# Patient Record
Sex: Female | Born: 1970 | Race: White | Hispanic: No | Marital: Single | Smoking: Never smoker
Health system: Southern US, Community
[De-identification: ages and names within clinical notes are randomized; demographics above are authoritative.]

## PROBLEM LIST (undated history)

## (undated) ENCOUNTER — Ambulatory Visit (HOSPITAL_COMMUNITY): Admission: EM | Source: Home / Self Care

## (undated) VITALS — BP 117/81 | HR 92 | Temp 98.1°F | Resp 19 | Ht 70.0 in | Wt 213.0 lb

## (undated) DIAGNOSIS — H269 Unspecified cataract: Secondary | ICD-10-CM

## (undated) DIAGNOSIS — F609 Personality disorder, unspecified: Secondary | ICD-10-CM

## (undated) DIAGNOSIS — R079 Chest pain, unspecified: Secondary | ICD-10-CM

## (undated) DIAGNOSIS — G894 Chronic pain syndrome: Secondary | ICD-10-CM

## (undated) DIAGNOSIS — I10 Essential (primary) hypertension: Secondary | ICD-10-CM

## (undated) DIAGNOSIS — L309 Dermatitis, unspecified: Secondary | ICD-10-CM

## (undated) DIAGNOSIS — M25519 Pain in unspecified shoulder: Secondary | ICD-10-CM

## (undated) DIAGNOSIS — Z87820 Personal history of traumatic brain injury: Secondary | ICD-10-CM

## (undated) DIAGNOSIS — F319 Bipolar disorder, unspecified: Secondary | ICD-10-CM

## (undated) DIAGNOSIS — R270 Ataxia, unspecified: Secondary | ICD-10-CM

## (undated) DIAGNOSIS — M1990 Unspecified osteoarthritis, unspecified site: Secondary | ICD-10-CM

## (undated) DIAGNOSIS — M2140 Flat foot [pes planus] (acquired), unspecified foot: Secondary | ICD-10-CM

## (undated) DIAGNOSIS — R5383 Other fatigue: Secondary | ICD-10-CM

## (undated) DIAGNOSIS — Z800 Family history of malignant neoplasm of digestive organs: Secondary | ICD-10-CM

## (undated) DIAGNOSIS — R0602 Shortness of breath: Secondary | ICD-10-CM

## (undated) DIAGNOSIS — M549 Dorsalgia, unspecified: Secondary | ICD-10-CM

## (undated) DIAGNOSIS — M6740 Ganglion, unspecified site: Secondary | ICD-10-CM

## (undated) DIAGNOSIS — M7660 Achilles tendinitis, unspecified leg: Secondary | ICD-10-CM

## (undated) DIAGNOSIS — Z803 Family history of malignant neoplasm of breast: Secondary | ICD-10-CM

## (undated) DIAGNOSIS — E221 Hyperprolactinemia: Secondary | ICD-10-CM

## (undated) DIAGNOSIS — T7840XA Allergy, unspecified, initial encounter: Secondary | ICD-10-CM

## (undated) DIAGNOSIS — R42 Dizziness and giddiness: Secondary | ICD-10-CM

## (undated) DIAGNOSIS — F900 Attention-deficit hyperactivity disorder, predominantly inattentive type: Secondary | ICD-10-CM

## (undated) DIAGNOSIS — F909 Attention-deficit hyperactivity disorder, unspecified type: Secondary | ICD-10-CM

## (undated) DIAGNOSIS — M5430 Sciatica, unspecified side: Secondary | ICD-10-CM

## (undated) DIAGNOSIS — J302 Other seasonal allergic rhinitis: Secondary | ICD-10-CM

## (undated) DIAGNOSIS — M2550 Pain in unspecified joint: Secondary | ICD-10-CM

## (undated) DIAGNOSIS — Z8481 Family history of carrier of genetic disease: Secondary | ICD-10-CM

## (undated) HISTORY — DX: Family history of malignant neoplasm of breast: Z80.3

## (undated) HISTORY — DX: Personality disorder, unspecified: F60.9

## (undated) HISTORY — DX: Pain in unspecified joint: M25.50

## (undated) HISTORY — DX: Bipolar disorder, unspecified: F31.9

## (undated) HISTORY — DX: Achilles tendinitis, unspecified leg: M76.60

## (undated) HISTORY — DX: Dorsalgia, unspecified: M54.9

## (undated) HISTORY — DX: Other fatigue: R53.83

## (undated) HISTORY — DX: Attention-deficit hyperactivity disorder, unspecified type: F90.9

## (undated) HISTORY — DX: Family history of malignant neoplasm of digestive organs: Z80.0

## (undated) HISTORY — DX: Family history of carrier of genetic disease: Z84.81

## (undated) HISTORY — DX: Unspecified osteoarthritis, unspecified site: M19.90

## (undated) HISTORY — DX: Chest pain, unspecified: R07.9

## (undated) HISTORY — DX: Flat foot (pes planus) (acquired), unspecified foot: M21.40

## (undated) HISTORY — DX: Dizziness and giddiness: R42

## (undated) HISTORY — DX: Dermatitis, unspecified: L30.9

## (undated) HISTORY — DX: Essential (primary) hypertension: I10

## (undated) HISTORY — DX: Personal history of traumatic brain injury: Z87.820

## (undated) HISTORY — DX: Hyperprolactinemia: E22.1

## (undated) HISTORY — DX: Ataxia, unspecified: R27.0

## (undated) HISTORY — DX: Shortness of breath: R06.02

## (undated) HISTORY — DX: Allergy, unspecified, initial encounter: T78.40XA

## (undated) HISTORY — DX: Ganglion, unspecified site: M67.40

## (undated) HISTORY — DX: Attention-deficit hyperactivity disorder, predominantly inattentive type: F90.0

## (undated) HISTORY — DX: Sciatica, unspecified side: M54.30

## (undated) HISTORY — PX: OTHER SURGICAL HISTORY: SHX169

## (undated) HISTORY — DX: Chronic pain syndrome: G89.4

## (undated) HISTORY — DX: Pain in unspecified shoulder: M25.519

## (undated) HISTORY — DX: Other seasonal allergic rhinitis: J30.2

---
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
## 1998-06-01 ENCOUNTER — Inpatient Hospital Stay (HOSPITAL_COMMUNITY): Admission: EM | Admit: 1998-06-01 | Discharge: 1998-06-11 | Payer: Self-pay | Admitting: Emergency Medicine

## 1998-06-04 ENCOUNTER — Encounter: Payer: Self-pay | Admitting: Internal Medicine

## 1998-06-14 ENCOUNTER — Encounter (HOSPITAL_COMMUNITY): Admission: RE | Admit: 1998-06-14 | Discharge: 1998-06-23 | Payer: Self-pay

## 1998-07-30 ENCOUNTER — Inpatient Hospital Stay (HOSPITAL_COMMUNITY): Admission: EM | Admit: 1998-07-30 | Discharge: 1998-08-10 | Payer: Self-pay | Admitting: *Deleted

## 1998-09-13 ENCOUNTER — Ambulatory Visit (HOSPITAL_COMMUNITY): Admission: RE | Admit: 1998-09-13 | Discharge: 1998-09-13 | Payer: Self-pay | Admitting: Family Medicine

## 1999-02-14 ENCOUNTER — Encounter (HOSPITAL_COMMUNITY): Admission: RE | Admit: 1999-02-14 | Discharge: 1999-05-15 | Payer: Self-pay | Admitting: Neurology

## 1999-04-07 ENCOUNTER — Emergency Department (HOSPITAL_COMMUNITY): Admission: EM | Admit: 1999-04-07 | Discharge: 1999-04-07 | Payer: Self-pay | Admitting: Emergency Medicine

## 1999-04-15 ENCOUNTER — Encounter: Admission: RE | Admit: 1999-04-15 | Discharge: 1999-04-15 | Payer: Self-pay | Admitting: Family Medicine

## 1999-04-15 ENCOUNTER — Encounter: Payer: Self-pay | Admitting: Family Medicine

## 1999-07-04 ENCOUNTER — Emergency Department (HOSPITAL_COMMUNITY): Admission: EM | Admit: 1999-07-04 | Discharge: 1999-07-04 | Payer: Self-pay | Admitting: Emergency Medicine

## 2000-09-17 ENCOUNTER — Encounter: Payer: Self-pay | Admitting: Family Medicine

## 2000-09-17 ENCOUNTER — Encounter: Admission: RE | Admit: 2000-09-17 | Discharge: 2000-09-17 | Payer: Self-pay | Admitting: Family Medicine

## 2001-03-15 ENCOUNTER — Encounter: Payer: Self-pay | Admitting: Family Medicine

## 2001-03-15 ENCOUNTER — Encounter: Admission: RE | Admit: 2001-03-15 | Discharge: 2001-03-15 | Payer: Self-pay | Admitting: Family Medicine

## 2003-05-05 ENCOUNTER — Other Ambulatory Visit: Admission: RE | Admit: 2003-05-05 | Discharge: 2003-05-05 | Payer: Self-pay | Admitting: Family Medicine

## 2003-11-25 ENCOUNTER — Emergency Department (HOSPITAL_COMMUNITY): Admission: EM | Admit: 2003-11-25 | Discharge: 2003-11-25 | Payer: Self-pay | Admitting: Emergency Medicine

## 2004-08-27 ENCOUNTER — Ambulatory Visit: Payer: Self-pay | Admitting: Psychiatry

## 2004-08-27 ENCOUNTER — Inpatient Hospital Stay (HOSPITAL_COMMUNITY): Admission: RE | Admit: 2004-08-27 | Discharge: 2004-08-31 | Payer: Self-pay | Admitting: Psychiatry

## 2004-11-24 ENCOUNTER — Inpatient Hospital Stay (HOSPITAL_COMMUNITY): Admission: RE | Admit: 2004-11-24 | Discharge: 2004-11-30 | Payer: Self-pay | Admitting: Psychiatry

## 2004-11-25 ENCOUNTER — Ambulatory Visit: Payer: Self-pay | Admitting: Psychiatry

## 2004-12-06 ENCOUNTER — Inpatient Hospital Stay (HOSPITAL_COMMUNITY): Admission: RE | Admit: 2004-12-06 | Discharge: 2004-12-12 | Payer: Self-pay | Admitting: Psychiatry

## 2004-12-07 ENCOUNTER — Ambulatory Visit: Payer: Self-pay | Admitting: Psychiatry

## 2004-12-16 ENCOUNTER — Emergency Department (HOSPITAL_COMMUNITY): Admission: EM | Admit: 2004-12-16 | Discharge: 2004-12-17 | Payer: Self-pay | Admitting: Emergency Medicine

## 2004-12-28 ENCOUNTER — Inpatient Hospital Stay (HOSPITAL_COMMUNITY): Admission: RE | Admit: 2004-12-28 | Discharge: 2005-01-02 | Payer: Self-pay | Admitting: Psychiatry

## 2004-12-30 ENCOUNTER — Ambulatory Visit: Payer: Self-pay | Admitting: Psychiatry

## 2005-11-27 ENCOUNTER — Inpatient Hospital Stay (HOSPITAL_COMMUNITY): Admission: RE | Admit: 2005-11-27 | Discharge: 2005-12-01 | Payer: Self-pay | Admitting: Psychiatry

## 2005-11-27 ENCOUNTER — Ambulatory Visit: Payer: Self-pay | Admitting: Psychiatry

## 2005-11-27 ENCOUNTER — Emergency Department (HOSPITAL_COMMUNITY): Admission: EM | Admit: 2005-11-27 | Discharge: 2005-11-27 | Payer: Self-pay | Admitting: Emergency Medicine

## 2005-12-27 ENCOUNTER — Ambulatory Visit (HOSPITAL_COMMUNITY): Payer: Self-pay | Admitting: Psychiatry

## 2006-02-06 ENCOUNTER — Ambulatory Visit (HOSPITAL_BASED_OUTPATIENT_CLINIC_OR_DEPARTMENT_OTHER): Admission: RE | Admit: 2006-02-06 | Discharge: 2006-02-06 | Payer: Self-pay | Admitting: Neurology

## 2006-03-07 ENCOUNTER — Ambulatory Visit (HOSPITAL_COMMUNITY): Payer: Self-pay | Admitting: Psychiatry

## 2006-04-11 ENCOUNTER — Ambulatory Visit (HOSPITAL_COMMUNITY): Payer: Self-pay | Admitting: Psychiatry

## 2006-05-09 ENCOUNTER — Ambulatory Visit (HOSPITAL_COMMUNITY): Payer: Self-pay | Admitting: Psychiatry

## 2006-06-13 ENCOUNTER — Ambulatory Visit (HOSPITAL_COMMUNITY): Payer: Self-pay | Admitting: Psychiatry

## 2006-06-29 ENCOUNTER — Ambulatory Visit: Payer: Self-pay | Admitting: Psychiatry

## 2006-06-29 ENCOUNTER — Inpatient Hospital Stay (HOSPITAL_COMMUNITY): Admission: RE | Admit: 2006-06-29 | Discharge: 2006-07-03 | Payer: Self-pay | Admitting: Psychiatry

## 2006-07-18 ENCOUNTER — Ambulatory Visit (HOSPITAL_COMMUNITY): Payer: Self-pay | Admitting: Psychiatry

## 2006-07-20 ENCOUNTER — Inpatient Hospital Stay (HOSPITAL_COMMUNITY): Admission: AD | Admit: 2006-07-20 | Discharge: 2006-07-25 | Payer: Self-pay | Admitting: Psychiatry

## 2006-07-28 ENCOUNTER — Inpatient Hospital Stay (HOSPITAL_COMMUNITY): Admission: AD | Admit: 2006-07-28 | Discharge: 2006-08-03 | Payer: Self-pay | Admitting: Psychiatry

## 2006-08-23 ENCOUNTER — Ambulatory Visit: Payer: Self-pay | Admitting: Psychiatry

## 2006-08-23 ENCOUNTER — Inpatient Hospital Stay (HOSPITAL_COMMUNITY): Admission: AD | Admit: 2006-08-23 | Discharge: 2006-08-29 | Payer: Self-pay | Admitting: Psychiatry

## 2006-08-29 ENCOUNTER — Inpatient Hospital Stay: Payer: Self-pay | Admitting: Unknown Physician Specialty

## 2006-09-12 ENCOUNTER — Ambulatory Visit: Payer: Self-pay | Admitting: Unknown Physician Specialty

## 2006-09-26 ENCOUNTER — Inpatient Hospital Stay (HOSPITAL_COMMUNITY): Admission: EM | Admit: 2006-09-26 | Discharge: 2006-10-09 | Payer: Self-pay | Admitting: *Deleted

## 2006-09-26 ENCOUNTER — Emergency Department (HOSPITAL_COMMUNITY): Admission: EM | Admit: 2006-09-26 | Discharge: 2006-09-26 | Payer: Self-pay | Admitting: Emergency Medicine

## 2006-09-28 ENCOUNTER — Ambulatory Visit (HOSPITAL_COMMUNITY): Admission: RE | Admit: 2006-09-28 | Discharge: 2006-09-28 | Payer: Self-pay | Admitting: *Deleted

## 2006-10-01 ENCOUNTER — Ambulatory Visit: Payer: Self-pay | Admitting: Unknown Physician Specialty

## 2006-11-16 ENCOUNTER — Other Ambulatory Visit (HOSPITAL_COMMUNITY): Admission: RE | Admit: 2006-11-16 | Discharge: 2007-02-14 | Payer: Self-pay | Admitting: Psychiatry

## 2006-12-12 ENCOUNTER — Ambulatory Visit (HOSPITAL_COMMUNITY): Payer: Self-pay | Admitting: Psychiatry

## 2007-01-10 ENCOUNTER — Encounter: Admission: RE | Admit: 2007-01-10 | Discharge: 2007-01-10 | Payer: Self-pay | Admitting: *Deleted

## 2007-01-11 ENCOUNTER — Emergency Department (HOSPITAL_COMMUNITY): Admission: EM | Admit: 2007-01-11 | Discharge: 2007-01-11 | Payer: Self-pay | Admitting: Emergency Medicine

## 2007-01-16 ENCOUNTER — Encounter: Admission: RE | Admit: 2007-01-16 | Discharge: 2007-01-16 | Payer: Self-pay | Admitting: *Deleted

## 2007-01-28 ENCOUNTER — Encounter: Admission: RE | Admit: 2007-01-28 | Discharge: 2007-01-28 | Payer: Self-pay | Admitting: *Deleted

## 2007-01-28 ENCOUNTER — Encounter (INDEPENDENT_AMBULATORY_CARE_PROVIDER_SITE_OTHER): Payer: Self-pay | Admitting: Diagnostic Radiology

## 2007-01-30 ENCOUNTER — Encounter (INDEPENDENT_AMBULATORY_CARE_PROVIDER_SITE_OTHER): Payer: Self-pay | Admitting: Interventional Cardiology

## 2007-01-30 ENCOUNTER — Ambulatory Visit (HOSPITAL_COMMUNITY): Admission: RE | Admit: 2007-01-30 | Discharge: 2007-01-30 | Payer: Self-pay | Admitting: Interventional Cardiology

## 2007-01-30 ENCOUNTER — Emergency Department (HOSPITAL_COMMUNITY): Admission: EM | Admit: 2007-01-30 | Discharge: 2007-01-30 | Payer: Self-pay | Admitting: Emergency Medicine

## 2007-02-14 ENCOUNTER — Emergency Department (HOSPITAL_COMMUNITY): Admission: EM | Admit: 2007-02-14 | Discharge: 2007-02-14 | Payer: Self-pay | Admitting: Emergency Medicine

## 2007-02-20 ENCOUNTER — Ambulatory Visit (HOSPITAL_COMMUNITY): Payer: Self-pay | Admitting: Psychiatry

## 2007-02-25 ENCOUNTER — Ambulatory Visit: Payer: Self-pay | Admitting: Psychiatry

## 2007-02-25 ENCOUNTER — Inpatient Hospital Stay (HOSPITAL_COMMUNITY): Admission: AD | Admit: 2007-02-25 | Discharge: 2007-03-07 | Payer: Self-pay | Admitting: Psychiatry

## 2007-03-20 ENCOUNTER — Inpatient Hospital Stay (HOSPITAL_COMMUNITY): Admission: AD | Admit: 2007-03-20 | Discharge: 2007-04-01 | Payer: Self-pay | Admitting: Psychiatry

## 2007-04-04 ENCOUNTER — Emergency Department (HOSPITAL_COMMUNITY): Admission: EM | Admit: 2007-04-04 | Discharge: 2007-04-04 | Payer: Self-pay | Admitting: Family Medicine

## 2007-04-24 ENCOUNTER — Ambulatory Visit: Payer: Self-pay | Admitting: Internal Medicine

## 2007-04-25 ENCOUNTER — Ambulatory Visit: Payer: Self-pay | Admitting: *Deleted

## 2007-04-26 ENCOUNTER — Emergency Department (HOSPITAL_COMMUNITY): Admission: EM | Admit: 2007-04-26 | Discharge: 2007-04-26 | Payer: Self-pay | Admitting: Family Medicine

## 2007-05-01 ENCOUNTER — Ambulatory Visit (HOSPITAL_COMMUNITY): Payer: Self-pay | Admitting: Psychiatry

## 2007-05-13 ENCOUNTER — Encounter: Admission: RE | Admit: 2007-05-13 | Discharge: 2007-06-14 | Payer: Self-pay | Admitting: Family Medicine

## 2007-06-04 ENCOUNTER — Emergency Department (HOSPITAL_COMMUNITY): Admission: EM | Admit: 2007-06-04 | Discharge: 2007-06-04 | Payer: Self-pay | Admitting: Emergency Medicine

## 2007-06-05 ENCOUNTER — Ambulatory Visit (HOSPITAL_COMMUNITY): Payer: Self-pay | Admitting: Psychiatry

## 2007-07-09 ENCOUNTER — Encounter (INDEPENDENT_AMBULATORY_CARE_PROVIDER_SITE_OTHER): Payer: Self-pay | Admitting: Nurse Practitioner

## 2007-07-09 ENCOUNTER — Ambulatory Visit: Payer: Self-pay | Admitting: Internal Medicine

## 2007-07-09 LAB — CONVERTED CEMR LAB
ALT: 18 units/L (ref 0–35)
AST: 18 units/L (ref 0–37)
Albumin: 4.4 g/dL (ref 3.5–5.2)
Alkaline Phosphatase: 80 units/L (ref 39–117)
BUN: 8 mg/dL (ref 6–23)
Basophils Absolute: 0 10*3/uL (ref 0.0–0.1)
Basophils Relative: 1 % (ref 0–1)
CO2: 26 meq/L (ref 19–32)
Calcium: 9.2 mg/dL (ref 8.4–10.5)
Chloride: 102 meq/L (ref 96–112)
Creatinine, Ser: 0.8 mg/dL (ref 0.40–1.20)
Eosinophils Absolute: 0.2 10*3/uL (ref 0.0–0.7)
Eosinophils Relative: 4 % (ref 0–5)
Glucose, Bld: 133 mg/dL — ABNORMAL HIGH (ref 70–99)
HCT: 41.2 % (ref 36.0–46.0)
Hemoglobin: 13.3 g/dL (ref 12.0–15.0)
Lymphocytes Relative: 33 % (ref 12–46)
Lymphs Abs: 1.7 10*3/uL (ref 0.7–4.0)
MCHC: 32.3 g/dL (ref 30.0–36.0)
MCV: 93.2 fL (ref 78.0–100.0)
Monocytes Absolute: 0.5 10*3/uL (ref 0.1–1.0)
Monocytes Relative: 10 % (ref 3–12)
Neutro Abs: 2.7 10*3/uL (ref 1.7–7.7)
Neutrophils Relative %: 52 % (ref 43–77)
Platelets: 237 10*3/uL (ref 150–400)
Potassium: 4.5 meq/L (ref 3.5–5.3)
RBC: 4.42 M/uL (ref 3.87–5.11)
RDW: 13 % (ref 11.5–15.5)
Sodium: 139 meq/L (ref 135–145)
Total Bilirubin: 0.3 mg/dL (ref 0.3–1.2)
Total Protein: 6.5 g/dL (ref 6.0–8.3)
WBC: 5.2 10*3/uL (ref 4.0–10.5)

## 2007-07-10 ENCOUNTER — Ambulatory Visit (HOSPITAL_COMMUNITY): Payer: Self-pay | Admitting: Psychiatry

## 2007-07-11 ENCOUNTER — Encounter (INDEPENDENT_AMBULATORY_CARE_PROVIDER_SITE_OTHER): Payer: Self-pay | Admitting: Nurse Practitioner

## 2007-07-11 LAB — CONVERTED CEMR LAB: Hgb A1c MFr Bld: 5.3 % (ref 4.6–6.1)

## 2007-07-18 ENCOUNTER — Encounter: Admission: RE | Admit: 2007-07-18 | Discharge: 2007-07-18 | Payer: Self-pay | Admitting: Family Medicine

## 2007-08-07 ENCOUNTER — Ambulatory Visit (HOSPITAL_COMMUNITY): Payer: Self-pay | Admitting: Psychiatry

## 2007-09-04 ENCOUNTER — Ambulatory Visit (HOSPITAL_COMMUNITY): Payer: Self-pay | Admitting: Psychiatry

## 2008-04-29 ENCOUNTER — Ambulatory Visit: Payer: Self-pay | Admitting: *Deleted

## 2008-04-29 ENCOUNTER — Inpatient Hospital Stay (HOSPITAL_COMMUNITY): Admission: RE | Admit: 2008-04-29 | Discharge: 2008-05-06 | Payer: Self-pay | Admitting: *Deleted

## 2008-05-15 ENCOUNTER — Encounter: Admission: RE | Admit: 2008-05-15 | Discharge: 2008-05-15 | Payer: Self-pay | Admitting: Family Medicine

## 2008-05-22 ENCOUNTER — Encounter: Admission: RE | Admit: 2008-05-22 | Discharge: 2008-05-22 | Payer: Self-pay | Admitting: Family Medicine

## 2008-06-03 ENCOUNTER — Ambulatory Visit: Payer: Self-pay | Admitting: Sports Medicine

## 2008-06-03 DIAGNOSIS — M7660 Achilles tendinitis, unspecified leg: Secondary | ICD-10-CM | POA: Insufficient documentation

## 2008-06-05 ENCOUNTER — Telehealth (INDEPENDENT_AMBULATORY_CARE_PROVIDER_SITE_OTHER): Payer: Self-pay | Admitting: *Deleted

## 2008-06-06 ENCOUNTER — Emergency Department (HOSPITAL_COMMUNITY): Admission: EM | Admit: 2008-06-06 | Discharge: 2008-06-06 | Payer: Self-pay | Admitting: Emergency Medicine

## 2008-06-09 ENCOUNTER — Ambulatory Visit: Payer: Self-pay | Admitting: Sports Medicine

## 2008-06-09 DIAGNOSIS — F319 Bipolar disorder, unspecified: Secondary | ICD-10-CM | POA: Insufficient documentation

## 2008-07-01 ENCOUNTER — Ambulatory Visit: Payer: Self-pay | Admitting: Sports Medicine

## 2008-07-01 DIAGNOSIS — M216X9 Other acquired deformities of unspecified foot: Secondary | ICD-10-CM | POA: Insufficient documentation

## 2008-07-01 DIAGNOSIS — M2140 Flat foot [pes planus] (acquired), unspecified foot: Secondary | ICD-10-CM | POA: Insufficient documentation

## 2008-08-26 ENCOUNTER — Ambulatory Visit: Payer: Self-pay | Admitting: Sports Medicine

## 2008-10-09 ENCOUNTER — Inpatient Hospital Stay (HOSPITAL_COMMUNITY): Admission: AD | Admit: 2008-10-09 | Discharge: 2008-10-15 | Payer: Self-pay | Admitting: *Deleted

## 2008-10-09 ENCOUNTER — Ambulatory Visit: Payer: Self-pay | Admitting: *Deleted

## 2008-10-27 ENCOUNTER — Ambulatory Visit: Payer: Self-pay | Admitting: Sports Medicine

## 2008-12-10 ENCOUNTER — Ambulatory Visit: Payer: Self-pay | Admitting: Sports Medicine

## 2008-12-23 ENCOUNTER — Ambulatory Visit: Payer: Self-pay | Admitting: Gynecology

## 2008-12-28 ENCOUNTER — Emergency Department (HOSPITAL_COMMUNITY): Admission: EM | Admit: 2008-12-28 | Discharge: 2008-12-28 | Payer: Self-pay | Admitting: Emergency Medicine

## 2008-12-30 ENCOUNTER — Ambulatory Visit (HOSPITAL_COMMUNITY): Payer: Self-pay | Admitting: Psychiatry

## 2009-01-07 ENCOUNTER — Ambulatory Visit: Payer: Self-pay | Admitting: Gynecology

## 2009-01-11 ENCOUNTER — Ambulatory Visit: Payer: Self-pay | Admitting: Gynecology

## 2009-01-11 ENCOUNTER — Ambulatory Visit (HOSPITAL_BASED_OUTPATIENT_CLINIC_OR_DEPARTMENT_OTHER): Admission: RE | Admit: 2009-01-11 | Discharge: 2009-01-11 | Payer: Self-pay | Admitting: Gynecology

## 2009-01-18 ENCOUNTER — Ambulatory Visit: Payer: Self-pay | Admitting: Gynecology

## 2009-01-19 ENCOUNTER — Ambulatory Visit: Payer: Self-pay | Admitting: Gynecology

## 2009-01-30 HISTORY — PX: GANGLION CYST EXCISION: SHX1691

## 2009-02-02 ENCOUNTER — Encounter: Admission: RE | Admit: 2009-02-02 | Discharge: 2009-02-02 | Payer: Self-pay | Admitting: Gynecology

## 2009-02-03 ENCOUNTER — Ambulatory Visit: Payer: Self-pay | Admitting: Gynecology

## 2009-04-01 ENCOUNTER — Emergency Department (HOSPITAL_COMMUNITY): Admission: EM | Admit: 2009-04-01 | Discharge: 2009-04-01 | Payer: Self-pay | Admitting: Emergency Medicine

## 2009-04-01 ENCOUNTER — Ambulatory Visit: Payer: Self-pay | Admitting: Gynecology

## 2009-04-06 ENCOUNTER — Ambulatory Visit: Payer: Self-pay | Admitting: Sports Medicine

## 2009-04-08 ENCOUNTER — Inpatient Hospital Stay (HOSPITAL_COMMUNITY): Admission: RE | Admit: 2009-04-08 | Discharge: 2009-04-16 | Payer: Self-pay | Admitting: Psychiatry

## 2009-04-08 ENCOUNTER — Ambulatory Visit: Payer: Self-pay | Admitting: Psychiatry

## 2009-04-27 ENCOUNTER — Ambulatory Visit: Payer: Self-pay | Admitting: Sports Medicine

## 2009-05-26 ENCOUNTER — Ambulatory Visit: Payer: Self-pay | Admitting: Sports Medicine

## 2009-06-01 ENCOUNTER — Ambulatory Visit: Payer: Self-pay | Admitting: Gynecology

## 2009-06-03 ENCOUNTER — Ambulatory Visit: Payer: Self-pay | Admitting: Psychiatry

## 2009-06-03 ENCOUNTER — Inpatient Hospital Stay (HOSPITAL_COMMUNITY): Admission: RE | Admit: 2009-06-03 | Discharge: 2009-06-14 | Payer: Self-pay | Admitting: Psychiatry

## 2009-06-10 ENCOUNTER — Emergency Department (HOSPITAL_COMMUNITY): Admission: EM | Admit: 2009-06-10 | Discharge: 2009-06-10 | Payer: Self-pay | Admitting: Emergency Medicine

## 2009-06-16 ENCOUNTER — Other Ambulatory Visit (HOSPITAL_COMMUNITY): Admission: RE | Admit: 2009-06-16 | Discharge: 2009-06-24 | Payer: Self-pay | Admitting: Psychiatry

## 2009-06-18 ENCOUNTER — Emergency Department (HOSPITAL_COMMUNITY): Admission: EM | Admit: 2009-06-18 | Discharge: 2009-06-19 | Payer: Self-pay | Admitting: Emergency Medicine

## 2009-06-19 ENCOUNTER — Inpatient Hospital Stay (HOSPITAL_COMMUNITY): Admission: RE | Admit: 2009-06-19 | Discharge: 2009-06-22 | Payer: Self-pay | Admitting: Psychiatry

## 2009-06-24 ENCOUNTER — Ambulatory Visit: Payer: Self-pay | Admitting: Gynecology

## 2009-06-25 ENCOUNTER — Encounter: Admission: RE | Admit: 2009-06-25 | Discharge: 2009-06-25 | Payer: Self-pay | Admitting: Gynecology

## 2009-07-09 ENCOUNTER — Ambulatory Visit: Payer: Self-pay | Admitting: Family Medicine

## 2009-07-21 ENCOUNTER — Ambulatory Visit (HOSPITAL_COMMUNITY): Payer: Self-pay | Admitting: Psychiatry

## 2009-07-28 ENCOUNTER — Ambulatory Visit (HOSPITAL_COMMUNITY): Payer: Self-pay | Admitting: Psychiatry

## 2009-07-30 ENCOUNTER — Ambulatory Visit: Payer: Self-pay | Admitting: Family Medicine

## 2009-07-30 DIAGNOSIS — S93409A Sprain of unspecified ligament of unspecified ankle, initial encounter: Secondary | ICD-10-CM | POA: Insufficient documentation

## 2009-08-04 ENCOUNTER — Ambulatory Visit (HOSPITAL_COMMUNITY): Payer: Self-pay | Admitting: Psychiatry

## 2009-08-05 ENCOUNTER — Emergency Department (HOSPITAL_COMMUNITY): Admission: EM | Admit: 2009-08-05 | Discharge: 2009-08-06 | Payer: Self-pay | Admitting: Emergency Medicine

## 2009-08-05 ENCOUNTER — Ambulatory Visit (HOSPITAL_COMMUNITY): Admission: RE | Admit: 2009-08-05 | Discharge: 2009-08-05 | Payer: Self-pay | Admitting: Psychiatry

## 2009-08-06 ENCOUNTER — Ambulatory Visit: Payer: Self-pay | Admitting: Psychiatry

## 2009-08-06 ENCOUNTER — Inpatient Hospital Stay (HOSPITAL_COMMUNITY): Admission: AD | Admit: 2009-08-06 | Discharge: 2009-08-23 | Payer: Self-pay | Admitting: Psychiatry

## 2009-08-26 ENCOUNTER — Encounter: Admission: RE | Admit: 2009-08-26 | Discharge: 2009-08-26 | Payer: Self-pay | Admitting: Family Medicine

## 2009-08-27 ENCOUNTER — Ambulatory Visit: Payer: Self-pay | Admitting: Family Medicine

## 2009-08-27 DIAGNOSIS — M25539 Pain in unspecified wrist: Secondary | ICD-10-CM | POA: Insufficient documentation

## 2009-09-07 ENCOUNTER — Ambulatory Visit: Payer: Self-pay | Admitting: Family Medicine

## 2009-09-08 ENCOUNTER — Ambulatory Visit: Payer: Self-pay | Admitting: Gynecology

## 2009-09-09 ENCOUNTER — Encounter: Admission: RE | Admit: 2009-09-09 | Discharge: 2009-09-09 | Payer: Self-pay | Admitting: Family Medicine

## 2009-09-24 ENCOUNTER — Ambulatory Visit: Payer: Self-pay | Admitting: Gynecology

## 2009-09-27 ENCOUNTER — Ambulatory Visit: Payer: Self-pay | Admitting: Family Medicine

## 2009-09-27 DIAGNOSIS — L2089 Other atopic dermatitis: Secondary | ICD-10-CM | POA: Insufficient documentation

## 2009-09-28 ENCOUNTER — Encounter: Payer: Self-pay | Admitting: Family Medicine

## 2009-09-29 DIAGNOSIS — M6740 Ganglion, unspecified site: Secondary | ICD-10-CM | POA: Insufficient documentation

## 2009-09-29 HISTORY — DX: Ganglion, unspecified site: M67.40

## 2009-10-11 ENCOUNTER — Encounter: Payer: Self-pay | Admitting: Family Medicine

## 2009-10-11 ENCOUNTER — Emergency Department (HOSPITAL_COMMUNITY): Admission: EM | Admit: 2009-10-11 | Discharge: 2009-10-11 | Payer: Self-pay | Admitting: Emergency Medicine

## 2009-10-15 ENCOUNTER — Ambulatory Visit: Payer: Self-pay | Admitting: Family Medicine

## 2009-11-17 ENCOUNTER — Encounter
Admission: RE | Admit: 2009-11-17 | Discharge: 2010-01-19 | Payer: Self-pay | Source: Home / Self Care | Attending: Neurology | Admitting: Neurology

## 2009-11-29 ENCOUNTER — Ambulatory Visit: Payer: Self-pay | Admitting: Family Medicine

## 2009-12-06 ENCOUNTER — Ambulatory Visit: Payer: Self-pay | Admitting: Women's Health

## 2009-12-22 ENCOUNTER — Ambulatory Visit (HOSPITAL_COMMUNITY)
Admission: RE | Admit: 2009-12-22 | Discharge: 2009-12-22 | Payer: Self-pay | Source: Home / Self Care | Admitting: Surgery

## 2010-01-28 ENCOUNTER — Ambulatory Visit
Admission: RE | Admit: 2010-01-28 | Discharge: 2010-01-28 | Payer: Self-pay | Source: Home / Self Care | Attending: Women's Health | Admitting: Women's Health

## 2010-01-31 ENCOUNTER — Ambulatory Visit
Admission: RE | Admit: 2010-01-31 | Discharge: 2010-01-31 | Payer: Self-pay | Source: Home / Self Care | Attending: Gynecology | Admitting: Gynecology

## 2010-01-31 ENCOUNTER — Other Ambulatory Visit
Admission: RE | Admit: 2010-01-31 | Discharge: 2010-01-31 | Payer: Self-pay | Source: Home / Self Care | Admitting: Gynecology

## 2010-02-04 ENCOUNTER — Ambulatory Visit
Admission: RE | Admit: 2010-02-04 | Discharge: 2010-02-04 | Payer: Self-pay | Source: Home / Self Care | Attending: Family Medicine | Admitting: Family Medicine

## 2010-02-04 DIAGNOSIS — M25569 Pain in unspecified knee: Secondary | ICD-10-CM | POA: Insufficient documentation

## 2010-02-07 ENCOUNTER — Emergency Department (HOSPITAL_COMMUNITY)
Admission: EM | Admit: 2010-02-07 | Discharge: 2010-02-08 | Payer: Self-pay | Source: Home / Self Care | Admitting: Emergency Medicine

## 2010-02-09 ENCOUNTER — Ambulatory Visit
Admission: RE | Admit: 2010-02-09 | Discharge: 2010-02-09 | Payer: Self-pay | Source: Home / Self Care | Attending: Gynecology | Admitting: Gynecology

## 2010-02-14 LAB — BASIC METABOLIC PANEL
BUN: 12 mg/dL (ref 6–23)
CO2: 26 mEq/L (ref 19–32)
Calcium: 8.8 mg/dL (ref 8.4–10.5)
Chloride: 105 mEq/L (ref 96–112)
Creatinine, Ser: 1.03 mg/dL (ref 0.4–1.2)
GFR calc Af Amer: >60 mL/min (ref >60)
GFR calc non Af Amer: 59 mL/min — ABNORMAL LOW (ref >60)
Glucose, Bld: 106 mg/dL — ABNORMAL HIGH (ref 70–99)
Potassium: 3.8 mEq/L (ref 3.5–5.1)
Sodium: 141 mEq/L (ref 135–145)

## 2010-02-14 LAB — CBC
HCT: 38.7 % (ref 36.0–46.0)
Hemoglobin: 13.1 g/dL (ref 12.0–15.0)
MCH: 31.6 pg (ref 26.0–34.0)
MCHC: 33.9 g/dL (ref 30.0–36.0)
MCV: 93.3 fL (ref 78.0–100.0)
Platelets: 171 10*3/uL (ref 150–400)
RBC: 4.15 MIL/uL (ref 3.87–5.11)
RDW: 12.4 % (ref 11.5–15.5)
WBC: 7.6 10*3/uL (ref 4.0–10.5)

## 2010-02-14 LAB — WET PREP, GENITAL
Clue Cells Wet Prep HPF POC: NONE SEEN
Trich, Wet Prep: NONE SEEN
WBC, Wet Prep HPF POC: NONE SEEN
Yeast Wet Prep HPF POC: NONE SEEN

## 2010-02-14 LAB — DIFFERENTIAL
Basophils Absolute: 0 10*3/uL (ref 0.0–0.1)
Basophils Relative: 0 % (ref 0–1)
Eosinophils Absolute: 0.1 10*3/uL (ref 0.0–0.7)
Eosinophils Relative: 1 % (ref 0–5)
Lymphocytes Relative: 41 % (ref 12–46)
Lymphs Abs: 3.1 10*3/uL (ref 0.7–4.0)
Monocytes Absolute: 0.8 10*3/uL (ref 0.1–1.0)
Monocytes Relative: 10 % (ref 3–12)
Neutro Abs: 3.7 10*3/uL (ref 1.7–7.7)
Neutrophils Relative %: 48 % (ref 43–77)

## 2010-02-14 LAB — URINALYSIS, ROUTINE W REFLEX MICROSCOPIC
Bilirubin Urine: NEGATIVE
Hgb urine dipstick: NEGATIVE
Nitrite: NEGATIVE
Protein, ur: NEGATIVE mg/dL
Specific Gravity, Urine: 1.029 (ref 1.005–1.030)
Urine Glucose, Fasting: NEGATIVE mg/dL
Urobilinogen, UA: 0.2 mg/dL (ref 0.0–1.0)
pH: 7 (ref 5.0–8.0)

## 2010-02-14 LAB — GC/CHLAMYDIA PROBE AMP, GENITAL
Chlamydia, DNA Probe: NEGATIVE
GC Probe Amp, Genital: NEGATIVE

## 2010-02-14 LAB — URINE MICROSCOPIC-ADD ON

## 2010-02-14 LAB — POCT PREGNANCY, URINE: Preg Test, Ur: NEGATIVE

## 2010-02-15 ENCOUNTER — Emergency Department (HOSPITAL_COMMUNITY)
Admission: EM | Admit: 2010-02-15 | Discharge: 2010-02-15 | Payer: Self-pay | Source: Home / Self Care | Admitting: Emergency Medicine

## 2010-02-16 ENCOUNTER — Ambulatory Visit
Admission: RE | Admit: 2010-02-16 | Discharge: 2010-02-16 | Payer: Self-pay | Source: Home / Self Care | Attending: Gynecology | Admitting: Gynecology

## 2010-02-16 LAB — CBC
HCT: 38.9 % (ref 36.0–46.0)
Hemoglobin: 13.2 g/dL (ref 12.0–15.0)
MCH: 30.8 pg (ref 26.0–34.0)
MCHC: 33.9 g/dL (ref 30.0–36.0)
MCV: 90.9 fL (ref 78.0–100.0)
Platelets: 174 10*3/uL (ref 150–400)
RBC: 4.28 MIL/uL (ref 3.87–5.11)
RDW: 12.2 % (ref 11.5–15.5)
WBC: 7.7 10*3/uL (ref 4.0–10.5)

## 2010-02-16 LAB — URINALYSIS, ROUTINE W REFLEX MICROSCOPIC
Bilirubin Urine: NEGATIVE
Hgb urine dipstick: NEGATIVE
Ketones, ur: NEGATIVE mg/dL
Nitrite: NEGATIVE
Protein, ur: NEGATIVE mg/dL
Specific Gravity, Urine: 1.01 (ref 1.005–1.030)
Urine Glucose, Fasting: NEGATIVE mg/dL
Urobilinogen, UA: 0.2 mg/dL (ref 0.0–1.0)
pH: 7 (ref 5.0–8.0)

## 2010-02-16 LAB — DIFFERENTIAL
Basophils Absolute: 0 10*3/uL (ref 0.0–0.1)
Basophils Relative: 0 % (ref 0–1)
Eosinophils Absolute: 0.2 10*3/uL (ref 0.0–0.7)
Eosinophils Relative: 2 % (ref 0–5)
Lymphocytes Relative: 34 % (ref 12–46)
Lymphs Abs: 2.6 10*3/uL (ref 0.7–4.0)
Monocytes Absolute: 0.8 10*3/uL (ref 0.1–1.0)
Monocytes Relative: 10 % (ref 3–12)
Neutro Abs: 4.1 10*3/uL (ref 1.7–7.7)
Neutrophils Relative %: 53 % (ref 43–77)

## 2010-02-16 LAB — BASIC METABOLIC PANEL
BUN: 5 mg/dL — ABNORMAL LOW (ref 6–23)
CO2: 21 mEq/L (ref 19–32)
Calcium: 8.8 mg/dL (ref 8.4–10.5)
Chloride: 109 mEq/L (ref 96–112)
Creatinine, Ser: 1.02 mg/dL (ref 0.4–1.2)
GFR calc Af Amer: >60 mL/min (ref >60)
GFR calc non Af Amer: >60 mL/min (ref >60)
Glucose, Bld: 89 mg/dL (ref 70–99)
Potassium: 3.8 mEq/L (ref 3.5–5.1)
Sodium: 141 mEq/L (ref 135–145)

## 2010-02-16 LAB — POCT CARDIAC MARKERS
CKMB, poc: <1 ng/mL — ABNORMAL LOW (ref 1.0–8.0)
CKMB, poc: <1 ng/mL — ABNORMAL LOW (ref 1.0–8.0)
Myoglobin, poc: 41.5 ng/mL (ref 12–200)
Myoglobin, poc: 37.5 ng/mL (ref 12–200)
Troponin i, poc: <0.05 ng/mL (ref 0.00–0.09)
Troponin i, poc: <0.05 ng/mL (ref 0.00–0.09)

## 2010-02-16 LAB — HEPATIC FUNCTION PANEL
ALT: 21 U/L (ref 0–35)
AST: 23 U/L (ref 0–37)
Albumin: 3.2 g/dL — ABNORMAL LOW (ref 3.5–5.2)
Alkaline Phosphatase: 37 U/L — ABNORMAL LOW (ref 39–117)
Bilirubin, Direct: 0.2 mg/dL (ref 0.0–0.3)
Indirect Bilirubin: 0.4 mg/dL (ref 0.3–0.9)
Total Bilirubin: 0.6 mg/dL (ref 0.3–1.2)
Total Protein: 5.3 g/dL — ABNORMAL LOW (ref 6.0–8.3)

## 2010-02-16 LAB — URINE MICROSCOPIC-ADD ON

## 2010-02-16 LAB — VALPROIC ACID LEVEL: Valproic Acid Lvl: <10 ug/mL — ABNORMAL LOW (ref 50.0–100.0)

## 2010-02-16 LAB — LIPASE, BLOOD: Lipase: 18 U/L (ref 11–59)

## 2010-02-16 LAB — POCT PREGNANCY, URINE: Preg Test, Ur: NEGATIVE

## 2010-02-16 LAB — D-DIMER, QUANTITATIVE: D-Dimer, Quant: <0.22 ug/mL-FEU (ref 0.00–0.48)

## 2010-02-20 ENCOUNTER — Encounter: Payer: Self-pay | Admitting: Gynecology

## 2010-03-03 NOTE — Assessment & Plan Note (Signed)
Summary: fu achilles/jw   Vital Signs:  Patient profile:   40 year old female Pulse rate:   89 / minute BP sitting:   114 / 82  (right arm)  Vitals Entered By: Terese Door (August 26, 2008 10:51 AM) CC: f/u achilles   CC:  f/u achilles.  History of Present Illness: [name] has mostly good days now she can walk to her normal activities and classes without any left AT pain she does the exercises but not daily rare sharp pains in left AT no swelling feels that strength is good orhtotics take pressure off when she wears them as do some of her shoes  worried about involuntary twitching of third toe which sounds like some mm spasm or early cramping after hot weather exposure  Physical Exam  General:  Obese ,in no acute distress; alert,appropriate and cooperative throughout examination Msk:  no palpable tenderness over AT on left she seems to have normal diameter compared with RT able to walk and do heel raise with no limp or pain  repeat US Scan now the tendon has decreased to 0.46 thickness on LT this same area was 0.66 thickness in May no abnormal doppler activity transverse scan shows normal width and tendon structure no tears visualized   Impression & Recommendations:  Problem # 1:  ACHILLES TENDINITIS (ICD-726.71) This has resolved on clinical sxs and also structurally appears to have healed on MSUS  I recommended keeping up some maintanance AT exercises  wear orthotics and good shoes  reck if needed  Problem # 2:  PES PLANUS (ICD-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 Apply 3-4 times a day as needed disp qs 1 month

## 2010-03-03 NOTE — Assessment & Plan Note (Signed)
Summary: FU L ACHILLES PAIN   Vital Signs:  Patient profile:   40 year old female BP sitting:   120 / 84  Vitals Entered By: Lillia Pauls CMA (May 26, 2009 10:06 AM)  History of Present Illness: Reports to f/u left AT. No change since LOV. No routine performance of exercises. Using gel heel cups. Frequently walks [place] campus. Pain worst at the end of the day and on prolonged ambulation. Pain relieved by rest. No recent tearing sensations. Skin rx to voltaren gel and NTG patches; thus dc'ed. No past problems with oral NSAID.  Allergies: 1)  ! Pcn 2)  ! Prednisone 3)  ! Morphine  Physical Exam  General:  Well-developed,well-nourished,in no acute distress; alert,appropriate and cooperative throughout examination Msk:  ANKLES/FEET: Pes planus. Excessive Pronation. Full ROM/strength.  AT/CALVES: Normal Thompson's bilaterally. ttp along mid to distal AT on left with most ttp at retrocalcaneal bursa. No swelling, discoloration, or increased warmth. Normal nv exam.    Impression & Recommendations:  Problem # 1:  ACHILLES TENDINITIS (ICD-726.71) Extensively discussed importance of at least home rehabiliation. Declined formal PT as insurance will not cover PT sessions.  - D/c gel heel cups. - Felt heel wedges in sneakers for ambulatory activities. - Remaining plan per patient instrcutions. - RTC in 4 wks for repeat scan.  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  Complete Medication List: 1)  Risperdal M-tab 1 Mg Tbdp (Risperidone) .... 3mg  by mouth at bedtime and 1mg  bid 2)  Tegretol Xr 200 Mg Xr12h-tab (Carbamazepine) .... 2 tabs by mouth bid 3)  Trazodone Hcl 100 Mg Tabs (Trazodone hcl) .... 2 tabs po qhs 4)  Ambien 10 Mg Tabs (Zolpidem tartrate) .Marland Kitchen.. 1 po qhs 5)  Xanax 0.25 Mg Tabs (Alprazolam) .Marland Kitchen.. 1 tab by mouth bid 6)  Adderall 20 Mg Tabs (Amphetamine-dextroamphetamine) .Marland Kitchen.. 1 tab  po bid 7)  Loestrin 1.5/30 (21) 1.5-30 Mg-mcg Tabs (Norethindrone acet-ethinyl est) .Marland Kitchen.. 1 tab po daily (not sure of dosage) 8)  Cabergoline 0.5 Mg Tabs (Cabergoline) .... 1/2 tab by mouth twice a week 9)  Hydrochlorothiazide 25 Mg Tabs (Hydrochlorothiazide) .Marland Kitchen.. 1 tab po daily 10)  Toprol Xl 100 Mg Xr24h-tab (Metoprolol succinate) .Marland Kitchen.. 1 tab po daily 11)  Depakote Er 500 Mg Xr24h-tab (Divalproex sodium) .... 4 tabs po qhs 12)  Mobic 15 Mg Tabs (Meloxicam) .Marland Kitchen.. 1 tab by mouth daily  Patient Instructions: 1)  Heel Raises - 3 sets of 15 daily. 2)  Pidgeon-toe Heel Raises - 3 sets of 15 daily. 3)  Pidgeon-toe Walking - 10 times daily. 4)  Reverses Walking - 10 times daily. Prescriptions: MOBIC 15 MG TABS (MELOXICAM) 1 tab by mouth daily  #30 x 0   Entered and Authorized by:   Valarie Merino MD   Signed by:   Valarie Merino MD on 05/26/2009   Method used:   Print then Give to Patient   RxID:   [id number]

## 2010-03-03 NOTE — Assessment & Plan Note (Signed)
Summary: FU WRIST/MJD   Vital Signs:  Patient profile:   40 year old female Height:      69 inches BP sitting:   108 / 68  (left arm) Cuff size:   regular  Vitals Entered By: Tessie Fass CMA (August 27, 2009 9:20 AM) CC: F/U left wrist pain   CC:  F/U left wrist pain.  History of Present Illness: 40 yo female here with worsening left wrist pain.  Pt has had pain in her left wrist for about 6-8 weeks.  Tried a wrist splint before with good relief.  Has not been using the brace for the last 2 weeks due to recent hospitalization. pain is tingling and starts in her wrist and goes to all of her fingers.  Occasional sharp pains with certain movement.  Good stregnth  was recently in [facility] inpatient [facility]--her psych meds were changed  Habits & Providers  Alcohol-Tobacco-Diet     Tobacco Status: never  Current Medications (verified): 1)  Trazodone Hcl 100 Mg Tabs (Trazodone Hcl) .... 2 Tabs Po Qhs 2)  Junel 1.5/30 1.5-30 Mg-Mcg Tabs (Norethindrone Acet-Ethinyl Est) 3)  Hydrochlorothiazide 25 Mg Tabs (Hydrochlorothiazide) .Marland Kitchen.. 1 Tab Po Daily 4)  Toprol Xl 100 Mg Xr24h-Tab (Metoprolol Succinate) .Marland Kitchen.. 1 Tab Po Daily 5)  Psych Meds .... Per Behavioral Health  Allergies: 1)  ! Pcn 2)  ! Morphine  Social History: Smoking Status:  never  Review of Systems  The patient denies anorexia and fever.    Physical Exam  General:  alert, well-developed, and well-nourished.   Msk:  Left wrist: Pain with resisted extesnion--pain at radiocarpal joint line. Full flexionand extension stregnth 5/5 + Tinels, + finklestein's pain with resisted flexion no swelling, no redness strength hand and fingers normal. Additional Exam:  Patient given informed consent for injection. Discussed possible complications of infection, bleeding or skin atrophy at site of injection. Possible side effect of avascular necrosis (focal area of bone death) due to steroid use.Appropriate verbal time out taken  Are cleaned and prepped in usual sterile fashion. A ---1/2- cc kennalog plus -1/2---cc 1% lidocaine without epinephrine was injected into the-left wrist using dooral approach and Lister's tubercle as a landmark--. Patient tolerated procedure well with no complications.     Impression & Recommendations:  Problem # 1:  WRIST PAIN, LEFT (ICD-719.43) Assessment Deteriorated  Some pain in actual radiocarpal joint--not really a de quervains. -I recommended exercises and f/u in a month-but patient really wanted injection as she said it was too painful to do any exercises.-we went ahead and gave her  wrist injection, she will restart her brace and do HEP. Marland Kitchen wrist flexion and extension exercises demonstarted, pt given handout  Orders: Joint Aspirate / Injection, Intermediate (16109) Kenalog 10 mg inj (J3301)  Complete Medication List: 1)  Trazodone Hcl 100 Mg Tabs (Trazodone hcl) .... 2 tabs po qhs 2)  Junel 1.5/30 1.5-30 Mg-mcg Tabs (Norethindrone acet-ethinyl est) 3)  Hydrochlorothiazide 25 Mg Tabs (Hydrochlorothiazide) .Marland Kitchen.. 1 tab po daily 4)  Toprol Xl 100 Mg Xr24h-tab (Metoprolol succinate) .Marland Kitchen.. 1 tab po daily 5)  Psych Meds  .... Per behavioral health  Patient Instructions: 1)  complete exercises as I showed you.  Do they 15 times at least twice a day 2)  continue to wear your wrist brace 3)  ice your wrist 5-10 up to 5 times daily 4)  f/u in 3 weeks

## 2010-03-03 NOTE — Assessment & Plan Note (Signed)
Summary: achilles/kh   Vital Signs:  Patient profile:   40 year old female BP sitting:   122 / 88  Vitals Entered By: Lillia Pauls CMA (October 27, 2008 2:16 PM)  CC:  L achilles pain.  History of Present Illness: achilles: was doing better until about 1 week ago when started new exercise class at [facility].  doing circuit training with some step work and noticed significant pain in L achilles returning.  wearing her sneakers most of the time with the orthotics that were made for her.  denies swelling in the area but is having pain even with normal walking.  has tried heat and elevation without relief.  in past has done voltaren gel but caused her to break out in rash.  admits she hasn't been great about keeping up with heel exercises.   Physical Exam  General:  Obese ,in no acute distress; alert,appropriate and cooperative throughout examination Msk:  mild to moderate tenderness over L achilles all the way to insertion.  normal squeeze test. diameter again increased with ultrasound today to moderate degree with noted bursal swelling behind achilles.  no abnormal doppler activity.  thickness up to between 0.5-0.6 no tears visualized   Impression & Recommendations:  Problem # 1:  ACHILLES TENDINITIS (ICD-726.71) Assessment Deteriorated recurrent.  see pt instructions for plan of action.   this is recurrent needs to be careful about things that will flare  30% increase in swelling again  Complete Medication List: 1)  Voltaren 1 % Gel (Diclofenac sodium) .... Apply 3-4 times a day as needed disp qs 1 month  Patient Instructions: 1)  For your heel pain and inflammation - take regular ibuprofen from over the counter.  take 3 of them every 6 hours for inflammation. 2)  Do the book exercises. (about 6 at a time a few times a day and graduallly build up) 3)  Wear the lifting in the heel of the shoe 4)  Ice regularly. 5)  Do this religiously for about 6 weeks then follow up with Dr  Darrick Penna.

## 2010-03-03 NOTE — Assessment & Plan Note (Signed)
Summary: NP W/PERSISTENT ACHILLES/JW   Vital Signs:  Patient profile:   40 year old female Height:      69 inches Weight:      295 pounds BMI:     43.72 BP sitting:   108 / 70  Vitals Entered By: Lillia Pauls CMA (Jun 03, 2008 2:39 PM)  History of Present Illness: S: 40 y/o female with persistant left achilles pain since January. Back in january she slipped on ice and began having pain in her posterior heel. She was treated in a cam boot and eventually weaned out and given a heel lift and started on eccentric exercise. She did well for a while.  Over the last month the pain has returned. She thinks she aggravated it walking across campus. She has gone back to wearing the cam boot if it hurts a lot.   Physical Exam  General:  alert, well-developed, well-nourished, well-hydrated, and overweight-appearing.   Msk:  left achilles with no significant palpable swelling or defect mild tender to palp mid-achillles negative Thompson test 5/5 strength with plantarflexion and dorsiflexion  Mortons foot  ultrasound:  left achilles measures 0.64 cm at max thickness .  area of partial tearing in mid achilles but overall not much edema and normal doppler activity.axial view without significant edema but small area of hypoechogenicity seen.  right achilles measures 0.51 cm thick.images saved and stored for documenation  Impression & Recommendations:  Problem # 1:  ACHILLES TENDINITIS (ICD-726.71) Assessment New  Temporary insoles with heel lifts. Restart eccentric exercises (will likely need to do these long term) and carpet toes walking. given script for voltaren gel for pain. f/u in 4 weeks. if not better consider nitroglycerin patches  Orders: US EXTREMITY NON-VASC REAL-TIME IMG (16109) Sports Insoles ([phone])  Complete Medication List: 1)  Voltaren 1 % Gel (Diclofenac sodium) .... Apply 3-4 times a day as needed disp qs 1 month  Patient Instructions: 1)  wear the green insoles with the  lifts in your shoes 2)  do the eccentric exercises and carpet walking 3)  apply voltaren gel to the area up to 4 times a day 4)  f/u at this clinic in 4 weeks Prescriptions: VOLTAREN 1 % GEL (DICLOFENAC SODIUM) apply 3-4 times a day as needed disp qs 1 month  #1 x 1   Entered and Authorized by:   Gabrielle Dare MD   Signed by:   Gabrielle Dare MD on 06/03/2008   Method used:   Print then Give to Patient   RxID:   [id number]

## 2010-03-03 NOTE — Assessment & Plan Note (Signed)
Summary: FU FOOT/JW   Vital Signs:  Patient profile:   40 year old female BP sitting:   124 / 80  Vitals Entered By: Lillia Pauls CMA (July 01, 2008 2:49 PM)  History of Present Illness: 40 y/o female here to f/u left achilles tendonitis. She is wearing supportive tennis shoe.  Hell lifts help a lot . She has been able to do a lot of walking with minimal soreness. denies swelling. She is doing the eccentric exercises on a book. takes occasional ibuprofen (voltaren gel irritated her skin)  since she has had 6 months of issues with foot pain and AT problems I think we need to keep her in good foot support.  she feels the temp insoles help but are breaking down pretty fast with her weight.  Physical Exam  General:  Well-developed,obese,in no acute distress; alert,appropriate and cooperative throughout examination Msk:  Foot/ankle: L achilles nontender to palpation  No significant swelling, warmth, or redness Neg Thompson Test  Strength normal,  minimal  pain with dorsiflexion, plantarflexion  bilateral broad feet with resting pronation  gait much more controlled with custom orthotics in place .   Impression & Recommendations:  Problem # 1:  ACHILLES TENDINITIS (ICD-726.71) Assessment Improved  improving. temporary orthotics very helpful for her. custom orthotics fabricated today. she was instructed to continue eccentric exercises. f/u in 6-8 weeks Patient was fitted for a : standard, cushioned, semi-rigid orthotic. The orthotic was heated and afterward the patient stood on the orthotic blank positioned on the orthotic stand. The patient was positioned in subtalar neutral position and 10 degrees of ankle dorsiflexion in a weight bearing stance. After completion of molding, a stable base was applied to the orthotic blank. The blank was ground to a stable position for weight bearing. Size:11 Base: black  Posting: blue EVA Additional orthotic padding:none prep time 30  minutes  Orders: Orthotic Materials, each unit (L3002)   will reck in 6 ot 8 weeks and rescan her AT to see if less thickened.  Complete Medication List: 1)  Voltaren 1 % Gel (Diclofenac sodium) .... Apply 3-4 times a day as needed disp qs 1 month

## 2010-03-03 NOTE — Assessment & Plan Note (Signed)
Summary: F/U RASH/ L WRIST   Vital Signs:  Patient profile:   40 year old female Height:      69.5 inches (176.53 cm) Weight:      241.8 pounds (109.91 kg) BMI:     35.32 BP sitting:   125 / 84  Vitals Entered By: Kathi Simpers [facility]) (October 15, 2009 8:38 AM)  History of Present Illness: f/u left wrist pain had her sugery--feels some better. Has questions about any exerises she should be doing  still some ankle pain--no new sz  rash ion hands is worse  Current Medications (verified): 1)  Trazodone Hcl 100 Mg Tabs (Trazodone Hcl) .... 2 Tabs Po At Bedtime Per Psych 2)  Junel 1.5/30 1.5-30 Mg-Mcg Tabs (Norethindrone Acet-Ethinyl Est) 3)  Hydrochlorothiazide 25 Mg Tabs (Hydrochlorothiazide) .Marland Kitchen.. 1 Tab Po Daily 4)  Toprol Xl 100 Mg Xr24h-Tab (Metoprolol Succinate) .Marland Kitchen.. 1 Tab Po Daily 5)  Psych Meds .... Per Behavioral Health 6)  Mobic 15 Mg Tabs (Meloxicam) .... Take One Tab Qd 7)  Triamcinolone in Absorbase 0.05 % Oint (Triamcinolone Acetonide) .... Apply Two Times A Day As Needed Hands For Rash 8)  Klonopin 0.5 Mg Tabs (Clonazepam) .... Two Times A Day Per Psych 9)  Adderall 20 Mg Tabs (Amphetamine-Dextroamphetamine) .... Two Times A Day Per Psych 10)  Ambien 10 Mg Tabs (Zolpidem Tartrate) .... At Bedtime Per Psych 11)  Fanapt 4 Mg .... Two Times A Day Per Psych  Allergies: 1)  ! Pcn 2)  ! Morphine 3)  ! Prednisone  Review of Systems  The patient denies fever.    Physical Exam  General:  alert, well-developed, well-nourished, and well-hydrated.   Msk:  left wrist has healing volar incision. distally neurovascularly intact Skin:  small amount of dushydrotic changes left > right hand. no warmth or erythenmma   Impression & Recommendations:  Problem # 1:  ECZEMA, ATOPIC (ICD-691.8)  Her updated medication list for this problem includes:    Triamcinolone in Absorbase 0.05 % Oint (Triamcinolone acetonide) .Marland Kitchen... Apply two times a day as needed hands for rash would  continue triamcinolone and add vaseline moisturizer  Problem # 2:  WRIST PAIN, LEFT (ICD-719.43) advised strict following of hand surgeons post op instructions  Complete Medication List: 1)  Trazodone Hcl 100 Mg Tabs (Trazodone hcl) .... 2 tabs po at bedtime per psych 2)  Junel 1.5/30 1.5-30 Mg-mcg Tabs (Norethindrone acet-ethinyl est) 3)  Hydrochlorothiazide 25 Mg Tabs (Hydrochlorothiazide) .Marland Kitchen.. 1 tab po daily 4)  Toprol Xl 100 Mg Xr24h-tab (Metoprolol succinate) .Marland Kitchen.. 1 tab po daily 5)  Psych Meds  .... Per behavioral health 6)  Mobic 15 Mg Tabs (Meloxicam) .... Take one tab qd 7)  Triamcinolone in Absorbase 0.05 % Oint (Triamcinolone acetonide) .... Apply two times a day as needed hands for rash 8)  Klonopin 0.5 Mg Tabs (Clonazepam) .... Two times a day per psych 9)  Adderall 20 Mg Tabs (Amphetamine-dextroamphetamine) .... Two times a day per psych 10)  Ambien 10 Mg Tabs (Zolpidem tartrate) .... At bedtime per psych 11)  Fanapt 4 Mg  .... Two times a day per psych

## 2010-03-03 NOTE — Assessment & Plan Note (Signed)
Summary: U/S L ACHILLIES TENDON,MC   Vital Signs:  Patient profile:   40 year old female Height:      69 inches Weight:      274 pounds BMI:     40.61 BP sitting:   126 / 82  Vitals Entered By: Lillia Pauls CMA (April 06, 2009 9:24 AM)  History of Present Illness: Pt presents for follow-up of left achilles tendinitis. She has been doing a little bit of her exercises but not on a regular basis. The pain is located at the base of her left achilles tendon. She did not tolerate voltaren gel previously because of redness and skin irritation. She does have her custom orthotics and uses them about 95 % of the time and believes that they are helpful. She has also been working on her nutrition and has lost 30 pounds. She is not taking regular pain medications.   Allergies (verified): 1)  ! Pcn 2)  ! Prednisone 3)  ! Morphine  Physical Exam  General:  alert and well-developed.   Head:  normocephalic and atraumatic.   Neck:  supple.   Lungs:  normal respiratory effort.   Msk:  Left Foot and Ankle: Normal inspection. Pes planus with midfoot breakdown. Full ROM of ankle in all directions + TTP over base of the achilles tendon with slight palpable nodule Neg tap test No TTP along medial and lateral maleoli, base of the 5th MT, or navicular Able to bear weight well 5/5 strength with resisted ankle ROM Neurovascularly intact  Right foot and ankle: Normal inspection. Pes planus with midfoot breakdown. Normal ankle ROM No TTP throughout including achilles, navicular and medial and lateral maleoli Neg tap test 5/5 strength with resisted ankle ROM Neurovascularly intact Additional Exam:  U/S of the left achilles tendon shows a microtear at the base of the achilles tendon attachment. Thickness at this site is 0.87cm. No calcifications noted. U/S of the right achilles tendon shows no edema or microtears. Thickness is 0.52cm. Images saved for documenation.    Impression &  Recommendations:  Problem # 1:  ACHILLES TENDINITIS (ICD-726.71) Assessment Unchanged  1. Will start nitroglycerin patch, 0.2mg /hr, 1/4 to left achilles tendon Q24 hours. She has been warned about the possibility of headaches. She is on multiple medications so she has been informed to look for side effects and to call us with any concerns. She can take Tyelnol or ibuprofen for headaches. 2. Continue exercises and try to incorporate them into her daily activities 3. Ice for 20 minutes daily 4. 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   Complete Medication List: 1)  Voltaren 1 % Gel (Diclofenac sodium) .... Apply 3-4 times a day as needed disp qs 1 month 2)  Nitroglycerin 0.2 Mg/hr Pt24 (Nitroglycerin) .... 1/4 patch to left achilles tendon every 24 hours 3)  Risperdal M-tab 1 Mg Tbdp (Risperidone) .... 3mg  by mouth at bedtime and 1mg  bid 4)  Tegretol Xr 200 Mg Xr12h-tab (Carbamazepine) .... 2 tabs by mouth bid 5)  Trazodone Hcl 100 Mg Tabs (Trazodone hcl) .... 2 tabs po qhs 6)  Ambien 10 Mg Tabs (Zolpidem tartrate) .Marland Kitchen.. 1 po qhs 7)  Xanax 0.25 Mg Tabs (Alprazolam) .Marland Kitchen.. 1 tab by mouth bid 8)  Adderall 20 Mg Tabs (Amphetamine-dextroamphetamine) .Marland Kitchen.. 1 tab po bid 9)  Loestrin 1.5/30 (21) 1.5-30 Mg-mcg Tabs (Norethindrone acet-ethinyl est) .Marland KitchenMarland KitchenMarland Kitchen  1 tab po daily (not sure of dosage) 10)  Cabergoline 0.5 Mg Tabs (Cabergoline) .... 1/2 tab by mouth twice a week 11)  Hydrochlorothiazide 25 Mg Tabs (Hydrochlorothiazide) .Marland Kitchen.. 1 tab po daily 12)  Toprol Xl 100 Mg Xr24h-tab (Metoprolol succinate) .Marland Kitchen.. 1 tab po daily 13)  Depakote Er 500 Mg Xr24h-tab (Divalproex sodium) .... 4 tabs po qhs Prescriptions: NITROGLYCERIN 0.2 MG/HR PT24 (NITROGLYCERIN) 1/4 patch to left achilles tendon every 24 hours  #10 x 1   Entered and Authorized by:   Jannifer Rodney MD   Signed by:   Jannifer Rodney MD on 04/06/2009   Method used:   Electronically to        [facility]* (retail)       [address] [place], [place]  [address]       Ph: [phone]       Fax: [phone]   RxID:   [id number]

## 2010-03-03 NOTE — Assessment & Plan Note (Signed)
Summary: FU WRIST PAIN   Vital Signs:  Patient profile:   40 year old female BP sitting:   120 / 86  Vitals Entered By: Lillia Pauls CMA (September 07, 2009 2:48 PM)  History of Present Illness: a 40 year old female patient here with left-sided worsening wrist pain, history of trauma, fell on outstretched hand in June, 2011. At that point, initial x-rays were negative, including scaphoid view, complete view of the wrist. These were reviewed today in the office by me. No evidence of occult fracture.  That point, she has intermittently been in a cockup wrist splint, comment continues to have significant amounts of pain. She had an intra-articular wrist injection on last office visit. At this point, she actually fixed she is worse compared to prior examination.  Follow wrist x-rays, September 03, 2009: Three-view wrist series including scaphoid. No evidence of occult fracture is seen.  Allergies: 1)  ! Pcn 2)  ! Morphine  Past History:  Past medical, surgical, family and social histories (including risk factors) reviewed, and no changes noted (except as noted below).  Past Medical History: Reviewed history from 04/27/2009 and no changes required. Bipolar disorder, hosp, 03/2009 HTN  Family History: Reviewed history and no changes required.  Social History: Reviewed history and no changes required. [employer] student  Review of Systems       REVIEW OF SYSTEMS  GEN: No systemic complaints, no fevers, chills, sweats, or other acute illnesses MSK: Detailed in the HPI GI: tolerating PO intake without difficulty Neuro: No numbness, parasthesias, or tingling associated. Otherwise the pertinent positives of the ROS are noted above.    Physical Exam  General:  GEN: Well-developed,well-nourished,in no acute distress; alert,appropriate and cooperative throughout examination HEENT: Normocephalic and atraumatic without obvious abnormalities. No apparent alopecia or balding. Ears, externally no  deformities PULM: Breathing comfortably in no respiratory distress EXT: No clubbing, cyanosis, or edema PSYCH: Normally interactive. Cooperative during the interview. Pleasant. Friendly and conversant. Not anxious or depressed appearing. Normal, full affect.  Msk:  s: Full range of motion of the digits. Nontender throughout all digits.  Metacarpals are nontender throughout. There is swelling in the dorsum of the wrist and the true wrist, as well as in the 1st dorsal compartment.  patient is notably tender in the anatomical snuffbox and in the volar aspect of the scaphoid as well. Tender with manipulation of the scaphoid in its entirety.  Additionally, tender palpation at the hook of the hamate.  Axial loading is tender. No tenderness with ulnar deviation, and there is some marking tenderness with radial deviation. Nontender with compression and shock at the radial ulnar joint.  No triggering of any digits.  Finkelstein's is positive   Impression & Recommendations:  Problem # 1:  WRIST PAIN, LEFT (ICD-719.43) Clinical concern for worsening wrist pain ever 2 months time. Despite immobilization. tenderness to palpation at the scaphoid with normal plain x-rays, tenderness to palpation primarily in the carpal region, including of the hamate. Concern for potential occult injury not picked up on plain film, cannot rule out avascular necrosis. We'll obtain MRI to further delineate.  Change to thumb spica splint for greater mobilization in the first digit.  Orders: Splint Wrist ([id number])  Complete Medication List: 1)  Trazodone Hcl 100 Mg Tabs (Trazodone hcl) .... 2 tabs po qhs 2)  Junel 1.5/30 1.5-30 Mg-mcg Tabs (Norethindrone acet-ethinyl est) 3)  Hydrochlorothiazide 25 Mg Tabs (Hydrochlorothiazide) .Marland Kitchen.. 1 tab po daily 4)  Toprol Xl 100 Mg Xr24h-tab (Metoprolol succinate) .Marland KitchenMarland KitchenMarland Kitchen  1 tab po daily 5)  Psych Meds  .... Per behavioral health  Appended Document: FU WRIST PAIN

## 2010-03-03 NOTE — Assessment & Plan Note (Signed)
Summary: F/U,MC   Vital Signs:  Patient profile:   40 year old female BP sitting:   123 / 89  Vitals Entered By: Lillia Pauls CMA (April 27, 2009 3:54 PM)  History of Present Illness: Pt presents for follow-up of left achilles tendinitis - actually feels a little worse.  She has been doing her eccentric program and stretching a great deal. The pain is located at the base of her left achilles tendon. She did not tolerate voltaren gel previously because of redness and skin irritation. She does have her custom orthotics and uses them about 95 % of the time and believes that they are helpful.  Currently using NTG patches without significant SE  Recent psych hospitalization "to fix her meds"  Allergies: 1)  ! Pcn 2)  ! Prednisone 3)  ! Morphine  Past History:  Past Medical History: Bipolar disorder, hosp, 03/2009 HTN  Review of Systems       REVIEW OF SYSTEMS  GEN: No systemic complaints, no fevers, chills, sweats, or other acute illnesses MSK: Detailed in the HPI GI: tolerating PO intake without difficulty Neuro: No numbness, parasthesias, or tingling associated. PSYCH as above. Otherwise the pertinent positives of the ROS are noted above.    Physical Exam  General:  GEN: Well-developed,well-nourished,in no acute distress; alert,appropriate and cooperative throughout examination HEENT: Normocephalic and atraumatic without obvious abnormalities. No apparent alopecia or balding. Ears, externally no deformities PULM: Breathing comfortably in no respiratory distress EXT: No clubbing, cyanosis, or edema PSYCH: Normally interactive. Cooperative during the interview. Pleasant. Friendly and conversant. Not anxious or depressed appearing. Normal, full affect.  Msk:  Left Foot and Ankle: Normal inspection. Pes planus with midfoot breakdown. Full ROM of ankle in all directions + TTP over base of the achilles tendon with slight palpable nodule Neg tap test No TTP along medial and  lateral maleoli, base of the 5th MT, or navicular Able to bear weight well 5/5 strength with resisted ankle ROM Neurovascularly intact  Right foot and ankle: Normal inspection. Pes planus with midfoot breakdown. Normal ankle ROM No TTP throughout including achilles, navicular and medial and lateral maleoli Neg tap test 5/5 strength with resisted ankle ROM Neurovascularly intact   Impression & Recommendations:  Problem # 1:  ACHILLES TENDINITIS (ICD-726.71) Assessment Deteriorated Known microtear seen on u/s recently. Has been stretching a lot.  Stop all stretching, OK to resume eccentric program from [place], but otherwise, no stretching.  she will f/u with Dr. Darrick Penna who has seen her for a long time.  Problem # 2:  BIPOLAR DISORDER UNSPECIFIED (ICD-296.80) Assessment: Deteriorated given Elavil by her neurologist - taken 1 day, felt "weird" today. For migraines.  I asked her to speak to her psychiatrist before starting TCA. Has f/u next week.  Complete Medication List: 1)  Voltaren 1 % Gel (Diclofenac sodium) .... Apply 3-4 times a day as needed disp qs 1 month 2)  Nitroglycerin 0.2 Mg/hr Pt24 (Nitroglycerin) .... 1/4 patch to left achilles tendon every 24 hours 3)  Risperdal M-tab 1 Mg Tbdp (Risperidone) .... 3mg  by mouth at bedtime and 1mg  bid 4)  Tegretol Xr 200 Mg Xr12h-tab (Carbamazepine) .... 2 tabs by mouth bid 5)  Trazodone Hcl 100 Mg Tabs (Trazodone hcl) .... 2 tabs po qhs 6)  Ambien 10 Mg Tabs (Zolpidem tartrate) .Marland Kitchen.. 1 po qhs 7)  Xanax 0.25 Mg Tabs (Alprazolam) .Marland Kitchen.. 1 tab by mouth bid 8)  Adderall 20 Mg Tabs (Amphetamine-dextroamphetamine) .Marland Kitchen.. 1 tab po bid 9)  Loestrin 1.5/30 (21) 1.5-30 Mg-mcg Tabs (Norethindrone acet-ethinyl est) .Marland Kitchen.. 1 tab po daily (not sure of dosage) 10)  Cabergoline 0.5 Mg Tabs (Cabergoline) .... 1/2 tab by mouth twice a week 11)  Hydrochlorothiazide 25 Mg Tabs (Hydrochlorothiazide) .Marland Kitchen.. 1 tab po daily 12)  Toprol Xl 100 Mg Xr24h-tab  (Metoprolol succinate) .Marland Kitchen.. 1 tab po daily 13)  Depakote Er 500 Mg Xr24h-tab (Divalproex sodium) .... 4 tabs po qhs

## 2010-03-03 NOTE — Assessment & Plan Note (Signed)
Summary: F/U,MC   Vital Signs:  Patient profile:   40 year old female BP sitting:   126 / 89  Vitals Entered By: Lillia Pauls CMA (July 30, 2009 9:44 AM)  History of Present Illness: 40 yo F here for f/u L AT  Patient doing well with minimal pain Walks a lot around campus at [employer]. Using orthotics and heel cups Doing standing balance, toe raises/lowering exercises Could not tolerate topical voltaren and nitroglycerin Also was at [employer] this week when inverted R ankle off curb causing lateral pain + swelling.  Has been able to bear weight.  Went to [facility] yesterday and had x-rays that were negative per the physician there.  No prior sprains.  Now with ASO on.  Allergies (verified): 1)  ! Pcn 2)  ! Prednisone 3)  ! Morphine  Physical Exam  General:  alert.  nad. Msk:  L ankle: No gross deformity, swelling, bruising. TTP 3 cm prox to achilles insertion on calcaneus No insertional TTP. Mild tenderness within plantar fascia. FROM ankle neg ant drawer and talar tilt. NVI distally.  R ankle: mod swelling distal to lateral malleolus. No fibular head TTP Neg syndesmotic compression. TTP over ATFL and peroneal tendons post to lateral malleolus No base 5th, navicular, posterior med/lat malleolar TTP. Mild limitation ROM all planes 1+ talar tilt - pain with this.  Neg ant drawer. Additional Exam:  MSK u/s: L AT 0.60 cm at greatest thickness.  No tears, calcifications, or increased neovascularity.   Impression & Recommendations:  Problem # 1:  ACHILLES TENDINITIS (ICD-726.71) Assessment Improved Continue with exercises, orthotics with heel cups.  Icing.  F/u in 1 month for reevaluation.  Doing much better and tendon much improved by ultrasound.  Problem # 2:  ANKLE SPRAIN, RIGHT (ICD-845.00) Assessment: New Continue with ASO - wear at least 6 weeks on uneven ground.  Simple ROM exercises out of brace.  Complete Medication List: 1)  Trazodone Hcl 100 Mg Tabs  (Trazodone hcl) .... 2 tabs po qhs 2)  Ambien 10 Mg Tabs (Zolpidem tartrate) .Marland Kitchen.. 1 po qhs 3)  Xanax 0.25 Mg Tabs (Alprazolam) .Marland Kitchen.. 1 tab by mouth bid 4)  Adderall 20 Mg Tabs (Amphetamine-dextroamphetamine) .Marland Kitchen.. 1 tab po bid 5)  Junel 1.5/30 1.5-30 Mg-mcg Tabs (Norethindrone acet-ethinyl est) 6)  Cabergoline 0.5 Mg Tabs (Cabergoline) .... On hold 7)  Hydrochlorothiazide 25 Mg Tabs (Hydrochlorothiazide) .Marland Kitchen.. 1 tab po daily 8)  Toprol Xl 100 Mg Xr24h-tab (Metoprolol succinate) .Marland Kitchen.. 1 tab po daily 9)  Depakote Er 500 Mg Xr24h-tab (Divalproex sodium) .... 4 tabs po qhs  Patient Instructions: 1)  You sprained your right ankle - continue using the ankle brace for support for the next 6 weeks on uneven surfaces. 2)  Call [employer] to confirm you do not have a fracture. 3)  When able, continue with the following exercises: 4)  1)  Toe raises--using both feet go up on your toes and then back down. Start with five at a time and gradually add until you can do 20 three times a day. 5)  2) Second exercise is to stand on one foot---working up to 45 seconds at a time. Try to do this at leadt 5 times a day. I would do both feet. 6)  Ice both feet/ankles for up to 15 minutes at a time 3-4 times a day. 7)  This looks better on ultrasound. 8)  Use your custom orthotics with heel cups. 9)  Follow up with  [place] in 6 weeks or sooner if needed.

## 2010-03-03 NOTE — Progress Notes (Signed)
Summary: still in pain  Phone Note Call from Patient Call back at [phone]   Caller: Patient Reason for Call: Talk to Nurse Summary of Call: Pt says she is still having pain and wants to know if she should increase med.  Requests return call. Initial call taken by: Levada Schilling,  Jun 05, 2008 10:03 AM  Follow-up for Phone Call        instructed pt to double dose of voltaren gel to 4 grm qid, ice 3 x day and see if any changes over the wknd. call us mon if no sxs have changed Follow-up by: Lillia Pauls CMA,  Jun 05, 2008 10:57 AM

## 2010-03-03 NOTE — Assessment & Plan Note (Signed)
Summary: F/U WRIST,MC   History of Present Illness: continued pain with wrist  2) Had some medicationinteractions and was taken off her tramadol. She was so sleepy ;ast Thursday she missed some classes. Needs a not--has a call in to her psychiatrist but has not heard back yet.  3) rash --itchy--hands. has it recurring every few months or so.  Current Medications (verified): 1)  Trazodone Hcl 100 Mg Tabs (Trazodone Hcl) .... 2 Tabs Po Qhs 2)  Junel 1.5/30 1.5-30 Mg-Mcg Tabs (Norethindrone Acet-Ethinyl Est) 3)  Hydrochlorothiazide 25 Mg Tabs (Hydrochlorothiazide) .Marland Kitchen.. 1 Tab Po Daily 4)  Toprol Xl 100 Mg Xr24h-Tab (Metoprolol Succinate) .Marland Kitchen.. 1 Tab Po Daily 5)  Psych Meds .... Per Behavioral Health 6)  Mobic 15 Mg Tabs (Meloxicam) .... Take One Tab Qd 7)  Triamcinolone in Absorbase 0.05 % Oint (Triamcinolone Acetonide) .... Apply Two Times A Day As Needed Hands For Rash  Allergies: 1)  ! Pcn 2)  ! Morphine  Physical Exam  General:  alert, well-developed, well-nourished, and well-hydrated.   Msk:  Left wrist pain with extension. FROM. Allfingers with intact strength. Neurovascualrly intact Additional Exam:  MRI QWRIST:  Bilobed ganglion arising from the radial and volar aspect of the wrist.  Part of the ganglion is insinuated in the extrinsic ligaments.  No mass effect in the carpal tunnel.   Impression & Recommendations:  Problem # 1:  WRIST PAIN, LEFT (JXB-147.82)  Orders: Orthopedic Surgeon Referral (Ortho Surgeon) hand surgeon referrral  Problem # 2:  ECZEMA, ATOPIC (ICD-691.8)  Her updated medication list for this problem includes:    Triamcinolone in Absorbase 0.05 % Oint (Triamcinolone acetonide) .Marland Kitchen... Apply two times a day as needed hands for rash  Complete Medication List: 1)  Trazodone Hcl 100 Mg Tabs (Trazodone hcl) .... 2 tabs po qhs 2)  Junel 1.5/30 1.5-30 Mg-mcg Tabs (Norethindrone acet-ethinyl est) 3)  Hydrochlorothiazide 25 Mg Tabs (Hydrochlorothiazide) .Marland Kitchen..  1 tab po daily 4)  Toprol Xl 100 Mg Xr24h-tab (Metoprolol succinate) .Marland Kitchen.. 1 tab po daily 5)  Psych Meds  .... Per behavioral health 6)  Mobic 15 Mg Tabs (Meloxicam) .... Take one tab qd 7)  Triamcinolone in Absorbase 0.05 % Oint (Triamcinolone acetonide) .... Apply two times a day as needed hands for rash  Patient Instructions: 1)  DR Mina Marble 9.1.11 AT 2:30. [address] [phone] Prescriptions: TRIAMCINOLONE IN ABSORBASE 0.05 % OINT (TRIAMCINOLONE ACETONIDE) apply two times a day as needed hands for rash  #601 x 0   Entered and Authorized by:   Denny Levy MD   Signed by:   Denny Levy MD on 09/27/2009   Method used:   Handwritten   RxID:   [id number]

## 2010-03-03 NOTE — Progress Notes (Signed)
Summary: [facility]  [facility]   Imported ByLevada Schilling 06/03/2008 14:42:13  _____________________________________________________________________  External Attachment:    Type:   Image     Comment:   External Document

## 2010-03-03 NOTE — Letter (Signed)
Summary: [facility]  [facility]   Imported By: Marily Memos 10/13/2009 14:04:45  _____________________________________________________________________  External Attachment:    Type:   Image     Comment:   External Document

## 2010-03-03 NOTE — Assessment & Plan Note (Signed)
Summary: recheck tendonitis/check knee pain   Vital Signs:  Patient profile:   40 year old female BP sitting:   107 / 77  Vitals Entered By: [name] RN (February 04, 2010 10:04 AM)  History of Present Illness: 39 yo F here for f/u on Lt Achille's, wrist, and new problem with Rt knee  1. Lt Achille's - still gets some random pain.  Also with some ankle instability issues.  Pain more proximal toward calf now.  2.  Wrist pain - has MR arthrogram set up by [facility] to eval for TFCC tear on Rt side.  Lt side still with some shooting pains following surgery to remove cyst.  3.  Rt knee - fell 2 weeks ago.  Now with some medial ttp whenever she walks.  wants to make sure ok to exercise on it.  No swelling or mech symptoms.  Allergies: 1)  ! Pcn 2)  ! Morphine 3)  ! Prednisone  Physical Exam  General:  overweight-appearing.   Msk:  Lt wrist: well healed scar  Rt knee: mild ttp over medial joint line and pes anserine region.  No effusion.  Lig intact, neg McMurray.  Lt Ankle: mild fat pad protrusion about lateral ankle, similar to right side.  neg ant drawer and talar tilt.  + mild ttp at proximal AT at myotend junction.  MSK [place] Lt AT: width of 0.59 cm.  No increased doppler flow.  No e/o tear.  Transv showed no gap.   Impression & Recommendations:  Problem # 1:  WRIST PAIN, LEFT (ICD-719.43) reassured likely from scar tissue s/p surgery - f/u with hand surgeon  Problem # 2:  ACHILLES TENDINITIS (ICD-726.71)  reassured that appears well healed on [place].  Activity as tolerated - f/u prn  Orders: [place] LIMITED ([id number])  Problem # 3:  KNEE PAIN, RIGHT, ACUTE (ICD-719.46) Likely from contusion during fall, no e/o meniscal tear or orther ligamentous injury - reassured, gradually increase exercise as tolerated. - f/u prn  Complete Medication List: 1)  Trazodone Hcl 100 Mg Tabs (Trazodone hcl) .... 2 tabs po at bedtime per psych 2)  Junel 1.5/30 1.5-30 Mg-mcg Tabs (Norethindrone  acet-ethinyl est) 3)  Hydrochlorothiazide 25 Mg Tabs (Hydrochlorothiazide) .Marland Kitchen.. 1 tab po daily 4)  Toprol Xl 100 Mg Xr24h-tab (Metoprolol succinate) .Marland Kitchen.. 1 tab po daily 5)  Psych Meds  .... Per behavioral health 6)  Mobic 15 Mg Tabs (Meloxicam) .... Take one tab qd 7)  Triamcinolone in Absorbase 0.05 % Oint (Triamcinolone acetonide) .... Apply two times a day as needed hands for rash 8)  Klonopin 0.5 Mg Tabs (Clonazepam) .... Two times a day per psych 9)  Adderall 20 Mg Tabs (Amphetamine-dextroamphetamine) .... Two times a day per psych 10)  Ambien 10 Mg Tabs (Zolpidem tartrate) .... At bedtime per psych 11)  Fanapt 4 Mg  .... Two times a day per psych   Orders Added: 1)  Est. Patient Level III [62130] 2)  [place] LIMITED [[id number]]

## 2010-03-03 NOTE — Letter (Signed)
Summary: Out of School  [facility]  [address]   [place], [place] [address]   Phone: [phone]  Fax: [phone]    September 27, 2009   Student:  [name]    To Whom It May Concern:   For Medical reasons, please excuse the above named student from school for the following dates:  Thursday  Augist 25 and Thursday September 1.     If you need additional information, please feel free to contact our office.   Sincerely,    Denny Levy MD    ****This is a legal document and cannot be tampered with.  Schools are authorized to verify all information and to do so accordingly.

## 2010-03-03 NOTE — Miscellaneous (Signed)
Summary: rash  Patient called because cream that was prescribed has caused a bad rash on her hand.   I scheduled her an appt in the first avail which is this Friday.  Her phone number is [phone]. Bradly Bienenstock  October 11, 2009 3:27 PM

## 2010-03-03 NOTE — Assessment & Plan Note (Signed)
Summary: OV FALL LEFT ACHILLES TENDON/MJD   Vital Signs:  Patient profile:   40 year old female BP sitting:   120 / 80  Vitals Entered By: Lillia Pauls CMA (Jun 09, 2008 11:32 AM)  History of Present Illness: 40yo female with hx achilles tendinitis to office s/p fall.  Fell 4 days ago at home while walking to phone.  Felt like ankle gave way & fell, did not trip.  Denies feeling pop or having tearing sensation.  Not sure if wearing shoes or barefoot at the time.  Pt was in ankle boot until last week & had progressed to shoes with orthotics.  After fall had some swelling & applied ice to area, then went to ER.  Pt was able to walk after fall with pain. ER instructed to wear boot & use crutches.  Still with some pain relieved with Motrin 600mg , tried Percocet, but had nausea/vomiting. Using Volatren gel, but stopped due to redness & skin irritation after increasing the dose.  Physical Exam  General:  Well-developed,obese,in no acute distress; alert,appropriate and cooperative throughout examination Msk:  Foot/ankle: L achilles tender to palpation at junction of gastroc to just above calcaneous. No significant swelling, warmth, or redness Neg Thompson Test Pt able to do toe raise - although does have pain on left Strength normal, but does have pain with dorsiflexion, plantarflexion  Ankle: No visible erythema or swelling. Range of motion is full in all directions. Strength is 5/5 in all directions with some pain as stated above Stable lateral and medial ligaments; squeeze test and kleiger test unremarkable; Talar dome nontender; No pain at base of 5th MT; No tenderness over cuboid; No tenderness over N spot or navicular prominence No tenderness on posterior aspects of lateral and medial malleolus No sign of peroneal tendon subluxations; Able to walk 4 steps. Additional Exam:  ultrasound:  left achilles measures 0.66 cm at max thickness.  Do not visualize any new tearing or achilles  disruption on current scan.  Mild amount of edema around the tendon.  Doppler did not show significant perforating vessels.  Images were saved & stored.   Impression & Recommendations:  Problem # 1:  ACHILLES TENDINITIS (ICD-726.71) No new tears noted on u/s today, suspect aggrevated previous injury.  Pt should cont to use crutches for next 1-2 days and slowly d/c them.  Should cont to wear boot for 3-4 days.  If pain improving should slowly wean boot by alternating 1hr in boot & 1hr in shoe.  Should cont. temporary insoles in shoes as directed last visit.  Cont. current medications as prescribed.  May consider NTG patches if pain not improving after several months. f/u in 3-4 weeks as previously recommended.  Problem # 2:  BIPOLAR DISORDER UNSPECIFIED (ICD-296.80) I suspect this hx of bipolar sxs contributes to her anxiety and perhaps some overrxn with fear about injury at times we discussed this and I reassured her that her AT is thick and really not prone to rupture without significant trauma  Complete Medication List: 1)  Voltaren 1 % Gel (Diclofenac sodium) .... Apply 3-4 times a day as needed disp qs 1 month  Patient Instructions: 1)  Wean crutches over next 1-2 days 2)  Wean boot over next 3-4 days as tolerated 3)  Wear temporary insoles once in shoes 4)  Cont medications as prescribed 5)  Cont exercises once pain improved 6)  f/u 3-4 weeks, may return sooner if needed.

## 2010-03-03 NOTE — Assessment & Plan Note (Signed)
Summary: 9:00-F/U,MC   Vital Signs:  Patient profile:   40 year old female BP sitting:   120 / 84  Vitals Entered By: Lillia Pauls CMA (July 09, 2009 9:03 AM)  History of Present Illness: some better only intermittent use of her exercises left achilles stillaches 4/10 with a lot of standing / walking.  Using her orthotics without problem  Current Medications (verified): 1)  Trazodone Hcl 100 Mg Tabs (Trazodone Hcl) .... 2 Tabs Po Qhs 2)  Ambien 10 Mg Tabs (Zolpidem Tartrate) .Marland Kitchen.. 1 Po Qhs 3)  Xanax 0.25 Mg Tabs (Alprazolam) .Marland Kitchen.. 1 Tab By Mouth Bid 4)  Adderall 20 Mg Tabs (Amphetamine-Dextroamphetamine) .Marland Kitchen.. 1 Tab Po Bid 5)  Junel 1.5/30 1.5-30 Mg-Mcg Tabs (Norethindrone Acet-Ethinyl Est) 6)  Cabergoline 0.5 Mg Tabs (Cabergoline) .... On Hold 7)  Hydrochlorothiazide 25 Mg Tabs (Hydrochlorothiazide) .Marland Kitchen.. 1 Tab Po Daily 8)  Toprol Xl 100 Mg Xr24h-Tab (Metoprolol Succinate) .Marland Kitchen.. 1 Tab Po Daily 9)  Depakote Er 500 Mg Xr24h-Tab (Divalproex Sodium) .... 4 Tabs Po Qhs  Allergies: 1)  ! Pcn 2)  ! Prednisone 3)  ! Morphine  Physical Exam  General:  alert.   Msk:  Left achilles mildy TTP at base and proximally about 4 cm. No defect. Plantar and dorsiflexion normal strength.   Poor blanace on single leg stance (cannot make 5 seconds) and can only do 3-4 heel raises on B stance at a time Additional Exam:  [place] limited L achilles--thickest portion measures at 0.88 L achilles I see no tear at the base. No fluid or defect, normal insertion on calcaneus. Compared with previous US   Impression & Recommendations:  Problem # 1:  ACHILLES TENDINITIS (ICD-726.71)  improved continue orthotics and heel lift general ankle strengthening exercises given inaddition to eccentric exercises which I do not think she is doing very regularly--the new ones she can do in any setting, no need for  a step rtc 4 w  Orders: [place] LIMITED ([id number])  Complete Medication List: 1)  Trazodone Hcl 100 Mg Tabs  (Trazodone hcl) .... 2 tabs po qhs 2)  Ambien 10 Mg Tabs (Zolpidem tartrate) .Marland Kitchen.. 1 po qhs 3)  Xanax 0.25 Mg Tabs (Alprazolam) .Marland Kitchen.. 1 tab by mouth bid 4)  Adderall 20 Mg Tabs (Amphetamine-dextroamphetamine) .Marland Kitchen.. 1 tab po bid 5)  Junel 1.5/30 1.5-30 Mg-mcg Tabs (Norethindrone acet-ethinyl est) 6)  Cabergoline 0.5 Mg Tabs (Cabergoline) .... On hold 7)  Hydrochlorothiazide 25 Mg Tabs (Hydrochlorothiazide) .Marland Kitchen.. 1 tab po daily 8)  Toprol Xl 100 Mg Xr24h-tab (Metoprolol succinate) .Marland Kitchen.. 1 tab po daily 9)  Depakote Er 500 Mg Xr24h-tab (Divalproex sodium) .... 4 tabs po qhs  Patient Instructions: 1)  Add the new exercises. Do them EACH day. 2)  1)  Toe raises--using both feet go up on your toes and then back down. Start with five at a time and gradually add until you can do 20 three times a day. 3)  2) Second exercise is to stand on one foot---working up to 45 seconds at a time. Try to do this at leadt 5 times a day. I would do both feet. 4)   See me back in about a month.

## 2010-03-03 NOTE — Miscellaneous (Signed)
  Clinical Lists Changes [name] dropped off her ypdated med list Medications: Changed medication from TRAZODONE HCL 100 MG TABS (TRAZODONE HCL) 2 tabs PO QHS to TRAZODONE HCL 100 MG TABS (TRAZODONE HCL) 2 tabs PO at bedtime per psych Added new medication of KLONOPIN 0.5 MG TABS (CLONAZEPAM) two times a day per psych Added new medication of ADDERALL 20 MG TABS (AMPHETAMINE-DEXTROAMPHETAMINE) two times a day per psych Added new medication of AMBIEN 10 MG TABS (ZOLPIDEM TARTRATE) at bedtime per psych Added new medication of * FANAPT 4 MG two times a day per psych
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
## 2010-03-03 NOTE — Assessment & Plan Note (Signed)
Summary: F/U ACHILLIES,MC   Vital Signs:  Patient profile:   40 year old female Pulse rate:   75 / minute BP sitting:   108 / 76  (right arm)  Vitals Entered By: [name] RN (November 29, 2009 2:32 PM) CC: f/u L achilles- 80% improved, right wrist pain lateral side   CC:  f/u L achilles- 80% improved and right wrist pain lateral side.  History of Present Illness: f/u  achilles pain--much better. Wants to start riding stationary ike and has questions  right wrist pain--unlike the left wrist pain she had. Wonders if using right wrist more since left wrist surgery.   Questions about pain meds.  Habits & Providers  Alcohol-Tobacco-Diet     Tobacco Status: never  Current Medications (verified): 1)  Trazodone Hcl 100 Mg Tabs (Trazodone Hcl) .... 2 Tabs Po At Bedtime Per Psych 2)  Junel 1.5/30 1.5-30 Mg-Mcg Tabs (Norethindrone Acet-Ethinyl Est) 3)  Hydrochlorothiazide 25 Mg Tabs (Hydrochlorothiazide) .Marland Kitchen.. 1 Tab Po Daily 4)  Toprol Xl 100 Mg Xr24h-Tab (Metoprolol Succinate) .Marland Kitchen.. 1 Tab Po Daily 5)  Psych Meds .... Per Behavioral Health 6)  Mobic 15 Mg Tabs (Meloxicam) .... Take One Tab Qd 7)  Triamcinolone in Absorbase 0.05 % Oint (Triamcinolone Acetonide) .... Apply Two Times A Day As Needed Hands For Rash 8)  Klonopin 0.5 Mg Tabs (Clonazepam) .... Two Times A Day Per Psych 9)  Adderall 20 Mg Tabs (Amphetamine-Dextroamphetamine) .... Two Times A Day Per Psych 10)  Ambien 10 Mg Tabs (Zolpidem Tartrate) .... At Bedtime Per Psych 11)  Fanapt 4 Mg .... Two Times A Day Per Psych  Allergies: 1)  ! Pcn 2)  ! Morphine 3)  ! Prednisone  Review of Systems  The patient denies anorexia, fever, and weight loss.    Physical Exam  General:  alert, well-developed, well-nourished, and well-hydrated.   Msk:  Left wrist scar ventral wrist is healing well  Right wrist mildly TTP  ulnar volar portion. No deformity, no bruising or erythema or warmth . Grip strength intact.Mild pain with  resisted supination.   Impression & Recommendations:  Problem # 1:  WRIST PAIN, RIGHT (ICD-719.43) mild overuse sprain--rx for wrist splint as we did nott have one tio fit her. use during day for 7-10 days. Expect complete resolution in taht  time  Problem # 2:  ACHILLES TENDINITIS (ICD-726.71) much better. Agree to start stationary cycling, rtc 4-6 weeks--she wants to do f/u US at that time  Complete Medication List: 1)  Trazodone Hcl 100 Mg Tabs (Trazodone hcl) .... 2 tabs po at bedtime per psych 2)  Junel 1.5/30 1.5-30 Mg-mcg Tabs (Norethindrone acet-ethinyl est) 3)  Hydrochlorothiazide 25 Mg Tabs (Hydrochlorothiazide) .Marland Kitchen.. 1 tab po daily 4)  Toprol Xl 100 Mg Xr24h-tab (Metoprolol succinate) .Marland Kitchen.. 1 tab po daily 5)  Psych Meds  .... Per behavioral health 6)  Mobic 15 Mg Tabs (Meloxicam) .... Take one tab qd 7)  Triamcinolone in Absorbase 0.05 % Oint (Triamcinolone acetonide) .... Apply two times a day as needed hands for rash 8)  Klonopin 0.5 Mg Tabs (Clonazepam) .... Two times a day per psych 9)  Adderall 20 Mg Tabs (Amphetamine-dextroamphetamine) .... Two times a day per psych 10)  Ambien 10 Mg Tabs (Zolpidem tartrate) .... At bedtime per psych 11)  Fanapt 4 Mg  .... Two times a day per psych   Orders Added: 1)  Est. Patient Level IV [78295]

## 2010-03-04 ENCOUNTER — Other Ambulatory Visit: Payer: Self-pay | Admitting: Family Medicine

## 2010-03-04 ENCOUNTER — Ambulatory Visit (INDEPENDENT_AMBULATORY_CARE_PROVIDER_SITE_OTHER): Payer: PRIVATE HEALTH INSURANCE | Admitting: Family Medicine

## 2010-03-04 ENCOUNTER — Other Ambulatory Visit (INDEPENDENT_AMBULATORY_CARE_PROVIDER_SITE_OTHER): Payer: Self-pay

## 2010-03-04 ENCOUNTER — Encounter: Payer: Self-pay | Admitting: Family Medicine

## 2010-03-04 DIAGNOSIS — M79652 Pain in left thigh: Secondary | ICD-10-CM

## 2010-03-04 DIAGNOSIS — M79609 Pain in unspecified limb: Secondary | ICD-10-CM | POA: Insufficient documentation

## 2010-03-04 DIAGNOSIS — D481 Neoplasm of uncertain behavior of connective and other soft tissue: Secondary | ICD-10-CM

## 2010-03-04 LAB — CONVERTED CEMR LAB
BUN: 9 mg/dL (ref 6–23)
CO2: 25 meq/L (ref 19–32)
Calcium: 9.4 mg/dL (ref 8.4–10.5)
Chloride: 105 meq/L (ref 96–112)
Creatinine, Ser: 0.92 mg/dL (ref 0.40–1.20)
Glucose, Bld: 83 mg/dL (ref 70–99)
Potassium: 3.8 meq/L (ref 3.5–5.3)
Sodium: 143 meq/L (ref 135–145)

## 2010-03-09 ENCOUNTER — Telehealth (INDEPENDENT_AMBULATORY_CARE_PROVIDER_SITE_OTHER): Payer: Self-pay | Admitting: *Deleted

## 2010-03-09 ENCOUNTER — Ambulatory Visit
Admission: RE | Admit: 2010-03-09 | Discharge: 2010-03-09 | Disposition: A | Discharge status: Home / Self Care | Payer: PRIVATE HEALTH INSURANCE | Source: Ambulatory Visit | Attending: Family Medicine | Admitting: Family Medicine

## 2010-03-09 DIAGNOSIS — M79652 Pain in left thigh: Secondary | ICD-10-CM

## 2010-03-09 MED ORDER — GADOBENATE DIMEGLUMINE 529 MG/ML IV SOLN: 20.0000 mL | Freq: Once | INTRAVENOUS | Status: AC | PRN

## 2010-03-17 NOTE — Progress Notes (Signed)
  Phone Note Outgoing Call   Summary of Call: [name] or [name] plz call her and tell her MRI wwas totally normal--NO return of her tumor. Pain must be from a strain and should resolve in time Thanks!  Denny Levy MD  March 09, 2010 4:29 PM   Follow-up for Phone Call        left message informing pt of normal MRI on voice mail Follow-up by: Lillia Pauls CMA,  March 10, 2010 2:48 PM

## 2010-03-17 NOTE — Assessment & Plan Note (Signed)
Summary: L HAMSTRING PAIN X 3 DAYS   Vital Signs:  Patient profile:   40 year old female BP sitting:   108 / 72  Vitals Entered By: Judge Stall (March 04, 2010 8:45 AM)  History of Present Illness: 3-5 days worsening thigh pain--hx of tumor removal there several years ago at [facility], (Dr Larey Brick). This pain feels exactly like that kind of tightness. She is very worried as she fears the tumor has recurred. no numbness or tingling  Allergies: 1)  ! Pcn 2)  ! Morphine 3)  ! Prednisone  Physical Exam  General:  alert, well-developed, well-nourished, and well-hydrated.   Msk:  posterior thigh reveals well healed scar. <Mildly TTP. Normal hamstring bulk and strength. No defect. Additional Exam:  [place] thigh--question of small arrea inhomogeneous muscle tissue atthe periphery of the body of biceps femoris.    Impression & Recommendations:  Problem # 1:  THIGH PAIN (ICD-729.5) hx of plexifor fibrocystic hystiocytoma. will get mri  Problem # 2:  NEOPLASM UNCERTAIN BHV CNCTV&OTH SOFT TISSUE (ICD-238.1)  Orders: Miscellaneous Lab Charge-[facility] (16109) MRI with Contrast (MRI w/Contrast)  Complete Medication List: 1)  Trazodone Hcl 100 Mg Tabs (Trazodone hcl) .... 2 tabs po at bedtime per psych 2)  Junel 1.5/30 1.5-30 Mg-mcg Tabs (Norethindrone acet-ethinyl est) 3)  Hydrochlorothiazide 25 Mg Tabs (Hydrochlorothiazide) .Marland Kitchen.. 1 tab po daily 4)  Toprol Xl 100 Mg Xr24h-tab (Metoprolol succinate) .Marland Kitchen.. 1 tab po daily 5)  Psych Meds  .... Per behavioral health 6)  Mobic 15 Mg Tabs (Meloxicam) .... Take one tab qd 7)  Triamcinolone in Absorbase 0.05 % Oint (Triamcinolone acetonide) .... Apply two times a day as needed hands for rash 8)  Klonopin 0.5 Mg Tabs (Clonazepam) .... Two times a day per psych 9)  Adderall 20 Mg Tabs (Amphetamine-dextroamphetamine) .... Two times a day per psych 10)  Ambien 10 Mg Tabs (Zolpidem tartrate) .... At bedtime per psych 11)  Fanapt 4 Mg  .... Two times a day  per psych   Patient Instructions: 1)  [address]; [facility]; [phone]. WED FEB 8TH AT 1:15PM; ARRIVE AT 12:45 FOR REGISTRATION   Orders Added: 1)  Miscellaneous Lab Charge-[facility] [99999] 2)  MRI with Contrast [MRI w/Contrast] 3)  Est. Patient Level IV [60454]

## 2010-03-22 ENCOUNTER — Emergency Department (HOSPITAL_COMMUNITY): Payer: PRIVATE HEALTH INSURANCE

## 2010-03-22 ENCOUNTER — Emergency Department (HOSPITAL_COMMUNITY)
Admission: EM | Admit: 2010-03-22 | Discharge: 2010-03-22 | Disposition: A | Discharge status: Home / Self Care | Payer: PRIVATE HEALTH INSURANCE | Attending: Emergency Medicine | Admitting: Emergency Medicine

## 2010-03-22 DIAGNOSIS — G43109 Migraine with aura, not intractable, without status migrainosus: Secondary | ICD-10-CM | POA: Insufficient documentation

## 2010-03-22 DIAGNOSIS — Z79899 Other long term (current) drug therapy: Secondary | ICD-10-CM | POA: Insufficient documentation

## 2010-03-22 DIAGNOSIS — R209 Unspecified disturbances of skin sensation: Secondary | ICD-10-CM | POA: Insufficient documentation

## 2010-03-22 DIAGNOSIS — J329 Chronic sinusitis, unspecified: Secondary | ICD-10-CM | POA: Insufficient documentation

## 2010-03-22 DIAGNOSIS — I10 Essential (primary) hypertension: Secondary | ICD-10-CM | POA: Insufficient documentation

## 2010-03-22 DIAGNOSIS — Z9889 Other specified postprocedural states: Secondary | ICD-10-CM | POA: Insufficient documentation

## 2010-03-22 DIAGNOSIS — F988 Other specified behavioral and emotional disorders with onset usually occurring in childhood and adolescence: Secondary | ICD-10-CM | POA: Insufficient documentation

## 2010-03-22 DIAGNOSIS — R29898 Other symptoms and signs involving the musculoskeletal system: Secondary | ICD-10-CM | POA: Insufficient documentation

## 2010-03-22 DIAGNOSIS — R5381 Other malaise: Secondary | ICD-10-CM | POA: Insufficient documentation

## 2010-03-22 DIAGNOSIS — F319 Bipolar disorder, unspecified: Secondary | ICD-10-CM | POA: Insufficient documentation

## 2010-03-22 LAB — DIFFERENTIAL
Basophils Absolute: 0 10*3/uL (ref 0.0–0.1)
Basophils Relative: 0 % (ref 0–1)
Eosinophils Absolute: 0.1 10*3/uL (ref 0.0–0.7)
Eosinophils Relative: 2 % (ref 0–5)
Lymphocytes Relative: 40 % (ref 12–46)
Lymphs Abs: 3.1 10*3/uL (ref 0.7–4.0)
Monocytes Absolute: 0.8 10*3/uL (ref 0.1–1.0)
Monocytes Relative: 10 % (ref 3–12)
Neutro Abs: 3.8 10*3/uL (ref 1.7–7.7)
Neutrophils Relative %: 48 % (ref 43–77)

## 2010-03-22 LAB — COMPREHENSIVE METABOLIC PANEL
ALT: 25 U/L (ref 0–35)
AST: 27 U/L (ref 0–37)
Albumin: 3.3 g/dL — ABNORMAL LOW (ref 3.5–5.2)
Alkaline Phosphatase: 43 U/L (ref 39–117)
BUN: 12 mg/dL (ref 6–23)
CO2: 28 mEq/L (ref 19–32)
Calcium: 9.1 mg/dL (ref 8.4–10.5)
Chloride: 101 mEq/L (ref 96–112)
Creatinine, Ser: 0.98 mg/dL (ref 0.4–1.2)
GFR calc Af Amer: >60 mL/min (ref >60)
GFR calc non Af Amer: >60 mL/min (ref >60)
Glucose, Bld: 95 mg/dL (ref 70–99)
Potassium: 3.9 mEq/L (ref 3.5–5.1)
Sodium: 135 mEq/L (ref 135–145)
Total Bilirubin: 0.2 mg/dL — ABNORMAL LOW (ref 0.3–1.2)
Total Protein: 6.1 g/dL (ref 6.0–8.3)

## 2010-03-22 LAB — CBC
HCT: 39.5 % (ref 36.0–46.0)
Hemoglobin: 13.5 g/dL (ref 12.0–15.0)
MCH: 31.3 pg (ref 26.0–34.0)
MCHC: 34.2 g/dL (ref 30.0–36.0)
MCV: 91.6 fL (ref 78.0–100.0)
Platelets: 201 10*3/uL (ref 150–400)
RBC: 4.31 MIL/uL (ref 3.87–5.11)
RDW: 11.9 % (ref 11.5–15.5)
WBC: 7.8 10*3/uL (ref 4.0–10.5)

## 2010-03-22 LAB — CK TOTAL AND CKMB (NOT AT ARMC)
CK, MB: 0.4 ng/mL (ref 0.3–4.0)
Relative Index: INVALID (ref 0.0–2.5)
Total CK: 47 U/L (ref 7–177)

## 2010-03-22 LAB — TROPONIN I: Troponin I: <0.01 ng/mL (ref 0.00–0.06)

## 2010-03-22 LAB — PROTIME-INR
INR: 0.96 (ref 0.00–1.49)
Prothrombin Time: 13 seconds (ref 11.6–15.2)

## 2010-03-22 LAB — APTT: aPTT: 20 seconds — ABNORMAL LOW (ref 24–37)

## 2010-03-23 ENCOUNTER — Inpatient Hospital Stay (HOSPITAL_COMMUNITY)
Admission: RE | Admit: 2010-03-23 | Discharge: 2010-03-30 | DRG: 885 | Disposition: A | Discharge status: Home / Self Care | Payer: PRIVATE HEALTH INSURANCE | Attending: Psychiatry | Admitting: Psychiatry

## 2010-03-23 DIAGNOSIS — K5909 Other constipation: Secondary | ICD-10-CM

## 2010-03-23 DIAGNOSIS — R45851 Suicidal ideations: Secondary | ICD-10-CM

## 2010-03-23 DIAGNOSIS — G43909 Migraine, unspecified, not intractable, without status migrainosus: Secondary | ICD-10-CM

## 2010-03-23 DIAGNOSIS — F603 Borderline personality disorder: Secondary | ICD-10-CM

## 2010-03-23 DIAGNOSIS — F909 Attention-deficit hyperactivity disorder, unspecified type: Secondary | ICD-10-CM

## 2010-03-23 DIAGNOSIS — Z880 Allergy status to penicillin: Secondary | ICD-10-CM

## 2010-03-23 DIAGNOSIS — E669 Obesity, unspecified: Secondary | ICD-10-CM

## 2010-03-23 DIAGNOSIS — IMO0002 Reserved for concepts with insufficient information to code with codable children: Secondary | ICD-10-CM

## 2010-03-23 DIAGNOSIS — F319 Bipolar disorder, unspecified: Principal | ICD-10-CM

## 2010-03-23 DIAGNOSIS — Z818 Family history of other mental and behavioral disorders: Secondary | ICD-10-CM

## 2010-03-23 DIAGNOSIS — I10 Essential (primary) hypertension: Secondary | ICD-10-CM

## 2010-03-24 DIAGNOSIS — F319 Bipolar disorder, unspecified: Secondary | ICD-10-CM

## 2010-03-24 DIAGNOSIS — F909 Attention-deficit hyperactivity disorder, unspecified type: Secondary | ICD-10-CM

## 2010-03-24 DIAGNOSIS — F603 Borderline personality disorder: Secondary | ICD-10-CM

## 2010-03-24 LAB — COMPREHENSIVE METABOLIC PANEL
ALT: 20 U/L (ref 0–35)
AST: 24 U/L (ref 0–37)
Albumin: 3.1 g/dL — ABNORMAL LOW (ref 3.5–5.2)
Alkaline Phosphatase: 41 U/L (ref 39–117)
BUN: 9 mg/dL (ref 6–23)
CO2: 28 mEq/L (ref 19–32)
Calcium: 9 mg/dL (ref 8.4–10.5)
Chloride: 104 mEq/L (ref 96–112)
Creatinine, Ser: 1.15 mg/dL (ref 0.4–1.2)
GFR calc Af Amer: >60 mL/min (ref >60)
GFR calc non Af Amer: 52 mL/min — ABNORMAL LOW (ref >60)
Glucose, Bld: 92 mg/dL (ref 70–99)
Potassium: 3.7 mEq/L (ref 3.5–5.1)
Sodium: 138 mEq/L (ref 135–145)
Total Bilirubin: 0.7 mg/dL (ref 0.3–1.2)
Total Protein: 5.8 g/dL — ABNORMAL LOW (ref 6.0–8.3)

## 2010-03-24 LAB — CBC
HCT: 42.7 % (ref 36.0–46.0)
Hemoglobin: 13.9 g/dL (ref 12.0–15.0)
MCH: 29.8 pg (ref 26.0–34.0)
MCHC: 32.6 g/dL (ref 30.0–36.0)
MCV: 91.4 fL (ref 78.0–100.0)
Platelets: 221 10*3/uL (ref 150–400)
RBC: 4.67 MIL/uL (ref 3.87–5.11)
RDW: 12 % (ref 11.5–15.5)
WBC: 6.9 10*3/uL (ref 4.0–10.5)

## 2010-03-24 LAB — VALPROIC ACID LEVEL: Valproic Acid Lvl: 107.7 ug/mL — ABNORMAL HIGH (ref 50.0–100.0)

## 2010-03-24 LAB — TSH: TSH: 2.811 u[IU]/mL (ref 0.350–4.500)
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
## 2010-03-27 LAB — VALPROIC ACID LEVEL: Valproic Acid Lvl: 82.3 ug/mL (ref 50.0–100.0)

## 2010-03-31 NOTE — Discharge Summary (Signed)
NAMEANJULI, [name]               ACCOUNT NO.:  [id number]  MEDICAL RECORD NO.:  [id number]           PATIENT TYPE:  I  LOCATION:  0500                          FACILITY:  [facility]  PHYSICIAN:  Marlis Edelson, DO        DATE OF BIRTH:  02/02/1970  DATE OF ADMISSION:  03/23/2010 DATE OF DISCHARGE:  03/30/2010                              DISCHARGE SUMMARY   REASON FOR ADMISSION:  This is a 40 year old female admitted with complaints of suicidal thoughts, attempted suicide approximately 3 weeks ago by overdosing, reporting persistent suicidal thoughts or chronic suicidal ideation for years which wax and wane.  FINAL DIAGNOSES:  Axis I:  Bipolar disorder type 1 by history, attention deficit hyperactivity disorder. Axis II:  Borderline personality disorder. Axis III:  History of hypertension, obesity, migraine, cephalgia. Axis IV:  Current psychosocial stressors relating to school, family, primary support group, and burden of illness. Axis V:  Current is 55 to 60.  PERTINENT LABORATORIES:  TSH of 2.811.  CMP was normal with a total protein of 5.8.  CBC was normal.  Valproic acid level was elevated at 107.7.  SIGNIFICANT FINDINGS:  This is a well-developed, well-nourished female in no distress, casually dressed, somewhat disheveled, pleasant, cooperative, engaging.  Eye contact was fair.  Her motor behavior was normal.  Her speech was clear and coherent with a regular rate, rhythm, volume, and tone.  Her level of consciousness was alert.  She describes her mood as scattered.  Her affect was appropriate, although somewhat constricted.  No significant anxiety was noted.  Thought processes were linear, logical, and goal directed.  No evidence of any hypomania or mania.  Thought content was unremarkable for paranoid thinking, delusions, or perceptual symptoms.  Her insight was present and she was fully alert and concentration was fair.  We admitted the patient to the adult unit, where she  will be observed and integrated into the adult milieu in the mood disorder groups.  We checked appropriate labs and adjusted her Depakote.  The patient was beginning to improve, able to laugh a little, having some waxing and waning of her suicidal thinking.  She was feeling more hopeful.  The patient had a migraine headache, from which she got relief from her Imitrex.  Endorsed one night where she slept only about 3 hours and had some suicidal thinking where she would to bang her head against the bathroom door, although she did make appropriate decisions and talk with the staff and was able to contract for safety.  The patient reported some agitation and anxiety and we initiated some Vistaril as needed and had Zofran and her Imitrex available for her migraine symptoms.  We discontinued her Fanapt, as she was getting no results from the medication and introduced Topamax, which we hope to target her migraines, her mood, and possible weight loss.  CONDITION ON DISCHARGE:  The patient was fully alert and cooperative. She was casually dressed.  Good eye contact.  Did feel somewhat frustrated that she needed to get on the computer and get some financial aid for school, but adamantly denied any suicidal or  homicidal thoughts, wanting to go home and get back to school.  She had an appointment with the nurse practitioner and was agreeable to continue with her Topamax with the possibility of having her dosage increased.  She denied any psychotic symptoms.  Appeared fully coherent and relevant her.  She showed no signs of hypomania or mania and denied any overt anxiety.  DISCHARGE MEDICATIONS: 1. Depakote 500 mg taking 3 at bedtime. 2. Topamax 25 mg one b.i.d. 3. Ambien 10 mg for sleep. 4. Amphetamine 10 mg one b.i.d. 5. Flonase 1 spray twice daily. 6. Hydrochlorothiazide 25 mg one daily. 7. Imitrex 6 mg daily as needed for migraines. 8. Toprol XL 100 mg one tablet daily. 9. The patient was  to stop her Fanapt, Klonopin 1 mg twice daily, and     trazodone. 10.Her Bactrim and her bacitracin ophthalmic ointment to her eyes.  FOLLOWUP APPOINTMENT:  With Lauris Poag at [phone] on Thursday, April 05, 2010, and to the [facility] on Monday, April 04, 2010, at 1 p.m. at phone number [phone].  The patient was to go to the emergency department if she had any problems with suicidal or homicidal thoughts or psychotic symptoms.     Landry Corporal, N.P.   ______________________________ Marlis Edelson, DO    JO/MEDQ  D:  03/30/2010  T:  03/30/2010  Job:  [id number]  Electronically Signed by Limmie PatriciaP. on 03/31/2010 10:22:08 AM Electronically Signed by Marlis Edelson MD on 03/31/2010 09:50:24 PM

## 2010-04-13 LAB — BASIC METABOLIC PANEL
BUN: 7 mg/dL (ref 6–23)
CO2: 26 mEq/L (ref 19–32)
Calcium: 8.8 mg/dL (ref 8.4–10.5)
Chloride: 107 mEq/L (ref 96–112)
Creatinine, Ser: 0.97 mg/dL (ref 0.4–1.2)
GFR calc Af Amer: >60 mL/min (ref >60)
GFR calc non Af Amer: >60 mL/min (ref >60)
Glucose, Bld: 102 mg/dL — ABNORMAL HIGH (ref 70–99)
Potassium: 4 mEq/L (ref 3.5–5.1)
Sodium: 140 mEq/L (ref 135–145)

## 2010-04-13 LAB — CBC
HCT: 36.7 % (ref 36.0–46.0)
Hemoglobin: 12.8 g/dL (ref 12.0–15.0)
MCH: 31.3 pg (ref 26.0–34.0)
MCHC: 34.8 g/dL (ref 30.0–36.0)
MCV: 89.7 fL (ref 78.0–100.0)
Platelets: 171 10*3/uL (ref 150–400)
RBC: 4.09 MIL/uL (ref 3.87–5.11)
RDW: 14.3 % (ref 11.5–15.5)
WBC: 5 10*3/uL (ref 4.0–10.5)

## 2010-04-13 LAB — DIFFERENTIAL
Basophils Absolute: 0 10*3/uL (ref 0.0–0.1)
Basophils Relative: 0 % (ref 0–1)
Eosinophils Absolute: 0.1 10*3/uL (ref 0.0–0.7)
Eosinophils Relative: 1 % (ref 0–5)
Lymphocytes Relative: 33 % (ref 12–46)
Lymphs Abs: 1.7 10*3/uL (ref 0.7–4.0)
Monocytes Absolute: 0.4 10*3/uL (ref 0.1–1.0)
Monocytes Relative: 8 % (ref 3–12)
Neutro Abs: 2.9 10*3/uL (ref 1.7–7.7)
Neutrophils Relative %: 57 % (ref 43–77)

## 2010-04-13 LAB — SURGICAL PCR SCREEN
MRSA, PCR: NEGATIVE
Staphylococcus aureus: NEGATIVE

## 2010-04-13 LAB — PREGNANCY, URINE: Preg Test, Ur: NEGATIVE

## 2010-04-16 LAB — PROLACTIN: Prolactin: 14.3 ng/mL

## 2010-04-17 LAB — COMPREHENSIVE METABOLIC PANEL
ALT: 26 U/L (ref 0–35)
AST: 33 U/L (ref 0–37)
Albumin: 4 g/dL (ref 3.5–5.2)
Alkaline Phosphatase: 55 U/L (ref 39–117)
BUN: 5 mg/dL — ABNORMAL LOW (ref 6–23)
CO2: 30 mEq/L (ref 19–32)
Calcium: 9.4 mg/dL (ref 8.4–10.5)
Chloride: 100 mEq/L (ref 96–112)
Creatinine, Ser: 0.81 mg/dL (ref 0.4–1.2)
GFR calc Af Amer: >60 mL/min (ref >60)
GFR calc non Af Amer: >60 mL/min (ref >60)
Glucose, Bld: 111 mg/dL — ABNORMAL HIGH (ref 70–99)
Potassium: 3.6 mEq/L (ref 3.5–5.1)
Sodium: 137 mEq/L (ref 135–145)
Total Bilirubin: 0.7 mg/dL (ref 0.3–1.2)
Total Protein: 6.5 g/dL (ref 6.0–8.3)

## 2010-04-17 LAB — RAPID URINE DRUG SCREEN, HOSP PERFORMED
Amphetamines: POSITIVE — AB
Barbiturates: NOT DETECTED
Benzodiazepines: POSITIVE — AB
Cocaine: NOT DETECTED
Opiates: NOT DETECTED
Tetrahydrocannabinol: NOT DETECTED

## 2010-04-17 LAB — CBC
HCT: 41.8 % (ref 36.0–46.0)
Hemoglobin: 14.4 g/dL (ref 12.0–15.0)
MCH: 31.9 pg (ref 26.0–34.0)
MCHC: 34.5 g/dL (ref 30.0–36.0)
MCV: 92.4 fL (ref 78.0–100.0)
Platelets: 192 10*3/uL (ref 150–400)
RBC: 4.52 MIL/uL (ref 3.87–5.11)
RDW: 12.2 % (ref 11.5–15.5)
WBC: 5.8 10*3/uL (ref 4.0–10.5)

## 2010-04-17 LAB — URINALYSIS, MICROSCOPIC ONLY
Bilirubin Urine: NEGATIVE
Glucose, UA: NEGATIVE mg/dL
Hgb urine dipstick: NEGATIVE
Ketones, ur: NEGATIVE mg/dL
Leukocytes, UA: NEGATIVE
Nitrite: NEGATIVE
Protein, ur: NEGATIVE mg/dL
Specific Gravity, Urine: 1.013 (ref 1.005–1.030)
Urobilinogen, UA: 0.2 mg/dL (ref 0.0–1.0)
pH: 7 (ref 5.0–8.0)

## 2010-04-17 LAB — ETHANOL: Alcohol, Ethyl (B): <5 mg/dL (ref 0–10)

## 2010-04-17 LAB — DIFFERENTIAL
Basophils Absolute: 0 10*3/uL (ref 0.0–0.1)
Basophils Relative: 0 % (ref 0–1)
Eosinophils Absolute: 0 10*3/uL (ref 0.0–0.7)
Eosinophils Relative: 0 % (ref 0–5)
Lymphocytes Relative: 34 % (ref 12–46)
Lymphs Abs: 2 10*3/uL (ref 0.7–4.0)
Monocytes Absolute: 0.5 10*3/uL (ref 0.1–1.0)
Monocytes Relative: 9 % (ref 3–12)
Neutro Abs: 3.3 10*3/uL (ref 1.7–7.7)
Neutrophils Relative %: 56 % (ref 43–77)

## 2010-04-17 LAB — POCT PREGNANCY, URINE: Preg Test, Ur: NEGATIVE

## 2010-04-17 LAB — VALPROIC ACID LEVEL
Valproic Acid Lvl: 79.7 ug/mL (ref 50.0–100.0)
Valproic Acid Lvl: 89.2 ug/mL (ref 50.0–100.0)

## 2010-04-18 LAB — ETHANOL: Alcohol, Ethyl (B): <5 mg/dL (ref 0–10)

## 2010-04-18 LAB — DIFFERENTIAL
Basophils Absolute: 0 10*3/uL (ref 0.0–0.1)
Basophils Relative: 0 % (ref 0–1)
Eosinophils Absolute: 0 10*3/uL (ref 0.0–0.7)
Eosinophils Relative: 1 % (ref 0–5)
Lymphocytes Relative: 34 % (ref 12–46)
Lymphs Abs: 1.7 10*3/uL (ref 0.7–4.0)
Monocytes Absolute: 0.6 10*3/uL (ref 0.1–1.0)
Monocytes Relative: 12 % (ref 3–12)
Neutro Abs: 2.6 10*3/uL (ref 1.7–7.7)
Neutrophils Relative %: 54 % (ref 43–77)

## 2010-04-18 LAB — RAPID URINE DRUG SCREEN, HOSP PERFORMED
Amphetamines: POSITIVE — AB
Barbiturates: NOT DETECTED
Benzodiazepines: POSITIVE — AB
Cocaine: NOT DETECTED
Opiates: NOT DETECTED
Tetrahydrocannabinol: NOT DETECTED

## 2010-04-18 LAB — CBC
HCT: 42.7 % (ref 36.0–46.0)
Hemoglobin: 14.4 g/dL (ref 12.0–15.0)
MCHC: 33.8 g/dL (ref 30.0–36.0)
MCV: 92.6 fL (ref 78.0–100.0)
Platelets: 208 10*3/uL (ref 150–400)
RBC: 4.61 MIL/uL (ref 3.87–5.11)
RDW: 12.8 % (ref 11.5–15.5)
WBC: 4.9 10*3/uL (ref 4.0–10.5)

## 2010-04-18 LAB — BASIC METABOLIC PANEL
BUN: 6 mg/dL (ref 6–23)
CO2: 27 mEq/L (ref 19–32)
Calcium: 9.5 mg/dL (ref 8.4–10.5)
Chloride: 104 mEq/L (ref 96–112)
Creatinine, Ser: 0.89 mg/dL (ref 0.4–1.2)
GFR calc Af Amer: >60 mL/min (ref >60)
GFR calc non Af Amer: >60 mL/min (ref >60)
Glucose, Bld: 123 mg/dL — ABNORMAL HIGH (ref 70–99)
Potassium: 3.7 mEq/L (ref 3.5–5.1)
Sodium: 139 mEq/L (ref 135–145)

## 2010-04-18 LAB — POCT PREGNANCY, URINE: Preg Test, Ur: NEGATIVE

## 2010-04-18 LAB — TRICYCLICS SCREEN, URINE: TCA Scrn: NOT DETECTED

## 2010-04-18 LAB — VALPROIC ACID LEVEL: Valproic Acid Lvl: 96.5 ug/mL (ref 50.0–100.0)

## 2010-04-19 LAB — DIFFERENTIAL
Basophils Absolute: 0 10*3/uL (ref 0.0–0.1)
Basophils Relative: 1 % (ref 0–1)
Eosinophils Absolute: 0.1 10*3/uL (ref 0.0–0.7)
Eosinophils Relative: 1 % (ref 0–5)
Lymphocytes Relative: 39 % (ref 12–46)
Lymphs Abs: 2.9 10*3/uL (ref 0.7–4.0)
Monocytes Absolute: 0.8 10*3/uL (ref 0.1–1.0)
Monocytes Relative: 11 % (ref 3–12)
Neutro Abs: 3.6 10*3/uL (ref 1.7–7.7)
Neutrophils Relative %: 48 % (ref 43–77)

## 2010-04-19 LAB — COMPREHENSIVE METABOLIC PANEL
ALT: 31 U/L (ref 0–35)
AST: 25 U/L (ref 0–37)
Albumin: 3.8 g/dL (ref 3.5–5.2)
Alkaline Phosphatase: 54 U/L (ref 39–117)
BUN: 8 mg/dL (ref 6–23)
CO2: 32 mEq/L (ref 19–32)
Calcium: 9.4 mg/dL (ref 8.4–10.5)
Chloride: 103 mEq/L (ref 96–112)
Creatinine, Ser: 0.81 mg/dL (ref 0.4–1.2)
GFR calc Af Amer: >60 mL/min (ref >60)
GFR calc non Af Amer: >60 mL/min (ref >60)
Glucose, Bld: 101 mg/dL — ABNORMAL HIGH (ref 70–99)
Potassium: 4.3 mEq/L (ref 3.5–5.1)
Sodium: 140 mEq/L (ref 135–145)
Total Bilirubin: 0.7 mg/dL (ref 0.3–1.2)
Total Protein: 6.1 g/dL (ref 6.0–8.3)

## 2010-04-19 LAB — CBC
HCT: 42.3 % (ref 36.0–46.0)
HCT: 38.8 % (ref 36.0–46.0)
Hemoglobin: 14.1 g/dL (ref 12.0–15.0)
Hemoglobin: 13.4 g/dL (ref 12.0–15.0)
MCHC: 33.3 g/dL (ref 30.0–36.0)
MCHC: 34.6 g/dL (ref 30.0–36.0)
MCV: 93.2 fL (ref 78.0–100.0)
MCV: 93.2 fL (ref 78.0–100.0)
Platelets: 211 10*3/uL (ref 150–400)
Platelets: 186 10*3/uL (ref 150–400)
RBC: 4.55 MIL/uL (ref 3.87–5.11)
RBC: 4.16 MIL/uL (ref 3.87–5.11)
RDW: 12.6 % (ref 11.5–15.5)
RDW: 12.3 % (ref 11.5–15.5)
WBC: 7.2 10*3/uL (ref 4.0–10.5)
WBC: 7.4 10*3/uL (ref 4.0–10.5)

## 2010-04-19 LAB — BASIC METABOLIC PANEL
BUN: 6 mg/dL (ref 6–23)
CO2: 29 mEq/L (ref 19–32)
Calcium: 9.3 mg/dL (ref 8.4–10.5)
Chloride: 106 mEq/L (ref 96–112)
Creatinine, Ser: 0.75 mg/dL (ref 0.4–1.2)
GFR calc Af Amer: >60 mL/min (ref >60)
GFR calc non Af Amer: >60 mL/min (ref >60)
Glucose, Bld: 114 mg/dL — ABNORMAL HIGH (ref 70–99)
Potassium: 3.7 mEq/L (ref 3.5–5.1)
Sodium: 139 mEq/L (ref 135–145)

## 2010-04-19 LAB — POCT CARDIAC MARKERS
CKMB, poc: <1 ng/mL — ABNORMAL LOW (ref 1.0–8.0)
CKMB, poc: <1 ng/mL — ABNORMAL LOW (ref 1.0–8.0)
Myoglobin, poc: 38.1 ng/mL (ref 12–200)
Myoglobin, poc: 45.7 ng/mL (ref 12–200)
Troponin i, poc: <0.05 ng/mL (ref 0.00–0.09)
Troponin i, poc: <0.05 ng/mL (ref 0.00–0.09)

## 2010-04-19 LAB — PROLACTIN: Prolactin: 19.1 ng/mL

## 2010-04-19 LAB — VALPROIC ACID LEVEL
Valproic Acid Lvl: 82.4 ug/mL (ref 50.0–100.0)
Valproic Acid Lvl: 86.4 ug/mL (ref 50.0–100.0)

## 2010-04-19 LAB — URINALYSIS, ROUTINE W REFLEX MICROSCOPIC
Bilirubin Urine: NEGATIVE
Glucose, UA: NEGATIVE mg/dL
Hgb urine dipstick: NEGATIVE
Ketones, ur: NEGATIVE mg/dL
Nitrite: NEGATIVE
Protein, ur: NEGATIVE mg/dL
Specific Gravity, Urine: 1.02 (ref 1.005–1.030)
Urobilinogen, UA: 0.2 mg/dL (ref 0.0–1.0)
pH: 7.5 (ref 5.0–8.0)

## 2010-04-19 LAB — RPR: RPR Ser Ql: NONREACTIVE

## 2010-04-19 LAB — CARBAMAZEPINE LEVEL, TOTAL: Carbamazepine Lvl: 5.7 ug/mL (ref 4.0–12.0)

## 2010-04-19 LAB — TSH: TSH: 1.205 u[IU]/mL (ref 0.350–4.500)

## 2010-04-24 LAB — PROLACTIN: Prolactin: 13.1 ng/mL

## 2010-04-24 LAB — CARBAMAZEPINE LEVEL, TOTAL: Carbamazepine Lvl: 5.9 ug/mL (ref 4.0–12.0)

## 2010-04-24 LAB — VALPROIC ACID LEVEL: Valproic Acid Lvl: 46.1 ug/mL — ABNORMAL LOW (ref 50.0–100.0)

## 2010-04-25 LAB — URINALYSIS, ROUTINE W REFLEX MICROSCOPIC
Bilirubin Urine: NEGATIVE
Glucose, UA: NEGATIVE mg/dL
Hgb urine dipstick: NEGATIVE
Ketones, ur: NEGATIVE mg/dL
Nitrite: NEGATIVE
Protein, ur: NEGATIVE mg/dL
Specific Gravity, Urine: 1.018 (ref 1.005–1.030)
Urobilinogen, UA: 0.2 mg/dL (ref 0.0–1.0)
pH: 7 (ref 5.0–8.0)

## 2010-04-25 LAB — POCT I-STAT, CHEM 8
BUN: 9 mg/dL (ref 6–23)
Calcium, Ion: 1.22 mmol/L (ref 1.12–1.32)
Chloride: 103 mEq/L (ref 96–112)
Creatinine, Ser: 0.6 mg/dL (ref 0.4–1.2)
Glucose, Bld: 101 mg/dL — ABNORMAL HIGH (ref 70–99)
HCT: 38 % (ref 36.0–46.0)
Hemoglobin: 12.9 g/dL (ref 12.0–15.0)
Potassium: 4 mEq/L (ref 3.5–5.1)
Sodium: 138 mEq/L (ref 135–145)
TCO2: 28 mmol/L (ref 0–100)

## 2010-04-25 LAB — DIFFERENTIAL
Basophils Absolute: 0 10*3/uL (ref 0.0–0.1)
Basophils Relative: 1 % (ref 0–1)
Eosinophils Absolute: 0.1 10*3/uL (ref 0.0–0.7)
Eosinophils Relative: 1 % (ref 0–5)
Lymphocytes Relative: 39 % (ref 12–46)
Lymphs Abs: 1.9 10*3/uL (ref 0.7–4.0)
Monocytes Absolute: 0.5 10*3/uL (ref 0.1–1.0)
Monocytes Relative: 9 % (ref 3–12)
Neutro Abs: 2.5 10*3/uL (ref 1.7–7.7)
Neutrophils Relative %: 50 % (ref 43–77)

## 2010-04-25 LAB — CBC
HCT: 39.4 % (ref 36.0–46.0)
Hemoglobin: 13.4 g/dL (ref 12.0–15.0)
MCHC: 34.1 g/dL (ref 30.0–36.0)
MCV: 91.4 fL (ref 78.0–100.0)
Platelets: 204 10*3/uL (ref 150–400)
RBC: 4.31 MIL/uL (ref 3.87–5.11)
RDW: 12.3 % (ref 11.5–15.5)
WBC: 5 10*3/uL (ref 4.0–10.5)

## 2010-04-25 LAB — POCT PREGNANCY, URINE: Preg Test, Ur: NEGATIVE

## 2010-04-25 LAB — POCT CARDIAC MARKERS
CKMB, poc: <1 ng/mL — ABNORMAL LOW (ref 1.0–8.0)
Myoglobin, poc: 30.6 ng/mL (ref 12–200)
Troponin i, poc: <0.05 ng/mL (ref 0.00–0.09)

## 2010-04-25 LAB — D-DIMER, QUANTITATIVE: D-Dimer, Quant: 0.23 ug/mL-FEU (ref 0.00–0.48)

## 2010-05-06 LAB — COMPREHENSIVE METABOLIC PANEL
ALT: 30 U/L (ref 0–35)
AST: 26 U/L (ref 0–37)
Albumin: 4.2 g/dL (ref 3.5–5.2)
Alkaline Phosphatase: 94 U/L (ref 39–117)
BUN: 9 mg/dL (ref 6–23)
CO2: 29 mEq/L (ref 19–32)
Calcium: 9.3 mg/dL (ref 8.4–10.5)
Chloride: 100 mEq/L (ref 96–112)
Creatinine, Ser: 0.84 mg/dL (ref 0.4–1.2)
GFR calc Af Amer: >60 mL/min (ref >60)
GFR calc non Af Amer: >60 mL/min (ref >60)
Glucose, Bld: 107 mg/dL — ABNORMAL HIGH (ref 70–99)
Potassium: 3.6 mEq/L (ref 3.5–5.1)
Sodium: 138 mEq/L (ref 135–145)
Total Bilirubin: 0.6 mg/dL (ref 0.3–1.2)
Total Protein: 6.7 g/dL (ref 6.0–8.3)

## 2010-05-06 LAB — T4, FREE: Free T4: 1.06 ng/dL (ref 0.80–1.80)

## 2010-05-06 LAB — CBC
HCT: 41.1 % (ref 36.0–46.0)
Hemoglobin: 14.2 g/dL (ref 12.0–15.0)
MCHC: 34.5 g/dL (ref 30.0–36.0)
MCV: 92.1 fL (ref 78.0–100.0)
Platelets: 229 10*3/uL (ref 150–400)
RBC: 4.46 MIL/uL (ref 3.87–5.11)
RDW: 12.3 % (ref 11.5–15.5)
WBC: 7.3 10*3/uL (ref 4.0–10.5)

## 2010-05-06 LAB — VALPROIC ACID LEVEL
Valproic Acid Lvl: 83.9 ug/mL (ref 50.0–100.0)
Valproic Acid Lvl: 33.6 ug/mL — ABNORMAL LOW (ref 50.0–100.0)

## 2010-05-06 LAB — T3, FREE: T3, Free: 3.9 pg/mL (ref 2.3–4.2)

## 2010-05-06 LAB — GLUCOSE, CAPILLARY: Glucose-Capillary: 133 mg/dL — ABNORMAL HIGH (ref 70–99)

## 2010-05-06 LAB — CARBAMAZEPINE LEVEL, TOTAL
Carbamazepine Lvl: 7.3 ug/mL (ref 4.0–12.0)
Carbamazepine Lvl: 2.4 ug/mL — ABNORMAL LOW (ref 4.0–12.0)

## 2010-05-06 LAB — TSH: TSH: 1.812 u[IU]/mL (ref 0.350–4.500)

## 2010-05-11 LAB — COMPREHENSIVE METABOLIC PANEL
ALT: 19 U/L (ref 0–35)
AST: 20 U/L (ref 0–37)
Albumin: 3.5 g/dL (ref 3.5–5.2)
Alkaline Phosphatase: 72 U/L (ref 39–117)
BUN: 7 mg/dL (ref 6–23)
CO2: 28 mEq/L (ref 19–32)
Calcium: 9.1 mg/dL (ref 8.4–10.5)
Chloride: 106 mEq/L (ref 96–112)
Creatinine, Ser: 0.86 mg/dL (ref 0.4–1.2)
GFR calc Af Amer: >60 mL/min (ref >60)
GFR calc non Af Amer: >60 mL/min (ref >60)
Glucose, Bld: 103 mg/dL — ABNORMAL HIGH (ref 70–99)
Potassium: 3.6 mEq/L (ref 3.5–5.1)
Sodium: 140 mEq/L (ref 135–145)
Total Bilirubin: 0.5 mg/dL (ref 0.3–1.2)
Total Protein: 5.7 g/dL — ABNORMAL LOW (ref 6.0–8.3)

## 2010-05-11 LAB — TSH: TSH: 2.062 u[IU]/mL (ref 0.350–4.500)

## 2010-05-11 LAB — URINALYSIS, ROUTINE W REFLEX MICROSCOPIC
Bilirubin Urine: NEGATIVE
Glucose, UA: NEGATIVE mg/dL
Hgb urine dipstick: NEGATIVE
Ketones, ur: NEGATIVE mg/dL
Nitrite: NEGATIVE
Protein, ur: NEGATIVE mg/dL
Specific Gravity, Urine: 1.015 (ref 1.005–1.030)
Urobilinogen, UA: 0.2 mg/dL (ref 0.0–1.0)
pH: 7 (ref 5.0–8.0)

## 2010-05-11 LAB — CBC
HCT: 39.5 % (ref 36.0–46.0)
Hemoglobin: 13.4 g/dL (ref 12.0–15.0)
MCHC: 33.8 g/dL (ref 30.0–36.0)
MCV: 92.3 fL (ref 78.0–100.0)
Platelets: 211 10*3/uL (ref 150–400)
RBC: 4.29 MIL/uL (ref 3.87–5.11)
RDW: 12.4 % (ref 11.5–15.5)
WBC: 5.6 10*3/uL (ref 4.0–10.5)

## 2010-05-11 LAB — CARBAMAZEPINE LEVEL, TOTAL: Carbamazepine Lvl: 6.4 ug/mL (ref 4.0–12.0)

## 2010-05-11 LAB — VALPROIC ACID LEVEL: Valproic Acid Lvl: 82.3 ug/mL (ref 50.0–100.0)

## 2010-05-12 LAB — DRUGS OF ABUSE SCREEN W/O ALC, ROUTINE URINE
Amphetamine Screen, Ur: NEGATIVE
Barbiturate Quant, Ur: NEGATIVE
Benzodiazepines.: NEGATIVE
Cocaine Metabolites: NEGATIVE
Creatinine,U: 85.7 mg/dL
Marijuana Metabolite: NEGATIVE
Methadone: NEGATIVE
Opiate Screen, Urine: NEGATIVE
Phencyclidine (PCP): NEGATIVE
Propoxyphene: NEGATIVE

## 2010-05-12 LAB — URINALYSIS, ROUTINE W REFLEX MICROSCOPIC
Bilirubin Urine: NEGATIVE
Glucose, UA: NEGATIVE mg/dL
Hgb urine dipstick: NEGATIVE
Ketones, ur: NEGATIVE mg/dL
Nitrite: NEGATIVE
Protein, ur: NEGATIVE mg/dL
Specific Gravity, Urine: 1.011 (ref 1.005–1.030)
Urobilinogen, UA: 0.2 mg/dL (ref 0.0–1.0)
pH: 7 (ref 5.0–8.0)

## 2010-05-12 LAB — PREGNANCY, URINE: Preg Test, Ur: NEGATIVE

## 2010-05-30 ENCOUNTER — Encounter: Payer: Self-pay | Admitting: Family Medicine

## 2010-05-30 ENCOUNTER — Ambulatory Visit (INDEPENDENT_AMBULATORY_CARE_PROVIDER_SITE_OTHER): Payer: PRIVATE HEALTH INSURANCE | Admitting: Family Medicine

## 2010-05-30 VITALS — BP 125/86 | HR 79 | Temp 97.9°F | Ht 69.0 in | Wt 230.4 lb

## 2010-05-30 DIAGNOSIS — M25519 Pain in unspecified shoulder: Secondary | ICD-10-CM

## 2010-05-30 DIAGNOSIS — M25511 Pain in right shoulder: Secondary | ICD-10-CM | POA: Insufficient documentation

## 2010-05-30 MED ORDER — OXYCODONE-ACETAMINOPHEN 5-325 MG PO TABS: 1.0000 | ORAL_TABLET | Freq: Four times a day (QID) | ORAL | Status: AC | PRN

## 2010-05-30 NOTE — Assessment & Plan Note (Signed)
concern for at least partial thickness rotator cuff (supraspinatus tear) with known injury, decreased range of motion (unable to go overhead actively) and u/s with findings of hypoechogenicity within tendon on bursal surface side.  Proceed with MRI to fully characterize extent of injury and will call patient with results.  If only strained, will proceed with nsaids and PT.

## 2010-05-30 NOTE — Patient Instructions (Signed)
I'm concerned you have at least a partial supraspinatus tear given the injury, markedly decreased range of motion. We will arrange for an MRI of your shoulder and call you with the date and time of this. Ice the area 15 minutes at a time 3-4 times a day. Take aleve 1-2 tabs twice a day with food for pain and inflammation in the meantime. Percocet as needed for severe pain. We will discuss results on the phone and how to proceed.

## 2010-05-30 NOTE — Progress Notes (Signed)
  Subjective:    Patient ID: [name], female    DOB: Apr 07, 1970, 40 y.o.   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 While doing so when pushing shovel down, felt a pop with immediate pain within right shoulder. No swelling or bruising. Tried to continue but could only do so if sitting and shovel was held with right arm at her side. + night pain Unable to get arm above head actively now No prior shoulder injuries Is right handed Has also developed pain into right side of neck No bowel/bladder dysfunction No numbness or tingling radiating into arm since injury.  Past Medical History  Diagnosis Date  . Bipolar disorder   . Eczema   . Pes planus   . Achilles tendinitis   . Hypertension     No current outpatient prescriptions on file prior to visit.    Past Surgical History  Procedure Date  . Tumor resection left thigh     Allergies  Allergen Reactions  . Morphine   . Penicillins   . Prednisone Hives    History   Social History  . Marital Status: Single    Spouse Name: N/A    Number of Children: N/A  . Years of Education: N/A   Occupational History  . Not on file.   Social History Main Topics  . Smoking status: Never Smoker   . Smokeless tobacco: Not on file  . Alcohol Use: Not on file  . Drug Use: Not on file  . Sexually Active: Not on file   Other Topics Concern  . Not on file   Social History Narrative  . No narrative on file    No family history on file.  BP 125/86  Pulse 79  Temp(Src) 97.9 F (36.6 C) (Oral)  Ht 5\' 9"  (1.753 m)  Wt 230 lb 6.4 oz (104.509 kg)  BMI 34.02 kg/m2  Review of Systems See HPI above.    Objective:   Physical Exam Gen: NAD R shoulder: No gross deformity, swelling or bruising. TTP lateral right shoulder but no focal TTP AC joint, other bony TTP. AROM 45 degrees abduction, 60 degrees flexion.  PROM  full Strength 3/5 with empty can - unable to hold up.  4+/5 with resisted IR/ER + hawkins Negative apprehension. NVI distally  L shoulder: FROM without pain, swelling, weakness.    MSK u/s: Brief u/s right shoulder shows supraspinatus muscle inserting into footplate without retraction.  However, noninsertional bursal surface tear appears probable with hypoechogenicity - does not appear to be full thickness if present.    Assessment & Plan:  1. R shoulder pain - concern for at least partial thickness rotator cuff (supraspinatus tear) with known injury, decreased range of motion (unable to go overhead actively) and u/s with findings of hypoechogenicity within tendon on bursal surface side.  Proceed with MRI to fully characterize extent of injury and will call patient with results.  If only strained, will proceed with nsaids and PT.

## 2010-06-01 ENCOUNTER — Ambulatory Visit (HOSPITAL_BASED_OUTPATIENT_CLINIC_OR_DEPARTMENT_OTHER)
Admission: RE | Admit: 2010-06-01 | Discharge: 2010-06-01 | Disposition: A | Discharge status: Home / Self Care | Payer: PRIVATE HEALTH INSURANCE | Source: Ambulatory Visit | Attending: Family Medicine | Admitting: Family Medicine

## 2010-06-01 DIAGNOSIS — M25419 Effusion, unspecified shoulder: Secondary | ICD-10-CM | POA: Insufficient documentation

## 2010-06-01 DIAGNOSIS — M25511 Pain in right shoulder: Secondary | ICD-10-CM

## 2010-06-01 DIAGNOSIS — M25519 Pain in unspecified shoulder: Secondary | ICD-10-CM | POA: Insufficient documentation

## 2010-06-06 ENCOUNTER — Ambulatory Visit: Payer: PRIVATE HEALTH INSURANCE | Admitting: Family Medicine

## 2010-06-08 ENCOUNTER — Ambulatory Visit: Payer: PRIVATE HEALTH INSURANCE | Attending: Family Medicine | Admitting: Rehabilitation

## 2010-06-08 DIAGNOSIS — M25519 Pain in unspecified shoulder: Secondary | ICD-10-CM | POA: Insufficient documentation

## 2010-06-08 DIAGNOSIS — IMO0001 Reserved for inherently not codable concepts without codable children: Secondary | ICD-10-CM | POA: Insufficient documentation

## 2010-06-08 DIAGNOSIS — M25619 Stiffness of unspecified shoulder, not elsewhere classified: Secondary | ICD-10-CM | POA: Insufficient documentation

## 2010-06-14 ENCOUNTER — Encounter: Payer: PRIVATE HEALTH INSURANCE | Admitting: Physical Therapy

## 2010-06-14 NOTE — H&P (Signed)
[name], [name]               ACCOUNT NO.:  [id number]   MEDICAL RECORD NO.:  [id number]          PATIENT TYPE:  IPS   LOCATION:  0506                          FACILITY:  [facility]   PHYSICIAN:  Geoffery Lyons, M.D.      DATE OF BIRTH:  Aug 09, 1970   DATE OF ADMISSION:  07/28/2006  DATE OF DISCHARGE:                       PSYCHIATRIC ADMISSION ASSESSMENT   IDENTIFYING INFORMATION:  This is a voluntary admission to the services  of Dr. Geoffery Lyons.  This is a 40 year old single white female.  She  presented as a walk-in yesterday afternoon about 4:00.  She could not  contract for safety.  She was having constant suicidal ideation again.  She felt that she left the hospital too soon.  Again, slept with her  mother last night to avoid hurting herself.  She states that her  employer does not want her to take disability and returned to work a  couple of days ago and was unable to function, constantly anxious and  preoccupied with suicidal ideation.  Indeed, she had a plan to cut her  wrist although she has not done so.  She states that she did this  earlier in May and she does have a history of cutting for stress relief.   When she last left on July 25, 2006, the idea was that she would go to a  lower level of responsibility at [facility].  She states that this has not  happened.  She was trying to work her sick time so that she would  qualify for disability through the company.  However, she states that  they have threatened her with termination.  She states that she and her  mom are due to see a lawyer regarding [name] having a healthcare power of  attorney, etc., and once again she was encouraged to just go ahead and  apply for social security disability as this level of stress induces  suicidal ideation.   PAST PSYCHIATRIC HISTORY:  She has had two prior admissions this month.  She came to [place] Jun 29, 2006 to July 03, 2006 and July 20, 2006 to July 25, 2006.   SOCIAL HISTORY:  There is no  change.   FAMILY HISTORY:  There is no change.   ALCOHOL/DRUG HISTORY:  There is no change.   PRIMARY CARE PHYSICIAN:  Her primary care Emanuelle Hammerstrom is Gearldine Bienenstock and  her current therapist is [employer].  She sees Dr. Geoffery Lyons on an  outpatient basis for psychiatry.   MEDICAL PROBLEMS:  Hypertension, obesity, elevated glucose.   ALLERGIES:  She has drug allergies to PENICILLIN, MORPHINE, ULTRAM and  PREDNISONE.   POSITIVE PHYSICAL FINDINGS:  She is an obese white female with no  significant physical findings.  Her vital signs on admission show she is  69 inches tall, weight is 246 pounds, temperature is 97.8, blood  pressure 114/75 to 113/78, pulse 73-70, respirations 16.   LABORATORY DATA:  Her potassium was slightly low at 3.4 and her glucose  was slightly elevated at 102.   CURRENT MEDICATIONS:  When discharged on July 25, 2006, she  was on  Zoloft 150 mg p.o. q.d., hydrochlorothiazide 25 mg, 1 p.o. q.d., Toprol  XL 50 mg p.o. q.d., lithium carbonate 600 mg in the morning, 300 mg in  the p.m., Depakote ER 500 mg, 2 at bedtime, Risperdal 1 mg, take 1/2  b.i.d. and a whole one at h.s., Xanax 0.5 mg b.i.d. and Ambien 10 mg at  h.s.   MENTAL STATUS EXAM:  Today, she is alert and oriented although she  appears distracted.  Her speech is not pressured.  Her mood is depressed  and anxious as is her affect.  Her thought processes are not completely  clear, rational or goal-oriented.  She really wants someone to make the  decision for her not to go back to work.  Judgment and insight are poor.  Concentration and memory are intact.  Intelligence is at least average.  She reports that she is still actively suicidal.  She is not homicidal  and she reports recently having seen peripheral shadows.   DIAGNOSES:  AXIS I:  Bipolar disorder not otherwise specified.  AXIS II:  Borderline personality disorder.  AXIS III:  Obesity, hypertension.  AXIS IV:  Occupational.  AXIS V:  35.    PLAN:  To admit for safety and stabilization.  To adjust her medications  if indicated and we will have the casemanager have a session with her  and her mom to help with contacting her employer and checking into  benefits, etc.      Vic Ripper, P.A.-C.      Geoffery Lyons, M.D.  Electronically Signed    MD/MEDQ  D:  07/29/2006  T:  07/29/2006  Job:  [id number]

## 2010-06-14 NOTE — Discharge Summary (Signed)
[name], [name]               ACCOUNT NO.:  [id number]   MEDICAL RECORD NO.:  [id number]          PATIENT TYPE:  IPS   LOCATION:  0507                          FACILITY:  [facility]   PHYSICIAN:  Geoffery Lyons, M.D.      DATE OF BIRTH:  12/08/1970   DATE OF ADMISSION:  09/26/2006  DATE OF DISCHARGE:  10/09/2006                               DISCHARGE SUMMARY   CHIEF COMPLAINT AND PRESENT ILLNESS:  This was one of multiple  admissions to [facility] for this 40 year old female  voluntarily admitted.  History of depression, suicidal thoughts,  thinking about multiple ways of killing herself.  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  Multiple stressors, lost her job, problem with  the mother, unable to drive, difficulty with memory, feeling a burden to  others, wanted to kill herself.   PAST PSYCHIATRIC HISTORY:  Multiple inpatient stays, multiple medication  trials.  After last hospitalization, referred for ECT at [facility].   ALCOHOL/DRUG HISTORY:  Denies and there is no evidence of any active  substance abuse.   MEDICAL HISTORY:  Arterial hypertension, migraines.   MEDICATIONS:  Ambien 10 mg at bedtime, Toprol XL 50 mg per day,  hydrochlorothiazide 25 mg per day, Ativan 1 mg as needed.   PHYSICAL EXAMINATION:  Performed and failed to show any acute findings.   LABORATORY DATA:  White blood cells 7.7, hemoglobin 13.1.  Sodium 143,  potassium 3.4, replaced to 3.6, glucose 97, BUN 9, creatinine 0.79.  SGOT 18, SGPT 17, TSH 1.123.   MENTAL STATUS EXAM:  Alert, cooperative female.  Fair eye contact.  Speech clear, normal rate, tempo and production.  Mood feeling hopeless,  helpless, depressed.  Affect depressed, anxious.  Thought processes  logical, coherent and relevant.  Talks about suicide, wanting to give  up, hopeless, helpless, feeling that everyone would be better off  without her, having a hard time dealing with the issues with memory as  well as the headache.  Suicidal thoughts.  No homicidal ideas.  No  delusions.  No hallucinations.  Cognition well-preserved.   ADMISSION DIAGNOSES:  AXIS I:  Bipolar disorder, depressed.  AXIS II:  No diagnosis.  AXIS III:  Migraines, arterial hypertension.  AXIS IV:  Moderate.  AXIS V:  GAF upon admission 25; highest GAF in the last year 70.   HOSPITAL COURSE:  She was admitted and started in individual and group  psychotherapy.  As already stated, a 35-year female with bipolar  disorder, depressed with psychotic features, being treated with ECT.  Endorsed persistent memory loss since she started getting that the  treatment.  The memory loss has been causing more irritability and  frustration, really upset.  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  Saw her primary care [name] Garbers.  She  admitted to suicidal thoughts.  Was referred for inpatient treatment.  On September 28, 2006, continued to have suicidal thoughts, unable to  contract for safety, was  placed on one-on-one, upset, frustrated,  headaches, not going away, irritable, agitated, very labile.  We went  ahead and resumed the lithium and the Risperdal as she is still unable  to contract for safety.  She was actively receiving ECT, endorsed the  memory loss, did not feel any benefit other than initially after the  first treatment.  Apparently, she was on maintenance and she was going  to be administered it unilateral to see if she would be able to recover  some of the memory back.  Continued to have a hard time.  Endorsed  feeling awful, wanting to die, none of the things that we did were  helping with the headaches.  We consulted neurology.  On October 02, 2006, seen endorsing persistent depression, irritable, agitated,  suicidal thoughts.  One-on-one.  We increased the lithium and the  Risperdal.  There was some notification  that, due to the fact that she  was placed on the lithium, that she was not going to able to have the  ECT treatment, that she was scheduled to have done.  On October 03, 2006, still pretty labile.  We discontinued the lithium, maintained the  Risperdal as recommendation from the staff at [facility].  On  October 04, 2006, she was focused on the headache.  Endorsed no other  option for herself but to kill herself.  Called her pastor to arrange  for the eulogy.  Also told mother to respect her wishes of being  cremated.  Wanted to be discharged so she can go on and take care of  business, that is kill herself.  Lithium as already stated was  discharged as per Dr. Alycia Rossetti as he was not going to be able to do ECT but  Risperdal was clear.  Continued to endorse feeling like wanting to hurt  herself.  Endorsed depression, labile, irritable.  We spoke with Dr.  Alycia Rossetti who upon reassessment of her case in the presence of subjective  memory loss and the headaches and little benefit from the ECT, that he  would rather not continue the ECT treatment.  She accepted his decision,  yet upset, teary-eyed but did say that she would rather not have any  more ECT, not sure of the benefit after all.  A sense of hopelessness  and helplessness.  Did say that she was afraid, overwhelmed.  We  continued to work on Pharmacologist.  We pursued medications further.  We  started decreasing the one-on-one observation to waking hours.  On  October 06, 2006, she was thinking that she wanted to be considered to  finish the course and was wanting to stay off medication that could  jeopardize her ECT.  The headache seemed to be a little better.  Since  the headache got better, there was less of an urge to hurt herself.  We  discharged the one-on-one observation.  We called Dr. Alycia Rossetti again and he  was pretty firm in that he did not want to pursue the ECT anymore.  There was a lot of family issues in terms of the  relationship with her  parents, who are divorced, among themselves and with her but she was  able to deal with all these stressors.  On October 08, 2006, we started  Tegretol 200 mg twice a day.  Family session with the mother.  They were  able to deal with some issues that were causing some conflict and there  was some improvement.  Once the decision was not to pursue ECT, then we  were able to pursue Tegretol.  She was also on the Risperdal.  She was  wanting to be given a trial to be discharged and see how she was going  to be able to handle it with close outpatient follow-up.  She was  endorsing no active suicidal ideations.  On October 08, 2006, we  discharged.  She did say that the family session went better than  expected, feeling stable enough, not suicidal, that she can safely be  handled on an outpatient basis.   DISCHARGE DIAGNOSES:  AXIS I:  Bipolar disorder, depressed.  AXIS II:  No diagnosis.  AXIS III:  Headaches, arterial hypertension, status post ECT memory  loss.  AXIS IV:  Moderate.  AXIS V:  GAF upon discharge 50.   DISCHARGE MEDICATIONS:  1. Risperdal 1 mg, 1/2 twice a day and 1 at bedtime.  2. Topamax 25 mg twice a day.  3. Toprol XL 50 mg per day.  4. Hydrochlorothiazide 25 mg per day.  5. Tegretol 200 mg twice a day.  6. Ultram 50 mg, 1-2 every 4-6 hours as needed for headache.  7. Xanax 0.5 mg twice a day as needed for anxiety.   FOLLOW UP:  Dr. Dub Mikes at [facility] and Cleda Clarks of  [facility] and Dr. Vickey Huger for her headaches.      Geoffery Lyons, M.D.  Electronically Signed     IL/MEDQ  D:  11/05/2006  T:  11/06/2006  Job:  [id number]

## 2010-06-14 NOTE — H&P (Signed)
[name], [name]               ACCOUNT NO.:  [id number]   MEDICAL RECORD NO.:  [id number]          PATIENT TYPE:  IPS   LOCATION:  0307                          FACILITY:  [facility]   PHYSICIAN:  Geoffery Lyons, M.D.      DATE OF BIRTH:  Jun 17, 1970   DATE OF ADMISSION:  03/20/2007  DATE OF DISCHARGE:                       PSYCHIATRIC ADMISSION ASSESSMENT   IDENTIFICATION:  [name], single.  This is a  voluntary admission.   CHIEF COMPLAINT:  I feel like I am losing my mind.   HISTORY OF PRESENT ILLNESS:  This 40 year old who is well-known to [name]  presents with 2 days of suicidal thoughts after having an intense  argument with her mother.  She returned home after discharge from [facility] on  February 5 and got through one big argument with her mother, then things  went a little bit smoother at home,  then had another argument with her  mother on Tuesday.  She feels that she is unable to do anything right  for her mother.  Her mother feels that she is lazy and not doing enough  around the house.  The patient says that she is cleaning and trying to  do her part and asked the mother to leave her a list which she wants  done, but the mother has not done that.  She reports being repeatedly  challenged by her mother on Tuesday to just go ahead and kill  yourself, and that set off a series of very intense suicidal thoughts  and vivid nightmares with visions of her own death.  No history of  substance abuse.  No homicidal thoughts.  She is calm and coherent and  appears depressed and feels very hopeless about getting along with her  mother.   PAST PSYCHIATRIC HISTORY:  [name] with  the most recent being January 26 to February 5 for which she was treated  for depression, bipolar disorder and depressed and August 27 to  September 9.  She has a history of ECT for depressed phase of her  bipolar disorder with some retrograde amnesia.  She has reported a  lot  of chronic conflict with her mother that is one of her aggravating  factors and chronic conflict with her brother who diminishes her illness  feeling that treatment is not needed.   SOCIAL HISTORY:  Single white female, previously worked in Engineering geologist, has  not worked in about a year.  Has filed for disability for chronic mental  illness, never married.  No children.  Currently living with her mother.  Her father also lives nearby and is supportive, and she has a brother  who also lives in town.  Says that living with the brother or the father  are not an option.  She is hoping to live on her home and get her own  place and was living with the mother in order to build up adequate  savings to move out on her own.   MEDICAL HISTORY:  The patient is followed at [facility].  By Dr. Brigid Re.   MEDICAL PROBLEMS:  1. History of migraine headaches.  2. Achilles tendinitis in her right foot, current.   PAST MEDICAL HISTORY:  Remarkable for a breast biopsy in December 2008  with no acute findings.   CURRENT MEDICATIONS:  The patient is currently wearing a Cam boot on her  right foot.  Current medications are:  1. Xanax 0.5 mg p.o. b.i.d..  2. Hydrochlorothiazide 25 mg p.o. daily.  3. Toprol XL 100 mg daily.  4. Tegretol 200 mg p.o. t.i.d.  5. Abilify 5 mg p.o. q.a.m. and 15 mg at bedtime.  6. Trazodone 300 mg q.h.s.  7. Depakote ER 1000 mg p.o. q.h.s.   DRUG ALLERGIES:  MORPHINE PREDNISONE AND PENICILLIN.   REVIEW OF SYSTEMS:  CONSTITUTIONAL:  Denies any fever or chills.  She  says that sleep has been poor for about 3 days with frequent awakenings  and vivid dreams.  CARDIAC:  No palpitations.  Denies chest pain.  RESPIRATORY:  No wheeze.  No cough.  Denies any shortness of breath with  exertion.  Weight is stable at 269 pounds.  MUSCULOSKELETAL:  She is  having some problems with some pain in her right heel and foot, was  diagnosed with  tendinitis, no other muscular stiffness.  Denies symptoms  of EPS.  UA:  No dysuria.  No polyuria.  GI:  Bowels are regular without  medication.   PHYSICAL EXAMINATION:  GENERAL:  Well-nourished, well-developed female,  healthy in appearance.  VITAL SIGNS:  Weight 269 pounds, temperature 97.3, pulse 76,  respirations 16, blood pressure 126/88.  She is about 5 feet 11 inches  tall.  HEENT:  Head:  Normocephalic and atraumatic.  EENT:  PERRL.  Sclera  nonicteric.  Oropharynx within normal limits.  AC and PC nodes are  negative.  Dental hygiene is good.  NECK:  Supple.  No thyromegaly.  CHEST:  Clear to auscultation.  CARDIOVASCULAR:  S1-S2 is heard.  No clicks, murmurs or gallops.  Apical  pulse now is 68.  ABDOMEN:  Soft, nontender, nondistended, rounded, normal bowel sounds.  GENITOURINARY:  Deferred.  EXTREMITIES:  Pink and warm, 2+ pulses.  No clubbing, no cyanosis.  SKIN:  Clear.  No rash.  No scars.  No signs of self-mutilation.  NEUROLOGICAL:  Cranial nerves II-XII are intact.  Extraocular movements  are normal.  No nystagmus.  Visual tracking is normal.  PERRL.  Motor is  intact and smooth.  Gait is normal with normal arm swing.  Cerebellar is  intact.  Neuro is nonfocal.  No cogwheeling, rigidity or stiffness.   DIAGNOSTIC STUDIES:  Chemistry:  Sodium 137, potassium 3.7, chloride  101, carbon dioxide 31, BUN 11, creatinine 0.88, random glucose 93.  Liver enzymes:  SGOT 18, SGPT 25, alkaline phosphatase 73 and total  bilirubin 0.7, albumin within normal limits at 3.7, normal calcium at  9.2.  Carbamazepine level at 4.9 and a valproate level 49.2.  Routine  urinalysis shows trace of ketones, but otherwise no acute findings.   MENTAL STATUS EXAM:  Fully alert female, pleasant cooperative.  Affect  is blunted.  She appears sad, appears depressed.  Expresses a lot of  frustration in dealing with her mother.  Had several good days visiting  with her.  They were doing activities  together, then when they get in an  argument she feels that she is unable to please her mother.  Mother  taunting her to go ahead  and commit  suicide.  Feels very worthless when  this happens, very hopeless.  No reason to go on living.  Speech is  normal in pace, tone and amount.  Mood is depressed.  Thought processes  logical and coherent, goal directed.  Positive for suicidal thoughts  without a particular plan at this point.  No homicidal thought.  Cognition is fully preserved.   DIAGNOSES:  AXIS I:  Bipolar disorder depressed.  AXIS II:  No diagnosis.  AXIS III:  Achilles Tendinitis by history, history of migraine headaches  and hypertension.  AXIS IV:  Severe issues with family conflict.  AXIS V:  Current 41 past year 67 low.   PLAN:  Voluntarily admit the patient with q. 15-minute checks in place.  We will hope to get her family involved in care here, maybe possibly  have a family session with her mother.  Going to continue her current  medications at this time, and she is enrolled in a depression group.  Estimated length of stay is 5 days.      Margaret A. Scott, N.P.      Geoffery Lyons, M.D.  Electronically Signed    MAS/MEDQ  D:  03/21/2007  T:  03/22/2007  Job:  [id number]

## 2010-06-14 NOTE — Discharge Summary (Signed)
NAMEMEEGAN, SHANAFELT               ACCOUNT NO.:  [id number]   MEDICAL RECORD NO.:  [id number]          PATIENT TYPE:  IPS   LOCATION:  0307                          FACILITY:  [facility]   PHYSICIAN:  Geoffery Lyons, M.D.      DATE OF BIRTH:  1970-11-01   DATE OF ADMISSION:  03/20/2007  DATE OF DISCHARGE:  04/01/2007                               DISCHARGE SUMMARY   CHIEF COMPLAINT AND HISTORY OF PRESENT ILLNESS:  This was one of  multiple admissions to [facility] for this 40 year old  white female, single voluntarily admitted. Endorsed 2 days of suicidal  thoughts after having an intense argument with her mother. Returned home  after discharge from [facility] February 5, got into one big  argument with her mother. Then things went a little bit smoother.  Then  had another argument with her mother on Tuesday, felt like she was  unable to do things right for her mother.  Mother felt that she was  lazy, not doing enough around the house. She endorsed having been  challenged by her mother on Tuesday to just go ahead and kill herself  and that set off a series of very intense suicidal thoughts and  nightmares, visions of her own death.   PAST PSYCHIATRIC HISTORY:  One of several admissions to [facility], most recently January 26 to February 5. Had been  treated for bipolar disorder.  Had a course of ECT with some retrograde  amnesia.   ALCOHOL AND DRUG HISTORY:  Denies active use of any substances.   MEDICAL HISTORY:  Migraine headaches.  Achilles, then denies this, right  foot. Noncontributory.   No medical history of migraine headaches,  Achilles tenonitis,  hypertension.   MEDICATIONS:  1. Xanax 0.5 twice a day.  2. Hydrochlorothiazide 35 mg per day.  3. Toprol XL 100 mg per day.  4. Tegretol 200 mg three times a day.  5. Abilify 5 mg in the morning, 15 at night.  6. Trazodone 200 mg at night.  7. Depakote ER 1000 at bedtime.   Physical  exam failed to show any acute findings.   LABORATORY WORK:  Sodium 137, potassium 3.7, BUN 11, creatinine 0.88,  glucose 93, SGOT 18, SGPT 25, bilirubin 0.7. Tegretol level 4.9.  Depakote level of 49.2.   MENTAL STATUS EXAM:  Reveals an alert, cooperative female. Mood  depressed.  Affect constricted.  Thought processes logical, coherent and  relevant.  Endorsed wanting to die, persistent suicidal thoughts,  frustration with her mother. Feeling worthless, hopeless, helpless, no  reason why to go on. No delusions.  No hallucinations.  Cognition well  preserved.   ADMISSION DIAGNOSES:  AXIS I:  Bipolar disorder, depressed.  AXIS II:  No diagnosis.  AXIS III:  1. Migraine headaches.  2. Hypertension.  3. Achilles tenonitis.  AXIS IV:  Moderate.  AXIS V:  GAF upon admission 35, highest in the last year 70.   COURSE IN THE HOSPITAL:  She was admitted.  She was started in  individual and group psychotherapy.  Initially was  maintain on Depakote,  Tegretol, hydrochlorothiazide, Toprol XL, Xanax, and Abilify. Given the  fact that the Abilify was not as effective at the Risperdal, we went  ahead and switched to Risperdal. As already stated, a 40 year old  female, single, in one of multiple admissions to [facility]. In the last 7-10 days has been increasingly more depressed with  thoughts of cutting herself, killing herself, cut deep and deeper.  The  worst thing is she had a fight with her mother, and the mother told her  to go ahead and kill herself. Cannot get her thoughts out of her mind,  persistent ruminations of going ahead and doing it.  Decreased sleep,  decreased appetite, unable to concentrate, cannot take the thoughts out  of her mind. Went to see the therapist twice, talked to her pastor,  tried some medication changes, but it did not work. To try to avoid  inpatient treatment, but, due to the acute suicidal thoughts with plan,  we went ahead and admitted for  safety reasons. Tegretol level was 4.9.  Depakote level was 49.2.  We went ahead and changed to Risperdal and  tried to optimize treatment with the Risperdal.  She continued to  endorse mood fluctuations, ruminations, mood swings, irritability,  wanting to go off with sad ruminations. Difficulty with negative  thoughts, wanting to cut. Did not trust herself.  We continued to  increase the Risperdal as tolerated.  By February 21, she was sleeping a  little bit better. She had been falling asleep with the first dose of  trazodone, having to wake up and take the second one. Continued to be  upset. There was some issue in the unit with some other patients that  got her even more upset. Still ruminating about hurting herself and  about cutting.  February 23, continued to endorse having a very hard  time. Woke up in the middle of the night with feeling of wanting to hurt  herself. She could endorsed that she was able to deal with the thoughts  when she was in the hospital, very scared what to do when she was out.  She was struggling with her identity, confused in terms of her sexual  orientation. This definitely increased her stress level, and she felt  that was going create even more conflict with her mother.  February 24,  dealing with mood fluctuations, wanting to cut. Sense of hopelessness,  helplessness, identity issues, lack of support from the mother.  We  increased the Depakote. Level was now 61.1 from 49.2.  We worked with  Special educational needs teacher. She continued to have a hard time with her mood swings,  having a hard time interacting with other people, dealing with thoughts  of wanting to cut or die. Endorsed that she was pretty much accepting  that the thoughts were going to be with her. She accepted the fact that  the suicidal thoughts might continue to come.  We started working on  strategies not to act on them. She continued to deal with identity  issues.  If she were to come out to her  family, endorsed that was going  to be the end of the relationship, and she did not feel that she was  strong enough not to have her family by her. She continued to deal with  the identity issues. We continued to work on coping skills, developed a  supportive approach, problem solving, CBT. She develop a cough, could  not sleep.  Discharge was planned for February 28. She did not feel safe  to go.  We cancelled the discharge. She continued to feel depressed but  better than the day before on March 1, so the psychiatrist on call that  weekend continued to defer the discharge until seen by this physician.  Did endorse that the weekend was very hard. March 2, Monday, she was  starting to feel a little better, and  March 2, it  was felt that she  had obtained full benefit from the hospitalization. She was willing to  pursue outpatient treatment. She was endorsing no active suicidal  ideations.  Overall felt better, was more comfortable with the  medication regimen that she was placed, so we went ahead and discharged  to outpatient followup.   DISCHARGE DIAGNOSES:  AXIS I:  1. Bipolar disorder, depressed.  2. 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  3. Anxiety disorder not otherwise specified.  AXIS II:  No diagnosis.  AXIS III:  1. Hypertension.  2. Achilles tenonitis.  3. Migraine headaches.  AXIS IV:  Moderate.  AXIS V:  Upon discharge 50-55.   DISCHARGE MEDICATIONS:  1. Tegretol 200 mg three times a day and at bedtime.  2. Hydrochlorothiazide 25 mg per day.  3. Toprol XL 100 mg per day.  4 . Xanax 0.5 one twice a day.  1. Risperdal M tablet 0.5 twice a day and 30 mg at night.  2. Depakote ER 500 mg three at night.  3. Trazodone 100 mg two to three at night.  4. Vicodin 5/325 one three times a day as needed.  5. Motrin 100 mg every 8 hours as needed for pain.  6. Advair  Diskus 50/100 one puff twice a day as needed.   [facility]  and [facility].      Geoffery Lyons, M.D.  Electronically Signed     IL/MEDQ  D:  04/30/2007  T:  05/01/2007  Job:  [id number]

## 2010-06-14 NOTE — Discharge Summary (Signed)
NAMESONITA, MICHIELS               ACCOUNT NO.:  [id number]   MEDICAL RECORD NO.:  [id number]          PATIENT TYPE:  IPS   LOCATION:  0305                          FACILITY:  [facility]   PHYSICIAN:  Geoffery Lyons, M.D.      DATE OF BIRTH:  28-Jan-1971   DATE OF ADMISSION:  04/29/2008  DATE OF DISCHARGE:  05/06/2008                               DISCHARGE SUMMARY   CHIEF COMPLAINT AND HISTORY OF PRESENT ILLNESS:  This was one of  multiple admissions to [name] for this 40 year old  female voluntarily admitted.  Endorsed mood fluctuations over the last 2  months.  Things got worse after she could not get the Risperdal as she  was not able to get the refill through the patient assistance program.  Became more ruminating, preoccupied, some thoughts of suicide in class.  Learned of her biblical interpretation that people commit suicide do not  go to hell so she felt that that was an option for her and started  ruminating again about suicide, thoughts of walking into traffic.   PAST PSYCHIATRIC HISTORY:  Bipolar disorder, multiple admissions but  really stable in the last several months, active [employer].   ALCOHOL AND DRUG HISTORY:  No active use of any substances.   MEDICAL HISTORY:  Migraine headaches.   MEDICATIONS:  1. Risperdal 3 mg at night and 0.5 twice a day.  2. Tegretol XR 400 mg twice a day.  3. Depakote ER 2000 at night.  4. Xanax 0.25 twice a day.  5. Trazodone 100 mg at night.  6. Toprol XL 100 mg per day.  7. Hydrochlorothiazide 25 mg per day.  8. Imitrex injection as needed.   PHYSICAL EXAMINATION:  Failed to show any acute findings.   LABORATORY WORK:  White blood cell 5.6, hemoglobin 13.4, sodium 140,  potassium 3.6, glucose 103, SGOT 20, SGPT 19, total bilirubin 0.5, TSH  2.062.  Depakote level 32.3, Tegretol level 6.4.  Drug screening  negative for substances of abuse.   MENTAL STATUS EXAM:  Reveals an alert and cooperative female who  endorsed has  been really overwhelmed, ruminating about suicide.  Concerned of this decompensation as she had been doing really well in  school, wanting to get herself back together and be out of the hospital  and back at school as soon as possible.  No homicidal ideas.  No  delusions.  No hallucinations.  Cognition well-preserved.   AXIS I:  Bipolar disorder.  AXIS II:  No diagnosis.  AXIS III:  Migraine headaches, hypertension.  AXIS IV:  Moderate.  AXIS V:  Upon admission 35-40, highest GAF in the last year 70-75.   COURSE IN THE HOSPITAL:  She was admitted, started individual and group  psychotherapy.  As already stated she has done really well at [employer].  Last semester got a 4.0.  This semester not as well due to a lot of  reading.  There was concern about her attention span and her ability to  maintain focus.  The amount of reading is increasing the stress.  In the  past  she had been assessed to have a rule out of ADHD.  Ran out of  Risperdal, new paperwork.  Has had racing thoughts and the class that  told her that people who commit suicide did not go to hell became  trigger for thoughts about suicide.  We again considered the possibility  of ADHD.  She has persistent symptoms suggestive of the same so we tried  Adderall that she tolerated quite well up to 15 twice a day.  She was  able to sleep and read while in the unit and comprehend as she has not  been able to before.  The Adderall did not affect her mood in the  negative way.  She was encouraged.  She was motivated.  She was back on  her medications and on April 7 she was in full contact with reality.  Had a successful trial with the Adderall without any mood changes.  If  anything reporting improvement.  Felt ready to be discharged and resume  her school work.   DISCHARGE DIAGNOSES:  AXIS I:  Bipolar disorder, attention deficit  hyperactivity disorder rule out.  AXIS II:  No diagnosis.  AXIS III:  Migraine headaches and  hypertension.  AXIS IV:  Moderate.  AXIS V:  GAF on discharge 55-60.   DISCHARGE MEDICATIONS:  1. Discharged on Risperdal 1 mg twice a day and 3 mg at bedtime.  2. Tegretol XR 200 mg two twice a day.  3. Depakote ER 500 four at night.  4. Xanax 0.25 one twice a day as needed.  5. Trazodone 100 mg two at night.  6. Toprol XL 100 mg per day.  7. Hydrochlorothiazide 25 mg per day.  8. Adderall 20 twice a day.   FOLLOWUP:  Follow up at [facility] counseling center.      Geoffery Lyons, M.D.  Electronically Signed     IL/MEDQ  D:  05/29/2008  T:  05/29/2008  Job:  [id number]

## 2010-06-14 NOTE — H&P (Signed)
[name], [name]               ACCOUNT NO.:  [id number]   MEDICAL RECORD NO.:  [id number]          PATIENT TYPE:  IPS   LOCATION:  0500                          FACILITY:  [facility]   PHYSICIAN:  Vic Ripper, P.A.-C.DATE OF BIRTH:  November 17, 1970   DATE OF ADMISSION:  06/29/2006  DATE OF DISCHARGE:                       PSYCHIATRIC ADMISSION ASSESSMENT   IDENTIFYING INFORMATION:  This is a 40 year old single white female.  The patient presented last night after calling Assessment.  She reported  that she had been having increasing suicidal ideation over the past 3  weeks with rapid mood cycling, thoughts of stabbing her mother.  These  are intrusive thoughts.  She reports having cut her wrists 3 weeks ago,  the day after seeing Dr. Dub Mikes.  She also went in vacation shortly after  doing that.  She thought that her mood and thoughts would improve;  however, she was in [place] and [place] for a week, she has  returned and just has not gotten better.  She thought she would be  happy, but she is not.  She is currently working at [employer]. 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  She is having constant  suicidal/homicidal ideations.  She just feels like what's the point of  getting up, I'm bipolar, and she also reports issues initiating sleep  and denies issues with her appetite at this time.  She did have a sleep  study done back in January, and it was felt that she would not benefit  from CPAP at this point in time.  She was encouraged to lose weight,  have moderate exercise, and not to sleep in the supine position.   PAST PSYCHIATRIC HISTORY:  She has had numerous prior inpatient and  outpatient care.  Her last inpatient visit with [place] was October 29 to  December 01, 2005, and she is currently in outpatient care with Dr. Dub Mikes.   SOCIAL HISTORY:  She had one year of college.  She states she was  diagnosed as bipolar Jun 01, 1998.  She has never married.  She has no  children.  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   FAMILY HISTORY:  Her father was bipolar, he will not take his  medications.  She does not seem him as the parents are divorced.   ALCOHOL AND DRUG HISTORY:  She denies.   Primary care physician is Nilda Simmer, M.D., and she sees Dr. Dub Mikes on  an outpatient basis.   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  She also had a mass off her chest wall removed  that was also benign in 1999.  She has no other medical problems at this  point in time.   REVIEW OF SYSTEMS:  Negative.   PHYSICAL EXAMINATION:  Vital signs on admission:  She is 69 inches tall,  she weighs 245 pounds, temperature is 98.4, blood pressure is 153/100 to  146/111, and respirations are 22.  The remainder of her physical  examination was unremarkable.   MENTAL STATUS EXAMINATION:  Today she is alert and oriented x4.  She is  appropriately groomed, dressed and nourished.  Her speech is of normal  rate, rhythm and tone.  Her mood is depressed.  Her affect is congruent.  Her thought processes are clear, rational and goal-oriented.  She wants  to get her medications right.  Judgment and insight are intact.  Concentration and memory are good.  Her intelligence is at least  average.  She does not feel that she would be safe if released at this  time, and she feels that she is quite concerned about potential  homicidal ideations.  She states that her mother had asked her for a  knife to cut a watermelon and she had thoughts of hurting her mother.  She denies auditory or visual hallucinations.   DIAGNOSIS:  Axis I:  Bipolar, rapid cycling.  Axis II:  Borderline personality disorder.  Axis III:  Obesity and now hypertension.  Axis IV:  Severe occupational issues.  Axis V:  35.   PLAN:  The plan is to admit for safety and stabilization.  We will   adjust her medications.  Toward that end, we will increase her lithium.  We are waiting on a lithium level, and will increase her Loxitane.  She  is hoping to be able to go back to work by Wednesday if possible.      Vic Ripper, P.A.-C.     MD/MEDQ  D:  06/30/2006  T:  06/30/2006  Job:  [id number]

## 2010-06-14 NOTE — H&P (Signed)
[name], [name]               ACCOUNT NO.:  [id number]   MEDICAL RECORD NO.:  [id number]          PATIENT TYPE:  IPS   LOCATION:  0304                          FACILITY:  [facility]   PHYSICIAN:  Geoffery Lyons, M.D.      DATE OF BIRTH:  [birth year]   DATE OF ADMISSION:  08/23/2006  DATE OF DISCHARGE:                       PSYCHIATRIC ADMISSION ASSESSMENT   TIME OF ASSESSMENT:  1550 hours.   IDENTIFYING INFORMATION:  This is a 40 year old, single, white female.  This is a voluntary admission.   HISTORY OF PRESENT ILLNESS:  This pleasant, 40 year old, single female  is well known to [place].  She has presented with recurrence of significant  suicidal thoughts.  She was previously discharged about 3 weeks ago from  [facility] and was stable for a while but now  reports daily, fairly constant suicidal thoughts for the past week.  She  has been making various plans, the most persistent of which is to wait  until her father is gone from home to pay his insurance bill and during  the 2-hour window he is gone she plans to cut her wrists.  Her plan is  specific.  Her intent is clear.  She does have access to means.  She  also reports auditory hallucinations daily with voices calling her name.  Other aggravating factors for her depression include the fact that she  is unable to work due to her depression.  She has recently lost a job.  Because of lack of funds, she will lose access to her car and  transportation.  No issues with substance abuse.   PAST PSYCHIATRIC HISTORY:  The patient is followed as an outpatient by  Dr. Geoffery Lyons at [facility].  She has history  of bipolar disorder diagnosed in May 2002.  This is her 4th [facility]  admission in the last 12 months with her last admission being June 28 to  August 03, 2006.  She has history of first known admission for mental  illness at [facility] in 2000.  She also has history of prior  admissions  to [facility] and [facility].  She has been diagnosed with comorbid personality  disorder NOS with borderline traits and has had DBT therapy in the past.   SOCIAL HISTORY:  Single white female.  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  No history of  substance abuse.  She is currently living with her father and endorsing  significant financial stressors.  Never married.  No children.   MEDICAL HISTORY:  Remarkable for history of hypertension.  The patient  is followed by Dr. Nilda Simmer at [facility] in [place] on  [address]  Dental pain post extraction.   PAST MEDICAL HISTORY:  She denies any history of seizures, blackouts,  memory loss or traumatic brain injury.  No history of myocardial  infarction, kidney disease, or liver disease.  Positive for  history of  hypertension.  History of benign chest tumor in 1999.  History of benign  tumor in her left leg in January 2006.   ALCOHOL AND DRUG HISTORY:  No history of substance use.  She is a  nonsmoker.   FAMILY HISTORY:  Remarkable for mother with history of hypertension and  hypercholesterolemia.  Father with history of hypertension and bipolar  disorder.   CURRENT MEDICATIONS:  1. Toprol XL 75 mg daily.  2. HCTZ 25 mg daily.  3. Zoloft 200 mg daily.  4. Risperdal 0.5 mg b.i.d. and nightly.  5. Lithium 600 mg orally b.i.d.  6. Depakote ER 1000 mg orally nightly.  7. Ambien 10 mg orally nightly.  8. She is also currently on Vicodin 5/500 mg 1 tab every 6 hours      p.r.n. for pain following a tooth extraction 3 days prior to this      admission.  No signs of infection.   DRUG ALLERGIES:  Penicillin, morphine, prednisone and Ultram.   POSITIVE PHYSICAL FINDINGS:  GENERAL:  This is a well-developed, well-  nourished female who is in no acute distress.  She  is pleasant and  cooperative.  VITAL SIGNS:  She is 5' 9 tall and weighs 250 pounds.  Temperature  97.4, pulse 73, respirations 18, and blood pressure 118/70.  She is in  no distress.  HEENT:  Head is normocephalic and atraumatic.  EENT:  PERRL.  Sclerae  nonicteric.  Extraocular movements are normal.  Ocular tracking is  within normal limits.  NECK:  Neck is supple.  No thyromegaly.  No lymphadenopathy.  CHEST:  Clear to auscultation.  BREASTS:  Breast examination deferred.  CARDIOVASCULAR:  S1 and S2 are heard.  No clicks, murmurs, or gallops.  Regular rate.  No skipped beats.  Synchronous with radial pulse.  Peripheral pulses are 2+ throughout.  No irregular heart sounds.  ABDOMEN:  Flat, soft, nontender, nondistended.  Bowel sounds are within  normal limits.  PELVIC:  Deferred.  GENITOURINARY:  Deferred.  EXTREMITIES:  No signs of edema.  No pitting.  No swelling.  No  cyanosis.  SKIN:  Intact.  No rashes.  Pale skin with hair distribution consistent  with age and sex.  NEUROLOGIC:  Cranial nerves II-XII are intact.  Romberg without  findings.  Examination is nonfocal.  Gait is normal.  No ataxia.  No  signs of EPS.   DIAGNOSTIC STUDIES:  CBC revealed WBC 8.2, hemoglobin 13.9, hematocrit  40.6, and platelets 303,000.  Chemistries revealed sodium 141, potassium  3.3, chloride 109, carbon dioxide 27, BUN 7, creatinine 0.67, and random  glucose 99.  Calcium within normal limits at 9.5.  Her initial lithium  level here at the time of admission is 0.96 and valproate level 70.3.   MENTAL STATUS EXAMINATION:  She is a fully alert female, pleasant,  cooperative, with affect flattening and some mild motor slowing.  She  does appear detached although she is polite, cooperative, and very  directable.  Speech is soft in tone but otherwise normal in production  and fluency.  Mood is very depressed.  She does talk about the constant  thoughts of suicide and cannot get them out of her head.   She feels that  she really has no reason to live and is having difficulty  being safe  her on the unit.  She is able to contract for safety at this time, but  has been reclusive to the room.  She fears that if she gets up and gets  too much stimulation that she is not going to be able to control her  suicidal thoughts.  She does have urges to cut and harm herself.  Her  concentration and memory are intact.  Judgment is intact.  Calculation  and concentration are intact.  Cognition is preserved.   IMPRESSION:  AXIS I:  Bipolar disorder, depressed.  AXIS II:  Personality disorder, NOS, with borderline features.  AXIS III:  Hypertension, mild dental pain following extraction, and mild  hypokalemia.  AXIS IV:  Moderate issues with occupational and financial issues.  Having a stable home to return to and a father who is supportive of her  is an asset.  AXIS V:  Current 38.  Past year 33.   PLAN:  The plan is to voluntarily admit the patient with every 15 minute  checks in place to alleviate her suicidal thoughts.  We are going to  give her potassium today, 40 mEq, for her mild hypokalemia.  We are  going to increase her Risperdal to 0.5 mg orally b.i.d. and a 1 mg M-tab  nightly.  We will continue her other routine medications including her  Ambien at night.  Because of some mild pain that she is having from the  tooth extraction, we are going to give her ibuprofen 400 mg orally  t.i.d. for 3 days.  We discussed this with her.  She is in agreement  with the plan.  Estimated length of stay is 5 days.  We will consider  ECT and have discussed this with her.  We are giving her some additional  about this to consider.      Margaret A. Scott, N.P.      Geoffery Lyons, M.D.  Electronically Signed    MAS/MEDQ  D:  08/27/2006  T:  08/27/2006  Job:  [id number]

## 2010-06-14 NOTE — Consult Note (Signed)
[name], [name]               ACCOUNT NO.:  [id number]   MEDICAL RECORD NO.:  [id number]          PATIENT TYPE:  IPS   LOCATION:  0507                          FACILITY:  [facility]   PHYSICIAN:  Casimiro Needle L. Reynolds, M.D.DATE OF BIRTH:  February 24, 1970   DATE OF CONSULTATION:  10/04/2006  DATE OF DISCHARGE:                                 CONSULTATION   REFERRING PHYSICIAN:  Geoffery Lyons, MD   REASON FOR EVALUATION:  Headache.   HISTORY OF PRESENT ILLNESS:  This is an inpatient consultation  evaluation of this existing [facility] patient, a 40-  year-old woman, seen a few times in the office by Dr. Vickey Huger.  She has  a past medical history remarkable for chronic migraine, as well as a  history of bipolar disorder for which she has been on medication.  Over  the past couple of months, her bipolar disorder has been poorly  controlled; and she has actually been admitted to [facility] on 4 different occasions since the end of May.  She was most  recently admitted on August 27 with active suicidal ideation.  She has  been receiving electroconvulsive therapy at [facility] for the  past couple of months.  She states that since she has been on the ECT,  she has had a persistent headache which has been occurring pretty much  daily.  The headache is described as holocephalic, somewhat throbbing in  character and fairly severe.  It is associated with nausea but not  vomiting, photophobia and phonophobia.  She does not recall if she was  taking PRN medications prior to coming to the hospital.  Since she has  been in the hospital, she has had PRNs including Tylenol, Fioricet,  Midrin, and most recently Maxalt.  She says that medications will make  the headache a little bit better for a few hours but then it comes right  back just as severe.  She states that at the time she started her ECT  her Depakote was discontinued.  She has had an MRI of the brain which  is  unremarkable.  Neurologic consultation is requested.   PAST MEDICAL HISTORY:  As above.  She also has a known history of  hypertension.   FAMILY HISTORY/SOCIAL//REVIEW OF SYSTEM:  As outlined on the psychiatric  admission note from September 27, 2006 which is reviewed.   MEDICATIONS:  Prior to admission she was taking Ambien, Toprol, HCTZ,  potassium and Ativan.  Here in [facility], she continues to  receive HCTZ and Toprol.  She is also on Risperdal.  She was on lithium  until recently but this was discontinued.   PHYSICAL EXAMINATION:  VITAL SIGNS:  Temperature 97.9, respirations 18.  She does not have a documented blood pressure since admission, at which  time it was 143/99.  GENERAL:  This is a healthy-appearing woman, in no evident distress.  HEAD:  Cranium is normocephalic/atraumatic.  Oropharynx benign.  NECK:  Supple, without carotid or supraclavicular bruits.  HEART:  Regular rate and rhythm without murmurs.   NEUROLOGIC EXAMINATION:  MENTAL STATUS:  She is awake and alert.  Speech  is fluent and not dysarthric.  She does seem a little pressured and  animated, but not extremely so.  She has a little bit of trouble  performing short-term memory tasks but is oriented to time and place.  She is able to follow one- and two-step commands.  CRANIAL NERVES:  Pupils are equal and reactive.  Extraocular movements  full without nystagmus.  Visual fields are full to confrontation.  Hearing is intact to conversational speech.  Face, tongue and palate  move normally and symmetrically.  MOTOR:  Normal bulk and tone.  Normal strength in all tested extremity  muscles.  SENSATION:  Intact to light touch in all extremities.  COORDINATION:  Finger-to-nose and heel-to-shin are performed adequately.  GAIT:  She arises easily from the bed and her stance is normal.  She is  able to toe and tandem walk without difficulty.  REFLEXES:  2+ and symmetric.  Toes are downgoing  bilaterally.   LABORATORY REVIEW:  CBC, CMP, thyroid studies on admission are normal.  MRI of the brain performed September 28, 2006, is reviewed and I would  agree the study is normal.   IMPRESSION:  Chronic migraine, likely precipitated by electroconvulsive  therapy and probably also by the discontinuation of valproate.   RECOMMENDATIONS:  Will initiate around-the-clock Frova 2.5 mg b.i.d. for  5 days to try to break her chronic headache.  She would also benefit  from initiation of topiramate 25 mg b.i.d.  I warned her about side  effects, including paresthesias.  I discontinued all of her p.r.n.  medications except for Percocet, which should be used sparingly for  rescue.  Otherwise, she will be at risk for medication overuse headache.  I advised her to discontinue the use of oxygen at night, as this was not  helping.  She needs to follow up with Dr. Vickey Huger in the office for  followup and for consideration of a possible repeat sleep study, as she  has lost a lot of weight since her last sleep study which did  demonstrate mild sleep apnea.   Thank you for this consultation.      Michael L. Thad Ranger, M.D.  Electronically Signed     MLR/MEDQ  D:  10/04/2006  T:  10/05/2006  Job:  [id number]   cc:   Melvyn Novas, M.D.  Fax: [phone]

## 2010-06-14 NOTE — Discharge Summary (Signed)
[name], [name]               ACCOUNT NO.:  [id number]   MEDICAL RECORD NO.:  [id number]          PATIENT TYPE:  IPS   LOCATION:  0500                          FACILITY:  [facility]   PHYSICIAN:  Geoffery Lyons, M.D.      DATE OF BIRTH:  Nov 16, 1970   DATE OF ADMISSION:  06/29/2006  DATE OF DISCHARGE:  07/03/2006                               DISCHARGE SUMMARY   CHIEF COMPLAINT AND PRESENT ILLNESS:  This is one of multiple admissions  to [name] for this 40 year old single white female.  She reports she had been having increased suicidal thoughts over the  past 3 weeks prior to this admission with repeat mood cycling, thoughts  of stabbing her mother.  Endorsed intrusive thoughts.  She reports  having cut her wrist 3 weeks prior to this admission.  She reports she  went on vacation shortly after doing that.  She thought that her mood  and thoughts would improve, however, she was in [place] and [place] for a week, returned and just has not gotten any better.  Endorsed  having constant suicidal and  homicidal thoughts to a point that she  wanted to give up.   PAST MEDICAL HISTORY:  Numerous inpatient treatments.  Followed at [facility] outpatient department.  Last inpatient October 29  to December 01, 2005.   SUBSTANCE ABUSE HISTORY:  Denies active use of any substances.   MEDICAL HISTORY:  Hypertension.   MEDICATIONS:  Xanax 0.5, one to two daily as needed, Toprol XL 50 mg per  day, Zoloft 100 mg in the morning; Loxitane 25 two tablets at bedtime,  hydrochlorothiazide 25 mg per day, Lithium 600 in the morning and 300 in  the afternoon, Invega 3 mg per day.   PHYSICAL EXAMINATION:  Physical exam  performed failed to show any acute  findings.   LABORATORY WORK UP:  CBC:  White blood cells 10.1, hemoglobin 13.8.  Blood chemistry:  Sodium 138, potassium 3.3, glucose 98, SGOT 17, SGPT  16, total bilirubin 1.0, TSH 4.125.  Lithium level 0.60.   Drug screen  positive for benzodiazepines that has been prescribed.   MENTAL STATUS EXAM:  Reveals an alert cooperative female, appropriately  groomed and dressed.  Speech was in normal in rate, rhythm and tone.  Mood was depressed.  Affect depressed.  Thought process were clear,  rational and goal oriented.  Wanting to take her medications right.  Sense of hopelessness, helplessness, suicidal ruminations, no delusions.  No hallucinations.  Cognition well-preserved.   DIAGNOSES:  AXIS I: Bipolar disorder.  AXIS II: No diagnosis.  AXIS III:  Hypertension.  AXIS IV: Moderate.  AXIS V:  On admission 35, GAF in the last year 70.   COURSE IN THE HOSPITAL:  She was admitted, started on individual and  group psychotherapy.  She was placed on Loxitane 25 in the morning and  her lithium was increased as well as the Invega was increased to 6 mg  per day.  As already stated, she was admitted due to increase suicidal  thoughts for  the past 3 weeks. Rapid mood cycling, intrusive thoughts  about stabbing her mother, cutting her wrist 3 weeks ago.  Worse when  she came back from vacation and found out that things were not any  better.  Her mood was depressed.  Affect was depressed in an empty  agitated feeling that it was all pointless.  As we work with the  medications, seems to be starting to feel better.  June 3 she was  feeling better, did not have the urge to hurt herself or hurt anyone  else that she had before, said that she needed to get out of the  hospital because she really needed to work, was concerned that she would  lose her job if she was not out of the hospital.  She felt that the way  she was feeling at the time of this evaluation she could be safely at  home.  Endorsed no suicidal or homicidal ideas, no thoughts of hurting  herself.   DISCHARGE DIAGNOSES:  AXIS I: Bipolar disorder depressed.  AXIS II: No diagnosis.  AXIS III:  Hypertension.  AXIS IV: Moderate.  AXIS V:  On  discharge 55.   MEDICATIONS:  She will be discharged on Toprol XL 50 mg per day, Zoloft  100 mg per day, Loxitane 25 in the morning 50 at night,  hydrochlorothiazide 25 mg per day.  Lithium 300, two in the morning ,  one in the afternoon and two at night,  Xanax 0.5 one twice a day as  needed for anxiety.   FOLLOW UP:  Follow up on [facility] outpatient clinic.      Geoffery Lyons, M.D.  Electronically Signed     IL/MEDQ  D:  07/24/2006  T:  07/25/2006  Job:  [id number]

## 2010-06-14 NOTE — Discharge Summary (Signed)
[name], [name]               ACCOUNT NO.:  [id number]   MEDICAL RECORD NO.:  [id number]          PATIENT TYPE:  IPS   LOCATION:  0506                          FACILITY:  [facility]   PHYSICIAN:  Geoffery Lyons, M.D.      DATE OF BIRTH:  July 01, 1970   DATE OF ADMISSION:  07/20/2006  DATE OF DISCHARGE:  07/25/2006                               DISCHARGE SUMMARY   CHIEF COMPLAINT AND PRESENT ILLNESS:  This was one of multiple  admissions to [facility] for this 40 year old white  female who presented as a walk-in, reporting she was having suicidal  thoughts.  She could not contract for safety.  Was living with her  mother as a way to control her thoughts of wanting to hurt herself.  Had  ideas to crash her car, cut herself.  Endorsed stress from work.  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  Endorsed constant suicidal thoughts.  Endorsed  decreased sleep, decreased appetite.   PAST PSYCHIATRIC HISTORY:  Multiple admissions, being followed up  through the [facility].   ALCOHOL/DRUG HISTORY:  Denies active use of any substances.   MEDICAL HISTORY:  Hypertension.   MEDICATIONS:  Zoloft 100 mg per day, hydrochlorothiazide 25 mg per day,  Toprol XL 50 mg per day, Loxitane 50 mg at bedtime, Invega 6 mg per day,  lithium 300 mg, 2 in the morning, 1 at night, Xanax 0.5 mg twice a day  as needed, Ambien 10 at bedtime for sleep.   PHYSICAL EXAMINATION:  Performed and failed to show any acute findings.   LABORATORY DATA:  Not available in the chart.   MENTAL STATUS EXAM:  Alert, cooperative female.  Casually groomed,  dressed and nourished.  Anxious.  Speech is normal rate, tempo and  production.  Mood depressed.  Affect was constricted.  Thought processes  are logical, coherent and relevant.  Endorsed persistent depression,  sense of hopelessness and helplessness, suicidal thoughts,  although she  could contract for safety.  No hallucinations.  Cognition well-  preserved.   ADMISSION DIAGNOSES:  AXIS I:  Bipolar disorder.  AXIS II:  No diagnosis.  AXIS III:  Hypertension.  AXIS IV:  Moderate.  AXIS V:  GAF upon admission 35; highest GAF in the last year 65.   HOSPITAL COURSE:  She was admitted.  She was started in individual and  group psychotherapy.  She was maintained on her medication but she was  started on Depakote ER 500 mg per day.  Hinda Glatter was discontinued and she  was placed back on Risperdal which seemed to have worked for her better.  As already stated, she was discharged recently from [facility].  She was readmitted due to suicidal ideation, could  not contract for safety.  There was some psychomotor retardation.  Continued to worry about her suicidal thoughts.  She was back on the  Risperdal.  She continued to have a hard time, anticipating,  catastrophizing, what was going to happen  at work.  She apparently was  not going to be allowed to step down until she got a replacement.  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  Endorsed  depressed mood.  Evidence of depressed affect.  Sleep still was an  issue.  On July 25, 2006, things were getting better.  She endorsed that  she was feeling that the Depakote was helping.  Endorsed that the  suicidal thoughts had markedly decreased in intensity.  She was wanting  to leave the hospital.  She was expected to go back to work.  She felt  that she could give it a try.  She denied any active suicidal ideas and  was wanting to give this medication a try.   DISCHARGE DIAGNOSES:  AXIS I:  Bipolar disorder, depressed with prior  history of psychotic features.  AXIS II:  No diagnosis.  AXIS III:  Arterial hypertension.  AXIS IV:  Moderate.  AXIS V:  GAF upon discharge 50.   DISCHARGE MEDICATIONS:  1. Zoloft 150 mg per day.  2. Hydrochlorothiazide 25 mg per day.  3.  Toprol XL 50 mg per day.  4. Lithium carbonate 300 mg, 2 in the morning and 1 in the afternoon.  5. Depakote ER 500 mg, 2 at night.  6. Risperdal 1 mg, 1/2 twice a day and 1 at night.  7. Xanax 0.5 mg twice a day.  8. Ambien 10 mg at bedtime for sleep.   FOLLOWUPEarnestine Leys and Romero Belling.      Geoffery Lyons, M.D.  Electronically Signed     IL/MEDQ  D:  08/24/2006  T:  08/25/2006  Job:  [id number]

## 2010-06-14 NOTE — Discharge Summary (Signed)
Joyce, [name]               ACCOUNT NO.:  [id number]   MEDICAL RECORD NO.:  [id number]          PATIENT TYPE:  IPS   LOCATION:  0502                          FACILITY:  [facility]   PHYSICIAN:  [name], M.D.      DATE OF BIRTH:  Apr 21, 1970   DATE OF ADMISSION:  02/25/2007  DATE OF DISCHARGE:  03/07/2007                               DISCHARGE SUMMARY   CHIEF COMPLAINT AND PRESENTING ILLNESS:  This was one of multiple  admissions to [name] for this 40 year old white  female, single, voluntarily admitted.  Has had recent increasing  irritability, anxiety, conflict with her mom, depression, worrying about  a recent breast biopsy, feeling with increasingly more suicidal thoughts  as well as plans to cut.  Was feeling that the medications were not  working.   PAST PSYCHIATRIC HISTORY:  Multiple admissions, last time September  2008.  History of ECT this fall with poor results:   ALCOHOL AND DRUG HISTORY:  Denies active use of any substances.   MEDICAL HISTORY:  Migraine headaches.   MEDICATIONS:  1. Depakote ER 1000 mg at night.  2. Risperdal 1 mg three times a day.  3. Trazodone 50-150 mg at night.  4. Tegretol 200 mg twice a day.  5. Xanax 0.5 mg twice a day.  6. Hydrochlorothiazide 25 mg per day.  7. Toprol XL 100 mg daily.  8. Imitrex 6 mg cutaneous for headache.   Physical exam failed to show any acute findings.   LABORATORY WORKUP:  Tegretol level was 3.7.  Depakote level was 69.6.  white blood cells 7.2, hemoglobin 13.3.  Glucose 108, sodium 141,  potassium 3.9, SGOT 18, SGPT 20, total bilirubin 0.7.  TSH 2.347.   MENTAL STATUS EXAM:  She was a fully alert, cooperative female.  Mood  depressed.  Affect depressed.  Speech normal rate, tempo and production.  A sense of hopelessness and helplessness, suicidal ruminations, thoughts  of cutting deep.  No homicidal ideas, no hallucinations.  Cognition well-  preserved.   ADMISSION DIAGNOSES:  AXIS I:   Bipolar disorder, depressed.  AXIS II:  No diagnosis.  AXIS III:  1.  Migraine headache.  2.  Hypertension.  AXIS IV:  Moderate.  AXIS V:  Upon admission, 35; highest GAF in the last year, 65.   COURSE IN THE HOSPITAL:  She was admitted, started in individual and  group psychotherapy.  As already stated, a 40 year old female, single,  with a diagnosis of bipolar disorder, recently this was seen to be more  of a rapid cycling presentation.  When cycling, she experiences  increased suicidal thoughts, very overwhelmed with the way she was  feeling.  Has seen the counselor every week to twice a week.  Very  upset.  Endorsed wanting to cut as a way of dealing with her dysphoria  and mixed episode.  She was seen Friday on an outpatient basis.  Had a  decreased Tegretol level the day before this admission, still feeling  out of control, impulse with urges to cut, so then inpatient care was  recommended.  She endorsed mood lability and impulsivity, feeling very  overwhelmed, fear of losing control. suicidal ruminations.  Initial  assessment of bipolar mixed with rapid cycling.  We increased the  Tegretol and we reassessed the Risperdal, considered Abilify.  She  continued to endorsed mood swings, ruminating about suicide.  Endorsed  that a minimal event, any discussion with mother makes her think about  dying.  Was working towards changing that.  Discussed options and we  were trying to switch her from Risperdal to Abilify.  This Risperdal was  discontinued.  She was placed on Abilify 5 mg twice a day.  She was  pretty sedated by the Abilify, still endorsing mood swings.  She was  visited by the mother and she endorsed that she worked very hard not to  be triggered by her.  We continued to work with the Tegretol as well as  the Abilify.  We worked on Pharmacologist.  By January 30 the thoughts of  suicide were happening more often.  Still wanted to pursue the changes  of Abilify.  Willing to  increase the dose as long as it can control the  thoughts.  She continued to evidence mood swings, still with thoughts of  cutting, but she could contract.  Still somewhat sedated from the  Abilify.  Considerable stress with mother at home.  Mother apparently  put a lot of pressure regarding finances and also wanting to fix her.  [name] said she could not move out, cannot afford to live on her own.  February 1, had had a meltdown with her mother, telling the patient  with her consistent questioning of her progress and she yelled at her.  Endorsed that she responds by feeling suicidal.  Tegretol level was 7.4.  She continued to endorse mood swings on February 2, suicidal ideations,  especially when she experiences a down spell, what she calls meltdown.  Very unsure of herself, trying to isolate as a way of dealing with the  way she was feeling but endorsed that she understood isolation was not  the way to go.  We increased the Abilify February 3.  She had seen a  decrease in the mood swings, still not sleeping too well.  Mother  refused to come for a family session.  Still dealing with the mother's  perceived negative messages, that she is not good, fat __________.  Endorsed that she felt really down, depressed.  We worked on Medical sales representative.  We increased the Abilify.  February 4 she endorsed that the  mood swings were better.  Sleep was still an issue.  Liked to continue  the Abilify but she continued also to deal with the issues with her  mother.  February 5, she was in full contact with reality.  There were  no active suicidal or homicidal ideas, no hallucinations or delusions.  Endorsed that she felt better.  She was committed to make changes in her  lifestyle.  Wanted to be more actively involved.  She got a lot of  positive feedback from the peers and staff.  It was very encouraging  that she felt __________ she got at home was negative.   DISCHARGE DIAGNOSES:  AXIS I:  Bipolar  disorder, depressed, with mixed  episodes and rapid cycling.  AXIS II:  No diagnosis.  AXIS III:  1.  Migraine headaches.  2.  Hypertension.  AXIS IV:  Moderate.  AXIS V:  Upon discharge 16109.   Discharged on:  1. Depakote ER 500 mg two at bedtime.  2. Xanax 0.5 mg twice a day.  3. Hydrochlorothiazide 25 mg per day.  4. Toprol 100 mg per day.  5. Tegretol 200 mg three times a day.  6. Abilify 5 mg in the morning and 15 mg at bedtime.  7. Imitrex as needed.  8. Trazodone 50 mg one to three at night for sleep.   Follow-up at [facility] and [facility].      [name], M.D.  Electronically Signed     IL/MEDQ  D:  04/01/2007  T:  04/02/2007  Job:  [phone]

## 2010-06-14 NOTE — H&P (Signed)
[name], [name]               ACCOUNT NO.:  [id number]   MEDICAL RECORD NO.:  [id number]          PATIENT TYPE:  IPS   LOCATION:  0506                          FACILITY:  [facility]   PHYSICIAN:  Geoffery Lyons, M.D.      DATE OF BIRTH:  06/02/1970   DATE OF ADMISSION:  07/20/2006  DATE OF DISCHARGE:                       PSYCHIATRIC ADMISSION ASSESSMENT   This is a voluntary admission to the services of Dr. Geoffery Lyons.   IDENTIFYING INFORMATION:  This is a 40 year old single white female.  [name] presented as a walk-in yesterday, reporting that she was having  suicidal thoughts.  She felt like she could not contract for safety.  She is presently sleeping with her mother so that she will not cause any  harm to herself.  She met with Dr. Dub Mikes earlier who recommended that she  be inpatient.  She has had ideas to crash her car, to cut herself, etc.  Since leaving here, she left June 2 or June 3, she has requested to have  a change in her job responsibilities.  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  She realizes that this will help  decrease the amount of stress at work, and she was pleasantly surprised  to find out that financially it was a minimal impact.  It will only be a  dollar an hour change in pay rate, and she will be able to keep her  benefits.  She is in here today to help get her meds adjusted, to get  rid of the constant suicidal ideation, so hopefully she can return to  her employment on Tuesday.  She currently does have to take something to  sleep, and she reports a decreased appetite.  Her weight on admission is  246, and when she was here the other day her weight was the same at 245.   PAST PSYCHIATRIC HISTORY:  [name] was last admitted on May 30th, and she  was discharged on June 2nd or 3rd.   SOCIAL HISTORY:  Unchanged.  She has had 1 year of college.  She states  she was diagnosed as bipolar in May 2000.  She has never married.  She  has no  children.  She is currently employed by [employer].  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   ALCOHOL AND DRUG HISTORY:  She denies.   MEDICAL PROBLEMS:  She is known to have hypertension and is treated.  She is obese.   MEDICATIONS:  She was discharged on:  1. Zoloft 150 mg p.o. q. day.  2. Hydrochlorothiazide 25 mg p.o. q. day.  3. Toprol XL 50 mg p.o. q. day.  4. Loxitane 50 mg at h.s.  5. Invega 6 mg p.o. q. day.  6. Lithium 300 mg 2 in the morning and 1 at h.s.  7. Xanax 0.5 mg p.o. b.i.d. p.r.n.  8. Ambien 10 mg at h.s.   SHE HAS DRUG ALLERGIES TO PENICILLIN, MORPHINE, ULTRAM AND PREDNISONE.   PHYSICAL FINDINGS:  She is a well-developed, well-nourished white female  who appears her stated age.  She is in no acute distress.  She had no  new positives to her review of systems.  Her vital signs on admission  show she is 68-3/4 inches tall, she weighs 246 pounds, temperature is  96.5, blood pressure was 146/86 to 130/91, pulse is 73 to 83, and  respirations are 18.  Her lithium level is pending.  Her labs that are  available show that she has an elevated glucose at 133.   MENTAL STATUS EXAM:  Today, she is alert and oriented x3.  She is  casually groomed, dressed and nourished.  She does appear to be anxious.  Her speech, however, is not pressured.  Her mood is depressed and  anxious.  Her affect is congruent.  Thought processes are clear,  rational, and goal-oriented.  She reported her conversation with Dr.  Dub Mikes yesterday to add Depakote to her regimen to help with her mood  fluctuations and suicidal ideation.  Judgment and insight are intact.  Concentration and memory are intact.  Intelligence is at least average.  She is still having suicidal ideation.  She is not homicidal, and is not  having auditory/visual hallucinations.   DIAGNOSES:  AXIS I:  Bipolar disorder not otherwise specified.  AXIS II:   Deferred.  AXIS III:  Hypertension, obesity, elevated glucose.  AXIS IV:  Occupational.  AXIS V:  35.   PLAN:  Admit for safety and stabilization.  Dr. Katrinka Blazing and I saw her  together, and we discussed adjusting her medications.  Toward that end  Depakote ER 500 mg p.o. now, and then we will give her a standing dose  at h.s.  Her lithium level is not available yet, and we will check her  Depakote level on Monday.  She is hoping to be discharged to return to  work on Tuesday.      Mickie Leonarda Salon, P.A.-C.      Geoffery Lyons, M.D.  Electronically Signed    MD/MEDQ  D:  07/21/2006  T:  07/21/2006  Job:  [id number]

## 2010-06-15 ENCOUNTER — Ambulatory Visit: Payer: PRIVATE HEALTH INSURANCE | Admitting: Rehabilitation

## 2010-06-16 ENCOUNTER — Ambulatory Visit: Payer: PRIVATE HEALTH INSURANCE | Admitting: Physical Therapy

## 2010-06-17 NOTE — Discharge Summary (Signed)
NAMECELESTINE, PRIM               ACCOUNT NO.:  [id number]   MEDICAL RECORD NO.:  [id number]          PATIENT TYPE:  IPS   LOCATION:  0501                          FACILITY:  [facility]   PHYSICIAN:  Geoffery Lyons, M.D.      DATE OF BIRTH:  [birth year]   DATE OF ADMISSION:  11/24/2004  DATE OF DISCHARGE:  11/30/2004                                 DISCHARGE SUMMARY   CHIEF COMPLAINT AND PRESENT ILLNESS:  This was one of several admissions to  [facility] for this 40 year old white female, single,  voluntarily admitted.  Having daily suicidal thoughts with mood labile,  exacerbated by stressors at work.  Had missed some doses of Risperdal.  Broke glass at work, carrying piece in pocket to kill self.  Talked out of  it by coworker.  Endorsed two or three weeks of labile mood, restless sleep,  suicidal ideation, decreased concentration, rapid thoughts.   PAST PSYCHIATRIC HISTORY:  Third admission to [facility].  On  regular outpatient treatment.  Diagnosed bipolar.   ALCOHOL/DRUG HISTORY:  Denies active use of alcohol or any drugs.   MEDICAL HISTORY:  Migraines.   MEDICATIONS:  Lexapro, Risperdal 1 mg twice a day and 2 at night, lithium  600 mg twice a day, Imitrex, Toprol XL for migraine prophylaxis.   PHYSICAL EXAMINATION:  Performed and failed to show any acute findings.   LABORATORY DATA:  CBC with white blood cells 10.3, hemoglobin 13.5.  Blood  chemistry with glucose 105.  Liver enzymes with SGOT 17, SGPT 17, total  bilirubin 0.6, TSH 2.795.  Lithium 1.20.   MENTAL STATUS EXAM:  Fully alert, pleasant, cooperative female.  Speech  normal tone, hyperverbal.  Mood labile.  A sense of hopelessness,  helplessness, suicidal ruminations.  Plan to cut.  Hopelessness, incongruent  affect.  Cognition was well-preserved.   ADMISSION DIAGNOSES:  AXIS I:  Bipolar disorder.  AXIS II:  No diagnosis.  AXIS III:  Migraine headaches.  AXIS IV:  Moderate.  AXIS V:   GAF upon admission 28; highest GAF in the last year 70-75.   HOSPITAL COURSE:  She was admitted.  She was started in individual and group  psychotherapy.  We maintained her medications of Lexapro 20 mg per day,  Risperdal 1 mg in the morning, 1 in the afternoon and 2 at night, lithium  600 mg twice a day, Toprol XL 50 mg per day, hydrochlorothiazide 25 mg per  day, Xanax 0.5 mg every 4-6 hours as needed for anxiety and Imitrex.  Lithium level was 1.20.  We changed the Risperdal to M-Tab 1 mg twice a day  and 2 at night and then it was increased to 1 mg three times a day and 2 mg  at night.  She was given Ambien for sleep.  She required some Zyprexa Zydis  for acute agitation.  She endorsed that she was having a very hard time,  positive for suicidal ideation, could contract for safety while in the unit.  She was doing reasonably well and she went through some changes at work.  She felt that mother was rejecting of her, taking the rock out of her feet,  very upset with the situation.  Worked hard to get to the place she was and  felt she was losing control.  She __________ with suicidal ideation.  She  was able to contract for safety, hourly contract.  She found herself looking  for ways to hurt herself in the hospital but she was able to endorse that  she will go to staff.  Conflictive interaction with the mother that she was  able to address.  She was feeling very numb.  Anticipated the stress she was  going to face when she was discharged.  Endorsed some episodes with voices.  Was given Zyprexa successfully.  On October 31st, she was endorsing some  lightheadedness in the morning.  Still some thoughts of suicide but able to  process them.  Overall, she was better.  Able to come up with ways of  dealing with the suicidal thoughts.  Indeed, she has had chronic suicidal  ideation.  On November 1st, she endorsed that she was going to the [employer] and she was going to be praying there.   This was a positive thing for  her.  She was looking forward to it.  She felt as long as she had something  to look forward like this, if she stayed busy, she was going to be able to  recover faster.  She had endorsed decrease in the suicidal ruminations, had  worked on Pharmacologist and appropriate ways of dealing with the suicidal  ruminations.  Overall, mood had improved.  She felt confident in being  discharged as she felt she was going to be safe.   DISCHARGE DIAGNOSES:  AXIS I:  Bipolar disorder with some psychotic  features.  AXIS II:  No diagnosis.  AXIS III:  Migraine headaches.  AXIS IV:  Moderate.  AXIS V:  GAF upon discharge 50.   DISCHARGE MEDICATIONS:  1.  Lexapro 20 mg per day.  2.  Hydrochlorothiazide 25 mg per day.  3.  Lithium 300 mg, 2 twice a day.  4.  Risperdal M-Tab 1 mg three times a day and 2 at night.  5.  Toprol XL 50 mg per day.  6.  Zyprexa Zydis 5 mg every six hours as needed for acute anxiety and      agitation.  7.  Xanax 0.5 mg every 4-6 hours as needed.   FOLLOW UP:  Doreene Burke for counseling and Dr. Dub Mikes for medication  management.      Geoffery Lyons, M.D.  Electronically Signed     IL/MEDQ  D:  12/13/2004  T:  12/14/2004  Job:  [id number]

## 2010-06-17 NOTE — Discharge Summary (Signed)
[name], [name]               ACCOUNT NO.:  [id number]   MEDICAL RECORD NO.:  [id number]          PATIENT TYPE:  IPS   LOCATION:  0300                          FACILITY:  [facility]   PHYSICIAN:  Geoffery Lyons, M.D.      DATE OF BIRTH:  [birth year]   DATE OF ADMISSION:  08/27/2004  DATE OF DISCHARGE:  08/31/2004                                 DISCHARGE SUMMARY   CHIEF COMPLAINT AND PRESENT ILLNESS:  This was one of several admissions to  [facility] for this 40 year old single white female who  presented to the emergency room stating that she was bipolar, no insurance,  cannot afford her medication, has been taking half a dose.  Having suicidal  thoughts.  She felt she was losing control.  Feared that she might act on  the impulses.  Thoughts were repetitive.  Seeing no point in living.  Sponsored by [facility] as she was actively suicidal with a plan to  either cut her wrist, wreck her car or jump from a high place.   PAST PSYCHIATRIC HISTORY:  [name] twice in 2000, was  admitted in [facility] once in 2000, [place] in 2001.  Being followed up at the  [facility].   ALCOHOL/DRUG HISTORY:  Denies the active use of any substances.   PAST MEDICAL HISTORY:  Migraines, arterial hypertension.   MEDICATIONS:  Lexapro 10 mg per day, lithium 600 mg twice a day, Risperdal  0.5 mg in the morning and 1.5 mg at night, hydrochlorothiazide 12.5 mg in  the morning, Phenergan as needed, Celebrex in the morning for foot pain.   PHYSICAL EXAMINATION:  Performed and failed to show any acute findings.   LABORATORY DATA:  CBC with white blood cells 12.0.  Blood chemistry with  glucose 105.  Liver enzymes with SGOT 21, SGPT 20.  TSH 4.330.  Lithium  1.00.   MENTAL STATUS EXAM:  Alert, cooperative female.  Casually dressed.  Speech  was normal in rate, tempo and production.  Mood depressed, anxious.  Affect  was congruent.  Thought processes were clear,  rational and goal-oriented.  Ruminating about her insurance situation.  No delusions.  No hallucinations.  Ruminating about suicide.  Cognition was well-preserved.   ADMISSION DIAGNOSES:  AXIS I:  Bipolar disorder.  AXIS II:  No diagnosis.  AXIS III:  Migraines.  AXIS IV:  Moderate.  AXIS V:  GAF upon admission 30; highest GAF in the last year 75.   HOSPITAL COURSE:  She was admitted.  She was started in individual and group  psychotherapy.  She was placed back on lithium 600 mg twice a day, Risperdal  0.5 mg in the morning, 0.25 mg at 2 p.m. and 2 mg at night,  hydrochlorothiazide 12.5 mg per day, Indomethacin 25 mg every six hours as  needed for pain, Midrin for headache, Phenergan 25 mg every four hours as  needed, Imitrex 6 mg injection for migraine.  Endorsed increased stress  after she got a part-time job that became full-time and she was made  Control and instrumentation engineer.  Increased responsibilities at the same time she had to  decrease her medications as she did not have insurance and she could not  afford them, so she started decreasing the dosage of medication on her own.  She started decreasing the dosage of the medication as she could not afford  to fill the whole prescription.  Became more agitated, racing thoughts,  willing to pursue the medication further as they were working.  While in the  unit, had episode where she felt that she could hurt someone, a lot of  agitation.  Continued to endorse the racing thoughts, increased ruminations  about suicide.  She was given extra Risperdal which was effective.  Risperdal was increased to 0.5 mg twice a day and 2 mg at night.  Lithium  level, as already stated, was 1.0.  Sleep was still an issue.  Required 2  Ambien and she was given trazodone for sleep as needed because she had a  hard time with sleep.  On August 2nd, she was much better.  Marked decrease  in the ruminations.  Endorsed that she could handle them the way she was  feeling.  She endorsed she was going to be better out of the hospital.  She  was going to go back to work.  Endorsed that she was going to be better if  she was able to resume her duties and be busy, so she could be distracted.  Upon discharge, in full contact with reality.  No suicidal or homicidal  ideation.  No hallucinations.  No delusions.   DISCHARGE DIAGNOSES:  AXIS I:  Bipolar disorder, depressed versus mixed  state.  AXIS II:  No diagnosis.  AXIS III:  Migraines, arterial hypertension.  AXIS IV:  Moderate.  AXIS V:  GAF upon discharge 50.   DISCHARGE MEDICATIONS:  1.  Lithium carbonate 300 mg, 2 twice a day.  2.  Hydrochlorothiazide 12.5 mg per day.  3.  Lamictal 25 mg per day.  4.  Risperdal 0.5 mg twice a day and 2 mg at night.  5.  Trazodone 100 mg, 1-1/2 at night as needed for sleep.   FOLLOW UP:  Dr. Dub Mikes at [facility].      Geoffery Lyons, M.D.  Electronically Signed     IL/MEDQ  D:  09/22/2004  T:  09/23/2004  Job:  [id number]

## 2010-06-17 NOTE — Procedures (Signed)
NAME:  [name], [name]               ACCOUNT NO.:  [id number]   MEDICAL RECORD NO.:  [id number]          PATIENT TYPE:  OUT   LOCATION:  SLEEP CENTER                 FACILITY:  [facility]   PHYSICIAN:  Melvyn Novas, M.D.  DATE OF BIRTH:  1970-04-10   DATE OF STUDY:  02/06/2006                            NOCTURNAL POLYSOMNOGRAM   HISTORY OF PRESENT ILLNESS:  This 40 year old overweight, Caucasian,  right handed female is a patient of [facility], who  presents with morning headaches, dry mouth, and disturbed sleep. The  patient is on the following medications:  Toprol XL 75 mg, Lithium,  hydrochlorothiazide, Risperdal, Loxitane, Xanax, Tylenol, Ambien p.r.n.,  Zoloft and Imitrex p.r.n. The patient endorsed the Epworth Sleepiness  Scale at 11 point, measured a neck circumference of 16 inches, and  endorsed the Beck Depression Inventory at 18 points.   The patient was able to initiate sleep after a prolonged latency of 70.5  minutes. The sleep efficiency for the recorded night was 85% and to  borderline. The patient had 2% of deep stage 1, 84% of sleep stage 2,  only 8% of the slow wave sleep stage 3 and 4, also known as delta sleep,  and only 6% REM sleep. Besides the prolonged sleep latency, there was  also a very extremely prolonged REM sleep latency of 303 minutes.   Respiratory review showed an apnea/hypopnea index of 7.5, associated  with only mild desaturations to a nadir of 89%. The absolute number of  hypopnea's and apnea's was as follows:  26 hypopnea's and 9 apnea's were  measured. The longest respiratory event lasted 48.9 seconds. The average  length was 23.8 seconds.   EMG recording shows infrequent periodic limb movements in sleep with an  arousal index of 0.6 per hour of sleep.   Snoring was recorded as very loud and audible and was heard in different  sleep positions.   EKG data showed no obvious arrhythmias. There were no frequent PVC's and  no  tachy or bradycardiac responses to prolonged hypopnea's or apnea's  were noted.   CONCLUSION:  This patient present with rather mild sleep apnea and no  significant clinical desaturations. However, the borderline sleep  efficiency and the prolonged latency are abnormal, as well as the  prolonged REM sleep latency. The latter can be explained by the  patient's medications, which will suppress REM sleep as well as prolong  it latency. The EEG showed no abnormalities and there as no evidence on  video recording that the patient suffered from nocturnal hallucinations  or vivid dreams in the form of parasomnia's.   RECOMMENDATIONS:  The respiratory disturbance index is rather low to use  CPAP as a prime treatment. It seems that it would be beneficial to firs  endorse weight loss, a moderate exercise program, and to encourage the  patient not to sleep in supine position. If these steps fail, I would  recommend a CPAP titration next.   I have called the patient at home and left the results of her sleep  study with her.      Melvyn Novas, M.D.  Diplomate, Biomedical engineer of Sleep  Medicine  Electronically Signed     CD/MEDQ  D:  02/26/2006 17:30:07  T:  02/26/2006 21:03:52  Job:  [id number]

## 2010-06-17 NOTE — H&P (Signed)
[name], [name]               ACCOUNT NO.:  [id number]   MEDICAL RECORD NO.:  [id number]          PATIENT TYPE:  IPS   LOCATION:  0300                          [facility]ILITY[facility]   PHYSICIAN:  Jeanice Lim, M.D. DATE OF BIRTH:  27-Nov-1970   DATE OF ADMISSION:  08/27/2004  DATE OF DISCHARGE:                         PSYCHIATRIC ADMISSION ASSESSMENT   This is an involuntary admission to the services of Dr. Aleatha Borer.   IDENTIFYING INFORMATION:  This is a 40 year old single white female  apparently she presented to the emergency room stating that she is bipolar,  has no insurance, cannot afford her medications and has been taking a half-  dose.  She was having suicidal ideations.  She felt that she was losing  control, fearful that she might act on her impulses.  Her thoughts were  repetitive, sees no point in living.  She was sponsored by [facility]  as she was actively suicidal with a plan to either cut her wrist, wreck her  car or jump from a high place.  This patient is well known to me from prior  employment at the [employer].  This is her typical behavior pattern.   PAST PSYCHIATRIC HISTORY:  [name] where she was  admitted twice in 2000.  [place] she was admitted once in 2000.  She had  one admission to [facility] in 2001.  She states that she has been seeing Dr. Dub Mikes  on an outpatient basis since that time and has been able to maintain her  mood and some degree of employment through Dr. Dub Mikes.  She also had been at  [facility] with a therapist doing DBT.  The therapist unfortunately graduated.  At  any rate the clinic told her that there was no further therapy to be offered  for her.  If she needed long-term therapy she would have to find a private  therapist.   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  She denies any knowledge of what  precipitated this other than the fact that  Dr. Andrey Campanile indicated he was going  to be downsizing.  She secured employment this past January.  She has been  working at an employment called [employer] and recently was promoted to  being International aid/development worker.  Apparently in the past couple of weeks with her  new job title she has taken on more than she needed to.  She started having  issues with sleep, nausea, diarrhea and this culminated in her feeling  suicidal again yesterday.   FAMILY HISTORY:  Her father was bipolar.   ALCOHOL AND DRUG HISTORY:  She denies.  Her urine drug screen was negative  as was her alcohol level.   PRIMARY CARE Khalila Buechner:  She just began care the day before admission with  Dr. Reuben Likes at [facility] [address].  She has a long history for  migraines, and she sought care for foot pain as well as nausea and diarrhea  with Dr. Reuben Likes.  MEDICATIONS:  She states that she has been prescribed  1.  Lexapro 10 mg q.a.m.  2.  Lithium 600 mg q.a.m. and h.s.  3.  Risperdal 0.5 mg in the a.m. and 1.5 mg at h.s.  4.  Hydrochlorothiazide 12.5 mg q.a.m. just started by Dr. Katrinka Blazing.  5.  Phenergan p.r.n. for nausea and vomiting.  6.  Celebrex q.a.m. for her foot pain.   DRUG ALLERGIES:  PENICILLIN from which she gets hives.  MORPHINE gives  nausea.   PHYSICAL EXAMINATION:  She has gained a lot of weight since the last time I  saw her, otherwise, it is as per her evaluation in the ER.   MENTAL STATUS EXAM:  Appearance and behavior:  She needs to wash her hair.  She is a little less groomed that her normal.  Her dress is casual, and she  appears to be adequately nourished.  Her speech is not pressured.  Her mood  is depressed and anxious with some degree of intensity.  Her affect is  congruent.  Her thought processes are clear, rational and goal-oriented.  She is very repetitive about her insurance situation.  Judgment and insight  are fair.  Concentration and memory are intact.  Intelligence is at least   average.  She is still at least somewhat suicidal.  She is not homicidal.  She denies auditory or visual hallucinations.  Basically she states that she  just does not want to live like this.   ADMISSION DIAGNOSES:  AXIS I:  Borderline personality disorder with bipolar  features.  AXIS III:  Migraines.  Recent nausea and vomiting, rule out lithium  toxicity.  Lithium level is pending.  AXIS IV:  Problems with primary support group and increased stress at work.  AXIS V:  30.   PLAN:  Adjust her medications as indicated to help ensure that she can  obtain her medications once discharged.  Counseled the patient that she  should have called Dr. Runell Gess office to indicate that her mood was changing  and she needed an increase in her medications either through prescription or  through proviso of samples.  Again, this is her normal course of behavior.  There is nothing new with this.       MD/MEDQ  D:  08/28/2004  T:  08/28/2004  Job:  [id number]

## 2010-06-17 NOTE — Discharge Summary (Signed)
NAMELANDYN, LORINCZ               ACCOUNT NO.:  [id number]   MEDICAL RECORD NO.:  [id number]          PATIENT TYPE:  IPS   LOCATION:  0506                          [facility]ILITY[facility]   PHYSICIAN:  Geoffery Lyons, M.D.      DATE OF BIRTH:  [birth year]   DATE OF ADMISSION:  11/27/2005  DATE OF DISCHARGE:  12/01/2005                               DISCHARGE SUMMARY   ADMISSION DIAGNOSES:   AXIS I:  Bipolar disorder, mixed state.   AXIS II:  No diagnosis.   AXIS III:  1. Headaches, migraine.  2. Diarrhea.   AXIS IV:  Moderate.   AXIS V:  Upon admission 35; her GAF in the last year is 70.   DISCHARGE DIAGNOSES:   AXIS I:  Bipolar disorder, mixed, with psychotic features.   AXIS II:  No diagnosis.   AXIS III:  1. Headaches, migraine type.  2. Diarrhea, chronic.  Rule out secondary to lithium.   AXIS IV:  Moderate.   AXIS V:  Upon discharge 55-60.   CHIEF COMPLAINT AND PRESENT ILLNESS:  This was one of several admissions  to [facility] for this 40 year old, white, single  female, voluntarily admitted requesting help for increased agitation  with some impulsive, intrusive thoughts of cutting herself, trouble  being safe using a box cutter at work, just wanted it to be over.  Strong suicidal thoughts, sleep decreasing to three to four hours at  night for the last three weeks, headache, diarrhea.   PAST PSYCHIATRIC HISTORY:  Multiple admissions to [facility], actually in counseling at [facility] [place].  Medication management at [facility].   ALCOHOL AND DRUG HISTORY:  No active use of any substances.   MEDICAL HISTORY:  Migraine headaches, growing diarrhea.   MEDICATIONS:  1. Imitrex.  2. Lithium 600 twice a day.  3. Risperdal M-tab twice a day.  4. Zoloft 50 mg in the morning.  5. Hydrochlorothiazide 25 mg in the morning.  6. Toprol-XL 50 mg in the morning.  7. Xanax 1 mg at night as  needed.   MENTAL STATUS EXAM:  An alert, cooperative female, somewhat __________  with some pressure of speech.  Mood anxious, irritable, really not with  agitation.  Affect somber lability.  Thought processes are coherent, and  relevant, positive for intrusive suicidal thoughts, fear of not being  able to control them and to end up hurting herself.  No active  hallucinations.  Cognition well preserved.   COURSE IN HOSPITAL:  She was admitted.  She was started in individual  and group psychotherapy.  As already stated, diagnosed bipolar with  history of psychotic features.  Endorsed that she has continued to  experience the suicidal ruminations, got to a point where she felt she  was not going to be able to be safe.  Endorsed mood fluctuation, is  working, has a much better job, but this is stressful.  Endorses some  stress coming from family dynamics.  Endorsed that the last time she  told the mother that she  was having suicidal thoughts, the mother told  her that she should go ahead and do it.  Mother endorsed a conflict with  an aunt and the estate of the grandmother.  By October 30th, endorsed  that she was hearing voices, but would not share what the voices were  telling her.  She endorsed that she was having a hard time dealing with  them.  The night before, endorsed that the voices were tormenting her.  She required Zyprexa and Ativan, __________ was telling her different  things, endorsed that she was struggling with the voices that wanted her  to cut.  Medications were changed to Vistaril, Zyprexa, Seroquel,  Abilify, and Geodon.  We went ahead and added the Loxitane to the  Risperdal, and we tried to switch the Zoloft to the Cymbalta and felt  that the medication antidepressant was not working.  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  We continued to work on the medications.   LABORATORY DATA:  Laboratory workup obtained when she was an inpatient:  CBC:  White blood cells 12.5, hemoglobin 13.4.  Liver enzymes:  SGOT 17,  SGPT 18, total bilirubin 0.6, TSH 2.580, lithium 1.45; October 29th,  adjusted by 300, it was 0.81.  The thought was that the diarrhea was  secondary to the lithium.  Drug screen was negative for any evidence of  substance abuse.   We worked on Pharmacologist, cognitive behavior, therapy principals, and  overall she got better.  She was able to tolerate her medications well,  and on November 2nd, she endorsed she was feeling better, there were no  suicidal or homicidal ideas, no hallucinations, no delusions.  Tolerated  the Risperdal and Loxitane well, no side effects.  __________ medication  and wanting to still be discharged to go to work and that she was going  to be busy and she would not have any time to think about hurting  herself.   DISCHARGE MEDICATIONS:  1. Toprol-XL 50 mg per day.  2. Risperdal 1 mg twice a day.  3. Lithium carbonate 300, two in the morning and one in the afternoon.  4. Cymbalta 30 mg twice a day.  5. Loxitane 25 mg two at bedtime.  6. Xanax 0.5 twice a day as needed.  7. Ambien 10 at bedtime.   Follow up at [facility].      Geoffery Lyons, M.D.  Electronically Signed     IL/MEDQ  D:  12/29/2005  T:  12/29/2005  Job:  [id number]

## 2010-06-17 NOTE — Discharge Summary (Signed)
[name], [name]               ACCOUNT NO.:  [id number]   MEDICAL RECORD NO.:  [id number]          PATIENT TYPE:  IPS   LOCATION:  0306                          FACILITY:  [facility]   PHYSICIAN:  Geoffery Lyons, M.D.      DATE OF BIRTH:  [birth year]   DATE OF ADMISSION:  12/28/2004  DATE OF DISCHARGE:  01/02/2005                                 DISCHARGE SUMMARY   CHIEF COMPLAINT AND PRESENT ILLNESS:  This was the first admission to [facility] for this 40 year old single female voluntarily  admitted.  Endorsed increased anxiety, worsening of the voices as well as  increased in the ruminations to kill herself.   PAST PSYCHIATRIC HISTORY:  This is the third admission since October.  Irregularly being followed up on an outpatient basis.  Initially through  [facility] and then through outpatient psychiatrist.   ALCOHOL/DRUG HISTORY:  Denies the active use of any substances.   MEDICAL HISTORY:  Arterial hypertension, migraine headaches.   MEDICATIONS:  Lexapro 30 mg per day, Toprol XL 50 mg per day,  hydrochlorothiazide 25 mg per day, Risperdal M-Tab 1 mg three times a day  and 2 mg at night, lithium 300 mg in the morning, 600 mg at night and Xanax  0.5 mg three times a day as needed.   PHYSICAL EXAMINATION:  Performed and failed to show any acute findings.   LABORATORY DATA:  Laboratories were not repeated as she was recently  inpatient.   MENTAL STATUS EXAM:  Alert, cooperative female.  Speech normal rate, tempo  and production.  Mood depressed, anxious.  Affect anxious.  Thought process  logical, coherent and relevant.  No delusions.  Unable to contract for  safety in the community.  No hallucinations.  Cognition was well-preserved.   ADMISSION DIAGNOSES:  AXIS I:  Bipolar disorder with psychotic features.  AXIS II:  No diagnosis.  AXIS III:  Arterial hypertension, migraine headaches.  AXIS IV:  Moderate.  AXIS V:  GAF upon admission 35; highest GAF  in the last year 75.   HOSPITAL COURSE:  She was admitted.  She was started in individual and group  psychotherapy.  She was started on Abilify and the Risperdal was decreased  as she tolerated the Abilify.  She was also switched to Zoloft, that was  increased to 50 mg per day.  Abilify was eventually increased to 15 mg.  Endorsed she was very afraid to act out the thoughts of cutting herself.  Before she came into the unit, felt the medications were not helping,  getting more depressed, ideas to hurt herself.  Some auditory  hallucinations.  As we switched from Risperdal to Abilify and Zoloft, it  seemed that she started tolerating the medication well.  Anxious about when  she left the hospital and went back to work.  Sleep was an issue that was  addressed.  Some anxiety after she started the Abilify but it seemed to  settle down.  For the next couple of days, she continued to stabilize.  On  December 4th, endorsed the ruminations  __________ were not present.  Her  mood was better.  Her affect was objectively better.  She felt she was  better.  Objectively, she was.  She was willing and motivated to pursue this  combination of medications.  Feeling that she could make it this time  around.  As she was stable enough, we went ahead and discharged to  outpatient follow-up.   DISCHARGE DIAGNOSES:  AXIS I:  Bipolar disorder with psychotic features.  AXIS II:  No diagnosis.  AXIS III:  Arterial hypertension, migraine headaches.  AXIS IV:  Moderate.  AXIS V:  GAF upon discharge 60.   DISCHARGE MEDICATIONS:  1.  Xanax 0.5 mg three times a day as needed for anxiety.  2.  Toprol XL 50 mg per day.  3.  Hydrochlorothiazide 25 mg per day.  4.  Lithium 300 mg, 1 in the morning and 2 at night.  5.  Abilify 50 mg at night.  6.  Zoloft 50 mg per day.  7.  Ambien 10 mg at night for sleep.   FOLLOW UP:  [name] with Dr. Dub Mikes.      Geoffery Lyons, M.D.  Electronically  Signed     IL/MEDQ  D:  01/09/2005  T:  01/10/2005  Job:  [id number]

## 2010-06-17 NOTE — Discharge Summary (Signed)
[name], [name]               ACCOUNT NO.:  [id number]   MEDICAL RECORD NO.:  [id number]          PATIENT TYPE:  IPS   LOCATION:  0506                          FACILITY:  [facility]   PHYSICIAN:  Geoffery Lyons, M.D.      DATE OF BIRTH:  1970-01-31   DATE OF ADMISSION:  12/06/2004  DATE OF DISCHARGE:  12/12/2004                                 DISCHARGE SUMMARY   CHIEF COMPLAINT/HISTORY OF PRESENT ILLNESS:  This was one of several  admission to [facility] for this 40 year old female  diagnosed bipolar with comorbid personality disorder, NOS, borderline traits  who went back to work.  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  She was afraid she could not control  herself, and she requested help.   PAST PSYCHIATRIC HISTORY:  Initially seen at [facility], then  outpatient therapies, several trials of medications.   ALCOHOL AND DRUG HISTORY:  No history of active use of any substances.   MEDICAL HISTORY:  Hypertension.   MEDICATIONS:  1.  Risperdal.  2.  Lithium.  3.  Lexapro.  4.  Zyprexa p.r.n.   PHYSICAL EXAMINATION:  Performed and failed to show any acute findings.   LABORATORY WORKUP:  None performed as she was recently discharged from this  unit.   MENTAL STATUS EXAM:  Reveals an alert, cooperative female.  Speech normal  rate, tempo and production.  Mood, anxious with underlying depression.  Affect, anxious.  Thought processes logical, coherent and relevant, suicidal  ruminations.  Obsessive in nature.  Some suicidal ideation, looking for ways  to hurt herself, no delusions, no hallucination.  Cognition well preserved.   ADMISSION DIAGNOSES:  AXIS I:  Bipolar disorder.  AXIS II:   Personality disorder, not otherwise specified.  AXIS III:  Hypertension.  AXIS IV:  Moderate.  AXIS V:  Upon admission 35, highest global assessment of function in the  last year 70.   COURSE IN HOSPITAL:  She was admitted.  She was started in individual and  group psychotherapy.  She was maintained on her lithium 600 mg twice a day,  Lexapro 20 mg per day, Xanax 0.5 every 4 hours as needed for anxiety,  hydrochlorothiazide 25 mg daily, Toprol XL 50 mg per day.  Initially on  Risperdal, she was switched to Zyprexa as the Risperdal was not holding her.  She was placed on Zyprexa 2.5 twice a day and 10 at night.  Eventually  Zyprexa was increased to 5 mg twice a day and 10 at night.  She required  some Imitrex for migraine headache.  Sleep was an issue.  She continued upon  initial admission to have negative thoughts, suicidal ruminations but able  to contract for safety.  Feeling tired, exhausted, would like to rest.  She  was willing to continue the Zyprexa  and allow some time for her to get used  the sedation.  She continued to endorse the ruminations.  She was able to go  to group and start talking about things.  She endorsed at one time she was  looking for ways to hurt herself.  Sleep was an issue.  She felt tired in  the morning.  As we continued to optimize treatment with the medications,  her thinking started to improve, made her worry about getting out of the  hospital and going back to face the outside world.  We worked on Materials engineer.  On December 12, 2004, she endorsed that she was much better.  Could  see decrease in suicidal ruminations, could see the Zyprexa working better,  less sedating.  Her affect was brighter, broader, did endorse decrease in  the suicidal ruminations.  She could definitely contract for safety.  She  was going to be followed very closely on an outpatient basis; therefore, we  went ahead and discharged the patient.   DISCHARGE DIAGNOSES:  AXIS I:   Bipolar disorder.  AXIS II:  Personality disorder, not otherwise specified.  AXIS III:  Hypertension.  AXIS IV:  Moderate.  AXIS V:  Upon discharge global assessment of function 55-60.   DISCHARGE MEDICATIONS:  1.  Lithium 300 mg 2 twice a day.  2.  Lexapro 20 mg per day.  3.  Toprol XL 50 mg per day.  4.  Hydrochlorothiazide 25 mg per day.  5.  Zyprexa Zydis 5 in the morning and 15 at night, then switch to 20 at      bedtime.   FOLLOW UP:  [facility] and Dr. Dub Mikes on an outpatient basis.      Geoffery Lyons, M.D.  Electronically Signed     IL/MEDQ  D:  12/20/2004  T:  12/21/2004  Job:  [id number]

## 2010-06-17 NOTE — Discharge Summary (Signed)
[name], [name]               ACCOUNT NO.:  [id number]   MEDICAL RECORD NO.:  [id number]          PATIENT TYPE:  IPS   LOCATION:  0506                          FACILITY:  [facility]   PHYSICIAN:  Geoffery Lyons, M.D.      DATE OF BIRTH:  08-05-1970   DATE OF ADMISSION:  07/28/2006  DATE OF DISCHARGE:  08/03/2006                               DISCHARGE SUMMARY   CHIEF COMPLAINT:  This was one of multiple admissions to [name] for this 40 year old single white female presented as a  walk-in, could not contract for safety, having constant suicidal  thoughts, felt that she had left the hospital too soon, slept with her  mother the night before to avoid hurting herself.  Stated that her  employer did not want to take disability and returned to work a couple  of days prior to this admission.  Was unable to function, constantly  anxious and preoccupied with suicidal ideas.  She had a plan to cut her  wrist.  Apparently when she left on June 25 the idea was that she would  go to a lower level of responsibility at [facility].  Said that did not  happen.  She apparently had been threatened with termination.   PAST PSYCHIATRIC HISTORY:  Two prior admissions on the same month, May  the 30th to June 3 and June 20 to June 25.   ALCOHOL AND DRUG HISTORY:  No active use of any substances.   MEDICAL HISTORY:  Hypertension.   PHYSICAL EXAMINATION:  Performed failed to show any acute findings.   LABORATORY WORKUP:  Potassium 3.4, glucose 102.   MEDICATIONS:  1. Zoloft 150 mg per day  2. Hydrochlorothiazide 25 mg per day  3. Toprol XL 50 mg per day  4. Lithium 600 mg in the morning 300 in the afternoon  5,  Depakote 500 mg two at night  1. Risperdal 1 mg one-half twice a day and one at night  2. Xanax 0.5 twice a day as needed  3. Ambien 10 at bedtime for sleep.   PHYSICAL EXAMINATION:  Performed failed to show any acute findings.   MENTAL STATUS EXAM:  Reveals alert  cooperative female. She is not as  spontaneous.  Mood is depressed and anxious.  Affect is constricted.  Thought processes are clear, rational and goal oriented.  Feeling very  overwhelmed.  Endorsed suicidal ideas with suicidal plans although she  could contract while in the hospital.  No active delusions.  No  hallucinations.  Cognition well-preserved.   ADMITTING DIAGNOSES:  AXIS I: Bipolar disorder depressed.  AXIS II: No diagnosis.  AXIS III:  Hypertension.  AXIS IV: Moderate.  AXIS V:  GAF  global assessment of functioning on admission 35, highest  GAF in the last year 70.   COURSE IN THE HOSPITAL:  She was admitted.  She was started in  individual and group psychotherapy.  She was initially maintained on her  medications. Zoloft was increased to 200 mg per day.  Lithium level was  0.59 so we increased the lithium to  600 mg in the morning, 600 mg at  night and Risperdal was increased to 0.5 twice a day and Risperdal 1.5  at bedtime.  Recognized that she was not ready to leave the hospital  when she did last time, felt pressure to go back to work, but she said  she was not ready.  Admitted to increased thoughts of hurting herself,  thoughts of cutting deeper.  Had been sleeping with her mother and  stated that she could not trust herself.  Endorsed mood fluctuations,  thoughts of hurting herself, feeling very overwhelmed.  We maintained  the changes already described.  Lithium was increased as well as the  Zoloft and the Risperdal.  July 1 was still having a very hard time,  still having suicidal thoughts but could contract for safety.  Having a  hard time, catastrophizing, wanting to feel better.  Somewhat sedated  after the Risperdal was increased, but willing to the medication a good  try.  Concerned about the situation at work, feeling that she could not  handle anything.  As we worked with adjusting the medications we also  worked on Pharmacologist.  Hard time even thinking  that she was going to  be able to communicate with her supervisor, catastrophizing, very upset,  tearful.  Sleeping more, did admit to avoid dealing with what is going  on.  After we increased the lithium in July 30, lithium  level was 1.12.  She also endorsed that she is starting to see a decrease in her suicidal  ruminations.  At that particular time she also seemed to be more at ease  and her affect was brighter.  Anxious about talking to her supervisors.  July 4 she was in full contact with reality.  Objectively better.  No  suicidal or homicidal ideas, no hallucinations or delusions was wanting  to pursue further outpatient treatment.  Endorsed no thoughts of hurting  herself, so we went ahead and discharged to outpatient follow-up.   DISCHARGE DIAGNOSES:  AXIS I: Bipolar disorder depressed.  AXIS II: No diagnosis.  AXIS III: Arterial hypertension.  AXIS IV: Moderate.  AXIS V:  GAF on discharge 55-60.   DISCHARGE MEDICATIONS:  1. Depakote ER 500 two at bedtime  2. Xanax 0.5 twice a day as needed  3. Ambien 10 at bedtime for sleep  4. Hydrochlorothiazide 25 mg per day  5. Toprol XL 50 mg per day  6. Lithium carbonate 300 two in the morning two at bedtime  7. Zoloft 200 mg per day  8. Risperdal M-tab 0.5 twice a day and M-tab 1 mg at bedtime.   FOLLOW UP:  [name] outpatient clinic and [facility] for counseling.      Geoffery Lyons, M.D.  Electronically Signed     IL/MEDQ  D:  09/03/2006  T:  09/03/2006  Job:  [id number]

## 2010-06-20 ENCOUNTER — Encounter: Payer: Self-pay | Admitting: Family Medicine

## 2010-06-20 ENCOUNTER — Inpatient Hospital Stay (INDEPENDENT_AMBULATORY_CARE_PROVIDER_SITE_OTHER)
Admission: RE | Admit: 2010-06-20 | Discharge: 2010-06-20 | Disposition: A | Discharge status: Home / Self Care | Payer: PRIVATE HEALTH INSURANCE | Source: Ambulatory Visit | Attending: Emergency Medicine | Admitting: Emergency Medicine

## 2010-06-20 ENCOUNTER — Ambulatory Visit (INDEPENDENT_AMBULATORY_CARE_PROVIDER_SITE_OTHER): Payer: PRIVATE HEALTH INSURANCE | Admitting: Family Medicine

## 2010-06-20 VITALS — BP 121/88 | HR 73

## 2010-06-20 DIAGNOSIS — M25519 Pain in unspecified shoulder: Secondary | ICD-10-CM

## 2010-06-20 DIAGNOSIS — M25511 Pain in right shoulder: Secondary | ICD-10-CM

## 2010-06-20 DIAGNOSIS — R51 Headache: Secondary | ICD-10-CM

## 2010-06-20 DIAGNOSIS — G250 Essential tremor: Secondary | ICD-10-CM

## 2010-06-20 DIAGNOSIS — M25539 Pain in unspecified wrist: Secondary | ICD-10-CM

## 2010-06-21 ENCOUNTER — Ambulatory Visit: Payer: PRIVATE HEALTH INSURANCE | Admitting: Physical Therapy

## 2010-06-21 DIAGNOSIS — M25531 Pain in right wrist: Secondary | ICD-10-CM | POA: Insufficient documentation

## 2010-06-21 DIAGNOSIS — G250 Essential tremor: Secondary | ICD-10-CM | POA: Insufficient documentation

## 2010-06-21 NOTE — Progress Notes (Signed)
  Subjective:    Patient ID: [name], female    DOB: 09-Apr-1970, 40 y.o.   MRN: [id number]  HPI  F/u shoulder--has started PT and is improved about 20%. Movement less painful.  No new shulder sx, sleeping better at night on it  2) wrists seemsomewhat stiff and she ahs some ahnd tremor taht seems to be getting worse and this worries her.  3) has had some  Significant med changes--new Dr is Dr Orlene Erm at [facility] as she is in school there. Sleeping ok, not as anxious.  4) noticed her  Hands tremble some--seems more than in  Past--esp when she is eating is when she notices it. No loss of strngth or sensation  Review of Systems No fever, no weight loss,  Denies significant mood issues or swings    Objective:   Physical Exam     GEN WD NAD  SHOULDER  FROM with some pain on impingement testing, rotator cuff strength intact allplanes WRISTS left well healed scar haht is mildly ttp but no sign  Of infection. Mild pain B with resisted flexion NEURO Fine tremor hands on extension. No other focal neuro deficitts  Assessment & Plan:  1) Shoulder pain right & 2) wrist stiffness / pain :  Continue and complete PT for shoulder. Will add wrist eval for HEP to her PT rtc 1 m 3) essential tremor most likely made somewhat worse by her adderall. She is on toprol so is likely getting some benefit from that. Discussed benign nature. Will follow and told her to mention to her PCP

## 2010-06-23 ENCOUNTER — Ambulatory Visit: Payer: PRIVATE HEALTH INSURANCE | Admitting: Physical Therapy

## 2010-06-27 ENCOUNTER — Inpatient Hospital Stay (INDEPENDENT_AMBULATORY_CARE_PROVIDER_SITE_OTHER)
Admission: RE | Admit: 2010-06-27 | Discharge: 2010-06-27 | Disposition: A | Discharge status: Home / Self Care | Payer: Self-pay | Source: Ambulatory Visit

## 2010-06-27 DIAGNOSIS — IMO0002 Reserved for concepts with insufficient information to code with codable children: Secondary | ICD-10-CM

## 2010-06-28 ENCOUNTER — Ambulatory Visit (INDEPENDENT_AMBULATORY_CARE_PROVIDER_SITE_OTHER): Payer: Self-pay | Admitting: Family Medicine

## 2010-06-28 ENCOUNTER — Encounter: Payer: PRIVATE HEALTH INSURANCE | Admitting: Physical Therapy

## 2010-06-28 VITALS — BP 122/88 | HR 66 | Temp 98.2°F | Ht 70.0 in | Wt 229.0 lb

## 2010-06-28 DIAGNOSIS — M25519 Pain in unspecified shoulder: Secondary | ICD-10-CM

## 2010-06-28 DIAGNOSIS — M25511 Pain in right shoulder: Secondary | ICD-10-CM

## 2010-06-28 MED ORDER — HYDROCODONE-ACETAMINOPHEN 5-500 MG PO TABS: 1.0000 | ORAL_TABLET | Freq: Four times a day (QID) | ORAL | Status: DC | PRN

## 2010-06-28 NOTE — Patient Instructions (Signed)
I do not think you tore your rotator cuff or your biceps tendon with the fall. You did strain your rotator cuff and biceps tendon again, however. Ice the area 15 minutes at a time 3-4 times a day. Take aleve 2 tabs twice a day regularly with food for pain and inflammation. Vicodin as needed for severe pain in addition to this (should always use the aleve first). Continue with physical therapy starting back next week. Sling as needed but don't rely on this - want you moving shoulder (pendulums, arm circles, table slides) to work on getting motion back. Follow up with me in 1 month for a recheck on your status.

## 2010-06-30 ENCOUNTER — Encounter: Payer: Self-pay | Admitting: Family Medicine

## 2010-06-30 ENCOUNTER — Ambulatory Visit: Payer: PRIVATE HEALTH INSURANCE | Admitting: Physical Therapy

## 2010-06-30 NOTE — Progress Notes (Signed)
Subjective:    Patient ID: [name], female    DOB: 25-Oct-1970, 40 y.o.   MRN: [id number]  Shoulder Injury    40 yo F here for right shoulder pain s/p new injury on 5/27  Patient was initially here on 4/30 after suffering injury to right shoulder on 4/28 when in backyard digging a garden. While doing so when pushing shovel down, felt a pop with immediate pain within right shoulder. No swelling or bruising. Tried to continue but could only do so if sitting and shovel was held with right arm at her side. + night pain Unable to get arm above head actively now No prior shoulder injuries Is right handed Has also developed pain into right side of neck No bowel/bladder dysfunction No numbness or tingling radiating into arm since injury. MRI showed no rotator cuff tears or biceps tendon tear. She was placed in PT and had been improving - reported at OV a week ago she was 20% improved though she states since then has had a lot more improvement.  Then on 5/27 while at [facility] she slipped on a wet floor and fell directly onto right knee and hand. Pain worsened in right shoulder and intensified during the day Unable to sleep at night due to pain Shoulder feels more stiff Knee has improved since then. Went to urgent care and advised to follow-up here. Using sling as needed, icing and aleve as needed.  Past Medical History  Diagnosis Date  . Bipolar disorder   . Eczema   . Pes planus   . Achilles tendinitis   . Hypertension     Current Outpatient Prescriptions on File Prior to Visit  Medication Sig Dispense Refill  . amphetamine-dextroamphetamine (ADDERALL) 20 MG tablet Take 20 mg by mouth 2 (two) times daily.        . divalproex (DEPAKOTE) 500 MG EC tablet Take 500 mg by mouth 3 (three) times daily.        . hydrochlorothiazide 25 MG tablet Take 25 mg by mouth daily.        . metoprolol (TOPROL-XL) 100 MG 24 hr tablet Take 100 mg by mouth daily.        Marland Kitchen zolpidem  (AMBIEN) 10 MG tablet Take 10 mg by mouth at bedtime as needed.          Past Surgical History  Procedure Date  . Tumor resection left thigh     Allergies  Allergen Reactions  . Morphine   . Penicillins   . Prednisone Hives    History   Social History  . Marital Status: Single    Spouse Name: N/A    Number of Children: N/A  . Years of Education: N/A   Occupational History  . Not on file.   Social History Main Topics  . Smoking status: Never Smoker   . Smokeless tobacco: Not on file  . Alcohol Use: Not on file  . Drug Use: Not on file  . Sexually Active: Not on file   Other Topics Concern  . Not on file   Social History Narrative  . No narrative on file    Family History  Problem Relation Age of Onset  . Hypertension Mother   . Heart attack Father   . Diabetes Paternal Grandfather     BP 122/88  Pulse 66  Temp(Src) 98.2 F (36.8 C) (Oral)  Ht 5\' 10"  (1.778 m)  Wt 229 lb (103.874 kg)  BMI 32.86 kg/m2  Review  of Systems  See HPI above.    Objective:   Physical Exam  Gen: NAD R shoulder: No gross deformity, swelling or bruising. Mod TTP bicipital groove.  No other focal TTP AC joint, other bony TTP. AROM 90 degrees abduction, 90 degrees flexion.  PROM full but with pain. Strength 4+/5 with empty can - improved from last OV though has pain with this.  5/5 with resisted IR/ER + hawkins and yergasons.   Negative apprehension. NVI distally  L shoulder: FROM without pain, swelling, weakness.    MSK u/s: Biceps tendon intact on transverse and longitudinal views - no overlying edema.  No subluxation of biceps tendon on ROM.  Supraspinatus visualized in long and transverse views and is intact without tears.   Assessment & Plan:  1. R shoulder pain - Patient reaggravated supraspinatus strain and also strained biceps tendon but neither have tears within them.  Icing, NSAIDs, rest for next week.  Start codman exercises and go back to PT in 1 week to  start this again.  F/u in 1 month to reassess her progress.

## 2010-07-05 ENCOUNTER — Ambulatory Visit: Payer: PRIVATE HEALTH INSURANCE | Attending: Family Medicine | Admitting: Physical Therapy

## 2010-07-05 DIAGNOSIS — M25519 Pain in unspecified shoulder: Secondary | ICD-10-CM | POA: Insufficient documentation

## 2010-07-05 DIAGNOSIS — M25619 Stiffness of unspecified shoulder, not elsewhere classified: Secondary | ICD-10-CM | POA: Insufficient documentation

## 2010-07-05 DIAGNOSIS — IMO0001 Reserved for inherently not codable concepts without codable children: Secondary | ICD-10-CM | POA: Insufficient documentation

## 2010-07-07 ENCOUNTER — Ambulatory Visit: Payer: PRIVATE HEALTH INSURANCE | Admitting: Physical Therapy

## 2010-07-12 ENCOUNTER — Ambulatory Visit: Payer: PRIVATE HEALTH INSURANCE | Admitting: Physical Therapy

## 2010-07-14 ENCOUNTER — Ambulatory Visit: Payer: PRIVATE HEALTH INSURANCE | Admitting: Physical Therapy

## 2010-07-18 ENCOUNTER — Encounter: Payer: Self-pay | Admitting: Family Medicine

## 2010-07-18 ENCOUNTER — Ambulatory Visit (INDEPENDENT_AMBULATORY_CARE_PROVIDER_SITE_OTHER): Payer: PRIVATE HEALTH INSURANCE | Admitting: Family Medicine

## 2010-07-18 DIAGNOSIS — M25511 Pain in right shoulder: Secondary | ICD-10-CM

## 2010-07-18 DIAGNOSIS — M25539 Pain in unspecified wrist: Secondary | ICD-10-CM

## 2010-07-18 DIAGNOSIS — M25519 Pain in unspecified shoulder: Secondary | ICD-10-CM

## 2010-07-18 NOTE — Progress Notes (Signed)
  Subjective:    Patient ID: [name], female    DOB: October 12, 1970, 40 y.o.   MRN: [id number]  HPI 1.  Right shoulder pain:  FU for shoulder pain.  Diagnosed with initial supraspinatus strain, re aggravated injury and also strained biceps tendon s/p fall 5/28.  Diagnosed by U/S via Dr. Pearletha Forge.  No tears.  Since then has been using ice and occasional NSAIDs as well as PT with some improvement.    2.  Right wrist pain:  Has had wrist stiffness in past, now complains of frank pain.  More on dorsal side of hand.  No numbness. She thinks this is worse and she had a fall. Pain is worse when she extends wrist.   Review of Systems Has had issues with some visual loss in her left eye and is seeing a retinal specialist.    Objective:   Physical Exam    GENERAL: Well-developed well-nourished no acute distress Right wrist: Some pain with extension. The pain is dorsally located over the wrist. She has full range of motion. No pain with supination or pronation. The snuffbox is nontender. Right shoulder some pain still with supraspinatus testing but her strength is intact.   Assessment & Plan:  #1. Right rotator cuff strain and bicep strain. Complete physical therapy she is improving #2. Right wrist pain. History of formation of a large ganglion in her left wrist after trauma. The center she is having now are somewhat similar to that. We will begin her on exercises using a 3 pound dumbbell with wrist flexion and extension. See her back in 3-4 weeks. If she's not improving I would consider MRI given her past history.

## 2010-07-19 ENCOUNTER — Ambulatory Visit: Payer: PRIVATE HEALTH INSURANCE | Admitting: Physical Therapy

## 2010-07-21 ENCOUNTER — Ambulatory Visit: Payer: PRIVATE HEALTH INSURANCE | Admitting: Physical Therapy

## 2010-07-26 ENCOUNTER — Ambulatory Visit: Payer: PRIVATE HEALTH INSURANCE | Admitting: Physical Therapy

## 2010-07-28 ENCOUNTER — Ambulatory Visit: Payer: PRIVATE HEALTH INSURANCE | Admitting: Physical Therapy

## 2010-07-29 ENCOUNTER — Ambulatory Visit: Payer: Self-pay | Admitting: Family Medicine

## 2010-08-02 ENCOUNTER — Ambulatory Visit: Payer: PRIVATE HEALTH INSURANCE | Attending: Family Medicine | Admitting: Physical Therapy

## 2010-08-02 DIAGNOSIS — M25519 Pain in unspecified shoulder: Secondary | ICD-10-CM | POA: Insufficient documentation

## 2010-08-02 DIAGNOSIS — IMO0001 Reserved for inherently not codable concepts without codable children: Secondary | ICD-10-CM | POA: Insufficient documentation

## 2010-08-02 DIAGNOSIS — M25619 Stiffness of unspecified shoulder, not elsewhere classified: Secondary | ICD-10-CM | POA: Insufficient documentation

## 2010-08-05 ENCOUNTER — Ambulatory Visit: Payer: PRIVATE HEALTH INSURANCE | Admitting: Physical Therapy

## 2010-08-08 ENCOUNTER — Other Ambulatory Visit: Payer: Self-pay | Admitting: Family Medicine

## 2010-08-08 ENCOUNTER — Ambulatory Visit (INDEPENDENT_AMBULATORY_CARE_PROVIDER_SITE_OTHER): Payer: PRIVATE HEALTH INSURANCE | Admitting: Family Medicine

## 2010-08-08 ENCOUNTER — Encounter: Payer: Self-pay | Admitting: Family Medicine

## 2010-08-08 VITALS — BP 125/90 | HR 93 | Ht 69.5 in | Wt 222.0 lb

## 2010-08-08 DIAGNOSIS — M25531 Pain in right wrist: Secondary | ICD-10-CM

## 2010-08-08 DIAGNOSIS — M25539 Pain in unspecified wrist: Secondary | ICD-10-CM

## 2010-08-08 DIAGNOSIS — M25519 Pain in unspecified shoulder: Secondary | ICD-10-CM

## 2010-08-08 DIAGNOSIS — M25511 Pain in right shoulder: Secondary | ICD-10-CM

## 2010-08-08 NOTE — Progress Notes (Signed)
  Subjective:    Patient ID: [name], female    DOB: 01/12/1971, 40 y.o.   MRN: [id number]  HPI  #1. Followup right wrist pain. She has been doing the home exercise program now for approximately 4 weeks. Is not having any improvement. Pain is dorsally on the wrist worse with grasping or extension. Denies numbness in the hand. Feels similar to the pain she had with her left ganglion in her wrist. #2. Followup right shoulder pain. She continues in physical therapy twice a week and is improving. She is about 40% better overall. Still pain with certain overhead motions and with placing her hand behind her back.  Review of Systems Denies fever or unusual weight change.    Objective:   Physical Exam     GENERAL: Well-developed female no acute distress SHOULDER: Full range of motion but pain with supraspinatus testing and with range of motion in the supraspinatus sprain above 140. She has intact internal rotation and lift off strength but pain and can only get her hand to L5 on the right where she can get T10 on the left there WRISTS : Dorsal pain with active wrist extension. Pain is in the area of the second through the fourth compartment. Distally she is  Neurovascularly intact. She has intact grip strength. The forearm is without defect and is symmetrical with the left. ULfinTRASOUND: Question of some fluid or ganglion cyst in the compartment 3 and 4. This may be a ganglion cyst or could be free flowing fluid.All of  tthe finger extensor tendons are intact without defect.   Assessment & Plan:  1. Right rotator cuff syndrome. She is improving. We'll continue physical therapy. #2. Right wrist pain after fall. She developed a very odd large ganglion in her left wrist after a similar fall that required surgical excision. Her symptoms are so similar and ultrasound is a little bit concerning for ganglion cyst so will do MRI. [phone] Mother: [phone] ok to leave mssg on eitehr

## 2010-08-08 NOTE — Patient Instructions (Signed)
You have been scheduled for a MRI at [facility], located at [address].  08/09/10 arrive at 10:15 am.   Phone number for [facility] is [phone].

## 2010-08-09 ENCOUNTER — Ambulatory Visit
Admission: RE | Admit: 2010-08-09 | Discharge: 2010-08-09 | Disposition: A | Discharge status: Home / Self Care | Payer: PRIVATE HEALTH INSURANCE | Source: Ambulatory Visit | Attending: Family Medicine | Admitting: Family Medicine

## 2010-08-09 ENCOUNTER — Ambulatory Visit: Payer: PRIVATE HEALTH INSURANCE | Admitting: Physical Therapy

## 2010-08-09 DIAGNOSIS — M25531 Pain in right wrist: Secondary | ICD-10-CM

## 2010-08-10 ENCOUNTER — Encounter: Payer: Self-pay | Admitting: Family Medicine

## 2010-08-10 ENCOUNTER — Ambulatory Visit (INDEPENDENT_AMBULATORY_CARE_PROVIDER_SITE_OTHER): Payer: PRIVATE HEALTH INSURANCE | Admitting: Family Medicine

## 2010-08-10 DIAGNOSIS — R52 Pain, unspecified: Secondary | ICD-10-CM

## 2010-08-10 DIAGNOSIS — M79609 Pain in unspecified limb: Secondary | ICD-10-CM

## 2010-08-10 NOTE — Patient Instructions (Addendum)
For your lipoma, it's a personal decision on whether or not to remove them - typically they are not symptomatic but can cause localized pain and nerve irritation.  You can request a different physician at [facility] or your primary physician can request a referral to a different clinic. I will call you with your bloodwork results - outside of systemic inflammatory diseases causing myalgias and arthralgias, I cannot think of a musculoskeletal condition that would account for your pain in several areas. Ok to use heat or ice as needed. Aleve 2 tabs twice a day with food for inflammation. If your bloodwork is consistent with an inflammatory condition, we will refer you to a rheumatologist for further workup and treatment.

## 2010-08-11 ENCOUNTER — Encounter: Payer: Self-pay | Admitting: Family Medicine

## 2010-08-11 DIAGNOSIS — R52 Pain, unspecified: Secondary | ICD-10-CM | POA: Insufficient documentation

## 2010-08-11 NOTE — Assessment & Plan Note (Signed)
Lipoma - we discussed these are rarely symptomatic and do not typically cause radiating pain though this is possible.  She has had two others right side of thorax removed but was not pleased with the size of scarring.  She will think about if she wants to have this removed.

## 2010-08-11 NOTE — Assessment & Plan Note (Signed)
Patient without objective signs of polymyositis, polyarthritis other than subjective pain in several different muscle groups/joints.  No injury.  No change in medications.  No h/o gout.  No prior h/o inflammatory arthropathy.  Will check CBC with diff, CMP, ANA, ESR, and CRP.  Reassured patient regarding normal exam.

## 2010-08-11 NOTE — Progress Notes (Signed)
Subjective:    Patient ID: [name], female    DOB: 11-29-1970, 40 y.o.   MRN: [id number]  HPI 40 yo F here for severe pain all over.  Patient was seen 2 days ago for follow-up right shoulder rotator cuff tendinopathy, also with wrist pain. Had wrist MRI that showed tendinopathy but no cyst or other abnormalities She reports the last 3 night pain has been increasing in several different areas of her body. Started right proximal thigh where she has a known lipoma - radiated from here down to above her knee. Pain peaked last night including both knees, right thigh, bilateral wrists, both sides of ribcage, shoulders. No swelling or redness of these joints. No known fever. No history of illness. Has not changed any of her usual medications since about February per her report. Had nausea last night also but no other symptoms. Had CMP earlier this year without any concerning abnormalities.  No known rheumatic bloodwork otherwise seen in the chart. No new injuries to any joints or muscles either.  Past Medical History  Diagnosis Date  . Bipolar disorder   . Eczema   . Pes planus   . Achilles tendinitis   . Hypertension     Current Outpatient Prescriptions on File Prior to Visit  Medication Sig Dispense Refill  . amphetamine-dextroamphetamine (ADDERALL) 20 MG tablet Take 10 mg by mouth 2 (two) times daily.       . divalproex (DEPAKOTE) 500 MG EC tablet Take 1,500 mg by mouth at bedtime.       . hydrochlorothiazide 25 MG tablet Take 25 mg by mouth daily.        Marland Kitchen HYDROcodone-acetaminophen (VICODIN) 5-500 MG per tablet Take 1 tablet by mouth every 6 (six) hours as needed for pain.  50 tablet  0  . metoprolol (TOPROL-XL) 100 MG 24 hr tablet Take 100 mg by mouth daily.        Marland Kitchen zolpidem (AMBIEN) 10 MG tablet Take 10 mg by mouth at bedtime as needed.          Past Surgical History  Procedure Date  . Tumor resection left thigh     Allergies  Allergen Reactions  . Morphine   .  Penicillins   . Prednisone Hives  . Provera (Medroxyprogesterone Acetate) Other (See Comments)    Causes manic episodes    History   Social History  . Marital Status: Single    Spouse Name: N/A    Number of Children: N/A  . Years of Education: N/A   Occupational History  . Not on file.   Social History Main Topics  . Smoking status: Never Smoker   . Smokeless tobacco: Never Used  . Alcohol Use: Not on file  . Drug Use: Not on file  . Sexually Active: Not on file   Other Topics Concern  . Not on file   Social History Narrative  . No narrative on file    Family History  Problem Relation Age of Onset  . Hypertension Mother   . Heart attack Father   . Diabetes Paternal Grandfather     BP 126/92  Pulse 86  Temp(Src) 97.9 F (36.6 C) (Oral)  Ht 5\' 10"  (1.778 m)  Wt 221 lb (100.245 kg)  BMI 31.71 kg/m2  Review of Systems See HPI above.    Objective:   Physical Exam Gen: NAD, pleasant.  Ribs: No visible deformity, swelling, or bruising.  R thigh: Small < 1 cm circumferential mobile mass  proximal right thigh c/w lipoma. Mild TTP this area distally to just above knee. No other abnormalities, swelling, bruising, erythema.  R knee: No gross deformities, swelling, bruising. Mild diffuse TTP anterior knee. FROM. Stable to valgus and varus stress.  Negative ant/post drawers.  Negative lachmanns Negative mcmurrays bilaterally Negative apprehension.  L knee: No gross deformities, swelling, bruising. Mild diffuse TTP anterior knee. FROM. Stable to valgus and varus stress.  Negative ant/post drawers.  Negative lachmanns Negative mcmurrays bilaterally Negative apprehension.    Wrists, elbows, shoulders: No evidence of swelling, erythema, warmth of these joints.   Assessment & Plan:  1. Generalized pain - Patient without objective signs of polymyositis, polyarthritis other than subjective pain in several different muscle groups/joints.  No injury.  No  change in medications.  No h/o gout.  No prior h/o inflammatory arthropathy.  Will check CBC with diff, CMP, ANA, ESR, and CRP.  Reassured patient regarding normal exam.  2. Lipoma - we discussed these are rarely symptomatic and do not typically cause radiating pain though this is possible.  She has had two others right side of thorax removed but was not pleased with the size of scarring.  She will think about if she wants to have this removed.

## 2010-08-12 ENCOUNTER — Ambulatory Visit: Payer: PRIVATE HEALTH INSURANCE | Admitting: Physical Therapy

## 2010-08-12 ENCOUNTER — Telehealth: Payer: Self-pay | Admitting: Family Medicine

## 2010-08-12 NOTE — Telephone Encounter (Signed)
Called patient and discussed her lab results - CBC with diff, ESR, CMP, CK, CRP, ANA all normal (CBC only showed slightly low PMNs and slightly high Lymphs but counts normal) and not showing cause for her generalized pain.  Reassured her.  She is going to see general surgeon near end of July to discuss possible right thigh lipoma removal.

## 2010-08-16 ENCOUNTER — Ambulatory Visit: Payer: PRIVATE HEALTH INSURANCE | Admitting: Physical Therapy

## 2010-08-17 ENCOUNTER — Telehealth: Payer: Self-pay | Admitting: Family Medicine

## 2010-08-17 NOTE — Telephone Encounter (Signed)
[name] dont forget to call pam re her MRI--NO mass--just some tendinopathy. No change in our treatment program THANKS! [name]

## 2010-08-17 NOTE — Telephone Encounter (Signed)
Called pt- advised her of the MRI results per Dr. Jennette Kettle.

## 2010-08-18 ENCOUNTER — Other Ambulatory Visit: Payer: Self-pay | Admitting: Family Medicine

## 2010-08-18 DIAGNOSIS — Z1231 Encounter for screening mammogram for malignant neoplasm of breast: Secondary | ICD-10-CM

## 2010-08-19 ENCOUNTER — Ambulatory Visit: Payer: PRIVATE HEALTH INSURANCE | Admitting: Physical Therapy

## 2010-08-22 ENCOUNTER — Encounter (INDEPENDENT_AMBULATORY_CARE_PROVIDER_SITE_OTHER): Payer: Self-pay | Admitting: Surgery

## 2010-08-23 ENCOUNTER — Ambulatory Visit: Payer: PRIVATE HEALTH INSURANCE | Admitting: Physical Therapy

## 2010-08-24 ENCOUNTER — Encounter (INDEPENDENT_AMBULATORY_CARE_PROVIDER_SITE_OTHER): Payer: Self-pay | Admitting: Surgery

## 2010-08-24 ENCOUNTER — Ambulatory Visit (INDEPENDENT_AMBULATORY_CARE_PROVIDER_SITE_OTHER): Payer: PRIVATE HEALTH INSURANCE | Admitting: Surgery

## 2010-08-24 VITALS — BP 120/82 | HR 66 | Temp 98.1°F | Ht 69.5 in | Wt 224.0 lb

## 2010-08-24 DIAGNOSIS — D1723 Benign lipomatous neoplasm of skin and subcutaneous tissue of right leg: Secondary | ICD-10-CM

## 2010-08-24 DIAGNOSIS — D1739 Benign lipomatous neoplasm of skin and subcutaneous tissue of other sites: Secondary | ICD-10-CM

## 2010-08-24 NOTE — Progress Notes (Signed)
[name] is a 40 y.o. female.    Chief Complaint  Patient presents with  . Lipoma    rt thigh    HPI HPI This is a patient who has been seen  in our office before for lipomas. She is now developed a tender mass on her right thigh anteriorly. It causes her pain down the thigh. She denies any trauma. Otherwise he is without complaints.  Past Medical History  Diagnosis Date  . Bipolar disorder   . Eczema   . Pes planus   . Achilles tendinitis   . Hypertension   . Migraine   . Achilles tendinitis   . Ganglion cyst 09/29/2009    left wrist (2 cyst)  . Arthritis   . Allergy   . Lipoma     Past Surgical History  Procedure Date  . Tumor resection left thigh   . Ankle surgery 12/88    left   . Lipoma removal   . Ganglion cyst excision 2011  . Chest nodule 1990?    rt chest wall nodule removal    Family History  Problem Relation Age of Onset  . Hypertension Mother   . Hyperlipidemia Mother   . Heart attack Father   . Heart disease Father   . Diabetes Paternal Grandfather     Social History History  Substance Use Topics  . Smoking status: Never Smoker   . Smokeless tobacco: Never Used  . Alcohol Use: No    Allergies  Allergen Reactions  . Morphine   . Penicillins   . Prednisone Hives  . Provera (Medroxyprogesterone Acetate) Other (See Comments)    Causes manic episodes    Current Outpatient Prescriptions  Medication Sig Dispense Refill  . amphetamine-dextroamphetamine (ADDERALL) 20 MG tablet Take 10 mg by mouth 2 (two) times daily.       . divalproex (DEPAKOTE) 500 MG EC tablet Take 1,500 mg by mouth at bedtime.       . hydrochlorothiazide 25 MG tablet Take 25 mg by mouth daily.        Marland Kitchen HYDROcodone-acetaminophen (VICODIN) 5-500 MG per tablet Take 1 tablet by mouth every 6 (six) hours as needed for pain.  50 tablet  0  . metoprolol (TOPROL-XL) 100 MG 24 hr tablet Take 100 mg by mouth daily.        . Norethin Ace-Eth Estrad-FE (LOESTRIN FE 1/20 PO)  Take 1 tablet by mouth daily.        . SUMAtriptan Succinate (IMITREX IJ) Inject 1 Syringe as directed as needed.        . zolpidem (AMBIEN) 10 MG tablet Take 10 mg by mouth at bedtime as needed.          Review of Systems ROS Negative for chest pain, shortness breath, fever Physical Exam Physical Exam  Lungs clear bilaterally. Cardiovascular is regular rate and rhythm. On examination of the thigh of the right leg she has a palpable 1 cm mass anteriorly which is deep. It is mobile. There are no skin changes. Blood pressure 120/82, pulse 66, temperature 98.1 F (36.7 C), temperature source Temporal, height 5' 9.5" (1.765 m), weight 224 lb (101.606 kg).  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 Removal of this is recommended to rule in malignancy as there is a significant family history of malignancies. I discussed the risks which include bleeding, infection, injury to other structures, the testis may not resolve her symptoms, and recurrence. She understands and wishes  to proceed.  [name] A 08/24/2010, 2:07 PM

## 2010-08-25 ENCOUNTER — Ambulatory Visit: Payer: PRIVATE HEALTH INSURANCE | Admitting: Physical Therapy

## 2010-08-29 ENCOUNTER — Ambulatory Visit
Admission: RE | Admit: 2010-08-29 | Discharge: 2010-08-29 | Disposition: A | Discharge status: Home / Self Care | Payer: PRIVATE HEALTH INSURANCE | Source: Ambulatory Visit | Attending: Family Medicine | Admitting: Family Medicine

## 2010-08-29 ENCOUNTER — Ambulatory Visit: Payer: PRIVATE HEALTH INSURANCE

## 2010-08-29 DIAGNOSIS — Z1231 Encounter for screening mammogram for malignant neoplasm of breast: Secondary | ICD-10-CM

## 2010-08-30 ENCOUNTER — Other Ambulatory Visit (INDEPENDENT_AMBULATORY_CARE_PROVIDER_SITE_OTHER): Payer: Self-pay | Admitting: Surgery

## 2010-08-30 ENCOUNTER — Encounter (HOSPITAL_COMMUNITY): Payer: PRIVATE HEALTH INSURANCE

## 2010-08-30 LAB — BASIC METABOLIC PANEL
BUN: 6 mg/dL (ref 6–23)
CO2: 27 mEq/L (ref 19–32)
Calcium: 9.9 mg/dL (ref 8.4–10.5)
Chloride: 103 mEq/L (ref 96–112)
Creatinine, Ser: 0.74 mg/dL (ref 0.50–1.10)
GFR calc Af Amer: >60 mL/min (ref >60)
GFR calc non Af Amer: >60 mL/min (ref >60)
Glucose, Bld: 108 mg/dL — ABNORMAL HIGH (ref 70–99)
Potassium: 3.6 mEq/L (ref 3.5–5.1)
Sodium: 139 mEq/L (ref 135–145)

## 2010-08-30 LAB — CBC
HCT: 41.6 % (ref 36.0–46.0)
Hemoglobin: 14.1 g/dL (ref 12.0–15.0)
MCH: 30.3 pg (ref 26.0–34.0)
MCHC: 33.9 g/dL (ref 30.0–36.0)
MCV: 89.3 fL (ref 78.0–100.0)
Platelets: 193 10*3/uL (ref 150–400)
RBC: 4.66 MIL/uL (ref 3.87–5.11)
RDW: 12 % (ref 11.5–15.5)
WBC: 5.1 10*3/uL (ref 4.0–10.5)

## 2010-08-30 LAB — SURGICAL PCR SCREEN
MRSA, PCR: NEGATIVE
Staphylococcus aureus: NEGATIVE

## 2010-08-30 LAB — HCG, SERUM, QUALITATIVE: Preg, Serum: NEGATIVE

## 2010-09-01 ENCOUNTER — Ambulatory Visit (HOSPITAL_COMMUNITY)
Admission: RE | Admit: 2010-09-01 | Discharge: 2010-09-01 | Disposition: A | Discharge status: Home / Self Care | Payer: PRIVATE HEALTH INSURANCE | Source: Ambulatory Visit | Attending: Surgery | Admitting: Surgery

## 2010-09-01 ENCOUNTER — Other Ambulatory Visit (INDEPENDENT_AMBULATORY_CARE_PROVIDER_SITE_OTHER): Payer: Self-pay | Admitting: Surgery

## 2010-09-01 DIAGNOSIS — R229 Localized swelling, mass and lump, unspecified: Secondary | ICD-10-CM | POA: Insufficient documentation

## 2010-09-01 DIAGNOSIS — I10 Essential (primary) hypertension: Secondary | ICD-10-CM | POA: Insufficient documentation

## 2010-09-01 DIAGNOSIS — Z01812 Encounter for preprocedural laboratory examination: Secondary | ICD-10-CM | POA: Insufficient documentation

## 2010-09-01 DIAGNOSIS — D1739 Benign lipomatous neoplasm of skin and subcutaneous tissue of other sites: Secondary | ICD-10-CM | POA: Insufficient documentation

## 2010-09-02 ENCOUNTER — Ambulatory Visit (INDEPENDENT_AMBULATORY_CARE_PROVIDER_SITE_OTHER): Payer: PRIVATE HEALTH INSURANCE | Admitting: Family Medicine

## 2010-09-02 ENCOUNTER — Encounter: Payer: Self-pay | Admitting: Family Medicine

## 2010-09-02 VITALS — BP 113/75 | HR 62

## 2010-09-02 DIAGNOSIS — C499 Malignant neoplasm of connective and soft tissue, unspecified: Secondary | ICD-10-CM

## 2010-09-02 DIAGNOSIS — M25511 Pain in right shoulder: Secondary | ICD-10-CM

## 2010-09-02 DIAGNOSIS — M25519 Pain in unspecified shoulder: Secondary | ICD-10-CM

## 2010-09-02 HISTORY — DX: Malignant neoplasm of connective and soft tissue, unspecified: C49.9

## 2010-09-02 NOTE — Progress Notes (Signed)
  Subjective:    Patient ID: [name], female    DOB: 1970-07-26, 40 y.o.   MRN: [id number]  HPI  F/u r shoulder pain Improved w PT--she has completed PT. Still pain in shoulder at night when she tries to lie on taht side wanst to try injection  PERTINENT  PMH / PSH:\ Has hx of hives wit oral prednisone but has had a CSI before without issue. Review of Systems    Pertinent review of systems: negative for fever or unusual weight change.  Objective:   Physical Exam Shoulder without obvious defect.  Negative sulcus sign. ROM: Forward flexion    180 External rotation:  45 Internal rotation:    35  STRENGTH: Forward flexion    full External rotation   full Internal rotation     full  Impingement signs:   Mildly positive Biceps tendon:          normal AC joint:                     normal O'Brien's test:            normal   PAIN :                       mild Distally neurovasculalry intact     INJECTION: Patient was given informed consent, signed copy in the chart. Appropriate time out was taken. Area prepped and draped in usual sterile fashion. 1 cc of kenalog plus  4 cc of lidocaine was injected into the right shoulder using a(n) posterior approach. The patient tolerated the procedure well. There were no complications. Post procedure instructions were given.     Assessment & Plan:  Continued shoulder pain.  Has competd PT. Will try single CSI as above

## 2010-09-06 ENCOUNTER — Telehealth (INDEPENDENT_AMBULATORY_CARE_PROVIDER_SITE_OTHER): Payer: Self-pay | Admitting: General Surgery

## 2010-09-06 NOTE — Telephone Encounter (Signed)
Patient called s/p lipoma excision on upper hip area, now open in a couple places about a quarter inch. I advised we could take a look at it today, but patient can not come in. Patient states no redness/ no drainage. I advised if couldn't be seen today to call back if gets worse and to keep area clean and dry.

## 2010-09-07 NOTE — Op Note (Signed)
  [name], [name]               ACCOUNT NO.:  [id number]  MEDICAL RECORD NO.:  [id number]  LOCATION:  DAYL                         FACILITY:  [facility]  PHYSICIAN:  Abigail Miyamoto, M.D. DATE OF BIRTH:  [birth year]  DATE OF PROCEDURE:  09/01/2010 DATE OF DISCHARGE:  09/01/2010                              OPERATIVE REPORT   PREOPERATIVE DIAGNOSIS:  Right thigh mass.  POSTOPERATIVE DIAGNOSIS:  Right thigh mass.  PROCEDURE:  Excision of subcutaneous 2 cm right thigh mass.  SURGEON:  Abigail Miyamoto, MD  ANESTHESIA:  General and 0.5% Marcaine.  ESTIMATED BLOOD LOSS:  Minimal.  FINDINGS:  The patient was found to have a 2 cm subcutaneous mass which appeared consistent with lipoma.  It was sent to Pathology for evaluation.  PROCEDURE IN DETAIL:  The patient brought to the operative room, identified as [name].  She was placed supine on the operating table and anesthesia was induced.  Her right thigh was then prepped and draped in usual sterile fashion.  The mass was along the anterior thigh right in the middle.  I anesthetized the skin with 0.5% Marcaine and then made a small incision with a #15 blade, I took this down to subcutaneous tissue with electrocautery.  The mass became easily apparent and appeared cystic lipoma and was easily excised with cautery. I then sent the mass to Pathology for evaluation.  Hemostasis was achieved with cautery.  I then closed subcutaneous tissue with interrupted 3-0 Vicryl sutures and closed the skin with running 4-0 Monocryl.  Dermabond was then applied.  The patient tolerated procedure well.  All counts were correct at the end of the procedure.  The patient was then extubated in the operating room and taken in stable condition to recovery room.     Abigail Miyamoto, M.D.     DB/MEDQ  D:  09/01/2010  T:  09/02/2010  Job:  [id number]  Electronically Signed by Abigail Miyamoto M.D. on 09/07/2010 10:08:47 AM

## 2010-09-12 ENCOUNTER — Ambulatory Visit (INDEPENDENT_AMBULATORY_CARE_PROVIDER_SITE_OTHER): Payer: PRIVATE HEALTH INSURANCE | Admitting: Surgery

## 2010-09-12 DIAGNOSIS — Z09 Encounter for follow-up examination after completed treatment for conditions other than malignant neoplasm: Secondary | ICD-10-CM

## 2010-09-12 NOTE — Progress Notes (Signed)
Subjective:     Patient ID: [name], female   DOB: [birth year], 40 y.o.   MRN: [id number]  HPI  She is here for her first postoperative visit status post excision of a lipoma from the right thigh. She is doing well and has no complaints. Review of Systems     Objective:   Physical Exam On exam, the incision is healing well. There is no evidence of infection.  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 She may return to normal activity.

## 2010-09-13 ENCOUNTER — Encounter: Payer: Self-pay | Admitting: Family Medicine

## 2010-09-16 ENCOUNTER — Emergency Department (HOSPITAL_COMMUNITY)
Admission: EM | Admit: 2010-09-16 | Discharge: 2010-09-16 | Disposition: A | Discharge status: Home / Self Care | Payer: PRIVATE HEALTH INSURANCE | Attending: Emergency Medicine | Admitting: Emergency Medicine

## 2010-09-16 ENCOUNTER — Emergency Department (HOSPITAL_COMMUNITY): Payer: PRIVATE HEALTH INSURANCE

## 2010-09-16 DIAGNOSIS — R079 Chest pain, unspecified: Secondary | ICD-10-CM | POA: Insufficient documentation

## 2010-09-16 DIAGNOSIS — F319 Bipolar disorder, unspecified: Secondary | ICD-10-CM | POA: Insufficient documentation

## 2010-09-16 DIAGNOSIS — R51 Headache: Secondary | ICD-10-CM | POA: Insufficient documentation

## 2010-09-16 DIAGNOSIS — F411 Generalized anxiety disorder: Secondary | ICD-10-CM | POA: Insufficient documentation

## 2010-09-16 DIAGNOSIS — I10 Essential (primary) hypertension: Secondary | ICD-10-CM | POA: Insufficient documentation

## 2010-09-16 DIAGNOSIS — Z9889 Other specified postprocedural states: Secondary | ICD-10-CM | POA: Insufficient documentation

## 2010-09-16 DIAGNOSIS — F988 Other specified behavioral and emotional disorders with onset usually occurring in childhood and adolescence: Secondary | ICD-10-CM | POA: Insufficient documentation

## 2010-09-16 LAB — POCT I-STAT, CHEM 8
BUN: 8 mg/dL (ref 6–23)
Calcium, Ion: 1.22 mmol/L (ref 1.12–1.32)
Chloride: 101 mEq/L (ref 96–112)
Creatinine, Ser: 0.9 mg/dL (ref 0.50–1.10)
Glucose, Bld: 97 mg/dL (ref 70–99)
HCT: 41 % (ref 36.0–46.0)
Hemoglobin: 13.9 g/dL (ref 12.0–15.0)
Potassium: 3.5 mEq/L (ref 3.5–5.1)
Sodium: 140 mEq/L (ref 135–145)
TCO2: 28 mmol/L (ref 0–100)

## 2010-09-16 LAB — POCT I-STAT TROPONIN I
Troponin i, poc: 0 ng/mL (ref 0.00–0.08)
Troponin i, poc: 0 ng/mL (ref 0.00–0.08)

## 2010-09-16 LAB — CBC
HCT: 39.8 % (ref 36.0–46.0)
Hemoglobin: 13.9 g/dL (ref 12.0–15.0)
MCH: 31.4 pg (ref 26.0–34.0)
MCHC: 34.9 g/dL (ref 30.0–36.0)
MCV: 90 fL (ref 78.0–100.0)
Platelets: 188 10*3/uL (ref 150–400)
RBC: 4.42 MIL/uL (ref 3.87–5.11)
RDW: 12.1 % (ref 11.5–15.5)
WBC: 7 10*3/uL (ref 4.0–10.5)

## 2010-09-16 LAB — DIFFERENTIAL
Basophils Absolute: 0 10*3/uL (ref 0.0–0.1)
Basophils Relative: 0 % (ref 0–1)
Eosinophils Absolute: 0.1 10*3/uL (ref 0.0–0.7)
Eosinophils Relative: 2 % (ref 0–5)
Lymphocytes Relative: 44 % (ref 12–46)
Lymphs Abs: 3 10*3/uL (ref 0.7–4.0)
Monocytes Absolute: 0.7 10*3/uL (ref 0.1–1.0)
Monocytes Relative: 9 % (ref 3–12)
Neutro Abs: 3.2 10*3/uL (ref 1.7–7.7)
Neutrophils Relative %: 45 % (ref 43–77)

## 2010-09-16 LAB — D-DIMER, QUANTITATIVE: D-Dimer, Quant: <0.22 ug/mL-FEU (ref 0.00–0.48)

## 2010-09-23 ENCOUNTER — Emergency Department (HOSPITAL_COMMUNITY)
Admission: EM | Admit: 2010-09-23 | Discharge: 2010-09-23 | Disposition: A | Discharge status: Home / Self Care | Payer: PRIVATE HEALTH INSURANCE | Attending: Emergency Medicine | Admitting: Emergency Medicine

## 2010-09-23 ENCOUNTER — Emergency Department (HOSPITAL_COMMUNITY): Admission: EM | Admit: 2010-09-23 | Payer: Self-pay | Source: Home / Self Care

## 2010-09-23 DIAGNOSIS — I10 Essential (primary) hypertension: Secondary | ICD-10-CM | POA: Insufficient documentation

## 2010-09-23 DIAGNOSIS — G43909 Migraine, unspecified, not intractable, without status migrainosus: Secondary | ICD-10-CM | POA: Insufficient documentation

## 2010-09-23 DIAGNOSIS — R079 Chest pain, unspecified: Secondary | ICD-10-CM | POA: Insufficient documentation

## 2010-09-27 ENCOUNTER — Emergency Department (HOSPITAL_COMMUNITY)
Admission: EM | Admit: 2010-09-27 | Discharge: 2010-09-28 | Disposition: A | Discharge status: Other Acute Inpatient Hospital | Payer: PRIVATE HEALTH INSURANCE | Attending: Emergency Medicine | Admitting: Emergency Medicine

## 2010-09-27 DIAGNOSIS — Z79899 Other long term (current) drug therapy: Secondary | ICD-10-CM | POA: Insufficient documentation

## 2010-09-27 DIAGNOSIS — F319 Bipolar disorder, unspecified: Secondary | ICD-10-CM | POA: Insufficient documentation

## 2010-09-27 DIAGNOSIS — I10 Essential (primary) hypertension: Secondary | ICD-10-CM | POA: Insufficient documentation

## 2010-09-27 DIAGNOSIS — R45851 Suicidal ideations: Secondary | ICD-10-CM | POA: Insufficient documentation

## 2010-09-27 LAB — RAPID URINE DRUG SCREEN, HOSP PERFORMED
Amphetamines: NOT DETECTED
Barbiturates: NOT DETECTED
Benzodiazepines: NOT DETECTED
Cocaine: NOT DETECTED
Opiates: NOT DETECTED
Tetrahydrocannabinol: NOT DETECTED

## 2010-09-27 LAB — CBC
HCT: 42.3 % (ref 36.0–46.0)
Hemoglobin: 14.5 g/dL (ref 12.0–15.0)
MCH: 31 pg (ref 26.0–34.0)
MCHC: 34.3 g/dL (ref 30.0–36.0)
MCV: 90.4 fL (ref 78.0–100.0)
Platelets: 171 10*3/uL (ref 150–400)
RBC: 4.68 MIL/uL (ref 3.87–5.11)
RDW: 12.1 % (ref 11.5–15.5)
WBC: 7.7 10*3/uL (ref 4.0–10.5)

## 2010-09-27 LAB — COMPREHENSIVE METABOLIC PANEL
ALT: 11 U/L (ref 0–35)
AST: 18 U/L (ref 0–37)
Albumin: 3.6 g/dL (ref 3.5–5.2)
Alkaline Phosphatase: 60 U/L (ref 39–117)
BUN: 10 mg/dL (ref 6–23)
CO2: 29 mEq/L (ref 19–32)
Calcium: 9.7 mg/dL (ref 8.4–10.5)
Chloride: 98 mEq/L (ref 96–112)
Creatinine, Ser: 0.68 mg/dL (ref 0.50–1.10)
GFR calc Af Amer: >60 mL/min (ref >60)
GFR calc non Af Amer: >60 mL/min (ref >60)
Glucose, Bld: 101 mg/dL — ABNORMAL HIGH (ref 70–99)
Potassium: 3.5 mEq/L (ref 3.5–5.1)
Sodium: 137 mEq/L (ref 135–145)
Total Bilirubin: 0.4 mg/dL (ref 0.3–1.2)
Total Protein: 6.5 g/dL (ref 6.0–8.3)

## 2010-09-27 LAB — DIFFERENTIAL
Basophils Absolute: 0 10*3/uL (ref 0.0–0.1)
Basophils Relative: 0 % (ref 0–1)
Eosinophils Absolute: 0 10*3/uL (ref 0.0–0.7)
Eosinophils Relative: 0 % (ref 0–5)
Lymphocytes Relative: 33 % (ref 12–46)
Lymphs Abs: 2.6 10*3/uL (ref 0.7–4.0)
Monocytes Absolute: 0.6 10*3/uL (ref 0.1–1.0)
Monocytes Relative: 8 % (ref 3–12)
Neutro Abs: 4.5 10*3/uL (ref 1.7–7.7)
Neutrophils Relative %: 59 % (ref 43–77)

## 2010-09-27 LAB — POCT PREGNANCY, URINE: Preg Test, Ur: NEGATIVE

## 2010-09-27 LAB — ETHANOL: Alcohol, Ethyl (B): <11 mg/dL (ref 0–11)

## 2010-09-28 ENCOUNTER — Inpatient Hospital Stay (HOSPITAL_COMMUNITY)
Admission: AD | Admit: 2010-09-28 | Discharge: 2010-10-05 | DRG: 885 | Disposition: A | Discharge status: Home / Self Care | Payer: Medicare Other | Source: Ambulatory Visit | Attending: Psychiatry | Admitting: Psychiatry

## 2010-09-28 DIAGNOSIS — F339 Major depressive disorder, recurrent, unspecified: Principal | ICD-10-CM

## 2010-09-28 DIAGNOSIS — F603 Borderline personality disorder: Secondary | ICD-10-CM

## 2010-09-28 DIAGNOSIS — I10 Essential (primary) hypertension: Secondary | ICD-10-CM

## 2010-09-28 DIAGNOSIS — Z79899 Other long term (current) drug therapy: Secondary | ICD-10-CM

## 2010-09-28 DIAGNOSIS — E229 Hyperfunction of pituitary gland, unspecified: Secondary | ICD-10-CM

## 2010-09-28 DIAGNOSIS — F411 Generalized anxiety disorder: Secondary | ICD-10-CM

## 2010-09-28 DIAGNOSIS — G43909 Migraine, unspecified, not intractable, without status migrainosus: Secondary | ICD-10-CM

## 2010-09-28 DIAGNOSIS — K5909 Other constipation: Secondary | ICD-10-CM

## 2010-09-28 DIAGNOSIS — R45851 Suicidal ideations: Secondary | ICD-10-CM

## 2010-09-29 DIAGNOSIS — F411 Generalized anxiety disorder: Secondary | ICD-10-CM

## 2010-09-29 DIAGNOSIS — F603 Borderline personality disorder: Secondary | ICD-10-CM

## 2010-09-29 DIAGNOSIS — F339 Major depressive disorder, recurrent, unspecified: Secondary | ICD-10-CM

## 2010-09-29 DIAGNOSIS — F909 Attention-deficit hyperactivity disorder, unspecified type: Secondary | ICD-10-CM

## 2010-09-29 LAB — T3, FREE: T3, Free: 3.3 pg/mL (ref 2.3–4.2)

## 2010-09-29 LAB — VALPROIC ACID LEVEL: Valproic Acid Lvl: 23.6 ug/mL — ABNORMAL LOW (ref 50.0–100.0)

## 2010-10-01 DIAGNOSIS — F411 Generalized anxiety disorder: Secondary | ICD-10-CM

## 2010-10-01 DIAGNOSIS — F909 Attention-deficit hyperactivity disorder, unspecified type: Secondary | ICD-10-CM

## 2010-10-01 DIAGNOSIS — F603 Borderline personality disorder: Secondary | ICD-10-CM

## 2010-10-01 DIAGNOSIS — F339 Major depressive disorder, recurrent, unspecified: Secondary | ICD-10-CM

## 2010-10-02 LAB — COMPREHENSIVE METABOLIC PANEL
ALT: 14 U/L (ref 0–35)
AST: 14 U/L (ref 0–37)
Albumin: 3.1 g/dL — ABNORMAL LOW (ref 3.5–5.2)
Alkaline Phosphatase: 52 U/L (ref 39–117)
BUN: 9 mg/dL (ref 6–23)
CO2: 32 mEq/L (ref 19–32)
Calcium: 9.4 mg/dL (ref 8.4–10.5)
Chloride: 104 mEq/L (ref 96–112)
Creatinine, Ser: 0.77 mg/dL (ref 0.50–1.10)
GFR calc Af Amer: >60 mL/min (ref >60)
GFR calc non Af Amer: >60 mL/min (ref >60)
Glucose, Bld: 95 mg/dL (ref 70–99)
Potassium: 4.1 mEq/L (ref 3.5–5.1)
Sodium: 141 mEq/L (ref 135–145)
Total Bilirubin: 0.3 mg/dL (ref 0.3–1.2)
Total Protein: 5.7 g/dL — ABNORMAL LOW (ref 6.0–8.3)

## 2010-10-02 LAB — LIPID PANEL
Cholesterol: 164 mg/dL (ref 0–200)
HDL: 54 mg/dL (ref >39)
LDL Cholesterol: 94 mg/dL (ref 0–99)
Total CHOL/HDL Ratio: 3 RATIO
Triglycerides: 81 mg/dL (ref <150)
VLDL: 16 mg/dL (ref 0–40)

## 2010-10-02 LAB — DIFFERENTIAL
Basophils Absolute: 0 10*3/uL (ref 0.0–0.1)
Basophils Relative: 1 % (ref 0–1)
Eosinophils Absolute: 0.2 10*3/uL (ref 0.0–0.7)
Eosinophils Relative: 3 % (ref 0–5)
Lymphocytes Relative: 58 % — ABNORMAL HIGH (ref 12–46)
Lymphs Abs: 3.5 10*3/uL (ref 0.7–4.0)
Monocytes Absolute: 0.5 10*3/uL (ref 0.1–1.0)
Monocytes Relative: 9 % (ref 3–12)
Neutro Abs: 1.8 10*3/uL (ref 1.7–7.7)
Neutrophils Relative %: 30 % — ABNORMAL LOW (ref 43–77)

## 2010-10-02 LAB — CBC
HCT: 39.4 % (ref 36.0–46.0)
Hemoglobin: 13.3 g/dL (ref 12.0–15.0)
MCH: 31 pg (ref 26.0–34.0)
MCHC: 33.8 g/dL (ref 30.0–36.0)
MCV: 91.8 fL (ref 78.0–100.0)
Platelets: 196 10*3/uL (ref 150–400)
RBC: 4.29 MIL/uL (ref 3.87–5.11)
RDW: 12.1 % (ref 11.5–15.5)
WBC: 5.9 10*3/uL (ref 4.0–10.5)

## 2010-10-02 LAB — T4, FREE: Free T4: 1.13 ng/dL (ref 0.80–1.80)

## 2010-10-02 LAB — TSH: TSH: 1.063 u[IU]/mL (ref 0.350–4.500)

## 2010-10-02 LAB — VALPROIC ACID LEVEL: Valproic Acid Lvl: 79.5 ug/mL (ref 50.0–100.0)

## 2010-10-03 NOTE — Assessment & Plan Note (Signed)
[name], VASCONEZ               ACCOUNT NO.:  [id number]  MEDICAL RECORD NO.:  [id number]  LOCATION:  4098                          FACILITY:  [facility]  PHYSICIAN:  Franchot Gallo, MD     DATE OF BIRTH:  08-Oct-1970  DATE OF ADMISSION:  09/28/2010 DATE OF DISCHARGE:                      PSYCHIATRIC ADMISSION ASSESSMENT   This is a voluntary admission to the services of Dr. Harvie Heck [place].  This is a 40 year old single white female.  She presented to the ED at [facility].  She stated that she was under a lot of stress, that she does not have any support, that she feels hopeless, that she had a plan to run through an intersection in hopes to get hit by a truck.  She was with 1 of her professors.  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  [name] is well known, I have known her for at least 15 years now.  She states that on April 28, she strained her right rotator cuff.  On May 27, she fell at [employer].  On June 14, she had a left vitreal detachment.  They wanted to watch it, as there was a large component of anticipation for retinal tear.  She did indeed have a retinal tear on the 28th, requiring laser surgery on June 29.  On August 2, she had a lipoma removed from her leg.  On August 3, she had another injection to her right shoulder.  On the last 2 Fridays, she has been to the ED for migraines.  She currently still has a migraine.  It is a 7/10.  Her right rotator cuff is still hurting her.  Her neck is hurting her.  She was told recently that she has 2 bulging disks, and her left eye still hurts.  [name] was last with [place] back in February.  She was here from August 22 to August 29.  She had come in due to suicidal ideation at that time as well.  PAST PSYCHIATRIC HISTORY:  She has had numerous inpatient stays here. She is currently under care with [facility], Madelaine Etienne.  She has had 5 sessions.  She is followed through Dr. Runell Gess office by a Lauris Poag, nurse practitioner.  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  She lives with her mother, and she does receive SSDI.  PAST MEDICAL HISTORY:  Chronic migraines, chronic constipation, hypertension.  She has had hyperprolactinemia.  All of the current issues with the right rotator cuff, vitreal detachment, and retinal tear.  MEDICATIONS:  She reports that her current medications are: 1. Lotemax, left eye, 1 drop b.i.d. 2. Vitamin D 3 2000 units p.o. daily. 3. Vitamin C 500 mg p.o. 1 daily. 4. Vitamin B12 1 tablet daily. 5. Vicodin 1 tablet every 6 hours as needed.  Will have to hold that     since I am going to put her on a fentanyl patch. 6. Toprol XL 100 mg 1 p.o. daily. 7. Promethazine 25 mg 1 q.6h. p.r.n. 8. Omega 3 acid 1 gm p.o. daily. 9. Loestrin 24 one tablet in the a.m. 10.Imitrex injection 6 mg  subcu daily as needed. 11.Hydrochlorothiazide 25 mg 1 p.o. daily. 12.Ginkgo biloba 60 mg 1 tablet p.o. daily. 13.Eye Factors with Lutein 2 mg p.o. daily. 14.Depakote ER 1500 mg at bedtime. 15.Calcium carbonate 1 tablet p.o. daily. 16.Adderall 10 mg p.o. b.i.d. 17.Ambien 10 mg 1 p.o. at bedtime. 18.Multivitamins 1 tablet p.o. daily.  FAMILY HISTORY:  Significant in that her father had bipolar.  DRUG ALLERGIES: 1. PENICILLIN.  She gets hives. 2. PREDNISONE and PROVERA.  She becomes manic. 3. MORPHINE gives her severe nausea and vomiting. 4. She developed an allergy to CLEAR TAPE this summer.  MENTAL STATUS EXAM:  She is alert and oriented.  She is appropriately groomed, dressed and nourished.  She has been losing weight.  She reports a 93-pound weight loss.  She has been working with a Data processing manager at [employer].  Her speech is not pressured.  Her mood is anxiously depressed.  She has poor eye contact.  She is frustrated to the point where she would hurt herself, so she reports that she is in fact still potentially  suicidal.  She is not homicidal.  She is not having auditory or visual hallucinations.  Judgment and insight are fair.  Concentration and memory are intact.  Intelligence is at least average.  DIAGNOSES:  AXIS I:  Bipolar disorder, attention deficit disorder by her report. AXIS II:  Borderline personality disorder. AXIS III:  Migraine.  Currently she has issues with her right rotator cuff, a recent left vitreal hemorrhage and retinal tear. AXIS IV: Stressors relating to school, primary support group, burden of illness. AXIS V:  Her GAF currently is 55.  PLAN:  Admit for safety and stabilization, as she has not slept well in the past few days, acknowledges racing thoughts.  Will go ahead and give her Zyprexa Zydis 10 mg tonight, a 1-time dose.  She has not had a recent Depakote level.  Will check that.  Will give her a fentanyl patch 25 mcg per hour x72 hours to hopefully stop her migraine, which is still currently at a level 7.  ESTIMATED LENGTH OF STAY:  3-5 days.     Mickie Leonarda Salon, P.A.-C.   ______________________________ Franchot Gallo, MD    MD/MEDQ  D:  09/28/2010  T:  09/29/2010  Job:  [id number]  Electronically Signed by Jaci Lazier ADAMS P.A.-C. on 10/01/2010 12:06:41 PM Electronically Signed by Franchot Gallo MD on 10/03/2010 05:30:25 PM

## 2010-10-05 ENCOUNTER — Encounter: Payer: Self-pay | Admitting: Family Medicine

## 2010-10-05 ENCOUNTER — Ambulatory Visit (INDEPENDENT_AMBULATORY_CARE_PROVIDER_SITE_OTHER): Payer: PRIVATE HEALTH INSURANCE | Admitting: Family Medicine

## 2010-10-05 ENCOUNTER — Ambulatory Visit (HOSPITAL_COMMUNITY)
Admission: RE | Admit: 2010-10-05 | Discharge: 2010-10-05 | Disposition: A | Discharge status: Home / Self Care | Payer: PRIVATE HEALTH INSURANCE | Source: Ambulatory Visit | Attending: Family Medicine | Admitting: Family Medicine

## 2010-10-05 VITALS — BP 132/89 | HR 68 | Ht 70.0 in | Wt 217.4 lb

## 2010-10-05 DIAGNOSIS — M79609 Pain in unspecified limb: Secondary | ICD-10-CM

## 2010-10-05 DIAGNOSIS — M25511 Pain in right shoulder: Secondary | ICD-10-CM

## 2010-10-05 DIAGNOSIS — M79673 Pain in unspecified foot: Secondary | ICD-10-CM

## 2010-10-05 DIAGNOSIS — M25519 Pain in unspecified shoulder: Secondary | ICD-10-CM

## 2010-10-05 MED ORDER — MELOXICAM 15 MG PO TABS: 15.0000 mg | ORAL_TABLET | Freq: Every day | ORAL | Status: DC

## 2010-10-05 NOTE — Patient Instructions (Signed)
1. Get x-rays of your foot at [facility].  2. Take your mobic daily.  3. Continue with your range of motion exercises from physical therapy.  4. Follow up with this clinic in about 3 weeks.

## 2010-10-06 DIAGNOSIS — M79673 Pain in unspecified foot: Secondary | ICD-10-CM | POA: Insufficient documentation

## 2010-10-06 NOTE — Assessment & Plan Note (Signed)
Normal x ray.  Pain is likely due to walking barefoot for so long.  She has a history of pes planus.  She will try OTC inserts and stay out of flip flops for now.  Expect this will improve with proper foot wear.

## 2010-10-06 NOTE — Assessment & Plan Note (Signed)
The patient has failed injection therapy and physical therapy.  Will do one more trial of NSAIDs and ROM exercises.  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   Saving this discussion for her next visit as she is somewhat emotional after being discharged from mental health this am.

## 2010-10-06 NOTE — Progress Notes (Signed)
  Subjective:    Patient ID: [name], female    DOB: 1970/07/08, 40 y.o.   MRN: [id number]  HPI 40 y/o female is here to follow up for her right shoulder pain.  The pain is mostly associated with overhead motions.  She had an injection at the last visit which did not improve her symptoms.  She tried physical therapy in the past from May 9th until July 26th twice weekly without improvement.  She has tried vicodin and percocet also without relief.    Now she is also complaining of right foot pain, worse with walking x 2 weeks.  The pain worsened over her 1 week inpatient stay on the mental health ward where she did plenty of walking wearing only socks. There is no trauma to the foot that she can remember.     Review of Systems     Objective:   Physical Exam Shoulder: Inspection reveals no abnormalities, atrophy or asymmetry. Tenderness over the [facility] joint. No tenderness over the biceps tendon Abducts to 100 degrees (active) Forward flexion to 150 degrees (active) Externally rotates to 75 degrees (active) Passive ROM is full Rotator cuff strength is somewhat decreased throughout. Equivocal Hawkin's tests, empty can. Speeds and Yergason's tests normal. No labral pathology noted with negative Obrien's, negative clunk and good stability No apprehension   MRI 05/2010: Mild AC arthropathy, no rotator cuff or biceps tendon tear, type 2 acromion.  Right foot: Tenderness to palpation over the 2-4 metatarsals.  Pes planus       Assessment & Plan:

## 2010-10-07 NOTE — Discharge Summary (Signed)
NAMEMERRANDA, [name]               ACCOUNT NO.:  [id number]  MEDICAL RECORD NO.:  [id number]  LOCATION:  0508                          FACILITY:  [facility]  PHYSICIAN:  Franchot Gallo, MD     DATE OF BIRTH:  1970/11/18  DATE OF ADMISSION:  09/28/2010 DATE OF DISCHARGE:  10/05/2010                              DISCHARGE SUMMARY   REASON FOR ADMISSION:  This is a 40 year old female that presented under a lot of stress, feeling she had no support, feeling hopeless with a plan to run through an intersection in hopes to get hit by a truck.  She also currently has some medical issues that she is dealing with.  FINAL IMPRESSION:  Axis I:  Major depressive disorder recurrent, generalized anxiety disorder, attention deficit hyperactivity disorder inattentive type. Axis II:  Borderline personality disorder. Axis III:  Hypertension and migraine headaches. Axis V:  Global Assessment of Functioning at 65.  SIGNIFICANT FINDINGS:  The patient was admitted to the adult milieu for safety and stabilization.  She reported not sleeping well for several days.  We ordered Zyprexa Zydis at bedtime, one-time dose, and obtained a Depakote level.  We also initially ordered a fentanyl patch to help her with her migraine, which she rated at a 7.  She was reporting good sleep, decreased appetite, having severe depressive symptoms rating it an 8 on a scale of 1-10, having episodic suicidal thoughts but no plan or intent, rating her hopelessness an 8 on a scale of 1-10.  She was reporting auditory hallucinations, hearing music or a lawnmower.  We started Zoloft for depression and continued with the Zyprexa for mood stabilization.  We also repeated some further labs.  She was participating in groups.  She continued to endorse similar symptoms with moderate depressive symptoms and episodic suicidal thoughts.  We increased her Zoloft and discontinued her Zyprexa.  We had Ambien available for sleep and ordered  Neurontin for her anxiety and tremor. She was participating in groups.  She was reporting multiple complaints. She was unhappy with some staff members, feeling dissatisfied with her treatment.  She refused her Neurontin due to her side effects and continued with visual hallucinations, seeing a television on the ceiling.  She was reporting extreme agitation and anxiety and inability to concentrate.  We stopped her Neurontin at that time and tried Geodon. She slept well with the Geodon and reported good appetite, having mild depressive symptoms and episodic suicidal thoughts.  She was upset about some of the groups that she attended the day prior.  We increased her Zoloft to 150 mg to lessen her depressive symptoms.  She was feeling better.  Her sleep was good and states that she usually does not have a problem with sleep.  Her appetite, she was feeling less nauseated.  She was hoping to go back to school but realized she may have to drop one of her classes.  She talked about her mother having surgery on Friday and needed to be there for her.  On day of discharge, the patient was seen in the interdisciplinary treatment team.  Her mother was contacted for safety issues and for [place] to provide information.  Her  sleep was good, appetite was good, mild depressive symptoms, rating it a 2 on a scale of 1-10.  She adamantly denied any suicidal or homicidal thoughts or auditory or visual hallucinations.  Her anxiety was mild, rating it a 3 on a scale of 1-10, and denied any medication side effects.  DISCHARGE MEDICATIONS: 1. Zoloft 100 mg taking one and a half daily. 2. Geodon 60 mg daily. 3. Ambien 10 mg for sleep. 4. _Adderal_10 mg one b.i.d. 5. Calcium carbonate one daily. 6. Depakote ER 500 mg three at bedtime. 7. Lutein 2 mg eye drops. 8. Hydrochlorothiazide 25 mg every morning. 9. Imitrex as needed for headache. 10.Beta carotene. 11.Ocuvite for her eyes. 12.Loteprednol ophthalmic  solution as directed left eye b.i.d. 13.Multivitamin daily. 14.Omega-3 daily. 15.Promethazine 25 mg q.6 p.r.n. for nausea. 16.Toprol XL one tablet every morning. 17.Vitamin B12. 18.Vitamin C. 19.Vitamin D3.  DISCHARGE FOLLOW-UP:  With [name] at [phone].  She is to call her therapist for an appointment. [name], nurse practitioner, [phone], appointment time Thursday, September 13th at 9:00 a.m.     Landry Corporal, N.P.   ______________________________ Franchot Gallo, MD    JO/MEDQ  D:  10/06/2010  T:  10/06/2010  Job:  [id number]  Electronically Signed by Limmie PatriciaP. on 10/07/2010 09:26:31 AM Electronically Signed by Franchot Gallo MD on 10/07/2010 04:48:04 PM

## 2010-10-20 LAB — COMPREHENSIVE METABOLIC PANEL
ALT: 20
AST: 18
Albumin: 3.7
Alkaline Phosphatase: 80
BUN: 10
CO2: 28
Calcium: 9.3
Chloride: 107
Creatinine, Ser: 0.87
GFR calc Af Amer: >60
GFR calc non Af Amer: >60
Glucose, Bld: 108 — ABNORMAL HIGH
Potassium: 3.9
Sodium: 141
Total Bilirubin: 0.7
Total Protein: 5.7 — ABNORMAL LOW

## 2010-10-20 LAB — CBC
HCT: 38.5
Hemoglobin: 13.3
MCHC: 34.6
MCV: 88.4
Platelets: 264
RBC: 4.35
RDW: 12.5
WBC: 7.2

## 2010-10-20 LAB — URINE MICROSCOPIC-ADD ON

## 2010-10-20 LAB — URINALYSIS, ROUTINE W REFLEX MICROSCOPIC
Bilirubin Urine: NEGATIVE
Glucose, UA: NEGATIVE
Hgb urine dipstick: NEGATIVE
Ketones, ur: NEGATIVE
Nitrite: NEGATIVE
Protein, ur: NEGATIVE
Specific Gravity, Urine: 1.022
Urobilinogen, UA: 0.2
pH: 6

## 2010-10-20 LAB — VALPROIC ACID LEVEL: Valproic Acid Lvl: 10 — ABNORMAL LOW

## 2010-10-20 LAB — CARBAMAZEPINE LEVEL, TOTAL
Carbamazepine Lvl: 5.3
Carbamazepine Lvl: 7.4

## 2010-10-20 LAB — TSH: TSH: 2.347

## 2010-10-20 LAB — MAGNESIUM: Magnesium: 2

## 2010-10-21 LAB — COMPREHENSIVE METABOLIC PANEL
ALT: 25
AST: 18
Albumin: 3.7
Alkaline Phosphatase: 73
BUN: 11
CO2: 31
Calcium: 9.2
Chloride: 101
Creatinine, Ser: 0.88
GFR calc Af Amer: >60
GFR calc non Af Amer: >60
Glucose, Bld: 93
Potassium: 3.7
Sodium: 137
Total Bilirubin: 0.6
Total Protein: 6.1

## 2010-10-21 LAB — DIFFERENTIAL
Basophils Absolute: 0
Basophils Relative: 1
Eosinophils Absolute: 0.2
Eosinophils Relative: 4
Lymphocytes Relative: 41
Lymphs Abs: 2.1
Monocytes Absolute: 0.4
Monocytes Relative: 8
Neutro Abs: 2.4
Neutrophils Relative %: 46

## 2010-10-21 LAB — BASIC METABOLIC PANEL
BUN: 7
CO2: 30
Calcium: 9.2
Chloride: 103
Creatinine, Ser: 0.86
GFR calc Af Amer: >60
GFR calc non Af Amer: >60
Glucose, Bld: 99
Potassium: 3.9
Sodium: 141

## 2010-10-21 LAB — URINALYSIS, ROUTINE W REFLEX MICROSCOPIC
Bilirubin Urine: NEGATIVE
Glucose, UA: NEGATIVE
Hgb urine dipstick: NEGATIVE
Nitrite: NEGATIVE
Protein, ur: NEGATIVE
Specific Gravity, Urine: 1.023
Urobilinogen, UA: 0.2
pH: 6.5

## 2010-10-21 LAB — CBC
HCT: 38.3
Hemoglobin: 13.3
MCHC: 34.9
MCV: 88.4
Platelets: 250
RBC: 4.33
RDW: 12.8
WBC: 5.2

## 2010-10-21 LAB — POCT CARDIAC MARKERS
CKMB, poc: <1 — ABNORMAL LOW
Myoglobin, poc: 30
Operator id: 4295
Troponin i, poc: <0.05

## 2010-10-21 LAB — CARBAMAZEPINE LEVEL, TOTAL
Carbamazepine Lvl: 4.9
Carbamazepine Lvl: 6.7

## 2010-10-21 LAB — PROLACTIN: Prolactin: 91.4

## 2010-10-21 LAB — VALPROIC ACID LEVEL
Valproic Acid Lvl: 49.2 — ABNORMAL LOW
Valproic Acid Lvl: 61.6

## 2010-10-21 LAB — D-DIMER, QUANTITATIVE: D-Dimer, Quant: <0.22

## 2010-10-26 ENCOUNTER — Ambulatory Visit (INDEPENDENT_AMBULATORY_CARE_PROVIDER_SITE_OTHER): Payer: PRIVATE HEALTH INSURANCE | Admitting: Family Medicine

## 2010-10-26 ENCOUNTER — Encounter: Payer: Self-pay | Admitting: Family Medicine

## 2010-10-26 VITALS — BP 135/92 | HR 84 | Ht 70.0 in | Wt 213.8 lb

## 2010-10-26 DIAGNOSIS — M79609 Pain in unspecified limb: Secondary | ICD-10-CM

## 2010-10-26 DIAGNOSIS — M79673 Pain in unspecified foot: Secondary | ICD-10-CM

## 2010-10-26 MED ORDER — KETOPROFEN POWD: Status: DC

## 2010-11-04 LAB — I-STAT 8, (EC8 V) (CONVERTED LAB)
Acid-Base Excess: 3 — ABNORMAL HIGH
BUN: 8
Bicarbonate: 26 — ABNORMAL HIGH
Chloride: 107
Glucose, Bld: 88
HCT: 40
Hemoglobin: 13.6
Operator id: 288831
Potassium: 4.4
Sodium: 136
TCO2: 27
pCO2, Ven: 34 — ABNORMAL LOW
pH, Ven: 7.492 — ABNORMAL HIGH

## 2010-11-04 LAB — POCT CARDIAC MARKERS
CKMB, poc: <1 — ABNORMAL LOW
Myoglobin, poc: 44.9
Operator id: 288831
Troponin i, poc: <0.05

## 2010-11-04 LAB — POCT I-STAT CREATININE
Creatinine, Ser: 0.9
Operator id: 288831

## 2010-11-04 LAB — D-DIMER, QUANTITATIVE (NOT AT ARMC): D-Dimer, Quant: <0.22

## 2010-11-07 LAB — POCT CARDIAC MARKERS
CKMB, poc: <1 — ABNORMAL LOW
CKMB, poc: <1 — ABNORMAL LOW
Myoglobin, poc: 36.2
Myoglobin, poc: 42
Operator id: 4001
Operator id: 4661
Troponin i, poc: <0.05
Troponin i, poc: <0.05

## 2010-11-07 LAB — D-DIMER, QUANTITATIVE: D-Dimer, Quant: <0.22

## 2010-11-07 LAB — PROTIME-INR
INR: 1
Prothrombin Time: 13.7

## 2010-11-11 LAB — COMPREHENSIVE METABOLIC PANEL
ALT: 17
AST: 18
Albumin: 3.5
Alkaline Phosphatase: 80
BUN: 9
CO2: 29
Calcium: 9.1
Chloride: 107
Creatinine, Ser: 0.92
GFR calc Af Amer: >60
GFR calc non Af Amer: >60
Glucose, Bld: 96
Potassium: 3.6
Sodium: 139
Total Bilirubin: 0.5
Total Protein: 5.5 — ABNORMAL LOW

## 2010-11-11 LAB — CBC
HCT: 38.6
HCT: 46.8 — ABNORMAL HIGH
Hemoglobin: 13.1
Hemoglobin: 15.9 — ABNORMAL HIGH
MCHC: 34
MCHC: 34
MCV: 87.7
MCV: 88
Platelets: 260
Platelets: 306
RBC: 4.4
RBC: 5.32 — ABNORMAL HIGH
RDW: 13
RDW: 12.7
WBC: 7.7
WBC: 8.4

## 2010-11-11 LAB — I-STAT 8, (EC8 V) (CONVERTED LAB)
Acid-Base Excess: 2
BUN: 9
Bicarbonate: 29.2 — ABNORMAL HIGH
Chloride: 104
Glucose, Bld: 107 — ABNORMAL HIGH
HCT: 52 — ABNORMAL HIGH
Hemoglobin: 17.7 — ABNORMAL HIGH
Operator id: 234501
Potassium: 3.8
Sodium: 140
TCO2: 31
pCO2, Ven: 51 — ABNORMAL HIGH
pH, Ven: 7.366 — ABNORMAL HIGH

## 2010-11-11 LAB — DIFFERENTIAL
Basophils Absolute: 0.1
Basophils Relative: 1
Eosinophils Absolute: 0.1
Eosinophils Relative: 2
Lymphocytes Relative: 23
Lymphs Abs: 1.9
Monocytes Absolute: 0.5
Monocytes Relative: 6
Neutro Abs: 5.8
Neutrophils Relative %: 69

## 2010-11-11 LAB — POCT I-STAT CREATININE
Creatinine, Ser: 0.8
Operator id: 234501

## 2010-11-11 LAB — BASIC METABOLIC PANEL
BUN: 9
CO2: 29
Calcium: 9.5
Chloride: 105
Creatinine, Ser: 0.79
GFR calc Af Amer: >60
GFR calc non Af Amer: >60
Glucose, Bld: 97
Potassium: 3.4 — ABNORMAL LOW
Sodium: 143

## 2010-11-11 LAB — TSH: TSH: 1.123

## 2010-11-11 LAB — ETHANOL: Alcohol, Ethyl (B): <5

## 2010-11-11 LAB — RAPID URINE DRUG SCREEN, HOSP PERFORMED
Amphetamines: NOT DETECTED
Barbiturates: NOT DETECTED
Benzodiazepines: POSITIVE — AB
Cocaine: NOT DETECTED
Opiates: NOT DETECTED
Tetrahydrocannabinol: NOT DETECTED

## 2010-11-11 LAB — T4, FREE: Free T4: 1.22

## 2010-11-14 LAB — URINALYSIS, ROUTINE W REFLEX MICROSCOPIC
Bilirubin Urine: NEGATIVE
Glucose, UA: NEGATIVE
Hgb urine dipstick: NEGATIVE
Ketones, ur: NEGATIVE
Nitrite: NEGATIVE
Protein, ur: NEGATIVE
Specific Gravity, Urine: 1.011
Urobilinogen, UA: 0.2
pH: 7

## 2010-11-14 LAB — COMPREHENSIVE METABOLIC PANEL
ALT: 15
AST: 18
Albumin: 3.8
Alkaline Phosphatase: 74
BUN: 3 — ABNORMAL LOW
CO2: 28
Calcium: 9.5
Chloride: 104
Creatinine, Ser: 0.86
GFR calc Af Amer: >60
GFR calc non Af Amer: >60
Glucose, Bld: 100 — ABNORMAL HIGH
Potassium: 3.3 — ABNORMAL LOW
Sodium: 138
Total Bilirubin: 1
Total Protein: 6.2

## 2010-11-14 LAB — BASIC METABOLIC PANEL
BUN: 7
CO2: 27
Calcium: 9.2
Chloride: 109
Creatinine, Ser: 0.67
GFR calc Af Amer: >60
GFR calc non Af Amer: >60
Glucose, Bld: 99
Potassium: 3.3 — ABNORMAL LOW
Sodium: 141

## 2010-11-14 LAB — CBC
HCT: 40.6
Hemoglobin: 13.9
MCHC: 34.1
MCV: 87.4
Platelets: 303
RBC: 4.64
RDW: 13
WBC: 8.2

## 2010-11-14 LAB — VALPROIC ACID LEVEL: Valproic Acid Lvl: 70.3

## 2010-11-14 LAB — LITHIUM LEVEL
Lithium Lvl: 0.96
Lithium Lvl: 1.15

## 2010-11-14 NOTE — Assessment & Plan Note (Signed)
She has chronic changes in the great toe.  We will try topical ketaprofen.  If this doesn't improve the pain she will return with her old orthotics so that we can add a 1st ray support.

## 2010-11-14 NOTE — Progress Notes (Signed)
  Subjective:    Patient ID: [name], female    DOB: 1971-01-19, 40 y.o.   MRN: [id number]  HPI 40 y/o female is here to follow up for foot pain.  Most of the pain has resolved.  She continues to have pain of the great toe.  No new complaints.   Review of Systems     Objective:   Physical Exam  Foot Great toe with tenderness to palpation over the MTP  No tenderness to palpation over the metatarsals. No swelling, erythema, or deformity Skin warm, dry, intact  Ultrasound: Edema and a small calcification noted at the MTP.        Assessment & Plan:

## 2010-11-15 LAB — LITHIUM LEVEL: Lithium Lvl: 1.12

## 2010-11-16 LAB — CBC
HCT: 39.5
Hemoglobin: 13.4
MCHC: 34
MCV: 87.2
Platelets: 332
RBC: 4.53
RDW: 13.1
WBC: 9.1

## 2010-11-16 LAB — URINALYSIS, ROUTINE W REFLEX MICROSCOPIC
Bilirubin Urine: NEGATIVE
Glucose, UA: NEGATIVE
Hgb urine dipstick: NEGATIVE
Ketones, ur: NEGATIVE
Nitrite: NEGATIVE
Protein, ur: NEGATIVE
Specific Gravity, Urine: 1.01
Urobilinogen, UA: 0.2
pH: 7.5

## 2010-11-16 LAB — COMPREHENSIVE METABOLIC PANEL
ALT: 13
ALT: 13
AST: 16
AST: 16
Albumin: 3.6
Albumin: 3.6
Alkaline Phosphatase: 101
Alkaline Phosphatase: 83
BUN: 5 — ABNORMAL LOW
BUN: 7
CO2: 27
CO2: 29
Calcium: 9.5
Calcium: 9.7
Chloride: 102
Chloride: 103
Creatinine, Ser: 0.65
Creatinine, Ser: 0.81
GFR calc Af Amer: >60
GFR calc Af Amer: >60
GFR calc non Af Amer: >60
GFR calc non Af Amer: >60
Glucose, Bld: 102 — ABNORMAL HIGH
Glucose, Bld: 133 — ABNORMAL HIGH
Potassium: 3.4 — ABNORMAL LOW
Potassium: 3.9
Sodium: 137
Sodium: 139
Total Bilirubin: 0.6
Total Bilirubin: 0.9
Total Protein: 6
Total Protein: 6.4

## 2010-11-16 LAB — DRUGS OF ABUSE SCREEN W/O ALC, ROUTINE URINE
Amphetamine Screen, Ur: NEGATIVE
Barbiturate Quant, Ur: NEGATIVE
Benzodiazepines.: POSITIVE — AB
Cocaine Metabolites: NEGATIVE
Creatinine,U: 48.7
Marijuana Metabolite: NEGATIVE
Methadone: NEGATIVE
Opiate Screen, Urine: NEGATIVE
Phencyclidine (PCP): NEGATIVE
Propoxyphene: NEGATIVE

## 2010-11-16 LAB — LITHIUM LEVEL
Lithium Lvl: 0.59 — ABNORMAL LOW
Lithium Lvl: 0.72 — ABNORMAL LOW

## 2010-11-16 LAB — PREGNANCY, URINE: Preg Test, Ur: NEGATIVE

## 2010-11-16 LAB — TSH: TSH: 1.239

## 2010-11-16 LAB — BENZODIAZEPINE, QUANTITATIVE, URINE
Alprazolam (GC/LC/MS), ur confirm: 54 ng/mL
Flurazepam GC/MS Conf: NEGATIVE
Nordiazepam GC/MS Conf: NEGATIVE
Oxazepam GC/MS Conf: NEGATIVE

## 2010-11-16 LAB — VALPROIC ACID LEVEL: Valproic Acid Lvl: 69.9

## 2010-11-17 LAB — LITHIUM LEVEL: Lithium Lvl: 0.93

## 2010-11-18 ENCOUNTER — Ambulatory Visit: Payer: PRIVATE HEALTH INSURANCE | Admitting: Family Medicine

## 2010-11-28 ENCOUNTER — Encounter: Payer: Self-pay | Admitting: Family Medicine

## 2010-11-28 ENCOUNTER — Ambulatory Visit (INDEPENDENT_AMBULATORY_CARE_PROVIDER_SITE_OTHER): Payer: PRIVATE HEALTH INSURANCE | Admitting: Family Medicine

## 2010-11-28 VITALS — BP 124/86 | HR 66 | Temp 97.8°F | Ht 70.0 in | Wt 219.0 lb

## 2010-11-28 DIAGNOSIS — M25511 Pain in right shoulder: Secondary | ICD-10-CM

## 2010-11-28 DIAGNOSIS — M25519 Pain in unspecified shoulder: Secondary | ICD-10-CM

## 2010-11-28 NOTE — Assessment & Plan Note (Signed)
2/2 rotator cuff impingement.  Compared to my exam of patient ~5 months ago she does have better motion and full strength now.  However, continues to have pain especially at night.  Will try repeat cortisone injection today, encouraged to continue home exercise program.  Icing, nsaids, relative rest from painful activities.  If not improving over next couple weeks as expected, will consider referral to ortho.  After informed written consent, patient was seated on exam table. Right shoulder was prepped with alcohol swab and utilizing posterior approach, patient's right shoulder was injected with 6:2 marcaine:depomedrol with half in the subacromial space and half in glenohumeral space.  Patient tolerated the procedure well without immediate complications.

## 2010-11-28 NOTE — Progress Notes (Signed)
Subjective:    Patient ID: [name], female    DOB: 1970/09/06, 40 y.o.   MRN: [id number]  Shoulder Pain   Shoulder Injury    40 yo F here for f/u right shoulder pain.   Patient was initially here on 4/30 after suffering injury to right shoulder on 4/28 when in backyard digging a garden. While doing so when pushing shovel down, felt a pop with immediate pain within right shoulder. No swelling or bruising. Tried to continue but could only do so if sitting and shovel was held with right arm at her side. + night pain Unable to get arm above head actively now No prior shoulder injuries Is right handed Has also developed pain into right side of neck No bowel/bladder dysfunction No numbness or tingling radiating into arm since injury. MRI showed no rotator cuff tears or biceps tendon tear. She was placed in PT and had been improving - reported at OV a week ago she was 20% improved though she states since then has had a lot more improvement.  Then on 5/27 while at [facility] she slipped on a wet floor and fell directly onto right knee and hand. Pain worsened in right shoulder and intensified during the day Unable to sleep at night due to pain Shoulder feels more stiff Knee has improved since then. Went to urgent care and advised to follow-up here. Using sling as needed, icing and aleve as needed.  Has continued to struggle with right shoulder pain since the initial injury. Completed > 2 months of physical therapy and regained most of motion but pain has persisted. Had subacromial injection on 8/3 that helped some but relief was not long lasting. + night pain, wakes up when rolls onto this shoulder. Has tried vicodin, percocet for pain. Discussion at last visit about possibly seeing surgeon to address T2 acromion in future.  Past Medical History  Diagnosis Date  . Bipolar disorder   . Eczema   . Pes planus   . Achilles tendinitis   . Hypertension   . Migraine   .  Achilles tendinitis   . Ganglion cyst 09/29/2009    left wrist (2 cyst)  . Arthritis   . Allergy   . Lipoma     Current Outpatient Prescriptions on File Prior to Visit  Medication Sig Dispense Refill  . amphetamine-dextroamphetamine (ADDERALL) 20 MG tablet Take 10 mg by mouth 2 (two) times daily.       . divalproex (DEPAKOTE) 500 MG EC tablet Take 1,500 mg by mouth at bedtime.       . hydrochlorothiazide 25 MG tablet Take 25 mg by mouth daily.        Marland Kitchen HYDROcodone-acetaminophen (VICODIN) 5-500 MG per tablet Take 1 tablet by mouth every 6 (six) hours as needed for pain.  50 tablet  0  . Ketoprofen POWD Ketoprofen gel 20% aaa 1-2 grms tid  60 g  1  . meloxicam (MOBIC) 15 MG tablet Take 1 tablet (15 mg total) by mouth daily.  30 tablet  2  . metoprolol (TOPROL-XL) 100 MG 24 hr tablet Take 100 mg by mouth daily.        . Norethin Ace-Eth Estrad-FE (LOESTRIN FE 1/20 PO) Take 1 tablet by mouth daily.        . SUMAtriptan Succinate (IMITREX IJ) Inject 1 Syringe as directed as needed.        . zolpidem (AMBIEN) 10 MG tablet Take 10 mg by mouth at bedtime as  needed.          Past Surgical History  Procedure Date  . Tumor resection left thigh   . Ankle surgery 12/88    left   . Lipoma removal   . Ganglion cyst excision 2011  . Chest nodule 1990?    rt chest wall nodule removal    Allergies  Allergen Reactions  . Adhesive (Tape) Itching and Rash    Also reacted to Steri Strips and Band-Aids.  . Morphine   . Penicillins   . Prednisone Hives  . Provera (Medroxyprogesterone Acetate) Other (See Comments)    Causes manic episodes    History   Social History  . Marital Status: Single    Spouse Name: N/A    Number of Children: N/A  . Years of Education: N/A   Occupational History  . Not on file.   Social History Main Topics  . Smoking status: Never Smoker   . Smokeless tobacco: Never Used  . Alcohol Use: No  . Drug Use: No  . Sexually Active: Not on file   Other Topics  Concern  . Not on file   Social History Narrative  . No narrative on file    Family History  Problem Relation Age of Onset  . Hypertension Mother   . Hyperlipidemia Mother   . Heart attack Father   . Heart disease Father   . Diabetes Paternal Grandfather     BP 124/86  Pulse 66  Temp(Src) 97.8 F (36.6 C) (Oral)  Ht 5\' 10"  (1.778 m)  Wt 219 lb (99.338 kg)  BMI 31.42 kg/m2  Review of Systems  See HPI above.    Objective:   Physical Exam  Gen: NAD R shoulder: No gross deformity, swelling or bruising. Mod TTP bicipital groove.  No other focal TTP AC joint, other bony TTP. AROM 150 degrees abduction, 170 degrees flexion.  Full ER. Strength 5/5 with empty can, resisted IR/ER. + empty can.  Equivocal hawkins.  Negative neers. Negative apprehension. NVI distally  L shoulder: FROM without pain, swelling, weakness.     Assessment & Plan:  1. R shoulder pain - 2/2 rotator cuff impingement.  Compared to my exam of patient ~5 months ago she does have better motion and full strength now.  However, continues to have pain especially at night.  Will try repeat cortisone injection today, encouraged to continue home exercise program.  Icing, nsaids, relative rest from painful activities.  If not improving over next couple weeks as expected, will consider referral to ortho.  After informed written consent, patient was seated on exam table. Right shoulder was prepped with alcohol swab and utilizing posterior approach, patient's right shoulder was injected with 6:2 marcaine:depomedrol with half in the subacromial space and half in glenohumeral space.  Patient tolerated the procedure well without immediate complications.

## 2010-12-12 ENCOUNTER — Ambulatory Visit (INDEPENDENT_AMBULATORY_CARE_PROVIDER_SITE_OTHER): Payer: PRIVATE HEALTH INSURANCE | Admitting: Family Medicine

## 2010-12-12 ENCOUNTER — Encounter: Payer: Self-pay | Admitting: Family Medicine

## 2010-12-12 DIAGNOSIS — M79673 Pain in unspecified foot: Secondary | ICD-10-CM

## 2010-12-12 DIAGNOSIS — M79609 Pain in unspecified limb: Secondary | ICD-10-CM

## 2010-12-13 NOTE — Progress Notes (Signed)
  Subjective:    Patient ID: [name], female    DOB: [birth year], 40 y.o.   MRN: [id number]  HPI Continued right great toe pain. Diagnosed with some arthritis at the joint at last office visit. Conservative treatment has not been helping much. 10 out of 10 pain at times when she's walking. 5 at 10 pain which he seated. Has not noted any redness or warmth of the joint. No specific injury. No prior history of toe problems and no history of surgery on toe.   Review of Systems    denies fever, weight loss it is unusual, chills. Objective:   Physical Exam  Vital signs reviewed. GENERAL: Well developed, well nourished, no acute distress TOE: Right great toe tender to palpation at the MTP joint. No redness, no warmth noted at the joint. Full range of motion. Pain increased with motion. Ultrasound images from last office visit reviewed which shows some arthritic change and small osteophyte at the MTP joint. Small effusion.      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 Offered options and she chose immobilization in a postop shoe for 2 weeks and then will return to clinic. Would consider joint injection if that is not resolving.

## 2011-01-02 ENCOUNTER — Encounter: Payer: Self-pay | Admitting: Family Medicine

## 2011-01-02 ENCOUNTER — Ambulatory Visit (INDEPENDENT_AMBULATORY_CARE_PROVIDER_SITE_OTHER): Payer: PRIVATE HEALTH INSURANCE | Admitting: Family Medicine

## 2011-01-02 VITALS — BP 106/73 | HR 69

## 2011-01-02 DIAGNOSIS — M19079 Primary osteoarthritis, unspecified ankle and foot: Secondary | ICD-10-CM

## 2011-01-04 NOTE — Progress Notes (Signed)
  Subjective:    Patient ID: [name], female    DOB: 07-Oct-1970, 40 y.o.   MRN: [id number]  HPI  Continued left great toe pain despite wearing the post op shoe as rx. Wants an injection into the joint---it is not improving and she is tired of "messing with it". She has some type of shoulder surgery coming up in a montthh and she wants this issue resolved. Pain in joint, worse with walking or movement. No rest pain.  Review of Systems no redneSs of toes, no fever, no sweats or chills    Objective:   Physical Exam  Vital signs reviewed. GENERAL: Well developed, well nourished, no acute distress LEFT great toe tto MTP joint. ROM is full. No hallux rigidus. No redness. No skin lesions Ultrasound images from previous ov reviewed---moderate arthritic change MTp joint.  INJECTION: Patient was given informed consent, signed copy in the chart. Appropriate time out was taken. Area prepped and draped in usual sterile fashion. 1/4 cc of methylprednisolone 40 mg/ml plus  1/2 cc of 1% lidocaine without epinephrine was injected into the left 1st mtp using a(n) perpendicular dorsal medial approach. The patient tolerated the procedure well. There were no complications. Post procedure instructions were given.       Assessment & Plan:  DJD MTP #1 left Injection with Corticosteroid. Recommend post op shoe for another 24 hours. I do not think this small amount of steroid would interfere in any way with any [place] surgey she may have in next 3-4 weeks.

## 2011-01-12 ENCOUNTER — Ambulatory Visit (HOSPITAL_BASED_OUTPATIENT_CLINIC_OR_DEPARTMENT_OTHER)
Admission: RE | Admit: 2011-01-12 | Discharge: 2011-01-12 | Disposition: A | Discharge status: Home / Self Care | Payer: PRIVATE HEALTH INSURANCE | Source: Ambulatory Visit | Attending: Family Medicine | Admitting: Family Medicine

## 2011-01-12 ENCOUNTER — Ambulatory Visit (INDEPENDENT_AMBULATORY_CARE_PROVIDER_SITE_OTHER): Payer: PRIVATE HEALTH INSURANCE | Admitting: Family Medicine

## 2011-01-12 ENCOUNTER — Encounter: Payer: Self-pay | Admitting: Family Medicine

## 2011-01-12 VITALS — BP 114/75 | HR 66 | Temp 97.7°F | Ht 70.0 in | Wt 230.0 lb

## 2011-01-12 DIAGNOSIS — S90129A Contusion of unspecified lesser toe(s) without damage to nail, initial encounter: Secondary | ICD-10-CM

## 2011-01-12 DIAGNOSIS — M79609 Pain in unspecified limb: Secondary | ICD-10-CM | POA: Insufficient documentation

## 2011-01-12 DIAGNOSIS — M7989 Other specified soft tissue disorders: Secondary | ICD-10-CM

## 2011-01-12 DIAGNOSIS — IMO0002 Reserved for concepts with insufficient information to code with codable children: Secondary | ICD-10-CM

## 2011-01-12 DIAGNOSIS — M79671 Pain in right foot: Secondary | ICD-10-CM

## 2011-01-12 DIAGNOSIS — M79673 Pain in unspecified foot: Secondary | ICD-10-CM

## 2011-01-12 DIAGNOSIS — S99919A Unspecified injury of unspecified ankle, initial encounter: Secondary | ICD-10-CM

## 2011-01-12 DIAGNOSIS — S99929A Unspecified injury of unspecified foot, initial encounter: Secondary | ICD-10-CM

## 2011-01-12 DIAGNOSIS — S8990XA Unspecified injury of unspecified lower leg, initial encounter: Secondary | ICD-10-CM

## 2011-01-12 NOTE — Assessment & Plan Note (Signed)
radiographs negative for a fracture.  Likely toe/foot sprain.  Discussed icing, tylenol (can restart NSAIDs after surgery), postop shoe, buddy taping, relative rest.  Would not repeat cortisone injection.  F/u prn.

## 2011-01-12 NOTE — Progress Notes (Signed)
Subjective:    Patient ID: [name], female    DOB: 1970/05/14, 40 y.o.   MRN: [id number]  HPI 40 yo F here for right foot injury.  Patient reports on 12/7 she went to kick what she thought was a stack of papers and kicked something hard underneath it. Had worsening pain right great toe (around same area had prior injection on 12/3) as well as bruising and swelling here up to medial dorsal foot. Has not taken any nsaids because has surgery tomorrow on her shoulder. Had been using postop shoe but not doing this any longer.  Past Medical History  Diagnosis Date  . Bipolar disorder   . Eczema   . Pes planus   . Achilles tendinitis   . Hypertension   . Migraine   . Achilles tendinitis   . Ganglion cyst 09/29/2009    left wrist (2 cyst)  . Arthritis   . Allergy   . Lipoma     Current Outpatient Prescriptions on File Prior to Visit  Medication Sig Dispense Refill  . divalproex (DEPAKOTE) 500 MG EC tablet Take 1,500 mg by mouth at bedtime.       . hydrochlorothiazide 25 MG tablet Take 25 mg by mouth daily.        Marland Kitchen HYDROcodone-acetaminophen (VICODIN) 5-500 MG per tablet Take 1 tablet by mouth every 6 (six) hours as needed for pain.  50 tablet  0  . Ketoprofen POWD Ketoprofen gel 20% aaa 1-2 grms tid  60 g  1  . metoprolol (TOPROL-XL) 100 MG 24 hr tablet Take 100 mg by mouth daily.        Marland Kitchen NAPROXEN PO Take 1 tablet by mouth 2 (two) times daily as needed.        . risperiDONE (RISPERDAL) 1 MG tablet Take 1.5 mg by mouth at bedtime.        . SUMAtriptan Succinate (IMITREX IJ) Inject 1 Syringe as directed as needed.        . zolpidem (AMBIEN) 10 MG tablet Take 10 mg by mouth at bedtime as needed.          Past Surgical History  Procedure Date  . Tumor resection left thigh   . Ankle surgery 12/88    left   . Lipoma removal   . Ganglion cyst excision 2011  . Chest nodule 1990?    rt chest wall nodule removal    Allergies  Allergen Reactions  . Adhesive (Tape)  Itching and Rash    Also reacted to Steri Strips and Band-Aids.  . Morphine   . Penicillins   . Prednisone Hives  . Provera (Medroxyprogesterone Acetate) Other (See Comments)    Causes manic episodes    History   Social History  . Marital Status: Single    Spouse Name: N/A    Number of Children: N/A  . Years of Education: N/A   Occupational History  . Not on file.   Social History Main Topics  . Smoking status: Never Smoker   . Smokeless tobacco: Never Used  . Alcohol Use: No  . Drug Use: No  . Sexually Active: Not on file   Other Topics Concern  . Not on file   Social History Narrative  . No narrative on file    Family History  Problem Relation Age of Onset  . Hypertension Mother   . Hyperlipidemia Mother   . Heart attack Father   . Heart disease Father   . Diabetes  Paternal Grandfather     BP 114/75  Pulse 66  Temp(Src) 97.7 F (36.5 C) (Oral)  Ht 5\' 10"  (1.778 m)  Wt 230 lb (104.327 kg)  BMI 33.00 kg/m2   Review of Systems See HPI.    Objective:   Physical Exam Gen: NAD  R foot: Mild swelling and bruising proximal phalanx, MTP 1st digit.  No other deformity, erythema. Mild TTP 1st MTP, proximal phalanx, distal 1st MT.  No other TTP about foot or ankle. Mildly limited dorsiflexion 1st MTP.  Otherwise FROM ankle, digits. Strength 5/5 all motions of ankle. 2+ dp pulse. NVI distally     Assessment & Plan:  1. Right foot injury - radiographs negative for a fracture.  Likely toe/foot sprain.  Discussed icing, tylenol (can restart NSAIDs after surgery), postop shoe, buddy taping, relative rest.  Would not repeat cortisone injection.  F/u prn.

## 2011-01-13 HISTORY — PX: SHOULDER SURGERY: SHX246

## 2011-03-21 ENCOUNTER — Ambulatory Visit
Admission: RE | Admit: 2011-03-21 | Discharge: 2011-03-21 | Disposition: A | Discharge status: Home / Self Care | Payer: PRIVATE HEALTH INSURANCE | Source: Ambulatory Visit | Attending: Rehabilitation | Admitting: Rehabilitation

## 2011-03-21 ENCOUNTER — Other Ambulatory Visit: Payer: Self-pay | Admitting: Rehabilitation

## 2011-03-21 DIAGNOSIS — M542 Cervicalgia: Secondary | ICD-10-CM

## 2011-03-21 MED ORDER — GADOBENATE DIMEGLUMINE 529 MG/ML IV SOLN
20.0000 mL | Freq: Once | INTRAVENOUS | Status: AC | PRN
  Administered 2011-03-21: 20 mL via INTRAVENOUS

## 2011-03-22 ENCOUNTER — Emergency Department (HOSPITAL_COMMUNITY)
Admission: EM | Admit: 2011-03-22 | Discharge: 2011-03-22 | Disposition: A | Discharge status: Home Health | Payer: PRIVATE HEALTH INSURANCE | Attending: Emergency Medicine | Admitting: Emergency Medicine

## 2011-03-22 ENCOUNTER — Encounter (HOSPITAL_COMMUNITY): Payer: Self-pay | Admitting: Emergency Medicine

## 2011-03-22 ENCOUNTER — Other Ambulatory Visit: Payer: Self-pay

## 2011-03-22 ENCOUNTER — Emergency Department (HOSPITAL_COMMUNITY): Payer: PRIVATE HEALTH INSURANCE

## 2011-03-22 DIAGNOSIS — I10 Essential (primary) hypertension: Secondary | ICD-10-CM | POA: Insufficient documentation

## 2011-03-22 DIAGNOSIS — M129 Arthropathy, unspecified: Secondary | ICD-10-CM | POA: Insufficient documentation

## 2011-03-22 DIAGNOSIS — R079 Chest pain, unspecified: Secondary | ICD-10-CM | POA: Insufficient documentation

## 2011-03-22 DIAGNOSIS — M542 Cervicalgia: Secondary | ICD-10-CM | POA: Insufficient documentation

## 2011-03-22 DIAGNOSIS — Z79899 Other long term (current) drug therapy: Secondary | ICD-10-CM | POA: Insufficient documentation

## 2011-03-22 DIAGNOSIS — F319 Bipolar disorder, unspecified: Secondary | ICD-10-CM | POA: Insufficient documentation

## 2011-03-22 LAB — DIFFERENTIAL
Basophils Absolute: 0 10*3/uL (ref 0.0–0.1)
Basophils Relative: 0 % (ref 0–1)
Eosinophils Absolute: 0 10*3/uL (ref 0.0–0.7)
Eosinophils Relative: 0 % (ref 0–5)
Lymphocytes Relative: 22 % (ref 12–46)
Lymphs Abs: 1.4 10*3/uL (ref 0.7–4.0)
Monocytes Absolute: 0.3 10*3/uL (ref 0.1–1.0)
Monocytes Relative: 5 % (ref 3–12)
Neutro Abs: 4.6 10*3/uL (ref 1.7–7.7)
Neutrophils Relative %: 72 % (ref 43–77)

## 2011-03-22 LAB — COMPREHENSIVE METABOLIC PANEL
ALT: 11 U/L (ref 0–35)
AST: 15 U/L (ref 0–37)
Albumin: 3.7 g/dL (ref 3.5–5.2)
Alkaline Phosphatase: 52 U/L (ref 39–117)
BUN: 10 mg/dL (ref 6–23)
CO2: 31 mEq/L (ref 19–32)
Calcium: 9.8 mg/dL (ref 8.4–10.5)
Chloride: 98 mEq/L (ref 96–112)
Creatinine, Ser: 0.89 mg/dL (ref 0.50–1.10)
GFR calc Af Amer: >90 mL/min (ref >90)
GFR calc non Af Amer: 79 mL/min — ABNORMAL LOW (ref >90)
Glucose, Bld: 109 mg/dL — ABNORMAL HIGH (ref 70–99)
Potassium: 4 mEq/L (ref 3.5–5.1)
Sodium: 137 mEq/L (ref 135–145)
Total Bilirubin: 0.6 mg/dL (ref 0.3–1.2)
Total Protein: 6.1 g/dL (ref 6.0–8.3)

## 2011-03-22 LAB — LIPASE, BLOOD: Lipase: 17 U/L (ref 11–59)

## 2011-03-22 LAB — CBC
HCT: 39.7 % (ref 36.0–46.0)
Hemoglobin: 13.7 g/dL (ref 12.0–15.0)
MCH: 31.3 pg (ref 26.0–34.0)
MCHC: 34.5 g/dL (ref 30.0–36.0)
MCV: 90.6 fL (ref 78.0–100.0)
Platelets: 227 10*3/uL (ref 150–400)
RBC: 4.38 MIL/uL (ref 3.87–5.11)
RDW: 12.1 % (ref 11.5–15.5)
WBC: 6.3 10*3/uL (ref 4.0–10.5)

## 2011-03-22 LAB — TROPONIN I: Troponin I: <0.3 ng/mL (ref <0.30)

## 2011-03-22 MED ORDER — DIAZEPAM 5 MG PO TABS
5.0000 mg | ORAL_TABLET | Freq: Once | ORAL | Status: AC
  Administered 2011-03-22: 5 mg via ORAL
  Filled 2011-03-22: qty 1

## 2011-03-22 MED ORDER — IBUPROFEN 800 MG PO TABS: 800.0000 mg | ORAL_TABLET | Freq: Three times a day (TID) | ORAL | Status: AC

## 2011-03-22 NOTE — ED Notes (Signed)
Pt states she has history of costocondritis, but this chest pain is different, pt describes cp as constant and intense beginning today. No nausea, vomiting, shortness of breath, or diaphoresis. Pt states she has a migraine.

## 2011-03-22 NOTE — Discharge Instructions (Signed)
Chest Pain (Nonspecific) It is often hard to give a specific diagnosis for the cause of chest pain. There is always a chance that your pain could be related to something serious, such as a heart attack or a blood clot in the lungs. You need to follow up with your caregiver for further evaluation. CAUSES   Heartburn.   Pneumonia or bronchitis.   Anxiety and stress.   Inflammation around your heart (pericarditis) or lung (pleuritis or pleurisy).   A blood clot in the lung.   A collapsed lung (pneumothorax). It can develop suddenly on its own (spontaneous pneumothorax) or from injury (trauma) to the chest.  The chest wall is composed of bones, muscles, and cartilage. Any of these can be the source of the pain.  The bones can be bruised by injury.   The muscles or cartilage can be strained by coughing or overwork.   The cartilage can be affected by inflammation and become sore (costochondritis).  DIAGNOSIS  Lab tests or other studies, such as X-rays, an EKG, stress testing, or cardiac imaging, may be needed to find the cause of your pain.  TREATMENT   Treatment depends on what may be causing your chest pain. Treatment may include:   Acid blockers for heartburn.   Anti-inflammatory medicine.   Pain medicine for inflammatory conditions.   Antibiotics if an infection is present.   You may be advised to change lifestyle habits. This includes stopping smoking and avoiding caffeine and chocolate.   You may be advised to keep your head raised (elevated) when sleeping. This reduces the chance of acid going backward from your stomach into your esophagus.   Most of the time, nonspecific chest pain will improve within 2 to 3 days with rest and mild pain medicine.  HOME CARE INSTRUCTIONS   If antibiotics were prescribed, take the full amount even if you start to feel better.   For the next few days, avoid physical activities that bring on chest pain. Continue physical activities as  directed.   Do not smoke cigarettes or drink alcohol until your symptoms are gone.   Only take over-the-counter or prescription medicine for pain, discomfort, or fever as directed by your caregiver.   Follow your caregiver's suggestions for further testing if your chest pain does not go away.   Keep any follow-up appointments you made. If you do not go to an appointment, you could develop lasting (chronic) problems with pain. If there is any problem keeping an appointment, you must call to reschedule.  SEEK MEDICAL CARE IF:   You think you are having problems from the medicine you are taking. Read your medicine instructions carefully.   Your chest pain does not go away, even after treatment.   You develop a rash with blisters on your chest.  SEEK IMMEDIATE MEDICAL CARE IF:   You have increased chest pain or pain that spreads to your arm, neck, jaw, back, or belly (abdomen).   You develop shortness of breath, an increasing cough, or you are coughing up blood.   You have severe back or abdominal pain, feel sick to your stomach (nauseous) or throw up (vomit).   You develop severe weakness, fainting, or chills.   You have an oral temperature above 102 F (38.9 C), not controlled by medicine.  THIS IS AN EMERGENCY. Do not wait to see if the pain will go away. Get medical help at once. Call your local emergency services (911 in [place] Do not drive yourself to   the hospital. MAKE SURE YOU:   Understand these instructions.   Will watch your condition.   Will get help right away if you are not doing well or get worse.  Document Released: 10/26/2004 Document Revised: 09/28/2010 Document Reviewed: 08/22/2007 [employer] Patient Information 2012 [employer]. 

## 2011-03-22 NOTE — ED Provider Notes (Signed)
History     CSN: [id number]  Arrival date & time 03/22/11  1612   First MD Initiated Contact with Patient 03/22/11 1656      Chief Complaint  Patient presents with  . Chest Pain    intermit lt side chest pain worse today non radating, migraine headache has hx of this but states it is worse than normal, no sob, states that her bp has been up and down, feels worse today than normal.     HPI This 41 year old female presents with chest pain.  She notes that she always has diffuse pain, and typically has been focally about the left side of her sternum today, approximately 12 hours ago, her pain began more prominent more laterally.  The pain is throbbing, nonradiating.  The patient has not attempted any relief with OTC medications.  She denies any dyspnea, fevers, chills, cough.  No clear alleviating or exacerbating factors. Past Medical History  Diagnosis Date  . Bipolar disorder   . Eczema   . Pes planus   . Achilles tendinitis   . Hypertension   . Migraine   . Achilles tendinitis   . Ganglion cyst 09/29/2009    left wrist (2 cyst)  . Arthritis   . Allergy   . Lipoma     Past Surgical History  Procedure Date  . Tumor resection left thigh   . Ankle surgery 12/88    left   . Lipoma removal   . Ganglion cyst excision 2011  . Chest nodule 1990?    rt chest wall nodule removal    Family History  Problem Relation Age of Onset  . Hypertension Mother   . Hyperlipidemia Mother   . Heart attack Father   . Heart disease Father   . Diabetes Paternal Grandfather     History  Substance Use Topics  . Smoking status: Never Smoker   . Smokeless tobacco: Never Used  . Alcohol Use: No    OB History    Grav Para Term Preterm Abortions TAB SAB Ect Mult Living                  Review of Systems  Constitutional:       HPI  HENT:       HPI otherwise negative  Eyes: Negative.   Respiratory:       HPI, otherwise negative  Cardiovascular:       HPI, otherwise nmegative    Gastrointestinal: Negative for vomiting.  Genitourinary:       HPI, otherwise negative  Musculoskeletal:       HPI, otherwise negative  Skin: Negative.   Neurological: Negative for syncope.    Allergies  Adhesive; Morphine; Penicillins; Prednisone; and Provera  Home Medications   Current Outpatient Rx  Name Route Sig Dispense Refill  . DIVALPROEX SODIUM 500 MG PO TBEC Oral Take 1,500 mg by mouth at bedtime.     Marland Kitchen HYDROCHLOROTHIAZIDE 25 MG PO TABS Oral Take 25 mg by mouth daily.      Marland Kitchen HYDROXYZINE PAMOATE 50 MG PO CAPS Oral Take 50 mg by mouth 3 (three) times daily as needed. For anxiety    . METOPROLOL SUCCINATE ER 100 MG PO TB24 Oral Take 100 mg by mouth daily.      . ADULT MULTIVITAMIN W/MINERALS CH Oral Take 1 tablet by mouth daily.    Willette Brace IJ Injection Inject 6 mg as directed as needed. For migraine pain    . ZOLPIDEM TARTRATE 10  MG PO TABS Oral Take 10 mg by mouth at bedtime as needed. For insomnia    . IBUPROFEN 800 MG PO TABS Oral Take 1 tablet (800 mg total) by mouth 3 (three) times daily. 12 tablet 0    BP 126/80  Pulse 53  Temp(Src) 98.9 F (37.2 C) (Oral)  Resp 14  SpO2 97%  LMP 03/15/2011  Physical Exam  Nursing note and vitals reviewed. Constitutional: She is oriented to person, place, and time. She appears well-developed and well-nourished. No distress.  HENT:  Head: Normocephalic and atraumatic.  Eyes: Conjunctivae and EOM are normal.  Cardiovascular: Normal rate and regular rhythm.   Pulmonary/Chest: Effort normal and breath sounds normal. No stridor. No respiratory distress.  Abdominal: She exhibits no distension.  Musculoskeletal: She exhibits no edema.  Neurological: She is alert and oriented to person, place, and time. No cranial nerve deficit.  Skin: Skin is warm and dry.  Psychiatric: Her speech is normal and behavior is normal. Thought content normal.       Patient notes prior suicidal ideation, though none currently, nor any plan.    ED  Course  Procedures (including critical care time)  Labs Reviewed  COMPREHENSIVE METABOLIC PANEL - Abnormal; Notable for the following:    Glucose, Bld 109 (*)    GFR calc non Af Amer 79 (*)    All other components within normal limits  CBC  DIFFERENTIAL  LIPASE, BLOOD  TROPONIN I   Dg Chest 2 View  03/22/2011  *RADIOLOGY REPORT*  Clinical Data: Chest pain.  CHEST - 2 VIEW  Comparison: 09/16/2010.  Findings: Normal sized heart.  Clear lungs with normal vascularity. Unremarkable bones.  IMPRESSION: Normal examination.  Original Report Authenticated By: Darrol Angel, M.D.   Mr Angiogram Neck W Wo Contrast  03/22/2011  *RADIOLOGY REPORT*  Clinical Data:  Neck pain  BUN and creatinine were obtained on site at [facility] at [address] Results:  BUN 7 mg/dL,  Creatinine 0.9 mg/dL.  MRA NECK WITHOUT AND WITH CONTRAST  Technique:  Angiographic images of the neck were obtained using MRA technique without and with intravenous contrast.  Carotid stenosis measurements (when applicable) are obtained utilizing NASCET criteria, using the distal internal carotid diameter as the denominator.  Contrast: 20mL MULTIHANCE GADOBENATE DIMEGLUMINE 529 MG/ML IV SOLN  Comparison:   None.  Findings:  Left carotid artery has a common origin with the innominate artery which is a normal variation.  Left vertebral artery has origin from the aortic arch which is a normal variation.  Carotid arteries  are widely patent bilaterally without evidence of atherosclerotic disease or stenosis.  Bifurcation is widely patent bilaterally.  No evidence of carotid dissection.  Both vertebral arteries are widely patent without stenosis.  Both vertebral arteries are patent to the basilar.  No evidence of stenosis or dissection.  IMPRESSION: Normal MRA neck.  No evidence of carotid or vertebral stenosis or dissection.  Original Report Authenticated By: Camelia Phenes, M.D.     1. Chest pain    CXR reviewed by me   Date:  03/22/2011  Rate: 61  Rhythm: normal sinus rhythm  QRS Axis: normal  Intervals: normal  ST/T Wave abnormalities: normal  Conduction Disutrbances:none  Narrative Interpretation:   Old EKG Reviewed: none available NORMAL ECG  Pulse ox 100% ra- normal  Cardiac 61-sr-normal    MDM  This well-appearing 41 year old female with chronic pain, including chronic chest pain now presents with pain is characteristically slightly  different.  On exam she is in no distress, with unremarkable vital signs.  The patient's labs are unremarkable as is her ECG and chest x-ray.  Given the patient's denial of attempts at pain relief, the absence of distress, she is appropriate for oral analgesics, primary care physician followup.  She was discharged in stable condition.        Gerhard Munch, MD 03/22/11 [phone]

## 2011-03-23 ENCOUNTER — Other Ambulatory Visit: Payer: PRIVATE HEALTH INSURANCE

## 2011-03-24 ENCOUNTER — Ambulatory Visit (INDEPENDENT_AMBULATORY_CARE_PROVIDER_SITE_OTHER): Payer: PRIVATE HEALTH INSURANCE | Admitting: Family Medicine

## 2011-03-24 VITALS — BP 128/84

## 2011-03-24 DIAGNOSIS — M7741 Metatarsalgia, right foot: Secondary | ICD-10-CM

## 2011-03-24 DIAGNOSIS — M7750 Other enthesopathy of unspecified foot: Secondary | ICD-10-CM

## 2011-03-24 NOTE — Progress Notes (Signed)
  Subjective:    Patient ID: [name], female    DOB: 07-03-1970, 41 y.o.   MRN: [id number]  HPI  Continued problems with right great toe joint pain and right forefoot pain. 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 Feels like the bones in her forefoot are impacting milligram more than he should and this is painful. We previously measure some orthotics but she has lost.  Review of Systems Denies any erythema or warmth of the right foot. Has noted no skin rash of the right foot.    Objective:   Physical Exam  Vital signs reviewed. GENERAL: Well developed, well nourished, no acute distress :FOOT Right: No erythema or warmth. Skin is without rash. Dorsalis pedis pulses 2+. Right MTP joint mildly tender to palpation and movement of the first phalanx is slightly painful. She has beginning of loss of the transverse arch. Mild tenderness to palpation over the second and third metatarsal head.      Assessment & Plan:  #1. 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

## 2011-04-06 ENCOUNTER — Telehealth: Payer: Self-pay | Admitting: *Deleted

## 2011-04-06 NOTE — Telephone Encounter (Signed)
Please tell patient to come in fasting the morning of her appointment and we'll draw the appropriate labs at that time.

## 2011-04-06 NOTE — Telephone Encounter (Signed)
Pt has annual scheduled on Monday 11 at 2:00pm,  Pt would like to come that morning to have labs drawn so she will not have to fast all day long. Okay to have labs done earlier? Please advise

## 2011-04-10 ENCOUNTER — Ambulatory Visit (INDEPENDENT_AMBULATORY_CARE_PROVIDER_SITE_OTHER): Payer: PRIVATE HEALTH INSURANCE | Admitting: Family Medicine

## 2011-04-10 ENCOUNTER — Encounter: Payer: PRIVATE HEALTH INSURANCE | Admitting: Gynecology

## 2011-04-10 VITALS — BP 120/80

## 2011-04-10 DIAGNOSIS — M79673 Pain in unspecified foot: Secondary | ICD-10-CM

## 2011-04-10 DIAGNOSIS — M79609 Pain in unspecified limb: Secondary | ICD-10-CM

## 2011-04-10 NOTE — Telephone Encounter (Signed)
Pt canceled appointment for annual on 3/11. Left message on pt vm, this was okay with JF.

## 2011-04-13 ENCOUNTER — Encounter: Payer: Self-pay | Admitting: Family Medicine

## 2011-04-13 NOTE — Progress Notes (Signed)
  Subjective:    Patient ID: [name], female    DOB: 02/25/1970, 41 y.o.   MRN: [id number]  HPI  #1. Followup mid foot pain on the right foot.. We had placed her in an arc strap. She is 80% better. #2. Continues to have great toe joint pain but it is no worse. Right foot.  Review of Systems Denies redness or warmth of the foot. No swelling.    Objective:   Physical Exam   Vital signs reviewed. GENERAL: Well developed, well nourished, no acute distress Right foot: No erythema or warmth. No swelling. Mild tenderness to palpation over the dorsum of the midfoot but no specific location. The right great toe has full range of flexion and extension. Mild tenderness to palpation.      Assessment & Plan:  #1. Right first MTP arthritis. Advised her to use good walking shoes. #2. Right mid foot pain seems improved with the arc strap. She will continue. Followup when necessary.

## 2011-04-18 ENCOUNTER — Ambulatory Visit (INDEPENDENT_AMBULATORY_CARE_PROVIDER_SITE_OTHER): Payer: PRIVATE HEALTH INSURANCE | Admitting: Sports Medicine

## 2011-04-18 ENCOUNTER — Encounter: Payer: Self-pay | Admitting: Sports Medicine

## 2011-04-18 VITALS — BP 127/92 | HR 67

## 2011-04-18 DIAGNOSIS — M25511 Pain in right shoulder: Secondary | ICD-10-CM

## 2011-04-18 DIAGNOSIS — M25519 Pain in unspecified shoulder: Secondary | ICD-10-CM

## 2011-04-18 NOTE — Progress Notes (Addendum)
  Subjective:    Patient ID: [name], female    DOB: 26-Nov-1970, 41 y.o.   MRN: [id number]  HPI [name] comes in with complaints of right shoulder pain on and off for approximately a year. She is actually been seeing [facility] for rotator cuff pathology, and has recently had a shoulder arthroscopy that was only positive for some partial tearing. No rotator cuff repair performed. She localizes the pain starting from her neck, and running down the lateral aspect of her right upper extremity, down to her right lateral forearm and into all of the fingers.  He notes that she's already had formal therapy for her neck, has also had a prednisone, and has in fact had an MRI several years ago, however her symptoms were not pronounced into her arm and hand as they are now.   Review of Systems    No fevers, chills, night sweats, weight loss, chest pain, or shortness of breath.  Social History: Non-smoker. Objective:   Physical Exam General:  Well developed, well nourished, and in no acute distress. Neuro:  Alert and oriented x3, extra-ocular muscles intact. Skin: Warm and dry, no rashes noted. Respiratory:  Not using accessory muscles, speaking in full sentences. Musculoskeletal: Neck: Inspection unremarkable. No palpable stepoffs. Negative Spurling's maneuver. Full neck range of motion Grip strength and sensation normal in bilateral hands Strength good C4 to T1 distribution No sensory change to C4 to T1 Reflexes show a suppressed right brachioradialis reflex.     Assessment & Plan:

## 2011-04-18 NOTE — Patient Instructions (Addendum)
You have been scheduled for an appointment for MRI of your neck at [address] [facility] on 04/20/11 at 8:15pm.   [phone]

## 2011-04-18 NOTE — Assessment & Plan Note (Signed)
With symptoms radiating down arm in a C5/6 distribution, suspect cervical radicular symptoms. Also with depressed BR reflex. Has failed medications and neck PT. MRI c-spine. RTC to review results together, I think we will end up with selective cervical nerve root blocks.

## 2011-04-19 ENCOUNTER — Encounter: Payer: Self-pay | Admitting: *Deleted

## 2011-04-19 NOTE — Progress Notes (Signed)
Patient ID: [name], female   DOB: Jul 28, 1970, 41 y.o.   MRN: [id number]   Per Ozella Almond at [facility]- no precert is needed for MRI.  Called for precert 04/19/11, Christy at [facility] notified that no precert was required.

## 2011-04-20 ENCOUNTER — Ambulatory Visit
Admission: RE | Admit: 2011-04-20 | Discharge: 2011-04-20 | Disposition: A | Discharge status: Home / Self Care | Payer: PRIVATE HEALTH INSURANCE | Source: Ambulatory Visit | Attending: Sports Medicine | Admitting: Sports Medicine

## 2011-04-20 DIAGNOSIS — M25511 Pain in right shoulder: Secondary | ICD-10-CM

## 2011-04-25 ENCOUNTER — Ambulatory Visit (INDEPENDENT_AMBULATORY_CARE_PROVIDER_SITE_OTHER): Payer: PRIVATE HEALTH INSURANCE | Admitting: Sports Medicine

## 2011-04-25 VITALS — BP 123/83

## 2011-04-25 DIAGNOSIS — M25519 Pain in unspecified shoulder: Secondary | ICD-10-CM

## 2011-04-25 DIAGNOSIS — M25511 Pain in right shoulder: Secondary | ICD-10-CM

## 2011-04-25 MED ORDER — GABAPENTIN 300 MG PO CAPS: ORAL_CAPSULE | ORAL | Status: DC

## 2011-04-25 NOTE — Assessment & Plan Note (Addendum)
Suspect right C5/6 radiculopathy. MRI with C5/6 and C6/7 disc protrusions. Will send to neurology to confirm/debunk with EMG/NCS. F/u with me after to discuss results. Will start neurontin up-taper.  We also did discuss the possibility of reinjecting her shoulder at her next visit if no better with Neurontin, and if nerve conduction studies were negative.

## 2011-04-25 NOTE — Patient Instructions (Signed)
F/u with me after your EMG/NCS.

## 2011-04-25 NOTE — Progress Notes (Signed)
  Subjective:    Patient ID: [name], female    DOB: Jul 09, 1970, 41 y.o.   MRN: [id number]  HPI [name] comes back to review MRI results. Just to recap she's had right arm numbness and tingling that radiates from her neck, and a C5 and C6 distribution. She has already failed oral steroids, as well as neck physical therapy. 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  Review of Systems    No fevers, chills, night sweats, weight loss, chest pain, or shortness of breath.  Social History: Non-smoker. Objective:   Physical Exam General:  Well developed, well nourished, and in no acute distress. Neuro:  Alert and oriented x3, extra-ocular muscles intact. Skin: Warm and dry, no rashes noted. Respiratory:  Not using accessory muscles, speaking in full sentences.  Her cervical spine MRI showed some disc protrusions that were mild at the C5-6 level as well as the C6-7 level. She also had some uncinate spurring, however none of these appear to cause any lateral recess, or neuroforaminal stenosis. On my personal review of the MRI it does appear as though the C5-6 disc protrusion does deform the thecal sac anteriorly slightly.    Assessment & Plan:

## 2011-05-02 ENCOUNTER — Ambulatory Visit (INDEPENDENT_AMBULATORY_CARE_PROVIDER_SITE_OTHER): Payer: PRIVATE HEALTH INSURANCE | Admitting: Sports Medicine

## 2011-05-02 ENCOUNTER — Encounter: Payer: Self-pay | Admitting: Sports Medicine

## 2011-05-02 VITALS — BP 121/84 | HR 66

## 2011-05-02 DIAGNOSIS — M25519 Pain in unspecified shoulder: Secondary | ICD-10-CM

## 2011-05-02 DIAGNOSIS — M25511 Pain in right shoulder: Secondary | ICD-10-CM

## 2011-05-02 NOTE — Progress Notes (Signed)
  Subjective:    Patient ID: [name], female    DOB: 1970-12-16, 41 y.o.   MRN: [id number]  HPI Delania comes back for followup of her right shoulder pain. To recap she's already had arthroscopic surgery which was minimally beneficial, she's had a cervical spine MRI which showed multilevel degenerative changes mostly at the C5-6 level which did appear to indenting the thecal sac, but did not cause any lateral recess or foraminal stenosis. She recently had nerve conduction studies that were negative.  Her pain is localized over the lateral shoulder and does go down her arm sometimes into her fingers.   Review of Systems    No fevers, chills, night sweats, weight loss, chest pain, or shortness of breath.  Social History: Non-smoker. Objective:   Physical Exam General:  Well developed, well nourished, and in no acute distress. Neuro:  Alert and oriented x3, extra-ocular muscles intact. Skin: Warm and dry, no rashes noted. Respiratory:  Not using accessory muscles, speaking in full sentences. Musculoskeletal: Cuff is weak, she does have some impingement signs.  Real-time Ultrasound Guided Injection of: right subacromial bursa Ultrasound guided injection is preferred based studies that show increased duration, increased effect, greater accuracy, decreased procedural pain, increased response rate, and decreased cost with ultrasound guided versus blind injection. Consent obtained. Time-out conducted. Noted no overlying erythema, induration, or other signs of local infection. Skin prepped in a sterile fashion. Local anesthesia: Topical Ethyl chloride. With sterile technique and under real time ultrasound guidance: needle inserted into the bursa, bursa seen distending after injection of 1 cc Depo-Medrol 40 and 4 cc lidocaine. Completed without difficulty Pain immediately resolved suggesting accurate placement of the medication. Advised to call if fevers/chills, erythema, induration,  drainage, or persistent bleeding. Images saved.     Assessment & Plan:

## 2011-05-02 NOTE — Progress Notes (Signed)
Addended by: Monica Becton on: 05/02/2011 04:29 PM   Modules accepted: Orders

## 2011-05-02 NOTE — Assessment & Plan Note (Addendum)
Although she does have an MRI degenerative disc disease, the lack of lateral recess or foraminal stenosis as well as a negative nerve conduction study make radiculopathy unlikely.  At this point we will pursue the rotator cuff as the pain generating structure. Injection as above. Continue cuff exercises. She will come back to see [place] in 3-4 weeks, and if no better I would refer her back for shoulder arthroscopy.  Of note, neurologist gave samples of Gabapentin ER.

## 2011-05-10 ENCOUNTER — Ambulatory Visit: Payer: PRIVATE HEALTH INSURANCE | Admitting: Sports Medicine

## 2011-05-11 ENCOUNTER — Ambulatory Visit (INDEPENDENT_AMBULATORY_CARE_PROVIDER_SITE_OTHER): Payer: PRIVATE HEALTH INSURANCE | Admitting: Family Medicine

## 2011-05-11 ENCOUNTER — Encounter: Payer: Self-pay | Admitting: Family Medicine

## 2011-05-11 VITALS — BP 108/75 | HR 69 | Temp 97.8°F | Ht 70.0 in | Wt 228.0 lb

## 2011-05-11 DIAGNOSIS — M25511 Pain in right shoulder: Secondary | ICD-10-CM

## 2011-05-11 DIAGNOSIS — M25519 Pain in unspecified shoulder: Secondary | ICD-10-CM

## 2011-05-11 MED ORDER — NITROGLYCERIN 0.2 MG/HR TD PT24: MEDICATED_PATCH | TRANSDERMAL | Status: DC

## 2011-05-12 ENCOUNTER — Encounter: Payer: Self-pay | Admitting: Family Medicine

## 2011-05-12 NOTE — Assessment & Plan Note (Signed)
her exam and history still suggests continued rotator cuff impingement despite arthroscopy, injections, prior PT.  We discussed there's still a chance this goes away with time, HEP, and PT.  Finances are an issue and she's worried about continuing to go to PT with this being the case.  She has not tried nitro patches for this and is willing to do so.  I share her frustrations and am less optimistic about her long term prospects for recovery than I was at prior visits given length of her symptoms and everything she's tried to no avail.  Advised to try nitro patches for 6 weeks, continue HEP, PT would be up to her, f/u in about 6 weeks here or in [place] office.

## 2011-05-12 NOTE — Progress Notes (Signed)
Subjective:    Patient ID: [name], female    DOB: 1970-10-21, 41 y.o.   MRN: [id number]  Shoulder Pain   41 yo F here for f/u right shoulder  4/2: [name] comes back for followup of her right shoulder pain. To recap she's already had arthroscopic surgery which was minimally beneficial, she's had a cervical spine MRI which showed multilevel degenerative changes mostly at the C5-6 level which did appear to indenting the thecal sac, but did not cause any lateral recess or foraminal stenosis. She recently had nerve conduction studies that were negative.  Her pain is localized over the lateral shoulder and does go down her arm sometimes into her fingers.  4/11: Patient reports her right shoulder pain continues. Only mild improvement from injection given 1 1/2 weeks ago. She went back to see Dr. Eulah Pont who did her arthroscopy, DCE, debridement, acromioplasty who recommended she do longer course of physical therapy and no surgery likely to be beneficial at this point. She has gone back to PT for one visit but is frustrated about her continued issues, uncertainty if shoulder will ever get better. She gets full motion of the shoulder but pain with overhead activities. Pain is anterior and in upper arm. + night pain. Some clicking at times.  Past Medical History  Diagnosis Date  . Bipolar disorder   . Eczema   . Pes planus   . Achilles tendinitis   . Hypertension   . Migraine   . Achilles tendinitis   . Ganglion cyst 09/29/2009    left wrist (2 cyst)  . Arthritis   . Allergy   . Lipoma   . ADHD (attention deficit hyperactivity disorder)   . Hyperprolactinemia     Current Outpatient Prescriptions on File Prior to Visit  Medication Sig Dispense Refill  . amphetamine-dextroamphetamine (ADDERALL) 5 MG tablet Take 5 mg by mouth 2 (two) times daily.      . divalproex (DEPAKOTE) 500 MG EC tablet Take 1,000 mg by mouth at bedtime.       . Gabapentin Enacarbil 600 MG TB24 Take 1  tablet by mouth Nightly.      . hydrochlorothiazide 25 MG tablet Take 25 mg by mouth daily.        . hydrOXYzine (VISTARIL) 50 MG capsule Take 50 mg by mouth 3 (three) times daily as needed. For anxiety      . metoprolol (TOPROL-XL) 100 MG 24 hr tablet Take 100 mg by mouth daily.        . Multiple Vitamin (MULITIVITAMIN WITH MINERALS) TABS Take 1 tablet by mouth daily.      . nitroGLYCERIN (NITRODUR - DOSED IN MG/24 HR) 0.2 mg/hr Apply 1/4 patch to affected area and change daily for rotator cuff  30 patch  1  . SUMAtriptan Succinate (IMITREX IJ) Inject 6 mg as directed as needed. For migraine pain      . zolpidem (AMBIEN) 10 MG tablet Take 10 mg by mouth at bedtime as needed. For insomnia        Past Surgical History  Procedure Date  . Tumor resection left thigh   . Ankle surgery 12/88    left   . Lipoma removal   . Ganglion cyst excision 2011  . Chest nodule 1990?    rt chest wall nodule removal    Allergies  Allergen Reactions  . Adhesive (Tape) Itching and Rash    Also reacted to Steri Strips and Band-Aids.  . Dilaudid (Hydromorphone Hcl)   .  Morphine   . Penicillins   . Percocet (Oxycodone-Acetaminophen)   . Prednisone Hives  . Provera (Medroxyprogesterone Acetate) Other (See Comments)    Causes manic episodes  . Ultram (Tramadol Hcl)     History   Social History  . Marital Status: Single    Spouse Name: N/A    Number of Children: N/A  . Years of Education: N/A   Occupational History  . Not on file.   Social History Main Topics  . Smoking status: Never Smoker   . Smokeless tobacco: Never Used  . Alcohol Use: No  . Drug Use: No  . Sexually Active: Yes    Birth Control/ Protection: Pill   Other Topics Concern  . Not on file   Social History Narrative  . No narrative on file    Family History  Problem Relation Age of Onset  . Hypertension Mother   . Hyperlipidemia Mother   . Heart attack Father   . Heart disease Father   . Hypertension Father   .  Diabetes Paternal Grandfather   . Heart disease Maternal Aunt   . Breast cancer Maternal Aunt   . Cancer Maternal Grandmother     colon  . Heart disease Maternal Grandmother     BP 108/75  Pulse 69  Temp(Src) 97.8 F (36.6 C) (Oral)  Ht 5\' 10"  (1.778 m)  Wt 228 lb (103.42 kg)  BMI 32.71 kg/m2  Review of Systems See HPI.     Objective:   Physical Exam  Gen: NAD  R shoulder: No swelling, ecchymoses.  No gross deformity. Mild TTP lateral pectoralis, anterior shoulder including biceps tendon. FROM with painful arc. Negative Hawkins, positive Neers. Negative Speeds, Yergasons. Strength 5/5 with empty can and resisted internal/external rotation but pain with int rotation and empty can. Negative apprehension. NV intact distally.     Assessment & Plan:  1. Right shoulder pain - her exam and history still suggests continued rotator cuff impingement despite arthroscopy, injections, prior PT.  We discussed there's still a chance this goes away with time, HEP, and PT.  Finances are an issue and she's worried about continuing to go to PT with this being the case.  She has not tried nitro patches for this and is willing to do so.  I share her frustrations and am less optimistic about her long term prospects for recovery than I was at prior visits given length of her symptoms and everything she's tried to no avail.  Advised to try nitro patches for 6 weeks, continue HEP, PT would be up to her, f/u in about 6 weeks here or in [place] office.

## 2011-05-16 ENCOUNTER — Ambulatory Visit (INDEPENDENT_AMBULATORY_CARE_PROVIDER_SITE_OTHER): Payer: PRIVATE HEALTH INSURANCE | Admitting: Gynecology

## 2011-05-16 ENCOUNTER — Encounter: Payer: Self-pay | Admitting: Gynecology

## 2011-05-16 ENCOUNTER — Encounter (HOSPITAL_COMMUNITY): Payer: Self-pay | Admitting: *Deleted

## 2011-05-16 ENCOUNTER — Emergency Department (HOSPITAL_COMMUNITY)
Admission: EM | Admit: 2011-05-16 | Discharge: 2011-05-16 | Disposition: A | Discharge status: Home / Self Care | Payer: PRIVATE HEALTH INSURANCE | Attending: Emergency Medicine | Admitting: Emergency Medicine

## 2011-05-16 ENCOUNTER — Emergency Department (HOSPITAL_COMMUNITY): Payer: PRIVATE HEALTH INSURANCE

## 2011-05-16 VITALS — BP 124/88 | Ht 69.0 in | Wt 228.0 lb

## 2011-05-16 DIAGNOSIS — N949 Unspecified condition associated with female genital organs and menstrual cycle: Secondary | ICD-10-CM | POA: Insufficient documentation

## 2011-05-16 DIAGNOSIS — R635 Abnormal weight gain: Secondary | ICD-10-CM

## 2011-05-16 DIAGNOSIS — R102 Pelvic and perineal pain unspecified side: Secondary | ICD-10-CM

## 2011-05-16 DIAGNOSIS — Z01419 Encounter for gynecological examination (general) (routine) without abnormal findings: Secondary | ICD-10-CM

## 2011-05-16 DIAGNOSIS — I10 Essential (primary) hypertension: Secondary | ICD-10-CM | POA: Insufficient documentation

## 2011-05-16 DIAGNOSIS — Z79899 Other long term (current) drug therapy: Secondary | ICD-10-CM | POA: Insufficient documentation

## 2011-05-16 LAB — DIFFERENTIAL
Basophils Absolute: 0 10*3/uL (ref 0.0–0.1)
Basophils Relative: 1 % (ref 0–1)
Eosinophils Absolute: 0.1 10*3/uL (ref 0.0–0.7)
Eosinophils Relative: 1 % (ref 0–5)
Lymphocytes Relative: 41 % (ref 12–46)
Lymphs Abs: 2.8 10*3/uL (ref 0.7–4.0)
Monocytes Absolute: 0.6 10*3/uL (ref 0.1–1.0)
Monocytes Relative: 8 % (ref 3–12)
Neutro Abs: 3.4 10*3/uL (ref 1.7–7.7)
Neutrophils Relative %: 49 % (ref 43–77)

## 2011-05-16 LAB — COMPREHENSIVE METABOLIC PANEL
ALT: 12 U/L (ref 0–35)
AST: 20 U/L (ref 0–37)
Albumin: 4.2 g/dL (ref 3.5–5.2)
Alkaline Phosphatase: 65 U/L (ref 39–117)
BUN: 6 mg/dL (ref 6–23)
CO2: 29 mEq/L (ref 19–32)
Calcium: 9.6 mg/dL (ref 8.4–10.5)
Chloride: 103 mEq/L (ref 96–112)
Creatinine, Ser: 0.81 mg/dL (ref 0.50–1.10)
GFR calc Af Amer: >90 mL/min (ref >90)
GFR calc non Af Amer: 89 mL/min — ABNORMAL LOW (ref >90)
Glucose, Bld: 104 mg/dL — ABNORMAL HIGH (ref 70–99)
Potassium: 3.7 mEq/L (ref 3.5–5.1)
Sodium: 141 mEq/L (ref 135–145)
Total Bilirubin: 0.7 mg/dL (ref 0.3–1.2)
Total Protein: 6.7 g/dL (ref 6.0–8.3)

## 2011-05-16 LAB — CBC
HCT: 40.9 % (ref 36.0–46.0)
Hemoglobin: 14.2 g/dL (ref 12.0–15.0)
MCH: 31.2 pg (ref 26.0–34.0)
MCHC: 34.7 g/dL (ref 30.0–36.0)
MCV: 89.9 fL (ref 78.0–100.0)
Platelets: 230 10*3/uL (ref 150–400)
RBC: 4.55 MIL/uL (ref 3.87–5.11)
RDW: 11.8 % (ref 11.5–15.5)
WBC: 7 10*3/uL (ref 4.0–10.5)

## 2011-05-16 LAB — URINALYSIS, ROUTINE W REFLEX MICROSCOPIC
Bilirubin Urine: NEGATIVE
Glucose, UA: NEGATIVE mg/dL
Hgb urine dipstick: NEGATIVE
Ketones, ur: NEGATIVE mg/dL
Leukocytes, UA: NEGATIVE
Nitrite: NEGATIVE
Protein, ur: NEGATIVE mg/dL
Specific Gravity, Urine: 1.009 (ref 1.005–1.030)
Urobilinogen, UA: 0.2 mg/dL (ref 0.0–1.0)
pH: 6 (ref 5.0–8.0)

## 2011-05-16 LAB — POCT PREGNANCY, URINE: Preg Test, Ur: NEGATIVE

## 2011-05-16 MED ORDER — ONDANSETRON HCL 4 MG/2ML IJ SOLN
4.0000 mg | Freq: Once | INTRAMUSCULAR | Status: AC
  Administered 2011-05-16: 4 mg via INTRAVENOUS
  Filled 2011-05-16: qty 2

## 2011-05-16 MED ORDER — HYDROCODONE-ACETAMINOPHEN 5-325 MG PO TABS
2.0000 | ORAL_TABLET | Freq: Once | ORAL | Status: AC
  Administered 2011-05-16: 2 via ORAL
  Filled 2011-05-16: qty 2

## 2011-05-16 MED ORDER — SODIUM CHLORIDE 0.9 % IV BOLUS (SEPSIS)
1000.0000 mL | Freq: Once | INTRAVENOUS | Status: AC
  Administered 2011-05-16: 1000 mL via INTRAVENOUS

## 2011-05-16 MED ORDER — NAPROXEN 500 MG PO TABS: 500.0000 mg | ORAL_TABLET | Freq: Two times a day (BID) | ORAL | Status: DC

## 2011-05-16 MED ORDER — KETOROLAC TROMETHAMINE 30 MG/ML IJ SOLN
30.0000 mg | Freq: Once | INTRAMUSCULAR | Status: AC
  Administered 2011-05-16: 30 mg via INTRAVENOUS
  Filled 2011-05-16: qty 1

## 2011-05-16 MED ORDER — HYDROCODONE-ACETAMINOPHEN 5-500 MG PO TABS: 1.0000 | ORAL_TABLET | Freq: Four times a day (QID) | ORAL | Status: AC | PRN

## 2011-05-16 NOTE — Discharge Instructions (Signed)
Abdominal Pain, Women       Abdominal (stomach, pelvic, or belly) pain can be caused by many things. It is important to tell your doctor:   The location of the pain.   Does it come and go or is it present all the time?   Are there things that start the pain (eating certain foods, exercise)?   Are there other symptoms associated with the pain (fever, nausea, vomiting, diarrhea)?  All of this is helpful to know when trying to find the cause of the pain.   CAUSES   Stomach: virus or bacteria infection, or ulcer.   Intestine: appendicitis (inflamed appendix), regional ileitis (Crohn's disease), ulcerative colitis (inflamed colon), irritable bowel syndrome, diverticulitis (inflamed diverticulum of the colon), or cancer of the stomach or intestine.   Gallbladder disease or stones in the gallbladder.   Kidney disease, kidney stones, or infection.   Pancreas infection or cancer.   Fibromyalgia (pain disorder).   Diseases of the female organs:   Uterus: fibroid (non-cancerous) tumors or infection.   Fallopian tubes: infection or tubal pregnancy.   Ovary: cysts or tumors.   Pelvic adhesions (scar tissue).   Endometriosis (uterus lining tissue growing in the pelvis and on the pelvic organs).   Pelvic congestion syndrome (female organs filling up with blood just before the menstrual period).   Pain with the menstrual period.   Pain with ovulation (producing an egg).   Pain with an IUD (intrauterine device, birth control) in the uterus.   Cancer of the female organs.   Functional pain (pain not caused by a disease, may improve without treatment).   Psychological pain.   Depression.  DIAGNOSIS   Your doctor will decide the seriousness of your pain by doing an examination.   Blood tests.   X-rays.   Ultrasound.   CT scan (computed tomography, special type of X-ray).   MRI (magnetic resonance imaging).   Cultures, for infection.   Barium enema (dye inserted in the large intestine, to better view it with X-rays).   Colonoscopy  (looking in intestine with a lighted tube).   Laparoscopy (minor surgery, looking in abdomen with a lighted tube).   Major abdominal exploratory surgery (looking in abdomen with a large incision).  TREATMENT   The treatment will depend on the cause of the pain.   Many cases can be observed and treated at home.   Over-the-counter medicines recommended by your caregiver.   Prescription medicine.   Antibiotics, for infection.   Birth control pills, for painful periods or for ovulation pain.   Hormone treatment, for endometriosis.   Nerve blocking injections.   Physical therapy.   Antidepressants.   Counseling with a psychologist or psychiatrist.   Minor or major surgery.  HOME CARE INSTRUCTIONS   Do not take laxatives, unless directed by your caregiver.   Take over-the-counter pain medicine only if ordered by your caregiver. Do not take aspirin because it can cause an upset stomach or bleeding.   Try a clear liquid diet (broth or water) as ordered by your caregiver. Slowly move to a bland diet, as tolerated, if the pain is related to the stomach or intestine.   Have a thermometer and take your temperature several times a day, and record it.   Bed rest and sleep, if it helps the pain.   Avoid sexual intercourse, if it causes pain.   Avoid stressful situations.   Keep your follow-up appointments and tests, as your caregiver orders.   If   try:   Acupuncture.   Relaxation exercises (yoga, meditation).   Group therapy.   Counseling.  SEEK MEDICAL CARE IF:   You notice certain foods cause stomach pain.   Your home care treatment is not helping your pain.   You need stronger pain medicine.   You want your IUD removed.   You feel faint or lightheaded.   You develop nausea and vomiting.   You develop a rash.   You are having side effects or  an allergy to your medicine.  SEEK IMMEDIATE MEDICAL CARE IF:   Your pain does not go away or gets worse.   You have a fever.   Your pain is felt only in portions of the abdomen. The right side could possibly be appendicitis. The left lower portion of the abdomen could be colitis or diverticulitis.   You are passing blood in your stools (bright red or black tarry stools, with or without vomiting).   You have blood in your urine.   You develop chills, with or without a fever.   You pass out.  MAKE SURE YOU:   Understand these instructions.   Will watch your condition.   Will get help right away if you are not doing well or get worse.  Document Released: 11/13/2006 Document Revised: 01/05/2011 Document Reviewed: 12/03/2008 [facility] Patient Information 2012 [place], [place].  We don't always identify the cause of pain. Follow up with your OB/GYN for further evaluation and treatment.

## 2011-05-16 NOTE — Progress Notes (Signed)
[name] 10-24-1970 [id number]   History:    41 y.o.  for annual exam with complaint of several weeks of suprapubic discomfort. She denied any dysuria or frequency or back pain. She had been on Junel 1/20 oral contraceptive pill and she can discontinue that several months ago for no reason and states her cycles are now regular. Patient's currently being evaluated by her psychiatrist please see problem list for detail diagnosis. Patient not sexually active. Her last mammogram was normal July 2012. Review of her record again she was weighing 244 is down to 228. She in frequently does her self breast examination.  Past medical history,surgical history, family history and social history were all reviewed and documented in the EPIC chart.  Gynecologic History Patient's last menstrual period was 05/08/2011. Contraception: none Last Pap: 2012. Results were: normal Last mammogram: 2012. Results were: normal  Obstetric History OB History    Grav Para Term Preterm Abortions TAB SAB Ect Mult Living                   ROS:  Was performed and pertinent positives and negatives are included in the history.  Exam: chaperone present  BP 124/88  Ht 5\' 9"  (1.753 m)  Wt 228 lb (103.42 kg)  BMI 33.67 kg/m2  LMP 05/08/2011  Body mass index is 33.67 kg/(m^2).  General appearance : Well developed well nourished female. No acute distress HEENT: Neck supple, trachea midline, no carotid bruits, no thyroidmegaly Lungs: Clear to auscultation, no rhonchi or wheezes, or rib retractions  Heart: Regular rate and rhythm, no murmurs or gallops Breast:Examined in sitting and supine position were symmetrical in appearance, no palpable masses or tenderness,  no skin retraction, no nipple inversion, no nipple discharge, no skin discoloration, no axillary or supraclavicular lymphadenopathy Abdomen: no palpable masses or tenderness, no rebound or guarding Extremities: no edema or skin discoloration or  tenderness  Pelvic:  Bartholin, Urethra, Skene Glands: Within normal limits             Vagina: No gross lesions or discharge  Cervix: No gross lesions or discharge  Uterus  difficult to examine due to patient's abdominal girth and vaginismus.  Adnexa  same as above  Anus and perineum  normal   Rectovaginal  normal sphincter tone without palpated masses or tenderness             Hemoccult not done     Assessment/Plan:  41 y.o. female for annual exam complaining several weeks of suprapubic discomfort. No dysuria or frequency fever chills nausea vomiting her back pain reported. Due to patient's size and limited pelvic examination she will be asked to return next week for a pelvic ultrasound and to come in a fasting state so we can obtain the following lab work: Fasting lipid profile, fasting blood sugar, TSH and CBC. We will check her urinalysis today. We discussed a new Pap smear screening guidelines. Since her last Pap smear was normal 2012 we'll not do another Pap smear for 3 years. She was also reminded to schedule her mammogram for later in the year.    Ok Edwards MD, 2:57 PM 05/16/2011

## 2011-05-16 NOTE — ED Notes (Signed)
Pt c/o vaginal pain-throbbing, pulsating x 2 weeks.  States she is still a virgin.  Denies any vag d/c, odor, or dysuria.  Her GYN told her she is running a low temp as she had a pelvic exam and pap smear there today (Dr. Lily Peer).  Pt states she hasn't been getting a lot of sleep lately b/c of the pain.  She says she needs at least 7 hrs of sleep/night b/c of her psych disorders or else her mind get running too fast.  She seems to be very anxious b/c of this stating she is bipolar, borderline personality and ADD.  Calming techniques provided to her.

## 2011-05-16 NOTE — ED Provider Notes (Signed)
History     CSN: [id number]  Arrival date & time 05/16/11  1638   First MD Initiated Contact with Patient 05/16/11 1905      Chief Complaint  Patient presents with  . Vaginal Pain    (Consider location/radiation/quality/duration/timing/severity/associated sxs/prior treatment) Patient is a 41 y.o. female presenting with vaginal pain. The history is provided by the patient. No language interpreter was used.  Vaginal Pain This is a new problem. The current episode started more than 1 week ago (2 weeks ago). The problem occurs constantly. The problem has been gradually worsening. Pertinent negatives include no chest pain, no abdominal pain, no headaches and no shortness of breath. The symptoms are aggravated by nothing. The symptoms are relieved by nothing. She has tried nothing for the symptoms.    Past Medical History  Diagnosis Date  . Bipolar disorder   . Eczema   . Pes planus   . Achilles tendinitis   . Hypertension   . Migraine   . Achilles tendinitis   . Ganglion cyst 09/29/2009    left wrist (2 cyst)  . Arthritis   . Allergy   . Lipoma   . ADHD (attention deficit hyperactivity disorder)   . Hyperprolactinemia     Past Surgical History  Procedure Date  . Tumor resection left thigh   . Ankle surgery 12/88    left   . Lipoma removal   . Ganglion cyst excision 2011  . Chest nodule 1990?    rt chest wall nodule removal  . Shoulder surgery 01/13/2011    right, partial tear    Family History  Problem Relation Age of Onset  . Hypertension Mother   . Hyperlipidemia Mother   . Heart attack Father   . Heart disease Father   . Hypertension Father   . Diabetes Paternal Grandfather   . Heart disease Maternal Aunt   . Breast cancer Maternal Aunt   . Heart disease Maternal Grandmother   . Cancer Maternal Grandmother     colon    History  Substance Use Topics  . Smoking status: Never Smoker   . Smokeless tobacco: Never Used  . Alcohol Use: No    OB History     Grav Para Term Preterm Abortions TAB SAB Ect Mult Living                  Review of Systems  Constitutional: Negative for fever, chills, activity change, appetite change and fatigue.  HENT: Negative for congestion, sore throat, rhinorrhea, neck pain and neck stiffness.   Respiratory: Negative for cough and shortness of breath.   Cardiovascular: Negative for chest pain and palpitations.  Gastrointestinal: Negative for nausea, vomiting and abdominal pain.  Genitourinary: Positive for vaginal pain and pelvic pain. Negative for dysuria, urgency, frequency, flank pain, vaginal bleeding and vaginal discharge.  Musculoskeletal: Negative for back pain and arthralgias.  Neurological: Negative for dizziness, weakness, light-headedness, numbness and headaches.  All other systems reviewed and are negative.    Allergies  Adhesive; Dilaudid; Morphine; Penicillins; Percocet; Prednisone; Provera; and Ultram  Home Medications   Current Outpatient Rx  Name Route Sig Dispense Refill  . AMPHETAMINE-DEXTROAMPHETAMINE 5 MG PO TABS Oral Take 5 mg by mouth 2 (two) times daily.    Marland Kitchen DIVALPROEX SODIUM 500 MG PO TBEC Oral Take 1,000 mg by mouth at bedtime.     Marland Kitchen HYDROCHLOROTHIAZIDE 25 MG PO TABS Oral Take 25 mg by mouth daily.      Marland Kitchen HYDROXYZINE PAMOATE  50 MG PO CAPS Oral Take 50 mg by mouth 3 (three) times daily as needed. For anxiety    . METOPROLOL SUCCINATE ER 100 MG PO TB24 Oral Take 100 mg by mouth daily.      . ADULT MULTIVITAMIN W/MINERALS CH Oral Take 1 tablet by mouth daily.    Marland Kitchen NITROGLYCERIN 0.2 MG/HR TD PT24  Apply 1/4 patch to affected area and change daily for rotator cuff 30 patch 1  . PROMETHAZINE HCL 25 MG PO TABS Oral Take 25 mg by mouth every 6 (six) hours as needed. For nausea    . IMITREX IJ Injection Inject 6 mg as directed as needed. For migraine pain    . ZOLPIDEM TARTRATE 10 MG PO TABS Oral Take 10 mg by mouth at bedtime as needed. For insomnia    . HYDROCODONE-ACETAMINOPHEN  5-500 MG PO TABS Oral Take 1-2 tablets by mouth every 6 (six) hours as needed for pain. 20 tablet 0  . NAPROXEN 500 MG PO TABS Oral Take 1 tablet (500 mg total) by mouth 2 (two) times daily. 30 tablet 0    BP 127/78  Pulse 66  Temp(Src) 98.4 F (36.9 C) (Oral)  Resp 16  SpO2 95%  LMP 05/08/2011  Physical Exam  Nursing note and vitals reviewed. Constitutional: She is oriented to person, place, and time. She appears well-developed and well-nourished.  HENT:  Head: Normocephalic and atraumatic.  Mouth/Throat: Oropharynx is clear and moist.  Eyes: Conjunctivae and EOM are normal. Pupils are equal, round, and reactive to light.  Neck: Normal range of motion. Neck supple.  Cardiovascular: Normal rate, regular rhythm, normal heart sounds and intact distal pulses.  Exam reveals no gallop and no friction rub.   No murmur heard. Pulmonary/Chest: Effort normal and breath sounds normal. No respiratory distress. She exhibits no tenderness.  Abdominal: Soft. Bowel sounds are normal. There is no tenderness. There is no rebound and no guarding.  Genitourinary: Vagina normal. No vaginal discharge found.  Musculoskeletal: Normal range of motion. She exhibits no edema and no tenderness.  Neurological: She is alert and oriented to person, place, and time. No cranial nerve deficit.  Skin: Skin is warm and dry. No rash noted.    ED Course  Procedures (including critical care time)  Labs Reviewed  COMPREHENSIVE METABOLIC PANEL - Abnormal; Notable for the following:    Glucose, Bld 104 (*)    GFR calc non Af Amer 89 (*)    All other components within normal limits  CBC  DIFFERENTIAL  URINALYSIS, ROUTINE W REFLEX MICROSCOPIC  POCT PREGNANCY, URINE   US Pelvis Complete  05/16/2011  *RADIOLOGY REPORT*  Clinical Data: Uterine and ovarian pain.  The patient reports to the sonographer that she would prefer not have transvaginal imaging performed.  TRANSABDOMINAL ULTRASOUND OF PELVIS  Technique:   Transabdominal ultrasound examination of the pelvis was performed including evaluation of the uterus, ovaries, adnexal regions, and pelvic cul-de-sac.  Comparison:  None.  Findings:  Uterus:  Measures 6.4 x 3.4 x 5.1 cm.  No focal uterine mass is identified.  Endometrium: Measures 6.4 mm.  Right ovary: Normal appearance/no adnexal mass.  Measures 3.3 x 2.5 x 2.6 cm.  Left ovary: Normal appearance/no adnexal mass. Measures 3.2 x 2.5 x 2.8 cm.  Other Findings:  No free fluid.  IMPRESSION: Normal study. No evidence of pelvic mass or other significant abnormality.  Original Report Authenticated By: Britta Mccreedy, M.D.     1. Pelvic pain  MDM  Pelvic pain of unknown etiology. Urinalysis and blood work is unremarkable. Ultrasound is negative. Vaginal exam unremarkable. She'll be discharged home with instructions to followup with her primary care physician and OB/GYN.  Provided rx for vicodin and naprosyn.  No concern about additional etiology        Dayton Bailiff, MD 05/16/11 2113

## 2011-05-16 NOTE — Patient Instructions (Signed)
Remember to return in a fasting state one morning for labs. Mammogram due in summer.

## 2011-05-16 NOTE — ED Notes (Signed)
Pt states "at 1st it was intermittent but now the vaginal pain is so bad I couldn't sleep last night, I went to the OB-GYN for a 1:40 appt, arrived @ 1:20 but then was told they don't do US's on Tuesday, I also had an emergency therapy appt @ 3:00, went there and she didn't know what to do other than to send me here"

## 2011-05-16 NOTE — ED Notes (Signed)
Patient transported to Ultrasound 

## 2011-05-17 LAB — URINALYSIS W MICROSCOPIC + REFLEX CULTURE
Bacteria, UA: NONE SEEN
Bilirubin Urine: NEGATIVE
Casts: NONE SEEN
Crystals: NONE SEEN
Glucose, UA: NEGATIVE mg/dL
Hgb urine dipstick: NEGATIVE
Ketones, ur: NEGATIVE mg/dL
Leukocytes, UA: NEGATIVE
Nitrite: NEGATIVE
Protein, ur: NEGATIVE mg/dL
Specific Gravity, Urine: 1.009 (ref 1.005–1.030)
Squamous Epithelial / LPF: NONE SEEN
Urobilinogen, UA: 0.2 mg/dL (ref 0.0–1.0)
pH: 7 (ref 5.0–8.0)

## 2011-05-18 ENCOUNTER — Ambulatory Visit (INDEPENDENT_AMBULATORY_CARE_PROVIDER_SITE_OTHER): Payer: PRIVATE HEALTH INSURANCE | Admitting: Gynecology

## 2011-05-18 ENCOUNTER — Other Ambulatory Visit: Payer: PRIVATE HEALTH INSURANCE

## 2011-05-18 ENCOUNTER — Ambulatory Visit (INDEPENDENT_AMBULATORY_CARE_PROVIDER_SITE_OTHER): Payer: PRIVATE HEALTH INSURANCE

## 2011-05-18 ENCOUNTER — Telehealth: Payer: Self-pay | Admitting: *Deleted

## 2011-05-18 ENCOUNTER — Encounter: Payer: Self-pay | Admitting: Gynecology

## 2011-05-18 ENCOUNTER — Ambulatory Visit: Payer: PRIVATE HEALTH INSURANCE | Admitting: Gynecology

## 2011-05-18 ENCOUNTER — Other Ambulatory Visit: Payer: Self-pay | Admitting: Gynecology

## 2011-05-18 DIAGNOSIS — R350 Frequency of micturition: Secondary | ICD-10-CM

## 2011-05-18 DIAGNOSIS — E65 Localized adiposity: Secondary | ICD-10-CM

## 2011-05-18 DIAGNOSIS — L919 Hypertrophic disorder of the skin, unspecified: Secondary | ICD-10-CM

## 2011-05-18 DIAGNOSIS — N8300 Follicular cyst of ovary, unspecified side: Secondary | ICD-10-CM

## 2011-05-18 DIAGNOSIS — R102 Pelvic and perineal pain: Secondary | ICD-10-CM

## 2011-05-18 DIAGNOSIS — IMO0001 Reserved for inherently not codable concepts without codable children: Secondary | ICD-10-CM

## 2011-05-18 DIAGNOSIS — L909 Atrophic disorder of skin, unspecified: Secondary | ICD-10-CM

## 2011-05-18 DIAGNOSIS — N942 Vaginismus: Secondary | ICD-10-CM

## 2011-05-18 DIAGNOSIS — N949 Unspecified condition associated with female genital organs and menstrual cycle: Secondary | ICD-10-CM

## 2011-05-18 MED ORDER — URIBEL 118 MG PO CAPS: ORAL_CAPSULE | ORAL | Status: DC

## 2011-05-18 NOTE — Patient Instructions (Signed)
Will refer you to urologist to rule out interstitial cystitis  Interstitial Cystitis Interstitial cystitis (IC) is a condition that results in discomfort or pain in the bladder and the surrounding pelvic region. The symptoms can be different from case to case and even in the same individual. People may experience:  Mild discomfort.   Pressure.   Tenderness.   Intense pain in the bladder and pelvic area.  CAUSES  Because IC varies so much in symptoms and severity, people studying this disease believe it is not one but several diseases. Some caregivers use the term painful bladder syndrome (PBS) to describe cases with painful urinary symptoms. This may not meet the strictest definition of IC. The term IC / PBS includes all cases of urinary pain that cannot be connected to other causes, such as infection or urinary stones.  SYMPTOMS  Symptoms may include:  An urgent need to urinate.   A frequent need to urinate.   A combination of these symptoms.  Pain may change in intensity as the bladder fills with urine or as it empties. Women's symptoms often get worse during menstruation. They may sometimes experience pain with vaginal intercourse. Some of the symptoms of IC / PBS seem like those of bacterial infection. Tests do not show infection. IC / PBS is far more common in women than in men.  DIAGNOSIS  The diagnosis of IC / PBS is based on:  Presence of pain related to the bladder, usually along with problems of frequency and urgency.   Not finding other diseases that could cause the symptoms.   Diagnostic tests that help rule out other diseases include:   Urinalysis.   Urine culture.   Cystoscopy.   Biopsy of the bladder wall.   Distension of the bladder under anesthesia.   Urine cytology.   Laboratory examination of prostate secretions.  A biopsy is a tissue sample that can be looked at under a microscope. Samples of the bladder and urethra may be removed during a cystoscopy.  A biopsy helps rule out bladder cancer. TREATMENT  Scientists have not yet found a cure for IC / PBS. Patients with IC / PBS do not get better with antibiotic therapy. Caregivers cannot predict who will respond best to which treatment. Symptoms may disappear without explanation. Disappearing symptoms may coincide with an event such as a change in diet or treatment. Even when symptoms disappear, they may return after days, weeks, months, or years.  Because the causes of IC / PBS are unknown, current treatments are aimed at relieving symptoms. Many people are helped by one or a combination of the treatments. As researchers learn more about IC / PBS, the list of potential treatments will change. Patients should discuss their options with a caregiver. SURGERY  Surgery should be considered only if all available treatments have failed and the pain is disabling. Many approaches and techniques are used. Each approach has its own advantages and complications. Advantages and complications should be discussed with a urologist. Your caregiver may recommend consulting another urologist for a second opinion. Most caregivers are reluctant to operate because the outcome is unpredictable. Some people still have symptoms after surgery.   People considering surgery should discuss the potential risks and benefits, side effects, and long- and short-term complications with their family, as well as with people who have already had the procedure. Surgery requires anesthesia, hospitalization, and in some cases weeks or months of recovery. As the complexity of the procedure increases, so do the chances for  complications and for failure.  HOME CARE INSTRUCTIONS   All drugs, even those sold over the counter, have side effects. Patients should always consult a caregiver before using any drug for an extended amount of time. Only take over-the-counter or prescription medicines for pain, discomfort, or fever as directed by your  caregiver.   Many patients feel that smoking makes their symptoms worse. How the by-products of tobacco that are excreted in the urine affect IC / PBS is unknown. Smoking is the major known cause of bladder cancer. One of the best things smokers can do for their bladder and their overall health is to quit.   Many patients feel that gentle stretching exercises help relieve IC / PBS symptoms.   Methods vary, but basically patients decide to empty their bladder at designated times and use relaxation techniques and distractions to keep to the schedule. Gradually, patients try to lengthen the time between scheduled voids. A diary in which to record voiding times is usually helpful in keeping track of progress.  MAKE SURE YOU:   Understand these instructions.   Will watch your condition.   Will get help right away if you are not doing well or get worse.  Document Released: 09/17/2003 Document Revised: 01/05/2011 Document Reviewed: 12/02/2007 [facility] Patient Information 2012 [place], [place]

## 2011-05-18 NOTE — Progress Notes (Signed)
41 y.o who was seen  for annual exam on April 16 with complaint of several weeks of suprapubic discomfort. She denied any dysuria or frequency or back pain. Her urinalysis in the office was negative on the day. She had been on Junel 1/20 oral contraceptive pill and she had discontinued it  several months ago for no reason and states her cycles are now regular. She denies any GI complaints only urinary frequency. She denies fever chills nausea vomiting. Patient's currently being evaluated by her psychiatrist please see problem list for detail diagnosis. Patient not sexually active. She returned today for an ultrasound to see we can pinpoint the etiology of her three-week history of suprapubic discomfort. Ultrasound:  Uterus measured 9.4 x 5.5 x 3.7 mm with an endometrial stripe is 7.5 mm right ovary was normal as was the left ovary with several follicles no apparent masses seen on the right or left adnexa. No fluid in the cul-de-sac.\  Patient and informed that 2 days ago she went to the emergency room with persistence of her pain and was given Percocet and instructed to follow up with her primary physician. Patient did not appear to be in any acute distress today symptoms more subjective. She had no evidence of any acute abdomen.  Patient's symptoms highly suspicious for interstitial cystitis we'll refer to my urology colleagues for further evaluation. I will prescribe Uribell as an anti-spasmodic agent to take 1 by mouth 4 times a day for the next 3-5 days. Literature information on interstitial cystitis was provided.

## 2011-05-18 NOTE — Telephone Encounter (Signed)
Pt called stating pharmacy doesn't have rx for uribell, rx sent to pharmacy

## 2011-05-23 ENCOUNTER — Ambulatory Visit: Payer: PRIVATE HEALTH INSURANCE | Admitting: Sports Medicine

## 2011-06-06 ENCOUNTER — Ambulatory Visit (INDEPENDENT_AMBULATORY_CARE_PROVIDER_SITE_OTHER): Payer: PRIVATE HEALTH INSURANCE | Admitting: Sports Medicine

## 2011-06-06 VITALS — BP 110/64

## 2011-06-06 DIAGNOSIS — M79673 Pain in unspecified foot: Secondary | ICD-10-CM

## 2011-06-06 DIAGNOSIS — M25519 Pain in unspecified shoulder: Secondary | ICD-10-CM

## 2011-06-06 DIAGNOSIS — M25511 Pain in right shoulder: Secondary | ICD-10-CM

## 2011-06-06 DIAGNOSIS — M79609 Pain in unspecified limb: Secondary | ICD-10-CM

## 2011-06-06 NOTE — Assessment & Plan Note (Signed)
50% better s/p injection. Used NTG for 1 week. Recommend to use for 8-12 weeks. RTC 6 weeks to reassess.

## 2011-06-06 NOTE — Progress Notes (Signed)
  Subjective:    Patient ID: [name], female    DOB: 02/17/1970, 41 y.o.   MRN: [id number]  HPI [name] returns for followup of her left shoulder pain. She notes that she is approximately 50% better after her ultrasound guided subacromial injection in April of this year. She has learned to avoid provocative motions, but is somewhat apprehensive about returning to Dr. Eulah Pont for consideration of repeat arthroscopy. She did wear the nitroglycerin patch for only one week.  She is also having some recurrence of her right great toe pain. 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 She had an injection in September of 2012, that worked fairly well. She is desiring a repeat injection.  Review of Systems    No fevers, chills, night sweats, weight loss, chest pain, or shortness of breath.  Social History: Non-smoker. Objective:   Physical Exam General:  Well developed, well nourished, and in no acute distress. Neuro:  Alert and oriented x3, extra-ocular muscles intact. Skin: Warm and dry, no rashes noted. Respiratory:  Not using accessory muscles, speaking in full sentences. Right great toe unremarkable to inspection, no hallux rigidus.  Real-time Ultrasound Guided Injection of: right first metatarsophalangeal joint. Ultrasound guided injection is preferred based studies that show increased duration, increased effect, greater accuracy, decreased procedural pain, increased response rate, and decreased cost with ultrasound guided versus blind injection. Verbal informed consent obtained. Time-out conducted. Noted no overlying erythema, induration, or other signs of local infection. Skin prepped in a sterile fashion. Local anesthesia: Topical Ethyl chloride. With sterile technique and under real time ultrasound guidance: needle inserted under short axis into the first metatarsophalangeal joint, 1 cc Depo-Medrol 40, 1 cc lidocaine injected easily. Completed  without difficulty Pain immediately resolved suggesting accurate placement of the medication. Advised to call if fevers/chills, erythema, induration, drainage, or persistent bleeding. Images saved.     Assessment & Plan:

## 2011-06-06 NOTE — Assessment & Plan Note (Signed)
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 Injected again today. RTC prn.

## 2011-07-10 ENCOUNTER — Other Ambulatory Visit: Payer: Self-pay | Admitting: *Deleted

## 2011-07-10 MED ORDER — GABAPENTIN 600 MG PO TABS: 600.0000 mg | ORAL_TABLET | Freq: Every day | ORAL | Status: DC

## 2011-07-12 ENCOUNTER — Ambulatory Visit (INDEPENDENT_AMBULATORY_CARE_PROVIDER_SITE_OTHER): Payer: PRIVATE HEALTH INSURANCE | Admitting: Family Medicine

## 2011-07-12 ENCOUNTER — Encounter: Payer: Self-pay | Admitting: Family Medicine

## 2011-07-12 VITALS — BP 130/84 | HR 79 | Temp 98.2°F | Ht 69.0 in | Wt 220.0 lb

## 2011-07-12 DIAGNOSIS — M25559 Pain in unspecified hip: Secondary | ICD-10-CM

## 2011-07-12 DIAGNOSIS — M25552 Pain in left hip: Secondary | ICD-10-CM

## 2011-07-13 ENCOUNTER — Encounter: Payer: Self-pay | Admitting: Family Medicine

## 2011-07-13 DIAGNOSIS — M25552 Pain in left hip: Secondary | ICD-10-CM | POA: Insufficient documentation

## 2011-07-13 NOTE — Progress Notes (Signed)
Subjective:    Patient ID: [name], female    DOB: 02/23/1970, 41 y.o.   MRN: [id number]  PCP: Dr. Orlene Erm  HPI 41 yo F here for left hip pain.  Patient denies known injury. States over past two weeks has been limping and developed lateral left hip pain. Worse with bearing weight for prolonged periods and lying on left side. No swelling or bruising. No groin pain. No radiation of pain. No numbness or tingling. No back pain.  Past Medical History  Diagnosis Date  . Bipolar disorder   . Eczema   . Pes planus   . Achilles tendinitis   . Hypertension   . Migraine   . Achilles tendinitis   . Ganglion cyst 09/29/2009    left wrist (2 cyst)  . Arthritis   . Allergy   . Lipoma   . ADHD (attention deficit hyperactivity disorder)   . Hyperprolactinemia     Current Outpatient Prescriptions on File Prior to Visit  Medication Sig Dispense Refill  . amphetamine-dextroamphetamine (ADDERALL) 5 MG tablet Take 5 mg by mouth 2 (two) times daily.      . divalproex (DEPAKOTE) 500 MG EC tablet Take 1,000 mg by mouth at bedtime.       . gabapentin (NEURONTIN) 600 MG tablet Take 1 tablet (600 mg total) by mouth daily.  30 tablet  2  . hydrochlorothiazide 25 MG tablet Take 25 mg by mouth daily.        . hydrOXYzine (VISTARIL) 50 MG capsule Take 50 mg by mouth 3 (three) times daily as needed. For anxiety      . Meth-Hyo-M Bl-Na Phos-Ph Sal (URIBEL) 118 MG CAPS Take 1 tablet by mouth 4 times a day for next 3-5 days.  20 capsule  0  . metoprolol (TOPROL-XL) 100 MG 24 hr tablet Take 100 mg by mouth daily.        . Multiple Vitamin (MULITIVITAMIN WITH MINERALS) TABS Take 1 tablet by mouth daily.      . naproxen (NAPROSYN) 500 MG tablet Take 1 tablet (500 mg total) by mouth 2 (two) times daily.  30 tablet  0  . promethazine (PHENERGAN) 25 MG tablet Take 25 mg by mouth every 6 (six) hours as needed. For nausea      . SUMAtriptan Succinate (IMITREX IJ) Inject 6 mg as directed as needed. For  migraine pain      . zolpidem (AMBIEN) 10 MG tablet Take 10 mg by mouth at bedtime as needed. For insomnia        Past Surgical History  Procedure Date  . Tumor resection left thigh   . Ankle surgery 12/88    left   . Lipoma removal   . Ganglion cyst excision 2011  . Chest nodule 1990?    rt chest wall nodule removal  . Shoulder surgery 01/13/2011    right, partial tear    Allergies  Allergen Reactions  . Adhesive (Tape) Itching and Rash    Also reacted to Steri Strips and Band-Aids.  . Dilaudid (Hydromorphone Hcl)   . Morphine   . Penicillins   . Percocet (Oxycodone-Acetaminophen)   . Prednisone Hives  . Provera (Medroxyprogesterone Acetate) Other (See Comments)    Causes manic episodes  . Ultram (Tramadol Hcl)     History   Social History  . Marital Status: Single    Spouse Name: N/A    Number of Children: N/A  . Years of Education: N/A   Occupational History  .  Not on file.   Social History Main Topics  . Smoking status: Never Smoker   . Smokeless tobacco: Never Used  . Alcohol Use: No  . Drug Use: No  . Sexually Active: Yes    Birth Control/ Protection: Pill   Other Topics Concern  . Not on file   Social History Narrative  . No narrative on file    Family History  Problem Relation Age of Onset  . Hypertension Mother   . Hyperlipidemia Mother   . Heart attack Father   . Heart disease Father   . Hypertension Father   . Diabetes Paternal Grandfather   . Heart disease Maternal Aunt   . Breast cancer Maternal Aunt   . Heart disease Maternal Grandmother   . Cancer Maternal Grandmother     colon    BP 130/84  Pulse 79  Temp 98.2 F (36.8 C) (Oral)  Ht 5\' 9"  (1.753 m)  Wt 220 lb (99.791 kg)  BMI 32.49 kg/m2  Review of Systems See HPI above.    Objective:   Physical Exam Gen: NAD  L hip: No gross deformity, swelling or bruising. TTP over greater trochanter.  No low back, groin, other TTP. FROM with negative log roll. Negative  fabers and piriformis stretches - mild pain over greater trochanter however. NVI distally.    Assessment & Plan:  1. Left hip trochanteric bursitis - Greater trochanteric bursa injection given today.  Reviewed home stretches and strengthening exercise (hip abduction).  Icing, tylenol/nsaids as needed.  Consider formal PT and/or repeat injection if not improving as expected.  After informed written consent patient was lying on right side.  Area overlying left greater trochanteric bursa prepped with alcohol swab then area of bursa injected with 6:2 marcaine: depomedrol.  Patient tolerated this well without any immediate complications.

## 2011-07-16 NOTE — Assessment & Plan Note (Signed)
Left hip trochanteric bursitis - Greater trochanteric bursa injection given today.  Reviewed home stretches and strengthening exercise (hip abduction).  Icing, tylenol/nsaids as needed.  Consider formal PT and/or repeat injection if not improving as expected.  After informed written consent patient was lying on right side.  Area overlying left greater trochanteric bursa prepped with alcohol swab then area of bursa injected with 6:2 marcaine: depomedrol.  Patient tolerated this well without any immediate complications.

## 2011-07-18 ENCOUNTER — Encounter: Payer: Self-pay | Admitting: Sports Medicine

## 2011-07-18 ENCOUNTER — Ambulatory Visit (INDEPENDENT_AMBULATORY_CARE_PROVIDER_SITE_OTHER): Payer: PRIVATE HEALTH INSURANCE | Admitting: Sports Medicine

## 2011-07-18 VITALS — BP 115/83 | HR 71

## 2011-07-18 DIAGNOSIS — M79673 Pain in unspecified foot: Secondary | ICD-10-CM

## 2011-07-18 DIAGNOSIS — M25511 Pain in right shoulder: Secondary | ICD-10-CM

## 2011-07-18 DIAGNOSIS — M25519 Pain in unspecified shoulder: Secondary | ICD-10-CM

## 2011-07-18 DIAGNOSIS — M79609 Pain in unspecified limb: Secondary | ICD-10-CM

## 2011-07-18 MED ORDER — GABAPENTIN (ONCE-DAILY) 600 MG PO TABS: 1.0000 | ORAL_TABLET | Freq: Every day | ORAL | Status: DC

## 2011-07-18 NOTE — Assessment & Plan Note (Addendum)
7 weeks total with NTG so far. Overall better approx Cont gabapentin, changing to cont release formulation per pt request. RTC 4-6 weeks for this.

## 2011-07-18 NOTE — Progress Notes (Signed)
Patient ID: [name], female   DOB: 1970/02/14, 41 y.o.   MRN: [id number]  Subjective:   [id number] right shoulder, as well as right great toe.   HPI: [name] is a very pleasant 41 year old female who returns for followup:  Right shoulder: Rotator cuff impingement syndrome. She got significant relief from a single subacromial ultrasound guided injection. Since that have had her on nitroglycerin patches for approximately 7 weeks. Overall the shoulder is almost completely better.  Regarding her right first metatarsophalangeal joint, her pain is completely resolved since the injection 6 weeks ago.  Past medical history, surgical history, family history, social history, allergies, and medications reviewed from the medical record and no changes needed.  Review of Systems: No fevers, chills, night sweats, weight loss, chest pain, or shortness of breath.    Objective:  General:  Well Developed, well nourished, and in no acute distress. Neuro:  Alert and oriented x3, extra-ocular muscles intact. Skin: Warm and dry, no rashes noted. Respiratory:  Not using accessory muscles, speaking in full sentences. Musculoskeletal: Shoulder: Inspection reveals no abnormalities, atrophy or asymmetry. Palpation is normal with no tenderness over AC joint or bicipital groove. ROM is full in all planes. Rotator cuff strength normal throughout. No signs of impingement with negative Neer and Hawkin's tests, empty can sign. Speeds and Yergason's tests normal. No labral pathology noted with negative Obrien's, negative clunk and good stability. Normal scapular function observed. No painful arc and no drop arm sign. No apprehension sign  No tenderness to palpation, and good motion around her first metatarsophalangeal joint on the right side.  Assessment & Plan:

## 2011-07-18 NOTE — Assessment & Plan Note (Signed)
Right great to better s/p injection 6 wks ago. RTC prn for this.

## 2011-08-01 ENCOUNTER — Ambulatory Visit (INDEPENDENT_AMBULATORY_CARE_PROVIDER_SITE_OTHER): Payer: PRIVATE HEALTH INSURANCE | Admitting: Sports Medicine

## 2011-08-01 VITALS — BP 110/80

## 2011-08-01 DIAGNOSIS — M79673 Pain in unspecified foot: Secondary | ICD-10-CM

## 2011-08-01 DIAGNOSIS — M25511 Pain in right shoulder: Secondary | ICD-10-CM

## 2011-08-01 DIAGNOSIS — M79609 Pain in unspecified limb: Secondary | ICD-10-CM

## 2011-08-01 DIAGNOSIS — M25519 Pain in unspecified shoulder: Secondary | ICD-10-CM

## 2011-08-01 NOTE — Assessment & Plan Note (Signed)
Ultrasound guided injection as above. Orthotics made, first metatarsal ray posting create. She will come back to see [place] as needed.

## 2011-08-01 NOTE — Progress Notes (Signed)
Patient ID: [name], female   DOB: 1970/05/18, 41 y.o.   MRN: [id number] Subjective:   [id number] right great toe pain, right shoulder pain.  [id number] notes her right shoulder pain has since resolved after the ultrasound guided subacromial injection. I did inject her right first metatarsophalangeal joint approximately 8 weeks ago. Some of the pain is back, and she is desiring one additional repeat injection today. She wears sports insoles, but does not have any first metatarsal ray posting.  Past medical history, surgical history, family history, social history, allergies, and medications reviewed from the medical record and no changes needed.  Review of Systems: No fevers, chills, night sweats, weight loss, chest pain, or shortness of breath.    Objective:   General: Well developed, well nourished, and in no acute distress.  Neuro: Alert and oriented x3, extra-ocular muscles intact.  Skin: Warm and dry, no rashes noted.  Respiratory: Not using accessory muscles, speaking in full sentences.  Right great toe unremarkable to inspection, no hallux rigidus.  Real-time Ultrasound Guided Injection of: right first metatarsophalangeal joint.  Ultrasound guided injection is preferred based studies that show increased duration, increased effect, greater accuracy, decreased procedural pain, increased response rate, and decreased cost with ultrasound guided versus blind injection.  Verbal informed consent obtained.  Time-out conducted.  Noted no overlying erythema, induration, or other signs of local infection.  Skin prepped in a sterile fashion.  Local anesthesia: Topical Ethyl chloride.  With sterile technique and under real time ultrasound guidance: needle inserted under short axis into the first metatarsophalangeal joint, 1 cc Depo-Medrol 40, 1 cc lidocaine injected easily.  Completed without difficulty  Pain immediately resolved suggesting accurate placement of the medication.  Advised  to call if fevers/chills, erythema, induration, drainage, or persistent bleeding.  Images saved.  Patient was fitted for a standard, cushioned, semi-rigid orthotic. The orthotic was heated and afterward the patient stood on the orthotic blank positioned on the orthotic stand. The patient was positioned in subtalar neutral position and 10 degrees of ankle dorsiflexion in a weight bearing stance. After completion of molding, a stable base was applied to the orthotic blank. The blank was ground to a stable position for weight bearing. Size: 10 Base: Foam Posting and Padding: first metatarsal ray posting on the right side. Assessment & Plan:

## 2011-08-01 NOTE — Assessment & Plan Note (Signed)
Resolved status post ultrasound-guided subacromial bursa injection over 8 weeks ago.

## 2011-08-08 ENCOUNTER — Encounter: Payer: Self-pay | Admitting: *Deleted

## 2011-08-08 NOTE — Progress Notes (Signed)
Patient ID: [name], female   DOB: [birth year], 41 y.o.   MRN: [id number]  Pt called stating she is having a tingling "pins and needles" sensation since yesterday in the end of the great toe she had injection 08/01/11,.  States her toe felt much better for the few days following the injection.  Per Dr. Darrick Penna this is probably the effects of numbing medication wearing off.   If it does not improve call back and we will schedule her to come in for appt.

## 2011-09-01 ENCOUNTER — Ambulatory Visit (INDEPENDENT_AMBULATORY_CARE_PROVIDER_SITE_OTHER): Payer: BC Managed Care – PPO | Admitting: Sports Medicine

## 2011-09-01 ENCOUNTER — Encounter: Payer: Self-pay | Admitting: Sports Medicine

## 2011-09-01 VITALS — BP 115/76 | HR 75 | Ht 70.0 in | Wt 218.0 lb

## 2011-09-01 DIAGNOSIS — M216X9 Other acquired deformities of unspecified foot: Secondary | ICD-10-CM

## 2011-09-01 NOTE — Progress Notes (Signed)
[name] is here with new onset of right toe numbness. This is in her first great toe. Patient has had this for a little over a week now. Patient has been seen previously for having pain in the first metatarsal joint and having to injections done by Dr. Karie Schwalbe which did help the pain. Patient has also been fitted with custom orthotics which she has enjoyed and helped her with a decent amount of her foot pain as well. Patient though states that whenever she stands on her foot or walks long distances she has significant numbness in that first right toe. Patient denies much pain denies swelling denies any injury to the area. Patient also denies any history of gout or any skin changes in the area.  Review of systems as stated above in history of present illness otherwise negative.  Physical exam: Gen. no apparent distress patient does have pressured speech and does have some tangential thought patterns. Alert and oriented x3 Foot exam: Patient does have some minor pes planus bilaterally as well as some overpronation with ambulation. Patient does have significant breakdown of the right transverse arch. Patient has good movement of the first metatarsal in both flexion and extension. When patient has pressure on the plantar aspect underneath the metatarsal heads patient states that the numbness in her right toe goes away. Good capillary refill of the toes and other extremities. No significant skin changes.

## 2011-09-01 NOTE — Patient Instructions (Addendum)
Very nice to meet you We changed your orthotics a little today.  We want you to come back in 3 weeks and make sure you are doing better.  Keep taking you neurontin at same dose.

## 2011-09-01 NOTE — Assessment & Plan Note (Signed)
Patient was fitted today with metatarsal pads to relieve the numbness in her right great toe. Do think this numbness was completely mechanical in nature. Patient did have breakdown of her transverse plantar arch which was contributing to loading the first toe and causing mechanical impingement. Patient will try these metatarsal pads on her custom orthotics for the next 3 weeks and followup at that time if she's not completely better. If patient is still having trouble at that time we will consider increasing her Neurontin up to 600 mg twice a day.

## 2011-09-11 ENCOUNTER — Ambulatory Visit (INDEPENDENT_AMBULATORY_CARE_PROVIDER_SITE_OTHER): Payer: BC Managed Care – PPO | Admitting: Sports Medicine

## 2011-09-11 ENCOUNTER — Other Ambulatory Visit: Payer: Self-pay | Admitting: *Deleted

## 2011-09-11 VITALS — BP 126/86 | Ht 70.0 in | Wt 218.0 lb

## 2011-09-11 DIAGNOSIS — M7060 Trochanteric bursitis, unspecified hip: Secondary | ICD-10-CM

## 2011-09-11 DIAGNOSIS — M791 Myalgia, unspecified site: Secondary | ICD-10-CM

## 2011-09-11 DIAGNOSIS — M25559 Pain in unspecified hip: Secondary | ICD-10-CM

## 2011-09-11 DIAGNOSIS — M76899 Other specified enthesopathies of unspecified lower limb, excluding foot: Secondary | ICD-10-CM

## 2011-09-11 DIAGNOSIS — IMO0001 Reserved for inherently not codable concepts without codable children: Secondary | ICD-10-CM

## 2011-09-11 DIAGNOSIS — M79609 Pain in unspecified limb: Secondary | ICD-10-CM

## 2011-09-11 LAB — CBC
HCT: 40.7 % (ref 36.0–46.0)
Hemoglobin: 13.4 g/dL (ref 12.0–15.0)
MCH: 29.6 pg (ref 26.0–34.0)
MCHC: 32.9 g/dL (ref 30.0–36.0)
MCV: 90 fL (ref 78.0–100.0)
Platelets: 263 10*3/uL (ref 150–400)
RBC: 4.52 MIL/uL (ref 3.87–5.11)
RDW: 13.4 % (ref 11.5–15.5)
WBC: 8.6 10*3/uL (ref 4.0–10.5)

## 2011-09-11 MED ORDER — KETOROLAC TROMETHAMINE 60 MG/2ML IM SOLN
60.0000 mg | Freq: Once | INTRAMUSCULAR | Status: AC
  Administered 2011-09-11: 60 mg via INTRAMUSCULAR

## 2011-09-11 NOTE — Progress Notes (Signed)
  Subjective:    Patient ID: [name], female    DOB: 22-Oct-1970, 41 y.o.   MRN: [id number]  HPI Patient comes in today complaining of left hip pain. Also complaining of pain across the dorsum of her foot. She was seen at an orthopedic urgent care over the weekend. She was told to take over-the-counter Advil. She had an identical problem with his left hip back in June. She saw Dr. Pearletha Forge who injected her greater trochanteric bursa. This was helpful but her pain has now returned. Pain is along the lateral aspect of her hip. Worse with activity. She denies and groin pain. No recent trauma.  She still getting some numbness of the right great toe. It is worse at rest and improves with walking. She denies numbness elsewhere in the foot and ankle but is getting pain across the dorsum of the foot.  Patient is also complaining of multiple aches and pains throughout her body. She states that they've been present now for quite some. She's never had a rheumatologic work-up.   Review of Systems     Objective:   Physical Exam Well-developed, well-nourished. No acute distress. Awake alert and oriented x3  Gen. inspection shows no obvious soft tissue swelling or joint effusions. She is tender to palpation over the left greater trochanteric bursa. Smooth painless hip range of motion. Right foot shows some slight ecchymosis across the dorsum of the foot. No soft tissue swelling. Neurovascular intact distally. Walking without significant limp Neurological exam shows no gross abnormalities of either lower extremity. No atrophy.      Assessment & Plan:  1. Left hip pain secondary greater trochanteric bursitis 2. Diffuse myalgia 3. Right great toe numbness, ? Atypical lumbar radiculopathy vs digital nerve compression  For diffuse pain I decided to inject her with 80 mg of Depo-Medrol IM and 60 mg of Toradol IM. She shown IT band stretches. She is already aware of hip abductor strengthening exercises.  If pain persists in the left hip, may consider a repeat cortisone injection. Her right great toe numbness is somewhat of a mystery. It does not appear to be too debilitating. I will keep her on 600 mg of Neurontin daily. I would like to check some basic blood work just to rule out inflammatory arthropathy. We will check a CBC, CMET, sedimentation rate, ANA, and rheumatoid factor. I'll call her with those results once available.

## 2011-09-12 LAB — COMPREHENSIVE METABOLIC PANEL
ALT: 10 U/L (ref 0–35)
AST: 18 U/L (ref 0–37)
Albumin: 4.3 g/dL (ref 3.5–5.2)
Alkaline Phosphatase: 58 U/L (ref 39–117)
BUN: 10 mg/dL (ref 6–23)
CO2: 27 mEq/L (ref 19–32)
Calcium: 9.6 mg/dL (ref 8.4–10.5)
Chloride: 104 mEq/L (ref 96–112)
Creat: 0.83 mg/dL (ref 0.50–1.10)
Glucose, Bld: 85 mg/dL (ref 70–99)
Potassium: 3.9 mEq/L (ref 3.5–5.3)
Sodium: 141 mEq/L (ref 135–145)
Total Bilirubin: 0.6 mg/dL (ref 0.3–1.2)
Total Protein: 6.2 g/dL (ref 6.0–8.3)

## 2011-09-12 LAB — RHEUMATOID FACTOR: Rhuematoid fact SerPl-aCnc: <10 IU/mL (ref <=14)

## 2011-09-12 LAB — SEDIMENTATION RATE: Sed Rate: 1 mm/hr (ref 0–22)

## 2011-09-13 ENCOUNTER — Telehealth: Payer: Self-pay | Admitting: *Deleted

## 2011-09-13 LAB — ANA: Anti Nuclear Antibody(ANA): NEGATIVE

## 2011-09-13 NOTE — Telephone Encounter (Signed)
Called pt left VM to return my call. 

## 2011-09-13 NOTE — Telephone Encounter (Signed)
Message copied by Mora Bellman on Wed Sep 13, 2011  4:09 PM ------      Message from: Reino Bellis R      Created: Wed Sep 13, 2011  3:03 PM      Regarding: labs       Please call and tell [name] her labs are normal..No evidence of Rheumatoid or Lupus.      ----- Message -----         From: [facility] Interface         Sent: 09/11/2011  11:00 PM           To: Ralene Cork, DO

## 2011-09-14 ENCOUNTER — Ambulatory Visit: Payer: BC Managed Care – PPO | Admitting: Sports Medicine

## 2011-09-14 NOTE — Telephone Encounter (Signed)
[name] returned call and I informed her that her labs were normal.  She states her hip feels much better.  Will follow up with Dr. Margaretha Sheffield if pain persist. Terese Door, CMA

## 2011-09-27 ENCOUNTER — Ambulatory Visit (INDEPENDENT_AMBULATORY_CARE_PROVIDER_SITE_OTHER): Payer: BC Managed Care – PPO | Admitting: Family Medicine

## 2011-09-27 ENCOUNTER — Encounter: Payer: Self-pay | Admitting: Family Medicine

## 2011-09-27 VITALS — BP 127/90 | HR 71 | Ht 70.0 in | Wt 218.0 lb

## 2011-09-27 DIAGNOSIS — M25511 Pain in right shoulder: Secondary | ICD-10-CM

## 2011-09-27 DIAGNOSIS — M25519 Pain in unspecified shoulder: Secondary | ICD-10-CM

## 2011-09-27 DIAGNOSIS — M79609 Pain in unspecified limb: Secondary | ICD-10-CM

## 2011-09-27 DIAGNOSIS — M79671 Pain in right foot: Secondary | ICD-10-CM

## 2011-09-27 MED ORDER — GABAPENTIN (ONCE-DAILY) 600 MG PO TABS: 1.0000 | ORAL_TABLET | Freq: Two times a day (BID) | ORAL | Status: DC

## 2011-09-27 NOTE — Patient Instructions (Addendum)
Thank you for coming in today. I think the reason you are walking on the outside of the foot is that the shoe is breaking down.  Try to get a new pair.  Please come back a few weeks after you break your shoes in and we will watch you walk.  Let me know if you get worse.

## 2011-09-28 DIAGNOSIS — M79671 Pain in right foot: Secondary | ICD-10-CM | POA: Insufficient documentation

## 2011-09-28 NOTE — Assessment & Plan Note (Signed)
I believe the cause of her new supination is more breakdown of the shoe than anything else.  Additionally she may require some correction to her custom orthotic.  Plan: Get new neutral walking/running shoes.  And wear them in with custom orthotics for around 3 weeks. Then come back for reevaluation. We may need to add a lateral post to the orthotics

## 2011-09-28 NOTE — Progress Notes (Signed)
[name] is a 41 y.o. female who presents to [facility] today for right foot pain.  Patient noted that over the last 2 weeks or so she is been walking more on the lateral aspect of her right foot. She is starting to experience mild pain on the lateral aspect of her mid foot.  She feels well otherwise.  She denies any injury. No weakness numbness or difficulty walking. Wearing the same shoes for the last 1 year.  She did have custom orthotics made approximately 2 months ago.  These are comfortable and not cause a problem until now.    PMH reviewed. Significant for bipolar disorder History  Substance Use Topics  . Smoking status: Never Smoker   . Smokeless tobacco: Never Used  . Alcohol Use: No   ROS as above otherwise neg   Exam:  BP 127/90  Pulse 71  Ht 5\' 10"  (1.778 m)  Wt 218 lb (98.884 kg)  BMI 31.28 kg/m2 Gen: Well NAD MSK: Right foot. Relatively normal appearing mild collapse of transverse arch.  Nontender.  Normal foot motion.  Strength is intact to  eversion, inversion, dorsiflexion, plantarflexion. 2+ pulses throughout.  Gait analysis:  Mild supination of the right foot otherwise relatively normal.  Inspection of the shoe:  Mild collapse of the foam rubber on the lateral aspect of the right shoe.

## 2011-10-03 ENCOUNTER — Ambulatory Visit: Payer: BC Managed Care – PPO | Admitting: Family Medicine

## 2011-10-05 ENCOUNTER — Encounter: Payer: Self-pay | Admitting: *Deleted

## 2011-10-06 ENCOUNTER — Ambulatory Visit (INDEPENDENT_AMBULATORY_CARE_PROVIDER_SITE_OTHER): Payer: BC Managed Care – PPO | Admitting: Family Medicine

## 2011-10-06 VITALS — BP 110/60 | Ht 70.0 in | Wt 218.0 lb

## 2011-10-06 DIAGNOSIS — IMO0001 Reserved for inherently not codable concepts without codable children: Secondary | ICD-10-CM

## 2011-10-06 DIAGNOSIS — M7918 Myalgia, other site: Secondary | ICD-10-CM

## 2011-10-09 NOTE — Progress Notes (Signed)
  Subjective:    Patient ID: [name], female    DOB: [birth year], 41 y.o.   MRN: [id number]  HPI  Right posterior shoulder pain. This is been bothered her for for 5 days. She was doing a lot of studying in noticed that the morning after that. Pain is sharp. Not worse with inspiration or expiration. No shortness of breath. Right-hand dominant.  Review of Systems See history of present illness.    Objective:   Physical Exam  Vital signs are reviewed. GENERAL: Well-developed female no acute distress SHOULDER: Right. Full range of motion in all planes the rotator cuff with intact strength. Tender to palpation over the right rhomboid muscle and this reproduces her pain.      Assessment & Plan:  Rhomboid strain. I showed her some strengthening exercises specifically pushups and overhead press. Reassured her this is nothing of concern. She'll followup when necessary

## 2011-10-18 ENCOUNTER — Ambulatory Visit: Payer: BC Managed Care – PPO | Admitting: Family Medicine

## 2011-10-24 ENCOUNTER — Ambulatory Visit: Payer: BC Managed Care – PPO | Admitting: Gynecology

## 2011-10-25 ENCOUNTER — Emergency Department (HOSPITAL_COMMUNITY)
Admission: EM | Admit: 2011-10-25 | Discharge: 2011-10-26 | Disposition: A | Discharge status: Other Acute Inpatient Hospital | Payer: BC Managed Care – PPO | Attending: Emergency Medicine | Admitting: Emergency Medicine

## 2011-10-25 ENCOUNTER — Ambulatory Visit: Payer: BC Managed Care – PPO | Admitting: Gynecology

## 2011-10-25 DIAGNOSIS — F3130 Bipolar disorder, current episode depressed, mild or moderate severity, unspecified: Secondary | ICD-10-CM

## 2011-10-25 DIAGNOSIS — F319 Bipolar disorder, unspecified: Secondary | ICD-10-CM | POA: Insufficient documentation

## 2011-10-25 DIAGNOSIS — F603 Borderline personality disorder: Secondary | ICD-10-CM

## 2011-10-25 DIAGNOSIS — Z79899 Other long term (current) drug therapy: Secondary | ICD-10-CM | POA: Insufficient documentation

## 2011-10-25 DIAGNOSIS — R45851 Suicidal ideations: Secondary | ICD-10-CM | POA: Insufficient documentation

## 2011-10-25 DIAGNOSIS — F909 Attention-deficit hyperactivity disorder, unspecified type: Secondary | ICD-10-CM

## 2011-10-25 DIAGNOSIS — I10 Essential (primary) hypertension: Secondary | ICD-10-CM | POA: Insufficient documentation

## 2011-10-25 LAB — CBC
HCT: 43.4 % (ref 36.0–46.0)
Hemoglobin: 14.6 g/dL (ref 12.0–15.0)
MCH: 30.4 pg (ref 26.0–34.0)
MCHC: 33.6 g/dL (ref 30.0–36.0)
MCV: 90.4 fL (ref 78.0–100.0)
Platelets: 280 10*3/uL (ref 150–400)
RBC: 4.8 MIL/uL (ref 3.87–5.11)
RDW: 11.9 % (ref 11.5–15.5)
WBC: 6.3 10*3/uL (ref 4.0–10.5)

## 2011-10-25 LAB — RAPID URINE DRUG SCREEN, HOSP PERFORMED
Amphetamines: NOT DETECTED
Barbiturates: NOT DETECTED
Benzodiazepines: NOT DETECTED
Cocaine: NOT DETECTED
Opiates: NOT DETECTED
Tetrahydrocannabinol: NOT DETECTED

## 2011-10-25 LAB — COMPREHENSIVE METABOLIC PANEL
ALT: 10 U/L (ref 0–35)
AST: 16 U/L (ref 0–37)
Albumin: 3.8 g/dL (ref 3.5–5.2)
Alkaline Phosphatase: 66 U/L (ref 39–117)
BUN: 9 mg/dL (ref 6–23)
CO2: 30 mEq/L (ref 19–32)
Calcium: 9.9 mg/dL (ref 8.4–10.5)
Chloride: 100 mEq/L (ref 96–112)
Creatinine, Ser: 0.89 mg/dL (ref 0.50–1.10)
GFR calc Af Amer: >90 mL/min (ref >90)
GFR calc non Af Amer: 79 mL/min — ABNORMAL LOW (ref >90)
Glucose, Bld: 104 mg/dL — ABNORMAL HIGH (ref 70–99)
Potassium: 4.3 mEq/L (ref 3.5–5.1)
Sodium: 139 mEq/L (ref 135–145)
Total Bilirubin: 0.3 mg/dL (ref 0.3–1.2)
Total Protein: 6.6 g/dL (ref 6.0–8.3)

## 2011-10-25 LAB — URINE MICROSCOPIC-ADD ON

## 2011-10-25 LAB — URINALYSIS, ROUTINE W REFLEX MICROSCOPIC
Bilirubin Urine: NEGATIVE
Glucose, UA: NEGATIVE mg/dL
Ketones, ur: NEGATIVE mg/dL
Leukocytes, UA: NEGATIVE
Nitrite: NEGATIVE
Protein, ur: NEGATIVE mg/dL
Specific Gravity, Urine: 1.021 (ref 1.005–1.030)
Urobilinogen, UA: 0.2 mg/dL (ref 0.0–1.0)
pH: 7.5 (ref 5.0–8.0)

## 2011-10-25 LAB — SALICYLATE LEVEL: Salicylate Lvl: <2 mg/dL — ABNORMAL LOW (ref 2.8–20.0)

## 2011-10-25 LAB — ETHANOL: Alcohol, Ethyl (B): <11 mg/dL (ref 0–11)

## 2011-10-25 LAB — ACETAMINOPHEN LEVEL: Acetaminophen (Tylenol), Serum: <15 ug/mL (ref 10–30)

## 2011-10-25 LAB — POCT PREGNANCY, URINE: Preg Test, Ur: NEGATIVE

## 2011-10-25 MED ORDER — LURASIDONE HCL 40 MG PO TABS
40.0000 mg | ORAL_TABLET | Freq: Every day | ORAL | Status: DC
  Filled 2011-10-25: qty 1

## 2011-10-25 MED ORDER — ZOLPIDEM TARTRATE 5 MG PO TABS
5.0000 mg | ORAL_TABLET | Freq: Every evening | ORAL | Status: DC | PRN
  Administered 2011-10-26: 5 mg via ORAL
  Filled 2011-10-25: qty 1

## 2011-10-25 MED ORDER — HYDROCHLOROTHIAZIDE 25 MG PO TABS
25.0000 mg | ORAL_TABLET | Freq: Every day | ORAL | Status: DC
  Administered 2011-10-26: 25 mg via ORAL
  Filled 2011-10-25: qty 1

## 2011-10-25 MED ORDER — GABAPENTIN (ONCE-DAILY) 600 MG PO TABS: 600.0000 mg | ORAL_TABLET | Freq: Every day | ORAL | Status: DC

## 2011-10-25 MED ORDER — LURASIDONE HCL 80 MG PO TABS
80.0000 mg | ORAL_TABLET | Freq: Once | ORAL | Status: AC
  Administered 2011-10-25: 80 mg via ORAL
  Filled 2011-10-25: qty 1

## 2011-10-25 MED ORDER — AMPHETAMINE-DEXTROAMPHETAMINE 10 MG PO TABS
10.0000 mg | ORAL_TABLET | Freq: Two times a day (BID) | ORAL | Status: DC
  Administered 2011-10-25 – 2011-10-26 (×3): 10 mg via ORAL
  Filled 2011-10-25 (×3): qty 1

## 2011-10-25 MED ORDER — LURASIDONE HCL 80 MG PO TABS
80.0000 mg | ORAL_TABLET | Freq: Every day | ORAL | Status: DC
  Filled 2011-10-25: qty 1

## 2011-10-25 MED ORDER — LAMOTRIGINE 150 MG PO TABS
150.0000 mg | ORAL_TABLET | Freq: Every day | ORAL | Status: DC
  Administered 2011-10-25: 150 mg via ORAL
  Filled 2011-10-25 (×2): qty 1

## 2011-10-25 MED ORDER — AMPHETAMINE-DEXTROAMPHETAMINE 5 MG PO TABS: 10.0000 mg | ORAL_TABLET | Freq: Two times a day (BID) | ORAL | Status: DC

## 2011-10-25 MED ORDER — GABAPENTIN 300 MG PO CAPS
600.0000 mg | ORAL_CAPSULE | Freq: Every day | ORAL | Status: DC
  Filled 2011-10-25: qty 2

## 2011-10-25 MED ORDER — ALUM & MAG HYDROXIDE-SIMETH 200-200-20 MG/5ML PO SUSP: 30.0000 mL | ORAL | Status: DC | PRN

## 2011-10-25 MED ORDER — DIVALPROEX SODIUM 500 MG PO DR TAB
1000.0000 mg | DELAYED_RELEASE_TABLET | Freq: Every day | ORAL | Status: DC
  Administered 2011-10-25: 1000 mg via ORAL
  Filled 2011-10-25: qty 2

## 2011-10-25 MED ORDER — IBUPROFEN 600 MG PO TABS
600.0000 mg | ORAL_TABLET | Freq: Three times a day (TID) | ORAL | Status: DC | PRN
  Administered 2011-10-26: 600 mg via ORAL
  Filled 2011-10-25: qty 1

## 2011-10-25 MED ORDER — ONDANSETRON HCL 4 MG PO TABS: 4.0000 mg | ORAL_TABLET | Freq: Three times a day (TID) | ORAL | Status: DC | PRN

## 2011-10-25 MED ORDER — METOPROLOL SUCCINATE ER 100 MG PO TB24
100.0000 mg | ORAL_TABLET | Freq: Every day | ORAL | Status: DC
  Administered 2011-10-26: 100 mg via ORAL
  Filled 2011-10-25: qty 1

## 2011-10-25 MED ORDER — LURASIDONE HCL 40 MG PO TABS
80.0000 mg | ORAL_TABLET | Freq: Every day | ORAL | Status: DC
  Administered 2011-10-26: 80 mg via ORAL
  Filled 2011-10-25 (×3): qty 2

## 2011-10-25 MED ORDER — LORAZEPAM 1 MG PO TABS
1.0000 mg | ORAL_TABLET | Freq: Three times a day (TID) | ORAL | Status: DC | PRN
  Administered 2011-10-26: 1 mg via ORAL
  Filled 2011-10-25 (×3): qty 1

## 2011-10-25 MED ORDER — GABAPENTIN (ONCE-DAILY) 600 MG PO TABS
600.0000 mg | ORAL_TABLET | Freq: Two times a day (BID) | ORAL | Status: DC
  Administered 2011-10-25: 600 mg via ORAL
  Filled 2011-10-25 (×3): qty 1

## 2011-10-25 NOTE — ED Notes (Signed)
Received report from Conrad, RN.  

## 2011-10-25 NOTE — ED Notes (Signed)
MD at bedside. Dr. Shela Commons, Psychiatrist at bedside.

## 2011-10-25 NOTE — ED Notes (Signed)
Pt states mental health hx.  Unemployed.  Not doing well in school.  Feels like an outsider.  Financial problems.  Over the last month, SI has been getting worse.  Pt masturbates to the point of it hurting to self harm. Pt states that she has never had sex and is afraid of it.

## 2011-10-25 NOTE — ED Provider Notes (Signed)
History     CSN: [id number]  Arrival date & time 10/25/11  1128   First MD Initiated Contact with Patient 10/25/11 1320      Chief Complaint  Patient presents with  . Suicidal    (Consider location/radiation/quality/duration/timing/severity/associated sxs/prior treatment) HPI  41 year old female with hx of bipolar, ADHD, and depression presents with SI.  Pt reports having to deal with a lot of stress including being unemployed, financial problems, not doing well in school and also fear of sexual encounter.  SHe has had SI for many years with several prior suicide attempt including walking in front of moving vehicle or driving wrecklessly.  She has not had suicide attempts in many years.  However, her SI is constant and on a daily basis, more prominent for the past month.  She also reports "discovering masturbation and has been masturbating excessively".  She notices her emotion has been swinging wildly.  Has rapid thoughts, unable to sleep.  Has been seen by her PCP and was started on a new medication Latuda which has helped somewhat.  She is here in ER at the urging of her psychiatrist and her best friend because of her persistent SI.  Denies self medicating with alcohol or rec drugs.  Denies any specific SI plan.  No HI or hallucination.    Past Medical History  Diagnosis Date  . Bipolar disorder   . Eczema   . Pes planus   . Achilles tendinitis   . Hypertension   . Migraine   . Achilles tendinitis   . Ganglion cyst 09/29/2009    left wrist (2 cyst)  . Arthritis   . Allergy   . Lipoma   . ADHD (attention deficit hyperactivity disorder)   . Hyperprolactinemia     Past Surgical History  Procedure Date  . Tumor resection left thigh   . Ankle surgery 12/88    left   . Lipoma removal   . Ganglion cyst excision 2011  . Chest nodule 1990?    rt chest wall nodule removal  . Shoulder surgery 01/13/2011    right, partial tear    Family History  Problem Relation Age of Onset   . Hypertension Mother   . Hyperlipidemia Mother   . Heart attack Father   . Heart disease Father   . Hypertension Father   . Diabetes Paternal Grandfather   . Heart disease Maternal Aunt   . Breast cancer Maternal Aunt   . Heart disease Maternal Grandmother   . Cancer Maternal Grandmother     colon    History  Substance Use Topics  . Smoking status: Never Smoker   . Smokeless tobacco: Never Used  . Alcohol Use: No    OB History    Grav Para Term Preterm Abortions TAB SAB Ect Mult Living                  Review of Systems  All other systems reviewed and are negative.    Allergies  Adhesive; Dilaudid; Morphine; Penicillins; Percocet; Prednisone; Provera; and Ultram  Home Medications   Current Outpatient Rx  Name Route Sig Dispense Refill  . AMPHETAMINE-DEXTROAMPHETAMINE 5 MG PO TABS Oral Take 10 mg by mouth 2 (two) times daily.     Marland Kitchen VITAMIN D-3 1000 UNITS PO CAPS Oral Take 1,000 Units by mouth daily.    Marland Kitchen VITAMIN B-12 PO Oral Take 1 tablet by mouth daily. Pt doesn't know the dosage    . DIVALPROEX SODIUM  500 MG PO TBEC Oral Take 1,000 mg by mouth at bedtime.     Marland Kitchen GABAPENTIN (PHN) 600 MG PO TABS Oral Take 600 mg by mouth daily.    Marland Kitchen GABAPENTIN (PHN) 600 MG PO TABS Oral Take 1 tablet by mouth 2 (two) times daily. 180 tablet 3  . HYDROCHLOROTHIAZIDE 25 MG PO TABS Oral Take 25 mg by mouth daily.      Marland Kitchen HYDROXYZINE PAMOATE 50 MG PO CAPS Oral Take 50 mg by mouth 3 (three) times daily as needed. For anxiety    . LAMOTRIGINE 100 MG PO TABS Oral Take 150 mg by mouth at bedtime.     Marland Kitchen LURASIDONE HCL 40 MG PO TABS Oral Take 40 mg by mouth daily with breakfast. Pt's therapy was supposed to change tonight to 80mg  dose. Per pts md.    . METOPROLOL SUCCINATE ER 100 MG PO TB24 Oral Take 100 mg by mouth daily.      . ADULT MULTIVITAMIN W/MINERALS CH Oral Take 1 tablet by mouth daily.    Marland Kitchen PROMETHAZINE HCL 25 MG PO TABS Oral Take 25 mg by mouth every 6 (six) hours as needed. For  nausea    . IMITREX IJ Injection Inject 6 mg as directed as needed. For migraine pain    . VITAMIN C 500 MG PO TABS Oral Take 500 mg by mouth daily.    Marland Kitchen ZOLPIDEM TARTRATE 10 MG PO TABS Oral Take 10 mg by mouth at bedtime as needed. For insomnia      BP 141/93  Pulse 83  Temp 98.1 F (36.7 C) (Oral)  Resp 18  SpO2 100%  LMP 10/16/2011  Physical Exam  Nursing note and vitals reviewed. Constitutional: She appears well-developed and well-nourished. No distress.       Awake, alert, nontoxic appearance  HENT:  Head: Atraumatic.  Mouth/Throat: Oropharynx is clear and moist.  Eyes: Conjunctivae normal are normal. Right eye exhibits no discharge. Left eye exhibits no discharge.  Neck: Normal range of motion. Neck supple.  Cardiovascular: Normal rate and regular rhythm.   Pulmonary/Chest: Effort normal. No respiratory distress. She exhibits no tenderness.  Abdominal: Soft. There is no tenderness. There is no rebound.  Musculoskeletal: She exhibits no tenderness.       ROM appears intact, no obvious focal weakness  Neurological: Coordination and gait normal. GCS eye subscore is 4. GCS verbal subscore is 5. GCS motor subscore is 6.       Mental status and motor strength appears intact  Skin: No rash noted.  Psychiatric: Her affect is labile. Her speech is rapid and/or pressured. She is is hyperactive. Cognition and memory are normal. She expresses inappropriate judgment. She expresses suicidal ideation. She expresses no homicidal ideation. She expresses no suicidal plans and no homicidal plans.    ED Course  Procedures (including critical care time)   Labs Reviewed  POCT PREGNANCY, URINE  ACETAMINOPHEN LEVEL  CBC  COMPREHENSIVE METABOLIC PANEL  ETHANOL  SALICYLATE LEVEL  URINE RAPID DRUG SCREEN (HOSP PERFORMED)  URINALYSIS, ROUTINE W REFLEX MICROSCOPIC   Results for orders placed during the hospital encounter of 10/25/11  ACETAMINOPHEN LEVEL      Component Value Range    Acetaminophen (Tylenol), Serum <15.0  10 - 30 ug/mL  CBC      Component Value Range   WBC 6.3  4.0 - 10.5 K/uL   RBC 4.80  3.87 - 5.11 MIL/uL   Hemoglobin 14.6  12.0 - 15.0 g/dL   HCT  43.4  36.0 - 46.0 %   MCV 90.4  78.0 - 100.0 fL   MCH 30.4  26.0 - 34.0 pg   MCHC 33.6  30.0 - 36.0 g/dL   RDW 29.5  62.1 - 30.8 %   Platelets 280  150 - 400 K/uL  COMPREHENSIVE METABOLIC PANEL      Component Value Range   Sodium 139  135 - 145 mEq/L   Potassium 4.3  3.5 - 5.1 mEq/L   Chloride 100  96 - 112 mEq/L   CO2 30  19 - 32 mEq/L   Glucose, Bld 104 (*) 70 - 99 mg/dL   BUN 9  6 - 23 mg/dL   Creatinine, Ser 6.57  0.50 - 1.10 mg/dL   Calcium 9.9  8.4 - 84.6 mg/dL   Total Protein 6.6  6.0 - 8.3 g/dL   Albumin 3.8  3.5 - 5.2 g/dL   AST 16  0 - 37 U/L   ALT 10  0 - 35 U/L   Alkaline Phosphatase 66  39 - 117 U/L   Total Bilirubin 0.3  0.3 - 1.2 mg/dL   GFR calc non Af Amer 79 (*) >90 mL/min   GFR calc Af Amer >90  >90 mL/min  ETHANOL      Component Value Range   Alcohol, Ethyl (B) <11  0 - 11 mg/dL  SALICYLATE LEVEL      Component Value Range   Salicylate Lvl <2.0 (*) 2.8 - 20.0 mg/dL  URINE RAPID DRUG SCREEN (HOSP PERFORMED)      Component Value Range   Opiates NONE DETECTED  NONE DETECTED   Cocaine NONE DETECTED  NONE DETECTED   Benzodiazepines NONE DETECTED  NONE DETECTED   Amphetamines NONE DETECTED  NONE DETECTED   Tetrahydrocannabinol NONE DETECTED  NONE DETECTED   Barbiturates NONE DETECTED  NONE DETECTED  URINALYSIS, ROUTINE W REFLEX MICROSCOPIC      Component Value Range   Color, Urine YELLOW  YELLOW   APPearance CLOUDY (*) CLEAR   Specific Gravity, Urine 1.021  1.005 - 1.030   pH 7.5  5.0 - 8.0   Glucose, UA NEGATIVE  NEGATIVE mg/dL   Hgb urine dipstick SMALL (*) NEGATIVE   Bilirubin Urine NEGATIVE  NEGATIVE   Ketones, ur NEGATIVE  NEGATIVE mg/dL   Protein, ur NEGATIVE  NEGATIVE mg/dL   Urobilinogen, UA 0.2  0.0 - 1.0 mg/dL   Nitrite NEGATIVE  NEGATIVE   Leukocytes, UA  NEGATIVE  NEGATIVE  POCT PREGNANCY, URINE      Component Value Range   Preg Test, Ur NEGATIVE  NEGATIVE  URINE MICROSCOPIC-ADD ON      Component Value Range   Squamous Epithelial / LPF MANY (*) RARE   WBC, UA 0-2  <3 WBC/hpf   RBC / HPF 0-2  <3 RBC/hpf   Bacteria, UA MANY (*) RARE   Urine-Other MUCOUS PRESENT     No results found.  1. Suicidal ideation   MDM  Persistent SI.  Is medically cleared.  My attending is aware of pt.    BP 141/93  Pulse 83  Temp 98.1 F (36.7 C) (Oral)  Resp 18  SpO2 100%  LMP 10/16/2011  Nursing notes reviewed and considered in documentation  Previous records reviewed and considered  All labs/vitals reviewed and considered   3:24 PM Consulted ACT who will continue further care.  Psych hold and Med Rec filled.          Fayrene Helper, PA-C  10/25/11 1524  Fayrene Helper, PA-C 10/25/11 1524

## 2011-10-25 NOTE — ED Notes (Signed)
Upon arrival to pts room pt states "Is there not another nurse that can do this?" pt refusing to talk to this RN.

## 2011-10-25 NOTE — ED Notes (Signed)
[id number]> Expected date:<BR> Expected time:<BR> Means of arrival:<BR> Comments:<BR> Triage 4

## 2011-10-25 NOTE — ED Notes (Addendum)
Pt placed in blue scrubs.  Wanded by security.

## 2011-10-25 NOTE — ED Notes (Signed)
Pt upset that Dr. Shela Commons told her she could contract for safety and leave. Pt states "i'm not getting the help I need. I'm so mad I could throw this chair!" Pt standing against opposite wall from door. Reported to pt that I would talk to Dr. Shela Commons and convey her concerns to him.

## 2011-10-25 NOTE — ED Notes (Signed)
Pt calm, cooperative. Pt resting quietly in bed. No needs at this time.

## 2011-10-25 NOTE — ED Notes (Signed)
Pt refused Neurontin. Pt states it makes her feel even more crazy. Pt concerned that "my meds are going to be messed up.". Pt remains calm, cooperative. No needs at this time.

## 2011-10-25 NOTE — Consult Note (Signed)
Reason for Consult: Bipolar disorder, most recent episode depression, and suicidal ideation, with the plan of driving off of the road Referring Physician: Dr. Merrilee Jansky [name] is an 41 y.o. female.  HPI: Patient was seen and chart reviewed. Patient was known to [facility] since she had multiple acute psychiatric hospitalizations. Her last psychiatric hospitalization was 09/28/2010 to 10/05/2010. Reportedly patient has increased the stresses mostly psychosocial, school, finances, socialization, becoming more and more suicidal. Patient was seeing Dr. Nolen Mu, who has started new antipsychotic medication Latuda on 10/11/2011, reportedly helping her with the decreased racing thoughts, mood swings, anger outbursts. Patient and her primary psychiatrist has a plan of increasing her medication Latuda to 80 mg daily at her office and at the same time referred to hospital for safety. Patient cannot contract for safety at this time. Patient stated she was previously hospitalized at the [facility], [facility] and [facility] and does not have a good experience. Patient has a DBT therapist Ancil Linsey, and the regular therapist at [facility], Ph.[facility]   Past Medical History  Diagnosis Date  . Bipolar disorder   . Eczema   . Pes planus   . Achilles tendinitis   . Hypertension   . Migraine   . Achilles tendinitis   . Ganglion cyst 09/29/2009    left wrist (2 cyst)  . Arthritis   . Allergy   . Lipoma   . ADHD (attention deficit hyperactivity disorder)   . Hyperprolactinemia     Past Surgical History  Procedure Date  . Tumor resection left thigh   . Ankle surgery 12/88    left   . Lipoma removal   . Ganglion cyst excision 2011  . Chest nodule 1990?    rt chest wall nodule removal  . Shoulder surgery 01/13/2011    right, partial tear    Family History  Problem Relation Age of Onset  . Hypertension Mother   .  Hyperlipidemia Mother   . Heart attack Father   . Heart disease Father   . Hypertension Father   . Diabetes Paternal Grandfather   . Heart disease Maternal Aunt   . Breast cancer Maternal Aunt   . Heart disease Maternal Grandmother   . Cancer Maternal Grandmother     colon    Social History:  reports that she has never smoked. She has never used smokeless tobacco. She reports that she does not drink alcohol or use illicit drugs.  Allergies:  Allergies  Allergen Reactions  . Adhesive (Tape) Itching and Rash    Also reacted to Steri Strips and Band-Aids.  . Dilaudid (Hydromorphone Hcl) Itching  . Morphine Nausea And Vomiting  . Penicillins Hives  . Percocet (Oxycodone-Acetaminophen) Itching  . Prednisone Hives  . Provera (Medroxyprogesterone Acetate) Other (See Comments)    Causes manic episodes  . Ultram (Tramadol Hcl) Itching    Medications: I have reviewed the patient's current medications.  Results for orders placed during the hospital encounter of 10/25/11 (from the past 48 hour(s))  URINE RAPID DRUG SCREEN (HOSP PERFORMED)     Status: Normal   Collection Time   10/25/11 12:37 PM      Component Value Range Comment   Opiates NONE DETECTED  NONE DETECTED    Cocaine NONE DETECTED  NONE DETECTED    Benzodiazepines NONE DETECTED  NONE DETECTED    Amphetamines NONE DETECTED  NONE DETECTED    Tetrahydrocannabinol NONE DETECTED  NONE DETECTED    Barbiturates NONE DETECTED  NONE DETECTED   URINALYSIS, ROUTINE W REFLEX MICROSCOPIC     Status: Abnormal   Collection Time   10/25/11 12:37 PM      Component Value Range Comment   Color, Urine YELLOW  YELLOW    APPearance CLOUDY (*) CLEAR    Specific Gravity, Urine 1.021  1.005 - 1.030    pH 7.5  5.0 - 8.0    Glucose, UA NEGATIVE  NEGATIVE mg/dL    Hgb urine dipstick SMALL (*) NEGATIVE    Bilirubin Urine NEGATIVE  NEGATIVE    Ketones, ur NEGATIVE  NEGATIVE mg/dL    Protein, ur NEGATIVE  NEGATIVE mg/dL    Urobilinogen, UA 0.2   0.0 - 1.0 mg/dL    Nitrite NEGATIVE  NEGATIVE    Leukocytes, UA NEGATIVE  NEGATIVE   URINE MICROSCOPIC-ADD ON     Status: Abnormal   Collection Time   10/25/11 12:37 PM      Component Value Range Comment   Squamous Epithelial / LPF MANY (*) RARE    WBC, UA 0-2  <3 WBC/hpf    RBC / HPF 0-2  <3 RBC/hpf    Bacteria, UA MANY (*) RARE    Urine-Other MUCOUS PRESENT     POCT PREGNANCY, URINE     Status: Normal   Collection Time   10/25/11  1:12 PM      Component Value Range Comment   Preg Test, Ur NEGATIVE  NEGATIVE   ACETAMINOPHEN LEVEL     Status: Normal   Collection Time   10/25/11  1:25 PM      Component Value Range Comment   Acetaminophen (Tylenol), Serum <15.0  10 - 30 ug/mL   CBC     Status: Normal   Collection Time   10/25/11  1:25 PM      Component Value Range Comment   WBC 6.3  4.0 - 10.5 K/uL    RBC 4.80  3.87 - 5.11 MIL/uL    Hemoglobin 14.6  12.0 - 15.0 g/dL    HCT 16.1  09.6 - 04.5 %    MCV 90.4  78.0 - 100.0 fL    MCH 30.4  26.0 - 34.0 pg    MCHC 33.6  30.0 - 36.0 g/dL    RDW 40.9  81.1 - 91.4 %    Platelets 280  150 - 400 K/uL   COMPREHENSIVE METABOLIC PANEL     Status: Abnormal   Collection Time   10/25/11  1:25 PM      Component Value Range Comment   Sodium 139  135 - 145 mEq/L    Potassium 4.3  3.5 - 5.1 mEq/L    Chloride 100  96 - 112 mEq/L    CO2 30  19 - 32 mEq/L    Glucose, Bld 104 (*) 70 - 99 mg/dL    BUN 9  6 - 23 mg/dL    Creatinine, Ser 7.82  0.50 - 1.10 mg/dL    Calcium 9.9  8.4 - 95.6 mg/dL    Total Protein 6.6  6.0 - 8.3 g/dL    Albumin 3.8  3.5 - 5.2 g/dL    AST 16  0 - 37 U/L    ALT 10  0 - 35 U/L    Alkaline Phosphatase 66  39 - 117 U/L    Total Bilirubin 0.3  0.3 - 1.2 mg/dL    GFR calc non Af Amer 79 (*) >90  mL/min    GFR calc Af Amer >90  >90 mL/min   ETHANOL     Status: Normal   Collection Time   10/25/11  1:25 PM      Component Value Range Comment   Alcohol, Ethyl (B) <11  0 - 11 mg/dL   SALICYLATE LEVEL     Status: Abnormal    Collection Time   10/25/11  1:25 PM      Component Value Range Comment   Salicylate Lvl <2.0 (*) 2.8 - 20.0 mg/dL     No results found.  No depression, No anxiety and Positive for ADHD, anxiety, bad mood, bipolar, borderline personality disorder and depression Blood pressure 141/93, pulse 83, temperature 98.1 F (36.7 C), temperature source Oral, resp. rate 18, last menstrual period 10/16/2011, SpO2 100.00%.   Assessment/Plan: Bipolar disorder, most recent episode depression. Attention deficit hyperactivity disorder. Borderline personality disorder  Recommended acute psychiatric hospitalization for crisis stabilization, safety and therapeutic milieu. Patient medication to the was increased to 80 mg once at bedtime with meals. She will continue rest of her medication as it is for now.  Kasen Adduci,JANARDHAHA R. 10/25/2011, 5:40 PM

## 2011-10-25 NOTE — ED Provider Notes (Signed)
Medical screening examination/treatment/procedure(s) were performed by non-physician practitioner and as supervising physician I was immediately available for consultation/collaboration.   Celene Kras, MD 10/25/11 [phone]
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
## 2011-10-26 ENCOUNTER — Inpatient Hospital Stay (HOSPITAL_COMMUNITY)
Admission: RE | Admit: 2011-10-26 | Discharge: 2011-10-30 | DRG: 430 | Disposition: A | Discharge status: Home / Self Care | Payer: BC Managed Care – PPO | Attending: Psychiatry | Admitting: Psychiatry

## 2011-10-26 ENCOUNTER — Emergency Department (HOSPITAL_COMMUNITY): Payer: BC Managed Care – PPO

## 2011-10-26 ENCOUNTER — Encounter (HOSPITAL_COMMUNITY): Payer: Self-pay | Admitting: Emergency Medicine

## 2011-10-26 ENCOUNTER — Encounter (HOSPITAL_COMMUNITY): Payer: Self-pay | Admitting: *Deleted

## 2011-10-26 DIAGNOSIS — Z885 Allergy status to narcotic agent status: Secondary | ICD-10-CM

## 2011-10-26 DIAGNOSIS — M2140 Flat foot [pes planus] (acquired), unspecified foot: Secondary | ICD-10-CM | POA: Diagnosis present

## 2011-10-26 DIAGNOSIS — M65979 Unspecified synovitis and tenosynovitis, unspecified ankle and foot: Secondary | ICD-10-CM | POA: Diagnosis present

## 2011-10-26 DIAGNOSIS — F900 Attention-deficit hyperactivity disorder, predominantly inattentive type: Secondary | ICD-10-CM

## 2011-10-26 DIAGNOSIS — F411 Generalized anxiety disorder: Secondary | ICD-10-CM | POA: Diagnosis present

## 2011-10-26 DIAGNOSIS — M129 Arthropathy, unspecified: Secondary | ICD-10-CM | POA: Diagnosis present

## 2011-10-26 DIAGNOSIS — M659 Synovitis and tenosynovitis, unspecified: Secondary | ICD-10-CM | POA: Diagnosis present

## 2011-10-26 DIAGNOSIS — F603 Borderline personality disorder: Secondary | ICD-10-CM | POA: Diagnosis present

## 2011-10-26 DIAGNOSIS — E229 Hyperfunction of pituitary gland, unspecified: Secondary | ICD-10-CM | POA: Diagnosis present

## 2011-10-26 DIAGNOSIS — I10 Essential (primary) hypertension: Secondary | ICD-10-CM | POA: Diagnosis present

## 2011-10-26 DIAGNOSIS — Z79899 Other long term (current) drug therapy: Secondary | ICD-10-CM

## 2011-10-26 DIAGNOSIS — Z880 Allergy status to penicillin: Secondary | ICD-10-CM

## 2011-10-26 DIAGNOSIS — G43909 Migraine, unspecified, not intractable, without status migrainosus: Secondary | ICD-10-CM | POA: Diagnosis present

## 2011-10-26 DIAGNOSIS — F332 Major depressive disorder, recurrent severe without psychotic features: Principal | ICD-10-CM | POA: Diagnosis present

## 2011-10-26 DIAGNOSIS — F339 Major depressive disorder, recurrent, unspecified: Secondary | ICD-10-CM

## 2011-10-26 DIAGNOSIS — Z888 Allergy status to other drugs, medicaments and biological substances status: Secondary | ICD-10-CM

## 2011-10-26 DIAGNOSIS — Z23 Encounter for immunization: Secondary | ICD-10-CM

## 2011-10-26 DIAGNOSIS — F988 Other specified behavioral and emotional disorders with onset usually occurring in childhood and adolescence: Secondary | ICD-10-CM | POA: Diagnosis present

## 2011-10-26 DIAGNOSIS — L259 Unspecified contact dermatitis, unspecified cause: Secondary | ICD-10-CM | POA: Diagnosis present

## 2011-10-26 DIAGNOSIS — D179 Benign lipomatous neoplasm, unspecified: Secondary | ICD-10-CM | POA: Diagnosis present

## 2011-10-26 MED ORDER — HYDROCHLOROTHIAZIDE 25 MG PO TABS
25.0000 mg | ORAL_TABLET | Freq: Every day | ORAL | Status: DC
  Administered 2011-10-27 – 2011-10-30 (×4): 25 mg via ORAL
  Filled 2011-10-26 (×5): qty 1

## 2011-10-26 MED ORDER — GABAPENTIN 600 MG PO TABS: 600.0000 mg | ORAL_TABLET | Freq: Two times a day (BID) | ORAL | Status: DC

## 2011-10-26 MED ORDER — ALUM & MAG HYDROXIDE-SIMETH 200-200-20 MG/5ML PO SUSP: 30.0000 mL | ORAL | Status: DC | PRN

## 2011-10-26 MED ORDER — ZOLPIDEM TARTRATE 10 MG PO TABS
10.0000 mg | ORAL_TABLET | Freq: Every evening | ORAL | Status: DC | PRN
  Administered 2011-10-26: 10 mg via ORAL
  Filled 2011-10-26: qty 1

## 2011-10-26 MED ORDER — LORAZEPAM 1 MG PO TABS
1.0000 mg | ORAL_TABLET | Freq: Once | ORAL | Status: AC
  Administered 2011-10-26: 1 mg via ORAL

## 2011-10-26 MED ORDER — INFLUENZA VIRUS VACC SPLIT PF IM SUSP
0.5000 mL | INTRAMUSCULAR | Status: AC
  Administered 2011-10-27: 0.5 mL via INTRAMUSCULAR

## 2011-10-26 MED ORDER — ACETAMINOPHEN 325 MG PO TABS
650.0000 mg | ORAL_TABLET | Freq: Four times a day (QID) | ORAL | Status: DC | PRN
  Administered 2011-10-28: 650 mg via ORAL

## 2011-10-26 MED ORDER — METOPROLOL SUCCINATE ER 100 MG PO TB24
100.0000 mg | ORAL_TABLET | Freq: Every day | ORAL | Status: DC
  Administered 2011-10-27 – 2011-10-30 (×4): 100 mg via ORAL
  Filled 2011-10-26 (×5): qty 1

## 2011-10-26 MED ORDER — LURASIDONE HCL 80 MG PO TABS
80.0000 mg | ORAL_TABLET | Freq: Every day | ORAL | Status: DC
  Administered 2011-10-27 – 2011-10-29 (×3): 80 mg via ORAL
  Filled 2011-10-26 (×4): qty 1

## 2011-10-26 MED ORDER — DIVALPROEX SODIUM 500 MG PO DR TAB
1000.0000 mg | DELAYED_RELEASE_TABLET | Freq: Every day | ORAL | Status: DC
  Administered 2011-10-26: 1000 mg via ORAL
  Filled 2011-10-26 (×3): qty 2

## 2011-10-26 MED ORDER — LAMOTRIGINE 100 MG PO TABS
150.0000 mg | ORAL_TABLET | Freq: Every day | ORAL | Status: DC
  Administered 2011-10-26: 200 mg via ORAL
  Administered 2011-10-27 – 2011-10-29 (×3): 150 mg via ORAL
  Filled 2011-10-26 (×3): qty 1.5
  Filled 2011-10-26: qty 2
  Filled 2011-10-26 (×2): qty 1.5

## 2011-10-26 MED ORDER — SUMATRIPTAN SUCCINATE 6 MG/0.5ML ~~LOC~~ SOLN
6.0000 mg | Freq: Once | SUBCUTANEOUS | Status: DC
  Filled 2011-10-26: qty 0.5

## 2011-10-26 MED ORDER — SUMATRIPTAN SUCCINATE 6 MG/0.5ML ~~LOC~~ SOLN
6.0000 mg | SUBCUTANEOUS | Status: DC | PRN
  Administered 2011-10-26 – 2011-10-28 (×2): 6 mg via SUBCUTANEOUS
  Filled 2011-10-26 (×2): qty 0.5

## 2011-10-26 MED ORDER — METOPROLOL TARTRATE 100 MG PO TABS
100.0000 mg | ORAL_TABLET | Freq: Every morning | ORAL | Status: DC
  Filled 2011-10-26: qty 1

## 2011-10-26 MED ORDER — MAGNESIUM HYDROXIDE 400 MG/5ML PO SUSP: 30.0000 mL | Freq: Every day | ORAL | Status: DC | PRN

## 2011-10-26 MED ORDER — AMPHETAMINE-DEXTROAMPHETAMINE 10 MG PO TABS
10.0000 mg | ORAL_TABLET | Freq: Two times a day (BID) | ORAL | Status: DC
  Administered 2011-10-27: 10 mg via ORAL
  Filled 2011-10-26 (×2): qty 1

## 2011-10-26 MED ORDER — HYDROXYZINE HCL 50 MG PO TABS: 50.0000 mg | ORAL_TABLET | Freq: Three times a day (TID) | ORAL | Status: DC | PRN

## 2011-10-26 MED ORDER — GABAPENTIN 300 MG PO CAPS
600.0000 mg | ORAL_CAPSULE | Freq: Two times a day (BID) | ORAL | Status: DC
  Administered 2011-10-27: 600 mg via ORAL
  Filled 2011-10-26 (×5): qty 2

## 2011-10-26 MED FILL — Gabapentin Tab 600 MG: ORAL | Qty: 1 | Status: AC
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
## 2011-10-26 NOTE — Progress Notes (Signed)
D: Patient in bed crying at the beginning of the shift. She appeared very angry, irritable and demanding. Patient complaint of migraine head ache and said he has been asking for medication from ED and they didn't give her anything. She appeared very upset about this. Charge nurse told this Clinical research associate that they decided to move patient to 400 hall because she wants to be under the care of Dr Allena Katz. She also said  and that she has already mentioned told the PA that patient needed medication for migraine.  A: Writer checked patient medication order to see if anything was ordered. Imitrex injection was ordered for migraine. Writer administered the medication and also offered patient her 2000 medications which she refused. She said she wanted brand name Gabapentin extended release and also refused Adderall stating that she had already taken the dose for the day. R: Pt received HS medications without difficulty. Tolerated the medications. Q 15 minute check continues to maintain safety.

## 2011-10-26 NOTE — ED Provider Notes (Addendum)
Filed Vitals:   10/25/11 2246  BP: 113/78  Pulse: 74  Temp: 97.9 F (36.6 C)  Resp: 18   No issues overnight.  Seen by Dr. Shela Commons, recommends inpatient evaluation and therapy.  Depression and SI.  [name]. Achol Azpeitia, MD 10/26/11 1610   3:32 PM Accepted to [facility] by Dr. Lolly Mustache.  [name]. Laurana Magistro, MD 10/26/11 [phone]

## 2011-10-26 NOTE — BHH Counselor (Signed)
[facility] said Dr. Lolly Mustache had accepted the pt pending bed availability per Fannie Knee at [facility].   [employer], LPC

## 2011-10-26 NOTE — ED Notes (Signed)
Patient was sleep and now she wakes and states that she wants ambiem . Told patient we don't wake patient up to give medication for sleep.
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
## 2011-10-26 NOTE — Progress Notes (Signed)
Admission note:  Patient admitted to [facility] from [facility] due to increased depression and SI.  Patient has had multiple admissions here at [facility].  She admits to financial, educational and emotional difficulties.  She is on her last semester at [employer] and feels that her peers are making it difficult for her to pass.  She is not happy with the care she has received at [facility] and banged her arm on the siderail of her bed because she did not received ambien last night.  Her right arm is causing her a lot of pain.  It was xrayed and no fractures were found.  She contracts for safety on the unit.  She denies any HI/AVH.  She does endorse SI.

## 2011-10-26 NOTE — ED Notes (Signed)
Patient wringing hands, pacing, stating she wanted to slam her head against the wall to get her to think straight. Pt states she wants to end her life because she is worthless and cant get herself straight. This Clinical research associate sat with patient and had a therapeutic conversation. Pt agreed not to hurt herself. This Clinical research associate spoke with the MD and gave an extra PRN medication. Pt now resting in bed.

## 2011-10-26 NOTE — Tx Team (Signed)
Initial Interdisciplinary Treatment Plan  PATIENT STRENGTHS: (choose at least two) Ability for insight Average or above average intelligence Capable of independent living Communication skills General fund of knowledge Physical Health Supportive family/friends Work skills  PATIENT STRESSORS: Educational concerns Financial difficulties Marital or family conflict Occupational concerns   PROBLEM LIST: Problem List/Patient Goals Date to be addressed Date deferred Reason deferred Estimated date of resolution  Depression 10-26-2011   Discharge  Suicidal Ideation 10-26-2011   Discharge  Educational Difficulty 10-26-2011   Discharge                                       DISCHARGE CRITERIA:  Ability to meet basic life and health needs Improved stabilization in mood, thinking, and/or behavior Medical problems require only outpatient monitoring Motivation to continue treatment in a less acute level of care Need for constant or close observation no longer present Reduction of life-threatening or endangering symptoms to within safe limits  PRELIMINARY DISCHARGE PLAN: Attend aftercare/continuing care group Outpatient therapy Return to previous living arrangement Return to previous work or school arrangements  PATIENT/FAMIILY INVOLVEMENT: This treatment plan has been presented to and reviewed with the patient, [name].  The patient and family have been given the opportunity to ask questions and make suggestions.  [name] 10/26/2011, 7:13 PM

## 2011-10-26 NOTE — ED Notes (Signed)
Attempted to call report to Adult Unit at [facility], placed on hold for a long period and no answer when called back, will attempt to call report again in a few minutes.

## 2011-10-27 DIAGNOSIS — F988 Other specified behavioral and emotional disorders with onset usually occurring in childhood and adolescence: Secondary | ICD-10-CM

## 2011-10-27 DIAGNOSIS — F603 Borderline personality disorder: Secondary | ICD-10-CM | POA: Diagnosis present

## 2011-10-27 DIAGNOSIS — F339 Major depressive disorder, recurrent, unspecified: Secondary | ICD-10-CM

## 2011-10-27 DIAGNOSIS — F411 Generalized anxiety disorder: Secondary | ICD-10-CM

## 2011-10-27 DIAGNOSIS — F900 Attention-deficit hyperactivity disorder, predominantly inattentive type: Secondary | ICD-10-CM | POA: Diagnosis present

## 2011-10-27 HISTORY — DX: Generalized anxiety disorder: F41.1

## 2011-10-27 HISTORY — DX: Borderline personality disorder: F60.3

## 2011-10-27 LAB — VALPROIC ACID LEVEL
Valproic Acid Lvl: 111.2 ug/mL — ABNORMAL HIGH (ref 50.0–100.0)
Valproic Acid Lvl: 60 ug/mL (ref 50.0–100.0)

## 2011-10-27 LAB — TSH: TSH: 1.725 u[IU]/mL (ref 0.350–4.500)

## 2011-10-27 MED ORDER — DIVALPROEX SODIUM ER 500 MG PO TB24
1000.0000 mg | ORAL_TABLET | Freq: Every day | ORAL | Status: DC
  Administered 2011-10-27 – 2011-10-29 (×3): 1000 mg via ORAL
  Filled 2011-10-27 (×5): qty 2

## 2011-10-27 MED ORDER — AMPHETAMINE-DEXTROAMPHETAMINE 10 MG PO TABS
10.0000 mg | ORAL_TABLET | ORAL | Status: DC
  Administered 2011-10-27 – 2011-10-30 (×6): 10 mg via ORAL
  Filled 2011-10-27 (×6): qty 1

## 2011-10-27 MED ORDER — GABAPENTIN (ONCE-DAILY) 600 MG PO TABS
600.0000 mg | ORAL_TABLET | ORAL | Status: DC
  Administered 2011-10-27 – 2011-10-30 (×6): 600 mg via ORAL

## 2011-10-27 MED ORDER — DOCUSATE SODIUM 100 MG PO CAPS
100.0000 mg | ORAL_CAPSULE | Freq: Every day | ORAL | Status: DC
  Administered 2011-10-29: 100 mg via ORAL
  Filled 2011-10-27 (×5): qty 1

## 2011-10-27 MED ORDER — GABAPENTIN 300 MG PO CAPS
600.0000 mg | ORAL_CAPSULE | ORAL | Status: DC
  Filled 2011-10-27 (×4): qty 2

## 2011-10-27 MED ORDER — BISACODYL 10 MG RE SUPP
10.0000 mg | Freq: Once | RECTAL | Status: AC
  Administered 2011-10-27: 10 mg via RECTAL
  Filled 2011-10-27: qty 1

## 2011-10-27 MED ORDER — HYDROXYZINE HCL 50 MG PO TABS
50.0000 mg | ORAL_TABLET | Freq: Two times a day (BID) | ORAL | Status: DC | PRN
  Administered 2011-10-28 – 2011-10-29 (×2): 50 mg via ORAL
  Filled 2011-10-27: qty 1

## 2011-10-27 MED ORDER — ZOLPIDEM TARTRATE 10 MG PO TABS
10.0000 mg | ORAL_TABLET | Freq: Every day | ORAL | Status: DC
  Administered 2011-10-27 – 2011-10-29 (×3): 10 mg via ORAL
  Filled 2011-10-27 (×3): qty 1

## 2011-10-27 NOTE — BHH Suicide Risk Assessment (Signed)
Suicide Risk Assessment  Admission Assessment     Nursing information obtained from:    Demographic factors:    Current Mental Status:    Loss Factors:    Historical Factors:    Risk Reduction Factors:     CLINICAL FACTORS:   Severe Anxiety and/or Agitation Depression:   Anhedonia Hopelessness Severe Personality Disorders:   Cluster B More than one psychiatric diagnosis Previous Psychiatric Diagnoses and Treatments Medical Diagnoses and Treatments/Surgeries  COGNITIVE FEATURES THAT CONTRIBUTE TO RISK:  Closed-mindedness    Current Mental Status Per Physician:  Diagnosis:  Axis I: Major depressive disorder recurrent,  generalized anxiety disorder, attention deficit hyperactivity disorder  inattentive type.  Axis II: Borderline personality disorder.   The patient was seen today and reports the following:   ADL's: Intact.  Sleep: The patient reports to sleeping well when she is given the medication Ambien at night. Appetite: The patient reports that her appetite is decreased today.   Mild>(1-10) >Severe  Hopelessness (1-10): 10  Depression (1-10): 10  Anxiety (1-10): 10   Suicidal Ideation: The patient reports constant suicidal ideations but with no plan or intent. Plan: No  Intent: No  Means: No   Homicidal Ideation: The patient denies any homicidal ideations today.  Plan: No  Intent: No.  Means: No   General Appearance/Behavior: The patient was cooperative today with this provider and was mildly angry and significantly depressed.  Eye Contact: Good.  Speech: Appropriate in rate and volume with no pressuring of speech noted today.  Motor Behavior: wnl.  Level of Consciousness: Alert and Oriented x 3.  Mental Status: Alert and Oriented x 3.  Mood: Severe Depression today.  Affect: Essentially Flat.  Anxiety Level: Severe anxiety reported today.  Thought Process: The patient denies any auditory or visual hallucinations today or delusional thinking.  Thought  Content: The patient denies any auditory or visual hallucinations today or delusional thinking.  Perception: The patient denies any auditory or visual hallucinations today or delusional thinking.  Judgment: Fair.  Insight: Fair  Cognition: Oriented to person, place and time.   Current Medications: amphetamine-dextroamphetamine  10 mg Oral [id number]  bisacodyl  10 mg Rectal Once  divalproex  1,000 mg Oral QHS  docusate sodium  100 mg Oral QHS  gabapentin  600 mg Oral BH-qamhs  hydrochlorothiazide  25 mg Oral Daily  influenza  inactive virus vaccine  0.5 mL Intramuscular Tomorrow-1000  lamoTRIgine  150 mg Oral QHS  lurasidone  80 mg Oral q1800  metoprolol succinate  100 mg Oral Daily  zolpidem  10 mg Oral QHS   Review of Systems:  Neurological: No headaches, seizures or dizziness reported.  G.I.: The patient denies any stomach upset today but reports constipation. Musculoskeletal: The patient denies any musculoskeletal related issues.   Time was spent today discussing with the patient her current symptoms. The patient reports that she is able to sleep reasonably well when she is allowed to take the medication Ambien.  The patient reports a decreased appetite and reports severe feelings of sadness, anhedonia and depressed mood.  The patient reports "constant" suicidal ideations but denies any plan or intent.  She also denies any auditory or visual hallucinations or delusional thinking.  The patient states that her anxiety symptoms are also severe today.  Ms. [name] states that she was admitted for evaluation and treatment of long standing depressive symptoms with suicidal ideations which have worsened over the last month.  Treatment Plan Summary:  1. Daily contact with patient  to assess and evaluate symptoms and progress in treatment.  2. Medication management  3. The patient will deny suicidal ideations or homicidal ideations for 48 hours prior to discharge and have a depression and  anxiety rating of 3 or less. The patient will also deny any auditory or visual hallucinations or delusional thinking.  4. The patient will deny any symptoms of substance withdrawal at time of discharge.   Plan:  1. Will continue the patient on the medication Adderall at 10 mgs po q 8 am and 2 pm for ADHD. 2. Will continue the patient on the medication Depakote ER at 1000 mgs po qhs for mood stabilization. 3. Will continue the patient on the medication Neurontin at 600 mgs po q am and hs for anxiety and to provide further mood stabilization.  4. Will continue the patient on the medication Lamictal at 150 mgs po qhs for mood stabilization. 5. Will continue the patient on the medication Ambien at 10 mgs po qhs for sleep. 6. Will continue the patient on the medication Latuda at 80 mgs po q 5 pm with food for mood stabilization and clarity of thought. 7. Will continue the patient on her non-psychiatric medications as listed above. 8. Will start the patient on Colace 100 mgs po qhs and will order a Ducolac Suppository now for constipation. 9. Laboratory Studies reviewed.  10. Will order a Free T3 and Free T4 as well as a repeat  11. Will continue to monitor.  SUICIDE RISK:  Moderate:  Frequent suicidal ideation with limited intensity, and duration, some specificity in terms of plans, no associated intent, good self-control, limited dysphoria/symptomatology, some risk factors present, and identifiable protective factors, including available and accessible social support.   [name] 10/27/2011, 3:44 PM

## 2011-10-27 NOTE — Progress Notes (Signed)
BHH Group Notes:  (Counselor/Nursing/MHT/Case Management/Adjunct)  10/27/2011 2:02 PM  Type of Therapy:  Group Therapy  Participation Level:  Did Not Attend     Tristan Proto 10/27/2011, 2:02 PM 

## 2011-10-27 NOTE — Progress Notes (Signed)
Psychoeducational Group Note  Date:  10/27/2011 Time:  2000  Group Topic/Focus:  Karaoke  Participation Level:  Did Not Attend  Participation Quality:  Did not attend  Affect:  Did not attend   Cognitive:  Did not attend  Insight:  Did not attend   Engagement in Group:  Did not attend   Additional Comments:    [name] A 10/27/2011, 5:42 AM

## 2011-10-27 NOTE — Progress Notes (Addendum)
10/27/2011         Time: 0930      Group Topic/Focus: The focus of this group is on promoting emotional and psychological well-being through the process of creative expression, relaxation, socialization, fun and enjoyment.  Participation Level: Active  Participation Quality: Appropriate and Attentive  Affect: Depressed  Cognitive: Alert  Additional Comments: Patient depressed, but appropriate throughout group. After group patient approached RT and became very tearful, patient reports she is stressed about school and has fallen behind in many of her classes. 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 Patient given daily workbook.    [name] 10/27/2011 12:05 PM

## 2011-10-27 NOTE — Discharge Planning (Addendum)
[name] did not attend AM group.  She sees Dr Nolen Mu, Madelaine Etienne at [facility] on [address] [facility] [place] [facility].

## 2011-10-27 NOTE — Treatment Plan (Signed)
Interdisciplinary Treatment Plan Update (Adult)  Date: 10/27/2011  Time Reviewed: 10:09 AM   Progress in Treatment: Attending groups: Yes Participating in groups: Yes Taking medication as prescribed: Yes Tolerating medication: Yes   Family/Significant other contact made: Not yet  Patient understands diagnosis:  Yes As evidenced by asking for help with SI, mood stabilization Discussing patient identified problems/goals with staff:  Yes  See below Medical problems stabilized or resolved:  Yes Denies suicidal/homicidal ideation: No  Contracts for safety Issues/concerns per patient self-inventory:  Yes  Poor appetite  Depression and hopelessness are 10's  Constant thoughts of suicide  C/O pain from injured list Other:  New problem(s) identified: N/A  Reason for Continuation of Hospitalization: Depression Medication stabilization SI  Interventions implemented related to continuation of hospitalization:  Recently started Jordan on outpt basis  Will continue  As well as adding anti depressant  Additional comments:  Estimated length of stay:3-4 days  Discharge Plan: return home, follow up outpt  New goal(s): N/A  Review of initial/current patient goals per problem list:   1.  Goal(s): Eliminate SI  Met:  No  Target date:9/30  As evidenced YN:[facility] report  2.  Goal (s): Stabilize mood  Met:  No  Target date:9/30  As evidenced by:[name] will report her hopelessness and depression at 4 or less  3.  Goal(s): Identify comprehensive mental wellness plan  Met:  No  Target date:9/30  As evidenced by: self report  4.  Goal(s):  Met:  No  Target date:  As evidenced by:  Attendees: Patient:  [name] 10/27/2011 10:09 AM  Family:     Physician:  Harvie Heck Readling 10/27/2011 10:09 AM   Nursing: Thayer Ohm Just   10/27/2011 10:09 AM   Case Manager:  Richelle Ito,  10/27/2011 10:09 AM   Counselor:  Veto Kemps 10/27/2011 10:09 AM   Other:     Other:     Other:     Other:       Scribe for Treatment Team:   Daryel Gerald B, 10/27/2011 10:09 AM

## 2011-10-27 NOTE — Progress Notes (Signed)
D) Pt verbalizes feelings of sadness, hopelessness and fear. States that she is in a horrible place and was fearful she would act on her feelings and attempt suicide. Admits to thoughts of SI presently and states that she is having constant thoughts of suicide. Contracts for safety in the hospital. Feels all her support systems are limited and she is not getting positive feedback from anywhere. Feels stressed in areas of school work,people in her classes, money and her mother. A) Given support, reassurance and praise. Encouraged to attend the groups on [address] hall and to get as much out of the program as she could. Given praise for coming to the hospital and not acting on her feelings.  R) Pt states that she will maintain her safety while in the hospital. States that she will go to the groups.

## 2011-10-27 NOTE — Progress Notes (Signed)
D: Pt is interactive on milieu; denies HI and A/V hallucinations; patient admits to SI and states that she does she had thoughts about getting into a car and running it into something that does not move; patient says that she wants to get her medications fixed so she will not have a "nervous breakdown";   A: Monitored q 15 minutes; encouraged to express any feelings and/or concerns; medication administration per physician orders . R: Pt interacts with peers and staff appropriately; patient is taking medications as prescribed and tolerating medications; patient is in the hallway interacting with staff and is very talkative;will continue to monitor

## 2011-10-28 LAB — T4, FREE: Free T4: 1.37 ng/dL (ref 0.80–1.80)

## 2011-10-28 LAB — T3, FREE: T3, Free: 3.1 pg/mL (ref 2.3–4.2)

## 2011-10-28 MED ORDER — DESVENLAFAXINE SUCCINATE ER 50 MG PO TB24
50.0000 mg | ORAL_TABLET | Freq: Every day | ORAL | Status: DC
  Administered 2011-10-28 – 2011-10-30 (×3): 50 mg via ORAL
  Filled 2011-10-28 (×4): qty 1

## 2011-10-28 NOTE — Progress Notes (Signed)
Pt reports struggling with somnolence from what she assumes is a side effect of Latuda. Says she napped a good bit today. Affect is flat with congruent mood. Denies any SI/HI or AVH. No pain or problems. Encouraged pt to speak with provider regarding latuda and its effects and also to give her body some time to adjust to med. Pt receptive. She remains safe on the unit. Lawrence Marseilles

## 2011-10-28 NOTE — Progress Notes (Signed)
Patient ID: [name], female   DOB: 03/19/1970, 41 y.o.   MRN: [id number]   [facility] Group Notes:  (Counselor/Nursing/MHT/Case Management/Adjunct)  10/28/2011 11 AM  Type of Therapy:  Aftercare Planning, Group Therapy, Dance/Movement Therapy   Participation Level:  Did Not Attend  Therapist checked in with pt individually. Pt recieved daily workbook and suicide prevention information.   Cassidi Long 10/28/2011. 11:18 AM
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
## 2011-10-28 NOTE — Progress Notes (Signed)
Patient ID: [name], female   DOB: 20-Jan-1971, 41 y.o.   MRN: [id number]  During pt's PSA the pt expressed that was experiencing SI and could not contract for safety.  Pt stated that if the counselor left the room, the pt would hit her head against the wall until she was no longer conscious.  Counselor then utilized call button to request assistance and consult with her supervisor.  Pt will meet with doctor in order to determine if a 1 to 1 is appropriate.  Pt was asked and escorted to the day room where she can be monitored for safety until she meets with the MD.

## 2011-10-28 NOTE — Progress Notes (Signed)
D: Pt in bed resting with eyes closed. Respirations even and unlabored. Pt appears to be in no signs of distress at this time. A: Q15min checks remains for this pt. R: Pt remains safe at this time.   

## 2011-10-28 NOTE — Progress Notes (Signed)
  [name] is a 41 y.o. female [id number] July 24, 1970  10/26/2011 Principal Problem:  *Major depressive disorder, recurrent episode Active Problems:  Generalized anxiety disorder  ADHD (attention deficit hyperactivity disorder), inattentive type  Borderline personality disorder   Mental Status: Mood depressed still reports SI no HI or AVH.    Subjective/Objective: Has always had SI and feels overwhelmed by school . Showed her [employer] on [employer] and reinforced all she has to do is pass. Now is conflicted about her degree. Reminded her that she is very close to graduating and being credentialed. Her mentor retires in May and whoever gets the position next may not be as understanding with [name]. Says today that if she could just get the depression controlled- said we could start Pristiq. Is considering a medical time out- but the financial ramifications are daunting.She can call financial aid office Monday to get accurate information.     Filed Vitals:   10/28/11 0701  BP: 103/73  Pulse: 101  Temp:   Resp:     Lab Results:   BMET    Component Value Date/Time   NA 139 10/25/2011 1325   K 4.3 10/25/2011 1325   CL 100 10/25/2011 1325   CO2 30 10/25/2011 1325   GLUCOSE 104* 10/25/2011 1325   BUN 9 10/25/2011 1325   CREATININE 0.89 10/25/2011 1325   CREATININE 0.83 09/11/2011 1524   CALCIUM 9.9 10/25/2011 1325   GFRNONAA 79* 10/25/2011 1325   GFRAA >90 10/25/2011 1325    Medications:  Scheduled:     . amphetamine-dextroamphetamine  10 mg Oral BH-q8a2p  . bisacodyl  10 mg Rectal Once  . desvenlafaxine  50 mg Oral Daily  . divalproex  1,000 mg Oral QHS  . docusate sodium  100 mg Oral QHS  . Gabapentin (PHN)  600 mg Oral BH-qamhs  . hydrochlorothiazide  25 mg Oral Daily  . lamoTRIgine  150 mg Oral QHS  . lurasidone  80 mg Oral q1800  . metoprolol succinate  100 mg Oral Daily  . zolpidem  10 mg Oral QHS  . DISCONTD: gabapentin  600 mg Oral BH-qamhs     PRN  Meds acetaminophen, alum & mag hydroxide-simeth, hydrOXYzine, magnesium hydroxide, SUMAtriptan  Start Pristiq 50 mg daily. Continue rest of plan.  [name],[name] 10/28/2011

## 2011-10-28 NOTE — Progress Notes (Signed)
D   Pt is sad and depressed today   She said right before lunch that she did not feel safe and if left alone she would hurt herself by hitting her head on something hard   Pt was instructed to stay with staff in the hall and dayroom until she could talk with the doctor for safety purposes and later aftwer talking to staff and doctor she was able to contract for safety  Her mood is labile  A   Verbal support given  Medications administered and effectiveness monitored  Q 15 min checks   Encourage coping skills and diversional activities R   Pt safe at present

## 2011-10-28 NOTE — Progress Notes (Signed)
Psychoeducational Group Note  Date:  10/28/2011 Time:  0945 am  Group Topic/Focus:  Identifying Needs:   The focus of this group is to help patients identify their personal needs that have been historically problematic and identify healthy behaviors to address their needs.  Participation Level:  Did Not Attend    [name] 10/28/2011,10:35 AM

## 2011-10-29 MED ORDER — CALCIUM POLYCARBOPHIL 625 MG PO TABS
625.0000 mg | ORAL_TABLET | Freq: Every day | ORAL | Status: DC
  Administered 2011-10-30: 625 mg via ORAL
  Filled 2011-10-29 (×2): qty 1

## 2011-10-29 MED ORDER — BISACODYL 5 MG PO TBEC
10.0000 mg | DELAYED_RELEASE_TABLET | Freq: Every evening | ORAL | Status: DC | PRN
  Administered 2011-10-29: 10 mg via ORAL
  Filled 2011-10-29 (×2): qty 1

## 2011-10-29 NOTE — Progress Notes (Signed)
Patient ID: [name], female   DOB: Jan 29, 1971, 41 y.o.   MRN: [id number]  [facility] Group Notes:  (Counselor/Nursing/MHT/Case Management/Adjunct)  10/29/2011 11 AM  Type of Therapy:  Aftercare Planning, Group Therapy, Dance/Movement Therapy   Participation Level:  Did Not Attend  Modes of Intervention:  Clarification, Problem-solving, Role-play, Socialization and Support  Summary of Progress/Problems: Pt. Did not attend aftercare planning group and counseling group on healthy support systems and how to support ourselves. Pt. accepted information on suicide prevention, warning signs to look for with suicide and crisis line numbers to use. The pt. agreed to call crisis line numbers if having warning signs or having thoughts of suicide. Pt received daily workbook.     Debarah Crape 10/29/2011. 11:33 AM

## 2011-10-29 NOTE — Progress Notes (Signed)
  [name] is a 41 y.o. female [id number] Jul 25, 1970  10/26/2011 Principal Problem:  *Major depressive disorder, recurrent episode Active Problems:  Generalized anxiety disorder  ADHD (attention deficit hyperactivity disorder), inattentive type  Borderline personality disorder   Mental Status: Mood and affect brighter more engaged in [place] has stopped thinking she needs a 1:1. Same SI that she frequently hads no HI or AVH.  Subjective/Objective: Started Pristiq for her depression. Has started problem solving- can get tutoring in [employer] and also use [employer]. Doesn't think taking a medical leave right now will help her as they only offer this course once  a year and that would mean another year before she finishes.Has much therapeutic support and feels she can pull herself together and finish the semester.     Filed Vitals:   10/29/11 0612  BP: 109/78  Pulse: 81  Temp:   Resp:     Lab Results:   BMET    Component Value Date/Time   NA 139 10/25/2011 1325   K 4.3 10/25/2011 1325   CL 100 10/25/2011 1325   CO2 30 10/25/2011 1325   GLUCOSE 104* 10/25/2011 1325   BUN 9 10/25/2011 1325   CREATININE 0.89 10/25/2011 1325   CREATININE 0.83 09/11/2011 1524   CALCIUM 9.9 10/25/2011 1325   GFRNONAA 79* 10/25/2011 1325   GFRAA >90 10/25/2011 1325    Medications:  Scheduled:     . amphetamine-dextroamphetamine  10 mg Oral BH-q8a2p  . desvenlafaxine  50 mg Oral Daily  . divalproex  1,000 mg Oral QHS  . docusate sodium  100 mg Oral QHS  . Gabapentin (PHN)  600 mg Oral BH-qamhs  . hydrochlorothiazide  25 mg Oral Daily  . lamoTRIgine  150 mg Oral QHS  . lurasidone  80 mg Oral q1800  . metoprolol succinate  100 mg Oral Daily  . zolpidem  10 mg Oral QHS     PRN Meds acetaminophen, alum & mag hydroxide-simeth, hydrOXYzine, magnesium hydroxide, SUMAtriptan Plan : continue current plan of care.             Noticed some dry skin on both wrists this am. ?contact  dermatitis? Don't think it is a reaction to Pristiq.   [name],[name] 10/29/2011

## 2011-10-29 NOTE — Progress Notes (Signed)
D   Pt has been making more hopeful statements  She can be needy at times and somewhat somatic  But is easily redirected   She has had some periods of anxiety during the day and has received medications for same   She contracts for safety and has been verbalizing greater insight A   Verbal support given   Encouraged pt to approach her issues one at a time   Medications administered and effectiveness monitored   Q 15 min checks R   Pt safe at present

## 2011-10-29 NOTE — Progress Notes (Signed)
[facility] Family/Significant Other Suicide Prevention Education  Suicide Prevention Education:  Patient Refusal for Family/Significant Other Suicide Prevention Education: The patient [name] has refused to provide written consent for family/significant other to be provided Family/Significant Other Suicide Prevention Education during admission and/or prior to discharge.  Physician notified.  Pt. accepted information on suicide prevention, warning signs to look for with suicide and crisis line numbers to use. The pt. agreed to call crisis line numbers if having warning signs or having thoughts of suicide.    [facility] 10/29/2011, 8:08 AM

## 2011-10-29 NOTE — Progress Notes (Signed)
Currently resting quietly in bed with eyes closed. Respirations are even and unlabored. No acute distress noted. Safety has been maintained with Q15 minute observation. Will continue Q15 minute observation and continue current POC. 

## 2011-10-29 NOTE — Progress Notes (Signed)
Pt found lying in bed with lights out during group time. Pt reports she is angry and is frustrated at herself for having these periods of unreasonable anger. Reports wanting to punch a wall to the point of breaking her hand. Processed at length. Pt calmer after 1:1 time. She agrees to contract for safety should she become angry again and will seek out staff. Pt med compliant, denies HI/AVH. Lawrence Marse[place]

## 2011-10-29 NOTE — Progress Notes (Signed)
Psychoeducational Group Note  Date:  10/29/2011 Time:  0945 am  Group Topic/Focus:  Making Healthy Choices:   The focus of this group is to help patients identify negative/unhealthy choices they were using prior to admission and identify positive/healthier coping strategies to replace them upon discharge.  Participation Level:  Active  Participation Quality:  Appropriate  Affect:  Anxious  Cognitive:  Alert  Insight:  Limited  Engagement in Group:  Limited  Additional Comments:  Pt attended group on the 500 hall  Andrena Mews 10/29/2011, 10:29 AM

## 2011-10-30 MED ORDER — CALCIUM POLYCARBOPHIL 625 MG PO TABS: 625.0000 mg | ORAL_TABLET | Freq: Every day | ORAL | Status: DC

## 2011-10-30 MED ORDER — ZOLPIDEM TARTRATE 10 MG PO TABS: 10.0000 mg | ORAL_TABLET | Freq: Every day | ORAL | Status: DC

## 2011-10-30 MED ORDER — LURASIDONE HCL 80 MG PO TABS: 80.0000 mg | ORAL_TABLET | Freq: Every day | ORAL | Status: DC

## 2011-10-30 MED ORDER — METOPROLOL SUCCINATE ER 100 MG PO TB24: 100.0000 mg | ORAL_TABLET | Freq: Every day | ORAL | Status: DC

## 2011-10-30 MED ORDER — DESVENLAFAXINE SUCCINATE ER 50 MG PO TB24: 50.0000 mg | ORAL_TABLET | Freq: Every day | ORAL | Status: DC

## 2011-10-30 MED ORDER — GABAPENTIN (ONCE-DAILY) 600 MG PO TABS: 600.0000 mg | ORAL_TABLET | ORAL | Status: DC

## 2011-10-30 MED ORDER — HYDROXYZINE HCL 50 MG PO TABS: 50.0000 mg | ORAL_TABLET | Freq: Two times a day (BID) | ORAL | Status: DC | PRN

## 2011-10-30 MED ORDER — LAMOTRIGINE 150 MG PO TABS: 150.0000 mg | ORAL_TABLET | Freq: Every day | ORAL | Status: DC

## 2011-10-30 MED ORDER — DIVALPROEX SODIUM ER 500 MG PO TB24: 1000.0000 mg | ORAL_TABLET | Freq: Every day | ORAL | Status: DC

## 2011-10-30 MED ORDER — ADULT MULTIVITAMIN W/MINERALS CH: 1.0000 | ORAL_TABLET | Freq: Every day | ORAL | Status: DC

## 2011-10-30 MED ORDER — AMPHETAMINE-DEXTROAMPHETAMINE 10 MG PO TABS: 10.0000 mg | ORAL_TABLET | ORAL | Status: DC

## 2011-10-30 MED ORDER — HYDROCHLOROTHIAZIDE 25 MG PO TABS: 25.0000 mg | ORAL_TABLET | Freq: Every day | ORAL | Status: DC

## 2011-10-30 NOTE — Discharge Summary (Signed)
Physician Discharge Summary Note  Patient:  [name] is an 41 y.o., female MRN:  [id number] DOB:  11-19-1970 Patient phone:  [phone] (home)  Patient address:   [address] [place] [address]   Date of Admission:  10/26/2011 Date of Discharge: 10/30/2011  Discharge Diagnoses: Principal Problem:  *Major depressive disorder, recurrent episode Active Problems:  Generalized anxiety disorder  ADHD (attention deficit hyperactivity disorder), inattentive type  Borderline personality disorder  Axis Diagnosis:  AXIS I: Major depressive disorder recurrent,  Generalized anxiety disorder  Attention deficit hyperactivity disorder - Inattentive type.  AXIS II: Borderline Personality Disorder.  AXIS III: 1. Eczema.  2. Pes Planus.  3. Achilles Tendinitis.  4. Hypertension.  5. Migraine Headaches.  6. Ganglion Cyst - Left Wrist.  7. Seasonal Allergies.  8. Lipoma.  9. Hyperprolactinemia.  AXIS IV: Chronic Mental Illness. Multiple Non-psychiatric Medical Issues. School Related Stresses. Family Discord.  AXIS V: GAF at time of admission approximately 35. GAF at time of discharge approximately 55.   Level of Care:  Inpatient Hospitalization.  Reason for Admission: Has started seeing Dr.Parrish Nolen Mu who is stabilizing her on Jordan. Dose just increased to 80 mg at supper-needs to take with at least 300 calories. Has been having increased racing thoughts Mood swings anger outburts poor sleep is also failing her physiology course.  Ms. Dillahunt is well known to our service and has managed to stay out of the hospital for 11 months. She is well know to this Clinical research associate and wants to know how long she will need to be inpatient.  Advised that the easiest med adjustment is her Depakote and we will check her level in the am. She has just had one dose of the 80 mg Latuda. Got frustrated in the ED last light and hit the walls injuring her R arm.Is agreeable to being moved to Dr.Shandria Clinch 's  care as Dr. Nolen Mu wants to collaborate on Ms. Lybarger' meds. She continues to benefit from DBT with Dr.Katherine Sherrine Maples and a therapist at [facility].  Hospital Course:   The patient attended treatment team meeting this am and met with treatment team members. The patient's symptoms, treatment plan and response to treatment was discussed. The patient endorsed that their symptoms have improved. The patient also stated that they felt stable for discharge.  They reported that from this hospital stay they had learned many coping skills.  In other to maintain their psychiatric stability, they will continue psychiatric care on an outpatient basis. They will follow-up as outlined below.  In addition they were instructed  to take all your medications as prescribed by their mental healthcare provider and to report any adverse effects and or reactions from your medicines to their outpatient provider promptly.  The patient is also instructed and cautioned to not engage in alcohol and or illegal drug use while on prescription medicines.  In the event of worsening symptoms the patient is instructed to call the crisis hotline, 911 and or go to the nearest ED for appropriate evaluation and treatment of symptoms.   Also while a patient in this hospital, the patient received medication management for his psychiatric symptoms. They were ordered and received as outlined below:    Medication List     As of 10/30/2011  3:38 PM    STOP taking these medications         amphetamine-dextroamphetamine 5 MG tablet   Commonly known as: ADDERALL      divalproex 500 MG DR  tablet   Commonly known as: DEPAKOTE      hydrOXYzine 50 MG capsule   Commonly known as: VISTARIL      IMITREX IJ      promethazine 25 MG tablet   Commonly known as: PHENERGAN      VITAMIN B-12 PO      vitamin C 500 MG tablet   Commonly known as: ASCORBIC ACID      Vitamin D-3 1000 UNITS Caps      TAKE these medications       Indication    amphetamine-dextroamphetamine 10 MG tablet   Commonly known as: ADDERALL   Take 1 tablet (10 mg total) by mouth 2 (two) times daily at 8am and 2pm. For attention issues.       desvenlafaxine 50 MG 24 hr tablet   Commonly known as: PRISTIQ   Take 1 tablet (50 mg total) by mouth daily. For depression.       divalproex 500 MG 24 hr tablet   Commonly known as: DEPAKOTE ER   Take 2 tablets (1,000 mg total) by mouth at bedtime. For mood stabilization.       Gabapentin (PHN) 600 MG Tabs   Take 600 mg by mouth 2 (two) times daily in the am and at bedtime.. For neuropathic pain.       hydrochlorothiazide 25 MG tablet   Commonly known as: HYDRODIURIL   Take 1 tablet (25 mg total) by mouth daily. For blood pressure control.       hydrOXYzine 50 MG tablet   Commonly known as: ATARAX/VISTARIL   Take 1 tablet (50 mg total) by mouth 2 (two) times daily as needed for anxiety.       lamoTRIgine 150 MG tablet   Commonly known as: LAMICTAL   Take 1 tablet (150 mg total) by mouth at bedtime. For mood stabilization.       lurasidone 80 MG Tabs   Commonly known as: LATUDA   Take 1 tablet (80 mg total) by mouth daily at 6 PM. For psychosis and mood stabilization.       metoprolol succinate 100 MG 24 hr tablet   Commonly known as: TOPROL-XL   Take 1 tablet (100 mg total) by mouth daily. Take with or immediately following a meal for blood pressure control.       multivitamin with minerals Tabs   Take 1 tablet by mouth daily. For nutritional supplementation.       polycarbophil 625 MG tablet   Commonly known as: FIBERCON   Take 1 tablet (625 mg total) by mouth daily. As a stool softener.       zolpidem 10 MG tablet   Commonly known as: AMBIEN   Take 1 tablet (10 mg total) by mouth at bedtime. For sleep.        They were also enrolled in group counseling sessions and activities in which they participated actively.       Follow-up Information    Follow up with Cherylann Banas.  On 11/01/2011. (4:15 on Wed   I called several times to try to reschedule this.  They were not answering the phone, so I left a message indicating you wanted to see Dr Nolen Mu.  You will need to follow up with a call to see if you can change it.)    Contact information:   [address]  [place]  [phone]      Follow up with [facility]. On 11/02/2011.  Contact information:   [address]  [place]  [phone]      Follow up with [facility]. On 11/03/2011.   Contact information:   [address]  [place]  [phone]        Upon discharge, patient adamantly denies suicidal, homicidal ideations, auditory, visual hallucinations and or delusional thinking. They left [facility] with all personal belongings via personal transportation in no apparent distress.  Consults:  Please see electronic medical record for details.  Significant Diagnostic Studies:  Please see electronic medical record for details.  Discharge Vitals:   Blood pressure 117/81, pulse 92, temperature 98.1 F (36.7 C), temperature source Oral, resp. rate 19, height 5\' 10"  (1.778 m), weight 96.616 kg (213 lb), last menstrual period 10/16/2011..  Mental Status Exam: Demographic Factors:  Adolescent or young adult, Caucasian and Unemployed  Mental Status Per Nursing Assessment::  On Admission:  At Time of Discharge: Time was spent today discussing with the patient her current symptoms. The patient reports that she is able to sleep well and reports a much improved appetite. The patient reports mild feelings of sadness, anhedonia and depressed mood. She adamantly denies any suicidal or homicidal ideations today. She also denies any auditory or visual hallucinations or delusional thinking. The patient states that her anxiety symptoms are also mild today. The patient denies any medication related side effects or other concerns.  The patient states that she feels ready for  discharge today where she can return to school tomorrow and this will be arranged.  Current Mental Status Per Physician:  Diagnosis:  Axis I: Major depressive disorder recurrent,  generalized anxiety disorder, attention deficit hyperactivity disorder  inattentive type.  Axis II: Borderline personality disorder.  The patient was seen today and reports the following:  ADL's: Intact.  Sleep: The patient reports to sleeping well when she is given the medication Ambien at night.  Appetite: The patient reports that her appetite is decreased today.  Mild>(1-10) >Severe  Hopelessness (1-10): 0  Depression (1-10): 3  Anxiety (1-10): 3  Suicidal Ideation: The patient adamantly denies any suicidal or homicidal ideations today.  Plan: No  Intent: No  Means: No  Homicidal Ideation: The patient adamantly denies any homicidal ideations today.  Plan: No  Intent: No.  Means: No  General Appearance/Behavior: The patient was friendly and cooperative today with this provider with minimal depression.  Eye Contact: Good.  Speech: Appropriate in rate and volume with no pressuring of speech noted today.  Motor Behavior: wnl.  Level of Consciousness: Alert and Oriented x 3.  Mental Status: Alert and Oriented x 3.  Mood: Mild depression reported today.  Affect: Essentially bright and full.  Anxiety Level: Mild anxiety reported today.  Thought Process: The patient denies any auditory or visual hallucinations today or delusional thinking.  Thought Content: The patient denies any auditory or visual hallucinations today or delusional thinking.  Perception: The patient denies any auditory or visual hallucinations today or delusional thinking.  Judgment: Fair to Good.  Insight: Fair to Good.  Cognition: Oriented to person, place and time.  Loss Factors:  School Related Stresses.  Historical Factors:  Chronic Mental Illness. Multiple Non-psychiatric Medical Issues. School Related Stresses. Family Discord.    Risk Reduction Factors:  The patient is educated about her condition. Good access to community services. Established with Psychiatrist and Therapists.  Continued Clinical Symptoms:  Depression: Anhedonia  Personality Disorders: Cluster B  More than one psychiatric diagnosis  Previous Psychiatric Diagnoses and Treatments  Medical Diagnoses and Treatments/Surgeries   Discharge Diagnoses:  AXIS I: Major depressive disorder recurrent,  Generalized anxiety disorder  Attention deficit hyperactivity disorder - Inattentive type.  AXIS II: Borderline Personality Disorder.  AXIS III: 1. Eczema.  2. Pes Planus.  3. Achilles Tendinitis.  4. Hypertension.  5. Migraine Headaches.  6. Ganglion Cyst - Left Wrist.  7. Seasonal Allergies.  8. Lipoma.  9. Hyperprolactinemia.  AXIS IV: Chronic Mental Illness. Multiple Non-psychiatric Medical Issues. School Related Stresses. Family Discord.  AXIS V: GAF at time of admission approximately 35. GAF at time of discharge approximately 55.   Cognitive Features That Contribute To Risk:  Thought constriction (tunnel vision)   Current Medications:  amphetamine-dextroamphetamine  10 mg  Oral  [id number]   bisacodyl  10 mg  Rectal  Once   divalproex  1,000 mg  Oral  QHS   docusate sodium  100 mg  Oral  QHS   gabapentin  600 mg  Oral  BH-qamhs   hydrochlorothiazide  25 mg  Oral  Daily   influenza inactive virus vaccine  0.5 mL  Intramuscular  Tomorrow-1000   lamoTRIgine  150 mg  Oral  QHS   lurasidone  80 mg  Oral  q1800   metoprolol succinate  100 mg  Oral  Daily   zolpidem  10 mg  Oral  QHS    Review of Systems:  Neurological: No headaches, seizures or dizziness reported.  G.I.: The patient denies any stomach upset today or constipation.  Musculoskeletal: The patient denies any musculoskeletal related issues.   Time was spent today discussing with the patient her current symptoms. The patient reports that she is able to sleep well and reports a much  improved appetite. The patient reports mild feelings of sadness, anhedonia and depressed mood. She adamantly denies any suicidal or homicidal ideations today. She also denies any auditory or visual hallucinations or delusional thinking. The patient states that her anxiety symptoms are also mild today. The patient denies any medication related side effects or other concerns.  The patient states that she feels ready for discharge today where she can return to school tomorrow and this will be arranged.   Treatment Plan Summary:  1. Daily contact with patient to assess and evaluate symptoms and progress in treatment.  2. Medication management  3. The patient will deny suicidal ideations or homicidal ideations for 48 hours prior to discharge and have a depression and anxiety rating of 3 or less. The patient will also deny any auditory or visual hallucinations or delusional thinking.  4. The patient will deny any symptoms of substance withdrawal at time of discharge.   Plan:  1. Will continue the patient on the medication Adderall at 10 mgs po q 8 am and 2 pm for ADHD.  2. Will continue the patient on the medication Depakote ER at 1000 mgs po qhs for mood stabilization.  3. Will continue the patient on the medication Neurontin at 600 mgs po q am and hs for anxiety and to provide further mood stabilization.  4. Will continue the patient on the medication Lamictal at 150 mgs po qhs for mood stabilization.  5. Will continue the patient on the medication Ambien at 10 mgs po qhs for sleep.  6. Will continue the patient on the medication Latuda at 80 mgs po q 5 pm with food for mood stabilization and clarity of thought.  7. Will continue the patient on the medication Pristiq  at 50 mgs po q am for depression.  8. Will continue the patient on her non-psychiatric medications as listed above.  9. Laboratory Studies reviewed.  10. Will continue to monitor.  11. The patient will be discharged today as requested to  outpatient follow up.   Suicide Risk:  Minimal: No identifiable suicidal ideation. Patients presenting with no risk factors but with morbid ruminations; may be classified as minimal risk based on the severity of the depressive symptoms   Plan Of Care/Follow-up recommendations:  Activity: As tolerated.  Diet: Heart Healthy Diet.  Other: Please take all medications only as directed and keep all scheduled follow up appointments. Please return to your local Emergency Room should you have any thoughts of harming yourself or others.  Discharge destination:  Home  Is patient on multiple antipsychotic therapies at discharge:  No  Has Patient had three or more failed trials of antipsychotic monotherapy by history: N/A Recommended Plan for Multiple Antipsychotic Therapies: N/A Discharge Orders    Future Appointments: Provider: Department: Dept Phone: Center:   11/02/2011 11:00 AM Ralene Cork, DO [facility] [phone] [facility]     Future Orders Please Complete By Expires   Diet - low sodium heart healthy      Increase activity slowly      Discharge instructions      Comments:   Please take all medications only as directed and keep all scheduled follow up appointments.  Please return to your local Emergency Room should you have any thoughts of harming yourself or others.       Medication List     As of 10/30/2011  3:38 PM    STOP taking these medications         amphetamine-dextroamphetamine 5 MG tablet   Commonly known as: ADDERALL      divalproex 500 MG DR tablet   Commonly known as: DEPAKOTE      hydrOXYzine 50 MG capsule   Commonly known as: VISTARIL      IMITREX IJ      promethazine 25 MG tablet   Commonly known as: PHENERGAN      VITAMIN B-12 PO      vitamin C 500 MG tablet   Commonly known as: ASCORBIC ACID      Vitamin D-3 1000 UNITS Caps      TAKE these medications      Indication    amphetamine-dextroamphetamine 10 MG tablet   Commonly known as:  ADDERALL   Take 1 tablet (10 mg total) by mouth 2 (two) times daily at 8am and 2pm. For attention issues.       desvenlafaxine 50 MG 24 hr tablet   Commonly known as: PRISTIQ   Take 1 tablet (50 mg total) by mouth daily. For depression.       divalproex 500 MG 24 hr tablet   Commonly known as: DEPAKOTE ER   Take 2 tablets (1,000 mg total) by mouth at bedtime. For mood stabilization.       Gabapentin (PHN) 600 MG Tabs   Take 600 mg by mouth 2 (two) times daily in the am and at bedtime.. For neuropathic pain.       hydrochlorothiazide 25 MG tablet   Commonly known as: HYDRODIURIL   Take 1 tablet (25 mg total) by mouth daily. For blood pressure control.       hydrOXYzine 50 MG tablet   Commonly known as: ATARAX/VISTARIL   Take 1 tablet (50 mg total) by mouth 2 (  two) times daily as needed for anxiety.       lamoTRIgine 150 MG tablet   Commonly known as: LAMICTAL   Take 1 tablet (150 mg total) by mouth at bedtime. For mood stabilization.       lurasidone 80 MG Tabs   Commonly known as: LATUDA   Take 1 tablet (80 mg total) by mouth daily at 6 PM. For psychosis and mood stabilization.       metoprolol succinate 100 MG 24 hr tablet   Commonly known as: TOPROL-XL   Take 1 tablet (100 mg total) by mouth daily. Take with or immediately following a meal for blood pressure control.       multivitamin with minerals Tabs   Take 1 tablet by mouth daily. For nutritional supplementation.       polycarbophil 625 MG tablet   Commonly known as: FIBERCON   Take 1 tablet (625 mg total) by mouth daily. As a stool softener.       zolpidem 10 MG tablet   Commonly known as: AMBIEN   Take 1 tablet (10 mg total) by mouth at bedtime. For sleep.            Follow-up Information    Follow up with Cherylann Banas. On 11/01/2011. (4:15 on Wed   I called several times to try to reschedule this.  They were not answering the phone, so I left a message indicating you wanted to see Dr Nolen Mu.  You will  need to follow up with a call to see if you can change it.)    Contact information:   [address]  [place]  [phone]      Follow up with [facility]. On 11/02/2011.   Contact information:   [address]  [place]  [phone]      Follow up with [facility]. On 11/03/2011.   Contact information:   [address]  [place]  [phone]        Follow-up recommendations:   Activities: Resume typical activities Diet: Resume typical diet Other: Follow up with outpatient provider and report any side effects to out patient prescriber.  Comments:  Take all your medications as prescribed by your mental healthcare provider. Report any adverse effects and or reactions from your medicines to your outpatient provider promptly. Patient is instructed and cautioned to not engage in alcohol and or illegal drug use while on prescription medicines. In the event of worsening symptoms, patient is instructed to call the crisis hotline, 911 and or go to the nearest ED for appropriate evaluation and treatment of symptoms. Follow-up with your primary care provider for your other medical issues, concerns and or health care needs.  Signed: Franchot Gallo 10/30/2011 3:38 PM

## 2011-10-30 NOTE — Progress Notes (Signed)
D: Patient self inventory sheet states: sleep is fair, appetite is good, depression 3/10, hopeless 2/10, off and on SI, (patient is SI contracted for safety), no HI or A/V hallucinations, pain 5-6 out of 10 and anxiety level 4/10. Patient mood appears bright and pleasant. No signs of distress.   A: Continue to encourage patient to attend groups, verbal support given, 15 minute checks performed routinely and  medications administered as ordered per MD.  R: Patient is safe and states she is ready to be discharged home. Patient states on self inventory sheet what changes she can make when she goes home. Patient states " prioritize tasks, take time for me, do not take on too much at once". Patient feels that overall she is doing much better. Patient denied SI and HI during treatment team today but stated anxiety level is 3/10.

## 2011-10-30 NOTE — [facility] Suicide Risk Assessment (Signed)
Suicide Risk Assessment  Discharge Assessment     Demographic Factors:  Adolescent or young adult, Caucasian and Unemployed  Mental Status Per Nursing Assessment::   On Admission:    At Time of Discharge:  Time was spent today discussing with the patient her current symptoms. The patient reports that she is able to sleep well and reports a much improved appetite.  The patient reports mild feelings of sadness, anhedonia and depressed mood.  She adamantly denies any suicidal or homicidal ideations today.  She also denies any auditory or visual hallucinations or delusional thinking. The patient states that her anxiety symptoms are also mild today.  The patient denies any medication related side effects or other concerns.  The patient states that she feels ready for discharge today where she can return to school tomorrow and this will be arranged.  Current Mental Status Per Physician:  Diagnosis:  Axis I: Major depressive disorder recurrent,  generalized anxiety disorder, attention deficit hyperactivity disorder  inattentive type.  Axis II: Borderline personality disorder.   The patient was seen today and reports the following:   ADL's: Intact.  Sleep: The patient reports to sleeping well when she is given the medication Ambien at night.  Appetite: The patient reports that her appetite is decreased today.   Mild>(1-10) >Severe  Hopelessness (1-10): 0 Depression (1-10): 3 Anxiety (1-10): 3  Suicidal Ideation: The patient adamantly denies any suicidal or homicidal ideations today. Plan: No  Intent: No  Means: No   Homicidal Ideation: The patient adamantly denies any homicidal ideations today.  Plan: No  Intent: No.  Means: No   General Appearance/Behavior: The patient was friendly and cooperative today with this provider with minimal depression.  Eye Contact: Good.  Speech: Appropriate in rate and volume with no pressuring of speech noted today.  Motor Behavior: wnl.  Level of  Consciousness: Alert and Oriented x 3.  Mental Status: Alert and Oriented x 3.  Mood: Mild depression reported today.  Affect: Essentially bright and full.  Anxiety Level: Mild anxiety reported today.  Thought Process: The patient denies any auditory or visual hallucinations today or delusional thinking.  Thought Content: The patient denies any auditory or visual hallucinations today or delusional thinking.  Perception: The patient denies any auditory or visual hallucinations today or delusional thinking.  Judgment: Fair to Good.  Insight: Fair to Good.  Cognition: Oriented to person, place and time.   Loss Factors: School Related Stresses.  Historical Factors: Chronic Mental Illness.  Multiple Non-psychiatric Medical Issues.  School Related Stresses.  Family Discord.  Risk Reduction Factors:   The patient is educated about her condition.  Good access to community services.  Established with Psychiatrist and Therapists.  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  AXIS II:   Borderline Personality Disorder. AXIS III:   1.  Eczema.   2.  Pes Planus.   3.  Achilles Tendinitis.   4.  Hypertension.   5.  Migraine Headaches.   6.  Ganglion Cyst - Left Wrist.   7.  Seasonal Allergies.   8.  Lipoma.   9.  Hyperprolactinemia. AXIS IV:   Chronic Mental Illness.  Multiple Non-psychiatric Medical Issues.  School Related Stresses.  Family Discord. AXIS V:   GAF at time of admission approximately 35.  GAF at time of discharge approximately 55.  Cognitive Features That Contribute To Risk:  Thought constriction (tunnel vision)    Current Medications:    amphetamine-dextroamphetamine  10 mg  Oral  [id number]   bisacodyl  10 mg  Rectal  Once   divalproex  1,000 mg  Oral  QHS   docusate sodium  100 mg  Oral  QHS   gabapentin  600 mg  Oral  BH-qamhs   hydrochlorothiazide  25 mg  Oral  Daily   influenza inactive virus vaccine  0.5 mL  Intramuscular  Tomorrow-1000   lamoTRIgine  150 mg  Oral  QHS   lurasidone  80 mg  Oral  q1800   metoprolol succinate  100 mg  Oral  Daily   zolpidem  10 mg  Oral  QHS    Review of Systems:  Neurological: No headaches, seizures or dizziness reported.  G.I.: The patient denies any stomach upset today or constipation. Musculoskeletal: The patient denies any musculoskeletal related issues.   Time was spent today discussing with the patient her current symptoms. The patient reports that she is able to sleep well and reports a much improved appetite.  The patient reports mild feelings of sadness, anhedonia and depressed mood. She adamantly denies any suicidal or homicidal ideations today.  She also denies any auditory or visual hallucinations or delusional thinking. The patient states that her anxiety symptoms are also mild today.  The patient denies any medication related side effects or other concerns.  The patient states that she feels ready for discharge today where she can return to school tomorrow and this will be arranged.  Treatment Plan Summary:  1. Daily contact with patient to assess and evaluate symptoms and progress in treatment.  2. Medication management  3. The patient will deny suicidal ideations or homicidal ideations for 48 hours prior to discharge and have a depression and anxiety rating of 3 or less. The patient will also deny any auditory or visual hallucinations or delusional thinking.  4. The patient will deny any symptoms of substance withdrawal at time of discharge.   Plan:  1. Will continue the patient on the medication Adderall at 10 mgs po q 8 am and 2 pm for ADHD.  2. Will continue the  patient on the medication Depakote ER at 1000 mgs po qhs for mood stabilization.  3. Will continue the patient on the medication Neurontin at 600 mgs po q am and hs for anxiety and to provide further mood stabilization.  4. Will continue the patient on the medication Lamictal at 150 mgs po qhs for mood stabilization.  5. Will continue the patient on the medication Ambien at 10 mgs po qhs for sleep.  6. Will continue the patient on the medication Latuda at 80 mgs po q 5 pm with food for mood stabilization and clarity of thought.  7. Will continue the patient on the medication Pristiq at 50 mgs po q am for depression. 8. Will continue the patient on her non-psychiatric medications as listed above.  9. Laboratory Studies reviewed.  10. Will continue to monitor. 11. The patient will be discharged today as requested to outpatient follow up.  Suicide Risk:  Minimal: No identifiable suicidal ideation.  Patients presenting with no risk factors but with morbid ruminations; may be classified as minimal risk based on the severity of the depressive symptoms  Plan Of Care/Follow-up recommendations:  Activity:  As tolerated. Diet:  Heart Healthy Diet. Other:  Please take all medications only as directed and keep all scheduled follow up appointments.  Please return to your local Emergency Room should you have any thoughts of harming yourself or others.  [name] 10/30/2011, 11:38 AM

## 2011-10-30 NOTE — Progress Notes (Signed)
Discharge note: Patient discharged to home, transported by the patient's mother. Patient denied SI and HI and no A/V hallucinations. Patient received all belongings from locker along with sample medications. Patient reviewed and signed AVS. Patient has F/U appointment with Dr. Emerson Monte on 11/07/11 at 0845 for treatment.

## 2011-10-30 NOTE — Progress Notes (Signed)
[facility] Case Management Discharge Plan:  Will you be returning to the same living situation after discharge: Yes,  home At discharge, do you have transportation home?:Yes,  mother Do you have the ability to pay for your medications:Yes,  insurance  Interagency Information:     Release of information consent forms completed and in the chart;  Patient's signature needed at discharge.  Patient to Follow up at:  Follow-up Information    Follow up with Cherylann Banas. On 11/01/2011. (4:15 on Wed)    Contact information:   [address]  [place]  [phone]      Follow up with [facility]. On 11/02/2011.   Contact information:   [address]  [place]  [phone]      Follow up with [facility]. On 11/03/2011.   Contact information:   [address]  [place]  [[phone]         Patient denies SI/HI:   Yes,  yes    Safety Planning and Suicide Prevention discussed:  Yes,  yes  Barrier to discharge identified:No.  Summary and Recommendations:   [name] 10/30/2011, 12:02 PM

## 2011-10-30 NOTE — Progress Notes (Signed)
Psychoeducational Group Note  Date:  10/30/2011 Time:  1100  Group Topic/Focus:  Self Care:   The focus of this group is to help patients understand the importance of self-care in order to improve or restore emotional, physical, spiritual, interpersonal, and financial health.  Participation Level: Did Not Attend  Participation Quality:  Not Applicable  Affect:  Not Applicable  Cognitive:  Not Applicable  Insight:  Not Applicable  Engagement in Group: Not Applicable  Additional Comments:  Pt was asleep and unable to attend group this morning.  Gurjit Loconte E 10/30/2011, 1:40 PM

## 2011-10-31 NOTE — Progress Notes (Signed)
Patient Discharge Instructions:  After Visit Summary (AVS):   Faxed to:  10/31/2011 Psychiatric Admission Assessment Note:   Faxed to:  10/31/2011 Suicide Risk Assessment - Discharge Assessment:   Faxed to:  10/31/2011 Faxed/Sent to the Next Level Care provider:  10/31/2011  Faxed to [facility] - [facility] @ [phone], Eduard Clos, 10/31/2011, 1:37 PM

## 2011-11-01 ENCOUNTER — Encounter: Payer: Self-pay | Admitting: Gynecology

## 2011-11-01 ENCOUNTER — Ambulatory Visit (INDEPENDENT_AMBULATORY_CARE_PROVIDER_SITE_OTHER): Payer: BC Managed Care – PPO | Admitting: Gynecology

## 2011-11-01 VITALS — BP 128/86

## 2011-11-01 DIAGNOSIS — N9089 Other specified noninflammatory disorders of vulva and perineum: Secondary | ICD-10-CM

## 2011-11-01 DIAGNOSIS — N898 Other specified noninflammatory disorders of vagina: Secondary | ICD-10-CM

## 2011-11-01 LAB — WET PREP FOR TRICH, YEAST, CLUE
Clue Cells Wet Prep HPF POC: NONE SEEN
Trich, Wet Prep: NONE SEEN
WBC, Wet Prep HPF POC: NONE SEEN
Yeast Wet Prep HPF POC: NONE SEEN

## 2011-11-01 MED ORDER — FLUCONAZOLE 100 MG PO TABS: 100.0000 mg | ORAL_TABLET | Freq: Every day | ORAL | Status: DC

## 2011-11-01 MED ORDER — LIDOCAINE HCL 2 % EX GEL
CUTANEOUS | Status: DC | PRN
Start: 2011-11-01 — End: 2011-12-18

## 2011-11-01 NOTE — Progress Notes (Signed)
Patient is a 41 year old who presented to the office today complaining of some vulvar irritation. She was concerned because she had been self stimulating herself vigorously in the clitoral region. She also thought that she might have slight discharge and a questionable gallbladder labia majora. Patient is not sexually active. Pelvic exam: Bartholin urethra Skene was within normal limits Left labia majora inferior region a slightly elevated nodule that when pressure was applied clear fluid came out questionable HSV versus small labial cyst. The rest of the airways non-erythematous it was minimally tender. No other lesions were noted. The clitoris was intact with no erosion. Her vagina was otherwise unremarkable wet prep was done the was no lesions on the cervix seen.  Wet prep essentially unremarkable few bacteria were noted.  Assessment/plan: HSV culture of the inferior portion of the left labia majora was obtained. Prescription for 2% lidocaine to apply when necessary. Patient will do sitz baths every night. We'll wait for the results of the HSV culture. I've given her a prescription of Diflucan 100 mg to take 1 by mouth today in the event that the yield from the wet prep was low. If the culture come back negative in the next couple weeks this area does not resolve she will return back to the office for possible biopsy.

## 2011-11-01 NOTE — Patient Instructions (Addendum)
Prescription ready to be picked up 

## 2011-11-02 ENCOUNTER — Ambulatory Visit (INDEPENDENT_AMBULATORY_CARE_PROVIDER_SITE_OTHER): Payer: BC Managed Care – PPO | Admitting: Sports Medicine

## 2011-11-02 VITALS — BP 118/74 | Ht 70.0 in | Wt 213.0 lb

## 2011-11-02 DIAGNOSIS — S5010XA Contusion of unspecified forearm, initial encounter: Secondary | ICD-10-CM

## 2011-11-02 DIAGNOSIS — M79639 Pain in unspecified forearm: Secondary | ICD-10-CM

## 2011-11-02 DIAGNOSIS — M79609 Pain in unspecified limb: Secondary | ICD-10-CM

## 2011-11-02 MED ORDER — METHYLPREDNISOLONE ACETATE 80 MG/ML IJ SUSP
80.0000 mg | Freq: Once | INTRAMUSCULAR | Status: AC
  Administered 2011-11-02: 80 mg via INTRAMUSCULAR

## 2011-11-03 NOTE — Progress Notes (Signed)
  Subjective:    Patient ID: [name], female    DOB: [birth year], 41 y.o.   MRN: [id number]  HPI Patient comes in today for followup on right toe numbness. Numbness has improved dramatically with orthotics. She would like a new metatarsal pad as the ones that she has are coming off. She was recently admitted for psychiatric care. Please see note dated 10/26/2011 for details. Patient states that during that admission she struck her right forearm against the wall. Immediate pain. Arm was x-rayed and no fracture was seen. She's complaining of pain along the ulnar aspect of her forearm were she struck that wall. She also complaining of diffuse right shoulder pain. Status post right shoulder arthroscopy with Dr. Eulah Pont several months ago. She denies any new trauma here.    Review of Systems     Objective:   Physical Exam No acute distress Right shoulder shows full range of motion with a positive painful ARC. Good rotator cuff strength. Right forearm shows tenderness and mild swelling along the ulnar aspect of the distal forearm. Full wrist range of motion but this does reproduce some pain. She is neurovascular intact distally.       Assessment & Plan:  1. Right shoulder pain likely secondary to mild rotator cuff strain, status post right shoulder arthroscopy done by Dr. Eulah Pont earlier this year 2. Right forearm contusion 3. History of depression  We will give the patient new metatarsal pads for her shoes. Depo-Medrol IM inject for both her forearm and her right shoulder. 80 mg IM was injected. Cockup wrist brace for her forearm contusion. She can wear this for comfort only and wean from it as pain improves. She will followup with me in one month.

## 2011-11-15 ENCOUNTER — Ambulatory Visit (INDEPENDENT_AMBULATORY_CARE_PROVIDER_SITE_OTHER): Payer: BC Managed Care – PPO | Admitting: Sports Medicine

## 2011-11-15 VITALS — BP 110/78 | Ht 70.0 in | Wt 213.0 lb

## 2011-11-15 DIAGNOSIS — M79609 Pain in unspecified limb: Secondary | ICD-10-CM

## 2011-11-15 DIAGNOSIS — M79676 Pain in unspecified toe(s): Secondary | ICD-10-CM

## 2011-11-15 NOTE — Progress Notes (Signed)
  Subjective:    Patient ID: [name], female    DOB: 03/29/1970, 41 y.o.   MRN: [id number]  HPI chief complaint: Right great toe pain  [name] comes in today having injured her right great toe a few days ago. She suffered an axial loading injury to the toe when she kicked a stump. She has pain at the first MTP. She has had pain here before. She has a history of osteoarthritis and bone spur and has had cortisone injections in the past. She has found it uncomfortable to wear her metatarsal pads since her injury so she has removed them from her shoes.    Review of Systems     Objective:   Physical Exam Well-developed and well-nourished. No acute distress. Awake alert and oriented x3  Right foot with attention to the right great toe: Mild soft tissue swelling and joint effusion at the first MTP joint. Limited active and passive range of motion suggesting hallux rigidus. There is no ecchymosis. No clinical angulation or malrotation. No tenderness along the first metatarsal more proximally nor along the great toe. No subungual hematoma. Flexor and extensor tendons are intact. Brisk capillary refill. She walks with a slight limp.  MSK ultrasound of the right foot with attention to the right toe shows what may be a small chip fracture at the MTP joint. Alternatively, this could be a dorsal bone spur. Mild joint effusion. Flexor and extensor tendons are intact.       Assessment & Plan:  1. Right great toe pain secondary to MTP sprain with underlying osteoarthritis  Ultrasound suggest a possible small avulsion fracture at the first MTP joint. I discussed the possibility of a postop shoe but [name] does not believe her pain is significant enough to warrant this. Instead we will have her buddy tape her first and second toes and wear a good supportive shoe. She will followup with me as previously scheduled.

## 2011-11-27 ENCOUNTER — Ambulatory Visit (INDEPENDENT_AMBULATORY_CARE_PROVIDER_SITE_OTHER): Payer: BC Managed Care – PPO | Admitting: Gynecology

## 2011-11-27 ENCOUNTER — Encounter: Payer: Self-pay | Admitting: Gynecology

## 2011-11-27 VITALS — Wt 212.0 lb

## 2011-11-27 DIAGNOSIS — L259 Unspecified contact dermatitis, unspecified cause: Secondary | ICD-10-CM

## 2011-11-27 DIAGNOSIS — N898 Other specified noninflammatory disorders of vagina: Secondary | ICD-10-CM

## 2011-11-27 LAB — WET PREP FOR TRICH, YEAST, CLUE
Clue Cells Wet Prep HPF POC: NONE SEEN
Trich, Wet Prep: NONE SEEN
WBC, Wet Prep HPF POC: NONE SEEN
Yeast Wet Prep HPF POC: NONE SEEN

## 2011-11-27 MED ORDER — NYSTATIN-TRIAMCINOLONE 100000-0.1 UNIT/GM-% EX CREA: TOPICAL_CREAM | Freq: Three times a day (TID) | CUTANEOUS | Status: DC

## 2011-11-27 NOTE — Progress Notes (Signed)
Patient presented to the office today with 2 issues. The first tissue being that she thought that her vaginal secretion was different than before. She described it is kind of like mucoid but no odor she denies any pruritus. Patient denies any sexual activity. She states her menstrual cycles are regular.  Also she has had issues with a contact dermatitis on her right wrist which is erythematous and at times it causes her described because of the itching.  Exam: Pelvic: Bartholin urethra Skene was within normal limits Vagina: No lesions or discharge Cervix: Normal clear mucoid to be expected with her time of cycle Uterus: Not examined Adnexa: Not examined Rectal: Not examined  Right wrist evidence of contact dermatitis slightly erythematous and slightly raised with some small scaly areas.  Assessment/plan: Patient reassured that her secretions were normal the wet prep was essentially negative. She will be given a prescription mytrex cream to apply 2-3 times a day to her right wrist for contact dermatitis.

## 2011-11-28 LAB — URINALYSIS W MICROSCOPIC + REFLEX CULTURE
Bacteria, UA: NONE SEEN
Bilirubin Urine: NEGATIVE
Casts: NONE SEEN
Crystals: NONE SEEN
Glucose, UA: NEGATIVE mg/dL
Hgb urine dipstick: NEGATIVE
Leukocytes, UA: NEGATIVE
Nitrite: NEGATIVE
Protein, ur: NEGATIVE mg/dL
Specific Gravity, Urine: 1.023 (ref 1.005–1.030)
Urobilinogen, UA: 0.2 mg/dL (ref 0.0–1.0)
pH: 6 (ref 5.0–8.0)

## 2011-11-30 ENCOUNTER — Ambulatory Visit (INDEPENDENT_AMBULATORY_CARE_PROVIDER_SITE_OTHER): Payer: BC Managed Care – PPO | Admitting: Sports Medicine

## 2011-11-30 ENCOUNTER — Encounter: Payer: Self-pay | Admitting: Sports Medicine

## 2011-11-30 VITALS — BP 127/89 | HR 62 | Ht 70.0 in | Wt 212.0 lb

## 2011-11-30 DIAGNOSIS — M25559 Pain in unspecified hip: Secondary | ICD-10-CM

## 2011-11-30 DIAGNOSIS — M76899 Other specified enthesopathies of unspecified lower limb, excluding foot: Secondary | ICD-10-CM

## 2011-11-30 DIAGNOSIS — M7060 Trochanteric bursitis, unspecified hip: Secondary | ICD-10-CM

## 2011-11-30 MED ORDER — GABAPENTIN 300 MG PO CAPS: ORAL_CAPSULE | ORAL | Status: DC

## 2011-11-30 NOTE — Progress Notes (Signed)
  Subjective:    Patient ID: [name], female    DOB: 1970-02-04, 41 y.o.   MRN: [id number]  HPI chief complaint: Right hip pain  [name] comes in today complaining of 2 weeks of right hip pain. She has a history of greater trochanteric bursitis in the left hip which responded well to cortisone injection back in June. Right hip pain is similar in nature to what she experienced at that time. Pain is all along the lateral aspect of her hip with some radiating pain down the lateral leg to the knee. No posterior hip pain. No groin pain. No associated numbness or tingling. Her pain is most noticeable at night with sleeping on this hip. She is currently taking 600 mg of a long acting gabapentin twice daily and is asking about increasing this dose. She also tells me that she has recently withdrawn from [facility] but is planning on returning in the spring. She tells me that her right toe is feeling much better  Interim medical history is unchanged from previous exam    Review of Systems     Objective:   Physical Exam Well-developed, well-nourished. No acute distress  Right hip: Smooth painless hip range of motion with a negative log roll. She has tenderness to palpation over the greater trochanteric bursa. Negative straight leg raise. Neurovascular intact distally. Walking without significant limp.       Assessment & Plan:  1. Right hip pain secondary to greater trochanteric bursitis 2. Improved right toe pain secondary to MTP synovitis with underlying osteoarthritis  Patient's right greater trochanteric bursa is injected today. Consent was obtained. Area was draped and prepped in a sterile fashion. 2 cc of Depo-Medrol and 6 cc of 0.5% Marcaine were injected into the greater trochanteric bursa. Patient tolerated the procedure without difficulty. Injection was performed with a spinal needle. She is shown a series of IT band stretches. I will change her Neurontin from the long acting form back to the  basic type and I'll have her take 600 mg q. A.m., 300 mg midday, and 600 mg each bedtime. She will let me know she cannot tolerate this and I will reevaluate her in 4 weeks.

## 2011-12-04 ENCOUNTER — Telehealth: Payer: Self-pay | Admitting: *Deleted

## 2011-12-04 ENCOUNTER — Encounter (INDEPENDENT_AMBULATORY_CARE_PROVIDER_SITE_OTHER): Payer: Self-pay | Admitting: Surgery

## 2011-12-04 NOTE — Telephone Encounter (Signed)
Left pt a VM to return my call  

## 2011-12-04 NOTE — Telephone Encounter (Signed)
Spoke with pt- she states she was much too drowsy on gabapentin 600 mg qam, 300 mg midday, and 600 mg qpm.  States Saturday she fell 2/2 dizziness from taking the gabapentin.  She has not taken gabapentin since. Would like to switch back to gralise (extended release gabapentin) Per Dr. Margaretha Sheffield this is fine.  Pt states she still has a refill at her pharmacy.

## 2011-12-04 NOTE — Telephone Encounter (Signed)
Message copied by Mora Bellman on Mon Dec 04, 2011  4:42 PM ------      Message from: CERESI, Shawna Orleans L      Created: Mon Dec 04, 2011  3:00 PM      Regarding: phone message       [name] called stating that she took neurontin last Thursday,friday and it made her so dizzy that she fell.  Wanted you to know She is discontinuing it.

## 2011-12-18 ENCOUNTER — Encounter (INDEPENDENT_AMBULATORY_CARE_PROVIDER_SITE_OTHER): Payer: Self-pay | Admitting: Surgery

## 2011-12-18 ENCOUNTER — Ambulatory Visit (INDEPENDENT_AMBULATORY_CARE_PROVIDER_SITE_OTHER): Payer: BC Managed Care – PPO | Admitting: Surgery

## 2011-12-18 VITALS — BP 110/70 | HR 72 | Temp 97.2°F | Resp 16 | Ht 70.0 in | Wt 215.8 lb

## 2011-12-18 DIAGNOSIS — Z711 Person with feared health complaint in whom no diagnosis is made: Secondary | ICD-10-CM

## 2011-12-18 NOTE — Progress Notes (Signed)
Subjective:     Patient ID: [name], female   DOB: [birth year], 41 y.o.   MRN: [id number]  HPI She was concerned about some tenderness where previous small lipomas removed from her anterior thigh over one year ago. She has a lot of pain in both her hips and has other orthopedic issues.  Review of Systems     Objective:   Physical Exam On exam, his incision is well-healed. There are no palpable masses and no erythema    Assessment:     No evidence of recurrent lipoma or problems    Plan:     Recommend continued care by orthopedist and other physicians given her other multiple medical problems. I reassured her that there was no current recurrent lipoma. I will see her back as needed

## 2011-12-21 ENCOUNTER — Encounter: Payer: Self-pay | Admitting: Sports Medicine

## 2011-12-21 ENCOUNTER — Ambulatory Visit (INDEPENDENT_AMBULATORY_CARE_PROVIDER_SITE_OTHER): Payer: BC Managed Care – PPO | Admitting: Sports Medicine

## 2011-12-21 VITALS — BP 117/80 | HR 67 | Ht 70.0 in | Wt 215.0 lb

## 2011-12-21 DIAGNOSIS — M76899 Other specified enthesopathies of unspecified lower limb, excluding foot: Secondary | ICD-10-CM

## 2011-12-21 DIAGNOSIS — M7060 Trochanteric bursitis, unspecified hip: Secondary | ICD-10-CM

## 2011-12-22 NOTE — Progress Notes (Signed)
  Subjective:    Patient ID: [name], female    DOB: [birth year], 41 y.o.   MRN: [id number]  HPI Patient comes in today for followup on left hip pain. Recent cortisone injection into the greater trochanteric bursa did provide her with some symptom relief but she still has intermittent pain. She's experiencing some discomfort on the right as well. She's doing her home exercises. Pain is tolerable at this time. She's doing well with her orthotics but she states that they tend to push her heel and of her shoe. Did not bring her orthotics with her today. She did not tolerate the change from Gralise to a shorter acting Neurontin so she has resumed using her Gralise. She is in a good mood today because she recently found out that her academic a peel at [employer] was approved for this past semester. She is planning on returning to classes in January.    Review of Systems     Objective:   Physical Exam Well-developed, well-nourished. No acute distress.  Each of her hips shows mild tenderness to palpation over the greater trochanteric bursa bilaterally. Negative log roll. Negative straight leg raise. Walking without a limp.       Assessment & Plan:  1. Improved left hip pain secondary greater trochanteric bursitis  Patient's pain has improved but not resolved. We discussed the possibility of physical therapy but we will hold on that for now. I will see her back in the office in 4 weeks however I have encouraged her to bring in her orthotics so that we may adjust them to make him fit better in her shoes.

## 2012-01-18 ENCOUNTER — Ambulatory Visit: Payer: Self-pay | Admitting: Sports Medicine

## 2012-02-19 ENCOUNTER — Ambulatory Visit (INDEPENDENT_AMBULATORY_CARE_PROVIDER_SITE_OTHER): Payer: BC Managed Care – PPO | Admitting: Sports Medicine

## 2012-02-19 ENCOUNTER — Encounter: Payer: Self-pay | Admitting: Sports Medicine

## 2012-02-19 VITALS — BP 120/83 | HR 80 | Ht 70.0 in | Wt 208.0 lb

## 2012-02-19 DIAGNOSIS — M76899 Other specified enthesopathies of unspecified lower limb, excluding foot: Secondary | ICD-10-CM

## 2012-02-19 DIAGNOSIS — M7060 Trochanteric bursitis, unspecified hip: Secondary | ICD-10-CM

## 2012-02-19 DIAGNOSIS — M25559 Pain in unspecified hip: Secondary | ICD-10-CM

## 2012-02-20 NOTE — Progress Notes (Signed)
  Subjective:    Patient ID: [name], female    DOB: Aug 17, 1970, 42 y.o.   MRN: [id number]  HPI chief complaint: Left hip pain  Patient comes in today complaining of returning left hip pain. She has a history of greater trochanteric bursitis in both hips. The left hip was injected back in June with good results. Her pain has begun to return. Mainly along the lateral aspect of the hip with some radiating pain down the lateral thigh to the knee. No groin pain. She has been doing her home exercises including stretching and hip abductor strengthening. She denies any groin pain.    Review of Systems     Objective:   Physical Exam Well-developed, well-nourished. No acute distress. Awake alert and oriented x3  Left hip: Smooth painless hip range of motion. Negative log roll. There is discrete tenderness to palpation over the greater trochanteric bursa. Negative straight leg raise. Neurovascularly intact distally. Walking without a significant limp.       Assessment & Plan:  1. Returning left hip pain secondary to greater trochanteric bursitis  Patient's left greater trochanteric bursa was injected today with cortisone. Patient is instructed to continue with her stretches and hip abductor strengthening. Followup for ongoing or recalcitrant issues.  Consent obtained and verified. Time-out conducted. Noted no overlying erythema, induration, or other signs of local infection. Skin prepped in a sterile fashion. Topical analgesic spray: Ethyl chloride. Joint:  Left greater troch bursa Needle: spinal needle Completed without difficulty. Meds: 6cc 0.5% marcaine, 2cc depomedrol (40mg /cc)  Advised to call if fevers/chills, erythema, induration, drainage, or persistent bleeding.

## 2012-03-20 ENCOUNTER — Ambulatory Visit (HOSPITAL_COMMUNITY)
Admission: RE | Admit: 2012-03-20 | Discharge: 2012-03-20 | Disposition: A | Discharge status: Home / Self Care | Payer: BC Managed Care – PPO | Source: Ambulatory Visit | Attending: Sports Medicine | Admitting: Sports Medicine

## 2012-03-20 ENCOUNTER — Ambulatory Visit (INDEPENDENT_AMBULATORY_CARE_PROVIDER_SITE_OTHER): Payer: BC Managed Care – PPO | Admitting: Sports Medicine

## 2012-03-20 VITALS — BP 126/86 | Ht 70.0 in | Wt 210.0 lb

## 2012-03-20 DIAGNOSIS — M79609 Pain in unspecified limb: Secondary | ICD-10-CM | POA: Insufficient documentation

## 2012-03-20 DIAGNOSIS — M79674 Pain in right toe(s): Secondary | ICD-10-CM

## 2012-03-20 NOTE — Progress Notes (Signed)
  Subjective:    Patient ID: [name], female    DOB: 1970-05-10, 42 y.o.   MRN: [id number]  HPI Patient comes in today complaining of right great toe pain. She initially injured the toe back in October when she suffered an axial loading injury to the toe after accidentally kicking a stump. She's had persistent pain at the MTP joint particularly with hyperextension. Intermittent swelling as well. She has a history of first MTP osteoarthritis and has had cortisone injections in the past. She denies any recent injury.    Review of Systems     Objective:   Physical Exam Well-developed, well-nourished. No acute distress  Right foot with attention to the right great toe: Limited passive flexion and extension. Mild joint effusion. No erythema. There is tenderness to palpation across the dorsum of the joint. No clinical angulation or malrotation. Brisk capillary refill. She's walking with a slight limp.       Assessment & Plan:  1. Right first MTP pain-rule out fracture versus osteoarthritis  Patient has a known history of osteoarthritis in this joint. However, given her history of trauma in October I would like to order a plain x-ray to rule out fracture. She will followup with me tomorrow to go over this x-ray. I discussed the possibility of a repeat cortisone if the x-ray shows no fracture but the patient may be more interested in seeking the opinion of a foot and ankle orthopedic specialist. We will discuss this further tomorrow after I reviewed her x-rays.

## 2012-03-21 ENCOUNTER — Telehealth: Payer: Self-pay | Admitting: Sports Medicine

## 2012-03-21 ENCOUNTER — Ambulatory Visit: Payer: BC Managed Care – PPO | Admitting: Sports Medicine

## 2012-03-21 NOTE — Telephone Encounter (Signed)
I spoke with [name] today on the phone regarding x-rays of her toe. There does not appear to be a significant amount of degenerative change at the first MTP joint. She has had 2 previous cortisone injections into the joint without much symptom relief. At this point I recommended consultation with a podiatrist to see if they have anything further to offer her. I've asked her to followup with me after that consultation.

## 2012-03-22 ENCOUNTER — Telehealth: Payer: Self-pay | Admitting: *Deleted

## 2012-03-22 NOTE — Telephone Encounter (Signed)
Message copied by Jacki Cones C on Fri Mar 22, 2012 10:03 AM ------      Message from: Reino Bellis R      Created: Thu Mar 21, 2012 12:12 PM      Regarding: referal       Please refer to Dr Charlsie Merles or one of his partners for 1st MTP joint pain (podiatry)                  ----- Message -----         From: Rad Results In Interface         Sent: 03/20/2012   1:28 PM           To: Ralene Cork, DO                   ------

## 2012-03-22 NOTE — Telephone Encounter (Signed)
Referred pt to Dr. Celene Skeen as she has seen him before. 03/29/12 @ 11 am.  Pt notified of appt info.

## 2012-03-29 ENCOUNTER — Telehealth: Payer: Self-pay | Admitting: *Deleted

## 2012-03-29 NOTE — Telephone Encounter (Signed)
Pt called to let Dr. Margaretha Sheffield know that she saw Dr. Celene Skeen this morning and he put her in a fiber carbon insert to keep her toe from flexing for 60-90 days.  If this does not work she may need surgery to shorten her 1st MT.

## 2012-05-08 ENCOUNTER — Ambulatory Visit (INDEPENDENT_AMBULATORY_CARE_PROVIDER_SITE_OTHER): Payer: BC Managed Care – PPO | Admitting: Neurology

## 2012-05-08 ENCOUNTER — Encounter: Payer: Self-pay | Admitting: Neurology

## 2012-05-08 DIAGNOSIS — M25511 Pain in right shoulder: Secondary | ICD-10-CM

## 2012-05-08 DIAGNOSIS — M6740 Ganglion, unspecified site: Secondary | ICD-10-CM

## 2012-05-08 DIAGNOSIS — E229 Hyperfunction of pituitary gland, unspecified: Secondary | ICD-10-CM

## 2012-05-08 DIAGNOSIS — M7660 Achilles tendinitis, unspecified leg: Secondary | ICD-10-CM | POA: Insufficient documentation

## 2012-05-08 DIAGNOSIS — F909 Attention-deficit hyperactivity disorder, unspecified type: Secondary | ICD-10-CM

## 2012-05-08 DIAGNOSIS — T7840XA Allergy, unspecified, initial encounter: Secondary | ICD-10-CM | POA: Insufficient documentation

## 2012-05-08 DIAGNOSIS — M129 Arthropathy, unspecified: Secondary | ICD-10-CM

## 2012-05-08 DIAGNOSIS — IMO0002 Reserved for concepts with insufficient information to code with codable children: Secondary | ICD-10-CM

## 2012-05-08 DIAGNOSIS — I10 Essential (primary) hypertension: Secondary | ICD-10-CM | POA: Insufficient documentation

## 2012-05-08 DIAGNOSIS — M25519 Pain in unspecified shoulder: Secondary | ICD-10-CM

## 2012-05-08 DIAGNOSIS — E221 Hyperprolactinemia: Secondary | ICD-10-CM | POA: Insufficient documentation

## 2012-05-08 DIAGNOSIS — M1990 Unspecified osteoarthritis, unspecified site: Secondary | ICD-10-CM

## 2012-05-08 DIAGNOSIS — G43709 Chronic migraine without aura, not intractable, without status migrainosus: Secondary | ICD-10-CM

## 2012-05-08 MED ORDER — ONABOTULINUMTOXINA 100 UNITS IJ SOLR
150.0000 [IU] | Freq: Once | INTRAMUSCULAR | Status: AC
  Administered 2012-05-08: 150 [IU] via INTRAMUSCULAR
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
## 2012-05-15 ENCOUNTER — Ambulatory Visit (INDEPENDENT_AMBULATORY_CARE_PROVIDER_SITE_OTHER): Payer: BC Managed Care – PPO | Admitting: Family Medicine

## 2012-05-15 ENCOUNTER — Encounter: Payer: Self-pay | Admitting: Family Medicine

## 2012-05-15 VITALS — BP 128/86 | HR 71 | Ht 70.0 in | Wt 217.0 lb

## 2012-05-15 DIAGNOSIS — M62830 Muscle spasm of back: Secondary | ICD-10-CM

## 2012-05-15 DIAGNOSIS — M5380 Other specified dorsopathies, site unspecified: Secondary | ICD-10-CM

## 2012-05-15 DIAGNOSIS — M25519 Pain in unspecified shoulder: Secondary | ICD-10-CM

## 2012-05-15 DIAGNOSIS — M25511 Pain in right shoulder: Secondary | ICD-10-CM

## 2012-05-15 MED ORDER — CYCLOBENZAPRINE HCL 5 MG PO TABS: 5.0000 mg | ORAL_TABLET | Freq: Three times a day (TID) | ORAL | Status: DC | PRN

## 2012-05-15 NOTE — Patient Instructions (Signed)
You have a severe muscle spasm in your rhomboids, trapezius muscle on the right side. Start the home stretches and exercises I showed you - hold stretches 5 seconds each direction, repeat 10 times (lawnmower, cat/camel, robbery). Flexeril as needed for spasms - no driving if this makes you sleepy. Advil 600mg  (3 tablets) three times a day with food for pain and inflammation. Expect this to improve over next 1-2 weeks. Massage may be helpful as well. Follow up as needed.

## 2012-05-16 ENCOUNTER — Encounter: Payer: Self-pay | Admitting: Family Medicine

## 2012-05-16 NOTE — Progress Notes (Signed)
Subjective:    Patient ID: [name], female    DOB: Sep 03, 1970, 42 y.o.   MRN: [id number]  PCP: Dr Orlene Erm  HPI 42 yo F here for right shoulder pain.  Patient denies known injury. She states she had no problems until this morning when she woke up with right posterior shoulder pain. Radiates into neck. Worse with all shoulder motions. No numbness/tingling. Pain worse with a deep breath but no cough, other URI symptoms. Taking advil as needed.  Past Medical History  Diagnosis Date  . Bipolar disorder   . Eczema   . Pes planus   . Achilles tendinitis   . Hypertension   . Migraine   . Achilles tendinitis   . Ganglion cyst 09/29/2009    left wrist (2 cyst)  . Arthritis   . Allergy   . Lipoma   . ADHD (attention deficit hyperactivity disorder)   . Hyperprolactinemia   . Bipolar affective     Current Outpatient Prescriptions on File Prior to Visit  Medication Sig Dispense Refill  . amphetamine-dextroamphetamine (ADDERALL) 10 MG tablet Take 1 tablet (10 mg total) by mouth 2 (two) times daily at 8am and 2pm. For attention issues.  60 tablet  0  . divalproex (DEPAKOTE ER) 500 MG 24 hr tablet Take 2 tablets (1,000 mg total) by mouth at bedtime. For mood stabilization.  60 tablet  0  . Gabapentin, PHN, (GRALISE) 600 MG TABS Take 600 mg by mouth at bedtime.      . hydrochlorothiazide (HYDRODIURIL) 25 MG tablet Take 1 tablet (25 mg total) by mouth daily. For blood pressure control.  30 tablet  0  . hydrOXYzine (ATARAX/VISTARIL) 50 MG tablet Take 1 tablet (50 mg total) by mouth 2 (two) times daily as needed for anxiety.  60 tablet  0  . lamoTRIgine (LAMICTAL) 150 MG tablet Take 200 mg by mouth at bedtime. For mood stabilization.      Marland Kitchen lurasidone (LATUDA) 80 MG TABS Take 1 tablet (80 mg total) by mouth daily at 6 PM. For psychosis and mood stabilization.  30 tablet  0  . metoprolol succinate (TOPROL-XL) 100 MG 24 hr tablet Take 1 tablet (100 mg total) by mouth daily. Take with or  immediately following a meal for blood pressure control.  30 tablet  0  . Multiple Vitamin (MULTIVITAMIN WITH MINERALS) TABS Take 1 tablet by mouth daily. For nutritional supplementation.  30 tablet  0  . mupirocin ointment (BACTROBAN) 2 %       . nystatin-triamcinolone (MYCOLOG II) cream as needed.      . SUMAtriptan (IMITREX) 6 MG/0.5ML SOLN injection as needed.      . zolpidem (AMBIEN) 10 MG tablet Take 1 tablet (10 mg total) by mouth at bedtime. For sleep.  30 tablet  0   No current facility-administered medications on file prior to visit.    Past Surgical History  Procedure Laterality Date  . Tumor resection left thigh    . Ankle surgery  12/88    left   . Lipoma removal    . Ganglion cyst excision  2011  . Chest nodule  1990?    rt chest wall nodule removal  . Shoulder surgery  01/13/2011    right, partial tear  . Right bunioectomy      Allergies  Allergen Reactions  . Adhesive (Tape) Itching and Rash    Also reacted to Steri Strips and Band-Aids.  . Dilaudid (Hydromorphone Hcl) Itching  . Morphine Nausea  And Vomiting  . Penicillins Hives  . Percocet (Oxycodone-Acetaminophen) Itching  . Prednisone Hives  . Provera (Medroxyprogesterone Acetate) Other (See Comments)    Causes manic episodes  . Ultram (Tramadol Hcl) Itching    History   Social History  . Marital Status: Single    Spouse Name: N/A    Number of Children: N/A  . Years of Education: N/A   Occupational History  . Not on file.   Social History Main Topics  . Smoking status: Never Smoker   . Smokeless tobacco: Never Used  . Alcohol Use: No  . Drug Use: No  . Sexually Active: Yes    Birth Control/ Protection: Pill   Other Topics Concern  . Not on file   Social History Narrative  . No narrative on file    Family History  Problem Relation Age of Onset  . Hypertension Mother   . Hyperlipidemia Mother   . Heart attack Father   . Heart disease Father   . Hypertension Father   . Diabetes  Paternal Grandfather   . Heart disease Maternal Aunt   . Breast cancer Maternal Aunt   . Heart disease Maternal Grandmother   . Cancer Maternal Grandmother     colon    BP 128/86  Pulse 71  Ht 5\' 10"  (1.778 m)  Wt 217 lb (98.431 kg)  BMI 31.14 kg/m2  Review of Systems See HPI above.    Objective:   Physical Exam Gen: NAD  R shoulder: No swelling, ecchymoses.  No gross deformity.  Spasm medial to right scapula within rhomboids, trapezius TTP in spasm noted above.  No other shoulder tenderness. FROM. Negative Hawkins, Neers. Negative Speeds, Yergasons. Strength 5/5 with empty can and resisted internal/external rotation. Negative apprehension. NV intact distally.     Assessment & Plan:  1. Right shoulder pain - 2/2 trapezius and rhomboid spasms.  Reassured patient.  Start with advil, flexeril.  Shown home exercise program and stretches.  Consider massage.  Should resolve over next 1-2 weeks.  Consider PT if still struggling.

## 2012-05-16 NOTE — Assessment & Plan Note (Signed)
2/2 trapezius and rhomboid spasms.  Reassured patient.  Start with advil, flexeril.  Shown home exercise program and stretches.  Consider massage.  Should resolve over next 1-2 weeks.  Consider PT if still struggling.

## 2012-07-09 ENCOUNTER — Encounter: Payer: Self-pay | Admitting: Neurology

## 2012-07-09 ENCOUNTER — Ambulatory Visit (INDEPENDENT_AMBULATORY_CARE_PROVIDER_SITE_OTHER): Payer: BC Managed Care – PPO | Admitting: Neurology

## 2012-07-09 VITALS — BP 111/75 | HR 65 | Wt 222.0 lb

## 2012-07-09 DIAGNOSIS — G43709 Chronic migraine without aura, not intractable, without status migrainosus: Secondary | ICD-10-CM

## 2012-07-09 DIAGNOSIS — IMO0002 Reserved for concepts with insufficient information to code with codable children: Secondary | ICD-10-CM
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
## 2012-07-09 NOTE — Patient Instructions (Signed)
Insomnia Insomnia means you have trouble falling or staying asleep. It affects about one person in three at different times and is usually related to stress from work, school, or personal relations. Insomnia is also a sign of depression or anxiety. Other medical problems that cause insomnia include conditions that cause pain, night leg cramps, coughing, shortness of breath, urinary problems, and fevers. Sleep apnea is an abnormal breathing pattern at night that can cause insomnia and loud snoring. Certain medications and excess intake of caffeine drinks (coffee, tea, colas) can also interfere with normal sleep. Treatment for insomnia depends on the cause. Besides specific medical treatment, the following measures can help you relax and get better sleep. Get regular exercise every day, at least several hours before bed time. Try to get to bed at the same time every night. Take a hot bath before retiring to help you relax. Do not stay in bed if you are unable to sleep. During the daytime avoid staying in bed to watch television, eat, or read. Reduce unwanted noise and light in your room. Keep your room at a comfortable temperature. Avoid alcohol as it causes one to sleep less soundly, may cause you to awaken during the night, and can leave you feeling groggy the next day. Using a mild sedative prescribed or suggested by your caregiver may be needed, but the daily use of sleeping pills is not recommended. Anti-depressant medicines can improve sleep in people with depression. Please call your doctor for follow up care to better understand the cause and proper treatment of your insomnia. Document Released: 02/24/2004 Document Revised: 04/10/2011 Document Reviewed: 01/16/2005 [employer] Patient Information 2014 [employer].  

## 2012-07-10 ENCOUNTER — Encounter: Payer: Self-pay | Admitting: Sports Medicine

## 2012-07-10 ENCOUNTER — Ambulatory Visit (INDEPENDENT_AMBULATORY_CARE_PROVIDER_SITE_OTHER): Payer: BC Managed Care – PPO | Admitting: Sports Medicine

## 2012-07-10 VITALS — BP 125/84 | HR 75 | Ht 70.0 in | Wt 222.0 lb

## 2012-07-10 DIAGNOSIS — M7062 Trochanteric bursitis, left hip: Secondary | ICD-10-CM

## 2012-07-10 DIAGNOSIS — M25559 Pain in unspecified hip: Secondary | ICD-10-CM

## 2012-07-10 DIAGNOSIS — M76899 Other specified enthesopathies of unspecified lower limb, excluding foot: Secondary | ICD-10-CM

## 2012-07-10 DIAGNOSIS — M25552 Pain in left hip: Secondary | ICD-10-CM

## 2012-07-10 MED ORDER — METHYLPREDNISOLONE ACETATE 40 MG/ML IJ SUSP
40.0000 mg | Freq: Once | INTRAMUSCULAR | Status: AC
  Administered 2012-07-10: 40 mg via INTRA_ARTICULAR

## 2012-07-10 NOTE — Progress Notes (Signed)
  Subjective:    Patient ID: [name], female    DOB: 24-Oct-1970, 42 y.o.   MRN: [id number]  HPI chief complaint: Left hip pain  Patient comes in today complaining of returning left hip pain. She has a history of greater trochanteric bursitis in the same hip which was treated with a cortisone injection back in January. She was doing well until she fell directly on this left hip a week and a half ago. She now has returning lateral hip pain. Denies pain in the groin. No low back pain. Pain is identical in nature to what she's experienced previously with her bursitis. She has been able to bear weight. No associated numbness or tingling.    Review of Systems     Objective:   Physical Exam  Well-developed, well-nourished. No acute distress  Hip: Smooth painless hip range of motion with a negative log roll. No soft tissue swelling. No ecchymosis. There is tenderness to palpation over the left greater trochanteric bursa. Negative straight leg raise. Good strength. Neurovascularly intact distally. Walking without significant limp.      Assessment & Plan:  1. Returning left hip pain secondary to posttraumatic greater trochanteric bursitis  I've agreed to reinject the left greater trochanteric bursa today. This was done after risks and benefits were explained. If symptoms persist despite today's injection then I would start with getting a plain x-ray of her left hip. She will followup when necessary.  Consent obtained and verified. Time-out conducted. Noted no overlying erythema, induration, or other signs of local infection. Skin prepped in a sterile fashion. Topical analgesic spray: Ethyl chloride. Joint: Left hip greater troch bursa Needle: 18g spinal needle Completed without difficulty. Meds: 6cc 1% xylocaine, 2cc (80mg ) depomedrol  Advised to call if fevers/chills, erythema, induration, drainage, or persistent bleeding.

## 2012-07-15 ENCOUNTER — Ambulatory Visit: Payer: Self-pay | Admitting: Sports Medicine

## 2012-08-07 ENCOUNTER — Ambulatory Visit (INDEPENDENT_AMBULATORY_CARE_PROVIDER_SITE_OTHER): Payer: BC Managed Care – PPO | Admitting: Neurology

## 2012-08-07 ENCOUNTER — Encounter: Payer: Self-pay | Admitting: Neurology

## 2012-08-07 VITALS — BP 112/68 | HR 65 | Wt 217.0 lb

## 2012-08-07 DIAGNOSIS — G43909 Migraine, unspecified, not intractable, without status migrainosus: Secondary | ICD-10-CM

## 2012-08-07 DIAGNOSIS — G43719 Chronic migraine without aura, intractable, without status migrainosus: Secondary | ICD-10-CM

## 2012-08-07 MED ORDER — ONABOTULINUMTOXINA 100 UNITS IJ SOLR
150.0000 [IU] | Freq: Once | INTRAMUSCULAR | Status: AC
  Administered 2012-08-07: 150 [IU] via INTRAMUSCULAR
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
## 2012-08-08 ENCOUNTER — Encounter: Payer: Self-pay | Admitting: Sports Medicine

## 2012-08-08 ENCOUNTER — Ambulatory Visit
Admission: RE | Admit: 2012-08-08 | Discharge: 2012-08-08 | Disposition: A | Discharge status: Home / Self Care | Payer: BC Managed Care – PPO | Source: Ambulatory Visit | Attending: Sports Medicine | Admitting: Sports Medicine

## 2012-08-08 ENCOUNTER — Telehealth: Payer: Self-pay | Admitting: *Deleted

## 2012-08-08 ENCOUNTER — Ambulatory Visit (INDEPENDENT_AMBULATORY_CARE_PROVIDER_SITE_OTHER): Payer: BC Managed Care – PPO | Admitting: Sports Medicine

## 2012-08-08 VITALS — BP 122/89 | HR 71 | Ht 70.0 in | Wt 217.0 lb

## 2012-08-08 DIAGNOSIS — M25552 Pain in left hip: Secondary | ICD-10-CM

## 2012-08-08 DIAGNOSIS — M25559 Pain in unspecified hip: Secondary | ICD-10-CM

## 2012-08-08 DIAGNOSIS — S76012A Strain of muscle, fascia and tendon of left hip, initial encounter: Secondary | ICD-10-CM

## 2012-08-08 DIAGNOSIS — IMO0002 Reserved for concepts with insufficient information to code with codable children: Secondary | ICD-10-CM

## 2012-08-08 NOTE — Telephone Encounter (Signed)
Left pt a VM that x-rays were normal

## 2012-08-08 NOTE — Progress Notes (Addendum)
  Subjective:    Patient ID: [name], female    DOB: 10-15-1970, 42 y.o.   MRN: [id number]  HPI chief complaint: Left hip pain  [name] comes in today complaining of anterior left hip pain. She was last seen in the office one month ago when she was complaining of lateral hip pain after her fall. A greater trochanteric bursa injection was administered and her lateral hip pain resolved. She can't quite recall whether or not her anterior hip pain started at that time. It is most bothersome when going from a seated to a standing position but it is also uncomfortable with prolonged standing or activity. No low back pain. No associated numbness or tingling. She is getting an occasional catch in the anterior hip as well.    Review of Systems     Objective:   Physical Exam Well-developed, well-nourished. No acute distress. Awake alert and oriented x3  Left hip: Smooth painless hip range of motion with a negative logroll. She is tender to palpation along the rectus femoris tendon. Mild pain with resisted hip flexion. No palpable defect. No tenderness over the greater trochanteric bursa. Negative straight raise. Neurovascularly intact distally. Walking without a limp.       Assessment & Plan:  1. Anterior hip pain secondary to hip flexor strain 2. Resolved greater trochanteric bursitis  Given her recent fall and going to check an AP pelvis and lateral left hip plain film. If unremarkable I will have [name] start some physical therapy with Ellamae Sia. 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 where Jonny Ruiz also has an office. I think she would benefit from some iontophoresis she can wean to a home exercise program per the therapist's discretion. Followup with me if symptoms persist or worsen.  Addendum: X-rays are negative for significant degenerative changes or acute abnormality

## 2012-08-08 NOTE — Telephone Encounter (Signed)
Message copied by Jacki Cones C on Thu Aug 08, 2012  3:29 PM ------      Message from: Reino Bellis R      Created: Thu Aug 08, 2012  1:56 PM      Regarding: xrays       Please call [name] and tell her that her xrays look fine            ----- Message -----         From: Rad Results In Interface         Sent: 08/08/2012   1:07 PM           To: Ralene Cork, DO                   ------

## 2012-08-08 NOTE — Telephone Encounter (Signed)
Message copied by Jacki Cones C on Thu Aug 08, 2012  3:28 PM ------      Message from: Reino Bellis R      Created: Thu Aug 08, 2012  1:56 PM      Regarding: xrays       Please call [name] and tell her that her xrays look fine            ----- Message -----         From: Rad Results In Interface         Sent: 08/08/2012   1:07 PM           To: Ralene Cork, DO                   ------

## 2012-09-24 ENCOUNTER — Telehealth: Payer: Self-pay | Admitting: Neurology

## 2012-09-27 ENCOUNTER — Ambulatory Visit (INDEPENDENT_AMBULATORY_CARE_PROVIDER_SITE_OTHER): Payer: BC Managed Care – PPO | Admitting: Neurology

## 2012-09-27 ENCOUNTER — Other Ambulatory Visit: Payer: Self-pay | Admitting: Neurology

## 2012-09-27 DIAGNOSIS — G43709 Chronic migraine without aura, not intractable, without status migrainosus: Secondary | ICD-10-CM

## 2012-09-27 MED ORDER — PROMETHAZINE HCL 25 MG PO TABS: 25.0000 mg | ORAL_TABLET | Freq: Four times a day (QID) | ORAL | Status: DC | PRN

## 2012-09-27 MED ORDER — VALPROATE SODIUM 500 MG/5ML IV SOLN
1000.0000 mg | INTRAVENOUS | Status: DC
  Administered 2012-09-27: 1000 mg via INTRAVENOUS

## 2012-09-27 NOTE — Telephone Encounter (Signed)
Per Dohmeier the patient needs to come in for 500 MG Depacon infusion. Patient will be out of school at 61 ad will come then. Sandy notified called patient she agreed.

## 2012-09-27 NOTE — Patient Instructions (Signed)
Patient to go home and rest. 

## 2012-09-27 NOTE — Telephone Encounter (Signed)
WID:  Patient here for Depacon infusion.  She asked for a prescription for Phenergan tabs since she was all out.  I spoke to Dr. Vickey Huger on her way out, she said it was fine but asked that work in doctor order med.

## 2012-09-27 NOTE — Progress Notes (Signed)
Patient here for Depacon infusion.  Patient to treatment room.  IV attempted in right hand unsuccessful, 2nd attempt in left inner AC successful, 22g angiocath.  Depacon 500mg /152ml NS started at 1220.  Patient very talkative during visit.  States her headache is about level 7.5.  No change after first 500mg  of Depacon, verbal order for another 500mg .  Another Depacon 500mg /18ml of NS started at 1235.  Upon completion headache level down to 3.  Patient requested that doctor call in some Phenergan for nausea.  Patient to checkout in NAD.

## 2012-09-27 NOTE — Telephone Encounter (Signed)
I have called in phenergan prn for her, please let her know

## 2012-09-27 NOTE — Telephone Encounter (Signed)
Patient has been having headaches everyday this week, mostly in the afternoon. Patient is currently taking Botox and OTC tylenol, she says she has the Imitrex injection but didn't have it with her at that time. Patient does not want botox anymore, patient has already tried Excedrin  migraine but the pharmacist told her it was not good for her. Please advise best contact is [phone]

## 2012-09-28 ENCOUNTER — Telehealth: Payer: Self-pay | Admitting: Neurology

## 2012-09-28 MED ORDER — PROMETHAZINE HCL 25 MG PO TABS: 25.0000 mg | ORAL_TABLET | Freq: Four times a day (QID) | ORAL | Status: DC | PRN

## 2012-09-28 NOTE — Telephone Encounter (Signed)
The patient called. She needed a RX for nausea. I called in a Rx for phenergan. 25 mg tab, #30 with no refills.

## 2012-10-01 NOTE — Telephone Encounter (Signed)
I called patient and let her know that Dr. Terrace Arabia has called in a prescription for phenergan for her. I reviewed the order with patient. She thanked me for the information.

## 2012-11-13 ENCOUNTER — Ambulatory Visit (INDEPENDENT_AMBULATORY_CARE_PROVIDER_SITE_OTHER): Payer: BC Managed Care – PPO | Admitting: Sports Medicine

## 2012-11-13 ENCOUNTER — Encounter: Payer: Self-pay | Admitting: Sports Medicine

## 2012-11-13 ENCOUNTER — Telehealth: Payer: Self-pay | Admitting: *Deleted

## 2012-11-13 VITALS — BP 126/94 | HR 66 | Ht 70.0 in | Wt 217.0 lb

## 2012-11-13 DIAGNOSIS — Z5189 Encounter for other specified aftercare: Secondary | ICD-10-CM

## 2012-11-13 DIAGNOSIS — S76012D Strain of muscle, fascia and tendon of left hip, subsequent encounter: Secondary | ICD-10-CM

## 2012-11-13 MED ORDER — DICLOFENAC SODIUM 1 % TD GEL: 4.0000 g | Freq: Four times a day (QID) | TRANSDERMAL | Status: DC | PRN

## 2012-11-13 NOTE — Telephone Encounter (Signed)
Pt requested extension of handicap placard, foot is better but on occasion becomes uncomfortable after walking long distance.  I ordered 60 days extension of handicap placard, [name] wrote.  Pt contacted to pick-up.

## 2012-11-13 NOTE — Progress Notes (Signed)
Pt is going to try aspercream instead of voltaren gel, as it is not covered by her insurance.

## 2012-11-13 NOTE — Progress Notes (Signed)
  Subjective:    Patient ID: [name], female    DOB: 16-Mar-1970, 42 y.o.   MRN: [id number]  HPI [name] comes in today with persistent anterior left hip pain. I last saw her back in July with a similar complaint. She was sent to physical therapy but she was only able to go to one visit. She was educated in a home exercise program but she admits that she has not been compliant with her exercises. She describes a pulling sensation along the anterior hip which is present with prolonged sitting and standing. Occasional catching in the hip as well. No pain along the lateral hip. No associated numbness or tingling. No recent trauma. X-rays of her left hip done in July were unremarkable in regards to significant degenerative changes or anything acute.    Review of Systems     Objective:   Physical Exam Sitting comfortably in the exam room. No acute distress. Awake alert and oriented x3  Left hip: Smooth painless hip range of motion with a negative log roll. There is tenderness to palpation along the rectus femoris tendon just inferior to the AIIS. No palpable defect. No tenderness over the greater trochanteric bursa. Reproducible pain with resisted hip flexion. Good strength distally. Neurovascularly intact distally. Walking without a significant limp.       Assessment & Plan:  Persistant left hip pain secondary to hip flexor strain  I considered topical nitroglycerin but the patient has a history of migraines. She would like to try a little more physical therapy at a [facility] facility which is more convenient for her. I've prescribed topical Voltaren to be applied 3 times daily and she will followup with me in 6 weeks.

## 2012-11-25 ENCOUNTER — Ambulatory Visit: Payer: Self-pay | Admitting: Physical Therapy

## 2012-11-25 ENCOUNTER — Ambulatory Visit: Payer: BC Managed Care – PPO | Attending: Sports Medicine | Admitting: Physical Therapy

## 2012-11-25 DIAGNOSIS — IMO0001 Reserved for inherently not codable concepts without codable children: Secondary | ICD-10-CM | POA: Insufficient documentation

## 2012-11-25 DIAGNOSIS — R269 Unspecified abnormalities of gait and mobility: Secondary | ICD-10-CM | POA: Insufficient documentation

## 2012-11-25 DIAGNOSIS — R42 Dizziness and giddiness: Secondary | ICD-10-CM | POA: Insufficient documentation

## 2012-11-28 ENCOUNTER — Ambulatory Visit: Payer: BC Managed Care – PPO | Admitting: Physical Therapy

## 2012-12-04 ENCOUNTER — Ambulatory Visit: Payer: BC Managed Care – PPO | Attending: Sports Medicine | Admitting: Physical Therapy

## 2012-12-04 DIAGNOSIS — IMO0001 Reserved for inherently not codable concepts without codable children: Secondary | ICD-10-CM | POA: Insufficient documentation

## 2012-12-04 DIAGNOSIS — R269 Unspecified abnormalities of gait and mobility: Secondary | ICD-10-CM | POA: Insufficient documentation

## 2012-12-04 DIAGNOSIS — R42 Dizziness and giddiness: Secondary | ICD-10-CM | POA: Insufficient documentation

## 2012-12-05 ENCOUNTER — Other Ambulatory Visit: Payer: Self-pay

## 2012-12-11 ENCOUNTER — Ambulatory Visit: Payer: BC Managed Care – PPO | Admitting: Physical Therapy

## 2012-12-23 ENCOUNTER — Ambulatory Visit: Payer: Self-pay | Admitting: Sports Medicine

## 2012-12-25 ENCOUNTER — Ambulatory Visit (INDEPENDENT_AMBULATORY_CARE_PROVIDER_SITE_OTHER): Payer: BC Managed Care – PPO | Admitting: Family Medicine

## 2012-12-25 ENCOUNTER — Encounter: Payer: Self-pay | Admitting: Family Medicine

## 2012-12-25 VITALS — BP 131/93 | HR 76 | Ht 70.0 in | Wt 222.0 lb

## 2012-12-25 DIAGNOSIS — S76111A Strain of right quadriceps muscle, fascia and tendon, initial encounter: Secondary | ICD-10-CM

## 2012-12-25 DIAGNOSIS — IMO0002 Reserved for concepts with insufficient information to code with codable children: Secondary | ICD-10-CM

## 2012-12-25 NOTE — Patient Instructions (Signed)
You have a quad strain. Compression sleeve when up and walking around for next 6 weeks. Heat 15 minutes at a time 3-4 times a day. Straight leg raises, standing hip rotations, knee extensions, and hip side raises 3 sets of 10 once a day for next 6 weeks. Add ankle weight if these become too easy. This should resolve over the next 4-6 weeks.

## 2012-12-30 ENCOUNTER — Encounter: Payer: Self-pay | Admitting: Podiatry

## 2012-12-30 ENCOUNTER — Encounter: Payer: Self-pay | Admitting: Family Medicine

## 2012-12-30 ENCOUNTER — Ambulatory Visit: Payer: Self-pay | Admitting: Podiatry

## 2012-12-30 ENCOUNTER — Ambulatory Visit (INDEPENDENT_AMBULATORY_CARE_PROVIDER_SITE_OTHER): Payer: BC Managed Care – PPO | Admitting: Podiatry

## 2012-12-30 ENCOUNTER — Ambulatory Visit (INDEPENDENT_AMBULATORY_CARE_PROVIDER_SITE_OTHER): Payer: BC Managed Care – PPO

## 2012-12-30 VITALS — BP 132/74 | HR 69 | Resp 18

## 2012-12-30 DIAGNOSIS — Z9889 Other specified postprocedural states: Secondary | ICD-10-CM

## 2012-12-30 DIAGNOSIS — M2021 Hallux rigidus, right foot: Secondary | ICD-10-CM

## 2012-12-30 DIAGNOSIS — M2020 Hallux rigidus, unspecified foot: Secondary | ICD-10-CM

## 2012-12-30 DIAGNOSIS — S76111A Strain of right quadriceps muscle, fascia and tendon, initial encounter: Secondary | ICD-10-CM | POA: Insufficient documentation

## 2012-12-30 DIAGNOSIS — M779 Enthesopathy, unspecified: Secondary | ICD-10-CM

## 2012-12-30 NOTE — Progress Notes (Signed)
Patient ID: [name], female   DOB: Jun 05, 1970, 42 y.o.   MRN: [id number]  PCP: Katherine Basset, MD  Subjective:   HPI: Patient is a 42 y.o. female here for right thigh injury.  Patient reports on 11/22 she was stepping backwards and tripped over a brick in her driveway. Felt a pull in right thigh just above the knee. Felt like knee was going to buckle when walking on it due to thigh pain. Has been doing home exercises, PT for left leg. Not taking any medicines, using a sleeve. Hurt the left side back in June. No bruising, swelling.  Past Medical History  Diagnosis Date  . Bipolar disorder   . Eczema   . Pes planus   . Achilles tendinitis   . Hypertension   . Migraine   . Achilles tendinitis   . Ganglion cyst 09/29/2009    left wrist (2 cyst)  . Arthritis   . Allergy   . Lipoma   . ADHD (attention deficit hyperactivity disorder)   . Hyperprolactinemia   . Bipolar affective     Current Outpatient Prescriptions on File Prior to Visit  Medication Sig Dispense Refill  . amphetamine-dextroamphetamine (ADDERALL) 10 MG tablet Take 1 tablet (10 mg total) by mouth 2 (two) times daily at 8am and 2pm. For attention issues.  60 tablet  0  . diclofenac sodium (VOLTAREN) 1 % GEL Apply 4 g topically 4 (four) times daily as needed.  1 Tube  2  . divalproex (DEPAKOTE ER) 500 MG 24 hr tablet Take 2 tablets (1,000 mg total) by mouth at bedtime. For mood stabilization.  60 tablet  0  . Gabapentin, PHN, (GRALISE) 600 MG TABS Take 600 mg by mouth at bedtime.      . hydrochlorothiazide (HYDRODIURIL) 25 MG tablet Take 1 tablet (25 mg total) by mouth daily. For blood pressure control.  30 tablet  0  . hydrOXYzine (ATARAX/VISTARIL) 50 MG tablet Take 1 tablet (50 mg total) by mouth 2 (two) times daily as needed for anxiety.  60 tablet  0  . hydrOXYzine (VISTARIL) 25 MG capsule Take 25 mg by mouth daily.      Marland Kitchen lamoTRIgine (LAMICTAL) 150 MG tablet Take 200 mg by mouth at bedtime. For mood  stabilization.      Marland Kitchen lurasidone (LATUDA) 80 MG TABS Take 1 tablet (80 mg total) by mouth daily at 6 PM. For psychosis and mood stabilization.  30 tablet  0  . metoprolol succinate (TOPROL-XL) 100 MG 24 hr tablet Take 1 tablet (100 mg total) by mouth daily. Take with or immediately following a meal for blood pressure control.  30 tablet  0  . Multiple Vitamin (MULTIVITAMIN WITH MINERALS) TABS Take 1 tablet by mouth daily. For nutritional supplementation.  30 tablet  0  . mupirocin ointment (BACTROBAN) 2 % as needed.       . nystatin-triamcinolone (MYCOLOG II) cream as needed.      . promethazine (PHENERGAN) 25 MG tablet Take 1 tablet (25 mg total) by mouth every 6 (six) hours as needed for nausea.  30 tablet  6  . SUMAtriptan (IMITREX) 6 MG/0.5ML SOLN injection as needed.      . zolpidem (AMBIEN) 10 MG tablet Take 1 tablet (10 mg total) by mouth at bedtime. For sleep.  30 tablet  0   Current Facility-Administered Medications on File Prior to Visit  Medication Dose Route Frequency Provider Last Rate Last Dose  . valproate (DEPACON) 1,000 mg in sodium chloride  0.9 % 100 mL IVPB  1,000 mg Intravenous Continuous Melvyn Novas, MD   1,000 mg at 09/27/12 1225    Past Surgical History  Procedure Laterality Date  . Tumor resection left thigh    . Ankle surgery  12/88    left   . Lipoma removal    . Ganglion cyst excision  2011  . Chest nodule  1990?    rt chest wall nodule removal  . Shoulder surgery  01/13/2011    right, partial tear  . Right bunioectomy      Allergies  Allergen Reactions  . Adhesive [Tape] Itching and Rash    Also reacted to Steri Strips and Band-Aids.  . Dilaudid [Hydromorphone Hcl] Itching  . Morphine Nausea And Vomiting  . Penicillins Hives  . Percocet [Oxycodone-Acetaminophen] Itching  . Prednisone Hives  . Provera [Medroxyprogesterone Acetate] Other (See Comments)    Causes manic episodes  . Ultram [Tramadol Hcl] Itching    History   Social History  .  Marital Status: Single    Spouse Name: N/A    Number of Children: 0  . 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 Smoking status: Never Smoker   . Smokeless tobacco: Never Used  . Alcohol Use: No  . Drug Use: No  . Sexual Activity: Yes    Birth Control/ Protection: Pill   Other Topics Concern  . Not on file   Social History Narrative  . No narrative on file    Family History  Problem Relation Age of Onset  . Hypertension Mother   . Hyperlipidemia Mother   . Heart attack Father   . Heart disease Father   . Hypertension Father   . Diabetes Paternal Grandfather   . Heart disease Maternal Aunt   . Breast cancer Maternal Aunt   . Heart disease Maternal Grandmother   . Cancer Maternal Grandmother     colon    BP 131/93  Pulse 76  Ht 5\' 10"  (1.778 m)  Wt 222 lb (100.699 kg)  BMI 31.85 kg/m2  Review of Systems: See HPI above.    Objective:  Physical Exam:  Gen: NAD  Right leg: No gross deformity, swelling, bruising. TTP anterior distal quad above tendon though.  No other TTP knee, hip, leg. FROM knee and hip. Mild pain with knee extension and hip flexion.  No other pain with motions. NVI distally.    Assessment & Plan:  1. Right quad strain - compression sleeve, heat, home exercises reviewed.  Discussed physical therapy (declined for now).  Should resolve over next 4-6 weeks.  F/u prn.

## 2012-12-30 NOTE — Progress Notes (Signed)
Subjective:     Patient ID: [name], female   DOB: May 11, 1970, 42 y.o.   MRN: [id number]  HPI patient states I'm feeling much better. States that the incision is no longer aspect as it was and she is walking without pain   Review of Systems     Objective:   Physical Exam Neurovascular status intact. Range of motion first MPJ excellent with 35 of dorsiflexion 25 of plantarflexion and incision site which is thinning with no ropey appearance    Assessment:     Improving first MPJ osteotomy with good range of motion and incision site which is healing well    Plan:     Patient allowed to return to normal activities and patient x-rays reviewed. Reappoint as needed

## 2012-12-30 NOTE — Assessment & Plan Note (Signed)
Right quad strain - compression sleeve, heat, home exercises reviewed.  Discussed physical therapy (declined for now).  Should resolve over next 4-6 weeks.  F/u prn.

## 2012-12-30 NOTE — Progress Notes (Signed)
° °  Subjective:    Patient ID: [name], female    DOB: 24-Aug-1970, 42 y.o.   MRN: [id number]  HPI My right foot is pretty good, and it hurt last night for the first time in a while and i put ice on it and i have fallen 3 times since end of June     Review of Systems     Objective:   Physical Exam        Assessment & Plan:

## 2013-01-03 ENCOUNTER — Encounter: Payer: Self-pay | Admitting: *Deleted

## 2013-01-08 ENCOUNTER — Ambulatory Visit (INDEPENDENT_AMBULATORY_CARE_PROVIDER_SITE_OTHER): Payer: BC Managed Care – PPO | Admitting: Neurology

## 2013-01-08 ENCOUNTER — Encounter: Payer: Self-pay | Admitting: Neurology

## 2013-01-08 VITALS — BP 123/82 | HR 79 | Resp 16 | Ht 69.0 in | Wt 226.0 lb

## 2013-01-08 DIAGNOSIS — M792 Neuralgia and neuritis, unspecified: Secondary | ICD-10-CM

## 2013-01-08 DIAGNOSIS — G44009 Cluster headache syndrome, unspecified, not intractable: Secondary | ICD-10-CM

## 2013-01-08 DIAGNOSIS — G43901 Migraine, unspecified, not intractable, with status migrainosus: Secondary | ICD-10-CM

## 2013-01-08 DIAGNOSIS — F609 Personality disorder, unspecified: Secondary | ICD-10-CM | POA: Insufficient documentation

## 2013-01-08 DIAGNOSIS — IMO0002 Reserved for concepts with insufficient information to code with codable children: Secondary | ICD-10-CM

## 2013-01-08 HISTORY — DX: Migraine, unspecified, not intractable, with status migrainosus: G43.901

## 2013-01-08 MED ORDER — SUMATRIPTAN SUCCINATE 6 MG/0.5ML ~~LOC~~ SOLN: 6.0000 mg | SUBCUTANEOUS | Status: DC | PRN

## 2013-01-08 MED ORDER — ELETRIPTAN HYDROBROMIDE 40 MG PO TABS: 40.0000 mg | ORAL_TABLET | ORAL | Status: DC | PRN

## 2013-01-08 NOTE — Progress Notes (Signed)
[facility]  Provider:  Dr Inza Mikrut Referring Provider: Katherine Basset, MD Primary Care Physician:  Katherine Basset, MD  Chief Complaint  Patient presents with  . Follow-up    insomnia and migraines     [name] is a 42 y.o. female here as a referral from [facility] , Dr. Lily Peer [facility].  She just graduated with honors from [employer] , her graduation party  Dec 11th.  She continues to see Dr. Terrace Arabia for Botox,  and has had success with the migraine control .  Today, she reported  to have woken up with a migraine. Some Imitrex this morning and she will need a refill today she also still takes [place] and nauseated, and Ambien as necessary for sleep. She is on Lamictal 200 milligrams daily po  for mood stabilization, Latuda daily,  Depakote for migraine prophylaxis, Vistaril, gabapentin which also helps her sleep. She is also on Adderall as needed for attention.  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 For this type of migraines she will use Phenergan first.   Last visit with Dr Terrace Arabia  :   Ordered Facility-Administered Medications     Family History  Problem Relation Age of Onset  . Hypertension Mother   . Hyperlipidemia Mother   . Heart attack Father   . Heart disease Father   . Hypertension Father   . Diabetes Paternal Grandfather   . Heart disease Maternal Aunt   . Breast cancer Maternal Aunt   . Heart disease Maternal Grandmother   . Cancer Maternal Grandmother     colon    Past Medical History  Diagnosis Date  . Bipolar disorder   . Eczema   . Pes planus   . Achilles tendinitis   . Hypertension   . Migraine   . Achilles tendinitis   . Ganglion cyst 09/29/2009    left wrist (2 cyst)  . Arthritis   . Allergy   . Lipoma   . ADHD (attention deficit hyperactivity disorder)   .  Hyperprolactinemia   . Bipolar affective   . Personality disorder     Past Surgical History  Procedure Laterality Date  . Tumor resection left thigh    . Ankle surgery  12/88    left   . Lipoma removal    . Ganglion cyst excision  2011  . Chest nodule  1990?    rt chest wall nodule removal  . Shoulder surgery  01/13/2011    right, partial tear  . Right bunioectomy      Current Outpatient Prescriptions  Medication Sig Dispense Refill  . amphetamine-dextroamphetamine (ADDERALL) 10 MG tablet Take 1 tablet (10 mg total) by mouth 2 (two) times daily at 8am and 2pm. For attention issues.  60 tablet  0  . diclofenac sodium (VOLTAREN) 1 % GEL Apply 4 g topically 4 (four) times daily as needed.  1 Tube  2  . divalproex (DEPAKOTE ER) 500 MG 24 hr tablet Take 2 tablets (1,000 mg total) by mouth at bedtime. For mood stabilization.  60 tablet  0  . Gabapentin, PHN, (GRALISE) 600 MG TABS Take 600 mg by mouth at bedtime.      . hydrochlorothiazide (HYDRODIURIL) 25 MG tablet Take 1 tablet (25 mg total) by mouth daily. For blood pressure control.  30 tablet  0  .  hydrOXYzine (ATARAX/VISTARIL) 50 MG tablet Take 1 tablet (50 mg total) by mouth 2 (two) times daily as needed for anxiety.  60 tablet  0  . lamoTRIgine (LAMICTAL) 150 MG tablet Take 200 mg by mouth at bedtime. For mood stabilization.      Marland Kitchen lurasidone (LATUDA) 80 MG TABS Take 1 tablet (80 mg total) by mouth daily at 6 PM. For psychosis and mood stabilization.  30 tablet  0  . metoprolol succinate (TOPROL-XL) 100 MG 24 hr tablet Take 1 tablet (100 mg total) by mouth daily. Take with or immediately following a meal for blood pressure control.  30 tablet  0  . Multiple Vitamin (MULTIVITAMIN WITH MINERALS) TABS Take 1 tablet by mouth daily. For nutritional supplementation.  30 tablet  0  . mupirocin ointment (BACTROBAN) 2 % as needed.       . nystatin-triamcinolone (MYCOLOG II) cream as needed.      . promethazine (PHENERGAN) 25 MG tablet Take 1  tablet (25 mg total) by mouth every 6 (six) hours as needed for nausea.  30 tablet  6  . SUMAtriptan (IMITREX) 6 MG/0.5ML SOLN injection as needed.      . zolpidem (AMBIEN) 10 MG tablet Take 1 tablet (10 mg total) by mouth at bedtime. For sleep.  30 tablet  0   Current Facility-Administered Medications  Medication Dose Route Frequency Provider Last Rate Last Dose  . valproate (DEPACON) 1,000 mg in sodium chloride 0.9 % 100 mL IVPB  1,000 mg Intravenous Continuous Melvyn Novas, MD   1,000 mg at 09/27/12 1225    Allergies as of 01/08/2013 - Review Complete 01/08/2013  Allergen Reaction Noted  . Adhesive [tape] Itching and Rash 09/12/2010  . Dilaudid [hydromorphone hcl] Itching 04/18/2011  . Morphine Nausea And Vomiting 04/06/2009  . Penicillins Hives 04/06/2009  . Percocet [oxycodone-acetaminophen] Itching 04/18/2011  . Prednisone Hives 10/15/2009  . Provera [medroxyprogesterone acetate] Other (See Comments) 08/08/2010  . Ultram [tramadol hcl] Itching 04/18/2011    Vitals: BP 123/82  Pulse 79  Resp 16  Ht 5\' 9"  (1.753 m)  Wt 226 lb (102.513 kg)  BMI 33.36 kg/m2 Last Weight:  Wt Readings from Last 1 Encounters:  01/08/13 226 lb (102.513 kg)   Last Height:   Ht Readings from Last 1 Encounters:  01/08/13 5\' 9"  (1.753 m)    Mental Status: Alert, oriented, thought content appropriate.  Speech fluent without evidence of aphasia. Able to follow 3 step commands without difficulty. Cranial Nerves: II-Discs flat bilaterally. Visual fields grossly intact. III/IV/VI-Extraocular movements intact.  Pupils reactive bilaterally. V/VII-Smile symmetric VIII-grossly intact IX/X-normal gag XI-bilateral shoulder shrug XII-midline tongue extension Motor: 5/5 bilaterally with normal tone and bulk Sensory: Pinprick and light touch intact throughout, bilaterally Deep Tendon Reflexes: 2+ and symmetric throughout Plantars: Downgoing bilaterally Cerebellar: Normal finger-to-nose, normal rapid  alternating movements and normal heel-to-shin test.  Normal gait and station.   Assessment / Plan :  1)migraine and neuralgic pain, occipital. Associated with spasms and visual deficits, aura. Nausea. Photophobia.   Botox worked the first time , but not since. / She will need  a refill on Imitrex.   2) her stress level is significantly reduced. ?She graduated and has been offered a job, feels relief.   3) No amnestic events.  /Family history of dementia in maternal aunt and grandmother.

## 2013-01-08 NOTE — Patient Instructions (Signed)
Bickerstaff's Syndrome (Basilar Migraine) CAUSES  When migraine affects the circulation in back of the brain or neck, it can cause basilar migraine or Bickerstaff's syndrome.  These migraines are not treated with triptans.  You should use Phenergan for this type of migraine.   SYMPTOMS  It occurs most frequently in young women. Symptoms include:  Dizziness.  Double vision.  Loss of balance.  Confusion.  Slurred speech.  Fainting.  Disorientation.  During the severe (acute) headache, some people lose consciousness. Often these patients are mistakenly thought to be intoxicated, under the influence of drugs, or suffering from other conditions. A previous history of migraine is helpful in making the diagnosis.   TREATMENT  Basilar migraines are treated medically the same as all other migraines. HOME CARE INSTRUCTIONS   If this is your first diagnosed migraine headache, you may simply choose to wait and watch. You can wait to see if you have another headache before deciding on a further treatment.  You may consult your caregiver or do as suggested by the current treating caregiver.  Numerous medications can prevent these headaches if they are recurrent or should they become recurrent. Your caregiver can help you with a medication or treatment program that will be helpful to you.  If this has been a chronic (long-standing) condition, using continuous narcotics is not recommended. Using long-term narcotics can cause recurrent migraines. Narcotics are a temporary measure only. They are used for the infrequent migraine that fails to respond to all other measures. SEEK IMMEDIATE MEDICAL CARE IF:   You do not get relief from the medications given to you or you have a recurrence of pain.  You have an unexplained oral temperature above 102 F (38.9 C), or as your caregiver suggests.  You have a stiff neck.  You have loss of vision.  You have muscular weakness.  You have loss of  muscular control.  You develop severe symptoms different from your first symptoms.  You start losing your balance or have trouble walking.  You feel faint or pass out. MAKE SURE YOU:   Understand these instructions.  Will watch your condition.  Will get help right away if you are not doing well or get worse. Document Released: 01/16/2005 Document Revised: 04/10/2011 Document Reviewed: 09/04/2007 [facility] Patient Information 2014 [place], [place]. Migraine Headache A migraine headache is an intense, throbbing pain on one or both sides of your head. A migraine can last for 30 minutes to several hours. CAUSES  The exact cause of a migraine headache is not always known. However, a migraine may be caused when nerves in the brain become irritated and release chemicals that cause inflammation. This causes pain. SYMPTOMS  Pain on one or both sides of your head.  Pulsating or throbbing pain.  Severe pain that prevents daily activities.  Pain that is aggravated by any physical activity.  Nausea, vomiting, or both.  Dizziness.  Pain with exposure to bright lights, loud noises, or activity.  General sensitivity to bright lights, loud noises, or smells. Before you get a migraine, you may get warning signs that a migraine is coming (aura). An aura may include:  Seeing flashing lights.  Seeing bright spots, halos, or zig-zag lines.  Having tunnel vision or blurred vision.  Having feelings of numbness or tingling.  Having trouble talking.  Having muscle weakness. MIGRAINE TRIGGERS  Alcohol.  Smoking.  Stress.  Menstruation.  Aged cheeses.  Foods or drinks that contain nitrates, glutamate, aspartame, or tyramine.  Lack of sleep.  Chocolate.  Caffeine.  Hunger.  Physical exertion.  Fatigue.  Medicines used to treat chest pain (nitroglycerine), birth control pills, estrogen, and some blood pressure medicines. DIAGNOSIS  A migraine headache is often diagnosed  based on:  Symptoms.  Physical examination.  A CT scan or MRI of your head. TREATMENT Medicines may be given for pain and nausea. Medicines can also be given to help prevent recurrent migraines.  HOME CARE INSTRUCTIONS  Only take over-the-counter or prescription medicines for pain or discomfort as directed by your caregiver. The use of long-term narcotics is not recommended.  Lie down in a dark, quiet room when you have a migraine.  Keep a journal to find out what may trigger your migraine headaches. For example, write down:  What you eat and drink.  How much sleep you get.  Any change to your diet or medicines.  Limit alcohol consumption.  Quit smoking if you smoke.  Get 7 to 9 hours of sleep, or as recommended by your caregiver.  Limit stress.  Keep lights dim if bright lights bother you and make your migraines worse. SEEK IMMEDIATE MEDICAL CARE IF:   Your migraine becomes severe.  You have a fever.  You have a stiff neck.  You have vision loss.  You have muscular weakness or loss of muscle control.  You start losing your balance or have trouble walking.  You feel faint or pass out.  You have severe symptoms that are different from your first symptoms. MAKE SURE YOU:   Understand these instructions.  Will watch your condition.  Will get help right away if you are not doing well or get worse. Document Released: 01/16/2005 Document Revised: 04/10/2011 Document Reviewed: 01/06/2011 [facility] Patient Information 2014 [place], [place].

## 2013-01-27 ENCOUNTER — Ambulatory Visit (INDEPENDENT_AMBULATORY_CARE_PROVIDER_SITE_OTHER): Payer: BC Managed Care – PPO | Admitting: Sports Medicine

## 2013-01-27 ENCOUNTER — Encounter: Payer: Self-pay | Admitting: Sports Medicine

## 2013-01-27 VITALS — BP 118/80 | HR 82 | Ht 69.0 in | Wt 226.0 lb

## 2013-01-27 DIAGNOSIS — M7062 Trochanteric bursitis, left hip: Secondary | ICD-10-CM

## 2013-01-27 DIAGNOSIS — M76899 Other specified enthesopathies of unspecified lower limb, excluding foot: Secondary | ICD-10-CM

## 2013-01-27 NOTE — Progress Notes (Signed)
   Subjective:    Patient ID: [name], female    DOB: Feb 21, 1970, 42 y.o.   MRN: [id number]  HPI Patient comes in today for followup on left hip pain. Anterior hip pain has resolved. She still getting intermittent lateral hip pain particularly with standing or walking for long periods of time. It is intermittent. No groin pain. She has a history of greater trochanteric bursitis in this hip. Symptoms are identical to what she's experienced previously. She has recently graduated from [employer]. Currently unemployed but has applied for 3 different jobs at [employer].    Review of Systems     Objective:   Physical Exam Well-developed, no acute distress  Left hip: Smooth painless hip range of motion with a negative log roll. No bony or soft tissue tenderness to direct palpation in the anterior hip. There is tenderness to palpation directly over the greater trochanteric bursa. Neurovascularly intact distally.       Assessment & Plan:  1. Lateral left hip pain secondary to greater trochanteric bursitis  Patient admits that she has not been very compliant with her home exercises. I have reiterated the importance of those and re- educated her in IT band stretching and hip abductor strengthening. Her pain is not severe enough today to do a cortisone injection but she understands that this is an option down the road if symptoms warrant. I will plan on seeing her again in 6 weeks or sooner if needed.

## 2013-03-10 ENCOUNTER — Encounter: Payer: Self-pay | Admitting: Sports Medicine

## 2013-03-10 ENCOUNTER — Ambulatory Visit (INDEPENDENT_AMBULATORY_CARE_PROVIDER_SITE_OTHER): Payer: BC Managed Care – PPO | Admitting: Sports Medicine

## 2013-03-10 VITALS — Ht 69.0 in | Wt 226.0 lb

## 2013-03-10 DIAGNOSIS — M76899 Other specified enthesopathies of unspecified lower limb, excluding foot: Secondary | ICD-10-CM

## 2013-03-10 DIAGNOSIS — M7060 Trochanteric bursitis, unspecified hip: Secondary | ICD-10-CM

## 2013-03-10 NOTE — Progress Notes (Signed)
   Subjective:    Patient ID: Kadija Cruzen Musial, female    DOB: 1970/05/10, 43 y.o.   MRN: [id number]  HPI Patient comes in today for followup on left hip pain. Anterior hip pain has resolved. She still has intermittent lateral hip pain which is worse with cold weather. She again admits that she has not really been compliant with her home exercises. She takes intermittent Advil as needed for her hip pain.    Review of Systems     Objective:   Physical Exam Overweight, no acute distress  Left hip: Smooth painless hip range of motion with a negative log roll. Tender to palpation over the greater trochanteric bursa. She has hip weakness with resisted abduction. Neurovascularly intact distally. Walking without a limp.       Assessment & Plan:  Left hip pain secondary to greater trochanteric bursitis  Symptoms are currently tolerable. I have once again reiterated the importance of hip abductor strengthening. She also understands that we could consider repeat cortisone injections or physical therapy down the road if symptoms worsen. Followup with me in 6 weeks.

## 2013-04-21 ENCOUNTER — Ambulatory Visit: Payer: BC Managed Care – PPO | Admitting: Sports Medicine

## 2013-05-30 ENCOUNTER — Encounter: Payer: Self-pay | Admitting: Family Medicine

## 2013-05-30 ENCOUNTER — Ambulatory Visit (INDEPENDENT_AMBULATORY_CARE_PROVIDER_SITE_OTHER): Payer: BC Managed Care – PPO | Admitting: Family Medicine

## 2013-05-30 ENCOUNTER — Ambulatory Visit (HOSPITAL_BASED_OUTPATIENT_CLINIC_OR_DEPARTMENT_OTHER)
Admission: RE | Admit: 2013-05-30 | Discharge: 2013-05-30 | Disposition: A | Discharge status: Home / Self Care | Payer: BC Managed Care – PPO | Source: Ambulatory Visit | Attending: Family Medicine | Admitting: Family Medicine

## 2013-05-30 VITALS — BP 119/84 | HR 76 | Ht 70.0 in | Wt 232.0 lb

## 2013-05-30 DIAGNOSIS — M79605 Pain in left leg: Secondary | ICD-10-CM

## 2013-05-30 DIAGNOSIS — M25551 Pain in right hip: Secondary | ICD-10-CM

## 2013-05-30 DIAGNOSIS — M25559 Pain in unspecified hip: Secondary | ICD-10-CM

## 2013-05-30 DIAGNOSIS — W19XXXA Unspecified fall, initial encounter: Secondary | ICD-10-CM | POA: Insufficient documentation

## 2013-05-30 DIAGNOSIS — M79609 Pain in unspecified limb: Secondary | ICD-10-CM

## 2013-05-30 DIAGNOSIS — Y929 Unspecified place or not applicable: Secondary | ICD-10-CM | POA: Insufficient documentation

## 2013-05-30 NOTE — Patient Instructions (Signed)
The x-rays of your hip were negative for a fracture. You have a hip pointer on the right, hip flexor strain on the left. Should take 3-4 weeks for these to resolve. Can start standing hip rotations and side raises on the left when tolerated - 3 sets of 10 once a day for next 6 weeks. Icing both 15 minutes at a time 3-4 times a day. Activities as tolerated. Ibuprofen 600mg  three times a day with food OR aleve 2 tabs twice a day with food for pain and inflammation. Follow up with me as needed.

## 2013-06-03 ENCOUNTER — Encounter: Payer: Self-pay | Admitting: Family Medicine

## 2013-06-03 DIAGNOSIS — M79605 Pain in left leg: Secondary | ICD-10-CM | POA: Insufficient documentation

## 2013-06-03 DIAGNOSIS — M25551 Pain in right hip: Secondary | ICD-10-CM | POA: Insufficient documentation

## 2013-06-03 NOTE — Assessment & Plan Note (Signed)
2/2 hip pointer.  Icing, nsaids, home exercise program reviewed.

## 2013-06-03 NOTE — Progress Notes (Signed)
Patient ID: [name], female   DOB: 07-02-1970, 43 y.o.   MRN: [id number]  PCP: Vesta Mixer, MD  Subjective:   HPI: Patient is a 43 y.o. female here for left leg, right hip injuries.  Patient reports on Friday 5/1 she was in a raised garden (6 inches elevated). Her mom started weed eater which scared her and caused her to jump up with left leg, go off garden and land onto right hip. No swelling or bruising. Left leg pain is anterior thigh, groin and right hip pain is lateral. Able to walk better currently. No numbness/tingling. No radiation of pain.  Past Medical History  Diagnosis Date  . Bipolar disorder   . Eczema   . Pes planus   . Achilles tendinitis   . Hypertension   . Migraine   . Achilles tendinitis   . Ganglion cyst 09/29/2009    left wrist (2 cyst)  . Arthritis   . Allergy   . Lipoma   . ADHD (attention deficit hyperactivity disorder)   . Hyperprolactinemia   . Bipolar affective   . Personality disorder     Current Outpatient Prescriptions on File Prior to Visit  Medication Sig Dispense Refill  . amphetamine-dextroamphetamine (ADDERALL) 10 MG tablet Take 1 tablet (10 mg total) by mouth 2 (two) times daily at 8am and 2pm. For attention issues.  60 tablet  0  . divalproex (DEPAKOTE ER) 500 MG 24 hr tablet Take 2 tablets (1,000 mg total) by mouth at bedtime. For mood stabilization.  60 tablet  0  . Gabapentin, PHN, (GRALISE) 600 MG TABS Take 600 mg by mouth at bedtime.      . hydrochlorothiazide (HYDRODIURIL) 25 MG tablet Take 1 tablet (25 mg total) by mouth daily. For blood pressure control.  30 tablet  0  . hydrOXYzine (VISTARIL) 25 MG capsule Take 25 mg by mouth as needed. 1-2 tablets      . lamoTRIgine (LAMICTAL) 150 MG tablet Take 200 mg by mouth at bedtime. For mood stabilization.      Marland Kitchen lurasidone (LATUDA) 80 MG TABS Take 1 tablet (80 mg total) by mouth daily at 6 PM. For psychosis and mood stabilization.  30 tablet  0  . metoprolol succinate  (TOPROL-XL) 100 MG 24 hr tablet Take 1 tablet (100 mg total) by mouth daily. Take with or immediately following a meal for blood pressure control.  30 tablet  0  . Multiple Vitamin (MULTIVITAMIN WITH MINERALS) TABS Take 1 tablet by mouth daily. For nutritional supplementation.  30 tablet  0  . promethazine (PHENERGAN) 25 MG tablet Take 1 tablet (25 mg total) by mouth every 6 (six) hours as needed for nausea.  30 tablet  6  . SUMAtriptan (IMITREX) 6 MG/0.5ML SOLN injection Inject 0.5 mLs (6 mg total) into the skin as needed.  6 vial  2  . zolpidem (AMBIEN) 10 MG tablet Take 1 tablet (10 mg total) by mouth at bedtime. For sleep.  30 tablet  0   Current Facility-Administered Medications on File Prior to Visit  Medication Dose Route Frequency Provider Last Rate Last Dose  . valproate (DEPACON) 1,000 mg in sodium chloride 0.9 % 100 mL IVPB  1,000 mg Intravenous Continuous Larey Seat, MD   1,000 mg at 09/27/12 1225    Past Surgical History  Procedure Laterality Date  . Tumor resection left thigh    . Ankle surgery  12/88    left   . Lipoma removal    .  Ganglion cyst excision  2011  . Chest nodule  1990?    rt chest wall nodule removal  . Shoulder surgery  01/13/2011    right, partial tear  . Right bunioectomy      Allergies  Allergen Reactions  . Adhesive [Tape] Itching and Rash    Also reacted to Steri Strips and Band-Aids.  . Dilaudid [Hydromorphone Hcl] Itching  . Morphine Nausea And Vomiting  . Penicillins Hives  . Percocet [Oxycodone-Acetaminophen] Itching  . Prednisone Hives  . Provera [Medroxyprogesterone Acetate] Other (See Comments)    Causes manic episodes  . Ultram [Tramadol Hcl] Itching    History   Social History  . Marital Status: Single    Spouse Name: N/A    Number of Children: 0  . 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 Smoking status: Never Smoker   . Smokeless tobacco: Never Used  . Alcohol Use: No   . Drug Use: No  . Sexual Activity: Yes    Birth Control/ Protection: Pill   Other Topics Concern  . Not on file   Social History Narrative  . No narrative on file    Family History  Problem Relation Age of Onset  . Hypertension Mother   . Hyperlipidemia Mother   . Heart attack Father   . Heart disease Father   . Hypertension Father   . Diabetes Paternal Grandfather   . Heart disease Maternal Aunt   . Breast cancer Maternal Aunt   . Heart disease Maternal Grandmother   . Cancer Maternal Grandmother     colon    BP 119/84  Pulse 76  Ht 5\' 10"  (1.778 m)  Wt 232 lb (105.235 kg)  BMI 33.29 kg/m2  Review of Systems: See HPI above.    Objective:  Physical Exam:  Gen: NAD  Back/hips: No gross deformity, scoliosis. TTP right greater trochanter, left ant groin over hip flexor, proximal quad.  No midline or bony TTP.  No back tenderness. FROM back without pain.  Negative logrolls of hips. Strength LEs 5/5 all muscle groups except 4/5 right hip abduction with some pain.  Pain with left hip flexion, minimal with knee extension. Negative SLRs. Sensation intact to light touch bilaterally. Negative fabers and piriformis stretches.    Assessment & Plan:  1. Left leg pain - 2/2 hip flexor strain.  Radiographs negative.  Icing, nsaids, home exercise program.  Consider PT if not improving.  F/u prn.  2. Right hip pain - 2/2 hip pointer.  Icing, nsaids, home exercise program reviewed.

## 2013-06-03 NOTE — Assessment & Plan Note (Signed)
2/2 hip flexor strain.  Radiographs negative.  Icing, nsaids, home exercise program.  Consider PT if not improving.  F/u prn.

## 2013-06-16 ENCOUNTER — Encounter: Payer: Self-pay | Admitting: *Deleted

## 2013-06-19 ENCOUNTER — Ambulatory Visit (INDEPENDENT_AMBULATORY_CARE_PROVIDER_SITE_OTHER): Payer: BC Managed Care – PPO | Admitting: Sports Medicine

## 2013-06-19 ENCOUNTER — Ambulatory Visit
Admission: RE | Admit: 2013-06-19 | Discharge: 2013-06-19 | Disposition: A | Discharge status: Home / Self Care | Payer: BC Managed Care – PPO | Source: Ambulatory Visit | Attending: Sports Medicine | Admitting: Sports Medicine

## 2013-06-19 ENCOUNTER — Ambulatory Visit: Payer: Self-pay | Admitting: Sports Medicine

## 2013-06-19 ENCOUNTER — Encounter: Payer: Self-pay | Admitting: Sports Medicine

## 2013-06-19 VITALS — BP 118/83 | Ht 70.0 in | Wt 235.0 lb

## 2013-06-19 DIAGNOSIS — M79605 Pain in left leg: Secondary | ICD-10-CM

## 2013-06-19 DIAGNOSIS — M79609 Pain in unspecified limb: Secondary | ICD-10-CM

## 2013-06-20 ENCOUNTER — Telehealth: Payer: Self-pay | Admitting: *Deleted

## 2013-06-20 NOTE — Telephone Encounter (Signed)
Called patient to change appt time on 07-09-13 to see MM/Doh instead of CM since cm has never seen the patient. Left message for her to call the office when she gets a min.

## 2013-06-20 NOTE — Progress Notes (Signed)
   Subjective:    Patient ID: [name], female    DOB: 1970-05-08, 43 y.o.   MRN: [id number]  HPI chief complaint: Posterior left leg pain.  Patient comes in today complaining of one week of posterior left leg pain. She had an acute onset of pain in the left hamstring while mowing her yard. She describes it as a "grabbing feeling". Since then she hasn't had intermittent pain and spasm. She localizes her discomfort to the midportion of the hamstring. No other pain throughout the leg. No low back pain. No associated numbness or tingling. 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 Leonides Schanz at [facility]. In fact, she had undergo a second surgery after a postoperative infection which left her with some deformity of the hamstring area. She had serial MRIs for a period of 5 years to ensure that the tumor had not returned. She was eventually given a clean bill of health and has not had any issues up until last week. She is concerned that the tumor may have returned. No fevers or chills. No recent weight loss.    Review of Systems     Objective:   Physical Exam Overweight. No acute distress.  Left hamstring: There is a definite palpable defect in the midportion of the hamstring tendon. There is a well-healed surgical incision from her prior surgery. There is also a questionable palpable mass in the same area. She has good hamstring strength but it does reproduce pain. No ecchymosis. No soft tissue swelling. Neurovascularly intact distally. Walking without a limp.  X-rays of the left femur including AP and lateral views are obtained today. They are unremarkable.       Assessment & Plan:   left hamstring strain-rule out returning plexiform histiocytic tumor  Given the history above I think it would be wise to pursue an MRI of the left femur/hamstring to rule out a returning soft tissue mass. I will call her with  those results once available. If no mass is seen, we will proceed with treatment for simple hamstring strain.

## 2013-06-24 ENCOUNTER — Other Ambulatory Visit (HOSPITAL_COMMUNITY)
Admission: RE | Admit: 2013-06-24 | Discharge: 2013-06-24 | Disposition: A | Discharge status: Home / Self Care | Payer: BC Managed Care – PPO | Source: Ambulatory Visit | Attending: Family Medicine | Admitting: Family Medicine

## 2013-06-24 ENCOUNTER — Other Ambulatory Visit: Payer: Self-pay | Admitting: Family Medicine

## 2013-06-24 DIAGNOSIS — Z124 Encounter for screening for malignant neoplasm of cervix: Secondary | ICD-10-CM | POA: Insufficient documentation

## 2013-06-24 DIAGNOSIS — Z1151 Encounter for screening for human papillomavirus (HPV): Secondary | ICD-10-CM | POA: Insufficient documentation

## 2013-06-27 ENCOUNTER — Ambulatory Visit
Admission: RE | Admit: 2013-06-27 | Discharge: 2013-06-27 | Disposition: A | Discharge status: Home / Self Care | Payer: BC Managed Care – PPO | Source: Ambulatory Visit | Attending: Sports Medicine | Admitting: Sports Medicine

## 2013-06-27 DIAGNOSIS — M79605 Pain in left leg: Secondary | ICD-10-CM

## 2013-06-27 MED ORDER — GADOBENATE DIMEGLUMINE 529 MG/ML IV SOLN
20.0000 mL | Freq: Once | INTRAVENOUS | Status: AC | PRN
  Administered 2013-06-27: 20 mL via INTRAVENOUS

## 2013-06-30 NOTE — Telephone Encounter (Signed)
I spoke with the patient on the phone today after reviewing the MRI of her left femur. No evidence of tumor. She does have some scar tissue from her previous surgery. She is reassured by this news. She will get a thigh sleeve for compression and give this another week or 2. If still symptomatic, she can return to the office and we will go over a home exercise program for hamstring strain.

## 2013-07-01 ENCOUNTER — Encounter: Payer: Self-pay | Admitting: Adult Health

## 2013-07-01 ENCOUNTER — Ambulatory Visit (INDEPENDENT_AMBULATORY_CARE_PROVIDER_SITE_OTHER): Payer: BC Managed Care – PPO | Admitting: Adult Health

## 2013-07-01 VITALS — BP 123/86 | HR 75 | Ht 69.0 in | Wt 231.0 lb

## 2013-07-01 DIAGNOSIS — G43909 Migraine, unspecified, not intractable, without status migrainosus: Secondary | ICD-10-CM

## 2013-07-01 DIAGNOSIS — Z9181 History of falling: Secondary | ICD-10-CM

## 2013-07-01 DIAGNOSIS — R42 Dizziness and giddiness: Secondary | ICD-10-CM

## 2013-07-01 DIAGNOSIS — R2681 Unsteadiness on feet: Secondary | ICD-10-CM

## 2013-07-01 DIAGNOSIS — R269 Unspecified abnormalities of gait and mobility: Secondary | ICD-10-CM

## 2013-07-01 DIAGNOSIS — R296 Repeated falls: Secondary | ICD-10-CM

## 2013-07-01 MED ORDER — SUMATRIPTAN SUCCINATE 6 MG/0.5ML ~~LOC~~ SOLN: 6.0000 mg | SUBCUTANEOUS | Status: DC | PRN

## 2013-07-01 NOTE — Progress Notes (Signed)
PATIENT: [name] DOB: 1970-06-12  REASON FOR VISIT: follow up HISTORY FROM: patient  HISTORY OF PRESENT ILLNESS: [name] is a 43 year old female with a history of migraines. She was initially referred here by [facility] student health- Dr. Toney Rakes. She is currently on Depakote for migraine prophylaxis and Imitrex when needed. She also uses Phenergan for nausea. She was getting Botox injections but reported that after the first round it stopped working. She is on Lamictal and latuda for mood stabilization. She takes Adderall for attention deficit. She reports that she has 12-15 headaches per month. Has photophobia and phonophobia with migraines. Does have nausea but no vomiting with migraines. The weather, chocolate and bright lights can bring on a migraine. Patient has a history of gait imbalance and dizziness. However she feels that her gait has gotten worse. She has had multiple falls and tends to run into things. She has not sustained any injuries from these falls. States that her mother has noticed that she runs into things frequently. No new medical issues since last seen.   REVIEW OF SYSTEMS: Full 14 system review of systems performed and notable only for:  Constitutional: excessive sweating Eyes: light sensitivity, blurred vision d/t floaters Ear/Nose/Throat: N/A  Skin: wounds Cardiovascular: N/A  Respiratory: N/A  Gastrointestinal: N/A  Genitourinary: N/A Hematology/Lymphatic: bruise and bleed easily Endocrine: N/A Musculoskeletal:joint pain, muscle cramps, walking difficulty and neck pain.  Allergy/Immunology: N/A  Neurological: dizziness, headache, tremors Psychiatric: agitation, decreased concentration, depression, nervous/anxious, suicidal thoughts Sleep: insomnia    ALLERGIES: Allergies  Allergen Reactions  . Adhesive [Tape] Itching and Rash    Also reacted to Steri Strips and Band-Aids.  . Dilaudid [Hydromorphone Hcl] Itching  . Morphine Nausea And Vomiting   . Penicillins Hives  . Percocet [Oxycodone-Acetaminophen] Itching  . Prednisone Hives  . Provera [Medroxyprogesterone Acetate] Other (See Comments)    Causes manic episodes  . Ultram [Tramadol Hcl] Itching    HOME MEDICATIONS: Outpatient Prescriptions Prior to Visit  Medication Sig Dispense Refill  . amphetamine-dextroamphetamine (ADDERALL) 10 MG tablet Take 1 tablet (10 mg total) by mouth 2 (two) times daily at 8am and 2pm. For attention issues.  60 tablet  0  . divalproex (DEPAKOTE ER) 500 MG 24 hr tablet Take 2 tablets (1,000 mg total) by mouth at bedtime. For mood stabilization.  60 tablet  0  . Gabapentin, PHN, (GRALISE) 600 MG TABS Take 600 mg by mouth at bedtime.      . hydrochlorothiazide (HYDRODIURIL) 25 MG tablet Take 1 tablet (25 mg total) by mouth daily. For blood pressure control.  30 tablet  0  . hydrOXYzine (VISTARIL) 25 MG capsule Take 25 mg by mouth as needed. 1-2 tablets      . lurasidone (LATUDA) 80 MG TABS Take 1 tablet (80 mg total) by mouth daily at 6 PM. For psychosis and mood stabilization.  30 tablet  0  . metoprolol succinate (TOPROL-XL) 100 MG 24 hr tablet Take 1 tablet (100 mg total) by mouth daily. Take with or immediately following a meal for blood pressure control.  30 tablet  0  . Multiple Vitamin (MULTIVITAMIN WITH MINERALS) TABS Take 1 tablet by mouth daily. For nutritional supplementation.  30 tablet  0  . promethazine (PHENERGAN) 25 MG tablet Take 1 tablet (25 mg total) by mouth every 6 (six) hours as needed for nausea.  30 tablet  6  . SUMAtriptan (IMITREX) 6 MG/0.5ML SOLN injection Inject 0.5 mLs (6 mg total) into  the skin as needed.  6 vial  2  . zolpidem (AMBIEN) 10 MG tablet Take 1 tablet (10 mg total) by mouth at bedtime. For sleep.  30 tablet  0  . lamoTRIgine (LAMICTAL) 150 MG tablet Take 200 mg by mouth at bedtime. For mood stabilization.       Facility-Administered Medications Prior to Visit  Medication Dose Route Frequency Provider Last Rate  Last Dose  . valproate (DEPACON) 1,000 mg in sodium chloride 0.9 % 100 mL IVPB  1,000 mg Intravenous Continuous Larey Seat, MD   1,000 mg at 09/27/12 1225    PAST MEDICAL HISTORY: Past Medical History  Diagnosis Date  . Bipolar disorder   . Eczema   . Pes planus   . Achilles tendinitis   . Hypertension   . Migraine   . Achilles tendinitis   . Ganglion cyst 09/29/2009    left wrist (2 cyst)  . Arthritis   . Allergy   . Lipoma   . ADHD (attention deficit hyperactivity disorder)   . Hyperprolactinemia   . Bipolar affective   . Personality disorder     PAST SURGICAL HISTORY: Past Surgical History  Procedure Laterality Date  . Tumor resection left thigh    . Ankle surgery  12/88    left   . Lipoma removal    . Ganglion cyst excision  2011  . Chest nodule  1990?    rt chest wall nodule removal  . Shoulder surgery  01/13/2011    right, partial tear  . Right bunioectomy      FAMILY HISTORY: Family History  Problem Relation Age of Onset  . Hypertension Mother   . Hyperlipidemia Mother   . Heart attack Father   . Heart disease Father   . Hypertension Father   . Diabetes Paternal Grandfather   . Heart disease Maternal Aunt   . Breast cancer Maternal Aunt   . Heart disease Maternal Grandmother   . Cancer Maternal Grandmother     colon    SOCIAL HISTORY: History   Social History  . Marital Status: Single    Spouse Name: N/A    Number of Children: 0  . 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 Smoking status: Never Smoker   . Smokeless tobacco: Never Used  . Alcohol Use: No  . Drug Use: No  . Sexual Activity: Yes    Birth Control/ Protection: Pill   Other Topics Concern  . Not on file   Social History Narrative  . No narrative on file      PHYSICAL EXAM  Filed Vitals:   07/01/13 1327 07/01/13 1335 07/01/13 1336  BP:  124/87 123/86  Pulse:  69 75  Height: 5\' 9"  (1.753 m)    Weight: 231 lb  (104.781 kg)     Body mass index is 34.1 kg/(m^2).  Generalized: Well developed, in no acute distress   Neurological examination  Mentation: Alert oriented to time, place, history taking. Follows all commands speech and language fluent Cranial nerve II-XII: Pupils were equal round reactive to light. Extraocular movements were full, visual field were full on confrontational test. Facial sensation and strength were normal.  Motor: The motor testing reveals 5 over 5 strength of all 4 extremities. Good symmetric motor tone is noted throughout.  Sensory: Sensory testing is intact to soft touch on all 4 extremities. No evidence of extinction is noted.  Coordination: Cerebellar  testing reveals good finger-nose-finger and heel-to-shin bilaterally.  Gait and station: Gait is normal. Tandem gait is slightly unsteady. Romberg is negative. No drift is seen.  Reflexes: Deep tendon reflexes are symmetric and normal bilaterally.    DIAGNOSTIC DATA (LABS, IMAGING, TESTING) - I reviewed patient records, labs, notes, testing and imaging myself where available.  Lab Results  Component Value Date   WBC 6.3 10/25/2011   HGB 14.6 10/25/2011   HCT 43.4 10/25/2011   MCV 90.4 10/25/2011   PLT 280 10/25/2011      Component Value Date/Time   NA 139 10/25/2011 1325   K 4.3 10/25/2011 1325   CL 100 10/25/2011 1325   CO2 30 10/25/2011 1325   GLUCOSE 104* 10/25/2011 1325   BUN 9 10/25/2011 1325   CREATININE 0.89 10/25/2011 1325   CREATININE 0.83 09/11/2011 1524   CALCIUM 9.9 10/25/2011 1325   PROT 6.6 10/25/2011 1325   ALBUMIN 3.8 10/25/2011 1325   AST 16 10/25/2011 1325   ALT 10 10/25/2011 1325   ALKPHOS 66 10/25/2011 1325   BILITOT 0.3 10/25/2011 1325   GFRNONAA 79* 10/25/2011 1325   GFRAA >90 10/25/2011 1325   Lab Results  Component Value Date   CHOL 164 10/02/2010   HDL 54 10/02/2010   LDLCALC 94 10/02/2010   TRIG 81 10/02/2010   CHOLHDL 3.0 10/02/2010   Lab Results  Component Value Date   HGBA1C 5.3 07/11/2007   No  results found for this basename: [id number]   Lab Results  Component Value Date   TSH 1.725 10/27/2011      ASSESSMENT AND PLAN 43 y.o. year old female  has a past medical history of Bipolar disorder; Eczema; Pes planus; Achilles tendinitis; Hypertension; Migraine; Achilles tendinitis; Ganglion cyst (09/29/2009); Arthritis; Allergy; Lipoma; ADHD (attention deficit hyperactivity disorder); Hyperprolactinemia; Bipolar affective; and Personality disorder. here with  1. Migraines 2. Frequent falls 3. Gait instability 4. Dizziness and giddiness  Patient continues to have 12-15 headaches per month. States that the Imitrex and Depakote have helped some. Will refill Imitrex today. Patient has a history of dizziness and gait imbalance but reports that lately she has noticed an increase in imbalance. States she has had multiple falls and is concerned that something else is going on. Patient had a MRI of the brain in 2011 that was unremarkable. Physical Exam today was unremarkable. We will check blood work today. Patient had CMP recently drawn at her PCP office and states that it was all normal. We will also recheck an MRI of the brain to look for acute changes that could be the cause of gait instability and frequent falls. Patient should follow up in 6 months or sooner if needed.    Ward Givens, MSN, NP-C 07/01/2013, 1:39 PM [facility] [address] [place], [place] [address] [phone]  Note: This document was prepared with digital dictation and possible smart phrase technology. Any transcriptional errors that result from this process are unintentional.

## 2013-07-01 NOTE — Patient Instructions (Addendum)
Migraine Headache A migraine headache is very bad, throbbing pain on one or both sides of your head. Talk to your doctor about what things may bring on (trigger) your migraine headaches. HOME CARE  Only take medicines as told by your doctor.  Lie down in a dark, quiet room when you have a migraine.  Keep a journal to find out if certain things bring on migraine headaches. For example, write down:  What you eat and drink.  How much sleep you get.  Any change to your diet or medicines.  Lessen how much alcohol you drink.  Quit smoking if you smoke.  Get enough sleep.  Lessen any stress in your life.  Keep lights dim if bright lights bother you or make your migraines worse. GET HELP RIGHT AWAY IF:   Your migraine becomes really bad.  You have a fever.  You have a stiff neck.  You have trouble seeing.  Your muscles are weak, or you lose muscle control.  You lose your balance or have trouble walking.  You feel like you will pass out (faint), or you pass out.  You have really bad symptoms that are different than your first symptoms. MAKE SURE YOU:   Understand these instructions.  Will watch your condition.  Will get help right away if you are not doing well or get worse. Document Released: 10/26/2007 Document Revised: 04/10/2011 Document Reviewed: 09/23/2012 [facility] Patient Information 2014 [place]. Fall Prevention and Home Safety Falls cause injuries and can affect all age groups. It is possible to prevent falls.  HOW TO PREVENT FALLS  Wear shoes with rubber soles that do not have an opening for your toes.  Keep the inside and outside of your house well lit.  Use night lights throughout your home.  Remove clutter from floors.  Clean up floor spills.  Remove throw rugs or fasten them to the floor with carpet tape.  Do not place electrical cords across pathways.  Put grab bars by your tub, shower, and toilet. Do not use towel bars as grab  bars.  Put handrails on both sides of the stairway. Fix loose handrails.  Do not climb on stools or stepladders, if possible.  Do not wax your floors.  Repair uneven or unsafe sidewalks, walkways, or stairs.  Keep items you use a lot within reach.  Be aware of pets.  Keep emergency numbers next to the telephone.  Put smoke detectors in your home and near bedrooms. Ask your doctor what other things you can do to prevent falls. Document Released: 11/12/2008 Document Revised: 07/18/2011 Document Reviewed: 04/18/2011 [facility] Patient Information 2014 [place], [place].

## 2013-07-02 LAB — LAMOTRIGINE LEVEL: Lamotrigine Lvl: 8.5 ug/mL (ref 2.0–20.0)

## 2013-07-02 LAB — VALPROIC ACID LEVEL: Valproic Acid Lvl: 76 ug/mL (ref 50–100)

## 2013-07-02 LAB — CBC WITH DIFFERENTIAL
Basophils Absolute: 0.1 10*3/uL (ref 0.0–0.2)
Basos: 1 %
Eos: 1 %
Eosinophils Absolute: 0.1 10*3/uL (ref 0.0–0.4)
HCT: 42.6 % (ref 34.0–46.6)
Hemoglobin: 14.5 g/dL (ref 11.1–15.9)
Immature Grans (Abs): 0 10*3/uL (ref 0.0–0.1)
Immature Granulocytes: 0 %
Lymphocytes Absolute: 2.3 10*3/uL (ref 0.7–3.1)
Lymphs: 34 %
MCH: 30.2 pg (ref 26.6–33.0)
MCHC: 34 g/dL (ref 31.5–35.7)
MCV: 89 fL (ref 79–97)
Monocytes Absolute: 0.6 10*3/uL (ref 0.1–0.9)
Monocytes: 8 %
Neutrophils Absolute: 3.8 10*3/uL (ref 1.4–7.0)
Neutrophils Relative %: 56 %
Platelets: 282 10*3/uL (ref 150–379)
RBC: 4.8 x10E6/uL (ref 3.77–5.28)
RDW: 13.1 % (ref 12.3–15.4)
WBC: 6.8 10*3/uL (ref 3.4–10.8)

## 2013-07-02 LAB — AMMONIA: Ammonia: 69 ug/dL (ref 19–87)

## 2013-07-02 NOTE — Progress Notes (Signed)
I agree with the assessment and plan as directed by NP .The patient is known to me .   Adisson Deak, MD  

## 2013-07-02 NOTE — Progress Notes (Signed)
Quick Note:  Shared normal labs with patient per Ms Millikan's findings, she verbalized understanding ______

## 2013-07-07 ENCOUNTER — Telehealth: Payer: Self-pay | Admitting: Adult Health

## 2013-07-07 NOTE — Telephone Encounter (Signed)
Patient questioning if MRI is needed.  Other test have come back negative.  Please call and advise.

## 2013-07-08 NOTE — Telephone Encounter (Signed)
Spoke with patient and she is asking  should she keep scheduled MRI appt.for tomorrow since all other tests came back negative?

## 2013-07-08 NOTE — Telephone Encounter (Signed)
Patient was advised that if she is continuing to fall and run into objects then we should get an MRI to rule out any acute finding that could cause these symptoms. However, it is up to the patient if she wants to get the MRI or not. Patient verbalized understanding. She has an appointment tomorrow at 11:00 am to get MRI.

## 2013-07-09 ENCOUNTER — Ambulatory Visit: Payer: BC Managed Care – PPO | Admitting: Nurse Practitioner

## 2013-07-09 ENCOUNTER — Ambulatory Visit (INDEPENDENT_AMBULATORY_CARE_PROVIDER_SITE_OTHER): Payer: BC Managed Care – PPO

## 2013-07-09 DIAGNOSIS — Z9181 History of falling: Secondary | ICD-10-CM

## 2013-07-09 DIAGNOSIS — R296 Repeated falls: Secondary | ICD-10-CM

## 2013-07-09 DIAGNOSIS — R2681 Unsteadiness on feet: Secondary | ICD-10-CM

## 2013-07-09 DIAGNOSIS — R269 Unspecified abnormalities of gait and mobility: Secondary | ICD-10-CM

## 2013-07-09 MED ORDER — GADOPENTETATE DIMEGLUMINE 469.01 MG/ML IV SOLN: 20.0000 mL | Freq: Once | INTRAVENOUS | Status: AC | PRN

## 2013-07-10 ENCOUNTER — Telehealth: Payer: Self-pay | Admitting: *Deleted

## 2013-07-10 DIAGNOSIS — R42 Dizziness and giddiness: Secondary | ICD-10-CM

## 2013-07-10 NOTE — Telephone Encounter (Signed)
Relayed MRI normal.  What next for dizziness.  She wants you to call her.

## 2013-07-10 NOTE — Progress Notes (Signed)
Quick Note:  I called and gave results to her that normal MRI. What next for dizziness? ______

## 2013-07-11 NOTE — Addendum Note (Signed)
Addended by: Larey Seat on: 07/11/2013 11:59 AM   Modules accepted: Orders

## 2013-07-11 NOTE — Telephone Encounter (Signed)
I called the patient. She is concerned that she is still having dizziness. This is a chronic issue that has been going on for some time. The patient feels like it has gotten worse. MRI of the brain was normal. Patient has the sensation of the room spinning. Patient states that it is not constant and can happen when she changes positions quickly. Benefited from vestibular rehabilitation in the past. I will make a new referral. Patient is to let us know if this improves her dizziness. She verbalized understanding.

## 2013-07-17 ENCOUNTER — Other Ambulatory Visit: Payer: Self-pay

## 2013-07-17 DIAGNOSIS — Z1231 Encounter for screening mammogram for malignant neoplasm of breast: Secondary | ICD-10-CM

## 2013-07-22 ENCOUNTER — Ambulatory Visit
Admission: RE | Admit: 2013-07-22 | Discharge: 2013-07-22 | Disposition: A | Discharge status: Home / Self Care | Payer: BC Managed Care – PPO | Source: Ambulatory Visit

## 2013-07-22 DIAGNOSIS — Z1231 Encounter for screening mammogram for malignant neoplasm of breast: Secondary | ICD-10-CM

## 2013-08-13 ENCOUNTER — Ambulatory Visit: Payer: Medicare HMO | Attending: Neurology | Admitting: Rehabilitative and Restorative Service Providers"

## 2013-08-13 DIAGNOSIS — IMO0001 Reserved for inherently not codable concepts without codable children: Secondary | ICD-10-CM | POA: Insufficient documentation

## 2013-08-13 DIAGNOSIS — R269 Unspecified abnormalities of gait and mobility: Secondary | ICD-10-CM | POA: Insufficient documentation

## 2013-08-13 DIAGNOSIS — R42 Dizziness and giddiness: Secondary | ICD-10-CM | POA: Insufficient documentation

## 2013-08-22 ENCOUNTER — Ambulatory Visit: Payer: Medicare HMO | Admitting: Rehabilitative and Restorative Service Providers"

## 2013-08-22 DIAGNOSIS — IMO0001 Reserved for inherently not codable concepts without codable children: Secondary | ICD-10-CM | POA: Diagnosis not present

## 2013-08-29 ENCOUNTER — Ambulatory Visit: Payer: Medicare HMO | Admitting: Rehabilitative and Restorative Service Providers"

## 2013-08-29 ENCOUNTER — Emergency Department (HOSPITAL_COMMUNITY): Payer: Medicare HMO

## 2013-08-29 ENCOUNTER — Encounter (HOSPITAL_COMMUNITY): Payer: Self-pay | Admitting: Emergency Medicine

## 2013-08-29 ENCOUNTER — Emergency Department (HOSPITAL_COMMUNITY)
Admission: EM | Admit: 2013-08-29 | Discharge: 2013-08-30 | Disposition: A | Discharge status: Home / Self Care | Payer: Medicare HMO | Attending: Emergency Medicine | Admitting: Emergency Medicine

## 2013-08-29 DIAGNOSIS — R079 Chest pain, unspecified: Secondary | ICD-10-CM | POA: Diagnosis not present

## 2013-08-29 DIAGNOSIS — Z8739 Personal history of other diseases of the musculoskeletal system and connective tissue: Secondary | ICD-10-CM | POA: Insufficient documentation

## 2013-08-29 DIAGNOSIS — F909 Attention-deficit hyperactivity disorder, unspecified type: Secondary | ICD-10-CM | POA: Diagnosis not present

## 2013-08-29 DIAGNOSIS — I10 Essential (primary) hypertension: Secondary | ICD-10-CM | POA: Insufficient documentation

## 2013-08-29 DIAGNOSIS — R319 Hematuria, unspecified: Secondary | ICD-10-CM | POA: Diagnosis not present

## 2013-08-29 DIAGNOSIS — IMO0001 Reserved for inherently not codable concepts without codable children: Secondary | ICD-10-CM | POA: Diagnosis not present

## 2013-08-29 DIAGNOSIS — Z862 Personal history of diseases of the blood and blood-forming organs and certain disorders involving the immune mechanism: Secondary | ICD-10-CM | POA: Diagnosis not present

## 2013-08-29 DIAGNOSIS — Z872 Personal history of diseases of the skin and subcutaneous tissue: Secondary | ICD-10-CM | POA: Insufficient documentation

## 2013-08-29 DIAGNOSIS — Z880 Allergy status to penicillin: Secondary | ICD-10-CM | POA: Insufficient documentation

## 2013-08-29 DIAGNOSIS — Z9109 Other allergy status, other than to drugs and biological substances: Secondary | ICD-10-CM | POA: Insufficient documentation

## 2013-08-29 DIAGNOSIS — Z79899 Other long term (current) drug therapy: Secondary | ICD-10-CM | POA: Diagnosis not present

## 2013-08-29 LAB — I-STAT TROPONIN, ED
Troponin i, poc: 0 ng/mL (ref 0.00–0.08)
Troponin i, poc: 0 ng/mL (ref 0.00–0.08)

## 2013-08-29 LAB — CBC
HCT: 42.1 % (ref 36.0–46.0)
Hemoglobin: 14.1 g/dL (ref 12.0–15.0)
MCH: 30.3 pg (ref 26.0–34.0)
MCHC: 33.5 g/dL (ref 30.0–36.0)
MCV: 90.5 fL (ref 78.0–100.0)
Platelets: 261 10*3/uL (ref 150–400)
RBC: 4.65 MIL/uL (ref 3.87–5.11)
RDW: 12.7 % (ref 11.5–15.5)
WBC: 7.7 10*3/uL (ref 4.0–10.5)

## 2013-08-29 LAB — BASIC METABOLIC PANEL
Anion gap: 11 (ref 5–15)
BUN: 12 mg/dL (ref 6–23)
CO2: 30 mEq/L (ref 19–32)
Calcium: 9.3 mg/dL (ref 8.4–10.5)
Chloride: 99 mEq/L (ref 96–112)
Creatinine, Ser: 0.95 mg/dL (ref 0.50–1.10)
GFR calc Af Amer: 84 mL/min — ABNORMAL LOW (ref >90)
GFR calc non Af Amer: 72 mL/min — ABNORMAL LOW (ref >90)
Glucose, Bld: 95 mg/dL (ref 70–99)
Potassium: 3.7 mEq/L (ref 3.7–5.3)
Sodium: 140 mEq/L (ref 137–147)

## 2013-08-29 LAB — PROTIME-INR
INR: 0.93 (ref 0.00–1.49)
Prothrombin Time: 12.5 seconds (ref 11.6–15.2)

## 2013-08-29 LAB — APTT: aPTT: 29 seconds (ref 24–37)

## 2013-08-29 MED ORDER — SODIUM CHLORIDE 0.9 % IV SOLN
1000.0000 mL | INTRAVENOUS | Status: DC
  Administered 2013-08-29: 1000 mL via INTRAVENOUS

## 2013-08-29 MED ORDER — ASPIRIN 81 MG PO CHEW: 324.0000 mg | CHEWABLE_TABLET | Freq: Once | ORAL | Status: DC

## 2013-08-29 MED ORDER — ONDANSETRON HCL 4 MG/2ML IJ SOLN
4.0000 mg | Freq: Once | INTRAMUSCULAR | Status: AC
  Administered 2013-08-29: 4 mg via INTRAVENOUS
  Filled 2013-08-29: qty 2

## 2013-08-29 MED ORDER — MORPHINE SULFATE 4 MG/ML IJ SOLN
4.0000 mg | Freq: Once | INTRAMUSCULAR | Status: AC
  Administered 2013-08-29: 4 mg via INTRAVENOUS
  Filled 2013-08-29: qty 1

## 2013-08-29 NOTE — ED Provider Notes (Signed)
[name] 8:30 PM patient discussed and signout. Patient with chest pains low suspicion. Cardiac rule out in progress. Plan to repeat troponin if 2 troponins negative and patient appears well she may be discharged home to followup with PCP.  Laboratory testing including troponin x2, x-ray and EKG do not show any signs of concerning or emergent cause of pain. At this time patient may be discharged home to followup with PCP for continued evaluation.  Martie Lee, PA-C 08/29/13 2337

## 2013-08-29 NOTE — ED Notes (Signed)
Pt presents with Left chest pain radiating up into her Left neck with an episode last Friday that returned again today associated with nausea and has been constant with no relief. Pt denies another associated symptoms. Pt describes her pain as "an elephant on her chest."

## 2013-08-29 NOTE — ED Provider Notes (Signed)
CSN: [id number]     Arrival date & time 08/29/13  1832 History   First MD Initiated Contact with Patient 08/29/13 1928     Chief Complaint  Patient presents with  . Chest Pain    Patient is a 43 y.o. female presenting with chest pain.  Chest Pain  Pt had an episode of chest heaviness suddenly last week on Friday.  It lasted for a few hours.  She saw her doctor and had an EKG that was reassuring.  She was going to have further outpatient workup.   Today she started having left chest pain moving up to the neck.  The pain has been ongoing all day but worse this evening.  No shortness of breath.  Some nausea.  NO abdominal pain or vomiting.  No history of heart disease.  No smoking.  Father has history of CAD with stent at 62s. Past Medical History  Diagnosis Date  . Bipolar disorder   . Eczema   . Pes planus   . Achilles tendinitis   . Hypertension   . Migraine   . Achilles tendinitis   . Ganglion cyst 09/29/2009    left wrist (2 cyst)  . Arthritis   . Allergy   . Lipoma   . ADHD (attention deficit hyperactivity disorder)   . Hyperprolactinemia   . Bipolar affective   . Personality disorder    Past Surgical History  Procedure Laterality Date  . Tumor resection left thigh    . Ankle surgery  12/88    left   . Lipoma removal    . Ganglion cyst excision  2011  . Chest nodule  1990?    rt chest wall nodule removal  . Shoulder surgery  01/13/2011    right, partial tear  . Right bunioectomy     Family History  Problem Relation Age of Onset  . Hypertension Mother   . Hyperlipidemia Mother   . Heart attack Father   . Heart disease Father   . Hypertension Father   . Diabetes Paternal Grandfather   . Heart disease Maternal Aunt   . Breast cancer Maternal Aunt   . Heart disease Maternal Grandmother   . Cancer Maternal Grandmother     colon   History  Substance Use Topics  . Smoking status: Never Smoker   . Smokeless tobacco: Never Used  . Alcohol Use: No   OB  History   Grav Para Term Preterm Abortions TAB SAB Ect Mult Living                 Review of Systems  Cardiovascular: Positive for chest pain.  Genitourinary: Positive for hematuria.  All other systems reviewed and are negative.     Allergies  Adhesive; Dilaudid; Morphine; Penicillins; Percocet; Prednisone; Provera; and Ultram  Home Medications   Prior to Admission medications   Medication Sig Start Date End Date Taking? Authorizing Provider  amphetamine-dextroamphetamine (ADDERALL) 10 MG tablet Take 1 tablet (10 mg total) by mouth 2 (two) times daily at 8am and 2pm. For attention issues. 10/30/11   Mellissa Kohut, MD  divalproex (DEPAKOTE ER) 500 MG 24 hr tablet Take 2 tablets (1,000 mg total) by mouth at bedtime. For mood stabilization. 10/30/11   Milana Huntsman Readling, MD  Gabapentin, PHN, (GRALISE) 600 MG TABS Take 600 mg by mouth at bedtime.    Historical Provider, MD  hydrochlorothiazide (HYDRODIURIL) 25 MG tablet Take 1 tablet (25 mg total) by mouth daily. For blood pressure control.  10/30/11   Milana Huntsman Readling, MD  hydrOXYzine (VISTARIL) 25 MG capsule Take 25 mg by mouth as needed. 1-2 tablets 03/07/13   Historical Provider, MD  lamoTRIgine (LAMICTAL) 200 MG tablet Take 200 mg by mouth daily.    Historical Provider, MD  lurasidone (LATUDA) 80 MG TABS Take 1 tablet (80 mg total) by mouth daily at 6 PM. For psychosis and mood stabilization. 10/30/11   Milana Huntsman Readling, MD  metoprolol succinate (TOPROL-XL) 100 MG 24 hr tablet Take 1 tablet (100 mg total) by mouth daily. Take with or immediately following a meal for blood pressure control. 10/30/11   Mellissa Kohut, MD  Multiple Vitamin (MULTIVITAMIN WITH MINERALS) TABS Take 1 tablet by mouth daily. For nutritional supplementation. 10/30/11   Mellissa Kohut, MD  promethazine (PHENERGAN) 25 MG tablet Take 1 tablet (25 mg total) by mouth every 6 (six) hours as needed for nausea. 09/27/12   Marcial Pacas, MD  SUMAtriptan (IMITREX) 6 MG/0.5ML SOLN  injection Inject 0.5 mLs (6 mg total) into the skin as needed. 07/01/13   Ward Givens, NP  zolpidem (AMBIEN) 10 MG tablet Take 1 tablet (10 mg total) by mouth at bedtime. For sleep. 10/30/11 02/14/12  Milana Huntsman Readling, MD   BP 139/87  Pulse 67  Temp(Src) 98.3 F (36.8 C) (Oral)  Resp 18  SpO2 100%  LMP 08/29/2013 Physical Exam  Nursing note and vitals reviewed. Constitutional: She appears well-developed and well-nourished. No distress.  HENT:  Head: Normocephalic and atraumatic.  Right Ear: External ear normal.  Left Ear: External ear normal.  Eyes: Conjunctivae are normal. Right eye exhibits no discharge. Left eye exhibits no discharge. No scleral icterus.  Neck: Neck supple. No tracheal deviation present.  Cardiovascular: Normal rate, regular rhythm and intact distal pulses.   Pulmonary/Chest: Effort normal and breath sounds normal. No stridor. No respiratory distress. She has no wheezes. She has no rales.  Abdominal: Soft. Bowel sounds are normal. She exhibits no distension. There is no tenderness. There is no rebound and no guarding.  Musculoskeletal: She exhibits no edema and no tenderness.  Neurological: She is alert. She has normal strength. No cranial nerve deficit (no facial droop, extraocular movements intact, no slurred speech) or sensory deficit. She exhibits normal muscle tone. She displays no seizure activity. Coordination normal.  Skin: Skin is warm and dry. No rash noted.  Psychiatric: She has a normal mood and affect.    ED Course  Procedures (including critical care time) Labs Review Labs Reviewed  CBC  BASIC METABOLIC PANEL  APTT  [place], ED    Imaging Review No results found.   EKG Interpretation   Date/Time:  Friday August 29 2013 18:35:55 EDT Ventricular Rate:  71 PR Interval:  150 QRS Duration: 82 QT Interval:  396 QTC Calculation: 430 R Axis:   21 Text Interpretation:  Normal sinus rhythm with sinus arrhythmia Normal ECG   No significant change since last tracing Confirmed by Rocio Roam  MD-J, Jerzi Tigert  ([id number]) on 08/29/2013 7:11:39 PM      MDM   Pt is low risk for ACS.  TIMI zero.  EKG is reassuring.  Plan on labs, CXR, cardiac enzymes.  If 2 sets of enzymes are negative may be a good candidate for outpatient follow up.   Care will be turned over  To oncoming providers.  Dorie Rank, MD 08/29/13 2020

## 2013-08-29 NOTE — ED Notes (Signed)
Lab at bedside

## 2013-08-29 NOTE — Discharge Instructions (Signed)
Your testing today did not show any signs for a concerning for emergent cause of your chest pain. Please call your doctor on Monday for continued evaluation of your symptoms and close followup. Return any time for changing or worsening symptoms.    Chest Pain (Nonspecific) It is often hard to give a diagnosis for the cause of chest pain. There is always a chance that your pain could be related to something serious, such as a heart attack or a blood clot in the lungs. You need to follow up with your doctor. HOME CARE  If antibiotic medicine was given, take it as directed by your doctor. Finish the medicine even if you start to feel better.  For the next few days, avoid activities that bring on chest pain. Continue physical activities as told by your doctor.  Do not use any tobacco products. This includes cigarettes, chewing tobacco, and e-cigarettes.  Avoid drinking alcohol.  Only take medicine as told by your doctor.  Follow your doctor's suggestions for more testing if your chest pain does not go away.  Keep all doctor visits you made. GET HELP IF:  Your chest pain does not go away, even after treatment.  You have a rash with blisters on your chest.  You have a fever. GET HELP RIGHT AWAY IF:   You have more pain or pain that spreads to your arm, neck, jaw, back, or belly (abdomen).  You have shortness of breath.  You cough more than usual or cough up blood.  You have very bad back or belly pain.  You feel sick to your stomach (nauseous) or throw up (vomit).  You have very bad weakness.  You pass out (faint).  You have chills. This is an emergency. Do not wait to see if the problems will go away. Call your local emergency services (911 in [place] Do not drive yourself to the hospital. MAKE SURE YOU:   Understand these instructions.  Will watch your condition.  Will get help right away if you are not doing well or get worse. Document Released: 07/05/2007 Document  Revised: 01/21/2013 Document Reviewed: 07/05/2007 [facility] Patient Information 2015 [place], [place]. This information is not intended to replace advice given to you by your health care provider. Make sure you discuss any questions you have with your health care provider.

## 2013-08-30 NOTE — ED Provider Notes (Signed)
History/physical exam/procedure(s) were performed by non-physician practitioner and as supervising physician I was immediately available for consultation/collaboration. I have reviewed all notes and am in agreement with care and plan.   Shaune Pollack, MD 08/30/13 0040

## 2013-09-05 ENCOUNTER — Ambulatory Visit: Payer: Medicare HMO | Attending: Neurology | Admitting: Rehabilitative and Restorative Service Providers"

## 2013-09-05 DIAGNOSIS — R269 Unspecified abnormalities of gait and mobility: Secondary | ICD-10-CM | POA: Insufficient documentation

## 2013-09-05 DIAGNOSIS — R42 Dizziness and giddiness: Secondary | ICD-10-CM | POA: Insufficient documentation

## 2013-09-05 DIAGNOSIS — IMO0001 Reserved for inherently not codable concepts without codable children: Secondary | ICD-10-CM | POA: Insufficient documentation

## 2013-09-10 ENCOUNTER — Encounter: Payer: BC Managed Care – PPO | Admitting: Rehabilitative and Restorative Service Providers"

## 2013-09-11 ENCOUNTER — Emergency Department (HOSPITAL_COMMUNITY)
Admission: EM | Admit: 2013-09-11 | Discharge: 2013-09-11 | Disposition: A | Discharge status: Home / Self Care | Payer: Medicare HMO | Attending: Emergency Medicine | Admitting: Emergency Medicine

## 2013-09-11 ENCOUNTER — Encounter (HOSPITAL_COMMUNITY): Payer: Self-pay | Admitting: Emergency Medicine

## 2013-09-11 ENCOUNTER — Emergency Department (HOSPITAL_COMMUNITY): Payer: Medicare HMO

## 2013-09-11 DIAGNOSIS — R079 Chest pain, unspecified: Secondary | ICD-10-CM | POA: Insufficient documentation

## 2013-09-11 DIAGNOSIS — I10 Essential (primary) hypertension: Secondary | ICD-10-CM | POA: Insufficient documentation

## 2013-09-11 DIAGNOSIS — R0602 Shortness of breath: Secondary | ICD-10-CM | POA: Insufficient documentation

## 2013-09-11 DIAGNOSIS — M542 Cervicalgia: Secondary | ICD-10-CM | POA: Diagnosis not present

## 2013-09-11 DIAGNOSIS — F319 Bipolar disorder, unspecified: Secondary | ICD-10-CM | POA: Insufficient documentation

## 2013-09-11 DIAGNOSIS — G43909 Migraine, unspecified, not intractable, without status migrainosus: Secondary | ICD-10-CM | POA: Insufficient documentation

## 2013-09-11 DIAGNOSIS — Z85828 Personal history of other malignant neoplasm of skin: Secondary | ICD-10-CM | POA: Diagnosis not present

## 2013-09-11 DIAGNOSIS — Z9889 Other specified postprocedural states: Secondary | ICD-10-CM | POA: Insufficient documentation

## 2013-09-11 DIAGNOSIS — M129 Arthropathy, unspecified: Secondary | ICD-10-CM | POA: Diagnosis not present

## 2013-09-11 DIAGNOSIS — R110 Nausea: Secondary | ICD-10-CM | POA: Diagnosis not present

## 2013-09-11 DIAGNOSIS — M940 Chondrocostal junction syndrome [Tietze]: Secondary | ICD-10-CM | POA: Insufficient documentation

## 2013-09-11 DIAGNOSIS — Z3202 Encounter for pregnancy test, result negative: Secondary | ICD-10-CM | POA: Insufficient documentation

## 2013-09-11 DIAGNOSIS — Z880 Allergy status to penicillin: Secondary | ICD-10-CM | POA: Insufficient documentation

## 2013-09-11 DIAGNOSIS — R109 Unspecified abdominal pain: Secondary | ICD-10-CM | POA: Insufficient documentation

## 2013-09-11 DIAGNOSIS — Z79899 Other long term (current) drug therapy: Secondary | ICD-10-CM | POA: Diagnosis not present

## 2013-09-11 DIAGNOSIS — Z8739 Personal history of other diseases of the musculoskeletal system and connective tissue: Secondary | ICD-10-CM | POA: Diagnosis not present

## 2013-09-11 DIAGNOSIS — Z872 Personal history of diseases of the skin and subcutaneous tissue: Secondary | ICD-10-CM | POA: Diagnosis not present

## 2013-09-11 LAB — VALPROIC ACID LEVEL: Valproic Acid Lvl: 83.6 ug/mL (ref 50.0–100.0)

## 2013-09-11 LAB — BASIC METABOLIC PANEL
Anion gap: 14 (ref 5–15)
BUN: 8 mg/dL (ref 6–23)
CO2: 26 mEq/L (ref 19–32)
Calcium: 9.6 mg/dL (ref 8.4–10.5)
Chloride: 100 mEq/L (ref 96–112)
Creatinine, Ser: 0.93 mg/dL (ref 0.50–1.10)
GFR calc Af Amer: 86 mL/min — ABNORMAL LOW (ref >90)
GFR calc non Af Amer: 74 mL/min — ABNORMAL LOW (ref >90)
Glucose, Bld: 103 mg/dL — ABNORMAL HIGH (ref 70–99)
Potassium: 3.7 mEq/L (ref 3.7–5.3)
Sodium: 140 mEq/L (ref 137–147)

## 2013-09-11 LAB — URINALYSIS, ROUTINE W REFLEX MICROSCOPIC
Bilirubin Urine: NEGATIVE
Glucose, UA: NEGATIVE mg/dL
Ketones, ur: 15 mg/dL — AB
Nitrite: NEGATIVE
Protein, ur: 100 mg/dL — AB
Specific Gravity, Urine: 1.021 (ref 1.005–1.030)
Urobilinogen, UA: 1 mg/dL (ref 0.0–1.0)
pH: 8 (ref 5.0–8.0)

## 2013-09-11 LAB — CBC
HCT: 43.5 % (ref 36.0–46.0)
Hemoglobin: 14.5 g/dL (ref 12.0–15.0)
MCH: 30.5 pg (ref 26.0–34.0)
MCHC: 33.3 g/dL (ref 30.0–36.0)
MCV: 91.4 fL (ref 78.0–100.0)
Platelets: 221 10*3/uL (ref 150–400)
RBC: 4.76 MIL/uL (ref 3.87–5.11)
RDW: 12.5 % (ref 11.5–15.5)
WBC: 5.9 10*3/uL (ref 4.0–10.5)

## 2013-09-11 LAB — I-STAT TROPONIN, ED
Troponin i, poc: 0 ng/mL (ref 0.00–0.08)
Troponin i, poc: 0 ng/mL (ref 0.00–0.08)

## 2013-09-11 LAB — PREGNANCY, URINE: Preg Test, Ur: NEGATIVE

## 2013-09-11 LAB — URINE MICROSCOPIC-ADD ON

## 2013-09-11 LAB — D-DIMER, QUANTITATIVE: D-Dimer, Quant: <0.27 ug/mL-FEU (ref 0.00–0.48)

## 2013-09-11 MED ORDER — LORAZEPAM 2 MG/ML IJ SOLN
1.0000 mg | Freq: Once | INTRAMUSCULAR | Status: AC
  Administered 2013-09-11: 1 mg via INTRAVENOUS
  Filled 2013-09-11: qty 1

## 2013-09-11 MED ORDER — ONDANSETRON HCL 4 MG/2ML IJ SOLN
4.0000 mg | Freq: Once | INTRAMUSCULAR | Status: AC
  Administered 2013-09-11: 4 mg via INTRAVENOUS
  Filled 2013-09-11: qty 2

## 2013-09-11 MED ORDER — MORPHINE SULFATE 4 MG/ML IJ SOLN
8.0000 mg | Freq: Once | INTRAMUSCULAR | Status: AC
  Administered 2013-09-11: 8 mg via INTRAVENOUS
  Filled 2013-09-11: qty 2

## 2013-09-11 MED ORDER — MORPHINE SULFATE 4 MG/ML IJ SOLN
4.0000 mg | Freq: Once | INTRAMUSCULAR | Status: AC
  Administered 2013-09-11: 4 mg via INTRAVENOUS
  Filled 2013-09-11: qty 1

## 2013-09-11 MED ORDER — IBUPROFEN 800 MG PO TABS: 800.0000 mg | ORAL_TABLET | Freq: Three times a day (TID) | ORAL | Status: DC

## 2013-09-11 MED ORDER — CIPROFLOXACIN HCL 500 MG PO TABS: 500.0000 mg | ORAL_TABLET | Freq: Two times a day (BID) | ORAL | Status: AC

## 2013-09-11 MED ORDER — CIPROFLOXACIN HCL 500 MG PO TABS
500.0000 mg | ORAL_TABLET | Freq: Once | ORAL | Status: AC
  Administered 2013-09-11: 500 mg via ORAL
  Filled 2013-09-11: qty 1

## 2013-09-11 NOTE — ED Notes (Signed)
Pt reports pain medicine didn't help at all, it has made her pain worse and now the pain is moving all over.

## 2013-09-11 NOTE — ED Notes (Signed)
Sent urine culture down to lab as an add on

## 2013-09-11 NOTE — ED Notes (Signed)
Pt remains monitored by 5 lead, blood pressure, and pulse ox. Pts family remains at bedside.

## 2013-09-11 NOTE — ED Provider Notes (Signed)
CSN: [id number]     Arrival date & time 09/11/13  9242 History   First MD Initiated Contact with Patient 09/11/13 [phone]     Chief Complaint  Patient presents with  . Chest Pain     (Consider location/radiation/quality/duration/timing/severity/associated sxs/prior Treatment) Patient is a 43 y.o. female presenting with chest pain.  Chest Pain Associated symptoms: nausea and shortness of breath   Associated symptoms: no abdominal pain, no dizziness, no fever, no numbness, no palpitations, not vomiting and no weakness    [name] is a 43 year old female with a past medical history of hypertension who was brought in to the ER by EMS after experiencing a sudden onset of chest pain this morning which woke her from sleep around 0745. Patient describes the pain as a sharp pain 10 out of 10 which is intermittent as well as a dull, ache which is approximately 8/10. Patient states initially her pain radiated to her neck and left jaw and she experience some dyspnea with it. Patient describes her dyspnea as being "hard to take a deep breath". EMS transported patient and treated her for her chest pain with nitroglycerin and aspirin. Patient states the nitroglycerin did not affect her pain. She also complains of mild nausea. She denies dizziness, syncope, weakness, fever, chills, palpitations.   Past Medical History  Diagnosis Date  . Bipolar disorder   . Eczema   . Pes planus   . Achilles tendinitis   . Hypertension   . Migraine   . Achilles tendinitis   . Ganglion cyst 09/29/2009    left wrist (2 cyst)  . Arthritis   . Allergy   . Lipoma   . ADHD (attention deficit hyperactivity disorder)   . Hyperprolactinemia   . Bipolar affective   . Personality disorder    Past Surgical History  Procedure Laterality Date  . Tumor resection left thigh    . Ankle surgery  12/88    left   . Lipoma removal    . Ganglion cyst excision  2011  . Chest nodule  1990?    rt chest wall nodule removal  .  Shoulder surgery  01/13/2011    right, partial tear  . Right bunioectomy     Family History  Problem Relation Age of Onset  . Hypertension Mother   . Hyperlipidemia Mother   . Heart attack Father   . Heart disease Father   . Hypertension Father   . Diabetes Paternal Grandfather   . Heart disease Maternal Aunt   . Breast cancer Maternal Aunt   . Heart disease Maternal Grandmother   . Cancer Maternal Grandmother     colon   History  Substance Use Topics  . Smoking status: Never Smoker   . Smokeless tobacco: Never Used  . Alcohol Use: No   OB History   Grav Para Term Preterm Abortions TAB SAB Ect Mult Living                 Review of Systems  Constitutional: Negative for fever and chills.  Eyes: Negative for visual disturbance.  Respiratory: Positive for shortness of breath. Negative for chest tightness.   Cardiovascular: Positive for chest pain. Negative for palpitations and leg swelling.  Gastrointestinal: Positive for nausea. Negative for vomiting and abdominal pain.  Endocrine: Negative for polyuria.  Genitourinary: Negative for dysuria.  Musculoskeletal: Positive for neck pain. Negative for neck stiffness.  Skin: Negative for rash.  Allergic/Immunologic: Negative for immunocompromised state.  Neurological: Negative for dizziness, syncope,  weakness and numbness.  Psychiatric/Behavioral: Negative.       Allergies  Adhesive; Dilaudid; Morphine; Penicillins; Percocet; Prednisone; Provera; and Ultram  Home Medications   Prior to Admission medications   Medication Sig Start Date End Date Taking? Authorizing Provider  acetaminophen (TYLENOL) 500 MG tablet Take 1,000 mg by mouth every 8 (eight) hours as needed for headache.    Yes Historical Provider, MD  amphetamine-dextroamphetamine (ADDERALL) 10 MG tablet Take 1 tablet (10 mg total) by mouth 2 (two) times daily at 8am and 2pm. For attention issues. 10/30/11  Yes Milana Huntsman Readling, MD  Ascorbic Acid (VITAMIN C PO)  Take 1 tablet by mouth daily.   Yes Historical Provider, MD  aspirin 325 MG tablet Take 325 mg by mouth daily as needed for mild pain.   Yes Historical Provider, MD  clobetasol cream (TEMOVATE) 9.48 % Apply 1 application topically daily.   Yes Historical Provider, MD  Cyanocobalamin (VITAMIN B-12 PO) Take 1 tablet by mouth daily.   Yes Historical Provider, MD  divalproex (DEPAKOTE ER) 500 MG 24 hr tablet Take 2 tablets (1,000 mg total) by mouth at bedtime. For mood stabilization. 10/30/11  Yes Milana Huntsman Readling, MD  Gabapentin, PHN, (GRALISE) 600 MG TABS Take 600 mg by mouth at bedtime.   Yes Historical Provider, MD  hydrochlorothiazide (HYDRODIURIL) 25 MG tablet Take 1 tablet (25 mg total) by mouth daily. For blood pressure control. 10/30/11  Yes Milana Huntsman Readling, MD  hydrOXYzine (VISTARIL) 25 MG capsule Take 50 mg by mouth 2 (two) times daily as needed for anxiety (sleep).  03/07/13  Yes Historical Provider, MD  ibuprofen (ADVIL,MOTRIN) 200 MG tablet Take 400 mg by mouth every 6 (six) hours as needed for fever or moderate pain.   Yes Historical Provider, MD  lamoTRIgine (LAMICTAL) 200 MG tablet Take 200 mg by mouth at bedtime.    Yes Historical Provider, MD  lurasidone (LATUDA) 80 MG TABS Take 1 tablet (80 mg total) by mouth daily at 6 PM. For psychosis and mood stabilization. 10/30/11  Yes Milana Huntsman Readling, MD  metoprolol succinate (TOPROL-XL) 100 MG 24 hr tablet Take 1 tablet (100 mg total) by mouth daily. Take with or immediately following a meal for blood pressure control. 10/30/11  Yes Mellissa Kohut, MD  Multiple Vitamin (MULTIVITAMIN WITH MINERALS) TABS Take 1 tablet by mouth daily. For nutritional supplementation. 10/30/11  Yes Mellissa Kohut, MD  Multiple Vitamins-Minerals (ZINC PO) Take 1 tablet by mouth daily.   Yes Historical Provider, MD  naftifine (NAFTIN) 1 % cream Apply 1 application topically daily.   Yes Historical Provider, MD  OVER THE COUNTER MEDICATION Place 1 drop into both eyes  daily as needed. Optix eye drops   Yes Historical Provider, MD  promethazine (PHENERGAN) 25 MG tablet Take 1 tablet (25 mg total) by mouth every 6 (six) hours as needed for nausea. 09/27/12  Yes Marcial Pacas, MD  SUMAtriptan (IMITREX) 6 MG/0.5ML SOLN injection Inject 6 mg into the skin daily as needed for migraine. 07/01/13  Yes Ward Givens, NP  zolpidem (AMBIEN) 10 MG tablet Take 10 mg by mouth at bedtime. Patient states she takes it every night per patient   Yes Historical Provider, MD  bacitracin-polymyxin b (POLYSPORIN) ointment Apply 1 application topically daily as needed. For spider bite    Historical Provider, MD  ciprofloxacin (CIPRO) 500 MG tablet Take 1 tablet (500 mg total) by mouth 2 (two) times daily. One po bid x 7 days 09/11/13 09/15/13  Carrie Mew, PA-C  ibuprofen (ADVIL,MOTRIN) 800 MG tablet Take 1 tablet (800 mg total) by mouth 3 (three) times daily. 09/11/13   Carrie Mew, PA-C   BP 130/79  Pulse 68  Temp(Src) 98.5 F (36.9 C) (Oral)  Resp 21  Ht 5\' 9"  (1.753 m)  Wt 231 lb (104.781 kg)  BMI 34.10 kg/m2  SpO2 98%  LMP 09/07/2013 Physical Exam  Constitutional: She appears well-developed and well-nourished. No distress.  HENT:  Head: Normocephalic and atraumatic.  Eyes: Pupils are equal, round, and reactive to light. No scleral icterus.  Neck: Normal range of motion.  Cardiovascular: Normal rate, regular rhythm and normal heart sounds.   No murmur heard. Pulmonary/Chest: Effort normal and breath sounds normal. No respiratory distress.  Abdominal: Soft. Bowel sounds are normal. There is tenderness in the suprapubic area.  Mild tenderness noted suprapubic region.  Skin: She is not diaphoretic.    ED Course  Procedures (including critical care time) Labs Review Labs Reviewed  BASIC METABOLIC PANEL - Abnormal; Notable for the following:    Glucose, Bld 103 (*)    GFR calc non Af Amer 74 (*)    GFR calc Af Amer 86 (*)    All other components within normal limits   URINALYSIS, ROUTINE W REFLEX MICROSCOPIC - Abnormal; Notable for the following:    Color, Urine RED (*)    APPearance TURBID (*)    Hgb urine dipstick LARGE (*)    Ketones, ur 15 (*)    Protein, ur 100 (*)    Leukocytes, UA SMALL (*)    All other components within normal limits  URINE MICROSCOPIC-ADD ON - Abnormal; Notable for the following:    Squamous Epithelial / LPF FEW (*)    Bacteria, UA FEW (*)    All other components within normal limits  URINE CULTURE  CBC  VALPROIC ACID LEVEL  D-DIMER, QUANTITATIVE  PREGNANCY, URINE  I-STAT TROPOININ, ED  I-STAT TROPOININ, ED    Imaging Review Ct Abdomen Pelvis Wo Contrast  09/11/2013   CLINICAL DATA:  Renal stone.  Hematuria.  EXAM: CT ABDOMEN AND PELVIS WITHOUT CONTRAST  TECHNIQUE: Multidetector CT imaging of the abdomen and pelvis was performed following the standard protocol without IV contrast.  COMPARISON:  CT abdomen and pelvis 02/08/2010.  FINDINGS: There is dependent atelectasis in the lung bases. No pleural or pericardial effusion.  There are no renal or ureteral stones. No hydronephrosis on the right or left. The kidneys, ureters in urinary bladder all appear normal.  The gallbladder, liver, spleen, adrenal glands and pancreas are unremarkable. Uterus and adnexa appear normal. The stomach and small and large bowel appear normal. There is no lymphadenopathy or fluid. The appendix is not visualized and may have been removed. No evidence of inflammatory process is seen. No bony abnormality is seen.  IMPRESSION: Negative for urinary tract stone.  No acute finding.   Electronically Signed   By: Inge Rise M.D.   On: 09/11/2013 15:09   Dg Chest 2 View  09/11/2013   CLINICAL DATA:  Chest pain and hypertension  EXAM: CHEST  2 VIEW  COMPARISON:  August 29, 2013  FINDINGS: There is no edema or consolidation. Heart size and pulmonary vascularity are normal. No adenopathy. No pneumothorax. No bone lesions.  IMPRESSION: No edema or  consolidation.   Electronically Signed   By: Lowella Grip M.D.   On: 09/11/2013 10:24     EKG Interpretation   Date/Time:  Thursday September 11 2013 09:42:52 EDT Ventricular Rate:  77 PR Interval:  150 QRS Duration: 96 QT Interval:  387 QTC Calculation: 438 R Axis:   17 Text Interpretation:  Sinus rhythm Consider left atrial enlargement  Abnormal R-wave progression, early transition Borderline T abnormalities,  anterior leads No significant change since last tracing Confirmed by YAO   MD, DAVID ([id number]) on 09/11/2013 10:02:33 AM      MDM   Final diagnoses:  Costochondritis    43 year old female with past medical history of hypertension presenting with sudden onset of chest pain which woke her up from sleep. EKG sinus rhythm with no acute injury or ectopy, workup to include cardiac rule out, PE rule out with CBC, BMP, troponin, d-dimer, chest x-ray. On physical exam mild suprapubic tenderness is noted although patient did not complain of abdominal pain. She reported she experienced some vaginal bleeding this week even though her menses was approximately 2 weeks ago. She states she was seen by her OB yesterday and was given a pelvic exam yesterday  Upon interviewing patient again, she states her pain is better after morphine, however she noticed her pain has radiated into her left upper quadrant of her abdomen. She also states that she has experienced some mild dysuria over the past 2 days.  CT abdomen pelvis performed to rule out nephrolithiasis.   3:45 PM: After several doses of morphine patient states her pain is still at a 6/10. Due to the fact the patient has had normal labs, negative troponin, negative d-dimer, negative CT abdomen pelvis, negative chest x-ray we do not suspect patient's pain is of cardiac or pulmonary nature this time. UA returned with results as noted above. Since patient had some complaints of some mild dysuria for the past few days we'll treat her with by mouth  Cipro. Due to the fact that work up in the ER was negative for any clear cardiac or pulmonary etiology of patient's chest pain, and we advised her that her symptoms may be caused by a costochondritis. She states that she has had costochondritis in the past and she feels this also may be the same thing. Prescribed patient with Motrin to help with costochondritis. We encourage patient to follow up with her PCP regarding her pain. We also encourage patient to call or return to the ER should her symptoms persist, worsen or should she have any questions or concerns.    Filed Vitals:   09/11/13 1616  BP: 130/79  Pulse: 68  Temp:   Resp: 21     Signed,  Dahlia Bailiff, PA-C 4:48 PM  Patient seen and discussed with Dr. Shirlyn Goltz, M.D.   Carrie Mew, PA-C 09/11/13 [phone]

## 2013-09-11 NOTE — ED Notes (Signed)
Pt was asleep with she started having CP that woke her up, radiates to jaw. Pain never went away, 10/10 pain so she called ems. Pt c/o nausea and difficulty taking deep breath. BP 140/70 HR 70s RR 16. Now pain is 8/10. 99% on room air. 324 mg of ASA and1 Nitro, no change in pain. Pt had similar symptoms a few weeks ago and never dx with anything.

## 2013-09-11 NOTE — ED Notes (Signed)
Pt placed on monitor upon return to room from Ct. Pt continues to be monitored by 5 lead, blood  pressure, and pulse ox. Pts family remains at bedside.

## 2013-09-11 NOTE — ED Notes (Signed)
Pts IV removed pt getting dressed and awaiting discharge paperwork at bedside.

## 2013-09-11 NOTE — ED Notes (Signed)
Sent pregnancy urine down to lab as add on.

## 2013-09-11 NOTE — ED Notes (Signed)
Pt's mother at bedside.

## 2013-09-11 NOTE — ED Notes (Signed)
Pt continues to be monitored by 5 lead, blood pressure, and pulse ox.  

## 2013-09-11 NOTE — Discharge Instructions (Signed)
Urinary Tract Infection Urinary tract infections (UTIs) can develop anywhere along your urinary tract. Your urinary tract is your body's drainage system for removing wastes and extra water. Your urinary tract includes two kidneys, two ureters, a bladder, and a urethra. Your kidneys are a pair of bean-shaped organs. Each kidney is about the size of your fist. They are located below your ribs, one on each side of your spine. CAUSES Infections are caused by microbes, which are microscopic organisms, including fungi, viruses, and bacteria. These organisms are so small that they can only be seen through a microscope. Bacteria are the microbes that most commonly cause UTIs. SYMPTOMS  Symptoms of UTIs may vary by age and gender of the patient and by the location of the infection. Symptoms in young women typically include a frequent and intense urge to urinate and a painful, burning feeling in the bladder or urethra during urination. Older women and men are more likely to be tired, shaky, and weak and have muscle aches and abdominal pain. A fever may mean the infection is in your kidneys. Other symptoms of a kidney infection include pain in your back or sides below the ribs, nausea, and vomiting. DIAGNOSIS To diagnose a UTI, your caregiver will ask you about your symptoms. Your caregiver also will ask to provide a urine sample. The urine sample will be tested for bacteria and white blood cells. White blood cells are made by your body to help fight infection. TREATMENT  Typically, UTIs can be treated with medication. Because most UTIs are caused by a bacterial infection, they usually can be treated with the use of antibiotics. The choice of antibiotic and length of treatment depend on your symptoms and the type of bacteria causing your infection. HOME CARE INSTRUCTIONS  If you were prescribed antibiotics, take them exactly as your caregiver instructs you. Finish the medication even if you feel better after you  have only taken some of the medication.  Drink enough water and fluids to keep your urine clear or pale yellow.  Avoid caffeine, tea, and carbonated beverages. They tend to irritate your bladder.  Empty your bladder often. Avoid holding urine for long periods of time.  Empty your bladder before and after sexual intercourse.  After a bowel movement, women should cleanse from front to back. Use each tissue only once. SEEK MEDICAL CARE IF:   You have back pain.  You develop a fever.  Your symptoms do not begin to resolve within 3 days. SEEK IMMEDIATE MEDICAL CARE IF:   You have severe back pain or lower abdominal pain.  You develop chills.  You have nausea or vomiting.  You have continued burning or discomfort with urination. MAKE SURE YOU:   Understand these instructions.  Will watch your condition.  Will get help right away if you are not doing well or get worse. Document Released: 10/26/2004 Document Revised: 07/18/2011 Document Reviewed: 02/24/2011 [facility] Patient Information 2015 [place], [place]. This information is not intended to replace advice given to you by your health care provider. Make sure you discuss any questions you have with your health care provider.   Costochondritis Costochondritis, sometimes called Tietze syndrome, is a swelling and irritation (inflammation) of the tissue (cartilage) that connects your ribs with your breastbone (sternum). It causes pain in the chest and rib area. Costochondritis usually goes away on its own over time. It can take up to 6 weeks or longer to get better, especially if you are unable to limit your activities. CAUSES  Some cases of costochondritis have no known cause. Possible causes include:  Injury (trauma).  Exercise or activity such as lifting.  Severe coughing. SIGNS AND SYMPTOMS  Pain and tenderness in the chest and rib area.  Pain that gets worse when coughing or taking deep breaths.  Pain that gets worse  with specific movements. DIAGNOSIS  Your health care provider will do a physical exam and ask about your symptoms. Chest X-rays or other tests may be done to rule out other problems. TREATMENT  Costochondritis usually goes away on its own over time. Your health care provider may prescribe medicine to help relieve pain. HOME CARE INSTRUCTIONS   Avoid exhausting physical activity. Try not to strain your ribs during normal activity. This would include any activities using chest, abdominal, and side muscles, especially if heavy weights are used.  Apply ice to the affected area for the first 2 days after the pain begins.  Put ice in a plastic bag.  Place a towel between your skin and the bag.  Leave the ice on for 20 minutes, 2-3 times a day.  Only take over-the-counter or prescription medicines as directed by your health care provider. SEEK MEDICAL CARE IF:  You have redness or swelling at the rib joints. These are signs of infection.  Your pain does not go away despite rest or medicine. SEEK IMMEDIATE MEDICAL CARE IF:   Your pain increases or you are very uncomfortable.  You have shortness of breath or difficulty breathing.  You cough up blood.  You have worse chest pains, sweating, or vomiting.  You have a fever or persistent symptoms for more than 2-3 days.  You have a fever and your symptoms suddenly get worse. MAKE SURE YOU:   Understand these instructions.  Will watch your condition.  Will get help right away if you are not doing well or get worse. Document Released: 10/26/2004 Document Revised: 11/06/2012 Document Reviewed: 08/20/2012 [facility] Patient Information 2015 [place], [place]. This information is not intended to replace advice given to you by your health care provider. Make sure you discuss any questions you have with your health care provider.

## 2013-09-11 NOTE — ED Notes (Signed)
Pt continues to be monitored by 5 lead, blood pressure, and pulse ox. Pt was able to complete orthostatic vital signs with no assistance.

## 2013-09-12 LAB — URINE CULTURE: Colony Count: >=100000

## 2013-09-13 NOTE — ED Provider Notes (Signed)
Medical screening examination/treatment/procedure(s) were performed by non-physician practitioner and as supervising physician I was immediately available for consultation/collaboration.   EKG Interpretation   Date/Time:  Thursday September 11 2013 09:42:52 EDT Ventricular Rate:  77 PR Interval:  150 QRS Duration: 96 QT Interval:  387 QTC Calculation: 438 R Axis:   17 Text Interpretation:  Sinus rhythm Consider left atrial enlargement  Abnormal R-wave progression, early transition Borderline T abnormalities,  anterior leads No significant change since last tracing Confirmed by Harleen Fineberg   MD, Williams Dietrick ([id number]) on 09/11/2013 10:02:33 AM      [name] is a 43 y.o. female hx of HTN, anxiety here with chest pain, epigastric pain. Chest pain this morning, associated with some shortness of breath. Given nitro ans ASA. She told me that since she has been in the ED, she developed some left flank pain and abdominal pain as well. No vomiting. She had some vaginal bleeding yesterday and saw OB and had an unremarkable pelvic exam. On exam, patient appears anxious. ? Reproducible tenderness on sternal area. Minimal L CVAT and epigastric tenderness. I consider likely MSK exacerbated by anxiety. Also consider ACS (had neg delta troponin 2 weeks ago in the ED) vs PE. Abdominal pain consider stone vs gastritis. Trop neg x 2. D-dimer neg. I think chest pain likely from MSK and anxiety. UA + blood and UTI, CT showed no stone. Will d/c home with NSAIDS, abx for UTI and costochondritis.    Wandra Arthurs, MD 09/13/13 1059

## 2013-09-16 ENCOUNTER — Telehealth: Payer: Self-pay | Admitting: Adult Health

## 2013-09-16 NOTE — Telephone Encounter (Signed)
Patient stated she received a check from [facility] for DOS 01/08/13.  Spoke with Janett Billow and was informed that she didn't owe anything to provider.  Has concerns about what to do with Check.  Please call anytime and advise, can leave message on voice mail if not available.  Thanks

## 2013-09-16 NOTE — Telephone Encounter (Signed)
I have not spoken to this patient, as I do not have anything to do with billing.  Will forward message to billing dept to see if they can offer any guidance.  Please advise.  Thank you.

## 2013-09-24 ENCOUNTER — Encounter: Payer: BC Managed Care – PPO | Admitting: Rehabilitative and Restorative Service Providers"

## 2013-09-29 ENCOUNTER — Ambulatory Visit: Payer: Medicare HMO | Admitting: Rehabilitative and Restorative Service Providers"

## 2013-09-29 DIAGNOSIS — IMO0001 Reserved for inherently not codable concepts without codable children: Secondary | ICD-10-CM | POA: Diagnosis present

## 2013-09-29 DIAGNOSIS — R42 Dizziness and giddiness: Secondary | ICD-10-CM | POA: Diagnosis not present

## 2013-09-29 DIAGNOSIS — R269 Unspecified abnormalities of gait and mobility: Secondary | ICD-10-CM | POA: Diagnosis not present

## 2013-10-21 ENCOUNTER — Institutional Professional Consult (permissible substitution): Payer: BC Managed Care – PPO | Admitting: Interventional Cardiology

## 2013-12-31 ENCOUNTER — Ambulatory Visit (INDEPENDENT_AMBULATORY_CARE_PROVIDER_SITE_OTHER): Payer: Medicare HMO | Admitting: Adult Health

## 2013-12-31 ENCOUNTER — Encounter: Payer: Self-pay | Admitting: Adult Health

## 2013-12-31 VITALS — BP 125/82 | HR 79 | Temp 97.4°F | Ht 69.0 in | Wt 238.0 lb

## 2013-12-31 DIAGNOSIS — G43009 Migraine without aura, not intractable, without status migrainosus: Secondary | ICD-10-CM

## 2013-12-31 DIAGNOSIS — R42 Dizziness and giddiness: Secondary | ICD-10-CM

## 2013-12-31 MED ORDER — SUMATRIPTAN SUCCINATE 6 MG/0.5ML ~~LOC~~ SOLN: 6.0000 mg | Freq: Every day | SUBCUTANEOUS | Status: DC | PRN

## 2013-12-31 NOTE — Patient Instructions (Addendum)

## 2013-12-31 NOTE — Progress Notes (Signed)
PATIENT: Christeen Lai Bua DOB: January 21, 1971  REASON FOR VISIT: follow up HISTORY FROM: patient  HISTORY OF PRESENT ILLNESS:  Ms. Cerny is a 43 year old female with a history of migraines, gait instability and frequent falls. She returns today for follow-up. She is currently taking Depakote and Imitrex for migraines. She is also on Lamictal and latuda for mood stabilization. She has tried Botox injections in the past but they were unsuccessful. At the last visit she was sent for an MRI of the brain that was unremarkable. She also had blood work that was unremarkable. Patient states that she may have 1-2 migraines a week but lately they have not been so severe that she has had to go lay in a dark room. She uses the Imitrex as soon as the headache comes on and that works well for her.The patient went to neuro rehab for vestibular rehab and she reports that it worked well at first. She states that the dizziness has occurred since rehab not so much with positional changes. She states that in the last 2 weeks she has had about 4 episodes of dizziness. They normally last only a minute or two and sometimes as short as 30 seconds. She describes the dizziness as " dropping on an elevator." She feels slightly disoriented during the episodes. The dizziness does not always occur with her migraines. She went to the ED twice for chest pain- all test were negative. She has not seen a cardiologist. She states that she has been on [place] for years but has lately she has had two instances of sleep walking. She states that there was several things that was broken in her room but she does not recall this. She saw Dr. Caprice Beaver and she has cut the dose in half.   HISTORY 07/01/13: Ms. Handley is a 43 year old female with a history of migraines. She was initially referred here by [facility] student [facility]- Dr. Toney Rakes. She is currently on Depakote for migraine prophylaxis and Imitrex when needed. She also uses Phenergan for nausea.  She was getting Botox injections but reported that after the first round it stopped working. She is on Lamictal and latuda for mood stabilization. She takes Adderall for attention deficit. She reports that she has 12-15 headaches per month. Has photophobia and phonophobia with migraines. Does have nausea but no vomiting with migraines. The weather, chocolate and bright lights can bring on a migraine. Patient has a history of gait imbalance and dizziness. However she feels that her gait has gotten worse. She has had multiple falls and tends to run into things. She has not sustained any injuries from these falls. States that her mother has noticed that she runs into things frequently. No new medical issues since last seen  REVIEW OF SYSTEMS: Out of a complete 14 system review of symptoms, the patient complains only of the following symptoms, and all other reviewed systems are negative.  Activity change Light sensitivity Insomnia Sleep walking Aching muscles Neck pain Dizziness Headache  ALLERGIES: Allergies  Allergen Reactions  . Adhesive [Tape] Itching and Rash    Also reacted to Steri Strips and Band-Aids.  . Dilaudid [Hydromorphone Hcl] Itching  . Morphine Nausea And Vomiting  . Penicillins Hives  . Percocet [Oxycodone-Acetaminophen] Itching  . Prednisone Hives  . Provera [Medroxyprogesterone Acetate] Other (See Comments)    Causes manic episodes  . Ultram [Tramadol Hcl] Itching    HOME MEDICATIONS: Outpatient Prescriptions Prior to Visit  Medication Sig Dispense Refill  .  acetaminophen (TYLENOL) 500 MG tablet Take 1,000 mg by mouth every 8 (eight) hours as needed for headache.     . amphetamine-dextroamphetamine (ADDERALL) 10 MG tablet Take 1 tablet (10 mg total) by mouth 2 (two) times daily at 8am and 2pm. For attention issues. 60 tablet 0  . Ascorbic Acid (VITAMIN C PO) Take 1 tablet by mouth daily.    . bacitracin-polymyxin b (POLYSPORIN) ointment Apply 1 application topically  daily as needed. For spider bite    . Cyanocobalamin (VITAMIN B-12 PO) Take 1 tablet by mouth daily.    . divalproex (DEPAKOTE ER) 500 MG 24 hr tablet Take 2 tablets (1,000 mg total) by mouth at bedtime. For mood stabilization. 60 tablet 0  . Gabapentin, PHN, (GRALISE) 600 MG TABS Take 600 mg by mouth at bedtime.    . hydrochlorothiazide (HYDRODIURIL) 25 MG tablet Take 1 tablet (25 mg total) by mouth daily. For blood pressure control. 30 tablet 0  . hydrOXYzine (VISTARIL) 25 MG capsule Take 50 mg by mouth 2 (two) times daily as needed for anxiety (sleep).     Marland Kitchen ibuprofen (ADVIL,MOTRIN) 200 MG tablet Take 400 mg by mouth every 6 (six) hours as needed for fever or moderate pain.    Marland Kitchen lamoTRIgine (LAMICTAL) 200 MG tablet Take 200 mg by mouth at bedtime.     Marland Kitchen lurasidone (LATUDA) 80 MG TABS Take 1 tablet (80 mg total) by mouth daily at 6 PM. For psychosis and mood stabilization. 30 tablet 0  . metoprolol succinate (TOPROL-XL) 100 MG 24 hr tablet Take 1 tablet (100 mg total) by mouth daily. Take with or immediately following a meal for blood pressure control. 30 tablet 0  . Multiple Vitamin (MULTIVITAMIN WITH MINERALS) TABS Take 1 tablet by mouth daily. For nutritional supplementation. 30 tablet 0  . promethazine (PHENERGAN) 25 MG tablet Take 1 tablet (25 mg total) by mouth every 6 (six) hours as needed for nausea. 30 tablet 6  . SUMAtriptan (IMITREX) 6 MG/0.5ML SOLN injection Inject 6 mg into the skin daily as needed for migraine.    Marland Kitchen zolpidem (AMBIEN) 10 MG tablet Take 5 mg by mouth at bedtime. Patient states she takes it every night per patient    . aspirin 325 MG tablet Take 325 mg by mouth daily as needed for mild pain.    . clobetasol cream (TEMOVATE) 4.09 % Apply 1 application topically daily.    Marland Kitchen ibuprofen (ADVIL,MOTRIN) 800 MG tablet Take 1 tablet (800 mg total) by mouth 3 (three) times daily. (Patient not taking: Reported on 12/31/2013) 21 tablet 0  . Multiple Vitamins-Minerals (ZINC PO) Take  1 tablet by mouth daily.    . naftifine (NAFTIN) 1 % cream Apply 1 application topically daily.    Marland Kitchen OVER THE COUNTER MEDICATION Place 1 drop into both eyes daily as needed. Optix eye drops     Facility-Administered Medications Prior to Visit  Medication Dose Route Frequency Provider Last Rate Last Dose  . valproate (DEPACON) 1,000 mg in sodium chloride 0.9 % 100 mL IVPB  1,000 mg Intravenous Continuous Larey Seat, MD   Stopped at 09/27/12 1255    PAST MEDICAL HISTORY: Past Medical History  Diagnosis Date  . Bipolar disorder   . Eczema   . Pes planus   . Achilles tendinitis   . Hypertension   . Migraine   . Achilles tendinitis   . Ganglion cyst 09/29/2009    left wrist (2 cyst)  . Arthritis   . Allergy   .  Lipoma   . ADHD (attention deficit hyperactivity disorder)   . Hyperprolactinemia   . Bipolar affective   . Personality disorder     PAST SURGICAL HISTORY: Past Surgical History  Procedure Laterality Date  . Tumor resection left thigh    . Ankle surgery  12/88    left   . Lipoma removal    . Ganglion cyst excision  2011  . Chest nodule  1990?    rt chest wall nodule removal  . Shoulder surgery  01/13/2011    right, partial tear  . Right bunioectomy      FAMILY HISTORY: Family History  Problem Relation Age of Onset  . Hypertension Mother   . Hyperlipidemia Mother   . Heart attack Father   . Heart disease Father   . Hypertension Father   . Diabetes Paternal Grandfather   . Heart disease Maternal Aunt   . Breast cancer Maternal Aunt   . Heart disease Maternal Grandmother   . Cancer Maternal Grandmother     colon    SOCIAL HISTORY: History   Social History  . Marital Status: Single    Spouse Name: N/A    Number of Children: 0  . 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 Smoking status: Never Smoker   . Smokeless tobacco: Never Used  . Alcohol Use: No  . Drug Use: No  . Sexual Activity: Yes      Birth Control/ Protection: Pill   Other Topics Concern  . Not on file   Social History Narrative      PHYSICAL EXAM  Filed Vitals:   12/31/13 1016  BP: 125/82  Pulse: 79  Temp: 97.4 F (36.3 C)  TempSrc: Oral  Height: 5\' 9"  (1.753 m)  Weight: 238 lb (107.956 kg)   Body mass index is 35.13 kg/(m^2).  Generalized: Well developed, in no acute distress   Neurological examination  Mentation: Alert oriented to time, place, history taking. Follows all commands speech and language fluent Cranial nerve II-XII: Pupils were equal round reactive to light. Extraocular movements were full, visual field were full on confrontational test. Facial sensation and strength were normal. Uvula tongue midline. Head turning and shoulder shrug  were normal and symmetric. Motor: The motor testing reveals 5 over 5 strength of all 4 extremities. Good symmetric motor tone is noted throughout.  Sensory: Sensory testing is intact to soft touch on all 4 extremities. No evidence of extinction is noted.  Coordination: Cerebellar testing reveals good finger-nose-finger and heel-to-shin bilaterally.  Gait and station: Gait is normal. Tandem gait is normal. Romberg is negative. No drift is seen.  Reflexes: Deep tendon reflexes are symmetric and normal bilaterally.    DIAGNOSTIC DATA (LABS, IMAGING, TESTING) - I reviewed patient records, labs, notes, testing and imaging myself where available.  Lab Results  Component Value Date   WBC 5.9 09/11/2013   HGB 14.5 09/11/2013   HCT 43.5 09/11/2013   MCV 91.4 09/11/2013   PLT 221 09/11/2013      Component Value Date/Time   NA 140 09/11/2013 0949   K 3.7 09/11/2013 0949   CL 100 09/11/2013 0949   CO2 26 09/11/2013 0949   GLUCOSE 103* 09/11/2013 0949   BUN 8 09/11/2013 0949   CREATININE 0.93 09/11/2013 0949   CREATININE 0.83 09/11/2011 1524   CALCIUM 9.6 09/11/2013 0949   PROT 6.6 10/25/2011 1325   ALBUMIN 3.8 10/25/2011 1325  AST 16 10/25/2011 1325    ALT 10 10/25/2011 1325   ALKPHOS 66 10/25/2011 1325   BILITOT 0.3 10/25/2011 1325   GFRNONAA 74* 09/11/2013 0949   GFRAA 86* 09/11/2013 0949   Lab Results  Component Value Date   CHOL 164 10/02/2010   HDL 54 10/02/2010   LDLCALC 94 10/02/2010   TRIG 81 10/02/2010   CHOLHDL 3.0 10/02/2010   Lab Results  Component Value Date   HGBA1C 5.3 07/11/2007   No results found for: [id number] Lab Results  Component Value Date   TSH 1.725 10/27/2011      ASSESSMENT AND PLAN 44 y.o. year old female  has a past medical history of Bipolar disorder; Eczema; Pes planus; Achilles tendinitis; Hypertension; Migraine; Achilles tendinitis; Ganglion cyst (09/29/2009); Arthritis; Allergy; Lipoma; ADHD (attention deficit hyperactivity disorder); Hyperprolactinemia; Bipolar affective; and Personality disorder. here with:  1. Migraine 2. Dizziness  Patient's migraines have improved with Depakote and Imitrex. She has approximately 1-2 migraines a week but are relieved with Imitrex. I will refill the Imitrex today. She had blood work at the last visit that was unremarkable. The patient is complaining of dizziness. She describes it as feeling like she is "dropping on elevator." She had an MRI of the brain in the past that was unremarkable. I will order carotid Dopplers today. I have advised the patient to let her primary care episodes of dizziness as well considering her recent trips to the ED for chest pain. Patient verbalized understanding. She will follow up in 4 months with Dr. Brett Fairy.  Ward Givens, MSN, NP-C 12/31/2013, 10:38 AM [facility] [address] [place], [place] [address] [phone]  Note: This document was prepared with digital dictation and possible smart phrase technology. Any transcriptional errors that result from this process are unintentional.

## 2013-12-31 NOTE — Progress Notes (Signed)
I agree with the assessment and plan as directed by NP .The patient is known to me .   Hiren Peplinski, MD  

## 2014-02-04 ENCOUNTER — Ambulatory Visit (INDEPENDENT_AMBULATORY_CARE_PROVIDER_SITE_OTHER): Payer: PPO

## 2014-02-04 DIAGNOSIS — R42 Dizziness and giddiness: Secondary | ICD-10-CM

## 2014-02-09 ENCOUNTER — Telehealth: Payer: Self-pay | Admitting: Adult Health

## 2014-02-09 NOTE — Telephone Encounter (Signed)
I called the patient. Her carotid doppler study was normal. Patient verbalized understanding.

## 2014-02-12 ENCOUNTER — Encounter: Payer: Self-pay | Admitting: Family Medicine

## 2014-02-12 ENCOUNTER — Ambulatory Visit (INDEPENDENT_AMBULATORY_CARE_PROVIDER_SITE_OTHER): Payer: PPO | Admitting: Family Medicine

## 2014-02-12 VITALS — BP 128/84 | HR 71 | Ht 70.0 in | Wt 240.0 lb

## 2014-02-12 DIAGNOSIS — S4991XA Unspecified injury of right shoulder and upper arm, initial encounter: Secondary | ICD-10-CM

## 2014-02-12 DIAGNOSIS — S46811A Strain of other muscles, fascia and tendons at shoulder and upper arm level, right arm, initial encounter: Secondary | ICD-10-CM

## 2014-02-12 DIAGNOSIS — S56911A Strain of unspecified muscles, fascia and tendons at forearm level, right arm, initial encounter: Secondary | ICD-10-CM

## 2014-02-12 NOTE — Patient Instructions (Signed)
You have forearm and trapezius strains as well as a mild AC sprain of your right shoulder. Icing of the shoulder but heat for the neck/trapezius and forearm 15 minutes at a time 3-4 times a day. Ibuprofen 600 mg three times a day OR aleve 2 tabs twice a day with food for pain and inflammation. Start physical therapy. Do home exercises on days you don't go to therapy. Call me regarding the work note and where to fax this. Follow up with me in 4 weeks.

## 2014-02-13 ENCOUNTER — Encounter: Payer: Self-pay | Admitting: Family Medicine

## 2014-02-17 DIAGNOSIS — S4991XA Unspecified injury of right shoulder and upper arm, initial encounter: Secondary | ICD-10-CM | POA: Insufficient documentation

## 2014-02-17 NOTE — Assessment & Plan Note (Signed)
consistent with combination of trapezius and forearm strains and mild low grade AC sprain.  Reassured patient.  Icing of AC joint with heat for forearm and neck/trapezius.  NSAIDs regularly.  Start physical therapy for strains.  Follow-up in 4 weeks. Work note provided.

## 2014-02-17 NOTE — Progress Notes (Signed)
PCP: [name], MD  Subjective:   HPI: Patient is a 44 y.o. female here for fall.  Patient reports on 1/8 she had a nightmare and accidentally fell out of bed to the right. She landed onto right elbow and shoulder. Pain primarily in posterior shoulder/neck area, right forearm, and some right superior shoulder. Has been icing. Works at [employer] and has recently had hours cut back. Went to [employer] [facility] on Sunday and had negative x-rays of her shoulder. Is right handed. No numbness/tingling.  Past Medical History  Diagnosis Date  . Bipolar disorder   . Eczema   . Pes planus   . Achilles tendinitis   . Hypertension   . Migraine   . Achilles tendinitis   . Ganglion cyst 09/29/2009    left wrist (2 cyst)  . Arthritis   . Allergy   . Lipoma   . ADHD (attention deficit hyperactivity disorder)   . Hyperprolactinemia   . Bipolar affective   . Personality disorder     Current Outpatient Prescriptions on File Prior to Visit  Medication Sig Dispense Refill  . acetaminophen (TYLENOL) 500 MG tablet Take 1,000 mg by mouth every 8 (eight) hours as needed for headache.     . amphetamine-dextroamphetamine (ADDERALL) 10 MG tablet Take 1 tablet (10 mg total) by mouth 2 (two) times daily at 8am and 2pm. For attention issues. 60 tablet 0  . Ascorbic Acid (VITAMIN C PO) Take 1 tablet by mouth daily.    Marland Kitchen aspirin 325 MG tablet Take 325 mg by mouth daily as needed for mild pain.    . bacitracin-polymyxin b (POLYSPORIN) ointment Apply 1 application topically daily as needed. For spider bite    . clobetasol cream (TEMOVATE) 4.09 % Apply 1 application topically daily.    . Cyanocobalamin (VITAMIN B-12 PO) Take 1 tablet by mouth daily.    . divalproex (DEPAKOTE ER) 500 MG 24 hr tablet Take 2 tablets (1,000 mg total) by mouth at bedtime. For mood stabilization. 60 tablet 0  . Gabapentin, PHN, (GRALISE) 600 MG TABS Take 600 mg by mouth at bedtime.    . hydrochlorothiazide (HYDRODIURIL) 25 MG  tablet Take 1 tablet (25 mg total) by mouth daily. For blood pressure control. 30 tablet 0  . hydrOXYzine (VISTARIL) 25 MG capsule Take 50 mg by mouth 2 (two) times daily as needed for anxiety (sleep).     Marland Kitchen ibuprofen (ADVIL,MOTRIN) 200 MG tablet Take 400 mg by mouth every 6 (six) hours as needed for fever or moderate pain.    Marland Kitchen lamoTRIgine (LAMICTAL) 200 MG tablet Take 200 mg by mouth at bedtime.     Marland Kitchen lurasidone (LATUDA) 80 MG TABS Take 1 tablet (80 mg total) by mouth daily at 6 PM. For psychosis and mood stabilization. 30 tablet 0  . metoprolol succinate (TOPROL-XL) 100 MG 24 hr tablet Take 1 tablet (100 mg total) by mouth daily. Take with or immediately following a meal for blood pressure control. 30 tablet 0  . Multiple Vitamin (MULTIVITAMIN WITH MINERALS) TABS Take 1 tablet by mouth daily. For nutritional supplementation. 30 tablet 0  . Multiple Vitamins-Minerals (ZINC PO) Take 1 tablet by mouth daily.    . naftifine (NAFTIN) 1 % cream Apply 1 application topically daily.    Marland Kitchen OVER THE COUNTER MEDICATION Place 1 drop into both eyes daily as needed. Optix eye drops    . promethazine (PHENERGAN) 25 MG tablet Take 1 tablet (25 mg total) by mouth every 6 (six) hours  as needed for nausea. 30 tablet 6  . promethazine (PHENERGAN) 25 MG tablet Take 1 tablet (25 mg total) by mouth every 6 (six) hours as needed for nausea. 30 tablet 0  . SUMAtriptan (IMITREX) 6 MG/0.5ML SOLN injection Inject 0.5 mLs (6 mg total) into the skin daily as needed for migraine. 6 vial 3  . zolpidem (AMBIEN) 10 MG tablet Take 5 mg by mouth at bedtime. Patient states she takes it every night per patient     Current Facility-Administered Medications on File Prior to Visit  Medication Dose Route Frequency Provider Last Rate Last Dose  . valproate (DEPACON) 1,000 mg in sodium chloride 0.9 % 100 mL IVPB  1,000 mg Intravenous Continuous Larey Seat, MD   Stopped at 09/27/12 1255    Past Surgical History  Procedure Laterality  Date  . Tumor resection left thigh    . Ankle surgery  12/88    left   . Lipoma removal    . Ganglion cyst excision  2011  . Chest nodule  1990?    rt chest wall nodule removal  . Shoulder surgery  01/13/2011    right, partial tear  . Right bunioectomy      Allergies  Allergen Reactions  . Adhesive [Tape] Itching and Rash    Also reacted to Steri Strips and Band-Aids.  . Dilaudid [Hydromorphone Hcl] Itching  . Morphine Nausea And Vomiting  . Penicillins Hives  . Percocet [Oxycodone-Acetaminophen] Itching  . Prednisone Hives  . Provera [Medroxyprogesterone Acetate] Other (See Comments)    Causes manic episodes  . Ultram [Tramadol Hcl] Itching    History   Social History  . Marital Status: Single    Spouse Name: N/A    Number of Children: 0  . 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 Smoking status: Never Smoker   . Smokeless tobacco: Never Used  . Alcohol Use: No  . Drug Use: No  . Sexual Activity: Yes    Birth Control/ Protection: Pill   Other Topics Concern  . Not on file   Social History Narrative    Family History  Problem Relation Age of Onset  . Hypertension Mother   . Hyperlipidemia Mother   . Heart attack Father   . Heart disease Father   . Hypertension Father   . Diabetes Paternal Grandfather   . Heart disease Maternal Aunt   . Breast cancer Maternal Aunt   . Heart disease Maternal Grandmother   . Cancer Maternal Grandmother     colon    BP 128/84 mmHg  Pulse 71  Ht 5\' 10"  (1.778 m)  Wt 240 lb (108.863 kg)  BMI 34.44 kg/m2  Review of Systems: See HPI above.    Objective:  Physical Exam:  Gen: NAD  Neck: No gross deformity, swelling, bruising. TTP right trapezius, cervical paraspinal region.  No midline/bony TTP. FROM neck - pain with flexion and bilateral lateral rotations mildly. BUE strength 5/5.   Sensation intact to light touch.   NV intact distal BUEs.  R shoulder: No  swelling, ecchymoses.  No gross deformity. Mild TTP of AC joint. FROM with pain on abduction. Mild pain with Hawkins, Negative Neers. Strength 5/5 with empty can and resisted internal/external rotation. Negative apprehension. NV intact distally.  Right forearm: Tenderness mildly throughout forearm but no bony tenderness. No swelling, bruising, other deformity. Sensation intact to light touch.    Assessment & Plan:  1. Right arm/neck injury - consistent with combination of trapezius and forearm strains and mild low grade AC sprain.  Reassured patient.  Icing of AC joint with heat for forearm and neck/trapezius.  NSAIDs regularly.  Start physical therapy for strains.  Follow-up in 4 weeks. Work note provided.

## 2014-02-23 ENCOUNTER — Ambulatory Visit: Payer: PPO | Attending: Family Medicine | Admitting: Physical Therapy

## 2014-02-23 DIAGNOSIS — M25511 Pain in right shoulder: Secondary | ICD-10-CM | POA: Insufficient documentation

## 2014-02-23 DIAGNOSIS — M542 Cervicalgia: Secondary | ICD-10-CM | POA: Diagnosis not present

## 2014-02-23 DIAGNOSIS — M25521 Pain in right elbow: Secondary | ICD-10-CM | POA: Diagnosis not present

## 2014-02-23 DIAGNOSIS — S29012D Strain of muscle and tendon of back wall of thorax, subsequent encounter: Secondary | ICD-10-CM | POA: Diagnosis not present

## 2014-02-23 DIAGNOSIS — W06XXXD Fall from bed, subsequent encounter: Secondary | ICD-10-CM | POA: Diagnosis not present

## 2014-02-26 ENCOUNTER — Ambulatory Visit: Payer: PPO | Admitting: Physical Therapy

## 2014-02-26 DIAGNOSIS — S29012D Strain of muscle and tendon of back wall of thorax, subsequent encounter: Secondary | ICD-10-CM | POA: Diagnosis not present

## 2014-03-02 ENCOUNTER — Ambulatory Visit: Payer: PPO | Attending: Family Medicine | Admitting: Physical Therapy

## 2014-03-02 DIAGNOSIS — M25521 Pain in right elbow: Secondary | ICD-10-CM | POA: Insufficient documentation

## 2014-03-02 DIAGNOSIS — M542 Cervicalgia: Secondary | ICD-10-CM | POA: Diagnosis not present

## 2014-03-02 DIAGNOSIS — M25511 Pain in right shoulder: Secondary | ICD-10-CM | POA: Insufficient documentation

## 2014-03-05 ENCOUNTER — Ambulatory Visit: Payer: PPO | Admitting: Physical Therapy

## 2014-03-05 DIAGNOSIS — M25511 Pain in right shoulder: Secondary | ICD-10-CM | POA: Diagnosis not present

## 2014-03-09 ENCOUNTER — Ambulatory Visit: Payer: PPO | Admitting: Physical Therapy

## 2014-03-09 DIAGNOSIS — M25511 Pain in right shoulder: Secondary | ICD-10-CM | POA: Diagnosis not present

## 2014-03-12 ENCOUNTER — Ambulatory Visit: Payer: PPO | Admitting: Family Medicine

## 2014-03-13 ENCOUNTER — Ambulatory Visit: Payer: PPO | Admitting: Physical Therapy

## 2014-03-13 DIAGNOSIS — M25511 Pain in right shoulder: Secondary | ICD-10-CM | POA: Diagnosis not present

## 2014-03-16 ENCOUNTER — Ambulatory Visit: Payer: PPO | Admitting: Physical Therapy

## 2014-03-18 ENCOUNTER — Ambulatory Visit (INDEPENDENT_AMBULATORY_CARE_PROVIDER_SITE_OTHER): Payer: PPO | Admitting: Family Medicine

## 2014-03-18 ENCOUNTER — Encounter: Payer: Self-pay | Admitting: Family Medicine

## 2014-03-18 VITALS — BP 135/85 | HR 187 | Ht 70.0 in | Wt 237.0 lb

## 2014-03-18 DIAGNOSIS — S4991XD Unspecified injury of right shoulder and upper arm, subsequent encounter: Secondary | ICD-10-CM

## 2014-03-18 NOTE — Patient Instructions (Signed)
Continue with the physical therapy and home exercises for your shoulder. Active release is about the only other option if you're still not improving. For your hips it's important you do the strengthening exercises (side hip raises and standing hip rotations) every day for the next 6 weeks, 3 sets of 10 once a day. Stretches - pick 2-3, hold for 20-30 seconds, repeat 3 times each one once or twice a day. Follow up with me in 5-6 weeks.

## 2014-03-19 NOTE — Progress Notes (Signed)
PCP: Joyce Evener, MD  Subjective:   HPI: Patient is a 44 y.o. female here for fall.  1/14: Patient reports on 1/8 she had a nightmare and accidentally fell out of bed to the right. She landed onto right elbow and shoulder. Pain primarily in posterior shoulder/neck area, right forearm, and some right superior shoulder. Has been icing. Works at [employer] and has recently had hours cut back. Went to [facility] on Sunday and had negative x-rays of her shoulder. Is right handed. No numbness/tingling.  2/17: Patient reports her elbow feels much better with therapy. Right shoulder posteriorly still a problem - only slightly improved. Worse if she uses this too much. No new injuries. No radiation into arm. No numbness/tingling. Pain at top of shoulder improved. Also reports as an aside because of changes in her insurance, her insurance no longer covers her psychologist - we talked about this and will try to get her in with [facility] here at the [facility].  Past Medical History  Diagnosis Date  . Bipolar disorder   . Eczema   . Pes planus   . Achilles tendinitis   . Hypertension   . Migraine   . Achilles tendinitis   . Ganglion cyst 09/29/2009    left wrist (2 cyst)  . Arthritis   . Allergy   . Lipoma   . ADHD (attention deficit hyperactivity disorder)   . Hyperprolactinemia   . Bipolar affective   . Personality disorder     Current Outpatient Prescriptions on File Prior to Visit  Medication Sig Dispense Refill  . acetaminophen (TYLENOL) 500 MG tablet Take 1,000 mg by mouth every 8 (eight) hours as needed for headache.     . amphetamine-dextroamphetamine (ADDERALL) 10 MG tablet Take 1 tablet (10 mg total) by mouth 2 (two) times daily at 8am and 2pm. For attention issues. 60 tablet 0  . Ascorbic Acid (VITAMIN C PO) Take 1 tablet by mouth daily.    Marland Kitchen aspirin 325 MG tablet Take 325 mg by mouth daily as needed for mild pain.    . bacitracin-polymyxin b  (POLYSPORIN) ointment Apply 1 application topically daily as needed. For spider bite    . clobetasol cream (TEMOVATE) 3.01 % Apply 1 application topically daily.    . Cyanocobalamin (VITAMIN B-12 PO) Take 1 tablet by mouth daily.    . divalproex (DEPAKOTE ER) 500 MG 24 hr tablet Take 2 tablets (1,000 mg total) by mouth at bedtime. For mood stabilization. 60 tablet 0  . Gabapentin, PHN, (GRALISE) 600 MG TABS Take 600 mg by mouth at bedtime.    . hydrochlorothiazide (HYDRODIURIL) 25 MG tablet Take 1 tablet (25 mg total) by mouth daily. For blood pressure control. 30 tablet 0  . hydrOXYzine (VISTARIL) 25 MG capsule Take 50 mg by mouth 2 (two) times daily as needed for anxiety (sleep).     Marland Kitchen ibuprofen (ADVIL,MOTRIN) 200 MG tablet Take 400 mg by mouth every 6 (six) hours as needed for fever or moderate pain.    Marland Kitchen lamoTRIgine (LAMICTAL) 200 MG tablet Take 200 mg by mouth at bedtime.     Marland Kitchen lurasidone (LATUDA) 80 MG TABS Take 1 tablet (80 mg total) by mouth daily at 6 PM. For psychosis and mood stabilization. 30 tablet 0  . metoprolol succinate (TOPROL-XL) 100 MG 24 hr tablet Take 1 tablet (100 mg total) by mouth daily. Take with or immediately following a meal for blood pressure control. 30 tablet 0  . Multiple Vitamin (MULTIVITAMIN  WITH MINERALS) TABS Take 1 tablet by mouth daily. For nutritional supplementation. 30 tablet 0  . Multiple Vitamins-Minerals (ZINC PO) Take 1 tablet by mouth daily.    . naftifine (NAFTIN) 1 % cream Apply 1 application topically daily.    Marland Kitchen OVER THE COUNTER MEDICATION Place 1 drop into both eyes daily as needed. Optix eye drops    . promethazine (PHENERGAN) 25 MG tablet Take 1 tablet (25 mg total) by mouth every 6 (six) hours as needed for nausea. 30 tablet 6  . promethazine (PHENERGAN) 25 MG tablet Take 1 tablet (25 mg total) by mouth every 6 (six) hours as needed for nausea. 30 tablet 0  . SUMAtriptan (IMITREX) 6 MG/0.5ML SOLN injection Inject 0.5 mLs (6 mg total) into the  skin daily as needed for migraine. 6 vial 3  . zolpidem (AMBIEN) 10 MG tablet Take 5 mg by mouth at bedtime. Patient states she takes it every night per patient     Current Facility-Administered Medications on File Prior to Visit  Medication Dose Route Frequency Provider Last Rate Last Dose  . valproate (DEPACON) 1,000 mg in sodium chloride 0.9 % 100 mL IVPB  1,000 mg Intravenous Continuous Larey Seat, MD   Stopped at 09/27/12 1255    Past Surgical History  Procedure Laterality Date  . Tumor resection left thigh    . Ankle surgery  12/88    left   . Lipoma removal    . Ganglion cyst excision  2011  . Chest nodule  1990?    rt chest wall nodule removal  . Shoulder surgery  01/13/2011    right, partial tear  . Right bunioectomy      Allergies  Allergen Reactions  . Adhesive [Tape] Itching and Rash    Also reacted to Steri Strips and Band-Aids.  . Dilaudid [Hydromorphone Hcl] Itching  . Morphine Nausea And Vomiting  . Penicillins Hives  . Percocet [Oxycodone-Acetaminophen] Itching  . Prednisone Hives  . Provera [Medroxyprogesterone Acetate] Other (See Comments)    Causes manic episodes  . Ultram [Tramadol Hcl] Itching    History   Social History  . Marital Status: Single    Spouse Name: N/A  . Number of Children: 0  . 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 Smoking status: Never Smoker   . Smokeless tobacco: Never Used  . Alcohol Use: No  . Drug Use: No  . Sexual Activity: Yes    Birth Control/ Protection: Pill   Other Topics Concern  . Not on file   Social History Narrative    Family History  Problem Relation Age of Onset  . Hypertension Mother   . Hyperlipidemia Mother   . Heart attack Father   . Heart disease Father   . Hypertension Father   . Diabetes Paternal Grandfather   . Heart disease Maternal Aunt   . Breast cancer Maternal Aunt   . Heart disease Maternal Grandmother   . Cancer Maternal  Grandmother     colon    BP 135/85 mmHg  Pulse 187  Ht 5\' 10"  (1.778 m)  Wt 237 lb (107.502 kg)  BMI 34.01 kg/m2  Review of Systems: See HPI above.    Objective:  Physical Exam:  Gen: NAD  Neck: No gross deformity, swelling, bruising. TTP right trapezius, cervical paraspinal region.  No midline/bony TTP. FROM neck - pain with flexion and bilateral lateral rotations mildly but mainly  medial to right scapula. BUE strength 5/5.   Sensation intact to light touch.   NV intact distal BUEs.  R shoulder: No swelling, ecchymoses.  No gross deformity. No longer with TTP of AC joint. FROM with pain on abduction. Minimal pain with Hawkins, Negative Neers. Strength 5/5 with empty can and resisted internal/external rotation. Negative apprehension. NV intact distally.  Right forearm: No longer with tenderness of elbow, forearm. No swelling, bruising, other deformity. Sensation intact to light touch.    Assessment & Plan:  1. Right arm/neck injury - Primary issue now is trapezius spasm/strain.  AC and forearm issues improving.  Encouraged to continue with PT, home exercises.  Consider active release.  F/u in 5-6 weeks.  At end of visit she inquired about her hips and what the diagnosis was before - reviewed trochanteric bursitis, shown home exercises and stretches to do.  Consider repeating injections.

## 2014-03-19 NOTE — Assessment & Plan Note (Signed)
Primary issue now is trapezius spasm/strain.  AC and forearm issues improving.  Encouraged to continue with PT, home exercises.  Consider active release.  F/u in 5-6 weeks.

## 2014-03-20 ENCOUNTER — Ambulatory Visit: Payer: PPO | Admitting: Physical Therapy

## 2014-03-23 ENCOUNTER — Ambulatory Visit: Payer: PPO | Admitting: Physical Therapy

## 2014-03-25 ENCOUNTER — Encounter: Payer: Self-pay | Admitting: *Deleted

## 2014-03-25 ENCOUNTER — Ambulatory Visit (INDEPENDENT_AMBULATORY_CARE_PROVIDER_SITE_OTHER): Payer: PPO | Admitting: Psychology

## 2014-03-25 DIAGNOSIS — F3341 Major depressive disorder, recurrent, in partial remission: Secondary | ICD-10-CM

## 2014-03-26 ENCOUNTER — Encounter: Payer: Self-pay | Admitting: Physical Therapy

## 2014-03-26 ENCOUNTER — Ambulatory Visit: Payer: PPO | Admitting: Physical Therapy

## 2014-03-26 DIAGNOSIS — M25511 Pain in right shoulder: Secondary | ICD-10-CM | POA: Diagnosis not present

## 2014-03-26 NOTE — Therapy (Signed)
[address] [place], [place], [address] Phone: [phone]   Fax:  [phone]  Physical Therapy Treatment  Patient Details  Name: [name] MRN: [id number] Date of Birth: 31-Oct-1970 Referring Provider:  Dene Gentry, MD  Encounter Date: 03/26/2014      PT End of Session - 03/26/14 0836    Visit Number 7   Date for PT Re-Evaluation 04/01/14   PT Start Time 2119   PT Stop Time 0900   PT Time Calculation (min) 63 min   Activity Tolerance Patient tolerated treatment well      Past Medical History  Diagnosis Date  . Bipolar disorder   . Eczema   . Pes planus   . Achilles tendinitis   . Hypertension   . Migraine   . Achilles tendinitis   . Ganglion cyst 09/29/2009    left wrist (2 cyst)  . Arthritis   . Allergy   . Lipoma   . ADHD (attention deficit hyperactivity disorder)   . Hyperprolactinemia   . Bipolar affective   . Personality disorder     Past Surgical History  Procedure Laterality Date  . Tumor resection left thigh    . Ankle surgery  12/88    left   . Lipoma removal    . Ganglion cyst excision  2011  . Chest nodule  1990?    rt chest wall nodule removal  . Shoulder surgery  01/13/2011    right, partial tear  . Right bunioectomy      There were no vitals taken for this visit.  Visit Diagnosis:  Right shoulder pain      Subjective Assessment - 03/26/14 0802    Symptoms Right upper trap pain, reports that she is doing better and was able to reach up to the top shelf at work yesterday.   Limitations Lifting;House hold activities   Patient Stated Goals no pain with work activity   Currently in Pain? Yes   Pain Score 3    Pain Location Shoulder   Pain Orientation Right   Pain Descriptors / Indicators Sore   Pain Type Acute pain   Pain Onset 1 to 4 weeks ago   Pain Frequency Constant   Aggravating Factors  lifting and reaching   Pain Relieving Factors this  treatment   Effect of Pain on Daily Activities limits my work and reaching                    [facility] Adult PT Treatment/Exercise - 03/26/14 0001    Neck Exercises: Machines for Strengthening   UBE (Upper Arm Bike) level 5 x 4 minutes   Cybex Row 20# 2x15   Shoulder Exercises: ROM/Strengthening   "W" Arms 2# 2x10   X to V Arms 2# 2x10   Other ROM/Strengthening Exercises Lat pulls 20#   Other ROM/Strengthening Exercises 5# shrugs with upper trap and levator stretches   Shoulder Exercises: Isometric Strengthening   External Rotation Theraband   Theraband Level (External Rotation) Level 2 (Red)   Moist Heat Therapy   Number Minutes Moist Heat 15 Minutes   Moist Heat Location Shoulder   Electrical Stimulation   Electrical Stimulation Location right upper trap   Electrical Stimulation Parameters IFC   Electrical Stimulation Goals Pain   Manual Therapy   Manual Therapy Myofascial release   Myofascial Release to right upper trap, rhomboid and into the cervical area  PT Long Term Goals - 03/26/14 0840    PT LONG TERM GOAL #1   Title decrease pain 50%   Time 4   Period Weeks   Status New   PT LONG TERM GOAL #2   Title report 50% less difficulty at work   Time 4   Period Weeks   Status New               Plan - 03/26/14 1308    Clinical Impression Statement Patient has right upper trap pain.  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  Reports that overall her pain is better since starting PT   Pt will benefit from skilled therapeutic intervention in order to improve on the following deficits Pain;Increased muscle spasms;Decreased strength;Decreased range of motion   Rehab Potential Good   PT Frequency 2x / week   PT Duration 4 weeks   PT Treatment/Interventions Electrical Stimulation;Moist Heat;Therapeutic exercise;Patient/family education;Manual techniques   PT Next Visit Plan add exercises   Consulted and Agree with  Plan of Care Patient        Problem List Patient Active Problem List   Diagnosis Date Noted  . Injury of right shoulder and upper arm 02/17/2014  . Left leg pain 06/03/2013  . Right hip pain 06/03/2013  . Migraine with status migrainosus 01/08/2013  . Personality disorder   . Chronic migraine 05/08/2012  . Contact dermatitis 11/27/2011  . Major depressive disorder, recurrent episode 10/27/2011  . Generalized anxiety disorder 10/27/2011  . 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 Borderline personality disorder 10/27/2011  . Right foot pain 09/28/2011  . 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 Ganglion cyst 09/29/2009  . PES PLANUS 07/01/2008  . BIPOLAR DISORDER UNSPECIFIED 06/09/2008    Sumner Boast, PT 03/26/2014, 8:41 AM  [address] [place], [place], [address] Phone: [phone]   Fax:  [phone]

## 2014-04-01 ENCOUNTER — Ambulatory Visit (INDEPENDENT_AMBULATORY_CARE_PROVIDER_SITE_OTHER): Payer: PPO | Admitting: Psychology

## 2014-04-01 DIAGNOSIS — F3341 Major depressive disorder, recurrent, in partial remission: Secondary | ICD-10-CM

## 2014-04-02 ENCOUNTER — Ambulatory Visit: Payer: PPO | Admitting: Physical Therapy

## 2014-04-03 ENCOUNTER — Ambulatory Visit: Payer: PPO | Attending: Family Medicine | Admitting: Physical Therapy

## 2014-04-03 ENCOUNTER — Encounter: Payer: Self-pay | Admitting: Physical Therapy

## 2014-04-03 DIAGNOSIS — M25521 Pain in right elbow: Secondary | ICD-10-CM | POA: Diagnosis not present

## 2014-04-03 DIAGNOSIS — M542 Cervicalgia: Secondary | ICD-10-CM | POA: Insufficient documentation

## 2014-04-03 DIAGNOSIS — M25511 Pain in right shoulder: Secondary | ICD-10-CM | POA: Insufficient documentation

## 2014-04-03 NOTE — Therapy (Signed)
[place] [address] [place], [place], [address] Phone: [phone]   Fax:  [phone]  Physical Therapy Treatment  Patient Details  Name: [name] MRN: [id number] Date of Birth: May 24, 1970 Referring Provider:  Dene Gentry, MD  Encounter Date: 04/03/2014      PT End of Session - 04/03/14 0835    Visit Number 8   Date for PT Re-Evaluation 05/02/14   PT Start Time 5462   PT Stop Time 0900   PT Time Calculation (min) 63 min      Past Medical History  Diagnosis Date  . Bipolar disorder   . Eczema   . Pes planus   . Achilles tendinitis   . Hypertension   . Migraine   . Achilles tendinitis   . Ganglion cyst 09/29/2009    left wrist (2 cyst)  . Arthritis   . Allergy   . Lipoma   . ADHD (attention deficit hyperactivity disorder)   . Hyperprolactinemia   . Bipolar affective   . Personality disorder     Past Surgical History  Procedure Laterality Date  . Tumor resection left thigh    . Ankle surgery  12/88    left   . Lipoma removal    . Ganglion cyst excision  2011  . Chest nodule  1990?    rt chest wall nodule removal  . Shoulder surgery  01/13/2011    right, partial tear  . Right bunioectomy      There were no vitals taken for this visit.  Visit Diagnosis:  Right shoulder pain - Plan: PT plan of care cert/re-cert      Subjective Assessment - 04/03/14 0758    Symptoms REports  that she worked yesterday and had to do a lot of reaching, has some increased soreness and pain in the right shoulder area   Limitations Lifting;House hold activities   Patient Stated Goals no pain with work activity   Currently in Pain? Yes   Pain Score 4    Pain Location Shoulder   Pain Orientation Right   Pain Descriptors / Indicators Aching;Sore   Pain Onset 1 to 4 weeks ago   Pain Frequency Constant   Aggravating Factors  reports after work yesterday the pain was up to 8/10   Pain Relieving  Factors treatment helps                     [facility] Adult PT Treatment/Exercise - 04/03/14 0001    Neck Exercises: Machines for Strengthening   UBE (Upper Arm Bike) Level 5 x 5 minutes   Cybex Row 25# 2x15   Cybex Chest Press 5# 2x15   Power Tower lat pulls 20# 2x15   Shoulder Exercises: ROM/Strengthening   Wall Pushups 20 reps   "W" Arms 2# 2x10   X to V Arms 2# 2x10   Other ROM/Strengthening Exercises 5# shrugs with upper trap and levator stretches   Shoulder Exercises: Isometric Strengthening   External Rotation Theraband   Theraband Level (External Rotation) Level 2 (Red)   Moist Heat Therapy   Number Minutes Moist Heat 15 Minutes   Moist Heat Location Shoulder   Electrical Stimulation   Electrical Stimulation Location right upper trap   Electrical Stimulation Parameters IFC   Electrical Stimulation Goals Pain   Manual Therapy   Manual Therapy Myofascial release   Myofascial Release to right upper trap, rhomboid and into the cervical area  PT Long Term Goals - 04/03/14 0837    PT LONG TERM GOAL #1   Status Partially Met   PT LONG TERM GOAL #2   Status Partially Met               Plan - 04/03/14 0835    Clinical Impression Statement Doing much better overall with activity, but some work activity does increase pain and tension in the upper trap   Pt will benefit from skilled therapeutic intervention in order to improve on the following deficits Pain;Increased muscle spasms;Decreased strength;Decreased range of motion   Rehab Potential Good   PT Frequency 2x / week   PT Duration 4 weeks   PT Treatment/Interventions Electrical Stimulation;Moist Heat;Therapeutic exercise;Patient/family education;Manual techniques   PT Next Visit Plan add exercises   Consulted and Agree with Plan of Care Patient        Problem List Patient Active Problem List   Diagnosis Date Noted  . Injury of right shoulder and upper arm  02/17/2014  . Left leg pain 06/03/2013  . Right hip pain 06/03/2013  . Migraine with status migrainosus 01/08/2013  . Personality disorder   . Chronic migraine 05/08/2012  . Contact dermatitis 11/27/2011  . Major depressive disorder, recurrent episode 10/27/2011  . Generalized anxiety disorder 10/27/2011  . 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 Borderline personality disorder 10/27/2011  . Right foot pain 09/28/2011  . 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 Ganglion cyst 09/29/2009  . PES PLANUS 07/01/2008  . BIPOLAR DISORDER UNSPECIFIED 06/09/2008    Sumner Boast, PT 04/03/2014, 8:39 AM  [address] [place], [place], [address] Phone: [phone]   Fax:  [phone]

## 2014-04-07 ENCOUNTER — Encounter: Payer: Self-pay | Admitting: Physical Therapy

## 2014-04-07 ENCOUNTER — Ambulatory Visit: Payer: PPO | Admitting: Physical Therapy

## 2014-04-07 DIAGNOSIS — M25511 Pain in right shoulder: Secondary | ICD-10-CM

## 2014-04-07 NOTE — Therapy (Signed)
[place] [address] [place], [place], [address] Phone: [phone]   Fax:  [phone]  Physical Therapy Treatment  Patient Details  Name: [name] MRN: [id number] Date of Birth: [birth year] Referring Provider:  Dene Gentry, MD  Encounter Date: 04/07/2014      PT End of Session - 04/07/14 0840    Visit Number 9   Date for PT Re-Evaluation 05/02/14   PT Start Time 9735   PT Stop Time 3299   PT Time Calculation (min) 62 min   Activity Tolerance Patient tolerated treatment well      Past Medical History  Diagnosis Date  . Bipolar disorder   . Eczema   . Pes planus   . Achilles tendinitis   . Hypertension   . Migraine   . Achilles tendinitis   . Ganglion cyst 09/29/2009    left wrist (2 cyst)  . Arthritis   . Allergy   . Lipoma   . ADHD (attention deficit hyperactivity disorder)   . Hyperprolactinemia   . Bipolar affective   . Personality disorder     Past Surgical History  Procedure Laterality Date  . Tumor resection left thigh    . Ankle surgery  12/88    left   . Lipoma removal    . Ganglion cyst excision  2011  . Chest nodule  1990?    rt chest wall nodule removal  . Shoulder surgery  01/13/2011    right, partial tear  . Right bunioectomy      There were no vitals taken for this visit.  Visit Diagnosis:  Right shoulder pain      Subjective Assessment - 04/07/14 0755    Symptoms Still doing pretty good.  Less pain and better motions but works will still cause pain and mm tightness   Limitations Lifting;House hold activities   Patient Stated Goals no pain with work activity   Currently in Pain? Yes   Pain Score 3    Pain Location Shoulder   Pain Orientation Right   Pain Descriptors / Indicators Aching;Cramping;Tightness   Pain Type Acute pain   Pain Onset 1 to 4 weeks ago   Pain Frequency Intermittent   Aggravating Factors  worse with work or holding arms up above  shoulder level   Pain Relieving Factors better with treatment                    [facility] Adult PT Treatment/Exercise - 04/07/14 0001    Neck Exercises: Machines for Strengthening   UBE (Upper Arm Bike) Level 5 x 5 minutes   Cybex Row 25# 2x15   Cybex Chest Press 5# 2x15   Power Tower lat pulls 20# 2x15   Shoulder Exercises: ROM/Strengthening   Wall Pushups 20 reps   Pushups 15 reps  seratus push   "W" Arms 2# 2x10   X to V Arms 2# 2x10   Rhythmic Stabilization, Seated with weighted ball   Other ROM/Strengthening Exercises 5# shrugs with upper trap and levator stretches   Shoulder Exercises: Isometric Strengthening   External Rotation Theraband   Theraband Level (External Rotation) Level 2 (Red)   Shoulder Exercises: Power Futures trader scap stabilization with 5#   Acupuncturist Location right upper trap   Electrical Stimulation Parameters ifc   Electrical Stimulation Goals Pain   Manual Therapy   Manual Therapy Myofascial release  Myofascial Release to right upper trap, rhomboid and into the cervical area                     PT Long Term Goals - 04/07/14 0842    PT LONG TERM GOAL #1   Title decrease pain 50%   Status Partially Met   PT LONG TERM GOAL #2   Title report 50% less difficulty at work   Status Partially Met               Plan - 04/07/14 0841    Clinical Impression Statement I am feeling better, still tight and cramping in the right shoulder blade area, doing better at work but still some things really get me   Pt will benefit from skilled therapeutic intervention in order to improve on the following deficits Pain;Increased muscle spasms;Decreased strength;Decreased range of motion   Rehab Potential Good   PT Frequency 2x / week   PT Duration 4 weeks   PT Treatment/Interventions Electrical Stimulation;Moist Heat;Therapeutic exercise;Patient/family education;Manual  techniques   PT Next Visit Plan continue to work on scapular stabilization   Consulted and Agree with Plan of Care Patient        Problem List Patient Active Problem List   Diagnosis Date Noted  . Injury of right shoulder and upper arm 02/17/2014  . Left leg pain 06/03/2013  . Right hip pain 06/03/2013  . Migraine with status migrainosus 01/08/2013  . Personality disorder   . Chronic migraine 05/08/2012  . Contact dermatitis 11/27/2011  . Major depressive disorder, recurrent episode 10/27/2011  . Generalized anxiety disorder 10/27/2011  . 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 Borderline personality disorder 10/27/2011  . Right foot pain 09/28/2011  . 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 Ganglion cyst 09/29/2009  . PES PLANUS 07/01/2008  . BIPOLAR DISORDER UNSPECIFIED 06/09/2008    Sumner Boast, PT 04/07/2014, 8:43 AM  [address] [place], [place], [address] Phone: [phone]   Fax:  [phone]

## 2014-04-08 ENCOUNTER — Ambulatory Visit (INDEPENDENT_AMBULATORY_CARE_PROVIDER_SITE_OTHER): Payer: PPO | Admitting: Psychology

## 2014-04-08 DIAGNOSIS — F3341 Major depressive disorder, recurrent, in partial remission: Secondary | ICD-10-CM | POA: Diagnosis not present

## 2014-04-10 ENCOUNTER — Encounter: Payer: Self-pay | Admitting: Physical Therapy

## 2014-04-10 ENCOUNTER — Ambulatory Visit: Payer: PPO | Admitting: Physical Therapy

## 2014-04-10 DIAGNOSIS — M25511 Pain in right shoulder: Secondary | ICD-10-CM

## 2014-04-10 NOTE — Therapy (Signed)
[place] [address] [place], [place], [address] Phone: [phone]   Fax:  [phone]  Physical Therapy Treatment  Patient Details  Name: [name] MRN: [id number] Date of Birth: 1970-06-08 Referring Provider:  Dene Gentry, MD  Encounter Date: 04/10/2014      PT End of Session - 04/10/14 0842    Visit Number 10   Date for PT Re-Evaluation 05/02/14   PT Start Time 0750   PT Stop Time 0900   PT Time Calculation (min) 70 min      Past Medical History  Diagnosis Date  . Bipolar disorder   . Eczema   . Pes planus   . Achilles tendinitis   . Hypertension   . Migraine   . Achilles tendinitis   . Ganglion cyst 09/29/2009    left wrist (2 cyst)  . Arthritis   . Allergy   . Lipoma   . ADHD (attention deficit hyperactivity disorder)   . Hyperprolactinemia   . Bipolar affective   . Personality disorder     Past Surgical History  Procedure Laterality Date  . Tumor resection left thigh    . Ankle surgery  12/88    left   . Lipoma removal    . Ganglion cyst excision  2011  . Chest nodule  1990?    rt chest wall nodule removal  . Shoulder surgery  01/13/2011    right, partial tear  . Right bunioectomy      There were no vitals filed for this visit.  Visit Diagnosis:  Right shoulder pain      Subjective Assessment - 04/10/14 0838    Symptoms Overall the shoulder really is getting better.  Still some pain with reaching high shelf   Limitations Lifting;House hold activities   Patient Stated Goals no pain with work activity   Currently in Pain? Yes   Pain Score 2    Pain Location Shoulder   Pain Orientation Right   Pain Descriptors / Indicators Aching   Pain Onset 1 to 4 weeks ago   Pain Frequency Intermittent   Aggravating Factors  reaching a high shelf   Pain Relieving Factors treatment and exercises seem to help   Effect of Pain on Daily Activities limits work                        [facility] Adult PT Treatment/Exercise - 04/10/14 0001    Neck Exercises: Machines for Strengthening   UBE (Upper Arm Bike) Level 5 x 5 minutes   Cybex Row 25# 2x15   Cybex Chest Press 5# 2x15   Power Tower lat pulls 20# 2x15   Other Machines for Strengthening Body blade all motions 20 seconds 2 sets each   Shoulder Exercises: ROM/Strengthening   Wall Pushups 20 reps   "W" Arms 3#   X to V Arms 3#   Rhythmic Stabilization, Seated with weighted ball   Other ROM/Strengthening Exercises 5# shrugs with upper trap and levator stretches   Shoulder Exercises: Isometric Strengthening   External Rotation Theraband   Theraband Level (External Rotation) Level 2 (Red)   Shoulder Exercises: Power Futures trader scap stabilization with 5#   Moist Heat Therapy   Number Minutes Moist Heat 15 Minutes   Moist Heat Location Shoulder   Electrical Stimulation   Electrical Stimulation Location right upper trap   Electrical Stimulation Parameters IFC  Electrical Stimulation Goals Pain   Manual Therapy   Manual Therapy Myofascial release   Myofascial Release to right upper trap, rhomboid and into the cervical area                     PT Long Term Goals - 2014/04/25 0845    PT LONG TERM GOAL #1   Title decrease pain 50%   Status Partially Met   PT LONG TERM GOAL #2   Title report 50% less difficulty at work   Status Partially Met               Plan - 2014/04/25 0845    Clinical Impression Statement Doing better.  Less problems at work and better housecleaning at home, still hurts iwth activity   Pt will benefit from skilled therapeutic intervention in order to improve on the following deficits Pain;Increased muscle spasms;Decreased strength;Decreased range of motion   Rehab Potential Good   PT Frequency 2x / week   PT Duration 3 weeks   PT Treatment/Interventions Electrical Stimulation;Moist Heat;Therapeutic  exercise;Patient/family education;Manual techniques   PT Next Visit Plan continue to work on scapular stabilization   Consulted and Agree with Plan of Care Patient          G-Codes - April 25, 2014 0846    Functional Assessment Tool Used FOTO   Functional Limitation Other PT primary   Other PT Primary Current Status (Q7619) At least 40 percent but less than 60 percent impaired, limited or restricted   Other PT Primary Goal Status (J0932) At least 20 percent but less than 40 percent impaired, limited or restricted      Problem List Patient Active Problem List   Diagnosis Date Noted  . Injury of right shoulder and upper arm 02/17/2014  . Left leg pain 06/03/2013  . Right hip pain 06/03/2013  . Migraine with status migrainosus 01/08/2013  . Personality disorder   . Chronic migraine 05/08/2012  . Contact dermatitis 11/27/2011  . Major depressive disorder, recurrent episode 10/27/2011  . Generalized anxiety disorder 10/27/2011  . 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 Borderline personality disorder 10/27/2011  . Right foot pain 09/28/2011  . 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 Ganglion cyst 09/29/2009  . PES PLANUS 07/01/2008  . BIPOLAR DISORDER UNSPECIFIED 06/09/2008    Sumner Boast, PT 04/25/14, 8:52 AM  [address] [place], [place], [address] Phone: [phone]   Fax:  [phone]

## 2014-04-12 ENCOUNTER — Encounter (HOSPITAL_COMMUNITY): Payer: Self-pay

## 2014-04-12 ENCOUNTER — Emergency Department (HOSPITAL_COMMUNITY)
Admission: EM | Admit: 2014-04-12 | Discharge: 2014-04-12 | Disposition: A | Discharge status: Other Acute Inpatient Hospital | Payer: PPO | Source: Home / Self Care | Attending: Emergency Medicine | Admitting: Emergency Medicine

## 2014-04-12 ENCOUNTER — Ambulatory Visit (HOSPITAL_COMMUNITY)
Admission: RE | Admit: 2014-04-12 | Discharge: 2014-04-12 | Disposition: A | Discharge status: Home / Self Care | Payer: PPO | Source: Home / Self Care | Attending: Psychiatry | Admitting: Psychiatry

## 2014-04-12 ENCOUNTER — Encounter (HOSPITAL_COMMUNITY): Payer: Self-pay | Admitting: *Deleted

## 2014-04-12 ENCOUNTER — Inpatient Hospital Stay (HOSPITAL_COMMUNITY)
Admission: AD | Admit: 2014-04-12 | Discharge: 2014-04-24 | DRG: 885 | Disposition: A | Discharge status: Home / Self Care | Payer: PPO | Source: Intra-hospital | Attending: Psychiatry | Admitting: Psychiatry

## 2014-04-12 DIAGNOSIS — F314 Bipolar disorder, current episode depressed, severe, without psychotic features: Secondary | ICD-10-CM

## 2014-04-12 DIAGNOSIS — Z880 Allergy status to penicillin: Secondary | ICD-10-CM

## 2014-04-12 DIAGNOSIS — Z872 Personal history of diseases of the skin and subcutaneous tissue: Secondary | ICD-10-CM

## 2014-04-12 DIAGNOSIS — F3163 Bipolar disorder, current episode mixed, severe, without psychotic features: Principal | ICD-10-CM | POA: Diagnosis present

## 2014-04-12 DIAGNOSIS — Z8639 Personal history of other endocrine, nutritional and metabolic disease: Secondary | ICD-10-CM

## 2014-04-12 DIAGNOSIS — R45851 Suicidal ideations: Secondary | ICD-10-CM

## 2014-04-12 DIAGNOSIS — F3131 Bipolar disorder, current episode depressed, mild: Secondary | ICD-10-CM | POA: Diagnosis present

## 2014-04-12 DIAGNOSIS — F909 Attention-deficit hyperactivity disorder, unspecified type: Secondary | ICD-10-CM | POA: Insufficient documentation

## 2014-04-12 DIAGNOSIS — Z79899 Other long term (current) drug therapy: Secondary | ICD-10-CM | POA: Insufficient documentation

## 2014-04-12 DIAGNOSIS — Z8572 Personal history of non-Hodgkin lymphomas: Secondary | ICD-10-CM | POA: Insufficient documentation

## 2014-04-12 DIAGNOSIS — F329 Major depressive disorder, single episode, unspecified: Secondary | ICD-10-CM | POA: Diagnosis not present

## 2014-04-12 DIAGNOSIS — G43909 Migraine, unspecified, not intractable, without status migrainosus: Secondary | ICD-10-CM

## 2014-04-12 DIAGNOSIS — F1510 Other stimulant abuse, uncomplicated: Secondary | ICD-10-CM

## 2014-04-12 DIAGNOSIS — M1990 Unspecified osteoarthritis, unspecified site: Secondary | ICD-10-CM

## 2014-04-12 DIAGNOSIS — I10 Essential (primary) hypertension: Secondary | ICD-10-CM | POA: Diagnosis present

## 2014-04-12 DIAGNOSIS — F3160 Bipolar disorder, current episode mixed, unspecified: Secondary | ICD-10-CM | POA: Diagnosis present

## 2014-04-12 LAB — COMPREHENSIVE METABOLIC PANEL
ALT: 13 U/L (ref 0–35)
AST: 21 U/L (ref 0–37)
Albumin: 4.1 g/dL (ref 3.5–5.2)
Alkaline Phosphatase: 63 U/L (ref 39–117)
Anion gap: 8 (ref 5–15)
BUN: 10 mg/dL (ref 6–23)
CO2: 27 mmol/L (ref 19–32)
Calcium: 9.5 mg/dL (ref 8.4–10.5)
Chloride: 104 mmol/L (ref 96–112)
Creatinine, Ser: 0.89 mg/dL (ref 0.50–1.10)
GFR calc Af Amer: >90 mL/min (ref >90)
GFR calc non Af Amer: 78 mL/min — ABNORMAL LOW (ref >90)
Glucose, Bld: 83 mg/dL (ref 70–99)
Potassium: 3.7 mmol/L (ref 3.5–5.1)
Sodium: 139 mmol/L (ref 135–145)
Total Bilirubin: 0.6 mg/dL (ref 0.3–1.2)
Total Protein: 6.6 g/dL (ref 6.0–8.3)

## 2014-04-12 LAB — RAPID URINE DRUG SCREEN, HOSP PERFORMED
Amphetamines: POSITIVE — AB
Barbiturates: NOT DETECTED
Benzodiazepines: NOT DETECTED
Cocaine: NOT DETECTED
Opiates: NOT DETECTED
Tetrahydrocannabinol: NOT DETECTED

## 2014-04-12 LAB — CBC WITH DIFFERENTIAL/PLATELET
Basophils Absolute: 0 10*3/uL (ref 0.0–0.1)
Basophils Relative: 1 % (ref 0–1)
Eosinophils Absolute: 0.1 10*3/uL (ref 0.0–0.7)
Eosinophils Relative: 1 % (ref 0–5)
HCT: 43.1 % (ref 36.0–46.0)
Hemoglobin: 14.2 g/dL (ref 12.0–15.0)
Lymphocytes Relative: 34 % (ref 12–46)
Lymphs Abs: 2.8 10*3/uL (ref 0.7–4.0)
MCH: 30 pg (ref 26.0–34.0)
MCHC: 32.9 g/dL (ref 30.0–36.0)
MCV: 91.1 fL (ref 78.0–100.0)
Monocytes Absolute: 0.7 10*3/uL (ref 0.1–1.0)
Monocytes Relative: 9 % (ref 3–12)
Neutro Abs: 4.5 10*3/uL (ref 1.7–7.7)
Neutrophils Relative %: 55 % (ref 43–77)
Platelets: 263 10*3/uL (ref 150–400)
RBC: 4.73 MIL/uL (ref 3.87–5.11)
RDW: 12.5 % (ref 11.5–15.5)
WBC: 8.1 10*3/uL (ref 4.0–10.5)

## 2014-04-12 LAB — ETHANOL: Alcohol, Ethyl (B): <5 mg/dL (ref 0–9)

## 2014-04-12 MED ORDER — HYDROXYZINE HCL 50 MG PO TABS
50.0000 mg | ORAL_TABLET | Freq: Every day | ORAL | Status: DC
  Administered 2014-04-12 – 2014-04-13 (×2): 50 mg via ORAL
  Filled 2014-04-12 (×4): qty 1

## 2014-04-12 MED ORDER — METOPROLOL SUCCINATE ER 100 MG PO TB24
100.0000 mg | ORAL_TABLET | Freq: Every day | ORAL | Status: DC
  Administered 2014-04-13 – 2014-04-24 (×12): 100 mg via ORAL
  Filled 2014-04-12 (×15): qty 1

## 2014-04-12 MED ORDER — METOPROLOL SUCCINATE ER 100 MG PO TB24
100.0000 mg | ORAL_TABLET | Freq: Every day | ORAL | Status: DC
  Filled 2014-04-12: qty 1

## 2014-04-12 MED ORDER — ADULT MULTIVITAMIN W/MINERALS CH
1.0000 | ORAL_TABLET | Freq: Every day | ORAL | Status: DC
  Administered 2014-04-13 – 2014-04-24 (×12): 1 via ORAL
  Filled 2014-04-12 (×14): qty 1

## 2014-04-12 MED ORDER — ZOLPIDEM TARTRATE 5 MG PO TABS
5.0000 mg | ORAL_TABLET | Freq: Every evening | ORAL | Status: DC | PRN
  Administered 2014-04-12 – 2014-04-16 (×5): 5 mg via ORAL
  Filled 2014-04-12 (×5): qty 1

## 2014-04-12 MED ORDER — LAMOTRIGINE 200 MG PO TABS
200.0000 mg | ORAL_TABLET | Freq: Every day | ORAL | Status: DC
  Filled 2014-04-12: qty 1

## 2014-04-12 MED ORDER — HYDROXYZINE PAMOATE 50 MG PO CAPS
50.0000 mg | ORAL_CAPSULE | Freq: Two times a day (BID) | ORAL | Status: DC | PRN
  Filled 2014-04-12: qty 1

## 2014-04-12 MED ORDER — DIVALPROEX SODIUM ER 500 MG PO TB24
1000.0000 mg | ORAL_TABLET | Freq: Every day | ORAL | Status: DC
  Filled 2014-04-12: qty 2

## 2014-04-12 MED ORDER — HYDROCHLOROTHIAZIDE 25 MG PO TABS
25.0000 mg | ORAL_TABLET | Freq: Every day | ORAL | Status: DC
  Administered 2014-04-13 – 2014-04-24 (×12): 25 mg via ORAL
  Filled 2014-04-12 (×14): qty 1

## 2014-04-12 MED ORDER — LORAZEPAM 1 MG PO TABS
2.0000 mg | ORAL_TABLET | Freq: Once | ORAL | Status: AC
  Administered 2014-04-12: 2 mg via ORAL
  Filled 2014-04-12: qty 2

## 2014-04-12 MED ORDER — METOPROLOL SUCCINATE ER 100 MG PO TB24: 100.0000 mg | ORAL_TABLET | Freq: Every day | ORAL | Status: DC

## 2014-04-12 MED ORDER — DIVALPROEX SODIUM ER 500 MG PO TB24
1000.0000 mg | ORAL_TABLET | Freq: Every day | ORAL | Status: DC
  Administered 2014-04-12 – 2014-04-23 (×12): 1000 mg via ORAL
  Filled 2014-04-12 (×15): qty 2

## 2014-04-12 MED ORDER — LAMOTRIGINE 100 MG PO TABS
200.0000 mg | ORAL_TABLET | Freq: Every day | ORAL | Status: DC
  Administered 2014-04-12 – 2014-04-23 (×12): 200 mg via ORAL
  Filled 2014-04-12 (×4): qty 1
  Filled 2014-04-12: qty 6
  Filled 2014-04-12 (×7): qty 1
  Filled 2014-04-12 (×2): qty 3
  Filled 2014-04-12: qty 2
  Filled 2014-04-12 (×2): qty 1

## 2014-04-12 MED ORDER — ALUM & MAG HYDROXIDE-SIMETH 200-200-20 MG/5ML PO SUSP: 30.0000 mL | ORAL | Status: DC | PRN

## 2014-04-12 MED ORDER — ADULT MULTIVITAMIN W/MINERALS CH: 1.0000 | ORAL_TABLET | Freq: Every day | ORAL | Status: DC

## 2014-04-12 MED ORDER — HYDROCHLOROTHIAZIDE 25 MG PO TABS: 25.0000 mg | ORAL_TABLET | Freq: Every day | ORAL | Status: DC

## 2014-04-12 MED ORDER — LURASIDONE HCL 80 MG PO TABS
80.0000 mg | ORAL_TABLET | Freq: Every day | ORAL | Status: DC
  Filled 2014-04-12: qty 1

## 2014-04-12 MED ORDER — ZOLPIDEM TARTRATE 5 MG PO TABS: 5.0000 mg | ORAL_TABLET | Freq: Every evening | ORAL | Status: DC | PRN

## 2014-04-12 MED ORDER — LURASIDONE HCL 80 MG PO TABS
80.0000 mg | ORAL_TABLET | Freq: Every day | ORAL | Status: DC
  Administered 2014-04-12 – 2014-04-16 (×5): 80 mg via ORAL
  Filled 2014-04-12 (×6): qty 1

## 2014-04-12 MED ORDER — ACETAMINOPHEN 325 MG PO TABS
650.0000 mg | ORAL_TABLET | Freq: Four times a day (QID) | ORAL | Status: DC | PRN
Start: 2014-04-12 — End: 2014-04-24
  Administered 2014-04-13 – 2014-04-23 (×10): 650 mg via ORAL
  Filled 2014-04-12 (×10): qty 2

## 2014-04-12 MED ORDER — MAGNESIUM HYDROXIDE 400 MG/5ML PO SUSP: 30.0000 mL | Freq: Every day | ORAL | Status: DC | PRN

## 2014-04-12 NOTE — BH Assessment (Signed)
Assessment Note  [name] is an 44 y.o. female who presents as walk-in to [facility] voluntarily accompanied by mother. Patient reported "suicidal thoughts daily for last 1 1/2 weeks, mind racing and feeling on the edge of doing something."  Patient unable to contract for safety. Patient long hx of psychiatric inpatient and outpatient. Patient stated that she is dx with Bipolar and BPD as well as ADHD. Patient reported last inpatient admission was in 2013. Patient stated she recently started outpatient with Margaretmary Bayley for 1:1 therapy and DBT group therapy with Christianne Dolin.  Patient stated she receives medication management with Letta Moynahan.   Patient denies HI, AVH and substance abuse. Patient Ox4.  Patient rationalizing her thoughts of SI AEB stating "I'm accident prone. I will easy for me to make something look like an accident. Patient reported being prescribed Toprol XL, HCTZ, Adderall Ambien, Vistaril, Lamictal, Latuda, Depakote, Imitrex injection and Phenergan.   Axis I: ADHD, combined type, Bipolar, Depressed and Borderline Personality Disorder (by hx)  Past Medical History:  Past Medical History  Diagnosis Date  . Bipolar disorder   . Eczema   . Pes planus   . Achilles tendinitis   . Hypertension   . Migraine   . Achilles tendinitis   . Ganglion cyst 09/29/2009    left wrist (2 cyst)  . Arthritis   . Allergy   . Lipoma   . ADHD (attention deficit hyperactivity disorder)   . Hyperprolactinemia   . Bipolar affective   . Personality disorder     Past Surgical History  Procedure Laterality Date  . Tumor resection left thigh    . Ankle surgery  12/88    left   . Lipoma removal    . Ganglion cyst excision  2011  . Chest nodule  1990?    rt chest wall nodule removal  . Shoulder surgery  01/13/2011    right, partial tear  . Right bunioectomy      Family History:  Family History  Problem Relation Age of Onset  . Hypertension Mother   . Hyperlipidemia Mother   .  Heart attack Father   . Heart disease Father   . Hypertension Father   . Diabetes Paternal Grandfather   . Heart disease Maternal Aunt   . Breast cancer Maternal Aunt   . Heart disease Maternal Grandmother   . Cancer Maternal Grandmother     colon    Social History:  reports that she has never smoked. She has never used smokeless tobacco. She reports that she does not drink alcohol or use illicit drugs.  Additional Social History:  Alcohol / Drug Use Prescriptions: Toprol XL, HCTZ, Adderall, Ambien, Vistaril, Lamictal, Latuda, Depakote, Imitrex injection, Phenegan History of alcohol / drug use?: No history of alcohol / drug abuse  CIWA:   COWS:    Allergies:  Allergies  Allergen Reactions  . Adhesive [Tape] Itching and Rash    Also reacted to Steri Strips and Band-Aids.  . Dilaudid [Hydromorphone Hcl] Itching  . Morphine Nausea And Vomiting  . Penicillins Hives  . Percocet [Oxycodone-Acetaminophen] Itching  . Prednisone Hives  . Provera [Medroxyprogesterone Acetate] Other (See Comments)    Causes manic episodes  . Ultram [Tramadol Hcl] Itching    Home Medications:  (Not in a hospital admission)  OB/GYN Status:  No LMP recorded.  General Assessment Data Location of Assessment: [facility] Assessment Services Is this a Tele or Face-to-Face Assessment?: Face-to-Face Is this an Initial Assessment or a  Re-assessment for this encounter?: Initial Assessment Living Arrangements: Parent Can pt return to current living arrangement?: Yes Admission Status: Voluntary Is patient capable of signing voluntary admission?: Yes Transfer from: [facility] Referral Source: Self/Family/Friend  Medical Screening Exam ([place]) Medical Exam completed: No Reason for MSE not completed: Other: (Patient being transferred to [facility] for medical clearance)  [place] Living Arrangements: Parent Name of Psychiatrist: Letta Moynahan Name of Therapist: Margaretmary Bayley and [place] to self with the past 6 months Suicidal Ideation: Yes-Currently Present Suicidal Intent: Yes-Currently Present Is patient at risk for suicide?: Yes Suicidal Plan?: Yes-Currently Present Specify Current Suicidal Plan: overdose, walk in traffic, jump off bridge Access to Means: Yes Specify Access to Suicidal Means: access to pills, etc. What has been your use of drugs/alcohol within the last 12 months?: none reported Previous Attempts/Gestures: Yes How many times?:  (multiple; patient reported last attempt 2006) Other Self Harm Risks: none reported Triggers for Past Attempts: Unknown Intentional Self Injurious Behavior: None Family Suicide History: Unknown Recent stressful life event(s): Other (Comment) (none reported) Persecutory voices/beliefs?: No Depression: Yes Depression Symptoms: Despondent, Insomnia, Isolating, Loss of interest in usual pleasures, Feeling worthless/self pity Substance abuse history and/or treatment for substance abuse?: No Suicide prevention information given to non-admitted patients: Not applicable  Risk to Others within the past 6 months Homicidal Ideation: No Thoughts of Harm to Others: No Current Homicidal Intent: No Current Homicidal Plan: No Access to Homicidal Means: No Identified Victim: NA History of harm to others?: No Assessment of Violence: None Noted Violent Behavior Description: Patient cooperative at assessment. Does patient have access to weapons?: No Criminal Charges Pending?: No Does patient have a court date: No  Psychosis Hallucinations: None noted Delusions: None noted  Mental Status Report Appear/Hygiene: Unremarkable Eye Contact: Good Motor Activity: Unremarkable Speech: Logical/coherent Level of Consciousness: Alert Mood: Angry, Sad Affect: Anxious, Depressed Anxiety Level: Minimal Thought Processes: Coherent, Relevant Judgement: Unimpaired Orientation: Person, Place, Time, Situation Obsessive  Compulsive Thoughts/Behaviors: Minimal  Cognitive Functioning Concentration: Normal Memory: Recent Intact, Remote Intact IQ: Average Insight: Fair Impulse Control: Fair Appetite: Fair Weight Loss:  (unk) Weight Gain:  (unk) Sleep: Decreased Total Hours of Sleep:  (unk) Vegetative Symptoms: None  ADLScreening [facility] Assessment Services) Patient's cognitive ability adequate to safely complete daily activities?: Yes Patient able to express need for assistance with ADLs?: Yes Independently performs ADLs?: Yes (appropriate for developmental age)  Prior Inpatient Therapy Prior Inpatient Therapy: Yes Prior Therapy Dates: most recent 2013 Prior Therapy Facilty/Provider(s): [facility] Reason for Treatment: sucidal ideations  Prior Outpatient Therapy Prior Outpatient Therapy: Yes Prior Therapy Dates: current Prior Therapy Facilty/Provider(s): [facility] Christianne Dolin) Reason for Treatment: Bipolar, BPD, ADHD  ADL Screening (condition at time of admission) Patient's cognitive ability adequate to safely complete daily activities?: Yes Is the patient deaf or have difficulty hearing?: No Does the patient have difficulty seeing, even when wearing glasses/contacts?: No Does the patient have difficulty concentrating, remembering, or making decisions?: No Patient able to express need for assistance with ADLs?: Yes Does the patient have difficulty dressing or bathing?: No Independently performs ADLs?: Yes (appropriate for developmental age)       Abuse/Neglect Assessment (Assessment to be complete while patient is alone) Physical Abuse: Denies Verbal Abuse: Denies Sexual Abuse: Denies Exploitation of patient/patient's resources: Denies Self-Neglect: Denies     Regulatory affairs officer (For Healthcare) Does patient have an advance directive?: No    Additional Information 1:1 In Past  12 Months?: No CIRT Risk: No Elopement Risk: No Does patient have medical clearance?: No    Disposition:  Clinician reviewed patient with Heloise Purpura who reported patient meets inpatient criteria. Per [facility] no available beds at [facility]. TTS will seek placement.   Disposition Initial Assessment Completed for this Encounter: Yes Disposition of Patient: Inpatient treatment program Type of inpatient treatment program: Adult  On Site Evaluation by:   Reviewed with Physician:    Essie Christine 04/12/2014 3:56 PM

## 2014-04-12 NOTE — ED Notes (Signed)
Clothes given to Driver Betsy Pries)

## 2014-04-12 NOTE — Tx Team (Signed)
Initial Interdisciplinary Treatment Plan   PATIENT STRESSORS: Financial difficulties Disability review in process   PATIENT STRENGTHS: Ability for insight Average or above average intelligence Communication skills Supportive family/friends   PROBLEM LIST: Problem List/Patient Goals Date to be addressed Date deferred Reason deferred Estimated date of resolution  Depression "I need med adjustment" 04/12/14   At d/c  Suicide Ideation "I need to figure basic things to do to get better" 04/12/14   At d/c  Anxiety 04/12/14   At d/c                                       DISCHARGE CRITERIA:  Ability to meet basic life and health needs Improved stabilization in mood, thinking, and/or behavior Motivation to continue treatment in a less acute level of care Need for constant or close observation no longer present Reduction of life-threatening or endangering symptoms to within safe limits Verbal commitment to aftercare and medication compliance  PRELIMINARY DISCHARGE PLAN: Outpatient therapy Return to previous living arrangement  PATIENT/FAMIILY INVOLVEMENT: This treatment plan has been presented to and reviewed with the patient, [name]. The patient and family have been given the opportunity to ask questions and make suggestions.  Apolinar Junes 04/12/2014, 10:34 PM

## 2014-04-12 NOTE — Progress Notes (Signed)
Psychoeducational Group Note  Date:  04/12/2014 Time:  2213  Group Topic/Focus:  Wrap-Up Group:   The focus of this group is to help patients review their daily goal of treatment and discuss progress on daily workbooks.  Participation Level: Did Not Attend  Participation Quality:  Not Applicable  Affect:  Not Applicable  Cognitive:  Not Applicable  Insight:  Not Applicable  Engagement in Group: Not Applicable  Additional Comments:  The patient did not attend group this evening since she was admitted after the group.  Archie Balboa S 04/12/2014, 10:13 PM

## 2014-04-12 NOTE — ED Notes (Signed)
Patient was sent from [facility] to be medically cleared. Patient states she had SI and has several plans. Patient stated she was not repeating this over and over again to different people. Patient stated it takes klonger to answer questions and get help than it does to go ahead and kill yourself.

## 2014-04-12 NOTE — BH Assessment (Signed)
Patient presented as a walk-in to [facility]. Patient presented voluntarily with her mom reporting suicidal thoughts daily for the last 1 1/2 weeks, racing thoughts, and feels "on the edge of doing something." Patient unable to contract for safety.    TTS assessment completed.   Per Heloise Purpura, NP patient meets inpatient criteria. Patient to be transferred to [facility] for medical clearance. No available beds at [facility] at this time.   Rigoberto Noel, MSW, LCSW Triage Specialist [phone],

## 2014-04-12 NOTE — ED Provider Notes (Signed)
CSN: [id number]     Arrival date & time 04/12/14  1617 History   First MD Initiated Contact with Patient 04/12/14 1737     Chief Complaint  Patient presents with  . Medical Clearance      HPI Patient was sent from [facility] to be medically cleared. Patient states she had SI and has several plans. Patient stated she was not repeating this over and over again to different people. Patient stated it takes longer to answer questions and get help than it does to go ahead and kill yourself. Past Medical History  Diagnosis Date  . Bipolar disorder   . Eczema   . Pes planus   . Achilles tendinitis   . Hypertension   . Migraine   . Achilles tendinitis   . Ganglion cyst 09/29/2009    left wrist (2 cyst)  . Arthritis   . Allergy   . Lipoma   . ADHD (attention deficit hyperactivity disorder)   . Hyperprolactinemia   . Bipolar affective   . Personality disorder    Past Surgical History  Procedure Laterality Date  . Tumor resection left thigh    . Ankle surgery  12/88    left   . Lipoma removal    . Ganglion cyst excision  2011  . Chest nodule  1990?    rt chest wall nodule removal  . Shoulder surgery  01/13/2011    right, partial tear  . Right bunioectomy     Family History  Problem Relation Age of Onset  . Hypertension Mother   . Hyperlipidemia Mother   . Heart attack Father   . Heart disease Father   . Hypertension Father   . Diabetes Paternal Grandfather   . Heart disease Maternal Aunt   . Breast cancer Maternal Aunt   . Heart disease Maternal Grandmother   . Cancer Maternal Grandmother     colon   History  Substance Use Topics  . Smoking status: Never Smoker   . Smokeless tobacco: Never Used  . Alcohol Use: No   OB History    No data available     Review of Systems  All other systems reviewed and are negative  Allergies  Adhesive; Dilaudid; Morphine; Penicillins; Percocet; Prednisone; Provera; and Ultram  Home Medications   Prior to Admission medications    Medication Sig Start Date End Date Taking? Authorizing Provider  Ascorbic Acid (VITAMIN C PO) Take 1 tablet by mouth daily.   Yes Historical Provider, MD  Cyanocobalamin (VITAMIN B-12 PO) Take 1 tablet by mouth daily.   Yes Historical Provider, MD  divalproex (DEPAKOTE ER) 500 MG 24 hr tablet Take 2 tablets (1,000 mg total) by mouth at bedtime. For mood stabilization. 10/30/11  Yes Milana Huntsman Readling, MD  hydrochlorothiazide (HYDRODIURIL) 25 MG tablet Take 1 tablet (25 mg total) by mouth daily. For blood pressure control. 10/30/11  Yes Milana Huntsman Readling, MD  hydrOXYzine (VISTARIL) 25 MG capsule Take 50 mg by mouth 2 (two) times daily as needed for anxiety (sleep).  03/07/13  Yes Historical Provider, MD  ibuprofen (ADVIL,MOTRIN) 200 MG tablet Take 600 mg by mouth every 6 (six) hours as needed for fever or moderate pain (pain).    Yes Historical Provider, MD  lamoTRIgine (LAMICTAL) 200 MG tablet Take 200 mg by mouth at bedtime.    Yes Historical Provider, MD  lurasidone (LATUDA) 80 MG TABS Take 1 tablet (80 mg total) by mouth daily at 6 PM. For psychosis and mood stabilization.  10/30/11  Yes Milana Huntsman Readling, MD  metoprolol succinate (TOPROL-XL) 100 MG 24 hr tablet Take 1 tablet (100 mg total) by mouth daily. Take with or immediately following a meal for blood pressure control. 10/30/11  Yes Mellissa Kohut, MD  Multiple Vitamin (MULTIVITAMIN WITH MINERALS) TABS Take 1 tablet by mouth daily. For nutritional supplementation. 10/30/11  Yes Mellissa Kohut, MD  Multiple Vitamins-Minerals (ZINC PO) Take 1 tablet by mouth daily.   Yes Historical Provider, MD  OVER THE COUNTER MEDICATION Place 1 drop into both eyes daily as needed (dry eyes). Optix eye drops   Yes Historical Provider, MD  promethazine (PHENERGAN) 25 MG tablet Take 1 tablet (25 mg total) by mouth every 6 (six) hours as needed for nausea. 09/27/12  Yes Marcial Pacas, MD  SUMAtriptan (IMITREX) 6 MG/0.5ML SOLN injection Inject 0.5 mLs (6 mg total) into the  skin daily as needed for migraine. 12/31/13  Yes [name] P Millikan, NP  zolpidem (AMBIEN) 10 MG tablet Take 5 mg by mouth at bedtime.    Yes Historical Provider, MD  clobetasol cream (TEMOVATE) 8.46 % Apply 1 application topically 2 (two) times daily as needed (skin redness).     Historical Provider, MD  Gabapentin, PHN, (GRALISE) 600 MG TABS Take 600 mg by mouth at bedtime.    Historical Provider, MD  naftifine (NAFTIN) 1 % cream Apply 1 application topically daily.    Historical Provider, MD   BP 116/83 mmHg  Pulse 64  Temp(Src) 97.8 F (36.6 C) (Oral)  Resp 18  SpO2 100%  LMP 03/29/2014 Physical Exam Physical Exam  Nursing note and vitals reviewed. Constitutional: She is oriented to person, place, and time. She appears well-developed and well-nourished. No distress.  HENT:  Head: Normocephalic and atraumatic.  Eyes: Pupils are equal, round, and reactive to light.  Neck: Normal range of motion.  Cardiovascular: Normal rate and intact distal pulses.   Pulmonary/Chest: No respiratory distress.  Abdominal: Normal appearance. She exhibits no distension.  Musculoskeletal: Normal range of motion.  Neurological: She is alert and oriented to person, place, and time. No cranial nerve deficit.  Skin: Skin is warm and dry. No rash noted.  Psychiatric: Some anxiety.  Patient has some suicidal ideations but no definite plan.  ED Course  Procedures (including critical care time) Labs Review Labs Reviewed  COMPREHENSIVE METABOLIC PANEL - Abnormal; Notable for the following:    GFR calc non Af Amer 78 (*)    All other components within normal limits  URINE RAPID DRUG SCREEN (HOSP PERFORMED) - Abnormal; Notable for the following:    Amphetamines POSITIVE (*)    All other components within normal limits  CBC WITH DIFFERENTIAL/PLATELET  ETHANOL    Imaging Review No results found.    MDM   Final diagnoses:  Bipolar affective disorder, depressed, severe  Suicidal ideation         Leonard Schwartz, MD 04/16/14 [phone]

## 2014-04-12 NOTE — ED Notes (Signed)
I went to collect labs and patient was in the restroom.  I will be back to collect when patient return.

## 2014-04-12 NOTE — BH Assessment (Signed)
Once medically cleared patient accepted to bed 402-2 Dr. Parke Poisson. Patient to be transported after [place], MSW, LCSW Triage Specialist [phone],

## 2014-04-13 DIAGNOSIS — R45851 Suicidal ideations: Secondary | ICD-10-CM

## 2014-04-13 DIAGNOSIS — F3160 Bipolar disorder, current episode mixed, unspecified: Secondary | ICD-10-CM

## 2014-04-13 MED ORDER — HYDROXYZINE HCL 50 MG PO TABS
50.0000 mg | ORAL_TABLET | Freq: Two times a day (BID) | ORAL | Status: DC | PRN
  Administered 2014-04-13: 50 mg via ORAL

## 2014-04-13 MED ORDER — HYDROXYZINE HCL 50 MG PO TABS
ORAL_TABLET | ORAL | Status: AC
  Administered 2014-04-13: 50 mg via ORAL
  Filled 2014-04-13: qty 1

## 2014-04-13 MED ORDER — OLANZAPINE 5 MG PO TBDP
5.0000 mg | ORAL_TABLET | Freq: Three times a day (TID) | ORAL | Status: DC | PRN
  Administered 2014-04-13: 5 mg via ORAL
  Filled 2014-04-13: qty 1

## 2014-04-13 NOTE — Clinical Social Work Note (Signed)
Per patient's request, message left for supervisor to contact CSW regarding an employee admitting to the hospital.

## 2014-04-13 NOTE — Progress Notes (Signed)
Pt presents to [facility] alert, irritable and depressed.+SI w/ multiple plans to include overdose and jump off bridge, -HI  "they won't go away", -A/[place]. Pt verbally contracts for safety inside hospital only.  Pt has hx of multiple previous attempts. Pt lives with her mother and is employed.She denies drugs or alcohol use. Reports stressors of long work hours, financial issues, disability being under review and left eye cataract "Everything bleeding into each other".  C/o "mind racing,  insomnia and loss of interest in things. "I need med adjustment, I need to figure basic things to do to get better". Long hx of treatment and previous ECT in 2008.  Emotional support and encouragement given. Pt admitted for evaluation, stabilization and reduction of baseline. Will monitor closely.
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
## 2014-04-13 NOTE — H&P (Signed)
Psychiatric Admission Assessment Adult  Patient Identification: [name] MRN:  [id number] Date of Evaluation:  04/13/2014 Chief Complaint:   " Depression" Principal Diagnosis: Bipolar Disorder, Mixed  Diagnosis:   Patient Active Problem List   Diagnosis Date Noted  . Bipolar affective disorder, depressed, severe [F31.4] 04/12/2014  . Suicidal ideation [R45.851] 04/12/2014  . Injury of right shoulder and upper arm [S49.91XA] 02/17/2014  . Left leg pain [M79.605] 06/03/2013  . Right hip pain [M25.551] 06/03/2013  . Migraine with status migrainosus [G43.901] 01/08/2013  . Personality disorder [F60.9]   . Chronic migraine [G43.709] 05/08/2012  . Contact dermatitis [L25.9] 11/27/2011  . Major depressive disorder, recurrent episode [F33.9] 10/27/2011  . Generalized anxiety disorder [F41.1] 10/27/2011  . 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 Borderline personality disorder [F60.3] 10/27/2011  . Right foot pain [M79.671] 09/28/2011  . 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 Ganglion cyst [M67.40] 09/29/2009  . 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 BIPOLAR DISORDER UNSPECIFIED [F31.9] 06/09/2008   History of Present Illness::  Patient is a 44 year old female. She reports a long psychiatric history and states she has been diagnosed with Bipolar Disorder and Borderline Personality Disorder in the past. She states " I have been admitted to psychiatric hospitals many times , but had not needed to come in since 2013.". She presented to ED accompanied by mother, reporting depression, anxiety, and suicidal ideations, with different thoughts including jumping from a height. She also reports a subjective sense  Of racing thoughts, increased energy level ,  and irritability. She states job and disability issues have been one contributing stressor. At  This time she presents with some  irritability, depression, although affect does tend to improve as session progresses .    Elements:  Worsening Depression, with some symptoms suggesting Mixed episode, in the context of chronic mental illness ( Has been diagnosed with Bipolar Disorder) , and stressors  Associated Signs/Symptoms: Depression Symptoms:  depressed mood, anhedonia, recurrent thoughts of death, suicidal thoughts without plan, anxiety, decreased appetite, (Hypo) Manic Symptoms:  Increased energy, irritability, and racing thoughts  Anxiety Symptoms:  Some free floating anxiety Psychotic Symptoms:  no psychotic symptoms PTSD Symptoms: Currently not endorsing  Total Time spent with patient: 45 minutes  Past Psychiatric History- reports a long history of mental illness, states she has been diagnosed with Bipolar Disorder, ADHD, and with Borderline Personality Disorder. Reports multiple psychiatric admissions since around 2001, but last one was in 2013.  States she had been in DBT therapy in the past,  Had stopped, but recently reconnected with her therapist due to her psychiatric worsening. Reports history of suicidal attempts /gestures in the past.  Past Medical History:  Past Medical History  Diagnosis Date  . Bipolar disorder   . Eczema   . Pes planus   . Achilles tendinitis   . Hypertension   . Migraine   . Achilles tendinitis   . Ganglion cyst 09/29/2009    left wrist (2 cyst)  . Arthritis   . Allergy   . Lipoma   . ADHD (attention deficit hyperactivity disorder)   . Hyperprolactinemia   . Bipolar affective   . Personality disorder     Past Surgical History  Procedure Laterality Date  . Tumor resection left thigh    . Ankle surgery  12/88    left   . Lipoma removal    . Ganglion cyst  excision  2011  . Chest nodule  1990?    rt chest wall nodule removal  . Shoulder surgery  01/13/2011    right, partial tear  . Right bunioectomy     Family History: father left family when she was young.  Has good relationship with mother. States father had Bipolar Disorder. No suicide attempts in family. Has one brother. Family History  Problem Relation Age of Onset  . Hypertension Mother   . Hyperlipidemia Mother   . Heart attack Father   . Heart disease Father   . Hypertension Father   . Diabetes Paternal Grandfather   . Heart disease Maternal Aunt   . Breast cancer Maternal Aunt   . Heart disease Maternal Grandmother   . Cancer Maternal Grandmother     colon   Social History:  She is single, no children, college degree, works at a [employer], lives with mother, denies legal issues . History  Alcohol Use No     History  Drug Use No    History   Social History  . Marital Status: Single    Spouse Name: N/A  . Number of Children: 0  . 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 Smoking status: Never Smoker   . Smokeless tobacco: Never Used  . Alcohol Use: No  . Drug Use: No  . Sexual Activity: Yes    Birth Control/ Protection: Pill   Other Topics Concern  . None   Social History Narrative   Additional Social History:    Pain Medications: denies Prescriptions: denies abuse Over the Counter: denies History of alcohol / drug use?: No history of alcohol / drug abuse   Musculoskeletal: Strength & Muscle Tone: within normal limits and flaccid Gait & Station: normal Patient leans: N/A  Psychiatric Specialty Exam: Physical Exam  Review of Systems  Constitutional: Negative for fever and chills.  Respiratory: Negative for cough and shortness of breath.   Cardiovascular: Negative for chest pain.  Gastrointestinal: Negative for nausea, vomiting and diarrhea.  Genitourinary: Negative for dysuria, urgency and frequency.  Skin: Negative for rash.  Neurological: Negative for seizures and headaches.  Psychiatric/Behavioral: Positive for depression and suicidal ideas.    Blood pressure 111/77, pulse 83,  temperature 97.5 F (36.4 C), temperature source Oral, resp. rate 18, height 5' 9"  (1.753 m), weight 233 lb (105.688 kg), last menstrual period 03/29/2014.Body mass index is 34.39 kg/(m^2).  General Appearance: Fairly Groomed  Engineer, water::  Good  Speech:  Normal Rate  Volume:  Normal  Mood:  Depressed and Irritable  Affect:  Congruent and Labile  Thought Process:  Goal Directed and Linear  Orientation:  Full (Time, Place, and Person)  Thought Content:  denies hallucinations, no delusions, no grandiose ideations  Suicidal Thoughts:  Yes.  without intent/plan- at this time denies plan or intention of hurting self   Homicidal Thoughts:  No  Memory:  recent and remote grossly intact   Judgement:  Fair  Insight:  Fair  Psychomotor Activity:  somewhat increased, pacing hallway  Concentration:  Good  Recall:  Good  Fund of Knowledge:Good  Language: Good  Akathisia:  Negative  Handed:  Right  AIMS (if indicated):     Assets:  Desire for Improvement Housing Social Support Talents/Skills  ADL's: fair  Cognition: WNL  Sleep:  Number of Hours: 6.25   Risk to Self: Is patient at risk for suicide?: Yes What  has been your use of drugs/alcohol within the last 12 months?: None Risk to Others:   Prior Inpatient Therapy:   Prior Outpatient Therapy:    Alcohol Screening: 1. How often do you have a drink containing alcohol?: Never 2. How many drinks containing alcohol do you have on a typical day when you are drinking?: 1 or 2 3. How often do you have six or more drinks on one occasion?: Never Preliminary Score: 0 4. How often during the last year have you found that you were not able to stop drinking once you had started?: Never 5. How often during the last year have you failed to do what was normally expected from you becasue of drinking?: Never 6. How often during the last year have you needed a first drink in the morning to get yourself going after a heavy drinking session?: Never 7. How  often during the last year have you had a feeling of guilt of remorse after drinking?: Never 8. How often during the last year have you been unable to remember what happened the night before because you had been drinking?: Never 9. Have you or someone else been injured as a result of your drinking?: No 10. Has a relative or friend or a doctor or another health worker been concerned about your drinking or suggested you cut down?: No Alcohol Use Disorder Identification Test Final Score (AUDIT): 0 Brief Intervention: AUDIT score less than 7 or less-screening does not suggest unhealthy drinking-brief intervention not indicated  Allergies:   Allergies  Allergen Reactions  . Adhesive [Tape] Itching and Rash    Also reacted to Steri Strips and Band-Aids.  . Dilaudid [Hydromorphone Hcl] Itching  . Morphine Nausea And Vomiting  . Penicillins Hives  . Percocet [Oxycodone-Acetaminophen] Itching  . Prednisone Hives  . Provera [Medroxyprogesterone Acetate] Other (See Comments)    Causes manic episodes  . Ultram [Tramadol Hcl] Itching   Lab Results:  Results for orders placed or performed during the hospital encounter of 04/12/14 (from the past 48 hour(s))  Drug screen panel, emergency     Status: Abnormal   Collection Time: 04/12/14  4:47 PM  Result Value Ref Range   Opiates NONE DETECTED NONE DETECTED   Cocaine NONE DETECTED NONE DETECTED   Benzodiazepines NONE DETECTED NONE DETECTED   Amphetamines POSITIVE (A) NONE DETECTED   Tetrahydrocannabinol NONE DETECTED NONE DETECTED   Barbiturates NONE DETECTED NONE DETECTED    Comment:        DRUG SCREEN FOR MEDICAL PURPOSES ONLY.  IF CONFIRMATION IS NEEDED FOR ANY PURPOSE, NOTIFY LAB WITHIN 5 DAYS.        LOWEST DETECTABLE LIMITS FOR URINE DRUG SCREEN Drug Class       Cutoff (ng/mL) Amphetamine      1000 Barbiturate      200 Benzodiazepine   177 Tricyclics       116 Opiates          300 Cocaine          300 THC              50   CBC  WITH DIFFERENTIAL     Status: None   Collection Time: 04/12/14  5:14 PM  Result Value Ref Range   WBC 8.1 4.0 - 10.5 K/uL   RBC 4.73 3.87 - 5.11 MIL/uL   Hemoglobin 14.2 12.0 - 15.0 g/dL   HCT 43.1 36.0 - 46.0 %   MCV 91.1 78.0 - 100.0 fL  MCH 30.0 26.0 - 34.0 pg   MCHC 32.9 30.0 - 36.0 g/dL   RDW 12.5 11.5 - 15.5 %   Platelets 263 150 - 400 K/uL   Neutrophils Relative % 55 43 - 77 %   Neutro Abs 4.5 1.7 - 7.7 K/uL   Lymphocytes Relative 34 12 - 46 %   Lymphs Abs 2.8 0.7 - 4.0 K/uL   Monocytes Relative 9 3 - 12 %   Monocytes Absolute 0.7 0.1 - 1.0 K/uL   Eosinophils Relative 1 0 - 5 %   Eosinophils Absolute 0.1 0.0 - 0.7 K/uL   Basophils Relative 1 0 - 1 %   Basophils Absolute 0.0 0.0 - 0.1 K/uL  Comprehensive metabolic panel     Status: Abnormal   Collection Time: 04/12/14  5:14 PM  Result Value Ref Range   Sodium 139 135 - 145 mmol/L   Potassium 3.7 3.5 - 5.1 mmol/L   Chloride 104 96 - 112 mmol/L   CO2 27 19 - 32 mmol/L   Glucose, Bld 83 70 - 99 mg/dL   BUN 10 6 - 23 mg/dL   Creatinine, Ser 0.89 0.50 - 1.10 mg/dL   Calcium 9.5 8.4 - 10.5 mg/dL   Total Protein 6.6 6.0 - 8.3 g/dL   Albumin 4.1 3.5 - 5.2 g/dL   AST 21 0 - 37 U/L   ALT 13 0 - 35 U/L   Alkaline Phosphatase 63 39 - 117 U/L   Total Bilirubin 0.6 0.3 - 1.2 mg/dL   GFR calc non Af Amer 78 (L) >90 mL/min   GFR calc Af Amer >90 >90 mL/min    Comment: (NOTE) The eGFR has been calculated using the CKD EPI equation. This calculation has not been validated in all clinical situations. eGFR's persistently <90 mL/min signify possible Chronic Kidney Disease.    Anion gap 8 5 - 15  Ethanol     Status: None   Collection Time: 04/12/14  5:14 PM  Result Value Ref Range   Alcohol, Ethyl (B) <5 0 - 9 mg/dL    Comment:        LOWEST DETECTABLE LIMIT FOR SERUM ALCOHOL IS 11 mg/dL FOR MEDICAL PURPOSES ONLY    Current Medications: Current Facility-Administered Medications  Medication Dose Route Frequency Provider  Last Rate Last Dose  . acetaminophen (TYLENOL) tablet 650 mg  650 mg Oral Q6H PRN Patrecia Pour, NP   650 mg at 04/13/14 1248  . alum & mag hydroxide-simeth (MAALOX/MYLANTA) 200-200-20 MG/5ML suspension 30 mL  30 mL Oral Q4H PRN Patrecia Pour, NP      . divalproex (DEPAKOTE ER) 24 hr tablet 1,000 mg  1,000 mg Oral QHS Patrecia Pour, NP   1,000 mg at 04/12/14 2222  . hydrochlorothiazide (HYDRODIURIL) tablet 25 mg  25 mg Oral Daily Patrecia Pour, NP   25 mg at 04/13/14 0827  . hydrOXYzine (ATARAX/VISTARIL) 50 MG tablet           . hydrOXYzine (ATARAX/VISTARIL) tablet 50 mg  50 mg Oral QHS Harriet Butte, NP   50 mg at 04/12/14 2256  . hydrOXYzine (ATARAX/VISTARIL) tablet 50 mg  50 mg Oral BID PRN Nicholaus Bloom, MD   50 mg at 04/13/14 1010  . lamoTRIgine (LAMICTAL) tablet 200 mg  200 mg Oral QHS Patrecia Pour, NP   200 mg at 04/12/14 2223  . lurasidone (LATUDA) tablet 80 mg  80 mg Oral q1800 Patrecia Pour, NP  80 mg at 04/12/14 2224  . magnesium hydroxide (MILK OF MAGNESIA) suspension 30 mL  30 mL Oral Daily PRN Patrecia Pour, NP      . metoprolol succinate (TOPROL-XL) 24 hr tablet 100 mg  100 mg Oral Daily Patrecia Pour, NP   100 mg at 04/13/14 0827  . multivitamin with minerals tablet 1 tablet  1 tablet Oral Daily Patrecia Pour, NP   1 tablet at 04/13/14 0827  . OLANZapine zydis (ZYPREXA) disintegrating tablet 5 mg  5 mg Oral Q8H PRN Jenne Campus, MD   5 mg at 04/13/14 1605  . zolpidem (AMBIEN) tablet 5 mg  5 mg Oral QHS PRN Patrecia Pour, NP   5 mg at 04/12/14 2222   PTA Medications: Facility-administered medications prior to admission  Medication Dose Route Frequency Provider Last Rate Last Dose  . valproate (DEPACON) 1,000 mg in sodium chloride 0.9 % 100 mL IVPB  1,000 mg Intravenous Continuous Larey Seat, MD   Stopped at 09/27/12 1255   Prescriptions prior to admission  Medication Sig Dispense Refill Last Dose  . Ascorbic Acid (VITAMIN C PO) Take 1 tablet by mouth  daily.   04/12/2014 at Unknown time  . clobetasol cream (TEMOVATE) 0.86 % Apply 1 application topically 2 (two) times daily as needed (skin redness).    unknown at unknown time  . Cyanocobalamin (VITAMIN B-12 PO) Take 1 tablet by mouth daily.   04/12/2014 at Unknown time  . divalproex (DEPAKOTE ER) 500 MG 24 hr tablet Take 2 tablets (1,000 mg total) by mouth at bedtime. For mood stabilization. 60 tablet 0 04/11/2014 at 2400  . Gabapentin, PHN, (GRALISE) 600 MG TABS Take 600 mg by mouth at bedtime.   unknown at unknown time  . hydrochlorothiazide (HYDRODIURIL) 25 MG tablet Take 1 tablet (25 mg total) by mouth daily. For blood pressure control. 30 tablet 0 04/12/2014 at Unknown time  . hydrOXYzine (VISTARIL) 25 MG capsule Take 50 mg by mouth 2 (two) times daily as needed for anxiety (sleep).    04/11/2014 at Unknown time  . ibuprofen (ADVIL,MOTRIN) 200 MG tablet Take 600 mg by mouth every 6 (six) hours as needed for fever or moderate pain (pain).    04/11/2014 at Unknown time  . lamoTRIgine (LAMICTAL) 200 MG tablet Take 200 mg by mouth at bedtime.    04/11/2014 at 2400  . lurasidone (LATUDA) 80 MG TABS Take 1 tablet (80 mg total) by mouth daily at 6 PM. For psychosis and mood stabilization. 30 tablet 0 04/11/2014 at Unknown time  . metoprolol succinate (TOPROL-XL) 100 MG 24 hr tablet Take 1 tablet (100 mg total) by mouth daily. Take with or immediately following a meal for blood pressure control. 30 tablet 0 04/12/2014 at 0830  . Multiple Vitamin (MULTIVITAMIN WITH MINERALS) TABS Take 1 tablet by mouth daily. For nutritional supplementation. 30 tablet 0 04/12/2014 at Unknown time  . Multiple Vitamins-Minerals (ZINC PO) Take 1 tablet by mouth daily.   04/12/2014 at Unknown time  . naftifine (NAFTIN) 1 % cream Apply 1 application topically daily.   unknown at unknown time  . OVER THE COUNTER MEDICATION Place 1 drop into both eyes daily as needed (dry eyes). Optix eye drops   Past Month at Unknown time  .  promethazine (PHENERGAN) 25 MG tablet Take 1 tablet (25 mg total) by mouth every 6 (six) hours as needed for nausea. 30 tablet 6 Past Month at Unknown time  . SUMAtriptan (IMITREX) 6  MG/0.5ML SOLN injection Inject 0.5 mLs (6 mg total) into the skin daily as needed for migraine. 6 vial 3 Past Month at Unknown time  . zolpidem (AMBIEN) 10 MG tablet Take 5 mg by mouth at bedtime.    04/11/2014 at Unknown time    Previous Psychotropic Medications: Yes - see above. As noted, most recently was taking Lamictal, Latuda, Depakote ER, Ambien, Adderall.  Substance Abuse History in the last 12 months:  No.    Consequences of Substance Abuse: NA  Results for orders placed or performed during the hospital encounter of 04/12/14 (from the past 72 hour(s))  Drug screen panel, emergency     Status: Abnormal   Collection Time: 04/12/14  4:47 PM  Result Value Ref Range   Opiates NONE DETECTED NONE DETECTED   Cocaine NONE DETECTED NONE DETECTED   Benzodiazepines NONE DETECTED NONE DETECTED   Amphetamines POSITIVE (A) NONE DETECTED   Tetrahydrocannabinol NONE DETECTED NONE DETECTED   Barbiturates NONE DETECTED NONE DETECTED    Comment:        DRUG SCREEN FOR MEDICAL PURPOSES ONLY.  IF CONFIRMATION IS NEEDED FOR ANY PURPOSE, NOTIFY LAB WITHIN 5 DAYS.        LOWEST DETECTABLE LIMITS FOR URINE DRUG SCREEN Drug Class       Cutoff (ng/mL) Amphetamine      1000 Barbiturate      200 Benzodiazepine   161 Tricyclics       096 Opiates          300 Cocaine          300 THC              50   CBC WITH DIFFERENTIAL     Status: None   Collection Time: 04/12/14  5:14 PM  Result Value Ref Range   WBC 8.1 4.0 - 10.5 K/uL   RBC 4.73 3.87 - 5.11 MIL/uL   Hemoglobin 14.2 12.0 - 15.0 g/dL   HCT 43.1 36.0 - 46.0 %   MCV 91.1 78.0 - 100.0 fL   MCH 30.0 26.0 - 34.0 pg   MCHC 32.9 30.0 - 36.0 g/dL   RDW 12.5 11.5 - 15.5 %   Platelets 263 150 - 400 K/uL   Neutrophils Relative % 55 43 - 77 %   Neutro Abs 4.5 1.7  - 7.7 K/uL   Lymphocytes Relative 34 12 - 46 %   Lymphs Abs 2.8 0.7 - 4.0 K/uL   Monocytes Relative 9 3 - 12 %   Monocytes Absolute 0.7 0.1 - 1.0 K/uL   Eosinophils Relative 1 0 - 5 %   Eosinophils Absolute 0.1 0.0 - 0.7 K/uL   Basophils Relative 1 0 - 1 %   Basophils Absolute 0.0 0.0 - 0.1 K/uL  Comprehensive metabolic panel     Status: Abnormal   Collection Time: 04/12/14  5:14 PM  Result Value Ref Range   Sodium 139 135 - 145 mmol/L   Potassium 3.7 3.5 - 5.1 mmol/L   Chloride 104 96 - 112 mmol/L   CO2 27 19 - 32 mmol/L   Glucose, Bld 83 70 - 99 mg/dL   BUN 10 6 - 23 mg/dL   Creatinine, Ser 0.89 0.50 - 1.10 mg/dL   Calcium 9.5 8.4 - 10.5 mg/dL   Total Protein 6.6 6.0 - 8.3 g/dL   Albumin 4.1 3.5 - 5.2 g/dL   AST 21 0 - 37 U/L   ALT 13 0 - 35 U/L  Alkaline Phosphatase 63 39 - 117 U/L   Total Bilirubin 0.6 0.3 - 1.2 mg/dL   GFR calc non Af Amer 78 (L) >90 mL/min   GFR calc Af Amer >90 >90 mL/min    Comment: (NOTE) The eGFR has been calculated using the CKD EPI equation. This calculation has not been validated in all clinical situations. eGFR's persistently <90 mL/min signify possible Chronic Kidney Disease.    Anion gap 8 5 - 15  Ethanol     Status: None   Collection Time: 04/12/14  5:14 PM  Result Value Ref Range   Alcohol, Ethyl (B) <5 0 - 9 mg/dL    Comment:        LOWEST DETECTABLE LIMIT FOR SERUM ALCOHOL IS 11 mg/dL FOR MEDICAL PURPOSES ONLY     Observation Level/Precautions:  15 minute checks  Laboratory:  Will order TSH   Psychotherapy:  Milieu, group  Medications:  For now, continue current medication regimen with Latuda, Lamictal , Depakote ER, which she states has been effective and well tolerated   Consultations:  As needed   Discharge Concerns:  Chronic mental illness   Estimated LOS: 6 days   Other:     Psychological Evaluations: No   Treatment Plan Summary: Daily contact with patient to assess and evaluate symptoms and progress in treatment,  Medication management, Plan inpatient psychiatric admission, medications as above  and medications as above   Medical Decision Making:  Review of Psycho-Social Stressors (1), Review or order clinical lab tests (1), Established Problem, Worsening (2) and Review of Medication Regimen & Side Effects (2)  I certify that inpatient services furnished can reasonably be expected to improve the patient's condition.   Neita Garnet 3/14/20165:39 PM   Of note, at time of this writing,patient has been placed on 1:1 observation for safety due to inability to contract for safety later in day.

## 2014-04-13 NOTE — Progress Notes (Signed)
Patient ID: [name], female   DOB: [birth year], 44 y.o.   MRN: [id number] Received report from S. Quentin Cornwall, RN. Writer introduced self to client, reviewed medications, administered as ordered. Staff will monitor q67min for safety. Client is safe on the unit.

## 2014-04-13 NOTE — BHH Group Notes (Signed)
[facility] LCSW Group Therapy          Overcoming Obstacles       1:15 -2:30        04/13/2014       Type of Therapy:  Group Therapy  Participation Level:  Minimal  Participation Quality: Minimal  Affect:   Flat, Depressed  Cognitive:   Appropriate  Insight: Developing/Improving   Engagement in Therapy: Developing/Imprvoing  Modes of Intervention:  Discussion Exploration  Education Rapport BuildingProblem-Solving Support  Summary of Progress/Problems:  The main focus of today's group was overcoming obstacles.  Patient remained in group but did not engage in discussion.      [name] 04/13/2014

## 2014-04-13 NOTE — BHH Suicide Risk Assessment (Signed)
[facility] Admission Suicide Risk Assessment   Nursing information obtained from:  Patient Demographic factors:  Caucasian, Low socioeconomic status Current Mental Status:  Suicidal ideation indicated by patient, Suicide plan, Self-harm thoughts, Belief that plan would result in death Loss Factors:  Financial problems / change in socioeconomic status Historical Factors:  Prior suicide attempts, Family history of mental illness or substance abuse Risk Reduction Factors:  Employed, Living with another person, especially a relative Total Time spent with patient: 45 minutes Principal Problem: Bipolar Disorder Mixed  Diagnosis:   Patient Active Problem List   Diagnosis Date Noted  . Bipolar affective disorder, depressed, severe [F31.4] 04/12/2014  . Suicidal ideation [R45.851] 04/12/2014  . Injury of right shoulder and upper arm [S49.91XA] 02/17/2014  . Left leg pain [M79.605] 06/03/2013  . Right hip pain [M25.551] 06/03/2013  . Migraine with status migrainosus [G43.901] 01/08/2013  . Personality disorder [F60.9]   . Chronic migraine [G43.709] 05/08/2012  . Contact dermatitis [L25.9] 11/27/2011  . Major depressive disorder, recurrent episode [F33.9] 10/27/2011  . Generalized anxiety disorder [F41.1] 10/27/2011  . 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 Borderline personality disorder [F60.3] 10/27/2011  . Right foot pain [M79.671] 09/28/2011  . 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 Ganglion cyst [M67.40] 09/29/2009  . 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 BIPOLAR DISORDER UNSPECIFIED [F31.9] 06/09/2008     Continued Clinical Symptoms:  Alcohol Use Disorder Identification Test Final Score (AUDIT): 0 The "Alcohol Use Disorders Identification Test", Guidelines for Use in Primary Care, Second Edition.  World Pharmacologist [facility]). Score between 0-7:  no or low risk or alcohol  related problems. Score between 8-15:  moderate risk of alcohol related problems. Score between 16-19:  high risk of alcohol related problems. Score 20 or above:  warrants further diagnostic evaluation for alcohol dependence and treatment.   CLINICAL FACTORS:  44 year old female, long history of mental illness, has been diagnosed with Bipolar Disorder. Presents with depression, irritability, racing thoughts, suicidal ideations, suggestive of mixed episode .    Musculoskeletal: Strength & Muscle Tone: within normal limits Gait & Station: normal Patient leans: N/A  Psychiatric Specialty Exam: Physical Exam  ROS  Blood pressure 111/77, pulse 83, temperature 97.5 F (36.4 C), temperature source Oral, resp. rate 18, height 5\' 9"  (1.753 m), weight 233 lb (105.688 kg), last menstrual period 03/29/2014.Body mass index is 34.39 kg/(m^2).  SEE ADMIT NOTE MSE   COGNITIVE FEATURES THAT CONTRIBUTE TO RISK:  Closed-mindedness    SUICIDE RISK:   Moderate:  Frequent suicidal ideation with limited intensity, and duration, some specificity in terms of plans, no associated intent, good self-control, limited dysphoria/symptomatology, some risk factors present, and identifiable protective factors, including available and accessible social support.  PLAN OF CARE: Patient will be admitted to inpatient psychiatric unit for stabilization and safety. Will provide and encourage milieu participation. Provide medication management and maked adjustments as needed.  Will follow daily.    Medical Decision Making:  Review of Psycho-Social Stressors (1), Review or order clinical lab tests (1), Established Problem, Worsening (2) and Review of Medication Regimen & Side Effects (2)  I certify that inpatient services furnished can reasonably be expected to improve the patient's condition.   Lourdes Manning, [place] 04/13/2014, 6:11 PM

## 2014-04-13 NOTE — Progress Notes (Signed)
D: Patient severely anxious and reports racing thoughts.  She states, "I feel like throwing a chair at the wall.  I feel like screaming at you."  Patient was given 50 mg vistaril for anxiety.  Suggested patient go to her room and lie down and do some imagery with her favorite place.  Her goal for today is to "not to feel like I'm literally coming out of my skin with anger, anxiety, stress and fear.  Patient has been tearful and depressed.  She rates her depression as an 8; hopelessness as a 7; anxiety as a 9. A: Continue to monitor medication management and MD orders.  Safety checks completed every 15 minutes per protocol.  Meet 1:1 with patient to address concerns and offer encouragement. R: Patient is redirectable at this time.

## 2014-04-13 NOTE — BHH Group Notes (Signed)
[facility] LCSW Aftercare Discharge Planning Group Note   04/13/2014 9:34 AM    Participation Quality:  Appropraite  Mood/Affect:  Appropriate  Depression Rating:  9  Anxiety Rating:  10  Thoughts of Suicide:  No  Will you contract for safety?   NA  Current AVH:  No  Plan for Discharge/Comments:  Patient attended discharge planning group and actively participated in group. Patient reports admitting due to [address].  She reports having home and outpatient provides.  Suicide prevention education reviewed and SPE document provided.   Transportation Means: Patient has transportation.   Supports:  Patient has a support system.   Euel Castile, [place]

## 2014-04-13 NOTE — Progress Notes (Signed)
Recreation Therapy Notes  Date: 03.14.2016 Time: 9:30am Location: [address] Group Room   Group Topic: Stress Management  Goal Area(s) Addresses:  Patient will actively participate in stress management techniques presented during session.   Behavioral Response: Did not attend.   Laureen Ochs Blanche Gallien, LRT/CTRS  Lane Hacker 04/13/2014 2:33 PM

## 2014-04-13 NOTE — Progress Notes (Signed)
Patient ID: [name], female   DOB: 1970-09-19, 44 y.o.   MRN: [id number] Observation note: Pt anxious and pacing hallway. Pt stated that she is having racing thought and feels like banging her head on the walls or floors. Pt unable to contract for safety. Writer spoke with Dr. Parke Poisson and made him aware of pt complaint. Pt stated that the racing thoughts are getting worst and that she feels restless. Pt stated that there are several ways that she can hurt herself here by banging her head and that there's nothing staff can do to prevent this from happening. Pt placed on 1:1 observation for safety. Pt taken to the quiet room at pt's request to try and relax. Pt safely sitting in quiet room at this time with sitter. Pt safety maintained.

## 2014-04-13 NOTE — Progress Notes (Signed)
Psychoeducational Group Note  Date:  04/13/2014 Time:  2244  Group Topic/Focus:  Wrap-Up Group:   The focus of this group is to help patients review their daily goal of treatment and discuss progress on daily workbooks.  Participation Level: Did Not Attend  Participation Quality:  Not Applicable  Affect:  Not Applicable  Cognitive:  Not Applicable  Insight:  Not Applicable  Engagement in Group: Not Applicable  Additional Comments:  The patient did not attend group this evening since she was asleep in her room.   Archie Balboa S 04/13/2014, 10:44 PM

## 2014-04-14 LAB — VALPROIC ACID LEVEL: Valproic Acid Lvl: 63.8 ug/mL (ref 50.0–100.0)

## 2014-04-14 LAB — LIPID PANEL
Cholesterol: 165 mg/dL (ref 0–200)
HDL: 55 mg/dL (ref >39)
LDL Cholesterol: 90 mg/dL (ref 0–99)
Total CHOL/HDL Ratio: 3 RATIO
Triglycerides: 99 mg/dL (ref <150)
VLDL: 20 mg/dL (ref 0–40)

## 2014-04-14 LAB — TSH: TSH: 1.277 u[IU]/mL (ref 0.350–4.500)

## 2014-04-14 MED ORDER — DIVALPROEX SODIUM ER 250 MG PO TB24
250.0000 mg | ORAL_TABLET | Freq: Every day | ORAL | Status: DC
  Administered 2014-04-14 – 2014-04-16 (×3): 250 mg via ORAL
  Filled 2014-04-14 (×4): qty 1

## 2014-04-14 MED ORDER — LORAZEPAM 0.5 MG PO TABS
0.5000 mg | ORAL_TABLET | Freq: Four times a day (QID) | ORAL | Status: DC
  Administered 2014-04-14 – 2014-04-15 (×6): 0.5 mg via ORAL
  Filled 2014-04-14 (×6): qty 1

## 2014-04-14 MED ORDER — SUMATRIPTAN SUCCINATE 6 MG/0.5ML ~~LOC~~ SOLN
6.0000 mg | Freq: Once | SUBCUTANEOUS | Status: AC
  Administered 2014-04-14: 6 mg via SUBCUTANEOUS
  Filled 2014-04-14 (×2): qty 0.5

## 2014-04-14 NOTE — Tx Team (Signed)
Interdisciplinary Treatment Plan Update   Date Reviewed:  04/14/2014  Time Reviewed:  8:53 AM  Progress in Treatment:   Attending groups: Yes, patient is attending groups. Participating in groups: Yes, engages in group discussion. Taking medication as prescribed: Yes  Tolerating medication: Yes Family/Significant other contact made:  No, but will ask patient for consent for collateral contact Patient understands diagnosis: Yes, patient understands diagnosis and need for treatment. Discussing patient identified problems/goals with staff: Yes, patient is able to express goals for treatment and discharge. Medical problems stabilized or resolved: Yes Denies suicidal/homicidal ideation: Yes Patient has not harmed self or others: Yes  For review of initial/current patient goals, please see plan of care.  Estimated Length of Stay:  4-5 days  Reasons for Continued Hospitalization:  Anxiety Depression Medication stabilization   New Problems/Goals identified:    Discharge Plan or Barriers:   Home with outpatient follow up with Letta Moynahan and Clint Bolder at [place]  Additional Comments:   Patient is a 44 year old female. She reports a long psychiatric history and states she has been diagnosed with Bipolar Disorder and Borderline Personality Disorder in the past. She states " I have been admitted to psychiatric hospitals many times , but had not needed to come in since 2013.". She presented to ED accompanied by mother, reporting depression, anxiety, and suicidal ideations, with different thoughts including jumping from a height. She also reports a subjective sense Of racing thoughts, increased energy level , and irritability. She states job and disability issues have been one contributing stressor.  Patient and CSW reviewed patient's identified goals and treatment plan.  Patient verbalized understanding and agreed to treatment plan.   Attendees:  Patient:  04/14/2014 8:53 AM    Signature:  Gabriel Earing, MD 04/14/2014 8:53 AM  Signature: Carlton Adam, MD 04/14/2014 8:53 AM  Signature:  Eduard Roux, RN 04/14/2014 8:53 AM  Signature:  Darrol Angel, RN 04/14/2014 8:53 AM  Signature:  Mayra Neer, RN 04/14/2014 8:53 AM  Signature:  Joette Catching, LCSW 04/14/2014 8:53 AM  Signature:  Erasmo Downer Drinkard, LCSW-A 04/14/2014 8:53 AM  Signature:   04/14/2014 8:53 AM  Signature:   04/14/2014 8:53 AM  Signature:  04/14/2014  8:53 AM  Signature:   Lars Pinks, RN [facility] 04/14/2014  8:53 AM  Signature:   04/14/2014  8:53 AM    Scribe for Treatment Team:   Joette Catching,  04/14/2014 8:53 AM

## 2014-04-14 NOTE — Progress Notes (Signed)
Observation note: Pt resting in bed at this time. Sitter present at bedside. Pt c/o migraine and received Imitrex sub Q. Pt continues to endorse suicidal thoughts. Pt verbally contracts safety. Pt continues to have thoughts to bang her head on the walls and floors. Pt reports racing thoughts and feeling restless. Pt remains on 1:1 observation for safety. Pt safety maintained at this time.

## 2014-04-14 NOTE — BHH Group Notes (Signed)
[facility] Group Notes:  (Nursing/MHT/Case Management/Adjunct)  Date:  04/14/2014  Time:  0900   Type of Therapy:  Nurse Education  Participation Level:  Did Not Attend  Participation Quality:    Affect:    Cognitive:    Insight:    Engagement in Group:    Modes of Intervention:    Summary of Progress/Problems:  Marissa Calamity 04/14/2014, 1:42 PM

## 2014-04-14 NOTE — Progress Notes (Signed)
Patient ID: [name], female   DOB: [birth year], 44 y.o.   MRN: [id number] D: client in bed this evening, eyes closed respirations even. A: Writer observed for s/s of distress. Staff will maintain 1:1 for safety. R: client is safe on the unit, no distress noted. Client  did not attend group.

## 2014-04-14 NOTE — Progress Notes (Signed)
1:1 observation note: Patient has been calm and cooperative.  She remains passively suicidal; she contracts for safety.  Patient states, "I still feel like banging my head against the wall.  I don't know what is wrong with me."  She took a nap before dinner, got up and took her scheduled medications and ate dinner.  Continue 1:1 due to safety.

## 2014-04-14 NOTE — Progress Notes (Signed)
Observation note: Pt observed in bed resting with eyes closed. Pt do not appear to be in distress. Resp are even and unlabored. Pt sitter present at bedside. During assessment this morning patient stated that she endorses suicidal thoughts. Pt verbally contracts not to harm self. Pt stated that she continues to have thoughts of wanting to bang her head against the walls and floors. Pt verbalized that she would like to be started on her home meds and if not, she would like to be discharged home. Pt requesting  Imitrex IM, Adderall, vitamin A and B12. Pt anxious on approach and labile. Pt was speaking inappropriately about roommate and can be manipulative with staff. Pt remains on 1:1 observation for safety.

## 2014-04-14 NOTE — Progress Notes (Signed)
Patient ID: [name], female   DOB: Jul 15, 1970, 44 y.o.   MRN: [id number] D: Client in bed eyes closed, respirations even. A: Client will be monitored 1:1 for safety. R: Client is safe on the unit.

## 2014-04-14 NOTE — BHH Group Notes (Signed)
[facility] LCSW Group Therapy      Feelings About Diagnosis 1:15 - 2:30 PM         04/14/2014 2:56 PM   Type of Therapy:  Group Therapy  Participation Level:  Patient up and in hall.  She stated she would not be attending group.  [name] 04/14/2014  2:56 PM

## 2014-04-14 NOTE — Progress Notes (Signed)
Patient ID: [name], female   DOB: 11/09/1970, 44 y.o.   MRN: [id number] D: Client up for labs, reports slept "okay" A: Writer encouraged her to consider being up today to help sleep better tonight. Staff will maintain safety with 1:1. R: Client is safe on unit.

## 2014-04-14 NOTE — Progress Notes (Addendum)
[facility] MD Progress Note  04/14/2014 6:48 PM [name]  MRN:  [id number] Subjective:  Patient states she is still feeling subjectively agitated, irritable, and " like I want to bang my head against the walls ".  Objective : I have discussed case with treatment team and have met with patient. Patient remains vaguely irritable and dysphoric. As discussed with staff, although she has not made active suicidal statements, she has made statements such as " there are many ways a person could hurt themselves ", and " I want to bang my head against the wall over and over again". Based on symptoms and presentation, staff and writer have decided to continue one to one observation for safety at this time. She continues to report symptoms such as racing thoughts, irritability, but a sense of depression and SI as above as well. She did respond well to support, empathy, and explanation of her symptoms as an exacerbation of her underlying mood disorder. Patient also initially angry , requesting to be restarted on amphetamine medication for ADHD. Responsive to explanation regarding how stimulant could negatively affect mood, sleep, mixed/hypomanic symptoms at this time. No agitated or overtly disruptive behaviors on unit. Visible on unit, but milieu participation limited at this time. Currently not endorsing medication side effects. Labs reviewed as below . Valproic Acid level 63.8 , TSH WNL  Principal Problem:Bipolar Disorder Mixed  Diagnosis:   Patient Active Problem List   Diagnosis Date Noted  . Bipolar affective disorder, depressed, severe [F31.4] 04/12/2014  . Suicidal ideation [R45.851] 04/12/2014  . Injury of right shoulder and upper arm [S49.91XA] 02/17/2014  . Left leg pain [M79.605] 06/03/2013  . Right hip pain [M25.551] 06/03/2013  . Migraine with status migrainosus [G43.901] 01/08/2013  . Personality disorder [F60.9]   . Chronic migraine [G43.709] 05/08/2012  . Contact dermatitis [L25.9]  11/27/2011  . Major depressive disorder, recurrent episode [F33.9] 10/27/2011  . Generalized anxiety disorder [F41.1] 10/27/2011  . 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 Borderline personality disorder [F60.3] 10/27/2011  . Right foot pain [M79.671] 09/28/2011  . 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 Ganglion cyst [M67.40] 09/29/2009  . 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 BIPOLAR DISORDER UNSPECIFIED [F31.9] 06/09/2008   Total Time spent with patient: 25 minutes    Past Medical History:  Past Medical History  Diagnosis Date  . Bipolar disorder   . Eczema   . Pes planus   . Achilles tendinitis   . Hypertension   . Migraine   . Achilles tendinitis   . Ganglion cyst 09/29/2009    left wrist (2 cyst)  . Arthritis   . Allergy   . Lipoma   . ADHD (attention deficit hyperactivity disorder)   . Hyperprolactinemia   . Bipolar affective   . Personality disorder     Past Surgical History  Procedure Laterality Date  . Tumor resection left thigh    . Ankle surgery  12/88    left   . Lipoma removal    . Ganglion cyst excision  2011  . Chest nodule  1990?    rt chest wall nodule removal  . Shoulder surgery  01/13/2011    right, partial tear  . Right bunioectomy     Family History:  Family History  Problem Relation Age of Onset  . Hypertension Mother   . Hyperlipidemia Mother   . Heart attack Father   . Heart  disease Father   . Hypertension Father   . Diabetes Paternal Grandfather   . Heart disease Maternal Aunt   . Breast cancer Maternal Aunt   . Heart disease Maternal Grandmother   . Cancer Maternal Grandmother     colon   Social History:  History  Alcohol Use No     History  Drug Use No    History   Social History  . Marital Status: Single    Spouse Name: N/A  . Number of Children: 0  . Years of Education: N/A   Occupational History  .   [employer]   Social History Main Topics  . Smoking status: Never Smoker   . Smokeless tobacco: Never Used  . Alcohol Use: No  . Drug Use: No  . Sexual Activity: Yes    Birth Control/ Protection: Pill   Other Topics Concern  . None   Social History Narrative   Additional History:    Sleep: improved   Appetite:  Fair   Assessment:   Musculoskeletal: Strength & Muscle Tone: within normal limits Gait & Station: normal Patient leans: N/A   Psychiatric Specialty Exam: Physical Exam  Review of Systems  Constitutional: Negative for fever and chills.  Respiratory: Negative for cough and shortness of breath.   Cardiovascular: Negative for chest pain.  Gastrointestinal: Negative for vomiting.  Skin: Negative for rash.  Neurological: Positive for headaches. Negative for seizures and loss of consciousness.  Psychiatric/Behavioral: Positive for depression and suicidal ideas. The patient is nervous/anxious.     Blood pressure 114/82, pulse 82, temperature 97.6 F (36.4 C), temperature source Oral, resp. rate 20, height $RemoveBe'5\' 9"'MvMFtYknJ$  (1.753 m), weight 233 lb (105.688 kg), last menstrual period 03/29/2014.Body mass index is 34.39 kg/(m^2).  General Appearance: Fairly Groomed  Engineer, water::  Fair  Speech:  Normal Rate- not pressured at this time  Volume:  Normal  Mood:  Depressed and Dysphoric  Affect:  still constricted and irritable  Thought Process:  Goal Directed and Linear- no flight of ideations  Orientation:  Full (Time, Place, and Person)  Thought Content:  denies hallucinations, no delusions expressed, no grandiose ideations  Suicidal Thoughts:  Yes.  with intent/plan- as above, expressing thoughts of hitting head against the wall, suicidal ruminations  Homicidal Thoughts:  No  Memory:  Recent and remote grossly intact   Judgement:  Fair  Insight:  Fair  Psychomotor Activity:  Normal- not psychomotorically restless at this time   Concentration:  Fair  Recall:  [employer] of  Knowledge:Good  Language: Good  Akathisia:  Negative  Handed:  Right  AIMS (if indicated):     Assets:  Desire for Improvement Resilience Vocational/Educational  ADL's: currently impaired   Cognition: WNL  Sleep:  Number of Hours: 6     Current Medications: Current Facility-Administered Medications  Medication Dose Route Frequency Provider Last Rate Last Dose  . acetaminophen (TYLENOL) tablet 650 mg  650 mg Oral Q6H PRN Patrecia Pour, NP   650 mg at 04/14/14 1694  . alum & mag hydroxide-simeth (MAALOX/MYLANTA) 200-200-20 MG/5ML suspension 30 mL  30 mL Oral Q4H PRN Patrecia Pour, NP      . divalproex (DEPAKOTE ER) 24 hr tablet 1,000 mg  1,000 mg Oral QHS Patrecia Pour, NP   1,000 mg at 04/13/14 2129  . divalproex (DEPAKOTE ER) 24 hr tablet 250 mg  250 mg Oral QHS Myer Peer Cobos, MD      . hydrochlorothiazide (HYDRODIURIL) tablet 25  mg  25 mg Oral Daily Patrecia Pour, NP   25 mg at 04/14/14 3903  . lamoTRIgine (LAMICTAL) tablet 200 mg  200 mg Oral QHS Patrecia Pour, NP   200 mg at 04/13/14 2137  . LORazepam (ATIVAN) tablet 0.5 mg  0.5 mg Oral Q6H Myer Peer Cobos, MD   0.5 mg at 04/14/14 1812  . lurasidone (LATUDA) tablet 80 mg  80 mg Oral q1800 Patrecia Pour, NP   80 mg at 04/14/14 1812  . magnesium hydroxide (MILK OF MAGNESIA) suspension 30 mL  30 mL Oral Daily PRN Patrecia Pour, NP      . metoprolol succinate (TOPROL-XL) 24 hr tablet 100 mg  100 mg Oral Daily Patrecia Pour, NP   100 mg at 04/14/14 0092  . multivitamin with minerals tablet 1 tablet  1 tablet Oral Daily Patrecia Pour, NP   1 tablet at 04/14/14 3300  . zolpidem (AMBIEN) tablet 5 mg  5 mg Oral QHS PRN Patrecia Pour, NP   5 mg at 04/13/14 2135    Lab Results:  Results for orders placed or performed during the hospital encounter of 04/12/14 (from the past 48 hour(s))  Valproic acid level     Status: None   Collection Time: 04/14/14  6:30 AM  Result Value Ref Range   Valproic Acid Lvl 63.8 50.0 - 100.0 ug/mL     Comment: Performed at [facility]  Lipid panel     Status: None   Collection Time: 04/14/14  6:30 AM  Result Value Ref Range   Cholesterol 165 0 - 200 mg/dL   Triglycerides 99 <150 mg/dL   HDL 55 >39 mg/dL   Total CHOL/HDL Ratio 3.0 RATIO   VLDL 20 0 - 40 mg/dL   LDL Cholesterol 90 0 - 99 mg/dL    Comment:        Total Cholesterol/HDL:CHD Risk Coronary Heart Disease Risk Table                     Men   Women  1/2 Average Risk   3.4   3.3  Average Risk       5.0   4.4  2 X Average Risk   9.6   7.1  3 X Average Risk  23.4   11.0        Use the calculated Patient Ratio above and the CHD Risk Table to determine the patient's CHD Risk.        ATP III CLASSIFICATION (LDL):  <100     mg/dL   Optimal  100-129  mg/dL   Near or Above                    Optimal  130-159  mg/dL   Borderline  160-189  mg/dL   High  >190     mg/dL   Very High Performed at [facility]   TSH     Status: None   Collection Time: 04/14/14  6:30 AM  Result Value Ref Range   TSH 1.277 0.350 - 4.500 uIU/mL    Comment: Performed at [facility]    Physical Findings: AIMS: Facial and Oral Movements Muscles of Facial Expression: None, normal Lips and Perioral Area: None, normal Jaw: None, normal Tongue: None, normal,Extremity Movements Upper (arms, wrists, hands, fingers): None, normal Lower (legs, knees, ankles, toes): None, normal, Trunk Movements Neck, shoulders, hips: None, normal, Overall Severity Severity  of abnormal movements (highest score from questions above): None, normal Incapacitation due to abnormal movements: None, normal Patient's awareness of abnormal movements (rate only patient's report): No Awareness, Dental Status Current problems with teeth and/or dentures?: No Does patient usually wear dentures?: No  CIWA:    COWS:      Assessment- at this time patient still quite symptomatic, with depression, irritability, dysphoria, and suicidal  ruminations. Tolerating medications well. Valproic Acid serum level on lower spectrum of therapeutic.  Treatment Plan Summary: Daily contact with patient to assess and evaluate symptoms and progress in treatment, Medication management, Plan continue inpatient treatment and continue medications as below  Increase Depakote ER to 1250 mgrs QHS  Continue Lamictal 200 mgrs QDAY Continue Latuda 80 mgrs QDAY  Start Ativan 0.5 mgrs Q 6 hours to help address mood disorder, anxiety We have reviewed medication side effects, to include potential valproic acid side effects and potential for severe rashes on Lamictal. Continue 1:1 observation level for safety.  Medical Decision Making:  Established Problem, Stable/Improving (1), Review of Psycho-Social Stressors (1), Review or order clinical lab tests (1), Review of Last Therapy Session (1) and Review of New Medication or Change in Dosage (2)     COBOS, FERNANDO 04/14/2014, 6:48 PM

## 2014-04-14 NOTE — Progress Notes (Signed)
Recreation Therapy Notes  Animal-Assisted Activity/Therapy (AAA/T) Program Checklist/Progress Notes Patient Eligibility Criteria Checklist & Daily Group note for Rec Tx Intervention  Date: 03.16.2016 Time: 2:45pm Location: [address]   AAA/T Program Assumption of Risk Form signed by Patient/ or Parent Legal Guardian Patient refused animal assisted activities at admission.   Behavioral Response: Did not attend.   Laureen Ochs Kamry Faraci, LRT/CTRS  Mikahla Wisor L 04/14/2014 5:10 PM

## 2014-04-14 NOTE — Progress Notes (Signed)
Patient ID: [name], female   DOB: 1970/07/23, 44 y.o.   MRN: [id number] D: client in room this shift,  argumentative and irritable, reporting that physician said she could be a do not admit also that medications are not what they discussed. A: Writer reviewed medications, administered as ordered. Staff will monitor 1:1 for safety. R:Client is safe on the unit, did not attend group.

## 2014-04-15 LAB — HEMOGLOBIN A1C
Hgb A1c MFr Bld: 5.3 % (ref 4.8–5.6)
Mean Plasma Glucose: 105 mg/dL

## 2014-04-15 MED ORDER — ONDANSETRON 4 MG PO TBDP
4.0000 mg | ORAL_TABLET | Freq: Three times a day (TID) | ORAL | Status: DC | PRN
  Administered 2014-04-15 – 2014-04-22 (×2): 4 mg via ORAL
  Filled 2014-04-15 (×2): qty 1

## 2014-04-15 MED ORDER — LORAZEPAM 0.5 MG PO TABS
0.5000 mg | ORAL_TABLET | Freq: Three times a day (TID) | ORAL | Status: DC
  Administered 2014-04-16 – 2014-04-17 (×4): 0.5 mg via ORAL
  Filled 2014-04-15 (×4): qty 1

## 2014-04-15 NOTE — BHH Group Notes (Signed)
[facility] LCSW Aftercare Discharge Planning Group Note   04/15/2014 9:27 AM    Participation Quality:  Appropraite  Mood/Affect:  Appropriate  Depression Rating:  7  Anxiety Rating:  8  Thoughts of Suicide:  Yes  Will you contract for safety?  No  Current AVH:  No  Plan for Discharge/Comments:  Patient attended discharge planning group and actively participated in group. Patient continues to endorse SI and states she is unable to contract for safety.  She will follow up with Dr. Caprice Beaver and Floria Raveling for counselig. Suicide prevention education reviewed and SPE document provided.   Transportation Means: Patient has transportation.   Supports:  Patient has a support system.   Jalayia Bagheri, Eulas Post

## 2014-04-15 NOTE — BHH Group Notes (Signed)
[facility] LCSW Group Therapy  Emotional Regulation 1:15 - 2: 30 PM        04/15/2014     Type of Therapy:  Group Therapy  Participation Level:  Appropriate  Participation Quality:  Appropriate  Affect:  Depressed, Flat  Cognitive:   Appropriate  Insight:  Developing/Improving   Engagement in Therapy:  Developing/Improving   Modes of Intervention:  Discussion Exploration Problem-Solving Supportive  Summary of Progress/Problems:  Group topic was emotional regulations.  Patient participated in the discussion and was able to identify an emotion that needed to regulated.  She shared anger is a problem for her.  She talked about the many hospitalizations she has had and finding herself back in the hospital again.  Patient shared she feels like throwing a chair through the window.  Patient was able to process feelings and understand throwing a chair would not change her situation but make matters worse.    [name] 04/15/2014

## 2014-04-15 NOTE — Progress Notes (Signed)
Patient ID: [name], female   DOB: 04-Apr-1970, 44 y.o.   MRN: [id number] [facility] MD Progress Note  04/15/2014 5:21 PM [name]  MRN:  [id number] Subjective:  Patient continues to describe a sense of agitation, racing thoughts, anxiety, and depression. Objective : I have discussed case with treatment team and have met with patient. Although, as above, she continues to describe ongoing symptoms, she presents with some improvement today. She seems calmer overall, and less visibly distressed and upset. She has still been ruminating about " banging head on wall until I lose consciousness", and makes vague statements such as " There are ways I could hurt myself here if I want to". She states she continues to have suicidal ideations, although at this time denies any clear plan. Nursing staff reports that patient has not been able to contract for safety during interactions with Nurses.  As such, it is felt that she should continue on 1: 1 observation at this time.  At patient's request and with patient's express consent I spoke with Dr. Sheralyn Boatman, her outpatient psychiatrist, with whom patient has a good therapeutic alliance/raport. Dr. Caprice Beaver reports that patient has had periodic decompensations with exacerbation of borderline personality disorder symptoms, primarily during periods of increased stress. She states that current medication regimen has generally been well tolerated and effective, and thus we decided not to replace Latuda for another antipsychotic trial at this time, as we had been considering to do. Dr. Caprice Beaver agrees with Ativan for short period of time to decrease agitation and restlessness, and with Depakote titration as tolerated. She will follow patient after discharge. Patient does respond to support, empathy, and seems less distressed than yesterday, although as noted, still unable to reliably contract for safety.   Principal Problem:Bipolar Disorder Mixed  Diagnosis:    Patient Active Problem List   Diagnosis Date Noted  . Bipolar affective disorder, depressed, severe [F31.4] 04/12/2014  . Suicidal ideation [R45.851] 04/12/2014  . Injury of right shoulder and upper arm [S49.91XA] 02/17/2014  . Left leg pain [M79.605] 06/03/2013  . Right hip pain [M25.551] 06/03/2013  . Migraine with status migrainosus [G43.901] 01/08/2013  . Personality disorder [F60.9]   . Chronic migraine [G43.709] 05/08/2012  . Contact dermatitis [L25.9] 11/27/2011  . Major depressive disorder, recurrent episode [F33.9] 10/27/2011  . Generalized anxiety disorder [F41.1] 10/27/2011  . 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 Borderline personality disorder [F60.3] 10/27/2011  . Right foot pain [M79.671] 09/28/2011  . 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 Ganglion cyst [M67.40] 09/29/2009  . 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 BIPOLAR DISORDER UNSPECIFIED [F31.9] 06/09/2008   Total Time spent with patient: 25 minutes    Past Medical History:  Past Medical History  Diagnosis Date  . Bipolar disorder   . Eczema   . Pes planus   . Achilles tendinitis   . Hypertension   . Migraine   . Achilles tendinitis   . Ganglion cyst 09/29/2009    left wrist (2 cyst)  . Arthritis   . Allergy   . Lipoma   . ADHD (attention deficit hyperactivity disorder)   . Hyperprolactinemia   . Bipolar affective   . Personality disorder     Past Surgical History  Procedure Laterality Date  . Tumor resection left thigh    . Ankle surgery  12/88    left   . Lipoma removal    .  Ganglion cyst excision  2011  . Chest nodule  1990?    rt chest wall nodule removal  . Shoulder surgery  01/13/2011    right, partial tear  . Right bunioectomy     Family History:  Family History  Problem Relation Age of Onset  . Hypertension Mother   . Hyperlipidemia Mother   . Heart attack Father    . Heart disease Father   . Hypertension Father   . Diabetes Paternal Grandfather   . Heart disease Maternal Aunt   . Breast cancer Maternal Aunt   . Heart disease Maternal Grandmother   . Cancer Maternal Grandmother     colon   Social History:  History  Alcohol Use No     History  Drug Use No    History   Social History  . Marital Status: Single    Spouse Name: N/A  . Number of Children: 0  . 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 Smoking status: Never Smoker   . Smokeless tobacco: Never Used  . Alcohol Use: No  . Drug Use: No  . Sexual Activity: Yes    Birth Control/ Protection: Pill   Other Topics Concern  . None   Social History Narrative   Additional History:    Sleep: improved   Appetite:  Fair   Assessment:   Musculoskeletal: Strength & Muscle Tone: within normal limits Gait & Station: normal Patient leans: N/A   Psychiatric Specialty Exam: Physical Exam  Review of Systems  Constitutional: Negative for fever and chills.  Respiratory: Negative for cough and shortness of breath.   Cardiovascular: Negative for chest pain.  Psychiatric/Behavioral: Positive for depression and suicidal ideas. The patient is nervous/anxious.     Blood pressure 127/85, pulse 72, temperature 97.8 F (36.6 C), temperature source Oral, resp. rate 16, height _0  (1.753 m), weight 233 lb (105.688 kg), last menstrual period 03/29/2014.Body mass index is 34.39 kg/(m^2).  General Appearance: Fairly Groomed  Engineer, water::  Good  Speech:  Normal Rate- not pressured at this time  Volume:  Normal  Mood:  Depressed  Affect:  Constricted and less irritable, does smile briefly at times  Thought Process:  Goal Directed and Linear- no flight of ideations  Orientation:  Full (Time, Place, and Person)  Thought Content:  denies hallucinations, no delusions expressed, no grandiose ideations  Suicidal Thoughts:  Yes.  with  intent/plan- as above, expressing thoughts of hitting head against the wall, suicidal ruminations  Homicidal Thoughts:  No  Memory:  Recent and remote grossly intact   Judgement:  Fair  Insight:  Fair  Psychomotor Activity:  Normal- not psychomotorically restless at this time   Concentration:  Fair  Recall:  [employer] of Knowledge:Good  Language: Good  Akathisia:  Negative  Handed:  Right  AIMS (if indicated):     Assets:  Desire for Improvement Resilience Vocational/Educational  ADL's: currently impaired   Cognition: WNL  Sleep:  Number of Hours: 6     Current Medications: Current Facility-Administered Medications  Medication Dose Route Frequency Provider Last Rate Last Dose  . acetaminophen (TYLENOL) tablet 650 mg  650 mg Oral Q6H PRN Patrecia Pour, NP   650 mg at 04/15/14 0113  . alum & mag hydroxide-simeth (MAALOX/MYLANTA) 200-200-20 MG/5ML suspension 30 mL  30 mL Oral Q4H PRN Patrecia Pour, NP      . divalproex (DEPAKOTE ER)  24 hr tablet 1,000 mg  1,000 mg Oral QHS Patrecia Pour, NP   1,000 mg at 04/14/14 2159  . divalproex (DEPAKOTE ER) 24 hr tablet 250 mg  250 mg Oral QHS Myer Peer Cobos, MD   250 mg at 04/14/14 2200  . hydrochlorothiazide (HYDRODIURIL) tablet 25 mg  25 mg Oral Daily Patrecia Pour, NP   25 mg at 04/15/14 0813  . lamoTRIgine (LAMICTAL) tablet 200 mg  200 mg Oral QHS Patrecia Pour, NP   200 mg at 04/14/14 2200  . LORazepam (ATIVAN) tablet 0.5 mg  0.5 mg Oral Q6H Myer Peer Cobos, MD   0.5 mg at 04/15/14 1256  . lurasidone (LATUDA) tablet 80 mg  80 mg Oral q1800 Patrecia Pour, NP   80 mg at 04/14/14 1812  . magnesium hydroxide (MILK OF MAGNESIA) suspension 30 mL  30 mL Oral Daily PRN Patrecia Pour, NP      . metoprolol succinate (TOPROL-XL) 24 hr tablet 100 mg  100 mg Oral Daily Patrecia Pour, NP   100 mg at 04/15/14 5809  . multivitamin with minerals tablet 1 tablet  1 tablet Oral Daily Patrecia Pour, NP   1 tablet at 04/15/14 [phone]  . ondansetron  (ZOFRAN-ODT) disintegrating tablet 4 mg  4 mg Oral Q8H PRN Kerrie Buffalo, NP   4 mg at 04/15/14 1516  . zolpidem (AMBIEN) tablet 5 mg  5 mg Oral QHS PRN Patrecia Pour, NP   5 mg at 04/14/14 2200    Lab Results:  Results for orders placed or performed during the hospital encounter of 04/12/14 (from the past 48 hour(s))  Valproic acid level     Status: None   Collection Time: 04/14/14  6:30 AM  Result Value Ref Range   Valproic Acid Lvl 63.8 50.0 - 100.0 ug/mL    Comment: Performed at [facility]  Lipid panel     Status: None   Collection Time: 04/14/14  6:30 AM  Result Value Ref Range   Cholesterol 165 0 - 200 mg/dL   Triglycerides 99 <150 mg/dL   HDL 55 >39 mg/dL   Total CHOL/HDL Ratio 3.0 RATIO   VLDL 20 0 - 40 mg/dL   LDL Cholesterol 90 0 - 99 mg/dL    Comment:        Total Cholesterol/HDL:CHD Risk Coronary Heart Disease Risk Table                     Men   Women  1/2 Average Risk   3.4   3.3  Average Risk       5.0   4.4  2 X Average Risk   9.6   7.1  3 X Average Risk  23.4   11.0        Use the calculated Patient Ratio above and the CHD Risk Table to determine the patient's CHD Risk.        ATP III CLASSIFICATION (LDL):  <100     mg/dL   Optimal  100-129  mg/dL   Near or Above                    Optimal  130-159  mg/dL   Borderline  160-189  mg/dL   High  >190     mg/dL   Very High Performed at [facility]   Hemoglobin A1c     Status: None   Collection Time: 04/14/14  6:30 AM  Result Value Ref Range   Hgb A1c MFr Bld 5.3 4.8 - 5.6 %    Comment: (NOTE)         Pre-diabetes: 5.7 - 6.4         Diabetes: >6.4         Glycemic control for adults with diabetes: <7.0    Mean Plasma Glucose 105 mg/dL    Comment: (NOTE) Performed At: [facility] [place], [place] [id number] Lindon Romp MD UY:[id number] Performed at [facility]   TSH     Status: None   Collection Time: 04/14/14  6:30 AM  Result  Value Ref Range   TSH 1.277 0.350 - 4.500 uIU/mL    Comment: Performed at [facility]    Physical Findings: AIMS: Facial and Oral Movements Muscles of Facial Expression: None, normal Lips and Perioral Area: None, normal Jaw: None, normal Tongue: None, normal,Extremity Movements Upper (arms, wrists, hands, fingers): None, normal Lower (legs, knees, ankles, toes): None, normal, Trunk Movements Neck, shoulders, hips: None, normal, Overall Severity Severity of abnormal movements (highest score from questions above): None, normal Incapacitation due to abnormal movements: None, normal Patient's awareness of abnormal movements (rate only patient's report): No Awareness, Dental Status Current problems with teeth and/or dentures?: No Does patient usually wear dentures?: No  CIWA:    COWS:      Assessment-  Patient remains  Depressed, intermittently irritable, unable to contract for safety, stating desire to bang head on wall or other self injurious ideations. However, behavior is in good control, and not as agitated or restless as yesterday. Range of affect is also gradually improving. Dr. Caprice Beaver, outpatient psychiatrist, reports that current medication regimen has been  Most effective, and as such will not make major adjustments at this time.  Treatment Plan Summary: Daily contact with patient to assess and evaluate symptoms and progress in treatment, Medication management, Plan continue inpatient treatment and continue medications as below  Continue Depakote ER to 1250 mgrs QHS  Continue Lamictal 200 mgrs QDAY Continue Latuda 80 mgrs QDAY  Continue  Ativan 0.5 mgrs Q 8 hours for anxiety .  Continue 1:1 observation level for safety.  Medical Decision Making:  Established Problem, Stable/Improving (1), Review of Psycho-Social Stressors (1), Review or order clinical lab tests (1), Review of Last Therapy Session (1) and Review of New Medication or Change in Dosage  (2)     COBOS, FERNANDO 04/15/2014, 5:21 PM

## 2014-04-15 NOTE — Progress Notes (Signed)
Pt did not attend group this evening.  

## 2014-04-15 NOTE — [facility] Suicide Risk Assessment (Signed)
[place] INPATIENT:  Family/Significant Other Suicide Prevention Education  Suicide Prevention Education:  Education Completed; Tenessa Marsee, Mother, [phone] been identified by the patient as the family member/significant other with whom the patient will be residing, and identified as the person(s) who will aid the patient in the event of a mental health crisis (suicidal ideations/suicide attempt).  With written consent from the patient, the family member/significant other has been provided the following suicide prevention education, prior to the and/or following the discharge of the patient.  The suicide prevention education provided includes the following:  Suicide risk factors  Suicide prevention and interventions  National Suicide Hotline telephone number  [facility] assessment telephone number  [facility] Emergency Assistance [place] and/or Residential Mobile Crisis Unit telephone number  Request made of family/significant other to:  Remove weapons (e.g., guns, rifles, knives), all items previously/currently identified as safety concern.   Mother advised patient does not have access to weapons.     Remove drugs/medications (over-the-counter, prescriptions, illicit drugs), all items previously/currently identified as a safety concern.  The family member/significant other verbalizes understanding of the suicide prevention education information provided.  The family member/significant other agrees to remove the items of safety concern listed above.  [name] 04/15/2014, 8:28 AM

## 2014-04-15 NOTE — Progress Notes (Signed)
Observation note: Pt observed in her room resting with sitter present. Pt do not appear to be in distress. Pt continues to have racing thoughts. Pt endorses suicidal thoughts and unable to contract for safety. Pt continues to have thoughts of banging her head but have not acted out on those thoughts.

## 2014-04-15 NOTE — Progress Notes (Signed)
Observation note: Pt observed sitting in the group room, with sitter present. Pt actively listening. Pt cooperative and appropriate at this time. Pt remains on 1:1 observation for safety.   During shift assessment pt reported suicidal thoughts. Pt verbally contracted not to harm self and to notify RN if thoughts worsen. Pt reported racing thoughts and thoughts of wanting to bang her head on the walls and floors. Pt rates depression 7/10. Pt requested vitamin C and B12, her home meds. MD made aware of pt request. Pt mood is labile. Writer administered meds as ordered per MD. Verbal support given.

## 2014-04-15 NOTE — Progress Notes (Signed)
D: Patient has been pleasant with staff and smiling when talking in a general conversation. Still has some passive SI but not talking about it and doesn't seem irritated. Praised for coming into the hospital when she needs to . No attempts to harm self since shift has began. A: Staff will continue on 1:1 monitoring for safety for self harm thoughts. R: Cooperative with 1:1 monitoring and took all meds without difficulty

## 2014-04-15 NOTE — Progress Notes (Signed)
Recreation Therapy Notes  Date: 03.16.2016 Time: 9:30am  Location: [address] Group Room   Group Topic: Stress Management  Goal Area(s) Addresses:  Patient will actively participate in stress management techniques presented during session.   Behavioral Response: Engaged, Attentive  Intervention: Stress Management   Activity :  Patients were asked to color a mandala and listen to soothing music.   Education:  Stress Management, Discharge Planning.   Education Outcome: Acknowledges edcuation  Clinical Observations/Feedback: Patient actively engaged in stress management activity.    Laureen Ochs Alejandra Barna, LRT/CTRS  Lane Hacker 04/15/2014 2:55 PM

## 2014-04-15 NOTE — Progress Notes (Signed)
Observation note: Pt standing in the hallway with sitter present. Pt laughing and talking to other pts and sitter(RN). Pt do not appear to be in distress. Pt continues to endorse suicidal thoughts. Pt continues to have thoughts of banging her head. Pt has not been observed hurting herself.  Pt remains on 1:1 observation for safety.   At 1623 pt c/o chest pain, non radiating. Pt stated that it felt like a elephant sitting on chest. Pt calm and do not appear to be in distress. Pt v/s taken b/p127/85, p 72, 97.8, 100% r 16. EKG completed results NSR.  Shuvon, Rankin, NP made aware. Pt encouraged to drink maalox to rule out reflux. Pt refused Maalox.   AT [place]  Pt reported decreased chest pain and is observed laughing and talking to other pts,

## 2014-04-16 MED ORDER — HYDROXYZINE HCL 50 MG PO TABS
50.0000 mg | ORAL_TABLET | Freq: Once | ORAL | Status: AC
  Administered 2014-04-16: 50 mg via ORAL
  Filled 2014-04-16 (×2): qty 1

## 2014-04-16 MED ORDER — SUMATRIPTAN SUCCINATE 6 MG/0.5ML ~~LOC~~ SOLN
6.0000 mg | Freq: Once | SUBCUTANEOUS | Status: AC
  Administered 2014-04-16: 6 mg via SUBCUTANEOUS
  Filled 2014-04-16 (×2): qty 0.5

## 2014-04-16 NOTE — Progress Notes (Signed)
Observation note: Pt observed sitting in the room with sitter present at bedside. Pt requested med list to understand medication schedule. Writer provided pt with med list. Pt continues to endorse suicidal thoughts. Pt verbally contracts for safety. Pt is hopeful that she will be able to come off the one to one observation tomorrow as discussed with MD. Pt remains on 1:1  Observation for safety until d/c'd.

## 2014-04-16 NOTE — Progress Notes (Signed)
D: Patient sitting in dayroom since start of shift talking to another peer. Did have a visitor earlier also. She reports less racing thoughts and no talk about banging head thus far this shift. Patient focused on getting more sleep tonight than last night. Patient took an [place] after getting her regular medications.  A: Staff will monitor on 1:1 for safety due to self harm thoughts. R: Took meds as ordered. Cooperative with 1:1 monitoring.

## 2014-04-16 NOTE — Progress Notes (Signed)
D: Patient was not able to go back to sleep after taking tylenol. Began making threatening statements to sitter that she felt like banging her head and frustration over not being able to sleep. Practitioner notified and patient given Vistaril 50mg  at this time. Patient reports that racing thoughts. Denies that she is hearing voices. A: Staff will continue on 1:1 monitoring for safety due to self harm thoughts. R: Cooperative at present but attention seeking

## 2014-04-16 NOTE — Progress Notes (Signed)
Recreation Therapy Notes  Animal-Assisted Activity/Therapy (AAA/T) Program Checklist/Progress Notes Patient Eligibility Criteria Checklist & Daily Group note for Rec Tx Intervention  Date: 03.17.2016 Time: 2:45pm Location: [address]   AAA/T Program Assumption of Risk Form signed by Patient/ or Parent Legal Guardian yes  Patient is free of allergies or sever asthma yes  Patient reports no fear of animals yes  Patient reports no history of cruelty to animals yes  Patient understands his/her participation is voluntary yes  Patient washes hands before animal contact yes  Patient washes hands after animal contact yes  Behavioral Response: Attentive, Appropriate   Education: Hand Washing, Appropriate Animal Interaction   Education Outcome: Acknowledges education.   Clinical Observations/Feedback: Patient interacted appropriately with dog team and peers during session.   Laureen Ochs Tennelle Taflinger, LRT/CTRS  Lane Hacker 04/16/2014 5:21 PM

## 2014-04-16 NOTE — Progress Notes (Signed)
D: Patient went back to sleep a little while after Vistaril given. Sleeping at 6:00am. No attempts to harm self during the night A: Staff will monitor on 1:1 for safety due to self harm R: Cooperative with staff at present.

## 2014-04-16 NOTE — Progress Notes (Signed)
Observation note: Pt observed lying in bed, resting, talking to sitter. Sitter present at bedside. Pt continues to endorse suicidal thoughts. Pt continues to have thoughts of wanting to bang her head against the walls and floors. Pt continues to have racing thoughts. Pt verbalized that she felt like crying because she is overwhelmed by having these thoughts. Pt remains on 1:1 observation for safety. Pt safety maintained.

## 2014-04-16 NOTE — Progress Notes (Signed)
Observation note: Pt observed lying in bed resting with eyes closed. Pt do not appear to be in distress. Pt resp even and unlabored. Pt sitter present and at pt bedside.   During shift assessment pt mood has been inconsistent. Pt continues to have racing thoughts, suicidal thoughts and thoughts to throw a chair or bang head. Pt reported that she had poor sleep last night and had a boost of energy at 3 am and was up pacing. Pt reported that at that time she was having increased thoughts to bang her head. Pt continues to threatened to harm herself and verbalizes that there is nothing that staff can do to prevent this from happening.

## 2014-04-16 NOTE — Progress Notes (Signed)
Patient ID: [name], female   DOB: 01-Jun-1970, 44 y.o.   MRN: [id number] [facility] MD Progress Note  04/16/2014 6:42 PM [name]  MRN:  [id number] Subjective:   Patient reports an ongoing sense of depression. She states she does feel less irritable and less agitated. She states she is " having a bad migraine again today". She states she still has self injurious thoughts , particularly an idea of banging head against wall until unconscious, and continues to be unable to contract for safety.  She denies medication side effects.  Objective : I have discussed case with treatment team and have met with patient. Patient presents with some improvement compared to admission. In particular she is less irritable, less dysphoric. She does remain depressed, and became tearful during session, particularly when discussing work related issues, expressing a sense of sadness she cannot  Maintain  Or increase work schedule due to her mental illness and difficulty dealing with stressors. We reviewed Dr. Arvil Persons recommendation not to make major mediation changes, based on history of generally good response and tolerance to current medications- patient seemed satisified with this and states she thinks current medication changes ( Ativan and slight Depakote dose increase ) are starting to work. Denies any medication side effects at present. Has been going to some groups, states she particularly enjoyed pet therapy session.  Principal Problem:Bipolar Disorder Mixed  Diagnosis:   Patient Active Problem List   Diagnosis Date Noted  . Bipolar affective disorder, depressed, severe [F31.4] 04/12/2014  . Suicidal ideation [R45.851] 04/12/2014  . Injury of right shoulder and upper arm [S49.91XA] 02/17/2014  . Left leg pain [M79.605] 06/03/2013  . Right hip pain [M25.551] 06/03/2013  . Migraine with status migrainosus [G43.901] 01/08/2013  . Personality disorder [F60.9]   . Chronic migraine [G43.709] 05/08/2012   . Contact dermatitis [L25.9] 11/27/2011  . Major depressive disorder, recurrent episode [F33.9] 10/27/2011  . Generalized anxiety disorder [F41.1] 10/27/2011  . 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 Borderline personality disorder [F60.3] 10/27/2011  . Right foot pain [M79.671] 09/28/2011  . 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 Ganglion cyst [M67.40] 09/29/2009  . 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 BIPOLAR DISORDER UNSPECIFIED [F31.9] 06/09/2008   Total Time spent with patient: 25 minutes    Past Medical History:  Past Medical History  Diagnosis Date  . Bipolar disorder   . Eczema   . Pes planus   . Achilles tendinitis   . Hypertension   . Migraine   . Achilles tendinitis   . Ganglion cyst 09/29/2009    left wrist (2 cyst)  . Arthritis   . Allergy   . Lipoma   . ADHD (attention deficit hyperactivity disorder)   . Hyperprolactinemia   . Bipolar affective   . Personality disorder     Past Surgical History  Procedure Laterality Date  . Tumor resection left thigh    . Ankle surgery  12/88    left   . Lipoma removal    . Ganglion cyst excision  2011  . Chest nodule  1990?    rt chest wall nodule removal  . Shoulder surgery  01/13/2011    right, partial tear  . Right bunioectomy     Family History:  Family History  Problem Relation Age of Onset  . Hypertension Mother   . Hyperlipidemia Mother   . Heart attack Father   .  Heart disease Father   . Hypertension Father   . Diabetes Paternal Grandfather   . Heart disease Maternal Aunt   . Breast cancer Maternal Aunt   . Heart disease Maternal Grandmother   . Cancer Maternal Grandmother     colon   Social History:  History  Alcohol Use No     History  Drug Use No    History   Social History  . Marital Status: Single    Spouse Name: N/A  . Number of Children: 0  . Years of Education: N/A    Occupational History  .  Un[employer]   Social History Main Topics  . Smoking status: Never Smoker   . Smokeless tobacco: Never Used  . Alcohol Use: No  . Drug Use: No  . Sexual Activity: Yes    Birth Control/ Protection: Pill   Other Topics Concern  . None   Social History Narrative   Additional History:    Sleep: Fair  Appetite:  Fair   Assessment:   Musculoskeletal: Strength & Muscle Tone: within normal limits Gait & Station: normal Patient leans: N/A   Psychiatric Specialty Exam: Physical Exam  Review of Systems  Constitutional: Negative for fever and chills.  HENT:       Migraines   Eyes: Negative.        States she has been diagnosed with cataracts and recommended she will need elective surgery for this in the future .  Respiratory: Negative for cough and shortness of breath.   Cardiovascular: Negative for chest pain.  Gastrointestinal: Negative for vomiting.  Skin: Negative for rash.  Neurological: Positive for headaches.  Psychiatric/Behavioral: Positive for depression and suicidal ideas.    Blood pressure 112/74, pulse 85, temperature 97.8 F (36.6 C), temperature source Oral, resp. rate 16, height _0  (1.753 m), weight 233 lb (105.688 kg), last menstrual period 03/29/2014.Body mass index is 34.39 kg/(m^2).  General Appearance: Fairly Groomed  Engineer, water::  Good  Speech:  Normal Rate- not pressured at this time  Volume:  Normal  Mood:  Depressed  Affect:  Constricted, Tearful and but also more reactive, and did smile at times appropriately, no longer irritable  Thought Process:  Goal Directed and Linear- no flight of ideations  Orientation:  Full (Time, Place, and Person)  Thought Content:  denies hallucinations, no delusions expressed, no grandiose ideations  Suicidal Thoughts:  Yes.  with intent/plan- as above, expressing thoughts of hitting head against the wall, suicidal ruminations  Homicidal Thoughts:  No  Memory:  Recent and remote grossly  intact   Judgement:  Fair  Insight:  Fair  Psychomotor Activity:  Normal- not psychomotorically restless at this time   Concentration:  Fair  Recall:  [employer] of Knowledge:Good  Language: Good  Akathisia:  Negative  Handed:  Right  AIMS (if indicated):     Assets:  Desire for Improvement Resilience Vocational/Educational  ADL's: currently impaired   Cognition: WNL  Sleep:  Number of Hours: 5     Current Medications: Current Facility-Administered Medications  Medication Dose Route Frequency Provider Last Rate Last Dose  . acetaminophen (TYLENOL) tablet 650 mg  650 mg Oral Q6H PRN Patrecia Pour, NP   650 mg at 04/16/14 1300  . alum & mag hydroxide-simeth (MAALOX/MYLANTA) 200-200-20 MG/5ML suspension 30 mL  30 mL Oral Q4H PRN Patrecia Pour, NP      . divalproex (DEPAKOTE ER) 24 hr tablet 1,000 mg  1,000 mg Oral QHS Asa Saunas  Lord, NP   1,000 mg at 04/15/14 2120  . divalproex (DEPAKOTE ER) 24 hr tablet 250 mg  250 mg Oral QHS Myer Peer Meghna Hagmann, MD   250 mg at 04/15/14 2121  . hydrochlorothiazide (HYDRODIURIL) tablet 25 mg  25 mg Oral Daily Patrecia Pour, NP   25 mg at 04/16/14 9357  . lamoTRIgine (LAMICTAL) tablet 200 mg  200 mg Oral QHS Patrecia Pour, NP   200 mg at 04/15/14 2120  . LORazepam (ATIVAN) tablet 0.5 mg  0.5 mg Oral 3 times per day Jenne Campus, MD   0.5 mg at 04/16/14 1300  . lurasidone (LATUDA) tablet 80 mg  80 mg Oral q1800 Patrecia Pour, NP   80 mg at 04/16/14 1714  . magnesium hydroxide (MILK OF MAGNESIA) suspension 30 mL  30 mL Oral Daily PRN Patrecia Pour, NP      . metoprolol succinate (TOPROL-XL) 24 hr tablet 100 mg  100 mg Oral Daily Patrecia Pour, NP   100 mg at 04/16/14 0177  . multivitamin with minerals tablet 1 tablet  1 tablet Oral Daily Patrecia Pour, NP   1 tablet at 04/16/14 [phone]  . ondansetron (ZOFRAN-ODT) disintegrating tablet 4 mg  4 mg Oral Q8H PRN Kerrie Buffalo, NP   4 mg at 04/15/14 1516  . zolpidem (AMBIEN) tablet 5 mg  5 mg Oral QHS  PRN Patrecia Pour, NP   5 mg at 04/15/14 2258    Lab Results:  No results found for this or any previous visit (from the past 48 hour(s)).  Physical Findings: AIMS: Facial and Oral Movements Muscles of Facial Expression: None, normal Lips and Perioral Area: None, normal Jaw: None, normal Tongue: None, normal,Extremity Movements Upper (arms, wrists, hands, fingers): None, normal Lower (legs, knees, ankles, toes): None, normal, Trunk Movements Neck, shoulders, hips: None, normal, Overall Severity Severity of abnormal movements (highest score from questions above): None, normal Incapacitation due to abnormal movements: None, normal Patient's awareness of abnormal movements (rate only patient's report): No Awareness, Dental Status Current problems with teeth and/or dentures?: No Does patient usually wear dentures?: No  CIWA:    COWS:      Assessment-  Gradual improvement, in particular regarding less irritability and dysphoria, and being better related overall. Tolerating medications well at present . Denies side effects. Still on 1:1 observation due to inability to contract for safety.  Treatment Plan Summary: Daily contact with patient to assess and evaluate symptoms and progress in treatment, Medication management, Plan continue inpatient treatment and continue medications as below  Continue Depakote ER to 1250 mgrs QHS  Continue Lamictal 200 mgrs QDAY Continue Latuda 80 mgrs QDAY  Continue  Ativan 0.5 mgrs Q 8 hours for anxiety .  Continue 1:1 observation level for safety.  Medical Decision Making:  Established Problem, Stable/Improving (1), Review of Psycho-Social Stressors (1), Review or order clinical lab tests (1), Review of Last Therapy Session (1) and Review of New Medication or Change in Dosage (2)     Maurico Perrell 04/16/2014, 6:42 PM

## 2014-04-16 NOTE — Progress Notes (Signed)
D: Patient woke up from sleeping earlier reporting a headache requesting Tylenol. Given to patient and she was going to try to go back to sleep. No attempts to harm self. A: Staff will monitor on 1:1 for safety due to self harm thoughts. R: Cooperative with staff at present.

## 2014-04-16 NOTE — [facility] Group Notes (Signed)
[place] LCSW Group Therapy  04/16/2014 3:05 PM  Type of Therapy:  Group Therapy  Participation Level:  Did Not Attend - patient sleeping.  [name] 04/16/2014, 3:05 PM

## 2014-04-17 ENCOUNTER — Ambulatory Visit: Payer: PPO | Admitting: Psychology

## 2014-04-17 MED ORDER — LORAZEPAM 1 MG PO TABS
1.0000 mg | ORAL_TABLET | Freq: Three times a day (TID) | ORAL | Status: DC
  Administered 2014-04-17 – 2014-04-20 (×9): 1 mg via ORAL
  Filled 2014-04-17 (×9): qty 1

## 2014-04-17 MED ORDER — LORAZEPAM 1 MG PO TABS
1.0000 mg | ORAL_TABLET | Freq: Once | ORAL | Status: AC
  Administered 2014-04-17: 1 mg via ORAL
  Filled 2014-04-17: qty 1

## 2014-04-17 MED ORDER — LURASIDONE HCL 40 MG PO TABS
40.0000 mg | ORAL_TABLET | Freq: Every day | ORAL | Status: DC
  Administered 2014-04-17 – 2014-04-18 (×2): 40 mg via ORAL
  Filled 2014-04-17 (×4): qty 1

## 2014-04-17 MED ORDER — HYDROXYZINE HCL 50 MG PO TABS
50.0000 mg | ORAL_TABLET | Freq: Once | ORAL | Status: AC
  Administered 2014-04-17: 50 mg via ORAL
  Filled 2014-04-17 (×2): qty 1

## 2014-04-17 MED ORDER — DIVALPROEX SODIUM ER 500 MG PO TB24
500.0000 mg | ORAL_TABLET | Freq: Every day | ORAL | Status: DC
  Administered 2014-04-17 – 2014-04-23 (×7): 500 mg via ORAL
  Filled 2014-04-17 (×9): qty 1

## 2014-04-17 MED ORDER — LORAZEPAM 1 MG PO TABS
1.0000 mg | ORAL_TABLET | ORAL | Status: AC
  Administered 2014-04-17: 1 mg via ORAL
  Filled 2014-04-17: qty 1

## 2014-04-17 NOTE — Progress Notes (Signed)
Recreation Therapy Notes  Date: 03.18.2016 Time: 9:30am Location: [address] Group Room   Group Topic: Stress Management  Goal Area(s) Addresses:  Patient will actively participate in stress management techniques presented during session.   Behavioral Response: Did not attend. Patient initially arrived to group, as if she was going to attend, however upon being informed of techniques that would be used during session left without explanation. Patient did not return to group.   Laureen Ochs Carlyon Nolasco, LRT/CTRS  Lane Hacker 04/17/2014 4:35 PM

## 2014-04-17 NOTE — Progress Notes (Signed)
D: Patient slept on and off since 0330. No attempts to harm self. A: Staff will monitor on 1:1 for safety R: Cooperative with 1:1 and taking medications as ordered.

## 2014-04-17 NOTE — Progress Notes (Addendum)
Patient ID: [name], female   DOB: Aug 16, 1970, 44 y.o.   MRN: [id number] [facility] MD Progress Note  04/17/2014 1:12 PM [name]  MRN:  [id number] Subjective:   Patient states she is feeling worse and " terrible " at this time. She describes a significant subjective sense of agitation, irritability and restlessness. Of note, no physical restlessness is noted, and she does not appear particularly psychomotorically agitated, although she has been pacing hallway at times . She continues to make suicidal statements and states " if I was not on the one to one, I would end it all, I would bang my head against the wall, so that all you guys would have to do is to clean up the mess later". Although she is not describing medication side effects, she is having some symptoms suggestive of akathisia ( agitation, unpleasant sense of being hyperactive) which may be related to current bipolar decompensation but could also be related to latuda side effect.  She does feel Ativan has helped decrease severity of agitation. .  Objective : I have discussed case with treatment team and have met with patient. Patient's presentation has been variable. At times calmer, better related, and at others , such as today, clearly more agitated and irritable. At this time still unable to contract for safety, see above . She is making statements such as " feeling like I am going out of my skin", " like I am going to explode on someone ", but although irritable has not been threatening or violent. Denies psychotic symptoms, and does not appear internally preoccupied . Responds fairly well to support, encouragement and review of  Ego strengths and strategies to help, such as distracting herself  With group or individual activities /deep breathing   Principal Problem:Bipolar Disorder Mixed  Diagnosis:   Patient Active Problem List   Diagnosis Date Noted  . Bipolar affective disorder, depressed, severe [F31.4] 04/12/2014  .  Suicidal ideation [R45.851] 04/12/2014  . Injury of right shoulder and upper arm [S49.91XA] 02/17/2014  . Left leg pain [M79.605] 06/03/2013  . Right hip pain [M25.551] 06/03/2013  . Migraine with status migrainosus [G43.901] 01/08/2013  . Personality disorder [F60.9]   . Chronic migraine [G43.709] 05/08/2012  . Contact dermatitis [L25.9] 11/27/2011  . Major depressive disorder, recurrent episode [F33.9] 10/27/2011  . Generalized anxiety disorder [F41.1] 10/27/2011  . 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 Borderline personality disorder [F60.3] 10/27/2011  . Right foot pain [M79.671] 09/28/2011  . 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 Ganglion cyst [M67.40] 09/29/2009  . 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 BIPOLAR DISORDER UNSPECIFIED [F31.9] 06/09/2008   Total Time spent with patient: 30 minutes   Past Medical History:  Past Medical History  Diagnosis Date  . Bipolar disorder   . Eczema   . Pes planus   . Achilles tendinitis   . Hypertension   . Migraine   . Achilles tendinitis   . Ganglion cyst 09/29/2009    left wrist (2 cyst)  . Arthritis   . Allergy   . Lipoma   . ADHD (attention deficit hyperactivity disorder)   . Hyperprolactinemia   . Bipolar affective   . Personality disorder     Past Surgical History  Procedure Laterality Date  . Tumor resection left thigh    . Ankle surgery  12/88    left   . Lipoma removal    .  Ganglion cyst excision  2011  . Chest nodule  1990?    rt chest wall nodule removal  . Shoulder surgery  01/13/2011    right, partial tear  . Right bunioectomy     Family History:  Family History  Problem Relation Age of Onset  . Hypertension Mother   . Hyperlipidemia Mother   . Heart attack Father   . Heart disease Father   . Hypertension Father   . Diabetes Paternal Grandfather   . Heart disease Maternal Aunt   . Breast  cancer Maternal Aunt   . Heart disease Maternal Grandmother   . Cancer Maternal Grandmother     colon   Social History:  History  Alcohol Use No     History  Drug Use No    History   Social History  . Marital Status: Single    Spouse Name: N/A  . Number of Children: 0  . 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 Smoking status: Never Smoker   . Smokeless tobacco: Never Used  . Alcohol Use: No  . Drug Use: No  . Sexual Activity: Yes    Birth Control/ Protection: Pill   Other Topics Concern  . None   Social History Narrative   Additional History:    Sleep: Fair  Appetite:  Fair   Assessment:   Musculoskeletal: Strength & Muscle Tone: within normal limits Gait & Station: normal Patient leans: N/A   Psychiatric Specialty Exam: Physical Exam  Review of Systems  Constitutional: Negative for fever and chills.  HENT:       Although headache improved today, following Imitrex dose yesterday  Respiratory: Negative for cough and shortness of breath.   Cardiovascular: Negative for chest pain.  Gastrointestinal: Negative for vomiting.  Genitourinary: Negative for dysuria, urgency and frequency.  Skin: Negative for rash.  Neurological: Positive for headaches.  Psychiatric/Behavioral: Positive for depression and suicidal ideas. The patient is nervous/anxious.        Irritability, dysphoria     Blood pressure 116/89, pulse 81, temperature 98 F (36.7 C), temperature source Oral, resp. rate 16, height 5' 9"  (1.753 m), weight 233 lb (105.688 kg), last menstrual period 03/29/2014.Body mass index is 34.39 kg/(m^2).  General Appearance: Fairly Groomed  Engineer, water::  Good  Speech:  Normal Rate- not pressured at this time  Volume:  Normal  Mood:  Depressed, today also more irritable, dysphoric   Affect:  Labile and irritable  Thought Process:  Goal Directed and Linear- no flight of ideations  Orientation:  Full (Time,  Place, and Person)  Thought Content:  denies hallucinations, no delusions expressed, no grandiose ideations, focusing on unpleasant state of agitation , restlessness   Suicidal Thoughts:  Yes.  with intent/plan- as above, expressing thoughts of hitting head against the wall, suicidal ruminations  Homicidal Thoughts:  No- has denied any thoughts of hurting anyone else   Memory:  Recent and remote grossly intact   Judgement:  Fair  Insight:  Present  Psychomotor Activity: some restlessness, primarily subjective, but also some pacing noted   Concentration:  Fair  Recall:  [employer] of Knowledge:Good  Language: Good  Akathisia:  Negative  Handed:  Right  AIMS (if indicated):     Assets:  Desire for Improvement Resilience Vocational/Educational  ADL's: currently impaired   Cognition: WNL  Sleep:  Number of Hours: 5     Current Medications: Current Facility-Administered  Medications  Medication Dose Route Frequency Provider Last Rate Last Dose  . acetaminophen (TYLENOL) tablet 650 mg  650 mg Oral Q6H PRN Patrecia Pour, NP   650 mg at 04/16/14 1300  . alum & mag hydroxide-simeth (MAALOX/MYLANTA) 200-200-20 MG/5ML suspension 30 mL  30 mL Oral Q4H PRN Patrecia Pour, NP      . divalproex (DEPAKOTE ER) 24 hr tablet 1,000 mg  1,000 mg Oral QHS Patrecia Pour, NP   1,000 mg at 04/16/14 2134  . divalproex (DEPAKOTE ER) 24 hr tablet 500 mg  500 mg Oral QHS Fernando A Cobos, MD      . hydrochlorothiazide (HYDRODIURIL) tablet 25 mg  25 mg Oral Daily Patrecia Pour, NP   25 mg at 04/17/14 4431  . lamoTRIgine (LAMICTAL) tablet 200 mg  200 mg Oral QHS Patrecia Pour, NP   200 mg at 04/16/14 2135  . LORazepam (ATIVAN) tablet 1 mg  1 mg Oral 3 times per day Jenne Campus, MD   1 mg at 04/17/14 1400  . lurasidone (LATUDA) tablet 40 mg  40 mg Oral q1800 Myer Peer Cobos, MD      . magnesium hydroxide (MILK OF MAGNESIA) suspension 30 mL  30 mL Oral Daily PRN Patrecia Pour, NP      . metoprolol  succinate (TOPROL-XL) 24 hr tablet 100 mg  100 mg Oral Daily Patrecia Pour, NP   100 mg at 04/17/14 5400  . multivitamin with minerals tablet 1 tablet  1 tablet Oral Daily Patrecia Pour, NP   1 tablet at 04/17/14 [phone]  . ondansetron (ZOFRAN-ODT) disintegrating tablet 4 mg  4 mg Oral Q8H PRN Kerrie Buffalo, NP   4 mg at 04/15/14 1516    Lab Results:  No results found for this or any previous visit (from the past 48 hour(s)).  Physical Findings: AIMS: Facial and Oral Movements Muscles of Facial Expression: None, normal Lips and Perioral Area: None, normal Jaw: None, normal Tongue: None, normal,Extremity Movements Upper (arms, wrists, hands, fingers): None, normal Lower (legs, knees, ankles, toes): None, normal, Trunk Movements Neck, shoulders, hips: None, normal, Overall Severity Severity of abnormal movements (highest score from questions above): None, normal Incapacitation due to abnormal movements: None, normal Patient's awareness of abnormal movements (rate only patient's report): No Awareness, Dental Status Current problems with teeth and/or dentures?: No Does patient usually wear dentures?: No  CIWA:    COWS:      Assessment-   Today patient presents with increased agitation and irritability. Some of her symptoms may be related to mixed bipolar episode, personality disorder decompensation, but also may be experiencing some akathisia from latuda, as she has been pacing and makes statement such as that she feels as if " coming out of her own skin" .  Ativan has helped  Partially and she has not been sedated by it. Thus far has tolerated Depakote titration well. There has been no rash ( she is also on Lamictal)  Vitals are stable .  Treatment Plan Summary: Daily contact with patient to assess and evaluate symptoms and progress in treatment, Medication management, Plan continue inpatient treatment and continue medications as below  Increase Depakote ER to 1500 mgrs QHS  Continue  Lamictal 200 mgrs QDAY Taper  Latuda  Down to 40 mgrs QDAY  Increase  Ativan  To 1  mgrs Q 8 hours for anxiety .  Continue 1:1 observation level for safety.  Medical Decision Making:  Review  of Psycho-Social Stressors (1), Review or order clinical lab tests (1), Established Problem, Worsening (2), Review of Last Therapy Session (1) and Review of New Medication or Change in Dosage (2)     COBOS, FERNANDO 04/17/2014, 1:12 PM

## 2014-04-17 NOTE — Progress Notes (Signed)
Pt alert and oriented. Pt anxious but still cooperative. Pt grew more agitated while waiting on anxiety medication. Pt began to yell and get aggressive with RN. Pt deescalated. Pt remains on 1:1 observation for safety. Pt has passive thoughts of harming herself. She has made comments that refer to banging her head against the wall if left alone. Pt medicated and redirected. MD and Charge nurse aware. Pt remains on 1:1. No current concerns. RN will continue to monitor.

## 2014-04-17 NOTE — [facility] Group Notes (Signed)
[facility] LCSW Aftercare Discharge Planning Group Note   04/17/2014 9:45 AM    Participation Quality:  Appropraite  Mood/Affect:  Appropriate  Depression Rating:  7  Anxiety Rating:  9  Thoughts of Suicide:  Yes  Will you contract for safety?  No  Current AVH:  No  Plan for Discharge/Comments:  Patient attended discharge planning group and actively participated in group. Patient continues to endorse SI and unable to contract for safety.  She has outpatient providers as noted in previously.  Suicide prevention education reviewed and SPE document provided.   Transportation Means: Patient has transportation.   Supports:  Patient has a support system.   Kden Wagster, Eulas Post

## 2014-04-17 NOTE — Progress Notes (Signed)
Pt alert and oriented. Pt anxious but cooperative this morning. Pt was medicated and was compliant with meds. Pt on 1:1 observation for safety. Pt has passive thoughts of harming herself. She has made comments that refer to banging her head against the wall if left alone. MD and Charge nurse aware. Pt remains on 1:1. No current concerns. RN will continue to monitor.

## 2014-04-17 NOTE — Progress Notes (Signed)
D: Patient has been awake a lot tonight. Vistaril given earlier to help with anxiety and sleep. Patient frustrated about sleep but made no threats to bang head. A: Staff will monitor on 1:1 for safety R: Cooperative with 1:1

## 2014-04-17 NOTE — Progress Notes (Signed)
Psychoeducational Group Note  Date:  04/17/2014 Time:  2142  Group Topic/Focus:  Wrap-Up Group:   The focus of this group is to help patients review their daily goal of treatment and discuss progress on daily workbooks.  Participation Level: Did Not Attend  Participation Quality:  Not Applicable  Affect:  Not Applicable  Cognitive:  Not Applicable  Insight:  Not Applicable  Engagement in Group: Not Applicable  Additional Comments:  The patient did not attend group this evening since she was asleep in her bed.   Archie Balboa S 04/17/2014, 9:41 PM

## 2014-04-17 NOTE — Tx Team (Signed)
Interdisciplinary Treatment Plan Update   Date Reviewed:  04/17/2014  Time Reviewed:  8:24 AM  Progress in Treatment:   Attending groups: Yes, patient is attending groups. Participating in groups: Yes, engages in group discussion. Taking medication as prescribed: Yes  Tolerating medication: Yes Family/Significant other contact made: Yes, collateral contact with mother. Patient understands diagnosis: Yes, patient understands diagnosis and need for treatment. Discussing patient identified problems/goals with staff: Yes, patient is able to express goals for treatment and discharge. Medical problems stabilized or resolved: Yes Denies suicidal/homicidal ideation: Yes Patient has not harmed self or others: Yes  For review of initial/current patient goals, please see plan of care.  Estimated Length of Stay:  4-5 days  Reasons for Continued Hospitalization:  Anxiety Depression Medication stabilization   New Problems/Goals identified:    Discharge Plan or Barriers:   Home with outpatient follow up with Letta Moynahan and Clint Bolder at [facility]  Additional Comments:  Continue medication stabilizaton  Patient and CSW reviewed patient's identified goals and treatment plan.  Patient verbalized understanding and agreed to treatment plan.   Attendees:  Patient:  04/17/2014 8:24 AM   Signature:  Gabriel Earing, MD 04/17/2014 8:24 AM  Signature:  04/17/2014 8:24 AM  Signature:  Anson Crofts,  RN 04/17/2014 8:24 AM  Signature: Barbie Haggis, RN 04/17/2014 8:24 AM  Signature: 04/17/2014 8:24 AM  Signature:  Joette Catching, LCSW 04/17/2014 8:24 AM  Signature: Maxie Better, LCSW-A 04/17/2014 8:24 AM  Signature:   04/17/2014 8:24 AM  Signature:   04/17/2014 8:24 AM  Signature:  04/17/2014  8:24 AM  Signature:   Lars Pinks, RN [facility] 04/17/2014  8:24 AM  Signature:   04/17/2014  8:24 AM    Scribe for Treatment Team:   Joette Catching,  04/17/2014 8:24 AM

## 2014-04-17 NOTE — Progress Notes (Signed)
Patient ID: [name], female   DOB: [birth year], 44 y.o.   MRN: [id number]  1:1 note: Pt sitting in bed with legs crossed talking to her sitter. Pt states "I haven't been sleeping well. That brought me to a place like this once." Pt reports passive SI. Sitter in chair next to pt. Pt given 1800 medication. Pt will remain on a 1:1 for her safety due to SI. Will continue to monitor.

## 2014-04-17 NOTE — B[facility] Group Notes (Signed)
[facility] LCSW Group Therapy  04/17/2014 3:59 PM  Type of Therapy:  Group Therapy  Participation Level:  Did Not Attend   [name] 04/17/2014, 3:59 PM

## 2014-04-17 NOTE — Progress Notes (Signed)
D: Patient has been staying in room and trying to sleep after getting anxious and agitated around shift change. Given ativan earlier tonight. Took bedtime medications that were increased today due to sleep issues. Patient hoping that she is able to get some sleep. No attempts to harm self.  A: Staff will monitor on q 15 minute checks, follow treatment plan, and give meds as ordered. R: Cooperative at this time.

## 2014-04-18 ENCOUNTER — Encounter (HOSPITAL_COMMUNITY): Payer: Self-pay | Admitting: Registered Nurse

## 2014-04-18 DIAGNOSIS — F3160 Bipolar disorder, current episode mixed, unspecified: Secondary | ICD-10-CM | POA: Diagnosis present

## 2014-04-18 HISTORY — DX: Bipolar disorder, current episode mixed, unspecified: F31.60

## 2014-04-18 MED ORDER — TRAZODONE HCL 100 MG PO TABS
100.0000 mg | ORAL_TABLET | Freq: Every evening | ORAL | Status: DC | PRN
  Administered 2014-04-18 – 2014-04-23 (×5): 100 mg via ORAL
  Filled 2014-04-18 (×5): qty 1

## 2014-04-18 MED ORDER — TRAZODONE HCL 100 MG PO TABS
100.0000 mg | ORAL_TABLET | Freq: Once | ORAL | Status: AC
  Administered 2014-04-18: 100 mg via ORAL
  Filled 2014-04-18 (×2): qty 1

## 2014-04-18 NOTE — Progress Notes (Signed)
Patient ID: [name], female   DOB: 01/07/1971, 44 y.o.   MRN: [id number]  Pt is in bed playing card games with her sitter. Pt seems to be in no distress. Pt breathing is regular and unlabored. Sitter at bedside and interacting with pt. Pt given scheduled medication. Pt on a 1:1 due to suicidal ideation. Will continue to monitor.

## 2014-04-18 NOTE — Progress Notes (Signed)
Patient ID: [name], female   DOB: 04/12/1970, 44 y.o.   MRN: [id number]  Pt currently presents with a flat affect and anxious behavior. Per self inventory, pt rates depression at a 7, hopelessness 7 and anxiety 10+. Pt's daily goal is to "not hurt myself or to really hurt myself" and they intend to do so by "try and stay in control or to look for the right moment." Pt reports poor sleep for " a couple of days, I can tell it's making things worse."Pt also reports high energy, poor concentration and a fair appetite.   Pt provided with medications per providers orders. Pt's labs and vitals were monitored throughout the day. Pt supported emotionally and encouraged to express concerns and questions. Pt educated on medications, mindfulness and bipolar disorder.  Pt's safety ensured with 15 minute and environmental checks. Pt currently denies SI/HI and A/V hallucinations. Pt verbally agrees to seek staff if SI/HI or A/VH occurs and to consult with staff before acting on these thoughts. Pt states "please help me rest and be okay or just let me die."

## 2014-04-18 NOTE — Progress Notes (Signed)
[facility] Group Notes:  (Nursing/MHT/Case Management/Adjunct)  Date:  04/18/2014  Time:  11:53 PM  Type of Therapy:  Group Therapy  Participation Level:  Minimal  Participation Quality:  Appropriate  Affect:  Appropriate and Blunted  Cognitive:  Appropriate  Insight:  Appropriate  Engagement in Group:  Engaged  Modes of Intervention:  Socialization and Support  Summary of Progress/Problems: Pt. Was unsure of her energy level.  Pt. Stated her friends were her support group.  Pt. Stated she felt manic and was concerned about her change in medicine.  Lanell Persons 04/18/2014, 11:53 PM

## 2014-04-18 NOTE — Progress Notes (Addendum)
Patient ID: [name], female   DOB: [birth year], 44 y.o.   MRN: [id number]   1:1 Note: Pt seems to be asleep in bed. Pt's breathing is even and unlabored. Pt in no current distress. Sitter at bedside. Sitter given a break. Pt remains on a 1:1 for safety. Will continue to monitor.

## 2014-04-18 NOTE — Progress Notes (Signed)
D: Patient only slept about 1hr 45 minutes before waking up after taking trazodone dose. Patient came up to nurse's station verbalizing her frustration and was notably agitated. Patient did go back to room for a while then came back up and continued to verbalize her anger about not being able to sleep. She still continues to make threatening type remarks about banging head into wall and made a vague statement about waiting to do it after I go to report. Patient trying to get a reaction from staff. Given her schedules ativan this am. Patient states will try to get some sleep.  A: Staff will monitor on 1:1 for safety R: Maintaining control at this time but only after talking to undersigned for 40 minutes

## 2014-04-18 NOTE — Progress Notes (Signed)
D: Patient unable to sleep long tonight with the medication change that was prescribed. Very frustrated and feels that all is not being done to help her with her sleep. Reports more agitation today due to repeated nights of not being able to stay asleep during the night. Trazodone given at 0115. A: Will continue to monitor 1:1 for safety due to self harm R: Cooperative at this time but very frustrated.

## 2014-04-18 NOTE — Progress Notes (Signed)
1:1 Nursing note- Writer entered patients room and observed her sitting up in bed with a bag of pretzels in her hand and her book lying on her chest, she had fallen off to sleep. Writer called patients name and she was easily awakened, she reports that she had just stopped reading. Writer had spoken with patient earlier and she was upset because she had no order for trazadone and was told that she would received this along with her other 2200 meds to help her to sleep tonight. Patient c/o not sleeping for the last couple of nights. Writer spoke with NP Dell Ponto and a one time order received for trazadone. Patient was medicated, support and encouragement given, safety maintained on unit with 15 min checks. 1:1 continues and patient is safe.
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
## 2014-04-18 NOTE — Progress Notes (Signed)
Patient ID: [name], female   DOB: 11-Sep-1970, 44 y.o.   MRN: [id number]  1:1 Note: Pt sitting on bed playing cards with sitter. Pt smiling. Pt asks writer, "What's the name of that actress who plays in the hunger games, [name] something." Pt reports 'I just couldn't get it together and I couldn't work as much as before. Things were getting worse and I needed help" Pt not actively expressing SI. Sitter at bedside. Scheduled medications given. Pt on a 1:1 for suicidal ideation. Will continue to monitor.

## 2014-04-18 NOTE — Progress Notes (Signed)
Patient ID: [name], female   DOB: 05/17/1970, 44 y.o.   MRN: [id number] [facility] MD Progress Note  04/18/2014 12:04 PM [name]  MRN:  [id number] Subjective:   Patient states "I am aggravated and agitated.  I can't get any sleep and nobody won't fix my meds where I can get no sleep.  I am manic during the day and the Ativan helps to relax me but makes me tired during the day; but I needed it so I won't be so agitated toward the nurses.  Dr. Rodney Cruise talked to Dr. Caprice Beaver and he said no major med changes.  What is the reason that I am here if I am feeling worse, might as well let me go home."   Objective :  Patient continues to describe subjective senses of agitation, irritability and restlessness.  Attending some group sessions when not in her room sleep.  Not sleeping during the night.  Unable to rule out if it is related to sleeping during the day.  Discussed with nursing staff to try to keep patient out of bed during the day to see if it will help with sleep to night.      Principal Problem:Bipolar Disorder Mixed  Diagnosis:   Patient Active Problem List   Diagnosis Date Noted  . Bipolar I disorder, most recent episode mixed [F31.60] 04/18/2014  . Bipolar affective disorder, depressed, severe [F31.4] 04/12/2014  . Suicidal ideation [R45.851] 04/12/2014  . Injury of right shoulder and upper arm [S49.91XA] 02/17/2014  . Left leg pain [M79.605] 06/03/2013  . Right hip pain [M25.551] 06/03/2013  . Migraine with status migrainosus [G43.901] 01/08/2013  . Personality disorder [F60.9]   . Chronic migraine [G43.709] 05/08/2012  . Contact dermatitis [L25.9] 11/27/2011  . Major depressive disorder, recurrent episode [F33.9] 10/27/2011  . Generalized anxiety disorder [F41.1] 10/27/2011  . 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 Borderline personality disorder [F60.3] 10/27/2011  . Right foot pain [M79.671] 09/28/2011  . 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 Ganglion cyst [M67.40] 09/29/2009  . 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 BIPOLAR DISORDER UNSPECIFIED [F31.9] 06/09/2008   Total Time spent with patient: 30 minutes   Past Medical History:  Past Medical History  Diagnosis Date  . Bipolar disorder   . Eczema   . Pes planus   . Achilles tendinitis   . Hypertension   . Migraine   . Achilles tendinitis   . Ganglion cyst 09/29/2009    left wrist (2 cyst)  . Arthritis   . Allergy   . Lipoma   . ADHD (attention deficit hyperactivity disorder)   . Hyperprolactinemia   . Bipolar affective   . Personality disorder     Past Surgical History  Procedure Laterality Date  . Tumor resection left thigh    . Ankle surgery  12/88    left   . Lipoma removal    . Ganglion cyst excision  2011  . Chest nodule  1990?    rt chest wall nodule removal  . Shoulder surgery  01/13/2011    right, partial tear  . Right bunioectomy     Family History:  Family History  Problem Relation Age of Onset  . Hypertension Mother   . Hyperlipidemia Mother   . Heart attack Father   . Heart disease Father   . Hypertension Father   . Diabetes Paternal Grandfather   . Heart  disease Maternal Aunt   . Breast cancer Maternal Aunt   . Heart disease Maternal Grandmother   . Cancer Maternal Grandmother     colon   Social History:  History  Alcohol Use No     History  Drug Use No    History   Social History  . Marital Status: Single    Spouse Name: N/A  . Number of Children: 0  . 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 Smoking status: Never Smoker   . Smokeless tobacco: Never Used  . Alcohol Use: No  . Drug Use: No  . Sexual Activity: Yes    Birth Control/ Protection: Pill   Other Topics Concern  . None   Social History Narrative   Additional History:    Sleep: Fair  Appetite:  Fair   Assessment:    Musculoskeletal: Strength & Muscle Tone: within normal limits Gait & Station: normal Patient leans: N/A   Psychiatric Specialty Exam: Physical Exam  ROS  Blood pressure 115/75, pulse 77, temperature 98 F (36.7 C), temperature source Oral, resp. rate 16, height 5\' 9"  (1.753 m), weight 105.688 kg (233 lb), last menstrual period 03/29/2014.Body mass index is 34.39 kg/(m^2).  General Appearance: Fairly Groomed  Engineer, water::  Good  Speech:  Normal Rate- not pressured at this time  Volume:  Normal  Mood:  Angry, Depressed and Irritable, today also more irritable, dysphoric   Affect:  Blunt, Depressed, Labile and irritable  Thought Process:  Goal Directed and Linear- no flight of ideations  Orientation:  Full (Time, Place, and Person)  Thought Content:  denies hallucinations, no delusions expressed, no grandiose ideations, focusing on unpleasant state of agitation , restlessness   Suicidal Thoughts:  Yes.  with intent/plan- as above, expressing thoughts of hitting head against the wall, suicidal ruminations  Homicidal Thoughts:  No- has denied any thoughts of hurting anyone else   Memory:  Recent and remote grossly intact   Judgement:  Fair  Insight:  Present  Psychomotor Activity: some restlessness, primarily subjective, but also some pacing noted   Concentration:  Fair  Recall:  [employer] of Knowledge:Good  Language: Good  Akathisia:  Negative  Handed:  Right  AIMS (if indicated):     Assets:  Desire for Improvement Resilience Vocational/Educational  ADL's: currently impaired   Cognition: WNL  Sleep:  Number of Hours: 3.25     Current Medications: Current Facility-Administered Medications  Medication Dose Route Frequency Provider Last Rate Last Dose  . acetaminophen (TYLENOL) tablet 650 mg  650 mg Oral Q6H PRN Patrecia Pour, NP   650 mg at 04/16/14 1300  . alum & mag hydroxide-simeth (MAALOX/MYLANTA) 200-200-20 MG/5ML suspension 30 mL  30 mL Oral Q4H PRN Patrecia Pour, NP      . divalproex (DEPAKOTE ER) 24 hr tablet 1,000 mg  1,000 mg Oral QHS Patrecia Pour, NP   1,000 mg at 04/17/14 2216  . divalproex (DEPAKOTE ER) 24 hr tablet 500 mg  500 mg Oral QHS Myer Peer Cobos, MD   500 mg at 04/17/14 2216  . hydrochlorothiazide (HYDRODIURIL) tablet 25 mg  25 mg Oral Daily Patrecia Pour, NP   25 mg at 04/18/14 5462  . lamoTRIgine (LAMICTAL) tablet 200 mg  200 mg Oral QHS Patrecia Pour, NP   200 mg at 04/17/14 2216  . LORazepam (ATIVAN) tablet 1 mg  1 mg  Oral 3 times per day Jenne Campus, MD   1 mg at 04/18/14 0458  . lurasidone (LATUDA) tablet 40 mg  40 mg Oral q1800 Jenne Campus, MD   40 mg at 04/17/14 1807  . magnesium hydroxide (MILK OF MAGNESIA) suspension 30 mL  30 mL Oral Daily PRN Patrecia Pour, NP      . metoprolol succinate (TOPROL-XL) 24 hr tablet 100 mg  100 mg Oral Daily Patrecia Pour, NP   100 mg at 04/18/14 5883  . multivitamin with minerals tablet 1 tablet  1 tablet Oral Daily Patrecia Pour, NP   1 tablet at 04/18/14 [phone]  . ondansetron (ZOFRAN-ODT) disintegrating tablet 4 mg  4 mg Oral Q8H PRN Kerrie Buffalo, NP   4 mg at 04/15/14 1516    Lab Results:  No results found for this or any previous visit (from the past 48 hour(s)).  Physical Findings: AIMS: Facial and Oral Movements Muscles of Facial Expression: None, normal Lips and Perioral Area: None, normal Jaw: None, normal Tongue: None, normal,Extremity Movements Upper (arms, wrists, hands, fingers): None, normal Lower (legs, knees, ankles, toes): None, normal, Trunk Movements Neck, shoulders, hips: None, normal, Overall Severity Severity of abnormal movements (highest score from questions above): None, normal Incapacitation due to abnormal movements: None, normal Patient's awareness of abnormal movements (rate only patient's report): No Awareness, Dental Status Current problems with teeth and/or dentures?: No Does patient usually wear dentures?: No  CIWA:    COWS:       Assessment-   Today patient presents with increased agitation and irritability. Some of her symptoms may be related to mixed bipolar episode, personality disorder decompensation, but also may be experiencing some akathisia from latuda, as she has been pacing and makes statement such as that she feels as if " coming out of her own skin" .  Ativan has helped  Partially and she has not been sedated by it. Thus far has tolerated Depakote titration well. There has been no rash ( she is also on Lamictal)  Vitals are stable .  Treatment Plan Summary: Daily contact with patient to assess and evaluate symptoms and progress in treatment, Medication management, Plan continue inpatient treatment and continue medications as below  Increase Depakote ER to 1500 mgrs QHS  Continue Lamictal 200 mgrs QDAY Taper  Latuda  Down to 40 mgrs QDAY  Increase  Ativan  To 1  mgrs Q 8 hours for anxiety .  Continue 1:1 observation level for safety.  Will increase Trazodone to have dose repeated x 1 if the first dose does not help with sleep.  Also discussed with nursing and patient that one of the Ativan should be taken at bedtime also.  No other changes at this time.  Will continue with current treatment plan.    Medical Decision Making:  Review of Psycho-Social Stressors (1), Review or order clinical lab tests (1), Established Problem, Worsening (2), Review of Last Therapy Session (1) and Review of New Medication or Change in Dosage (2)  [place] Churchill, FNP-BC 04/18/2014, 12:04 PM

## 2014-04-18 NOTE — Progress Notes (Signed)
The patient has woken up on several occasions and is complaining of not being able to sleep. The patient has taken a number of dosages of medications without success. Patient is presently pacing back and forth in her room and appears to be anxious. The patient has stated that she feels as if she is going through a "manic stage" and would simply like to go to sleep.

## 2014-04-18 NOTE — Progress Notes (Signed)
Patient ID: [name], female   DOB: [birth year], 44 y.o.   MRN: [id number] Adult Psychoeducational Group Note  Date:  04/18/2014 Time:  09:30  Group Topic/Focus:  Orientation:   The focus of this group is to educate the patient on the purpose and policies of crisis stabilization and provide a format to answer questions about their admission.  The group details unit policies and expectations of patients while admitted. Wellness Toolbox:   The focus of this group is to discuss various aspects of wellness, balancing those aspects and exploring ways to increase the ability to experience wellness.  Patients will create a wellness toolbox for use upon discharge.  Participation Level:  Did Not Attend  Participation Quality: n/a   Affect: n/a  Cognitive: n/a  Insight: n/a  Engagement in Group: n/a  Modes of Intervention:  Discussion, Education, Orientation and Support   Additional Comments:  Pt did not attend group. Pt was in bed asleep.   Elenore Rota 04/18/2014, 10:03 AM

## 2014-04-19 MED ORDER — LORAZEPAM 0.5 MG PO TABS
ORAL_TABLET | ORAL | Status: AC
  Administered 2014-04-19: 0.5 mg via ORAL
  Filled 2014-04-19: qty 1

## 2014-04-19 MED ORDER — LORAZEPAM 0.5 MG PO TABS
0.5000 mg | ORAL_TABLET | Freq: Once | ORAL | Status: AC
  Administered 2014-04-19 (×2): 0.5 mg via ORAL

## 2014-04-19 MED ORDER — LURASIDONE HCL 80 MG PO TABS
80.0000 mg | ORAL_TABLET | Freq: Every day | ORAL | Status: DC
  Administered 2014-04-19 – 2014-04-23 (×5): 80 mg via ORAL
  Filled 2014-04-19 (×5): qty 1
  Filled 2014-04-19: qty 3
  Filled 2014-04-19 (×3): qty 1
  Filled 2014-04-19: qty 3

## 2014-04-19 NOTE — Progress Notes (Signed)
Patient lying in bed asleep with eyes closed and respirations even and unlabored, no distress noted. 1:1 continues and patient is safe.

## 2014-04-19 NOTE — Progress Notes (Signed)
Patient lying in bed asleep with eyes closed and respirations even and unlabored. 1:1 continues with MHT at bedside, patient is safe.

## 2014-04-19 NOTE — Progress Notes (Signed)
Pt stated,"I feel like I am coming apart at the seams." pt was given .5mg  of ativan for her nerves.Pt does contract for safety and denies SI and HI. Pt did tell the writer she is off her 1:1 now and thinks she should be okay.

## 2014-04-19 NOTE — Progress Notes (Signed)
Patient's 1:1 discontinue per provider's order.  Patient had requested to try and come off 1:1 this morning.  She remains passively suicidal.  She feels she is able to alert nursing staff if these feeling overcome her.  She has been observed in her room playing cards with the tech sitting with her.  She discussed her care with provider and voiced her opposition of having the provider she had yesterday.  It was reported from nursing staff on night shift that patient also requested a different sitter because current sitter would not play cards with her.  Patient is encouraged to come out of room, go to groups and not isolate to her room.  She is also encouraged to go to the cafeteria for meals.  She is also upset because her trazodone was discontinued.  Patient had a full night's sleep last night.  Continue post notes for 8 hours post 1:1.

## 2014-04-19 NOTE — Progress Notes (Signed)
Patient ID: [name], female   DOB: [birth year], 44 y.o.   MRN: [id number] Patient ID: [name], female   DOB: [birth year], 44 y.o.   MRN: [id number] [facility] MD Progress Note  04/19/2014 12:25 PM [name]  MRN:  [id number] Subjective:   Patient states "I am aggravated and agitated.  I can't get any sleep and meds are being adjusted and I just don't know anymore.  Dr. Parke Poisson talked to Dr. Caprice Beaver and he said no major med changes.  What is the reason that I am here if I am feeling worse, might as well let me go home.  I just don"k know what's next."  Objective :  Patient continues to describe subjective senses of agitation, irritability and restlessness.  Attending some group sessions when not in her room sleep.  Not sleeping during the night.  Unable to rule out if it is related to sleeping during the day.  Discussed with nursing staff to try to keep patient out of bed during the day to see if it will help with sleep to night.    Principal Problem:Bipolar Disorder Mixed  Diagnosis:   Patient Active Problem List   Diagnosis Date Noted  . Bipolar I disorder, most recent episode mixed [F31.60] 04/18/2014  . Bipolar affective disorder, depressed, severe [F31.4] 04/12/2014  . Suicidal ideation [R45.851] 04/12/2014  . Injury of right shoulder and upper arm [S49.91XA] 02/17/2014  . Left leg pain [M79.605] 06/03/2013  . Right hip pain [M25.551] 06/03/2013  . Migraine with status migrainosus [G43.901] 01/08/2013  . Personality disorder [F60.9]   . Chronic migraine [G43.709] 05/08/2012  . Contact dermatitis [L25.9] 11/27/2011  . Major depressive disorder, recurrent episode [F33.9] 10/27/2011  . Generalized anxiety disorder [F41.1] 10/27/2011  . 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 Borderline personality disorder [F60.3] 10/27/2011  . Right foot pain [M79.671] 09/28/2011  . 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 Ganglion cyst [M67.40] 09/29/2009  . 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 BIPOLAR DISORDER UNSPECIFIED [F31.9] 06/09/2008   Total Time spent with patient: 30 minutes   Past Medical History:  Past Medical History  Diagnosis Date  . Bipolar disorder   . Eczema   . Pes planus   . Achilles tendinitis   . Hypertension   . Migraine   . Achilles tendinitis   . Ganglion cyst 09/29/2009    left wrist (2 cyst)  . Arthritis   . Allergy   . Lipoma   . ADHD (attention deficit hyperactivity disorder)   . Hyperprolactinemia   . Bipolar affective   . Personality disorder     Past Surgical History  Procedure Laterality Date  . Tumor resection left thigh    . Ankle surgery  12/88    left   . Lipoma removal    . Ganglion cyst excision  2011  . Chest nodule  1990?    rt chest wall nodule removal  . Shoulder surgery  01/13/2011    right, partial tear  . Right bunioectomy     Family History:  Family History  Problem Relation Age of Onset  . Hypertension Mother   . Hyperlipidemia Mother   . Heart attack Father   . Heart disease Father   . Hypertension Father   . Diabetes Paternal Grandfather   . Heart disease Maternal Aunt   . Breast cancer Maternal Aunt   . Heart disease  Maternal Grandmother   . Cancer Maternal Grandmother     colon   Social History:  History  Alcohol Use No     History  Drug Use No    History   Social History  . Marital Status: Single    Spouse Name: N/A  . Number of Children: 0  . 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 Smoking status: Never Smoker   . Smokeless tobacco: Never Used  . Alcohol Use: No  . Drug Use: No  . Sexual Activity: Yes    Birth Control/ Protection: Pill   Other Topics Concern  . None   Social History Narrative   Additional History:    Sleep: Fair  Appetite:  Fair   Assessment:   Musculoskeletal: Strength &  Muscle Tone: within normal limits Gait & Station: normal Patient leans: N/A   Psychiatric Specialty Exam: Physical Exam  Vitals reviewed. Psychiatric: Her mood appears anxious.    Review of Systems  Psychiatric/Behavioral: The patient is nervous/anxious.   All other systems reviewed and are negative.   Blood pressure 115/75, pulse 77, temperature 98 F (36.7 C), temperature source Oral, resp. rate 16, height 5\' 9"  (1.753 m), weight 105.688 kg (233 lb), last menstrual period 03/29/2014.Body mass index is 34.39 kg/(m^2).  General Appearance: Fairly Groomed  Engineer, water::  Good  Speech:  Normal Rate- not pressured at this time  Volume:  Normal  Mood:  Angry, Depressed and Irritable, today also more irritable, dysphoric   Affect:  Blunt, Depressed, Labile and irritable  Thought Process:  Goal Directed and Linear- no flight of ideations  Orientation:  Full (Time, Place, and Person)  Thought Content:  denies hallucinations, no delusions expressed, no grandiose ideations, focusing on unpleasant state of agitation , restlessness   Suicidal Thoughts:  Yes.  with intent/plan- as above, expressing thoughts of hitting head against the wall, suicidal ruminations  Homicidal Thoughts:  No- has denied any thoughts of hurting anyone else   Memory:  Recent and remote grossly intact   Judgement:  Fair  Insight:  Present  Psychomotor Activity: some restlessness, primarily subjective, but also some pacing noted   Concentration:  Fair  Recall:  [employer] of Knowledge:Good  Language: Good  Akathisia:  Negative  Handed:  Right  AIMS (if indicated):     Assets:  Desire for Improvement Resilience Vocational/Educational  ADL's: currently impaired   Cognition: WNL  Sleep:  Number of Hours: 6.5     Current Medications: Current Facility-Administered Medications  Medication Dose Route Frequency Provider Last Rate Last Dose  . acetaminophen (TYLENOL) tablet 650 mg  650 mg Oral Q6H PRN Patrecia Pour, NP   650 mg at 04/18/14 1555  . alum & mag hydroxide-simeth (MAALOX/MYLANTA) 200-200-20 MG/5ML suspension 30 mL  30 mL Oral Q4H PRN Patrecia Pour, NP      . divalproex (DEPAKOTE ER) 24 hr tablet 1,000 mg  1,000 mg Oral QHS Patrecia Pour, NP   1,000 mg at 04/18/14 2222  . divalproex (DEPAKOTE ER) 24 hr tablet 500 mg  500 mg Oral QHS Myer Peer Cobos, MD   500 mg at 04/18/14 2221  . hydrochlorothiazide (HYDRODIURIL) tablet 25 mg  25 mg Oral Daily Patrecia Pour, NP   25 mg at 04/19/14 0755  . lamoTRIgine (LAMICTAL) tablet 200 mg  200 mg Oral QHS Patrecia Pour, NP   200 mg  at 04/18/14 2221  . LORazepam (ATIVAN) tablet 1 mg  1 mg Oral 3 times per day Jenne Campus, MD   1 mg at 04/19/14 1219  . lurasidone (LATUDA) tablet 40 mg  40 mg Oral q1800 Jenne Campus, MD   40 mg at 04/18/14 1814  . magnesium hydroxide (MILK OF MAGNESIA) suspension 30 mL  30 mL Oral Daily PRN Patrecia Pour, NP      . metoprolol succinate (TOPROL-XL) 24 hr tablet 100 mg  100 mg Oral Daily Patrecia Pour, NP   100 mg at 04/19/14 0755  . multivitamin with minerals tablet 1 tablet  1 tablet Oral Daily Patrecia Pour, NP   1 tablet at 04/19/14 0755  . ondansetron (ZOFRAN-ODT) disintegrating tablet 4 mg  4 mg Oral Q8H PRN Kerrie [place], NP   4 mg at 04/15/14 1516  . traZODone (DESYREL) tablet 100 mg  100 mg Oral QHS PRN Harriet [place], NP   100 mg at 04/18/14 2221    Lab Results:  No results found for this or any previous visit (from the past 48 hour(s)).  Physical Findings: AIMS: Facial and Oral Movements Muscles of Facial Expression: None, normal Lips and Perioral Area: None, normal Jaw: None, normal Tongue: None, normal,Extremity Movements Upper (arms, wrists, hands, fingers): None, normal Lower (legs, knees, ankles, toes): None, normal, Trunk Movements Neck, shoulders, hips: None, normal, Overall Severity Severity of abnormal movements (highest score from questions above): None, normal Incapacitation  due to abnormal movements: None, normal Patient's awareness of abnormal movements (rate only patient's report): No Awareness, Dental Status Current problems with teeth and/or dentures?: No Does patient usually wear dentures?: No  CIWA:    COWS:      Assessment-   Today patient presents with increased agitation and irritability. Some of her symptoms may be related to mixed bipolar episode, personality disorder decompensation, but also may be experiencing some akathisia from latuda, as she has been pacing and makes statement such as that she feels as if " coming out of her own skin" .  Ativan has helped  Partially and she has not been sedated by it. Thus far has tolerated Depakote titration well. There has been no rash ( she is also on Lamictal)  Vitals are stable .  Treatment Plan Summary: Daily contact with patient to assess and evaluate symptoms and progress in treatment, Medication management, Plan continue inpatient treatment and continue medications as below  Increase Depakote ER to 1500 mgrs QHS  Continue Lamictal 200 mgrs QDAY Increase Latuda to 80 mgrs QDAY.  She is extremely anxious, depressed that meds are being changed and rationale not being explained. Increase  Ativan  To 1  mgrs Q 8 hours for anxiety .  Continue 1:1 observation level for safety.  Will increase Trazodone to have dose repeated x 1 if the first dose does not help with sleep.  Also discussed with nursing and patient that one of the Ativan should be taken at bedtime also.  No other changes at this time.  Will continue with current treatment plan.    Medical Decision Making:  Review of Psycho-Social Stressors (1), Review or order clinical lab tests (1), Established Problem, Worsening (2), Review of Last Therapy Session (1) and Review of New Medication or Change in Dosage (2)  Dorthy Hustead, [place], MAY, AGNP-BC 04/19/2014, 12:25 PM

## 2014-04-19 NOTE — Progress Notes (Signed)
Patient ID: [name], female   DOB: 15-Jun-1970, 44 y.o.   MRN: [id number] D: Patient states she slept well even though she did not receive her trazodone last night.  She rates her depression as 7; hopelessness as a 6; anxiety as a 9.  She complains of physical pain, in her shoulder, neck, hips and head.  Patient states her goal is "to nail down the specifics of my current medications, doses, times given and when this regimen started."  Patient states that she would like another provider from the one she saw yesterday.  She also requested a different sitter last night because they would not play cards with her.  Patient did attend morning group.  Her main concern is her medications.  She remains passively suicidal and contracts for safety.  Her 1:1 was discontinued at 850.  She has been encouraged to attend group and go to the cafeteria for meals.   A: Continue to monitor medication management and MD orders.  Safety checks continued every 15 minutes per protocol.  Meet patient 1:1 to discuss concerns and offer encouragment. R: Patient is redirectable.

## 2014-04-19 NOTE — Progress Notes (Signed)
[facility] 1:1 Observation Documentation  For the first (8) hours following discontinuation of 1:1 precautions, a progress note entry by nursing staff should be documented at least every 2 hours, reflecting the patient's behavior, condition, mood, and conversation.  Use the progress notes for additional entries.  Time 1:1 discontinued:  0850   Patient's Behavior:  Patient in room playing cards with sitter.  Expressed her desire to come off 1:1 observation.   Patient's Condition:  Patient physical condition is stable.  Mood is anxious and depressed.   Patient's Conversation:  Patient reports passive SI.  She states that she will seek out staff if her thoughts increase to the point she may harm herself.    [name] 04/19/2014, 11:27 AM

## 2014-04-19 NOTE — [facility] Group Notes (Signed)
[facility] Group Notes:  (Nursing/MHT/Case Management/Adjunct)  Date:  04/19/2014  Time:  0900 am  Type of Therapy:  Psychoeducational Skills  Participation Level:  Minimal  Participation Quality:  Appropriate and Attentive  Affect:  Anxious  Cognitive:  Alert  Insight:  Lacking  Engagement in Group:  Lacking  Modes of Intervention:  Support  Summary of Progress/Problems:  [name] 04/19/2014, 12:35 PM

## 2014-04-19 NOTE — [facility] Group Notes (Signed)
[facility] Group Notes:  (Clinical Social Work)  04/19/2014  1:15-2:45pm  Summary of Progress/Problems:   The main focus of today's process group was to   1)  Discuss the importance of adding supports  2)  Experience various pieces of music and discuss feelings evoked  3)  Identify the patient's current healthy supports and plan what to add.  An emphasis was placed on using counselor, doctor, therapy groups, 12-step groups, and problem-specific support groups to expand supports.  Suggestions were generated by group about non-professional supports that could possibly be added, including animals, volunteer work, music, coloring, hobbies, church, etc.  The patient expressed full comprehension of the concepts presented, and agreed that there is a need to add more supports.  The patient stated repeatedly that she has no purpose in living, that there is nobody who would care if she is here or not.  She talked about her mind racing, and being so sleepy yet not being able to sleep.  She received much support from others.  When she did not like what someone was doing, she expressed it without being inappropriate, such as telling another patient to stop rocking in his chair and making thumping noises.  She requested one musical number and appreciated it being played.  Type of Therapy:  Processing Group  Participation Level:  Active  Participation Quality:  Attentive and Sharing  Affect:  Defensive, Flat, Irritable, Resistant and Tearful  Cognitive:  Disorganized  Insight:  Limited  Engagement in Therapy:  Engaged  Modes of Intervention:    Activity and Processing  Selmer Dominion, LCSW 04/19/2014, 3:40pm

## 2014-04-20 ENCOUNTER — Ambulatory Visit: Payer: PPO | Admitting: Physical Therapy

## 2014-04-20 MED ORDER — LORAZEPAM 0.5 MG PO TABS
0.5000 mg | ORAL_TABLET | Freq: Three times a day (TID) | ORAL | Status: DC
  Administered 2014-04-20 – 2014-04-22 (×5): 0.5 mg via ORAL
  Filled 2014-04-20 (×6): qty 1

## 2014-04-20 NOTE — Progress Notes (Signed)
Recreation Therapy Notes  Date: 03.21.2016 Time: 9:30am Location: [address] Group Room   Group Topic: Stress Management  Goal Area(s) Addresses:  Patient will actively participate in stress management techniques presented during session.   Behavioral Response: Did not attend.   Laureen Ochs Abdikadir Fohl, LRT/CTRS  Delfina Schreurs L 04/20/2014 10:14 AM

## 2014-04-20 NOTE — Progress Notes (Signed)
D: Patient complaining of "tiredness, dizziness and mild confusion.  She slept well and her appetite has been good.  She has been interacting more with her peers and attending some groups.  She remains passively suicidal, however, contracts for safety.  She rates her depression as a 7; her hopelessness a 5; anxiety as an 8.  Her goal today is "to work with Dr. Parke Poisson and figure out next steps in the process."  Patient is worried and anxious about her discharge.   A: Continue to monitor medication management and MD orders.  Safety checks completed every 15 minutes per protocol.  Meet 1:1 with patient to discuss concerns and offer encouragement. R: Patient is redirectable.

## 2014-04-20 NOTE — [facility] Group Notes (Signed)
[name] LCSW Aftercare Discharge Planning Group Note   04/20/2014 9:30 AM    Participation Quality:  Appropraite  Mood/Affect:  Appropriate  Depression Rating:  7  Anxiety Rating:  8  Thoughts of Suicide:  No  Will you contract for safety?   NA  Current AVH:  No  Plan for Discharge/Comments:  Patient attended discharge planning group and actively participated in group. She reports being groggy and a bit disoriented due to medication changes.  Patient will follow up with Dr. Caprice Beaver for outpatient services. Suicide prevention education reviewed and SPE document provided.   Transportation Means: Patient has transportation.   Supports:  Patient has a support system.   Lillyann Ahart, [place] [facility]

## 2014-04-20 NOTE — Plan of Care (Signed)
Problem: Diagnosis: Increased Risk For Suicide Attempt Goal: STG-Patient Will Attend All Groups On The Unit Outcome: Progressing Patient did not attend evening group on 04/19/2014.

## 2014-04-20 NOTE — Progress Notes (Signed)
Writer entered patients room earlier and observed her lying in bed resting, she was encouraged to attend group but patient declined. She reported that she was exhausted from the day and just needed to rest. Writer praised her for coming off her 1:1. She came to medication window and received her hs meds and got a snack before returning to her room to rest. Support and encouragement given, safety maintained on unit with 15 min checks.

## 2014-04-20 NOTE — Progress Notes (Signed)
D: Patient in the dayroom on approach.  Patient states she had a good day.  Patient states she has been drowsy today.  Patient states she has had some medication changes.  Patient states she is passive SI but verbally contracts for safety.  Patient denies HI/AVH. A: Staff to monitor Q 15 mins for safety.  Encouragement and support offered.  Scheduled medications administered per orders. R: Patient remains safe on the unit.  Patient attended group tonight.  Patient visible on the unit and interacting with peers.  Patient taking administered medications.

## 2014-04-20 NOTE — Progress Notes (Signed)
Patient ID: [name], female   DOB: 08-Sep-1970, 44 y.o.   MRN: [id number] [facility] MD Progress Note  04/20/2014 6:00 PM [name]  MRN:  [id number] Subjective:   Patient reports feeling vaguely sedated and overmedicated. She continues to feel subjectively agitated often, but at this time attributes mostly to unpleasant feeling of being sedated/slowed. She does acknowledge some improvement compared to her admission status.,  Objective :  I have discussed case with treatment team and have met with patient. Patient is somewhat improved, and is no longer having active suicidal or self injurious ideations, and states she no longer feels like hitting her head against a wall. She is now off one to one observation and has had  No self injurious or disruptive behaviors. She has been going to some groups. Today she focuses on feeling vaguely sedated, sleepy, and over medicated. This may be related to Ativan, which has been helpful thus far in decreasing her   Agitation .   She remains anxious, but is more future oriented, for example worrying about whether she needs to reschedule an appointment she has with her outpatient psychiatrist later this week or not.    Principal Problem:Bipolar Disorder Mixed  Diagnosis:   Patient Active Problem List   Diagnosis Date Noted  . Bipolar I disorder, most recent episode mixed [F31.60] 04/18/2014  . Bipolar affective disorder, depressed, severe [F31.4] 04/12/2014  . Suicidal ideation [R45.851] 04/12/2014  . Injury of right shoulder and upper arm [S49.91XA] 02/17/2014  . Left leg pain [M79.605] 06/03/2013  . Right hip pain [M25.551] 06/03/2013  . Migraine with status migrainosus [G43.901] 01/08/2013  . Personality disorder [F60.9]   . Chronic migraine [G43.709] 05/08/2012  . Contact dermatitis [L25.9] 11/27/2011  . Major depressive disorder, recurrent episode [F33.9] 10/27/2011  . Generalized anxiety disorder [F41.1] 10/27/2011  . 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 Borderline personality disorder [F60.3] 10/27/2011  . Right foot pain [M79.671] 09/28/2011  . 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 Ganglion cyst [M67.40] 09/29/2009  . 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 BIPOLAR DISORDER UNSPECIFIED [F31.9] 06/09/2008   Total Time spent with patient: 20 minutes   Past Medical History:  Past Medical History  Diagnosis Date  . Bipolar disorder   . Eczema   . Pes planus   . Achilles tendinitis   . Hypertension   . Migraine   . Achilles tendinitis   . Ganglion cyst 09/29/2009    left wrist (2 cyst)  . Arthritis   . Allergy   . Lipoma   . ADHD (attention deficit hyperactivity disorder)   . Hyperprolactinemia   . Bipolar affective   . Personality disorder     Past Surgical History  Procedure Laterality Date  . Tumor resection left thigh    . Ankle surgery  12/88    left   . Lipoma removal    . Ganglion cyst excision  2011  . Chest nodule  1990?    rt chest wall nodule removal  . Shoulder surgery  01/13/2011    right, partial tear  . Right bunioectomy     Family History:  Family History  Problem Relation Age of Onset  . Hypertension Mother   . Hyperlipidemia Mother   . Heart attack Father   . Heart disease Father   . Hypertension Father   . Diabetes Paternal Grandfather   . Heart disease Maternal  Aunt   . Breast cancer Maternal Aunt   . Heart disease Maternal Grandmother   . Cancer Maternal Grandmother     colon   Social History:  History  Alcohol Use No     History  Drug Use No    History   Social History  . Marital Status: Single    Spouse Name: N/A  . Number of Children: 0  . 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 Smoking status: Never Smoker   . Smokeless tobacco: Never Used  . Alcohol Use: No  . Drug Use: No  . Sexual Activity:  Yes    Birth Control/ Protection: Pill   Other Topics Concern  . None   Social History Narrative   Additional History:    Sleep:improved   Appetite:  Improved   Assessment:   Musculoskeletal: Strength & Muscle Tone: within normal limits Gait & Station: normal Patient leans: N/A   Psychiatric Specialty Exam: Physical Exam  Vitals reviewed. Psychiatric: Her mood appears anxious.    Review of Systems  Constitutional: Negative for fever and chills.  Respiratory: Negative for cough and shortness of breath.   Cardiovascular: Negative for chest pain.  Gastrointestinal: Negative for vomiting.  Skin: Negative for rash.  Neurological: Negative for headaches.  Psychiatric/Behavioral: Positive for depression. The patient is nervous/anxious.     Blood pressure 117/71, pulse 84, temperature 98.2 F (36.8 C), temperature source Oral, resp. rate 18, height 5' 9"  (1.753 m), weight 233 lb (105.688 kg), last menstrual period 03/29/2014.Body mass index is 34.39 kg/(m^2).  General Appearance: improved grooming  Eye Contact::  Good  Speech:  Normal Rate- not pressured at this time  Volume:  Normal  Mood:  remains vaguely depressed, dysphoric, but improving, today also more irritable, dysphoric   Affect:  less labile   Thought Process:  Goal Directed and Linear- no flight of ideations  Orientation:  Full (Time, Place, and Person)  Thought Content:  denies hallucinations, no delusions expressed, no grandiose ideations  Suicidal Thoughts:  No-  Today denying any self injurious ideations and able to contract for safety on unit   Homicidal Thoughts:  No- has denied any thoughts of hurting anyone else   Memory:  Recent and remote grossly intact   Judgement:  Fair  Insight:  Present  Psychomotor Activity: normal  Concentration:  Fair  Recall:  [employer] of Knowledge:Good  Language: Good  Akathisia:  Negative  Handed:  Right  AIMS (if indicated):     Assets:  Desire for  Improvement Resilience Vocational/Educational  ADL's: currently impaired   Cognition: WNL  Sleep:  Number of Hours: 6.5     Current Medications: Current Facility-Administered Medications  Medication Dose Route Frequency Provider Last Rate Last Dose  . acetaminophen (TYLENOL) tablet 650 mg  650 mg Oral Q6H PRN Patrecia Pour, NP   650 mg at 04/20/14 0526  . alum & mag hydroxide-simeth (MAALOX/MYLANTA) 200-200-20 MG/5ML suspension 30 mL  30 mL Oral Q4H PRN Patrecia Pour, NP      . divalproex (DEPAKOTE ER) 24 hr tablet 1,000 mg  1,000 mg Oral QHS Patrecia Pour, NP   1,000 mg at 04/19/14 2118  . divalproex (DEPAKOTE ER) 24 hr tablet 500 mg  500 mg Oral QHS Myer Peer Akbar Sacra, MD   500 mg at 04/19/14 2117  . hydrochlorothiazide (HYDRODIURIL) tablet 25 mg  25 mg Oral Daily Theodoro Clock  Leander Rams, NP   25 mg at 04/20/14 0843  . lamoTRIgine (LAMICTAL) tablet 200 mg  200 mg Oral QHS Patrecia Pour, NP   200 mg at 04/19/14 2117  . LORazepam (ATIVAN) tablet 1 mg  1 mg Oral 3 times per day Jenne Campus, MD   1 mg at 04/20/14 1502  . lurasidone (LATUDA) tablet 80 mg  80 mg Oral q1800 Kerrie Buffalo, NP   80 mg at 04/19/14 1724  . magnesium hydroxide (MILK OF MAGNESIA) suspension 30 mL  30 mL Oral Daily PRN Patrecia Pour, NP      . metoprolol succinate (TOPROL-XL) 24 hr tablet 100 mg  100 mg Oral Daily Patrecia Pour, NP   100 mg at 04/20/14 0843  . multivitamin with minerals tablet 1 tablet  1 tablet Oral Daily Patrecia Pour, NP   1 tablet at 04/20/14 [phone]  . ondansetron (ZOFRAN-ODT) disintegrating tablet 4 mg  4 mg Oral Q8H PRN Kerrie Buffalo, NP   4 mg at 04/15/14 1516  . traZODone (DESYREL) tablet 100 mg  100 mg Oral QHS PRN Harriet Butte, NP   100 mg at 04/19/14 2117    Lab Results:  No results found for this or any previous visit (from the past 48 hour(s)).  Physical Findings: AIMS: Facial and Oral Movements Muscles of Facial Expression: None, normal Lips and Perioral Area: None,  normal Jaw: None, normal Tongue: None, normal,Extremity Movements Upper (arms, wrists, hands, fingers): None, normal Lower (legs, knees, ankles, toes): None, normal, Trunk Movements Neck, shoulders, hips: None, normal, Overall Severity Severity of abnormal movements (highest score from questions above): None, normal Incapacitation due to abnormal movements: None, normal Patient's awareness of abnormal movements (rate only patient's report): No Awareness, Dental Status Current problems with teeth and/or dentures?: No Does patient usually wear dentures?: No  CIWA:    COWS:      Assessment-  Patient is gradually improving and is less irritable , less agitated. She remains anxious and ruminative, but is more future/ disposition plan oriented. She is expressing a sense of some sedation, drowsiness, although presents alert and attentive and oriented x 3. This may be related to Ativan , which was titrated to address significant agitation and irritability, currently improving.   Treatment Plan Summary: Daily contact with patient to assess and evaluate symptoms and progress in treatment, Medication management, Plan continue inpatient treatment and continue medications as below  Continue Depakote ER  1500 mgrs QHS  Continue Lamictal 200 mgrs QDAY Continue Latuda 80 mgrs QDAY.  She is extremely anxious, depressed that meds are being changed and rationale not being explained. As she is improving decrease  Ativan  To  0.5  mgrs Q 8 hours for anxiety .  Obtain routine LFTS, CBC , Valproic Acid Serum level     Medical Decision Making:  Review of Psycho-Social Stressors (1), Review or order clinical lab tests (1), Established Problem, Worsening (2), Review of Last Therapy Session (1) and Review of New Medication or Change in Dosage (2)  Rudolph Dobler, 04/20/2014, 6:00 PM

## 2014-04-20 NOTE — Plan of Care (Signed)
Problem: Diagnosis: Increased Risk For Suicide Attempt Goal: STG-Patient Will Comply With Medication Regime Outcome: Progressing Patient is compliant with scheduled medications.     

## 2014-04-20 NOTE — [facility] Group Notes (Signed)
[place] LCSW Group Therapy  Overcoming Obstacle 1:15 - 2:30 PM  04/20/2014 2:51 PM  Type of Therapy:  Group Therapy  Participation Level:  Did Not Attend - patient shared she attended group on topic last week.   [name] 04/20/2014, 2:51 PM

## 2014-04-21 MED ORDER — SERTRALINE HCL 25 MG PO TABS
25.0000 mg | ORAL_TABLET | Freq: Every day | ORAL | Status: DC
  Administered 2014-04-22: 25 mg via ORAL
  Filled 2014-04-21 (×3): qty 1

## 2014-04-21 MED ORDER — SERTRALINE HCL 50 MG PO TABS
50.0000 mg | ORAL_TABLET | Freq: Every day | ORAL | Status: DC
  Administered 2014-04-21: 50 mg via ORAL
  Filled 2014-04-21 (×4): qty 1

## 2014-04-21 NOTE — Progress Notes (Signed)
D: Patient is alert and oriented. Pt's mood and affect is depressed, irritable at times, and flat. Pt is tearful at times. Pt's speech is tangential at times. Pt denies HI and AVH. Pt reports passive suicidal thoughts today stating "I don't feel like I'm worth your time." Pt rates her depression 7/10, hopelessness 6/10, and anxiety 8/10. Pt is concerned that she did not get her blood drawn this morning. Pt is demanding at times. Pt is attending groups. Pt complains of headache this afternoon 7/10, which decreased with PRN medication.  A: Agricultural consultant, contacted lab, blood draw order needs to be re-done, will notify MD. Active listening by RN. Encouragement/Support provided to pt. PRN medication administered for pain per providers orders (See MAR). Scheduled medications administered per providers orders (See MAR). 15 minute checks continued per protocol for patient safety.  R: Pt verbally agrees not to harm self and to come to staff with increased intensity of suicidal thoughts. Patient cooperative and receptive to nursing interventions. Pt remains safe.

## 2014-04-21 NOTE — Tx Team (Signed)
Interdisciplinary Treatment Plan Update   Date Reviewed:  04/21/2014  Time Reviewed:  8:33 AM  Progress in Treatment:   Attending groups: Yes, patient is attending groups. Participating in groups: Yes, engages in group discussion. Taking medication as prescribed: Yes  Tolerating medication: Yes Family/Significant other contact made: Yes, collateral contact with mother. Patient understands diagnosis: Yes, patient understands diagnosis and need for treatment. Discussing patient identified problems/goals with staff: Yes, patient is able to express goals for treatment and discharge. Medical problems stabilized or resolved: Yes Denies suicidal/homicidal ideation: Yes Patient has not harmed self or others: Yes  For review of initial/current patient goals, please see plan of care.  Estimated Length of Stay:  2-3 days  Reasons for Continued Hospitalization:  Anxiety Depression Medication stabilization  New Problems/Goals identified:    Discharge Plan or Barriers:   Home with outpatient follow up with Letta Moynahan and Clint Bolder at [place]  Additional Comments:  Continue medication stabilizaton  Patient and CSW reviewed patient's identified goals and treatment plan.  Patient verbalized understanding and agreed to treatment plan.   Attendees:  Patient:  04/21/2014 8:33 AM   Signature:  Gabriel Earing, MD 04/21/2014 8:33 AM  Signature:  04/21/2014 8:33 AM  Signature:  Hester Mates,  RN 04/21/2014 8:33 AM  Signature: Barbie Haggis, RN 04/21/2014 8:33 AM  Signature:  Cyril Mourning Drinkard, RN 04/21/2014 8:33 AM  Signature:  Joette Catching, LCSW 04/21/2014 8:33 AM  Signature: Maxie Better, LCSW-A 04/21/2014 8:33 AM  Signature:   04/21/2014 8:33 AM  Signature:   04/21/2014 8:33 AM  Signature:  04/21/2014  8:33 AM  Signature:   Lars Pinks, RN [facility] 04/21/2014  8:33 AM  Signature:   04/21/2014  8:33 AM    Scribe for Treatment Team:   Joette Catching,  04/21/2014 8:33 AM

## 2014-04-21 NOTE — [facility] Group Notes (Signed)
[facility] LCSW Group Therapy  04/21/2014 3:13 PM  Type of Therapy:  Group Therapy  Participation Level:  Did Not Attend  [name] 04/21/2014, 3:13 PM

## 2014-04-21 NOTE — Progress Notes (Signed)
Patient ID: [name], female   DOB: 20-Aug-1970, 44 y.o.   MRN: [id number] [facility] MD Progress Note  04/21/2014 6:36 PM [place] Dalporto Cavanaugh  MRN:  [id number] Subjective:   She reports she feels less sedated today, but remains depressed. She ruminates about her depression and her functional limitations. She denies medication side effects, and does state medications may be helping albeit partially.  Objective :  I have discussed case with treatment team and have met with patient. Remains depressed and intermittently tearful, tends to ruminate about being chronically mentally ill, " not being normal", " not being able to work like everyone else". She is more responsive to support , empathy, and review of her ego strengths and coping skills than she had been . 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 Visible on unit, going to groups, at times vaguely irritable, but no overt disruptive  Behaviors. Denies medication side effects.    Principal Problem:Bipolar Disorder Mixed  Diagnosis:   Patient Active Problem List   Diagnosis Date Noted  . Bipolar I disorder, most recent episode mixed [F31.60] 04/18/2014  . Bipolar affective disorder, depressed, severe [F31.4] 04/12/2014  . Suicidal ideation [R45.851] 04/12/2014  . Injury of right shoulder and upper arm [S49.91XA] 02/17/2014  . Left leg pain [M79.605] 06/03/2013  . Right hip pain [M25.551] 06/03/2013  . Migraine with status migrainosus [G43.901] 01/08/2013  . Personality disorder [F60.9]   . Chronic migraine [G43.709] 05/08/2012  . Contact dermatitis [L25.9] 11/27/2011  . Major depressive disorder, recurrent episode [F33.9] 10/27/2011  . Generalized anxiety disorder [F41.1] 10/27/2011  . 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 Borderline  personality disorder [F60.3] 10/27/2011  . Right foot pain [M79.671] 09/28/2011  . 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 Ganglion cyst [M67.40] 09/29/2009  . 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 BIPOLAR DISORDER UNSPECIFIED [F31.9] 06/09/2008   Total Time spent with patient: 20 minutes   Past Medical History:  Past Medical History  Diagnosis Date  . Bipolar disorder   . Eczema   . Pes planus   . Achilles tendinitis   . Hypertension   . Migraine   . Achilles tendinitis   . Ganglion cyst 09/29/2009    left wrist (2 cyst)  . Arthritis   . Allergy   . Lipoma   . ADHD (attention deficit hyperactivity disorder)   . Hyperprolactinemia   . Bipolar affective   . Personality disorder     Past Surgical History  Procedure Laterality Date  . Tumor resection left thigh    . Ankle surgery  12/88    left   . Lipoma removal    . Ganglion cyst excision  2011  . Chest nodule  1990?    rt chest wall nodule removal  . Shoulder surgery  01/13/2011    right, partial tear  . Right bunioectomy     Family History:  Family History  Problem Relation Age of Onset  . Hypertension Mother   . Hyperlipidemia Mother   . Heart attack Father   . Heart disease Father   . Hypertension Father   . Diabetes Paternal Grandfather   . Heart disease Maternal Aunt   . Breast cancer Maternal Aunt   . Heart disease Maternal Grandmother   .  Cancer Maternal Grandmother     colon   Social History:  History  Alcohol Use No     History  Drug Use No    History   Social History  . Marital Status: Single    Spouse Name: N/A  . Number of Children: 0  . 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 Smoking status: Never Smoker   . Smokeless tobacco: Never Used  . Alcohol Use: No  . Drug Use: No  . Sexual Activity: Yes    Birth Control/ Protection: Pill   Other Topics Concern  . None    Social History Narrative   Additional History:    Sleep:improved   Appetite:  Improved   Assessment:   Musculoskeletal: Strength & Muscle Tone: within normal limits Gait & Station: normal Patient leans: N/A   Psychiatric Specialty Exam: Physical Exam  Vitals reviewed. Psychiatric: Her mood appears anxious.    Review of Systems  Constitutional: Negative for fever and chills.  Respiratory: Negative for cough and shortness of breath.   Cardiovascular: Negative for chest pain.  Gastrointestinal: Negative for vomiting.  Skin: Negative for rash.  Psychiatric/Behavioral: Positive for depression.    Blood pressure 111/89, pulse 82, temperature 98.1 F (36.7 C), temperature source Oral, resp. rate 20, height 5' 9"  (1.753 m), weight 233 lb (105.688 kg), last menstrual period 03/29/2014.Body mass index is 34.39 kg/(m^2).  General Appearance: improved grooming  Eye Contact::  Good  Speech:  Normal Rate-   Volume:  Normal  Mood:  Depressed and variable,  But improved compared to admission,    Affect:  constricted, but does smile at times appropriately  Thought Process:  Goal Directed and Linear- no flight of ideations  Orientation:  Full (Time, Place, and Person)  Thought Content:  denies hallucinations, no delusions expressed, no grandiose ideations  Suicidal Thoughts:  Yes.  without intent/plan-  , describes some passive SI, but denies any self injurious ideations and able to contract for safety on unit   Homicidal Thoughts:  No- has denied any thoughts of hurting anyone else   Memory:  Recent and remote grossly intact   Judgement:  Fair  Insight:  Present  Psychomotor Activity: normal  Concentration:  Fair  Recall:  [employer] of Knowledge:Good  Language: Good  Akathisia:  Negative  Handed:  Right  AIMS (if indicated):     Assets:  Desire for Improvement Resilience Vocational/Educational  ADL's: currently impaired   Cognition: WNL  Sleep:  Number of Hours: 6      Current Medications: Current Facility-Administered Medications  Medication Dose Route Frequency Provider Last Rate Last Dose  . acetaminophen (TYLENOL) tablet 650 mg  650 mg Oral Q6H PRN Patrecia Pour, NP   650 mg at 04/21/14 1408  . alum & mag hydroxide-simeth (MAALOX/MYLANTA) 200-200-20 MG/5ML suspension 30 mL  30 mL Oral Q4H PRN Patrecia Pour, NP      . divalproex (DEPAKOTE ER) 24 hr tablet 1,000 mg  1,000 mg Oral QHS Patrecia Pour, NP   1,000 mg at 04/20/14 2143  . divalproex (DEPAKOTE ER) 24 hr tablet 500 mg  500 mg Oral QHS Jenne Campus, MD   500 mg at 04/20/14 2143  . hydrochlorothiazide (HYDRODIURIL) tablet 25 mg  25 mg Oral Daily Patrecia Pour, NP   25 mg at 04/21/14 0732  . lamoTRIgine (LAMICTAL) tablet 200 mg  200 mg Oral  QHS Patrecia Pour, NP   200 mg at 04/20/14 2143  . LORazepam (ATIVAN) tablet 0.5 mg  0.5 mg Oral 3 times per day Jenne Campus, MD   0.5 mg at 04/21/14 1407  . lurasidone (LATUDA) tablet 80 mg  80 mg Oral q1800 Kerrie Buffalo, NP   80 mg at 04/21/14 1817  . magnesium hydroxide (MILK OF MAGNESIA) suspension 30 mL  30 mL Oral Daily PRN Patrecia Pour, NP      . metoprolol succinate (TOPROL-XL) 24 hr tablet 100 mg  100 mg Oral Daily Patrecia Pour, NP   100 mg at 04/21/14 0831  . multivitamin with minerals tablet 1 tablet  1 tablet Oral Daily Patrecia Pour, NP   1 tablet at 04/21/14 0732  . ondansetron (ZOFRAN-ODT) disintegrating tablet 4 mg  4 mg Oral Q8H PRN Kerrie Buffalo, NP   4 mg at 04/15/14 1516  . sertraline (ZOLOFT) tablet 50 mg  50 mg Oral Daily Jenne Campus, MD   50 mg at 04/21/14 1204  . traZODone (DESYREL) tablet 100 mg  100 mg Oral QHS PRN Harriet Butte, NP   100 mg at 04/20/14 2143    Lab Results:  No results found for this or any previous visit (from the past 48 hour(s)).  Physical Findings: AIMS: Facial and Oral Movements Muscles of Facial Expression: None, normal Lips and Perioral Area: None, normal Jaw: None,  normal Tongue: None, normal,Extremity Movements Upper (arms, wrists, hands, fingers): None, normal Lower (legs, knees, ankles, toes): None, normal, Trunk Movements Neck, shoulders, hips: None, normal, Overall Severity Severity of abnormal movements (highest score from questions above): None, normal Incapacitation due to abnormal movements: None, normal Patient's awareness of abnormal movements (rate only patient's report): No Awareness, Dental Status Current problems with teeth and/or dentures?: No Does patient usually wear dentures?: No  CIWA:    COWS:      Assessment-  Patient continues to report  Depression, and remains ruminative about losses, difficulties. Affect is more reactive, less irritable, and behavior on unit is calm, not agitated. She is tolerating medications well. We discussed options, such as adding antidepressant- patient aware that antidepressant management could increase risk of switch to a hypomanic state, or changing latuda ( most recent medication she has been on ) with another medication. She states did not tolerate Geodon well, and refuses to consider medications likely to cause weight gain, such as Zyprexa or Seroquel. She is less dizzy, drowsy on lower BZD dose .  Agrees to Zoloft trial.  Treatment Plan Summary: Daily contact with patient to assess and evaluate symptoms and progress in treatment, Medication management, Plan continue inpatient treatment and continue medications as below  Continue Depakote ER  1500 mgrs QHS  Continue Lamictal 200 mgrs QDAY Continue Latuda 80 mgrs QDAY.  She is extremely anxious, depressed that meds are being changed and rationale not being explained. Continue Ativan 0.5  mgrs Q 8 hours for anxiety .  Start Zoloft 25 mgrs QAM. Asked by Nursing staff to re-order routine LFTS, CBC , Valproic Acid Serum level     Medical Decision Making:  Review of Psycho-Social Stressors (1), Review or order clinical lab tests (1), Established  Problem, Worsening (2), Review of Last Therapy Session (1) and Review of New Medication or Change in Dosage (2)  Declynn Lopresti, 04/21/2014, 6:36 PM

## 2014-04-21 NOTE — Progress Notes (Signed)
D: Patient in the dayroom on approach.  Patient states she continues to be groggy.  Patient states she had a a visit from a friend today.  Patient states she had crying spells this morning but states it got better as the day went on.  Patient states she is passive SI but verbally contracts for safety.  Patient denies HI and denies AVH. A: Staff to monitor Q 15 mins for safety.  Encouragement and support offered.  Scheduled medications administered per orders.   R: Patient remains safe on the unit.  Patient attended group tonight.  Patient visible on the unit and interacting with peers.  Patient taking administered medications.

## 2014-04-21 NOTE — [facility] Group Notes (Signed)
[facility] Group Notes:  (Nursing/MHT/Case Management/Adjunct)  Date:  04/21/2014  Time:  0900am  Type of Therapy:  Nurse Education  Participation Level:  Active  Participation Quality:  Appropriate and Attentive  Affect:  Depressed  Cognitive:  Alert and Appropriate  Insight:  Appropriate and Good  Engagement in Group:  Engaged  Modes of Intervention:  Discussion, Education and Support  Summary of Progress/Problems: Patient attended group, remained engaged, and responded appropriately when prompted. Pt reports her goal for the day is "assess where I am, how close to discharge." Pt is requesting IOP.  Charlyne Quale A 04/21/2014, 10:34 AM

## 2014-04-22 ENCOUNTER — Ambulatory Visit: Payer: Self-pay | Admitting: Psychology

## 2014-04-22 ENCOUNTER — Ambulatory Visit: Payer: Self-pay | Admitting: Family Medicine

## 2014-04-22 LAB — CBC WITH DIFFERENTIAL/PLATELET
Basophils Absolute: 0.1 10*3/uL (ref 0.0–0.1)
Basophils Relative: 1 % (ref 0–1)
Eosinophils Absolute: 0.2 10*3/uL (ref 0.0–0.7)
Eosinophils Relative: 3 % (ref 0–5)
HCT: 40 % (ref 36.0–46.0)
Hemoglobin: 13.2 g/dL (ref 12.0–15.0)
Lymphocytes Relative: 42 % (ref 12–46)
Lymphs Abs: 2.8 10*3/uL (ref 0.7–4.0)
MCH: 29.9 pg (ref 26.0–34.0)
MCHC: 33 g/dL (ref 30.0–36.0)
MCV: 90.7 fL (ref 78.0–100.0)
Monocytes Absolute: 0.8 10*3/uL (ref 0.1–1.0)
Monocytes Relative: 12 % (ref 3–12)
Neutro Abs: 2.9 10*3/uL (ref 1.7–7.7)
Neutrophils Relative %: 42 % — ABNORMAL LOW (ref 43–77)
Platelets: 199 10*3/uL (ref 150–400)
RBC: 4.41 MIL/uL (ref 3.87–5.11)
RDW: 12.7 % (ref 11.5–15.5)
WBC: 6.7 10*3/uL (ref 4.0–10.5)

## 2014-04-22 LAB — HEPATIC FUNCTION PANEL
ALT: 13 U/L (ref 0–35)
AST: 15 U/L (ref 0–37)
Albumin: 3.5 g/dL (ref 3.5–5.2)
Alkaline Phosphatase: 48 U/L (ref 39–117)
Bilirubin, Direct: 0.1 mg/dL (ref 0.0–0.5)
Indirect Bilirubin: 0.3 mg/dL (ref 0.3–0.9)
Total Bilirubin: 0.4 mg/dL (ref 0.3–1.2)
Total Protein: 5.6 g/dL — ABNORMAL LOW (ref 6.0–8.3)

## 2014-04-22 LAB — VALPROIC ACID LEVEL: Valproic Acid Lvl: 85.3 ug/mL (ref 50.0–100.0)

## 2014-04-22 MED ORDER — LORAZEPAM 0.5 MG PO TABS
0.5000 mg | ORAL_TABLET | Freq: Two times a day (BID) | ORAL | Status: DC
  Administered 2014-04-23: 0.5 mg via ORAL
  Filled 2014-04-22: qty 1

## 2014-04-22 MED ORDER — SERTRALINE HCL 50 MG PO TABS
50.0000 mg | ORAL_TABLET | Freq: Every day | ORAL | Status: DC
  Administered 2014-04-23 – 2014-04-24 (×2): 50 mg via ORAL
  Filled 2014-04-22 (×2): qty 1
  Filled 2014-04-22 (×2): qty 3
  Filled 2014-04-22: qty 1

## 2014-04-22 NOTE — Progress Notes (Addendum)
Pt is in the dayroom with the other pts. She stated she has to get away when the noise level gets to high. Pt rates her depression a 6/10 and her anxiety a 7/10.She would like to confirm lab results and figure out a date for discharge. Pt would like to try to attend more groups and interact with the other pts more. Pt does have slight pain in her head and shoulder and will be given tylenol if needed. Pt does contract for safety and denies SI and HI,. Pt s BP running on the low side however consulted with pharmacy and pt and pt has been on these medications for a long time. She does not appear to have any side affects. Pt felt nauseated and did not want to go to dinner. At times she appears very needy.

## 2014-04-22 NOTE — Progress Notes (Signed)
Recreation Therapy Notes  Date: 03.23.2016 Time: 9:30am Location: [address] Dayroom   Group Topic: Stress Management  Goal Area(s) Addresses:  Patient will actively participate in stress management techniques presented during session.   Behavioral Response: Did not attend.   Laureen Ochs Labib Cwynar, LRT/CTRS        Raj Landress L 04/22/2014 4:08 PM

## 2014-04-22 NOTE — Progress Notes (Signed)
D: Patient in the dayroom on approach.  Patient states she had a better day.  Patient states when she was finding herself getting upset today she used coping skills and she felt better.  Patient states she is concerned that she will not have a job when she is discharged.  Patient states she is passive SI but states it is almost none.  Patient verbally contracts for safety.  Patient denies HI and denies AVH.   A: Staff to monitor Q 15 mins for safety.  Encouragement and support offered.  Scheduled medications administered per orders.  Trazodone administered prn for sleep. R: Patient remains safe on the unit.  Patient attended group tonight.  Patient visible on the unit and interacting with peers.  Patient taking administered medications.

## 2014-04-22 NOTE — [facility] Group Notes (Signed)
[facility] LCSW Group Therapy  Emotional Regulation 1:15 - 2: 30 PM        04/22/2014     Type of Therapy:  Group Therapy  Participation Level:  Appropriate  Participation Quality:  Appropriate  Affect:  Irritable, Agitated  Cognitive:  Appropriate  Insight:  Developing/Improving  Engagement in Therapy:  Developing/Improving   Modes of Intervention:  Discussion Exploration Problem-Solving Supportive  Summary of Progress/Problems:  Group topic was emotional regulations.  Patient left group after being redirected for calling another patient who was not in group a derogatory name.  She stated other patient was able to make negative statement without being corrected.  CSW explained there was a different group facilitator.   [name] 04/22/2014

## 2014-04-22 NOTE — [facility] Group Notes (Signed)
[facility] LCSW Aftercare Discharge Planning Group Note   04/22/2014 10:42 AM    Participation Quality:  Appropraite  Mood/Affect:  Appropriate  Depression Rating:  6  Anxiety Rating:  7  Thoughts of Suicide:  No  Will you contract for safety?   NA  Current AVH:  No  Plan for Discharge/Comments:  Patient attended discharge planning group and actively participated in group.  She reports feeling better today.  She will follow up with outpatient services as previously noted. Suicide prevention education reviewed and SPE document provided.   Transportation Means: Patient has transportation.   Supports:  Patient has a support system.   Daniela Siebers, Eulas Post

## 2014-04-22 NOTE — Progress Notes (Signed)
Patient ID: [name], female   DOB: [birth year], 44 y.o.   MRN: [id number] [facility] MD Progress Note  04/22/2014 4:27 PM [name]  MRN:  [id number]   Subjective:   Although remains depressed and labile, states that she is feeling better. She is stating she had to leave a group session prematurely due to increased "negativity" from other patients/peers. Denies medication side effects at this time.   Objective :  I have discussed case with treatment team and have met with patient. She is significantly improved today compared to initial and recent presentations. Most noticably she no longer appears as labile and irritable and is more focused on discharge/disposition planning such as whether she should return to work and if so if on a decreased schedule.  Her affect is clearly brighter and less irritable.  This being said, patient does report feeling far from baseline and still depressed, although acknowledging improvement.  She denies medication side effects and states they seem to be working. She is not presenting with any disruptive behaviors on the unit.  Valproic acid level 85.3, hepatic panel unremarkable, and CBC unremarkable.     Principal Problem:Bipolar Disorder Mixed  Diagnosis:   Patient Active Problem List   Diagnosis Date Noted  . Bipolar I disorder, most recent episode mixed [F31.60] 04/18/2014  . Bipolar affective disorder, depressed, severe [F31.4] 04/12/2014  . Suicidal ideation [R45.851] 04/12/2014  . Injury of right shoulder and upper arm [S49.91XA] 02/17/2014  . Left leg pain [M79.605] 06/03/2013  . Right hip pain [M25.551] 06/03/2013  . Migraine with status migrainosus [G43.901] 01/08/2013  . Personality disorder [F60.9]   . Chronic migraine [G43.709] 05/08/2012  . Contact dermatitis [L25.9] 11/27/2011  . Major depressive disorder, recurrent episode [F33.9] 10/27/2011  . Generalized anxiety disorder [F41.1] 10/27/2011  . 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 Borderline personality disorder [F60.3] 10/27/2011  . Right foot pain [M79.671] 09/28/2011  . 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 Ganglion cyst [M67.40] 09/29/2009  . 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 BIPOLAR DISORDER UNSPECIFIED [F31.9] 06/09/2008   Total Time spent with patient: 20 minutes   Past Medical History:  Past Medical History  Diagnosis Date  . Bipolar disorder   . Eczema   . Pes planus   . Achilles tendinitis   . Hypertension   . Migraine   . Achilles tendinitis   . Ganglion cyst 09/29/2009    left wrist (2 cyst)  . Arthritis   . Allergy   . Lipoma   . ADHD (attention deficit hyperactivity disorder)   . Hyperprolactinemia   . Bipolar affective   . Personality disorder     Past Surgical History  Procedure Laterality Date  . Tumor resection left thigh    . Ankle surgery  12/88    left   . Lipoma removal    . Ganglion cyst excision  2011  . Chest nodule  1990?    rt chest wall nodule removal  . Shoulder surgery  01/13/2011    right, partial tear  . Right bunioectomy     Family History:  Family History  Problem Relation Age of Onset  . Hypertension Mother   . Hyperlipidemia Mother   . Heart attack Father   . Heart disease Father   . Hypertension Father   . Diabetes Paternal Grandfather   . Heart disease Maternal Aunt   . Breast cancer  Maternal Aunt   . Heart disease Maternal Grandmother   . Cancer Maternal Grandmother     colon   Social History:  History  Alcohol Use No     History  Drug Use No    History   Social History  . Marital Status: Single    Spouse Name: N/A  . Number of Children: 0  . 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 Smoking status: Never Smoker   . Smokeless tobacco: Never Used  . Alcohol Use: No  . Drug Use: No  . Sexual Activity: Yes    Birth  Control/ Protection: Pill   Other Topics Concern  . None   Social History Narrative   Additional History:    Sleep: Good  Appetite:  Improved   Assessment:   Musculoskeletal: Strength & Muscle Tone: within normal limits Gait & Station: normal Patient leans: N/A   Psychiatric Specialty Exam: Physical Exam  Vitals reviewed. Psychiatric: Her mood appears anxious.    ROS  Blood pressure 114/82, pulse 83, temperature 98.2 F (36.8 C), temperature source Oral, resp. rate 16, height 5' 9"  (1.753 m), weight 233 lb (105.688 kg), last menstrual period 03/29/2014.Body mass index is 34.39 kg/(m^2).  General Appearance: improved grooming  Eye Contact::  Good  Speech:  Normal Rate-   Volume:  Normal  Mood:  Depressed  Affect:  fuller in range  Thought Process:  Goal Directed and Linear- no flight of ideations  Orientation:  Full (Time, Place, and Person)  Thought Content:  denies hallucinations, no delusions expressed, no grandiose ideations  Suicidal Thoughts:  No - denies any thoughts of hurting herself at this time   Homicidal Thoughts:  No- has denied any thoughts of hurting anyone else   Memory:  Recent and remote grossly intact   Judgement:  Fair  Insight:  Present  Psychomotor Activity: normal  Concentration:  Fair  Recall:  [employer] of Knowledge:Good  Language: Good  Akathisia:  Negative  Handed:  Right  AIMS (if indicated):     Assets:  Desire for Improvement Resilience Vocational/Educational  ADL's: currently impaired   Cognition: WNL  Sleep:  Number of Hours: 6     Current Medications: Current Facility-Administered Medications  Medication Dose Route Frequency Provider Last Rate Last Dose  . acetaminophen (TYLENOL) tablet 650 mg  650 mg Oral Q6H PRN Patrecia Pour, NP   650 mg at 04/22/14 1337  . alum & mag hydroxide-simeth (MAALOX/MYLANTA) 200-200-20 MG/5ML suspension 30 mL  30 mL Oral Q4H PRN Patrecia Pour, NP      . divalproex (DEPAKOTE ER) 24 hr  tablet 1,000 mg  1,000 mg Oral QHS Patrecia Pour, NP   1,000 mg at 04/21/14 2216  . divalproex (DEPAKOTE ER) 24 hr tablet 500 mg  500 mg Oral QHS Myer Peer Brookes Craine, MD   500 mg at 04/21/14 2217  . hydrochlorothiazide (HYDRODIURIL) tablet 25 mg  25 mg Oral Daily Patrecia Pour, NP   25 mg at 04/22/14 1224  . lamoTRIgine (LAMICTAL) tablet 200 mg  200 mg Oral QHS Patrecia Pour, NP   200 mg at 04/21/14 2217  . [START ON 04/23/2014] LORazepam (ATIVAN) tablet 0.5 mg  0.5 mg Oral BID Myer Peer Keirston Saephanh, MD      . lurasidone (LATUDA) tablet 80 mg  80 mg Oral q1800 Kerrie Buffalo, NP   80 mg at 04/21/14 1817  .  magnesium hydroxide (MILK OF MAGNESIA) suspension 30 mL  30 mL Oral Daily PRN Patrecia Pour, NP      . metoprolol succinate (TOPROL-XL) 24 hr tablet 100 mg  100 mg Oral Daily Patrecia Pour, NP   100 mg at 04/22/14 8413  . multivitamin with minerals tablet 1 tablet  1 tablet Oral Daily Patrecia Pour, NP   1 tablet at 04/22/14 [phone]  . ondansetron (ZOFRAN-ODT) disintegrating tablet 4 mg  4 mg Oral Q8H PRN Kerrie Buffalo, NP   4 mg at 04/15/14 1516  . [START ON 04/23/2014] sertraline (ZOLOFT) tablet 50 mg  50 mg Oral Daily Jenne Campus, MD      . traZODone (DESYREL) tablet 100 mg  100 mg Oral QHS PRN Harriet Butte, NP   100 mg at 04/20/14 2143    Lab Results:  Results for orders placed or performed during the hospital encounter of 04/12/14 (from the past 48 hour(s))  CBC with Differential/Platelet     Status: Abnormal   Collection Time: 04/22/14  6:30 AM  Result Value Ref Range   WBC 6.7 4.0 - 10.5 K/uL   RBC 4.41 3.87 - 5.11 MIL/uL   Hemoglobin 13.2 12.0 - 15.0 g/dL   HCT 40.0 36.0 - 46.0 %   MCV 90.7 78.0 - 100.0 fL   MCH 29.9 26.0 - 34.0 pg   MCHC 33.0 30.0 - 36.0 g/dL   RDW 12.7 11.5 - 15.5 %   Platelets 199 150 - 400 K/uL   Neutrophils Relative % 42 (L) 43 - 77 %   Neutro Abs 2.9 1.7 - 7.7 K/uL   Lymphocytes Relative 42 12 - 46 %   Lymphs Abs 2.8 0.7 - 4.0 K/uL   Monocytes  Relative 12 3 - 12 %   Monocytes Absolute 0.8 0.1 - 1.0 K/uL   Eosinophils Relative 3 0 - 5 %   Eosinophils Absolute 0.2 0.0 - 0.7 K/uL   Basophils Relative 1 0 - 1 %   Basophils Absolute 0.1 0.0 - 0.1 K/uL    Comment: Performed at [facility]  Hepatic function panel     Status: Abnormal   Collection Time: 04/22/14  6:30 AM  Result Value Ref Range   Total Protein 5.6 (L) 6.0 - 8.3 g/dL   Albumin 3.5 3.5 - 5.2 g/dL   AST 15 0 - 37 U/L   ALT 13 0 - 35 U/L   Alkaline Phosphatase 48 39 - 117 U/L   Total Bilirubin 0.4 0.3 - 1.2 mg/dL   Bilirubin, Direct 0.1 0.0 - 0.5 mg/dL   Indirect Bilirubin 0.3 0.3 - 0.9 mg/dL    Comment: Performed at [facility]  Valproic acid level     Status: None   Collection Time: 04/22/14  6:30 AM  Result Value Ref Range   Valproic Acid Lvl 85.3 50.0 - 100.0 ug/mL    Comment: Performed at [facility]    Physical Findings: AIMS: Facial and Oral Movements Muscles of Facial Expression: None, normal Lips and Perioral Area: None, normal Jaw: None, normal Tongue: None, normal,Extremity Movements Upper (arms, wrists, hands, fingers): None, normal Lower (legs, knees, ankles, toes): None, normal, Trunk Movements Neck, shoulders, hips: None, normal, Overall Severity Severity of abnormal movements (highest score from questions above): None, normal Incapacitation due to abnormal movements: None, normal Patient's awareness of abnormal movements (rate only patient's report): No Awareness, Dental Status Current problems with teeth and/or dentures?: No Does  patient usually wear dentures?: No  CIWA:    COWS:      Assessment-  Patient is improving. She has had a variable presentation from day to day during this admission but current trend does seem to be towards ongoing improvement and stabilization. She is starting to be able to discuss disposition options without severe anxiety or worsening depression. She is tolerating  medications well. Agrees to taper Ativan down as she improves further (does not want to be on Ativan longer term due to habituation potential).  Treatment Plan Summary: Daily contact with patient to assess and evaluate symptoms and progress in treatment, Medication management, Plan continue inpatient treatment and continue medications as below   Continue Depakote ER  1500 mgrs QHS  Continue Lamictal 200 mgrs QDAY Continue Latuda 80 mgrs QDAY.   Decrease Ativan to 0.5  mgrs Q 12 hours for anxiety .  Increase Zoloft to 50 mgrs QAM.    Medical Decision Making:  Review of Psycho-Social Stressors (1), Review or order clinical lab tests (1), Established Problem, Worsening (2), Review of Last Therapy Session (1) and Review of New Medication or Change in Dosage (2)  Katlin Bortner, 04/22/2014, 4:27 PM

## 2014-04-23 MED ORDER — LORAZEPAM 0.5 MG PO TABS
0.5000 mg | ORAL_TABLET | Freq: Once | ORAL | Status: AC
  Administered 2014-04-23: 0.5 mg via ORAL
  Filled 2014-04-23: qty 1

## 2014-04-23 MED ORDER — LORATADINE 10 MG PO TABS
10.0000 mg | ORAL_TABLET | Freq: Every day | ORAL | Status: DC
  Administered 2014-04-23 – 2014-04-24 (×2): 10 mg via ORAL
  Filled 2014-04-23 (×5): qty 1

## 2014-04-23 MED ORDER — LORAZEPAM 0.5 MG PO TABS
0.5000 mg | ORAL_TABLET | Freq: Three times a day (TID) | ORAL | Status: DC
  Administered 2014-04-24: 0.5 mg via ORAL
  Filled 2014-04-23: qty 1

## 2014-04-23 NOTE — Plan of Care (Signed)
Problem: Ineffective individual coping Goal: STG: Patient will remain free from self harm Outcome: Progressing Pt denied SI and verbally contracted for safety. No gestures or incident of self harm to note at present. Safety maintained on Q 15 minutes checks as per order.

## 2014-04-23 NOTE — [facility] Group Notes (Signed)
[place] LCSW Group Therapy  [facility] 1:15 - 2:30 PM  04/23/2014   Type of Therapy:  Group Therapy  Invited, chose not to attend.  Edwyna Shell, LCSW Clinical Social Worker   04/23/2014

## 2014-04-23 NOTE — [facility] Group Notes (Signed)
[place] Group Notes:  (Nursing--Leisure and Lifestyle changes)  Date:  04/23/2014  Time:  0900  Type of Therapy:  Nurse Education  Participation Level:  Active  Participation Quality:  Appropriate, Attentive, Sharing and Supportive  Affect:  Appropriate  Cognitive:  Appropriate  Insight:  Appropriate and Good  Engagement in Group:  Engaged and Supportive  Modes of Intervention:  Discussion, Education, Exploration and Support  Summary of Progress/Problems: Pt was engaged throughout group session.  Meryle Ready, Nicoletta Dress 04/23/2014, 0900

## 2014-04-23 NOTE — [facility] Group Notes (Addendum)
[place] Group Notes:  (Nursing--Leisure and Lifestyle changes)  Date:  04/23/2014  Time:  0900  Type of Therapy:  Nurse Education  Participation Level:  Active  Participation Quality:  Appropriate, Attentive, Sharing and Supportive  Affect:  Appropriate  Cognitive:  Alert and Appropriate  Insight:  Good  Engagement in Group:  Engaged and Supportive  Modes of Intervention:  Discussion  Summary of Progress/Problems: Pt was verbally active in group session. Adult Psychoeducational Group Note  Date:  04/23/2014 Time:  2:19 PM

## 2014-04-23 NOTE — Progress Notes (Signed)
D: Patient in bed on approach.  Patient states she ahd a good day but states she feels she was hyper today.  Patient states she was able to talk to the doctor about her medications and hopes to be discharged tomorrow.  Patient did not want to go to group and she stayed in her room to take a nap.  Patient then got up at 2208 and wanted Ativan.  Patient was told that it was not schedule patient voice got loud and became argumentative with Probation officer.  Writer called Provider on call and no new orders were received.  Patient did voice she was worried about not sleeping but refused her Trazodone.  Patient continued to be argumentative but she did take her Trazodone.  Patient states she has passive SI but states it has become less and less.  Patient denies HI/AVH. A: Staff to monitor Q 15 mins for safety.  Encouragement and support offered.  Scheduled medications administered per orders.  Trazodone administered prn for sleep.   R: Patient remains safe on the unit.  Patient did not attend group tonight.  Patient taking administered medications.  Patient visible on the unit.

## 2014-04-23 NOTE — Progress Notes (Signed)
Patient ID: [name], female   DOB: 08-19-1970, 44 y.o.   MRN: [id number] [facility] MD Progress Note  04/23/2014 4:09 PM Raney Antwine Reierson  MRN:  [id number]   Subjective:   Patient reports ongoing anxiety, and states today she is feeling a " little manic", describes feeling slightly restless, and anxious. Some insomnia. States she was feeling better with higher Ativan dose.   Objective :  I have discussed case with treatment team and have met with patient. Significant improvement compared to admission. She does present with anxiety, which I suspect is related to approaching discharge. She responds well to support, encouragement, and has no suicidal ideations. At this time she is future oriented, and plans to return home - lives with mother- and is very interested in enrolling in the IOP program . She states " I don't think I should return to work until I have completed the IOP, so I can focus on getting better, and not be all stressed about work". Wants a letter documenting her admission , disposition recommendations for work. As noted, she reports some ongoing anxiety, but mood is much improved. She describes feeling  " manic", but symptoms reported are more suggestive to vague feeling of apprehension than to any mania. She is not pressured in speech, not expansive or irritable in thought process, not grandiose, and there is not flight of ideations . Tolerating medications well, as noted, states she feels more anxious with Ativan taper.  No disruptive behaviors on unit. Going to groups.     Principal Problem:Bipolar Disorder Mixed  Diagnosis:   Patient Active Problem List   Diagnosis Date Noted  . Bipolar I disorder, most recent episode mixed [F31.60] 04/18/2014  . Bipolar affective disorder, depressed, severe [F31.4] 04/12/2014  . Suicidal ideation [R45.851] 04/12/2014  . Injury of right shoulder and upper arm [S49.91XA] 02/17/2014  . Left leg pain [M79.605] 06/03/2013  . Right hip pain  [M25.551] 06/03/2013  . Migraine with status migrainosus [G43.901] 01/08/2013  . Personality disorder [F60.9]   . Chronic migraine [G43.709] 05/08/2012  . Contact dermatitis [L25.9] 11/27/2011  . Major depressive disorder, recurrent episode [F33.9] 10/27/2011  . Generalized anxiety disorder [F41.1] 10/27/2011  . 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 Borderline personality disorder [F60.3] 10/27/2011  . Right foot pain [M79.671] 09/28/2011  . 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 Ganglion cyst [M67.40] 09/29/2009  . 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 BIPOLAR DISORDER UNSPECIFIED [F31.9] 06/09/2008   Total Time spent with patient: 20 minutes   Past Medical History:  Past Medical History  Diagnosis Date  . Bipolar disorder   . Eczema   . Pes planus   . Achilles tendinitis   . Hypertension   . Migraine   . Achilles tendinitis   . Ganglion cyst 09/29/2009    left wrist (2 cyst)  . Arthritis   . Allergy   . Lipoma   . ADHD (attention deficit hyperactivity disorder)   . Hyperprolactinemia   . Bipolar affective   . Personality disorder     Past Surgical History  Procedure Laterality Date  . Tumor resection left thigh    . Ankle surgery  12/88    left   . Lipoma removal    . Ganglion cyst excision  2011  . Chest nodule  1990?    rt chest wall nodule removal  . Shoulder surgery  01/13/2011  right, partial tear  . Right bunioectomy     Family History:  Family History  Problem Relation Age of Onset  . Hypertension Mother   . Hyperlipidemia Mother   . Heart attack Father   . Heart disease Father   . Hypertension Father   . Diabetes Paternal Grandfather   . Heart disease Maternal Aunt   . Breast cancer Maternal Aunt   . Heart disease Maternal Grandmother   . Cancer Maternal Grandmother     colon   Social History:  History  Alcohol Use No      History  Drug Use No    History   Social History  . Marital Status: Single    Spouse Name: N/A  . Number of Children: 0  . 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 Smoking status: Never Smoker   . Smokeless tobacco: Never Used  . Alcohol Use: No  . Drug Use: No  . Sexual Activity: Yes    Birth Control/ Protection: Pill   Other Topics Concern  . None   Social History Narrative   Additional History:    Sleep: fair   Appetite:  Improved   Assessment:   Musculoskeletal: Strength & Muscle Tone: within normal limits Gait & Station: normal Patient leans: N/A   Psychiatric Specialty Exam: Physical Exam  Vitals reviewed. Psychiatric: Her mood appears anxious.    ROS headaches ( migraines) , no chest apin, no shortness of breath, no abdominal pain, no rash, no fever, no chills   Blood pressure 120/78, pulse 76, temperature 97.9 F (36.6 C), temperature source Oral, resp. rate 18, height 5' 9" (1.753 m), weight 233 lb (105.688 kg), last menstrual period 03/29/2014.Body mass index is 34.39 kg/(m^2).  General Appearance: improved grooming  Eye Contact::  Good  Speech:  Normal Rate-   Volume:  Normal  Mood:  Improved , less depressed , more reactive affect   Affect:  fuller in range, anxious   Thought Process:  Goal Directed and Linear- no flight of ideations  Orientation:  Full (Time, Place, and Person)  Thought Content:  denies hallucinations, no delusions expressed, no grandiose ideations  Suicidal Thoughts:  No - denies any thoughts of hurting herself at this time   Homicidal Thoughts:  No- has denied any thoughts of hurting anyone else   Memory:  Recent and remote grossly intact   Judgement:  Fair  Insight:  Present  Psychomotor Activity: normal  Concentration:  Fair  Recall:  [employer] of Knowledge:Good  Language: Good  Akathisia:  Negative  Handed:  Right  AIMS (if indicated):     Assets:  Desire for  Improvement Resilience Vocational/Educational  ADL's: currently impaired   Cognition: WNL  Sleep:  Number of Hours: 4.75     Current Medications: Current Facility-Administered Medications  Medication Dose Route Frequency Provider Last Rate Last Dose  . acetaminophen (TYLENOL) tablet 650 mg  650 mg Oral Q6H PRN Patrecia Pour, NP   650 mg at 04/23/14 1424  . alum & mag hydroxide-simeth (MAALOX/MYLANTA) 200-200-20 MG/5ML suspension 30 mL  30 mL Oral Q4H PRN Patrecia Pour, NP      . divalproex (DEPAKOTE ER) 24 hr tablet 1,000 mg  1,000 mg Oral QHS Patrecia Pour, NP   1,000 mg at 04/22/14 2302  . divalproex (DEPAKOTE ER) 24 hr tablet 500 mg  500 mg Oral QHS Jenne Campus, MD  500 mg at 04/22/14 2303  . hydrochlorothiazide (HYDRODIURIL) tablet 25 mg  25 mg Oral Daily Patrecia Pour, NP   25 mg at 04/23/14 4401  . lamoTRIgine (LAMICTAL) tablet 200 mg  200 mg Oral QHS Patrecia Pour, NP   200 mg at 04/22/14 2302  . loratadine (CLARITIN) tablet 10 mg  10 mg Oral Daily Kerrie Buffalo, NP   10 mg at 04/23/14 1206  . LORazepam (ATIVAN) tablet 0.5 mg  0.5 mg Oral BID Jenne Campus, MD   0.5 mg at 04/23/14 0824  . lurasidone (LATUDA) tablet 80 mg  80 mg Oral q1800 Kerrie Buffalo, NP   80 mg at 04/22/14 1649  . magnesium hydroxide (MILK OF MAGNESIA) suspension 30 mL  30 mL Oral Daily PRN Patrecia Pour, NP      . metoprolol succinate (TOPROL-XL) 24 hr tablet 100 mg  100 mg Oral Daily Patrecia Pour, NP   100 mg at 04/23/14 0272  . multivitamin with minerals tablet 1 tablet  1 tablet Oral Daily Patrecia Pour, NP   1 tablet at 04/23/14 [phone]  . ondansetron (ZOFRAN-ODT) disintegrating tablet 4 mg  4 mg Oral Q8H PRN Kerrie Buffalo, NP   4 mg at 04/22/14 1740  . sertraline (ZOLOFT) tablet 50 mg  50 mg Oral Daily Jenne Campus, MD   50 mg at 04/23/14 4403  . traZODone (DESYREL) tablet 100 mg  100 mg Oral QHS PRN Harriet Butte, NP   100 mg at 04/22/14 2302    Lab Results:  Results for orders  placed or performed during the hospital encounter of 04/12/14 (from the past 48 hour(s))  CBC with Differential/Platelet     Status: Abnormal   Collection Time: 04/22/14  6:30 AM  Result Value Ref Range   WBC 6.7 4.0 - 10.5 K/uL   RBC 4.41 3.87 - 5.11 MIL/uL   Hemoglobin 13.2 12.0 - 15.0 g/dL   HCT 40.0 36.0 - 46.0 %   MCV 90.7 78.0 - 100.0 fL   MCH 29.9 26.0 - 34.0 pg   MCHC 33.0 30.0 - 36.0 g/dL   RDW 12.7 11.5 - 15.5 %   Platelets 199 150 - 400 K/uL   Neutrophils Relative % 42 (L) 43 - 77 %   Neutro Abs 2.9 1.7 - 7.7 K/uL   Lymphocytes Relative 42 12 - 46 %   Lymphs Abs 2.8 0.7 - 4.0 K/uL   Monocytes Relative 12 3 - 12 %   Monocytes Absolute 0.8 0.1 - 1.0 K/uL   Eosinophils Relative 3 0 - 5 %   Eosinophils Absolute 0.2 0.0 - 0.7 K/uL   Basophils Relative 1 0 - 1 %   Basophils Absolute 0.1 0.0 - 0.1 K/uL    Comment: Performed at [facility]  Hepatic function panel     Status: Abnormal   Collection Time: 04/22/14  6:30 AM  Result Value Ref Range   Total Protein 5.6 (L) 6.0 - 8.3 g/dL   Albumin 3.5 3.5 - 5.2 g/dL   AST 15 0 - 37 U/L   ALT 13 0 - 35 U/L   Alkaline Phosphatase 48 39 - 117 U/L   Total Bilirubin 0.4 0.3 - 1.2 mg/dL   Bilirubin, Direct 0.1 0.0 - 0.5 mg/dL   Indirect Bilirubin 0.3 0.3 - 0.9 mg/dL    Comment: Performed at [facility]  Valproic acid level     Status: None   Collection  Time: 04/22/14  6:30 AM  Result Value Ref Range   Valproic Acid Lvl 85.3 50.0 - 100.0 ug/mL    Comment: Performed at [facility]    Physical Findings: AIMS: Facial and Oral Movements Muscles of Facial Expression: None, normal Lips and Perioral Area: None, normal Jaw: None, normal Tongue: None, normal,Extremity Movements Upper (arms, wrists, hands, fingers): None, normal Lower (legs, knees, ankles, toes): None, normal, Trunk Movements Neck, shoulders, hips: None, normal, Overall Severity Severity of abnormal movements (highest  score from questions above): None, normal Incapacitation due to abnormal movements: None, normal Patient's awareness of abnormal movements (rate only patient's report): No Awareness, Dental Status Current problems with teeth and/or dentures?: No Does patient usually wear dentures?: No  CIWA:    COWS:      Assessment-  Patient  Reporting decreased sleep, increased anxiety . This may be partially related to vague apprehension as she approaches  Discharge. She is not presenting with any overt or noticeable manic symptom. She is not suicidal or psychotic. She is tolerating medications well, but does state felt better, less anxious, on higher Ativan dose .  Treatment Plan Summary: Daily contact with patient to assess and evaluate symptoms and progress in treatment, Medication management, Plan continue inpatient treatment and continue medications as below   Continue Depakote ER  1500 mgrs QHS  Continue Lamictal 200 mgrs QDAY Continue Latuda 80 mgrs QDAY.  Ativan 0.5 mgrs x 1 now to address anxiety   Increase Ativan to 0.5  mgrs Q 8 hours  for anxiety .  Continue Zoloft to 50 mgrs QAM.    Medical Decision Making:  Review of Psycho-Social Stressors (1), Review or order clinical lab tests (1), Established Problem, Worsening (2), Review of Last Therapy Session (1) and Review of New Medication or Change in Dosage (2)  COBOS, FERNANDO, 04/23/2014, 4:09 PM

## 2014-04-23 NOTE — Progress Notes (Signed)
D: Pt alert and visible in dayroom organizing puzzles with peers.   A: 1:1 contact made with pt to conduct shift assessment and assess current needs. All medications administered as per orders including PRN Tylenol and Claritin for c/o of headache and allergy symptoms (sneezing, itching and watery eyes). Emotional support, availability and encouragement provided to pt. Q 15 minutes checks maintained as per order for suicide without gestures or event of self injurious behavior to note at present.   R: Pt presents with irritable affect and mood on initial approach this AM. Pt stated " I'm just irritable because I didn't sleep last night, but don't worry about it I'll talk to Dr. Parke Poisson when he comes". Pt denied pain, HI and AVH when assessed. Pt verbally contracted for safety with SI while hospitalized. Remains compliant with current medication regimen. Attended and participated in groups as scheduled. Verbalized her concerns appropriately. Safety maintained on / off unit.

## 2014-04-24 DIAGNOSIS — F3160 Bipolar disorder, current episode mixed, unspecified: Secondary | ICD-10-CM | POA: Diagnosis present

## 2014-04-24 MED ORDER — DIVALPROEX SODIUM ER 500 MG PO TB24: 1500.0000 mg | ORAL_TABLET | Freq: Every day | ORAL | Status: DC

## 2014-04-24 MED ORDER — LURASIDONE HCL 80 MG PO TABS: 80.0000 mg | ORAL_TABLET | Freq: Every day | ORAL | Status: DC

## 2014-04-24 MED ORDER — LORAZEPAM 1 MG PO TABS: 1.0000 mg | ORAL_TABLET | Freq: Two times a day (BID) | ORAL | Status: DC

## 2014-04-24 MED ORDER — SERTRALINE HCL 50 MG PO TABS: 50.0000 mg | ORAL_TABLET | Freq: Every day | ORAL | Status: DC

## 2014-04-24 MED ORDER — DIVALPROEX SODIUM ER 500 MG PO TB24
1500.0000 mg | ORAL_TABLET | Freq: Every day | ORAL | Status: DC
  Filled 2014-04-24: qty 9

## 2014-04-24 MED ORDER — LAMOTRIGINE 200 MG PO TABS: 200.0000 mg | ORAL_TABLET | Freq: Every day | ORAL | Status: DC

## 2014-04-24 MED ORDER — ADULT MULTIVITAMIN W/MINERALS CH: 1.0000 | ORAL_TABLET | Freq: Every day | ORAL | Status: DC

## 2014-04-24 NOTE — Progress Notes (Addendum)
[name] has been calm and appropriate this morning. She asked questions about the Ativan schedule -- she wants it scheduled so she may take it at bedtime. She said she doesn't know if she will d/c today because she says she had trouble sleeping last night -- night staff report adequate, though interrupted sleep. She denies SI/HI/AVH and contracts for safety. No other concerns/issues verbalized.

## 2014-04-24 NOTE — Progress Notes (Signed)
Patient ID: [name], female   DOB: [birth year], 44 y.o.   MRN: [id number] Amirrah was discharged to lobby, where her mom was to pick her up. She was given AVS, script for Ativan, 14-day med supply, and MD note. Discharge information was reviewed and pt voiced understanding of follow-up appointments.

## 2014-04-24 NOTE — Discharge Summary (Signed)
Physician Discharge Summary Note  Patient:  [name] is an 44 y.o., female MRN:  [id number] DOB:  [birth year] Patient phone:  [phone] (home)  Patient address:   [place] [address],  Total Time spent with patient: 1 hour  Date of Admission:  04/12/2014 Date of Discharge: 04/24/2014   Reason for Admission:  Worsening depression  Principal Problem: MDD Discharge Diagnoses: Patient Active Problem List   Diagnosis Date Noted  . Bipolar I disorder, most recent episode mixed [F31.60] 04/18/2014  . Bipolar affective disorder, depressed, severe [F31.4] 04/12/2014  . Suicidal ideation [R45.851] 04/12/2014  . Injury of right shoulder and upper arm [S49.91XA] 02/17/2014  . Left leg pain [M79.605] 06/03/2013  . Right hip pain [M25.551] 06/03/2013  . Migraine with status migrainosus [G43.901] 01/08/2013  . Personality disorder [F60.9]   . Chronic migraine [G43.709] 05/08/2012  . Contact dermatitis [L25.9] 11/27/2011  . Major depressive disorder, recurrent episode [F33.9] 10/27/2011  . Generalized anxiety disorder [F41.1] 10/27/2011  . 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 Borderline personality disorder [F60.3] 10/27/2011  . Right foot pain [M79.671] 09/28/2011  . 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 Ganglion cyst [M67.40] 09/29/2009  . 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 BIPOLAR DISORDER UNSPECIFIED [F31.9] 06/09/2008    Psychiatric Specialty Exam:  To be completed by MD on SRA Physical Exam  ROS  Blood pressure 127/85, pulse 102, temperature 98.1 F (36.7 C), temperature source Oral, resp. rate 16, height 5\' 9"  (1.753 m), weight 233 lb (105.688 kg), last menstrual period 03/29/2014.Body mass index is 34.39 kg/(m^2).      Past Medical History:  Past Medical History  Diagnosis Date  . Bipolar disorder   . Eczema   . Pes planus   .  Achilles tendinitis   . Hypertension   . Migraine   . Achilles tendinitis   . Ganglion cyst 09/29/2009    left wrist (2 cyst)  . Arthritis   . Allergy   . Lipoma   . ADHD (attention deficit hyperactivity disorder)   . Hyperprolactinemia   . Bipolar affective   . Personality disorder     Past Surgical History  Procedure Laterality Date  . Tumor resection left thigh    . Ankle surgery  12/88    left   . Lipoma removal    . Ganglion cyst excision  2011  . Chest nodule  1990?    rt chest wall nodule removal  . Shoulder surgery  01/13/2011    right, partial tear  . Right bunioectomy     Family History:  Family History  Problem Relation Age of Onset  . Hypertension Mother   . Hyperlipidemia Mother   . Heart attack Father   . Heart disease Father   . Hypertension Father   . Diabetes Paternal Grandfather   . Heart disease Maternal Aunt   . Breast cancer Maternal Aunt   . Heart disease Maternal Grandmother   . Cancer Maternal Grandmother     colon   Social History:  History  Alcohol Use No     History  Drug Use No    History   Social History  . Marital Status: Single    Spouse Name: N/A  . Number of Children: 0  . 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 Smoking status: Never  Smoker   . Smokeless tobacco: Never Used  . Alcohol Use: No  . Drug Use: No  . Sexual Activity: Yes    Birth Control/ Protection: Pill   Other Topics Concern  . None   Social History Narrative    Past Psychiatric History: Hospitalizations:  Multiple  Outpatient Care:   Dr. Caprice Beaver  Substance Abuse Care:  none  Self-Mutilation:   none  Suicidal Attempts:  Multiple gestures and attempts in the past  Violent Behaviors:  none   Risk to Self: Is patient at risk for suicide?: Yes What has been your use of drugs/alcohol within the last 12 months?: None Risk to Others:  None Prior Inpatient Therapy:   Prior Outpatient Therapy:     Level of Care:  IOP  Hospital Course:  Patient was admitted for worsening symptoms of depression including irritability, depression, racing thoughts, poor energy and mood stabilization.      [name] was admitted to the unit and evaluated. The symptoms were identified as worsening depression, anhedonia, decreased sleep, poor appetite, feelings of guilt and worthlessness, helplessness and hopelessness, increased fatigue, crying, anhedonia, guilt, isolation, difficulty concentrating, irritability, mood swings, increased anxiety,       The patient was oriented to the unit and encouraged to participate in unit programming. Medical problems were identified and treated appropriately. Home medication was restarted as needed. Psychiatric medication management was initiated.        The patient was evaluated each day by a clinical provider to ascertain the patient's response to treatment.  Improvement was noted by the patient's report of decreasing symptoms, improved sleep and appetite, affect, medication tolerance, behavior, and participation in unit programming.  She was asked each day to complete a self inventory noting mood, mental status, pain, new symptoms, anxiety and concerns.         She responded well to medication and being in a therapeutic and supportive environment. Positive and appropriate behavior was noted and the patient was motivated for recovery. [name] worked closely with the treatment team and case manager to develop a discharge plan with appropriate goals. Coping skills, problem solving as well as relaxation therapies were also part of the unit programming.         By the day of discharge the patient was in much improved condition than upon admission.  Symptoms were reported as significantly decreased or resolved completely.         The patient denied SI/HI and voiced no AVH. He/she was motivated to continue taking medication with a goal of continued improvement in mental health.  She  was discharged home with a plan to follow up as noted below.  Consults:  None  Significant Diagnostic Studies:  labs: cbc, CMP, UDS,   Discharge Vitals:   Blood pressure 127/85, pulse 102, temperature 98.1 F (36.7 C), temperature source Oral, resp. rate 16, height 5\' 9"  (1.753 m), weight 233 lb (105.688 kg), last menstrual period 03/29/2014. Body mass index is 34.39 kg/(m^2). Lab Results:   Results for orders placed or performed during the hospital encounter of 04/12/14 (from the past 72 hour(s))  CBC with Differential/Platelet     Status: Abnormal   Collection Time: 04/22/14  6:30 AM  Result Value Ref Range   WBC 6.7 4.0 - 10.5 K/uL   RBC 4.41 3.87 - 5.11 MIL/uL   Hemoglobin 13.2 12.0 - 15.0 g/dL   HCT 40.0 36.0 - 46.0 %   MCV 90.7 78.0 - 100.0 fL   MCH  29.9 26.0 - 34.0 pg   MCHC 33.0 30.0 - 36.0 g/dL   RDW 12.7 11.5 - 15.5 %   Platelets 199 150 - 400 K/uL   Neutrophils Relative % 42 (L) 43 - 77 %   Neutro Abs 2.9 1.7 - 7.7 K/uL   Lymphocytes Relative 42 12 - 46 %   Lymphs Abs 2.8 0.7 - 4.0 K/uL   Monocytes Relative 12 3 - 12 %   Monocytes Absolute 0.8 0.1 - 1.0 K/uL   Eosinophils Relative 3 0 - 5 %   Eosinophils Absolute 0.2 0.0 - 0.7 K/uL   Basophils Relative 1 0 - 1 %   Basophils Absolute 0.1 0.0 - 0.1 K/uL    Comment: Performed at [facility]  Hepatic function panel     Status: Abnormal   Collection Time: 04/22/14  6:30 AM  Result Value Ref Range   Total Protein 5.6 (L) 6.0 - 8.3 g/dL   Albumin 3.5 3.5 - 5.2 g/dL   AST 15 0 - 37 U/L   ALT 13 0 - 35 U/L   Alkaline Phosphatase 48 39 - 117 U/L   Total Bilirubin 0.4 0.3 - 1.2 mg/dL   Bilirubin, Direct 0.1 0.0 - 0.5 mg/dL   Indirect Bilirubin 0.3 0.3 - 0.9 mg/dL    Comment: Performed at [facility]  Valproic acid level     Status: None   Collection Time: 04/22/14  6:30 AM  Result Value Ref Range   Valproic Acid Lvl 85.3 50.0 - 100.0 ug/mL    Comment: Performed at [facility]    Physical Findings: AIMS: Facial and Oral Movements Muscles of Facial Expression: None, normal Lips and Perioral Area: None, normal Jaw: None, normal Tongue: None, normal,Extremity Movements Upper (arms, wrists, hands, fingers): None, normal Lower (legs, knees, ankles, toes): None, normal, Trunk Movements Neck, shoulders, hips: None, normal, Overall Severity Severity of abnormal movements (highest score from questions above): None, normal Incapacitation due to abnormal movements: None, normal Patient's awareness of abnormal movements (rate only patient's report): No Awareness, Dental Status Current problems with teeth and/or dentures?: No Does patient usually wear dentures?: No  CIWA:    COWS:      See Psychiatric Specialty Exam and Suicide Risk Assessment completed by Attending Physician prior to discharge.  Discharge destination:  Home  Is patient on multiple antipsychotic therapies at discharge:  No   Has Patient had three or more failed trials of antipsychotic monotherapy by history:  No    Recommended Plan for Multiple Antipsychotic Therapies: NA  Discharge Instructions    Diet - low sodium heart healthy    Complete by:  As directed      Discharge instructions    Complete by:  As directed   Take all of your medications as directed. Be sure to keep all of your follow up appointments.  If you are unable to keep your follow up appointment, call your Doctor's office to let them know, and reschedule.  Make sure that you have enough medication to last until your appointment. Be sure to get plenty of rest. Going to bed at the same time each night will help. Try to avoid sleeping during the day.  Increase your activity as tolerated. Regular exercise will help you to sleep better and improve your mental health. Eating a heart healthy diet is recommended. Try to avoid salty or fried foods. Be sure to avoid all alcohol and illegal drugs.  Increase activity slowly     Complete by:  As directed             Medication List    STOP taking these medications        AMBIEN 10 MG tablet  Generic drug:  zolpidem     GRALISE 600 MG Tabs  Generic drug:  Gabapentin (Once-Daily)     hydrOXYzine 25 MG capsule  Commonly known as:  VISTARIL     ibuprofen 200 MG tablet  Commonly known as:  ADVIL,MOTRIN     promethazine 25 MG tablet  Commonly known as:  PHENERGAN      TAKE these medications      Indication   clobetasol cream 0.05 %  Commonly known as:  TEMOVATE  Apply 1 application topically 2 (two) times daily as needed (skin redness).      divalproex 500 MG 24 hr tablet  Commonly known as:  DEPAKOTE ER  Take 3 tablets (1,500 mg total) by mouth at bedtime. For mood stabilization.   Indication:  mood stabilization     hydrochlorothiazide 25 MG tablet  Commonly known as:  HYDRODIURIL  Take 1 tablet (25 mg total) by mouth daily. For blood pressure control.      lamoTRIgine 200 MG tablet  Commonly known as:  LAMICTAL  Take 200 mg by mouth at bedtime.    Mood stabilization   LORazepam 1 MG tablet  Commonly known as:  ATIVAN  Take 1 tablet (1 mg total) by mouth 2 (two) times daily.    for aniety       lurasidone 80 MG Tabs tablet  Commonly known as:  LATUDA  Take 1 tablet (80 mg total) by mouth daily at 6 PM.   Indication:  Depressive Phase of Manic-Depression, Mood Stabilization     metoprolol succinate 100 MG 24 hr tablet  Commonly known as:  TOPROL-XL  Take 1 tablet (100 mg total) by mouth daily. Take with or immediately following a meal for blood pressure control.      multivitamin with minerals Tabs tablet  Take 1 tablet by mouth daily. For nutritional supplementation.      naftifine 1 % cream  Commonly known as:  NAFTIN  Apply 1 application topically daily.      OVER THE COUNTER MEDICATION  Place 1 drop into both eyes daily as needed (dry eyes). Optix eye drops      sertraline 50 MG tablet  Commonly known as:  ZOLOFT  Take 1  tablet (50 mg total) by mouth daily.   Indication:  Anxiety Disorder, Major Depressive Disorder     SUMAtriptan 6 MG/0.5ML Soln injection  Commonly known as:  IMITREX  Inject 0.5 mLs (6 mg total) into the skin daily as needed for migraine.   Indication:  Cluster Headache, Migraine Headache     VITAMIN B-12 PO  Take 1 tablet by mouth daily.   Nutritional supplement   VITAMIN C PO  Take 1 tablet by mouth daily.   Nutritional supplement   ZINC PO  Take 1 tablet by mouth daily.   Nutritional supplement         Follow-up Information    Follow up with Dr. Caprice Beaver On 06/02/2014.   Why:  You are scheduled with Dr. Caprice Beaver on Tuesday, Jun 02, 2014 at [place].  Dr. Arvil Persons office asked that you to call to see if there has been a cancellation for you to come in for an earlier appointment   Contact information:  [place], [place]   [address]  [phone]      Follow up with [place] On 05/04/2014.   Why:  You are scheduled with Floria Raveling on Monday, May 04, 2014 at [place] information:   [address] [place], [place]   [address]  [phone]      Follow up with [facility]  On 04/30/2014.   Why:  Assessment with Dellia Nims for IOP program on Thursday March 31st at 8:45 am. Please bring completed new patient paperwork and insurance card. Please call office if you need to reschedule your appointment.   Contact information:   [address] [place] [address] [phone]      Follow-up recommendations:   Activities: Resume activity as tolerated. Diet: Heart healthy low sodium diet Tests: Follow up testing will be determined by your out patient provider.   Comments:   Patient will be attending IOP  Total Discharge Time:   Signed:   Marlane Hatcher. [place] RPAC 11:13 AM 04/24/2014  04/24/2014, 10:54 AM    Patient seen, Suicide Assessment Completed.  Disposition Plan Reviewed

## 2014-04-24 NOTE — BHH Group Notes (Signed)
   [facility] LCSW Aftercare Discharge Planning Group Note  04/24/2014  8:45 AM   Participation Quality: Alert, Appropriate and Oriented  Mood/Affect: Depressed and Flat; irritable, anxious  Depression Rating: 6  Anxiety Rating: 7  Thoughts of Suicide: Pt denies SI/HI  Will you contract for safety? Yes  Current AVH: Pt denies  Plan for Discharge/Comments: Pt attended discharge planning group and actively participated in group. CSW provided pt with today's workbook. Patient reports feeling agitated and did not sleep well last night. Patient plans to return home with her mother to follow up with outpatient services.  Transportation Means: Pt reports access to transportation  Supports: No supports mentioned at this time  Tilden Fossa, MSW, [place] Social Worker [employer] [phone]

## 2014-04-24 NOTE — Progress Notes (Signed)
  [facility] Adult Case Management Discharge Plan :  Will you be returning to the same living situation after discharge:  Yes,  patient plans to stay with her mother at discharge At discharge, do you have transportation home?: Yes,  patient reports access to transportation home Do you have the ability to pay for your medications: Yes,  patient will be provided with prescriptions at discharge  Release of information consent forms completed and in the chart;  Patient's signature needed at discharge.  Patient to Follow up at: Follow-up Information    Follow up with Dr. Caprice Beaver On 06/02/2014.   Why:  You are scheduled with Dr. Caprice Beaver on Tuesday, Jun 02, 2014 at [place].  Dr. Arvil Persons office asked that you to call to see if there has been a cancellation for you to come in for an earlier appointment   Contact information:   [address] [place], [place]   [address]  [phone]      Follow up with [facility] On 05/04/2014.   Why:  You are scheduled with Floria Raveling on Monday, May 04, 2014 at [place] information:   [address] [place], [place]   [address]  [phone]      Follow up with [facility]  On 04/30/2014.   Why:  Assessment with Dellia Nims for IOP program on Thursday March 31st at 8:45 am. Please bring completed new patient paperwork and insurance card. Please call office if you need to reschedule your appointment.   Contact information:   [address] [place] [address] [phone]      Patient denies SI/HI: Yes,  denies    Safety Planning and Suicide Prevention discussed: Yes,  with patient and mother  Have you used any form of tobacco in the last 30 days? (Cigarettes, Smokeless Tobacco, Cigars, and/or Pipes): No  Has patient been referred to the Quitline?: N/A patient is not a smoker  Ashanti Ratti, Erasmo Downer L 04/24/2014, 10:47 AM

## 2014-04-24 NOTE — BHH Suicide Risk Assessment (Signed)
[facility] Discharge Suicide Risk Assessment   Demographic Factors:  44 year old female on disability, lives with mother  Total Time spent with patient: 30 minutes  Musculoskeletal: Strength & Muscle Tone: within normal limits Gait & Station: normal Patient leans: N/A  Psychiatric Specialty Exam: Physical Exam  ROS  Blood pressure 127/85, pulse 102, temperature 98.1 F (36.7 C), temperature source Oral, resp. rate 16, height 5\' 9"  (1.753 m), weight 233 lb (105.688 kg), last menstrual period 03/29/2014.Body mass index is 34.39 kg/(m^2).  General Appearance: improved grooming  Eye Contact::  Good  Speech:  Normal Rate409  Volume:  Normal  Mood:  improved  Affect:  fuller in range, smiling often and appropriately  Thought Process:  Goal Directed and Linear  Orientation:  Full (Time, Place, and Person)  Thought Content:  no hallucinations , no delusions  Suicidal Thoughts:  No  Homicidal Thoughts:  No  Memory:  recent and remote grossly intact   Judgement:  Other:  improved  Insight:  improved   Psychomotor Activity:  Normal  Concentration:  Good  Recall:  Good  Fund of Knowledge:Good  Language: Good  Akathisia:  Negative  Handed:  Right  AIMS (if indicated):     Assets:  Communication Skills Desire for Improvement Housing Resilience  Sleep:  Number of Hours: 4.25  Cognition: WNL  ADL's:  Improved    Have you used any form of tobacco in the last 30 days? (Cigarettes, Smokeless Tobacco, Cigars, and/or Pipes): No  Has this patient used any form of tobacco in the last 30 days? (Cigarettes, Smokeless Tobacco, Cigars, and/or Pipes) No  Mental Status Per Nursing Assessment::   On Admission:  Suicidal ideation indicated by patient, Suicide plan, Self-harm thoughts, Belief that plan would result in death  Current Mental Status by Physician: At this time patient is improved compared to admission- mood improved, affect reactive, less anxious, no thought disorder, no SI or HI, no  psychotic symptoms, future oriented, 0x3.   Loss Factors: Disability, chronic mental illness  Historical Factors: History of mood disorder, (+) prior psychiatric admissions   Risk Reduction Factors:   Sense of responsibility to family, Living with another person, especially a relative and Positive coping skills or problem solving skills  Continued Clinical Symptoms:  As above, currently much improved compared to admission . Mood and affect improved, no SI , no HI. She is sleeping fairly, and we have adjusted Ativan from 0.5 mgrs TID to 1 mgr BID prior to discharge to address chronic anxiety and help with insomnia. We have reviewed her medications' side effect profiles , she is aware of potential for rash on lamictal and addictive/ sedating potential  Of BZD.   Cognitive Features That Contribute To Risk:  No gross cognitive deficits noted upon discharge. Is alert , attentive, and oriented x 3   Suicide Risk:  Mild:  Suicidal ideation of limited frequency, intensity, duration, and specificity.  There are no identifiable plans, no associated intent, mild dysphoria and related symptoms, good self-control (both objective and subjective assessment), few other risk factors, and identifiable protective factors, including available and accessible social support.  Principal Problem:  Bipolar Disorder Mixed.  Discharge Diagnoses:  Patient Active Problem List   Diagnosis Date Noted  . Bipolar I disorder, most recent episode mixed [F31.60] 04/18/2014  . Bipolar affective disorder, depressed, severe [F31.4] 04/12/2014  . Suicidal ideation [R45.851] 04/12/2014  . Injury of right shoulder and upper arm [S49.91XA] 02/17/2014  . Left leg pain [M79.605] 06/03/2013  .  Right hip pain [M25.551] 06/03/2013  . Migraine with status migrainosus [G43.901] 01/08/2013  . Personality disorder [F60.9]   . Chronic migraine [G43.709] 05/08/2012  . Contact dermatitis [L25.9] 11/27/2011  . Major depressive disorder,  recurrent episode [F33.9] 10/27/2011  . Generalized anxiety disorder [F41.1] 10/27/2011  . 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 Borderline personality disorder [F60.3] 10/27/2011  . Right foot pain [M79.671] 09/28/2011  . 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 Ganglion cyst [M67.40] 09/29/2009  . 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 BIPOLAR DISORDER UNSPECIFIED [F31.9] 06/09/2008    Follow-up Information    Follow up with Dr. Caprice Beaver On 06/02/2014.   Why:  You are scheduled with Dr. Caprice Beaver on Tuesday, Jun 02, 2014 at [place].  Dr. Arvil Persons office asked that you to call to see if there has been a cancellation for you to come in for an earlier appointment   Contact information:   [address] [place], [place]   [address]  [phone]      Follow up with [facility] On 05/04/2014.   Why:  You are scheduled with Floria Raveling on Monday, May 04, 2014 at [place] information:   [address] [place], [place]   [address]  [phone]      Follow up with [facility]  On 04/30/2014.   Why:  Assessment with Dellia Nims for IOP program on Thursday March 31st at 8:45 am. Please bring completed new patient paperwork and insurance card. Please call office if you need to reschedule your appointment.   Contact information:   [address] [place] [address] [phone]      Plan Of Care/Follow-up recommendations:  Activity:  As tolerated  Diet:  Regular Tests:  NA Other:  see below  Is patient on multiple antipsychotic therapies at discharge:  No   Has Patient had three or more failed trials of antipsychotic monotherapy by history:  No  Recommended Plan for Multiple Antipsychotic Therapies: NA   Patient is leaving unit in good spirits.  Going back to live with mother. Follow up as above.    COBOS, FERNANDO 04/24/2014,  10:52 AM

## 2014-04-28 NOTE — Progress Notes (Signed)
Patient Discharge Instructions:  After Visit Summary (AVS):   Faxed to:  04/28/14 Discharge Summary Note:   Faxed to:  04/28/14 Psychiatric Admission Assessment Note:   Faxed to:  04/28/14 Suicide Risk Assessment - Discharge Assessment:   Faxed to:  04/28/14 Faxed/Sent to the Next Level Care provider:  04/28/14 Next Level Care Provider Has Access to the EMR, 04/28/14  Faxed to [place] @ [phone] Faxed to [place] @ [phone] Records provided to [facility] via [facility]/Epic access.  Patsey Berthold, 04/28/2014, 3:13 PM

## 2014-04-29 ENCOUNTER — Ambulatory Visit: Payer: PPO | Admitting: Physical Therapy

## 2014-04-29 ENCOUNTER — Encounter: Payer: Self-pay | Admitting: Physical Therapy

## 2014-04-29 ENCOUNTER — Ambulatory Visit: Payer: PPO | Admitting: Psychology

## 2014-04-29 DIAGNOSIS — M542 Cervicalgia: Secondary | ICD-10-CM | POA: Diagnosis not present

## 2014-04-29 DIAGNOSIS — M25511 Pain in right shoulder: Secondary | ICD-10-CM

## 2014-04-29 DIAGNOSIS — M25521 Pain in right elbow: Secondary | ICD-10-CM | POA: Diagnosis not present

## 2014-04-29 NOTE — Therapy (Signed)
[facility] [place], [place], [address] Phone: [phone]   Fax:  [phone]  Physical Therapy Treatment  Patient Details  Name: [name] MRN: [id number] Date of Birth: [birth year] Referring Provider:  Dene Gentry, MD  Encounter Date: 04/29/2014      PT End of Session - 04/29/14 1525    Visit Number 11   Date for PT Re-Evaluation 05/02/14   PT Start Time 1761   PT Stop Time 1458   PT Time Calculation (min) 53 min   Activity Tolerance Patient tolerated treatment well      Past Medical History  Diagnosis Date  . Bipolar disorder   . Eczema   . Pes planus   . Achilles tendinitis   . Hypertension   . Migraine   . Achilles tendinitis   . Ganglion cyst 09/29/2009    left wrist (2 cyst)  . Arthritis   . Allergy   . Lipoma   . ADHD (attention deficit hyperactivity disorder)   . Hyperprolactinemia   . Bipolar affective   . Personality disorder     Past Surgical History  Procedure Laterality Date  . Tumor resection left thigh    . Ankle surgery  12/88    left   . Lipoma removal    . Ganglion cyst excision  2011  . Chest nodule  1990?    rt chest wall nodule removal  . Shoulder surgery  01/13/2011    right, partial tear  . Right bunioectomy      There were no vitals filed for this visit.  Visit Diagnosis:  Right shoulder pain      Subjective Assessment - 04/29/14 1405    Symptoms Not that bad.   Currently in Pain? Yes   Pain Score 2                        [facility] Adult PT Treatment/Exercise - 04/29/14 0001    Exercises   Exercises Shoulder   Shoulder Exercises: Standing   Other Standing Exercises 4# superman  2x10   Other Standing Exercises D2 flex PNF 4# 2x10   Shoulder Exercises: ROM/Strengthening   UBE (Upper Arm Bike) 5 minutes  2.35fd/2.5bk level 3   Modalities   Modalities Electrical Stimulation;Moist Heat   Moist Heat Therapy   Number  Minutes Moist Heat 15 Minutes   Moist Heat Location Shoulder   Electrical Stimulation   Electrical Stimulation Location right rhomboid   Electrical Stimulation Parameters IFC   Electrical Stimulation Goals Pain   Manual Therapy   Manual Therapy Myofascial release   Myofascial Release right rhomboid into scapula                     PT Long Term Goals - 04/10/14 0845    PT LONG TERM GOAL #1   Title decrease pain 50%   Status Partially Met   PT LONG TERM GOAL #2   Title report 50% less difficulty at work   Status Partially Met               Problem List Patient Active Problem List   Diagnosis Date Noted  . Mixed bipolar I disorder   . Bipolar I disorder, most recent episode mixed 04/18/2014  . Bipolar affective disorder, depressed, severe 04/12/2014  . Suicidal ideation 04/12/2014  . Injury of right shoulder and upper arm 02/17/2014  . Left leg  pain 06/03/2013  . Right hip pain 06/03/2013  . Migraine with status migrainosus 01/08/2013  . Personality disorder   . Chronic migraine 05/08/2012  . Contact dermatitis 11/27/2011  . Major depressive disorder, recurrent episode 10/27/2011  . Generalized anxiety disorder 10/27/2011  . 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 Borderline personality disorder 10/27/2011  . Right foot pain 09/28/2011  . 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 Ganglion cyst 09/29/2009  . PES PLANUS 07/01/2008  . BIPOLAR DISORDER UNSPECIFIED 06/09/2008    Nada Boozer PTA 04/29/2014, 3:30 PM  [address] [place], [place], [address] Phone: [phone]   Fax:  [phone]

## 2014-04-30 ENCOUNTER — Encounter (HOSPITAL_COMMUNITY): Payer: Self-pay

## 2014-04-30 ENCOUNTER — Other Ambulatory Visit (HOSPITAL_COMMUNITY): Payer: PPO | Attending: Psychiatry | Admitting: Psychiatry

## 2014-04-30 DIAGNOSIS — F909 Attention-deficit hyperactivity disorder, unspecified type: Secondary | ICD-10-CM | POA: Diagnosis not present

## 2014-04-30 DIAGNOSIS — I10 Essential (primary) hypertension: Secondary | ICD-10-CM | POA: Insufficient documentation

## 2014-04-30 DIAGNOSIS — F3162 Bipolar disorder, current episode mixed, moderate: Secondary | ICD-10-CM | POA: Insufficient documentation

## 2014-04-30 NOTE — Progress Notes (Signed)
Daily Group Progress Note  Program: IOP  Group Time: 9:00-10:30  Participation Level: Active  Behavioral Response: Appropriate  Type of Therapy:  Group Therapy  Summary of Progress: Pt. Met with psychiatrist and case manager.      Group Time: 10:30-12:00  Participation Level:  Active  Behavioral Response: Appropriate  Type of Therapy: Psycho-education Group  Summary of Progress: Pt. Participated in discussion about self-identity development, development of new interests, and defining new-healthier boundaries in relationships.  Nancie Neas, LPC
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
## 2014-05-01 ENCOUNTER — Encounter (HOSPITAL_COMMUNITY): Payer: Self-pay | Admitting: Psychiatry

## 2014-05-01 ENCOUNTER — Other Ambulatory Visit (HOSPITAL_COMMUNITY): Payer: PPO | Attending: Psychiatry | Admitting: Psychiatry

## 2014-05-01 DIAGNOSIS — F909 Attention-deficit hyperactivity disorder, unspecified type: Secondary | ICD-10-CM | POA: Diagnosis not present

## 2014-05-01 DIAGNOSIS — F419 Anxiety disorder, unspecified: Secondary | ICD-10-CM | POA: Diagnosis not present

## 2014-05-01 DIAGNOSIS — F3162 Bipolar disorder, current episode mixed, moderate: Secondary | ICD-10-CM | POA: Diagnosis present

## 2014-05-01 DIAGNOSIS — I10 Essential (primary) hypertension: Secondary | ICD-10-CM | POA: Insufficient documentation
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
## 2014-05-01 NOTE — Progress Notes (Signed)
    Daily Group Progress Note   Program: IOP  Group Time: 2297-9892  Participation Level: Active  Behavioral Response: Appropriate and Sharing  Type of Therapy:  Group Therapy  Summary of Progress:  Pt was quite manic.  Hypo-verbal at times but able to re-direct as needed.  Pt voiced how her Bipolar Disorder, Borderline Personality, and ADHD has affected her life throughout the years.  Voiced that she had hoped she would be able to work a full-time job after she completed college; but has realized that she can't work full-time at this time.  Pt mentioned how she use to be before she started to worsen.  "I miss that person."     Group Time: 1045-1200  Participation Level:  Active  Behavioral Response: Appropriate and Sharing  Type of Therapy: Psycho-education Group  Summary of Progress: Pt participated in grief/loss group, which was facilitated by Jeanella Craze.  Dellia Nims, M.Ed

## 2014-05-04 ENCOUNTER — Ambulatory Visit: Payer: Self-pay | Admitting: Psychology

## 2014-05-04 ENCOUNTER — Ambulatory Visit (INDEPENDENT_AMBULATORY_CARE_PROVIDER_SITE_OTHER): Payer: PPO | Admitting: Neurology

## 2014-05-04 ENCOUNTER — Encounter: Payer: Self-pay | Admitting: Neurology

## 2014-05-04 ENCOUNTER — Other Ambulatory Visit (HOSPITAL_COMMUNITY): Payer: PPO | Admitting: Psychiatry

## 2014-05-04 VITALS — BP 104/68 | HR 65 | Resp 20 | Ht 69.75 in | Wt 243.0 lb

## 2014-05-04 DIAGNOSIS — H53143 Visual discomfort, bilateral: Secondary | ICD-10-CM

## 2014-05-04 DIAGNOSIS — F3162 Bipolar disorder, current episode mixed, moderate: Secondary | ICD-10-CM

## 2014-05-04 DIAGNOSIS — R112 Nausea with vomiting, unspecified: Secondary | ICD-10-CM | POA: Diagnosis not present

## 2014-05-04 DIAGNOSIS — F411 Generalized anxiety disorder: Secondary | ICD-10-CM | POA: Insufficient documentation

## 2014-05-04 DIAGNOSIS — F40298 Other specified phobia: Secondary | ICD-10-CM | POA: Diagnosis not present

## 2014-05-04 DIAGNOSIS — F603 Borderline personality disorder: Secondary | ICD-10-CM

## 2014-05-04 DIAGNOSIS — G43511 Persistent migraine aura without cerebral infarction, intractable, with status migrainosus: Secondary | ICD-10-CM

## 2014-05-04 DIAGNOSIS — H5319 Other subjective visual disturbances: Secondary | ICD-10-CM

## 2014-05-04 HISTORY — DX: Visual discomfort, bilateral: H53.143

## 2014-05-04 MED ORDER — LORAZEPAM 1 MG PO TABS: 0.5000 mg | ORAL_TABLET | Freq: Two times a day (BID) | ORAL | Status: DC

## 2014-05-04 MED ORDER — VALPROATE SODIUM 500 MG/5ML IV SOLN: INTRAVENOUS | Status: DC

## 2014-05-04 MED ORDER — SUMATRIPTAN SUCCINATE 6 MG/0.5ML ~~LOC~~ SOLN: 6.0000 mg | SUBCUTANEOUS | Status: DC | PRN

## 2014-05-04 NOTE — Progress Notes (Signed)
[name]: [name] DOB: 01/22/1971  REASON FOR VISIT: follow up HISTORY FROM: [name]  HISTORY OF PRESENT ILLNESS:  [name] is a 44 year old female with a history of migraines. She was initially referred here by [facility] student health- Dr. Toney Rakes. She is currently on Depakote for migraine prophylaxis and Imitrex when needed. She also uses Phenergan for nausea. She was getting Botox injections but reported that after Joyce first round it stopped working. She is on Lamictal and latuda for mood stabilization. She takes Adderall for attention deficit. She reports that she has 12-15 headaches per month. Has photophobia and phonophobia with migraines. Does have nausea but no vomiting with migraines. Joyce weather, chocolate and bright lights can bring on a migraine. [name] has a history of gait imbalance and dizziness. However she feels that her gait has gotten worse. She has had multiple falls and tends to run into things. She has not sustained any injuries from these falls. She is currently taking Depakote and Imitrex for migraines. She is also on Lamictal and latuda for mood stabilization. She has tried Botox injections in Joyce past but they were unsuccessful. At Joyce last visit she was sent for an MRI of Joyce brain that was unremarkable. She also had blood work that was unremarkable. [name] states that she may have 1-2 migraines a week but lately they have not been so severe that she has had to go lay in a dark room.  She uses Joyce Imitrex as soon as Joyce headache comes on and that works well for her.[name] went to neuro rehab for vestibular rehab and she reports that it worked well at first. She states that Joyce dizziness has occurred since rehab not so much with positional changes. She states that in Joyce last 2 weeks she has had about 4 episodes of dizziness. They normally last only a minute or two and sometimes as short as 30 seconds. She describes Joyce dizziness as " dropping on an elevator."  She feels slightly disoriented during Joyce episodes. Joyce dizziness does not always occur with her migraines. She went to Joyce ED twice for chest pain- all test were negative. She has not seen a cardiologist.  She states that she has been on Ambien for years but has lately she has had two instances of sleep walking. She states that there was several things that was broken in her room but she does not recall this. She saw Dr. Caprice Beaver and she has cut Joyce dose in half.   Interval history 05-04-14. [name] is here today stating that she feels currently very overwhelmed I'm not sure if Joyce depression or her recent admission to behavior health precipitated her headaches but she presents with a 3 day duration of major migraine was photophobia, qualifying as status migrainosus. She did not discontinue any of her medications are used to prevent headaches and she continues to have access but  Imitrex but it has not worked during her hospitalization in March 13 th through 25 th. She forgot she had a refill on imitrex at all.  We'll be happy to refill her Imitrex she has significant amount of nausea but so far has not vomited. I would consider her a [facility] candidate -Joyce Imitrex patch.  However we would have to document that other triptan's have been not working for her.   Plan Imitrex ;She has subcutaneus injections, need those  refilled. Phenergan . New medications and overlap reaction checked.  REVIEW OF SYSTEMS: Out of a complete 14 system review of symptoms, [name] complains only of Joyce following symptoms, and all other reviewed systems are negative.  Activity change Light sensitivity Insomnia Sleep walking Aching muscles Neck pain Dizziness Headache  ALLERGIES: Allergies  Allergen Reactions  . Adhesive [Tape] Itching and Rash    Also reacted to Steri Strips and Band-Aids.  . Dilaudid [Hydromorphone Hcl] Itching  . Morphine Nausea And Vomiting  . Penicillins Hives  . Percocet  [Oxycodone-Acetaminophen] Itching  . Prednisone Hives  . Provera [Medroxyprogesterone Acetate] Other (See Comments)    Causes manic episodes  . Ultram [Tramadol Hcl] Itching    HOME MEDICATIONS: Outpatient Prescriptions Prior to Visit  Medication Sig Dispense Refill  . clobetasol cream (TEMOVATE) 4.74 % Apply 1 application topically 2 (two) times daily as needed (skin redness).     . Cyanocobalamin (VITAMIN B-12 PO) Take 1 tablet by mouth daily.    . divalproex (DEPAKOTE ER) 500 MG 24 hr tablet Take 3 tablets (1,500 mg total) by mouth at bedtime. For mood stabilization. 90 tablet 0  . hydrochlorothiazide (HYDRODIURIL) 25 MG tablet Take 1 tablet (25 mg total) by mouth daily. For blood pressure control. 30 tablet 0  . lamoTRIgine (LAMICTAL) 200 MG tablet Take 1 tablet (200 mg total) by mouth at bedtime. 30 tablet 0  . lurasidone (LATUDA) 80 MG TABS tablet Take 1 tablet (80 mg total) by mouth daily at 6 PM. 30 tablet 0  . metoprolol succinate (TOPROL-XL) 100 MG 24 hr tablet Take 1 tablet (100 mg total) by mouth daily. Take with or immediately following a meal for blood pressure control. 30 tablet 0  . Multiple Vitamin (MULTIVITAMIN WITH MINERALS) TABS tablet Take 1 tablet by mouth daily. For nutritional supplementation. 30 tablet 0  . sertraline (ZOLOFT) 50 MG tablet Take 1 tablet (50 mg total) by mouth daily. 30 tablet 0  . LORazepam (ATIVAN) 1 MG tablet Take 1 tablet (1 mg total) by mouth 2 (two) times daily. ([name] taking differently: Take 0.5 mg by mouth 2 (two) times daily. ) 30 tablet 0  . lurasidone (LATUDA) 80 MG TABS tablet Take 1 tablet (80 mg total) by mouth daily at 6 PM. For psychosis and mood stabilization. 30 tablet 0   No facility-administered medications prior to visit.    PAST MEDICAL HISTORY: Past Medical History  Diagnosis Date  . Bipolar disorder   . Eczema   . Pes planus   . Achilles tendinitis   . Hypertension   . Migraine   . Achilles tendinitis   . Ganglion  cyst 09/29/2009    left wrist (2 cyst)  . Arthritis   . Allergy   . Lipoma   . ADHD (attention deficit hyperactivity disorder)   . Hyperprolactinemia   . Bipolar affective   . Personality disorder     PAST SURGICAL HISTORY: Past Surgical History  Procedure Laterality Date  . Tumor resection left thigh    . Ankle surgery  12/88    left   . Lipoma removal    . Ganglion cyst excision  2011  . Chest nodule  1990?    rt chest wall nodule removal  . Shoulder surgery  01/13/2011    right, partial tear  . Right bunioectomy      FAMILY HISTORY: Family History  Problem Relation Age of Onset  . Hypertension Mother   . Hyperlipidemia Mother   . Heart attack Father   . Heart disease Father   .  Hypertension Father   . Bipolar disorder Father   . Diabetes Paternal Grandfather   . Heart disease Maternal Aunt   . Breast cancer Maternal Aunt   . Heart disease Maternal Grandmother   . Cancer Maternal Grandmother     colon    SOCIAL HISTORY: History   Social History  . Marital Status: Single    Spouse Name: N/A  . Number of Children: 0  . Years of Education: N/A   Occupational History  .  [employer]   Social History Main Topics  . Smoking status: Never Smoker   . Smokeless tobacco: Never Used  . Alcohol Use: No  . Drug Use: No  . Sexual Activity: No   Other Topics Concern  . Not on file   Social History Narrative   Caffeine  2 sodas daily, 1 cup coffee daily.      PHYSICAL EXAM  Filed Vitals:   05/04/14 1438  BP: 104/68  Pulse: 65  Resp: 20  Height: 5' 9.75" (1.772 m)  Weight: 243 lb (110.224 kg)   Body mass index is 35.1 kg/(m^2).  Generalized: Well developed, in no acute distress , overweight. Neck 15 inches,  mallompati 3 .  No goiter.   Neurological examination  Mentation: Alert oriented to time, place, history taking. Follows all commands speech and language fluent. Depressed, tear full, desperate . mood changes rapidly to pleading,  Cranial nerve  Pupils were equal round reactive to light. Extraocular movements were full, visual field were full on confrontational test. No nystagmus and no endpoint nystagmus is noted. Facial sensation and strength were normal. Uvula tongue midline. Head turning and shoulder shrug  were normal and symmetric. Motor: Joyce motor testing reveals 5 over 5 strength of all 4 extremities. Good symmetric motor tone is noted throughout. Hand grip is a little weaker than expected for age and muscle tone. Sensory: Sensory testing is intact to soft touch on all 4 extremities. No evidence of extinction is noted.  Coordination: Cerebellar testing reveals good finger-nose-finger and heel-to-shin bilaterally.  [name] has a mild action tremor to some degree noted at rest which I attributed to Joyce Depakote medication. This has been seen before. Gait and station: Gait is normal. Tandem gait is normal. Romberg is negative. No drift is seen.   Reflexes: Deep tendon reflexes are symmetric and normal bilaterally. Negative Babinski response.  No hyperreflexia or clonus is noted no rigor of Joyce muscles nor loss of muscle tone. Muscle bulk is symmetric.    DIAGNOSTIC DATA (LABS, IMAGING, TESTING) - I reviewed [name] records, labs, notes, testing and imaging myself where available.  Lab Results  Component Value Date   WBC 6.7 04/22/2014   HGB 13.2 04/22/2014   HCT 40.0 04/22/2014   MCV 90.7 04/22/2014   PLT 199 04/22/2014      Component Value Date/Time   NA 139 04/12/2014 1714   K 3.7 04/12/2014 1714   CL 104 04/12/2014 1714   CO2 27 04/12/2014 1714   GLUCOSE 83 04/12/2014 1714   BUN 10 04/12/2014 1714   CREATININE 0.89 04/12/2014 1714   CREATININE 0.83 09/11/2011 1524   CALCIUM 9.5 04/12/2014 1714   PROT 5.6* 04/22/2014 0630   ALBUMIN 3.5 04/22/2014 0630   AST 15 04/22/2014 0630   ALT 13 04/22/2014 0630   ALKPHOS 48 04/22/2014 0630   BILITOT 0.4 04/22/2014 0630   GFRNONAA 78* 04/12/2014 1714   GFRAA >90  04/12/2014 1714   Lab Results  Component Value Date  CHOL 165 04/14/2014   HDL 55 04/14/2014   LDLCALC 90 04/14/2014   TRIG 99 04/14/2014   CHOLHDL 3.0 04/14/2014   Lab Results  Component Value Date   HGBA1C 5.3 04/14/2014   No results found for: VITAMINB12 Lab Results  Component Value Date   TSH 1.277 04/14/2014      ASSESSMENT AND PLAN  44 y.o. year old female     1) recent manic depressive decompensation, rapid cycle ,  With suicidal ideation,  admitted to behavior health.      [name] carries Joyce diagnosis of borderline personality disorder.  2) Migraine, now in status migrainosus.  3 days of severe photophobia.  2. Dizziness, persistent.   [name]'s migraines have improved with Depakote and Imitrex. She had in Joyce past  approximately 1-2 migraines a week which were relieved with Imitrex. Now i status migrainosus.   I will not order blood work ,  As I reviewed Joyce hospital records and lab tests with her , discussion of how to treat status migrainosus nosus. IV  With depakene through infusion nurse.    [name] is less complaining of dizziness. She describes it as feeling like she is "dropping on elevator." She had an MRI of Joyce brain in Joyce past that was unremarkable. Her  carotid Dopplers were normal . I have advised [name] to let her primary care treat Joyce episodes of dizziness as well as  chest pain.  [name] verbalized understanding. She will follow up in 6 months with Ward Givens, NP   Larey Seat , MD   05/04/2014, 3:26 PM [facility] [address], [place], [place] [address] [phone]

## 2014-05-04 NOTE — Patient Instructions (Signed)
Abdominal Migraine Abdominal migraine is one of several types of migraine. It is one example of "periodic syndrome". The periodic type more commonly occurs in children. Such children usually have a family history of migraine. Children may go on to develop typical migraines later in their lives.  SYMPTOMS  The attacks usually include intermittent periods of abdominal pain. Along with the abdominal pain, other symptoms may occur such as:  Nausea.  Vomiting.  Intense blushing or reddening of the skin (flushing).  Pale appearance to the skin (pallor). DIAGNOSIS  Tests may be done to look for other possible conditions. TREATMENT  Medications that are useful in treating migraine may also work to control these attacks in most children. Document Released: 04/08/2003 Document Revised: 05/13/2012 Document Reviewed: 09/04/2007 [facility] Patient Information 2015 [employer], [place]. This information is not intended to replace advice given to you by your health care provider. Make sure you discuss any questions you have with your health care provider.

## 2014-05-04 NOTE — Progress Notes (Signed)
    Daily Group Progress Note  Program: IOP  Group Time: 9:00-10:30  Participation Level: Active  Behavioral Response: Appropriate  Type of Therapy:  Psycho-education Group  Summary of Progress: Pt. Participated in medication education group with [place].     Group Time: 10:30-12:00  Participation Level:  Minimal  Behavioral Response: Resistant, Agitated  Type of Therapy: Group Therapy  Summary of Progress: Pt. Avoided eye contact with group members, disconnected from group in passive aggressive manner. Pt. Was asked about experience with heartmath and responded "I'm not going to talk about that. Don't ask me about that". Pt. Openly discussed prior group experiences last week. Pt. Requested time with me alone after group and disclosed that she was angered by another group member's presence, angry with group member because of nature of the other member's problems. Pt. Was encouraged to focus on her personal issues and not engage in comparison to others. Pt. Appeared overwhelmed by stressors from home which includes her mother and stress at work.   Nancie Neas, LPC

## 2014-05-05 ENCOUNTER — Encounter: Payer: Self-pay | Admitting: Physical Therapy

## 2014-05-05 ENCOUNTER — Other Ambulatory Visit (HOSPITAL_COMMUNITY): Payer: PPO | Admitting: Psychiatry

## 2014-05-05 ENCOUNTER — Ambulatory Visit: Payer: PPO | Attending: Family Medicine | Admitting: Physical Therapy

## 2014-05-05 DIAGNOSIS — M25511 Pain in right shoulder: Secondary | ICD-10-CM | POA: Insufficient documentation

## 2014-05-05 DIAGNOSIS — F3162 Bipolar disorder, current episode mixed, moderate: Secondary | ICD-10-CM | POA: Diagnosis not present

## 2014-05-05 DIAGNOSIS — M25521 Pain in right elbow: Secondary | ICD-10-CM | POA: Diagnosis not present

## 2014-05-05 DIAGNOSIS — M542 Cervicalgia: Secondary | ICD-10-CM | POA: Insufficient documentation

## 2014-05-05 NOTE — Therapy (Signed)
[place], [place], [address] Phone: [phone]   Fax:  [phone]  Physical Therapy Treatment  Patient Details  Name: [name] MRN: [id number] Date of Birth: 1970/03/13 Referring Provider:  Dene Gentry, MD  Encounter Date: 05/05/2014    Past Medical History  Diagnosis Date  . Bipolar disorder   . Eczema   . Pes planus   . Achilles tendinitis   . Hypertension   . Migraine   . Achilles tendinitis   . Ganglion cyst 09/29/2009    left wrist (2 cyst)  . Arthritis   . Allergy   . Lipoma   . ADHD (attention deficit hyperactivity disorder)   . Hyperprolactinemia   . Bipolar affective   . Personality disorder     Past Surgical History  Procedure Laterality Date  . Tumor resection left thigh    . Ankle surgery  12/88    left   . Lipoma removal    . Ganglion cyst excision  2011  . Chest nodule  1990?    rt chest wall nodule removal  . Shoulder surgery  01/13/2011    right, partial tear  . Right bunioectomy      There were no vitals filed for this visit.  Visit Diagnosis:  Right shoulder pain      Subjective Assessment - 05/05/14 1411    Subjective Multi-day migraine. Shoulder is not that bad today.   Currently in Pain? Yes   Pain Score 4    Pain Location Shoulder            [facility] PT Assessment - 05/05/14 0001    ROM / Strength   AROM / PROM / Strength AROM   AROM   AROM Assessment Site Shoulder   Right/Left Shoulder Right   Right Shoulder Flexion 139 Degrees   Right Shoulder ABduction 143 Degrees                   [facility] Adult PT Treatment/Exercise - 05/05/14 0001    Exercises   Exercises Shoulder   Shoulder Exercises: Standing   Other Standing Exercises 4# superman   Other Standing Exercises D2 flex PNF 4# 2x10   Shoulder Exercises: ROM/Strengthening   UBE (Upper Arm Bike) 5 minutes   Modalities   Modalities Electrical  Stimulation;Moist Heat                     PT Long Term Goals - 04/10/14 0845    PT LONG TERM GOAL #1   Title decrease pain 50%   Status Partially Met   PT LONG TERM GOAL #2   Title report 50% less difficulty at work   Status Partially Met               Problem List Patient Active Problem List   Diagnosis Date Noted  . Phonophobia 05/04/2014  . Photophobia of both eyes 05/04/2014  . Emotionally unstable borderline personality disorder 05/04/2014  . Anxiety state 05/04/2014  . Nausea with vomiting 05/04/2014  . Mixed bipolar I disorder   . Bipolar I disorder, most recent episode mixed 04/18/2014  . Bipolar affective disorder, depressed, severe 04/12/2014  . Suicidal ideation 04/12/2014  . Injury of right shoulder and upper arm 02/17/2014  . Left leg pain 06/03/2013  . Right hip pain 06/03/2013  . Migraine with status migrainosus 01/08/2013  . Personality disorder   . Chronic migraine 05/08/2012  .  Contact dermatitis 11/27/2011  . Major depressive disorder, recurrent episode 10/27/2011  . Generalized anxiety disorder 10/27/2011  . 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 Borderline personality disorder 10/27/2011  . Right foot pain 09/28/2011  . 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 Ganglion cyst 09/29/2009  . PES PLANUS 07/01/2008  . BIPOLAR DISORDER UNSPECIFIED 06/09/2008    Nada Boozer PTA 05/05/2014, 3:30 PM  [address] [place], [place], [address] Phone: [phone]   Fax:  [phone]

## 2014-05-05 NOTE — Progress Notes (Signed)
    Daily Group Progress Note  Program: IOP  Group Time:   Participation Level:   Behavioral Response:   Type of Therapy:    Summary of Progress:      Group Time: 1100-1200  Participation Level:  Active  Behavioral Response: Appropriate, Sharing and Monopolizing  Type of Therapy: Psycho-education Group  Summary of Progress: Discussed +/- nightly rituals and ways to enhance sleep by way of deep breathing techniques, meditation, progressive muscle relaxation and guided imagery.

## 2014-05-06 ENCOUNTER — Ambulatory Visit: Payer: PPO | Admitting: Psychology

## 2014-05-06 ENCOUNTER — Other Ambulatory Visit (HOSPITAL_COMMUNITY): Payer: PPO | Admitting: Psychiatry

## 2014-05-06 DIAGNOSIS — F3162 Bipolar disorder, current episode mixed, moderate: Secondary | ICD-10-CM | POA: Diagnosis not present

## 2014-05-06 NOTE — Progress Notes (Signed)
    Daily Group Progress Note  Program: IOP  Group Time: 9:00-10:30  Participation Level: Active  Behavioral Response: Appropriate  Type of Therapy:  Group Therapy  Summary of Progress: Pt. Reported that she was recovering from a migraine. Pt. Reported that yesterday that she went to work for 3 hours and physical therapy. Pt. Discussed shame related to mental health diagnoses and the burden of projecting that she is happy to her co-workers and supervisors when she is not.        Nancie Neas, LPC

## 2014-05-07 ENCOUNTER — Other Ambulatory Visit (HOSPITAL_COMMUNITY): Payer: PPO | Admitting: Psychiatry

## 2014-05-07 DIAGNOSIS — F3162 Bipolar disorder, current episode mixed, moderate: Secondary | ICD-10-CM | POA: Diagnosis not present

## 2014-05-07 NOTE — Progress Notes (Signed)
    Daily Group Progress Note  Program: IOP  Group Time: 9:00-10:30  Participation Level: Active  Behavioral Response: Resistant and Blaming  Type of Therapy:  Group Therapy  Summary of Progress: Pt. Discussed current work schedule is a significant stressor, relationships with mother and brother are stressors, financial stress, and desire to live autonomously but feels dependent on mother due to disability and finances.      Group Time: 10:30-12:00  Participation Level:  Active  Behavioral Response: Resistant and Blaming  Type of Therapy: Psycho-education Group  Summary of Progress: Pt. Participated in instruction on grounding yoga exercises. Pt. Confronted group member aggressively with poor insight about how the interaction reflected pattern of poor communication with others. Pt. Requested private meetings with two other group members, forming alliances, engaging in group splitting.   Nancie Neas, LPC

## 2014-05-07 NOTE — Progress Notes (Signed)
    Daily Group Progress Note  Program: IOP  Group Time: 9:00-10:30  Participation Level: Active  Behavioral Response: Passive-Aggressive  Type of Therapy:  Group Therapy  Summary of Progress: Pt. Presented with elevated mood, talkative. Pt. Shared that work is a significant stressor and discussed plans to reduce work schedule especially during periods of mental health crises. Pt. Apologized for aggressive behavior, hurtful behavior during yesterday's group.     Group Time: 10:30-12:00  Participation Level:  Active  Behavioral Response: Appropriate  Type of Therapy: Psycho-education Group  Summary of Progress: Pt. Watched and discussed Ruby Wax video about the stigma of mental illness, participated in guided meditation and reflective reading.  Nancie Neas, LPC

## 2014-05-08 ENCOUNTER — Other Ambulatory Visit (HOSPITAL_COMMUNITY): Payer: PPO | Admitting: Psychiatry

## 2014-05-08 ENCOUNTER — Ambulatory Visit (INDEPENDENT_AMBULATORY_CARE_PROVIDER_SITE_OTHER): Payer: PPO | Admitting: Psychology

## 2014-05-08 DIAGNOSIS — F3341 Major depressive disorder, recurrent, in partial remission: Secondary | ICD-10-CM | POA: Diagnosis not present

## 2014-05-08 DIAGNOSIS — F3162 Bipolar disorder, current episode mixed, moderate: Secondary | ICD-10-CM | POA: Diagnosis not present

## 2014-05-08 NOTE — Progress Notes (Signed)
    Daily Group Progress Note  Program: IOP  Group Time: 9:00-10:30  Participation Level: Active  Behavioral Response: Appropriate  Type of Therapy:  Group Therapy  Summary of Progress: Pt. Presented with bright affect, talkative, slightly monopolizing. Pt. Reported that she has been socially active with friends and goes out the movies frequently. Pt. Reported that her mother is severely controlling which contributes to difficulties regulating her mood.      Group Time: 10:30-12:00  Participation Level:  Active  Behavioral Response: Appropriate  Type of Therapy: Psycho-education Group  Summary of Progress: Pt. Participated in grief and loss group. Pt. Shared multiple losses including deaths of family members and loss of connection after completing her undergraduate degree.  Nancie Neas, LPC

## 2014-05-11 ENCOUNTER — Other Ambulatory Visit (HOSPITAL_COMMUNITY): Payer: PPO | Admitting: Psychiatry

## 2014-05-11 DIAGNOSIS — F3162 Bipolar disorder, current episode mixed, moderate: Secondary | ICD-10-CM | POA: Diagnosis not present

## 2014-05-11 NOTE — Progress Notes (Signed)
    Daily Group Progress Note  Program: IOP  Group Time: 9:00-10:30  Participation Level: Active  Behavioral Response: Appropriate  Type of Therapy:  Psycho-education Group  Summary of Progress: Pt. Participated in medication management group with Jiles Garter.      Group Time: 10:30-12:00  Participation Level:  Active  Behavioral Response: Appropriate  Type of Therapy: Group Therapy  Summary of Progress: Pt. Presents as talkative and often monopolizing. Pt. Continues to be aggressive and at times argumentative with another group member, but responsive to redirection. Pt. Reported that she was able to exercise more age appropriate boundaries with her mother in regards to bedtime and wakeup time.   Nancie Neas, LPC

## 2014-05-12 ENCOUNTER — Encounter: Payer: Self-pay | Admitting: Physical Therapy

## 2014-05-12 ENCOUNTER — Ambulatory Visit: Payer: PPO | Admitting: Physical Therapy

## 2014-05-12 ENCOUNTER — Other Ambulatory Visit (HOSPITAL_COMMUNITY): Payer: PPO | Admitting: Psychiatry

## 2014-05-12 DIAGNOSIS — M25511 Pain in right shoulder: Secondary | ICD-10-CM

## 2014-05-12 DIAGNOSIS — F3162 Bipolar disorder, current episode mixed, moderate: Secondary | ICD-10-CM | POA: Diagnosis not present

## 2014-05-12 NOTE — Therapy (Signed)
[place] [place] [address] [place], [place], [address] Phone: [phone]   Fax:  [phone]  Physical Therapy Treatment  Patient Details  Name: [name] MRN: [id number] Date of Birth: 13-Feb-1970 Referring Provider:  Dene Gentry, MD  Encounter Date: 05/12/2014      PT End of Session - 05/12/14 1440    Visit Number 13   Date for PT Re-Evaluation 06/01/14   PT Start Time 1400   PT Stop Time 1500   PT Time Calculation (min) 60 min      Past Medical History  Diagnosis Date  . Bipolar disorder   . Eczema   . Pes planus   . Achilles tendinitis   . Hypertension   . Migraine   . Achilles tendinitis   . Ganglion cyst 09/29/2009    left wrist (2 cyst)  . Arthritis   . Allergy   . Lipoma   . ADHD (attention deficit hyperactivity disorder)   . Hyperprolactinemia   . Bipolar affective   . Personality disorder     Past Surgical History  Procedure Laterality Date  . Tumor resection left thigh    . Ankle surgery  12/88    left   . Lipoma removal    . Ganglion cyst excision  2011  . Chest nodule  1990?    rt chest wall nodule removal  . Shoulder surgery  01/13/2011    right, partial tear  . Right bunioectomy      There were no vitals filed for this visit.  Visit Diagnosis:  Right shoulder pain      Subjective Assessment - 05/12/14 1406    Subjective I am doing better mentally.  Shoulder has "good days and bad days".   Patient Stated Goals no pain with work activity   Currently in Pain? Yes   Pain Score 3    Pain Location Shoulder   Pain Orientation Right   Pain Descriptors / Indicators Aching   Pain Onset 1 to 4 weeks ago   Pain Frequency Intermittent   Aggravating Factors  lifting and reaching   Pain Relieving Factors the treatment seems to be helping                       [facility] Adult PT Treatment/Exercise - 05/12/14 0001    Neck Exercises: Machines for Strengthening    UBE (Upper Arm Bike) Level 5 x 5 minutes   Cybex Row 25# 2x15   Cybex Chest Press 5# 2x15   Power Tower lat pulls 20# 2x15   Shoulder Exercises: Sidelying   Theraband Level (Shoulder External Rotation) Level 2 (Red)   Shoulder Exercises: ROM/Strengthening   Wall Pushups 20 reps   "W" Arms 2#   X to V Arms 2#   Moist Heat Therapy   Number Minutes Moist Heat 15 Minutes   Moist Heat Location Shoulder   Electrical Stimulation   Electrical Stimulation Location right rhomboid   Electrical Stimulation Parameters IFC   Electrical Stimulation Goals Pain   Manual Therapy   Manual Therapy Myofascial release   Myofascial Release right rhomboid into scapula                     PT Long Term Goals - 04/10/14 0845    PT LONG TERM GOAL #1   Title decrease pain 50%   Status Partially Met   PT LONG TERM GOAL #2  Title report 50% less difficulty at work   Status Partially Met               Plan - 05/12/14 1440    Clinical Impression Statement Some increased popping in the shoulder.  I think this helps a lot.   Pt will benefit from skilled therapeutic intervention in order to improve on the following deficits Pain;Increased muscle spasms;Decreased strength;Decreased range of motion   PT Frequency 2x / week   PT Duration 2 weeks   PT Treatment/Interventions Electrical Stimulation;Moist Heat;Therapeutic exercise;Patient/family education;Manual techniques   PT Next Visit Plan continue to work on scapular stabilization   Consulted and Agree with Plan of Care Patient        Problem List Patient Active Problem List   Diagnosis Date Noted  . Phonophobia 05/04/2014  . Photophobia of both eyes 05/04/2014  . Emotionally unstable borderline personality disorder 05/04/2014  . Anxiety state 05/04/2014  . Nausea with vomiting 05/04/2014  . Mixed bipolar I disorder   . Bipolar I disorder, most recent episode mixed 04/18/2014  . Bipolar affective disorder, depressed, severe  04/12/2014  . Suicidal ideation 04/12/2014  . Injury of right shoulder and upper arm 02/17/2014  . Left leg pain 06/03/2013  . Right hip pain 06/03/2013  . Migraine with status migrainosus 01/08/2013  . Personality disorder   . Chronic migraine 05/08/2012  . Contact dermatitis 11/27/2011  . Major depressive disorder, recurrent episode 10/27/2011  . Generalized anxiety disorder 10/27/2011  . 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 Borderline personality disorder 10/27/2011  . Right foot pain 09/28/2011  . 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 Ganglion cyst 09/29/2009  . PES PLANUS 07/01/2008  . BIPOLAR DISORDER UNSPECIFIED 06/09/2008    Sumner Boast, PT 05/12/2014, 2:42 PM  [address] [place], [place], [address] Phone: [phone]   Fax:  [phone]

## 2014-05-13 ENCOUNTER — Other Ambulatory Visit (HOSPITAL_COMMUNITY): Payer: PPO

## 2014-05-13 ENCOUNTER — Ambulatory Visit (INDEPENDENT_AMBULATORY_CARE_PROVIDER_SITE_OTHER): Payer: PPO | Admitting: Psychology

## 2014-05-13 DIAGNOSIS — F3341 Major depressive disorder, recurrent, in partial remission: Secondary | ICD-10-CM | POA: Diagnosis not present

## 2014-05-13 NOTE — Progress Notes (Signed)
    Daily Group Progress Note  Program: IOP  Group Time: 9:00-10:30  Participation Level: Active  Behavioral Response: Appropriate  Type of Therapy:  Group Therapy  Summary of Progress: Pt. Presented as energetic and with bright affect. Pt. Shared quotes about borderline personality disorder and bipolar disorder with the group.     Group Time: 10:30-12:00  Participation Level:  Active  Behavioral Response: Appropriate  Type of Therapy: Psycho-education Group  Summary of Progress: Pt. Participated in session facilitated by Alyse fromt he mental [facility].   Nancie Neas, LPC

## 2014-05-14 ENCOUNTER — Encounter: Payer: Self-pay | Admitting: Physical Therapy

## 2014-05-14 ENCOUNTER — Ambulatory Visit: Payer: PPO | Admitting: Physical Therapy

## 2014-05-14 ENCOUNTER — Other Ambulatory Visit (HOSPITAL_COMMUNITY): Payer: PPO | Admitting: Psychiatry

## 2014-05-14 DIAGNOSIS — M25511 Pain in right shoulder: Secondary | ICD-10-CM | POA: Diagnosis not present

## 2014-05-14 DIAGNOSIS — F3162 Bipolar disorder, current episode mixed, moderate: Secondary | ICD-10-CM

## 2014-05-14 NOTE — Progress Notes (Signed)
    Daily Group Progress Note  Program: IOP  Group Time: 9:00-10:30  Participation Level: Active  Behavioral Response: Appropriate  Type of Therapy:  Group Therapy  Summary of Progress: Pt. Was active participant in group process. Pt. Presented as talkative, made good eye contact. Pt. 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 Time was spent normalizing Pt.'s feelings of sadness and testing the incident against Pt.'s history of feeling abandoned and rejected in relationships.     Group Time: 10:30-12:00  Participation Level:  Active  Behavioral Response: Appropriate  Type of Therapy: Psycho-education Group  Summary of Progress: Pt. Participated in discussion about anxiety and treating with a combination of medication, environmental changes and use of coping skills (i.e., breathing, progressive muscle relaxation, meditation).  Nancie Neas, LPC

## 2014-05-14 NOTE — Therapy (Signed)
[facility] [address] [place], [place], [address] Phone: [phone]   Fax:  [phone]  Physical Therapy Treatment  Patient Details  Name: [name] MRN: [id number] Date of Birth: 04/02/1970 Referring Provider:  Hudnall, Shane R, MD  Encounter Date: 05/14/2014      PT End of Session - 05/14/14 1346    Visit Number 14   Date for PT Re-Evaluation 06/01/14   PT Start Time 1259   PT Stop Time 1406   PT Time Calculation (min) 67 min      Past Medical History  Diagnosis Date  . Bipolar disorder   . Eczema   . Pes planus   . Achilles tendinitis   . Hypertension   . Migraine   . Achilles tendinitis   . Ganglion cyst 09/29/2009    left wrist (2 cyst)  . Arthritis   . Allergy   . Lipoma   . ADHD (attention deficit hyperactivity disorder)   . Hyperprolactinemia   . Bipolar affective   . Personality disorder     Past Surgical History  Procedure Laterality Date  . Tumor resection left thigh    . Ankle surgery  12/88    left   . Lipoma removal    . Ganglion cyst excision  2011  . Chest nodule  1990?    rt chest wall nodule removal  . Shoulder surgery  01/13/2011    right, partial tear  . Right bunioectomy      There were no vitals filed for this visit.  Visit Diagnosis:  Right shoulder pain      Subjective Assessment - 05/14/14 1303    Subjective I am sore today, still feeling better, tight feeling in right shoulder   Limitations Lifting;House hold activities   Patient Stated Goals no pain with work activity   Currently in Pain? Yes   Pain Score 5    Pain Location Shoulder   Pain Orientation Right                       [facility] Adult PT Treatment/Exercise - 05/14/14 0001    Neck Exercises: Machines for Strengthening   UBE (Upper Arm Bike) Level 5 x 5 minutes   Cybex Row 25# 2x15   Cybex Chest Press 5# 2x15   Power Tower lat pulls 20# 2x15   Other Machines for  Strengthening Body blade all motions 20 seconds 2 sets each   Shoulder Exercises: Seated   Other Seated Exercises 4# bent over row and extension   Shoulder Exercises: ROM/Strengthening   Wall Pushups 20 reps   "W" Arms 2#   X to V Arms 2#   Moist Heat Therapy   Number Minutes Moist Heat 15 Minutes   Moist Heat Location Shoulder   Electrical Stimulation   Electrical Stimulation Location right rhomboid   Electrical Stimulation Parameters IFC   Electrical Stimulation Goals Pain   Manual Therapy   Manual Therapy Myofascial release   Myofascial Release right rhomboid into scapula                     PT Long Term Goals - 04/10/14 0845    PT LONG TERM GOAL #1   Title decrease pain 50%   Status Partially Met   PT LONG TERM GOAL #2   Title report 50% less difficulty at work   Status Partially Met                 Plan - 05/14/14 1347    Clinical Impression Statement I am doing better overal but seems to be sore today.  "I have to work 3 days in a row this weekend"  We will have to see how I do.   PT Next Visit Plan continue to work on scapular stabilization and RC strength   Consulted and Agree with Plan of Care Patient        Problem List Patient Active Problem List   Diagnosis Date Noted  . Phonophobia 05/04/2014  . Photophobia of both eyes 05/04/2014  . Emotionally unstable borderline personality disorder 05/04/2014  . Anxiety state 05/04/2014  . Nausea with vomiting 05/04/2014  . Mixed bipolar I disorder   . Bipolar I disorder, most recent episode mixed 04/18/2014  . Bipolar affective disorder, depressed, severe 04/12/2014  . Suicidal ideation 04/12/2014  . Injury of right shoulder and upper arm 02/17/2014  . Left leg pain 06/03/2013  . Right hip pain 06/03/2013  . Migraine with status migrainosus 01/08/2013  . Personality disorder   . Chronic migraine 05/08/2012  . Contact dermatitis 11/27/2011  . Major depressive disorder, recurrent episode  10/27/2011  . Generalized anxiety disorder 10/27/2011  . 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 Borderline personality disorder 10/27/2011  . Right foot pain 09/28/2011  . 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 Ganglion cyst 09/29/2009  . PES PLANUS 07/01/2008  . BIPOLAR DISORDER UNSPECIFIED 06/09/2008    , W, PT 05/14/2014, 1:49 PM  [facility] [address] [place], [place], [address] Phone: [phone]   Fax:  [phone]      

## 2014-05-14 NOTE — Progress Notes (Signed)
Patient ID: [name], female   DOB: 1971/01/14, 44 y.o.   MRN: [id number] Discharge Note  Patient:  [name] is an 44 y.o., female DOB:  07-04-1970, 44 y.o. Date of Admission:  04/30/2014  Date of Discharge:  05/18/2014  Reason for Admission:post inpatient and experiencing anxiety and depression  IOP Course:  Ms Popoca was an active participant in group.  She maintained her gains from her inpatient stay and dealt with some interpersonal stress generated in the group in a positive way.  She continued to work her job during the time in the group but may decide to cut back her hours some.  Mental Status at Discharge:decreased anxiety and depression, no suicidal ideation  Lab Results: No results found for this or any previous visit (from the past 51 hour(s)).   Current outpatient prescriptions:  .  clobetasol cream (TEMOVATE) 4.09 %, Apply 1 application topically 2 (two) times daily as needed (skin redness). , Disp: , Rfl:  .  Cyanocobalamin (VITAMIN B-12 PO), Take 1 tablet by mouth daily., Disp: , Rfl:  .  divalproex (DEPAKOTE ER) 500 MG 24 hr tablet, Take 3 tablets (1,500 mg total) by mouth at bedtime. For mood stabilization., Disp: 90 tablet, Rfl: 0 .  hydrochlorothiazide (HYDRODIURIL) 25 MG tablet, Take 1 tablet (25 mg total) by mouth daily. For blood pressure control., Disp: 30 tablet, Rfl: 0 .  lamoTRIgine (LAMICTAL) 200 MG tablet, Take 1 tablet (200 mg total) by mouth at bedtime., Disp: 30 tablet, Rfl: 0 .  LORazepam (ATIVAN) 1 MG tablet, Take 0.5 tablets (0.5 mg total) by mouth 2 (two) times daily., Disp: 30 tablet, Rfl: 0 .  lurasidone (LATUDA) 80 MG TABS tablet, Take 1 tablet (80 mg total) by mouth daily at 6 PM., Disp: 30 tablet, Rfl: 0 .  metoprolol succinate (TOPROL-XL) 100 MG 24 hr tablet, Take 1 tablet (100 mg total) by mouth daily. Take with or immediately following a meal for blood pressure control., Disp: 30 tablet, Rfl: 0 .  Multiple Vitamin (MULTIVITAMIN WITH MINERALS)  TABS tablet, Take 1 tablet by mouth daily. For nutritional supplementation., Disp: 30 tablet, Rfl: 0 .  promethazine (PHENERGAN) 25 MG tablet, Take 25 mg by mouth every 6 (six) hours as needed for nausea or vomiting., Disp: , Rfl:  .  sertraline (ZOLOFT) 50 MG tablet, Take 1 tablet (50 mg total) by mouth daily., Disp: 30 tablet, Rfl: 0 .  sodium chloride 0.9 % SOLN 100 mL with valproate 500 MG/5ML SOLN, To be infused over  a 15 minutes period, repeat once if partial relief., Disp: 500 mg, Rfl: 1 .  SUMAtriptan (IMITREX) 6 MG/0.5ML SOLN injection, Inject 0.5 mLs (6 mg total) into the skin as needed for migraine or headache. May repeat in 2 hours if headache persists or recurs., Disp: 6 vial, Rfl: 5  Axis Diagnosis:  Bipolar I current episode mixed, mild   Level of Care:  IOP  Discharge destination:  Other:  return to outpatient therapy and med management.  She plans to do the social skills group offered by the [facility]  Is patient on multiple antipsychotic therapies at discharge:  No    Has Patient had three or more failed trials of antipsychotic monotherapy by history:  Negative  Patient phone:  [phone] (home)  Patient address:   [name] [address],   Follow-up recommendations:  Activity:  continue current activity Diet:  continue current diet  Comments:  Did well in group and  is holding her own.  Still anxious  The patient received suicide prevention pamphlet:  Yes Belongings returned:  na  Clarene Reamer 05/14/2014, 10:52 AM

## 2014-05-15 ENCOUNTER — Other Ambulatory Visit (HOSPITAL_COMMUNITY): Payer: PPO | Admitting: Psychiatry

## 2014-05-15 DIAGNOSIS — F3162 Bipolar disorder, current episode mixed, moderate: Secondary | ICD-10-CM | POA: Diagnosis not present

## 2014-05-15 NOTE — Progress Notes (Signed)
    Daily Group Progress Note  Program: IOP  Group Time: 9:00-10:30  Participation Level: Active  Behavioral Response: Blaming and Monopolizing  Type of Therapy:  Group Therapy  Summary of Progress: Pt. Presented as talkative, but agitated and argumentative. Pt. Continues with pattern of singling out a group member's behavior and attacks her. Pt. Was redirected by therapist to focus on herself and personal issues that are sources of frustration i.e., uncertainty of job schedule, physical pain, and poor boundaries with her mother.      Group Time:  10:30-12:00  Participation Level:  Active  Behavioral Response: Appropriate  Type of Therapy: Psycho-education Group  Summary of Progress: Pt. Participated in grief and loss group facilitated by Jeanella Craze.   [facility]-PIOP[facility] PSYCH

## 2014-05-18 ENCOUNTER — Other Ambulatory Visit (HOSPITAL_COMMUNITY): Payer: PPO | Admitting: Psychiatry

## 2014-05-18 DIAGNOSIS — F3162 Bipolar disorder, current episode mixed, moderate: Secondary | ICD-10-CM

## 2014-05-18 NOTE — Progress Notes (Signed)
[name] is a 44 y.o. , single, employed, Caucasian female; who was transitioned from the inpatient unit at [facility]. Pt was admitted from 04-12-14 thru 04-24-14 due to Bipolar Disorder; mixed episode. Pt is well known to this Probation officer; due to a long psychiatric history. Prior to going into the hospital, pt symptoms included: Sadness, anxiety, SI with a plan to jump from a height, racing thoughts, increased energy level, irritability, indecisiveness, decreased sleep, no motivation and low self-esteem. Pt continued to struggle with all the symptoms, but is able to contract for safety at this time. Discussed safety options with pt at length. Stressors included: 1) Job Research officer, trade union) of five years. According to pt, there has been an increase with stress due to having many responsibilities. "I have asked that they lower my hours. I just recently worked 21 hours within three days because we were preparing for a visit from the Health and safety inspector. 2) Limited support (Loneliness): Pt stated her best friend recently moved to [place], [place]. "I don't have any friends." 3) Disability Issues Pt has had twenty three psychiatric admissions ([facility], [facility] and [facility]). Three previous suicide attempts (2002, 2005, 2009). Stated each time she walked towards traffic. One previous admit in MH-IOP ~ three- five years ago. Pt was a patient of Dr. Iona Coach for years. Pt has been seeing Dr. Caprice Beaver for ~ one year. Also sees Terri Lovelady, LCSW; hx of seeing Christianne Dolin, LCSW. Family Hx: Father (Bipolar Disorder). Pt completed MH-IOP today.  Reports feeling much better.  States she wasn't a group person until she attend MH-IOP.  Reports improved sleep and appetite.  Decreased anxiety and depression.  Denies SI/HI or A/V hallucinations.  A:  D/C today.  Will follow up with Dr. Caprice Beaver on 06-09-14 and Floria Raveling, LCSW on 06-19-14.  Encouraged support groups.  R:  Pt receptive.

## 2014-05-18 NOTE — Progress Notes (Signed)
    Daily Group Progress Note  Program: IOP  Group Time: 9:00-10:30  Participation Level: Active  Behavioral Response: Appropriate  Type of Therapy:  Psycho-education Group  Summary of Progress: Pt. Participated in medication management group with Jiles Garter.     Group Time: 10:30-12:00  Participation Level:  Active  Behavioral Response: Appropriate  Type of Therapy: Group Therapy  Summary of Progress: Pt. Prepared for her discharge from group today. Pt. Requested not to have discharge ceremony. Pt. Discussing challenge of finding moderation in her everyday activities so that she can sustain her recovery.  Nancie Neas, LPC

## 2014-05-18 NOTE — Patient Instructions (Signed)
Patient completed MH-IOP today.  Will follow up with Dr. Caprice Beaver on 06-09-14 and Floria Raveling, LCSW on 06-19-14.  Encouraged support groups.

## 2014-05-19 ENCOUNTER — Ambulatory Visit: Payer: PPO | Admitting: Physical Therapy

## 2014-05-19 ENCOUNTER — Other Ambulatory Visit (HOSPITAL_COMMUNITY): Payer: PPO

## 2014-05-19 ENCOUNTER — Encounter: Payer: Self-pay | Admitting: Physical Therapy

## 2014-05-19 DIAGNOSIS — M25511 Pain in right shoulder: Secondary | ICD-10-CM

## 2014-05-19 NOTE — Therapy (Signed)
[place], [place], [address] Phone: [phone]   Fax:  [phone]  Physical Therapy Treatment  Patient Details  Name: [name] MRN: [id number] Date of Birth: [birth year] Referring Provider:  Dene Gentry, MD  Encounter Date: 05/19/2014      PT End of Session - 05/19/14 1354    Visit Number 15   Date for PT Re-Evaluation 06/01/14   PT Start Time 1320   PT Stop Time 1421   PT Time Calculation (min) 61 min      Past Medical History  Diagnosis Date  . Bipolar disorder   . Eczema   . Pes planus   . Achilles tendinitis   . Hypertension   . Migraine   . Achilles tendinitis   . Ganglion cyst 09/29/2009    left wrist (2 cyst)  . Arthritis   . Allergy   . Lipoma   . ADHD (attention deficit hyperactivity disorder)   . Hyperprolactinemia   . Bipolar affective   . Personality disorder     Past Surgical History  Procedure Laterality Date  . Tumor resection left thigh    . Ankle surgery  12/88    left   . Lipoma removal    . Ganglion cyst excision  2011  . Chest nodule  1990?    rt chest wall nodule removal  . Shoulder surgery  01/13/2011    right, partial tear  . Right bunioectomy      There were no vitals filed for this visit.  Visit Diagnosis:  Right shoulder pain      Subjective Assessment - 05/19/14 1326    Subjective Reports that she hit her shoulder on a rack at work over the weekend two times.     Currently in Pain? Yes   Pain Score 8    Pain Location Shoulder   Pain Orientation Right   Pain Descriptors / Indicators Aching;Dull   Pain Type Acute pain   Pain Onset 1 to 4 weeks ago   Pain Frequency Intermittent            [facility] PT Assessment - 05/19/14 0001    AROM   AROM Assessment Site Shoulder   Right/Left Shoulder Right   Right Shoulder Flexion 145 Degrees   Right Shoulder ABduction 145 Degrees                     [facility] Adult  PT Treatment/Exercise - 05/19/14 0001    Neck Exercises: Machines for Strengthening   UBE (Upper Arm Bike) Level 5 x 5 minutes   Cybex Row 25# 2x15   Cybex Chest Press 5# 2x15   Power Tower lat pulls 20# 2x15   Shoulder Exercises: Seated   Other Seated Exercises 4# bent over row and extension   Shoulder Exercises: Standing   Other Standing Exercises rhythmic stabilization   Shoulder Exercises: ROM/Strengthening   Wall Pushups 20 reps   "W" Arms 3#   X to V Arms 3#   Rhythmic Stabilization, Seated standing iwth 6.6 # ball   Other ROM/Strengthening Exercises 8# shrugs with upper trap and levator stretches   Shoulder Exercises: Body Blade   External Rotation 30 seconds;2 reps   Internal Rotation 30 seconds;2 reps   Electrical Stimulation   Electrical Stimulation Location right rhomboid   Electrical Stimulation Parameters IFC   Electrical Stimulation Goals Pain   Manual Therapy   Manual Therapy Myofascial  release   Myofascial Release right rhomboid into scapula                     PT Long Term Goals - 04/10/14 0845    PT LONG TERM GOAL #1   Title decrease pain 50%   Status Partially Met   PT LONG TERM GOAL #2   Title report 50% less difficulty at work   Status Partially Met               Plan - 05/19/14 1354    Clinical Impression Statement I was doing really good until I hit my shoulder on a rack at work.  Now pain in the top of the shoulder an 8/10   PT Next Visit Plan see if we can continue with the plan to work on strength, or if the hit to the sholder really set her back more than just pain   Consulted and Agree with Plan of Care Patient        Problem List Patient Active Problem List   Diagnosis Date Noted  . Phonophobia 05/04/2014  . Photophobia of both eyes 05/04/2014  . Emotionally unstable borderline personality disorder 05/04/2014  . Anxiety state 05/04/2014  . Nausea with vomiting 05/04/2014  . Mixed bipolar I disorder   . Bipolar I  disorder, most recent episode mixed 04/18/2014  . Bipolar affective disorder, depressed, severe 04/12/2014  . Suicidal ideation 04/12/2014  . Injury of right shoulder and upper arm 02/17/2014  . Left leg pain 06/03/2013  . Right hip pain 06/03/2013  . Migraine with status migrainosus 01/08/2013  . Personality disorder   . Chronic migraine 05/08/2012  . Contact dermatitis 11/27/2011  . Major depressive disorder, recurrent episode 10/27/2011  . Generalized anxiety disorder 10/27/2011  . 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 Borderline personality disorder 10/27/2011  . Right foot pain 09/28/2011  . 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 Ganglion cyst 09/29/2009  . PES PLANUS 07/01/2008  . BIPOLAR DISORDER UNSPECIFIED 06/09/2008    Sumner Boast, PT 05/19/2014, 1:58 PM  [address] [place], [place], [address] Phone: [phone]   Fax:  [phone]

## 2014-05-20 ENCOUNTER — Ambulatory Visit (INDEPENDENT_AMBULATORY_CARE_PROVIDER_SITE_OTHER): Payer: PPO | Admitting: Psychology

## 2014-05-20 ENCOUNTER — Other Ambulatory Visit (HOSPITAL_COMMUNITY): Payer: PPO

## 2014-05-20 ENCOUNTER — Telehealth: Payer: Self-pay | Admitting: Neurology

## 2014-05-20 DIAGNOSIS — F3341 Major depressive disorder, recurrent, in partial remission: Secondary | ICD-10-CM

## 2014-05-20 NOTE — Telephone Encounter (Signed)
I called back and spoke with the patient.  She was given a brochure about Zecuity from provider at last OV and was following up regarding this med.

## 2014-05-20 NOTE — Telephone Encounter (Signed)
Patient checking status of patch for migraines as discussed in last OV on 05/04/14.  Please call and advise.

## 2014-05-20 NOTE — Telephone Encounter (Signed)
Patient called back with name of patch, ZECUITY 6 mg last for 4 hours.  FYI

## 2014-05-21 ENCOUNTER — Encounter: Payer: Self-pay | Admitting: Physical Therapy

## 2014-05-21 ENCOUNTER — Other Ambulatory Visit (HOSPITAL_COMMUNITY): Payer: PPO

## 2014-05-21 ENCOUNTER — Ambulatory Visit: Payer: PPO | Admitting: Physical Therapy

## 2014-05-21 DIAGNOSIS — M25511 Pain in right shoulder: Secondary | ICD-10-CM | POA: Diagnosis not present

## 2014-05-21 NOTE — Telephone Encounter (Signed)
From the 05-04-14 note, in the history part (3rd paragraph) states that pt needs to have documentation that triptans have not helped, and would consider zecuity.  (was given information).  Dr. Brett Fairy then refilled the imitrex.

## 2014-05-21 NOTE — Therapy (Signed)
[facility] [place], [place], [address] Phone: [phone]   Fax:  [phone]  Physical Therapy Treatment  Patient Details  Name: [name] MRN: [id number] Date of Birth: 04/29/1970 Referring Provider:  Dene Gentry, MD  Encounter Date: 05/21/2014      PT End of Session - 05/21/14 1336    Visit Number 16   Date for PT Re-Evaluation 06/01/14   PT Start Time 1320   PT Stop Time 1415   PT Time Calculation (min) 55 min      Past Medical History  Diagnosis Date  . Bipolar disorder   . Eczema   . Pes planus   . Achilles tendinitis   . Hypertension   . Migraine   . Achilles tendinitis   . Ganglion cyst 09/29/2009    left wrist (2 cyst)  . Arthritis   . Allergy   . Lipoma   . ADHD (attention deficit hyperactivity disorder)   . Hyperprolactinemia   . Bipolar affective   . Personality disorder     Past Surgical History  Procedure Laterality Date  . Tumor resection left thigh    . Ankle surgery  12/88    left   . Lipoma removal    . Ganglion cyst excision  2011  . Chest nodule  1990?    rt chest wall nodule removal  . Shoulder surgery  01/13/2011    right, partial tear  . Right bunioectomy      There were no vitals filed for this visit.  Visit Diagnosis:  Right shoulder pain      Subjective Assessment - 05/21/14 1321    Subjective That last treatment really helped from where I hit it.   Currently in Pain? Yes   Pain Score 5    Pain Location Shoulder   Pain Orientation Right   Pain Descriptors / Indicators Aching;Dull   Pain Type Acute pain   Pain Onset 1 to 4 weeks ago   Aggravating Factors  I hit it on a rack and that has caused more pain   Pain Relieving Factors treatment                         [facility] Adult PT Treatment/Exercise - 05/21/14 0001    Neck Exercises: Machines for Strengthening   UBE (Upper Arm Bike) Level 5 x 5 minutes   Cybex Row  25# 2x15   Cybex Chest Press 5# 2x15   Power Tower lat pulls 20# 2x15   Other Machines for Strengthening Body blade all motions 20 seconds 2 sets each   Shoulder Exercises: Seated   Other Seated Exercises 4# bent over row and extension   Shoulder Exercises: Standing   Other Standing Exercises rhythmic stabilization   Shoulder Exercises: ROM/Strengthening   UBE (Upper Arm Bike) 5 minutes   "W" Arms 3#   X to V Arms 3#   Electrical Stimulation   Electrical Stimulation Location right rhomboid   Electrical Stimulation Parameters IFC   Electrical Stimulation Goals Pain   Manual Therapy   Manual Therapy Myofascial release   Myofascial Release right rhomboid into scapula                     PT Long Term Goals - 04/10/14 0845    PT LONG TERM GOAL #1   Title decrease pain 50%   Status Partially Met   PT  LONG TERM GOAL #2   Title report 50% less difficulty at work   Status Partially Met               Plan - 05/21/14 1337    Clinical Impression Statement I feel better after last treatment, patient with decreased ROM, spasms and knots in the upper trap and rhomboids, with biggest c/o is right shoulder pain   PT Next Visit Plan see if we can continue with the plan to work on strength, or if the hit to the sholder really set her back more than just pain   Consulted and Agree with Plan of Care Patient        Problem List Patient Active Problem List   Diagnosis Date Noted  . Phonophobia 05/04/2014  . Photophobia of both eyes 05/04/2014  . Emotionally unstable borderline personality disorder 05/04/2014  . Anxiety state 05/04/2014  . Nausea with vomiting 05/04/2014  . Mixed bipolar I disorder   . Bipolar I disorder, most recent episode mixed 04/18/2014  . Bipolar affective disorder, depressed, severe 04/12/2014  . Suicidal ideation 04/12/2014  . Injury of right shoulder and upper arm 02/17/2014  . Left leg pain 06/03/2013  . Right hip pain 06/03/2013  .  Migraine with status migrainosus 01/08/2013  . Personality disorder   . Chronic migraine 05/08/2012  . Contact dermatitis 11/27/2011  . Major depressive disorder, recurrent episode 10/27/2011  . Generalized anxiety disorder 10/27/2011  . 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 Borderline personality disorder 10/27/2011  . Right foot pain 09/28/2011  . 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 Ganglion cyst 09/29/2009  . PES PLANUS 07/01/2008  . BIPOLAR DISORDER UNSPECIFIED 06/09/2008    Sumner Boast, PT 05/21/2014, 1:38 PM  [address] [place], [place], [address] Phone: [phone]   Fax:  [phone]

## 2014-05-21 NOTE — Telephone Encounter (Signed)
I called the patient back.  Got no answer.  Left message. 

## 2014-05-22 ENCOUNTER — Other Ambulatory Visit (HOSPITAL_COMMUNITY): Payer: PPO

## 2014-05-25 ENCOUNTER — Other Ambulatory Visit (HOSPITAL_COMMUNITY): Payer: PPO

## 2014-05-26 ENCOUNTER — Other Ambulatory Visit (HOSPITAL_COMMUNITY): Payer: PPO

## 2014-05-26 ENCOUNTER — Ambulatory Visit: Payer: PPO | Admitting: Physical Therapy

## 2014-05-26 ENCOUNTER — Encounter: Payer: Self-pay | Admitting: Physical Therapy

## 2014-05-26 DIAGNOSIS — M25511 Pain in right shoulder: Secondary | ICD-10-CM

## 2014-05-26 NOTE — Therapy (Signed)
[facility] [place], [place], [address] Phone: [phone]   Fax:  [phone]  Physical Therapy Treatment  Patient Details  Name: [name] MRN: [id number] Date of Birth: 30-Aug-1970 Referring Provider:  Dene Gentry, MD  Encounter Date: 05/26/2014      PT End of Session - 05/26/14 1718    Visit Number 17   Date for PT Re-Evaluation 06/01/14   PT Start Time 1702   PT Stop Time 8185   PT Time Calculation (min) 53 min      Past Medical History  Diagnosis Date  . Bipolar disorder   . Eczema   . Pes planus   . Achilles tendinitis   . Hypertension   . Migraine   . Achilles tendinitis   . Ganglion cyst 09/29/2009    left wrist (2 cyst)  . Arthritis   . Allergy   . Lipoma   . ADHD (attention deficit hyperactivity disorder)   . Hyperprolactinemia   . Bipolar affective   . Personality disorder     Past Surgical History  Procedure Laterality Date  . Tumor resection left thigh    . Ankle surgery  12/88    left   . Lipoma removal    . Ganglion cyst excision  2011  . Chest nodule  1990?    rt chest wall nodule removal  . Shoulder surgery  01/13/2011    right, partial tear  . Right bunioectomy      There were no vitals filed for this visit.  Visit Diagnosis:  Right shoulder pain      Subjective Assessment - 05/26/14 1706    Subjective My shoulder is doing better.   Currently in Pain? Yes   Pain Score 4    Pain Location Shoulder   Pain Orientation Right   Pain Descriptors / Indicators Aching                         [facility] Adult PT Treatment/Exercise - 05/26/14 0001    Neck Exercises: Machines for Strengthening   UBE (Upper Arm Bike) Level 5 x 6 minutes   Cybex Row 25# 2x15   Cybex Chest Press 5# 2x15   Power Tower lat pulls 25# 2x15   Shoulder Exercises: Sidelying   Theraband Level (Shoulder External Rotation) --  on pulley   External Rotation Weight  (lbs) 5   Shoulder Exercises: Standing   Other Standing Exercises rhythmic stabilization   Shoulder Exercises: Body Blade   External Rotation 30 seconds;2 reps   Internal Rotation 30 seconds;2 reps   Electrical Stimulation   Electrical Stimulation Location right rhomboid   Electrical Stimulation Parameters IFC   Electrical Stimulation Goals Pain   Manual Therapy   Manual Therapy Myofascial release   Myofascial Release right rhomboid into scapula                     PT Long Term Goals - 04/10/14 0845    PT LONG TERM GOAL #1   Title decrease pain 50%   Status Partially Met   PT LONG TERM GOAL #2   Title report 50% less difficulty at work   Status Partially Met               Plan - 05/26/14 1718    Clinical Impression Statement Patient with pain in the right shoulder, she is demonstrating increased strength and function,  the pain limits some ROM and function, she has been able to work more hours recently   PT Next Visit Plan add exercises, funtional        Problem List Patient Active Problem List   Diagnosis Date Noted  . Phonophobia 05/04/2014  . Photophobia of both eyes 05/04/2014  . Emotionally unstable borderline personality disorder 05/04/2014  . Anxiety state 05/04/2014  . Nausea with vomiting 05/04/2014  . Mixed bipolar I disorder   . Bipolar I disorder, most recent episode mixed 04/18/2014  . Bipolar affective disorder, depressed, severe 04/12/2014  . Suicidal ideation 04/12/2014  . Injury of right shoulder and upper arm 02/17/2014  . Left leg pain 06/03/2013  . Right hip pain 06/03/2013  . Migraine with status migrainosus 01/08/2013  . Personality disorder   . Chronic migraine 05/08/2012  . Contact dermatitis 11/27/2011  . Major depressive disorder, recurrent episode 10/27/2011  . Generalized anxiety disorder 10/27/2011  . 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 Borderline personality disorder  10/27/2011  . Right foot pain 09/28/2011  . 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 Ganglion cyst 09/29/2009  . PES PLANUS 07/01/2008  . BIPOLAR DISORDER UNSPECIFIED 06/09/2008    Sumner Boast, PT 05/26/2014, 5:36 PM  [facility] [place], [place], [address] Phone: [phone]   Fax:  [phone]

## 2014-05-27 ENCOUNTER — Ambulatory Visit (INDEPENDENT_AMBULATORY_CARE_PROVIDER_SITE_OTHER): Payer: PPO | Admitting: Psychology

## 2014-05-27 ENCOUNTER — Telehealth: Payer: Self-pay

## 2014-05-27 ENCOUNTER — Other Ambulatory Visit (HOSPITAL_COMMUNITY): Payer: PPO

## 2014-05-27 DIAGNOSIS — G43501 Persistent migraine aura without cerebral infarction, not intractable, with status migrainosus: Secondary | ICD-10-CM

## 2014-05-27 DIAGNOSIS — F3341 Major depressive disorder, recurrent, in partial remission: Secondary | ICD-10-CM

## 2014-05-27 NOTE — Telephone Encounter (Signed)
Patient says Sumatriptan Injections are not beneficial.  She would like to know if provider would proceed with ordering Zecuity.  Please advise.  Thank you.

## 2014-05-28 ENCOUNTER — Other Ambulatory Visit (HOSPITAL_COMMUNITY): Payer: PPO

## 2014-05-28 ENCOUNTER — Ambulatory Visit (INDEPENDENT_AMBULATORY_CARE_PROVIDER_SITE_OTHER): Payer: PPO | Admitting: Family Medicine

## 2014-05-28 ENCOUNTER — Encounter: Payer: Self-pay | Admitting: Family Medicine

## 2014-05-28 VITALS — BP 126/87 | HR 81 | Ht 70.0 in | Wt 242.0 lb

## 2014-05-28 DIAGNOSIS — T148 Other injury of unspecified body region: Secondary | ICD-10-CM | POA: Diagnosis not present

## 2014-05-28 DIAGNOSIS — T07XXXA Unspecified multiple injuries, initial encounter: Secondary | ICD-10-CM

## 2014-05-28 DIAGNOSIS — S86811A Strain of other muscle(s) and tendon(s) at lower leg level, right leg, initial encounter: Secondary | ICD-10-CM | POA: Diagnosis not present

## 2014-05-28 DIAGNOSIS — S93401A Sprain of unspecified ligament of right ankle, initial encounter: Secondary | ICD-10-CM

## 2014-05-28 DIAGNOSIS — S86111A Strain of other muscle(s) and tendon(s) of posterior muscle group at lower leg level, right leg, initial encounter: Secondary | ICD-10-CM

## 2014-05-28 DIAGNOSIS — S4991XD Unspecified injury of right shoulder and upper arm, subsequent encounter: Secondary | ICD-10-CM

## 2014-05-28 MED ORDER — METHOCARBAMOL 500 MG PO TABS: 500.0000 mg | ORAL_TABLET | Freq: Four times a day (QID) | ORAL | Status: DC | PRN

## 2014-05-28 MED ORDER — DICLOFENAC SODIUM 75 MG PO TBEC: 75.0000 mg | DELAYED_RELEASE_TABLET | Freq: Two times a day (BID) | ORAL | Status: DC

## 2014-05-28 NOTE — Patient Instructions (Addendum)
You have contusions, right ankle sprain, right rotator cuff strain, and right calf strain. I would expect these issues to improve quite a bit over 2-3 weeks. Continue with your physical therapy for your shoulder. Ankle brace when up and walking around. Icing 15 minutes at a time 3-4 times a day. Diclofenac twice a day with food for pain and inflammation. Robaxin as needed for spasms. Follow up with me in 4 weeks.

## 2014-05-29 ENCOUNTER — Other Ambulatory Visit (HOSPITAL_COMMUNITY): Payer: PPO

## 2014-06-01 ENCOUNTER — Other Ambulatory Visit (HOSPITAL_COMMUNITY): Payer: PPO

## 2014-06-01 DIAGNOSIS — S93401A Sprain of unspecified ligament of right ankle, initial encounter: Secondary | ICD-10-CM | POA: Insufficient documentation

## 2014-06-01 DIAGNOSIS — S86111A Strain of other muscle(s) and tendon(s) of posterior muscle group at lower leg level, right leg, initial encounter: Secondary | ICD-10-CM | POA: Insufficient documentation

## 2014-06-01 DIAGNOSIS — T07XXXA Unspecified multiple injuries, initial encounter: Secondary | ICD-10-CM | POA: Insufficient documentation

## 2014-06-01 NOTE — Progress Notes (Signed)
PCP: [name], MD  Subjective:   HPI: Patient is a 44 y.o. female here for fall.  Patient reports a couple nights ago had a nightmare x 2 and fell out of bed onto her right side she believes. Landed on right shoulder, right hip, some pain in right medial calf and ankle. Difficulty sleeping due to pain. Is doing PT currently for her shoulder.  Past Medical History  Diagnosis Date  . Bipolar disorder   . Eczema   . Pes planus   . Achilles tendinitis   . Hypertension   . Migraine   . Achilles tendinitis   . Ganglion cyst 09/29/2009    left wrist (2 cyst)  . Arthritis   . Allergy   . Lipoma   . ADHD (attention deficit hyperactivity disorder)   . Hyperprolactinemia   . Bipolar affective   . Personality disorder     Current Outpatient Prescriptions on File Prior to Visit  Medication Sig Dispense Refill  . clobetasol cream (TEMOVATE) 3.71 % Apply 1 application topically 2 (two) times daily as needed (skin redness).     . Cyanocobalamin (VITAMIN B-12 PO) Take 1 tablet by mouth daily.    . divalproex (DEPAKOTE ER) 500 MG 24 hr tablet Take 3 tablets (1,500 mg total) by mouth at bedtime. For mood stabilization. 90 tablet 0  . hydrochlorothiazide (HYDRODIURIL) 25 MG tablet Take 1 tablet (25 mg total) by mouth daily. For blood pressure control. 30 tablet 0  . lamoTRIgine (LAMICTAL) 200 MG tablet Take 1 tablet (200 mg total) by mouth at bedtime. 30 tablet 0  . lurasidone (LATUDA) 80 MG TABS tablet Take 1 tablet (80 mg total) by mouth daily at 6 PM. 30 tablet 0  . metoprolol succinate (TOPROL-XL) 100 MG 24 hr tablet Take 1 tablet (100 mg total) by mouth daily. Take with or immediately following a meal for blood pressure control. 30 tablet 0  . Multiple Vitamin (MULTIVITAMIN WITH MINERALS) TABS tablet Take 1 tablet by mouth daily. For nutritional supplementation. 30 tablet 0  . promethazine (PHENERGAN) 25 MG tablet Take 25 mg by mouth every 6 (six) hours as needed for nausea or  vomiting.    . sertraline (ZOLOFT) 50 MG tablet Take 1 tablet (50 mg total) by mouth daily. 30 tablet 0  . sodium chloride 0.9 % SOLN 100 mL with valproate 500 MG/5ML SOLN To be infused over  a 15 minutes period, repeat once if partial relief. 500 mg 1   No current facility-administered medications on file prior to visit.    Past Surgical History  Procedure Laterality Date  . Tumor resection left thigh    . Ankle surgery  12/88    left   . Lipoma removal    . Ganglion cyst excision  2011  . Chest nodule  1990?    rt chest wall nodule removal  . Shoulder surgery  01/13/2011    right, partial tear  . Right bunioectomy      Allergies  Allergen Reactions  . Adhesive [Tape] Itching and Rash    Also reacted to Steri Strips and Band-Aids.  . Dilaudid [Hydromorphone Hcl] Itching  . Morphine Nausea And Vomiting  . Penicillins Hives  . Percocet [Oxycodone-Acetaminophen] Itching  . Prednisone Hives  . Provera [Medroxyprogesterone Acetate] Other (See Comments)    Causes manic episodes  . Ultram [Tramadol Hcl] Itching    History   Social History  . Marital Status: Single    Spouse Name: N/A  . Number  of Children: 0  . Years of Education: N/A   Occupational History  .  [employer]   Social History Main Topics  . Smoking status: Never Smoker   . Smokeless tobacco: Never Used  . Alcohol Use: No  . Drug Use: No  . Sexual Activity: No   Other Topics Concern  . Not on file   Social History Narrative   Caffeine  2 sodas daily, 1 cup coffee daily.    Family History  Problem Relation Age of Onset  . Hypertension Mother   . Hyperlipidemia Mother   . Heart attack Father   . Heart disease Father   . Hypertension Father   . Bipolar disorder Father   . Diabetes Paternal Grandfather   . Heart disease Maternal Aunt   . Breast cancer Maternal Aunt   . Heart disease Maternal Grandmother   . Cancer Maternal Grandmother     colon    BP 126/87 mmHg  Pulse 81  Ht 5\' 10"  (1.778 m)   Wt 242 lb (109.77 kg)  BMI 34.72 kg/m2  Review of Systems: See HPI above.    Objective:  Physical Exam:  Gen: NAD  Right shoulder: No swelling, ecchymoses.  No gross deformity. No TTP. FROM with painful arc. Mild positive Hawkins, negative Neers. Negative Yergasons. Strength 5/5 with empty can and resisted internal/external rotation.  Pain empty can, mild with ER. Negative apprehension. NV intact distally.  Right ankle/calf: No gross deformity, swelling, ecchymoses FROM TTP medial right gastroc, over ATFL.  No other tenderness. 1+ ant drawer and talar tilt.   Negative syndesmotic compression. Thompsons test negative. NV intact distally.    Right hip: No gross deformity, swelling TTP greater trochanter. FROM hip without pain.  Assessment & Plan:  1. Right rotator cuff strain - with underlying impingement.  Continue with PT.  Diclofenac with robaxin as needed for spasms.  Icing as needed.  2. Right ankle sprain - ASO provided.  Diclofenac with robaxin as needed.  Consider adding PT for this if not improving.  F/u in 4 weeks.  3. Contusions - of hip and shoulder.  Reassured.  4. Right calf strain - discussed sleeve, calf raises - mild.

## 2014-06-01 NOTE — Assessment & Plan Note (Signed)
ASO provided.  Diclofenac with robaxin as needed.  Consider adding PT for this if not improving.  F/u in 4 weeks.

## 2014-06-01 NOTE — Assessment & Plan Note (Signed)
discussed sleeve, calf raises - mild.

## 2014-06-01 NOTE — Assessment & Plan Note (Signed)
Right rotator cuff strain - with underlying impingement.  Continue with PT.  Diclofenac with robaxin as needed for spasms.  Icing as needed.

## 2014-06-01 NOTE — Assessment & Plan Note (Signed)
of hip and shoulder.  Reassured.

## 2014-06-02 ENCOUNTER — Encounter: Payer: Self-pay | Admitting: Physical Therapy

## 2014-06-02 ENCOUNTER — Ambulatory Visit: Payer: PPO | Attending: Family Medicine | Admitting: Physical Therapy

## 2014-06-02 ENCOUNTER — Other Ambulatory Visit (HOSPITAL_COMMUNITY): Payer: PPO

## 2014-06-02 DIAGNOSIS — M542 Cervicalgia: Secondary | ICD-10-CM | POA: Diagnosis not present

## 2014-06-02 DIAGNOSIS — M25521 Pain in right elbow: Secondary | ICD-10-CM | POA: Insufficient documentation

## 2014-06-02 DIAGNOSIS — M25511 Pain in right shoulder: Secondary | ICD-10-CM | POA: Insufficient documentation

## 2014-06-02 NOTE — Therapy (Signed)
[facility], [place], [address] Phone: [phone]   Fax:  [phone]  Physical Therapy Treatment  Patient Details  Name: [name] MRN: [id number] Date of Birth: 15-Aug-1970 Referring Provider:  Dene Gentry, MD  Encounter Date: 06/02/2014      PT End of Session - 06/02/14 1014    Visit Number 18   Date for PT Re-Evaluation 06/01/14   PT Start Time 0933   PT Stop Time 1031   PT Time Calculation (min) 58 min      Past Medical History  Diagnosis Date  . Bipolar disorder   . Eczema   . Pes planus   . Achilles tendinitis   . Hypertension   . Migraine   . Achilles tendinitis   . Ganglion cyst 09/29/2009    left wrist (2 cyst)  . Arthritis   . Allergy   . Lipoma   . ADHD (attention deficit hyperactivity disorder)   . Hyperprolactinemia   . Bipolar affective   . Personality disorder     Past Surgical History  Procedure Laterality Date  . Tumor resection left thigh    . Ankle surgery  12/88    left   . Lipoma removal    . Ganglion cyst excision  2011  . Chest nodule  1990?    rt chest wall nodule removal  . Shoulder surgery  01/13/2011    right, partial tear  . Right bunioectomy      There were no vitals filed for this visit.  Visit Diagnosis:  Right shoulder pain - Plan: PT plan of care cert/re-cert      Subjective Assessment - 06/02/14 0935    Subjective I worked a lot over the weekend, reports she is pretty sore.  She had a fall last week that twisted her ankle and she thinks she hit her shoulder as well   Currently in Pain? Yes   Pain Score 5    Pain Location Shoulder   Pain Orientation Right   Pain Descriptors / Indicators Aching;Tightness   Pain Type Acute pain   Aggravating Factors  Working, standing and folding clothers   Pain Relieving Factors this treatment                         [facility] Adult PT Treatment/Exercise - 06/02/14 0001     Neck Exercises: Machines for Strengthening   UBE (Upper Arm Bike) Level 5 x 6 minutes   Cybex Row 35# 2x15   Cybex Chest Press 10# 2x15   Power Tower lat pulls 35# 2x15   Other Machines for Strengthening 5# scap stabilization 3 ways   Other Machines for Strengthening 10# obliques on pulleys   Shoulder Exercises: Seated   Other Seated Exercises 5# bent over row and extension   Electrical Stimulation   Electrical Stimulation Location right rhomboid   Electrical Stimulation Parameters IFC   Electrical Stimulation Goals Pain   Manual Therapy   Manual Therapy Myofascial release   Myofascial Release right rhomboid into scapula                     PT Long Term Goals - 04/10/14 0845    PT LONG TERM GOAL #1   Title decrease pain 50%   Status Partially Met   PT LONG TERM GOAL #2   Title report 50% less difficulty at work   Status Partially  Met               Plan - 06/02/14 1014    Clinical Impression Statement Reports that she had a nightmare last week and fell out of bed, hurt her ankle and her shoulder, now with some incresaed spasms   Consulted and Agree with Plan of Care Patient        Problem List Patient Active Problem List   Diagnosis Date Noted  . Right ankle sprain 06/01/2014  . Contusion, multiple sites 06/01/2014  . Strain of right gastrocnemius muscle 06/01/2014  . Phonophobia 05/04/2014  . Photophobia of both eyes 05/04/2014  . Emotionally unstable borderline personality disorder 05/04/2014  . Anxiety state 05/04/2014  . Nausea with vomiting 05/04/2014  . Mixed bipolar I disorder   . Bipolar I disorder, most recent episode mixed 04/18/2014  . Bipolar affective disorder, depressed, severe 04/12/2014  . Suicidal ideation 04/12/2014  . Injury of right shoulder and upper arm 02/17/2014  . Left leg pain 06/03/2013  . Right hip pain 06/03/2013  . Migraine with status migrainosus 01/08/2013  . Personality disorder   . Chronic migraine  05/08/2012  . Contact dermatitis 11/27/2011  . Major depressive disorder, recurrent episode 10/27/2011  . Generalized anxiety disorder 10/27/2011  . 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 Borderline personality disorder 10/27/2011  . Right foot pain 09/28/2011  . 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 Ganglion cyst 09/29/2009  . PES PLANUS 07/01/2008  . BIPOLAR DISORDER UNSPECIFIED 06/09/2008    [name], PT 06/02/2014, 10:17 AM  [address] [place], [place], [address] Phone: [phone]   Fax:  [phone]

## 2014-06-03 ENCOUNTER — Other Ambulatory Visit (HOSPITAL_COMMUNITY): Payer: PPO

## 2014-06-03 ENCOUNTER — Ambulatory Visit: Payer: Self-pay | Admitting: Psychology

## 2014-06-04 ENCOUNTER — Other Ambulatory Visit (HOSPITAL_COMMUNITY): Payer: PPO

## 2014-06-05 ENCOUNTER — Other Ambulatory Visit (HOSPITAL_COMMUNITY): Payer: PPO

## 2014-06-05 ENCOUNTER — Ambulatory Visit: Payer: PPO | Admitting: Physical Therapy

## 2014-06-05 ENCOUNTER — Encounter: Payer: Self-pay | Admitting: Physical Therapy

## 2014-06-05 DIAGNOSIS — M25511 Pain in right shoulder: Secondary | ICD-10-CM

## 2014-06-05 NOTE — Therapy (Signed)
[place], [place], [address] Phone: [phone]   Fax:  [phone]  Physical Therapy Treatment  Patient Details  Name: [name] MRN: [id number] Date of Birth: [birth year] Referring Provider:  Dene Gentry, MD  Encounter Date: 06/05/2014      PT End of Session - 06/05/14 1013    Visit Number 19   Date for PT Re-Evaluation 06/01/14   PT Start Time 0930   PT Stop Time 1030   PT Time Calculation (min) 60 min      Past Medical History  Diagnosis Date  . Bipolar disorder   . Eczema   . Pes planus   . Achilles tendinitis   . Hypertension   . Migraine   . Achilles tendinitis   . Ganglion cyst 09/29/2009    left wrist (2 cyst)  . Arthritis   . Allergy   . Lipoma   . ADHD (attention deficit hyperactivity disorder)   . Hyperprolactinemia   . Bipolar affective   . Personality disorder     Past Surgical History  Procedure Laterality Date  . Tumor resection left thigh    . Ankle surgery  12/88    left   . Lipoma removal    . Ganglion cyst excision  2011  . Chest nodule  1990?    rt chest wall nodule removal  . Shoulder surgery  01/13/2011    right, partial tear  . Right bunioectomy      There were no vitals filed for this visit.  Visit Diagnosis:  Right shoulder pain      Subjective Assessment - 06/05/14 0932    Subjective Not bad, feeling stronger   Currently in Pain? Yes   Pain Score 4    Pain Location Shoulder   Pain Orientation Right   Pain Descriptors / Indicators Aching   Pain Frequency Constant                         [facility] Adult PT Treatment/Exercise - 06/05/14 0001    Neck Exercises: Machines for Strengthening   UBE (Upper Arm Bike) Level 5 x 6 minutes   Cybex Row 35# 2x15   Cybex Chest Press 10# 2x15   Power Tower lat pulls 35# 2x15   Other Machines for Strengthening 5# scap stabilization 3 ways   Other Machines for Strengthening  10# obliques on pulleys   Shoulder Exercises: Standing   Other Standing Exercises rhythmic stabilization   Other Standing Exercises overhead heavy ball carry   Shoulder Exercises: ROM/Strengthening   Wall Pushups 20 reps   Shoulder Exercises: Isometric Strengthening   External Rotation Theraband   Theraband Level (External Rotation) Level 2 (Red)   Shoulder Exercises: Stretch   Star Gazer Stretch 3 reps;20 seconds   Electrical Stimulation   Electrical Stimulation Location right rhomboid   Electrical Stimulation Parameters IFC   Electrical Stimulation Goals Pain   Manual Therapy   Manual Therapy Myofascial release   Myofascial Release right rhomboid into scapula                     PT Long Term Goals - 04/10/14 0845    PT LONG TERM GOAL #1   Title decrease pain 50%   Status Partially Met   PT LONG TERM GOAL #2   Title report 50% less difficulty at work   Status Partially Met  Plan - 06/05/14 1013    Clinical Impression Statement Working on knots and pain, as well as scapular and shoulder stabilization   PT Next Visit Plan add exercises, funtional   Consulted and Agree with Plan of Care Patient        Problem List Patient Active Problem List   Diagnosis Date Noted  . Right ankle sprain 06/01/2014  . Contusion, multiple sites 06/01/2014  . Strain of right gastrocnemius muscle 06/01/2014  . Phonophobia 05/04/2014  . Photophobia of both eyes 05/04/2014  . Emotionally unstable borderline personality disorder 05/04/2014  . Anxiety state 05/04/2014  . Nausea with vomiting 05/04/2014  . Mixed bipolar I disorder   . Bipolar I disorder, most recent episode mixed 04/18/2014  . Bipolar affective disorder, depressed, severe 04/12/2014  . Suicidal ideation 04/12/2014  . Injury of right shoulder and upper arm 02/17/2014  . Left leg pain 06/03/2013  . Right hip pain 06/03/2013  . Migraine with status migrainosus 01/08/2013  . Personality  disorder   . Chronic migraine 05/08/2012  . Contact dermatitis 11/27/2011  . Major depressive disorder, recurrent episode 10/27/2011  . Generalized anxiety disorder 10/27/2011  . 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 Borderline personality disorder 10/27/2011  . Right foot pain 09/28/2011  . 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 Ganglion cyst 09/29/2009  . PES PLANUS 07/01/2008  . BIPOLAR DISORDER UNSPECIFIED 06/09/2008    Sumner Boast, PT 06/05/2014, 10:14 AM  [place], [place], [address] Phone: [phone]   Fax:  [phone]

## 2014-06-08 ENCOUNTER — Other Ambulatory Visit (HOSPITAL_COMMUNITY): Payer: PPO

## 2014-06-09 ENCOUNTER — Ambulatory Visit: Payer: PPO | Admitting: Physical Therapy

## 2014-06-09 ENCOUNTER — Other Ambulatory Visit (HOSPITAL_COMMUNITY): Payer: PPO

## 2014-06-09 ENCOUNTER — Encounter: Payer: Self-pay | Admitting: Physical Therapy

## 2014-06-09 DIAGNOSIS — M25511 Pain in right shoulder: Secondary | ICD-10-CM | POA: Diagnosis not present

## 2014-06-09 NOTE — Therapy (Signed)
[address] [place], [place], [address] Phone: [phone]   Fax:  [phone]  Physical Therapy Treatment  Patient Details  Name: [name] MRN: [id number] Date of Birth: May 05, 1970 Referring Provider:  Dene Gentry, MD  Encounter Date: 06/09/2014      PT End of Session - 06/09/14 1056    Visit Number 20   Date for PT Re-Evaluation 07/10/14   PT Start Time 1024   PT Stop Time 1117   PT Time Calculation (min) 53 min      Past Medical History  Diagnosis Date  . Bipolar disorder   . Eczema   . Pes planus   . Achilles tendinitis   . Hypertension   . Migraine   . Achilles tendinitis   . Ganglion cyst 09/29/2009    left wrist (2 cyst)  . Arthritis   . Allergy   . Lipoma   . ADHD (attention deficit hyperactivity disorder)   . Hyperprolactinemia   . Bipolar affective   . Personality disorder     Past Surgical History  Procedure Laterality Date  . Tumor resection left thigh    . Ankle surgery  12/88    left   . Lipoma removal    . Ganglion cyst excision  2011  . Chest nodule  1990?    rt chest wall nodule removal  . Shoulder surgery  01/13/2011    right, partial tear  . Right bunioectomy      There were no vitals filed for this visit.  Visit Diagnosis:  Right shoulder pain      Subjective Assessment - 06/09/14 1030    Subjective This is my 5th day in a row working, I am pretty sore int he right shoulder   Currently in Pain? Yes   Pain Score 6    Pain Location Shoulder   Pain Orientation Right   Pain Type Acute pain   Pain Onset 1 to 4 weeks ago   Pain Frequency Constant   Aggravating Factors  work   Pain Relieving Factors rest                         [facility] Adult PT Treatment/Exercise - 06/09/14 0001    Neck Exercises: Machines for Strengthening   UBE (Upper Arm Bike) level 7 x 6 minutes   Shoulder Exercises: Standing   Horizontal ABduction 20  reps;Theraband   Theraband Level (Shoulder Horizontal ABduction) Level 4 (Blue)   External Rotation 20 reps;Theraband   Theraband Level (Shoulder External Rotation) Level 4 (Blue)   Row 20 reps   Row Weight (lbs) 8   Other Standing Exercises rhythmic stabilization   Other Standing Exercises overhead heavy ball carry   Shoulder Exercises: ROM/Strengthening   Wall Pushups 20 reps   Rhythmic Stabilization, Seated standing iwth 6.6 # ball   Electrical Stimulation   Electrical Stimulation Location right rhomboid   Electrical Stimulation Parameters IFC   Electrical Stimulation Goals Pain   Manual Therapy   Manual Therapy Myofascial release   Myofascial Release right rhomboid into scapula                     PT Long Term Goals - 04/10/14 0845    PT LONG TERM GOAL #1   Title decrease pain 50%   Status Partially Met   PT LONG TERM GOAL #2   Title report 50% less  difficulty at work   Status Partially [place] - 2014-06-20 1149    Clinical Impression Statement Still very sore and tender int he upper trap, rhomboid and cervical parapsinals   PT Next Visit Plan work on trigger points   Consulted and Agree with Plan of Care Patient          G-Codes - 06-20-2014 1150    Functional Assessment Tool Used FOTO   Functional Limitation Other PT primary   Other PT Primary Current Status (Z6010) At least 20 percent but less than 40 percent impaired, limited or restricted   Other PT Primary Goal Status (X3235) At least 20 percent but less than 40 percent impaired, limited or restricted      Problem List Patient Active Problem List   Diagnosis Date Noted  . Right ankle sprain 06/01/2014  . Contusion, multiple sites 06/01/2014  . Strain of right gastrocnemius muscle 06/01/2014  . Phonophobia 05/04/2014  . Photophobia of both eyes 05/04/2014  . Emotionally unstable borderline personality disorder 05/04/2014  . Anxiety state 05/04/2014  . Nausea with  vomiting 05/04/2014  . Mixed bipolar I disorder   . Bipolar I disorder, most recent episode mixed 04/18/2014  . Bipolar affective disorder, depressed, severe 04/12/2014  . Suicidal ideation 04/12/2014  . Injury of right shoulder and upper arm 02/17/2014  . Left leg pain 06/03/2013  . Right hip pain 06/03/2013  . Migraine with status migrainosus 01/08/2013  . Personality disorder   . Chronic migraine 05/08/2012  . Contact dermatitis 11/27/2011  . Major depressive disorder, recurrent episode 10/27/2011  . Generalized anxiety disorder 10/27/2011  . 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 Borderline personality disorder 10/27/2011  . Right foot pain 09/28/2011  . 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 Ganglion cyst 09/29/2009  . PES PLANUS 07/01/2008  . BIPOLAR DISORDER UNSPECIFIED 06/09/2008    Sumner Boast, PT Jun 20, 2014, 11:51 AM  [address] [place] [place], [place], [address] Phone: [phone]   Fax:  [phone]

## 2014-06-10 ENCOUNTER — Other Ambulatory Visit (HOSPITAL_COMMUNITY): Payer: PPO

## 2014-06-10 ENCOUNTER — Ambulatory Visit: Payer: PPO | Admitting: Psychology

## 2014-06-11 ENCOUNTER — Encounter: Payer: Self-pay | Admitting: Physical Therapy

## 2014-06-11 ENCOUNTER — Ambulatory Visit: Payer: Self-pay | Admitting: Family Medicine

## 2014-06-11 ENCOUNTER — Ambulatory Visit: Payer: PPO | Admitting: Physical Therapy

## 2014-06-11 DIAGNOSIS — M25511 Pain in right shoulder: Secondary | ICD-10-CM | POA: Diagnosis not present

## 2014-06-11 MED ORDER — SUMATRIPTAN SUCCINATE 6.5 MG/4HR TD PTCH: 6.5000 mg | MEDICATED_PATCH | TRANSDERMAL | Status: DC | PRN

## 2014-06-11 NOTE — Telephone Encounter (Addendum)
I called the patient to relay providers note.  Got no answer.  Left message.  Preformatted Rx has been completed, faxed for signature.

## 2014-06-11 NOTE — Telephone Encounter (Signed)
i will be happy to do so. She has severe nausea and long lasting migraines. [employer] order in Epic, need to use preformatted prescription , too. This helps with coverage.

## 2014-06-11 NOTE — Therapy (Signed)
[place], [place], [address] Phone: [phone]   Fax:  [phone]  Physical Therapy Treatment  Patient Details  Name: [name] MRN: [id number] Date of Birth: [birth year] Referring Provider:  Dene Gentry, MD  Encounter Date: 06/11/2014      PT End of Session - 06/11/14 1518    Visit Number 21   Date for PT Re-Evaluation 07/10/14   PT Start Time 1441   PT Stop Time 1533   PT Time Calculation (min) 52 min      Past Medical History  Diagnosis Date  . Bipolar disorder   . Eczema   . Pes planus   . Achilles tendinitis   . Hypertension   . Migraine   . Achilles tendinitis   . Ganglion cyst 09/29/2009    left wrist (2 cyst)  . Arthritis   . Allergy   . Lipoma   . ADHD (attention deficit hyperactivity disorder)   . Hyperprolactinemia   . Bipolar affective   . Personality disorder     Past Surgical History  Procedure Laterality Date  . Tumor resection left thigh    . Ankle surgery  12/88    left   . Lipoma removal    . Ganglion cyst excision  2011  . Chest nodule  1990?    rt chest wall nodule removal  . Shoulder surgery  01/13/2011    right, partial tear  . Right bunioectomy      There were no vitals filed for this visit.  Visit Diagnosis:  Right shoulder pain      Subjective Assessment - 06/11/14 1442    Subjective Tonight will be 7 days in a row at work, it is taking a toll, a little more sore.   Currently in Pain? Yes                         [facility] Adult PT Treatment/Exercise - 06/11/14 0001    Neck Exercises: Machines for Strengthening   UBE (Upper Arm Bike) level 7 x 6 minutes   Cybex Row 35# 2x15   Cybex Chest Press 10# 2x15   Power Tower lat pulls 35# 2x15   Other Machines for Strengthening 5# scap stabilization 3 ways   Shoulder Exercises: Seated   Other Seated Exercises 7# bent over row, 4# bent over extension   Shoulder  Exercises: Standing   Horizontal ABduction 20 reps;Theraband   Theraband Level (Shoulder Horizontal ABduction) Level 4 (Blue)   External Rotation 20 reps;Theraband   Theraband Level (Shoulder External Rotation) Level 4 (Blue)   Other Standing Exercises rhythmic stabilization   Other Standing Exercises overhead heavy ball carry   Electrical Stimulation   Electrical Stimulation Location right rhomboid   Electrical Stimulation Parameters IFC   Electrical Stimulation Goals Pain   Manual Therapy   Manual Therapy Myofascial release   Myofascial Release right rhomboid into scapula                     PT Long Term Goals - 04/10/14 0845    PT LONG TERM GOAL #1   Title decrease pain 50%   Status Partially Met   PT LONG TERM GOAL #2   Title report 50% less difficulty at work   Status Partially Met               Plan - 06/11/14 1519  Clinical Impression Statement large and tender knot in the right upper trap and in the right rhomboid   PT Next Visit Plan work on trigger points   Consulted and Agree with Plan of Care Patient        Problem List Patient Active Problem List   Diagnosis Date Noted  . Right ankle sprain 06/01/2014  . Contusion, multiple sites 06/01/2014  . Strain of right gastrocnemius muscle 06/01/2014  . Phonophobia 05/04/2014  . Photophobia of both eyes 05/04/2014  . Emotionally unstable borderline personality disorder 05/04/2014  . Anxiety state 05/04/2014  . Nausea with vomiting 05/04/2014  . Mixed bipolar I disorder   . Bipolar I disorder, most recent episode mixed 04/18/2014  . Bipolar affective disorder, depressed, severe 04/12/2014  . Suicidal ideation 04/12/2014  . Injury of right shoulder and upper arm 02/17/2014  . Left leg pain 06/03/2013  . Right hip pain 06/03/2013  . Migraine with status migrainosus 01/08/2013  . Personality disorder   . Chronic migraine 05/08/2012  . Contact dermatitis 11/27/2011  . Major depressive  disorder, recurrent episode 10/27/2011  . Generalized anxiety disorder 10/27/2011  . 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 Borderline personality disorder 10/27/2011  . Right foot pain 09/28/2011  . 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 Ganglion cyst 09/29/2009  . PES PLANUS 07/01/2008  . BIPOLAR DISORDER UNSPECIFIED 06/09/2008    Sumner Boast., PT 06/11/2014, 3:20 PM  [address] [place], [place], [address] Phone: [phone]   Fax:  [phone]

## 2014-06-12 NOTE — Telephone Encounter (Signed)
Signature obtained, all info forwarded to [employer] at [phone] to enroll patient.  Once enrolled, they will complete benefit investigation and contact patient regarding coverage, cost and shipping info.

## 2014-06-16 ENCOUNTER — Encounter: Payer: Self-pay | Admitting: Physical Therapy

## 2014-06-16 ENCOUNTER — Ambulatory Visit: Payer: PPO | Admitting: Physical Therapy

## 2014-06-16 DIAGNOSIS — M25511 Pain in right shoulder: Secondary | ICD-10-CM

## 2014-06-16 NOTE — Therapy (Signed)
[place] [address], [place], [address] Phone: [phone]   Fax:  [phone]  Physical Therapy Treatment  Patient Details  Name: [name] MRN: [id number] Date of Birth: [birth year] Referring Provider:  Dene Gentry, MD  Encounter Date: 06/16/2014      PT End of Session - 06/16/14 1009    Visit Number 22   Date for PT Re-Evaluation 07/10/14   PT Start Time 0933   PT Stop Time 1028   PT Time Calculation (min) 55 min      Past Medical History  Diagnosis Date  . Bipolar disorder   . Eczema   . Pes planus   . Achilles tendinitis   . Hypertension   . Migraine   . Achilles tendinitis   . Ganglion cyst 09/29/2009    left wrist (2 cyst)  . Arthritis   . Allergy   . Lipoma   . ADHD (attention deficit hyperactivity disorder)   . Hyperprolactinemia   . Bipolar affective   . Personality disorder     Past Surgical History  Procedure Laterality Date  . Tumor resection left thigh    . Ankle surgery  12/88    left   . Lipoma removal    . Ganglion cyst excision  2011  . Chest nodule  1990?    rt chest wall nodule removal  . Shoulder surgery  01/13/2011    right, partial tear  . Right bunioectomy      There were no vitals filed for this visit.  Visit Diagnosis:  Right shoulder pain      Subjective Assessment - 06/16/14 0934    Subjective Overall doing better.  Just sore and tight.  Work really fatigues me   Currently in Pain? Yes   Pain Score 7    Pain Location Shoulder   Pain Orientation Right   Pain Descriptors / Indicators Aching                         [facility] Adult PT Treatment/Exercise - 06/16/14 0001    Neck Exercises: Machines for Strengthening   UBE (Upper Arm Bike) level 7 x 6 minutes   Cybex Row 35# 2x15   Cybex Chest Press 10# 2x15   Power Tower lat pulls 35# 2x15   Other Machines for Strengthening 5# scap stabilization 3 ways   Other Machines  for Strengthening 10# obliques on pulleys   Shoulder Exercises: Standing   Other Standing Exercises rhythmic stabilization   Other Standing Exercises overhead heavy ball carry   Shoulder Exercises: ROM/Strengthening   "W" Arms 3#   X to V Arms 3#   Electrical Stimulation   Electrical Stimulation Location right rhomboid   Electrical Stimulation Parameters IFC   Electrical Stimulation Goals Pain   Manual Therapy   Manual Therapy Myofascial release   Myofascial Release right rhomboid into scapula                     PT Long Term Goals - 04/10/14 0845    PT LONG TERM GOAL #1   Title decrease pain 50%   Status Partially Met   PT LONG TERM GOAL #2   Title report 50% less difficulty at work   Status Partially Met               Plan - 06/16/14 1010    Clinical Impression Statement Knots continue  to be tender and tight, strength and ROM is good   PT Next Visit Plan work on trigger points   Consulted and Agree with Plan of Care Patient        Problem List Patient Active Problem List   Diagnosis Date Noted  . Right ankle sprain 06/01/2014  . Contusion, multiple sites 06/01/2014  . Strain of right gastrocnemius muscle 06/01/2014  . Phonophobia 05/04/2014  . Photophobia of both eyes 05/04/2014  . Emotionally unstable borderline personality disorder 05/04/2014  . Anxiety state 05/04/2014  . Nausea with vomiting 05/04/2014  . Mixed bipolar I disorder   . Bipolar I disorder, most recent episode mixed 04/18/2014  . Bipolar affective disorder, depressed, severe 04/12/2014  . Suicidal ideation 04/12/2014  . Injury of right shoulder and upper arm 02/17/2014  . Left leg pain 06/03/2013  . Right hip pain 06/03/2013  . Migraine with status migrainosus 01/08/2013  . Personality disorder   . Chronic migraine 05/08/2012  . Contact dermatitis 11/27/2011  . Major depressive disorder, recurrent episode 10/27/2011  . Generalized anxiety disorder 10/27/2011  . ADHD  (attention deficit hyperactivity disorder), 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  . Borderline personality disorder 10/27/2011  . Right foot pain 09/28/2011  . 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 Ganglion cyst 09/29/2009  . PES PLANUS 07/01/2008  . BIPOLAR DISORDER UNSPECIFIED 06/09/2008    Sumner Boast., PT 06/16/2014, 10:11 AM  [address] [place], [place], [address] Phone: [phone]   Fax:  [phone]

## 2014-06-17 ENCOUNTER — Ambulatory Visit: Payer: PPO | Admitting: Psychology

## 2014-06-18 ENCOUNTER — Telehealth: Payer: Self-pay

## 2014-06-18 NOTE — Telephone Encounter (Signed)
Called patient and spoke to her Dr. Brett Fairy will give her a booklet and $ 0  CO  - pay for a limited time only patient will pick up at the front desk.

## 2014-06-19 DIAGNOSIS — Z0289 Encounter for other administrative examinations: Secondary | ICD-10-CM

## 2014-06-24 ENCOUNTER — Ambulatory Visit: Payer: PPO | Admitting: Psychology

## 2014-07-01 ENCOUNTER — Ambulatory Visit: Payer: PPO | Admitting: Psychology

## 2014-07-27 ENCOUNTER — Other Ambulatory Visit: Payer: Self-pay | Admitting: Neurology

## 2014-08-18 ENCOUNTER — Ambulatory Visit (INDEPENDENT_AMBULATORY_CARE_PROVIDER_SITE_OTHER): Payer: PPO | Admitting: Family Medicine

## 2014-08-18 ENCOUNTER — Encounter: Payer: Self-pay | Admitting: Family Medicine

## 2014-08-18 ENCOUNTER — Ambulatory Visit (HOSPITAL_BASED_OUTPATIENT_CLINIC_OR_DEPARTMENT_OTHER)
Admission: RE | Admit: 2014-08-18 | Discharge: 2014-08-18 | Disposition: A | Discharge status: Home / Self Care | Payer: PPO | Source: Ambulatory Visit | Attending: Family Medicine | Admitting: Family Medicine

## 2014-08-18 VITALS — BP 115/84 | HR 60 | Ht 70.0 in | Wt 242.0 lb

## 2014-08-18 DIAGNOSIS — S6992XA Unspecified injury of left wrist, hand and finger(s), initial encounter: Secondary | ICD-10-CM

## 2014-08-18 DIAGNOSIS — R202 Paresthesia of skin: Secondary | ICD-10-CM | POA: Insufficient documentation

## 2014-08-20 DIAGNOSIS — S6992XA Unspecified injury of left wrist, hand and finger(s), initial encounter: Secondary | ICD-10-CM | POA: Insufficient documentation

## 2014-08-20 NOTE — Progress Notes (Signed)
PCP: [name], MD  Subjective:   HPI: Patient is a 44 y.o. female here for left finger injury.  Patient reports about a week ago she accidentally hit radial aspect of left index finger on a table corner. Some swelling, bruising. Pain up to 7/10. Right handed. Difficulty bending due to pain. No prior injuries.  Past Medical History  Diagnosis Date  . Bipolar disorder   . Eczema   . Pes planus   . Achilles tendinitis   . Hypertension   . Migraine   . Achilles tendinitis   . Ganglion cyst 09/29/2009    left wrist (2 cyst)  . Arthritis   . Allergy   . Lipoma   . ADHD (attention deficit hyperactivity disorder)   . Hyperprolactinemia   . Bipolar affective   . Personality disorder     Current Outpatient Prescriptions on File Prior to Visit  Medication Sig Dispense Refill  . clobetasol cream (TEMOVATE) 1.24 % Apply 1 application topically 2 (two) times daily as needed (skin redness).     . Cyanocobalamin (VITAMIN B-12 PO) Take 1 tablet by mouth daily.    . diclofenac (VOLTAREN) 75 MG EC tablet Take 1 tablet (75 mg total) by mouth 2 (two) times daily. 60 tablet 1  . divalproex (DEPAKOTE ER) 500 MG 24 hr tablet Take 3 tablets (1,500 mg total) by mouth at bedtime. For mood stabilization. 90 tablet 0  . hydrochlorothiazide (HYDRODIURIL) 25 MG tablet Take 1 tablet (25 mg total) by mouth daily. For blood pressure control. 30 tablet 0  . lamoTRIgine (LAMICTAL) 200 MG tablet Take 1 tablet (200 mg total) by mouth at bedtime. 30 tablet 0  . LORazepam (ATIVAN) 0.5 MG tablet   0  . lurasidone (LATUDA) 80 MG TABS tablet Take 1 tablet (80 mg total) by mouth daily at 6 PM. 30 tablet 0  . methocarbamol (ROBAXIN) 500 MG tablet Take 1 tablet (500 mg total) by mouth every 6 (six) hours as needed for muscle spasms. 60 tablet 1  . metoprolol succinate (TOPROL-XL) 100 MG 24 hr tablet Take 1 tablet (100 mg total) by mouth daily. Take with or immediately following a meal for blood pressure  control. 30 tablet 0  . Multiple Vitamin (MULTIVITAMIN WITH MINERALS) TABS tablet Take 1 tablet by mouth daily. For nutritional supplementation. 30 tablet 0  . promethazine (PHENERGAN) 25 MG tablet take 1 tablet by mouth every 6 hours if needed for nausea and vomiting 30 tablet 6  . sertraline (ZOLOFT) 50 MG tablet Take 1 tablet (50 mg total) by mouth daily. 30 tablet 0  . sodium chloride 0.9 % SOLN 100 mL with valproate 500 MG/5ML SOLN To be infused over  a 15 minutes period, repeat once if partial relief. 500 mg 1  . SUMAtriptan Succinate (ZECUITY) 6.5 MG/4HR PTCH Place 6.5 mg onto the skin as needed. 4 patch 3  . zolpidem (AMBIEN) 10 MG tablet Take 10 mg by mouth at bedtime.  0   No current facility-administered medications on file prior to visit.    Past Surgical History  Procedure Laterality Date  . Tumor resection left thigh    . Ankle surgery  12/88    left   . Lipoma removal    . Ganglion cyst excision  2011  . Chest nodule  1990?    rt chest wall nodule removal  . Shoulder surgery  01/13/2011    right, partial tear  . Right bunioectomy      Allergies  Allergen  Reactions  . Adhesive [Tape] Itching and Rash    Also reacted to Steri Strips and Band-Aids.  . Dilaudid [Hydromorphone Hcl] Itching  . Morphine Nausea And Vomiting  . Penicillins Hives  . Percocet [Oxycodone-Acetaminophen] Itching  . Prednisone Hives  . Provera [Medroxyprogesterone Acetate] Other (See Comments)    Causes manic episodes  . Ultram [Tramadol Hcl] Itching    History   Social History  . Marital Status: Single    Spouse Name: N/A  . Number of Children: 0  . Years of Education: N/A   Occupational History  .  [employer]   Social History Main Topics  . Smoking status: Never Smoker   . Smokeless tobacco: Never Used  . Alcohol Use: No  . Drug Use: No  . Sexual Activity: No   Other Topics Concern  . Not on file   Social History Narrative   Caffeine  2 sodas daily, 1 cup coffee daily.     Family History  Problem Relation Age of Onset  . Hypertension Mother   . Hyperlipidemia Mother   . Heart attack Father   . Heart disease Father   . Hypertension Father   . Bipolar disorder Father   . Diabetes Paternal Grandfather   . Heart disease Maternal Aunt   . Breast cancer Maternal Aunt   . Heart disease Maternal Grandmother   . Cancer Maternal Grandmother     colon    BP 115/84 mmHg  Pulse 60  Ht 5\' 10"  (1.778 m)  Wt 242 lb (109.77 kg)  BMI 34.72 kg/m2  LMP 08/17/2014  Review of Systems: See HPI above.    Objective:  Physical Exam:  Gen: NAD  Left hand: No gross deformity, malrotation, angulation.  Mild swelling of 2nd digit. TTP radial aspect of 2nd PIP joint and within middle, proximal phalanges. Collateral ligaments intact. FROM at MCP, PIP, DIP joints - able to resist flexion and extension at all joints.  NVI distally.    Assessment & Plan:  1. Left 2nd digit injury - radiographs negative, reassuring.  Collateral ligaments and tendons also intact on exam.  2/2 contusion, grade 1 sprain also possible.  Reassured patient.  Buddy taping, icing as needed.  F/u in 1 month if not improving.

## 2014-08-20 NOTE — Assessment & Plan Note (Signed)
radiographs negative, reassuring.  Collateral ligaments and tendons also intact on exam.  2/2 contusion, grade 1 sprain also possible.  Reassured patient.  Buddy taping, icing as needed.  F/u in 1 month if not improving.

## 2014-09-21 NOTE — Patient Outreach (Signed)
[facility]) Care Management  09/21/2014  Rozell Kettlewell Magoon 10-Feb-1970 [id number]   Referral from HTA tier 4 list, assigned to Mariann Laster, [facility] for patient outreach.  Stephano Arrants L. Domique Reardon, [facility] Care Management Assistant

## 2014-09-23 ENCOUNTER — Inpatient Hospital Stay (HOSPITAL_COMMUNITY)
Admission: AD | Admit: 2014-09-23 | Discharge: 2014-09-28 | DRG: 885 | Disposition: A | Discharge status: Home / Self Care | Payer: PPO | Attending: Psychiatry | Admitting: Psychiatry

## 2014-09-23 ENCOUNTER — Encounter (HOSPITAL_COMMUNITY): Payer: Self-pay | Admitting: *Deleted

## 2014-09-23 DIAGNOSIS — F333 Major depressive disorder, recurrent, severe with psychotic symptoms: Secondary | ICD-10-CM | POA: Diagnosis present

## 2014-09-23 DIAGNOSIS — F319 Bipolar disorder, unspecified: Secondary | ICD-10-CM | POA: Diagnosis present

## 2014-09-23 DIAGNOSIS — F3130 Bipolar disorder, current episode depressed, mild or moderate severity, unspecified: Secondary | ICD-10-CM | POA: Insufficient documentation

## 2014-09-23 DIAGNOSIS — R45851 Suicidal ideations: Secondary | ICD-10-CM | POA: Diagnosis present

## 2014-09-23 DIAGNOSIS — G4700 Insomnia, unspecified: Secondary | ICD-10-CM | POA: Diagnosis present

## 2014-09-23 MED ORDER — SERTRALINE HCL 50 MG PO TABS
50.0000 mg | ORAL_TABLET | Freq: Every day | ORAL | Status: DC
  Filled 2014-09-23 (×3): qty 1

## 2014-09-23 MED ORDER — ACETAMINOPHEN 325 MG PO TABS
650.0000 mg | ORAL_TABLET | Freq: Four times a day (QID) | ORAL | Status: DC | PRN
  Administered 2014-09-24 – 2014-09-26 (×3): 650 mg via ORAL
  Filled 2014-09-23 (×3): qty 2

## 2014-09-23 MED ORDER — MAGNESIUM HYDROXIDE 400 MG/5ML PO SUSP: 30.0000 mL | Freq: Every day | ORAL | Status: DC | PRN

## 2014-09-23 MED ORDER — METOPROLOL SUCCINATE ER 100 MG PO TB24
100.0000 mg | ORAL_TABLET | Freq: Every day | ORAL | Status: DC
  Administered 2014-09-24 – 2014-09-28 (×5): 100 mg via ORAL
  Filled 2014-09-23 (×8): qty 1

## 2014-09-23 MED ORDER — HYDROXYZINE HCL 25 MG PO TABS: 25.0000 mg | ORAL_TABLET | Freq: Every evening | ORAL | Status: DC | PRN

## 2014-09-23 MED ORDER — ZOLPIDEM TARTRATE 5 MG PO TABS
5.0000 mg | ORAL_TABLET | Freq: Every evening | ORAL | Status: DC | PRN
  Administered 2014-09-23 – 2014-09-25 (×2): 5 mg via ORAL
  Filled 2014-09-23 (×2): qty 1

## 2014-09-23 MED ORDER — LURASIDONE HCL 80 MG PO TABS
80.0000 mg | ORAL_TABLET | Freq: Every day | ORAL | Status: DC
  Administered 2014-09-23 – 2014-09-27 (×5): 80 mg via ORAL
  Filled 2014-09-23 (×8): qty 1

## 2014-09-23 MED ORDER — BACITRACIN-NEOMYCIN-POLYMYXIN 400-5-5000 EX OINT
TOPICAL_OINTMENT | CUTANEOUS | Status: AC
  Administered 2014-09-23: 1 via TOPICAL
  Filled 2014-09-23: qty 1

## 2014-09-23 MED ORDER — ALUM & MAG HYDROXIDE-SIMETH 200-200-20 MG/5ML PO SUSP: 30.0000 mL | ORAL | Status: DC | PRN

## 2014-09-23 MED ORDER — DIVALPROEX SODIUM ER 500 MG PO TB24
1500.0000 mg | ORAL_TABLET | Freq: Every day | ORAL | Status: DC
  Administered 2014-09-23 – 2014-09-27 (×5): 1500 mg via ORAL
  Filled 2014-09-23 (×8): qty 3

## 2014-09-23 MED ORDER — ADULT MULTIVITAMIN W/MINERALS CH
1.0000 | ORAL_TABLET | Freq: Every day | ORAL | Status: DC
  Administered 2014-09-24 – 2014-09-28 (×5): 1 via ORAL
  Filled 2014-09-23 (×10): qty 1

## 2014-09-23 MED ORDER — BACITRACIN-NEOMYCIN-POLYMYXIN OINTMENT TUBE
TOPICAL_OINTMENT | Freq: Three times a day (TID) | CUTANEOUS | Status: DC
  Administered 2014-09-23: 1 via TOPICAL
  Administered 2014-09-25 (×2): via TOPICAL
  Administered 2014-09-26 (×3): 1 via TOPICAL
  Administered 2014-09-27 – 2014-09-28 (×5): via TOPICAL
  Filled 2014-09-23 (×2): qty 1
  Filled 2014-09-23: qty 15

## 2014-09-23 MED ORDER — LAMOTRIGINE 200 MG PO TABS
200.0000 mg | ORAL_TABLET | Freq: Every day | ORAL | Status: DC
  Administered 2014-09-23 – 2014-09-27 (×5): 200 mg via ORAL
  Filled 2014-09-23 (×5): qty 1
  Filled 2014-09-23: qty 2
  Filled 2014-09-23 (×2): qty 1

## 2014-09-23 MED ORDER — HYDROCHLOROTHIAZIDE 25 MG PO TABS
25.0000 mg | ORAL_TABLET | Freq: Every day | ORAL | Status: DC
  Administered 2014-09-24 – 2014-09-28 (×5): 25 mg via ORAL
  Filled 2014-09-23 (×10): qty 1
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
## 2014-09-23 NOTE — Progress Notes (Signed)
Patient oriented to 400 hall. Completed admission assessment deferred until 1500.  Contracts for safety and 15' checks initiated.  Informed MHT staff of admission.

## 2014-09-23 NOTE — Tx Team (Signed)
Initial Interdisciplinary Treatment Plan   PATIENT STRESSORS: Financial difficulties Health problems Medication change or noncompliance   PATIENT STRENGTHS: Average or above average intelligence Communication skills Supportive family/friends Work skills   PROBLEM LIST: Problem List/Patient Goals Date to be addressed Date deferred Reason deferred Estimated date of resolution  Depression 09/23/2014     Self-harm behaviors (cutting) 09/23/2014     "I feel so stupid coming back; I know I need to be here." 09/23/2014     Anxiety 09/23/2014     Suicidal Ideation 09/23/2014                              DISCHARGE CRITERIA:  Improved stabilization in mood, thinking, and/or behavior Motivation to continue treatment in a less acute level of care Reduction of life-threatening or endangering symptoms to within safe limits Verbal commitment to aftercare and medication compliance  PRELIMINARY DISCHARGE PLAN: Outpatient therapy Return to previous living arrangement Return to previous work or school arrangements  PATIENT/FAMIILY INVOLVEMENT: This treatment plan has been presented to and reviewed with the patient, [name].  The patient and family have been given the opportunity to ask questions and make suggestions.  [name] 09/23/2014, 5:05 PM

## 2014-09-23 NOTE — Progress Notes (Signed)
[place] Group Notes:  (Nursing/MHT/Case Management/Adjunct)  Date:  09/23/2014  Time:  9:23 PM  Type of Therapy:  Psychoeducational Skills  Participation Level:  Active  Participation Quality:  Appropriate and Attentive  Affect:  Appropriate  Cognitive:  Appropriate  Insight:  Appropriate  Engagement in Group:  Engaged  Modes of Intervention:  Activity  Summary of Progress/Problems: Pts played a therapeutic activity of Mental Health Jeopardy. Pt was actively engaged and participated appropriately.  [name] 09/23/2014, 9:23 PM

## 2014-09-24 DIAGNOSIS — F3130 Bipolar disorder, current episode depressed, mild or moderate severity, unspecified: Secondary | ICD-10-CM

## 2014-09-24 DIAGNOSIS — R45851 Suicidal ideations: Secondary | ICD-10-CM

## 2014-09-24 LAB — CBC WITH DIFFERENTIAL/PLATELET
Basophils Absolute: 0 10*3/uL (ref 0.0–0.1)
Basophils Relative: 1 % (ref 0–1)
Eosinophils Absolute: 0.2 10*3/uL (ref 0.0–0.7)
Eosinophils Relative: 2 % (ref 0–5)
HCT: 42.1 % (ref 36.0–46.0)
Hemoglobin: 13.9 g/dL (ref 12.0–15.0)
Lymphocytes Relative: 38 % (ref 12–46)
Lymphs Abs: 3 10*3/uL (ref 0.7–4.0)
MCH: 30.7 pg (ref 26.0–34.0)
MCHC: 33 g/dL (ref 30.0–36.0)
MCV: 92.9 fL (ref 78.0–100.0)
Monocytes Absolute: 0.8 10*3/uL (ref 0.1–1.0)
Monocytes Relative: 11 % (ref 3–12)
Neutro Abs: 3.8 10*3/uL (ref 1.7–7.7)
Neutrophils Relative %: 48 % (ref 43–77)
Platelets: 235 10*3/uL (ref 150–400)
RBC: 4.53 MIL/uL (ref 3.87–5.11)
RDW: 12.7 % (ref 11.5–15.5)
WBC: 7.9 10*3/uL (ref 4.0–10.5)

## 2014-09-24 MED ORDER — ONDANSETRON 8 MG PO TBDP
8.0000 mg | ORAL_TABLET | Freq: Once | ORAL | Status: AC
  Administered 2014-09-24: 8 mg via ORAL

## 2014-09-24 MED ORDER — ONDANSETRON 4 MG PO TBDP
ORAL_TABLET | ORAL | Status: AC
  Filled 2014-09-24: qty 2

## 2014-09-24 MED ORDER — MIRTAZAPINE 15 MG PO TABS
15.0000 mg | ORAL_TABLET | Freq: Every day | ORAL | Status: DC
Start: 2014-09-24 — End: 2014-09-28
  Administered 2014-09-24 – 2014-09-27 (×4): 15 mg via ORAL
  Filled 2014-09-24 (×6): qty 1

## 2014-09-24 MED ORDER — SUMATRIPTAN SUCCINATE 6 MG/0.5ML ~~LOC~~ SOLN
6.0000 mg | SUBCUTANEOUS | Status: DC | PRN
  Administered 2014-09-24: 6 mg via SUBCUTANEOUS
  Filled 2014-09-24: qty 0.5

## 2014-09-24 MED ORDER — ONDANSETRON 4 MG PO TBDP: 4.0000 mg | ORAL_TABLET | Freq: Three times a day (TID) | ORAL | Status: DC | PRN

## 2014-09-24 MED ORDER — HYDROXYZINE HCL 50 MG PO TABS
50.0000 mg | ORAL_TABLET | Freq: Two times a day (BID) | ORAL | Status: DC | PRN
Start: 2014-09-24 — End: 2014-09-28
  Administered 2014-09-24 – 2014-09-27 (×6): 50 mg via ORAL
  Filled 2014-09-24 (×6): qty 1

## 2014-09-24 NOTE — BHH Group Notes (Signed)
[place] Group Notes:  (Nursing/MHT/Case Management/Adjunct)  Date:  09/24/2014  Time:  9:54 AM  Type of Therapy:  Nurse Education  Participation Level:  Minimal  Participation Quality:  Appropriate  Affect:  Flat and Irritable  Cognitive:  Alert  Insight:  Lacking  Engagement in Group:  Limited  Modes of Intervention:   .  Summary of Progress/Problems:  Pt. Attended group with minimal participation.  She was alert but irritable  Oretha Milch 09/24/2014, 9:54 AM

## 2014-09-24 NOTE — Progress Notes (Signed)
D: Patient in the hallway on approach.  Patient states her day got better throughout the day.  Patient states she is happy and thinks her medications are finally correct.  Patient states her mother visited today and she states they had a good visit.  Patient does appear to be attention seeking.  Patient denies SI/HI and denies AVH.   A: Staff to monitor Q 15 mins for safety.  Encouragement and support offered.  Scheduled medications administered per orders.  Vistaril administered prn for anxiety and sleep.   R: Patient remains safe on the unit.  Patient attended group tonight.  Patient visible on the unit and interacting with peers.  Patient taking administered medications.

## 2014-09-24 NOTE — BHH Suicide Risk Assessment (Signed)
[facility] Admission Suicide Risk Assessment   Nursing information obtained from:   patient Demographic factors:   44 year old singe female, lives  With mother, employed  Current Mental Status:   see below  Loss Factors:   stress at work Historical Factors:   history of bipolar disorder  Risk Reduction Factors:   Resilience  Total Time spent with patient: 45 minutes Principal Problem:  Bipolar Disorder , Depressed  Diagnosis:   Patient Active Problem List   Diagnosis Date Noted  . Bipolar I disorder, most recent episode depressed [F31.30]   . MDD (major depressive disorder), recurrent, severe, with psychosis [F33.3] 09/23/2014  . Injury of left index finger [S69.92XA] 08/20/2014  . Right ankle sprain [S93.401A] 06/01/2014  . Contusion, multiple sites [T14.8] 06/01/2014  . Strain of right gastrocnemius muscle [S86.811A] 06/01/2014  . Phonophobia [F40.298] 05/04/2014  . Photophobia of both eyes [H53.19] 05/04/2014  . 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 Anxiety state [F41.1] 05/04/2014  . Nausea with vomiting [R11.2] 05/04/2014  . Mixed bipolar I disorder [F31.60]   . Bipolar I disorder, most recent episode mixed [F31.60] 04/18/2014  . Bipolar affective disorder, depressed, severe [F31.4] 04/12/2014  . Suicidal ideation [R45.851] 04/12/2014  . Injury of right shoulder and upper arm [S49.91XA] 02/17/2014  . Left leg pain [M79.605] 06/03/2013  . Right hip pain [M25.551] 06/03/2013  . Migraine with status migrainosus [G43.901] 01/08/2013  . Personality disorder [F60.9]   . Chronic migraine [G43.709] 05/08/2012  . Contact dermatitis [L25.9] 11/27/2011  . Major depressive disorder, recurrent episode [F33.9] 10/27/2011  . Generalized anxiety disorder [F41.1] 10/27/2011  . 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 Borderline personality disorder [F60.3] 10/27/2011  . Right foot pain [M79.671] 09/28/2011  .  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 Ganglion cyst [M67.40] 09/29/2009  . 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 BIPOLAR DISORDER UNSPECIFIED [F31.9] 06/09/2008     Continued Clinical Symptoms:  Alcohol Use Disorder Identification Test Final Score (AUDIT): 0 The "Alcohol Use Disorders Identification Test", Guidelines for Use in Primary Care, Second Edition.  World Pharmacologist [facility]). Score between 0-7:  no or low risk or alcohol related problems. Score between 8-15:  moderate risk of alcohol related problems. Score between 16-19:  high risk of alcohol related problems. Score 20 or above:  warrants further diagnostic evaluation for alcohol dependence and treatment.   CLINICAL FACTORS:  44 year old female , known to our service from prior admissions, most recently in March 2016. History of Bipolar Disorder, Borderline Personality Disorder. Presents with worsening depression, feeling subjectively overwhelmed, and recent onset of suicidal ideations. Also, recently engaged in self cutting , after years of no self cutting behaviors . Stressors include being told she is doing a great job at her work and that she may be promoted, which has made her anxious and ruminative that she will not do well with a promotion and disappoint self and employers .     Musculoskeletal: Strength & Muscle Tone: within normal limits Gait & Station: normal Patient leans: N/A  Psychiatric Specialty Exam: Physical Exam  ROS  Blood pressure 133/83, pulse 79, temperature 98.1 F (36.7 C), temperature source Oral, resp. rate 18, height 5\' 9"  (1.753 m), weight 241 lb (109.317 kg), SpO2 98 %.Body mass index is 35.57 kg/(m^2).   See admit note MSE  COGNITIVE FEATURES THAT CONTRIBUTE TO RISK:  Closed-mindedness and Loss of executive function    SUICIDE RISK:    Moderate:  Frequent suicidal ideation with limited intensity, and duration, some specificity in terms of plans, no associated intent, good self-control, limited dysphoria/symptomatology, some risk factors present, and identifiable protective factors, including available and accessible social support.  PLAN OF CARE: Patient will be admitted to inpatient psychiatric unit for stabilization and safety. Will provide and encourage milieu participation. Provide medication management and maked adjustments as needed.  Will follow daily.    Medical Decision Making:  Review of Psycho-Social Stressors (1), Review or order clinical lab tests (1), Established Problem, Worsening (2) and Review of Medication Regimen & Side Effects (2)  I certify that inpatient services furnished can reasonably be expected to improve the patient's condition.   [name] 09/24/2014, 1:48 PM
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
## 2014-09-24 NOTE — Progress Notes (Signed)
Patient ID: [name], female   DOB: Mar 26, 1970, 44 y.o.   MRN: [id number]   D ---   Pr. Denies pain at this time.   She agrees to contract for safety .  Pt. Attended group this AM with minimal participation.  Pt. Complains that she is not being given the medications that she feels she needs.  Pt. agred to talk this over with her Dr.   She is irritable and labile, requesting to change nurse.  This is her on going , daily behavior.  Staff will work together to make arraignments to keep pt. As happy as possible.  She maintains an angry, irritable affect with avertive eye contact.  --- A ----  Support, safety cks and medications provided.  --- R --  Pt. Remains safe but labile on unit

## 2014-09-24 NOTE — Progress Notes (Signed)
[facility] Group Notes:  (Nursing/MHT/Case Management/Adjunct)  Date:  09/24/2014  Time:  9:24 PM  Type of Therapy:  Psychoeducational Skills  Participation Level:  Did not attend. Pt was invited to group, however stayed in her room instead.  Modes of Intervention:  Activity  Summary of Progress/Problems: Pts played a game of Wellness Jeopardy for Wrap Up group.   Clint Bolder 09/24/2014, 9:24 PM

## 2014-09-24 NOTE — Progress Notes (Signed)
Pt who could be needy is alert and oriented x4. Pt who is facing many stressors verbalizes severe depression with so mild anxiety; she states, "I don't make enough money, only work 2 days a week; I am worried about my Lawyer, I got a Recruitment consultant for this year but I have to renew it very soon; I can't pay my credit card depth and I need an apartment." Pt complain of mild L. thigh pain of 3 on a 0-10 pain scale from self-inflicted cuts. Pt also complains of helplessness and hopelessness stating she has no good support system.  Pt however denies SI/HI/AVH. Pt continues to be cooperative and appropriate.  A: Medications administered as prescribed. Support, encouragement, and safe environment provided.   R: Pt was med compliant.  15-minute safety check continues.

## 2014-09-24 NOTE — Tx Team (Signed)
Interdisciplinary Treatment Plan Update (Adult) Date: 09/24/2014    Time Reviewed: 9:30 AM  Progress in Treatment: Attending groups: Continuing to assess, patient new to milieu Participating in groups: Continuing to assess, patient new to milieu Taking medication as prescribed: Yes Tolerating medication: Yes Family/Significant other contact made: No, CSW assessing for appropriate contacts Patient understands diagnosis: Yes Discussing patient identified problems/goals with staff: Yes Medical problems stabilized or resolved: Yes Denies suicidal/homicidal ideation: Yes Issues/concerns per patient self-inventory: Yes Other:  New problem(s) identified: N/A  Discharge Plan or Barriers: 09/24/2014:  CSW continuing to assess, patient new to milieu.  Reason for Continuation of Hospitalization:  Depression Anxiety Medication Stabilization   Comments: N/A  Estimated length of stay: 3-5 days   [name] is a 44 years old Caucasian female admitted with Bipolar Disorder, depression, SI, and cutting. Patient last [facility] hospital admission in 05/2014 for similar complaints. She plans to return home with her mother to follow up with her current outpatient providers. She will benefit from crisis stabilization, evaluation for medication, psycho-education groups for coping skills development, group therapy and case management for discharge planning.    Review of initial/current patient goals per problem list:  1. Goal(s): Patient will participate in aftercare plan   Met: Yes   Target date: 3-5 days post admission date   As evidenced by: Patient will participate within aftercare plan AEB aftercare provider and housing plan at discharge being identified.  09/24/2014: Goal met: Patient plans to return home to follow up with outpatient services.   2. Goal (s): Patient will exhibit decreased depressive symptoms and suicidal ideations.   Met: No   Target date: 3-5 days post admission  date   As evidenced by: Patient will utilize self rating of depression at 3 or below and demonstrate decreased signs of depression or be deemed stable for discharge by MD.  09/24/2014: Goal not met: Pt presents with flat affect and depressed mood.  Pt admitted with depression rating of 10.  Pt to show decreased sign of depression and a rating of 3 or less before d/c.       3. Goal(s): Patient will demonstrate decreased signs and symptoms of anxiety.   Met: No   Target date: 3-5 days post admission date   As evidenced by: Patient will utilize self rating of anxiety at 3 or below and demonstrated decreased signs of anxiety, or be deemed stable for discharge by MD  09/24/2014: Goal not met: Pt presents with anxious mood and affect.  Pt admitted with anxiety rating of 10.  Pt to show decreased sign of anxiety and a rating of 3 or less before d/c.     Attendees: Patient:    Family:    Physician: Dr. Parke Poisson; Dr. [name] Heck 09/24/2014 9:30 AM  Nursing: Norma Fredrickson, RN 09/24/2014 9:30 AM  Clinical Social Worker: Tilden Fossa,  [place] 09/24/2014 9:30 AM  Other: Louie Bun Smart LCSWA 09/24/2014 9:30 AM  Other: Lucinda Dell, [place] Sessions Liaison 09/24/2014 9:30 AM  Other: Lars Pinks, Case Manager 09/24/2014 9:30 AM  Other: Jackquline Denmark, NP 09/24/2014 9:30 AM  Other:    Other:    Other:    Other:    Other:       Scribe for Treatment Team:  Tilden Fossa, MSW, [place] [phone]
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
## 2014-09-24 NOTE — BHH Group Notes (Signed)
[facility] LCSW Group Therapy 09/24/2014  1:15 PM   Type of Therapy: Group Therapy  Participation Level: Did Not Attend. Patient invited to participate but declined.   Tilden Fossa, MSW, [place] Worker [facility] [phone]

## 2014-09-25 LAB — LIPID PANEL
Cholesterol: 185 mg/dL (ref 0–200)
HDL: 55 mg/dL (ref >40)
LDL Cholesterol: 96 mg/dL (ref 0–99)
Total CHOL/HDL Ratio: 3.4 RATIO
Triglycerides: 171 mg/dL — ABNORMAL HIGH (ref <150)
VLDL: 34 mg/dL (ref 0–40)

## 2014-09-25 LAB — BASIC METABOLIC PANEL
Anion gap: 8 (ref 5–15)
BUN: 12 mg/dL (ref 6–20)
CO2: 29 mmol/L (ref 22–32)
Calcium: 9.3 mg/dL (ref 8.9–10.3)
Chloride: 103 mmol/L (ref 101–111)
Creatinine, Ser: 1.04 mg/dL — ABNORMAL HIGH (ref 0.44–1.00)
GFR calc Af Amer: >60 mL/min (ref >60)
GFR calc non Af Amer: >60 mL/min (ref >60)
Glucose, Bld: 91 mg/dL (ref 65–99)
Potassium: 4 mmol/L (ref 3.5–5.1)
Sodium: 140 mmol/L (ref 135–145)

## 2014-09-25 LAB — VALPROIC ACID LEVEL: Valproic Acid Lvl: 83 ug/mL (ref 50.0–100.0)

## 2014-09-25 LAB — HCG, SERUM, QUALITATIVE: Preg, Serum: NEGATIVE

## 2014-09-25 MED ORDER — ZOLPIDEM TARTRATE 10 MG PO TABS
10.0000 mg | ORAL_TABLET | Freq: Every evening | ORAL | Status: DC | PRN
  Administered 2014-09-25 – 2014-09-27 (×3): 10 mg via ORAL
  Filled 2014-09-25 (×3): qty 1

## 2014-09-25 NOTE — BHH Suicide Risk Assessment (Signed)
[place] INPATIENT:  Family/Significant Other Suicide Prevention Education  Suicide Prevention Education:  Education Completed; mother Tahari Clabaugh [phone],  (name of family member/significant other) has been identified by the patient as the family member/significant other with whom the patient will be residing, and identified as the person(s) who will aid the patient in the event of a mental health crisis (suicidal ideations/suicide attempt).  With written consent from the patient, the family member/significant other has been provided the following suicide prevention education, prior to the and/or following the discharge of the patient.  The suicide prevention education provided includes the following:  Suicide risk factors  Suicide prevention and interventions  National Suicide Hotline telephone number  [facility] assessment telephone number  [facility] Emergency Assistance [place] and/or Residential Mobile Crisis Unit telephone number  Request made of family/significant other to:  Remove weapons (e.g., guns, rifles, knives), all items previously/currently identified as safety concern.    Remove drugs/medications (over-the-counter, prescriptions, illicit drugs), all items previously/currently identified as a safety concern.  The family member/significant other verbalizes understanding of the suicide prevention education information provided.  The family member/significant other agrees to remove the items of safety concern listed above.  Kacey Vicuna, Casimiro Needle 09/25/2014, 1:14 PM

## 2014-09-25 NOTE — Progress Notes (Signed)
[facility] MD Progress Note  09/25/2014 7:48 PM [name]  MRN:  [id number] Subjective:  [name] still reports mood instability. States she does not really know why she got to feel this way. States she is doing well at work and they have talked about giving her more responsibility and maybe promote her down the road. She feels that this might have caused some increased anxiety and self doubts. She had cut superficially before she came here Principal Problem: <principal problem not specified> Diagnosis:   Patient Active Problem List   Diagnosis Date Noted  . Bipolar I disorder, most recent episode depressed [F31.30]   . MDD (major depressive disorder), recurrent, severe, with psychosis [F33.3] 09/23/2014  . Injury of left index finger [S69.92XA] 08/20/2014  . Right ankle sprain [S93.401A] 06/01/2014  . Contusion, multiple sites [T14.8] 06/01/2014  . Strain of right gastrocnemius muscle [S86.811A] 06/01/2014  . Phonophobia [F40.298] 05/04/2014  . Photophobia of both eyes [H53.19] 05/04/2014  . 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 Anxiety state [F41.1] 05/04/2014  . Nausea with vomiting [R11.2] 05/04/2014  . Mixed bipolar I disorder [F31.60]   . Bipolar I disorder, most recent episode mixed [F31.60] 04/18/2014  . Bipolar affective disorder, depressed, severe [F31.4] 04/12/2014  . Suicidal ideation [R45.851] 04/12/2014  . Injury of right shoulder and upper arm [S49.91XA] 02/17/2014  . Left leg pain [M79.605] 06/03/2013  . Right hip pain [M25.551] 06/03/2013  . Migraine with status migrainosus [G43.901] 01/08/2013  . Personality disorder [F60.9]   . Chronic migraine [G43.709] 05/08/2012  . Contact dermatitis [L25.9] 11/27/2011  . Major depressive disorder, recurrent episode [F33.9] 10/27/2011  . Generalized anxiety disorder [F41.1] 10/27/2011  . 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 Borderline personality  disorder [F60.3] 10/27/2011  . Right foot pain [M79.671] 09/28/2011  . 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 Ganglion cyst [M67.40] 09/29/2009  . 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 BIPOLAR DISORDER UNSPECIFIED [F31.9] 06/09/2008   Total Time spent with patient: 30 minutes   Past Medical History:  Past Medical History  Diagnosis Date  . Bipolar disorder   . Eczema   . Pes planus   . Achilles tendinitis   . Hypertension   . Migraine   . Achilles tendinitis   . Ganglion cyst 09/29/2009    left wrist (2 cyst)  . Arthritis   . Allergy   . Lipoma   . ADHD (attention deficit hyperactivity disorder)   . Hyperprolactinemia   . Bipolar affective   . Personality disorder     Past Surgical History  Procedure Laterality Date  . Tumor resection left thigh    . Ankle surgery  12/88    left   . Lipoma removal    . Ganglion cyst excision  2011  . Chest nodule  1990?    rt chest wall nodule removal  . Shoulder surgery  01/13/2011    right, partial tear  . Right bunioectomy     Family History:  Family History  Problem Relation Age of Onset  . Hypertension Mother   . Hyperlipidemia Mother   . Heart attack Father   . Heart disease Father   . Hypertension Father   . Bipolar disorder Father   . Diabetes Paternal Grandfather   . Heart disease Maternal Aunt   . Breast cancer Maternal Aunt   . Heart disease Maternal Grandmother   . Cancer Maternal  Grandmother     colon   Social History:  History  Alcohol Use No     History  Drug Use No    Social History   Social History  . Marital Status: Single    Spouse Name: N/A  . Number of Children: 0  . Years of Education: N/A   Occupational History  .  [employer]   Social History Main Topics  . Smoking status: Never Smoker   . Smokeless tobacco: Never Used  . Alcohol Use: No  . Drug Use: No  . Sexual Activity: No   Other Topics Concern  . None   Social  History Narrative   Caffeine  2 sodas daily, 1 cup coffee daily.   Additional History:    Sleep: Poor  Appetite:  Fair   Assessment:   Musculoskeletal: Strength & Muscle Tone: within normal limits Gait & Station: normal Patient leans: normal   Psychiatric Specialty Exam: Physical Exam  Review of Systems  Constitutional: Negative.   HENT: Negative.   Eyes: Negative.   Respiratory: Negative.   Cardiovascular: Negative.   Gastrointestinal: Negative.   Genitourinary: Negative.   Musculoskeletal: Negative.   Skin: Negative.   Neurological: Negative.   Endo/Heme/Allergies: Negative.   Psychiatric/Behavioral: Positive for depression. The patient is nervous/anxious and has insomnia.     Blood pressure 132/81, pulse 80, temperature 98 F (36.7 C), temperature source Oral, resp. rate 16, height 5' 9"  (1.753 m), weight 109.317 kg (241 lb), SpO2 98 %.Body mass index is 35.57 kg/(m^2).  General Appearance: Fairly Groomed  Engineer, water::  Fair  Speech:  Clear and Coherent  Volume:  Normal  Mood:  Anxious and Depressed  Affect:  anxious worried  Thought Process:  Coherent and Goal Directed  Orientation:  Full (Time, Place, and Person)  Thought Content:  symptoms events worries concerns  Suicidal Thoughts:  No  Homicidal Thoughts:  No  Memory:  Immediate;   Fair Recent;   Fair Remote;   Fair  Judgement:  Fair  Insight:  Present and Shallow  Psychomotor Activity:  Normal  Concentration:  Fair  Recall:  [employer] of Knowledge:Fair  Language: Fair  Akathisia:  No  Handed:  Right  AIMS (if indicated):     Assets:  Desire for Improvement  ADL's:  Intact  Cognition: WNL  Sleep:  Number of Hours: 3.75     Current Medications: Current Facility-Administered Medications  Medication Dose Route Frequency Provider Last Rate Last Dose  . acetaminophen (TYLENOL) tablet 650 mg  650 mg Oral Q6H PRN Kerrie Buffalo, NP   650 mg at 09/24/14 1604  . alum & mag hydroxide-simeth  (MAALOX/MYLANTA) 200-200-20 MG/5ML suspension 30 mL  30 mL Oral Q4H PRN Benjamine Mola, FNP      . divalproex (DEPAKOTE ER) 24 hr tablet 1,500 mg  1,500 mg Oral QHS Benjamine Mola, FNP   1,500 mg at 09/24/14 2300  . hydrochlorothiazide (HYDRODIURIL) tablet 25 mg  25 mg Oral Daily Benjamine Mola, FNP   25 mg at 09/25/14 1287  . hydrOXYzine (ATARAX/VISTARIL) tablet 50 mg  50 mg Oral BID PRN Jenne Campus, MD   50 mg at 09/25/14 1729  . lamoTRIgine (LAMICTAL) tablet 200 mg  200 mg Oral QHS Benjamine Mola, FNP   200 mg at 09/24/14 2300  . lurasidone (LATUDA) tablet 80 mg  80 mg Oral q1800 Benjamine Mola, FNP   80 mg at 09/25/14 1728  . magnesium hydroxide (MILK  OF MAGNESIA) suspension 30 mL  30 mL Oral Daily PRN Benjamine Mola, FNP      . metoprolol succinate (TOPROL-XL) 24 hr tablet 100 mg  100 mg Oral Daily Benjamine Mola, FNP   100 mg at 09/25/14 1308  . mirtazapine (REMERON) tablet 15 mg  15 mg Oral QHS Jenne Campus, MD   15 mg at 09/24/14 2300  . multivitamin with minerals tablet 1 tablet  1 tablet Oral Daily Benjamine Mola, FNP   1 tablet at 09/25/14 6578  . neomycin-bacitracin-polymyxin (NEOSPORIN) ointment   Topical TID Kerrie Buffalo, NP      . ondansetron (ZOFRAN-ODT) disintegrating tablet 4 mg  4 mg Oral Q8H PRN Kerrie Buffalo, NP      . SUMAtriptan (IMITREX) injection 6 mg  6 mg Subcutaneous Q2H PRN Shuvon B Rankin, NP   6 mg at 09/24/14 1740  . zolpidem (AMBIEN) tablet 10 mg  10 mg Oral QHS PRN Nicholaus Bloom, MD        Lab Results:  Results for orders placed or performed during the hospital encounter of 09/23/14 (from the past 48 hour(s))  CBC with Differential/Platelet     Status: None   Collection Time: 09/24/14  7:20 PM  Result Value Ref Range   WBC 7.9 4.0 - 10.5 K/uL   RBC 4.53 3.87 - 5.11 MIL/uL   Hemoglobin 13.9 12.0 - 15.0 g/dL   HCT 42.1 36.0 - 46.0 %   MCV 92.9 78.0 - 100.0 fL   MCH 30.7 26.0 - 34.0 pg   MCHC 33.0 30.0 - 36.0 g/dL   RDW 12.7 11.5 - 15.5 %    Platelets 235 150 - 400 K/uL   Neutrophils Relative % 48 43 - 77 %   Neutro Abs 3.8 1.7 - 7.7 K/uL   Lymphocytes Relative 38 12 - 46 %   Lymphs Abs 3.0 0.7 - 4.0 K/uL   Monocytes Relative 11 3 - 12 %   Monocytes Absolute 0.8 0.1 - 1.0 K/uL   Eosinophils Relative 2 0 - 5 %   Eosinophils Absolute 0.2 0.0 - 0.7 K/uL   Basophils Relative 1 0 - 1 %   Basophils Absolute 0.0 0.0 - 0.1 K/uL    Comment: Performed at [facility]  Valproic acid level     Status: None   Collection Time: 09/25/14  6:15 AM  Result Value Ref Range   Valproic Acid Lvl 83 50.0 - 100.0 ug/mL    Comment: Performed at [place] metabolic panel     Status: Abnormal   Collection Time: 09/25/14  6:15 AM  Result Value Ref Range   Sodium 140 135 - 145 mmol/L   Potassium 4.0 3.5 - 5.1 mmol/L   Chloride 103 101 - 111 mmol/L   CO2 29 22 - 32 mmol/L   Glucose, Bld 91 65 - 99 mg/dL   BUN 12 6 - 20 mg/dL   Creatinine, Ser 1.04 (H) 0.44 - 1.00 mg/dL   Calcium 9.3 8.9 - 10.3 mg/dL   GFR calc non Af Amer >60 >60 mL/min   GFR calc Af Amer >60 >60 mL/min    Comment: (NOTE) The eGFR has been calculated using the CKD EPI equation. This calculation has not been validated in all clinical situations. eGFR's persistently <60 mL/min signify possible Chronic Kidney Disease.    Anion gap 8 5 - 15    Comment: Performed at [facility]  hCG, serum, qualitative     Status: None   Collection Time: 09/25/14  6:15 AM  Result Value Ref Range   Preg, Serum NEGATIVE NEGATIVE    Comment:        THE SENSITIVITY OF THIS METHODOLOGY IS >10 mIU/mL. Performed at [facility]   Lipid panel     Status: Abnormal   Collection Time: 09/25/14  6:15 AM  Result Value Ref Range   Cholesterol 185 0 - 200 mg/dL   Triglycerides 171 (H) <150 mg/dL   HDL 55 >40 mg/dL   Total CHOL/HDL Ratio 3.4 RATIO   VLDL 34 0 - 40 mg/dL   LDL Cholesterol 96 0 - 99 mg/dL    Comment:         Total Cholesterol/HDL:CHD Risk Coronary Heart Disease Risk Table                     Men   Women  1/2 Average Risk   3.4   3.3  Average Risk       5.0   4.4  2 X Average Risk   9.6   7.1  3 X Average Risk  23.4   11.0        Use the calculated Patient Ratio above and the CHD Risk Table to determine the patient's CHD Risk.        ATP III CLASSIFICATION (LDL):  <100     mg/dL   Optimal  100-129  mg/dL   Near or Above                    Optimal  130-159  mg/dL   Borderline  160-189  mg/dL   High  >190     mg/dL   Very High Performed at [facility]     Physical Findings: AIMS: Facial and Oral Movements Muscles of Facial Expression: None, normal Lips and Perioral Area: None, normal Jaw: None, normal Tongue: None, normal,Extremity Movements Upper (arms, wrists, hands, fingers): None, normal Lower (legs, knees, ankles, toes): None, normal, Trunk Movements Neck, shoulders, hips: None, normal, Overall Severity Severity of abnormal movements (highest score from questions above): None, normal Incapacitation due to abnormal movements: None, normal Patient's awareness of abnormal movements (rate only patient's report): No Awareness, Dental Status Current problems with teeth and/or dentures?: No Does patient usually wear dentures?: No  CIWA:    COWS:     Treatment Plan Summary: Daily contact with patient to assess and evaluate symptoms and progress in treatment and Medication management Supportive approach/coping skills Mood instability; continue to optimize response to the psychotropics Anxiety; work with CBT/mindfulness Insomnia; will increase the Ambien to 10 mg HS Help understand the possible reasons behind her behavior ie; self sabotage/self doubts  Medical Decision Making:  Review of Psycho-Social Stressors (1), Review or order clinical lab tests (1), Review of Medication Regimen & Side Effects (2) and Review of New Medication or Change in Dosage  (2)     Littie Chiem A 09/25/2014, 7:48 PM

## 2014-09-25 NOTE — Progress Notes (Addendum)
Pt appears very needy this am. She showed the nurse her abrasion on her right leg and on her left inner thigh. Pt stated he gets nervous often and is concerned about advancement at work . She stated," my boss wants me in management at [employer] . Pt states she got nervous and cut herself with some sort of knife. Pt stated she lives with her mom and that she wishes she could have her own place. Pt does contract for safety and denies SI and HI. Pt rates her depression a 6-6.5 and her anxiety a 7.Pt was medicated with 50mg  of visteral at 8am for her anxiety. She wants to identify useful coping skills . 4p-Pt has been attending all groups today and has been in the dayroom with the other pts. She stated earlier she felt very tired and was reminded that was a side effect of visteral. Pt does appear calmer and more relaxed. 5p-Pt told nurse Gerald Stabs she felt anxious. Pt was given 50mg  of visteral by Gerald Stabs./

## 2014-09-25 NOTE — Progress Notes (Signed)
Pt did not attend wrap-up group.  Pt remained in her room during group time.   [name]

## 2014-09-25 NOTE — BHH Group Notes (Signed)
[facility] LCSW Aftercare Discharge Planning Group Note  09/25/2014  8:45 AM  Participation Quality: Did Not Attend. Patient invited to participate but declined.  Tilden Fossa, MSW, [place] Worker [facility] [phone]

## 2014-09-25 NOTE — [facility] Group Notes (Signed)
[facility] LCSW Group Therapy 09/25/2014 1:15 PM Type of Therapy: Group Therapy Participation Level: Active  Participation Quality: Attentive, Sharing and Supportive  Affect: Depressed and Flat  Cognitive: Alert and Oriented  Insight: Developing/Improving and Engaged  Engagement in Therapy: Developing/Improving and Engaged  Modes of Intervention: Clarification, Confrontation, Discussion, Education, Exploration, Limit-setting, Orientation, Problem-solving, Rapport Building, Art therapist, Socialization and Support  Summary of Progress/Problems: The topic for today was feelings about relapse. Pt discussed what relapse prevention is to them and identified triggers that they are on the path to relapse. Pt processed their feeling towards relapse and was able to relate to peers. Pt discussed coping skills that can be used for relapse prevention.    Tilden Fossa, MSW, Welcome Worker [facility] [phone]

## 2014-09-26 LAB — HEMOGLOBIN A1C
Hgb A1c MFr Bld: 5.4 % (ref 4.8–5.6)
Mean Plasma Glucose: 108 mg/dL

## 2014-09-26 MED ORDER — METHOCARBAMOL 500 MG PO TABS
500.0000 mg | ORAL_TABLET | Freq: Three times a day (TID) | ORAL | Status: DC | PRN
  Administered 2014-09-26 – 2014-09-28 (×5): 500 mg via ORAL
  Filled 2014-09-26 (×5): qty 1

## 2014-09-26 MED ORDER — IBUPROFEN 600 MG PO TABS
600.0000 mg | ORAL_TABLET | ORAL | Status: DC | PRN
  Administered 2014-09-26 – 2014-09-28 (×7): 600 mg via ORAL
  Filled 2014-09-26 (×7): qty 1

## 2014-09-26 NOTE — Progress Notes (Signed)
Nursing Progress Note: 7-7p  D- Mood is depressed and irritable. Affect is blunted and appropriate. Pt is able to contract for safety. Continues to have difficulty staying asleep, states it's related to her fall last night and her pain.  A - Observed pt interacting in group and in the milieu.Support and encouragement offered, safety maintained with q 15 minutes. Group discussion included identifying needs. Approx. 6 inch abrasion noted on back from last nights fall.  R-Contracts for safety and continues to follow treatment plan, working on learning new coping skills. Pt educated on her medication. Pain medication and cold compress applied to back.

## 2014-09-26 NOTE — Progress Notes (Signed)
   09/26/14 0022  What Happened  Was fall witnessed? No  Was patient injured? Yes  Patient found other (Comment) (Fall wasn't witnessed. Pt reported fall at the nurses' stati)  Found by Staff-comment  Stated prior activity bathroom-unassisted  Follow Up  MD notified Dell Ponto NP  Time MD notified 31  Family notified No- patient refusal  Additional tests No  Simple treatment Other (comment) (medicated)  Progress note created (see row info) Yes  Adult Fall Risk Assessment  Patient's Fall Risk High Fall Risk (>13 points)  Adult Fall Risk Interventions  Required Bundle Interventions *See Row Information* High fall risk - low, moderate, and high requirements implemented  Additional Interventions Assess orthostatic BP  Fall with Injury Screening  Intervention(s) for 2 or more risk criteria identified Low Bed  Vitals  Temp 97.7 F (36.5 C)  Temp Source Oral  BP 111/78 mmHg  BP Location Left Arm  BP Method Automatic  Patient Position (if appropriate) Sitting  Pulse Rate 69  Pulse Rate Source Dinamap  Pain Assessment  Pain Assessment 0-10  Pain Score 6  Pain Type Acute pain  Pain Location Back  Pain Orientation Mid;Lower  Pain Descriptors / Indicators Aching  Pain Onset Other (Comment) (From a fall)  Patients Stated Pain Goal 0  Pain Intervention(s) Medication (See eMAR)  Multiple Pain Sites No  Neurological  Neuro (WDL) WDL  Integumentary  Integumentary (WDL) X  Skin Color Red  Skin Integrity Abrasion (From superficial cuts)  Abrasion Location Thigh;Ankle  Abrasion Location Orientation Left;Right  Pt misjudged sitting on the toilet, hit her back at the edge of the toilet.

## 2014-09-26 NOTE — Progress Notes (Signed)
Psychoeducational Group Note  Date:  09/26/2014 Time: 1015  Group Topic/Focus:  Identifying Needs:   The focus of this group is to help patients identify their personal needs that have been historically problematic and identify healthy behaviors to address their needs.  Participation Level:  Active  Participation Quality: good  Affect: flat  Cognitive:  intact  Insight:  good  Engagement in Group: engaged  Additional Comments:

## 2014-09-26 NOTE — Progress Notes (Signed)
Adult Psychoeducational Group Note  Date:  09/26/2014 Time:  8:55 PM  Group Topic/Focus:  Wrap-Up Group:   The focus of this group is to help patients review their daily goal of treatment and discuss progress on daily workbooks.  Participation Level:  Active  Participation Quality:  Appropriate  Affect:  Appropriate  Cognitive:  Appropriate  Insight: Appropriate  Engagement in Group:  Engaged  Modes of Intervention:  Discussion  Additional Comments: The patient expressed that she attended group.The patient also said that she rates her day a 7 out of 10.  Nash Shearer 09/26/2014, 8:55 PM

## 2014-09-26 NOTE — Progress Notes (Signed)
D: Pt is alert and oriented x4. Pt has isolated herself most of the evening because the feels her peers have been making fun of her; she states, "I got up to ask that lady to lower her voice and I continue to her them saying 'lower your voice' while I was in the room; to avoid conflict I decided to stay in my room." Pt verbalizes increased depression of 8 on a 0-10 depression scale. Pt also endorses moderate anxiety of 4 on a 0-10 anxiety scale. Pt denies SI/HI/AVH.   A: Medications administered as prescribed. Support, encouragement, and safe environment provided.   R: Pt was med compliant.  15-minute safety check continues

## 2014-09-26 NOTE — Progress Notes (Signed)
Patient ID: [name], female   DOB: 18-Apr-1970, 44 y.o.   MRN: [id number] [facility] MD Progress Note  09/26/2014 2:26 PM Moncerrath Berhe Seher   MRN:  [id number]  Subjective:  [name] says she is still feeling a lot of anxiety, not sleeping well & still suicidal off & on. Denies any intent or plans to hurt herself. She reports that she was trying to use the bathroom last night & accidentally missed the toilet & fell. She says she scratched her back on the commode. [name] has an an abrasion to back area with some bruises. No drainage noted. She is complaining of a lot of muscle pain. Initiated Robaxin 500 mg together with Ibuprofen 600 mg prn for pain. She appears to be in no apparent distress. She is participating in group sessions.  Principal Problem: <principal problem not specified>  Diagnosis:   Patient Active Problem List   Diagnosis Date Noted  . Bipolar I disorder, most recent episode depressed [F31.30]   . MDD (major depressive disorder), recurrent, severe, with psychosis [F33.3] 09/23/2014  . Injury of left index finger [S69.92XA] 08/20/2014  . Right ankle sprain [S93.401A] 06/01/2014  . Contusion, multiple sites [T14.8] 06/01/2014  . Strain of right gastrocnemius muscle [S86.811A] 06/01/2014  . Phonophobia [F40.298] 05/04/2014  . Photophobia of both eyes [H53.19] 05/04/2014  . 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 Anxiety state [F41.1] 05/04/2014  . Nausea with vomiting [R11.2] 05/04/2014  . Mixed bipolar I disorder [F31.60]   . Bipolar I disorder, most recent episode mixed [F31.60] 04/18/2014  . Bipolar affective disorder, depressed, severe [F31.4] 04/12/2014  . Suicidal ideation [R45.851] 04/12/2014  . Injury of right shoulder and upper arm [S49.91XA] 02/17/2014  . Left leg pain [M79.605] 06/03/2013  . Right hip pain [M25.551] 06/03/2013  . Migraine with status migrainosus [G43.901] 01/08/2013  . Personality disorder [F60.9]   . Chronic migraine  [G43.709] 05/08/2012  . Contact dermatitis [L25.9] 11/27/2011  . Major depressive disorder, recurrent episode [F33.9] 10/27/2011  . Generalized anxiety disorder [F41.1] 10/27/2011  . 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 Borderline personality disorder [F60.3] 10/27/2011  . Right foot pain [M79.671] 09/28/2011  . 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 Ganglion cyst [M67.40] 09/29/2009  . 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 BIPOLAR DISORDER UNSPECIFIED [F31.9] 06/09/2008   Total Time spent with patient: 25 minutes  Past Medical History:  Past Medical History  Diagnosis Date  . Bipolar disorder   . Eczema   . Pes planus   . Achilles tendinitis   . Hypertension   . Migraine   . Achilles tendinitis   . Ganglion cyst 09/29/2009    left wrist (2 cyst)  . Arthritis   . Allergy   . Lipoma   . ADHD (attention deficit hyperactivity disorder)   . Hyperprolactinemia   . Bipolar affective   . Personality disorder     Past Surgical History  Procedure Laterality Date  . Tumor resection left thigh    . Ankle surgery  12/88    left   . Lipoma removal    . Ganglion cyst excision  2011  . Chest nodule  1990?    rt chest wall nodule removal  . Shoulder surgery  01/13/2011    right, partial tear  . Right bunioectomy     Family History:  Family History  Problem Relation Age of Onset  . Hypertension  Mother   . Hyperlipidemia Mother   . Heart attack Father   . Heart disease Father   . Hypertension Father   . Bipolar disorder Father   . Diabetes Paternal Grandfather   . Heart disease Maternal Aunt   . Breast cancer Maternal Aunt   . Heart disease Maternal Grandmother   . Cancer Maternal Grandmother     colon   Social History:  History  Alcohol Use No     History  Drug Use No    Social History   Social History  . Marital Status: Single    Spouse Name: N/A   . Number of Children: 0  . Years of Education: N/A   Occupational History  .  [employer]   Social History Main Topics  . Smoking status: Never Smoker   . Smokeless tobacco: Never Used  . Alcohol Use: No  . Drug Use: No  . Sexual Activity: No   Other Topics Concern  . None   Social History Narrative   Caffeine  2 sodas daily, 1 cup coffee daily.   Additional History:    Sleep: Poor  Appetite:  Fair  Assessment: Major depressive disorder, recurrent with psychosis.  Musculoskeletal: Strength & Muscle Tone: within normal limits Gait & Station: normal Patient leans: normal   Psychiatric Specialty Exam: Physical Exam  Review of Systems  Constitutional: Negative.   HENT: Negative.   Eyes: Negative.   Respiratory: Negative.   Cardiovascular: Negative.   Gastrointestinal: Negative.   Genitourinary: Negative.   Musculoskeletal: Negative.   Skin: Negative.   Neurological: Negative.   Endo/Heme/Allergies: Negative.   Psychiatric/Behavioral: Positive for depression. The patient is nervous/anxious and has insomnia.     Blood pressure 124/76, pulse 61, temperature 98 F (36.7 C), temperature source Oral, resp. rate 16, height _0  (1.753 m), weight 109.317 kg (241 lb), SpO2 98 %.Body mass index is 35.57 kg/(m^2).  General Appearance: Fairly Groomed  Engineer, water::  Fair  Speech:  Clear and Coherent  Volume:  Normal  Mood:  Anxious and Depressed  Affect:  anxious worried  Thought Process:  Coherent and Goal Directed  Orientation:  Full (Time, Place, and Person)  Thought Content:  symptoms events worries concerns  Suicidal Thoughts:  No  Homicidal Thoughts:  No  Memory:  Immediate;   Fair Recent;   Fair Remote;   Fair  Judgement:  Fair  Insight:  Present and Shallow  Psychomotor Activity:  Normal  Concentration:  Fair  Recall:  [employer] of Knowledge:Fair  Language: Fair  Akathisia:  No  Handed:  Right  AIMS (if indicated):     Assets:  Desire for Improvement   ADL's:  Intact  Cognition: WNL  Sleep:  Number of Hours: 3.75   Current Medications: Current Facility-Administered Medications  Medication Dose Route Frequency Provider Last Rate Last Dose  . acetaminophen (TYLENOL) tablet 650 mg  650 mg Oral Q6H PRN Kerrie Buffalo, NP   650 mg at 09/26/14 0504  . alum & mag hydroxide-simeth (MAALOX/MYLANTA) 200-200-20 MG/5ML suspension 30 mL  30 mL Oral Q4H PRN Benjamine Mola, FNP      . divalproex (DEPAKOTE ER) 24 hr tablet 1,500 mg  1,500 mg Oral QHS Benjamine Mola, FNP   1,500 mg at 09/25/14 2221  . hydrochlorothiazide (HYDRODIURIL) tablet 25 mg  25 mg Oral Daily Benjamine Mola, FNP   25 mg at 09/26/14 0834  . hydrOXYzine (ATARAX/VISTARIL) tablet 50 mg  50 mg Oral BID  PRN Jenne Campus, MD   50 mg at 09/26/14 1032  . ibuprofen (ADVIL,MOTRIN) tablet 600 mg  600 mg Oral Q4H PRN Niel Hummer, NP   600 mg at 09/26/14 1135  . lamoTRIgine (LAMICTAL) tablet 200 mg  200 mg Oral QHS Benjamine Mola, FNP   200 mg at 09/25/14 2221  . lurasidone (LATUDA) tablet 80 mg  80 mg Oral q1800 Benjamine Mola, FNP   80 mg at 09/25/14 1728  . magnesium hydroxide (MILK OF MAGNESIA) suspension 30 mL  30 mL Oral Daily PRN Benjamine Mola, FNP      . metoprolol succinate (TOPROL-XL) 24 hr tablet 100 mg  100 mg Oral Daily Benjamine Mola, FNP   100 mg at 09/26/14 0834  . mirtazapine (REMERON) tablet 15 mg  15 mg Oral QHS Jenne Campus, MD   15 mg at 09/25/14 2221  . multivitamin with minerals tablet 1 tablet  1 tablet Oral Daily Benjamine Mola, FNP   1 tablet at 09/26/14 [phone]  . neomycin-bacitracin-polymyxin (NEOSPORIN) ointment   Topical TID Kerrie Buffalo, NP   1 application at 72/62/03 1137  . ondansetron (ZOFRAN-ODT) disintegrating tablet 4 mg  4 mg Oral Q8H PRN Kerrie Buffalo, NP      . SUMAtriptan (IMITREX) injection 6 mg  6 mg Subcutaneous Q2H PRN Shuvon B Rankin, NP   6 mg at 09/24/14 1740  . zolpidem (AMBIEN) tablet 10 mg  10 mg Oral QHS PRN Nicholaus Bloom, MD   10 mg at  09/25/14 2222    Lab Results:  Results for orders placed or performed during the hospital encounter of 09/23/14 (from the past 48 hour(s))  CBC with Differential/Platelet     Status: None   Collection Time: 09/24/14  7:20 PM  Result Value Ref Range   WBC 7.9 4.0 - 10.5 K/uL   RBC 4.53 3.87 - 5.11 MIL/uL   Hemoglobin 13.9 12.0 - 15.0 g/dL   HCT 42.1 36.0 - 46.0 %   MCV 92.9 78.0 - 100.0 fL   MCH 30.7 26.0 - 34.0 pg   MCHC 33.0 30.0 - 36.0 g/dL   RDW 12.7 11.5 - 15.5 %   Platelets 235 150 - 400 K/uL   Neutrophils Relative % 48 43 - 77 %   Neutro Abs 3.8 1.7 - 7.7 K/uL   Lymphocytes Relative 38 12 - 46 %   Lymphs Abs 3.0 0.7 - 4.0 K/uL   Monocytes Relative 11 3 - 12 %   Monocytes Absolute 0.8 0.1 - 1.0 K/uL   Eosinophils Relative 2 0 - 5 %   Eosinophils Absolute 0.2 0.0 - 0.7 K/uL   Basophils Relative 1 0 - 1 %   Basophils Absolute 0.0 0.0 - 0.1 K/uL    Comment: Performed at [facility]  Valproic acid level     Status: None   Collection Time: 09/25/14  6:15 AM  Result Value Ref Range   Valproic Acid Lvl 83 50.0 - 100.0 ug/mL    Comment: Performed at [facility]  Hemoglobin A1c     Status: None   Collection Time: 09/25/14  6:15 AM  Result Value Ref Range   Hgb A1c MFr Bld 5.4 4.8 - 5.6 %    Comment: (NOTE)         Pre-diabetes: 5.7 - 6.4         Diabetes: >6.4         Glycemic control  for adults with diabetes: <7.0    Mean Plasma Glucose 108 mg/dL    Comment: (NOTE) Performed At: [facility] [place], [place] [id number] Lindon Romp MD EX:[id number] Performed at [facility] metabolic panel     Status: Abnormal   Collection Time: 09/25/14  6:15 AM  Result Value Ref Range   Sodium 140 135 - 145 mmol/L   Potassium 4.0 3.5 - 5.1 mmol/L   Chloride 103 101 - 111 mmol/L   CO2 29 22 - 32 mmol/L   Glucose, Bld 91 65 - 99 mg/dL   BUN 12 6 - 20 mg/dL   Creatinine, Ser 1.04 (H) 0.44 -  1.00 mg/dL   Calcium 9.3 8.9 - 10.3 mg/dL   GFR calc non Af Amer >60 >60 mL/min   GFR calc Af Amer >60 >60 mL/min    Comment: (NOTE) The eGFR has been calculated using the CKD EPI equation. This calculation has not been validated in all clinical situations. eGFR's persistently <60 mL/min signify possible Chronic Kidney Disease.    Anion gap 8 5 - 15    Comment: Performed at [facility]  hCG, serum, qualitative     Status: None   Collection Time: 09/25/14  6:15 AM  Result Value Ref Range   Preg, Serum NEGATIVE NEGATIVE    Comment:        THE SENSITIVITY OF THIS METHODOLOGY IS >10 mIU/mL. Performed at [facility]   Lipid panel     Status: Abnormal   Collection Time: 09/25/14  6:15 AM  Result Value Ref Range   Cholesterol 185 0 - 200 mg/dL   Triglycerides 171 (H) <150 mg/dL   HDL 55 >40 mg/dL   Total CHOL/HDL Ratio 3.4 RATIO   VLDL 34 0 - 40 mg/dL   LDL Cholesterol 96 0 - 99 mg/dL    Comment:        Total Cholesterol/HDL:CHD Risk Coronary Heart Disease Risk Table                     Men   Women  1/2 Average Risk   3.4   3.3  Average Risk       5.0   4.4  2 X Average Risk   9.6   7.1  3 X Average Risk  23.4   11.0        Use the calculated Patient Ratio above and the CHD Risk Table to determine the patient's CHD Risk.        ATP III CLASSIFICATION (LDL):  <100     mg/dL   Optimal  100-129  mg/dL   Near or Above                    Optimal  130-159  mg/dL   Borderline  160-189  mg/dL   High  >190     mg/dL   Very High Performed at [facility]     Physical Findings: AIMS: Facial and Oral Movements Muscles of Facial Expression: None, normal Lips and Perioral Area: None, normal Jaw: None, normal Tongue: None, normal,Extremity Movements Upper (arms, wrists, hands, fingers): None, normal Lower (legs, knees, ankles, toes): None, normal, Trunk Movements Neck, shoulders, hips: None, normal, Overall Severity Severity of  abnormal movements (highest score from questions above): None, normal Incapacitation due to abnormal movements: None, normal Patient's awareness of abnormal movements (rate only patient's report): No Awareness,  Dental Status Current problems with teeth and/or dentures?: No Does patient usually wear dentures?: No  CIWA:    COWS:     Treatment Plan Summary: Daily contact with patient to assess and evaluate symptoms and progress in treatment and Medication management Supportive approach/coping skills Mood instability; continue to optimize response to the psychotropics Anxiety; work with CBT/mindfulness Insomnia; will increase the Ambien to 10 mg HS. Muscle pain: start Robaxin 500 mg prn . Help understand the possible reasons behind her behavior ie; self sabotage/self doubts  Medical Decision Making:  Review of Psycho-Social Stressors (1), Review or order clinical lab tests (1), Review of Medication Regimen & Side Effects (2) and Review of New Medication or Change in Dosage (2)   Encarnacion Slates, PMHNp, FNP-BC 09/26/2014, 2:26 PM I agree with assessment and plan Geralyn Flash A. [name] Heck, M.D.

## 2014-09-26 NOTE — [facility] Group Notes (Signed)
CSW was unable to hold group today due to patients being involved in another activity at group time.  Selmer Dominion, LCSW 09/26/2014, 3:34 PM

## 2014-09-27 MED ORDER — METHOCARBAMOL 500 MG PO TABS
500.0000 mg | ORAL_TABLET | Freq: Once | ORAL | Status: AC
  Administered 2014-09-27: 500 mg via ORAL
  Filled 2014-09-27 (×2): qty 1

## 2014-09-27 NOTE — Progress Notes (Signed)
D: Patient pleasant and cooperative with RN on assessment. Pt brightens on approach and jokes appropriately with staff. Pt in dayroom interacting well with peers in milieu. Pt denies SI or plans to harm herself. Fall precautions/prevention reviewed with patient. A: Q 15 minute safety checks, encourage staff/peer interaction, group participation, and medication compliance.  R: Patient compliant with medications and group session. Pt denies SI and verbally contracts for safety.

## 2014-09-27 NOTE — Progress Notes (Signed)
Psychoeducational Group Note  Date: 09/27/2014 Time:  0930 Group Topic/Focus:  Gratefulness:  The focus of this group is to help patients identify what two things they are most grateful for in their lives. What helps ground them and to center them on their work to their recovery.  Participation Level:  Active  Participation Quality:  Appropriate  Affect:  Appropriate  Cognitive:  Oriented  Insight:  Improving  Engagement in Group:  Engaged  Additional Comments:    Paulino Rily

## 2014-09-27 NOTE — [facility] Group Notes (Signed)
[place] Group Notes:  (Clinical Social Work)  09/27/2014  1:15-2:15PM  Summary of Progress/Problems:   The main focus of today's process group was to   1)  discuss the importance of adding supports  2)  define health supports versus unhealthy supports  3)  identify the patient's current unhealthy supports and plan how to handle them  4)  Identify the patient's current healthy supports and plan what to add.  An emphasis was placed on using counselor, doctor, therapy groups, 12-step groups, and problem-specific support groups to expand supports.    The patient expressed full comprehension of the concepts presented, and agreed that there is a need to add more supports.  The patient stated her new therapist treats her as a whole person, sees the whole picture, and that is helpful.  She was annoyed during group at the monopolization by another patient, wanted to walk out but did not do so out of respect for CSW.  She said she was told she could discharge tomorrow if she feels good, and that has had her in an up and down mode all day long, because she thinks she is ready, but then will get upset and think she is not ready.  Type of Therapy:  Process Group with Motivational Interviewing  Participation Level:  Minimal  Participation Quality:  Resistant  Affect:  Irritable  Cognitive:  Oriented  Insight:  Limited  Engagement in Therapy:  Engaged  Modes of Intervention:   Education, Support and Processing, Activity  Selmer Dominion, LCSW 09/27/2014

## 2014-09-27 NOTE — Progress Notes (Signed)
D.  Pt pleasant on approach, stated she really enjoyed the student nurse she had this weekend, Lilia Pro, and that she really helped Pt to feel more positive.  States that student had a great deal of insight.  Pt continues with complaint of intense back pain, states at night she can barely lay on her back.  Denies SI/HI/hallucinatons at this time.  Positive for evening wrap up group and interacting appropriately with peers on the unit.  A.  Support and encouragement offered, praised Pt for her progress and interaction with student.  Medication given as ordered for back pain, NP notified of Pt request for Robaxin to be available every six rather than every eight hours as needed.  R.  Pt remains safe on the unit, will continue to monitor.

## 2014-09-27 NOTE — Progress Notes (Addendum)
Patient ID: [name], female   DOB: [birth year], 44 y.o.   MRN: [id number] Patient ID: [name], female   DOB: 11-11-1970, 44 y.o.   MRN: [id number] [facility] MD Progress Note  09/27/2014 2:33 PM [name]   MRN:  [id number]  Subjective:  [name] says she is feeling a lot better today. She says the group session this am was intense & informative. Says it made her to think in a positive way. Feels her mood is stabilizing. Feels ready to be discharged in am. Denies any adverse effects from her treatment regimen. Denies any SIHI, AVH. Tolerating her medications well. She is active in the group milieu.  Principal Problem: <principal problem not specified>  Diagnosis:   Patient Active Problem List   Diagnosis Date Noted  . Bipolar I disorder, most recent episode depressed [F31.30]   . MDD (major depressive disorder), recurrent, severe, with psychosis [F33.3] 09/23/2014  . Injury of left index finger [S69.92XA] 08/20/2014  . Right ankle sprain [S93.401A] 06/01/2014  . Contusion, multiple sites [T14.8] 06/01/2014  . Strain of right gastrocnemius muscle [S86.811A] 06/01/2014  . Phonophobia [F40.298] 05/04/2014  . Photophobia of both eyes [H53.19] 05/04/2014  . 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 Anxiety state [F41.1] 05/04/2014  . Nausea with vomiting [R11.2] 05/04/2014  . Mixed bipolar I disorder [F31.60]   . Bipolar I disorder, most recent episode mixed [F31.60] 04/18/2014  . Bipolar affective disorder, depressed, severe [F31.4] 04/12/2014  . Suicidal ideation [R45.851] 04/12/2014  . Injury of right shoulder and upper arm [S49.91XA] 02/17/2014  . Left leg pain [M79.605] 06/03/2013  . Right hip pain [M25.551] 06/03/2013  . Migraine with status migrainosus [G43.901] 01/08/2013  . Personality disorder [F60.9]   . Chronic migraine [G43.709] 05/08/2012  . Contact dermatitis [L25.9] 11/27/2011  . Major depressive disorder, recurrent episode  [F33.9] 10/27/2011  . Generalized anxiety disorder [F41.1] 10/27/2011  . 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 Borderline personality disorder [F60.3] 10/27/2011  . Right foot pain [M79.671] 09/28/2011  . 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 Ganglion cyst [M67.40] 09/29/2009  . 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 BIPOLAR DISORDER UNSPECIFIED [F31.9] 06/09/2008   Total Time spent with patient: 25 minutes  Past Medical History:  Past Medical History  Diagnosis Date  . Bipolar disorder   . Eczema   . Pes planus   . Achilles tendinitis   . Hypertension   . Migraine   . Achilles tendinitis   . Ganglion cyst 09/29/2009    left wrist (2 cyst)  . Arthritis   . Allergy   . Lipoma   . ADHD (attention deficit hyperactivity disorder)   . Hyperprolactinemia   . Bipolar affective   . Personality disorder     Past Surgical History  Procedure Laterality Date  . Tumor resection left thigh    . Ankle surgery  12/88    left   . Lipoma removal    . Ganglion cyst excision  2011  . Chest nodule  1990?    rt chest wall nodule removal  . Shoulder surgery  01/13/2011    right, partial tear  . Right bunioectomy     Family History:  Family History  Problem Relation Age of Onset  . Hypertension Mother   . Hyperlipidemia Mother   . Heart attack Father   . Heart disease Father   . Hypertension Father   .  Bipolar disorder Father   . Diabetes Paternal Grandfather   . Heart disease Maternal Aunt   . Breast cancer Maternal Aunt   . Heart disease Maternal Grandmother   . Cancer Maternal Grandmother     colon   Social History:  History  Alcohol Use No     History  Drug Use No    Social History   Social History  . Marital Status: Single    Spouse Name: N/A  . Number of Children: 0  . Years of Education: N/A   Occupational History  .  [employer]   Social History Main  Topics  . Smoking status: Never Smoker   . Smokeless tobacco: Never Used  . Alcohol Use: No  . Drug Use: No  . Sexual Activity: No   Other Topics Concern  . None   Social History Narrative   Caffeine  2 sodas daily, 1 cup coffee daily.   Additional History:    Sleep: Poor  Appetite:  Fair  Assessment: Major depressive disorder, recurrent with psychosis.  Musculoskeletal: Strength & Muscle Tone: within normal limits Gait & Station: normal Patient leans: normal  Psychiatric Specialty Exam: Physical Exam  Review of Systems  Constitutional: Negative.   HENT: Negative.   Eyes: Negative.   Respiratory: Negative.   Cardiovascular: Negative.   Gastrointestinal: Negative.   Genitourinary: Negative.   Musculoskeletal: Negative.   Skin: Negative.   Neurological: Negative.   Endo/Heme/Allergies: Negative.   Psychiatric/Behavioral: Positive for depression. The patient is nervous/anxious and has insomnia.     Blood pressure 110/84, pulse 76, temperature 98 F (36.7 C), temperature source Oral, resp. rate 18, height 5\' 9"  (1.753 m), weight 109.317 kg (241 lb), SpO2 98 %.Body mass index is 35.57 kg/(m^2).  General Appearance: Fairly Groomed  Engineer, water::  Fair  Speech:  Clear and Coherent  Volume:  Normal  Mood: "My mood is improving quite much"  Affect: "Congruent with mood"  Thought Process:  Coherent and Goal Directed  Orientation:  Full (Time, Place, and Person)  Thought Content:  Denies any hallucinations, delusions or paranoia  Suicidal Thoughts:  No  Homicidal Thoughts:  No  Memory:  Immediate;   Fair Recent;   Fair Remote;   Fair  Judgement:  Fair  Insight:  Present and Shallow  Psychomotor Activity:  Normal  Concentration:  Fair  Recall:  [employer] of Knowledge:Fair  Language: Fair  Akathisia:  No  Handed:  Right  AIMS (if indicated):     Assets:  Desire for Improvement  ADL's:  Intact  Cognition: WNL  Sleep:  Number of Hours: 6   Current  Medications: Current Facility-Administered Medications  Medication Dose Route Frequency Provider Last Rate Last Dose  . acetaminophen (TYLENOL) tablet 650 mg  650 mg Oral Q6H PRN Kerrie Buffalo, NP   650 mg at 09/26/14 0504  . alum & mag hydroxide-simeth (MAALOX/MYLANTA) 200-200-20 MG/5ML suspension 30 mL  30 mL Oral Q4H PRN Benjamine Mola, FNP      . divalproex (DEPAKOTE ER) 24 hr tablet 1,500 mg  1,500 mg Oral QHS Benjamine Mola, FNP   1,500 mg at 09/26/14 2202  . hydrochlorothiazide (HYDRODIURIL) tablet 25 mg  25 mg Oral Daily Benjamine Mola, FNP   25 mg at 09/27/14 0746  . hydrOXYzine (ATARAX/VISTARIL) tablet 50 mg  50 mg Oral BID PRN Jenne Campus, MD   50 mg at 09/27/14 1215  . ibuprofen (ADVIL,MOTRIN) tablet 600 mg  600 mg Oral  Q4H PRN Niel Hummer, NP   600 mg at 09/27/14 4536  . lamoTRIgine (LAMICTAL) tablet 200 mg  200 mg Oral QHS Benjamine Mola, FNP   200 mg at 09/26/14 2202  . lurasidone (LATUDA) tablet 80 mg  80 mg Oral q1800 Benjamine Mola, FNP   80 mg at 09/26/14 1717  . magnesium hydroxide (MILK OF MAGNESIA) suspension 30 mL  30 mL Oral Daily PRN Benjamine Mola, FNP      . methocarbamol (ROBAXIN) tablet 500 mg  500 mg Oral Q8H PRN Encarnacion Slates, NP   500 mg at 09/27/14 0752  . metoprolol succinate (TOPROL-XL) 24 hr tablet 100 mg  100 mg Oral Daily Benjamine Mola, FNP   100 mg at 09/27/14 0746  . mirtazapine (REMERON) tablet 15 mg  15 mg Oral QHS Jenne Campus, MD   15 mg at 09/26/14 2202  . multivitamin with minerals tablet 1 tablet  1 tablet Oral Daily Benjamine Mola, FNP   1 tablet at 09/27/14 0746  . neomycin-bacitracin-polymyxin (NEOSPORIN) ointment   Topical TID Kerrie Buffalo, NP      . ondansetron (ZOFRAN-ODT) disintegrating tablet 4 mg  4 mg Oral Q8H PRN Kerrie Buffalo, NP      . SUMAtriptan (IMITREX) injection 6 mg  6 mg Subcutaneous Q2H PRN Shuvon B Rankin, NP   6 mg at 09/24/14 1740  . zolpidem (AMBIEN) tablet 10 mg  10 mg Oral QHS PRN Nicholaus Bloom, MD   10 mg at  09/26/14 2202    Lab Results:  No results found for this or any previous visit (from the past 48 hour(s)).  Physical Findings: AIMS: Facial and Oral Movements Muscles of Facial Expression: None, normal Lips and Perioral Area: None, normal Jaw: None, normal Tongue: None, normal,Extremity Movements Upper (arms, wrists, hands, fingers): None, normal Lower (legs, knees, ankles, toes): None, normal, Trunk Movements Neck, shoulders, hips: None, normal, Overall Severity Severity of abnormal movements (highest score from questions above): None, normal Incapacitation due to abnormal movements: None, normal Patient's awareness of abnormal movements (rate only patient's report): No Awareness, Dental Status Current problems with teeth and/or dentures?: No Does patient usually wear dentures?: No  CIWA:    COWS:     Treatment Plan Summary: Daily contact with patient to assess and evaluate symptoms and progress in treatment and Medication management Supportive approach/coping skills Mood instability; continue to optimize response to the psychotropics Anxiety; work with CBT/mindfulness Insomnia; will increase the Ambien to 10 mg HS. Muscle pain: start Robaxin 500 mg prn . Help understand the possible reasons behind her behavior ie; self sabotage/self doubts. Patient stable for discharge in a.m.  Medical Decision Making:  Review of Psycho-Social Stressors (1), Review or order clinical lab tests (1), Review of Medication Regimen & Side Effects (2) and Review of New Medication or Change in Dosage (2)   Encarnacion Slates, PMHNp, FNP-BC 09/27/2014, 2:33 PM I agree with assessment and plan Geralyn Flash A. [name] Heck, M.D.

## 2014-09-27 NOTE — Progress Notes (Signed)
Psychoeducational Group Note  Date:  09/27/2014 Time:  1015  Group Topic/Focus:  Making Healthy Choices:   The focus of this group is to help patients identify negative/unhealthy choices they were using prior to admission and identify positive/healthier coping strategies to replace them upon discharge.  Participation Level:  Active  Participation Quality:  Appropriate  Affect:  Appropriate  Cognitive:  Appropriate  Insight:  Improving  Engagement in Group:  Engaged  Additional Comments:    Paulino Rily 09/27/2014

## 2014-09-28 MED ORDER — ZOLPIDEM TARTRATE 10 MG PO TABS: 10.0000 mg | ORAL_TABLET | Freq: Every evening | ORAL | Status: DC | PRN

## 2014-09-28 MED ORDER — SUMATRIPTAN SUCCINATE 6 MG/0.5ML ~~LOC~~ SOLN: 6.0000 mg | SUBCUTANEOUS | Status: DC | PRN

## 2014-09-28 MED ORDER — METOPROLOL SUCCINATE ER 100 MG PO TB24: 100.0000 mg | ORAL_TABLET | Freq: Every day | ORAL | Status: DC

## 2014-09-28 MED ORDER — METHOCARBAMOL 500 MG PO TABS: 500.0000 mg | ORAL_TABLET | Freq: Four times a day (QID) | ORAL | Status: DC | PRN

## 2014-09-28 MED ORDER — MIRTAZAPINE 15 MG PO TABS: 15.0000 mg | ORAL_TABLET | Freq: Every day | ORAL | Status: DC

## 2014-09-28 MED ORDER — ZOLPIDEM TARTRATE 10 MG PO TABS: 10.0000 mg | ORAL_TABLET | Freq: Every evening | ORAL | Status: AC | PRN

## 2014-09-28 MED ORDER — DIVALPROEX SODIUM ER 500 MG PO TB24: 1500.0000 mg | ORAL_TABLET | Freq: Every day | ORAL | Status: DC

## 2014-09-28 MED ORDER — ADULT MULTIVITAMIN W/MINERALS CH: 1.0000 | ORAL_TABLET | Freq: Every day | ORAL | Status: DC

## 2014-09-28 MED ORDER — LAMOTRIGINE 200 MG PO TABS: 200.0000 mg | ORAL_TABLET | Freq: Every day | ORAL | Status: DC

## 2014-09-28 MED ORDER — LURASIDONE HCL 80 MG PO TABS: 80.0000 mg | ORAL_TABLET | Freq: Every day | ORAL | Status: DC

## 2014-09-28 MED ORDER — HYDROCHLOROTHIAZIDE 25 MG PO TABS: 25.0000 mg | ORAL_TABLET | Freq: Every day | ORAL | Status: DC

## 2014-09-28 MED ORDER — HYDROXYZINE HCL 50 MG PO TABS: 50.0000 mg | ORAL_TABLET | Freq: Two times a day (BID) | ORAL | Status: DC | PRN

## 2014-09-28 NOTE — Progress Notes (Signed)
  [facility] Adult Case Management Discharge Plan :  Will you be returning to the same living situation after discharge:  Yes,  patient plans to return home with her mother At discharge, do you have transportation home?: Yes,  patient reports access to transportation Do you have the ability to pay for your medications: Yes,  patient will be provided with prescriptions at discharge  Release of information consent forms completed and in the chart;  Patient's signature needed at discharge.  Patient to Follow up at: Follow-up Information    Follow up with [facility].   Why:  Medication management appt on Wednesday Sept. 7th at 1pm with Pauline Good, NP & on Monday Sept. 19th at 10:15am with Dr. Caprice Beaver.    Contact information:   [address], [place], [place] [address] Phone: [phone]                 Follow up with [facility] On 09/30/2014.   Why:  Therapy appointment with Ruben Reason on Wednesday August 31st at 12pm. Please call office if you need to reschedule.   Contact information:   [address] [place], [place], [place] [address]  [phone]      Patient denies SI/HI: Yes,  denies    Safety Planning and Suicide Prevention discussed: Yes,  with patient and mother  Have you used any form of tobacco in the last 30 days? (Cigarettes, Smokeless Tobacco, Cigars, and/or Pipes): No  Has patient been referred to the Quitline?: N/A patient is not a smoker  Lynda Wanninger L 09/28/2014, 9:35 AM

## 2014-09-28 NOTE — Progress Notes (Signed)
Pt d/c from the hospital with her mother. All items returned. D/C instructions given and prescriptions given.

## 2014-09-28 NOTE — Progress Notes (Signed)
Recreation Therapy Notes  Date: 08.29.2016 Time: 9:30am Location: [address] Group Room   Group Topic: Stress Management  Goal Area(s) Addresses:  Patient will actively participate in stress management techniques presented during session.   Behavioral Response: Did not attend.   Laureen Ochs Mylie Mccurley, LRT/CTRS        Aniko Finnigan L 09/28/2014 1:52 PM

## 2014-09-28 NOTE — BHH Group Notes (Signed)
   [name] LCSW Aftercare Discharge Planning Group Note  09/28/2014  8:45 AM   Participation Quality: Alert, Appropriate and Oriented  Mood/Affect: Depressed and Flat  Depression Rating: 5  Anxiety Rating: 7  Thoughts of Suicide: Pt denies SI/HI  Will you contract for safety? Yes  Current AVH: Pt denies  Plan for Discharge/Comments: Pt attended discharge planning group and actively participated in group. CSW provided pt with today's workbook. Patient reports experiencing pain today. She plans to return home with her mother to follow up with outpatient services at discharge.  Transportation Means: Pt reports access to transportation  Supports: No supports mentioned at this time  Tilden Fossa, MSW, [place] Social Worker [employer] [phone]

## 2014-09-28 NOTE — BHH Suicide Risk Assessment (Signed)
[facility] Discharge Suicide Risk Assessment   Demographic Factors:  Caucasian  Total Time spent with patient: 30 minutes  Musculoskeletal: Strength & Muscle Tone: within normal limits Gait & Station: normal Patient leans: normal  Psychiatric Specialty Exam: Physical Exam  Review of Systems  Constitutional: Negative.   HENT: Negative.   Eyes: Negative.   Respiratory: Negative.   Cardiovascular: Negative.   Gastrointestinal: Negative.   Genitourinary: Negative.   Musculoskeletal: Positive for back pain.  Skin: Negative.   Neurological: Negative.   Endo/Heme/Allergies: Negative.   Psychiatric/Behavioral: Positive for depression.    Blood pressure 114/73, pulse 72, temperature 97.6 F (36.4 C), temperature source Oral, resp. rate 18, height 5\' 9"  (1.753 m), weight 109.317 kg (241 lb), SpO2 98 %.Body mass index is 35.57 kg/(m^2).  General Appearance: Fairly Groomed  Engineer, water::  Fair  Speech:  Clear and [id number]  Volume:  Normal  Mood:  in pain  Affect:  Appropriate  Thought Process:  Coherent and Goal Directed  Orientation:  Full (Time, Place, and Person)  Thought Content:  symptoms events worries concerns plans as she moves on  Suicidal Thoughts:  No  Homicidal Thoughts:  No  Memory:  Immediate;   Fair Recent;   Fair Remote;   Fair  Judgement:  Fair  Insight:  Present  Psychomotor Activity:  Normal  Concentration:  Fair  Recall:  [employer] of [place]  Language: Fair  Akathisia:  No  Handed:  Right  AIMS (if indicated):     Assets:  Desire for Improvement Housing Social Support Transportation Vocational/Educational  Sleep:  Number of Hours: 6  Cognition: WNL  ADL's:  Intact   Have you used any form of tobacco in the last 30 days? (Cigarettes, Smokeless Tobacco, Cigars, and/or Pipes): No  Has this patient used any form of tobacco in the last 30 days? (Cigarettes, Smokeless Tobacco, Cigars, and/or Pipes) No  Mental Status Per Nursing Assessment::    On Admission:     Current Mental Status by Physician: In full contact with reality. There are no active SI plans or intent. States she feels down because she is in pain after she fell. ( she has some scratches in her back) states she feels safe to be D/C home. She will go back to work on Wednesday. She plans to call her MD and make an appointment to be checked before she goes back to work.    Loss Factors: NA  Historical Factors: NA  Risk Reduction Factors:   Sense of responsibility to family, Employed, Living with another person, especially a relative and Positive social support  Continued Clinical Symptoms:  Bipolar Disorder:   Depressive phase  Cognitive Features That Contribute To Risk:  Closed-mindedness, Polarized thinking and Thought constriction (tunnel vision)    Suicide Risk:  Minimal: No identifiable suicidal ideation.  Patients presenting with no risk factors but with morbid ruminations; may be classified as minimal risk based on the severity of the depressive symptoms  Principal Problem: <principal problem not specified> Discharge Diagnoses:  Patient Active Problem List   Diagnosis Date Noted  . Bipolar I disorder, most recent episode depressed [F31.30]   . MDD (major depressive disorder), recurrent, severe, with psychosis [F33.3] 09/23/2014  . Injury of left index finger [S69.92XA] 08/20/2014  . Right ankle sprain [S93.401A] 06/01/2014  . Contusion, multiple sites [T14.8] 06/01/2014  . Strain of right gastrocnemius muscle [S86.811A] 06/01/2014  . Phonophobia [F40.298] 05/04/2014  . Photophobia of both eyes [H53.19] 05/04/2014  . Emotionally  unstable borderline personality disorder [F60.3] 05/04/2014  . Anxiety state [F41.1] 05/04/2014  . Nausea with vomiting [R11.2] 05/04/2014  . Mixed bipolar I disorder [F31.60]   . Bipolar I disorder, most recent episode mixed [F31.60] 04/18/2014  . Bipolar affective disorder, depressed, severe [F31.4] 04/12/2014  . Suicidal  ideation [R45.851] 04/12/2014  . Injury of right shoulder and upper arm [S49.91XA] 02/17/2014  . Left leg pain [M79.605] 06/03/2013  . Right hip pain [M25.551] 06/03/2013  . Migraine with status migrainosus [G43.901] 01/08/2013  . Personality disorder [F60.9]   . Chronic migraine [G43.709] 05/08/2012  . Contact dermatitis [L25.9] 11/27/2011  . Major depressive disorder, recurrent episode [F33.9] 10/27/2011  . Generalized anxiety disorder [F41.1] 10/27/2011  . 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 Borderline personality disorder [F60.3] 10/27/2011  . Right foot pain [M79.671] 09/28/2011  . 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 Ganglion cyst [M67.40] 09/29/2009  . 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 BIPOLAR DISORDER UNSPECIFIED [F31.9] 06/09/2008    Follow-up Information    Follow up with [facility].   Why:  Medication management appt on Wednesday Sept. 7th at 1pm with Pauline Good, NP & on Monday Sept. 19th at 10:15am with Dr. Caprice Beaver.    Contact information:   [address] [place], [place] [address] Phone: [phone]                 Follow up with [facility] On 09/30/2014.   Why:  Therapy appointment with Ruben Reason on Wednesday August 31st at 12pm. Please call office if you need to reschedule.   Contact information:   [address] [place], [place], [place] [address]  [phone]      Plan Of Care/Follow-up recommendations:  Activity:  as tolerated Diet:  regular Follow up as above with Dr. Lolita Patella Follow up with your MD concerning your back Is patient on multiple antipsychotic therapies at discharge:  No   Has Patient had three or more failed trials of antipsychotic monotherapy by history:  No  Recommended Plan for Multiple Antipsychotic Therapies: NA    Rocket Gunderson A 09/28/2014, 12:34 PM

## 2014-09-28 NOTE — Progress Notes (Signed)
D:Pt reports passive si thoughts with no specific plan. Pt is anxious about possible d/c today. She is attending groups and interacts with staff and peers on the unit. Pt c/o back pain and was given prn medication early this morning. A:Offered support and 15 minute checks. R:Safety maintained on the unit.

## 2014-09-28 NOTE — Discharge Summary (Signed)
Physician Discharge Summary Note  Patient:  [name] is an 44 y.o., female MRN:  [id number] DOB:  1970/09/30 Patient phone:  [phone] (home)  Patient address:   [place] [address],  Total Time spent with patient: Greater than 30 minutes  Date of Admission:  09/23/2014  Date of Discharge: 09-28-14  Reason for Admission:  Worsening symptoms of Bipolar disorder  Principal Problem: <principal problem not specified>  Discharge Diagnoses: Patient Active Problem List   Diagnosis Date Noted  . Bipolar I disorder, most recent episode depressed [F31.30]   . MDD (major depressive disorder), recurrent, severe, with psychosis [F33.3] 09/23/2014  . Injury of left index finger [S69.92XA] 08/20/2014  . Right ankle sprain [S93.401A] 06/01/2014  . Contusion, multiple sites [T14.8] 06/01/2014  . Strain of right gastrocnemius muscle [S86.811A] 06/01/2014  . Phonophobia [F40.298] 05/04/2014  . Photophobia of both eyes [H53.19] 05/04/2014  . 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 Anxiety state [F41.1] 05/04/2014  . Nausea with vomiting [R11.2] 05/04/2014  . Mixed bipolar I disorder [F31.60]   . Bipolar I disorder, most recent episode mixed [F31.60] 04/18/2014  . Bipolar affective disorder, depressed, severe [F31.4] 04/12/2014  . Suicidal ideation [R45.851] 04/12/2014  . Injury of right shoulder and upper arm [S49.91XA] 02/17/2014  . Left leg pain [M79.605] 06/03/2013  . Right hip pain [M25.551] 06/03/2013  . Migraine with status migrainosus [G43.901] 01/08/2013  . Personality disorder [F60.9]   . Chronic migraine [G43.709] 05/08/2012  . Contact dermatitis [L25.9] 11/27/2011  . Major depressive disorder, recurrent episode [F33.9] 10/27/2011  . Generalized anxiety disorder [F41.1] 10/27/2011  . 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 Borderline personality disorder [F60.3] 10/27/2011   . Right foot pain [M79.671] 09/28/2011  . 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 Ganglion cyst [M67.40] 09/29/2009  . 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 BIPOLAR DISORDER UNSPECIFIED [F31.9] 06/09/2008   Musculoskeletal: Strength & Muscle Tone: within normal limits Gait & Station: normal Patient leans: N/A  Psychiatric Specialty Exam: Physical Exam  Psychiatric: Her speech is normal and behavior is normal. Judgment and thought content normal. Her mood appears not anxious. Her affect is not angry, not blunt, not labile and not inappropriate. Cognition and memory are normal. She does not exhibit a depressed mood.    Review of Systems  Constitutional: Negative.   HENT: Negative.   Eyes: Negative.   Respiratory: Negative.   Cardiovascular: Negative.   Gastrointestinal: Negative.   Genitourinary: Negative.   Musculoskeletal: Negative.   Skin: Negative.   Neurological: Negative.   Endo/Heme/Allergies: Negative.   Psychiatric/Behavioral: Positive for depression (Stable). Negative for suicidal ideas, hallucinations, memory loss and substance abuse. The patient has insomnia (Stable). The patient is not nervous/anxious.     Blood pressure 114/73, pulse 72, temperature 97.6 F (36.4 C), temperature source Oral, resp. rate 18, height 5\' 9"  (1.753 m), weight 109.317 kg (241 lb), SpO2 98 %.Body mass index is 35.57 kg/(m^2).  See Md's SRA   Have you used any form of tobacco in the last 30 days? (Cigarettes, Smokeless Tobacco, Cigars, and/or Pipes): No  Has this patient used any form of tobacco in the last 30 days? (Cigarettes, Smokeless Tobacco, Cigars, and/or Pipes) No  Past Medical History:  Past Medical History  Diagnosis Date  . Bipolar disorder   . Eczema   . Pes planus   . Achilles tendinitis   . Hypertension   .  Migraine   . Achilles tendinitis   . Ganglion cyst 09/29/2009    left wrist (2 cyst)  . Arthritis    . Allergy   . Lipoma   . ADHD (attention deficit hyperactivity disorder)   . Hyperprolactinemia   . Bipolar affective   . Personality disorder     Past Surgical History  Procedure Laterality Date  . Tumor resection left thigh    . Ankle surgery  12/88    left   . Lipoma removal    . Ganglion cyst excision  2011  . Chest nodule  1990?    rt chest wall nodule removal  . Shoulder surgery  01/13/2011    right, partial tear  . Right bunioectomy     Family History:  Family History  Problem Relation Age of Onset  . Hypertension Mother   . Hyperlipidemia Mother   . Heart attack Father   . Heart disease Father   . Hypertension Father   . Bipolar disorder Father   . Diabetes Paternal Grandfather   . Heart disease Maternal Aunt   . Breast cancer Maternal Aunt   . Heart disease Maternal Grandmother   . Cancer Maternal Grandmother     colon   Social History:  History  Alcohol Use No     History  Drug Use No    Social History   Social History  . Marital Status: Single    Spouse Name: N/A  . Number of Children: 0  . Years of Education: N/A   Occupational History  .  [employer]   Social History Main Topics  . Smoking status: Never Smoker   . Smokeless tobacco: Never Used  . Alcohol Use: No  . Drug Use: No  . Sexual Activity: No   Other Topics Concern  . None   Social History Narrative   Caffeine  2 sodas daily, 1 cup coffee daily.   Risk to Self: Suicidal Ideation: Yes-Currently Present Suicidal Intent: Yes-Currently Present Is patient at risk for suicide?: Yes Suicidal Plan?: Yes-Currently Present Specify Current Suicidal Plan: Patient stated that she was going to run her car into a tree Access to Means: Yes Specify Access to Suicidal Means: Patient states she has access to car and intends on crashing it. What has been your use of drugs/alcohol within the last 12 months?: Denies How many times?: 3 Other Self Harm Risks: Patient states she has been  cutting Triggers for Past Attempts: Family contact Intentional Self Injurious Behavior: Cutting Comment - Self Injurious Behavior: Patient stated she cut her upper right thigh with a knife on 8/20  Risk to Others: Homicidal Ideation: No Thoughts of Harm to Others: No Current Homicidal Intent: No Current Homicidal Plan: No Access to Homicidal Means: No Identified Victim: NA History of harm to others?: No Assessment of Violence: None Noted Violent Behavior Description: NA Does patient have access to weapons?: Yes (Comment) (Patient states she access to various knives) Criminal Charges Pending?: No Does patient have a court date: No Prior Inpatient Therapy: Prior Inpatient Therapy: Yes (Patient was at [facility] in 2016 March) Prior Therapy Dates: May 2016 [facility] Prior Therapy Facilty/Provider(s): [facility] 2013 Reason for Treatment: SI with plan Prior Outpatient Therapy: Prior Outpatient Therapy: Yes Prior Therapy Dates: Current Prior Therapy Facilty/Provider(s): Ruben Reason LCSW Reason for Treatment: MH issues Does patient have an ACCT team?: No Does patient have Intensive In-House Services?  : No Does patient have [facility] services? : No Does patient have P4CC services?: No  Level of Care:  OP  Hospital Course:  44 Year old single female, known to our unit from prior admissions, has been diagnosed with Bipolar Disorder, Borderline Personality Disorder. States that over the last month and a half she has been feeling more easily overwhelmed, more irritable, and has developed thoughts of self cutting, with recent episode of cutting on her legs ( superficial, no sutures ), after a period of several years where she did not cut.Over recent weeks has been having increased suicidal ideations, but has not attempted suicide. States " I felt I could keep.The thoughtsat bay for a little bit, but I felt like I needed to come into the hospital for help". States she has been feeling more distractible, more " in  my own head", and has been feeling guilty, depressed, upset. Describes symptoms of depression such as anhedonia, sadness, low energy level.  Chauntelle was assessed & evaluated for her worsening depression & suicidal ideations upon admission. Her presenting symptoms were identified. Medication management targeting those symptoms were discussed & started. She was medicated & discharged on; Depakote ER 1500 mg for mood stabilization, Lamictal 200 mg for mood stabilization, Latuda 80 mg for mood control, Hydroxyzine 50 mg for anxiety, Mirtazapine 15 mg for depression/insomnia & Ambien 10 mg for insomnia. Vianny was also enrolled & participated in the group counseling sessions being offered & held on this unit. She learned coping skills that should help her cope better & manage her symptoms after discharge. She was resumed on all pertinent home medications for all her pre-existing medical issues presented. Aleynah tolerated her treatment regimen without any adverse effects or reactions. During her stay in this hospital, Cameo reported to staff that she fell in the bathroom while trying to use the commode. She stated that she missed the commode, fell, sustained an abrasion to her back areas. Denies any serious injuries. She was treated for muscle pain using Robaxin 500 mg.  Ayse's symptoms responded well to her treatment regimen. This is evidenced by her reports of improved mood & absence of suicidal ideations. She is currently being discharged to her home to follow-up care as noted below. Upon discharge, Tajanae is in full contact with reality. There are no active SI plans or intent. States she feels down because she is in pain after she fell. ( she has some scratches in her back) states she feels safe to be D/C home. She will go back to work on Wednesday. She plans to call her MD and make an appointment to be checked before she goes back to work. Abcde left [facility] in no apparent distress. She was provided with some  Robaxin 500 mg prescription for her muscle pain to her back. Transportation per her arrangement.  Consults:  psychiatry  Significant Diagnostic Studies:  labs: CBC with diff, CMP, UDS, toxicology tests, U/A, results reviewed, stable  Discharge Vitals:   Blood pressure 114/73, pulse 72, temperature 97.6 F (36.4 C), temperature source Oral, resp. rate 18, height 5\' 9"  (1.753 m), weight 109.317 kg (241 lb), SpO2 98 %. Body mass index is 35.57 kg/(m^2). Lab Results:   No results found for this or any previous visit (from the past 72 hour(s)).  Physical Findings: AIMS: Facial and Oral Movements Muscles of Facial Expression: None, normal Lips and Perioral Area: None, normal Jaw: None, normal Tongue: None, normal,Extremity Movements Upper (arms, wrists, hands, fingers): None, normal Lower (legs, knees, ankles, toes): None, normal, Trunk Movements Neck, shoulders, hips: None, normal, Overall Severity Severity of abnormal  movements (highest score from questions above): None, normal Incapacitation due to abnormal movements: None, normal Patient's awareness of abnormal movements (rate only patient's report): No Awareness, Dental Status Current problems with teeth and/or dentures?: No Does patient usually wear dentures?: No  CIWA:    COWS:      See Psychiatric Specialty Exam and Suicide Risk Assessment completed by Attending Physician prior to discharge.  Discharge destination:  Home  Is patient on multiple antipsychotic therapies at discharge:  No   Has Patient had three or more failed trials of antipsychotic monotherapy by history:  No  Recommended Plan for Multiple Antipsychotic Therapies: NA    Medication List    STOP taking these medications        CALCIUM 600+D3 600-400 MG-UNIT Tabs  Generic drug:  Calcium Carb-Cholecalciferol     cetirizine 10 MG tablet  Commonly known as:  ZYRTEC     cholecalciferol 1000 UNITS tablet  Commonly known as:  VITAMIN D     clobetasol  cream 0.05 %  Commonly known as:  TEMOVATE     diclofenac 75 MG EC tablet  Commonly known as:  VOLTAREN     hydrOXYzine 50 MG capsule  Commonly known as:  VISTARIL     naftifine 1 % cream  Commonly known as:  NAFTIN     promethazine 25 MG tablet  Commonly known as:  PHENERGAN     sertraline 50 MG tablet  Commonly known as:  ZOLOFT     sodium chloride 0.9 % SOLN 100 mL with valproate 500 MG/5ML SOLN     VITAMIN B-12 PO     vitamin C 500 MG tablet  Commonly known as:  ASCORBIC ACID     zinc gluconate 50 MG tablet      TAKE these medications      Indication   divalproex 500 MG 24 hr tablet  Commonly known as:  DEPAKOTE ER  Take 3 tablets (1,500 mg total) by mouth at bedtime. For mood stabilization   Indication:  Mood stabilization     hydrochlorothiazide 25 MG tablet  Commonly known as:  HYDRODIURIL  Take 1 tablet (25 mg total) by mouth daily. For blood pressure control.   Indication:  High Blood Pressure     hydrOXYzine 50 MG tablet  Commonly known as:  ATARAX/VISTARIL  Take 1 tablet (50 mg total) by mouth 2 (two) times daily as needed for anxiety (insomnia).   Indication:  Sedation, Anxiety     lamoTRIgine 200 MG tablet  Commonly known as:  LAMICTAL  Take 1 tablet (200 mg total) by mouth at bedtime. For mood stabilization   Indication:  Manic-Depression, Mood stabilization     lurasidone 80 MG Tabs tablet  Commonly known as:  LATUDA  Take 1 tablet (80 mg total) by mouth daily at 6 PM. For mood control   Indication:  Depressive Phase of Manic-Depression, Mood control     methocarbamol 500 MG tablet  Commonly known as:  ROBAXIN  Take 1 tablet (500 mg total) by mouth every 6 (six) hours as needed for muscle spasms.   Indication:  Musculoskeletal Pain     metoprolol succinate 100 MG 24 hr tablet  Commonly known as:  TOPROL-XL  Take 1 tablet (100 mg total) by mouth daily. Take with or immediately following a meal for blood pressure control.   Indication:  High  Blood Pressure     mirtazapine 15 MG tablet  Commonly known as:  REMERON  Take 1  tablet (15 mg total) by mouth at bedtime. For depression/insomnia   Indication:  Trouble Sleeping, Major Depressive Disorder     multivitamin with minerals Tabs tablet  Take 1 tablet by mouth daily. For nutritional supplementation.   Indication:  Nutritional Support     SUMAtriptan 6 MG/0.5ML Soln injection  Commonly known as:  IMITREX  Inject 0.5 mLs (6 mg total) into the skin every 2 (two) hours as needed for migraine or headache. May repeat in 2 hours if headache persists or recurs.   Indication:  Migraine Headache     zolpidem 10 MG tablet  Commonly known as:  AMBIEN  Take 1 tablet (10 mg total) by mouth at bedtime as needed for sleep.   Indication:  Trouble Sleeping       Follow-up Information    Follow up with [facility].   Why:  Medication management appt on Wednesday Sept. 7th at 1pm with Pauline Good, NP & on Monday Sept. 19th at 10:15am with Dr. Caprice Beaver.    Contact information:   [address] [place], [place] [address] Phone: [phone]                 Follow up with [facility] On 09/30/2014.   Why:  Therapy appointment with Ruben Reason on Wednesday August 31st at 12pm. Please call office if you need to reschedule.   Contact information:   [address] [place], [place], [place] [address]  [phone]     Follow-up recommendations: Activity:  As tolerated Diet: As recommended by your primary care doctor. Keep all scheduled follow-up appointments as recommended.    Comments:  Take all your medications as prescribed by your mental healthcare provider. Report any adverse effects and or reactions from your medicines to your outpatient provider promptly. Patient is instructed and cautioned to not engage in alcohol and or illegal drug use while on prescription medicines. In the event of worsening symptoms, patient is instructed to call the  crisis hotline, 911 and or go to the nearest ED for appropriate evaluation and treatment of symptoms. Follow-up with your primary care provider for your other medical issues, concerns and or health care needs.    Total Discharge Time: Greater than 30 minutes  Signed: Encarnacion Slates, PMHNP-BC 09/28/2014, 11:07 AM  I personally assessed the patient and formulated the plan Geralyn Flash A. [name] Heck, M.D.

## 2014-09-28 NOTE — Plan of Care (Signed)
Problem: Alteration in mood Goal: STG-Patient is able to discuss feelings and issues (Patient is able to discuss feelings and issues leading to depression)  Outcome: Progressing Pt is attending groups talking with staff and peers.

## 2014-09-29 ENCOUNTER — Ambulatory Visit (INDEPENDENT_AMBULATORY_CARE_PROVIDER_SITE_OTHER): Payer: PPO | Admitting: Family Medicine

## 2014-09-29 ENCOUNTER — Other Ambulatory Visit: Payer: Self-pay | Admitting: *Deleted

## 2014-09-29 ENCOUNTER — Ambulatory Visit (HOSPITAL_BASED_OUTPATIENT_CLINIC_OR_DEPARTMENT_OTHER)
Admission: RE | Admit: 2014-09-29 | Discharge: 2014-09-29 | Disposition: A | Discharge status: Home / Self Care | Payer: PPO | Source: Ambulatory Visit | Attending: Family Medicine | Admitting: Family Medicine

## 2014-09-29 ENCOUNTER — Encounter: Payer: Self-pay | Admitting: Family Medicine

## 2014-09-29 VITALS — BP 132/58 | HR 91 | Ht 70.0 in | Wt 241.0 lb

## 2014-09-29 DIAGNOSIS — M4186 Other forms of scoliosis, lumbar region: Secondary | ICD-10-CM | POA: Insufficient documentation

## 2014-09-29 DIAGNOSIS — S3992XA Unspecified injury of lower back, initial encounter: Secondary | ICD-10-CM | POA: Diagnosis not present

## 2014-09-29 DIAGNOSIS — M545 Low back pain: Secondary | ICD-10-CM | POA: Insufficient documentation

## 2014-09-29 MED ORDER — IBUPROFEN 800 MG PO TABS: 800.0000 mg | ORAL_TABLET | Freq: Three times a day (TID) | ORAL | Status: DC | PRN

## 2014-09-29 NOTE — Patient Instructions (Signed)
Your x-rays are normal. You have a lumbar strain and contusion. Take robaxin as needed for spasms. Ibuprofen 800mg  three times a day with food for pain and inflammation. If over next couple weeks you're not improving as expected call me and we can add physical therapy for this. Heat as needed for spasms 15 minutes at a time. Follow up with me in 1 month otherwise.

## 2014-09-29 NOTE — Patient Outreach (Signed)
[facility]) Care Management  09/29/2014  Quynn Vilchis [place] 08/27/1970 [id number]   Assessment: Patient Outreach Call- first attempt Call placed to patient using two telephone numbers provided but unable to reach her. One phone number has been disconnected. HIPAA compliant message left with care management coordinator's name and contact information on patient's working phone number.  Plan: Will await for return call. If unable to receive return call, will call again tomorrow 09/30/14.  Saleh Ulbrich A. Miski Feldpausch, BSN, RN-BC [place] Management Coordinator Cell: [phone]

## 2014-09-29 NOTE — Patient Outreach (Signed)
[facility]) Care Management  09/29/2014  [name] July 04, 1970 [id number]   Referral from HTA tier 4 list, reassigned to Diamantina Monks, [facility] for patient outreach.  [name] [name], [place] Care Management Assistant

## 2014-09-30 ENCOUNTER — Other Ambulatory Visit: Payer: Self-pay | Admitting: *Deleted

## 2014-09-30 DIAGNOSIS — S3992XA Unspecified injury of lower back, initial encounter: Secondary | ICD-10-CM | POA: Insufficient documentation

## 2014-09-30 NOTE — Patient Outreach (Signed)
[facility]) Care Management  09/30/2014  [name] 10-22-1970 [id number]   Assessment: Patient Outreach Call- second attempt Unable to receive call back from patient in response to message left. Call placed to continue to reach patient but no answer. HIPAA compliant voice message left with name and contact number.  Plan: Will await for reply. If unable to receive a call back, will call again tomorrow to try to reach patient.  [name], BSN, RN-BC [place] Management Coordinator Cell: [phone]

## 2014-09-30 NOTE — Assessment & Plan Note (Signed)
2/2 contusion and lumbar strain.  Radiographs negative for fracture.  Patient reassured.  Robaxin, ibuprofen as needed.  Consider physical therapy if not improving over the next couple weeks.  Heat for spasms.  F/u in 1 month.

## 2014-09-30 NOTE — Progress Notes (Signed)
PCP: [name], MD  Subjective:   HPI: Patient is a 44 y.o. female here for back pain.  Patient reports on 8/26 she fell in the bathroom and scraped posterior low back against front of the toilet. + bruising and localized swelling. Pain level 6/10. Pain worse with twisting, bending forward. No history of osteoporosis. Taking tylenol, using ice pack initially - now taking robaxin and ibuprofen. No radiation into extremities. No numbness/tingling. No bowel/bladder dysfunction.  Past Medical History  Diagnosis Date  . Bipolar disorder   . Eczema   . Pes planus   . Achilles tendinitis   . Hypertension   . Migraine   . Achilles tendinitis   . Ganglion cyst 09/29/2009    left wrist (2 cyst)  . Arthritis   . Allergy   . Lipoma   . ADHD (attention deficit hyperactivity disorder)   . Hyperprolactinemia   . Bipolar affective   . Personality disorder     Current Outpatient Prescriptions on File Prior to Visit  Medication Sig Dispense Refill  . divalproex (DEPAKOTE ER) 500 MG 24 hr tablet Take 3 tablets (1,500 mg total) by mouth at bedtime. For mood stabilization 90 tablet 0  . hydrochlorothiazide (HYDRODIURIL) 25 MG tablet Take 1 tablet (25 mg total) by mouth daily. For blood pressure control. 30 tablet 0  . hydrOXYzine (ATARAX/VISTARIL) 50 MG tablet Take 1 tablet (50 mg total) by mouth 2 (two) times daily as needed for anxiety (insomnia). 45 tablet 0  . lamoTRIgine (LAMICTAL) 200 MG tablet Take 1 tablet (200 mg total) by mouth at bedtime. For mood stabilization 30 tablet 0  . lurasidone (LATUDA) 80 MG TABS tablet Take 1 tablet (80 mg total) by mouth daily at 6 PM. For mood control 30 tablet 0  . methocarbamol (ROBAXIN) 500 MG tablet Take 1 tablet (500 mg total) by mouth every 6 (six) hours as needed for muscle spasms. 12 tablet 1  . metoprolol succinate (TOPROL-XL) 100 MG 24 hr tablet Take 1 tablet (100 mg total) by mouth daily. Take with or immediately following a meal for  blood pressure control. 30 tablet 0  . mirtazapine (REMERON) 15 MG tablet Take 1 tablet (15 mg total) by mouth at bedtime. For depression/insomnia 30 tablet 0  . Multiple Vitamin (MULTIVITAMIN WITH MINERALS) TABS tablet Take 1 tablet by mouth daily. For nutritional supplementation. 30 tablet 0  . SUMAtriptan (IMITREX) 6 MG/0.5ML SOLN injection Inject 0.5 mLs (6 mg total) into the skin every 2 (two) hours as needed for migraine or headache. May repeat in 2 hours if headache persists or recurs.  0  . zolpidem (AMBIEN) 10 MG tablet Take 1 tablet (10 mg total) by mouth at bedtime as needed for sleep. 7 tablet 0   No current facility-administered medications on file prior to visit.    Past Surgical History  Procedure Laterality Date  . Tumor resection left thigh    . Ankle surgery  12/88    left   . Lipoma removal    . Ganglion cyst excision  2011  . Chest nodule  1990?    rt chest wall nodule removal  . Shoulder surgery  01/13/2011    right, partial tear  . Right bunioectomy      Allergies  Allergen Reactions  . Adhesive [Tape] Itching and Rash    Also reacted to Steri Strips and Band-Aids.  . Dilaudid [Hydromorphone Hcl] Itching  . Morphine Nausea And Vomiting  . Penicillins Hives  . Percocet [Oxycodone-Acetaminophen] Itching  .  Prednisone Hives  . Provera [Medroxyprogesterone Acetate] Other (See Comments)    Causes manic episodes  . Ultram [Tramadol Hcl] Itching    Social History   Social History  . Marital Status: Single    Spouse Name: N/A  . Number of Children: 0  . Years of Education: N/A   Occupational History  .  [employer]   Social History Main Topics  . Smoking status: Never Smoker   . Smokeless tobacco: Never Used  . Alcohol Use: No  . Drug Use: No  . Sexual Activity: No   Other Topics Concern  . Not on file   Social History Narrative   Caffeine  2 sodas daily, 1 cup coffee daily.    Family History  Problem Relation Age of Onset  . Hypertension Mother    . Hyperlipidemia Mother   . Heart attack Father   . Heart disease Father   . Hypertension Father   . Bipolar disorder Father   . Diabetes Paternal Grandfather   . Heart disease Maternal Aunt   . Breast cancer Maternal Aunt   . Heart disease Maternal Grandmother   . Cancer Maternal Grandmother     colon    BP 132/58 mmHg  Pulse 91  Ht 5\' 10"  (1.778 m)  Wt 241 lb (109.317 kg)  BMI 34.58 kg/m2  Review of Systems: See HPI above.    Objective:  Physical Exam:  Gen: NAD  Back: Bruising and mild swelling mid-lumbar area just right of midline. TTP R > L paraspinal regions.  No focal bony tenderness or stepoffs. ROM 10 degrees extension, 70 degrees flexion.  Pain with flexion and bilateral rotations. Strength LEs 5/5 all muscle groups.   2+ MSRs in patellar and achilles tendons, equal bilaterally. Negative SLRs. Sensation intact to light touch bilaterally. Negative logroll bilateral hips    Assessment & Plan:  1. Low back injury - 2/2 contusion and lumbar strain.  Radiographs negative for fracture.  Patient reassured.  Robaxin, ibuprofen as needed.  Consider physical therapy if not improving over the next couple weeks.  Heat for spasms.  F/u in 1 month.

## 2014-10-01 ENCOUNTER — Other Ambulatory Visit: Payer: Self-pay | Admitting: *Deleted

## 2014-10-01 NOTE — Patient Outreach (Signed)
[facility]) Care Management  10/01/2014  [name] April 09, 1970 [id number]   Assessment: Patient outreach call- third attempt Call placed to patient but unable to reach her on both phone numbers provided. HIPAA compliant voice message left with name and contact information.  Plan: Will await for return call. If unable to receive a call back, will send a Patient Outreach Letter and wait for response. If no response to attempts to reach patient, will plan to close case and notify primary care provider.  Camia Dipinto A. Eberardo Demello, BSN, RN-BC [place] Management Coordinator Cell: [phone]

## 2014-10-20 ENCOUNTER — Other Ambulatory Visit: Payer: Self-pay | Admitting: *Deleted

## 2014-10-20 ENCOUNTER — Encounter: Payer: Self-pay | Admitting: *Deleted

## 2014-10-20 NOTE — Patient Outreach (Signed)
[facility]) Care Management  10/20/2014  [name] [birth year] [id number]   Assessment: Telephone screen Patient has not responded to the Patient Outreach Letter sent to her.   No return calls received from patient to any messages left as well.    Plan: Will close case. Will notify primary care provider of case closure.  [name] A. Ajel, BSN, RN-BC [place] Management Coordinator Cell: [phone]

## 2014-10-28 NOTE — Patient Outreach (Signed)
[facility] [facility]) Care Management  10/28/2014  [name] Nov 17, 1970 [id number]   Notification from Dannielle Huh, RN to close case due to unable to contact patient for [place] Management services.  Thanks, Ronnell Freshwater. [place], [place] Assistant Phone: [phone] Fax: [phone]

## 2014-10-30 ENCOUNTER — Encounter: Payer: Self-pay | Admitting: Family Medicine

## 2014-10-30 ENCOUNTER — Ambulatory Visit (INDEPENDENT_AMBULATORY_CARE_PROVIDER_SITE_OTHER): Payer: PPO | Admitting: Family Medicine

## 2014-10-30 VITALS — BP 116/82 | HR 174 | Ht 70.0 in | Wt 249.0 lb

## 2014-10-30 DIAGNOSIS — S39012A Strain of muscle, fascia and tendon of lower back, initial encounter: Secondary | ICD-10-CM | POA: Diagnosis not present

## 2014-10-30 DIAGNOSIS — S3992XD Unspecified injury of lower back, subsequent encounter: Secondary | ICD-10-CM | POA: Diagnosis not present

## 2014-10-30 NOTE — Patient Instructions (Signed)
You have a lumbar strain and contusion. Start physical therapy and do home exercises on days you don't go to therapy. Robaxin and ibuprofen only as needed. If over next couple weeks you're not improving as expected call me and we can add physical therapy for this. Heat as needed for spasms 15 minutes at a time. Follow up with me in 5-6 weeks.

## 2014-10-30 NOTE — Progress Notes (Signed)
PCP: [name], MD  Subjective:   HPI: Patient is a 44 y.o. female here for back pain.  8/30: Patient reports on 8/26 she fell in the bathroom and scraped posterior low back against front of the toilet. + bruising and localized swelling. Pain level 6/10. Pain worse with twisting, bending forward. No history of osteoporosis. Taking tylenol, using ice pack initially - now taking robaxin and ibuprofen. No radiation into extremities. No numbness/tingling. No bowel/bladder dysfunction.  9/30: Patient reports she is not improving. Has had good and bad days. Pain level 6/10. Felt more on the left side of lumbar spine. Described as a soreness worse with bending, twisting. No swelling.  Bruising has improved. No radiation. No numbness/tingling. No bowel/bladder dysfunction. Taking robaxin and ibuprofen only as needed. Has been using heat.  Past Medical History  Diagnosis Date  . Bipolar disorder   . Eczema   . Pes planus   . Achilles tendinitis   . Hypertension   . Migraine   . Achilles tendinitis   . Ganglion cyst 09/29/2009    left wrist (2 cyst)  . Arthritis   . Allergy   . Lipoma   . ADHD (attention deficit hyperactivity disorder)   . Hyperprolactinemia   . Bipolar affective   . Personality disorder     Current Outpatient Prescriptions on File Prior to Visit  Medication Sig Dispense Refill  . divalproex (DEPAKOTE ER) 500 MG 24 hr tablet Take 3 tablets (1,500 mg total) by mouth at bedtime. For mood stabilization 90 tablet 0  . hydrochlorothiazide (HYDRODIURIL) 25 MG tablet Take 1 tablet (25 mg total) by mouth daily. For blood pressure control. 30 tablet 0  . hydrOXYzine (ATARAX/VISTARIL) 50 MG tablet Take 1 tablet (50 mg total) by mouth 2 (two) times daily as needed for anxiety (insomnia). 45 tablet 0  . ibuprofen (ADVIL,MOTRIN) 800 MG tablet Take 1 tablet (800 mg total) by mouth every 8 (eight) hours as needed. 90 tablet 0  . lamoTRIgine (LAMICTAL) 200 MG  tablet Take 1 tablet (200 mg total) by mouth at bedtime. For mood stabilization 30 tablet 0  . lurasidone (LATUDA) 80 MG TABS tablet Take 1 tablet (80 mg total) by mouth daily at 6 PM. For mood control 30 tablet 0  . methocarbamol (ROBAXIN) 500 MG tablet Take 1 tablet (500 mg total) by mouth every 6 (six) hours as needed for muscle spasms. 12 tablet 1  . metoprolol succinate (TOPROL-XL) 100 MG 24 hr tablet Take 1 tablet (100 mg total) by mouth daily. Take with or immediately following a meal for blood pressure control. 30 tablet 0  . Multiple Vitamin (MULTIVITAMIN WITH MINERALS) TABS tablet Take 1 tablet by mouth daily. For nutritional supplementation. 30 tablet 0  . SUMAtriptan (IMITREX) 6 MG/0.5ML SOLN injection Inject 0.5 mLs (6 mg total) into the skin every 2 (two) hours as needed for migraine or headache. May repeat in 2 hours if headache persists or recurs.  0   No current facility-administered medications on file prior to visit.    Past Surgical History  Procedure Laterality Date  . Tumor resection left thigh    . Ankle surgery  12/88    left   . Lipoma removal    . Ganglion cyst excision  2011  . Chest nodule  1990?    rt chest wall nodule removal  . Shoulder surgery  01/13/2011    right, partial tear  . Right bunioectomy      Allergies  Allergen Reactions  .  Adhesive [Tape] Itching and Rash    Also reacted to Steri Strips and Band-Aids.  . Dilaudid [Hydromorphone Hcl] Itching  . Morphine Nausea And Vomiting  . Penicillins Hives  . Percocet [Oxycodone-Acetaminophen] Itching  . Prednisone Hives  . Provera [Medroxyprogesterone Acetate] Other (See Comments)    Causes manic episodes  . Ultram [Tramadol Hcl] Itching    Social History   Social History  . Marital Status: Single    Spouse Name: N/A  . Number of Children: 0  . Years of Education: N/A   Occupational History  .  [employer]   Social History Main Topics  . Smoking status: Never Smoker   . Smokeless tobacco:  Never Used  . Alcohol Use: No  . Drug Use: No  . Sexual Activity: No   Other Topics Concern  . Not on file   Social History Narrative   Caffeine  2 sodas daily, 1 cup coffee daily.    Family History  Problem Relation Age of Onset  . Hypertension Mother   . Hyperlipidemia Mother   . Heart attack Father   . Heart disease Father   . Hypertension Father   . Bipolar disorder Father   . Diabetes Paternal Grandfather   . Heart disease Maternal Aunt   . Breast cancer Maternal Aunt   . Heart disease Maternal Grandmother   . Cancer Maternal Grandmother     colon    BP 116/82 mmHg  Pulse 174  Ht 5\' 10"  (1.778 m)  Wt 249 lb (112.946 kg)  BMI 35.73 kg/m2  Review of Systems: See HPI above.    Objective:  Physical Exam:  Gen: NAD  Back: No bruising, swelling. TTP L > R paraspinal regions.  No focal bony tenderness or stepoffs. ROM 10 degrees extension, 70 degrees flexion.  Pain with flexion and bilateral rotations. Strength LEs 5/5 all muscle groups.   2+ MSRs in patellar and achilles tendons, equal bilaterally. Negative SLRs. Sensation intact to light touch bilaterally.  Bilateral hips: FROM without pain or weakness.    Assessment & Plan:  1. Low back injury - 2/2 contusion and lumbar strain.  Radiographs negative for fracture.  Unfortunately not improving.  Will start physical therapy.  Robaxin and ibuprofen as needed.  Heat for spasms.  F/u in 5-6 weeks.

## 2014-10-30 NOTE — Assessment & Plan Note (Signed)
2/2 contusion and lumbar strain.  Radiographs negative for fracture.  Unfortunately not improving.  Will start physical therapy.  Robaxin and ibuprofen as needed.  Heat for spasms.  F/u in 5-6 weeks.

## 2014-11-03 ENCOUNTER — Ambulatory Visit (INDEPENDENT_AMBULATORY_CARE_PROVIDER_SITE_OTHER): Payer: PPO | Admitting: Adult Health

## 2014-11-03 ENCOUNTER — Encounter: Payer: Self-pay | Admitting: Adult Health

## 2014-11-03 VITALS — BP 110/78 | HR 74 | Ht 70.0 in | Wt 251.8 lb

## 2014-11-03 DIAGNOSIS — G43019 Migraine without aura, intractable, without status migrainosus: Secondary | ICD-10-CM

## 2014-11-03 DIAGNOSIS — R258 Other abnormal involuntary movements: Secondary | ICD-10-CM

## 2014-11-03 NOTE — Progress Notes (Signed)
PATIENT: [name] DOB: [birth year]  REASON FOR VISIT: follow up- migraines HISTORY FROM: patient  HISTORY OF PRESENT ILLNESS: [name] is a 44 year old female with a history of intractable migraines. She returns today for an evaluation. The patient has tried multiple medications in the past without benefit. She is currently taking Depakote to help with her mood as well as the headaches. She is also on Imitrex. She reports that she continues to have one to 2 headaches a week. She reports that some headaches are more severe than others. When she has a severe migraine it causes her to feel very clammy and nauseous. She knows that weather is a trigger for her migraines. She states that she has learned how to work through them. She states that if she catches it in time she can take an Imitrex with Phenergan and it helps to resolve the headache. In the past she has tried Topamax as as well as gabapentin and is currently on metoprolol. She states that she was on gralise but this was discontinued after her hospitalization. She reports that in March and then again in August she was hospitalized due to suicidal ideation. She has been seeing Dr. Caprice Beaver her psychiatrist monthly. She was recently placed on Remeron as well as Ambien to help with her sleep. She states that more recently she has been having leg spasms at night. She states that these are not painful but rather involuntary. She states that normally these occur right before bed and then usually will resolve. She states that she sees Dr. Caprice Beaver later this month. At that time Dr. Caprice Beaver was planning on doing lab work to evaluate for the involuntary leg movements. She returns today for an evaluation.  HISTORY [facility]): Interval history 05-04-14. [name] is here today stating that she feels currently very overwhelmed I'm not sure if the depression or her recent admission to behavior health precipitated her headaches but she presents with a  3 day duration of major migraine was photophobia, qualifying as status migrainosus. She did not discontinue any of her medications are used to prevent headaches and she continues to have access but Imitrex but it has not worked during her hospitalization in March 13 th through 25 th. She forgot she had a refill on imitrex at all.  We'll be happy to refill her Imitrex she has significant amount of nausea but so far has not vomited. I would consider her a [employer] candidate -the Imitrex patch.  However we would have to document that other triptan's have been not working for her.   Plan Imitrex ;She has subcutaneus injections, need those refilled. Phenergan . New medications and overlap reaction checked.   REVIEW OF SYSTEMS: Out of a complete 14 system review of symptoms, the patient complains only of the following symptoms, and all other reviewed systems are negative.  Appetite change, fatigue, unexpected weight change, eye itching, light sensitivity, nausea, insomnia, daytime sleepiness, back pain, agitation, nervous/anxious, dizziness, headache  ALLERGIES: Allergies  Allergen Reactions  . Adhesive [Tape] Itching and Rash    Also reacted to Steri Strips and Band-Aids.  . Dilaudid [Hydromorphone Hcl] Itching  . Morphine Nausea And Vomiting  . Penicillins Hives  . Percocet [Oxycodone-Acetaminophen] Itching  . Prednisone Hives  . Provera [Medroxyprogesterone Acetate] Other (See Comments)    Causes manic episodes  . Ultram [Tramadol Hcl] Itching    HOME MEDICATIONS: Outpatient Prescriptions Prior to Visit  Medication Sig Dispense Refill  . azelastine (ASTELIN) 0.1 % nasal  spray instill 1 to 2 sprays into each nostril twice a day  0  . divalproex (DEPAKOTE ER) 500 MG 24 hr tablet Take 3 tablets (1,500 mg total) by mouth at bedtime. For mood stabilization 90 tablet 0  . hydrochlorothiazide (HYDRODIURIL) 25 MG tablet Take 1 tablet (25 mg total) by mouth daily. For blood pressure control. 30  tablet 0  . hydrOXYzine (ATARAX/VISTARIL) 50 MG tablet Take 1 tablet (50 mg total) by mouth 2 (two) times daily as needed for anxiety (insomnia). 45 tablet 0  . ibuprofen (ADVIL,MOTRIN) 800 MG tablet Take 1 tablet (800 mg total) by mouth every 8 (eight) hours as needed. 90 tablet 0  . lamoTRIgine (LAMICTAL) 200 MG tablet Take 1 tablet (200 mg total) by mouth at bedtime. For mood stabilization 30 tablet 0  . lurasidone (LATUDA) 80 MG TABS tablet Take 1 tablet (80 mg total) by mouth daily at 6 PM. For mood control 30 tablet 0  . methocarbamol (ROBAXIN) 500 MG tablet Take 1 tablet (500 mg total) by mouth every 6 (six) hours as needed for muscle spasms. 12 tablet 1  . metoprolol succinate (TOPROL-XL) 100 MG 24 hr tablet Take 1 tablet (100 mg total) by mouth daily. Take with or immediately following a meal for blood pressure control. 30 tablet 0  . mirtazapine (REMERON) 30 MG tablet   0  . Multiple Vitamin (MULTIVITAMIN WITH MINERALS) TABS tablet Take 1 tablet by mouth daily. For nutritional supplementation. 30 tablet 0  . SUMAtriptan (IMITREX) 6 MG/0.5ML SOLN injection Inject 0.5 mLs (6 mg total) into the skin every 2 (two) hours as needed for migraine or headache. May repeat in 2 hours if headache persists or recurs.  0   No facility-administered medications prior to visit.    PAST MEDICAL HISTORY: Past Medical History  Diagnosis Date  . Bipolar disorder ([place])   . Eczema   . Pes planus   . Achilles tendinitis   . Hypertension   . Migraine   . Achilles tendinitis   . Ganglion cyst 09/29/2009    left wrist (2 cyst)  . Arthritis   . Allergy   . Lipoma   . ADHD (attention deficit hyperactivity disorder)   . Hyperprolactinemia ([place])   . Bipolar affective ([place])   . Personality disorder     PAST SURGICAL HISTORY: Past Surgical History  Procedure Laterality Date  . Tumor resection left thigh    . Ankle surgery  12/88    left   . Lipoma removal    . Ganglion cyst excision  2011  .  Chest nodule  1990?    rt chest wall nodule removal  . Shoulder surgery  01/13/2011    right, partial tear  . Right bunioectomy      FAMILY HISTORY: Family History  Problem Relation Age of Onset  . Hypertension Mother   . Hyperlipidemia Mother   . Heart attack Father   . Heart disease Father   . Hypertension Father   . Bipolar disorder Father   . Diabetes Paternal Grandfather   . Heart disease Maternal Aunt   . Breast cancer Maternal Aunt   . Heart disease Maternal Grandmother   . Cancer Maternal Grandmother     colon    SOCIAL HISTORY: Social History   Social History  . Marital Status: Single    Spouse Name: N/A  . Number of Children: 0  . Years of Education: N/A   Occupational History  .  [employer]  Social History Main Topics  . Smoking status: Never Smoker   . Smokeless tobacco: Never Used  . Alcohol Use: No  . Drug Use: No  . Sexual Activity: No   Other Topics Concern  . Not on file   Social History Narrative   Caffeine  2 sodas daily, 1 cup coffee daily.      PHYSICAL EXAM  Filed Vitals:   11/03/14 1320  BP: 110/78  Pulse: 74  Height: 5\' 10"  (1.778 m)  Weight: 251 lb 12 oz (114.193 kg)   Body mass index is 36.12 kg/(m^2).  Generalized: Well developed, in no acute distress   Neurological examination  Mentation: Alert oriented to time, place, history taking. Follows all commands speech and language fluent Cranial nerve II-XII: Pupils were equal round reactive to light. Extraocular movements were full, visual field were full on confrontational test. Facial sensation and strength were normal. Uvula tongue midline. Head turning and shoulder shrug  were normal and symmetric. Motor: The motor testing reveals 5 over 5 strength of all 4 extremities. Good symmetric motor tone is noted throughout.  Sensory: Sensory testing is intact to soft touch on all 4 extremities. No evidence of extinction is noted.  Coordination: Cerebellar testing reveals good  finger-nose-finger and heel-to-shin bilaterally.  Gait and station: Gait is normal. Tandem gait is normal. Romberg is negative. No drift is seen.  Reflexes: Deep tendon reflexes are symmetric and normal bilaterally.   DIAGNOSTIC DATA (LABS, IMAGING, TESTING) - I reviewed patient records, labs, notes, testing and imaging myself where available.  Lab Results  Component Value Date   WBC 7.9 09/24/2014   HGB 13.9 09/24/2014   HCT 42.1 09/24/2014   MCV 92.9 09/24/2014   PLT 235 09/24/2014      Component Value Date/Time   NA 140 09/25/2014 0615   K 4.0 09/25/2014 0615   CL 103 09/25/2014 0615   CO2 29 09/25/2014 0615   GLUCOSE 91 09/25/2014 0615   BUN 12 09/25/2014 0615   CREATININE 1.04* 09/25/2014 0615   CREATININE 0.83 09/11/2011 1524   CALCIUM 9.3 09/25/2014 0615   PROT 5.6* 04/22/2014 0630   ALBUMIN 3.5 04/22/2014 0630   AST 15 04/22/2014 0630   ALT 13 04/22/2014 0630   ALKPHOS 48 04/22/2014 0630   BILITOT 0.4 04/22/2014 0630   GFRNONAA >60 09/25/2014 0615   GFRAA >60 09/25/2014 0615   Lab Results  Component Value Date   CHOL 185 09/25/2014   HDL 55 09/25/2014   LDLCALC 96 09/25/2014   TRIG 171* 09/25/2014   CHOLHDL 3.4 09/25/2014   Lab Results  Component Value Date   HGBA1C 5.4 09/25/2014    Lab Results  Component Value Date   TSH 1.277 04/14/2014      ASSESSMENT AND PLAN 44 y.o. year old female  has a past medical history of Bipolar disorder ([place]); Eczema; Pes planus; Achilles tendinitis; Hypertension; Migraine; Achilles tendinitis; Ganglion cyst (09/29/2009); Arthritis; Allergy; Lipoma; ADHD (attention deficit hyperactivity disorder); Hyperprolactinemia ([place]); Bipolar affective ([place]); and Personality disorder. here with:  1. Intractable migraines 2. Involuntary leg movements  The patient will continue on Depakote and Imitrex for her migraine therapy. She has tried multiple medications in the past including Botox without any benefit. With the patient's  history of depression and suicidal ideation I am very cautious about adding any additional medication. I have requested that the patient seek input from Dr. Caprice Beaver when she has her appointment later this month. The patient's involuntary leg movements could be  periodic leg movements during sleep? In the future she may require a sleep study to help diagnosis this.She has an appointment at the end of the month and was told that Dr. Caprice Beaver is planning on doing blood work to evaluate for this. Patient will call our office after her appointment with Dr. Caprice Beaver to discuss ongoing treatment for her migraines. She will follow-up in 3-4 months with Dr. Mechele Claude, MSN, NP-C 11/03/2014, 2:19 PM [facility] [address], [place], [place] [address] [phone]

## 2014-11-03 NOTE — Patient Instructions (Signed)
Continue Depakote and Imitrex Consider other medication to potentially help with migraines such as gabapentin/gralise/TCAs Leg spasms may be consider periodic leg movement disorder- can do a sleep study if needed Follow-up with Dr. Caprice Beaver- with her input we may add medication If your symptoms worsen or you develop new symptoms please let us know.

## 2014-11-04 NOTE — Progress Notes (Signed)
I agree with the assessment and plan as directed by NP .The patient is known to me .   Dorine Duffey, MD  

## 2014-11-18 ENCOUNTER — Telehealth: Payer: Self-pay | Admitting: Neurology

## 2014-11-18 DIAGNOSIS — G43111 Migraine with aura, intractable, with status migrainosus: Secondary | ICD-10-CM

## 2014-11-18 NOTE — Telephone Encounter (Signed)
Patient called after hour to report lightheadedness, nausea, no vomiting, tingling in both hands intermittently up to the shoulders. She was not sure what to do about these symptoms. She has had no recent medication changes. She has an appointment on 12/02/2014 with Dr. Brett Fairy. Upon further asking, the patient has not had anything to drink today. She has not skipped any meals. She was at work. I suggested that if she feels suddenly worse that she should call 911 or go to the ER. She is strongly advised to keep well hydrated. I promised her that I would notify her doctor to see if she needs to come in sooner than her scheduled appointment which is in a couple of weeks or if there is any other recommendations from Dr. Keturah Barre. she demonstrated understanding and agreement.

## 2014-11-19 NOTE — Telephone Encounter (Signed)
Called pt again, no answer, left a message asking her to call me back.

## 2014-11-19 NOTE — Telephone Encounter (Signed)
Called pt to discuss how she feels today and if she wants a depakote infusion.  No answer, left a message asking her to call me back.

## 2014-11-19 NOTE — Telephone Encounter (Signed)
[name] , Agree with encouraging hydration. This is not the first of these spells. Does she want a depakote infusion? CD

## 2014-11-20 MED ORDER — DIVALPROEX SODIUM ER 500 MG PO TB24: 1500.0000 mg | ORAL_TABLET | Freq: Every day | ORAL | Status: DC

## 2014-11-20 MED ORDER — PROMETHAZINE HCL 25 MG PO TABS: 25.0000 mg | ORAL_TABLET | Freq: Four times a day (QID) | ORAL | Status: DC | PRN

## 2014-11-20 NOTE — Addendum Note (Signed)
Addended by: Larey Seat on: 11/20/2014 11:48 AM   Modules accepted: Orders

## 2014-11-20 NOTE — Telephone Encounter (Signed)
Patient doesn't want to wait until Monday for return call from nurse. Patient doesn't want to "feel like crap all weekend". Please call [phone].

## 2014-11-20 NOTE — Telephone Encounter (Signed)
I have left a voicemail with the patient asking her to take Phenergan and 500 mg Depakote by mouth and try to sleep the headache off- it has sometimes worked-  I know that it is unlikely in a chronic migraine , but she otherwise she would have to come to the emergency room or go to an urgent care.

## 2014-11-20 NOTE — Telephone Encounter (Signed)
Returned call. No answer. Will route call to RN-Kristen Dinkins to follow up on Monday.

## 2014-11-20 NOTE — Telephone Encounter (Signed)
Early dose, with phenergan.

## 2014-11-20 NOTE — Telephone Encounter (Signed)
Patient returned call

## 2014-11-20 NOTE — Telephone Encounter (Addendum)
Why was this not send to a RN? She could have come in for IV depakote today. ? I see that there were several attempts made to reach the patient but she did not answer the phone. I also tried to reach her and could only leave a voicemail I have offered yesterday to put her on an IV Depakote infusion today and Cyril Mourning tried to reach her but could not get through.   If the patient requests a call back by a nurse please send this note to a nurse .

## 2014-11-20 NOTE — Telephone Encounter (Signed)
I have spoken with [name], and per Dr. Brett Fairy, advised that she would take her Depakote early, with phenergan.  She verbalized understanding of same, sts. she just got a new phone, not sure if vm is working right, she got messages late and wanted to apologize for not returning our office's calls sooner./fim

## 2014-12-02 ENCOUNTER — Ambulatory Visit (INDEPENDENT_AMBULATORY_CARE_PROVIDER_SITE_OTHER): Payer: PPO | Admitting: Neurology

## 2014-12-02 ENCOUNTER — Encounter: Payer: Self-pay | Admitting: Neurology

## 2014-12-02 VITALS — BP 122/62 | HR 68 | Resp 20 | Ht 70.0 in | Wt 248.0 lb

## 2014-12-02 DIAGNOSIS — G43511 Persistent migraine aura without cerebral infarction, intractable, with status migrainosus: Secondary | ICD-10-CM

## 2014-12-02 DIAGNOSIS — G4759 Other parasomnia: Secondary | ICD-10-CM

## 2014-12-02 DIAGNOSIS — G4752 REM sleep behavior disorder: Secondary | ICD-10-CM

## 2014-12-02 DIAGNOSIS — G44019 Episodic cluster headache, not intractable: Secondary | ICD-10-CM

## 2014-12-02 DIAGNOSIS — G44049 Chronic paroxysmal hemicrania, not intractable: Secondary | ICD-10-CM | POA: Insufficient documentation

## 2014-12-02 DIAGNOSIS — G4713 Recurrent hypersomnia: Secondary | ICD-10-CM | POA: Diagnosis not present

## 2014-12-02 HISTORY — DX: Episodic cluster headache, not intractable: G44.019

## 2014-12-02 HISTORY — DX: Chronic paroxysmal hemicrania, not intractable: G44.049

## 2014-12-02 MED ORDER — VALPROATE SODIUM 500 MG/5ML IV SOLN: 500.0000 mg | Freq: Once | INTRAVENOUS | Status: DC | PRN

## 2014-12-02 NOTE — Progress Notes (Signed)
SLEEP MEDICINE CLINIC   Provider:  Larey Seat, M D  Referring Provider: Aretta Nip, MD Primary Care Physician:  [name], MD   HPI:  [name] is a 44 y.o. female , seen here as a referral  from Dr. Caprice Beaver for a sleep consultation.  [name] has been followed in this office for migraine headaches but is today referred for a sleep consultation. She endorsed the Epworth sleepiness score between 13 and 15 points during her last visit with a psychiatrist. She also reports more myoclonic appearing jerks, both lower extremities with jump at the same time when she is just ready to fall asleep and prolonged her sleep latency thereby.  Chief complaint according to patient : "I fall asleep all the time. ";  " I have more migraines- they split right in the middle , affect my left face ".   Sleep habits are as follows: The patient reports that she does not have any screen light or lights penetrating her bedroom which is described as core, quiet and dark. She sleeps alone in the bedroom. She usually goes to bed around 11:30 and 12 PM, usually it will take her about 30 minutes to fall asleep. She has noticed that both legs seem to gels and get jumpy by the time she feels ready to enter sleep. She finds herself them again unable to fall asleep. These may not to be restless legs ;she does not describe creepy crawly sensations, rather myoclonic jerks. She has fallen out of bed several times sometimes in relation to visit nightmarish dreams being chased being under threat fearing for her life. These visit dreams are very detailed areas she has installed bed rails to prevent falling. She does usually not sleep walk. Mother has not witnessed any sleep talking either, but yelling . She lives in the same home as her mother, the bedrooms are across the hall from each other. Her mother has sometimes woken her when she seemed to have a nightmare and may have yelled or acted out.  Nightmares seem to occur within about 2 hours of sleep initiation. Usually only once per night. They don't occur every night either but perhaps 4 or 5 times a month. She states that she scrambles to switch the light on and that that gives her certainty that her dream is not reality. She has placed a flashlight on her nightstand for that reason. She has woken up finding that the nightstand had been moved or  she finds herself on the floor. If she has no appointments to keep in the morning she rises by about 9 AM, she relies on an alarm and she often hits the snooze button repeatedly. Most of the time she is neither refreshed nor restored in the morning. Some morning she will wake up with a dull headache throbbing, this may lift as the day goes on. Other nights she will wake up from a sharp stabbing headache that has a attack character. These cluster headaches usually occur more towards the morning hours but do not occur usually was in the first hour of sleep. She treats these with Imitrex. She will try to sleep these headaches off.  Social history: The patient will have a caffeine intake of about 2 sodas a day plus iced teas, rarely coffee. She will drink tea also for lunch or dinner. Non smoker, non alcohol drinker. Single , graduate of a local college.   Hypersomnia with extreme fatigue and depression. She is finally gainfully employed.  Mother has insomnia, has trouble falling asleep and staying asleep. Father sleep habits are not known. Brother seems not to snore or have apnea.    Review of Systems: Out of a complete 14 system review, the patient complains of only the following symptoms, and all other reviewed systems are negative.  How likely are you to doze in the following situations: 0 = not likely, 1 = slight chance, 2 = moderate chance, 3 = high chance  Sitting and Reading? 3 Watching Television? 1 Sitting inactive in a public place (theater or meeting)? 3 Lying down in the afternoon  when circumstances permit? 3 Sitting and talking to someone? 1 Sitting quietly after lunch without alcohol? 2 In a car, while stopped for a few minutes in traffic?2 As a passenger in a car for an hour without a break? 3  Total = 18    Epworth score  18 , Fatigue severity score 57  , depression score PHQ9    Social History   Social History  . Marital Status: Single    Spouse Name: N/A  . Number of Children: 0  . Years of Education: N/A   Occupational History  .  [employer]   Social History Main Topics  . Smoking status: Never Smoker   . Smokeless tobacco: Never Used  . Alcohol Use: No  . Drug Use: No  . Sexual Activity: No   Other Topics Concern  . Not on file   Social History Narrative   Caffeine  2 sodas daily, 1 cup coffee daily.    Family History  Problem Relation Age of Onset  . Hypertension Mother   . Hyperlipidemia Mother   . Heart attack Father   . Heart disease Father   . Hypertension Father   . Bipolar disorder Father   . Diabetes Paternal Grandfather   . Heart disease Maternal Aunt   . Breast cancer Maternal Aunt   . Heart disease Maternal Grandmother   . Cancer Maternal Grandmother     colon    Past Medical History  Diagnosis Date  . Bipolar disorder ([place])   . Eczema   . Pes planus   . Achilles tendinitis   . Hypertension   . Migraine   . Achilles tendinitis   . Ganglion cyst 09/29/2009    left wrist (2 cyst)  . Arthritis   . Allergy   . Lipoma   . ADHD (attention deficit hyperactivity disorder)   . Hyperprolactinemia ([place])   . Bipolar affective ([place])   . Personality disorder     Past Surgical History  Procedure Laterality Date  . Tumor resection left thigh    . Ankle surgery  12/88    left   . Lipoma removal    . Ganglion cyst excision  2011  . Chest nodule  1990?    rt chest wall nodule removal  . Shoulder surgery  01/13/2011    right, partial tear  . Right bunioectomy      Current Outpatient Prescriptions  Medication Sig  Dispense Refill  . azelastine (ASTELIN) 0.1 % nasal spray instill 1 to 2 sprays into each nostril twice a day  0  . divalproex (DEPAKOTE ER) 500 MG 24 hr tablet Take 3 tablets (1,500 mg total) by mouth at bedtime. For mood stabilization 90 tablet 1  . hydrochlorothiazide (HYDRODIURIL) 25 MG tablet Take 1 tablet (25 mg total) by mouth daily. For blood pressure control. 30 tablet 0  . hydrOXYzine (ATARAX/VISTARIL) 50 MG tablet Take  1 tablet (50 mg total) by mouth 2 (two) times daily as needed for anxiety (insomnia). 45 tablet 0  . ibuprofen (ADVIL,MOTRIN) 800 MG tablet Take 1 tablet (800 mg total) by mouth every 8 (eight) hours as needed. 90 tablet 0  . lamoTRIgine (LAMICTAL) 200 MG tablet Take 1 tablet (200 mg total) by mouth at bedtime. For mood stabilization 30 tablet 0  . lurasidone (LATUDA) 80 MG TABS tablet Take 1 tablet (80 mg total) by mouth daily at 6 PM. For mood control 30 tablet 0  . methocarbamol (ROBAXIN) 500 MG tablet Take 1 tablet (500 mg total) by mouth every 6 (six) hours as needed for muscle spasms. 12 tablet 1  . metoprolol succinate (TOPROL-XL) 100 MG 24 hr tablet Take 1 tablet (100 mg total) by mouth daily. Take with or immediately following a meal for blood pressure control. 30 tablet 0  . mirtazapine (REMERON) 30 MG tablet   0  . Multiple Vitamin (MULTIVITAMIN WITH MINERALS) TABS tablet Take 1 tablet by mouth daily. For nutritional supplementation. 30 tablet 0  . promethazine (PHENERGAN) 25 MG tablet Take 1 tablet (25 mg total) by mouth every 6 (six) hours as needed for nausea or vomiting. 30 tablet 1  . SUMAtriptan (IMITREX) 6 MG/0.5ML SOLN injection Inject 0.5 mLs (6 mg total) into the skin every 2 (two) hours as needed for migraine or headache. May repeat in 2 hours if headache persists or recurs.  0  . zolpidem (AMBIEN) 10 MG tablet Take 10 mg by mouth at bedtime.     No current facility-administered medications for this visit.    Allergies as of 12/02/2014 - Review  Complete 12/02/2014  Allergen Reaction Noted  . Adhesive [tape] Itching and Rash 09/12/2010  . Dilaudid [hydromorphone hcl] Itching 04/18/2011  . Morphine Nausea And Vomiting 04/06/2009  . Penicillins Hives 04/06/2009  . Percocet [oxycodone-acetaminophen] Itching 04/18/2011  . Prednisone Hives 10/15/2009  . Provera [medroxyprogesterone acetate] Other (See Comments) 08/08/2010  . Ultram [tramadol hcl] Itching 04/18/2011    Vitals: BP 122/62 mmHg  Pulse 68  Resp 20  Ht 5\' 10"  (1.778 m)  Wt 248 lb (112.492 kg)  BMI 35.58 kg/m2 Last Weight:  Wt Readings from Last 1 Encounters:  12/02/14 248 lb (112.492 kg)   WHQ:PRFF mass index is 35.58 kg/(m^2).     Last Height:   Ht Readings from Last 1 Encounters:  12/02/14 5\' 10"  (1.778 m)    Physical exam:  General: The patient is awake, alert and appears not in acute distress. The patient is well groomed. Head: Normocephalic, atraumatic. Neck is supple. Mallampati 2  neck circumference: 15. Nasal airflow unrestricted , TMJ is not  evident . Retrognathia is not seen, rather prognathia .  Cardiovascular:  Regular rate and rhythm , without  murmurs or carotid bruit, and without distended neck veins. Respiratory: Lungs are clear to auscultation. Skin:  Without evidence of edema, or rash Trunk: BMI is 36 Neurologic exam : The patient is awake and alert, oriented to place and time.   Attention span & concentration ability appears normal.  Speech is fluent,  without dysarthria, dysphonia or aphasia.  Mood and affect are appropriate. She is not depressed, rather aloof.   Cranial nerves: Pupils are equal and briskly reactive to light. Funduscopic exam without evidence of pallor or edema. Extraocular movements  in vertical and horizontal planes intact and without nystagmus. Visual fields by finger perimetry are intact. Hearing to finger rub intact.   Facial sensation intact  to fine touch.  Facial motor strength is symmetric and tongue and uvula  move midline. Shoulder shrug was symmetrical.   Motor exam:  Normal tone, muscle bulk and symmetric strength in all extremities. Sensory:  Fine touch, pinprick and vibration were tested in all extremities.  Proprioception tested in the upper extremities was normal. Coordination: Rapid alternating movements in the fingers/hands was normal.  Finger-to-nose maneuver normal without evidence of ataxia, dysmetria or tremor. Gait and station: Patient walks without assistive device and is able unassisted to climb up to the exam table. Strength within normal limits.  Stance is stable and normal based . Toe and heel stand were tested , and normal. Tandem gait is unfragmented. Turns with  3 Steps. Romberg testing is negative. Deep tendon reflexes: in the  upper and lower extremities are symmetric and intact. Babinski maneuver response is   downgoing. The patient was advised of the nature of the diagnosed sleep disorder , the treatment options and risks for general a health and wellness arising from not treating the condition.  I spent more than 40 minutes of face to face time with the patient. Greater than 50% of time was spent in counseling and coordination of care. We have discussed the diagnosis and differential and I answered the patient's questions.     Assessment:  After physical and neurologic examination, review of laboratory studies,  Personal review of imaging studies, reports of other /same imaging studies,  Results of EEG and  neurophysiology testing and pre-existing psychiatric records as far as provided in visit., my assessment is   1) Mixed episode bipolar disease. In August she had some medication adjustment but she has stayed on that to water which seems to stabilize her.  2) the patient describes Cluster headaches, headaches that wake her out of sleep with a stabbing sharp character as if penetrating the eye or temple. Cluster headaches occur mostly in slow-wave sleep. She's     not sure  about the time of the night when these occur but it may be in the later hours of the night.   3)I would like to obtain a sleep study to verify that she does not suffer from hypoxemia or hypercapnia. In addition I want to rule out obstructive sleep apnea because of her very high Epworth sleepiness score    and fatigue severity. The elevated body mass index is a risk factor, but she does not have a narrow upper airway.  4)  Fragmented sleep- nightmares. Possible parasomnia - will order extended EEG montage.   Patient sometimes wakes up with a headache of a different quality a more dull and throbbing headache. This could be hypoxemia related as well . There may be some impact from the caffeine intake especially of late in the afternoon. We will discussed sleep hygiene in detail including reducing fluid intake after 6 PM reducing caffeine intake afternoon, and seeking exposure to bright daylight in the morning for at least half an hour to stimulate wakefulness. A routine exercise program would be beneficial but it could just contain a walking schedule. She has joined the [employer].   Plan:  Split study with capnography and expanded EEG montage.            I will also order an IV treatment for the patient's migrainous headache that she presented with today.            Depakote 500 mg IV with 25 mg of Medrol. If partial relief from headaches, this IV can  be repeated once.     Asencion Partridge Demitra Danley MD  12/02/2014   CC: Aretta Nip, [address] [facility] [place], [place] [address]

## 2014-12-09 ENCOUNTER — Ambulatory Visit: Payer: Self-pay | Admitting: Family Medicine

## 2014-12-10 ENCOUNTER — Encounter: Payer: Self-pay | Admitting: Physical Therapy

## 2014-12-10 ENCOUNTER — Telehealth: Payer: Self-pay | Admitting: Family Medicine

## 2014-12-10 ENCOUNTER — Ambulatory Visit: Payer: PPO | Attending: Family Medicine | Admitting: Physical Therapy

## 2014-12-10 DIAGNOSIS — M25551 Pain in right hip: Secondary | ICD-10-CM | POA: Insufficient documentation

## 2014-12-10 DIAGNOSIS — M25511 Pain in right shoulder: Secondary | ICD-10-CM | POA: Insufficient documentation

## 2014-12-10 DIAGNOSIS — M545 Low back pain, unspecified: Secondary | ICD-10-CM

## 2014-12-10 MED ORDER — IBUPROFEN 800 MG PO TABS: 800.0000 mg | ORAL_TABLET | Freq: Three times a day (TID) | ORAL | Status: DC | PRN

## 2014-12-10 NOTE — Therapy (Signed)
[facility] [place] [place], [place], [address] Phone: [phone]   Fax:  [phone]  Physical Therapy Evaluation  Patient Details  Name: [name] MRN: [id number] Date of Birth: 1970/07/26 Referring Provider: Karlton Lemon  Encounter Date: 12/10/2014      PT End of Session - 12/10/14 0819    Visit Number 1   Date for PT Re-Evaluation 02/09/15   PT Start Time 0747   PT Stop Time 0840   PT Time Calculation (min) 53 min   Activity Tolerance Patient tolerated treatment well   Behavior During Therapy [facility] for tasks assessed/performed      Past Medical History  Diagnosis Date  . Bipolar disorder ([place])   . Eczema   . Pes planus   . Achilles tendinitis   . Hypertension   . Migraine   . Achilles tendinitis   . Ganglion cyst 09/29/2009    left wrist (2 cyst)  . Arthritis   . Allergy   . Lipoma   . ADHD (attention deficit hyperactivity disorder)   . Hyperprolactinemia ([place])   . Bipolar affective ([place])   . Personality disorder     Past Surgical History  Procedure Laterality Date  . Tumor resection left thigh    . Ankle surgery  12/88    left   . Lipoma removal    . Ganglion cyst excision  2011  . Chest nodule  1990?    rt chest wall nodule removal  . Shoulder surgery  01/13/2011    right, partial tear  . Right bunioectomy      There were no vitals filed for this visit.  Visit Diagnosis:  Midline low back pain without sciatica - Plan: PT plan of care cert/re-cert  Right hip pain - Plan: PT plan of care cert/re-cert      Subjective Assessment - 12/10/14 0754    Subjective Patient reports that on August 26th she fell onto her bottom.  She reports immediate pain, reports that she has had some decrease in pain, but pain persists and has pain in the right anterior hip.   Diagnostic tests x-rays negative   Patient Stated Goals no pain   Currently in Pain? Yes   Pain Score 5    Pain  Location Back  also pain in the right anterio hip   Pain Orientation Lower   Pain Descriptors / Indicators Aching;Burning;Spasm;Tightness   Pain Type Chronic pain   Pain Onset More than a month ago   Pain Frequency Constant   Aggravating Factors  worse with activity, standing, bending pain up to 9/10   Pain Relieving Factors heating pad and rest helps some pain at best a 4/10   Effect of Pain on Daily Activities just difficult with all activities            [facility] PT Assessment - 12/10/14 0001    Assessment   Medical Diagnosis low back pain   Referring Provider Shane Hudnall   Onset Date/Surgical Date 09/25/14   Prior Therapy earlier this year for shoulder and trap   Precautions   Precautions None   Balance Screen   Has the patient fallen in the past 6 months Yes   How many times? 1   Has the patient had a decrease in activity level because of a fear of falling?  No   Is the patient reluctant to leave their home because of a fear of falling?  No  Home Environment   Additional Comments does her own housework and yardwork   Prior Function   Level of Independence Independent   Vocation Part time employment   Vocation Requirements standing mostly, working in [employer], research (medical)   Leisure no exercise   AROM   Overall AROM Comments Lumbar ROM was decreased 75% with pain in the lumbar area, the right hip flexion was limited to 70 degrees flexion with pain in the right hip   Strength   Overall Strength Comments right hip 3+/5 with pain in the right hip, left LE was 4/5   Flexibility   Soft Tissue Assessment /Muscle Length --  very tight hip flexor, piriformis and HS   Palpation   Palpation comment she is very tight in the lumbar paraspinals, tender in the bilateral SI and the right anterior thigh   Ambulation/Gait   Gait Comments has antalgic gait on the right, slight toe out gait on the right                   [facility] PT Treatment/Exercise - Dec 13, 2014 0001    Moist Heat  Therapy   Number Minutes Moist Heat 15 Minutes   Moist Heat Location Lumbar Spine   Electrical Stimulation   Electrical Stimulation Location lumbar and right anterior hip   Electrical Stimulation Action premod   Electrical Stimulation Parameters tolerance   Electrical Stimulation Goals Pain                  PT Short Term Goals - Dec 13, 2014 [id number]    PT SHORT TERM GOAL #1   Title independent with initial HEP   Time 2   Period Weeks   Status New           PT Long Term Goals - 2014-12-13 0830    PT LONG TERM GOAL #1   Title decrease pain 50%   Time 8   Period Weeks   Status New   PT LONG TERM GOAL #2   Title report 50% less difficulty at work   Time 8   Period Weeks   Status New   PT LONG TERM GOAL #3   Title increase lumbar ROM 25%   Time 8   Period Weeks   Status New   PT LONG TERM GOAL #4   Title increase right hip flexion to 90 degrees in standing   Time 8   Period [place] - 12-13-14 0820    Clinical Impression Statement Patient had a fall onto her bottom in August, x-rays negative, she has continued to have lumbar pain and right anterio hip pain, she has spasms in the lumbar area, she is tight in the hip flexor, piriformis and in the HS.  Her lumbar ROM is limited 75%, has limited right hip ROM due to pain   Pt will benefit from skilled therapeutic intervention in order to improve on the following deficits Abnormal gait;Decreased mobility;Decreased range of motion;Difficulty walking;Decreased strength;Impaired flexibility;Increased muscle spasms;Pain   Rehab Potential Good   PT Frequency 2x / week   PT Duration 8 weeks   PT Treatment/Interventions Electrical Stimulation;Moist Heat;Therapeutic exercise;Patient/family education;Manual techniques   PT Next Visit Plan slowly start exercises          G-Codes - 12/13/14 0835    Functional Assessment Tool Used FOTO   Functional Limitation Other PT primary   Other PT  Primary  Current Status [phone]) At least 60 percent but less than 80 percent impaired, limited or restricted   Other PT Primary Goal Status [id number]) At least 40 percent but less than 60 percent impaired, limited or restricted       Problem List Patient Active Problem List   Diagnosis Date Noted  . Episodic cluster headache, 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  . 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 Hypersomnia, recurrent 12/02/2014  . Migraine aura, persistent, intractable, with status migrainosus 12/02/2014  . Lower back injury 09/30/2014  . Bipolar I disorder, most recent episode depressed ([place])   . MDD (major depressive disorder), recurrent, severe, with psychosis ([place]) 09/23/2014  . Injury of left index finger 08/20/2014  . Right ankle sprain 06/01/2014  . Contusion, multiple sites 06/01/2014  . Strain of right gastrocnemius muscle 06/01/2014  . Phonophobia 05/04/2014  . Photophobia of both eyes 05/04/2014  . Emotionally unstable borderline personality disorder 05/04/2014  . Nausea with vomiting 05/04/2014  . Mixed bipolar I disorder ([place])   . Bipolar I disorder, most recent episode mixed ([place]) 04/18/2014  . Bipolar affective disorder, depressed, severe ([place]) 04/12/2014  . Suicidal ideation 04/12/2014  . Injury of right shoulder and upper arm 02/17/2014  . Left leg pain 06/03/2013  . Right hip pain 06/03/2013  . Migraine with status migrainosus 01/08/2013  . Personality disorder   . Chronic migraine 05/08/2012  . Contact dermatitis 11/27/2011  . Major depressive disorder, recurrent episode ([place]) 10/27/2011  . Generalized anxiety disorder 10/27/2011  . 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 Borderline personality disorder 10/27/2011  . Right foot pain 09/28/2011  . 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 Ganglion cyst  09/29/2009  . PES PLANUS 07/01/2008  . BIPOLAR DISORDER UNSPECIFIED 06/09/2008    Sumner Boast., PT 12/10/2014, 8:42 AM  [facility] [address] [place], [place], [address] Phone: [phone]   Fax:  [phone]  Name: [name] MRN: [id number] Date of Birth: December 16, 1970

## 2014-12-10 NOTE — Telephone Encounter (Signed)
Spoke to patient and told her that her prescription has been sent to her pharmacy.

## 2014-12-10 NOTE — Telephone Encounter (Signed)
Sent in ibuprofen.  Thanks!

## 2014-12-14 ENCOUNTER — Ambulatory Visit: Payer: PPO | Admitting: Physical Therapy

## 2014-12-14 ENCOUNTER — Encounter: Payer: Self-pay | Admitting: Physical Therapy

## 2014-12-14 DIAGNOSIS — M545 Low back pain, unspecified: Secondary | ICD-10-CM

## 2014-12-14 DIAGNOSIS — M25511 Pain in right shoulder: Secondary | ICD-10-CM

## 2014-12-14 DIAGNOSIS — M25551 Pain in right hip: Secondary | ICD-10-CM

## 2014-12-14 NOTE — Therapy (Signed)
[facility] [address] [place], [place], [address] Phone: [phone]   Fax:  [phone]  Physical Therapy Treatment  Patient Details  Name: [name] MRN: [id number] Date of Birth: Jun 08, 1970 Referring Provider: Karlton Lemon  Encounter Date: 12/14/2014      PT End of Session - 12/14/14 0831    Visit Number 2   Date for PT Re-Evaluation 02/09/15   PT Start Time 0802   PT Stop Time 0846   PT Time Calculation (min) 44 min   Activity Tolerance Patient tolerated treatment well   Behavior During Therapy [facility] for tasks assessed/performed      Past Medical History  Diagnosis Date  . Bipolar disorder ([place])   . Eczema   . Pes planus   . Achilles tendinitis   . Hypertension   . Migraine   . Achilles tendinitis   . Ganglion cyst 09/29/2009    left wrist (2 cyst)  . Arthritis   . Allergy   . Lipoma   . ADHD (attention deficit hyperactivity disorder)   . Hyperprolactinemia ([place])   . Bipolar affective ([place])   . Personality disorder     Past Surgical History  Procedure Laterality Date  . Tumor resection left thigh    . Ankle surgery  12/88    left   . Lipoma removal    . Ganglion cyst excision  2011  . Chest nodule  1990?    rt chest wall nodule removal  . Shoulder surgery  01/13/2011    right, partial tear  . Right bunioectomy      There were no vitals filed for this visit.  Visit Diagnosis:  Midline low back pain without sciatica  Right hip pain  Right shoulder pain      Subjective Assessment - 12/14/14 0803    Subjective Patient reports that she is having pain in the right shoulder blade, hip alittle better, the back is still hurting.  Working 8 hours later today.   Currently in Pain? Yes   Pain Score 6    Pain Location Back   Pain Orientation Lower                         [facility] Adult PT Treatment/Exercise - 12/14/14 0001    Shoulder Exercises: ROM/Strengthening    UBE (Upper Arm Bike) Level 1 x 3 minutes   "W" Arms 15 reps   X to V Arms 15 reps   Moist Heat Therapy   Number Minutes Moist Heat 15 Minutes   Moist Heat Location Lumbar Spine   Electrical Stimulation   Electrical Stimulation Location lumbar   Electrical Stimulation Action IFC   Electrical Stimulation Parameters tolerance   Electrical Stimulation Goals Pain   Manual Therapy   Manual Therapy Soft tissue mobilization;Passive ROM   Soft tissue mobilization STM to the lumbar paraspinals                  PT Short Term Goals - 12/10/14 [id number]    PT SHORT TERM GOAL #1   Title independent with initial HEP   Time 2   Period Weeks   Status New           PT Long Term Goals - 12/10/14 0830    PT LONG TERM GOAL #1   Title decrease pain 50%   Time 8   Period Weeks   Status New   PT LONG  TERM GOAL #2   Title report 50% less difficulty at work   Time 8   Period Weeks   Status New   PT LONG TERM GOAL #3   Title increase lumbar ROM 25%   Time 8   Period Weeks   Status New   PT LONG TERM GOAL #4   Title increase right hip flexion to 90 degrees in standing   Time 8   Period Weeks   Status New               Plan - 12/14/14 [id number]    Clinical Impression Statement Patient reports that her hip is feeling better.  Reports that she will be on her feet for over 8 hours latera t work, and that usually will increase pain.   PT Next Visit Plan will continue to add exercises   Consulted and Agree with Plan of Care Patient        Problem List Patient Active Problem List   Diagnosis Date Noted  . Episodic cluster headache, 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  . 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 Hypersomnia, recurrent 12/02/2014  . Migraine aura, persistent, intractable, with status migrainosus 12/02/2014  . Lower back injury 09/30/2014  . Bipolar I disorder, most recent episode depressed ([name])   . MDD  (major depressive disorder), recurrent, severe, with psychosis ([place]) 09/23/2014  . Injury of left index finger 08/20/2014  . Right ankle sprain 06/01/2014  . Contusion, multiple sites 06/01/2014  . Strain of right gastrocnemius muscle 06/01/2014  . Phonophobia 05/04/2014  . Photophobia of both eyes 05/04/2014  . Emotionally unstable borderline personality disorder 05/04/2014  . Nausea with vomiting 05/04/2014  . Mixed bipolar I disorder ([place])   . Bipolar I disorder, most recent episode mixed ([place]) 04/18/2014  . Bipolar affective disorder, depressed, severe ([place]) 04/12/2014  . Suicidal ideation 04/12/2014  . Injury of right shoulder and upper arm 02/17/2014  . Left leg pain 06/03/2013  . Right hip pain 06/03/2013  . Migraine with status migrainosus 01/08/2013  . Personality disorder   . Chronic migraine 05/08/2012  . Contact dermatitis 11/27/2011  . Major depressive disorder, recurrent episode ([place]) 10/27/2011  . Generalized anxiety disorder 10/27/2011  . 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 Borderline personality disorder 10/27/2011  . Right foot pain 09/28/2011  . 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 Ganglion cyst 09/29/2009  . PES PLANUS 07/01/2008  . BIPOLAR DISORDER UNSPECIFIED 06/09/2008    Sumner Boast., PT 12/14/2014, 8:37 AM  [address] [place], [place], [address] Phone: [phone]   Fax:  [phone]  Name: KATERRA CIUFO MRN: [id number] Date of Birth: 09-Mar-1970

## 2014-12-16 ENCOUNTER — Ambulatory Visit: Payer: PPO | Admitting: Physical Therapy

## 2014-12-16 ENCOUNTER — Encounter: Payer: Self-pay | Admitting: Physical Therapy

## 2014-12-16 DIAGNOSIS — M25511 Pain in right shoulder: Secondary | ICD-10-CM

## 2014-12-16 DIAGNOSIS — M545 Low back pain, unspecified: Secondary | ICD-10-CM

## 2014-12-16 DIAGNOSIS — M25551 Pain in right hip: Secondary | ICD-10-CM

## 2014-12-16 NOTE — Therapy (Signed)
[facility] [place], [place], [address] Phone: [phone]   Fax:  [phone]  Physical Therapy Treatment  Patient Details  Name: [name] MRN: [id number] Date of Birth: May 23, 1970 Referring Provider: Karlton Lemon  Encounter Date: 12/16/2014      PT End of Session - 12/16/14 0947    Visit Number 3   Date for PT Re-Evaluation 02/09/15   PT Start Time 0759   PT Stop Time 0850   PT Time Calculation (min) 51 min   Activity Tolerance Patient tolerated treatment well   Behavior During Therapy [facility] for tasks assessed/performed      Past Medical History  Diagnosis Date  . Bipolar disorder ([place])   . Eczema   . Pes planus   . Achilles tendinitis   . Hypertension   . Migraine   . Achilles tendinitis   . Ganglion cyst 09/29/2009    left wrist (2 cyst)  . Arthritis   . Allergy   . Lipoma   . ADHD (attention deficit hyperactivity disorder)   . Hyperprolactinemia ([place])   . Bipolar affective ([place])   . Personality disorder     Past Surgical History  Procedure Laterality Date  . Tumor resection left thigh    . Ankle surgery  12/88    left   . Lipoma removal    . Ganglion cyst excision  2011  . Chest nodule  1990?    rt chest wall nodule removal  . Shoulder surgery  01/13/2011    right, partial tear  . Right bunioectomy      There were no vitals filed for this visit.  Visit Diagnosis:  Midline low back pain without sciatica  Right hip pain  Right shoulder pain      Subjective Assessment - 12/16/14 0943    Subjective Reports that on Monday she worked 8+ hours, mostly standing on the feet, reports that she has increased pain in the back, shoulder and hip area   Patient Stated Goals no pain   Currently in Pain? Yes   Pain Score 7    Pain Location Back   Pain Orientation Lower   Aggravating Factors  standing   Pain Relieving Factors rest                         [facility] Adult PT Treatment/Exercise - 12/16/14 0001    Neck Exercises: Machines for Strengthening   UBE (Upper Arm Bike) 4 mins level 1   Shoulder Exercises: Standing   Other Standing Exercises red tband scapular stabilization, 4# shrugs with levator and upper trap stretches.   Shoulder Exercises: ROM/Strengthening   "W" Arms 15 reps   X to V Arms 15 reps   Moist Heat Therapy   Number Minutes Moist Heat 15 Minutes   Moist Heat Location Lumbar Spine   Electrical Stimulation   Electrical Stimulation Location lumbar and right anterior hip    Electrical Stimulation Action Pre mod   Electrical Stimulation Parameters tolerance   Electrical Stimulation Goals Pain   Manual Therapy   Manual Therapy Soft tissue mobilization;Passive ROM   Soft tissue mobilization STM to the lumbar paraspinals                  PT Short Term Goals - 12/16/14 0949    PT SHORT TERM GOAL #1   Title independent with initial HEP   Status Achieved  PT Long Term Goals - 12/16/14 0949    PT LONG TERM GOAL #1   Title decrease pain 50%   Status On-going               Plan - 12/16/14 0948    Clinical Impression Statement Patient expressed some worries about depression and anxiety, wanting to get off of her meds, we talked about her needing to continue with the meds and talk to her counselor as needed.   PT Next Visit Plan will continue to add exercises   Consulted and Agree with Plan of Care Patient        Problem List Patient Active Problem List   Diagnosis Date Noted  . Episodic cluster headache, 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  . 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 Hypersomnia, recurrent 12/02/2014  . Migraine aura, persistent, intractable, with status migrainosus 12/02/2014  . Lower back injury 09/30/2014  . Bipolar I disorder, most recent episode depressed ([place])   . MDD (major depressive disorder), recurrent,  severe, with psychosis ([place]) 09/23/2014  . Injury of left index finger 08/20/2014  . Right ankle sprain 06/01/2014  . Contusion, multiple sites 06/01/2014  . Strain of right gastrocnemius muscle 06/01/2014  . Phonophobia 05/04/2014  . Photophobia of both eyes 05/04/2014  . Emotionally unstable borderline personality disorder 05/04/2014  . Nausea with vomiting 05/04/2014  . Mixed bipolar I disorder ([place])   . Bipolar I disorder, most recent episode mixed ([place]) 04/18/2014  . Bipolar affective disorder, depressed, severe ([place]) 04/12/2014  . Suicidal ideation 04/12/2014  . Injury of right shoulder and upper arm 02/17/2014  . Left leg pain 06/03/2013  . Right hip pain 06/03/2013  . Migraine with status migrainosus 01/08/2013  . Personality disorder   . Chronic migraine 05/08/2012  . Contact dermatitis 11/27/2011  . Major depressive disorder, recurrent episode ([place]) 10/27/2011  . Generalized anxiety disorder 10/27/2011  . 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 Borderline personality disorder 10/27/2011  . Right foot pain 09/28/2011  . 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 Ganglion cyst 09/29/2009  . PES PLANUS 07/01/2008  . BIPOLAR DISORDER UNSPECIFIED 06/09/2008    Sumner Boast., PT 12/16/2014, 9:50 AM  [address] [place], [place], [address] Phone: [phone]   Fax:  [phone]  Name: [name] MRN: [id number] Date of Birth: 1970-10-11

## 2014-12-22 ENCOUNTER — Ambulatory Visit: Payer: PPO | Admitting: Physical Therapy

## 2014-12-30 ENCOUNTER — Ambulatory Visit: Payer: Self-pay | Admitting: Family Medicine

## 2014-12-30 ENCOUNTER — Ambulatory Visit (INDEPENDENT_AMBULATORY_CARE_PROVIDER_SITE_OTHER): Payer: PPO | Admitting: Family Medicine

## 2014-12-30 ENCOUNTER — Encounter: Payer: Self-pay | Admitting: Family Medicine

## 2014-12-30 VITALS — BP 117/77 | HR 71 | Ht 70.0 in | Wt 247.0 lb

## 2014-12-30 DIAGNOSIS — M75101 Unspecified rotator cuff tear or rupture of right shoulder, not specified as traumatic: Secondary | ICD-10-CM | POA: Diagnosis not present

## 2014-12-30 DIAGNOSIS — M7541 Impingement syndrome of right shoulder: Secondary | ICD-10-CM

## 2014-12-30 DIAGNOSIS — S6991XA Unspecified injury of right wrist, hand and finger(s), initial encounter: Secondary | ICD-10-CM | POA: Diagnosis not present

## 2014-12-30 DIAGNOSIS — M25511 Pain in right shoulder: Secondary | ICD-10-CM | POA: Diagnosis not present

## 2014-12-30 NOTE — Patient Instructions (Signed)
You have rotator cuff impingement Try to avoid painful activities (overhead activities, lifting with extended arm) as much as possible. Ibuprofen 800mg  three times a day with food for pain and inflammation. Can take tylenol in addition to this. Subacromial injection may be beneficial to help with pain and to decrease inflammation. Add physical therapy for this with transition to home exercise program. Do home exercise program with theraband and scapular stabilization exercises daily - these are very important for long term relief even if an injection was given. If not improving at follow-up we will consider further imaging, injection, and/or nitro patches. Follow up with me in 6 weeks for reevaluation.

## 2014-12-31 DIAGNOSIS — S6991XA Unspecified injury of right wrist, hand and finger(s), initial encounter: Secondary | ICD-10-CM | POA: Insufficient documentation

## 2014-12-31 DIAGNOSIS — M25511 Pain in right shoulder: Secondary | ICD-10-CM | POA: Insufficient documentation

## 2014-12-31 NOTE — Progress Notes (Signed)
PCP: Aretta Nip, MD  Subjective:   HPI: Patient is a 44 y.o. female here for back pain.  8/30: Patient reports on 8/26 she fell in the bathroom and scraped posterior low back against front of the toilet. + bruising and localized swelling. Pain level 6/10. Pain worse with twisting, bending forward. No history of osteoporosis. Taking tylenol, using ice pack initially - now taking robaxin and ibuprofen. No radiation into extremities. No numbness/tingling. No bowel/bladder dysfunction.  9/30: Patient reports she is not improving. Has had good and bad days. Pain level 6/10. Felt more on the left side of lumbar spine. Described as a soreness worse with bending, twisting. No swelling.  Bruising has improved. No radiation. No numbness/tingling. No bowel/bladder dysfunction. Taking robaxin and ibuprofen only as needed. Has been using heat.  11/30: Patient reports her back is 85% improved. Main problem is right shoulder pain without a new injury. Worse with overhead motions, reaching. 2/10 level of pain at rest, sharp with overhead motions. She also reports having struck ulnar aspect of right hand on something about a week ago. + swelling and bruising Has improved since then but still feels stiff. No skin changes, fever, other complaints.  Past Medical History  Diagnosis Date  . Bipolar disorder ([place])   . Eczema   . Pes planus   . Achilles tendinitis   . Hypertension   . Migraine   . Achilles tendinitis   . Ganglion cyst 09/29/2009    left wrist (2 cyst)  . Arthritis   . Allergy   . Lipoma   . ADHD (attention deficit hyperactivity disorder)   . Hyperprolactinemia ([place])   . Bipolar affective ([place])   . Personality disorder     Current Outpatient Prescriptions on File Prior to Visit  Medication Sig Dispense Refill  . azelastine (ASTELIN) 0.1 % nasal spray instill 1 to 2 sprays into each nostril twice a day  0  . divalproex (DEPAKOTE ER) 500 MG 24 hr tablet  Take 3 tablets (1,500 mg total) by mouth at bedtime. For mood stabilization 90 tablet 1  . hydrochlorothiazide (HYDRODIURIL) 25 MG tablet Take 1 tablet (25 mg total) by mouth daily. For blood pressure control. 30 tablet 0  . hydrOXYzine (ATARAX/VISTARIL) 50 MG tablet Take 1 tablet (50 mg total) by mouth 2 (two) times daily as needed for anxiety (insomnia). 45 tablet 0  . ibuprofen (ADVIL,MOTRIN) 800 MG tablet Take 1 tablet (800 mg total) by mouth every 8 (eight) hours as needed. 90 tablet 0  . lamoTRIgine (LAMICTAL) 200 MG tablet Take 1 tablet (200 mg total) by mouth at bedtime. For mood stabilization 30 tablet 0  . lurasidone (LATUDA) 80 MG TABS tablet Take 1 tablet (80 mg total) by mouth daily at 6 PM. For mood control 30 tablet 0  . methocarbamol (ROBAXIN) 500 MG tablet Take 1 tablet (500 mg total) by mouth every 6 (six) hours as needed for muscle spasms. 12 tablet 1  . metoprolol succinate (TOPROL-XL) 100 MG 24 hr tablet Take 1 tablet (100 mg total) by mouth daily. Take with or immediately following a meal for blood pressure control. 30 tablet 0  . mirtazapine (REMERON) 30 MG tablet   0  . Multiple Vitamin (MULTIVITAMIN WITH MINERALS) TABS tablet Take 1 tablet by mouth daily. For nutritional supplementation. 30 tablet 0  . promethazine (PHENERGAN) 25 MG tablet Take 1 tablet (25 mg total) by mouth every 6 (six) hours as needed for nausea or vomiting. 30 tablet 1  .  sodium chloride 0.9 % SOLN 100 mL with valproate 500 MG/5ML SOLN 500 mg Inject 500 mg into the vein once as needed. To be infused over  a 15 minutes period, repeat once if partial relief.    . SUMAtriptan (IMITREX) 6 MG/0.5ML SOLN injection Inject 0.5 mLs (6 mg total) into the skin every 2 (two) hours as needed for migraine or headache. May repeat in 2 hours if headache persists or recurs.  0  . zolpidem (AMBIEN) 10 MG tablet Take 10 mg by mouth at bedtime.     No current facility-administered medications on file prior to visit.     Past Surgical History  Procedure Laterality Date  . Tumor resection left thigh    . Ankle surgery  12/88    left   . Lipoma removal    . Ganglion cyst excision  2011  . Chest nodule  1990?    rt chest wall nodule removal  . Shoulder surgery  01/13/2011    right, partial tear  . Right bunioectomy      Allergies  Allergen Reactions  . Adhesive [Tape] Itching and Rash    Also reacted to Steri Strips and Band-Aids.  . Dilaudid [Hydromorphone Hcl] Itching  . Morphine Nausea And Vomiting  . Penicillins Hives  . Percocet [Oxycodone-Acetaminophen] Itching  . Prednisone Hives  . Provera [Medroxyprogesterone Acetate] Other (See Comments)    Causes manic episodes  . Ultram [Tramadol Hcl] Itching    Social History   Social History  . Marital Status: Single    Spouse Name: N/A  . Number of Children: 0  . Years of Education: N/A   Occupational History  .  [employer]   Social History Main Topics  . Smoking status: Never Smoker   . Smokeless tobacco: Never Used  . Alcohol Use: No  . Drug Use: No  . Sexual Activity: No   Other Topics Concern  . Not on file   Social History Narrative   Caffeine  2 sodas daily, 1 cup coffee daily.    Family History  Problem Relation Age of Onset  . Hypertension Mother   . Hyperlipidemia Mother   . Heart attack Father   . Heart disease Father   . Hypertension Father   . Bipolar disorder Father   . Diabetes Paternal Grandfather   . Heart disease Maternal Aunt   . Breast cancer Maternal Aunt   . Heart disease Maternal Grandmother   . Cancer Maternal Grandmother     colon    BP 117/77 mmHg  Pulse 71  Ht 5\' 10"  (1.778 m)  Wt 247 lb (112.038 kg)  BMI 35.44 kg/m2  Review of Systems: See HPI above.    Objective:  Physical Exam:  Gen: NAD  Right shoulder: No swelling, ecchymoses.  No gross deformity. No TTP. FROM with painful arc. Positive Hawkins, negative Neers. Negative Speeds, Yergasons. Strength 5/5 with empty can and  resisted internal/external rotation.  Pain empty can. Negative apprehension. NV intact distally.  Left shoulder: FROM without pain.  Right 5th digit: No gross deformity, swelling, bruising. No malrotation or angulation. No focal tenderness. Collateral ligaments intact. FROM with 5/5 strength flexion and extension at MCP, PIP, DIP joints. NVI distally.  Assessment & Plan:  1. Right shoulder pain - 2/2 rotator cuff impingement.  Start physical therapy and home exercises for this.  Consider injection, nitro patches, imaging if not improving.  F/u in 6 weeks.  2. Right 5th digit injury - 2/2 sprain, contusion -  much improved.  Follow up as needed for this.

## 2014-12-31 NOTE — Assessment & Plan Note (Signed)
2/2 rotator cuff impingement.  Start physical therapy and home exercises for this.  Consider injection, nitro patches, imaging if not improving.  F/u in 6 weeks.

## 2014-12-31 NOTE — Assessment & Plan Note (Signed)
2/2 sprain, contusion - much improved.  Follow up as needed for this.

## 2015-01-13 ENCOUNTER — Ambulatory Visit (INDEPENDENT_AMBULATORY_CARE_PROVIDER_SITE_OTHER): Payer: PPO | Admitting: Neurology

## 2015-01-13 DIAGNOSIS — G4752 REM sleep behavior disorder: Secondary | ICD-10-CM

## 2015-01-13 DIAGNOSIS — G4713 Recurrent hypersomnia: Secondary | ICD-10-CM

## 2015-01-13 DIAGNOSIS — G4710 Hypersomnia, unspecified: Secondary | ICD-10-CM

## 2015-01-13 DIAGNOSIS — G43511 Persistent migraine aura without cerebral infarction, intractable, with status migrainosus: Secondary | ICD-10-CM

## 2015-01-13 DIAGNOSIS — G4759 Other parasomnia: Secondary | ICD-10-CM

## 2015-01-14 NOTE — Sleep Study (Signed)
Please see the scanned sleep study interpretation located in the procedure tab within the chart review section.   

## 2015-01-19 ENCOUNTER — Telehealth: Payer: Self-pay

## 2015-01-19 NOTE — Telephone Encounter (Signed)
Spoke to pt regarding her sleep study results. I advised pt that her PSG revealed only very mild osa. This study did not reveal significant sleep apnea or significant PLMS resulting in significant sleep disruption. I advised her to sleep on her side. I also advised her that a dental device can be considered for snoring. I also advised an ENT examination if primary snoring is a clinical concern. Pt declined both the dental device and ENT evaluation at this time. Pt wishes for an appt to discuss her sleep study results in more detail with Dr. Brett Fairy. Appt made for 1/18 at 9:30 Pt verbalized understanding.

## 2015-01-19 NOTE — Telephone Encounter (Signed)
Pt returned call. Was told to stay by her phone. Pt expressed understanding

## 2015-01-19 NOTE — Telephone Encounter (Signed)
Called pt to discuss sleep study results. No answer, left a message asking her to call me back. 

## 2015-02-03 ENCOUNTER — Ambulatory Visit: Payer: Self-pay | Admitting: Neurology

## 2015-02-10 ENCOUNTER — Ambulatory Visit (INDEPENDENT_AMBULATORY_CARE_PROVIDER_SITE_OTHER): Payer: PPO | Admitting: Family Medicine

## 2015-02-10 ENCOUNTER — Encounter: Payer: Self-pay | Admitting: Family Medicine

## 2015-02-10 VITALS — BP 124/79 | HR 74 | Ht 70.0 in | Wt 242.0 lb

## 2015-02-10 DIAGNOSIS — M25511 Pain in right shoulder: Secondary | ICD-10-CM

## 2015-02-10 DIAGNOSIS — IMO0001 Reserved for inherently not codable concepts without codable children: Secondary | ICD-10-CM

## 2015-02-10 DIAGNOSIS — S66912A Strain of unspecified muscle, fascia and tendon at wrist and hand level, left hand, initial encounter: Secondary | ICD-10-CM

## 2015-02-10 DIAGNOSIS — S56911A Strain of unspecified muscles, fascia and tendons at forearm level, right arm, initial encounter: Secondary | ICD-10-CM | POA: Diagnosis not present

## 2015-02-10 NOTE — Patient Instructions (Signed)
You have rotator cuff impingement Try to avoid painful activities (overhead activities, lifting with extended arm) as much as possible. Ibuprofen 800mg  three times a day with food for pain and inflammation. Can take tylenol in addition to this. Subacromial injection may be beneficial to help with pain and to decrease inflammation. Add physical therapy for this with transition to home exercise program. Do home exercise program with theraband and scapular stabilization exercises daily - these are very important for long term relief even if an injection was given. If not improving at follow-up we will consider further imaging, injection, and/or nitro patches. Follow up with me in 6 weeks for reevaluation. I'd expect your forearm and thumb to improve over 2-4 more weeks.

## 2015-02-11 DIAGNOSIS — S56911A Strain of unspecified muscles, fascia and tendons at forearm level, right arm, initial encounter: Secondary | ICD-10-CM | POA: Insufficient documentation

## 2015-02-11 DIAGNOSIS — IMO0002 Reserved for concepts with insufficient information to code with codable children: Secondary | ICD-10-CM | POA: Insufficient documentation

## 2015-02-11 NOTE — Assessment & Plan Note (Signed)
2/2 rotator cuff impingement.  Mild improvement.  She will start physical therapy and home exercises for this.  Consider injection, nitro patches, imaging if not improving.  F/u in 6 weeks.

## 2015-02-11 NOTE — Assessment & Plan Note (Signed)
Ice/heat, nsaids as needed.  Expect improvement over 2-4 weeks.

## 2015-02-11 NOTE — Progress Notes (Signed)
PCP: Aretta Nip, MD  Subjective:   HPI: Patient is a 45 y.o. female here for right shoulder pain.  8/30: Patient reports on 8/26 she fell in the bathroom and scraped posterior low back against front of the toilet. + bruising and localized swelling. Pain level 6/10. Pain worse with twisting, bending forward. No history of osteoporosis. Taking tylenol, using ice pack initially - now taking robaxin and ibuprofen. No radiation into extremities. No numbness/tingling. No bowel/bladder dysfunction.  9/30: Patient reports she is not improving. Has had good and bad days. Pain level 6/10. Felt more on the left side of lumbar spine. Described as a soreness worse with bending, twisting. No swelling.  Bruising has improved. No radiation. No numbness/tingling. No bowel/bladder dysfunction. Taking robaxin and ibuprofen only as needed. Has been using heat.  11/30: Patient reports her back is 85% improved. Main problem is right shoulder pain without a new injury. Worse with overhead motions, reaching. 2/10 level of pain at rest, sharp with overhead motions. She also reports having struck ulnar aspect of right hand on something about a week ago. + swelling and bruising Has improved since then but still feels stiff. No skin changes, fever, other complaints.  02/10/15: Patient reports she has improved - pain at most 3/10. Still can be sharp with overhead motions lateral shoulder. Doing home exercises. Has not done physical therapy because of the holidays and work schedule. No skin changes, fever other complaints. Soreness left thumb and right forearm as well after shoveling ice.  Past Medical History  Diagnosis Date  . Bipolar disorder ([place])   . Eczema   . Pes planus   . Achilles tendinitis   . Hypertension   . Migraine   . Achilles tendinitis   . Ganglion cyst 09/29/2009    left wrist (2 cyst)  . Arthritis   . Allergy   . Lipoma   . ADHD (attention deficit  hyperactivity disorder)   . Hyperprolactinemia ([place])   . Bipolar affective ([place])   . Personality disorder     Current Outpatient Prescriptions on File Prior to Visit  Medication Sig Dispense Refill  . azelastine (ASTELIN) 0.1 % nasal spray instill 1 to 2 sprays into each nostril twice a day  0  . divalproex (DEPAKOTE ER) 500 MG 24 hr tablet Take 3 tablets (1,500 mg total) by mouth at bedtime. For mood stabilization 90 tablet 1  . hydrochlorothiazide (HYDRODIURIL) 25 MG tablet Take 1 tablet (25 mg total) by mouth daily. For blood pressure control. 30 tablet 0  . hydrOXYzine (ATARAX/VISTARIL) 50 MG tablet Take 1 tablet (50 mg total) by mouth 2 (two) times daily as needed for anxiety (insomnia). 45 tablet 0  . ibuprofen (ADVIL,MOTRIN) 800 MG tablet Take 1 tablet (800 mg total) by mouth every 8 (eight) hours as needed. 90 tablet 0  . lamoTRIgine (LAMICTAL) 200 MG tablet Take 1 tablet (200 mg total) by mouth at bedtime. For mood stabilization 30 tablet 0  . lurasidone (LATUDA) 80 MG TABS tablet Take 1 tablet (80 mg total) by mouth daily at 6 PM. For mood control 30 tablet 0  . methocarbamol (ROBAXIN) 500 MG tablet Take 1 tablet (500 mg total) by mouth every 6 (six) hours as needed for muscle spasms. 12 tablet 1  . metoprolol succinate (TOPROL-XL) 100 MG 24 hr tablet Take 1 tablet (100 mg total) by mouth daily. Take with or immediately following a meal for blood pressure control. 30 tablet 0  . mirtazapine (REMERON) 30  MG tablet   0  . Multiple Vitamin (MULTIVITAMIN WITH MINERALS) TABS tablet Take 1 tablet by mouth daily. For nutritional supplementation. 30 tablet 0  . promethazine (PHENERGAN) 25 MG tablet Take 1 tablet (25 mg total) by mouth every 6 (six) hours as needed for nausea or vomiting. 30 tablet 1  . sodium chloride 0.9 % SOLN 100 mL with valproate 500 MG/5ML SOLN 500 mg Inject 500 mg into the vein once as needed. To be infused over  a 15 minutes period, repeat once if partial relief.    .  SUMAtriptan (IMITREX) 6 MG/0.5ML SOLN injection Inject 0.5 mLs (6 mg total) into the skin every 2 (two) hours as needed for migraine or headache. May repeat in 2 hours if headache persists or recurs.  0  . zolpidem (AMBIEN) 10 MG tablet Take 10 mg by mouth at bedtime.     No current facility-administered medications on file prior to visit.    Past Surgical History  Procedure Laterality Date  . Tumor resection left thigh    . Ankle surgery  12/88    left   . Lipoma removal    . Ganglion cyst excision  2011  . Chest nodule  1990?    rt chest wall nodule removal  . Shoulder surgery  01/13/2011    right, partial tear  . Right bunioectomy      Allergies  Allergen Reactions  . Adhesive [Tape] Itching and Rash    Also reacted to Steri Strips and Band-Aids.  . Dilaudid [Hydromorphone Hcl] Itching  . Morphine Nausea And Vomiting  . Penicillins Hives  . Percocet [Oxycodone-Acetaminophen] Itching  . Prednisone Hives  . Provera [Medroxyprogesterone Acetate] Other (See Comments)    Causes manic episodes  . Ultram [Tramadol Hcl] Itching    Social History   Social History  . Marital Status: Single    Spouse Name: N/A  . Number of Children: 0  . Years of Education: N/A   Occupational History  .  [employer]   Social History Main Topics  . Smoking status: Never Smoker   . Smokeless tobacco: Never Used  . Alcohol Use: No  . Drug Use: No  . Sexual Activity: No   Other Topics Concern  . Not on file   Social History Narrative   Caffeine  2 sodas daily, 1 cup coffee daily.    Family History  Problem Relation Age of Onset  . Hypertension Mother   . Hyperlipidemia Mother   . Heart attack Father   . Heart disease Father   . Hypertension Father   . Bipolar disorder Father   . Diabetes Paternal Grandfather   . Heart disease Maternal Aunt   . Breast cancer Maternal Aunt   . Heart disease Maternal Grandmother   . Cancer Maternal Grandmother     colon    BP 124/79 mmHg  Pulse  74  Ht 5\' 10"  (1.778 m)  Wt 242 lb (109.77 kg)  BMI 34.72 kg/m2  Review of Systems: See HPI above.    Objective:  Physical Exam:  Gen: NAD  Right shoulder: No swelling, ecchymoses.  No gross deformity. No TTP. FROM with painful arc. Positive Hawkins, negative Neers. Negative Speeds, Yergasons. Strength 5/5 with empty can and resisted internal/external rotation.  Pain empty can. Negative apprehension. NV intact distally.  Left shoulder: FROM without pain.  Left thumb: No gross deformity, swelling, bruising. No malrotation or angulation. Mild tenderness thenar eminence only.  No bony, other tenderness. Collateral ligaments  intact. FROM with 5/5 strength flexion and extension at MCP, IP joints. NVI distally.  Right forearm: No gross deformity, swelling, bruising. Mild TTP extensors of forearm.   Pain with resisted wrist and 3rd digit extension.  Assessment & Plan:  1. Right shoulder pain - 2/2 rotator cuff impingement.  Mild improvement.  She will start physical therapy and home exercises for this.  Consider injection, nitro patches, imaging if not improving.  F/u in 6 weeks.  2. Left thumb strain - Ice/heat, nsaids as needed.  Expect improvement over 2-4 weeks.  3. Right forearm strain - Ice/heat, nsaids as needed.  Expect improvement over 2-4 weeks.

## 2015-02-16 ENCOUNTER — Encounter: Payer: Self-pay | Admitting: Physical Therapy

## 2015-02-16 ENCOUNTER — Ambulatory Visit: Payer: PPO | Attending: Family Medicine | Admitting: Physical Therapy

## 2015-02-16 DIAGNOSIS — M25611 Stiffness of right shoulder, not elsewhere classified: Secondary | ICD-10-CM | POA: Diagnosis not present

## 2015-02-16 DIAGNOSIS — M25511 Pain in right shoulder: Secondary | ICD-10-CM | POA: Insufficient documentation

## 2015-02-16 DIAGNOSIS — M25531 Pain in right wrist: Secondary | ICD-10-CM | POA: Insufficient documentation

## 2015-02-16 NOTE — Therapy (Signed)
[facility] [address] [place], [place], [address] Phone: [phone]   Fax:  [phone]  Physical Therapy Evaluation  Patient Details  Name: [name] MRN: [id number] Date of Birth: 13-May-1970 Referring Provider: Barbaraann Barthel  Encounter Date: 02/16/2015      PT End of Session - 02/16/15 0822    Visit Number 1   Date for PT Re-Evaluation 04/16/15   PT Start Time 0806   PT Stop Time 0900   PT Time Calculation (min) 54 min   Activity Tolerance Patient tolerated treatment well   Behavior During Therapy [facility] for tasks assessed/performed      Past Medical History  Diagnosis Date  . Bipolar disorder ([place])   . Eczema   . Pes planus   . Achilles tendinitis   . Hypertension   . Migraine   . Achilles tendinitis   . Ganglion cyst 09/29/2009    left wrist (2 cyst)  . Arthritis   . Allergy   . Lipoma   . ADHD (attention deficit hyperactivity disorder)   . Hyperprolactinemia ([place])   . Bipolar affective ([place])   . Personality disorder     Past Surgical History  Procedure Laterality Date  . Tumor resection left thigh    . Ankle surgery  12/88    left   . Lipoma removal    . Ganglion cyst excision  2011  . Chest nodule  1990?    rt chest wall nodule removal  . Shoulder surgery  01/13/2011    right, partial tear  . Right bunioectomy      There were no vitals filed for this visit.  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  She reports that with the extra work and use of the right arm she had some increased pain and spasms.  She reports that she recently did  inventory at work where she had to do a lot of reaching that exacerbated her pain   Patient Stated Goals have no pain with work   Currently in Pain? Yes   Pain Score 6    Pain Location Shoulder   Pain Orientation Right   Pain Descriptors / Indicators Aching;Spasm;Tightness   Pain Type Chronic pain   Pain Onset More than a month ago   Pain Frequency Constant   Aggravating Factors  reaching, working   Pain Relieving Factors rest   Effect of Pain on Daily Activities difficulty with ADL's and work with pain            [facility] PT Assessment - 02/16/15 0001    Assessment   Medical Diagnosis right shoulder and right wrist pain   Referring Provider [place]   Onset Date/Surgical Date 02/10/15   Hand Dominance Right   Prior Therapy has been seen for LBP   Precautions   Precautions None   Balance Screen   Has the patient fallen in the past 6 months No   Has the patient had a decrease in activity level because of a fear of falling?  No   Is the patient reluctant to  leave their home because of a fear of falling?  No   Home Environment   Additional Comments does her own housework and yardwork   Prior Function   Level of Independence Independent   Vocation Part time employment   Vocation Requirements standing mostly, working in [employer] (medical)   Leisure no exercise   Posture/Postural Control   Posture Comments fwd head, rounded shoulders   AROM   Overall AROM Comments right wrist AROM was gaurded but WFL's   AROM Assessment Site Shoulder   Right Shoulder Flexion 110 Degrees   Right Shoulder ABduction 70 Degrees   Right Shoulder Internal Rotation 30 Degrees   Right Shoulder External Rotation 50 Degrees   Strength   Overall Strength Comments right shoulder 3+/5 with pain in the available ROM, wrist was 3+/5 with pain in the wrist extensors   Flexibility   Soft Tissue Assessment /Muscle Length --  tight pectorals and LE's    Palpation   Palpation comment she has tightness, spasms and  tenderness in the right upper trap, rhomboid and posterior shoulder, tender in the right wrist extensors   Special Tests    Special Tests --  + right shoulder impingement                   [facility] Adult PT Treatment/Exercise - 02/16/15 0001    Moist Heat Therapy   Number Minutes Moist Heat 15 Minutes   Moist Heat Location Shoulder   Electrical Stimulation   Electrical Stimulation Location right shoulder area   Electrical Stimulation Action IFC   Electrical Stimulation Parameters tolerance   Electrical Stimulation Goals Pain                  PT Short Term Goals - 02/16/15 0833    PT SHORT TERM GOAL #1   Title independent with initial HEP   Time 2   Period Weeks   Status New           PT Long Term Goals - 02/16/15 [id number]    PT LONG TERM GOAL #1   Title decrease pain 50%   Time 8   Period Weeks   Status New   PT LONG TERM GOAL #2   Title report 50% less difficulty at work   Time 8   Period Weeks   Status New   PT LONG TERM GOAL #3   Title increase right shoulder AROM to 140 degrees flexion   Time 8   Period Weeks   Status New   PT LONG TERM GOAL #4   Title increase right shoulder AROM to 70 degrees ER   Time 8   Period Weeks   Status New               Plan - 02/16/15 [id number]    Clinical Impression Statement Patient with new referral for the right shoulder pain and right wrist strain.  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  She has spasms in the right upper trap and rhomboids, has a + impingement sign.  Her AROM of the right shoulder was very limited.   Pt will benefit from skilled therapeutic intervention in order to improve on the following deficits Decreased mobility;Decreased range of motion;Difficulty walking;Decreased strength;Impaired flexibility;Increased muscle spasms;Pain;Improper body mechanics;Postural dysfunction   Rehab Potential Good    PT Frequency 2x / week   PT Duration 8  weeks   PT Treatment/Interventions Electrical Stimulation;Moist Heat;Therapeutic exercise;Patient/family education;Manual techniques;Ultrasound;Taping;Passive range of motion   PT Next Visit Plan Add exercises and STM to the right shoulder   Consulted and Agree with Plan of Care Patient          G-Codes - 03-01-15 0834    Functional Assessment Tool Used FOTO 63% limitation   Functional Limitation Other PT primary   Other PT Primary Current Status [id number]) At least 60 percent but less than 80 percent impaired, limited or restricted   Other PT Primary Goal Status [id number]) At least 20 percent but less than 40 percent impaired, limited or restricted       Problem List Patient Active Problem List   Diagnosis Date Noted  . Strain of left thumb 02/11/2015  . Strain of right forearm 02/11/2015  . Right shoulder pain 12/31/2014  . Injury of right little finger 12/31/2014  . Episodic cluster headache, 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  . 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 Hypersomnia, recurrent 12/02/2014  . Migraine aura, persistent, intractable, with status migrainosus 12/02/2014  . Lower back injury 09/30/2014  . Bipolar I disorder, most recent episode depressed ([place])   . MDD (major depressive disorder), recurrent, severe, with psychosis ([place]) 09/23/2014  . Injury of left index finger 08/20/2014  . Right ankle sprain 06/01/2014  . Contusion, multiple sites 06/01/2014  . Strain of right gastrocnemius muscle 06/01/2014  . Phonophobia 05/04/2014  . Photophobia of both eyes 05/04/2014  . Emotionally unstable borderline personality disorder 05/04/2014  . Nausea with vomiting 05/04/2014  . Mixed bipolar I disorder ([place])   . Bipolar I disorder, most recent episode mixed ([place]) 04/18/2014  . Bipolar affective disorder, depressed, severe ([place]) 04/12/2014  . Suicidal ideation 04/12/2014  .  Injury of right shoulder and upper arm 02/17/2014  . Left leg pain 06/03/2013  . Right hip pain 06/03/2013  . Migraine with status migrainosus 01/08/2013  . Personality disorder   . Chronic migraine 05/08/2012  . Contact dermatitis 11/27/2011  . Major depressive disorder, recurrent episode ([place]) 10/27/2011  . Generalized anxiety disorder 10/27/2011  . 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 Borderline personality disorder 10/27/2011  . Right foot pain 09/28/2011  . 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 Ganglion cyst 09/29/2009  . PES PLANUS 07/01/2008  . BIPOLAR DISORDER UNSPECIFIED 06/09/2008    Sumner Boast., PT 03/01/15, 8:40 AM  [address] [place], [place], [address] Phone: [phone]   Fax:  [phone]  Name: [name] MRN: [id number] Date of Birth: 1970/04/18

## 2015-02-17 ENCOUNTER — Encounter: Payer: Self-pay | Admitting: Neurology

## 2015-02-17 ENCOUNTER — Ambulatory Visit (INDEPENDENT_AMBULATORY_CARE_PROVIDER_SITE_OTHER): Payer: PPO | Admitting: Neurology

## 2015-02-17 VITALS — BP 112/84 | HR 78 | Resp 20 | Ht 70.0 in | Wt 241.0 lb

## 2015-02-17 DIAGNOSIS — G4700 Insomnia, unspecified: Secondary | ICD-10-CM | POA: Insufficient documentation

## 2015-02-17 DIAGNOSIS — F489 Nonpsychotic mental disorder, unspecified: Secondary | ICD-10-CM | POA: Diagnosis not present

## 2015-02-17 DIAGNOSIS — F5105 Insomnia due to other mental disorder: Secondary | ICD-10-CM | POA: Diagnosis not present

## 2015-02-17 HISTORY — DX: Insomnia, unspecified: G47.00

## 2015-02-17 NOTE — Progress Notes (Signed)
[facility]   Provider:  Larey Seat, M D  Referring Provider: Aretta Nip, MD Primary Care Physician:  Aretta Nip, MD   HPI:  [name] is a 45 y.o. female , seen here as a referral  from Dr. Caprice Beaver for a sleep consultation.  [name] has been followed in this office for migraine headaches but is today referred for a sleep consultation. She endorsed the Epworth sleepiness score between 13 and 15 points during her last visit with a psychiatrist. She also reports more myoclonic appearing jerks, both lower extremities with jump at the same time when she is just ready to fall asleep and prolonged her sleep latency thereby.  Chief complaint according to patient : "I fall asleep all the time. ";  " I have more migraines- they split right in the middle , affect my left face ".   Sleep habits are as follows: The patient reports that she does not have any screen light or lights penetrating her bedroom which is described as core, quiet and dark. She sleeps alone in the bedroom. She usually goes to bed around 11:30 and 12 PM, usually it will take her about 30 minutes to fall asleep. She has noticed that both legs seem to gels and get jumpy by the time she feels ready to enter sleep. She finds herself them again unable to fall asleep. These may not to be restless legs ;she does not describe creepy crawly sensations, rather myoclonic jerks. She has fallen out of bed several times sometimes in relation to visit nightmarish dreams being chased being under threat fearing for her life. These visit dreams are very detailed areas she has installed bed rails to prevent falling. She does usually not sleep walk. Mother has not witnessed any sleep talking either, but yelling . She lives in the same home as her mother, the bedrooms are across the hall from each other. Her mother has sometimes woken her when she seemed to have a nightmare and may have yelled or acted out.  Nightmares seem to occur within about 2 hours of sleep initiation. Usually only once per night. They don't occur every night either but perhaps 4 or 5 times a month. She states that she scrambles to switch the light on and that that gives her certainty that her dream is not reality. She has placed a flashlight on her nightstand for that reason. She has woken up finding that the nightstand had been moved or  she finds herself on the floor. If she has no appointments to keep in the morning she rises by about 9 AM, she relies on an alarm and she often hits the snooze button repeatedly. Most of the time she is neither refreshed nor restored in the morning. Some morning she will wake up with a dull headache throbbing, this may lift as the day goes on. Other nights she will wake up from a sharp stabbing headache that has a attack character. These cluster headaches usually occur more towards the morning hours but do not occur usually was in the first hour of sleep. She treats these with Imitrex. She will try to sleep these headaches off.  Social history: The patient will have a caffeine intake of about 2 sodas a day plus iced teas, rarely coffee. She will drink tea also for lunch or dinner. Non smoker, non alcohol drinker. Single , graduate of a local college.   Hypersomnia with extreme fatigue and depression. She is finally  gainfully employed.   Mother has insomnia, has trouble falling asleep and staying asleep. Father sleep habits are not known. Brother seems not to snore or have apnea.  Split study with capnography and expanded EEG montage.            I will also order an IV treatment for the patient's migrainous headache that she presented with today.            Depakote 500 mg IV with 25 mg of Medrol. If partial relief from headaches, this IV can be repeated once.     02-17-15,  [name] is here today for her revisit after sleep study. Her PSG revealed only mild obstructive sleep apnea the overall AHI  was 8.3 exacerbated during REM sleep during REM sleep her AHI was 21.8. She had about 23% of the night spent in rem sleep which is highly unusual for patient on multiple psychotropic medications and antidepressants. She has reported nightmares and vivid dreams and there was some sleep related talking and even at one time yelling. I did not find a sleep correlation to her morning headaches and episodic nocturnal cluster headaches. The high degree of sleepiness and fatigue remains unexplained. I would offer her to treat this mild degree of apnea given her comorbidities. The patient slept rather well after 1:30 AM but still had 3 brief periods of wakefulness. Again the use of CPAP would be optional in her case but my concern is that it would make her insomnia worse. She slept all night supine.     Review of Systems: Out of a complete 14 system review, the patient complains of only the following symptoms, and all other reviewed systems are negative.  How likely are you to doze in the following situations: 0 = not likely, 1 = slight chance, 2 = moderate chance, 3 = high chance  Sitting and Reading? 2 Watching Television? 1 Sitting inactive in a public place (theater or meeting)? 1 Lying down in the afternoon when circumstances permit? 2 Sitting and talking to someone? 1 Sitting quietly after lunch without alcohol? 2 In a car, while stopped for a few minutes in traffic?1 As a passenger in a car for an hour without a break? 2  Total = 12    Epworth score  12 , Fatigue severity score 53  , depression score PHQ9    Social History   Social History  . Marital Status: Single    Spouse Name: N/A  . Number of Children: 0  . Years of Education: N/A   Occupational History  .  [employer]   Social History Main Topics  . Smoking status: Never Smoker   . Smokeless tobacco: Never Used  . Alcohol Use: No  . Drug Use: No  . Sexual Activity: No   Other Topics Concern  . Not on file   Social History  Narrative   Caffeine  2 sodas daily, 1 cup coffee daily.    Family History  Problem Relation Age of Onset  . Hypertension Mother   . Hyperlipidemia Mother   . Heart attack Father   . Heart disease Father   . Hypertension Father   . Bipolar disorder Father   . Diabetes Paternal Grandfather   . Heart disease Maternal Aunt   . Breast cancer Maternal Aunt   . Heart disease Maternal Grandmother   . Cancer Maternal Grandmother     colon    Past Medical History  Diagnosis Date  . Bipolar disorder ([place])   .  Eczema   . Pes planus   . Achilles tendinitis   . Hypertension   . Migraine   . Achilles tendinitis   . Ganglion cyst 09/29/2009    left wrist (2 cyst)  . Arthritis   . Allergy   . Lipoma   . ADHD (attention deficit hyperactivity disorder)   . Hyperprolactinemia ([place])   . Bipolar affective ([place])   . Personality disorder     Past Surgical History  Procedure Laterality Date  . Tumor resection left thigh    . Ankle surgery  12/88    left   . Lipoma removal    . Ganglion cyst excision  2011  . Chest nodule  1990?    rt chest wall nodule removal  . Shoulder surgery  01/13/2011    right, partial tear  . Right bunioectomy      Current Outpatient Prescriptions  Medication Sig Dispense Refill  . azelastine (ASTELIN) 0.1 % nasal spray instill 1 to 2 sprays into each nostril twice a day  0  . divalproex (DEPAKOTE ER) 500 MG 24 hr tablet Take 3 tablets (1,500 mg total) by mouth at bedtime. For mood stabilization 90 tablet 1  . hydrochlorothiazide (HYDRODIURIL) 25 MG tablet Take 1 tablet (25 mg total) by mouth daily. For blood pressure control. 30 tablet 0  . hydrOXYzine (ATARAX/VISTARIL) 50 MG tablet Take 1 tablet (50 mg total) by mouth 2 (two) times daily as needed for anxiety (insomnia). 45 tablet 0  . ibuprofen (ADVIL,MOTRIN) 800 MG tablet Take 1 tablet (800 mg total) by mouth every 8 (eight) hours as needed. 90 tablet 0  . lamoTRIgine (LAMICTAL) 200 MG tablet Take 1  tablet (200 mg total) by mouth at bedtime. For mood stabilization 30 tablet 0  . lurasidone (LATUDA) 80 MG TABS tablet Take 1 tablet (80 mg total) by mouth daily at 6 PM. For mood control 30 tablet 0  . metoprolol succinate (TOPROL-XL) 100 MG 24 hr tablet Take 1 tablet (100 mg total) by mouth daily. Take with or immediately following a meal for blood pressure control. 30 tablet 0  . Multiple Vitamin (MULTIVITAMIN WITH MINERALS) TABS tablet Take 1 tablet by mouth daily. For nutritional supplementation. 30 tablet 0  . promethazine (PHENERGAN) 25 MG tablet Take 1 tablet (25 mg total) by mouth every 6 (six) hours as needed for nausea or vomiting. 30 tablet 1  . sodium chloride 0.9 % SOLN 100 mL with valproate 500 MG/5ML SOLN 500 mg Inject 500 mg into the vein once as needed. To be infused over  a 15 minutes period, repeat once if partial relief.    . SUMAtriptan (IMITREX) 6 MG/0.5ML SOLN injection Inject 0.5 mLs (6 mg total) into the skin every 2 (two) hours as needed for migraine or headache. May repeat in 2 hours if headache persists or recurs.  0  . zolpidem (AMBIEN) 10 MG tablet Take 10 mg by mouth at bedtime.    . methocarbamol (ROBAXIN) 500 MG tablet Take 1 tablet (500 mg total) by mouth every 6 (six) hours as needed for muscle spasms. (Patient not taking: Reported on 02/17/2015) 12 tablet 1   No current facility-administered medications for this visit.    Allergies as of 02/17/2015 - Review Complete 02/17/2015  Allergen Reaction Noted  . Adhesive [tape] Itching and Rash 09/12/2010  . Dilaudid [hydromorphone hcl] Itching 04/18/2011  . Morphine Nausea And Vomiting 04/06/2009  . Penicillins Hives 04/06/2009  . Percocet [oxycodone-acetaminophen] Itching 04/18/2011  . Prednisone  Hives 10/15/2009  . Provera [medroxyprogesterone acetate] Other (See Comments) 08/08/2010  . Ultram [tramadol hcl] Itching 04/18/2011    Vitals: BP 112/84 mmHg  Pulse 78  Resp 20  Ht 5\' 10"  (1.778 m)  Wt 241 lb  (109.317 kg)  BMI 34.58 kg/m2 Last Weight:  Wt Readings from Last 1 Encounters:  02/17/15 241 lb (109.317 kg)   [id number] mass index is 34.58 kg/(m^2).     Last Height:   Ht Readings from Last 1 Encounters:  02/17/15 5\' 10"  (1.778 m)    Physical exam:  General: The patient is awake, alert and appears not in acute distress. The patient is well groomed. Head: Normocephalic, atraumatic. Neck is supple. Mallampati 2  neck circumference: 15. Nasal airflow unrestricted , TMJ is not  evident . Retrognathia is not seen, rather prognathia .  Cardiovascular:  Regular rate and rhythm , without  murmurs or carotid bruit, and without distended neck veins. Respiratory: Lungs are clear to auscultation. Skin:  Without evidence of edema, or rash Trunk: BMI is 36 Neurologic exam : The patient is awake and alert, oriented to place and time.   Attention span & concentration ability appears normal.  Speech is fluent,  without dysarthria, dysphonia or aphasia.  Mood and affect are appropriate. She is not depressed, rather aloof.   Cranial nerves: Pupils are equal and briskly reactive to light. Funduscopic exam ; beginning Cataracts. Diplopia.  Extraocular movements  in vertical and horizontal planes intact and without nystagmus. Visual fields by finger perimetry are intact. Hearing to finger rub intact.   Facial sensation intact to fine touch.  Facial motor strength is symmetric and tongue and uvula move midline. Shoulder shrug was symmetrical.   Motor exam:  Normal tone, muscle bulk and symmetric strength in all extremities. Sensory:  Fine touch, pinprick and vibration were tested in all extremities.  Proprioception tested in the upper extremities was normal. Coordination: Rapid alternating movements in the fingers/hands was normal.  Finger-to-nose maneuver normal without evidence of ataxia, dysmetria or tremor. Gait and station: Patient walks without assistive device and is able unassisted to climb  up to the exam table. Strength within normal limits.  Stance is stable and normal based . Toe and heel stand were tested , and normal. Tandem gait is unfragmented. Turns with  3 Steps. Romberg testing is negative. Deep tendon reflexes: in the  upper and lower extremities are symmetric and intact. Babinski maneuver response is downgoing.   The patient was advised of the nature of the diagnosed sleep disorder , the treatment options and risks for general a health and wellness arising from not treating the condition.  I spent more than 25 minutes of face to face time with the patient.  Greater than 50% of time was spent in counseling and coordination of care. We have discussed the diagnosis and differential and I answered the patient's questions.     Assessment:  After physical and neurologic examination, review of laboratory studies,  Personal review of imaging studies, reports of other /same imaging studies,  Results of EEG and  neurophysiology testing and pre-existing psychiatric records as far as provided in visit., my assessment is - mild OSA, no hypoxemia and Insomnia.   1) Mixed episode bipolar disease type 2 . In August she had some medication adjustment but she has stayed on that to water which seems to stabilize her. The dreaming and insomnia can well be related to her bipolar disorder.   2) the patient describes Cluster headaches,  headaches that wake her out of sleep with a stabbing sharp character as if penetrating the eye or temple. Cluster headaches occur mostly in slow-wave sleep. She's     not sure about the time of the night when these occur but it may be in the later hours of the night. She did not have these during her PSG.   Patient sometimes wakes up with a headache of a different quality a more dull and throbbing headache.  There may be some impact from the caffeine intake especially of late in the afternoon. We will discussed sleep hygiene in detail including reducing fluid intake  after 6 PM reducing caffeine intake afternoon, and seeking exposure to bright daylight in the morning for at least half an hour to stimulate wakefulness.  A routine exercise program would be beneficial but it could just contain a walking schedule. She has joined the [employer].   Plan:  We agreed after 20 minute discussion not to treat this mild OSA with CPAP, has no hypoxemia of significance. Rv prn with Np or me.    Asencion Partridge Yehya Brendle MD  02/17/2015   CC: Aretta Nip, [place], [place] [address]

## 2015-02-19 ENCOUNTER — Other Ambulatory Visit: Payer: Self-pay | Admitting: Neurology

## 2015-02-22 ENCOUNTER — Ambulatory Visit: Payer: PPO | Admitting: Physical Therapy

## 2015-02-25 ENCOUNTER — Encounter: Payer: Self-pay | Admitting: Physical Therapy

## 2015-02-25 ENCOUNTER — Ambulatory Visit: Payer: PPO | Admitting: Physical Therapy

## 2015-02-25 DIAGNOSIS — M25511 Pain in right shoulder: Secondary | ICD-10-CM

## 2015-02-25 DIAGNOSIS — M25611 Stiffness of right shoulder, not elsewhere classified: Secondary | ICD-10-CM

## 2015-02-25 DIAGNOSIS — M25531 Pain in right wrist: Secondary | ICD-10-CM

## 2015-02-25 NOTE — Therapy (Signed)
[place], [place], [address] Phone: [phone]   Fax:  [phone]  Physical Therapy Treatment  Patient Details  Name: [name] MRN: [id number] Date of Birth: 1970-04-24 Referring Provider: Barbaraann Barthel  Encounter Date: 02/25/2015      PT End of Session - 02/25/15 0925    Visit Number 2   Date for PT Re-Evaluation 04/16/15   PT Start Time 0848   PT Stop Time 0946   PT Time Calculation (min) 58 min   Activity Tolerance Patient tolerated treatment well   Behavior During Therapy [facility] for tasks assessed/performed      Past Medical History  Diagnosis Date  . Bipolar disorder ([place])   . Eczema   . Pes planus   . Achilles tendinitis   . Hypertension   . Migraine   . Achilles tendinitis   . Ganglion cyst 09/29/2009    left wrist (2 cyst)  . Arthritis   . Allergy   . Lipoma   . ADHD (attention deficit hyperactivity disorder)   . Hyperprolactinemia ([place])   . Bipolar affective ([place])   . Personality disorder     Past Surgical History  Procedure Laterality Date  . Tumor resection left thigh    . Ankle surgery  12/88    left   . Lipoma removal    . Ganglion cyst excision  2011  . Chest nodule  1990?    rt chest wall nodule removal  . Shoulder surgery  01/13/2011    right, partial tear  . Right bunioectomy      There were no vitals filed for this visit.  Visit Diagnosis:  Right shoulder pain  Stiffness of right shoulder joint  Right wrist pain      Subjective Assessment - 02/25/15 0851    Subjective Sorry I missed my appointment Monday.  Sore and stiff in the right arm   Currently in Pain? Yes   Pain Score 5    Pain Location Shoulder   Pain Orientation Right   Pain Descriptors / Indicators Aching   Aggravating Factors  work   Pain Relieving Factors rest                         [facility] Adult PT Treatment/Exercise - 02/25/15 0001    Neck Exercises:  Machines for Strengthening   UBE (Upper Arm Bike) 4 mins level 1   Cybex Row 15# 2x10   Cybex Chest Press serratus push 5# 2x10   Power Tower lats 15# 2x10   Other Machines for Production assistant, radio for hand and arm strength   Shoulder Exercises: ROM/Strengthening   "W" Arms 15 reps   X to V Arms 15 reps   Moist Heat Therapy   Number Minutes Moist Heat 15 Minutes   Moist Heat Location Shoulder   Electrical Stimulation   Electrical Stimulation Location right shoulder area   Electrical Stimulation Action IFC   Electrical Stimulation Parameters tolerance   Electrical Stimulation Goals Pain   Manual Therapy   Manual therapy comments PROM of the right arm and wrist all motions to end range stretch                  PT Short Term Goals - 02/25/15 0927    PT SHORT TERM GOAL #1   Title independent with initial HEP   Status On-going  PT Long Term Goals - 02/16/15 [id number]    PT LONG TERM GOAL #1   Title decrease pain 50%   Time 8   Period Weeks   Status New   PT LONG TERM GOAL #2   Title report 50% less difficulty at work   Time 8   Period Weeks   Status New   PT LONG TERM GOAL #3   Title increase right shoulder AROM to 140 degrees flexion   Time 8   Period Weeks   Status New   PT LONG TERM GOAL #4   Title increase right shoulder AROM to 70 degrees ER   Time 8   Period Weeks   Status New               Plan - 02/25/15 0926    Clinical Impression Statement Had difficulty with right grip on velcor board.  Some tightness of the right wrist extensors.   PT Next Visit Plan Add exercises and STM to the right shoulder   Consulted and Agree with Plan of Care Patient        Problem List Patient Active Problem List   Diagnosis Date Noted  . 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 Strain of left thumb 02/11/2015  . Strain of right forearm 02/11/2015  . Right shoulder pain 12/31/2014  . Injury of right little finger 12/31/2014  .  Episodic cluster headache, 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  . 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 Hypersomnia, recurrent 12/02/2014  . Migraine aura, persistent, intractable, with status migrainosus 12/02/2014  . Lower back injury 09/30/2014  . Bipolar I disorder, most recent episode depressed ([place])   . MDD (major depressive disorder), recurrent, severe, with psychosis ([place]) 09/23/2014  . Injury of left index finger 08/20/2014  . Right ankle sprain 06/01/2014  . Contusion, multiple sites 06/01/2014  . Strain of right gastrocnemius muscle 06/01/2014  . Phonophobia 05/04/2014  . Photophobia of both eyes 05/04/2014  . Emotionally unstable borderline personality disorder 05/04/2014  . Nausea with vomiting 05/04/2014  . Mixed bipolar I disorder ([place])   . Bipolar I disorder, most recent episode mixed ([place]) 04/18/2014  . Bipolar affective disorder, depressed, severe ([place]) 04/12/2014  . Suicidal ideation 04/12/2014  . Injury of right shoulder and upper arm 02/17/2014  . Left leg pain 06/03/2013  . Right hip pain 06/03/2013  . Migraine with status migrainosus 01/08/2013  . Personality disorder   . Chronic migraine 05/08/2012  . Contact dermatitis 11/27/2011  . Major depressive disorder, recurrent episode ([place]) 10/27/2011  . Generalized anxiety disorder 10/27/2011  . 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 Borderline personality disorder 10/27/2011  . Right foot pain 09/28/2011  . 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 Ganglion cyst 09/29/2009  . PES PLANUS 07/01/2008  . BIPOLAR DISORDER UNSPECIFIED 06/09/2008    Sumner Boast., PT 02/25/2015, 9:29 AM  [facility] [id number] [facility] [place], [place], [address] Phone: [phone]   Fax:  [phone]  Name: [name] MRN: [id number] Date of Birth: [birth year]

## 2015-03-01 ENCOUNTER — Ambulatory Visit: Payer: PPO | Admitting: Physical Therapy

## 2015-03-01 ENCOUNTER — Encounter: Payer: Self-pay | Admitting: Physical Therapy

## 2015-03-01 DIAGNOSIS — M25611 Stiffness of right shoulder, not elsewhere classified: Secondary | ICD-10-CM

## 2015-03-01 DIAGNOSIS — M25511 Pain in right shoulder: Secondary | ICD-10-CM | POA: Diagnosis not present

## 2015-03-01 DIAGNOSIS — M25531 Pain in right wrist: Secondary | ICD-10-CM

## 2015-03-01 NOTE — Therapy (Signed)
[place] [address] [place] [place], [place], [address] Phone: [phone]   Fax:  [phone]  Physical Therapy Treatment  Patient Details  Name: [name] MRN: [id number] Date of Birth: [birth year] Referring Provider: Barbaraann Barthel  Encounter Date: 03/01/2015      PT End of Session - 03/01/15 1521    Visit Number 3   Date for PT Re-Evaluation 04/16/15   PT Start Time 1445   PT Stop Time 1541   PT Time Calculation (min) 56 min   Activity Tolerance Patient tolerated treatment well   Behavior During Therapy [facility] for tasks assessed/performed      Past Medical History  Diagnosis Date  . Bipolar disorder ([place])   . Eczema   . Pes planus   . Achilles tendinitis   . Hypertension   . Migraine   . Achilles tendinitis   . Ganglion cyst 09/29/2009    left wrist (2 cyst)  . Arthritis   . Allergy   . Lipoma   . ADHD (attention deficit hyperactivity disorder)   . Hyperprolactinemia ([place])   . Bipolar affective ([place])   . Personality disorder     Past Surgical History  Procedure Laterality Date  . Tumor resection left thigh    . Ankle surgery  12/88    left   . Lipoma removal    . Ganglion cyst excision  2011  . Chest nodule  1990?    rt chest wall nodule removal  . Shoulder surgery  01/13/2011    right, partial tear  . Right bunioectomy      There were no vitals filed for this visit.  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   Currently in Pain? Yes   Pain Score 4    Pain Location Shoulder  also c/o some pain in the right wrist and hand   Pain Orientation Right                         [facility] Adult PT Treatment/Exercise - 03/01/15 0001    Neck Exercises: Machines for Strengthening   UBE (Upper  Arm Bike) 4 mins level 1   Cybex Row 15# 2x10   Cybex Chest Press serratus push 5# 2x10   Power Tower lats 15# 2x10   Other Machines for Production assistant, radio for hand and arm strength   Moist Heat Therapy   Number Minutes Moist Heat 15 Minutes   Moist Heat Location Shoulder   Electrical Stimulation   Electrical Stimulation Location right shoulder/right elbow   Electrical Stimulation Action premod   Electrical Stimulation Parameters tolerance   Electrical Stimulation Goals Pain   Manual Therapy   Manual therapy comments PROM of the right arm and wrist all motions to end range stretch   Soft tissue mobilization STM to the right elbow, wrist extensor mm bellies, CFM                  PT Short Term Goals - 02/25/15 [id number]    PT SHORT TERM GOAL #1   Title independent with initial HEP   Status On-going           PT Long Term Goals - 02/16/15 [id number]  PT LONG TERM GOAL #1   Title decrease pain 50%   Time 8   Period Weeks   Status New   PT LONG TERM GOAL #2   Title report 50% less difficulty at work   Time 8   Period Weeks   Status New   PT LONG TERM GOAL #3   Title increase right shoulder AROM to 140 degrees flexion   Time 8   Period Weeks   Status New   PT LONG TERM GOAL #4   Title increase right shoulder AROM to 70 degrees ER   Time 8   Period Weeks   Status New               Plan - 03/01/15 1522    Clinical Impression Statement right wrist extensor mm bellies are sore and tight, has difficulty with velcro board   PT Next Visit Plan Add exercises and STM to the right shoulder   Consulted and Agree with Plan of Care Patient        Problem List Patient Active Problem List   Diagnosis Date Noted  . 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 Strain of left thumb 02/11/2015  . Strain of right forearm 02/11/2015  . Right shoulder pain 12/31/2014  . Injury of right little finger 12/31/2014  . Episodic cluster headache, not intractable  12/02/2014  . 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 Hypersomnia, recurrent 12/02/2014  . Migraine aura, persistent, intractable, with status migrainosus 12/02/2014  . Lower back injury 09/30/2014  . Bipolar I disorder, most recent episode depressed ([place])   . MDD (major depressive disorder), recurrent, severe, with psychosis ([place]) 09/23/2014  . Injury of left index finger 08/20/2014  . Right ankle sprain 06/01/2014  . Contusion, multiple sites 06/01/2014  . Strain of right gastrocnemius muscle 06/01/2014  . Phonophobia 05/04/2014  . Photophobia of both eyes 05/04/2014  . Emotionally unstable borderline personality disorder 05/04/2014  . Nausea with vomiting 05/04/2014  . Mixed bipolar I disorder ([place])   . Bipolar I disorder, most recent episode mixed ([place]) 04/18/2014  . Bipolar affective disorder, depressed, severe ([place]) 04/12/2014  . Suicidal ideation 04/12/2014  . Injury of right shoulder and upper arm 02/17/2014  . Left leg pain 06/03/2013  . Right hip pain 06/03/2013  . Migraine with status migrainosus 01/08/2013  . Personality disorder   . Chronic migraine 05/08/2012  . Contact dermatitis 11/27/2011  . Major depressive disorder, recurrent episode ([place]) 10/27/2011  . Generalized anxiety disorder 10/27/2011  . 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 Borderline personality disorder 10/27/2011  . Right foot pain 09/28/2011  . 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 Ganglion cyst 09/29/2009  . PES PLANUS 07/01/2008  . BIPOLAR DISORDER UNSPECIFIED 06/09/2008    Sumner Boast., PT 03/01/2015, 3:24 PM  [address] [place], [place], [address] Phone: [phone]   Fax:  [phone]  Name: [name] MRN: [id number] Date of Birth:  September 26, 1970

## 2015-03-05 ENCOUNTER — Ambulatory Visit: Payer: PPO | Attending: Family Medicine | Admitting: Physical Therapy

## 2015-03-05 ENCOUNTER — Encounter: Payer: Self-pay | Admitting: Physical Therapy

## 2015-03-05 DIAGNOSIS — M25611 Stiffness of right shoulder, not elsewhere classified: Secondary | ICD-10-CM

## 2015-03-05 DIAGNOSIS — M25531 Pain in right wrist: Secondary | ICD-10-CM | POA: Diagnosis not present

## 2015-03-05 DIAGNOSIS — M25511 Pain in right shoulder: Secondary | ICD-10-CM | POA: Diagnosis not present

## 2015-03-05 NOTE — Therapy (Signed)
[place], [place], [address] Phone: [phone]   Fax:  [phone]  Physical Therapy Treatment  Patient Details  Name: [name] MRN: [id number] Date of Birth: August 19, 1970 Referring Provider: Barbaraann Barthel  Encounter Date: 03/05/2015      PT End of Session - 03/05/15 0859    Visit Number 4   Date for PT Re-Evaluation 04/16/15   PT Start Time 0757   PT Stop Time 0900   PT Time Calculation (min) 63 min   Activity Tolerance Patient tolerated treatment well   Behavior During Therapy [facility] for tasks assessed/performed      Past Medical History  Diagnosis Date  . Bipolar disorder ([place])   . Eczema   . Pes planus   . Achilles tendinitis   . Hypertension   . Migraine   . Achilles tendinitis   . Ganglion cyst 09/29/2009    left wrist (2 cyst)  . Arthritis   . Allergy   . Lipoma   . ADHD (attention deficit hyperactivity disorder)   . Hyperprolactinemia ([place])   . Bipolar affective ([place])   . Personality disorder     Past Surgical History  Procedure Laterality Date  . Tumor resection left thigh    . Ankle surgery  12/88    left   . Lipoma removal    . Ganglion cyst excision  2011  . Chest nodule  1990?    rt chest wall nodule removal  . Shoulder surgery  01/13/2011    right, partial tear  . Right bunioectomy      There were no vitals filed for this visit.  Visit Diagnosis:  Right shoulder pain  Stiffness of right shoulder joint  Right wrist pain      Subjective Assessment - 03/05/15 0844    Subjective My wrist is sore.     Currently in Pain? Yes   Pain Score 4    Pain Location Wrist   Pain Orientation Right   Pain Descriptors / Indicators Aching   Aggravating Factors  work   Pain Relieving Factors treatment            [facility] PT Assessment - 03/05/15 0001    AROM   Right Shoulder Flexion 115 Degrees   Right Shoulder ABduction 90 Degrees                      [facility] Adult PT Treatment/Exercise - 03/05/15 0001    Neck Exercises: Machines for Strengthening   UBE (Upper Arm Bike) 4 mins level 1   Cybex Row 15# 2x10   Cybex Chest Press serratus push 5# 2x10   Power Tower lats 15# 2x10   Other Machines for Strengthening 5# pulley scapular stabilization, hammer pro/sup and RD/UD   Primary school teacher for Production assistant, radio for hand and arm strength   Shoulder Exercises: ROM/Strengthening   "W" Arms 15 reps   X to V Arms 15 reps   Moist Heat Therapy   Number Minutes Moist Heat 15 Minutes   Moist Heat Location Shoulder   Electrical Stimulation   Electrical Stimulation Location right shoulder/right elbow   Electrical Stimulation Action prmod   Electrical Stimulation Parameters tolerance   Electrical Stimulation Goals Pain   Manual Therapy   Manual therapy comments PROM of the right arm and wrist all motions to end range stretch   Soft tissue mobilization STM to the right elbow, wrist extensor mm bellies,  CFM                  PT Short Term Goals - 03/05/15 0901    PT SHORT TERM GOAL #1   Title independent with initial HEP   Status Achieved           PT Long Term Goals - 02/16/15 [id number]    PT LONG TERM GOAL #1   Title decrease pain 50%   Time 8   Period Weeks   Status New   PT LONG TERM GOAL #2   Title report 50% less difficulty at work   Time 8   Period Weeks   Status New   PT LONG TERM GOAL #3   Title increase right shoulder AROM to 140 degrees flexion   Time 8   Period Weeks   Status New   PT LONG TERM GOAL #4   Title increase right shoulder AROM to 70 degrees ER   Time 8   Period Weeks   Status New               Plan - 03/05/15 0900    Clinical Impression Statement Patient with right wrist extensor pain.  She is sore and tender around the lateral epicondyle.   PT Next Visit Plan Add exercises and STM to the right shoulder   Consulted and Agree with Plan of Care  Patient        Problem List Patient Active Problem List   Diagnosis Date Noted  . 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 Strain of left thumb 02/11/2015  . Strain of right forearm 02/11/2015  . Right shoulder pain 12/31/2014  . Injury of right little finger 12/31/2014  . Episodic cluster headache, 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  . 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 Hypersomnia, recurrent 12/02/2014  . Migraine aura, persistent, intractable, with status migrainosus 12/02/2014  . Lower back injury 09/30/2014  . Bipolar I disorder, most recent episode depressed ([place])   . MDD (major depressive disorder), recurrent, severe, with psychosis ([place]) 09/23/2014  . Injury of left index finger 08/20/2014  . Right ankle sprain 06/01/2014  . Contusion, multiple sites 06/01/2014  . Strain of right gastrocnemius muscle 06/01/2014  . Phonophobia 05/04/2014  . Photophobia of both eyes 05/04/2014  . Emotionally unstable borderline personality disorder 05/04/2014  . Nausea with vomiting 05/04/2014  . Mixed bipolar I disorder ([place])   . Bipolar I disorder, most recent episode mixed ([place]) 04/18/2014  . Bipolar affective disorder, depressed, severe ([place]) 04/12/2014  . Suicidal ideation 04/12/2014  . Injury of right shoulder and upper arm 02/17/2014  . Left leg pain 06/03/2013  . Right hip pain 06/03/2013  . Migraine with status migrainosus 01/08/2013  . Personality disorder   . Chronic migraine 05/08/2012  . Contact dermatitis 11/27/2011  . Major depressive disorder, recurrent episode ([place]) 10/27/2011  . Generalized anxiety disorder 10/27/2011  . 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 Borderline personality disorder 10/27/2011  . Right foot pain 09/28/2011  . 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 Ganglion cyst 09/29/2009  . PES  PLANUS 07/01/2008  . BIPOLAR DISORDER UNSPECIFIED 06/09/2008    Sumner Boast., PT 03/05/2015, 9:02 AM  [address] [place], [place], [address] Phone: [phone]   Fax:  [phone]  Name: FATIMAH KROLCZYK MRN:  [id number] Date of Birth: 08/23/1970

## 2015-03-08 ENCOUNTER — Ambulatory Visit: Payer: PPO | Admitting: Physical Therapy

## 2015-03-08 ENCOUNTER — Encounter: Payer: Self-pay | Admitting: Physical Therapy

## 2015-03-08 DIAGNOSIS — M25511 Pain in right shoulder: Secondary | ICD-10-CM

## 2015-03-08 DIAGNOSIS — M25531 Pain in right wrist: Secondary | ICD-10-CM

## 2015-03-08 DIAGNOSIS — M25611 Stiffness of right shoulder, not elsewhere classified: Secondary | ICD-10-CM

## 2015-03-08 NOTE — Therapy (Signed)
[place] [address] [place], [place], [address] Phone: [phone]   Fax:  [phone]  Physical Therapy Treatment  Patient Details  Name: [name] MRN: [id number] Date of Birth: July 24, 1970 Referring Provider: Barbaraann Barthel  Encounter Date: 03/08/2015      PT End of Session - 03/08/15 1439    Visit Number 5   Date for PT Re-Evaluation 04/16/15   PT Start Time [id number]   PT Stop Time 1453   PT Time Calculation (min) 58 min   Activity Tolerance Patient tolerated treatment well   Behavior During Therapy [facility] for tasks assessed/performed      Past Medical History  Diagnosis Date  . Bipolar disorder ([place])   . Eczema   . Pes planus   . Achilles tendinitis   . Hypertension   . Migraine   . Achilles tendinitis   . Ganglion cyst 09/29/2009    left wrist (2 cyst)  . Arthritis   . Allergy   . Lipoma   . ADHD (attention deficit hyperactivity disorder)   . Hyperprolactinemia ([place])   . Bipolar affective ([place])   . Personality disorder     Past Surgical History  Procedure Laterality Date  . Tumor resection left thigh    . Ankle surgery  12/88    left   . Lipoma removal    . Ganglion cyst excision  2011  . Chest nodule  1990?    rt chest wall nodule removal  . Shoulder surgery  01/13/2011    right, partial tear  . Right bunioectomy      There were no vitals filed for this visit.  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 Yes   Pain Score 5    Pain Location Elbow   Pain Orientation Right   Pain Descriptors / Indicators Aching;Sore                         [facility] Adult PT Treatment/Exercise - 03/08/15 0001    Neck Exercises: Machines for Strengthening   UBE (Upper Arm Bike) 4  mins level 4   Cybex Row 20# 2x15   Cybex Chest Press serratus push 5# 2x10   Power Tower lats 20# 2x15   Other Machines for Strengthening 5# pulley scapular stabilization, hammer pro/sup and RD/UD   Other Machines for Child psychotherapist board for hand and arm strength   Shoulder Exercises: ROM/Strengthening   "W" Arms 15 reps   X to V Arms 15 reps   Moist Heat Therapy   Number Minutes Moist Heat 15 Minutes   Moist Heat Location Shoulder   Electrical Stimulation   Electrical Stimulation Location right shoulder/right elbow   Electrical Stimulation Action premod   Electrical Stimulation Parameters tolerance   Electrical Stimulation Goals Pain   Manual Therapy   Manual therapy comments PROM of the right arm and wrist all motions to end range stretch   Soft tissue mobilization STM to the right elbow, wrist extensor mm bellies, CFM                  PT Short Term Goals - 03/05/15 0901    PT SHORT TERM GOAL #1   Title  independent with initial HEP   Status Achieved           PT Long Term Goals - 02/16/15 [id number]    PT LONG TERM GOAL #1   Title decrease pain 50%   Time 8   Period Weeks   Status New   PT LONG TERM GOAL #2   Title report 50% less difficulty at work   Time 8   Period Weeks   Status New   PT LONG TERM GOAL #3   Title increase right shoulder AROM to 140 degrees flexion   Time 8   Period Weeks   Status New   PT LONG TERM GOAL #4   Title increase right shoulder AROM to 70 degrees ER   Time 8   Period Weeks   Status New               Plan - 03/08/15 1440    Clinical Impression Statement Patient very tender in the right elbow, wrist extensors and in the right anterior shoulder.  PROM is WNL's with pain   PT Next Visit Plan progress exercise as tolerated, could try ionto   Consulted and Agree with Plan of Care Patient        Problem List Patient Active Problem List   Diagnosis Date Noted  . Insomnia due to mental disorder 02/17/2015   . Strain of left thumb 02/11/2015  . Strain of right forearm 02/11/2015  . Right shoulder pain 12/31/2014  . Injury of right little finger 12/31/2014  . Episodic cluster headache, 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  . 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 Hypersomnia, recurrent 12/02/2014  . Migraine aura, persistent, intractable, with status migrainosus 12/02/2014  . Lower back injury 09/30/2014  . Bipolar I disorder, most recent episode depressed ([place])   . MDD (major depressive disorder), recurrent, severe, with psychosis ([place]) 09/23/2014  . Injury of left index finger 08/20/2014  . Right ankle sprain 06/01/2014  . Contusion, multiple sites 06/01/2014  . Strain of right gastrocnemius muscle 06/01/2014  . Phonophobia 05/04/2014  . Photophobia of both eyes 05/04/2014  . Emotionally unstable borderline personality disorder 05/04/2014  . Nausea with vomiting 05/04/2014  . Mixed bipolar I disorder ([place])   . Bipolar I disorder, most recent episode mixed ([place]) 04/18/2014  . Bipolar affective disorder, depressed, severe ([place]) 04/12/2014  . Suicidal ideation 04/12/2014  . Injury of right shoulder and upper arm 02/17/2014  . Left leg pain 06/03/2013  . Right hip pain 06/03/2013  . Migraine with status migrainosus 01/08/2013  . Personality disorder   . Chronic migraine 05/08/2012  . Contact dermatitis 11/27/2011  . Major depressive disorder, recurrent episode ([place]) 10/27/2011  . Generalized anxiety disorder 10/27/2011  . 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 Borderline personality disorder 10/27/2011  . Right foot pain 09/28/2011  . 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 Ganglion cyst 09/29/2009  . PES PLANUS 07/01/2008  . BIPOLAR DISORDER UNSPECIFIED 06/09/2008    Sumner Boast., PT 03/08/2015, 2:42 PM  [address] [place], [place], [address] Phone: [phone]   Fax:  [phone]  Name: [name] MRN: [id number] Date of Birth: 04-25-1970

## 2015-03-11 ENCOUNTER — Encounter: Payer: Self-pay | Admitting: Physical Therapy

## 2015-03-11 ENCOUNTER — Ambulatory Visit: Payer: PPO | Admitting: Physical Therapy

## 2015-03-11 DIAGNOSIS — M25531 Pain in right wrist: Secondary | ICD-10-CM

## 2015-03-11 DIAGNOSIS — M25511 Pain in right shoulder: Secondary | ICD-10-CM | POA: Diagnosis not present

## 2015-03-11 DIAGNOSIS — M25611 Stiffness of right shoulder, not elsewhere classified: Secondary | ICD-10-CM

## 2015-03-11 NOTE — Therapy (Signed)
[place], [place], [address] Phone: [phone]   Fax:  [phone]  Physical Therapy Treatment  Patient Details  Name: [name] MRN: [id number] Date of Birth: 1970-06-13 Referring Provider: Barbaraann Barthel  Encounter Date: 03/11/2015      PT End of Session - 03/11/15 0842    Visit Number 6   Date for PT Re-Evaluation 04/16/15   PT Start Time 0800   PT Stop Time 0900   PT Time Calculation (min) 60 min   Activity Tolerance Patient tolerated treatment well   Behavior During Therapy [facility] for tasks assessed/performed      Past Medical History  Diagnosis Date  . Bipolar disorder ([place])   . Eczema   . Pes planus   . Achilles tendinitis   . Hypertension   . Migraine   . Achilles tendinitis   . Ganglion cyst 09/29/2009    left wrist (2 cyst)  . Arthritis   . Allergy   . Lipoma   . ADHD (attention deficit hyperactivity disorder)   . Hyperprolactinemia ([place])   . Bipolar affective ([place])   . Personality disorder     Past Surgical History  Procedure Laterality Date  . Tumor resection left thigh    . Ankle surgery  12/88    left   . Lipoma removal    . Ganglion cyst excision  2011  . Chest nodule  1990?    rt chest wall nodule removal  . Shoulder surgery  01/13/2011    right, partial tear  . Right bunioectomy      There were no vitals filed for this visit.  Visit Diagnosis:  Right shoulder pain  Stiffness of right shoulder joint  Right wrist pain      Subjective Assessment - 03/11/15 0806    Subjective I was doing pretty good until last night.  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   Currently in Pain? Yes   Pain Score 5    Pain Location Elbow            [facility] PT Assessment - 03/11/15 0001    AROM   Right Shoulder Flexion 120 Degrees   Right Shoulder ABduction 95 Degrees   Right Shoulder Internal Rotation  38 Degrees   Right Shoulder External Rotation 60 Degrees                     [facility] Adult PT Treatment/Exercise - 03/11/15 0001    Neck Exercises: Machines for Strengthening   UBE (Upper Arm Bike) 4 mins level 4   Cybex Chest Press wall push ups two ways   Other Machines for Strengthening 5# pulley scapular stabilization, hammer pro/sup and RD/UD   Other Machines for Strengthening velcro board for hand and arm strength   Modalities   Modalities Iontophoresis   Electrical Stimulation   Electrical Stimulation Location right elbow   Electrical Stimulation Action IFC   Electrical Stimulation Parameters tolerance   Electrical Stimulation Goals Pain   Iontophoresis   Type of Iontophoresis Dexamethasone   Location right elbow   Dose 25mA   Time 4 hour patch   Manual Therapy   Manual therapy comments PROM of the right arm and wrist all motions to end range stretch   Soft tissue mobilization STM to the right elbow, wrist extensor mm bellies, CFM  PT Short Term Goals - 03/05/15 0901    PT SHORT TERM GOAL #1   Title independent with initial HEP   Status Achieved           PT Long Term Goals - 03/11/15 0844    PT LONG TERM GOAL #1   Title decrease pain 50%   Status On-going   PT LONG TERM GOAL #2   Title report 50% less difficulty at work   Status On-going               Plan - 03/11/15 0843    Clinical Impression Statement Patient remains with biggest c/o right lateral elbow pain and tenderness.  She has improved ROM of the shoulder.   PT Next Visit Plan see if ionto helped   Consulted and Agree with Plan of Care Patient        Problem List Patient Active Problem List   Diagnosis Date Noted  . 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 Strain of left thumb 02/11/2015  . Strain of right forearm 02/11/2015  . Right shoulder pain 12/31/2014  . Injury of right little finger 12/31/2014  . Episodic cluster headache, not  intractable 12/02/2014  . 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 Hypersomnia, recurrent 12/02/2014  . Migraine aura, persistent, intractable, with status migrainosus 12/02/2014  . Lower back injury 09/30/2014  . Bipolar I disorder, most recent episode depressed ([place])   . MDD (major depressive disorder), recurrent, severe, with psychosis ([place]) 09/23/2014  . Injury of left index finger 08/20/2014  . Right ankle sprain 06/01/2014  . Contusion, multiple sites 06/01/2014  . Strain of right gastrocnemius muscle 06/01/2014  . Phonophobia 05/04/2014  . Photophobia of both eyes 05/04/2014  . Emotionally unstable borderline personality disorder 05/04/2014  . Nausea with vomiting 05/04/2014  . Mixed bipolar I disorder ([place])   . Bipolar I disorder, most recent episode mixed ([place]) 04/18/2014  . Bipolar affective disorder, depressed, severe ([place]) 04/12/2014  . Suicidal ideation 04/12/2014  . Injury of right shoulder and upper arm 02/17/2014  . Left leg pain 06/03/2013  . Right hip pain 06/03/2013  . Migraine with status migrainosus 01/08/2013  . Personality disorder   . Chronic migraine 05/08/2012  . Contact dermatitis 11/27/2011  . Major depressive disorder, recurrent episode ([place]) 10/27/2011  . Generalized anxiety disorder 10/27/2011  . 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 Borderline personality disorder 10/27/2011  . Right foot pain 09/28/2011  . 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 Ganglion cyst 09/29/2009  . PES PLANUS 07/01/2008  . BIPOLAR DISORDER UNSPECIFIED 06/09/2008    Sumner Boast., PT 03/11/2015, 8:46 AM  [address] [place], [place], [address] Phone: [phone]   Fax:  [phone]  Name: NITRA DSOUZA MRN: [id number] Date of  Birth: 02-18-1970

## 2015-03-15 ENCOUNTER — Encounter: Payer: Self-pay | Admitting: Physical Therapy

## 2015-03-15 ENCOUNTER — Ambulatory Visit: Payer: PPO | Admitting: Physical Therapy

## 2015-03-15 DIAGNOSIS — M25611 Stiffness of right shoulder, not elsewhere classified: Secondary | ICD-10-CM

## 2015-03-15 DIAGNOSIS — M25531 Pain in right wrist: Secondary | ICD-10-CM

## 2015-03-15 DIAGNOSIS — M25511 Pain in right shoulder: Secondary | ICD-10-CM

## 2015-03-15 NOTE — Therapy (Signed)
[facility] [place] [place], [place], [address] Phone: [phone]   Fax:  [phone]  Physical Therapy Treatment  Patient Details  Name: [name] MRN: [id number] Date of Birth: [birth year] Referring Provider: Barbaraann Barthel  Encounter Date: 03/15/2015      PT End of Session - 03/15/15 1426    Visit Number 7   Date for PT Re-Evaluation 04/16/15   PT Start Time [id number]   PT Stop Time 1448   PT Time Calculation (min) 50 min   Activity Tolerance Patient tolerated treatment well   Behavior During Therapy [facility] for tasks assessed/performed      Past Medical History  Diagnosis Date  . Bipolar disorder ([place])   . Eczema   . Pes planus   . Achilles tendinitis   . Hypertension   . Migraine   . Achilles tendinitis   . Ganglion cyst 09/29/2009    left wrist (2 cyst)  . Arthritis   . Allergy   . Lipoma   . ADHD (attention deficit hyperactivity disorder)   . Hyperprolactinemia ([place])   . Bipolar affective ([place])   . Personality disorder     Past Surgical History  Procedure Laterality Date  . Tumor resection left thigh    . Ankle surgery  12/88    left   . Lipoma removal    . Ganglion cyst excision  2011  . Chest nodule  1990?    rt chest wall nodule removal  . Shoulder surgery  01/13/2011    right, partial tear  . Right bunioectomy      There were no vitals filed for this visit.  Visit Diagnosis:  Right shoulder pain  Stiffness of right shoulder joint  Right wrist pain      Subjective Assessment - 03/15/15 1424    Subjective I had a little reaction to the patch, itchy and redness.  Worked over the weekend with some increased pain   Currently in Pain? Yes   Pain Score 4    Pain Location Elbow   Pain Orientation Right                         [facility] Adult PT Treatment/Exercise - 03/15/15 0001    Electrical Stimulation   Electrical Stimulation Location right elbow and right  shoulder   Electrical Stimulation Action Premod   Electrical Stimulation Parameters tolerance   Electrical Stimulation Goals Pain   Manual Therapy   Manual therapy comments PROM of the right arm and wrist all motions to end range stretch   Soft tissue mobilization STM to the right elbow, wrist extensor mm bellies, CFM, use of ice probe to work on the lateral epicondyle                  PT Short Term Goals - 03/05/15 0901    PT SHORT TERM GOAL #1   Title independent with initial HEP   Status Achieved           PT Long Term Goals - 03/11/15 0844    PT LONG TERM GOAL #1   Title decrease pain 50%   Status On-going   PT LONG TERM GOAL #2   Title report 50% less difficulty at work   Status On-going               Plan - 03/15/15 1427    Clinical Impression Statement She has a visible rash  in round patches where the ionto patch was.  She reports itching.  She is tender in the right wrist extensors and very tight today   PT Next Visit Plan will stop ionto due to rash, and continue to address the wrist and elbow   Consulted and Agree with Plan of Care Patient        Problem List Patient Active Problem List   Diagnosis Date Noted  . 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 Strain of left thumb 02/11/2015  . Strain of right forearm 02/11/2015  . Right shoulder pain 12/31/2014  . Injury of right little finger 12/31/2014  . Episodic cluster headache, 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  . 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 Hypersomnia, recurrent 12/02/2014  . Migraine aura, persistent, intractable, with status migrainosus 12/02/2014  . Lower back injury 09/30/2014  . Bipolar I disorder, most recent episode depressed ([place])   . MDD (major depressive disorder), recurrent, severe, with psychosis ([place]) 09/23/2014  . Injury of left index finger 08/20/2014  . Right ankle sprain 06/01/2014  .  Contusion, multiple sites 06/01/2014  . Strain of right gastrocnemius muscle 06/01/2014  . Phonophobia 05/04/2014  . Photophobia of both eyes 05/04/2014  . Emotionally unstable borderline personality disorder 05/04/2014  . Nausea with vomiting 05/04/2014  . Mixed bipolar I disorder ([place])   . Bipolar I disorder, most recent episode mixed ([place]) 04/18/2014  . Bipolar affective disorder, depressed, severe ([place]) 04/12/2014  . Suicidal ideation 04/12/2014  . Injury of right shoulder and upper arm 02/17/2014  . Left leg pain 06/03/2013  . Right hip pain 06/03/2013  . Migraine with status migrainosus 01/08/2013  . Personality disorder   . Chronic migraine 05/08/2012  . Contact dermatitis 11/27/2011  . Major depressive disorder, recurrent episode ([place]) 10/27/2011  . Generalized anxiety disorder 10/27/2011  . 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 Borderline personality disorder 10/27/2011  . Right foot pain 09/28/2011  . 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 Ganglion cyst 09/29/2009  . PES PLANUS 07/01/2008  . BIPOLAR DISORDER UNSPECIFIED 06/09/2008    Sumner Boast., PT 03/15/2015, 2:31 PM  [address] [place], [place], [address] Phone: [phone]   Fax:  [phone]  Name: [name] MRN: [id number] Date of Birth: 1970/09/27

## 2015-03-18 ENCOUNTER — Ambulatory Visit: Payer: PPO | Admitting: Physical Therapy

## 2015-03-18 ENCOUNTER — Encounter: Payer: Self-pay | Admitting: Physical Therapy

## 2015-03-18 DIAGNOSIS — H25813 Combined forms of age-related cataract, bilateral: Secondary | ICD-10-CM | POA: Diagnosis not present

## 2015-03-18 DIAGNOSIS — M25511 Pain in right shoulder: Secondary | ICD-10-CM

## 2015-03-18 DIAGNOSIS — M25531 Pain in right wrist: Secondary | ICD-10-CM

## 2015-03-18 DIAGNOSIS — H31002 Unspecified chorioretinal scars, left eye: Secondary | ICD-10-CM | POA: Diagnosis not present

## 2015-03-18 DIAGNOSIS — H524 Presbyopia: Secondary | ICD-10-CM | POA: Diagnosis not present

## 2015-03-18 DIAGNOSIS — H43813 Vitreous degeneration, bilateral: Secondary | ICD-10-CM | POA: Diagnosis not present

## 2015-03-18 DIAGNOSIS — M25611 Stiffness of right shoulder, not elsewhere classified: Secondary | ICD-10-CM

## 2015-03-18 NOTE — Therapy (Signed)
[place], [place], [address] Phone: [phone]   Fax:  [phone]  Physical Therapy Treatment  Patient Details  Name: [name] MRN: [id number] Date of Birth: April 07, 1970 Referring Provider: Barbaraann Barthel  Encounter Date: 03/18/2015      PT End of Session - 03/18/15 1340    Visit Number 8   Date for PT Re-Evaluation 04/16/15   PT Start Time 1259   PT Stop Time 9311   PT Time Calculation (min) 56 min   Activity Tolerance Patient tolerated treatment well   Behavior During Therapy [facility] for tasks assessed/performed      Past Medical History  Diagnosis Date  . Bipolar disorder ([place])   . Eczema   . Pes planus   . Achilles tendinitis   . Hypertension   . Migraine   . Achilles tendinitis   . Ganglion cyst 09/29/2009    left wrist (2 cyst)  . Arthritis   . Allergy   . Lipoma   . ADHD (attention deficit hyperactivity disorder)   . Hyperprolactinemia ([place])   . Bipolar affective ([place])   . Personality disorder     Past Surgical History  Procedure Laterality Date  . Tumor resection left thigh    . Ankle surgery  12/88    left   . Lipoma removal    . Ganglion cyst excision  2011  . Chest nodule  1990?    rt chest wall nodule removal  . Shoulder surgery  01/13/2011    right, partial tear  . Right bunioectomy      There were no vitals filed for this visit.  Visit Diagnosis:  Right shoulder pain  Stiffness of right shoulder joint  Right wrist pain      Subjective Assessment - 03/18/15 1306    Subjective I was carrying groceries and hit my elbow on the doorknob, really sore now.   Currently in Pain? Yes   Pain Score 6    Pain Location Elbow   Pain Orientation Right;Lateral   Pain Descriptors / Indicators Aching;Sore   Pain Relieving Factors ice seemed to help            [facility] PT Assessment - 03/18/15 0001    AROM   Right Shoulder Flexion 120 Degrees   Right  Shoulder ABduction 100 Degrees   Right Shoulder Internal Rotation 40 Degrees   Right Shoulder External Rotation 60 Degrees                     [facility] Adult PT Treatment/Exercise - 03/18/15 0001    Electrical Stimulation   Electrical Stimulation Location right elbow and right shoulder   Electrical Stimulation Action premod   Electrical Stimulation Parameters tolerance   Electrical Stimulation Goals Pain   Manual Therapy   Manual therapy comments PROM of the right arm and wrist all motions to end range stretch, PROM of the right shoulder   Soft tissue mobilization CFM to the right wrist extensors, use of ice probe, STM to the right rhomboids, upper trap and into the cervical area                  PT Short Term Goals - 03/05/15 0901    PT SHORT TERM GOAL #1   Title independent with initial HEP   Status Achieved           PT Long Term Goals - 03/18/15 1343  PT LONG TERM GOAL #1   Title decrease pain 50%   Status On-going   PT LONG TERM GOAL #2   Title report 50% less difficulty at work   Status On-going   PT LONG TERM GOAL #3   Title increase right shoulder AROM to 140 degrees flexion   Status Partially Met   PT LONG TERM GOAL #4   Title increase right shoulder AROM to 70 degrees ER   Status On-going               Plan - 03/18/15 1342    Clinical Impression Statement Rash has almost dissappeared.  She is very tender in the right upper trap and rhomboids with significant spasms today.  The shoulder ROM continues to improve.     PT Next Visit Plan See if the increased STM helped, she seems to have some pain due to work   [employer] and Agree with Plan of Care Patient        Problem List Patient Active Problem List   Diagnosis Date Noted  . 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 Strain of left thumb 02/11/2015  . Strain of right forearm 02/11/2015  . Right shoulder pain 12/31/2014  . Injury of right little finger 12/31/2014  .  Episodic cluster headache, 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  . 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 Hypersomnia, recurrent 12/02/2014  . Migraine aura, persistent, intractable, with status migrainosus 12/02/2014  . Lower back injury 09/30/2014  . Bipolar I disorder, most recent episode depressed ([place])   . MDD (major depressive disorder), recurrent, severe, with psychosis ([place]) 09/23/2014  . Injury of left index finger 08/20/2014  . Right ankle sprain 06/01/2014  . Contusion, multiple sites 06/01/2014  . Strain of right gastrocnemius muscle 06/01/2014  . Phonophobia 05/04/2014  . Photophobia of both eyes 05/04/2014  . Emotionally unstable borderline personality disorder 05/04/2014  . Nausea with vomiting 05/04/2014  . Mixed bipolar I disorder ([place])   . Bipolar I disorder, most recent episode mixed ([place]) 04/18/2014  . Bipolar affective disorder, depressed, severe ([place]) 04/12/2014  . Suicidal ideation 04/12/2014  . Injury of right shoulder and upper arm 02/17/2014  . Left leg pain 06/03/2013  . Right hip pain 06/03/2013  . Migraine with status migrainosus 01/08/2013  . Personality disorder   . Chronic migraine 05/08/2012  . Contact dermatitis 11/27/2011  . Major depressive disorder, recurrent episode ([place]) 10/27/2011  . Generalized anxiety disorder 10/27/2011  . 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 Borderline personality disorder 10/27/2011  . Right foot pain 09/28/2011  . 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 Ganglion cyst 09/29/2009  . PES PLANUS 07/01/2008  . BIPOLAR DISORDER UNSPECIFIED 06/09/2008    Sumner Boast,. PT 03/18/2015, 1:46 PM  [address] [place], [place], [address] Phone: [phone]   Fax:  [phone]  Name: [name] MRN: [id number] Date of Birth: 21-Aug-1970

## 2015-03-22 ENCOUNTER — Encounter: Payer: Self-pay | Admitting: Physical Therapy

## 2015-03-22 ENCOUNTER — Ambulatory Visit: Payer: PPO | Admitting: Physical Therapy

## 2015-03-22 ENCOUNTER — Telehealth: Payer: Self-pay | Admitting: Family Medicine

## 2015-03-22 ENCOUNTER — Encounter: Payer: Self-pay | Admitting: Family Medicine

## 2015-03-22 ENCOUNTER — Ambulatory Visit (INDEPENDENT_AMBULATORY_CARE_PROVIDER_SITE_OTHER): Payer: PPO | Admitting: Family Medicine

## 2015-03-22 VITALS — BP 123/85 | HR 64 | Ht 70.0 in | Wt 251.0 lb

## 2015-03-22 DIAGNOSIS — S56911D Strain of unspecified muscles, fascia and tendons at forearm level, right arm, subsequent encounter: Secondary | ICD-10-CM

## 2015-03-22 DIAGNOSIS — M25511 Pain in right shoulder: Secondary | ICD-10-CM | POA: Diagnosis not present

## 2015-03-22 DIAGNOSIS — M25531 Pain in right wrist: Secondary | ICD-10-CM

## 2015-03-22 DIAGNOSIS — M25611 Stiffness of right shoulder, not elsewhere classified: Secondary | ICD-10-CM

## 2015-03-22 NOTE — Telephone Encounter (Signed)
Patient came by and picked up brace

## 2015-03-22 NOTE — Patient Instructions (Signed)
You have a forearm strain with lateral epicondylitis. Ibuprofen 800mg  three times a day with food for pain and inflammation. Can take tylenol in addition to this. Wear compression sleeve as often as possible during the day. Wrist brace can help rest one of the motions that causes pain. Consider injection, nitro patches if not improving - call me if you want to do either one of these. Follow up with me in 6 weeks.

## 2015-03-22 NOTE — Telephone Encounter (Signed)
Ok that's fine

## 2015-03-22 NOTE — Therapy (Signed)
[facility] [address] [place], [place], [address] Phone: [phone]   Fax:  [phone]  Physical Therapy Treatment  Patient Details  Name: [name] MRN: [id number] Date of Birth: 07/05/1970 Referring Provider: [place]  Encounter Date: 03/22/2015      PT End of Session - 03/22/15 0827    Visit Number 8   Date for PT Re-Evaluation 04/16/15   PT Start Time 0800   PT Stop Time 0847   PT Time Calculation (min) 47 min   Activity Tolerance Patient tolerated treatment well   Behavior During Therapy [facility] for tasks assessed/performed      Past Medical History  Diagnosis Date  . Bipolar disorder ([facility])   . Eczema   . Pes planus   . Achilles tendinitis   . Hypertension   . Migraine   . Achilles tendinitis   . Ganglion cyst 09/29/2009    left wrist (2 cyst)  . Arthritis   . Allergy   . Lipoma   . ADHD (attention deficit hyperactivity disorder)   . Hyperprolactinemia ([facility])   . Bipolar affective ([facility])   . Personality disorder     Past Surgical History  Procedure Laterality Date  . Tumor resection left thigh    . Ankle surgery  12/88    left   . Lipoma removal    . Ganglion cyst excision  2011  . Chest nodule  1990?    rt chest wall nodule removal  . Shoulder surgery  01/13/2011    right, partial tear  . Right bunioectomy      There were no vitals filed for this visit.  Visit Diagnosis:  Right shoulder pain  Stiffness of right shoulder joint  Right wrist pain      Subjective Assessment - 03/22/15 0800    Subjective Just not feeling good.  I am run down.   Currently in Pain? Yes   Pain Score 5    Pain Location Elbow   Pain Orientation Right;Lateral   Pain Descriptors / Indicators Aching   Pain Onset More than a month ago   Aggravating Factors  work, usew of the right arm   Pain Relieving Factors ice maybe, rest does help                         [facility] Adult PT  Treatment/Exercise - 03/22/15 0001    Neck Exercises: Machines for Strengthening   Other Machines for Strengthening velcro board for hand and arm strength   Shoulder Exercises: ROM/Strengthening   "W" Arms 15 reps   X to V Arms 15 reps   Other ROM/Strengthening Exercises hammer RD/UD, hammer pro/supination   Electrical Stimulation   Electrical Stimulation Location right elbow and right shoulder   Electrical Stimulation Action premod   Electrical Stimulation Parameters tolerance   Electrical Stimulation Goals Pain   Manual Therapy   Manual therapy comments PROM of the right arm and wrist all motions to end range stretch, PROM of the right shoulder   Soft tissue mobilization CFM to the right wrist extensors, use of ice probe, STM to the right rhomboids, upper trap and into the cervical area                  PT Short Term Goals - 03/05/15 0901    PT SHORT TERM GOAL #1   Title independent with initial HEP   Status Achieved             PT Long Term Goals - 03/18/15 1343    PT LONG TERM GOAL #1   Title decrease pain 50%   Status On-going   PT LONG TERM GOAL #2   Title report 50% less difficulty at work   Status On-going   PT LONG TERM GOAL #3   Title increase right shoulder AROM to 140 degrees flexion   Status Partially Met   PT LONG TERM GOAL #4   Title increase right shoulder AROM to 70 degrees ER   Status On-going               Plan - 03/22/15 0828    Clinical Impression Statement Patient reports continued elbow pain, the wrist and forearm ROM are WNL's.  She has some limitation in ROM and strength of the shoulder, and some difficulty with strength of the forearm and wrist.   PT Next Visit Plan I have asked her to make an appointment with her MD.   Consulted and Agree with Plan of Care Patient        Problem List Patient Active Problem List   Diagnosis Date Noted  . 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 Strain of left thumb 02/11/2015  .  Strain of right forearm 02/11/2015  . Right shoulder pain 12/31/2014  . Injury of right little finger 12/31/2014  . Episodic cluster headache, 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  . 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 Hypersomnia, recurrent 12/02/2014  . Migraine aura, persistent, intractable, with status migrainosus 12/02/2014  . Lower back injury 09/30/2014  . Bipolar I disorder, most recent episode depressed ([facility])   . MDD (major depressive disorder), recurrent, severe, with psychosis ([facility]) 09/23/2014  . Injury of left index finger 08/20/2014  . Right ankle sprain 06/01/2014  . Contusion, multiple sites 06/01/2014  . Strain of right gastrocnemius muscle 06/01/2014  . Phonophobia 05/04/2014  . Photophobia of both eyes 05/04/2014  . Emotionally unstable borderline personality disorder 05/04/2014  . Nausea with vomiting 05/04/2014  . Mixed bipolar I disorder ([facility])   . Bipolar I disorder, most recent episode mixed ([facility]) 04/18/2014  . Bipolar affective disorder, depressed, severe ([facility]) 04/12/2014  . Suicidal ideation 04/12/2014  . Injury of right shoulder and upper arm 02/17/2014  . Left leg pain 06/03/2013  . Right hip pain 06/03/2013  . Migraine with status migrainosus 01/08/2013  . Personality disorder   . Chronic migraine 05/08/2012  . Contact dermatitis 11/27/2011  . Major depressive disorder, recurrent episode ([facility]) 10/27/2011  . Generalized anxiety disorder 10/27/2011  . 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 Borderline personality disorder 10/27/2011  . Right foot pain 09/28/2011  . 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  . Ganglion cyst 09/29/2009  . PES PLANUS 07/01/2008  . BIPOLAR DISORDER UNSPECIFIED 06/09/2008    ALBRIGHT,MICHAEL W., PT 03/22/2015, 8:37 AM  [facility] [address] [place], [place], [address] Phone: [phone]   Fax:  [phone]  Name: Gilberto M Quast MRN: [id number] Date of Birth: 03/26/1970     

## 2015-03-23 NOTE — Assessment & Plan Note (Signed)
Right forearm strain, lateral epicondylitis - Continue with physical therapy, home exercises.  Compression sleeve or counterforce brace.  We discussed positives and negatives of nitro patches and injection - she would like to wait on these for now.  F/u in 6 weeks.

## 2015-03-23 NOTE — Progress Notes (Signed)
PCP: Aretta Nip, MD  Subjective:   HPI: Patient is a 45 y.o. female here for right arm pain.  8/30: Patient reports on 8/26 she fell in the bathroom and scraped posterior low back against front of the toilet. + bruising and localized swelling. Pain level 6/10. Pain worse with twisting, bending forward. No history of osteoporosis. Taking tylenol, using ice pack initially - now taking robaxin and ibuprofen. No radiation into extremities. No numbness/tingling. No bowel/bladder dysfunction.  9/30: Patient reports she is not improving. Has had good and bad days. Pain level 6/10. Felt more on the left side of lumbar spine. Described as a soreness worse with bending, twisting. No swelling.  Bruising has improved. No radiation. No numbness/tingling. No bowel/bladder dysfunction. Taking robaxin and ibuprofen only as needed. Has been using heat.  11/30: Patient reports her back is 85% improved. Main problem is right shoulder pain without a new injury. Worse with overhead motions, reaching. 2/10 level of pain at rest, sharp with overhead motions. She also reports having struck ulnar aspect of right hand on something about a week ago. + swelling and bruising Has improved since then but still feels stiff. No skin changes, fever, other complaints.  02/10/15: Patient reports she has improved - pain at most 3/10. Still can be sharp with overhead motions lateral shoulder. Doing home exercises. Has not done physical therapy because of the holidays and work schedule. No skin changes, fever other complaints. Soreness left thumb and right forearm as well after shoveling ice.  2/20: Patient reports she's having 5/10 level pain lateral right forearm and elbow. Doing physical therapy, home exercises and still struggling. Pain can be up to 8/10 and sharp. Worse picking up items, rotating. Skin irritation with patches used in PT. No other skin changes, fever, other  complaints.  Past Medical History  Diagnosis Date  . Bipolar disorder ([place])   . Eczema   . Pes planus   . Achilles tendinitis   . Hypertension   . Migraine   . Achilles tendinitis   . Ganglion cyst 09/29/2009    left wrist (2 cyst)  . Arthritis   . Allergy   . Lipoma   . ADHD (attention deficit hyperactivity disorder)   . Hyperprolactinemia ([place])   . Bipolar affective ([place])   . Personality disorder     Current Outpatient Prescriptions on File Prior to Visit  Medication Sig Dispense Refill  . azelastine (ASTELIN) 0.1 % nasal spray instill 1 to 2 sprays into each nostril twice a day  0  . divalproex (DEPAKOTE ER) 500 MG 24 hr tablet take 3 tablets by mouth once daily at bedtime 90 tablet 1  . hydrochlorothiazide (HYDRODIURIL) 25 MG tablet Take 1 tablet (25 mg total) by mouth daily. For blood pressure control. 30 tablet 0  . ibuprofen (ADVIL,MOTRIN) 800 MG tablet Take 1 tablet (800 mg total) by mouth every 8 (eight) hours as needed. 90 tablet 0  . lamoTRIgine (LAMICTAL) 200 MG tablet Take 1 tablet (200 mg total) by mouth at bedtime. For mood stabilization 30 tablet 0  . lurasidone (LATUDA) 80 MG TABS tablet Take 1 tablet (80 mg total) by mouth daily at 6 PM. For mood control 30 tablet 0  . methocarbamol (ROBAXIN) 500 MG tablet Take 1 tablet (500 mg total) by mouth every 6 (six) hours as needed for muscle spasms. (Patient not taking: Reported on 02/17/2015) 12 tablet 1  . metoprolol succinate (TOPROL-XL) 100 MG 24 hr tablet Take 1 tablet (100  mg total) by mouth daily. Take with or immediately following a meal for blood pressure control. 30 tablet 0  . Multiple Vitamin (MULTIVITAMIN WITH MINERALS) TABS tablet Take 1 tablet by mouth daily. For nutritional supplementation. 30 tablet 0  . promethazine (PHENERGAN) 25 MG tablet Take 1 tablet (25 mg total) by mouth every 6 (six) hours as needed for nausea or vomiting. 30 tablet 1  . sodium chloride 0.9 % SOLN 100 mL with valproate 500 MG/5ML  SOLN 500 mg Inject 500 mg into the vein once as needed. To be infused over  a 15 minutes period, repeat once if partial relief.    . SUMAtriptan (IMITREX) 6 MG/0.5ML SOLN injection Inject 0.5 mLs (6 mg total) into the skin every 2 (two) hours as needed for migraine or headache. May repeat in 2 hours if headache persists or recurs.  0  . zolpidem (AMBIEN) 10 MG tablet Take 10 mg by mouth at bedtime.     No current facility-administered medications on file prior to visit.    Past Surgical History  Procedure Laterality Date  . Tumor resection left thigh    . Ankle surgery  12/88    left   . Lipoma removal    . Ganglion cyst excision  2011  . Chest nodule  1990?    rt chest wall nodule removal  . Shoulder surgery  01/13/2011    right, partial tear  . Right bunioectomy      Allergies  Allergen Reactions  . Adhesive [Tape] Itching and Rash    Also reacted to Steri Strips and Band-Aids.  . Dilaudid [Hydromorphone Hcl] Itching  . Morphine Nausea And Vomiting  . Penicillins Hives  . Percocet [Oxycodone-Acetaminophen] Itching  . Prednisone Hives  . Provera [Medroxyprogesterone Acetate] Other (See Comments)    Causes manic episodes  . Ultram [Tramadol Hcl] Itching    Social History   Social History  . Marital Status: Single    Spouse Name: N/A  . Number of Children: 0  . Years of Education: N/A   Occupational History  .  [employer]   Social History Main Topics  . Smoking status: Never Smoker   . Smokeless tobacco: Never Used  . Alcohol Use: No  . Drug Use: No  . Sexual Activity: No   Other Topics Concern  . Not on file   Social History Narrative   Caffeine  2 sodas daily, 1 cup coffee daily.    Family History  Problem Relation Age of Onset  . Hypertension Mother   . Hyperlipidemia Mother   . Heart attack Father   . Heart disease Father   . Hypertension Father   . Bipolar disorder Father   . Diabetes Paternal Grandfather   . Heart disease Maternal Aunt   . Breast  cancer Maternal Aunt   . Heart disease Maternal Grandmother   . Cancer Maternal Grandmother     colon    BP 123/85 mmHg  Pulse 64  Ht 5\' 10"  (1.778 m)  Wt 251 lb (113.853 kg)  BMI 36.01 kg/m2  Review of Systems: See HPI above.    Objective:  Physical Exam:  Gen: NAD, comfortable in exam room.  Right forearm/elbow: No gross deformity, swelling, bruising. TTP lateral epicondyle, extensors of forearm.   Collateral ligaments intact. FROM. Pain with resisted wrist and 3rd digit extension. NVI distally.  Left elbow: FROM without pain.  Assessment & Plan:  1. Right forearm strain, lateral epicondylitis - Continue with physical therapy, home  exercises.  Compression sleeve or counterforce brace.  We discussed positives and negatives of nitro patches and injection - she would like to wait on these for now.  F/u in 6 weeks.

## 2015-03-24 ENCOUNTER — Ambulatory Visit: Payer: Self-pay | Admitting: Family Medicine

## 2015-03-25 ENCOUNTER — Ambulatory Visit: Payer: PPO | Admitting: Physical Therapy

## 2015-03-25 ENCOUNTER — Encounter: Payer: Self-pay | Admitting: Physical Therapy

## 2015-03-25 DIAGNOSIS — H43813 Vitreous degeneration, bilateral: Secondary | ICD-10-CM | POA: Diagnosis not present

## 2015-03-25 DIAGNOSIS — M25531 Pain in right wrist: Secondary | ICD-10-CM

## 2015-03-25 DIAGNOSIS — M25611 Stiffness of right shoulder, not elsewhere classified: Secondary | ICD-10-CM

## 2015-03-25 DIAGNOSIS — M25511 Pain in right shoulder: Secondary | ICD-10-CM

## 2015-03-25 NOTE — Therapy (Signed)
[address] [place], [place], [address] Phone: [phone]   Fax:  [phone]  Physical Therapy Treatment  Patient Details  Name: [name] MRN: [id number] Date of Birth: 1970/03/15 Referring Provider: Barbaraann Barthel  Encounter Date: 03/25/2015      PT End of Session - 03/25/15 0919    Visit Number 9   Date for PT Re-Evaluation 04/16/15   PT Start Time 7703   PT Stop Time 0934   PT Time Calculation (min) 47 min   Activity Tolerance Patient tolerated treatment well   Behavior During Therapy [facility] for tasks assessed/performed      Past Medical History  Diagnosis Date  . Bipolar disorder ([place])   . Eczema   . Pes planus   . Achilles tendinitis   . Hypertension   . Migraine   . Achilles tendinitis   . Ganglion cyst 09/29/2009    left wrist (2 cyst)  . Arthritis   . Allergy   . Lipoma   . ADHD (attention deficit hyperactivity disorder)   . Hyperprolactinemia ([place])   . Bipolar affective ([place])   . Personality disorder     Past Surgical History  Procedure Laterality Date  . Tumor resection left thigh    . Ankle surgery  12/88    left   . Lipoma removal    . Ganglion cyst excision  2011  . Chest nodule  1990?    rt chest wall nodule removal  . Shoulder surgery  01/13/2011    right, partial tear  . Right bunioectomy      There were no vitals filed for this visit.  Visit Diagnosis:  Right shoulder pain  Stiffness of right shoulder joint  Right wrist pain      Subjective Assessment - 03/25/15 0849    Subjective (p) Patient reports that she saw MD on 03/22/15.  She was given two types of braces to wear on the right forearm.  One compression sleeve and one to limit the wrist motions.   Currently in Pain? 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   Acupuncturist Location right elbow and right shoulder   Electrical Stimulation Action premod   Electrical Stimulation Parameters tolerance   Electrical Stimulation Goals Pain                  PT Short Term Goals - 03/05/15 0901    PT SHORT TERM GOAL #1   Title independent with initial HEP   Status Achieved           PT Long Term Goals - 03/18/15 1343    PT LONG TERM GOAL #1   Title decrease pain 50%   Status On-going   PT LONG TERM GOAL #2   Title report 50% less difficulty at work   Status On-going   PT  LONG TERM GOAL #3   Title increase right shoulder AROM to 140 degrees flexion   Status Partially Met   PT LONG TERM GOAL #4   Title increase right shoulder AROM to 70 degrees ER   Status On-going               Plan - 03/25/15 0919    Clinical Impression Statement Patient with an episode today with another patient, he was joking around and she told him to stop and leave her alone, she percieved that he was making fun of her which was not the case at all.  I spoke with her about this being a safe place and that no one is making fun of her.  The treatment was cut short due to this incident   PT Next Visit Plan work simulation   Consulted and Agree with Plan of Care Patient        Problem List Patient Active Problem List   Diagnosis Date Noted  . 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 Strain of left thumb 02/11/2015  . Strain of right forearm 02/11/2015  . Right shoulder pain 12/31/2014  . Injury of right little finger 12/31/2014  . Episodic cluster headache, 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  . Chronic  paroxysmal hemicrania, 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  . 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 Hypersomnia, recurrent 12/02/2014  . Migraine aura, persistent, intractable, with status migrainosus 12/02/2014  . Lower back injury 09/30/2014  . Bipolar I disorder, most recent episode depressed ([place])   . MDD (major depressive disorder), recurrent, severe, with psychosis ([place]) 09/23/2014  . Injury of left index finger 08/20/2014  . Right ankle sprain 06/01/2014  . Contusion, multiple sites 06/01/2014  . Strain of right gastrocnemius muscle 06/01/2014  . Phonophobia 05/04/2014  . Photophobia of both eyes 05/04/2014  . Emotionally unstable borderline personality disorder 05/04/2014  . Nausea with vomiting 05/04/2014  . Mixed bipolar I disorder ([place])   . Bipolar I disorder, most recent episode mixed ([place]) 04/18/2014  . Bipolar affective disorder, depressed, severe ([place]) 04/12/2014  . Suicidal ideation 04/12/2014  . Injury of right shoulder and upper arm 02/17/2014  . Left leg pain 06/03/2013  . Right hip pain 06/03/2013  . Migraine with status migrainosus 01/08/2013  . Personality disorder   . Chronic migraine 05/08/2012  . Contact dermatitis 11/27/2011  . Major depressive disorder, recurrent episode ([place]) 10/27/2011  . Generalized anxiety disorder 10/27/2011  . 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 Borderline personality disorder 10/27/2011  . Right foot pain 09/28/2011  . 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 Ganglion cyst 09/29/2009  . PES PLANUS 07/01/2008  . BIPOLAR DISORDER UNSPECIFIED 06/09/2008    Sumner Boast., PT 03/25/2015, 9:22 AM  [address] [place], [place], [address] Phone: [phone]   Fax:  [phone]  Name: FREDERIKA HUKILL MRN: [id number] Date of Birth: 16-Aug-1970

## 2015-03-30 ENCOUNTER — Encounter: Payer: Self-pay | Admitting: Physical Therapy

## 2015-03-30 ENCOUNTER — Ambulatory Visit: Payer: PPO | Admitting: Physical Therapy

## 2015-03-30 DIAGNOSIS — M25511 Pain in right shoulder: Secondary | ICD-10-CM | POA: Diagnosis not present

## 2015-03-30 DIAGNOSIS — M25531 Pain in right wrist: Secondary | ICD-10-CM

## 2015-03-30 DIAGNOSIS — M25611 Stiffness of right shoulder, not elsewhere classified: Secondary | ICD-10-CM

## 2015-03-30 NOTE — Therapy (Signed)
[place] [address] [place], [place], [address] Phone: [phone]   Fax:  [phone]  Physical Therapy Treatment  Patient Details  Name: [name] MRN: [id number] Date of Birth: 09/23/1970 Referring Provider: Barbaraann Barthel  Encounter Date: 03/30/2015      PT End of Session - 03/30/15 1523    Visit Number 10   Date for PT Re-Evaluation 04/16/15   PT Start Time 1440   PT Stop Time 1535   PT Time Calculation (min) 55 min   Activity Tolerance Patient limited by pain   Behavior During Therapy [facility] for tasks assessed/performed      Past Medical History  Diagnosis Date  . Bipolar disorder ([place])   . Eczema   . Pes planus   . Achilles tendinitis   . Hypertension   . Migraine   . Achilles tendinitis   . Ganglion cyst 09/29/2009    left wrist (2 cyst)  . Arthritis   . Allergy   . Lipoma   . ADHD (attention deficit hyperactivity disorder)   . Hyperprolactinemia ([place])   . Bipolar affective ([place])   . Personality disorder     Past Surgical History  Procedure Laterality Date  . Tumor resection left thigh    . Ankle surgery  12/88    left   . Lipoma removal    . Ganglion cyst excision  2011  . Chest nodule  1990?    rt chest wall nodule removal  . Shoulder surgery  01/13/2011    right, partial tear  . Right bunioectomy      There were no vitals filed for this visit.  Visit Diagnosis:  Right shoulder pain  Stiffness of right shoulder joint  Right wrist pain      Subjective Assessment - 03/30/15 1520    Subjective Pateint comes in today with a rash around the left forearm.  She had brought a new sleeve in that she was wearing at the last treatment and reports she noticed the rash on Sunday   Currently in Pain? 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, how to decrease stress on the arm/elbow with the brace and with different ways to do it.how to take brace off every hour for an hour to allow skin to clear, she will be seeing her dermatologist tomorrow   Acupuncturist Location right elbow and right shoulder   Electrical Stimulation Action premod   Electrical Stimulation Parameters tolerance   Electrical Stimulation Goals Pain   Manual Therapy   Manual therapy comments PROM of the right arm and wrist all motions to end range stretch, PROM of the right shoulder   Soft tissue mobilization CFM to the right wrist extensors, use of ice probe, STM to the right rhomboids, upper trap and into the cervical area                  PT Short Term Goals - 03/05/15 0901    PT SHORT TERM  GOAL #1   Title independent with initial HEP   Status Achieved           PT Long Term Goals - 2015-04-15 1525    PT LONG TERM GOAL #1   Title decrease pain 50%   Status Partially Met   PT LONG TERM GOAL #2   Title report 50% less difficulty at work   Status Partially Met               Plan - 04-15-2015 1523    Clinical Impression Statement Patient with a large rash where her neew sleeve has been, she will see the dermatologist tomorrow.  She reports that she will not be working again until Friday, I encouraged rest.   PT Next Visit Plan work simulation   Consulted and Agree with Plan of Care Patient          G-Codes - 04-15-15 1525    Functional Assessment Tool Used FOTO 48% limitation   Functional Limitation Other PT primary   Other PT Primary Current Status (C5852) At least 40 percent but less than 60 percent impaired, limited or restricted   Other PT Primary Goal Status (D7824) At least 20 percent but less than 40 percent impaired,  limited or restricted      Problem List Patient Active Problem List   Diagnosis Date Noted  . 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 Strain of left thumb 02/11/2015  . Strain of right forearm 02/11/2015  . Right shoulder pain 12/31/2014  . Injury of right little finger 12/31/2014  . Episodic cluster headache, 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  . 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 Hypersomnia, recurrent 12/02/2014  . Migraine aura, persistent, intractable, with status migrainosus 12/02/2014  . Lower back injury 09/30/2014  . Bipolar I disorder, most recent episode depressed ([place])   . MDD (major depressive disorder), recurrent, severe, with psychosis ([place]) 09/23/2014  . Injury of left index finger 08/20/2014  . Right ankle sprain 06/01/2014  . Contusion, multiple sites 06/01/2014  . Strain of right gastrocnemius muscle 06/01/2014  . Phonophobia 05/04/2014  . Photophobia of both eyes 05/04/2014  . Emotionally unstable borderline personality disorder 05/04/2014  . Nausea with vomiting 05/04/2014  . Mixed bipolar I disorder ([place])   . Bipolar I disorder, most recent episode mixed ([place]) 04/18/2014  . Bipolar affective disorder, depressed, severe ([place]) 04/12/2014  . Suicidal ideation 04/12/2014  . Injury of right shoulder and upper arm 02/17/2014  . Left leg pain 06/03/2013  . Right hip pain 06/03/2013  . Migraine with status migrainosus 01/08/2013  . Personality disorder   . Chronic migraine 05/08/2012  . Contact dermatitis 11/27/2011  . Major depressive disorder, recurrent episode ([place]) 10/27/2011  . Generalized anxiety disorder 10/27/2011  . 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 Borderline personality disorder 10/27/2011  . Right foot pain 09/28/2011  . 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 Ganglion cyst 09/29/2009   . PES PLANUS 07/01/2008  . BIPOLAR DISORDER UNSPECIFIED 06/09/2008    Sumner Boast., PT 04/15/15, 3:26 PM  [address] [place], [place], [address] Phone: [phone]   Fax:  [phone]  Name: CORTLYN CANNELL MRN: [id number] Date of Birth: 07-06-1970

## 2015-03-31 ENCOUNTER — Telehealth: Payer: Self-pay | Admitting: Family Medicine

## 2015-03-31 DIAGNOSIS — L259 Unspecified contact dermatitis, unspecified cause: Secondary | ICD-10-CM | POA: Diagnosis not present

## 2015-03-31 DIAGNOSIS — L820 Inflamed seborrheic keratosis: Secondary | ICD-10-CM | POA: Diagnosis not present

## 2015-03-31 NOTE — Telephone Encounter (Signed)
Spoke to patient and she is going to come by and pick up ACE wrap.

## 2015-03-31 NOTE — Telephone Encounter (Signed)
I don't think we have a different material other than a simple ACE wrap, do we?

## 2015-04-02 ENCOUNTER — Encounter: Payer: Self-pay | Admitting: Physical Therapy

## 2015-04-02 ENCOUNTER — Ambulatory Visit: Payer: PPO | Attending: Family Medicine | Admitting: Physical Therapy

## 2015-04-02 DIAGNOSIS — M25511 Pain in right shoulder: Secondary | ICD-10-CM | POA: Insufficient documentation

## 2015-04-02 DIAGNOSIS — M25531 Pain in right wrist: Secondary | ICD-10-CM | POA: Diagnosis not present

## 2015-04-02 DIAGNOSIS — M25611 Stiffness of right shoulder, not elsewhere classified: Secondary | ICD-10-CM | POA: Diagnosis not present

## 2015-04-02 NOTE — Therapy (Signed)
[place] [address] [place], [place], [address] Phone: [phone]   Fax:  [phone]  Physical Therapy Treatment  Patient Details  Name: [name] MRN: [id number] Date of Birth: 1970/05/23 Referring Provider: Barbaraann Barthel  Encounter Date: 04/02/2015      PT End of Session - 04/02/15 1051    Visit Number 11   Date for PT Re-Evaluation 04/16/15   PT Start Time 0858   PT Stop Time 0947   PT Time Calculation (min) 49 min   Activity Tolerance Patient limited by pain   Behavior During Therapy [facility] for tasks assessed/performed      Past Medical History  Diagnosis Date  . Bipolar disorder ([place])   . Eczema   . Pes planus   . Achilles tendinitis   . Hypertension   . Migraine   . Achilles tendinitis   . Ganglion cyst 09/29/2009    left wrist (2 cyst)  . Arthritis   . Allergy   . Lipoma   . ADHD (attention deficit hyperactivity disorder)   . Hyperprolactinemia ([place])   . Bipolar affective ([place])   . Personality disorder     Past Surgical History  Procedure Laterality Date  . Tumor resection left thigh    . Ankle surgery  12/88    left   . Lipoma removal    . Ganglion cyst excision  2011  . Chest nodule  1990?    rt chest wall nodule removal  . Shoulder surgery  01/13/2011    right, partial tear  . Right bunioectomy      There were no vitals filed for this visit.  Visit Diagnosis:  Right shoulder pain  Stiffness of right shoulder joint  Right wrist pain      Subjective Assessment - 04/02/15 1050    Subjective Patient was 14 minutes late.  She reports that the dermatologist sais she was probably allergic to the neoprene in the sleeve.   Currently in Pain? Yes   Pain Score 2    Pain Location Elbow   Pain Orientation Right            [facility] PT Assessment - 04/02/15 0001    AROM   Right Shoulder Flexion 130 Degrees   Right Shoulder ABduction 105 Degrees   Right Shoulder Internal  Rotation 50 Degrees   Right Shoulder External Rotation 65 Degrees                     [facility] Adult PT Treatment/Exercise - 04/02/15 0001    Electrical Stimulation   Electrical Stimulation Location right elbow and right shoulder   Electrical Stimulation Action premod   Electrical Stimulation Parameters tolerance   Electrical Stimulation Goals Pain   Manual Therapy   Manual therapy comments PROM of the right arm and wrist all motions to end range stretch, PROM of the right shoulder, taped the right elbow with K-tape to releive stress 2 strips   Soft tissue mobilization CFM to the right wrist extensors, use of ice probe, STM to the right rhomboids, upper trap and into the cervical area                  PT Short Term Goals - 03/05/15 0901    PT SHORT TERM GOAL #1   Title independent with initial HEP   Status Achieved           PT Long Term Goals - 03/30/15 1525  PT LONG TERM GOAL #1   Title decrease pain 50%   Status Partially Met   PT LONG TERM GOAL #2   Title report 50% less difficulty at work   Status Partially Met               Plan - 04/02/15 1052    Clinical Impression Statement Rash is from allergy to neoprene, tried tape today.  She is to have eye surgery in the near future so she will miss a week of work an [place] see if tape helped   Consulted and Agree with Plan of Care Patient        Problem List Patient Active Problem List   Diagnosis Date Noted  . 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 Strain of left thumb 02/11/2015  . Strain of right forearm 02/11/2015  . Right shoulder pain 12/31/2014  . Injury of right little finger 12/31/2014  . Episodic cluster headache, 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  . 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 Hypersomnia, recurrent 12/02/2014  . Migraine aura, persistent, intractable, with status migrainosus  12/02/2014  . Lower back injury 09/30/2014  . Bipolar I disorder, most recent episode depressed ([place])   . MDD (major depressive disorder), recurrent, severe, with psychosis ([place]) 09/23/2014  . Injury of left index finger 08/20/2014  . Right ankle sprain 06/01/2014  . Contusion, multiple sites 06/01/2014  . Strain of right gastrocnemius muscle 06/01/2014  . Phonophobia 05/04/2014  . Photophobia of both eyes 05/04/2014  . Emotionally unstable borderline personality disorder 05/04/2014  . Nausea with vomiting 05/04/2014  . Mixed bipolar I disorder ([place])   . Bipolar I disorder, most recent episode mixed ([place]) 04/18/2014  . Bipolar affective disorder, depressed, severe ([place]) 04/12/2014  . Suicidal ideation 04/12/2014  . Injury of right shoulder and upper arm 02/17/2014  . Left leg pain 06/03/2013  . Right hip pain 06/03/2013  . Migraine with status migrainosus 01/08/2013  . Personality disorder   . Chronic migraine 05/08/2012  . Contact dermatitis 11/27/2011  . Major depressive disorder, recurrent episode ([place]) 10/27/2011  . Generalized anxiety disorder 10/27/2011  . 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 Borderline personality disorder 10/27/2011  . Right foot pain 09/28/2011  . 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 Ganglion cyst 09/29/2009  . PES PLANUS 07/01/2008  . BIPOLAR DISORDER UNSPECIFIED 06/09/2008    Sumner Boast., PT 04/02/2015, 10:58 AM  [address] [place], [place], [address] Phone: [phone]   Fax:  [phone]  Name: [name] MRN: [id number] Date of Birth: 1970/09/20

## 2015-04-05 ENCOUNTER — Encounter: Payer: Self-pay | Admitting: Physical Therapy

## 2015-04-05 ENCOUNTER — Ambulatory Visit: Payer: PPO | Admitting: Physical Therapy

## 2015-04-05 DIAGNOSIS — M25531 Pain in right wrist: Secondary | ICD-10-CM

## 2015-04-05 DIAGNOSIS — M25511 Pain in right shoulder: Secondary | ICD-10-CM | POA: Diagnosis not present

## 2015-04-05 DIAGNOSIS — M25611 Stiffness of right shoulder, not elsewhere classified: Secondary | ICD-10-CM

## 2015-04-05 NOTE — Therapy (Signed)
[place] [address] [place], [place], [address] Phone: [phone]   Fax:  [phone]  Physical Therapy Treatment  Patient Details  Name: [name] MRN: [id number] Date of Birth: Jun 25, 1970 Referring Provider: Barbaraann Barthel  Encounter Date: 04/05/2015      PT End of Session - 04/05/15 1439    Visit Number 12   Date for PT Re-Evaluation 04/16/15   PT Start Time 4917   PT Stop Time 1446   PT Time Calculation (min) 49 min   Activity Tolerance Patient tolerated treatment well   Behavior During Therapy [facility] for tasks assessed/performed      Past Medical History  Diagnosis Date  . Bipolar disorder ([place])   . Eczema   . Pes planus   . Achilles tendinitis   . Hypertension   . Migraine   . Achilles tendinitis   . Ganglion cyst 09/29/2009    left wrist (2 cyst)  . Arthritis   . Allergy   . Lipoma   . ADHD (attention deficit hyperactivity disorder)   . Hyperprolactinemia ([place])   . Bipolar affective ([place])   . Personality disorder     Past Surgical History  Procedure Laterality Date  . Tumor resection left thigh    . Ankle surgery  12/88    left   . Lipoma removal    . Ganglion cyst excision  2011  . Chest nodule  1990?    rt chest wall nodule removal  . Shoulder surgery  01/13/2011    right, partial tear  . Right bunioectomy      There were no vitals filed for this visit.  Visit Diagnosis:  Right shoulder pain  Stiffness of right shoulder joint  Right wrist pain      Subjective Assessment - 04/05/15 1429    Subjective Patient will be having eye surgery tomorrow.  She reports that the tape seemed to help the arm pain   Currently in Pain? Yes   Pain Score 1    Pain Location Elbow                         [facility] Adult PT Treatment/Exercise - 04/05/15 0001    Electrical Stimulation   Electrical Stimulation Location right elbow and right shoulder   Electrical Stimulation  Action premod   Electrical Stimulation Parameters tolerance   Electrical Stimulation Goals Pain   Manual Therapy   Manual therapy comments PROM of the right arm and wrist all motions to end range stretch, PROM of the right shoulder, taped the right elbow with K-tape to releive stress 2 strips   Soft tissue mobilization CFM to the right wrist extensors, use of ice probe, STM to the right rhomboids, upper trap and into the cervical area                  PT Short Term Goals - 03/05/15 0901    PT SHORT TERM GOAL #1   Title independent with initial HEP   Status Achieved           PT Long Term Goals - 03/30/15 1525    PT LONG TERM GOAL #1   Title decrease pain 50%   Status Partially Met   PT LONG TERM GOAL #2   Title report 50% less difficulty at work   Status Partially Met               Plan -  04/05/15 1440    Clinical Impression Statement Patient reports good benefit from tape.  She will have eye surgery tomorrow, has some tightness in the right pectoral area.  She is tender here as well.   PT Next Visit Plan try tape again, may try pectoral stretches and STM   Consulted and Agree with Plan of Care Patient        Problem List Patient Active Problem List   Diagnosis Date Noted  . 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 Strain of left thumb 02/11/2015  . Strain of right forearm 02/11/2015  . Right shoulder pain 12/31/2014  . Injury of right little finger 12/31/2014  . Episodic cluster headache, 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  . 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 Hypersomnia, recurrent 12/02/2014  . Migraine aura, persistent, intractable, with status migrainosus 12/02/2014  . Lower back injury 09/30/2014  . Bipolar I disorder, most recent episode depressed ([place])   . MDD (major depressive disorder), recurrent, severe, with psychosis ([place]) 09/23/2014  . Injury of left index finger  08/20/2014  . Right ankle sprain 06/01/2014  . Contusion, multiple sites 06/01/2014  . Strain of right gastrocnemius muscle 06/01/2014  . Phonophobia 05/04/2014  . Photophobia of both eyes 05/04/2014  . Emotionally unstable borderline personality disorder 05/04/2014  . Nausea with vomiting 05/04/2014  . Mixed bipolar I disorder ([place])   . Bipolar I disorder, most recent episode mixed ([place]) 04/18/2014  . Bipolar affective disorder, depressed, severe ([place]) 04/12/2014  . Suicidal ideation 04/12/2014  . Injury of right shoulder and upper arm 02/17/2014  . Left leg pain 06/03/2013  . Right hip pain 06/03/2013  . Migraine with status migrainosus 01/08/2013  . Personality disorder   . Chronic migraine 05/08/2012  . Contact dermatitis 11/27/2011  . Major depressive disorder, recurrent episode ([place]) 10/27/2011  . Generalized anxiety disorder 10/27/2011  . 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 Borderline personality disorder 10/27/2011  . Right foot pain 09/28/2011  . 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 Ganglion cyst 09/29/2009  . PES PLANUS 07/01/2008  . BIPOLAR DISORDER UNSPECIFIED 06/09/2008    Sumner Boast., PT 04/05/2015, 3:13 PM  [address] [place], [place], [address] Phone: [phone]   Fax:  [phone]  Name: JOLYNN BAJOREK MRN: [id number] Date of Birth: 20-Mar-1970

## 2015-04-06 DIAGNOSIS — H25011 Cortical age-related cataract, right eye: Secondary | ICD-10-CM | POA: Diagnosis not present

## 2015-04-06 DIAGNOSIS — H52201 Unspecified astigmatism, right eye: Secondary | ICD-10-CM | POA: Diagnosis not present

## 2015-04-06 DIAGNOSIS — H2511 Age-related nuclear cataract, right eye: Secondary | ICD-10-CM | POA: Diagnosis not present

## 2015-04-06 DIAGNOSIS — H25041 Posterior subcapsular polar age-related cataract, right eye: Secondary | ICD-10-CM | POA: Diagnosis not present

## 2015-04-06 DIAGNOSIS — H25811 Combined forms of age-related cataract, right eye: Secondary | ICD-10-CM | POA: Diagnosis not present

## 2015-04-06 DIAGNOSIS — H4421 Degenerative myopia, right eye: Secondary | ICD-10-CM | POA: Diagnosis not present

## 2015-04-12 ENCOUNTER — Ambulatory Visit: Payer: Self-pay | Admitting: Family Medicine

## 2015-04-17 ENCOUNTER — Other Ambulatory Visit: Payer: Self-pay | Admitting: Neurology

## 2015-04-29 ENCOUNTER — Telehealth: Payer: Self-pay | Admitting: Neurology

## 2015-04-29 NOTE — Telephone Encounter (Signed)
I spoke to Dr. Brett Fairy. We do not have an infusion nurse today or tomorrow. Dr. Brett Fairy advised me to tell the pt to go to the ED if it gets too bad or take an extra depakote.  I called pt, no answer, left a message asking her to call me back.

## 2015-04-29 NOTE — Telephone Encounter (Signed)
Patient is calling to see if she can get an infusion today. She has had a migraine for 3 days. Please call and advise.

## 2015-04-29 NOTE — Telephone Encounter (Signed)
I spoke to pt and advised her that our infusion suite will not be staffed today or tomorrow. I advised pt to go to the ED if her migraine gets too bad or take one extra tablet of depakote, but do not exceed 4 tablets in one day. Pt verbalized understanding.

## 2015-05-03 ENCOUNTER — Other Ambulatory Visit: Payer: Self-pay

## 2015-05-03 NOTE — Patient Outreach (Signed)
[facility]) Care Management  05/03/2015  [name] 01-24-1971 [id number]   Screening   Referral Date:04/30/2015 Source:  HTA Tier4 List  Issue:  0 Admissions, 3 ER visits per data; No diagnosis listed. Please engage patient for [facility] Care Management services.   Outreach call #1 to patient for screening.   Patient not reached.   RN CM left HIPAA compliant voice mail message with name and call back #.  H/o Migraine HA, BH conditions.  H/o patient receiving services with [name] and [facility].    Plan:   RN CM scheduled for next contact call within one week.   Mariann Laster, RN, BSN, [facility], CCM  [employer] Management Coordinator [phone] Direct [phone] Cell [phone] Office [phone] Fax

## 2015-05-04 ENCOUNTER — Encounter: Payer: Self-pay | Admitting: Physical Therapy

## 2015-05-04 ENCOUNTER — Other Ambulatory Visit: Payer: Self-pay

## 2015-05-04 ENCOUNTER — Ambulatory Visit: Payer: PPO | Attending: Family Medicine | Admitting: Physical Therapy

## 2015-05-04 DIAGNOSIS — M25611 Stiffness of right shoulder, not elsewhere classified: Secondary | ICD-10-CM | POA: Insufficient documentation

## 2015-05-04 DIAGNOSIS — M25511 Pain in right shoulder: Secondary | ICD-10-CM | POA: Diagnosis not present

## 2015-05-04 DIAGNOSIS — M25531 Pain in right wrist: Secondary | ICD-10-CM | POA: Insufficient documentation

## 2015-05-04 NOTE — Therapy (Signed)
[place] [address] [place], [place], [address] Phone: [phone]   Fax:  [phone]  Physical Therapy Treatment  Patient Details  Name: [name] MRN: [id number] Date of Birth: 10-11-1970 Referring Provider: Barbaraann Barthel  Encounter Date: 05/04/2015      PT End of Session - 05/04/15 1053    Visit Number 13   Date for PT Re-Evaluation 06/03/15   PT Start Time 0928   PT Stop Time 1042   PT Time Calculation (min) 74 min   Activity Tolerance Patient tolerated treatment well   Behavior During Therapy [facility] for tasks assessed/performed      Past Medical History  Diagnosis Date  . Bipolar disorder ([place])   . Eczema   . Pes planus   . Achilles tendinitis   . Hypertension   . Migraine   . Achilles tendinitis   . Ganglion cyst 09/29/2009    left wrist (2 cyst)  . Arthritis   . Allergy   . Lipoma   . ADHD (attention deficit hyperactivity disorder)   . Hyperprolactinemia ([place])   . Bipolar affective ([place])   . Personality disorder     Past Surgical History  Procedure Laterality Date  . Tumor resection left thigh    . Ankle surgery  12/88    left   . Lipoma removal    . Ganglion cyst excision  2011  . Chest nodule  1990?    rt chest wall nodule removal  . Shoulder surgery  01/13/2011    right, partial tear  . Right bunioectomy      There were no vitals filed for this visit.  Visit Diagnosis:  Right shoulder pain - Plan: PT plan of care cert/re-cert  Stiffness of right shoulder joint - Plan: PT plan of care cert/re-cert  Right wrist pain - Plan: PT plan of care cert/re-cert      Subjective Assessment - 05/04/15 1037    Subjective Patient has been absent over the past month due to having an eye surgery.  She reports that she was doing pretty good until Sunday when she came home and her mom had fallen, she had to break in the house and then lift the mom.  She reports that her right arm is hurting  more now.   Currently in Pain? Yes   Pain Score 5    Pain Location Elbow   Pain Orientation Right;Medial   Aggravating Factors  lifting mom from floor   Pain Relieving Factors rest was helping            [facility] PT Assessment - 05/04/15 0001    AROM   Right Shoulder Flexion 20 Degrees   Right Shoulder ABduction 100 Degrees   Right Shoulder Internal Rotation 50 Degrees   Right Shoulder External Rotation 60 Degrees                     [facility] Adult PT Treatment/Exercise - 05/04/15 0001    Neck Exercises: Machines for Strengthening   UBE (Upper Arm Bike) 4 mins level 4   Cybex Row 20# 2x15   Cybex Chest Press 5# chest press with serratus push   Other Machines for Strengthening lat pulls 20# 3x10, straight arm lats 20#   Other Machines for Strengthening velcro board for hand and arm strength   Shoulder Exercises: ROM/Strengthening   "W" Arms 15 reps   X to V Arms 15 reps   Other ROM/Strengthening  Exercises hammer RD/UD, hammer pro/supination   Electrical Stimulation   Electrical Stimulation Location right elbow and right shoulder   Electrical Stimulation Action premod   Electrical Stimulation Parameters tolerance   Electrical Stimulation Goals Pain   Manual Therapy   Manual therapy comments PROM of the right arm and wrist all motions to end range stretch, PROM of the right shoulder, taped the right elbow with K-tape to releive stress 2 strips   Soft tissue mobilization CFM to the right wrist extensors, use of ice probe, STM to the right rhomboids, upper trap and into the cervical area                  PT Short Term Goals - 03/05/15 0901    PT SHORT TERM GOAL #1   Title independent with initial HEP   Status Achieved           PT Long Term Goals - 05/04/15 1055    PT LONG TERM GOAL #1   Title decrease pain 50%   Status On-going   PT LONG TERM GOAL #2   Title report 50% less difficulty at work   Status On-going   PT LONG TERM GOAL #3   Title  increase right shoulder AROM to 140 degrees flexion   Status On-going   PT LONG TERM GOAL #4   Title increase right shoulder AROM to 70 degrees ER   Status On-going               Plan - 05/04/15 1053    Clinical Impression Statement Patient with increased pain after lifting mom from the floor.  She remains tender to the right lateral elbow.     PT Next Visit Plan Will go back into treatment to try to calm the pain and increase her function   Consulted and Agree with Plan of Care Patient        Problem List Patient Active Problem List   Diagnosis Date Noted  . 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 Strain of left thumb 02/11/2015  . Strain of right forearm 02/11/2015  . Right shoulder pain 12/31/2014  . Injury of right little finger 12/31/2014  . Episodic cluster headache, 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  . 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 Hypersomnia, recurrent 12/02/2014  . Migraine aura, persistent, intractable, with status migrainosus 12/02/2014  . Lower back injury 09/30/2014  . Bipolar I disorder, most recent episode depressed ([place])   . MDD (major depressive disorder), recurrent, severe, with psychosis ([place]) 09/23/2014  . Injury of left index finger 08/20/2014  . Right ankle sprain 06/01/2014  . Contusion, multiple sites 06/01/2014  . Strain of right gastrocnemius muscle 06/01/2014  . Phonophobia 05/04/2014  . Photophobia of both eyes 05/04/2014  . Emotionally unstable borderline personality disorder 05/04/2014  . Nausea with vomiting 05/04/2014  . Mixed bipolar I disorder ([place])   . Bipolar I disorder, most recent episode mixed ([place]) 04/18/2014  . Bipolar affective disorder, depressed, severe ([place]) 04/12/2014  . Suicidal ideation 04/12/2014  . Injury of right shoulder and upper arm 02/17/2014  . Left leg pain 06/03/2013  . Right hip pain 06/03/2013  . Migraine with status migrainosus  01/08/2013  . Personality disorder   . Chronic migraine 05/08/2012  . Contact dermatitis 11/27/2011  . Major depressive disorder, recurrent episode ([place]) 10/27/2011  . Generalized anxiety disorder 10/27/2011  . 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 Borderline  personality disorder 10/27/2011  . Right foot pain 09/28/2011  . 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 Ganglion cyst 09/29/2009  . PES PLANUS 07/01/2008  . BIPOLAR DISORDER UNSPECIFIED 06/09/2008    Sumner Boast., PT 05/04/2015, 11:00 AM  [address] [place], [place], [address] Phone: [phone]   Fax:  [phone]  Name: [name] MRN: [id number] Date of Birth: 1970-03-30

## 2015-05-04 NOTE — Patient Outreach (Signed)
[facility]) Care Management  05/04/2015  [name] 12-13-1970 [id number]  Screening   Referral Date: 04/30/2015 Source: HTA Tier4 List  Issue: 0 Admissions, 3 ER visits per data; No diagnosis listed. Please engage patient for [facility] Care Management services.   Outreach call #1 to patient for screening.  Patient not reached [phone] (H) and other # [phone] invalid/disconnected.   RN CM left HIPAA compliant voice mail message on working # with name and call back #.  H/o Migraine HA, BH conditions. H/o patient receiving services with [name] and [facility].   Plan:  RN CM scheduled for next contact call within one week.   Mariann Laster, RN, BSN, [facility], CCM  [employer] Management Coordinator [phone] Direct [phone] Cell [phone] Office [phone] Fax

## 2015-05-05 ENCOUNTER — Other Ambulatory Visit: Payer: Self-pay

## 2015-05-05 NOTE — Patient Outreach (Signed)
[facility]) Care Management  05/05/2015  Almedia Uplinger Auxier 10-24-1970 [id number]   Screening   Referral Date: 04/30/2015 Source: HTA Tier4 List  Issue: 0 Admissions, 3 ER visits per data; No diagnosis listed. Please engage patient for [facility] Care Management services.   Outreach call #3 to patient for screening.  Patient not reached [phone] (H) and other # [phone] invalid/disconnected.  RN CM left HIPAA compliant voice mail message on working # with name and call back #.  H/o Migraine HA, BH conditions. H/o patient receiving services with [name] and [facility].   Plan:  RN CM mailed unsuccessful outreach letter and will review for response over the next 2 weeks.   Mariann Laster, RN, BSN, [facility], CCM  [employer] Management Coordinator [phone] Direct [phone] Cell [phone] Office [phone] Fax

## 2015-05-05 NOTE — Patient Outreach (Signed)
[facility]) Care Management  05/05/2015  [name] Jun 16, 1970 [id number]   Correction:  Phone Attempt #2.    Mariann Laster, RN, BSN, [facility], CCM  [employer] Management Coordinator [phone] Direct [phone] Cell [phone] Office [phone] Fax

## 2015-05-12 NOTE — Addendum Note (Signed)
Addended by: Sumner Boast on: 05/12/2015 11:07 AM   Modules accepted: Orders

## 2015-05-18 DIAGNOSIS — H25042 Posterior subcapsular polar age-related cataract, left eye: Secondary | ICD-10-CM | POA: Diagnosis not present

## 2015-05-18 DIAGNOSIS — H4422 Degenerative myopia, left eye: Secondary | ICD-10-CM | POA: Diagnosis not present

## 2015-05-18 DIAGNOSIS — H2512 Age-related nuclear cataract, left eye: Secondary | ICD-10-CM | POA: Diagnosis not present

## 2015-05-18 DIAGNOSIS — H25812 Combined forms of age-related cataract, left eye: Secondary | ICD-10-CM | POA: Diagnosis not present

## 2015-05-18 DIAGNOSIS — H25012 Cortical age-related cataract, left eye: Secondary | ICD-10-CM | POA: Diagnosis not present

## 2015-05-18 DIAGNOSIS — H52202 Unspecified astigmatism, left eye: Secondary | ICD-10-CM | POA: Diagnosis not present

## 2015-05-19 ENCOUNTER — Other Ambulatory Visit: Payer: Self-pay

## 2015-05-19 NOTE — Patient Outreach (Addendum)
[facility]) Care Management  05/19/2015  [name] September 10, 1970 [id number]  Case Closure  Case closure due to unable to reach patient following several phone attempts and unsuccessful outreach letter.    RN CM sent MD case closure notification letter.  RN CM notified [place] Management Assistant of case closure.    Mariann Laster, RN, BSN, [facility], CCM  [employer] Management Coordinator [phone] Direct [phone] Cell [phone] Office [phone] Fax

## 2015-05-31 ENCOUNTER — Encounter: Payer: Self-pay | Admitting: Neurology

## 2015-05-31 ENCOUNTER — Ambulatory Visit (INDEPENDENT_AMBULATORY_CARE_PROVIDER_SITE_OTHER): Payer: PPO | Admitting: Neurology

## 2015-05-31 ENCOUNTER — Other Ambulatory Visit: Payer: Self-pay | Admitting: Neurology

## 2015-05-31 VITALS — BP 122/88 | HR 88 | Resp 20 | Ht 70.0 in | Wt 251.0 lb

## 2015-05-31 DIAGNOSIS — G4451 Hemicrania continua: Secondary | ICD-10-CM

## 2015-05-31 DIAGNOSIS — G43011 Migraine without aura, intractable, with status migrainosus: Secondary | ICD-10-CM | POA: Diagnosis not present

## 2015-05-31 MED ORDER — VALPROATE SODIUM 500 MG/5ML IV SOLN: 500.0000 mg | Freq: Once | INTRAVENOUS | Status: DC

## 2015-05-31 NOTE — Telephone Encounter (Signed)
In your note it says to start the pt on indomethacin, however, there is not an order placed. Can you place the order and I will make sure it goes to [facility].

## 2015-05-31 NOTE — Progress Notes (Signed)
[facility]   Provider:  Larey Seat, M D  Referring Provider: Aretta Nip, MD Primary Care Physician:  Aretta Nip, MD   HPI:  [name] is a 45 y.o. female , seen here as a referral  from Dr. Caprice Beaver for a sleep consultation.  [name] has been followed in this office for migraine headaches but is today referred for a sleep consultation. She endorsed the Epworth sleepiness score between 13 and 15 points during her last visit with a psychiatrist. She also reports more myoclonic appearing jerks, both lower extremities with jump at the same time when she is just ready to fall asleep and prolonged her sleep latency thereby.  Chief complaint according to patient : "I fall asleep all the time. ";  " I have more migraines- they split right in the middle , affect my left face ".   Sleep habits are as follows: The patient reports that she does not have any screen light or lights penetrating her bedroom which is described as core, quiet and dark. She sleeps alone in the bedroom. She usually goes to bed around 11:30 and 12 PM, usually it will take her about 30 minutes to fall asleep. She has noticed that both legs seem to gels and get jumpy by the time she feels ready to enter sleep. She finds herself them again unable to fall asleep. These may not to be restless legs ;she does not describe creepy crawly sensations, rather myoclonic jerks. She has fallen out of bed several times sometimes in relation to visit nightmarish dreams being chased being under threat fearing for her life. These visit dreams are very detailed areas she has installed bed rails to prevent falling. She does usually not sleep walk. Mother has not witnessed any sleep talking either, but yelling . She lives in the same home as her mother, the bedrooms are across the hall from each other. Her mother has sometimes woken her when she seemed to have a nightmare and may have yelled or acted out.  Nightmares seem to occur within about 2 hours of sleep initiation. Usually only once per night. They don't occur every night either but perhaps 4 or 5 times a month. She states that she scrambles to switch the light on and that that gives her certainty that her dream is not reality. She has placed a flashlight on her nightstand for that reason. She has woken up finding that the nightstand had been moved or  she finds herself on the floor. If she has no appointments to keep in the morning she rises by about 9 AM, she relies on an alarm and she often hits the snooze button repeatedly. Most of the time she is neither refreshed nor restored in the morning. Some morning she will wake up with a dull headache throbbing, this may lift as the day goes on. Other nights she will wake up from a sharp stabbing headache that has a attack character. These cluster headaches usually occur more towards the morning hours but do not occur usually was in the first hour of sleep. She treats these with Imitrex. She will try to sleep these headaches off.  Social history: The patient will have a caffeine intake of about 2 sodas a day plus iced teas, rarely coffee. She will drink tea also for lunch or dinner. Non smoker, non alcohol drinker. Single , graduate of a local college.   Hypersomnia with extreme fatigue and depression. She is finally  gainfully employed.   Mother has insomnia, has trouble falling asleep and staying asleep. Father sleep habits are not known. Brother seems not to snore or have apnea.  Split study with capnography and expanded EEG montage.            I will also order an IV treatment for the patient's migrainous headache that she presented with today.            Depakote 500 mg IV with 25 mg of Medrol. If partial relief from headaches, this IV can be repeated once.     02-17-15,  [name] is here today for her revisit after sleep study. Her PSG revealed only mild obstructive sleep apnea the overall AHI  was 8.3 exacerbated during REM sleep during REM sleep her AHI was 21.8. She had about 23% of the night spent in rem sleep which is highly unusual for patient on multiple psychotropic medications and antidepressants. She has reported nightmares and vivid dreams and there was some sleep related talking and even at one time yelling. I did not find a sleep correlation to her morning headaches and episodic nocturnal cluster headaches. The high degree of sleepiness and fatigue remains unexplained. I would offer her to treat this mild degree of apnea given her comorbidities. The patient slept rather well after 1:30 AM but still had 3 brief periods of wakefulness. Again the use of CPAP would be optional in her case but my concern is that it would make her insomnia worse. She slept all night supine.   Interval history from 05/31/2015. [name] underwent a second cataract surgery. Her eyes are doing well, there were no complications no infections etc. She has developed a left temporal headache and is here today to see if she can get some Depakote infusion. I will also write for Indocin given that she has hours of throbbing left-sided headaches associated with some nausea.    Review of Systems: Out of a complete 14 system review, the patient complains of only the following symptoms, and all other reviewed systems are negative.  How likely are you to doze in the following situations: 0 = not likely, 1 = slight chance, 2 = moderate chance, 3 = high chance  Sitting and Reading? 1 Watching Television? 1 Sitting inactive in a public place (theater or meeting)? 1 Lying down in the afternoon when circumstances permit? 2 Sitting and talking to someone? 1 Sitting quietly after lunch without alcohol? 1 In a car, while stopped for a few minutes in traffic?1 As a passenger in a car for an hour without a break? 2  Total = 10    Epworth score  10 ,  Fatigue severity score 53  , depression score PHQ9    Social  History   Social History  . Marital Status: Single    Spouse Name: N/A  . Number of Children: 0  . Years of Education: N/A   Occupational History  .  [employer]   Social History Main Topics  . Smoking status: Never Smoker   . Smokeless tobacco: Never Used  . Alcohol Use: No  . Drug Use: No  . Sexual Activity: No   Other Topics Concern  . Not on file   Social History Narrative   Caffeine  2 sodas daily, 1 cup coffee daily.    Family History  Problem Relation Age of Onset  . Hypertension Mother   . Hyperlipidemia Mother   . Heart attack Father   . Heart disease Father   .  Hypertension Father   . Bipolar disorder Father   . Diabetes Paternal Grandfather   . Heart disease Maternal Aunt   . Breast cancer Maternal Aunt   . Heart disease Maternal Grandmother   . Cancer Maternal Grandmother     colon    Past Medical History  Diagnosis Date  . Bipolar disorder ([place])   . Eczema   . Pes planus   . Achilles tendinitis   . Hypertension   . Migraine   . Achilles tendinitis   . Ganglion cyst 09/29/2009    left wrist (2 cyst)  . Arthritis   . Allergy   . Lipoma   . ADHD (attention deficit hyperactivity disorder)   . Hyperprolactinemia ([place])   . Bipolar affective ([place])   . Personality disorder     Past Surgical History  Procedure Laterality Date  . Tumor resection left thigh    . Ankle surgery  12/88    left   . Lipoma removal    . Ganglion cyst excision  2011  . Chest nodule  1990?    rt chest wall nodule removal  . Shoulder surgery  01/13/2011    right, partial tear  . Right bunioectomy      Current Outpatient Prescriptions  Medication Sig Dispense Refill  . azelastine (ASTELIN) 0.1 % nasal spray instill 1 to 2 sprays into each nostril twice a day  0  . divalproex (DEPAKOTE ER) 500 MG 24 hr tablet take 3 tablets by mouth at bedtime 90 tablet 1  . hydrochlorothiazide (HYDRODIURIL) 25 MG tablet Take 1 tablet (25 mg total) by mouth daily. For blood pressure  control. 30 tablet 0  . hydrOXYzine (VISTARIL) 50 MG capsule take 1 capsule by mouth twice a day and 1 capsule at bedtime  0  . ibuprofen (ADVIL,MOTRIN) 800 MG tablet Take 1 tablet (800 mg total) by mouth every 8 (eight) hours as needed. 90 tablet 0  . lamoTRIgine (LAMICTAL) 200 MG tablet Take 1 tablet (200 mg total) by mouth at bedtime. For mood stabilization 30 tablet 0  . lurasidone (LATUDA) 80 MG TABS tablet Take 1 tablet (80 mg total) by mouth daily at 6 PM. For mood control 30 tablet 0  . methocarbamol (ROBAXIN) 500 MG tablet Take 1 tablet (500 mg total) by mouth every 6 (six) hours as needed for muscle spasms. 12 tablet 1  . metoprolol succinate (TOPROL-XL) 100 MG 24 hr tablet Take 1 tablet (100 mg total) by mouth daily. Take with or immediately following a meal for blood pressure control. 30 tablet 0  . Multiple Vitamin (MULTIVITAMIN WITH MINERALS) TABS tablet Take 1 tablet by mouth daily. For nutritional supplementation. 30 tablet 0  . prednisoLONE acetate (PRED FORTE) 1 % ophthalmic suspension instill 1 drop into left eye (OPERATIVE EYE) 4 TIMES A DAY STARTING AFTER SURGERY  0  . PROLENSA 0.07 % SOLN instill 1 drop ONCE DAILY, BEGINNING 1 DAY BEFORE SURGERY  0  . promethazine (PHENERGAN) 25 MG tablet Take 1 tablet (25 mg total) by mouth every 6 (six) hours as needed for nausea or vomiting. 30 tablet 1  . sodium chloride 0.9 % SOLN 100 mL with valproate 500 MG/5ML SOLN 500 mg Inject 500 mg into the vein once as needed. To be infused over  a 15 minutes period, repeat once if partial relief.    . SUMAtriptan (IMITREX) 6 MG/0.5ML SOLN injection Inject 0.5 mLs (6 mg total) into the skin every 2 (two) hours as needed for  migraine or headache. May repeat in 2 hours if headache persists or recurs.  0  . VIGAMOX 0.5 % ophthalmic solution instill 1 drop into left eye four times a day STARTING 2 DAYS PRI...  (REFER TO PRESCRIPTION NOTES).  0  . zolpidem (AMBIEN) 10 MG tablet Take 10 mg by mouth at  bedtime.     No current facility-administered medications for this visit.    Allergies as of 05/31/2015 - Review Complete 05/31/2015  Allergen Reaction Noted  . Adhesive [tape] Itching and Rash 09/12/2010  . Dilaudid [hydromorphone hcl] Itching 04/18/2011  . Morphine Nausea And Vomiting 04/06/2009  . Penicillins Hives 04/06/2009  . Percocet [oxycodone-acetaminophen] Itching 04/18/2011  . Prednisone Hives 10/15/2009  . Provera [medroxyprogesterone acetate] Other (See Comments) 08/08/2010  . Ultram [tramadol hcl] Itching 04/18/2011    Vitals: BP 122/88 mmHg  Pulse 88  Resp 20  Ht 5\' 10"  (1.778 m)  Wt 251 lb (113.853 kg)  BMI 36.01 kg/m2 Last Weight:  Wt Readings from Last 1 Encounters:  05/31/15 251 lb (113.853 kg)   [id number] mass index is 36.01 kg/(m^2).     Last Height:   Ht Readings from Last 1 Encounters:  05/31/15 5\' 10"  (1.778 m)    Physical exam:  General: The patient is awake, alert and appears not in acute distress. The patient is well groomed. Head: Normocephalic, atraumatic. Neck is supple. Mallampati 2  neck circumference: 15. Nasal airflow unrestricted , TMJ is not  evident . Retrognathia is not seen, rather prognathia .  Cardiovascular:  Regular rate and rhythm , without  murmurs or carotid bruit, and without distended neck veins. Respiratory: Lungs are clear to auscultation. Skin:  Without evidence of edema, or rash Trunk: BMI is 36 Neurologic exam : The patient is awake and alert, oriented to place and time.   Attention span & concentration ability appears normal.  Speech is fluent,  without dysarthria, dysphonia or aphasia.  Mood and affect are appropriate. She is not depressed, rather aloof.   Cranial nerves: Pupils are equal and briskly reactive to light. Funduscopic exam ; beginning Cataracts. Diplopia.  Extraocular movements  in vertical and horizontal planes intact and without nystagmus. Visual fields by finger perimetry are intact. Hearing to  finger rub intact.   Facial sensation intact to fine touch.  Facial motor strength is symmetric and tongue and uvula move midline. Shoulder shrug was symmetrical.   Motor exam:  Normal tone, muscle bulk and symmetric strength in all extremities. Sensory:  Fine touch, pinprick and vibration were tested in all extremities.  Proprioception tested in the upper extremities was normal. Coordination: Rapid alternating movements in the fingers/hands was normal.  Finger-to-nose maneuver normal without evidence of ataxia, dysmetria or tremor. Gait and station: Patient walks without assistive device and is able unassisted to climb up to the exam table. Strength within normal limits.  Stance is stable and normal based . Toe and heel stand were tested , and normal. Tandem gait is unfragmented. Turns with  3 Steps. Romberg testing is negative. Deep tendon reflexes: in the  upper and lower extremities are symmetric and intact. Babinski maneuver response is downgoing.   The patient was advised of the nature of the diagnosed sleep disorder , the treatment options and risks for general a health and wellness arising from not treating the condition.  I spent more than 25 minutes of face to face time with the patient.  Greater than 50% of time was spent in counseling and coordination of  care. We have discussed the diagnosis and differential and I answered the patient's questions.     Assessment:  After physical and neurologic examination, review of laboratory studies,  Personal review of imaging studies, reports of other /same imaging studies,  Results of EEG and  neurophysiology testing and pre-existing psychiatric records as far as provided in visit., my assessment is - mild OSA, no hypoxemia and Insomnia.   1) Mixed episode bipolar disease type 2 . In August she had some medication adjustment but she has stayed on that to water which seems to stabilize her. The dreaming and insomnia can well be related to her  bipolar disorder.   2) the patient describes Cluster headaches, headaches that wake her out of sleep with a stabbing sharp character as if penetrating the eye or temple. Cluster headaches occur mostly in slow-wave sleep. She's     not sure about the time of the night when these occur but it may be in the later hours of the night. She did not have these during her PSG.   Patient sometimes wakes up with a headache of a different quality a more dull and throbbing headache.  There may be some impact from the caffeine intake especially of late in the afternoon. We will discussed sleep hygiene in detail including reducing fluid intake after 6 PM reducing caffeine intake afternoon, and seeking exposure to bright daylight in the morning for at least half an hour to stimulate wakefulness.  A routine exercise program would be beneficial but it could just contain a walking schedule. She has joined the [employer].  We agreed after 20 minute discussion not to treat this mild OSA with CPAP, as she has no hypoxemia of significance.   3) since having her second cataract removed the patient developed left temple headaches. These are not the head splitting cluster intense headaches, and she has  significant nausea with it. She describes a pressure quality, these headaches can last several hours. 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 She will get a depakote infusion.  Stay away from caffeine!!  Asencion Partridge Joyce Yiu MD  05/31/2015   CC: Aretta Nip, [address] [place], [place] [id number]

## 2015-05-31 NOTE — Telephone Encounter (Signed)
Pt called in to let Dr. Brett Fairy know the new medication that was prescribed could be sent to [facility] off of [address]. They told her it would be paid for. Pt did not remember the name of the medication though. Please call [phone]

## 2015-06-01 LAB — COMPREHENSIVE METABOLIC PANEL
ALT: 13 IU/L (ref 0–32)
AST: 19 IU/L (ref 0–40)
Albumin/Globulin Ratio: 2.2 (ref 1.2–2.2)
Albumin: 3.9 g/dL (ref 3.5–5.5)
Alkaline Phosphatase: 66 IU/L (ref 39–117)
BUN/Creatinine Ratio: 7 — ABNORMAL LOW (ref 9–23)
BUN: 7 mg/dL (ref 6–24)
Bilirubin Total: 0.4 mg/dL (ref 0.0–1.2)
CO2: 25 mmol/L (ref 18–29)
Calcium: 9.5 mg/dL (ref 8.7–10.2)
Chloride: 98 mmol/L (ref 96–106)
Creatinine, Ser: 0.96 mg/dL (ref 0.57–1.00)
GFR calc Af Amer: 83 mL/min/{1.73_m2} (ref >59)
GFR calc non Af Amer: 72 mL/min/{1.73_m2} (ref >59)
Globulin, Total: 1.8 g/dL (ref 1.5–4.5)
Glucose: 99 mg/dL (ref 65–99)
Potassium: 4.7 mmol/L (ref 3.5–5.2)
Sodium: 142 mmol/L (ref 134–144)
Total Protein: 5.7 g/dL — ABNORMAL LOW (ref 6.0–8.5)

## 2015-06-03 ENCOUNTER — Telehealth: Payer: Self-pay

## 2015-06-03 MED ORDER — INDOMETHACIN 25 MG PO CAPS
25.0000 mg | ORAL_CAPSULE | Freq: Two times a day (BID) | ORAL | Status: DC
Start: 2015-06-03 — End: 2015-06-03

## 2015-06-03 NOTE — Telephone Encounter (Signed)
Pt call in and wanted to know status about medication indomethacin requesting a call back [phone]

## 2015-06-03 NOTE — Telephone Encounter (Signed)
Received a verbal order from Dr. Brett Fairy to start pt on indocin 25 mg by mouth BID. Order printed and signed by Dr. Brett Fairy, faxed to [facility] on [address]. Received a receipt of confirmation.  I spoke to pt and advised her of this. Pt verbalized understanding and appreciation.  Dr. Brett Fairy, this is just an update from our conversation earlier. If there is anything else you need me to do, let me know.

## 2015-06-03 NOTE — Telephone Encounter (Signed)
-----   Message from Larey Seat, MD sent at 06/02/2015  5:20 PM EDT ----- Normal for clinical situation. CD

## 2015-06-03 NOTE — Telephone Encounter (Signed)
Left a message on pt's home phone asking her to call me back. Pt's labs were normal.

## 2015-06-03 NOTE — Telephone Encounter (Signed)
Noted, thank you

## 2015-06-03 NOTE — Telephone Encounter (Signed)
  In your note it says to start the pt on indomethacin, however, there is not an order placed. Can you place the order and I will make sure it goes to [facility].

## 2015-06-03 NOTE — Telephone Encounter (Signed)
Patient called regarding lab results. I advised patient her labs were normal per note below.

## 2015-06-04 MED ORDER — INDOMETHACIN 25 MG PO CAPS: 25.0000 mg | ORAL_CAPSULE | Freq: Two times a day (BID) | ORAL | Status: DC

## 2015-06-12 ENCOUNTER — Other Ambulatory Visit: Payer: Self-pay | Admitting: Neurology

## 2015-07-05 DIAGNOSIS — Z136 Encounter for screening for cardiovascular disorders: Secondary | ICD-10-CM | POA: Diagnosis not present

## 2015-07-05 DIAGNOSIS — F319 Bipolar disorder, unspecified: Secondary | ICD-10-CM | POA: Diagnosis not present

## 2015-07-05 DIAGNOSIS — Z0000 Encounter for general adult medical examination without abnormal findings: Secondary | ICD-10-CM | POA: Diagnosis not present

## 2015-07-05 DIAGNOSIS — I10 Essential (primary) hypertension: Secondary | ICD-10-CM | POA: Diagnosis not present

## 2015-07-13 ENCOUNTER — Other Ambulatory Visit: Payer: Self-pay | Admitting: Neurology

## 2015-08-10 ENCOUNTER — Other Ambulatory Visit: Payer: Self-pay | Admitting: Neurology

## 2015-08-18 ENCOUNTER — Ambulatory Visit (INDEPENDENT_AMBULATORY_CARE_PROVIDER_SITE_OTHER): Payer: PPO | Admitting: Neurology

## 2015-08-18 ENCOUNTER — Encounter: Payer: Self-pay | Admitting: Neurology

## 2015-08-18 ENCOUNTER — Other Ambulatory Visit: Payer: Self-pay | Admitting: Neurology

## 2015-08-18 VITALS — BP 112/82 | HR 86 | Resp 20 | Ht 70.0 in | Wt 251.0 lb

## 2015-08-18 DIAGNOSIS — G43011 Migraine without aura, intractable, with status migrainosus: Secondary | ICD-10-CM

## 2015-08-18 MED ORDER — ONDANSETRON HCL 4 MG PO TABS: 4.0000 mg | ORAL_TABLET | Freq: Three times a day (TID) | ORAL | Status: DC | PRN

## 2015-08-18 MED ORDER — VALPROATE SODIUM 500 MG/5ML IV SOLN: 500.0000 mg | Freq: Once | INTRAVENOUS | Status: DC | PRN

## 2015-08-18 MED ORDER — DIVALPROEX SODIUM ER 500 MG PO TB24: 1500.0000 mg | ORAL_TABLET | Freq: Every day | ORAL | Status: DC

## 2015-08-18 NOTE — Addendum Note (Signed)
Addended by: Larey Seat on: 08/18/2015 10:24 AM   Modules accepted: Orders

## 2015-08-18 NOTE — Patient Instructions (Signed)
   Depakone infusion , Zofran 4 mg iv.

## 2015-08-18 NOTE — Progress Notes (Signed)
[facility]   Provider:  Larey Seat, M D  Referring Provider: Aretta Nip, MD Primary Care Physician:  Aretta Nip, MD   HPI:  [name] is a 45 y.o. female , seen here as a referral  from Dr. Caprice Beaver for a sleep consultation.  [name] has been followed in this office for migraine headaches but is today referred for a sleep consultation. She endorsed the Epworth sleepiness score between 13 and 15 points during her last visit with a psychiatrist. She also reports more myoclonic appearing jerks, both lower extremities with jump at the same time when she is just ready to fall asleep and prolonged her sleep latency thereby.  Chief complaint according to patient : "I fall asleep all the time. ";  " I have more migraines- they split right in the middle , affect my left face ".   Sleep habits are as follows: The patient reports that she does not have any screen light or lights penetrating her bedroom which is described as core, quiet and dark. She sleeps alone in the bedroom. She usually goes to bed around 11:30 and 12 PM, usually it will take her about 30 minutes to fall asleep. She has noticed that both legs seem to gels and get jumpy by the time she feels ready to enter sleep. She finds herself them again unable to fall asleep. These may not to be restless legs ;she does not describe creepy crawly sensations, rather myoclonic jerks. She has fallen out of bed several times sometimes in relation to visit nightmarish dreams being chased being under threat fearing for her life. These visit dreams are very detailed areas she has installed bed rails to prevent falling. She does usually not sleep walk. Mother has not witnessed any sleep talking either, but yelling . She lives in the same home as her mother, the bedrooms are across the hall from each other. Her mother has sometimes woken her when she seemed to have a nightmare and may have yelled or acted out.  Nightmares seem to occur within about 2 hours of sleep initiation. Usually only once per night. They don't occur every night either but perhaps 4 or 5 times a month. She states that she scrambles to switch the light on and that that gives her certainty that her dream is not reality. She has placed a flashlight on her nightstand for that reason. She has woken up finding that the nightstand had been moved or  she finds herself on the floor. If she has no appointments to keep in the morning she rises by about 9 AM, she relies on an alarm and she often hits the snooze button repeatedly. Most of the time she is neither refreshed nor restored in the morning. Some morning she will wake up with a dull headache throbbing, this may lift as the day goes on. Other nights she will wake up from a sharp stabbing headache that has a attack character. These cluster headaches usually occur more towards the morning hours but do not occur usually was in the first hour of sleep. She treats these with Imitrex. She will try to sleep these headaches off.  Social history: The patient will have a caffeine intake of about 2 sodas a day plus iced teas, rarely coffee. She will drink tea also for lunch or dinner. Non smoker, non alcohol drinker. Single , graduate of a local college.   Hypersomnia with extreme fatigue and depression. She is finally  gainfully employed.   Mother has insomnia, has trouble falling asleep and staying asleep. Father sleep habits are not known. Brother seems not to snore or have apnea.  Split study with capnography and expanded EEG montage.            I will also order an IV treatment for the patient's migrainous headache that she presented with today.            Depakote 500 mg IV with 25 mg of Medrol. If partial relief from headaches, this IV can be repeated once.     02-17-15,  [name] is here today for her revisit after sleep study. Her PSG revealed only mild obstructive sleep apnea the overall AHI  was 8.3 exacerbated during REM sleep during REM sleep her AHI was 21.8. She had about 23% of the night spent in rem sleep which is highly unusual for patient on multiple psychotropic medications and antidepressants. She has reported nightmares and vivid dreams and there was some sleep related talking and even at one time yelling. I did not find a sleep correlation to her morning headaches and episodic nocturnal cluster headaches. The high degree of sleepiness and fatigue remains unexplained. I would offer her to treat this mild degree of apnea given her comorbidities. The patient slept rather well after 1:30 AM but still had 3 brief periods of wakefulness. Again the use of CPAP would be optional in her case but my concern is that it would make her insomnia worse. She slept all night supine.   Interval history from 05/31/2015. [name] underwent a second cataract surgery. Her eyes are doing well, there were no complications no infections etc. She has developed a left temporal headache and is here today to see if she can get some Depakote infusion. I will also write for Indocin given that she has hours of throbbing left-sided headaches associated with some nausea.  Interval history from 08/18/2015. Ms. [name] is here today with a headache of a duration of 6 days. Since she has had relief with Depakote infusions I will order this for her today. I may also use a low-dose Solu-Medrol in the mix. I will also order some Zofran to be given to help with the nausea.    Review of Systems: Out of a complete 14 system review, the patient complains of only the following symptoms, and all other reviewed systems are negative.  How likely are you to doze in the following situations: 0 = not likely, 1 = slight chance, 2 = moderate chance, 3 = high chance  Sitting and Reading? 1 Watching Television? 1 Sitting inactive in a public place (theater or meeting)? 1 Lying down in the afternoon when circumstances permit?  2 Sitting and talking to someone? 1 Sitting quietly after lunch without alcohol? 1 In a car, while stopped for a few minutes in traffic?1 As a passenger in a car for an hour without a break? 2  Total = 10    Epworth score  10 ,  Fatigue severity score 53  , depression score PHQ9    Social History   Social History  . Marital Status: Single    Spouse Name: N/A  . Number of Children: 0  . Years of Education: N/A   Occupational History  .  [employer]   Social History Main Topics  . Smoking status: Never Smoker   . Smokeless tobacco: Never Used  . Alcohol Use: No  . Drug Use: No  . Sexual Activity: No  Other Topics Concern  . Not on file   Social History Narrative   Caffeine  2 sodas daily, 1 cup coffee daily.    Family History  Problem Relation Age of Onset  . Hypertension Mother   . Hyperlipidemia Mother   . Heart attack Father   . Heart disease Father   . Hypertension Father   . Bipolar disorder Father   . Diabetes Paternal Grandfather   . Heart disease Maternal Aunt   . Breast cancer Maternal Aunt   . Heart disease Maternal Grandmother   . Cancer Maternal Grandmother     colon    Past Medical History  Diagnosis Date  . Bipolar disorder ([place])   . Eczema   . Pes planus   . Achilles tendinitis   . Hypertension   . Migraine   . Achilles tendinitis   . Ganglion cyst 09/29/2009    left wrist (2 cyst)  . Arthritis   . Allergy   . Lipoma   . ADHD (attention deficit hyperactivity disorder)   . Hyperprolactinemia ([place])   . Bipolar affective ([place])   . Personality disorder     Past Surgical History  Procedure Laterality Date  . Tumor resection left thigh    . Ankle surgery  12/88    left   . Lipoma removal    . Ganglion cyst excision  2011  . Chest nodule  1990?    rt chest wall nodule removal  . Shoulder surgery  01/13/2011    right, partial tear  . Right bunioectomy      Current Outpatient Prescriptions  Medication Sig Dispense Refill  .  azelastine (ASTELIN) 0.1 % nasal spray instill 1 to 2 sprays into each nostril twice a day  0  . divalproex (DEPAKOTE ER) 500 MG 24 hr tablet take 3 tablets by mouth at bedtime 90 tablet 1  . hydrochlorothiazide (HYDRODIURIL) 25 MG tablet Take 1 tablet (25 mg total) by mouth daily. For blood pressure control. 30 tablet 0  . hydrOXYzine (VISTARIL) 50 MG capsule take 1 capsule by mouth twice a day and 1 capsule at bedtime  0  . ibuprofen (ADVIL,MOTRIN) 800 MG tablet Take 1 tablet (800 mg total) by mouth every 8 (eight) hours as needed. 90 tablet 0  . indomethacin (INDOCIN) 25 MG capsule take 1 capsule by mouth twice a day with meals 30 capsule 0  . lamoTRIgine (LAMICTAL) 200 MG tablet Take 1 tablet (200 mg total) by mouth at bedtime. For mood stabilization 30 tablet 0  . lurasidone (LATUDA) 80 MG TABS tablet Take 1 tablet (80 mg total) by mouth daily at 6 PM. For mood control 30 tablet 0  . metoprolol succinate (TOPROL-XL) 100 MG 24 hr tablet Take 1 tablet (100 mg total) by mouth daily. Take with or immediately following a meal for blood pressure control. 30 tablet 0  . Multiple Vitamin (MULTIVITAMIN WITH MINERALS) TABS tablet Take 1 tablet by mouth daily. For nutritional supplementation. 30 tablet 0  . promethazine (PHENERGAN) 25 MG tablet Take 1 tablet (25 mg total) by mouth every 6 (six) hours as needed for nausea or vomiting. 30 tablet 1  . sodium chloride 0.9 % SOLN 100 mL with valproate 500 MG/5ML SOLN 500 mg Inject 500 mg into the vein once as needed. To be infused over  a 15 minutes period, repeat once if partial relief.    . sodium chloride 0.9 % SOLN 100 mL with valproate 500 MG/5ML SOLN 500 mg Inject  500 mg into the vein once. 500 mg 1  . SUMAtriptan (IMITREX) 6 MG/0.5ML SOLN injection Inject 0.5 mLs (6 mg total) into the skin every 2 (two) hours as needed for migraine or headache. May repeat in 2 hours if headache persists or recurs.  0  . zolpidem (AMBIEN) 10 MG tablet Take 10 mg by mouth at  bedtime.     No current facility-administered medications for this visit.    Allergies as of 08/18/2015 - Review Complete 08/18/2015  Allergen Reaction Noted  . Adhesive [tape] Itching and Rash 09/12/2010  . Dilaudid [hydromorphone hcl] Itching 04/18/2011  . Morphine Nausea And Vomiting 04/06/2009  . Penicillins Hives 04/06/2009  . Percocet [oxycodone-acetaminophen] Itching 04/18/2011  . Prednisone Hives 10/15/2009  . Provera [medroxyprogesterone acetate] Other (See Comments) 08/08/2010  . Ultram [tramadol hcl] Itching 04/18/2011    Vitals: BP 112/82 mmHg  Pulse 86  Resp 20  Ht 5\' 10"  (1.778 m)  Wt 251 lb (113.853 kg)  BMI 36.01 kg/m2 Last Weight:  Wt Readings from Last 1 Encounters:  08/18/15 251 lb (113.853 kg)   [id number] mass index is 36.01 kg/(m^2).     Last Height:   Ht Readings from Last 1 Encounters:  08/18/15 5\' 10"  (1.778 m)    Physical exam:  General: The patient is awake, alert and appears not in acute distress. The patient is well groomed. Head: Normocephalic, atraumatic. Neck is supple. Mallampati 2  neck circumference: 15. Nasal airflow unrestricted , TMJ is not  evident . Retrognathia is not seen, rather prognathia .  Cardiovascular:  Regular rate and rhythm , without  murmurs or carotid bruit, and without distended neck veins. Respiratory: Lungs are clear to auscultation. Skin:  Without evidence of edema, or rash Trunk: BMI is 36 Neurologic exam : The patient is awake and alert, oriented to place and time.   Attention span & concentration ability appears normal.  Speech is fluent,  without dysarthria, dysphonia or aphasia.  Mood and affect are appropriate. She is not depressed, rather aloof.   Cranial nerves: Pupils are equal and briskly reactive to light. Funduscopic exam ; beginning Cataracts. Diplopia.  Extraocular movements  in vertical and horizontal planes intact and without nystagmus. Visual fields by finger perimetry are intact. Hearing to  finger rub intact.  Facial sensation intact to fine touch. Motor exam:  Normal tone, muscle bulk and symmetric strength in all extremities. Finger-to-nose maneuver normal without evidence of ataxia, dysmetria or tremor. Gait and station: Patient walks without assistive device and is able unassisted to climb up to the exam table. Strength within normal limits.  Stance is stable and normal based . Toe and heel stand were tested , and normal. Tandem gait is unfragmented. Turns with  3 Steps. Romberg testing is negative. Deep tendon reflexes: in the  upper and lower extremities are symmetric and intact. Babinski maneuver response is downgoing.   The patient was advised of the nature of the diagnosed sleep disorder , the treatment options and risks for general a health and wellness arising from not treating the condition.  I spent more than 25 minutes of face to face time with the patient.  Greater than 50% of time was spent in counseling and coordination of care. We have discussed the diagnosis and differential and I answered the patient's questions.     Assessment:  After physical and neurologic examination, review of laboratory studies,  Personal review of imaging studies, reports of other /same imaging studies,  Results of EEG  and  neurophysiology testing and pre-existing psychiatric records as far as provided in visit., my assessment is - mild OSA, no hypoxemia and Insomnia.   1) Mixed episode bipolar disease type 2 . In August she had some medication adjustment but she has stayed on that to water which seems to stabilize her. The dreaming and insomnia can well be related to her bipolar disorder.   2) the patient describes Cluster headaches, headaches that wake her out of sleep with a stabbing sharp character as if penetrating the eye or temple. Cluster headaches occur mostly in slow-wave sleep. She's     not sure about the time of the night when these occur but it may be in the later hours of the  night. She did not have these during her PSG.   3) patient reports status migrainosus on day 6 today on 08/18/2015. She will receive a migraine cocktail here today and I will write for Zofran as tablets for home as well as for dose to be given here.  I encouraged the patient to speak to Dr. Zadie Rhine about a referral to a nutritionist to do some diet adjustment by elimination. If there are for triggers she needs to reintroduce certain foods at a weekly interval to see what may trigger the headaches. Caffeine is to be restricted to the morning hours only,  Patient sometimes wakes up with a headache of a different quality a more dull and throbbing headache.  There may be some impact from the caffeine intake especially of late in the afternoon. We will discussed sleep hygiene in detail including reducing fluid intake after 6 PM reducing caffeine intake afternoon, and seeking exposure to bright daylight in the morning for at least half an hour to stimulate wakefulness.  A routine exercise program would be beneficial but it could just contain a walking schedule. She has joined the [employer].  We agreed after 20 minute discussion not to treat this mild OSA with CPAP, as she has no hypoxemia of significance.   Marland Kitchen She will get a depakote infusion.  Stay away from caffeine!!  Asencion Partridge Demosthenes Virnig MD  08/18/2015   CC: Aretta Nip, [place], [place] [address]   RV with NP in 85 month

## 2015-08-19 NOTE — Telephone Encounter (Signed)
RX for imitrex faxed to [facility]. Received a receipt of confirmation.

## 2015-08-20 DIAGNOSIS — Z6837 Body mass index (BMI) 37.0-37.9, adult: Secondary | ICD-10-CM | POA: Diagnosis not present

## 2015-08-20 DIAGNOSIS — N898 Other specified noninflammatory disorders of vagina: Secondary | ICD-10-CM | POA: Diagnosis not present

## 2015-08-24 ENCOUNTER — Telehealth: Payer: Self-pay

## 2015-08-24 NOTE — Telephone Encounter (Signed)
I completed pa for ondansetron and faxed to [facility]. Should have a determination in 3-5 business days.

## 2015-08-26 DIAGNOSIS — G43011 Migraine without aura, intractable, with status migrainosus: Secondary | ICD-10-CM | POA: Diagnosis not present

## 2015-09-20 DIAGNOSIS — H01001 Unspecified blepharitis right upper eyelid: Secondary | ICD-10-CM | POA: Diagnosis not present

## 2015-09-20 DIAGNOSIS — H01004 Unspecified blepharitis left upper eyelid: Secondary | ICD-10-CM | POA: Diagnosis not present

## 2015-09-20 DIAGNOSIS — Z961 Presence of intraocular lens: Secondary | ICD-10-CM | POA: Diagnosis not present

## 2015-09-20 DIAGNOSIS — H04123 Dry eye syndrome of bilateral lacrimal glands: Secondary | ICD-10-CM | POA: Diagnosis not present

## 2015-10-06 ENCOUNTER — Ambulatory Visit (INDEPENDENT_AMBULATORY_CARE_PROVIDER_SITE_OTHER): Payer: PPO | Admitting: Family Medicine

## 2015-10-06 ENCOUNTER — Encounter: Payer: Self-pay | Admitting: Family Medicine

## 2015-10-06 DIAGNOSIS — M79645 Pain in left finger(s): Secondary | ICD-10-CM | POA: Diagnosis not present

## 2015-10-06 MED ORDER — MELOXICAM 15 MG PO TABS: 15.0000 mg | ORAL_TABLET | Freq: Every day | ORAL | 2 refills | Status: DC

## 2015-10-06 NOTE — Patient Instructions (Signed)
You sprained the UCL of your thumb. Wear the brace as often as possible to rest this. Icing 15 minutes at a time 3-4 times a day. Meloxicam 15mg  daily with food for pain and inflammation. Follow up with me in 2 weeks for reevaluation.

## 2015-10-07 DIAGNOSIS — M79645 Pain in left finger(s): Secondary | ICD-10-CM | POA: Insufficient documentation

## 2015-10-07 NOTE — Assessment & Plan Note (Signed)
unusual in that patient does not report an injury but exam fits with Grade 2 sprain of UCL at MCP.  Arthritis could cause similar pain but wouldn't expect laxity and no remote history of injury.  Will start with thumb spica brace or thumbkeeper.  Icing, meloxicam.  F/u in 2 weeks.

## 2015-10-07 NOTE — Progress Notes (Signed)
PCP: Aretta Nip, MD  Subjective:   HPI: Patient is a 45 y.o. female here for left thumb pain.  Patient denies known injury. Patient reports for 2 weeks she's had pain in left thumb, some swelling. Difficulty bending all the way. Pain level 7/10 and sharp pointing to MCP as area of pain. No catching or locking. Worse with any motions of the thumb. Is right handed.  Past Medical History:  Diagnosis Date  . Achilles tendinitis   . Achilles tendinitis   . ADHD (attention deficit hyperactivity disorder)   . Allergy   . Arthritis   . Bipolar affective ([place])   . Bipolar disorder ([place])   . Eczema   . Ganglion cyst 09/29/2009   left wrist (2 cyst)  . Hyperprolactinemia ([place])   . Hypertension   . Lipoma   . Migraine   . Personality disorder   . Pes planus     Current Outpatient Prescriptions on File Prior to Visit  Medication Sig Dispense Refill  . azelastine (ASTELIN) 0.1 % nasal spray instill 1 to 2 sprays into each nostril twice a day  0  . divalproex (DEPAKOTE ER) 500 MG 24 hr tablet Take 3 tablets (1,500 mg total) by mouth at bedtime. 90 tablet 1  . hydrochlorothiazide (HYDRODIURIL) 25 MG tablet Take 1 tablet (25 mg total) by mouth daily. For blood pressure control. 30 tablet 0  . hydrOXYzine (VISTARIL) 50 MG capsule take 1 capsule by mouth twice a day and 1 capsule at bedtime  0  . ibuprofen (ADVIL,MOTRIN) 800 MG tablet Take 1 tablet (800 mg total) by mouth every 8 (eight) hours as needed. 90 tablet 0  . indomethacin (INDOCIN) 25 MG capsule take 1 capsule by mouth twice a day with meals 30 capsule 0  . lamoTRIgine (LAMICTAL) 200 MG tablet Take 1 tablet (200 mg total) by mouth at bedtime. For mood stabilization 30 tablet 0  . lurasidone (LATUDA) 80 MG TABS tablet Take 1 tablet (80 mg total) by mouth daily at 6 PM. For mood control 30 tablet 0  . metoprolol succinate (TOPROL-XL) 100 MG 24 hr tablet Take 1 tablet (100 mg total) by mouth daily. Take with or immediately  following a meal for blood pressure control. 30 tablet 0  . Multiple Vitamin (MULTIVITAMIN WITH MINERALS) TABS tablet Take 1 tablet by mouth daily. For nutritional supplementation. 30 tablet 0  . ondansetron (ZOFRAN) 4 MG tablet Take 1 tablet (4 mg total) by mouth every 8 (eight) hours as needed for nausea or vomiting. 20 tablet 0  . promethazine (PHENERGAN) 25 MG tablet Take 1 tablet (25 mg total) by mouth every 6 (six) hours as needed for nausea or vomiting. 30 tablet 1  . sodium chloride 0.9 % SOLN 100 mL with valproate 500 MG/5ML SOLN 500 mg Inject 500 mg into the vein once as needed. To be infused over  a 15 minutes period, repeat once if partial relief.    . sodium chloride 0.9 % SOLN 100 mL with valproate 500 MG/5ML SOLN 500 mg Inject 500 mg into the vein once. 500 mg 1  . sodium chloride 0.9 % SOLN 100 mL with valproate 500 MG/5ML SOLN 500 mg Inject 500 mg into the vein once as needed. To be infused over  a 15 minutes period, repeat once if partial relief. 500 mg 1  . SUMAtriptan (IMITREX) 6 MG/0.5ML SOLN injection Inject 0.5 mLs (6 mg total) into the skin every 2 (two) hours as needed for migraine  or headache. May repeat in 2 hours if headache persists or recurs.  0  . SUMAtriptan Succinate Refill 6 MG/0.5ML SOCT inject INTO THE SKIN AS NEEDED FOR MIGRAINE OR HEADACHE;  MAY REPEAT IN 2 HOURS IF HEADACHE PERSISTS OR RECURS 3 cartridge 5  . zolpidem (AMBIEN) 10 MG tablet Take 10 mg by mouth at bedtime.     No current facility-administered medications on file prior to visit.     Past Surgical History:  Procedure Laterality Date  . ANKLE SURGERY  12/88   left   . chest nodule  1990?   rt chest wall nodule removal  . GANGLION CYST EXCISION  2011  . lipoma removal    . right bunioectomy    . SHOULDER SURGERY  01/13/2011   right, partial tear  . tumor resection left thigh      Allergies  Allergen Reactions  . Adhesive [Tape] Itching and Rash    Also reacted to Steri Strips and  Band-Aids.  . Dilaudid [Hydromorphone Hcl] Itching  . Morphine Nausea And Vomiting  . Penicillins Hives  . Percocet [Oxycodone-Acetaminophen] Itching  . Prednisone Hives  . Provera [Medroxyprogesterone Acetate] Other (See Comments)    Causes manic episodes  . Ultram [Tramadol Hcl] Itching    Social History   Social History  . Marital status: Single    Spouse name: N/A  . Number of children: 0  . Years of education: N/A   Occupational History  .  [employer]   Social History Main Topics  . Smoking status: Never Smoker  . Smokeless tobacco: Never Used  . Alcohol use No  . Drug use: No  . Sexual activity: No   Other Topics Concern  . Not on file   Social History Narrative   Caffeine  2 sodas daily, 1 cup coffee daily.    Family History  Problem Relation Age of Onset  . Hypertension Mother   . Hyperlipidemia Mother   . Heart attack Father   . Heart disease Father   . Hypertension Father   . Bipolar disorder Father   . Diabetes Paternal Grandfather   . Heart disease Maternal Aunt   . Breast cancer Maternal Aunt   . Heart disease Maternal Grandmother   . Cancer Maternal Grandmother     colon    BP 110/76   Pulse 64   Ht 5\' 10"  (1.778 m)   Wt 252 lb (114.3 kg)   BMI 36.16 kg/m   Review of Systems: See HPI above.    Objective:  Physical Exam:  Gen: NAD, comfortable in exam room  Left thumb: No gross deformity, swelling, bruising. TTP MCP greatest over UCL.  No tenderness 1st dorsal compartment, CMC, elsewhere about thumb or hand. FROM at IP, MCP, CMC joints. Negative finkelsteins Negative tinels 5/5 strength at IP and MCP joints. Laxity of UCL at MCP but with end point compared to no laxity right thumb. NVI distally.    Assessment & Plan:  1. Left thumb pain - unusual in that patient does not report an injury but exam fits with Grade 2 sprain of UCL at MCP.  Arthritis could cause similar pain but wouldn't expect laxity and no remote history of injury.   Will start with thumb spica brace or thumbkeeper.  Icing, meloxicam.  F/u in 2 weeks.

## 2015-10-13 DIAGNOSIS — R21 Rash and other nonspecific skin eruption: Secondary | ICD-10-CM | POA: Diagnosis not present

## 2015-10-13 DIAGNOSIS — J3089 Other allergic rhinitis: Secondary | ICD-10-CM | POA: Diagnosis not present

## 2015-10-13 DIAGNOSIS — J301 Allergic rhinitis due to pollen: Secondary | ICD-10-CM | POA: Diagnosis not present

## 2015-10-20 ENCOUNTER — Ambulatory Visit (INDEPENDENT_AMBULATORY_CARE_PROVIDER_SITE_OTHER): Payer: PPO | Admitting: Family Medicine

## 2015-10-20 ENCOUNTER — Encounter: Payer: Self-pay | Admitting: Family Medicine

## 2015-10-20 DIAGNOSIS — M79645 Pain in left finger(s): Secondary | ICD-10-CM | POA: Diagnosis not present

## 2015-10-20 NOTE — Patient Instructions (Signed)
Your exam is consistent with extensor tendinitis of your thumb. Wear the brace as often as possible to rest this. Start hand/occupational therapy - [facility] [place]. Icing 15 minutes at a time 3-4 times a day. Meloxicam 15mg  daily with food for pain and inflammation. Follow up with me about 4 weeks after starting therapy.

## 2015-10-21 NOTE — Progress Notes (Signed)
PCP: Aretta Nip, MD  Subjective:   HPI: Patient is a 45 y.o. female here for left thumb pain.  9/6: Patient denies known injury. Patient reports for 2 weeks she's had pain in left thumb, some swelling. Difficulty bending all the way. Pain level 7/10 and sharp pointing to MCP as area of pain. No catching or locking. Worse with any motions of the thumb. Is right handed.  9/20: Patient reports she's still having pain in left thumb. Feels more on dorsal aspect of this thumb now. Pain level 6/10, sharp. Better when in the thumbkeeper brace. No swelling. No skin changes. No new injuries. No catching or locking. No numbness or tingling.  Past Medical History:  Diagnosis Date  . Achilles tendinitis   . Achilles tendinitis   . ADHD (attention deficit hyperactivity disorder)   . Allergy   . Arthritis   . Bipolar affective ([place])   . Bipolar disorder ([place])   . Eczema   . Ganglion cyst 09/29/2009   left wrist (2 cyst)  . Hyperprolactinemia ([place])   . Hypertension   . Lipoma   . Migraine   . Personality disorder   . Pes planus     Current Outpatient Prescriptions on File Prior to Visit  Medication Sig Dispense Refill  . azelastine (ASTELIN) 0.1 % nasal spray instill 1 to 2 sprays into each nostril twice a day  0  . divalproex (DEPAKOTE ER) 500 MG 24 hr tablet Take 3 tablets (1,500 mg total) by mouth at bedtime. 90 tablet 1  . hydrochlorothiazide (HYDRODIURIL) 25 MG tablet Take 1 tablet (25 mg total) by mouth daily. For blood pressure control. 30 tablet 0  . hydrOXYzine (VISTARIL) 50 MG capsule take 1 capsule by mouth twice a day and 1 capsule at bedtime  0  . ibuprofen (ADVIL,MOTRIN) 800 MG tablet Take 1 tablet (800 mg total) by mouth every 8 (eight) hours as needed. 90 tablet 0  . indomethacin (INDOCIN) 25 MG capsule take 1 capsule by mouth twice a day with meals 30 capsule 0  . lamoTRIgine (LAMICTAL) 200 MG tablet Take 1 tablet (200 mg total) by mouth at bedtime. For  mood stabilization 30 tablet 0  . lurasidone (LATUDA) 80 MG TABS tablet Take 1 tablet (80 mg total) by mouth daily at 6 PM. For mood control 30 tablet 0  . meloxicam (MOBIC) 15 MG tablet Take 1 tablet (15 mg total) by mouth daily. 30 tablet 2  . metoprolol succinate (TOPROL-XL) 100 MG 24 hr tablet Take 1 tablet (100 mg total) by mouth daily. Take with or immediately following a meal for blood pressure control. 30 tablet 0  . Multiple Vitamin (MULTIVITAMIN WITH MINERALS) TABS tablet Take 1 tablet by mouth daily. For nutritional supplementation. 30 tablet 0  . ondansetron (ZOFRAN) 4 MG tablet Take 1 tablet (4 mg total) by mouth every 8 (eight) hours as needed for nausea or vomiting. 20 tablet 0  . promethazine (PHENERGAN) 25 MG tablet Take 1 tablet (25 mg total) by mouth every 6 (six) hours as needed for nausea or vomiting. 30 tablet 1  . sodium chloride 0.9 % SOLN 100 mL with valproate 500 MG/5ML SOLN 500 mg Inject 500 mg into the vein once as needed. To be infused over  a 15 minutes period, repeat once if partial relief.    . sodium chloride 0.9 % SOLN 100 mL with valproate 500 MG/5ML SOLN 500 mg Inject 500 mg into the vein once. 500 mg 1  .  sodium chloride 0.9 % SOLN 100 mL with valproate 500 MG/5ML SOLN 500 mg Inject 500 mg into the vein once as needed. To be infused over  a 15 minutes period, repeat once if partial relief. 500 mg 1  . SUMAtriptan (IMITREX) 6 MG/0.5ML SOLN injection Inject 0.5 mLs (6 mg total) into the skin every 2 (two) hours as needed for migraine or headache. May repeat in 2 hours if headache persists or recurs.  0  . SUMAtriptan Succinate Refill 6 MG/0.5ML SOCT inject INTO THE SKIN AS NEEDED FOR MIGRAINE OR HEADACHE;  MAY REPEAT IN 2 HOURS IF HEADACHE PERSISTS OR RECURS 3 cartridge 5  . zolpidem (AMBIEN) 10 MG tablet Take 10 mg by mouth at bedtime.     No current facility-administered medications on file prior to visit.     Past Surgical History:  Procedure Laterality Date   . ANKLE SURGERY  12/88   left   . chest nodule  1990?   rt chest wall nodule removal  . GANGLION CYST EXCISION  2011  . lipoma removal    . right bunioectomy    . SHOULDER SURGERY  01/13/2011   right, partial tear  . tumor resection left thigh      Allergies  Allergen Reactions  . Adhesive [Tape] Itching and Rash    Also reacted to Steri Strips and Band-Aids.  . Dilaudid [Hydromorphone Hcl] Itching  . Morphine Nausea And Vomiting  . Penicillins Hives  . Percocet [Oxycodone-Acetaminophen] Itching  . Prednisone Hives  . Provera [Medroxyprogesterone Acetate] Other (See Comments)    Causes manic episodes  . Ultram [Tramadol Hcl] Itching    Social History   Social History  . Marital status: Single    Spouse name: N/A  . Number of children: 0  . Years of education: N/A   Occupational History  .  [employer]   Social History Main Topics  . Smoking status: Never Smoker  . Smokeless tobacco: Never Used  . Alcohol use No  . Drug use: No  . Sexual activity: No   Other Topics Concern  . Not on file   Social History Narrative   Caffeine  2 sodas daily, 1 cup coffee daily.    Family History  Problem Relation Age of Onset  . Hypertension Mother   . Hyperlipidemia Mother   . Heart attack Father   . Heart disease Father   . Hypertension Father   . Bipolar disorder Father   . Diabetes Paternal Grandfather   . Heart disease Maternal Aunt   . Breast cancer Maternal Aunt   . Heart disease Maternal Grandmother   . Cancer Maternal Grandmother     colon    BP 112/79   Pulse 64   Ht 5\' 10"  (1.778 m)   Wt 252 lb (114.3 kg)   BMI 36.16 kg/m   Review of Systems: See HPI above.    Objective:  Physical Exam:  Gen: NAD, comfortable in exam room  Left thumb: No gross deformity, swelling, bruising. TTP dorsal thumb over extensor tendon.  Tenderness around MCP joint improved now.  No tenderness 1st dorsal compartment, CMC, elsewhere about thumb or hand. FROM at IP, MCP,  CMC joints.  Pain on resisted thumb extension. Negative finkelsteins Negative tinels 5/5 strength at IP and MCP joints. Mild laxity of UCL 1st MCP but no pain. NVI distally.    Assessment & Plan:  1. Left thumb pain - Pain now localized to extensors of her thumb, worse with  extension as well.  She will continue with the thumbkeeper brace.  Start hand therapy as well.  Icing, meloxicam.  Consider injection, MRI in future if not improving as expected.  F/u 4 weeks after starting therapy.

## 2015-10-21 NOTE — Assessment & Plan Note (Signed)
Pain now localized to extensors of her thumb, worse with extension as well.  She will continue with the thumbkeeper brace.  Start hand therapy as well.  Icing, meloxicam.  Consider injection, MRI in future if not improving as expected.  F/u 4 weeks after starting therapy.

## 2015-10-25 DIAGNOSIS — R32 Unspecified urinary incontinence: Secondary | ICD-10-CM | POA: Diagnosis not present

## 2015-10-25 DIAGNOSIS — B070 Plantar wart: Secondary | ICD-10-CM | POA: Diagnosis not present

## 2015-10-29 ENCOUNTER — Telehealth: Payer: Self-pay | Admitting: Family Medicine

## 2015-10-29 NOTE — Telephone Encounter (Signed)
I'm ok with her coming here - she would have to come in to look at the brace and see if we can get her a new one.

## 2015-10-29 NOTE — Telephone Encounter (Signed)
Spoke to patient and told her that I would contact PT here. Also told her to come by the office to let me look at the brace.

## 2015-11-01 NOTE — Addendum Note (Signed)
Addended by: Sherrie George F on: 11/01/2015 11:16 AM   Modules accepted: Orders

## 2015-11-12 DIAGNOSIS — F3181 Bipolar II disorder: Secondary | ICD-10-CM | POA: Diagnosis not present

## 2015-11-12 DIAGNOSIS — F900 Attention-deficit hyperactivity disorder, predominantly inattentive type: Secondary | ICD-10-CM | POA: Diagnosis not present

## 2015-11-15 ENCOUNTER — Ambulatory Visit: Payer: PPO | Attending: Family Medicine | Admitting: Occupational Therapy

## 2015-11-15 DIAGNOSIS — M25542 Pain in joints of left hand: Secondary | ICD-10-CM | POA: Insufficient documentation

## 2015-11-15 DIAGNOSIS — S0083XA Contusion of other part of head, initial encounter: Secondary | ICD-10-CM | POA: Diagnosis not present

## 2015-11-15 DIAGNOSIS — S060X0A Concussion without loss of consciousness, initial encounter: Secondary | ICD-10-CM | POA: Diagnosis not present

## 2015-11-15 DIAGNOSIS — M25642 Stiffness of left hand, not elsewhere classified: Secondary | ICD-10-CM

## 2015-11-15 DIAGNOSIS — S0990XA Unspecified injury of head, initial encounter: Secondary | ICD-10-CM | POA: Diagnosis not present

## 2015-11-15 DIAGNOSIS — R29898 Other symptoms and signs involving the musculoskeletal system: Secondary | ICD-10-CM | POA: Insufficient documentation

## 2015-11-15 DIAGNOSIS — M6281 Muscle weakness (generalized): Secondary | ICD-10-CM

## 2015-11-15 NOTE — Therapy (Signed)
[facility] [address]  [place] [place], [place], [address] Phone: [phone]   Fax:  [phone]  Occupational Therapy Evaluation  Patient Details  Name: [name] MRN: [id number] Date of Birth: 10/05/1970 Referring Provider: Dr. Karlton Lemon  Encounter Date: 11/15/2015      OT End of Session - 11/15/15 0955    Visit Number 1   Number of Visits 12   Date for OT Re-Evaluation 12/27/15   Authorization Type [employer] - G code needed   OT Start Time 0850   OT Stop Time 0940   OT Time Calculation (min) 50 min   Activity Tolerance Patient tolerated treatment well;Patient limited by pain      Past Medical History:  Diagnosis Date  . Achilles tendinitis   . Achilles tendinitis   . ADHD (attention deficit hyperactivity disorder)   . Allergy   . Arthritis   . Bipolar affective ([place])   . Bipolar disorder ([place])   . Eczema   . Ganglion cyst 09/29/2009   left wrist (2 cyst)  . Hyperprolactinemia ([place])   . Hypertension   . Lipoma   . Migraine   . Personality disorder   . Pes planus     Past Surgical History:  Procedure Laterality Date  . ANKLE SURGERY  12/88   left   . chest nodule  1990?   rt chest wall nodule removal  . GANGLION CYST EXCISION  2011  . lipoma removal    . right bunioectomy    . SHOULDER SURGERY  01/13/2011   right, partial tear  . tumor resection left thigh      There were no vitals filed for this visit.      Subjective Assessment - 11/15/15 0902    Subjective  This began when I woke up one morning   Pertinent History Rt shoulder surgery from [facility]   Patient Stated Goals to decr. pain and restore function   Currently in Pain? Yes   Pain Score 7    Pain Location --  Thumb   Pain Orientation Left   Pain Descriptors / Indicators Aching;Sharp   Pain Type Acute pain   Pain Onset More than a month ago   Pain Frequency Constant   Aggravating Factors  lifting, cutting  food, opening containers   Pain Relieving Factors ice, pain meds, brace           [facility] OT Assessment - 11/15/15 0001      Assessment   Diagnosis Pain of Lt thumb  extensor tendonitis   Referring Provider Dr. Karlton Lemon   Onset Date --  late Aug/early Sept 2017   Assessment Pt has more pain with thumb flexion than thumb radial abd. and extension. Pt positive Finklesteins today during evaluation   Prior Therapy none for this episode     Precautions   Precaution Comments no heavy lifting   Required Braces or Orthoses Other Brace/Splint   Other Brace/Splint thumb keeper brace     Balance Screen   Has the patient fallen in the past 6 months Yes   How many times? 2  Pt reports dizziness/light headedness   Has the patient had a decrease in activity level because of a fear of falling?  Yes   Is the patient reluctant to leave their home because of a fear of falling?  No     Home  Environment   Lives With --  mom  Prior Function   Level of Independence Independent   Vocation Part time employment   Engineer, agricultural associate      ADL   Eating/Feeding Needs assist with cutting food   Grooming Independent   Upper Body Bathing Independent   Lower Body Bathing Independent   Upper Body Dressing Increased time  difficulty with fastners, tying   Lower Body Dressing Increased time  difficulty with buttons, leaves shoes tied   Engineering geologist - Clothing Manipulation Modified independent   [place] Independent   ADL comments assist with grocery shopping, mod I for cleaning, cooking with min assist to lift pots/putting dish in/out of oven     Mobility   Mobility Status Independent     Written Expression   Dominant Hand Right   Handwriting --  no changes     Vision - History   Baseline Vision Wears glasses only for reading   Visual History Cataracts   Patient Visual Report --   surgery for both eyes     Cognition   Overall Cognitive Status --  ADHD,  bipolar/borderline personality disorder     Sensation   Light Touch Appears Intact   Additional Comments hypersensitivity over Lt thumb     Coordination   9 Hole Peg Test Right;Left   Right 9 Hole Peg Test 26.91 sec   Left 9 Hole Peg Test 41.25 sec     Edema   Edema mild Lt thumb     ROM / Strength   AROM / PROM / Strength AROM;Strength     AROM   Overall AROM Comments BUE AROM WNL's grossly     Left Hand AROM   L Thumb MCP 0-60 40 Degrees  (Rt = 55*)   L Thumb Radial ADduction/ABduction 0-55 70   L Thumb Palmar ADduction/ABduction 0-45 65   L Thumb Opposition to Index --  opposition to 5th digit     Hand Function   Right Hand Grip (lbs) 45 lbs   Right Hand Lateral Pinch 11.5 lbs   Right Hand 3 Point Pinch 11.5 lbs   Left Hand Grip (lbs) 20 lbs   Left Hand Lateral Pinch 3 lbs   Left 3 point pinch 3 lbs                         OT Education - 11/15/15 0957    Education provided Yes   Education Details POC and goals for O.T.    Person(s) Educated Patient   Methods Explanation   Comprehension Verbalized understanding          OT Short Term Goals - 11/15/15 1002      OT SHORT TERM GOAL #1   Title Independent with initial A/ROM HEP (All STG's due 12/06/15)   Time 3   Period Weeks   Status New     OT SHORT TERM GOAL #2   Title Pain to be 4/10 or under with light functional tasks and A/ROM ex's   Baseline b/t 7-9/10   Time 3   Period Weeks   Status New     OT SHORT TERM GOAL #3   Title Pt to verbalize understanding with task modifications, compensatory strategies and A/E needs to reduce pain   Time 3   Period Weeks   Status New           OT Long Term Goals -  11/15/15 1003      OT LONG TERM GOAL #1   Title Independent with updated strengthening HEP (All LTG's due 12/27/15)    Time 6   Period Weeks   Status New     OT LONG TERM GOAL #2   Title  Improve grip strength Lt hand by 10 lbs or greater to open jars/containers   Baseline eval = 20 lbs (Rt = 45 lbs)   Time 6   Period Weeks   Status New     OT LONG TERM GOAL #3   Title Lateral and tip pinch Lt thumb to improve by 3 lbs or greater for opening containers   Baseline eval = 3 lbs for both (Rt = 11.5 for both)    Time 6   Period Weeks   Status New     OT LONG TERM GOAL #4   Title Lt thumb MP flexion to increase to 50* or greater for functional tasks   Baseline eval = 40* (Rt = 55*)   Time 6   Period Weeks   Status New     OT LONG TERM GOAL #5   Title Improve coordination Lt hand as evidenced by decreasing speed on 9 hole peg test to 35 sec. or less   Baseline eval = 41.25 sec   Time 6   Period Weeks   Status New               Plan - 11/15/15 0958    Clinical Impression Statement Pt is a 45 y.o. female who presents to outpatient rehab for O.T. evaluation d/t pain of Lt thumb with extensor tendonitis. Pt reports pain when she woke up approx. a month and a half ago from unknown cause. Pt is now limited in ability to perform ADLS and work related tasks, and would benefit from O.T. to address incr. pain, decr. strength and ROM to maximize function during these tasks.    Rehab Potential Good   OT Frequency 2x / week   OT Duration 6 weeks   OT Treatment/Interventions Self-care/ADL training;Moist Heat;Fluidtherapy;DME and/or AE instruction;Splinting;Patient/family education;Contrast Bath;Compression bandaging;Therapeutic exercises;Ultrasound;Therapeutic activities;Cryotherapy;Iontophoresis;Passive range of motion;Electrical Stimulation;Parrafin;Manual Therapy   Plan iontophoresis if order in, ice, A/ROM ex's, task modifications/compensatory strategies   Recommended Other Services Iontophoresis   Consulted and Agree with Plan of Care Patient      Patient will benefit from skilled therapeutic intervention in order to improve the following deficits and impairments:   Decreased coordination, Decreased range of motion, Impaired flexibility, Increased edema, Impaired sensation, Decreased knowledge of use of DME, Impaired UE functional use, Pain, Decreased strength  Visit Diagnosis: Pain in joints of left hand - Plan: Ot plan of care cert/re-cert  Muscle weakness (generalized) - Plan: Ot plan of care cert/re-cert  Stiffness of left hand, not elsewhere classified - Plan: Ot plan of care cert/re-cert  Other symptoms and signs involving the musculoskeletal system - Plan: Ot plan of care cert/re-cert      G-Codes - [id number] 1007    Functional Assessment Tool Used Lt thumb pain 7/10, Lt grip = 20 lbs, Lt lateral pinch 3 lbs   Functional Limitation Carrying, moving and handling objects   Carrying, Moving and Handling Objects Current Status [id number]) At least 40 percent but less than 60 percent impaired, limited or restricted   Carrying, Moving and Handling Objects Goal Status [id number]) At least 1 percent but less than 20 percent impaired, limited or restricted      Problem List Patient Active  Problem List   Diagnosis Date Noted  . Pain of left thumb 10/07/2015  . 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 Strain of left thumb 02/11/2015  . Strain of right forearm 02/11/2015  . Right shoulder pain 12/31/2014  . Injury of right little finger 12/31/2014  . Episodic cluster headache, 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  . 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 Hypersomnia, recurrent 12/02/2014  . Migraine aura, persistent, intractable, with status migrainosus 12/02/2014  . Lower back injury 09/30/2014  . Bipolar I disorder, most recent episode depressed ([place])   . MDD (major depressive disorder), recurrent, severe, with psychosis ([place]) 09/23/2014  . Injury of left index finger 08/20/2014  . Right ankle sprain 06/01/2014  . Contusion, multiple sites 06/01/2014  . Strain of right gastrocnemius muscle  06/01/2014  . Phonophobia 05/04/2014  . Photophobia of both eyes 05/04/2014  . Emotionally unstable borderline personality disorder 05/04/2014  . Nausea with vomiting 05/04/2014  . Mixed bipolar I disorder ([place])   . Bipolar I disorder, most recent episode mixed ([place]) 04/18/2014  . Bipolar affective disorder, depressed, severe ([place]) 04/12/2014  . Suicidal ideation 04/12/2014  . Injury of right shoulder and upper arm 02/17/2014  . Left leg pain 06/03/2013  . Right hip pain 06/03/2013  . Migraine with status migrainosus 01/08/2013  . Personality disorder   . Chronic migraine 05/08/2012  . Contact dermatitis 11/27/2011  . Major depressive disorder, recurrent episode ([place]) 10/27/2011  . Generalized anxiety disorder 10/27/2011  . 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 Borderline personality disorder 10/27/2011  . Right foot pain 09/28/2011  . 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 Ganglion cyst 09/29/2009  . PES PLANUS 07/01/2008  . BIPOLAR DISORDER UNSPECIFIED 06/09/2008    Carey Bullocks, OTR/L 11/15/2015, 10:14 AM  [facility] [address]  [place], [place], [address] Phone: [phone]   Fax:  [phone]  Name: [name] MRN: [id number] Date of Birth: 01-Apr-1970

## 2015-11-16 ENCOUNTER — Encounter (HOSPITAL_COMMUNITY): Payer: Self-pay

## 2015-11-16 ENCOUNTER — Emergency Department (HOSPITAL_COMMUNITY)
Admission: EM | Admit: 2015-11-16 | Discharge: 2015-11-17 | Disposition: A | Discharge status: Home / Self Care | Payer: PPO | Attending: Emergency Medicine | Admitting: Emergency Medicine

## 2015-11-16 ENCOUNTER — Emergency Department (HOSPITAL_COMMUNITY): Payer: PPO

## 2015-11-16 DIAGNOSIS — R51 Headache: Secondary | ICD-10-CM | POA: Insufficient documentation

## 2015-11-16 DIAGNOSIS — I10 Essential (primary) hypertension: Secondary | ICD-10-CM | POA: Insufficient documentation

## 2015-11-16 DIAGNOSIS — F909 Attention-deficit hyperactivity disorder, unspecified type: Secondary | ICD-10-CM | POA: Insufficient documentation

## 2015-11-16 DIAGNOSIS — S0990XA Unspecified injury of head, initial encounter: Secondary | ICD-10-CM | POA: Diagnosis not present

## 2015-11-16 DIAGNOSIS — F0781 Postconcussional syndrome: Secondary | ICD-10-CM | POA: Diagnosis not present

## 2015-11-16 DIAGNOSIS — G44309 Post-traumatic headache, unspecified, not intractable: Secondary | ICD-10-CM

## 2015-11-16 NOTE — ED Notes (Signed)
Dr Isaacs at bedside 

## 2015-11-16 NOTE — ED Triage Notes (Signed)
Patient hit in head this past Sunday by glass cover from ceiling fan. No loc and evaluated at [facility] but requesting further evaluation for left eye orbit pain and headache. Alert and oriented, had negative xray for orbital fracture. No neuro deficits noted

## 2015-11-16 NOTE — ED Notes (Signed)
Patient stated on Sunday she was putting a globe cover on a ceiling fan and it fell and hit her on the left side of her face over her eye.  +nausea, feels drowsy.  Alert and oriented.  Pupils equal and reactive

## 2015-11-16 NOTE — ED Notes (Signed)
Patient transported to CT 

## 2015-11-16 NOTE — ED Notes (Signed)
Pt's name called to recheck vitals no answer 

## 2015-11-17 ENCOUNTER — Ambulatory Visit: Payer: PPO | Admitting: Occupational Therapy

## 2015-11-17 DIAGNOSIS — M25542 Pain in joints of left hand: Secondary | ICD-10-CM

## 2015-11-17 DIAGNOSIS — M25642 Stiffness of left hand, not elsewhere classified: Secondary | ICD-10-CM

## 2015-11-17 MED ORDER — PROMETHAZINE HCL 25 MG PO TABS: 25.0000 mg | ORAL_TABLET | Freq: Four times a day (QID) | ORAL | 0 refills | Status: DC | PRN

## 2015-11-17 MED ORDER — KETOROLAC TROMETHAMINE 60 MG/2ML IM SOLN
60.0000 mg | Freq: Once | INTRAMUSCULAR | Status: AC
  Administered 2015-11-17: 60 mg via INTRAMUSCULAR
  Filled 2015-11-17: qty 2

## 2015-11-17 MED ORDER — NAPROXEN 375 MG PO TABS: 375.0000 mg | ORAL_TABLET | Freq: Two times a day (BID) | ORAL | 0 refills | Status: DC | PRN

## 2015-11-17 MED ORDER — DIPHENHYDRAMINE HCL 50 MG/ML IJ SOLN
25.0000 mg | Freq: Once | INTRAMUSCULAR | Status: AC
  Administered 2015-11-17: 25 mg via INTRAMUSCULAR
  Filled 2015-11-17: qty 1

## 2015-11-17 MED ORDER — METOCLOPRAMIDE HCL 5 MG/ML IJ SOLN
10.0000 mg | Freq: Once | INTRAMUSCULAR | Status: AC
  Administered 2015-11-17: 10 mg via INTRAMUSCULAR
  Filled 2015-11-17: qty 2

## 2015-11-17 NOTE — ED Provider Notes (Signed)
[facility] DEPT Provider Note   CSN: [id number] Arrival date & time: 11/16/15  1709     History   Chief Complaint Chief Complaint  Patient presents with  . head injury/sunday    HPI [name] is a 45 y.o. female.  HPI 45 yo F with PMHx of bipolar disorder, chronic migraines who p/w headache. Pt states that that 2.5 days ago, she was fixing a glass cover from the ceiling fan when a large, heavy, glass light fixture that she thought was screwed in fell and hit her in her left face. She denies LOC but felt "dizzy" afterwards. Since then, she has had left periorbital pain and a constant headache that she describes as aching, throbbing, and similar to her migraines. SHe was see by her PCP and cleared after negative orbital XR but has persistent pain, so came for a second opinion. Denies any blood thinner use. No numbness or weakness. No fevers or chills. No vision changes or pain with EOM.  Past Medical History:  Diagnosis Date  . Achilles tendinitis   . Achilles tendinitis   . ADHD (attention deficit hyperactivity disorder)   . Allergy   . Arthritis   . Bipolar affective ([place])   . Bipolar disorder ([place])   . Eczema   . Ganglion cyst 09/29/2009   left wrist (2 cyst)  . Hyperprolactinemia ([place])   . Hypertension   . Lipoma   . Migraine   . Personality disorder   . Pes planus     Patient Active Problem List   Diagnosis Date Noted  . Pain of left thumb 10/07/2015  . 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 Strain of left thumb 02/11/2015  . Strain of right forearm 02/11/2015  . Right shoulder pain 12/31/2014  . Injury of right little finger 12/31/2014  . Episodic cluster headache, 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  . 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 Hypersomnia, recurrent 12/02/2014  . Migraine aura, persistent, intractable, with status migrainosus 12/02/2014  . Lower back injury 09/30/2014  .  Bipolar I disorder, most recent episode depressed ([place])   . MDD (major depressive disorder), recurrent, severe, with psychosis ([place]) 09/23/2014  . Injury of left index finger 08/20/2014  . Right ankle sprain 06/01/2014  . Contusion, multiple sites 06/01/2014  . Strain of right gastrocnemius muscle 06/01/2014  . Phonophobia 05/04/2014  . Photophobia of both eyes 05/04/2014  . Emotionally unstable borderline personality disorder 05/04/2014  . Nausea with vomiting 05/04/2014  . Mixed bipolar I disorder ([place])   . Bipolar I disorder, most recent episode mixed ([place]) 04/18/2014  . Bipolar affective disorder, depressed, severe ([place]) 04/12/2014  . Suicidal ideation 04/12/2014  . Injury of right shoulder and upper arm 02/17/2014  . Left leg pain 06/03/2013  . Right hip pain 06/03/2013  . Migraine with status migrainosus 01/08/2013  . Personality disorder   . Chronic migraine 05/08/2012  . Contact dermatitis 11/27/2011  . Major depressive disorder, recurrent episode ([place]) 10/27/2011  . Generalized anxiety disorder 10/27/2011  . 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 Borderline personality disorder 10/27/2011  . Right foot pain 09/28/2011  . 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 Ganglion cyst 09/29/2009  . PES PLANUS 07/01/2008  . BIPOLAR DISORDER UNSPECIFIED 06/09/2008    Past Surgical History:  Procedure Laterality Date  . ANKLE SURGERY  12/88   left   .  chest nodule  1990?   rt chest wall nodule removal  . GANGLION CYST EXCISION  2011  . lipoma removal    . right bunioectomy    . SHOULDER SURGERY  01/13/2011   right, partial tear  . tumor resection left thigh      OB History    No data available       Home Medications    Prior to Admission medications   Medication Sig Start Date End Date Taking? Authorizing Provider  azelastine (ASTELIN) 0.1 % nasal spray instill 1 to 2  sprays into each nostril twice a day 10/21/14  Yes Historical Provider, MD  divalproex (DEPAKOTE ER) 500 MG 24 hr tablet Take 3 tablets (1,500 mg total) by mouth at bedtime. 08/18/15  Yes Carmen Dohmeier, MD  hydrocortisone cream 0.5 % Apply 1 application topically daily as needed for itching.   Yes Historical Provider, MD  hydrOXYzine (VISTARIL) 50 MG capsule take 1 capsule by mouth twice a day and 1 capsule at bedtime 03/11/15  Yes Historical Provider, MD  ibuprofen (ADVIL,MOTRIN) 800 MG tablet Take 1 tablet (800 mg total) by mouth every 8 (eight) hours as needed. 12/10/14  Yes Dene Gentry, MD  lamoTRIgine (LAMICTAL) 200 MG tablet Take 1 tablet (200 mg total) by mouth at bedtime. For mood stabilization 09/28/14  Yes Encarnacion Slates, NP  Lurasidone HCl 120 MG TABS Take 1 tablet by mouth every evening.   Yes Historical Provider, MD  meloxicam (MOBIC) 15 MG tablet Take 1 tablet (15 mg total) by mouth daily. 10/06/15  Yes Dene Gentry, MD  metoprolol succinate (TOPROL-XL) 100 MG 24 hr tablet Take 1 tablet (100 mg total) by mouth daily. Take with or immediately following a meal for blood pressure control. 09/28/14  Yes Encarnacion Slates, NP  Multiple Vitamin (MULTIVITAMIN WITH MINERALS) TABS tablet Take 1 tablet by mouth daily. For nutritional supplementation. 09/28/14  Yes Encarnacion Slates, NP  ondansetron (ZOFRAN) 4 MG tablet Take 1 tablet (4 mg total) by mouth every 8 (eight) hours as needed for nausea or vomiting. 08/18/15  Yes Larey Seat, MD  Polyvinyl Alcohol-Povidone (REFRESH OP) Apply 1 tablet to eye daily as needed. For dry eyes   Yes Historical Provider, MD  sodium chloride 0.9 % SOLN 100 mL with valproate 500 MG/5ML SOLN 500 mg Inject 500 mg into the vein once as needed. To be infused over  a 15 minutes period, repeat once if partial relief. 12/02/14  Yes Carmen Dohmeier, MD  sodium chloride 0.9 % SOLN 100 mL with valproate 500 MG/5ML SOLN 500 mg Inject 500 mg into the vein once. 05/31/15  Yes Asencion Partridge  Dohmeier, MD  SUMAtriptan (IMITREX) 6 MG/0.5ML SOLN injection Inject 0.5 mLs (6 mg total) into the skin every 2 (two) hours as needed for migraine or headache. May repeat in 2 hours if headache persists or recurs. 09/28/14  Yes Encarnacion Slates, NP  zolpidem (AMBIEN) 10 MG tablet Take 10 mg by mouth at bedtime.   Yes Historical Provider, MD  hydrochlorothiazide (HYDRODIURIL) 25 MG tablet Take 1 tablet (25 mg total) by mouth daily. For blood pressure control. Patient not taking: Reported on 11/16/2015 09/28/14   Encarnacion Slates, NP  indomethacin (INDOCIN) 25 MG capsule take 1 capsule by mouth twice a day with meals Patient not taking: Reported on 11/16/2015 07/13/15   Larey Seat, MD  lurasidone (LATUDA) 80 MG TABS tablet Take 1 tablet (80 mg total) by mouth daily at 6  PM. For mood control Patient not taking: Reported on 11/16/2015 09/28/14   Encarnacion Slates, NP  naproxen (NAPROSYN) 375 MG tablet Take 1 tablet (375 mg total) by mouth 2 (two) times daily as needed for moderate pain. 11/17/15   Duffy Bruce, MD  promethazine (PHENERGAN) 25 MG tablet Take 1 tablet (25 mg total) by mouth every 6 (six) hours as needed for nausea or vomiting (or headache). 11/17/15   Duffy Bruce, MD  sodium chloride 0.9 % SOLN 100 mL with valproate 500 MG/5ML SOLN 500 mg Inject 500 mg into the vein once as needed. To be infused over  a 15 minutes period, repeat once if partial relief. Patient not taking: Reported on 11/16/2015 08/18/15   Larey Seat, MD  SUMAtriptan Succinate Refill 6 MG/0.5ML SOCT inject INTO THE SKIN AS NEEDED FOR MIGRAINE OR HEADACHE;  MAY REPEAT IN 2 HOURS IF HEADACHE PERSISTS OR RECURS Patient not taking: Reported on 11/16/2015 08/19/15   Larey Seat, MD    Family History Family History  Problem Relation Age of Onset  . Hypertension Mother   . Hyperlipidemia Mother   . Heart attack Father   . Heart disease Father   . Hypertension Father   . Bipolar disorder Father   . Diabetes Paternal  Grandfather   . Heart disease Maternal Aunt   . Breast cancer Maternal Aunt   . Heart disease Maternal Grandmother   . Cancer Maternal Grandmother     colon    Social History Social History  Substance Use Topics  . Smoking status: Never Smoker  . Smokeless tobacco: Never Used  . Alcohol use No     Allergies   Adhesive [tape]; Dilaudid [hydromorphone hcl]; Morphine; Penicillins; Percocet [oxycodone-acetaminophen]; Prednisone; Provera [medroxyprogesterone acetate]; and Ultram [tramadol hcl]   Review of Systems Review of Systems  Constitutional: Negative for chills and fever.  HENT: Negative for congestion, rhinorrhea and sore throat.   Eyes: Negative for visual disturbance.  Respiratory: Negative for cough, shortness of breath and wheezing.   Cardiovascular: Negative for chest pain and leg swelling.  Gastrointestinal: Negative for abdominal pain, diarrhea, nausea and vomiting.  Genitourinary: Negative for dysuria, flank pain, vaginal bleeding and vaginal discharge.  Musculoskeletal: Negative for neck pain.  Skin: Negative for rash.  Allergic/Immunologic: Negative for immunocompromised state.  Neurological: Positive for headaches. Negative for syncope.  Hematological: Does not bruise/bleed easily.  All other systems reviewed and are negative.    Physical Exam Updated Vital Signs BP 119/76   Pulse (!) 58   Temp 98.3 F (36.8 C) (Oral)   Resp 12   SpO2 100%   Physical Exam  Constitutional: She is oriented to person, place, and time. She appears well-developed and well-nourished. No distress.  HENT:  Head: Normocephalic and atraumatic.  No bruising or periorbital ecchymoses. No trauma.  Eyes: Conjunctivae and EOM are normal. Pupils are equal, round, and reactive to light. Right eye exhibits no discharge. Left eye exhibits no discharge.  Neck: Neck supple.  Cardiovascular: Normal rate, regular rhythm and normal heart sounds.  Exam reveals no friction rub.   No murmur  heard. Pulmonary/Chest: Effort normal and breath sounds normal. No respiratory distress. She has no wheezes. She has no rales.  Abdominal: She exhibits no distension.  Musculoskeletal: She exhibits no edema.  Neurological: She is alert and oriented to person, place, and time. She exhibits normal muscle tone.  Skin: Skin is warm. Capillary refill takes less than 2 seconds.  Psychiatric: She has a normal mood  and affect.  Nursing note and vitals reviewed.  Neurological Exam:  Mental Status: Alert and oriented to person, place, and time. Attention and concentration normal. Speech clear. Recent memory is intact. Cranial Nerves: Visual fields intact to confrontation in all quadrants bilaterally. EOMI and PERRLA. No nystagmus noted. Facial sensation intact at forehead, maxillary cheek, and chin/mandible bilaterally. No weakness of masticatory muscles. No facial asymmetry or weakness. Hearing grossly normal to finer rub. Uvula is midline, and palate elevates symmetrically. Normal SCM and trapezius strength. Tongue midline without fasciculations Motor: Muscle strength 5/5 in proximal and distal UE and LE bilaterally. No pronator drift. Muscle tone normal. Reflexes: 2+ and symmetrical in all four extremities.  Sensation: Intact to light touch in upper and lower extremities distally bilaterally.  Gait: Normal without ataxia. Coordination: Normal FTN bilaterally.     ED Treatments / Results  Labs (all labs ordered are listed, but only abnormal results are displayed) Labs Reviewed - No data to display  EKG  EKG Interpretation None       Radiology Ct Head Wo Contrast  Result Date: 11/17/2015 CLINICAL DATA:  45 year old female with head trauma. EXAM: CT HEAD WITHOUT CONTRAST TECHNIQUE: Contiguous axial images were obtained from the base of the skull through the vertex without intravenous contrast. COMPARISON:  Head CT dated 03/22/2010.  Brain MRI dated 10/11/2009 FINDINGS: Brain: No evidence  of acute infarction, hemorrhage, hydrocephalus, extra-axial collection or mass lesion/mass effect. Vascular: No hyperdense vessel or unexpected calcification. Skull: Normal. Negative for fracture or focal lesion. Sinuses/Orbits: No acute finding. Other: None IMPRESSION: No acute intracranial pathology. Electronically Signed   By: Anner Crete M.D.   On: 11/17/2015 00:39    Procedures Procedures (including critical care time)  Medications Ordered in ED Medications  metoCLOPramide (REGLAN) injection 10 mg (10 mg Intramuscular Given 11/17/15 0011)  diphenhydrAMINE (BENADRYL) injection 25 mg (25 mg Intramuscular Given 11/17/15 0014)  ketorolac (TORADOL) injection 60 mg (60 mg Intramuscular Given 11/17/15 0114)     Initial Impression / Assessment and Plan / ED Course  I have reviewed the triage vital signs and the nursing notes.  Pertinent labs & imaging results that were available during my care of the patient were reviewed by me and considered in my medical decision making (see chart for details).  Clinical Course    45 year old female with past medical history bipolar disorder presents with headache after traumatic injury several days ago. On arrival, vital signs are stable. Neurological exam is nonfocal. She has no periorbital ecchymoses, Battle sign, or evidence of basilar skull fracture. No hemotympanum. CT head shows no acute abnormality. Suspect possible mild postconcussive headache versus migraine as the patient does have history of the same. Headache is improved with migraine cocktail here. No red flags. No blood thinner use. Will discharge with concussion precautions and outpatient follow-up.  Final Clinical Impressions(s) / ED Diagnoses   Final diagnoses:  Post-concussion headache    New Prescriptions Discharge Medication List as of 11/17/2015 12:59 AM    START taking these medications   Details  naproxen (NAPROSYN) 375 MG tablet Take 1 tablet (375 mg total) by mouth 2  (two) times daily as needed for moderate pain., Starting Wed 11/17/2015, Print         Duffy Bruce, MD 11/17/15 [phone]

## 2015-11-17 NOTE — ED Notes (Signed)
Back from CT

## 2015-11-17 NOTE — Patient Instructions (Signed)
   AROM: Wrist Extension   .  With __LT__ palm down, bend wrist up and down KEEPING THUMB RELAXED. Repeat __15__ times per set.  Do __4-6__ sessions per day.     MP Flexion (Active)   Bend thumb to touch base of little finger, keeping tip joint straight. Repeat __15__ times. Do _4-6___ sessions per day. KEEP WRIST NEUTRAL        Composite Extension (Active)   Bring thumb up and out in hitchhiker position, fingers relaxed.  Do 15__ times. Do _4-6___ sessions per day. KEEP WRIST NEUTRAL

## 2015-11-17 NOTE — Therapy (Signed)
[facility] [address]  [place], [place], [address] Phone: [phone]   Fax:  [phone]  Occupational Therapy Treatment  Patient Details  Name: [name] MRN: [id number] Date of Birth: 1970-07-08 Referring Provider: Dr. Karlton Lemon  Encounter Date: 11/17/2015      OT End of Session - 11/17/15 0933    Visit Number 2   Number of Visits 12   Date for OT Re-Evaluation 12/27/15   Authorization Type [employer] - G code needed   OT Start Time 0850  8 min. ice massage - unbillable   OT Stop Time 0925   OT Time Calculation (min) 35 min   Activity Tolerance Patient tolerated treatment well      Past Medical History:  Diagnosis Date  . Achilles tendinitis   . Achilles tendinitis   . ADHD (attention deficit hyperactivity disorder)   . Allergy   . Arthritis   . Bipolar affective ([place])   . Bipolar disorder ([place])   . Eczema   . Ganglion cyst 09/29/2009   left wrist (2 cyst)  . Hyperprolactinemia ([place])   . Hypertension   . Lipoma   . Migraine   . Personality disorder   . Pes planus     Past Surgical History:  Procedure Laterality Date  . ANKLE SURGERY  12/88   left   . chest nodule  1990?   rt chest wall nodule removal  . GANGLION CYST EXCISION  2011  . lipoma removal    . right bunioectomy    . SHOULDER SURGERY  01/13/2011   right, partial tear  . tumor resection left thigh      There were no vitals filed for this visit.      Subjective Assessment - 11/17/15 0856    Subjective  I had a concussion and went to the ED yesterday, but they released me and cleared me for everything. Headache 6-7/10   Pertinent History Rt shoulder surgery from RTC   Patient Stated Goals to decr. pain and restore function   Currently in Pain? Yes   Pain Score 5    Pain Location --  thumb   Pain Orientation Left   Pain Type Acute pain   Pain Onset More than a month ago   Pain Frequency  Constant   Aggravating Factors  lifting, cutting food, opening containers   Pain Relieving Factors ice, pain meds, brace                      OT Treatments/Exercises (OP) - 11/17/15 0001      Exercises   Exercises Wrist;Hand     Wrist Exercises   Wrist Flexion AROM;15 reps  keeping thumb relaxed   Wrist Extension PROM;15 reps  keeping thumb relaxed     Hand Exercises   Other Hand Exercises Thumb flex/ext x 15 reps each with wrist neutral position     Modalities   Modalities Iontophoresis;Cryotherapy     Cryotherapy   Number Minutes Cryotherapy 8 Minutes   Cryotherapy Location --  dorsal thumb/wrist   Type of Cryotherapy Ice massage     Iontophoresis   Type of Iontophoresis Dexamethasone   Location base of thumb dorsally   Dose 1.2 mL, 80 mA, (dose #1)   Time 4 hrs with patch (10 min. of prep and instruction time)                 OT Education -  11/17/15 0914    Education provided Yes   Education Details iontophoresis precautions/contraindications, initial A/ROM HEP for wrist and thumb   Person(s) Educated Patient   Methods Explanation;Demonstration;Handout   Comprehension Verbalized understanding;Returned demonstration          OT Short Term Goals - 11/15/15 1002      OT SHORT TERM GOAL #1   Title Independent with initial A/ROM HEP (All STG's due 12/06/15)   Time 3   Period Weeks   Status New     OT SHORT TERM GOAL #2   Title Pain to be 4/10 or under with light functional tasks and A/ROM ex's   Baseline b/t 7-9/10   Time 3   Period Weeks   Status New     OT SHORT TERM GOAL #3   Title Pt to verbalize understanding with task modifications, compensatory strategies and A/E needs to reduce pain   Time 3   Period Weeks   Status New           OT Long Term Goals - 11/15/15 1003      OT LONG TERM GOAL #1   Title Independent with updated strengthening HEP (All LTG's due 12/27/15)    Time 6   Period Weeks   Status New     OT  LONG TERM GOAL #2   Title Improve grip strength Lt hand by 10 lbs or greater to open jars/containers   Baseline eval = 20 lbs (Rt = 45 lbs)   Time 6   Period Weeks   Status New     OT LONG TERM GOAL #3   Title Lateral and tip pinch Lt thumb to improve by 3 lbs or greater for opening containers   Baseline eval = 3 lbs for both (Rt = 11.5 for both)    Time 6   Period Weeks   Status New     OT LONG TERM GOAL #4   Title Lt thumb MP flexion to increase to 50* or greater for functional tasks   Baseline eval = 40* (Rt = 55*)   Time 6   Period Weeks   Status New     OT LONG TERM GOAL #5   Title Improve coordination Lt hand as evidenced by decreasing speed on 9 hole peg test to 35 sec. or less   Baseline eval = 41.25 sec   Time 6   Period Weeks   Status New               Plan - 11/17/15 0933    Clinical Impression Statement Pt with light concussion - went to ED yesterday but not admitted and cleared. Pt responding well to ice and initial HEP   Rehab Potential Good   OT Frequency 2x / week   OT Duration 6 weeks   OT Treatment/Interventions Self-care/ADL training;Moist Heat;Fluidtherapy;DME and/or AE instruction;Splinting;Patient/family education;Contrast Bath;Compression bandaging;Therapeutic exercises;Ultrasound;Therapeutic activities;Cryotherapy;Iontophoresis;Passive range of motion;Electrical Stimulation;Parrafin;Manual Therapy   Plan Assess response to iontophoresis and continue dose #2 if helping, review A/ROM ex's, ice, compensatory strategies/task modifications   Consulted and Agree with Plan of Care Patient      Patient will benefit from skilled therapeutic intervention in order to improve the following deficits and impairments:  Decreased coordination, Decreased range of motion, Impaired flexibility, Increased edema, Impaired sensation, Decreased knowledge of use of DME, Impaired UE functional use, Pain, Decreased strength  Visit Diagnosis: Pain in joints of left  hand  Stiffness of left hand, not elsewhere classified  Problem List Patient Active Problem List   Diagnosis Date Noted  . Pain of left thumb 10/07/2015  . 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 Strain of left thumb 02/11/2015  . Strain of right forearm 02/11/2015  . Right shoulder pain 12/31/2014  . Injury of right little finger 12/31/2014  . Episodic cluster headache, 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  . 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 Hypersomnia, recurrent 12/02/2014  . Migraine aura, persistent, intractable, with status migrainosus 12/02/2014  . Lower back injury 09/30/2014  . Bipolar I disorder, most recent episode depressed ([place])   . MDD (major depressive disorder), recurrent, severe, with psychosis ([place]) 09/23/2014  . Injury of left index finger 08/20/2014  . Right ankle sprain 06/01/2014  . Contusion, multiple sites 06/01/2014  . Strain of right gastrocnemius muscle 06/01/2014  . Phonophobia 05/04/2014  . Photophobia of both eyes 05/04/2014  . Emotionally unstable borderline personality disorder 05/04/2014  . Nausea with vomiting 05/04/2014  . Mixed bipolar I disorder ([place])   . Bipolar I disorder, most recent episode mixed ([place]) 04/18/2014  . Bipolar affective disorder, depressed, severe ([place]) 04/12/2014  . Suicidal ideation 04/12/2014  . Injury of right shoulder and upper arm 02/17/2014  . Left leg pain 06/03/2013  . Right hip pain 06/03/2013  . Migraine with status migrainosus 01/08/2013  . Personality disorder   . Chronic migraine 05/08/2012  . Contact dermatitis 11/27/2011  . Major depressive disorder, recurrent episode ([place]) 10/27/2011  . Generalized anxiety disorder 10/27/2011  . 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 Borderline personality disorder 10/27/2011  . Right foot pain 09/28/2011  . 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 Ganglion cyst 09/29/2009  . PES PLANUS 07/01/2008  . BIPOLAR DISORDER UNSPECIFIED 06/09/2008    Carey Bullocks, OTR/L 11/17/2015, 9:37 AM  [facility] [address]  [place], [place], [address] Phone: [phone]   Fax:  [phone]  Name: PENNIE SHAW MRN: [id number] Date of Birth: 1970/07/31

## 2015-11-17 NOTE — ED Notes (Signed)
Pt verbalized understanding of d/c instructions and has no further questions. Pt stable and NAD. VSS. Pt driving herself home and is ambulatory and cleared by the Dr to drive.

## 2015-11-22 ENCOUNTER — Ambulatory Visit: Payer: PPO | Admitting: Occupational Therapy

## 2015-11-22 DIAGNOSIS — M25542 Pain in joints of left hand: Secondary | ICD-10-CM

## 2015-11-22 DIAGNOSIS — R29898 Other symptoms and signs involving the musculoskeletal system: Secondary | ICD-10-CM

## 2015-11-22 NOTE — Patient Instructions (Signed)
  Recommendations:   Electric can opener  Jar opener or shelf liner to help open jars  Wring out washcloth over faucet, pulley style  Hold pots/pans more underneath placing more weight in forearm/elbow (vs. Wrist/thumb)   Look at and adapt the way you hold things to place more weight in bigger joints and take pressure off thumb and wrist. Remember, bigger joints can tolerate more!

## 2015-11-22 NOTE — Therapy (Signed)
[facility] [address]  [place], [place], [address] Phone: [phone]   Fax:  [phone]  Occupational Therapy Treatment  Patient Details  Name: [name] MRN: [id number] Date of Birth: 04-13-1970 Referring Provider: Dr. Karlton Lemon  Encounter Date: 11/22/2015      OT End of Session - 11/22/15 1326    Visit Number 3   Number of Visits 12   Date for OT Re-Evaluation 12/27/15   Authorization Type [employer] - G code needed   OT Start Time 1145  10 min. of ice - unbillable   OT Stop Time 1245   OT Time Calculation (min) 60 min   Equipment Utilized During Treatment iontophoresis   Activity Tolerance Patient tolerated treatment well      Past Medical History:  Diagnosis Date  . Achilles tendinitis   . Achilles tendinitis   . ADHD (attention deficit hyperactivity disorder)   . Allergy   . Arthritis   . Bipolar affective ([place])   . Bipolar disorder ([place])   . Eczema   . Ganglion cyst 09/29/2009   left wrist (2 cyst)  . Hyperprolactinemia ([place])   . Hypertension   . Lipoma   . Migraine   . Personality disorder   . Pes planus     Past Surgical History:  Procedure Laterality Date  . ANKLE SURGERY  12/88   left   . chest nodule  1990?   rt chest wall nodule removal  . GANGLION CYST EXCISION  2011  . lipoma removal    . right bunioectomy    . SHOULDER SURGERY  01/13/2011   right, partial tear  . tumor resection left thigh      There were no vitals filed for this visit.      Subjective Assessment - 11/22/15 1153    Subjective  The iontophoresis helped but it left a red spot that has not gone away   Pertinent History Rt shoulder surgery from [facility]   Patient Stated Goals to decr. pain and restore function   Currently in Pain? Yes   Pain Score 2    Pain Location --  thumb   Pain Orientation Left            [facility] OT Assessment - 11/22/15 0001      Observation/Other  Assessments   Observations Pt has 2 red spots (closed) and bumps around area of iontophoresis pad from last Wednesday. Pt denies any blistering but redness and bumps has continued since removing patch last Wednesday. Pt reports removing patch exactly at 4 hours. Pt wishes to try iontophoresis estim unit (vs. 4 hr patch) this time.                   OT Treatments/Exercises (OP) - 11/22/15 0001      ADLs   ADL Comments Discussion re: task modifications, compensatory strategies and A/E needs to prevent pain/reduce symptoms of tendonitis. Pt verbalized understanding. Recommended jar opener and electric can opener. Also discussed skin irritation and redness from last iontophoresis (possible allergic reaction): pt given the choice to wait a few days before doing again or trying ionto through estim device for only 20 min. in different location. Pt wished to try latter option     Cryotherapy   Number Minutes Cryotherapy 10 Minutes   Cryotherapy Location Hand;Wrist   Type of Cryotherapy Ice pack     Iontophoresis   Type of Iontophoresis Dexamethasone  Location radial wrist avoiding skin irriation from last dose   Dose 1.5 cc, Intensity 2.2 x 20 minutes for 40 mA (#2 dose)    Time 20 minutes  after 20 minutes, removed patch w/ NO skin blistering or adverse reactions                OT Education - 11/22/15 1230    Education provided Yes   Education Details task modifications/compensatory strategies, reaction to iontophoresis and options   Person(s) Educated Patient   Methods Explanation;Handout   Comprehension Verbalized understanding          OT Short Term Goals - 11/22/15 1330      OT SHORT TERM GOAL #1   Title Independent with initial A/ROM HEP (All STG's due 12/06/15)   Time 3   Period Weeks   Status On-going     OT SHORT TERM GOAL #2   Title Pain to be 4/10 or under with light functional tasks and A/ROM ex's   Baseline b/t 7-9/10   Time 3   Period Weeks    Status On-going     OT SHORT TERM GOAL #3   Title Pt to verbalize understanding with task modifications, compensatory strategies and A/E needs to reduce pain   Time 3   Period Weeks   Status On-going           OT Long Term Goals - 11/15/15 1003      OT LONG TERM GOAL #1   Title Independent with updated strengthening HEP (All LTG's due 12/27/15)    Time 6   Period Weeks   Status New     OT LONG TERM GOAL #2   Title Improve grip strength Lt hand by 10 lbs or greater to open jars/containers   Baseline eval = 20 lbs (Rt = 45 lbs)   Time 6   Period Weeks   Status New     OT LONG TERM GOAL #3   Title Lateral and tip pinch Lt thumb to improve by 3 lbs or greater for opening containers   Baseline eval = 3 lbs for both (Rt = 11.5 for both)    Time 6   Period Weeks   Status New     OT LONG TERM GOAL #4   Title Lt thumb MP flexion to increase to 50* or greater for functional tasks   Baseline eval = 40* (Rt = 55*)   Time 6   Period Weeks   Status New     OT LONG TERM GOAL #5   Title Improve coordination Lt hand as evidenced by decreasing speed on 9 hole peg test to 35 sec. or less   Baseline eval = 41.25 sec   Time 6   Period Weeks   Status New               Plan - 11/22/15 1327    Clinical Impression Statement Pt responding well to iontophoresis for pain relief but left pt with skin irritation including 2 red spots and bumps. Adjusted today with lower overall dosage and monitored in clinic. Pt approximating all STG's   Rehab Potential Good   OT Frequency 2x / week   OT Duration 6 weeks   OT Treatment/Interventions Self-care/ADL training;Moist Heat;Fluidtherapy;DME and/or AE instruction;Splinting;Patient/family education;Contrast Bath;Compression bandaging;Therapeutic exercises;Ultrasound;Therapeutic activities;Cryotherapy;Iontophoresis;Passive range of motion;Electrical Stimulation;Parrafin;Manual Therapy   Plan continue to monitor skin for irritation from  iontophoresis, if ok then continue dose #3.    Consulted and Agree with Plan  of Care Patient      Patient will benefit from skilled therapeutic intervention in order to improve the following deficits and impairments:  Decreased coordination, Decreased range of motion, Impaired flexibility, Increased edema, Impaired sensation, Decreased knowledge of use of DME, Impaired UE functional use, Pain, Decreased strength  Visit Diagnosis: Pain in joints of left hand  Other symptoms and signs involving the musculoskeletal system    Problem List Patient Active Problem List   Diagnosis Date Noted  . Pain of left thumb 10/07/2015  . 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 Strain of left thumb 02/11/2015  . Strain of right forearm 02/11/2015  . Right shoulder pain 12/31/2014  . Injury of right little finger 12/31/2014  . Episodic cluster headache, 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  . 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 Hypersomnia, recurrent 12/02/2014  . Migraine aura, persistent, intractable, with status migrainosus 12/02/2014  . Lower back injury 09/30/2014  . Bipolar I disorder, most recent episode depressed ([place])   . MDD (major depressive disorder), recurrent, severe, with psychosis ([place]) 09/23/2014  . Injury of left index finger 08/20/2014  . Right ankle sprain 06/01/2014  . Contusion, multiple sites 06/01/2014  . Strain of right gastrocnemius muscle 06/01/2014  . Phonophobia 05/04/2014  . Photophobia of both eyes 05/04/2014  . Emotionally unstable borderline personality disorder 05/04/2014  . Nausea with vomiting 05/04/2014  . Mixed bipolar I disorder ([place])   . Bipolar I disorder, most recent episode mixed ([place]) 04/18/2014  . Bipolar affective disorder, depressed, severe ([place]) 04/12/2014  . Suicidal ideation 04/12/2014  . Injury of right shoulder and upper arm 02/17/2014  . Left leg pain 06/03/2013  . Right hip  pain 06/03/2013  . Migraine with status migrainosus 01/08/2013  . Personality disorder   . Chronic migraine 05/08/2012  . Contact dermatitis 11/27/2011  . Major depressive disorder, recurrent episode ([place]) 10/27/2011  . Generalized anxiety disorder 10/27/2011  . 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 Borderline personality disorder 10/27/2011  . Right foot pain 09/28/2011  . 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 Ganglion cyst 09/29/2009  . PES PLANUS 07/01/2008  . BIPOLAR DISORDER UNSPECIFIED 06/09/2008    [name], [name] 11/22/2015, 1:31 PM  [facility] [address]  [place], [place], [address] Phone: [phone]   Fax:  [phone]  Name: [name] MRN: [id number] Date of Birth: Jan 13, 1971

## 2015-11-24 ENCOUNTER — Ambulatory Visit: Payer: PPO | Admitting: Occupational Therapy

## 2015-11-24 DIAGNOSIS — R29898 Other symptoms and signs involving the musculoskeletal system: Secondary | ICD-10-CM

## 2015-11-24 DIAGNOSIS — M25542 Pain in joints of left hand: Secondary | ICD-10-CM

## 2015-11-24 DIAGNOSIS — M25642 Stiffness of left hand, not elsewhere classified: Secondary | ICD-10-CM

## 2015-11-24 DIAGNOSIS — M6281 Muscle weakness (generalized): Secondary | ICD-10-CM

## 2015-11-24 NOTE — Therapy (Signed)
[facility] [address]  [place], [place], [address] Phone: [phone]   Fax:  [phone]  Occupational Therapy Treatment  Patient Details  Name: [name] MRN: [id number] Date of Birth: [birth year] Referring Provider: Dr. Karlton Lemon  Encounter Date: 11/24/2015      OT End of Session - 11/24/15 0900    Visit Number 4   Number of Visits 12   Date for OT Re-Evaluation 12/27/15   Authorization Type [employer] - G code needed   OT Start Time 0800  10 min. ice unbillable   OT Stop Time 0845   OT Time Calculation (min) 45 min   Activity Tolerance Patient tolerated treatment well      Past Medical History:  Diagnosis Date  . Achilles tendinitis   . Achilles tendinitis   . ADHD (attention deficit hyperactivity disorder)   . Allergy   . Arthritis   . Bipolar affective ([place])   . Bipolar disorder ([place])   . Eczema   . Ganglion cyst 09/29/2009   left wrist (2 cyst)  . Hyperprolactinemia ([place])   . Hypertension   . Lipoma   . Migraine   . Personality disorder   . Pes planus     Past Surgical History:  Procedure Laterality Date  . ANKLE SURGERY  12/88   left   . chest nodule  1990?   rt chest wall nodule removal  . GANGLION CYST EXCISION  2011  . lipoma removal    . right bunioectomy    . SHOULDER SURGERY  01/13/2011   right, partial tear  . tumor resection left thigh      There were no vitals filed for this visit.      Subjective Assessment - 11/24/15 0805    Pertinent History Rt shoulder surgery from [facility]   Patient Stated Goals to decr. pain and restore function   Currently in Pain? Yes  none during session today, only yesterday at work   Pain Score 5    Pain Location Wrist   Pain Orientation Left   Pain Descriptors / Indicators Aching   Pain Type Acute pain   Pain Onset More than a month ago   Pain Frequency Constant   Aggravating Factors  lifting, cutting food,  opening containers, work tasks   Pain Relieving Factors ice, pain meds, brace                      OT Treatments/Exercises (OP) - 11/24/15 0001      ADLs   ADL Comments Pt had skin reaction to iontophoresis dose #2 administered through estim device, but not as bad as 4 hr. patch. Will hold iontophoresis today and try ionto dose #3 next week if skin has cleared. Will d/c iontophoresis if pt continues to have reaction, however, it is helping with pain and this reaction was much milder than 4 hr. patch.      Fine Motor Coordination   Fine Motor Coordination Large Pegboard   Large Pegboard Pt placing large pegs in pegboard Lt hand then removing with soft CMC brace on d/t mild pain and resistance from pegboard.      Cryotherapy   Number Minutes Cryotherapy 10 Minutes   Cryotherapy Location Hand;Wrist   Type of Cryotherapy Ice pack     Splinting   Splinting Pt issued neoprene CMC splint to wear at home and eventually to wean from harder splint. Pt instructed to  still wear harder splint at work until pain is consistently under control, then gradually switch to softer splint. Pt also instructed in care of splint. Pt verbalized understanding                OT Education - 11/24/15 0848    Education provided Yes   Education Details Soft CMC splint wear and care   Person(s) Educated Patient   Methods Explanation;Demonstration   Comprehension Verbalized understanding          OT Short Term Goals - 11/22/15 1330      OT SHORT TERM GOAL #1   Title Independent with initial A/ROM HEP (All STG's due 12/06/15)   Time 3   Period Weeks   Status On-going     OT SHORT TERM GOAL #2   Title Pain to be 4/10 or under with light functional tasks and A/ROM ex's   Baseline b/t 7-9/10   Time 3   Period Weeks   Status On-going     OT SHORT TERM GOAL #3   Title Pt to verbalize understanding with task modifications, compensatory strategies and A/E needs to reduce pain   Time 3    Period Weeks   Status On-going           OT Long Term Goals - 11/15/15 1003      OT LONG TERM GOAL #1   Title Independent with updated strengthening HEP (All LTG's due 12/27/15)    Time 6   Period Weeks   Status New     OT LONG TERM GOAL #2   Title Improve grip strength Lt hand by 10 lbs or greater to open jars/containers   Baseline eval = 20 lbs (Rt = 45 lbs)   Time 6   Period Weeks   Status New     OT LONG TERM GOAL #3   Title Lateral and tip pinch Lt thumb to improve by 3 lbs or greater for opening containers   Baseline eval = 3 lbs for both (Rt = 11.5 for both)    Time 6   Period Weeks   Status New     OT LONG TERM GOAL #4   Title Lt thumb MP flexion to increase to 50* or greater for functional tasks   Baseline eval = 40* (Rt = 55*)   Time 6   Period Weeks   Status New     OT LONG TERM GOAL #5   Title Improve coordination Lt hand as evidenced by decreasing speed on 9 hole peg test to 35 sec. or less   Baseline eval = 41.25 sec   Time 6   Period Weeks   Status New               Plan - 11/24/15 0901    Clinical Impression Statement Pt with better response to iontophoresis #2 administerd through estim device but still had skin irritation/mild bumps over electrode site and therefore did not do 3rd dose today. Pt does report decr. pain overall   Rehab Potential Good   OT Frequency 2x / week   OT Duration 6 weeks   OT Treatment/Interventions Self-care/ADL training;Moist Heat;Fluidtherapy;DME and/or AE instruction;Splinting;Patient/family education;Contrast Bath;Compression bandaging;Therapeutic exercises;Ultrasound;Therapeutic activities;Cryotherapy;Iontophoresis;Passive range of motion;Electrical Stimulation;Parrafin;Manual Therapy   Plan If skin has cleared, try ionto dose #3. Progress towards goals   Consulted and Agree with Plan of Care Patient      Patient will benefit from skilled therapeutic intervention in order to improve the following  deficits and impairments:  Decreased coordination, Decreased range of motion, Impaired flexibility, Increased edema, Impaired sensation, Decreased knowledge of use of DME, Impaired UE functional use, Pain, Decreased strength  Visit Diagnosis: Pain in joints of left hand  Other symptoms and signs involving the musculoskeletal system  Stiffness of left hand, not elsewhere classified  Muscle weakness (generalized)    Problem List Patient Active Problem List   Diagnosis Date Noted  . Pain of left thumb 10/07/2015  . 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 Strain of left thumb 02/11/2015  . Strain of right forearm 02/11/2015  . Right shoulder pain 12/31/2014  . Injury of right little finger 12/31/2014  . Episodic cluster headache, 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  . 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 Hypersomnia, recurrent 12/02/2014  . Migraine aura, persistent, intractable, with status migrainosus 12/02/2014  . Lower back injury 09/30/2014  . Bipolar I disorder, most recent episode depressed ([place])   . MDD (major depressive disorder), recurrent, severe, with psychosis ([place]) 09/23/2014  . Injury of left index finger 08/20/2014  . Right ankle sprain 06/01/2014  . Contusion, multiple sites 06/01/2014  . Strain of right gastrocnemius muscle 06/01/2014  . Phonophobia 05/04/2014  . Photophobia of both eyes 05/04/2014  . Emotionally unstable borderline personality disorder 05/04/2014  . Nausea with vomiting 05/04/2014  . Mixed bipolar I disorder ([place])   . Bipolar I disorder, most recent episode mixed ([place]) 04/18/2014  . Bipolar affective disorder, depressed, severe ([place]) 04/12/2014  . Suicidal ideation 04/12/2014  . Injury of right shoulder and upper arm 02/17/2014  . Left leg pain 06/03/2013  . Right hip pain 06/03/2013  . Migraine with status migrainosus 01/08/2013  . Personality disorder   . Chronic migraine  05/08/2012  . Contact dermatitis 11/27/2011  . Major depressive disorder, recurrent episode ([place]) 10/27/2011  . Generalized anxiety disorder 10/27/2011  . 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 Borderline personality disorder 10/27/2011  . Right foot pain 09/28/2011  . 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 Ganglion cyst 09/29/2009  . PES PLANUS 07/01/2008  . BIPOLAR DISORDER UNSPECIFIED 06/09/2008    Carey Bullocks, OTR/L 11/24/2015, 9:24 AM  [facility] [address]  [place], [place], [address] Phone: [phone]   Fax:  [phone]  Name: [name] MRN: [id number] Date of Birth: [birth year]

## 2015-11-29 ENCOUNTER — Ambulatory Visit: Payer: PPO | Admitting: Occupational Therapy

## 2015-11-29 DIAGNOSIS — J029 Acute pharyngitis, unspecified: Secondary | ICD-10-CM | POA: Diagnosis not present

## 2015-11-29 DIAGNOSIS — J040 Acute laryngitis: Secondary | ICD-10-CM | POA: Diagnosis not present

## 2015-12-01 ENCOUNTER — Ambulatory Visit: Payer: PPO | Admitting: Occupational Therapy

## 2015-12-04 DIAGNOSIS — J069 Acute upper respiratory infection, unspecified: Secondary | ICD-10-CM | POA: Diagnosis not present

## 2015-12-06 ENCOUNTER — Ambulatory Visit: Payer: PPO | Admitting: Occupational Therapy

## 2015-12-08 ENCOUNTER — Ambulatory Visit: Payer: PPO | Attending: Family Medicine | Admitting: Occupational Therapy

## 2015-12-08 DIAGNOSIS — R29898 Other symptoms and signs involving the musculoskeletal system: Secondary | ICD-10-CM

## 2015-12-08 DIAGNOSIS — M25642 Stiffness of left hand, not elsewhere classified: Secondary | ICD-10-CM | POA: Insufficient documentation

## 2015-12-08 DIAGNOSIS — M6281 Muscle weakness (generalized): Secondary | ICD-10-CM | POA: Diagnosis not present

## 2015-12-08 DIAGNOSIS — M25542 Pain in joints of left hand: Secondary | ICD-10-CM | POA: Insufficient documentation

## 2015-12-08 NOTE — Therapy (Signed)
[facility] [address]  [place], [place], [address] Phone: [phone]   Fax:  [phone]  Occupational Therapy Treatment  Patient Details  Name: [name] MRN: [id number] Date of Birth: 02-13-1970 Referring Provider: Dr. Karlton Lemon  Encounter Date: 12/08/2015      OT End of Session - 12/08/15 0928    Visit Number 5   Number of Visits 12   Date for OT Re-Evaluation 12/27/15   Authorization Type [employer] - G code needed   OT Start Time 0855   OT Stop Time 0925   OT Time Calculation (min) 30 min   Equipment Utilized During Treatment iontophoresis   Activity Tolerance Patient tolerated treatment well      Past Medical History:  Diagnosis Date  . Achilles tendinitis   . Achilles tendinitis   . ADHD (attention deficit hyperactivity disorder)   . Allergy   . Arthritis   . Bipolar affective ([place])   . Bipolar disorder ([place])   . Eczema   . Ganglion cyst 09/29/2009   left wrist (2 cyst)  . Hyperprolactinemia ([place])   . Hypertension   . Lipoma   . Migraine   . Personality disorder   . Pes planus     Past Surgical History:  Procedure Laterality Date  . ANKLE SURGERY  12/88   left   . chest nodule  1990?   rt chest wall nodule removal  . GANGLION CYST EXCISION  2011  . lipoma removal    . right bunioectomy    . SHOULDER SURGERY  01/13/2011   right, partial tear  . tumor resection left thigh      There were no vitals filed for this visit.      Subjective Assessment - 12/08/15 0857    Subjective  I like the new brace you gave me   Pertinent History Rt shoulder surgery from [facility]   Patient Stated Goals to decr. pain and restore function   Currently in Pain? Yes   Pain Score 3    Pain Location --  THUMB   Pain Orientation Left   Pain Descriptors / Indicators Aching   Pain Type Acute pain   Pain Onset More than a month ago   Pain Frequency Constant   Aggravating Factors   lifting, cutting food, opening containers, work tasks   Pain Relieving Factors ice, pain meds, brace                      OT Treatments/Exercises (OP) - 12/08/15 0001      ADLs   ADL Comments Discussed/reviewed STG's. Reviewed task modifications and compensatory strategies and possible A/E needs. Pt issued red foam to go over eating utensils prn (especially for knife)      Iontophoresis   Type of Iontophoresis Dexamethasone   Location base of thumb radially   Dose 1.2 mL, 80 mA (dose #3)    Time 4 hr patch - 10 minutes to set up  estim device for iontophoresis unavailable today                  OT Short Term Goals - 12/08/15 0928      OT SHORT TERM GOAL #1   Title Independent with initial A/ROM HEP (All STG's due 12/06/15)   Time 3   Period Weeks   Status Achieved     OT SHORT TERM GOAL #2   Title Pain to be  4/10 or under with light functional tasks and A/ROM ex's   Baseline b/t 7-9/10   Time 3   Period Weeks   Status On-going     OT SHORT TERM GOAL #3   Title Pt to verbalize understanding with task modifications, compensatory strategies and A/E needs to reduce pain   Time 3   Period Weeks   Status Achieved           OT Long Term Goals - 11/15/15 1003      OT LONG TERM GOAL #1   Title Independent with updated strengthening HEP (All LTG's due 12/27/15)    Time 6   Period Weeks   Status New     OT LONG TERM GOAL #2   Title Improve grip strength Lt hand by 10 lbs or greater to open jars/containers   Baseline eval = 20 lbs (Rt = 45 lbs)   Time 6   Period Weeks   Status New     OT LONG TERM GOAL #3   Title Lateral and tip pinch Lt thumb to improve by 3 lbs or greater for opening containers   Baseline eval = 3 lbs for both (Rt = 11.5 for both)    Time 6   Period Weeks   Status New     OT LONG TERM GOAL #4   Title Lt thumb MP flexion to increase to 50* or greater for functional tasks   Baseline eval = 40* (Rt = 55*)   Time 6    Period Weeks   Status New     OT LONG TERM GOAL #5   Title Improve coordination Lt hand as evidenced by decreasing speed on 9 hole peg test to 35 sec. or less   Baseline eval = 41.25 sec   Time 6   Period Weeks   Status New               Plan - 12/08/15 0929    Clinical Impression Statement Pt met STG's #1 and #3. STG #2 not consistent yet. Completed iontophoresis dose #3 today (however, used 4 hr. patch vs. estim device delivery secondary to device unavailable today)   Rehab Potential Good   OT Frequency 2x / week   OT Duration 6 weeks   OT Treatment/Interventions Self-care/ADL training;Moist Heat;Fluidtherapy;DME and/or AE instruction;Splinting;Patient/family education;Contrast Bath;Compression bandaging;Therapeutic exercises;Ultrasound;Therapeutic activities;Cryotherapy;Iontophoresis;Passive range of motion;Electrical Stimulation;Parrafin;Manual Therapy   Plan continue ionto dose #4 if no significant skin reactions. Progress towards gentle strengthening   Recommended Other Services iontophoresis   Consulted and Agree with Plan of Care Patient      Patient will benefit from skilled therapeutic intervention in order to improve the following deficits and impairments:  Decreased coordination, Decreased range of motion, Impaired flexibility, Increased edema, Impaired sensation, Decreased knowledge of use of DME, Impaired UE functional use, Pain, Decreased strength  Visit Diagnosis: Pain in joints of left hand  Other symptoms and signs involving the musculoskeletal system    Problem List Patient Active Problem List   Diagnosis Date Noted  . Pain of left thumb 10/07/2015  . 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 Strain of left thumb 02/11/2015  . Strain of right forearm 02/11/2015  . Right shoulder pain 12/31/2014  . Injury of right little finger 12/31/2014  . Episodic cluster headache, 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  . Chronic paroxysmal hemicrania, not intractable  12/02/2014  . 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 Hypersomnia, recurrent 12/02/2014  . Migraine aura, persistent, intractable, with status  migrainosus 12/02/2014  . Lower back injury 09/30/2014  . Bipolar I disorder, most recent episode depressed ([place])   . MDD (major depressive disorder), recurrent, severe, with psychosis ([place]) 09/23/2014  . Injury of left index finger 08/20/2014  . Right ankle sprain 06/01/2014  . Contusion, multiple sites 06/01/2014  . Strain of right gastrocnemius muscle 06/01/2014  . Phonophobia 05/04/2014  . Photophobia of both eyes 05/04/2014  . Emotionally unstable borderline personality disorder 05/04/2014  . Nausea with vomiting 05/04/2014  . Mixed bipolar I disorder ([place])   . Bipolar I disorder, most recent episode mixed ([place]) 04/18/2014  . Bipolar affective disorder, depressed, severe ([place]) 04/12/2014  . Suicidal ideation 04/12/2014  . Injury of right shoulder and upper arm 02/17/2014  . Left leg pain 06/03/2013  . Right hip pain 06/03/2013  . Migraine with status migrainosus 01/08/2013  . Personality disorder   . Chronic migraine 05/08/2012  . Contact dermatitis 11/27/2011  . Major depressive disorder, recurrent episode ([place]) 10/27/2011  . Generalized anxiety disorder 10/27/2011  . 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 Borderline personality disorder 10/27/2011  . Right foot pain 09/28/2011  . 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 Ganglion cyst 09/29/2009  . PES PLANUS 07/01/2008  . BIPOLAR DISORDER UNSPECIFIED 06/09/2008    Carey Bullocks, OTR/L 12/08/2015, 9:32 AM  [facility] [address]  [place], [place], [address] Phone: [phone]   Fax:  [phone]  Name: [name] MRN: [id number] Date of Birth: 06/08/1970

## 2015-12-10 ENCOUNTER — Other Ambulatory Visit: Payer: Self-pay | Admitting: Neurology

## 2015-12-10 DIAGNOSIS — G43011 Migraine without aura, intractable, with status migrainosus: Secondary | ICD-10-CM

## 2015-12-13 ENCOUNTER — Ambulatory Visit: Payer: PPO | Admitting: Occupational Therapy

## 2015-12-13 DIAGNOSIS — M25542 Pain in joints of left hand: Secondary | ICD-10-CM | POA: Diagnosis not present

## 2015-12-13 DIAGNOSIS — M6281 Muscle weakness (generalized): Secondary | ICD-10-CM

## 2015-12-13 DIAGNOSIS — M25642 Stiffness of left hand, not elsewhere classified: Secondary | ICD-10-CM

## 2015-12-13 NOTE — Patient Instructions (Signed)
1. Grip Strengthening (Resistive Putty)   Squeeze putty using thumb and all fingers. Repeat _15___ times. Do __2__ sessions per day.   2. Lateral Pinch Strengthening (Resistive Putty)    Squeeze between thumb and side of each finger in turn. Repeat _10___ times. Do __2__ sessions per day.    3. Roll putty into tube on table and pinch between each finger and thumb x 10 reps each. (Do ring and small finger together)

## 2015-12-13 NOTE — Therapy (Signed)
[facility] [address]  [place], [place], [address] Phone: [phone]   Fax:  [phone]  Occupational Therapy Treatment  Patient Details  Name: [name] MRN: [id number] Date of Birth: Nov 27, 1970 Referring Provider: Dr. Karlton Lemon  Encounter Date: 12/13/2015      OT End of Session - 12/13/15 1039    Visit Number 6   Number of Visits 12   Date for OT Re-Evaluation 12/27/15   Authorization Type [employer] - G code needed   Authorization - Visit Number 6   Authorization - Number of Visits 10   OT Start Time 0845   OT Stop Time 0935   OT Time Calculation (min) 50 min   Equipment Utilized During Treatment iontophoresis   Activity Tolerance Patient tolerated treatment well      Past Medical History:  Diagnosis Date  . Achilles tendinitis   . Achilles tendinitis   . ADHD (attention deficit hyperactivity disorder)   . Allergy   . Arthritis   . Bipolar affective ([place])   . Bipolar disorder ([place])   . Eczema   . Ganglion cyst 09/29/2009   left wrist (2 cyst)  . Hyperprolactinemia ([place])   . Hypertension   . Lipoma   . Migraine   . Personality disorder   . Pes planus     Past Surgical History:  Procedure Laterality Date  . ANKLE SURGERY  12/88   left   . chest nodule  1990?   rt chest wall nodule removal  . GANGLION CYST EXCISION  2011  . lipoma removal    . right bunioectomy    . SHOULDER SURGERY  01/13/2011   right, partial tear  . tumor resection left thigh      There were no vitals filed for this visit.      Subjective Assessment - 12/13/15 0851    Subjective  I've been wearing the hard brace only at work. I wear the soft neoprene or nothing at home   Pertinent History Rt shoulder surgery from [facility]   Patient Stated Goals to decr. pain and restore function   Currently in Pain? Yes   Pain Score 2    Pain Location --  THUMB   Pain Orientation Left   Pain Descriptors  / Indicators Aching   Pain Type Acute pain   Pain Onset More than a month ago   Pain Frequency Constant   Aggravating Factors  lifting, cutting food, opening containers, work tasks   Pain Relieving Factors ice, pain meds, brace            [facility] OT Assessment - 12/13/15 0001      Hand Function   Right Hand Grip (lbs) 58 lbs   Right Hand Lateral Pinch 13.5 lbs   Left Hand Grip (lbs) 60 lbs   Left Hand Lateral Pinch 10 lbs                  OT Treatments/Exercises (OP) - 12/13/15 0001      Hand Exercises   Other Hand Exercises --   Other Hand Exercises Putty HEP issued (see pt instructions). Pt demo each x 10 reps and issued red putty      Iontophoresis   Type of Iontophoresis Dexamethasone   Location base of thumb radially   Dose 1.5 cc, 2.2 mA (dose #4) via estim device   Time x 23 minutes (18 min. run time, 5 min set up)  OT Education - 12/13/15 [id number]    Education provided Yes   Education Details Putty HEP    Person(s) Educated Patient   Methods Explanation;Demonstration;Handout   Comprehension Verbalized understanding;Returned demonstration          OT Short Term Goals - 12/08/15 0928      OT SHORT TERM GOAL #1   Title Independent with initial A/ROM HEP (All STG's due 12/06/15)   Time 3   Period Weeks   Status Achieved     OT SHORT TERM GOAL #2   Title Pain to be 4/10 or under with light functional tasks and A/ROM ex's   Baseline b/t 7-9/10   Time 3   Period Weeks   Status On-going     OT SHORT TERM GOAL #3   Title Pt to verbalize understanding with task modifications, compensatory strategies and A/E needs to reduce pain   Time 3   Period Weeks   Status Achieved           OT Long Term Goals - 12/13/15 1041      OT LONG TERM GOAL #1   Title Independent with updated strengthening HEP (All LTG's due 12/27/15)    Time 6   Period Weeks   Status Achieved     OT LONG TERM GOAL #2   Title Improve grip strength Lt  hand by 10 lbs or greater to open jars/containers   Baseline eval = 20 lbs (Rt = 45 lbs)   Time 6   Period Weeks   Status Achieved  Lt grip = 60 lbs     OT LONG TERM GOAL #3   Title Lateral and tip pinch Lt thumb to improve by 3 lbs or greater for opening containers   Baseline eval = 3 lbs for both (Rt = 11.5 for both)    Time 6   Period Weeks   Status On-going     OT LONG TERM GOAL #4   Title Lt thumb MP flexion to increase to 50* or greater for functional tasks   Baseline eval = 40* (Rt = 55*)   Time 6   Period Weeks   Status On-going     OT LONG TERM GOAL #5   Title Improve coordination Lt hand as evidenced by decreasing speed on 9 hole peg test to 35 sec. or less   Baseline eval = 41.25 sec   Time 6   Period Weeks   Status New               Plan - 12/13/15 1042    Clinical Impression Statement Pt met LTG's #1 and #2. Pt approximating other LTG's. Pt with overall less pain and increased grip strength.    OT Frequency 2x / week   OT Duration 6 weeks   OT Treatment/Interventions Self-care/ADL training;Moist Heat;Fluidtherapy;DME and/or AE instruction;Splinting;Patient/family education;Contrast Bath;Compression bandaging;Therapeutic exercises;Ultrasound;Therapeutic activities;Cryotherapy;Iontophoresis;Passive range of motion;Electrical Stimulation;Parrafin;Manual Therapy   Plan continue dose #5 if skin clear, begin assessing remaining LTG's, continue strengthening as tolerated   Consulted and Agree with Plan of Care Patient      Patient will benefit from skilled therapeutic intervention in order to improve the following deficits and impairments:  Decreased coordination, Decreased range of motion, Impaired flexibility, Increased edema, Impaired sensation, Decreased knowledge of use of DME, Impaired UE functional use, Pain, Decreased strength  Visit Diagnosis: Pain in joints of left hand  Stiffness of left hand, not elsewhere classified  Muscle weakness  (generalized)    Problem List  Patient Active Problem List   Diagnosis Date Noted  . Pain of left thumb 10/07/2015  . 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 Strain of left thumb 02/11/2015  . Strain of right forearm 02/11/2015  . Right shoulder pain 12/31/2014  . Injury of right little finger 12/31/2014  . Episodic cluster headache, 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  . 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 Hypersomnia, recurrent 12/02/2014  . Migraine aura, persistent, intractable, with status migrainosus 12/02/2014  . Lower back injury 09/30/2014  . Bipolar I disorder, most recent episode depressed ([place])   . MDD (major depressive disorder), recurrent, severe, with psychosis ([place]) 09/23/2014  . Injury of left index finger 08/20/2014  . Right ankle sprain 06/01/2014  . Contusion, multiple sites 06/01/2014  . Strain of right gastrocnemius muscle 06/01/2014  . Phonophobia 05/04/2014  . Photophobia of both eyes 05/04/2014  . Emotionally unstable borderline personality disorder 05/04/2014  . Nausea with vomiting 05/04/2014  . Mixed bipolar I disorder ([place])   . Bipolar I disorder, most recent episode mixed ([place]) 04/18/2014  . Bipolar affective disorder, depressed, severe ([place]) 04/12/2014  . Suicidal ideation 04/12/2014  . Injury of right shoulder and upper arm 02/17/2014  . Left leg pain 06/03/2013  . Right hip pain 06/03/2013  . Migraine with status migrainosus 01/08/2013  . Personality disorder   . Chronic migraine 05/08/2012  . Contact dermatitis 11/27/2011  . Major depressive disorder, recurrent episode ([place]) 10/27/2011  . Generalized anxiety disorder 10/27/2011  . 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 Borderline personality disorder 10/27/2011  . Right foot pain 09/28/2011  . 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  Ganglion cyst 09/29/2009  . PES PLANUS 07/01/2008  . BIPOLAR DISORDER UNSPECIFIED 06/09/2008    Carey Bullocks, OTR/L 12/13/2015, 10:46 AM  [facility] [address]  [place], [place], [address] Phone: [phone]   Fax:  [phone]  Name: [name] MRN: [id number] Date of Birth: 04-28-1970

## 2015-12-15 ENCOUNTER — Ambulatory Visit: Payer: PPO | Admitting: Occupational Therapy

## 2015-12-15 DIAGNOSIS — M25542 Pain in joints of left hand: Secondary | ICD-10-CM

## 2015-12-15 DIAGNOSIS — M25642 Stiffness of left hand, not elsewhere classified: Secondary | ICD-10-CM

## 2015-12-15 DIAGNOSIS — M6281 Muscle weakness (generalized): Secondary | ICD-10-CM

## 2015-12-15 NOTE — Therapy (Signed)
[facility] [address]  [place], [place], [address] Phone: [phone]   Fax:  [phone]  Occupational Therapy Treatment  Patient Details  Name: [name] MRN: [id number] Date of Birth: 07/08/1970 Referring Provider: Dr. Karlton Lemon  Encounter Date: 12/15/2015      OT End of Session - 12/15/15 0926    Visit Number 7   Number of Visits 12   Date for OT Re-Evaluation 12/27/15   Authorization Type [employer] - G code needed   Authorization - Visit Number 7   Authorization - Number of Visits 10   OT Start Time 0845   OT Stop Time [phone]   OT Time Calculation (min) 53 min   Equipment Utilized During Treatment iontophoresis   Activity Tolerance Patient tolerated treatment well      Past Medical History:  Diagnosis Date  . Achilles tendinitis   . Achilles tendinitis   . ADHD (attention deficit hyperactivity disorder)   . Allergy   . Arthritis   . Bipolar affective ([place])   . Bipolar disorder ([place])   . Eczema   . Ganglion cyst 09/29/2009   left wrist (2 cyst)  . Hyperprolactinemia ([place])   . Hypertension   . Lipoma   . Migraine   . Personality disorder   . Pes planus     Past Surgical History:  Procedure Laterality Date  . ANKLE SURGERY  12/88   left   . chest nodule  1990?   rt chest wall nodule removal  . GANGLION CYST EXCISION  2011  . lipoma removal    . right bunioectomy    . SHOULDER SURGERY  01/13/2011   right, partial tear  . tumor resection left thigh      There were no vitals filed for this visit.      Subjective Assessment - 12/15/15 0850    Subjective  I'm adapting the way I do things at work which is helping   Pertinent History Rt shoulder surgery from [facility]   Patient Stated Goals to decr. pain and restore function   Currently in Pain? Yes   Pain Score 2   up to 6/10 at work    Pain Location --  THUMB   Pain Orientation Left   Pain Descriptors /  Indicators Aching   Pain Type Acute pain   Pain Onset More than a month ago   Pain Frequency Constant   Aggravating Factors  work tasks   Pain Relieving Factors ice, pain meds, ionto, brace            [facility] OT Assessment - 12/15/15 0001      Coordination   Left 9 Hole Peg Test 29.66 sec     Left Hand AROM   L Thumb MCP 0-60 50 Degrees                  OT Treatments/Exercises (OP) - 12/15/15 0001      ADLs   ADL Comments Assessed remaining STG and began assessing remaining LTG's     Wrist Exercises   Other wrist exercises Wrist flex, ext, RD x 15 reps each with 2 lb weight     Hand Exercises   Other Hand Exercises Gripper set at 35 lbs resistance to pick up blocks Lt hand for sustained grip strength with mod difficulty/drops and 1 rest break    Other Hand Exercises Clothespin activity for lateral pinch strength: yellow to green resistance  Iontophoresis   Type of Iontophoresis Dexamethasone   Location base of thumb radially  moved slightly more radially today to avoid skin irritation - pt had mild bumps/redness at site from last session   Dose 1.5 cc, 2.2 mA (dose #5) via estim device   Time x 23 minutes (18 min. run time, 5 min set up)                   OT Short Term Goals - 12/15/15 0910      OT SHORT TERM GOAL #1   Title Independent with initial A/ROM HEP (All STG's due 12/06/15)   Time 3   Period Weeks   Status Achieved     OT SHORT TERM GOAL #2   Title Pain to be 4/10 or under with light functional tasks and A/ROM ex's   Baseline b/t 7-9/10   Time 3   Period Weeks   Status Achieved  met except work tasks (up to 6/10 at work)      OT [place] #3   Title Pt to verbalize understanding with task modifications, compensatory strategies and A/E needs to reduce pain   Time 3   Period Weeks   Status Achieved           OT Long Term Goals - 12/15/15 0911      OT LONG TERM GOAL #1   Title Independent with updated  strengthening HEP (All LTG's due 12/27/15)    Time 6   Period Weeks   Status Achieved     OT LONG TERM GOAL #2   Title Improve grip strength Lt hand by 10 lbs or greater to open jars/containers   Baseline eval = 20 lbs (Rt = 45 lbs)   Time 6   Period Weeks   Status Achieved  Lt grip = 60 lbs     OT LONG TERM GOAL #3   Title Lateral and tip pinch Lt thumb to improve by 3 lbs or greater for opening containers   Baseline eval = 3 lbs for both (Rt = 11.5 for both)    Time 6   Period Weeks   Status On-going     OT LONG TERM GOAL #4   Title Lt thumb MP flexion to increase to 50* or greater for functional tasks   Baseline eval = 40* (Rt = 55*)   Time 6   Period Weeks   Status Achieved  12/15/15: 50*     OT LONG TERM GOAL #5   Title Improve coordination Lt hand as evidenced by decreasing speed on 9 hole peg test to 35 sec. or less   Baseline eval = 41.25 sec   Time 6   Period Weeks   Status Achieved  12/15/15: 29.66 sec.                Plan - 12/15/15 0913    Clinical Impression Statement Pt met STG #2 and LTG's #4 and #5. Pt with improved overall pain, ROM, and coordination.    Rehab Potential Good   OT Frequency 2x / week   OT Duration 6 weeks   OT Treatment/Interventions Self-care/ADL training;Moist Heat;Fluidtherapy;DME and/or AE instruction;Splinting;Patient/family education;Contrast Bath;Compression bandaging;Therapeutic exercises;Ultrasound;Therapeutic activities;Cryotherapy;Iontophoresis;Passive range of motion;Electrical Stimulation;Parrafin;Manual Therapy   Plan continue dose #6 iontophoresis, continue strengthening and coordination as tolerated   Consulted and Agree with Plan of Care Patient      Patient will benefit from skilled therapeutic intervention in order to improve the following deficits and  impairments:  Decreased coordination, Decreased range of motion, Impaired flexibility, Increased edema, Impaired sensation, Decreased knowledge of use of DME,  Impaired UE functional use, Pain, Decreased strength  Visit Diagnosis: Muscle weakness (generalized)  Pain in joints of left hand  Stiffness of left hand, not elsewhere classified    Problem List Patient Active Problem List   Diagnosis Date Noted  . Pain of left thumb 10/07/2015  . 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 Strain of left thumb 02/11/2015  . Strain of right forearm 02/11/2015  . Right shoulder pain 12/31/2014  . Injury of right little finger 12/31/2014  . Episodic cluster headache, 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  . 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 Hypersomnia, recurrent 12/02/2014  . Migraine aura, persistent, intractable, with status migrainosus 12/02/2014  . Lower back injury 09/30/2014  . Bipolar I disorder, most recent episode depressed ([place])   . MDD (major depressive disorder), recurrent, severe, with psychosis ([place]) 09/23/2014  . Injury of left index finger 08/20/2014  . Right ankle sprain 06/01/2014  . Contusion, multiple sites 06/01/2014  . Strain of right gastrocnemius muscle 06/01/2014  . Phonophobia 05/04/2014  . Photophobia of both eyes 05/04/2014  . Emotionally unstable borderline personality disorder 05/04/2014  . Nausea with vomiting 05/04/2014  . Mixed bipolar I disorder ([place])   . Bipolar I disorder, most recent episode mixed ([place]) 04/18/2014  . Bipolar affective disorder, depressed, severe ([place]) 04/12/2014  . Suicidal ideation 04/12/2014  . Injury of right shoulder and upper arm 02/17/2014  . Left leg pain 06/03/2013  . Right hip pain 06/03/2013  . Migraine with status migrainosus 01/08/2013  . Personality disorder   . Chronic migraine 05/08/2012  . Contact dermatitis 11/27/2011  . Major depressive disorder, recurrent episode ([place]) 10/27/2011  . Generalized anxiety disorder 10/27/2011  . 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  Borderline personality disorder 10/27/2011  . Right foot pain 09/28/2011  . 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 Ganglion cyst 09/29/2009  . PES PLANUS 07/01/2008  . BIPOLAR DISORDER UNSPECIFIED 06/09/2008    Carey Bullocks, OTR/L 12/15/2015, 9:28 AM  [facility] [address]  [place], [place], [address] Phone: [phone]   Fax:  [phone]  Name: [name] MRN: [id number] Date of Birth: Oct 19, 1970

## 2015-12-20 ENCOUNTER — Ambulatory Visit: Payer: PPO | Admitting: Occupational Therapy

## 2015-12-20 DIAGNOSIS — M6281 Muscle weakness (generalized): Secondary | ICD-10-CM

## 2015-12-20 DIAGNOSIS — M25542 Pain in joints of left hand: Secondary | ICD-10-CM | POA: Diagnosis not present

## 2015-12-20 DIAGNOSIS — M25642 Stiffness of left hand, not elsewhere classified: Secondary | ICD-10-CM

## 2015-12-20 NOTE — Therapy (Signed)
[facility] [address]  [place], [place], [address] Phone: [phone]   Fax:  [phone]  Occupational Therapy Treatment  Patient Details  Name: [name] MRN: [id number] Date of Birth: Dec 19, 1970 Referring Provider: Dr. Karlton Lemon  Encounter Date: 12/20/2015      OT End of Session - 12/20/15 0924    Visit Number 8   Number of Visits 12   Date for OT Re-Evaluation 12/27/15   Authorization Type [employer] - G code needed   Authorization - Visit Number 8   Authorization - Number of Visits 10   OT Start Time 0850   OT Stop Time 0942   OT Time Calculation (min) 52 min   Equipment Utilized During Treatment iontophoresis   Activity Tolerance Patient tolerated treatment well      Past Medical History:  Diagnosis Date  . Achilles tendinitis   . Achilles tendinitis   . ADHD (attention deficit hyperactivity disorder)   . Allergy   . Arthritis   . Bipolar affective ([place])   . Bipolar disorder ([place])   . Eczema   . Ganglion cyst 09/29/2009   left wrist (2 cyst)  . Hyperprolactinemia ([place])   . Hypertension   . Lipoma   . Migraine   . Personality disorder   . Pes planus     Past Surgical History:  Procedure Laterality Date  . ANKLE SURGERY  12/88   left   . chest nodule  1990?   rt chest wall nodule removal  . GANGLION CYST EXCISION  2011  . lipoma removal    . right bunioectomy    . SHOULDER SURGERY  01/13/2011   right, partial tear  . tumor resection left thigh      There were no vitals filed for this visit.      Subjective Assessment - 12/20/15 0856    Pertinent History Rt shoulder surgery from RTC   Patient Stated Goals to decr. pain and restore function   Currently in Pain? Yes   Pain Score 1    Pain Location --  THUMB   Pain Orientation Left   Pain Type Acute pain   Pain Onset More than a month ago   Pain Frequency Intermittent   Aggravating Factors  work tasks    Pain Relieving Factors ice, pain meds, ionto, brace                      OT Treatments/Exercises (OP) - 12/20/15 0001      Wrist Exercises   Other wrist exercises Wrist flex, ext, RD x 15 reps each with 2 lb weight   Other wrist exercises Forearm gym x 2 full revolutions for wrist/forearm motion. Followed by resisted sup/pron. with hammer keeping wrist neutral for isometric wrist strengthening with motion at forearm     Hand Exercises   Other Hand Exercises Gripper set at 35 lbs resistance to pick up blocks Lt hand for sustained grip strength with mod difficulty/drops and 1 rest break      Iontophoresis   Type of Iontophoresis Dexamethasone   Location base of thumb radially   Dose 1.5 cc, 1.8 mA (dose #6) via estim device (Pt could not tolerate 2.2 mA today, had to decr. to 1.8 mA, therefore took longer to run)    Time x 29 minutes (24 min. run time, 5 min set up)  OT Short Term Goals - 12/15/15 0910      OT SHORT TERM GOAL #1   Title Independent with initial A/ROM HEP (All STG's due 12/06/15)   Time 3   Period Weeks   Status Achieved     OT SHORT TERM GOAL #2   Title Pain to be 4/10 or under with light functional tasks and A/ROM ex's   Baseline b/t 7-9/10   Time 3   Period Weeks   Status Achieved  met except work tasks (up to 6/10 at work)      OT [place] #3   Title Pt to verbalize understanding with task modifications, compensatory strategies and A/E needs to reduce pain   Time 3   Period Weeks   Status Achieved           OT Long Term Goals - 12/15/15 0911      OT LONG TERM GOAL #1   Title Independent with updated strengthening HEP (All LTG's due 12/27/15)    Time 6   Period Weeks   Status Achieved     OT LONG TERM GOAL #2   Title Improve grip strength Lt hand by 10 lbs or greater to open jars/containers   Baseline eval = 20 lbs (Rt = 45 lbs)   Time 6   Period Weeks   Status Achieved  Lt grip = 60 lbs      OT LONG TERM GOAL #3   Title Lateral and tip pinch Lt thumb to improve by 3 lbs or greater for opening containers   Baseline eval = 3 lbs for both (Rt = 11.5 for both)    Time 6   Period Weeks   Status On-going     OT LONG TERM GOAL #4   Title Lt thumb MP flexion to increase to 50* or greater for functional tasks   Baseline eval = 40* (Rt = 55*)   Time 6   Period Weeks   Status Achieved  12/15/15: 50*     OT LONG TERM GOAL #5   Title Improve coordination Lt hand as evidenced by decreasing speed on 9 hole peg test to 35 sec. or less   Baseline eval = 41.25 sec   Time 6   Period Weeks   Status Achieved  12/15/15: 29.66 sec.                Plan - 12/20/15 0925    Clinical Impression Statement Pt received last iontophoresis treatment today. Pt responding well with decr. overall pain   OT Frequency 2x / week   OT Duration 6 weeks   OT Treatment/Interventions Self-care/ADL training;Moist Heat;Fluidtherapy;DME and/or AE instruction;Splinting;Patient/family education;Contrast Bath;Compression bandaging;Therapeutic exercises;Ultrasound;Therapeutic activities;Cryotherapy;Iontophoresis;Passive range of motion;Electrical Stimulation;Parrafin;Manual Therapy   Plan continue strengthening, assess remaining LTG's and anticipate d/c next week   Consulted and Agree with Plan of Care Patient      Patient will benefit from skilled therapeutic intervention in order to improve the following deficits and impairments:  Decreased coordination, Decreased range of motion, Impaired flexibility, Increased edema, Impaired sensation, Decreased knowledge of use of DME, Impaired UE functional use, Pain, Decreased strength  Visit Diagnosis: Muscle weakness (generalized)  Pain in joints of left hand  Stiffness of left hand, not elsewhere classified    Problem List Patient Active Problem List   Diagnosis Date Noted  . Pain of left thumb 10/07/2015  . Insomnia due to mental disorder  02/17/2015  . Strain of left thumb 02/11/2015  . Strain  of right forearm 02/11/2015  . Right shoulder pain 12/31/2014  . Injury of right little finger 12/31/2014  . Episodic cluster headache, 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  . 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 Hypersomnia, recurrent 12/02/2014  . Migraine aura, persistent, intractable, with status migrainosus 12/02/2014  . Lower back injury 09/30/2014  . Bipolar I disorder, most recent episode depressed ([place])   . MDD (major depressive disorder), recurrent, severe, with psychosis ([place]) 09/23/2014  . Injury of left index finger 08/20/2014  . Right ankle sprain 06/01/2014  . Contusion, multiple sites 06/01/2014  . Strain of right gastrocnemius muscle 06/01/2014  . Phonophobia 05/04/2014  . Photophobia of both eyes 05/04/2014  . Emotionally unstable borderline personality disorder 05/04/2014  . Nausea with vomiting 05/04/2014  . Mixed bipolar I disorder ([place])   . Bipolar I disorder, most recent episode mixed ([place]) 04/18/2014  . Bipolar affective disorder, depressed, severe ([place]) 04/12/2014  . Suicidal ideation 04/12/2014  . Injury of right shoulder and upper arm 02/17/2014  . Left leg pain 06/03/2013  . Right hip pain 06/03/2013  . Migraine with status migrainosus 01/08/2013  . Personality disorder   . Chronic migraine 05/08/2012  . Contact dermatitis 11/27/2011  . Major depressive disorder, recurrent episode ([place]) 10/27/2011  . Generalized anxiety disorder 10/27/2011  . 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 Borderline personality disorder 10/27/2011  . Right foot pain 09/28/2011  . 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 Ganglion cyst 09/29/2009  . PES PLANUS 07/01/2008  . BIPOLAR DISORDER UNSPECIFIED 06/09/2008    Carey Bullocks, OTR/L 12/20/2015, 9:37  AM  [facility] [address]  [place], [place], [address] Phone: [phone]   Fax:  [phone]  Name: [name] MRN: [id number] Date of Birth: [birth year]

## 2015-12-21 DIAGNOSIS — J029 Acute pharyngitis, unspecified: Secondary | ICD-10-CM | POA: Diagnosis not present

## 2015-12-27 ENCOUNTER — Ambulatory Visit: Payer: PPO | Admitting: Occupational Therapy

## 2015-12-27 DIAGNOSIS — M25642 Stiffness of left hand, not elsewhere classified: Secondary | ICD-10-CM

## 2015-12-27 DIAGNOSIS — M6281 Muscle weakness (generalized): Secondary | ICD-10-CM

## 2015-12-27 DIAGNOSIS — M25542 Pain in joints of left hand: Secondary | ICD-10-CM | POA: Diagnosis not present

## 2015-12-27 DIAGNOSIS — R29898 Other symptoms and signs involving the musculoskeletal system: Secondary | ICD-10-CM

## 2015-12-27 NOTE — Therapy (Signed)
[facility] [address]  [place], [place], [address] Phone: [phone]   Fax:  [phone]  Occupational Therapy Treatment  Patient Details  Name: [name] MRN: [id number] Date of Birth: Feb 20, 1970 Referring Provider: Dr. Karlton Lemon  Encounter Date: 12/27/2015      OT End of Session - 12/27/15 1140    Visit Number 9   Number of Visits 12   Date for OT Re-Evaluation 12/27/15   Authorization Type [employer] - G code needed   Authorization - Visit Number 9   Authorization - Number of Visits 10   OT Start Time 0900  Pt arrived 15 min. late   OT Stop Time 0930   OT Time Calculation (min) 30 min   Activity Tolerance Patient tolerated treatment well      Past Medical History:  Diagnosis Date  . Achilles tendinitis   . Achilles tendinitis   . ADHD (attention deficit hyperactivity disorder)   . Allergy   . Arthritis   . Bipolar affective ([place])   . Bipolar disorder ([place])   . Eczema   . Ganglion cyst 09/29/2009   left wrist (2 cyst)  . Hyperprolactinemia ([place])   . Hypertension   . Lipoma   . Migraine   . Personality disorder   . Pes planus     Past Surgical History:  Procedure Laterality Date  . ANKLE SURGERY  12/88   left   . chest nodule  1990?   rt chest wall nodule removal  . GANGLION CYST EXCISION  2011  . lipoma removal    . right bunioectomy    . SHOULDER SURGERY  01/13/2011   right, partial tear  . tumor resection left thigh      There were no vitals filed for this visit.      Subjective Assessment - 12/27/15 0905    Subjective  It was sore after working 2 long days in a row   Pertinent History Rt shoulder surgery from RTC   Patient Stated Goals to decr. pain and restore function   Currently in Pain? No/denies                      OT Treatments/Exercises (OP) - 12/27/15 0001      Wrist Exercises   Other wrist exercises Forearm gym x 4 full  revolutions for wrist/forearm motion. Wrist winder x 4 full revolutions with 2 lb weight     Hand Exercises   Other Hand Exercises Gripper set at 35 lbs resistance to pick up blocks Lt hand for sustained grip strength with mod difficulty and drops and 2-3 small rest breaks   Other Hand Exercises Clothespin activity for lateral pinch strength: red to black resistance                  OT Short Term Goals - 12/15/15 0910      OT SHORT TERM GOAL #1   Title Independent with initial A/ROM HEP (All STG's due 12/06/15)   Time 3   Period Weeks   Status Achieved     OT SHORT TERM GOAL #2   Title Pain to be 4/10 or under with light functional tasks and A/ROM ex's   Baseline b/t 7-9/10   Time 3   Period Weeks   Status Achieved  met except work tasks (up to 6/10 at work)      [place] #3  Title Pt to verbalize understanding with task modifications, compensatory strategies and A/E needs to reduce pain   Time 3   Period Weeks   Status Achieved           OT Long Term Goals - 12/15/15 0911      OT LONG TERM GOAL #1   Title Independent with updated strengthening HEP (All LTG's due 12/27/15)    Time 6   Period Weeks   Status Achieved     OT LONG TERM GOAL #2   Title Improve grip strength Lt hand by 10 lbs or greater to open jars/containers   Baseline eval = 20 lbs (Rt = 45 lbs)   Time 6   Period Weeks   Status Achieved  Lt grip = 60 lbs     OT LONG TERM GOAL #3   Title Lateral and tip pinch Lt thumb to improve by 3 lbs or greater for opening containers   Baseline eval = 3 lbs for both (Rt = 11.5 for both)    Time 6   Period Weeks   Status On-going     OT LONG TERM GOAL #4   Title Lt thumb MP flexion to increase to 50* or greater for functional tasks   Baseline eval = 40* (Rt = 55*)   Time 6   Period Weeks   Status Achieved  12/15/15: 50*     OT LONG TERM GOAL #5   Title Improve coordination Lt hand as evidenced by decreasing speed on 9 hole peg test  to 35 sec. or less   Baseline eval = 41.25 sec   Time 6   Period Weeks   Status Achieved  12/15/15: 29.66 sec.                Plan - 12/27/15 1141    Clinical Impression Statement Pt progressing towards remaining goal. Pt reports no pain today but some soreness after working 2 consecutive long days   Rehab Potential Good   OT Frequency 2x / week   OT Duration 6 weeks   OT Treatment/Interventions Self-care/ADL training;Moist Heat;Fluidtherapy;DME and/or AE instruction;Splinting;Patient/family education;Contrast Bath;Compression bandaging;Therapeutic exercises;Ultrasound;Therapeutic activities;Cryotherapy;Iontophoresis;Passive range of motion;Electrical Stimulation;Parrafin;Manual Therapy   Plan assess remaining LTG and d/c next session      Patient will benefit from skilled therapeutic intervention in order to improve the following deficits and impairments:  Decreased coordination, Decreased range of motion, Impaired flexibility, Increased edema, Impaired sensation, Decreased knowledge of use of DME, Impaired UE functional use, Pain, Decreased strength  Visit Diagnosis: Muscle weakness (generalized)  Stiffness of left hand, not elsewhere classified  Other symptoms and signs involving the musculoskeletal system    Problem List Patient Active Problem List   Diagnosis Date Noted  . Pain of left thumb 10/07/2015  . 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 Strain of left thumb 02/11/2015  . Strain of right forearm 02/11/2015  . Right shoulder pain 12/31/2014  . Injury of right little finger 12/31/2014  . Episodic cluster headache, 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  . 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 Hypersomnia, recurrent 12/02/2014  . Migraine aura, persistent, intractable, with status migrainosus 12/02/2014  . Lower back injury 09/30/2014  . Bipolar I disorder, most recent episode depressed ([place])   .  MDD (major depressive disorder), recurrent, severe, with psychosis ([place]) 09/23/2014  . Injury of left index finger 08/20/2014  . Right ankle sprain 06/01/2014  . Contusion, multiple sites 06/01/2014  .  Strain of right gastrocnemius muscle 06/01/2014  . Phonophobia 05/04/2014  . Photophobia of both eyes 05/04/2014  . Emotionally unstable borderline personality disorder 05/04/2014  . Nausea with vomiting 05/04/2014  . Mixed bipolar I disorder ([place])   . Bipolar I disorder, most recent episode mixed ([place]) 04/18/2014  . Bipolar affective disorder, depressed, severe ([place]) 04/12/2014  . Suicidal ideation 04/12/2014  . Injury of right shoulder and upper arm 02/17/2014  . Left leg pain 06/03/2013  . Right hip pain 06/03/2013  . Migraine with status migrainosus 01/08/2013  . Personality disorder   . Chronic migraine 05/08/2012  . Contact dermatitis 11/27/2011  . Major depressive disorder, recurrent episode ([place]) 10/27/2011  . Generalized anxiety disorder 10/27/2011  . 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 Borderline personality disorder 10/27/2011  . Right foot pain 09/28/2011  . 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 Ganglion cyst 09/29/2009  . PES PLANUS 07/01/2008  . BIPOLAR DISORDER UNSPECIFIED 06/09/2008    Carey Bullocks, OTR/L 12/27/2015, 11:42 AM  [facility] [address]  [place], [place], [address] Phone: [phone]   Fax:  [phone]  Name: DANNIE WOOLEN MRN: [id number] Date of Birth: 03-03-1970

## 2015-12-29 ENCOUNTER — Ambulatory Visit: Payer: PPO | Admitting: Occupational Therapy

## 2015-12-29 DIAGNOSIS — M6281 Muscle weakness (generalized): Secondary | ICD-10-CM

## 2015-12-29 DIAGNOSIS — M25542 Pain in joints of left hand: Secondary | ICD-10-CM

## 2015-12-29 NOTE — Therapy (Signed)
[name] [address]  [place], [place], [address] Phone: [phone]   Fax:  [phone]  Occupational Therapy Treatment  Patient Details  Name: [name] MRN: [id number] Date of Birth: [birth year] Referring Provider: Dr. Karlton Lemon  Encounter Date: 12/29/2015      OT End of Session - 12/29/15 0918    Visit Number 10   Number of Visits 12   Date for OT Re-Evaluation 12/27/15   Authorization Type [employer] - G code needed   Authorization - Visit Number 10   Authorization - Number of Visits 10   OT Start Time 0849   OT Stop Time 0930   OT Time Calculation (min) 41 min      Past Medical History:  Diagnosis Date  . Achilles tendinitis   . Achilles tendinitis   . ADHD (attention deficit hyperactivity disorder)   . Allergy   . Arthritis   . Bipolar affective ([place])   . Bipolar disorder ([place])   . Eczema   . Ganglion cyst 09/29/2009   left wrist (2 cyst)  . Hyperprolactinemia ([place])   . Hypertension   . Lipoma   . Migraine   . Personality disorder   . Pes planus     Past Surgical History:  Procedure Laterality Date  . ANKLE SURGERY  12/88   left   . chest nodule  1990?   rt chest wall nodule removal  . GANGLION CYST EXCISION  2011  . lipoma removal    . right bunioectomy    . SHOULDER SURGERY  01/13/2011   right, partial tear  . tumor resection left thigh      There were no vitals filed for this visit.      Subjective Assessment - 12/29/15 0849    Subjective  It bothered me a lot last night and it's sore today   Pertinent History Rt shoulder surgery from RTC   Patient Stated Goals to decr. pain and restore function   Currently in Pain? Yes   Pain Score 3    Pain Location --  Thumb   Pain Orientation Left   Pain Descriptors / Indicators Sore   Pain Type Acute pain   Pain Onset More than a month ago   Pain Frequency Intermittent   Aggravating Factors  work tasks     Pain Relieving Factors ice, pain meds, brace                      OT Treatments/Exercises (OP) - 12/29/15 0001      ADLs   ADL Comments Assessed progress to date and remaining LTG - see goal section     Hand Exercises   Other Hand Exercises Gripper set at 35 lbs resistance to pick up blocks Lt hand for sustained grip strength with min difficulty/min drops. Upgraded to green putty resistance for grip strength only and issued putty. Pt instructed to continue with red putty for pinch strength   Other Hand Exercises Clothespin activity for lateral pinch strength: red to blue resistance     Cryotherapy   Number Minutes Cryotherapy 8 Minutes   Cryotherapy Location --  Lt thumb   Type of Cryotherapy Ice massage  due to soreness today                  OT Short Term Goals - 12/15/15 0910      OT SHORT TERM GOAL #1  Title Independent with initial A/ROM HEP (All STG's due 12/06/15)   Time 3   Period Weeks   Status Achieved     OT SHORT TERM GOAL #2   Title Pain to be 4/10 or under with light functional tasks and A/ROM ex's   Baseline b/t 7-9/10   Time 3   Period Weeks   Status Achieved  met except work tasks (up to 6/10 at work)      OT [place] #3   Title Pt to verbalize understanding with task modifications, compensatory strategies and A/E needs to reduce pain   Time 3   Period Weeks   Status Achieved           OT Long Term Goals - 2016/01/05 0920      OT LONG TERM GOAL #1   Title Independent with updated strengthening HEP (All LTG's due 12/27/15)    Time 6   Period Weeks   Status Achieved     OT LONG TERM GOAL #2   Title Improve grip strength Lt hand by 10 lbs or greater to open jars/containers   Baseline eval = 20 lbs (Rt = 45 lbs)   Time 6   Period Weeks   Status Achieved  Lt grip = 60 lbs     OT LONG TERM GOAL #3   Title Lateral and tip pinch Lt thumb to improve by 3 lbs or greater for opening containers   Baseline eval = 3  lbs for both (Rt = 11.5 for both)    Time 6   Period Weeks   Status Achieved  Lateral and tip pinch = 7 lbs     OT LONG TERM GOAL #4   Title Lt thumb MP flexion to increase to 50* or greater for functional tasks   Baseline eval = 40* (Rt = 55*)   Time 6   Period Weeks   Status Achieved  12/15/15: 50*     OT LONG TERM GOAL #5   Title Improve coordination Lt hand as evidenced by decreasing speed on 9 hole peg test to 35 sec. or less   Baseline eval = 41.25 sec   Time 6   Period Weeks   Status Achieved  12/15/15: 29.66 sec.                Plan - January 05, 2016 0937    Clinical Impression Statement Pt met all LTG's at this time. Pt has had some increased soreness this week but contributes to working more.    Plan D/C O.Darene Lamer    Consulted and Agree with Plan of Care Patient      Patient will benefit from skilled therapeutic intervention in order to improve the following deficits and impairments:     Visit Diagnosis: Muscle weakness (generalized)  Pain in joints of left hand      G-Codes - 05-Jan-2016 0920    Functional Assessment Tool Used Lt thumb pain 3/10, Lt grip = 60 lbs, Lt lateral pinch 7 lbs   Functional Limitation Carrying, moving and handling objects   Carrying, Moving and Handling Objects Goal Status ([id number]) At least 1 percent but less than 20 percent impaired, limited or restricted   Carrying, Moving and Handling Objects Discharge Status [phone]) At least 1 percent but less than 20 percent impaired, limited or restricted      Problem List Patient Active Problem List   Diagnosis Date Noted  . Pain of left thumb 10/07/2015  . Insomnia due to mental  disorder 02/17/2015  . Strain of left thumb 02/11/2015  . Strain of right forearm 02/11/2015  . Right shoulder pain 12/31/2014  . Injury of right little finger 12/31/2014  . Episodic cluster headache, 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  . 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 Parasomnia overlap  disorder 12/02/2014  . Hypersomnia, recurrent 12/02/2014  . Migraine aura, persistent, intractable, with status migrainosus 12/02/2014  . Lower back injury 09/30/2014  . Bipolar I disorder, most recent episode depressed ([place])   . MDD (major depressive disorder), recurrent, severe, with psychosis ([place]) 09/23/2014  . Injury of left index finger 08/20/2014  . Right ankle sprain 06/01/2014  . Contusion, multiple sites 06/01/2014  . Strain of right gastrocnemius muscle 06/01/2014  . Phonophobia 05/04/2014  . Photophobia of both eyes 05/04/2014  . Emotionally unstable borderline personality disorder 05/04/2014  . Nausea with vomiting 05/04/2014  . Mixed bipolar I disorder ([place])   . Bipolar I disorder, most recent episode mixed ([place]) 04/18/2014  . Bipolar affective disorder, depressed, severe ([place]) 04/12/2014  . Suicidal ideation 04/12/2014  . Injury of right shoulder and upper arm 02/17/2014  . Left leg pain 06/03/2013  . Right hip pain 06/03/2013  . Migraine with status migrainosus 01/08/2013  . Personality disorder   . Chronic migraine 05/08/2012  . Contact dermatitis 11/27/2011  . Major depressive disorder, recurrent episode ([place]) 10/27/2011  . Generalized anxiety disorder 10/27/2011  . 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 Borderline personality disorder 10/27/2011  . Right foot pain 09/28/2011  . 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 Ganglion cyst 09/29/2009  . PES PLANUS 07/01/2008  . BIPOLAR DISORDER UNSPECIFIED 06/09/2008     OCCUPATIONAL THERAPY DISCHARGE SUMMARY  Visits from Start of Care: 10  Current functional level related to goals / functional outcomes: SEE ABOVE   Remaining deficits: Mild pain occasionally Strength Lt thumb/hand   Education / Equipment: Pt has been issued HEP's, task modifications and joint protection techniques, pain management strategies, splint  wear and care  Plan: Patient agrees to discharge.  Patient goals were met. Patient is being discharged due to meeting the stated rehab goals.  ?????        Carey Bullocks, OTR/L 12/29/2015, 9:41 AM  [facility] [address]  [place], [place], [address] Phone: [phone]   Fax:  [phone]  Name: [name] MRN: [id number] Date of Birth: 1970/03/10

## 2016-01-06 DIAGNOSIS — H31002 Unspecified chorioretinal scars, left eye: Secondary | ICD-10-CM | POA: Diagnosis not present

## 2016-01-06 DIAGNOSIS — H524 Presbyopia: Secondary | ICD-10-CM | POA: Diagnosis not present

## 2016-01-06 DIAGNOSIS — H26493 Other secondary cataract, bilateral: Secondary | ICD-10-CM | POA: Diagnosis not present

## 2016-01-06 DIAGNOSIS — H43813 Vitreous degeneration, bilateral: Secondary | ICD-10-CM | POA: Diagnosis not present

## 2016-01-28 DIAGNOSIS — L258 Unspecified contact dermatitis due to other agents: Secondary | ICD-10-CM | POA: Diagnosis not present

## 2016-02-02 DIAGNOSIS — H0014 Chalazion left upper eyelid: Secondary | ICD-10-CM | POA: Diagnosis not present

## 2016-02-02 DIAGNOSIS — T1512XA Foreign body in conjunctival sac, left eye, initial encounter: Secondary | ICD-10-CM | POA: Diagnosis not present

## 2016-02-02 DIAGNOSIS — H5712 Ocular pain, left eye: Secondary | ICD-10-CM | POA: Diagnosis not present

## 2016-02-08 ENCOUNTER — Other Ambulatory Visit: Payer: Self-pay | Admitting: Neurology

## 2016-02-08 DIAGNOSIS — G43011 Migraine without aura, intractable, with status migrainosus: Secondary | ICD-10-CM

## 2016-02-10 DIAGNOSIS — B355 Tinea imbricata: Secondary | ICD-10-CM | POA: Diagnosis not present

## 2016-02-21 ENCOUNTER — Ambulatory Visit (INDEPENDENT_AMBULATORY_CARE_PROVIDER_SITE_OTHER): Payer: PPO | Admitting: Adult Health

## 2016-02-21 ENCOUNTER — Encounter: Payer: Self-pay | Admitting: Adult Health

## 2016-02-21 VITALS — BP 113/80 | HR 66 | Ht 70.0 in | Wt 253.4 lb

## 2016-02-21 DIAGNOSIS — Z5181 Encounter for therapeutic drug level monitoring: Secondary | ICD-10-CM

## 2016-02-21 DIAGNOSIS — G43009 Migraine without aura, not intractable, without status migrainosus: Secondary | ICD-10-CM | POA: Diagnosis not present

## 2016-02-21 DIAGNOSIS — F319 Bipolar disorder, unspecified: Secondary | ICD-10-CM | POA: Diagnosis not present

## 2016-02-21 NOTE — Progress Notes (Signed)
I agree with the assessment and plan as directed by NP .The patient is known to me .   Tai Syfert, MD  

## 2016-02-21 NOTE — Progress Notes (Signed)
PATIENT: [name] DOB: May 14, 1970  REASON FOR VISIT: follow up- migraine headaches HISTORY FROM: patient  HISTORY OF PRESENT ILLNESS: [name] is a 46 year old female with a history of migraine headaches. She returns today for follow-up. She remains on Depakote. She states that this continues to work well for her. She does report that she suffered a concussion in October. She states that a piece of glass fell and hit her in the head. She did go to the emergency room 2 days after the event and her CT of the head was normal. She states that her headaches have gotten slightly worse as well as the dizziness since her concussion She continues to follow up with a psychiatrist. She states that [name] is retiring so she will now be seeing the nurse practitioner at that office. She returns today for an evaluation.  HISTORY per Dr. Edwena [name] notes: [name] is a 46 y.o. female , seen here as a referral  from [name] for a sleep consultation.  [name] has been followed in this office for migraine headaches but is today referred for a sleep consultation. She endorsed the Epworth sleepiness score between 13 and 15 points during her last visit with a psychiatrist. She also reports more myoclonic appearing jerks, both lower extremities with jump at the same time when she is just ready to fall asleep and prolonged her sleep latency thereby.  Chief complaint according to patient : "I fall asleep all the time. ";  " I have more migraines- they split right in the middle , affect my left face ".   Sleep habits are as follows: The patient reports that she does not have any screen light or lights penetrating her bedroom which is described as core, quiet and dark. She sleeps alone in the bedroom. She usually goes to bed around 11:30 and 12 PM, usually it will take her about 30 minutes to fall asleep. She has noticed that both legs seem to gels and get jumpy by the time she feels  ready to enter sleep. She finds herself them again unable to fall asleep. These may not to be restless legs ;she does not describe creepy crawly sensations, rather myoclonic jerks. She has fallen out of bed several times sometimes in relation to visit nightmarish dreams being chased being under threat fearing for her life. These visit dreams are very detailed areas she has installed bed rails to prevent falling. She does usually not sleep walk. Mother has not witnessed any sleep talking either, but yelling . She lives in the same home as her mother, the bedrooms are across the hall from each other. Her mother has sometimes woken her when she seemed to have a nightmare and may have yelled or acted out. Nightmares seem to occur within about 2 hours of sleep initiation. Usually only once per night. They don't occur every night either but perhaps 4 or 5 times a month. She states that she scrambles to switch the light on and that that gives her certainty that her dream is not reality. She has placed a flashlight on her nightstand for that reason. She has woken up finding that the nightstand had been moved or  she finds herself on the floor. If she has no appointments to keep in the morning she rises by about 9 AM, she relies on an alarm and she often hits the snooze button repeatedly. Most of the time she is neither refreshed nor restored in the  morning. Some morning she will wake up with a dull headache throbbing, this may lift as the day goes on. Other nights she will wake up from a sharp stabbing headache that has a attack character. These cluster headaches usually occur more towards the morning hours but do not occur usually was in the first hour of sleep. She treats these with Imitrex. She will try to sleep these headaches off.  Social history: The patient will have a caffeine intake of about 2 sodas a day plus iced teas, rarely coffee. She will drink tea also for lunch or dinner. Non smoker, non alcohol  drinker. Single , graduate of a local college.   Hypersomnia with extreme fatigue and depression. She is finally gainfully employed.   Mother has insomnia, has trouble falling asleep and staying asleep. Father sleep habits are not known. Brother seems not to snore or have apnea.  Split study with capnography and expanded EEG montage.            I will also order an IV treatment for the patient's migrainous headache that she presented with today.            Depakote 500 mg IV with 25 mg of Medrol. If partial relief from headaches, this IV can be repeated once.     02-17-15,  [name] is here today for her revisit after sleep study. Her PSG revealed only mild obstructive sleep apnea the overall AHI was 8.3 exacerbated during REM sleep during REM sleep her AHI was 21.8. She had about 23% of the night spent in rem sleep which is highly unusual for patient on multiple psychotropic medications and antidepressants. She has reported nightmares and vivid dreams and there was some sleep related talking and even at one time yelling. I did not find a sleep correlation to her morning headaches and episodic nocturnal cluster headaches. The high degree of sleepiness and fatigue remains unexplained. I would offer her to treat this mild degree of apnea given her comorbidities. The patient slept rather well after 1:30 AM but still had 3 brief periods of wakefulness. Again the use of CPAP would be optional in her case but my concern is that it would make her insomnia worse. She slept all night supine.   Interval history from 05/31/2015. [name] underwent a second cataract surgery. Her eyes are doing well, there were no complications no infections etc. She has developed a left temporal headache and is here today to see if she can get some Depakote infusion. I will also write for Indocin given that she has hours of throbbing left-sided headaches associated with some nausea.  Interval history from 08/18/2015. Ms.  [name] is here today with a headache of a duration of 6 days. Since she has had relief with Depakote infusions I will order this for her today. I may also use a low-dose Solu-Medrol in the mix. I will also order some Zofran to be given to help with the nausea.  REVIEW OF SYSTEMS: Out of a complete 14 system review of symptoms, the patient complains only of the following symptoms, and all other reviewed systems are negative.  Memory loss, dizziness, headache, urgency, nausea, restless leg, insomnia, depression, eye pain, light sensitivity  ALLERGIES: Allergies  Allergen Reactions  . Adhesive [Tape] Itching and Rash    Also reacted to Steri Strips and Band-Aids.  . Dilaudid [Hydromorphone Hcl] Itching  . Morphine Nausea And Vomiting  . Penicillins Hives    Has patient had a PCN reaction causing  immediate rash, facial/tongue/throat swelling, SOB or lightheadedness with hypotension: YES Has patient had a PCN reaction causing severe rash involving mucus membranes or skin necrosis: NO Has patient had a PCN reaction that required hospitalization NO Has patient had a PCN reaction occurring within the last 10 years:NO If all of the above answers are "NO", then may proceed with Cephalosporin use.  Marland Kitchen Percocet [Oxycodone-Acetaminophen] Itching  . Prednisone Hives  . Provera [Medroxyprogesterone Acetate] Other (See Comments)    Causes manic episodes  . Ultram [Tramadol Hcl] Itching    HOME MEDICATIONS: Outpatient Medications Prior to Visit  Medication Sig Dispense Refill  . azelastine (ASTELIN) 0.1 % nasal spray instill 1 to 2 sprays into each nostril twice a day  0  . divalproex (DEPAKOTE ER) 500 MG 24 hr tablet take 3 tablets by mouth at bedtime 90 tablet 1  . hydrocortisone cream 0.5 % Apply 1 application topically daily as needed for itching.    . hydrOXYzine (VISTARIL) 50 MG capsule take 1 capsule by mouth twice a day and 1 capsule at bedtime  0  . ibuprofen (ADVIL,MOTRIN) 800 MG tablet  Take 1 tablet (800 mg total) by mouth every 8 (eight) hours as needed. 90 tablet 0  . lamoTRIgine (LAMICTAL) 200 MG tablet Take 1 tablet (200 mg total) by mouth at bedtime. For mood stabilization 30 tablet 0  . Lurasidone HCl 120 MG TABS Take 1 tablet by mouth every evening.    . metoprolol succinate (TOPROL-XL) 100 MG 24 hr tablet Take 1 tablet (100 mg total) by mouth daily. Take with or immediately following a meal for blood pressure control. 30 tablet 0  . Multiple Vitamin (MULTIVITAMIN WITH MINERALS) TABS tablet Take 1 tablet by mouth daily. For nutritional supplementation. 30 tablet 0  . ondansetron (ZOFRAN) 4 MG tablet Take 1 tablet (4 mg total) by mouth every 8 (eight) hours as needed for nausea or vomiting. 20 tablet 0  . Polyvinyl Alcohol-Povidone (REFRESH OP) Apply 1 tablet to eye daily as needed. For dry eyes    . promethazine (PHENERGAN) 25 MG tablet Take 1 tablet (25 mg total) by mouth every 6 (six) hours as needed for nausea or vomiting (or headache). 12 tablet 0  . sodium chloride 0.9 % SOLN 100 mL with valproate 500 MG/5ML SOLN 500 mg Inject 500 mg into the vein once as needed. To be infused over  a 15 minutes period, repeat once if partial relief.    . sodium chloride 0.9 % SOLN 100 mL with valproate 500 MG/5ML SOLN 500 mg Inject 500 mg into the vein once. 500 mg 1  . sodium chloride 0.9 % SOLN 100 mL with valproate 500 MG/5ML SOLN 500 mg Inject 500 mg into the vein once as needed. To be infused over  a 15 minutes period, repeat once if partial relief. 500 mg 1  . SUMAtriptan (IMITREX) 6 MG/0.5ML SOLN injection Inject 0.5 mLs (6 mg total) into the skin every 2 (two) hours as needed for migraine or headache. May repeat in 2 hours if headache persists or recurs.  0  . SUMAtriptan Succinate Refill 6 MG/0.5ML SOCT inject INTO THE SKIN AS NEEDED FOR MIGRAINE OR HEADACHE;  MAY REPEAT IN 2 HOURS IF HEADACHE PERSISTS OR RECURS 3 cartridge 5  . zolpidem (AMBIEN) 10 MG tablet Take 10 mg by mouth  at bedtime.    . hydrochlorothiazide (HYDRODIURIL) 25 MG tablet Take 1 tablet (25 mg total) by mouth daily. For blood pressure control. (Patient not  taking: Reported on 11/16/2015) 30 tablet 0  . indomethacin (INDOCIN) 25 MG capsule take 1 capsule by mouth twice a day with meals (Patient not taking: Reported on 11/16/2015) 30 capsule 0  . lurasidone (LATUDA) 80 MG TABS tablet Take 1 tablet (80 mg total) by mouth daily at 6 PM. For mood control (Patient not taking: Reported on 11/16/2015) 30 tablet 0  . meloxicam (MOBIC) 15 MG tablet Take 1 tablet (15 mg total) by mouth daily. 30 tablet 2  . naproxen (NAPROSYN) 375 MG tablet Take 1 tablet (375 mg total) by mouth 2 (two) times daily as needed for moderate pain. (Patient not taking: Reported on 02/21/2016) 20 tablet 0   No facility-administered medications prior to visit.     PAST MEDICAL HISTORY: Past Medical History:  Diagnosis Date  . Achilles tendinitis   . Achilles tendinitis   . ADHD (attention deficit hyperactivity disorder)   . Allergy   . Arthritis   . Bipolar affective ([place])   . Bipolar disorder ([place])   . Eczema   . Ganglion cyst 09/29/2009   left wrist (2 cyst)  . Hyperprolactinemia ([place])   . Hypertension   . Lipoma   . Migraine   . Personality disorder   . Pes planus     PAST SURGICAL HISTORY: Past Surgical History:  Procedure Laterality Date  . ANKLE SURGERY  12/88   left   . chest nodule  1990?   rt chest wall nodule removal  . GANGLION CYST EXCISION  2011  . lipoma removal    . right bunioectomy    . SHOULDER SURGERY  01/13/2011   right, partial tear  . tumor resection left thigh      FAMILY HISTORY: Family History  Problem Relation Age of Onset  . Hypertension Mother   . Hyperlipidemia Mother   . Heart attack Father   . Heart disease Father   . Hypertension Father   . Bipolar disorder Father   . Diabetes Paternal Grandfather   . Heart disease Maternal Aunt   . Breast cancer Maternal Aunt   . Heart  disease Maternal Grandmother   . Cancer Maternal Grandmother     colon    SOCIAL HISTORY: Social History   Social History  . Marital status: Single    Spouse name: N/A  . Number of children: 0  . Years of education: N/A   Occupational History  .  [employer]   Social History Main Topics  . Smoking status: Never Smoker  . Smokeless tobacco: Never Used  . Alcohol use No  . Drug use: No  . Sexual activity: No   Other Topics Concern  . Not on file   Social History Narrative   Caffeine  2 sodas daily, 1 cup coffee daily.      PHYSICAL EXAM  Vitals:   02/21/16 0922  BP: 113/80  Pulse: 66  Weight: 253 lb 6.4 oz (114.9 kg)  Height: 5\' 10"  (1.778 m)   Body mass index is 36.36 kg/m.  Generalized: Well developed, in no acute distress   Neurological examination  Mentation: Alert oriented to time, place, history taking. Follows all commands speech and language fluent Cranial nerve II-XII: Pupils were equal round reactive to light. Extraocular movements were full, visual field were full on confrontational test. Facial sensation and strength were normal. Uvula tongue midline. Head turning and shoulder shrug  were normal and symmetric. Motor: The motor testing reveals 5 over 5 strength of all 4 extremities. Good  symmetric motor tone is noted throughout.  Sensory: Sensory testing is intact to soft touch on all 4 extremities. No evidence of extinction is noted.  Coordination: Cerebellar testing reveals good finger-nose-finger and heel-to-shin bilaterally.  Gait and station: Gait is normal. Tandem gait is unsteady. Romberg is negative. No drift is seen.  Reflexes: Deep tendon reflexes are symmetric and normal bilaterally.   DIAGNOSTIC DATA (LABS, IMAGING, TESTING) - I reviewed patient records, labs, notes, testing and imaging myself where available.  Lab Results  Component Value Date   WBC 7.9 09/24/2014   HGB 13.9 09/24/2014   HCT 42.1 09/24/2014   MCV 92.9 09/24/2014   PLT  235 09/24/2014      Component Value Date/Time   NA 142 05/31/2015 0911   K 4.7 05/31/2015 0911   CL 98 05/31/2015 0911   CO2 25 05/31/2015 0911   GLUCOSE 99 05/31/2015 0911   GLUCOSE 91 09/25/2014 0615   BUN 7 05/31/2015 0911   CREATININE 0.96 05/31/2015 0911   CREATININE 0.83 09/11/2011 1524   CALCIUM 9.5 05/31/2015 0911   PROT 5.7 (L) 05/31/2015 0911   ALBUMIN 3.9 05/31/2015 0911   AST 19 05/31/2015 0911   ALT 13 05/31/2015 0911   ALKPHOS 66 05/31/2015 0911   BILITOT 0.4 05/31/2015 0911   GFRNONAA 72 05/31/2015 0911   GFRAA 83 05/31/2015 0911   Lab Results  Component Value Date   CHOL 185 09/25/2014   HDL 55 09/25/2014   LDLCALC 96 09/25/2014   TRIG 171 (H) 09/25/2014   CHOLHDL 3.4 09/25/2014   Lab Results  Component Value Date   HGBA1C 5.4 09/25/2014   No results found for: [id number] Lab Results  Component Value Date   TSH 1.277 04/14/2014      ASSESSMENT AND PLAN 46 y.o. year old female  has a past medical history of Achilles tendinitis; Achilles tendinitis; ADHD (attention deficit hyperactivity disorder); Allergy; Arthritis; Bipolar affective ([place]); Bipolar disorder ([place]); Eczema; Ganglion cyst (09/29/2009); Hyperprolactinemia ([place]); Hypertension; Lipoma; Migraine; Personality disorder; and Pes planus. here with:  1. Migraine headaches 2. Bipolar Disorder  The patient will continue on Depakote. I will check blood work today. Patient advised that if her headache frequency or severity worsens she should let us know. I will refill her Imitrex today. She is encouraged to continue to follow-up with her psychiatrist for treatment of bipolar disorder and depression.     Ward Givens, MSN, NP-C 02/21/2016, 9:50 AM [facility] [address], [place], [place] [address] [phone]

## 2016-02-21 NOTE — Patient Instructions (Signed)
Continue Depakote Blood work today If your symptoms worsen or you develop new symptoms please let us know.

## 2016-02-22 ENCOUNTER — Telehealth: Payer: Self-pay | Admitting: *Deleted

## 2016-02-22 LAB — CBC WITH DIFFERENTIAL/PLATELET
Basophils Absolute: 0 10*3/uL (ref 0.0–0.2)
Basos: 0 %
EOS (ABSOLUTE): 0.1 10*3/uL (ref 0.0–0.4)
Eos: 3 %
Hematocrit: 42.7 % (ref 34.0–46.6)
Hemoglobin: 13.5 g/dL (ref 11.1–15.9)
Immature Grans (Abs): 0 10*3/uL (ref 0.0–0.1)
Immature Granulocytes: 0 %
Lymphocytes Absolute: 2.5 10*3/uL (ref 0.7–3.1)
Lymphs: 46 %
MCH: 30.8 pg (ref 26.6–33.0)
MCHC: 31.6 g/dL (ref 31.5–35.7)
MCV: 97 fL (ref 79–97)
Monocytes Absolute: 0.6 10*3/uL (ref 0.1–0.9)
Monocytes: 11 %
Neutrophils Absolute: 2.2 10*3/uL (ref 1.4–7.0)
Neutrophils: 40 %
Platelets: 198 10*3/uL (ref 150–379)
RBC: 4.39 x10E6/uL (ref 3.77–5.28)
RDW: 13.4 % (ref 12.3–15.4)
WBC: 5.4 10*3/uL (ref 3.4–10.8)

## 2016-02-22 LAB — COMPREHENSIVE METABOLIC PANEL
ALT: 12 IU/L (ref 0–32)
AST: 16 IU/L (ref 0–40)
Albumin/Globulin Ratio: 2.3 — ABNORMAL HIGH (ref 1.2–2.2)
Albumin: 3.9 g/dL (ref 3.5–5.5)
Alkaline Phosphatase: 69 IU/L (ref 39–117)
BUN/Creatinine Ratio: 10 (ref 9–23)
BUN: 10 mg/dL (ref 6–24)
Bilirubin Total: 0.5 mg/dL (ref 0.0–1.2)
CO2: 27 mmol/L (ref 18–29)
Calcium: 9.5 mg/dL (ref 8.7–10.2)
Chloride: 102 mmol/L (ref 96–106)
Creatinine, Ser: 1.03 mg/dL — ABNORMAL HIGH (ref 0.57–1.00)
GFR calc Af Amer: 75 mL/min/{1.73_m2} (ref >59)
GFR calc non Af Amer: 65 mL/min/{1.73_m2} (ref >59)
Globulin, Total: 1.7 g/dL (ref 1.5–4.5)
Glucose: 85 mg/dL (ref 65–99)
Potassium: 4.5 mmol/L (ref 3.5–5.2)
Sodium: 143 mmol/L (ref 134–144)
Total Protein: 5.6 g/dL — ABNORMAL LOW (ref 6.0–8.5)

## 2016-02-22 LAB — VALPROIC ACID LEVEL: Valproic Acid Lvl: 97 ug/mL (ref 50–100)

## 2016-02-22 NOTE — Telephone Encounter (Signed)
LMVM for pt to return call for her lab results.  (if she calls you may tell that they are unremarkable).

## 2016-02-22 NOTE — Telephone Encounter (Signed)
-----   Message from Ward Givens, NP sent at 02/22/2016  9:46 AM EST ----- Lab work unremarkable. Please call patient with results.

## 2016-02-23 NOTE — Telephone Encounter (Signed)
Spoke to pt and relayed that the lab results were unremarkable.  She verbalized understanding.

## 2016-03-02 DIAGNOSIS — L308 Other specified dermatitis: Secondary | ICD-10-CM | POA: Diagnosis not present

## 2016-03-03 DIAGNOSIS — L2389 Allergic contact dermatitis due to other agents: Secondary | ICD-10-CM | POA: Diagnosis not present

## 2016-03-10 DIAGNOSIS — L258 Unspecified contact dermatitis due to other agents: Secondary | ICD-10-CM | POA: Diagnosis not present

## 2016-03-15 DIAGNOSIS — M542 Cervicalgia: Secondary | ICD-10-CM | POA: Diagnosis not present

## 2016-03-15 DIAGNOSIS — M5106 Intervertebral disc disorders with myelopathy, lumbar region: Secondary | ICD-10-CM | POA: Diagnosis not present

## 2016-03-15 DIAGNOSIS — M47892 Other spondylosis, cervical region: Secondary | ICD-10-CM | POA: Diagnosis not present

## 2016-03-22 ENCOUNTER — Ambulatory Visit (INDEPENDENT_AMBULATORY_CARE_PROVIDER_SITE_OTHER): Payer: PPO | Admitting: Family Medicine

## 2016-03-22 ENCOUNTER — Encounter: Payer: Self-pay | Admitting: Family Medicine

## 2016-03-22 DIAGNOSIS — M79645 Pain in left finger(s): Secondary | ICD-10-CM

## 2016-03-22 MED ORDER — MELOXICAM 15 MG PO TABS: 15.0000 mg | ORAL_TABLET | Freq: Every day | ORAL | 2 refills | Status: DC

## 2016-03-22 NOTE — Patient Instructions (Signed)
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 Wear the brace as often as possible to rest this. Continue the prednisone you were prescribed yesterday. Icing 15 minutes at a time 3-4 times a day. Meloxicam 15mg  daily with food for pain and inflammation. If you are not noticing a difference over next 1-2 weeks call me to do a tendon sheath injection.

## 2016-03-23 NOTE — Progress Notes (Signed)
PCP: Aretta Nip, MD  Subjective:   HPI: Patient is a 46 y.o. female here for left thumb pain.  9/6: Patient denies known injury. Patient reports for 2 weeks she's had pain in left thumb, some swelling. Difficulty bending all the way. Pain level 7/10 and sharp pointing to MCP as area of pain. No catching or locking. Worse with any motions of the thumb. Is right handed.  10/20/15: Patient reports she's still having pain in left thumb. Feels more on dorsal aspect of this thumb now. Pain level 6/10, sharp. Better when in the thumbkeeper brace. No swelling. No skin changes. No new injuries. No catching or locking. No numbness or tingling.  03/22/16: Patient reports she did occupational therapy, worse brace until the broke recently. Never completely improved unfortunately. Pain level 7/10 in base of thumb and up the forearm, sharp. Worse when at work (at [employer]), any use of thumb. Has been icing, taking mobic. No skin changes, numbness.  Past Medical History:  Diagnosis Date  . Achilles tendinitis   . Achilles tendinitis   . ADHD (attention deficit hyperactivity disorder)   . Allergy   . Arthritis   . Bipolar affective ([place])   . Bipolar disorder ([place])   . Eczema   . Ganglion cyst 09/29/2009   left wrist (2 cyst)  . Hyperprolactinemia ([place])   . Hypertension   . Lipoma   . Migraine   . Personality disorder   . Pes planus     Current Outpatient Prescriptions on File Prior to Visit  Medication Sig Dispense Refill  . azelastine (ASTELIN) 0.1 % nasal spray instill 1 to 2 sprays into each nostril twice a day  0  . divalproex (DEPAKOTE ER) 500 MG 24 hr tablet take 3 tablets by mouth at bedtime 90 tablet 1  . hydrochlorothiazide (HYDRODIURIL) 25 MG tablet Take 1 tablet (25 mg total) by mouth daily. For blood pressure control. (Patient not taking: Reported on 11/16/2015) 30 tablet 0  . hydrocortisone cream 0.5 % Apply 1 application topically daily as needed for  itching.    . hydrOXYzine (VISTARIL) 50 MG capsule take 1 capsule by mouth twice a day and 1 capsule at bedtime  0  . ibuprofen (ADVIL,MOTRIN) 800 MG tablet Take 1 tablet (800 mg total) by mouth every 8 (eight) hours as needed. 90 tablet 0  . lamoTRIgine (LAMICTAL) 200 MG tablet Take 1 tablet (200 mg total) by mouth at bedtime. For mood stabilization 30 tablet 0  . Lurasidone HCl 120 MG TABS Take 1 tablet by mouth every evening.    . metoprolol succinate (TOPROL-XL) 100 MG 24 hr tablet Take 1 tablet (100 mg total) by mouth daily. Take with or immediately following a meal for blood pressure control. 30 tablet 0  . Multiple Vitamin (MULTIVITAMIN WITH MINERALS) TABS tablet Take 1 tablet by mouth daily. For nutritional supplementation. 30 tablet 0  . ondansetron (ZOFRAN) 4 MG tablet Take 1 tablet (4 mg total) by mouth every 8 (eight) hours as needed for nausea or vomiting. 20 tablet 0  . Polyvinyl Alcohol-Povidone (REFRESH OP) Apply 1 tablet to eye daily as needed. For dry eyes    . promethazine (PHENERGAN) 25 MG tablet Take 1 tablet (25 mg total) by mouth every 6 (six) hours as needed for nausea or vomiting (or headache). 12 tablet 0  . sodium chloride 0.9 % SOLN 100 mL with valproate 500 MG/5ML SOLN 500 mg Inject 500 mg into the vein once as needed. To be  infused over  a 15 minutes period, repeat once if partial relief.    . SUMAtriptan Succinate Refill 6 MG/0.5ML SOCT inject INTO THE SKIN AS NEEDED FOR MIGRAINE OR HEADACHE;  MAY REPEAT IN 2 HOURS IF HEADACHE PERSISTS OR RECURS 3 cartridge 5  . terbinafine (LAMISIL) 250 MG tablet     . zolpidem (AMBIEN) 10 MG tablet Take 10 mg by mouth at bedtime.     No current facility-administered medications on file prior to visit.     Past Surgical History:  Procedure Laterality Date  . ANKLE SURGERY  12/88   left   . chest nodule  1990?   rt chest wall nodule removal  . GANGLION CYST EXCISION  2011  . lipoma removal    . right bunioectomy    . SHOULDER  SURGERY  01/13/2011   right, partial tear  . tumor resection left thigh      Allergies  Allergen Reactions  . Adhesive [Tape] Itching and Rash    Also reacted to Steri Strips and Band-Aids.  . Dilaudid [Hydromorphone Hcl] Itching  . Morphine Nausea And Vomiting  . Penicillins Hives    Has patient had a PCN reaction causing immediate rash, facial/tongue/throat swelling, SOB or lightheadedness with hypotension: YES Has patient had a PCN reaction causing severe rash involving mucus membranes or skin necrosis: NO Has patient had a PCN reaction that required hospitalization NO Has patient had a PCN reaction occurring within the last 10 years:NO If all of the above answers are "NO", then may proceed with Cephalosporin use.  Marland Kitchen Percocet [Oxycodone-Acetaminophen] Itching  . Prednisone Hives  . Provera [Medroxyprogesterone Acetate] Other (See Comments)    Causes manic episodes  . Ultram [Tramadol Hcl] Itching    Social History   Social History  . Marital status: Single    Spouse name: N/A  . Number of children: 0  . Years of education: N/A   Occupational History  .  [employer]   Social History Main Topics  . Smoking status: Never Smoker  . Smokeless tobacco: Never Used  . Alcohol use No  . Drug use: No  . Sexual activity: No   Other Topics Concern  . Not on file   Social History Narrative   Caffeine  2 sodas daily, 1 cup coffee daily.    Family History  Problem Relation Age of Onset  . Hypertension Mother   . Hyperlipidemia Mother   . Heart attack Father   . Heart disease Father   . Hypertension Father   . Bipolar disorder Father   . Diabetes Paternal Grandfather   . Heart disease Maternal Aunt   . Breast cancer Maternal Aunt   . Heart disease Maternal Grandmother   . Cancer Maternal Grandmother     colon    BP 133/83   Pulse (!) 59   Ht 5\' 10"  (1.778 m)   Wt 255 lb (115.7 kg)   BMI 36.59 kg/m   Review of Systems: See HPI above.    Objective:  Physical  Exam:  Gen: NAD, comfortable in exam room  Left thumb: No gross deformity, swelling, bruising. TTP 1st dorsal compartment now.  No MCP, CMC, other tenderness about hand or thumb. FROM at IP, MCP, CMC joints.  Pain on resisted thumb flexion and extension. Positive finkelsteins Negative tinels NVI distally.    Assessment & Plan:  1. Left thumb pain - consistent with dequervains tenosynovitis.  Was given prescription for prednisone that she started yesterday - encouraged  her to finish this out before we consider injection 1st dorsal compartment.  New thumb spica brace to rest this.  Icing, meloxicam after finishing prednisone as well.  If after 1-2 weeks not noticing difference with prednisone to return for injection.

## 2016-03-23 NOTE — Assessment & Plan Note (Signed)
consistent with dequervains tenosynovitis.  Was given prescription for prednisone that she started yesterday - encouraged her to finish this out before we consider injection 1st dorsal compartment.  New thumb spica brace to rest this.  Icing, meloxicam after finishing prednisone as well.  If after 1-2 weeks not noticing difference with prednisone to return for injection.

## 2016-03-27 ENCOUNTER — Ambulatory Visit (INDEPENDENT_AMBULATORY_CARE_PROVIDER_SITE_OTHER): Payer: PPO | Admitting: Family Medicine

## 2016-03-27 ENCOUNTER — Encounter: Payer: Self-pay | Admitting: Family Medicine

## 2016-03-27 VITALS — BP 121/78 | HR 57 | Ht 70.0 in | Wt 255.0 lb

## 2016-03-27 DIAGNOSIS — M79645 Pain in left finger(s): Secondary | ICD-10-CM | POA: Diagnosis not present

## 2016-03-27 DIAGNOSIS — M25542 Pain in joints of left hand: Secondary | ICD-10-CM

## 2016-03-27 DIAGNOSIS — L308 Other specified dermatitis: Secondary | ICD-10-CM | POA: Diagnosis not present

## 2016-03-27 MED ORDER — METHYLPREDNISOLONE ACETATE 40 MG/ML IJ SUSP
20.0000 mg | Freq: Once | INTRAMUSCULAR | Status: AC
  Administered 2016-03-27: 20 mg via INTRA_ARTICULAR

## 2016-03-28 DIAGNOSIS — H43813 Vitreous degeneration, bilateral: Secondary | ICD-10-CM | POA: Diagnosis not present

## 2016-03-28 NOTE — Progress Notes (Signed)
PCP: Aretta Nip, MD  Subjective:   HPI: Patient is a 46 y.o. female here for left thumb pain.  9/6: Patient denies known injury. Patient reports for 2 weeks she's had pain in left thumb, some swelling. Difficulty bending all the way. Pain level 7/10 and sharp pointing to MCP as area of pain. No catching or locking. Worse with any motions of the thumb. Is right handed.  10/20/15: Patient reports she's still having pain in left thumb. Feels more on dorsal aspect of this thumb now. Pain level 6/10, sharp. Better when in the thumbkeeper brace. No swelling. No skin changes. No new injuries. No catching or locking. No numbness or tingling.  03/22/16: Patient reports she did occupational therapy, worse brace until the broke recently. Never completely improved unfortunately. Pain level 7/10 in base of thumb and up the forearm, sharp. Worse when at work (at [employer]), any use of thumb. Has been icing, taking mobic. No skin changes, numbness.  2/26: Patient returns with 10/10 pain in thumb - would like injection. Using thumb spica brace. No skin changes, numbness.  Past Medical History:  Diagnosis Date  . Achilles tendinitis   . Achilles tendinitis   . ADHD (attention deficit hyperactivity disorder)   . Allergy   . Arthritis   . Bipolar affective ([place])   . Bipolar disorder ([place])   . Eczema   . Ganglion cyst 09/29/2009   left wrist (2 cyst)  . Hyperprolactinemia ([place])   . Hypertension   . Lipoma   . Migraine   . Personality disorder   . Pes planus     Current Outpatient Prescriptions on File Prior to Visit  Medication Sig Dispense Refill  . azelastine (ASTELIN) 0.1 % nasal spray instill 1 to 2 sprays into each nostril twice a day  0  . divalproex (DEPAKOTE ER) 500 MG 24 hr tablet take 3 tablets by mouth at bedtime 90 tablet 1  . hydrochlorothiazide (HYDRODIURIL) 25 MG tablet Take 1 tablet (25 mg total) by mouth daily. For blood pressure control. (Patient not  taking: Reported on 11/16/2015) 30 tablet 0  . hydrocortisone cream 0.5 % Apply 1 application topically daily as needed for itching.    . hydrOXYzine (VISTARIL) 50 MG capsule take 1 capsule by mouth twice a day and 1 capsule at bedtime  0  . ibuprofen (ADVIL,MOTRIN) 800 MG tablet Take 1 tablet (800 mg total) by mouth every 8 (eight) hours as needed. 90 tablet 0  . lamoTRIgine (LAMICTAL) 200 MG tablet Take 1 tablet (200 mg total) by mouth at bedtime. For mood stabilization 30 tablet 0  . Lurasidone HCl 120 MG TABS Take 1 tablet by mouth every evening.    . meloxicam (MOBIC) 15 MG tablet Take 1 tablet (15 mg total) by mouth daily. 30 tablet 2  . metoprolol succinate (TOPROL-XL) 100 MG 24 hr tablet Take 1 tablet (100 mg total) by mouth daily. Take with or immediately following a meal for blood pressure control. 30 tablet 0  . Multiple Vitamin (MULTIVITAMIN WITH MINERALS) TABS tablet Take 1 tablet by mouth daily. For nutritional supplementation. 30 tablet 0  . ondansetron (ZOFRAN) 4 MG tablet Take 1 tablet (4 mg total) by mouth every 8 (eight) hours as needed for nausea or vomiting. 20 tablet 0  . Polyvinyl Alcohol-Povidone (REFRESH OP) Apply 1 tablet to eye daily as needed. For dry eyes    . promethazine (PHENERGAN) 25 MG tablet Take 1 tablet (25 mg total) by mouth every 6 (  six) hours as needed for nausea or vomiting (or headache). 12 tablet 0  . rOPINIRole (REQUIP) 2 MG tablet     . sodium chloride 0.9 % SOLN 100 mL with valproate 500 MG/5ML SOLN 500 mg Inject 500 mg into the vein once as needed. To be infused over  a 15 minutes period, repeat once if partial relief.    . SUMAtriptan Succinate Refill 6 MG/0.5ML SOCT inject INTO THE SKIN AS NEEDED FOR MIGRAINE OR HEADACHE;  MAY REPEAT IN 2 HOURS IF HEADACHE PERSISTS OR RECURS 3 cartridge 5  . terbinafine (LAMISIL) 250 MG tablet     . zolpidem (AMBIEN) 10 MG tablet Take 10 mg by mouth at bedtime.     No current facility-administered medications on file  prior to visit.     Past Surgical History:  Procedure Laterality Date  . ANKLE SURGERY  12/88   left   . chest nodule  1990?   rt chest wall nodule removal  . GANGLION CYST EXCISION  2011  . lipoma removal    . right bunioectomy    . SHOULDER SURGERY  01/13/2011   right, partial tear  . tumor resection left thigh      Allergies  Allergen Reactions  . Adhesive [Tape] Itching and Rash    Also reacted to Steri Strips and Band-Aids.  . Dilaudid [Hydromorphone Hcl] Itching  . Morphine Nausea And Vomiting  . Penicillins Hives    Has patient had a PCN reaction causing immediate rash, facial/tongue/throat swelling, SOB or lightheadedness with hypotension: YES Has patient had a PCN reaction causing severe rash involving mucus membranes or skin necrosis: NO Has patient had a PCN reaction that required hospitalization NO Has patient had a PCN reaction occurring within the last 10 years:NO If all of the above answers are "NO", then may proceed with Cephalosporin use.  Marland Kitchen Percocet [Oxycodone-Acetaminophen] Itching  . Prednisone Hives  . Provera [Medroxyprogesterone Acetate] Other (See Comments)    Causes manic episodes  . Ultram [Tramadol Hcl] Itching    Social History   Social History  . Marital status: Single    Spouse name: N/A  . Number of children: 0  . Years of education: N/A   Occupational History  .  [employer]   Social History Main Topics  . Smoking status: Never Smoker  . Smokeless tobacco: Never Used  . Alcohol use No  . Drug use: No  . Sexual activity: No   Other Topics Concern  . Not on file   Social History Narrative   Caffeine  2 sodas daily, 1 cup coffee daily.    Family History  Problem Relation Age of Onset  . Hypertension Mother   . Hyperlipidemia Mother   . Heart attack Father   . Heart disease Father   . Hypertension Father   . Bipolar disorder Father   . Diabetes Paternal Grandfather   . Heart disease Maternal Aunt   . Breast cancer Maternal  Aunt   . Heart disease Maternal Grandmother   . Cancer Maternal Grandmother     colon    BP 121/78   Pulse (!) 57   Ht 5\' 10"  (1.778 m)   Wt 255 lb (115.7 kg)   BMI 36.59 kg/m   Review of Systems: See HPI above.    Objective:  Physical Exam:  Gen: NAD, comfortable in exam room  Exam not repeated today except to note tenderness 1st dorsal compartment.  Left thumb: No gross deformity, swelling, bruising. TTP  1st dorsal compartment now.  No MCP, CMC, other tenderness about hand or thumb. FROM at IP, MCP, CMC joints.  Pain on resisted thumb flexion and extension. Positive finkelsteins Negative tinels NVI distally.    Assessment & Plan:  1. Left thumb pain - consistent with dequervains tenosynovitis.  Injection given today - did not notice improvement with prednisone orally.  Continue thumb spica brace.  Icing, meloxicam.  After informed written consent patient was seated in exam room.  Area overlying left 1st dorsal compartment prepped with alcohol swab then injected with 0.5:0.54mL bupivicaine: depomedrol.  Patient tolerated procedure well without immediate complications.

## 2016-03-28 NOTE — Assessment & Plan Note (Signed)
consistent with dequervains tenosynovitis.  Injection given today - did not notice improvement with prednisone orally.  Continue thumb spica brace.  Icing, meloxicam.  After informed written consent patient was seated in exam room.  Area overlying left 1st dorsal compartment prepped with alcohol swab then injected with 0.5:0.51mL bupivicaine: depomedrol.  Patient tolerated procedure well without immediate complications.

## 2016-04-22 ENCOUNTER — Encounter (HOSPITAL_COMMUNITY): Payer: Self-pay | Admitting: Emergency Medicine

## 2016-04-22 ENCOUNTER — Ambulatory Visit (HOSPITAL_COMMUNITY)
Admission: EM | Admit: 2016-04-22 | Discharge: 2016-04-22 | Disposition: A | Discharge status: Home / Self Care | Payer: Worker's Compensation | Attending: Radiology | Admitting: Radiology

## 2016-04-22 DIAGNOSIS — W19XXXA Unspecified fall, initial encounter: Secondary | ICD-10-CM

## 2016-04-22 DIAGNOSIS — M25551 Pain in right hip: Secondary | ICD-10-CM

## 2016-04-22 DIAGNOSIS — W108XXA Fall (on) (from) other stairs and steps, initial encounter: Secondary | ICD-10-CM | POA: Diagnosis not present

## 2016-04-22 DIAGNOSIS — M25531 Pain in right wrist: Secondary | ICD-10-CM | POA: Diagnosis not present

## 2016-04-22 DIAGNOSIS — M25571 Pain in right ankle and joints of right foot: Secondary | ICD-10-CM | POA: Diagnosis not present

## 2016-04-22 MED ORDER — CYCLOBENZAPRINE HCL 10 MG PO TABS: 10.0000 mg | ORAL_TABLET | Freq: Two times a day (BID) | ORAL | 0 refills | Status: DC | PRN

## 2016-04-22 MED ORDER — DICLOFENAC SODIUM 75 MG PO TBEC: 75.0000 mg | DELAYED_RELEASE_TABLET | Freq: Two times a day (BID) | ORAL | 0 refills | Status: AC

## 2016-04-22 NOTE — ED Provider Notes (Signed)
CSN: [id number]     Arrival date & time 04/22/16  1859 History   None    Chief Complaint  Patient presents with  . Fall   (Consider location/radiation/quality/duration/timing/severity/associated sxs/prior Treatment) 46 y.o. female presents with injuries that occurred at work today at 18:00. Patient states that she was attempted keep a door open with her foot when she rolled her  Right ankle and fell onto cement stairs injuring her right hip. Patient states that she attempted to break her fall and as a result injured her right wrist. Patient describes that wrist pain as "aching" and the hip pain as "throbbing. Patient is able to put weight on her right leg and has full rom of all extremities. Condition is acute in nature. Condition is made better by nothingg. Condition is made worse when standing at work. Patient denies any treatment prior to there arrival at this facility.        Past Medical History:  Diagnosis Date  . Achilles tendinitis   . Achilles tendinitis   . ADHD (attention deficit hyperactivity disorder)   . Allergy   . Arthritis   . Bipolar affective ([place])   . Bipolar disorder ([place])   . Eczema   . Ganglion cyst 09/29/2009   left wrist (2 cyst)  . Hyperprolactinemia ([place])   . Hypertension   . Lipoma   . Migraine   . Personality disorder   . Pes planus    Past Surgical History:  Procedure Laterality Date  . ANKLE SURGERY  12/88   left   . chest nodule  1990?   rt chest wall nodule removal  . GANGLION CYST EXCISION  2011  . lipoma removal    . right bunioectomy    . SHOULDER SURGERY  01/13/2011   right, partial tear  . tumor resection left thigh     Family History  Problem Relation Age of Onset  . Hypertension Mother   . Hyperlipidemia Mother   . Heart attack Father   . Heart disease Father   . Hypertension Father   . Bipolar disorder Father   . Diabetes Paternal Grandfather   . Heart disease Maternal Aunt   . Breast cancer Maternal Aunt   . Heart  disease Maternal Grandmother   . Cancer Maternal Grandmother     colon   Social History  Substance Use Topics  . Smoking status: Never Smoker  . Smokeless tobacco: Never Used  . Alcohol use No   OB History    No data available     Review of Systems  Constitutional: Negative for chills and fever.  HENT: Negative for ear pain and sore throat.   Eyes: Negative for pain and visual disturbance.  Respiratory: Negative for cough and shortness of breath.   Cardiovascular: Negative for chest pain and palpitations.  Gastrointestinal: Negative for abdominal pain and vomiting.  Genitourinary: Negative for dysuria and hematuria.  Musculoskeletal: Negative for arthralgias and back pain.       Pain to right wrist, right hip and right ankle  Skin: Negative for color change and rash.  Neurological: Negative for seizures and syncope.  All other systems reviewed and are negative.   Allergies  Adhesive [tape]; Dilaudid [hydromorphone hcl]; Morphine; Penicillins; Percocet [oxycodone-acetaminophen]; Prednisone; Provera [medroxyprogesterone acetate]; and Ultram [tramadol hcl]  Home Medications   Prior to Admission medications   Medication Sig Start Date End Date Taking? Authorizing Provider  azelastine (ASTELIN) 0.1 % nasal spray instill 1 to 2 sprays into each nostril  twice a day 10/21/14   Historical Provider, MD  cyclobenzaprine (FLEXERIL) 10 MG tablet Take 1 tablet (10 mg total) by mouth 2 (two) times daily as needed for muscle spasms. 04/22/16   Jacqualine Mau, NP  diclofenac (VOLTAREN) 75 MG EC tablet Take 1 tablet (75 mg total) by mouth 2 (two) times daily. 04/22/16 04/27/16  Jacqualine Mau, NP  divalproex (DEPAKOTE ER) 500 MG 24 hr tablet take 3 tablets by mouth at bedtime 02/08/16   Larey Seat, MD  hydrochlorothiazide (HYDRODIURIL) 25 MG tablet Take 1 tablet (25 mg total) by mouth daily. For blood pressure control. Patient not taking: Reported on 11/16/2015 09/28/14   Encarnacion Slates, NP  hydrocortisone cream 0.5 % Apply 1 application topically daily as needed for itching.    Historical Provider, MD  hydrOXYzine (VISTARIL) 50 MG capsule take 1 capsule by mouth twice a day and 1 capsule at bedtime 03/11/15   Historical Provider, MD  ibuprofen (ADVIL,MOTRIN) 800 MG tablet Take 1 tablet (800 mg total) by mouth every 8 (eight) hours as needed. 12/10/14   Dene Gentry, MD  lamoTRIgine (LAMICTAL) 200 MG tablet Take 1 tablet (200 mg total) by mouth at bedtime. For mood stabilization 09/28/14   Encarnacion Slates, NP  Lurasidone HCl 120 MG TABS Take 1 tablet by mouth every evening.    Historical Provider, MD  meloxicam (MOBIC) 15 MG tablet Take 1 tablet (15 mg total) by mouth daily. 03/22/16   Dene Gentry, MD  metoprolol succinate (TOPROL-XL) 100 MG 24 hr tablet Take 1 tablet (100 mg total) by mouth daily. Take with or immediately following a meal for blood pressure control. 09/28/14   Encarnacion Slates, NP  Multiple Vitamin (MULTIVITAMIN WITH MINERALS) TABS tablet Take 1 tablet by mouth daily. For nutritional supplementation. 09/28/14   Encarnacion Slates, NP  ondansetron (ZOFRAN) 4 MG tablet Take 1 tablet (4 mg total) by mouth every 8 (eight) hours as needed for nausea or vomiting. 08/18/15   Larey Seat, MD  Polyvinyl Alcohol-Povidone (REFRESH OP) Apply 1 tablet to eye daily as needed. For dry eyes    Historical Provider, MD  promethazine (PHENERGAN) 25 MG tablet Take 1 tablet (25 mg total) by mouth every 6 (six) hours as needed for nausea or vomiting (or headache). 11/17/15   Duffy Bruce, MD  rOPINIRole (REQUIP) 2 MG tablet  02/24/16   Historical Provider, MD  sodium chloride 0.9 % SOLN 100 mL with valproate 500 MG/5ML SOLN 500 mg Inject 500 mg into the vein once as needed. To be infused over  a 15 minutes period, repeat once if partial relief. 12/02/14   Asencion Partridge Dohmeier, MD  SUMAtriptan Succinate Refill 6 MG/0.5ML SOCT inject INTO THE SKIN AS NEEDED FOR MIGRAINE OR HEADACHE;  MAY REPEAT  IN 2 HOURS IF HEADACHE PERSISTS OR RECURS 08/19/15   Larey Seat, MD  terbinafine (LAMISIL) 250 MG tablet  02/13/16   Historical Provider, MD  zolpidem (AMBIEN) 10 MG tablet Take 10 mg by mouth at bedtime.    Historical Provider, MD   Meds Ordered and Administered this Visit  Medications - No data to display  BP 116/74 (BP Location: Left Arm)   Pulse (!) 55   Temp 97.2 F (36.2 C) (Oral)   Resp 20   LMP 04/14/2016   SpO2 100%  No data found.   Physical Exam  Constitutional: She is oriented to person, place, and time. She appears well-developed and well-nourished.  HENT:  Head: Normocephalic and atraumatic.  Eyes: Conjunctivae are normal.  Neck: Normal range of motion.  Pulmonary/Chest: Effort normal.  Neurological: She is alert and oriented to person, place, and time.  Psychiatric: She has a normal mood and affect.  Nursing note and vitals reviewed.   Urgent Care Course     Procedures (including critical care time)  Labs Review Labs Reviewed - No data to display  Imaging Review No results found.   Visual Acuity Review  Right Eye Distance:   Left Eye Distance:   Bilateral Distance:    Right Eye Near:   Left Eye Near:    Bilateral Near:         MDM   1. Fall, initial encounter        Jacqualine Mau, NP 04/22/16 2104

## 2016-04-22 NOTE — ED Triage Notes (Signed)
Patient reports this is a workers comp injury.  Patient was going in a heavy door: rolled right ankle, fell on right hip and right wrist.

## 2016-06-06 DIAGNOSIS — M6780 Other specified disorders of synovium and tendon, unspecified site: Secondary | ICD-10-CM | POA: Diagnosis not present

## 2016-06-21 ENCOUNTER — Encounter: Payer: Self-pay | Admitting: Family Medicine

## 2016-06-21 ENCOUNTER — Ambulatory Visit (HOSPITAL_BASED_OUTPATIENT_CLINIC_OR_DEPARTMENT_OTHER)
Admission: RE | Admit: 2016-06-21 | Discharge: 2016-06-21 | Disposition: A | Discharge status: Home / Self Care | Payer: PPO | Source: Ambulatory Visit | Attending: Family Medicine | Admitting: Family Medicine

## 2016-06-21 ENCOUNTER — Ambulatory Visit (INDEPENDENT_AMBULATORY_CARE_PROVIDER_SITE_OTHER): Payer: PPO | Admitting: Family Medicine

## 2016-06-21 VITALS — BP 114/83 | HR 82 | Ht 70.0 in | Wt 262.0 lb

## 2016-06-21 DIAGNOSIS — X58XXXA Exposure to other specified factors, initial encounter: Secondary | ICD-10-CM | POA: Diagnosis not present

## 2016-06-21 DIAGNOSIS — M25532 Pain in left wrist: Secondary | ICD-10-CM | POA: Diagnosis not present

## 2016-06-21 DIAGNOSIS — S6992XA Unspecified injury of left wrist, hand and finger(s), initial encounter: Secondary | ICD-10-CM | POA: Diagnosis not present

## 2016-06-21 DIAGNOSIS — M5441 Lumbago with sciatica, right side: Secondary | ICD-10-CM | POA: Diagnosis not present

## 2016-06-21 MED ORDER — TRAMADOL HCL 50 MG PO TABS: 50.0000 mg | ORAL_TABLET | Freq: Four times a day (QID) | ORAL | 0 refills | Status: DC | PRN

## 2016-06-21 MED ORDER — MELOXICAM 15 MG PO TABS: 15.0000 mg | ORAL_TABLET | Freq: Every day | ORAL | 2 refills | Status: DC

## 2016-06-21 MED ORDER — METAXALONE 800 MG PO TABS: 800.0000 mg | ORAL_TABLET | Freq: Three times a day (TID) | ORAL | 1 refills | Status: DC | PRN

## 2016-06-21 NOTE — Patient Instructions (Signed)
Your x-rays were negative for a fracture. Wear the thumb spica brace on this wrist for 2 weeks then follow up with me at that time. Your back pain is due to muscle strains and contusion. Meloxicam 15mg  daily with food for pain and inflammation. Skelaxin as needed for muscle spasms. Tramadol as needed for severe pain - no driving on this medicine.  You have piriformis syndrome Try to avoid painful activities when possible. Tennis ball to massage area when sitting Pick 2-3 stretches where you feel the pull in the area of pain - do 3 of these and hold for 20-30 seconds at least once a day

## 2016-06-22 ENCOUNTER — Encounter: Payer: Self-pay | Admitting: Neurology

## 2016-06-22 ENCOUNTER — Ambulatory Visit (INDEPENDENT_AMBULATORY_CARE_PROVIDER_SITE_OTHER): Payer: PPO | Admitting: Neurology

## 2016-06-22 DIAGNOSIS — G43011 Migraine without aura, intractable, with status migrainosus: Secondary | ICD-10-CM

## 2016-06-22 MED ORDER — VALPROATE SODIUM 500 MG/5ML IV SOLN: 500.0000 mg | Freq: Once | INTRAVENOUS | 1 refills | Status: DC | PRN

## 2016-06-22 MED ORDER — SUMATRIPTAN SUCCINATE 6 MG/0.5ML ~~LOC~~ SOAJ: 6.0000 mg | SUBCUTANEOUS | 5 refills | Status: DC | PRN

## 2016-06-22 MED ORDER — ONDANSETRON HCL 4 MG PO TABS: 4.0000 mg | ORAL_TABLET | Freq: Three times a day (TID) | ORAL | 0 refills | Status: DC | PRN

## 2016-06-22 NOTE — Progress Notes (Signed)
PATIENT: [name] DOB: 11/09/1970  REASON FOR VISIT: follow up- migraine headaches HISTORY FROM: patient  HISTORY OF PRESENT ILLNESS: Interval history from 06/22/2016, I see Cornell Gaber today, she has developed tendinitis in her left wrist, and x-ray confirmed yesterday that there is no bony injury. The tendinitis is painful and keeps her from exercising. She continued slowly to gain weight since her exercise regimen has been limited. She continues to work gainfully employed, she still has some migraines- had another flurry with nausea and drowsiness, but her sleep has improved, she is here for routine revisit. Today  Level 7- out of 10 with nausea. Her mother brought her as she did not trust her driving. Will give depakene today and a small dose of Steroids.    HISTORY per Dr. Edwena Felty notes: Nicki Furlan Pinkham is  seen here as a referral  from Dr. Caprice Beaver for a sleep consultation. Jhana Adrian has been followed in this office for migraine headaches, originally Dr. Erling Cruz-  but is today referred for a sleep consultation. She endorsed the Epworth sleepiness score between 13 and 15 points during her last visit with a psychiatrist. She also reports more myoclonic appearing jerks, both lower extremities with jump at the same time when she is just ready to fall asleep and prolonged her sleep latency thereby.\ Chief complaint according to patient : "I fall asleep all the time. ";  " I have more migraines- they split right in the middle , affect my left face ".   Sleep habits are as follows:The patient reports that she does not have any screen light or lights penetrating her bedroom which is described as cool, quiet and dark. She sleeps alone in the bedroom. She usually goes to bed around 11:30 and 12 PM,  it will take her about 30 minutes to fall asleep. She has noticed that both legs seem to get jumpy by the time she feels ready to enter sleep. She finds herself them again unable to fall  asleep. These may not to be restless legs ;she does not describe creepy crawly sensations, rather myoclonic jerks. She has fallen out of bed several times !  sometimes in relation to visit nightmarish dreams being chased being under threat fearing for her life. These visit dreams are very detailed areas she has installed bed rails to prevent falling. She does usually not sleep walk. Mother has not witnessed any sleep talking either, but yelling. She lives in the same home as her mother, the bedrooms are across the hall from each other. Her mother has sometimes woken her when she seemed to have a nightmare and may have yelled or acted out. Nightmares seem to occur within about 2 hours of sleep initiation. Usually only once per night. They don't occur every night either but perhaps 4 or 5 times a month. She states that she scrambles to switch the light on and that that gives her certainty that her dream is not reality. She has placed a flashlight on her nightstand for that reason.She has woken up finding that the nightstand had been moved or  she finds herself on the floor. If she has no appointments to keep in the morning she rises by about 9 AM, she relies on an alarm and she often hits the snooze button repeatedly. Most of the time she is neither refreshed nor restored in the morning. Some morning she will wake up with a dull headache throbbing, this may lift as the day goes on.  Other nights she will wake up from a sharp stabbing headache that has a attack character. These cluster headaches usually occur more towards the morning hours but do not occur usually was in the first hour of sleep. She treats these with Imitrex. She will try to sleep these headaches off.  Social history: The patient will have a caffeine intake of about 2 sodas a day plus iced teas, rarely coffee. She will drink tea also for lunch or dinner. Non smoker, non alcohol drinker. Single , graduate of a local college. Hypersomnia with  extreme fatigue and depression. She is finally gainfully employed. Mother has insomnia, has trouble falling asleep and staying asleep. Father sleep habits are not known. Brother seems not to snore or have apnea.  02-17-15,Shatori is here today for her revisit after sleep study. Her PSG revealed only mild obstructive sleep apnea the overall AHI was 8.3 exacerbated during REM sleep during REM sleep her AHI was 21.8. She had about 23% of the night spent in rem sleep which is highly unusual for patient on multiple psychotropic medications and antidepressants. She has reported nightmares and vivid dreams and there was some sleep related talking and even at one time yelling. I did not find a sleep correlation to her morning headaches and episodic nocturnal cluster headaches. The high degree of sleepiness and fatigue remains unexplained. I would offer her to treat this mild degree of apnea given her comorbidities.The patient slept rather well after 1:30 AM but still had 3 brief periods of wakefulness. Again the use of CPAP would be optional in her case but my concern is that it would make her insomnia worse. She slept all night supine.  Interval history from 05/31/2015. Clydell underwent a second cataract surgery. Her eyes are doing well, there were no complications no infections etc. She has developed a left temporal headache and is here today to see if she can get some Depakote infusion. I will also write for Indocin given that she has hours of throbbing left-sided headaches associated with some nausea.  Interval history from 08/18/2015. Ms. [name] is here today with a headache of a duration of 6 days. Since she has had relief with Depakote infusions I will order this for her today. I may also use a low-dose Solu-Medrol in the mix. I will also order some Zofran to be given to help with the nausea.  REVIEW OF SYSTEMS: Out of a complete 14 system review of symptoms, the patient complains only of the following  symptoms, and all other reviewed systems are negative. Tendonitis in left wrist- neck stiffness. Reports dizziness with head movement - when trying to get merchandise form a higher shelf.  dizziness, headache,  No longer Urinary urgency,  Nausea with Headaches , restless leg, insomnia,  depression, eye pain, photophobia.   ALLERGIES: Allergies  Allergen Reactions  . Adhesive [Tape] Itching and Rash    Also reacted to Steri Strips and Band-Aids.  . Dilaudid [Hydromorphone Hcl] Itching  . Morphine Nausea And Vomiting  . Penicillins Hives    Has patient had a PCN reaction causing immediate rash, facial/tongue/throat swelling, SOB or lightheadedness with hypotension: YES Has patient had a PCN reaction causing severe rash involving mucus membranes or skin necrosis: NO Has patient had a PCN reaction that required hospitalization NO Has patient had a PCN reaction occurring within the last 10 years:NO If all of the above answers are "NO", then may proceed with Cephalosporin use.  Marland Kitchen Percocet [Oxycodone-Acetaminophen] Itching  . Prednisone Hives  .  Provera [Medroxyprogesterone Acetate] Other (See Comments)    Causes manic episodes  . Ultram [Tramadol Hcl] Itching    HOME MEDICATIONS: Outpatient Medications Prior to Visit  Medication Sig Dispense Refill  . azelastine (ASTELIN) 0.1 % nasal spray instill 1 to 2 sprays into each nostril twice a day  0  . divalproex (DEPAKOTE ER) 500 MG 24 hr tablet take 3 tablets by mouth at bedtime 90 tablet 1  . hydrocortisone cream 0.5 % Apply 1 application topically daily as needed for itching.    . hydrOXYzine (VISTARIL) 50 MG capsule take 1 capsule by mouth twice a day and 1 capsule at bedtime  0  . lamoTRIgine (LAMICTAL) 200 MG tablet Take 1 tablet (200 mg total) by mouth at bedtime. For mood stabilization (Patient taking differently: Take 300 mg by mouth at bedtime. For mood stabilization) 30 tablet 0  . Lurasidone HCl 120 MG TABS Take 1 tablet by mouth  every evening.    . meloxicam (MOBIC) 15 MG tablet Take 1 tablet (15 mg total) by mouth daily. 30 tablet 2  . metaxalone (SKELAXIN) 800 MG tablet Take 1 tablet (800 mg total) by mouth 3 (three) times daily as needed for muscle spasms. 60 tablet 1  . metoprolol succinate (TOPROL-XL) 100 MG 24 hr tablet Take 1 tablet (100 mg total) by mouth daily. Take with or immediately following a meal for blood pressure control. 30 tablet 0  . Multiple Vitamin (MULTIVITAMIN WITH MINERALS) TABS tablet Take 1 tablet by mouth daily. For nutritional supplementation. 30 tablet 0  . ondansetron (ZOFRAN) 4 MG tablet Take 1 tablet (4 mg total) by mouth every 8 (eight) hours as needed for nausea or vomiting. 20 tablet 0  . Polyvinyl Alcohol-Povidone (REFRESH OP) Apply 1 tablet to eye daily as needed. For dry eyes    . promethazine (PHENERGAN) 25 MG tablet Take 1 tablet (25 mg total) by mouth every 6 (six) hours as needed for nausea or vomiting (or headache). 12 tablet 0  . rOPINIRole (REQUIP) 2 MG tablet     . sodium chloride 0.9 % SOLN 100 mL with valproate 500 MG/5ML SOLN 500 mg Inject 500 mg into the vein once as needed. To be infused over  a 15 minutes period, repeat once if partial relief.    . SUMAtriptan Succinate Refill 6 MG/0.5ML SOCT inject INTO THE SKIN AS NEEDED FOR MIGRAINE OR HEADACHE;  MAY REPEAT IN 2 HOURS IF HEADACHE PERSISTS OR RECURS 3 cartridge 5  . terbinafine (LAMISIL) 250 MG tablet     . zolpidem (AMBIEN) 10 MG tablet Take 10 mg by mouth at bedtime.    . hydrochlorothiazide (HYDRODIURIL) 25 MG tablet Take 1 tablet (25 mg total) by mouth daily. For blood pressure control. (Patient not taking: Reported on 11/16/2015) 30 tablet 0  . ibuprofen (ADVIL,MOTRIN) 800 MG tablet Take 1 tablet (800 mg total) by mouth every 8 (eight) hours as needed. 90 tablet 0  . traMADol (ULTRAM) 50 MG tablet Take 1 tablet (50 mg total) by mouth every 6 (six) hours as needed. 20 tablet 0   No facility-administered medications  prior to visit.     PAST MEDICAL HISTORY: Past Medical History:  Diagnosis Date  . Achilles tendinitis   . Achilles tendinitis   . ADHD (attention deficit hyperactivity disorder)   . Allergy   . Arthritis   . Bipolar affective ([place])   . Bipolar disorder ([place])   . Eczema   . Ganglion cyst 09/29/2009  left wrist (2 cyst)  . Hyperprolactinemia ([place])   . Hypertension   . Lipoma   . Migraine   . Personality disorder   . Pes planus     PAST SURGICAL HISTORY: Past Surgical History:  Procedure Laterality Date  . ANKLE SURGERY  12/88   left   . chest nodule  1990?   rt chest wall nodule removal  . GANGLION CYST EXCISION  2011  . lipoma removal    . right bunioectomy    . SHOULDER SURGERY  01/13/2011   right, partial tear  . tumor resection left thigh      FAMILY HISTORY: Family History  Problem Relation Age of Onset  . Hypertension Mother   . Hyperlipidemia Mother   . Heart attack Father   . Heart disease Father   . Hypertension Father   . Bipolar disorder Father   . Diabetes Paternal Grandfather   . Heart disease Maternal Aunt   . Breast cancer Maternal Aunt   . Heart disease Maternal Grandmother   . Cancer Maternal Grandmother        colon    SOCIAL HISTORY: Social History   Social History  . Marital status: Single    Spouse name: N/A  . Number of children: 0  . Years of education: N/A   Occupational History  .  [employer]   Social History Main Topics  . Smoking status: Never Smoker  . Smokeless tobacco: Never Used  . Alcohol use No  . Drug use: No  . Sexual activity: No   Other Topics Concern  . Not on file   Social History Narrative   Caffeine  2 sodas daily, 1 cup coffee daily.      PHYSICAL EXAM  Vitals:   06/22/16 1042  BP: (!) 148/83  Pulse: 66  Weight: 262 lb (118.8 kg)  Height: 5\' 10"  (1.778 m)   Body mass index is 37.59 kg/m.  Generalized: Well developed, in no acute distress    Neurological examination  Mentation: Alert  oriented to time, place, history taking.  Follows all commands - With speech and language fluent Cranial nerve:  Hypersensitive to odors and smells- trigger of migraines.her  Taste sense is affected- food tastes bland.  Pupils were equal round reactive to light. Extraocular movements were full, visual field were full on confrontational test. Facial sensation and strength were normal. Uvula tongue midline. Head turning and shoulder shrug  were normal and symmetric. Motor: 5 over 5 strength of all 4 extremities. Good symmetric motor tone is noted throughout.  Left wrist in a brace.  Sensory: intact to soft touch on all 4 extremities. Coordination: Cerebellar testing reveals good finger-nose-finger and heel-to-shin bilaterally.  Gait and station: Gait is normal. Tandem gait is unsteady. Romberg is negative. No drift is seen. - same as always.  Reflexes: Deep tendon reflexes are symmetricbilaterally.   DIAGNOSTIC DATA (LABS, IMAGING, TESTING) - I reviewed patient records, labs, notes, testing and imaging myself where available.  Lab Results  Component Value Date   WBC 5.4 02/21/2016   HGB 13.9 09/24/2014   HCT 42.7 02/21/2016   MCV 97 02/21/2016   PLT 198 02/21/2016      Component Value Date/Time   NA 143 02/21/2016 1017   K 4.5 02/21/2016 1017   CL 102 02/21/2016 1017   CO2 27 02/21/2016 1017   GLUCOSE 85 02/21/2016 1017   GLUCOSE 91 09/25/2014 0615   BUN 10 02/21/2016 1017   CREATININE 1.03 (H) 02/21/2016  1017   CREATININE 0.83 09/11/2011 1524   CALCIUM 9.5 02/21/2016 1017   PROT 5.6 (L) 02/21/2016 1017   ALBUMIN 3.9 02/21/2016 1017   AST 16 02/21/2016 1017   ALT 12 02/21/2016 1017   ALKPHOS 69 02/21/2016 1017   BILITOT 0.5 02/21/2016 1017   GFRNONAA 65 02/21/2016 1017   GFRAA 75 02/21/2016 1017   Lab Results  Component Value Date   CHOL 185 09/25/2014   HDL 55 09/25/2014   LDLCALC 96 09/25/2014   TRIG 171 (H) 09/25/2014   CHOLHDL 3.4 09/25/2014   Lab Results    Component Value Date   HGBA1C 5.4 09/25/2014   No results found for: [id number] Lab Results  Component Value Date   TSH 1.277 04/14/2014      ASSESSMENT AND PLAN 46 y.o. year old female  has a past medical history of Achilles tendinitis; Achilles tendinitis; ADHD (attention deficit hyperactivity disorder); Allergy; Arthritis; Bipolar affective ([place]); Bipolar disorder ([place]); Eczema; Ganglion cyst (09/29/2009); Hyperprolactinemia ([place]); Hypertension; Lipoma; Migraine; Personality disorder; and Pes planus. here with:  1. Migraine headaches- with nausea and followed by profound lack of energy" wipes me out "  2. Bipolar Disorder - [facility] follows  Give Iv depakene today with 20 mg of steroids.  The patient will continue on Depakote. Refilled - Triptan, too. Patient is interested in the new migraine treatment options with  Preventive protein receptor binding  I will check CMET and CBC , sed rate  today.  Patient advised that if her headache frequency or severity worsens she should let us know. She is always encouraged to continue to follow-up with her psychiatrist for treatment of bipolar disorder and depression.    06/22/2016, 10:59 AM [facility] [address] [place], [place] [address] [phone]

## 2016-06-22 NOTE — Patient Instructions (Signed)

## 2016-06-27 DIAGNOSIS — M545 Low back pain, unspecified: Secondary | ICD-10-CM | POA: Insufficient documentation

## 2016-06-27 DIAGNOSIS — S6992XD Unspecified injury of left wrist, hand and finger(s), subsequent encounter: Secondary | ICD-10-CM | POA: Insufficient documentation

## 2016-06-27 NOTE — Progress Notes (Signed)
PCP: Aretta Nip, MD  Subjective:   HPI: Patient is a 46 y.o. female here for back, left wrist pain.  Patient reports on 5/22 she was getting into her car - had the door open and as she was getting in she fell backwards and twisted her back, reached out left hand to brace herself. Pain right now is 8/10 level, sharp and on right side mostly low back/buttocks but radiates up as well and down to knee. No numbness or tingling. Feels a catch sensation in right buttock at times. No skin changes. Wrist pain is dorsal mainly.  Past Medical History:  Diagnosis Date  . Achilles tendinitis   . Achilles tendinitis   . ADHD (attention deficit hyperactivity disorder)   . Allergy   . Arthritis   . Bipolar affective ([place])   . Bipolar disorder ([place])   . Eczema   . Ganglion cyst 09/29/2009   left wrist (2 cyst)  . Hyperprolactinemia ([place])   . Hypertension   . Lipoma   . Migraine   . Personality disorder   . Pes planus     Current Outpatient Prescriptions on File Prior to Visit  Medication Sig Dispense Refill  . azelastine (ASTELIN) 0.1 % nasal spray instill 1 to 2 sprays into each nostril twice a day  0  . divalproex (DEPAKOTE ER) 500 MG 24 hr tablet take 3 tablets by mouth at bedtime 90 tablet 1  . hydrocortisone cream 0.5 % Apply 1 application topically daily as needed for itching.    . hydrOXYzine (VISTARIL) 50 MG capsule take 1 capsule by mouth twice a day and 1 capsule at bedtime  0  . lamoTRIgine (LAMICTAL) 200 MG tablet Take 1 tablet (200 mg total) by mouth at bedtime. For mood stabilization (Patient taking differently: Take 300 mg by mouth at bedtime. For mood stabilization) 30 tablet 0  . Lurasidone HCl 120 MG TABS Take 1 tablet by mouth every evening.    . metoprolol succinate (TOPROL-XL) 100 MG 24 hr tablet Take 1 tablet (100 mg total) by mouth daily. Take with or immediately following a meal for blood pressure control. 30 tablet 0  . Multiple Vitamin (MULTIVITAMIN WITH  MINERALS) TABS tablet Take 1 tablet by mouth daily. For nutritional supplementation. 30 tablet 0  . Polyvinyl Alcohol-Povidone (REFRESH OP) Apply 1 tablet to eye daily as needed. For dry eyes    . promethazine (PHENERGAN) 25 MG tablet Take 1 tablet (25 mg total) by mouth every 6 (six) hours as needed for nausea or vomiting (or headache). 12 tablet 0  . rOPINIRole (REQUIP) 2 MG tablet     . sodium chloride 0.9 % SOLN 100 mL with valproate 500 MG/5ML SOLN 500 mg Inject 500 mg into the vein once as needed. To be infused over  a 15 minutes period, repeat once if partial relief.    . terbinafine (LAMISIL) 250 MG tablet     . zolpidem (AMBIEN) 10 MG tablet Take 10 mg by mouth at bedtime.     No current facility-administered medications on file prior to visit.     Past Surgical History:  Procedure Laterality Date  . ANKLE SURGERY  12/88   left   . chest nodule  1990?   rt chest wall nodule removal  . GANGLION CYST EXCISION  2011  . lipoma removal    . right bunioectomy    . SHOULDER SURGERY  01/13/2011   right, partial tear  . tumor resection left thigh  Allergies  Allergen Reactions  . Adhesive [Tape] Itching and Rash    Also reacted to Steri Strips and Band-Aids.  . Dilaudid [Hydromorphone Hcl] Itching  . Morphine Nausea And Vomiting  . Penicillins Hives    Has patient had a PCN reaction causing immediate rash, facial/tongue/throat swelling, SOB or lightheadedness with hypotension: YES Has patient had a PCN reaction causing severe rash involving mucus membranes or skin necrosis: NO Has patient had a PCN reaction that required hospitalization NO Has patient had a PCN reaction occurring within the last 10 years:NO If all of the above answers are "NO", then may proceed with Cephalosporin use.  Marland Kitchen Percocet [Oxycodone-Acetaminophen] Itching  . Prednisone Hives  . Provera [Medroxyprogesterone Acetate] Other (See Comments)    Causes manic episodes  . Ultram [Tramadol Hcl] Itching     Social History   Social History  . Marital status: Single    Spouse name: N/A  . Number of children: 0  . Years of education: N/A   Occupational History  .  [employer]   Social History Main Topics  . Smoking status: Never Smoker  . Smokeless tobacco: Never Used  . Alcohol use No  . Drug use: No  . Sexual activity: No   Other Topics Concern  . Not on file   Social History Narrative   Caffeine  2 sodas daily, 1 cup coffee daily.    Family History  Problem Relation Age of Onset  . Hypertension Mother   . Hyperlipidemia Mother   . Heart attack Father   . Heart disease Father   . Hypertension Father   . Bipolar disorder Father   . Diabetes Paternal Grandfather   . Heart disease Maternal Aunt   . Breast cancer Maternal Aunt   . Heart disease Maternal Grandmother   . Cancer Maternal Grandmother        colon    BP 114/83   Pulse 82   Ht 5\' 10"  (1.778 m)   Wt 262 lb (118.8 kg)   LMP 06/07/2016   BMI 37.59 kg/m   Review of Systems: See HPI above.     Objective:  Physical Exam:  Gen: NAD, comfortable in exam room  Back: No gross deformity, scoliosis. TTP right lumbar paraspinal region and over piriformis.  No midline or bony TTP. FROM with pain on flexion. Strength LEs 5/5 all muscle groups.   2+ MSRs in patellar and achilles tendons, equal bilaterally. Negative SLRs. Sensation intact to light touch bilaterally. Negative logroll bilateral hips Positive piriformis on right, negative left.  Left wrist/hand: No gross deformity, swelling, bruising. TTP distal radius, less over snuffbox.  No other tenderness. FROM digits without pain.  Pain on extension and flexion of wrist however. Strength 5/5 finger abduction, extension, thumb opposition. NVI distally.   Assessment & Plan:  1. Low back pain - 2/2 lumbar strain, contusion, and piriformis syndrome.  Meloxicam with skelaxin and tramadol as needed.  Shown home stretches to do daily.  Tennis ball massage for  piriformis.  F/u in 2 weeks.  2. Left wrist injury - independently reviewed radiographs and do not appreciate a fracture.  However given location of her tenderness will place in thumb spica brace, reevaluate in 2 weeks.  Meloxicam with skelaxin and tramadol as needed.

## 2016-06-27 NOTE — Assessment & Plan Note (Signed)
independently reviewed radiographs and do not appreciate a fracture.  However given location of her tenderness will place in thumb spica brace, reevaluate in 2 weeks.  Meloxicam with skelaxin and tramadol as needed.

## 2016-06-27 NOTE — Assessment & Plan Note (Signed)
2/2 lumbar strain, contusion, and piriformis syndrome.  Meloxicam with skelaxin and tramadol as needed.  Shown home stretches to do daily.  Tennis ball massage for piriformis.  F/u in 2 weeks.

## 2016-07-06 ENCOUNTER — Ambulatory Visit (INDEPENDENT_AMBULATORY_CARE_PROVIDER_SITE_OTHER): Payer: PPO | Admitting: Family Medicine

## 2016-07-06 ENCOUNTER — Ambulatory Visit (HOSPITAL_BASED_OUTPATIENT_CLINIC_OR_DEPARTMENT_OTHER)
Admission: RE | Admit: 2016-07-06 | Discharge: 2016-07-06 | Disposition: A | Discharge status: Home / Self Care | Payer: PPO | Source: Ambulatory Visit | Attending: Family Medicine | Admitting: Family Medicine

## 2016-07-06 ENCOUNTER — Encounter: Payer: Self-pay | Admitting: Family Medicine

## 2016-07-06 VITALS — BP 122/86 | HR 66 | Ht 70.0 in | Wt 267.0 lb

## 2016-07-06 DIAGNOSIS — M5441 Lumbago with sciatica, right side: Secondary | ICD-10-CM

## 2016-07-06 DIAGNOSIS — S6992XD Unspecified injury of left wrist, hand and finger(s), subsequent encounter: Secondary | ICD-10-CM

## 2016-07-06 DIAGNOSIS — M25532 Pain in left wrist: Secondary | ICD-10-CM

## 2016-07-06 DIAGNOSIS — S6992XA Unspecified injury of left wrist, hand and finger(s), initial encounter: Secondary | ICD-10-CM | POA: Diagnosis not present

## 2016-07-06 NOTE — Patient Instructions (Signed)
Your x-rays look great but can be normal with an occult scaphoid fracture which is where you are tender. We will go ahead with an MRI to further assess. Continue with the brace, advil in meantime. I will call you with results and next steps.

## 2016-07-06 NOTE — Progress Notes (Addendum)
PCP: Aretta Nip, MD  Subjective:   HPI: Patient is a 46 y.o. female here for back, left wrist pain.  5/23: Patient reports on 5/22 she was getting into her car - had the door open and as she was getting in she fell backwards and twisted her back, reached out left hand to brace herself. Pain right now is 8/10 level, sharp and on right side mostly low back/buttocks but radiates up as well and down to knee. No numbness or tingling. Feels a catch sensation in right buttock at times. No skin changes. Wrist pain is dorsal mainly.  6/7: Patient returns with persistent left wrist pain. She states pain is 5/10 but up to 8/10 and sharp. More on radial aspect of wrist. Is wearing thumb spica brace regularly. Taking advil, elevating. + swelling. Difficulty holding things because of twinges of pain here. No skin changes, numbness.  Past Medical History:  Diagnosis Date  . Achilles tendinitis   . Achilles tendinitis   . ADHD (attention deficit hyperactivity disorder)   . Allergy   . Arthritis   . Bipolar affective ([place])   . Bipolar disorder ([place])   . Eczema   . Ganglion cyst 09/29/2009   left wrist (2 cyst)  . Hyperprolactinemia ([place])   . Hypertension   . Lipoma   . Migraine   . Personality disorder   . Pes planus     Current Outpatient Prescriptions on File Prior to Visit  Medication Sig Dispense Refill  . azelastine (ASTELIN) 0.1 % nasal spray instill 1 to 2 sprays into each nostril twice a day  0  . divalproex (DEPAKOTE ER) 500 MG 24 hr tablet take 3 tablets by mouth at bedtime 90 tablet 1  . hydrocortisone cream 0.5 % Apply 1 application topically daily as needed for itching.    . hydrOXYzine (VISTARIL) 50 MG capsule take 1 capsule by mouth twice a day and 1 capsule at bedtime  0  . lamoTRIgine (LAMICTAL) 200 MG tablet Take 1 tablet (200 mg total) by mouth at bedtime. For mood stabilization (Patient taking differently: Take 300 mg by mouth at bedtime. For mood  stabilization) 30 tablet 0  . Lurasidone HCl 120 MG TABS Take 1 tablet by mouth every evening.    . meloxicam (MOBIC) 15 MG tablet Take 1 tablet (15 mg total) by mouth daily. 30 tablet 2  . metaxalone (SKELAXIN) 800 MG tablet Take 1 tablet (800 mg total) by mouth 3 (three) times daily as needed for muscle spasms. 60 tablet 1  . metoprolol succinate (TOPROL-XL) 100 MG 24 hr tablet Take 1 tablet (100 mg total) by mouth daily. Take with or immediately following a meal for blood pressure control. 30 tablet 0  . Multiple Vitamin (MULTIVITAMIN WITH MINERALS) TABS tablet Take 1 tablet by mouth daily. For nutritional supplementation. 30 tablet 0  . ondansetron (ZOFRAN) 4 MG tablet Take 1 tablet (4 mg total) by mouth every 8 (eight) hours as needed for nausea or vomiting. 20 tablet 0  . Polyvinyl Alcohol-Povidone (REFRESH OP) Apply 1 tablet to eye daily as needed. For dry eyes    . promethazine (PHENERGAN) 25 MG tablet Take 1 tablet (25 mg total) by mouth every 6 (six) hours as needed for nausea or vomiting (or headache). 12 tablet 0  . rOPINIRole (REQUIP) 2 MG tablet     . sodium chloride 0.9 % SOLN 100 mL with valproate 500 MG/5ML SOLN 500 mg Inject 500 mg into the vein once as  needed. To be infused over  a 15 minutes period, repeat once if partial relief.    . sodium chloride 0.9 % SOLN 100 mL with valproate 500 MG/5ML SOLN 500 mg Inject 500 mg into the vein once as needed. To be infused over  a 15 minutes period, repeat once if partial relief. 1000 mg 1  . SUMAtriptan 6 MG/0.5ML SOAJ Inject 6 mg into the skin as needed. 4 Cartridge 5  . terbinafine (LAMISIL) 250 MG tablet     . zolpidem (AMBIEN) 10 MG tablet Take 10 mg by mouth at bedtime.     No current facility-administered medications on file prior to visit.     Past Surgical History:  Procedure Laterality Date  . ANKLE SURGERY  12/88   left   . chest nodule  1990?   rt chest wall nodule removal  . GANGLION CYST EXCISION  2011  . lipoma  removal    . right bunioectomy    . SHOULDER SURGERY  01/13/2011   right, partial tear  . tumor resection left thigh      Allergies  Allergen Reactions  . Adhesive [Tape] Itching and Rash    Also reacted to Steri Strips and Band-Aids.  . Dilaudid [Hydromorphone Hcl] Itching  . Morphine Nausea And Vomiting  . Penicillins Hives    Has patient had a PCN reaction causing immediate rash, facial/tongue/throat swelling, SOB or lightheadedness with hypotension: YES Has patient had a PCN reaction causing severe rash involving mucus membranes or skin necrosis: NO Has patient had a PCN reaction that required hospitalization NO Has patient had a PCN reaction occurring within the last 10 years:NO If all of the above answers are "NO", then may proceed with Cephalosporin use.  Marland Kitchen Percocet [Oxycodone-Acetaminophen] Itching  . Prednisone Hives  . Provera [Medroxyprogesterone Acetate] Other (See Comments)    Causes manic episodes  . Ultram [Tramadol Hcl] Itching    Social History   Social History  . Marital status: Single    Spouse name: N/A  . Number of children: 0  . Years of education: N/A   Occupational History  .  [employer]   Social History Main Topics  . Smoking status: Never Smoker  . Smokeless tobacco: Never Used  . Alcohol use No  . Drug use: No  . Sexual activity: No   Other Topics Concern  . Not on file   Social History Narrative   Caffeine  2 sodas daily, 1 cup coffee daily.    Family History  Problem Relation Age of Onset  . Hypertension Mother   . Hyperlipidemia Mother   . Heart attack Father   . Heart disease Father   . Hypertension Father   . Bipolar disorder Father   . Diabetes Paternal Grandfather   . Heart disease Maternal Aunt   . Breast cancer Maternal Aunt   . Heart disease Maternal Grandmother   . Cancer Maternal Grandmother        colon    BP 122/86   Pulse 66   Ht 5\' 10"  (1.778 m)   Wt 267 lb (121.1 kg)   LMP 06/07/2016   BMI 38.31 kg/m    Review of Systems: See HPI above.     Objective:  Physical Exam:  Gen: NAD, comfortable in exam room  Left wrist/hand: No gross deformity, swelling, bruising. TTP snuffbox, 1st metacarpal to distal radius.  No other tenderness. FROM digits.  Pain on movements of thumb, less pain on extension and flexion of  wrist. Strength 5/5 finger abduction, extension, thumb opposition. NVI distally.  Right wrist/hand: FROM without pain.   Assessment & Plan:  1. Left wrist injury - independently reviewed repeat radiographs and no evidence fracture.  However, with pain and location of tenderness persisting will go ahead with MRI to assess for occult scaphoid fracture or scapholunate dissociation.  Can continue advil, continue thumb spica brace in meantime.  2. Low back pain - 2/2 lumbar strain, contusion, and piriformis syndrome.  She mentioned this is still bothering her also.  We can consider physical therapy - continue advil.    Addendum:  MRI reviewed and discussed with patient.  No concerning abnormalities, only tendinopathy.  She will start occupational therapy, use brace at work, plan to follow up in about 5-6 weeks.

## 2016-07-06 NOTE — Assessment & Plan Note (Signed)
independently reviewed repeat radiographs and no evidence fracture.  However, with pain and location of tenderness persisting will go ahead with MRI to assess for occult scaphoid fracture or scapholunate dissociation.  Can continue advil, continue thumb spica brace in meantime.

## 2016-07-06 NOTE — Assessment & Plan Note (Signed)
2/2 lumbar strain, contusion, and piriformis syndrome.  She mentioned this is still bothering her also.  We can consider physical therapy - continue advil.

## 2016-07-11 ENCOUNTER — Other Ambulatory Visit: Payer: Self-pay | Admitting: Family Medicine

## 2016-07-11 DIAGNOSIS — I10 Essential (primary) hypertension: Secondary | ICD-10-CM | POA: Diagnosis not present

## 2016-07-11 DIAGNOSIS — F319 Bipolar disorder, unspecified: Secondary | ICD-10-CM | POA: Diagnosis not present

## 2016-07-11 DIAGNOSIS — Z1231 Encounter for screening mammogram for malignant neoplasm of breast: Secondary | ICD-10-CM

## 2016-07-11 DIAGNOSIS — Z0000 Encounter for general adult medical examination without abnormal findings: Secondary | ICD-10-CM | POA: Diagnosis not present

## 2016-07-15 ENCOUNTER — Encounter (HOSPITAL_COMMUNITY): Payer: Self-pay | Admitting: Emergency Medicine

## 2016-07-15 ENCOUNTER — Emergency Department (HOSPITAL_COMMUNITY)
Admission: EM | Admit: 2016-07-15 | Discharge: 2016-07-16 | Disposition: A | Discharge status: Home / Self Care | Payer: PPO | Attending: Emergency Medicine | Admitting: Emergency Medicine

## 2016-07-15 ENCOUNTER — Emergency Department (HOSPITAL_COMMUNITY): Payer: PPO

## 2016-07-15 DIAGNOSIS — I10 Essential (primary) hypertension: Secondary | ICD-10-CM | POA: Diagnosis not present

## 2016-07-15 DIAGNOSIS — R45851 Suicidal ideations: Secondary | ICD-10-CM

## 2016-07-15 DIAGNOSIS — F319 Bipolar disorder, unspecified: Secondary | ICD-10-CM | POA: Insufficient documentation

## 2016-07-15 DIAGNOSIS — C499 Malignant neoplasm of connective and soft tissue, unspecified: Secondary | ICD-10-CM

## 2016-07-15 DIAGNOSIS — F314 Bipolar disorder, current episode depressed, severe, without psychotic features: Secondary | ICD-10-CM | POA: Diagnosis not present

## 2016-07-15 DIAGNOSIS — Z79899 Other long term (current) drug therapy: Secondary | ICD-10-CM | POA: Insufficient documentation

## 2016-07-15 DIAGNOSIS — Z886 Allergy status to analgesic agent status: Secondary | ICD-10-CM | POA: Diagnosis not present

## 2016-07-15 DIAGNOSIS — F3131 Bipolar disorder, current episode depressed, mild: Secondary | ICD-10-CM

## 2016-07-15 DIAGNOSIS — Z880 Allergy status to penicillin: Secondary | ICD-10-CM | POA: Diagnosis not present

## 2016-07-15 DIAGNOSIS — Z791 Long term (current) use of non-steroidal anti-inflammatories (NSAID): Secondary | ICD-10-CM | POA: Diagnosis not present

## 2016-07-15 DIAGNOSIS — R110 Nausea: Secondary | ICD-10-CM | POA: Diagnosis not present

## 2016-07-15 DIAGNOSIS — Z818 Family history of other mental and behavioral disorders: Secondary | ICD-10-CM | POA: Diagnosis not present

## 2016-07-15 DIAGNOSIS — Z888 Allergy status to other drugs, medicaments and biological substances status: Secondary | ICD-10-CM | POA: Diagnosis not present

## 2016-07-15 DIAGNOSIS — F909 Attention-deficit hyperactivity disorder, unspecified type: Secondary | ICD-10-CM | POA: Insufficient documentation

## 2016-07-15 LAB — CBC
HCT: 40.6 % (ref 36.0–46.0)
Hemoglobin: 13.4 g/dL (ref 12.0–15.0)
MCH: 30.8 pg (ref 26.0–34.0)
MCHC: 33 g/dL (ref 30.0–36.0)
MCV: 93.3 fL (ref 78.0–100.0)
Platelets: 203 10*3/uL (ref 150–400)
RBC: 4.35 MIL/uL (ref 3.87–5.11)
RDW: 12.5 % (ref 11.5–15.5)
WBC: 6.3 10*3/uL (ref 4.0–10.5)

## 2016-07-15 LAB — COMPREHENSIVE METABOLIC PANEL
ALT: 16 U/L (ref 14–54)
AST: 25 U/L (ref 15–41)
Albumin: 3.7 g/dL (ref 3.5–5.0)
Alkaline Phosphatase: 60 U/L (ref 38–126)
Anion gap: 10 (ref 5–15)
BUN: 13 mg/dL (ref 6–20)
CO2: 25 mmol/L (ref 22–32)
Calcium: 9.4 mg/dL (ref 8.9–10.3)
Chloride: 104 mmol/L (ref 101–111)
Creatinine, Ser: 1.08 mg/dL — ABNORMAL HIGH (ref 0.44–1.00)
GFR calc Af Amer: >60 mL/min (ref >60)
GFR calc non Af Amer: >60 mL/min (ref >60)
Glucose, Bld: 116 mg/dL — ABNORMAL HIGH (ref 65–99)
Potassium: 4.1 mmol/L (ref 3.5–5.1)
Sodium: 139 mmol/L (ref 135–145)
Total Bilirubin: 0.4 mg/dL (ref 0.3–1.2)
Total Protein: 6 g/dL — ABNORMAL LOW (ref 6.5–8.1)

## 2016-07-15 LAB — URINALYSIS, ROUTINE W REFLEX MICROSCOPIC
Bilirubin Urine: NEGATIVE
Glucose, UA: NEGATIVE mg/dL
Ketones, ur: NEGATIVE mg/dL
Nitrite: NEGATIVE
Protein, ur: NEGATIVE mg/dL
Specific Gravity, Urine: 1.025 (ref 1.005–1.030)
pH: 6 (ref 5.0–8.0)

## 2016-07-15 LAB — URINALYSIS, MICROSCOPIC (REFLEX)

## 2016-07-15 LAB — SALICYLATE LEVEL: Salicylate Lvl: <7 mg/dL (ref 2.8–30.0)

## 2016-07-15 LAB — RAPID URINE DRUG SCREEN, HOSP PERFORMED
Amphetamines: NOT DETECTED
Barbiturates: NOT DETECTED
Benzodiazepines: NOT DETECTED
Cocaine: NOT DETECTED
Opiates: NOT DETECTED
Tetrahydrocannabinol: NOT DETECTED

## 2016-07-15 LAB — ETHANOL: Alcohol, Ethyl (B): <5 mg/dL (ref <5)

## 2016-07-15 LAB — PREGNANCY, URINE: Preg Test, Ur: NEGATIVE

## 2016-07-15 LAB — ACETAMINOPHEN LEVEL: Acetaminophen (Tylenol), Serum: <10 ug/mL — ABNORMAL LOW (ref 10–30)

## 2016-07-15 MED ORDER — LAMOTRIGINE 100 MG PO TABS
300.0000 mg | ORAL_TABLET | Freq: Every day | ORAL | Status: DC
  Administered 2016-07-15: 300 mg via ORAL
  Filled 2016-07-15: qty 3

## 2016-07-15 MED ORDER — ONDANSETRON HCL 4 MG PO TABS: 4.0000 mg | ORAL_TABLET | Freq: Three times a day (TID) | ORAL | Status: DC | PRN

## 2016-07-15 MED ORDER — ROPINIROLE HCL 1 MG PO TABS
2.0000 mg | ORAL_TABLET | Freq: Three times a day (TID) | ORAL | Status: DC
  Administered 2016-07-15: 2 mg via ORAL
  Filled 2016-07-15 (×4): qty 2

## 2016-07-15 MED ORDER — PROMETHAZINE HCL 25 MG PO TABS: 25.0000 mg | ORAL_TABLET | Freq: Four times a day (QID) | ORAL | Status: DC | PRN

## 2016-07-15 MED ORDER — ADULT MULTIVITAMIN W/MINERALS CH
1.0000 | ORAL_TABLET | Freq: Every day | ORAL | Status: DC
  Administered 2016-07-16: 1 via ORAL
  Filled 2016-07-15: qty 1

## 2016-07-15 MED ORDER — METOPROLOL SUCCINATE ER 100 MG PO TB24
100.0000 mg | ORAL_TABLET | Freq: Every day | ORAL | Status: DC
  Administered 2016-07-16: 100 mg via ORAL
  Filled 2016-07-15 (×2): qty 1

## 2016-07-15 MED ORDER — LURASIDONE HCL 40 MG PO TABS
120.0000 mg | ORAL_TABLET | Freq: Every evening | ORAL | Status: DC
  Administered 2016-07-15: 120 mg via ORAL
  Filled 2016-07-15: qty 3

## 2016-07-15 MED ORDER — HYDROXYZINE HCL 25 MG PO TABS
50.0000 mg | ORAL_TABLET | Freq: Three times a day (TID) | ORAL | Status: DC | PRN
  Administered 2016-07-15: 50 mg via ORAL
  Filled 2016-07-15: qty 2

## 2016-07-15 MED ORDER — DIVALPROEX SODIUM ER 500 MG PO TB24
1500.0000 mg | ORAL_TABLET | Freq: Every day | ORAL | Status: DC
  Administered 2016-07-15: 1500 mg via ORAL
  Filled 2016-07-15: qty 3

## 2016-07-15 MED ORDER — IBUPROFEN 200 MG PO TABS: 600.0000 mg | ORAL_TABLET | Freq: Three times a day (TID) | ORAL | Status: DC | PRN

## 2016-07-15 MED ORDER — METAXALONE 800 MG PO TABS
800.0000 mg | ORAL_TABLET | Freq: Three times a day (TID) | ORAL | Status: DC | PRN
Start: 2016-07-15 — End: 2016-07-16
  Filled 2016-07-15: qty 1

## 2016-07-15 MED ORDER — ZOLPIDEM TARTRATE 10 MG PO TABS
10.0000 mg | ORAL_TABLET | Freq: Every day | ORAL | Status: DC
  Administered 2016-07-15: 10 mg via ORAL
  Filled 2016-07-15: qty 1

## 2016-07-15 NOTE — Progress Notes (Signed)
Per Lindon Romp, NP meets inpatient criteria [name], LPC-A, [facility]  Counselor 07/15/2016 10:00 PM

## 2016-07-15 NOTE — ED Triage Notes (Signed)
Patient with diagnosis of bipolar and personality disorder who has been compliant with her medications comes in today with recurrent thoughts of suicide.  She doesn't have a particular plan but says she knows how to do it if she wanted to.  Patient also has a low grade fever but denies any recent illness.  She does report mild nausea for several days with no vomiting and denies any urinary symptoms or sore throat.

## 2016-07-15 NOTE — ED Provider Notes (Signed)
[facility] DEPT Provider Note   CSN: [id number] Arrival date & time: 07/15/16  1814     History   Chief Complaint Chief Complaint  Patient presents with  . Suicidal    HPI [name] is a 46 y.o. female.  The history is provided by the patient.     "I cannot get my brain out of the suicide mode". It has been getting worse over several weeks. Her therapist has asked her multiple times whether she needs to go the hospital. Exposure to suicide of kate spade and anthony bourdain made things worse. Has wanted to sleep more. History of inpatient psychiatric treatment. When I see knives and trees, she thinks "that's effective". She knows there is a website that she can look up ways to hurt self that would be most effective.  Thinks driving into a tree would be most effective.   Felt a little nauseous and dizzy. Felt foggy. Decreased appetite. No urinary symptoms. No cough, congestion, sore throat or runny nose. No vomiting or diarrhea.  Past Medical History:  Diagnosis Date  . Achilles tendinitis   . Achilles tendinitis   . ADHD (attention deficit hyperactivity disorder)   . Allergy   . Arthritis   . Bipolar affective ([place])   . Bipolar disorder ([place])   . Eczema   . Ganglion cyst 09/29/2009   left wrist (2 cyst)  . Hyperprolactinemia ([place])   . Hypertension   . Lipoma   . Migraine   . Personality disorder   . Pes planus     Patient Active Problem List   Diagnosis Date Noted  . Low back pain 06/27/2016  . Left wrist injury, subsequent encounter 06/27/2016  . Pain of left thumb 10/07/2015  . 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 Strain of left thumb 02/11/2015  . Strain of right forearm 02/11/2015  . Right shoulder pain 12/31/2014  . Injury of right little finger 12/31/2014  . Episodic cluster headache, 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  . 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 Hypersomnia,  recurrent 12/02/2014  . Migraine aura, persistent, intractable, with status migrainosus 12/02/2014  . Lower back injury 09/30/2014  . Bipolar I disorder, most recent episode depressed ([place])   . 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 Injury of left index finger 08/20/2014  . Right ankle sprain 06/01/2014  . Contusion, multiple sites 06/01/2014  . Strain of right gastrocnemius muscle 06/01/2014  . Phonophobia 05/04/2014  . Photophobia of both eyes 05/04/2014  . Emotionally unstable borderline personality disorder 05/04/2014  . Nausea with vomiting 05/04/2014  . Mixed bipolar I disorder ([place])   . Bipolar I disorder, most recent episode mixed ([place]) 04/18/2014  . Bipolar affective disorder, depressed, severe ([place]) 04/12/2014  . Suicidal ideation 04/12/2014  . Injury of right shoulder and upper arm 02/17/2014  . Left leg pain 06/03/2013  . Right hip pain 06/03/2013  . Migraine with status migrainosus 01/08/2013  . Personality disorder   . Chronic migraine 05/08/2012  . Contact dermatitis 11/27/2011  . Major depressive disorder, recurrent episode ([place]) 10/27/2011  . Generalized anxiety disorder 10/27/2011  . 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 Borderline personality disorder 10/27/2011  . Right foot pain 09/28/2011  . 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 Ganglion cyst 09/29/2009  . PES PLANUS 07/01/2008  . BIPOLAR DISORDER UNSPECIFIED 06/09/2008  Past Surgical History:  Procedure Laterality Date  . ANKLE SURGERY  12/88   left   . chest nodule  1990?   rt chest wall nodule removal  . GANGLION CYST EXCISION  2011  . lipoma removal    . right bunioectomy    . SHOULDER SURGERY  01/13/2011   right, partial tear  . tumor resection left thigh      OB History    No data available       Home Medications    Prior to Admission  medications   Medication Sig Start Date End Date Taking? Authorizing Provider  azelastine (ASTELIN) 0.1 % nasal spray instill 1 to 2 sprays into each nostril twice a day 10/21/14  Yes [provider]  divalproex (DEPAKOTE ER) 500 MG 24 hr tablet take 3 tablets by mouth at bedtime 02/08/16  Yes Dohmeier, Asencion Partridge, MD  hydrocortisone cream 0.5 % Apply 1 application topically daily as needed for itching.   Yes [provider]  hydrOXYzine (VISTARIL) 50 MG capsule take 1 capsule by mouth twice a day and 1 capsule at bedtime 03/11/15  Yes [provider]  lamoTRIgine (LAMICTAL) 200 MG tablet Take 1 tablet (200 mg total) by mouth at bedtime. For mood stabilization Patient taking differently: Take 300 mg by mouth at bedtime. For mood stabilization 09/28/14  Yes Nwoko, [name] Pun I, NP  Lurasidone HCl 120 MG TABS Take 1 tablet by mouth every evening.   Yes [provider]  metaxalone (SKELAXIN) 800 MG tablet Take 1 tablet (800 mg total) by mouth 3 (three) times daily as needed for muscle spasms. 06/21/16  Yes Hudnall, Sharyn Lull, MD  metoprolol succinate (TOPROL-XL) 100 MG 24 hr tablet Take 1 tablet (100 mg total) by mouth daily. Take with or immediately following a meal for blood pressure control. 09/28/14  Yes Lindell Spar I, NP  Multiple Vitamin (MULTIVITAMIN WITH MINERALS) TABS tablet Take 1 tablet by mouth daily. For nutritional supplementation. 09/28/14  Yes Lindell Spar I, NP  ondansetron (ZOFRAN) 4 MG tablet Take 1 tablet (4 mg total) by mouth every 8 (eight) hours as needed for nausea or vomiting. 06/22/16  Yes Dohmeier, Asencion Partridge, MD  Polyvinyl Alcohol-Povidone (REFRESH OP) Apply 1 tablet to eye daily as needed. For dry eyes   Yes [provider]  promethazine (PHENERGAN) 25 MG tablet Take 1 tablet (25 mg total) by mouth every 6 (six) hours as needed for nausea or vomiting (or headache). 11/17/15  Yes Duffy Bruce, MD  rOPINIRole (REQUIP) 2 MG tablet  02/24/16  Yes [provider]  sodium chloride 0.9 % SOLN 100 mL with valproate 500 MG/5ML SOLN 500 mg Inject 500 mg into the vein once as needed. To be infused over  a 15 minutes period, repeat once if partial relief. 12/02/14  Yes Dohmeier, Asencion Partridge, MD  sodium chloride 0.9 % SOLN 100 mL with valproate 500 MG/5ML SOLN 500 mg Inject 500 mg into the vein once as needed. To be infused over  a 15 minutes period, repeat once if partial relief. 06/22/16  Yes Dohmeier, Asencion Partridge, MD  SUMAtriptan 6 MG/0.5ML SOAJ Inject 6 mg into the skin as needed. 06/22/16  Yes Dohmeier, Asencion Partridge, MD  zolpidem (AMBIEN) 10 MG tablet Take 10 mg by mouth at bedtime.   Yes [provider]  meloxicam (MOBIC) 15 MG tablet Take 1 tablet (15 mg total) by mouth daily. Patient not taking: Reported on 07/15/2016 06/21/16   Dene Gentry, MD  Family History Family History  Problem Relation Age of Onset  . Hypertension Mother   . Hyperlipidemia Mother   . Heart attack Father   . Heart disease Father   . Hypertension Father   . Bipolar disorder Father   . Diabetes Paternal Grandfather   . Heart disease Maternal Aunt   . Breast cancer Maternal Aunt   . Heart disease Maternal Grandmother   . Cancer Maternal Grandmother        colon    Social History Social History  Substance Use Topics  . Smoking status: Never Smoker  . Smokeless tobacco: Never Used  . Alcohol use No     Allergies   Adhesive [tape]; Dilaudid [hydromorphone hcl]; Morphine; Penicillins; Percocet [oxycodone-acetaminophen]; Prednisone; Provera [medroxyprogesterone acetate]; and Ultram [tramadol hcl]   Review of Systems Review of Systems  Constitutional: Negative for fatigue and fever.  HENT: Negative for congestion, rhinorrhea and sore throat.   Respiratory: Negative for cough.   Cardiovascular: Negative for chest pain.  Gastrointestinal: Positive for nausea. Negative for abdominal pain, diarrhea and vomiting.  Genitourinary: Negative for difficulty  urinating, dysuria and frequency.  Musculoskeletal: Negative for joint swelling.  Skin: Negative for rash.  Psychiatric/Behavioral: Negative for confusion.  All other systems reviewed and are negative.    Physical Exam Updated Vital Signs BP 120/63 (BP Location: Right Arm)   Pulse 68   Temp 98.7 F (37.1 C) (Oral)   Resp 18   Ht 5\' 10"  (1.778 m)   Wt 121.1 kg (267 lb)   LMP 07/08/2016 (Approximate)   SpO2 95%   BMI 38.31 kg/m   Physical Exam Physical Exam  Nursing note and vitals reviewed. Constitutional: Well developed, well nourished, non-toxic, and in no acute distress Head: Normocephalic and atraumatic.  Mouth/Throat: Oropharynx is clear and moist.  Neck: Normal range of motion. Neck supple.  Cardiovascular: Normal rate and regular rhythm.   Pulmonary/Chest: Effort normal and breath sounds normal.  Abdominal: Soft. There is no tenderness. There is no rebound and no guarding.  Musculoskeletal: Normal range of motion.  Neurological: Alert, no facial droop, fluent speech, moves all extremities symmetrically Skin: Skin is warm and dry.  Psychiatric: Cooperative   ED Treatments / Results  Labs (all labs ordered are listed, but only abnormal results are displayed) Labs Reviewed  COMPREHENSIVE METABOLIC PANEL - Abnormal; Notable for the following:       Result Value   Glucose, Bld 116 (*)    Creatinine, Ser 1.08 (*)    Total Protein 6.0 (*)    All other components within normal limits  ACETAMINOPHEN LEVEL - Abnormal; Notable for the following:    Acetaminophen (Tylenol), Serum <10 (*)    All other components within normal limits  URINALYSIS, ROUTINE W REFLEX MICROSCOPIC - Abnormal; Notable for the following:    APPearance CLOUDY (*)    Hgb urine dipstick SMALL (*)    Leukocytes, UA TRACE (*)    All other components within normal limits  URINALYSIS, MICROSCOPIC (REFLEX) - Abnormal; Notable for the following:    Bacteria, UA FEW (*)    Squamous Epithelial / LPF  6-30 (*)    All other components within normal limits  URINE CULTURE  ETHANOL  SALICYLATE LEVEL  CBC  RAPID URINE DRUG SCREEN, HOSP PERFORMED  PREGNANCY, URINE    EKG  EKG Interpretation None       Radiology Dg Chest 2 View  Result Date: 07/15/2016 CLINICAL DATA:  Nausea and dizziness. EXAM: CHEST  2  VIEW COMPARISON:  09/11/2013 FINDINGS: Cardiomediastinal silhouette is normal. Mediastinal contours appear intact. Tortuosity of the aorta. There is no evidence of focal airspace consolidation, pleural effusion or pneumothorax. Low lung volumes. Osseous structures are without acute abnormality. Soft tissues are grossly normal. IMPRESSION: Tortuosity of the aorta. Low lung volumes. Electronically Signed   By: Fidela Salisbury M.D.   On: 07/15/2016 20:41    Procedures Procedures (including critical care time)  Medications Ordered in ED Medications - No data to display   Initial Impression / Assessment and Plan / ED Course  I have reviewed the triage vital signs and the nursing notes.  Pertinent labs & imaging results that were available during my care of the patient were reviewed by me and considered in my medical decision making (see chart for details).     Presenting with increasing suicidal thoughts. She is well-appearing in no acute distress. Did initially have a fever of 100.4 Fahrenheit in triage. Repeat temperature of 98.3 without any antipyretics. She denies having any infectious symptoms. Exam is also nonfocal for infection. UA without signs of infection and chest x-ray without pneumonia. Blood work is reassuring. No skin lesions.  at this time I do not suspect sepsis or serious infection. Maybe possible viral infection. She is felt to be medically cleared for TTS consult.   TTS recommending inpatient admission. Placement pending  Final Clinical Impressions(s) / ED Diagnoses   Final diagnoses:  Suicidal ideation    New Prescriptions New Prescriptions   No  medications on file     Forde Dandy, MD 07/15/16 2235

## 2016-07-15 NOTE — ED Notes (Signed)
Bed: WA28 Expected date:  Expected time:  Means of arrival:  Comments: 

## 2016-07-15 NOTE — BH Assessment (Signed)
Tele Assessment Note   [name] is an 46 y.o. female, Caucasian, Single who  Presents to [facility] per ED report: "I cannot get my brain out of the suicide mode". It has been getting worse over several weeks. Her therapist has asked her multiple times whether she needs to go the hospital. Exposure to suicide of kate spade and anthony bourdain made things worse. Has wanted to sleep more. History of inpatient psychiatric treatment. When I see knives and trees, she thinks "that's effective". She knows there is a website that she can look up ways to hurt self that would be most effective.  Thinks driving into a tree would be most effective.   Patient states primary concern is worsening depression/SI and lately triggered any time sees knife or sharp and trees. Patient states that SI is ongoing, but plans are more apparent and possible recently as well as invading thoughts of plan. Patient states she does work, and has hx,. Of Bipolar and sis med compliant. Patient states she resides with mother and has had as little if any 4 hours or less per night sleep in past week.  Patient acknowledges current SI with potential plan involving knife or running into a tree. Patient denies current HI and AVH as well as hx. Of S.A. Patient acknowledges past inpatient psych multiple with [facility] last in 08/2014 for Bipolar and SI. Patient states is seen outpatient for psych via Pauline Good and ongoing consistent therapy with Ruben Reason for Bipolar.  Patient is dressed in scrubs and is alert and oriented x4. Patient speech was within normal limits and motor behavior appeared normal. Patient thought process is coherent. Patient  does not appear to be responding to internal stimuli. Patient was cooperative throughout the assessment and states that  she is agreeable to inpatient psychiatric treatment.   Diagnosis: Bipolar  Affective Disorder  Past Medical History:  Past Medical History:  Diagnosis Date  . Achilles  tendinitis   . Achilles tendinitis   . ADHD (attention deficit hyperactivity disorder)   . Allergy   . Arthritis   . Bipolar affective ([place])   . Bipolar disorder ([place])   . Eczema   . Ganglion cyst 09/29/2009   left wrist (2 cyst)  . Hyperprolactinemia ([place])   . Hypertension   . Lipoma   . Migraine   . Personality disorder   . Pes planus     Past Surgical History:  Procedure Laterality Date  . ANKLE SURGERY  12/88   left   . chest nodule  1990?   rt chest wall nodule removal  . GANGLION CYST EXCISION  2011  . lipoma removal    . right bunioectomy    . SHOULDER SURGERY  01/13/2011   right, partial tear  . tumor resection left thigh      Family History:  Family History  Problem Relation Age of Onset  . Hypertension Mother   . Hyperlipidemia Mother   . Heart attack Father   . Heart disease Father   . Hypertension Father   . Bipolar disorder Father   . Diabetes Paternal Grandfather   . Heart disease Maternal Aunt   . Breast cancer Maternal Aunt   . Heart disease Maternal Grandmother   . Cancer Maternal Grandmother        colon    Social History:  reports that she has never smoked. She has never used smokeless tobacco. She reports that she does not drink alcohol or use drugs.  Additional Social History:  Alcohol / Drug Use Pain Medications: SEE MAR Prescriptions: SEE MAR Over the Counter: SEE MAR History of alcohol / drug use?: No history of alcohol / drug abuse Longest period of sobriety (when/how long): pt. denies  CIWA: CIWA-Ar BP: (!) 141/84 Pulse Rate: 73 COWS:    PATIENT STRENGTHS: (choose at least two) Ability for insight Communication skills  Allergies:  Allergies  Allergen Reactions  . Adhesive [Tape] Itching and Rash    Also reacted to Steri Strips and Band-Aids.  . Dilaudid [Hydromorphone Hcl] Itching  . Morphine Nausea And Vomiting  . Penicillins Hives    Has patient had a PCN reaction causing immediate rash, facial/tongue/throat  swelling, SOB or lightheadedness with hypotension: YES Has patient had a PCN reaction causing severe rash involving mucus membranes or skin necrosis: NO Has patient had a PCN reaction that required hospitalization NO Has patient had a PCN reaction occurring within the last 10 years:NO If all of the above answers are "NO", then may proceed with Cephalosporin use.  Marland Kitchen Percocet [Oxycodone-Acetaminophen] Itching  . Prednisone Hives  . Provera [Medroxyprogesterone Acetate] Other (See Comments)    Causes manic episodes  . Ultram [Tramadol Hcl] Itching    Home Medications:  (Not in a hospital admission)  OB/GYN Status:  Patient's last menstrual period was 07/08/2016 (approximate).  General Assessment Data Location of Assessment: [facility] TTS Assessment: In system Is this a Tele or Face-to-Face Assessment?: Face-to-Face Is this an Initial Assessment or a Re-assessment for this encounter?: Initial Assessment Marital status: Single Maiden name: n/a Is patient pregnant?: No Pregnancy Status: No Living Arrangements: Parent Can pt return to current living arrangement?: Yes Admission Status: Voluntary Is patient capable of signing voluntary admission?: Yes Referral Source: Self/Family/Friend Insurance type: healthte[employer] Advantage     Crisis Care Plan Living Arrangements: Parent Name of Psychiatrist: Pauline Good Name of Therapist: Ruben Reason  Education Status Is patient currently in school?: No Current Grade: n/a 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 completed: B.S. degree college Name of school: n/a Contact person: mother  Risk to self with the past 6 months Suicidal Ideation: Yes-Currently Present Has patient been a risk to self within the past 6 months prior to admission? : Yes Suicidal Intent: Yes-Currently Present Has patient had any suicidal intent within the past 6 months prior to admission? : Yes Is patient at risk for suicide?: Yes Suicidal Plan?: Yes-Currently Present Has  patient had any suicidal plan within the past 6 months prior to admission? : Yes Specify Current Suicidal Plan: cut self/run into tree Access to Means: Yes Specify Access to Suicidal Means: access to vehicle, sharps What has been your use of drugs/alcohol within the last 12 months?: none Previous Attempts/Gestures: Yes How many times?: 3 Other Self Harm Risks: none Triggers for Past Attempts: None known Intentional Self Injurious Behavior: None Family Suicide History: No Recent stressful life event(s): Other (Comment) Persecutory voices/beliefs?: No Depression: Yes Depression Symptoms: Insomnia, Tearfulness, Isolating, Fatigue, Guilt, Loss of interest in usual pleasures, Feeling worthless/self pity Substance abuse history and/or treatment for substance abuse?: No Suicide prevention information given to non-admitted patients: Yes  Risk to Others within the past 6 months Homicidal Ideation: No Does patient have any lifetime risk of violence toward others beyond the six months prior to admission? : No Thoughts of Harm to Others: No Current Homicidal Intent: No Current Homicidal Plan: No Access to Homicidal Means: No Identified Victim: none History of harm to others?: No Assessment of Violence: None  Noted Violent Behavior Description: n/a Does patient have access to weapons?: No Criminal Charges Pending?: No Does patient have a court date: No Is patient on probation?: No  Psychosis Hallucinations: None noted Delusions: None noted  Mental Status Report Appearance/Hygiene: In scrubs Eye Contact: Good Motor Activity: Freedom of movement Speech: Logical/coherent Level of Consciousness: Alert Mood: Depressed Affect: Depressed Anxiety Level: Moderate Thought Processes: Relevant Judgement: Unimpaired Orientation: Person, Place, Time, Situation, Appropriate for developmental age Obsessive Compulsive Thoughts/Behaviors: Moderate  Cognitive Functioning Concentration:  Decreased Memory: Recent Intact, Remote Intact IQ: Average Insight: Good Impulse Control: Fair Appetite: Fair Weight Loss: 0 Weight Gain: 0 Sleep: Decreased Total Hours of Sleep: 4 Vegetative Symptoms: None  ADLScreening [facility] Assessment Services) Patient's cognitive ability adequate to safely complete daily activities?: Yes Patient able to express need for assistance with ADLs?: Yes Independently performs ADLs?: Yes (appropriate for developmental age)  Prior Inpatient Therapy Prior Inpatient Therapy: Yes Prior Therapy Dates: 2016 and other Prior Therapy Facilty/Provider(s): [facility] Reason for Treatment: Bipolar/SI  Prior Outpatient Therapy Prior Outpatient Therapy: Yes Prior Therapy Dates: current Prior Therapy Facilty/Provider(s): Ruben Reason and Dr. Pauline Good Reason for Treatment: bipolar/med management Does patient have an ACCT team?: No Does patient have Intensive In-House Services?  : No Does patient have [facility] services? : No Does patient have P4CC services?: No  ADL Screening (condition at time of admission) Patient's cognitive ability adequate to safely complete daily activities?: Yes Is the patient deaf or have difficulty hearing?: No Does the patient have difficulty seeing, even when wearing glasses/contacts?: No Does the patient have difficulty concentrating, remembering, or making decisions?: No Patient able to express need for assistance with ADLs?: Yes Does the patient have difficulty dressing or bathing?: No Independently performs ADLs?: Yes (appropriate for developmental age) Does the patient have difficulty walking or climbing stairs?: No Weakness of Legs: None Weakness of Arms/Hands: None       Abuse/Neglect Assessment (Assessment to be complete while patient is alone) Physical Abuse: Denies Verbal Abuse: Yes, past (Comment) Sexual Abuse: Denies Exploitation of patient/patient's resources: Denies Self-Neglect: Denies Values /  Beliefs Cultural Requests During Hospitalization: None Spiritual Requests During Hospitalization: None   Advance Directives (For Healthcare) Does Patient Have a Medical Advance Directive?: No    Additional Information 1:1 In Past 12 Months?: No CIRT Risk: No Elopement Risk: No Does patient have medical clearance?: Yes     Disposition: Per Corene Cornea berry, NP meets inpatient criteria Disposition Initial Assessment Completed for this Encounter: Yes Disposition of Patient: Other dispositions (TBD)  Kristeen Mans 07/15/2016 9:51 PM

## 2016-07-15 NOTE — ED Notes (Signed)
SBAR Report received from previous nurse. Pt received sad and irritable but compliant on unit. Pt endorses current SI, depression 8/10, anxiety 10/10, and pain 4/10 at this time, and appears otherwise stable and free of distress. Pt denies A/ VH, and HI at this time. Pt reminded of camera surveillance, q 15 min rounds, and rules of the milieu. When pt informed about the current plan for her she endorsed that she would not accept being sent to any other place other than the [facility]. TTS (sean) informed of this as placement explored. Will continue to assess.

## 2016-07-16 DIAGNOSIS — F314 Bipolar disorder, current episode depressed, severe, without psychotic features: Secondary | ICD-10-CM

## 2016-07-16 DIAGNOSIS — Z791 Long term (current) use of non-steroidal anti-inflammatories (NSAID): Secondary | ICD-10-CM

## 2016-07-16 DIAGNOSIS — Z818 Family history of other mental and behavioral disorders: Secondary | ICD-10-CM

## 2016-07-16 DIAGNOSIS — Z886 Allergy status to analgesic agent status: Secondary | ICD-10-CM

## 2016-07-16 DIAGNOSIS — Z880 Allergy status to penicillin: Secondary | ICD-10-CM

## 2016-07-16 DIAGNOSIS — Z79899 Other long term (current) drug therapy: Secondary | ICD-10-CM | POA: Diagnosis not present

## 2016-07-16 DIAGNOSIS — R45851 Suicidal ideations: Secondary | ICD-10-CM

## 2016-07-16 DIAGNOSIS — Z888 Allergy status to other drugs, medicaments and biological substances status: Secondary | ICD-10-CM | POA: Diagnosis not present

## 2016-07-16 NOTE — BHH Suicide Risk Assessment (Signed)
Suicide Risk Assessment  Discharge Assessment   [facility] Discharge Suicide Risk Assessment   Principal Problem: Bipolar affective disorder, depressed, mild ([place]) Discharge Diagnoses:  Patient Active Problem List   Diagnosis Date Noted  . Bipolar affective disorder, depressed, mild ([place]) [F31.31] 04/12/2014    Priority: High  . Suicidal ideation [R45.851] 04/12/2014    Priority: High  . Low back pain [M54.5] 06/27/2016  . Left wrist injury, subsequent encounter [S69.92XD] 06/27/2016  . Pain of left thumb [M79.645] 10/07/2015  . 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 Strain of left thumb [IMO0002] 02/11/2015  . Strain of right forearm [S56.911A] 02/11/2015  . Right shoulder pain [M25.511] 12/31/2014  . Injury of right little finger [S69.91XA] 12/31/2014  . Episodic cluster headache, not intractable [G44.019] 12/02/2014  . 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 Hypersomnia, recurrent [G47.13] 12/02/2014  . 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 Lower back injury [S39.92XA] 09/30/2014  . Bipolar I disorder, most recent episode depressed ([place]) [F31.30]   . MDD (major depressive disorder), recurrent, severe, with psychosis ([place]) [F33.3] 09/23/2014  . Injury of left index finger [S69.92XA] 08/20/2014  . Right ankle sprain [S93.401A] 06/01/2014  . Contusion, multiple sites [T07.XXXA] 06/01/2014  . Strain of right gastrocnemius muscle [S86.111A] 06/01/2014  . Phonophobia [F40.298] 05/04/2014  . Photophobia of both eyes [H53.143] 05/04/2014  . 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 Nausea with vomiting [R11.2] 05/04/2014  . Mixed bipolar I disorder ([place]) [F31.60]   . Bipolar I disorder, most recent episode mixed ([employer]) [F31.60] 04/18/2014  . Injury of right shoulder and upper arm [S49.91XA] 02/17/2014  . Left  leg pain [M79.605] 06/03/2013  . Right hip pain [M25.551] 06/03/2013  . Migraine with status migrainosus [G43.901] 01/08/2013  . Personality disorder [F60.9]   . Chronic migraine [G43.709] 05/08/2012  . Contact dermatitis [L25.9] 11/27/2011  . Major depressive disorder, recurrent episode ([place]) [F33.9] 10/27/2011  . Generalized anxiety disorder [F41.1] 10/27/2011  . 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 Borderline personality disorder [F60.3] 10/27/2011  . Right foot pain [M79.671] 09/28/2011  . 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 Ganglion cyst [M67.40] 09/29/2009  . 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 BIPOLAR DISORDER UNSPECIFIED [F31.9] 06/09/2008    Total Time spent with patient: 45 minutes  Musculoskeletal: Strength & Muscle Tone: within normal limits Gait & Station: normal Patient leans: N/A  Psychiatric Specialty Exam: Physical Exam  Constitutional: She is oriented to person, place, and time. She appears well-developed and well-nourished.  HENT:  Head: Normocephalic.  Respiratory: Effort normal.  Musculoskeletal: Normal range of motion.  Neurological: She is alert and oriented to person, place, and time.  Psychiatric: Her speech is normal and behavior is normal. Judgment and thought content normal. Cognition and memory are normal. She exhibits a depressed mood.    Review of Systems  Psychiatric/Behavioral: Positive for depression.  All other systems reviewed and are negative.   Blood pressure 100/60, pulse 79, temperature 99.6 F (37.6 C), temperature source Oral, resp. rate 16, height 5\' 10"  (1.778 m), weight 121.1 kg (267 lb), last menstrual period 07/08/2016, SpO2 98 %.Body mass index is 38.31 kg/m.  General Appearance: Casual  Eye Contact:  Good  Speech:  Normal Rate  Volume:  Normal  Mood:  Depressed, mild  Affect:  Congruent  Thought  Process:  Coherent and Descriptions of Associations: Intact  Orientation:  Full (Time, Place, and Person)  Thought Content:  WDL and Logical  Suicidal Thoughts:  No  Homicidal Thoughts:  No  Memory:  Immediate;   Good Recent;   Good Remote;   Good  Judgement:  Fair  Insight:  Fair  Psychomotor Activity:  Normal  Concentration:  Concentration: Good and Attention Span: Good  Recall:  Good  Fund of Knowledge:  Good  Language:  Good  Akathisia:  No  Handed:  Right  AIMS (if indicated):     Assets:  Housing Leisure Time Physical Health Resilience Social Support  ADL's:  Intact  Cognition:  WNL  Sleep:       Mental Status Per Nursing Assessment::   On Admission:   suicidal ideations  Demographic Factors:  Caucasian  Loss Factors: NA  Historical Factors: NA  Risk Reduction Factors:   Sense of responsibility to family, Living with another person, especially a relative, Positive social support and Positive therapeutic relationship  Continued Clinical Symptoms:  Depression, mild  Cognitive Features That Contribute To Risk:  None    Suicide Risk:  Minimal: No identifiable suicidal ideation.  Patients presenting with no risk factors but with morbid ruminations; may be classified as minimal risk based on the severity of the depressive symptoms    Plan Of Care/Follow-up recommendations:  Activity:  as tolerated Diet:  heart healhty diet  Ranard Harte, NP 07/16/2016, 10:13 AM

## 2016-07-16 NOTE — Consult Note (Signed)
[facility] Consult   Reason for Consult:  Suicidal ideations Referring Physician:  EDP Patient Identification: [name] MRN:  [id number] Principal Diagnosis: bipolar affective disorder, depressed, mild Diagnosis:   Patient Active Problem List   Diagnosis Date Noted  . Bipolar affective disorder, depressed, severe ([place]) [F31.4] 04/12/2014    Priority: High  . Suicidal ideation [R45.851] 04/12/2014    Priority: High  . Low back pain [M54.5] 06/27/2016  . Left wrist injury, subsequent encounter [S69.92XD] 06/27/2016  . Pain of left thumb [M79.645] 10/07/2015  . 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 Strain of left thumb [IMO0002] 02/11/2015  . Strain of right forearm [S56.911A] 02/11/2015  . Right shoulder pain [M25.511] 12/31/2014  . Injury of right little finger [S69.91XA] 12/31/2014  . Episodic cluster headache, not intractable [G44.019] 12/02/2014  . 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 Hypersomnia, recurrent [G47.13] 12/02/2014  . 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 Lower back injury [S39.92XA] 09/30/2014  . Bipolar I disorder, most recent episode depressed ([place]) [F31.30]   . MDD (major depressive disorder), recurrent, severe, with psychosis ([place]) [F33.3] 09/23/2014  . Injury of left index finger [S69.92XA] 08/20/2014  . Right ankle sprain [S93.401A] 06/01/2014  . Contusion, multiple sites [T07.XXXA] 06/01/2014  . Strain of right gastrocnemius muscle [S86.111A] 06/01/2014  . Phonophobia [F40.298] 05/04/2014  . Photophobia of both eyes [H53.143] 05/04/2014  . 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 Nausea with vomiting [R11.2] 05/04/2014  . Mixed bipolar I disorder ([place]) [F31.60]   . Bipolar I disorder, most recent episode mixed ([place]) [F31.60] 04/18/2014  . Injury of  right shoulder and upper arm [S49.91XA] 02/17/2014  . Left leg pain [M79.605] 06/03/2013  . Right hip pain [M25.551] 06/03/2013  . Migraine with status migrainosus [G43.901] 01/08/2013  . Personality disorder [F60.9]   . Chronic migraine [G43.709] 05/08/2012  . Contact dermatitis [L25.9] 11/27/2011  . Major depressive disorder, recurrent episode ([place]) [F33.9] 10/27/2011  . Generalized anxiety disorder [F41.1] 10/27/2011  . 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 Borderline personality disorder [F60.3] 10/27/2011  . Right foot pain [M79.671] 09/28/2011  . 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 Ganglion cyst [M67.40] 09/29/2009  . 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 BIPOLAR DISORDER UNSPECIFIED [F31.9] 06/09/2008    Total Time spent with patient: 45 minutes  Subjective:   [name] is a 46 y.o. female patient admitted with suicidal with a plan.  HPI:  46 yo female who presented to the ED after having suicidal ideations.  Today on assessment, one of the first things she said was she "only wanted to go to [facility], I came in voluntary and can leave when I want."  She did not like her last inpatient at [facility] and demands placement at [facility], which is currently full.  [name] wants to leave and follow up with her outpatient provider-[name].  No suicidal/homicidal ideations, hallucinations, or substance abuse.  Reports diagnoses of bipolar disorder and borderline personality.  Lives with mother, stable for discharge.  Past Psychiatric History: bipolar disorder, borderline personality  Risk to Self: None Risk to Others: Homicidal Ideation: No Thoughts of Harm to Others: No Current Homicidal Intent: No Current Homicidal Plan: No Access to Homicidal Means: No Identified Victim: none History of harm to others?: No Assessment of  Violence: None Noted Violent Behavior  Description: n/a Does patient have access to weapons?: No Criminal Charges Pending?: No Does patient have a court date: No Prior Inpatient Therapy: Prior Inpatient Therapy: Yes Prior Therapy Dates: 2016 and other Prior Therapy Facilty/Provider(s): [facility] Reason for Treatment: Bipolar/SI Prior Outpatient Therapy: Prior Outpatient Therapy: Yes Prior Therapy Dates: current Prior Therapy Facilty/Provider(s): Ruben Reason and Dr. Pauline [name] Reason for Treatment: bipolar/med management Does patient have an ACCT team?: No Does patient have Intensive In-House Services?  : No Does patient have [facility] services? : No Does patient have P4CC services?: No  Past Medical History:  Past Medical History:  Diagnosis Date  . Achilles tendinitis   . Achilles tendinitis   . ADHD (attention deficit hyperactivity disorder)   . Allergy   . Arthritis   . Bipolar affective ([place])   . Bipolar disorder ([place])   . Eczema   . Ganglion cyst 09/29/2009   left wrist (2 cyst)  . Hyperprolactinemia ([place])   . Hypertension   . Lipoma   . Migraine   . Personality disorder   . Pes planus     Past Surgical History:  Procedure Laterality Date  . ANKLE SURGERY  12/88   left   . chest nodule  1990?   rt chest wall nodule removal  . GANGLION CYST EXCISION  2011  . lipoma removal    . right bunioectomy    . SHOULDER SURGERY  01/13/2011   right, partial tear  . tumor resection left thigh     Family History:  Family History  Problem Relation Age of Onset  . Hypertension Mother   . Hyperlipidemia Mother   . Heart attack Father   . Heart disease Father   . Hypertension Father   . Bipolar disorder Father   . Diabetes Paternal Grandfather   . Heart disease Maternal Aunt   . Breast cancer Maternal Aunt   . Heart disease Maternal Grandmother   . Cancer Maternal Grandmother        colon   Family Psychiatric  History:  Social History:  History  Alcohol Use No     History  Drug Use No    Social  History   Social History  . Marital status: Single    Spouse name: N/A  . Number of children: 0  . Years of education: N/A   Occupational History  .  [employer]   Social History Main Topics  . Smoking status: Never Smoker  . Smokeless tobacco: Never Used  . Alcohol use No  . Drug use: No  . Sexual activity: No   Other Topics Concern  . None   Social History Narrative   Caffeine  2 sodas daily, 1 cup coffee daily.   Additional Social History:    Allergies:   Allergies  Allergen Reactions  . Adhesive [Tape] Itching and Rash    Also reacted to Steri Strips and Band-Aids.  . Dilaudid [Hydromorphone Hcl] Itching  . Morphine Nausea And Vomiting  . Penicillins Hives    Has patient had a PCN reaction causing immediate rash, facial/tongue/throat swelling, SOB or lightheadedness with hypotension: YES Has patient had a PCN reaction causing severe rash involving mucus membranes or skin necrosis: NO Has patient had a PCN reaction that required hospitalization NO Has patient had a PCN reaction occurring within the last 10 years:NO If all of the above answers are "NO", then may proceed with Cephalosporin use.  Marland Kitchen Percocet [Oxycodone-Acetaminophen] Itching  . Prednisone  Hives  . Provera [Medroxyprogesterone Acetate] Other (See Comments)    Causes manic episodes  . Ultram [Tramadol Hcl] Itching    Labs:  Results for orders placed or performed during the hospital encounter of 07/15/16 (from the past 48 hour(s))  Rapid urine drug screen (hospital performed)     Status: None   Collection Time: 07/15/16  7:18 PM  Result Value Ref Range   Opiates NONE DETECTED NONE DETECTED   Cocaine NONE DETECTED NONE DETECTED   Benzodiazepines NONE DETECTED NONE DETECTED   Amphetamines NONE DETECTED NONE DETECTED   Tetrahydrocannabinol NONE DETECTED NONE DETECTED   Barbiturates NONE DETECTED NONE DETECTED    Comment:        DRUG SCREEN FOR MEDICAL PURPOSES ONLY.  IF CONFIRMATION IS NEEDED FOR ANY  PURPOSE, NOTIFY LAB WITHIN 5 DAYS.        LOWEST DETECTABLE LIMITS FOR URINE DRUG SCREEN Drug Class       Cutoff (ng/mL) Amphetamine      1000 Barbiturate      200 Benzodiazepine   828 Tricyclics       003 Opiates          300 Cocaine          300 THC              50   Urinalysis, Routine w reflex microscopic     Status: Abnormal   Collection Time: 07/15/16  7:18 PM  Result Value Ref Range   Color, Urine YELLOW YELLOW   APPearance CLOUDY (A) CLEAR   Specific Gravity, Urine 1.025 1.005 - 1.030   pH 6.0 5.0 - 8.0   Glucose, UA NEGATIVE NEGATIVE mg/dL   Hgb urine dipstick SMALL (A) NEGATIVE   Bilirubin Urine NEGATIVE NEGATIVE   Ketones, ur NEGATIVE NEGATIVE mg/dL   Protein, ur NEGATIVE NEGATIVE mg/dL   Nitrite NEGATIVE NEGATIVE   Leukocytes, UA TRACE (A) NEGATIVE  Pregnancy, urine     Status: None   Collection Time: 07/15/16  7:18 PM  Result Value Ref Range   Preg Test, Ur NEGATIVE NEGATIVE    Comment:        THE SENSITIVITY OF THIS METHODOLOGY IS >20 mIU/mL.   Urinalysis, Microscopic (reflex)     Status: Abnormal   Collection Time: 07/15/16  7:18 PM  Result Value Ref Range   RBC / HPF 6-30 0 - 5 RBC/hpf   WBC, UA 6-30 0 - 5 WBC/hpf   Bacteria, UA FEW (A) NONE SEEN   Squamous Epithelial / LPF 6-30 (A) NONE SEEN  Comprehensive metabolic panel     Status: Abnormal   Collection Time: 07/15/16  9:17 PM  Result Value Ref Range   Sodium 139 135 - 145 mmol/L   Potassium 4.1 3.5 - 5.1 mmol/L   Chloride 104 101 - 111 mmol/L   CO2 25 22 - 32 mmol/L   Glucose, Bld 116 (H) 65 - 99 mg/dL   BUN 13 6 - 20 mg/dL   Creatinine, Ser 1.08 (H) 0.44 - 1.00 mg/dL   Calcium 9.4 8.9 - 10.3 mg/dL   Total Protein 6.0 (L) 6.5 - 8.1 g/dL   Albumin 3.7 3.5 - 5.0 g/dL   AST 25 15 - 41 U/L   ALT 16 14 - 54 U/L   Alkaline Phosphatase 60 38 - 126 U/L   Total Bilirubin 0.4 0.3 - 1.2 mg/dL   GFR calc non Af Amer >60 >60 mL/min   GFR calc Af Amer >60 >60  mL/min    Comment: (NOTE) The eGFR has  been calculated using the CKD EPI equation. This calculation has not been validated in all clinical situations. eGFR's persistently <60 mL/min signify possible Chronic Kidney Disease.    Anion gap 10 5 - 15  Ethanol     Status: None   Collection Time: 07/15/16  9:17 PM  Result Value Ref Range   Alcohol, Ethyl (B) <5 <5 mg/dL    Comment:        LOWEST DETECTABLE LIMIT FOR SERUM ALCOHOL IS 5 mg/dL FOR MEDICAL PURPOSES ONLY   Salicylate level     Status: None   Collection Time: 07/15/16  9:17 PM  Result Value Ref Range   Salicylate Lvl <9.3 2.8 - 30.0 mg/dL  Acetaminophen level     Status: Abnormal   Collection Time: 07/15/16  9:17 PM  Result Value Ref Range   Acetaminophen (Tylenol), Serum <10 (L) 10 - 30 ug/mL    Comment:        THERAPEUTIC CONCENTRATIONS VARY SIGNIFICANTLY. A RANGE OF 10-30 ug/mL MAY BE AN EFFECTIVE CONCENTRATION FOR MANY PATIENTS. HOWEVER, SOME ARE BEST TREATED AT CONCENTRATIONS OUTSIDE THIS RANGE. ACETAMINOPHEN CONCENTRATIONS >150 ug/mL AT 4 HOURS AFTER INGESTION AND >50 ug/mL AT 12 HOURS AFTER INGESTION ARE OFTEN ASSOCIATED WITH TOXIC REACTIONS.   cbc     Status: None   Collection Time: 07/15/16  9:17 PM  Result Value Ref Range   WBC 6.3 4.0 - 10.5 K/uL   RBC 4.35 3.87 - 5.11 MIL/uL   Hemoglobin 13.4 12.0 - 15.0 g/dL   HCT 40.6 36.0 - 46.0 %   MCV 93.3 78.0 - 100.0 fL   MCH 30.8 26.0 - 34.0 pg   MCHC 33.0 30.0 - 36.0 g/dL   RDW 12.5 11.5 - 15.5 %   Platelets 203 150 - 400 K/uL    Current Facility-Administered Medications  Medication Dose Route Frequency Provider Last Rate Last Dose  . divalproex (DEPAKOTE ER) 24 hr tablet 1,500 mg  1,500 mg Oral QHS Forde Dandy, MD   1,500 mg at 07/15/16 2302  . hydrOXYzine (ATARAX/VISTARIL) tablet 50 mg  50 mg Oral TID PRN Forde Dandy, MD   50 mg at 07/15/16 2304  . ibuprofen (ADVIL,MOTRIN) tablet 600 mg  600 mg Oral Q8H PRN Forde Dandy, MD      . lamoTRIgine (LAMICTAL) tablet 300 mg  300 mg Oral QHS  Forde Dandy, MD   300 mg at 07/15/16 2303  . lurasidone (LATUDA) tablet 120 mg  120 mg Oral QPM Forde Dandy, MD   120 mg at 07/15/16 2303  . metoprolol succinate (TOPROL-XL) 24 hr tablet 100 mg  100 mg Oral Daily Forde Dandy, MD      . multivitamin with minerals tablet 1 tablet  1 tablet Oral Daily Forde Dandy, MD      . ondansetron [facility]) tablet 4 mg  4 mg Oral Q8H PRN Forde Dandy, MD      . rOPINIRole (REQUIP) tablet 2 mg  2 mg Oral TID Forde Dandy, MD   2 mg at 07/15/16 2304   Current Outpatient Prescriptions  Medication Sig Dispense Refill  . azelastine (ASTELIN) 0.1 % nasal spray instill 1 to 2 sprays into each nostril twice a day  0  . divalproex (DEPAKOTE ER) 500 MG 24 hr tablet take 3 tablets by mouth at bedtime 90 tablet 1  . hydrocortisone cream 0.5 % Apply 1 application  topically daily as needed for itching.    . hydrOXYzine (VISTARIL) 50 MG capsule take 1 capsule by mouth twice a day and 1 capsule at bedtime  0  . lamoTRIgine (LAMICTAL) 200 MG tablet Take 1 tablet (200 mg total) by mouth at bedtime. For mood stabilization (Patient taking differently: Take 300 mg by mouth at bedtime. For mood stabilization) 30 tablet 0  . Lurasidone HCl 120 MG TABS Take 1 tablet by mouth every evening.    . metaxalone (SKELAXIN) 800 MG tablet Take 1 tablet (800 mg total) by mouth 3 (three) times daily as needed for muscle spasms. 60 tablet 1  . metoprolol succinate (TOPROL-XL) 100 MG 24 hr tablet Take 1 tablet (100 mg total) by mouth daily. Take with or immediately following a meal for blood pressure control. 30 tablet 0  . Multiple Vitamin (MULTIVITAMIN WITH MINERALS) TABS tablet Take 1 tablet by mouth daily. For nutritional supplementation. 30 tablet 0  . ondansetron (ZOFRAN) 4 MG tablet Take 1 tablet (4 mg total) by mouth every 8 (eight) hours as needed for nausea or vomiting. 20 tablet 0  . Polyvinyl Alcohol-Povidone (REFRESH OP) Apply 1 tablet to eye daily as needed. For dry eyes     . promethazine (PHENERGAN) 25 MG tablet Take 1 tablet (25 mg total) by mouth every 6 (six) hours as needed for nausea or vomiting (or headache). 12 tablet 0  . rOPINIRole (REQUIP) 2 MG tablet     . sodium chloride 0.9 % SOLN 100 mL with valproate 500 MG/5ML SOLN 500 mg Inject 500 mg into the vein once as needed. To be infused over  a 15 minutes period, repeat once if partial relief.    . sodium chloride 0.9 % SOLN 100 mL with valproate 500 MG/5ML SOLN 500 mg Inject 500 mg into the vein once as needed. To be infused over  a 15 minutes period, repeat once if partial relief. 1000 mg 1  . SUMAtriptan 6 MG/0.5ML SOAJ Inject 6 mg into the skin as needed. 4 Cartridge 5  . zolpidem (AMBIEN) 10 MG tablet Take 10 mg by mouth at bedtime.    . meloxicam (MOBIC) 15 MG tablet Take 1 tablet (15 mg total) by mouth daily. (Patient not taking: Reported on 07/15/2016) 30 tablet 2    Musculoskeletal: Strength & Muscle Tone: within normal limits Gait & Station: normal Patient leans: N/A  Psychiatric Specialty Exam: Physical Exam  Constitutional: She is oriented to person, place, and time. She appears well-developed and well-nourished.  HENT:  Head: Normocephalic.  Respiratory: Effort normal.  Musculoskeletal: Normal range of motion.  Neurological: She is alert and oriented to person, place, and time.  Psychiatric: Her speech is normal and behavior is normal. Judgment and thought content normal. Cognition and memory are normal. She exhibits a depressed mood.    Review of Systems  Psychiatric/Behavioral: Positive for depression.  All other systems reviewed and are negative.   Blood pressure 100/60, pulse 79, temperature 99.6 F (37.6 C), temperature source Oral, resp. rate 16, height _0  (1.778 m), weight 121.1 kg (267 lb), last menstrual period 07/08/2016, SpO2 98 %.Body mass index is 38.31 kg/m.  General Appearance: Casual  Eye Contact:  [name]  Speech:  Normal Rate  Volume:  Normal  Mood:   Depressed, mild  Affect:  Congruent  Thought Process:  Coherent and Descriptions of Associations: Intact  Orientation:  Full (Time, Place, and Person)  Thought Content:  WDL and Logical  Suicidal Thoughts:  No  Homicidal Thoughts:  No  Memory:  Immediate;   [name] Recent;   [name] Remote;   [name]  Judgement:  Fair  Insight:  Fair  Psychomotor Activity:  Normal  Concentration:  Concentration: [name] and Attention Span: [name]  Recall:  [name]  Fund of Knowledge:  [name]  Language:  [name]  Akathisia:  No  Handed:  Right  AIMS (if indicated):     Assets:  Housing Leisure Time Physical Health Resilience Social Support  ADL's:  Intact  Cognition:  WNL  Sleep:        Treatment Plan Summary: Daily contact with patient to assess and evaluate symptoms and progress in treatment, Medication management and Plan bipolar affective disorder, depressed, severe withou psychosis:  -Crisis stabilization -Medication management:  Continued Depakote 1500 mg at bedtime for mood stabilization, Vistaril 50 mg TID PRN anxiety, Lamictal 300 mg at bedtime for mood stabilization, and Latuda 120 mg at bedtime for depression and mood stabilization along with medical medications  -Individual counseling  Disposition: Discharge home  Waylan Boga, NP 07/16/2016 9:26 AM  Patient seen face-to-face for psychiatric evaluation, chart reviewed and case discussed with the physician extender and developed treatment plan. Reviewed the information documented and agree with the treatment plan. Corena Pilgrim, MD

## 2016-07-16 NOTE — ED Notes (Signed)
Pt sleeping at present, no distress noted, calm & cooperative.  Monitoring for safety, Q 15 min checks in effect. 

## 2016-07-17 ENCOUNTER — Encounter (HOSPITAL_COMMUNITY): Payer: Self-pay | Admitting: *Deleted

## 2016-07-17 ENCOUNTER — Inpatient Hospital Stay (HOSPITAL_COMMUNITY)
Admission: RE | Admit: 2016-07-17 | Discharge: 2016-07-25 | DRG: 885 | Disposition: A | Discharge status: Home / Self Care | Payer: PPO | Attending: Psychiatry | Admitting: Psychiatry

## 2016-07-17 DIAGNOSIS — R110 Nausea: Secondary | ICD-10-CM | POA: Diagnosis not present

## 2016-07-17 DIAGNOSIS — F900 Attention-deficit hyperactivity disorder, predominantly inattentive type: Secondary | ICD-10-CM | POA: Diagnosis not present

## 2016-07-17 DIAGNOSIS — Z79899 Other long term (current) drug therapy: Secondary | ICD-10-CM

## 2016-07-17 DIAGNOSIS — R45851 Suicidal ideations: Secondary | ICD-10-CM | POA: Diagnosis not present

## 2016-07-17 DIAGNOSIS — F3130 Bipolar disorder, current episode depressed, mild or moderate severity, unspecified: Secondary | ICD-10-CM | POA: Diagnosis not present

## 2016-07-17 DIAGNOSIS — G4700 Insomnia, unspecified: Secondary | ICD-10-CM | POA: Diagnosis present

## 2016-07-17 DIAGNOSIS — F603 Borderline personality disorder: Secondary | ICD-10-CM | POA: Diagnosis not present

## 2016-07-17 DIAGNOSIS — I10 Essential (primary) hypertension: Secondary | ICD-10-CM | POA: Diagnosis present

## 2016-07-17 DIAGNOSIS — F39 Unspecified mood [affective] disorder: Secondary | ICD-10-CM | POA: Diagnosis not present

## 2016-07-17 DIAGNOSIS — F319 Bipolar disorder, unspecified: Secondary | ICD-10-CM | POA: Diagnosis present

## 2016-07-17 DIAGNOSIS — Z818 Family history of other mental and behavioral disorders: Secondary | ICD-10-CM

## 2016-07-17 DIAGNOSIS — Z915 Personal history of self-harm: Secondary | ICD-10-CM | POA: Diagnosis not present

## 2016-07-17 DIAGNOSIS — R413 Other amnesia: Secondary | ICD-10-CM | POA: Diagnosis not present

## 2016-07-17 HISTORY — DX: Unspecified cataract: H26.9

## 2016-07-17 LAB — URINE CULTURE

## 2016-07-17 MED ORDER — LURASIDONE HCL 80 MG PO TABS
80.0000 mg | ORAL_TABLET | Freq: Every day | ORAL | Status: DC
Start: 2016-07-17 — End: 2016-07-25
  Administered 2016-07-17 – 2016-07-24 (×8): 80 mg via ORAL
  Filled 2016-07-17 (×11): qty 1

## 2016-07-17 MED ORDER — TRAZODONE HCL 50 MG PO TABS
50.0000 mg | ORAL_TABLET | Freq: Every evening | ORAL | Status: DC | PRN
  Administered 2016-07-17 – 2016-07-20 (×6): 50 mg via ORAL
  Filled 2016-07-17 (×10): qty 1

## 2016-07-17 MED ORDER — SUMATRIPTAN SUCCINATE 50 MG PO TABS
50.0000 mg | ORAL_TABLET | Freq: Two times a day (BID) | ORAL | Status: DC | PRN
  Administered 2016-07-17 – 2016-07-18 (×2): 50 mg via ORAL
  Filled 2016-07-17 (×2): qty 1

## 2016-07-17 MED ORDER — LAMOTRIGINE 200 MG PO TABS
200.0000 mg | ORAL_TABLET | Freq: Every day | ORAL | Status: DC
  Administered 2016-07-17 – 2016-07-24 (×8): 200 mg via ORAL
  Filled 2016-07-17 (×6): qty 1
  Filled 2016-07-17: qty 2
  Filled 2016-07-17: qty 1
  Filled 2016-07-17: qty 2
  Filled 2016-07-17 (×3): qty 1

## 2016-07-17 MED ORDER — HYDROXYZINE HCL 50 MG PO TABS
50.0000 mg | ORAL_TABLET | Freq: Three times a day (TID) | ORAL | Status: DC | PRN
  Administered 2016-07-17 – 2016-07-25 (×13): 50 mg via ORAL
  Filled 2016-07-17 (×13): qty 1

## 2016-07-17 MED ORDER — DIVALPROEX SODIUM ER 500 MG PO TB24
1500.0000 mg | ORAL_TABLET | Freq: Every day | ORAL | Status: DC
  Administered 2016-07-17 – 2016-07-24 (×8): 1500 mg via ORAL
  Filled 2016-07-17 (×11): qty 3

## 2016-07-17 MED ORDER — METOPROLOL SUCCINATE ER 100 MG PO TB24
100.0000 mg | ORAL_TABLET | Freq: Every day | ORAL | Status: DC
  Administered 2016-07-18 – 2016-07-25 (×8): 100 mg via ORAL
  Filled 2016-07-17 (×12): qty 1

## 2016-07-17 MED ORDER — LAMOTRIGINE 100 MG PO TABS
300.0000 mg | ORAL_TABLET | Freq: Every day | ORAL | Status: DC
  Filled 2016-07-17: qty 3

## 2016-07-17 NOTE — BH Assessment (Signed)
Tele Assessment Note   [name] is an 46 y.o. female presenting to [facility] with complaints of suicidal thought with intent and plan to stab herself or run her car into a tree. Describes worsening depression over the last several weeks. Describes hyper awareness of knives and trees when she's in close proximity to them. States she's has decreased sleep and appetite, and depressed mood. The patient increased therapy appointments with Ruben Reason her therapist over the last several weeks. Pauline Good, FNP recently increased her Lamictal and vistaril. The patient is compliant with medications.  The patient denies HI or A/V. Denies drug use. She works part-time at [employer] States she's unable to work full-time due her mental health issues. Currently lives with her mother.   The patient had good eye contact, freedom of movement, was alert, had depressed mood, and affect, moderate anxiety, was oriented, had poor judgment and insight.   Zerita Boers NP recommends inpatient psychiatric treatment   Diagnosis: Bipolar I disorder,  most recent episode depressed, severe: Borderline personality disorder  Past Medical History:  Past Medical History:  Diagnosis Date  . Achilles tendinitis   . Achilles tendinitis   . ADHD (attention deficit hyperactivity disorder)   . Allergy   . Arthritis   . Bipolar affective ([place])   . Bipolar disorder ([place])   . Eczema   . Ganglion cyst 09/29/2009   left wrist (2 cyst)  . Hyperprolactinemia ([place])   . Hypertension   . Lipoma   . Migraine   . Personality disorder   . Pes planus     Past Surgical History:  Procedure Laterality Date  . ANKLE SURGERY  12/88   left   . chest nodule  1990?   rt chest wall nodule removal  . GANGLION CYST EXCISION  2011  . lipoma removal    . right bunioectomy    . SHOULDER SURGERY  01/13/2011   right, partial tear  . tumor resection left thigh      Family History:  Family History  Problem Relation Age of  Onset  . Hypertension Mother   . Hyperlipidemia Mother   . Heart attack Father   . Heart disease Father   . Hypertension Father   . Bipolar disorder Father   . Diabetes Paternal Grandfather   . Heart disease Maternal Aunt   . Breast cancer Maternal Aunt   . Heart disease Maternal Grandmother   . Cancer Maternal Grandmother        colon    Social History:  reports that she has never smoked. She has never used smokeless tobacco. She reports that she does not drink alcohol or use drugs.  Additional Social History:  Alcohol / Drug Use Pain Medications: see MAR Prescriptions: see MAR Over the Counter: see MAR History of alcohol / drug use?: No history of alcohol / drug abuse  CIWA:   COWS:    PATIENT STRENGTHS: (choose at least two) Average or above average intelligence General fund of knowledge  Allergies:  Allergies  Allergen Reactions  . Adhesive [Tape] Itching and Rash    Also reacted to Steri Strips and Band-Aids.  . Dilaudid [Hydromorphone Hcl] Itching  . Morphine Nausea And Vomiting  . Penicillins Hives    Has patient had a PCN reaction causing immediate rash, facial/tongue/throat swelling, SOB or lightheadedness with hypotension: YES Has patient had a PCN reaction causing severe rash involving mucus membranes or skin necrosis: NO Has patient had a PCN reaction that  required hospitalization NO Has patient had a PCN reaction occurring within the last 10 years:NO If all of the above answers are "NO", then may proceed with Cephalosporin use.  Marland Kitchen Percocet [Oxycodone-Acetaminophen] Itching  . Prednisone Hives  . Provera [Medroxyprogesterone Acetate] Other (See Comments)    Causes manic episodes  . Ultram [Tramadol Hcl] Itching    Home Medications:  No prescriptions prior to admission.    OB/GYN Status:  Patient's last menstrual period was 07/08/2016 (approximate).  General Assessment Data Location of Assessment: [facility] Assessment Services TTS Assessment: In  system Is this a Tele or Face-to-Face Assessment?: Face-to-Face Is this an Initial Assessment or a Re-assessment for this encounter?: Initial Assessment Marital status: Single Is patient pregnant?: No Pregnancy Status: No Living Arrangements: Parent Can pt return to current living arrangement?: Yes Admission Status: Voluntary Is patient capable of signing voluntary admission?: Yes Referral Source: Self/Family/Friend Insurance type: healthteam advantage  Medical Screening Exam ([place]) Medical Exam completed: Yes  Crisis Care Plan Living Arrangements: Parent Name of Psychiatrist: Pauline Good Name of Therapist: Ruben Reason  Education Status Is patient currently in school?: No Current Grade: n/a 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 completed: BS degree Name of school: n/a Contact person: mother  Risk to self with the past 6 months Suicidal Ideation: Yes-Currently Present Has patient been a risk to self within the past 6 months prior to admission? : Yes Suicidal Intent: Yes-Currently Present Has patient had any suicidal intent within the past 6 months prior to admission? : Yes Is patient at risk for suicide?: Yes Suicidal Plan?: Yes-Currently Present Has patient had any suicidal plan within the past 6 months prior to admission? : Yes Specify Current Suicidal Plan: cut self/run car into a tree Access to Means: Yes Specify Access to Suicidal Means: access to knives, has a car What has been your use of drugs/alcohol within the last 12 months?: n/a Previous Attempts/Gestures: Yes How many times?: 3 Other Self Harm Risks: n/a Triggers for Past Attempts: None known Intentional Self Injurious Behavior: None Family Suicide History: No Recent stressful life event(s): Other (Comment) (ongoing mental health issues) Persecutory voices/beliefs?: No Depression: Yes Depression Symptoms: Insomnia, Tearfulness, Loss of interest in usual pleasures, Feeling worthless/self  pity Substance abuse history and/or treatment for substance abuse?: No Suicide prevention information given to non-admitted patients: Not applicable  Risk to Others within the past 6 months Homicidal Ideation: No Does patient have any lifetime risk of violence toward others beyond the six months prior to admission? : No Thoughts of Harm to Others: No Current Homicidal Intent: No Current Homicidal Plan: No Access to Homicidal Means: No Identified Victim: none History of harm to others?: No Assessment of Violence: None Noted Violent Behavior Description: n/a Does patient have access to weapons?: No Criminal Charges Pending?: No Does patient have a court date: No Is patient on probation?: No  Psychosis Hallucinations: None noted Delusions: None noted  Mental Status Report Appearance/Hygiene: Unremarkable Eye Contact: Good Motor Activity: Freedom of movement Speech: Logical/coherent Level of Consciousness: Alert Mood: Depressed Affect: Appropriate to circumstance Anxiety Level: Moderate Thought Processes: Relevant Judgement: Impaired Orientation: Person, Place, Time, Situation Obsessive Compulsive Thoughts/Behaviors: Moderate  Cognitive Functioning Concentration: Decreased Memory: Recent Intact, Remote Intact IQ: Average Insight: Poor Impulse Control: Fair Appetite: Fair Weight Loss: 0 Weight Gain: 0 Sleep: Decreased Total Hours of Sleep: 4 Vegetative Symptoms: None  ADLScreening [facility] Assessment Services) Patient's cognitive ability adequate to safely complete daily activities?: Yes Patient able to express need for  assistance with ADLs?: Yes Independently performs ADLs?: Yes (appropriate for developmental age)  Prior Inpatient Therapy Prior Inpatient Therapy: Yes Prior Therapy Dates: 2016 and other Prior Therapy Facilty/Provider(s): [facility] Reason for Treatment: Bipolar/SI  Prior Outpatient Therapy Prior Outpatient Therapy: Yes Prior Therapy Dates:  current Prior Therapy Facilty/Provider(s): Ruben Reason & Dr. Pauline Good Reason for Treatment: bipolar Does patient have an ACCT team?: No Does patient have Intensive In-House Services?  : No Does patient have [facility] services? : No Does patient have P4CC services?: No  ADL Screening (condition at time of admission) Patient's cognitive ability adequate to safely complete daily activities?: Yes Is the patient deaf or have difficulty hearing?: No Does the patient have difficulty seeing, even when wearing glasses/contacts?: No Does the patient have difficulty concentrating, remembering, or making decisions?: No Patient able to express need for assistance with ADLs?: Yes Does the patient have difficulty dressing or bathing?: No Independently performs ADLs?: Yes (appropriate for developmental age)       Abuse/Neglect Assessment (Assessment to be complete while patient is alone) Physical Abuse: Denies Verbal Abuse: Yes, past (Comment) Sexual Abuse: Denies     Advance Directives (For Healthcare) Does Patient Have a Medical Advance Directive?: No    Additional Information 1:1 In Past 12 Months?: No CIRT Risk: No Elopement Risk: No Does patient have medical clearance?: Yes     Disposition:  Disposition Initial Assessment Completed for this Encounter: Yes Disposition of Patient: Inpatient treatment program Type of inpatient treatment program: Adult  Mollie Germany 07/17/2016 2:25 PM

## 2016-07-17 NOTE — Progress Notes (Signed)
Assumed care of patient from admitting RN, Estill Bamberg. Patient has required frequent support from this writer as she views her admission as a negative event. "I just keep thinking, I screwed up again. My mom is frustrated with me. She thinks I'm lazy." Patient reports anxiety and feeling "shaky" and "cloudy in her thinking", though patient is able to articulate feelings coherently. States she left the ED on Friday because [facility] had no bed and [place] "[place]." Complaining of a headache of a 7/10 and reports she has not eaten all day.  Patient offered emotional support and time spent 1:1 with this Probation officer. Discussed coping skills and how to reverse negative thought patterns. Vistaril given for anxiety, imitrex po for headache. Fall precautions initiated as patient reports frequent falls PTA. Fall prevention plan discussed with patient.  Patient verbalizes understanding of information and education provided. Receptive to suggestions by this Probation officer. Reports vistaril helpful and on reassess of pain, patient states is asleep. Will continue level III obs. Patient remains safe denying SI/HI at this time.

## 2016-07-17 NOTE — Progress Notes (Signed)
Patient ID: [name], female   DOB: 1971/01/16, 46 y.o.   MRN: [id number]  Patient came as a walkin today.  Patient went to [facility] yesterday for SI, but left b/c they were not going to send her here.  She came today c/o SI and sadness.  Patient lives with her mother and works at [employer].  Patient see Dr. Radene Ou at [facility] [address] here in [place].  Patient was seen there in May and is suppose to return in July.  Patient goes to outpatient therapy 1-2 times a week.  Patient sts she can't stop thinking about all of the suicides that have happened in the news lately and she has been thinking about suicide for several days but denies a plan.  Patient sts her PCP just increased a couple of her medications.  Patient has been seen at this facility many times in the past.

## 2016-07-17 NOTE — Tx Team (Signed)
Initial Treatment Plan 07/17/2016 3:34 PM [name] TRV:[id number]    PATIENT STRESSORS: Health problems Medication change or noncompliance   PATIENT STRENGTHS: Motivation for treatment/growth Religious Affiliation Supportive family/friends   PATIENT IDENTIFIED PROBLEMS: depression  "need coping skills"  "get my medication right"                 DISCHARGE CRITERIA:  Medical problems require only outpatient monitoring Verbal commitment to aftercare and medication compliance  PRELIMINARY DISCHARGE PLAN: Outpatient therapy Return to previous living arrangement  PATIENT/FAMILY INVOLVEMENT: This treatment plan has been presented to and reviewed with the patient, [name].The patient and family have been given the opportunity to ask questions and make suggestions.  Enzo Bi, RN 07/17/2016, 3:34 PM

## 2016-07-17 NOTE — H&P (Signed)
Behavioral Health Medical Screening Exam  [name] is an 46 y.o. female with a past medical history of Bipolar depression, Borderline Personality disorder and ADHD who came vol to the [facility] accompanied by her mother with c/o persistent suicide ideations with plan to run into a tree. Patient presents in a causal outfit, awake, alert and oriented x4, in a depressed, anxious mood and congruent affect. Patient stated, "I can't take this anymore, I feel like I want to explode. This suicide thoughts can't go away". Patient stated that she has had 3 previous suicide attempts via cutting back in the 1990's. She reports previous psych admissions to the [facility] in 2016. Patient currently endorsing depression, anxiety, suicide ideations with plan, patient denies HI/VAH.  Total Time spent with patient: 30 minutes  Psychiatric Specialty Exam: Physical Exam  Vitals reviewed. Constitutional: She is oriented to person, place, and time. She appears well-developed and well-nourished.  HENT:  Head: Normocephalic.  Eyes: Pupils are equal, round, and reactive to light.  Neck: Normal range of motion.  Cardiovascular: Normal rate and regular rhythm.   Respiratory: Effort normal and breath sounds normal.  GI: Soft. Bowel sounds are normal.  Genitourinary:  Genitourinary Comments: Deferred  Musculoskeletal: Normal range of motion.  Wearing a sling to the left forearm  Neurological: She is alert and oriented to person, place, and time.  Skin: Skin is warm and dry.    Review of Systems  Psychiatric/Behavioral: Positive for depression and suicidal ideas (with plan to run into a tree). Negative for hallucinations, memory loss and substance abuse. The patient is nervous/anxious and has insomnia.   All other systems reviewed and are negative.   Blood pressure (!) 141/59, pulse 76, temperature 99.1 F (37.3 C), temperature source Oral, resp. rate 18, last menstrual period 07/08/2016, SpO2 100 %.There is no height or  weight on file to calculate BMI.  General Appearance: unremakable  Eye Contact:  Good  Speech:  Clear and Coherent and Normal Rate  Volume:  Normal  Mood:  Depressed  Affect:  Congruent  Thought Process:  Coherent and Goal Directed  Orientation:  Full (Time, Place, and Person)  Thought Content:  WDL and Logical  Suicidal Thoughts:  Yes.  with intent/plan  Homicidal Thoughts:  No  Memory:  Immediate;   Good Recent;   Good Remote;   Fair  Judgement:  Good  Insight:  Good and Present  Psychomotor Activity:  Normal  Concentration: Concentration: Good and Attention Span: Good  Recall:  Good  Fund of Knowledge:Good  Language: Good  Akathisia:  Negative  Handed:  Right  AIMS (if indicated):     Assets:  Communication Skills Desire for Improvement Financial Resources/Insurance Housing Physical Health Resilience Talents/Skills  Sleep:       Musculoskeletal: Strength & Muscle Tone: within normal limits Gait & Station: normal Patient leans: N/A  Blood pressure (!) 141/59, pulse 76, temperature 99.1 F (37.3 C), temperature source Oral, resp. rate 18, last menstrual period 07/08/2016, SpO2 100 %.  Recommendations: Based on my evaluation the patient appears to have an emergency medical condition. Patient to be admitted to inpatient behavioral hospital for mediation management and stablilization.   Vicenta Aly, NP 07/17/2016, 2:41 PM   Agree with NP assessment as above

## 2016-07-17 NOTE — Progress Notes (Signed)
Nursing Progress Note [id number]  D) Patient presents with flat affect and anxious mood. Patient was sleeping at start of shift and did not attend group. Patient woke up for snacks and reported "I had a migraine earlier, I think the medicine made me sleepy". Patient reported anxiety and requested ambien stating "I always take that and if I don't I will be climbing the walls all night". PA Spencer notified. New orders received, patient medicated as prescribed. Patient reports "my neighbor's husband killed himself and this was our first Father's Day without him so it's been hard. I haven't been here in 2 years, that's my longest stretch". Patient reports passive SI but denies HI/AVH or pain. Patient contracts for safety on the unit.   A) Emotional support given. 1:1 interaction and active listening provided. Patient medicated as prescribed. Medications and plan of care reviewed with patient. Patient verbalized understanding without further questions. Snacks and fluids provided. Opportunities for questions or concerns presented to patient. Patient encouraged to continue to work on treatment goals. Labs, vital signs and patient behavior monitored throughout shift. Patient safety maintained with q15 min safety checks. High fall risk precautions in place and reviewed with patient; patient verbalized understanding.  R) Patient receptive to interaction with nurse. Patient remains safe on the unit at this time. Patient denies any adverse medication reactions at this time. Patient is resting in bed without complaints. Will continue to monitor.

## 2016-07-18 DIAGNOSIS — Z818 Family history of other mental and behavioral disorders: Secondary | ICD-10-CM

## 2016-07-18 DIAGNOSIS — F3130 Bipolar disorder, current episode depressed, mild or moderate severity, unspecified: Principal | ICD-10-CM

## 2016-07-18 DIAGNOSIS — R110 Nausea: Secondary | ICD-10-CM

## 2016-07-18 DIAGNOSIS — R413 Other amnesia: Secondary | ICD-10-CM

## 2016-07-18 LAB — VALPROIC ACID LEVEL: Valproic Acid Lvl: 101 ug/mL — ABNORMAL HIGH (ref 50.0–100.0)

## 2016-07-18 NOTE — Progress Notes (Signed)
D: Pt reports passive SI but contracts for safety this morning. She reports good sleep, fair appetite, low energy level, and good concentration. She rated her depression 7/10, hopelessness 6/10, and anxiety 8/10. She is wearing a brace on her left wrist, which she reports injuring when she fell prior to admission. She said she "just fell."  She reports that her depression was triggered in part by recent celebrity suicides. She wrote on her self inventory that, "I get dizzy and feel like my brain is in a bit of a fog."   A: Meds given as ordered. Q15 safety checks maintained. Support/encouragement offered.  R: Pt remains free from harm and continues with treatment. Will continue to monitor for needs/safety.

## 2016-07-18 NOTE — Plan of Care (Signed)
Problem: Activity: Goal: Sleeping patterns will improve Outcome: Progressing Pt reports sleeping "good." Documented sleep was 6.75 hours.

## 2016-07-18 NOTE — BHH Suicide Risk Assessment (Signed)
[facility] Admission Suicide Risk Assessment   Nursing information obtained from:  Patient Demographic factors:  Caucasian, Low socioeconomic status Current Mental Status:  Suicidal ideation indicated by patient Loss Factors:  NA Historical Factors:  Impulsivity Risk Reduction Factors:  Sense of responsibility to family, Employed  Total Time spent with patient: 45 minutes Principal Problem:  Bipolar Disorder, Depressed  Diagnosis:   Patient Active Problem List   Diagnosis Date Noted  . Bipolar 1 disorder ([place]) [F31.9] 07/17/2016  . Low back pain [M54.5] 06/27/2016  . Left wrist injury, subsequent encounter [S69.92XD] 06/27/2016  . Pain of left thumb [M79.645] 10/07/2015  . 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 Strain of left thumb [IMO0002] 02/11/2015  . Strain of right forearm [S56.911A] 02/11/2015  . Right shoulder pain [M25.511] 12/31/2014  . Injury of right little finger [S69.91XA] 12/31/2014  . Episodic cluster headache, not intractable [G44.019] 12/02/2014  . 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 Hypersomnia, recurrent [G47.13] 12/02/2014  . 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 Lower back injury [S39.92XA] 09/30/2014  . Bipolar I disorder, most recent episode depressed ([place]) [F31.30]   . MDD (major depressive disorder), recurrent, severe, with psychosis ([place]) [F33.3] 09/23/2014  . Injury of left index finger [S69.92XA] 08/20/2014  . Right ankle sprain [S93.401A] 06/01/2014  . Contusion, multiple sites [T07.XXXA] 06/01/2014  . Strain of right gastrocnemius muscle [S86.111A] 06/01/2014  . Phonophobia [F40.298] 05/04/2014  . Photophobia of both eyes [H53.143] 05/04/2014  . 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 Nausea with vomiting [R11.2] 05/04/2014  . Mixed bipolar I disorder ([place]) [F31.60]    . Bipolar I disorder, most recent episode mixed ([place]) [F31.60] 04/18/2014  . Bipolar affective disorder, depressed, mild ([place]) [F31.31] 04/12/2014  . Suicidal ideation [R45.851] 04/12/2014  . Injury of right shoulder and upper arm [S49.91XA] 02/17/2014  . Left leg pain [M79.605] 06/03/2013  . Right hip pain [M25.551] 06/03/2013  . Migraine with status migrainosus [G43.901] 01/08/2013  . Personality disorder [F60.9]   . Chronic migraine [G43.709] 05/08/2012  . Contact dermatitis [L25.9] 11/27/2011  . Major depressive disorder, recurrent episode ([place]) [F33.9] 10/27/2011  . Generalized anxiety disorder [F41.1] 10/27/2011  . 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 Borderline personality disorder [F60.3] 10/27/2011  . Right foot pain [M79.671] 09/28/2011  . 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 Ganglion cyst [M67.40] 09/29/2009  . 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 BIPOLAR DISORDER UNSPECIFIED [F31.9] 06/09/2008    Continued Clinical Symptoms:  Alcohol Use Disorder Identification Test Final Score (AUDIT): 0 The "Alcohol Use Disorders Identification Test", Guidelines for Use in Primary Care, Second Edition.  World Pharmacologist [facility]). Score between 0-7:  no or low risk or alcohol related problems. Score between 8-15:  moderate risk of alcohol related problems. Score between 16-19:  high risk of alcohol related problems. Score 20 or above:  warrants further diagnostic evaluation for alcohol dependence and treatment.   CLINICAL FACTORS:  46 year old single female, employed, long history of mood disorder, has been diagnosed with Bipolar Disorder. Reports worsening depression, suicidal ideations, in spite of medication compliance   Psychiatric Specialty Exam: Physical Exam  ROS  Blood pressure (!) 107/58, pulse 72, temperature 98.6 F (37 C), temperature source  Oral, resp. rate 18, height 5\' 9"  (1.753 m), weight 121.1 kg (267  lb), last menstrual period 07/08/2016, SpO2 100 %.Body mass index is 39.43 kg/m.   see admit note MSE    COGNITIVE FEATURES THAT CONTRIBUTE TO RISK:  Closed-mindedness and Loss of executive function    SUICIDE RISK:   Moderate:  Frequent suicidal ideation with limited intensity, and duration, some specificity in terms of plans, no associated intent, good self-control, limited dysphoria/symptomatology, some risk factors present, and identifiable protective factors, including available and accessible social support.  PLAN OF CARE: Patient will be admitted to inpatient psychiatric unit for stabilization and safety. Will provide and encourage milieu participation. Provide medication management and maked adjustments as needed.  Will follow daily.    I certify that inpatient services furnished can reasonably be expected to improve the patient's condition.   Jenne Campus, MD 07/18/2016, 4:33 PM

## 2016-07-18 NOTE — H&P (Signed)
Psychiatric Admission Assessment Adult  Patient Identification: [name] MRN:  [id number] Date of Evaluation:  07/18/2016 Chief Complaint:   " I am very depressed" Principal Diagnosis: Bipolar Disorder, Depressed  Diagnosis:   Patient Active Problem List   Diagnosis Date Noted  . Bipolar 1 disorder ([place]) [F31.9] 07/17/2016  . Low back pain [M54.5] 06/27/2016  . Left wrist injury, subsequent encounter [S69.92XD] 06/27/2016  . Pain of left thumb [M79.645] 10/07/2015  . 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 Strain of left thumb [IMO0002] 02/11/2015  . Strain of right forearm [S56.911A] 02/11/2015  . Right shoulder pain [M25.511] 12/31/2014  . Injury of right little finger [S69.91XA] 12/31/2014  . Episodic cluster headache, not intractable [G44.019] 12/02/2014  . 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 Hypersomnia, recurrent [G47.13] 12/02/2014  . 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 Lower back injury [S39.92XA] 09/30/2014  . Bipolar I disorder, most recent episode depressed ([place]) [F31.30]   . MDD (major depressive disorder), recurrent, severe, with psychosis ([place]) [F33.3] 09/23/2014  . Injury of left index finger [S69.92XA] 08/20/2014  . Right ankle sprain [S93.401A] 06/01/2014  . Contusion, multiple sites [T07.XXXA] 06/01/2014  . Strain of right gastrocnemius muscle [S86.111A] 06/01/2014  . Phonophobia [F40.298] 05/04/2014  . Photophobia of both eyes [H53.143] 05/04/2014  . 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 Nausea with vomiting [R11.2] 05/04/2014  . Mixed bipolar I disorder ([place]) [F31.60]   . Bipolar I disorder, most recent episode mixed ([place]) [F31.60] 04/18/2014  . Bipolar affective disorder, depressed, mild ([place]) [F31.31] 04/12/2014  . Suicidal ideation [R45.851] 04/12/2014  . Injury  of right shoulder and upper arm [S49.91XA] 02/17/2014  . Left leg pain [M79.605] 06/03/2013  . Right hip pain [M25.551] 06/03/2013  . Migraine with status migrainosus [G43.901] 01/08/2013  . Personality disorder [F60.9]   . Chronic migraine [G43.709] 05/08/2012  . Contact dermatitis [L25.9] 11/27/2011  . Major depressive disorder, recurrent episode ([place]) [F33.9] 10/27/2011  . Generalized anxiety disorder [F41.1] 10/27/2011  . 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 Borderline personality disorder [F60.3] 10/27/2011  . Right foot pain [M79.671] 09/28/2011  . 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 Ganglion cyst [M67.40] 09/29/2009  . 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 BIPOLAR DISORDER UNSPECIFIED [F31.9] 06/09/2008   History of Present Illness: 46 year old female, who is known to our unit from prior psychiatric admission in 2016. She has a long history of mood disorder. States she has been more depressed recently, and has developed suicidal ideations , including thoughts of stabbing self or driving into a tree. She states recent suicides of celebrities that have been covered by the media has been a contributor. States " if they had all that they have and still killed themselves, what about me ".  Also states that one of her friend's husband committed suicide 2 months ago.   Associated Signs/Symptoms: Depression Symptoms:  depressed mood, anhedonia, insomnia, suicidal thoughts with specific plan, loss of energy/fatigue, (Hypo) Manic Symptoms: denies , other than a sense of " racing thoughts "  Anxiety Symptoms:  Reports increased anxiety, a subjective of sense of " dread". Denies panic attacks Psychotic Symptoms:  Denies  PTSD Symptoms: Denies  Total Time spent with patient: 45 minutes  Past Psychiatric History: history of prior psychiatric admissions, most recently  2016. In the past has been diagnosed with Bipolar Disorder, and with Borderline Personality Disorder. History of suicide attempt by walking into traffic, this occurred years ago. Denies history of psychosis, denies history of violence, denies history of PTSD. She states she has had episodes of increased irritability, " mixed episodes " of depression and increased energy and irritability in the past    Is the patient at risk to self? Yes.    Has the patient been a risk to self in the past 6 months? Yes.    Has the patient been a risk to self within the distant past? Yes.    Is the patient a risk to others? No.  Has the patient been a risk to others in the past 6 months? No.  Has the patient been a risk to others within the distant past? No.   Prior Inpatient Therapy: Prior Inpatient Therapy: Yes Prior Therapy Dates: 2016 and other Prior Therapy Facilty/Provider(s): [facility] Reason for Treatment: Bipolar/SI Prior Outpatient Therapy: Prior Outpatient Therapy: Yes Prior Therapy Dates: current Prior Therapy Facilty/Provider(s): [facility] & Dr. Pauline Good Reason for Treatment: bipolar Does patient have an ACCT team?: No Does patient have Intensive In-House Services?  : No Does patient have [facility] services? : No Does patient have P4CC services?: No  Alcohol Screening: Patient refused Alcohol Screening Tool: Yes 1. How often do you have a drink containing alcohol?: Never 9. Have you or someone else been injured as a result of your drinking?: No 10. Has a relative or friend or a doctor or another health worker been concerned about your drinking or suggested you cut down?: No Alcohol Use Disorder Identification Test Final Score (AUDIT): 0 Brief Intervention: AUDIT score less than 7 or less-screening does not suggest unhealthy drinking-brief intervention not indicated Substance Abuse History in the last 12 months: denies alcohol use disorder, denies drug abuse  Consequences of Substance  Abuse: Denies  Previous Psychotropic Medications: Depakote ER 1500 mgrs QHS, Lamictal 300 mgrs QHS, Latuda 120 mgrs QDAY , Ambien 10 mgrs QHS - states this has been her medication regimen for " a few years". Denies medication side effects. Psychological Evaluations:  No  Past Medical History:  Past Medical History:  Diagnosis Date  . Achilles tendinitis   . Achilles tendinitis   . ADHD (attention deficit hyperactivity disorder)   . Allergy   . Arthritis   . Bipolar affective ([place])   . Bipolar disorder ([place])   . Cataracts, bilateral   . Eczema   . Ganglion cyst 09/29/2009   left wrist (2 cyst)  . Hyperprolactinemia ([place])   . Hypertension   . Lipoma   . Migraine   . Personality disorder   . Pes planus     Past Surgical History:  Procedure Laterality Date  . ANKLE SURGERY  12/88   left   . chest nodule  1990?   rt chest wall nodule removal  . GANGLION CYST EXCISION  2011  . lipoma removal    . right bunioectomy    . SHOULDER SURGERY  01/13/2011   right, partial tear  . tumor resection left thigh     Family History:  Lives with mother, states she has little contact with father, one sibling  Family History  Problem Relation Age of Onset  . Hypertension Mother   . Hyperlipidemia Mother   . Heart attack Father   . Heart disease Father   . Hypertension Father   . Bipolar disorder Father   .  Diabetes Paternal Grandfather   . Heart disease Maternal Aunt   . Breast cancer Maternal Aunt   . Heart disease Maternal Grandmother   . Cancer Maternal Grandmother        colon   Family Psychiatric  History:  Father has history of Bipolar Disorder, no suicides in family, no history of substance abuse in family   Tobacco Screening: Have you used any form of tobacco in the last 30 days? (Cigarettes, Smokeless Tobacco, Cigars, and/or Pipes): No Social History: single, no children, no SO at this time, employed, lives with mother , no legal issues  History  Alcohol Use No      History  Drug Use No    Additional Social History: Marital status: Single    Pain Medications: see MAR Prescriptions: see MAR Over the Counter: see MAR History of alcohol / drug use?: No history of alcohol / drug abuse  Allergies:   Allergies  Allergen Reactions  . Adhesive [Tape] Itching and Rash    Also reacted to Steri Strips and Band-Aids.  . Dilaudid [Hydromorphone Hcl] Itching  . Morphine Nausea And Vomiting  . Penicillins Hives    Has patient had a PCN reaction causing immediate rash, facial/tongue/throat swelling, SOB or lightheadedness with hypotension: YES Has patient had a PCN reaction causing severe rash involving mucus membranes or skin necrosis: NO Has patient had a PCN reaction that required hospitalization NO Has patient had a PCN reaction occurring within the last 10 years:NO If all of the above answers are "NO", then may proceed with Cephalosporin use.  Marland Kitchen Percocet [Oxycodone-Acetaminophen] Itching  . Prednisone Hives  . Provera [Medroxyprogesterone Acetate] Other (See Comments)    Causes manic episodes  . Ultram [Tramadol Hcl] Itching   Lab Results:  Results for orders placed or performed during the hospital encounter of 07/17/16 (from the past 48 hour(s))  Valproic acid level     Status: Abnormal   Collection Time: 07/18/16  6:32 AM  Result Value Ref Range   Valproic Acid Lvl 101 (H) 50.0 - 100.0 ug/mL    Comment: Performed at [facility], [place] [address], [place], [place] [address]    Blood Alcohol level:  Lab Results  Component Value Date   [facility] <5 07/15/2016   ETH <5 85/03/7739    Metabolic Disorder Labs:  Lab Results  Component Value Date   HGBA1C 5.4 09/25/2014   MPG 108 09/25/2014   MPG 105 04/14/2014   Lab Results  Component Value Date   PROLACTIN  08/21/2009    14.3 (NOTE)     Reference Ranges:                 Female:                       2.1 -  17.1 ng/ml                 Female:   Pregnant          9.7 - 208.5  ng/mL                           Non Pregnant      2.8 -  29.2 ng/mL                           Post  Menopausal   1.8 -  20.3 ng/mL  PROLACTIN  06/05/2009    19.1 (NOTE)     Reference Ranges:                 Female:                       2.1 -  17.1 ng/ml                 Female:   Pregnant          9.7 - 208.5 ng/mL                           Non Pregnant      2.8 -  29.2 ng/mL                           Post  Menopausal   1.8 -  20.3 ng/mL                     Lab Results  Component Value Date   CHOL 185 09/25/2014   TRIG 171 (H) 09/25/2014   HDL 55 09/25/2014   CHOLHDL 3.4 09/25/2014   VLDL 34 09/25/2014   LDLCALC 96 09/25/2014   LDLCALC 90 04/14/2014    Current Medications: Current Facility-Administered Medications  Medication Dose Route Frequency Provider Last Rate Last Dose  . divalproex (DEPAKOTE ER) 24 hr tablet 1,500 mg  1,500 mg Oral QHS Okonkwo, Justina A, NP   1,500 mg at 07/17/16 2200  . hydrOXYzine (ATARAX/VISTARIL) tablet 50 mg  50 mg Oral TID PRN Lu Duffel, Justina A, NP   50 mg at 07/18/16 1422  . lamoTRIgine (LAMICTAL) tablet 200 mg  200 mg Oral QHS Okonkwo, Justina A, NP   200 mg at 07/17/16 2200  . lurasidone (LATUDA) tablet 80 mg  80 mg Oral Q supper Okonkwo, Justina A, NP   80 mg at 07/17/16 1725  . metoprolol succinate (TOPROL-XL) 24 hr tablet 100 mg  100 mg Oral Daily Okonkwo, Justina A, NP   100 mg at 07/18/16 0815  . SUMAtriptan (IMITREX) tablet 50 mg  50 mg Oral Q12H PRN Okonkwo, Justina A, NP   50 mg at 07/17/16 1751  . traZODone (DESYREL) tablet 50 mg  50 mg Oral QHS,MR X 1 Laverle Hobby, PA-C   50 mg at 07/17/16 2255   PTA Medications: Prescriptions Prior to Admission  Medication Sig Dispense Refill Last Dose  . azelastine (ASTELIN) 0.1 % nasal spray instill 1 to 2 sprays into each nostril twice a day  0 Taking  . divalproex (DEPAKOTE ER) 500 MG 24 hr tablet take 3 tablets by mouth at bedtime 90 tablet 1 07/14/2016 at Unknown time  .  hydrocortisone cream 0.5 % Apply 1 application topically daily as needed for itching.   Taking  . hydrOXYzine (VISTARIL) 50 MG capsule take 1 capsule by mouth twice a day and 1 capsule at bedtime  0 07/14/2016 at Unknown time  . lamoTRIgine (LAMICTAL) 200 MG tablet Take 1 tablet (200 mg total) by mouth at bedtime. For mood stabilization (Patient taking differently: Take 300 mg by mouth at bedtime. For mood stabilization) 30 tablet 0 07/14/2016 at Unknown time  . Lurasidone HCl 120 MG TABS Take 1 tablet by mouth every evening.   07/14/2016 at Unknown time  . meloxicam (MOBIC) 15 MG tablet Take 1 tablet (15 mg total) by mouth daily. (Patient  not taking: Reported on 07/15/2016) 30 tablet 2 Completed Course at Unknown time  . metaxalone (SKELAXIN) 800 MG tablet Take 1 tablet (800 mg total) by mouth 3 (three) times daily as needed for muscle spasms. 60 tablet 1 Taking  . metoprolol succinate (TOPROL-XL) 100 MG 24 hr tablet Take 1 tablet (100 mg total) by mouth daily. Take with or immediately following a meal for blood pressure control. 30 tablet 0 07/15/2016 at Unknown time  . Multiple Vitamin (MULTIVITAMIN WITH MINERALS) TABS tablet Take 1 tablet by mouth daily. For nutritional supplementation. 30 tablet 0 07/15/2016 at Unknown time  . ondansetron (ZOFRAN) 4 MG tablet Take 1 tablet (4 mg total) by mouth every 8 (eight) hours as needed for nausea or vomiting. 20 tablet 0 07/14/2016 at Unknown time  . Polyvinyl Alcohol-Povidone (REFRESH OP) Apply 1 tablet to eye daily as needed. For dry eyes   Taking  . promethazine (PHENERGAN) 25 MG tablet Take 1 tablet (25 mg total) by mouth every 6 (six) hours as needed for nausea or vomiting (or headache). 12 tablet 0 Taking  . rOPINIRole (REQUIP) 2 MG tablet    07/14/2016 at Unknown time  . sodium chloride 0.9 % SOLN 100 mL with valproate 500 MG/5ML SOLN 500 mg Inject 500 mg into the vein once as needed. To be infused over  a 15 minutes period, repeat once if partial relief.    Taking  . sodium chloride 0.9 % SOLN 100 mL with valproate 500 MG/5ML SOLN 500 mg Inject 500 mg into the vein once as needed. To be infused over  a 15 minutes period, repeat once if partial relief. 1000 mg 1   . SUMAtriptan 6 MG/0.5ML SOAJ Inject 6 mg into the skin as needed. 4 Cartridge 5 07/14/2016 at Unknown time  . zolpidem (AMBIEN) 10 MG tablet Take 10 mg by mouth at bedtime.   07/14/2016 at Unknown time    Musculoskeletal: Strength & Muscle Tone: within normal limits Gait & Station: normal Patient leans: N/A  Psychiatric Specialty Exam: Physical Exam  Review of Systems  Constitutional: Negative.   HENT: Negative.   Eyes: Negative.   Respiratory: Negative.   Cardiovascular: Negative.   Gastrointestinal: Positive for nausea. Negative for vomiting.  Genitourinary: Negative.   Musculoskeletal: Negative.   Skin: Negative.   Neurological: Negative for seizures.  Endo/Heme/Allergies: Negative.   Psychiatric/Behavioral: Positive for depression, memory loss and suicidal ideas.  All other systems reviewed and are negative.   Blood pressure (!) 107/58, pulse 72, temperature 98.6 F (37 C), temperature source Oral, resp. rate 18, height 5\' 9"  (1.753 m), weight 121.1 kg (267 lb), last menstrual period 07/08/2016, SpO2 100 %.Body mass index is 39.43 kg/m.  General Appearance: Fairly Groomed  Eye Contact:  Good  Speech:  Normal Rate  Volume:  Decreased  Mood:  Anxious and Depressed  Affect:  constricted, anxious  Thought Process:  Linear and Descriptions of Associations: Intact  Orientation:  Other:  fully alert and attentive  Thought Content:  denies hallucinations, no delusions, not internally preoccupied   Suicidal Thoughts:  denies any active suicidal ideations, but denies plan or intention of hurting herself or of suicide on unit, and contracts for safety on unit  Homicidal Thoughts:  denies any homicidal or violent ideations  Memory:  recent and remote grossly intact    Judgement:  Fair  Insight:  Fair  Psychomotor Activity:  Normal  Concentration:  Concentration: Good and Attention Span: Good  Recall:  [place]iber[place]  of Knowledge:  Good  Language:  Good  Akathisia:  Negative  Handed:  Right  AIMS (if indicated):     Assets:  Resilience Vocational/Educational  ADL's:  Intact  Cognition:  WNL  Sleep:  Number of Hours: 6.75    Treatment Plan Summary: Daily contact with patient to assess and evaluate symptoms and progress in treatment, Medication management, Plan inpatient admission, medications as below and medications as below  Observation Level/Precautions:  15 minute checks  Laboratory:  as needed  Lipid panel, Prolactin, HgbA1C, EKG, TSH  Psychotherapy:  Milieu, group therapy  Medications:  Has been continued on Depakote ER at 1500 mgrs QHS ( Valproic Acid Serum level 101) , Latuda 80 mgrs QDAY, and Lamictal 200 mgrs QDAY . She had been on Ambien prior to admission but describes abnormal sleep related behaviors when taking it such as being up at night and not remembering it. Have D/C d Ambien and is currently on Trazodone   Consultations:  As needed   Discharge Concerns: -    Estimated LOS:5-6 days   Other:     Physician Treatment Plan for Primary Diagnosis: Bipolar Disorder , Depressed   Long Term Goal(s): Improvement in symptoms so as ready for discharge  Short Term Goals: Ability to verbalize feelings will improve, Ability to disclose and discuss suicidal ideas, Ability to demonstrate self-control will improve, Ability to identify and develop effective coping behaviors will improve and Ability to maintain clinical measurements within normal limits will improve  Physician Treatment Plan for Secondary Diagnosis: Active Problems:   Bipolar 1 disorder ([place])  Long Term Goal(s): Improvement in symptoms so as ready for discharge  Short Term Goals: Ability to verbalize feelings will improve, Ability to disclose and discuss suicidal ideas, Ability  to demonstrate self-control will improve, Ability to identify and develop effective coping behaviors will improve, Ability to maintain clinical measurements within normal limits will improve and Compliance with prescribed medications will improve  I certify that inpatient services furnished can reasonably be expected to improve the patient's condition.    Jenne Campus, MD 6/19/20184:03 PM

## 2016-07-18 NOTE — Plan of Care (Signed)
Problem: Safety: Goal: Periods of time without injury will increase Outcome: Progressing Patient contracts for safety on the unit and is on q15 minute safety checks.   

## 2016-07-18 NOTE — Progress Notes (Signed)
Nursing Progress Note [id number]  D) Patient presents calm and cooperative. Patient reports a migraine this evening and requests medication. Patient attended group and is seen interactive with peers in the milieu. Patient provided pictures to color per patient request. Patient reports passive SI but denies HI/AVH or pain. Patient contracts for safety on the unit. Patient reports sleeping well with current regimen.   A) Emotional support given. 1:1 interaction and active listening provided. Patient medicated as prescribed. Medications and plan of care reviewed with patient. Patient verbalized understanding without further questions. Snacks and fluids provided. Opportunities for questions or concerns presented to patient. Patient encouraged to continue to work on treatment goals. Labs, vital signs and patient behavior monitored throughout shift. Patient safety maintained with q15 min safety checks. High fall risk precautions in place and reviewed with patient; patient verbalized understanding.  R) Patient receptive to interaction with nurse. Patient remains safe on the unit at this time. Patient denies any adverse medication reactions at this time. Patient is resting in bed without complaints. Will continue to monitor.
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
## 2016-07-18 NOTE — BHH Group Notes (Signed)
[facility] LCSW Group Therapy 07/18/2016 1:15 PM  Type of Therapy: Group Therapy- Feelings about Diagnosis  Participation Level: Active   Participation Quality:  Appropriate  Affect:  Appropriate  Cognitive: Alert and Oriented   Insight:  Developing   Engagement in Therapy: Developing/Improving and Engaged   Modes of Intervention: Clarification, Confrontation, Discussion, Education, Exploration, Limit-setting, Orientation, Problem-solving, Rapport Building, Art therapist, Socialization and Support  Description of Group:   This group will allow patients to explore their thoughts and feelings about diagnoses they have received. Patients will be guided to explore their level of understanding and acceptance of these diagnoses. Facilitator will encourage patients to process their thoughts and feelings about the reactions of others to their diagnosis, and will guide patients in identifying ways to discuss their diagnosis with significant others in their lives. This group will be process-oriented, with patients participating in exploration of their own experiences as well as giving and receiving support and challenge from other group members.  Summary of Progress/Problems:  Pt states that she struggles with her job. She enjoys working where she does but at times it is also draining to work with people because of her mental health diagnosis.  Therapeutic Modalities:   Cognitive Behavioral Therapy Solution Focused Therapy Motivational Interviewing Relapse Prevention [place], MSW, Latanya Presser [phone] 07/18/2016 4:05 PM

## 2016-07-18 NOTE — Plan of Care (Signed)
Problem: Activity: Goal: Interest or engagement in activities will improve Outcome: Progressing Patient is up interactive with peers in the dayroom. Patient attending group and requested coloring book pages.

## 2016-07-18 NOTE — Progress Notes (Signed)
Pt attend wrap up group. Her day was a 5.5. Her goal was see the doctor today and pt said this took all day. She trying to get her medication correct and want to adjust. When she leaves here she want to be able to cope.

## 2016-07-18 NOTE — Progress Notes (Signed)
Recreation Therapy Notes  Animal-Assisted Activity (AAA) Program Checklist/Progress Notes Patient Eligibility Criteria Checklist & Daily Group note for Rec TxIntervention  Date:07/18/2016 Time:2:55pm Location:400 hall dayroom  AAA/T Program Assumption of Risk Form signed by Patient/ or Parent Legal Guardian Yes  Patient is free of allergies or sever asthmaYes  Patient reports no fear of animals Yes  Patient reports no history of cruelty to animalsYes  Patient understands his/her participation is voluntaryYes  Patient washes hands before animal contact Yes  Patient washes hands after animal contactYes  Behavioral Response:engaged  Education:Hand Washing, Appropriate Animal Interaction   Education Outcome:Acknowledges education.   Clinical Observations/Feedback:Patient attended session and interacted appropriately with therapy dog and peers. Patient asked appropriate questions about therapy dog and his training.   Donovan Kail, Recreation Therapy Intern

## 2016-07-19 DIAGNOSIS — F39 Unspecified mood [affective] disorder: Secondary | ICD-10-CM

## 2016-07-19 DIAGNOSIS — G4700 Insomnia, unspecified: Secondary | ICD-10-CM

## 2016-07-19 LAB — LIPID PANEL
Cholesterol: 178 mg/dL (ref 0–200)
HDL: 52 mg/dL (ref >40)
LDL Cholesterol: 100 mg/dL — ABNORMAL HIGH (ref 0–99)
Total CHOL/HDL Ratio: 3.4 RATIO
Triglycerides: 132 mg/dL (ref <150)
VLDL: 26 mg/dL (ref 0–40)

## 2016-07-19 LAB — TSH: TSH: 2.64 u[IU]/mL (ref 0.350–4.500)

## 2016-07-19 MED ORDER — IBUPROFEN 400 MG PO TABS
400.0000 mg | ORAL_TABLET | Freq: Three times a day (TID) | ORAL | Status: DC | PRN
  Administered 2016-07-19 – 2016-07-25 (×9): 400 mg via ORAL
  Filled 2016-07-19 (×9): qty 1

## 2016-07-19 MED ORDER — LORATADINE 10 MG PO TABS
10.0000 mg | ORAL_TABLET | Freq: Every day | ORAL | Status: DC
  Administered 2016-07-19 – 2016-07-25 (×7): 10 mg via ORAL
  Filled 2016-07-19 (×10): qty 1

## 2016-07-19 NOTE — Progress Notes (Signed)
Recreation Therapy Notes  Date: 07/19/16 Time: 0930 Location: 300 Hall Dayroom  Group Topic: Stress Management  Goal Area(s) Addresses:  Patient will verbalize importance of using healthy stress management.  Patient will identify positive emotions associated with healthy stress management.   Intervention: Stress Management  Activity :  Latina Craver Relaxation.  LRT introduced the stress management technique of guided imagery.  LRT read a script to allow patients to follow along and participate in the activity.  Patients were to follow along as the script was read to engage in the activity.   Education:  Stress Management, Discharge Planning.   Education Outcome: Acknowledges edcuation/In group clarification offered/Needs additional education  Clinical Observations/Feedback: Pt did not attend group.   Victorino Sparrow, LRT/CTRS         Victorino Sparrow A 07/19/2016 12:39 PM

## 2016-07-19 NOTE — Progress Notes (Signed)
Nursing Progress Note: 7p-7a D: Pt currently presents with a sad/depressed affect and behavior. Pt states "I just feel like I was given the option of ending my life I don't know what I would choose. I don't know. Am I even alive right now? I don't feel alive. Like sometimes I feel so weighed down it hurts to breath. My thoughts race and I can't stop. People tell me to think of something else, and they just don't understand that I doesn't work that way." Interacting appropriately with milieu. Pt reports good sleep with current medication regimen.   A: Pt provided with medications per providers orders. Pt's labs and vitals were monitored throughout the night. Pt supported emotionally and encouraged to express concerns and questions. Pt educated on medications.  R: Pt's safety ensured with 15 minute and environmental checks. Pt currently endorses passive SI and denies HI and AVH. Pt verbally contracts to seek staff if SI/HI or A/VH occurs and to consult with staff before acting on any harmful thoughts. Will continue to monitor.

## 2016-07-19 NOTE — Progress Notes (Signed)
Adult Psychoeducational Group Note  Date:  07/19/2016 Time:  10:35 PM  Group Topic/Focus:  Wrap-Up Group:   The focus of this group is to help patients review their daily goal of treatment and discuss progress on daily workbooks.  Participation Level:  Active  Participation Quality:  Appropriate  Affect:  Appropriate  Cognitive:  Alert  Insight: Appropriate  Engagement in Group:  Engaged  Modes of Intervention:  Discussion  Additional Comments:  Patient rated her day an 8. Patient's goals for today was to (1) sleep, (2) to participate in all groups, (3) to not isolate herself.   [name] L [name] 07/19/2016, 10:35 PM

## 2016-07-19 NOTE — Plan of Care (Signed)
Problem: Activity: Goal: Sleeping patterns will improve Outcome: Adequate for Discharge Pt reports sleeping well.   Problem: Physical Regulation: Goal: Ability to maintain clinical measurements within normal limits will improve Pt's vitals have been WNL.

## 2016-07-19 NOTE — Progress Notes (Addendum)
[facility] MD Progress Note  07/19/2016 5:01 PM [name]  MRN:  [id number] Subjective:  Patient reports that she still feels depressed , anxious. Denies current suicidal ideations. Denies medication side effects . Objective : I have discussed case with treatment team and have met with patient. Today patient presents less depressed, states she is feeling " a little better" than prior to admission, but endorses ongoing sense of sadness, a vague sense of apprehension. Reports struggling with a sense of low self esteem.  Denies any active SI and contracts for safety on unit. Visible on unit, going to groups, no disruptive or agitated behaviors on unit. She is tolerating medications well, denies side effects .( She is on Lamictal, Latuda, Depakote ER- none of these medications is new for patient and states she has tolerated them well ).   Principal Problem: Bipolar Disorder Depressed  Diagnosis:   Patient Active Problem List   Diagnosis Date Noted  . Bipolar 1 disorder ([place]) [F31.9] 07/17/2016  . Low back pain [M54.5] 06/27/2016  . Left wrist injury, subsequent encounter [S69.92XD] 06/27/2016  . Pain of left thumb [M79.645] 10/07/2015  . 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 Strain of left thumb [IMO0002] 02/11/2015  . Strain of right forearm [S56.911A] 02/11/2015  . Right shoulder pain [M25.511] 12/31/2014  . Injury of right little finger [S69.91XA] 12/31/2014  . Episodic cluster headache, not intractable [G44.019] 12/02/2014  . 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 Hypersomnia, recurrent [G47.13] 12/02/2014  . 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 Lower back injury [S39.92XA] 09/30/2014  . Bipolar I disorder, most recent episode depressed ([place]) [F31.30]   . MDD (major depressive disorder), recurrent, severe, with psychosis ([place]) [F33.3]  09/23/2014  . Injury of left index finger [S69.92XA] 08/20/2014  . Right ankle sprain [S93.401A] 06/01/2014  . Contusion, multiple sites [T07.XXXA] 06/01/2014  . Strain of right gastrocnemius muscle [S86.111A] 06/01/2014  . Phonophobia [F40.298] 05/04/2014  . Photophobia of both eyes [H53.143] 05/04/2014  . 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 Nausea with vomiting [R11.2] 05/04/2014  . Mixed bipolar I disorder ([place]) [F31.60]   . Bipolar I disorder, most recent episode mixed ([place]) [F31.60] 04/18/2014  . Bipolar affective disorder, depressed, mild ([place]) [F31.31] 04/12/2014  . Suicidal ideation [R45.851] 04/12/2014  . Injury of right shoulder and upper arm [S49.91XA] 02/17/2014  . Left leg pain [M79.605] 06/03/2013  . Right hip pain [M25.551] 06/03/2013  . Migraine with status migrainosus [G43.901] 01/08/2013  . Personality disorder [F60.9]   . Chronic migraine [G43.709] 05/08/2012  . Contact dermatitis [L25.9] 11/27/2011  . Major depressive disorder, recurrent episode ([place]) [F33.9] 10/27/2011  . Generalized anxiety disorder [F41.1] 10/27/2011  . 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 Borderline personality disorder [F60.3] 10/27/2011  . Right foot pain [M79.671] 09/28/2011  . 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 Ganglion cyst [M67.40] 09/29/2009  . 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 BIPOLAR DISORDER UNSPECIFIED [F31.9] 06/09/2008   Total Time spent with patient: 20 minutes Past Medical History:  Past Medical History:  Diagnosis Date  . Achilles tendinitis   . Achilles tendinitis   . ADHD (attention deficit hyperactivity disorder)   . Allergy   . Arthritis   . Bipolar affective ([place])   . Bipolar disorder ([place])   . Cataracts, bilateral   .  Eczema   . Ganglion cyst 09/29/2009   left wrist (2 cyst)  . Hyperprolactinemia  ([place])   . Hypertension   . Lipoma   . Migraine   . Personality disorder   . Pes planus     Past Surgical History:  Procedure Laterality Date  . ANKLE SURGERY  12/88   left   . chest nodule  1990?   rt chest wall nodule removal  . GANGLION CYST EXCISION  2011  . lipoma removal    . right bunioectomy    . SHOULDER SURGERY  01/13/2011   right, partial tear  . tumor resection left thigh     Family History:  Family History  Problem Relation Age of Onset  . Hypertension Mother   . Hyperlipidemia Mother   . Heart attack Father   . Heart disease Father   . Hypertension Father   . Bipolar disorder Father   . Diabetes Paternal Grandfather   . Heart disease Maternal Aunt   . Breast cancer Maternal Aunt   . Heart disease Maternal Grandmother   . Cancer Maternal Grandmother        colon   Social History:  History  Alcohol Use No     History  Drug Use No    Social History   Social History  . Marital status: Single    Spouse name: N/A  . Number of children: 0  . Years of education: N/A   Occupational History  .  [employer]   Social History Main Topics  . Smoking status: Never Smoker  . Smokeless tobacco: Never Used  . Alcohol use No  . Drug use: No  . Sexual activity: No   Other Topics Concern  . None   Social History Narrative   Caffeine  2 sodas daily, 1 cup coffee daily.   Additional Social History:    Pain Medications: see MAR Prescriptions: see MAR Over the Counter: see MAR History of alcohol / drug use?: No history of alcohol / drug abuse  Sleep: Good  Appetite:  Good  Current Medications: Current Facility-Administered Medications  Medication Dose Route Frequency Provider Last Rate Last Dose  . divalproex (DEPAKOTE ER) 24 hr tablet 1,500 mg  1,500 mg Oral QHS Okonkwo, Justina A, NP   1,500 mg at 07/18/16 2233  . hydrOXYzine (ATARAX/VISTARIL) tablet 50 mg  50 mg Oral TID PRN Lu Duffel, Justina A, NP   50 mg at 07/18/16 1422  . ibuprofen (ADVIL,MOTRIN)  tablet 400 mg  400 mg Oral Q8H PRN Daney Moor A, MD      . lamoTRIgine (LAMICTAL) tablet 200 mg  200 mg Oral QHS Okonkwo, Justina A, NP   200 mg at 07/18/16 2233  . loratadine (CLARITIN) tablet 10 mg  10 mg Oral Daily Lejla Moeser A, MD      . lurasidone (LATUDA) tablet 80 mg  80 mg Oral Q supper Okonkwo, Justina A, NP   80 mg at 07/18/16 1819  . metoprolol succinate (TOPROL-XL) 24 hr tablet 100 mg  100 mg Oral Daily Okonkwo, Justina A, NP   100 mg at 07/19/16 0740  . SUMAtriptan (IMITREX) tablet 50 mg  50 mg Oral Q12H PRN Okonkwo, Justina A, NP   50 mg at 07/18/16 1932  . traZODone (DESYREL) tablet 50 mg  50 mg Oral QHS,MR X 1 Laverle Hobby, PA-C   50 mg at 07/18/16 2351    Lab Results:  Results for orders placed or performed during the hospital encounter  of 07/17/16 (from the past 48 hour(s))  Valproic acid level     Status: Abnormal   Collection Time: 07/18/16  6:32 AM  Result Value Ref Range   Valproic Acid Lvl 101 (H) 50.0 - 100.0 ug/mL    Comment: Performed at [facility], [place] [address] [place], [place] [address]  TSH     Status: None   Collection Time: 07/19/16  6:54 AM  Result Value Ref Range   TSH 2.640 0.350 - 4.500 uIU/mL    Comment: Performed by a 3rd Generation assay with a functional sensitivity of <=0.01 uIU/mL. Performed at [facility], [place] [address] [place], [place] [address]   Lipid panel     Status: Abnormal   Collection Time: 07/19/16  6:54 AM  Result Value Ref Range   Cholesterol 178 0 - 200 mg/dL   Triglycerides 132 <150 mg/dL   HDL 52 >40 mg/dL   Total CHOL/HDL Ratio 3.4 RATIO   VLDL 26 0 - 40 mg/dL   LDL Cholesterol 100 (H) 0 - 99 mg/dL    Comment:        Total Cholesterol/HDL:CHD Risk Coronary Heart Disease Risk Table                     Men   Women  1/2 Average Risk   3.4   3.3  Average Risk       5.0   4.4  2 X Average Risk   9.6   7.1  3 X Average Risk  23.4   11.0        Use the calculated  Patient Ratio above and the CHD Risk Table to determine the patient's CHD Risk.        ATP III CLASSIFICATION (LDL):  <100     mg/dL   Optimal  100-129  mg/dL   Near or Above                    Optimal  130-159  mg/dL   Borderline  160-189  mg/dL   High  >190     mg/dL   Very High Performed at [facility] [address] [place], [place] [address]     Blood Alcohol level:  Lab Results  Component Value Date   [facility] <5 07/15/2016   ETH <5 16/55/3748    Metabolic Disorder Labs: Lab Results  Component Value Date   HGBA1C 5.4 09/25/2014   MPG 108 09/25/2014   MPG 105 04/14/2014   Lab Results  Component Value Date   PROLACTIN  08/21/2009    14.3 (NOTE)     Reference Ranges:                 Female:                       2.1 -  17.1 ng/ml                 Female:   Pregnant          9.7 - 208.5 ng/mL                           Non Pregnant      2.8 -  29.2 ng/mL  Post  Menopausal   1.8 -  20.3 ng/mL                     PROLACTIN  06/05/2009    19.1 (NOTE)     Reference Ranges:                 Female:                       2.1 -  17.1 ng/ml                 Female:   Pregnant          9.7 - 208.5 ng/mL                           Non Pregnant      2.8 -  29.2 ng/mL                           Post  Menopausal   1.8 -  20.3 ng/mL                     Lab Results  Component Value Date   CHOL 178 07/19/2016   TRIG 132 07/19/2016   HDL 52 07/19/2016   CHOLHDL 3.4 07/19/2016   VLDL 26 07/19/2016   LDLCALC 100 (H) 07/19/2016   LDLCALC 96 09/25/2014    Physical Findings: AIMS:  , ,  ,  ,    CIWA:    COWS:     Musculoskeletal: Strength & Muscle Tone: within normal limits Gait & Station: normal Patient leans: N/A  Psychiatric Specialty Exam: Physical Exam  ROS no headache, no chest pain, describes history of seasonal allergies, no vomiting   Blood pressure 113/75, pulse 81, temperature 98.7 F (37.1 C), temperature source Oral, resp. rate 18, height  5' 9"  (1.753 m), weight 121.1 kg (267 lb), last menstrual period 07/08/2016, SpO2 100 %.Body mass index is 39.43 kg/m.  General Appearance: Fairly Groomed  Eye Contact:  Good  Speech:  Normal Rate  Volume:  Normal  Mood:  depressed but improving   Affect:  constricted, but more reactive, improves during session, smiles at times appropriately   Thought Process:  Linear and Descriptions of Associations: Intact  Orientation:  Full (Time, Place, and Person)  Thought Content:  no hallucinations, no delusions, not internally preoccupied   Suicidal Thoughts:  No denies suicidal or self injurious plan or intention at this time, contracts for safety on unit   Homicidal Thoughts:  No denies violent or homicidal ideations  Memory:  recent and remote grossly intact   Judgement:  Fair- improving   Insight:  Fair- improving   Psychomotor Activity:  Normal  Concentration:  Concentration: Good and Attention Span: Good  Recall:  Good  Fund of Knowledge:  Good  Language:  Negative  Akathisia:  Negative  Handed:  Right  AIMS (if indicated):     Assets:  Communication Skills Desire for Improvement Resilience  ADL's:  Intact  Cognition:  WNL  Sleep:  Number of Hours: 6.75   Assessment - patient reports ongoing depression, anxiety, denies active SI at this time. Mood seems partially improved compared to admission . Thus far tolerating medications well . Denies medication side effects. Of note,has been on combination Depakote ER and Lamictal prior to admission, denies interactions, denies any rash. She  requests medication for seasonal allergies, such as Claritin.  Treatment Plan Summary: Daily contact with patient to assess and evaluate symptoms and progress in treatment, Medication management, Plan inpatient treatment  and medications as below Encourage group and milieu participation to work on coping skills and symptom reduction Continue Latuda 80 mgrs QDAY for mood disorder  Continue Depakote ER  1500 mgrs QHS for mood disorder  Continue Lamictal 200 mgrs QDAY for mood disorder Start Claritin 10 mgrs QDAY for history of allergies   Continue Trazodone 50 mgrs QHS PRN for insomnia Treatment team working on disposition planning options Jenne Campus, MD 07/19/2016, 5:01 PM

## 2016-07-19 NOTE — Progress Notes (Signed)
D: [name] admitted SI but contracted for safety. She denied HI and AVH. She's been calm and appropriate today, with no notable emotional lability or concerns voiced. On her self inventory, she rated her depression 7/10, hopelessness 5/10, and anxiety 8/10. She reported good sleep, fair appetite, low energy level, and poor concentration. She's said she hopes to work on not isolating in her room and on interacting in group.   A: Meds given as ordered. Q15 safety checks maintained. Discussed pt with treatment team. Support/encouragement offered.  R: Pt remains free from harm and continues with treatment. Will continue to monitor for needs/safety.

## 2016-07-19 NOTE — [facility] Group Notes (Signed)
[facility] Group Therapy 07/19/2016 1:15pm  Type of Therapy: Mental Health Association Presentation  Participation Level: Active  Participation Quality: Attentive  Affect: Appropriate  Cognitive: Oriented  Insight: Developing/Improving  Engagement in Therapy: Engaged  Modes of Intervention: Discussion, Education and Socialization  Summary of Progress/Problems: [facility] [employer] ([place]) Speaker came to talk about his personal journey with substance abuse and addiction. The pt processed ways by which to relate to the speaker. [place] speaker provided handouts and educational information pertaining to groups and services offered by the [facility]. Pt was engaged in speaker's presentation and was receptive to resources provided.    Kara Mead. Marshell Levan, MSW, [facility] 07/19/2016 2:37 PM

## 2016-07-19 NOTE — Tx Team (Signed)
Interdisciplinary Treatment and Diagnostic Plan Update 07/19/2016 Time of Session: 9:30am  [name]  MRN: [id number]  Principal Diagnosis: Bipolar Disorder, Depressed   Secondary Diagnoses: Active Problems:   Bipolar 1 disorder (HCC)   Current Medications:  Current Facility-Administered Medications  Medication Dose Route Frequency Provider Last Rate Last Dose  . divalproex (DEPAKOTE ER) 24 hr tablet 1,500 mg  1,500 mg Oral QHS Okonkwo, Justina A, NP   1,500 mg at 07/18/16 2233  . hydrOXYzine (ATARAX/VISTARIL) tablet 50 mg  50 mg Oral TID PRN Lu Duffel, Justina A, NP   50 mg at 07/18/16 1422  . lamoTRIgine (LAMICTAL) tablet 200 mg  200 mg Oral QHS Okonkwo, Justina A, NP   200 mg at 07/18/16 2233  . lurasidone (LATUDA) tablet 80 mg  80 mg Oral Q supper Okonkwo, Justina A, NP   80 mg at 07/18/16 1819  . metoprolol succinate (TOPROL-XL) 24 hr tablet 100 mg  100 mg Oral Daily Okonkwo, Justina A, NP   100 mg at 07/19/16 0740  . SUMAtriptan (IMITREX) tablet 50 mg  50 mg Oral Q12H PRN Okonkwo, Justina A, NP   50 mg at 07/18/16 1932  . traZODone (DESYREL) tablet 50 mg  50 mg Oral QHS,MR X 1 Laverle Hobby, PA-C   50 mg at 07/18/16 2351    PTA Medications: Prescriptions Prior to Admission  Medication Sig Dispense Refill Last Dose  . azelastine (ASTELIN) 0.1 % nasal spray instill 1 to 2 sprays into each nostril twice a day  0 Taking  . divalproex (DEPAKOTE ER) 500 MG 24 hr tablet take 3 tablets by mouth at bedtime 90 tablet 1 07/14/2016 at Unknown time  . hydrocortisone cream 0.5 % Apply 1 application topically daily as needed for itching.   Taking  . hydrOXYzine (VISTARIL) 50 MG capsule take 1 capsule by mouth twice a day and 1 capsule at bedtime  0 07/14/2016 at Unknown time  . lamoTRIgine (LAMICTAL) 200 MG tablet Take 1 tablet (200 mg total) by mouth at bedtime. For mood stabilization (Patient taking differently: Take 300 mg by mouth at bedtime. For mood stabilization) 30 tablet 0  07/14/2016 at Unknown time  . Lurasidone HCl 120 MG TABS Take 1 tablet by mouth every evening.   07/14/2016 at Unknown time  . meloxicam (MOBIC) 15 MG tablet Take 1 tablet (15 mg total) by mouth daily. (Patient not taking: Reported on 07/15/2016) 30 tablet 2 Completed Course at Unknown time  . metaxalone (SKELAXIN) 800 MG tablet Take 1 tablet (800 mg total) by mouth 3 (three) times daily as needed for muscle spasms. 60 tablet 1 Taking  . metoprolol succinate (TOPROL-XL) 100 MG 24 hr tablet Take 1 tablet (100 mg total) by mouth daily. Take with or immediately following a meal for blood pressure control. 30 tablet 0 07/15/2016 at Unknown time  . Multiple Vitamin (MULTIVITAMIN WITH MINERALS) TABS tablet Take 1 tablet by mouth daily. For nutritional supplementation. 30 tablet 0 07/15/2016 at Unknown time  . ondansetron (ZOFRAN) 4 MG tablet Take 1 tablet (4 mg total) by mouth every 8 (eight) hours as needed for nausea or vomiting. 20 tablet 0 07/14/2016 at Unknown time  . Polyvinyl Alcohol-Povidone (REFRESH OP) Apply 1 tablet to eye daily as needed. For dry eyes   Taking  . promethazine (PHENERGAN) 25 MG tablet Take 1 tablet (25 mg total) by mouth every 6 (six) hours as needed for nausea or vomiting (or headache). 12 tablet 0 Taking  . rOPINIRole (REQUIP)  2 MG tablet    07/14/2016 at Unknown time  . sodium chloride 0.9 % SOLN 100 mL with valproate 500 MG/5ML SOLN 500 mg Inject 500 mg into the vein once as needed. To be infused over  a 15 minutes period, repeat once if partial relief.   Taking  . sodium chloride 0.9 % SOLN 100 mL with valproate 500 MG/5ML SOLN 500 mg Inject 500 mg into the vein once as needed. To be infused over  a 15 minutes period, repeat once if partial relief. 1000 mg 1   . SUMAtriptan 6 MG/0.5ML SOAJ Inject 6 mg into the skin as needed. 4 Cartridge 5 07/14/2016 at Unknown time  . zolpidem (AMBIEN) 10 MG tablet Take 10 mg by mouth at bedtime.   07/14/2016 at Unknown time    Treatment  Modalities: Medication Management, Group therapy, Case management,  1 to 1 session with clinician, Psychoeducation, Recreational therapy.  Patient Stressors: Health problems Medication change or noncompliance Patient Strengths: Motivation for treatment/growth Religious Affiliation Supportive family/friends  Physician Treatment Plan for Primary Diagnosis: Bipolar Disorder, Depressed  Long Term Goal(s): Improvement in symptoms so as ready for discharge Short Term Goals: Ability to verbalize feelings will improve Ability to disclose and discuss suicidal ideas Ability to demonstrate self-control will improve Ability to identify and develop effective coping behaviors will improve Ability to maintain clinical measurements within normal limits will improve Ability to verbalize feelings will improve Ability to disclose and discuss suicidal ideas Ability to demonstrate self-control will improve Ability to identify and develop effective coping behaviors will improve Ability to maintain clinical measurements within normal limits will improve Compliance with prescribed medications will improve  Medication Management: Evaluate patient's response, side effects, and tolerance of medication regimen.  Therapeutic Interventions: 1 to 1 sessions, Unit Group sessions and Medication administration.  Evaluation of Outcomes: Progressing  Physician Treatment Plan for Secondary Diagnosis: Active Problems:   Bipolar 1 disorder ([place])  Long Term Goal(s): Improvement in symptoms so as ready for discharge  Short Term Goals: Ability to verbalize feelings will improve Ability to disclose and discuss suicidal ideas Ability to demonstrate self-control will improve Ability to identify and develop effective coping behaviors will improve Ability to maintain clinical measurements within normal limits will improve Ability to verbalize feelings will improve Ability to disclose and discuss suicidal ideas Ability to  demonstrate self-control will improve Ability to identify and develop effective coping behaviors will improve Ability to maintain clinical measurements within normal limits will improve Compliance with prescribed medications will improve  Medication Management: Evaluate patient's response, side effects, and tolerance of medication regimen.  Therapeutic Interventions: 1 to 1 sessions, Unit Group sessions and Medication administration.  Evaluation of Outcomes: Progressing  RN Treatment Plan for Primary Diagnosis: Bipolar Disorder, Depressed  Long Term Goal(s): Knowledge of disease and therapeutic regimen to maintain health will improve  Short Term Goals: Compliance with prescribed medications will improve  Medication Management: RN will administer medications as ordered by provider, will assess and evaluate patient's response and provide education to patient for prescribed medication. RN will report any adverse and/or side effects to prescribing provider.  Therapeutic Interventions: 1 on 1 counseling sessions, Psychoeducation, Medication administration, Evaluate responses to treatment, Monitor vital signs and CBGs as ordered, Perform/monitor CIWA, COWS, AIMS and Fall Risk screenings as ordered, Perform wound care treatments as ordered.  Evaluation of Outcomes: Progressing  LCSW Treatment Plan for Primary Diagnosis: Bipolar Disorder, Depressed  Long Term Goal(s): Safe transition to appropriate next level of care at  discharge, Engage patient in therapeutic group addressing interpersonal concerns. Short Term Goals: Engage patient in aftercare planning with referrals and resources, Increase ability to appropriately verbalize feelings, Identify triggers associated with mental health/substance abuse issues and Increase skills for wellness and recovery  Therapeutic Interventions: Assess for all discharge needs, 1 to 1 time with Social worker, Explore available resources and support systems, Assess  for adequacy in community support network, Educate family and significant other(s) on suicide prevention, Complete Psychosocial Assessment, Interpersonal group therapy.  Evaluation of Outcomes: Progressing  Progress in Treatment: Attending groups: Yes Participating in groups: Yes Taking medication as prescribed: Yes, MD continues to assess for medication changes as needed Toleration medication: Yes, no side effects reported at this time Family/Significant other contact made: No, CSW will make contact with family if pt consents. Patient understands diagnosis: Continuing to assess Discussing patient identified problems/goals with staff: Yes Medical problems stabilized or resolved: Yes Denies suicidal/homicidal ideation: No, pt recently admitted for SI Issues/concerns per patient self-inventory: None Other: N/A  New problem(s) identified: None identified at this time.   New Short Term/Long Term Goal(s): None identified at this time.   Discharge Plan or Barriers: Pt will return home and follow up with an outpatient provider.  Reason for Continuation of Hospitalization:  Anxiety  Depression Medication stabilization Suicidal ideation  Estimated Length of Stay: 3-5 days; Estimated discharge date 07/24/16  Attendees: Patient: 07/19/2016 2:22 PM  Physician: Dr. Parke Poisson 07/19/2016 2:22 PM  Nursing: Santiago Glad, RN 07/19/2016 2:22 PM  RN Care Manager: Lars Pinks, RN 07/19/2016 2:22 PM  Social Worker: Matthew Saras, Anza 07/19/2016 2:22 PM  Recreational Therapist:  07/19/2016 2:22 PM  Other: Lindell Spar, NP 07/19/2016 2:22 PM  Other:  07/19/2016 2:22 PM  Other: 07/19/2016 2:22 PM   Scribe for Treatment Team: Georga Kaufmann, MSW,LCSWA 07/19/2016 2:22 PM

## 2016-07-20 LAB — HEMOGLOBIN A1C
Hgb A1c MFr Bld: 5.1 % (ref 4.8–5.6)
Mean Plasma Glucose: 100 mg/dL

## 2016-07-20 LAB — PROLACTIN: Prolactin: 52.8 ng/mL — ABNORMAL HIGH (ref 4.8–23.3)

## 2016-07-20 MED ORDER — SERTRALINE HCL 25 MG PO TABS
25.0000 mg | ORAL_TABLET | Freq: Every day | ORAL | Status: DC
  Administered 2016-07-20 – 2016-07-21 (×2): 25 mg via ORAL
  Filled 2016-07-20 (×4): qty 1

## 2016-07-20 MED ORDER — ONDANSETRON 4 MG PO TBDP
8.0000 mg | ORAL_TABLET | Freq: Three times a day (TID) | ORAL | Status: DC | PRN
  Administered 2016-07-20 – 2016-07-24 (×2): 8 mg via ORAL
  Filled 2016-07-20 (×2): qty 2

## 2016-07-20 NOTE — Progress Notes (Signed)
  DATA ACTION RESPONSE  Objective- Pt. is visible in the dayroom, seen putting together a puzzle. Presents with an anxious/labile affect and mood. Forwards little on approach. C/o of n/v this evening. Provider notified. Orders obtained. See MAR.  Subjective- Denies having any SI/HI/AVH/Pain at this time. Is cooperative and remain safe on the unit.  1:1 interaction in private to establish rapport. Encouragement, education, & support given from staff.  PRN Zofran and Vistaril requested and will re-eval accordingly.   Safety maintained with Q 15 checks. Continue with POC.

## 2016-07-20 NOTE — [facility] Group Notes (Signed)
[facility] LCSW Group Therapy 07/20/2016 1:15 PM  Type of Therapy: Group Therapy- Emotion Regulation  Participation Level: Active   Participation Quality:  Appropriate  Affect: Appropriate  Cognitive: Alert and Oriented   Insight:  Developing/Improving  Engagement in Therapy: Developing/Improving and Engaged   Modes of Intervention: Clarification, Confrontation, Discussion, Education, Exploration, Limit-setting, Orientation, Problem-solving, Rapport Building, Art therapist, Socialization and Support  Summary of Progress/Problems: The topic for group today was emotional regulation. This group focused on both positive and negative emotion identification and allowed group members to process ways to identify feelings, regulate negative emotions, and find healthy ways to manage internal/external emotions. Group members were asked to reflect on a time when their reaction to an emotion led to a negative outcome and explored how alternative responses using emotion regulation would have benefited them. Group members were also asked to discuss a time when emotion regulation was utilized when a negative emotion was experienced. Pt states that depression is an emotion that she has a difficult time regulating. Pt reports that working helps because it forces her to get out of the house. She states that she likes to focus on tasks that she can see tangible progress while she completing them.   Matthew Saras, MSW, LCSWA 07/20/2016 4:08 PM

## 2016-07-20 NOTE — Plan of Care (Signed)
Problem: Medication: Goal: Compliance with prescribed medication regimen will improve Outcome: Progressing Pt. take meds. as prescribed.

## 2016-07-20 NOTE — Progress Notes (Signed)
D:Pt rates depression as an 8 and anxiety as a 7 on 0-10 scale with 10 being the most. Pt reports passive si thoughts and c/o lt wrist pain. Pt talked about working at [employer] for the past two years and says that recent suicide on social media has contributed to her increase in suicide thoughts. A:Offered encouragement and 15 minute checks. Supported pt to discuss feelings. Gave medication as ordered including prn medication for lt wrist pain. R:Pt contracts with staff for safety. Safety maintained on the unit.

## 2016-07-20 NOTE — [facility] Group Notes (Signed)
[facility] Group Notes:  (Nursing/MHT/Case Management/Adjunct)  Date:  07/20/2016  Time:  0850 am  Type of Therapy:  Nurse Education  Participation Level:  Did Not Attend  Patient invited; declined to attend.  [name] 07/20/2016, 3:59 PM

## 2016-07-20 NOTE — Progress Notes (Signed)
[facility] MD Progress Note  07/20/2016 11:19 AM [name]  MRN:  [id number] Subjective: She continues to report depression and some passive SI, although is able to contract for safety on unit. She continues to ruminate about recent high profile suicides . Denies medication side effects. Objective : I have discussed case with treatment team and have met with patient. Patient presents partially improved compared to admission, smiling at times appropriately, but still reporting depression. Denies psychotic symptoms. As above, reports residual passive SI, but no current plan or intent. Denies medication side effects. Has had some nausea, no vomiting. She has been on the current medication regimen for a period of time and has tolerated it well . No disruptive or agitated behaviors on unit, going to some groups, visible in milieu, pleasant on approach.   Principal Problem: Bipolar Disorder Depressed  Diagnosis:   Patient Active Problem List   Diagnosis Date Noted  . Bipolar 1 disorder ([place]) [F31.9] 07/17/2016  . Low back pain [M54.5] 06/27/2016  . Left wrist injury, subsequent encounter [S69.92XD] 06/27/2016  . Pain of left thumb [M79.645] 10/07/2015  . 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 Strain of left thumb [IMO0002] 02/11/2015  . Strain of right forearm [S56.911A] 02/11/2015  . Right shoulder pain [M25.511] 12/31/2014  . Injury of right little finger [S69.91XA] 12/31/2014  . Episodic cluster headache, not intractable [G44.019] 12/02/2014  . 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 Hypersomnia, recurrent [G47.13] 12/02/2014  . 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 Lower back injury [S39.92XA] 09/30/2014  . Bipolar I disorder, most recent episode depressed ([place]) [F31.30]   . MDD (major depressive disorder), recurrent, severe, with psychosis  ([place]) [F33.3] 09/23/2014  . Injury of left index finger [S69.92XA] 08/20/2014  . Right ankle sprain [S93.401A] 06/01/2014  . Contusion, multiple sites [T07.XXXA] 06/01/2014  . Strain of right gastrocnemius muscle [S86.111A] 06/01/2014  . Phonophobia [F40.298] 05/04/2014  . Photophobia of both eyes [H53.143] 05/04/2014  . 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 Nausea with vomiting [R11.2] 05/04/2014  . Mixed bipolar I disorder ([place]) [F31.60]   . Bipolar I disorder, most recent episode mixed ([place]) [F31.60] 04/18/2014  . Bipolar affective disorder, depressed, mild ([place]) [F31.31] 04/12/2014  . Suicidal ideation [R45.851] 04/12/2014  . Injury of right shoulder and upper arm [S49.91XA] 02/17/2014  . Left leg pain [M79.605] 06/03/2013  . Right hip pain [M25.551] 06/03/2013  . Migraine with status migrainosus [G43.901] 01/08/2013  . Personality disorder [F60.9]   . Chronic migraine [G43.709] 05/08/2012  . Contact dermatitis [L25.9] 11/27/2011  . Major depressive disorder, recurrent episode ([place]) [F33.9] 10/27/2011  . Generalized anxiety disorder [F41.1] 10/27/2011  . 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 Borderline personality disorder [F60.3] 10/27/2011  . Right foot pain [M79.671] 09/28/2011  . 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 Ganglion cyst [M67.40] 09/29/2009  . 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 BIPOLAR DISORDER UNSPECIFIED [F31.9] 06/09/2008   Total Time spent with patient: 20 minutes Past Medical History:  Past Medical History:  Diagnosis Date  . Achilles tendinitis   . Achilles tendinitis   . ADHD (attention deficit hyperactivity disorder)   . Allergy   . Arthritis   . Bipolar affective ([place])   . Bipolar disorder ([place])   . Cataracts, bilateral   . Eczema   .  Ganglion cyst 09/29/2009   left wrist (2 cyst)  .  Hyperprolactinemia ([place])   . Hypertension   . Lipoma   . Migraine   . Personality disorder   . Pes planus     Past Surgical History:  Procedure Laterality Date  . ANKLE SURGERY  12/88   left   . chest nodule  1990?   rt chest wall nodule removal  . GANGLION CYST EXCISION  2011  . lipoma removal    . right bunioectomy    . SHOULDER SURGERY  01/13/2011   right, partial tear  . tumor resection left thigh     Family History:  Family History  Problem Relation Age of Onset  . Hypertension Mother   . Hyperlipidemia Mother   . Heart attack Father   . Heart disease Father   . Hypertension Father   . Bipolar disorder Father   . Diabetes Paternal Grandfather   . Heart disease Maternal Aunt   . Breast cancer Maternal Aunt   . Heart disease Maternal Grandmother   . Cancer Maternal Grandmother        colon   Social History:  History  Alcohol Use No     History  Drug Use No    Social History   Social History  . Marital status: Single    Spouse name: N/A  . Number of children: 0  . Years of education: N/A   Occupational History  .  [employer]   Social History Main Topics  . Smoking status: Never Smoker  . Smokeless tobacco: Never Used  . Alcohol use No  . Drug use: No  . Sexual activity: No   Other Topics Concern  . None   Social History Narrative   Caffeine  2 sodas daily, 1 cup coffee daily.   Additional Social History:    Pain Medications: see MAR Prescriptions: see MAR Over the Counter: see MAR History of alcohol / drug use?: No history of alcohol / drug abuse  Sleep: Good  Appetite:  Fair  Current Medications: Current Facility-Administered Medications  Medication Dose Route Frequency Provider Last Rate Last Dose  . divalproex (DEPAKOTE ER) 24 hr tablet 1,500 mg  1,500 mg Oral QHS Okonkwo, Justina A, NP   1,500 mg at 07/19/16 2220  . hydrOXYzine (ATARAX/VISTARIL) tablet 50 mg  50 mg Oral TID PRN Lu Duffel, Justina A, NP   50 mg at 07/18/16 1422  .  ibuprofen (ADVIL,MOTRIN) tablet 400 mg  400 mg Oral Q8H PRN Cobos, Myer Peer, MD   400 mg at 07/20/16 0842  . lamoTRIgine (LAMICTAL) tablet 200 mg  200 mg Oral QHS Okonkwo, Justina A, NP   200 mg at 07/19/16 2220  . loratadine (CLARITIN) tablet 10 mg  10 mg Oral Daily Cobos, Myer Peer, MD   10 mg at 07/20/16 0827  . lurasidone (LATUDA) tablet 80 mg  80 mg Oral Q supper Okonkwo, Justina A, NP   80 mg at 07/19/16 1807  . metoprolol succinate (TOPROL-XL) 24 hr tablet 100 mg  100 mg Oral Daily Okonkwo, Justina A, NP   100 mg at 07/20/16 0840  . SUMAtriptan (IMITREX) tablet 50 mg  50 mg Oral Q12H PRN Okonkwo, Justina A, NP   50 mg at 07/18/16 1932  . traZODone (DESYREL) tablet 50 mg  50 mg Oral QHS,MR X 1 Laverle Hobby, PA-C   50 mg at 07/19/16 2221    Lab Results:  Results for orders placed or performed during the  hospital encounter of 07/17/16 (from the past 48 hour(s))  TSH     Status: None   Collection Time: 07/19/16  6:54 AM  Result Value Ref Range   TSH 2.640 0.350 - 4.500 uIU/mL    Comment: Performed by a 3rd Generation assay with a functional sensitivity of <=0.01 uIU/mL. Performed at [facility], [place] [address] [place], [place] [address]   Lipid panel     Status: Abnormal   Collection Time: 07/19/16  6:54 AM  Result Value Ref Range   Cholesterol 178 0 - 200 mg/dL   Triglycerides 132 <150 mg/dL   HDL 52 >40 mg/dL   Total CHOL/HDL Ratio 3.4 RATIO   VLDL 26 0 - 40 mg/dL   LDL Cholesterol 100 (H) 0 - 99 mg/dL    Comment:        Total Cholesterol/HDL:CHD Risk Coronary Heart Disease Risk Table                     Men   Women  1/2 Average Risk   3.4   3.3  Average Risk       5.0   4.4  2 X Average Risk   9.6   7.1  3 X Average Risk  23.4   11.0        Use the calculated Patient Ratio above and the CHD Risk Table to determine the patient's CHD Risk.        ATP III CLASSIFICATION (LDL):  <100     mg/dL   Optimal  100-129  mg/dL   Near or Above                     Optimal  130-159  mg/dL   Borderline  160-189  mg/dL   High  >190     mg/dL   Very High Performed at [place] [address], [place], [place] [address]   Prolactin     Status: Abnormal   Collection Time: 07/19/16  6:54 AM  Result Value Ref Range   Prolactin 52.8 (H) 4.8 - 23.3 ng/mL    Comment: (NOTE) Performed At: [facility] [place], [place] [id number] Lindon Romp MD UU:[id number] Performed at [facility], [place] [address], [place], [place] [address]   Hemoglobin A1c     Status: None   Collection Time: 07/19/16  6:54 AM  Result Value Ref Range   Hgb A1c MFr Bld 5.1 4.8 - 5.6 %    Comment: (NOTE)         Pre-diabetes: 5.7 - 6.4         Diabetes: >6.4         Glycemic control for adults with diabetes: <7.0    Mean Plasma Glucose 100 mg/dL    Comment: (NOTE) Performed At: [facility] [place], [place] [id number] Lindon Romp MD [id number] Performed at [facility], [place] [address], [place], [place] [address]     Blood Alcohol level:  Lab Results  Component Value Date   [facility] <5 07/15/2016   ETH <5 20/10/710    Metabolic Disorder Labs: Lab Results  Component Value Date   HGBA1C 5.1 07/19/2016   MPG 100 07/19/2016   MPG 108 09/25/2014   Lab Results  Component Value Date   PROLACTIN 52.8 (H) 07/19/2016   PROLACTIN  08/21/2009    14.3 (NOTE)     Reference Ranges:  Female:                       2.1 -  17.1 ng/ml                 Female:   Pregnant          9.7 - 208.5 ng/mL                           Non Pregnant      2.8 -  29.2 ng/mL                           Post  Menopausal   1.8 -  20.3 ng/mL                     Lab Results  Component Value Date   CHOL 178 07/19/2016   TRIG 132 07/19/2016   HDL 52 07/19/2016   CHOLHDL 3.4 07/19/2016   VLDL 26 07/19/2016   LDLCALC 100 (H) 07/19/2016   LDLCALC 96 09/25/2014    Physical  Findings: AIMS:  , ,  ,  ,    CIWA:    COWS:     Musculoskeletal: Strength & Muscle Tone: within normal limits Gait & Station: normal Patient leans: N/A  Psychiatric Specialty Exam: Physical Exam  ROS reports nausea, no vomiting, no fever, no rash  Blood pressure 102/73, pulse 77, temperature 98.3 F (36.8 C), resp. rate 18, height 5' 9"  (1.753 m), weight 121.1 kg (267 lb), last menstrual period 07/08/2016, SpO2 100 %.Body mass index is 39.43 kg/m.  General Appearance: Well Groomed  Eye Contact:  Good  Speech:  Normal Rate  Volume:  Normal  Mood:  reports ongoing depression  Affect:  less constricted, smiles briefly at times   Thought Process:  Linear and Descriptions of Associations: Intact  Orientation:  Full (Time, Place, and Person)  Thought Content:  no psychotic symptoms, ruminative   Suicidal Thoughts:  Yes.  without intent/plan denies suicidal or self injurious plan or intention at this time, contracts for safety on unit   Homicidal Thoughts:  No denies violent or homicidal ideations  Memory:  recent and remote grossly intact   Judgement:  Other:  improving  Insight:  Fair and improving   Psychomotor Activity:  Normal  Concentration:  Concentration: Good and Attention Span: Good  Recall:  Good  Fund of Knowledge:  Good  Language:  Negative  Akathisia:  Negative  Handed:  Right  AIMS (if indicated):     Assets:  Communication Skills Desire for Improvement Resilience  ADL's:  Intact  Cognition:  WNL  Sleep:  Number of Hours: 6.75   Assessment - patient is presenting with partially improved range of affect. She endorses ongoing depression, and passive SI, but no plan or intention, and is future oriented, for example stating she plans to return to work at [employer] after her discharge. Tolerating medications well, but describes mild nausea.  We discussed medication options- she is agreeing to add an antidepressant medication to her current regimen in an effort to improve  mood. Agrees to Zoloft- will start at low dose to minimize side effect risk. Treatment Plan Summary: Treatment plan reviewed as below today 6/21 Daily contact with patient to assess and evaluate symptoms and progress in treatment, Medication management, Plan inpatient treatment  and medications as below Encourage group and milieu  participation to work on Radiographer, therapeutic and symptom reduction Continue Latuda 80 mgrs QDAY for mood disorder  Continue Depakote ER 1500 mgrs QHS for mood disorder  Continue Lamictal 200 mgrs QDAY for mood disorder Continue Claritin 10 mgrs QDAY for history of allergies   Start Zoloft at 25 mgrs QDAY for depression Continue Trazodone 50 mgrs QHS PRN for insomnia Treatment team working on disposition planning options Jenne Campus, MD 07/20/2016, 11:19 AM   Patient ID: [name], female   DOB: 1970-03-18, 46 y.o.   MRN: [id number]

## 2016-07-20 NOTE — Plan of Care (Signed)
Problem: Medication: Goal: Compliance with prescribed medication regimen will improve Outcome: Progressing Pt is taking medications as prescribed.   

## 2016-07-20 NOTE — Progress Notes (Signed)
D) Pt. Discussed not wanting to rush her stay at [facility] this time.  Pt. States she feels connected with her out patient therapist and that she knows the importance of honesty in therapy relationships. A)  Discussed medications and reviewed use of bed time Trazodone.  Discussed coping skills and given positive feedback for using puzzles to self soothe. R) Pt. Receptive and remains safe at this time.

## 2016-07-20 NOTE — Progress Notes (Signed)
Patient did not attend karaoke group tonight. 

## 2016-07-21 DIAGNOSIS — R45851 Suicidal ideations: Secondary | ICD-10-CM

## 2016-07-21 MED ORDER — TRAZODONE HCL 50 MG PO TABS
50.0000 mg | ORAL_TABLET | Freq: Every evening | ORAL | Status: DC | PRN
  Administered 2016-07-21 – 2016-07-22 (×2): 50 mg via ORAL
  Filled 2016-07-21: qty 1

## 2016-07-21 MED ORDER — SERTRALINE HCL 50 MG PO TABS
50.0000 mg | ORAL_TABLET | Freq: Every day | ORAL | Status: DC
  Administered 2016-07-22 – 2016-07-25 (×4): 50 mg via ORAL
  Filled 2016-07-21 (×6): qty 1

## 2016-07-21 NOTE — Plan of Care (Signed)
Problem: Coping: Goal: Ability to cope will improve Outcome: Progressing Nurse discussed anxiety/depression/coping skills with patient.    

## 2016-07-21 NOTE — Progress Notes (Signed)
Recreation Therapy Notes  Date: 07/21/16 Time: 0930 Location: 300 Hall Dayroom  Group Topic: Stress Management  Goal Area(s) Addresses:  Patient will verbalize importance of using healthy stress management.  Patient will identify positive emotions associated with healthy stress management.   Intervention: Stress Management  Activity : Progressive Muscle Relaxation.  LRT introduced the stress management technique of progressive muscle relaxation.  LRT read a script to guide patients through the process of pmr so they could get the full scope of the technique.  Patients were to follow along as the script was read to fully engage in technique.   Education: Stress Management, Discharge Planning.   Education Outcome: Acknowledges edcuation/In group clarification offered/Needs additional education  Clinical Observations/Feedback: Pt did not attend group.   Victorino Sparrow, LRT/CTRS        Victorino Sparrow A 07/21/2016 11:26 AM

## 2016-07-21 NOTE — Progress Notes (Signed)
D:  Patient's self inventory sheet, patient sleeps good, sleep medication helpful.  Fair appetite, low energy level, good concentration.  Rated depression 5, hopeless 4, anxiety 4.  Denied withdrawals.  SI off/on, contracts for safety, no plan.  Physical problems, dizziness, pain.  Left hand/wrist pain, pain medication helpful.  Goal is continue to participate in groups/ be positive.  Plans to nudge/push herself to meeting herself in the middle.  No discharge plans. A:  Medications administered per MD orders.  Emotional support and encouragement given patient. R:  Patient denied HI.  Denied A/V hallucinations.  Safety maintained with 15 minute checks.  SI off/on, no plan, contracts for safety.

## 2016-07-21 NOTE — [facility] Group Notes (Signed)
[place] LCSW Group Therapy 07/21/2016 1:15pm  Type of Therapy: Group Therapy- Feelings Around Relapse and Recovery  Participation Level: Active   Participation Quality:  Appropriate  Affect:  Appropriate  Cognitive: Alert and Oriented   Insight:  Developing   Engagement in Therapy: Developing/Improving and Engaged   Modes of Intervention: Clarification, Confrontation, Discussion, Education, Exploration, Limit-setting, Orientation, Problem-solving, Rapport Building, Art therapist, Socialization and Support  Summary of Progress/Problems: The topic for today was feelings about relapse. The group discussed what relapse prevention is to them and identified triggers that they are on the path to relapse. Members also processed their feeling towards relapse and were able to relate to common experiences. Group also discussed coping skills that can be used for relapse prevention. Pt reports that she is ready to make some changes when she comes home to prevent relapse. Pt states that she was able to stay out of the hospital for 2 years and she is proud of that. CSW asked pt how she was able to maintain recovery for so long and pt states that she kept herself busy and her mind distracted.    Therapeutic Modalities:   Cognitive Behavioral Therapy Solution-Focused Therapy Assertiveness Training Relapse Prevention Therapy    Matthew Saras, MSW, [place] 07/21/2016 3:17 PM

## 2016-07-21 NOTE — Progress Notes (Signed)
Patient has been in dayroom today, working puzzles, talking to peers.  Patient stated this morning that "I'm so slow I might not make it on the outside.  Want to discharge well and not have to come right back to hospital.   Work supervisor knows that I am here and they understand.  Felt like meltdown in store before I came to hospital.  Need help with FMLA."  SW will help patient with FMLA paperwork. Respirations even and unlabored.  No signs/symptoms of pain/distress noted on patient's face/body movements.  Safety maintained with 15 minute checks.

## 2016-07-21 NOTE — Progress Notes (Signed)
D: Patient seen on dayroom working puzzles. Cheerful and pleasant upon approach. Stated "I see some improvement". Denies active SI, HI, AH/VH. Reports wrist pain of 5/10. No further complaints.  A: Staff encouraged patient to continue with the treatment plans and verbalize needs to staff. Due med given as ordered. Routine safety checks maintained. Will continue to monitor patient.  R: Patient remains safe on unit.

## 2016-07-21 NOTE — Progress Notes (Signed)
Per pt request, CSW called pt's job ([employer] [phone] ext [phone]). Pt's manager, Magda Paganini, was unavailable. CSW left a message and will try to call again later.  Georga Kaufmann, MSW, [place]

## 2016-07-21 NOTE — Progress Notes (Signed)
[facility] MD Progress Note  07/21/2016 12:58 PM Sitlaly Gudiel Renault  MRN:  [id number] Subjective: Patient reports some improvement , but continues to report a sense of depression, sadness, and anxiety. She describes intermittent passive SI, but denies any plan or intention of hurting self and is currently future oriented. She is concerned about her job, and wants to insure that her boss knows she is in hospital . She denies medication side effects. Thus far is tolerating Zoloft well .  Objective : I have discussed case with treatment team and have met with patient. Patient presents with partial improvement in mood and affect, but reports some ongoing depression, anxiety. She states " I feel I need to refresh on and to learn  coping skills to deal with my stress " . She is visible in day room, group participation is limited . Limited socialization with peers. Behavior is calm and in good control, and she is pleasant, cooperative on approach. Currently denies medication side effects. Describes intermittent passive SI, but denies plan or intention, contracts for safety on unit .   Principal Problem: Bipolar Disorder Depressed  Diagnosis:   Patient Active Problem List   Diagnosis Date Noted  . Bipolar 1 disorder ([place]) [F31.9] 07/17/2016  . Low back pain [M54.5] 06/27/2016  . Left wrist injury, subsequent encounter [S69.92XD] 06/27/2016  . Pain of left thumb [M79.645] 10/07/2015  . 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 Strain of left thumb [IMO0002] 02/11/2015  . Strain of right forearm [S56.911A] 02/11/2015  . Right shoulder pain [M25.511] 12/31/2014  . Injury of right little finger [S69.91XA] 12/31/2014  . Episodic cluster headache, not intractable [G44.019] 12/02/2014  . 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 Hypersomnia, recurrent [G47.13] 12/02/2014  . Migraine aura, persistent, intractable, with  status migrainosus [G43.511] 12/02/2014  . Lower back injury [S39.92XA] 09/30/2014  . Bipolar I disorder, most recent episode depressed ([place]) [F31.30]   . MDD (major depressive disorder), recurrent, severe, with psychosis ([place]) [F33.3] 09/23/2014  . Injury of left index finger [S69.92XA] 08/20/2014  . Right ankle sprain [S93.401A] 06/01/2014  . Contusion, multiple sites [T07.XXXA] 06/01/2014  . Strain of right gastrocnemius muscle [S86.111A] 06/01/2014  . Phonophobia [F40.298] 05/04/2014  . Photophobia of both eyes [H53.143] 05/04/2014  . 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 Nausea with vomiting [R11.2] 05/04/2014  . Mixed bipolar I disorder ([place]) [F31.60]   . Bipolar I disorder, most recent episode mixed ([place]) [F31.60] 04/18/2014  . Bipolar affective disorder, depressed, mild ([place]) [F31.31] 04/12/2014  . Suicidal ideation [R45.851] 04/12/2014  . Injury of right shoulder and upper arm [S49.91XA] 02/17/2014  . Left leg pain [M79.605] 06/03/2013  . Right hip pain [M25.551] 06/03/2013  . Migraine with status migrainosus [G43.901] 01/08/2013  . Personality disorder [F60.9]   . Chronic migraine [G43.709] 05/08/2012  . Contact dermatitis [L25.9] 11/27/2011  . Major depressive disorder, recurrent episode ([place]) [F33.9] 10/27/2011  . Generalized anxiety disorder [F41.1] 10/27/2011  . 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 Borderline personality disorder [F60.3] 10/27/2011  . Right foot pain [M79.671] 09/28/2011  . 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 Ganglion cyst [M67.40] 09/29/2009  . 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 BIPOLAR DISORDER UNSPECIFIED [F31.9] 06/09/2008   Total Time spent with patient: 20 minutes Past Medical History:  Past Medical History:  Diagnosis Date  .  Achilles tendinitis   . Achilles tendinitis   . ADHD  (attention deficit hyperactivity disorder)   . Allergy   . Arthritis   . Bipolar affective ([place])   . Bipolar disorder ([place])   . Cataracts, bilateral   . Eczema   . Ganglion cyst 09/29/2009   left wrist (2 cyst)  . Hyperprolactinemia ([place])   . Hypertension   . Lipoma   . Migraine   . Personality disorder   . Pes planus     Past Surgical History:  Procedure Laterality Date  . ANKLE SURGERY  12/88   left   . chest nodule  1990?   rt chest wall nodule removal  . GANGLION CYST EXCISION  2011  . lipoma removal    . right bunioectomy    . SHOULDER SURGERY  01/13/2011   right, partial tear  . tumor resection left thigh     Family History:  Family History  Problem Relation Age of Onset  . Hypertension Mother   . Hyperlipidemia Mother   . Heart attack Father   . Heart disease Father   . Hypertension Father   . Bipolar disorder Father   . Diabetes Paternal Grandfather   . Heart disease Maternal Aunt   . Breast cancer Maternal Aunt   . Heart disease Maternal Grandmother   . Cancer Maternal Grandmother        colon   Social History:  History  Alcohol Use No     History  Drug Use No    Social History   Social History  . Marital status: Single    Spouse name: N/A  . Number of children: 0  . Years of education: N/A   Occupational History  .  [employer]   Social History Main Topics  . Smoking status: Never Smoker  . Smokeless tobacco: Never Used  . Alcohol use No  . Drug use: No  . Sexual activity: No   Other Topics Concern  . None   Social History Narrative   Caffeine  2 sodas daily, 1 cup coffee daily.   Additional Social History:    Pain Medications: see MAR Prescriptions: see MAR Over the Counter: see MAR History of alcohol / drug use?: No history of alcohol / drug abuse  Sleep: Fair  Appetite:  Fair  Current Medications: Current Facility-Administered Medications  Medication Dose Route Frequency Provider Last Rate Last Dose  . divalproex  (DEPAKOTE ER) 24 hr tablet 1,500 mg  1,500 mg Oral QHS Okonkwo, Justina A, NP   1,500 mg at 07/20/16 2252  . hydrOXYzine (ATARAX/VISTARIL) tablet 50 mg  50 mg Oral TID PRN Lu Duffel, Justina A, NP   50 mg at 07/21/16 0816  . ibuprofen (ADVIL,MOTRIN) tablet 400 mg  400 mg Oral Q8H PRN Cobos, Myer Peer, MD   400 mg at 07/21/16 0815  . lamoTRIgine (LAMICTAL) tablet 200 mg  200 mg Oral QHS Okonkwo, Justina A, NP   200 mg at 07/20/16 2253  . loratadine (CLARITIN) tablet 10 mg  10 mg Oral Daily Cobos, Myer Peer, MD   10 mg at 07/21/16 5537  . lurasidone (LATUDA) tablet 80 mg  80 mg Oral Q supper Okonkwo, Justina A, NP   80 mg at 07/20/16 1818  . metoprolol succinate (TOPROL-XL) 24 hr tablet 100 mg  100 mg Oral Daily Okonkwo, Justina A, NP   100 mg at 07/21/16 0813  . ondansetron (ZOFRAN-ODT) disintegrating tablet 8 mg  8 mg Oral Q8H PRN Lindon Romp  A, NP   8 mg at 07/20/16 2027  . sertraline (ZOLOFT) tablet 25 mg  25 mg Oral Daily Cobos, Myer Peer, MD   25 mg at 07/21/16 0813  . SUMAtriptan (IMITREX) tablet 50 mg  50 mg Oral Q12H PRN Okonkwo, Justina A, NP   50 mg at 07/18/16 1932  . traZODone (DESYREL) tablet 50 mg  50 mg Oral QHS,MR X 1 Laverle Hobby, PA-C   50 mg at 07/20/16 2252    Lab Results:  No results found for this or any previous visit (from the past 48 hour(s)).  Blood Alcohol level:  Lab Results  Component Value Date   ETH <5 07/15/2016   ETH <5 76/72/0947    Metabolic Disorder Labs: Lab Results  Component Value Date   HGBA1C 5.1 07/19/2016   MPG 100 07/19/2016   MPG 108 09/25/2014   Lab Results  Component Value Date   PROLACTIN 52.8 (H) 07/19/2016   PROLACTIN  08/21/2009    14.3 (NOTE)     Reference Ranges:                 Female:                       2.1 -  17.1 ng/ml                 Female:   Pregnant          9.7 - 208.5 ng/mL                           Non Pregnant      2.8 -  29.2 ng/mL                           Post  Menopausal   1.8 -  20.3 ng/mL                      Lab Results  Component Value Date   CHOL 178 07/19/2016   TRIG 132 07/19/2016   HDL 52 07/19/2016   CHOLHDL 3.4 07/19/2016   VLDL 26 07/19/2016   LDLCALC 100 (H) 07/19/2016   LDLCALC 96 09/25/2014    Physical Findings: AIMS:  , ,  ,  ,    CIWA:    COWS:     Musculoskeletal: Strength & Muscle Tone: within normal limits Gait & Station: normal Patient leans: N/A  Psychiatric Specialty Exam: Physical Exam  ROS reports nausea, no vomiting,no abdominal pain or distress, no fever, no chills   Blood pressure 103/70, pulse 77, temperature 97.7 F (36.5 C), resp. rate 16, height 5' 9" (1.753 m), weight 121.1 kg (267 lb), last menstrual period 07/08/2016, SpO2 100 %.Body mass index is 39.43 kg/m.  General Appearance: Well Groomed  Eye Contact:  Good  Speech:  Normal Rate  Volume:  Normal  Mood:  some improvement, but still depressed, anxious   Affect:  mildly constricted and anxious, tends to improve during session  Thought Process:  Goal Directed and Descriptions of Associations: Intact  Orientation:  Full (Time, Place, and Person)  Thought Content:  no hallucinations, no delusions   Suicidal Thoughts:  intermittent passive SI, at this time denies suicidal plan or intention and denies any self injurious ideations, contracts for safety on unit    Homicidal Thoughts:  No denies violent or homicidal ideations  Memory:  recent and remote grossly intact   Judgement:  Other:  improving  Insight:  Fair and improving   Psychomotor Activity:  Normal  Concentration:  Concentration: Good and Attention Span: Good  Recall:  Good  Fund of Knowledge:  Good  Language:  Negative  Akathisia:  Negative  Handed:  Right  AIMS (if indicated):     Assets:  Communication Skills Desire for Improvement Resilience  ADL's:  Intact  Cognition:  WNL  Sleep:  Number of Hours: 5.5   Assessment -  Patient is presenting with partial improvement compared to admission- mood is still depressed, but  improving compared to admission, affect remains anxious , but is more reactive. Has intermittent passive SI, but at this time denies any SI, contracts for safety, and presents future oriented. Tolerating medications well, including Zoloft, which was added recently to help address depression , anxiety.   Treatment Plan Summary: Treatment plan reviewed as below today 6/22 Daily contact with patient to assess and evaluate symptoms and progress in treatment, Medication management, Plan inpatient treatment  and medications as below Encourage group and milieu participation to work on coping skills and symptom reduction Continue Latuda 80 mgrs QDAY for mood disorder  Continue Depakote ER 1500 mgrs QHS for mood disorder  Continue Lamictal 200 mgrs QDAY for mood disorder Continue Claritin 10 mgrs QDAY for history of allergies   Increase Zoloft to 50 mgrs QDAY for depression Continue Trazodone 50 mgrs QHS PRN for insomnia Treatment team working on disposition planning options Jenne Campus, MD 07/21/2016, 12:58 PM   Patient ID: Mariam Helbert Sandy, female   DOB: Dec 14, 1970, 46 y.o.   MRN: [id number]

## 2016-07-22 ENCOUNTER — Encounter (HOSPITAL_COMMUNITY): Payer: Self-pay | Admitting: Behavioral Health

## 2016-07-22 NOTE — Progress Notes (Signed)
D: Pt  Passive SI, denies HI/AVH. Pt is pleasant and cooperative. Pt stated she was" better, don't feel as hopeless". Pt stated he medication change has helped . Pt felt nervous and anxious about the increase of zoloft to 50 mg. Pt concerned about taking an Antidepressant due to her previous provider not prescribing it for her due to possible SI thoughts. Pt encouraged to express her concerns after little education on the medication.   A: Pt was offered support and encouragement, extensive 1:1 time spent with patient. Pt was given scheduled medications. Pt was encourage to attend groups. Q 15 minute checks were done for safety.   R:Pt attends groups and interacts well with peers and staff. Pt is taking medication. Pt has no complaints.Pt receptive to treatment and safety maintained on unit.

## 2016-07-22 NOTE — Progress Notes (Signed)
Adult Psychoeducational Group Note  Date:  07/22/2016 Time:  10:00 am  Group Topic/Focus:  Identifying Needs:   The focus of this group is to help patients identify their personal needs that have been historically problematic and identify healthy behaviors to address their needs.  Participation Level:  Active  Participation Quality:  Appropriate  Affect:  Appropriate  Cognitive:  Appropriate  Insight: Appropriate  Engagement in Group:  Improving  Modes of Intervention:  Discussion and Education  Additional Comments:    Xander Jutras L 07/22/2016, 10:00 am

## 2016-07-22 NOTE — BHH Group Notes (Signed)
Adult Therapy Group Note (Clinical Social Work)  Date:  07/22/2016  Time:  11:00AM-12:00PM  Group Topic/Focus:  HEALTHY COPING SKILLS  Today's group focused on identifying healthy coping skills already in use by each patient, as well as healthy coping skills they would like to learn.  There was much sharing, support, psychoeducation, and encouragement provided.  Participation Level:  Active  Participation Quality:  Attentive, Sharing and Supportive  Affect:  Anxious, Blunted and Depressed  Cognitive:  Appropriate  Insight: Improving  Engagement in Group:  Developing/Improving  Modes of Intervention:  Discussion, Support and Processing  Additional Comments:  The patient shared that healthy coping already used is staying busy with something where she can see results, such as folding clothes at work.  She would like to refresh her coping skills, states that she cannot recognize how bad her depression is at times.  After group, she wanted to speak 1:1 with CSW and expressed disappointment in two friends from work who brought her to the hospital, but have not been in contact since.  She stated she wants to avoid them at work from now on, even if it means going out of her way.  One of them just suffered the loss of husband to suicide several months ago.  CSW tried to refocus her on her own boundaries, and other peoples' rights to boundaries as well, encouraged her not to make a snap decision about how to react to her friends not calling her, asked her to consider reasons not associated with her that could explain their lack of contact.  Also, encouraged her to start writing out what she will say to people at work about where she has been.  Berlin Hun Grossman-Orr 07/01/2016, 1:28 PM

## 2016-07-22 NOTE — Progress Notes (Signed)
Patient ID: [name], female   DOB: Jun 13, 1970, 46 y.o.   MRN: [id number] Patient reports a "good" night's sleep last night, reports that her sleep aid was helpful, reports her appetite as being "fair", reports her energy level as normal, and rates her depression as a "4", her level of hopelessness as "4", and her anxiety as "4".  Patient verbalizes +SI, states that it is "passive", and that she has no intent or plan to act on it, and verbally CFS on the unit, and tells RN that she will not do anything to hurt herself while hospitalized.    Patient complained of left thumb achy pain of 6/10 at 0830am, and was medicated with Motrin 400mg  at that time, and reported diminished pain to 3/10 at 930am. Patient reports that she fell prior to this hospitalization in her drive way at home and sustained an injury to her left thumb for which she now wears a left hand splint.  Fine and gross motor skills present in left hand, with no edema/redness observed.    Patient reported at start of shift that it was most important for her to work on attending all group sessions for the day, and that she would make it her goal to stay in the dayroom as much as possible.  Patient denies having any other concerns, denies HI/AVH, Q15 minute checks in place, will continue to monitor.  Addendum: Patient attended AM group sessions and ate all of her meals for today.  Patient stayed reclusive to her room in the afternoon and slept until 1730.

## 2016-07-22 NOTE — Plan of Care (Signed)
Problem: Coping: Goal: Ability to cope will improve Outcome: Progressing Pt SI- contracts for safety, "I will come to you if I feel anything"

## 2016-07-22 NOTE — Progress Notes (Signed)
[facility] MD Progress Note  07/22/2016 12:26 PM [name]  MRN:  [id number]  Subjective: "I have always had the thoughts of wanting to hurt myself but I wont do it. The thoughts are just there."  Objective : Face to face evaluation completed and chart reviewed. During this evaluation, patient is alert and oriented x3, calm, and cooperative. Patient is very pleasant. She notes overall improvement in depressive symptoms although she continues to endorse some passive suicidal thoughts as noted above but denies plan or intention. Patient denies homicidal ideas. She denies psychosis, delusions, or paranoia and does not appear to be internally preoccupied. She reports no alterations or difficulties in appetite or sleeping pattern. Reports medications are well tolerated and without side effects.Zoloft increased to 50 mg po daily starting today. No behavioral issues have been reported or observed while on the unit.She is able to contract for safety on the unit at this time.   Principal Problem: Bipolar Disorder Depressed  Diagnosis:   Patient Active Problem List   Diagnosis Date Noted  . Bipolar 1 disorder ([place]) [F31.9] 07/17/2016  . Low back pain [M54.5] 06/27/2016  . Left wrist injury, subsequent encounter [S69.92XD] 06/27/2016  . Pain of left thumb [M79.645] 10/07/2015  . 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 Strain of left thumb [IMO0002] 02/11/2015  . Strain of right forearm [S56.911A] 02/11/2015  . Right shoulder pain [M25.511] 12/31/2014  . Injury of right little finger [S69.91XA] 12/31/2014  . Episodic cluster headache, not intractable [G44.019] 12/02/2014  . 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 Hypersomnia, recurrent [G47.13] 12/02/2014  . 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 Lower back injury [S39.92XA] 09/30/2014  . Bipolar I disorder,  most recent episode depressed ([place]) [F31.30]   . MDD (major depressive disorder), recurrent, severe, with psychosis ([place]) [F33.3] 09/23/2014  . Injury of left index finger [S69.92XA] 08/20/2014  . Right ankle sprain [S93.401A] 06/01/2014  . Contusion, multiple sites [T07.XXXA] 06/01/2014  . Strain of right gastrocnemius muscle [S86.111A] 06/01/2014  . Phonophobia [F40.298] 05/04/2014  . Photophobia of both eyes [H53.143] 05/04/2014  . 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 Nausea with vomiting [R11.2] 05/04/2014  . Mixed bipolar I disorder ([place]) [F31.60]   . Bipolar I disorder, most recent episode mixed ([place]) [F31.60] 04/18/2014  . Bipolar affective disorder, depressed, mild ([place]) [F31.31] 04/12/2014  . Suicidal ideation [R45.851] 04/12/2014  . Injury of right shoulder and upper arm [S49.91XA] 02/17/2014  . Left leg pain [M79.605] 06/03/2013  . Right hip pain [M25.551] 06/03/2013  . Migraine with status migrainosus [G43.901] 01/08/2013  . Personality disorder [F60.9]   . Chronic migraine [G43.709] 05/08/2012  . Contact dermatitis [L25.9] 11/27/2011  . Major depressive disorder, recurrent episode ([place]) [F33.9] 10/27/2011  . Generalized anxiety disorder [F41.1] 10/27/2011  . 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 Borderline personality disorder [F60.3] 10/27/2011  . Right foot pain [M79.671] 09/28/2011  . 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 Ganglion cyst [M67.40] 09/29/2009  . 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 BIPOLAR DISORDER UNSPECIFIED [F31.9] 06/09/2008   Total Time spent with patient: 20 minutes Past Medical History:  Past Medical History:  Diagnosis Date  . Achilles tendinitis   . Achilles tendinitis   . ADHD (attention deficit hyperactivity disorder)   . Allergy   . Arthritis   .  Bipolar affective ([place])   .  Bipolar disorder ([place])   . Cataracts, bilateral   . Eczema   . Ganglion cyst 09/29/2009   left wrist (2 cyst)  . Hyperprolactinemia ([place])   . Hypertension   . Lipoma   . Migraine   . Personality disorder   . Pes planus     Past Surgical History:  Procedure Laterality Date  . ANKLE SURGERY  12/88   left   . chest nodule  1990?   rt chest wall nodule removal  . GANGLION CYST EXCISION  2011  . lipoma removal    . right bunioectomy    . SHOULDER SURGERY  01/13/2011   right, partial tear  . tumor resection left thigh     Family History:  Family History  Problem Relation Age of Onset  . Hypertension Mother   . Hyperlipidemia Mother   . Heart attack Father   . Heart disease Father   . Hypertension Father   . Bipolar disorder Father   . Diabetes Paternal Grandfather   . Heart disease Maternal Aunt   . Breast cancer Maternal Aunt   . Heart disease Maternal Grandmother   . Cancer Maternal Grandmother        colon   Social History:  History  Alcohol Use No     History  Drug Use No    Social History   Social History  . Marital status: Single    Spouse name: N/A  . Number of children: 0  . Years of education: N/A   Occupational History  .  [employer]   Social History Main Topics  . Smoking status: Never Smoker  . Smokeless tobacco: Never Used  . Alcohol use No  . Drug use: No  . Sexual activity: No   Other Topics Concern  . None   Social History Narrative   Caffeine  2 sodas daily, 1 cup coffee daily.   Additional Social History:    Pain Medications: see MAR Prescriptions: see MAR Over the Counter: see MAR History of alcohol / drug use?: No history of alcohol / drug abuse  Sleep: Fair  Appetite:  Fair  Current Medications: Current Facility-Administered Medications  Medication Dose Route Frequency Provider Last Rate Last Dose  . divalproex (DEPAKOTE ER) 24 hr tablet 1,500 mg  1,500 mg Oral QHS Okonkwo, Justina A, NP   1,500 mg at 07/21/16 2243  .  hydrOXYzine (ATARAX/VISTARIL) tablet 50 mg  50 mg Oral TID PRN Lu Duffel, Justina A, NP   50 mg at 07/22/16 0830  . ibuprofen (ADVIL,MOTRIN) tablet 400 mg  400 mg Oral Q8H PRN Cobos, Myer Peer, MD   400 mg at 07/22/16 0830  . lamoTRIgine (LAMICTAL) tablet 200 mg  200 mg Oral QHS Okonkwo, Justina A, NP   200 mg at 07/21/16 2243  . loratadine (CLARITIN) tablet 10 mg  10 mg Oral Daily Cobos, Myer Peer, MD   10 mg at 07/22/16 0816  . lurasidone (LATUDA) tablet 80 mg  80 mg Oral Q supper Okonkwo, Justina A, NP   80 mg at 07/21/16 1817  . metoprolol succinate (TOPROL-XL) 24 hr tablet 100 mg  100 mg Oral Daily Okonkwo, Justina A, NP   100 mg at 07/22/16 0817  . ondansetron (ZOFRAN-ODT) disintegrating tablet 8 mg  8 mg Oral Q8H PRN Lindon Romp A, NP   8 mg at 07/20/16 2027  . sertraline (ZOLOFT) tablet 50 mg  50 mg Oral Daily Cobos, Myer Peer, MD  50 mg at 07/22/16 0817  . SUMAtriptan (IMITREX) tablet 50 mg  50 mg Oral Q12H PRN Okonkwo, Justina A, NP   50 mg at 07/18/16 1932  . traZODone (DESYREL) tablet 50 mg  50 mg Oral QHS PRN Cobos, Myer Peer, MD   50 mg at 07/21/16 2243    Lab Results:  No results found for this or any previous visit (from the past 43 hour(s)).  Blood Alcohol level:  Lab Results  Component Value Date   ETH <5 07/15/2016   ETH <5 26/37/8588    Metabolic Disorder Labs: Lab Results  Component Value Date   HGBA1C 5.1 07/19/2016   MPG 100 07/19/2016   MPG 108 09/25/2014   Lab Results  Component Value Date   PROLACTIN 52.8 (H) 07/19/2016   PROLACTIN  08/21/2009    14.3 (NOTE)     Reference Ranges:                 Female:                       2.1 -  17.1 ng/ml                 Female:   Pregnant          9.7 - 208.5 ng/mL                           Non Pregnant      2.8 -  29.2 ng/mL                           Post  Menopausal   1.8 -  20.3 ng/mL                     Lab Results  Component Value Date   CHOL 178 07/19/2016   TRIG 132 07/19/2016   HDL 52 07/19/2016    CHOLHDL 3.4 07/19/2016   VLDL 26 07/19/2016   LDLCALC 100 (H) 07/19/2016   LDLCALC 96 09/25/2014    Physical Findings: AIMS: Facial and Oral Movements Muscles of Facial Expression: None, normal Lips and Perioral Area: None, normal Jaw: None, normal Tongue: None, normal,Extremity Movements Upper (arms, wrists, hands, fingers): None, normal Lower (legs, knees, ankles, toes): None, normal, Trunk Movements Neck, shoulders, hips: None, normal, Overall Severity Severity of abnormal movements (highest score from questions above): None, normal Incapacitation due to abnormal movements: None, normal Patient's awareness of abnormal movements (rate only patient's report): No Awareness, Dental Status Current problems with teeth and/or dentures?: No Does patient usually wear dentures?: No  CIWA:  CIWA-Ar Total: 1 COWS:  COWS Total Score: 1  Musculoskeletal: Strength & Muscle Tone: within normal limits Gait & Station: normal Patient leans: N/A  Psychiatric Specialty Exam: Physical Exam  Nursing note and vitals reviewed. Constitutional: She is oriented to person, place, and time.  Neurological: She is alert and oriented to person, place, and time.    Review of Systems  Psychiatric/Behavioral: Positive for depression and suicidal ideas. Negative for hallucinations, memory loss and substance abuse. The patient is not nervous/anxious and does not have insomnia.   All other systems reviewed and are negative.  reports nausea, no vomiting,no abdominal pain or distress, no fever, no chills   Blood pressure 109/74, pulse 69, temperature 98.3 F (36.8 C), resp. rate 16, height 5\' 9"  (1.753 m), weight 267 lb (121.1  kg), last menstrual period 07/08/2016, SpO2 100 %.Body mass index is 39.43 kg/m.  General Appearance: Well Groomed  Eye Contact:  Good  Speech:  Normal Rate  Volume:  Normal  Mood:  reports improvement.  Affect:  Appropriate  Thought Process:  Goal Directed and Descriptions of  Associations: Intact  Orientation:  Full (Time, Place, and Person)  Thought Content:  no hallucinations, no delusions   Suicidal Thoughts:  intermittent passive SI, at this time denies suicidal plan or intention and denies any self injurious ideations, contracts for safety on unit    Homicidal Thoughts:  No denies violent or homicidal ideations  Memory:  recent and remote grossly intact   Judgement:  Other:  improving  Insight:  Fair and improving   Psychomotor Activity:  Normal  Concentration:  Concentration: Good and Attention Span: Good  Recall:  Good  Fund of Knowledge:  Good  Language:  Negative  Akathisia:  Negative  Handed:  Right  AIMS (if indicated):     Assets:  Communication Skills Desire for Improvement Resilience  ADL's:  Intact  Cognition:  WNL  Sleep:  Number of Hours: 6   Assessment -  Patient continues to endorse passive SI however denies plan or intent. Her mood appears depressed although she notes some improvement overall. She is able to contract for safety on the unit. To reduce symptoms and improve overall level of function will continue the following treatment plan without adjustment;   Treatment Plan Summary: Daily contact with patient to assess and evaluate symptoms and progress in treatment, Medication management, Plan inpatient treatment  and medications as below Encourage group and milieu participation to work on coping skills and symptom reduction Continue Latuda 80 mgrs QDAY for mood disorder  Continue Depakote ER 1500 mgrs QHS for mood disorder  Continue Lamictal 200 mgrs QDAY for mood disorder Continue Claritin 10 mgrs QDAY for history of allergies   Contiue Zoloft to 50 mgrs QDAY for depression Continue Trazodone 50 mgrs QHS PRN for insomnia Treatment team working on disposition planning options Mordecai Maes, NP 07/22/2016, 12:26 PM   Patient ID: Adilee Lemme Brawley, female   DOB: [birth year], 46 y.o.   MRN: [id number]

## 2016-07-23 ENCOUNTER — Encounter (HOSPITAL_COMMUNITY): Payer: Self-pay | Admitting: Behavioral Health

## 2016-07-23 MED ORDER — TRAZODONE HCL 50 MG PO TABS
50.0000 mg | ORAL_TABLET | Freq: Every evening | ORAL | Status: DC | PRN
  Administered 2016-07-23 – 2016-07-24 (×2): 50 mg via ORAL
  Filled 2016-07-23 (×10): qty 1

## 2016-07-23 NOTE — BHH Suicide Risk Assessment (Signed)
[place] INPATIENT:  Family/Significant Other Suicide Prevention Education  Suicide Prevention Education:  Education Completed; Patient's mother, Hillery Zachman at [phone], has been identified by the patient as the family member/significant other with whom the patient will be residing, and identified as the person(s) who will aid the patient in the event of a mental health crisis (suicidal ideations/suicide attempt).  With written consent from the patient, the family member/significant other has been provided the following suicide prevention education, prior to the and/or following the discharge of the patient.  The suicide prevention education provided includes the following:  Suicide risk factors  Suicide prevention and interventions  National Suicide Hotline telephone number  [facility] assessment telephone number  [facility] Emergency Assistance [place] and/or Residential Mobile Crisis Unit telephone number  Request made of family/significant other to:  Remove weapons (e.g., guns, rifles, knives), all items previously/currently identified as safety concern.    Remove drugs/medications (over-the-counter, prescriptions, illicit drugs), all items previously/currently identified as a safety concern.  The family member/significant other verbalizes understanding of the suicide prevention education information provided.  The family member/significant other agrees to remove the items of safety concern listed above.  Sheilah Pigeon 07/23/2016, 2:30 PM

## 2016-07-23 NOTE — B[facility] Group Notes (Signed)
[facility] Therapy Notes:  (Clinical Social Work)   07/09/2016    1:00-2:00PM  Summary of Progress/Problems:   The main focus of today's process group was to   1)  discuss importance of adding supports to stay well once out of the hospital  2)  generate ideas about what healthy supports can be added   An emphasis was placed on using counselor, doctor, therapy groups, 12-step groups, and problem-specific support groups to expand supports.    The patient stated her current healthy supports include her mother "half the time," her friends, a therapist, and she is unsure of whether her Nurse Practitioner is all that helpful because of how many medications she had patient on prior to admission.  She would like to add some friends with whom she has commonalities, and part of her intention to do this is to restart participation in the [employer].  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  She acknowledged her black and white thinking.  Type of Therapy:  Process Group with Motivational Interviewing  Participation Level:  Active  Participation Quality:  Appropriate, Attentive, Sharing and Supportive  Affect:  Blunted and Depressed  Cognitive:  Oriented  Insight:  Developing/Improving  Engagement in Therapy:  Engaged  Modes of Intervention:   Education, Support and Processing  Selmer Dominion, LCSW 07/09/2016    2:17 PM

## 2016-07-23 NOTE — Progress Notes (Addendum)
D: Spoke with patient 1:1 who slept until 1000 SW group. States she is slowly making progress each day and depression is lessening. Patient's affect flat, blank with depressed, irritable mood. Some anxiety noted. "I feel really agitated today. My roommate is in the bathroom a lot, especially when I want to take a shower it seems." Patient was able to shower late morning. Rating depression at a 4/10, hopelessness at a 3/10 and anxiety at a 4/10. Rates sleep as fair-good, appetite as fair, energy as low and concentration as good.  Patient initially unable to think of a goal therefore processed with this Probation officer. Decided given her passive SI that "is there a lot of the time" her goal for today would be to "complete Suicide Prevention Plan." Reporting L wrist pain of a 5/10, no other physical problems.   A: Medicated per orders, advil prn given for discomfort, vistaril for anxiety and agitation. Level III obs in place for safety. Emotional support offered and self inventory reviewed. Offered to assist with completion of Suicide Safety Plan if needed and encouraged programming participation. Discussed POC with MD, SW.  Fall prevention plan in place and reviewed with patient.   R: Patient verbalizes understanding of POC, falls prevention education. On reassess, patient states pain is at a 4/10 and ice pack provided per her request. Patient endorsing passive SI "which isn't unusual for me" but denies plan or intent. Verbal contract in place for safety. No HI/AVH and remains safe on level III obs. Will continue to monitor and support.

## 2016-07-23 NOTE — Progress Notes (Signed)
[facility] MD Progress Note  07/23/2016 10:42 AM [name]  MRN:  [id number]  Subjective: "I am not real happy because they changed my medication without telling me."  Objective : Face to face evaluation completed and chart reviewed. During this evaluation, patient is alert and oriented x3, calm, and cooperative. Patient appears irritable this morning. She endorses being upset after learning that her Zoloft was increased without her knowing and that her dose of Trazodone was decreased as well. Writer spoke with patient yesterday about the increase of Zoloft and patient was receptive to this information yesterday. Patient appears to be focused on her Trazodone reporting that she was initially allowed to take a second dose as needed however she reports the order was changed las tnight. She endorses some sleep disturbance last night that would've required the second dose. Despite concerns with above note medications, she appears to be tolerating medications well without side effects. Patient continues to endorse some passive SI without plan or intent although she reports thoughts are lessening. She is able to contract for safety on the unit at this time. She endorses some depression and anxiety yet improvement with medications. She denies homicidal ideas. She denies psychosis, delusions, or paranoia and does not appear to be internally preoccupied.  No behavioral issues have been reported or observed while on the unit.  Principal Problem: Bipolar Disorder Depressed  Diagnosis:   Patient Active Problem List   Diagnosis Date Noted  . Bipolar 1 disorder ([place]) [F31.9] 07/17/2016  . Low back pain [M54.5] 06/27/2016  . Left wrist injury, subsequent encounter [S69.92XD] 06/27/2016  . Pain of left thumb [M79.645] 10/07/2015  . 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 Strain of left thumb [IMO0002] 02/11/2015  . Strain of right forearm [S56.911A] 02/11/2015  . Right shoulder pain [M25.511]  12/31/2014  . Injury of right little finger [S69.91XA] 12/31/2014  . Episodic cluster headache, not intractable [G44.019] 12/02/2014  . 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 Hypersomnia, recurrent [G47.13] 12/02/2014  . 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 Lower back injury [S39.92XA] 09/30/2014  . Bipolar I disorder, most recent episode depressed ([place]) [F31.30]   . MDD (major depressive disorder), recurrent, severe, with psychosis ([place]) [F33.3] 09/23/2014  . Injury of left index finger [S69.92XA] 08/20/2014  . Right ankle sprain [S93.401A] 06/01/2014  . Contusion, multiple sites [T07.XXXA] 06/01/2014  . Strain of right gastrocnemius muscle [S86.111A] 06/01/2014  . Phonophobia [F40.298] 05/04/2014  . Photophobia of both eyes [H53.143] 05/04/2014  . 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 Nausea with vomiting [R11.2] 05/04/2014  . Mixed bipolar I disorder ([place]) [F31.60]   . Bipolar I disorder, most recent episode mixed ([place]) [F31.60] 04/18/2014  . Bipolar affective disorder, depressed, mild ([place]) [F31.31] 04/12/2014  . Suicidal ideation [R45.851] 04/12/2014  . Injury of right shoulder and upper arm [S49.91XA] 02/17/2014  . Left leg pain [M79.605] 06/03/2013  . Right hip pain [M25.551] 06/03/2013  . Migraine with status migrainosus [G43.901] 01/08/2013  . Personality disorder [F60.9]   . Chronic migraine [G43.709] 05/08/2012  . Contact dermatitis [L25.9] 11/27/2011  . Major depressive disorder, recurrent episode ([place]) [F33.9] 10/27/2011  . Generalized anxiety disorder [F41.1] 10/27/2011  . 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 Borderline personality disorder [F60.3] 10/27/2011  . Right foot pain [M79.671] 09/28/2011  . Loss of transverse plantar arch [M21.6X9]  09/01/2011  .  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 Ganglion cyst [M67.40] 09/29/2009  . 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 BIPOLAR DISORDER UNSPECIFIED [F31.9] 06/09/2008   Total Time spent with patient: 20 minutes Past Medical History:  Past Medical History:  Diagnosis Date  . Achilles tendinitis   . Achilles tendinitis   . ADHD (attention deficit hyperactivity disorder)   . Allergy   . Arthritis   . Bipolar affective ([place])   . Bipolar disorder ([place])   . Cataracts, bilateral   . Eczema   . Ganglion cyst 09/29/2009   left wrist (2 cyst)  . Hyperprolactinemia ([place])   . Hypertension   . Lipoma   . Migraine   . Personality disorder   . Pes planus     Past Surgical History:  Procedure Laterality Date  . ANKLE SURGERY  12/88   left   . chest nodule  1990?   rt chest wall nodule removal  . GANGLION CYST EXCISION  2011  . lipoma removal    . right bunioectomy    . SHOULDER SURGERY  01/13/2011   right, partial tear  . tumor resection left thigh     Family History:  Family History  Problem Relation Age of Onset  . Hypertension Mother   . Hyperlipidemia Mother   . Heart attack Father   . Heart disease Father   . Hypertension Father   . Bipolar disorder Father   . Diabetes Paternal Grandfather   . Heart disease Maternal Aunt   . Breast cancer Maternal Aunt   . Heart disease Maternal Grandmother   . Cancer Maternal Grandmother        colon   Social History:  History  Alcohol Use No     History  Drug Use No    Social History   Social History  . Marital status: Single    Spouse name: N/A  . Number of children: 0  . Years of education: N/A   Occupational History  .  [employer]   Social History Main Topics  . Smoking status: Never Smoker  . Smokeless tobacco: Never Used  . Alcohol use No  . Drug use: No  . Sexual activity: No   Other Topics Concern  . None   Social History Narrative   Caffeine  2 sodas daily, 1 cup coffee daily.    Additional Social History:    Pain Medications: see MAR Prescriptions: see MAR Over the Counter: see MAR History of alcohol / drug use?: No history of alcohol / drug abuse  Sleep: intermiitment patterns between fair and poor   Appetite:  Fair  Current Medications: Current Facility-Administered Medications  Medication Dose Route Frequency Provider Last Rate Last Dose  . divalproex (DEPAKOTE ER) 24 hr tablet 1,500 mg  1,500 mg Oral QHS Okonkwo, Justina A, NP   1,500 mg at 07/22/16 2232  . hydrOXYzine (ATARAX/VISTARIL) tablet 50 mg  50 mg Oral TID PRN Hughie Closs A, NP   50 mg at 07/22/16 2232  . ibuprofen (ADVIL,MOTRIN) tablet 400 mg  400 mg Oral Q8H PRN Cobos, Myer Peer, MD   400 mg at 07/22/16 0830  . lamoTRIgine (LAMICTAL) tablet 200 mg  200 mg Oral QHS Okonkwo, Justina A, NP   200 mg at 07/22/16 2232  . loratadine (CLARITIN) tablet 10 mg  10 mg Oral Daily Cobos, Myer Peer, MD   10 mg at 07/22/16 0816  . lurasidone (LATUDA) tablet 80 mg  80 mg Oral Q supper Lu Duffel,  Justina A, NP   80 mg at 07/22/16 1832  . metoprolol succinate (TOPROL-XL) 24 hr tablet 100 mg  100 mg Oral Daily Okonkwo, Justina A, NP   100 mg at 07/22/16 0817  . ondansetron (ZOFRAN-ODT) disintegrating tablet 8 mg  8 mg Oral Q8H PRN Lindon Romp A, NP   8 mg at 07/20/16 2027  . sertraline (ZOLOFT) tablet 50 mg  50 mg Oral Daily Cobos, Myer Peer, MD   50 mg at 07/22/16 0817  . SUMAtriptan (IMITREX) tablet 50 mg  50 mg Oral Q12H PRN Okonkwo, Justina A, NP   50 mg at 07/18/16 1932  . traZODone (DESYREL) tablet 50 mg  50 mg Oral QHS PRN Cobos, Myer Peer, MD   50 mg at 07/22/16 2232    Lab Results:  No results found for this or any previous visit (from the past 48 hour(s)).  Blood Alcohol level:  Lab Results  Component Value Date   ETH <5 07/15/2016   ETH <5 78/93/8101    Metabolic Disorder Labs: Lab Results  Component Value Date   HGBA1C 5.1 07/19/2016   MPG 100 07/19/2016   MPG 108 09/25/2014    Lab Results  Component Value Date   PROLACTIN 52.8 (H) 07/19/2016   PROLACTIN  08/21/2009    14.3 (NOTE)     Reference Ranges:                 Female:                       2.1 -  17.1 ng/ml                 Female:   Pregnant          9.7 - 208.5 ng/mL                           Non Pregnant      2.8 -  29.2 ng/mL                           Post  Menopausal   1.8 -  20.3 ng/mL                     Lab Results  Component Value Date   CHOL 178 07/19/2016   TRIG 132 07/19/2016   HDL 52 07/19/2016   CHOLHDL 3.4 07/19/2016   VLDL 26 07/19/2016   LDLCALC 100 (H) 07/19/2016   LDLCALC 96 09/25/2014    Physical Findings: AIMS: Facial and Oral Movements Muscles of Facial Expression: None, normal Lips and Perioral Area: None, normal Jaw: None, normal Tongue: None, normal,Extremity Movements Upper (arms, wrists, hands, fingers): None, normal Lower (legs, knees, ankles, toes): None, normal, Trunk Movements Neck, shoulders, hips: None, normal, Overall Severity Severity of abnormal movements (highest score from questions above): None, normal Incapacitation due to abnormal movements: None, normal Patient's awareness of abnormal movements (rate only patient's report): No Awareness, Dental Status Current problems with teeth and/or dentures?: No Does patient usually wear dentures?: No  CIWA:  CIWA-Ar Total: 1 COWS:  COWS Total Score: 1  Musculoskeletal: Strength & Muscle Tone: within normal limits Gait & Station: normal Patient leans: N/A  Psychiatric Specialty Exam: Physical Exam  Nursing note and vitals reviewed. Constitutional: She is oriented to person, place, and time.  Neurological: She is alert and oriented to person, place, and  time.    Review of Systems  Psychiatric/Behavioral: Positive for depression and suicidal ideas. Negative for hallucinations, memory loss and substance abuse. The patient is not nervous/anxious and does not have insomnia.   All other systems reviewed and  are negative.  reports nausea, no vomiting,no abdominal pain or distress, no fever, no chills   Blood pressure 104/77, pulse 68, temperature 98.4 F (36.9 C), temperature source Oral, resp. rate 16, height 5\' 9"  (1.753 m), weight 267 lb (121.1 kg), last menstrual period 07/08/2016, SpO2 100 %.Body mass index is 39.43 kg/m.  General Appearance: Well Groomed  Eye Contact:  Good  Speech:  Normal Rate  Volume:  Normal  Mood:  upset; improvement in depressive symtpoms noted  Affect:  Appropriate  Thought Process:  Goal Directed and Descriptions of Associations: Intact  Orientation:  Full (Time, Place, and Person)  Thought Content:  no hallucinations, no delusions   Suicidal Thoughts:  intermittent passive SI, at this time denies suicidal plan or intention and denies any self injurious ideations, contracts for safety on unit    Homicidal Thoughts:  No denies violent or homicidal ideations  Memory:  recent and remote grossly intact   Judgement:  Other:  improving  Insight:  Fair and improving   Psychomotor Activity:  Normal  Concentration:  Concentration: Good and Attention Span: Good  Recall:  Good  Fund of Knowledge:  Good  Language:  Negative  Akathisia:  Negative  Handed:  Right  AIMS (if indicated):     Assets:  Communication Skills Desire for Improvement Resilience  ADL's:  Intact  Cognition:  WNL  Sleep:  Number of Hours: 6.5   Assessment -  Patient continues to endorse passive SI however denies plan or intent. She endorses improvement in mood and depressive symptoms. She is able to contract for safety on the unit. To reduce symptoms and improve overall level of function will continue the following treatment plan with adjustment in trazodone as noted;   Treatment Plan Summary: Daily contact with patient to assess and evaluate symptoms and progress in treatment, Medication management, Plan inpatient treatment  and medications as below Encourage group and milieu participation to  work on coping skills and symptom reduction Continue Latuda 80 mgrs QDAY for mood disorder  Continue Depakote ER 1500 mgrs QHS for mood disorder  Continue Lamictal 200 mgrs QDAY for mood disorder Continue Claritin 10 mgrs QDAY for history of allergies   Contiue Zoloft to 50 mgrs QDAY for depression Continue Trazodone 50 mgrs QHS PRN for insomnia. May repeat dose x1 Treatment team working on disposition planning options Mordecai Maes, NP 07/23/2016, 10:42 AM   Patient ID: [name], female   DOB: Jun 19, 1970, 46 y.o.   MRN: [id number]

## 2016-07-23 NOTE — [facility] Group Notes (Signed)
[place] Group Notes:  (Nursing/MHT/Case Management/Adjunct)  Date:  07/23/2016  Time:  3:27 PM  Type of Therapy:  Nurse Education  Participation Level:  Did Not Attend    Each patient was tasked with witting positive attributes about the other patients. Discussion followed about how positive statements can help self esteem  Cheri Kearns 07/23/2016, 3:27 PM

## 2016-07-23 NOTE — Plan of Care (Signed)
Problem: Education: Goal: Verbalization of understanding the information provided will improve Outcome: Progressing Patient verbalizes understanding of information, education provided.

## 2016-07-23 NOTE — Progress Notes (Signed)
Patient ID: [name], female   DOB: 07-Dec-1970, 46 y.o.   MRN: [id number]  Pt currently presents with a blunted affect and restless behavior. Pt reports to writer that their goal is to "think less suicidal thoughts." Pt seen with visitor sitting by side by side, no interaction. Pt reports good sleep with current medication regimen. Pt reports intermittent suicidal thoughts. Reports coping skills include listening to music, watching tv and "balancing" things.  Pt provided with medications per providers orders. Pt's labs and vitals were monitored throughout the night. Pt given a 1:1 about emotional and mental status. Pt supported and encouraged to express concerns and questions. Pt educated on medications, CBT and suicide prevention resources. Assisted pt to complete her SSP.   Pt's safety ensured with 15 minute and environmental checks. Pt endorses SI with a plan to cut herself or run her car into a tree. Denies plan while at [facility]. Pt currently HI and A/V hallucinations. Pt verbally agrees to seek staff if SI worsens, HI or A/VH occurs and to consult with staff before acting on any harmful thoughts. Will continue POC.

## 2016-07-23 NOTE — [facility] Group Notes (Signed)
Adult Psychoeducational Group Note  Date:  07/23/2016 Time:  10:06 PM  Group Topic/Focus:  Wrap-Up Group:   The focus of this group is to help patients review their daily goal of treatment and discuss progress on daily workbooks.  Participation Level:  Active  Participation Quality:  Appropriate  Affect:  Appropriate  Cognitive:  Appropriate  Insight: Appropriate  Engagement in Group:  Engaged  Modes of Intervention:  Discussion  Additional Comments:  Pt. Did not meet goal today. Pt. Stated that she slept majority of the day.  Wallace Going, Sharra Cayabyab E 07/23/2016, 10:06 PM

## 2016-07-24 NOTE — Progress Notes (Signed)
[facility] MD Progress Note  07/24/2016 12:25 PM [name]  MRN:  [id number] Subjective: patient reports increased anxiety as she approaches discharge. She does acknowledge some improvement in mood. She reports some lingering passive SI, but denies any plan or intention of hurting self . She is future oriented, and today focused on return to work issues. She also expresses interest in IOP level of care after discharge, if possible . One stressor is that she lives with her mother, whom she describes as often critical of her. Denies medication side effects- does describe fair appetite, but no nausea, no vomiting, no abdominal distress endorsed .   Objective : I have discussed case with treatment team and have met with patient. Patient presenting with partial improvement of mood and affect compared to admission . Does remain vaguely depressed, anxious, but responds to support, empathy, review of stressors, and affect improves during session, and brightens on approach. She is focusing on disposition planning options, and states she is worried how co-workers will interact with her after she returns to work after being in hospital . Medications well tolerated. No disruptive or agitated behaviors on unit .    Principal Problem: Bipolar Disorder Depressed  Diagnosis:   Patient Active Problem List   Diagnosis Date Noted  . Bipolar 1 disorder ([place]) [F31.9] 07/17/2016  . Low back pain [M54.5] 06/27/2016  . Left wrist injury, subsequent encounter [S69.92XD] 06/27/2016  . Pain of left thumb [M79.645] 10/07/2015  . 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 Strain of left thumb [IMO0002] 02/11/2015  . Strain of right forearm [S56.911A] 02/11/2015  . Right shoulder pain [M25.511] 12/31/2014  . Injury of right little finger [S69.91XA] 12/31/2014  . Episodic cluster headache, not intractable [G44.019] 12/02/2014  . 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 Hypersomnia, recurrent [G47.13] 12/02/2014  . 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 Lower back injury [S39.92XA] 09/30/2014  . Bipolar I disorder, most recent episode depressed ([place]) [F31.30]   . MDD (major depressive disorder), recurrent, severe, with psychosis ([place]) [F33.3] 09/23/2014  . Injury of left index finger [S69.92XA] 08/20/2014  . Right ankle sprain [S93.401A] 06/01/2014  . Contusion, multiple sites [T07.XXXA] 06/01/2014  . Strain of right gastrocnemius muscle [S86.111A] 06/01/2014  . Phonophobia [F40.298] 05/04/2014  . Photophobia of both eyes [H53.143] 05/04/2014  . 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 Nausea with vomiting [R11.2] 05/04/2014  . Mixed bipolar I disorder ([place]) [F31.60]   . Bipolar I disorder, most recent episode mixed ([place]) [F31.60] 04/18/2014  . Bipolar affective disorder, depressed, mild ([place]) [F31.31] 04/12/2014  . Suicidal ideation [R45.851] 04/12/2014  . Injury of right shoulder and upper arm [S49.91XA] 02/17/2014  . Left leg pain [M79.605] 06/03/2013  . Right hip pain [M25.551] 06/03/2013  . Migraine with status migrainosus [G43.901] 01/08/2013  . Personality disorder [F60.9]   . Chronic migraine [G43.709] 05/08/2012  . Contact dermatitis [L25.9] 11/27/2011  . Major depressive disorder, recurrent episode ([place]) [F33.9] 10/27/2011  . Generalized anxiety disorder [F41.1] 10/27/2011  . 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 Borderline personality disorder [F60.3] 10/27/2011  . Right foot pain [M79.671] 09/28/2011  . 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 Ganglion cyst [M67.40] 09/29/2009  . 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 BIPOLAR DISORDER UNSPECIFIED [F31.9] 06/09/2008   Total Time spent  with  patient: 20 minutes Past Medical History:  Past Medical History:  Diagnosis Date  . Achilles tendinitis   . Achilles tendinitis   . ADHD (attention deficit hyperactivity disorder)   . Allergy   . Arthritis   . Bipolar affective ([place])   . Bipolar disorder ([place])   . Cataracts, bilateral   . Eczema   . Ganglion cyst 09/29/2009   left wrist (2 cyst)  . Hyperprolactinemia ([place])   . Hypertension   . Lipoma   . Migraine   . Personality disorder   . Pes planus     Past Surgical History:  Procedure Laterality Date  . ANKLE SURGERY  12/88   left   . chest nodule  1990?   rt chest wall nodule removal  . GANGLION CYST EXCISION  2011  . lipoma removal    . right bunioectomy    . SHOULDER SURGERY  01/13/2011   right, partial tear  . tumor resection left thigh     Family History:  Family History  Problem Relation Age of Onset  . Hypertension Mother   . Hyperlipidemia Mother   . Heart attack Father   . Heart disease Father   . Hypertension Father   . Bipolar disorder Father   . Diabetes Paternal Grandfather   . Heart disease Maternal Aunt   . Breast cancer Maternal Aunt   . Heart disease Maternal Grandmother   . Cancer Maternal Grandmother        colon   Social History:  History  Alcohol Use No     History  Drug Use No    Social History   Social History  . Marital status: Single    Spouse name: N/A  . Number of children: 0  . Years of education: N/A   Occupational History  .  [employer]   Social History Main Topics  . Smoking status: Never Smoker  . Smokeless tobacco: Never Used  . Alcohol use No  . Drug use: No  . Sexual activity: No   Other Topics Concern  . None   Social History Narrative   Caffeine  2 sodas daily, 1 cup coffee daily.   Additional Social History:    Pain Medications: see MAR Prescriptions: see MAR Over the Counter: see MAR History of alcohol / drug use?: No history of alcohol / drug abuse  Sleep: improving   Appetite:   Fair  Current Medications: Current Facility-Administered Medications  Medication Dose Route Frequency Provider Last Rate Last Dose  . divalproex (DEPAKOTE ER) 24 hr tablet 1,500 mg  1,500 mg Oral QHS Okonkwo, Justina A, NP   1,500 mg at 07/23/16 2254  . hydrOXYzine (ATARAX/VISTARIL) tablet 50 mg  50 mg Oral TID PRN Lu Duffel, Justina A, NP   50 mg at 07/24/16 0640  . ibuprofen (ADVIL,MOTRIN) tablet 400 mg  400 mg Oral Q8H PRN Cobos, Myer Peer, MD   400 mg at 07/24/16 6010  . lamoTRIgine (LAMICTAL) tablet 200 mg  200 mg Oral QHS Okonkwo, Justina A, NP   200 mg at 07/23/16 2254  . loratadine (CLARITIN) tablet 10 mg  10 mg Oral Daily Cobos, Myer Peer, MD   10 mg at 07/24/16 0820  . lurasidone (LATUDA) tablet 80 mg  80 mg Oral Q supper Okonkwo, Justina A, NP   80 mg at 07/23/16 1817  . metoprolol succinate (TOPROL-XL) 24 hr tablet 100 mg  100 mg Oral Daily Okonkwo, Justina A, NP   100 mg at 07/24/16  0177  . ondansetron (ZOFRAN-ODT) disintegrating tablet 8 mg  8 mg Oral Q8H PRN Lindon Romp A, NP   8 mg at 07/24/16 1206  . sertraline (ZOLOFT) tablet 50 mg  50 mg Oral Daily Cobos, Myer Peer, MD   50 mg at 07/24/16 0820  . SUMAtriptan (IMITREX) tablet 50 mg  50 mg Oral Q12H PRN Okonkwo, Justina A, NP   50 mg at 07/18/16 1932  . traZODone (DESYREL) tablet 50 mg  50 mg Oral QHS,MR X 1 Mordecai Maes, NP   50 mg at 07/23/16 2254    Lab Results:  No results found for this or any previous visit (from the past 48 hour(s)).  Blood Alcohol level:  Lab Results  Component Value Date   ETH <5 07/15/2016   ETH <5 93/90/3009    Metabolic Disorder Labs: Lab Results  Component Value Date   HGBA1C 5.1 07/19/2016   MPG 100 07/19/2016   MPG 108 09/25/2014   Lab Results  Component Value Date   PROLACTIN 52.8 (H) 07/19/2016   PROLACTIN  08/21/2009    14.3 (NOTE)     Reference Ranges:                 Female:                       2.1 -  17.1 ng/ml                 Female:   Pregnant          9.7 - 208.5  ng/mL                           Non Pregnant      2.8 -  29.2 ng/mL                           Post  Menopausal   1.8 -  20.3 ng/mL                     Lab Results  Component Value Date   CHOL 178 07/19/2016   TRIG 132 07/19/2016   HDL 52 07/19/2016   CHOLHDL 3.4 07/19/2016   VLDL 26 07/19/2016   LDLCALC 100 (H) 07/19/2016   LDLCALC 96 09/25/2014    Physical Findings: AIMS: Facial and Oral Movements Muscles of Facial Expression: None, normal Lips and Perioral Area: None, normal Jaw: None, normal Tongue: None, normal,Extremity Movements Upper (arms, wrists, hands, fingers): None, normal Lower (legs, knees, ankles, toes): None, normal, Trunk Movements Neck, shoulders, hips: None, normal, Overall Severity Severity of abnormal movements (highest score from questions above): None, normal Incapacitation due to abnormal movements: None, normal Patient's awareness of abnormal movements (rate only patient's report): No Awareness, Dental Status Current problems with teeth and/or dentures?: No Does patient usually wear dentures?: No  CIWA:  CIWA-Ar Total: 1 COWS:  COWS Total Score: 1  Musculoskeletal: Strength & Muscle Tone: within normal limits Gait & Station: normal Patient leans: N/A  Psychiatric Specialty Exam: Physical Exam  ROS reports nausea, no vomiting,no abdominal pain or distress, no fever, no chills   Blood pressure 114/76, pulse 66, temperature 98.6 F (37 C), temperature source Oral, resp. rate 18, height 5' 9" (1.753 m), weight 121.1 kg (267 lb), last menstrual period 07/08/2016, SpO2 100 %.Body mass index is 39.43 kg/m.  General Appearance: Well Groomed  Eye  Contact:  Good  Speech:  Normal Rate  Volume:  Normal  Mood:  improving , less severely depressed, remains anxious  Affect:  more reactive, remains anxious   Thought Process:  Linear and Descriptions of Associations: Intact  Orientation:  Other:  fully alert and attentive  Thought Content:  no psychotic  symptoms  Suicidal Thoughts:  No describes some lingering residual passive SI, but denies any plan or intention of suicide or any thoughts of hurting self, contracts for safety on unit   Homicidal Thoughts:  No denies violent or homicidal ideations  Memory:  recent and remote grossly intact   Judgement:  Other:  improving  Insight:  improving   Psychomotor Activity:  Normal  Concentration:  Concentration: Good and Attention Span: Good  Recall:  Good  Fund of Knowledge:  Good  Language:  Negative  Akathisia:  Negative  Handed:  Right  AIMS (if indicated):     Assets:  Communication Skills Desire for Improvement Resilience  ADL's:  Intact  Cognition:  WNL  Sleep:  Number of Hours: 5.75   Assessment -  Patient's mood is improved compared to admission . Presents with fuller range of affect. She reports some lingering passive SI, but denies plan or intention of hurting self and is currently future oriented. She does remain anxious, and ruminates about her mother being hyper-critical and about co-workers asking her questions after she returns to work after being away . She responds to reassurance, support, review of stressors. Thus far tolerating medications well, including Zoloft antidepressant trial.   Treatment Plan Summary: Treatment plan reviewed as below today 6/25 Daily contact with patient to assess and evaluate symptoms and progress in treatment, Medication management, Plan inpatient treatment  and medications as below Encourage group and milieu participation to work on coping skills and symptom reduction Continue Latuda 80 mgrs QDAY for mood disorder  Continue Depakote ER 1500 mgrs QHS for mood disorder  Continue Lamictal 200 mgrs QDAY for mood disorder Continue Claritin 10 mgrs QDAY for history of allergies   Continue  Zoloft  50 mgrs QDAY for depression Continue Trazodone 50 mgrs QHS PRN for insomnia Treatment team working on disposition planning options- patient is expressing  interest in IOP level of care after discharge Jenne Campus, MD 07/24/2016, 12:25 PM   Patient ID: [name], female   DOB: 1970-09-27, 46 y.o.   MRN: [id number]

## 2016-07-24 NOTE — Plan of Care (Signed)
Problem: Coping: Goal: Ability to demonstrate self-control will improve Outcome: Progressing Pt increasingly anxious today. Pt was able to verbalize feelings to Probation officer and utilize coping skills.

## 2016-07-24 NOTE — Progress Notes (Signed)
Adult Psychoeducational Group Note  Date:  07/24/2016 Time:  2:19 PM  Group Topic/Focus:  Recovery Goals:   The focus of this group is to identify appropriate goals for recovery and establish a plan to achieve them.  Participation Level:  Active  Participation Quality:  Attentive  Affect:  Appropriate  Cognitive:  Appropriate  Insight: Good  Engagement in Group:  Engaged  Modes of Intervention:  Discussion  Additional Comments:  Pt did participate in group today.  Pt states that she was suicidal upon arrival, she states that she also suffers with bi-polar disorder as well as borderline personality disorder.  Pt states that her friends husband had committed suicide and that it was a trigger for her.  Pt states that she is still somewhat anxious today but will be talking to the doctor soon.  Shrihan Putt R Makyla Bye 07/24/2016, 2:19 PM

## 2016-07-24 NOTE — Progress Notes (Signed)
Patient ID: [name], female   DOB: 20-May-1970, 46 y.o.   MRN: [id number]   Pt currently presents with a blunted affect and cooperative behavior. Pt reports to Probation officer that their goal is to "go home tomorrow, maybe." Pt states "I think I am ready." Reports some anxiety over leaving. Pt reports good sleep with current medication regimen.   Pt provided with medications per providers orders. Pt's labs and vitals were monitored throughout the night. Pt given a 1:1 about emotional and mental status. Pt supported and encouraged to express concerns and questions. Pt educated on medications and assertiveness.  Pt's safety ensured with 15 minute and environmental checks. Pt endorses a "tiny" amount of SI today. This is less than patients normal report during this admission. Pt currently denies HI and A/V hallucinations. Pt verbally agrees to seek staff if SI/HI or A/VH occurs and to consult with staff before acting on any harmful thoughts. Pt reports hope about attending IOP program post discharge. Will continue POC.

## 2016-07-24 NOTE — Progress Notes (Signed)
Recreation Therapy Notes  Date: 07/24/16 Time: 0930 Location: [address] Dayroom  Group Topic: Stress Management  Goal Area(s) Addresses:  Patient will verbalize importance of using healthy stress management.  Patient will identify positive emotions associated with healthy stress management.   Intervention: Stress Management  Activity :  [place].  LRT introduced the stress management technique of guided imagery.  LRT read a script to allow patients the opportunity for take a mental journey to the beach.  Patients were to follow along as LRT read script to engage in the technique.  Education: Stress Management, Discharge Planning.   Education Outcome: Acknowledges edcuation/In group clarification offered/Needs additional education  Clinical Observations/Feedback: Pt did not attend group.   Zurich Carreno Ria Comment, LRT/CTRS        Ria Comment, Delmon Andrada A 07/24/2016 1:06 PM

## 2016-07-24 NOTE — Tx Team (Signed)
Interdisciplinary Treatment and Diagnostic Plan Update 07/24/2016 Time of Session: 9:30am  [name]  MRN: [id number]  Principal Diagnosis: Bipolar Disorder, Depressed   Secondary Diagnoses: Active Problems:   Bipolar 1 disorder (HCC)   Current Medications:  Current Facility-Administered Medications  Medication Dose Route Frequency Provider Last Rate Last Dose  . divalproex (DEPAKOTE ER) 24 hr tablet 1,500 mg  1,500 mg Oral QHS Okonkwo, Justina A, NP   1,500 mg at 07/23/16 2254  . hydrOXYzine (ATARAX/VISTARIL) tablet 50 mg  50 mg Oral TID PRN Lu Duffel, Justina A, NP   50 mg at 07/24/16 0640  . ibuprofen (ADVIL,MOTRIN) tablet 400 mg  400 mg Oral Q8H PRN Cobos, Myer Peer, MD   400 mg at 07/24/16 0932  . lamoTRIgine (LAMICTAL) tablet 200 mg  200 mg Oral QHS Okonkwo, Justina A, NP   200 mg at 07/23/16 2254  . loratadine (CLARITIN) tablet 10 mg  10 mg Oral Daily Cobos, Myer Peer, MD   10 mg at 07/24/16 0820  . lurasidone (LATUDA) tablet 80 mg  80 mg Oral Q supper Okonkwo, Justina A, NP   80 mg at 07/23/16 1817  . metoprolol succinate (TOPROL-XL) 24 hr tablet 100 mg  100 mg Oral Daily Okonkwo, Justina A, NP   100 mg at 07/24/16 0942  . ondansetron (ZOFRAN-ODT) disintegrating tablet 8 mg  8 mg Oral Q8H PRN Lindon Romp A, NP   8 mg at 07/24/16 1206  . sertraline (ZOLOFT) tablet 50 mg  50 mg Oral Daily Cobos, Myer Peer, MD   50 mg at 07/24/16 0820  . SUMAtriptan (IMITREX) tablet 50 mg  50 mg Oral Q12H PRN Okonkwo, Justina A, NP   50 mg at 07/18/16 1932  . traZODone (DESYREL) tablet 50 mg  50 mg Oral QHS,MR X 1 Mordecai Maes, NP   50 mg at 07/23/16 2254    PTA Medications: Prescriptions Prior to Admission  Medication Sig Dispense Refill Last Dose  . azelastine (ASTELIN) 0.1 % nasal spray instill 1 to 2 sprays into each nostril twice a day  0 Taking  . divalproex (DEPAKOTE ER) 500 MG 24 hr tablet take 3 tablets by mouth at bedtime 90 tablet 1 07/14/2016 at Unknown time  .  hydrocortisone cream 0.5 % Apply 1 application topically daily as needed for itching.   Taking  . hydrOXYzine (VISTARIL) 50 MG capsule take 1 capsule by mouth twice a day and 1 capsule at bedtime  0 07/14/2016 at Unknown time  . lamoTRIgine (LAMICTAL) 200 MG tablet Take 1 tablet (200 mg total) by mouth at bedtime. For mood stabilization (Patient taking differently: Take 300 mg by mouth at bedtime. For mood stabilization) 30 tablet 0 07/14/2016 at Unknown time  . Lurasidone HCl 120 MG TABS Take 1 tablet by mouth every evening.   07/14/2016 at Unknown time  . meloxicam (MOBIC) 15 MG tablet Take 1 tablet (15 mg total) by mouth daily. (Patient not taking: Reported on 07/15/2016) 30 tablet 2 Completed Course at Unknown time  . metaxalone (SKELAXIN) 800 MG tablet Take 1 tablet (800 mg total) by mouth 3 (three) times daily as needed for muscle spasms. 60 tablet 1 Taking  . metoprolol succinate (TOPROL-XL) 100 MG 24 hr tablet Take 1 tablet (100 mg total) by mouth daily. Take with or immediately following a meal for blood pressure control. 30 tablet 0 07/15/2016 at Unknown time  . Multiple Vitamin (MULTIVITAMIN WITH MINERALS) TABS tablet Take 1 tablet by mouth daily. For nutritional supplementation.  30 tablet 0 07/15/2016 at Unknown time  . ondansetron (ZOFRAN) 4 MG tablet Take 1 tablet (4 mg total) by mouth every 8 (eight) hours as needed for nausea or vomiting. 20 tablet 0 07/14/2016 at Unknown time  . Polyvinyl Alcohol-Povidone (REFRESH OP) Apply 1 tablet to eye daily as needed. For dry eyes   Taking  . promethazine (PHENERGAN) 25 MG tablet Take 1 tablet (25 mg total) by mouth every 6 (six) hours as needed for nausea or vomiting (or headache). 12 tablet 0 Taking  . rOPINIRole (REQUIP) 2 MG tablet    07/14/2016 at Unknown time  . sodium chloride 0.9 % SOLN 100 mL with valproate 500 MG/5ML SOLN 500 mg Inject 500 mg into the vein once as needed. To be infused over  a 15 minutes period, repeat once if partial relief.    Taking  . sodium chloride 0.9 % SOLN 100 mL with valproate 500 MG/5ML SOLN 500 mg Inject 500 mg into the vein once as needed. To be infused over  a 15 minutes period, repeat once if partial relief. 1000 mg 1   . SUMAtriptan 6 MG/0.5ML SOAJ Inject 6 mg into the skin as needed. 4 Cartridge 5 07/14/2016 at Unknown time  . zolpidem (AMBIEN) 10 MG tablet Take 10 mg by mouth at bedtime.   07/14/2016 at Unknown time    Treatment Modalities: Medication Management, Group therapy, Case management,  1 to 1 session with clinician, Psychoeducation, Recreational therapy.  Patient Stressors: Health problems Medication change or noncompliance Patient Strengths: Motivation for treatment/growth Religious Affiliation Supportive family/friends  Physician Treatment Plan for Primary Diagnosis: Bipolar Disorder, Depressed  Long Term Goal(s): Improvement in symptoms so as ready for discharge Short Term Goals: Ability to verbalize feelings will improve Ability to disclose and discuss suicidal ideas Ability to demonstrate self-control will improve Ability to identify and develop effective coping behaviors will improve Ability to maintain clinical measurements within normal limits will improve Ability to verbalize feelings will improve Ability to disclose and discuss suicidal ideas Ability to demonstrate self-control will improve Ability to identify and develop effective coping behaviors will improve Ability to maintain clinical measurements within normal limits will improve Compliance with prescribed medications will improve  Medication Management: Evaluate patient's response, side effects, and tolerance of medication regimen.  Therapeutic Interventions: 1 to 1 sessions, Unit Group sessions and Medication administration.  Evaluation of Outcomes: Progressing  Physician Treatment Plan for Secondary Diagnosis: Active Problems:   Bipolar 1 disorder ([place])  Long Term Goal(s): Improvement in symptoms so as ready  for discharge  Short Term Goals: Ability to verbalize feelings will improve Ability to disclose and discuss suicidal ideas Ability to demonstrate self-control will improve Ability to identify and develop effective coping behaviors will improve Ability to maintain clinical measurements within normal limits will improve Ability to verbalize feelings will improve Ability to disclose and discuss suicidal ideas Ability to demonstrate self-control will improve Ability to identify and develop effective coping behaviors will improve Ability to maintain clinical measurements within normal limits will improve Compliance with prescribed medications will improve  Medication Management: Evaluate patient's response, side effects, and tolerance of medication regimen.  Therapeutic Interventions: 1 to 1 sessions, Unit Group sessions and Medication administration.  Evaluation of Outcomes: Progressing  RN Treatment Plan for Primary Diagnosis: Bipolar Disorder, Depressed  Long Term Goal(s): Knowledge of disease and therapeutic regimen to maintain health will improve  Short Term Goals: Compliance with prescribed medications will improve  Medication Management: RN will administer medications as  ordered by provider, will assess and evaluate patient's response and provide education to patient for prescribed medication. RN will report any adverse and/or side effects to prescribing provider.  Therapeutic Interventions: 1 on 1 counseling sessions, Psychoeducation, Medication administration, Evaluate responses to treatment, Monitor vital signs and CBGs as ordered, Perform/monitor CIWA, COWS, AIMS and Fall Risk screenings as ordered, Perform wound care treatments as ordered.  Evaluation of Outcomes: Progressing  LCSW Treatment Plan for Primary Diagnosis: Bipolar Disorder, Depressed  Long Term Goal(s): Safe transition to appropriate next level of care at discharge, Engage patient in therapeutic group addressing  interpersonal concerns. Short Term Goals: Engage patient in aftercare planning with referrals and resources, Increase ability to appropriately verbalize feelings, Identify triggers associated with mental health/substance abuse issues and Increase skills for wellness and recovery  Therapeutic Interventions: Assess for all discharge needs, 1 to 1 time with Social worker, Explore available resources and support systems, Assess for adequacy in community support network, Educate family and significant other(s) on suicide prevention, Complete Psychosocial Assessment, Interpersonal group therapy.  Evaluation of Outcomes: Progressing  Progress in Treatment: Attending groups: Yes Participating in groups: Yes Taking medication as prescribed: Yes, MD continues to assess for medication changes as needed Toleration medication: Yes, no side effects reported at this time Family/Significant other contact made: Yes, with pt's mother Patient understands diagnosis: Continuing to assess Discussing patient identified problems/goals with staff: Yes Medical problems stabilized or resolved: Yes Denies suicidal/homicidal ideation: No, pt recently admitted for SI Issues/concerns per patient self-inventory: None Other: N/A  New problem(s) identified: None identified at this time.   New Short Term/Long Term Goal(s): None identified at this time.   Discharge Plan or Barriers: Pt will return home and follow up with an outpatient provider.  Reason for Continuation of Hospitalization:  Anxiety  Depression Medication stabilization Suicidal ideation  Estimated Length of Stay: 1-2 days; Estimated discharge date 07/25/16  Attendees: Patient: 07/24/2016 4:26 PM  Physician: Dr. Parke Poisson 07/24/2016 4:26 PM  Nursing: Trinna Post RN; Sharl Ma, RN 07/24/2016 4:26 PM  RN Care Manager: Lars Pinks, RN 07/24/2016 4:26 PM  Social Worker: Matthew Saras, [place] 07/24/2016 4:26 PM  Recreational Therapist:  07/24/2016 4:26 PM  Other:   07/24/2016 4:26 PM  Other:  07/24/2016 4:26 PM  Other: 07/24/2016 4:26 PM   Scribe for Treatment Team: Georga Kaufmann, MSW,LCSWA 07/24/2016 4:26 PM

## 2016-07-24 NOTE — Progress Notes (Signed)
D: Pt presents with flat affect and depressed mood. Pt rates depression 3/10. Anxiety 7/10. Pt endorses passive SI with no plan of intent. Pt verbally contracts for safety. Pt reported feeling increasingly anxious today not knowing  whether or not she's stable enough to be discharged. Pt appears to worry a lot throughout the day and then becomes increasingly anxious.  Pt compliant with taking meds. No side effects to meds verbalized by pt.  A: Medications reviewed with pt. Mediations administered as ordered per MD. Verbal support provided. Pt encouraged to attend groups. 15 minute checks performed for safety. R: Pt receptive to tx.

## 2016-07-24 NOTE — BHH Group Notes (Signed)
[place] LCSW Group Therapy  07/24/2016 1:15pm  Type of Therapy: Group Therapy   Topic: Overcoming Obstacles  Participation Level: Active  Participation Quality: Appropriate   Affect: Appropriate  Cognitive: Appropriate and Oriented  Insight: Developing/Improving and Improving  Engagement in Therapy: Improving  Modes of Intervention: Discussion, Exploration, Problem-solving and Support  Description of Group:  In this group patients will be encouraged to explore what they see as obstacles to their own wellness and recovery. They will be guided to discuss their thoughts, feelings, and behaviors related to these obstacles. The group will process together ways to cope with barriers, with attention given to specific choices patients can make. Each patient will be challenged to identify changes they are motivated to make in order to overcome their obstacles. This group will be process-oriented, with patients participating in exploration of their own experiences as well as giving and receiving support and challenge from other group members.  Summary of Patient Progress:  Pt states that her main obstacle is figuring out how to answer the questions of coworkers when she returns to work. Pt is unsure about how much she should share with others about her stay in the hospital.   Therapeutic Modalities:  Cognitive Behavioral Therapy Solution Focused Therapy Motivational Interviewing Relapse Prevention [place], MSW, [place]

## 2016-07-25 MED ORDER — TRAZODONE HCL 50 MG PO TABS: 50.0000 mg | ORAL_TABLET | Freq: Every evening | ORAL | 0 refills | Status: DC | PRN

## 2016-07-25 MED ORDER — LURASIDONE HCL 80 MG PO TABS: 80.0000 mg | ORAL_TABLET | Freq: Every day | ORAL | 0 refills | Status: DC

## 2016-07-25 MED ORDER — LAMOTRIGINE 200 MG PO TABS: 200.0000 mg | ORAL_TABLET | Freq: Every day | ORAL | 0 refills | Status: DC

## 2016-07-25 MED ORDER — DIVALPROEX SODIUM ER 500 MG PO TB24: 1500.0000 mg | ORAL_TABLET | Freq: Every day | ORAL | 0 refills | Status: DC

## 2016-07-25 MED ORDER — METOPROLOL SUCCINATE ER 100 MG PO TB24: 100.0000 mg | ORAL_TABLET | Freq: Every day | ORAL | 0 refills | Status: DC

## 2016-07-25 MED ORDER — HYDROXYZINE HCL 50 MG PO TABS: 50.0000 mg | ORAL_TABLET | Freq: Three times a day (TID) | ORAL | 0 refills | Status: DC | PRN

## 2016-07-25 MED ORDER — SERTRALINE HCL 50 MG PO TABS: 50.0000 mg | ORAL_TABLET | Freq: Every day | ORAL | 0 refills | Status: DC

## 2016-07-25 NOTE — Progress Notes (Signed)
Recreation Therapy Notes  Animal-Assisted Activity (AAA) Program Checklist/Progress Notes Patient Eligibility Criteria Checklist & Daily Group note for Rec TxIntervention  Date: 06.26.2018 Time: 2:45pm Location: [address]   AAA/T Program Assumption of Risk Form signed by Patient/ or Parent Legal Guardian Yes  Patient is free of allergies or sever asthma Yes  Patient reports no fear of animals Yes  Patient reports no history of cruelty to animals Yes  Patient understands his/her participation is voluntary Yes  Behavioral Response: Did not attend.   Laureen Ochs Viet Kemmerer, LRT/CTRS         Mckenleigh Tarlton L 07/25/2016 3:07 PM

## 2016-07-25 NOTE — Progress Notes (Signed)
  [facility] Adult Case Management Discharge Plan :  Will you be returning to the same living situation after discharge:  Yes,  pt returning home with family. At discharge, do you have transportation home?: Yes,  pt has access to transportation. Do you have the ability to pay for your medications: Yes,  pt has insurance.  Release of information consent forms completed and in the chart;  Patient's signature needed at discharge.  Patient to Follow up at: Follow-up [facility] Follow up on 08/07/2016.   Why:  at 12:30pm for medication management with Pauline Good, NP Contact information: [address] [place], [place] [address] Phone: [phone] Fax: [phone]       [facility] Follow up on 07/27/2016.   Why:  Therapy appointment with Ruben Reason at [facility] information: [place],  [place], [place] [address] Phone: [facility] Follow up on 07/26/2016.   Specialty:  Behavioral Health Why:  Intensive Outpatient assessment appointment @8 :45am. Contact information: [place] [phone]          Next level of care provider has access to [place] and Suicide Prevention discussed: Yes,  with pt and with pt's mother.  Have you used any form of tobacco in the last 30 days? (Cigarettes, Smokeless Tobacco, Cigars, and/or Pipes): No  Has patient been referred to the Quitline?: N/A patient is not a smoker  Patient has been referred for addiction treatment: [place], MSW, LCSWA 07/25/2016, 11:54 AM

## 2016-07-25 NOTE — BHH Suicide Risk Assessment (Signed)
[facility] Discharge Suicide Risk Assessment   Principal Problem: Bipolar I disorder, most recent episode depressed [facility]) Discharge Diagnoses:  Patient Active Problem List   Diagnosis Date Noted  . Low back pain [M54.5] 06/27/2016  . Left wrist injury, subsequent encounter [S69.92XD] 06/27/2016  . Pain of left thumb [M79.645] 10/07/2015  . 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 Strain of left thumb [IMO0002] 02/11/2015  . Strain of right forearm [S56.911A] 02/11/2015  . Right shoulder pain [M25.511] 12/31/2014  . Injury of right little finger [S69.91XA] 12/31/2014  . Episodic cluster headache, not intractable [G44.019] 12/02/2014  . 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 Hypersomnia, recurrent [G47.13] 12/02/2014  . 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 Lower back injury [S39.92XA] 09/30/2014  . Bipolar I disorder, most recent episode depressed ([place]) [F31.30]   . MDD (major depressive disorder), recurrent, severe, with psychosis ([place]) [F33.3] 09/23/2014  . Injury of left index finger [S69.92XA] 08/20/2014  . Right ankle sprain [S93.401A] 06/01/2014  . Contusion, multiple sites [T07.XXXA] 06/01/2014  . Strain of right gastrocnemius muscle [S86.111A] 06/01/2014  . Phonophobia [F40.298] 05/04/2014  . Photophobia of both eyes [H53.143] 05/04/2014  . 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 Nausea with vomiting [R11.2] 05/04/2014  . Mixed bipolar I disorder ([place]) [F31.60]   . Bipolar I disorder, most recent episode mixed ([place]) [F31.60] 04/18/2014  . Bipolar affective disorder, depressed, mild ([place]) [F31.31] 04/12/2014  . Suicidal ideation [R45.851] 04/12/2014  . Injury of right shoulder and upper arm [S49.91XA] 02/17/2014  . Left leg pain [M79.605] 06/03/2013  . Right hip pain [M25.551] 06/03/2013  . Migraine  with status migrainosus [G43.901] 01/08/2013  . Personality disorder [F60.9]   . Chronic migraine [G43.709] 05/08/2012  . Contact dermatitis [L25.9] 11/27/2011  . Major depressive disorder, recurrent episode ([place]) [F33.9] 10/27/2011  . Generalized anxiety disorder [F41.1] 10/27/2011  . 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 Borderline personality disorder [F60.3] 10/27/2011  . Right foot pain [M79.671] 09/28/2011  . 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 Ganglion cyst [M67.40] 09/29/2009  . 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 BIPOLAR DISORDER UNSPECIFIED [F31.9] 06/09/2008    Total Time spent with patient: 30 minutes  Musculoskeletal: Strength & Muscle Tone: within normal limits Gait & Station: normal Patient leans: N/A  Psychiatric Specialty Exam: ROS reports mild headache, no nausea, no vomiting, no fever, no rash   Blood pressure 98/75, pulse 72, temperature 98.6 F (37 C), temperature source Oral, resp. rate 16, height 5\' 9"  (1.753 m), weight 121.1 kg (267 lb), last menstrual period 07/08/2016, SpO2 100 %.Body mass index is 39.43 kg/m.  General Appearance: Well Groomed  Engineer, water::  Good  Speech:  Normal Rate409  Volume:  Normal  Mood:  partially improved mood, states she feels better than on admission  Affect:  less anxious, more reactive   Thought Process:  Linear and Descriptions of Associations: Intact  Orientation:  Full (Time, Place, and Person)  Thought Content:  denies hallucinations, no delusions, not internally preoccupied   Suicidal Thoughts:  No currently denies any suicidal or self injurious ideations, denies any homicidal or violent ideations  Homicidal Thoughts:  No  Memory:  recent and remote grossly intact   Judgement:  Other:  improving   Insight:  improving   Psychomotor Activity:  Normal  Concentration:  Good  Recall:  Good  Fund  of Knowledge:Good  Language: Good  Akathisia:  Negative  Handed:  Right  AIMS (if indicated):     Assets:  Communication Skills Desire for Improvement Resilience  Sleep:  Number of Hours: 6.25  Cognition: WNL  ADL's:  Intact   Mental Status Per Nursing Assessment::   On Admission:  Suicidal ideation indicated by patient  Demographic Factors:  46 year old single female, lives with mother, is employed   Loss Factors: Recent high profile suicides of celebrities, recent death of a friend's husband  Historical Factors: History of depression, anxiety, prior psychiatric admissions   Risk Reduction Factors:   Sense of responsibility to family, Employed, Living with another person, especially a relative and Positive coping skills or problem solving skills  Continued Clinical Symptoms:  At this time patient is alert and attentive, well related, well groomed, presents with improving mood and range of affect, not as anxious today, no thought disorder, no suicidal or self injurious ideations . No psychotic symptoms, future oriented. Plans to return home and to work soon . Denies medication side effects. Behavior on unit in good control, pleasant on approach .  Cognitive Features That Contribute To Risk:  No gross cognitive deficits noted upon discharge. Is alert , attentive, and oriented x 3   Suicide Risk:  Mild:  Suicidal ideation of limited frequency, intensity, duration, and specificity.  There are no identifiable plans, no associated intent, mild dysphoria and related symptoms, good self-control (both objective and subjective assessment), few other risk factors, and identifiable protective factors, including available and accessible social support.  Follow-up [facility] Follow up on 08/07/2016.   Why:  at 12:30pm for medication management with Pauline Good, NP Contact information: [address] [place], [place] [address] Phone: [phone] Fax: [phone]       [facility] Follow up on 07/27/2016.   Why:  Therapy appointment with Ruben Reason at [facility] information: [place],  [place], [place] [address] Phone: [facility] Follow up on 07/26/2016.   Specialty:  Behavioral Health Why:  Intensive Outpatient assessment appointment @8 :45am. Contact information: [place] [phone]          Plan Of Care/Follow-up recommendations:  Activity:  as tolerated  Diet:  regular  Tests:  NA Other:  see below Patient is leaving unit in good spirits  Plans to return to live with her mother, and plans to return to work soon Plans to follow up as above  Jenne [place], MD 07/25/2016, 12:53 PM

## 2016-07-25 NOTE — Discharge Summary (Signed)
Physician Discharge Summary Note  Patient:  [name] is an 46 y.o., female MRN:  [id number] DOB:  1970-11-30 Patient phone:  [phone] (home)  Patient address:   [place] [address],  Total Time spent with patient: 45 minutes  Date of Admission:  07/17/2016 Date of Discharge: 07/25/2016  Reason for Admission:   46 year old female, who is known to our unit from prior psychiatric admission in 2016. She has a long history of mood disorder. States she has been more depressed recently, and has developed suicidal ideations , including thoughts of stabbing self or driving into a tree. She states recent suicides of celebrities that have been covered by the media has been a contributor. States " if they had all that they have and still killed themselves, what about me ".  Also states that one of her friend's husband committed suicide 2 months ago.  Principal Problem: Bipolar I disorder, most recent episode depressed [facility]) Discharge Diagnoses: Patient Active Problem List   Diagnosis Date Noted  . Bipolar I disorder, most recent episode depressed ([place]) [F31.30]     Priority: High  . Low back pain [M54.5] 06/27/2016  . Left wrist injury, subsequent encounter [S69.92XD] 06/27/2016  . Pain of left thumb [M79.645] 10/07/2015  . 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 Strain of left thumb [IMO0002] 02/11/2015  . Strain of right forearm [S56.911A] 02/11/2015  . Right shoulder pain [M25.511] 12/31/2014  . Injury of right little finger [S69.91XA] 12/31/2014  . Episodic cluster headache, not intractable [G44.019] 12/02/2014  . 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 Hypersomnia, recurrent [G47.13] 12/02/2014  . 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 Lower back injury [S39.92XA] 09/30/2014  . MDD (major depressive disorder),  recurrent, severe, with psychosis ([place]) [F33.3] 09/23/2014  . Injury of left index finger [S69.92XA] 08/20/2014  . Right ankle sprain [S93.401A] 06/01/2014  . Contusion, multiple sites [T07.XXXA] 06/01/2014  . Strain of right gastrocnemius muscle [S86.111A] 06/01/2014  . Phonophobia [F40.298] 05/04/2014  . Photophobia of both eyes [H53.143] 05/04/2014  . 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 Nausea with vomiting [R11.2] 05/04/2014  . Mixed bipolar I disorder ([place]) [F31.60]   . Bipolar I disorder, most recent episode mixed ([place]) [F31.60] 04/18/2014  . Bipolar affective disorder, depressed, mild ([place]) [F31.31] 04/12/2014  . Suicidal ideation [R45.851] 04/12/2014  . Injury of right shoulder and upper arm [S49.91XA] 02/17/2014  . Left leg pain [M79.605] 06/03/2013  . Right hip pain [M25.551] 06/03/2013  . Migraine with status migrainosus [G43.901] 01/08/2013  . Personality disorder [F60.9]   . Chronic migraine [G43.709] 05/08/2012  . Contact dermatitis [L25.9] 11/27/2011  . Major depressive disorder, recurrent episode ([place]) [F33.9] 10/27/2011  . Generalized anxiety disorder [F41.1] 10/27/2011  . 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 Borderline personality disorder [F60.3] 10/27/2011  . Right foot pain [M79.671] 09/28/2011  . 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 Ganglion cyst [M67.40] 09/29/2009  . 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 BIPOLAR DISORDER UNSPECIFIED [F31.9] 06/09/2008    Past Psychiatric History: see H&P  Past Medical History:  Past Medical History:  Diagnosis Date  . Achilles tendinitis   . Achilles tendinitis   . ADHD (attention deficit hyperactivity disorder)   . Allergy   . Arthritis   . Bipolar affective (  [name])   . Bipolar disorder ([place])   . Cataracts, bilateral   . Eczema   . Ganglion cyst 09/29/2009    left wrist (2 cyst)  . Hyperprolactinemia ([place])   . Hypertension   . Lipoma   . Migraine   . Personality disorder   . Pes planus     Past Surgical History:  Procedure Laterality Date  . ANKLE SURGERY  12/88   left   . chest nodule  1990?   rt chest wall nodule removal  . GANGLION CYST EXCISION  2011  . lipoma removal    . right bunioectomy    . SHOULDER SURGERY  01/13/2011   right, partial tear  . tumor resection left thigh     Family History:  Family History  Problem Relation Age of Onset  . Hypertension Mother   . Hyperlipidemia Mother   . Heart attack Father   . Heart disease Father   . Hypertension Father   . Bipolar disorder Father   . Diabetes Paternal Grandfather   . Heart disease Maternal Aunt   . Breast cancer Maternal Aunt   . Heart disease Maternal Grandmother   . Cancer Maternal Grandmother        colon   Family Psychiatric  History: see H&P Social History:  History  Alcohol Use No     History  Drug Use No    Social History   Social History  . Marital status: Single    Spouse name: N/A  . Number of children: 0  . Years of education: N/A   Occupational History  .  [employer]   Social History Main Topics  . Smoking status: Never Smoker  . Smokeless tobacco: Never Used  . Alcohol use No  . Drug use: No  . Sexual activity: No   Other Topics Concern  . None   Social History Narrative   Caffeine  2 sodas daily, 1 cup coffee daily.    Hospital Course:   [name] was admitted for Bipolar I disorder, most recent episode depressed ([place]) , and crisis management.  Pt was treated discharged with the medications listed below under Medication List.  Medical problems were identified and treated as needed.  Home medications were restarted as appropriate.  Improvement was monitored by observation and [name] 's daily report of symptom reduction.  Emotional and mental status was monitored by daily self-inventory reports completed by [name] and clinical staff.         [name] was evaluated by the treatment team for stability and plans for continued recovery upon discharge. Joyce Oswald Musolf 's motivation was an integral factor for scheduling further treatment. Employment, transportation, bed availability, health status, family support, and any pending legal issues were also considered during hospital stay. Pt was offered further treatment options upon discharge including but not limited to Residential, Intensive Outpatient, and Outpatient treatment.  Luretha Eberly Winnick will follow up with the services as listed below under Follow Up Information.     Upon completion of this admission the patient was both mentally and medically stable for discharge denying suicidal/homicidal ideation, auditory/visual/tactile hallucinations, delusional thoughts and paranoia.    Sallye Lunz Talarico responded well to treatment with depakote, vistaril, lamictal, latuda, toprol, zoloft without adverse effects. Pt demonstrated improvement without reported or observed adverse effects to the point of stability appropriate for outpatient management. Pertinent labs include: prolactin 52.8H and Valproic acid 101H  for which outpatient follow-up is  necessary for lab recheck as mentioned below. Reviewed CBC, CMP, BAL, and UDS; all unremarkable aside from noted exceptions.   Physical Findings: AIMS: Facial and Oral Movements Muscles of Facial Expression: None, normal Lips and Perioral Area: None, normal Jaw: None, normal Tongue: None, normal,Extremity Movements Upper (arms, wrists, hands, fingers): None, normal Lower (legs, knees, ankles, toes): None, normal, Trunk Movements Neck, shoulders, hips: None, normal, Overall Severity Severity of abnormal movements (highest score from questions above): None, normal Incapacitation due to abnormal movements: None, normal Patient's awareness of abnormal movements (rate only patient's report): No Awareness, Dental  Status Current problems with teeth and/or dentures?: No Does patient usually wear dentures?: No  CIWA:  CIWA-Ar Total: 1 COWS:  COWS Total Score: 1  Musculoskeletal: Strength & Muscle Tone: within normal limits Gait & Station: normal Patient leans: N/A  Psychiatric Specialty Exam: Physical Exam  Review of Systems  Psychiatric/Behavioral: Positive for depression. Negative for hallucinations and suicidal ideas. The patient is nervous/anxious and has insomnia.   All other systems reviewed and are negative.   Blood pressure 98/75, pulse 72, temperature 98.6 F (37 C), temperature source Oral, resp. rate 16, height 5\' 9"  (1.753 m), weight 121.1 kg (267 lb), last menstrual period 07/08/2016, SpO2 100 %.Body mass index is 39.43 kg/m.  SEE MD PSE within SRA  Have you used any form of tobacco in the last 30 days? (Cigarettes, Smokeless Tobacco, Cigars, and/or Pipes): No  Has this patient used any form of tobacco in the last 30 days? (Cigarettes, Smokeless Tobacco, Cigars, and/or Pipes) No  Blood Alcohol level:  Lab Results  Component Value Date   ETH <5 07/15/2016   ETH <5 24/40/1027    Metabolic Disorder Labs:  Lab Results  Component Value Date   HGBA1C 5.1 07/19/2016   MPG 100 07/19/2016   MPG 108 09/25/2014   Lab Results  Component Value Date   PROLACTIN 52.8 (H) 07/19/2016   PROLACTIN  08/21/2009    14.3 (NOTE)     Reference Ranges:                 Female:                       2.1 -  17.1 ng/ml                 Female:   Pregnant          9.7 - 208.5 ng/mL                           Non Pregnant      2.8 -  29.2 ng/mL                           Post  Menopausal   1.8 -  20.3 ng/mL                     Lab Results  Component Value Date   CHOL 178 07/19/2016   TRIG 132 07/19/2016   HDL 52 07/19/2016   CHOLHDL 3.4 07/19/2016   VLDL 26 07/19/2016   LDLCALC 100 (H) 07/19/2016   LDLCALC 96 09/25/2014    See Psychiatric Specialty Exam and Suicide Risk Assessment completed by  Attending Physician prior to discharge.  Discharge destination:  Home  Is patient on multiple antipsychotic therapies at discharge:  No   Has Patient had three  or more failed trials of antipsychotic monotherapy by history:  No  Recommended Plan for Multiple Antipsychotic Therapies: NA   Allergies as of 07/25/2016      Reactions   Adhesive [tape] Itching, Rash   Also reacted to Steri Strips and Band-Aids.   Dilaudid [hydromorphone Hcl] Itching   Morphine Nausea And Vomiting   Penicillins Hives   Has patient had a PCN reaction causing immediate rash, facial/tongue/throat swelling, SOB or lightheadedness with hypotension: YES Has patient had a PCN reaction causing severe rash involving mucus membranes or skin necrosis: NO Has patient had a PCN reaction that required hospitalization NO Has patient had a PCN reaction occurring within the last 10 years:NO If all of the above answers are "NO", then may proceed with Cephalosporin use.   Percocet [oxycodone-acetaminophen] Itching   Prednisone Hives   Provera [medroxyprogesterone Acetate] Other (See Comments)   Causes manic episodes   Ultram [tramadol Hcl] Itching      Medication List    STOP taking these medications   azelastine 0.1 % nasal spray Commonly known as:  ASTELIN   hydrocortisone cream 0.5 %   hydrOXYzine 50 MG capsule Commonly known as:  VISTARIL   meloxicam 15 MG tablet Commonly known as:  MOBIC   metaxalone 800 MG tablet Commonly known as:  SKELAXIN   multivitamin with minerals Tabs tablet   ondansetron 4 MG tablet Commonly known as:  ZOFRAN   promethazine 25 MG tablet Commonly known as:  PHENERGAN   REFRESH OP   rOPINIRole 2 MG tablet Commonly known as:  REQUIP   sodium chloride 0.9 % SOLN 100 mL with valproate 500 MG/5ML SOLN 500 mg   SUMAtriptan 6 MG/0.5ML Soaj   zolpidem 10 MG tablet Commonly known as:  AMBIEN     TAKE these medications     Indication  divalproex 500 MG 24 hr  tablet Commonly known as:  DEPAKOTE ER Take 3 tablets (1,500 mg total) by mouth at bedtime. What changed:  See the new instructions.  Indication:  Mixed Bipolar Affective Disorder   hydrOXYzine 50 MG tablet Commonly known as:  ATARAX/VISTARIL Take 1 tablet (50 mg total) by mouth 3 (three) times daily as needed for anxiety.  Indication:  Anxiety Neurosis   lamoTRIgine 200 MG tablet Commonly known as:  LAMICTAL Take 1 tablet (200 mg total) by mouth at bedtime. What changed:  additional instructions  Indication:  Manic-Depression   lurasidone 80 MG Tabs tablet Commonly known as:  LATUDA Take 1 tablet (80 mg total) by mouth daily with supper. What changed:  medication strength  how much to take  when to take this  Indication:  mood stabilization   metoprolol succinate 100 MG 24 hr tablet Commonly known as:  TOPROL-XL Take 1 tablet (100 mg total) by mouth daily. Take with or immediately following a meal. Start taking on:  07/26/2016 What changed:  additional instructions  Indication:  High Blood Pressure Disorder   sertraline 50 MG tablet Commonly known as:  ZOLOFT Take 1 tablet (50 mg total) by mouth daily. Start taking on:  07/26/2016  Indication:  Major Depressive Disorder   traZODone 50 MG tablet Commonly known as:  DESYREL Take 1 tablet (50 mg total) by mouth at bedtime as needed for sleep.  Indication:  [facility] Follow up on 08/07/2016.   Why:  at 12:30pm for medication management with Pauline Good, NP Contact information: [phone]  [address] [place], [place], [place] [address] Phone: [phone] Fax: [phone]       [facility] Follow up on 07/27/2016.   Why:  Therapy appointment with Ruben Reason at [facility] information: [address],  [place], [place] [address] Phone: [facility] Follow up on 07/26/2016.    Specialty:  Behavioral Health Why:  Intensive Outpatient assessment appointment @8 :45am. Contact information: [place] [phone]          Follow-up recommendations:  Activity:  As tolerated Diet:  Heart healthy with low sodium  Comments:   Take all medications as prescribed. Keep all follow-up appointments as scheduled.  Do not consume alcohol or use illegal drugs while on prescription medications. Report any adverse effects from your medications to your primary care provider promptly.  In the event of recurrent symptoms or worsening symptoms, call 911, a crisis hotline, or go to the nearest emergency department for evaluation.   Signed: Benjamine Mola, FNP 07/25/2016, 10:59 AM   Patient seen, Suicide Assessment Completed.  Disposition Plan Reviewed

## 2016-07-25 NOTE — Progress Notes (Signed)
Discharge note:  Patient discharged home per MD order.  Patient will follow up with [facility] upon discharge.  Reviewed AVS/transition record with patient and she indicated understanding.  Patient denies any thoughts of self harm or harm toward others. Patient rates her depression as a 3; hopelessness as a 2; anxiety as a 5. She is sleeping and eating well; her energy is normal; her concentration is fair.  Her goal is to "prepare to discharge today."  She does not appear to be responding to internal stimuli.  Patient received all personal belongings from unit and locker.  Patient left ambulatory with her mother.

## 2016-07-26 ENCOUNTER — Other Ambulatory Visit (HOSPITAL_COMMUNITY): Payer: PPO | Attending: Psychiatry | Admitting: Psychiatry

## 2016-07-26 ENCOUNTER — Encounter (HOSPITAL_COMMUNITY): Payer: Self-pay | Admitting: Psychiatry

## 2016-07-26 DIAGNOSIS — F603 Borderline personality disorder: Secondary | ICD-10-CM | POA: Diagnosis not present

## 2016-07-26 DIAGNOSIS — F3175 Bipolar disorder, in partial remission, most recent episode depressed: Secondary | ICD-10-CM | POA: Diagnosis not present
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
## 2016-07-27 ENCOUNTER — Other Ambulatory Visit (HOSPITAL_BASED_OUTPATIENT_CLINIC_OR_DEPARTMENT_OTHER): Payer: PPO | Admitting: Psychiatry

## 2016-07-27 DIAGNOSIS — F333 Major depressive disorder, recurrent, severe with psychotic symptoms: Secondary | ICD-10-CM

## 2016-07-27 DIAGNOSIS — F3175 Bipolar disorder, in partial remission, most recent episode depressed: Secondary | ICD-10-CM

## 2016-07-27 DIAGNOSIS — F603 Borderline personality disorder: Secondary | ICD-10-CM

## 2016-07-27 NOTE — Progress Notes (Signed)
Psychiatric Initial Adult Assessment   Patient Identification: [name] MRN:  [id number] Date of Evaluation:  07/27/2016 Referral Source: iop asssessment Chief Complaint:  depression, bipolar, borderline personality Visit Diagnosis:    ICD-10-CM   1. Borderline personality disorder F60.3   2. Bipolar disorder, in partial remission, most recent episode depressed ([place]) F31.75   3. MDD (major depressive disorder), recurrent, severe, with psychosis ([place]) F33.3     History of Present Illness:  [name] is a 46 year old female with a psychiatric history of depression, bipolar disorder, with recent psychiatric admission with suicidal thoughts with a plan to drive herself into a tree or stab herself. She was admitted to the mental health unit from June 18 until June 26 (2 days ago).  Presents today for participation in IOP.  She has outpatient psychiatric medication management with Pauline Good at the [facility].    Patient reports that she feels very comfortable with her current psychiatric medication regimen. We discussed some of the stressors that led to her presentation to the hospital, and what she hopes to learn from the IOP. Into an use to present with some passive suicidal thoughts, but denies any plan to harm herself. She agrees to let us know if she was to feel acutely unsafe.    She denies any current medical concerns, and feels comfortable with her current medications. She continues to have significant anxiety when she sees triggers or reminders of suicide, such as the recent events that of been on the news, related to celebrity suicide. Spent time with patient empathizing and validating this triggering nature, and encouraged her to participate in IOP as she learns coping strategies.  Associated Signs/Symptoms: Depression Symptoms:  depressed mood, anhedonia, insomnia, fatigue, feelings of worthlessness/guilt, difficulty  concentrating, hopelessness, recurrent thoughts of death, (Hypo) Manic Symptoms:  none Anxiety Symptoms:  Excessive Worry, Social Anxiety, Psychotic Symptoms:  none PTSD Symptoms: Negative  Past Psychiatric History: Multiple psychiatric hospitalizations in 2016 in 2018. Past diagnoses of borderline personality disorder and bipolar disorder. She has multiple suicide attempts by walking into traffic.  Previous Psychotropic Medications: Yes   Substance Abuse History in the last 12 months:  No.  Consequences of Substance Abuse: Negative  Past Medical History:  Past Medical History:  Diagnosis Date  . Achilles tendinitis   . Achilles tendinitis   . ADHD (attention deficit hyperactivity disorder)   . Allergy   . Arthritis   . Bipolar affective ([place])   . Bipolar disorder ([place])   . Cataracts, bilateral   . Eczema   . Ganglion cyst 09/29/2009   left wrist (2 cyst)  . Hyperprolactinemia ([place])   . Hypertension   . Lipoma   . Migraine   . Personality disorder   . Pes planus     Past Surgical History:  Procedure Laterality Date  . ANKLE SURGERY  12/88   left   . chest nodule  1990?   rt chest wall nodule removal  . GANGLION CYST EXCISION  2011  . lipoma removal    . right bunioectomy    . SHOULDER SURGERY  01/13/2011   right, partial tear  . tumor resection left thigh      Family Psychiatric History: reviewed as below  Family History:  Family History  Problem Relation Age of Onset  . Hypertension Mother   . Hyperlipidemia Mother   . Heart attack Father   . Heart disease Father   . Hypertension Father   . Bipolar  disorder Father   . Diabetes Paternal Grandfather   . Heart disease Maternal Aunt   . Breast cancer Maternal Aunt   . Heart disease Maternal Grandmother   . Cancer Maternal Grandmother        colon    Social History:   Social History   Social History  . Marital status: Single    Spouse name: N/A  . Number of children: 0  . Years of education:  N/A   Occupational History  .  [employer]   Social History Main Topics  . Smoking status: Never Smoker  . Smokeless tobacco: Never Used  . Alcohol use No  . Drug use: No  . Sexual activity: No   Other Topics Concern  . Not on file   Social History Narrative   Caffeine  2 sodas daily, 1 cup coffee daily.    Additional Social History: reviewed as above  Allergies:   Allergies  Allergen Reactions  . Adhesive [Tape] Itching and Rash    Also reacted to Steri Strips and Band-Aids.  . Dilaudid [Hydromorphone Hcl] Itching  . Morphine Nausea And Vomiting  . Penicillins Hives    Has patient had a PCN reaction causing immediate rash, facial/tongue/throat swelling, SOB or lightheadedness with hypotension: YES Has patient had a PCN reaction causing severe rash involving mucus membranes or skin necrosis: NO Has patient had a PCN reaction that required hospitalization NO Has patient had a PCN reaction occurring within the last 10 years:NO If all of the above answers are "NO", then may proceed with Cephalosporin use.  Marland Kitchen Percocet [Oxycodone-Acetaminophen] Itching  . Prednisone Hives  . Provera [Medroxyprogesterone Acetate] Other (See Comments)    Causes manic episodes  . Ultram [Tramadol Hcl] Itching    Metabolic Disorder Labs: Lab Results  Component Value Date   HGBA1C 5.1 07/19/2016   MPG 100 07/19/2016   MPG 108 09/25/2014   Lab Results  Component Value Date   PROLACTIN 52.8 (H) 07/19/2016   PROLACTIN  08/21/2009    14.3 (NOTE)     Reference Ranges:                 Female:                       2.1 -  17.1 ng/ml                 Female:   Pregnant          9.7 - 208.5 ng/mL                           Non Pregnant      2.8 -  29.2 ng/mL                           Post  Menopausal   1.8 -  20.3 ng/mL                     Lab Results  Component Value Date   CHOL 178 07/19/2016   TRIG 132 07/19/2016   HDL 52 07/19/2016   CHOLHDL 3.4 07/19/2016   VLDL 26 07/19/2016   LDLCALC 100 (H)  07/19/2016   LDLCALC 96 09/25/2014     Current Medications: Current Outpatient Prescriptions  Medication Sig Dispense Refill  . divalproex (DEPAKOTE ER) 500 MG 24 hr tablet Take 3 tablets (1,500 mg total) by  mouth at bedtime. 90 tablet 0  . hydrOXYzine (ATARAX/VISTARIL) 50 MG tablet Take 1 tablet (50 mg total) by mouth 3 (three) times daily as needed for anxiety. 60 tablet 0  . lamoTRIgine (LAMICTAL) 200 MG tablet Take 1 tablet (200 mg total) by mouth at bedtime. 30 tablet 0  . lurasidone (LATUDA) 80 MG TABS tablet Take 1 tablet (80 mg total) by mouth daily with supper. 30 tablet 0  . metoprolol succinate (TOPROL-XL) 100 MG 24 hr tablet Take 1 tablet (100 mg total) by mouth daily. Take with or immediately following a meal. 30 tablet 0  . sertraline (ZOLOFT) 50 MG tablet Take 1 tablet (50 mg total) by mouth daily. 30 tablet 0  . traZODone (DESYREL) 50 MG tablet Take 1 tablet (50 mg total) by mouth at bedtime as needed for sleep. 30 tablet 0   No current facility-administered medications for this visit.     Neurologic: Headache: Negative Seizure: Negative Paresthesias:Negative  Musculoskeletal: Strength & Muscle Tone: within normal limits Gait & Station: normal Patient leans: N/A  Psychiatric Specialty Exam: ROS  Last menstrual period 07/08/2016.There is no height or weight on file to calculate BMI.  General Appearance: Casual and Fairly Groomed  Eye Contact:  Good  Speech:  Clear and Coherent  Volume:  Normal  Mood:  Anxious and Depressed  Affect:  Congruent, Constricted and Depressed  Thought Process:  Coherent and Goal Directed  Orientation:  Full (Time, Place, and Person)  Thought Content:  Logical  Suicidal Thoughts:  Yes.  without intent/plan  Homicidal Thoughts:  No  Memory:  Immediate;   Fair  Judgement:  Intact  Insight:  Shallow  Psychomotor Activity:  Normal  Concentration:  Concentration: Good  Recall:  Good  Fund of Knowledge:Good  Language: Good   Akathisia:  Negative  Handed:  Right  AIMS (if indicated):  0  Assets:  Communication Skills Desire for Improvement  ADL's:  Intact  Cognition: WNL  Sleep:  6-7 hours    Treatment Plan Summary: 1. Borderline personality disorder   2. Bipolar disorder, in partial remission, most recent episode depressed ([place])   3. MDD (major depressive disorder), recurrent, severe, with psychosis ([place])     Sevannah Madia Kadrmas is a 46 year old female with a history of bipolar disorder, borderline personality, trauma related anxiety who presents for IOP assessment. Given her history of multiple psychiatric hospitalization, intermittent participation in psychiatric care, and prior suicide attempts, I believe that she is an appropriate candidate for participation in the IOP as a stepdown from her hospitalization. She has recently had some medication modifications in hospital, which she is tolerating well. I'm hopeful that she will fully participate in the IOP to garner the necessary coping strategies and skills for continued improvement in her condition. She can resume her outpatient psychiatric medication management with her nurse practitioner at [facility] while participating in the IOP.  Aundra Dubin, MD 6/28/201812:22 PM

## 2016-07-28 ENCOUNTER — Other Ambulatory Visit (HOSPITAL_COMMUNITY): Payer: PPO | Admitting: Psychiatry

## 2016-07-28 DIAGNOSIS — F603 Borderline personality disorder: Secondary | ICD-10-CM

## 2016-07-28 DIAGNOSIS — F3175 Bipolar disorder, in partial remission, most recent episode depressed: Secondary | ICD-10-CM

## 2016-07-29 NOTE — Progress Notes (Signed)
    Daily Group Progress Note  Program: IOP  Group Time: 9:00-12:00  Participation Level: Active  Behavioral Response: Appropriate  Type of Therapy:  Group Therapy  Summary of Progress: Pt. 's first day of group and met with the case manager and the psychiatrist. Pt.'s anger was triggered by other patient's sadness regarding recent current news events. Pt. Took the opportunity to discuss her political views and invalidate the other Pt. Pt. Requested to speak with counselor after group and requested feedback regarding her participation in group. Pt. Was told that her political views were welcome as long as she could remain civil and she was encouraged to see opportunities to empathize with others who may have different political views than her own. Pt. Participated in discussion about local community mental health support services facilitated by Cristie Hem from the mental health association.     Nancie Neas, LPC

## 2016-07-29 NOTE — Progress Notes (Signed)
    Daily Group Progress Note  Program: IOP  Group Time: 9:00-12:00  Participation Level: Active  Behavioral Response: Appropriate  Type of Therapy:  Group Therapy  Summary of Progress: Pt. Presents as talkative, engaged in the group process. Pt. Discussed that a few days ago she went into a grocery store and her suicidal ideation was triggered by magazine covers that featured recent celebrity suicides. Pt. Stated that she was able to say to herself "that is their stuff, not mine" and choose to change her focus to making good choices in her life. Pt. Discussed that yesterday she exercised self-care by visiting with a friend and giving support to her. Pt. Participated in conversation about the need for validation in different areas of our lives including career, relationship and the processing of accepting that the validation we desire.         Nancie Neas, LPC

## 2016-07-29 NOTE — Progress Notes (Signed)
    Daily Group Progress Note  Program: IOP  Group Time: 9:00-12:00  Participation Level: Active  Behavioral Response: Appropriate  Type of Therapy:  Group Therapy  Summary of Progress: Pt met with the psychiatrist and the case manager. Pt. Reported that she felt hopeful about her recovery since her discharge from inpatient. Pt. Discussed that she was on a new antidepressant and she felt that it was working well for her. Pt. Participated in discussion about how to recognize where your personal boundaries are and when they have been crossed, and recognizing your personal "NO".      Nancie Neas, LPC

## 2016-07-31 ENCOUNTER — Other Ambulatory Visit (HOSPITAL_COMMUNITY): Payer: PPO | Attending: Psychiatry | Admitting: Psychiatry

## 2016-07-31 DIAGNOSIS — F3175 Bipolar disorder, in partial remission, most recent episode depressed: Secondary | ICD-10-CM | POA: Diagnosis not present

## 2016-07-31 DIAGNOSIS — F419 Anxiety disorder, unspecified: Secondary | ICD-10-CM | POA: Diagnosis not present

## 2016-07-31 DIAGNOSIS — F603 Borderline personality disorder: Secondary | ICD-10-CM | POA: Diagnosis not present

## 2016-07-31 NOTE — Progress Notes (Signed)
    Daily Group Progress Note  Program: IOP  Group Time: 9:00-12:00  Participation Level: Active  Behavioral Response: Appropriate  Type of Therapy:  Group Therapy  Summary of Progress: Pt. Presents as talkative, engaged in the group process, bright affect. Pt. Participated in grief and loss group with the Chaplain. Pt. Shared that she had some anxiety about going back to work on Friday. Pt. Received feedback from the group about not aspiring for perfection on her first day and not becoming angry when other's don't met her standards. Pt. Participated in discussion of applying compassion to self and others in work situations.     Nancie Neas, LPC

## 2016-08-01 ENCOUNTER — Other Ambulatory Visit (HOSPITAL_COMMUNITY): Payer: PPO | Admitting: Psychiatry

## 2016-08-01 DIAGNOSIS — F3175 Bipolar disorder, in partial remission, most recent episode depressed: Secondary | ICD-10-CM

## 2016-08-01 DIAGNOSIS — F603 Borderline personality disorder: Secondary | ICD-10-CM

## 2016-08-03 ENCOUNTER — Other Ambulatory Visit (HOSPITAL_COMMUNITY): Payer: PPO

## 2016-08-03 NOTE — Progress Notes (Signed)
    Daily Group Progress Note  Program: IOP  Group Time: 9:00-12:00  Participation Level: Active  Behavioral Response: Appropriate  Type of Therapy:  Group Therapy  Summary of Progress: Pt. Presents with brightened affect, talkative, engaged in the group process. Pt. Is receiving feedback well regarding sitting with the pain of others, listening, and demonstrating empathy. Pt. Discussed ongoing work stress and problems with communication with her mother. Pt. Participated in discussion about the use of self-compassion to get through work and relationship challenges.      Nancie Neas, LPC

## 2016-08-04 ENCOUNTER — Other Ambulatory Visit (HOSPITAL_COMMUNITY): Payer: PPO | Admitting: Psychiatry

## 2016-08-04 DIAGNOSIS — F419 Anxiety disorder, unspecified: Secondary | ICD-10-CM | POA: Diagnosis not present

## 2016-08-04 DIAGNOSIS — F3175 Bipolar disorder, in partial remission, most recent episode depressed: Secondary | ICD-10-CM

## 2016-08-04 DIAGNOSIS — F603 Borderline personality disorder: Secondary | ICD-10-CM

## 2016-08-04 NOTE — Progress Notes (Signed)
    Daily Group Progress Note  Program: IOP  Group Time: 9:00-12:00  Participation Level: Active  Behavioral Response: Appropriate  Type of Therapy:  Group Therapy  Summary of Progress: Pt. Presents as talkative, engaged in the group process. Pt. Shared that her mother made a mean comment about her weight and the way her clothes were fitting. Pt. Requested feedback from the group about how to respond to her mother. Pt. Participated in discussion about developing healthy boundaries with family members.     Nancie Neas, LPC

## 2016-08-07 ENCOUNTER — Other Ambulatory Visit (HOSPITAL_COMMUNITY): Payer: PPO | Admitting: Psychiatry

## 2016-08-07 DIAGNOSIS — F603 Borderline personality disorder: Secondary | ICD-10-CM

## 2016-08-07 DIAGNOSIS — F3175 Bipolar disorder, in partial remission, most recent episode depressed: Secondary | ICD-10-CM | POA: Diagnosis not present

## 2016-08-08 ENCOUNTER — Other Ambulatory Visit (HOSPITAL_COMMUNITY): Payer: PPO | Admitting: Psychiatry

## 2016-08-08 DIAGNOSIS — F3175 Bipolar disorder, in partial remission, most recent episode depressed: Secondary | ICD-10-CM

## 2016-08-08 DIAGNOSIS — F603 Borderline personality disorder: Secondary | ICD-10-CM

## 2016-08-09 ENCOUNTER — Other Ambulatory Visit (HOSPITAL_COMMUNITY): Payer: PPO | Admitting: Psychiatry

## 2016-08-09 DIAGNOSIS — F3175 Bipolar disorder, in partial remission, most recent episode depressed: Secondary | ICD-10-CM | POA: Diagnosis not present

## 2016-08-09 DIAGNOSIS — F603 Borderline personality disorder: Secondary | ICD-10-CM

## 2016-08-10 ENCOUNTER — Other Ambulatory Visit (HOSPITAL_COMMUNITY): Payer: PPO | Admitting: Psychiatry

## 2016-08-10 DIAGNOSIS — F3175 Bipolar disorder, in partial remission, most recent episode depressed: Secondary | ICD-10-CM | POA: Diagnosis not present

## 2016-08-10 DIAGNOSIS — F603 Borderline personality disorder: Secondary | ICD-10-CM

## 2016-08-10 NOTE — Progress Notes (Signed)
    Daily Group Progress Note  Program: IOP  Group Time: 9:00-12:00  Participation Level: Active  Behavioral Response: Appropriate  Type of Therapy:  Group Therapy  Summary of Progress: Pt. Participated in medication management group with the pharmacist. Pt. Shared that she worked very hard over the weekend and was very tired. Pt. Reported to the group that she was able to articulate personal boundaries to her mother about the mean things that her mother says to her and that her mother received this positively. Pt. Continues to worry about suicidal ideation and was able to discuss her thoughts with the group. Pt. Reported that her thoughts are frequent, but passive and she has no plan or immediacy to her thoughts.    Nancie Neas, LPC

## 2016-08-10 NOTE — Progress Notes (Signed)
    Daily Group Progress Note  Program: IOP  Group Time: 9:00-12:00  Participation Level: Active  Behavioral Response: Appropriate  Type of Therapy:  Group Therapy  Summary of Progress:  Pt. Prepared for discharge and met with the case manager and the psychiatrist. Pt. Expressed her anxiety about leaving the group, but discussed her plan to connect with the mental health association, requesting accomodations from her workplace, and continuing success in setting healthy boundaries with her mother, and plans for travel in the future. Pt. Participated in presentations on grief and loss with the Chaplain and presentation by mental health association.     Nancie Neas, LPC

## 2016-08-10 NOTE — Progress Notes (Signed)
[name] is a 46 y.o., single, employed, Caucasian female; who was transitioned from the inpatient unit at [facility].  Pt was admitted from 07-17-16 thru 07-25-16 due to Bipolar Disorder; depressed episode, with SI (plan was to stab self or drive car into a tree.  Pt is well known to this Probation officer; due to a long psychiatric history.  Pt continues to deny any current SI.  Denies HI or A/V hallucinations.  Stressors include:  1)  Grief/Loss Issues:  Recent celebrity suicides that's been covered by the media.  "I felt that if they can do it, I should be able to also."  Also, ~ three months ago, a close friend's husband committed suicide.  Apparently, he had been struggling with depression for awhile, but wouldn't go seek help.  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  According to pt, there has been an increase with stress due to having many responsibilities.  "I have asked that they lower my hours.  They keep messing with my schedule".  3)  Conflictual relationship with mother.  Pt resides with mother.  States she "nags" a lot.  Pt has had over twenty inpt psychiatric admissions; with three prior suicide attempts (2002, 2005, 2009).  Pt was a patient of Dr. Iona Coach for yrs.  Recently was with Dr. Caprice Beaver before she closed her practice.  Currently with Pauline Good, NP and Ruben Reason, LPC.  Family Hx:  Father (Bipolar D/O)                                           Pt completed MH-IOP today.  States she is anxious about returning to work today.  Reports that they still have her down for working long hours.  "I am anxious about today because this is my first day of six days scheduled."  Pt also voiced feeling very anxious about her therapist going on vacation for two weeks.  A:  Provided pt with support and feedback.  Pointed out patient's strengths.  Encouraged her to attend support groups through [facility] or attend DBT groups while therapist is on vacation, for the added structure and support.  F/U with Ruben Reason, LPC on 08-11-16  and Pauline Good, PA-C on 09-06-16.  R:  Pt receptive.     Carlis Abbott, RITA, M.Ed, CNA

## 2016-08-10 NOTE — Progress Notes (Signed)
[facility] IOP DISCHARGE NOTE  Patient:  Tisha Cline Wagenaar DOB:  03-Jun-1970  Date of Admission:07/27/2016  Date of Discharge: 08/10/2016  Reason for Admission:post inpatient stay for depression  IOP Course:attended and participated.  She said the group was very helpful.  She became overly dependent in some ways on the group and was very anxious about discharge thinking she needed more time.  From my perspective she seems to have language processing issues in that she does not integrate information nor think of consequences of what she says or does or use past experience to help her balance her moods and actions  Mental Status at Discharge:no specific suicidal thoughts or threats but vague thoughts about not being able to manage without the daily group  Diagnosis:bipolar 1 disorder depressed, moderate.  History of diagnosis of borderline personality disorder, anxiety disorder, major depressive disorder.  Rule out central auditory processing disorder   Level of Care:  IOP  Discharge destination:      Comments:  Has the same issues in spite of repeated therapies  Either the processing issues or personality disorder issues are likely the sticking point in getting better or not  The patient received suicide prevention pamphlet:  Yes   Donnelly Angelica, MD Patient ID: Lyllian Gause Bouska, female   DOB: 03/25/1970, 46 y.o.   MRN: [id number]

## 2016-08-10 NOTE — Patient Instructions (Signed)
D:  Patient successfully completed MH-IOP today.  A:  Follow up with Ruben Reason, LPC on 08-11-16 and Pauline Good, PA-C on 09-06-16.  Encouraged support groups.  Highly recommended [facility]AG along with DBT groups.  R:  Pt receptive.

## 2016-08-10 NOTE — Progress Notes (Signed)
    Daily Group Progress Note  Program: IOP  Group Time: 9:00-12:00  Participation Level: Active  Behavioral Response: Appropriate  Type of Therapy:  Group Therapy  Summary of Progress: Pt. Presents as talkative, engaged in the group process. Pt. participated in discussion about developing acceptance of self and others and realization that our actions cannot change others unless they have the desire to change. Pt. Discussed her fears about expressing her sadness and anger. Pt. Was encouraged to give herself permission to feel her feelings.      Nancie Neas, LPC

## 2016-08-10 NOTE — Progress Notes (Signed)
    Daily Group Progress Note  Program: IOP  Group Time: 9:00-12:00  Participation Level: Active  Behavioral Response: Appropriate  Type of Therapy:  Group Therapy  Summary of Progress: Pt. Shared with the group that she felt "undecided", "cautious", and "sad". Pt. Attributed these feelings to not wanting to leave group tomorrow. Pt. Discussed an ongoing fear of not wanting to say too much in group or with her therapist. Pt. Was encouraged to continue to be open in therapy, so that she can get the help that she needs. Pt. Was given positive feedback from the group about her participation and support of others and to focus on her plan of returning to work with healthy boundaries and continuing setting boundaries with her mother so that she can focus on her self-care.      Nancie Neas, LPC

## 2016-08-11 ENCOUNTER — Other Ambulatory Visit (HOSPITAL_COMMUNITY): Payer: PPO

## 2016-08-14 ENCOUNTER — Other Ambulatory Visit (HOSPITAL_COMMUNITY): Payer: PPO

## 2016-08-15 ENCOUNTER — Other Ambulatory Visit (HOSPITAL_COMMUNITY): Payer: PPO

## 2016-08-16 ENCOUNTER — Telehealth: Payer: Self-pay | Admitting: Family Medicine

## 2016-08-16 ENCOUNTER — Other Ambulatory Visit (HOSPITAL_COMMUNITY): Payer: PPO

## 2016-08-16 NOTE — Telephone Encounter (Signed)
Patient is experiencing tremendous pain in her L hand and arm. It has also been swollen for about a week and a half now.   Patient requesting advice on what to do for pain and swelling

## 2016-08-16 NOTE — Telephone Encounter (Signed)
Plan was to go ahead with MRI of her wrist at last office visit.

## 2016-08-17 ENCOUNTER — Other Ambulatory Visit (HOSPITAL_COMMUNITY): Payer: PPO

## 2016-08-17 NOTE — Addendum Note (Signed)
Addended by: Sherrie George F on: 08/17/2016 08:13 AM   Modules accepted: Orders

## 2016-08-17 NOTE — Telephone Encounter (Signed)
Radiology to contact patient and set up appointment for MRI.

## 2016-08-18 ENCOUNTER — Other Ambulatory Visit (HOSPITAL_COMMUNITY): Payer: PPO

## 2016-08-18 ENCOUNTER — Telehealth: Payer: Self-pay | Admitting: Family Medicine

## 2016-08-18 NOTE — Telephone Encounter (Signed)
Patient would like advice on how many times a day she should be icing her wrist/arm and how much ibuprofen to take to help with the swelling

## 2016-08-18 NOTE — Telephone Encounter (Signed)
Called patient and gave her  Information.

## 2016-08-18 NOTE — Telephone Encounter (Signed)
Ice at least 3-4 times a day but can do it up to hourly.  Ibuprofen 600mg  three times a day with food.

## 2016-08-21 ENCOUNTER — Other Ambulatory Visit (HOSPITAL_COMMUNITY): Payer: PPO

## 2016-08-21 ENCOUNTER — Ambulatory Visit: Payer: PPO | Admitting: Neurology

## 2016-08-22 ENCOUNTER — Other Ambulatory Visit (HOSPITAL_COMMUNITY): Payer: PPO

## 2016-08-23 ENCOUNTER — Other Ambulatory Visit (HOSPITAL_COMMUNITY): Payer: PPO

## 2016-08-24 ENCOUNTER — Other Ambulatory Visit (HOSPITAL_COMMUNITY): Payer: PPO

## 2016-08-25 ENCOUNTER — Encounter: Payer: Self-pay | Admitting: Family Medicine

## 2016-08-25 ENCOUNTER — Ambulatory Visit (HOSPITAL_BASED_OUTPATIENT_CLINIC_OR_DEPARTMENT_OTHER)
Admission: RE | Admit: 2016-08-25 | Discharge: 2016-08-25 | Disposition: A | Discharge status: Home / Self Care | Payer: PPO | Source: Ambulatory Visit | Attending: Family Medicine | Admitting: Family Medicine

## 2016-08-25 ENCOUNTER — Ambulatory Visit (INDEPENDENT_AMBULATORY_CARE_PROVIDER_SITE_OTHER): Payer: PPO | Admitting: Family Medicine

## 2016-08-25 ENCOUNTER — Other Ambulatory Visit (HOSPITAL_COMMUNITY): Payer: PPO

## 2016-08-25 VITALS — BP 108/73 | HR 63 | Ht 70.0 in | Wt 267.0 lb

## 2016-08-25 DIAGNOSIS — X58XXXA Exposure to other specified factors, initial encounter: Secondary | ICD-10-CM | POA: Insufficient documentation

## 2016-08-25 DIAGNOSIS — S99921D Unspecified injury of right foot, subsequent encounter: Secondary | ICD-10-CM | POA: Insufficient documentation

## 2016-08-25 DIAGNOSIS — M25532 Pain in left wrist: Secondary | ICD-10-CM | POA: Diagnosis not present

## 2016-08-25 DIAGNOSIS — M7989 Other specified soft tissue disorders: Secondary | ICD-10-CM | POA: Diagnosis not present

## 2016-08-25 DIAGNOSIS — S99921A Unspecified injury of right foot, initial encounter: Secondary | ICD-10-CM

## 2016-08-25 NOTE — Progress Notes (Signed)
PCP: Aretta Nip, MD  Subjective:   HPI: Patient is a 46 y.o. female here for right foot injury.  Patient reports on Monday she accidentally ran into a large frame with her right foot. Pain in 3rd to 5th toes and these parts of foot. Some swelling and bruising. Pain level 6/10 and sharp, worse with walking. No other skin changes. No numbness.  Past Medical History:  Diagnosis Date  . Achilles tendinitis   . Achilles tendinitis   . ADHD (attention deficit hyperactivity disorder)   . Allergy   . Arthritis   . Bipolar affective ([place])   . Bipolar disorder ([place])   . Cataracts, bilateral   . Eczema   . Ganglion cyst 09/29/2009   left wrist (2 cyst)  . Hyperprolactinemia ([place])   . Hypertension   . Lipoma   . Migraine   . Personality disorder   . Pes planus     Current Outpatient Prescriptions on File Prior to Visit  Medication Sig Dispense Refill  . divalproex (DEPAKOTE ER) 500 MG 24 hr tablet Take 3 tablets (1,500 mg total) by mouth at bedtime. 90 tablet 0  . hydrOXYzine (ATARAX/VISTARIL) 50 MG tablet Take 1 tablet (50 mg total) by mouth 3 (three) times daily as needed for anxiety. 60 tablet 0  . lamoTRIgine (LAMICTAL) 200 MG tablet Take 1 tablet (200 mg total) by mouth at bedtime. 30 tablet 0  . lurasidone (LATUDA) 80 MG TABS tablet Take 1 tablet (80 mg total) by mouth daily with supper. 30 tablet 0  . metoprolol succinate (TOPROL-XL) 100 MG 24 hr tablet Take 1 tablet (100 mg total) by mouth daily. Take with or immediately following a meal. 30 tablet 0  . sertraline (ZOLOFT) 50 MG tablet Take 1 tablet (50 mg total) by mouth daily. 30 tablet 0  . traZODone (DESYREL) 50 MG tablet Take 1 tablet (50 mg total) by mouth at bedtime as needed for sleep. 30 tablet 0   No current facility-administered medications on file prior to visit.     Past Surgical History:  Procedure Laterality Date  . ANKLE SURGERY  12/88   left   . chest nodule  1990?   rt chest wall nodule  removal  . GANGLION CYST EXCISION  2011  . lipoma removal    . right bunioectomy    . SHOULDER SURGERY  01/13/2011   right, partial tear  . tumor resection left thigh      Allergies  Allergen Reactions  . Adhesive [Tape] Itching and Rash    Also reacted to Steri Strips and Band-Aids.  . Dilaudid [Hydromorphone Hcl] Itching  . Morphine Nausea And Vomiting  . Penicillins Hives    Has patient had a PCN reaction causing immediate rash, facial/tongue/throat swelling, SOB or lightheadedness with hypotension: YES Has patient had a PCN reaction causing severe rash involving mucus membranes or skin necrosis: NO Has patient had a PCN reaction that required hospitalization NO Has patient had a PCN reaction occurring within the last 10 years:NO If all of the above answers are "NO", then may proceed with Cephalosporin use.  Marland Kitchen Percocet [Oxycodone-Acetaminophen] Itching  . Prednisone Hives  . Provera [Medroxyprogesterone Acetate] Other (See Comments)    Causes manic episodes  . Ultram [Tramadol Hcl] Itching    Social History   Social History  . Marital status: Single    Spouse name: N/A  . Number of children: 0  . Years of education: N/A   Occupational History  .  [employer]   Social History Main Topics  . Smoking status: Never Smoker  . Smokeless tobacco: Never Used  . Alcohol use No  . Drug use: No  . Sexual activity: No   Other Topics Concern  . Not on file   Social History Narrative   Caffeine  2 sodas daily, 1 cup coffee daily.    Family History  Problem Relation Age of Onset  . Hypertension Mother   . Hyperlipidemia Mother   . Heart attack Father   . Heart disease Father   . Hypertension Father   . Bipolar disorder Father   . Diabetes Paternal Grandfather   . Heart disease Maternal Aunt   . Breast cancer Maternal Aunt   . Heart disease Maternal Grandmother   . Cancer Maternal Grandmother        colon    BP 108/73   Pulse 63   Ht 5\' 10"  (1.778 m)   Wt 267 lb  (121.1 kg)   LMP 08/04/2016   BMI 38.31 kg/m   Review of Systems: See HPI above.     Objective:  Physical Exam:  Gen: NAD, comfortable in exam room  Right foot/ankle: No gross deformity.  Mild swelling dorsal foot.  No ecchymoses. FROM ankle without pain.  Minimal pain with flexion and extension of digits. TTP distal 4th metatarsal the greatest, less 3rd and 5th distal metatarsals and proximal phalanx 4th digit. Negative ant drawer and talar tilt.   Negative syndesmotic compression. Painful metatarsal squeeze. Thompsons test negative. NV intact distally.  Left foot/ankle: FROM without pain.   Assessment & Plan:  1. Right foot injury - independently reviewed radiographs and no evidence fracture.  Consistent with metatarsal contusion and sprain.  Boot for comfort and support.  Ambulation as tolerated.  Icing, ibuprofen.  Elevation as needed.  Expect 2-4 weeks to completely resolve.  F/u prn for this issue.

## 2016-08-25 NOTE — Patient Instructions (Addendum)
You have a metatarsal contusion and sprain. Ice the area 15 minutes at a time 3-4 times a day. Boot when up and walking around (or postop shoe) to help rest this. Elevate above your heart when possible. Ibuprofen 600mg  three times a day with food. Expect this to take 2-4 weeks to resolve.

## 2016-08-25 NOTE — Assessment & Plan Note (Signed)
independently reviewed radiographs and no evidence fracture.  Consistent with metatarsal contusion and sprain.  Boot for comfort and support.  Ambulation as tolerated.  Icing, ibuprofen.  Elevation as needed.  Expect 2-4 weeks to completely resolve.  F/u prn for this issue.

## 2016-08-26 ENCOUNTER — Ambulatory Visit (HOSPITAL_BASED_OUTPATIENT_CLINIC_OR_DEPARTMENT_OTHER)
Admission: RE | Admit: 2016-08-26 | Discharge: 2016-08-26 | Disposition: A | Discharge status: Home / Self Care | Payer: PPO | Source: Ambulatory Visit | Attending: Family Medicine | Admitting: Family Medicine

## 2016-08-26 DIAGNOSIS — S99921A Unspecified injury of right foot, initial encounter: Secondary | ICD-10-CM | POA: Diagnosis not present

## 2016-08-26 DIAGNOSIS — M25532 Pain in left wrist: Secondary | ICD-10-CM

## 2016-08-28 ENCOUNTER — Other Ambulatory Visit (HOSPITAL_COMMUNITY): Payer: PPO

## 2016-08-29 ENCOUNTER — Other Ambulatory Visit (HOSPITAL_COMMUNITY): Payer: PPO

## 2016-08-30 ENCOUNTER — Other Ambulatory Visit (HOSPITAL_COMMUNITY): Payer: PPO

## 2016-08-30 DIAGNOSIS — M940 Chondrocostal junction syndrome [Tietze]: Secondary | ICD-10-CM | POA: Diagnosis not present

## 2016-08-30 DIAGNOSIS — J45909 Unspecified asthma, uncomplicated: Secondary | ICD-10-CM | POA: Diagnosis not present

## 2016-08-31 ENCOUNTER — Other Ambulatory Visit (HOSPITAL_COMMUNITY): Payer: PPO

## 2016-09-01 ENCOUNTER — Other Ambulatory Visit (HOSPITAL_COMMUNITY): Payer: PPO

## 2016-09-04 ENCOUNTER — Other Ambulatory Visit (HOSPITAL_COMMUNITY): Payer: PPO

## 2016-09-05 ENCOUNTER — Other Ambulatory Visit (HOSPITAL_COMMUNITY): Payer: PPO

## 2016-09-06 ENCOUNTER — Other Ambulatory Visit (HOSPITAL_COMMUNITY): Payer: PPO

## 2016-09-07 ENCOUNTER — Other Ambulatory Visit (HOSPITAL_COMMUNITY): Payer: PPO

## 2016-09-08 ENCOUNTER — Other Ambulatory Visit (HOSPITAL_COMMUNITY): Payer: PPO

## 2016-09-11 ENCOUNTER — Other Ambulatory Visit (HOSPITAL_COMMUNITY): Payer: PPO

## 2016-09-12 ENCOUNTER — Other Ambulatory Visit (HOSPITAL_COMMUNITY): Payer: PPO

## 2016-09-13 ENCOUNTER — Other Ambulatory Visit (HOSPITAL_COMMUNITY): Payer: PPO

## 2016-09-14 ENCOUNTER — Other Ambulatory Visit (HOSPITAL_COMMUNITY): Payer: PPO

## 2016-09-15 ENCOUNTER — Other Ambulatory Visit (HOSPITAL_COMMUNITY): Payer: PPO

## 2016-09-18 ENCOUNTER — Encounter: Payer: Self-pay | Admitting: Family Medicine

## 2016-09-18 ENCOUNTER — Ambulatory Visit (INDEPENDENT_AMBULATORY_CARE_PROVIDER_SITE_OTHER): Payer: PPO | Admitting: Family Medicine

## 2016-09-18 ENCOUNTER — Other Ambulatory Visit (HOSPITAL_COMMUNITY): Payer: PPO

## 2016-09-18 DIAGNOSIS — M79671 Pain in right foot: Secondary | ICD-10-CM | POA: Diagnosis not present

## 2016-09-18 DIAGNOSIS — T148XXA Other injury of unspecified body region, initial encounter: Secondary | ICD-10-CM

## 2016-09-18 MED ORDER — DICLOFENAC SODIUM 75 MG PO TBEC: 75.0000 mg | DELAYED_RELEASE_TABLET | Freq: Two times a day (BID) | ORAL | 1 refills | Status: DC

## 2016-09-18 NOTE — Patient Instructions (Signed)
You have the delayed onset muscle soreness in trapezius muscles, rotator cuff, left forearm, left wrist, left thumb thenar, right calf muscles. This usually gets worse over 3-7 days from the incident then starts to improve. You also aggravated your metatarsal contusion/sprain. Ice or heat (whichever feels better at this point) 15 minutes at a time 3-4 times a day. Diclofenac 75mg  twice a day with food for pain and inflammation. Skelaxin (if you need a refill let us know) up to 3 times a day for spasms. Tylenol 500mg  1-2 tabs three times a day as needed. Topical capsaicin, biofreeze, or salon pas up to 4 times a day for pain. Follow up with me in 2 weeks for reevaluation.

## 2016-09-19 ENCOUNTER — Other Ambulatory Visit (HOSPITAL_COMMUNITY): Payer: PPO

## 2016-09-19 DIAGNOSIS — S161XXA Strain of muscle, fascia and tendon at neck level, initial encounter: Secondary | ICD-10-CM | POA: Insufficient documentation

## 2016-09-19 DIAGNOSIS — T148XXA Other injury of unspecified body region, initial encounter: Secondary | ICD-10-CM | POA: Insufficient documentation

## 2016-09-19 NOTE — Assessment & Plan Note (Signed)
worsened contusion and sprain of this foot.  Reassured.  Ice/heat, diclofenac, tylenol, topical medicines.  Cam walker.

## 2016-09-19 NOTE — Assessment & Plan Note (Signed)
4 days out from injury with multiple muscle group involvement but otherwise reassuring exam - specifically strains of trapezius muscles, rotator cuff, left forearm, left thenar, right calf muscles. Diclofenac with skelaxin as needed.  Tylenol, topical medicines as needed.
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
## 2016-10-03 ENCOUNTER — Ambulatory Visit (INDEPENDENT_AMBULATORY_CARE_PROVIDER_SITE_OTHER): Payer: PPO | Admitting: Family Medicine

## 2016-10-03 ENCOUNTER — Ambulatory Visit (HOSPITAL_BASED_OUTPATIENT_CLINIC_OR_DEPARTMENT_OTHER)
Admission: RE | Admit: 2016-10-03 | Discharge: 2016-10-03 | Disposition: A | Discharge status: Home / Self Care | Payer: PPO | Source: Ambulatory Visit | Attending: Family Medicine | Admitting: Family Medicine

## 2016-10-03 ENCOUNTER — Encounter: Payer: Self-pay | Admitting: Family Medicine

## 2016-10-03 VITALS — BP 102/71 | HR 65 | Ht 70.0 in | Wt 267.0 lb

## 2016-10-03 DIAGNOSIS — S6992XD Unspecified injury of left wrist, hand and finger(s), subsequent encounter: Secondary | ICD-10-CM | POA: Diagnosis not present

## 2016-10-03 DIAGNOSIS — S99921D Unspecified injury of right foot, subsequent encounter: Secondary | ICD-10-CM

## 2016-10-03 DIAGNOSIS — X58XXXD Exposure to other specified factors, subsequent encounter: Secondary | ICD-10-CM | POA: Insufficient documentation

## 2016-10-03 DIAGNOSIS — S6992XA Unspecified injury of left wrist, hand and finger(s), initial encounter: Secondary | ICD-10-CM | POA: Diagnosis not present

## 2016-10-03 DIAGNOSIS — M7989 Other specified soft tissue disorders: Secondary | ICD-10-CM | POA: Diagnosis not present

## 2016-10-03 DIAGNOSIS — M25532 Pain in left wrist: Secondary | ICD-10-CM | POA: Diagnosis not present

## 2016-10-03 NOTE — Patient Instructions (Signed)
Get x-rays downstairs and come back up here if you have time. Assuming these are normal, we will check on the occupational therapy for you.  Stop the boot and wear supportive shoes with sports insoles. Avoid flat shoes, barefoot walking as much as possible. Follow up with me in 4 weeks. If not improving would consider MRI of your foot at that time.

## 2016-10-04 NOTE — Assessment & Plan Note (Signed)
independently reviewed repeat radiographs today and no evidence fracture.  Continue with thumb spica brace, icing, diclofenac, tylenol.  Start occupational therapy.

## 2016-10-04 NOTE — Assessment & Plan Note (Signed)
pain now diffuse within foot and ankle.  Switch to supportive shoes with sports insoles.  Avoid flat shoes, barefoot walking.  Ice/heat, diclofenac, tylenol, topical medicines.  F/u in 4 weeks for reevaluation.
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
## 2016-10-05 ENCOUNTER — Other Ambulatory Visit (HOSPITAL_COMMUNITY): Payer: Self-pay | Admitting: Psychiatry

## 2016-10-31 ENCOUNTER — Ambulatory Visit (INDEPENDENT_AMBULATORY_CARE_PROVIDER_SITE_OTHER): Payer: PPO | Admitting: Family Medicine

## 2016-10-31 ENCOUNTER — Encounter: Payer: Self-pay | Admitting: Family Medicine

## 2016-10-31 DIAGNOSIS — S99921D Unspecified injury of right foot, subsequent encounter: Secondary | ICD-10-CM

## 2016-10-31 DIAGNOSIS — S6992XD Unspecified injury of left wrist, hand and finger(s), subsequent encounter: Secondary | ICD-10-CM

## 2016-10-31 NOTE — Patient Instructions (Signed)
Take the diclofenac twice a day with food. We will go ahead with an MRI of your foot to assess for an occult fracture, lis franc injury. Continue with the thumb spica brace for your wrist and thumb. Start occupational therapy for this also. Follow up with me in about 5-6 weeks for reevaluation of your wrist. I'll contact you with the MRI results of your foot.

## 2016-11-01 NOTE — Assessment & Plan Note (Signed)
Diffuse pain in foot radiating upwards.  No evidence of CRPS.  Radiographs were negative.  Will go ahead with MRI to assess for occult fracture.  Continue inserts, diclofenac, ice/heat in meantime.
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
## 2016-11-01 NOTE — Assessment & Plan Note (Signed)
Radiographs were negative.  has not yet started OT but due to soon - go ahead with this and continued bracing.  Consider repeat MRI if not improving but doubt anything that would change management based on exam.  Diclofenac twice a day, icing.  F/u in 5-6 weeks.

## 2016-11-03 ENCOUNTER — Ambulatory Visit: Payer: PPO | Attending: Family Medicine | Admitting: Occupational Therapy

## 2016-11-03 DIAGNOSIS — M6281 Muscle weakness (generalized): Secondary | ICD-10-CM | POA: Diagnosis not present

## 2016-11-03 DIAGNOSIS — M25542 Pain in joints of left hand: Secondary | ICD-10-CM | POA: Diagnosis not present

## 2016-11-03 DIAGNOSIS — R29898 Other symptoms and signs involving the musculoskeletal system: Secondary | ICD-10-CM | POA: Insufficient documentation

## 2016-11-03 DIAGNOSIS — M25642 Stiffness of left hand, not elsewhere classified: Secondary | ICD-10-CM | POA: Insufficient documentation

## 2016-11-03 DIAGNOSIS — M25632 Stiffness of left wrist, not elsewhere classified: Secondary | ICD-10-CM | POA: Diagnosis not present

## 2016-11-03 DIAGNOSIS — M25532 Pain in left wrist: Secondary | ICD-10-CM
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
## 2016-11-03 NOTE — Patient Instructions (Signed)
Wear your thumb spica most of the time to minimize pain. Ice thumb and wrist for  1-2 x day for 10 mins, wrap ice pack in a towel. Stop if increased pain or problems.

## 2016-11-07 ENCOUNTER — Ambulatory Visit: Payer: PPO | Admitting: Occupational Therapy

## 2016-11-07 DIAGNOSIS — M25642 Stiffness of left hand, not elsewhere classified: Secondary | ICD-10-CM

## 2016-11-07 DIAGNOSIS — M25532 Pain in left wrist: Secondary | ICD-10-CM | POA: Diagnosis not present

## 2016-11-07 DIAGNOSIS — M25632 Stiffness of left wrist, not elsewhere classified: Secondary | ICD-10-CM

## 2016-11-07 DIAGNOSIS — M25542 Pain in joints of left hand: Secondary | ICD-10-CM

## 2016-11-07 NOTE — Therapy (Signed)
[facility] [address] [place], [place], [address] Phone: [phone]   Fax:  [phone]  Occupational Therapy Treatment  Patient Details  Name: [name] MRN: [id number] Date of Birth: 09/05/1970 Referring Provider: Dr. Barbaraann Barthel  Encounter Date: 11/07/2016      OT End of Session - 11/07/16 0841    Visit Number 2   Number of Visits 16   Date for OT Re-Evaluation 01/02/17   Authorization Type [employer]   Authorization Time Period G-code every 10th visit   Authorization - Visit Number 2   Authorization - Number of Visits 10   OT Start Time 0802   OT Stop Time 0848   OT Time Calculation (min) 46 min   Activity Tolerance Patient tolerated treatment well      Past Medical History:  Diagnosis Date  . Achilles tendinitis   . Achilles tendinitis   . ADHD (attention deficit hyperactivity disorder)   . Allergy   . Arthritis   . Bipolar affective ([place])   . Bipolar disorder ([place])   . Cataracts, bilateral   . Eczema   . Ganglion cyst 09/29/2009   left wrist (2 cyst)  . Hyperprolactinemia ([place])   . Hypertension   . Lipoma   . Migraine   . Personality disorder ([place])   . Pes planus     Past Surgical History:  Procedure Laterality Date  . ANKLE SURGERY  12/88   left   . chest nodule  1990?   rt chest wall nodule removal  . GANGLION CYST EXCISION  2011  . lipoma removal    . right bunioectomy    . SHOULDER SURGERY  01/13/2011   right, partial tear  . tumor resection left thigh      There were no vitals filed for this visit.      Subjective Assessment - 11/07/16 0807    Pertinent History wrist and thumb pain, hx of extensor tendonitis, bipolar d/o, ADHD, borderline personality hx of [facility] admissions, (currently receiving outpatient tx), RTC tear, gaglion cyst left wrist   Patient Stated Goals improve LUE pain and functional use   Currently in Pain? Yes   Pain Score 6    Pain Location Wrist   and thumb   Pain Orientation Left   Pain Descriptors / Indicators Aching   Pain Type Chronic pain   Pain Onset More than a month ago   Pain Frequency Intermittent   Aggravating Factors  malpositioning, overuse   Pain Relieving Factors meds                      OT Treatments/Exercises (OP) - 11/07/16 0001      ADLs   ADL Comments Pt issued and reviewed precautions/contraindications for iontophoresis - pt had no contraindications     Exercises   Exercises Wrist;Hand     Wrist Exercises   Other wrist exercises Pt issued wrist flex/ext HEP - see pt instructions for details     Hand Exercises   Other Hand Exercises Thumb flex/ext ex - see pt instructions for details     Modalities   Modalities Iontophoresis     Iontophoresis   Type of Iontophoresis Dexamethasone   Location Radial base of thumb   Dose 1.5 cc, 2.2 mA   Time x 18 min. running time                OT Education - 11/07/16 0819    Education provided  Yes   Education Details Wrist and thumb A/ROM HEP, precautions/contraindications for iontophoresis   Person(s) Educated Patient   Methods Explanation;Demonstration;Handout   Comprehension Verbalized understanding;Returned demonstration          OT Short Term Goals - 11/07/16 0842      OT SHORT TERM GOAL #1   Title Independent with initial A/ROM HEP    Time 4   Period Weeks   Status On-going     OT SHORT TERM GOAL #2   Title Pain to be 4/10 or under with light functional tasks and A/ROM ex's   Time 4   Period Weeks   Status New     OT SHORT TERM GOAL #3   Title Pt to verbalize understanding with task modifications, compensatory strategies and A/E needs to reduce pain   Time 4   Period Weeks   Status New     OT SHORT TERM GOAL #4   Title Pt will increase wrist flexion/ extension to 55* or greater for increased functional use.   Baseline wrist flex/ ext: 45/45   Time 4   Period Weeks   Status New           OT Long  Term Goals - 11/03/16 1629      OT LONG TERM GOAL #1   Title Independent with updated strengthening HEP   Time 8   Period Weeks   Status New   Target Date 01/02/17     OT LONG TERM GOAL #2   Title Improve grip strength Lt hand by 10 lbs or greater to open jars/containers   Baseline RUE 65 lbs, LUE 15 lbs   Time 8   Period Weeks   Status New     OT LONG TERM GOAL #3   Title Assess pinch strength and set goal PRN   Time 8   Period Weeks   Status New     OT LONG TERM GOAL #4   Title Lt thumb MP flexion to increase to 50* or greater for functional tasks   Time 8   Period Weeks   Status New     OT LONG TERM GOAL #5   Title Pt will report ability to perform work activities with wrist and thumb pain no greater than 3/10.   Baseline Pain 5/10 currently   Time 8   Period Weeks   Status New               Plan - 11/07/16 0737    Clinical Impression Statement Pt tolerating iontophoresis. Pt limited by chronic pain   Rehab Potential Good   Current Impairments/barriers affecting progress: history of multiple injuries to left wrist   OT Frequency 2x / week   OT Duration 8 weeks   OT Treatment/Interventions Self-care/ADL training;Moist Heat;Fluidtherapy;DME and/or AE instruction;Splinting;Patient/family education;Therapeutic exercises;Ultrasound;Therapeutic exercise;Therapeutic activities;Passive range of motion;Iontophoresis;Cryotherapy;Electrical Stimulation;Parrafin;Energy conservation;Manual Therapy   Plan continue ionto dose #2, continue A/ROM wrist and thumb, task modifications/adaptations   Consulted and Agree with Plan of Care Patient      Patient will benefit from skilled therapeutic intervention in order to improve the following deficits and impairments:  Decreased coordination, Decreased range of motion, Decreased strength, Pain, Impaired UE functional use, Decreased knowledge of use of DME, Decreased activity tolerance, Increased edema, Decreased endurance,  Impaired flexibility  Visit Diagnosis: Pain in joints of left hand  Pain in left wrist  Stiffness of left wrist, not elsewhere classified  Stiffness of left hand, not elsewhere classified  Problem List Patient Active Problem List   Diagnosis Date Noted  . Muscle strain 09/19/2016  . Right foot injury, subsequent encounter 08/25/2016  . Low back pain 06/27/2016  . Left wrist injury, subsequent encounter 06/27/2016  . Pain of left thumb 10/07/2015  . 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 Strain of left thumb 02/11/2015  . Strain of right forearm 02/11/2015  . Right shoulder pain 12/31/2014  . Injury of right little finger 12/31/2014  . Episodic cluster headache, 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  . 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 Hypersomnia, recurrent 12/02/2014  . Migraine aura, persistent, intractable, with status migrainosus 12/02/2014  . Lower back injury 09/30/2014  . Bipolar I disorder, most recent episode depressed ([place])   . MDD (major depressive disorder), recurrent, severe, with psychosis ([place]) 09/23/2014  . Injury of left index finger 08/20/2014  . Right ankle sprain 06/01/2014  . Contusion, multiple sites 06/01/2014  . Strain of right gastrocnemius muscle 06/01/2014  . Phonophobia 05/04/2014  . Photophobia of both eyes 05/04/2014  . Emotionally unstable borderline personality disorder ([place]) 05/04/2014  . Nausea with vomiting 05/04/2014  . Mixed bipolar I disorder ([place])   . Bipolar I disorder, most recent episode mixed ([place]) 04/18/2014  . Bipolar affective disorder, depressed, mild ([place]) 04/12/2014  . Suicidal ideation 04/12/2014  . Injury of right shoulder and upper arm 02/17/2014  . Left leg pain 06/03/2013  . Right hip pain 06/03/2013  . Migraine with status migrainosus 01/08/2013  . Personality disorder ([place])   . Chronic migraine 05/08/2012  . Contact dermatitis 11/27/2011  .  Major depressive disorder, recurrent episode ([place]) 10/27/2011  . Generalized anxiety disorder 10/27/2011  . 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 Borderline personality disorder ([place]) 10/27/2011  . Right foot pain 09/28/2011  . 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 Ganglion cyst 09/29/2009  . PES PLANUS 07/01/2008  . BIPOLAR DISORDER UNSPECIFIED 06/09/2008    Carey Bullocks, OTR/L 11/07/2016, 10:13 AM  [facility] [address] [place], [place], [address] Phone: [phone]   Fax:  [phone]  Name: [name] MRN: [id number] Date of Birth: [birth year]

## 2016-11-07 NOTE — Patient Instructions (Signed)
AROM: Wrist Extension   .  With _Lt___ palm down, bend wrist up. Repeat __10__ times per set.  Do __4__ sessions per day. Keep thumb relaxed    AROM: Thumb Flexion / Extension    Actively bend right thumb across palm as far as possible. Hold _3___ seconds. Relax. Then pull thumb back into hitchhike position. KEEP WRIST NEUTRAL Repeat _10___ times per set. Do __1__ sets per session. Do __4__ sessions per day.

## 2016-11-09 ENCOUNTER — Ambulatory Visit: Payer: PPO | Admitting: Occupational Therapy

## 2016-11-09 DIAGNOSIS — M25632 Stiffness of left wrist, not elsewhere classified: Secondary | ICD-10-CM

## 2016-11-09 DIAGNOSIS — R29898 Other symptoms and signs involving the musculoskeletal system: Secondary | ICD-10-CM

## 2016-11-09 DIAGNOSIS — R21 Rash and other nonspecific skin eruption: Secondary | ICD-10-CM | POA: Diagnosis not present

## 2016-11-09 DIAGNOSIS — M25532 Pain in left wrist: Secondary | ICD-10-CM

## 2016-11-09 DIAGNOSIS — M25542 Pain in joints of left hand: Secondary | ICD-10-CM

## 2016-11-09 DIAGNOSIS — J3089 Other allergic rhinitis: Secondary | ICD-10-CM | POA: Diagnosis not present

## 2016-11-09 DIAGNOSIS — J301 Allergic rhinitis due to pollen: Secondary | ICD-10-CM | POA: Diagnosis not present

## 2016-11-09 DIAGNOSIS — M25642 Stiffness of left hand, not elsewhere classified: Secondary | ICD-10-CM

## 2016-11-09 DIAGNOSIS — M6281 Muscle weakness (generalized): Secondary | ICD-10-CM

## 2016-11-09 NOTE — Therapy (Addendum)
[facility] [address] [place], [place], [address] Phone: [phone]   Fax:  [phone]  Occupational Therapy Treatment  Patient Details  Name: [name] MRN: [id number] Date of Birth: 1970/03/20 Referring Provider: Dr. Barbaraann Barthel  Encounter Date: 11/09/2016      OT End of Session - 11/09/16 1002    Visit Number 3   Number of Visits 16   Date for OT Re-Evaluation 01/02/17   Authorization Type [employer]   Authorization Time Period G-code every 10th visit   Authorization - Visit Number 3   Authorization - Number of Visits 10   OT Start Time 9233   OT Stop Time 1015   OT Time Calculation (min) 40 min   Activity Tolerance Patient tolerated treatment well   Behavior During Therapy [facility] for tasks assessed/performed      Past Medical History:  Diagnosis Date  . Achilles tendinitis   . Achilles tendinitis   . ADHD (attention deficit hyperactivity disorder)   . Allergy   . Arthritis   . Bipolar affective ([place])   . Bipolar disorder ([place])   . Cataracts, bilateral   . Eczema   . Ganglion cyst 09/29/2009   left wrist (2 cyst)  . Hyperprolactinemia ([place])   . Hypertension   . Lipoma   . Migraine   . Personality disorder ([place])   . Pes planus     Past Surgical History:  Procedure Laterality Date  . ANKLE SURGERY  12/88   left   . chest nodule  1990?   rt chest wall nodule removal  . GANGLION CYST EXCISION  2011  . lipoma removal    . right bunioectomy    . SHOULDER SURGERY  01/13/2011   right, partial tear  . tumor resection left thigh      There were no vitals filed for this visit.      Subjective Assessment - 11/09/16 0937    Pertinent History wrist and thumb pain, hx of extensor tendonitis, bipolar d/o, ADHD, borderline personality hx of [facility] admissions, (currently receiving outpatient tx), RTC tear, gaglion cyst left wrist   Patient Stated Goals improve LUE pain and functional use   Currently in Pain? Yes   Pain Score 5    Pain Location Wrist   Pain Orientation Left   Pain Descriptors / Indicators Aching   Pain Type Chronic pain   Pain Onset More than a month ago   Pain Frequency Intermittent   Aggravating Factors  malpositioning   Pain Relieving Factors meds         Treatment:  ADLs  ADL Comments Pt was instructed to call MD if rash worsens.    Exercises  Exercises Wrist;Hand    Wrist Exercises  Other wrist exercises Reviewed wrist flex/ext HEP    Hand Exercises  Other Hand Exercises Thumb flex/ext ext isolated    Modalities  Modalities Iontophoresis    Iontophoresis  Type of Iontophoresis Dexamethasone  Location Radial wrist  Dose 1.5 cc, 1.0 then slowly increased to  1.8 mA  Time x 23 min. running time, mild redness at medication and ground pad sites, no blisters present, lotion applied     education regarding avoiding use of ice/ heat today. Began discussion of positions to avoid particularly wrist and thumb flexion simultaneously. Therapist encouraged use of wrist brace for all provoking activities. Pt was instructed that she may resume use of ice tomorrow.  OT Short Term Goals - 11/07/16 4696      OT SHORT TERM GOAL #1   Title Independent with initial A/ROM HEP    Time 4   Period Weeks   Status On-going     OT SHORT TERM GOAL #2   Title Pain to be 4/10 or under with light functional tasks and A/ROM ex's   Time 4   Period Weeks   Status New     OT SHORT TERM GOAL #3   Title Pt to verbalize understanding with task modifications, compensatory strategies and A/E needs to reduce pain   Time 4   Period Weeks   Status New     OT SHORT TERM GOAL #4   Title Pt will increase wrist flexion/ extension to 55* or greater for increased functional use.   Baseline wrist flex/ ext: 45/45   Time 4   Period Weeks   Status New           OT Long Term Goals - 11/03/16 1629      OT LONG TERM GOAL #1    Title Independent with updated strengthening HEP   Time 8   Period Weeks   Status New   Target Date 01/02/17     OT LONG TERM GOAL #2   Title Improve grip strength Lt hand by 10 lbs or greater to open jars/containers   Baseline RUE 65 lbs, LUE 15 lbs   Time 8   Period Weeks   Status New     OT LONG TERM GOAL #3   Title Assess pinch strength and set goal PRN   Time 8   Period Weeks   Status New     OT LONG TERM GOAL #4   Title Lt thumb MP flexion to increase to 50* or greater for functional tasks   Time 8   Period Weeks   Status New     OT LONG TERM GOAL #5   Title Pt will report ability to perform work activities with wrist and thumb pain no greater than 3/10.   Baseline Pain 5/10 currently   Time 8   Period Weeks   Status New               Plan - 11/09/16 1003    Clinical Impression Statement Pt has mild red rash from iontophoresis at medication application site. Pt had similar rash with ionto use previously, however she was able to tolerate. Ionto applied at a differnet site at radial wrist today. Pt was instructed in precautions and to call her MD if rash worsens after iotnot use today.    Rehab Potential Good   Current Impairments/barriers affecting progress: history of multiple injuries to left wrist   OT Frequency 2x / week   OT Duration 8 weeks   Plan monitor skin condition, continue ionto #3 if no significant rash, A/ROM wrist and thumb , task modifications   Consulted and Agree with Plan of Care Patient      Patient will benefit from skilled therapeutic intervention in order to improve the following deficits and impairments:  Decreased coordination, Decreased range of motion, Decreased strength, Pain, Impaired UE functional use, Decreased knowledge of use of DME, Decreased activity tolerance, Increased edema, Decreased endurance, Impaired flexibility  Visit Diagnosis: Pain in joints of left hand  Pain in left wrist  Stiffness of left wrist, not  elsewhere classified  Stiffness of left hand, not elsewhere classified  Muscle weakness (generalized)  Other symptoms and  signs involving the musculoskeletal system    Problem List Patient Active Problem List   Diagnosis Date Noted  . Muscle strain 09/19/2016  . Right foot injury, subsequent encounter 08/25/2016  . Low back pain 06/27/2016  . Left wrist injury, subsequent encounter 06/27/2016  . Pain of left thumb 10/07/2015  . 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 Strain of left thumb 02/11/2015  . Strain of right forearm 02/11/2015  . Right shoulder pain 12/31/2014  . Injury of right little finger 12/31/2014  . Episodic cluster headache, 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  . 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 Hypersomnia, recurrent 12/02/2014  . Migraine aura, persistent, intractable, with status migrainosus 12/02/2014  . Lower back injury 09/30/2014  . Bipolar I disorder, most recent episode depressed ([place])   . MDD (major depressive disorder), recurrent, severe, with psychosis ([place]) 09/23/2014  . Injury of left index finger 08/20/2014  . Right ankle sprain 06/01/2014  . Contusion, multiple sites 06/01/2014  . Strain of right gastrocnemius muscle 06/01/2014  . Phonophobia 05/04/2014  . Photophobia of both eyes 05/04/2014  . Emotionally unstable borderline personality disorder ([place]) 05/04/2014  . Nausea with vomiting 05/04/2014  . Mixed bipolar I disorder ([place])   . Bipolar I disorder, most recent episode mixed ([place]) 04/18/2014  . Bipolar affective disorder, depressed, mild ([place]) 04/12/2014  . Suicidal ideation 04/12/2014  . Injury of right shoulder and upper arm 02/17/2014  . Left leg pain 06/03/2013  . Right hip pain 06/03/2013  . Migraine with status migrainosus 01/08/2013  . Personality disorder ([place])   . Chronic migraine 05/08/2012  . Contact dermatitis 11/27/2011  . Major depressive disorder,  recurrent episode ([place]) 10/27/2011  . Generalized anxiety disorder 10/27/2011  . 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 Borderline personality disorder ([place]) 10/27/2011  . Right foot pain 09/28/2011  . 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 Ganglion cyst 09/29/2009  . PES PLANUS 07/01/2008  . BIPOLAR DISORDER UNSPECIFIED 06/09/2008    RINE,KATHRYN 11/09/2016, 10:08 AM Theone Murdoch, OTR/L [phone] Phone: [phone] 11:03 AM 11/09/16 [facility] [address] [place], [place], [address] Phone: [phone]   Fax:  [phone]  Name: JANDA CARGO MRN: [id number] Date of Birth: 1970/09/10

## 2016-11-10 ENCOUNTER — Other Ambulatory Visit: Payer: Self-pay | Admitting: Family Medicine

## 2016-11-15 ENCOUNTER — Encounter: Payer: Self-pay | Admitting: Occupational Therapy

## 2016-11-15 ENCOUNTER — Ambulatory Visit: Payer: PPO | Admitting: Occupational Therapy

## 2016-11-15 DIAGNOSIS — M25532 Pain in left wrist: Secondary | ICD-10-CM

## 2016-11-15 DIAGNOSIS — M25642 Stiffness of left hand, not elsewhere classified: Secondary | ICD-10-CM

## 2016-11-15 DIAGNOSIS — M25632 Stiffness of left wrist, not elsewhere classified: Secondary | ICD-10-CM

## 2016-11-15 DIAGNOSIS — M25542 Pain in joints of left hand: Secondary | ICD-10-CM

## 2016-11-15 NOTE — Therapy (Signed)
[facility] [address] [place], [place], [address] Phone: [phone]   Fax:  [phone]  Occupational Therapy Treatment  Patient Details  Name: [name] MRN: [id number] Date of Birth: May 26, 1970 Referring Provider: Dr. Barbaraann Barthel  Encounter Date: 11/15/2016      OT End of Session - 11/15/16 0945    OT Start Time 0855   OT Stop Time 0945   OT Time Calculation (min) 50 min      Past Medical History:  Diagnosis Date  . Achilles tendinitis   . Achilles tendinitis   . ADHD (attention deficit hyperactivity disorder)   . Allergy   . Arthritis   . Bipolar affective ([place])   . Bipolar disorder ([place])   . Cataracts, bilateral   . Eczema   . Ganglion cyst 09/29/2009   left wrist (2 cyst)  . Hyperprolactinemia ([place])   . Hypertension   . Lipoma   . Migraine   . Personality disorder ([place])   . Pes planus     Past Surgical History:  Procedure Laterality Date  . ANKLE SURGERY  12/88   left   . chest nodule  1990?   rt chest wall nodule removal  . GANGLION CYST EXCISION  2011  . lipoma removal    . right bunioectomy    . SHOULDER SURGERY  01/13/2011   right, partial tear  . tumor resection left thigh      There were no vitals filed for this visit.      Subjective Assessment - 11/15/16 0901    Subjective  Now when I take off my brace, it hurts in certain positions   Pertinent History wrist and thumb pain, hx of extensor tendonitis, bipolar d/o, ADHD, borderline personality hx of [facility] admissions, (currently receiving outpatient tx), RTC tear, gaglion cyst left wrist   Patient Stated Goals improve LUE pain and functional use   Currently in Pain? Yes   Pain Score 3    Pain Location Wrist   Pain Orientation Left   Pain Descriptors / Indicators Aching   Pain Type Chronic pain   Pain Onset More than a month ago   Pain Frequency Intermittent   Aggravating Factors  exerting force   Pain Relieving  Factors rest, medication   Effect of Pain on Daily Activities limits functional use of left hand   Multiple Pain Sites No                      OT Treatments/Exercises (OP) - 11/15/16 0001      Wrist Exercises   Other wrist exercises Reviewed wrist exercises, encouraged patient to sustain stretch and improve AROM wrist.  Circumduction both directions x 10 reps with mild increase in symptoms     Hand Exercises   Other Hand Exercises Thumb flex/ext, abd/add, opposition, IP flexion     Modalities   Modalities Fluidotherapy     LUE Fluidotherapy   Number Minutes Fluidotherapy 15 Minutes   LUE Fluidotherapy Location Hand;Wrist;Forearm   Comments Patient unable to complete iontophoresis today due to skin rash - pain 6/10 after active motion exercises today     Manual Therapy   Manual Therapy Edema management   Edema Management edema massage to forearm to address pocket of swelling on dorsal aspect of left forearm                OT Education - 11/15/16 0930    Education provided Yes  Education Details reviewed HEP, Discussed options for pain control (heat / ice) , provided tensogrip sleeve to wear under wrist brace.            OT Short Term Goals - 11/15/16 0934      OT SHORT TERM GOAL #1   Title Independent with initial A/ROM HEP    Status On-going     OT SHORT TERM GOAL #2   Title Pain to be 4/10 or under with light functional tasks and A/ROM ex's   Status On-going     OT SHORT TERM GOAL #3   Title Pt to verbalize understanding with task modifications, compensatory strategies and A/E needs to reduce pain   Status Achieved     OT SHORT TERM GOAL #4   Title Pt will increase wrist flexion/ extension to 55* or greater for increased functional use.   Status On-going           OT Long Term Goals - 11/03/16 1629      OT LONG TERM GOAL #1   Title Independent with updated strengthening HEP   Time 8   Period Weeks   Status New   Target Date  01/02/17     OT LONG TERM GOAL #2   Title Improve grip strength Lt hand by 10 lbs or greater to open jars/containers   Baseline RUE 65 lbs, LUE 15 lbs   Time 8   Period Weeks   Status New     OT LONG TERM GOAL #3   Title Assess pinch strength and set goal PRN   Time 8   Period Weeks   Status New     OT LONG TERM GOAL #4   Title Lt thumb MP flexion to increase to 50* or greater for functional tasks   Time 8   Period Weeks   Status New     OT LONG TERM GOAL #5   Title Pt will report ability to perform work activities with wrist and thumb pain no greater than 3/10.   Baseline Pain 5/10 currently   Time 8   Period Weeks   Status New               Plan - 11/15/16 0932    Clinical Impression Statement Patient continues to experience pain when exerting any force in left wrist hand, although active motion is slowly improving.  Patient is modifying daily activities to avoid conditions which provoke pain symptoms.   Rehab Potential Good   Current Impairments/barriers affecting progress: history of multiple injuries to left wrist   OT Frequency 2x / week   OT Duration 8 weeks   OT Treatment/Interventions Self-care/ADL training;Moist Heat;Fluidtherapy;DME and/or AE instruction;Splinting;Patient/family education;Therapeutic exercises;Ultrasound;Therapeutic exercise;Therapeutic activities;Passive range of motion;Iontophoresis;Cryotherapy;Electrical Stimulation;Parrafin;Energy conservation;Manual Therapy   Plan determine if ionto #3 can be administered (rash still present today), pain/ edema control, functional use of left wrist and hand   Consulted and Agree with Plan of Care Patient      Patient will benefit from skilled therapeutic intervention in order to improve the following deficits and impairments:  Decreased coordination, Decreased range of motion, Decreased strength, Pain, Impaired UE functional use, Decreased knowledge of use of DME, Decreased activity tolerance,  Increased edema, Decreased endurance, Impaired flexibility  Visit Diagnosis: Pain in joints of left hand  Pain in left wrist  Stiffness of left wrist, not elsewhere classified  Stiffness of left hand, not elsewhere classified    Problem List Patient Active Problem List   Diagnosis  Date Noted  . Muscle strain 09/19/2016  . Right foot injury, subsequent encounter 08/25/2016  . Low back pain 06/27/2016  . Left wrist injury, subsequent encounter 06/27/2016  . Pain of left thumb 10/07/2015  . 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 Strain of left thumb 02/11/2015  . Strain of right forearm 02/11/2015  . Right shoulder pain 12/31/2014  . Injury of right little finger 12/31/2014  . Episodic cluster headache, 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  . 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 Hypersomnia, recurrent 12/02/2014  . Migraine aura, persistent, intractable, with status migrainosus 12/02/2014  . Lower back injury 09/30/2014  . Bipolar I disorder, most recent episode depressed ([place])   . MDD (major depressive disorder), recurrent, severe, with psychosis ([place]) 09/23/2014  . Injury of left index finger 08/20/2014  . Right ankle sprain 06/01/2014  . Contusion, multiple sites 06/01/2014  . Strain of right gastrocnemius muscle 06/01/2014  . Phonophobia 05/04/2014  . Photophobia of both eyes 05/04/2014  . Emotionally unstable borderline personality disorder ([place]) 05/04/2014  . Nausea with vomiting 05/04/2014  . Mixed bipolar I disorder ([place])   . Bipolar I disorder, most recent episode mixed ([place]) 04/18/2014  . Bipolar affective disorder, depressed, mild ([place]) 04/12/2014  . Suicidal ideation 04/12/2014  . Injury of right shoulder and upper arm 02/17/2014  . Left leg pain 06/03/2013  . Right hip pain 06/03/2013  . Migraine with status migrainosus 01/08/2013  . Personality disorder ([place])   . Chronic migraine 05/08/2012   . Contact dermatitis 11/27/2011  . Major depressive disorder, recurrent episode ([place]) 10/27/2011  . Generalized anxiety disorder 10/27/2011  . 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 Borderline personality disorder ([place]) 10/27/2011  . Right foot pain 09/28/2011  . 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 Ganglion cyst 09/29/2009  . PES PLANUS 07/01/2008  . BIPOLAR DISORDER UNSPECIFIED 06/09/2008    Mariah Milling, OTR/L 11/15/2016, 9:47 AM  [facility] [address] [place], [place], [address] Phone: [phone]   Fax:  [phone]  Name: [name] MRN: [id number] Date of Birth: [birth year]

## 2016-11-17 ENCOUNTER — Encounter: Payer: Self-pay | Admitting: Occupational Therapy

## 2016-11-17 DIAGNOSIS — L258 Unspecified contact dermatitis due to other agents: Secondary | ICD-10-CM | POA: Diagnosis not present

## 2016-11-21 ENCOUNTER — Ambulatory Visit: Payer: PPO | Admitting: Occupational Therapy

## 2016-11-21 ENCOUNTER — Other Ambulatory Visit: Payer: Self-pay | Admitting: Family Medicine

## 2016-11-22 ENCOUNTER — Telehealth: Payer: Self-pay | Admitting: Family Medicine

## 2016-11-22 NOTE — Telephone Encounter (Signed)
No, the plan was to get an MRI of her foot since it's not improving as expected.

## 2016-11-22 NOTE — Telephone Encounter (Signed)
Patient was informed. Currently working on getting MRI authorized and scheduled

## 2016-11-22 NOTE — Telephone Encounter (Signed)
Patient called wanting to know if she was supposed to go to physical therapy for her right foot injury.  I told patient that I did not see anything in the notes, but she remembers being informed that was the next step

## 2016-11-23 ENCOUNTER — Ambulatory Visit: Payer: PPO | Admitting: Occupational Therapy

## 2016-11-23 DIAGNOSIS — M25532 Pain in left wrist: Secondary | ICD-10-CM

## 2016-11-23 DIAGNOSIS — M25632 Stiffness of left wrist, not elsewhere classified: Secondary | ICD-10-CM

## 2016-11-23 DIAGNOSIS — M6281 Muscle weakness (generalized): Secondary | ICD-10-CM

## 2016-11-23 DIAGNOSIS — R29898 Other symptoms and signs involving the musculoskeletal system: Secondary | ICD-10-CM

## 2016-11-23 DIAGNOSIS — M25542 Pain in joints of left hand: Secondary | ICD-10-CM

## 2016-11-23 DIAGNOSIS — M25642 Stiffness of left hand, not elsewhere classified: Secondary | ICD-10-CM

## 2016-11-23 NOTE — Therapy (Signed)
[facility] [address] [place], [place], [address] Phone: [phone]   Fax:  [phone]  Occupational Therapy Treatment  Patient Details  Name: [name] MRN: [id number] Date of Birth: 29-Apr-1970 Referring Provider: Dr. Barbaraann Barthel  Encounter Date: 11/23/2016      OT End of Session - 11/23/16 0930    Visit Number 5   Number of Visits 16   Date for OT Re-Evaluation 01/02/17   Authorization Type [employer]   Authorization Time Period G-code every 10th visit   Authorization - Visit Number 5   Authorization - Number of Visits 10   OT Start Time 0848   OT Stop Time 0939   OT Time Calculation (min) 51 min   Activity Tolerance Patient tolerated treatment well      Past Medical History:  Diagnosis Date  . Achilles tendinitis   . Achilles tendinitis   . ADHD (attention deficit hyperactivity disorder)   . Allergy   . Arthritis   . Bipolar affective ([place])   . Bipolar disorder ([place])   . Cataracts, bilateral   . Eczema   . Ganglion cyst 09/29/2009   left wrist (2 cyst)  . Hyperprolactinemia ([place])   . Hypertension   . Lipoma   . Migraine   . Personality disorder ([place])   . Pes planus     Past Surgical History:  Procedure Laterality Date  . ANKLE SURGERY  12/88   left   . chest nodule  1990?   rt chest wall nodule removal  . GANGLION CYST EXCISION  2011  . lipoma removal    . right bunioectomy    . SHOULDER SURGERY  01/13/2011   right, partial tear  . tumor resection left thigh      There were no vitals filed for this visit.      Subjective Assessment - 11/23/16 0851    Subjective  Pt reports thumb and wrist pain   Pertinent History wrist and thumb pain, hx of extensor tendonitis, bipolar d/o, ADHD, borderline personality hx of [facility] admissions, (currently receiving outpatient tx), RTC tear, gaglion cyst left wrist   Patient Stated Goals improve LUE pain and functional use   Currently in Pain?  Yes   Pain Score 6    Pain Location Wrist  thumb   Pain Orientation Left   Pain Descriptors / Indicators Aching         Treatment:Treatment:  ADLs  ADL Comments Pt was instructed to call MD if rash worsens.    Exercises  Exercises Wrist;Hand    Wrist Exercises  Other wrist exercises Reviewed wrist flex/ext HEP    Hand Exercises  Other Hand Exercises Thumb flex/ext ext isolated    Modalities  Modalities Iontophoresis, Ultrasound  Ultrasound 3.3 mhz, 0.8 w/cm 2, 20% x 8 mins prior to iontophoresis, no adverse reactions.    Iontophoresis  Type of Iontophoresis Dexamethasone  Location Radial wrist  Dose 1.5 cc, 1.0 then slowly increased to  1.8 mA  Time x 23 min. running time, mild redness at medication and ground pad sites, no blisters present, lotion applied                         OT Short Term Goals - 11/15/16 0934      OT SHORT TERM GOAL #1   Title Independent with initial A/ROM HEP    Status On-going     OT SHORT TERM GOAL #2  Title Pain to be 4/10 or under with light functional tasks and A/ROM ex's   Status On-going     OT SHORT TERM GOAL #3   Title Pt to verbalize understanding with task modifications, compensatory strategies and A/E needs to reduce pain   Status Achieved     OT SHORT TERM GOAL #4   Title Pt will increase wrist flexion/ extension to 55* or greater for increased functional use.   Status On-going           OT Long Term Goals - 11/03/16 1629      OT LONG TERM GOAL #1   Title Independent with updated strengthening HEP   Time 8   Period Weeks   Status New   Target Date 01/02/17     OT LONG TERM GOAL #2   Title Improve grip strength Lt hand by 10 lbs or greater to open jars/containers   Baseline RUE 65 lbs, LUE 15 lbs   Time 8   Period Weeks   Status New     OT LONG TERM GOAL #3   Title Assess pinch strength and set goal PRN   Time 8   Period Weeks   Status New     OT LONG TERM GOAL #4   Title Lt thumb  MP flexion to increase to 50* or greater for functional tasks   Time 8   Period Weeks   Status New     OT LONG TERM GOAL #5   Title Pt will report ability to perform work activities with wrist and thumb pain no greater than 3/10.   Baseline Pain 5/10 currently   Time 8   Period Weeks   Status New               Plan - 11/23/16 0936    Clinical Impression Statement Pt continues to report wrist and thumb pain with daily activities.   Rehab Potential Good   Current Impairments/barriers affecting progress: history of multiple injuries to left wrist   OT Frequency 2x / week   OT Duration 8 weeks   OT Treatment/Interventions Self-care/ADL training;Moist Heat;Fluidtherapy;DME and/or AE instruction;Splinting;Patient/family education;Therapeutic exercises;Ultrasound;Therapeutic exercise;Therapeutic activities;Passive range of motion;Iontophoresis;Cryotherapy;Electrical Stimulation;Parrafin;Energy conservation;Manual Therapy   Plan continue ionto# 4, pain, edema control.   Consulted and Agree with Plan of Care Patient      Patient will benefit from skilled therapeutic intervention in order to improve the following deficits and impairments:  Decreased coordination, Decreased range of motion, Decreased strength, Pain, Impaired UE functional use, Decreased knowledge of use of DME, Decreased activity tolerance, Increased edema, Decreased endurance, Impaired flexibility  Visit Diagnosis: Muscle weakness (generalized)  Other symptoms and signs involving the musculoskeletal system  Stiffness of left hand, not elsewhere classified  Stiffness of left wrist, not elsewhere classified  Pain in left wrist  Pain in joints of left hand    Problem List Patient Active Problem List   Diagnosis Date Noted  . Muscle strain 09/19/2016  . Right foot injury, subsequent encounter 08/25/2016  . Low back pain 06/27/2016  . Left wrist injury, subsequent encounter 06/27/2016  . Pain of left thumb  10/07/2015  . 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 Strain of left thumb 02/11/2015  . Strain of right forearm 02/11/2015  . Right shoulder pain 12/31/2014  . Injury of right little finger 12/31/2014  . Episodic cluster headache, 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  . 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  Hypersomnia, recurrent 12/02/2014  . Migraine aura, persistent, intractable, with status migrainosus 12/02/2014  . Lower back injury 09/30/2014  . Bipolar I disorder, most recent episode depressed ([place])   . MDD (major depressive disorder), recurrent, severe, with psychosis ([place]) 09/23/2014  . Injury of left index finger 08/20/2014  . Right ankle sprain 06/01/2014  . Contusion, multiple sites 06/01/2014  . Strain of right gastrocnemius muscle 06/01/2014  . Phonophobia 05/04/2014  . Photophobia of both eyes 05/04/2014  . Emotionally unstable borderline personality disorder ([place]) 05/04/2014  . Nausea with vomiting 05/04/2014  . Mixed bipolar I disorder ([place])   . Bipolar I disorder, most recent episode mixed ([place]) 04/18/2014  . Bipolar affective disorder, depressed, mild ([place]) 04/12/2014  . Suicidal ideation 04/12/2014  . Injury of right shoulder and upper arm 02/17/2014  . Left leg pain 06/03/2013  . Right hip pain 06/03/2013  . Migraine with status migrainosus 01/08/2013  . Personality disorder ([place])   . Chronic migraine 05/08/2012  . Contact dermatitis 11/27/2011  . Major depressive disorder, recurrent episode ([place]) 10/27/2011  . Generalized anxiety disorder 10/27/2011  . 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 Borderline personality disorder ([place]) 10/27/2011  . Right foot pain 09/28/2011  . 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 Ganglion cyst 09/29/2009  . PES PLANUS 07/01/2008  . BIPOLAR DISORDER UNSPECIFIED 06/09/2008     [name] 11/23/2016, 9:38 AM  [facility] [address] [place], [place], [address] Phone: [phone]   Fax:  [phone]  Name: [name] MRN: [id number] Date of Birth: [birth year]

## 2016-11-27 ENCOUNTER — Other Ambulatory Visit: Payer: Self-pay | Admitting: Neurology

## 2016-11-28 ENCOUNTER — Ambulatory Visit: Payer: PPO | Admitting: Occupational Therapy

## 2016-12-01 ENCOUNTER — Ambulatory Visit: Payer: PPO | Attending: Family Medicine | Admitting: Occupational Therapy

## 2016-12-01 DIAGNOSIS — M6281 Muscle weakness (generalized): Secondary | ICD-10-CM | POA: Diagnosis not present

## 2016-12-01 DIAGNOSIS — M25542 Pain in joints of left hand: Secondary | ICD-10-CM | POA: Insufficient documentation

## 2016-12-01 DIAGNOSIS — M25532 Pain in left wrist: Secondary | ICD-10-CM | POA: Diagnosis not present

## 2016-12-01 DIAGNOSIS — M25632 Stiffness of left wrist, not elsewhere classified: Secondary | ICD-10-CM | POA: Insufficient documentation

## 2016-12-01 DIAGNOSIS — R29898 Other symptoms and signs involving the musculoskeletal system: Secondary | ICD-10-CM

## 2016-12-01 DIAGNOSIS — M25642 Stiffness of left hand, not elsewhere classified: Secondary | ICD-10-CM | POA: Diagnosis not present

## 2016-12-01 NOTE — Therapy (Signed)
[facility] [address] [place], [place], [address] Phone: [phone]   Fax:  [phone]  Occupational Therapy Treatment  Patient Details  Name: [name] MRN: [id number] Date of Birth: [birth year] Referring Provider: Dr. Barbaraann Barthel  Encounter Date: 12/01/2016      OT End of Session - 12/01/16 1003    Visit Number 6   Number of Visits 16   Date for OT Re-Evaluation 01/02/17   Authorization Type [employer]   Authorization Time Period G-code every 10th visit   Authorization - Visit Number 6   Authorization - Number of Visits 10   OT Start Time 0935   OT Stop Time 1019   OT Time Calculation (min) 44 min   Activity Tolerance Patient tolerated treatment well   Behavior During Therapy [facility] for tasks assessed/performed      Past Medical History:  Diagnosis Date  . Achilles tendinitis   . Achilles tendinitis   . ADHD (attention deficit hyperactivity disorder)   . Allergy   . Arthritis   . Bipolar affective ([place])   . Bipolar disorder ([place])   . Cataracts, bilateral   . Eczema   . Ganglion cyst 09/29/2009   left wrist (2 cyst)  . Hyperprolactinemia ([place])   . Hypertension   . Lipoma   . Migraine   . Personality disorder ([place])   . Pes planus     Past Surgical History:  Procedure Laterality Date  . ANKLE SURGERY  12/88   left   . chest nodule  1990?   rt chest wall nodule removal  . GANGLION CYST EXCISION  2011  . lipoma removal    . right bunioectomy    . SHOULDER SURGERY  01/13/2011   right, partial tear  . tumor resection left thigh      There were no vitals filed for this visit.      Subjective Assessment - 12/01/16 1002    Pertinent History wrist and thumb pain, hx of extensor tendonitis, bipolar d/o, ADHD, borderline personality hx of [facility] admissions, (currently receiving outpatient tx), RTC tear, gaglion cyst left wrist   Patient Stated Goals improve LUE pain and functional use   Currently in Pain? Yes   Pain Score 5    Pain Location Wrist   Pain Orientation Left   Pain Descriptors / Indicators Aching   Pain Type Chronic pain   Pain Onset More than a month ago   Pain Frequency Intermittent   Aggravating Factors  use   Pain Relieving Factors rest   Effect of Pain on Daily Activities limits functional use   Multiple Pain Sites No            Treatment: Exercises  Exercises Wrist;Hand    Wrist Exercises  Other wrist exercises Reviewed wrist flex/ext HEP    Hand Exercises  Other Hand Exercises Thumb flex/ext ext isolated    Modalities  Modalities Iontophoresis, Ultrasound  Ultrasound 3.3 mhz, 0.8 w/cm 2, 20% x 8 mins prior to iontophoresis, no adverse reactions.    Iontophoresis  Type of Iontophoresis Dexamethasone  Location Radial wrist  Dose 1.5 cc, 1.0 then slowly increased to  1.8 mA  Time x 23 min. running time, mild redness at medication and ground pad sites, no blisters present, lotion applied                       OT Short Term Goals - 11/15/16 8341  OT SHORT TERM GOAL #1   Title Independent with initial A/ROM HEP    Status On-going     OT SHORT TERM GOAL #2   Title Pain to be 4/10 or under with light functional tasks and A/ROM ex's   Status On-going     OT SHORT TERM GOAL #3   Title Pt to verbalize understanding with task modifications, compensatory strategies and A/E needs to reduce pain   Status Achieved     OT SHORT TERM GOAL #4   Title Pt will increase wrist flexion/ extension to 55* or greater for increased functional use.   Status On-going           OT Long Term Goals - 11/03/16 1629      OT LONG TERM GOAL #1   Title Independent with updated strengthening HEP   Time 8   Period Weeks   Status New   Target Date 01/02/17     OT LONG TERM GOAL #2   Title Improve grip strength Lt hand by 10 lbs or greater to open jars/containers   Baseline RUE 65 lbs, LUE 15 lbs   Time 8   Period Weeks    Status New     OT LONG TERM GOAL #3   Title Assess pinch strength and set goal PRN   Time 8   Period Weeks   Status New     OT LONG TERM GOAL #4   Title Lt thumb MP flexion to increase to 50* or greater for functional tasks   Time 8   Period Weeks   Status New     OT LONG TERM GOAL #5   Title Pt will report ability to perform work activities with wrist and thumb pain no greater than 3/10.   Baseline Pain 5/10 currently   Time 8   Period Weeks   Status New               Plan - 12/01/16 1003    Clinical Impression Statement Pt reports she missed last week due to having the flu. Pt reports that she forgot to wear her brace to work yesterday and now her wrist hurts.   Rehab Potential Good   Current Impairments/barriers affecting progress: history of multiple injuries to left wrist   OT Frequency 2x / week   OT Duration 8 weeks   OT Treatment/Interventions Self-care/ADL training;Moist Heat;Fluidtherapy;DME and/or AE instruction;Splinting;Patient/family education;Therapeutic exercises;Ultrasound;Therapeutic exercise;Therapeutic activities;Passive range of motion;Iontophoresis;Cryotherapy;Electrical Stimulation;Parrafin;Energy conservation;Manual Therapy   Plan continue ionto#5, gentle exercise   Consulted and Agree with Plan of Care Patient      Patient will benefit from skilled therapeutic intervention in order to improve the following deficits and impairments:  Decreased coordination, Decreased range of motion, Decreased strength, Pain, Impaired UE functional use, Decreased knowledge of use of DME, Decreased activity tolerance, Increased edema, Decreased endurance, Impaired flexibility  Visit Diagnosis: Muscle weakness (generalized)  Other symptoms and signs involving the musculoskeletal system  Stiffness of left hand, not elsewhere classified  Stiffness of left wrist, not elsewhere classified  Pain in left wrist    Problem List Patient Active Problem List    Diagnosis Date Noted  . Muscle strain 09/19/2016  . Right foot injury, subsequent encounter 08/25/2016  . Low back pain 06/27/2016  . Left wrist injury, subsequent encounter 06/27/2016  . Pain of left thumb 10/07/2015  . 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 Strain of left thumb 02/11/2015  . Strain of right forearm 02/11/2015  .  Right shoulder pain 12/31/2014  . Injury of right little finger 12/31/2014  . Episodic cluster headache, 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  . 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 Hypersomnia, recurrent 12/02/2014  . Migraine aura, persistent, intractable, with status migrainosus 12/02/2014  . Lower back injury 09/30/2014  . Bipolar I disorder, most recent episode depressed ([place])   . MDD (major depressive disorder), recurrent, severe, with psychosis ([place]) 09/23/2014  . Injury of left index finger 08/20/2014  . Right ankle sprain 06/01/2014  . Contusion, multiple sites 06/01/2014  . Strain of right gastrocnemius muscle 06/01/2014  . Phonophobia 05/04/2014  . Photophobia of both eyes 05/04/2014  . Emotionally unstable borderline personality disorder ([place]) 05/04/2014  . Nausea with vomiting 05/04/2014  . Mixed bipolar I disorder ([place])   . Bipolar I disorder, most recent episode mixed ([place]) 04/18/2014  . Bipolar affective disorder, depressed, mild ([place]) 04/12/2014  . Suicidal ideation 04/12/2014  . Injury of right shoulder and upper arm 02/17/2014  . Left leg pain 06/03/2013  . Right hip pain 06/03/2013  . Migraine with status migrainosus 01/08/2013  . Personality disorder ([place])   . Chronic migraine 05/08/2012  . Contact dermatitis 11/27/2011  . Major depressive disorder, recurrent episode ([place]) 10/27/2011  . Generalized anxiety disorder 10/27/2011  . 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 Borderline personality disorder ([place]) 10/27/2011  . Right  foot pain 09/28/2011  . 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 Ganglion cyst 09/29/2009  . PES PLANUS 07/01/2008  . BIPOLAR DISORDER UNSPECIFIED 06/09/2008    RINE,KATHRYN 12/01/2016, 10:06 AM  [facility] [address] [place], [place], [address] Phone: [phone]   Fax:  [phone]  Name: [name] MRN: [id number] Date of Birth: 09/02/1970

## 2016-12-04 ENCOUNTER — Encounter: Payer: Self-pay | Admitting: Family Medicine

## 2016-12-04 ENCOUNTER — Ambulatory Visit: Payer: PPO | Admitting: Family Medicine

## 2016-12-04 DIAGNOSIS — S6992XD Unspecified injury of left wrist, hand and finger(s), subsequent encounter: Secondary | ICD-10-CM | POA: Diagnosis not present

## 2016-12-04 DIAGNOSIS — M79671 Pain in right foot: Secondary | ICD-10-CM | POA: Diagnosis not present

## 2016-12-04 NOTE — Patient Instructions (Signed)
I will call you with the results of the foot MRI and how to proceed.  For your wrist/forearm continue with the brace, occupational therapy. Take the diclofenac only if needed. Consider surgical referral for these fibromas only if pain persists - I expect them to stabilize and the pain to resolve though. Follow up with me in 6 weeks for this issue.

## 2016-12-05 ENCOUNTER — Ambulatory Visit: Payer: Self-pay | Admitting: Family Medicine

## 2016-12-05 ENCOUNTER — Ambulatory Visit: Payer: PPO | Admitting: Occupational Therapy

## 2016-12-05 DIAGNOSIS — R29898 Other symptoms and signs involving the musculoskeletal system: Secondary | ICD-10-CM

## 2016-12-05 DIAGNOSIS — M25632 Stiffness of left wrist, not elsewhere classified: Secondary | ICD-10-CM

## 2016-12-05 DIAGNOSIS — M25532 Pain in left wrist: Secondary | ICD-10-CM

## 2016-12-05 DIAGNOSIS — M6281 Muscle weakness (generalized): Secondary | ICD-10-CM

## 2016-12-05 DIAGNOSIS — M25642 Stiffness of left hand, not elsewhere classified: Secondary | ICD-10-CM

## 2016-12-05 NOTE — Therapy (Signed)
[facility] [address] [place], [place], [address] Phone: [phone]   Fax:  [phone]  Occupational Therapy Treatment  Patient Details  Name: [name] MRN: [id number] Date of Birth: 1970/06/30 Referring Provider: Dr. Barbaraann Barthel   Encounter Date: 12/05/2016  OT End of Session - 12/05/16 1008    Visit Number  7    Number of Visits  16    Date for OT Re-Evaluation  01/02/17    Authorization Type  [employer]    Authorization Time Period  G-code every 10th visit    Authorization - Visit Number  7    Authorization - Number of Visits  10    OT Start Time  0935    OT Stop Time  1015    OT Time Calculation (min)  40 min    Equipment Utilized During Treatment  fluidotherapy       Past Medical History:  Diagnosis Date  . Achilles tendinitis   . Achilles tendinitis   . ADHD (attention deficit hyperactivity disorder)   . Allergy   . Arthritis   . Bipolar affective ([place])   . Bipolar disorder ([place])   . Cataracts, bilateral   . Eczema   . Ganglion cyst 09/29/2009   left wrist (2 cyst)  . Hyperprolactinemia ([place])   . Hypertension   . Lipoma   . Migraine   . Personality disorder ([place])   . Pes planus     Past Surgical History:  Procedure Laterality Date  . ANKLE SURGERY  12/88   left   . chest nodule  1990?   rt chest wall nodule removal  . GANGLION CYST EXCISION  2011  . lipoma removal    . right bunioectomy    . SHOULDER SURGERY  01/13/2011   right, partial tear  . tumor resection left thigh      There were no vitals filed for this visit.  Subjective Assessment - 12/05/16 1011    Subjective   Pt reports seeing Dr. Judene Companion yesterday    Pertinent History  wrist and thumb pain, hx of extensor tendonitis, bipolar d/o, ADHD, borderline personality hx of [facility] admissions, (currently receiving outpatient tx), RTC tear, gaglion cyst left wrist    Patient Stated Goals  improve LUE pain and functional use     Currently in Pain?  Yes    Pain Score  3     Pain Location  Wrist    Pain Orientation  Left    Pain Descriptors / Indicators  Aching    Pain Type  Chronic pain    Pain Frequency  Intermittent    Aggravating Factors   use    Pain Relieving Factors  rest    Effect of Pain on Daily Activities  limits functional use              Treatment:Treatment: Exercises  Exercises Wrist;Hand    Wrist Exercises  Other wrist exercises Reviewed wrist flex/ext HEP    Hand Exercises  Other Hand Exercises Thumb flex/ext ext isolated    Modalities  Modalities Iontophoresis, Ultrasound  Ultrasound 3.3 mhz, 0.8 w/cm 2, 20% x 8 mins prior to iontophoresis to wrist and forearm, no adverse reactions.    Iontophoresis  Type of Iontophoresis Dexamethasone  Location Radial wrist  Dose 2.0 cc, 1.8 then slowly increased to  2.0 mA  Time x 21 min. running time, mild redness at medication and ground pad sites, 1 blisters present, lotion applied  OT Short Term Goals - 11/15/16 0934      OT SHORT TERM GOAL #1   Title  Independent with initial A/ROM HEP     Status  On-going      OT SHORT TERM GOAL #2   Title  Pain to be 4/10 or under with light functional tasks and A/ROM ex's    Status  On-going      OT SHORT TERM GOAL #3   Title  Pt to verbalize understanding with task modifications, compensatory strategies and A/E needs to reduce pain    Status  Achieved      OT SHORT TERM GOAL #4   Title  Pt will increase wrist flexion/ extension to 55* or greater for increased functional use.    Status  On-going        OT Long Term Goals - 11/03/16 1629      OT LONG TERM GOAL #1   Title  Independent with updated strengthening HEP    Time  8    Period  Weeks    Status  New    Target Date  01/02/17      OT LONG TERM GOAL #2   Title  Improve grip strength Lt hand by 10 lbs or greater to open jars/containers    Baseline  RUE 65 lbs, LUE 15 lbs    Time  8     Period  Weeks    Status  New      OT LONG TERM GOAL #3   Title  Assess pinch strength and set goal PRN    Time  8    Period  Weeks    Status  New      OT LONG TERM GOAL #4   Title  Lt thumb MP flexion to increase to 50* or greater for functional tasks    Time  8    Period  Weeks    Status  New      OT LONG TERM GOAL #5   Title  Pt will report ability to perform work activities with wrist and thumb pain no greater than 3/10.    Baseline  Pain 5/10 currently    Time  8    Period  Weeks    Status  New            Plan - 12/05/16 1008    Clinical Impression Statement  Pt is progressing slowly towards goals.Pt reports mild improvements in wrist pain    Rehab Potential  Good    Current Impairments/barriers affecting progress:  history of multiple injuries to left wrist    OT Frequency  2x / week    OT Duration  8 weeks    OT Treatment/Interventions  Self-care/ADL training;Moist Heat;Fluidtherapy;DME and/or AE instruction;Splinting;Patient/family education;Therapeutic exercises;Ultrasound;Therapeutic exercise;Therapeutic activities;Passive range of motion;Iontophoresis;Cryotherapy;Electrical Stimulation;Parrafin;Energy conservation;Manual Therapy    Plan  continue ionto #6, gentle exercise    Consulted and Agree with Plan of Care  Patient       Patient will benefit from skilled therapeutic intervention in order to improve the following deficits and impairments:  Decreased coordination, Decreased range of motion, Decreased strength, Pain, Impaired UE functional use, Decreased knowledge of use of DME, Decreased activity tolerance, Increased edema, Decreased endurance, Impaired flexibility  Visit Diagnosis: Muscle weakness (generalized)  Other symptoms and signs involving the musculoskeletal system  Stiffness of left hand, not elsewhere classified  Stiffness of left wrist, not elsewhere classified  Pain in left wrist    Problem List  Patient Active Problem List    Diagnosis Date Noted  . Muscle strain 09/19/2016  . Right foot injury, subsequent encounter 08/25/2016  . Low back pain 06/27/2016  . Left wrist injury, subsequent encounter 06/27/2016  . Pain of left thumb 10/07/2015  . 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 Strain of left thumb 02/11/2015  . Strain of right forearm 02/11/2015  . Right shoulder pain 12/31/2014  . Injury of right little finger 12/31/2014  . Episodic cluster headache, 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  . 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 Hypersomnia, recurrent 12/02/2014  . Migraine aura, persistent, intractable, with status migrainosus 12/02/2014  . Lower back injury 09/30/2014  . Bipolar I disorder, most recent episode depressed ([place])   . MDD (major depressive disorder), recurrent, severe, with psychosis ([place]) 09/23/2014  . Injury of left index finger 08/20/2014  . Right ankle sprain 06/01/2014  . Contusion, multiple sites 06/01/2014  . Strain of right gastrocnemius muscle 06/01/2014  . Phonophobia 05/04/2014  . Photophobia of both eyes 05/04/2014  . Emotionally unstable borderline personality disorder ([place]) 05/04/2014  . Nausea with vomiting 05/04/2014  . Mixed bipolar I disorder ([place])   . Bipolar I disorder, most recent episode mixed ([place]) 04/18/2014  . Bipolar affective disorder, depressed, mild ([place]) 04/12/2014  . Suicidal ideation 04/12/2014  . Injury of right shoulder and upper arm 02/17/2014  . Left leg pain 06/03/2013  . Right hip pain 06/03/2013  . Migraine with status migrainosus 01/08/2013  . Personality disorder ([place])   . Chronic migraine 05/08/2012  . Contact dermatitis 11/27/2011  . Major depressive disorder, recurrent episode ([place]) 10/27/2011  . Generalized anxiety disorder 10/27/2011  . 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 Borderline personality disorder ([place]) 10/27/2011  . Right  foot pain 09/28/2011  . 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 Ganglion cyst 09/29/2009  . PES PLANUS 07/01/2008  . BIPOLAR DISORDER UNSPECIFIED 06/09/2008    RINE,KATHRYN 12/05/2016, 10:13 AM  [facility] [address] [place], [place], [address] Phone: [phone]   Fax:  [phone]  Name: [name] MRN: [id number] Date of Birth: 04/19/1970

## 2016-12-08 ENCOUNTER — Ambulatory Visit: Payer: PPO | Admitting: Occupational Therapy

## 2016-12-11 ENCOUNTER — Encounter: Payer: Self-pay | Admitting: Family Medicine

## 2016-12-11 NOTE — Assessment & Plan Note (Signed)
Radiographs were negative.  Only just started OT with ionto - will continue with this and bracing.  Diclofenac if needed.  Consider general surgeon or hand surgeon referral for fibromas if pain persists - discussed expect pain to resolve as these stabilize.  F/u in 6 weeks.
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
## 2016-12-11 NOTE — Assessment & Plan Note (Signed)
Awaiting approval for MRI.  Continue inserts, diclofenac, ice/heat in meantime.

## 2016-12-12 ENCOUNTER — Ambulatory Visit: Payer: PPO | Admitting: Occupational Therapy

## 2016-12-12 DIAGNOSIS — M25542 Pain in joints of left hand: Secondary | ICD-10-CM

## 2016-12-12 DIAGNOSIS — M25532 Pain in left wrist: Secondary | ICD-10-CM

## 2016-12-12 DIAGNOSIS — M6281 Muscle weakness (generalized): Secondary | ICD-10-CM

## 2016-12-12 DIAGNOSIS — M25642 Stiffness of left hand, not elsewhere classified: Secondary | ICD-10-CM

## 2016-12-12 DIAGNOSIS — R29898 Other symptoms and signs involving the musculoskeletal system: Secondary | ICD-10-CM

## 2016-12-12 DIAGNOSIS — M25632 Stiffness of left wrist, not elsewhere classified: Secondary | ICD-10-CM

## 2016-12-12 NOTE — Therapy (Signed)
[facility] [address] [place], [place], [address] Phone: [phone]   Fax:  [phone]  Occupational Therapy Treatment  Patient Details  Name: [name] MRN: [id number] Date of Birth: 23-Nov-1970 Referring Provider: Dr. Barbaraann Barthel   Encounter Date: 12/12/2016  OT End of Session - 12/12/16 1604    Visit Number  8    Number of Visits  16    Date for OT Re-Evaluation  01/02/17    Authorization Type  [employer]    Authorization Time Period  G-code every 10th visit    Authorization - Visit Number  8    Authorization - Number of Visits  10    OT Start Time  3419    OT Stop Time  1615    OT Time Calculation (min)  34 min    Activity Tolerance  Patient tolerated treatment well    Behavior During Therapy  [facility] for tasks assessed/performed       Past Medical History:  Diagnosis Date  . Achilles tendinitis   . Achilles tendinitis   . ADHD (attention deficit hyperactivity disorder)   . Allergy   . Arthritis   . Bipolar affective ([place])   . Bipolar disorder ([place])   . Cataracts, bilateral   . Eczema   . Ganglion cyst 09/29/2009   left wrist (2 cyst)  . Hyperprolactinemia ([place])   . Hypertension   . Lipoma   . Migraine   . Personality disorder ([place])   . Pes planus     Past Surgical History:  Procedure Laterality Date  . ANKLE SURGERY  12/88   left   . chest nodule  1990?   rt chest wall nodule removal  . GANGLION CYST EXCISION  2011  . lipoma removal    . right bunioectomy    . SHOULDER SURGERY  01/13/2011   right, partial tear  . tumor resection left thigh      There were no vitals filed for this visit.  Subjective Assessment - 12/12/16 1606    Subjective   Pt reports feeling down, she saw her therapist earlier today    Pertinent History  wrist and thumb pain, hx of extensor tendonitis, bipolar d/o, ADHD, borderline personality hx of [facility] admissions, (currently receiving outpatient tx), RTC tear,  gaglion cyst left wrist    Patient Stated Goals  improve LUE pain and functional use    Currently in Pain?  Yes    Pain Score  -- 4.5    Pain Location  Wrist    Pain Orientation  Left    Pain Descriptors / Indicators  Aching    Pain Type  Chronic pain    Pain Onset  More than a month ago    Aggravating Factors   use    Pain Relieving Factors  rest    Effect of Pain on Daily Activities  limits functional use    Multiple Pain Sites  No                    Treatment: Suicide risk assessment completed and it was negative. Pt does not have plans to harm herself at this time. Pt was instructed to contact her therapist immediately or to go to behavioral health if she has these thoughts. Pt verbalized understanding.  Pt has mild rash from previous ionto, therapist applied at a different site today, and administered on a lower setting. Exercises  Exercises Wrist;Hand    Wrist Exercises  Other wrist exercises Reviewed wrist flex/ext HEP    Hand Exercises  Other Hand Exercises Thumb flex/ext ext isolated    Modalities  Modalities Iontophoresis,    Iontophoresis  Type of Iontophoresis Dexamethasone  Location Radial wrist  Dose 1.5 cc, 1.8 mA  Time x 61min. running time, mild redness at medication and ground pad sites, 1 blisters present, lotion applied            OT Short Term Goals - 12/05/16 1015      OT SHORT TERM GOAL #1   Title  Independent with initial A/ROM HEP     Status  Achieved      OT SHORT TERM GOAL #2   Title  Pain to be 4/10 or under with light functional tasks and A/ROM ex's    Status  Achieved      OT SHORT TERM GOAL #3   Title  Pt to verbalize understanding with task modifications, compensatory strategies and A/E needs to reduce pain    Status  Achieved      OT SHORT TERM GOAL #4   Title  Pt will increase wrist flexion/ extension to 55* or greater for increased functional use.    Status  On-going        OT Long Term Goals - 11/03/16  1629      OT LONG TERM GOAL #1   Title  Independent with updated strengthening HEP    Time  8    Period  Weeks    Status  New    Target Date  01/02/17      OT LONG TERM GOAL #2   Title  Improve grip strength Lt hand by 10 lbs or greater to open jars/containers    Baseline  RUE 65 lbs, LUE 15 lbs    Time  8    Period  Weeks    Status  New      OT LONG TERM GOAL #3   Title  Assess pinch strength and set goal PRN    Time  8    Period  Weeks    Status  New      OT LONG TERM GOAL #4   Title  Lt thumb MP flexion to increase to 50* or greater for functional tasks    Time  8    Period  Weeks    Status  New      OT LONG TERM GOAL #5   Title  Pt will report ability to perform work activities with wrist and thumb pain no greater than 3/10.    Baseline  Pain 5/10 currently    Time  8    Period  Weeks    Status  New              Patient will benefit from skilled therapeutic intervention in order to improve the following deficits and impairments:     Visit Diagnosis: Muscle weakness (generalized)  Other symptoms and signs involving the musculoskeletal system  Stiffness of left hand, not elsewhere classified  Stiffness of left wrist, not elsewhere classified  Pain in left wrist  Pain in joints of left hand    Problem List Patient Active Problem List   Diagnosis Date Noted  . Muscle strain 09/19/2016  . Right foot injury, subsequent encounter 08/25/2016  . Low back pain 06/27/2016  . Left wrist injury, subsequent encounter 06/27/2016  . Pain of left thumb 10/07/2015  . 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  Strain of left thumb 02/11/2015  . Strain of right forearm 02/11/2015  . Right shoulder pain 12/31/2014  . Injury of right little finger 12/31/2014  . Episodic cluster headache, 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  . 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 Hypersomnia, recurrent 12/02/2014  .  Migraine aura, persistent, intractable, with status migrainosus 12/02/2014  . Lower back injury 09/30/2014  . Bipolar I disorder, most recent episode depressed ([place])   . MDD (major depressive disorder), recurrent, severe, with psychosis ([place]) 09/23/2014  . Injury of left index finger 08/20/2014  . Right ankle sprain 06/01/2014  . Contusion, multiple sites 06/01/2014  . Strain of right gastrocnemius muscle 06/01/2014  . Phonophobia 05/04/2014  . Photophobia of both eyes 05/04/2014  . Emotionally unstable borderline personality disorder ([place]) 05/04/2014  . Nausea with vomiting 05/04/2014  . Mixed bipolar I disorder ([place])   . Bipolar I disorder, most recent episode mixed ([place]) 04/18/2014  . Bipolar affective disorder, depressed, mild ([place]) 04/12/2014  . Suicidal ideation 04/12/2014  . Injury of right shoulder and upper arm 02/17/2014  . Left leg pain 06/03/2013  . Right hip pain 06/03/2013  . Migraine with status migrainosus 01/08/2013  . Personality disorder ([place])   . Chronic migraine 05/08/2012  . Contact dermatitis 11/27/2011  . Major depressive disorder, recurrent episode ([place]) 10/27/2011  . Generalized anxiety disorder 10/27/2011  . 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 Borderline personality disorder ([place]) 10/27/2011  . Right foot pain 09/28/2011  . 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 Ganglion cyst 09/29/2009  . PES PLANUS 07/01/2008  . BIPOLAR DISORDER UNSPECIFIED 06/09/2008    [name] 12/12/2016, 4:08 PM  [facility] [address] [place], [place], [address] Phone: [phone]   Fax:  [phone]  Name: [name] MRN: [id number] Date of Birth: 1971-01-17

## 2016-12-13 ENCOUNTER — Ambulatory Visit: Payer: PPO | Admitting: Occupational Therapy

## 2016-12-13 DIAGNOSIS — M25532 Pain in left wrist: Secondary | ICD-10-CM

## 2016-12-13 DIAGNOSIS — M6281 Muscle weakness (generalized): Secondary | ICD-10-CM

## 2016-12-13 DIAGNOSIS — M25642 Stiffness of left hand, not elsewhere classified: Secondary | ICD-10-CM

## 2016-12-13 DIAGNOSIS — M25542 Pain in joints of left hand: Secondary | ICD-10-CM

## 2016-12-13 DIAGNOSIS — M25632 Stiffness of left wrist, not elsewhere classified: Secondary | ICD-10-CM

## 2016-12-13 NOTE — Therapy (Signed)
[facility] [address] [place], [place], [address] Phone: [phone]   Fax:  [phone]  Occupational Therapy Treatment  Patient Details  Name: [name] MRN: [id number] Date of Birth: Apr 26, 1970 Referring Provider: Dr. Barbaraann Barthel   Encounter Date: 12/13/2016  OT End of Session - 12/13/16 1304    OT Start Time  -- 2 units only, therapist called away       Past Medical History:  Diagnosis Date  . Achilles tendinitis   . Achilles tendinitis   . ADHD (attention deficit hyperactivity disorder)   . Allergy   . Arthritis   . Bipolar affective ([place])   . Bipolar disorder ([place])   . Cataracts, bilateral   . Eczema   . Ganglion cyst 09/29/2009   left wrist (2 cyst)  . Hyperprolactinemia ([place])   . Hypertension   . Lipoma   . Migraine   . Personality disorder ([place])   . Pes planus     Past Surgical History:  Procedure Laterality Date  . ANKLE SURGERY  12/88   left   . chest nodule  1990?   rt chest wall nodule removal  . GANGLION CYST EXCISION  2011  . lipoma removal    . right bunioectomy    . SHOULDER SURGERY  01/13/2011   right, partial tear  . tumor resection left thigh      There were no vitals filed for this visit.  Subjective Assessment - 12/13/16 1110    Pertinent History  wrist and thumb pain, hx of extensor tendonitis, bipolar d/o, ADHD, borderline personality hx of [facility] admissions, (currently receiving outpatient tx), RTC tear, gaglion cyst left wrist    Patient Stated Goals  improve LUE pain and functional use    Currently in Pain?  Yes    Pain Score  2     Pain Location  Wrist    Pain Orientation  Left    Pain Descriptors / Indicators  Aching    Pain Type  Chronic pain    Pain Onset  More than a month ago    Pain Frequency  Intermittent    Aggravating Factors   use    Pain Relieving Factors  rest     Effect of Pain on Daily Activities  limits functional use    Multiple Pain Sites   No             Treatment: Fluidotherapy x 11 mins for pain and stiffness. No adverse reactions(Pt has small amount of rash from ionto last week, no open areas) A/ROM wrist flexion / extension, and thumb flexion extension. A/ROM wrist flexion/ extension with 1 lbs weight in each direction 10-15 reps Fine motor coordination activities with LUE: manipulating coins and counting out ot place in container, flipping and dealing cards. Reassurance provided as pt reports continued depression without plans to harm herself. Pt reports her mother wants her to cx her appoint with her mental health therapist on Friday. Pt requested therapist speak with pt's mother on her behalf. OT call and spoke with pt's mother Hassan Rowan. Pt's mother was argumentativre and complained about pt wasting money, not helping around the house. she expressed that she feels it is a waste of time for pt to see a mental health  therapist. OT reinforced importance of pt meeting with her mental health therapist for assistance. Pt's mother finally said pt should do whatever she would like too. Therapist reinforced with pt importance of keeping her appointment with  her mental health counselor, pt reports she intends to.               OT Short Term Goals - 12/12/16 1619      OT SHORT TERM GOAL #1   Title  Independent with initial A/ROM HEP     Status  Achieved      OT SHORT TERM GOAL #2   Title  Pain to be 4/10 or under with light functional tasks and A/ROM ex's    Status  Achieved      OT SHORT TERM GOAL #3   Title  Pt to verbalize understanding with task modifications, compensatory strategies and A/E needs to reduce pain    Status  Achieved      OT SHORT TERM GOAL #4   Title  Pt will increase wrist flexion/ extension to 55* or greater for increased functional use.    Status  Achieved        OT Long Term Goals - 11/03/16 1629      OT LONG TERM GOAL #1   Title  Independent with updated strengthening HEP     Time  8    Period  Weeks    Status  New    Target Date  01/02/17      OT LONG TERM GOAL #2   Title  Improve grip strength Lt hand by 10 lbs or greater to open jars/containers    Baseline  RUE 65 lbs, LUE 15 lbs    Time  8    Period  Weeks    Status  New      OT LONG TERM GOAL #3   Title  Assess pinch strength and set goal PRN    Time  8    Period  Weeks    Status  New      OT LONG TERM GOAL #4   Title  Lt thumb MP flexion to increase to 50* or greater for functional tasks    Time  8    Period  Weeks    Status  New      OT LONG TERM GOAL #5   Title  Pt will report ability to perform work activities with wrist and thumb pain no greater than 3/10.    Baseline  Pain 5/10 currently    Time  8    Period  Weeks    Status  New            Plan - 12/13/16 1245    Clinical Impression Statement  Pt is progressing towards goals with improved pain and A/ROM    Rehab Potential  Good    Current Impairments/barriers affecting progress:  history of multiple injuries to left wrist    OT Frequency  2x / week    OT Duration  8 weeks    OT Treatment/Interventions  Self-care/ADL training;Moist Heat;Fluidtherapy;DME and/or AE instruction;Splinting;Patient/family education;Therapeutic exercises;Ultrasound;Therapeutic exercise;Therapeutic activities;Passive range of motion;Iontophoresis;Cryotherapy;Electrical Stimulation;Parrafin;Energy conservation;Manual Therapy    Plan  continue gentle exercise and strengthening       Patient will benefit from skilled therapeutic intervention in order to improve the following deficits and impairments:  Decreased coordination, Decreased range of motion, Decreased strength, Pain, Impaired UE functional use, Decreased knowledge of use of DME, Decreased activity tolerance, Increased edema, Decreased endurance, Impaired flexibility  Visit Diagnosis: Muscle weakness (generalized)  Stiffness of left hand, not elsewhere classified  Stiffness of left wrist,  not elsewhere classified  Pain in left wrist  Pain in joints  of left hand    Problem List Patient Active Problem List   Diagnosis Date Noted  . Muscle strain 09/19/2016  . Right foot injury, subsequent encounter 08/25/2016  . Low back pain 06/27/2016  . Left wrist injury, subsequent encounter 06/27/2016  . Pain of left thumb 10/07/2015  . 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 Strain of left thumb 02/11/2015  . Strain of right forearm 02/11/2015  . Right shoulder pain 12/31/2014  . Injury of right little finger 12/31/2014  . Episodic cluster headache, 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  . 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 Hypersomnia, recurrent 12/02/2014  . Migraine aura, persistent, intractable, with status migrainosus 12/02/2014  . Lower back injury 09/30/2014  . Bipolar I disorder, most recent episode depressed ([place])   . MDD (major depressive disorder), recurrent, severe, with psychosis ([place]) 09/23/2014  . Injury of left index finger 08/20/2014  . Right ankle sprain 06/01/2014  . Contusion, multiple sites 06/01/2014  . Strain of right gastrocnemius muscle 06/01/2014  . Phonophobia 05/04/2014  . Photophobia of both eyes 05/04/2014  . Emotionally unstable borderline personality disorder ([place]) 05/04/2014  . Nausea with vomiting 05/04/2014  . Mixed bipolar I disorder ([place])   . Bipolar I disorder, most recent episode mixed ([place]) 04/18/2014  . Bipolar affective disorder, depressed, mild ([place]) 04/12/2014  . Suicidal ideation 04/12/2014  . Injury of right shoulder and upper arm 02/17/2014  . Left leg pain 06/03/2013  . Right hip pain 06/03/2013  . Migraine with status migrainosus 01/08/2013  . Personality disorder ([place])   . Chronic migraine 05/08/2012  . Contact dermatitis 11/27/2011  . Major depressive disorder, recurrent episode ([place]) 10/27/2011  . Generalized anxiety disorder 10/27/2011  . ADHD  (attention deficit hyperactivity disorder), 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  . Borderline personality disorder ([place]) 10/27/2011  . Right foot pain 09/28/2011  . 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 Ganglion cyst 09/29/2009  . PES PLANUS 07/01/2008  . BIPOLAR DISORDER UNSPECIFIED 06/09/2008    RINE,KATHRYN 12/13/2016, 1:05 PM  [facility] [address] [place], [place], [address] Phone: [phone]   Fax:  [phone]  Name: [name] MRN: [id number] Date of Birth: [birth year]

## 2016-12-15 ENCOUNTER — Encounter: Payer: Self-pay | Admitting: Occupational Therapy

## 2016-12-18 ENCOUNTER — Ambulatory Visit: Payer: PPO | Admitting: Occupational Therapy

## 2016-12-19 NOTE — Addendum Note (Signed)
Addended by: Sherrie George F on: 12/19/2016 03:23 PM   Modules accepted: Orders

## 2016-12-26 ENCOUNTER — Ambulatory Visit (INDEPENDENT_AMBULATORY_CARE_PROVIDER_SITE_OTHER): Payer: PPO | Admitting: Adult Health

## 2016-12-26 ENCOUNTER — Encounter: Payer: Self-pay | Admitting: Adult Health

## 2016-12-26 ENCOUNTER — Ambulatory Visit: Payer: PPO | Admitting: Occupational Therapy

## 2016-12-26 VITALS — BP 109/72 | HR 62 | Ht 70.0 in | Wt 270.0 lb

## 2016-12-26 DIAGNOSIS — M6281 Muscle weakness (generalized): Secondary | ICD-10-CM | POA: Diagnosis not present

## 2016-12-26 DIAGNOSIS — G43709 Chronic migraine without aura, not intractable, without status migrainosus: Secondary | ICD-10-CM

## 2016-12-26 DIAGNOSIS — Z5181 Encounter for therapeutic drug level monitoring: Secondary | ICD-10-CM | POA: Diagnosis not present

## 2016-12-26 DIAGNOSIS — F319 Bipolar disorder, unspecified: Secondary | ICD-10-CM | POA: Diagnosis not present

## 2016-12-26 DIAGNOSIS — M25642 Stiffness of left hand, not elsewhere classified: Secondary | ICD-10-CM

## 2016-12-26 DIAGNOSIS — M25632 Stiffness of left wrist, not elsewhere classified: Secondary | ICD-10-CM

## 2016-12-26 DIAGNOSIS — F609 Personality disorder, unspecified: Secondary | ICD-10-CM | POA: Diagnosis not present

## 2016-12-26 DIAGNOSIS — M25532 Pain in left wrist: Secondary | ICD-10-CM

## 2016-12-26 DIAGNOSIS — IMO0002 Reserved for concepts with insufficient information to code with codable children: Secondary | ICD-10-CM

## 2016-12-26 NOTE — Progress Notes (Signed)
I agree with the assessment and plan as directed by NP .The patient is known to me .   Ronita Hargreaves, MD  

## 2016-12-26 NOTE — Patient Instructions (Signed)
1. Grip Strengthening (Resistive Putty)   Squeeze putty using thumb and all fingers. Repeat _20___ times. Do __1__ sessions per day.   2. Roll putty into tube on table and pinch between each finger and thumb x 10 reps each. (can do ring and small finger together)  3. Hold putty, in your palm bend thumb towards palm to make an indentation, 10 reps 1x day   Copyright  VHI. All rights reserved.

## 2016-12-26 NOTE — Therapy (Addendum)
[facility] [address] [place], [place], [address] Phone: [phone]   Fax:  [phone]  Occupational Therapy Treatment  Patient Details  Name: [name] MRN: [id number] Date of Birth: 1970-08-19 Referring Provider: Dr. Barbaraann Barthel   Encounter Date: 12/26/2016  OT End of Session - 12/26/16 1108    Visit Number  10    Number of Visits  16    Date for OT Re-Evaluation  01/02/17    Authorization Type  [employer]    Authorization Time Period  G-code every 10th visit    Authorization - Visit Number  10    Authorization - Number of Visits  10    OT Start Time  1110 pt late, ice at end of session 2 units only, -no charge    OT Stop Time  1155    OT Time Calculation (min)  45 min    Equipment Utilized During Treatment  fluidotherapy    Activity Tolerance  Patient tolerated treatment well    Behavior During Therapy  [facility] for tasks assessed/performed       Past Medical History:  Diagnosis Date  . Achilles tendinitis   . Achilles tendinitis   . ADHD (attention deficit hyperactivity disorder)   . Allergy   . Arthritis   . Bipolar affective ([place])   . Bipolar disorder ([place])   . Cataracts, bilateral   . Eczema   . Ganglion cyst 09/29/2009   left wrist (2 cyst)  . Hyperprolactinemia ([place])   . Hypertension   . Lipoma   . Migraine   . Personality disorder ([place])   . Pes planus     Past Surgical History:  Procedure Laterality Date  . ANKLE SURGERY  12/88   left   . chest nodule  1990?   rt chest wall nodule removal  . GANGLION CYST EXCISION  2011  . lipoma removal    . right bunioectomy    . SHOULDER SURGERY  01/13/2011   right, partial tear  . tumor resection left thigh      There were no vitals filed for this visit.  Subjective Assessment - 12/26/16 1112    Subjective   Pt reports she saw her therapist earlier today, pt reports her wrist pain is much better    Pertinent History  wrist and thumb  pain, hx of extensor tendonitis, bipolar d/o, ADHD, borderline personality hx of [facility] admissions, (currently receiving outpatient tx), RTC tear, gaglion cyst left wrist    Patient Stated Goals  improve LUE pain and functional use    Currently in Pain?  Yes    Pain Score  5     Pain Location  Wrist    Pain Orientation  Left    Pain Descriptors / Indicators  Aching    Pain Type  Chronic pain    Pain Onset  More than a month ago    Pain Frequency  Intermittent    Aggravating Factors   use    Pain Relieving Factors  rest, heat    Multiple Pain Sites  No              Fluidotherapy x 8 mins for pain and stiffness. No adverse reactions A/ROM wrist flexion / extension, and thumb flexion extension. Graded clothespins for sustained pinch, min v.c Ice pack applied x 8 mins end of treatment. No adverse reactions Pt was instructed to call 911 or her therapist if she has thoughts of suicide. Pt verbalized understanding.  OT Education - 12/26/16 1258    Education provided  Yes    Education Details  educated pt in yellow tharaputty HEP, discussed plans for possible d/c next visit, discussed with pt that she sheould reduce splint wearing as she is able    Person(s) Educated  Patient    Methods  Explanation;Demonstration;Verbal cues;Handout    Comprehension  Verbalized understanding;Returned demonstration       OT Short Term Goals - 12/12/16 1619      OT SHORT TERM GOAL #1   Title  Independent with initial A/ROM HEP     Status  Achieved      OT SHORT TERM GOAL #2   Title  Pain to be 4/10 or under with light functional tasks and A/ROM ex's    Status  Achieved      OT SHORT TERM GOAL #3   Title  Pt to verbalize understanding with task modifications, compensatory strategies and A/E needs to reduce pain    Status  Achieved      OT SHORT TERM GOAL #4   Title  Pt will increase wrist flexion/ extension to 55* or greater for increased functional use.    Status   Achieved        OT Long Term Goals - 12/26/16 1114      OT LONG TERM GOAL #1   Title  Independent with updated strengthening HEP    Time  8    Period  Weeks    Status  On-going      OT LONG TERM GOAL #2   Title  Improve grip strength Lt hand by 10 lbs or greater to open jars/containers    Baseline  RUE 65 lbs, LUE 15 lbs    Time  8    Period  Weeks    Status  Achieved 35 lbs      OT LONG TERM GOAL #3   Title  Assess pinch strength and set goal PRN    Time  8    Period  Weeks    Status  Deferred      OT LONG TERM GOAL #4   Title  Lt thumb MP flexion to increase to 50* or greater for functional tasks    Time  8    Period  Weeks    Status  Achieved 60*      OT LONG TERM GOAL #5   Title  Pt will report ability to perform work activities with wrist and thumb pain no greater than 3/10.    Baseline  Pain 5/10 currently    Time  8    Period  Weeks    Status  On-going pain 5-6/10            Plan - 12/26/16 1125    Clinical Impression Statement  Pt demonstrates progress towards goals. Anticipate d/c next visit.    Rehab Potential  Good    Current Impairments/barriers affecting progress:  history of multiple injuries to left wrist    OT Frequency  1x / week    OT Duration  -- 3 weeks    OT Treatment/Interventions  Self-care/ADL training;Moist Heat;Fluidtherapy;DME and/or AE instruction;Splinting;Patient/family education;Therapeutic exercises;Ultrasound;Therapeutic exercise;Therapeutic activities;Passive range of motion;Iontophoresis;Cryotherapy;Electrical Stimulation;Parrafin;Energy conservation;Manual Therapy    Plan  check goals, anticipate d/c     Consulted and Agree with Plan of Care  Patient       Patient will benefit from skilled therapeutic intervention in order to improve the following deficits and impairments:  Decreased  coordination, Decreased range of motion, Decreased strength, Pain, Impaired UE functional use, Decreased knowledge of use of DME, Decreased  activity tolerance, Increased edema, Decreased endurance, Impaired flexibility  Visit Diagnosis: Muscle weakness (generalized)  Stiffness of left hand, not elsewhere classified  Stiffness of left wrist, not elsewhere classified  Pain in left wrist  G-Codes - Jan 06, 2017 1728    Functional Assessment Tool Used (Outpatient only)  grip strength, LUE 30lbs, improved overall pain, clinical impressions    Functional Limitation  Carrying, moving and handling objects    Carrying, Moving and Handling Objects Current Status [phone])  At least 20 percent but less than 40 percent impaired, limited or restricted    Carrying, Moving and Handling Objects Goal Status (V0350)  At least 1 percent but less than 20 percent impaired, limited or restricted      Occupational Therapy Progress Note  Dates of Reporting Period: 11/03/16 to Jan 06, 2017  Objective Reports of Subjective Statement: see above  Objective Measurements: see above  Goal Update: Pt is progressing towards goals with improving overall strength and decreased pain.  Plan: Anticipate d/c next visit  Reason Skilled Services are Required: Pt can benefit from continue skilled OT to address LUE pain and to reinforce HEP. Problem List Patient Active Problem List   Diagnosis Date Noted  . Muscle strain 09/19/2016  . Right foot injury, subsequent encounter 08/25/2016  . Low back pain 06/27/2016  . Left wrist injury, subsequent encounter 06/27/2016  . Pain of left thumb 10/07/2015  . 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 Strain of left thumb 02/11/2015  . Strain of right forearm 02/11/2015  . Right shoulder pain 12/31/2014  . Injury of right little finger 12/31/2014  . Episodic cluster headache, 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  . 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 Hypersomnia, recurrent 12/02/2014  . Migraine aura, persistent, intractable, with status migrainosus  12/02/2014  . Lower back injury 09/30/2014  . Bipolar I disorder, most recent episode depressed ([place])   . MDD (major depressive disorder), recurrent, severe, with psychosis ([place]) 09/23/2014  . Injury of left index finger 08/20/2014  . Right ankle sprain 06/01/2014  . Contusion, multiple sites 06/01/2014  . Strain of right gastrocnemius muscle 06/01/2014  . Phonophobia 05/04/2014  . Photophobia of both eyes 05/04/2014  . Emotionally unstable borderline personality disorder ([place]) 05/04/2014  . Nausea with vomiting 05/04/2014  . Mixed bipolar I disorder ([place])   . Bipolar I disorder, most recent episode mixed ([employer]) 04/18/2014  . Bipolar affective disorder, depressed, mild ([place]) 04/12/2014  . Suicidal ideation 04/12/2014  . Injury of right shoulder and upper arm 02/17/2014  . Left leg pain 06/03/2013  . Right hip pain 06/03/2013  . Migraine with status migrainosus 01/08/2013  . Personality disorder ([place])   . Chronic migraine 05/08/2012  . Contact dermatitis 11/27/2011  . Major depressive disorder, recurrent episode ([place]) 10/27/2011  . Generalized anxiety disorder 10/27/2011  . 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 Borderline personality disorder ([place]) 10/27/2011  . Right foot pain 09/28/2011  . 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 Ganglion cyst 09/29/2009  . PES PLANUS 07/01/2008  . BIPOLAR DISORDER UNSPECIFIED 06/09/2008    RINE,KATHRYN 2017-01-06, 5:32 PM Theone Murdoch, OTR/L [phone] Phone: [phone] 5:32 PM 2017-01-06 [facility] [address] [place], [place], [address] Phone: [phone]   Fax:  [phone]  Name: EVANI SHRIDER MRN: [id number] Date of Birth: Apr 27, 1970

## 2016-12-26 NOTE — Patient Instructions (Addendum)
Your Plan:  Continue Depakote Will check level today  Thank you for coming to see [place] at [facility]. I hope we have been able to provide you high quality care today.  You may receive a patient satisfaction survey over the next few weeks. We would appreciate your feedback and comments so that we may continue to improve ourselves and the health of our patients.

## 2016-12-26 NOTE — Progress Notes (Signed)
PATIENT: [name] DOB: 10-19-1970  REASON FOR VISIT: follow up HISTORY FROM: patient  HISTORY OF PRESENT ILLNESS: Today 12/26/16 [name] is a 46 year old female with a history of migraine headaches and bipolar disorder.  She returns today for follow-up.  She reports that in the last month her headache frequency has increased.  She also states that her depression has also worsened.  She states in the last month she is began to have thoughts of suicide again.  She was hospitalized in June for suicidal thoughts.  She reports at that time the psychiatrist decrease Latuda and discontinued Requip and Ambien.  She states that she felt that she was doing well for a while however just recently she has began having worsening symptoms.  She has an appointment today with her therapist.  She has a follow-up appointment with her psychiatrist December 10.  She reports that she has moved her psychiatry appointments twice in the last several months.  She states that her headaches always coincide with her mood.  She remains on Depakote.  In the hospital her level was mildly elevated.  She returns today for an evaluation.  HISTORY 06/22/2016, I see [name] today, she has developed tendinitis in her left wrist, and x-ray confirmed yesterday that there is no bony injury. The tendinitis is painful and keeps her from exercising. She continued slowly to gain weight since her exercise regimen has been limited. She continues to work gainfully employed, she still has some migraines- had another flurry with nausea and drowsiness, but her sleep has improved, she is here for routine revisit. Today  Level 7- out of 10 with nausea. Her mother brought her as she did not trust her driving. Will give depakene today and a small dose of Steroids.    REVIEW OF SYSTEMS: Out of a complete 14 system review of symptoms, the patient complains only of the following symptoms, and all other reviewed systems are  negative.  See HPI  ALLERGIES: Allergies  Allergen Reactions  . Adhesive [Tape] Itching and Rash    Also reacted to Steri Strips and Band-Aids.  . Dilaudid [Hydromorphone Hcl] Itching  . Morphine Nausea And Vomiting  . Penicillins Hives    Has patient had a PCN reaction causing immediate rash, facial/tongue/throat swelling, SOB or lightheadedness with hypotension: YES Has patient had a PCN reaction causing severe rash involving mucus membranes or skin necrosis: NO Has patient had a PCN reaction that required hospitalization NO Has patient had a PCN reaction occurring within the last 10 years:NO If all of the above answers are "NO", then may proceed with Cephalosporin use.  Marland Kitchen Percocet [Oxycodone-Acetaminophen] Itching  . Prednisone Hives  . Provera [Medroxyprogesterone Acetate] Other (See Comments)    Causes manic episodes  . Ultram [Tramadol Hcl] Itching    HOME MEDICATIONS: Outpatient Medications Prior to Visit  Medication Sig Dispense Refill  . clobetasol cream (TEMOVATE) 0.05 % APPLY TO AFFFECTED AREA UP TO 2 TIMES A DAY AS NEEDED (NOT TO FACE, GROIN OR AXILLA)  0  . diclofenac (VOLTAREN) 75 MG EC tablet take 1 tablet by mouth twice a day 60 tablet 1  . divalproex (DEPAKOTE ER) 500 MG 24 hr tablet take 3 tablets by mouth at bedtime 90 tablet 0  . hydrOXYzine (ATARAX/VISTARIL) 50 MG tablet Take 1 tablet (50 mg total) by mouth 3 (three) times daily as needed for anxiety. 60 tablet 0  . hydrOXYzine (VISTARIL) 50 MG capsule   0  . lamoTRIgine (  LAMICTAL) 200 MG tablet Take 1 tablet (200 mg total) by mouth at bedtime. 30 tablet 0  . levocetirizine (XYZAL) 5 MG tablet Take 5 mg every evening by mouth.  0  . lurasidone (LATUDA) 80 MG TABS tablet Take 1 tablet (80 mg total) by mouth daily with supper. 30 tablet 0  . metaxalone (SKELAXIN) 800 MG tablet   0  . metoprolol succinate (TOPROL-XL) 100 MG 24 hr tablet Take 1 tablet (100 mg total) by mouth daily. Take with or immediately  following a meal. 30 tablet 0  . sertraline (ZOLOFT) 50 MG tablet Take 1 tablet (50 mg total) by mouth daily. 30 tablet 0  . SUMAtriptan 6 MG/0.5ML SOAJ   0  . traZODone (DESYREL) 50 MG tablet Take 1 tablet (50 mg total) by mouth at bedtime as needed for sleep. 30 tablet 0  . rOPINIRole (REQUIP) 2 MG tablet   0  . zolpidem (AMBIEN) 10 MG tablet   0   No facility-administered medications prior to visit.     PAST MEDICAL HISTORY: Past Medical History:  Diagnosis Date  . Achilles tendinitis   . Achilles tendinitis   . ADHD (attention deficit hyperactivity disorder)   . Allergy   . Arthritis   . Bipolar affective ([place])   . Bipolar disorder ([place])   . Cataracts, bilateral   . Eczema   . Ganglion cyst 09/29/2009   left wrist (2 cyst)  . Hyperprolactinemia ([place])   . Hypertension   . Lipoma   . Migraine   . Personality disorder ([place])   . Pes planus     PAST SURGICAL HISTORY: Past Surgical History:  Procedure Laterality Date  . ANKLE SURGERY  12/88   left   . chest nodule  1990?   rt chest wall nodule removal  . GANGLION CYST EXCISION  2011  . lipoma removal    . right bunioectomy    . SHOULDER SURGERY  01/13/2011   right, partial tear  . tumor resection left thigh      FAMILY HISTORY: Family History  Problem Relation Age of Onset  . Hypertension Mother   . Hyperlipidemia Mother   . Heart attack Father   . Heart disease Father   . Hypertension Father   . Bipolar disorder Father   . Diabetes Paternal Grandfather   . Heart disease Maternal Aunt   . Breast cancer Maternal Aunt   . Heart disease Maternal Grandmother   . Cancer Maternal Grandmother        colon    SOCIAL HISTORY: Social History   Socioeconomic History  . Marital status: Single    Spouse name: Not on file  . Number of children: 0  . Years of education: Not on file  . Highest education level: Not on file  Social Needs  . Financial resource strain: Not on file  . Food insecurity - worry: Not  on file  . Food insecurity - inability: Not on file  . Transportation needs - medical: Not on file  . Transportation needs - non-medical: Not on file  Occupational History    Employer: [employer]  Tobacco Use  . Smoking status: Never Smoker  . Smokeless tobacco: Never Used  Substance and Sexual Activity  . Alcohol use: No    Alcohol/week: 0.0 oz  . Drug use: No  . Sexual activity: No    Birth control/protection: Pill  Other Topics Concern  . Not on file  Social History Narrative   Caffeine  2 sodas daily, 1 cup coffee daily.      PHYSICAL EXAM  Vitals:   12/26/16 0819  BP: 109/72  Pulse: 62  Weight: 270 lb (122.5 kg)  Height: 5\' 10"  (1.778 m)   Body mass index is 38.74 kg/m.  Generalized: Well developed, in no acute distress   Neurological examination  Mentation: Alert oriented to time, place, history taking. Follows all commands speech and language fluent Cranial nerve II-XII: Pupils were equal round reactive to light. Extraocular movements were full, visual field were full on confrontational test. Facial sensation and strength were normal. Uvula tongue midline. Head turning and shoulder shrug  were normal and symmetric. Motor: The motor testing reveals 5 over 5 strength of all 4 extremities. Good symmetric motor tone is noted throughout.  Sensory: Sensory testing is intact to soft touch on all 4 extremities. No evidence of extinction is noted.  Coordination: Cerebellar testing reveals good finger-nose-finger and heel-to-shin bilaterally.  Gait and station: Gait is normal. Tandem gait is normal. Romberg is negative. No drift is seen.  Reflexes: Deep tendon reflexes are symmetric and normal bilaterally.   DIAGNOSTIC DATA (LABS, IMAGING, TESTING) - I reviewed patient records, labs, notes, testing and imaging myself where available.  Lab Results  Component Value Date   WBC 6.3 07/15/2016   HGB 13.4 07/15/2016   HCT 40.6 07/15/2016   MCV 93.3 07/15/2016   PLT 203  07/15/2016      Component Value Date/Time   NA 139 07/15/2016 2117   NA 143 02/21/2016 1017   K 4.1 07/15/2016 2117   CL 104 07/15/2016 2117   CO2 25 07/15/2016 2117   GLUCOSE 116 (H) 07/15/2016 2117   BUN 13 07/15/2016 2117   BUN 10 02/21/2016 1017   CREATININE 1.08 (H) 07/15/2016 2117   CREATININE 0.83 09/11/2011 1524   CALCIUM 9.4 07/15/2016 2117   PROT 6.0 (L) 07/15/2016 2117   PROT 5.6 (L) 02/21/2016 1017   ALBUMIN 3.7 07/15/2016 2117   ALBUMIN 3.9 02/21/2016 1017   AST 25 07/15/2016 2117   ALT 16 07/15/2016 2117   ALKPHOS 60 07/15/2016 2117   BILITOT 0.4 07/15/2016 2117   BILITOT 0.5 02/21/2016 1017   GFRNONAA >60 07/15/2016 2117   GFRAA >60 07/15/2016 2117   Lab Results  Component Value Date   CHOL 178 07/19/2016   HDL 52 07/19/2016   LDLCALC 100 (H) 07/19/2016   TRIG 132 07/19/2016   CHOLHDL 3.4 07/19/2016   Lab Results  Component Value Date   HGBA1C 5.1 07/19/2016   No results found for: [id number] Lab Results  Component Value Date   TSH 2.640 07/19/2016      ASSESSMENT AND PLAN 46 y.o. year old female  has a past medical history of Achilles tendinitis, Achilles tendinitis, ADHD (attention deficit hyperactivity disorder), Allergy, Arthritis, Bipolar affective ([place]), Bipolar disorder ([place]), Cataracts, bilateral, Eczema, Ganglion cyst (09/29/2009), Hyperprolactinemia ([place]), Hypertension, Lipoma, Migraine, Personality disorder ([place]), and Pes planus. here with:    1.  Migraine headaches 2.  Bipolar disorder  3.  Personality disorder  I advised the patient that we will recheck a Depakote level.  It was mildly elevated when she was in the hospital in June.  If her Depakote is in normal range we may consider increasing it slightly to see if it gives her any additional benefit with her headaches.  I did advise the patient that she should keep all appointments with her psychiatrist.  She has an appointment today with her therapist  and then her psychiatrist  December 10.  I did advise the patient that if she begins to have suicidal thoughts with the intent to act on them she should call 911.  Also advised that she should make her therapist aware that she is having thoughts of suicide as her psychiatrist may want to see her sooner.  She voiced understanding.  She will follow-up in 6 months or sooner if needed.   Ward Givens, MSN, NP-C 12/26/2016, 9:09 AM [facility] [address], [place], [place] [address] [phone]

## 2016-12-27 ENCOUNTER — Telehealth: Payer: Self-pay | Admitting: *Deleted

## 2016-12-27 LAB — VALPROIC ACID LEVEL: Valproic Acid Lvl: 101 ug/mL — ABNORMAL HIGH (ref 50–100)

## 2016-12-27 NOTE — Telephone Encounter (Signed)
Unable to reach patient re: lab results; voice mailbox full.

## 2016-12-27 NOTE — Telephone Encounter (Signed)
Spoke with patient and informed her that her Depakote level is still slightly elevated. [name] will not increase her medication at this time, but wait till she follows up with psychiatry to see if they make any adjustments to her medications which may also be beneficial for her headaches. Patient verbalized understanding. She then requested NP call her. She would not state the purpose of her request but stated she had spoken with NP yesterday. She stated NP would know why she is wanting a call back.

## 2016-12-27 NOTE — Telephone Encounter (Signed)
I called the patient.  She states that she wanted to let me know that she is still having thoughts of suicide.  She states that she cannot "shake these thoughts."  She states that she is with her mom right now.  I advised that she should go to the hospital immediately however she states that she was trying to get things completed today before she went to the hospital.  Reports that she has clothes that she needs to washtoday before she can go to the hospital.  Again I reemphasized that if she is having these thoughts with a plan to carry it out then she should call 911 or go to the emergency room.  She voiced understanding and states that she would go to the hospital.

## 2016-12-28 ENCOUNTER — Encounter (HOSPITAL_COMMUNITY): Payer: Self-pay | Admitting: *Deleted

## 2016-12-28 ENCOUNTER — Other Ambulatory Visit: Payer: Self-pay

## 2016-12-28 ENCOUNTER — Inpatient Hospital Stay (HOSPITAL_COMMUNITY)
Admission: RE | Admit: 2016-12-28 | Discharge: 2017-01-05 | DRG: 885 | Disposition: A | Discharge status: Home / Self Care | Payer: PPO | Attending: Psychiatry | Admitting: Psychiatry

## 2016-12-28 DIAGNOSIS — Z79899 Other long term (current) drug therapy: Secondary | ICD-10-CM | POA: Diagnosis not present

## 2016-12-28 DIAGNOSIS — G4700 Insomnia, unspecified: Secondary | ICD-10-CM | POA: Diagnosis present

## 2016-12-28 DIAGNOSIS — Z91048 Other nonmedicinal substance allergy status: Secondary | ICD-10-CM

## 2016-12-28 DIAGNOSIS — F419 Anxiety disorder, unspecified: Secondary | ICD-10-CM | POA: Diagnosis not present

## 2016-12-28 DIAGNOSIS — G43909 Migraine, unspecified, not intractable, without status migrainosus: Secondary | ICD-10-CM | POA: Diagnosis not present

## 2016-12-28 DIAGNOSIS — R450 Nervousness: Secondary | ICD-10-CM | POA: Diagnosis not present

## 2016-12-28 DIAGNOSIS — Z885 Allergy status to narcotic agent status: Secondary | ICD-10-CM | POA: Diagnosis not present

## 2016-12-28 DIAGNOSIS — Z880 Allergy status to penicillin: Secondary | ICD-10-CM | POA: Diagnosis not present

## 2016-12-28 DIAGNOSIS — F332 Major depressive disorder, recurrent severe without psychotic features: Secondary | ICD-10-CM | POA: Diagnosis not present

## 2016-12-28 DIAGNOSIS — F39 Unspecified mood [affective] disorder: Secondary | ICD-10-CM | POA: Diagnosis not present

## 2016-12-28 DIAGNOSIS — Z888 Allergy status to other drugs, medicaments and biological substances status: Secondary | ICD-10-CM | POA: Diagnosis not present

## 2016-12-28 DIAGNOSIS — R45851 Suicidal ideations: Secondary | ICD-10-CM | POA: Diagnosis not present

## 2016-12-28 DIAGNOSIS — Z818 Family history of other mental and behavioral disorders: Secondary | ICD-10-CM | POA: Diagnosis not present

## 2016-12-28 DIAGNOSIS — F3130 Bipolar disorder, current episode depressed, mild or moderate severity, unspecified: Secondary | ICD-10-CM | POA: Diagnosis not present

## 2016-12-28 MED ORDER — LAMOTRIGINE 100 MG PO TABS
ORAL_TABLET | ORAL | Status: AC
  Filled 2016-12-28: qty 2

## 2016-12-28 MED ORDER — MAGNESIUM HYDROXIDE 400 MG/5ML PO SUSP: 30.0000 mL | Freq: Every day | ORAL | Status: DC | PRN

## 2016-12-28 MED ORDER — DICLOFENAC SODIUM 75 MG PO TBEC
75.0000 mg | DELAYED_RELEASE_TABLET | Freq: Two times a day (BID) | ORAL | Status: DC
  Administered 2016-12-29 – 2016-12-31 (×5): 75 mg via ORAL
  Filled 2016-12-28 (×10): qty 1

## 2016-12-28 MED ORDER — SERTRALINE HCL 50 MG PO TABS
50.0000 mg | ORAL_TABLET | Freq: Every day | ORAL | Status: DC
  Administered 2016-12-29: 50 mg via ORAL
  Filled 2016-12-28 (×3): qty 1

## 2016-12-28 MED ORDER — ALUM & MAG HYDROXIDE-SIMETH 200-200-20 MG/5ML PO SUSP: 30.0000 mL | ORAL | Status: DC | PRN

## 2016-12-28 MED ORDER — DIVALPROEX SODIUM ER 500 MG PO TB24: 1500.0000 mg | ORAL_TABLET | Freq: Every day | ORAL | Status: DC

## 2016-12-28 MED ORDER — HYDROXYZINE HCL 50 MG PO TABS
50.0000 mg | ORAL_TABLET | Freq: Three times a day (TID) | ORAL | Status: DC | PRN
  Administered 2016-12-28 – 2017-01-05 (×20): 50 mg via ORAL
  Filled 2016-12-28 (×20): qty 1

## 2016-12-28 MED ORDER — SUMATRIPTAN SUCCINATE 50 MG PO TABS
50.0000 mg | ORAL_TABLET | ORAL | Status: AC | PRN
  Administered 2016-12-28 – 2016-12-30 (×2): 50 mg via ORAL
  Filled 2016-12-28: qty 1

## 2016-12-28 MED ORDER — LURASIDONE HCL 80 MG PO TABS
80.0000 mg | ORAL_TABLET | Freq: Every day | ORAL | Status: DC
  Administered 2016-12-29 – 2017-01-04 (×7): 80 mg via ORAL
  Filled 2016-12-28 (×10): qty 1

## 2016-12-28 MED ORDER — LAMOTRIGINE 100 MG PO TABS
200.0000 mg | ORAL_TABLET | Freq: Every day | ORAL | Status: DC
  Administered 2016-12-28 – 2017-01-04 (×8): 200 mg via ORAL
  Filled 2016-12-28 (×3): qty 2
  Filled 2016-12-28: qty 1
  Filled 2016-12-28 (×2): qty 2
  Filled 2016-12-28: qty 1
  Filled 2016-12-28: qty 2
  Filled 2016-12-28: qty 1
  Filled 2016-12-28: qty 2

## 2016-12-28 MED ORDER — TRAZODONE HCL 50 MG PO TABS
50.0000 mg | ORAL_TABLET | Freq: Every evening | ORAL | Status: DC | PRN
  Administered 2016-12-28 – 2017-01-04 (×8): 50 mg via ORAL
  Filled 2016-12-28 (×7): qty 1

## 2016-12-28 MED ORDER — METOPROLOL SUCCINATE ER 50 MG PO TB24
100.0000 mg | ORAL_TABLET | Freq: Every day | ORAL | Status: DC
  Administered 2016-12-29 – 2017-01-05 (×7): 100 mg via ORAL
  Filled 2016-12-28 (×2): qty 2
  Filled 2016-12-28 (×2): qty 1
  Filled 2016-12-28: qty 2
  Filled 2016-12-28: qty 1
  Filled 2016-12-28: qty 2
  Filled 2016-12-28: qty 1
  Filled 2016-12-28 (×4): qty 2

## 2016-12-28 NOTE — Tx Team (Signed)
Initial Treatment Plan 12/28/2016 11:19 PM [name] [id number]    PATIENT STRESSORS: Financial difficulties Occupational concerns   PATIENT STRENGTHS: Average or above average intelligence Communication skills General fund of knowledge Motivation for treatment/growth Physical Health Supportive family/friends   PATIENT IDENTIFIED PROBLEMS: Increased agitation  Suicidal thoughts  "Suicidal thoughts to get better"  "Feel better about myself"               DISCHARGE CRITERIA:  Improved stabilization in mood, thinking, and/or behavior Verbal commitment to aftercare and medication compliance  PRELIMINARY DISCHARGE PLAN: Outpatient therapy Medication management  PATIENT/FAMILY INVOLVEMENT: This treatment plan has been presented to and reviewed with the patient, [name].  The patient and family have been given the opportunity to ask questions and make suggestions.  Windell Moment, RN 12/28/2016, 11:19 PM
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
## 2016-12-28 NOTE — Tx Team (Signed)
Initial Treatment Plan 12/28/2016 7:05 PM [name] [id number]    PATIENT STRESSORS: Financial difficulties   PATIENT STRENGTHS: Average or above average intelligence General fund of knowledge Physical Health Supportive family/friends Work skills   PATIENT IDENTIFIED PROBLEMS: Increased agitation  Suicidal thoughts                   DISCHARGE CRITERIA:  Ability to meet basic life and health needs Adequate post-discharge living arrangements Improved stabilization in mood, thinking, and/or behavior Medical problems require only outpatient monitoring Motivation to continue treatment in a less acute level of care Need for constant or close observation no longer present Reduction of life-threatening or endangering symptoms to within safe limits Safe-care adequate arrangements made Verbal commitment to aftercare and medication compliance  PRELIMINARY DISCHARGE PLAN: Outpatient therapy Return to previous living arrangement Return to previous work or school arrangements  PATIENT/FAMILY INVOLVEMENT: This treatment plan has been presented to and reviewed with the patient, [name], and/or family member,.  The patient and family have been given the opportunity to ask questions and make suggestions.  Mosie Lukes, RN 12/28/2016, 7:05 PM

## 2016-12-28 NOTE — BH Assessment (Signed)
Assessment Note  [name] is an 46 y.o. female. Pt reports SI with a plan to stab herself. Pt denies HI and AVH. Pt reports current outpatient treatment. Pt is currently prescribed Lexapro, Lamictal, Latuda, Vistaril, Tegretal, Zoloft, and Depakote. Pt reports multiple inpatient hospitalization. Pt's last hospitalization was June 2018. Pt states if does not feel safe returning home.   Shuvon, NP recommends inpatient treatment. Pt accepted to [facility].  Diagnosis:  F33.2 MDD; F60.3 Borderline  Past Medical History:  Past Medical History:  Diagnosis Date  . Achilles tendinitis   . Achilles tendinitis   . ADHD (attention deficit hyperactivity disorder)   . Allergy   . Arthritis   . Bipolar affective ([place])   . Bipolar disorder ([place])   . Cataracts, bilateral   . Eczema   . Ganglion cyst 09/29/2009   left wrist (2 cyst)  . Hyperprolactinemia ([place])   . Hypertension   . Lipoma   . Migraine   . Personality disorder ([place])   . Pes planus     Past Surgical History:  Procedure Laterality Date  . ANKLE SURGERY  12/88   left   . chest nodule  1990?   rt chest wall nodule removal  . GANGLION CYST EXCISION  2011  . lipoma removal    . right bunioectomy    . SHOULDER SURGERY  01/13/2011   right, partial tear  . tumor resection left thigh      Family History:  Family History  Problem Relation Age of Onset  . Hypertension Mother   . Hyperlipidemia Mother   . Heart attack Father   . Heart disease Father   . Hypertension Father   . Bipolar disorder Father   . Diabetes Paternal Grandfather   . Heart disease Maternal Aunt   . Breast cancer Maternal Aunt   . Heart disease Maternal Grandmother   . Cancer Maternal Grandmother        colon    Social History:  reports that  has never smoked. she has never used smokeless tobacco. She reports that she does not drink alcohol or use drugs.  Additional Social History:  Alcohol / Drug Use Pain Medications: please see  mar Prescriptions: please see mar Over the Counter: please see mar History of alcohol / drug use?: No history of alcohol / drug abuse Longest period of sobriety (when/how long): NA  CIWA:   COWS:    Allergies:  Allergies  Allergen Reactions  . Adhesive [Tape] Itching and Rash    Also reacted to Steri Strips and Band-Aids.  . Dilaudid [Hydromorphone Hcl] Itching  . Morphine Nausea And Vomiting  . Penicillins Hives    Has patient had a PCN reaction causing immediate rash, facial/tongue/throat swelling, SOB or lightheadedness with hypotension: YES Has patient had a PCN reaction causing severe rash involving mucus membranes or skin necrosis: NO Has patient had a PCN reaction that required hospitalization NO Has patient had a PCN reaction occurring within the last 10 years:NO If all of the above answers are "NO", then may proceed with Cephalosporin use.  Marland Kitchen Percocet [Oxycodone-Acetaminophen] Itching  . Prednisone Hives  . Provera [Medroxyprogesterone Acetate] Other (See Comments)    Causes manic episodes  . Ultram [Tramadol Hcl] Itching    Home Medications:  No medications prior to admission.    OB/GYN Status:  No LMP recorded.  General Assessment Data Location of Assessment: [facility] Assessment Services TTS Assessment: In system Is this a Tele or Face-to-Face Assessment?: Face-to-Face Is  this an Initial Assessment or a Re-assessment for this encounter?: Initial Assessment Marital status: Single Maiden name: NA Is patient pregnant?: No Pregnancy Status: No Living Arrangements: Parent Can pt return to current living arrangement?: Yes Admission Status: Voluntary Is patient capable of signing voluntary admission?: Yes Referral Source: Self/Family/Friend Insurance type: Advantage  Medical Screening Exam ([place]) Medical Exam completed: Yes  Crisis Care Plan Living Arrangements: Parent Legal Guardian: Other:(self) Name of Psychiatrist: NA Name of Therapist:  NA  Education Status Is patient currently in school?: No Current Grade: NA 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 completed: BA Name of school: NA Contact person: NA  Risk to self with the past 6 months Suicidal Ideation: Yes-Currently Present Has patient been a risk to self within the past 6 months prior to admission? : Yes Suicidal Intent: Yes-Currently Present Has patient had any suicidal intent within the past 6 months prior to admission? : Yes Is patient at risk for suicide?: Yes Suicidal Plan?: Yes-Currently Present Has patient had any suicidal plan within the past 6 months prior to admission? : Yes Specify Current Suicidal Plan: plan to stab self Access to Means: Yes Specify Access to Suicidal Means: access to a knife What has been your use of drugs/alcohol within the last 12 months?: NA Previous Attempts/Gestures: Yes How many times?: 3 Other Self Harm Risks: NA Triggers for Past Attempts: None known Intentional Self Injurious Behavior: None Family Suicide History: No Recent stressful life event(s): Trauma (Comment), Conflict (Comment) Persecutory voices/beliefs?: No Depression: Yes Depression Symptoms: Despondent, Insomnia, Tearfulness, Fatigue, Isolating, Guilt, Loss of interest in usual pleasures, Feeling worthless/self pity, Feeling angry/irritable Substance abuse history and/or treatment for substance abuse?: No Suicide prevention information given to non-admitted patients: Not applicable  Risk to Others within the past 6 months Homicidal Ideation: No Does patient have any lifetime risk of violence toward others beyond the six months prior to admission? : No Thoughts of Harm to Others: No Current Homicidal Intent: No Current Homicidal Plan: No Access to Homicidal Means: No Identified Victim: NA History of harm to others?: No Assessment of Violence: None Noted Violent Behavior Description: NA Does patient have access to weapons?: No Criminal Charges Pending?:  No Does patient have a court date: No Is patient on probation?: No  Psychosis Hallucinations: None noted Delusions: None noted  Mental Status Report Appearance/Hygiene: Unremarkable Eye Contact: Fair Motor Activity: Freedom of movement Speech: Logical/coherent Level of Consciousness: Alert Mood: Depressed Affect: Depressed Anxiety Level: Minimal Thought Processes: Coherent, Relevant Judgement: Unimpaired Orientation: Person, Place, Time, Situation Obsessive Compulsive Thoughts/Behaviors: None  Cognitive Functioning Concentration: Normal Memory: Recent Intact, Remote Intact IQ: Average Insight: Poor Impulse Control: Poor Appetite: Fair Weight Loss: 0 Weight Gain: 0 Sleep: Decreased Total Hours of Sleep: 6 Vegetative Symptoms: None  ADLScreening [facility]) Patient's cognitive ability adequate to safely complete daily activities?: Yes Patient able to express need for assistance with ADLs?: Yes Independently performs ADLs?: Yes (appropriate for developmental age)  Prior Inpatient Therapy Prior Inpatient Therapy: Yes Prior Therapy Dates: multiple-last 06/2016 Prior Therapy Facilty/Provider(s): [facility] Reason for Treatment: depression/SI  Prior Outpatient Therapy Prior Outpatient Therapy: Yes Prior Therapy Dates: current Prior Therapy Facilty/Provider(s): [facility] Reason for Treatment: depression Does patient have an ACCT team?: No Does patient have Intensive In-House Services?  : No Does patient have [facility] services? : No Does patient have P4CC services?: No  ADL Screening (condition at time of admission) Patient's cognitive ability adequate to safely complete daily activities?: Yes Is the patient  deaf or have difficulty hearing?: No Does the patient have difficulty seeing, even when wearing glasses/contacts?: No Patient able to express need for assistance with ADLs?: Yes Does the patient have difficulty dressing or bathing?: No Independently  performs ADLs?: Yes (appropriate for developmental age) Does the patient have difficulty walking or climbing stairs?: No Weakness of Legs: None       Abuse/Neglect Assessment (Assessment to be complete while patient is alone) Abuse/Neglect Assessment Can Be Completed: Yes Physical Abuse: Denies, provider concerned (Comment) Verbal Abuse: Denies, provider concerned (Comment) Sexual Abuse: Denies, provider concered (Comment) Exploitation of patient/patient's resources: Denies, provider concerned (Comment)     Advance Directives (For Healthcare) Does Patient Have a Medical Advance Directive?: No Would patient like information on creating a medical advance directive?: No - Patient declined    Additional Information 1:1 In Past 12 Months?: No CIRT Risk: No Elopement Risk: No Does patient have medical clearance?: Yes     Disposition:  Disposition Initial Assessment Completed for this Encounter: Yes Disposition of Patient: Inpatient treatment program Type of inpatient treatment program: Adult  On Site Evaluation by:   Reviewed with Physician:    Cyndia Bent 12/28/2016 5:44 PM

## 2016-12-28 NOTE — H&P (Signed)
Behavioral Health Medical Screening Exam  [name] is an 46 y.o. female patient presents to [facility] as walk-in with complaints of worsening depression, anxiety, and suicidal ideation with plan to overdose or stab herself.  Patient is unable to contract for safety  Total Time spent with patient: 45 minutes  Psychiatric Specialty Exam: Physical Exam  Constitutional: She is oriented to person, place, and time.  Neck: Normal range of motion.  Cardiovascular: Normal rate and regular rhythm.  Respiratory: Effort normal and breath sounds normal.  Neurological: She is alert and oriented to person, place, and time.  Skin: Skin is warm and dry.    Review of Systems  Psychiatric/Behavioral: Positive for depression and suicidal ideas. Negative for hallucinations. The patient is nervous/anxious and has insomnia.     Blood pressure 117/69, pulse 64, temperature 98.5 F (36.9 C), resp. rate 18, SpO2 100 %.There is no height or weight on file to calculate BMI.  General Appearance: Casual and Neat  Eye Contact:  Good  Speech:  Clear and Coherent  Volume:  Normal  Mood:  Anxious and Depressed  Affect:  Depressed  Thought Process:  Coherent and Goal Directed  Orientation:  Full (Time, Place, and Person)  Thought Content:  Denies hallucinations delusions, and paranoia  Suicidal Thoughts:  Yes.  with intent/plan  Homicidal Thoughts:  No  Memory:  Immediate;   Good Recent;   Good Remote;   Good  Judgement:  Fair  Insight:  Fair  Psychomotor Activity:  Normal  Concentration: Concentration: Fair and Attention Span: Fair  Recall:  Good  Fund of Knowledge:Good  Language: Good  Akathisia:  No  Handed:  Right  AIMS (if indicated):     Assets:  Communication Skills Desire for Improvement Housing Social Support  Sleep:       Musculoskeletal: Strength & Muscle Tone: within normal limits Gait & Station: normal Patient leans: N/A  Blood pressure 117/69, pulse 64, temperature 98.5 F  (36.9 C), resp. rate 18, SpO2 100 %.  Recommendations:  Inpatient psychiatric treatment.  Patient accepted by [facility]  Based on my evaluation the patient does not appear to have an emergency medical condition.  Shabree Tebbetts, NP 12/28/2016, 6:41 PM

## 2016-12-29 DIAGNOSIS — F419 Anxiety disorder, unspecified: Secondary | ICD-10-CM

## 2016-12-29 DIAGNOSIS — R450 Nervousness: Secondary | ICD-10-CM

## 2016-12-29 DIAGNOSIS — F3130 Bipolar disorder, current episode depressed, mild or moderate severity, unspecified: Secondary | ICD-10-CM

## 2016-12-29 DIAGNOSIS — Z818 Family history of other mental and behavioral disorders: Secondary | ICD-10-CM

## 2016-12-29 LAB — URINALYSIS, COMPLETE (UACMP) WITH MICROSCOPIC
Bacteria, UA: NONE SEEN
Bilirubin Urine: NEGATIVE
Glucose, UA: NEGATIVE mg/dL
Hgb urine dipstick: NEGATIVE
Ketones, ur: NEGATIVE mg/dL
Leukocytes, UA: NEGATIVE
Nitrite: NEGATIVE
Protein, ur: NEGATIVE mg/dL
Specific Gravity, Urine: 1.009 (ref 1.005–1.030)
pH: 7 (ref 5.0–8.0)

## 2016-12-29 LAB — RAPID URINE DRUG SCREEN, HOSP PERFORMED
Amphetamines: NOT DETECTED
Barbiturates: NOT DETECTED
Benzodiazepines: NOT DETECTED
Cocaine: NOT DETECTED
Opiates: NOT DETECTED
Tetrahydrocannabinol: NOT DETECTED

## 2016-12-29 LAB — COMPREHENSIVE METABOLIC PANEL
ALT: 14 U/L (ref 14–54)
AST: 27 U/L (ref 15–41)
Albumin: 3.8 g/dL (ref 3.5–5.0)
Alkaline Phosphatase: 60 U/L (ref 38–126)
Anion gap: 8 (ref 5–15)
BUN: 16 mg/dL (ref 6–20)
CO2: 28 mmol/L (ref 22–32)
Calcium: 9.5 mg/dL (ref 8.9–10.3)
Chloride: 104 mmol/L (ref 101–111)
Creatinine, Ser: 1.09 mg/dL — ABNORMAL HIGH (ref 0.44–1.00)
GFR calc Af Amer: >60 mL/min (ref >60)
GFR calc non Af Amer: 59 mL/min — ABNORMAL LOW (ref >60)
Glucose, Bld: 88 mg/dL (ref 65–99)
Potassium: 3.9 mmol/L (ref 3.5–5.1)
Sodium: 140 mmol/L (ref 135–145)
Total Bilirubin: 0.6 mg/dL (ref 0.3–1.2)
Total Protein: 6.3 g/dL — ABNORMAL LOW (ref 6.5–8.1)

## 2016-12-29 LAB — CBC
HCT: 44.6 % (ref 36.0–46.0)
Hemoglobin: 15.2 g/dL — ABNORMAL HIGH (ref 12.0–15.0)
MCH: 32 pg (ref 26.0–34.0)
MCHC: 34.1 g/dL (ref 30.0–36.0)
MCV: 93.9 fL (ref 78.0–100.0)
Platelets: 241 10*3/uL (ref 150–400)
RBC: 4.75 MIL/uL (ref 3.87–5.11)
RDW: 12.8 % (ref 11.5–15.5)
WBC: 7.4 10*3/uL (ref 4.0–10.5)

## 2016-12-29 LAB — LIPID PANEL
Cholesterol: 198 mg/dL (ref 0–200)
HDL: 53 mg/dL (ref >40)
LDL Cholesterol: 118 mg/dL — ABNORMAL HIGH (ref 0–99)
Total CHOL/HDL Ratio: 3.7 RATIO
Triglycerides: 136 mg/dL (ref <150)
VLDL: 27 mg/dL (ref 0–40)

## 2016-12-29 LAB — HEMOGLOBIN A1C
Hgb A1c MFr Bld: 5.1 % (ref 4.8–5.6)
Mean Plasma Glucose: 99.67 mg/dL

## 2016-12-29 LAB — VALPROIC ACID LEVEL: Valproic Acid Lvl: 73 ug/mL (ref 50.0–100.0)

## 2016-12-29 LAB — ETHANOL: Alcohol, Ethyl (B): <10 mg/dL (ref <10)

## 2016-12-29 LAB — PREGNANCY, URINE: Preg Test, Ur: NEGATIVE

## 2016-12-29 LAB — TSH: TSH: 4.288 u[IU]/mL (ref 0.350–4.500)

## 2016-12-29 MED ORDER — DIVALPROEX SODIUM ER 500 MG PO TB24
1500.0000 mg | ORAL_TABLET | Freq: Every day | ORAL | Status: DC
  Administered 2016-12-30 – 2017-01-04 (×6): 1500 mg via ORAL
  Filled 2016-12-29 (×8): qty 3

## 2016-12-29 MED ORDER — DIVALPROEX SODIUM ER 500 MG PO TB24
1500.0000 mg | ORAL_TABLET | Freq: Every day | ORAL | Status: DC
  Filled 2016-12-29 (×2): qty 3

## 2016-12-29 MED ORDER — ONDANSETRON 4 MG PO TBDP
4.0000 mg | ORAL_TABLET | Freq: Three times a day (TID) | ORAL | Status: DC | PRN
  Administered 2017-01-03: 4 mg via ORAL
  Filled 2016-12-29: qty 1

## 2016-12-29 MED ORDER — CETIRIZINE HCL 10 MG PO TABS
10.0000 mg | ORAL_TABLET | Freq: Every day | ORAL | Status: DC
  Administered 2016-12-29 – 2017-01-05 (×8): 10 mg via ORAL
  Filled 2016-12-29 (×10): qty 1

## 2016-12-29 MED ORDER — SERTRALINE HCL 100 MG PO TABS
100.0000 mg | ORAL_TABLET | Freq: Every day | ORAL | Status: DC
  Administered 2016-12-30 – 2017-01-01 (×3): 100 mg via ORAL
  Filled 2016-12-29 (×4): qty 1
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
## 2016-12-29 NOTE — Progress Notes (Addendum)
Nursing Progress Note: 7p-7a D: Pt currently presents with a anxious/depressed affect and behavior. Pt states "I have had a time trying to keep my anger and anxiety in check. 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 I just realized I need help." Interacting minimally with the milieu. Pt reports off and on sleep during the previous night with current medication regimen. Pt did not attend wrap-up group.  A: Pt provided with medications per providers orders. Pt's labs and vitals were monitored throughout the night. Pt supported emotionally and encouraged to express concerns and questions. Pt educated on medications.  R: Pt's safety ensured with 15 minute and environmental checks. Pt currently denies HI and AVH and endorses intermittent thoughts of SI. Pt verbally contracts to seek staff if SI,HI, or AVH occurs and to consult with staff before acting on any harmful thoughts. Will continue to monitor.

## 2016-12-29 NOTE — BHH Group Notes (Signed)
Pt attended and participated in group and rated their day a 4/10. Pt goal was to "not feel split apart". Pt is still having anxiety, works Scientist, research (medical) and its stressful right now during holiday time. Pt also does not have full support their mother.

## 2016-12-29 NOTE — Plan of Care (Signed)
Pt is taking medications as prescribed. She plans to talk with MD today about medication adjustments.

## 2016-12-29 NOTE — H&P (Signed)
Psychiatric Admission Assessment Adult  Patient Identification: [name] MRN:  [id number] Date of Evaluation:  12/29/2016 Chief Complaint: " I started getting depressed again" Principal Diagnosis: MDD, no psychotic features  Diagnosis:   Patient Active Problem List   Diagnosis Date Noted  . MDD (major depressive disorder), recurrent severe, without psychosis ([place]) [F33.2] 12/28/2016  . Muscle strain [T14.8XXA] 09/19/2016  . Right foot injury, subsequent encounter [S99.921D] 08/25/2016  . Low back pain [M54.5] 06/27/2016  . Left wrist injury, subsequent encounter [S69.92XD] 06/27/2016  . Pain of left thumb [M79.645] 10/07/2015  . 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 Strain of left thumb [IMO0002] 02/11/2015  . Strain of right forearm [S56.911A] 02/11/2015  . Right shoulder pain [M25.511] 12/31/2014  . Injury of right little finger [S69.91XA] 12/31/2014  . Episodic cluster headache, not intractable [G44.019] 12/02/2014  . 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 Hypersomnia, recurrent [G47.13] 12/02/2014  . 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 Lower back injury [S39.92XA] 09/30/2014  . Bipolar I disorder, most recent episode depressed ([place]) [F31.30]   . MDD (major depressive disorder), recurrent, severe, with psychosis ([place]) [F33.3] 09/23/2014  . Injury of left index finger [S69.92XA] 08/20/2014  . Right ankle sprain [S93.401A] 06/01/2014  . Contusion, multiple sites [T07.XXXA] 06/01/2014  . Strain of right gastrocnemius muscle [S86.111A] 06/01/2014  . Phonophobia [F40.298] 05/04/2014  . Photophobia of both eyes [H53.143] 05/04/2014  . Emotionally unstable borderline personality disorder ([place]) [F60.3] 05/04/2014  . Nausea with vomiting [R11.2] 05/04/2014  . Mixed bipolar I disorder ([place]) [F31.60]   . Bipolar I disorder, most  recent episode mixed ([place]) [F31.60] 04/18/2014  . Bipolar affective disorder, depressed, mild ([place]) [F31.31] 04/12/2014  . Suicidal ideation [R45.851] 04/12/2014  . Injury of right shoulder and upper arm [S49.91XA] 02/17/2014  . Left leg pain [M79.605] 06/03/2013  . Right hip pain [M25.551] 06/03/2013  . Migraine with status migrainosus [G43.901] 01/08/2013  . Personality disorder ([place]) [F60.9]   . Chronic migraine [G43.709] 05/08/2012  . Contact dermatitis [L25.9] 11/27/2011  . Major depressive disorder, recurrent episode ([place]) [F33.9] 10/27/2011  . Generalized anxiety disorder [F41.1] 10/27/2011  . 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 Borderline personality disorder ([place]) [F60.3] 10/27/2011  . Right foot pain [M79.671] 09/28/2011  . 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 Ganglion cyst [M67.40] 09/29/2009  . 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 BIPOLAR DISORDER UNSPECIFIED [F31.9] 06/09/2008   History of Present Illness: 46 year old female, known to [place] from prior admissions . She has been diagnosed with Bipolar Disorder, and has had prior psychiatric admissions, most recently in June 2018. At the time she was admitted for depression, suicidal ideations. She was discharged on Lamictal, Latuda, Depakote, Zoloft, Trazodone. States she has been taking medications regularly. She states " I was doing OK, but over the last few weeks I just started getting depressed again". She has been isolating, less sociable than normal, and has developed suicidal ideations of stabbing self .  In addition to depression, describes increased anxiety, states " I feel so anxious I start shaking". She has had to leave work early on a few occasions due to her depression and anxiety. She endorses neuro-vegetative symptoms as below Associated Signs/Symptoms: Depression Symptoms:  depressed  mood, anhedonia, suicidal thoughts with specific plan, anxiety, loss of energy/fatigue, (Hypo)  Manic Symptoms:  None currently  Anxiety Symptoms:  Increased anxiety, apprehension over recent weeks . Psychotic Symptoms: denies  PTSD Symptoms: Does not endorse Total Time spent with patient: 45 minutes  Past Psychiatric History: history of multiple  prior psychiatric admissions. In the past she has been diagnosed with Bipolar Disorder and Borderline Personality Disorder in the past .Reports history of depression and history of brief episodes of increased irritability, spending , energy level.  History of suicide attempt by walking in front of a car in 2010.No history of psychosis.  Is the patient at risk to self? Yes.    Has the patient been a risk to self in the past 6 months? Yes.    Has the patient been a risk to self within the distant past? Yes.    Is the patient a risk to others? No.  Has the patient been a risk to others in the past 6 months? No.  Has the patient been a risk to others within the distant past? No.   Prior Inpatient Therapy: Prior Inpatient Therapy: Yes Prior Therapy Dates: multiple-last 06/2016 Prior Therapy Facilty/Provider(s): [facility] Reason for Treatment: depression/SI Prior Outpatient Therapy: Prior Outpatient Therapy: Yes Prior Therapy Dates: current Prior Therapy Facilty/Provider(s): [facility] Reason for Treatment: depression Does patient have an ACCT team?: No Does patient have Intensive In-House Services?  : No Does patient have [facility] services? : No Does patient have P4CC services?: No  Alcohol Screening: 1. How often do you have a drink containing alcohol?: Never 2. How many drinks containing alcohol do you have on a typical day when you are drinking?: 1 or 2 3. How often do you have six or more drinks on one occasion?: Never AUDIT-C Score: 0 4. How often during the last year have you found that you were not able to stop drinking once you had  started?: Never 5. How often during the last year have you failed to do what was normally expected from you becasue of drinking?: Never 6. How often during the last year have you needed a first drink in the morning to get yourself going after a heavy drinking session?: Never 7. How often during the last year have you had a feeling of guilt of remorse after drinking?: Never 8. How often during the last year have you been unable to remember what happened the night before because you had been drinking?: Never 9. Have you or someone else been injured as a result of your drinking?: No 10. Has a relative or friend or a doctor or another health worker been concerned about your drinking or suggested you cut down?: No Alcohol Use Disorder Identification Test Final Score (AUDIT): 0 Intervention/Follow-up: AUDIT Score <7 follow-up not indicated Substance Abuse History in the last 12 months:  Denies alcohol or drug abuse  Consequences of Substance Abuse: Denies  Previous Psychotropic Medications: Depakote ER 1500 mgrs QHS, Lamictal 200 mgrs QDAY, Zoloft 50 mgrs QDAY , Latuda 80 mgrs QDAY , Trazodone 50 mgrs QHS PRN  Psychological Evaluations: No  Past Medical History:  Past Medical History:  Diagnosis Date  . Achilles tendinitis   . Achilles tendinitis   . ADHD (attention deficit hyperactivity disorder)   . Allergy   . Arthritis   . Bipolar affective ([place])   . Bipolar disorder ([place])   . Cataracts, bilateral   . Eczema   . Ganglion cyst 09/29/2009   left wrist (2 cyst)  . Hyperprolactinemia ([place])   . Hypertension   . Lipoma   .  Migraine   . Personality disorder ([place])   . Pes planus     Past Surgical History:  Procedure Laterality Date  . ANKLE SURGERY  12/88   left   . chest nodule  1990?   rt chest wall nodule removal  . GANGLION CYST EXCISION  2011  . lipoma removal    . right bunioectomy    . SHOULDER SURGERY  01/13/2011   right, partial tear  . tumor resection left thigh      Family History: lives with mother,has one brother.  Family History  Problem Relation Age of Onset  . Hypertension Mother   . Hyperlipidemia Mother   . Heart attack Father   . Heart disease Father   . Hypertension Father   . Bipolar disorder Father   . Diabetes Paternal Grandfather   . Heart disease Maternal Aunt   . Breast cancer Maternal Aunt   . Heart disease Maternal Grandmother   . Cancer Maternal Grandmother        colon   Family Psychiatric  History:parents are alive, father is in a [place], has history of Bipolar Disorder, no suicides in family. No history of substance abuse in family . Tobacco Screening: Have you used any form of tobacco in the last 30 days? (Cigarettes, Smokeless Tobacco, Cigars, and/or Pipes): No Social History: single, no children, lives with mother, employed at a retail store  Social History   Substance and Sexual Activity  Alcohol Use No  . Alcohol/week: 0.0 oz     Social History   Substance and Sexual Activity  Drug Use No    Additional Social History: Marital status: Single    Pain Medications: please see mar Prescriptions: please see mar Over the Counter: please see mar History of alcohol / drug use?: No history of alcohol / drug abuse Longest period of sobriety (when/how long): NA  Allergies:   Allergies  Allergen Reactions  . Adhesive [Tape] Itching and Rash    Also reacted to Steri Strips and Band-Aids.  . Dilaudid [Hydromorphone Hcl] Itching  . Morphine Nausea And Vomiting  . Penicillins Hives    Has patient had a PCN reaction causing immediate rash, facial/tongue/throat swelling, SOB or lightheadedness with hypotension: YES Has patient had a PCN reaction causing severe rash involving mucus membranes or skin necrosis: NO Has patient had a PCN reaction that required hospitalization NO Has patient had a PCN reaction occurring within the last 10 years:NO If all of the above answers are "NO", then may proceed with  Cephalosporin use.  Marland Kitchen Percocet [Oxycodone-Acetaminophen] Itching  . Prednisone Hives  . Provera [Medroxyprogesterone Acetate] Other (See Comments)    Causes manic episodes  . Ultram [Tramadol Hcl] Itching   Lab Results:  Results for orders placed or performed during the hospital encounter of 12/28/16 (from the past 48 hour(s))  Urinalysis, Complete w Microscopic     Status: Abnormal   Collection Time: 12/29/16  6:16 AM  Result Value Ref Range   Color, Urine STRAW (A) YELLOW   APPearance CLEAR CLEAR   Specific Gravity, Urine 1.009 1.005 - 1.030   pH 7.0 5.0 - 8.0   Glucose, UA NEGATIVE NEGATIVE mg/dL   Hgb urine dipstick NEGATIVE NEGATIVE   Bilirubin Urine NEGATIVE NEGATIVE   Ketones, ur NEGATIVE NEGATIVE mg/dL   Protein, ur NEGATIVE NEGATIVE mg/dL   Nitrite NEGATIVE NEGATIVE   Leukocytes, UA NEGATIVE NEGATIVE   RBC / HPF 0-5 0 - 5 RBC/hpf   WBC, UA 0-5 0 -  5 WBC/hpf   Bacteria, UA NONE SEEN NONE SEEN   Squamous Epithelial / LPF 0-5 (A) NONE SEEN    Comment: Performed at [facility], [place] [address] [place], [place] [address]  Pregnancy, urine     Status: None   Collection Time: 12/29/16  6:16 AM  Result Value Ref Range   Preg Test, Ur NEGATIVE NEGATIVE    Comment:        THE SENSITIVITY OF THIS METHODOLOGY IS >20 mIU/mL. Performed at [facility], [place] [address], [place], [place] [address]   Urine rapid drug screen (hosp performed)not at [facility]     Status: None   Collection Time: 12/29/16  6:16 AM  Result Value Ref Range   Opiates NONE DETECTED NONE DETECTED   Cocaine NONE DETECTED NONE DETECTED   Benzodiazepines NONE DETECTED NONE DETECTED   Amphetamines NONE DETECTED NONE DETECTED   Tetrahydrocannabinol NONE DETECTED NONE DETECTED   Barbiturates NONE DETECTED NONE DETECTED    Comment:        DRUG SCREEN FOR MEDICAL PURPOSES ONLY.  IF CONFIRMATION IS NEEDED FOR ANY PURPOSE, NOTIFY LAB WITHIN 5 DAYS.        LOWEST DETECTABLE  LIMITS FOR URINE DRUG SCREEN Drug Class       Cutoff (ng/mL) Amphetamine      1000 Barbiturate      200 Benzodiazepine   628 Tricyclics       315 Opiates          300 Cocaine          300 THC              50 Performed at [facility], [place] [address] [place], [place] [address]   CBC     Status: Abnormal   Collection Time: 12/29/16  6:27 AM  Result Value Ref Range   WBC 7.4 4.0 - 10.5 K/uL   RBC 4.75 3.87 - 5.11 MIL/uL   Hemoglobin 15.2 (H) 12.0 - 15.0 g/dL   HCT 44.6 36.0 - 46.0 %   MCV 93.9 78.0 - 100.0 fL   MCH 32.0 26.0 - 34.0 pg   MCHC 34.1 30.0 - 36.0 g/dL   RDW 12.8 11.5 - 15.5 %   Platelets 241 150 - 400 K/uL    Comment: Performed at [facility], [place] [address] [place], [place] [address]  Comprehensive metabolic panel     Status: Abnormal   Collection Time: 12/29/16  6:27 AM  Result Value Ref Range   Sodium 140 135 - 145 mmol/L   Potassium 3.9 3.5 - 5.1 mmol/L   Chloride 104 101 - 111 mmol/L   CO2 28 22 - 32 mmol/L   Glucose, Bld 88 65 - 99 mg/dL   BUN 16 6 - 20 mg/dL   Creatinine, Ser 1.09 (H) 0.44 - 1.00 mg/dL   Calcium 9.5 8.9 - 10.3 mg/dL   Total Protein 6.3 (L) 6.5 - 8.1 g/dL   Albumin 3.8 3.5 - 5.0 g/dL   AST 27 15 - 41 U/L   ALT 14 14 - 54 U/L   Alkaline Phosphatase 60 38 - 126 U/L   Total Bilirubin 0.6 0.3 - 1.2 mg/dL   GFR calc non Af Amer 59 (L) >60 mL/min   GFR calc Af Amer >60 >60 mL/min    Comment: (NOTE) The eGFR has been calculated using the CKD EPI equation. This calculation has not been validated in all clinical situations. eGFR's  persistently <60 mL/min signify possible Chronic Kidney Disease.    Anion gap 8 5 - 15    Comment: Performed at [facility], [place] [address], [place], [place] [address]  Hemoglobin A1c     Status: None   Collection Time: 12/29/16  6:27 AM  Result Value Ref Range   Hgb A1c MFr Bld 5.1 4.8 - 5.6 %    Comment: (NOTE) Pre diabetes:           5.7%-6.4% Diabetes:              >6.4% Glycemic control for   <7.0% adults with diabetes    Mean Plasma Glucose 99.67 mg/dL    Comment: Performed at [place] [address], [place], [place] [address]  Ethanol     Status: None   Collection Time: 12/29/16  6:27 AM  Result Value Ref Range   Alcohol, Ethyl (B) <10 <10 mg/dL    Comment:        LOWEST DETECTABLE LIMIT FOR SERUM ALCOHOL IS 10 mg/dL FOR MEDICAL PURPOSES ONLY Performed at [facility], [place] [address], [place], [place] [address]   Lipid panel     Status: Abnormal   Collection Time: 12/29/16  6:27 AM  Result Value Ref Range   Cholesterol 198 0 - 200 mg/dL   Triglycerides 136 <150 mg/dL   HDL 53 >40 mg/dL   Total CHOL/HDL Ratio 3.7 RATIO   VLDL 27 0 - 40 mg/dL   LDL Cholesterol 118 (H) 0 - 99 mg/dL    Comment:        Total Cholesterol/HDL:CHD Risk Coronary Heart Disease Risk Table                     Men   Women  1/2 Average Risk   3.4   3.3  Average Risk       5.0   4.4  2 X Average Risk   9.6   7.1  3 X Average Risk  23.4   11.0        Use the calculated Patient Ratio above and the CHD Risk Table to determine the patient's CHD Risk.        ATP III CLASSIFICATION (LDL):  <100     mg/dL   Optimal  100-129  mg/dL   Near or Above                    Optimal  130-159  mg/dL   Borderline  160-189  mg/dL   High  >190     mg/dL   Very High Performed at [place] [address] [place], [place] [address]   TSH     Status: None   Collection Time: 12/29/16  6:27 AM  Result Value Ref Range   TSH 4.288 0.350 - 4.500 uIU/mL    Comment: Performed by a 3rd Generation assay with a functional sensitivity of <=0.01 uIU/mL. Performed at [facility] [place] [address], [place], [place] [address]   Valproic acid level     Status: None   Collection Time: 12/29/16  6:27 AM  Result Value Ref Range   Valproic Acid Lvl 73 50.0 - 100.0 ug/mL    Comment: Performed at  [facility], [place] [address], [place], [place] [address]    Blood Alcohol level:  Lab Results  Component Value Date   ETH <10 12/29/2016   ETH <  5 50/93/2671    Metabolic Disorder Labs:  Lab Results  Component Value Date   HGBA1C 5.1 12/29/2016   MPG 99.67 12/29/2016   MPG 100 07/19/2016   Lab Results  Component Value Date   PROLACTIN 52.8 (H) 07/19/2016   PROLACTIN  08/21/2009    14.3 (NOTE)     Reference Ranges:                 Female:                       2.1 -  17.1 ng/ml                 Female:   Pregnant          9.7 - 208.5 ng/mL                           Non Pregnant      2.8 -  29.2 ng/mL                           Post  Menopausal   1.8 -  20.3 ng/mL                     Lab Results  Component Value Date   CHOL 198 12/29/2016   TRIG 136 12/29/2016   HDL 53 12/29/2016   CHOLHDL 3.7 12/29/2016   VLDL 27 12/29/2016   LDLCALC 118 (H) 12/29/2016   LDLCALC 100 (H) 07/19/2016    Current Medications: Current Facility-Administered Medications  Medication Dose Route Frequency Provider Last Rate Last Dose  . alum & mag hydroxide-simeth (MAALOX/MYLANTA) 200-200-20 MG/5ML suspension 30 mL  30 mL Oral Q4H PRN Rankin, Shuvon B, NP      . diclofenac (VOLTAREN) EC tablet 75 mg  75 mg Oral BID Rankin, Shuvon B, NP   75 mg at 12/29/16 0824  . hydrOXYzine (ATARAX/VISTARIL) tablet 50 mg  50 mg Oral TID PRN Rankin, Shuvon B, NP   50 mg at 12/29/16 0840  . lamoTRIgine (LAMICTAL) tablet 200 mg  200 mg Oral QHS Rankin, Shuvon B, NP   200 mg at 12/28/16 2317  . lurasidone (LATUDA) tablet 80 mg  80 mg Oral Q supper Rankin, Shuvon B, NP      . magnesium hydroxide (MILK OF MAGNESIA) suspension 30 mL  30 mL Oral Daily PRN Rankin, Shuvon B, NP      . metoprolol succinate (TOPROL-XL) 24 hr tablet 100 mg  100 mg Oral Daily Rankin, Shuvon B, NP   100 mg at 12/29/16 0823  . sertraline (ZOLOFT) tablet 50 mg  50 mg Oral Daily Rankin, Shuvon B, NP   50 mg at 12/29/16 0823  .  SUMAtriptan (IMITREX) tablet 50 mg  50 mg Oral Q2H PRN Lindon Romp A, NP   50 mg at 12/28/16 2350  . traZODone (DESYREL) tablet 50 mg  50 mg Oral QHS PRN Rankin, Shuvon B, NP   50 mg at 12/28/16 2317   PTA Medications: Medications Prior to Admission  Medication Sig Dispense Refill Last Dose  . clobetasol cream (TEMOVATE) 0.05 % APPLY TO AFFFECTED AREA UP TO 2 TIMES A DAY AS NEEDED (NOT TO FACE, GROIN OR AXILLA)  0 Taking  . diclofenac (VOLTAREN) 75 MG EC tablet take 1 tablet by mouth twice a day 60 tablet 1 Taking  . divalproex (DEPAKOTE ER)  500 MG 24 hr tablet take 3 tablets by mouth at bedtime 90 tablet 0 Taking  . hydrOXYzine (ATARAX/VISTARIL) 50 MG tablet Take 1 tablet (50 mg total) by mouth 3 (three) times daily as needed for anxiety. 60 tablet 0 Taking  . hydrOXYzine (VISTARIL) 50 MG capsule   0 Taking  . lamoTRIgine (LAMICTAL) 200 MG tablet Take 1 tablet (200 mg total) by mouth at bedtime. 30 tablet 0 Taking  . levocetirizine (XYZAL) 5 MG tablet Take 5 mg every evening by mouth.  0 Taking  . lurasidone (LATUDA) 80 MG TABS tablet Take 1 tablet (80 mg total) by mouth daily with supper. 30 tablet 0 Taking  . metaxalone (SKELAXIN) 800 MG tablet   0 Taking  . metoprolol succinate (TOPROL-XL) 100 MG 24 hr tablet Take 1 tablet (100 mg total) by mouth daily. Take with or immediately following a meal. 30 tablet 0 Taking  . sertraline (ZOLOFT) 50 MG tablet Take 1 tablet (50 mg total) by mouth daily. 30 tablet 0 Taking  . SUMAtriptan 6 MG/0.5ML SOAJ   0 Taking  . traZODone (DESYREL) 50 MG tablet Take 1 tablet (50 mg total) by mouth at bedtime as needed for sleep. 30 tablet 0 Taking    Musculoskeletal: Strength & Muscle Tone: within normal limits Gait & Station: normal Patient leans: N/A  Psychiatric Specialty Exam: Physical Exam  Review of Systems  Constitutional: Negative.   HENT: Negative.   Eyes: Negative.   Respiratory: Negative.   Cardiovascular: Negative.   Gastrointestinal:  Negative.   Genitourinary: Negative.   Musculoskeletal:       Left hand pain after trauma several weeks ago- was using brace at home  Skin: Negative.   Neurological: Negative.   Endo/Heme/Allergies: Negative.   Psychiatric/Behavioral: Positive for depression. The patient is nervous/anxious.   All other systems reviewed and are negative.   Blood pressure 119/78, pulse 78, temperature 97.8 F (36.6 C), temperature source Oral, resp. rate 16, height 5' 10"  (1.778 m), weight 124.7 kg (275 lb), last menstrual period 12/23/2016, SpO2 100 %.Body mass index is 39.46 kg/m.  General Appearance: Well Groomed  Eye Contact:  Good  Speech:  Normal Rate  Volume:  Normal  Mood:  Depressed  Affect:  constricted and slightly anxious   Thought Process:  Linear and Descriptions of Associations: Intact  Orientation:  Full (Time, Place, and Person)  Thought Content:  no hallucinations, no delusions, not internally preoccupied   Suicidal Thoughts:  No denies any suicidal plan or intention at this time and contracts for safety, denies homicidal or violent ideations  Homicidal Thoughts:  No  Memory:  recent and remote grossly intact   Judgement:  Fair  Insight:  Present  Psychomotor Activity:  Normal  Concentration:  Concentration: Good and Attention Span: Good  Recall:  Good  Fund of Knowledge:  Good  Language:  Good  Akathisia:  Negative  Handed:  Right  AIMS (if indicated):     Assets:  Communication Skills Desire for Improvement Resilience  ADL's:  Intact  Cognition:  WNL  Sleep:       Treatment Plan Summary: Daily contact with patient to assess and evaluate symptoms and progress in treatment, Medication management, Plan as tolerated  and regular  Observation Level/Precautions:  15 minute checks  Laboratory:  as needed   Psychotherapy: milieu, group therapy     Medications:  Patient reports she has been on Depakote for years, initially started by Neurologist for migraine prophylaxis,  feels it  is well tolerated and helps her mood disorder. Repeat Valproic Acid level is therapeutic ( 73) Regarding Zoloft, states she has tolerated this medication well and denies side effects For mood disorder/bipolar disorder  Continue Depakote ER 1500 mgrs QHS Continue Latuda 80 mgrs QDAY  Continue Lamictal 200 mgrs QDAY  For depression/anxiety Increase Zoloft to 100 mgrs QDAY    Consultations:  As needed   Discharge Concerns:  -  Estimated LOS: 5 days   Other:     Physician Treatment Plan for Primary Diagnosis:  Bipolar Disorder, Depressed  Long Term Goal(s): Improvement in symptoms so as ready for discharge  Short Term Goals: Ability to identify changes in lifestyle to reduce recurrence of condition will improve and Ability to maintain clinical measurements within normal limits will improve  Physician Treatment Plan for Secondary Diagnosis: Active Problems:   MDD (major depressive disorder), recurrent severe, without psychosis ([place])  Long Term Goal(s): Improvement in symptoms so as ready for discharge  Short Term Goals: Ability to verbalize feelings will improve, Ability to disclose and discuss suicidal ideas, Ability to demonstrate self-control will improve and Ability to identify and develop effective coping behaviors will improve  I certify that inpatient services furnished can reasonably be expected to improve the patient's condition.    Jenne Campus, MD 11/30/201811:55 AM

## 2016-12-29 NOTE — Tx Team (Signed)
Interdisciplinary Treatment and Diagnostic Plan Update  12/29/2016 Time of Session: 0830AM [name] MRN: [id number]  Principal Diagnosis: MDD, recurrent, severe, without psychosis  Secondary Diagnoses: Active Problems:   MDD (major depressive disorder), recurrent severe, without psychosis ([place])   Current Medications:  Current Facility-Administered Medications  Medication Dose Route Frequency Provider Last Rate Last Dose  . alum & mag hydroxide-simeth (MAALOX/MYLANTA) 200-200-20 MG/5ML suspension 30 mL  30 mL Oral Q4H PRN Rankin, Shuvon B, NP      . diclofenac (VOLTAREN) EC tablet 75 mg  75 mg Oral BID Rankin, Shuvon B, NP   75 mg at 12/29/16 0824  . hydrOXYzine (ATARAX/VISTARIL) tablet 50 mg  50 mg Oral TID PRN Rankin, Shuvon B, NP   50 mg at 12/29/16 0840  . lamoTRIgine (LAMICTAL) tablet 200 mg  200 mg Oral QHS Rankin, Shuvon B, NP   200 mg at 12/28/16 2317  . lurasidone (LATUDA) tablet 80 mg  80 mg Oral Q supper Rankin, Shuvon B, NP      . magnesium hydroxide (MILK OF MAGNESIA) suspension 30 mL  30 mL Oral Daily PRN Rankin, Shuvon B, NP      . metoprolol succinate (TOPROL-XL) 24 hr tablet 100 mg  100 mg Oral Daily Rankin, Shuvon B, NP   100 mg at 12/29/16 0823  . sertraline (ZOLOFT) tablet 50 mg  50 mg Oral Daily Rankin, Shuvon B, NP   50 mg at 12/29/16 0823  . SUMAtriptan (IMITREX) tablet 50 mg  50 mg Oral Q2H PRN Lindon Romp A, NP   50 mg at 12/28/16 2350  . traZODone (DESYREL) tablet 50 mg  50 mg Oral QHS PRN Rankin, Shuvon B, NP   50 mg at 12/28/16 2317   PTA Medications: Medications Prior to Admission  Medication Sig Dispense Refill Last Dose  . clobetasol cream (TEMOVATE) 0.05 % APPLY TO AFFFECTED AREA UP TO 2 TIMES A DAY AS NEEDED (NOT TO FACE, GROIN OR AXILLA)  0 Taking  . diclofenac (VOLTAREN) 75 MG EC tablet take 1 tablet by mouth twice a day 60 tablet 1 Taking  . divalproex (DEPAKOTE ER) 500 MG 24 hr tablet take 3 tablets by mouth at bedtime 90 tablet 0 Taking  .  hydrOXYzine (ATARAX/VISTARIL) 50 MG tablet Take 1 tablet (50 mg total) by mouth 3 (three) times daily as needed for anxiety. 60 tablet 0 Taking  . hydrOXYzine (VISTARIL) 50 MG capsule   0 Taking  . lamoTRIgine (LAMICTAL) 200 MG tablet Take 1 tablet (200 mg total) by mouth at bedtime. 30 tablet 0 Taking  . levocetirizine (XYZAL) 5 MG tablet Take 5 mg every evening by mouth.  0 Taking  . lurasidone (LATUDA) 80 MG TABS tablet Take 1 tablet (80 mg total) by mouth daily with supper. 30 tablet 0 Taking  . metaxalone (SKELAXIN) 800 MG tablet   0 Taking  . metoprolol succinate (TOPROL-XL) 100 MG 24 hr tablet Take 1 tablet (100 mg total) by mouth daily. Take with or immediately following a meal. 30 tablet 0 Taking  . sertraline (ZOLOFT) 50 MG tablet Take 1 tablet (50 mg total) by mouth daily. 30 tablet 0 Taking  . SUMAtriptan 6 MG/0.5ML SOAJ   0 Taking  . traZODone (DESYREL) 50 MG tablet Take 1 tablet (50 mg total) by mouth at bedtime as needed for sleep. 30 tablet 0 Taking    Patient Stressors: Financial difficulties Occupational concerns  Patient Strengths: Average or above average intelligence Communication skills General fund of  knowledge Motivation for treatment/growth Physical Health Supportive family/friends  Treatment Modalities: Medication Management, Group therapy, Case management,  1 to 1 session with clinician, Psychoeducation, Recreational therapy.   Physician Treatment Plan for Primary Diagnosis: MDD, recurrent, severe, without psychosis  Medication Management: Evaluate patient's response, side effects, and tolerance of medication regimen.  Therapeutic Interventions: 1 to 1 sessions, Unit Group sessions and Medication administration.  Evaluation of Outcomes: Progressing  Physician Treatment Plan for Secondary Diagnosis: Active Problems:   MDD (major depressive disorder), recurrent severe, without psychosis ([place])  Long Term Goal(s):     Short Term Goals:       Medication  Management: Evaluate patient's response, side effects, and tolerance of medication regimen.  Therapeutic Interventions: 1 to 1 sessions, Unit Group sessions and Medication administration.  Evaluation of Outcomes: Progressing   RN Treatment Plan for Primary Diagnosis: MDD, recurrent, severe, without psychosis Long Term Goal(s): Knowledge of disease and therapeutic regimen to maintain health will improve  Short Term Goals: Ability to remain free from injury will improve, Ability to demonstrate self-control and Ability to disclose and discuss suicidal ideas  Medication Management: RN will administer medications as ordered by provider, will assess and evaluate patient's response and provide education to patient for prescribed medication. RN will report any adverse and/or side effects to prescribing provider.  Therapeutic Interventions: 1 on 1 counseling sessions, Psychoeducation, Medication administration, Evaluate responses to treatment, Monitor vital signs and CBGs as ordered, Perform/monitor CIWA, COWS, AIMS and Fall Risk screenings as ordered, Perform wound care treatments as ordered.  Evaluation of Outcomes: Progressing   LCSW Treatment Plan for Primary Diagnosis: MDD, recurrent, severe, without psychosis Long Term Goal(s): Safe transition to appropriate next level of care at discharge, Engage patient in therapeutic group addressing interpersonal concerns.  Short Term Goals: Engage patient in aftercare planning with referrals and resources, Facilitate patient progression through stages of change regarding substance use diagnoses and concerns and Increase skills for wellness and recovery  Therapeutic Interventions: Assess for all discharge needs, 1 to 1 time with Social worker, Explore available resources and support systems, Assess for adequacy in community support network, Educate family and significant other(s) on suicide prevention, Complete Psychosocial Assessment, Interpersonal group  therapy.  Evaluation of Outcomes: Progressing   Progress in Treatment: Attending groups: No. New to unit. Continuing to assess.  Participating in groups: No. Taking medication as prescribed: Yes. Toleration medication: Yes. Family/Significant other contact made: No, will contact:  family member if patient consents  Patient understands diagnosis: Yes. Discussing patient identified problems/goals with staff: Yes. Medical problems stabilized or resolved: Yes. Denies suicidal/homicidal ideation: Yes. Issues/concerns per patient self-inventory: No. Other: n/a   New problem(s) identified: No, Describe:  n/a  New Short Term/Long Term Goal(s): elimination of SI thoughts; medication management for mood stabilization; development of comprehensive mental wellness plan.   Discharge Plan or Barriers: CSW assessing for appropriate referrals. Pt was last admitted to [facility] 06/2016. She lives with her mother and works p/t at [employer]. Pt goes to [facility] for medication management with Pauline Good NP and [facility] for therapy with Ruben Reason. Last admission, she was also referred to [place] [facility].   Reason for Continuation of Hospitalization: Aggression Anxiety Depression Medication stabilization Suicidal ideation  Estimated Length of Stay: Tuesday, 01/02/17  Attendees: Patient: 12/29/2016 10:27 AM  Physician: Dr. Parke Poisson MD; Dr. Nancy Fetter MD 12/29/2016 10:27 AM  Nursing: Duwaine Maxin RN 12/29/2016 10:27 AM  RN Care Manager: Lars Pinks CM 12/29/2016 10:27 AM  Social Worker:  [place], LCSW 12/29/2016 10:27 AM  Recreational Therapist: x 12/29/2016 10:27 AM  Other: Lindell Spar NP; Darnelle Maffucci Money NP 12/29/2016 10:27 AM  Other:  12/29/2016 10:27 AM  Other: 12/29/2016 10:27 AM    Scribe for Treatment Team: [place], LCSW 12/29/2016 10:27 AM

## 2016-12-29 NOTE — Telephone Encounter (Signed)
Referral sent to imaging center at [place]

## 2016-12-29 NOTE — Progress Notes (Signed)
Recreation Therapy Notes  Date: 12/29/16 Time: 0930 Location: 300 Hall Dayroom  Group Topic: Stress Management  Goal Area(s) Addresses:  Patient will verbalize importance of using healthy stress management.  Patient will identify positive emotions associated with healthy stress management.   Behavioral Response: Engaged  Intervention: Stress Management  Activity :  Progressive Muscle Relaxation.  LRT introduced the stress management technique of progressive muscle relaxation.  LRT read a script to allow patients to tense and relax each muscle group one at a time.  Education:  Stress Management, Discharge Planning.   Education Outcome: Acknowledges edcuation/In group clarification offered/Needs additional education  Clinical Observations/Feedback: Pt attended group.    Victorino Sparrow, LRT/CTRS         Victorino Sparrow A 12/29/2016 12:22 PM

## 2016-12-29 NOTE — Progress Notes (Signed)
D:Pt continues to report si and racing thoughts. She c/o increased anxiety. Pt has been out in the dayroom this morning interacting. A:Offered support, encouragement and 15 minute checks. Gave prn medication for anxiety. R:Pt contracts with staff for safety. Safety maintained on the unit.

## 2016-12-29 NOTE — Progress Notes (Signed)
Pt did not attend group. 

## 2016-12-29 NOTE — BHH Suicide Risk Assessment (Signed)
[facility] Admission Suicide Risk Assessment   Nursing information obtained from:  Patient Demographic factors:  Caucasian Current Mental Status:  Suicidal ideation indicated by patient Loss Factors:  Financial problems / change in socioeconomic status Historical Factors:  Prior suicide attempts, Family history of mental illness or substance abuse Risk Reduction Factors:  Employed, Living with another person, especially a relative  Total Time spent with patient: 45 minutes Principal Problem:  Bipolar Disorder , Depressed  Diagnosis:   Patient Active Problem List   Diagnosis Date Noted  . MDD (major depressive disorder), recurrent severe, without psychosis ([place]) [F33.2] 12/28/2016  . Muscle strain [T14.8XXA] 09/19/2016  . Right foot injury, subsequent encounter [S99.921D] 08/25/2016  . Low back pain [M54.5] 06/27/2016  . Left wrist injury, subsequent encounter [S69.92XD] 06/27/2016  . Pain of left thumb [M79.645] 10/07/2015  . 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 Strain of left thumb [IMO0002] 02/11/2015  . Strain of right forearm [S56.911A] 02/11/2015  . Right shoulder pain [M25.511] 12/31/2014  . Injury of right little finger [S69.91XA] 12/31/2014  . Episodic cluster headache, not intractable [G44.019] 12/02/2014  . 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 Hypersomnia, recurrent [G47.13] 12/02/2014  . 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 Lower back injury [S39.92XA] 09/30/2014  . Bipolar I disorder, most recent episode depressed ([place]) [F31.30]   . MDD (major depressive disorder), recurrent, severe, with psychosis ([employer]) [F33.3] 09/23/2014  . Injury of left index finger [S69.92XA] 08/20/2014  . Right ankle sprain [S93.401A] 06/01/2014  . Contusion, multiple sites [T07.XXXA] 06/01/2014  . Strain of right gastrocnemius muscle [S86.111A]  06/01/2014  . Phonophobia [F40.298] 05/04/2014  . Photophobia of both eyes [H53.143] 05/04/2014  . Emotionally unstable borderline personality disorder ([place]) [F60.3] 05/04/2014  . Nausea with vomiting [R11.2] 05/04/2014  . Mixed bipolar I disorder ([place]) [F31.60]   . Bipolar I disorder, most recent episode mixed ([place]) [F31.60] 04/18/2014  . Bipolar affective disorder, depressed, mild ([place]) [F31.31] 04/12/2014  . Suicidal ideation [R45.851] 04/12/2014  . Injury of right shoulder and upper arm [S49.91XA] 02/17/2014  . Left leg pain [M79.605] 06/03/2013  . Right hip pain [M25.551] 06/03/2013  . Migraine with status migrainosus [G43.901] 01/08/2013  . Personality disorder ([place]) [F60.9]   . Chronic migraine [G43.709] 05/08/2012  . Contact dermatitis [L25.9] 11/27/2011  . Major depressive disorder, recurrent episode ([place]) [F33.9] 10/27/2011  . Generalized anxiety disorder [F41.1] 10/27/2011  . 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 Borderline personality disorder ([place]) [F60.3] 10/27/2011  . Right foot pain [M79.671] 09/28/2011  . 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 Ganglion cyst [M67.40] 09/29/2009  . 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 BIPOLAR DISORDER UNSPECIFIED [F31.9] 06/09/2008    Continued Clinical Symptoms:  Alcohol Use Disorder Identification Test Final Score (AUDIT): 0 The "Alcohol Use Disorders Identification Test", Guidelines for Use in Primary Care, Second Edition.  World Pharmacologist [facility]). Score between 0-7:  no or low risk or alcohol related problems. Score between 8-15:  moderate risk of alcohol related problems. Score between 16-19:  high risk of alcohol related problems. Score 20 or above:  warrants further diagnostic evaluation for alcohol dependence and treatment.   CLINICAL FACTORS:  46 year old single female, employed, lives with mother,  history of Bipolar Disorder, presents due to worsening depression and suicidal ideations  Psychiatric Specialty Exam: Physical Exam  ROS  Blood pressure 119/78, pulse 78, temperature 97.8 F (36.6 C), temperature source Oral, resp. rate 16, height 5\' 10"  (1.778 m), weight 124.7 kg (275 lb), last menstrual period 12/23/2016, SpO2 100 %.Body mass index is 39.46 kg/m.   see admit note MSE    COGNITIVE FEATURES THAT CONTRIBUTE TO RISK:  Closed-mindedness and Loss of executive function    SUICIDE RISK:   Moderate:  Frequent suicidal ideation with limited intensity, and duration, some specificity in terms of plans, no associated intent, good self-control, limited dysphoria/symptomatology, some risk factors present, and identifiable protective factors, including available and accessible social support.  PLAN OF CARE: Patient will be admitted to inpatient psychiatric unit for stabilization and safety. Will provide and encourage milieu participation. Provide medication management and maked adjustments as needed.  Will follow daily.    I certify that inpatient services furnished can reasonably be expected to improve the patient's condition.   Jenne Campus, MD 12/29/2016, 1:38 PM

## 2016-12-30 MED ORDER — IBUPROFEN 600 MG PO TABS
ORAL_TABLET | ORAL | Status: AC
  Filled 2016-12-30: qty 1

## 2016-12-30 MED ORDER — IBUPROFEN 600 MG PO TABS
600.0000 mg | ORAL_TABLET | Freq: Four times a day (QID) | ORAL | Status: DC | PRN
  Administered 2016-12-30 – 2017-01-05 (×6): 600 mg via ORAL
  Filled 2016-12-30 (×7): qty 1

## 2016-12-30 NOTE — Plan of Care (Signed)
Patient verbalizes understanding of information, education provided. Would benefit from re-education prn.

## 2016-12-30 NOTE — BHH Group Notes (Signed)
[facility] Group Notes:  (Nursing/MHT/Case Management/Adjunct)  Date:  12/30/2016  Time:  1315  Type of Therapy:  Nurse Education  - Reversing Negative Self Talk  Participation Level:  Active  Participation Quality:  Attentive  Affect:  Flat  Cognitive:  Alert  Insight:  Improving  Engagement in Group:  Engaged  Modes of Intervention:  Discussion, Education and Support  Summary of Progress/Problems:  Patient attended and participated in nursing education group. Techniques on how to reduce and eliminate negative self talk. Patient contributed to the discussion.   Jamie Kato 12/30/2016, 4:36 PM

## 2016-12-30 NOTE — Progress Notes (Addendum)
D: Patient observed up and visible with peers in dayroom. Interacting appropriately. Frequent needs of staff. Patient states "I'm really irritated. I didn't get my depakote the last 2 nights. He said he would order it but they said it doesn't start until tonight. I mean, what kind of sense does that make?" "Everyone - my coworkers, my therapist, my neurologist - told me on Tuesday I needed to come in. But I waited until Thursday. The thought of going back to work right now makes me panic. Dr. Parke Poisson said I might discharge Monday. I don't think I will be ready."  Patient's affect flat, anxious, agitated with labile and irritable mood. Per self inventory and discussions with writer, rates depression at a 7/10, hopelessness at a 5/10 and anxiety at an 8/10. Rates sleep as poor, appetite as fair, energy as normal and concentration as good.  States goal for today is to "take less naps, stay involved. Stay in dayroom and interact with others." Complained of a migraine of a 7/10 this AM, no other physical problems.   A: Medicated per orders, prn imitrex, vistaril given for complaints. Level III obs in place for safety. Emotional support offered and self inventory reviewed. Spoke with patient about need to remain in the moment. Suggested she worry about potential discharge when that time comes. Encouraged completion of Suicide Safety Plan and programming participation.  Fall prevention plan in place and reviewed with patient as pt is a high fall risk due to frequent falls.   R: Patient verbalizes understanding of POC, falls prevention education. On reassess, patient's pain decreased to a 5/10. Remains irritable, angry and anxious. Patient endorses passive SI but denies plan, intent. Verbal contract in place for safety. No HI/AVH and remains safe on level III obs. Will continue to monitor closely and make verbal contact frequently.

## 2016-12-30 NOTE — Progress Notes (Signed)
Writer spoke with patient 1:1 after she woke up this evening. She reported that she did not feel the best and had felt nauseated during shift change. She reported that she felt better now. She inquired about her medications and became upset when she was informed that her depakote would start on tomorrow night. She then began to complain about how this is her second night without it and this helps with her migraines and mood stabilizer. Writer informed her of imetrex available and other prns if needed. She walked off from Probation officer. Safety maintained on unit with 15 min checks.

## 2016-12-30 NOTE — BHH Suicide Risk Assessment (Addendum)
Late Entry for 12/29/16  [place] INPATIENT:  Family/Significant Other Suicide Prevention Education  Suicide Prevention Education:  Education Completed;  mom-Brenda Calvin [phone] has been identified by the patient as the family member/significant other with whom the patient will be residing, and identified as the person(s) who will aid the patient in the event of a mental health crisis (suicidal ideations/suicide attempt).  With written consent from the patient, the family member/significant other has been provided the following suicide prevention education BY BROCHURE ONLY STATING SHE HAS ALREADY TALKED ABOUT THIS TO STAFF IN THE PAST, AND ONLY WANTS THE BROCHURE, prior to the and/or following the discharge of the patient.  The suicide prevention education provided includes the following:  Suicide risk factors  Suicide prevention and interventions  National Suicide Hotline telephone number  [facility] assessment telephone number  [facility] Emergency Assistance [place] and/or Residential Mobile Crisis Unit telephone number  Request made of family/significant other to:  Remove weapons (e.g., guns, rifles, knives), all items previously/currently identified as safety concern.    Remove drugs/medications (over-the-counter, prescriptions, illicit drugs), all items previously/currently identified as a safety concern.  The family member/significant other verbalizes understanding of the suicide prevention education information provided.  The family member/significant other agrees to remove the items of safety concern listed above.  [name] 12/30/2016, 8:54 AM

## 2016-12-30 NOTE — Progress Notes (Signed)
[facility] MD Progress Note  12/30/2016 1:46 PM [name]  MRN:  [id number] Subjective:  Patient reports she continues to feel emotional, vaguely irritable, depressed. Denies active self injurious or suicidal ideations at this time and is able to contract for safety on unit. Objective : I have reviewed chart notes and have met with patient . Patient presents vaguely anxious, depressed. Does not appear irritable at this time, but states she feels " on edge".  Attributes this to not having restarted Depakote yet- reassured that it is prescribed for her and that she will be getting it later today. Affect tends to improve during session. Visible on unit, behavior on unit in good control, no disruptive or overly agitated behaviors  Describes some intermittent passive SI, but denies plan or intention of hurting self and contracts for safety . Denies medication side effects Principal Problem:  MDD, no psychotic features   Diagnosis:   Patient Active Problem List   Diagnosis Date Noted  . MDD (major depressive disorder), recurrent severe, without psychosis ([place]) [F33.2] 12/28/2016  . Muscle strain [T14.8XXA] 09/19/2016  . Right foot injury, subsequent encounter [S99.921D] 08/25/2016  . Low back pain [M54.5] 06/27/2016  . Left wrist injury, subsequent encounter [S69.92XD] 06/27/2016  . Pain of left thumb [M79.645] 10/07/2015  . 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 Strain of left thumb [IMO0002] 02/11/2015  . Strain of right forearm [S56.911A] 02/11/2015  . Right shoulder pain [M25.511] 12/31/2014  . Injury of right little finger [S69.91XA] 12/31/2014  . Episodic cluster headache, not intractable [G44.019] 12/02/2014  . 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 Hypersomnia, recurrent [G47.13] 12/02/2014  . 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 Lower  back injury [S39.92XA] 09/30/2014  . Bipolar I disorder, most recent episode depressed ([place]) [F31.30]   . MDD (major depressive disorder), recurrent, severe, with psychosis ([place]) [F33.3] 09/23/2014  . Injury of left index finger [S69.92XA] 08/20/2014  . Right ankle sprain [S93.401A] 06/01/2014  . Contusion, multiple sites [T07.XXXA] 06/01/2014  . Strain of right gastrocnemius muscle [S86.111A] 06/01/2014  . Phonophobia [F40.298] 05/04/2014  . Photophobia of both eyes [H53.143] 05/04/2014  . Emotionally unstable borderline personality disorder ([place]) [F60.3] 05/04/2014  . Nausea with vomiting [R11.2] 05/04/2014  . Mixed bipolar I disorder ([place]) [F31.60]   . Bipolar I disorder, most recent episode mixed ([place]) [F31.60] 04/18/2014  . Bipolar affective disorder, depressed, mild ([place]) [F31.31] 04/12/2014  . Suicidal ideation [R45.851] 04/12/2014  . Injury of right shoulder and upper arm [S49.91XA] 02/17/2014  . Left leg pain [M79.605] 06/03/2013  . Right hip pain [M25.551] 06/03/2013  . Migraine with status migrainosus [G43.901] 01/08/2013  . Personality disorder ([place]) [F60.9]   . Chronic migraine [G43.709] 05/08/2012  . Contact dermatitis [L25.9] 11/27/2011  . Major depressive disorder, recurrent episode ([place]) [F33.9] 10/27/2011  . Generalized anxiety disorder [F41.1] 10/27/2011  . 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 Borderline personality disorder ([place]) [F60.3] 10/27/2011  . Right foot pain [M79.671] 09/28/2011  . 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 Ganglion cyst [M67.40] 09/29/2009  . 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 BIPOLAR DISORDER UNSPECIFIED [F31.9] 06/09/2008   Total Time spent with patient: 20 minutes   Past Medical History:  Past Medical History:  Diagnosis Date  . Achilles tendinitis   . Achilles tendinitis   .  ADHD (attention deficit  hyperactivity disorder)   . Allergy   . Arthritis   . Bipolar affective ([place])   . Bipolar disorder ([place])   . Cataracts, bilateral   . Eczema   . Ganglion cyst 09/29/2009   left wrist (2 cyst)  . Hyperprolactinemia ([place])   . Hypertension   . Lipoma   . Migraine   . Personality disorder ([place])   . Pes planus     Past Surgical History:  Procedure Laterality Date  . ANKLE SURGERY  12/88   left   . chest nodule  1990?   rt chest wall nodule removal  . GANGLION CYST EXCISION  2011  . lipoma removal    . right bunioectomy    . SHOULDER SURGERY  01/13/2011   right, partial tear  . tumor resection left thigh     Family History:  Family History  Problem Relation Age of Onset  . Hypertension Mother   . Hyperlipidemia Mother   . Heart attack Father   . Heart disease Father   . Hypertension Father   . Bipolar disorder Father   . Diabetes Paternal Grandfather   . Heart disease Maternal Aunt   . Breast cancer Maternal Aunt   . Heart disease Maternal Grandmother   . Cancer Maternal Grandmother        colon   Social History:  Social History   Substance and Sexual Activity  Alcohol Use No  . Alcohol/week: 0.0 oz     Social History   Substance and Sexual Activity  Drug Use No    Social History   Socioeconomic History  . Marital status: Single    Spouse name: None  . Number of children: 0  . Years of education: None  . Highest education level: None  Social Needs  . Financial resource strain: None  . Food insecurity - worry: None  . Food insecurity - inability: None  . Transportation needs - medical: None  . Transportation needs - non-medical: None  Occupational History    Employer: [employer]  Tobacco Use  . Smoking status: Never Smoker  . Smokeless tobacco: Never Used  Substance and Sexual Activity  . Alcohol use: No    Alcohol/week: 0.0 oz  . Drug use: No  . Sexual activity: No    Birth control/protection: Pill  Other Topics Concern  . None  Social History  Narrative   Caffeine  2 sodas daily, 1 cup coffee daily.   Additional Social History:    Pain Medications: please see mar Prescriptions: please see mar Over the Counter: please see mar History of alcohol / drug use?: No history of alcohol / drug abuse Longest period of sobriety (when/how long): NA  Sleep: Fair  Appetite:  Good  Current Medications: Current Facility-Administered Medications  Medication Dose Route Frequency Provider Last Rate Last Dose  . alum & mag hydroxide-simeth (MAALOX/MYLANTA) 200-200-20 MG/5ML suspension 30 mL  30 mL Oral Q4H PRN Rankin, Shuvon B, NP      . cetirizine (ZYRTEC) tablet 10 mg  10 mg Oral Daily Hampton Abbot, MD   10 mg at 12/30/16 0844  . diclofenac (VOLTAREN) EC tablet 75 mg  75 mg Oral BID Rankin, Shuvon B, NP   75 mg at 12/30/16 0844  . divalproex (DEPAKOTE ER) 24 hr tablet 1,500 mg  1,500 mg Oral QHS Nwoko, Agnes I, NP      . hydrOXYzine (ATARAX/VISTARIL) tablet 50 mg  50 mg Oral TID PRN Rankin, Shuvon B,  NP   50 mg at 12/30/16 1005  . lamoTRIgine (LAMICTAL) tablet 200 mg  200 mg Oral QHS Rankin, Shuvon B, NP   200 mg at 12/29/16 2159  . lurasidone (LATUDA) tablet 80 mg  80 mg Oral Q supper Rankin, Shuvon B, NP   80 mg at 12/29/16 1730  . magnesium hydroxide (MILK OF MAGNESIA) suspension 30 mL  30 mL Oral Daily PRN Rankin, Shuvon B, NP      . metoprolol succinate (TOPROL-XL) 24 hr tablet 100 mg  100 mg Oral Daily Rankin, Shuvon B, NP   100 mg at 12/29/16 0823  . ondansetron (ZOFRAN-ODT) disintegrating tablet 4 mg  4 mg Oral Q8H PRN Cobos, Fernando A, MD      . sertraline (ZOLOFT) tablet 100 mg  100 mg Oral Daily Cobos, Myer Peer, MD   100 mg at 12/30/16 0845  . traZODone (DESYREL) tablet 50 mg  50 mg Oral QHS PRN Rankin, Shuvon B, NP   50 mg at 12/29/16 2159    Lab Results:  Results for orders placed or performed during the hospital encounter of 12/28/16 (from the past 48 hour(s))  Urinalysis, Complete w Microscopic     Status: Abnormal    Collection Time: 12/29/16  6:16 AM  Result Value Ref Range   Color, Urine STRAW (A) YELLOW   APPearance CLEAR CLEAR   Specific Gravity, Urine 1.009 1.005 - 1.030   pH 7.0 5.0 - 8.0   Glucose, UA NEGATIVE NEGATIVE mg/dL   Hgb urine dipstick NEGATIVE NEGATIVE   Bilirubin Urine NEGATIVE NEGATIVE   Ketones, ur NEGATIVE NEGATIVE mg/dL   Protein, ur NEGATIVE NEGATIVE mg/dL   Nitrite NEGATIVE NEGATIVE   Leukocytes, UA NEGATIVE NEGATIVE   RBC / HPF 0-5 0 - 5 RBC/hpf   WBC, UA 0-5 0 - 5 WBC/hpf   Bacteria, UA NONE SEEN NONE SEEN   Squamous Epithelial / LPF 0-5 (A) NONE SEEN    Comment: Performed at [facility], [place] [address] [place], [place] [address]  Pregnancy, urine     Status: None   Collection Time: 12/29/16  6:16 AM  Result Value Ref Range   Preg Test, Ur NEGATIVE NEGATIVE    Comment:        THE SENSITIVITY OF THIS METHODOLOGY IS >20 mIU/mL. Performed at [facility], [place] [address] [place], [place] [address]   Urine rapid drug screen (hosp performed)not at [facility]     Status: None   Collection Time: 12/29/16  6:16 AM  Result Value Ref Range   Opiates NONE DETECTED NONE DETECTED   Cocaine NONE DETECTED NONE DETECTED   Benzodiazepines NONE DETECTED NONE DETECTED   Amphetamines NONE DETECTED NONE DETECTED   Tetrahydrocannabinol NONE DETECTED NONE DETECTED   Barbiturates NONE DETECTED NONE DETECTED    Comment:        DRUG SCREEN FOR MEDICAL PURPOSES ONLY.  IF CONFIRMATION IS NEEDED FOR ANY PURPOSE, NOTIFY LAB WITHIN 5 DAYS.        LOWEST DETECTABLE LIMITS FOR URINE DRUG SCREEN Drug Class       Cutoff (ng/mL) Amphetamine      1000 Barbiturate      200 Benzodiazepine   762 Tricyclics       263 Opiates          300 Cocaine          300 THC              50 Performed at  [facility], [place] [address] [place], [place] [address]   CBC     Status: Abnormal   Collection Time: 12/29/16  6:27 AM  Result Value Ref Range    WBC 7.4 4.0 - 10.5 K/uL   RBC 4.75 3.87 - 5.11 MIL/uL   Hemoglobin 15.2 (H) 12.0 - 15.0 g/dL   HCT 44.6 36.0 - 46.0 %   MCV 93.9 78.0 - 100.0 fL   MCH 32.0 26.0 - 34.0 pg   MCHC 34.1 30.0 - 36.0 g/dL   RDW 12.8 11.5 - 15.5 %   Platelets 241 150 - 400 K/uL    Comment: Performed at [facility], [place] [address], [place], [place] [address]  Comprehensive metabolic panel     Status: Abnormal   Collection Time: 12/29/16  6:27 AM  Result Value Ref Range   Sodium 140 135 - 145 mmol/L   Potassium 3.9 3.5 - 5.1 mmol/L   Chloride 104 101 - 111 mmol/L   CO2 28 22 - 32 mmol/L   Glucose, Bld 88 65 - 99 mg/dL   BUN 16 6 - 20 mg/dL   Creatinine, Ser 1.09 (H) 0.44 - 1.00 mg/dL   Calcium 9.5 8.9 - 10.3 mg/dL   Total Protein 6.3 (L) 6.5 - 8.1 g/dL   Albumin 3.8 3.5 - 5.0 g/dL   AST 27 15 - 41 U/L   ALT 14 14 - 54 U/L   Alkaline Phosphatase 60 38 - 126 U/L   Total Bilirubin 0.6 0.3 - 1.2 mg/dL   GFR calc non Af Amer 59 (L) >60 mL/min   GFR calc Af Amer >60 >60 mL/min    Comment: (NOTE) The eGFR has been calculated using the CKD EPI equation. This calculation has not been validated in all clinical situations. eGFR's persistently <60 mL/min signify possible Chronic Kidney Disease.    Anion gap 8 5 - 15    Comment: Performed at [facility], [place] [address] [place], [place] [address]  Hemoglobin A1c     Status: None   Collection Time: 12/29/16  6:27 AM  Result Value Ref Range   Hgb A1c MFr Bld 5.1 4.8 - 5.6 %    Comment: (NOTE) Pre diabetes:          5.7%-6.4% Diabetes:              >6.4% Glycemic control for   <7.0% adults with diabetes    Mean Plasma Glucose 99.67 mg/dL    Comment: Performed at [place] [address], [place], [place] [address]  Ethanol     Status: None   Collection Time: 12/29/16  6:27 AM  Result Value Ref Range   Alcohol, Ethyl (B) <10 <10 mg/dL    Comment:        LOWEST DETECTABLE LIMIT FOR SERUM ALCOHOL IS 10  mg/dL FOR MEDICAL PURPOSES ONLY Performed at [facility], [place] [address], [place], [place] [address]   Lipid panel     Status: Abnormal   Collection Time: 12/29/16  6:27 AM  Result Value Ref Range   Cholesterol 198 0 - 200 mg/dL   Triglycerides 136 <150 mg/dL   HDL 53 >40 mg/dL   Total CHOL/HDL Ratio 3.7 RATIO   VLDL 27 0 - 40 mg/dL   LDL Cholesterol 118 (H) 0 - 99 mg/dL    Comment:        Total Cholesterol/HDL:CHD Risk Coronary Heart Disease Risk Table  Men   Women  1/2 Average Risk   3.4   3.3  Average Risk       5.0   4.4  2 X Average Risk   9.6   7.1  3 X Average Risk  23.4   11.0        Use the calculated Patient Ratio above and the CHD Risk Table to determine the patient's CHD Risk.        ATP III CLASSIFICATION (LDL):  <100     mg/dL   Optimal  100-129  mg/dL   Near or Above                    Optimal  130-159  mg/dL   Borderline  160-189  mg/dL   High  >190     mg/dL   Very High Performed at [place] [address], [place], [place] [address]   TSH     Status: None   Collection Time: 12/29/16  6:27 AM  Result Value Ref Range   TSH 4.288 0.350 - 4.500 uIU/mL    Comment: Performed by a 3rd Generation assay with a functional sensitivity of <=0.01 uIU/mL. Performed at [facility], [place] [address], [place], [place] [address]   Valproic acid level     Status: None   Collection Time: 12/29/16  6:27 AM  Result Value Ref Range   Valproic Acid Lvl 73 50.0 - 100.0 ug/mL    Comment: Performed at [facility], [place] [address] [place],  [address]    Blood Alcohol level:  Lab Results  Component Value Date   [facility] <10 12/29/2016   ETH <5 35/32/9924    Metabolic Disorder Labs: Lab Results  Component Value Date   HGBA1C 5.1 12/29/2016   MPG 99.67 12/29/2016   MPG 100 07/19/2016   Lab Results  Component Value Date   PROLACTIN 52.8 (H) 07/19/2016   PROLACTIN  08/21/2009     14.3 (NOTE)     Reference Ranges:                 Female:                       2.1 -  17.1 ng/ml                 Female:   Pregnant          9.7 - 208.5 ng/mL                           Non Pregnant      2.8 -  29.2 ng/mL                           Post  Menopausal   1.8 -  20.3 ng/mL                     Lab Results  Component Value Date   CHOL 198 12/29/2016   TRIG 136 12/29/2016   HDL 53 12/29/2016   CHOLHDL 3.7 12/29/2016   VLDL 27 12/29/2016   LDLCALC 118 (H) 12/29/2016   LDLCALC 100 (H) 07/19/2016    Physical Findings: AIMS: Facial and Oral Movements Muscles of Facial Expression: None, normal Lips and Perioral Area: None, normal Jaw: None, normal Tongue: None, normal,Extremity Movements Upper (arms, wrists, hands, fingers):  None, normal Lower (legs, knees, ankles, toes): None, normal, Trunk Movements Neck, shoulders, hips: None, normal, Overall Severity Severity of abnormal movements (highest score from questions above): None, normal Incapacitation due to abnormal movements: None, normal Patient's awareness of abnormal movements (rate only patient's report): No Awareness, Dental Status Current problems with teeth and/or dentures?: No Does patient usually wear dentures?: No  CIWA:    COWS:     Musculoskeletal: Strength & Muscle Tone: within normal limits Gait & Station: normal Patient leans: N/A  Psychiatric Specialty Exam: Physical Exam  ROS no headache, no chest pain, no shortness of breath, no vomiting   Blood pressure 119/78, pulse 78, temperature 97.8 F (36.6 C), temperature source Oral, resp. rate 16, height 5' 10"  (1.778 m), weight 124.7 kg (275 lb), last menstrual period 12/23/2016, SpO2 100 %.Body mass index is 39.46 kg/m.  General Appearance: Fairly Groomed  Eye Contact:  Good  Speech:  Normal Rate  Volume:  Normal  Mood:  remains depressed, vaguely anxious   Affect:  anxious, constricted, and reports she feels irritable, but does not present angry or  irritable at this time  Thought Process:  Linear and Descriptions of Associations: Intact  Orientation:  Full (Time, Place, and Person)  Thought Content:  no hallucinations, no delusions expressed   Suicidal Thoughts:  Yes.  without intent/plan denies any active suicidal ideations or any self injurious ideations, contracts for safety .   Homicidal Thoughts:  No denies violent or homicidal ideations  Memory:  recent and remote grossly intact   Judgement:  Fair- improving   Insight:  improving   Psychomotor Activity:  Normal  Concentration:  Concentration: Good and Attention Span: Good  Recall:  Good  Fund of Knowledge:  Good  Language:  Good  Akathisia:  Negative  Handed:  Right  AIMS (if indicated):     Assets:  Communication Skills Desire for Improvement Resilience  ADL's:  Intact  Cognition:  WNL  Sleep:  Number of Hours: 6   Assessment - patient is reporting ongoing depression, subjective irritability and anxiety, but affect presents more reactive. Endorses some passive SI, but denies plan or intention of hurting self and contracts for safety, and presents future oriented . Tolerating medications well .  Reports prior diagnosis of Bipolar Disorder and states she has responded to mood stabilizers . Does not endorse history of distinct manic episode, but reports mood instability, periods of increased irritability.  Treatment Plan Summary: Daily contact with patient to assess and evaluate symptoms and progress in treatment, Medication management, Plan inpatient treatment  and medications as below  Encourage group and milieu participation to work on coping skills and symptom reduction Treatment team working on disposition planning options  Continue Depakote ER 1500 mgrs QHS for mood disorder Continue Lamictal 200 mgrs QDAY for mood disorder Continue Latuda 80 mgrs QDAY for mood disorder Continue Trazodone 50 mgrs QHS PRN for insomnia as needed  Jenne Campus, MD 12/30/2016, 1:46  PM

## 2016-12-30 NOTE — BHH Group Notes (Signed)
[facility] LCSW Group Therapy Note  Date/Time:    12/30/2016 10:00-11:00AM  Type of Therapy and Topic:  Group Therapy:  Healthy Coping Skills  Participation Level:  Active   Description of Group:  The focus of this group was to determine what negative statements people say to themselves, and why that is an unhealthy coping skill, the end results of using that technique, and the reasons negative self-talk is so prevalent.  Commonalities between patients were pointed out.  Time was then spent teaching cognitive reframing and grounding techniques.  Therapeutic Goals 1. Patients learned that their unhealthy and negative self-talk is a common way for people with mood disorders to cope. 2. Patients identified their own particular negative self-talk tendencies. 3. Patients learned and practiced how to reframe negative statements and how to give themselves permission to not be perfect. 4. Patients provided support and ideas to each other 5. Patients learned and practiced 3 grounding techniques to return to the present moment while ruminating on past wrongs.  Summary of Patient Progress: During group, patient expressed that the negative self-talk she often uses is "I'm never going to get it right, I deserve whatever is wrong in my life."  She stated she grieves the life she does not have.  She talked extensively about the negative messages her mother also gives to her, which appears to be a significant deterrent to her long-term mental health recovery.   Therapeutic Modalities Cognitive Behavioral Therapy Motivational Interviewing

## 2016-12-31 NOTE — Progress Notes (Signed)
Psychoeducational Group Note  Date:  12/31/2016 Time:  2138  Group Topic/Focus:  Wrap-Up Group:   The focus of this group is to help patients review their daily goal of treatment and discuss progress on daily workbooks.  Participation Level: Did Not Attend  Participation Quality:  Not Applicable  Affect:  Not Applicable  Cognitive:  Not Applicable  Insight:  Not Applicable  Engagement in Group: Not Applicable  Additional Comments:  The patient did not attend group this evening since she complained of feeling very drowsy and napped.   Archie Balboa S 12/31/2016, 9:38 PM

## 2016-12-31 NOTE — Progress Notes (Signed)
D. Pt pleasant but anxious on approach.  Pt states she has had an upsetting day with much anxiety.  Pt states the activity and noise level from peers on the unit contributes to this.  Pt continues to endorse passive SI but contracts for safety on the unit.  Pt denies HI/AVH at this time.  A.  Support and encouragement offered, medication given as ordered.  R.  Pt remains safe on the unit, will continue to monitor.

## 2016-12-31 NOTE — Progress Notes (Signed)
D: Patient observed up in dayroom, watchful. Patient states she became angry and agitated this morning in the breakfast line. "Somone brushed against me. I almost said something but I decided not to." Patient social with staff as many are known to her from previous admits. Patient's affect incongruent with report of symptoms. Smiling while discussing events prior to admit. Mood slightly anxious but improving. Reports passive SI but denies plan, intent. Verbal contract in place for safety. Patient refuses to complete self inventory stating, "you know I hate those things."  States goal for today is to "hold it together." Complained of headache of a 7/10, chronic foot pain of a 4/10.   A: Medicated per orders, prn advil and vistaril given for complaints. Level III obs in place for safety. Emotional support offered and self inventory reviewed. Encouraged completion of Suicide Safety Plan and programming participation. Discussed POC with MD.  Fall prevention plan in place and reviewed with patient as pt is a high fall risk due to hx of falls PTA.   R: Patient verbalizes understanding of POC, falls prevention education. On reassess, patient reports some relief from vistaril, headache rated at a 5/10. Patient denies HI/AVH and remains safe on level III obs. Will continue to monitor closely and make verbal contact frequently.

## 2016-12-31 NOTE — Plan of Care (Signed)
Patient reports periods of agitation, irritability particularly with peers, in cafeteria and dayroom. Able to come to staff for support, verbalizes need for prn medications appropriately. No periods of anger, inappropriate outbursts.

## 2016-12-31 NOTE — Progress Notes (Signed)
D.  Pt in bed on approach, denies complaints at this time.  Pt did not get up to attend evening group, feeling quite tired.  Pt has remained in bed throughout shift.  Pt continues to endorse passive SI, denies HI/AVH at this time.  Pt does contract for safety on the unit.  A.  Support and encouragement offered, medication given as ordered.  R. Pt remains safe on the unit, will continue to monitor.

## 2016-12-31 NOTE — Progress Notes (Signed)
  Date:  12/31/2016 Time: 1315  Group Topic/Focus:  Recovery Goals:   The focus of this group is to identify appropriate goals for recovery and establish a plan to achieve them.  Participation Level:  Active  Participation Quality:  Attentive  Affect:  Appropriate  Cognitive:  Appropriate  Insight: Appropriate  Engagement in Group:  Engaged  Modes of Intervention:  Discussion and Education  Additional Comments:   [employer] Null L 12/31/2016, 5:26 PM  

## 2016-12-31 NOTE — Progress Notes (Signed)
[facility] MD Progress Note  12/31/2016 5:41 PM [name]  MRN:  [id number] Subjective:  Patient reports she still feels anxious, vaguely irritable , particularly in group settings or situations such as being in line or similar where people are near her. States " it's not like I am going to do anything about it", but expresses concern because she works in Scientist, research (medical) and her job requires Community education officer , public . States she had fleeting passive suicidal ideations earlier today, but denies plan or intention of hurting self or of suicide. She is future oriented, and for example today spoke about paying her brother some money for him to fix some cabinets at her home. Denies medication side effects, but expresses concern " they do not seem to be working as well as I would want ". Of note, she has been on current medications for months to years, and denies side effects.  Objective : I have reviewed chart notes and have met with patient . Patient reports ongoing depression, anxiety, and vague irritability, as described above. Her affect presents improved compared to admission, more reactive . Denies suicidal plan or intention and contracts for safety on unit . Behavior on unit calm and in good control, visible in day room.  Thus far tolerating Zoloft titration well . States she is happy that she is " back on Depakote ER", which she missed x 2 days on admission- reports it was prescribed for headache prophylaxis , but she has noticed it helps her mood as well .   Principal Problem:  MDD, no psychotic features   Diagnosis:   Patient Active Problem List   Diagnosis Date Noted  . MDD (major depressive disorder), recurrent severe, without psychosis ([place]) [F33.2] 12/28/2016  . Muscle strain [T14.8XXA] 09/19/2016  . Right foot injury, subsequent encounter [S99.921D] 08/25/2016  . Low back pain [M54.5] 06/27/2016  . Left wrist injury, subsequent encounter [S69.92XD] 06/27/2016  . Pain of left thumb  [M79.645] 10/07/2015  . 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 Strain of left thumb [IMO0002] 02/11/2015  . Strain of right forearm [S56.911A] 02/11/2015  . Right shoulder pain [M25.511] 12/31/2014  . Injury of right little finger [S69.91XA] 12/31/2014  . Episodic cluster headache, not intractable [G44.019] 12/02/2014  . 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 Hypersomnia, recurrent [G47.13] 12/02/2014  . 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 Lower back injury [S39.92XA] 09/30/2014  . Bipolar I disorder, most recent episode depressed ([place]) [F31.30]   . MDD (major depressive disorder), recurrent, severe, with psychosis ([place]) [F33.3] 09/23/2014  . Injury of left index finger [S69.92XA] 08/20/2014  . Right ankle sprain [S93.401A] 06/01/2014  . Contusion, multiple sites [T07.XXXA] 06/01/2014  . Strain of right gastrocnemius muscle [S86.111A] 06/01/2014  . Phonophobia [F40.298] 05/04/2014  . Photophobia of both eyes [H53.143] 05/04/2014  . Emotionally unstable borderline personality disorder ([place]) [F60.3] 05/04/2014  . Nausea with vomiting [R11.2] 05/04/2014  . Mixed bipolar I disorder ([place]) [F31.60]   . Bipolar I disorder, most recent episode mixed ([place]) [F31.60] 04/18/2014  . Bipolar affective disorder, depressed, mild ([place]) [F31.31] 04/12/2014  . Suicidal ideation [R45.851] 04/12/2014  . Injury of right shoulder and upper arm [S49.91XA] 02/17/2014  . Left leg pain [M79.605] 06/03/2013  . Right hip pain [M25.551] 06/03/2013  . Migraine with status migrainosus [G43.901] 01/08/2013  . Personality disorder ([place]) [F60.9]   . Chronic migraine [G43.709] 05/08/2012  .  Contact dermatitis [L25.9] 11/27/2011  . Major depressive disorder, recurrent episode ([place]) [F33.9] 10/27/2011  . Generalized anxiety disorder [F41.1] 10/27/2011  . 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 Borderline personality disorder ([place]) [F60.3] 10/27/2011  . Right foot pain [M79.671] 09/28/2011  . 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 Ganglion cyst [M67.40] 09/29/2009  . 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 BIPOLAR DISORDER UNSPECIFIED [F31.9] 06/09/2008   Total Time spent with patient: 20 minutes   Past Medical History:  Past Medical History:  Diagnosis Date  . Achilles tendinitis   . Achilles tendinitis   . ADHD (attention deficit hyperactivity disorder)   . Allergy   . Arthritis   . Bipolar affective ([place])   . Bipolar disorder ([place])   . Cataracts, bilateral   . Eczema   . Ganglion cyst 09/29/2009   left wrist (2 cyst)  . Hyperprolactinemia ([place])   . Hypertension   . Lipoma   . Migraine   . Personality disorder ([place])   . Pes planus     Past Surgical History:  Procedure Laterality Date  . ANKLE SURGERY  12/88   left   . chest nodule  1990?   rt chest wall nodule removal  . GANGLION CYST EXCISION  2011  . lipoma removal    . right bunioectomy    . SHOULDER SURGERY  01/13/2011   right, partial tear  . tumor resection left thigh     Family History:  Family History  Problem Relation Age of Onset  . Hypertension Mother   . Hyperlipidemia Mother   . Heart attack Father   . Heart disease Father   . Hypertension Father   . Bipolar disorder Father   . Diabetes Paternal Grandfather   . Heart disease Maternal Aunt   . Breast cancer Maternal Aunt   . Heart disease Maternal Grandmother   . Cancer Maternal Grandmother        colon   Social History:  Social History   Substance and Sexual Activity  Alcohol Use No  . Alcohol/week: 0.0 oz     Social History   Substance and Sexual Activity  Drug Use No    Social History   Socioeconomic History  . Marital status: Single    Spouse name: None  . Number of children:  0  . Years of education: None  . Highest education level: None  Social Needs  . Financial resource strain: None  . Food insecurity - worry: None  . Food insecurity - inability: None  . Transportation needs - medical: None  . Transportation needs - non-medical: None  Occupational History    Employer: [employer]  Tobacco Use  . Smoking status: Never Smoker  . Smokeless tobacco: Never Used  Substance and Sexual Activity  . Alcohol use: No    Alcohol/week: 0.0 oz  . Drug use: No  . Sexual activity: No    Birth control/protection: Pill  Other Topics Concern  . None  Social History Narrative   Caffeine  2 sodas daily, 1 cup coffee daily.   Additional Social History:    Pain Medications: please see mar Prescriptions: please see mar Over the Counter: please see mar History of alcohol / drug use?: No history of alcohol / drug abuse Longest period of sobriety (when/how long): NA  Sleep: Fair- improving   Appetite:  Good  Current Medications: Current Facility-Administered Medications  Medication  Dose Route Frequency Provider Last Rate Last Dose  . alum & mag hydroxide-simeth (MAALOX/MYLANTA) 200-200-20 MG/5ML suspension 30 mL  30 mL Oral Q4H PRN Rankin, Shuvon B, NP      . cetirizine (ZYRTEC) tablet 10 mg  10 mg Oral Daily Hampton Abbot, MD   10 mg at 12/31/16 0816  . divalproex (DEPAKOTE ER) 24 hr tablet 1,500 mg  1,500 mg Oral QHS Nwoko, Agnes I, NP   1,500 mg at 12/30/16 2108  . hydrOXYzine (ATARAX/VISTARIL) tablet 50 mg  50 mg Oral TID PRN Rankin, Shuvon B, NP   50 mg at 12/31/16 1713  . ibuprofen (ADVIL,MOTRIN) tablet 600 mg  600 mg Oral Q6H PRN Homero Hyson, Myer Peer, MD   600 mg at 12/31/16 0818  . lamoTRIgine (LAMICTAL) tablet 200 mg  200 mg Oral QHS Rankin, Shuvon B, NP   200 mg at 12/30/16 2108  . lurasidone (LATUDA) tablet 80 mg  80 mg Oral Q supper Rankin, Shuvon B, NP   80 mg at 12/31/16 1702  . magnesium hydroxide (MILK OF MAGNESIA) suspension 30 mL  30 mL Oral Daily PRN  Rankin, Shuvon B, NP      . metoprolol succinate (TOPROL-XL) 24 hr tablet 100 mg  100 mg Oral Daily Rankin, Shuvon B, NP   100 mg at 12/31/16 0813  . ondansetron (ZOFRAN-ODT) disintegrating tablet 4 mg  4 mg Oral Q8H PRN Charmel Pronovost A, MD      . sertraline (ZOLOFT) tablet 100 mg  100 mg Oral Daily Katelynn Heidler, Myer Peer, MD   100 mg at 12/31/16 0816  . traZODone (DESYREL) tablet 50 mg  50 mg Oral QHS PRN Rankin, Shuvon B, NP   50 mg at 12/30/16 2109    Lab Results:  No results found for this or any previous visit (from the past 48 hour(s)).  Blood Alcohol level:  Lab Results  Component Value Date   ETH <10 12/29/2016   ETH <5 99/83/3825    Metabolic Disorder Labs: Lab Results  Component Value Date   HGBA1C 5.1 12/29/2016   MPG 99.67 12/29/2016   MPG 100 07/19/2016   Lab Results  Component Value Date   PROLACTIN 52.8 (H) 07/19/2016   PROLACTIN  08/21/2009    14.3 (NOTE)     Reference Ranges:                 Female:                       2.1 -  17.1 ng/ml                 Female:   Pregnant          9.7 - 208.5 ng/mL                           Non Pregnant      2.8 -  29.2 ng/mL                           Post  Menopausal   1.8 -  20.3 ng/mL                     Lab Results  Component Value Date   CHOL 198 12/29/2016   TRIG 136 12/29/2016   HDL 53 12/29/2016   CHOLHDL 3.7 12/29/2016   VLDL 27 12/29/2016   LDLCALC  118 (H) 12/29/2016   LDLCALC 100 (H) 07/19/2016    Physical Findings: AIMS: Facial and Oral Movements Muscles of Facial Expression: None, normal Lips and Perioral Area: None, normal Jaw: None, normal Tongue: None, normal,Extremity Movements Upper (arms, wrists, hands, fingers): None, normal Lower (legs, knees, ankles, toes): None, normal, Trunk Movements Neck, shoulders, hips: None, normal, Overall Severity Severity of abnormal movements (highest score from questions above): None, normal Incapacitation due to abnormal movements: None, normal Patient's awareness  of abnormal movements (rate only patient's report): No Awareness, Dental Status Current problems with teeth and/or dentures?: No Does patient usually wear dentures?: No  CIWA:    COWS:     Musculoskeletal: Strength & Muscle Tone: within normal limits Gait & Station: normal Patient leans: N/A  Psychiatric Specialty Exam: Physical Exam  ROS no headache, no chest pain, no shortness of breath, no vomiting   Blood pressure 120/70, pulse 78, temperature 98.2 F (36.8 C), temperature source Oral, resp. rate 16, height _0  (1.778 m), weight 124.7 kg (275 lb), last menstrual period 12/23/2016, SpO2 100 %.Body mass index is 39.46 kg/m.  General Appearance: Well Groomed  Eye Contact:  Good  Speech:  Normal Rate  Volume:  Normal  Mood:  reports ongoing depression, but affect presents improved   Affect:  feels subjectively irritable at times, but affect is reactive, smiles at times appropriately, does not present irritable at this time  Thought Process:  Linear and Descriptions of Associations: Intact  Orientation:  Full (Time, Place, and Person)  Thought Content:  no hallucinations, no delusions expressed   Suicidal Thoughts:  Yes.  without intent/plan denies any active suicidal ideations or any self injurious ideations, contracts for safety .   Homicidal Thoughts:  No denies violent or homicidal ideations  Memory:  recent and remote grossly intact   Judgement:  improving   Insight:  improving   Psychomotor Activity:  Normal  Concentration:  Concentration: Good and Attention Span: Good  Recall:  Good  Fund of Knowledge:  Good  Language:  Good  Akathisia:  Negative  Handed:  Right  AIMS (if indicated):     Assets:  Communication Skills Desire for Improvement Resilience  ADL's:  Intact  Cognition:  WNL  Sleep:  Number of Hours: 6   Assessment - patient reports ongoing depression and subjective irritability, as well as intermittent passive SI, but presents with improving mood and  range of affect . Denies SI at this time. Tolerating medications well, which she has been on for months to years, and has tolerated well . Zoloft now titrated to 100 mgrs DAY , denies side effects thus far . '  Treatment Plan Summary: Treatment Plan reviewed as below today 12/2  Daily contact with patient to assess and evaluate symptoms and progress in treatment, Medication management, Plan inpatient treatment  and medications as below  Encourage group and milieu participation to work on coping skills and symptom reduction Treatment team working on disposition planning options  Continue Depakote ER 1500 mgrs QHS for mood disorder Continue Lamictal 200 mgrs QDAY for mood disorder Continue Zoloft 100 mgrs QDAY for mood disorder, depression, anxiety  Continue Latuda 80 mgrs QDAY for mood disorder Continue Trazodone 50 mgrs QHS PRN for insomnia as needed  Jenne Campus, MD 12/31/2016, 5:41 PM   Patient ID: [name] Pilat, female   DOB: [birth year], 46 y.o.   MRN: [id number]

## 2016-12-31 NOTE — [facility] Group Notes (Signed)
[facility] LCSW Group Therapy Note  Date/Time:  12/31/2016 10:00-11:00AM  Type of Therapy and Topic:  Group Therapy:  Healthy and Unhealthy Supports  Participation Level:  Active   Description of Group:  Patients in this group were introduced to the idea of adding a variety of healthy supports to address the various needs in their lives. The picture on the front of Sunday's workbook was used to demonstrate why more supports are needed in every patient's life.  Patients identified and described healthy supports versus unhealthy supports in general, then gave examples of each in their own lives.   They discussed what additional healthy supports could be helpful in their recovery and wellness after discharge in order to prevent future hospitalizations.   An emphasis was placed on using counselor, doctor, therapy groups, 12-step groups, and problem-specific support groups to expand supports.  They also worked as a group on developing a specific plan for several patients to deal with unhealthy supports through [place], psychoeducation with loved ones, and even termination of relationships.   Therapeutic Goals:   1)  discuss importance of adding supports to stay well once out of the hospital  2)  compare healthy versus unhealthy supports and identify some examples of each  3)  generate ideas and descriptions of healthy supports that can be added  4)  offer mutual support about how to address unhealthy supports  5)  encourage active participation in and adherence to discharge plan    Summary of Patient Progress:  The patient shared that the current healthy supports available in her life are nonexistent, while the current unhealthy supports are her mother and friends.  The patient was unable to express any hope or willingness to add anything to help in her recovery journey.   Therapeutic Modalities:   Motivational Interviewing Brief Solution-Focused Therapy  Selmer Dominion,  LCSW 12/31/2016, 11:00AM

## 2017-01-01 DIAGNOSIS — G4700 Insomnia, unspecified: Secondary | ICD-10-CM

## 2017-01-01 DIAGNOSIS — F39 Unspecified mood [affective] disorder: Secondary | ICD-10-CM

## 2017-01-01 MED ORDER — SUMATRIPTAN SUCCINATE 50 MG PO TABS
50.0000 mg | ORAL_TABLET | Freq: Once | ORAL | Status: AC
  Administered 2017-01-01: 50 mg via ORAL
  Filled 2017-01-01 (×2): qty 1

## 2017-01-01 MED ORDER — LORAZEPAM 0.5 MG PO TABS: 0.5000 mg | ORAL_TABLET | Freq: Four times a day (QID) | ORAL | Status: DC | PRN

## 2017-01-01 MED ORDER — SERTRALINE HCL 50 MG PO TABS: 50.0000 mg | ORAL_TABLET | Freq: Every day | ORAL | Status: DC

## 2017-01-01 NOTE — Progress Notes (Addendum)
Ibuprofen taken from pyxis and then patient refused.  Patient stated to MD that she wanted imitrix for headaches.

## 2017-01-01 NOTE — Plan of Care (Signed)
Nurse discussed depression/anxiety/coping skills with patient.

## 2017-01-01 NOTE — Progress Notes (Signed)
D:  Patient's self inventory sheet, patient stated she sleeps good, sleep medication helpful.  Fair appetite, low energy level, good concentration.  Rated depression 6, hopeless 5, anxiety 7.  Denied withdrawals.  SI, sometimes, no plan, contracts for safety.  Physical problems, pain, headaches, worst pain #4 in past 24 hours, head, feet.  Pain medication helpful.  Goal is learning more coping skills and practicing what she has learned.  Plans to review work book and try breathing exercises when needed.  BP seemed low, taking BP medications, will discuss with MD. A:  Medications administered per MD orders.  Emotional support and encouragement given patient. R:  Patient denied HI.  Denied A/V hallucinations.  Patient stated she does have SI thoughts off/on, contracts for safety, no plan. Patient was moved to another room as her roommate was causing her stress, anxiety.

## 2017-01-01 NOTE — Progress Notes (Signed)
Recreation Therapy Notes  Date: 01/01/17 Time: 0930 Location: 300 Hall Dayroom  Group Topic: Stress Management  Goal Area(s) Addresses:  Patient will verbalize importance of using healthy stress management.  Patient will identify positive emotions associated with healthy stress management.   Intervention: Stress Management  Activity :  Meditation.  LRT introduced the stress management technique of meditation.  LRT played a body scan meditation from the Calm app that allowed patients to take inventory of the tension or sensations they were feeling.  Education:  Stress Management, Discharge Planning.   Education Outcome: Acknowledges edcuation/In group clarification offered/Needs additional education  Clinical Observations/Feedback: Pt did not attend group.   Victorino Sparrow, LRT/CTRS         Victorino Sparrow A 01/01/2017 12:46 PM

## 2017-01-01 NOTE — Progress Notes (Signed)
[facility] MD Progress Note  01/01/2017 1:26 PM Zuleica Seith Lyvers  MRN:  [id number] Subjective:  Patient reports she is feeling more irritable today. States  " it's like people speaking loudly and wanting to talk to me irritate me". 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 Endorses intermittent passive SI, and states she sometimes wishes she would die, but denies any suicidal plan or intention and contracts for safety . Denies medication side effects.    Objective : I have discussed case with treatment team and have met with patient .  Patient reports increased subjective sense of irritability at this time. She does not present with overt manic or hypomanic symptoms- no pressured or loud speech, no flight of ideations, no grandiosity, slept "OK" last night. Tolerating medications well, denies side effects. Has responded well to Depakote ER, Lamictal, Latuda. She is also on Zoloft, and may be experiencing increased irritability after Zoloft was titrated from 50 to 100 mgrs daily. On unit vaguely irritable on approach, but behavior in good control. Affect improves during session. Reports feeling depressed and anxious due to persistence of symptoms and how these may affect her quality of work at a [employer]ational [employer] . Responds to reassurance and support .    Principal Problem:  MDD, no psychotic features   Diagnosis:   Patient Active Problem List   Diagnosis Date Noted  . MDD (major depressive disorder), recurrent severe, without psychosis ([place]) [F33.2] 12/28/2016  . Muscle strain [T14.8XXA] 09/19/2016  . Right foot injury, subsequent encounter [S99.921D] 08/25/2016  . Low back pain [M54.5] 06/27/2016  . Left wrist injury, subsequent encounter [S69.92XD] 06/27/2016  . Pain of left thumb [M79.645] 10/07/2015  . 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 Strain of left thumb [IMO0002] 02/11/2015   . Strain of right forearm [S56.911A] 02/11/2015  . Right shoulder pain [M25.511] 12/31/2014  . Injury of right little finger [S69.91XA] 12/31/2014  . Episodic cluster headache, not intractable [G44.019] 12/02/2014  . 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 Hypersomnia, recurrent [G47.13] 12/02/2014  . 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 Lower back injury [S39.92XA] 09/30/2014  . Bipolar I disorder, most recent episode depressed ([place]) [F31.30]   . MDD (major depressive disorder), recurrent, severe, with psychosis ([place]) [F33.3] 09/23/2014  . Injury of left index finger [S69.92XA] 08/20/2014  . Right ankle sprain [S93.401A] 06/01/2014  . Contusion, multiple sites [T07.XXXA] 06/01/2014  . Strain of right gastrocnemius muscle [S86.111A] 06/01/2014  . Phonophobia [F40.298] 05/04/2014  . Photophobia of both eyes [H53.143] 05/04/2014  . Emotionally unstable borderline personality disorder ([place]) [F60.3] 05/04/2014  . Nausea with vomiting [R11.2] 05/04/2014  . Mixed bipolar I disorder ([place]) [F31.60]   . Bipolar I disorder, most recent episode mixed ([place]) [F31.60] 04/18/2014  . Bipolar affective disorder, depressed, mild ([place]) [F31.31] 04/12/2014  . Suicidal ideation [R45.851] 04/12/2014  . Injury of right shoulder and upper arm [S49.91XA] 02/17/2014  . Left leg pain [M79.605] 06/03/2013  . Right hip pain [M25.551] 06/03/2013  . Migraine with status migrainosus [G43.901] 01/08/2013  . Personality disorder ([place]) [F60.9]   . Chronic migraine [G43.709] 05/08/2012  . Contact dermatitis [L25.9] 11/27/2011  . Major depressive disorder, recurrent episode ([place]) [F33.9] 10/27/2011  . Generalized anxiety disorder [F41.1] 10/27/2011  . ADHD (attention deficit hyperactivity disorder), inattentive type [  F90.0] 10/27/2011  . Borderline personality disorder ([place]) [F60.3]  10/27/2011  . Right foot pain [M79.671] 09/28/2011  . 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 Ganglion cyst [M67.40] 09/29/2009  . 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 BIPOLAR DISORDER UNSPECIFIED [F31.9] 06/09/2008   Total Time spent with patient: 20 minutes   Past Medical History:  Past Medical History:  Diagnosis Date  . Achilles tendinitis   . Achilles tendinitis   . ADHD (attention deficit hyperactivity disorder)   . Allergy   . Arthritis   . Bipolar affective ([place])   . Bipolar disorder ([place])   . Cataracts, bilateral   . Eczema   . Ganglion cyst 09/29/2009   left wrist (2 cyst)  . Hyperprolactinemia ([place])   . Hypertension   . Lipoma   . Migraine   . Personality disorder ([place])   . Pes planus     Past Surgical History:  Procedure Laterality Date  . ANKLE SURGERY  12/88   left   . chest nodule  1990?   rt chest wall nodule removal  . GANGLION CYST EXCISION  2011  . lipoma removal    . right bunioectomy    . SHOULDER SURGERY  01/13/2011   right, partial tear  . tumor resection left thigh     Family History:  Family History  Problem Relation Age of Onset  . Hypertension Mother   . Hyperlipidemia Mother   . Heart attack Father   . Heart disease Father   . Hypertension Father   . Bipolar disorder Father   . Diabetes Paternal Grandfather   . Heart disease Maternal Aunt   . Breast cancer Maternal Aunt   . Heart disease Maternal Grandmother   . Cancer Maternal Grandmother        colon   Social History:  Social History   Substance and Sexual Activity  Alcohol Use No  . Alcohol/week: 0.0 oz     Social History   Substance and Sexual Activity  Drug Use No    Social History   Socioeconomic History  . Marital status: Single    Spouse name: None  . Number of children: 0  . Years of education: None  . Highest education level: None  Social Needs  . Financial resource strain: None  .  Food insecurity - worry: None  . Food insecurity - inability: None  . Transportation needs - medical: None  . Transportation needs - non-medical: None  Occupational History    Employer: [employer]  Tobacco Use  . Smoking status: Never Smoker  . Smokeless tobacco: Never Used  Substance and Sexual Activity  . Alcohol use: No    Alcohol/week: 0.0 oz  . Drug use: No  . Sexual activity: No    Birth control/protection: Pill  Other Topics Concern  . None  Social History Narrative   Caffeine  2 sodas daily, 1 cup coffee daily.   Additional Social History:    Pain Medications: please see mar Prescriptions: please see mar Over the Counter: please see mar History of alcohol / drug use?: No history of alcohol / drug abuse Longest period of sobriety (when/how long): NA  Sleep: improving   Appetite:  Good  Current Medications: Current Facility-Administered Medications  Medication Dose Route Frequency Provider Last Rate Last Dose  . alum & mag hydroxide-simeth (MAALOX/MYLANTA) 200-200-20 MG/5ML suspension 30 mL  30 mL Oral Q4H PRN Rankin, Shuvon B, NP      .  cetirizine (ZYRTEC) tablet 10 mg  10 mg Oral Daily Hampton Abbot, MD   10 mg at 01/01/17 0747  . divalproex (DEPAKOTE ER) 24 hr tablet 1,500 mg  1,500 mg Oral QHS Nwoko, Agnes I, NP   1,500 mg at 12/31/16 2150  . hydrOXYzine (ATARAX/VISTARIL) tablet 50 mg  50 mg Oral TID PRN Rankin, Shuvon B, NP   50 mg at 01/01/17 0752  . ibuprofen (ADVIL,MOTRIN) tablet 600 mg  600 mg Oral Q6H PRN Cobos, Myer Peer, MD   600 mg at 12/31/16 0818  . lamoTRIgine (LAMICTAL) tablet 200 mg  200 mg Oral QHS Rankin, Shuvon B, NP   200 mg at 12/31/16 2151  . lurasidone (LATUDA) tablet 80 mg  80 mg Oral Q supper Rankin, Shuvon B, NP   80 mg at 12/31/16 1702  . magnesium hydroxide (MILK OF MAGNESIA) suspension 30 mL  30 mL Oral Daily PRN Rankin, Shuvon B, NP      . metoprolol succinate (TOPROL-XL) 24 hr tablet 100 mg  100 mg Oral Daily Rankin, Shuvon B, NP   100 mg  at 01/01/17 0748  . ondansetron (ZOFRAN-ODT) disintegrating tablet 4 mg  4 mg Oral Q8H PRN Cobos, Myer Peer, MD      . traZODone (DESYREL) tablet 50 mg  50 mg Oral QHS PRN Rankin, Shuvon B, NP   50 mg at 12/31/16 2151    Lab Results:  No results found for this or any previous visit (from the past 48 hour(s)).  Blood Alcohol level:  Lab Results  Component Value Date   ETH <10 12/29/2016   ETH <5 31/51/7616    Metabolic Disorder Labs: Lab Results  Component Value Date   HGBA1C 5.1 12/29/2016   MPG 99.67 12/29/2016   MPG 100 07/19/2016   Lab Results  Component Value Date   PROLACTIN 52.8 (H) 07/19/2016   PROLACTIN  08/21/2009    14.3 (NOTE)     Reference Ranges:                 Female:                       2.1 -  17.1 ng/ml                 Female:   Pregnant          9.7 - 208.5 ng/mL                           Non Pregnant      2.8 -  29.2 ng/mL                           Post  Menopausal   1.8 -  20.3 ng/mL                     Lab Results  Component Value Date   CHOL 198 12/29/2016   TRIG 136 12/29/2016   HDL 53 12/29/2016   CHOLHDL 3.7 12/29/2016   VLDL 27 12/29/2016   LDLCALC 118 (H) 12/29/2016   LDLCALC 100 (H) 07/19/2016    Physical Findings: AIMS: Facial and Oral Movements Muscles of Facial Expression: None, normal Lips and Perioral Area: None, normal Jaw: None, normal Tongue: None, normal,Extremity Movements Upper (arms, wrists, hands, fingers): None, normal Lower (legs, knees, ankles, toes): None, normal, Trunk Movements Neck, shoulders, hips: None, normal,  Overall Severity Severity of abnormal movements (highest score from questions above): None, normal Incapacitation due to abnormal movements: None, normal Patient's awareness of abnormal movements (rate only patient's report): No Awareness, Dental Status Current problems with teeth and/or dentures?: No Does patient usually wear dentures?: No  CIWA:    COWS:     Musculoskeletal: Strength & Muscle Tone:  within normal limits Gait & Station: normal Patient leans: N/A  Psychiatric Specialty Exam: Physical Exam  ROS no headache, no chest pain, no shortness of breath, no vomiting   Blood pressure 106/73, pulse 76, temperature 98.3 F (36.8 C), temperature source Oral, resp. rate 18, height 5' 10"  (1.778 m), weight 124.7 kg (275 lb), last menstrual period 12/23/2016, SpO2 100 %.Body mass index is 39.46 kg/m.  General Appearance: Well Groomed  Eye Contact:  Good  Speech:  Normal Rate- no pressured or loud speech  Volume:  Normal  Mood:  reports persistent depression, partially improved compared to admission and ongoing sense of irritability  Affect:  subjectively irritable, affect improves during session  Thought Process:  Linear and Descriptions of Associations: Intact  Orientation:  Full (Time, Place, and Person)  Thought Content:  no hallucinations, no delusions expressed   Suicidal Thoughts:  Continues to report passive thoughts of dying, but denies  any active suicidal ideations or any self injurious ideations and  contracts for safety .   Homicidal Thoughts:  No denies violent or homicidal ideations  Memory:  recent and remote grossly intact   Judgement:  improving   Insight:  improving   Psychomotor Activity:  Normal  Concentration:  Concentration: Good and Attention Span: Good  Recall:  Good  Fund of Knowledge:  Good  Language:  Good  Akathisia:  Negative  Handed:  Right  AIMS (if indicated):     Assets:  Communication Skills Desire for Improvement Resilience  ADL's:  Intact  Cognition:  WNL  Sleep:  Number of Hours: 6   Assessment - today patient reports increased subjective sense of irritability, and feeling vaguely " bothered" by others and by environmental stimulus such as talking, laughing , noises. No psychotic symptoms noted or reported . No overt hypomanic or manic symptoms noted . Affect improves with reassurance and support. Increased irritability may be temporally  related to increasing Zoloft dose .  We discussed options- will continue mood stabilizer management with Depakote ER, Lamictal, Latuda, and D/C Zoloft   Treatment Plan Summary: Treatment Plan reviewed as below today 12/2  Daily contact with patient to assess and evaluate symptoms and progress in treatment, Medication management, Plan inpatient treatment  and medications as below  Encourage group and milieu participation to work on coping skills and symptom reduction Treatment team working on disposition planning options  Continue Depakote ER 1500 mgrs QHS for mood disorder Continue Lamictal 200 mgrs QDAY for mood disorder D/C Zoloft - see rationale above  Continue Latuda 80 mgrs QDAY for mood disorder Continue Trazodone 50 mgrs QHS PRN for insomnia as needed  Jenne Campus, MD 01/01/2017, 1:26 PM   Patient ID: [name] Daisey, female   DOB: 06-20-1970, 46 y.o.   MRN: [id number]

## 2017-01-02 ENCOUNTER — Ambulatory Visit: Payer: PPO | Admitting: Occupational Therapy

## 2017-01-02 DIAGNOSIS — G43909 Migraine, unspecified, not intractable, without status migrainosus: Secondary | ICD-10-CM

## 2017-01-02 DIAGNOSIS — F332 Major depressive disorder, recurrent severe without psychotic features: Principal | ICD-10-CM

## 2017-01-02 MED ORDER — LITHIUM CARBONATE 300 MG PO CAPS
900.0000 mg | ORAL_CAPSULE | Freq: Every day | ORAL | Status: DC
  Administered 2017-01-02 – 2017-01-04 (×3): 900 mg via ORAL
  Filled 2017-01-02 (×6): qty 3

## 2017-01-02 NOTE — Progress Notes (Signed)
D:Pt has a flat/sad affect this morning. She reports being irritable and agitated toward a peer on the unit. Pt reports "I feel like I could jump out of my skin."  She has si thoughts and c/o a headache.   A:Offered support, redirection and 15 minute checks. Gave prn medication as ordered. Reported pt complaints to NP. Pt is having her meal brought back to the unit due to headache and agitation.  R:Pt is waiting to see the MD to discuss medication changes and mood. She contracts with staff for safety. Safety maintained on the unit.

## 2017-01-02 NOTE — Progress Notes (Signed)
Adult Psychoeducational Group Note  Date:  01/02/2017 Time:  11:56 PM  Group Topic/Focus:  Wrap-Up Group:   The focus of this group is to help patients review their daily goal of treatment and discuss progress on daily workbooks.  Participation Level:  Active  Participation Quality:  Sharing  Affect:  Blunted  Cognitive:  Alert  Insight: Lacking  Engagement in Group:  Defensive  Modes of Intervention:  Discussion  Additional Comments:  Patient stated that she was having a bad day due to the doctor stopping her meds to start her on new ones. Patient wants the right combination of meds before she leaves and end up coming back.   Noel Christmas 01/02/2017, 11:56 PM

## 2017-01-02 NOTE — Progress Notes (Signed)
[facility] MD Progress Note  01/02/2017 3:50 PM [name]  MRN:  [id number] Subjective:    46 y.o Caucasian female, single, no kids. 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 Background history of Bipolar disorder and Borderline PD. Patient was referred by her therapist. She had expressed thoughts of stabbing herself.  Main stressor was recent sales and demands it created at her work place. Routine labs are essentially normal, toxicology is negative,  UDS negative , no alcohol.   Chart reviewed today. Patient discussed at team today.  Staff reports that she has been very irritated lately. She has been isolative. She splits staff as she tends to devalue some and idealize others. She slept well last night. She has been adherent with her medications.   Seen today. Patient says she has been feeling very irritable lately. She was taken off SSRI two days ago. Says she just feels like putting her head through a wall. She could not tolerate the cafeteria this afternoon. Says she just wants to feel better. Reports stress at work and at home. Says she has not been getting along much with her mother. Says she came in to get better. Patient says she slept well last night. Says she continues to have suicidal thoughts off an on. No abnormal perception. No delusional preoccupation. We explored medications she has taken over the years. She had paraesthesia with Abilify. Says she had taken taken Risperidone in the past but gained a lot of weight on it. She used to be on higher dose of Latuda. It was decreased during her last admission. No clear benefit from Lamictal. Depakote is used treat her migraine headache.  We have agreed to add Lithium. Patient has taken this in the past. She consented to treatment after we reviewed the risks and benefits.    Principal Problem: <principal problem not specified> Diagnosis:   Patient Active Problem List   Diagnosis Date Noted  . MDD (major depressive disorder), recurrent  severe, without psychosis ([place]) [F33.2] 12/28/2016  . Muscle strain [T14.8XXA] 09/19/2016  . Right foot injury, subsequent encounter [S99.921D] 08/25/2016  . Low back pain [M54.5] 06/27/2016  . Left wrist injury, subsequent encounter [S69.92XD] 06/27/2016  . Pain of left thumb [M79.645] 10/07/2015  . 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 Strain of left thumb [IMO0002] 02/11/2015  . Strain of right forearm [S56.911A] 02/11/2015  . Right shoulder pain [M25.511] 12/31/2014  . Injury of right little finger [S69.91XA] 12/31/2014  . Episodic cluster headache, not intractable [G44.019] 12/02/2014  . 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 Hypersomnia, recurrent [G47.13] 12/02/2014  . 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 Lower back injury [S39.92XA] 09/30/2014  . Bipolar I disorder, most recent episode depressed ([place]) [F31.30]   . MDD (major depressive disorder), recurrent, severe, with psychosis ([place]) [F33.3] 09/23/2014  . Injury of left index finger [S69.92XA] 08/20/2014  . Right ankle sprain [S93.401A] 06/01/2014  . Contusion, multiple sites [T07.XXXA] 06/01/2014  . Strain of right gastrocnemius muscle [S86.111A] 06/01/2014  . Phonophobia [F40.298] 05/04/2014  . Photophobia of both eyes [H53.143] 05/04/2014  . Emotionally unstable borderline personality disorder ([place]) [F60.3] 05/04/2014  . Nausea with vomiting [R11.2] 05/04/2014  . Mixed bipolar I disorder ([place]) [F31.60]   . Bipolar I disorder, most recent episode mixed ([place]) [F31.60] 04/18/2014  . Bipolar affective disorder, depressed, mild ([place]) [F31.31] 04/12/2014  . Suicidal ideation [R45.851] 04/12/2014  . Injury  of right shoulder and upper arm [S49.91XA] 02/17/2014  . Left leg pain [M79.605] 06/03/2013  . Right hip pain [M25.551] 06/03/2013  . Migraine with status migrainosus  [G43.901] 01/08/2013  . Personality disorder ([place]) [F60.9]   . Chronic migraine [G43.709] 05/08/2012  . Contact dermatitis [L25.9] 11/27/2011  . Major depressive disorder, recurrent episode ([place]) [F33.9] 10/27/2011  . Generalized anxiety disorder [F41.1] 10/27/2011  . 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 Borderline personality disorder ([place]) [F60.3] 10/27/2011  . Right foot pain [M79.671] 09/28/2011  . 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 Ganglion cyst [M67.40] 09/29/2009  . 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 BIPOLAR DISORDER UNSPECIFIED [F31.9] 06/09/2008   Total Time spent with patient: 30 minutes  Past Psychiatric History: As in H&P  Past Medical History:  Past Medical History:  Diagnosis Date  . Achilles tendinitis   . Achilles tendinitis   . ADHD (attention deficit hyperactivity disorder)   . Allergy   . Arthritis   . Bipolar affective ([place])   . Bipolar disorder ([place])   . Cataracts, bilateral   . Eczema   . Ganglion cyst 09/29/2009   left wrist (2 cyst)  . Hyperprolactinemia ([place])   . Hypertension   . Lipoma   . Migraine   . Personality disorder ([place])   . Pes planus     Past Surgical History:  Procedure Laterality Date  . ANKLE SURGERY  12/88   left   . chest nodule  1990?   rt chest wall nodule removal  . GANGLION CYST EXCISION  2011  . lipoma removal    . right bunioectomy    . SHOULDER SURGERY  01/13/2011   right, partial tear  . tumor resection left thigh     Family History:  Family History  Problem Relation Age of Onset  . Hypertension Mother   . Hyperlipidemia Mother   . Heart attack Father   . Heart disease Father   . Hypertension Father   . Bipolar disorder Father   . Diabetes Paternal Grandfather   . Heart disease Maternal Aunt   . Breast cancer Maternal Aunt   . Heart disease Maternal Grandmother   . Cancer Maternal  Grandmother        colon   Family Psychiatric  History: As in H&P Social History:  Social History   Substance and Sexual Activity  Alcohol Use No  . Alcohol/week: 0.0 oz     Social History   Substance and Sexual Activity  Drug Use No    Social History   Socioeconomic History  . Marital status: Single    Spouse name: None  . Number of children: 0  . Years of education: None  . Highest education level: None  Social Needs  . Financial resource strain: None  . Food insecurity - worry: None  . Food insecurity - inability: None  . Transportation needs - medical: None  . Transportation needs - non-medical: None  Occupational History    Employer: [employer]  Tobacco Use  . Smoking status: Never Smoker  . Smokeless tobacco: Never Used  Substance and Sexual Activity  . Alcohol use: No    Alcohol/week: 0.0 oz  . Drug use: No  . Sexual activity: No    Birth control/protection: Pill  Other Topics Concern  . None  Social History Narrative   Caffeine  2 sodas daily, 1 cup coffee daily.  Additional Social History:    Pain Medications: please see mar Prescriptions: please see mar Over the Counter: please see mar History of alcohol / drug use?: No history of alcohol / drug abuse Longest period of sobriety (when/how long): NA                    Sleep: Good  Appetite:  Good  Current Medications: Current Facility-Administered Medications  Medication Dose Route Frequency Provider Last Rate Last Dose  . alum & mag hydroxide-simeth (MAALOX/MYLANTA) 200-200-20 MG/5ML suspension 30 mL  30 mL Oral Q4H PRN Rankin, Shuvon B, NP      . cetirizine (ZYRTEC) tablet 10 mg  10 mg Oral Daily Hampton Abbot, MD   10 mg at 01/02/17 0810  . divalproex (DEPAKOTE ER) 24 hr tablet 1,500 mg  1,500 mg Oral QHS Nwoko, Agnes I, NP   1,500 mg at 01/01/17 2142  . hydrOXYzine (ATARAX/VISTARIL) tablet 50 mg  50 mg Oral TID PRN Rankin, Shuvon B, NP   50 mg at 01/02/17 1421  . ibuprofen  (ADVIL,MOTRIN) tablet 600 mg  600 mg Oral Q6H PRN Cobos, Myer Peer, MD   600 mg at 01/02/17 1159  . lamoTRIgine (LAMICTAL) tablet 200 mg  200 mg Oral QHS Rankin, Shuvon B, NP   200 mg at 01/01/17 2142  . lurasidone (LATUDA) tablet 80 mg  80 mg Oral Q supper Rankin, Shuvon B, NP   80 mg at 01/01/17 1639  . magnesium hydroxide (MILK OF MAGNESIA) suspension 30 mL  30 mL Oral Daily PRN Rankin, Shuvon B, NP      . metoprolol succinate (TOPROL-XL) 24 hr tablet 100 mg  100 mg Oral Daily Rankin, Shuvon B, NP   100 mg at 01/02/17 0810  . ondansetron (ZOFRAN-ODT) disintegrating tablet 4 mg  4 mg Oral Q8H PRN Cobos, Myer Peer, MD      . traZODone (DESYREL) tablet 50 mg  50 mg Oral QHS PRN Rankin, Shuvon B, NP   50 mg at 01/01/17 2142    Lab Results: No results found for this or any previous visit (from the past 48 hour(s)).  Blood Alcohol level:  Lab Results  Component Value Date   ETH <10 12/29/2016   ETH <5 36/62/9476    Metabolic Disorder Labs: Lab Results  Component Value Date   HGBA1C 5.1 12/29/2016   MPG 99.67 12/29/2016   MPG 100 07/19/2016   Lab Results  Component Value Date   PROLACTIN 52.8 (H) 07/19/2016   PROLACTIN  08/21/2009    14.3 (NOTE)     Reference Ranges:                 Female:                       2.1 -  17.1 ng/ml                 Female:   Pregnant          9.7 - 208.5 ng/mL                           Non Pregnant      2.8 -  29.2 ng/mL                           Post  Menopausal   1.8 -  20.3 ng/mL  Lab Results  Component Value Date   CHOL 198 12/29/2016   TRIG 136 12/29/2016   HDL 53 12/29/2016   CHOLHDL 3.7 12/29/2016   VLDL 27 12/29/2016   LDLCALC 118 (H) 12/29/2016   LDLCALC 100 (H) 07/19/2016    Physical Findings: AIMS: Facial and Oral Movements Muscles of Facial Expression: None, normal Lips and Perioral Area: None, normal Jaw: None, normal Tongue: None, normal,Extremity Movements Upper (arms, wrists, hands, fingers): None,  normal Lower (legs, knees, ankles, toes): None, normal, Trunk Movements Neck, shoulders, hips: None, normal, Overall Severity Severity of abnormal movements (highest score from questions above): None, normal Incapacitation due to abnormal movements: None, normal Patient's awareness of abnormal movements (rate only patient's report): No Awareness, Dental Status Current problems with teeth and/or dentures?: No Does patient usually wear dentures?: No  CIWA:  CIWA-Ar Total: 1 COWS:  COWS Total Score: 1  Musculoskeletal: Strength & Muscle Tone: within normal limits Gait & Station: normal Patient leans: N/A  Psychiatric Specialty Exam: Physical Exam  Constitutional: She is oriented to person, place, and time. She appears well-developed and well-nourished.  HENT:  Head: Normocephalic and atraumatic.  Respiratory: Effort normal.  Neurological: She is alert and oriented to person, place, and time.  Psychiatric:  As above     ROS  Blood pressure 107/78, pulse 73, temperature 98.1 F (36.7 C), temperature source Oral, resp. rate 16, height 5\' 10"  (1.778 m), weight 124.7 kg (275 lb), last menstrual period 12/23/2016, SpO2 100 %.Body mass index is 39.46 kg/m.  General Appearance: Overweight. Very confrontational initially. Settled with time and teared up. Not internally distracted.   Eye Contact:  Fair  Speech:  Increased tone, not presured  Volume:  A bit loud.   Mood:  Angry and Irritable  Affect:  Congruent  Thought Process:  Linear  Orientation:  Full (Time, Place, and Person)  Thought Content:  No delusional theme. No hallucination in any modality.   Suicidal Thoughts:  Off and on. No intent to act now as she wants to get better.  Homicidal Thoughts:  No  Memory:  Unable to assess at this time.   Judgement:  Fair  Insight:  Good  Psychomotor Activity:  Increased  Concentration:  Concentration: Good and Attention Span: Good  Recall:  Unable to assess at this time.   Fund of  Knowledge:  Fair  Language:  Good  Akathisia:  Negative  Handed:    AIMS (if indicated):     Assets:  Desire for Improvement Financial Resources/Insurance Housing Resilience Vocational/Educational  ADL's:  Intact  Cognition:  WNL  Sleep:  Number of Hours: 6.75     Treatment Plan Summary: Patient is dysphoric and irritable. SSRI discontinuation and underlying Bipolar disorder could account for this. Patient is currently on a lot of mood stabilizer. Depakote inhibits metabolism of Lamotrigine. Technically her levels would be above her current dose. Patient is not sure of any clinical benefit with Lamotrigine. We agreed to add Lithium and evaluate her further. Down the line we could wean off Lamotrigine as it has not really helped.   Psychiatric: Bipolar Disorder BLPD  Medical: Migraine  Psychosocial:   PLAN: 1. Add Lithium 900 mg HS 2. Continue other medications at current dose 3. Continue to monitor mood, behavior and interaction with peers 4. Encourage unit groups and activities   [place], MD 01/02/2017, 3:50 PM

## 2017-01-02 NOTE — Progress Notes (Signed)
Patient ID: [name], female   DOB: Jan 12, 1971, 47 y.o.   MRN: [id number]  DAR Note: Pt is very flat and withdrawn to self even while in the dayroom. Pt at the time of endorsed moderate anxiety and depression; "I am going to be started on some new antidepressant; we will see how that works." Pt denied pain, SI, HI of AVH. Support, encouragement, and safe environment provided.  15-minute safety checks continue. All Pt's questions and concerns addressed.

## 2017-01-02 NOTE — Plan of Care (Signed)
Pt is taking medications as prescribed.

## 2017-01-02 NOTE — Progress Notes (Signed)
Recreation Therapy Notes  Animal-Assisted Activity (AAA) Program Checklist/Progress Notes Patient Eligibility Criteria Checklist & Daily Group note for Rec TxIntervention  Date: 12.04.2018 Time: 2:45pm Location: [address] Dayroom   AAA/T Program Assumption of Risk Form signed by Patient/ or Parent Legal Guardian Yes  Patient is free of allergies or sever asthma Yes  Patient reports no fear of animals Yes  Patient reports no history of cruelty to animals Yes  Patient understands his/her participation is voluntary Yes  Behavioral Response: Did not attend.   Laureen Ochs Tatyanna Cronk, LRT/CTRS       Dj Senteno L 01/02/2017 3:01 PM

## 2017-01-02 NOTE — [facility] Group Notes (Signed)
Pt attended and participated in group and rated their day a 5/10. Pt goal was to get their meds regulated. Pt was able to achieve their goal.
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
## 2017-01-03 NOTE — Progress Notes (Signed)
Patient was given zofran after dinner because she felt nauseated.  Patient returned to medication window and stated she had vomited in toilet.  Patient refused ginger ale.  EVS was called and cleaned her toilet.

## 2017-01-03 NOTE — Progress Notes (Signed)
Recreation Therapy Notes  Date:  01/03/17  Time: 0930 Location: [address] Group Room  Group Topic: Stress Management  Goal Area(s) Addresses:  Patient will verbalize importance of using healthy stress management.  Patient will identify positive emotions associated with healthy stress management.   Intervention: Stress Management  Activity :  Meditation.  LRT introduced the stress management technique of meditation.  Patients were to follow along with the meditation as it guided them on letting things that are burdensome in order to progress.  Education:  Stress Management, Discharge Planning.   Education Outcome: Acknowledges edcuation/In group clarification offered/Needs additional education  Clinical Observations/Feedback: Pt did not attend group.    Victorino Sparrow, LRT/CTRS         Victorino Sparrow A 01/03/2017 12:01 PM

## 2017-01-03 NOTE — Tx Team (Signed)
Interdisciplinary Treatment and Diagnostic Plan Update  01/03/2017 Time of Session: 0830AM Katharina Jehle Enberg MRN: [id number]  Principal Diagnosis: MDD, recurrent, severe, without psychosis  Secondary Diagnoses: Active Problems:   MDD (major depressive disorder), recurrent severe, without psychosis ([place])   Current Medications:  Current Facility-Administered Medications  Medication Dose Route Frequency Provider Last Rate Last Dose  . alum & mag hydroxide-simeth (MAALOX/MYLANTA) 200-200-20 MG/5ML suspension 30 mL  30 mL Oral Q4H PRN Rankin, Shuvon B, NP      . cetirizine (ZYRTEC) tablet 10 mg  10 mg Oral Daily Hampton Abbot, MD   10 mg at 01/03/17 0744  . divalproex (DEPAKOTE ER) 24 hr tablet 1,500 mg  1,500 mg Oral QHS Nwoko, Agnes I, NP   1,500 mg at 01/02/17 2145  . hydrOXYzine (ATARAX/VISTARIL) tablet 50 mg  50 mg Oral TID PRN Rankin, Shuvon B, NP   50 mg at 01/03/17 0746  . ibuprofen (ADVIL,MOTRIN) tablet 600 mg  600 mg Oral Q6H PRN Cobos, Myer Peer, MD   600 mg at 01/03/17 0746  . lamoTRIgine (LAMICTAL) tablet 200 mg  200 mg Oral QHS Rankin, Shuvon B, NP   200 mg at 01/02/17 2145  . lithium carbonate capsule 900 mg  900 mg Oral QHS Artist Beach, MD   900 mg at 01/02/17 2145  . lurasidone (LATUDA) tablet 80 mg  80 mg Oral Q supper Rankin, Shuvon B, NP   80 mg at 01/02/17 1701  . magnesium hydroxide (MILK OF MAGNESIA) suspension 30 mL  30 mL Oral Daily PRN Rankin, Shuvon B, NP      . metoprolol succinate (TOPROL-XL) 24 hr tablet 100 mg  100 mg Oral Daily Rankin, Shuvon B, NP   100 mg at 01/03/17 0745  . ondansetron (ZOFRAN-ODT) disintegrating tablet 4 mg  4 mg Oral Q8H PRN Cobos, Myer Peer, MD      . traZODone (DESYREL) tablet 50 mg  50 mg Oral QHS PRN Rankin, Shuvon B, NP   50 mg at 01/02/17 2145   PTA Medications: Medications Prior to Admission  Medication Sig Dispense Refill Last Dose  . diclofenac (VOLTAREN) 75 MG EC tablet take 1 tablet by mouth twice a day 60 tablet 1  Past Week at Unknown time  . divalproex (DEPAKOTE ER) 500 MG 24 hr tablet take 3 tablets by mouth at bedtime 90 tablet 0 Past Week at Unknown time  . hydrOXYzine (ATARAX/VISTARIL) 50 MG tablet Take 1 tablet (50 mg total) by mouth 3 (three) times daily as needed for anxiety. 60 tablet 0 Past Week at Unknown time  . lamoTRIgine (LAMICTAL) 200 MG tablet Take 1 tablet (200 mg total) by mouth at bedtime. 30 tablet 0 Past Week at Unknown time  . levocetirizine (XYZAL) 5 MG tablet Take 5 mg every evening by mouth.  0 Past Week at Unknown time  . lurasidone (LATUDA) 80 MG TABS tablet Take 1 tablet (80 mg total) by mouth daily with supper. 30 tablet 0 Past Week at Unknown time  . metoprolol succinate (TOPROL-XL) 100 MG 24 hr tablet Take 1 tablet (100 mg total) by mouth daily. Take with or immediately following a meal. 30 tablet 0 Past Week at Unknown time  . sertraline (ZOLOFT) 50 MG tablet Take 1 tablet (50 mg total) by mouth daily. 30 tablet 0 Past Week at Unknown time  . traZODone (DESYREL) 50 MG tablet Take 1 tablet (50 mg total) by mouth at bedtime as needed for sleep. 30 tablet 0 Past Week  at Unknown time  . SUMAtriptan 6 MG/0.5ML SOAJ   0 Taking    Patient Stressors: Financial difficulties Occupational concerns  Patient Strengths: Average or above average intelligence Communication skills General fund of knowledge Motivation for treatment/growth Physical Health Supportive family/friends  Treatment Modalities: Medication Management, Group therapy, Case management,  1 to 1 session with clinician, Psychoeducation, Recreational therapy.   Physician Treatment Plan for Primary Diagnosis: MDD, recurrent, severe, without psychosis  Medication Management: Evaluate patient's response, side effects, and tolerance of medication regimen.  Therapeutic Interventions: 1 to 1 sessions, Unit Group sessions and Medication administration.  Evaluation of Outcomes: Progressing  Physician Treatment Plan for  Secondary Diagnosis: Active Problems:   MDD (major depressive disorder), recurrent severe, without psychosis ([place])  Long Term Goal(s): Improvement in symptoms so as ready for discharge Improvement in symptoms so as ready for discharge   Short Term Goals: Ability to identify changes in lifestyle to reduce recurrence of condition will improve Ability to maintain clinical measurements within normal limits will improve Ability to verbalize feelings will improve Ability to disclose and discuss suicidal ideas Ability to demonstrate self-control will improve Ability to identify and develop effective coping behaviors will improve     Medication Management: Evaluate patient's response, side effects, and tolerance of medication regimen.  Therapeutic Interventions: 1 to 1 sessions, Unit Group sessions and Medication administration.  Evaluation of Outcomes: Progressing   RN Treatment Plan for Primary Diagnosis: MDD, recurrent, severe, without psychosis Long Term Goal(s): Knowledge of disease and therapeutic regimen to maintain health will improve  Short Term Goals: Ability to remain free from injury will improve, Ability to demonstrate self-control and Ability to disclose and discuss suicidal ideas  Medication Management: RN will administer medications as ordered by provider, will assess and evaluate patient's response and provide education to patient for prescribed medication. RN will report any adverse and/or side effects to prescribing provider.  Therapeutic Interventions: 1 on 1 counseling sessions, Psychoeducation, Medication administration, Evaluate responses to treatment, Monitor vital signs and CBGs as ordered, Perform/monitor CIWA, COWS, AIMS and Fall Risk screenings as ordered, Perform wound care treatments as ordered.  Evaluation of Outcomes: Progressing   LCSW Treatment Plan for Primary Diagnosis: MDD, recurrent, severe, without psychosis Long Term Goal(s): Safe transition to  appropriate next level of care at discharge, Engage patient in therapeutic group addressing interpersonal concerns.  Short Term Goals: Engage patient in aftercare planning with referrals and resources, Facilitate patient progression through stages of change regarding substance use diagnoses and concerns and Increase skills for wellness and recovery  Therapeutic Interventions: Assess for all discharge needs, 1 to 1 time with Social worker, Explore available resources and support systems, Assess for adequacy in community support network, Educate family and significant other(s) on suicide prevention, Complete Psychosocial Assessment, Interpersonal group therapy.  Evaluation of Outcomes: Progressing   Progress in Treatment: Attending groups: No. New to unit. Continuing to assess.  Participating in groups: No. Taking medication as prescribed: Yes. Toleration medication: Yes. Family/Significant other contact made: No, will contact:  family member if patient consents  Patient understands diagnosis: Yes. Discussing patient identified problems/goals with staff: Yes. Medical problems stabilized or resolved: Yes. Denies suicidal/homicidal ideation: Yes. Issues/concerns per patient self-inventory: No. Other: n/a   New problem(s) identified: No, Describe:  n/a  New Short Term/Long Term Goal(s): elimination of SI thoughts; medication management for mood stabilization; development of comprehensive mental wellness plan.   Discharge Plan or Barriers: . She lives with her mother and works p/t at [employer]. Pt  goes to [facility] for medication management with Pauline Good NP and [facility] for therapy with Stanton Kidney Roberts--follow-up appts have been made. Pt also agreeable to [facility] with Dellia Nims starting on Wed, 01/10/17, as this was helpful for her in the past. Pt provided with [facility] pamphlet for additional community supports.   Reason for Continuation of Hospitalization: Anxiety Medication  management   Estimated Length of Stay: Thursday, 01/04/17  Attendees: Patient: 01/03/2017 10:55 AM  Physician: Dr. Sanjuana Letters MD; Dr. Nancy Fetter MD 01/03/2017 10:55 AM  Nursing: Alfonzo Beers RN 01/03/2017 10:55 AM  RN Care Manager: Lars Pinks CM 01/03/2017 10:55 AM  Social Worker: Maxie Better, LCSW 01/03/2017 10:55 AM  Recreational Therapist: x 01/03/2017 10:55 AM  Other: Lindell Spar NP; Darnelle Maffucci Money NP 01/03/2017 10:55 AM  Other:  01/03/2017 10:55 AM  Other: 01/03/2017 10:55 AM    Scribe for Treatment Team: [place], LCSW 01/03/2017 10:55 AM
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
## 2017-01-03 NOTE — Progress Notes (Signed)
D   Pt complaining of some nausea today but was able to eat peanut butter and crackers tonight for snack before she took her bedtime medication     She did not go to group and has not been interacting much with other patients    She stood at the medication and rambled on and on about her mother and how she wished she didn't have to live with her   She said her mother told her to pull it up by the boot strap or stay in the hospital   A   Verbal support given   Medications administered and effectiveness monitored    Q 15 min checks R   Pt remains safe at present

## 2017-01-03 NOTE — Plan of Care (Signed)
Nurse discussed depression/anxiety/coping skills with patient.

## 2017-01-03 NOTE — Progress Notes (Signed)
Psychoeducational Group Note  Date:  01/03/2017 Time:  2312  Group Topic/Focus:  Wrap-Up Group:   The focus of this group is to help patients review their daily goal of treatment and discuss progress on daily workbooks.  Participation Level: Did Not Attend  Participation Quality:  Not Applicable  Affect:  Not Applicable  Cognitive:  Not Applicable  Insight:  Not Applicable  Engagement in Group: Not Applicable  Additional Comments:  The patient did not attend group since she was asleep in her bed.   Renetta Suman S 01/03/2017, 11:12 PM
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
## 2017-01-04 ENCOUNTER — Telehealth: Payer: Self-pay

## 2017-01-04 NOTE — Telephone Encounter (Signed)
Patient would like a call back to discuss some med changes, she is currently at [facility]. She asked that Jinny Blossom return her call and not a nurse. She can be reached at  [phone] and her code is [id number].

## 2017-01-04 NOTE — Progress Notes (Signed)
[facility] MD Progress Note  01/04/2017 2:17 PM Brailynn Breth [place]  MRN:  [id number] Subjective:    46 y.o Caucasian female, single, no kids. 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 Background history of Bipolar disorder and Borderline PD. Patient was referred by her therapist. She had expressed thoughts of stabbing herself.  Main stressor was recent sales and demands it created at her work place. Routine labs are essentially normal, toxicology is negative,  UDS negative , no alcohol.   Chart reviewed today. Patient discussed at team today. Her mother wants her home before the storm.   Staff reports that she has been socializing more with peers. She was a bit anxious after she spoke with her mom. No behavioral issues.  Seen today. Maintains progress. Says her mom is coming in to visit her today. Patient says her mom is worried about the storm. Patient is open to discharge tomorrow once we have her lithium levels.  No hallucination in any modality. No other form of perceptual abnormality. No feelings of things being unreal. No feeling of impending doom. Not expressing any delusional statement. No suicidal thoughts. No homicidal thoughts. No thoughts of violence. Not expressing any new stressors at home.    Principal Problem: <principal problem not specified> Diagnosis:   Patient Active Problem List   Diagnosis Date Noted  . MDD (major depressive disorder), recurrent severe, without psychosis ([place]) [F33.2] 12/28/2016  . Muscle strain [T14.8XXA] 09/19/2016  . Right foot injury, subsequent encounter [S99.921D] 08/25/2016  . Low back pain [M54.5] 06/27/2016  . Left wrist injury, subsequent encounter [S69.92XD] 06/27/2016  . Pain of left thumb [M79.645] 10/07/2015  . 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 Strain of left thumb [IMO0002] 02/11/2015  . Strain of right forearm [S56.911A] 02/11/2015  . Right shoulder pain [M25.511] 12/31/2014  . Injury of right little finger [S69.91XA]  12/31/2014  . Episodic cluster headache, not intractable [G44.019] 12/02/2014  . 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 Hypersomnia, recurrent [G47.13] 12/02/2014  . 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 Lower back injury [S39.92XA] 09/30/2014  . Bipolar I disorder, most recent episode depressed ([place]) [F31.30]   . MDD (major depressive disorder), recurrent, severe, with psychosis ([place]) [F33.3] 09/23/2014  . Injury of left index finger [S69.92XA] 08/20/2014  . Right ankle sprain [S93.401A] 06/01/2014  . Contusion, multiple sites [T07.XXXA] 06/01/2014  . Strain of right gastrocnemius muscle [S86.111A] 06/01/2014  . Phonophobia [F40.298] 05/04/2014  . Photophobia of both eyes [H53.143] 05/04/2014  . Emotionally unstable borderline personality disorder ([place]) [F60.3] 05/04/2014  . Nausea with vomiting [R11.2] 05/04/2014  . Mixed bipolar I disorder ([place]) [F31.60]   . Bipolar I disorder, most recent episode mixed ([place]) [F31.60] 04/18/2014  . Bipolar affective disorder, depressed, mild ([place]) [F31.31] 04/12/2014  . Suicidal ideation [R45.851] 04/12/2014  . Injury of right shoulder and upper arm [S49.91XA] 02/17/2014  . Left leg pain [M79.605] 06/03/2013  . Right hip pain [M25.551] 06/03/2013  . Migraine with status migrainosus [G43.901] 01/08/2013  . Personality disorder ([place]) [F60.9]   . Chronic migraine [G43.709] 05/08/2012  . Contact dermatitis [L25.9] 11/27/2011  . Major depressive disorder, recurrent episode ([place]) [F33.9] 10/27/2011  . Generalized anxiety disorder [F41.1] 10/27/2011  . 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 Borderline personality disorder ([place]) [F60.3] 10/27/2011  . Right foot pain [M79.671] 09/28/2011  . 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 Ganglion cyst [M67.40] 09/29/2009  . 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 BIPOLAR DISORDER UNSPECIFIED [F31.9] 06/09/2008   Total Time spent with patient: 30 minutes  Past Psychiatric History: As in H&P  Past Medical History:  Past Medical History:  Diagnosis Date  . Achilles tendinitis   . Achilles tendinitis   . ADHD (attention deficit hyperactivity disorder)   . Allergy   . Arthritis   . Bipolar affective ([place])   . Bipolar disorder ([place])   . Cataracts, bilateral   . Eczema   . Ganglion cyst 09/29/2009   left wrist (2 cyst)  . Hyperprolactinemia ([place])   . Hypertension   . Lipoma   . Migraine   . Personality disorder ([place])   . Pes planus     Past Surgical History:  Procedure Laterality Date  . ANKLE SURGERY  12/88   left   . chest nodule  1990?   rt chest wall nodule removal  . GANGLION CYST EXCISION  2011  . lipoma removal    . right bunioectomy    . SHOULDER SURGERY  01/13/2011   right, partial tear  . tumor resection left thigh     Family History:  Family History  Problem Relation Age of Onset  . Hypertension Mother   . Hyperlipidemia Mother   . Heart attack Father   . Heart disease Father   . Hypertension Father   . Bipolar disorder Father   . Diabetes Paternal Grandfather   . Heart disease Maternal Aunt   . Breast cancer Maternal Aunt   . Heart disease Maternal Grandmother   . Cancer Maternal Grandmother        colon   Family Psychiatric  History: As in H&P Social History:  Social History   Substance and Sexual Activity  Alcohol Use No  . Alcohol/week: 0.0 oz     Social History   Substance and Sexual Activity  Drug Use No    Social History   Socioeconomic History  . Marital status: Single    Spouse name: None  . Number of children: 0  . Years of education: None  . Highest education level: None  Social Needs  . Financial resource strain: None  . Food insecurity - worry: None  . Food insecurity - inability: None   . Transportation needs - medical: None  . Transportation needs - non-medical: None  Occupational History    Employer: [employer]  Tobacco Use  . Smoking status: Never Smoker  . Smokeless tobacco: Never Used  Substance and Sexual Activity  . Alcohol use: No    Alcohol/week: 0.0 oz  . Drug use: No  . Sexual activity: No    Birth control/protection: Pill  Other Topics Concern  . None  Social History Narrative   Caffeine  2 sodas daily, 1 cup coffee daily.   Additional Social History:    Pain Medications: please see mar Prescriptions: please see mar Over the Counter: please see mar History of alcohol / drug use?: No history of alcohol / drug abuse Longest period of sobriety (when/how long): NA                    Sleep: Good  Appetite:  Good  Current Medications: Current Facility-Administered Medications  Medication Dose Route Frequency Provider Last Rate Last Dose  . alum & mag hydroxide-simeth (MAALOX/MYLANTA) 200-200-20 MG/5ML suspension 30 mL  30 mL Oral Q4H PRN Rankin, Shuvon B,  NP      . cetirizine (ZYRTEC) tablet 10 mg  10 mg Oral Daily Hampton Abbot, MD   10 mg at 01/04/17 0805  . divalproex (DEPAKOTE ER) 24 hr tablet 1,500 mg  1,500 mg Oral QHS Nwoko, Agnes I, NP   1,500 mg at 01/03/17 2123  . hydrOXYzine (ATARAX/VISTARIL) tablet 50 mg  50 mg Oral TID PRN Rankin, Shuvon B, NP   50 mg at 01/04/17 0805  . ibuprofen (ADVIL,MOTRIN) tablet 600 mg  600 mg Oral Q6H PRN Cobos, Myer Peer, MD   600 mg at 01/04/17 0804  . lamoTRIgine (LAMICTAL) tablet 200 mg  200 mg Oral QHS Rankin, Shuvon B, NP   200 mg at 01/03/17 2123  . lithium carbonate capsule 900 mg  900 mg Oral QHS Artist Beach, MD   900 mg at 01/03/17 2123  . lurasidone (LATUDA) tablet 80 mg  80 mg Oral Q supper Rankin, Shuvon B, NP   80 mg at 01/03/17 1646  . magnesium hydroxide (MILK OF MAGNESIA) suspension 30 mL  30 mL Oral Daily PRN Rankin, Shuvon B, NP      . metoprolol succinate (TOPROL-XL) 24 hr tablet  100 mg  100 mg Oral Daily Rankin, Shuvon B, NP   100 mg at 01/04/17 0804  . ondansetron (ZOFRAN-ODT) disintegrating tablet 4 mg  4 mg Oral Q8H PRN Cobos, Myer Peer, MD   4 mg at 01/03/17 1809  . traZODone (DESYREL) tablet 50 mg  50 mg Oral QHS PRN Rankin, Shuvon B, NP   50 mg at 01/03/17 2123    Lab Results: No results found for this or any previous visit (from the past 48 hour(s)).  Blood Alcohol level:  Lab Results  Component Value Date   ETH <10 12/29/2016   ETH <5 64/33/2951    Metabolic Disorder Labs: Lab Results  Component Value Date   HGBA1C 5.1 12/29/2016   MPG 99.67 12/29/2016   MPG 100 07/19/2016   Lab Results  Component Value Date   PROLACTIN 52.8 (H) 07/19/2016   PROLACTIN  08/21/2009    14.3 (NOTE)     Reference Ranges:                 Female:                       2.1 -  17.1 ng/ml                 Female:   Pregnant          9.7 - 208.5 ng/mL                           Non Pregnant      2.8 -  29.2 ng/mL                           Post  Menopausal   1.8 -  20.3 ng/mL                     Lab Results  Component Value Date   CHOL 198 12/29/2016   TRIG 136 12/29/2016   HDL 53 12/29/2016   CHOLHDL 3.7 12/29/2016   VLDL 27 12/29/2016   LDLCALC 118 (H) 12/29/2016   LDLCALC 100 (H) 07/19/2016    Physical Findings: AIMS: Facial and Oral Movements Muscles of Facial Expression: None, normal Lips and  Perioral Area: None, normal Jaw: None, normal Tongue: None, normal,Extremity Movements Upper (arms, wrists, hands, fingers): None, normal Lower (legs, knees, ankles, toes): None, normal, Trunk Movements Neck, shoulders, hips: None, normal, Overall Severity Severity of abnormal movements (highest score from questions above): None, normal Incapacitation due to abnormal movements: None, normal Patient's awareness of abnormal movements (rate only patient's report): No Awareness, Dental Status Current problems with teeth and/or dentures?: No Does patient usually wear  dentures?: No  CIWA:  CIWA-Ar Total: 1 COWS:  COWS Total Score: 1  Musculoskeletal: Strength & Muscle Tone: within normal limits Gait & Station: normal Patient leans: N/A  Psychiatric Specialty Exam: Physical Exam  Constitutional: She is oriented to person, place, and time. She appears well-developed and well-nourished.  HENT:  Head: Normocephalic and atraumatic.  Respiratory: Effort normal.  Neurological: She is alert and oriented to person, place, and time.  Psychiatric:  As above     ROS  Blood pressure 106/71, pulse 71, temperature 98.7 F (37.1 C), temperature source Oral, resp. rate 16, height 5\' 10"  (1.778 m), weight 124.7 kg (275 lb), last menstrual period 12/23/2016, SpO2 100 %.Body mass index is 39.46 kg/m.  General Appearance: Neatly dressed, pleasant, engaging well and cooperative. Appropriate behavior. Not in any distress. Good relatedness. Not internally stimulated.    Eye Contact:  Good  Speech:  Spontaneous, normal rate tone and volume.   Volume:  Normal  Mood: Euthymic  Affect: Full range and appropriate   Thought Process:  Linear  Orientation:  Full (Time, Place, and Person)  Thought Content:  No delusional theme. No hallucination in any modality. No thoughts of violence.   Suicidal Thoughts:  Getting less  Homicidal Thoughts:  No  Memory:  WNL  Judgement: Good  Insight:  Good  Psychomotor Activity:  Normal   Concentration:  Concentration: Good and Attention Span: Good  Recall:  WNL  Fund of Knowledge:  Fair  Language:  Good  Akathisia:  Negative  Handed:    AIMS (if indicated):     Assets:  Desire for Improvement Financial Resources/Insurance Housing Resilience Vocational/Educational  ADL's:  Intact  Cognition:  WNL  Sleep:  Number of Hours: 6.75     Treatment Plan Summary: Patient is making progress. She is no longer having any suicidal thoughts. She is not psychotic or manic. No dissociation. She is processing discharge. Her mother would  visit her later today. Hopeful discharge tomorrow.   Psychiatric: Bipolar Disorder BLPD  Medical: Migraine  Psychosocial:   PLAN: 1. Continue  medications at current dose 2. Lithium level on Friday 3. Continue to monitor mood, behavior and interaction with peers 4. Feedback from her family when they visit.    Artist Beach, MD 01/04/2017, 2:17 PMPatient ID: [name] [place], female   DOB: 12-25-1970, 46 y.o.   MRN: [id number] Patient ID: IONA STAY, female   DOB: 09/26/1970, 46 y.o.   MRN: [id number]

## 2017-01-04 NOTE — Telephone Encounter (Signed)
I called the patient. She wanted me to know that she was started on lithium.

## 2017-01-04 NOTE — Progress Notes (Signed)
Pt did not attend evening wrap up group, remained in bed.

## 2017-01-04 NOTE — Plan of Care (Signed)
  Coping: Ability to verbalize frustrations and anger appropriately will improve 01/04/2017 1653 - Progressing by Rockne Coons, RN

## 2017-01-04 NOTE — Progress Notes (Signed)
Patient ID: [name], female   DOB: 1970-05-24, 46 y.o.   MRN: [id number]  DAR: Pt. Denies HI and A/V Hallucinations. She endorses passive SI. She is able to contract for safety. She reports that her sleep last night is good, appetite is fair, energy level is low, and concentration is good. She rates her depression level 5-6/10, hopelessness level is 4/10, and anxiety level is 6/10. Patient reports a headache this morning and received PRN medication. She also reports some anxiety this morning and received PRN Vistaril. Support and encouragement provided to the patient. Scheduled medications administered to patient per physician's orders. Patient received a visit from her mother today and she reported that it was an "okay visit." She reports that her mother increased some of her anxiety and she received PRN Vistaril again. She is seen in the milieu and is less isolative as the day goes on. Q15 minute checks are maintained for safety.

## 2017-01-05 LAB — LITHIUM LEVEL: Lithium Lvl: 0.73 mmol/L (ref 0.60–1.20)

## 2017-01-05 MED ORDER — METOPROLOL SUCCINATE ER 100 MG PO TB24: 100.0000 mg | ORAL_TABLET | Freq: Every day | ORAL | 0 refills | Status: AC

## 2017-01-05 MED ORDER — LAMOTRIGINE 200 MG PO TABS: 200.0000 mg | ORAL_TABLET | Freq: Every day | ORAL | 0 refills | Status: DC

## 2017-01-05 MED ORDER — HYDROXYZINE HCL 50 MG PO TABS: 50.0000 mg | ORAL_TABLET | Freq: Three times a day (TID) | ORAL | 0 refills | Status: DC | PRN

## 2017-01-05 MED ORDER — LITHIUM CARBONATE 300 MG PO CAPS: 900.0000 mg | ORAL_CAPSULE | Freq: Every day | ORAL | 0 refills | Status: DC

## 2017-01-05 MED ORDER — TRAZODONE HCL 50 MG PO TABS: 50.0000 mg | ORAL_TABLET | Freq: Every evening | ORAL | 0 refills | Status: DC | PRN

## 2017-01-05 MED ORDER — LURASIDONE HCL 80 MG PO TABS: 80.0000 mg | ORAL_TABLET | Freq: Every day | ORAL | 0 refills | Status: DC

## 2017-01-05 MED ORDER — DIVALPROEX SODIUM ER 500 MG PO TB24: 1500.0000 mg | ORAL_TABLET | Freq: Every day | ORAL | 0 refills | Status: DC

## 2017-01-05 NOTE — Progress Notes (Addendum)
Patient ID: [name], female   DOB: Jul 04, 1970, 46 y.o.   MRN: [id number]  DAR Note: Pt observed in bed with eyes closed. Pt at the time of assessment endorsed moderate anxiety and depression; "I decided to go to my room because nobody came to visit me; I only had someone come visit once since I've been here." Pt denied pain, SI, HI or AVH. Support, encouragement, and safe environment provided.  15-minute safety checks continue. All Pt's questions and concerns addressed. Pt was med compliant. Did not attend wrap-up group.

## 2017-01-05 NOTE — Progress Notes (Signed)
Recreation Therapy Notes  Date: 01/05/17 Time: 0930 Location: 300 Hall Dayroom  Group Topic: Stress Management  Goal Area(s) Addresses:  Patient will verbalize importance of using healthy stress management.  Patient will identify positive emotions associated with healthy stress management.   Intervention: Stress Management  Activity :  LRT introduced the stress management technique of meditation.  LRT read a script to allow patients to focus on being steadfast in different situations.  Education:  Stress Management, Discharge Planning.   Education Outcome: Acknowledges edcuation/In group clarification offered/Needs additional education  Clinical Observations/Feedback: Pt did not attend group.    Victorino Sparrow, LRT/CTRS         Victorino Sparrow A 01/05/2017 10:38 AM

## 2017-01-05 NOTE — BHH Suicide Risk Assessment (Signed)
[facility] Discharge Suicide Risk Assessment   Principal Problem: Bipolar Disorder                                  Borderline PD Discharge Diagnoses:  Patient Active Problem List   Diagnosis Date Noted  . MDD (major depressive disorder), recurrent severe, without psychosis ([place]) [F33.2] 12/28/2016  . Muscle strain [T14.8XXA] 09/19/2016  . Right foot injury, subsequent encounter [S99.921D] 08/25/2016  . Low back pain [M54.5] 06/27/2016  . Left wrist injury, subsequent encounter [S69.92XD] 06/27/2016  . Pain of left thumb [M79.645] 10/07/2015  . 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 Strain of left thumb [IMO0002] 02/11/2015  . Strain of right forearm [S56.911A] 02/11/2015  . Right shoulder pain [M25.511] 12/31/2014  . Injury of right little finger [S69.91XA] 12/31/2014  . Episodic cluster headache, not intractable [G44.019] 12/02/2014  . 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 Hypersomnia, recurrent [G47.13] 12/02/2014  . 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 Lower back injury [S39.92XA] 09/30/2014  . Bipolar I disorder, most recent episode depressed ([place]) [F31.30]   . MDD (major depressive disorder), recurrent, severe, with psychosis ([place]) [F33.3] 09/23/2014  . Injury of left index finger [S69.92XA] 08/20/2014  . Right ankle sprain [S93.401A] 06/01/2014  . Contusion, multiple sites [T07.XXXA] 06/01/2014  . Strain of right gastrocnemius muscle [S86.111A] 06/01/2014  . Phonophobia [F40.298] 05/04/2014  . Photophobia of both eyes [H53.143] 05/04/2014  . Emotionally unstable borderline personality disorder ([place]) [F60.3] 05/04/2014  . Nausea with vomiting [R11.2] 05/04/2014  . Mixed bipolar I disorder ([place]) [F31.60]   . Bipolar I disorder, most recent episode mixed ([place]) [F31.60] 04/18/2014  . Bipolar affective disorder, depressed, mild ([place])  [F31.31] 04/12/2014  . Suicidal ideation [R45.851] 04/12/2014  . Injury of right shoulder and upper arm [S49.91XA] 02/17/2014  . Left leg pain [M79.605] 06/03/2013  . Right hip pain [M25.551] 06/03/2013  . Migraine with status migrainosus [G43.901] 01/08/2013  . Personality disorder ([place]) [F60.9]   . Chronic migraine [G43.709] 05/08/2012  . Contact dermatitis [L25.9] 11/27/2011  . Major depressive disorder, recurrent episode ([place]) [F33.9] 10/27/2011  . Generalized anxiety disorder [F41.1] 10/27/2011  . 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 Borderline personality disorder ([place]) [F60.3] 10/27/2011  . Right foot pain [M79.671] 09/28/2011  . 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 Ganglion cyst [M67.40] 09/29/2009  . 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 BIPOLAR DISORDER UNSPECIFIED [F31.9] 06/09/2008    Total Time spent with patient: 30 minutes  Musculoskeletal: Strength & Muscle Tone: within normal limits Gait & Station: normal Patient leans: N/A  Psychiatric Specialty Exam: ROS  Blood pressure 99/83, pulse 67, temperature 98.2 F (36.8 C), temperature source Oral, resp. rate 16, height 5\' 10"  (1.778 m), weight 124.7 kg (275 lb), last menstrual period 12/23/2016, SpO2 100 %.Body mass index is 39.46 kg/m.  General Appearance: Neatly dressed, pleasant, engaging well and cooperative. Appropriate behavior. Not in any distress. Good relatedness. Not internally stimulated.  Eye Contact::  Good  Speech:  Spontaneous, normal prosody. Normal tone and rate.   Volume:  Normal  Mood:  Euthymic  Affect:  Appropriate and Full Range  Thought Process:  Linear  Orientation:  Full (Time, Place, and Person)  Thought Content:  Future oriented. No delusional  theme. No preoccupation with violent thoughts. No negative ruminations. No obsession.  No hallucination in any modality.    Suicidal Thoughts:  No  Homicidal Thoughts:  No  Memory:  Immediate;   Good Recent;   Good Remote;   Good  Judgement:  Good  Insight:  Good  Psychomotor Activity:  Normal  Concentration:  Good  Recall:  Good  Fund of Knowledge:Good  Language: Good  Akathisia:  Negative  Handed:    AIMS (if indicated):     Assets:  Communication Skills Desire for Improvement Financial Resources/Insurance Housing Physical Health Resilience Transportation Vocational/Educational  Sleep:  Number of Hours: 5.5  Cognition: WNL  ADL's:  Intact   Clinical Assessment::   46 y.o Caucasian female, single, no kids. 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 Background history of Bipolar disorder and Borderline PD. Patient was referred by her therapist. She had expressed thoughts of stabbing herself.  Main stressor was recent sales and demands it created at her work place. Routine labs are essentially normal, toxicology is negative,  UDS negative , no alcohol.  Seen today. Says her mom came to visit yesterday. She spent about twenty minutes with her mom. Says it went well. Patient says she is feeling good. No side effects from her medications. Pre-steady state Lithium is 0.73.  Reports that she is in good spirits. She is not feeling depressed. She reports normal energy and interest. She has been out at the dayroom with peers. Enjoys chatting and watching TV with others. Patient hopes to get back to work next week Wednesday.  She is able to think clearly. She is able to focus on task. Her thoughts are not crowded or racing. No evidence of mania. No hallucination in any modality. She is not making any delusional statement. No passivity of will/thought. She is fully in touch with reality. No thoughts of suicide. No thoughts of homicide. No violent thoughts. No overwhelming anxiety. No access to weapons. No new stressor.   Nursing staff reports that patient has been appropriate on the unit. Patient has been  interacting well with peers. No behavioral issues. Patient has not voiced any suicidal thoughts. Patient has not been observed to be internally stimulated. Patient has been adherent with treatment recommendations. Patient has been tolerating their medication well.   Patient was discussed at team. Team members feels that patient is back to her baseline level of function. Team agrees with plan to discharge patient today.    Demographic Factors:  NA  Loss Factors: NA  Historical Factors: Impulsivity  Risk Reduction Factors:   Sense of responsibility to family, Employed, Living with another person, especially a relative, Positive social support, Positive therapeutic relationship and Positive coping skills or problem solving skills  Continued Clinical Symptoms:  As above  Cognitive Features That Contribute To Risk:  None    Suicide Risk:  Minimal: No identifiable suicidal ideation.  Patient is not having any thoughts of suicide at this time. Modifiable risk factors targeted during this admission includes depression and mood swings. Demographical and historical risk factors cannot be modified. Patient is now engaging well. Patient is reliable and is future oriented. We have buffered patient's support structures. At this point, patient is at low risk of suicide. Patient is aware of the effects of psychoactive substances on decision making process. Patient has been provided with emergency contacts. Patient acknowledges to use resources provided if unforseen circumstances changes their current risk stratification.    Follow-up Information    [facility]  Follow up on 01/12/2017.   Why:  Therapy with Ruben Reason on Friday at 10:00AM. Thank you. Contact information: ATTN: Ruben Reason [place], [place] [address] Phone: [phone] Fax: x       [facility] Follow up on 01/08/2017.   Why:  Medication management with Pauline Good  NP on this date at 3:00PM. Thank you.    Contact information: ATTN: Pauline Good NP [address] [place], [place] [address] Phone: [phone] Fax: [facility] Follow up on 01/10/2017.   Specialty:  Behavioral Health Why:  Assessment for Mental Health IOP with Dellia Nims on Wed 01/10/17 at 8:30AM. You will likely begin IOP on this day as well. Thank you.  Contact information: [place] [phone]          Plan Of Care/Follow-up recommendations:  1. Continue current psychotropic medications 2. Mental health and addiction follow up as arranged.  3. Discharge in care of her family 4. Provided limited quantity of prescriptions   Artist Beach, MD 01/05/2017, 10:11 AM

## 2017-01-05 NOTE — Progress Notes (Signed)
D pt is seen sitting in the dayroom. She is anxious upon approach by this Probation officer. A She says " I think I'm going today...". She completes her daily assessment and on this she wrote she has experienced SI today. She says this SI is something she deals with "all the time and I'm not going to hurt myself..ibuprofen know how to call for help". Writer completed [employer]. Pt stated verbal understanding . Pt requested return to work letter from [employer] and pt left at adult uit, awaiting SW to assist with letter for her employer. BN will give ptcc of paperwork that writer has reviewed with her.

## 2017-01-05 NOTE — Progress Notes (Signed)
  [facility] Adult Case Management Discharge Plan :  Will you be returning to the same living situation after discharge:  Yes,  home with mother At discharge, do you have transportation home?: Yes,  mother Do you have the ability to pay for your medications: Yes,  [employer]  Release of information consent forms completed and submitted to medical records by CSW.  Patient to Follow up at: Follow-up Information    [facility] Follow up on 01/12/2017.   Why:  Therapy with Ruben Reason on Friday at 10:00AM. Thank you. Contact information: ATTN: Ruben Reason [place], [place] [address] Phone: [phone] Fax: x       [facility] Follow up on 01/08/2017.   Why:  Medication management with Pauline Good NP on this date at 3:00PM. Thank you.    Contact information: ATTN: Pauline Good NP [address] [place], [place] [address] Phone: [phone] Fax: [facility] Follow up on 01/10/2017.   Specialty:  Behavioral Health Why:  Assessment for Mental Health IOP with Dellia Nims on Wed 01/10/17 at 8:30AM. You will likely begin IOP on this day as well. Thank you.  Contact information: [place] [phone]          Next level of care provider has access to [place] and Suicide Prevention discussed: Yes,  SPE completed with pt's mother; SPI pamphlet and Mobile Crisis information provided to pt .  Have you used any form of tobacco in the last 30 days? (Cigarettes, Smokeless Tobacco, Cigars, and/or Pipes): No  Has patient been referred to the Quitline?: N/A patient is not a smoker  Patient has been referred for addiction treatment: [place], LCSW 01/05/2017, 9:02 AM

## 2017-01-05 NOTE — Discharge Summary (Signed)
Physician Discharge Summary Note  Patient:  [name] is an 46 y.o., female MRN:  [id number] DOB:  [birth year] Patient phone:  [phone] (home)  Patient address:   [place] [address],  Total Time spent with patient: 20 minutes  Date of Admission:  12/28/2016 Date of Discharge: 01/05/17   Reason for Admission:  Worsening depression with SI  Principal Problem: MDD (major depressive disorder), recurrent severe, without psychosis [facility]) Discharge Diagnoses: Patient Active Problem List   Diagnosis Date Noted  . MDD (major depressive disorder), recurrent severe, without psychosis ([place]) [F33.2] 12/28/2016  . Muscle strain [T14.8XXA] 09/19/2016  . Right foot injury, subsequent encounter [S99.921D] 08/25/2016  . Low back pain [M54.5] 06/27/2016  . Left wrist injury, subsequent encounter [S69.92XD] 06/27/2016  . Pain of left thumb [M79.645] 10/07/2015  . 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 Strain of left thumb [IMO0002] 02/11/2015  . Strain of right forearm [S56.911A] 02/11/2015  . Right shoulder pain [M25.511] 12/31/2014  . Injury of right little finger [S69.91XA] 12/31/2014  . Episodic cluster headache, not intractable [G44.019] 12/02/2014  . 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 Hypersomnia, recurrent [G47.13] 12/02/2014  . 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 Lower back injury [S39.92XA] 09/30/2014  . Bipolar I disorder, most recent episode depressed ([place]) [F31.30]   . MDD (major depressive disorder), recurrent, severe, with psychosis ([place]) [F33.3] 09/23/2014  . Injury of left index finger [S69.92XA] 08/20/2014  . Right ankle sprain [S93.401A] 06/01/2014  . Contusion, multiple sites [T07.XXXA] 06/01/2014  . Strain of right gastrocnemius muscle [S86.111A] 06/01/2014  . Phonophobia [F40.298] 05/04/2014  .  Photophobia of both eyes [H53.143] 05/04/2014  . Emotionally unstable borderline personality disorder ([place]) [F60.3] 05/04/2014  . Nausea with vomiting [R11.2] 05/04/2014  . Mixed bipolar I disorder ([place]) [F31.60]   . Bipolar I disorder, most recent episode mixed ([place]) [F31.60] 04/18/2014  . Bipolar affective disorder, depressed, mild ([place]) [F31.31] 04/12/2014  . Suicidal ideation [R45.851] 04/12/2014  . Injury of right shoulder and upper arm [S49.91XA] 02/17/2014  . Left leg pain [M79.605] 06/03/2013  . Right hip pain [M25.551] 06/03/2013  . Migraine with status migrainosus [G43.901] 01/08/2013  . Personality disorder ([place]) [F60.9]   . Chronic migraine [G43.709] 05/08/2012  . Contact dermatitis [L25.9] 11/27/2011  . Major depressive disorder, recurrent episode ([place]) [F33.9] 10/27/2011  . Generalized anxiety disorder [F41.1] 10/27/2011  . 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 Borderline personality disorder ([place]) [F60.3] 10/27/2011  . Right foot pain [M79.671] 09/28/2011  . 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 Ganglion cyst [M67.40] 09/29/2009  . 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 BIPOLAR DISORDER UNSPECIFIED [F31.9] 06/09/2008    Past Psychiatric History: history of multiple  prior psychiatric admissions. In the past she has been diagnosed with Bipolar Disorder and Borderline Personality Disorder in the past .Reports history of depression and history of brief episodes of increased irritability, spending , energy level.  History of suicide attempt by walking in front of a car in 2010.No history of psychosis  Past Medical History:  Past Medical History:  Diagnosis Date  . Achilles tendinitis   . Achilles tendinitis   . ADHD (attention deficit hyperactivity disorder)   . Allergy   . Arthritis   . Bipolar affective ([place])   . Bipolar disorder ([place])   .  Cataracts,  bilateral   . Eczema   . Ganglion cyst 09/29/2009   left wrist (2 cyst)  . Hyperprolactinemia ([place])   . Hypertension   . Lipoma   . Migraine   . Personality disorder ([place])   . Pes planus     Past Surgical History:  Procedure Laterality Date  . ANKLE SURGERY  12/88   left   . chest nodule  1990?   rt chest wall nodule removal  . GANGLION CYST EXCISION  2011  . lipoma removal    . right bunioectomy    . SHOULDER SURGERY  01/13/2011   right, partial tear  . tumor resection left thigh     Family History:  Family History  Problem Relation Age of Onset  . Hypertension Mother   . Hyperlipidemia Mother   . Heart attack Father   . Heart disease Father   . Hypertension Father   . Bipolar disorder Father   . Diabetes Paternal Grandfather   . Heart disease Maternal Aunt   . Breast cancer Maternal Aunt   . Heart disease Maternal Grandmother   . Cancer Maternal Grandmother        colon   Family Psychiatric  History: parents are alive, father is in a [place], has history of Bipolar Disorder, no suicides in family. No history of substance abuse in family .  Social History:  Social History   Substance and Sexual Activity  Alcohol Use No  . Alcohol/week: 0.0 oz     Social History   Substance and Sexual Activity  Drug Use No    Social History   Socioeconomic History  . Marital status: Single    Spouse name: None  . Number of children: 0  . Years of education: None  . Highest education level: None  Social Needs  . Financial resource strain: None  . Food insecurity - worry: None  . Food insecurity - inability: None  . Transportation needs - medical: None  . Transportation needs - non-medical: None  Occupational History    Employer: [employer]  Tobacco Use  . Smoking status: Never Smoker  . Smokeless tobacco: Never Used  Substance and Sexual Activity  . Alcohol use: No    Alcohol/week: 0.0 oz  . Drug use: No  . Sexual activity: No    Birth control/protection:  Pill  Other Topics Concern  . None  Social History Narrative   Caffeine  2 sodas daily, 1 cup coffee daily.    Hospital Course:  12/29/16 [facility] MD Assessment: 46 year old female, known to [place] from prior admissions . She has been diagnosed with Bipolar Disorder, and has had prior psychiatric admissions, most recently in June 2018. At the time she was admitted for depression, suicidal ideations. She was discharged on Lamictal, Latuda, Depakote, Zoloft, Trazodone. States she has been taking medications regularly. She states " I was doing OK, but over the last few weeks I just started getting depressed again". She has been isolating, less sociable than normal, and has developed suicidal ideations of stabbing self . In addition to depression, describes increased anxiety, states " I feel so anxious I start shaking". She has had to leave work early on a few occasions due to her depression and anxiety. She endorses neuro-vegetative symptoms as below.  Patient remained on the [facility] unit for 8 days and stabilized with medication and therapy. Patient was continued on Depakote and titrated to 1500 mg QHS, continued on Lamictal 200 mg Daily, continued  on Latuda 80 mg Daily, and started on Lithium and titrated to 900 mg QHS. Patient showed improvement with improved mood, affect, sleep, appetite, and interaction. Patient has been seen in the day room interacting with staff and peers. Patient has been attending groups and participating. Patient will be discharged home with her mother. Patient agrees to follow up at [facility]. She denies any SI/HI/AVH and contracts for safety. She is provided with prescriptions for her medications upon discharge.     Physical Findings: AIMS: Facial and Oral Movements Muscles of Facial Expression: None, normal Lips and Perioral Area: None, normal Jaw: None, normal Tongue: None, normal,Extremity Movements Upper (arms, wrists, hands, fingers): None,  normal Lower (legs, knees, ankles, toes): None, normal, Trunk Movements Neck, shoulders, hips: None, normal, Overall Severity Severity of abnormal movements (highest score from questions above): None, normal Incapacitation due to abnormal movements: None, normal Patient's awareness of abnormal movements (rate only patient's report): No Awareness, Dental Status Current problems with teeth and/or dentures?: No Does patient usually wear dentures?: No  CIWA:  CIWA-Ar Total: 1 COWS:  COWS Total Score: 1  Musculoskeletal: Strength & Muscle Tone: within normal limits Gait & Station: normal Patient leans: N/A  Psychiatric Specialty Exam: Physical Exam  Nursing note and vitals reviewed. Constitutional: She is oriented to person, place, and time. She appears well-developed and well-nourished.  Cardiovascular: Normal rate.  Respiratory: Effort normal.  Musculoskeletal: Normal range of motion.  Neurological: She is alert and oriented to person, place, and time.  Skin: Skin is warm.    Review of Systems  Constitutional: Negative.   HENT: Negative.   Eyes: Negative.   Respiratory: Negative.   Cardiovascular: Negative.   Gastrointestinal: Negative.   Genitourinary: Negative.   Musculoskeletal: Negative.   Skin: Negative.   Neurological: Negative.   Endo/Heme/Allergies: Negative.   Psychiatric/Behavioral: Negative.     Blood pressure 99/83, pulse 67, temperature 98.2 F (36.8 C), temperature source Oral, resp. rate 16, height 5\' 10"  (1.778 m), weight 124.7 kg (275 lb), last menstrual period 12/23/2016, SpO2 100 %.Body mass index is 39.46 kg/m.  General Appearance: Casual  Eye Contact:  Good  Speech:  Clear and Coherent and Normal Rate  Volume:  Normal  Mood:  Euthymic  Affect:  Appropriate  Thought Process:  Goal Directed and Descriptions of Associations: Intact  Orientation:  Full (Time, Place, and Person)  Thought Content:  WDL  Suicidal Thoughts:  No  Homicidal Thoughts:  No   Memory:  Immediate;   Good Recent;   Good Remote;   Good  Judgement:  Good  Insight:  Good  Psychomotor Activity:  Normal  Concentration:  Concentration: Good and Attention Span: Good  Recall:  Good  Fund of Knowledge:  Good  Language:  Good  Akathisia:  No  Handed:  Right  AIMS (if indicated):     Assets:  Communication Skills Desire for Improvement Financial Resources/Insurance Housing Social Support Transportation  ADL's:  Intact  Cognition:  WNL  Sleep:  Number of Hours: 5.5     Have you used any form of tobacco in the last 30 days? (Cigarettes, Smokeless Tobacco, Cigars, and/or Pipes): No  Has this patient used any form of tobacco in the last 30 days? (Cigarettes, Smokeless Tobacco, Cigars, and/or Pipes) Yes, No  Blood Alcohol level:  Lab Results  Component Value Date   [facility] <10 12/29/2016   ETH <5 41/28/7867    Metabolic Disorder Labs:  Lab Results  Component Value Date  HGBA1C 5.1 12/29/2016   MPG 99.67 12/29/2016   MPG 100 07/19/2016   Lab Results  Component Value Date   PROLACTIN 52.8 (H) 07/19/2016   PROLACTIN  08/21/2009    14.3 (NOTE)     Reference Ranges:                 Female:                       2.1 -  17.1 ng/ml                 Female:   Pregnant          9.7 - 208.5 ng/mL                           Non Pregnant      2.8 -  29.2 ng/mL                           Post  Menopausal   1.8 -  20.3 ng/mL                     Lab Results  Component Value Date   CHOL 198 12/29/2016   TRIG 136 12/29/2016   HDL 53 12/29/2016   CHOLHDL 3.7 12/29/2016   VLDL 27 12/29/2016   LDLCALC 118 (H) 12/29/2016   LDLCALC 100 (H) 07/19/2016    See Psychiatric Specialty Exam and Suicide Risk Assessment completed by Attending Physician prior to discharge.  Discharge destination:  Home  Is patient on multiple antipsychotic therapies at discharge:  No   Has Patient had three or more failed trials of antipsychotic monotherapy by history:  No  Recommended Plan for  Multiple Antipsychotic Therapies: NA   Allergies as of 01/05/2017      Reactions   Adhesive [tape] Itching, Rash   Also reacted to Steri Strips and Band-Aids.   Dilaudid [hydromorphone Hcl] Itching   Morphine Nausea And Vomiting   Penicillins Hives   Has patient had a PCN reaction causing immediate rash, facial/tongue/throat swelling, SOB or lightheadedness with hypotension: YES Has patient had a PCN reaction causing severe rash involving mucus membranes or skin necrosis: NO Has patient had a PCN reaction that required hospitalization NO Has patient had a PCN reaction occurring within the last 10 years:NO If all of the above answers are "NO", then may proceed with Cephalosporin use.   Percocet [oxycodone-acetaminophen] Itching   Prednisone Hives   Provera [medroxyprogesterone Acetate] Other (See Comments)   Causes manic episodes   Ultram [tramadol Hcl] Itching      Medication List    STOP taking these medications   diclofenac 75 MG EC tablet Commonly known as:  VOLTAREN   levocetirizine 5 MG tablet Commonly known as:  XYZAL   sertraline 50 MG tablet Commonly known as:  ZOLOFT   SUMAtriptan 6 MG/0.5ML Soaj     TAKE these medications     Indication  divalproex 500 MG 24 hr tablet Commonly known as:  DEPAKOTE ER Take 3 tablets (1,500 mg total) by mouth at bedtime. For mood control What changed:  additional instructions  Indication:  mood stability   hydrOXYzine 50 MG tablet Commonly known as:  ATARAX/VISTARIL Take 1 tablet (50 mg total) by mouth 3 (three) times daily as needed for anxiety.  Indication:  Anxiety Neurosis (Inactive)   lamoTRIgine 200 MG tablet  Commonly known as:  LAMICTAL Take 1 tablet (200 mg total) by mouth at bedtime. For mood stability What changed:  additional instructions  Indication:  Manic-Depression   lithium carbonate 300 MG capsule Take 3 capsules (900 mg total) by mouth at bedtime. For mood control  Indication:  mood stability    lurasidone 80 MG Tabs tablet Commonly known as:  LATUDA Take 1 tablet (80 mg total) by mouth daily with supper. For mood control What changed:  additional instructions  Indication:  mood stabilization   metoprolol succinate 100 MG 24 hr tablet Commonly known as:  TOPROL-XL Take 1 tablet (100 mg total) by mouth daily. Take with or immediately following a meal. Start taking on:  01/06/2017  Indication:  High Blood Pressure Disorder   traZODone 50 MG tablet Commonly known as:  DESYREL Take 1 tablet (50 mg total) by mouth at bedtime as needed for sleep.  Indication:  Trouble Sleeping      Follow-up Information    [facility] Follow up on 01/12/2017.   Why:  Therapy with Ruben Reason on Friday at 10:00AM. Thank you. Contact information: ATTN: Ruben Reason [place], [place] [address] Phone: [phone] Fax: x       [facility] Follow up on 01/08/2017.   Why:  Medication management with Pauline Good NP on this date at 3:00PM. Thank you.    Contact information: ATTN: Pauline Good NP [address] [place], [place] [address] Phone: [phone] Fax: [facility] Follow up on 01/10/2017.   Specialty:  Behavioral Health Why:  Assessment for Mental Health IOP with Dellia Nims on Wed 01/10/17 at 8:30AM. You will likely begin IOP on this day as well. Thank you.  Contact information: [place] [phone]          Follow-up recommendations:  Continue activity as tolerated. Continue diet as recommended by your PCP. Ensure to keep all appointments with outpatient providers.  Comments:  Patient is instructed prior to discharge to: Take all medications as prescribed by his/her mental healthcare provider. Report any adverse effects and or reactions from the medicines to his/her outpatient provider  promptly. Patient has been instructed & cautioned: To not engage in alcohol and or illegal drug use while on prescription medicines. In the event of worsening symptoms, patient is instructed to call the crisis hotline, 911 and or go to the nearest ED for appropriate evaluation and treatment of symptoms. To follow-up with his/her primary care provider for your other medical issues, concerns and or health care needs.    Signed: [place], FNP 01/05/2017, 11:24 AM

## 2017-01-09 ENCOUNTER — Ambulatory Visit: Payer: PPO | Attending: Family Medicine | Admitting: Occupational Therapy

## 2017-01-15 ENCOUNTER — Ambulatory Visit: Payer: PPO | Admitting: Family Medicine

## 2017-01-18 ENCOUNTER — Other Ambulatory Visit: Payer: Self-pay | Admitting: Family Medicine

## 2017-01-22 ENCOUNTER — Other Ambulatory Visit: Payer: Self-pay | Admitting: Neurology

## 2017-02-01 NOTE — Addendum Note (Signed)
Addended by: Sherrie George F on: 02/01/2017 10:44 AM   Modules accepted: Orders

## 2017-02-03 ENCOUNTER — Ambulatory Visit (HOSPITAL_BASED_OUTPATIENT_CLINIC_OR_DEPARTMENT_OTHER)
Admission: RE | Admit: 2017-02-03 | Discharge: 2017-02-03 | Disposition: A | Discharge status: Home / Self Care | Payer: PPO | Source: Ambulatory Visit | Attending: Family Medicine | Admitting: Family Medicine

## 2017-02-03 DIAGNOSIS — M71571 Other bursitis, not elsewhere classified, right ankle and foot: Secondary | ICD-10-CM | POA: Insufficient documentation

## 2017-02-03 DIAGNOSIS — Z9889 Other specified postprocedural states: Secondary | ICD-10-CM | POA: Diagnosis not present

## 2017-02-03 DIAGNOSIS — M79671 Pain in right foot: Secondary | ICD-10-CM | POA: Insufficient documentation

## 2017-02-03 DIAGNOSIS — M19071 Primary osteoarthritis, right ankle and foot: Secondary | ICD-10-CM | POA: Insufficient documentation

## 2017-02-05 ENCOUNTER — Inpatient Hospital Stay (HOSPITAL_COMMUNITY)
Admission: RE | Admit: 2017-02-05 | Disposition: A | Discharge status: Home / Self Care | Payer: PPO | Source: Home / Self Care | Attending: Psychiatry | Admitting: Psychiatry

## 2017-02-05 ENCOUNTER — Ambulatory Visit (HOSPITAL_COMMUNITY)
Admission: RE | Admit: 2017-02-05 | Discharge: 2017-02-05 | Disposition: A | Discharge status: Home / Self Care | Payer: PPO | Attending: Psychiatry | Admitting: Psychiatry

## 2017-02-05 DIAGNOSIS — F319 Bipolar disorder, unspecified: Secondary | ICD-10-CM | POA: Insufficient documentation

## 2017-02-05 NOTE — H&P (Signed)
Behavioral Health Medical Screening Exam  [name] is an 47 y.o. female who presents as a walk in due to probable side effects to lithium. Was inpatient at [facility] last month when lithium was initiated. Per most recent discharge summary was discharged 01/05/2017 and lithium had been titrated to 900 mg at bedtime. She is currently following up with [name]. Patients states "I feel nervous on the inside. My hand is shaking." Her last lithium level was 0.73 on 01/05/2017. [name] also states "I got frustrated because I could not get in touch with my outpatient provider. I'm not suicidal and I really don't want to come in again for a medication side effect. Yes they have an after hour line but I thought it was only for emergency. I think I would feel fine calling them this evening and asking if I can decrease the dose to see if that helps me. I take three 300 mg of the lithium carbonate. I don't think that I need inpatient." During the assessment [name] denied any significant suicidal or homicidal ideation. She was motivated to remain on lithium therapy but possibly adjust the dose with guidance from her outpatient provider.   Total Time spent with patient: 20 minutes  Psychiatric Specialty Exam: Physical Exam  Constitutional: She is oriented to person, place, and time. She appears well-developed and well-nourished.  HENT:  Head: Normocephalic and atraumatic.  Neck: Normal range of motion.  Cardiovascular: Normal rate, regular rhythm, normal heart sounds and intact distal pulses.  Respiratory: Effort normal and breath sounds normal.  GI: Soft. Bowel sounds are normal.  Musculoskeletal: Normal range of motion.  Neurological: She is alert and oriented to person, place, and time.  Skin: Skin is warm.    ROS  Blood pressure 131/76, pulse 60, temperature 98.1 F (36.7 C), resp. rate 16, SpO2 100 %.There is no height or weight on file to calculate BMI.  General  Appearance: Casual  Eye Contact:  Good  Speech:  Clear and Coherent  Volume:  Normal  Mood:  Anxious  Affect:  Congruent  Thought Process:  Coherent  Orientation:  Full (Time, Place, and Person)  Thought Content:  Side effects of lithium  Suicidal Thoughts:  No  Homicidal Thoughts:  No  Memory:  Immediate;   Good Recent;   Good Remote;   Good  Judgement:  Fair  Insight:  Present  Psychomotor Activity:  Tremor Mild of her hands   Concentration: Concentration: Good and Attention Span: Good  Recall:  Good  Fund of Knowledge:Good  Language: Good  Akathisia:  No  Handed:  Right  AIMS (if indicated):     Assets:  Communication Skills Desire for Improvement Financial Resources/Insurance Housing Intimacy Leisure Time Physical Health Resilience Social Support Talents/Skills  Sleep:       Musculoskeletal: Strength & Muscle Tone: within normal limits Gait & Station: normal Patient leans: N/A  Blood pressure 131/76, pulse 60, temperature 98.1 F (36.7 C), resp. rate 16, SpO2 100 %.  Recommendations:  Based on my evaluation the patient does not appear to have an emergency medical condition.  Elmarie Shiley, NP 02/05/2017, 5:51 PM
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
## 2017-02-16 ENCOUNTER — Ambulatory Visit: Payer: PPO | Admitting: Family Medicine

## 2017-02-16 ENCOUNTER — Encounter: Payer: Self-pay | Admitting: Family Medicine

## 2017-02-16 DIAGNOSIS — M25512 Pain in left shoulder: Secondary | ICD-10-CM | POA: Diagnosis not present

## 2017-02-16 MED ORDER — IBUPROFEN 600 MG PO TABS: 600.0000 mg | ORAL_TABLET | Freq: Three times a day (TID) | ORAL | 1 refills | Status: DC | PRN

## 2017-02-16 NOTE — Patient Instructions (Signed)
You have strained your rotator cuff. Try to avoid painful activities (overhead activities, lifting with extended arm) as much as possible. Ibuprofen up to 3 times a day with food for pain and inflammation. Can take tylenol in addition to this. Start physical therapy with transition to home exercise program. Do home exercise program with theraband and scapular stabilization exercises daily 3 sets of 10 once a day. If not improving at follow-up we will consider further imaging, injection, physical therapy, and/or nitro patches. Follow up with me in 1 month.

## 2017-02-18 ENCOUNTER — Encounter: Payer: Self-pay | Admitting: Family Medicine

## 2017-02-18 DIAGNOSIS — M25512 Pain in left shoulder: Secondary | ICD-10-CM | POA: Insufficient documentation

## 2017-02-18 NOTE — Assessment & Plan Note (Signed)
2/2 rotator cuff strain.  Ibuprofen tid.  Start physical therapy and home exercises.  F/u in 1 month.  Consider imaging, injection, nitro patches if not improving.

## 2017-02-18 NOTE — Progress Notes (Signed)
PCP: Aretta Nip, MD  Subjective:   HPI: Patient is a 47 y.o. female here for left shoulder pain.  Patient reports about a week ago she started to get pain anterior to posterior left shoulder. 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 Pain sharp, worse with overhead motions. No skin changes, numbness.  Past Medical History:  Diagnosis Date  . Achilles tendinitis   . Achilles tendinitis   . ADHD (attention deficit hyperactivity disorder)   . Allergy   . Arthritis   . Bipolar affective ([place])   . Bipolar disorder ([place])   . Cataracts, bilateral   . Eczema   . Ganglion cyst 09/29/2009   left wrist (2 cyst)  . Hyperprolactinemia ([place])   . Hypertension   . Lipoma   . Migraine   . Personality disorder ([place])   . Pes planus     Current Outpatient Medications on File Prior to Visit  Medication Sig Dispense Refill  . divalproex (DEPAKOTE ER) 500 MG 24 hr tablet Take 3 tablets (1,500 mg total) by mouth at bedtime. For mood control 90 tablet 0  . hydrOXYzine (ATARAX/VISTARIL) 50 MG tablet Take 1 tablet (50 mg total) by mouth 3 (three) times daily as needed for anxiety. 30 tablet 0  . lamoTRIgine (LAMICTAL) 200 MG tablet Take 1 tablet (200 mg total) by mouth at bedtime. For mood stability 30 tablet 0  . lurasidone (LATUDA) 80 MG TABS tablet Take 1 tablet (80 mg total) by mouth daily with supper. For mood control 30 tablet 0  . metoprolol succinate (TOPROL-XL) 100 MG 24 hr tablet Take 1 tablet (100 mg total) by mouth daily. Take with or immediately following a meal. 30 tablet 0  . traZODone (DESYREL) 50 MG tablet Take 1 tablet (50 mg total) by mouth at bedtime as needed for sleep. 30 tablet 0   No current facility-administered medications on file prior to visit.     Past Surgical History:  Procedure Laterality Date  . ANKLE SURGERY  12/88   left   . chest nodule  1990?   rt chest  wall nodule removal  . GANGLION CYST EXCISION  2011  . lipoma removal    . right bunioectomy    . SHOULDER SURGERY  01/13/2011   right, partial tear  . tumor resection left thigh      Allergies  Allergen Reactions  . Adhesive [Tape] Itching and Rash    Also reacted to Steri Strips and Band-Aids.  . Dilaudid [Hydromorphone Hcl] Itching  . Morphine Nausea And Vomiting  . Penicillins Hives    Has patient had a PCN reaction causing immediate rash, facial/tongue/throat swelling, SOB or lightheadedness with hypotension: YES Has patient had a PCN reaction causing severe rash involving mucus membranes or skin necrosis: NO Has patient had a PCN reaction that required hospitalization NO Has patient had a PCN reaction occurring within the last 10 years:NO If all of the above answers are "NO", then may proceed with Cephalosporin use.  Marland Kitchen Percocet [Oxycodone-Acetaminophen] Itching  . Prednisone Hives  . Provera [Medroxyprogesterone Acetate] Other (See Comments)    Causes manic episodes  . Ultram [Tramadol Hcl] Itching    Social History   Socioeconomic History  . Marital status: Single    Spouse name: Not on file  . Number of children: 0  . Years of education: Not on file  . Highest education  level: Not on file  Social Needs  . Financial resource strain: Not on file  . Food insecurity - worry: Not on file  . Food insecurity - inability: Not on file  . Transportation needs - medical: Not on file  . Transportation needs - non-medical: Not on file  Occupational History    Employer: [employer]  Tobacco Use  . Smoking status: Never Smoker  . Smokeless tobacco: Never Used  Substance and Sexual Activity  . Alcohol use: No    Alcohol/week: 0.0 oz  . Drug use: No  . Sexual activity: No    Birth control/protection: Pill  Other Topics Concern  . Not on file  Social History Narrative   Caffeine  2 sodas daily, 1 cup coffee daily.    Family History  Problem Relation Age of Onset  .  Hypertension Mother   . Hyperlipidemia Mother   . Heart attack Father   . Heart disease Father   . Hypertension Father   . Bipolar disorder Father   . Diabetes Paternal Grandfather   . Heart disease Maternal Aunt   . Breast cancer Maternal Aunt   . Heart disease Maternal Grandmother   . Cancer Maternal Grandmother        colon    BP 110/73   Ht 5\' 10"  (1.778 m)   Wt 270 lb (122.5 kg)   BMI 38.74 kg/m   Review of Systems: See HPI above.     Objective:  Physical Exam:  Gen: NAD, comfortable in exam room  Left shoulder: No swelling, ecchymoses.  No gross deformity. No TTP. FROM with painful arc. Positive Hawkins, Neers. Negative Yergasons. Strength 5/5 with empty can and resisted internal/external rotation.  Pain with empty can. Negative apprehension. NV intact distally.  Right shoulder: No swelling, ecchymoses.  No gross deformity. No TTP. FROM. Strength 5/5 with empty can and resisted internal/external rotation. NV intact distally.   Assessment & Plan:  1. Left shoulder pain - 2/2 rotator cuff strain.  Ibuprofen tid.  Start physical therapy and home exercises.  F/u in 1 month.  Consider imaging, injection, nitro patches if not improving.

## 2017-02-19 NOTE — Addendum Note (Signed)
Addended by: Sherrie George F on: 02/19/2017 11:59 AM   Modules accepted: Orders

## 2017-03-16 DIAGNOSIS — R109 Unspecified abdominal pain: Secondary | ICD-10-CM | POA: Diagnosis not present

## 2017-03-19 ENCOUNTER — Ambulatory Visit: Payer: Self-pay | Admitting: Family Medicine

## 2017-03-28 DIAGNOSIS — H43813 Vitreous degeneration, bilateral: Secondary | ICD-10-CM | POA: Diagnosis not present

## 2017-04-11 ENCOUNTER — Encounter: Payer: Self-pay | Admitting: Occupational Therapy

## 2017-04-11 DIAGNOSIS — J0100 Acute maxillary sinusitis, unspecified: Secondary | ICD-10-CM | POA: Diagnosis not present

## 2017-04-11 DIAGNOSIS — R05 Cough: Secondary | ICD-10-CM | POA: Diagnosis not present

## 2017-04-11 DIAGNOSIS — J0110 Acute frontal sinusitis, unspecified: Secondary | ICD-10-CM | POA: Diagnosis not present

## 2017-04-11 DIAGNOSIS — H938X3 Other specified disorders of ear, bilateral: Secondary | ICD-10-CM | POA: Diagnosis not present

## 2017-04-11 NOTE — Therapy (Signed)
[facility] [address] [place], [place], [address] Phone: [phone]   Fax:  [phone]  Patient Details  Name: [name] MRN: [id number] Date of Birth: [birth year] Referring Provider:  No ref. provider found  Encounter Date: 04/11/2017 OCCUPATIONAL THERAPY DISCHARGE SUMMARY   Current functional level related to goals / functional outcomes: Pt goals were not fully assessed as pt was hospitalized at [facility] and she did not return afterwards.   Remaining deficits: See last progress note.   Education / Equipment: Pt was educated regarding HEP. Pt verbalized understanding.  Plan: Patient agrees to discharge.  Patient goals were not met. Patient is being discharged due to not returning since the last visit.  ?????      RINE,KATHRYN 04/11/2017, 12:42 PM  [facility] [address] [place], [place], [address] Phone: [phone]   Fax:  [phone]

## 2017-04-13 ENCOUNTER — Ambulatory Visit: Payer: Self-pay | Admitting: Family Medicine

## 2017-04-19 ENCOUNTER — Ambulatory Visit: Payer: PPO | Admitting: Family Medicine

## 2017-04-19 ENCOUNTER — Encounter: Payer: Self-pay | Admitting: Family Medicine

## 2017-04-19 DIAGNOSIS — M25551 Pain in right hip: Secondary | ICD-10-CM

## 2017-04-19 MED ORDER — METHYLPREDNISOLONE ACETATE 40 MG/ML IJ SUSP: 40.0000 mg | Freq: Once | INTRAMUSCULAR | Status: DC

## 2017-04-19 MED ORDER — METHYLPREDNISOLONE ACETATE 40 MG/ML IJ SUSP
40.0000 mg | Freq: Once | INTRAMUSCULAR | Status: AC
  Administered 2017-04-19: 40 mg via INTRA_ARTICULAR

## 2017-04-19 NOTE — Patient Instructions (Signed)
You have IT band syndrome and trochanteric bursitis. Avoid painful activities as much as possible. Ice over area of pain 3-4 times a day for 15 minutes at a time Standing hip rotations and hip side raise exercise 3 sets of 10 once a day - add weights if this becomes too easy. Stretches - pick 2-3 and hold for 20-30 seconds x 3 - do once or twice a day. You were given a cortisone injection today. Tylenol and/or aleve as needed for pain. Consider physical therapy if not improving as expected. Follow up with me in 6 weeks.

## 2017-04-19 NOTE — Progress Notes (Deleted)
depo 

## 2017-04-19 NOTE — Progress Notes (Deleted)
   Subjective   Patient ID: [name]    DOB: 03/28/1970, 47 y.o. female   MRN: [id number]  CC: "***"  HPI: [name] is a 46 y.o. female who presents to clinic today for the following:  ***: ***  ROS: see HPI for pertinent.  [place]: Bipolar, borderline personality disorder, migraines. Surgical history right shoulder, left thigh tumor resection, bunionectomy, left ankle. Family history HTN, HLD, bipolar. Smoking status reviewed. Medications reviewed.  Objective   There were no vitals taken for this visit. Vitals and nursing note reviewed.  General: well nourished, well developed, NAD with non-toxic appearance HEENT: normocephalic, atraumatic, moist mucous membranes Neck: supple, non-tender without lymphadenopathy Cardiovascular: regular rate and rhythm without murmurs, rubs, or gallops Lungs: clear to auscultation bilaterally with normal work of breathing Abdomen: soft, non-tender, non-distended, normoactive bowel sounds Skin: warm, dry, no rashes or lesions, cap refill < 2 seconds Extremities: warm and well perfused, normal tone, no edema  Assessment & Plan   No problem-specific Assessment & Plan notes found for this encounter.  No orders of the defined types were placed in this encounter.  No orders of the defined types were placed in this encounter.   Harriet [place], [place], PGY-2 04/19/2017, 11:08 AM

## 2017-04-21 ENCOUNTER — Encounter: Payer: Self-pay | Admitting: Family Medicine

## 2017-04-21 NOTE — Progress Notes (Signed)
PCP: Aretta Nip, MD  Subjective:   HPI: Patient is a 47 y.o. female here for right hip pain.  Patient reports for just over 2 weeks she has had worsening lateral right hip pain. Denies any radiation of this pain. No trauma or injury. Pain is a 7 out of 10 and sharp. Hard to lay on this side. No numbness, skin changes. No back pain.  Past Medical History:  Diagnosis Date  . Achilles tendinitis   . Achilles tendinitis   . ADHD (attention deficit hyperactivity disorder)   . Allergy   . Arthritis   . Bipolar affective ([place])   . Bipolar disorder ([place])   . Cataracts, bilateral   . Eczema   . Ganglion cyst 09/29/2009   left wrist (2 cyst)  . Hyperprolactinemia ([place])   . Hypertension   . Lipoma   . Migraine   . Personality disorder ([place])   . Pes planus     Current Outpatient Medications on File Prior to Visit  Medication Sig Dispense Refill  . divalproex (DEPAKOTE ER) 500 MG 24 hr tablet Take 3 tablets (1,500 mg total) by mouth at bedtime. For mood control 90 tablet 0  . hydrOXYzine (ATARAX/VISTARIL) 50 MG tablet Take 1 tablet (50 mg total) by mouth 3 (three) times daily as needed for anxiety. 30 tablet 0  . ibuprofen (ADVIL,MOTRIN) 600 MG tablet Take 1 tablet (600 mg total) by mouth every 8 (eight) hours as needed. 90 tablet 1  . lamoTRIgine (LAMICTAL) 200 MG tablet Take 1 tablet (200 mg total) by mouth at bedtime. For mood stability 30 tablet 0  . lurasidone (LATUDA) 80 MG TABS tablet Take 1 tablet (80 mg total) by mouth daily with supper. For mood control 30 tablet 0  . metoprolol succinate (TOPROL-XL) 100 MG 24 hr tablet Take 1 tablet (100 mg total) by mouth daily. Take with or immediately following a meal. 30 tablet 0  . traZODone (DESYREL) 50 MG tablet Take 1 tablet (50 mg total) by mouth at bedtime as needed for sleep. 30 tablet 0   No current facility-administered medications on file prior to visit.     Past Surgical History:  Procedure Laterality Date  .  ANKLE SURGERY  12/88   left   . chest nodule  1990?   rt chest wall nodule removal  . GANGLION CYST EXCISION  2011  . lipoma removal    . right bunioectomy    . SHOULDER SURGERY  01/13/2011   right, partial tear  . tumor resection left thigh      Allergies  Allergen Reactions  . Adhesive [Tape] Itching and Rash    Also reacted to Steri Strips and Band-Aids.  . Dilaudid [Hydromorphone Hcl] Itching  . Morphine Nausea And Vomiting  . Penicillins Hives    Has patient had a PCN reaction causing immediate rash, facial/tongue/throat swelling, SOB or lightheadedness with hypotension: YES Has patient had a PCN reaction causing severe rash involving mucus membranes or skin necrosis: NO Has patient had a PCN reaction that required hospitalization NO Has patient had a PCN reaction occurring within the last 10 years:NO If all of the above answers are "NO", then may proceed with Cephalosporin use.  Marland Kitchen Percocet [Oxycodone-Acetaminophen] Itching  . Prednisone Hives  . Provera [Medroxyprogesterone Acetate] Other (See Comments)    Causes manic episodes  . Ultram [Tramadol Hcl] Itching    Social History   Socioeconomic History  . Marital status: Single    Spouse name: Not  on file  . Number of children: 0  . Years of education: Not on file  . Highest education level: Not on file  Occupational History    Employer: [employer]  . Financial resource strain: Not on file  . Food insecurity:    Worry: Not on file    Inability: Not on file  . Transportation needs:    Medical: Not on file    Non-medical: Not on file  Tobacco Use  . Smoking status: Never Smoker  . Smokeless tobacco: Never Used  Substance and Sexual Activity  . Alcohol use: No    Alcohol/week: 0.0 oz  . Drug use: No  . Sexual activity: Never    Birth control/protection: Pill  Lifestyle  . Physical activity:    Days per week: Not on file    Minutes per session: Not on file  . Stress: Not on file  Relationships   . Social connections:    Talks on phone: Not on file    Gets together: Not on file    Attends religious service: Not on file    Active member of club or organization: Not on file    Attends meetings of clubs or organizations: Not on file    Relationship status: Not on file  . Intimate partner violence:    Fear of current or ex partner: Not on file    Emotionally abused: Not on file    Physically abused: Not on file    Forced sexual activity: Not on file  Other Topics Concern  . Not on file  Social History Narrative   Caffeine  2 sodas daily, 1 cup coffee daily.    Family History  Problem Relation Age of Onset  . Hypertension Mother   . Hyperlipidemia Mother   . Heart attack Father   . Heart disease Father   . Hypertension Father   . Bipolar disorder Father   . Diabetes Paternal Grandfather   . Heart disease Maternal Aunt   . Breast cancer Maternal Aunt   . Heart disease Maternal Grandmother   . Cancer Maternal Grandmother        colon    BP 111/71   Pulse 67   Ht 5\' 10"  (1.778 m)   Wt 266 lb (120.7 kg)   BMI 38.17 kg/m   Review of Systems: See HPI above.     Objective:  Physical Exam:  Gen: NAD, comfortable in exam room  Back: No gross deformity, scoliosis. No TTP .  No midline or bony TTP. FROM without pain. Strength LEs 5/5 all muscle groups except right hip abduction.   Negative SLRs. Sensation intact to light touch bilaterally.  Right hip: No deformity. There is to palpation over greater trochanteric bursa.  No other tenderness. Strength 5 out of 5 except with right hip abduction which is 4 out of 5. FROM. Negative logroll Negative fabers and piriformis stretches.   Assessment & Plan:  1.  Right hip pain: Secondary to IT band syndrome and trochanteric bursitis.  She was shown home exercises and stretches to do daily.  Tylenol and/or Aleve as needed for pain.  Try to take bursa injection given today.  She will consider physical therapy if not  improving as expected.  Follow-up in 6 weeks.  After informed written consent timeout was performed and patient was lying on left side on exam table.  Area over right greater trochanteric bursa prepped with alcohol swab and injected with 6: 2 bupivacaine: Depo-Medrol.  Patient tolerated procedure well without immediate complications.

## 2017-04-21 NOTE — Assessment & Plan Note (Signed)
Secondary to IT band syndrome and trochanteric bursitis.  She was shown home exercises and stretches to do daily.  Tylenol and/or Aleve as needed for pain.  Try to take bursa injection given today.  She will consider physical therapy if not improving as expected.  Follow-up in 6 weeks.  After informed written consent timeout was performed and patient was lying on left side on exam table.  Area over right greater trochanteric bursa prepped with alcohol swab and injected with 6: 2 bupivacaine: Depo-Medrol.  Patient tolerated procedure well without immediate complications.

## 2017-05-07 ENCOUNTER — Telehealth: Payer: Self-pay | Admitting: Adult Health

## 2017-05-07 DIAGNOSIS — G43011 Migraine without aura, intractable, with status migrainosus: Secondary | ICD-10-CM | POA: Diagnosis not present

## 2017-05-07 NOTE — Addendum Note (Signed)
Addended by: Brandon Melnick on: 05/07/2017 12:35 PM   Modules accepted: Orders

## 2017-05-07 NOTE — Telephone Encounter (Signed)
Pt calling because she has had a migraine for 5 days.  Pt is asking if she can come in for an infusion.  Please call

## 2017-05-07 NOTE — Telephone Encounter (Signed)
Ok per MM/NP to come in for depacon inufsion 500mg  po IV and may repeat x 1 if needed.  Pt will come in at 1430 today.  She confirmed time.

## 2017-05-07 NOTE — Telephone Encounter (Signed)
Spoke to pt and she has had migraine L sides throbbing for the last 4-5 days.  She is having nausea, photo-phono sensitivity , level 7 today.  Has taken otc motrin, dk room.  Has missed work.  Not helped.  She has imitrex sq in (did not take at beginning so did not think would work on day 2 of migraine).  She is asking for migraine infusion.  Please advise. Spoke  With Mindy in intrafusion and 1430 available.

## 2017-05-18 ENCOUNTER — Ambulatory Visit
Admission: RE | Admit: 2017-05-18 | Discharge: 2017-05-18 | Disposition: A | Discharge status: Home / Self Care | Payer: PPO | Source: Ambulatory Visit | Attending: Family Medicine | Admitting: Family Medicine

## 2017-05-18 DIAGNOSIS — Z1231 Encounter for screening mammogram for malignant neoplasm of breast: Secondary | ICD-10-CM | POA: Diagnosis not present

## 2017-05-22 ENCOUNTER — Encounter: Payer: Self-pay | Admitting: Physical Therapy

## 2017-05-22 ENCOUNTER — Other Ambulatory Visit: Payer: Self-pay

## 2017-05-22 ENCOUNTER — Ambulatory Visit: Payer: PPO | Attending: Family Medicine | Admitting: Physical Therapy

## 2017-05-22 DIAGNOSIS — M25551 Pain in right hip: Secondary | ICD-10-CM | POA: Diagnosis not present

## 2017-05-22 DIAGNOSIS — M62838 Other muscle spasm: Secondary | ICD-10-CM | POA: Insufficient documentation

## 2017-05-22 DIAGNOSIS — M6281 Muscle weakness (generalized): Secondary | ICD-10-CM | POA: Diagnosis not present

## 2017-05-22 DIAGNOSIS — M25512 Pain in left shoulder: Secondary | ICD-10-CM

## 2017-05-22 DIAGNOSIS — M25612 Stiffness of left shoulder, not elsewhere classified: Secondary | ICD-10-CM | POA: Diagnosis not present

## 2017-05-22 NOTE — Therapy (Signed)
[facility] [address] [place], [place], [address] Phone: [phone]   Fax:  [phone]  Physical Therapy Evaluation  Patient Details  Name: [name] MRN: [id number] Date of Birth: 11/28/1970 Referring Provider: Barbaraann Barthel   Encounter Date: 05/22/2017  PT End of Session - 05/22/17 1345    Visit Number  1    Date for PT Re-Evaluation  07/22/17    PT Start Time  1315    PT Stop Time  1410    PT Time Calculation (min)  55 min    Activity Tolerance  Patient tolerated treatment well    Behavior During Therapy  [facility] for tasks assessed/performed       Past Medical History:  Diagnosis Date  . Achilles tendinitis   . Achilles tendinitis   . ADHD (attention deficit hyperactivity disorder)   . Allergy   . Arthritis   . Bipolar affective ([place])   . Bipolar disorder ([place])   . Cataracts, bilateral   . Eczema   . Ganglion cyst 09/29/2009   left wrist (2 cyst)  . Hyperprolactinemia ([place])   . Hypertension   . Lipoma   . Migraine   . Personality disorder ([place])   . Pes planus     Past Surgical History:  Procedure Laterality Date  . ANKLE SURGERY  12/88   left   . chest nodule  1990?   rt chest wall nodule removal  . GANGLION CYST EXCISION  2011  . lipoma removal    . right bunioectomy    . SHOULDER SURGERY  01/13/2011   right, partial tear  . tumor resection left thigh      There were no vitals filed for this visit.   Subjective Assessment - 05/22/17 1316    Subjective  Patient reports that she has left shoulder and right hip pain.  She reports a fall around Christmas, she has tried injections that she reports helped "for a little while".      Limitations  Sitting;Lifting;Standing;Walking;House hold activities    Patient Stated Goals  have less pain, work easier    Currently in Pain?  Yes    Pain Location  Hip also has left shoulder pain    Pain Orientation  Right    Pain Descriptors / Indicators   Aching;Sharp;Constant;Dull    Pain Type  Acute pain    Pain Onset  More than a month ago    Pain Frequency  Constant    Aggravating Factors   sitting, standing and walking all increase the right hip pain to 10/10, reaching, lifting the left shoulder pain will be 9/10    Pain Relieving Factors  rest for the shoulder at best will be a 4/10, reports that the hip really does not get much better    Effect of Pain on Daily Activities  reports that she is limited due to pain         [facility] PT Assessment - 05/22/17 0001      Assessment   Medical Diagnosis  right hip pain and left shoulder pain    Referring Provider  [place]    Onset Date/Surgical Date  01/21/17    Prior Therapy  r      Precautions   Precautions  None      Balance Screen   Has the patient fallen in the past 6 months  Yes    How many times?  1    Has the patient  had a decrease in activity level because of a fear of falling?   No    Is the patient reluctant to leave their home because of a fear of falling?   No      Home Environment   Additional Comments  does her own housework, does the yardwork but reports that she has to stop often due to pain      Prior Function   Level of Independence  Independent    Vocation  Part time employment    Vocation Requirements  at [employer], mostly standing, lift 10 # , folding and hanging clothes    Leisure  no exercise      Posture/Postural Control   Posture Comments  fwd head, rounded shoulders      ROM / Strength   AROM / PROM / Strength  AROM;Strength      AROM   AROM Assessment Site  Shoulder;Hip    Right/Left Shoulder  Left    Left Shoulder Flexion  96 Degrees    Left Shoulder ABduction  85 Degrees    Left Shoulder Internal Rotation  65 Degrees    Left Shoulder External Rotation  80 Degrees    Right/Left Hip  Right    Right Hip Flexion  45 pain    Right Hip External Rotation   20 pain    Right Hip ABduction  10 caused pain      Strength   Overall Strength Comments  right  hip 4-/5, left shoulder 4-/5 with pain in the shoulder      Flexibility   Soft Tissue Assessment /Muscle Length  yes    Hamstrings  fair flexibility but did cause right hip pain    ITB  very tight with pain    Piriformis  very tight iwth pain      Palpation   Palpation comment  she is very tight and tender in the left upper trap and the left anterior shoulder, she is very tender in the right buttock, the right GT and the ITB area      Special Tests   Other special tests  some pain iwht left shoulder impingement test                Objective measurements completed on examination: See above findings.      [facility] Adult PT Treatment/Exercise - 05/22/17 0001      Modalities   Modalities  Electrical Stimulation;Moist Heat      Moist Heat Therapy   Number Minutes Moist Heat  15 Minutes    Moist Heat Location  Shoulder;Hip      Electrical Stimulation   Electrical Stimulation Location  left shoulder and right hip    Electrical Stimulation Action  premod    Electrical Stimulation Parameters  supine    Electrical Stimulation Goals  Pain             PT Education - 05/22/17 1344    Education provided  Yes    Education Details  corner/door stretch and piriformis stretch    Person(s) Educated  Patient    Methods  Explanation;Demonstration;Handout;Verbal cues    Comprehension  Verbalized understanding       PT Short Term Goals - 05/22/17 1350      PT SHORT TERM GOAL #1   Title  independent with initial HEP    Time  2    Period  Weeks    Status  New        PT  Long Term Goals - 05/22/17 1350      PT LONG TERM GOAL #1   Title  decrease pain 50%    Time  8    Period  Weeks    Status  New      PT LONG TERM GOAL #2   Title  report 50% less difficulty at work    Time  8    Period  Weeks    Status  New      PT LONG TERM GOAL #3   Title  increase right shoulder AROM to 140 degrees flexion    Time  8    Period  Weeks    Status  New      PT LONG TERM  GOAL #4   Title  increase right hip strength to 4+/5    Time  8    Period  Weeks    Status  New             Plan - 05/22/17 1347    Clinical Impression Statement  Patient reports a fall at Christmas time, she has right hip pain and left shoulder pain.  Has tight ITB and piriformis.  The right hip is very weak, shoulders are weak, she has very limited ROM for the right shoulder abduction and flexion.  Has spasms in the left upper traps    Clinical Presentation  Stable    Clinical Decision Making  Low    Rehab Potential  Good    PT Frequency  2x / week    PT Duration  8 weeks    PT Treatment/Interventions  ADLs/Self Care Home Management;Cryotherapy;Electrical Stimulation;Iontophoresis 4mg /ml Dexamethasone;Balance training;Therapeutic exercise;Therapeutic activities;Functional mobility training;Patient/family education;Manual techniques    PT Next Visit Plan  slowly add exercises     Consulted and Agree with Plan of Care  Patient       Patient will benefit from skilled therapeutic intervention in order to improve the following deficits and impairments:  Decreased range of motion, Difficulty walking, Increased muscle spasms, Pain, Decreased activity tolerance, Decreased balance, Impaired flexibility, Improper body mechanics, Postural dysfunction, Decreased strength, Decreased mobility  Visit Diagnosis: Acute pain of left shoulder - Plan: PT plan of care cert/re-cert  Pain in right hip - Plan: PT plan of care cert/re-cert  Muscle weakness (generalized) - Plan: PT plan of care cert/re-cert  Other muscle spasm - Plan: PT plan of care cert/re-cert  Stiffness of left shoulder, not elsewhere classified - Plan: PT plan of care cert/re-cert     Problem List Patient Active Problem List   Diagnosis Date Noted  . Left shoulder pain 02/18/2017  . MDD (major depressive disorder), recurrent severe, without psychosis ([place]) 12/28/2016  . Muscle strain 09/19/2016  . Right foot injury,  subsequent encounter 08/25/2016  . Low back pain 06/27/2016  . Left wrist injury, subsequent encounter 06/27/2016  . Pain of left thumb 10/07/2015  . 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 Strain of left thumb 02/11/2015  . Strain of right forearm 02/11/2015  . Right shoulder pain 12/31/2014  . Injury of right little finger 12/31/2014  . Episodic cluster headache, 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  . 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 Hypersomnia, recurrent 12/02/2014  . Migraine aura, persistent, intractable, with status migrainosus 12/02/2014  . Lower back injury 09/30/2014  . Bipolar I disorder, most recent episode depressed ([place])   . MDD (major depressive disorder), recurrent, severe, with psychosis ([place]) 09/23/2014  .  Injury of left index finger 08/20/2014  . Right ankle sprain 06/01/2014  . Contusion, multiple sites 06/01/2014  . Strain of right gastrocnemius muscle 06/01/2014  . Phonophobia 05/04/2014  . Photophobia of both eyes 05/04/2014  . Emotionally unstable borderline personality disorder ([place]) 05/04/2014  . Nausea with vomiting 05/04/2014  . Mixed bipolar I disorder ([place])   . Bipolar I disorder, most recent episode mixed ([place]) 04/18/2014  . Bipolar affective disorder, depressed, mild ([place]) 04/12/2014  . Suicidal ideation 04/12/2014  . Injury of right shoulder and upper arm 02/17/2014  . Left leg pain 06/03/2013  . Right hip pain 06/03/2013  . Migraine with status migrainosus 01/08/2013  . Personality disorder ([place])   . Chronic migraine 05/08/2012  . Contact dermatitis 11/27/2011  . Major depressive disorder, recurrent episode ([place]) 10/27/2011  . Generalized anxiety disorder 10/27/2011  . 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 Borderline personality disorder ([place]) 10/27/2011  . Right foot pain 09/28/2011  . 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 Ganglion cyst 09/29/2009  . PES PLANUS 07/01/2008  . BIPOLAR DISORDER UNSPECIFIED 06/09/2008    Sumner Boast., PT 05/22/2017, 1:53 PM  [address] [place], [place], [address] Phone: [phone]   Fax:  [phone]  Name: [name] MRN: [id number] Date of Birth: 1970-05-23

## 2017-05-30 ENCOUNTER — Ambulatory Visit: Payer: PPO | Attending: Family Medicine | Admitting: Physical Therapy

## 2017-05-30 ENCOUNTER — Encounter: Payer: Self-pay | Admitting: Physical Therapy

## 2017-05-30 DIAGNOSIS — M62838 Other muscle spasm: Secondary | ICD-10-CM | POA: Diagnosis not present

## 2017-05-30 DIAGNOSIS — M25551 Pain in right hip: Secondary | ICD-10-CM | POA: Diagnosis not present

## 2017-05-30 DIAGNOSIS — M25512 Pain in left shoulder: Secondary | ICD-10-CM

## 2017-05-30 DIAGNOSIS — M25612 Stiffness of left shoulder, not elsewhere classified: Secondary | ICD-10-CM | POA: Insufficient documentation

## 2017-05-30 DIAGNOSIS — M6281 Muscle weakness (generalized): Secondary | ICD-10-CM | POA: Insufficient documentation

## 2017-05-30 NOTE — Therapy (Signed)
[place], [place], [address] Phone: [phone]   Fax:  [phone]  Physical Therapy Treatment  Patient Details  Name: [name] MRN: [id number] Date of Birth: [birth year] Referring Provider: Barbaraann Barthel   Encounter Date: 05/30/2017  PT End of Session - 05/30/17 1052    Visit Number  2    Date for PT Re-Evaluation  07/22/17    PT Start Time  9449    PT Stop Time  1114    PT Time Calculation (min)  59 min    Activity Tolerance  Patient tolerated treatment well    Behavior During Therapy  [facility] for tasks assessed/performed       Past Medical History:  Diagnosis Date  . Achilles tendinitis   . Achilles tendinitis   . ADHD (attention deficit hyperactivity disorder)   . Allergy   . Arthritis   . Bipolar affective ([place])   . Bipolar disorder ([place])   . Cataracts, bilateral   . Eczema   . Ganglion cyst 09/29/2009   left wrist (2 cyst)  . Hyperprolactinemia ([place])   . Hypertension   . Lipoma   . Migraine   . Personality disorder ([place])   . Pes planus     Past Surgical History:  Procedure Laterality Date  . ANKLE SURGERY  12/88   left   . chest nodule  1990?   rt chest wall nodule removal  . GANGLION CYST EXCISION  2011  . lipoma removal    . right bunioectomy    . SHOULDER SURGERY  01/13/2011   right, partial tear  . tumor resection left thigh      There were no vitals filed for this visit.  Subjective Assessment - 05/30/17 1016    Subjective  Patient reports that she was at the [facility] with her mom and had to sit for a long period while her mom was in surgery.  Reports some increase of hip pain    Currently in Pain?  Yes    Pain Score  5     Pain Location  Hip    Pain Orientation  Right    Aggravating Factors   sitting at the hospital                       [facility] Adult PT Treatment/Exercise - 05/30/17 0001      Exercises   Exercises  Shoulder;Knee/Hip      Knee/Hip Exercises: Stretches   Passive Hamstring Stretch  3 reps;20 seconds    ITB Stretch  3 reps;20 seconds    Piriformis Stretch  3 reps;20 seconds      Knee/Hip Exercises: Aerobic   Nustep  level 5 x 5 minutes      Knee/Hip Exercises: Supine   Bridges with Ball Squeeze  2 sets;10 reps    Bridges with Clamshell  2 sets;10 reps      Shoulder Exercises: Standing   External Rotation  20 reps;Theraband    Theraband Level (Shoulder External Rotation)  Level 2 (Red)    Internal Rotation  20 reps;Theraband    Theraband Level (Shoulder Internal Rotation)  Level 2 (Red)    Extension  20 reps;Theraband    Theraband Level (Shoulder Extension)  Level 2 (Red)    Row  20 reps;Theraband    Theraband Level (Shoulder Row)  Level 2 (Red)      Moist Heat Therapy   Number Minutes  Moist Heat  15 Minutes    Moist Heat Location  Shoulder;Hip      Electrical Stimulation   Electrical Stimulation Location  left shoulder and right hip    Electrical Stimulation Action  premod    Electrical Stimulation Parameters  supine    Electrical Stimulation Goals  Pain               PT Short Term Goals - 05/30/17 1211      PT SHORT TERM GOAL #1   Title  independent with initial HEP    Status  Partially Met        PT Long Term Goals - 05/22/17 1350      PT LONG TERM GOAL #1   Title  decrease pain 50%    Time  8    Period  Weeks    Status  New      PT LONG TERM GOAL #2   Title  report 50% less difficulty at work    Time  8    Period  Weeks    Status  New      PT LONG TERM GOAL #3   Title  increase right shoulder AROM to 140 degrees flexion    Time  8    Period  Weeks    Status  New      PT LONG TERM GOAL #4   Title  increase right hip strength to 4+/5    Time  8    Period  Weeks    Status  New            Plan - 05/30/17 1052    Clinical Impression Statement  Patient reports some increased pain after sitting in waiting room while her mom had surgery.  She tolerated  the progression to some exercises well today, some c/o hip and shoulder pain, needed a lot of cues for posture and to slow down with the exercises    PT Next Visit Plan  continue to add exercises    Consulted and Agree with Plan of Care  Patient       Patient will benefit from skilled therapeutic intervention in order to improve the following deficits and impairments:  Decreased range of motion, Difficulty walking, Increased muscle spasms, Pain, Decreased activity tolerance, Decreased balance, Impaired flexibility, Improper body mechanics, Postural dysfunction, Decreased strength, Decreased mobility  Visit Diagnosis: Acute pain of left shoulder  Pain in right hip  Muscle weakness (generalized)  Other muscle spasm     Problem List Patient Active Problem List   Diagnosis Date Noted  . Left shoulder pain 02/18/2017  . MDD (major depressive disorder), recurrent severe, without psychosis ([place]) 12/28/2016  . Muscle strain 09/19/2016  . Right foot injury, subsequent encounter 08/25/2016  . Low back pain 06/27/2016  . Left wrist injury, subsequent encounter 06/27/2016  . Pain of left thumb 10/07/2015  . 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 Strain of left thumb 02/11/2015  . Strain of right forearm 02/11/2015  . Right shoulder pain 12/31/2014  . Injury of right little finger 12/31/2014  . Episodic cluster headache, 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  . 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 Hypersomnia, recurrent 12/02/2014  . Migraine aura, persistent, intractable, with status migrainosus 12/02/2014  . Lower back injury 09/30/2014  . Bipolar I disorder, most recent episode depressed ([name])   . MDD (major depressive disorder), recurrent, severe, with psychosis ([place]) 09/23/2014  . Injury  of left index finger 08/20/2014  . Right ankle sprain 06/01/2014  . Contusion, multiple sites 06/01/2014  . Strain of right  gastrocnemius muscle 06/01/2014  . Phonophobia 05/04/2014  . Photophobia of both eyes 05/04/2014  . Emotionally unstable borderline personality disorder ([place]) 05/04/2014  . Nausea with vomiting 05/04/2014  . Mixed bipolar I disorder ([place])   . Bipolar I disorder, most recent episode mixed ([place]) 04/18/2014  . Bipolar affective disorder, depressed, mild ([place]) 04/12/2014  . Suicidal ideation 04/12/2014  . Injury of right shoulder and upper arm 02/17/2014  . Left leg pain 06/03/2013  . Right hip pain 06/03/2013  . Migraine with status migrainosus 01/08/2013  . Personality disorder ([place])   . Chronic migraine 05/08/2012  . Contact dermatitis 11/27/2011  . Major depressive disorder, recurrent episode ([place]) 10/27/2011  . Generalized anxiety disorder 10/27/2011  . 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 Borderline personality disorder ([place]) 10/27/2011  . Right foot pain 09/28/2011  . 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 Ganglion cyst 09/29/2009  . PES PLANUS 07/01/2008  . BIPOLAR DISORDER UNSPECIFIED 06/09/2008    Sumner Boast., PT 05/30/2017, 12:12 PM  [address] [place], [place], [address] Phone: [phone]   Fax:  [phone]  Name: [name] MRN: [id number] Date of Birth: [birth year]

## 2017-05-31 ENCOUNTER — Ambulatory Visit: Payer: PPO | Admitting: Family Medicine

## 2017-05-31 ENCOUNTER — Encounter: Payer: Self-pay | Admitting: Family Medicine

## 2017-05-31 DIAGNOSIS — M25551 Pain in right hip: Secondary | ICD-10-CM | POA: Diagnosis not present

## 2017-05-31 NOTE — Patient Instructions (Signed)
You have IT band syndrome and trochanteric bursitis, glut medius weakness. Avoid painful activities as much as possible. Ice over area of pain 3-4 times a day for 15 minutes at a time Standing hip rotations and hip side raise exercise 3 sets of 10 once a day - add weights if this becomes too easy. Stretches - pick 2-3 and hold for 20-30 seconds x 3 - do once or twice a day. Continue physical therapy. Tylenol and/or aleve as needed for pain. Follow up with me in 6 weeks.

## 2017-06-03 ENCOUNTER — Encounter: Payer: Self-pay | Admitting: Family Medicine

## 2017-06-03 NOTE — Progress Notes (Signed)
PCP: Aretta Nip, MD  Subjective:   HPI: Patient is a 47 y.o. female here for right hip pain.  3/21: Patient reports for just over 2 weeks she has had worsening lateral right hip pain. Denies any radiation of this pain. No trauma or injury. Pain is a 7 out of 10 and sharp. Hard to lay on this side. No numbness, skin changes. No back pain.  5/2: Patient reports she feels only slightly better compared to last visit. Not much benefit with the injection. Has just started PT and gone 3 times - doing home exercises and stretches. Stiffness with prolonged sitting. Pain level 6/10. No skin changes.  Past Medical History:  Diagnosis Date  . Achilles tendinitis   . Achilles tendinitis   . ADHD (attention deficit hyperactivity disorder)   . Allergy   . Arthritis   . Bipolar affective ([place])   . Bipolar disorder ([place])   . Cataracts, bilateral   . Eczema   . Ganglion cyst 09/29/2009   left wrist (2 cyst)  . Hyperprolactinemia ([place])   . Hypertension   . Lipoma   . Migraine   . Personality disorder ([place])   . Pes planus     Current Outpatient Medications on File Prior to Visit  Medication Sig Dispense Refill  . divalproex (DEPAKOTE ER) 500 MG 24 hr tablet Take 3 tablets (1,500 mg total) by mouth at bedtime. For mood control 90 tablet 0  . hydrOXYzine (ATARAX/VISTARIL) 50 MG tablet Take 1 tablet (50 mg total) by mouth 3 (three) times daily as needed for anxiety. 30 tablet 0  . hydrOXYzine (VISTARIL) 50 MG capsule TK ONE C PO TID PRA  2  . ibuprofen (ADVIL,MOTRIN) 600 MG tablet Take 1 tablet (600 mg total) by mouth every 8 (eight) hours as needed. 90 tablet 1  . lamoTRIgine (LAMICTAL) 150 MG tablet Take 300 mg by mouth daily.    Marland Kitchen levocetirizine (XYZAL) 5 MG tablet Take 5 mg by mouth every evening.  1  . lurasidone (LATUDA) 80 MG TABS tablet Take 1 tablet (80 mg total) by mouth daily with supper. For mood control 30 tablet 0  . metoprolol succinate (TOPROL-XL) 100 MG 24 hr  tablet Take 1 tablet (100 mg total) by mouth daily. Take with or immediately following a meal. 30 tablet 0  . traZODone (DESYREL) 50 MG tablet Take 1 tablet (50 mg total) by mouth at bedtime as needed for sleep. (Patient taking differently: Take 100 mg by mouth. ) 30 tablet 0   Current Facility-Administered Medications on File Prior to Visit  Medication Dose Route Frequency Provider Last Rate Last Dose  . methylPREDNISolone acetate (DEPO-MEDROL) injection 40 mg  40 mg Intra-articular Once Jonni Oelkers, Sharyn Lull, MD        Past Surgical History:  Procedure Laterality Date  . ANKLE SURGERY  12/88   left   . chest nodule  1990?   rt chest wall nodule removal  . GANGLION CYST EXCISION  2011  . lipoma removal    . right bunioectomy    . SHOULDER SURGERY  01/13/2011   right, partial tear  . tumor resection left thigh      Allergies  Allergen Reactions  . Adhesive [Tape] Itching and Rash    Also reacted to Steri Strips and Band-Aids.  . Dilaudid [Hydromorphone Hcl] Itching  . Morphine Nausea And Vomiting  . Penicillins Hives    Has patient had a PCN reaction causing immediate rash, facial/tongue/throat swelling, SOB or  lightheadedness with hypotension: YES Has patient had a PCN reaction causing severe rash involving mucus membranes or skin necrosis: NO Has patient had a PCN reaction that required hospitalization NO Has patient had a PCN reaction occurring within the last 10 years:NO If all of the above answers are "NO", then may proceed with Cephalosporin use.  Marland Kitchen Percocet [Oxycodone-Acetaminophen] Itching  . Prednisone Hives  . Provera [Medroxyprogesterone Acetate] Other (See Comments)    Causes manic episodes  . Ultram [Tramadol Hcl] Itching    Social History   Socioeconomic History  . Marital status: Single    Spouse name: Not on file  . Number of children: 0  . Years of education: Not on file  . Highest education level: Not on file  Occupational History    Employer: [employer]  . Financial resource strain: Not on file  . Food insecurity:    Worry: Not on file    Inability: Not on file  . Transportation needs:    Medical: Not on file    Non-medical: Not on file  Tobacco Use  . Smoking status: Never Smoker  . Smokeless tobacco: Never Used  Substance and Sexual Activity  . Alcohol use: No    Alcohol/week: 0.0 oz  . Drug use: No  . Sexual activity: Never    Birth control/protection: Pill  Lifestyle  . Physical activity:    Days per week: Not on file    Minutes per session: Not on file  . Stress: Not on file  Relationships  . Social connections:    Talks on phone: Not on file    Gets together: Not on file    Attends religious service: Not on file    Active member of club or organization: Not on file    Attends meetings of clubs or organizations: Not on file    Relationship status: Not on file  . Intimate partner violence:    Fear of current or ex partner: Not on file    Emotionally abused: Not on file    Physically abused: Not on file    Forced sexual activity: Not on file  Other Topics Concern  . Not on file  Social History Narrative   Caffeine  2 sodas daily, 1 cup coffee daily.    Family History  Problem Relation Age of Onset  . Hypertension Mother   . Hyperlipidemia Mother   . Breast cancer Mother   . Heart attack Father   . Heart disease Father   . Hypertension Father   . Bipolar disorder Father   . Diabetes Paternal Grandfather   . Heart disease Maternal Aunt   . Breast cancer Maternal Aunt   . Heart disease Maternal Grandmother   . Cancer Maternal Grandmother        colon    BP 130/76   Pulse 64   Ht 5\' 10"  (1.778 m)   Wt 260 lb (117.9 kg)   BMI 37.31 kg/m   Review of Systems: See HPI above.     Objective:  Physical Exam:  Gen: NAD, comfortable in exam room  Back: No gross deformity, scoliosis. No TTP .  No midline or bony TTP. FROM. Strength LEs 5/5 all muscle groups.   Negative  SLRs. Sensation intact to light touch bilaterally.  Right hip: No deformity. TTP over trochanteric bursa, proximal IT band, just distal to trochanter. Strength 5/5 except right hip abduction 5-/5. FROM. Negative logroll. Negative fabers and piriformis. NVI distally.  Assessment &  Plan:  1.  Right hip pain: 2/2 IT band syndrome, trochanteric bursitis, glut medius weakness.  Continue physical therapy, home exercises.  Icing, tylenol and/or aleve.  Injection didn't provide much benefit.  F/u in 6 weeks.

## 2017-06-03 NOTE — Assessment & Plan Note (Signed)
2/2 IT band syndrome, trochanteric bursitis, glut medius weakness.  Continue physical therapy, home exercises.  Icing, tylenol and/or aleve.  Injection didn't provide much benefit.  F/u in 6 weeks.

## 2017-06-05 ENCOUNTER — Encounter: Payer: Self-pay | Admitting: Physical Therapy

## 2017-06-05 ENCOUNTER — Ambulatory Visit: Payer: PPO | Admitting: Physical Therapy

## 2017-06-05 DIAGNOSIS — M25612 Stiffness of left shoulder, not elsewhere classified: Secondary | ICD-10-CM

## 2017-06-05 DIAGNOSIS — M25512 Pain in left shoulder: Secondary | ICD-10-CM

## 2017-06-05 DIAGNOSIS — M6281 Muscle weakness (generalized): Secondary | ICD-10-CM

## 2017-06-05 DIAGNOSIS — M62838 Other muscle spasm: Secondary | ICD-10-CM

## 2017-06-05 DIAGNOSIS — M25551 Pain in right hip: Secondary | ICD-10-CM

## 2017-06-05 NOTE — Therapy (Signed)
[place] [address] [place], [place], [address] Phone: [phone]   Fax:  [phone]  Physical Therapy Treatment  Patient Details  Name: [name] MRN: [id number] Date of Birth: [birth year] Referring Provider: Barbaraann Barthel   Encounter Date: 06/05/2017  PT End of Session - 06/05/17 1055    Visit Number  3    Date for PT Re-Evaluation  07/22/17    PT Start Time  1021    PT Stop Time  1120    PT Time Calculation (min)  59 min    Activity Tolerance  Patient tolerated treatment well    Behavior During Therapy  [facility] for tasks assessed/performed       Past Medical History:  Diagnosis Date  . Achilles tendinitis   . Achilles tendinitis   . ADHD (attention deficit hyperactivity disorder)   . Allergy   . Arthritis   . Bipolar affective ([place])   . Bipolar disorder ([place])   . Cataracts, bilateral   . Eczema   . Ganglion cyst 09/29/2009   left wrist (2 cyst)  . Hyperprolactinemia ([place])   . Hypertension   . Lipoma   . Migraine   . Personality disorder ([place])   . Pes planus     Past Surgical History:  Procedure Laterality Date  . ANKLE SURGERY  12/88   left   . chest nodule  1990?   rt chest wall nodule removal  . GANGLION CYST EXCISION  2011  . lipoma removal    . right bunioectomy    . SHOULDER SURGERY  01/13/2011   right, partial tear  . tumor resection left thigh      There were no vitals filed for this visit.  Subjective Assessment - 06/05/17 1025    Subjective  Patient reports that she is very tired, she tried to mow the back yard yesterday, reports that she had to stop due to fatigue and some pain.    Currently in Pain?  Yes    Pain Score  7     Pain Location  Hip    Pain Orientation  Right    Pain Descriptors / Indicators  Aching;Dull    Aggravating Factors   trying to mow the yard increased hip pain                       [facility] Adult PT Treatment/Exercise - 06/05/17 0001       Ambulation/Gait   Gait Comments  Gait with patient around the building, down slope and up slope, she became very fatigued going up hill and started limping much more toward the end of the lap      Knee/Hip Exercises: Supine   Other Supine Knee/Hip Exercises  supine feet on ball K2C, trunk rotation and small bridges, abdominal isometric pushes into ball      Shoulder Exercises: Standing   External Rotation  20 reps;Theraband    Theraband Level (Shoulder External Rotation)  Level 2 (Red)    Internal Rotation  20 reps;Theraband    Theraband Level (Shoulder Internal Rotation)  Level 2 (Red)    Extension  20 reps;Theraband    Theraband Level (Shoulder Extension)  Level 2 (Red)    Row  20 reps;Theraband    Theraband Level (Shoulder Row)  Level 2 (Red)    Other Standing Exercises  wand exercises all shoulder motions      Shoulder Exercises: ROM/Strengthening   UBE (Upper  Arm Bike)  level 3 x 6 minutes      Moist Heat Therapy   Number Minutes Moist Heat  15 Minutes    Moist Heat Location  Shoulder;Hip      Electrical Stimulation   Electrical Stimulation Location  left shoulder and right hip    Electrical Stimulation Action  premod    Electrical Stimulation Parameters  supine    Electrical Stimulation Goals  Pain               PT Short Term Goals - 06/05/17 1057      PT SHORT TERM GOAL #1   Title  independent with initial HEP    Status  Achieved        PT Long Term Goals - 05/22/17 1350      PT LONG TERM GOAL #1   Title  decrease pain 50%    Time  8    Period  Weeks    Status  New      PT LONG TERM GOAL #2   Title  report 50% less difficulty at work    Time  8    Period  Weeks    Status  New      PT LONG TERM GOAL #3   Title  increase right shoulder AROM to 140 degrees flexion    Time  8    Period  Weeks    Status  New      PT LONG TERM GOAL #4   Title  increase right hip strength to 4+/5    Time  8    Period  Weeks    Status  New             Plan - 06/05/17 1055    Clinical Impression Statement  Patient mowed the yard, was only able to do half of the back yard due to fatigue and pain in the hip, she needs constant verbal cues to keep scapular retraction, due to rounded shoulders and forward head.  Had to really rest after the walk around the building and had a significant limp on right at end of walk    PT Next Visit Plan  continue to add exercises    Consulted and Agree with Plan of Care  Patient       Patient will benefit from skilled therapeutic intervention in order to improve the following deficits and impairments:  Decreased range of motion, Difficulty walking, Increased muscle spasms, Pain, Decreased activity tolerance, Decreased balance, Impaired flexibility, Improper body mechanics, Postural dysfunction, Decreased strength, Decreased mobility  Visit Diagnosis: Acute pain of left shoulder  Pain in right hip  Muscle weakness (generalized)  Other muscle spasm  Stiffness of left shoulder, not elsewhere classified     Problem List Patient Active Problem List   Diagnosis Date Noted  . Left shoulder pain 02/18/2017  . MDD (major depressive disorder), recurrent severe, without psychosis ([place]) 12/28/2016  . Muscle strain 09/19/2016  . Right foot injury, subsequent encounter 08/25/2016  . Low back pain 06/27/2016  . Left wrist injury, subsequent encounter 06/27/2016  . Pain of left thumb 10/07/2015  . 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 Strain of left thumb 02/11/2015  . Strain of right forearm 02/11/2015  . Right shoulder pain 12/31/2014  . Injury of right little finger 12/31/2014  . Episodic cluster headache, 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  . 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 Hypersomnia, recurrent 12/02/2014  .  Migraine aura, persistent, intractable, with status migrainosus 12/02/2014  . Lower back injury 09/30/2014  .  Bipolar I disorder, most recent episode depressed ([place])   . MDD (major depressive disorder), recurrent, severe, with psychosis ([place]) 09/23/2014  . Injury of left index finger 08/20/2014  . Right ankle sprain 06/01/2014  . Contusion, multiple sites 06/01/2014  . Strain of right gastrocnemius muscle 06/01/2014  . Phonophobia 05/04/2014  . Photophobia of both eyes 05/04/2014  . Emotionally unstable borderline personality disorder ([place]) 05/04/2014  . Nausea with vomiting 05/04/2014  . Mixed bipolar I disorder ([place])   . Bipolar I disorder, most recent episode mixed ([place]) 04/18/2014  . Bipolar affective disorder, depressed, mild ([place]) 04/12/2014  . Suicidal ideation 04/12/2014  . Injury of right shoulder and upper arm 02/17/2014  . Left leg pain 06/03/2013  . Right hip pain 06/03/2013  . Migraine with status migrainosus 01/08/2013  . Personality disorder ([place])   . Chronic migraine 05/08/2012  . Contact dermatitis 11/27/2011  . Major depressive disorder, recurrent episode ([place]) 10/27/2011  . Generalized anxiety disorder 10/27/2011  . 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 Borderline personality disorder ([place]) 10/27/2011  . Right foot pain 09/28/2011  . 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 Ganglion cyst 09/29/2009  . PES PLANUS 07/01/2008  . BIPOLAR DISORDER UNSPECIFIED 06/09/2008    Sumner Boast., PT 06/05/2017, 10:57 AM  [address] [place], [place], [address] Phone: [phone]   Fax:  [phone]  Name: ARAINA BUTRICK MRN: [id number] Date of Birth: 05/28/1970

## 2017-06-06 ENCOUNTER — Encounter: Payer: Self-pay | Admitting: Physical Therapy

## 2017-06-06 ENCOUNTER — Ambulatory Visit: Payer: PPO | Admitting: Physical Therapy

## 2017-06-06 DIAGNOSIS — M25551 Pain in right hip: Secondary | ICD-10-CM

## 2017-06-06 DIAGNOSIS — M25512 Pain in left shoulder: Secondary | ICD-10-CM | POA: Diagnosis not present

## 2017-06-06 DIAGNOSIS — M6281 Muscle weakness (generalized): Secondary | ICD-10-CM

## 2017-06-06 DIAGNOSIS — M25612 Stiffness of left shoulder, not elsewhere classified: Secondary | ICD-10-CM

## 2017-06-06 DIAGNOSIS — M62838 Other muscle spasm: Secondary | ICD-10-CM

## 2017-06-06 NOTE — Therapy (Signed)
[place], [place], [address] Phone: [phone]   Fax:  [phone]  Physical Therapy Treatment  Patient Details  Name: [name] MRN: [id number] Date of Birth: October 06, 1970 Referring Provider: Barbaraann Barthel   Encounter Date: 06/06/2017  PT End of Session - 06/06/17 0800    Visit Number  4    Date for PT Re-Evaluation  07/22/17    PT Start Time  0747       Past Medical History:  Diagnosis Date  . Achilles tendinitis   . Achilles tendinitis   . ADHD (attention deficit hyperactivity disorder)   . Allergy   . Arthritis   . Bipolar affective ([place])   . Bipolar disorder ([place])   . Cataracts, bilateral   . Eczema   . Ganglion cyst 09/29/2009   left wrist (2 cyst)  . Hyperprolactinemia ([place])   . Hypertension   . Lipoma   . Migraine   . Personality disorder ([place])   . Pes planus     Past Surgical History:  Procedure Laterality Date  . ANKLE SURGERY  12/88   left   . chest nodule  1990?   rt chest wall nodule removal  . GANGLION CYST EXCISION  2011  . lipoma removal    . right bunioectomy    . SHOULDER SURGERY  01/13/2011   right, partial tear  . tumor resection left thigh      There were no vitals filed for this visit.  Subjective Assessment - 06/06/17 0752    Subjective  Patient reports that she worked last night and that her right hip "buckled" on her, she reports that she feels that she was just fatigued and tired    Currently in Pain?  Yes    Pain Score  7     Pain Location  Hip    Pain Orientation  Right    Aggravating Factors   standing, walking                       [facility] Adult PT Treatment/Exercise - 06/06/17 0001      Knee/Hip Exercises: Stretches   Passive Hamstring Stretch  3 reps;20 seconds    Quad Stretch  3 reps;20 seconds    ITB Stretch  3 reps;20 seconds    Piriformis Stretch  3 reps;20 seconds      Knee/Hip Exercises: Aerobic   Nustep   level 5 x 6 minutes      Knee/Hip Exercises: Supine   Other Supine Knee/Hip Exercises  supine feet on ball K2C, trunk rotation and small bridges, abdominal isometric pushes into ball      Shoulder Exercises: Standing   External Rotation  20 reps;Theraband    Other Standing Exercises  wand exercises all shoulder motions      Moist Heat Therapy   Number Minutes Moist Heat  15 Minutes    Moist Heat Location  Shoulder;Hip      Electrical Stimulation   Electrical Stimulation Location  left shoulder and right hip    Electrical Stimulation Action  premod     Electrical Stimulation Parameters  supine    Electrical Stimulation Goals  Pain               PT Short Term Goals - 06/05/17 1057      PT SHORT TERM GOAL #1   Title  independent with initial HEP    Status  Achieved  PT Long Term Goals - 05/22/17 1350      PT LONG TERM GOAL #1   Title  decrease pain 50%    Time  8    Period  Weeks    Status  New      PT LONG TERM GOAL #2   Title  report 50% less difficulty at work    Time  8    Period  Weeks    Status  New      PT LONG TERM GOAL #3   Title  increase right shoulder AROM to 140 degrees flexion    Time  8    Period  Weeks    Status  New      PT LONG TERM GOAL #4   Title  increase right hip strength to 4+/5    Time  8    Period  Weeks    Status  New            Plan - 06/06/17 9562    Clinical Impression Statement  Patient had some increased right hip pain last night and reports that the hip gave way, she did mow half a yard, came to PT and then worked until 9:30PM last night, she reports that she still has to mow the rest of the yard today.  She continues to need constant verbal cues to correct posture, has forward shoulders and rounded shoulders    PT Next Visit Plan  She will be working 3 days in a row and then will have a vacation where she will have to do some walking and standing, assess and add exercises as tolerated    Consulted and Agree  with Plan of Care  Patient       Patient will benefit from skilled therapeutic intervention in order to improve the following deficits and impairments:  Decreased range of motion, Difficulty walking, Increased muscle spasms, Pain, Decreased activity tolerance, Decreased balance, Impaired flexibility, Improper body mechanics, Postural dysfunction, Decreased strength, Decreased mobility  Visit Diagnosis: Acute pain of left shoulder  Pain in right hip  Muscle weakness (generalized)  Other muscle spasm  Stiffness of left shoulder, not elsewhere classified     Problem List Patient Active Problem List   Diagnosis Date Noted  . Left shoulder pain 02/18/2017  . MDD (major depressive disorder), recurrent severe, without psychosis ([place]) 12/28/2016  . Muscle strain 09/19/2016  . Right foot injury, subsequent encounter 08/25/2016  . Low back pain 06/27/2016  . Left wrist injury, subsequent encounter 06/27/2016  . Pain of left thumb 10/07/2015  . 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 Strain of left thumb 02/11/2015  . Strain of right forearm 02/11/2015  . Right shoulder pain 12/31/2014  . Injury of right little finger 12/31/2014  . Episodic cluster headache, 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  . 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 Hypersomnia, recurrent 12/02/2014  . Migraine aura, persistent, intractable, with status migrainosus 12/02/2014  . Lower back injury 09/30/2014  . Bipolar I disorder, most recent episode depressed ([place])   . MDD (major depressive disorder), recurrent, severe, with psychosis ([place]) 09/23/2014  . Injury of left index finger 08/20/2014  . Right ankle sprain 06/01/2014  . Contusion, multiple sites 06/01/2014  . Strain of right gastrocnemius muscle 06/01/2014  . Phonophobia 05/04/2014  . Photophobia of both eyes 05/04/2014  . Emotionally unstable borderline personality disorder ([place]) 05/04/2014   . Nausea with vomiting 05/04/2014  . Mixed  bipolar I disorder ([place])   . Bipolar I disorder, most recent episode mixed ([place]) 04/18/2014  . Bipolar affective disorder, depressed, mild ([place]) 04/12/2014  . Suicidal ideation 04/12/2014  . Injury of right shoulder and upper arm 02/17/2014  . Left leg pain 06/03/2013  . Right hip pain 06/03/2013  . Migraine with status migrainosus 01/08/2013  . Personality disorder ([place])   . Chronic migraine 05/08/2012  . Contact dermatitis 11/27/2011  . Major depressive disorder, recurrent episode ([place]) 10/27/2011  . Generalized anxiety disorder 10/27/2011  . 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 Borderline personality disorder ([place]) 10/27/2011  . Right foot pain 09/28/2011  . 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 Ganglion cyst 09/29/2009  . PES PLANUS 07/01/2008  . BIPOLAR DISORDER UNSPECIFIED 06/09/2008    Sumner Boast., PT 06/06/2017, 9:45 AM  [place] [address] [place], [place], [address] Phone: [phone]   Fax:  [phone]  Name: [name] MRN: [id number] Date of Birth: 1970-10-05

## 2017-06-19 ENCOUNTER — Encounter: Payer: Self-pay | Admitting: Physical Therapy

## 2017-06-19 ENCOUNTER — Ambulatory Visit: Payer: PPO | Admitting: Physical Therapy

## 2017-06-19 DIAGNOSIS — M25512 Pain in left shoulder: Secondary | ICD-10-CM | POA: Diagnosis not present

## 2017-06-19 DIAGNOSIS — M25551 Pain in right hip: Secondary | ICD-10-CM

## 2017-06-19 DIAGNOSIS — M6281 Muscle weakness (generalized): Secondary | ICD-10-CM

## 2017-06-19 DIAGNOSIS — M62838 Other muscle spasm: Secondary | ICD-10-CM

## 2017-06-19 DIAGNOSIS — M25612 Stiffness of left shoulder, not elsewhere classified: Secondary | ICD-10-CM

## 2017-06-19 NOTE — Therapy (Signed)
[place], [place], [address] Phone: [phone]   Fax:  [phone]  Physical Therapy Treatment  Patient Details  Name: [name] MRN: [id number] Date of Birth: 05-03-1970 Referring Provider: Barbaraann Barthel   Encounter Date: 06/19/2017  PT End of Session - 06/19/17 0831    Visit Number  5    Date for PT Re-Evaluation  07/22/17    PT Start Time  0804    PT Stop Time  0853    PT Time Calculation (min)  49 min    Activity Tolerance  Patient tolerated treatment well    Behavior During Therapy  [facility] for tasks assessed/performed       Past Medical History:  Diagnosis Date  . Achilles tendinitis   . Achilles tendinitis   . ADHD (attention deficit hyperactivity disorder)   . Allergy   . Arthritis   . Bipolar affective ([place])   . Bipolar disorder ([place])   . Cataracts, bilateral   . Eczema   . Ganglion cyst 09/29/2009   left wrist (2 cyst)  . Hyperprolactinemia ([place])   . Hypertension   . Lipoma   . Migraine   . Personality disorder ([place])   . Pes planus     Past Surgical History:  Procedure Laterality Date  . ANKLE SURGERY  12/88   left   . chest nodule  1990?   rt chest wall nodule removal  . GANGLION CYST EXCISION  2011  . lipoma removal    . right bunioectomy    . SHOULDER SURGERY  01/13/2011   right, partial tear  . tumor resection left thigh      There were no vitals filed for this visit.  Subjective Assessment - 06/19/17 0808    Subjective  Patient went to [place], reports that she was tired, had some increased shoulder and hip pain    Currently in Pain?  Yes    Pain Score  5     Pain Location  Shoulder    Pain Orientation  Left    Pain Descriptors / Indicators  Aching    Aggravating Factors   stairs                       [facility] Adult PT Treatment/Exercise - 06/19/17 0001      Ambulation/Gait   Gait Comments  Gait with patient around the building,  down slope and up slope, she became very fatigued going up hill and started limping much more toward the end of the lap      Knee/Hip Exercises: Aerobic   Nustep  level 5 x 6 minutes      Knee/Hip Exercises: Supine   Other Supine Knee/Hip Exercises  supine feet on ball K2C, trunk rotation and small bridges, abdominal isometric pushes into ball      Shoulder Exercises: Standing   External Rotation  20 reps;Theraband    Theraband Level (Shoulder External Rotation)  Level 2 (Red)    Extension  20 reps;Theraband    Theraband Level (Shoulder Extension)  Level 2 (Red)    Row  20 reps;Theraband    Theraband Level (Shoulder Row)  Level 2 (Red)    Other Standing Exercises  wand exercises all shoulder motions      Shoulder Exercises: ROM/Strengthening   UBE (Upper Arm Bike)  level 3 x 6 minutes      Moist Heat Therapy   Number Minutes Moist  Heat  15 Minutes    Moist Heat Location  Shoulder;Hip      Electrical Stimulation   Electrical Stimulation Location  left shoulder and right hip    Electrical Stimulation Action  premod    Electrical Stimulation Parameters  supine    Electrical Stimulation Goals  Pain               PT Short Term Goals - 06/05/17 1057      PT SHORT TERM GOAL #1   Title  independent with initial HEP    Status  Achieved        PT Long Term Goals - 06/19/17 0841      PT LONG TERM GOAL #1   Title  decrease pain 50%    Status  On-going      PT LONG TERM GOAL #2   Title  report 50% less difficulty at work    Status  On-going      PT LONG TERM GOAL #3   Title  increase right shoulder AROM to 140 degrees flexion    Status  On-going      PT LONG TERM GOAL #4   Title  increase right hip strength to 4+/5    Status  On-going            Plan - 06/19/17 4496    Clinical Impression Statement  Patient reports that she is feeling a little stronger and was pleased that she was able to go toe the [place].  She does have difficulty reaching up and out,  does c/o fatigue, has increased limp with walking, pain is lateral hip about 7/10    PT Next Visit Plan  Continue to work on her overall strength and function    Consulted and Agree with Plan of Care  Patient       Patient will benefit from skilled therapeutic intervention in order to improve the following deficits and impairments:  Decreased range of motion, Difficulty walking, Increased muscle spasms, Pain, Decreased activity tolerance, Decreased balance, Impaired flexibility, Improper body mechanics, Postural dysfunction, Decreased strength, Decreased mobility  Visit Diagnosis: Acute pain of left shoulder  Pain in right hip  Muscle weakness (generalized)  Other muscle spasm  Stiffness of left shoulder, not elsewhere classified     Problem List Patient Active Problem List   Diagnosis Date Noted  . Left shoulder pain 02/18/2017  . MDD (major depressive disorder), recurrent severe, without psychosis ([place]) 12/28/2016  . Muscle strain 09/19/2016  . Right foot injury, subsequent encounter 08/25/2016  . Low back pain 06/27/2016  . Left wrist injury, subsequent encounter 06/27/2016  . Pain of left thumb 10/07/2015  . 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 Strain of left thumb 02/11/2015  . Strain of right forearm 02/11/2015  . Right shoulder pain 12/31/2014  . Injury of right little finger 12/31/2014  . Episodic cluster headache, 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  . 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 Hypersomnia, recurrent 12/02/2014  . Migraine aura, persistent, intractable, with status migrainosus 12/02/2014  . Lower back injury 09/30/2014  . Bipolar I disorder, most recent episode depressed ([place])   . MDD (major depressive disorder), recurrent, severe, with psychosis ([place]) 09/23/2014  . Injury of left index finger 08/20/2014  . Right ankle sprain 06/01/2014  . Contusion, multiple sites 06/01/2014  .  Strain of right gastrocnemius muscle 06/01/2014  . Phonophobia 05/04/2014  . Photophobia of both eyes 05/04/2014  .  Emotionally unstable borderline personality disorder ([place]) 05/04/2014  . Nausea with vomiting 05/04/2014  . Mixed bipolar I disorder ([place])   . Bipolar I disorder, most recent episode mixed ([place]) 04/18/2014  . Bipolar affective disorder, depressed, mild ([place]) 04/12/2014  . Suicidal ideation 04/12/2014  . Injury of right shoulder and upper arm 02/17/2014  . Left leg pain 06/03/2013  . Right hip pain 06/03/2013  . Migraine with status migrainosus 01/08/2013  . Personality disorder ([place])   . Chronic migraine 05/08/2012  . Contact dermatitis 11/27/2011  . Major depressive disorder, recurrent episode ([place]) 10/27/2011  . Generalized anxiety disorder 10/27/2011  . 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 Borderline personality disorder ([place]) 10/27/2011  . Right foot pain 09/28/2011  . 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 Ganglion cyst 09/29/2009  . PES PLANUS 07/01/2008  . BIPOLAR DISORDER UNSPECIFIED 06/09/2008    Sumner Boast., PT 06/19/2017, 8:43 AM  [place] [address] [place], [place], [address] Phone: [phone]   Fax:  [phone]  Name: REVIA NGHIEM MRN: [id number] Date of Birth: 11/03/1970

## 2017-06-22 ENCOUNTER — Encounter: Payer: Self-pay | Admitting: Physical Therapy

## 2017-06-22 ENCOUNTER — Ambulatory Visit: Payer: PPO | Admitting: Physical Therapy

## 2017-06-22 DIAGNOSIS — M25551 Pain in right hip: Secondary | ICD-10-CM

## 2017-06-22 DIAGNOSIS — M6281 Muscle weakness (generalized): Secondary | ICD-10-CM

## 2017-06-22 DIAGNOSIS — M62838 Other muscle spasm: Secondary | ICD-10-CM

## 2017-06-22 DIAGNOSIS — M25512 Pain in left shoulder: Secondary | ICD-10-CM | POA: Diagnosis not present

## 2017-06-22 DIAGNOSIS — M25612 Stiffness of left shoulder, not elsewhere classified: Secondary | ICD-10-CM

## 2017-06-22 NOTE — Therapy (Signed)
[place], [place], [address] Phone: [phone]   Fax:  [phone]  Physical Therapy Treatment  Patient Details  Name: [name] MRN: [id number] Date of Birth: [birth year] Referring Provider: Barbaraann Barthel   Encounter Date: 06/22/2017  PT End of Session - 06/22/17 0837    Visit Number  6    Date for PT Re-Evaluation  07/22/17    PT Start Time  0750    PT Stop Time  0850    PT Time Calculation (min)  60 min    Activity Tolerance  Patient tolerated treatment well    Behavior During Therapy  [name] for tasks assessed/performed       Past Medical History:  Diagnosis Date  . Achilles tendinitis   . Achilles tendinitis   . ADHD (attention deficit hyperactivity disorder)   . Allergy   . Arthritis   . Bipolar affective ([place])   . Bipolar disorder ([place])   . Cataracts, bilateral   . Eczema   . Ganglion cyst 09/29/2009   left wrist (2 cyst)  . Hyperprolactinemia ([place])   . Hypertension   . Lipoma   . Migraine   . Personality disorder ([place])   . Pes planus     Past Surgical History:  Procedure Laterality Date  . ANKLE SURGERY  12/88   left   . chest nodule  1990?   rt chest wall nodule removal  . GANGLION CYST EXCISION  2011  . lipoma removal    . right bunioectomy    . SHOULDER SURGERY  01/13/2011   right, partial tear  . tumor resection left thigh      There were no vitals filed for this visit.  Subjective Assessment - 06/22/17 0757    Subjective  Patient reports that she worked last night and the hip"gave me a fit"   Reports that she was on her feet for 8 hours.    Currently in Pain?  Yes    Pain Score  6     Pain Location  Hip    Pain Orientation  Right                       [facility] Adult PT Treatment/Exercise - 06/22/17 0001      Ambulation/Gait   Gait Comments  Gait with patient around the building, down slope and up slope, she became very fatigued going up  hill and started limping much more toward the end of the lap      Knee/Hip Exercises: Stretches   Passive Hamstring Stretch  3 reps;20 seconds    Quad Stretch  3 reps;20 seconds    ITB Stretch  3 reps;20 seconds    Piriformis Stretch  3 reps;20 seconds      Knee/Hip Exercises: Aerobic   Nustep  level 5 x 6 minutes      Knee/Hip Exercises: Machines for Strengthening   Cybex Knee Extension  5# 2x10    Cybex Knee Flexion  25# 2x10    Cybex Leg Press  20# 2x10      Knee/Hip Exercises: Supine   Other Supine Knee/Hip Exercises  supine feet on ball K2C, trunk rotation and small bridges, abdominal isometric pushes into ball      Shoulder Exercises: ROM/Strengthening   UBE (Upper Arm Bike)  level 3 x 6 minutes    Lat Pull  2 plate;20 reps    Cybex Press  1  plate;20 reps    Cybex Row  2 plate;20 reps      Moist Heat Therapy   Number Minutes Moist Heat  15 Minutes    Moist Heat Location  Shoulder;Hip      Electrical Stimulation   Electrical Stimulation Location  left shoulder and right hip    Electrical Stimulation Action  premod    Electrical Stimulation Parameters  supine    Electrical Stimulation Goals  Pain               PT Short Term Goals - 06/05/17 1057      PT SHORT TERM GOAL #1   Title  independent with initial HEP    Status  Achieved        PT Long Term Goals - 06/19/17 0841      PT LONG TERM GOAL #1   Title  decrease pain 50%    Status  On-going      PT LONG TERM GOAL #2   Title  report 50% less difficulty at work    Status  On-going      PT LONG TERM GOAL #3   Title  increase right shoulder AROM to 140 degrees flexion    Status  On-going      PT LONG TERM GOAL #4   Title  increase right hip strength to 4+/5    Status  On-going            Plan - 06/22/17 0837    Clinical Impression Statement  Patient had difficulty walking up slope, she was very fatigued and started to walk very slowly with some left foot dragging.  She tolerated the  other exercises without difficulty    PT Next Visit Plan  Continue to work on her overall strength and function    Consulted and Agree with Plan of Care  Patient       Patient will benefit from skilled therapeutic intervention in order to improve the following deficits and impairments:  Decreased range of motion, Difficulty walking, Increased muscle spasms, Pain, Decreased activity tolerance, Decreased balance, Impaired flexibility, Improper body mechanics, Postural dysfunction, Decreased strength, Decreased mobility  Visit Diagnosis: Acute pain of left shoulder  Pain in right hip  Muscle weakness (generalized)  Other muscle spasm  Stiffness of left shoulder, not elsewhere classified     Problem List Patient Active Problem List   Diagnosis Date Noted  . Left shoulder pain 02/18/2017  . MDD (major depressive disorder), recurrent severe, without psychosis ([place]) 12/28/2016  . Muscle strain 09/19/2016  . Right foot injury, subsequent encounter 08/25/2016  . Low back pain 06/27/2016  . Left wrist injury, subsequent encounter 06/27/2016  . Pain of left thumb 10/07/2015  . 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 Strain of left thumb 02/11/2015  . Strain of right forearm 02/11/2015  . Right shoulder pain 12/31/2014  . Injury of right little finger 12/31/2014  . Episodic cluster headache, 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  . 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 Hypersomnia, recurrent 12/02/2014  . Migraine aura, persistent, intractable, with status migrainosus 12/02/2014  . Lower back injury 09/30/2014  . Bipolar I disorder, most recent episode depressed ([place])   . MDD (major depressive disorder), recurrent, severe, with psychosis ([place]) 09/23/2014  . Injury of left index finger 08/20/2014  . Right ankle sprain 06/01/2014  . Contusion, multiple sites 06/01/2014  . Strain of right gastrocnemius muscle 06/01/2014  .  Phonophobia  05/04/2014  . Photophobia of both eyes 05/04/2014  . Emotionally unstable borderline personality disorder ([place]) 05/04/2014  . Nausea with vomiting 05/04/2014  . Mixed bipolar I disorder ([place])   . Bipolar I disorder, most recent episode mixed ([place]) 04/18/2014  . Bipolar affective disorder, depressed, mild ([place]) 04/12/2014  . Suicidal ideation 04/12/2014  . Injury of right shoulder and upper arm 02/17/2014  . Left leg pain 06/03/2013  . Right hip pain 06/03/2013  . Migraine with status migrainosus 01/08/2013  . Personality disorder ([place])   . Chronic migraine 05/08/2012  . Contact dermatitis 11/27/2011  . Major depressive disorder, recurrent episode ([place]) 10/27/2011  . Generalized anxiety disorder 10/27/2011  . 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 Borderline personality disorder ([place]) 10/27/2011  . Right foot pain 09/28/2011  . 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 Ganglion cyst 09/29/2009  . PES PLANUS 07/01/2008  . BIPOLAR DISORDER UNSPECIFIED 06/09/2008    Sumner Boast., PT 06/22/2017, 8:38 AM  [facility] [address] [place], [place], [address] Phone: [phone]   Fax:  [phone]  Name: LORAY AKARD MRN: [id number] Date of Birth: 1971-01-24

## 2017-06-26 ENCOUNTER — Ambulatory Visit: Payer: PPO | Admitting: Adult Health

## 2017-06-26 ENCOUNTER — Encounter: Payer: Self-pay | Admitting: Adult Health

## 2017-06-26 VITALS — BP 114/76 | HR 62 | Ht 70.0 in | Wt 260.0 lb

## 2017-06-26 DIAGNOSIS — IMO0002 Reserved for concepts with insufficient information to code with codable children: Secondary | ICD-10-CM

## 2017-06-26 DIAGNOSIS — G43709 Chronic migraine without aura, not intractable, without status migrainosus: Secondary | ICD-10-CM

## 2017-06-26 MED ORDER — SUMATRIPTAN SUCCINATE REFILL 6 MG/0.5ML ~~LOC~~ SOCT: SUBCUTANEOUS | 5 refills | Status: DC

## 2017-06-26 NOTE — Progress Notes (Signed)
PATIENT: [name] DOB: Mar 13, 1970  REASON FOR VISIT: follow up HISTORY FROM: patient  HISTORY OF PRESENT ILLNESS: Today 06/26/17:  Ms. Blecher is a 47 year old female with a history of migraine headaches.  She returns today for follow-up.  She reports that she typically has one headache a week.  She reports often her headaches can last up to 3 days.  She does have sumatriptan injections but does not use it regularly.  She continues on Depakote and Lamictal as prescribed by her psychiatrist.  She states that her depression is under relatively good control.  Reports that she did have a bad week last week and had to see her therapist 3 times.  She reports that her mom was recently diagnosed with breast cancer and will be undergoing radiation.  Her dad also recently passed away.  She reports that she had a couple of falls.  She is in physical therapy for her left shoulder and right hip.  She reports on occasion she will have dizzy episodes.  She also has episodes of nausea that occur randomly with no other symptoms.  She does use over-the-counter medication such as Advil to treat her headaches.  She returns today for evaluation.    REVIEW OF SYSTEMS: Out of a complete 14 system review of symptoms, the patient complains only of the following symptoms, and all other reviewed systems are negative.  See HPI  ALLERGIES: Allergies  Allergen Reactions  . Adhesive [Tape] Itching and Rash    Also reacted to Steri Strips and Band-Aids.  . Dilaudid [Hydromorphone Hcl] Itching  . Morphine Nausea And Vomiting  . Penicillins Hives    Has patient had a PCN reaction causing immediate rash, facial/tongue/throat swelling, SOB or lightheadedness with hypotension: YES Has patient had a PCN reaction causing severe rash involving mucus membranes or skin necrosis: NO Has patient had a PCN reaction that required hospitalization NO Has patient had a PCN reaction occurring within the last 10 years:NO If  all of the above answers are "NO", then may proceed with Cephalosporin use.  Marland Kitchen Percocet [Oxycodone-Acetaminophen] Itching  . Prednisone Hives  . Provera [Medroxyprogesterone Acetate] Other (See Comments)    Causes manic episodes  . Ultram [Tramadol Hcl] Itching    HOME MEDICATIONS: Outpatient Medications Prior to Visit  Medication Sig Dispense Refill  . divalproex (DEPAKOTE ER) 500 MG 24 hr tablet Take 3 tablets (1,500 mg total) by mouth at bedtime. For mood control 90 tablet 0  . hydrOXYzine (VISTARIL) 50 MG capsule TK ONE C PO TID PRA  2  . ibuprofen (ADVIL,MOTRIN) 600 MG tablet Take 1 tablet (600 mg total) by mouth every 8 (eight) hours as needed. 90 tablet 1  . lamoTRIgine (LAMICTAL) 100 MG tablet Take 200 mg by mouth daily.    Marland Kitchen levocetirizine (XYZAL) 5 MG tablet Take 5 mg by mouth every evening.  1  . lurasidone (LATUDA) 80 MG TABS tablet Take 1 tablet (80 mg total) by mouth daily with supper. For mood control 30 tablet 0  . metoprolol succinate (TOPROL-XL) 100 MG 24 hr tablet Take 1 tablet (100 mg total) by mouth daily. Take with or immediately following a meal. 30 tablet 0  . traZODone (DESYREL) 50 MG tablet Take 1 tablet (50 mg total) by mouth at bedtime as needed for sleep. (Patient taking differently: Take 100 mg by mouth. ) 30 tablet 0  . hydrOXYzine (ATARAX/VISTARIL) 50 MG tablet Take 1 tablet (50 mg total) by mouth 3 (three) times  daily as needed for anxiety. 30 tablet 0  . lamoTRIgine (LAMICTAL) 150 MG tablet Take 300 mg by mouth daily.     Facility-Administered Medications Prior to Visit  Medication Dose Route Frequency Provider Last Rate Last Dose  . methylPREDNISolone acetate (DEPO-MEDROL) injection 40 mg  40 mg Intra-articular Once Hudnall, Sharyn Lull, MD        PAST MEDICAL HISTORY: Past Medical History:  Diagnosis Date  . Achilles tendinitis   . Achilles tendinitis   . ADHD (attention deficit hyperactivity disorder)   . Allergy   . Arthritis   . Bipolar affective  ([place])   . Bipolar disorder ([place])   . Cataracts, bilateral   . Eczema   . Ganglion cyst 09/29/2009   left wrist (2 cyst)  . Hyperprolactinemia ([place])   . Hypertension   . Lipoma   . Migraine   . Personality disorder ([place])   . Pes planus     PAST SURGICAL HISTORY: Past Surgical History:  Procedure Laterality Date  . ANKLE SURGERY  12/88   left   . chest nodule  1990?   rt chest wall nodule removal  . GANGLION CYST EXCISION  2011  . lipoma removal    . right bunioectomy    . SHOULDER SURGERY  01/13/2011   right, partial tear  . tumor resection left thigh      FAMILY HISTORY: Family History  Problem Relation Age of Onset  . Hypertension Mother   . Hyperlipidemia Mother   . Breast cancer Mother   . Heart attack Father   . Heart disease Father   . Hypertension Father   . Bipolar disorder Father   . Diabetes Paternal Grandfather   . Heart disease Maternal Aunt   . Breast cancer Maternal Aunt   . Heart disease Maternal Grandmother   . Cancer Maternal Grandmother        colon    SOCIAL HISTORY: Social History   Socioeconomic History  . Marital status: Single    Spouse name: Not on file  . Number of children: 0  . Years of education: Not on file  . Highest education level: Not on file  Occupational History    Employer: [employer]  . Financial resource strain: Not on file  . Food insecurity:    Worry: Not on file    Inability: Not on file  . Transportation needs:    Medical: Not on file    Non-medical: Not on file  Tobacco Use  . Smoking status: Never Smoker  . Smokeless tobacco: Never Used  Substance and Sexual Activity  . Alcohol use: No    Alcohol/week: 0.0 oz  . Drug use: No  . Sexual activity: Never    Birth control/protection: Pill  Lifestyle  . Physical activity:    Days per week: Not on file    Minutes per session: Not on file  . Stress: Not on file  Relationships  . Social connections:    Talks on phone: Not on file    Gets  together: Not on file    Attends religious service: Not on file    Active member of club or organization: Not on file    Attends meetings of clubs or organizations: Not on file    Relationship status: Not on file  . Intimate partner violence:    Fear of current or ex partner: Not on file    Emotionally abused: Not on file    Physically abused: Not  on file    Forced sexual activity: Not on file  Other Topics Concern  . Not on file  Social History Narrative   Caffeine  2 sodas daily, 1 cup coffee daily.      PHYSICAL EXAM  Vitals:   06/26/17 0747  BP: 114/76  Pulse: 62  Weight: 260 lb (117.9 kg)  Height: 5\' 10"  (1.778 m)   Body mass index is 37.31 kg/m.  Generalized: Well developed, in no acute distress   Neurological examination  Mentation: Alert oriented to time, place, history taking. Follows all commands speech and language fluent Cranial nerve II-XII: Pupils were equal round reactive to light. Extraocular movements were full, visual field were full on confrontational test. Facial sensation and strength were normal. Uvula tongue midline. Head turning and shoulder shrug  were normal and symmetric. Motor: The motor testing reveals 5 over 5 strength of all 4 extremities. Good symmetric motor tone is noted throughout.  Sensory: Sensory testing is intact to soft touch on all 4 extremities. No evidence of extinction is noted.  Coordination: Cerebellar testing reveals good finger-nose-finger and heel-to-shin bilaterally.  Gait and station: Gait is normal. Tandem gait is normal. Romberg is negative. No drift is seen.  Reflexes: Deep tendon reflexes are symmetric and normal bilaterally.   DIAGNOSTIC DATA (LABS, IMAGING, TESTING) - I reviewed patient records, labs, notes, testing and imaging myself where available.  Lab Results  Component Value Date   WBC 7.4 12/29/2016   HGB 15.2 (H) 12/29/2016   HCT 44.6 12/29/2016   MCV 93.9 12/29/2016   PLT 241 12/29/2016        Component Value Date/Time   NA 140 12/29/2016 0627   NA 143 02/21/2016 1017   K 3.9 12/29/2016 0627   CL 104 12/29/2016 0627   CO2 28 12/29/2016 0627   GLUCOSE 88 12/29/2016 0627   BUN 16 12/29/2016 0627   BUN 10 02/21/2016 1017   CREATININE 1.09 (H) 12/29/2016 0627   CREATININE 0.83 09/11/2011 1524   CALCIUM 9.5 12/29/2016 0627   PROT 6.3 (L) 12/29/2016 0627   PROT 5.6 (L) 02/21/2016 1017   ALBUMIN 3.8 12/29/2016 0627   ALBUMIN 3.9 02/21/2016 1017   AST 27 12/29/2016 0627   ALT 14 12/29/2016 0627   ALKPHOS 60 12/29/2016 0627   BILITOT 0.6 12/29/2016 0627   BILITOT 0.5 02/21/2016 1017   GFRNONAA 59 (L) 12/29/2016 0627   GFRAA >60 12/29/2016 0627   Lab Results  Component Value Date   CHOL 198 12/29/2016   HDL 53 12/29/2016   LDLCALC 118 (H) 12/29/2016   TRIG 136 12/29/2016   CHOLHDL 3.7 12/29/2016   Lab Results  Component Value Date   HGBA1C 5.1 12/29/2016   No results found for: [id number] Lab Results  Component Value Date   TSH 4.288 12/29/2016      ASSESSMENT AND PLAN 47 y.o. year old female  has a past medical history of Achilles tendinitis, Achilles tendinitis, ADHD (attention deficit hyperactivity disorder), Allergy, Arthritis, Bipolar affective ([place]), Bipolar disorder ([place]), Cataracts, bilateral, Eczema, Ganglion cyst (09/29/2009), Hyperprolactinemia ([place]), Hypertension, Lipoma, Migraine, Personality disorder ([place]), and Pes planus. here with:  1.  Migraine headache  The patient will continue on Depakote and lamictal- both prescribed by her psychiatrist.  I have encouraged the patient to take sumatriptan injection as soon as the headache starts.  Hopefully this will eliminate the headaches from lasting 3 days.  The patient does have a migraine today.  This is day 2 of her  migraine.  If her headache does not resolve by tomorrow she will let us know and we will do an infusion.  She is advised that if her symptoms worsen or she develops new symptoms she should let  us know.  She will follow-up in 6 months or sooner if needed.  I spent 15 minutes with the patient. 50% of this time was spent discussing her medication-imitrex.  Ward Givens, MSN, NP-C 06/26/2017, 8:01 AM [facility] [address] [place], [place] [address] [phone]

## 2017-06-26 NOTE — Patient Instructions (Signed)
Your Plan:  Continue Depakote and lamictal  Take Imitrex at the onset of migraine- can repeat in 2 hours if needed.  If your symptoms worsen or you develop new symptoms please let us know.   Thank you for coming to see [place] at [facility]. I hope we have been able to provide you high quality care today.  You may receive a patient satisfaction survey over the next few weeks. We would appreciate your feedback and comments so that we may continue to improve ourselves and the health of our patients.

## 2017-06-29 NOTE — Progress Notes (Signed)
I agree with the assessment and plan as directed by NP .The patient is known to me .   Natha Guin, MD  

## 2017-07-02 ENCOUNTER — Ambulatory Visit: Payer: PPO

## 2017-07-04 ENCOUNTER — Ambulatory Visit: Payer: PPO | Attending: Family Medicine | Admitting: Physical Therapy

## 2017-07-04 DIAGNOSIS — M62838 Other muscle spasm: Secondary | ICD-10-CM | POA: Diagnosis not present

## 2017-07-04 DIAGNOSIS — M25612 Stiffness of left shoulder, not elsewhere classified: Secondary | ICD-10-CM | POA: Diagnosis not present

## 2017-07-04 DIAGNOSIS — M6281 Muscle weakness (generalized): Secondary | ICD-10-CM | POA: Diagnosis not present

## 2017-07-04 DIAGNOSIS — M25512 Pain in left shoulder: Secondary | ICD-10-CM

## 2017-07-04 DIAGNOSIS — M25551 Pain in right hip: Secondary | ICD-10-CM | POA: Diagnosis not present

## 2017-07-04 NOTE — Therapy (Signed)
[facility] [place], [place], [address] Phone: [phone]   Fax:  [phone]  Physical Therapy Treatment  Patient Details  Name: [name] MRN: [id number] Date of Birth: 1970-07-09 Referring Provider: Barbaraann Barthel   Encounter Date: 07/04/2017  PT End of Session - 07/04/17 1150    Visit Number  7    Date for PT Re-Evaluation  07/22/17    PT Start Time  1100    PT Stop Time  1204    PT Time Calculation (min)  64 min    Activity Tolerance  Patient tolerated treatment well    Behavior During Therapy  [facility] for tasks assessed/performed       Past Medical History:  Diagnosis Date  . Achilles tendinitis   . Achilles tendinitis   . ADHD (attention deficit hyperactivity disorder)   . Allergy   . Arthritis   . Bipolar affective ([place])   . Bipolar disorder ([place])   . Cataracts, bilateral   . Eczema   . Ganglion cyst 09/29/2009   left wrist (2 cyst)  . Hyperprolactinemia ([place])   . Hypertension   . Lipoma   . Migraine   . Personality disorder ([place])   . Pes planus     Past Surgical History:  Procedure Laterality Date  . ANKLE SURGERY  12/88   left   . chest nodule  1990?   rt chest wall nodule removal  . GANGLION CYST EXCISION  2011  . lipoma removal    . right bunioectomy    . SHOULDER SURGERY  01/13/2011   right, partial tear  . tumor resection left thigh      There were no vitals filed for this visit.  Subjective Assessment - 07/04/17 1105    Subjective  Pt reports that's her shoulder still feels loose, she stated that her hip give out on her but not as often    Currently in Pain?  No/denies    Pain Score  0-No pain                       [facility] Adult PT Treatment/Exercise - 07/04/17 0001      Ambulation/Gait   Gait Comments  Gait with patient around the building, down slope and up slope, she became very fatigued going up hill and started limping much more toward the end  of the lap      Knee/Hip Exercises: Stretches   Passive Hamstring Stretch  3 reps;20 seconds    Piriformis Stretch  3 reps;20 seconds      Knee/Hip Exercises: Aerobic   Nustep  level 5 x 7 minutes      Knee/Hip Exercises: Machines for Strengthening   Cybex Knee Extension  5# 2x10    Cybex Knee Flexion  25# 2x10    Cybex Leg Press  20# 2x10      Shoulder Exercises: ROM/Strengthening   UBE (Upper Arm Bike)  level 3 x 6 minutes    Lat Pull  2 plate;20 reps    Cybex Press  1 plate;20 reps    Cybex Row  2 plate;20 reps      Moist Heat Therapy   Number Minutes Moist Heat  15 Minutes    Moist Heat Location  Shoulder;Hip      Electrical Stimulation   Electrical Stimulation Location  left shoulder and right hip    Electrical Stimulation Action  premod    Electrical  Stimulation Parameters  supine    Electrical Stimulation Goals  Pain               PT Short Term Goals - 06/05/17 1057      PT SHORT TERM GOAL #1   Title  independent with initial HEP    Status  Achieved        PT Long Term Goals - 06/19/17 0841      PT LONG TERM GOAL #1   Title  decrease pain 50%    Status  On-going      PT LONG TERM GOAL #2   Title  report 50% less difficulty at work    Status  On-going      PT LONG TERM GOAL #3   Title  increase right shoulder AROM to 140 degrees flexion    Status  On-going      PT LONG TERM GOAL #4   Title  increase right hip strength to 4+/5    Status  On-going            Plan - 07/04/17 1151    Clinical Impression Statement  Pt continues to have difficulty ambulating up hill, she reports some pain and weakness, needing one standing rest break. L foot does slightly drag on occasion. No difficulty with machine exercises.    Rehab Potential  Good    PT Frequency  2x / week    PT Duration  8 weeks    PT Treatment/Interventions  ADLs/Self Care Home Management;Cryotherapy;Electrical Stimulation;Iontophoresis 4mg /ml Dexamethasone;Balance  training;Therapeutic exercise;Therapeutic activities;Functional mobility training;Patient/family education;Manual techniques    PT Next Visit Plan  Continue to work on her overall strength and function       Patient will benefit from skilled therapeutic intervention in order to improve the following deficits and impairments:  Decreased range of motion, Difficulty walking, Increased muscle spasms, Pain, Decreased activity tolerance, Decreased balance, Impaired flexibility, Improper body mechanics, Postural dysfunction, Decreased strength, Decreased mobility  Visit Diagnosis: Acute pain of left shoulder  Muscle weakness (generalized)  Other muscle spasm  Pain in right hip  Stiffness of left shoulder, not elsewhere classified     Problem List Patient Active Problem List   Diagnosis Date Noted  . Left shoulder pain 02/18/2017  . MDD (major depressive disorder), recurrent severe, without psychosis ([place]) 12/28/2016  . Muscle strain 09/19/2016  . Right foot injury, subsequent encounter 08/25/2016  . Low back pain 06/27/2016  . Left wrist injury, subsequent encounter 06/27/2016  . Pain of left thumb 10/07/2015  . 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 Strain of left thumb 02/11/2015  . Strain of right forearm 02/11/2015  . Right shoulder pain 12/31/2014  . Injury of right little finger 12/31/2014  . Episodic cluster headache, 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  . 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 Hypersomnia, recurrent 12/02/2014  . Migraine aura, persistent, intractable, with status migrainosus 12/02/2014  . Lower back injury 09/30/2014  . Bipolar I disorder, most recent episode depressed ([place])   . MDD (major depressive disorder), recurrent, severe, with psychosis ([place]) 09/23/2014  . Injury of left index finger 08/20/2014  . Right ankle sprain 06/01/2014  . Contusion, multiple sites 06/01/2014  . Strain of  right gastrocnemius muscle 06/01/2014  . Phonophobia 05/04/2014  . Photophobia of both eyes 05/04/2014  . Emotionally unstable borderline personality disorder ([place]) 05/04/2014  . Nausea with vomiting 05/04/2014  . Mixed bipolar I disorder ([place])   .  Bipolar I disorder, most recent episode mixed ([place]) 04/18/2014  . Bipolar affective disorder, depressed, mild ([place]) 04/12/2014  . Suicidal ideation 04/12/2014  . Injury of right shoulder and upper arm 02/17/2014  . Left leg pain 06/03/2013  . Right hip pain 06/03/2013  . Migraine with status migrainosus 01/08/2013  . Personality disorder ([place])   . Chronic migraine 05/08/2012  . Contact dermatitis 11/27/2011  . Major depressive disorder, recurrent episode ([place]) 10/27/2011  . Generalized anxiety disorder 10/27/2011  . 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 Borderline personality disorder ([place]) 10/27/2011  . Right foot pain 09/28/2011  . 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 Ganglion cyst 09/29/2009  . PES PLANUS 07/01/2008  . BIPOLAR DISORDER UNSPECIFIED 06/09/2008    Scot Jun, PTA 07/04/2017, 11:59 AM  [facility] [place], [place], [address] Phone: [phone]   Fax:  [phone]  Name: [name] MRN: [id number] Date of Birth: [birth year]

## 2017-07-06 ENCOUNTER — Telehealth: Payer: Self-pay | Admitting: Adult Health

## 2017-07-06 DIAGNOSIS — R32 Unspecified urinary incontinence: Secondary | ICD-10-CM | POA: Diagnosis not present

## 2017-07-06 DIAGNOSIS — R300 Dysuria: Secondary | ICD-10-CM | POA: Diagnosis not present

## 2017-07-06 DIAGNOSIS — R110 Nausea: Secondary | ICD-10-CM | POA: Diagnosis not present

## 2017-07-06 DIAGNOSIS — G43001 Migraine without aura, not intractable, with status migrainosus: Secondary | ICD-10-CM | POA: Diagnosis not present

## 2017-07-06 NOTE — Telephone Encounter (Signed)
I spoke pt and she has had migraine for 3-4 days now and has taken imitrex inj which has helped some, but not taken it away totally.   I recommended that she contact her pcp acutely as our office is closing at 1200 and will not be able to get infusion today.  She will do this and call us on Monday if needed.

## 2017-07-06 NOTE — Telephone Encounter (Signed)
Pt called stating she has had a migriane for about 3 days now, stating that she has tried all medications nothing has touched the migraine. Requesting to come in for an infusion, aware out office closes at noon and that our infusion suit isn't here today. Please call to advise

## 2017-07-09 ENCOUNTER — Ambulatory Visit: Payer: PPO | Admitting: Physical Therapy

## 2017-07-09 ENCOUNTER — Encounter: Payer: Self-pay | Admitting: Physical Therapy

## 2017-07-09 DIAGNOSIS — M25512 Pain in left shoulder: Secondary | ICD-10-CM | POA: Diagnosis not present

## 2017-07-09 DIAGNOSIS — M62838 Other muscle spasm: Secondary | ICD-10-CM

## 2017-07-09 DIAGNOSIS — M25551 Pain in right hip: Secondary | ICD-10-CM

## 2017-07-09 DIAGNOSIS — M25612 Stiffness of left shoulder, not elsewhere classified: Secondary | ICD-10-CM

## 2017-07-09 DIAGNOSIS — M6281 Muscle weakness (generalized): Secondary | ICD-10-CM

## 2017-07-09 NOTE — Therapy (Signed)
[place], [place], [address] Phone: [phone]   Fax:  [phone]  Physical Therapy Treatment  Patient Details  Name: [name] MRN: [id number] Date of Birth: 14-Aug-1970 Referring Provider: Barbaraann Barthel   Encounter Date: 07/09/2017  PT End of Session - 07/09/17 1350    Visit Number  8    Date for PT Re-Evaluation  07/22/17    PT Start Time  1300    PT Stop Time  1401    PT Time Calculation (min)  61 min    Activity Tolerance  Patient tolerated treatment well;Patient limited by fatigue    Behavior During Therapy  [facility] for tasks assessed/performed       Past Medical History:  Diagnosis Date  . Achilles tendinitis   . Achilles tendinitis   . ADHD (attention deficit hyperactivity disorder)   . Allergy   . Arthritis   . Bipolar affective ([place])   . Bipolar disorder ([place])   . Cataracts, bilateral   . Eczema   . Ganglion cyst 09/29/2009   left wrist (2 cyst)  . Hyperprolactinemia ([place])   . Hypertension   . Lipoma   . Migraine   . Personality disorder ([place])   . Pes planus     Past Surgical History:  Procedure Laterality Date  . ANKLE SURGERY  12/88   left   . chest nodule  1990?   rt chest wall nodule removal  . GANGLION CYST EXCISION  2011  . lipoma removal    . right bunioectomy    . SHOULDER SURGERY  01/13/2011   right, partial tear  . tumor resection left thigh      There were no vitals filed for this visit.  Subjective Assessment - 07/09/17 1304    Subjective  Pt repots that she had a 3 day migraine that ended Friday. Woke up Saturday migraine was gone. Pt reports that she feel like "Crap" today    Currently in Pain?  Yes    Pain Score  5     Pain Location  -- R hip 5/10, L shoulder 6                       [facility] Adult PT Treatment/Exercise - 07/09/17 0001      Ambulation/Gait   Gait Comments  one flight of stairs then back down outside up hill.  Standing rest break after one flight, then seated rest before going up hill       Knee/Hip Exercises: Stretches   Passive Hamstring Stretch  3 reps;20 seconds    Quad Stretch  3 reps;20 seconds    Piriformis Stretch  3 reps;20 seconds      Knee/Hip Exercises: Aerobic   Nustep  level 5 x 7 minutes      Knee/Hip Exercises: Machines for Strengthening   Cybex Knee Extension  5# 2x10    Cybex Knee Flexion  25# 2x10    Cybex Leg Press  30# 2x10      Shoulder Exercises: ROM/Strengthening   Lat Pull  2 plate;20 reps    Cybex Press  1 plate;20 reps    Cybex Row  2 plate;20 reps      Moist Heat Therapy   Number Minutes Moist Heat  15 Minutes    Moist Heat Location  Shoulder;Hip      Electrical Stimulation   Electrical Stimulation Location  left shoulder and right hip  Electrical Stimulation Action  premod    Electrical Stimulation Parameters  supine    Electrical Stimulation Goals  Pain               PT Short Term Goals - 06/05/17 1057      PT SHORT TERM GOAL #1   Title  independent with initial HEP    Status  Achieved        PT Long Term Goals - 06/19/17 0841      PT LONG TERM GOAL #1   Title  decrease pain 50%    Status  On-going      PT LONG TERM GOAL #2   Title  report 50% less difficulty at work    Status  On-going      PT LONG TERM GOAL #3   Title  increase right shoulder AROM to 140 degrees flexion    Status  On-going      PT LONG TERM GOAL #4   Title  increase right hip strength to 4+/5    Status  On-going            Plan - 07/09/17 1350    Clinical Impression Statement  Pt enters clinic reporting increase fatigue, recovering from along migraine. Increase rest needed during stair negotiation and uphill walking. No issues with machine level exercises. Good HS flexibility in both LE's.    Rehab Potential  Good    PT Frequency  2x / week    PT Duration  8 weeks    PT Treatment/Interventions  ADLs/Self Care Home  Management;Cryotherapy;Electrical Stimulation;Iontophoresis 4mg /ml Dexamethasone;Balance training;Therapeutic exercise;Therapeutic activities;Functional mobility training;Patient/family education;Manual techniques    PT Next Visit Plan  Continue to work on her overall strength and function       Patient will benefit from skilled therapeutic intervention in order to improve the following deficits and impairments:  Decreased range of motion, Difficulty walking, Increased muscle spasms, Pain, Decreased activity tolerance, Decreased balance, Impaired flexibility, Improper body mechanics, Postural dysfunction, Decreased strength, Decreased mobility  Visit Diagnosis: Acute pain of left shoulder  Muscle weakness (generalized)  Other muscle spasm  Pain in right hip  Stiffness of left shoulder, not elsewhere classified     Problem List Patient Active Problem List   Diagnosis Date Noted  . Left shoulder pain 02/18/2017  . MDD (major depressive disorder), recurrent severe, without psychosis ([place]) 12/28/2016  . Muscle strain 09/19/2016  . Right foot injury, subsequent encounter 08/25/2016  . Low back pain 06/27/2016  . Left wrist injury, subsequent encounter 06/27/2016  . Pain of left thumb 10/07/2015  . 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 Strain of left thumb 02/11/2015  . Strain of right forearm 02/11/2015  . Right shoulder pain 12/31/2014  . Injury of right little finger 12/31/2014  . Episodic cluster headache, 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  . 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 Hypersomnia, recurrent 12/02/2014  . Migraine aura, persistent, intractable, with status migrainosus 12/02/2014  . Lower back injury 09/30/2014  . Bipolar I disorder, most recent episode depressed ([place])   . MDD (major depressive disorder), recurrent, severe, with psychosis ([place]) 09/23/2014  . Injury of left index finger 08/20/2014  .  Right ankle sprain 06/01/2014  . Contusion, multiple sites 06/01/2014  . Strain of right gastrocnemius muscle 06/01/2014  . Phonophobia 05/04/2014  . Photophobia of both eyes 05/04/2014  . Emotionally unstable borderline personality disorder ([place]) 05/04/2014  . Nausea with vomiting 05/04/2014  .  Mixed bipolar I disorder ([place])   . Bipolar I disorder, most recent episode mixed ([place]) 04/18/2014  . Bipolar affective disorder, depressed, mild ([place]) 04/12/2014  . Suicidal ideation 04/12/2014  . Injury of right shoulder and upper arm 02/17/2014  . Left leg pain 06/03/2013  . Right hip pain 06/03/2013  . Migraine with status migrainosus 01/08/2013  . Personality disorder ([place])   . Chronic migraine 05/08/2012  . Contact dermatitis 11/27/2011  . Major depressive disorder, recurrent episode ([place]) 10/27/2011  . Generalized anxiety disorder 10/27/2011  . 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 Borderline personality disorder ([place]) 10/27/2011  . Right foot pain 09/28/2011  . 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 Ganglion cyst 09/29/2009  . PES PLANUS 07/01/2008  . BIPOLAR DISORDER UNSPECIFIED 06/09/2008    Scot Jun, PTA 07/09/2017, 1:53 PM  [place], [place], [address] Phone: [phone]   Fax:  [phone]  Name: [name] MRN: [id number] Date of Birth: April 20, 1970

## 2017-07-10 ENCOUNTER — Telehealth: Payer: Self-pay | Admitting: Adult Health

## 2017-07-10 NOTE — Telephone Encounter (Signed)
Spoke to pt and she is still struggling with migraine pressure L side.  Could not really give me a level.   She feels like she has some chest heaviness, she relates as possible anxiety.  She is also c/o dizziness/ off balanceness that she sometimes has with her migraines.  She went to [facility] after hours and had toradol benadryl, ? Other IM injection and when to sleep and was better but has never really went away.  Inrafusion has availabilty tomorrow 1130 to 1330 if this option.  Please advise.

## 2017-07-10 NOTE — Telephone Encounter (Signed)
Patient states her migraines and dizziness have gotten much worse even with taking prescribed medications.. Also she was seen at [facility] after hours last Friday for a headache and was given 3 shots. Please call and discuss.

## 2017-07-10 NOTE — Telephone Encounter (Signed)
I LMVM for pt that was returning call about her migraines.

## 2017-07-11 ENCOUNTER — Encounter: Payer: Self-pay | Admitting: *Deleted

## 2017-07-11 DIAGNOSIS — G43011 Migraine without aura, intractable, with status migrainosus: Secondary | ICD-10-CM | POA: Diagnosis not present

## 2017-07-11 NOTE — Telephone Encounter (Addendum)
I spoke to pt and she will come in for depacon infusion per MM/NP Depacon 500mg  IV.  Also is asking about new injectable - AIMOVIG.  Please advise if this would be an option for her.   Per MM/NP will see how she does on the depacon infusion and then discuss.  She will come today at 1130 with intrafusion, spoke with Tasha and relayed this to her with orders and medlist/ allergy sheet.

## 2017-07-12 ENCOUNTER — Ambulatory Visit: Payer: PPO | Admitting: Physical Therapy

## 2017-07-12 ENCOUNTER — Encounter: Payer: Self-pay | Admitting: Physical Therapy

## 2017-07-12 DIAGNOSIS — M62838 Other muscle spasm: Secondary | ICD-10-CM

## 2017-07-12 DIAGNOSIS — M25551 Pain in right hip: Secondary | ICD-10-CM

## 2017-07-12 DIAGNOSIS — M25512 Pain in left shoulder: Secondary | ICD-10-CM

## 2017-07-12 DIAGNOSIS — M6281 Muscle weakness (generalized): Secondary | ICD-10-CM

## 2017-07-12 DIAGNOSIS — M25612 Stiffness of left shoulder, not elsewhere classified: Secondary | ICD-10-CM

## 2017-07-12 NOTE — Therapy (Signed)
[place], [place], [address] Phone: [phone]   Fax:  [phone]  Physical Therapy Treatment  Patient Details  Name: [name] MRN: [id number] Date of Birth: 1970/10/08 Referring Provider: Barbaraann Barthel   Encounter Date: 07/12/2017  PT End of Session - 07/12/17 1556    Visit Number  9    Date for PT Re-Evaluation  07/22/17    PT Start Time  8546    PT Stop Time  1609    PT Time Calculation (min)  54 min    Activity Tolerance  Patient tolerated treatment well;Patient limited by fatigue    Behavior During Therapy  [facility] for tasks assessed/performed       Past Medical History:  Diagnosis Date  . Achilles tendinitis   . Achilles tendinitis   . ADHD (attention deficit hyperactivity disorder)   . Allergy   . Arthritis   . Bipolar affective ([place])   . Bipolar disorder ([place])   . Cataracts, bilateral   . Eczema   . Ganglion cyst 09/29/2009   left wrist (2 cyst)  . Hyperprolactinemia ([place])   . Hypertension   . Lipoma   . Migraine   . Personality disorder ([place])   . Pes planus     Past Surgical History:  Procedure Laterality Date  . ANKLE SURGERY  12/88   left   . chest nodule  1990?   rt chest wall nodule removal  . GANGLION CYST EXCISION  2011  . lipoma removal    . right bunioectomy    . SHOULDER SURGERY  01/13/2011   right, partial tear  . tumor resection left thigh      There were no vitals filed for this visit.  Subjective Assessment - 07/12/17 1523    Subjective  Pt reports that she was trying to take a seat last nigh in a poorly lit room, missed the chair catching herself with the LUE. No injuries reported.    Currently in Pain?  Yes    Pain Score  7     Pain Location  Shoulder    Pain Orientation  Left                       [facility] Adult PT Treatment/Exercise - 07/12/17 0001      Knee/Hip Exercises: Aerobic   Nustep  level 5 x 7 minutes      Knee/Hip Exercises: Machines for Strengthening   Cybex Knee Extension  5# 2x15    Cybex Knee Flexion  25# 2x15    Cybex Leg Press  20# 2x15      Knee/Hip Exercises: Standing   Walking with Sports Cord  30lb side step x3 each way      Shoulder Exercises: Standing   Extension  20 reps;Theraband    Theraband Level (Shoulder Extension)  Level 2 (Red)    Row  15 reps;Theraband;Both;Strengthening x2     Theraband Level (Shoulder Row)  Level 2 (Red)      Shoulder Exercises: ROM/Strengthening   UBE (Upper Arm Bike)  level 3 x 3 minutes had to stop due to pain       Moist Heat Therapy   Number Minutes Moist Heat  15 Minutes    Moist Heat Location  Shoulder;Hip      Electrical Stimulation   Electrical Stimulation Location  left shoulder and right hip    Electrical Stimulation Action  premod  Electrical Stimulation Parameters  supine    Electrical Stimulation Goals  Pain               PT Short Term Goals - 06/05/17 1057      PT SHORT TERM GOAL #1   Title  independent with initial HEP    Status  Achieved        PT Long Term Goals - 07/12/17 1558      PT LONG TERM GOAL #1   Title  decrease pain 50%    Status  On-going      PT LONG TERM GOAL #2   Title  report 50% less difficulty at work    Status  On-going      PT LONG TERM GOAL #3   Title  increase right shoulder AROM to 140 degrees flexion    Status  On-going            Plan - 07/12/17 1558    Clinical Impression Statement  Pt enters clinic reporting increase shoulder pain due to a fall last night. Had to discontinue UBE to L shoulder pain. Slight progression with LE strength. No UE issues with Tband exercises.      Rehab Potential  Good    PT Frequency  2x / week    PT Duration  8 weeks    PT Treatment/Interventions  ADLs/Self Care Home Management;Cryotherapy;Electrical Stimulation;Iontophoresis 4mg /ml Dexamethasone;Balance training;Therapeutic exercise;Therapeutic activities;Functional mobility  training;Patient/family education;Manual techniques    PT Next Visit Plan  Continue to work on her overall strength and function       Patient will benefit from skilled therapeutic intervention in order to improve the following deficits and impairments:  Decreased range of motion, Difficulty walking, Increased muscle spasms, Pain, Decreased activity tolerance, Decreased balance, Impaired flexibility, Improper body mechanics, Postural dysfunction, Decreased strength, Decreased mobility  Visit Diagnosis: Acute pain of left shoulder  Muscle weakness (generalized)  Other muscle spasm  Pain in right hip  Stiffness of left shoulder, not elsewhere classified     Problem List Patient Active Problem List   Diagnosis Date Noted  . Left shoulder pain 02/18/2017  . MDD (major depressive disorder), recurrent severe, without psychosis ([place]) 12/28/2016  . Muscle strain 09/19/2016  . Right foot injury, subsequent encounter 08/25/2016  . Low back pain 06/27/2016  . Left wrist injury, subsequent encounter 06/27/2016  . Pain of left thumb 10/07/2015  . 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 Strain of left thumb 02/11/2015  . Strain of right forearm 02/11/2015  . Right shoulder pain 12/31/2014  . Injury of right little finger 12/31/2014  . Episodic cluster headache, 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  . 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 Hypersomnia, recurrent 12/02/2014  . Migraine aura, persistent, intractable, with status migrainosus 12/02/2014  . Lower back injury 09/30/2014  . Bipolar I disorder, most recent episode depressed ([place])   . MDD (major depressive disorder), recurrent, severe, with psychosis ([place]) 09/23/2014  . Injury of left index finger 08/20/2014  . Right ankle sprain 06/01/2014  . Contusion, multiple sites 06/01/2014  . Strain of right gastrocnemius muscle 06/01/2014  . Phonophobia 05/04/2014  .  Photophobia of both eyes 05/04/2014  . Emotionally unstable borderline personality disorder ([place]) 05/04/2014  . Nausea with vomiting 05/04/2014  . Mixed bipolar I disorder ([place])   . Bipolar I disorder, most recent episode mixed ([place]) 04/18/2014  . Bipolar affective disorder, depressed, mild ([place]) 04/12/2014  . Suicidal  ideation 04/12/2014  . Injury of right shoulder and upper arm 02/17/2014  . Left leg pain 06/03/2013  . Right hip pain 06/03/2013  . Migraine with status migrainosus 01/08/2013  . Personality disorder ([place])   . Chronic migraine 05/08/2012  . Contact dermatitis 11/27/2011  . Major depressive disorder, recurrent episode ([place]) 10/27/2011  . Generalized anxiety disorder 10/27/2011  . 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 Borderline personality disorder ([place]) 10/27/2011  . Right foot pain 09/28/2011  . 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 Ganglion cyst 09/29/2009  . PES PLANUS 07/01/2008  . BIPOLAR DISORDER UNSPECIFIED 06/09/2008    Scot Jun, PTA 07/12/2017, 4:02 PM  [place], [place], [address] Phone: [phone]   Fax:  [phone]  Name: SURAYA VIDRINE MRN: [id number] Date of Birth: 09-08-1970

## 2017-07-13 ENCOUNTER — Ambulatory Visit: Payer: Self-pay | Admitting: Family Medicine

## 2017-07-16 ENCOUNTER — Ambulatory Visit: Payer: PPO | Admitting: Physical Therapy

## 2017-07-16 ENCOUNTER — Encounter: Payer: Self-pay | Admitting: Physical Therapy

## 2017-07-16 DIAGNOSIS — M62838 Other muscle spasm: Secondary | ICD-10-CM

## 2017-07-16 DIAGNOSIS — M25612 Stiffness of left shoulder, not elsewhere classified: Secondary | ICD-10-CM

## 2017-07-16 DIAGNOSIS — M25512 Pain in left shoulder: Secondary | ICD-10-CM

## 2017-07-16 DIAGNOSIS — M25551 Pain in right hip: Secondary | ICD-10-CM

## 2017-07-16 DIAGNOSIS — M6281 Muscle weakness (generalized): Secondary | ICD-10-CM

## 2017-07-16 NOTE — Therapy (Signed)
[place], [place], [address] Phone: [phone]   Fax:  [phone]  Physical Therapy Treatment  Patient Details  Name: [name] MRN: [id number] Date of Birth: Jun 19, 1970 Referring Provider: Barbaraann Barthel   Encounter Date: 07/16/2017  PT End of Session - 07/16/17 1611    Visit Number  10    Date for PT Re-Evaluation  07/22/17    PT Start Time  6063    PT Stop Time  1620    PT Time Calculation (min)  50 min    Activity Tolerance  Patient tolerated treatment well;Patient limited by fatigue    Behavior During Therapy  [facility] for tasks assessed/performed       Past Medical History:  Diagnosis Date  . Achilles tendinitis   . Achilles tendinitis   . ADHD (attention deficit hyperactivity disorder)   . Allergy   . Arthritis   . Bipolar affective ([place])   . Bipolar disorder ([place])   . Cataracts, bilateral   . Eczema   . Ganglion cyst 09/29/2009   left wrist (2 cyst)  . Hyperprolactinemia ([place])   . Hypertension   . Lipoma   . Migraine   . Personality disorder ([name])   . Pes planus     Past Surgical History:  Procedure Laterality Date  . ANKLE SURGERY  12/88   left   . chest nodule  1990?   rt chest wall nodule removal  . GANGLION CYST EXCISION  2011  . lipoma removal    . right bunioectomy    . SHOULDER SURGERY  01/13/2011   right, partial tear  . tumor resection left thigh      There were no vitals filed for this visit.  Subjective Assessment - 07/16/17 1535    Subjective  Patient reports that she has remained sore from the catching herself from falling last week, reports that she is having some swelling in her feet    Currently in Pain?  Yes    Pain Score  6     Pain Location  Shoulder    Pain Orientation  Left    Aggravating Factors   activities    Pain Relieving Factors  the treatment helps some                       [facility] Adult PT Treatment/Exercise -  07/16/17 0001      Knee/Hip Exercises: Stretches   Passive Hamstring Stretch  3 reps;20 seconds    Piriformis Stretch  3 reps;20 seconds      Knee/Hip Exercises: Aerobic   Nustep  level 5 x 7 minutes      Knee/Hip Exercises: Machines for Strengthening   Cybex Knee Extension  5# 2x15    Cybex Knee Flexion  25# 2x15    Cybex Leg Press  20# 2x15      Knee/Hip Exercises: Standing   Walking with Sports Cord  resisted gait all directions      Shoulder Exercises: ROM/Strengthening   Lat Pull  2 plate;20 reps    Cybex Press  1 plate;20 reps    Cybex Row  2 plate;20 reps      Moist Heat Therapy   Number Minutes Moist Heat  15 Minutes    Moist Heat Location  Shoulder;Hip      Electrical Stimulation   Electrical Stimulation Location  left shoulder and right hip    Electrical Stimulation Action  premod    Electrical Stimulation Parameters  supine    Electrical Stimulation Goals  Pain               PT Short Term Goals - 06/05/17 1057      PT SHORT TERM GOAL #1   Title  independent with initial HEP    Status  Achieved        PT Long Term Goals - 07/12/17 1558      PT LONG TERM GOAL #1   Title  decrease pain 50%    Status  On-going      PT LONG TERM GOAL #2   Title  report 50% less difficulty at work    Status  On-going      PT LONG TERM GOAL #3   Title  increase right shoulder AROM to 140 degrees flexion    Status  On-going            Plan - 07/16/17 1612    Clinical Impression Statement  Patient reports not feeling well emotionally, she reports some stress.  She moved well today but was a little more lethargic and run down.  Reports that he LE stretches do seem to help the pain    PT Next Visit Plan  Continue to work on her overall strength and function    Consulted and Agree with Plan of Care  Patient       Patient will benefit from skilled therapeutic intervention in order to improve the following deficits and impairments:  Decreased range of  motion, Difficulty walking, Increased muscle spasms, Pain, Decreased activity tolerance, Decreased balance, Impaired flexibility, Improper body mechanics, Postural dysfunction, Decreased strength, Decreased mobility  Visit Diagnosis: Acute pain of left shoulder  Muscle weakness (generalized)  Other muscle spasm  Pain in right hip  Stiffness of left shoulder, not elsewhere classified     Problem List Patient Active Problem List   Diagnosis Date Noted  . Left shoulder pain 02/18/2017  . MDD (major depressive disorder), recurrent severe, without psychosis ([place]) 12/28/2016  . Muscle strain 09/19/2016  . Right foot injury, subsequent encounter 08/25/2016  . Low back pain 06/27/2016  . Left wrist injury, subsequent encounter 06/27/2016  . Pain of left thumb 10/07/2015  . 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 Strain of left thumb 02/11/2015  . Strain of right forearm 02/11/2015  . Right shoulder pain 12/31/2014  . Injury of right little finger 12/31/2014  . Episodic cluster headache, 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  . 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 Hypersomnia, recurrent 12/02/2014  . Migraine aura, persistent, intractable, with status migrainosus 12/02/2014  . Lower back injury 09/30/2014  . Bipolar I disorder, most recent episode depressed ([place])   . MDD (major depressive disorder), recurrent, severe, with psychosis ([place]) 09/23/2014  . Injury of left index finger 08/20/2014  . Right ankle sprain 06/01/2014  . Contusion, multiple sites 06/01/2014  . Strain of right gastrocnemius muscle 06/01/2014  . Phonophobia 05/04/2014  . Photophobia of both eyes 05/04/2014  . Emotionally unstable borderline personality disorder ([place]) 05/04/2014  . Nausea with vomiting 05/04/2014  . Mixed bipolar I disorder ([place])   . Bipolar I disorder, most recent episode mixed ([place]) 04/18/2014  . Bipolar affective disorder,  depressed, mild ([place]) 04/12/2014  . Suicidal ideation 04/12/2014  . Injury of right shoulder and upper arm 02/17/2014  . Left leg pain 06/03/2013  . Right hip pain 06/03/2013  .  Migraine with status migrainosus 01/08/2013  . Personality disorder ([place])   . Chronic migraine 05/08/2012  . Contact dermatitis 11/27/2011  . Major depressive disorder, recurrent episode ([place]) 10/27/2011  . Generalized anxiety disorder 10/27/2011  . 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 Borderline personality disorder ([place]) 10/27/2011  . Right foot pain 09/28/2011  . 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 Ganglion cyst 09/29/2009  . PES PLANUS 07/01/2008  . BIPOLAR DISORDER UNSPECIFIED 06/09/2008    Sumner Boast., PT 07/16/2017, 4:14 PM  [place], [place], [address] Phone: [phone]   Fax:  [phone]  Name: RUTHIA PERSON MRN: [id number] Date of Birth: [birth year]

## 2017-07-17 ENCOUNTER — Other Ambulatory Visit (HOSPITAL_COMMUNITY)
Admission: RE | Admit: 2017-07-17 | Discharge: 2017-07-17 | Disposition: A | Discharge status: Home / Self Care | Payer: PPO | Source: Ambulatory Visit | Attending: Family Medicine | Admitting: Family Medicine

## 2017-07-17 ENCOUNTER — Other Ambulatory Visit: Payer: Self-pay | Admitting: Family Medicine

## 2017-07-17 DIAGNOSIS — Z124 Encounter for screening for malignant neoplasm of cervix: Secondary | ICD-10-CM | POA: Insufficient documentation

## 2017-07-17 DIAGNOSIS — I10 Essential (primary) hypertension: Secondary | ICD-10-CM | POA: Diagnosis not present

## 2017-07-17 DIAGNOSIS — F319 Bipolar disorder, unspecified: Secondary | ICD-10-CM | POA: Diagnosis not present

## 2017-07-17 DIAGNOSIS — Z0000 Encounter for general adult medical examination without abnormal findings: Secondary | ICD-10-CM | POA: Diagnosis not present

## 2017-07-18 ENCOUNTER — Ambulatory Visit: Payer: PPO | Admitting: Physical Therapy

## 2017-07-19 LAB — CYTOLOGY - PAP
Adequacy: ABSENT
Diagnosis: NEGATIVE
HPV: NOT DETECTED

## 2017-07-23 ENCOUNTER — Ambulatory Visit: Payer: PPO | Admitting: Physical Therapy

## 2017-07-26 ENCOUNTER — Encounter: Payer: Self-pay | Admitting: Physical Therapy

## 2017-07-26 ENCOUNTER — Ambulatory Visit: Payer: PPO | Admitting: Physical Therapy

## 2017-07-26 DIAGNOSIS — M25512 Pain in left shoulder: Secondary | ICD-10-CM

## 2017-07-26 DIAGNOSIS — M6281 Muscle weakness (generalized): Secondary | ICD-10-CM

## 2017-07-26 DIAGNOSIS — M25612 Stiffness of left shoulder, not elsewhere classified: Secondary | ICD-10-CM

## 2017-07-26 DIAGNOSIS — M62838 Other muscle spasm: Secondary | ICD-10-CM

## 2017-07-26 DIAGNOSIS — M25551 Pain in right hip: Secondary | ICD-10-CM

## 2017-07-26 NOTE — Therapy (Signed)
[place], [place], [address] Phone: [phone]   Fax:  [phone]  Physical Therapy Treatment  Patient Details  Name: [name] MRN: [id number] Date of Birth: 10/23/1970 Referring Provider: Barbaraann Barthel   Encounter Date: 07/26/2017  PT End of Session - 07/26/17 0932    Visit Number  11    PT Start Time  0845    PT Stop Time  6599    PT Time Calculation (min)  62 min    Activity Tolerance  Patient tolerated treatment well    Behavior During Therapy  [facility] for tasks assessed/performed       Past Medical History:  Diagnosis Date  . Achilles tendinitis   . Achilles tendinitis   . ADHD (attention deficit hyperactivity disorder)   . Allergy   . Arthritis   . Bipolar affective ([place])   . Bipolar disorder ([place])   . Cataracts, bilateral   . Eczema   . Ganglion cyst 09/29/2009   left wrist (2 cyst)  . Hyperprolactinemia ([place])   . Hypertension   . Lipoma   . Migraine   . Personality disorder ([place])   . Pes planus     Past Surgical History:  Procedure Laterality Date  . ANKLE SURGERY  12/88   left   . chest nodule  1990?   rt chest wall nodule removal  . GANGLION CYST EXCISION  2011  . lipoma removal    . right bunioectomy    . SHOULDER SURGERY  01/13/2011   right, partial tear  . tumor resection left thigh      There were no vitals filed for this visit.  Subjective Assessment - 07/26/17 0850    Subjective  "Not too bad"    Currently in Pain?  Yes    Pain Score  2     Pain Location  Shoulder    Pain Orientation  Right                       [facility] Adult PT Treatment/Exercise - 07/26/17 0001      Knee/Hip Exercises: Stretches   Passive Hamstring Stretch  Left;Right;5 reps;10 seconds    Piriformis Stretch  3 reps;10 seconds    Other Knee/Hip Stretches  single K2C 3x10 sex      Knee/Hip Exercises: Aerobic   Nustep  level 5 x 7 minutes      Knee/Hip  Exercises: Machines for Strengthening   Cybex Knee Extension  10lb 2x10    Cybex Knee Flexion  35lb 2x10    Cybex Leg Press  40# 2x10      Knee/Hip Exercises: Standing   Walking with Sports Cord  40lb side step x3 each way      Shoulder Exercises: ROM/Strengthening   UBE (Upper Arm Bike)  level 3 x 6 minutes    Lat Pull  2 plate;20 reps    Cybex Press  1 plate;20 reps    Cybex Row  2 plate;20 reps      Moist Heat Therapy   Number Minutes Moist Heat  15 Minutes    Moist Heat Location  Shoulder;Hip      Electrical Stimulation   Electrical Stimulation Location  left shoulder and right hip    Electrical Stimulation Action  pre mod    Electrical Stimulation Parameters  supine    Electrical Stimulation Goals  Pain  PT Short Term Goals - 06/05/17 1057      PT SHORT TERM GOAL #1   Title  independent with initial HEP    Status  Achieved        PT Long Term Goals - 07/12/17 1558      PT LONG TERM GOAL #1   Title  decrease pain 50%    Status  On-going      PT LONG TERM GOAL #2   Title  report 50% less difficulty at work    Status  On-going      PT LONG TERM GOAL #3   Title  increase right shoulder AROM to 140 degrees flexion    Status  On-going            Plan - 07/26/17 0933    Clinical Impression Statement Pt did well with today's interventions. She tolerated some exercises with increase resistance. Positive response to LE stretching to help with pain.     Rehab Potential  Good    PT Frequency  2x / week    PT Duration  8 weeks    PT Treatment/Interventions  ADLs/Self Care Home Management;Cryotherapy;Electrical Stimulation;Iontophoresis 4mg /ml Dexamethasone;Balance training;Therapeutic exercise;Therapeutic activities;Functional mobility training;Patient/family education;Manual techniques    PT Next Visit Plan  Continue to work on her overall strength and function       Patient will benefit from skilled therapeutic intervention in order to  improve the following deficits and impairments:  Decreased range of motion, Difficulty walking, Increased muscle spasms, Pain, Decreased activity tolerance, Decreased balance, Impaired flexibility, Improper body mechanics, Postural dysfunction, Decreased strength, Decreased mobility  Visit Diagnosis: Acute pain of left shoulder  Muscle weakness (generalized)  Other muscle spasm  Pain in right hip  Stiffness of left shoulder, not elsewhere classified     Problem List Patient Active Problem List   Diagnosis Date Noted  . Left shoulder pain 02/18/2017  . MDD (major depressive disorder), recurrent severe, without psychosis ([place]) 12/28/2016  . Muscle strain 09/19/2016  . Right foot injury, subsequent encounter 08/25/2016  . Low back pain 06/27/2016  . Left wrist injury, subsequent encounter 06/27/2016  . Pain of left thumb 10/07/2015  . 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 Strain of left thumb 02/11/2015  . Strain of right forearm 02/11/2015  . Right shoulder pain 12/31/2014  . Injury of right little finger 12/31/2014  . Episodic cluster headache, 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  . 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 Hypersomnia, recurrent 12/02/2014  . Migraine aura, persistent, intractable, with status migrainosus 12/02/2014  . Lower back injury 09/30/2014  . Bipolar I disorder, most recent episode depressed ([place])   . MDD (major depressive disorder), recurrent, severe, with psychosis ([place]) 09/23/2014  . Injury of left index finger 08/20/2014  . Right ankle sprain 06/01/2014  . Contusion, multiple sites 06/01/2014  . Strain of right gastrocnemius muscle 06/01/2014  . Phonophobia 05/04/2014  . Photophobia of both eyes 05/04/2014  . Emotionally unstable borderline personality disorder ([place]) 05/04/2014  . Nausea with vomiting 05/04/2014  . Mixed bipolar I disorder ([place])   . Bipolar I disorder, most recent  episode mixed ([place]) 04/18/2014  . Bipolar affective disorder, depressed, mild ([place]) 04/12/2014  . Suicidal ideation 04/12/2014  . Injury of right shoulder and upper arm 02/17/2014  . Left leg pain 06/03/2013  . Right hip pain 06/03/2013  . Migraine with status migrainosus 01/08/2013  . Personality disorder ([place])   .  Chronic migraine 05/08/2012  . Contact dermatitis 11/27/2011  . Major depressive disorder, recurrent episode ([place]) 10/27/2011  . Generalized anxiety disorder 10/27/2011  . 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 Borderline personality disorder ([place]) 10/27/2011  . Right foot pain 09/28/2011  . 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 Ganglion cyst 09/29/2009  . PES PLANUS 07/01/2008  . BIPOLAR DISORDER UNSPECIFIED 06/09/2008    Scot Jun 07/26/2017, 9:35 AM  [place] [address] [place], [place], [address] Phone: [phone]   Fax:  [phone]  Name: [name] MRN: [id number] Date of Birth: 11-27-1970

## 2017-08-06 ENCOUNTER — Ambulatory Visit: Payer: PPO | Attending: Family Medicine | Admitting: Physical Therapy

## 2017-08-06 ENCOUNTER — Encounter: Payer: Self-pay | Admitting: Physical Therapy

## 2017-08-06 DIAGNOSIS — M25551 Pain in right hip: Secondary | ICD-10-CM | POA: Insufficient documentation

## 2017-08-06 DIAGNOSIS — M62838 Other muscle spasm: Secondary | ICD-10-CM

## 2017-08-06 DIAGNOSIS — M25612 Stiffness of left shoulder, not elsewhere classified: Secondary | ICD-10-CM | POA: Diagnosis not present

## 2017-08-06 DIAGNOSIS — M25512 Pain in left shoulder: Secondary | ICD-10-CM

## 2017-08-06 DIAGNOSIS — M6281 Muscle weakness (generalized): Secondary | ICD-10-CM | POA: Diagnosis not present

## 2017-08-06 NOTE — Therapy (Signed)
[place], [place], [address] Phone: [phone]   Fax:  [phone]  Physical Therapy Treatment  Patient Details  Name: [name] MRN: [id number] Date of Birth: 06-19-1970 Referring Provider: Barbaraann Barthel   Encounter Date: 08/06/2017  PT End of Session - 08/06/17 0943    Visit Number  12    Date for PT Re-Evaluation  08/22/17    PT Start Time  0845    PT Stop Time  0953    PT Time Calculation (min)  68 min    Activity Tolerance  Patient tolerated treatment well    Behavior During Therapy  [facility] for tasks assessed/performed       Past Medical History:  Diagnosis Date  . Achilles tendinitis   . Achilles tendinitis   . ADHD (attention deficit hyperactivity disorder)   . Allergy   . Arthritis   . Bipolar affective ([place])   . Bipolar disorder ([place])   . Cataracts, bilateral   . Eczema   . Ganglion cyst 09/29/2009   left wrist (2 cyst)  . Hyperprolactinemia ([place])   . Hypertension   . Lipoma   . Migraine   . Personality disorder ([place])   . Pes planus     Past Surgical History:  Procedure Laterality Date  . ANKLE SURGERY  12/88   left   . chest nodule  1990?   rt chest wall nodule removal  . GANGLION CYST EXCISION  2011  . lipoma removal    . right bunioectomy    . SHOULDER SURGERY  01/13/2011   right, partial tear  . tumor resection left thigh      There were no vitals filed for this visit.  Subjective Assessment - 08/06/17 0847    Subjective  Pt reports falling on a metal ladder hitter her R hip Friday. The also reports falling on the floor hitting her L hip    Currently in Pain?  Yes    Pain Score  5     Pain Location  Hip    Pain Orientation  Left;Right    Pain Descriptors / Indicators  Aching         [facility] PT Assessment - 08/06/17 0001      AROM   Left Shoulder Flexion  126 Degrees                   [facility] Adult PT Treatment/Exercise - 08/06/17 0001       Ambulation/Gait   Gait Comments  one flight of stairs then back down outside up hill. Standing rest break after one flight, then seated rest before going up hill       Knee/Hip Exercises: Stretches   Passive Hamstring Stretch  Left;Right;5 reps;10 seconds    Piriformis Stretch  3 reps;10 seconds    Other Knee/Hip Stretches  single K2C 3x10 sex      Knee/Hip Exercises: Aerobic   Nustep  level 5 x 7 minutes      Knee/Hip Exercises: Machines for Strengthening   Cybex Knee Extension  10lb 2x10    Cybex Knee Flexion  35lb 2x10      Shoulder Exercises: Standing   Other Standing Exercises  shoulder flex and ext with cane 2x10 each       Shoulder Exercises: ROM/Strengthening   UBE (Upper Arm Bike)  level 3 x 6 minutes    Lat Pull  2 plate;20 reps    Cybex  Row  2 plate;20 reps      Moist Heat Therapy   Number Minutes Moist Heat  15 Minutes    Moist Heat Location  Shoulder;Hip      Electrical Stimulation   Electrical Stimulation Location  left shoulder and right hip    Electrical Stimulation Action  pre mod    Electrical Stimulation Parameters  supine    Electrical Stimulation Goals  Pain               PT Short Term Goals - 06/05/17 1057      PT SHORT TERM GOAL #1   Title  independent with initial HEP    Status  Achieved        PT Long Term Goals - 08/06/17 0918      PT LONG TERM GOAL #3   Title  increase right shoulder AROM to 140 degrees flexion    Status  Partially Met            Plan - 08/06/17 0943    Clinical Impression Statement  Fatigue noted with up hill ambulation. Pt has progressed towards all LTG's increasing her L shoulder AROM. She does report some L hip pain with External rotation during stretching that's she thinks is due to her fall.     Rehab Potential  Good    PT Frequency  2x / week    PT Duration  8 weeks    PT Next Visit Plan  Continue to work on her overall strength and function       Patient will benefit from skilled  therapeutic intervention in order to improve the following deficits and impairments:  Decreased range of motion, Difficulty walking, Increased muscle spasms, Pain, Decreased activity tolerance, Decreased balance, Impaired flexibility, Improper body mechanics, Postural dysfunction, Decreased strength, Decreased mobility  Visit Diagnosis: Acute pain of left shoulder  Muscle weakness (generalized)  Other muscle spasm  Pain in right hip  Stiffness of left shoulder, not elsewhere classified     Problem List Patient Active Problem List   Diagnosis Date Noted  . Left shoulder pain 02/18/2017  . MDD (major depressive disorder), recurrent severe, without psychosis ([place]) 12/28/2016  . Muscle strain 09/19/2016  . Right foot injury, subsequent encounter 08/25/2016  . Low back pain 06/27/2016  . Left wrist injury, subsequent encounter 06/27/2016  . Pain of left thumb 10/07/2015  . 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 Strain of left thumb 02/11/2015  . Strain of right forearm 02/11/2015  . Right shoulder pain 12/31/2014  . Injury of right little finger 12/31/2014  . Episodic cluster headache, 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  . 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 Hypersomnia, recurrent 12/02/2014  . Migraine aura, persistent, intractable, with status migrainosus 12/02/2014  . Lower back injury 09/30/2014  . Bipolar I disorder, most recent episode depressed ([place])   . MDD (major depressive disorder), recurrent, severe, with psychosis ([place]) 09/23/2014  . Injury of left index finger 08/20/2014  . Right ankle sprain 06/01/2014  . Contusion, multiple sites 06/01/2014  . Strain of right gastrocnemius muscle 06/01/2014  . Phonophobia 05/04/2014  . Photophobia of both eyes 05/04/2014  . Emotionally unstable borderline personality disorder ([place]) 05/04/2014  . Nausea with vomiting 05/04/2014  . Mixed bipolar I disorder ([place])    . Bipolar I disorder, most recent episode mixed ([place]) 04/18/2014  . Bipolar affective disorder, depressed, mild ([place]) 04/12/2014  . Suicidal ideation 04/12/2014  .  Injury of right shoulder and upper arm 02/17/2014  . Left leg pain 06/03/2013  . Right hip pain 06/03/2013  . Migraine with status migrainosus 01/08/2013  . Personality disorder ([place])   . Chronic migraine 05/08/2012  . Contact dermatitis 11/27/2011  . Major depressive disorder, recurrent episode ([place]) 10/27/2011  . Generalized anxiety disorder 10/27/2011  . 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 Borderline personality disorder ([place]) 10/27/2011  . Right foot pain 09/28/2011  . 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 Ganglion cyst 09/29/2009  . PES PLANUS 07/01/2008  . BIPOLAR DISORDER UNSPECIFIED 06/09/2008    Scot Jun, PTA 08/06/2017, 9:45 AM  [place], [place], [address] Phone: [phone]   Fax:  [phone]  Name: NAILEAH KARG MRN: [id number] Date of Birth: [birth year]

## 2017-08-07 DIAGNOSIS — L255 Unspecified contact dermatitis due to plants, except food: Secondary | ICD-10-CM | POA: Diagnosis not present

## 2017-08-09 ENCOUNTER — Ambulatory Visit: Payer: PPO | Admitting: Physical Therapy

## 2017-08-13 ENCOUNTER — Ambulatory Visit: Payer: PPO | Admitting: Physical Therapy

## 2017-08-13 ENCOUNTER — Ambulatory Visit: Payer: Self-pay | Admitting: Family Medicine

## 2017-08-15 ENCOUNTER — Encounter: Payer: Self-pay | Admitting: Physical Therapy

## 2017-08-15 ENCOUNTER — Ambulatory Visit: Payer: PPO | Admitting: Physical Therapy

## 2017-08-15 DIAGNOSIS — M62838 Other muscle spasm: Secondary | ICD-10-CM

## 2017-08-15 DIAGNOSIS — M6281 Muscle weakness (generalized): Secondary | ICD-10-CM

## 2017-08-15 DIAGNOSIS — M25512 Pain in left shoulder: Secondary | ICD-10-CM

## 2017-08-15 NOTE — Therapy (Signed)
[place], [place], [address] Phone: [phone]   Fax:  [phone]  Physical Therapy Treatment  Patient Details  Name: [name] MRN: [id number] Date of Birth: [birth year] Referring Provider: Barbaraann Barthel   Encounter Date: 08/15/2017  PT End of Session - 08/15/17 1112    Visit Number  13    Date for PT Re-Evaluation  08/22/17    PT Start Time  1017    PT Stop Time  1124    PT Time Calculation (min)  67 min    Activity Tolerance  Patient tolerated treatment well    Behavior During Therapy  [facility] for tasks assessed/performed       Past Medical History:  Diagnosis Date  . Achilles tendinitis   . Achilles tendinitis   . ADHD (attention deficit hyperactivity disorder)   . Allergy   . Arthritis   . Bipolar affective ([place])   . Bipolar disorder ([place])   . Cataracts, bilateral   . Eczema   . Ganglion cyst 09/29/2009   left wrist (2 cyst)  . Hyperprolactinemia ([place])   . Hypertension   . Lipoma   . Migraine   . Personality disorder ([place])   . Pes planus     Past Surgical History:  Procedure Laterality Date  . ANKLE SURGERY  12/88   left   . chest nodule  1990?   rt chest wall nodule removal  . GANGLION CYST EXCISION  2011  . lipoma removal    . right bunioectomy    . SHOULDER SURGERY  01/13/2011   right, partial tear  . tumor resection left thigh      There were no vitals filed for this visit.  Subjective Assessment - 08/15/17 1020    Subjective  "My chest hurts" Pt stated that it happens randomly     Currently in Pain?  Yes    Pain Score  6     Pain Location  Chest                       [facility] Adult PT Treatment/Exercise - 08/15/17 0001      Ambulation/Gait   Gait Comments  one flight of stairs then back down outside up hill. Standing rest break after one flight, then seated rest before going up hill       Knee/Hip Exercises: Stretches   Passive Hamstring  Stretch  Left;Right;5 reps;10 seconds    Piriformis Stretch  3 reps;10 seconds    Other Knee/Hip Stretches  single K2C 3x10 sex      Knee/Hip Exercises: Aerobic   Nustep  level 5 x 7 minutes      Knee/Hip Exercises: Machines for Strengthening   Cybex Leg Press  40# 2x15      Shoulder Exercises: Standing   Other Standing Exercises  shoulder flex and ext with cane 2x10 each       Shoulder Exercises: ROM/Strengthening   UBE (Upper Arm Bike)  level 3 x 6 minutes    Lat Pull  20 reps;2.5 plate    Cybex Row  20 reps;2.5 plate;3 plate      Moist Heat Therapy   Number Minutes Moist Heat  15 Minutes    Moist Heat Location  Shoulder;Hip      Electrical Stimulation   Electrical Stimulation Location  left shoulder and right hip    Electrical Stimulation Action  premod     Electrical  Stimulation Parameters  supine     Electrical Stimulation Goals  Pain               PT Short Term Goals - 06/05/17 1057      PT SHORT TERM GOAL #1   Title  independent with initial HEP    Status  Achieved        PT Long Term Goals - 08/06/17 0918      PT LONG TERM GOAL #3   Title  increase right shoulder AROM to 140 degrees flexion    Status  Partially Met            Plan - 08/15/17 1114    Clinical Impression Statement  Pt appears to be improving overall. Less fatigue noted with functional activities, Tolerated increase resistance with scapular stabe interventions. Good bilat LE flexibility noted with passive stretching.     Rehab Potential  Good    PT Frequency  2x / week    PT Duration  8 weeks    PT Treatment/Interventions  ADLs/Self Care Home Management;Cryotherapy;Electrical Stimulation;Iontophoresis 99m/ml Dexamethasone;Balance training;Therapeutic exercise;Therapeutic activities;Functional mobility training;Patient/family education;Manual techniques    PT Next Visit Plan  Continue to work on her overall strength and function       Patient will benefit from skilled  therapeutic intervention in order to improve the following deficits and impairments:  Decreased range of motion, Difficulty walking, Increased muscle spasms, Pain, Decreased activity tolerance, Decreased balance, Impaired flexibility, Improper body mechanics, Postural dysfunction, Decreased strength, Decreased mobility  Visit Diagnosis: Acute pain of left shoulder  Muscle weakness (generalized)  Other muscle spasm     Problem List Patient Active Problem List   Diagnosis Date Noted  . Left shoulder pain 02/18/2017  . MDD (major depressive disorder), recurrent severe, without psychosis ([place] 12/28/2016  . Muscle strain 09/19/2016  . Right foot injury, subsequent encounter 08/25/2016  . Low back pain 06/27/2016  . Left wrist injury, subsequent encounter 06/27/2016  . Pain of left thumb 10/07/2015  . 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 Strain of left thumb 02/11/2015  . Strain of right forearm 02/11/2015  . Right shoulder pain 12/31/2014  . Injury of right little finger 12/31/2014  . Episodic cluster headache, 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  . 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 Hypersomnia, recurrent 12/02/2014  . Migraine aura, persistent, intractable, with status migrainosus 12/02/2014  . Lower back injury 09/30/2014  . Bipolar I disorder, most recent episode depressed ([place]   . MDD (major depressive disorder), recurrent, severe, with psychosis ([place] 09/23/2014  . Injury of left index finger 08/20/2014  . Right ankle sprain 06/01/2014  . Contusion, multiple sites 06/01/2014  . Strain of right gastrocnemius muscle 06/01/2014  . Phonophobia 05/04/2014  . Photophobia of both eyes 05/04/2014  . Emotionally unstable borderline personality disorder ([place] 05/04/2014  . Nausea with vomiting 05/04/2014  . Mixed bipolar I disorder ([place]   . Bipolar I disorder, most recent episode mixed ([place] 04/18/2014  .  Bipolar affective disorder, depressed, mild ([place] 04/12/2014  . Suicidal ideation 04/12/2014  . Injury of right shoulder and upper arm 02/17/2014  . Left leg pain 06/03/2013  . Right hip pain 06/03/2013  . Migraine with status migrainosus 01/08/2013  . Personality disorder ([place]   . Chronic migraine 05/08/2012  . Contact dermatitis 11/27/2011  . Major depressive disorder, recurrent episode ([place] 10/27/2011  . Generalized anxiety disorder 10/27/2011  . ADHD (attention deficit  hyperactivity disorder), 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  . Borderline personality disorder ([place]) 10/27/2011  . Right foot pain 09/28/2011  . 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 Ganglion cyst 09/29/2009  . PES PLANUS 07/01/2008  . BIPOLAR DISORDER UNSPECIFIED 06/09/2008    Scot Jun, PTA 08/15/2017, 11:16 AM  [place], [place], [address] Phone: [phone]   Fax:  [phone]  Name: CHELSI WARR MRN: [id number] Date of Birth: 1970/03/20

## 2017-08-22 ENCOUNTER — Ambulatory Visit: Payer: PPO | Admitting: Physical Therapy

## 2017-08-22 ENCOUNTER — Telehealth: Payer: Self-pay | Admitting: Adult Health

## 2017-08-22 NOTE — Telephone Encounter (Signed)
Pt requesting a call, stating she has been having more frequent migraines. Stating botox didn't;t work and would like to discuss starting [place]. Please call to advise

## 2017-08-22 NOTE — Telephone Encounter (Signed)
Called patient who stated she is having  more migraines. Her psychiatrist increased Lamictal and Latuda. Her mother has been diagnosed with cancer which has added stress to patient's life.  She has failed Botox in the past. She saw Aimovig on TV and would like to know if, with her other medications can she try Aimovig. She also stated she would like to see Dr Brett Fairy. This RN explained that many patients will see NP intermittently because the physicians must also have appointments to see new patients. She stated she still thought she should see Dr Brett Fairy more often. This RN stated will send her request to NP and also to Dr Dohmeier's RN for her information. Advised will call her back with NP's reply about medication. She verbalized understanding, appreciation.

## 2017-08-23 ENCOUNTER — Encounter: Payer: Self-pay | Admitting: Physical Therapy

## 2017-08-23 ENCOUNTER — Ambulatory Visit: Payer: PPO | Admitting: Physical Therapy

## 2017-08-23 DIAGNOSIS — M6281 Muscle weakness (generalized): Secondary | ICD-10-CM

## 2017-08-23 DIAGNOSIS — M62838 Other muscle spasm: Secondary | ICD-10-CM

## 2017-08-23 DIAGNOSIS — M25551 Pain in right hip: Secondary | ICD-10-CM

## 2017-08-23 DIAGNOSIS — M25512 Pain in left shoulder: Secondary | ICD-10-CM

## 2017-08-23 NOTE — Therapy (Signed)
[name], [place], [address] Phone: [phone]   Fax:  [phone]  Physical Therapy Treatment  Patient Details  Name: [name] MRN: [id number] Date of Birth: 07/07/1970 Referring Provider: Barbaraann Barthel   Encounter Date: 08/23/2017  PT End of Session - 08/23/17 1227    Visit Number  15    Date for PT Re-Evaluation  08/22/17    PT Start Time  1145    PT Stop Time  6701    PT Time Calculation (min)  50 min    Activity Tolerance  Patient tolerated treatment well    Behavior During Therapy  [facility] for tasks assessed/performed;Flat affect       Past Medical History:  Diagnosis Date  . Achilles tendinitis   . Achilles tendinitis   . ADHD (attention deficit hyperactivity disorder)   . Allergy   . Arthritis   . Bipolar affective ([place])   . Bipolar disorder ([place])   . Cataracts, bilateral   . Eczema   . Ganglion cyst 09/29/2009   left wrist (2 cyst)  . Hyperprolactinemia ([place])   . Hypertension   . Lipoma   . Migraine   . Personality disorder ([place])   . Pes planus     Past Surgical History:  Procedure Laterality Date  . ANKLE SURGERY  12/88   left   . chest nodule  1990?   rt chest wall nodule removal  . GANGLION CYST EXCISION  2011  . lipoma removal    . right bunioectomy    . SHOULDER SURGERY  01/13/2011   right, partial tear  . tumor resection left thigh      There were no vitals filed for this visit.  Subjective Assessment - 08/23/17 1145    Subjective  "Shoulder is doing really good" "His is 70% out of 100"    Currently in Pain?  No/denies    Pain Score  0-No pain                       [facility] Adult PT Treatment/Exercise - 08/23/17 0001      Knee/Hip Exercises: Aerobic   Nustep  level 4 x 7 minutes      Knee/Hip Exercises: Machines for Strengthening   Cybex Knee Extension  10lb 2x10    Cybex Knee Flexion  35lb 2x10    Cybex Leg Press  40# 2x15      Shoulder Exercises: ROM/Strengthening   UBE (Upper Arm Bike)  level 3 x 6 minutes    Lat Pull  20 reps;2.5 plate    Cybex Row  20 reps;2.5 plate;3 plate      Moist Heat Therapy   Number Minutes Moist Heat  10 Minutes    Moist Heat Location  Shoulder;Hip      Electrical Stimulation   Electrical Stimulation Location  left shoulder and right hip    Electrical Stimulation Action  pre mod    Electrical Stimulation Parameters  supine    Electrical Stimulation Goals  Pain               PT Short Term Goals - 06/05/17 1057      PT SHORT TERM GOAL #1   Title  independent with initial HEP    Status  Achieved        PT Long Term Goals - 08/06/17 0918      PT LONG TERM GOAL #3  Title  increase right shoulder AROM to 140 degrees flexion    Status  Partially Met            Plan - 08/23/17 1228    Clinical Impression Statement  Pt enters with a flat effect, reports being stressed because her job is over working her and not following MD's recommended hours. Physically pt is doing well no reports of pain with today's activities. Stated that her shoulder is doing really well.     Rehab Potential  Good    PT Frequency  2x / week    PT Duration  8 weeks    PT Treatment/Interventions  ADLs/Self Care Home Management;Cryotherapy;Electrical Stimulation;Iontophoresis 28m/ml Dexamethasone;Balance training;Therapeutic exercise;Therapeutic activities;Functional mobility training;Patient/family education;Manual techniques    PT Next Visit Plan  Continue to work on her overall strength and function       Patient will benefit from skilled therapeutic intervention in order to improve the following deficits and impairments:  Decreased range of motion, Difficulty walking, Increased muscle spasms, Pain, Decreased activity tolerance, Decreased balance, Impaired flexibility, Improper body mechanics, Postural dysfunction, Decreased strength, Decreased mobility  Visit Diagnosis: Muscle weakness  (generalized)  Acute pain of left shoulder  Other muscle spasm  Pain in right hip     Problem List Patient Active Problem List   Diagnosis Date Noted  . Left shoulder pain 02/18/2017  . MDD (major depressive disorder), recurrent severe, without psychosis ([place] 12/28/2016  . Muscle strain 09/19/2016  . Right foot injury, subsequent encounter 08/25/2016  . Low back pain 06/27/2016  . Left wrist injury, subsequent encounter 06/27/2016  . Pain of left thumb 10/07/2015  . 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 Strain of left thumb 02/11/2015  . Strain of right forearm 02/11/2015  . Right shoulder pain 12/31/2014  . Injury of right little finger 12/31/2014  . Episodic cluster headache, 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  . 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 Hypersomnia, recurrent 12/02/2014  . Migraine aura, persistent, intractable, with status migrainosus 12/02/2014  . Lower back injury 09/30/2014  . Bipolar I disorder, most recent episode depressed ([place]   . MDD (major depressive disorder), recurrent, severe, with psychosis ([place] 09/23/2014  . Injury of left index finger 08/20/2014  . Right ankle sprain 06/01/2014  . Contusion, multiple sites 06/01/2014  . Strain of right gastrocnemius muscle 06/01/2014  . Phonophobia 05/04/2014  . Photophobia of both eyes 05/04/2014  . Emotionally unstable borderline personality disorder ([place] 05/04/2014  . Nausea with vomiting 05/04/2014  . Mixed bipolar I disorder ([place]   . Bipolar I disorder, most recent episode mixed ([place] 04/18/2014  . Bipolar affective disorder, depressed, mild ([place] 04/12/2014  . Suicidal ideation 04/12/2014  . Injury of right shoulder and upper arm 02/17/2014  . Left leg pain 06/03/2013  . Right hip pain 06/03/2013  . Migraine with status migrainosus 01/08/2013  . Personality disorder ([place]   . Chronic migraine 05/08/2012  . Contact dermatitis  11/27/2011  . Major depressive disorder, recurrent episode ([place] 10/27/2011  . Generalized anxiety disorder 10/27/2011  . 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 Borderline personality disorder ([place] 10/27/2011  . Right foot pain 09/28/2011  . 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 Malignant tumor of muscle ([facility] 09/02/2010  . Ganglion cyst 09/29/2009  . PES PLANUS 07/01/2008  . BIPOLAR DISORDER UNSPECIFIED 06/09/2008    RScot Jun PTA 08/23/2017, 12:32 PM  [facility]  [place] [address] [place], [place], [address] Phone: [phone]   Fax:  [phone]  Name: [name] MRN: [id number] Date of Birth: October 17, 1970

## 2017-08-23 NOTE — Telephone Encounter (Signed)
Called, LVM for pt to call and discuss

## 2017-08-23 NOTE — Telephone Encounter (Signed)
I am amenable to her trying the medication however because of her insurance she will not qualify for the discount program.  However she can go to the website as they have shared solutions which she can fill out paperwork to see if she can get patient assistance.  If she wants to proceed please let me know.

## 2017-08-23 NOTE — Telephone Encounter (Addendum)
Spoke with patient and relayed NP's message. She stated that she didn't;t think she would qualify for PA. This RN advised she should try anyway. Gave her [employer] website address and phone number. She stated she will call to apply for PA. This RN advised she let us know what she finds out. She verbalized understanding, appreciation.

## 2017-08-28 ENCOUNTER — Ambulatory Visit (INDEPENDENT_AMBULATORY_CARE_PROVIDER_SITE_OTHER): Payer: PPO | Admitting: Family Medicine

## 2017-08-28 ENCOUNTER — Encounter: Payer: Self-pay | Admitting: Family Medicine

## 2017-08-28 VITALS — BP 112/75 | HR 62 | Ht 70.0 in | Wt 260.0 lb

## 2017-08-28 DIAGNOSIS — M25551 Pain in right hip: Secondary | ICD-10-CM

## 2017-08-28 DIAGNOSIS — M25512 Pain in left shoulder: Secondary | ICD-10-CM | POA: Diagnosis not present

## 2017-08-28 NOTE — Patient Instructions (Signed)
Continue the physical therapy, transition to home exercise program as tolerated. Do home exercises 3-4 times a week for 6 more weeks. Follow up with me as needed otherwise.

## 2017-08-29 ENCOUNTER — Encounter: Payer: Self-pay | Admitting: Family Medicine

## 2017-08-29 NOTE — Progress Notes (Signed)
PCP: Aretta Nip, MD  Subjective:   HPI: Patient is a 47 y.o. female here for right hip pain, left shoulder pain.  3/21: Patient reports for just over 2 weeks she has had worsening lateral right hip pain. Denies any radiation of this pain. No trauma or injury. Pain is a 7 out of 10 and sharp. Hard to lay on this side. No numbness, skin changes. No back pain.  5/2: Patient reports she feels only slightly better compared to last visit. Not much benefit with the injection. Has just started PT and gone 3 times - doing home exercises and stretches. Stiffness with prolonged sitting. Pain level 6/10. No skin changes.  7/30: Patient reports that she is doing better. Her pain level in her right hip is down to 0 out of 10 level. She is doing physical therapy and home exercises. Denies any swelling or bruising. She states she feels a pop in her left hip laterally sometimes. Left shoulder pain is down to 2 out of 10 level. Does bother her if she is lifting heavy items. Motion is better. No skin changes or numbness.  Past Medical History:  Diagnosis Date  . Achilles tendinitis   . Achilles tendinitis   . ADHD (attention deficit hyperactivity disorder)   . Allergy   . Arthritis   . Bipolar affective ([place])   . Bipolar disorder ([place])   . Cataracts, bilateral   . Eczema   . Ganglion cyst 09/29/2009   left wrist (2 cyst)  . Hyperprolactinemia ([place])   . Hypertension   . Lipoma   . Migraine   . Personality disorder ([place])   . Pes planus     Current Outpatient Medications on File Prior to Visit  Medication Sig Dispense Refill  . clobetasol cream (TEMOVATE) 0.05 %   3  . divalproex (DEPAKOTE ER) 500 MG 24 hr tablet Take 3 tablets (1,500 mg total) by mouth at bedtime. For mood control 90 tablet 0  . hydrOXYzine (VISTARIL) 50 MG capsule TK ONE C PO TID PRA  2  . lamoTRIgine (LAMICTAL) 150 MG tablet Take 300 mg by mouth at bedtime.  1  . levocetirizine (XYZAL) 5 MG tablet  Take 5 mg by mouth every evening.  1  . lurasidone (LATUDA) 80 MG TABS tablet Take 1 tablet (80 mg total) by mouth daily with supper. For mood control 30 tablet 0  . metoprolol succinate (TOPROL-XL) 100 MG 24 hr tablet Take 1 tablet (100 mg total) by mouth daily. Take with or immediately following a meal. 30 tablet 0  . SUMAtriptan Succinate Refill 6 MG/0.5ML SOCT Inject 0.5 ml SQ at the onset of migraine. May repeat in 2 hours if needed. 0.5 mL 5  . traZODone (DESYREL) 50 MG tablet Take 1 tablet (50 mg total) by mouth at bedtime as needed for sleep. (Patient taking differently: Take 100 mg by mouth. ) 30 tablet 0   Current Facility-Administered Medications on File Prior to Visit  Medication Dose Route Frequency Provider Last Rate Last Dose  . methylPREDNISolone acetate (DEPO-MEDROL) injection 40 mg  40 mg Intra-articular Once Elam Ellis, Sharyn Lull, MD        Past Surgical History:  Procedure Laterality Date  . ANKLE SURGERY  12/88   left   . chest nodule  1990?   rt chest wall nodule removal  . GANGLION CYST EXCISION  2011  . lipoma removal    . right bunioectomy    . SHOULDER SURGERY  01/13/2011  right, partial tear  . tumor resection left thigh      Allergies  Allergen Reactions  . Adhesive [Tape] Itching and Rash    Also reacted to Steri Strips and Band-Aids.  . Dilaudid [Hydromorphone Hcl] Itching  . Morphine Nausea And Vomiting  . Penicillins Hives    Has patient had a PCN reaction causing immediate rash, facial/tongue/throat swelling, SOB or lightheadedness with hypotension: YES Has patient had a PCN reaction causing severe rash involving mucus membranes or skin necrosis: NO Has patient had a PCN reaction that required hospitalization NO Has patient had a PCN reaction occurring within the last 10 years:NO If all of the above answers are "NO", then may proceed with Cephalosporin use.  Marland Kitchen Percocet [Oxycodone-Acetaminophen] Itching  . Prednisone Hives  . Provera  [Medroxyprogesterone Acetate] Other (See Comments)    Causes manic episodes  . Ultram [Tramadol Hcl] Itching    Social History   Socioeconomic History  . Marital status: Single    Spouse name: Not on file  . Number of children: 0  . Years of education: Not on file  . Highest education level: Not on file  Occupational History    Employer: [employer]  . Financial resource strain: Not on file  . Food insecurity:    Worry: Not on file    Inability: Not on file  . Transportation needs:    Medical: Not on file    Non-medical: Not on file  Tobacco Use  . Smoking status: Never Smoker  . Smokeless tobacco: Never Used  Substance and Sexual Activity  . Alcohol use: No    Alcohol/week: 0.0 oz  . Drug use: No  . Sexual activity: Never    Birth control/protection: Pill  Lifestyle  . Physical activity:    Days per week: Not on file    Minutes per session: Not on file  . Stress: Not on file  Relationships  . Social connections:    Talks on phone: Not on file    Gets together: Not on file    Attends religious service: Not on file    Active member of club or organization: Not on file    Attends meetings of clubs or organizations: Not on file    Relationship status: Not on file  . Intimate partner violence:    Fear of current or ex partner: Not on file    Emotionally abused: Not on file    Physically abused: Not on file    Forced sexual activity: Not on file  Other Topics Concern  . Not on file  Social History Narrative   Caffeine  2 sodas daily, 1 cup coffee daily.    Family History  Problem Relation Age of Onset  . Hypertension Mother   . Hyperlipidemia Mother   . Breast cancer Mother   . Heart attack Father   . Heart disease Father   . Hypertension Father   . Bipolar disorder Father   . Diabetes Paternal Grandfather   . Heart disease Maternal Aunt   . Breast cancer Maternal Aunt   . Heart disease Maternal Grandmother   . Cancer Maternal Grandmother         colon    BP 112/75   Pulse 62   Ht 5\' 10"  (1.778 m)   Wt 260 lb (117.9 kg)   BMI 37.31 kg/m   Review of Systems: See HPI above.     Objective:  Physical Exam:  Gen: NAD, comfortable in exam  room  Right hip: No deformity. FROM with 5/5 strength except 5-/5 hip abduction. TTP trochanteric bursa, proximal IT band.  No other tenderness. NVI distally.  Left hip: No deformity. FROM with 5/5 strength except 5-/5 hip abduction. TTP trochanteric bursa, proximal IT band.  No other tenderness. NVI distally.  Left shoulder: No swelling, ecchymoses.  No gross deformity. No TTP. FROM with mild painful arc. Positive Hawkins, negative Neers. Negative Yergasons. Strength 5/5 with empty can and resisted internal/external rotation. Negative apprehension. NV intact distally.  Assessment & Plan:  1.  Right hip pain: Secondary to IT band syndrome, trochanteric bursitis and glute medius weakness.  Continue physical therapy and transition to home program as tolerated.  Encouraged to do home exercises for 6 more weeks.  Follow-up with me as needed.  Icing, Tylenol, Aleve if needed.  2. Left shoulder pain: Secondary to rotator cuff impingement.  She is improving.  Continue with physical therapy and transition home program as tolerated.  Icing, Tylenol, Aleve as needed.

## 2017-08-30 DIAGNOSIS — M7989 Other specified soft tissue disorders: Secondary | ICD-10-CM | POA: Diagnosis not present

## 2017-09-19 ENCOUNTER — Ambulatory Visit: Payer: PPO | Attending: Family Medicine | Admitting: Physical Therapy

## 2017-09-19 ENCOUNTER — Encounter: Payer: Self-pay | Admitting: Physical Therapy

## 2017-09-19 DIAGNOSIS — M25612 Stiffness of left shoulder, not elsewhere classified: Secondary | ICD-10-CM | POA: Insufficient documentation

## 2017-09-19 DIAGNOSIS — M6281 Muscle weakness (generalized): Secondary | ICD-10-CM | POA: Insufficient documentation

## 2017-09-19 DIAGNOSIS — M62838 Other muscle spasm: Secondary | ICD-10-CM | POA: Diagnosis not present

## 2017-09-19 DIAGNOSIS — M25551 Pain in right hip: Secondary | ICD-10-CM

## 2017-09-19 DIAGNOSIS — M25512 Pain in left shoulder: Secondary | ICD-10-CM | POA: Diagnosis not present

## 2017-09-19 NOTE — Therapy (Signed)
[place], [place], [address] Phone: [phone]   Fax:  [phone]  Physical Therapy Treatment  Patient Details  Name: [name] MRN: [id number] Date of Birth: 29-Jan-1971 Referring Provider: Barbaraann Barthel   Encounter Date: 09/19/2017  PT End of Session - 09/19/17 1649    Visit Number  16    Date for PT Re-Evaluation  10/24/17    PT Start Time  1615    PT Stop Time  1715    PT Time Calculation (min)  60 min    Activity Tolerance  Patient tolerated treatment well    Behavior During Therapy  [facility] for tasks assessed/performed       Past Medical History:  Diagnosis Date  . Achilles tendinitis   . Achilles tendinitis   . ADHD (attention deficit hyperactivity disorder)   . Allergy   . Arthritis   . Bipolar affective ([place])   . Bipolar disorder ([place])   . Cataracts, bilateral   . Eczema   . Ganglion cyst 09/29/2009   left wrist (2 cyst)  . Hyperprolactinemia ([place])   . Hypertension   . Lipoma   . Migraine   . Personality disorder ([place])   . Pes planus     Past Surgical History:  Procedure Laterality Date  . ANKLE SURGERY  12/88   left   . chest nodule  1990?   rt chest wall nodule removal  . GANGLION CYST EXCISION  2011  . lipoma removal    . right bunioectomy    . SHOULDER SURGERY  01/13/2011   right, partial tear  . tumor resection left thigh      There were no vitals filed for this visit.  Subjective Assessment - 09/19/17 1615    Subjective  REports that the left shoulder is rreally doing well.  Patient continues to report right hip pain.      Currently in Pain?  Yes    Pain Score  7     Pain Location  Hip    Pain Orientation  Right    Pain Descriptors / Indicators  Sore;Aching    Aggravating Factors   standing, working  pain up to 9/10                       [facility] Adult PT Treatment/Exercise - 09/19/17 0001      Exercises   Exercises   Shoulder;Knee/Hip      Knee/Hip Exercises: Aerobic   Nustep  level 5 x 6 minutes      Knee/Hip Exercises: Machines for Strengthening   Cybex Leg Press  40# 2x15      Knee/Hip Exercises: Supine   Other Supine Knee/Hip Exercises  supine feet on ball K2C, trunk rotation and small bridges, abdominal isometric pushes into ball      Knee/Hip Exercises: Sidelying   Clams  20 reps both      Modalities   Modalities  Iontophoresis      Moist Heat Therapy   Number Minutes Moist Heat  10 Minutes    Moist Heat Location  Shoulder;Hip      Electrical Stimulation   Electrical Stimulation Location  right GT area    Electrical Stimulation Action  IFC    Electrical Stimulation Parameters  supine    Electrical Stimulation Goals  Pain      Iontophoresis   Type of Iontophoresis  Dexamethasone    Location  right  GT area    Dose  29m    Time  4 hour patch      Manual Therapy   Manual Therapy  Soft tissue mobilization;Passive ROM    Soft tissue mobilization  rolling of the right ITB    Passive ROM  PROM of HS, piriformis and ITB               PT Short Term Goals - 06/05/17 1057      PT SHORT TERM GOAL #1   Title  independent with initial HEP    Status  Achieved        PT Long Term Goals - 09/19/17 1652      PT LONG TERM GOAL #1   Title  decrease pain 50%    Status  Partially Met      PT LONG TERM GOAL #2   Title  report 50% less difficulty at work    Status  Partially Met      PT LONG TERM GOAL #3   Title  increase right shoulder AROM to 140 degrees flexion    Status  Partially Met      PT LONG TERM GOAL #4   Title  increase right hip strength to 4+/5    Status  Partially Met            Plan - 09/19/17 1649    Clinical Impression Statement  Patient reports that overall she is doing better especially the left shoulder, still having right hip pain, she is tender in the right GT area.  Some tenderness in the right ITB as well.  She has some hip abduction  weakness.  She reports that she has been absent from PT due to work issues    PT Treatment/Interventions  ADLs/Self Care Home Management;Cryotherapy;Electrical Stimulation;Iontophoresis 455mml Dexamethasone;Balance training;Therapeutic exercise;Therapeutic activities;Functional mobility training;Patient/family education;Manual techniques    PT Next Visit Plan  Continue to work on her overall strength and function    Consulted and Agree with Plan of Care  Patient       Patient will benefit from skilled therapeutic intervention in order to improve the following deficits and impairments:  Decreased range of motion, Difficulty walking, Increased muscle spasms, Pain, Decreased activity tolerance, Decreased balance, Impaired flexibility, Improper body mechanics, Postural dysfunction, Decreased strength, Decreased mobility  Visit Diagnosis: Muscle weakness (generalized) - Plan: PT plan of care cert/re-cert  Acute pain of left shoulder - Plan: PT plan of care cert/re-cert  Other muscle spasm - Plan: PT plan of care cert/re-cert  Pain in right hip - Plan: PT plan of care cert/re-cert  Stiffness of left shoulder, not elsewhere classified - Plan: PT plan of care cert/re-cert     Problem List Patient Active Problem List   Diagnosis Date Noted  . Left shoulder pain 02/18/2017  . MDD (major depressive disorder), recurrent severe, without psychosis ([place]11/29/2018  . Muscle strain 09/19/2016  . Right foot injury, subsequent encounter 08/25/2016  . Low back pain 06/27/2016  . Left wrist injury, subsequent encounter 06/27/2016  . Pain of left thumb 10/07/2015  . 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 Strain of left thumb 02/11/2015  . Strain of right forearm 02/11/2015  . Right shoulder pain 12/31/2014  . Injury of right little finger 12/31/2014  . Episodic cluster headache, 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  . 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 Parasomnia overlap  disorder 12/02/2014  . Hypersomnia, recurrent 12/02/2014  . Migraine aura, persistent, intractable, with  status migrainosus 12/02/2014  . Lower back injury 09/30/2014  . Bipolar I disorder, most recent episode depressed ([place])   . MDD (major depressive disorder), recurrent, severe, with psychosis ([place]) 09/23/2014  . Injury of left index finger 08/20/2014  . Right ankle sprain 06/01/2014  . Contusion, multiple sites 06/01/2014  . Strain of right gastrocnemius muscle 06/01/2014  . Phonophobia 05/04/2014  . Photophobia of both eyes 05/04/2014  . Emotionally unstable borderline personality disorder ([place]) 05/04/2014  . Nausea with vomiting 05/04/2014  . Mixed bipolar I disorder ([place])   . Bipolar I disorder, most recent episode mixed ([place]) 04/18/2014  . Bipolar affective disorder, depressed, mild ([name]) 04/12/2014  . Suicidal ideation 04/12/2014  . Injury of right shoulder and upper arm 02/17/2014  . Left leg pain 06/03/2013  . Right hip pain 06/03/2013  . Migraine with status migrainosus 01/08/2013  . Personality disorder ([place])   . Chronic migraine 05/08/2012  . Contact dermatitis 11/27/2011  . Major depressive disorder, recurrent episode ([place]) 10/27/2011  . Generalized anxiety disorder 10/27/2011  . 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 Borderline personality disorder ([place]) 10/27/2011  . Right foot pain 09/28/2011  . 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 Malignant tumor of muscle ([place]) 09/02/2010  . Ganglion cyst 09/29/2009  . PES PLANUS 07/01/2008  . BIPOLAR DISORDER UNSPECIFIED 06/09/2008    Sumner Boast., PT 09/19/2017, 4:54 PM  [place] [address] [place], [place], [address] Phone: [phone]   Fax:  [phone]  Name: [name] MRN: [id number] Date of Birth: April 15, 1970

## 2017-09-26 ENCOUNTER — Ambulatory Visit: Payer: PPO | Admitting: Physical Therapy

## 2017-10-02 ENCOUNTER — Ambulatory Visit: Payer: PPO | Attending: Family Medicine | Admitting: Physical Therapy

## 2017-10-02 ENCOUNTER — Encounter: Payer: Self-pay | Admitting: Physical Therapy

## 2017-10-02 DIAGNOSIS — M25612 Stiffness of left shoulder, not elsewhere classified: Secondary | ICD-10-CM

## 2017-10-02 DIAGNOSIS — M25512 Pain in left shoulder: Secondary | ICD-10-CM

## 2017-10-02 DIAGNOSIS — M25551 Pain in right hip: Secondary | ICD-10-CM | POA: Diagnosis not present

## 2017-10-02 DIAGNOSIS — M62838 Other muscle spasm: Secondary | ICD-10-CM

## 2017-10-02 DIAGNOSIS — M6281 Muscle weakness (generalized): Secondary | ICD-10-CM

## 2017-10-02 NOTE — Therapy (Signed)
[place] [place], [place], [address] Phone: [phone]   Fax:  [phone]  Physical Therapy Treatment  Patient Details  Name: [name] MRN: [id number] Date of Birth: 03/08/1970 Referring Provider: Barbaraann Barthel   Encounter Date: 10/02/2017  PT End of Session - 10/02/17 1446    Visit Number  17    Date for PT Re-Evaluation  10/24/17    PT Start Time  0998    PT Stop Time  1455    PT Time Calculation (min)  57 min    Activity Tolerance  Patient tolerated treatment well    Behavior During Therapy  [facility] for tasks assessed/performed       Past Medical History:  Diagnosis Date  . Achilles tendinitis   . Achilles tendinitis   . ADHD (attention deficit hyperactivity disorder)   . Allergy   . Arthritis   . Bipolar affective ([place])   . Bipolar disorder ([place])   . Cataracts, bilateral   . Eczema   . Ganglion cyst 09/29/2009   left wrist (2 cyst)  . Hyperprolactinemia ([place])   . Hypertension   . Lipoma   . Migraine   . Personality disorder ([place])   . Pes planus     Past Surgical History:  Procedure Laterality Date  . ANKLE SURGERY  12/88   left   . chest nodule  1990?   rt chest wall nodule removal  . GANGLION CYST EXCISION  2011  . lipoma removal    . right bunioectomy    . SHOULDER SURGERY  01/13/2011   right, partial tear  . tumor resection left thigh      There were no vitals filed for this visit.  Subjective Assessment - 10/02/17 1416    Subjective  Patient reports that her hip felt good with the patch but she has a reddened area that is still visible where the medication was.  She reports that late last week she stumbled and hit her right hip on the table and this has caused more pain    Currently in Pain?  Yes    Pain Score  7     Pain Location  Hip    Pain Orientation  Right    Pain Descriptors / Indicators  Sore    Aggravating Factors   hitting the hip    Pain Relieving  Factors  the patch helped some that night                       [facility] Adult PT Treatment/Exercise - 10/02/17 0001      Ambulation/Gait   Gait Comments  gait around the building  some difficulty coming up the hill and negotiating curbs      Knee/Hip Exercises: Stretches   Passive Hamstring Stretch  Both;3 reps;20 seconds    ITB Stretch  Both;5 reps;20 seconds    Piriformis Stretch  5 reps;20 seconds      Knee/Hip Exercises: Aerobic   Nustep  level 5 x 6 minutes      Knee/Hip Exercises: Supine   Other Supine Knee/Hip Exercises  supine feet on ball K2C, trunk rotation and small bridges, abdominal isometric pushes into ball      Knee/Hip Exercises: Sidelying   Clams  20 reps both      Moist Heat Therapy   Number Minutes Moist Heat  15 Minutes    Moist Heat Location  Shoulder;Hip  Acupuncturist Location  right GT area    Chartered certified accountant  IFC    Electrical Stimulation Parameters  supine    Electrical Stimulation Goals  Pain      Manual Therapy   Manual Therapy  Soft tissue mobilization;Passive ROM    Soft tissue mobilization  rolling of the right ITB    Passive ROM  PROM of HS, piriformis and ITB               PT Short Term Goals - 06/05/17 1057      PT SHORT TERM GOAL #1   Title  independent with initial HEP    Status  Achieved        PT Long Term Goals - 09/19/17 1652      PT LONG TERM GOAL #1   Title  decrease pain 50%    Status  Partially Met      PT LONG TERM GOAL #2   Title  report 50% less difficulty at work    Status  Partially Met      PT LONG TERM GOAL #3   Title  increase right shoulder AROM to 140 degrees flexion    Status  Partially Met      PT LONG TERM GOAL #4   Title  increase right hip strength to 4+/5    Status  Partially Met            Plan - 10/02/17 1446    Clinical Impression Statement  Patient again reporting less difficulty with the left shoulder.   Has hit her right hip on a table recently, the ionto has caused some irritation of the skin but she reported that it felt better.  She is still working 20-24 hours a week, she was able to mow the yard yesterday.  She fatigued easily with walking outside today but it was hot.  Some tightness of the ITB    PT Next Visit Plan  Continue to work on her overall strength and function    Consulted and Agree with Plan of Care  Patient       Patient will benefit from skilled therapeutic intervention in order to improve the following deficits and impairments:  Decreased range of motion, Difficulty walking, Increased muscle spasms, Pain, Decreased activity tolerance, Decreased balance, Impaired flexibility, Improper body mechanics, Postural dysfunction, Decreased strength, Decreased mobility  Visit Diagnosis: Muscle weakness (generalized)  Acute pain of left shoulder  Other muscle spasm  Pain in right hip  Stiffness of left shoulder, not elsewhere classified     Problem List Patient Active Problem List   Diagnosis Date Noted  . Left shoulder pain 02/18/2017  . MDD (major depressive disorder), recurrent severe, without psychosis ([place]) 12/28/2016  . Muscle strain 09/19/2016  . Right foot injury, subsequent encounter 08/25/2016  . Low back pain 06/27/2016  . Left wrist injury, subsequent encounter 06/27/2016  . Pain of left thumb 10/07/2015  . 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 Strain of left thumb 02/11/2015  . Strain of right forearm 02/11/2015  . Right shoulder pain 12/31/2014  . Injury of right little finger 12/31/2014  . Episodic cluster headache, 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  . 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 Hypersomnia, recurrent 12/02/2014  . Migraine aura, persistent, intractable, with status migrainosus 12/02/2014  . Lower back injury 09/30/2014  . Bipolar I disorder, most recent episode depressed  ([place])   .  MDD (major depressive disorder), recurrent, severe, with psychosis ([place]) 09/23/2014  . Injury of left index finger 08/20/2014  . Right ankle sprain 06/01/2014  . Contusion, multiple sites 06/01/2014  . Strain of right gastrocnemius muscle 06/01/2014  . Phonophobia 05/04/2014  . Photophobia of both eyes 05/04/2014  . Emotionally unstable borderline personality disorder ([place]) 05/04/2014  . Nausea with vomiting 05/04/2014  . Mixed bipolar I disorder ([place])   . Bipolar I disorder, most recent episode mixed ([place]) 04/18/2014  . Bipolar affective disorder, depressed, mild ([place]) 04/12/2014  . Suicidal ideation 04/12/2014  . Injury of right shoulder and upper arm 02/17/2014  . Left leg pain 06/03/2013  . Right hip pain 06/03/2013  . Migraine with status migrainosus 01/08/2013  . Personality disorder ([place])   . Chronic migraine 05/08/2012  . Contact dermatitis 11/27/2011  . Major depressive disorder, recurrent episode ([place]) 10/27/2011  . Generalized anxiety disorder 10/27/2011  . 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 Borderline personality disorder ([place]) 10/27/2011  . Right foot pain 09/28/2011  . 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 Ganglion cyst 09/29/2009  . PES PLANUS 07/01/2008  . BIPOLAR DISORDER UNSPECIFIED 06/09/2008    Sumner Boast., PT 10/02/2017, 2:51 PM  [place] [address] [place], [place], [address] Phone: [phone]   Fax:  [phone]  Name: WINNA GOLLA MRN: [id number] Date of Birth: 04-27-1970

## 2017-10-04 ENCOUNTER — Ambulatory Visit: Payer: PPO | Admitting: Physical Therapy

## 2017-10-04 DIAGNOSIS — G8929 Other chronic pain: Secondary | ICD-10-CM | POA: Diagnosis not present

## 2017-10-04 DIAGNOSIS — R5383 Other fatigue: Secondary | ICD-10-CM | POA: Diagnosis not present

## 2017-10-04 DIAGNOSIS — F319 Bipolar disorder, unspecified: Secondary | ICD-10-CM | POA: Diagnosis not present

## 2017-10-04 DIAGNOSIS — R234 Changes in skin texture: Secondary | ICD-10-CM | POA: Diagnosis not present

## 2017-10-04 DIAGNOSIS — M545 Low back pain: Secondary | ICD-10-CM | POA: Diagnosis not present

## 2017-10-11 ENCOUNTER — Ambulatory Visit: Payer: PPO | Admitting: Physical Therapy

## 2017-10-11 ENCOUNTER — Encounter: Payer: Self-pay | Admitting: Physical Therapy

## 2017-10-11 DIAGNOSIS — M6281 Muscle weakness (generalized): Secondary | ICD-10-CM | POA: Diagnosis not present

## 2017-10-11 DIAGNOSIS — M25612 Stiffness of left shoulder, not elsewhere classified: Secondary | ICD-10-CM

## 2017-10-11 DIAGNOSIS — M62838 Other muscle spasm: Secondary | ICD-10-CM

## 2017-10-11 DIAGNOSIS — M25551 Pain in right hip: Secondary | ICD-10-CM

## 2017-10-11 DIAGNOSIS — M25512 Pain in left shoulder: Secondary | ICD-10-CM

## 2017-10-11 NOTE — Therapy (Addendum)
[place], [place], [address] Phone: [phone]   Fax:  [phone]  Physical Therapy Treatment  Patient Details  Name: [name] MRN: [id number] Date of Birth: 07-29-1970 Referring Provider: Barbaraann Barthel   Encounter Date: 10/11/2017  PT End of Session - 10/11/17 1517    Visit Number  18    Date for PT Re-Evaluation  10/24/17    PT Start Time  1445    PT Stop Time  1533    PT Time Calculation (min)  48 min    Activity Tolerance  Patient tolerated treatment well    Behavior During Therapy  [facility] for tasks assessed/performed       Past Medical History:  Diagnosis Date  . Achilles tendinitis   . Achilles tendinitis   . ADHD (attention deficit hyperactivity disorder)   . Allergy   . Arthritis   . Bipolar affective ([place])   . Bipolar disorder ([place])   . Cataracts, bilateral   . Eczema   . Ganglion cyst 09/29/2009   left wrist (2 cyst)  . Hyperprolactinemia ([place])   . Hypertension   . Lipoma   . Migraine   . Personality disorder ([place])   . Pes planus     Past Surgical History:  Procedure Laterality Date  . ANKLE SURGERY  12/88   left   . chest nodule  1990?   rt chest wall nodule removal  . GANGLION CYST EXCISION  2011  . lipoma removal    . right bunioectomy    . SHOULDER SURGERY  01/13/2011   right, partial tear  . tumor resection left thigh      There were no vitals filed for this visit.  Subjective Assessment - 10/11/17 1459    Subjective  Patient reports very tired, she was able to go on vacation and tolerate activites and walking, reports some increaesd hip pain with increased walking    Currently in Pain?  Yes    Pain Score  6     Pain Location  Hip    Pain Orientation  Right    Aggravating Factors   walking                       [facility] Adult PT Treatment/Exercise - 10/11/17 0001      Knee/Hip Exercises: Aerobic   Nustep  level 5 x 6 minutes      Knee/Hip Exercises: Machines for Strengthening   Cybex Knee Extension  10lb 2x10    Cybex Knee Flexion  35lb 2x10    Cybex Leg Press  40# 2x15      Knee/Hip Exercises: Standing   Walking with Sports Cord  40lb side step x3 each way      Knee/Hip Exercises: Supine   Other Supine Knee/Hip Exercises  supine feet on ball K2C, trunk rotation and small bridges, abdominal isometric pushes into ball      Knee/Hip Exercises: Sidelying   Clams  20 reps both      Shoulder Exercises: Standing   External Rotation  20 reps;Theraband    Theraband Level (Shoulder External Rotation)  Level 2 (Red)    Row  15 reps;Theraband;Both;Strengthening    Theraband Level (Shoulder Row)  Level 2 (Red)      Shoulder Exercises: ROM/Strengthening   Cybex Row  20 reps;2.5 plate;3 plate      Moist Heat Therapy   Number Minutes Moist Heat  15  Minutes    Moist Heat Location  Shoulder;Hip      Electrical Stimulation   Electrical Stimulation Location  right GT area, left shoulder    Electrical Stimulation Action  premod    Electrical Stimulation Parameters  supine    Electrical Stimulation Goals  Pain               PT Short Term Goals - 06/05/17 1057      PT SHORT TERM GOAL #1   Title  independent with initial HEP    Status  Achieved        PT Long Term Goals - 10/11/17 1519      PT LONG TERM GOAL #1   Title  decrease pain 50%    Status  Partially Met      PT LONG TERM GOAL #2   Title  report 50% less difficulty at work    Status  Partially Met      PT LONG TERM GOAL #4   Title  increase right hip strength to 4+/5    Status  Achieved            Plan - 10/11/17 1518    Clinical Impression Statement  Patient on vacation the past week, her shoulder is feeling better due to not using it as much at work, she has some increased c/o pain in the right hip with the increased activity.  She needs cues to stay on task and go slow and activate the mms, mild antalgic gait on the right today  due to rigfht hip pain    PT Next Visit Plan  Continue to work on her overall strength and function    Consulted and Agree with Plan of Care  Patient       Patient will benefit from skilled therapeutic intervention in order to improve the following deficits and impairments:  Decreased range of motion, Difficulty walking, Increased muscle spasms, Pain, Decreased activity tolerance, Decreased balance, Impaired flexibility, Improper body mechanics, Postural dysfunction, Decreased strength, Decreased mobility  Visit Diagnosis: Muscle weakness (generalized)  Acute pain of left shoulder  Other muscle spasm  Pain in right hip  Stiffness of left shoulder, not elsewhere classified     Problem List Patient Active Problem List   Diagnosis Date Noted  . Left shoulder pain 02/18/2017  . MDD (major depressive disorder), recurrent severe, without psychosis ([place]) 12/28/2016  . Muscle strain 09/19/2016  . Right foot injury, subsequent encounter 08/25/2016  . Low back pain 06/27/2016  . Left wrist injury, subsequent encounter 06/27/2016  . Pain of left thumb 10/07/2015  . 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 Strain of left thumb 02/11/2015  . Strain of right forearm 02/11/2015  . Right shoulder pain 12/31/2014  . Injury of right little finger 12/31/2014  . Episodic cluster headache, 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  . 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 Hypersomnia, recurrent 12/02/2014  . Migraine aura, persistent, intractable, with status migrainosus 12/02/2014  . Lower back injury 09/30/2014  . Bipolar I disorder, most recent episode depressed ([place])   . MDD (major depressive disorder), recurrent, severe, with psychosis ([place]) 09/23/2014  . Injury of left index finger 08/20/2014  . Right ankle sprain 06/01/2014  . Contusion, multiple sites 06/01/2014  . Strain of right gastrocnemius muscle 06/01/2014  . Phonophobia  05/04/2014  . Photophobia of both eyes 05/04/2014  . Emotionally unstable borderline personality disorder ([place]) 05/04/2014  . Nausea with  vomiting 05/04/2014  . Mixed bipolar I disorder ([place])   . Bipolar I disorder, most recent episode mixed ([place]) 04/18/2014  . Bipolar affective disorder, depressed, mild ([place]) 04/12/2014  . Suicidal ideation 04/12/2014  . Injury of right shoulder and upper arm 02/17/2014  . Left leg pain 06/03/2013  . Right hip pain 06/03/2013  . Migraine with status migrainosus 01/08/2013  . Personality disorder ([place])   . Chronic migraine 05/08/2012  . Contact dermatitis 11/27/2011  . Major depressive disorder, recurrent episode ([place]) 10/27/2011  . Generalized anxiety disorder 10/27/2011  . 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 Borderline personality disorder ([place]) 10/27/2011  . Right foot pain 09/28/2011  . 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 Ganglion cyst 09/29/2009  . PES PLANUS 07/01/2008  . BIPOLAR DISORDER UNSPECIFIED 06/09/2008  PHYSICAL THERAPY DISCHARGE SUMMARY   Plan: Patient agrees to discharge.  Patient goals were partially met. Patient is being discharged due to not returning since the last visit.  ?????       Sumner Boast., PT 10/11/2017, 3:20 PM  [place] [address] [place], [place], [address] Phone: [phone]   Fax:  [phone]  Name: [name] MRN: [id number] Date of Birth: 10/26/1970

## 2017-10-23 IMAGING — CT CT HEAD W/O CM
3 series · 16 of 47 positions shown, 19 images · non-contrast
Comparison: Head CT dated 03/22/2010.  Brain MRI dated 10/11/2009

CLINICAL DATA: 45-year-old female with head trauma.

EXAM:
CT HEAD WITHOUT CONTRAST
TECHNIQUE: Contiguous axial images were obtained from the base of the skull
through the vertex without intravenous contrast.

[Series 2: head 5.0 h30s · axial · 0.43mm/px · z∈[+1077,+1207]mm · 10 of 32 slices shown, 13 images]
[im 3/32  brain]
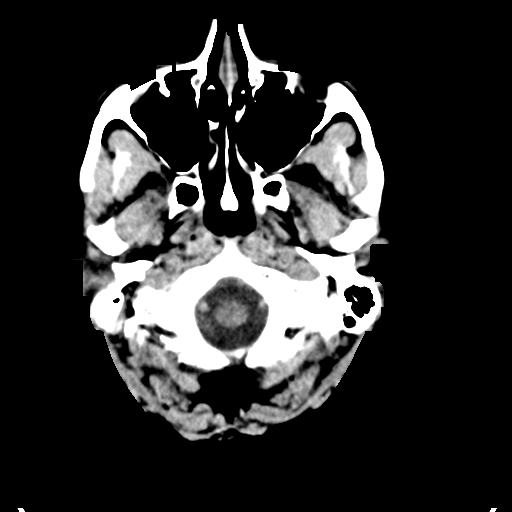
[im 3/32  bone]
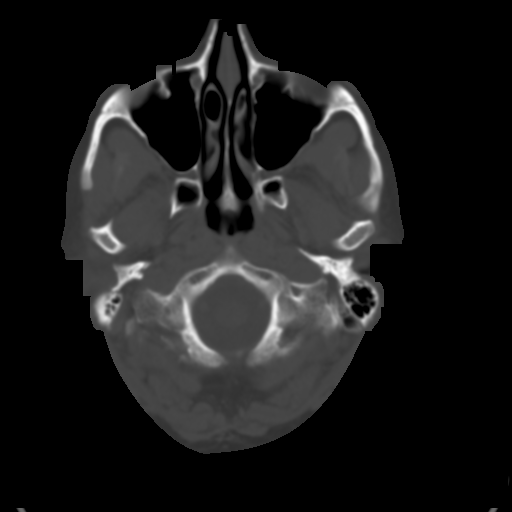
[im 6/32  brain]
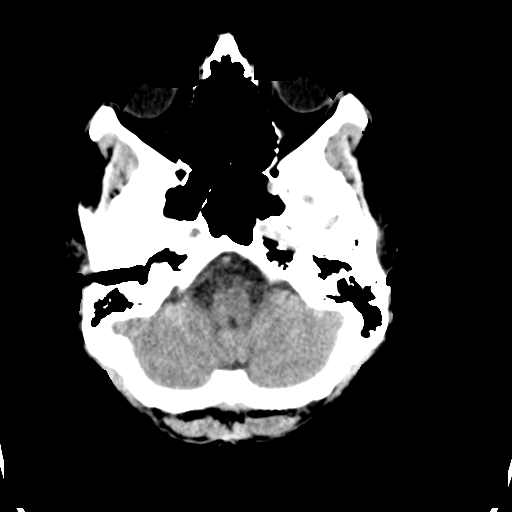
[im 9/32  brain]
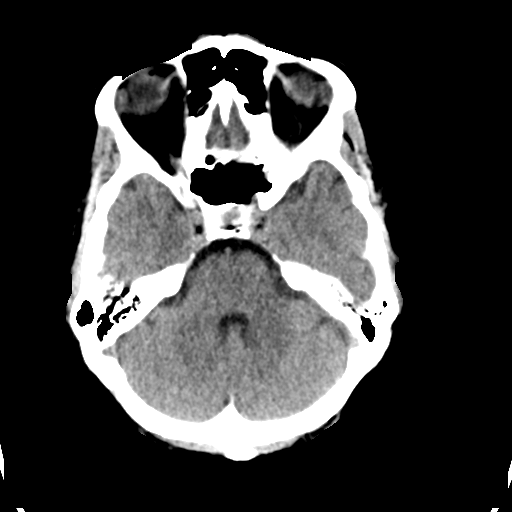
[im 11/32  brain]
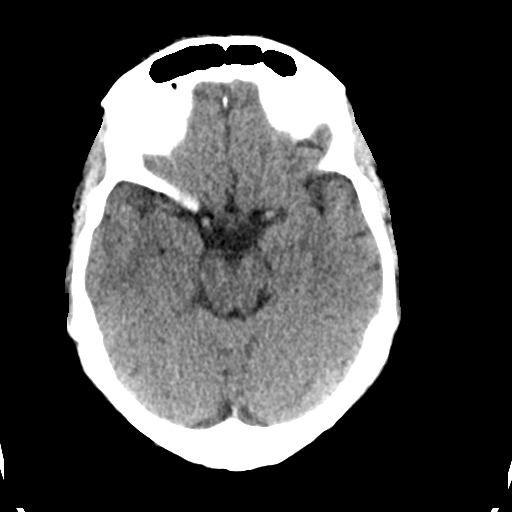
[im 14/32  brain]
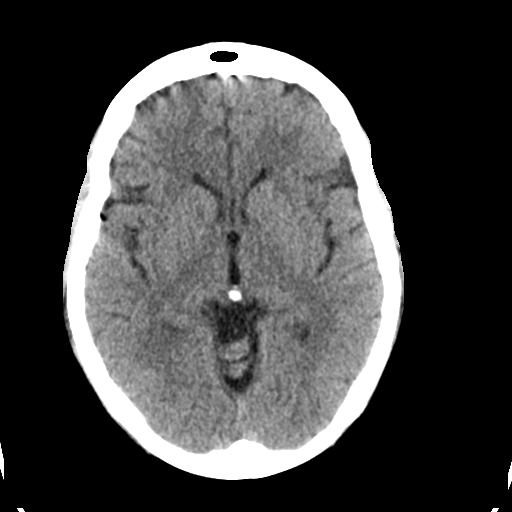
[im 14/32  bone]
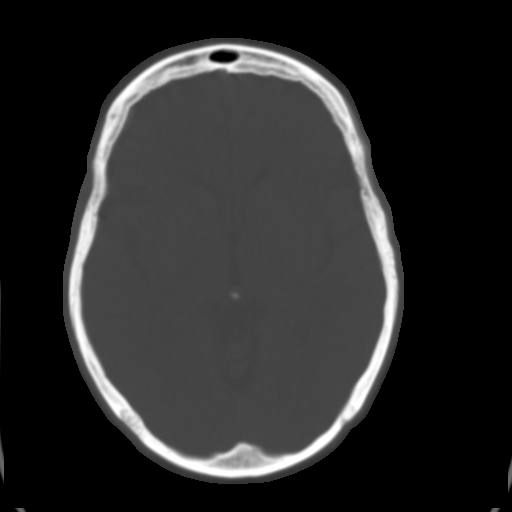
[im 18/32  brain]
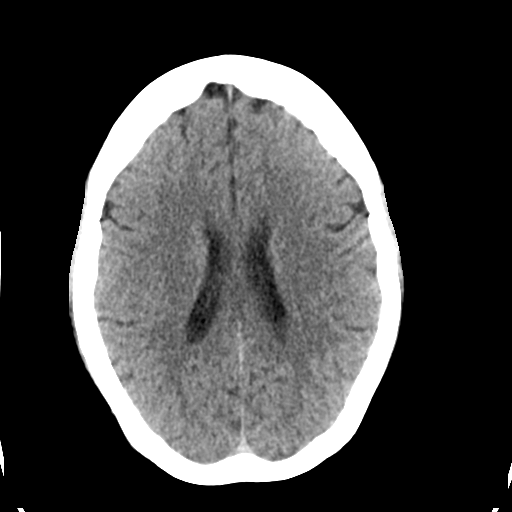
[im 21/32  brain]
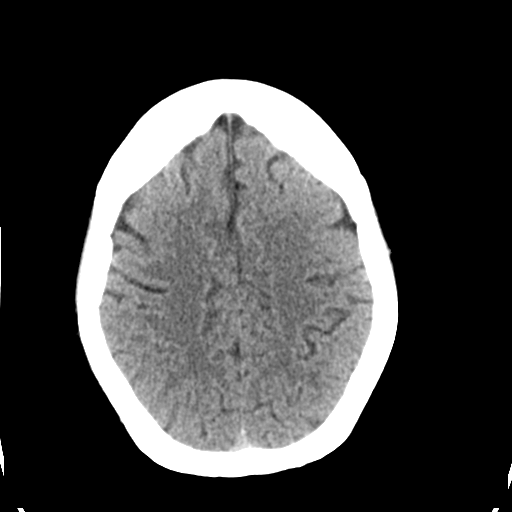
[im 24/32  brain]
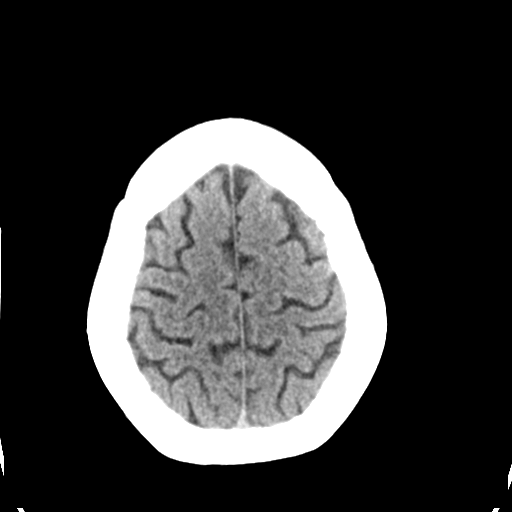
[im 26/32  brain]
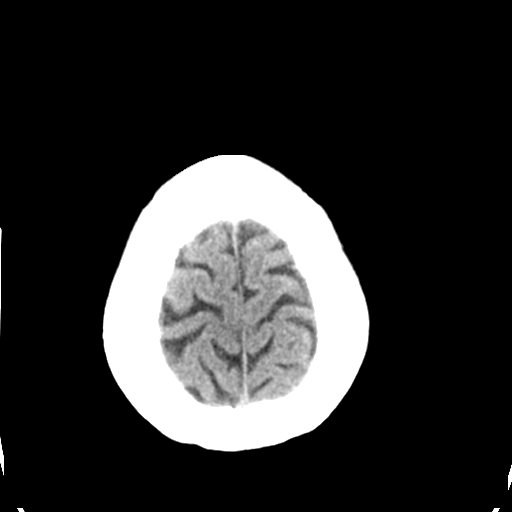
[im 26/32  bone]
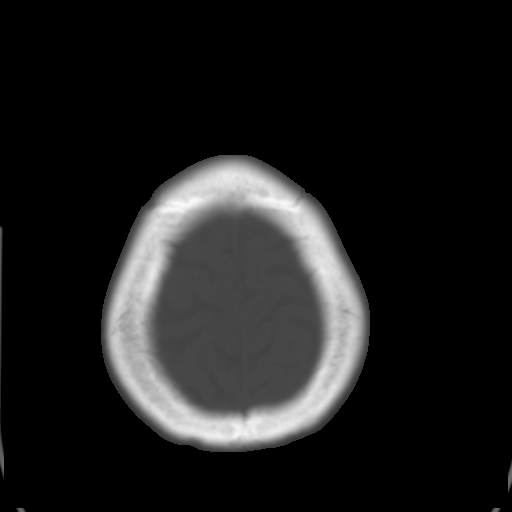
[im 29/32  brain]
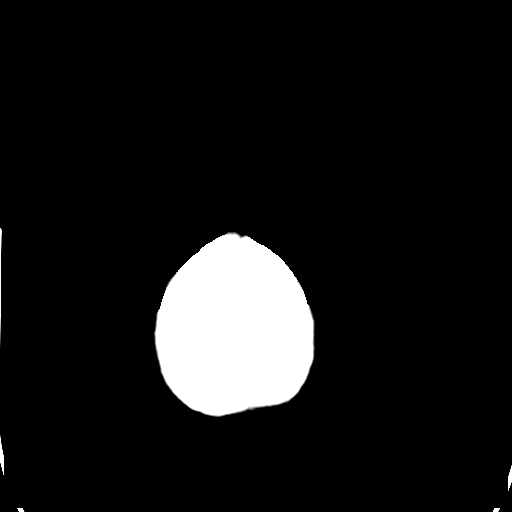

[Series 4: head 3.0 mpr · coronal · 0.30mm/px · 3 of 70 slices shown (1 of 2)]
[im 24/70  brain]
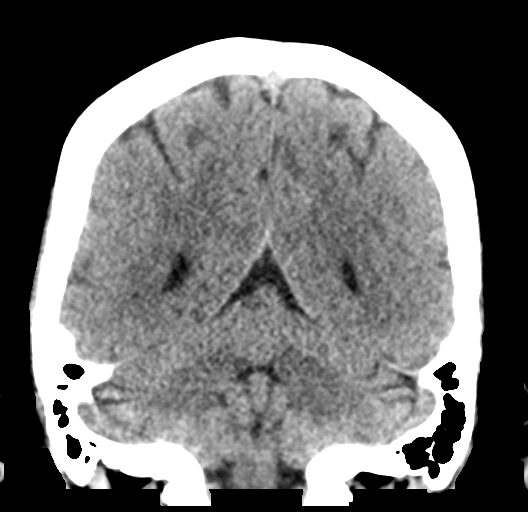
[im 31/70  brain]
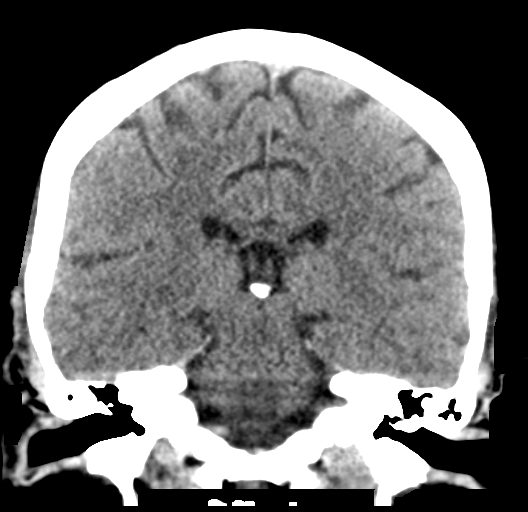
[im 39/70  brain]
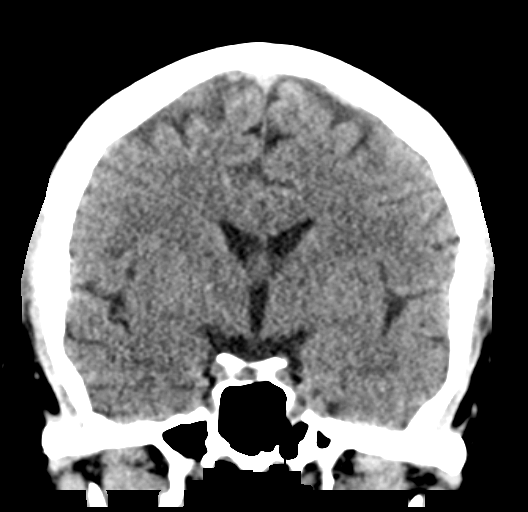

[Series 5: head 3.0 mpr · sagittal · 0.30mm/px · 3 of 54 slices shown (2 of 2)]
[im 18/54  brain]
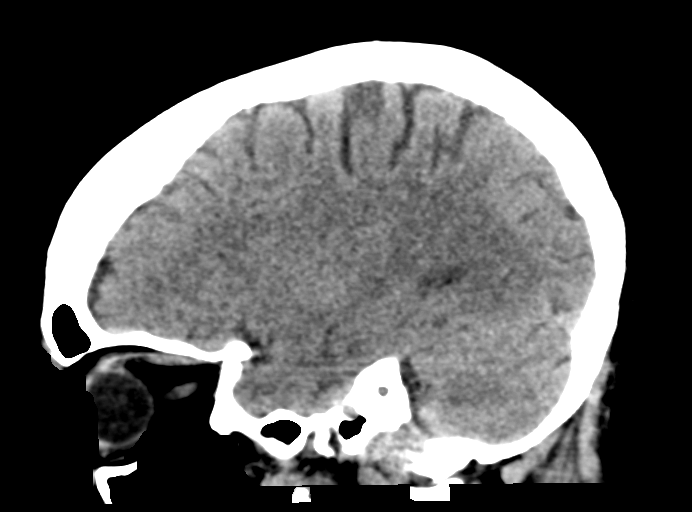
[im 27/54  brain]
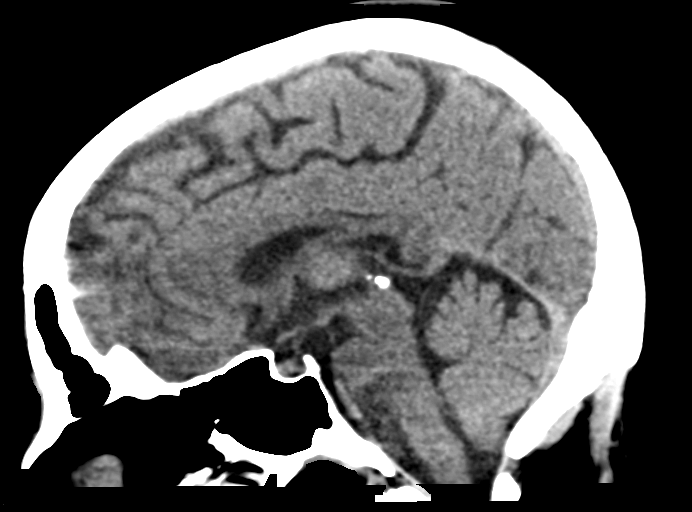
[im 36/54  brain]
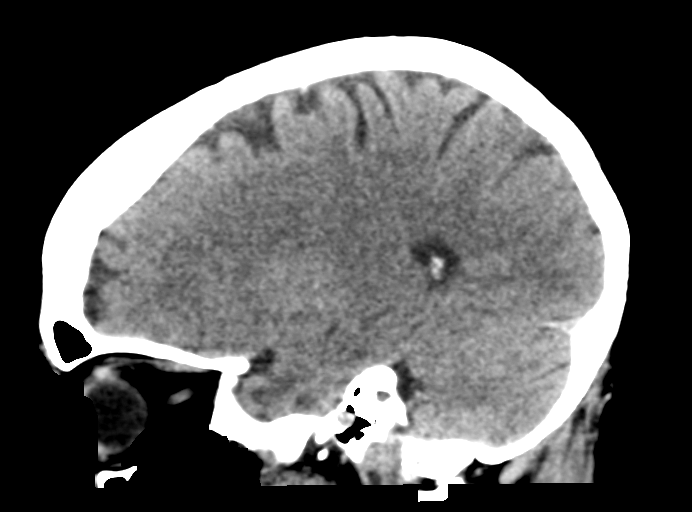

[16 of 47 positions shown; findings below may reference images not displayed]

FINDINGS: Brain: No evidence of acute infarction, hemorrhage, hydrocephalus,
extra-axial collection or mass lesion/mass effect.

Vascular: No hyperdense vessel or unexpected calcification.

Skull: Normal. Negative for fracture or focal lesion.

Sinuses/Orbits: No acute finding.

Other: None
IMPRESSION: No acute intracranial pathology.

## 2017-10-26 ENCOUNTER — Telehealth: Payer: Self-pay | Admitting: Adult Health

## 2017-10-26 NOTE — Telephone Encounter (Addendum)
Spoke with patient and advised her to call her PCP to be sure she doesn't have an infection. She stated she has also had dizziness and shaky hands x 3 weeks. This is affecting her daily life, she stated. She denies being on any new medications.  She saw her therapist today who advised she call this office. The patient stated she has been seeing her therapist x 3 years and was unable to recall her name today.  This RN advised she still call her PCP, and this note will be sent to NP. Advised her our office closes at noon today. She stated she plans to call her PCP, verbalized understanding, appreciation for call back.

## 2017-10-26 NOTE — Telephone Encounter (Signed)
Pt has called because of strange symptoms.  Pt states her memory is very spotty and she aches all over.  Pt states an example of her memory concerns is she was speaking with her therapist of 3 years and could not remember her name.  Please call

## 2017-10-29 NOTE — Telephone Encounter (Signed)
I think its best she be seen by PCP if they feel these new symptoms are neurological they can send over a new referral.

## 2017-10-29 NOTE — Telephone Encounter (Signed)
LVM for patient advising her that the NP feels it is best for her to see her PCP for these new symptoms. If PCP feels they may be neurological she can be referred to [place] for new symptoms, problems. Left office number for any questions.

## 2017-10-30 NOTE — Telephone Encounter (Signed)
Called number patient gave to phone staff. The number was to [facility] store. This RN was advised she was "not in yet".

## 2017-10-30 NOTE — Telephone Encounter (Signed)
Called patient to discuss previous message. This RN reviewed our conversation from 10/26/17. She stated that after she called earlier she thought maybe she had misunderstood the conversation. She stated she has an appointment with her PCP on Oct 16th. This RN advised she keep that appointment to have her new symptoms evaluated. Advised her they may not be neurological, but if her PCP feels they are she can be referred back. The patient stated she has a FU with NP in Dec. This RN advised if she needs to be seen sooner she can probably be rescheduled or put on the wait list. She verbalized understanding, appreciation for call back.

## 2017-10-30 NOTE — Telephone Encounter (Addendum)
Pt called said she has not been called back about being seen. I told her RN LVM yesterday, I read this to her. Pt said she does not feel she should call PCP, she thought that to be ridiculous. I explained to her she is seen here for migraines and she is having new symptoms. Patients are recommended to see PCP for new symptoms as they can eval, order test if needed and refer if needed. Pt understood this but is still requesting to speak with RN. Please call to advise, she can be reached at [phone] x [phone]

## 2017-11-01 ENCOUNTER — Other Ambulatory Visit: Payer: Self-pay | Admitting: Family Medicine

## 2017-11-01 ENCOUNTER — Ambulatory Visit
Admission: RE | Admit: 2017-11-01 | Discharge: 2017-11-01 | Disposition: A | Discharge status: Home / Self Care | Payer: PPO | Source: Ambulatory Visit | Attending: Family Medicine | Admitting: Family Medicine

## 2017-11-01 DIAGNOSIS — S060X0A Concussion without loss of consciousness, initial encounter: Secondary | ICD-10-CM

## 2017-11-01 DIAGNOSIS — R110 Nausea: Secondary | ICD-10-CM | POA: Diagnosis not present

## 2017-11-01 DIAGNOSIS — W19XXXA Unspecified fall, initial encounter: Secondary | ICD-10-CM | POA: Diagnosis not present

## 2017-11-01 DIAGNOSIS — S0990XA Unspecified injury of head, initial encounter: Secondary | ICD-10-CM | POA: Diagnosis not present

## 2017-11-08 DIAGNOSIS — J301 Allergic rhinitis due to pollen: Secondary | ICD-10-CM | POA: Diagnosis not present

## 2017-11-08 DIAGNOSIS — R21 Rash and other nonspecific skin eruption: Secondary | ICD-10-CM | POA: Diagnosis not present

## 2017-11-08 DIAGNOSIS — J3089 Other allergic rhinitis: Secondary | ICD-10-CM | POA: Diagnosis not present

## 2017-11-14 DIAGNOSIS — R251 Tremor, unspecified: Secondary | ICD-10-CM | POA: Diagnosis not present

## 2017-11-14 DIAGNOSIS — S060X0D Concussion without loss of consciousness, subsequent encounter: Secondary | ICD-10-CM | POA: Diagnosis not present

## 2017-11-19 ENCOUNTER — Telehealth: Payer: Self-pay | Admitting: Neurology

## 2017-11-19 NOTE — Telephone Encounter (Signed)
Patient is scheduled for wed morning 10/23 at 8:30 am. I have contacted the patient to make her aware that there is a opening on 11/20/17 at 11:30 am if the patient would rather take that time slot. No answer. LVM informing the patient to call if this would be something she wants.  If patient calls back please offer the slp consult slot 10/22 at 11:30 am for her.

## 2017-11-20 ENCOUNTER — Ambulatory Visit: Payer: PPO | Admitting: Neurology

## 2017-11-20 ENCOUNTER — Other Ambulatory Visit: Payer: Self-pay | Admitting: Neurology

## 2017-11-20 ENCOUNTER — Encounter: Payer: Self-pay | Admitting: Neurology

## 2017-11-20 VITALS — BP 126/82 | HR 59 | Ht 70.0 in | Wt 254.0 lb

## 2017-11-20 DIAGNOSIS — R110 Nausea: Secondary | ICD-10-CM | POA: Diagnosis not present

## 2017-11-20 DIAGNOSIS — R270 Ataxia, unspecified: Secondary | ICD-10-CM | POA: Insufficient documentation

## 2017-11-20 DIAGNOSIS — I639 Cerebral infarction, unspecified: Secondary | ICD-10-CM | POA: Diagnosis not present

## 2017-11-20 DIAGNOSIS — G43611 Persistent migraine aura with cerebral infarction, intractable, with status migrainosus: Secondary | ICD-10-CM

## 2017-11-20 DIAGNOSIS — Z87820 Personal history of traumatic brain injury: Secondary | ICD-10-CM | POA: Insufficient documentation

## 2017-11-20 DIAGNOSIS — S069X9A Unspecified intracranial injury with loss of consciousness of unspecified duration, initial encounter: Secondary | ICD-10-CM

## 2017-11-20 DIAGNOSIS — R251 Tremor, unspecified: Secondary | ICD-10-CM | POA: Diagnosis not present

## 2017-11-20 MED ORDER — VALPROATE SODIUM 500 MG/5ML IV SOLN: 1000.0000 mg | Freq: Once | INTRAVENOUS | Status: DC

## 2017-11-20 MED ORDER — DIVALPROEX SODIUM ER 500 MG PO TB24: 1000.0000 mg | ORAL_TABLET | Freq: Every day | ORAL | 0 refills | Status: DC

## 2017-11-20 MED ORDER — SUMATRIPTAN SUCCINATE REFILL 6 MG/0.5ML ~~LOC~~ SOCT: SUBCUTANEOUS | 5 refills | Status: DC

## 2017-11-20 NOTE — Patient Instructions (Addendum)
You will need to stay out of work until the end of this month- 11-29-2017  Postconcussion symptoms.     Concussion, Adult A concussion is a brain injury from a direct hit (blow) to the head or body. This injury causes the brain to shake quickly back and forth inside the skull. It is caused by:  A hit to the head.  A quick and sudden movement (jolt) of the head or neck.  How fast you will get better from a concussion depends on many things like how bad your concussion was, what part of your brain was hurt, how old you are, and how healthy you were before the concussion. Recovery can take time. It is important to wait to return to activity until a doctor says it is safe and your symptoms are all gone. Follow these instructions at home: Activity  Limit activities that need a lot of thought or concentration. These include: ? Homework or work for your job. ? Watching TV. ? Computer work. ? Playing memory games and puzzles.  Rest. Rest helps the brain to heal. Make sure you: ? Get plenty of sleep at night. Do not stay up late. ? Go to bed at the same time every day. ? Rest during the day. Take naps or rest breaks when you feel tired.  It can be dangerous if you get another concussion before the first one has healed Do not do activities that could cause a second concussion, such as riding a bike or playing sports.  Ask your doctor when you can return to your normal activities, like driving, riding a bike, or using machinery. Your ability to react may be slower. Do not do these activities if you are dizzy. Your doctor will likely give you a plan for slowly going back to activities. General instructions  Take over-the-counter and prescription medicines only as told by your doctor.  Do not drink alcohol until your doctor says you can.  If it is harder than usual to remember things, write them down.  If you are easily distracted, try to do one thing at a time. For example, do not try to  watch TV while making dinner.  Talk with family members or close friends when you need to make important decisions.  Watch your symptoms and tell other people to do the same. Other problems (complications) can happen after a concussion. Older adults with a brain injury may have a higher risk of serious problems, such as a blood clot in the brain.  Tell your teachers, school nurse, school counselor, coach, Product/process development scientist, or work Freight forwarder about your injury and symptoms. Tell them about what you can or cannot do. They should watch for: ? More problems with attention or concentration. ? More trouble remembering or learning new information. ? More time needed to do tasks or assignments. ? Being more annoyed (irritable) or having a harder time dealing with stress. ? Any other symptoms that get worse.  Keep all follow-up visits as told by your health care provider. This is important. Prevention  It is very important that you donot get another brain injury, especially before you have healed. In rare cases, another injury can cause permanent brain damage, brain swelling, or death. You have the most risk if you get another head injury in the first 7-10 days after you were hurt before. To avoid injuries: ? Wear a seat belt when you ride in a car. ? Do not drink too much alcohol. ? Avoid activities that  could make you get a second concussion, like contact sports. ? Wear a helmet when you do activities like:  Biking.  Skiing.  Skateboarding.  Skating. ? Make your home safe by:  Removing things from the floor or stairs that could make you trip.  Using grab bars in bathrooms and handrails by stairs.  Placing non-slip mats on floors and in bathtubs.  Putting more light in dark areas. Contact a doctor if:  Your symptoms get worse.  You have new symptoms.  You keep having symptoms for more than 2 weeks. Get help right away if:  You have bad headaches, or your headaches get  worse.  You have weakness in any part of your body.  You have loss of feeling (numbness).  You feel off balance.  You keep throwing up (vomiting).  You feel more sleepy.  The black center of one eye (pupil) is bigger than the other one.  You twitch or shake violently (convulse) or have a seizure.  Your speech is not clear (is slurred).  You feel more tired, more confused, or more annoyed.  You do not recognize people or places.  You have neck pain.  It is hard to wake you up.  You have strange behavior changes.  You pass out (lose consciousness). Summary  A concussion is a brain injury from a direct hit (blow) to the head or body.  This condition is treated with rest and careful watching of symptoms.  If you keep having symptoms for more than 2 weeks, call your doctor. This information is not intended to replace advice given to you by your health care provider. Make sure you discuss any questions you have with your health care provider. Document Released: 01/04/2009 Document Revised: 01/01/2016 Document Reviewed: 01/01/2016 Elsevier Interactive Patient Education  2017 [employer].

## 2017-11-20 NOTE — Progress Notes (Signed)
PATIENT: [name] DOB: 1970-06-22  REASON FOR VISIT: follow up HISTORY FROM: patient and mother , and referral notes and papers from [name], MD   [place]:    Today 11/20/17: I have the pleasure of meeting [name]. [name] today, a well-established patient in our neurologic practice, meanwhile 47 years of age, Caucasian right-handed and for the first time in the presence of her mother. Her mother is concerned that her appetite has changed, the patient is mostly worried about hand tremors, involuntary jerking motions and dizziness lightheadedness.  She states that she had on 2 consecutive days falls and in each of these falls hit her head.  She suspects that she may have a concussion.  She is usually followed here for migraine without aura but with status migrainosus and has multiple times received IV treatments to intervene.  Today her headaches are not the primary reason that she is here. She is slow , is nauseated, is depressed- and may be related to her personality disorder/ bipolar disorder.  Sees Genelle Bal, FNP.        MM- 06-26-2017 [name] is a 47 year old female with a history of migraine headaches.  She returns today for follow-up.  She reports that she typically has one headache a week.  She reports often her headaches can last up to 3 days.  She does have sumatriptan injections but does not use it regularly.  She continues on Depakote and Lamictal as prescribed by her psychiatrist.  She states that her depression is under relatively good control.  Reports that she did have a bad week last week and had to see her therapist 3 times.  She reports that her mom was recently diagnosed with breast cancer and will be undergoing radiation.  Her dad also recently passed away.  She reports that she had a couple of falls.  She is in physical therapy for her left shoulder and right hip.  She reports on occasion she will have dizzy episodes.  She also  has episodes of nausea that occur randomly with no other symptoms.  She does use over-the-counter medication such as Advil to treat her headaches.  She returns today for evaluation.    REVIEW OF SYSTEMS: Out of a complete 14 system review of symptoms, the patient complains only of the following symptoms, and all other reviewed systems were not endorsed:  [name] reports that she easily spilled strengths or has trouble holding any objects with her hand, her fine motor skills have been impaired, she reports twitching involuntary movements, tremor, she also feels lightheaded and unsteady in her gait, she had abnormal eye movements on exam.  The extreme dizziness is associated with feeling cognitively slowed as and in a fog.  She is afraid of falling..  She further endorsed difficulty swallowing, confusion headaches easy bruising increased thirst, eye pain joint pain allergies disinterest in activities, change in appetite, the feeling of needing to much sleep, depression and anxiety, hypertension.   ALLERGIES: Allergies  Allergen Reactions  . Adhesive [Tape] Itching and Rash    Also reacted to Steri Strips and Band-Aids.  . Dilaudid [Hydromorphone Hcl] Itching  . Morphine Nausea And Vomiting  . Penicillins Hives    Has patient had a PCN reaction causing immediate rash, facial/tongue/throat swelling, SOB or lightheadedness with hypotension: YES Has patient had a PCN reaction causing severe rash involving mucus membranes or skin necrosis: NO Has patient had a PCN reaction that required hospitalization  NO Has patient had a PCN reaction occurring within the last 10 years:NO If all of the above answers are "NO", then may proceed with Cephalosporin use.  Marland Kitchen Percocet [Oxycodone-Acetaminophen] Itching  . Prednisone Hives  . Provera [Medroxyprogesterone Acetate] Other (See Comments)    Causes manic episodes  . Ultram [Tramadol Hcl] Itching    HOME MEDICATIONS: Outpatient Medications Prior to Visit   Medication Sig Dispense Refill  . clobetasol cream (TEMOVATE) 0.05 %   3  . divalproex (DEPAKOTE ER) 500 MG 24 hr tablet Take 3 tablets (1,500 mg total) by mouth at bedtime. For mood control 90 tablet 0  . hydrOXYzine (VISTARIL) 50 MG capsule TK ONE C PO TID PRA  2  . lamoTRIgine (LAMICTAL) 150 MG tablet Take 300 mg by mouth at bedtime.  1  . levocetirizine (XYZAL) 5 MG tablet Take 5 mg by mouth every evening.  1  . lurasidone (LATUDA) 80 MG TABS tablet Take 1 tablet (80 mg total) by mouth daily with supper. For mood control 30 tablet 0  . metoprolol succinate (TOPROL-XL) 100 MG 24 hr tablet Take 1 tablet (100 mg total) by mouth daily. Take with or immediately following a meal. 30 tablet 0  . promethazine (PHENERGAN) 25 MG tablet TK 1 T PO BID FOR 15 DAYS PRN  0  . SUMAtriptan Succinate Refill 6 MG/0.5ML SOCT Inject 0.5 ml SQ at the onset of migraine. May repeat in 2 hours if needed. 0.5 mL 5  . traZODone (DESYREL) 50 MG tablet Take 1 tablet (50 mg total) by mouth at bedtime as needed for sleep. (Patient taking differently: Take 100 mg by mouth. ) 30 tablet 0   Facility-Administered Medications Prior to Visit  Medication Dose Route Frequency Provider Last Rate Last Dose  . methylPREDNISolone acetate (DEPO-MEDROL) injection 40 mg  40 mg Intra-articular Once Hudnall, Sharyn Lull, MD        PAST MEDICAL HISTORY: Past Medical History:  Diagnosis Date  . Achilles tendinitis   . Achilles tendinitis   . ADHD (attention deficit hyperactivity disorder)   . Allergy   . Arthritis   . Bipolar affective ([place])   . Bipolar disorder ([place])   . Cataracts, bilateral   . Eczema   . Ganglion cyst 09/29/2009   left wrist (2 cyst)  . Hyperprolactinemia ([place])   . Hypertension   . Lipoma   . Migraine   . Personality disorder ([place])   . Pes planus     PAST SURGICAL HISTORY: Past Surgical History:  Procedure Laterality Date  . ANKLE SURGERY  12/88   left   . chest nodule  1990?   rt chest wall nodule  removal  . GANGLION CYST EXCISION  2011  . lipoma removal    . right bunioectomy    . SHOULDER SURGERY  01/13/2011   right, partial tear  . tumor resection left thigh      FAMILY HISTORY: Family History  Problem Relation Age of Onset  . Hypertension Mother   . Hyperlipidemia Mother   . Breast cancer Mother   . Heart attack Father   . Heart disease Father   . Hypertension Father   . Bipolar disorder Father   . Diabetes Paternal Grandfather   . Heart disease Maternal Aunt   . Breast cancer Maternal Aunt   . Heart disease Maternal Grandmother   . Cancer Maternal Grandmother        colon    SOCIAL HISTORY: Social History   Socioeconomic  History  . Marital status: Single    Spouse name: Not on file  . Number of children: 0  . Years of education: Not on file  . Highest education level: Not on file  Occupational History    Employer: [employer]  . Financial resource strain: Not on file  . Food insecurity:    Worry: Not on file    Inability: Not on file  . Transportation needs:    Medical: Not on file    Non-medical: Not on file  Tobacco Use  . Smoking status: Never Smoker  . Smokeless tobacco: Never Used  Substance and Sexual Activity  . Alcohol use: No    Alcohol/week: 0.0 standard drinks  . Drug use: No  . Sexual activity: Never    Birth control/protection: Pill  Lifestyle  . Physical activity:    Days per week: Not on file    Minutes per session: Not on file  . Stress: Not on file  Relationships  . Social connections:    Talks on phone: Not on file    Gets together: Not on file    Attends religious service: Not on file    Active member of club or organization: Not on file    Attends meetings of clubs or organizations: Not on file    Relationship status: Not on file  . Intimate partner violence:    Fear of current or ex partner: Not on file    Emotionally abused: Not on file    Physically abused: Not on file    Forced sexual activity: Not on  file  Other Topics Concern  . Not on file  Social History Narrative   Caffeine  2 sodas daily, 1 cup coffee daily.      PHYSICAL EXAM  Vitals:   11/20/17 1142  BP: 126/82  Pulse: (!) 59  Weight: 254 lb (115.2 kg)  Height: 5\' 10"  (1.778 m)   Body mass index is 36.45 kg/m.  Generalized: Well developed, in no acute distress   Neurological examination  Mentation: Alert oriented to time, place, history taking. Follows all commands speech and language fluent Cranial nerve: taste and smell reported as decreased. Pupils were equally round and reactive to light. She presents for this first time with ptosis.   Extraocular movements were full, but there is nystagmus, corrective horizontal nystagmus to the left, not as much to the right. Her visual field were full on confrontational test,but she reports blurring.  Facial sensation and strength were normal, except for ptosis - photophobia induced squinting. Marland Kitchen  Uvula and tongue are in midline.  Head turning and shoulder shrug were normal and symmetric.  Motor: full strength of all 4 extremities with symmetric motor tone noted throughout.  Sensory: intact to soft touch on all 4 extremities. No evidence of extinction is noted.  Coordination:  finger-nose- testing - slowed, and with ataxia , dysmetria rather than tremor- deliberate or true organic?  Gait and station: Gait is unsteady  Tandem gait was deferred. Romberg , too . No drift is seen.  Reflexes: Deep tendon reflexes are symmetric and normal bilaterally.   DIAGNOSTIC DATA (LABS, IMAGING, TESTING) - I reviewed patient records, labs, notes, testing and imaging myself where available.  Lab Results  Component Value Date   WBC 7.4 12/29/2016   HGB 15.2 (H) 12/29/2016   HCT 44.6 12/29/2016   MCV 93.9 12/29/2016   PLT 241 12/29/2016      Component Value Date/Time   NA  140 12/29/2016 0627   NA 143 02/21/2016 1017   K 3.9 12/29/2016 0627   CL 104 12/29/2016 0627   CO2 28  12/29/2016 0627   GLUCOSE 88 12/29/2016 0627   BUN 16 12/29/2016 0627   BUN 10 02/21/2016 1017   CREATININE 1.09 (H) 12/29/2016 0627   CREATININE 0.83 09/11/2011 1524   CALCIUM 9.5 12/29/2016 0627   PROT 6.3 (L) 12/29/2016 0627   PROT 5.6 (L) 02/21/2016 1017   ALBUMIN 3.8 12/29/2016 0627   ALBUMIN 3.9 02/21/2016 1017   AST 27 12/29/2016 0627   ALT 14 12/29/2016 0627   ALKPHOS 60 12/29/2016 0627   BILITOT 0.6 12/29/2016 0627   BILITOT 0.5 02/21/2016 1017   GFRNONAA 59 (L) 12/29/2016 0627   GFRAA >60 12/29/2016 0627   Lab Results  Component Value Date   CHOL 198 12/29/2016   HDL 53 12/29/2016   LDLCALC 118 (H) 12/29/2016   TRIG 136 12/29/2016   CHOLHDL 3.7 12/29/2016   Lab Results  Component Value Date   HGBA1C 5.1 12/29/2016   No results found for: [id number] Lab Results  Component Value Date   TSH 4.288 12/29/2016      ASSESSMENT AND PLAN 47 y.o. year old female patient , long established , formerly with Dr. Erling Cruz, presented with chronic headaches, post traumatic migraines, and concussion.   1. Had 2 falls at night time ( 11.30 Pm each time ) , hit her head each event- possible concussion . CT negative, reviewed with patient - no bleed. She feels forgetful, but appears very depressed and cognitively slowed.  2. Tremor, subjective , involuntary movements are noted, jerking-  3.  Migraine headache increased post trauma. 4. High stress level at work.    The patient will continue on Depakote and lamictal- both prescribed by her psychiatrist.  I have encouraged the patient to take sumatriptan injection as soon as the headache starts.  Hopefully this will eliminate the headaches from lasting 3 days. She has still 8 migraines or more each month, 15 headaches.  Aimovig is not covered by her insurance, will try Emgality ?  Medicare may not cover either.   The patient does have a migraine today.  This is day 2 of her migraine.  If her headache does not resolve by tomorrow she  will let us know and we will do an infusion.  She is advised that if her symptoms worsen or she develops new symptoms she should let us know.  She will follow-up in 4-6 months with Np - follow up on possible post concussion syndrome or sooner if needed.   I spent 35 minutes with the patient. 50% of this time was spent discussing her new concern of concussion, and headache medication.  I will order Emgality. Iv Depakote today, refill Imitrex.    MRI brain -    Larey Seat, MD    11/20/2017, 11:53 AM [facility] [address] [place], [place] [address] [phone]

## 2017-11-21 ENCOUNTER — Institutional Professional Consult (permissible substitution): Payer: PPO | Admitting: Neurology

## 2017-11-21 DIAGNOSIS — Z0289 Encounter for other administrative examinations: Secondary | ICD-10-CM

## 2017-11-22 ENCOUNTER — Telehealth: Payer: Self-pay | Admitting: Neurology

## 2017-11-22 DIAGNOSIS — S069X9A Unspecified intracranial injury with loss of consciousness of unspecified duration, initial encounter: Secondary | ICD-10-CM

## 2017-11-22 DIAGNOSIS — R270 Ataxia, unspecified: Secondary | ICD-10-CM

## 2017-11-22 DIAGNOSIS — G43709 Chronic migraine without aura, not intractable, without status migrainosus: Secondary | ICD-10-CM

## 2017-11-22 DIAGNOSIS — R42 Dizziness and giddiness: Secondary | ICD-10-CM

## 2017-11-22 DIAGNOSIS — IMO0002 Reserved for concepts with insufficient information to code with codable children: Secondary | ICD-10-CM

## 2017-11-22 NOTE — Telephone Encounter (Signed)
Patient called in stating that she would like to do physical therapy and start emgalty.. She spoke to Dr. Brett Fairy about this at her last visit.

## 2017-11-26 ENCOUNTER — Other Ambulatory Visit: Payer: Self-pay | Admitting: Neurology

## 2017-11-26 ENCOUNTER — Telehealth: Payer: Self-pay | Admitting: *Deleted

## 2017-11-26 MED ORDER — GALCANEZUMAB-GNLM 120 MG/ML ~~LOC~~ SOAJ: 2.0000 "pen " | Freq: Once | SUBCUTANEOUS | 0 refills | Status: AC

## 2017-11-26 MED ORDER — EMGALITY (300 MG DOSE) 100 MG/ML ~~LOC~~ SOSY: 200.0000 mg | PREFILLED_SYRINGE | SUBCUTANEOUS | 3 refills | Status: DC

## 2017-11-26 NOTE — Telephone Encounter (Signed)
REFERRAL FOR PT ALSO PLACED FOR THE PATIENT.

## 2017-11-26 NOTE — Telephone Encounter (Signed)
Emgality order placed and sent to the pharmacy for the patient to receive the loading dose of the emgality. I attempted PA on the medication ahead of time for the patient and it states the medication doesn't require authorization.

## 2017-11-26 NOTE — Addendum Note (Signed)
Addended by: Darleen Crocker on: 11/26/2017 04:10 PM   Modules accepted: Orders

## 2017-11-26 NOTE — Telephone Encounter (Signed)
Error

## 2017-11-26 NOTE — Telephone Encounter (Signed)
I reduced her oral Depakote due to tremors, from 1500 mg daily to 1000 mg, and ordered a Iv depakene drip for the day of her visit. I will look into medicare coverage for EMGALITY> if covered, will have order in chart. 200 mg subcutaneous the first treatment.

## 2017-11-26 NOTE — Addendum Note (Signed)
Addended by: Larey Seat on: 11/26/2017 11:46 AM   Modules accepted: Orders

## 2017-12-17 ENCOUNTER — Other Ambulatory Visit: Payer: Self-pay | Admitting: Neurology

## 2018-01-02 ENCOUNTER — Ambulatory Visit (INDEPENDENT_AMBULATORY_CARE_PROVIDER_SITE_OTHER): Payer: PPO | Admitting: Adult Health

## 2018-01-02 ENCOUNTER — Ambulatory Visit: Payer: PPO | Attending: Family Medicine | Admitting: Physical Therapy

## 2018-01-02 ENCOUNTER — Telehealth: Payer: Self-pay | Admitting: Adult Health

## 2018-01-02 ENCOUNTER — Encounter: Payer: Self-pay | Admitting: Physical Therapy

## 2018-01-02 ENCOUNTER — Other Ambulatory Visit: Payer: Self-pay

## 2018-01-02 ENCOUNTER — Ambulatory Visit: Payer: PPO | Admitting: Adult Health

## 2018-01-02 ENCOUNTER — Encounter: Payer: Self-pay | Admitting: Adult Health

## 2018-01-02 VITALS — BP 137/97 | HR 61 | Ht 70.0 in | Wt 252.8 lb

## 2018-01-02 DIAGNOSIS — R2681 Unsteadiness on feet: Secondary | ICD-10-CM

## 2018-01-02 DIAGNOSIS — R4701 Aphasia: Secondary | ICD-10-CM | POA: Insufficient documentation

## 2018-01-02 DIAGNOSIS — R41841 Cognitive communication deficit: Secondary | ICD-10-CM | POA: Diagnosis not present

## 2018-01-02 DIAGNOSIS — R296 Repeated falls: Secondary | ICD-10-CM | POA: Insufficient documentation

## 2018-01-02 DIAGNOSIS — R42 Dizziness and giddiness: Secondary | ICD-10-CM | POA: Diagnosis not present

## 2018-01-02 DIAGNOSIS — G43009 Migraine without aura, not intractable, without status migrainosus: Secondary | ICD-10-CM | POA: Diagnosis not present

## 2018-01-02 DIAGNOSIS — M542 Cervicalgia: Secondary | ICD-10-CM | POA: Insufficient documentation

## 2018-01-02 DIAGNOSIS — M6281 Muscle weakness (generalized): Secondary | ICD-10-CM | POA: Insufficient documentation

## 2018-01-02 MED ORDER — GALCANEZUMAB-GNLM 120 MG/ML ~~LOC~~ SOAJ: 120.0000 mg | SUBCUTANEOUS | 11 refills | Status: DC

## 2018-01-02 NOTE — Telephone Encounter (Signed)
[name] with covermymeds([phone]) requesting a call to discuss PA for medication Emgality reference: [id number] please advise

## 2018-01-02 NOTE — Telephone Encounter (Signed)
I called CMM spoke to [place].  ? Of needing to repeat PA due to question DAW1.  She looked further and was noted not to have that question listed.  She will call insurance or pharmacy and if needs anything further from [place] will call us back.

## 2018-01-02 NOTE — Progress Notes (Signed)
PATIENT: [name] DOB: 07/02/1970  REASON FOR VISIT: follow up HISTORY FROM: patient  HISTORY OF PRESENT ILLNESS: Today 01/02/18 :  [name] is a 47 year old female with a history of postconcussive syndrome and migraine headaches.  She returns today for follow-up.  She reports that since she started [facility] in November she has not had any headaches.  She has not yet taken her dose for December.  She reports that she does need a refill.  She still notes trouble with her gait and balance.  She reports that there are times that she continues to bump into things. She states that when she is driving sometimes she hovers over the line.  Overall she is satisfied with the improvement in her headaches.  She returns today for evaluation.  HISTORY (Copied from Dr.Dohmeier's note)11/20/17: I have the pleasure of meeting [name]. [name] today, a well-established patient in our neurologic practice, meanwhile 47 years of age, Caucasian right-handed and for the first time in the presence of her mother. Her mother is concerned that her appetite has changed, the patient is mostly worried about hand tremors, involuntary jerking motions and dizziness lightheadedness.  She states that she had on 2 consecutive days falls and in each of these falls hit her head.  She suspects that she may have a concussion.  She is usually followed here for migraine without aura but with status migrainosus and has multiple times received IV treatments to intervene.  Today her headaches are not the primary reason that she is here. She is slow , is nauseated, is depressed- and may be related to her personality disorder/ bipolar disorder.  Sees Genelle Bal, FNP.    REVIEW OF SYSTEMS: Out of a complete 14 system review of symptoms, the patient complains only of the following symptoms, and all other reviewed systems are negative.  Light sensitivity, constipation, insomnia, appetite change, memory loss, dizziness,  headache, joint pain, back pain, neck pain, agitation, nervous/anxious  ALLERGIES: Allergies  Allergen Reactions  . Adhesive [Tape] Itching and Rash    Also reacted to Steri Strips and Band-Aids.  . Dilaudid [Hydromorphone Hcl] Itching  . Morphine Nausea And Vomiting  . Penicillins Hives    Has patient had a PCN reaction causing immediate rash, facial/tongue/throat swelling, SOB or lightheadedness with hypotension: YES Has patient had a PCN reaction causing severe rash involving mucus membranes or skin necrosis: NO Has patient had a PCN reaction that required hospitalization NO Has patient had a PCN reaction occurring within the last 10 years:NO If all of the above answers are "NO", then may proceed with Cephalosporin use.  Marland Kitchen Percocet [Oxycodone-Acetaminophen] Itching  . Prednisone Hives  . Provera [Medroxyprogesterone Acetate] Other (See Comments)    Causes manic episodes  . Ultram [Tramadol Hcl] Itching    HOME MEDICATIONS: Outpatient Medications Prior to Visit  Medication Sig Dispense Refill  . clobetasol cream (TEMOVATE) 0.05 %   3  . divalproex (DEPAKOTE ER) 500 MG 24 hr tablet TAKE 2 TABLETS BY MOUTH AT BEDTIME FOR MOOD CONTROL 60 tablet 0  . hydrochlorothiazide (HYDRODIURIL) 25 MG tablet Take 25 mg by mouth as needed.  1  . hydrOXYzine (VISTARIL) 50 MG capsule TK ONE C PO TID PRA  2  . lamoTRIgine (LAMICTAL) 150 MG tablet Take 300 mg by mouth at bedtime.  1  . levocetirizine (XYZAL) 5 MG tablet Take 5 mg by mouth every evening.  1  . lurasidone (LATUDA) 80 MG TABS tablet Take 1  tablet (80 mg total) by mouth daily with supper. For mood control 30 tablet 0  . metoprolol succinate (TOPROL-XL) 100 MG 24 hr tablet Take 1 tablet (100 mg total) by mouth daily. Take with or immediately following a meal. 30 tablet 0  . promethazine (PHENERGAN) 25 MG tablet TK 1 T PO BID FOR 15 DAYS PRN  0  . SUMAtriptan Succinate Refill 6 MG/0.5ML SOCT Inject 0.5 ml SQ at the onset of migraine. May  repeat in 2 hours if needed. 0.5 mL 5  . traZODone (DESYREL) 50 MG tablet Take 1 tablet (50 mg total) by mouth at bedtime as needed for sleep. (Patient taking differently: Take 100 mg by mouth. ) 30 tablet 0  . EMGALITY 120 MG/ML SOAJ Inject 120 mg into the skin 1 day or 1 dose.  0  . LATUDA 20 MG TABS tablet Take 20 mg by mouth every morning.  2   Facility-Administered Medications Prior to Visit  Medication Dose Route Frequency Provider Last Rate Last Dose  . methylPREDNISolone acetate (DEPO-MEDROL) injection 40 mg  40 mg Intra-articular Once Hudnall, Sharyn Lull, MD      . valproate (DEPACON) 1,000 mg in dextrose 5 % 50 mL IVPB  1,000 mg Intravenous Once Dohmeier, Asencion Partridge, MD        PAST MEDICAL HISTORY: Past Medical History:  Diagnosis Date  . Achilles tendinitis   . Achilles tendinitis   . ADHD (attention deficit hyperactivity disorder)   . Allergy   . Arthritis   . Bipolar affective ([place])   . Bipolar disorder ([place])   . Cataracts, bilateral   . Eczema   . Ganglion cyst 09/29/2009   left wrist (2 cyst)  . Hyperprolactinemia ([place])   . Hypertension   . Lipoma   . Migraine   . Personality disorder ([place])   . Pes planus     PAST SURGICAL HISTORY: Past Surgical History:  Procedure Laterality Date  . ANKLE SURGERY  12/88   left   . chest nodule  1990?   rt chest wall nodule removal  . GANGLION CYST EXCISION  2011  . lipoma removal    . right bunioectomy    . SHOULDER SURGERY  01/13/2011   right, partial tear  . tumor resection left thigh      FAMILY HISTORY: Family History  Problem Relation Age of Onset  . Hypertension Mother   . Hyperlipidemia Mother   . Breast cancer Mother   . Heart attack Father   . Heart disease Father   . Hypertension Father   . Bipolar disorder Father   . Diabetes Paternal Grandfather   . Heart disease Maternal Aunt   . Breast cancer Maternal Aunt   . Heart disease Maternal Grandmother   . Cancer Maternal Grandmother        colon     SOCIAL HISTORY: Social History   Socioeconomic History  . Marital status: Single    Spouse name: Not on file  . Number of children: 0  . Years of education: Not on file  . Highest education level: Not on file  Occupational History    Employer: [employer]  . Financial resource strain: Not on file  . Food insecurity:    Worry: Not on file    Inability: Not on file  . Transportation needs:    Medical: Not on file    Non-medical: Not on file  Tobacco Use  . Smoking status: Never Smoker  . Smokeless tobacco:  Never Used  Substance and Sexual Activity  . Alcohol use: No    Alcohol/week: 0.0 standard drinks  . Drug use: No  . Sexual activity: Never    Birth control/protection: Pill  Lifestyle  . Physical activity:    Days per week: Not on file    Minutes per session: Not on file  . Stress: Not on file  Relationships  . Social connections:    Talks on phone: Not on file    Gets together: Not on file    Attends religious service: Not on file    Active member of club or organization: Not on file    Attends meetings of clubs or organizations: Not on file    Relationship status: Not on file  . Intimate partner violence:    Fear of current or ex partner: Not on file    Emotionally abused: Not on file    Physically abused: Not on file    Forced sexual activity: Not on file  Other Topics Concern  . Not on file  Social History Narrative   Caffeine  2 sodas daily, 1 cup coffee daily.      PHYSICAL EXAM  Vitals:   01/02/18 1046  BP: (!) 137/97  Pulse: 61  Weight: 252 lb 12.8 oz (114.7 kg)  Height: 5\' 10"  (1.778 m)   Body mass index is 36.27 kg/m.  Generalized: Well developed, in no acute distress   Neurological examination  Mentation: Alert oriented to time, place, history taking. Follows all commands speech and language fluent Cranial nerve II-XII: Pupils were equal round reactive to light. Extraocular movements were full, visual field were full on  confrontational test. Facial sensation and strength were normal. Uvula tongue midline. Head turning and shoulder shrug  were normal and symmetric. Motor: The motor testing reveals 5 over 5 strength of all 4 extremities. Good symmetric motor tone is noted throughout.  Sensory: Sensory testing is intact to soft touch on all 4 extremities. No evidence of extinction is noted.  Coordination: Cerebellar testing reveals good finger-nose-finger and heel-to-shin bilaterally.  Gait and station: Gait is normal at times slightly unsteady. Reflexes: Deep tendon reflexes are symmetric and normal bilaterally.   DIAGNOSTIC DATA (LABS, IMAGING, TESTING) - I reviewed patient records, labs, notes, testing and imaging myself where available.  Lab Results  Component Value Date   WBC 7.4 12/29/2016   HGB 15.2 (H) 12/29/2016   HCT 44.6 12/29/2016   MCV 93.9 12/29/2016   PLT 241 12/29/2016      Component Value Date/Time   NA 140 12/29/2016 0627   NA 143 02/21/2016 1017   K 3.9 12/29/2016 0627   CL 104 12/29/2016 0627   CO2 28 12/29/2016 0627   GLUCOSE 88 12/29/2016 0627   BUN 16 12/29/2016 0627   BUN 10 02/21/2016 1017   CREATININE 1.09 (H) 12/29/2016 0627   CREATININE 0.83 09/11/2011 1524   CALCIUM 9.5 12/29/2016 0627   PROT 6.3 (L) 12/29/2016 0627   PROT 5.6 (L) 02/21/2016 1017   ALBUMIN 3.8 12/29/2016 0627   ALBUMIN 3.9 02/21/2016 1017   AST 27 12/29/2016 0627   ALT 14 12/29/2016 0627   ALKPHOS 60 12/29/2016 0627   BILITOT 0.6 12/29/2016 0627   BILITOT 0.5 02/21/2016 1017   GFRNONAA 59 (L) 12/29/2016 0627   GFRAA >60 12/29/2016 0627   Lab Results  Component Value Date   CHOL 198 12/29/2016   HDL 53 12/29/2016   LDLCALC 118 (H) 12/29/2016   TRIG 136  12/29/2016   CHOLHDL 3.7 12/29/2016   Lab Results  Component Value Date   HGBA1C 5.1 12/29/2016   No results found for: [id number] Lab Results  Component Value Date   TSH 4.288 12/29/2016      ASSESSMENT AND PLAN 47 y.o. year old  female  has a past medical history of Achilles tendinitis, Achilles tendinitis, ADHD (attention deficit hyperactivity disorder), Allergy, Arthritis, Bipolar affective ([place]), Bipolar disorder ([place]), Cataracts, bilateral, Eczema, Ganglion cyst (09/29/2009), Hyperprolactinemia ([place]), Hypertension, Lipoma, Migraine, Personality disorder ([place]), and Pes planus. here with :  1.  Migraine headaches 2.  Gait abnormality  The patient will continue on Emgality and Depakote.  I have advised that if her headache frequency increases she should let us know.  She will follow-up in 6 months or sooner if needed.   Ward Givens, MSN, NP-C 01/02/2018, 11:33 AM [facility] [address], [place], [place] [address] [phone]

## 2018-01-02 NOTE — Patient Instructions (Signed)
Your Plan:  Continue Emgality and Depakote If your symptoms worsen or you develop new symptoms please let us know.   Thank you for coming to see [place] at [facility]. I hope we have been able to provide you high quality care today.  You may receive a patient satisfaction survey over the next few weeks. We would appreciate your feedback and comments so that we may continue to improve ourselves and the health of our patients.

## 2018-01-02 NOTE — Therapy (Signed)
[facility] [address] [place], [place], [address] Phone: [phone]   Fax:  [phone]  Physical Therapy Evaluation  Patient Details  Name: [name] MRN: [id number] Date of Birth: [birth year] Referring Provider (PT): Larey Seat, MD   Encounter Date: 01/02/2018  PT End of Session - 01/02/18 1127    Visit Number  1    Number of Visits  17    Date for PT Re-Evaluation  03/03/18    Authorization Type  $15 copay; [employer].  VL: follow Medicare guidelines.  10th visit PN    PT Start Time  [phone]    PT Stop Time  1024    PT Time Calculation (min)  45 min    Activity Tolerance  Patient tolerated treatment well    Behavior During Therapy  [facility] for tasks assessed/performed       Past Medical History:  Diagnosis Date  . Achilles tendinitis   . Achilles tendinitis   . ADHD (attention deficit hyperactivity disorder)   . Allergy   . Arthritis   . Bipolar affective ([place])   . Bipolar disorder ([place])   . Cataracts, bilateral   . Eczema   . Ganglion cyst 09/29/2009   left wrist (2 cyst)  . Hyperprolactinemia ([place])   . Hypertension   . Lipoma   . Migraine   . Personality disorder ([place])   . Pes planus     Past Surgical History:  Procedure Laterality Date  . ANKLE SURGERY  12/88   left   . chest nodule  1990?   rt chest wall nodule removal  . GANGLION CYST EXCISION  2011  . lipoma removal    . right bunioectomy    . SHOULDER SURGERY  01/13/2011   right, partial tear  . tumor resection left thigh      There were no vitals filed for this visit.   Subjective Assessment - 01/02/18 0945    Subjective  Pt returns to therapy following 2 falls at home at night after going to the restroom.  No injury during first fall but during second fall she did hit her head on shelving.  MD diagnosed her with a mild concussion.  Out of work for 17 days.  Has been back at work since the first of November.  Continues  to notice balance impairments, tripping on her feet, leaning against walls and counter tops.  Was having headaches but they are improving with medication.  Still has some neck pain but had neck pain premorbid.  Denies changes in vision since fall.  Does report dizziness since falls.  Also reports impaired memory and word finding difficulty.    Pertinent History  OT for shoulder pain, PT at [facility] for hip; multiple falls, achilles tendonitis, ADHD, OA, bipolar disorder, cataracts bilaterally, eczema, HTN, lipoma with removal, migraine, personality disorder, suicide ideation, L ankle surgery, R bunionectomy, R shoulder surgery and L thigh tumor resection    Limitations  Walking    Diagnostic tests  CT was negative for acute changes, neurology has ordered and MRI - not performed yet    Patient Stated Goals  To be more steady and to improve neck ROM    Currently in Pain?  Yes   hip pain premorbid   Pain Score  5     Pain Location  Neck    Pain Descriptors / Indicators  Sore    Pain Type  Acute pain    Pain Onset  1 to 4 weeks ago    Pain Frequency  Several days a week    Pain Relieving Factors  heat         [facility] PT Assessment - 01/02/18 0953      Assessment   Medical Diagnosis  Head Injury s/p fall    Referring Provider (PT)  Larey Seat, MD    Onset Date/Surgical Date  11/20/17    Next MD Visit  Today with neurology NP    Prior Therapy  yes outpatient OT for shoulder and PT for hip pain      Precautions   Precautions  Other (comment)    Precaution Comments  achilles tendonitis, ADHD, OA, bipolar disorder, cataracts bilaterally, eczema, HTN, lipoma with removal, migraine, personality disorder, suicide ideation, L ankle surgery, R bunionectomy, R shoulder surgery and L thigh tumor resection      Balance Screen   Has the patient fallen in the past 6 months  Yes    How many times?  [place] residence    Living Arrangements  Parent     Type of [place] to enter    Entrance Stairs-Number of Steps  1    [place]  One level      Prior Function   Level of [place]   is driving   Vocation  Part time employment    Soil scientist: working at [employer]      Observation/Other Assessments   Focus on Therapeutic Outcomes (FOTO)   Not assessed      Sensation   Light Touch  Impaired by gross assessment    Additional Comments  reports tingling in hands      ROM / Strength   AROM / PROM / Strength  Strength;AROM      AROM   Overall AROM   Deficits    AROM Assessment Site  Cervical    Cervical Flexion  35    Cervical Extension  20    Cervical - Right Side Bend  30    Cervical - Left Side Bend  40    Cervical - Right Rotation  30    Cervical - Left Rotation  45      Strength   Overall Strength  Deficits    Overall Strength Comments  right hip 4-/5, left shoulder 4-/5 with pain in the shoulder           Vestibular Assessment - 01/02/18 1011      Vestibular Assessment   General Observation  reports general sense of imbalance      Symptom Behavior   Type of Dizziness  Imbalance    Frequency of Dizziness  daily    Duration of Dizziness  N/A    Aggravating Factors  Activity in general    Relieving Factors  Slow movements;Comments   holding furniture and walls     Occulomotor Exam   Occulomotor Alignment  Normal   R ptosis   Spontaneous  Absent    Gaze-induced  Left beating nystagmus with L gaze    Smooth Pursuits  Saccades    Saccades  Slow;Poor trajectory    Comment  Convergence: impaired but has implanted lenses and uses reading glasses          Objective measurements completed on examination: See above findings.  PT Education - 01/02/18 1126    Education provided  Yes    Education Details  clinical findings, PT POC, goals    Person(s) Educated  Patient    Methods  Explanation     Comprehension  Verbalized understanding       PT Short Term Goals - 01/02/18 1134      PT SHORT TERM GOAL #1   Title  Pt will participate in full vestibular assessment and will initiate vestibular HEP    Time  4    Period  Weeks    Status  New    Target Date  02/01/18      PT SHORT TERM GOAL #2   Title  Pt will participate in further balance assessment (gait velocity, FGA) and will initiate balance and strengthening HEP    Time  4    Period  Weeks    Status  New    Target Date  02/01/18      PT SHORT TERM GOAL #3   Title  Pt will demonstrate independence with initial cervical ROM and postural exercises for HEP    Time  4    Period  Weeks    Status  New    Target Date  02/01/18      PT SHORT TERM GOAL #4   Title  Pt will improve cervical ROM by 5-8 degrees and report </= 4/10 pain with movement    Time  4    Period  Weeks    Status  New    Target Date  02/01/18        PT Long Term Goals - 01/02/18 1138      PT LONG TERM GOAL #1   Title  Pt will be independent with vestibular, balance, neck/posture HEP    Time  8    Period  Weeks    Status  New    Target Date  03/03/18      PT LONG TERM GOAL #2   Title  Pt will demonstrate 10-12 deg increase in cervical spine ROM and </= 2/10 pain with movement    Time  8    Period  Weeks    Status  New    Target Date  03/03/18      PT LONG TERM GOAL #3   Title  Pt will improve gait velocity to >/= 3.0 ft/sec with no evidence of imbalance or needing to touch walls/furniture    Baseline  TBD    Time  8    Period  Weeks    Status  New    Target Date  03/03/18      PT LONG TERM GOAL #4   Title  Pt will improve FGA by 4 points to decrease falls risk during gait    Baseline  TBD    Time  8    Period  Weeks    Status  New    Target Date  03/03/18      PT LONG TERM GOAL #5   Title  Vestibular goal if indicated             Plan - 01/02/18 1130    Clinical Impression Statement  Pt is a 47 year old female referred  to Neuro OPPT for evaluation of dizziness and imbalance following 2 falls at home with mild concussion.  Pt's PMH is significant for the following: achilles tendonitis, ADHD, OA, bipolar disorder, cataracts bilaterally, eczema, HTN, lipoma with removal, migraine, personality disorder,  suicide ideation, L ankle surgery, R bunionectomy, R shoulder surgery and L thigh tumor resection.  Pt has participated in OT for shoulder pain as well as PT for hip pain in the past.  Pt continues to c/o hip and shoulder pain but these will not be primarily addressed with this episode of care.  Pt does hope to return to PT at [facility] to continue therapy for her hips after the holidays. The following deficits were noted during pt's exam: neck pain and impaired neck ROM, UE and LE weakness, impaired balance, and disequilibrium placing patient at increased risk for recurrent falls.  Pt would benefit from skilled PT to address these impairments and functional limitations to maximize functional mobility independence and reduce falls risk.    History and Personal Factors relevant to plan of care:  multiple episodes of therapy for shoulder pain, hip pain.  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), achilles tendonitis, ADHD, OA, bipolar disorder, cataracts bilaterally, eczema, HTN, lipoma with removal, migraine, personality disorder,  suicide ideation, L ankle surgery, R bunionectomy, R shoulder surgery and L thigh tumor resection    Clinical Decision Making  Moderate    Rehab Potential  Good    PT Frequency  2x / week    PT Duration  8 weeks    PT Treatment/Interventions  ADLs/Self Care Home Management;Cryotherapy;Lobbyist;Therapeutic exercise;Therapeutic activities;Functional mobility training;Patient/family education;Manual techniques;Canalith Repostioning;Moist Heat;Gait training;Neuromuscular re-education;Passive range of motion;Dry needling;Vestibular    PT Next Visit Plan  Did she talk to neurology about referral to Speech therapy?  continue to perform vestibular assessment, balance assessment (FGA, gait velocity) - update goals.  Initiate posture, c-spine ROM/strengthening, balance and vestibular exercises for home.  Would she be open to doing Dry needling of shoulder/neck mm?    Consulted and Agree with Plan of Care  Patient       Patient will benefit from skilled therapeutic intervention in order to improve the following deficits and impairments:  Decreased range of motion, Difficulty walking, Increased muscle spasms, Pain, Decreased balance, Impaired flexibility, Postural dysfunction, Decreased strength, Decreased mobility, Decreased cognition, Dizziness  Visit Diagnosis: Cervicalgia  Dizziness and giddiness  Unsteadiness on feet  Repeated falls     Problem List Patient Active Problem List   Diagnosis Date Noted  . Head injury with loss of consciousness ([place]) 11/20/2017  . Nausea 11/20/2017  . 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 Tremor observed on examination 11/20/2017  . Left shoulder pain 02/18/2017  . MDD (major depressive disorder), recurrent severe, without psychosis ([place]) 12/28/2016  . Muscle strain 09/19/2016  . Right foot injury, subsequent encounter 08/25/2016  . Low  back pain 06/27/2016  . Left wrist injury, subsequent encounter 06/27/2016  . Pain of left thumb 10/07/2015  . 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 Strain of left thumb 02/11/2015  . Strain of right forearm 02/11/2015  .  Right shoulder pain 12/31/2014  . Injury of right little finger 12/31/2014  . Episodic cluster headache, 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  . 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 Hypersomnia, recurrent 12/02/2014  . Migraine aura, persistent, intractable, with status migrainosus 12/02/2014  . Lower back injury 09/30/2014  . Bipolar I disorder, most recent episode depressed ([place])   . MDD (major depressive disorder), recurrent, severe, with psychosis ([place]) 09/23/2014  . Injury of left index finger 08/20/2014  . Right ankle sprain 06/01/2014  . Contusion, multiple sites 06/01/2014  . Strain of right gastrocnemius muscle 06/01/2014  . Phonophobia 05/04/2014  . Photophobia of both eyes 05/04/2014  . Emotionally unstable borderline personality disorder ([place]) 05/04/2014  . Nausea with vomiting 05/04/2014  . Mixed bipolar I disorder ([place])   . Bipolar I disorder, most recent episode mixed ([place]) 04/18/2014  . Bipolar affective disorder, depressed, mild ([place]) 04/12/2014  . Suicidal ideation 04/12/2014  . Injury of right shoulder and upper arm 02/17/2014  . Left leg pain 06/03/2013  . Right hip pain 06/03/2013  . Migraine with status migrainosus 01/08/2013  . Personality disorder ([place])   . Chronic migraine 05/08/2012  . Contact dermatitis 11/27/2011  . Major depressive disorder, recurrent episode ([place]) 10/27/2011  . Generalized anxiety disorder 10/27/2011  . 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 Borderline personality disorder ([place]) 10/27/2011  . Right foot pain 09/28/2011  . 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 Ganglion cyst 09/29/2009  . PES PLANUS 07/01/2008  . BIPOLAR DISORDER UNSPECIFIED 06/09/2008    Rico Junker, PT, DPT 01/02/18    11:42 AM    [facility] [address] [place], [place], [address] Phone: [phone]   Fax:  [phone]  Name: JAYLENN BAIZA MRN: [id number] Date of Birth: 07/31/1970

## 2018-01-08 ENCOUNTER — Telehealth: Payer: Self-pay

## 2018-01-08 ENCOUNTER — Ambulatory Visit: Payer: PPO

## 2018-01-08 DIAGNOSIS — R2681 Unsteadiness on feet: Secondary | ICD-10-CM

## 2018-01-08 DIAGNOSIS — R42 Dizziness and giddiness: Secondary | ICD-10-CM

## 2018-01-08 DIAGNOSIS — R4789 Other speech disturbances: Secondary | ICD-10-CM

## 2018-01-08 DIAGNOSIS — M542 Cervicalgia: Secondary | ICD-10-CM | POA: Diagnosis not present

## 2018-01-08 NOTE — Patient Instructions (Signed)
Gaze Stabilization: Tip Card  1.Target must remain in focus, not blurry, and appear stationary while head is in motion. 2.Perform exercises with small head movements (45 to either side of midline). 3.Increase speed of head motion so long as target is in focus. 4.If you wear eyeglasses, be sure you can see target through lens (therapist will give specific instructions for bifocal / progressive lenses). 5.These exercises may provoke dizziness or nausea. Work through these symptoms. If too dizzy, slow head movement slightly. Rest between each exercise. 6.Exercises demand concentration; avoid distractions. 7.For safety, perform standing exercises close to a counter, wall, corner, or next to someone.  Copyright  VHI. All rights reserved.    Gaze Stabilization: Sitting    Keeping eyes on target on wall 3-5 feet away, tilt head down 15-30 and move head side to side for __20-30__ seconds. Repeat while moving head up and down for __20-30__ seconds. Do __2__ sessions per day.   Copyright  VHI. All rights reserved.   

## 2018-01-08 NOTE — Therapy (Signed)
[facility] [address] [place], [place], [address] Phone: [phone]   Fax:  [phone]  Physical Therapy Treatment  Patient Details  Name: [name] MRN: [id number] Date of Birth: [birth year] Referring Provider (PT): Larey Seat, MD   Encounter Date: 01/08/2018  PT End of Session - 01/08/18 1150    Visit Number  2    Number of Visits  17    Date for PT Re-Evaluation  03/03/18    Authorization Type  $15 copay; [employer].  VL: follow Medicare guidelines.  10th visit PN    PT Start Time  0813   pt late   PT Stop Time  0847    PT Time Calculation (min)  34 min    Equipment Utilized During Treatment  --   min guard to S prn   Activity Tolerance  Patient tolerated treatment well    Behavior During Therapy  WFL for tasks assessed/performed       Past Medical History:  Diagnosis Date  . Achilles tendinitis   . Achilles tendinitis   . ADHD (attention deficit hyperactivity disorder)   . Allergy   . Arthritis   . Bipolar affective ([place])   . Bipolar disorder ([place])   . Cataracts, bilateral   . Eczema   . Ganglion cyst 09/29/2009   left wrist (2 cyst)  . Hyperprolactinemia ([place])   . Hypertension   . Lipoma   . Migraine   . Personality disorder ([place])   . Pes planus     Past Surgical History:  Procedure Laterality Date  . ANKLE SURGERY  12/88   left   . chest nodule  1990?   rt chest wall nodule removal  . GANGLION CYST EXCISION  2011  . lipoma removal    . right bunioectomy    . SHOULDER SURGERY  01/13/2011   right, partial tear  . tumor resection left thigh      There were no vitals filed for this visit.  Subjective Assessment - 01/08/18 0817    Subjective  Pt denied any falls or changes since last visit. However, pt reported several "trips" during amb. Pt has not spoken to neurologist about speech referral. Pt dropped OJ glass this morning, as she reports her pincher grip is not as  good.     Pertinent History  OT for shoulder pain, PT at [facility] for hip; multiple falls, achilles tendonitis, ADHD, OA, bipolar disorder, cataracts bilaterally, eczema, HTN, lipoma with removal, migraine, personality disorder, suicide ideation, L ankle surgery, R bunionectomy, R shoulder surgery and L thigh tumor resection    Diagnostic tests  CT was negative for acute changes, neurology has ordered and MRI - not performed yet    Patient Stated Goals  To be more steady and to improve neck ROM    Currently in Pain?  Yes    Pain Score  6     Pain Location  Shoulder    Pain Orientation  Left    Pain Descriptors / Indicators  Aching;Sore    Pain Type  Chronic pain    Pain Onset  More than a month ago    Pain Frequency  Intermittent    Aggravating Factors   lifting    Pain Relieving Factors  sitting, rest, heat             Vestibular Assessment - 01/08/18 0826      Vestibulo-Occular Reflex   VOR 1 Head Only (x  1 viewing)  Pt reported slight dizziness and had difficulty performing  due to " coordination issues".    VOR Cancellation  Normal      Visual Acuity   Static  8    Dynamic  3   Pt reported dizziness during DVA (6-7/10)     Positional Testing   Dix-Hallpike  Dix-Hallpike Right;Dix-Hallpike Left    Horizontal Canal Testing  Horizontal Canal Right;Horizontal Canal Left      Dix-Hallpike Right   Dix-Hallpike Right Duration  0    Dix-Hallpike Right Symptoms  No nystagmus      Dix-Hallpike Left   Dix-Hallpike Left Duration  0    Dix-Hallpike Left Symptoms  No nystagmus      Horizontal Canal Right   Horizontal Canal Right Duration  0    Horizontal Canal Right Symptoms  Normal      Horizontal Canal Left   Horizontal Canal Left Duration  0    Horizontal Canal Left Symptoms  Normal      Positional Sensitivities   Up from Right Hallpike  No dizziness    Up from Left Hallpike  No dizziness                Vestibular Treatment/Exercise - 01/08/18 0841       Vestibular Treatment/Exercise   Vestibular Treatment Provided  Gaze    Gaze Exercises  X1 Viewing Horizontal;X1 Viewing Vertical      X1 Viewing Horizontal   Foot Position  seated    Time  --   20-30   Reps  2    Comments  Cues and demo for technique. Please see pt instructions for HEP details.       X1 Viewing Vertical   Foot Position  Seated    Time  --   20-30 sec.    Reps  2    Comments  Cues and demo for technique.            Self Care: PT Education - 01/08/18 1147    Education provided  Yes    Education Details  PT discussed potential referral to speech therapy, pt agreeable but did not check with neurologist last visit. Pt agreeable to PT sending request to neurology office. PT provided pt with x1 viewing HEP. PT explained vestibular exam results and outcome measures. Pt also reported R grip strength/coordination is not as good since concussion.     Person(s) Educated  Patient    Methods  Explanation;Demonstration;Verbal cues;Handout    Comprehension  Returned demonstration;Verbalized understanding       PT Short Term Goals - 01/02/18 1134      PT SHORT TERM GOAL #1   Title  Pt will participate in full vestibular assessment and will initiate vestibular HEP    Time  4    Period  Weeks    Status  New    Target Date  02/01/18      PT SHORT TERM GOAL #2   Title  Pt will participate in further balance assessment (gait velocity, FGA) and will initiate balance and strengthening HEP    Time  4    Period  Weeks    Status  New    Target Date  02/01/18      PT SHORT TERM GOAL #3   Title  Pt will demonstrate independence with initial cervical ROM and postural exercises for HEP    Time  4    Period  Weeks    Status  New    Target Date  02/01/18      PT SHORT TERM GOAL #4   Title  Pt will improve cervical ROM by 5-8 degrees and report </= 4/10 pain with movement    Time  4    Period  Weeks    Status  New    Target Date  02/01/18        PT Long Term Goals  - 01/02/18 1138      PT LONG TERM GOAL #1   Title  Pt will be independent with vestibular, balance, neck/posture HEP    Time  8    Period  Weeks    Status  New    Target Date  03/03/18      PT LONG TERM GOAL #2   Title  Pt will demonstrate 10-12 deg increase in cervical spine ROM and </= 2/10 pain with movement    Time  8    Period  Weeks    Status  New    Target Date  03/03/18      PT LONG TERM GOAL #3   Title  Pt will improve gait velocity to >/= 3.0 ft/sec with no evidence of imbalance or needing to touch walls/furniture    Baseline  TBD    Time  8    Period  Weeks    Status  New    Target Date  03/03/18      PT LONG TERM GOAL #4   Title  Pt will improve FGA by 4 points to decrease falls risk during gait    Baseline  TBD    Time  8    Period  Weeks    Status  New    Target Date  03/03/18      PT LONG TERM GOAL #5   Title  Vestibular goal if indicated            Plan - 01/08/18 1151    Clinical Impression Statement  Pt vestibular exam was negative for positional vertigo. Pt's s/s consistent with post-concussion, including dizziness with VOR and motion sensitivity. Pt's SVA vs. DVA line difference was significant at 5 line difference and dizziness reported. Today's skilled session required incr. time to allow processing time. Pt would continue to benefit from skilled PT to improve safety during functional mobility.     Rehab Potential  Good    PT Frequency  2x / week    PT Duration  8 weeks    PT Treatment/Interventions  ADLs/Self Care Home Management;Cryotherapy;Lobbyist;Therapeutic exercise;Therapeutic activities;Functional mobility training;Patient/family education;Manual techniques;Canalith Repostioning;Moist Heat;Gait training;Neuromuscular re-education;Passive range of motion;Dry needling;Vestibular    PT Next Visit Plan  Continue balance assessment (FGA, gait velocity) - update goals.  Initiate posture, c-spine ROM/strengthening,  balance and vestibular exercises for home.  Would she be open to doing Dry needling of shoulder/neck mm?    Consulted and Agree with Plan of Care  Patient       Patient will benefit from skilled therapeutic intervention in order to improve the following deficits and impairments:  Decreased range of motion, Difficulty walking, Increased muscle spasms, Pain, Decreased balance, Impaired flexibility, Postural dysfunction, Decreased strength, Decreased mobility, Decreased cognition, Dizziness  Visit Diagnosis: Dizziness and giddiness  Unsteadiness on feet     Problem List Patient Active Problem List   Diagnosis Date Noted  . Head injury with loss of consciousness ([place]) 11/20/2017  . Nausea 11/20/2017  . 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 Tremor observed on examination 11/20/2017  . Left shoulder pain 02/18/2017  . MDD (major depressive disorder), recurrent severe, without psychosis ([place]) 12/28/2016  . Muscle strain 09/19/2016  . Right foot injury, subsequent encounter 08/25/2016  . Low back pain 06/27/2016  . Left wrist injury, subsequent encounter 06/27/2016  . Pain of left thumb 10/07/2015  . 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 Strain of left thumb 02/11/2015  . Strain of right forearm 02/11/2015  . Right shoulder pain 12/31/2014  . Injury of right little finger 12/31/2014  . Episodic cluster headache, 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  . 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 Hypersomnia, recurrent 12/02/2014  . Migraine aura, persistent, intractable, with status migrainosus 12/02/2014  . Lower back injury 09/30/2014  . Bipolar I disorder, most recent episode depressed ([place])   . MDD (major depressive disorder), recurrent, severe, with psychosis ([place]) 09/23/2014  . Injury of left index finger 08/20/2014  . Right ankle  sprain 06/01/2014  . Contusion, multiple sites 06/01/2014  . Strain of right gastrocnemius muscle 06/01/2014  . Phonophobia 05/04/2014  . Photophobia of both eyes 05/04/2014  . Emotionally unstable borderline personality disorder ([place]) 05/04/2014  . Nausea with vomiting 05/04/2014  . Mixed bipolar I disorder ([place])   . Bipolar I disorder, most recent episode mixed ([place]) 04/18/2014  . Bipolar affective disorder, depressed, mild ([place]) 04/12/2014  . Suicidal ideation 04/12/2014  . Injury of right shoulder and upper arm 02/17/2014  . Left leg pain 06/03/2013  . Right hip pain 06/03/2013  . Migraine with status migrainosus 01/08/2013  . Personality disorder ([place])   . Chronic migraine 05/08/2012  . Contact dermatitis 11/27/2011  . Major depressive disorder, recurrent episode ([place]) 10/27/2011  . Generalized anxiety disorder 10/27/2011  . 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 Borderline personality disorder ([place]) 10/27/2011  . Right foot pain 09/28/2011  . 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 Ganglion cyst 09/29/2009  . PES PLANUS 07/01/2008  . BIPOLAR DISORDER UNSPECIFIED 06/09/2008    Darshawn Boateng L 01/08/2018, 11:55 AM  [facility] [address] [place], [place], [address] Phone: [phone]   Fax:  [phone]  Name: FAITHLYN RECKTENWALD MRN: [id number] Date of Birth: [birth year]  Geoffry Paradise, PT,DPT 01/08/18 11:55 AM Phone: [phone] Fax: [phone]

## 2018-01-08 NOTE — Telephone Encounter (Signed)
Order placed. Thanks for the recommendation.

## 2018-01-08 NOTE — Telephone Encounter (Signed)
Dr. Clabe Seal  I saw Ms. Coombes for her PT appt. Today. She continues to c/o word finding issues and decr. Cognition. I feel she would benefit from a Speech referral. If you agree, please send [place] a Speech order.  Thank you, Geoffry Paradise, PT,DPT 01/08/18 12:24 PM Phone: [phone] Fax: [phone]

## 2018-01-11 ENCOUNTER — Ambulatory Visit: Payer: PPO

## 2018-01-15 ENCOUNTER — Ambulatory Visit: Payer: PPO

## 2018-01-15 ENCOUNTER — Ambulatory Visit (HOSPITAL_BASED_OUTPATIENT_CLINIC_OR_DEPARTMENT_OTHER)
Admission: RE | Admit: 2018-01-15 | Discharge: 2018-01-15 | Disposition: A | Discharge status: Home / Self Care | Payer: PPO | Source: Ambulatory Visit | Attending: Family Medicine | Admitting: Family Medicine

## 2018-01-15 ENCOUNTER — Encounter: Payer: Self-pay | Admitting: Family Medicine

## 2018-01-15 ENCOUNTER — Ambulatory Visit: Payer: PPO | Admitting: Family Medicine

## 2018-01-15 VITALS — BP 93/56 | HR 64 | Ht 70.0 in | Wt 250.0 lb

## 2018-01-15 DIAGNOSIS — M79671 Pain in right foot: Secondary | ICD-10-CM | POA: Diagnosis not present

## 2018-01-15 DIAGNOSIS — S99921A Unspecified injury of right foot, initial encounter: Secondary | ICD-10-CM

## 2018-01-15 DIAGNOSIS — R2681 Unsteadiness on feet: Secondary | ICD-10-CM

## 2018-01-15 DIAGNOSIS — M79604 Pain in right leg: Secondary | ICD-10-CM | POA: Diagnosis not present

## 2018-01-15 MED ORDER — NAPROXEN 500 MG PO TABS: 500.0000 mg | ORAL_TABLET | Freq: Two times a day (BID) | ORAL | 2 refills | Status: DC | PRN

## 2018-01-15 NOTE — Patient Instructions (Signed)
Your x-rays are reassuring - you sprained your foot, bruised your knee, and strained the muscles up your leg. Take naproxen 500mg  twice a day with food for 7-10 days then as needed. Consider boot when up and walking around for comfort. Icing for 2 more days then can switch to heat if more comfortable with this. Follow up with me in 4 weeks but you should feel better over next 2-4 weeks.
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
## 2018-01-16 ENCOUNTER — Encounter: Payer: Self-pay | Admitting: Family Medicine

## 2018-01-16 NOTE — Progress Notes (Signed)
PCP: Aretta Nip, MD  Subjective:   HPI: Patient is a 47 y.o. female here for right foot injury.  Patient reports yesterday on December 16 she was walking with a fast pace and accidentally kicked something. This caused immediate pain in the dorsal aspect of her right foot but also pain radiating up her right calf, leg to her hip. Pain is been throbbing in her foot at a 7 out of 10 level. She is used heat which helps some but does not resolve the pain. Not tried any medication for this yet. Associated bruising in the dorsal aspect of her foot but no numbness.  Past Medical History:  Diagnosis Date  . Achilles tendinitis   . Achilles tendinitis   . ADHD (attention deficit hyperactivity disorder)   . Allergy   . Arthritis   . Bipolar affective ([place])   . Bipolar disorder ([place])   . Cataracts, bilateral   . Eczema   . Ganglion cyst 09/29/2009   left wrist (2 cyst)  . Hyperprolactinemia ([place])   . Hypertension   . Lipoma   . Migraine   . Personality disorder ([place])   . Pes planus     Current Outpatient Medications on File Prior to Visit  Medication Sig Dispense Refill  . clobetasol cream (TEMOVATE) 0.05 %   3  . divalproex (DEPAKOTE ER) 500 MG 24 hr tablet TAKE 2 TABLETS BY MOUTH AT BEDTIME FOR MOOD CONTROL 60 tablet 0  . Galcanezumab-gnlm (EMGALITY) 120 MG/ML SOAJ Inject 120 mg into the skin every 30 (thirty) days. 1 mL 11  . hydrochlorothiazide (HYDRODIURIL) 25 MG tablet Take 25 mg by mouth as needed.  1  . hydrOXYzine (VISTARIL) 50 MG capsule TK ONE C PO TID PRA  2  . lamoTRIgine (LAMICTAL) 150 MG tablet Take 300 mg by mouth at bedtime.  1  . levocetirizine (XYZAL) 5 MG tablet Take 5 mg by mouth every evening.  1  . lurasidone (LATUDA) 80 MG TABS tablet Take 1 tablet (80 mg total) by mouth daily with supper. For mood control 30 tablet 0  . metoprolol succinate (TOPROL-XL) 100 MG 24 hr tablet Take 1 tablet (100 mg total) by mouth daily. Take with or immediately following  a meal. 30 tablet 0  . promethazine (PHENERGAN) 25 MG tablet TK 1 T PO BID FOR 15 DAYS PRN  0  . SUMAtriptan Succinate Refill 6 MG/0.5ML SOCT Inject 0.5 ml SQ at the onset of migraine. May repeat in 2 hours if needed. 0.5 mL 5  . traZODone (DESYREL) 50 MG tablet Take 1 tablet (50 mg total) by mouth at bedtime as needed for sleep. (Patient taking differently: Take 100 mg by mouth. ) 30 tablet 0   Current Facility-Administered Medications on File Prior to Visit  Medication Dose Route Frequency Provider Last Rate Last Dose  . methylPREDNISolone acetate (DEPO-MEDROL) injection 40 mg  40 mg Intra-articular Once Lorelei Heikkila, Sharyn Lull, MD      . valproate (DEPACON) 1,000 mg in dextrose 5 % 50 mL IVPB  1,000 mg Intravenous Once Dohmeier, Asencion Partridge, MD        Past Surgical History:  Procedure Laterality Date  . ANKLE SURGERY  12/88   left   . chest nodule  1990?   rt chest wall nodule removal  . GANGLION CYST EXCISION  2011  . lipoma removal    . right bunioectomy    . SHOULDER SURGERY  01/13/2011   right, partial tear  . tumor resection  left thigh      Allergies  Allergen Reactions  . Adhesive [Tape] Itching and Rash    Also reacted to Steri Strips and Band-Aids.  . Dilaudid [Hydromorphone Hcl] Itching  . Morphine Nausea And Vomiting  . Penicillins Hives    Has patient had a PCN reaction causing immediate rash, facial/tongue/throat swelling, SOB or lightheadedness with hypotension: YES Has patient had a PCN reaction causing severe rash involving mucus membranes or skin necrosis: NO Has patient had a PCN reaction that required hospitalization NO Has patient had a PCN reaction occurring within the last 10 years:NO If all of the above answers are "NO", then may proceed with Cephalosporin use.  Marland Kitchen Percocet [Oxycodone-Acetaminophen] Itching  . Prednisone Hives  . Provera [Medroxyprogesterone Acetate] Other (See Comments)    Causes manic episodes  . Ultram [Tramadol Hcl] Itching    Social  History   Socioeconomic History  . Marital status: Single    Spouse name: Not on file  . Number of children: 0  . Years of education: Not on file  . Highest education level: Not on file  Occupational History    Employer: [employer]  . Financial resource strain: Not on file  . Food insecurity:    Worry: Not on file    Inability: Not on file  . Transportation needs:    Medical: Not on file    Non-medical: Not on file  Tobacco Use  . Smoking status: Never Smoker  . Smokeless tobacco: Never Used  Substance and Sexual Activity  . Alcohol use: No    Alcohol/week: 0.0 standard drinks  . Drug use: No  . Sexual activity: Never    Birth control/protection: Pill  Lifestyle  . Physical activity:    Days per week: Not on file    Minutes per session: Not on file  . Stress: Not on file  Relationships  . Social connections:    Talks on phone: Not on file    Gets together: Not on file    Attends religious service: Not on file    Active member of club or organization: Not on file    Attends meetings of clubs or organizations: Not on file    Relationship status: Not on file  . Intimate partner violence:    Fear of current or ex partner: Not on file    Emotionally abused: Not on file    Physically abused: Not on file    Forced sexual activity: Not on file  Other Topics Concern  . Not on file  Social History Narrative   Caffeine  2 sodas daily, 1 cup coffee daily.    Family History  Problem Relation Age of Onset  . Hypertension Mother   . Hyperlipidemia Mother   . Breast cancer Mother   . Heart attack Father   . Heart disease Father   . Hypertension Father   . Bipolar disorder Father   . Diabetes Paternal Grandfather   . Heart disease Maternal Aunt   . Breast cancer Maternal Aunt   . Heart disease Maternal Grandmother   . Cancer Maternal Grandmother        colon    BP (!) 93/56   Pulse 64   Ht 5\' 10"  (1.778 m)   Wt 250 lb (113.4 kg)   LMP 01/01/2018   BMI  35.87 kg/m   Review of Systems: See HPI above.     Objective:  Physical Exam:  Gen: NAD, comfortable in exam room  Right foot/ankle: Mild swelling dorsal foot and bruising.  No other deformity. FROM digits and ankle. TTP throughout dorsal foot especially over metatarsals, less over ATFL. Negative ant drawer and talar tilt.   Negative syndesmotic compression. Thompsons test negative. NV intact distally.  Right knee/lower leg: No gross deformity, ecchymoses, swelling. Mild TTP medial and lateral joint lines, gastrocs. FROM with 5/5 strength. Negative ant/post drawers. Negative valgus/varus testing. Negative lachmans. Negative mcmurrays, apleys, patellar apprehension. NV intact distally.  Right hip: No deformity. FROM with 5/5 strength. No tenderness to palpation. NVI distally. Negative logroll.  Left leg: No deformity. FROM with 5/5 strength. No tenderness to palpation. NVI distally.  Assessment & Plan:  1. Right leg injury - independently reviewed radiographs and no evidence fracture.  Consistent with foot sprain, knee contusion, strain of right lower extremity muscles.  Reassured patient.  Naproxen for 7-10 days then as needed.  Consider boot for comfort.  Icing for 2 more days then as needed or can switch to heat.  F/u in 4 weeks.

## 2018-01-18 ENCOUNTER — Ambulatory Visit: Payer: PPO

## 2018-01-18 DIAGNOSIS — M542 Cervicalgia: Secondary | ICD-10-CM | POA: Diagnosis not present

## 2018-01-18 DIAGNOSIS — R2681 Unsteadiness on feet: Secondary | ICD-10-CM

## 2018-01-18 DIAGNOSIS — R42 Dizziness and giddiness: Secondary | ICD-10-CM

## 2018-01-18 NOTE — Therapy (Signed)
[facility] [address] [place], [place], [address] Phone: [phone]   Fax:  [phone]  Physical Therapy Treatment  Patient Details  Name: [name] MRN: [id number] Date of Birth: Apr 30, 1970 Referring Provider (PT): Larey Seat, MD   Encounter Date: 01/18/2018  PT End of Session - 01/18/18 0946    Visit Number  3    Number of Visits  17    Date for PT Re-Evaluation  03/03/18    Authorization Type  $15 copay; [employer].  VL: follow Medicare guidelines.  10th visit PN    PT Start Time  0850    PT Stop Time  0934    PT Time Calculation (min)  44 min    Equipment Utilized During Treatment  Gait belt    Activity Tolerance  Patient tolerated treatment well    Behavior During Therapy  WFL for tasks assessed/performed       Past Medical History:  Diagnosis Date  . Achilles tendinitis   . Achilles tendinitis   . ADHD (attention deficit hyperactivity disorder)   . Allergy   . Arthritis   . Bipolar affective ([place])   . Bipolar disorder ([place])   . Cataracts, bilateral   . Eczema   . Ganglion cyst 09/29/2009   left wrist (2 cyst)  . Hyperprolactinemia ([place])   . Hypertension   . Lipoma   . Migraine   . Personality disorder ([place])   . Pes planus     Past Surgical History:  Procedure Laterality Date  . ANKLE SURGERY  12/88   left   . chest nodule  1990?   rt chest wall nodule removal  . GANGLION CYST EXCISION  2011  . lipoma removal    . right bunioectomy    . SHOULDER SURGERY  01/13/2011   right, partial tear  . tumor resection left thigh      There were no vitals filed for this visit.  Subjective Assessment - 01/18/18 0854    Subjective  Pt reported she saw MD regarding R foot pain and pt was cleared for Fx but was told she has a sprain. MD was supposed to send a note, as pt is cleared for PT. Pt denied falls since last visit.     Pertinent History  OT for shoulder pain, PT at [facility] for hip; multiple falls, achilles tendonitis, ADHD, OA, bipolar disorder, cataracts bilaterally, eczema, HTN, lipoma with removal, migraine, personality disorder, suicide ideation, L ankle surgery, R bunionectomy, R shoulder surgery and L thigh tumor resection    Diagnostic tests  CT was negative for acute changes, neurology has ordered and MRI - not performed yet    Patient Stated Goals  To be more steady and to improve neck ROM    Currently in Pain?  Yes    Pain Score  4     Pain Location  Foot    Pain Orientation  Right    Pain Descriptors / Indicators  Sore    Pain Onset  In the past 7 days    Pain Frequency  Intermittent    Aggravating Factors   standing for prolonged periods of time (hours)    Pain Relieving Factors  rest, elevate, ice and heat         [facility] PT Assessment - 01/18/18 0856      Functional Gait  Assessment   Gait assessed   Yes    Gait Level Surface  Walks 20  ft, slow speed, abnormal gait pattern, evidence for imbalance or deviates 10-15 in outside of the 12 in walkway width. Requires more than 7 sec to ambulate 20 ft.   7.12 sec.   Change in Gait Speed  Makes only minor adjustments to walking speed, or accomplishes a change in speed with significant gait deviations, deviates 10-15 in outside the 12 in walkway width, or changes speed but loses balance but is able to recover and continue walking.   difficulty accelerating   Gait with Horizontal Head Turns  Performs head turns smoothly with slight change in gait velocity (eg, minor disruption to smooth gait path), deviates 6-10 in outside 12 in walkway width, or uses an assistive device.    Gait with Vertical Head Turns  Performs task with moderate change in gait velocity, slows down, deviates 10-15 in outside 12 in walkway width but recovers, can continue to walk.    Gait and Pivot Turn  Pivot turns safely in greater than 3 sec and stops with no loss of balance, or pivot turns safely within 3 sec and stops with mild  imbalance, requires small steps to catch balance.    Step Over Obstacle  Is able to step over one shoe box (4.5 in total height) but must slow down and adjust steps to clear box safely. May require verbal cueing.    Gait with Narrow Base of Support  Ambulates less than 4 steps heel to toe or cannot perform without assistance.    Gait with Eyes Closed  Walks 20 ft, slow speed, abnormal gait pattern, evidence for imbalance, deviates 10-15 in outside 12 in walkway width. Requires more than 9 sec to ambulate 20 ft.    Ambulating Backwards  Walks 20 ft, slow speed, abnormal gait pattern, evidence for imbalance, deviates 10-15 in outside 12 in walkway width.    Steps  Two feet to a stair, must use rail.   2 feet to stair intermittently, 2 handrails   Total Score  11    FGA comment:  11/30: indicates pt is at high risk for falls. Pt reported lightheadedness and unsteadiness during testing.        Neuro re-ed:  Gaze Stabilization: Sitting  Keeping eyes on target on wall 3-5 feet away, tilt head down 15-30 and move head side to side for __20-30__ seconds. Repeat while moving head up and down for _20-30___ seconds. Do __2__ sessions per day. Cues to rotate head to Left and right side (pt rotating towards R side > L side).  Copyright  VHI. All rights reserved.    Access Code: [email or web address]  URL: [email or web address]  Date: 01/18/2018  Prepared by: Geoffry Paradise   Exercises  Romberg Stance with Head Nods - 2-3 reps - 1 sets - 1x daily - 5x weekly  Romberg Stance Eyes Closed on Foam Pad - 2-3 reps - 1 sets - 10-30 SECONDS hold - 1x daily - 5x weekly  Backward Walking with Counter Support - 4 reps - 1 sets - 1x daily - 5x weekly  Walking with Head Rotation - 4 reps - 1 sets - 1x daily - 5x weekly   Pt performed at corner and counter with intermittent UE support prn. Min guard to S for safety. Cues and demo for technique. Pt also performed L SLS x 10sec. With 1 UE support-not  provided in HEP 2/2 R foot pain. Pt performed all activities with feet apart/together.  PT Education - 01/18/18 0944    Education provided  Yes    Education Details  PT provided pt with balance HEP and reviewed x1 viewing HEP. PT educated on FGA results and falls risk. PT inormed pt that Speech referral received and eval can be scheduled.    Person(s) Educated  Patient    Methods  Explanation;Demonstration;Verbal cues;Handout    Comprehension  Verbalized understanding;Returned demonstration;Need further instruction       PT Short Term Goals - 01/18/18 0950      PT SHORT TERM GOAL #1   Title  Pt will participate in full vestibular assessment and will initiate vestibular HEP    Time  4    Period  Weeks    Status  New      PT SHORT TERM GOAL #2   Title  Pt will participate in further balance assessment (gait velocity, FGA) and will initiate balance and strengthening HEP    Time  4    Period  Weeks    Status  New      PT SHORT TERM GOAL #3   Title  Pt will demonstrate independence with initial cervical ROM and postural exercises for HEP    Time  4    Period  Weeks    Status  New      PT SHORT TERM GOAL #4   Title  Pt will improve cervical ROM by 5-8 degrees and report </= 4/10 pain with movement    Time  4    Period  Weeks    Status  New      PT SHORT TERM GOAL #5   Title  Pt will improve FGA score to >/=15/30 to decr. falls risk.     Status  New        PT Long Term Goals - 01/18/18 0950      PT LONG TERM GOAL #1   Title  Pt will be independent with vestibular, balance, neck/posture HEP    Time  8    Period  Weeks    Status  New      PT LONG TERM GOAL #2   Title  Pt will demonstrate 10-12 deg increase in cervical spine ROM and </= 2/10 pain with movement    Time  8    Period  Weeks    Status  New      PT LONG TERM GOAL #3   Title  Pt will improve gait velocity to >/= 3.0 ft/sec with no evidence of imbalance or needing to  touch walls/furniture    Baseline  TBD    Time  8    Period  Weeks    Status  New      PT LONG TERM GOAL #4   Title  Pt will improve FGA by 8 points to decrease falls risk during gait    Baseline  11/30 on 01/18/18    Time  8    Period  Weeks    Status  Revised      PT LONG TERM GOAL #5   Title  Vestibular goal if indicated            Plan - 01/18/18 0947    Clinical Impression Statement  Pt's FGA score indicates pt is at a high risk for falls. Pt reported concordant dizziness and unsteadiness during FGA. Incr. postural sway noted during activities which required incr. vestibular input. Therefore, PT established balance HEP to improve vestibular input. Pt denied incr.  foot pain during activities. Continue with POC.     Rehab Potential  Good    PT Frequency  2x / week    PT Duration  8 weeks    PT Treatment/Interventions  ADLs/Self Care Home Management;Cryotherapy;Lobbyist;Therapeutic exercise;Therapeutic activities;Functional mobility training;Patient/family education;Manual techniques;Canalith Repostioning;Moist Heat;Gait training;Neuromuscular re-education;Passive range of motion;Dry needling;Vestibular    PT Next Visit Plan  Check gait velocity - update goals.  Initiate posture, c-spine ROM/strengthening, Would she be open to doing Dry needling of shoulder/neck mm?    Consulted and Agree with Plan of Care  Patient       Patient will benefit from skilled therapeutic intervention in order to improve the following deficits and impairments:  Decreased range of motion, Difficulty walking, Increased muscle spasms, Pain, Decreased balance, Impaired flexibility, Postural dysfunction, Decreased strength, Decreased mobility, Decreased cognition, Dizziness  Visit Diagnosis: Unsteadiness on feet  Dizziness and giddiness     Problem List Patient Active Problem List   Diagnosis Date Noted  . Head injury with loss of consciousness ([place]) 11/20/2017  .  Nausea 11/20/2017  . 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 Tremor observed on examination 11/20/2017  . Left shoulder pain 02/18/2017  . MDD (major depressive disorder), recurrent severe, without psychosis ([place]) 12/28/2016  . Muscle strain 09/19/2016  . Right foot injury, subsequent encounter 08/25/2016  . Low back pain 06/27/2016  . Left wrist injury, subsequent encounter 06/27/2016  . Pain of left thumb 10/07/2015  . 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 Strain of left thumb 02/11/2015  . Strain of right forearm 02/11/2015  . Right shoulder pain 12/31/2014  . Injury of right little finger 12/31/2014  . Episodic cluster headache, 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  . 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 Hypersomnia, recurrent 12/02/2014  . Migraine aura, persistent, intractable, with status migrainosus 12/02/2014  . Lower back injury 09/30/2014  . Bipolar I disorder, most recent episode depressed ([place])   . MDD (major depressive disorder), recurrent, severe, with psychosis ([place]) 09/23/2014  . Injury of left index finger 08/20/2014  . Right ankle sprain 06/01/2014  . Contusion, multiple sites 06/01/2014  . Strain of right gastrocnemius muscle 06/01/2014  . Phonophobia 05/04/2014  . Photophobia of both eyes 05/04/2014  . Emotionally unstable borderline personality disorder ([place]) 05/04/2014  . Nausea with vomiting 05/04/2014  . Mixed bipolar I disorder ([place])   . Bipolar I disorder, most recent episode mixed ([place]) 04/18/2014  . Bipolar affective disorder, depressed, mild ([place]) 04/12/2014  . Suicidal ideation 04/12/2014  . Injury of right shoulder and upper arm 02/17/2014  . Left leg pain 06/03/2013  . Right hip pain 06/03/2013  . Migraine with status migrainosus 01/08/2013  . Personality disorder ([place])   . Chronic migraine  05/08/2012  . Contact dermatitis 11/27/2011  . Major depressive disorder, recurrent episode ([place]) 10/27/2011  . Generalized anxiety disorder 10/27/2011  . 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 Borderline personality disorder ([place]) 10/27/2011  . Right foot pain 09/28/2011  . 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 Ganglion cyst 09/29/2009  . PES PLANUS 07/01/2008  . BIPOLAR DISORDER UNSPECIFIED 06/09/2008    Lambert Jeanty L 01/18/2018, 9:51 AM  [facility] [address] [place], [place], [address] Phone: [phone]   Fax:  [phone]  Name:  [name] MRN: [id number] Date of Birth: 1970-10-14  Geoffry Paradise, PT,DPT 01/18/18 9:53 AM Phone: [phone] Fax: [phone]

## 2018-01-18 NOTE — Patient Instructions (Signed)
Gaze Stabilization: Tip Card  1.Target must remain in focus, not blurry, and appear stationary while head is in motion. 2.Perform exercises with small head movements (45 to either side of midline). 3.Increase speed of head motion so long as target is in focus. 4.If you wear eyeglasses, be sure you can see target through lens (therapist will give specific instructions for bifocal / progressive lenses). 5.These exercises may provoke dizziness or nausea. Work through these symptoms. If too dizzy, slow head movement slightly. Rest between each exercise. 6.Exercises demand concentration; avoid distractions. 7.For safety, perform standing exercises close to a counter, wall, corner, or next to someone.  Copyright  VHI. All rights reserved.    Gaze Stabilization: Sitting    Keeping eyes on target on wall 3-5 feet away, tilt head down 15-30 and move head side to side for __20-30__ seconds. Repeat while moving head up and down for _20-30___ seconds. Do __2__ sessions per day.  Copyright  VHI. All rights reserved.    Access Code: [email or web address]  URL: [email or web address]  Date: 01/18/2018  Prepared by: Geoffry Paradise   Exercises  Romberg Stance with Head Nods - 2-3 reps - 1 sets - 1x daily - 5x weekly  Romberg Stance Eyes Closed on Foam Pad - 2-3 reps - 1 sets - 10-30 SECONDS hold - 1x daily - 5x weekly  Backward Walking with Counter Support - 4 reps - 1 sets - 1x daily - 5x weekly  Walking with Head Rotation - 4 reps - 1 sets - 1x daily - 5x weekly

## 2018-01-20 ENCOUNTER — Other Ambulatory Visit: Payer: Self-pay | Admitting: Neurology

## 2018-01-22 ENCOUNTER — Ambulatory Visit: Payer: PPO

## 2018-01-22 DIAGNOSIS — M6281 Muscle weakness (generalized): Secondary | ICD-10-CM

## 2018-01-22 DIAGNOSIS — R41841 Cognitive communication deficit: Secondary | ICD-10-CM

## 2018-01-22 DIAGNOSIS — R4701 Aphasia: Secondary | ICD-10-CM

## 2018-01-22 DIAGNOSIS — M542 Cervicalgia: Secondary | ICD-10-CM | POA: Diagnosis not present

## 2018-01-22 DIAGNOSIS — R42 Dizziness and giddiness: Secondary | ICD-10-CM

## 2018-01-22 NOTE — Therapy (Addendum)
[facility] [address] [place], [place], [address] Phone: [phone]   Fax:  [phone]  Physical Therapy Treatment  Patient Details  Name: [name] MRN: [id number] Date of Birth: 1970-05-02 Referring Provider (PT): Larey Seat, MD   Encounter Date: 01/22/2018  PT End of Session - 01/22/18 0846    Visit Number  4    Number of Visits  17    Date for PT Re-Evaluation  03/03/18    Authorization Type  $15 copay; [employer].  VL: follow Medicare guidelines.  10th visit PN    PT Start Time  0804    PT Stop Time  0843    PT Time Calculation (min)  39 min    Activity Tolerance  Patient tolerated treatment well    Behavior During Therapy  [facility] for tasks assessed/performed       Past Medical History:  Diagnosis Date  . Achilles tendinitis   . Achilles tendinitis   . ADHD (attention deficit hyperactivity disorder)   . Allergy   . Arthritis   . Bipolar affective ([place])   . Bipolar disorder ([place])   . Cataracts, bilateral   . Eczema   . Ganglion cyst 09/29/2009   left wrist (2 cyst)  . Hyperprolactinemia ([place])   . Hypertension   . Lipoma   . Migraine   . Personality disorder ([place])   . Pes planus     Past Surgical History:  Procedure Laterality Date  . ANKLE SURGERY  12/88   left   . chest nodule  1990?   rt chest wall nodule removal  . GANGLION CYST EXCISION  2011  . lipoma removal    . right bunioectomy    . SHOULDER SURGERY  01/13/2011   right, partial tear  . tumor resection left thigh      There were no vitals filed for this visit.  Subjective Assessment - 01/22/18 0808    Subjective  Pt is fatigued as she worked the last 3 days at [employer]. Pt reported R foot is getting better but is still sore. Pt hasn't been performing HEP as often due to busy work schedule.     Pertinent History  OT for shoulder pain, PT at [facility] for hip; multiple falls, achilles tendonitis, ADHD, OA, bipolar  disorder, cataracts bilaterally, eczema, HTN, lipoma with removal, migraine, personality disorder, suicide ideation, L ankle surgery, R bunionectomy, R shoulder surgery and L thigh tumor resection    Patient Stated Goals  To be more steady and to improve neck ROM    Currently in Pain?  No/denies                       [facility] Adult PT Treatment/Exercise - 01/22/18 0811      Ambulation/Gait   Ambulation/Gait  Yes    Ambulation/Gait Assistance  5: Supervision    Ambulation/Gait Assistance Details  S for safety 2/2 impaired balance and R foot pain.     Ambulation Distance (Feet)  100 Feet    Assistive device  None    Gait Pattern  Step-through pattern;Decreased stride length;Decreased stance time - right    Ambulation Surface  Level;Indoor    Gait velocity  3.41ft/sec. no AD      Vestibular Treatment/Exercise - 01/22/18 0815      Vestibular Treatment/Exercise   Vestibular Treatment Provided  Gaze    Gaze Exercises  X1 Viewing Horizontal;X1 Viewing Vertical  X1 Viewing Horizontal   Foot Position  seated    Time  --   20-30 sec.    Reps  2    Comments  Cues and demo for technique.      X1 Viewing Vertical   Foot Position  Seated    Time  --   20-30 sec.    Reps  2    Comments  Cues and demo for technique. Slow down and decr. ROM, so target does not move.         Neuro re-ed: Access Code: [email or web address]  URL: [email or web address]  Date: 01/22/2018  Prepared by: Geoffry Paradise   Exercises  Romberg Stance with Head Nods - 2-3 reps - 1 sets - 1x daily - 5x weekly (REVIEWED) Romberg Stance Eyes Closed on Foam Pad - 2-3 reps - 1 sets - 10-30 SECONDS hold - 1x daily - 5x weekly (REVIEWED) Backward Walking with Counter Support - 4 reps - 1 sets - 1x daily - 5x weekly (REVIEWED) Walking with Head Rotation - 4 reps - 1 sets - 1x daily - 5x weekly  (REVIEWED) Seated Scapular Retraction - 10 reps - 3 sets - 1x daily - 3x weekly (NMR) with green theraband.   Correct Seated Posture - 1x daily - 7x weekly (NMR) Supine Chin Tuck - 5 reps - 2 sets - 5 hold - 1x daily - 7x weekly (NMR)   Performed in supine and seated positions with cues and demo for technique.       Self Care: PT Education - 01/22/18 0845    Education provided  Yes    Education Details  PT reviewed previous HEP (performed x1 viewing and reviewed balance HEP on Medbridge). PT added posture exercises to HEP. PT also encouraged pt to find a therapist while her therapist is out of town, as she's feeling stressed with the holidays and a situation with her mom.     Person(s) Educated  Patient    Methods  Explanation;Demonstration;Tactile cues;Handout;Verbal cues    Comprehension  Returned demonstration;Verbalized understanding       PT Short Term Goals - 01/22/18 0848      PT SHORT TERM GOAL #1   Title  Pt will participate in full vestibular assessment and will initiate vestibular HEP    Time  4    Period  Weeks    Status  New      PT SHORT TERM GOAL #2   Title  Pt will participate in further balance assessment (gait velocity, FGA) and will initiate balance and strengthening HEP    Time  4    Period  Weeks    Status  Deferred      PT SHORT TERM GOAL #3   Title  Pt will demonstrate independence with initial cervical ROM and postural exercises for HEP    Time  4    Period  Weeks    Status  New      PT SHORT TERM GOAL #4   Title  Pt will improve cervical ROM by 5-8 degrees and report </= 4/10 pain with movement    Time  4    Period  Weeks    Status  New      PT SHORT TERM GOAL #5   Title  Pt will improve FGA score to >/=15/30 to decr. falls risk.     Status  New        PT Long Term Goals - 01/18/18 2505  PT LONG TERM GOAL #1   Title  Pt will be independent with vestibular, balance, neck/posture HEP    Time  8    Period  Weeks    Status  New      PT LONG TERM GOAL #2   Title  Pt will demonstrate 10-12 deg increase in cervical spine ROM and </= 2/10  pain with movement    Time  8    Period  Weeks    Status  New      PT LONG TERM GOAL #3   Title  Pt will improve gait velocity to >/= 3.0 ft/sec with no evidence of imbalance or needing to touch walls/furniture    Baseline  TBD    Time  8    Period  Weeks    Status  New      PT LONG TERM GOAL #4   Title  Pt will improve FGA by 8 points to decrease falls risk during gait    Baseline  11/30 on 01/18/18    Time  8    Period  Weeks    Status  Revised      PT LONG TERM GOAL #5   Title  Vestibular goal if indicated            Plan - 01/22/18 0826    Clinical Impression Statement  Today's skilled session focused on improving posture and cervical/thoracic spine muscular strength. Gait speed WNL. PT also reviewed x1 viewing and balance HEP as pt reported she had some questions as she wasn't able to perform daily 2/2 work schedule. Pt reported slight dizziness with x1 viewing but tolerable. No incr. in pain during session. Continue with POC.     Rehab Potential  Good    PT Frequency  2x / week    PT Duration  8 weeks    PT Treatment/Interventions  ADLs/Self Care Home Management;Cryotherapy;Lobbyist;Therapeutic exercise;Therapeutic activities;Functional mobility training;Patient/family education;Manual techniques;Canalith Repostioning;Moist Heat;Gait training;Neuromuscular re-education;Passive range of motion;Dry needling;Vestibular    PT Next Visit Plan   Initiate c-spine ROM/strengthening, Would she be open to doing Dry needling of shoulder/neck mm?    PT Home Exercise Plan  [email or web address]    Consulted and Agree with Plan of Care  Patient       Patient will benefit from skilled therapeutic intervention in order to improve the following deficits and impairments:  Decreased range of motion, Difficulty walking, Increased muscle spasms, Pain, Decreased balance, Impaired flexibility, Postural dysfunction, Decreased strength, Decreased mobility, Decreased cognition,  Dizziness  Visit Diagnosis: Muscle weakness (generalized)  Cervicalgia  Dizziness and giddiness     Problem List Patient Active Problem List   Diagnosis Date Noted  . Head injury with loss of consciousness ([place]) 11/20/2017  . Nausea 11/20/2017  . 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 Tremor observed on examination 11/20/2017  . Left shoulder pain 02/18/2017  . MDD (major depressive disorder), recurrent severe, without psychosis ([place]) 12/28/2016  . Muscle strain 09/19/2016  . Right foot injury, subsequent encounter 08/25/2016  . Low back pain 06/27/2016  . Left wrist injury, subsequent encounter 06/27/2016  . Pain of left thumb 10/07/2015  . 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 Strain of left thumb 02/11/2015  . Strain of right forearm 02/11/2015  . Right shoulder pain 12/31/2014  . Injury of right little finger 12/31/2014  . Episodic cluster headache, 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  . Chronic  paroxysmal hemicrania, 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  . 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 Hypersomnia, recurrent 12/02/2014  . Migraine aura, persistent, intractable, with status migrainosus 12/02/2014  . Lower back injury 09/30/2014  . Bipolar I disorder, most recent episode depressed ([place])   . MDD (major depressive disorder), recurrent, severe, with psychosis ([place]) 09/23/2014  . Injury of left index finger 08/20/2014  . Right ankle sprain 06/01/2014  . Contusion, multiple sites 06/01/2014  . Strain of right gastrocnemius muscle 06/01/2014  . Phonophobia 05/04/2014  . Photophobia of both eyes 05/04/2014  . Emotionally unstable borderline personality disorder ([place]) 05/04/2014  . Nausea with vomiting 05/04/2014  . Mixed bipolar I disorder ([place])   . Bipolar I disorder, most recent episode mixed ([place]) 04/18/2014  . Bipolar affective disorder, depressed, mild ([place]) 04/12/2014  .  Suicidal ideation 04/12/2014  . Injury of right shoulder and upper arm 02/17/2014  . Left leg pain 06/03/2013  . Right hip pain 06/03/2013  . Migraine with status migrainosus 01/08/2013  . Personality disorder ([place])   . Chronic migraine 05/08/2012  . Contact dermatitis 11/27/2011  . Major depressive disorder, recurrent episode ([place]) 10/27/2011  . Generalized anxiety disorder 10/27/2011  . 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 Borderline personality disorder ([place]) 10/27/2011  . Right foot pain 09/28/2011  . 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) 09/02/2010  . Ganglion cyst 09/29/2009  . PES PLANUS 07/01/2008  . BIPOLAR DISORDER UNSPECIFIED 06/09/2008    , L 01/22/2018, 8:49 AM  [facility] [address] [place], [place], [address] Phone: [phone]   Fax:  [phone]  Name: [name] MRN: [id number] Date of Birth: 26-Nov-1970  Geoffry Paradise, PT,DPT 01/22/18 8:50 AM Phone: [phone] Fax: [phone]

## 2018-01-22 NOTE — Patient Instructions (Signed)
Access Code: [email or web address]  URL: [email or web address]  Date: 01/22/2018  Prepared by: Geoffry Paradise   Exercises  Romberg Stance with Head Nods - 2-3 reps - 1 sets - 1x daily - 5x weekly (REVIEWED) Romberg Stance Eyes Closed on Foam Pad - 2-3 reps - 1 sets - 10-30 SECONDS hold - 1x daily - 5x weekly (REVIEWED) Backward Walking with Counter Support - 4 reps - 1 sets - 1x daily - 5x weekly (REVIEWED) Walking with Head Rotation - 4 reps - 1 sets - 1x daily - 5x weekly  (REVIEWED) Seated Scapular Retraction - 10 reps - 3 sets - 1x daily - 3x weekly  Correct Seated Posture - 1x daily - 7x weekly  Supine Chin Tuck - 5 reps - 2 sets - 5 hold - 1x daily - 7x weekly

## 2018-01-22 NOTE — Patient Instructions (Signed)
   SEMANTIC "WEB" with:  - - [employer]  - - [place]

## 2018-01-22 NOTE — Therapy (Signed)
[facility] [address] [place], [place], [address] Phone: [phone]   Fax:  [phone]  Speech Language Pathology Evaluation  Patient Details  Name: [name] MRN: [id number] Date of Birth: 04/25/1970 Referring Provider (SLP): Seymour Bars, NP   Encounter Date: 01/22/2018  End of Session - 01/22/18 1139    Visit Number  1    Number of Visits  17    Date for SLP Re-Evaluation  04/19/18   90 days   SLP Start Time  0850    SLP Stop Time   0934    SLP Time Calculation (min)  44 min    Activity Tolerance  Patient tolerated treatment well       Past Medical History:  Diagnosis Date  . Achilles tendinitis   . Achilles tendinitis   . ADHD (attention deficit hyperactivity disorder)   . Allergy   . Arthritis   . Bipolar affective ([place])   . Bipolar disorder ([place])   . Cataracts, bilateral   . Eczema   . Ganglion cyst 09/29/2009   left wrist (2 cyst)  . Hyperprolactinemia ([place])   . Hypertension   . Lipoma   . Migraine   . Personality disorder ([place])   . Pes planus     Past Surgical History:  Procedure Laterality Date  . ANKLE SURGERY  12/88   left   . chest nodule  1990?   rt chest wall nodule removal  . GANGLION CYST EXCISION  2011  . lipoma removal    . right bunioectomy    . SHOULDER SURGERY  01/13/2011   right, partial tear  . tumor resection left thigh      There were no vitals filed for this visit.  Subjective Assessment - 01/22/18 0826    Subjective  "(The word finding difficulty) is really frustrating."    Currently in Pain?  No/denies         SLP Evaluation [facility] - 01/22/18 0623      SLP Visit Information   SLP Received On  01/22/18    Referring Provider (SLP)  Seymour Bars, NP    Onset Date  September 2019    Medical Diagnosis  post-concussive syndrome      Subjective   Subjective  "I have to stop and think about what I'm saying when I'm talking to customers (at  [employer] [facility] [employer]) in order to talk to them."    Patient/Family Stated Goal  "Be able to collect my thougths more, and not ahve so many stops and starts with people."      General Information   HPI  Pt 47 yo female with involved PMH most lately with fall strking shelving in September 2019 now c/o word finding difficulty and cognitive changes.       Balance Screen   Has the patient fallen in the past 6 months  --   currently in PT     Prior Functional Status   Cognitive/Linguistic Baseline  Within functional limits    Education  graduated college    Vocation  Part time employment      Verbal Expression   Overall Verbal Expression  Appears within functional limits for tasks assessed    Naming  Impairment      Oral Motor/Sensory Function   Overall Oral Motor/Sensory Function  Appears within functional limits for tasks assessed      Motor Speech   Overall Motor Speech  Appears within functional limits for tasks assessed  Standardized Assessments   Standardized Assessments   [place] Naming Test-2nd edition    [place] Naming Test-2nd edition   48/60 (> 2 standard devaiations below the mean)                      SLP Education - 01/22/18 1137    Education Details  eval results, "semantic web", and why this may likely be helpful for pt    Person(s) Educated  Patient    Methods  Explanation    Comprehension  Verbalized understanding       SLP Short Term Goals - 01/22/18 1143      SLP SHORT TERM GOAL #1   Title  pt will complete cognitive linguistic testing in the first 2 therapy sessions    Time  2    Period  --   visits   Status  New      SLP SHORT TERM GOAL #2   Title  pt will name average 14 items in common categories in 60 seconds    Time  4    Period  Weeks    Status  New      SLP SHORT TERM GOAL #3   Title  pt will successfully use at least 2 compensatory strategies for anomia multiple times in 10 mintues of conversation    Time  4    Period  Weeks    Status   New       SLP Long Term Goals - 01/22/18 1147      SLP LONG TERM GOAL #1   Title  pt will improve her [place] Naming Test score to at least 51/60 on her last day of therapy    Time  8    Period  Weeks   17 sessions, for all LTGs   Status  New      SLP LONG TERM GOAL #2   Title  pt will use anomia compensations successfully in 15 minutes simple to mod complex conversation over 3 therapy sessions    Time  8    Period  Weeks    Status  New       Plan - 01/22/18 1139    Clinical Impression Statement  Pt presents today with anomia measured on the [facility] Naming Test -2 as 48/60, which is greater than 2 standard deviations below the mean for naming ability. Pt also c/o cognitive changes such as memory - cognitive communciation skills will be formally evaluated in the first two therapy sessions. Pt reports it is "very frustrating" to her because of this deficit, and it affects her work and personal life daily. She would benefit from skillled ST targeting improved verbal fluency as well as any cognitive communication deficits ID'd in formal cognitive linguistic testing.     Speech Therapy Frequency  2x / week    Duration  --   8 weeks/17 sessions   Treatment/Interventions  Language facilitation;Environmental controls;Cueing hierarchy;SLP instruction and feedback;Compensatory strategies;Cognitive reorganization;Functional tasks;Internal/external aids;Multimodal communcation approach;Patient/family education    Potential to Achieve Goals  Good    Consulted and Agree with Plan of Care  Patient       Patient will benefit from skilled therapeutic intervention in order to improve the following deficits and impairments:   Aphasia  Cognitive communication deficit    Problem List Patient Active Problem List   Diagnosis Date Noted  . Head injury with loss of consciousness ([place]) 11/20/2017  . Nausea 11/20/2017  . 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  Intractable persistent migraine aura with cerebral  infarction and status migrainosus ([place]) 11/20/2017  . Tremor observed on examination 11/20/2017  . Left shoulder pain 02/18/2017  . MDD (major depressive disorder), recurrent severe, without psychosis ([place]) 12/28/2016  . Muscle strain 09/19/2016  . Right foot injury, subsequent encounter 08/25/2016  . Low back pain 06/27/2016  . Left wrist injury, subsequent encounter 06/27/2016  . Pain of left thumb 10/07/2015  . 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 Strain of left thumb 02/11/2015  . Strain of right forearm 02/11/2015  . Right shoulder pain 12/31/2014  . Injury of right little finger 12/31/2014  . Episodic cluster headache, 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  . 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 Hypersomnia, recurrent 12/02/2014  . Migraine aura, persistent, intractable, with status migrainosus 12/02/2014  . Lower back injury 09/30/2014  . Bipolar I disorder, most recent episode depressed ([place])   . MDD (major depressive disorder), recurrent, severe, with psychosis ([place]) 09/23/2014  . Injury of left index finger 08/20/2014  . Right ankle sprain 06/01/2014  . Contusion, multiple sites 06/01/2014  . Strain of right gastrocnemius muscle 06/01/2014  . Phonophobia 05/04/2014  . Photophobia of both eyes 05/04/2014  . Emotionally unstable borderline personality disorder ([place]) 05/04/2014  . Nausea with vomiting 05/04/2014  . Mixed bipolar I disorder ([place])   . Bipolar I disorder, most recent episode mixed ([place]) 04/18/2014  . Bipolar affective disorder, depressed, mild ([place]) 04/12/2014  . Suicidal ideation 04/12/2014  . Injury of right shoulder and upper arm 02/17/2014  . Left leg pain 06/03/2013  . Right hip pain 06/03/2013  . Migraine with status migrainosus 01/08/2013  . Personality disorder ([place])   . Chronic migraine 05/08/2012  . Contact dermatitis 11/27/2011  . Major depressive disorder, recurrent episode ([place])  10/27/2011  . Generalized anxiety disorder 10/27/2011  . 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 Borderline personality disorder ([place]) 10/27/2011  . Right foot pain 09/28/2011  . 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 Ganglion cyst 09/29/2009  . PES PLANUS 07/01/2008  . BIPOLAR DISORDER UNSPECIFIED 06/09/2008    [facility] ,MS, CCC-SLP  01/22/2018, 11:51 AM  [facility] [address] [place], [place], [address] Phone: [phone]   Fax:  [phone]  Name: TIFFENY MINCHEW MRN: [id number] Date of Birth: [birth year]

## 2018-01-28 ENCOUNTER — Ambulatory Visit (HOSPITAL_BASED_OUTPATIENT_CLINIC_OR_DEPARTMENT_OTHER): Payer: PPO | Admitting: Genetics

## 2018-01-28 DIAGNOSIS — Z1379 Encounter for other screening for genetic and chromosomal anomalies: Secondary | ICD-10-CM

## 2018-01-28 DIAGNOSIS — Z8481 Family history of carrier of genetic disease: Secondary | ICD-10-CM

## 2018-01-28 DIAGNOSIS — Z800 Family history of malignant neoplasm of digestive organs: Secondary | ICD-10-CM | POA: Diagnosis not present

## 2018-01-28 DIAGNOSIS — Z803 Family history of malignant neoplasm of breast: Secondary | ICD-10-CM

## 2018-01-29 ENCOUNTER — Encounter: Payer: Self-pay | Admitting: Genetics

## 2018-01-29 ENCOUNTER — Ambulatory Visit: Payer: PPO

## 2018-01-29 ENCOUNTER — Inpatient Hospital Stay: Payer: PPO | Attending: Genetic Counselor

## 2018-01-29 DIAGNOSIS — Z800 Family history of malignant neoplasm of digestive organs: Secondary | ICD-10-CM | POA: Diagnosis not present

## 2018-01-29 DIAGNOSIS — Z8481 Family history of carrier of genetic disease: Secondary | ICD-10-CM | POA: Diagnosis not present

## 2018-01-29 DIAGNOSIS — Z803 Family history of malignant neoplasm of breast: Secondary | ICD-10-CM | POA: Insufficient documentation

## 2018-01-29 DIAGNOSIS — Z808 Family history of malignant neoplasm of other organs or systems: Secondary | ICD-10-CM | POA: Diagnosis not present

## 2018-01-29 NOTE — Progress Notes (Addendum)
REFERRING PROVIDER: Self, mother  PRIMARY PROVIDER:  Rankins, Bill Salinas, MD  PRIMARY REASON FOR VISIT:  1. Family history of genetic disease carrier   2. Family history of breast cancer   3. Family history of colon cancer     HISTORY OF PRESENT ILLNESS:   [name], a 47 y.o. female, was seen for a [facility] cancer genetics consultation due to her mother having a CHEK2 likely pathogenic variant.[name] presents to clinic today to discuss the possibility of a hereditary predisposition to cancer, genetic testing, and to further clarify her future cancer risks, as well as potential cancer risks for family members.   [name] is a 47 y.o. 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 This was followed for 5 years and she had no further issues after this period of time.   CANCER HISTORY:   No history exists.   HORMONAL RISK FACTORS:  Menarche was at age 66.  First live birth at age N/A.  OCP use for approximately 1-2 years years.  Ovaries intact: yes.  Hysterectomy: no.  Menopausal status: premenopausal.  HRT use: 0 years. Colonoscopy: no; not examined. Mammogram within the last year: yes. Number of breast biopsies: 1.  Past Medical History:  Diagnosis Date  . Achilles tendinitis   . Achilles tendinitis   . ADHD (attention deficit hyperactivity disorder)   . Allergy   . Arthritis   . Bipolar affective ([place])   . Bipolar disorder ([place])   . Cataracts, bilateral   . Eczema   . Family history of breast cancer   . Family history of colon cancer   . Family history of genetic disease carrier   . Ganglion cyst 09/29/2009   left wrist (2 cyst)  . Hyperprolactinemia ([place])   . Hypertension   . Lipoma   . Migraine   . Personality disorder ([place])   . Pes planus     Past Surgical History:  Procedure Laterality Date  . ANKLE SURGERY  12/88   left   . chest nodule  1990?   rt chest wall nodule removal  . GANGLION CYST EXCISION  2011  .  lipoma removal    . right bunioectomy    . SHOULDER SURGERY  01/13/2011   right, partial tear  . tumor resection left thigh      Social History   Socioeconomic History  . Marital status: Single    Spouse name: Not on file  . Number of children: 0  . Years of education: Not on file  . Highest education level: Not on file  Occupational History    Employer: [employer]  . Financial resource strain: Not on file  . Food insecurity:    Worry: Not on file    Inability: Not on file  . Transportation needs:    Medical: Not on file    Non-medical: Not on file  Tobacco Use  . Smoking status: Never Smoker  . Smokeless tobacco: Never Used  Substance and Sexual Activity  . Alcohol use: No    Alcohol/week: 0.0 standard drinks  . Drug use: No  . Sexual activity: Never    Birth control/protection: Pill  Lifestyle  . Physical activity:    Days per week: Not on file    Minutes per session: Not on file  . Stress: Not on file  Relationships  . Social connections:    Talks on phone: Not on file    Gets together:  Not on file    Attends religious service: Not on file    Active member of club or organization: Not on file    Attends meetings of clubs or organizations: Not on file    Relationship status: Not on file  Other Topics Concern  . Not on file  Social History Narrative   Caffeine  2 sodas daily, 1 cup coffee daily.     FAMILY HISTORY:  We obtained a detailed, 4-generation family history.  Significant diagnoses are listed below: Family History  Problem Relation Age of Onset  . Hypertension Mother   . Hyperlipidemia Mother   . Breast cancer Mother 55       genetic testing- CHEK2 likely pathogenic variant  . Heart attack Father   . Heart disease Father   . Hypertension Father   . Bipolar disorder Father   . Diabetes Paternal Grandfather   . Heart disease Maternal Aunt   . Breast cancer Maternal Aunt 65  . Heart disease Maternal Grandmother   . Colon cancer  Maternal Grandmother 66  . Colon cancer Maternal Uncle 75  . Lymphoma Maternal Grandfather 73  . Breast cancer Maternal Aunt 65  . Breast cancer Maternal Aunt 35  . Breast cancer Maternal Aunt   . Bone cancer Cousin 15  . Cervical cancer Cousin        Genetic testing- 'was positive for a gene' had a mastectomy    [name] has no children.  She has a brother who is 72 with no hx of cancer.   [name]'s father: recently died, no other info is known Paternal Aunts/Uncles: 3-4 paternal aunts, no known cancer, limited info Paternal cousins: no known hx of cancer, limited info Paternal grandfather: had diabetes Paternal grandmother:no cancer, but a lot of other health issues  [name]'s mother: dx with breast cancer in April 2019 at the age of 60, and now is being treated for endometrial cancer. She had genetic testing that originally revealed a variant of uncertain significance in the gene CHEK2 [id number] ([employer]).   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   Maternal Aunts/Uncles: 8 maternal aunts, 2 maternal uncles: -1 maternal aunt dx with breast cancer at 18, died at 4 -1 maternal aunt dx with breast cancer at 70 -1 maternal aunt dx with breast cancer at 23 -1 maternal aunt died in her 33's, had hx of breast cancer -1 maternal uncle had colon cancer in his 48's/80's -1 maternal uncle died with Alzheimer's disease. Maternal cousins: 1 maternal cousin had cervical/female cancer and tested positive for a 'gene' she had a mastectomy. Another cousin died of bone cancer in her 85's.   Maternal grandfather: died at 23 due to lymphoma dx at 47 Maternal grandmother: died at 62, colon cancer dx at 4.    Patient's maternal ancestors are of German/Irish/[place] descent, and paternal ancestors are of N. European descent. There is no reported Ashkenazi Jewish ancestry. There is no known consanguinity.  GENETIC  COUNSELING ASSESSMENT: [name] is a 47 y.o. female with a family history of a CHEK2 likely pathogenic variant. We, therefore, discussed and recommended the following at today's visit.   CHEK2 CHEK2 mutationsareassociated with an increased risk of breast, colon,and other cancers. The estimated cancer risks vary widely and may be influenced by family history.  Breast Cancer: Women with a CHEK2 deleterious mutation have approximately a 24% (no family history of breast cancer) to 48% (strong  family history of breast cancer) lifetime risk of breast cancer,and up to a 25% risk fora second breast cancer. Men may have asomewhatincreased risk for female breast cancer.This risk is not nearly as high for men as it is for women, but is not well defined.  According to the NCCN guidelines,womenwith CHEK2 mutations should consider breast MRI's as a part of regular breast cancer screening, and depending on family history could consider a risk-reducing mastectomy.For women, breast cancer screening should begin at age 27 or 31 years younger than the earliest ageat breast cancer diagnosis in the family. Additional publications underscore the appropriateness of breast MRIs and consideration of chemoprevention (tamoxifen) due to the greater than 20% lifetime risk of breast cancer associated with the 1100delC variant (PMID: 16010932, [id number]).  Colon Cancer: Men and womenwith CHEK2 mutationshave an increased risk of colon cancer (~10% lifetime risk).  Colonoscopy screening every 5 years beginning at age 33(ormore oftenbased on polyp findings/ GI physician recommendations).  If an individual has a first-degree relative with colorectal cancer, screening should begin 10 years prior to the relative's age at diagnosis if before 6.   If an individual has a personal history of colorectal cancer, screening recommendations should be based on recommendations for post-colorectal cancer  resection.   Prostate Cancer: The risk of prostate cancer in men with a CHEK2 mutation is estimated to be increased up to 30%. However, the benefits of screening for prostate cancer among men with a pathogenic variant in CHEK2 are uncertain (PMID: 25427062).They may discuss the option of incraesed prostate cancer screening with their providers.  Other Cancer Risks: It has been suggested CHEK2 mutationsmay beassociated with other malignancies such as ovarian, uterine,thyroid, melanoma, bladder, or kidney.  However, more data is still needed to confirm these associations.   DISCUSSION: We reviewed the characteristics, features and inheritance patterns of hereditary cancer syndromes. We also discussed genetic testing, including the appropriate family members to test, the process of testing, insurance coverage and turn-around-time for results. We discussed the implications of a negative, positive and/or variant of uncertain significant result.  We discussed the options of single site testing for the familial CHEK2 mutation as well as panel testing.  Given the limited information on her father's side of the family, Ms. Tavis elected to have a large pan-cancer panel tested.   The Multi-Cancer Panel offered by [facility] includes sequencing and/or deletion duplication testing of the following 91 genes: AIP, ALK, APC, ATM, AXIN2, BAP1, BARD1, BLM, BMPR1A, BRCA1, BRCA2, BRIP1, BUB1B, CASR, CDC73, CDH1, CDK4, CDKN1B, CDKN1C, CDKN2A, CEBPA, CEP57, CHEK2, CTNNA1, DICER1, DIS3L2, EGFR, ENG, EPCAM, FH, FLCN, GALNT12, GATA2, GPC3, GREM1, HOXB13, HRAS, KIT, MAX, MEN1, MET, MITF, MLH1, MLH3, MSH2, MSH3, MSH6, MUTYH, NBN, NF1, NF2, NTHL1, PALB2, PDGFRA, PHOX2B, PMS2, POLD1, POLE, POT1, PRKAR1A, PTCH1, PTEN, RAD50, RAD51C, RAD51D, RB1, RECQL4, RET, RNF43, RPS20, RUNX1, SDHA, SDHAF2, SDHB, SDHC, SDHD, SMAD4, SMARCA4, SMARCB1, SMARCE1, STK11, SUFU, TERC, TERT, TMEM127, TP53, TSC1, TSC2, VHL, WRN, WT1  We discussed  that if she is found to have a mutation in one of these genes, it may impact future medical management recommendations such as increased cancer screenings and consideration of risk reducing surgeries.  A positive result could also have implications for the patient's family members.  A Negative result would mean she did not inherit her mother's CHEK2 mutation and that no other hereditary caner predisposition mutations were identified. This does not rule out the possibility of a hereditary risk for cancer.  There could be mutations that are undetectable by  current technology, or in genes not yet tested or identified to increase cancer risk.    We discussed the potential to find a Variant of Uncertain Significance or VUS.  These are variants that have not yet been identified as pathogenic or benign, and it is unknown if this variant is associated with increased cancer risk or if this is a normal finding.  Most VUS's are reclassified to benign or likely benign.   It should not be used to make medical management decisions. With time, we suspect the lab will determine the significance of any VUS's identified if any.   Based on Ms. Manthei's family history of cancer and a CHEK2 mutation, she meets medical criteria for genetic testing. Despite that she meets criteria, she may still have an out of pocket cost. The laboratory can provide her with an estimate of her OOP cost.  she was given the contact information for the laboratory if she has further questions.   PLAN: After considering the risks, benefits, and limitations, Ms. Thorns  provided informed consent to pursue genetic testing and the blood sample will be taken on 01/29/18 and sent to [facility] for analysis of the Multi-Cancer Panel. Results should be available within approximately 2-3 weeks' time, at which point they will be disclosed by telephone to Ms. Monteverde, as will any additional recommendations warranted by these results. Ms. Paulette will  receive a summary of her genetic counseling visit and a copy of her results once available. This information will also be available in Epic. We encouraged Ms. Boardley to remain in contact with cancer genetics annually so that we can continuously update the family history and inform her of any changes in cancer genetics and testing that may be of benefit for her family. Ms. Minahan questions were answered to her satisfaction today. Our contact information was provided should additional questions or concerns arise.  Based on Ms. Corp's family history, we recommended her brother and maternal relatives also, also have genetic counseling and testing. Ms. Bunner will let us know if we can be of any assistance in coordinating genetic counseling and/or testing for this family member.   Lastly, we encouraged Ms. Malak to remain in contact with cancer genetics annually so that we can continuously update the family history and inform her of any changes in cancer genetics and testing that may be of benefit for this family.   Ms.  Belleau questions were answered to her satisfaction today. Our contact information was provided should additional questions or concerns arise. Thank you for the referral and allowing [place] to share in the care of your patient.   Tana Felts, MS, [facility] Certified Genetic Counselor [email or web address] phone: [phone]  The patient was seen for a total of 20 minutes in face-to-face genetic counseling.  The patient was accompanied today by her mother Iridiana Fonner This patient was discussed with Drs. Magrinat, Lindi Adie and/or Burr Medico who agrees with the above.

## 2018-02-01 ENCOUNTER — Ambulatory Visit: Payer: PPO

## 2018-02-01 ENCOUNTER — Ambulatory Visit: Payer: PPO | Attending: Family Medicine

## 2018-02-01 DIAGNOSIS — R296 Repeated falls: Secondary | ICD-10-CM | POA: Diagnosis not present

## 2018-02-01 DIAGNOSIS — R2681 Unsteadiness on feet: Secondary | ICD-10-CM

## 2018-02-01 DIAGNOSIS — M6281 Muscle weakness (generalized): Secondary | ICD-10-CM | POA: Diagnosis not present

## 2018-02-01 DIAGNOSIS — R41841 Cognitive communication deficit: Secondary | ICD-10-CM | POA: Diagnosis not present

## 2018-02-01 DIAGNOSIS — R4701 Aphasia: Secondary | ICD-10-CM | POA: Diagnosis not present

## 2018-02-01 DIAGNOSIS — R42 Dizziness and giddiness: Secondary | ICD-10-CM

## 2018-02-01 DIAGNOSIS — M542 Cervicalgia: Secondary | ICD-10-CM

## 2018-02-01 NOTE — Therapy (Signed)
[facility] [address] [place], [place], [address] Phone: [phone]   Fax:  [phone]  Physical Therapy Treatment  Patient Details  Name: [name] MRN: [id number] Date of Birth: 08-01-1970 Referring Provider (PT): Larey Seat, MD   Encounter Date: 02/01/2018  PT End of Session - 02/01/18 1146    Visit Number  5    Number of Visits  17    Date for PT Re-Evaluation  03/03/18    Authorization Type  $15 copay; [employer].  VL: follow Medicare guidelines.  10th visit PN    PT Start Time  [phone]   pt with speech   PT Stop Time  0935    PT Time Calculation (min)  44 min    Equipment Utilized During Treatment  --   min guard to S prn   Activity Tolerance  Patient tolerated treatment well    Behavior During Therapy  [facility] for tasks assessed/performed       Past Medical History:  Diagnosis Date  . Achilles tendinitis   . Achilles tendinitis   . ADHD (attention deficit hyperactivity disorder)   . Allergy   . Arthritis   . Bipolar affective ([place])   . Bipolar disorder ([place])   . Cataracts, bilateral   . Eczema   . Family history of breast cancer   . Family history of colon cancer   . Family history of genetic disease carrier   . Ganglion cyst 09/29/2009   left wrist (2 cyst)  . Hyperprolactinemia ([place])   . Hypertension   . Lipoma   . Migraine   . Personality disorder ([place])   . Pes planus     Past Surgical History:  Procedure Laterality Date  . ANKLE SURGERY  12/88   left   . chest nodule  1990?   rt chest wall nodule removal  . GANGLION CYST EXCISION  2011  . lipoma removal    . right bunioectomy    . SHOULDER SURGERY  01/13/2011   right, partial tear  . tumor resection left thigh      There were no vitals filed for this visit.  Subjective Assessment - 02/01/18 0852    Subjective  Pt sad today, as she found out her mother does have cancer again.     Pertinent History  OT for shoulder  pain, PT at [facility] for hip; multiple falls, achilles tendonitis, ADHD, OA, bipolar disorder, cataracts bilaterally, eczema, HTN, lipoma with removal, migraine, personality disorder, suicide ideation, L ankle surgery, R bunionectomy, R shoulder surgery and L thigh tumor resection    Diagnostic tests  CT was negative for acute changes, neurology has ordered and MRI - not performed yet    Patient Stated Goals  To be more steady and to improve neck ROM    Currently in Pain?  Yes    Pain Score  5     Pain Location  Ankle    Pain Orientation  Right    Pain Descriptors / Indicators  Sore    Pain Type  Acute pain    Pain Onset  1 to 4 weeks ago    Aggravating Factors   Weight bearing/standing    Pain Relieving Factors  Rest, ice/heat, wears boot for comfort at work          [facility] PT Assessment - 02/01/18 0912      AROM   AROM Assessment Site  Cervical   1/10 at rest  Cervical Flexion  44   no incr. in pain   Cervical Extension  30   no incr. in pain just tingling in Cx spine   Cervical - Right Side Bend  31   no incr. in pain   Cervical - Left Side Bend  30   no incr. in pain   Cervical - Right Rotation  65   no incr. in pain   Cervical - Left Rotation  46   pain incr. to 2/10 and reports "it feels tighter"     Functional Gait  Assessment   Gait assessed   Yes    Gait Level Surface  Walks 20 ft in less than 7 sec but greater than 5.5 sec, uses assistive device, slower speed, mild gait deviations, or deviates 6-10 in outside of the 12 in walkway width.   6.41 sec.    Change in Gait Speed  Able to change speed, demonstrates mild gait deviations, deviates 6-10 in outside of the 12 in walkway width, or no gait deviations, unable to achieve a major change in velocity, or uses a change in velocity, or uses an assistive device.    Gait with Horizontal Head Turns  Performs head turns smoothly with slight change in gait velocity (eg, minor disruption to smooth gait path), deviates 6-10 in  outside 12 in walkway width, or uses an assistive device.    Gait with Vertical Head Turns  Performs task with slight change in gait velocity (eg, minor disruption to smooth gait path), deviates 6 - 10 in outside 12 in walkway width or uses assistive device    Gait and Pivot Turn  Pivot turns safely in greater than 3 sec and stops with no loss of balance, or pivot turns safely within 3 sec and stops with mild imbalance, requires small steps to catch balance.    Step Over Obstacle  Is able to step over one shoe box (4.5 in total height) without changing gait speed. No evidence of imbalance.    Gait with Narrow Base of Support  Ambulates 4-7 steps.   4 steps   Gait with Eyes Closed  Walks 20 ft, slow speed, abnormal gait pattern, evidence for imbalance, deviates 10-15 in outside 12 in walkway width. Requires more than 9 sec to ambulate 20 ft.   11.75 sec. with hardly any deviation   Ambulating Backwards  Walks 20 ft, slow speed, abnormal gait pattern, evidence for imbalance, deviates 10-15 in outside 12 in walkway width.    Steps  Alternating feet, must use rail.    Total Score  17    FGA comment:  17/30: indicates pt is at a high risk for falls. Pt reported some lightheadedness and unsteadiness with head turns/nods.        Therex: Access Code: [email or web address]  URL: [email or web address]  Date: 02/01/2018  Prepared by: Geoffry Paradise   Exercises  Seated Scapular Retraction - 10 reps - 3 sets - 1x daily - 3x weekly  Correct Seated Posture - 1x daily - 7x weekly  Supine Chin Tuck - 5 reps - 2 sets - 5 hold - 1x daily - 7x weekly  Pt required cues and demo for technique, as pt reported she forgot how to perform correctly.                    Self Care: PT Education - 02/01/18 1144    Education provided  Yes    Education Details  PT spent first 10 minutes  of session discussing the importance of pt f/u with therapist re: pt's mom recent CA diagnosis. Pt reported her  mom doesn't understand why pt would need to see her therapist twice in one week, however, pt is feelingn sad and stressed by her mom's dx. PT reiterated the importance of taking care of herself mentally and physically. PT discussed goal progress and outcome measure results. PT discussed switching three appt's back to primary PT Letta Moynahan) to determine if dry needling would be appropriate for pt.     Person(s) Educated  Patient    Methods  Explanation    Comprehension  Verbalized understanding       PT Short Term Goals - 02/01/18 1151      PT SHORT TERM GOAL #1   Title  Pt will participate in full vestibular assessment and will initiate vestibular HEP    Time  4    Period  Weeks    Status  Achieved      PT SHORT TERM GOAL #2   Title  Pt will participate in further balance assessment (gait velocity, FGA) and will initiate balance and strengthening HEP    Time  4    Period  Weeks    Status  Achieved      PT SHORT TERM GOAL #3   Title  Pt will demonstrate independence with initial cervical ROM and postural exercises for HEP    Time  4    Period  Weeks    Status  Partially Met      PT SHORT TERM GOAL #4   Title  Pt will improve cervical ROM by 5-8 degrees and report </= 4/10 pain with movement    Time  4    Period  Weeks    Status  Partially Met      PT SHORT TERM GOAL #5   Title  Pt will improve FGA score to >/=15/30 to decr. falls risk.     Status  Achieved        PT Long Term Goals - 01/18/18 0950      PT LONG TERM GOAL #1   Title  Pt will be independent with vestibular, balance, neck/posture HEP    Time  8    Period  Weeks    Status  New      PT LONG TERM GOAL #2   Title  Pt will demonstrate 10-12 deg increase in cervical spine ROM and </= 2/10 pain with movement    Time  8    Period  Weeks    Status  New      PT LONG TERM GOAL #3   Title  Pt will improve gait velocity to >/= 3.0 ft/sec with no evidence of imbalance or needing to touch walls/furniture    Baseline   TBD    Time  8    Period  Weeks    Status  New      PT LONG TERM GOAL #4   Title  Pt will improve FGA by 8 points to decrease falls risk during gait    Baseline  11/30 on 01/18/18    Time  8    Period  Weeks    Status  Revised      PT LONG TERM GOAL #5   Title  Vestibular goal if indicated            Plan - 02/01/18 0924    Clinical Impression Statement  Pt demonstrated progress as she met STGs  1, 2, and 5. Pt partially met STGs 3 and 4. Pt 's FGA score continues to indicate pt is at a high risk for falls. Pt continues to require cues during cervical/postural HEP for technique. Pt would continue to benefit from skilled PT to improve safety during functional mobility.     Rehab Potential  Good    PT Frequency  2x / week    PT Duration  8 weeks    PT Treatment/Interventions  ADLs/Self Care Home Management;Cryotherapy;Lobbyist;Therapeutic exercise;Therapeutic activities;Functional mobility training;Patient/family education;Manual techniques;Canalith Repostioning;Moist Heat;Gait training;Neuromuscular re-education;Passive range of motion;Dry needling;Vestibular    PT Next Visit Plan  Switch to [place] for 3 appt's to assess for dry needling of shoulder/neck mm?    PT Home Exercise Plan  [email or web address]    Consulted and Agree with Plan of Care  Patient       Patient will benefit from skilled therapeutic intervention in order to improve the following deficits and impairments:  Decreased range of motion, Difficulty walking, Increased muscle spasms, Pain, Decreased balance, Impaired flexibility, Postural dysfunction, Decreased strength, Decreased mobility, Decreased cognition, Dizziness  Visit Diagnosis: Dizziness and giddiness  Unsteadiness on feet  Muscle weakness (generalized)  Cervicalgia     Problem List Patient Active Problem List   Diagnosis Date Noted  . Family history of genetic disease carrier   . Family history of breast cancer   . Family  history of colon cancer   . Head injury with loss of consciousness ([place]) 11/20/2017  . Nausea 11/20/2017  . 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 Tremor observed on examination 11/20/2017  . Left shoulder pain 02/18/2017  . MDD (major depressive disorder), recurrent severe, without psychosis ([place]) 12/28/2016  . Muscle strain 09/19/2016  . Right foot injury, subsequent encounter 08/25/2016  . Low back pain 06/27/2016  . Left wrist injury, subsequent encounter 06/27/2016  . Pain of left thumb 10/07/2015  . 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 Strain of left thumb 02/11/2015  . Strain of right forearm 02/11/2015  . Right shoulder pain 12/31/2014  . Injury of right little finger 12/31/2014  . Episodic cluster headache, 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  . 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 Hypersomnia, recurrent 12/02/2014  . Migraine aura, persistent, intractable, with status migrainosus 12/02/2014  . Lower back injury 09/30/2014  . Bipolar I disorder, most recent episode depressed ([place])   . MDD (major depressive disorder), recurrent, severe, with psychosis ([place]) 09/23/2014  . Injury of left index finger 08/20/2014  . Right ankle sprain 06/01/2014  . Contusion, multiple sites 06/01/2014  . Strain of right gastrocnemius muscle 06/01/2014  . Phonophobia 05/04/2014  . Photophobia of both eyes 05/04/2014  . Emotionally unstable borderline personality disorder ([place]) 05/04/2014  . Nausea with vomiting 05/04/2014  . Mixed bipolar I disorder ([place])   . Bipolar I disorder, most recent episode mixed ([place]) 04/18/2014  . Bipolar affective disorder, depressed, mild ([place]) 04/12/2014  . Suicidal ideation 04/12/2014  . Injury of right shoulder and upper arm 02/17/2014  . Left leg pain 06/03/2013  . Right hip pain 06/03/2013  . Migraine with  status migrainosus 01/08/2013  . Personality disorder ([place])   . Chronic migraine 05/08/2012  . Contact dermatitis 11/27/2011  . Major depressive disorder, recurrent episode ([place]) 10/27/2011  . Generalized anxiety disorder 10/27/2011  . 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  Borderline personality disorder ([place]) 10/27/2011  . Right foot pain 09/28/2011  . 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 Ganglion cyst 09/29/2009  . PES PLANUS 07/01/2008  . BIPOLAR DISORDER UNSPECIFIED 06/09/2008    Harriett Azar L 02/01/2018, 11:54 AM  [facility] [address] [place], [place], [address] Phone: [phone]   Fax:  [phone]  Name: NORELL BRISBIN MRN: [id number] Date of Birth: 1970/10/10   Geoffry Paradise, PT,DPT 02/01/18 11:55 AM Phone: [phone] Fax: [phone]

## 2018-02-01 NOTE — Patient Instructions (Signed)
  Please complete the assigned speech therapy homework prior to your next session and return it to the speech therapist at your next visit.  

## 2018-02-01 NOTE — Therapy (Signed)
[facility] [address] [place], [place], [address] Phone: [phone]   Fax:  [phone]  Speech Language Pathology Treatment  Patient Details  Name: [name] MRN: [id number] Date of Birth: 10-Oct-1970 Referring Provider (SLP): Seymour Bars, NP   Encounter Date: 02/01/2018  End of Session - 02/01/18 0854    Visit Number  2    Number of Visits  17    Date for SLP Re-Evaluation  04/19/18    SLP Start Time  0805    SLP Stop Time   0846    SLP Time Calculation (min)  41 min    Activity Tolerance  Patient tolerated treatment well       Past Medical History:  Diagnosis Date  . Achilles tendinitis   . Achilles tendinitis   . ADHD (attention deficit hyperactivity disorder)   . Allergy   . Arthritis   . Bipolar affective ([place])   . Bipolar disorder ([place])   . Cataracts, bilateral   . Eczema   . Family history of breast cancer   . Family history of colon cancer   . Family history of genetic disease carrier   . Ganglion cyst 09/29/2009   left wrist (2 cyst)  . Hyperprolactinemia ([place])   . Hypertension   . Lipoma   . Migraine   . Personality disorder ([place])   . Pes planus     Past Surgical History:  Procedure Laterality Date  . ANKLE SURGERY  12/88   left   . chest nodule  1990?   rt chest wall nodule removal  . GANGLION CYST EXCISION  2011  . lipoma removal    . right bunioectomy    . SHOULDER SURGERY  01/13/2011   right, partial tear  . tumor resection left thigh      There were no vitals filed for this visit.  Subjective Assessment - 02/01/18 0847    Subjective  "I can't remember the word for the concept."            ADULT SLP TREATMENT - 02/01/18 0810      General Information   Behavior/Cognition  Alert;Cooperative;Other (comment)   flat affect     Treatment Provided   Treatment provided  Cognitive-Linquistic      Cognitive-Linquistic Treatment   Treatment focused on   Aphasia    Skilled Treatment  SLP reviewed pt's semantic web homework and assisted pt in adding and being more descriptive with her responses. In covnersation, anomia noted x3 in 40 minutes. Pt stated she had more anomic moments but compensated with synonym or circumlocution.       Assessment / Recommendations / Plan   Plan  Continue with current plan of care      Progression Toward Goals   Progression toward goals  Progressing toward goals         SLP Short Term Goals - 02/01/18 0856      SLP SHORT TERM GOAL #1   Title  pt will complete cognitive linguistic testing in the first 2 therapy sessions    Time  1    Period  --   visits   Status  On-going      SLP SHORT TERM GOAL #2   Title  pt will name average 14 items in common categories in 60 seconds    Time  4    Period  Weeks    Status  On-going      SLP SHORT TERM  GOAL #3   Title  pt will successfully use at least 2 compensatory strategies for anomia multiple times in 10 mintues of conversation    Time  4    Period  Weeks    Status  On-going       SLP Long Term Goals - 02/01/18 0856      SLP LONG TERM GOAL #1   Title  pt will improve her [place] Naming Test score to at least 51/60 on her last day of therapy    Time  8    Period  Weeks   17 sessions, for all LTGs   Status  On-going      SLP LONG TERM GOAL #2   Title  pt will use anomia compensations successfully in 15 minutes simple to mod complex conversation over 3 therapy sessions    Time  8    Period  Weeks    Status  On-going       Plan - 02/01/18 0854    Clinical Impression Statement  Pt cont to present today with overt s/s anomia x3 dueing conversation with more examples that pt used the synonym or circumlocution strategies. Pt again mentioned frustration because of her word finding deficit, and it affects her work and personal life daily. She would cont to benefit from skillled ST targeting improved verbal fluency as well as any cognitive communication  deficits ID'd in formal cognitive linguistic testing.     Speech Therapy Frequency  2x / week    Duration  --   8 weeks/17 visits   Treatment/Interventions  Language facilitation;Environmental controls;Cueing hierarchy;SLP instruction and feedback;Compensatory strategies;Cognitive reorganization;Functional tasks;Internal/external aids;Multimodal communcation approach;Patient/family education    Potential to Achieve Goals  Good       Patient will benefit from skilled therapeutic intervention in order to improve the following deficits and impairments:   Aphasia  Cognitive communication deficit    Problem List Patient Active Problem List   Diagnosis Date Noted  . Family history of genetic disease carrier   . Family history of breast cancer   . Family history of colon cancer   . Head injury with loss of consciousness ([place]) 11/20/2017  . Nausea 11/20/2017  . 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 Tremor observed on examination 11/20/2017  . Left shoulder pain 02/18/2017  . MDD (major depressive disorder), recurrent severe, without psychosis ([place]) 12/28/2016  . Muscle strain 09/19/2016  . Right foot injury, subsequent encounter 08/25/2016  . Low back pain 06/27/2016  . Left wrist injury, subsequent encounter 06/27/2016  . Pain of left thumb 10/07/2015  . 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 Strain of left thumb 02/11/2015  . Strain of right forearm 02/11/2015  . Right shoulder pain 12/31/2014  . Injury of right little finger 12/31/2014  . Episodic cluster headache, 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  . 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 Hypersomnia, recurrent 12/02/2014  . Migraine aura, persistent, intractable, with status migrainosus 12/02/2014  . Lower back injury 09/30/2014  . Bipolar I disorder, most recent episode  depressed ([place])   . MDD (major depressive disorder), recurrent, severe, with psychosis ([place]) 09/23/2014  . Injury of left index finger 08/20/2014  . Right ankle sprain 06/01/2014  . Contusion, multiple sites 06/01/2014  . Strain of right gastrocnemius muscle 06/01/2014  . Phonophobia 05/04/2014  . Photophobia of both eyes 05/04/2014  . Emotionally unstable  borderline personality disorder ([place]) 05/04/2014  . Nausea with vomiting 05/04/2014  . Mixed bipolar I disorder ([place])   . Bipolar I disorder, most recent episode mixed ([place]) 04/18/2014  . Bipolar affective disorder, depressed, mild ([place]) 04/12/2014  . Suicidal ideation 04/12/2014  . Injury of right shoulder and upper arm 02/17/2014  . Left leg pain 06/03/2013  . Right hip pain 06/03/2013  . Migraine with status migrainosus 01/08/2013  . Personality disorder ([place])   . Chronic migraine 05/08/2012  . Contact dermatitis 11/27/2011  . Major depressive disorder, recurrent episode ([place]) 10/27/2011  . Generalized anxiety disorder 10/27/2011  . 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 Borderline personality disorder ([place]) 10/27/2011  . Right foot pain 09/28/2011  . 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 Ganglion cyst 09/29/2009  . PES PLANUS 07/01/2008  . BIPOLAR DISORDER UNSPECIFIED 06/09/2008    [facility] ,MS, CCC-SLP  02/01/2018, 8:57 AM  [facility] [address] [place], [place], [address] Phone: [phone]   Fax:  [phone]   Name: [name] BLANKENBECKLER MRN: [id number] Date of Birth: 16-Sep-1970

## 2018-02-01 NOTE — Patient Instructions (Signed)
Access Code: [email or web address]  URL: [email or web address]  Date: 02/01/2018  Prepared by: Geoffry Paradise   Exercises  Seated Scapular Retraction - 10 reps - 3 sets - 1x daily - 3x weekly  Correct Seated Posture - 1x daily - 7x weekly  Supine Chin Tuck - 5 reps - 2 sets - 5 hold - 1x daily - 7x weekly

## 2018-02-05 ENCOUNTER — Ambulatory Visit: Payer: PPO

## 2018-02-05 DIAGNOSIS — R42 Dizziness and giddiness: Secondary | ICD-10-CM | POA: Diagnosis not present

## 2018-02-05 DIAGNOSIS — R2681 Unsteadiness on feet: Secondary | ICD-10-CM

## 2018-02-05 DIAGNOSIS — R41841 Cognitive communication deficit: Secondary | ICD-10-CM

## 2018-02-05 DIAGNOSIS — R4701 Aphasia: Secondary | ICD-10-CM

## 2018-02-05 DIAGNOSIS — M6281 Muscle weakness (generalized): Secondary | ICD-10-CM

## 2018-02-05 NOTE — Therapy (Signed)
[facility] [address] [place], [place], [address] Phone: [phone]   Fax:  [phone]  Physical Therapy Treatment  Patient Details  Name: [name] MRN: [id number] Date of Birth: 05/01/1970 Referring Provider (PT): Larey Seat, MD   Encounter Date: 02/05/2018  PT End of Session - 02/05/18 0942    Visit Number  6    Number of Visits  17    Date for PT Re-Evaluation  03/03/18    Authorization Type  $15 copay; [employer].  VL: follow Medicare guidelines.  10th visit PN    PT Start Time  [phone]   with speech   PT Stop Time  0930    PT Time Calculation (min)  38 min    Equipment Utilized During Treatment  --   S prn   Activity Tolerance  Patient tolerated treatment well    Behavior During Therapy  WFL for tasks assessed/performed       Past Medical History:  Diagnosis Date  . Achilles tendinitis   . Achilles tendinitis   . ADHD (attention deficit hyperactivity disorder)   . Allergy   . Arthritis   . Bipolar affective ([place])   . Bipolar disorder ([place])   . Cataracts, bilateral   . Eczema   . Family history of breast cancer   . Family history of colon cancer   . Family history of genetic disease carrier   . Ganglion cyst 09/29/2009   left wrist (2 cyst)  . Hyperprolactinemia ([place])   . Hypertension   . Lipoma   . Migraine   . Personality disorder ([place])   . Pes planus     Past Surgical History:  Procedure Laterality Date  . ANKLE SURGERY  12/88   left   . chest nodule  1990?   rt chest wall nodule removal  . GANGLION CYST EXCISION  2011  . lipoma removal    . right bunioectomy    . SHOULDER SURGERY  01/13/2011   right, partial tear  . tumor resection left thigh      There were no vitals filed for this visit.  Subjective Assessment - 02/05/18 0855    Subjective  Pt denied falls or changes since last visit. Pt feels like R ankle/foot is getting better.     Pertinent History  OT for  shoulder pain, PT at [facility] for hip; multiple falls, achilles tendonitis, ADHD, OA, bipolar disorder, cataracts bilaterally, eczema, HTN, lipoma with removal, migraine, personality disorder, suicide ideation, L ankle surgery, R bunionectomy, R shoulder surgery and L thigh tumor resection    Patient Stated Goals  To be more steady and to improve neck ROM    Currently in Pain?  Yes    Pain Score  --   3-4/10   Pain Location  Ankle    Pain Orientation  Right    Pain Descriptors / Indicators  Aching    Pain Type  Chronic pain    Pain Radiating Towards  foot    Pain Onset  1 to 4 weeks ago    Pain Frequency  Constant    Aggravating Factors   standing/walking    Pain Relieving Factors  ice/heat, resting it                Neuro re-ed: Access Code: [email or web address]  URL: [email or web address]  Date: 02/05/2018  Prepared by: Geoffry Paradise   Exercises  Romberg Stance with Head Nods - 2-3 reps - 1 sets -  1x daily - 5x weekly (ceased on HEP due to progress) Romberg Stance Eyes Closed on Foam Pad - 2-3 reps - 1 sets - 10-30 SECONDS hold - 1x daily - 5x weekly  Backward Walking with Counter Support - 4 reps - 1 sets - 1x daily - 5x weekly  Walking with Head Rotation - 4 reps - 1 sets - 1x daily - 5x weekly  Seated Scapular Retraction - 10 reps - 3 sets - 1x daily - 3x weekly (REVIEW ONLY)  Correct Seated Posture - 1x daily - 7x weekly (REVIEW ONLY) Supine Chin Tuck - 5 reps - 2 sets - 5 hold - 1x daily - 7x weekly  (REVIEW ONLY) Standing feet apart on Foam Pad with head nods/rotation- 10 reps - 1 sets - 1x daily - 5x weekly  Pt performed all balance activities either at counter or in corner with chair in front of pt and S for safety. Cues and demo for technique (improved heel strike, narrow BOS during dynamic gait/balance activities). Pt utilized UE support as indicated. Incr. Postural sway noted but pt reported it was more unsteadiness vs. Dizziness.                Self Care: PT Education - 02/05/18 0940    Education provided  Yes    Education Details  Pt reported difficulty remembering to ice/heat ankle and to perform HEP 2/2 post-concussion cognitive impairments. PT instructed pt (and provided handout) on how to set a reminder on her phone for her "to-do list". PT reviewed and progressed HEP as tolerated.     Person(s) Educated  Patient    Methods  Explanation;Demonstration;Verbal cues;Handout    Comprehension  Returned demonstration;Verbalized understanding       PT Short Term Goals - 02/01/18 1151      PT SHORT TERM GOAL #1   Title  Pt will participate in full vestibular assessment and will initiate vestibular HEP    Time  4    Period  Weeks    Status  Achieved      PT SHORT TERM GOAL #2   Title  Pt will participate in further balance assessment (gait velocity, FGA) and will initiate balance and strengthening HEP    Time  4    Period  Weeks    Status  Achieved      PT SHORT TERM GOAL #3   Title  Pt will demonstrate independence with initial cervical ROM and postural exercises for HEP    Time  4    Period  Weeks    Status  Partially Met      PT SHORT TERM GOAL #4   Title  Pt will improve cervical ROM by 5-8 degrees and report </= 4/10 pain with movement    Time  4    Period  Weeks    Status  Partially Met      PT SHORT TERM GOAL #5   Title  Pt will improve FGA score to >/=15/30 to decr. falls risk.     Status  Achieved        PT Long Term Goals - 01/18/18 0950      PT LONG TERM GOAL #1   Title  Pt will be independent with vestibular, balance, neck/posture HEP    Time  8    Period  Weeks    Status  New      PT LONG TERM GOAL #2   Title  Pt will demonstrate 10-12 deg  increase in cervical spine ROM and </= 2/10 pain with movement    Time  8    Period  Weeks    Status  New      PT LONG TERM GOAL #3   Title  Pt will improve gait velocity to >/= 3.0 ft/sec with no evidence of imbalance or  needing to touch walls/furniture    Baseline  TBD    Time  8    Period  Weeks    Status  New      PT LONG TERM GOAL #4   Title  Pt will improve FGA by 8 points to decrease falls risk during gait    Baseline  11/30 on 01/18/18    Time  8    Period  Weeks    Status  Revised      PT LONG TERM GOAL #5   Title  Vestibular goal if indicated            Plan - 02/05/18 0911    Clinical Impression Statement  Pt demonstrated progress, as she was able to tolerate progression of balance HEP to compliant surfaces in order to incr. vestibular input. Pt continues to experience incr. postural sway during balance activities but reports she now feels more unsteady vs. dizziness. PT again discussed assessment to determine if pt is a candidate for dry needling and that PT will discuss with dry needling PT, pt agreeable. Pt would continue to benefit from skilled PT to improve safety during functional mobility.     Rehab Potential  Good    PT Frequency  2x / week    PT Duration  8 weeks    PT Treatment/Interventions  ADLs/Self Care Home Management;Cryotherapy;Lobbyist;Therapeutic exercise;Therapeutic activities;Functional mobility training;Patient/family education;Manual techniques;Canalith Repostioning;Moist Heat;Gait training;Neuromuscular re-education;Passive range of motion;Dry needling;Vestibular    PT Next Visit Plan  Switch to [facility] for 3 appt's to assess for dry needling of shoulder/neck mm? High level balance to encourage incr. vestibular input.     PT Home Exercise Plan  [email or web address]    Consulted and Agree with Plan of Care  Patient       Patient will benefit from skilled therapeutic intervention in order to improve the following deficits and impairments:  Decreased range of motion, Difficulty walking, Increased muscle spasms, Pain, Decreased balance, Impaired flexibility, Postural dysfunction, Decreased strength, Decreased mobility, Decreased cognition,  Dizziness  Visit Diagnosis: Dizziness and giddiness  Unsteadiness on feet  Muscle weakness (generalized)     Problem List Patient Active Problem List   Diagnosis Date Noted  . Family history of genetic disease carrier   . Family history of breast cancer   . Family history of colon cancer   . Head injury with loss of consciousness ([place]) 11/20/2017  . Nausea 11/20/2017  . 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 Tremor observed on examination 11/20/2017  . Left shoulder pain 02/18/2017  . MDD (major depressive disorder), recurrent severe, without psychosis ([place]) 12/28/2016  . Muscle strain 09/19/2016  . Right foot injury, subsequent encounter 08/25/2016  . Low back pain 06/27/2016  . Left wrist injury, subsequent encounter 06/27/2016  . Pain of left thumb 10/07/2015  . 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 Strain of left thumb 02/11/2015  . Strain of right forearm 02/11/2015  . Right shoulder pain 12/31/2014  . Injury of right little finger 12/31/2014  . Episodic cluster headache, 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  .  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 Hypersomnia, recurrent 12/02/2014  . Migraine aura, persistent, intractable, with status migrainosus 12/02/2014  . Lower back injury 09/30/2014  . Bipolar I disorder, most recent episode depressed ([place])   . MDD (major depressive disorder), recurrent, severe, with psychosis ([place]) 09/23/2014  . Injury of left index finger 08/20/2014  . Right ankle sprain 06/01/2014  . Contusion, multiple sites 06/01/2014  . Strain of right gastrocnemius muscle 06/01/2014  . Phonophobia 05/04/2014  . Photophobia of both eyes 05/04/2014  . Emotionally unstable borderline personality disorder ([place]) 05/04/2014  . Nausea with vomiting 05/04/2014  . Mixed bipolar I disorder ([place])   . Bipolar I  disorder, most recent episode mixed ([place]) 04/18/2014  . Bipolar affective disorder, depressed, mild ([place]) 04/12/2014  . Suicidal ideation 04/12/2014  . Injury of right shoulder and upper arm 02/17/2014  . Left leg pain 06/03/2013  . Right hip pain 06/03/2013  . Migraine with status migrainosus 01/08/2013  . Personality disorder ([place])   . Chronic migraine 05/08/2012  . Contact dermatitis 11/27/2011  . Major depressive disorder, recurrent episode ([place]) 10/27/2011  . Generalized anxiety disorder 10/27/2011  . 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 Borderline personality disorder ([place]) 10/27/2011  . Right foot pain 09/28/2011  . 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 Ganglion cyst 09/29/2009  . PES PLANUS 07/01/2008  . BIPOLAR DISORDER UNSPECIFIED 06/09/2008    Annell Canty L 02/05/2018, 9:48 AM  [facility] [address] [place], [place], [address] Phone: [phone]   Fax:  [phone]  Name: [name] MRN: [id number] Date of Birth: 02-08-1970  Geoffry Paradise, PT,DPT 02/05/18 9:50 AM Phone: [phone] Fax: [phone]

## 2018-02-05 NOTE — Patient Instructions (Addendum)
  How to set an alarm: 1) Go to calendar 2) Click the + button to add an event 3) Add the title of what you need a reminder for (example: ice) 4) Change the time/date as needed 5) Pick an alert (the time of, or before event) 6) Click save  Access Code: [email or web address]  URL: [email or web address]  Date: 02/05/2018  Prepared by: Geoffry Paradise   Exercises  Romberg Stance with Head Nods - 2-3 reps - 1 sets - 1x daily - 5x weekly  Romberg Stance Eyes Closed on Foam Pad - 2-3 reps - 1 sets - 10-30 SECONDS hold - 1x daily - 5x weekly  Backward Walking with Counter Support - 4 reps - 1 sets - 1x daily - 5x weekly  Walking with Head Rotation - 4 reps - 1 sets - 1x daily - 5x weekly  Seated Scapular Retraction - 10 reps - 3 sets - 1x daily - 3x weekly  Correct Seated Posture - 1x daily - 7x weekly  Supine Chin Tuck - 5 reps - 2 sets - 5 hold - 1x daily - 7x weekly  Standing on Foam Pad - 10 reps - 1 sets - 1x daily - 5x weekly

## 2018-02-05 NOTE — Patient Instructions (Signed)
  Please complete the assigned speech therapy homework prior to your next session and return it to the speech therapist at your next visit.  

## 2018-02-05 NOTE — Therapy (Signed)
[facility] [address] [place], [place], [address] Phone: [phone]   Fax:  [phone]  Speech Language Pathology Treatment  Patient Details  Name: [name] MRN: [id number] Date of Birth: [birth year] Referring Provider (SLP): Seymour Bars, NP   Encounter Date: 02/05/2018  End of Session - 02/05/18 1036    Visit Number  3    Number of Visits  17    Date for SLP Re-Evaluation  04/19/18    SLP Start Time  0805    SLP Stop Time   0847    SLP Time Calculation (min)  42 min    Activity Tolerance  Patient tolerated treatment well       Past Medical History:  Diagnosis Date  . Achilles tendinitis   . Achilles tendinitis   . ADHD (attention deficit hyperactivity disorder)   . Allergy   . Arthritis   . Bipolar affective ([place])   . Bipolar disorder ([place])   . Cataracts, bilateral   . Eczema   . Family history of breast cancer   . Family history of colon cancer   . Family history of genetic disease carrier   . Ganglion cyst 09/29/2009   left wrist (2 cyst)  . Hyperprolactinemia ([place])   . Hypertension   . Lipoma   . Migraine   . Personality disorder ([place])   . Pes planus     Past Surgical History:  Procedure Laterality Date  . ANKLE SURGERY  12/88   left   . chest nodule  1990?   rt chest wall nodule removal  . GANGLION CYST EXCISION  2011  . lipoma removal    . right bunioectomy    . SHOULDER SURGERY  01/13/2011   right, partial tear  . tumor resection left thigh      There were no vitals filed for this visit.  Subjective Assessment - 02/05/18 0825    Subjective  Pt arrived with her homework in hand, presented it to SLP.    Currently in Pain?  Yes    Pain Score  4     Pain Location  Ankle    Pain Orientation  Right    Pain Descriptors / Indicators  Sore    Pain Type  Acute pain    Pain Radiating Towards  foot    Pain Onset  1 to 4 weeks ago    Pain Frequency  Intermittent     Aggravating Factors   standing    Pain Relieving Factors  ice/heat, resting it            ADULT SLP TREATMENT - 02/05/18 0828      General Information   Behavior/Cognition  Alert;Cooperative;Other (comment)      Treatment Provided   Treatment provided  Cognitive-Linquistic      Cognitive-Linquistic Treatment   Treatment focused on  Aphasia    Skilled Treatment  Pt explained her homework to SLP (semantic web regarding "Avengers: Age of Ultron") with 4 anomic episodes in 30 minutes. However, with SLP sidebar questions pt easily was sidetracked into longer conversations taking her off of her instruction to "recap the movie for me." Pt did not heed nonverbal cues from SLP to return to topic/instructions > 1 time, and once she spontaneously returned to topic/instructions to "recap the movie". She will complete cognitive linguistic testing next session. Pt reported frustration re: what appeared to be attention at work - "getting distracted" per pt.  Assessment / Recommendations / Plan   Plan  Continue with current plan of care      Progression Toward Goals   Progression toward goals  Progressing toward goals         SLP Short Term Goals - 02/05/18 0829      SLP SHORT TERM GOAL #1   Title  pt will complete cognitive linguistic testing in the first 2 therapy sessions    Time  1    Period  --   visits   Status  On-going      SLP SHORT TERM GOAL #2   Title  pt will name average 14 items in common categories in 60 seconds    Time  3    Period  Weeks    Status  On-going      SLP SHORT TERM GOAL #3   Title  pt will successfully use at least 2 compensatory strategies for anomia multiple times in 10 mintues of conversation over two sessions    Baseline  02-05-18    Time  3    Period  Weeks    Status  Revised       SLP Long Term Goals - 02/05/18 1038      SLP LONG TERM GOAL #1   Title  pt will improve her [place] Naming Test score to at least 51/60 on her last day of  therapy    Time  7    Period  Weeks   17 sessions, for all LTGs   Status  On-going      SLP LONG TERM GOAL #2   Title  pt will use anomia compensations successfully in 15 minutes mod complex conversation over 3 therapy sessions    Time  7    Period  Weeks    Status  Revised       Plan - 02/05/18 1036    Clinical Impression Statement  Pt cont to present today with overt s/s anomia x4 during conversation with pt spontaneously using the synonym or circumlocution strategies for reported anomic episodes from pt (not detected by SLP due to successful compensation). She will undergo formal cognitive-linguistic testing next session. She would cont to benefit from skillled ST targeting improved verbal fluency as well as any cognitive communication deficits ID'd in formal cognitive linguistic testing.     Speech Therapy Frequency  2x / week    Duration  --   8 weeks/17 visits   Treatment/Interventions  Language facilitation;Environmental controls;Cueing hierarchy;SLP instruction and feedback;Compensatory strategies;Cognitive reorganization;Functional tasks;Internal/external aids;Multimodal communcation approach;Patient/family education    Potential to Achieve Goals  Good       Patient will benefit from skilled therapeutic intervention in order to improve the following deficits and impairments:   Aphasia  Cognitive communication deficit    Problem List Patient Active Problem List   Diagnosis Date Noted  . Family history of genetic disease carrier   . Family history of breast cancer   . Family history of colon cancer   . Head injury with loss of consciousness ([place]) 11/20/2017  . Nausea 11/20/2017  . 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 Tremor observed on examination 11/20/2017  . Left shoulder pain 02/18/2017  . MDD (major depressive disorder), recurrent severe, without psychosis ([place]) 12/28/2016   . Muscle strain 09/19/2016  . Right foot injury, subsequent encounter 08/25/2016  . Low back pain 06/27/2016  . Left wrist injury, subsequent encounter  06/27/2016  . Pain of left thumb 10/07/2015  . 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 Strain of left thumb 02/11/2015  . Strain of right forearm 02/11/2015  . Right shoulder pain 12/31/2014  . Injury of right little finger 12/31/2014  . Episodic cluster headache, 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  . 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 Hypersomnia, recurrent 12/02/2014  . Migraine aura, persistent, intractable, with status migrainosus 12/02/2014  . Lower back injury 09/30/2014  . Bipolar I disorder, most recent episode depressed ([place])   . MDD (major depressive disorder), recurrent, severe, with psychosis ([place]) 09/23/2014  . Injury of left index finger 08/20/2014  . Right ankle sprain 06/01/2014  . Contusion, multiple sites 06/01/2014  . Strain of right gastrocnemius muscle 06/01/2014  . Phonophobia 05/04/2014  . Photophobia of both eyes 05/04/2014  . Emotionally unstable borderline personality disorder ([place]) 05/04/2014  . Nausea with vomiting 05/04/2014  . Mixed bipolar I disorder ([place])   . Bipolar I disorder, most recent episode mixed ([place]) 04/18/2014  . Bipolar affective disorder, depressed, mild ([place]) 04/12/2014  . Suicidal ideation 04/12/2014  . Injury of right shoulder and upper arm 02/17/2014  . Left leg pain 06/03/2013  . Right hip pain 06/03/2013  . Migraine with status migrainosus 01/08/2013  . Personality disorder ([place])   . Chronic migraine 05/08/2012  . Contact dermatitis 11/27/2011  . Major depressive disorder, recurrent episode ([place]) 10/27/2011  . Generalized anxiety disorder 10/27/2011  . 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 Borderline personality disorder ([place]) 10/27/2011  . Right foot pain 09/28/2011   . 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 Ganglion cyst 09/29/2009  . PES PLANUS 07/01/2008  . BIPOLAR DISORDER UNSPECIFIED 06/09/2008    [facility] ,MS, CCC-SLP  02/05/2018, 10:42 AM  [facility] [address] [place], [place], [address] Phone: [phone]   Fax:  [phone]   Name: [name] MRN: [id number] Date of Birth: 03-21-1970

## 2018-02-07 ENCOUNTER — Ambulatory Visit: Payer: PPO | Admitting: Speech Pathology

## 2018-02-07 ENCOUNTER — Encounter: Payer: Self-pay | Admitting: Speech Pathology

## 2018-02-07 DIAGNOSIS — R41841 Cognitive communication deficit: Secondary | ICD-10-CM

## 2018-02-07 DIAGNOSIS — R42 Dizziness and giddiness: Secondary | ICD-10-CM | POA: Diagnosis not present

## 2018-02-07 DIAGNOSIS — R4701 Aphasia: Secondary | ICD-10-CM

## 2018-02-07 NOTE — Therapy (Signed)
[facility] [address] [place], [place], [address] Phone: [phone]   Fax:  [phone]  Speech Language Pathology Treatment  Patient Details  Name: [name] MRN: [id number] Date of Birth: Mar 31, 1970 Referring Provider (SLP): Seymour Bars, NP   Encounter Date: 02/07/2018  End of Session - 02/07/18 1156    Visit Number  4    Number of Visits  17    Date for SLP Re-Evaluation  04/19/18    SLP Start Time  0932    SLP Stop Time   6269    SLP Time Calculation (min)  42 min    Activity Tolerance  Patient tolerated treatment well       Past Medical History:  Diagnosis Date  . Achilles tendinitis   . Achilles tendinitis   . ADHD (attention deficit hyperactivity disorder)   . Allergy   . Arthritis   . Bipolar affective ([place])   . Bipolar disorder ([place])   . Cataracts, bilateral   . Eczema   . Family history of breast cancer   . Family history of colon cancer   . Family history of genetic disease carrier   . Ganglion cyst 09/29/2009   left wrist (2 cyst)  . Hyperprolactinemia ([place])   . Hypertension   . Lipoma   . Migraine   . Personality disorder ([place])   . Pes planus     Past Surgical History:  Procedure Laterality Date  . ANKLE SURGERY  12/88   left   . chest nodule  1990?   rt chest wall nodule removal  . GANGLION CYST EXCISION  2011  . lipoma removal    . right bunioectomy    . SHOULDER SURGERY  01/13/2011   right, partial tear  . tumor resection left thigh      There were no vitals filed for this visit.  Subjective Assessment - 02/07/18 0907    Subjective  Pt arrived 15 min late to [facility] SLP TREATMENT - 02/07/18 1133      General Information   Behavior/Cognition  Alert;Cooperative;Other (comment)      Treatment Provided   Treatment provided  Cognitive-Linquistic      Pain Assessment   Pain Assessment  No/denies pain      Cognitive-Linquistic Treatment    Treatment focused on  Cognition    Skilled Treatment  Initiated Cognitive Linguistic Quick Test (CLQT) due to pt c/o of difficulty attending at work and home. She also reports sensory hypersensitivity.  On symbol cancellation, pt stated she was done, however multilple designs left unidentified. Pt had more time allowed and was given allowed cue "Keep going. You have time left. She then went back and missed cancelling 1 before time was up. 1 subtest remaining as pt arrived late. Will complete CLQT next session. At this time, memory and language are low WNL, however aphasia evident in conversation. Provided handout of strategies for attention and sensory hypersensitivity. Briefly reviewed with pt, however she will require further instructions and task specific strategies for this.      Assessment / Recommendations / Plan   Plan  Continue with current plan of care      Progression Toward Goals   Progression toward goals  Progressing toward goals       SLP Education - 02/07/18 1152    Education Details  comepensations for attention and sensory hyersensitivity    Person(s) Educated  Patient    Methods  Explanation;Verbal cues;Handout    Comprehension  Verbalized understanding;Need further instruction       SLP Short Term Goals - 02/07/18 1156      SLP SHORT TERM GOAL #1   Title  pt will complete cognitive linguistic testing in the first 2 therapy sessions    Time  1    Period  --   visits   Status  Achieved      SLP SHORT TERM GOAL #2   Title  pt will name average 14 items in common categories in 60 seconds    Time  3    Period  Weeks    Status  On-going      SLP SHORT TERM GOAL #3   Title  pt will successfully use at least 2 compensatory strategies for anomia multiple times in 10 mintues of conversation over two sessions    Baseline  02-05-18    Time  3    Period  Weeks    Status  Revised       SLP Long Term Goals - 02/07/18 [place] #1   Title  pt will  improve her Boston Naming Test score to at least 51/60 on her last day of therapy    Time  7    Period  Weeks   17 sessions, for all LTGs   Status  On-going      SLP LONG TERM GOAL #2   Title  pt will use anomia compensations successfully in 15 minutes mod complex conversation over 3 therapy sessions    Time  7    Period  Weeks    Status  Revised       Plan - 02/07/18 1152    Clinical Impression Statement  Pt cotinues to demonstrate anomia in conersation.Pt reporting significant difficulty with attention and focus at work and home. Cognitive Linguistic Quick Test initiated. 1 subtest Radiographer, therapeutic) to be completed next session.  She would cont to benefit from skillled ST targeting improved verbal fluency as well as any cognitive communication deficits ID'd in formal cognitive linguistic testing.     Speech Therapy Frequency  2x / week    Duration  --   8 weeks/17 visits   Treatment/Interventions  Language facilitation;Environmental controls;Cueing hierarchy;SLP instruction and feedback;Compensatory strategies;Cognitive reorganization;Functional tasks;Internal/external aids;Multimodal communcation approach;Patient/family education    Potential to Achieve Goals  Good       Patient will benefit from skilled therapeutic intervention in order to improve the following deficits and impairments:   Aphasia  Cognitive communication deficit    Problem List Patient Active Problem List   Diagnosis Date Noted  . Family history of genetic disease carrier   . Family history of breast cancer   . Family history of colon cancer   . Head injury with loss of consciousness ([place]) 11/20/2017  . Nausea 11/20/2017  . 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 Tremor observed on examination 11/20/2017  . Left shoulder pain 02/18/2017  . MDD (major depressive disorder), recurrent severe, without psychosis ([place])  12/28/2016  . Muscle strain 09/19/2016  . Right foot injury, subsequent encounter 08/25/2016  . Low back pain 06/27/2016  . Left wrist injury, subsequent encounter 06/27/2016  . Pain of left thumb 10/07/2015  . 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 Strain of left thumb 02/11/2015  . Strain of  right forearm 02/11/2015  . Right shoulder pain 12/31/2014  . Injury of right little finger 12/31/2014  . Episodic cluster headache, 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  . 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 Hypersomnia, recurrent 12/02/2014  . Migraine aura, persistent, intractable, with status migrainosus 12/02/2014  . Lower back injury 09/30/2014  . Bipolar I disorder, most recent episode depressed ([place])   . MDD (major depressive disorder), recurrent, severe, with psychosis ([place]) 09/23/2014  . Injury of left index finger 08/20/2014  . Right ankle sprain 06/01/2014  . Contusion, multiple sites 06/01/2014  . Strain of right gastrocnemius muscle 06/01/2014  . Phonophobia 05/04/2014  . Photophobia of both eyes 05/04/2014  . Emotionally unstable borderline personality disorder ([place]) 05/04/2014  . Nausea with vomiting 05/04/2014  . Mixed bipolar I disorder ([place])   . Bipolar I disorder, most recent episode mixed ([place]) 04/18/2014  . Bipolar affective disorder, depressed, mild ([place]) 04/12/2014  . Suicidal ideation 04/12/2014  . Injury of right shoulder and upper arm 02/17/2014  . Left leg pain 06/03/2013  . Right hip pain 06/03/2013  . Migraine with status migrainosus 01/08/2013  . Personality disorder ([place])   . Chronic migraine 05/08/2012  . Contact dermatitis 11/27/2011  . Major depressive disorder, recurrent episode ([place]) 10/27/2011  . Generalized anxiety disorder 10/27/2011  . 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 Borderline personality disorder ([place]) 10/27/2011  . Right foot pain  09/28/2011  . 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 Ganglion cyst 09/29/2009  . PES PLANUS 07/01/2008  . BIPOLAR DISORDER UNSPECIFIED 06/09/2008    Lovvorn, Annye Rusk MS, CCC-SLP 02/07/2018, 11:58 AM  [facility] [address] [place], [place], [address] Phone: [phone]   Fax:  [phone]   Name: [name] MRN: [id number] Date of Birth: [birth year]

## 2018-02-07 NOTE — Patient Instructions (Signed)
   Tips to help facilitate better attention, concentration, focus   Do harder, longer tasks when you are most alert/awake  Break down larger tasks into small parts  Limit distractions of TV, radio, conversation, e mails/texts, appliance noise, etc - if a job is important, do it in a quiet room  Be aware of how you are functioning in high stimulation environments such as large stores, parties, restaurants - any place with lots of lights, noise, signs etc  Group conversations may be more difficult to process than one on one conversations  Give yourself extra time to process conversation, reading materials, directions or information from your healthcare providers  Organization is key - clutters of laundry, mail, paperwork, dirty dishes - all make it more difficult to concentrate  Before you start a task, have all the needed supplies, directions, recipes ready and organized. This way you don't have to go looking for something in the middle of a task and become distracted.   Be aware of fatigue - take rests or breaks when needed to re-group and re-focus  Make a list for stores and errands  You can use a ball cap, sunglasses, earplugs

## 2018-02-08 ENCOUNTER — Encounter: Payer: Self-pay | Admitting: Physical Therapy

## 2018-02-08 ENCOUNTER — Ambulatory Visit: Payer: PPO | Admitting: Physical Therapy

## 2018-02-08 DIAGNOSIS — R42 Dizziness and giddiness: Secondary | ICD-10-CM | POA: Diagnosis not present

## 2018-02-08 DIAGNOSIS — R296 Repeated falls: Secondary | ICD-10-CM

## 2018-02-08 DIAGNOSIS — M542 Cervicalgia: Secondary | ICD-10-CM

## 2018-02-08 DIAGNOSIS — R2681 Unsteadiness on feet: Secondary | ICD-10-CM

## 2018-02-08 NOTE — Patient Instructions (Addendum)
Trigger Point Dry Needling    What is Trigger Point Dry Needling (DN)? ? DN is a physical therapy technique used to treat muscle pain and dysfunction. Specifically, DN helps deactivate muscle trigger points (muscle knots).  ? A thin filiform needle is used to penetrate the skin and stimulate the underlying trigger point. The goal is for a local twitch response (LTR) to occur and for the trigger point to relax. No medication of any kind is injected during the procedure.     What Does Trigger Point Dry Needling Feel Like?  ? The procedure feels different for each individual patient. Some patients report that they do not actually feel the needle enter the skin and overall the process is not painful. Very mild bleeding may occur. However, many patients feel a deep cramping in the muscle in which the needle was inserted. This is the local twitch response.     How Will I feel after the treatment? ? Soreness is normal, and the onset of soreness may not occur for a few hours. Typically this soreness does not last longer than two days.  ? Bruising is uncommon, however; ice can be used to decrease any possible bruising.  ? In rare cases feeling tired or nauseous after the treatment is normal. In addition, your symptoms may get worse before they get better, this period will typically not last longer than 24 hours.     What Can I do After My Treatment? ? Increase your hydration by drinking more water for the next 24 hours. ? You may place ice or heat on the areas treated that have become sore, however, do not use heat on inflamed or bruised areas. Heat often brings more relief post needling. ? You can continue your regular activities, but vigorous activity is not recommended initially after the treatment for 24 hours. ? DN is best combined with other physical therapy such as strengthening, stretching, and other therapies.        Shoulder Roll    Move shoulders forward, up, back, then down.  Continue circling shoulders backward _10__ times. Repeat, circling shoulders forward. Do _2__ times per day.  Copyright  VHI. All rights reserved.  Flexibility: Upper Trapezius Stretch    Gently grasp right side of head while reaching behind back with other hand. Tilt head away until a gentle stretch is felt. Hold __20__ seconds.  Repeat on other side Repeat __2__ times per set.  Do __2__ sessions per day.

## 2018-02-08 NOTE — Therapy (Signed)
[facility] [address] [place], [place], [address] Phone: [phone]   Fax:  [phone]  Physical Therapy Treatment  Patient Details  Name: [name] MRN: [id number] Date of Birth: 11-Mar-1970 Referring Provider (PT): Larey Seat, MD   Encounter Date: 02/08/2018  PT End of Session - 02/08/18 1314    Visit Number  7    Number of Visits  17    Date for PT Re-Evaluation  03/03/18    Authorization Type  $15 copay; [employer].  VL: follow Medicare guidelines.  10th visit PN    PT Start Time  [phone]    PT Stop Time  0930    PT Time Calculation (min)  44 min    Equipment Utilized During Treatment  --   S prn   Activity Tolerance  Patient tolerated treatment well    Behavior During Therapy  WFL for tasks assessed/performed       Past Medical History:  Diagnosis Date  . Achilles tendinitis   . Achilles tendinitis   . ADHD (attention deficit hyperactivity disorder)   . Allergy   . Arthritis   . Bipolar affective ([place])   . Bipolar disorder ([place])   . Cataracts, bilateral   . Eczema   . Family history of breast cancer   . Family history of colon cancer   . Family history of genetic disease carrier   . Ganglion cyst 09/29/2009   left wrist (2 cyst)  . Hyperprolactinemia ([place])   . Hypertension   . Lipoma   . Migraine   . Personality disorder ([place])   . Pes planus     Past Surgical History:  Procedure Laterality Date  . ANKLE SURGERY  12/88   left   . chest nodule  1990?   rt chest wall nodule removal  . GANGLION CYST EXCISION  2011  . lipoma removal    . right bunioectomy    . SHOULDER SURGERY  01/13/2011   right, partial tear  . tumor resection left thigh      There were no vitals filed for this visit.  Subjective Assessment - 02/08/18 0924    Subjective  "I thought I had [name]."  Pt agreeable to dry needling after discussion and answering her questions.    Pertinent History  OT for  shoulder pain, PT at [facility] for hip; multiple falls, achilles tendonitis, ADHD, OA, bipolar disorder, cataracts bilaterally, eczema, HTN, lipoma with removal, migraine, personality disorder, suicide ideation, L ankle surgery, R bunionectomy, R shoulder surgery and L thigh tumor resection    Patient Stated Goals  To be more steady and to improve neck ROM    Currently in Pain?  No/denies    Pain Onset  1 to 4 weeks ago                       [facility] Adult PT Treatment/Exercise - 02/08/18 1307      Therapeutic Activites    Therapeutic Activities  Other Therapeutic Activities    Other Therapeutic Activities  education provided on purpose of TDN, common side effects and ways to manage side effects with use of heat and stretching      Exercises   Exercises  Neck      Neck Exercises: Seated   Shoulder Rolls  Backwards      Modalities   Modalities  Moist Heat      Moist Heat Therapy   Number Minutes  Moist Heat  10 Minutes    Moist Heat Location  Shoulder;Cervical      Neck Exercises: Stretches   Upper Trapezius Stretch  Right;Left;20 seconds       Trigger Point Dry Needling - 02/08/18 1306    Consent Given?  Yes    Education Handout Provided  Yes    Muscles Treated Upper Body  Upper trapezius    Upper Trapezius Response  Twitch reponse elicited        TDN TREATMENT:  1) Patient Consent: After explanation of TDN rationale, procedures, outcomes and potential side effects, patient verbalized consent to TDN treatment  2) Pre-treatment assessment:  Significant palpable trigger points and tenderness in bilat upper trap muscles  3) Muscles Treated: R/L upper trap in the following positions prone  4) 3 Needles used; size of needles .25 x 60  5) Post treatment  exercises performed as well as patient response to treatment: Pt reporting headache and nausea immediately post treatment.  Applied heat in supine and reviewed upper trap stretches and posterior shoulder rolls.   Improvement in nausea and HA at end of session.  6) Post treatment instructions: patient instructed to expect mild to moderate mm soreness this evening and tomorrow.  Pt instructed to continue prescribed HEP.  Pt also educate on signs and symptoms of infection, however unlikely, and to seek immediate medical attention should they occur.  Pt verbalized understanding of these instructions.     PT Education - 02/08/18 1313    Education provided  Yes    Education Details  see TA and information provided on TDN; added stretches to HEP for upper trap    Person(s) Educated  Patient    Methods  Explanation;Demonstration;Handout    Comprehension  Verbalized understanding       PT Short Term Goals - 02/01/18 1151      PT SHORT TERM GOAL #1   Title  Pt will participate in full vestibular assessment and will initiate vestibular HEP    Time  4    Period  Weeks    Status  Achieved      PT SHORT TERM GOAL #2   Title  Pt will participate in further balance assessment (gait velocity, FGA) and will initiate balance and strengthening HEP    Time  4    Period  Weeks    Status  Achieved      PT SHORT TERM GOAL #3   Title  Pt will demonstrate independence with initial cervical ROM and postural exercises for HEP    Time  4    Period  Weeks    Status  Partially Met      PT SHORT TERM GOAL #4   Title  Pt will improve cervical ROM by 5-8 degrees and report </= 4/10 pain with movement    Time  4    Period  Weeks    Status  Partially Met      PT SHORT TERM GOAL #5   Title  Pt will improve FGA score to >/=15/30 to decr. falls risk.     Status  Achieved        PT Long Term Goals - 02/08/18 1321      PT LONG TERM GOAL #1   Title  Pt will be independent with vestibular, balance, neck/posture HEP  (ALL LTG DUE BY 03/03/18)    Time  8    Period  Weeks    Status  New  PT LONG TERM GOAL #2   Title  Pt will demonstrate 10-12 deg increase in cervical spine ROM and </= 2/10 pain with movement     Time  8    Period  Weeks    Status  New      PT LONG TERM GOAL #3   Title  Pt will improve gait velocity to >/= 3.0 ft/sec with no evidence of imbalance or needing to touch walls/furniture    Baseline  TBD    Time  8    Period  Weeks    Status  New      PT LONG TERM GOAL #4   Title  Pt will improve FGA by 8 points to decrease falls risk during gait    Baseline  11/30 on 01/18/18    Time  8    Period  Weeks    Status  Revised      PT LONG TERM GOAL #5   Title  Vestibular goal if indicated            Plan - 02/08/18 1315    Clinical Impression Statement  Initiated dry needling treatment to decrease neck and shoulder tension, to improve ROM and decrease pain.  Pt experienced sympathetic response to needling and reported some nausea after treatment as well as a headache.  Provided pt with heat in supine while performing education and prescribing stretches.  Demonstrated stretches to patient.  At end of session pt reported nausea and headache had improved.  Will continue to assess response and progress towards LTG.    Rehab Potential  Good    PT Frequency  2x / week    PT Duration  8 weeks    PT Treatment/Interventions  ADLs/Self Care Home Management;Cryotherapy;Lobbyist;Therapeutic exercise;Therapeutic activities;Functional mobility training;Patient/family education;Manual techniques;Canalith Repostioning;Moist Heat;Gait training;Neuromuscular re-education;Passive range of motion;Dry needling;Vestibular    PT Next Visit Plan  How did pt feel after dry needling?  review upper trap stretch.  Continue dry needling and stretching of upper trap/levator/suboccipital? High level balance to encourage incr. vestibular input.     PT Home Exercise Plan  [id number]    Consulted and Agree with Plan of Care  Patient       Patient will benefit from skilled therapeutic intervention in order to improve the following deficits and impairments:  Decreased range of  motion, Difficulty walking, Increased muscle spasms, Pain, Decreased balance, Impaired flexibility, Postural dysfunction, Decreased strength, Decreased mobility, Decreased cognition, Dizziness  Visit Diagnosis: Cervicalgia  Dizziness and giddiness  Unsteadiness on feet  Repeated falls     Problem List Patient Active Problem List   Diagnosis Date Noted  . Family history of genetic disease carrier   . Family history of breast cancer   . Family history of colon cancer   . Head injury with loss of consciousness ([place]) 11/20/2017  . Nausea 11/20/2017  . 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 Tremor observed on examination 11/20/2017  . Left shoulder pain 02/18/2017  . MDD (major depressive disorder), recurrent severe, without psychosis ([place]) 12/28/2016  . Muscle strain 09/19/2016  . Right foot injury, subsequent encounter 08/25/2016  . Low back pain 06/27/2016  . Left wrist injury, subsequent encounter 06/27/2016  . Pain of left thumb 10/07/2015  . 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 Strain of left thumb 02/11/2015  . Strain of right forearm 02/11/2015  . Right shoulder pain 12/31/2014  .  Injury of right little finger 12/31/2014  . Episodic cluster headache, 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  . 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 Hypersomnia, recurrent 12/02/2014  . Migraine aura, persistent, intractable, with status migrainosus 12/02/2014  . Lower back injury 09/30/2014  . Bipolar I disorder, most recent episode depressed ([place])   . MDD (major depressive disorder), recurrent, severe, with psychosis ([place]) 09/23/2014  . Injury of left index finger 08/20/2014  . Right ankle sprain 06/01/2014  . Contusion, multiple sites 06/01/2014  . Strain of right gastrocnemius muscle 06/01/2014  . Phonophobia 05/04/2014  . Photophobia  of both eyes 05/04/2014  . Emotionally unstable borderline personality disorder ([place]) 05/04/2014  . Nausea with vomiting 05/04/2014  . Mixed bipolar I disorder ([place])   . Bipolar I disorder, most recent episode mixed ([place]) 04/18/2014  . Bipolar affective disorder, depressed, mild ([place]) 04/12/2014  . Suicidal ideation 04/12/2014  . Injury of right shoulder and upper arm 02/17/2014  . Left leg pain 06/03/2013  . Right hip pain 06/03/2013  . Migraine with status migrainosus 01/08/2013  . Personality disorder ([place])   . Chronic migraine 05/08/2012  . Contact dermatitis 11/27/2011  . Major depressive disorder, recurrent episode ([place]) 10/27/2011  . Generalized anxiety disorder 10/27/2011  . 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 Borderline personality disorder ([place]) 10/27/2011  . Right foot pain 09/28/2011  . 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 Ganglion cyst 09/29/2009  . PES PLANUS 07/01/2008  . BIPOLAR DISORDER UNSPECIFIED 06/09/2008    Rico Junker, PT, DPT 02/08/18    1:26 PM    [facility] [address] [place], [place], [address] Phone: [phone]   Fax:  [phone]  Name: [name] MRN: [id number] Date of Birth: [birth year]

## 2018-02-11 ENCOUNTER — Ambulatory Visit: Payer: PPO

## 2018-02-11 DIAGNOSIS — R42 Dizziness and giddiness: Secondary | ICD-10-CM | POA: Diagnosis not present

## 2018-02-11 DIAGNOSIS — R41841 Cognitive communication deficit: Secondary | ICD-10-CM

## 2018-02-11 DIAGNOSIS — R4701 Aphasia: Secondary | ICD-10-CM

## 2018-02-11 NOTE — Patient Instructions (Signed)
  Please complete the assigned speech therapy homework prior to your next session and return it to the speech therapist at your next visit.  

## 2018-02-11 NOTE — Therapy (Signed)
[facility] [address], [place], [address] Phone: [phone]   Fax:  [phone]  Speech Language Pathology Treatment  Patient Details  Name: [name] MRN: [id number] Date of Birth: 13-Jul-1970 Referring Provider (SLP): Joyce Bars, NP   Encounter Date: 02/11/2018  End of Session - 02/11/18 0943    Visit Number  5    Number of Visits  17    Date for SLP Re-Evaluation  04/19/18    SLP Start Time  0850    SLP Stop Time   0930    SLP Time Calculation (min)  40 min    Activity Tolerance  Patient tolerated treatment well       Past Medical History:  Diagnosis Date  . Achilles tendinitis   . Achilles tendinitis   . ADHD (attention deficit hyperactivity disorder)   . Allergy   . Arthritis   . Bipolar affective ([place])   . Bipolar disorder ([place])   . Cataracts, bilateral   . Eczema   . Family history of breast cancer   . Family history of colon cancer   . Family history of genetic disease carrier   . Ganglion cyst 09/29/2009   left wrist (2 cyst)  . Hyperprolactinemia ([place])   . Hypertension   . Lipoma   . Migraine   . Personality disorder ([place])   . Pes planus     Past Surgical History:  Procedure Laterality Date  . ANKLE SURGERY  12/88   left   . chest nodule  1990?   rt chest wall nodule removal  . GANGLION CYST EXCISION  2011  . lipoma removal    . right bunioectomy    . SHOULDER SURGERY  01/13/2011   right, partial tear  . tumor resection left thigh      There were no vitals filed for this visit.  Subjective Assessment - 02/11/18 0858    Subjective  Pt reports acute difficulty with language production over the weekend.  Requested lights be dimmed (suggested at last ST session). Pt stated easier for her to concentrate with this environmental modification.    Currently in Pain?  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 SLP asked pt what was the most challenging part of CLQT (executive function/reasoning). 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 SLP assisted pt in how to organize and problem solve how to accomplish this task with rare min-mod A. (preplan table vs. hang, obtain as many hangers as possible, etc.). SLP encouraged pt to write out her plan and put in her pocket and check off each step.        Assessment / Recommendations / Plan   Plan  Continue with current plan of care;Goals updated      Progression Toward Goals   Progression toward goals  Progressing toward goals       SLP Education -  02/11/18 0943    Education Details  eval results    Person(s) Educated  Patient    Methods  Explanation    Comprehension  Verbalized understanding       SLP Short Term Goals - 02/11/18 0944      SLP SHORT TERM GOAL #1   Title  pt will complete cognitive linguistic testing in the first 2 therapy sessions    Status  Achieved      SLP SHORT TERM GOAL #2   Title  pt will name average 14 items in common categories in 60 seconds    Time  2    Period  Weeks    Status  On-going      SLP SHORT TERM GOAL #3   Title  pt will successfully use at least 2 compensatory strategies for anomia multiple times in 10 mintues of conversation over two sessions    Baseline  02-05-18    Time  2    Status  On-going      SLP SHORT TERM GOAL #4   Title  pt will demo organization/executive function skills  adequate for simple functional tasks with rare min A    Time  2    Period  Weeks    Status  New      SLP SHORT TERM GOAL #5   Title  pt will report 2 functional memory compensations she uses between 3 sessions    Baseline  02-11-18    Time  2    Period  Weeks    Status  New      Additional Short Term Goals   Additional Short Term Goals  Yes      SLP SHORT TERM GOAL #6   Title  to improve pt's attention skills, pt will use compensations for attention in 2 sessions    Baseline  02-11-18    Time  2    Period  Weeks    Status  New       SLP Long Term Goals - 02/11/18 0951      SLP LONG TERM GOAL #1   Title  pt will improve her [place] Naming Test score to at least 51/60 on her last day of therapy    Time  6    Period  Weeks   17 sessions, for all LTGs   Status  On-going      SLP LONG TERM GOAL #2   Title  pt will use anomia compensations successfully in 15 minutes mod complex conversation over 3 therapy sessions    Time  6    Period  Weeks    Status  Revised      SLP LONG TERM GOAL #3   Title  pt will report successful usage of memory compensations between 3 sessions    Time  6    Period  Weeks    Status  New      SLP LONG TERM GOAL #4   Title  pt will perform executive function pertinent for everyday cognitive-linguistic tasks with modified independence in 3 sessions    Time  6    Period  Weeks    Status  New      SLP LONG TERM GOAL #5   Title  pt will demo functional divided attention skills with modified independence within 3 ST sessions    Time  6    Period  Weeks    Status  New       Plan -  02/11/18 0943    Clinical Impression Statement  Pt cotinues to demonstrate anomia in conersation. Pt reporting significant difficulty with attention and focus at work and home. Cognitive Linguistic Quick Test completed- see "skilled intervention" for details, goals updated/added. She would cont to benefit from skillled ST targeting improved verbal fluency as well as any  cognitive communication deficits ID'd in formal cognitive linguistic testing.     Speech Therapy Frequency  2x / week    Duration  --   8 weeks/17 visits   Treatment/Interventions  Language facilitation;Environmental controls;Cueing hierarchy;SLP instruction and feedback;Compensatory strategies;Cognitive reorganization;Functional tasks;Internal/external aids;Multimodal communcation approach;Patient/family education    Potential to Achieve Goals  Good       Patient will benefit from skilled therapeutic intervention in order to improve the following deficits and impairments:   Aphasia  Cognitive communication deficit    Problem List Patient Active Problem List   Diagnosis Date Noted  . Family history of genetic disease carrier   . Family history of breast cancer   . Family history of colon cancer   . Head injury with loss of consciousness ([place]) 11/20/2017  . Nausea 11/20/2017  . 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 Tremor observed on examination 11/20/2017  . Left shoulder pain 02/18/2017  . MDD (major depressive disorder), recurrent severe, without psychosis ([place]) 12/28/2016  . Muscle strain 09/19/2016  . Right foot injury, subsequent encounter 08/25/2016  . Low back pain 06/27/2016  . Left wrist injury, subsequent encounter 06/27/2016  . Pain of left thumb 10/07/2015  . 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 Strain of left thumb 02/11/2015  . Strain of right forearm 02/11/2015  . Right shoulder pain 12/31/2014  . Injury of right little finger 12/31/2014  . Episodic cluster headache, 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  . 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 Hypersomnia, recurrent 12/02/2014  . Migraine aura, persistent, intractable, with status migrainosus 12/02/2014  . Lower back injury 09/30/2014  . Bipolar I disorder,  most recent episode depressed ([place])   . MDD (major depressive disorder), recurrent, severe, with psychosis ([place]) 09/23/2014  . Injury of left index finger 08/20/2014  . Right ankle sprain 06/01/2014  . Contusion, multiple sites 06/01/2014  . Strain of right gastrocnemius muscle 06/01/2014  . Phonophobia 05/04/2014  . Photophobia of both eyes 05/04/2014  . Emotionally unstable borderline personality disorder ([place]) 05/04/2014  . Nausea with vomiting 05/04/2014  . Mixed bipolar I disorder ([place])   . Bipolar I disorder, most recent episode mixed ([place]) 04/18/2014  . Bipolar affective disorder, depressed, mild ([place]) 04/12/2014  . Suicidal ideation 04/12/2014  . Injury of right shoulder and upper arm 02/17/2014  . Left leg pain 06/03/2013  . Right hip pain 06/03/2013  . Migraine with status migrainosus 01/08/2013  . Personality disorder ([place])   . Chronic migraine 05/08/2012  . Contact dermatitis 11/27/2011  . Major depressive disorder, recurrent episode ([place]) 10/27/2011  . Generalized anxiety disorder 10/27/2011  . 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 Borderline personality disorder ([place]) 10/27/2011  . Right foot pain 09/28/2011  . 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 Ganglion cyst 09/29/2009  . PES PLANUS 07/01/2008  . BIPOLAR DISORDER UNSPECIFIED 06/09/2008    [facility] ,MS, CCC-SLP  02/11/2018, 9:56 AM  [facility] [place]  [place], [place], [address] Phone: [phone]   Fax:  [phone]   Name: [name] MRN: [id number] Date of Birth: 11-02-1970

## 2018-02-12 ENCOUNTER — Ambulatory Visit: Payer: PPO

## 2018-02-12 DIAGNOSIS — M6281 Muscle weakness (generalized): Secondary | ICD-10-CM

## 2018-02-12 DIAGNOSIS — M542 Cervicalgia: Secondary | ICD-10-CM

## 2018-02-12 DIAGNOSIS — R42 Dizziness and giddiness: Secondary | ICD-10-CM | POA: Diagnosis not present

## 2018-02-12 NOTE — Therapy (Addendum)
[facility] [address] [place], [place], [address] Phone: [phone]   Fax:  [phone]  Physical Therapy Treatment  Patient Details  Name: [name] MRN: [id number] Date of Birth: 1970-12-24 Referring Provider (PT): Larey Seat, MD   Encounter Date: 02/12/2018  PT End of Session - 02/12/18 1011    Visit Number  8    Number of Visits  17    Date for PT Re-Evaluation  03/03/18    Authorization Type  $15 copay; [employer].  VL: follow Medicare guidelines.  10th visit PN    PT Start Time  [phone]    PT Stop Time  0930    PT Time Calculation (min)  38 min    Activity Tolerance  Patient tolerated treatment well    Behavior During Therapy  WFL for tasks assessed/performed       Past Medical History:  Diagnosis Date  . Achilles tendinitis   . Achilles tendinitis   . ADHD (attention deficit hyperactivity disorder)   . Allergy   . Arthritis   . Bipolar affective ([place])   . Bipolar disorder ([place])   . Cataracts, bilateral   . Eczema   . Family history of breast cancer   . Family history of colon cancer   . Family history of genetic disease carrier   . Ganglion cyst 09/29/2009   left wrist (2 cyst)  . Hyperprolactinemia ([place])   . Hypertension   . Lipoma   . Migraine   . Personality disorder ([place])   . Pes planus     Past Surgical History:  Procedure Laterality Date  . ANKLE SURGERY  12/88   left   . chest nodule  1990?   rt chest wall nodule removal  . GANGLION CYST EXCISION  2011  . lipoma removal    . right bunioectomy    . SHOULDER SURGERY  01/13/2011   right, partial tear  . tumor resection left thigh      There were no vitals filed for this visit.  Subjective Assessment - 02/12/18 0855    Subjective  Pt denied falls since last visit. Pt reported R foot pain is incr. after working on feet for over 5 hours yesterday. She no longer has mats at work as they're trip hazard. Pt's  shoulder/neck pain feels much better after dry needling. Pt has difficulty performing a few exercises: cervical retraction and scapular retraction.     Pertinent History  OT for shoulder pain, PT at [facility] for hip; multiple falls, achilles tendonitis, ADHD, OA, bipolar disorder, cataracts bilaterally, eczema, HTN, lipoma with removal, migraine, personality disorder, suicide ideation, L ankle surgery, R bunionectomy, R shoulder surgery and L thigh tumor resection    Diagnostic tests  CT was negative for acute changes, neurology has ordered and MRI - not performed yet    Patient Stated Goals  To be more steady and to improve neck ROM    Currently in Pain?  Yes    Pain Score  5     Pain Location  Foot    Pain Orientation  Right    Pain Descriptors / Indicators  Constant;Dull;Aching    Pain Type  Chronic pain    Pain Onset  1 to 4 weeks ago    Pain Frequency  Constant    Aggravating Factors   standing/walking    Pain Relieving Factors  rest           Access Code: [email or web address]  URL: [email or web address]  Date: 02/12/2018  Prepared by: Geoffry Paradise   Exercises  Seated Scapular Retraction - 10 reps - 3 sets - 1x daily - 3x weekly (trialed red band to decr. B shoulder shrug vs. Green band) Correct Seated Posture - 1x daily - 7x weekly  Supine and seated Chin Tuck - 5 reps - 2 sets - 5 hold - 1x daily - 7x weekly   Seated Backward Shoulder Rolls - 10 reps - 2x daily - 7x weekly  Seated Upper Trapezius Stretch - 2 reps - 20 second hold - 2x daily - 7x weekly   Cues and demo for technique. Pt denied dizziness or incr. Pain during session. Updated HEP as indicated.                     Self Care: PT Education - 02/12/18 1010    Education provided  Yes    Education Details  PT reviewed HEP (per pt request and required extensive cues) and modified as indicated. 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 foot pain during  work. PT also discussed pt checking with boss regarding placing mats back behind register to decr. pt's pain and to discuss potential seated breaks during work.     Person(s) Educated  Patient    Methods  Explanation;Demonstration;Tactile cues;Verbal cues;Handout    Comprehension  Returned demonstration;Verbalized understanding       PT Short Term Goals - 02/01/18 1151      PT SHORT TERM GOAL #1   Title  Pt will participate in full vestibular assessment and will initiate vestibular HEP    Time  4    Period  Weeks    Status  Achieved      PT SHORT TERM GOAL #2   Title  Pt will participate in further balance assessment (gait velocity, FGA) and will initiate balance and strengthening HEP    Time  4    Period  Weeks    Status  Achieved      PT SHORT TERM GOAL #3   Title  Pt will demonstrate independence with initial cervical ROM and postural exercises for HEP    Time  4    Period  Weeks    Status  Partially Met      PT SHORT TERM GOAL #4   Title  Pt will improve cervical ROM by 5-8 degrees and report </= 4/10 pain with movement    Time  4    Period  Weeks    Status  Partially Met      PT SHORT TERM GOAL #5   Title  Pt will improve FGA score to >/=15/30 to decr. falls risk.     Status  Achieved        PT Long Term Goals - 02/08/18 1321      PT LONG TERM GOAL #1   Title  Pt will be independent with vestibular, balance, neck/posture HEP  (ALL LTG DUE BY 03/03/18)    Time  8    Period  Weeks    Status  New      PT LONG TERM GOAL #2   Title  Pt will demonstrate 10-12 deg increase in cervical spine ROM and </= 2/10 pain with movement    Time  8    Period  Weeks    Status  New      PT LONG TERM GOAL #3   Title  Pt  will improve gait velocity to >/= 3.0 ft/sec with no evidence of imbalance or needing to touch walls/furniture    Baseline  TBD    Time  8    Period  Weeks    Status  New      PT LONG TERM GOAL #4   Title  Pt will improve FGA by 8 points to decrease falls  risk during gait    Baseline  11/30 on 01/18/18    Time  8    Period  Weeks    Status  Revised      PT LONG TERM GOAL #5   Title  Vestibular goal if indicated            Plan - 02/12/18 0903    Clinical Impression Statement  Today's skilled session focused on reviewing HEP to ensure proper technique, as pt reported she was having difficulty remembering how to perform cervical spine, scapular retraction, and postural exercises/stretches. Pt demonstrated progress, as she was able to perform HEP without cues at end of session-after PT providing extensive verbal and tactile cues. Pt would continue to benefit from skilled PT to improve safety during functional mobililty.     Rehab Potential  Good    PT Frequency  2x / week    PT Duration  8 weeks    PT Treatment/Interventions  ADLs/Self Care Home Management;Cryotherapy;Lobbyist;Therapeutic exercise;Therapeutic activities;Functional mobility training;Patient/family education;Manual techniques;Canalith Repostioning;Moist Heat;Gait training;Neuromuscular re-education;Passive range of motion;Dry needling;Vestibular    PT Next Visit Plan  Continue dry needling and stretching of upper trap/levator/suboccipital? High level balance to encourage incr. vestibular input.     PT Home Exercise Plan  [email or web address]    Consulted and Agree with Plan of Care  Patient       Patient will benefit from skilled therapeutic intervention in order to improve the following deficits and impairments:  Decreased range of motion, Difficulty walking, Increased muscle spasms, Pain, Decreased balance, Impaired flexibility, Postural dysfunction, Decreased strength, Decreased mobility, Decreased cognition, Dizziness  Visit Diagnosis: Cervicalgia  Dizziness and giddiness  Muscle weakness (generalized)     Problem List Patient Active Problem List   Diagnosis Date Noted  . Family history of genetic disease carrier   . Family history of  breast cancer   . Family history of colon cancer   . Head injury with loss of consciousness ([place]) 11/20/2017  . Nausea 11/20/2017  . 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 Tremor observed on examination 11/20/2017  . Left shoulder pain 02/18/2017  . MDD (major depressive disorder), recurrent severe, without psychosis ([place]) 12/28/2016  . Muscle strain 09/19/2016  . Right foot injury, subsequent encounter 08/25/2016  . Low back pain 06/27/2016  . Left wrist injury, subsequent encounter 06/27/2016  . Pain of left thumb 10/07/2015  . 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 Strain of left thumb 02/11/2015  . Strain of right forearm 02/11/2015  . Right shoulder pain 12/31/2014  . Injury of right little finger 12/31/2014  . Episodic cluster headache, 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  . 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 Hypersomnia, recurrent 12/02/2014  . Migraine aura, persistent, intractable, with status migrainosus 12/02/2014  . Lower back injury 09/30/2014  . Bipolar I disorder, most recent episode depressed ([place])   . MDD (major depressive disorder), recurrent, severe, with psychosis ([place]) 09/23/2014  . Injury of left index finger 08/20/2014  .  Right ankle sprain 06/01/2014  . Contusion, multiple sites 06/01/2014  . Strain of right gastrocnemius muscle 06/01/2014  . Phonophobia 05/04/2014  . Photophobia of both eyes 05/04/2014  . Emotionally unstable borderline personality disorder ([place]) 05/04/2014  . Nausea with vomiting 05/04/2014  . Mixed bipolar I disorder ([place])   . Bipolar I disorder, most recent episode mixed ([place]) 04/18/2014  . Bipolar affective disorder, depressed, mild ([place]) 04/12/2014  . Suicidal ideation 04/12/2014  . Injury of right shoulder and upper arm 02/17/2014  . Left leg pain 06/03/2013  . Right hip pain  06/03/2013  . Migraine with status migrainosus 01/08/2013  . Personality disorder ([place])   . Chronic migraine 05/08/2012  . Contact dermatitis 11/27/2011  . Major depressive disorder, recurrent episode ([place]) 10/27/2011  . Generalized anxiety disorder 10/27/2011  . 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 Borderline personality disorder ([place]) 10/27/2011  . Right foot pain 09/28/2011  . 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 Ganglion cyst 09/29/2009  . PES PLANUS 07/01/2008  . BIPOLAR DISORDER UNSPECIFIED 06/09/2008    , L 02/12/2018, 10:18 AM  [facility] [address] [place], [place], [address] Phone: [phone]   Fax:  [phone]  Name: [name] MRN: [id number] Date of Birth: 1970/09/02  Geoffry Paradise, PT,DPT 02/12/18 10:18 AM Phone: [phone] Fax: [phone]

## 2018-02-12 NOTE — Patient Instructions (Signed)
Access Code: [email or web address]  URL: [email or web address]  Date: 02/12/2018  Prepared by: Geoffry Paradise   Exercises  Seated Scapular Retraction - 10 reps - 3 sets - 1x daily - 3x weekly  Correct Seated Posture - 1x daily - 7x weekly  Supine and seated Chin Tuck - 5 reps - 2 sets - 5 hold - 1x daily - 7x weekly   Seated Backward Shoulder Rolls - 10 reps - 2x daily - 7x weekly  Seated Upper Trapezius Stretch - 2 reps - 20 second hold - 2x daily - 7x weekly

## 2018-02-13 ENCOUNTER — Ambulatory Visit: Payer: PPO | Admitting: Family Medicine

## 2018-02-13 ENCOUNTER — Encounter: Payer: Self-pay | Admitting: Family Medicine

## 2018-02-13 VITALS — BP 122/82 | HR 68 | Ht 70.0 in | Wt 250.0 lb

## 2018-02-13 DIAGNOSIS — M79604 Pain in right leg: Secondary | ICD-10-CM

## 2018-02-13 NOTE — Progress Notes (Signed)
PCP: Aretta Nip, MD  Subjective:   HPI: Patient is a 48 y.o. female here for right foot injury.  01/15/18: Patient reports yesterday on December 16 she was walking with a fast pace and accidentally kicked something. This caused immediate pain in the dorsal aspect of her right foot but also pain radiating up her right calf, leg to her hip. Pain is been throbbing in her foot at a 7 out of 10 level. She is used heat which helps some but does not resolve the pain. Not tried any medication for this yet. Associated bruising in the dorsal aspect of her foot but no numbness.  02/13/18: Patient reports mild improvement since last visit. Pain level down to 4/10. She wears cam walker at work - worse when she's at work on feet a lot. Taking naproxen which helps. No skin changes, numbness.  Past Medical History:  Diagnosis Date  . Achilles tendinitis   . Achilles tendinitis   . ADHD (attention deficit hyperactivity disorder)   . Allergy   . Arthritis   . Bipolar affective ([place])   . Bipolar disorder ([place])   . Cataracts, bilateral   . Eczema   . Family history of breast cancer   . Family history of colon cancer   . Family history of genetic disease carrier   . Ganglion cyst 09/29/2009   left wrist (2 cyst)  . Hyperprolactinemia ([place])   . Hypertension   . Lipoma   . Migraine   . Personality disorder ([place])   . Pes planus     Current Outpatient Medications on File Prior to Visit  Medication Sig Dispense Refill  . clobetasol cream (TEMOVATE) 0.05 %   3  . divalproex (DEPAKOTE ER) 500 MG 24 hr tablet TAKE 2 TABLETS BY MOUTH AT BEDTIME FOR MOOD CONTROL 60 tablet 0  . Galcanezumab-gnlm (EMGALITY) 120 MG/ML SOAJ Inject 120 mg into the skin every 30 (thirty) days. 1 mL 11  . hydrochlorothiazide (HYDRODIURIL) 25 MG tablet Take 25 mg by mouth as needed.  1  . hydrOXYzine (VISTARIL) 50 MG capsule TK ONE C PO TID PRA  2  . lamoTRIgine (LAMICTAL) 150 MG tablet Take 300 mg by mouth at  bedtime.  1  . levocetirizine (XYZAL) 5 MG tablet Take 5 mg by mouth every evening.  1  . lurasidone (LATUDA) 80 MG TABS tablet Take 1 tablet (80 mg total) by mouth daily with supper. For mood control 30 tablet 0  . metoprolol succinate (TOPROL-XL) 100 MG 24 hr tablet Take 1 tablet (100 mg total) by mouth daily. Take with or immediately following a meal. 30 tablet 0  . naproxen (NAPROSYN) 500 MG tablet Take 1 tablet (500 mg total) by mouth 2 (two) times daily as needed. 60 tablet 2  . promethazine (PHENERGAN) 25 MG tablet TK 1 T PO BID FOR 15 DAYS PRN  0  . SUMAtriptan Succinate Refill 6 MG/0.5ML SOCT Inject 0.5 ml SQ at the onset of migraine. May repeat in 2 hours if needed. 0.5 mL 5  . traZODone (DESYREL) 50 MG tablet Take 1 tablet (50 mg total) by mouth at bedtime as needed for sleep. (Patient taking differently: Take 100 mg by mouth. ) 30 tablet 0   Current Facility-Administered Medications on File Prior to Visit  Medication Dose Route Frequency Provider Last Rate Last Dose  . methylPREDNISolone acetate (DEPO-MEDROL) injection 40 mg  40 mg Intra-articular Once Jasher Barkan, Sharyn Lull, MD      . valproate (DEPACON)  1,000 mg in dextrose 5 % 50 mL IVPB  1,000 mg Intravenous Once Dohmeier, Asencion Partridge, MD        Past Surgical History:  Procedure Laterality Date  . ANKLE SURGERY  12/88   left   . chest nodule  1990?   rt chest wall nodule removal  . GANGLION CYST EXCISION  2011  . lipoma removal    . right bunioectomy    . SHOULDER SURGERY  01/13/2011   right, partial tear  . tumor resection left thigh      Allergies  Allergen Reactions  . Adhesive [Tape] Itching and Rash    Also reacted to Steri Strips and Band-Aids.  . Dilaudid [Hydromorphone Hcl] Itching  . Morphine Nausea And Vomiting  . Penicillins Hives    Has patient had a PCN reaction causing immediate rash, facial/tongue/throat swelling, SOB or lightheadedness with hypotension: YES Has patient had a PCN reaction causing severe rash  involving mucus membranes or skin necrosis: NO Has patient had a PCN reaction that required hospitalization NO Has patient had a PCN reaction occurring within the last 10 years:NO If all of the above answers are "NO", then may proceed with Cephalosporin use.  Marland Kitchen Percocet [Oxycodone-Acetaminophen] Itching  . Prednisone Hives  . Provera [Medroxyprogesterone Acetate] Other (See Comments)    Causes manic episodes  . Ultram [Tramadol Hcl] Itching    Social History   Socioeconomic History  . Marital status: Single    Spouse name: Not on file  . Number of children: 0  . Years of education: Not on file  . Highest education level: Not on file  Occupational History    Employer: [employer]  . Financial resource strain: Not on file  . Food insecurity:    Worry: Not on file    Inability: Not on file  . Transportation needs:    Medical: Not on file    Non-medical: Not on file  Tobacco Use  . Smoking status: Never Smoker  . Smokeless tobacco: Never Used  Substance and Sexual Activity  . Alcohol use: No    Alcohol/week: 0.0 standard drinks  . Drug use: No  . Sexual activity: Never    Birth control/protection: Pill  Lifestyle  . Physical activity:    Days per week: Not on file    Minutes per session: Not on file  . Stress: Not on file  Relationships  . Social connections:    Talks on phone: Not on file    Gets together: Not on file    Attends religious service: Not on file    Active member of club or organization: Not on file    Attends meetings of clubs or organizations: Not on file    Relationship status: Not on file  . Intimate partner violence:    Fear of current or ex partner: Not on file    Emotionally abused: Not on file    Physically abused: Not on file    Forced sexual activity: Not on file  Other Topics Concern  . Not on file  Social History Narrative   Caffeine  2 sodas daily, 1 cup coffee daily.    Family History  Problem Relation Age of Onset  .  Hypertension Mother   . Hyperlipidemia Mother   . 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 Heart attack Father   . Heart disease Father   . Hypertension Father   .  Bipolar disorder Father   . Diabetes Paternal Grandfather   . Heart disease Maternal Aunt   . Breast cancer Maternal Aunt 65  . Heart disease Maternal Grandmother   . Colon cancer Maternal Grandmother 66  . Colon cancer Maternal Uncle 75  . Lymphoma Maternal Grandfather 21  . Breast cancer Maternal Aunt 65  . Breast cancer Maternal Aunt 35  . Breast cancer Maternal Aunt   . Bone cancer Cousin 12  . 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     Objective:  Physical Exam:  Gen: NAD, comfortable in exam room  Right foot/ankle: No gross deformity, swelling, ecchymoses FROM with 5/5 strength all directions. TTP diffusely about foot, ankle without focal tenderness. Negative ant drawer and talar tilt.   Negative syndesmotic compression. Thompsons test negative. NV intact distally.  Right knee/lower leg: No gross deformity, ecchymoses, swelling. No TTP FROM with 5/5 strength. Negative ant/post drawers. Negative valgus/varus testing. Negative lachmans. Negative mcmurrays, apleys. NV intact distally.  Right hip: No deformity. FROM with 5/5 strength.  Assessment & Plan:  1. Right leg injury - improving.  No concerning findings on exam or symptoms.  Continue naproxen twice a day, icing.  Cam walker if needed when up and walking.  F/u in 1 month.

## 2018-02-13 NOTE — Patient Instructions (Signed)
Let's give this another month and reevaluate - you're slowly improving. Take naproxen 500mg  twice a day with food then as needed. Consider boot when up and walking around for comfort.  Laceup brace is a consideration but I don't think this will accelerate the healing process. Icing 15 minutes at a time 3-4 times a day. Follow up with me in 1 month.

## 2018-02-14 ENCOUNTER — Ambulatory Visit: Payer: PPO | Admitting: Physical Therapy

## 2018-02-15 ENCOUNTER — Ambulatory Visit: Payer: PPO | Admitting: Physical Therapy

## 2018-02-15 ENCOUNTER — Ambulatory Visit: Payer: PPO

## 2018-02-15 ENCOUNTER — Encounter: Payer: Self-pay | Admitting: Physical Therapy

## 2018-02-15 DIAGNOSIS — M6281 Muscle weakness (generalized): Secondary | ICD-10-CM

## 2018-02-15 DIAGNOSIS — R4701 Aphasia: Secondary | ICD-10-CM

## 2018-02-15 DIAGNOSIS — M542 Cervicalgia: Secondary | ICD-10-CM

## 2018-02-15 DIAGNOSIS — R41841 Cognitive communication deficit: Secondary | ICD-10-CM

## 2018-02-15 DIAGNOSIS — R42 Dizziness and giddiness: Secondary | ICD-10-CM

## 2018-02-15 NOTE — Patient Instructions (Signed)
Please bring your itinerary next session.

## 2018-02-15 NOTE — Therapy (Signed)
[facility] [address] [place], [place], [address] Phone: [phone]   Fax:  [phone]  Speech Language Pathology Treatment  Patient Details  Name: [name] MRN: [id number] Date of Birth: 13-Sep-1970 Referring Provider (SLP): Seymour Bars, NP   Encounter Date: 02/15/2018  End of Session - 02/15/18 1241    Visit Number  6    Number of Visits  17    Date for SLP Re-Evaluation  04/19/18    SLP Start Time  0805    SLP Stop Time   0845    SLP Time Calculation (min)  40 min    Activity Tolerance  Patient tolerated treatment well       Past Medical History:  Diagnosis Date  . Achilles tendinitis   . Achilles tendinitis   . ADHD (attention deficit hyperactivity disorder)   . Allergy   . Arthritis   . Bipolar affective ([place])   . Bipolar disorder ([place])   . Cataracts, bilateral   . Eczema   . Family history of breast cancer   . Family history of colon cancer   . Family history of genetic disease carrier   . Ganglion cyst 09/29/2009   left wrist (2 cyst)  . Hyperprolactinemia ([place])   . Hypertension   . Lipoma   . Migraine   . Personality disorder ([place])   . Pes planus     Past Surgical History:  Procedure Laterality Date  . ANKLE SURGERY  12/88   left   . chest nodule  1990?   rt chest wall nodule removal  . GANGLION CYST EXCISION  2011  . lipoma removal    . right bunioectomy    . SHOULDER SURGERY  01/13/2011   right, partial tear  . tumor resection left thigh      There were no vitals filed for this visit.  Subjective Assessment - 02/15/18 0815    Subjective  "Can you believe I forgot my homework?"    Currently in Pain?  Yes    Pain Score  3     Pain Location  Foot    Pain Orientation  Right    Pain Descriptors / Indicators  Dull;Aching    Pain Type  Chronic pain    Pain Onset  1 to 4 weeks ago    Pain Frequency  Constant    Aggravating Factors   movement    Pain Relieving  Factors  resting             ADULT SLP TREATMENT - 02/15/18 0831      General Information   Behavior/Cognition  Alert;Cooperative      Treatment Provided   Treatment provided  Cognitive-Linquistic      Cognitive-Linquistic Treatment   Treatment focused on  Cognition    Skilled Treatment  Pt reports her planner assists her in recalling her work schedule, therapy appointments, mom's appointments. SLP educated/re-educated pt re: compensations for attention and for executive function - pre-planning tasks, write a schedule down and stick to it. SLP suggested to pt she set alarms on her phone for one med that she occasionally forgets to take. Pt demonstrated alternating attention in somewhat simple tasks during the session today with extra time.  Pt with three anomic situations today in which she compensated within a socially acceptable/appropriate time.       Assessment / Recommendations / Plan   Plan  Continue with current plan of care  Progression Toward Goals   Progression toward goals  Progressing toward goals       SLP Education - 02/15/18 1245    Education Details  use alarm to alert you when to take your one medication you have trouble remembering to take, attention and organization strategies    Person(s) Educated  Patient    Methods  Explanation    Comprehension  Verbalized understanding       SLP Short Term Goals - 02/15/18 0837      SLP SHORT TERM GOAL #1   Title  pt will complete cognitive linguistic testing in the first 2 therapy sessions    Status  Achieved      SLP SHORT TERM GOAL #2   Title  pt will name average 14 items in common categories in 60 seconds    Time  2    Period  Weeks    Status  On-going      SLP SHORT TERM GOAL #3   Title  pt will successfully use at least 2 compensatory strategies for anomia multiple times in 10 mintues of conversation over two sessions    Baseline  02-05-18    Time  2    Status  On-going      SLP SHORT TERM GOAL #4    Title  pt will demo organization/executive function skills adequate for simple functional tasks with rare min A    Time  2    Period  Weeks    Status  New      SLP SHORT TERM GOAL #5   Title  pt will report 2 functional memory compensations she uses between 3 sessions    Baseline  02-11-18, 02-15-18,    Time  2    Period  Weeks    Status  New      SLP SHORT TERM GOAL #6   Title  to improve pt's attention skills, pt will use compensations for attention in 2 sessions    Baseline  02-11-18    Time  2    Period  Weeks    Status  New       SLP Long Term Goals - 02/15/18 1962      SLP LONG TERM GOAL #1   Title  pt will improve her [place] Naming Test score to at least 51/60 on her last day of therapy    Time  6    Period  Weeks   17 sessions, for all LTGs   Status  On-going      SLP LONG TERM GOAL #2   Title  pt will use anomia compensations successfully in 15 minutes mod complex conversation over 3 therapy sessions    Baseline  02-15-18    Time  6    Period  Weeks    Status  Revised      SLP LONG TERM GOAL #3   Title  pt will report successful usage of memory compensations between 3 sessions    Time  6    Period  Weeks    Status  New      SLP LONG TERM GOAL #4   Title  pt will perform executive function pertinent for everyday cognitive-linguistic tasks with modified independence in 3 sessions    Time  6    Period  Weeks    Status  New      SLP LONG TERM GOAL #5   Title  pt will demo functional divided attention skills with modified  independence within 3 ST sessions    Time  6    Period  Weeks    Status  New       Plan - 02/15/18 1245    Clinical Impression Statement  Pt cotinues to demonstrate mild anomia in conersation, which she compensated for today rather well. Given pt's reporting significant difficulty with attention and focus at work and home SLP educated pt re: attention and organization strategies.  She would cont to benefit from skillled ST targeting  improved verbal fluency as well as any cognitive communication deficits ID'd in formal cognitive linguistic testing.     Speech Therapy Frequency  2x / week    Duration  --   8 weeks/17 visits   Treatment/Interventions  Language facilitation;Environmental controls;Cueing hierarchy;SLP instruction and feedback;Compensatory strategies;Cognitive reorganization;Functional tasks;Internal/external aids;Multimodal communcation approach;Patient/family education    Potential to Achieve Goals  Good       Patient will benefit from skilled therapeutic intervention in order to improve the following deficits and impairments:   Cognitive communication deficit  Aphasia    Problem List Patient Active Problem List   Diagnosis Date Noted  . Family history of genetic disease carrier   . Family history of breast cancer   . Family history of colon cancer   . Head injury with loss of consciousness ([place]) 11/20/2017  . Nausea 11/20/2017  . 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 Tremor observed on examination 11/20/2017  . Left shoulder pain 02/18/2017  . MDD (major depressive disorder), recurrent severe, without psychosis ([place]) 12/28/2016  . Muscle strain 09/19/2016  . Right foot injury, subsequent encounter 08/25/2016  . Low back pain 06/27/2016  . Left wrist injury, subsequent encounter 06/27/2016  . Pain of left thumb 10/07/2015  . 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 Strain of left thumb 02/11/2015  . Strain of right forearm 02/11/2015  . Right shoulder pain 12/31/2014  . Injury of right little finger 12/31/2014  . Episodic cluster headache, 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  . 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 Hypersomnia, recurrent 12/02/2014  . Migraine aura, persistent, intractable, with status migrainosus 12/02/2014  . Lower back  injury 09/30/2014  . Bipolar I disorder, most recent episode depressed ([place])   . MDD (major depressive disorder), recurrent, severe, with psychosis ([place]) 09/23/2014  . Injury of left index finger 08/20/2014  . Right ankle sprain 06/01/2014  . Contusion, multiple sites 06/01/2014  . Strain of right gastrocnemius muscle 06/01/2014  . Phonophobia 05/04/2014  . Photophobia of both eyes 05/04/2014  . Emotionally unstable borderline personality disorder ([place]) 05/04/2014  . Nausea with vomiting 05/04/2014  . Mixed bipolar I disorder ([place])   . Bipolar I disorder, most recent episode mixed ([place]) 04/18/2014  . Bipolar affective disorder, depressed, mild ([place]) 04/12/2014  . Suicidal ideation 04/12/2014  . Injury of right shoulder and upper arm 02/17/2014  . Left leg pain 06/03/2013  . Right hip pain 06/03/2013  . Migraine with status migrainosus 01/08/2013  . Personality disorder ([place])   . Chronic migraine 05/08/2012  . Contact dermatitis 11/27/2011  . Major depressive disorder, recurrent episode ([place]) 10/27/2011  . Generalized anxiety disorder 10/27/2011  . 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 Borderline personality disorder ([place]) 10/27/2011  . Right foot pain 09/28/2011  . 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  Ganglion cyst 09/29/2009  . PES PLANUS 07/01/2008  . BIPOLAR DISORDER UNSPECIFIED 06/09/2008    [facility] ,MS, CCC-SLP  02/15/2018, 12:49 PM  [facility] [address] [place], [place], [address] Phone: [phone]   Fax:  [phone]   Name: [name] MRN: [id number] Date of Birth: 22-Apr-1970

## 2018-02-15 NOTE — Therapy (Signed)
[facility] [address] [place], [place], [address] Phone: [phone]   Fax:  [phone]  Physical Therapy Treatment  Patient Details  Name: [name] MRN: [id number] Date of Birth: 07/04/1970 Referring Provider (PT): Carmen Dohmeier, MD   Encounter Date: 02/15/2018  PT End of Session - 02/15/18 1307    Visit Number  9    Number of Visits  17    Date for PT Re-Evaluation  03/03/18    Authorization Type  $15 copay; [employer].  VL: follow Medicare guidelines.  10th visit PN    PT Start Time  0851    PT Stop Time  0933    PT Time Calculation (min)  42 min    Activity Tolerance  Patient tolerated treatment well    Behavior During Therapy  WFL for tasks assessed/performed       Past Medical History:  Diagnosis Date  . Achilles tendinitis   . Achilles tendinitis   . ADHD (attention deficit hyperactivity disorder)   . Allergy   . Arthritis   . Bipolar affective (HCC)   . Bipolar disorder (HCC)   . Cataracts, bilateral   . Eczema   . Family history of breast cancer   . Family history of colon cancer   . Family history of genetic disease carrier   . Ganglion cyst 09/29/2009   left wrist (2 cyst)  . Hyperprolactinemia (HCC)   . Hypertension   . Lipoma   . Migraine   . Personality disorder (HCC)   . Pes planus     Past Surgical History:  Procedure Laterality Date  . ANKLE SURGERY  12/88   left   . chest nodule  1990?   rt chest wall nodule removal  . GANGLION CYST EXCISION  2011  . lipoma removal    . right bunioectomy    . SHOULDER SURGERY  01/13/2011   right, partial tear  . tumor resection left thigh      There were no vitals filed for this visit.  Subjective Assessment - 02/15/18 0855    Subjective  After last dry needling session pt was sore for about a day.  Has had a very stressful week so she can feel shoulder tightening up again.  Mother is having surgery on Tuesday for cancer.     Pertinent History  OT for shoulder pain, PT at [facility] for hip; multiple falls, achilles tendonitis, ADHD, OA, bipolar disorder, cataracts bilaterally, eczema, HTN, lipoma with removal, migraine, personality disorder, suicide ideation, L ankle surgery, R bunionectomy, R shoulder surgery and L thigh tumor resection    Diagnostic tests  CT was negative for acute changes, neurology has ordered and MRI - not performed yet    Patient Stated Goals  To be more steady and to improve neck ROM    Currently in Pain?  Yes    Pain Score  3     Pain Location  Foot    Pain Orientation  Right    Pain Descriptors / Indicators  Aching    Pain Onset  1 to 4 weeks ago                       [facility] Adult PT Treatment/Exercise - 02/15/18 1300      Manual Therapy   Manual Therapy  Soft tissue mobilization;Myofascial release    Soft tissue mobilization  R upper trapezius    Myofascial Release  R upper   trapezius      Neck Exercises: Stretches   Upper Trapezius Stretch  Right;Left;20 seconds    Levator Stretch  Right;Left;1 rep;20 seconds    Other Neck Stretches  Single side and then bilat thoracic flexion<>extension with horizontal ADD > ABD (hand resting on the  back of her neck) x 5 reps each side and 5 reps bilateral with breathing.  Performed in order to increase anterior chest expansion/ROM and increase extension through upper thoracic spine       Trigger Point Dry Needling - 02/15/18 0859    Consent Given?  Yes    Muscles Treated Upper Body  Upper trapezius right and left   Upper Trapezius Response  Twitch reponse elicited in left only          PT Education - 02/15/18 1307    Education provided  Yes    Education Details  continued to review upper trapezius and levator stretches; discussed mm to focus on next session    Person(s) Educated  Patient    Methods  Explanation;Demonstration    Comprehension  Verbalized understanding;Returned demonstration       PT Short Term  Goals - 02/01/18 1151      PT SHORT TERM GOAL #1   Title  Pt will participate in full vestibular assessment and will initiate vestibular HEP    Time  4    Period  Weeks    Status  Achieved      PT SHORT TERM GOAL #2   Title  Pt will participate in further balance assessment (gait velocity, FGA) and will initiate balance and strengthening HEP    Time  4    Period  Weeks    Status  Achieved      PT SHORT TERM GOAL #3   Title  Pt will demonstrate independence with initial cervical ROM and postural exercises for HEP    Time  4    Period  Weeks    Status  Partially Met      PT SHORT TERM GOAL #4   Title  Pt will improve cervical ROM by 5-8 degrees and report </= 4/10 pain with movement    Time  4    Period  Weeks    Status  Partially Met      PT SHORT TERM GOAL #5   Title  Pt will improve FGA score to >/=15/30 to decr. falls risk.     Status  Achieved        PT Long Term Goals - 02/08/18 1321      PT LONG TERM GOAL #1   Title  Pt will be independent with vestibular, balance, neck/posture HEP  (ALL LTG DUE BY 03/03/18)    Time  8    Period  Weeks    Status  New      PT LONG TERM GOAL #2   Title  Pt will demonstrate 10-12 deg increase in cervical spine ROM and </= 2/10 pain with movement    Time  8    Period  Weeks    Status  New      PT LONG TERM GOAL #3   Title  Pt will improve gait velocity to >/= 3.0 ft/sec with no evidence of imbalance or needing to touch walls/furniture    Baseline  TBD    Time  8    Period  Weeks    Status  New      PT LONG TERM GOAL #4  Title  Pt will improve FGA by 8 points to decrease falls risk during gait    Baseline  11/30 on 01/18/18    Time  8    Period  Weeks    Status  Revised      PT LONG TERM GOAL #5   Title  Vestibular goal if indicated            Plan - 02/15/18 1308    Clinical Impression Statement  Continued to address neck pain and decreased ROM with TDN to bilat upper trapezius.  No twitch response noted on L  side and no significant mm resistance felt but significant twitch response noted on L side.  Following treatment, active trigger points still noted on L side - will address next session.  Continued to review stretches and exercises to maintain ROM and improve posture.  Pt demonstrated improved tolerance today with no sympathetic symptoms or dizziness after treatment.    Rehab Potential  Good    PT Frequency  2x / week    PT Duration  8 weeks    PT Treatment/Interventions  ADLs/Self Care Home Management;Cryotherapy;Lobbyist;Therapeutic exercise;Therapeutic activities;Functional mobility training;Patient/family education;Manual techniques;Canalith Repostioning;Moist Heat;Gait training;Neuromuscular re-education;Passive range of motion;Dry needling;Vestibular    PT Next Visit Plan  10th visit PN.  Continue dry needling and stretching of L upper trap/rhomboids, postural exercises.  Vestibular exercises.   High level balance to encourage incr. vestibular input.     PT Home Exercise Plan  [id number]    Consulted and Agree with Plan of Care  Patient       Patient will benefit from skilled therapeutic intervention in order to improve the following deficits and impairments:  Decreased range of motion, Difficulty walking, Increased muscle spasms, Pain, Decreased balance, Impaired flexibility, Postural dysfunction, Decreased strength, Decreased mobility, Decreased cognition, Dizziness  Visit Diagnosis: Cervicalgia  Dizziness and giddiness  Muscle weakness (generalized)     Problem List Patient Active Problem List   Diagnosis Date Noted  . Family history of genetic disease carrier   . Family history of breast cancer   . Family history of colon cancer   . Head injury with loss of consciousness ([place]) 11/20/2017  . Nausea 11/20/2017  . 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 Tremor  observed on examination 11/20/2017  . Left shoulder pain 02/18/2017  . MDD (major depressive disorder), recurrent severe, without psychosis ([place]) 12/28/2016  . Muscle strain 09/19/2016  . Right foot injury, subsequent encounter 08/25/2016  . Low back pain 06/27/2016  . Left wrist injury, subsequent encounter 06/27/2016  . Pain of left thumb 10/07/2015  . 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 Strain of left thumb 02/11/2015  . Strain of right forearm 02/11/2015  . Right shoulder pain 12/31/2014  . Injury of right little finger 12/31/2014  . Episodic cluster headache, 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  . 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 Hypersomnia, recurrent 12/02/2014  . Migraine aura, persistent, intractable, with status migrainosus 12/02/2014  . Lower back injury 09/30/2014  . Bipolar I disorder, most recent episode depressed ([place])   . MDD (major depressive disorder), recurrent, severe, with psychosis ([place]) 09/23/2014  . Injury of left index finger 08/20/2014  . Right ankle sprain 06/01/2014  . Contusion, multiple sites 06/01/2014  . Strain of right gastrocnemius muscle 06/01/2014  . Phonophobia 05/04/2014  . Photophobia of both eyes 05/04/2014  .  Emotionally unstable borderline personality disorder (HCC) 05/04/2014  . Nausea with vomiting 05/04/2014  . Mixed bipolar I disorder (HCC)   . Bipolar I disorder, most recent episode mixed (HCC) 04/18/2014  . Bipolar affective disorder, depressed, mild (HCC) 04/12/2014  . Suicidal ideation 04/12/2014  . Injury of right shoulder and upper arm 02/17/2014  . Left leg pain 06/03/2013  . Right hip pain 06/03/2013  . Migraine with status migrainosus 01/08/2013  . Personality disorder (HCC)   . Chronic migraine 05/08/2012  . Contact dermatitis 11/27/2011  . Major depressive disorder, recurrent episode (HCC) 10/27/2011  . Generalized anxiety disorder 10/27/2011  .  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 Borderline personality disorder (HCC) 10/27/2011  . Right foot pain 09/28/2011  . 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  . Ganglion cyst 09/29/2009  . PES PLANUS 07/01/2008  . BIPOLAR DISORDER UNSPECIFIED 06/09/2008     F , PT, DPT 02/15/18    1:15 PM    [facility] [address] [place], [place], [address] Phone: [phone]   Fax:  [phone]  Name: Emalene M Feggins MRN: [id number] Date of Birth: 02/12/1970   

## 2018-02-18 ENCOUNTER — Telehealth: Payer: Self-pay | Admitting: Genetics

## 2018-02-20 DIAGNOSIS — J40 Bronchitis, not specified as acute or chronic: Secondary | ICD-10-CM | POA: Diagnosis not present

## 2018-02-21 ENCOUNTER — Ambulatory Visit: Payer: PPO

## 2018-02-22 ENCOUNTER — Ambulatory Visit: Payer: PPO

## 2018-02-22 ENCOUNTER — Ambulatory Visit: Payer: PPO | Admitting: Physical Therapy

## 2018-02-25 ENCOUNTER — Other Ambulatory Visit: Payer: Self-pay | Admitting: Neurology

## 2018-02-25 ENCOUNTER — Ambulatory Visit: Payer: Self-pay | Admitting: Physical Therapy

## 2018-02-25 MED ORDER — DIVALPROEX SODIUM ER 500 MG PO TB24: ORAL_TABLET | ORAL | 5 refills | Status: DC

## 2018-02-26 ENCOUNTER — Encounter: Payer: Self-pay | Admitting: Speech Pathology

## 2018-02-26 ENCOUNTER — Ambulatory Visit: Payer: PPO

## 2018-02-26 ENCOUNTER — Ambulatory Visit: Payer: PPO | Admitting: Speech Pathology

## 2018-02-26 DIAGNOSIS — R2681 Unsteadiness on feet: Secondary | ICD-10-CM

## 2018-02-26 DIAGNOSIS — M542 Cervicalgia: Secondary | ICD-10-CM

## 2018-02-26 DIAGNOSIS — R41841 Cognitive communication deficit: Secondary | ICD-10-CM

## 2018-02-26 DIAGNOSIS — R42 Dizziness and giddiness: Secondary | ICD-10-CM

## 2018-02-26 DIAGNOSIS — R4701 Aphasia: Secondary | ICD-10-CM

## 2018-02-26 NOTE — Patient Instructions (Signed)
   Re-read your notes from [place]  Set alarm to remind you to take meds  Clock to alarm, tap + put in time  and hit save or    Get a S-[id number]-F-[id number] with 3x a day (am, noon, pm)  Get a binder for ST/PT exercises and notes from [place]  [employer]  Use exercise log

## 2018-02-26 NOTE — Therapy (Signed)
[facility] [address] [place], [place], [address] Phone: [phone]   Fax:  [phone]  Physical Therapy Treatment  Patient Details  Name: [name] MRN: [id number] Date of Birth: October 28, 1970 Referring Provider (PT): Larey Seat, MD   Encounter Date: 02/26/2018  PT End of Session - 02/26/18 0927    Visit Number  10    Number of Visits  17    Date for PT Re-Evaluation  03/03/18    Authorization Type  $15 copay; [employer].  VL: follow Medicare guidelines.  10th visit PN    PT Start Time  [phone]   pt late   PT Stop Time  0924   speech next   PT Time Calculation (min)  31 min       Past Medical History:  Diagnosis Date  . Achilles tendinitis   . Achilles tendinitis   . ADHD (attention deficit hyperactivity disorder)   . Allergy   . Arthritis   . Bipolar affective ([place])   . Bipolar disorder ([place])   . Cataracts, bilateral   . Eczema   . Family history of breast cancer   . Family history of colon cancer   . Family history of genetic disease carrier   . Ganglion cyst 09/29/2009   left wrist (2 cyst)  . Hyperprolactinemia ([place])   . Hypertension   . Lipoma   . Migraine   . Personality disorder ([place])   . Pes planus     Past Surgical History:  Procedure Laterality Date  . ANKLE SURGERY  12/88   left   . chest nodule  1990?   rt chest wall nodule removal  . GANGLION CYST EXCISION  2011  . lipoma removal    . right bunioectomy    . SHOULDER SURGERY  01/13/2011   right, partial tear  . tumor resection left thigh      There were no vitals filed for this visit.  Subjective Assessment - 02/26/18 0854    Subjective  Pt reported she's been sick with a severe cold and bronchitis, finished antibiotics and feels better. Pt denied falls since last visit.     Pertinent History  OT for shoulder pain, PT at [facility] for hip; multiple falls, achilles tendonitis, ADHD, OA, bipolar disorder,  cataracts bilaterally, eczema, HTN, lipoma with removal, migraine, personality disorder, suicide ideation, L ankle surgery, R bunionectomy, R shoulder surgery and L thigh tumor resection    Diagnostic tests  CT was negative for acute changes, neurology has ordered and MRI - not performed yet    Patient Stated Goals  To be more steady and to improve neck ROM    Currently in Pain?  No/denies         [facility] PT Assessment - 02/26/18 0908      AROM   Cervical Flexion  61   no pain   Cervical Extension  31   no pain   Cervical - Right Side Bend  33   no pain   Cervical - Left Side Bend  35   no pain   Cervical - Right Rotation  66   no pain   Cervical - Left Rotation  60   slight soreness per pt (3-4/10)     Functional Gait  Assessment   Gait assessed   Yes    Gait Level Surface  Walks 20 ft in less than 7 sec but greater than 5.5 sec, uses assistive device, slower speed,  mild gait deviations, or deviates 6-10 in outside of the 12 in walkway width.   6.61 sec.    Change in Gait Speed  Able to change speed, demonstrates mild gait deviations, deviates 6-10 in outside of the 12 in walkway width, or no gait deviations, unable to achieve a major change in velocity, or uses a change in velocity, or uses an assistive device.    Gait with Horizontal Head Turns  Performs head turns smoothly with slight change in gait velocity (eg, minor disruption to smooth gait path), deviates 6-10 in outside 12 in walkway width, or uses an assistive device.   pt reported unsteadiness   Gait with Vertical Head Turns  Performs head turns with no change in gait. Deviates no more than 6 in outside 12 in walkway width.    Gait and Pivot Turn  Pivot turns safely within 3 sec and stops quickly with no loss of balance.    Step Over Obstacle  Is able to step over one shoe box (4.5 in total height) without changing gait speed. No evidence of imbalance.    Gait with Narrow Base of Support  Ambulates 4-7 steps.   pt fearful  during this test   Gait with Eyes Closed  Walks 20 ft, uses assistive device, slower speed, mild gait deviations, deviates 6-10 in outside 12 in walkway width. Ambulates 20 ft in less than 9 sec but greater than 7 sec.   8.76 sec.    Ambulating Backwards  Walks 20 ft, uses assistive device, slower speed, mild gait deviations, deviates 6-10 in outside 12 in walkway width.    Steps  Alternating feet, must use rail.   no handrails to ascend, intermittent handrail to descend   Total Score  21    FGA comment:  21/30: indicates pt is at a medium falls risk.                    [facility] Adult PT Treatment/Exercise - 02/26/18 0908      Ambulation/Gait   Ambulation/Gait  Yes    Ambulation/Gait Assistance  7: Independent    Ambulation/Gait Assistance Details  IND over even terrain, no dynamic activities.    Ambulation Distance (Feet)  200 Feet    Assistive device  None    Gait Pattern  Step-through pattern;Decreased stride length;Decreased stance time - right    Ambulation Surface  Level;Indoor    Gait velocity  3.2 ft/sec. and 3.71f/sec. no AD             PT Education - 02/26/18 0926    Education provided  Yes    Education Details  PT discussed goal progress and likley renewal for 2x/week for 4 weeks. Pt agreeable. PT discussed outcome measure results and goal progress.     Person(s) Educated  Patient    Methods  Explanation    Comprehension  Verbalized understanding       PT Short Term Goals - 02/01/18 1151      PT SHORT TERM GOAL #1   Title  Pt will participate in full vestibular assessment and will initiate vestibular HEP    Time  4    Period  Weeks    Status  Achieved      PT SHORT TERM GOAL #2   Title  Pt will participate in further balance assessment (gait velocity, FGA) and will initiate balance and strengthening HEP    Time  4    Period  Weeks  Status  Achieved      PT SHORT TERM GOAL #3   Title  Pt will demonstrate independence with initial cervical ROM  and postural exercises for HEP    Time  4    Period  Weeks    Status  Partially Met      PT SHORT TERM GOAL #4   Title  Pt will improve cervical ROM by 5-8 degrees and report </= 4/10 pain with movement    Time  4    Period  Weeks    Status  Partially Met      PT SHORT TERM GOAL #5   Title  Pt will improve FGA score to >/=15/30 to decr. falls risk.     Status  Achieved        PT Long Term Goals - 02/26/18 0930      PT LONG TERM GOAL #1   Title  Pt will be independent with vestibular, balance, neck/posture HEP  (ALL LTG DUE BY 03/03/18)    Time  8    Period  Weeks    Status  New      PT LONG TERM GOAL #2   Title  Pt will demonstrate 10-12 deg increase in cervical spine ROM and </= 2/10 pain with movement    Time  8    Period  Weeks    Status  Partially Met      PT LONG TERM GOAL #3   Title  Pt will improve gait velocity to >/= 3.0 ft/sec with no evidence of imbalance or needing to touch walls/furniture    Time  8    Period  Weeks    Status  Achieved      PT LONG TERM GOAL #4   Title  Pt will improve FGA by 8 points to decrease falls risk during gait    Baseline  21/30 on 02/26/18    Time  8    Period  Weeks    Status  Achieved      PT LONG TERM GOAL #5   Title  Vestibular goal if indicated            Plan - 02/26/18 9201    Clinical Impression Statement  Pt demonstrated progress, as she met LTGs 3 and 4 and partially met LTG 2. PT will assess LTG (HEP) next session. Pt's FGA score indicates pt is at a medium risk for falls. PT will likely request renewal for 2x/week for 4 weeks after assessing LTG 1 next session. Continue with POC.     Rehab Potential  Good    PT Frequency  2x / week    PT Duration  8 weeks    PT Treatment/Interventions  ADLs/Self Care Home Management;Cryotherapy;Lobbyist;Therapeutic exercise;Therapeutic activities;Functional mobility training;Patient/family education;Manual techniques;Canalith Repostioning;Moist  Heat;Gait training;Neuromuscular re-education;Passive range of motion;Dry needling;Vestibular    PT Next Visit Plan  Assess LTG 1 (HEP and likley renew). Continue dry needling and stretching of L upper trap/rhomboids, postural exercises.  Vestibular exercises.   High level balance to encourage incr. vestibular input.     PT Home Exercise Plan  [id number]    Consulted and Agree with Plan of Care  Patient       Patient will benefit from skilled therapeutic intervention in order to improve the following deficits and impairments:  Decreased range of motion, Difficulty walking, Increased muscle spasms, Pain, Decreased balance, Impaired flexibility, Postural dysfunction, Decreased strength, Decreased mobility, Decreased cognition, Dizziness  Visit Diagnosis: Unsteadiness  on feet  Dizziness and giddiness  Cervicalgia     Problem List Patient Active Problem List   Diagnosis Date Noted  . Family history of genetic disease carrier   . Family history of breast cancer   . Family history of colon cancer   . Head injury with loss of consciousness ([place]) 11/20/2017  . Nausea 11/20/2017  . 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 Tremor observed on examination 11/20/2017  . Left shoulder pain 02/18/2017  . MDD (major depressive disorder), recurrent severe, without psychosis ([place]) 12/28/2016  . Muscle strain 09/19/2016  . Right foot injury, subsequent encounter 08/25/2016  . Low back pain 06/27/2016  . Left wrist injury, subsequent encounter 06/27/2016  . Pain of left thumb 10/07/2015  . 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 Strain of left thumb 02/11/2015  . Strain of right forearm 02/11/2015  . Right shoulder pain 12/31/2014  . Injury of right little finger 12/31/2014  . Episodic cluster headache, 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  . 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 Parasomnia  overlap disorder 12/02/2014  . Hypersomnia, recurrent 12/02/2014  . Migraine aura, persistent, intractable, with status migrainosus 12/02/2014  . Lower back injury 09/30/2014  . Bipolar I disorder, most recent episode depressed ([place])   . MDD (major depressive disorder), recurrent, severe, with psychosis ([place]) 09/23/2014  . Injury of left index finger 08/20/2014  . Right ankle sprain 06/01/2014  . Contusion, multiple sites 06/01/2014  . Strain of right gastrocnemius muscle 06/01/2014  . Phonophobia 05/04/2014  . Photophobia of both eyes 05/04/2014  . Emotionally unstable borderline personality disorder ([place]) 05/04/2014  . Nausea with vomiting 05/04/2014  . Mixed bipolar I disorder ([place])   . Bipolar I disorder, most recent episode mixed ([place]) 04/18/2014  . Bipolar affective disorder, depressed, mild ([place]) 04/12/2014  . Suicidal ideation 04/12/2014  . Injury of right shoulder and upper arm 02/17/2014  . Left leg pain 06/03/2013  . Right hip pain 06/03/2013  . Migraine with status migrainosus 01/08/2013  . Personality disorder ([place])   . Chronic migraine 05/08/2012  . Contact dermatitis 11/27/2011  . Major depressive disorder, recurrent episode ([place]) 10/27/2011  . Generalized anxiety disorder 10/27/2011  . 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 Borderline personality disorder ([place]) 10/27/2011  . Right foot pain 09/28/2011  . 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 Malignant tumor of muscle ([place]) 09/02/2010  . Ganglion cyst 09/29/2009  . PES PLANUS 07/01/2008  . BIPOLAR DISORDER UNSPECIFIED 06/09/2008    Miller,Jennifer L 02/26/2018, 9:31 AM  [facility] [address] [place], [place], [address] Phone: [phone]   Fax:  [phone]  Name: [name] MRN: [id number] Date of Birth: January 20, 1971  Progress Note Reporting Period 01/02/18  to 02/26/18  See note below  for Objective Data and Assessment of Progress/Goals.    Geoffry Paradise, PT,DPT 02/26/18 9:32 AM Phone: [phone] Fax: [phone]

## 2018-02-26 NOTE — Therapy (Signed)
[facility] [address] [place], [place], [address] Phone: [phone]   Fax:  [phone]  Speech Language Pathology Treatment  Patient Details  Name: [name] MRN: [id number] Date of Birth: [birth year] Referring Provider (SLP): Seymour Bars, NP   Encounter Date: 02/26/2018  End of Session - 02/26/18 1200    Visit Number  7    Number of Visits  17    Date for SLP Re-Evaluation  04/19/18    SLP Start Time  0927    SLP Stop Time   1016    SLP Time Calculation (min)  49 min    Activity Tolerance  Patient tolerated treatment well       Past Medical History:  Diagnosis Date  . Achilles tendinitis   . Achilles tendinitis   . ADHD (attention deficit hyperactivity disorder)   . Allergy   . Arthritis   . Bipolar affective ([place])   . Bipolar disorder ([place])   . Cataracts, bilateral   . Eczema   . Family history of breast cancer   . Family history of colon cancer   . Family history of genetic disease carrier   . Ganglion cyst 09/29/2009   left wrist (2 cyst)  . Hyperprolactinemia ([place])   . Hypertension   . Lipoma   . Migraine   . Personality disorder ([place])   . Pes planus     Past Surgical History:  Procedure Laterality Date  . ANKLE SURGERY  12/88   left   . chest nodule  1990?   rt chest wall nodule removal  . GANGLION CYST EXCISION  2011  . lipoma removal    . right bunioectomy    . SHOULDER SURGERY  01/13/2011   right, partial tear  . tumor resection left thigh      There were no vitals filed for this visit.  Subjective Assessment - 02/26/18 0934    Subjective  "I had it right next to my purse on yellow paper and I forgot it"            ADULT SLP TREATMENT - 02/26/18 0935      General Information   Behavior/Cognition  Alert;Cooperative      Pain Assessment   Pain Assessment  No/denies pain      Cognitive-Linquistic Treatment   Treatment focused on  Cognition    Skilled  Treatment  Pt verbalized her plan for her vacation with details (her HW) with  occasional min questioning cues re: details of time/schedule and money. Pt reports inconsistently  completing PT exercises due to forgetting or not finding her PT handout. We generated a PT HEP log for [name] to check off each exercise she completes daily. [name] also reports forgetting her meds, even this am. Instructed her to get a am/noon/pm med Environmental education officer. She did not set alarms on her phone as she couldn't find the instructions from prior ST session. Pt instructed to get a memory binder to keep all of her HEP and therapy notes. I will plan on adding calendar/appointments next session, as well as section for notes as pt reports forgetting daily information.      Assessment / Recommendations / Plan   Plan  Continue with current plan of care      Progression Toward Goals   Progression toward goals  Progressing toward goals       SLP Education - 02/26/18 1152    Education Details  alarm for meds and med Environmental education officer;  PT HEP log; memory binder    Person(s) Educated  Patient    Methods  Explanation    Comprehension  Verbalized understanding       SLP Short Term Goals - 02/26/18 1158      SLP SHORT TERM GOAL #1   Title  pt will complete cognitive linguistic testing in the first 2 therapy sessions    Status  Achieved      SLP SHORT TERM GOAL #2   Title  pt will name average 14 items in common categories in 60 seconds    Time  1    Period  Weeks    Status  On-going      SLP SHORT TERM GOAL #3   Title  pt will successfully use at least 2 compensatory strategies for anomia multiple times in 10 mintues of conversation over two sessions    Baseline  02-05-18    Time  1    Status  On-going      SLP SHORT TERM GOAL #4   Title  pt will demo organization/executive function skills adequate for simple functional tasks with rare min A    Time  1    Period  Weeks    Status  New      SLP SHORT TERM GOAL #5   Title  pt will  report 2 functional memory compensations she uses between 3 sessions    Baseline  02-11-18, 02-15-18,    Time  1    Period  Weeks    Status  On-going      SLP SHORT TERM GOAL #6   Title  to improve pt's attention skills, pt will use compensations for attention in 2 sessions    Baseline  02-11-18    Time  2    Period  Weeks    Status  New       SLP Long Term Goals - 02/26/18 1158      SLP LONG TERM GOAL #1   Title  pt will improve her Boston Naming Test score to at least 51/60 on her last day of therapy    Time  5    Period  Weeks   17 sessions, for all LTGs   Status  On-going      SLP LONG TERM GOAL #2   Title  pt will use anomia compensations successfully in 15 minutes mod complex conversation over 3 therapy sessions    Baseline  02-15-18    Time  5    Period  Weeks    Status  Revised      SLP LONG TERM GOAL #3   Title  pt will report successful usage of memory compensations between 3 sessions    Time  5    Period  Weeks    Status  New      SLP LONG TERM GOAL #4   Title  pt will perform executive function pertinent for everyday cognitive-linguistic tasks with modified independence in 3 sessions    Time  5    Period  Weeks    Status  New      SLP LONG TERM GOAL #5   Title  pt will demo functional divided attention skills with modified independence within 3 ST sessions    Time  5    Period  Weeks    Status  New       Plan - 02/26/18 1159    Clinical Impression Statement  Pt continues to exhibit deficits  in memory, attention, organization as evidenced by her forgetting her [place], not f/u with ST recs and loosing her ST notes. Pt also reports continued forgetting to take meds. Anomia evident today in higher level discourse with low frequency words re: planning vacation to [place]. Continue skilled ST to maximize congition and word finding for success in higher level ADL's, safey and QOL.        Patient will benefit from skilled therapeutic intervention in order to improve  the following deficits and impairments:   Cognitive communication deficit  Aphasia    Problem List Patient Active Problem List   Diagnosis Date Noted  . Family history of genetic disease carrier   . Family history of breast cancer   . Family history of colon cancer   . Head injury with loss of consciousness ([place]) 11/20/2017  . Nausea 11/20/2017  . 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 Tremor observed on examination 11/20/2017  . Left shoulder pain 02/18/2017  . MDD (major depressive disorder), recurrent severe, without psychosis ([place]) 12/28/2016  . Muscle strain 09/19/2016  . Right foot injury, subsequent encounter 08/25/2016  . Low back pain 06/27/2016  . Left wrist injury, subsequent encounter 06/27/2016  . Pain of left thumb 10/07/2015  . 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 Strain of left thumb 02/11/2015  . Strain of right forearm 02/11/2015  . Right shoulder pain 12/31/2014  . Injury of right little finger 12/31/2014  . Episodic cluster headache, 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  . 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 Hypersomnia, recurrent 12/02/2014  . Migraine aura, persistent, intractable, with status migrainosus 12/02/2014  . Lower back injury 09/30/2014  . Bipolar I disorder, most recent episode depressed ([place])   . MDD (major depressive disorder), recurrent, severe, with psychosis ([place]) 09/23/2014  . Injury of left index finger 08/20/2014  . Right ankle sprain 06/01/2014  . Contusion, multiple sites 06/01/2014  . Strain of right gastrocnemius muscle 06/01/2014  . Phonophobia 05/04/2014  . Photophobia of both eyes 05/04/2014  . Emotionally unstable borderline personality disorder ([place]) 05/04/2014  . Nausea with vomiting 05/04/2014  . Mixed bipolar I disorder ([place])   . Bipolar I disorder, most recent  episode mixed ([place]) 04/18/2014  . Bipolar affective disorder, depressed, mild ([place]) 04/12/2014  . Suicidal ideation 04/12/2014  . Injury of right shoulder and upper arm 02/17/2014  . Left leg pain 06/03/2013  . Right hip pain 06/03/2013  . Migraine with status migrainosus 01/08/2013  . Personality disorder ([place])   . Chronic migraine 05/08/2012  . Contact dermatitis 11/27/2011  . Major depressive disorder, recurrent episode ([place]) 10/27/2011  . Generalized anxiety disorder 10/27/2011  . 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 Borderline personality disorder ([place]) 10/27/2011  . Right foot pain 09/28/2011  . 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 Ganglion cyst 09/29/2009  . PES PLANUS 07/01/2008  . BIPOLAR DISORDER UNSPECIFIED 06/09/2008    Yaniris Braddock, Annye Rusk MS, CCC-SLP 02/26/2018, 12:02 PM  [facility] [address] [place], [place], [address] Phone: [phone]   Fax:  [phone]   Name: AYAAT JANSMA MRN: [id number] Date of Birth: 05/11/1970

## 2018-02-27 ENCOUNTER — Ambulatory Visit: Payer: Self-pay | Admitting: Genetics

## 2018-02-27 DIAGNOSIS — Z1502 Genetic susceptibility to malignant neoplasm of ovary: Secondary | ICD-10-CM | POA: Insufficient documentation

## 2018-02-27 DIAGNOSIS — Z1509 Genetic susceptibility to other malignant neoplasm: Secondary | ICD-10-CM

## 2018-02-27 DIAGNOSIS — Z803 Family history of malignant neoplasm of breast: Secondary | ICD-10-CM

## 2018-02-27 DIAGNOSIS — Z1589 Genetic susceptibility to other disease: Secondary | ICD-10-CM

## 2018-02-27 DIAGNOSIS — Z1379 Encounter for other screening for genetic and chromosomal anomalies: Secondary | ICD-10-CM

## 2018-02-27 DIAGNOSIS — Z800 Family history of malignant neoplasm of digestive organs: Secondary | ICD-10-CM

## 2018-02-27 DIAGNOSIS — Z1501 Genetic susceptibility to malignant neoplasm of breast: Secondary | ICD-10-CM

## 2018-02-27 DIAGNOSIS — Z8481 Family history of carrier of genetic disease: Secondary | ICD-10-CM

## 2018-02-27 HISTORY — DX: Genetic susceptibility to malignant neoplasm of breast: Z15.02

## 2018-02-27 HISTORY — DX: Genetic susceptibility to malignant neoplasm of breast: Z15.01

## 2018-02-27 HISTORY — DX: Genetic susceptibility to other malignant neoplasm: Z15.09

## 2018-02-27 NOTE — Telephone Encounter (Signed)
Patient stopped by my office to receive results. See results note for discussion.

## 2018-02-27 NOTE — Progress Notes (Signed)
HPI:  Ms. 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   Please refer to our prior cancer genetics clinic note for more information regarding Ms. Granderson's medical, social and family histories, and our assessment and recommendations, at the time. Ms. Luft recent genetic test results were disclosed to her, as well as recommendations warranted by these results. These results and recommendations are discussed in more detail below.  Patient was called 1/20- no answer/busy Patient stopped into office on 02/27/2018 and I revealed her result to her.    FAMILY HISTORY:  We obtained a detailed, 4-generation family history.  Significant diagnoses are listed below: Family History  Problem Relation Age of Onset  . Hypertension Mother   . Hyperlipidemia Mother   . 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 Heart attack Father   . Heart disease Father   . Hypertension Father   . Bipolar disorder Father   . Diabetes Paternal Grandfather   . Heart disease Maternal Aunt   . Breast cancer Maternal Aunt 65  . Heart disease Maternal Grandmother   . Colon cancer Maternal Grandmother 66  . Colon cancer Maternal Uncle 75  . Lymphoma Maternal Grandfather 20  . Breast cancer Maternal Aunt 65  . Breast cancer Maternal Aunt 35  . Breast cancer Maternal Aunt   . Bone cancer Cousin 31  . Cervical cancer Cousin        Genetic testing- 'was positive for a gene' had a mastectomy    Ms. Mkrtchyan has no children.  She has a brother who is 9 with no hx of cancer.   Ms. Waltner's father: recently died, no other info is known Paternal Aunts/Uncles: 3-4 paternal aunts, no known cancer, limited info Paternal cousins: no known hx of cancer, limited info Paternal grandfather: had diabetes Paternal grandmother:no cancer, but a lot of other health  issues  Ms. Freiman's mother: dx with breast cancer in April 2019 at the age of 31, and now is being treated for endometrial cancer. She had genetic testing that originally revealed a variant of uncertain significance in the gene CHEK2 [phone] ([employer]).   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   Maternal Aunts/Uncles: 8 maternal aunts, 2 maternal uncles: -1 maternal aunt dx with breast cancer at 34, died at 57 -1 maternal aunt dx with breast cancer at 69 -1 maternal aunt dx with breast cancer at 41 -1 maternal aunt died in her 88's, had hx of breast cancer -1 maternal uncle had colon cancer in his 53's/80's -1 maternal uncle died with Alzheimer's disease. Maternal cousins: 1 maternal cousin had cervical/female cancer and tested positive for a 'gene' she had a mastectomy. Another cousin died of bone cancer in her 54's.   Maternal grandfather: died at 50 due to lymphoma dx at 37 Maternal grandmother: died at 23, colon cancer dx at 54.    Patient's maternal ancestors are of German/Irish/Engls[place] descent, and paternal ancestors are of N. European descent. There is no reported Ashkenazi Jewish ancestry. There is no known consanguinity.  GENETIC TEST RESULTS: Genetic testing was performed through [facility]'s Multi-Caner Panel reported out on 02/16/2018.   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  No other mutations were identified in the other 59  genes analyzed.   The Multi-Cancer Panel offered by [facility] includes sequencing and/or deletion duplication testing of the following 90 genes: AIP, ALK, APC, ATM, AXIN2, BAP1, BARD1, BLM, BMPR1A, BRCA1, BRCA2, BRIP1, BUB1B, CASR, CDC73, CDH1, CDK4, CDKN1B, CDKN1C, CDKN2A, CEBPA, CHEK2, CTNNA1, DICER1, DIS3L2, EGFR, ENG, EPCAM, FH, FLCN, GALNT12, GATA2, GPC3, GREM1, HOXB13, HRAS, KIT, MAX, MEN1, MET, MITF, MLH1, MLH3, MSH2, MSH3, MSH6, MUTYH, NBN,  NF1, NF2, NTHL1, PALB2, PDGFRA, PHOX2B, PMS2, POLD1, POLE, POT1, PRKAR1A, PTCH1, PTEN, RAD50, RAD51C, RAD51D, RB1, RECQL4, RET, RNF43, RPS20, RUNX1, SDHA, SDHAF2, SDHB, SDHC, SDHD, SMAD4, SMARCA4, SMARCB1, SMARCE1, STK11, SUFU, TERC, TERT, TMEM127, TP53, TSC1, TSC2, VHL, WRN, WT1  The test report will be scanned into EPIC and will be located under the Molecular Pathology section of the Results Review tab. A portion of the result report is included below for reference.     We discussed with Ms. Slauson that because current genetic testing is not perfect, it is possible there may be a gene mutation in one of these genes that current testing cannot detect, but that chance is small.  We also discussed, that there could be another gene that has not yet been discovered, or that we have not yet tested, that is responsible for the cancer diagnoses in the family. It is also possible there is a hereditary cause for the cancer in the family that Ms. Thorstenson did not inherit and therefore was not identified in her testing.  Therefore, it is important to remain in touch with cancer genetics in the future so that we can continue to offer Ms. Carsten the most up to date genetic testing.   CHEK2 CHEK2 mutationsareassociated with an increased risk of breast, colon,and other cancers. The estimated cancer risks vary widely and may be influenced by family history.  Breast Cancer: Women with a CHEK2 deleterious mutation have approximately a 24% (no family history of breast cancer) to 48% (strong family history of breast cancer) lifetime risk of breast cancer,and up to a 25% risk fora second breast cancer. Men may have asomewhatincreased risk for female breast cancer.This risk is not nearly as high for men as it is for women, but is not well defined.  According to the NCCN guidelines 1.2020: Womenwith CHEK2 mutations should have an annual mammogram and also consider an annual breast MRI with contrast starting at  age 60. (or 44 years younger than the earliest ageat breast cancer diagnosis in the family.) (typically alternating imaging types every 6 months).  Some women may consider antiestrogen therapy as a method for reducing breast cancer risk as well.  If she is interested she can speak with an oncologist regarding this therapy.  Ms. Runion had a mammogram in April 2019 so she would be due for additional breast screening at this time if following the high risk breast screening protocol described above.   Colon Cancer: Men and womenwith CHEK2 mutationshave an increased risk of colon cancer (~10% lifetime risk).  Colonoscopy screening every 5 years beginning at age 60(ormore oftenbased on polyp findings/ GI physician recommendations).  If an individual has a first-degree relative with colorectal cancer, screening should begin 10 years prior to the relative's age at diagnosis if before 52.   If an individual has a personal history of colorectal cancer, screening recommendations should be based on recommendations for post-colorectal cancer resection.   Prostate Cancer: The risk of prostate cancer in men with a CHEK2 mutation is estimated to be increased up to 30%. However, the benefits of screening for  prostate cancer among men with a pathogenic variant in CHEK2 are uncertain (PMID: 57505183).They may discuss the option of incraesed prostate cancer screening with their providers.  Other Cancer Risks: It has been suggestedCHEK2 mutationsmay beassociated with other malignancies such asovarian, uterine,thyroid, melanoma, bladder, or kidney. However, more data is still needed to confirm these associations.   RECOMMENDATIONS FOR FAMILY MEMBERS:  We recommend all of Ms. Pohlman' relatives also have genetic testing for the familial CHEK2 mutation and the family history of cancer.  Her brother has a 50% chance to also have inherited this mutation.  All family members should inform their  physicians about the family history of cancer so their doctors can make the most appropriate screening recommendations for them.   PLAN: 1. Ms. Scrima was offered an appointment in high risk clinic to meet with an oncologist who can follow her and establish a screening plan for her breast and colon cancer screening.  At this time she declined this appointment.  She is interested in seeing Dr. Jana Hakim in the future, but has a lot going on medically right now she needs to get through first.  She states she will reach out to our scheduler, Seth Bake, or our genetics department in the coming months if she is interested in setting this up in the future.   2.  In the meantime, we will send these results to her primary care physician [name], MD for follow-up regarding this test result and appropriate screening.   3. Ms. Crosley will share this information with her family and will let us know if we can be of any assistance in coordinating testing for any relatives.    Lastly, we discussed with Ms. Beavers that cancer genetics is a rapidly advancing field and it is possible that new genetic tests will be appropriate for her and/or her family members in the future. We encouraged her to remain in contact with cancer genetics on an annual basis so we can update her personal and family histories and let her know of advances in cancer genetics that may benefit this family.   Our contact number was provided. Ms. Conteh questions were answered to her satisfaction, and she knows she is welcome to call us at anytime with additional questions or concerns.   Ferol Luz, MS, [facility] Certified Genetic Counselor [email or web address]

## 2018-02-28 ENCOUNTER — Ambulatory Visit: Payer: PPO | Admitting: Speech Pathology

## 2018-02-28 ENCOUNTER — Encounter: Payer: Self-pay | Admitting: Speech Pathology

## 2018-02-28 DIAGNOSIS — R4701 Aphasia: Secondary | ICD-10-CM

## 2018-02-28 DIAGNOSIS — R42 Dizziness and giddiness: Secondary | ICD-10-CM | POA: Diagnosis not present

## 2018-02-28 DIAGNOSIS — R41841 Cognitive communication deficit: Secondary | ICD-10-CM

## 2018-02-28 NOTE — Patient Instructions (Signed)
  Consider trying a mini notebook and writing down the jobs your boss is assigning you and check off when you are done with each  Repeat back the tasks she gives you   [place] job with the binder - put it in a place where you will remember to bring it to therapy  Put in a note section or get the mini note book to write down messages, to do lists etc  Work on a meal and shopping budget for your trip   In speech therapy we are working on word finding (language) and memory, attention, organization (cognition)  Clutter is bad for your brain - organization helps your brain  [employer] list according to sections (deli, produce, dairy, frozen, etc) and do a mental plan or mental picture before you go

## 2018-02-28 NOTE — Therapy (Signed)
[facility] [address] [place], [place], [address] Phone: [phone]   Fax:  [phone]  Speech Language Pathology Treatment  Patient Details  Name: JANAYAH ZAVADA MRN: [id number] Date of Birth: Oct 25, 1970 Referring Provider (SLP): Seymour Bars, NP   Encounter Date: 02/28/2018  End of Session - 02/28/18 1535    Visit Number  8    Number of Visits  17    Date for SLP Re-Evaluation  04/19/18    SLP Start Time  0844    SLP Stop Time   0925    SLP Time Calculation (min)  41 min    Activity Tolerance  Patient tolerated treatment well       Past Medical History:  Diagnosis Date  . Achilles tendinitis   . Achilles tendinitis   . ADHD (attention deficit hyperactivity disorder)   . Allergy   . Arthritis   . Bipolar affective ([place])   . Bipolar disorder ([place])   . Cataracts, bilateral   . Eczema   . Family history of breast cancer   . Family history of colon cancer   . Family history of genetic disease carrier   . Ganglion cyst 09/29/2009   left wrist (2 cyst)  . Hyperprolactinemia ([place])   . Hypertension   . Lipoma   . Migraine   . Personality disorder ([place])   . Pes planus     Past Surgical History:  Procedure Laterality Date  . ANKLE SURGERY  12/88   left   . chest nodule  1990?   rt chest wall nodule removal  . GANGLION CYST EXCISION  2011  . lipoma removal    . right bunioectomy    . SHOULDER SURGERY  01/13/2011   right, partial tear  . tumor resection left thigh      There were no vitals filed for this visit.  Subjective Assessment - 02/28/18 0902    Subjective  Pt arrived with notebook organized with dividers and detailed vacation plan and budget    Currently in Pain?  No/denies            ADULT SLP TREATMENT - 02/28/18 0908      General Information   Behavior/Cognition  Alert;Cooperative      Cognitive-Linquistic Treatment   Treatment focused on  Cognition    Skilled  Treatment  Pt returns with organized memory binder, she re-organized PT HEP log with more detail. Pt reports difficulty recalling the set of tasks her boss assigns her at work. We collaboratively generated a strategy of using a mini note book she can keep on her person to write down the list of tasks she is to complete at work. We also decided that [name] will then repeat the tasks back to her boss to make sure she has them all and they are correct. [name] reports difficulty grocery shopping due to attention, organization and memory, as she forgets where items are or passes by them. Strategy of making a list accordingn to sections (deli, produce, dairy, frozen etc) and making a mental picture/plan of where items are before starting to shop. [name] does report sensory over stimulation from mTBI. Educated her on compensations of taking sensory breaks in quiet dark spaces, as well as using ball cap or sunglasses in the grocery.       Assessment / Recommendations / Plan   Plan  Continue with current plan of care      Progression Toward Goals   Progression toward  goals  Progressing toward goals       SLP Education - 02/28/18 1536    Education Details  Mini note book for to do list at work; Repeat back instructions for supervisor    Person(s) Educated  Patient    Methods  Explanation;Handout    Comprehension  Verbalized understanding       SLP Short Term Goals - 02/28/18 1540      SLP SHORT TERM GOAL #1   Title  pt will complete cognitive linguistic testing in the first 2 therapy sessions    Status  Achieved      SLP [place] #2   Title  pt will name average 14 items in common categories in 60 seconds    Time  1    Period  Weeks    Status  On-going      SLP SHORT TERM GOAL #3   Title  pt will successfully use at least 2 compensatory strategies for anomia multiple times in 10 mintues of conversation over two sessions    Baseline  02-05-18    Time  1    Status  On-going      SLP [place] #4   Title  pt will demo organization/executive function skills adequate for simple functional tasks with rare min A    Time  1    Period  Weeks    Status  New      SLP SHORT TERM GOAL #5   Title  pt will report 2 functional memory compensations she uses between 3 sessions    Baseline  02-11-18, 02-15-18,    Time  1    Period  Weeks    Status  On-going      SLP SHORT TERM GOAL #6   Title  to improve pt's attention skills, pt will use compensations for attention in 2 sessions    Baseline  02-11-18    Time  2    Period  Weeks    Status  New       SLP Long Term Goals - 02/28/18 1541      SLP LONG TERM GOAL #1   Title  pt will improve her Boston Naming Test score to at least 51/60 on her last day of therapy    Time  5    Period  Weeks   17 sessions, for all LTGs   Status  On-going      SLP LONG TERM GOAL #2   Title  pt will use anomia compensations successfully in 15 minutes mod complex conversation over 3 therapy sessions    Baseline  02-15-18    Time  5    Period  Weeks    Status  Revised      SLP LONG TERM GOAL #3   Title  pt will report successful usage of memory compensations between 3 sessions    Time  5    Period  Weeks    Status  New      SLP LONG TERM GOAL #4   Title  pt will perform executive function pertinent for everyday cognitive-linguistic tasks with modified independence in 3 sessions    Time  5    Period  Weeks    Status  New      SLP LONG TERM GOAL #5   Title  pt will demo functional divided attention skills with modified independence within 3 ST sessions    Time  5  Period  Weeks    Status  On-going       Plan - 02/28/18 1537    Clinical Impression Statement  [name] continues to experience difficulty completing tasks at work and higher level ADL's, such as grocery shopping, due to memory, attention ,processing  and sensory hyperstimulation. Initiated Social worker, use of mini notepad to write down her to do list at work. Continue skilled ST  to maximize congition and word finding for high level ADL's, work , and QOL.     Speech Therapy Frequency  2x / week    Duration  --   8 weeks, 17 visits   Treatment/Interventions  Language facilitation;Environmental controls;Cueing hierarchy;SLP instruction and feedback;Compensatory strategies;Cognitive reorganization;Functional tasks;Internal/external aids;Multimodal communcation approach;Patient/family education    Potential to Achieve Goals  Good       Patient will benefit from skilled therapeutic intervention in order to improve the following deficits and impairments:   Cognitive communication deficit  Aphasia    Problem List Patient Active Problem List   Diagnosis Date Noted  . 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 Genetic testing 02/27/2018  . Family history of genetic disease carrier   . Family history of breast cancer   . Family history of colon cancer   . Head injury with loss of consciousness ([place]) 11/20/2017  . Nausea 11/20/2017  . 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 Tremor observed on examination 11/20/2017  . Left shoulder pain 02/18/2017  . MDD (major depressive disorder), recurrent severe, without psychosis ([place]) 12/28/2016  . Muscle strain 09/19/2016  . Right foot injury, subsequent encounter 08/25/2016  . Low back pain 06/27/2016  . Left wrist injury, subsequent encounter 06/27/2016  . Pain of left thumb 10/07/2015  . 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 Strain of left thumb 02/11/2015  . Strain of right forearm 02/11/2015  . Right shoulder pain 12/31/2014  . Injury of right little finger 12/31/2014  . Episodic cluster headache, 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  . 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 Hypersomnia, recurrent 12/02/2014  . Migraine aura, persistent,  intractable, with status migrainosus 12/02/2014  . Lower back injury 09/30/2014  . Bipolar I disorder, most recent episode depressed ([place])   . MDD (major depressive disorder), recurrent, severe, with psychosis ([place]) 09/23/2014  . Injury of left index finger 08/20/2014  . Right ankle sprain 06/01/2014  . Contusion, multiple sites 06/01/2014  . Strain of right gastrocnemius muscle 06/01/2014  . Phonophobia 05/04/2014  . Photophobia of both eyes 05/04/2014  . Emotionally unstable borderline personality disorder ([place]) 05/04/2014  . Nausea with vomiting 05/04/2014  . Mixed bipolar I disorder ([place])   . Bipolar I disorder, most recent episode mixed ([place]) 04/18/2014  . Bipolar affective disorder, depressed, mild ([place]) 04/12/2014  . Suicidal ideation 04/12/2014  . Injury of right shoulder and upper arm 02/17/2014  . Left leg pain 06/03/2013  . Right hip pain 06/03/2013  . Migraine with status migrainosus 01/08/2013  . Personality disorder ([place])   . Chronic migraine 05/08/2012  . Contact dermatitis 11/27/2011  . Major depressive disorder, recurrent episode ([place]) 10/27/2011  . Generalized anxiety disorder 10/27/2011  . 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 Borderline personality disorder ([place]) 10/27/2011  . Right foot pain 09/28/2011  . 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  Ganglion cyst 09/29/2009  . PES PLANUS 07/01/2008  . BIPOLAR DISORDER UNSPECIFIED 06/09/2008    Demoni Gergen, Annye Rusk MS, CCC-SLP 02/28/2018, 3:42 PM  [facility] [address] [place], [place], [address] Phone: [phone]   Fax:  [phone]   Name: LOVETTE MERTA MRN: [id number] Date of Birth: [birth year]

## 2018-03-01 ENCOUNTER — Ambulatory Visit: Payer: Self-pay | Admitting: Physical Therapy

## 2018-03-01 ENCOUNTER — Ambulatory Visit: Payer: PPO

## 2018-03-01 DIAGNOSIS — R2681 Unsteadiness on feet: Secondary | ICD-10-CM

## 2018-03-01 DIAGNOSIS — L308 Other specified dermatitis: Secondary | ICD-10-CM | POA: Diagnosis not present

## 2018-03-01 DIAGNOSIS — M542 Cervicalgia: Secondary | ICD-10-CM

## 2018-03-01 DIAGNOSIS — R296 Repeated falls: Secondary | ICD-10-CM

## 2018-03-01 DIAGNOSIS — R42 Dizziness and giddiness: Secondary | ICD-10-CM

## 2018-03-01 DIAGNOSIS — M6281 Muscle weakness (generalized): Secondary | ICD-10-CM

## 2018-03-01 NOTE — Therapy (Signed)
[facility] [address] [place], [place], [address] Phone: [phone]   Fax:  [phone]  Physical Therapy Treatment  Patient Details  Name: [name] MRN: [id number] Date of Birth: Aug 04, 1970 Referring Provider (PT): Larey Seat, MD   Encounter Date: 03/01/2018  PT End of Session - 03/01/18 1000    Visit Number  11    Number of Visits  17    Date for PT Re-Evaluation  04/01/18    Authorization Type  $15 copay; [employer].  VL: follow Medicare guidelines.  10th visit PN    PT Start Time  [phone]    PT Stop Time  0926    PT Time Calculation (min)  40 min    Equipment Utilized During Treatment  --   S prn   Activity Tolerance  Patient tolerated treatment well    Behavior During Therapy  WFL for tasks assessed/performed       Past Medical History:  Diagnosis Date  . Achilles tendinitis   . Achilles tendinitis   . ADHD (attention deficit hyperactivity disorder)   . Allergy   . Arthritis   . Bipolar affective ([place])   . Bipolar disorder ([place])   . Cataracts, bilateral   . Eczema   . Family history of breast cancer   . Family history of colon cancer   . Family history of genetic disease carrier   . Ganglion cyst 09/29/2009   left wrist (2 cyst)  . Hyperprolactinemia ([place])   . Hypertension   . Lipoma   . Migraine   . Personality disorder ([place])   . Pes planus     Past Surgical History:  Procedure Laterality Date  . ANKLE SURGERY  12/88   left   . chest nodule  1990?   rt chest wall nodule removal  . GANGLION CYST EXCISION  2011  . lipoma removal    . right bunioectomy    . SHOULDER SURGERY  01/13/2011   right, partial tear  . tumor resection left thigh      There were no vitals filed for this visit.  Subjective Assessment - 03/01/18 0848    Subjective  Pt denied falls or changes since last visit.  Pt reports her R foot is doing great and she's needed to wear boot to work this week.      Pertinent History  OT for shoulder pain, PT at [facility] for hip; multiple falls, achilles tendonitis, ADHD, OA, bipolar disorder, cataracts bilaterally, eczema, HTN, lipoma with removal, migraine, personality disorder, suicide ideation, L ankle surgery, R bunionectomy, R shoulder surgery and L thigh tumor resection    Patient Stated Goals  To be more steady and to improve neck ROM    Currently in Pain?  No/denies         Therex and Neuro re-ed: Access Code: [email or web address]  URL: [email or web address]  Date: 03/01/2018  Prepared by: Geoffry Paradise   Exercises  Romberg Stance with Head Nods - 10 reps - 2-3 sets - 1x daily - 5x weekly (NMR) Romberg Stance Eyes Closed on Foam Pad - 1 reps - 3 sets - 10-30 SECONDS hold - 1x daily - 5x weekly (NMR) Backward Walking with Counter Support - 4 reps - 1 sets - 1x daily - 5x weekly (NMR) Walking with Head Rotation - 4 reps - 1 sets - 1x daily - 5x weekly  (NMR) Seated Scapular Retraction - 10 reps - 3 sets - 1x daily - 3x  weekly (NMR) Correct Seated Posture - 1x daily - 7x weekly (NMR) Supine Chin Tuck - 5 reps - 2 sets - 5 hold - 1x daily - 7x weekly (THEREX) Standing on Foam Pad - 1 reps - 3 sets - 30 hold - 1x daily - 5x weekly (NMR) Seated Backward Shoulder Rolls - 10 reps - 2x daily - 7x weekly (THEREX) Seated Upper Trapezius Stretch - 2 reps - 20-30 second hold - 2x daily - 7x weekly (THEREX) Cues and demo for technique. Incr. Postural sway noted during activities which require incr. Vestibular input. All activities performed with S for safety.  No pain reported during session.                      PT Education - 03/01/18 0959    Education provided  Yes    Education Details  PT reviewed HEP and modified as indicated. PT again discussed goal progress and renewal process.     Person(s) Educated  Patient    Methods  Explanation;Demonstration;Verbal cues;Handout    Comprehension  Verbalized  understanding;Returned demonstration       PT Short Term Goals - 02/01/18 1151      PT SHORT TERM GOAL #1   Title  Pt will participate in full vestibular assessment and will initiate vestibular HEP    Time  4    Period  Weeks    Status  Achieved      PT SHORT TERM GOAL #2   Title  Pt will participate in further balance assessment (gait velocity, FGA) and will initiate balance and strengthening HEP    Time  4    Period  Weeks    Status  Achieved      PT SHORT TERM GOAL #3   Title  Pt will demonstrate independence with initial cervical ROM and postural exercises for HEP    Time  4    Period  Weeks    Status  Partially Met      PT SHORT TERM GOAL #4   Title  Pt will improve cervical ROM by 5-8 degrees and report </= 4/10 pain with movement    Time  4    Period  Weeks    Status  Partially Met      PT SHORT TERM GOAL #5   Title  Pt will improve FGA score to >/=15/30 to decr. falls risk.     Status  Achieved        PT Long Term Goals - 03/01/18 1010      PT LONG TERM GOAL #1   Title  Pt will be independent with vestibular, balance, neck/posture HEP  (ALL LTG DUE BY 03/03/18-original POC)    Baseline  all unmet goals will be carried over to new POC: 03/29/18    Time  8    Period  Weeks    Status  Partially Met      PT LONG TERM GOAL #2   Title  Pt will demonstrate 10-12 deg increase in cervical spine ROM and </= 2/10 pain with movement    Time  8    Period  Weeks    Status  Partially Met      PT LONG TERM GOAL #3   Title  Pt will improve gait velocity to >/= 3.0 ft/sec with no evidence of imbalance or needing to touch walls/furniture    Time  8    Period  Weeks    Status  Achieved      PT LONG TERM GOAL #4   Title  Pt will improve FGA to >/=25/30 to decrease falls risk during gait    Baseline  21/30 on 02/26/18    Time  8    Period  Weeks    Status  Revised      PT LONG TERM GOAL #5   Title  Vestibular goal if indicated    Status  On-going             Plan - 03/01/18 0850    Clinical Impression Statement  Today's skilled session was focused on reviewing, progressing and modifying HEP. Pt partially met LTG 1, as she required cues and demo for technique. Pt continues to experience decr. Cervical ROM, intermittent pain, impaired balance and strength. Therefore, PT requesting additional 2x/week for 4 weeks to improve safety during functional mobility.     Rehab Potential  Good    PT Frequency  2x / week   original POC: 2x/week for 8 weeks   PT Duration  4 weeks    PT Treatment/Interventions  ADLs/Self Care Home Management;Cryotherapy;Lobbyist;Therapeutic exercise;Therapeutic activities;Functional mobility training;Patient/family education;Manual techniques;Canalith Repostioning;Moist Heat;Gait training;Neuromuscular re-education;Passive range of motion;Dry needling;Vestibular    PT Next Visit Plan  Continue dry needling and stretching of L upper trap/rhomboids, postural exercises.  Vestibular exercises.   High level balance to encourage incr. vestibular input.     PT Home Exercise Plan  [email or web address]    Consulted and Agree with Plan of Care  Patient       Patient will benefit from skilled therapeutic intervention in order to improve the following deficits and impairments:  Decreased range of motion, Difficulty walking, Increased muscle spasms, Pain, Decreased balance, Impaired flexibility, Postural dysfunction, Decreased strength, Decreased mobility, Decreased cognition, Dizziness  Visit Diagnosis: Dizziness and giddiness - Plan: PT plan of care cert/re-cert  Cervicalgia - Plan: PT plan of care cert/re-cert  Muscle weakness (generalized) - Plan: PT plan of care cert/re-cert  Unsteadiness on feet - Plan: PT plan of care cert/re-cert  Repeated falls - Plan: PT plan of care cert/re-cert     Problem List Patient Active Problem List   Diagnosis Date Noted  . Monoallelic mutation of CHEK2 gene in  female patient 02/27/2018  . Genetic testing 02/27/2018  . Family history of genetic disease carrier   . Family history of breast cancer   . Family history of colon cancer   . Head injury with loss of consciousness ([place]) 11/20/2017  . Nausea 11/20/2017  . 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 Tremor observed on examination 11/20/2017  . Left shoulder pain 02/18/2017  . MDD (major depressive disorder), recurrent severe, without psychosis ([place]) 12/28/2016  . Muscle strain 09/19/2016  . Right foot injury, subsequent encounter 08/25/2016  . Low back pain 06/27/2016  . Left wrist injury, subsequent encounter 06/27/2016  . Pain of left thumb 10/07/2015  . 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 Strain of left thumb 02/11/2015  . Strain of right forearm 02/11/2015  . Right shoulder pain 12/31/2014  . Injury of right little finger 12/31/2014  . Episodic cluster headache, 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  . 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 Hypersomnia, recurrent 12/02/2014  . Migraine aura, persistent, intractable, with status migrainosus 12/02/2014  . Lower back injury 09/30/2014  . Bipolar I disorder, most recent episode depressed ([place])   .  MDD (major depressive disorder), recurrent, severe, with psychosis ([place]) 09/23/2014  . Injury of left index finger 08/20/2014  . Right ankle sprain 06/01/2014  . Contusion, multiple sites 06/01/2014  . Strain of right gastrocnemius muscle 06/01/2014  . Phonophobia 05/04/2014  . Photophobia of both eyes 05/04/2014  . Emotionally unstable borderline personality disorder ([place]) 05/04/2014  . Nausea with vomiting 05/04/2014  . Mixed bipolar I disorder ([place])   . Bipolar I disorder, most recent episode mixed ([place]) 04/18/2014  . Bipolar affective disorder, depressed, mild ([place]) 04/12/2014  .  Suicidal ideation 04/12/2014  . Injury of right shoulder and upper arm 02/17/2014  . Left leg pain 06/03/2013  . Right hip pain 06/03/2013  . Migraine with status migrainosus 01/08/2013  . Personality disorder ([place])   . Chronic migraine 05/08/2012  . Contact dermatitis 11/27/2011  . Major depressive disorder, recurrent episode ([place]) 10/27/2011  . Generalized anxiety disorder 10/27/2011  . 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 Borderline personality disorder ([place]) 10/27/2011  . Right foot pain 09/28/2011  . 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 Ganglion cyst 09/29/2009  . PES PLANUS 07/01/2008  . BIPOLAR DISORDER UNSPECIFIED 06/09/2008    , L 03/01/2018, 10:16 AM  [facility] [address] [place], [place], [address] Phone: [phone]   Fax:  [phone]  Name: ANNELIESE LEBLOND MRN: [id number] Date of Birth: 1970-04-29  Geoffry Paradise, PT,DPT 03/01/18 10:16 AM Phone: [phone] Fax: [phone]

## 2018-03-01 NOTE — Patient Instructions (Signed)
Access Code: [email or web address]  URL: [email or web address]  Date: 03/01/2018  Prepared by: Geoffry Paradise   Exercises  Romberg Stance with Head Nods - 10 reps - 2-3 sets - 1x daily - 5x weekly  Romberg Stance Eyes Closed on Foam Pad - 1 reps - 3 sets - 10-30 SECONDS hold - 1x daily - 5x weekly  Backward Walking with Counter Support - 4 reps - 1 sets - 1x daily - 5x weekly  Walking with Head Rotation - 4 reps - 1 sets - 1x daily - 5x weekly  Seated Scapular Retraction - 10 reps - 3 sets - 1x daily - 3x weekly  Correct Seated Posture - 1x daily - 7x weekly  Supine Chin Tuck - 5 reps - 2 sets - 5 hold - 1x daily - 7x weekly  Standing on Foam Pad - 1 reps - 3 sets - 30 hold - 1x daily - 5x weekly  Seated Backward Shoulder Rolls - 10 reps - 2x daily - 7x weekly  Seated Upper Trapezius Stretch - 2 reps - 20-30 second hold - 2x daily - 7x weekly

## 2018-03-05 ENCOUNTER — Ambulatory Visit: Payer: PPO | Attending: Family Medicine | Admitting: Speech Pathology

## 2018-03-05 ENCOUNTER — Encounter: Payer: Self-pay | Admitting: Speech Pathology

## 2018-03-05 DIAGNOSIS — R293 Abnormal posture: Secondary | ICD-10-CM | POA: Insufficient documentation

## 2018-03-05 DIAGNOSIS — R42 Dizziness and giddiness: Secondary | ICD-10-CM | POA: Insufficient documentation

## 2018-03-05 DIAGNOSIS — R41841 Cognitive communication deficit: Secondary | ICD-10-CM | POA: Diagnosis not present

## 2018-03-05 DIAGNOSIS — M542 Cervicalgia: Secondary | ICD-10-CM | POA: Diagnosis not present

## 2018-03-05 DIAGNOSIS — R2681 Unsteadiness on feet: Secondary | ICD-10-CM | POA: Insufficient documentation

## 2018-03-05 DIAGNOSIS — M6281 Muscle weakness (generalized): Secondary | ICD-10-CM | POA: Insufficient documentation

## 2018-03-05 DIAGNOSIS — R296 Repeated falls: Secondary | ICD-10-CM | POA: Diagnosis not present

## 2018-03-05 DIAGNOSIS — R4701 Aphasia: Secondary | ICD-10-CM | POA: Insufficient documentation

## 2018-03-05 NOTE — Patient Instructions (Addendum)
   Try mini note book in you pocket - use it to write down your schedules, notes for work, any lists or to do's you have to remember  When you come home from work, take a brain break in a dark, quiet room to let your brain re-set  Be aware of how noises and bright lights affect your brain - restaurants, crowds, stores - listen to your body and take breaks  Use visual sign/reminders to greet others in the line - maybe a sticky note on the register that says "greet line" or something   Double check your hours - set the alarm on your phone to vibrate when you are to clock out  Pick one TV or computer - both is too much for your brain  Your brain needs a break after work - your brain is working extra hard to process sensory input   Quiet is your brain's friend  Limit screen to 45 to 60 minutes

## 2018-03-05 NOTE — Therapy (Signed)
[facility] [address] [place], [place], [address] Phone: [phone]   Fax:  [phone]  Speech Language Pathology Treatment  Patient Details  Name: [name] MRN: [id number] Date of Birth: January 03, 1971 Referring Provider (SLP): Seymour Bars, NP   Encounter Date: 03/05/2018  End of Session - 03/05/18 1112    Visit Number  9    Number of Visits  17    Date for SLP Re-Evaluation  04/19/18    SLP Start Time  0846    SLP Stop Time   0930    SLP Time Calculation (min)  44 min    Activity Tolerance  Patient tolerated treatment well       Past Medical History:  Diagnosis Date  . Achilles tendinitis   . Achilles tendinitis   . ADHD (attention deficit hyperactivity disorder)   . Allergy   . Arthritis   . Bipolar affective ([place])   . Bipolar disorder ([place])   . Cataracts, bilateral   . Eczema   . Family history of breast cancer   . Family history of colon cancer   . Family history of genetic disease carrier   . Ganglion cyst 09/29/2009   left wrist (2 cyst)  . Hyperprolactinemia ([place])   . Hypertension   . Lipoma   . Migraine   . Personality disorder ([place])   . Pes planus     Past Surgical History:  Procedure Laterality Date  . ANKLE SURGERY  12/88   left   . chest nodule  1990?   rt chest wall nodule removal  . GANGLION CYST EXCISION  2011  . lipoma removal    . right bunioectomy    . SHOULDER SURGERY  01/13/2011   right, partial tear  . tumor resection left thigh      There were no vitals filed for this visit.  Subjective Assessment - 03/05/18 0851    Subjective  Pt returns with detailed budget for her vacation and detailed meal plan"    Currently in Pain?  No/denies            ADULT SLP TREATMENT - 03/05/18 0856      General Information   Behavior/Cognition  Alert;Cooperative      Treatment Provided   Treatment provided  Cognitive-Linquistic      Cognitive-Linquistic  Treatment   Treatment focused on  Cognition    Skilled Treatment  Pt has not implemented mini notebook at work as shehasn't had much shifts. Pt reports forgetting to do PT exercises and PT HEP log. Reviewed this with her. 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 Utilizing a stick note that reads "Greet the Line" on her register will be trialed.  In simple conversation re: work duties, pt distracted mid sentence verbally noting a loud motor outside, then required verbal cues to recall the topic and continue talking on topic.       Assessment / Recommendations / Plan   Plan  Continue with current plan of care      Progression Toward Goals   Progression toward goals  Progressing toward goals       SLP Education - 03/05/18 1109    Education Details  compensations for memory and attention,  external reminders at work (sticky note, timer)    Person(s) Educated  Patient    Methods  Explanation;Verbal cues;Handout    Comprehension  Verbalized understanding;Verbal cues required;Need further instruction       SLP Short Term Goals - 03/05/18 1111      SLP SHORT TERM GOAL #1   Title  pt will complete cognitive linguistic testing in the first 2 therapy sessions    Status  Achieved      SLP SHORT TERM GOAL #2   Title  pt will name average 14 items in common categories in 60 seconds    Time  1    Period  Weeks    Status  On-going      SLP SHORT TERM GOAL #3   Title  pt will successfully use at least 2 compensatory strategies for anomia multiple times in 10 mintues of conversation over two sessions    Baseline  02-05-18; 03/05/18    Time  1    Status  Achieved      SLP SHORT TERM GOAL #4   Title  pt will demo organization/executive function skills adequate for simple functional  tasks with rare min A    Time  1    Period  Weeks    Status  Achieved      SLP SHORT TERM GOAL #5   Title  pt will report 2 functional memory compensations she uses between 3 sessions    Baseline  02-11-18, 02-15-18,    Time  1    Period  Weeks    Status  On-going      SLP SHORT TERM GOAL #6   Title  to improve pt's attention skills, pt will use compensations for attention in 2 sessions    Baseline  02-11-18    Time  2    Period  Weeks    Status  New       SLP Long Term Goals - 03/05/18 1111      SLP LONG TERM GOAL #1   Title  pt will improve her Boston Naming Test score to at least 51/60 on her last day of therapy    Time  4    Period  Weeks   17 sessions, for all LTGs   Status  On-going      SLP LONG TERM GOAL #2   Title  pt will use anomia compensations successfully in 15 minutes mod complex conversation over 3 therapy sessions    Baseline  02-15-18    Time  4    Period  Weeks    Status  Revised      SLP LONG TERM GOAL #3   Title  pt will report successful usage of memory compensations between 3 sessions    Time  4    Period  Weeks    Status  New      SLP LONG TERM GOAL #4   Title  pt will perform executive function pertinent for everyday cognitive-linguistic tasks with modified independence in 3 sessions    Time  4    Period  Weeks    Status  New      SLP LONG TERM GOAL #5   Title  pt will demo functional divided attention skills with modified independence within 3 ST sessions    Time  5    Period  Weeks    Status  On-going       Plan - 03/05/18 1110    Clinical  Impression Statement  [name] continues to experience difficulty completing tasks at work and higher level ADL's, such as grocery shopping, due to memory, attention ,processing  and sensory hyperstimulation. Initiated Social worker, use of mini notepad to write down her to do list at work. Continue skilled ST to maximize congition and word finding for high level ADL's, work , and QOL.     Duration  --    8 weeks or 17 visits   Treatment/Interventions  Language facilitation;Environmental controls;Cueing hierarchy;SLP instruction and feedback;Compensatory strategies;Cognitive reorganization;Functional tasks;Internal/external aids;Multimodal communcation approach;Patient/family education    Potential to Achieve Goals  Good       Patient will benefit from skilled therapeutic intervention in order to improve the following deficits and impairments:   Cognitive communication deficit  Aphasia    Problem List Patient Active Problem List   Diagnosis Date Noted  . 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 Genetic testing 02/27/2018  . Family history of genetic disease carrier   . Family history of breast cancer   . Family history of colon cancer   . Head injury with loss of consciousness ([place]) 11/20/2017  . Nausea 11/20/2017  . 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 Tremor observed on examination 11/20/2017  . Left shoulder pain 02/18/2017  . MDD (major depressive disorder), recurrent severe, without psychosis ([place]) 12/28/2016  . Muscle strain 09/19/2016  . Right foot injury, subsequent encounter 08/25/2016  . Low back pain 06/27/2016  . Left wrist injury, subsequent encounter 06/27/2016  . Pain of left thumb 10/07/2015  . 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 Strain of left thumb 02/11/2015  . Strain of right forearm 02/11/2015  . Right shoulder pain 12/31/2014  . Injury of right little finger 12/31/2014  . Episodic cluster headache, 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  . 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 Hypersomnia, recurrent 12/02/2014  . Migraine aura, persistent, intractable, with status migrainosus 12/02/2014  . Lower back injury 09/30/2014  . Bipolar I disorder, most recent episode depressed ([place])    . MDD (major depressive disorder), recurrent, severe, with psychosis ([place]) 09/23/2014  . Injury of left index finger 08/20/2014  . Right ankle sprain 06/01/2014  . Contusion, multiple sites 06/01/2014  . Strain of right gastrocnemius muscle 06/01/2014  . Phonophobia 05/04/2014  . Photophobia of both eyes 05/04/2014  . Emotionally unstable borderline personality disorder ([place]) 05/04/2014  . Nausea with vomiting 05/04/2014  . Mixed bipolar I disorder ([place])   . Bipolar I disorder, most recent episode mixed ([place]) 04/18/2014  . Bipolar affective disorder, depressed, mild ([place]) 04/12/2014  . Suicidal ideation 04/12/2014  . Injury of right shoulder and upper arm 02/17/2014  . Left leg pain 06/03/2013  . Right hip pain 06/03/2013  . Migraine with status migrainosus 01/08/2013  . Personality disorder ([place])   . Chronic migraine 05/08/2012  . Contact dermatitis 11/27/2011  . Major depressive disorder, recurrent episode ([place]) 10/27/2011  . Generalized anxiety disorder 10/27/2011  . 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 Borderline personality disorder ([place]) 10/27/2011  . Right foot pain 09/28/2011  . 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 Ganglion cyst 09/29/2009  . PES PLANUS 07/01/2008  . BIPOLAR DISORDER UNSPECIFIED 06/09/2008    Orvie Caradine, Annye Rusk MS, CCC-SLP 03/05/2018, 11:13 AM  [facility]  [place], [place], [address] Phone: [phone]   Fax:  [phone]   Name: ALESSA MAZUR MRN: [id number] Date of Birth: [birth year]

## 2018-03-08 ENCOUNTER — Encounter

## 2018-03-12 ENCOUNTER — Ambulatory Visit: Payer: PPO | Admitting: Speech Pathology

## 2018-03-12 DIAGNOSIS — R41841 Cognitive communication deficit: Secondary | ICD-10-CM

## 2018-03-12 DIAGNOSIS — R4701 Aphasia: Secondary | ICD-10-CM

## 2018-03-12 NOTE — Patient Instructions (Addendum)
  Write out a Artist for all of your expenses and bills (water, cable, credit cards, food, [facility], therapy, other expenses) and figure out how much you can set a side and how much you can spend  Make files for each of your bills  Organize to help you stay focused  Mini notebook for work   Tell your mom you need more time to process speech/language+

## 2018-03-12 NOTE — Therapy (Signed)
[facility] [address] [place], [place], [address] Phone: [phone]   Fax:  [phone]  Speech Language Pathology Treatment  Patient Details  Name: [name] MRN: [id number] Date of Birth: October 05, 1970 Referring Provider (SLP): Seymour Bars, NP   Encounter Date: 03/12/2018  End of Session - 03/12/18 1502    Visit Number  10    Number of Visits  17    Date for SLP Re-Evaluation  04/19/18    SLP Start Time  3300    SLP Stop Time   7622    SLP Time Calculation (min)  44 min    Activity Tolerance  Patient tolerated treatment well       Past Medical History:  Diagnosis Date  . Achilles tendinitis   . Achilles tendinitis   . ADHD (attention deficit hyperactivity disorder)   . Allergy   . Arthritis   . Bipolar affective ([place])   . Bipolar disorder ([place])   . Cataracts, bilateral   . Eczema   . Family history of breast cancer   . Family history of colon cancer   . Family history of genetic disease carrier   . Ganglion cyst 09/29/2009   left wrist (2 cyst)  . Hyperprolactinemia ([place])   . Hypertension   . Lipoma   . Migraine   . Personality disorder ([place])   . Pes planus     Past Surgical History:  Procedure Laterality Date  . ANKLE SURGERY  12/88   left   . chest nodule  1990?   rt chest wall nodule removal  . GANGLION CYST EXCISION  2011  . lipoma removal    . right bunioectomy    . SHOULDER SURGERY  01/13/2011   right, partial tear  . tumor resection left thigh      There were no vitals filed for this visit.  Subjective Assessment - 03/12/18 1407    Subjective  "I ran into a shelve and it broke the skin"    Currently in Pain?  Yes    Pain Score  4     Pain Location  Head    Pain Orientation  --   headache   Pain Descriptors / Indicators  Aching    Pain Type  Chronic pain    Pain Onset  In the past 7 days    Pain Frequency  Intermittent    Aggravating Factors   rain    Pain  Relieving Factors  rest          Speech Therapy Progress Note  Dates of Reporting Period: 01/22/18  to 03/12/18     ADULT SLP TREATMENT - 03/12/18 1409      General Information   Behavior/Cognition  Alert;Cooperative      Treatment Provided   Treatment provided  Cognitive-Linquistic      Cognitive-Linquistic Treatment   Treatment focused on  Cognition    Skilled Treatment  Pt reports difficulty managing her money, loosing bills and running out of money. Since her concussoin, her mom has had to manage her finances. We collaboratively generated a strategy of Writing out a budget - monthly expenses (food, bills, therapy, cable, water, [facility], etc) make a file for each bill  to set limit on spending money. [name] has carried over PT exercises using the log, with mod I. She did not carryover external aid (sticky note) at work to remember to clock out on time and greet her line.  We reviwed this.  Assessment / Recommendations / Plan   Plan  Continue with current plan of care      Progression Toward Goals   Progression toward goals  Progressing toward goals       SLP Education - 03/12/18 1500    Education Details  compensation for attention and organization with bills    Person(s) Educated  Patient    Methods  Explanation;Verbal cues    Comprehension  Verbalized understanding;Verbal cues required;Need further instruction       SLP Short Term Goals - 03/12/18 1501      SLP SHORT TERM GOAL #1   Title  pt will complete cognitive linguistic testing in the first 2 therapy sessions    Status  Achieved      SLP SHORT TERM GOAL #2   Title  pt will name average 14 items in common categories in 60 seconds    Time  1    Period  Weeks    Status  On-going      SLP SHORT TERM GOAL #3   Title  pt will successfully use at least 2 compensatory strategies for anomia multiple times in 10 mintues of conversation over two sessions    Baseline  02-05-18; 03/05/18    Time  1    Status  Achieved       SLP SHORT TERM GOAL #4   Title  pt will demo organization/executive function skills adequate for simple functional tasks with rare min A    Time  1    Period  Weeks    Status  Achieved      SLP SHORT TERM GOAL #5   Title  pt will report 2 functional memory compensations she uses between 3 sessions    Baseline  02-11-18, 02-15-18,    Time  1    Period  Weeks    Status  On-going      SLP SHORT TERM GOAL #6   Title  to improve pt's attention skills, pt will use compensations for attention in 2 sessions    Baseline  02-11-18; 03/12/18    Time  2    Period  Weeks    Status  New       SLP Long Term Goals - 03/12/18 1502      SLP LONG TERM GOAL #1   Title  pt will improve her Boston Naming Test score to at least 51/60 on her last day of therapy    Time  4    Period  Weeks   17 sessions, for all LTGs   Status  On-going      SLP LONG TERM GOAL #2   Title  pt will use anomia compensations successfully in 15 minutes mod complex conversation over 3 therapy sessions    Baseline  02-15-18    Time  3    Period  Weeks    Status  Revised      SLP LONG TERM GOAL #3   Title  pt will report successful usage of memory compensations between 3 sessions    Time  4    Period  Weeks    Status  New      SLP LONG TERM GOAL #4   Title  pt will perform executive function pertinent for everyday cognitive-linguistic tasks with modified independence in 3 sessions    Time  3    Period  Weeks    Status  New      SLP LONG TERM GOAL #5  Title  pt will demo functional divided attention skills with modified independence within 3 ST sessions    Time  4    Period  Weeks    Status  On-going       Plan - 03/12/18 1501    Clinical Impression Statement  [name] continues to experience difficulty completing tasks at work and higher level ADL's, such as grocery shopping, due to memory, attention ,processing  and sensory hyperstimulation. Initiated Social worker, use of mini notepad to write down her to  do list at work. Continue skilled ST to maximize congition and word finding for high level ADL's, work , and QOL.     Speech Therapy Frequency  2x / week    Duration  --   8 weeks or 17 visits   Treatment/Interventions  Language facilitation;Environmental controls;Cueing hierarchy;SLP instruction and feedback;Compensatory strategies;Cognitive reorganization;Functional tasks;Internal/external aids;Multimodal communcation approach;Patient/family education    Consulted and Agree with Plan of Care  Patient       Patient will benefit from skilled therapeutic intervention in order to improve the following deficits and impairments:   Cognitive communication deficit  Aphasia    Problem List Patient Active Problem List   Diagnosis Date Noted  . 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 Genetic testing 02/27/2018  . Family history of genetic disease carrier   . Family history of breast cancer   . Family history of colon cancer   . Head injury with loss of consciousness ([place]) 11/20/2017  . Nausea 11/20/2017  . 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 Tremor observed on examination 11/20/2017  . Left shoulder pain 02/18/2017  . MDD (major depressive disorder), recurrent severe, without psychosis ([place]) 12/28/2016  . Muscle strain 09/19/2016  . Right foot injury, subsequent encounter 08/25/2016  . Low back pain 06/27/2016  . Left wrist injury, subsequent encounter 06/27/2016  . Pain of left thumb 10/07/2015  . 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 Strain of left thumb 02/11/2015  . Strain of right forearm 02/11/2015  . Right shoulder pain 12/31/2014  . Injury of right little finger 12/31/2014  . Episodic cluster headache, 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  . 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 Hypersomnia,  recurrent 12/02/2014  . Migraine aura, persistent, intractable, with status migrainosus 12/02/2014  . Lower back injury 09/30/2014  . Bipolar I disorder, most recent episode depressed ([place])   . MDD (major depressive disorder), recurrent, severe, with psychosis ([place]) 09/23/2014  . Injury of left index finger 08/20/2014  . Right ankle sprain 06/01/2014  . Contusion, multiple sites 06/01/2014  . Strain of right gastrocnemius muscle 06/01/2014  . Phonophobia 05/04/2014  . Photophobia of both eyes 05/04/2014  . Emotionally unstable borderline personality disorder ([place]) 05/04/2014  . Nausea with vomiting 05/04/2014  . Mixed bipolar I disorder ([place])   . Bipolar I disorder, most recent episode mixed ([place]) 04/18/2014  . Bipolar affective disorder, depressed, mild ([place]) 04/12/2014  . Suicidal ideation 04/12/2014  . Injury of right shoulder and upper arm 02/17/2014  . Left leg pain 06/03/2013  . Right hip pain 06/03/2013  . Migraine with status migrainosus 01/08/2013  . Personality disorder ([place])   . Chronic migraine 05/08/2012  . Contact dermatitis 11/27/2011  . Major depressive disorder, recurrent episode ([place]) 10/27/2011  . Generalized anxiety disorder 10/27/2011  . 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  Borderline personality disorder ([place]) 10/27/2011  . Right foot pain 09/28/2011  . 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 Ganglion cyst 09/29/2009  . PES PLANUS 07/01/2008  . BIPOLAR DISORDER UNSPECIFIED 06/09/2008    Jaymi Tinner, Annye Rusk MS, CCC-SLP 03/12/2018, 3:04 PM  [facility] [address] [place], [place], [address] Phone: [phone]   Fax:  [phone]   Name: [name] MRN: [id number] Date of Birth: August 03, 1970

## 2018-03-14 ENCOUNTER — Ambulatory Visit: Payer: PPO | Admitting: Speech Pathology

## 2018-03-14 ENCOUNTER — Encounter: Payer: Self-pay | Admitting: Speech Pathology

## 2018-03-14 DIAGNOSIS — R41841 Cognitive communication deficit: Secondary | ICD-10-CM

## 2018-03-14 DIAGNOSIS — R4701 Aphasia: Secondary | ICD-10-CM

## 2018-03-14 NOTE — Therapy (Signed)
[facility] [address] [place], [place], [address] Phone: [phone]   Fax:  [phone]  Speech Language Pathology Treatment  Patient Details  Name: [name] MRN: [id number] Date of Birth: 1970-03-26 Referring Provider (SLP): Seymour Bars, NP   Encounter Date: 03/14/2018  End of Session - 03/14/18 1518    Visit Number  11    Number of Visits  17    Date for SLP Re-Evaluation  04/19/18    SLP Start Time  1062    SLP Stop Time   1400    SLP Time Calculation (min)  43 min    Activity Tolerance  Patient tolerated treatment well       Past Medical History:  Diagnosis Date  . Achilles tendinitis   . Achilles tendinitis   . ADHD (attention deficit hyperactivity disorder)   . Allergy   . Arthritis   . Bipolar affective ([place])   . Bipolar disorder ([place])   . Cataracts, bilateral   . Eczema   . Family history of breast cancer   . Family history of colon cancer   . Family history of genetic disease carrier   . Ganglion cyst 09/29/2009   left wrist (2 cyst)  . Hyperprolactinemia ([place])   . Hypertension   . Lipoma   . Migraine   . Personality disorder ([place])   . Pes planus     Past Surgical History:  Procedure Laterality Date  . ANKLE SURGERY  12/88   left   . chest nodule  1990?   rt chest wall nodule removal  . GANGLION CYST EXCISION  2011  . lipoma removal    . right bunioectomy    . SHOULDER SURGERY  01/13/2011   right, partial tear  . tumor resection left thigh      There were no vitals filed for this visit.  Subjective Assessment - 03/14/18 1512    Currently in Pain?  No/denies            ADULT SLP TREATMENT - 03/14/18 1328      General Information   Behavior/Cognition  Alert;Cooperative      Cognitive-Linquistic Treatment   Treatment focused on  Cognition    Skilled Treatment  Pt forgot her notebook - she did her budget, but reports she forgot her food, gas, meds and will  add those to her budget. Pt reports she has initiated organizing her clothes and room to improve attention and efficiency with ADL's. Generated external aid (sticky note) for her register to remind her to ask about credit cards and greet the line. She has had difficulty remembering to do this.       Assessment / Recommendations / Plan   Plan  Continue with current plan of care      Progression Toward Goals   Progression toward goals  Progressing toward goals       SLP Education - 03/14/18 1513    Education Details  compenstion for memory    Person(s) Educated  Patient    Methods  Explanation;Verbal cues;Handout    Comprehension  Verbalized understanding;Verbal cues required;Need further instruction       SLP Short Term Goals - 03/14/18 1516      SLP SHORT TERM GOAL #1   Title  pt will complete cognitive linguistic testing in the first 2 therapy sessions    Status  Achieved      SLP SHORT TERM GOAL #2   Title  pt  will name average 14 items in common categories in 60 seconds    Time  1    Period  Weeks    Status  On-going      SLP SHORT TERM GOAL #3   Title  pt will successfully use at least 2 compensatory strategies for anomia multiple times in 10 mintues of conversation over two sessions    Baseline  02-05-18; 03/05/18    Time  1    Status  Achieved      SLP SHORT TERM GOAL #4   Title  pt will demo organization/executive function skills adequate for simple functional tasks with rare min A    Time  1    Period  Weeks    Status  Achieved      SLP SHORT TERM GOAL #5   Title  pt will report 2 functional memory compensations she uses between 3 sessions    Baseline  02-11-18, 02-15-18,    Time  1    Period  Weeks    Status  On-going      SLP SHORT TERM GOAL #6   Title  to improve pt's attention skills, pt will use compensations for attention in 2 sessions    Baseline  02-11-18; 03/12/18    Time  2    Period  Weeks    Status  On-going       SLP Long Term Goals - 03/14/18  1517      SLP LONG TERM GOAL #1   Title  pt will improve her Boston Naming Test score to at least 51/60 on her last day of therapy    Time  4    Period  Weeks   17 sessions, for all LTGs   Status  On-going      SLP LONG TERM GOAL #2   Title  pt will use anomia compensations successfully in 15 minutes mod complex conversation over 3 therapy sessions    Baseline  02-15-18    Time  3    Period  Weeks    Status  On-going      SLP LONG TERM GOAL #3   Title  pt will report successful usage of memory compensations between 3 sessions    Baseline  03/14/18    Time  4    Period  Weeks    Status  On-going      SLP LONG TERM GOAL #4   Title  pt will perform executive function pertinent for everyday cognitive-linguistic tasks with modified independence in 3 sessions    Baseline  trip planning; itinerary; monthly budget    Time  3    Period  Weeks    Status  New      SLP LONG TERM GOAL #5   Title  pt will demo functional divided attention skills with modified independence within 3 ST sessions    Time  4    Period  Weeks    Status  Deferred       Plan - 03/14/18 1513    Clinical Impression Statement  Pt reports improvement of remembering to clock out on time and ask for emails at work. She continues to report and demonstrate reduced carryover of external aids at work, provided them today for her. Pt did write down a question she had for me in her memory notebook. Continue skilled ST to maximize cognition and word finding for high level ADL's.    Speech Therapy Frequency  2x / week  Duration  --   8 weeks or 17 visits   Treatment/Interventions  Language facilitation;Environmental controls;Cueing hierarchy;SLP instruction and feedback;Compensatory strategies;Cognitive reorganization;Functional tasks;Internal/external aids;Multimodal communcation approach;Patient/family education    Potential to Achieve Goals  Good    Potential Considerations  Family/community support       Patient  will benefit from skilled therapeutic intervention in order to improve the following deficits and impairments:   Cognitive communication deficit  Aphasia    Problem List Patient Active Problem List   Diagnosis Date Noted  . 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 Genetic testing 02/27/2018  . Family history of genetic disease carrier   . Family history of breast cancer   . Family history of colon cancer   . Head injury with loss of consciousness ([place]) 11/20/2017  . Nausea 11/20/2017  . 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 Tremor observed on examination 11/20/2017  . Left shoulder pain 02/18/2017  . MDD (major depressive disorder), recurrent severe, without psychosis ([place]) 12/28/2016  . Muscle strain 09/19/2016  . Right foot injury, subsequent encounter 08/25/2016  . Low back pain 06/27/2016  . Left wrist injury, subsequent encounter 06/27/2016  . Pain of left thumb 10/07/2015  . 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 Strain of left thumb 02/11/2015  . Strain of right forearm 02/11/2015  . Right shoulder pain 12/31/2014  . Injury of right little finger 12/31/2014  . Episodic cluster headache, 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  . 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 Hypersomnia, recurrent 12/02/2014  . Migraine aura, persistent, intractable, with status migrainosus 12/02/2014  . Lower back injury 09/30/2014  . Bipolar I disorder, most recent episode depressed ([place])   . MDD (major depressive disorder), recurrent, severe, with psychosis ([place]) 09/23/2014  . Injury of left index finger 08/20/2014  . Right ankle sprain 06/01/2014  . Contusion, multiple sites 06/01/2014  . Strain of right gastrocnemius muscle 06/01/2014  . Phonophobia 05/04/2014  . Photophobia of both eyes 05/04/2014  . Emotionally  unstable borderline personality disorder ([place]) 05/04/2014  . Nausea with vomiting 05/04/2014  . Mixed bipolar I disorder ([place])   . Bipolar I disorder, most recent episode mixed ([place]) 04/18/2014  . Bipolar affective disorder, depressed, mild ([place]) 04/12/2014  . Suicidal ideation 04/12/2014  . Injury of right shoulder and upper arm 02/17/2014  . Left leg pain 06/03/2013  . Right hip pain 06/03/2013  . Migraine with status migrainosus 01/08/2013  . Personality disorder ([place])   . Chronic migraine 05/08/2012  . Contact dermatitis 11/27/2011  . Major depressive disorder, recurrent episode ([place]) 10/27/2011  . Generalized anxiety disorder 10/27/2011  . 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 Borderline personality disorder ([place]) 10/27/2011  . Right foot pain 09/28/2011  . 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 Ganglion cyst 09/29/2009  . PES PLANUS 07/01/2008  . BIPOLAR DISORDER UNSPECIFIED 06/09/2008    Jaydn Fincher, Annye Rusk MS, CCC-SLP 03/14/2018, 3:19 PM  [facility] [address] [place], [place], [address] Phone: [phone]   Fax:  [phone]   Name: ANNINA PIOTROWSKI MRN: [id number] Date of Birth: 06/13/1970

## 2018-03-14 NOTE — Patient Instructions (Addendum)
  Read your speech notes to your mom  Mom is welcome to attend speech if you want  It's OK to take naps under 1 hour a day  Keep working on your room organization to help your brain - work 20-30 minutes then take a break  Ask your mom if she has noticed any changes in your behavior or thinking since the concussion  [email or web address]  TBI coach - Smitty Pluck - youtube

## 2018-03-15 ENCOUNTER — Encounter: Payer: Self-pay | Admitting: Physical Therapy

## 2018-03-15 ENCOUNTER — Ambulatory Visit: Payer: PPO | Admitting: Physical Therapy

## 2018-03-15 DIAGNOSIS — R296 Repeated falls: Secondary | ICD-10-CM

## 2018-03-15 DIAGNOSIS — M542 Cervicalgia: Secondary | ICD-10-CM

## 2018-03-15 DIAGNOSIS — R41841 Cognitive communication deficit: Secondary | ICD-10-CM | POA: Diagnosis not present

## 2018-03-15 DIAGNOSIS — R2681 Unsteadiness on feet: Secondary | ICD-10-CM

## 2018-03-15 DIAGNOSIS — R42 Dizziness and giddiness: Secondary | ICD-10-CM

## 2018-03-15 NOTE — Patient Instructions (Signed)
Access Code: [email or web address]  URL: [email or web address]  Date: 03/01/2018  Prepared by: Geoffry Paradise   Exercises  Romberg Stance with Head Nods - 10 reps - 2-3 sets - 1x daily - 5x weekly  Romberg Stance Eyes Closed on Foam Pad - 1 reps - 3 sets - 10-30 SECONDS hold - 1x daily - 5x weekly  Backward Walking with Counter Support - 4 reps - 1 sets - 1x daily - 5x weekly  Walking with Head Rotation - 4 reps - 1 sets - 1x daily - 5x weekly  Seated Scapular Retraction - 10 reps - 3 sets - 1x daily - 3x weekly  Correct Seated Posture - 1x daily - 7x weekly  Supine Chin Tuck - 5 reps - 2 sets - 5 hold - 1x daily - 7x weekly  Standing on Foam Pad - 1 reps - 3 sets - 30 hold - 1x daily - 5x weekly  Seated Backward Shoulder Rolls - 10 reps - 2x daily - 7x weekly  Seated Upper Trapezius Stretch - 2 reps - 20-30 second hold - 2x daily - 7x weekly

## 2018-03-15 NOTE — Therapy (Signed)
[facility] [address] [place], [place], [address] Phone: [phone]   Fax:  [phone]  Physical Therapy Treatment  Patient Details  Name: [name] MRN: [id number] Date of Birth: 1970-09-10 Referring Provider (PT): Larey Seat, MD   Encounter Date: 03/15/2018  PT End of Session - 03/15/18 1226    Visit Number  12    Number of Visits  17    Date for PT Re-Evaluation  04/01/18    Authorization Type  $15 copay; [employer].  VL: follow Medicare guidelines.  10th visit PN    PT Start Time  0850    PT Stop Time  0932    PT Time Calculation (min)  42 min    Activity Tolerance  Patient tolerated treatment well    Behavior During Therapy  [facility] for tasks assessed/performed       Past Medical History:  Diagnosis Date  . Achilles tendinitis   . Achilles tendinitis   . ADHD (attention deficit hyperactivity disorder)   . Allergy   . Arthritis   . Bipolar affective ([place])   . Bipolar disorder ([place])   . Cataracts, bilateral   . Eczema   . Family history of breast cancer   . Family history of colon cancer   . Family history of genetic disease carrier   . Ganglion cyst 09/29/2009   left wrist (2 cyst)  . Hyperprolactinemia ([place])   . Hypertension   . Lipoma   . Migraine   . Personality disorder ([place])   . Pes planus     Past Surgical History:  Procedure Laterality Date  . ANKLE SURGERY  12/88   left   . chest nodule  1990?   rt chest wall nodule removal  . GANGLION CYST EXCISION  2011  . lipoma removal    . right bunioectomy    . SHOULDER SURGERY  01/13/2011   right, partial tear  . tumor resection left thigh      There were no vitals filed for this visit.  Subjective Assessment - 03/15/18 0852    Subjective  Foot is still doing great.  No longer wearing air cast.  No falls. Dizziness is still intermittent; some days it is fine and yesterday it was bad.    Pertinent History  OT for shoulder  pain, PT at [facility] for hip; multiple falls, achilles tendonitis, ADHD, OA, bipolar disorder, cataracts bilaterally, eczema, HTN, lipoma with removal, migraine, personality disorder, suicide ideation, L ankle surgery, R bunionectomy, R shoulder surgery and L thigh tumor resection    Patient Stated Goals  To be more steady and to improve neck ROM    Currently in Pain?  Yes    Pain Score  4     Pain Location  Shoulder    Pain Orientation  Right    Pain Descriptors / Indicators  Sore             Vestibular Assessment - 03/15/18 0903      Visual Acuity   Static  7    Dynamic  5   2 line difference, WFL              [facility] Adult PT Treatment/Exercise - 03/15/18 1222      Therapeutic Activites    Therapeutic Activities  Other Therapeutic Activities    Other Therapeutic Activities  Provided pt with printed exercise log from [facility] that allows her to check off to indicate each day the  exercises that she performs for PT.        Shoulder Exercises: Supine   Other Supine Exercises  Performed in supine: 8 reps simultaneous chin tuck with scapular retraction and depression with therapist providing manual facilitation for increased anterior chest stretch.        Neck Exercises: Stretches   Upper Trapezius Stretch  Right;Left;2 reps;60 seconds   passive in supine after TDN with manual shoulder depression      Trigger Point Dry Needling - 03/15/18 1225    Consent Given?  Yes    Muscles Treated Upper Body  Upper trapezius   right and left   Upper Trapezius Response  Twitch reponse elicited;Palpable increased muscle length           PT Education - 03/15/18 1225    Education provided  Yes    Education Details  exercise log for PT HEP    Person(s) Educated  Patient    Methods  Explanation    Comprehension  Verbalized understanding       PT Short Term Goals - 03/15/18 1227      PT SHORT TERM GOAL #1   Title  = LTG due 3/2        PT Long Term Goals -  03/15/18 1227      PT LONG TERM GOAL #1   Title  Pt will be independent with vestibular, balance, neck/posture HEP      Time  4    Period  Weeks    Status  Partially Met    Target Date  04/01/18      PT LONG TERM GOAL #2   Title  Pt will demonstrate 10-12 deg increase in cervical spine ROM and </= 2/10 pain with movement    Time  4    Period  Weeks    Status  Partially Met    Target Date  04/01/18      PT LONG TERM GOAL #3   Title  Pt will improve gait velocity to >/= 3.0 ft/sec with no evidence of imbalance or needing to touch walls/furniture    Status  Achieved      PT LONG TERM GOAL #4   Title  Pt will improve FGA to >/=25/30 to decrease falls risk during gait    Baseline  21/30 on 02/26/18    Time  4    Period  Weeks    Status  Revised    Target Date  04/01/18      PT LONG TERM GOAL #5   Title  To indicate improved use of VOR pt will tolerate performing VOR x1 viewing in standing x 60 seconds with wide and narrow BOS without UE support    Time  4    Period  Weeks    Status  New    Target Date  04/01/18            Plan - 03/15/18 1229    Clinical Impression Statement  Continued to re-assess pt's progress with re-asssessment of gaze stability with DVA.  Pt demonstrates significant improvement from 5 line difference down to 2 line difference which is [facility].  Continued to address increased tension and decreased ROM in cervical spine with trigger point dry needling and PROM to bilat upper trapezius muscles.  Pt tolerated well.  Will continue to address and progress towards LTG.    Rehab Potential  Good    PT Frequency  2x / week    PT Duration  4 weeks    PT Treatment/Interventions  ADLs/Self Care Home Management;Cryotherapy;Lobbyist;Therapeutic exercise;Therapeutic activities;Functional mobility training;Patient/family education;Manual techniques;[place] Repostioning;Moist Heat;Gait training;Neuromuscular re-education;Passive range of  motion;Dry needling;Vestibular    PT Next Visit Plan  Continue dry needling and stretching of L upper trap/rhomboids, postural exercises.  Give x1 viewing if it isn't on her HEP already.  Balance with eyes closed.      PT Home Exercise Plan  [email or web address]    Consulted and Agree with Plan of Care  Patient       Patient will benefit from skilled therapeutic intervention in order to improve the following deficits and impairments:  Decreased range of motion, Difficulty walking, Increased muscle spasms, Pain, Decreased balance, Impaired flexibility, Postural dysfunction, Decreased strength, Decreased mobility, Decreased cognition, Dizziness  Visit Diagnosis: Cervicalgia  Dizziness and giddiness  Unsteadiness on feet  Repeated falls     Problem List Patient Active Problem List   Diagnosis Date Noted  . 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 Genetic testing 02/27/2018  . Family history of genetic disease carrier   . Family history of breast cancer   . Family history of colon cancer   . Head injury with loss of consciousness ([place]) 11/20/2017  . Nausea 11/20/2017  . 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 Tremor observed on examination 11/20/2017  . Left shoulder pain 02/18/2017  . MDD (major depressive disorder), recurrent severe, without psychosis ([place]) 12/28/2016  . Muscle strain 09/19/2016  . Right foot injury, subsequent encounter 08/25/2016  . Low back pain 06/27/2016  . Left wrist injury, subsequent encounter 06/27/2016  . Pain of left thumb 10/07/2015  . 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 Strain of left thumb 02/11/2015  . Strain of right forearm 02/11/2015  . Right shoulder pain 12/31/2014  . Injury of right little finger 12/31/2014  . Episodic cluster headache, 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  . 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 Hypersomnia, recurrent 12/02/2014  . Migraine aura, persistent, intractable, with status migrainosus 12/02/2014  . Lower back injury 09/30/2014  . Bipolar I disorder, most recent episode depressed ([place])   . MDD (major depressive disorder), recurrent, severe, with psychosis ([employer]) 09/23/2014  . Injury of left index finger 08/20/2014  . Right ankle sprain 06/01/2014  . Contusion, multiple sites 06/01/2014  . Strain of right gastrocnemius muscle 06/01/2014  . Phonophobia 05/04/2014  . Photophobia of both eyes 05/04/2014  . Emotionally unstable borderline personality disorder ([place]) 05/04/2014  . Nausea with vomiting 05/04/2014  . Mixed bipolar I disorder ([place])   . Bipolar I disorder, most recent episode mixed ([place]) 04/18/2014  . Bipolar affective disorder, depressed, mild ([place]) 04/12/2014  . Suicidal ideation 04/12/2014  . Injury of right shoulder and upper arm 02/17/2014  . Left leg pain 06/03/2013  . Right hip pain 06/03/2013  . Migraine with status migrainosus 01/08/2013  . Personality disorder ([place])   . Chronic migraine 05/08/2012  . Contact dermatitis 11/27/2011  . Major depressive disorder, recurrent episode ([place]) 10/27/2011  . Generalized anxiety disorder 10/27/2011  . 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 Borderline personality disorder ([place]) 10/27/2011  . Right foot pain 09/28/2011  . 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 Ganglion cyst 09/29/2009  . PES PLANUS 07/01/2008  . BIPOLAR DISORDER UNSPECIFIED 06/09/2008  Rico Junker, PT, DPT 03/15/18    12:34 PM    [facility] [address] [place], [place], [address] Phone: [phone]   Fax:  [phone]  Name: [name] MRN: [id number] Date of Birth: January 24, 1971

## 2018-03-18 ENCOUNTER — Ambulatory Visit: Payer: Self-pay | Admitting: Family Medicine

## 2018-03-18 ENCOUNTER — Ambulatory Visit: Payer: PPO | Admitting: Physical Therapy

## 2018-03-18 ENCOUNTER — Encounter: Payer: Self-pay | Admitting: Physical Therapy

## 2018-03-18 DIAGNOSIS — R42 Dizziness and giddiness: Secondary | ICD-10-CM

## 2018-03-18 DIAGNOSIS — R296 Repeated falls: Secondary | ICD-10-CM

## 2018-03-18 DIAGNOSIS — R41841 Cognitive communication deficit: Secondary | ICD-10-CM | POA: Diagnosis not present

## 2018-03-18 DIAGNOSIS — R2681 Unsteadiness on feet: Secondary | ICD-10-CM

## 2018-03-18 DIAGNOSIS — M542 Cervicalgia: Secondary | ICD-10-CM

## 2018-03-18 NOTE — Patient Instructions (Addendum)
Over Head Pull: Narrow Grip    On back, knees bent, feet flat, band across thighs, elbows straight but relaxed. Pull hands apart (start). Keeping elbows straight, bring arms up and over head, hands toward floor. Keep pull steady on band. Hold momentarily. Return slowly, keeping pull steady, back to start. Repeat _10__ times. Band color __green____    Side Pull: Double Arm    On back, knees bent, feet flat. Arms perpendicular to body, shoulder level, elbows straight but relaxed. Pull arms out to sides, elbows straight. Resistance band comes across collarbones, hands toward floor. Hold momentarily. Slowly return to starting position. Repeat _10__ times. Band color __green__    Sash    On back, knees bent, feet flat, left hand on left hip, right hand above left. Pull right arm DIAGONALLY (hip to shoulder) across chest. Bring right arm along head toward floor. Hold momentarily. Slowly return to starting position. Repeat _10__ times. Do with left arm 10 times. Band color _green_____     Shoulder Rotation: Double Arm    On back, knees bent, feet flat, elbows tucked at sides, bent 90, hands palms up. Pull hands apart and down toward floor, keeping elbows near sides. Hold momentarily. Slowly return to starting position. Repeat _10__ times. Band color __Green____     Access Code: [email or web address]  URL: [email or web address]  Date: 03/18/2018  Prepared by: Misty Stanley   Exercises  Romberg Stance with Head Nods - 10 reps - 2-3 sets - 1x daily - 5x weekly  Romberg Stance Eyes Closed on Foam Pad - 1 reps - 3 sets - 10-30 SECONDS hold - 1x daily - 5x weekly  Backward Walking with Counter Support - 4 reps - 1 sets - 1x daily - 5x weekly  Walking with Head Rotation - 4 reps - 1 sets - 1x daily - 5x weekly  Correct Seated Posture - 1x daily - 7x weekly  Seated Backward Shoulder Rolls - 10 reps - 2x daily - 7x weekly  Seated Upper Trapezius Stretch - 2 reps - 20-30 second hold - 2x  daily - 7x weekly  Supine Chin Tuck - 5 reps - 2 sets - 5 hold - 1x daily - 7x weekly

## 2018-03-18 NOTE — Therapy (Signed)
[facility] [address] [place], [place], [address] Phone: [phone]   Fax:  [phone]  Physical Therapy Treatment  Patient Details  Name: [name] MRN: [id number] Date of Birth: 09/03/1970 Referring Provider (PT): Larey Seat, MD   Encounter Date: 03/18/2018  PT End of Session - 03/18/18 2115    Visit Number  13    Number of Visits  17    Date for PT Re-Evaluation  04/01/18    Authorization Type  $15 copay; [employer].  VL: follow Medicare guidelines.  10th visit PN    PT Start Time  [phone]    PT Stop Time  0933    PT Time Calculation (min)  42 min    Activity Tolerance  Patient tolerated treatment well    Behavior During Therapy  [facility] for tasks assessed/performed       Past Medical History:  Diagnosis Date  . Achilles tendinitis   . Achilles tendinitis   . ADHD (attention deficit hyperactivity disorder)   . Allergy   . Arthritis   . Bipolar affective ([place])   . Bipolar disorder ([place])   . Cataracts, bilateral   . Eczema   . Family history of breast cancer   . Family history of colon cancer   . Family history of genetic disease carrier   . Ganglion cyst 09/29/2009   left wrist (2 cyst)  . Hyperprolactinemia ([place])   . Hypertension   . Lipoma   . Migraine   . Personality disorder ([place])   . Pes planus     Past Surgical History:  Procedure Laterality Date  . ANKLE SURGERY  12/88   left   . chest nodule  1990?   rt chest wall nodule removal  . GANGLION CYST EXCISION  2011  . lipoma removal    . right bunioectomy    . SHOULDER SURGERY  01/13/2011   right, partial tear  . tumor resection left thigh      There were no vitals filed for this visit.  Subjective Assessment - 03/18/18 0854    Subjective  Pt had an eventful weekend.  While at work had a couple episodes of posterior LOB and had a couple more episodes of things falling out of her hands, which had been improving.  Also under  a lot of stress due to medication cost being increased.  Pt had bandages on bilat wrists; states it is a rash and is embarrassed to have it showing.  Reports she has a cream she has to put on the rash and keep it covered.    Pertinent History  OT for shoulder pain, PT at [facility] for hip; multiple falls, achilles tendonitis, ADHD, OA, bipolar disorder, cataracts bilaterally, eczema, HTN, lipoma with removal, migraine, personality disorder, suicide ideation, L ankle surgery, R bunionectomy, R shoulder surgery and L thigh tumor resection    Patient Stated Goals  To be more steady and to improve neck ROM    Currently in Pain?  Yes    Pain Score  1     Pain Location  Hand    Pain Orientation  Right;Left    Pain Descriptors / Indicators  Other (Comment)   itching                      [facility] Adult PT Treatment/Exercise - 03/18/18 0933      Therapeutic Activites    Therapeutic Activities  Other Therapeutic Activities  Other Therapeutic Activities  Assisted pt with redressing rash on bilat wrists, also to allow therapist to assess if pt had any suicide attempt.  No lacerations to the skin noted; pt does have a rash with mild blistering on bilat wrists.        Vestibular Treatment/Exercise - 03/18/18 2113      Vestibular Treatment/Exercise   Vestibular Treatment Provided  Gaze    Gaze Exercises  X1 Viewing Horizontal;X1 Viewing Vertical      X1 Viewing Horizontal   Foot Position  seated    Comments  30 seconds with pt holding card      X1 Viewing Vertical   Foot Position  seated    Comments  30 seconds with pt holding card           Over Head Pull: Narrow Grip    On back, knees bent, feet flat, band across thighs, elbows straight but relaxed. Pull hands apart (start). Keeping elbows straight, bring arms up and over head, hands toward floor. Keep pull steady on band. Hold momentarily. Return slowly, keeping pull steady, back to start. Repeat _10__ times. Band  color __green____    Side Pull: Double Arm    On back, knees bent, feet flat. Arms perpendicular to body, shoulder level, elbows straight but relaxed. Pull arms out to sides, elbows straight. Resistance band comes across collarbones, hands toward floor. Hold momentarily. Slowly return to starting position. Repeat _10__ times. Band color __green__    Sash    On back, knees bent, feet flat, left hand on left hip, right hand above left. Pull right arm DIAGONALLY (hip to shoulder) across chest. Bring right arm along head toward floor. Hold momentarily. Slowly return to starting position. Repeat _10__ times. Do with left arm 10 times. Band color _green_____     Shoulder Rotation: Double Arm    On back, knees bent, feet flat, elbows tucked at sides, bent 90, hands palms up. Pull hands apart and down toward floor, keeping elbows near sides. Hold momentarily. Slowly return to starting position. Repeat _10__ times. Band color __Green____   PT Education - 03/18/18 2114    Education provided  Yes    Education Details  added new postural exercises to HEP; returned x 1 viewing to HEP    Person(s) Educated  Patient    Methods  Explanation;Demonstration;Handout    Comprehension  Verbalized understanding;Returned demonstration       PT Short Term Goals - 03/15/18 1227      PT SHORT TERM GOAL #1   Title  = LTG due 3/2        PT Long Term Goals - 03/15/18 1227      PT LONG TERM GOAL #1   Title  Pt will be independent with vestibular, balance, neck/posture HEP      Time  4    Period  Weeks    Status  Partially Met    Target Date  04/01/18      PT LONG TERM GOAL #2   Title  Pt will demonstrate 10-12 deg increase in cervical spine ROM and </= 2/10 pain with movement    Time  4    Period  Weeks    Status  Partially Met    Target Date  04/01/18      PT LONG TERM GOAL #3   Title  Pt will improve gait velocity to >/= 3.0 ft/sec with no evidence of imbalance or needing to touch  walls/furniture  Status  Achieved      PT LONG TERM GOAL #4   Title  Pt will improve FGA to >/=25/30 to decrease falls risk during gait    Baseline  21/30 on 02/26/18    Time  4    Period  Weeks    Status  Revised    Target Date  04/01/18      PT LONG TERM GOAL #5   Title  To indicate improved use of VOR pt will tolerate performing VOR x1 viewing in standing x 60 seconds with wide and narrow BOS without UE support    Time  4    Period  Weeks    Status  New    Target Date  04/01/18            Plan - 03/18/18 0932    Clinical Impression Statement  Assisted pt with removing and redressing bilat wrists due to skin irritation from regular tape.  Pt did have a rash on bilat wrists and did not demonstrate signs of self-harm.  Treatment session today focused on addition of supine, resisted postural exercises to retrain postural muscles within new, available range.  Pt reported some mild pain in shoulders with overhead movement; advised pt to remain below 90 deg of flexion.  Also returned to performing x 1 viewing but pt required increased cues to perform correctly; will continue to review at next session.  Will continue to progress towards LTG.    Rehab Potential  Good    PT Frequency  2x / week    PT Duration  4 weeks    PT Treatment/Interventions  ADLs/Self Care Home Management;Cryotherapy;Lobbyist;Therapeutic exercise;Therapeutic activities;Functional mobility training;Patient/family education;Manual techniques;Canalith Repostioning;Moist Heat;Gait training;Neuromuscular re-education;Passive range of motion;Dry needling;Vestibular    PT Next Visit Plan  Review x 1 viewing in standing with letter taped to wall; review strategies grounding/balance exercises when in standing at working.  Continue dry needling and stretching of L upper trap/rhomboids, postural exercises.      PT Home Exercise Plan  [id number]    Consulted and Agree with Plan of Care  Patient         Patient will benefit from skilled therapeutic intervention in order to improve the following deficits and impairments:  Decreased range of motion, Difficulty walking, Increased muscle spasms, Pain, Decreased balance, Impaired flexibility, Postural dysfunction, Decreased strength, Decreased mobility, Decreased cognition, Dizziness  Visit Diagnosis: Cervicalgia  Dizziness and giddiness  Unsteadiness on feet  Repeated falls     Problem List Patient Active Problem List   Diagnosis Date Noted  . 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 Genetic testing 02/27/2018  . Family history of genetic disease carrier   . Family history of breast cancer   . Family history of colon cancer   . Head injury with loss of consciousness ([place]) 11/20/2017  . Nausea 11/20/2017  . 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 Tremor observed on examination 11/20/2017  . Left shoulder pain 02/18/2017  . MDD (major depressive disorder), recurrent severe, without psychosis ([place]) 12/28/2016  . Muscle strain 09/19/2016  . Right foot injury, subsequent encounter 08/25/2016  . Low back pain 06/27/2016  . Left wrist injury, subsequent encounter 06/27/2016  . Pain of left thumb 10/07/2015  . 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 Strain of left thumb 02/11/2015  . Strain of right forearm 02/11/2015  . Right shoulder pain  12/31/2014  . Injury of right little finger 12/31/2014  . Episodic cluster headache, 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  . 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 Hypersomnia, recurrent 12/02/2014  . Migraine aura, persistent, intractable, with status migrainosus 12/02/2014  . Lower back injury 09/30/2014  . Bipolar I disorder, most recent episode depressed ([place])   . MDD (major depressive disorder), recurrent, severe,  with psychosis ([place]) 09/23/2014  . Injury of left index finger 08/20/2014  . Right ankle sprain 06/01/2014  . Contusion, multiple sites 06/01/2014  . Strain of right gastrocnemius muscle 06/01/2014  . Phonophobia 05/04/2014  . Photophobia of both eyes 05/04/2014  . Emotionally unstable borderline personality disorder ([place]) 05/04/2014  . Nausea with vomiting 05/04/2014  . Mixed bipolar I disorder ([place])   . Bipolar I disorder, most recent episode mixed ([place]) 04/18/2014  . Bipolar affective disorder, depressed, mild ([place]) 04/12/2014  . Suicidal ideation 04/12/2014  . Injury of right shoulder and upper arm 02/17/2014  . Left leg pain 06/03/2013  . Right hip pain 06/03/2013  . Migraine with status migrainosus 01/08/2013  . Personality disorder ([place])   . Chronic migraine 05/08/2012  . Contact dermatitis 11/27/2011  . Major depressive disorder, recurrent episode ([place]) 10/27/2011  . Generalized anxiety disorder 10/27/2011  . 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 Borderline personality disorder ([place]) 10/27/2011  . Right foot pain 09/28/2011  . 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 Ganglion cyst 09/29/2009  . PES PLANUS 07/01/2008  . BIPOLAR DISORDER UNSPECIFIED 06/09/2008    Rico Junker, PT, DPT 03/18/18    9:23 PM    [facility] [address] [place], [place], [address] Phone: [phone]   Fax:  [phone]  Name: [name] MRN: [id number] Date of Birth: [birth year]

## 2018-03-19 ENCOUNTER — Ambulatory Visit: Payer: PPO | Admitting: Speech Pathology

## 2018-03-19 DIAGNOSIS — R41841 Cognitive communication deficit: Secondary | ICD-10-CM

## 2018-03-19 DIAGNOSIS — R4701 Aphasia: Secondary | ICD-10-CM

## 2018-03-19 DIAGNOSIS — L308 Other specified dermatitis: Secondary | ICD-10-CM | POA: Diagnosis not present

## 2018-03-19 DIAGNOSIS — L309 Dermatitis, unspecified: Secondary | ICD-10-CM | POA: Diagnosis not present

## 2018-03-19 NOTE — Patient Instructions (Signed)
   [email or web address]  Smitty Pluck - TBI coach - videos on [employer]  Lower the light on the computer in the settings  Reading Focus cards - [email or web address]

## 2018-03-21 ENCOUNTER — Encounter: Payer: Self-pay | Admitting: Speech Pathology

## 2018-03-21 ENCOUNTER — Ambulatory Visit: Payer: PPO | Admitting: Speech Pathology

## 2018-03-21 DIAGNOSIS — R4701 Aphasia: Secondary | ICD-10-CM

## 2018-03-21 DIAGNOSIS — R41841 Cognitive communication deficit: Secondary | ICD-10-CM | POA: Diagnosis not present

## 2018-03-21 NOTE — Patient Instructions (Addendum)
  Brain fatigue/fog can happen when you are hyper-stimulated   Brain break 15 to 20 minutes with 4 4 6  breathing  Educate people that you have had a concussion and are having some difficulty and need more time to process your speech/language  Repeat back what you have heard to help your brain process what you have heard "Let me make sure I got that...."  People expect a response in 1 second, when they don't get it, they have to fill in the silence or they feel uncomfortable and react mean or rude  It may help if you let the person know you need an extra minute  These differences are subtle but significant to the general public "neurotypical" population  You look good on the outside, no one can see your injured brain

## 2018-03-21 NOTE — Therapy (Signed)
[facility] [address] [place], [place], [address] Phone: [phone]   Fax:  [phone]  Speech Language Pathology Treatment  Patient Details  Name: [name] MRN: [id number] Date of Birth: April 14, 1970 Referring Provider (SLP): Seymour Bars, NP   Encounter Date: 03/21/2018  End of Session - 03/21/18 1532    Visit Number  13    Number of Visits  17    Date for SLP Re-Evaluation  04/19/18    SLP Start Time  0855    SLP Stop Time   0930    SLP Time Calculation (min)  35 min    Activity Tolerance  Patient tolerated treatment well       Past Medical History:  Diagnosis Date  . Achilles tendinitis   . Achilles tendinitis   . ADHD (attention deficit hyperactivity disorder)   . Allergy   . Arthritis   . Bipolar affective ([place])   . Bipolar disorder ([place])   . Cataracts, bilateral   . Eczema   . Family history of breast cancer   . Family history of colon cancer   . Family history of genetic disease carrier   . Ganglion cyst 09/29/2009   left wrist (2 cyst)  . Hyperprolactinemia ([place])   . Hypertension   . Lipoma   . Migraine   . Personality disorder ([place])   . Pes planus     Past Surgical History:  Procedure Laterality Date  . ANKLE SURGERY  12/88   left   . chest nodule  1990?   rt chest wall nodule removal  . GANGLION CYST EXCISION  2011  . lipoma removal    . right bunioectomy    . SHOULDER SURGERY  01/13/2011   right, partial tear  . tumor resection left thigh      There were no vitals filed for this visit.  Subjective Assessment - 03/21/18 1523    Subjective  "My mom really liked you"    Currently in Pain?  No/denies            ADULT SLP TREATMENT - 03/21/18 1524      General Information   Behavior/Cognition  Alert;Cooperative      Cognitive-Linquistic Treatment   Treatment focused on  Cognition    Skilled Treatment  Pt reports following up on reducing brightness on her  screen in her computer. She got a mini notebook and reports her mother has reminded her to use it and to re read through our sessions notes. Pt continues to loose attention during her discourse due to anomia as well as distractions. 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 We generated a script for her to quickly educate others re: her concussion and slower processing. We also generated the strategy to repeat what she heard to verify that she attended to and processed the entire message. Pt is to practice this at her work this week.       Progression Toward Goals   Progression toward goals  Progressing toward goals       SLP Education - 03/21/18 1528    Education Details  compensations for word finding and processing conversations    Person(s) Educated  Patient    Methods  Explanation;Demonstration;Verbal cues;Handout    Comprehension  Verbalized understanding;Returned demonstration       SLP Short Term Goals - 03/21/18 1531      SLP SHORT TERM GOAL #1   Title  pt will complete  cognitive linguistic testing in the first 2 therapy sessions    Status  Achieved      SLP SHORT TERM GOAL #2   Title  pt will name average 14 items in common categories in 60 seconds    Time  1    Period  Weeks    Status  On-going      SLP SHORT TERM GOAL #3   Title  pt will successfully use at least 2 compensatory strategies for anomia multiple times in 10 mintues of conversation over two sessions    Baseline  02-05-18; 03/05/18    Time  1    Status  Achieved      SLP SHORT TERM GOAL #4   Title  pt will demo organization/executive function skills adequate for simple functional tasks with rare min A    Time  1    Period  Weeks    Status  Achieved      SLP SHORT TERM GOAL #5   Title  pt will report 2 functional memory compensations she uses between 3 sessions    Baseline  02-11-18, 02-15-18,    Time  1    Period  Weeks    Status  On-going      SLP SHORT TERM GOAL #6   Title   to improve pt's attention skills, pt will use compensations for attention in 2 sessions    Baseline  02-11-18; 03/12/18    Time  2    Period  Weeks    Status  On-going       SLP Long Term Goals - 03/21/18 1531      SLP LONG TERM GOAL #1   Title  pt will improve her Boston Naming Test score to at least 51/60 on her last day of therapy    Time  3    Period  Weeks   17 sessions, for all LTGs   Status  On-going      SLP LONG TERM GOAL #2   Title  pt will use anomia compensations successfully in 15 minutes mod complex conversation over 3 therapy sessions    Baseline  02-15-18    Time  3    Period  Weeks    Status  On-going      SLP LONG TERM GOAL #3   Title  pt will report successful usage of memory compensations between 3 sessions    Baseline  03/14/18; 03/19/18    Time  3    Period  Weeks    Status  On-going      SLP LONG TERM GOAL #4   Title  pt will perform executive function pertinent for everyday cognitive-linguistic tasks with modified independence in 3 sessions    Baseline  trip planning; itinerary; monthly budget    Time  3    Period  Weeks    Status  On-going      SLP LONG TERM GOAL #5   Title  pt will demo functional divided attention skills with modified independence within 3 ST sessions    Time  4    Period  Weeks    Status  Achieved       Plan - 03/21/18 1529    Clinical Impression Statement  Ongoing attention, memory and processing compensations due to cognitive impairment. Targeted compensations for attending to and processing discourse at work. See skilled intervention. Continue skilled ST to maximize cognition and word finding for success at work and high level ADL's  Speech Therapy Frequency  2x / week    Duration  --   8 weeks or 17 visits   Treatment/Interventions  Language facilitation;Environmental controls;Cueing hierarchy;SLP instruction and feedback;Compensatory strategies;Cognitive reorganization;Functional tasks;Internal/external aids;Multimodal  communcation approach;Patient/family education    Potential to Achieve Goals  Good       Patient will benefit from skilled therapeutic intervention in order to improve the following deficits and impairments:   Cognitive communication deficit  Aphasia    Problem List Patient Active Problem List   Diagnosis Date Noted  . 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 Genetic testing 02/27/2018  . Family history of genetic disease carrier   . Family history of breast cancer   . Family history of colon cancer   . Head injury with loss of consciousness ([place]) 11/20/2017  . Nausea 11/20/2017  . 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 Tremor observed on examination 11/20/2017  . Left shoulder pain 02/18/2017  . MDD (major depressive disorder), recurrent severe, without psychosis ([place]) 12/28/2016  . Muscle strain 09/19/2016  . Right foot injury, subsequent encounter 08/25/2016  . Low back pain 06/27/2016  . Left wrist injury, subsequent encounter 06/27/2016  . Pain of left thumb 10/07/2015  . 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 Strain of left thumb 02/11/2015  . Strain of right forearm 02/11/2015  . Right shoulder pain 12/31/2014  . Injury of right little finger 12/31/2014  . Episodic cluster headache, 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  . 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 Hypersomnia, recurrent 12/02/2014  . Migraine aura, persistent, intractable, with status migrainosus 12/02/2014  . Lower back injury 09/30/2014  . Bipolar I disorder, most recent episode depressed ([place])   . MDD (major depressive disorder), recurrent, severe, with psychosis ([place]) 09/23/2014  . Injury of left index finger 08/20/2014  . Right ankle sprain 06/01/2014  . Contusion, multiple sites 06/01/2014  . Strain of right gastrocnemius  muscle 06/01/2014  . Phonophobia 05/04/2014  . Photophobia of both eyes 05/04/2014  . Emotionally unstable borderline personality disorder ([place]) 05/04/2014  . Nausea with vomiting 05/04/2014  . Mixed bipolar I disorder ([place])   . Bipolar I disorder, most recent episode mixed ([place]) 04/18/2014  . Bipolar affective disorder, depressed, mild ([place]) 04/12/2014  . Suicidal ideation 04/12/2014  . Injury of right shoulder and upper arm 02/17/2014  . Left leg pain 06/03/2013  . Right hip pain 06/03/2013  . Migraine with status migrainosus 01/08/2013  . Personality disorder ([place])   . Chronic migraine 05/08/2012  . Contact dermatitis 11/27/2011  . Major depressive disorder, recurrent episode ([place]) 10/27/2011  . Generalized anxiety disorder 10/27/2011  . 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 Borderline personality disorder ([place]) 10/27/2011  . Right foot pain 09/28/2011  . 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) 09/02/2010  . Ganglion cyst 09/29/2009  . PES PLANUS 07/01/2008  . BIPOLAR DISORDER UNSPECIFIED 06/09/2008    , Annye Rusk MS, CCC-SLP 03/21/2018, 3:33 PM  [facility] [address] [place], [place], [address] Phone: [phone]   Fax:  [phone]   Name: [name] MRN: [id number] Date of Birth: 1970/02/05

## 2018-03-21 NOTE — Therapy (Signed)
[facility] [address] [place], [place], [address] Phone: [phone]   Fax:  [phone]  Speech Language Pathology Treatment  Patient Details  Name: [name] MRN: [id number] Date of Birth: [birth year] Referring Provider (SLP): Seymour Bars, NP   Encounter Date: 03/19/2018  End of Session - 03/21/18 1503    Visit Number  12    Number of Visits  17    Date for SLP Re-Evaluation  04/19/18    SLP Start Time  1315    SLP Stop Time   1358    SLP Time Calculation (min)  43 min    Activity Tolerance  Patient tolerated treatment well       Past Medical History:  Diagnosis Date  . Achilles tendinitis   . Achilles tendinitis   . ADHD (attention deficit hyperactivity disorder)   . Allergy   . Arthritis   . Bipolar affective ([place])   . Bipolar disorder ([place])   . Cataracts, bilateral   . Eczema   . Family history of breast cancer   . Family history of colon cancer   . Family history of genetic disease carrier   . Ganglion cyst 09/29/2009   left wrist (2 cyst)  . Hyperprolactinemia ([place])   . Hypertension   . Lipoma   . Migraine   . Personality disorder ([place])   . Pes planus     Past Surgical History:  Procedure Laterality Date  . ANKLE SURGERY  12/88   left   . chest nodule  1990?   rt chest wall nodule removal  . GANGLION CYST EXCISION  2011  . lipoma removal    . right bunioectomy    . SHOULDER SURGERY  01/13/2011   right, partial tear  . tumor resection left thigh      There were no vitals filed for this visit.  Subjective Assessment - 03/21/18 1457    Subjective  "We went out shopping yesterday and I got so tired I had to take a nap"    Patient is accompained by:  Family member   pt's mom   Currently in Pain?  No/denies            ADULT SLP TREATMENT - 03/21/18 1458      General Information   Behavior/Cognition  Alert;Cooperative      Treatment Provided   Treatment provided   Cognitive-Linquistic      Cognitive-Linquistic Treatment   Treatment focused on  Cognition    Skilled Treatment  Pt's mother accompanies pt today for education and training. Mother trained in cueing pt to use compensations for memory and attention as pt requiring A at home to carryover compensatory strategies generated in ST sessions. Mom does report that pt is processing slower and forgetting more since her mBI. Mom educated re: sensory hypersensitivity and how this can affect [name]'s attention, memory and processing speed and thought organization      Progression Toward Goals   Progression toward goals  Progressing toward goals       SLP Education - 03/21/18 1501    Education Details  mom trained in cues to carryover strategies at home; sensory hypresensitivity    Person(s) Educated  Patient;Parent(s)    Methods  Explanation;Demonstration;Verbal cues;Handout    Comprehension  Verbalized understanding;Returned demonstration;Verbal cues required       SLP Short Term Goals - 03/21/18 1502      SLP SHORT TERM GOAL #1   Title  pt  will complete cognitive linguistic testing in the first 2 therapy sessions    Status  Achieved      SLP SHORT TERM GOAL #2   Title  pt will name average 14 items in common categories in 60 seconds    Time  1    Period  Weeks    Status  On-going      SLP SHORT TERM GOAL #3   Title  pt will successfully use at least 2 compensatory strategies for anomia multiple times in 10 mintues of conversation over two sessions    Baseline  02-05-18; 03/05/18    Time  1    Status  Achieved      SLP SHORT TERM GOAL #4   Title  pt will demo organization/executive function skills adequate for simple functional tasks with rare min A    Time  1    Period  Weeks    Status  Achieved      SLP SHORT TERM GOAL #5   Title  pt will report 2 functional memory compensations she uses between 3 sessions    Baseline  02-11-18, 02-15-18,    Time  1    Period  Weeks    Status  On-going       SLP SHORT TERM GOAL #6   Title  to improve pt's attention skills, pt will use compensations for attention in 2 sessions    Baseline  02-11-18; 03/12/18    Time  2    Period  Weeks    Status  On-going       SLP Long Term Goals - 03/21/18 1503      SLP LONG TERM GOAL #1   Title  pt will improve her [place] Naming Test score to at least 51/60 on her last day of therapy    Time  4    Period  Weeks   17 sessions, for all LTGs   Status  On-going      SLP LONG TERM GOAL #2   Title  pt will use anomia compensations successfully in 15 minutes mod complex conversation over 3 therapy sessions    Baseline  02-15-18    Time  3    Period  Weeks    Status  On-going      SLP LONG TERM GOAL #3   Title  pt will report successful usage of memory compensations between 3 sessions    Baseline  03/14/18; 03/19/18    Time  4    Period  Weeks    Status  On-going      SLP LONG TERM GOAL #4   Title  pt will perform executive function pertinent for everyday cognitive-linguistic tasks with modified independence in 3 sessions    Baseline  trip planning; itinerary; monthly budget    Time  3    Period  Weeks    Status  On-going      SLP LONG TERM GOAL #5   Title  pt will demo functional divided attention skills with modified independence within 3 ST sessions    Time  4    Period  Weeks    Status  Achieved       Plan - 03/21/18 1502    Clinical Impression Statement  Pt reports improvement of remembering to clock out on time and ask for emails at work. She continues to report and demonstrate reduced carryover of external aids at work, provided them today for her. Pt did write down a  question she had for me in her memory notebook. Continue skilled ST to maximize cognition and word finding for high level ADL's.    Speech Therapy Frequency  2x / week    Duration  --   8 weeks or 17 visits   Treatment/Interventions  Language facilitation;Environmental controls;Cueing hierarchy;SLP instruction and  feedback;Compensatory strategies;Cognitive reorganization;Functional tasks;Internal/external aids;Multimodal communcation approach;Patient/family education    Potential to Achieve Goals  Good    Consulted and Agree with Plan of Care  Patient       Patient will benefit from skilled therapeutic intervention in order to improve the following deficits and impairments:   Cognitive communication deficit  Aphasia    Problem List Patient Active Problem List   Diagnosis Date Noted  . 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 Genetic testing 02/27/2018  . Family history of genetic disease carrier   . Family history of breast cancer   . Family history of colon cancer   . Head injury with loss of consciousness ([place]) 11/20/2017  . Nausea 11/20/2017  . 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 Tremor observed on examination 11/20/2017  . Left shoulder pain 02/18/2017  . MDD (major depressive disorder), recurrent severe, without psychosis ([place]) 12/28/2016  . Muscle strain 09/19/2016  . Right foot injury, subsequent encounter 08/25/2016  . Low back pain 06/27/2016  . Left wrist injury, subsequent encounter 06/27/2016  . Pain of left thumb 10/07/2015  . 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 Strain of left thumb 02/11/2015  . Strain of right forearm 02/11/2015  . Right shoulder pain 12/31/2014  . Injury of right little finger 12/31/2014  . Episodic cluster headache, 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  . 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 Hypersomnia, recurrent 12/02/2014  . Migraine aura, persistent, intractable, with status migrainosus 12/02/2014  . Lower back injury 09/30/2014  . Bipolar I disorder, most recent episode depressed ([place])   . MDD (major depressive disorder), recurrent, severe, with psychosis ([place])  09/23/2014  . Injury of left index finger 08/20/2014  . Right ankle sprain 06/01/2014  . Contusion, multiple sites 06/01/2014  . Strain of right gastrocnemius muscle 06/01/2014  . Phonophobia 05/04/2014  . Photophobia of both eyes 05/04/2014  . Emotionally unstable borderline personality disorder ([place]) 05/04/2014  . Nausea with vomiting 05/04/2014  . Mixed bipolar I disorder ([place])   . Bipolar I disorder, most recent episode mixed ([place]) 04/18/2014  . Bipolar affective disorder, depressed, mild ([place]) 04/12/2014  . Suicidal ideation 04/12/2014  . Injury of right shoulder and upper arm 02/17/2014  . Left leg pain 06/03/2013  . Right hip pain 06/03/2013  . Migraine with status migrainosus 01/08/2013  . Personality disorder ([place])   . Chronic migraine 05/08/2012  . Contact dermatitis 11/27/2011  . Major depressive disorder, recurrent episode ([place]) 10/27/2011  . Generalized anxiety disorder 10/27/2011  . 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 Borderline personality disorder ([place]) 10/27/2011  . Right foot pain 09/28/2011  . 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 Ganglion cyst 09/29/2009  . PES PLANUS 07/01/2008  . BIPOLAR DISORDER UNSPECIFIED 06/09/2008    Lovvorn, Annye Rusk MS, CCC-SLP 03/21/2018, 3:06 PM  [facility] [address] [place], [place], [address] Phone: [phone]   Fax:  [phone]  Name: [name] MRN: [id number] Date of Birth: 19-May-1970

## 2018-03-22 ENCOUNTER — Ambulatory Visit: Payer: PPO | Admitting: Physical Therapy

## 2018-03-25 ENCOUNTER — Ambulatory Visit: Payer: PPO | Admitting: Physical Therapy

## 2018-03-26 ENCOUNTER — Ambulatory Visit: Payer: PPO | Admitting: Speech Pathology

## 2018-03-26 ENCOUNTER — Encounter: Payer: Self-pay | Admitting: Speech Pathology

## 2018-03-26 DIAGNOSIS — R41841 Cognitive communication deficit: Secondary | ICD-10-CM | POA: Diagnosis not present

## 2018-03-26 NOTE — Patient Instructions (Signed)
  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

## 2018-03-26 NOTE — Therapy (Signed)
[facility] [address] [place], [place], [address] Phone: [phone]   Fax:  [phone]  Speech Language Pathology Treatment  Patient Details  Name: [name] MRN: [id number] Date of Birth: 12/26/1970 Referring Provider (SLP): Seymour Bars, NP   Encounter Date: 03/26/2018  End of Session - 03/26/18 1700    Visit Number  14    Number of Visits  17    Date for SLP Re-Evaluation  04/19/18    SLP Start Time  0930    SLP Stop Time   1013    SLP Time Calculation (min)  43 min    Activity Tolerance  Patient tolerated treatment well       Past Medical History:  Diagnosis Date  . Achilles tendinitis   . Achilles tendinitis   . ADHD (attention deficit hyperactivity disorder)   . Allergy   . Arthritis   . Bipolar affective ([place])   . Bipolar disorder ([place])   . Cataracts, bilateral   . Eczema   . Family history of breast cancer   . Family history of colon cancer   . Family history of genetic disease carrier   . Ganglion cyst 09/29/2009   left wrist (2 cyst)  . Hyperprolactinemia ([place])   . Hypertension   . Lipoma   . Migraine   . Personality disorder ([place])   . Pes planus     Past Surgical History:  Procedure Laterality Date  . ANKLE SURGERY  12/88   left   . chest nodule  1990?   rt chest wall nodule removal  . GANGLION CYST EXCISION  2011  . lipoma removal    . right bunioectomy    . SHOULDER SURGERY  01/13/2011   right, partial tear  . tumor resection left thigh      There were no vitals filed for this visit.  Subjective Assessment - 03/26/18 1023    Subjective  "Not so great"     Currently in Pain?  Yes    Pain Score  4     Pain Location  Hand    Pain Orientation  Left    Pain Descriptors / Indicators  Aching   itching   Pain Type  Chronic pain    Pain Onset  More than a month ago    Pain Frequency  Intermittent            ADULT SLP TREATMENT - 03/26/18 1027      General Information   Behavior/Cognition  Alert;Cooperative      Treatment Provided   Treatment provided  Cognitive-Linquistic      Cognitive-Linquistic Treatment   Treatment focused on  Cognition    Skilled Treatment  Pt states she is having trouble comprehending and entering e mails at work, processing rapidly this information - we collaborated strategies of having customers write it down, request they spell it slowly and she repeats it back. [name] brought in her mini notebook and has utilized this to write down to do list and has checked off items she has completed.       Assessment / Recommendations / Plan   Plan  Continue with current plan of care      Progression Toward Goals   Progression toward goals  Progressing toward goals       SLP Education - 03/26/18 1052    Education Details  compensation for slow processing of language    Person(s) Educated  Patient  Methods  Explanation;Verbal cues    Comprehension  Verbalized understanding;Returned demonstration;Verbal cues required       SLP Short Term Goals - 03/26/18 1659      SLP SHORT TERM GOAL #1   Title  pt will complete cognitive linguistic testing in the first 2 therapy sessions    Status  Achieved      SLP SHORT TERM GOAL #2   Title  pt will name average 14 items in common categories in 60 seconds    Time  1    Period  Weeks    Status  On-going      SLP SHORT TERM GOAL #3   Title  pt will successfully use at least 2 compensatory strategies for anomia multiple times in 10 mintues of conversation over two sessions    Baseline  02-05-18; 03/05/18    Time  1    Status  Achieved      SLP SHORT TERM GOAL #4   Title  pt will demo organization/executive function skills adequate for simple functional tasks with rare min A    Time  1    Period  Weeks    Status  Achieved      SLP SHORT TERM GOAL #5   Title  pt will report 2 functional memory compensations she uses between 3 sessions    Baseline  02-11-18, 02-15-18,     Time  1    Period  Weeks    Status  On-going      SLP SHORT TERM GOAL #6   Title  to improve pt's attention skills, pt will use compensations for attention in 2 sessions    Baseline  02-11-18; 03/12/18    Time  2    Period  Weeks    Status  Achieved       SLP Long Term Goals - 03/26/18 1659      SLP LONG TERM GOAL #1   Title  pt will improve her Boston Naming Test score to at least 51/60 on her last day of therapy    Time  3    Period  Weeks   17 sessions, for all LTGs   Status  On-going      SLP LONG TERM GOAL #2   Title  pt will use anomia compensations successfully in 15 minutes mod complex conversation over 3 therapy sessions    Baseline  02-15-18;     Time  3    Period  Weeks    Status  On-going      SLP LONG TERM GOAL #3   Title  pt will report successful usage of memory compensations between 3 sessions    Baseline  03/14/18; 03/19/18; 03/26/18    Time  3    Period  Weeks    Status  On-going      SLP LONG TERM GOAL #4   Title  pt will perform executive function pertinent for everyday cognitive-linguistic tasks with modified independence in 3 sessions    Baseline  trip planning; itinerary; monthly budget    Time  3    Period  Weeks    Status  On-going      SLP LONG TERM GOAL #5   Title  pt will demo functional divided attention skills with modified independence within 3 ST sessions    Time  4    Period  Weeks    Status  Achieved       Plan - 03/26/18 1053  Clinical Impression Statement  [name] has demonstrated execellent carryover of strategies to help her compensate for attention, memory,, processing. She is using a Social worker, organizing her finances/budget,  and using external aids at work to remind her to complete tasks at work. Continue skilled ST 1 more visit to maximize carryover of compensations for congitive impairment for success at work and high level ADL's    Speech Therapy Frequency  2x / week    Duration  --   8 weeks or 17 visits    Treatment/Interventions  Language facilitation;Environmental controls;Cueing hierarchy;SLP instruction and feedback;Compensatory strategies;Cognitive reorganization;Functional tasks;Internal/external aids;Multimodal communcation approach;Patient/family education       Patient will benefit from skilled therapeutic intervention in order to improve the following deficits and impairments:   Cognitive communication deficit    Problem List Patient Active Problem List   Diagnosis Date Noted  . 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 Genetic testing 02/27/2018  . Family history of genetic disease carrier   . Family history of breast cancer   . Family history of colon cancer   . Head injury with loss of consciousness ([place]) 11/20/2017  . Nausea 11/20/2017  . 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 Tremor observed on examination 11/20/2017  . Left shoulder pain 02/18/2017  . MDD (major depressive disorder), recurrent severe, without psychosis ([place]) 12/28/2016  . Muscle strain 09/19/2016  . Right foot injury, subsequent encounter 08/25/2016  . Low back pain 06/27/2016  . Left wrist injury, subsequent encounter 06/27/2016  . Pain of left thumb 10/07/2015  . 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 Strain of left thumb 02/11/2015  . Strain of right forearm 02/11/2015  . Right shoulder pain 12/31/2014  . Injury of right little finger 12/31/2014  . Episodic cluster headache, 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  . 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 Hypersomnia, recurrent 12/02/2014  . Migraine aura, persistent, intractable, with status migrainosus 12/02/2014  . Lower back injury 09/30/2014  . Bipolar I disorder, most recent episode depressed ([place])   . MDD (major depressive disorder), recurrent, severe, with psychosis  ([place]) 09/23/2014  . Injury of left index finger 08/20/2014  . Right ankle sprain 06/01/2014  . Contusion, multiple sites 06/01/2014  . Strain of right gastrocnemius muscle 06/01/2014  . Phonophobia 05/04/2014  . Photophobia of both eyes 05/04/2014  . Emotionally unstable borderline personality disorder ([place]) 05/04/2014  . Nausea with vomiting 05/04/2014  . Mixed bipolar I disorder ([place])   . Bipolar I disorder, most recent episode mixed ([place]) 04/18/2014  . Bipolar affective disorder, depressed, mild ([place]) 04/12/2014  . Suicidal ideation 04/12/2014  . Injury of right shoulder and upper arm 02/17/2014  . Left leg pain 06/03/2013  . Right hip pain 06/03/2013  . Migraine with status migrainosus 01/08/2013  . Personality disorder ([place])   . Chronic migraine 05/08/2012  . Contact dermatitis 11/27/2011  . Major depressive disorder, recurrent episode ([place]) 10/27/2011  . Generalized anxiety disorder 10/27/2011  . 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 Borderline personality disorder ([place]) 10/27/2011  . Right foot pain 09/28/2011  . 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 Ganglion cyst 09/29/2009  . PES PLANUS 07/01/2008  . BIPOLAR DISORDER UNSPECIFIED 06/09/2008    Iylah Dworkin, Annye Rusk MS, CCC-SLP 03/26/2018, 5:01 PM  [facility]  [facility] [address] [place], [place], [address] Phone: [phone]   Fax:  [phone]   Name: SHANEQUIA KENDRICK MRN: [id number] Date of Birth: 03-Jun-1970

## 2018-03-29 ENCOUNTER — Encounter: Payer: Self-pay | Admitting: Physical Therapy

## 2018-03-29 ENCOUNTER — Ambulatory Visit: Payer: PPO | Admitting: Physical Therapy

## 2018-03-29 DIAGNOSIS — R2681 Unsteadiness on feet: Secondary | ICD-10-CM

## 2018-03-29 DIAGNOSIS — R293 Abnormal posture: Secondary | ICD-10-CM

## 2018-03-29 DIAGNOSIS — R42 Dizziness and giddiness: Secondary | ICD-10-CM

## 2018-03-29 DIAGNOSIS — R41841 Cognitive communication deficit: Secondary | ICD-10-CM | POA: Diagnosis not present

## 2018-03-29 DIAGNOSIS — M542 Cervicalgia: Secondary | ICD-10-CM

## 2018-03-29 DIAGNOSIS — M6281 Muscle weakness (generalized): Secondary | ICD-10-CM

## 2018-03-29 DIAGNOSIS — R296 Repeated falls: Secondary | ICD-10-CM

## 2018-03-29 NOTE — Therapy (Signed)
[facility] [address] [place], [place], [address] Phone: [phone]   Fax:  [phone]  Physical Therapy Treatment  Patient Details  Name: [name] MRN: [id number] Date of Birth: 30-Dec-1970 Referring Provider (PT): Larey Seat, MD   Encounter Date: 03/29/2018  PT End of Session - 03/29/18 1015    Visit Number  14    Number of Visits  22    Date for PT Re-Evaluation  05/01/18    Authorization Type  $15 copay; [employer].  VL: follow Medicare guidelines.  10th visit PN    PT Start Time  0930    PT Stop Time  1018    PT Time Calculation (min)  48 min    Activity Tolerance  Patient tolerated treatment well    Behavior During Therapy  WFL for tasks assessed/performed       Past Medical History:  Diagnosis Date  . Achilles tendinitis   . Achilles tendinitis   . ADHD (attention deficit hyperactivity disorder)   . Allergy   . Arthritis   . Bipolar affective ([place])   . Bipolar disorder ([place])   . Cataracts, bilateral   . Eczema   . Family history of breast cancer   . Family history of colon cancer   . Family history of genetic disease carrier   . Ganglion cyst 09/29/2009   left wrist (2 cyst)  . Hyperprolactinemia ([place])   . Hypertension   . Lipoma   . Migraine   . Personality disorder ([place])   . Pes planus     Past Surgical History:  Procedure Laterality Date  . ANKLE SURGERY  12/88   left   . chest nodule  1990?   rt chest wall nodule removal  . GANGLION CYST EXCISION  2011  . lipoma removal    . right bunioectomy    . SHOULDER SURGERY  01/13/2011   right, partial tear  . tumor resection left thigh      There were no vitals filed for this visit.  Subjective Assessment - 03/29/18 0935    Subjective  Pt still having issues with items falling out of her hand.  Had another busy work week but will be working less next week.  Rash on wrist is better, dermatologist took a biopsy but no  results.  No dizziness today but did have some this week.      Pertinent History  OT for shoulder pain, PT at [facility] for hip; multiple falls, achilles tendonitis, ADHD, OA, bipolar disorder, cataracts bilaterally, eczema, HTN, lipoma with removal, migraine, personality disorder, suicide ideation, L ankle surgery, R bunionectomy, R shoulder surgery and L thigh tumor resection    Patient Stated Goals  To be more steady and to improve neck ROM    Currently in Pain?  No/denies         [facility] PT Assessment - 03/29/18 0941      Assessment   Medical Diagnosis  Head Injury s/p fall    Referring Provider (PT)  Larey Seat, MD    Onset Date/Surgical Date  11/20/17    Hand Dominance  Right    Prior Therapy  yes outpatient OT for shoulder and PT for hip pain      Precautions   Precautions  Other (comment)    Precaution Comments  achilles tendonitis, ADHD, OA, bipolar disorder, cataracts bilaterally, eczema, HTN, lipoma with removal, migraine, personality disorder, suicide ideation, L ankle surgery, R bunionectomy, R shoulder  surgery and L thigh tumor resection      [place] residence      Prior Function   Level of Independence  Independent    Vocation  Part time employment    Vocation Requirements  [employer]: working at [employer]    Leisure  no exercise      Observation/Other Assessments   Focus on Therapeutic Outcomes (FOTO)   Not assessed      Sensation   Light Touch  Appears Intact    Hot/Cold  Appears Intact      Coordination   Fine Motor Movements are Fluid and Coordinated  No      ROM / Strength   AROM / PROM / Strength  Strength      AROM   Overall AROM   Deficits    AROM Assessment Site  Cervical    Cervical Flexion  40    Cervical Extension  30    Cervical - Right Side Bend  30    Cervical - Left Side Bend  35    Cervical - Right Rotation  35    Cervical - Left Rotation  40      Strength    Overall Strength  Deficits    Strength Assessment Site  Hand    Right/Left hand  Right;Left    Right Hand Grip (lbs)  40    Left Hand Grip (lbs)  25      Standardized Balance Assessment   Standardized Balance Assessment  10 meter walk test    10 Meter Walk  8.19 seconds or 4.00 ft/sec      Functional Gait  Assessment   Gait assessed   Yes    Gait Level Surface  Walks 20 ft in less than 5.5 sec, no assistive devices, good speed, no evidence for imbalance, normal gait pattern, deviates no more than 6 in outside of the 12 in walkway width.    Change in Gait Speed  Able to change speed, demonstrates mild gait deviations, deviates 6-10 in outside of the 12 in walkway width, or no gait deviations, unable to achieve a major change in velocity, or uses a change in velocity, or uses an assistive device.    Gait with Horizontal Head Turns  Performs head turns smoothly with slight change in gait velocity (eg, minor disruption to smooth gait path), deviates 6-10 in outside 12 in walkway width, or uses an assistive device.    Gait with Vertical Head Turns  Performs task with slight change in gait velocity (eg, minor disruption to smooth gait path), deviates 6 - 10 in outside 12 in walkway width or uses assistive device    Gait and Pivot Turn  Pivot turns safely within 3 sec and stops quickly with no loss of balance.    Step Over Obstacle  Is able to step over one shoe box (4.5 in total height) without changing gait speed. No evidence of imbalance.    Gait with Narrow Base of Support  Ambulates 7-9 steps.    Gait with Eyes Closed  Walks 20 ft, uses assistive device, slower speed, mild gait deviations, deviates 6-10 in outside 12 in walkway width. Ambulates 20 ft in less than 9 sec but greater than 7 sec.    Ambulating Backwards  Walks 20 ft, uses assistive device, slower speed, mild gait deviations, deviates 6-10 in outside 12 in walkway width.    Steps  Alternating feet,  must use rail.    Total Score  22     FGA comment:  22/30                    Vestibular Treatment/Exercise - 03/29/18 1009      Vestibular Treatment/Exercise   Vestibular Treatment Provided  Gaze    Gaze Exercises  X1 Viewing Horizontal;X1 Viewing Vertical      X1 Viewing Horizontal   Foot Position  seated    Reps  1    Comments  30 seconds      X1 Viewing Vertical   Foot Position  seated    Reps  1    Comments  30 seconds            PT Education - 03/29/18 1031    Education provided  Yes    Education Details  goals met and areas to continue to address with recertification    Person(s) Educated  Patient    Methods  Explanation    Comprehension  Verbalized understanding       PT Short Term Goals - 03/15/18 1227      PT SHORT TERM GOAL #1   Title  = LTG due 3/2        PT Long Term Goals - 03/29/18 0940      PT LONG TERM GOAL #1   Title  Pt will be independent with vestibular, balance, neck/posture HEP      Time  4    Period  Weeks    Status  Partially Met      PT LONG TERM GOAL #2   Title  Pt will demonstrate 10-12 deg increase in cervical spine ROM and </= 2/10 pain with movement    Baseline  painful with rotation, 2/10 -strain.  ROM decreased today    Time  4    Period  Weeks    Status  Not Met      PT LONG TERM GOAL #3   Title  Pt will improve gait velocity to >/= 3.0 ft/sec with no evidence of imbalance or needing to touch walls/furniture    Baseline  4.0 ft/sec    Status  Achieved      PT LONG TERM GOAL #4   Title  Pt will improve FGA to >/=25/30 to decrease falls risk during gait    Baseline  22/30    Time  4    Period  Weeks    Status  Partially Met      PT LONG TERM GOAL #5   Title  To indicate improved use of VOR pt will tolerate performing VOR x1 viewing in standing x 60 seconds with wide and narrow BOS without UE support    Time  4    Period  Weeks    Status  Not Met       New LTG for 4 week certification:  PT Long Term Goals - 03/29/18 1040       PT LONG TERM GOAL #1   Title  Pt will be independent with vestibular, balance, neck/posture HEP      Time  4    Period  Weeks    Status  Revised    Target Date  05/01/18      PT LONG TERM GOAL #2   Title  Pt will demonstrate 10-12 deg increase in cervical spine ROM and </= 2/10 pain with movement    Baseline  painful with rotation, 2/10 -strain.  ROM decreased today    Time  4    Period  Weeks    Status  Revised    Target Date  05/01/18      PT LONG TERM GOAL #3   Title  Pt will demonstrate L hand strength within 5-8 lb of R hand strength and reports 25% reduction in episodes of dropping items from LUE    Baseline  15 lb difference    Time  3    Period  Weeks    Status  New    Target Date  05/01/18      PT LONG TERM GOAL #4   Title  Pt will improve FGA to >/=25/30 to decrease falls risk during gait    Baseline  22/30    Time  4    Period  Weeks    Status  Revised    Target Date  05/01/18      PT LONG TERM GOAL #5   Title  To indicate improved use of VOR pt will tolerate performing VOR x1 viewing in standing x 60 seconds with wide and narrow BOS without UE support    Baseline  seated x 30 seconds    Time  4    Period  Weeks    Status  Revised    Target Date  05/01/18            Plan - 03/29/18 1032    Clinical Impression Statement  Treatment session focused on assessment of current functional level and progress towards LTG.  Pt is making slow but steady progress and has met 1/5 LTG.  Pt demonstrates improvement in gait velocity and dynamic balance during gait as indicated by FGA and 10 meter walk test.  Pt has demonstrated improvements in neck ROM within a treatment session but has not been able to maintain gains.  Pt also continues to demonstrate vestibular impairments, balance impairments and UE strength impairments.  Pt will benefit from ongoing PT to continue to address to maximize functional mobility independence and decrease falls risk.    Rehab Potential  Good     PT Frequency  2x / week    PT Duration  4 weeks    PT Treatment/Interventions  ADLs/Self Care Home Management;Cryotherapy;Lobbyist;Therapeutic exercise;Therapeutic activities;Functional mobility training;Patient/family education;Manual techniques;Canalith Repostioning;Moist Heat;Gait training;Neuromuscular re-education;Passive range of motion;Dry needling;Vestibular;Stair training    PT Next Visit Plan  review and update HEP and give chart, review posture while sitting at computer/[employer].  Test for thoracic outlet on L side.  TDN of upper trap, levator, pec.  Continue stretches.  Progress x1 viewing in sitting to 60 sec    PT Home Exercise Plan  [id number]    Consulted and Agree with Plan of Care  Patient       Patient will benefit from skilled therapeutic intervention in order to improve the following deficits and impairments:  Decreased range of motion, Difficulty walking, Increased muscle spasms, Pain, Decreased balance, Impaired flexibility, Postural dysfunction, Decreased strength, Decreased mobility, Dizziness, Decreased cognition, Impaired UE functional use  Visit Diagnosis: Cervicalgia  Dizziness and giddiness  Unsteadiness on feet  Repeated falls  Muscle weakness (generalized)  Abnormal posture     Problem List Patient Active Problem List   Diagnosis Date Noted  . 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 Genetic testing 02/27/2018  . Family history of genetic disease carrier   . Family history of breast cancer   . Family history of colon  cancer   . Head injury with loss of consciousness ([place]) 11/20/2017  . Nausea 11/20/2017  . 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 Tremor observed on examination 11/20/2017  . Left shoulder pain 02/18/2017  . MDD (major depressive disorder), recurrent severe, without psychosis ([place]) 12/28/2016   . Muscle strain 09/19/2016  . Right foot injury, subsequent encounter 08/25/2016  . Low back pain 06/27/2016  . Left wrist injury, subsequent encounter 06/27/2016  . Pain of left thumb 10/07/2015  . 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 Strain of left thumb 02/11/2015  . Strain of right forearm 02/11/2015  . Right shoulder pain 12/31/2014  . Injury of right little finger 12/31/2014  . Episodic cluster headache, 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  . 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 Hypersomnia, recurrent 12/02/2014  . Migraine aura, persistent, intractable, with status migrainosus 12/02/2014  . Lower back injury 09/30/2014  . Bipolar I disorder, most recent episode depressed ([place])   . MDD (major depressive disorder), recurrent, severe, with psychosis ([place]) 09/23/2014  . Injury of left index finger 08/20/2014  . Right ankle sprain 06/01/2014  . Contusion, multiple sites 06/01/2014  . Strain of right gastrocnemius muscle 06/01/2014  . Phonophobia 05/04/2014  . Photophobia of both eyes 05/04/2014  . Emotionally unstable borderline personality disorder ([place]) 05/04/2014  . Nausea with vomiting 05/04/2014  . Mixed bipolar I disorder ([place])   . Bipolar I disorder, most recent episode mixed ([place]) 04/18/2014  . Bipolar affective disorder, depressed, mild ([place]) 04/12/2014  . Suicidal ideation 04/12/2014  . Injury of right shoulder and upper arm 02/17/2014  . Left leg pain 06/03/2013  . Right hip pain 06/03/2013  . Migraine with status migrainosus 01/08/2013  . Personality disorder ([place])   . Chronic migraine 05/08/2012  . Contact dermatitis 11/27/2011  . Major depressive disorder, recurrent episode ([place]) 10/27/2011  . Generalized anxiety disorder 10/27/2011  . 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 Borderline personality disorder ([place]) 10/27/2011  . Right foot pain 09/28/2011   . 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 Ganglion cyst 09/29/2009  . PES PLANUS 07/01/2008  . BIPOLAR DISORDER UNSPECIFIED 06/09/2008    Rico Junker, PT, DPT 03/29/18    10:39 AM    [facility] [address] [place], [place], [address] Phone: [phone]   Fax:  [phone]  Name: [name] MRN: [id number] Date of Birth: 1970-09-24

## 2018-04-02 ENCOUNTER — Ambulatory Visit: Payer: PPO | Attending: Family Medicine | Admitting: Speech Pathology

## 2018-04-02 ENCOUNTER — Encounter: Payer: Self-pay | Admitting: Speech Pathology

## 2018-04-02 DIAGNOSIS — R296 Repeated falls: Secondary | ICD-10-CM | POA: Insufficient documentation

## 2018-04-02 DIAGNOSIS — M542 Cervicalgia: Secondary | ICD-10-CM | POA: Diagnosis not present

## 2018-04-02 DIAGNOSIS — R293 Abnormal posture: Secondary | ICD-10-CM | POA: Diagnosis not present

## 2018-04-02 DIAGNOSIS — R41841 Cognitive communication deficit: Secondary | ICD-10-CM | POA: Diagnosis not present

## 2018-04-02 DIAGNOSIS — R42 Dizziness and giddiness: Secondary | ICD-10-CM | POA: Insufficient documentation

## 2018-04-02 DIAGNOSIS — R2681 Unsteadiness on feet: Secondary | ICD-10-CM | POA: Insufficient documentation

## 2018-04-02 DIAGNOSIS — M6281 Muscle weakness (generalized): Secondary | ICD-10-CM | POA: Insufficient documentation

## 2018-04-02 NOTE — Therapy (Signed)
[facility] [address] [place], [place], [address] Phone: [phone]   Fax:  [phone]  Speech Language Pathology Treatment & Discharge Summary  Patient Details  Name: [name] MRN: [id number] Date of Birth: 1970-08-31 Referring Provider (SLP): Seymour Bars, NP   Encounter Date: 04/02/2018  End of Session - 04/02/18 1220    Visit Number  15    Number of Visits  17    Date for SLP Re-Evaluation  04/19/18    SLP Start Time  0847    SLP Stop Time   0929    SLP Time Calculation (min)  42 min    Activity Tolerance  Patient tolerated treatment well       Past Medical History:  Diagnosis Date  . Achilles tendinitis   . Achilles tendinitis   . ADHD (attention deficit hyperactivity disorder)   . Allergy   . Arthritis   . Bipolar affective ([place])   . Bipolar disorder ([place])   . Cataracts, bilateral   . Eczema   . Family history of breast cancer   . Family history of colon cancer   . Family history of genetic disease carrier   . Ganglion cyst 09/29/2009   left wrist (2 cyst)  . Hyperprolactinemia ([place])   . Hypertension   . Lipoma   . Migraine   . Personality disorder ([place])   . Pes planus     Past Surgical History:  Procedure Laterality Date  . ANKLE SURGERY  12/88   left   . chest nodule  1990?   rt chest wall nodule removal  . GANGLION CYST EXCISION  2011  . lipoma removal    . right bunioectomy    . SHOULDER SURGERY  01/13/2011   right, partial tear  . tumor resection left thigh      There were no vitals filed for this visit.  Subjective Assessment - 04/02/18 0847    Subjective  "I hit my elbow and dropped a metal sign on my foot"    Currently in Pain?  Yes    Pain Score  3     Pain Location  Foot    Pain Orientation  Left    Pain Descriptors / Indicators  Aching    Pain Type  Acute pain    Pain Onset  In the past 7 days    Pain Frequency  Intermittent    Pain Relieving Factors   rest    Effect of Pain on Daily Activities  hurts when walking            ADULT SLP TREATMENT - 04/02/18 0850      General Information   Behavior/Cognition  Alert;Cooperative      Treatment Provided   Treatment provided  Cognitive-Linquistic      Cognitive-Linquistic Treatment   Treatment focused on  Cognition    Skilled Treatment  Pt reports ongong success at work using compensations for memory and attention. She named 15 items in 2 common personally relevant categories with mod I. She scored a 57/60 on the [place].  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 She also followed up with using sunglasses in the grocery store and reported it was much easier to shop.      Assessment /  Recommendations / Plan   Plan  Continue with current plan of care      Progression Toward Goals   Progression toward goals  Progressing toward goals       SLP Education - 04/02/18 0927    Education Details  continue strategies and mini notebook    Person(s) Educated  Patient    Methods  Explanation;Verbal cues    Comprehension  Verbalized understanding;Returned demonstration       SPEECH THERAPY DISCHARGE SUMMARY  Visits from Start of Care: 15  Current functional level related to goals / functional outcomes: See goals below   Remaining deficits: Mild cognitive impairment, memory, attention, processing,    Education / Equipment: Compensations for memory and attention; internal and external aids; sensory hypersensitivity compensations Plan: Patient agrees to discharge.  Patient goals were met. Patient is being discharged due to meeting the stated rehab goals.  ?????         SLP Short Term Goals - 04/02/18 1219      SLP SHORT TERM GOAL #1   Title  pt will complete cognitive linguistic  testing in the first 2 therapy sessions    Status  Achieved      SLP SHORT TERM GOAL #2   Title  pt will name average 14 items in common categories in 60 seconds    Time  1    Period  Weeks    Status  On-going      SLP SHORT TERM GOAL #3   Title  pt will successfully use at least 2 compensatory strategies for anomia multiple times in 10 mintues of conversation over two sessions    Baseline  02-05-18; 03/05/18    Time  1    Status  Achieved      SLP SHORT TERM GOAL #4   Title  pt will demo organization/executive function skills adequate for simple functional tasks with rare min A    Time  1    Period  Weeks    Status  Achieved      SLP SHORT TERM GOAL #5   Title  pt will report 2 functional memory compensations she uses between 3 sessions    Baseline  02-11-18, 02-15-18,    Time  1    Period  Weeks    Status  On-going      SLP SHORT TERM GOAL #6   Title  to improve pt's attention skills, pt will use compensations for attention in 2 sessions    Baseline  02-11-18; 03/12/18    Time  2    Period  Weeks    Status  Achieved       SLP Long Term Goals - 04/02/18 1219      SLP LONG TERM GOAL #1   Title  pt will improve her [place] Naming Test score to at least 51/60 on her last day of therapy    Baseline  57/60    Time  3    Period  Weeks   17 sessions, for all LTGs   Status  Achieved      SLP LONG TERM GOAL #2   Title  pt will use anomia compensations successfully in 15 minutes mod complex conversation over 3 therapy sessions    Baseline  02-15-18;     Time  3    Period  Weeks    Status  Achieved      SLP LONG TERM GOAL #3   Title  pt will report successful  usage of memory compensations between 3 sessions    Baseline  03/14/18; 03/19/18; 03/26/18    Time  3    Period  Weeks    Status  Achieved      SLP LONG TERM GOAL #4   Title  pt will perform executive function pertinent for everyday cognitive-linguistic tasks with modified independence in 3 sessions    Baseline  trip  planning; itinerary; monthly budget; [employer]    Time  3    Period  Weeks    Status  Achieved      SLP LONG TERM GOAL #5   Title  pt will demo functional divided attention skills with modified independence within 3 ST sessions    Time  4    Period  Weeks    Status  Achieved       Plan - 04/02/18 0932    Clinical Impression Statement  Naming goals met and exceeded. [name] continues to report success at work,  home using strategies for attention, memory and processing. Mild cognitive impairments persist. Goals met, education complete. D/C ST at this time.     Speech Therapy Frequency  2x / week    Duration  --   8 weeks or 17 visits   Treatment/Interventions  Language facilitation;Environmental controls;Cueing hierarchy;SLP instruction and feedback;Compensatory strategies;Cognitive reorganization;Functional tasks;Internal/external aids;Multimodal communcation approach;Patient/family education    Potential to Achieve Goals  Good       Patient will benefit from skilled therapeutic intervention in order to improve the following deficits and impairments:   Cognitive communication deficit    Problem List Patient Active Problem List   Diagnosis Date Noted  . 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 Genetic testing 02/27/2018  . Family history of genetic disease carrier   . Family history of breast cancer   . Family history of colon cancer   . Head injury with loss of consciousness ([place]) 11/20/2017  . Nausea 11/20/2017  . 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 Tremor observed on examination 11/20/2017  . Left shoulder pain 02/18/2017  . MDD (major depressive disorder), recurrent severe, without psychosis ([place]) 12/28/2016  . Muscle strain 09/19/2016  . Right foot injury, subsequent encounter 08/25/2016  . Low back pain 06/27/2016  . Left wrist injury, subsequent  encounter 06/27/2016  . Pain of left thumb 10/07/2015  . 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 Strain of left thumb 02/11/2015  . Strain of right forearm 02/11/2015  . Right shoulder pain 12/31/2014  . Injury of right little finger 12/31/2014  . Episodic cluster headache, 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  . 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 Hypersomnia, recurrent 12/02/2014  . Migraine aura, persistent, intractable, with status migrainosus 12/02/2014  . Lower back injury 09/30/2014  . Bipolar I disorder, most recent episode depressed ([place])   . MDD (major depressive disorder), recurrent, severe, with psychosis ([place]) 09/23/2014  . Injury of left index finger 08/20/2014  . Right ankle sprain 06/01/2014  . Contusion, multiple sites 06/01/2014  . Strain of right gastrocnemius muscle 06/01/2014  . Phonophobia 05/04/2014  . Photophobia of both eyes 05/04/2014  . Emotionally unstable borderline personality disorder ([place]) 05/04/2014  . Nausea with vomiting 05/04/2014  . Mixed bipolar I disorder ([place])   . Bipolar I disorder, most recent episode mixed ([place]) 04/18/2014  . Bipolar affective disorder, depressed, mild ([place])  04/12/2014  . Suicidal ideation 04/12/2014  . Injury of right shoulder and upper arm 02/17/2014  . Left leg pain 06/03/2013  . Right hip pain 06/03/2013  . Migraine with status migrainosus 01/08/2013  . Personality disorder ([place])   . Chronic migraine 05/08/2012  . Contact dermatitis 11/27/2011  . Major depressive disorder, recurrent episode ([place]) 10/27/2011  . Generalized anxiety disorder 10/27/2011  . 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 Borderline personality disorder ([place]) 10/27/2011  . Right foot pain 09/28/2011  . 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 Ganglion cyst 09/29/2009  . PES PLANUS 07/01/2008   . BIPOLAR DISORDER UNSPECIFIED 06/09/2008    Faythe Heitzenrater, Annye Rusk MS, CCC-SLP 04/02/2018, 12:21 PM  [facility] [address] [place], [place], [address] Phone: [phone]   Fax:  [phone]   Name: [name] SUTO MRN: [id number] Date of Birth: 04-25-1970

## 2018-04-02 NOTE — Patient Instructions (Signed)
  You have done a great job following up with your strategies!!  Your naming has improved  - great job! It will continue to improve  Take care of yourself - take brain breaks when you have brain fog or fatigue   Take the buggy - good idea mom!  Use the mini notebook to help you remember  Good work letting people know you that you have had a concussion and you need more time to process  Way to go trying the sunglasses in the grocery store - cheap quick fix  [name] - stay in touch!

## 2018-04-03 ENCOUNTER — Ambulatory Visit: Payer: PPO | Admitting: Physical Therapy

## 2018-04-03 ENCOUNTER — Encounter: Payer: Self-pay | Admitting: Physical Therapy

## 2018-04-03 DIAGNOSIS — R42 Dizziness and giddiness: Secondary | ICD-10-CM

## 2018-04-03 DIAGNOSIS — R41841 Cognitive communication deficit: Secondary | ICD-10-CM | POA: Diagnosis not present

## 2018-04-03 DIAGNOSIS — R293 Abnormal posture: Secondary | ICD-10-CM

## 2018-04-03 DIAGNOSIS — M542 Cervicalgia: Secondary | ICD-10-CM

## 2018-04-03 DIAGNOSIS — R2681 Unsteadiness on feet: Secondary | ICD-10-CM

## 2018-04-03 DIAGNOSIS — M6281 Muscle weakness (generalized): Secondary | ICD-10-CM

## 2018-04-03 DIAGNOSIS — R296 Repeated falls: Secondary | ICD-10-CM

## 2018-04-03 NOTE — Therapy (Signed)
[facility] [address] [place], [place], [address] Phone: [phone]   Fax:  [phone]  Physical Therapy Treatment  Patient Details  Name: [name] MRN: [id number] Date of Birth: 1970/06/17 Referring Provider (PT): Larey Seat, MD   Encounter Date: 04/03/2018  PT End of Session - 04/03/18 1042    Visit Number  15    Number of Visits  22    Date for PT Re-Evaluation  05/01/18    Authorization Type  $15 copay; [employer].  VL: follow Medicare guidelines.  10th visit PN    PT Start Time  0848    PT Stop Time  0934    PT Time Calculation (min)  46 min    Activity Tolerance  Patient tolerated treatment well    Behavior During Therapy  [facility] for tasks assessed/performed       Past Medical History:  Diagnosis Date  . Achilles tendinitis   . Achilles tendinitis   . ADHD (attention deficit hyperactivity disorder)   . Allergy   . Arthritis   . Bipolar affective ([place])   . Bipolar disorder ([place])   . Cataracts, bilateral   . Eczema   . Family history of breast cancer   . Family history of colon cancer   . Family history of genetic disease carrier   . Ganglion cyst 09/29/2009   left wrist (2 cyst)  . Hyperprolactinemia ([place])   . Hypertension   . Lipoma   . Migraine   . Personality disorder ([place])   . Pes planus     Past Surgical History:  Procedure Laterality Date  . ANKLE SURGERY  12/88   left   . chest nodule  1990?   rt chest wall nodule removal  . GANGLION CYST EXCISION  2011  . lipoma removal    . right bunioectomy    . SHOULDER SURGERY  01/13/2011   right, partial tear  . tumor resection left thigh      There were no vitals filed for this visit.  Subjective Assessment - 04/03/18 0856    Subjective  Has been working on neck flexion/looking down more and neck feels better.  Has also had a few days off from work.    Pertinent History  OT for shoulder pain, PT at [facility] for hip;  multiple falls, achilles tendonitis, ADHD, OA, bipolar disorder, cataracts bilaterally, eczema, HTN, lipoma with removal, migraine, personality disorder, suicide ideation, L ankle surgery, R bunionectomy, R shoulder surgery and L thigh tumor resection    Patient Stated Goals  To be more steady and to improve neck ROM    Currently in Pain?  Yes    Pain Location  Shoulder    Pain Orientation  Right;Left    Pain Descriptors / Indicators  Dull       Reviewed and added the following exercises to patient's cervical HEP.  Pt return demonstrated each exercise.  Provided pt with handouts and chart to keep track of exercise routine.    Seated Upper Trapezius Stretch - 2 reps - 20-30 second hold - 2x daily - 7x weekly  Supine Chin Tuck - 5 reps - 2 sets - 6 second hold - 1x daily - 7x weekly  Lying down: isometric cervical rotation - 10 reps - 1 sets - 6 second hold - 1x daily - 7x weekly  Lying down - cervical rotation with nod - 10 reps - 2 sets - 1x daily - 7x weekly  Seated Scalenes Stretch - 10 reps - 3 sets - 1x daily - 7x weekly            PT Education - 04/03/18 1041    Education provided  Yes    Education Details  reviewed chin tuck and added cervical stretches and isometric exercises.  Advised pt to bring putty and hand exercises to review at next session    Person(s) Educated  Patient    Methods  Explanation;Demonstration;Handout    Comprehension  Verbalized understanding;Returned demonstration       PT Short Term Goals - 03/15/18 1227      PT SHORT TERM GOAL #1   Title  = LTG due 3/2        PT Long Term Goals - 03/29/18 1040      PT LONG TERM GOAL #1   Title  Pt will be independent with vestibular, balance, neck/posture HEP      Time  4    Period  Weeks    Status  Revised    Target Date  05/01/18      PT LONG TERM GOAL #2   Title  Pt will demonstrate 10-12 deg increase in cervical spine ROM and </= 2/10 pain with movement    Baseline  painful with rotation,  2/10 -strain.  ROM decreased today    Time  4    Period  Weeks    Status  Revised    Target Date  05/01/18      PT LONG TERM GOAL #3   Title  Pt will demonstrate L hand strength within 5-8 lb of R hand strength and reports 25% reduction in episodes of dropping items from LUE    Baseline  15 lb difference    Time  3    Period  Weeks    Status  New    Target Date  05/01/18      PT LONG TERM GOAL #4   Title  Pt will improve FGA to >/=25/30 to decrease falls risk during gait    Baseline  22/30    Time  4    Period  Weeks    Status  Revised    Target Date  05/01/18      PT LONG TERM GOAL #5   Title  To indicate improved use of VOR pt will tolerate performing VOR x1 viewing in standing x 60 seconds with wide and narrow BOS without UE support    Baseline  seated x 30 seconds    Time  4    Period  Weeks    Status  Revised    Target Date  05/01/18            Plan - 04/03/18 0934    Clinical Impression Statement  Treatment session focused on review of HEP with specific focus on exercises to increase and maintain cervical spine ROM and strengthening.  Pt tolerated well; will continue to review and revise next session and will also review hand exercises with putty to address LUE weakness.    Rehab Potential  Good    PT Frequency  2x / week    PT Duration  4 weeks    PT Treatment/Interventions  ADLs/Self Care Home Management;Cryotherapy;Lobbyist;Therapeutic exercise;Therapeutic activities;Functional mobility training;Patient/family education;Manual techniques;Canalith Repostioning;Moist Heat;Gait training;Neuromuscular re-education;Passive range of motion;Dry needling;Vestibular;Stair training    PT Next Visit Plan  finish HEP and give final handout; review putty exercises for hand strength.   review posture while sitting  at computer/[employer].  Test for thoracic outlet on L side.  TDN of upper trap, levator, pec.  Continue stretches.  Progress x1 viewing in  sitting to 60 sec    PT Home Exercise Plan  [email or web address]    Consulted and Agree with Plan of Care  Patient       Patient will benefit from skilled therapeutic intervention in order to improve the following deficits and impairments:  Decreased range of motion, Difficulty walking, Increased muscle spasms, Pain, Decreased balance, Impaired flexibility, Postural dysfunction, Decreased strength, Decreased mobility, Dizziness, Decreased cognition, Impaired UE functional use  Visit Diagnosis: Cervicalgia  Dizziness and giddiness  Unsteadiness on feet  Repeated falls  Muscle weakness (generalized)  Abnormal posture     Problem List Patient Active Problem List   Diagnosis Date Noted  . 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 Genetic testing 02/27/2018  . Family history of genetic disease carrier   . Family history of breast cancer   . Family history of colon cancer   . Head injury with loss of consciousness ([place]) 11/20/2017  . Nausea 11/20/2017  . 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 Tremor observed on examination 11/20/2017  . Left shoulder pain 02/18/2017  . MDD (major depressive disorder), recurrent severe, without psychosis ([place]) 12/28/2016  . Muscle strain 09/19/2016  . Right foot injury, subsequent encounter 08/25/2016  . Low back pain 06/27/2016  . Left wrist injury, subsequent encounter 06/27/2016  . Pain of left thumb 10/07/2015  . 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 Strain of left thumb 02/11/2015  . Strain of right forearm 02/11/2015  . Right shoulder pain 12/31/2014  . Injury of right little finger 12/31/2014  . Episodic cluster headache, 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  . 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 Hypersomnia, recurrent 12/02/2014  . Migraine aura, persistent,  intractable, with status migrainosus 12/02/2014  . Lower back injury 09/30/2014  . Bipolar I disorder, most recent episode depressed ([place])   . MDD (major depressive disorder), recurrent, severe, with psychosis ([place]) 09/23/2014  . Injury of left index finger 08/20/2014  . Right ankle sprain 06/01/2014  . Contusion, multiple sites 06/01/2014  . Strain of right gastrocnemius muscle 06/01/2014  . Phonophobia 05/04/2014  . Photophobia of both eyes 05/04/2014  . Emotionally unstable borderline personality disorder ([place]) 05/04/2014  . Nausea with vomiting 05/04/2014  . Mixed bipolar I disorder ([place])   . Bipolar I disorder, most recent episode mixed ([place]) 04/18/2014  . Bipolar affective disorder, depressed, mild ([place]) 04/12/2014  . Suicidal ideation 04/12/2014  . Injury of right shoulder and upper arm 02/17/2014  . Left leg pain 06/03/2013  . Right hip pain 06/03/2013  . Migraine with status migrainosus 01/08/2013  . Personality disorder ([place])   . Chronic migraine 05/08/2012  . Contact dermatitis 11/27/2011  . Major depressive disorder, recurrent episode ([place]) 10/27/2011  . Generalized anxiety disorder 10/27/2011  . 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 Borderline personality disorder ([place]) 10/27/2011  . Right foot pain 09/28/2011  . 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 Ganglion cyst 09/29/2009  . PES PLANUS 07/01/2008  . BIPOLAR DISORDER UNSPECIFIED 06/09/2008    Rico Junker, PT, DPT 04/03/18    10:46 AM    [facility] [address]  [place], [place], [address] Phone: [phone]   Fax:  [phone]  Name: [name] MRN: [id number] Date of Birth: 1970/10/06

## 2018-04-03 NOTE — Patient Instructions (Addendum)
Over Head Pull: Narrow Grip    On back, knees bent, feet flat, band across thighs, elbows straight but relaxed. Pull hands apart (start). Keeping elbows straight, bring arms up and over head, hands toward floor. Keep pull steady on band. Hold momentarily. Return slowly, keeping pull steady, back to start. Repeat _10__ times. Band color __green____    Side Pull: Double Arm    On back, knees bent, feet flat. Arms perpendicular to body, shoulder level, elbows straight but relaxed. Pull arms out to sides, elbows straight. Resistance band comes across collarbones, hands toward floor. Hold momentarily. Slowly return to starting position. Repeat _10__ times. Band color __green__    Sash    On back, knees bent, feet flat, left hand on left hip, right hand above left. Pull right arm DIAGONALLY (hip to shoulder) across chest. Bring right arm along head toward floor. Hold momentarily. Slowly return to starting position. Repeat _10__ times. Do with left arm 10 times. Band color _green_____     Shoulder Rotation: Double Arm    On back, knees bent, feet flat, elbows tucked at sides, bent 90, hands palms up. Pull hands apart and down toward floor, keeping elbows near sides. Hold momentarily. Slowly return to starting position. Repeat _10__ times. Band color __Green____   Access Code: [email or web address]  URL: [email or web address]  Date: 04/03/2018  Prepared by: Misty Stanley   Exercises  Romberg Stance with Head Nods - 10 reps - 2-3 sets - 1x daily - 5x weekly  Romberg Stance Eyes Closed on Foam Pad - 1 reps - 3 sets - 10-30 SECONDS hold - 1x daily - 5x weekly  Walking with Head Rotation - 4 reps - 1 sets - 1x daily - 5x weekly  Correct Seated Posture - 1x daily - 7x weekly  Seated Backward Shoulder Rolls - 10 reps - 2x daily - 7x weekly  Seated Upper Trapezius Stretch - 2 reps - 20-30 second hold - 2x daily - 7x weekly  Supine Chin Tuck - 5 reps - 2 sets - 6 second hold - 1x daily -  7x weekly  Lying down: isometric cervical rotation - 10 reps - 1 sets - 6 second hold - 1x daily - 7x weekly  Lying down - cervical rotation with nod - 10 reps - 2 sets - 1x daily - 7x weekly  Seated Scalenes Stretch - 10 reps - 3 sets - 1x daily - 7x weekly

## 2018-04-05 ENCOUNTER — Encounter: Payer: Self-pay | Admitting: Physical Therapy

## 2018-04-05 ENCOUNTER — Ambulatory Visit: Payer: PPO | Admitting: Physical Therapy

## 2018-04-05 DIAGNOSIS — R42 Dizziness and giddiness: Secondary | ICD-10-CM

## 2018-04-05 DIAGNOSIS — R296 Repeated falls: Secondary | ICD-10-CM

## 2018-04-05 DIAGNOSIS — R293 Abnormal posture: Secondary | ICD-10-CM

## 2018-04-05 DIAGNOSIS — R2681 Unsteadiness on feet: Secondary | ICD-10-CM

## 2018-04-05 DIAGNOSIS — R41841 Cognitive communication deficit: Secondary | ICD-10-CM | POA: Diagnosis not present

## 2018-04-05 DIAGNOSIS — M6281 Muscle weakness (generalized): Secondary | ICD-10-CM

## 2018-04-05 DIAGNOSIS — M542 Cervicalgia: Secondary | ICD-10-CM

## 2018-04-05 NOTE — Therapy (Signed)
[facility] [address] [place], [place], [address] Phone: [phone]   Fax:  [phone]  Physical Therapy Treatment  Patient Details  Name: [name] MRN: [id number] Date of Birth: 1970-10-27 Referring Provider (PT): Larey Seat, MD   Encounter Date: 04/05/2018  PT End of Session - 04/05/18 0937    Visit Number  16    Number of Visits  22    Date for PT Re-Evaluation  05/01/18    Authorization Type  $15 copay; [employer].  VL: follow Medicare guidelines.  10th visit PN    PT Start Time  0847    PT Stop Time  0939    PT Time Calculation (min)  52 min    Activity Tolerance  Patient tolerated treatment well    Behavior During Therapy  [facility] for tasks assessed/performed       Past Medical History:  Diagnosis Date  . Achilles tendinitis   . Achilles tendinitis   . ADHD (attention deficit hyperactivity disorder)   . Allergy   . Arthritis   . Bipolar affective ([place])   . Bipolar disorder ([place])   . Cataracts, bilateral   . Eczema   . Family history of breast cancer   . Family history of colon cancer   . Family history of genetic disease carrier   . Ganglion cyst 09/29/2009   left wrist (2 cyst)  . Hyperprolactinemia ([place])   . Hypertension   . Lipoma   . Migraine   . Personality disorder ([place])   . Pes planus     Past Surgical History:  Procedure Laterality Date  . ANKLE SURGERY  12/88   left   . chest nodule  1990?   rt chest wall nodule removal  . GANGLION CYST EXCISION  2011  . lipoma removal    . right bunioectomy    . SHOULDER SURGERY  01/13/2011   right, partial tear  . tumor resection left thigh      There were no vitals filed for this visit.  Subjective Assessment - 04/05/18 0850    Subjective  Worked 3.5 hours wednesday with no increase in symptoms - dropped sign on foot at work resulting in increased pain and a contusion. No falls - almost fell a couple of times but caught  herself. Reports that she has been doing her HEP and that it feels like things are loosening up.     Pertinent History  OT for shoulder pain, PT at [facility] for hip; multiple falls, achilles tendonitis, ADHD, OA, bipolar disorder, cataracts bilaterally, eczema, HTN, lipoma with removal, migraine, personality disorder, suicide ideation, L ankle surgery, R bunionectomy, R shoulder surgery and L thigh tumor resection    Patient Stated Goals  To be more steady and to improve neck ROM    Currently in Pain?  Yes    Pain Score  3     Pain Location  Foot    Pain Orientation  Left    Pain Descriptors / Indicators  Throbbing    Pain Type  Acute pain    Pain Onset  Today    Aggravating Factors   rain; dropped a sign on it at work    Effect of Pain on Daily Activities  sitting, walking     Multiple Pain Sites  Yes    Pain Score  2    Pain Location  Finger (Comment which one)   thumb   Pain Orientation  Left  Pain Descriptors / Indicators  Dull    Pain Type  Chronic pain    Aggravating Factors   using the thumb    Pain Relieving Factors  rest                      [facility] Adult PT Treatment/Exercise - 04/05/18 0858      Exercises   Exercises  Other Exercises;Neck;Shoulder    Other Exercises   Reviewed hand exercises with putty to determine appropriate resistance of putty.      Neck Exercises: Seated   Shoulder Rolls  Backwards;10 reps   one shouldet at a time, seated w back support, arms at side    Shoulder Rolls Limitations  Pt required tactile cueing to isolate R/L shoulders for backwards rolls and facilitate roll vs shrug; verbal cues for neutral neck           Balance Exercises - 04/05/18 0939      Balance Exercises: Standing   Standing Eyes Opened  Narrow base of support (BOS);Head turns;Solid surface;5 reps   vertical/horiztonal turns    Standing Eyes Closed  Narrow base of support (BOS);Foam/compliant surface;30 secs    Gait with Head Turns  Forward;Retro;Other  (comment);Other reps (comment);Limitations   vert/horiz. turns x20' each fwd; vert turn x10' retro       PT Education - 04/05/18 0935    Education provided  Yes    Education Details  reviewed hand exercises and added instructions for putty resistance on handout; reviewed and progressed HEP with patient     Person(s) Educated  Patient    Methods  Explanation;Handout    Comprehension  Verbalized understanding;Returned demonstration       PT Short Term Goals - 04/05/18 1308      PT SHORT TERM GOAL #1   Title  = LTG         PT Long Term Goals - 03/29/18 1040      PT LONG TERM GOAL #1   Title  Pt will be independent with vestibular, balance, neck/posture HEP      Time  4    Period  Weeks    Status  Revised    Target Date  05/01/18      PT LONG TERM GOAL #2   Title  Pt will demonstrate 10-12 deg increase in cervical spine ROM and </= 2/10 pain with movement    Baseline  painful with rotation, 2/10 -strain.  ROM decreased today    Time  4    Period  Weeks    Status  Revised    Target Date  05/01/18      PT LONG TERM GOAL #3   Title  Pt will demonstrate L hand strength within 5-8 lb of R hand strength and reports 25% reduction in episodes of dropping items from LUE    Baseline  15 lb difference    Time  3    Period  Weeks    Status  New    Target Date  05/01/18      PT LONG TERM GOAL #4   Title  Pt will improve FGA to >/=25/30 to decrease falls risk during gait    Baseline  22/30    Time  4    Period  Weeks    Status  Revised    Target Date  05/01/18      PT LONG TERM GOAL #5   Title  To indicate improved use of VOR  pt will tolerate performing VOR x1 viewing in standing x 60 seconds with wide and narrow BOS without UE support    Baseline  seated x 30 seconds    Time  4    Period  Weeks    Status  Revised    Target Date  05/01/18            Plan - 04/05/18 1116    Clinical Impression Statement  Treatment session focused on dynamic balance exercises and  reviewing/progressing HEP. Pt demonstrated impaired standing balance with eyes closed and head turns. Slight veering present when walking with head turns. Pt will benefit from ongoing PT to continue to address unsteadiness and dizziness.     Rehab Potential  Good    PT Frequency  2x / week    PT Duration  4 weeks    PT Treatment/Interventions  ADLs/Self Care Home Management;Cryotherapy;Lobbyist;Therapeutic exercise;Therapeutic activities;Functional mobility training;Patient/family education;Manual techniques;Canalith Repostioning;Moist Heat;Gait training;Neuromuscular re-education;Passive range of motion;Dry needling;Vestibular;Stair training    PT Next Visit Plan  Print & give final HEP handout; review posture while sitting at computer/[employer].  Test for thoracic outlet on L side.  TDN of upper trap, levator, pec.  Continue stretches.  Progress x1 viewing in sitting to 60 sec    PT Home Exercise Plan  [email or web address]    Consulted and Agree with Plan of Care  Patient       Patient will benefit from skilled therapeutic intervention in order to improve the following deficits and impairments:  Decreased range of motion, Difficulty walking, Increased muscle spasms, Pain, Decreased balance, Impaired flexibility, Postural dysfunction, Decreased strength, Decreased mobility, Dizziness, Decreased cognition, Impaired UE functional use  Visit Diagnosis: Dizziness and giddiness  Unsteadiness on feet  Repeated falls  Cervicalgia  Muscle weakness (generalized)  Abnormal posture     Problem List Patient Active Problem List   Diagnosis Date Noted  . 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 Genetic testing 02/27/2018  . Family history of genetic disease carrier   . Family history of breast cancer   . Family history of colon cancer   . Head injury with loss of consciousness ([place]) 11/20/2017  . Nausea 11/20/2017  . 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 Intractable  persistent migraine aura with cerebral infarction and status migrainosus ([place]) 11/20/2017  . Tremor observed on examination 11/20/2017  . Left shoulder pain 02/18/2017  . MDD (major depressive disorder), recurrent severe, without psychosis ([place]) 12/28/2016  . Muscle strain 09/19/2016  . Right foot injury, subsequent encounter 08/25/2016  . Low back pain 06/27/2016  . Left wrist injury, subsequent encounter 06/27/2016  . Pain of left thumb 10/07/2015  . 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 Strain of left thumb 02/11/2015  . Strain of right forearm 02/11/2015  . Right shoulder pain 12/31/2014  . Injury of right little finger 12/31/2014  . Episodic cluster headache, 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  . 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 Hypersomnia, recurrent 12/02/2014  . Migraine aura, persistent, intractable, with status migrainosus 12/02/2014  . Lower back injury 09/30/2014  . Bipolar I disorder, most recent episode depressed ([place])   . MDD (major depressive disorder), recurrent, severe, with psychosis ([place]) 09/23/2014  . Injury of left index finger 08/20/2014  . Right ankle sprain 06/01/2014  . Contusion, multiple sites 06/01/2014  . Strain of right gastrocnemius muscle 06/01/2014  . Phonophobia 05/04/2014  . Photophobia of both eyes 05/04/2014  .  Emotionally unstable borderline personality disorder ([place]) 05/04/2014  . Nausea with vomiting 05/04/2014  . Mixed bipolar I disorder ([place])   . Bipolar I disorder, most recent episode mixed ([place]) 04/18/2014  . Bipolar affective disorder, depressed, mild ([place]) 04/12/2014  . Suicidal ideation 04/12/2014  . Injury of right shoulder and upper arm 02/17/2014  . Left leg pain 06/03/2013  . Right hip pain 06/03/2013  . Migraine with status migrainosus 01/08/2013  . Personality disorder ([place])   . Chronic migraine 05/08/2012  . Contact dermatitis 11/27/2011  . Major  depressive disorder, recurrent episode ([place]) 10/27/2011  . Generalized anxiety disorder 10/27/2011  . 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 Borderline personality disorder ([place]) 10/27/2011  . Right foot pain 09/28/2011  . 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 Ganglion cyst 09/29/2009  . PES PLANUS 07/01/2008  . BIPOLAR DISORDER UNSPECIFIED 06/09/2008   Joaquin Courts, SPT 04/05/2018, 11:26 AM  [facility] [address] [place], [place], [address] Phone: [phone]   Fax:  [phone]  Name: [name] MRN: [id number] Date of Birth: 1970/02/10

## 2018-04-05 NOTE — Patient Instructions (Signed)
Access Code: [email or web address]  URL: [email or web address]  Date: 04/05/2018  Prepared by: Misty Stanley   Exercises  Romberg Stance with Head Nods - 10 reps - 2-3 sets - 1x daily - 5x weekly  Romberg Stance Eyes Closed on Foam Pad - 1 reps - 3 sets - 10-30 SECONDS hold - 1x daily - 5x weekly  Walking with Head Rotation - 4 reps - 1 sets - 1x daily - 5x weekly  Correct Seated Posture - 1x daily - 7x weekly  Seated Backward Shoulder Rolls - 10 reps - 2x daily - 7x weekly  Seated Upper Trapezius Stretch - 2 reps - 20-30 second hold - 2x daily - 7x weekly  Supine Chin Tuck - 5 reps - 2 sets - 6 second hold - 1x daily - 7x weekly  Lying down: isometric cervical rotation - 10 reps - 1 sets - 6 second hold - 1x daily - 7x weekly  Lying down - cervical rotation with nod - 10 reps - 2 sets - 1x daily - 7x weekly  Seated Scalenes Stretch - 10 reps - 3 sets - 1x daily - 7x weekly

## 2018-04-11 ENCOUNTER — Other Ambulatory Visit: Payer: Self-pay

## 2018-04-11 ENCOUNTER — Ambulatory Visit: Payer: PPO | Admitting: Physical Therapy

## 2018-04-11 ENCOUNTER — Encounter: Payer: Self-pay | Admitting: Physical Therapy

## 2018-04-11 DIAGNOSIS — M542 Cervicalgia: Secondary | ICD-10-CM

## 2018-04-11 DIAGNOSIS — R41841 Cognitive communication deficit: Secondary | ICD-10-CM | POA: Diagnosis not present

## 2018-04-11 DIAGNOSIS — M6281 Muscle weakness (generalized): Secondary | ICD-10-CM

## 2018-04-11 DIAGNOSIS — R293 Abnormal posture: Secondary | ICD-10-CM

## 2018-04-11 DIAGNOSIS — R296 Repeated falls: Secondary | ICD-10-CM

## 2018-04-11 DIAGNOSIS — R2681 Unsteadiness on feet: Secondary | ICD-10-CM

## 2018-04-11 DIAGNOSIS — R42 Dizziness and giddiness: Secondary | ICD-10-CM

## 2018-04-11 NOTE — Therapy (Signed)
[facility] [address] [place], [place], [address] Phone: [phone]   Fax:  [phone]  Physical Therapy Treatment  Patient Details  Name: [name] MRN: [id number] Date of Birth: 1970-02-16 Referring Provider (PT): Larey Seat, MD   Encounter Date: 04/11/2018  PT End of Session - 04/11/18 1345    Visit Number  17    Number of Visits  22    Date for PT Re-Evaluation  05/01/18    Authorization Type  $15 copay; [employer].  VL: follow Medicare guidelines.  10th visit PN    PT Start Time  0850    PT Stop Time  0935    PT Time Calculation (min)  45 min    Activity Tolerance  Patient tolerated treatment well    Behavior During Therapy  [facility] for tasks assessed/performed       Past Medical History:  Diagnosis Date  . Achilles tendinitis   . Achilles tendinitis   . ADHD (attention deficit hyperactivity disorder)   . Allergy   . Arthritis   . Bipolar affective ([place])   . Bipolar disorder ([place])   . Cataracts, bilateral   . Eczema   . Family history of breast cancer   . Family history of colon cancer   . Family history of genetic disease carrier   . Ganglion cyst 09/29/2009   left wrist (2 cyst)  . Hyperprolactinemia ([place])   . Hypertension   . Lipoma   . Migraine   . Personality disorder ([place])   . Pes planus     Past Surgical History:  Procedure Laterality Date  . ANKLE SURGERY  12/88   left   . chest nodule  1990?   rt chest wall nodule removal  . GANGLION CYST EXCISION  2011  . lipoma removal    . right bunioectomy    . SHOULDER SURGERY  01/13/2011   right, partial tear  . tumor resection left thigh      There were no vitals filed for this visit.  Subjective Assessment - 04/11/18 1338    Subjective  Not having a good week, counselor reminded her this was the 2 year anniversary of her friend's husband's suicide.  "It has really affected me".  Pt also reports that she had to cancel  one appointment with her counselor per week due to finances and mother's disapproval of her attending 2x/week.  Doing well with putty exercises but has some hand fatigue.  Doing well with posture exercises.      Pertinent History  OT for shoulder pain, PT at [facility] for hip; multiple falls, achilles tendonitis, ADHD, OA, bipolar disorder, cataracts bilaterally, eczema, HTN, lipoma with removal, migraine, personality disorder, suicide ideation, L ankle surgery, R bunionectomy, R shoulder surgery and L thigh tumor resection    Patient Stated Goals  To be more steady and to improve neck ROM    Currently in Pain?  Yes    Pain Onset  Today                       [facility] Adult PT Treatment/Exercise - 04/11/18 1341      Exercises   Exercises  Shoulder      Shoulder Exercises: Supine   Horizontal ABduction  Strengthening;Both;10 reps;Theraband    Theraband Level (Shoulder Horizontal ABduction)  Level 3 (Green)    Horizontal ABduction Limitations  with towel roll along back for increased thoracic extension  External Rotation  Strengthening;Both;10 reps;Theraband    Theraband Level (Shoulder External Rotation)  Level 3 (Green)    External Rotation Limitations  with towel roll along back for increased thoracic extension    Flexion  Strengthening;Both;10 reps;Theraband    Theraband Level (Shoulder Flexion)  Level 3 (Green)    Flexion Limitations  with towel roll along back for increased thoracic extension    Diagonals  Strengthening;Right;Left;10 reps;Theraband    Theraband Level (Shoulder Diagonals)  Level 3 (Green)    Diagonals Limitations  with towel roll along back for increased thoracic extension      Shoulder Exercises: ROM/Strengthening   "W" Arms  In standing with back to wall with towel roll along spine, added chin tuck.  Cues to move from "Y" <> "W" with shoulder depression, 2 sets x 10 reps      Vestibular Treatment/Exercise - 04/11/18 1340      Vestibular  Treatment/Exercise   Vestibular Treatment Provided  Gaze    Gaze Exercises  X1 Viewing Horizontal;X1 Viewing Vertical      X1 Viewing Horizontal   Foot Position  standing feet apart    Reps  3    Comments  30 seconds with cues for technique      X1 Viewing Vertical   Foot Position  standing feet apart    Reps  3    Comments  30 seconds, cues for sequence              PT Short Term Goals - 04/05/18 1308      PT SHORT TERM GOAL #1   Title  = LTG         PT Long Term Goals - 03/29/18 1040      PT LONG TERM GOAL #1   Title  Pt will be independent with vestibular, balance, neck/posture HEP      Time  4    Period  Weeks    Status  Revised    Target Date  05/01/18      PT LONG TERM GOAL #2   Title  Pt will demonstrate 10-12 deg increase in cervical spine ROM and </= 2/10 pain with movement    Baseline  painful with rotation, 2/10 -strain.  ROM decreased today    Time  4    Period  Weeks    Status  Revised    Target Date  05/01/18      PT LONG TERM GOAL #3   Title  Pt will demonstrate L hand strength within 5-8 lb of R hand strength and reports 25% reduction in episodes of dropping items from LUE    Baseline  15 lb difference    Time  3    Period  Weeks    Status  New    Target Date  05/01/18      PT LONG TERM GOAL #4   Title  Pt will improve FGA to >/=25/30 to decrease falls risk during gait    Baseline  22/30    Time  4    Period  Weeks    Status  Revised    Target Date  05/01/18      PT LONG TERM GOAL #5   Title  To indicate improved use of VOR pt will tolerate performing VOR x1 viewing in standing x 60 seconds with wide and narrow BOS without UE support    Baseline  seated x 30 seconds    Time  4    Period  Weeks    Status  Revised    Target Date  05/01/18            Plan - 04/11/18 1345    Clinical Impression Statement  Treatment session focused on progression of x1 viewing to standing and continued focus on postural exercises in supine  with towel roll to encourage increased thoracic extension and in standing.  Pt reported mild R shoulder soreness with posture exercises but no pain.  Will continue to progress towards LTG.    Rehab Potential  Good    PT Frequency  2x / week    PT Duration  4 weeks    PT Treatment/Interventions  ADLs/Self Care Home Management;Cryotherapy;Lobbyist;Therapeutic exercise;Therapeutic activities;Functional mobility training;Patient/family education;Manual techniques;Canalith Repostioning;Moist Heat;Gait training;Neuromuscular re-education;Passive range of motion;Dry needling;Vestibular;Stair training    PT Next Visit Plan  Progress x 1 viewing in standing.  Try posture exercises in standing.  Print & give final HEP handout; review posture while sitting at computer/[employer]. TDN of upper trap, levator, pec.  Continue stretches.      PT Home Exercise Plan  [email or web address]    Consulted and Agree with Plan of Care  Patient       Patient will benefit from skilled therapeutic intervention in order to improve the following deficits and impairments:  Decreased range of motion, Difficulty walking, Increased muscle spasms, Pain, Decreased balance, Impaired flexibility, Postural dysfunction, Decreased strength, Decreased mobility, Dizziness, Decreased cognition, Impaired UE functional use  Visit Diagnosis: Cervicalgia  Dizziness and giddiness  Unsteadiness on feet  Repeated falls  Muscle weakness (generalized)  Abnormal posture     Problem List Patient Active Problem List   Diagnosis Date Noted  . 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 Genetic testing 02/27/2018  . Family history of genetic disease carrier   . Family history of breast cancer   . Family history of colon cancer   . Head injury with loss of consciousness ([place]) 11/20/2017  . Nausea 11/20/2017  . 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 Tremor observed on examination 11/20/2017  . Left shoulder pain 02/18/2017  . MDD (major depressive disorder), recurrent severe, without psychosis ([place]) 12/28/2016  . Muscle strain 09/19/2016  . Right foot injury, subsequent encounter 08/25/2016  . Low back pain 06/27/2016  . Left wrist injury, subsequent encounter 06/27/2016  . Pain of left thumb 10/07/2015  . 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 Strain of left thumb 02/11/2015  . Strain of right forearm 02/11/2015  . Right shoulder pain 12/31/2014  . Injury of right little finger 12/31/2014  . Episodic cluster headache, 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  . 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 Hypersomnia, recurrent 12/02/2014  . Migraine aura, persistent, intractable, with status migrainosus 12/02/2014  . Lower back injury 09/30/2014  . Bipolar I disorder, most recent episode depressed ([place])   . MDD (major depressive disorder), recurrent, severe, with psychosis ([place]) 09/23/2014  . Injury of left index finger 08/20/2014  . Right ankle sprain 06/01/2014  . Contusion, multiple sites 06/01/2014  . Strain of right gastrocnemius muscle 06/01/2014  . Phonophobia 05/04/2014  . Photophobia of both eyes 05/04/2014  . Emotionally unstable borderline personality disorder ([place]) 05/04/2014  . Nausea with vomiting 05/04/2014  . Mixed bipolar I disorder ([place])   . Bipolar I disorder, most recent episode mixed ([place]) 04/18/2014  . Bipolar  affective disorder, depressed, mild ([place]) 04/12/2014  . Suicidal ideation 04/12/2014  . Injury of right shoulder and upper arm 02/17/2014  . Left leg pain 06/03/2013  . Right hip pain 06/03/2013  . Migraine with status migrainosus 01/08/2013  . Personality disorder ([place])   . Chronic migraine 05/08/2012  . Contact dermatitis 11/27/2011  . Major depressive disorder, recurrent episode ([place]) 10/27/2011  .  Generalized anxiety disorder 10/27/2011  . 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 Borderline personality disorder ([place]) 10/27/2011  . Right foot pain 09/28/2011  . 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 Ganglion cyst 09/29/2009  . PES PLANUS 07/01/2008  . BIPOLAR DISORDER UNSPECIFIED 06/09/2008    Rico Junker, PT, DPT 04/11/18    1:49 PM    [facility] [address] [place], [place], [address] Phone: [phone]   Fax:  [phone]  Name: JAZIAH KWASNIK MRN: [id number] Date of Birth: 07-19-1970

## 2018-04-12 ENCOUNTER — Other Ambulatory Visit: Payer: Self-pay | Admitting: Family Medicine

## 2018-04-12 ENCOUNTER — Encounter: Payer: Self-pay | Admitting: Physical Therapy

## 2018-04-12 ENCOUNTER — Other Ambulatory Visit: Payer: Self-pay

## 2018-04-12 ENCOUNTER — Ambulatory Visit: Payer: PPO | Admitting: Physical Therapy

## 2018-04-12 DIAGNOSIS — M542 Cervicalgia: Secondary | ICD-10-CM

## 2018-04-12 DIAGNOSIS — R41841 Cognitive communication deficit: Secondary | ICD-10-CM | POA: Diagnosis not present

## 2018-04-12 DIAGNOSIS — R293 Abnormal posture: Secondary | ICD-10-CM

## 2018-04-12 DIAGNOSIS — R296 Repeated falls: Secondary | ICD-10-CM

## 2018-04-12 DIAGNOSIS — R2681 Unsteadiness on feet: Secondary | ICD-10-CM

## 2018-04-12 DIAGNOSIS — M6281 Muscle weakness (generalized): Secondary | ICD-10-CM

## 2018-04-12 DIAGNOSIS — R42 Dizziness and giddiness: Secondary | ICD-10-CM

## 2018-04-12 MED ORDER — NAPROXEN 500 MG PO TABS: 500.0000 mg | ORAL_TABLET | Freq: Two times a day (BID) | ORAL | 2 refills | Status: DC | PRN

## 2018-04-14 NOTE — Therapy (Signed)
[facility] [address] [place], [place], [address] Phone: [phone]   Fax:  [phone]  Physical Therapy Treatment  Patient Details  Name: [name] MRN: [id number] Date of Birth: December 23, 1970 Referring Provider (PT): Larey Seat, MD   Encounter Date: 04/12/2018  PT End of Session - 04/14/18 0948    Visit Number  18    Number of Visits  22    Date for PT Re-Evaluation  05/01/18    Authorization Type  $15 copay; [employer].  VL: follow Medicare guidelines.  10th visit PN    PT Start Time  [phone]   pt arrived late   PT Stop Time  0933    PT Time Calculation (min)  41 min    Activity Tolerance  Patient tolerated treatment well    Behavior During Therapy  [facility] for tasks assessed/performed       Past Medical History:  Diagnosis Date  . Achilles tendinitis   . Achilles tendinitis   . ADHD (attention deficit hyperactivity disorder)   . Allergy   . Arthritis   . Bipolar affective ([place])   . Bipolar disorder ([place])   . Cataracts, bilateral   . Eczema   . Family history of breast cancer   . Family history of colon cancer   . Family history of genetic disease carrier   . Ganglion cyst 09/29/2009   left wrist (2 cyst)  . Hyperprolactinemia ([place])   . Hypertension   . Lipoma   . Migraine   . Personality disorder ([place])   . Pes planus     Past Surgical History:  Procedure Laterality Date  . ANKLE SURGERY  12/88   left   . chest nodule  1990?   rt chest wall nodule removal  . GANGLION CYST EXCISION  2011  . lipoma removal    . right bunioectomy    . SHOULDER SURGERY  01/13/2011   right, partial tear  . tumor resection left thigh      There were no vitals filed for this visit.     04/12/18 0855  Symptoms/Limitations  Subjective L eye is red and painful today.  Sore from exercises yesterday.  Pertinent History OT for shoulder pain, PT at [facility] for hip; multiple falls, achilles  tendonitis, ADHD, OA, bipolar disorder, cataracts bilaterally, eczema, HTN, lipoma with removal, migraine, personality disorder, suicide ideation, L ankle surgery, R bunionectomy, R shoulder surgery and L thigh tumor resection  Patient Stated Goals To be more steady and to improve neck ROM  Pain Assessment  Currently in Pain? Yes  Pain Onset Today       04/12/18 0930  Balance Exercises: Standing  Rockerboard Anterior/posterior;Lateral;Head turns;EO;EC;10 seconds;10 reps;Intermittent UE support (weight shifting EO/EC, head turns/nods EO.  Head still EC)                 04/12/18 0856  Vestibular Treatment/Exercise  Vestibular Treatment Provided Gaze  Gaze Exercises X1 Viewing Horizontal;X1 Viewing Vertical  X1 Viewing Horizontal  Foot Position standing feet apart  Reps 3  Comments 30 to 60 sec.  Mild symptoms  X1 Viewing Vertical  Foot Position standing feet apart  Reps 3  Comments 30 to 40 sec - unable to do a full 60 sec vertical.  Mild symptoms       04/12/18 0931  PT Education  Education provided Yes  Education Details progressed x 1 viewing  Person(s) Educated Patient  Methods Explanation  Comprehension Verbalized  understanding                 PT Short Term Goals - 04/05/18 1308      PT SHORT TERM GOAL #1   Title  = LTG         PT Long Term Goals - 03/29/18 1040      PT LONG TERM GOAL #1   Title  Pt will be independent with vestibular, balance, neck/posture HEP      Time  4    Period  Weeks    Status  Revised    Target Date  05/01/18      PT LONG TERM GOAL #2   Title  Pt will demonstrate 10-12 deg increase in cervical spine ROM and </= 2/10 pain with movement    Baseline  painful with rotation, 2/10 -strain.  ROM decreased today    Time  4    Period  Weeks    Status  Revised    Target Date  05/01/18      PT LONG TERM GOAL #3   Title  Pt will demonstrate L hand strength within 5-8 lb of R hand strength and reports 25%  reduction in episodes of dropping items from LUE    Baseline  15 lb difference    Time  3    Period  Weeks    Status  New    Target Date  05/01/18      PT LONG TERM GOAL #4   Title  Pt will improve FGA to >/=25/30 to decrease falls risk during gait    Baseline  22/30    Time  4    Period  Weeks    Status  Revised    Target Date  05/01/18      PT LONG TERM GOAL #5   Title  To indicate improved use of VOR pt will tolerate performing VOR x1 viewing in standing x 60 seconds with wide and narrow BOS without UE support    Baseline  seated x 30 seconds    Time  4    Period  Weeks    Status  Revised    Target Date  05/01/18            Plan - 04/14/18 0949    Clinical Impression Statement  Due to soreness from exercises yesterday did not continue postural exercises today.  Instead continued focus on progression of gaze adaptation in standing and balance, weight shift training for balance reactions on rockerboard.  Pt required max tactile and verbal cues for use of hip strategy to self correct LOB.  Will continue to address and progress towards LTG.    Rehab Potential  Good    PT Frequency  2x / week    PT Duration  4 weeks    PT Treatment/Interventions  ADLs/Self Care Home Management;Cryotherapy;Lobbyist;Therapeutic exercise;Therapeutic activities;Functional mobility training;Patient/family education;Manual techniques;Canalith Repostioning;Moist Heat;Gait training;Neuromuscular re-education;Passive range of motion;Dry needling;Vestibular;Stair training    PT Next Visit Plan  Progress x 1 viewing in standing.  Try posture exercises in standing.  balance reaction training/rockerboard.  Print & give final HEP handout; review posture while sitting at computer/[employer]. TDN of upper trap, levator, pec.  Continue stretches.      PT Home Exercise Plan  [email or web address]    Consulted and Agree with Plan of Care  Patient       Patient will benefit from skilled therapeutic  intervention in order to improve the following deficits and  impairments:  Decreased range of motion, Difficulty walking, Increased muscle spasms, Pain, Decreased balance, Impaired flexibility, Postural dysfunction, Decreased strength, Decreased mobility, Dizziness, Decreased cognition, Impaired UE functional use  Visit Diagnosis: Cervicalgia  Dizziness and giddiness  Unsteadiness on feet  Repeated falls  Muscle weakness (generalized)  Abnormal posture     Problem List Patient Active Problem List   Diagnosis Date Noted  . 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 Genetic testing 02/27/2018  . Family history of genetic disease carrier   . Family history of breast cancer   . Family history of colon cancer   . Head injury with loss of consciousness ([place]) 11/20/2017  . Nausea 11/20/2017  . 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 Intractable persistent migraine aura with cerebral infarction and status migrainosus ([place]) 11/20/2017  . Tremor observed on examination 11/20/2017  . Left shoulder pain 02/18/2017  . MDD (major depressive disorder), recurrent severe, without psychosis ([place]) 12/28/2016  . Muscle strain 09/19/2016  . Right foot injury, subsequent encounter 08/25/2016  . Low back pain 06/27/2016  . Left wrist injury, subsequent encounter 06/27/2016  . Pain of left thumb 10/07/2015  . 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 Strain of left thumb 02/11/2015  . Strain of right forearm 02/11/2015  . Right shoulder pain 12/31/2014  . Injury of right little finger 12/31/2014  . Episodic cluster headache, 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  . 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 Hypersomnia, recurrent 12/02/2014  . Migraine aura, persistent, intractable, with status migrainosus 12/02/2014  . Lower back injury 09/30/2014  . Bipolar I disorder, most recent episode depressed ([place])   . MDD (major depressive  disorder), recurrent, severe, with psychosis ([place]) 09/23/2014  . Injury of left index finger 08/20/2014  . Right ankle sprain 06/01/2014  . Contusion, multiple sites 06/01/2014  . Strain of right gastrocnemius muscle 06/01/2014  . Phonophobia 05/04/2014  . Photophobia of both eyes 05/04/2014  . Emotionally unstable borderline personality disorder ([place]) 05/04/2014  . Nausea with vomiting 05/04/2014  . Mixed bipolar I disorder ([place])   . Bipolar I disorder, most recent episode mixed ([place]) 04/18/2014  . Bipolar affective disorder, depressed, mild ([place]) 04/12/2014  . Suicidal ideation 04/12/2014  . Injury of right shoulder and upper arm 02/17/2014  . Left leg pain 06/03/2013  . Right hip pain 06/03/2013  . Migraine with status migrainosus 01/08/2013  . Personality disorder ([place])   . Chronic migraine 05/08/2012  . Contact dermatitis 11/27/2011  . Major depressive disorder, recurrent episode ([place]) 10/27/2011  . Generalized anxiety disorder 10/27/2011  . 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 Borderline personality disorder ([place]) 10/27/2011  . Right foot pain 09/28/2011  . 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 Ganglion cyst 09/29/2009  . PES PLANUS 07/01/2008  . BIPOLAR DISORDER UNSPECIFIED 06/09/2008    Rico Junker, PT, DPT 04/14/18    9:52 AM    [facility] [address] [place], [place], [address] Phone: [phone]   Fax:  [phone]  Name: [name] MRN: [id number] Date of Birth: Mar 04, 1970

## 2018-04-15 DIAGNOSIS — H04123 Dry eye syndrome of bilateral lacrimal glands: Secondary | ICD-10-CM | POA: Diagnosis not present

## 2018-04-15 DIAGNOSIS — H26493 Other secondary cataract, bilateral: Secondary | ICD-10-CM | POA: Diagnosis not present

## 2018-04-17 ENCOUNTER — Ambulatory Visit: Payer: PPO | Admitting: Physical Therapy

## 2018-04-17 ENCOUNTER — Encounter: Payer: Self-pay | Admitting: Physical Therapy

## 2018-04-17 DIAGNOSIS — R296 Repeated falls: Secondary | ICD-10-CM

## 2018-04-17 DIAGNOSIS — R42 Dizziness and giddiness: Secondary | ICD-10-CM

## 2018-04-17 DIAGNOSIS — M6281 Muscle weakness (generalized): Secondary | ICD-10-CM

## 2018-04-17 DIAGNOSIS — R293 Abnormal posture: Secondary | ICD-10-CM

## 2018-04-17 DIAGNOSIS — M542 Cervicalgia: Secondary | ICD-10-CM

## 2018-04-17 DIAGNOSIS — R2681 Unsteadiness on feet: Secondary | ICD-10-CM

## 2018-04-17 DIAGNOSIS — R41841 Cognitive communication deficit: Secondary | ICD-10-CM | POA: Diagnosis not present

## 2018-04-17 NOTE — Patient Instructions (Addendum)
Over Head Pull: Narrow Grip    On back, knees bent, feet flat, band across thighs, elbows straight but relaxed. Pull hands apart (start). Keeping elbows straight, bring arms up and over head, hands toward floor. Keep pull steady on band. Hold momentarily. Return slowly, keeping pull steady, back to start. Repeat _10__ times. Band color __green____    Side Pull: Double Arm    On back, knees bent, feet flat. Arms perpendicular to body, shoulder level, elbows straight but relaxed. Pull arms out to sides, elbows straight. Resistance band comes across collarbones, hands toward floor. Hold momentarily. Slowly return to starting position. Repeat _10__ times. Band color __green__    Sash    On back, knees bent, feet flat, left hand on left hip, right hand above left. Pull right arm DIAGONALLY (hip to shoulder) across chest. Bring right arm along head toward floor. Hold momentarily. Slowly return to starting position. Repeat _10__ times. Do with left arm 10 times. Band color _green_____     Shoulder Rotation: Double Arm    On back, knees bent, feet flat, elbows tucked at sides, bent 90, hands palms up. Pull hands apart and down toward floor, keeping elbows near sides. Hold momentarily. Slowly return to starting position. Repeat _10__ times. Band color __Green____   Access Code: [email or web address]  URL: [email or web address]  Date: 04/17/2018  Prepared by: Misty Stanley   Exercises  Romberg Stance on Foam Pad with Head Rotation - 10 reps - 2 sets - 1x daily - 5x weekly  Romberg Stance Eyes Closed on Foam Pad - 1 reps - 2 sets - 10-30 SECONDS hold - 1x daily - 5x weekly  Walking with Head Rotation - 4 reps - 1 sets - 1x daily - 5x weekly  Correct Seated Posture - 1x daily - 7x weekly  Seated Backward Shoulder Rolls - 10 reps - 2x daily - 7x weekly  Seated Upper Trapezius Stretch - 2 reps - 20-30 second hold - 2x daily - 7x weekly  Supine Chin Tuck - 5 reps - 2 sets - 6 second hold  - 1x daily - 7x weekly  Lying down: isometric cervical rotation - 10 reps - 1 sets - 6 second hold - 1x daily - 7x weekly  Lying down - cervical rotation with nod - 10 reps - 2 sets - 1x daily - 7x weekly  Seated Scalenes Stretch - 10 reps - 3 sets - 1x daily - 7x weekly  Standing Gaze Stabilization with Head Rotation - 3 sets - 60 seconds hold - 2x daily - 7x weekly  Standing Gaze Stabilization with Head Nod - 3 sets - 60 SECONDS hold - 1x daily - 7x weekly

## 2018-04-17 NOTE — Therapy (Signed)
[facility] [address] [place], [place], [address] Phone: [phone]   Fax:  [phone]  Physical Therapy Treatment  Patient Details  Name: [name] MRN: [id number] Date of Birth: 1970-08-28 Referring Provider (PT): Larey Seat, MD   Encounter Date: 04/17/2018  PT End of Session - 04/17/18 1023    Visit Number  19    Number of Visits  22    Date for PT Re-Evaluation  05/01/18    Authorization Type  $15 copay; [employer].  VL: follow Medicare guidelines.  10th visit PN    PT Start Time  0935    PT Stop Time  1015    PT Time Calculation (min)  40 min    Activity Tolerance  Patient tolerated treatment well    Behavior During Therapy  WFL for tasks assessed/performed       Past Medical History:  Diagnosis Date  . Achilles tendinitis   . Achilles tendinitis   . ADHD (attention deficit hyperactivity disorder)   . Allergy   . Arthritis   . Bipolar affective ([place])   . Bipolar disorder ([place])   . Cataracts, bilateral   . Eczema   . Family history of breast cancer   . Family history of colon cancer   . Family history of genetic disease carrier   . Ganglion cyst 09/29/2009   left wrist (2 cyst)  . Hyperprolactinemia ([place])   . Hypertension   . Lipoma   . Migraine   . Personality disorder ([place])   . Pes planus     Past Surgical History:  Procedure Laterality Date  . ANKLE SURGERY  12/88   left   . chest nodule  1990?   rt chest wall nodule removal  . GANGLION CYST EXCISION  2011  . lipoma removal    . right bunioectomy    . SHOULDER SURGERY  01/13/2011   right, partial tear  . tumor resection left thigh      There were no vitals filed for this visit.  Subjective Assessment - 04/17/18 0938    Subjective  [facility] has shut down.  Pt will get paid for 12 hours each week.  Does not want to decrease frequency at therapy or D/C early to conserve finances.      Pertinent History  OT for shoulder  pain, PT at [facility] for hip; multiple falls, achilles tendonitis, ADHD, OA, bipolar disorder, cataracts bilaterally, eczema, HTN, lipoma with removal, migraine, personality disorder, suicide ideation, L ankle surgery, R bunionectomy, R shoulder surgery and L thigh tumor resection    Patient Stated Goals  To be more steady and to improve neck ROM    Currently in Pain?  Yes    Pain Location  Shoulder    Pain Orientation  Right    Pain Descriptors / Indicators  Sharp    Pain Type  Acute pain    Pain Onset  Today    Aggravating Factors   raising UE above shoulder level                        Vestibular Treatment/Exercise - 04/17/18 0942      Vestibular Treatment/Exercise   Vestibular Treatment Provided  Gaze    Gaze Exercises  X1 Viewing Horizontal;X1 Viewing Vertical      X1 Viewing Horizontal   Foot Position  standing feet apart and then feet together    Reps  3  Comments  60 seconds.  With feet together required cues to decrease lateral flexion to R      X1 Viewing Vertical   Foot Position  standing feet apart    Reps  3    Comments  60 seconds with verbal cues for smooth vertical movement, pt noted to be rocking body back and forth as she fatigued         Balance Exercises - 04/17/18 1019      Balance Exercises: Standing   Standing Eyes Opened  Narrow base of support (BOS);Head turns;Foam/compliant surface;Other reps (comment)   2 SETS X 10 REPS HEAD NODS/TURNS   Standing Eyes Closed  Narrow base of support (BOS);Foam/compliant surface;10 secs   4 SETS X 10 SECONS        PT Education - 04/17/18 1021    Education provided  Yes    Education Details  progressed HEP and provided recommendations for performing balance exercises/stretches 3x/week, posture exercises in supine 2x/week    Person(s) Educated  Patient    Methods  Explanation;Demonstration;Handout    Comprehension  Verbalized understanding;Returned demonstration       PT Short Term Goals  - 04/05/18 1308      PT SHORT TERM GOAL #1   Title  = LTG         PT Long Term Goals - 03/29/18 1040      PT LONG TERM GOAL #1   Title  Pt will be independent with vestibular, balance, neck/posture HEP      Time  4    Period  Weeks    Status  Revised    Target Date  05/01/18      PT LONG TERM GOAL #2   Title  Pt will demonstrate 10-12 deg increase in cervical spine ROM and </= 2/10 pain with movement    Baseline  painful with rotation, 2/10 -strain.  ROM decreased today    Time  4    Period  Weeks    Status  Revised    Target Date  05/01/18      PT LONG TERM GOAL #3   Title  Pt will demonstrate L hand strength within 5-8 lb of R hand strength and reports 25% reduction in episodes of dropping items from LUE    Baseline  15 lb difference    Time  3    Period  Weeks    Status  New    Target Date  05/01/18      PT LONG TERM GOAL #4   Title  Pt will improve FGA to >/=25/30 to decrease falls risk during gait    Baseline  22/30    Time  4    Period  Weeks    Status  Revised    Target Date  05/01/18      PT LONG TERM GOAL #5   Title  To indicate improved use of VOR pt will tolerate performing VOR x1 viewing in standing x 60 seconds with wide and narrow BOS without UE support    Baseline  seated x 30 seconds    Time  4    Period  Weeks    Status  Revised    Target Date  05/01/18            Plan - 04/17/18 1023    Clinical Impression Statement  Treatment session focused on continued progression of gaze adaptation in standing and providing cues for technique and progressing corner balance exercises to  continue to challenge vestibular system for postural control.  Pt continues to demonstrate LOB to R side and cues for attention to maintain COG in midline.  Will continue to address in order to progress towards LTG.    Rehab Potential  Good    PT Frequency  2x / week    PT Duration  4 weeks    PT Treatment/Interventions  ADLs/Self Care Home  Management;Cryotherapy;Lobbyist;Therapeutic exercise;Therapeutic activities;Functional mobility training;Patient/family education;Manual techniques;Canalith Repostioning;Moist Heat;Gait training;Neuromuscular re-education;Passive range of motion;Dry needling;Vestibular;Stair training    PT Next Visit Plan  10th visit PN!  Plan to D/C next week?  Progress x 1 viewing in standing.  Try posture exercises in standing.  balance reaction training/rockerboard. TDN of upper trap, levator, pec.  Continue stretches.      PT Home Exercise Plan  [email or web address]    Consulted and Agree with Plan of Care  Patient       Patient will benefit from skilled therapeutic intervention in order to improve the following deficits and impairments:  Decreased range of motion, Difficulty walking, Increased muscle spasms, Pain, Decreased balance, Impaired flexibility, Postural dysfunction, Decreased strength, Decreased mobility, Dizziness, Decreased cognition, Impaired UE functional use  Visit Diagnosis: Cervicalgia  Dizziness and giddiness  Unsteadiness on feet  Repeated falls  Muscle weakness (generalized)  Abnormal posture     Problem List Patient Active Problem List   Diagnosis Date Noted  . 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 Genetic testing 02/27/2018  . Family history of genetic disease carrier   . Family history of breast cancer   . Family history of colon cancer   . Head injury with loss of consciousness ([place]) 11/20/2017  . Nausea 11/20/2017  . 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 Tremor observed on examination 11/20/2017  . Left shoulder pain 02/18/2017  . MDD (major depressive disorder), recurrent severe, without psychosis ([place]) 12/28/2016  . Muscle strain 09/19/2016  . Right foot injury, subsequent encounter 08/25/2016  . Low back pain 06/27/2016  .  Left wrist injury, subsequent encounter 06/27/2016  . Pain of left thumb 10/07/2015  . 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 Strain of left thumb 02/11/2015  . Strain of right forearm 02/11/2015  . Right shoulder pain 12/31/2014  . Injury of right little finger 12/31/2014  . Episodic cluster headache, 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  . 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 Hypersomnia, recurrent 12/02/2014  . Migraine aura, persistent, intractable, with status migrainosus 12/02/2014  . Lower back injury 09/30/2014  . Bipolar I disorder, most recent episode depressed ([place])   . MDD (major depressive disorder), recurrent, severe, with psychosis ([place]) 09/23/2014  . Injury of left index finger 08/20/2014  . Right ankle sprain 06/01/2014  . Contusion, multiple sites 06/01/2014  . Strain of right gastrocnemius muscle 06/01/2014  . Phonophobia 05/04/2014  . Photophobia of both eyes 05/04/2014  . Emotionally unstable borderline personality disorder ([place]) 05/04/2014  . Nausea with vomiting 05/04/2014  . Mixed bipolar I disorder ([place])   . Bipolar I disorder, most recent episode mixed ([place]) 04/18/2014  . Bipolar affective disorder, depressed, mild ([place]) 04/12/2014  . Suicidal ideation 04/12/2014  . Injury of right shoulder and upper arm 02/17/2014  . Left leg pain 06/03/2013  . Right hip pain 06/03/2013  . Migraine with status migrainosus 01/08/2013  . Personality disorder ([place])   .  Chronic migraine 05/08/2012  . Contact dermatitis 11/27/2011  . Major depressive disorder, recurrent episode ([place]) 10/27/2011  . Generalized anxiety disorder 10/27/2011  . 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 Borderline personality disorder ([place]) 10/27/2011  . Right foot pain 09/28/2011  . 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 Ganglion cyst  09/29/2009  . PES PLANUS 07/01/2008  . BIPOLAR DISORDER UNSPECIFIED 06/09/2008    Rico Junker, PT, DPT 04/17/18    10:31 AM    [facility] [address] [place], [place], [address] Phone: [phone]   Fax:  [phone]  Name: [name] MRN: [id number] Date of Birth: [birth year]

## 2018-04-19 ENCOUNTER — Ambulatory Visit: Payer: PPO | Admitting: Physical Therapy

## 2018-04-23 ENCOUNTER — Ambulatory Visit: Payer: PPO | Admitting: Physical Therapy

## 2018-04-25 ENCOUNTER — Ambulatory Visit: Payer: PPO | Admitting: Physical Therapy

## 2018-04-30 ENCOUNTER — Encounter: Payer: Self-pay | Admitting: Physical Therapy

## 2018-05-02 ENCOUNTER — Encounter: Payer: Self-pay | Admitting: Physical Therapy

## 2018-05-08 ENCOUNTER — Telehealth: Payer: Self-pay | Admitting: Occupational Therapy

## 2018-05-08 DIAGNOSIS — M47892 Other spondylosis, cervical region: Secondary | ICD-10-CM | POA: Diagnosis not present

## 2018-05-08 DIAGNOSIS — I2510 Atherosclerotic heart disease of native coronary artery without angina pectoris: Secondary | ICD-10-CM | POA: Diagnosis not present

## 2018-05-08 DIAGNOSIS — Z950 Presence of cardiac pacemaker: Secondary | ICD-10-CM | POA: Diagnosis not present

## 2018-05-08 DIAGNOSIS — Z7901 Long term (current) use of anticoagulants: Secondary | ICD-10-CM | POA: Diagnosis not present

## 2018-05-08 DIAGNOSIS — G629 Polyneuropathy, unspecified: Secondary | ICD-10-CM | POA: Diagnosis not present

## 2018-05-08 DIAGNOSIS — I447 Left bundle-branch block, unspecified: Secondary | ICD-10-CM | POA: Diagnosis not present

## 2018-05-08 DIAGNOSIS — I4819 Other persistent atrial fibrillation: Secondary | ICD-10-CM | POA: Diagnosis not present

## 2018-05-08 DIAGNOSIS — E785 Hyperlipidemia, unspecified: Secondary | ICD-10-CM | POA: Diagnosis not present

## 2018-05-08 DIAGNOSIS — M47812 Spondylosis without myelopathy or radiculopathy, cervical region: Secondary | ICD-10-CM | POA: Diagnosis not present

## 2018-05-08 DIAGNOSIS — M542 Cervicalgia: Secondary | ICD-10-CM | POA: Diagnosis not present

## 2018-05-08 DIAGNOSIS — Z882 Allergy status to sulfonamides status: Secondary | ICD-10-CM | POA: Diagnosis not present

## 2018-05-08 DIAGNOSIS — Z79899 Other long term (current) drug therapy: Secondary | ICD-10-CM | POA: Diagnosis not present

## 2018-05-08 DIAGNOSIS — Z885 Allergy status to narcotic agent status: Secondary | ICD-10-CM | POA: Diagnosis not present

## 2018-05-08 DIAGNOSIS — Z87891 Personal history of nicotine dependence: Secondary | ICD-10-CM | POA: Diagnosis not present

## 2018-05-08 DIAGNOSIS — I10 Essential (primary) hypertension: Secondary | ICD-10-CM | POA: Diagnosis not present

## 2018-05-08 DIAGNOSIS — K219 Gastro-esophageal reflux disease without esophagitis: Secondary | ICD-10-CM | POA: Diagnosis not present

## 2018-05-08 DIAGNOSIS — I495 Sick sinus syndrome: Secondary | ICD-10-CM | POA: Diagnosis not present

## 2018-05-08 NOTE — Telephone Encounter (Signed)
Pt was contacted today regarding temporary reduction of [facility] due to concerns for community transmission of COVID-19.  Patient identity was verified.    Assessed if patient needed to be seen in person by clinician (recent fall or acute injury that requires hands on assessment and advice, change in diet order, post-surgical, special cases, etc.).    Patient did not have an acute/special need that requires in person visit. Proceeded with phone call.  Therapist advised the patient to continue to perform HEP and assured pt had no unanswered questions or concerns at this time.  The patient expressed interest in being contacted for an E-Visit, virtual check in, or Telehealth visit to continue their plan of care, when those services become available.  [facility] will follow up with patient at that time.  Patient is aware we can be reached by telephone during limited business hours in the meantime.  [name], OTR/L [facility] [address] [place], [place]  [address] [phone] phone [phone] 05/08/18 3:48 PM

## 2018-05-10 ENCOUNTER — Telehealth: Payer: Self-pay | Admitting: Physical Therapy

## 2018-05-10 NOTE — Telephone Encounter (Signed)
Entered in error.  Pt scheduled for Telehealth.  Will need to verify insurance coverage for PT Telehealth visits.  Rico Junker, PT, DPT 05/10/18    12:21 PM

## 2018-05-16 ENCOUNTER — Ambulatory Visit: Payer: PPO | Admitting: Physical Therapy

## 2018-05-22 ENCOUNTER — Ambulatory Visit: Payer: PPO | Attending: Family Medicine | Admitting: Physical Therapy

## 2018-05-22 ENCOUNTER — Other Ambulatory Visit: Payer: Self-pay

## 2018-05-22 ENCOUNTER — Encounter: Payer: Self-pay | Admitting: Physical Therapy

## 2018-05-22 DIAGNOSIS — R42 Dizziness and giddiness: Secondary | ICD-10-CM | POA: Diagnosis present

## 2018-05-22 DIAGNOSIS — R296 Repeated falls: Secondary | ICD-10-CM | POA: Diagnosis not present

## 2018-05-22 DIAGNOSIS — M542 Cervicalgia: Secondary | ICD-10-CM | POA: Insufficient documentation

## 2018-05-22 DIAGNOSIS — R2681 Unsteadiness on feet: Secondary | ICD-10-CM | POA: Diagnosis not present

## 2018-05-22 DIAGNOSIS — M6281 Muscle weakness (generalized): Secondary | ICD-10-CM

## 2018-05-22 DIAGNOSIS — R293 Abnormal posture: Secondary | ICD-10-CM

## 2018-05-22 NOTE — Patient Instructions (Addendum)
Over Head Pull: Narrow Grip    On back, knees bent, feet flat, band across thighs, elbows straight but relaxed. Pull hands apart (start). Keeping elbows straight, bring arms up and over head, hands toward floor. Keep pull steady on band. Hold momentarily. Return slowly, keeping pull steady, back to start. Repeat _10__ times. Band color __green____    Side Pull: Double Arm    On back, knees bent, feet flat. Arms perpendicular to body, shoulder level, elbows straight but relaxed. Pull arms out to sides, elbows straight. Resistance band comes across collarbones, hands toward floor. Hold momentarily. Slowly return to starting position. Repeat _10__ times. Band color __green__    Sash    On back, knees bent, feet flat, left hand on left hip, right hand above left. Pull right arm DIAGONALLY (hip to shoulder) across chest. Bring right arm along head toward floor. Hold momentarily. Slowly return to starting position. Repeat _10__ times. Do with left arm 10 times. Band color _green_____     Shoulder Rotation: Double Arm    On back, knees bent, feet flat, elbows tucked at sides, bent 90, hands palms up. Pull hands apart and down toward floor, keeping elbows near sides. Hold momentarily. Slowly return to starting position. Repeat _10__ times. Band color __Green____   Access Code: [email or web address]  URL: [email or web address]  Date: 05/22/2018  Prepared by: Misty Stanley   Exercises  Romberg Stance on Foam Pad with Head Rotation - 10 reps - 2 sets - 1x daily - 5x weekly  Romberg Stance Eyes Closed on Foam Pad - 1 reps - 2 sets - 30 SECONDS hold - 1x daily - 5x weekly  Walking with Head Rotation - 4 reps - 1 sets - 1x daily - 5x weekly  Seated Upper Trapezius Stretch - 2 reps - 20-30 second hold - 2x daily - 7x weekly  Supine Chin Tuck - 5 reps - 2 sets - 6 second hold - 1x daily - 7x weekly  Seated Scalenes Stretch - 10 reps - 3 sets - 1x daily - 7x weekly  Standing Gaze  Stabilization with Head Rotation - 3 sets - 60 seconds hold - 2x daily - 7x weekly  Standing Gaze Stabilization with Head Nod - 3 sets - 60 SECONDS hold - 1x daily - 7x weekly

## 2018-05-22 NOTE — Therapy (Signed)
[facility] [address] [place], [place], [address] Phone: [phone]   Fax:  [phone]  Physical Therapy Treatment - 10th visit PN and D/C Summary  Patient Details  Name: [name] MRN: [id number] Date of Birth: 05-03-1970 Referring Provider (PT): Larey Seat, MD   Encounter Date: 05/22/2018  PT End of Session - 05/22/18 1439    Visit Number  20    Number of Visits  22    Date for PT Re-Evaluation  05/01/18    Authorization Type  $15 copay; [employer].  VL: follow Medicare guidelines.  10th visit PN    PT Start Time  1017    PT Stop Time  1100    PT Time Calculation (min)  43 min    Activity Tolerance  Patient tolerated treatment well    Behavior During Therapy  WFL for tasks assessed/performed       Past Medical History:  Diagnosis Date  . Achilles tendinitis   . Achilles tendinitis   . ADHD (attention deficit hyperactivity disorder)   . Allergy   . Arthritis   . Bipolar affective ([place])   . Bipolar disorder ([place])   . Cataracts, bilateral   . Eczema   . Family history of breast cancer   . Family history of colon cancer   . Family history of genetic disease carrier   . Ganglion cyst 09/29/2009   left wrist (2 cyst)  . Hyperprolactinemia ([place])   . Hypertension   . Lipoma   . Migraine   . Personality disorder ([place])   . Pes planus     Past Surgical History:  Procedure Laterality Date  . ANKLE SURGERY  12/88   left   . chest nodule  1990?   rt chest wall nodule removal  . GANGLION CYST EXCISION  2011  . lipoma removal    . right bunioectomy    . SHOULDER SURGERY  01/13/2011   right, partial tear  . tumor resection left thigh      There were no vitals filed for this visit.  Subjective Assessment - 05/22/18 1019    Subjective  Went to see the doctor about the crunching and cracking in her neck.  Physician felt it was part OA and part inflammation and started her on Mobic.  Pain  has improved.      Pertinent History  OT for shoulder pain, PT at [facility] for hip; multiple falls, achilles tendonitis, ADHD, OA, bipolar disorder, cataracts bilaterally, eczema, HTN, lipoma with removal, migraine, personality disorder, suicide ideation, L ankle surgery, R bunionectomy, R shoulder surgery and L thigh tumor resection    Patient Stated Goals  To be more steady and to improve neck ROM    Pain Score  0-No pain    Pain Onset  Today         [facility] PT Assessment - 05/22/18 1025      Assessment   Medical Diagnosis  Head Injury s/p fall    Referring Provider (PT)  Larey Seat, MD    Onset Date/Surgical Date  11/20/17    Hand Dominance  Right    Prior Therapy  yes outpatient OT for shoulder and PT for hip pain      Precautions   Precautions  Other (comment)    Precaution Comments  achilles tendonitis, ADHD, OA, bipolar disorder, cataracts bilaterally, eczema, HTN, lipoma with removal, migraine, personality disorder, suicide ideation, L ankle surgery, R bunionectomy, R shoulder surgery  and L thigh tumor resection      Prior Function   Level of Independence  Independent      Observation/Other Assessments   Focus on Therapeutic Outcomes (FOTO)   Not assessed      ROM / Strength   AROM / PROM / Strength  AROM;Strength      AROM   Overall AROM   Deficits    AROM Assessment Site  Cervical    Cervical Flexion  60    Cervical Extension  30    Cervical - Right Side Bend  50    Cervical - Left Side Bend  45    Cervical - Right Rotation  60    Cervical - Left Rotation  60      Strength   Overall Strength  Deficits    Strength Assessment Site  Hand    Right/Left hand  Right;Left    Right Hand Grip (lbs)  42    Left Hand Grip (lbs)  48      Functional Gait  Assessment   Gait assessed   Yes    Gait Level Surface  Walks 20 ft in less than 5.5 sec, no assistive devices, good speed, no evidence for imbalance, normal gait pattern, deviates no more than 6 in outside of  the 12 in walkway width.    Change in Gait Speed  Able to smoothly change walking speed without loss of balance or gait deviation. Deviate no more than 6 in outside of the 12 in walkway width.    Gait with Horizontal Head Turns  Performs head turns smoothly with no change in gait. Deviates no more than 6 in outside 12 in walkway width    Gait with Vertical Head Turns  Performs head turns with no change in gait. Deviates no more than 6 in outside 12 in walkway width.    Gait and Pivot Turn  Pivot turns safely within 3 sec and stops quickly with no loss of balance.    Step Over Obstacle  Is able to step over 2 stacked shoe boxes taped together (9 in total height) without changing gait speed. No evidence of imbalance.    Gait with Narrow Base of Support  Ambulates 4-7 steps.    Gait with Eyes Closed  Walks 20 ft, uses assistive device, slower speed, mild gait deviations, deviates 6-10 in outside 12 in walkway width. Ambulates 20 ft in less than 9 sec but greater than 7 sec.    Ambulating Backwards  Walks 20 ft, uses assistive device, slower speed, mild gait deviations, deviates 6-10 in outside 12 in walkway width.    Steps  Alternating feet, no rail.    Total Score  26    FGA comment:  26/30                    Vestibular Treatment/Exercise - 05/22/18 1040      Vestibular Treatment/Exercise   Vestibular Treatment Provided  Gaze      X1 Viewing Horizontal   Foot Position  standing feet apart and then feet together    Reps  2    Comments  60 seconds, continues to require intermittent cues to decrease shoulder rotation; required one finger touch to chair for balance with feet together      X1 Viewing Vertical   Foot Position  standing feet apart and then feet together    Reps  2    Comments  standing feet apart and then  feet together            PT Education - 05/22/18 1439    Education provided  Yes    Education Details  goals met, final HEP, D/C today    Person(s)  Educated  Patient    Methods  Explanation;Handout    Comprehension  Verbalized understanding       PT Short Term Goals - 04/05/18 1308      PT SHORT TERM GOAL #1   Title  = LTG         PT Long Term Goals - 05/22/18 1039      PT LONG TERM GOAL #1   Title  Pt will be independent with vestibular, balance, neck/posture HEP      Time  4    Period  Weeks    Status  Achieved      PT LONG TERM GOAL #2   Title  Pt will demonstrate 10-12 deg increase in cervical spine ROM and </= 2/10 pain with movement    Time  4    Period  Weeks    Status  Achieved      PT LONG TERM GOAL #3   Title  Pt will demonstrate L hand strength within 5-8 lb of R hand strength and reports 25% reduction in episodes of dropping items from LUE    Time  3    Period  Weeks    Status  Achieved      PT LONG TERM GOAL #4   Title  Pt will improve FGA to >/=25/30 to decrease falls risk during gait    Baseline  26/30    Time  4    Period  Weeks    Status  Achieved      PT LONG TERM GOAL #5   Title  To indicate improved use of VOR pt will tolerate performing VOR x1 viewing in standing x 60 seconds with wide and narrow BOS without UE support    Baseline  with feet together, still needs finger touch for balance/stability    Time  4    Period  Weeks    Status  Partially Met            Plan - 05/22/18 1439    Clinical Impression Statement  This reporting period includes treatment session from 02/26/2018 through 05/22/2018.   Treatment session focused on re-assessment of patient's progress towards goals.  Pt has been performing her HEP consistently over the past month while clinic has been closed due to COVID-19; upon reassessment today pt demonstrates significant improvement in neck pain, cervical spine ROM, and dynamic standing balance.  Pt has met 4/5 LTG; pt partially met final LTG.  Pt able to tolerate and perform x 1 viewing in standing with wide BOS without UE support for balance but when BOS is narrowed,  she requires one finger touch for increased stability and balance. Pt to continue to progress with gaze adaptation at home.  Reviewed final HEP recommendations; pt is ready to D/C today from PT.    Rehab Potential  Good    PT Frequency  2x / week    PT Duration  4 weeks    PT Treatment/Interventions  ADLs/Self Care Home Management;Cryotherapy;Lobbyist;Therapeutic exercise;Therapeutic activities;Functional mobility training;Patient/family education;Manual techniques;Canalith Repostioning;Moist Heat;Gait training;Neuromuscular re-education;Passive range of motion;Dry needling;Vestibular;Stair training    PT Next Visit Plan  D/C    PT Home Exercise Plan  [id number]    Consulted and Agree with Plan  of Care  Patient       Patient will benefit from skilled therapeutic intervention in order to improve the following deficits and impairments:  Decreased range of motion, Difficulty walking, Increased muscle spasms, Pain, Decreased balance, Impaired flexibility, Postural dysfunction, Decreased strength, Decreased mobility, Dizziness, Decreased cognition, Impaired UE functional use  Visit Diagnosis: Cervicalgia  Dizziness and giddiness  Unsteadiness on feet  Repeated falls  Muscle weakness (generalized)  Abnormal posture     Problem List Patient Active Problem List   Diagnosis Date Noted  . 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 Genetic testing 02/27/2018  . Family history of genetic disease carrier   . Family history of breast cancer   . Family history of colon cancer   . Head injury with loss of consciousness ([place]) 11/20/2017  . Nausea 11/20/2017  . 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 Tremor observed on examination 11/20/2017  . Left shoulder pain 02/18/2017  . MDD (major depressive disorder), recurrent severe, without psychosis ([place])  12/28/2016  . Muscle strain 09/19/2016  . Right foot injury, subsequent encounter 08/25/2016  . Low back pain 06/27/2016  . Left wrist injury, subsequent encounter 06/27/2016  . Pain of left thumb 10/07/2015  . 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 Strain of left thumb 02/11/2015  . Strain of right forearm 02/11/2015  . Right shoulder pain 12/31/2014  . Injury of right little finger 12/31/2014  . Episodic cluster headache, 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  . 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 Hypersomnia, recurrent 12/02/2014  . Migraine aura, persistent, intractable, with status migrainosus 12/02/2014  . Lower back injury 09/30/2014  . Bipolar I disorder, most recent episode depressed ([place])   . MDD (major depressive disorder), recurrent, severe, with psychosis ([place]) 09/23/2014  . Injury of left index finger 08/20/2014  . Right ankle sprain 06/01/2014  . Contusion, multiple sites 06/01/2014  . Strain of right gastrocnemius muscle 06/01/2014  . Phonophobia 05/04/2014  . Photophobia of both eyes 05/04/2014  . Emotionally unstable borderline personality disorder ([place]) 05/04/2014  . Nausea with vomiting 05/04/2014  . Mixed bipolar I disorder ([place])   . Bipolar I disorder, most recent episode mixed ([place]) 04/18/2014  . Bipolar affective disorder, depressed, mild ([place]) 04/12/2014  . Suicidal ideation 04/12/2014  . Injury of right shoulder and upper arm 02/17/2014  . Left leg pain 06/03/2013  . Right hip pain 06/03/2013  . Migraine with status migrainosus 01/08/2013  . Personality disorder ([place])   . Chronic migraine 05/08/2012  . Contact dermatitis 11/27/2011  . Major depressive disorder, recurrent episode ([place]) 10/27/2011  . Generalized anxiety disorder 10/27/2011  . 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 Borderline personality disorder ([place]) 10/27/2011  . Right foot pain  09/28/2011  . 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 Ganglion cyst 09/29/2009  . PES PLANUS 07/01/2008  . BIPOLAR DISORDER UNSPECIFIED 06/09/2008    PHYSICAL THERAPY DISCHARGE SUMMARY  Visits from Start of Care: 20  Current functional level related to goals / functional outcomes: See impression statement and LTG achievement above   Remaining deficits: Impaired balance   Education / Equipment: HEP  Plan: Patient agrees to discharge.  Patient goals were met. Patient is being discharged due to meeting the stated rehab goals.  ?????    Letta Moynahan  Fritz Pickerel, PT, DPT 05/22/18    3:00 PM   [facility] [address] [place], [place], [address] Phone: [phone]   Fax:  [phone]  Name: [name] MRN: [id number] Date of Birth: 08-13-1970

## 2018-06-06 DIAGNOSIS — H26492 Other secondary cataract, left eye: Secondary | ICD-10-CM | POA: Diagnosis not present

## 2018-06-20 DIAGNOSIS — H26491 Other secondary cataract, right eye: Secondary | ICD-10-CM | POA: Diagnosis not present

## 2018-07-02 DIAGNOSIS — H0100B Unspecified blepharitis left eye, upper and lower eyelids: Secondary | ICD-10-CM | POA: Diagnosis not present

## 2018-07-02 DIAGNOSIS — H0100A Unspecified blepharitis right eye, upper and lower eyelids: Secondary | ICD-10-CM | POA: Diagnosis not present

## 2018-07-03 DIAGNOSIS — M47812 Spondylosis without myelopathy or radiculopathy, cervical region: Secondary | ICD-10-CM | POA: Diagnosis not present

## 2018-07-03 DIAGNOSIS — M542 Cervicalgia: Secondary | ICD-10-CM | POA: Diagnosis not present

## 2018-07-29 DIAGNOSIS — G43001 Migraine without aura, not intractable, with status migrainosus: Secondary | ICD-10-CM | POA: Diagnosis not present

## 2018-07-29 DIAGNOSIS — Z0000 Encounter for general adult medical examination without abnormal findings: Secondary | ICD-10-CM | POA: Diagnosis not present

## 2018-07-29 DIAGNOSIS — I10 Essential (primary) hypertension: Secondary | ICD-10-CM | POA: Diagnosis not present

## 2018-07-29 DIAGNOSIS — F319 Bipolar disorder, unspecified: Secondary | ICD-10-CM | POA: Diagnosis not present

## 2018-07-31 ENCOUNTER — Ambulatory Visit: Payer: PPO | Admitting: Adult Health

## 2018-07-31 ENCOUNTER — Telehealth: Payer: Self-pay

## 2018-07-31 NOTE — Telephone Encounter (Signed)
Patient was a no call/no show for their appointment today.   

## 2018-08-05 ENCOUNTER — Ambulatory Visit: Payer: PPO | Admitting: Adult Health

## 2018-08-05 ENCOUNTER — Encounter: Payer: Self-pay | Admitting: Adult Health

## 2018-08-05 ENCOUNTER — Other Ambulatory Visit: Payer: Self-pay

## 2018-08-05 VITALS — BP 113/77 | HR 64 | Temp 98.0°F | Ht 70.0 in | Wt 257.6 lb

## 2018-08-05 DIAGNOSIS — R413 Other amnesia: Secondary | ICD-10-CM

## 2018-08-05 DIAGNOSIS — Z5181 Encounter for therapeutic drug level monitoring: Secondary | ICD-10-CM | POA: Diagnosis not present

## 2018-08-05 NOTE — Progress Notes (Signed)
PATIENT: [name] DOB: [birth year]  REASON FOR VISIT: follow up HISTORY FROM: patient  HISTORY OF PRESENT ILLNESS: Today 08/05/18:  [name] is a 48 year old female with a history of postconcussive syndrome and migraine headaches.  She reports that Emgality has worked well for her headaches.  She states that she continues to notice some problems with her memory.  This is since her concussion.  She states that she is seeing her psychiatrist weekly.  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  She is not back at work yet.  She returns today for follow-up.  HISTORY 01/02/18 :  [name] is a 48 year old female with a history of postconcussive syndrome and migraine headaches.  She returns today for follow-up.  She reports that since she started [facility] in November she has not had any headaches.  She has not yet taken her dose for December.  She reports that she does need a refill.  She still notes trouble with her gait and balance.  She reports that there are times that she continues to bump into things. She states that when she is driving sometimes she hovers over the line.  Overall she is satisfied with the improvement in her headaches.  She returns today for evaluation  REVIEW OF SYSTEMS: Out of a complete 14 system review of symptoms, the patient complains only of the following symptoms, and all other reviewed systems are negative.  See HPI  ALLERGIES: Allergies  Allergen Reactions  . Adhesive [Tape] Itching and Rash    Also reacted to Steri Strips and Band-Aids.  . Dilaudid [Hydromorphone Hcl] Itching  . Morphine Nausea And Vomiting  . Penicillins Hives    Has patient had a PCN reaction causing immediate rash, facial/tongue/throat swelling, SOB or lightheadedness with hypotension: YES Has patient had a PCN reaction causing severe rash involving mucus membranes or skin necrosis: NO Has patient had a PCN reaction that required hospitalization NO Has  patient had a PCN reaction occurring within the last 10 years:NO If all of the above answers are "NO", then may proceed with Cephalosporin use.  Marland Kitchen Percocet [Oxycodone-Acetaminophen] Itching  . Prednisone Hives  . Provera [Medroxyprogesterone Acetate] Other (See Comments)    Causes manic episodes  . Ultram [Tramadol Hcl] Itching    HOME MEDICATIONS: Outpatient Medications Prior to Visit  Medication Sig Dispense Refill  . clobetasol cream (TEMOVATE) 0.05 %   3  . divalproex (DEPAKOTE ER) 500 MG 24 hr tablet TAKE 2 TABLETS BY MOUTH AT BEDTIME FOR MOOD CONTROL 60 tablet 5  . Galcanezumab-gnlm (EMGALITY) 120 MG/ML SOAJ Inject 120 mg into the skin every 30 (thirty) days. 1 mL 11  . hydrOXYzine (VISTARIL) 50 MG capsule TK ONE C PO TID PRA  2  . lamoTRIgine (LAMICTAL) 150 MG tablet Take 300 mg by mouth at bedtime.  1  . levocetirizine (XYZAL) 5 MG tablet Take 5 mg by mouth every evening.  1  . lurasidone (LATUDA) 80 MG TABS tablet Take 1 tablet (80 mg total) by mouth daily with supper. For mood control 30 tablet 0  . metoprolol succinate (TOPROL-XL) 100 MG 24 hr tablet Take 1 tablet (100 mg total) by mouth daily. Take with or immediately following a meal. 30 tablet 0  . naproxen (NAPROSYN) 500 MG tablet Take 1 tablet (500 mg total) by mouth 2 (two) times daily as needed. 60 tablet 2  . promethazine (PHENERGAN) 25 MG tablet TK 1 T PO BID FOR 15 DAYS PRN  0  . SUMAtriptan Succinate Refill 6 MG/0.5ML SOCT Inject 0.5 ml SQ at the onset of migraine. May repeat in 2 hours if needed. 0.5 mL 5  . traZODone (DESYREL) 50 MG tablet Take 1 tablet (50 mg total) by mouth at bedtime as needed for sleep. (Patient taking differently: Take 100 mg by mouth. ) 30 tablet 0  . hydrochlorothiazide (HYDRODIURIL) 25 MG tablet Take 25 mg by mouth as needed.  1   Facility-Administered Medications Prior to Visit  Medication Dose Route Frequency Provider Last Rate Last Dose  . methylPREDNISolone acetate (DEPO-MEDROL)  injection 40 mg  40 mg Intra-articular Once Hudnall, Sharyn Lull, MD      . valproate (DEPACON) 1,000 mg in dextrose 5 % 50 mL IVPB  1,000 mg Intravenous Once Dohmeier, Asencion Partridge, MD        PAST MEDICAL HISTORY: Past Medical History:  Diagnosis Date  . Achilles tendinitis   . Achilles tendinitis   . ADHD (attention deficit hyperactivity disorder)   . Allergy   . Arthritis   . Bipolar affective ([place])   . Bipolar disorder ([place])   . Cataracts, bilateral   . Eczema   . Family history of breast cancer   . Family history of colon cancer   . Family history of genetic disease carrier   . Ganglion cyst 09/29/2009   left wrist (2 cyst)  . Hyperprolactinemia ([place])   . Hypertension   . Lipoma   . Migraine   . Personality disorder ([place])   . Pes planus     PAST SURGICAL HISTORY: Past Surgical History:  Procedure Laterality Date  . ANKLE SURGERY  12/88   left   . chest nodule  1990?   rt chest wall nodule removal  . GANGLION CYST EXCISION  2011  . lipoma removal    . right bunioectomy    . SHOULDER SURGERY  01/13/2011   right, partial tear  . tumor resection left thigh      FAMILY HISTORY: Family History  Problem Relation Age of Onset  . Hypertension Mother   . Hyperlipidemia Mother   . Breast cancer Mother 82       genetic testing- CHEK2 likely pathogenic variant  . Heart attack Father   . Heart disease Father   . Hypertension Father   . Bipolar disorder Father   . Diabetes Paternal Grandfather   . Heart disease Maternal Aunt   . Breast cancer Maternal Aunt 65  . Heart disease Maternal Grandmother   . Colon cancer Maternal Grandmother 66  . Colon cancer Maternal Uncle 75  . Lymphoma Maternal Grandfather 69  . Breast cancer Maternal Aunt 65  . Breast cancer Maternal Aunt 35  . Breast cancer Maternal Aunt   . Bone cancer Cousin 30  . Cervical cancer Cousin        Genetic testing- 'was positive for a gene' had a mastectomy    SOCIAL HISTORY: Social History    Socioeconomic History  . Marital status: Single    Spouse name: Not on file  . Number of children: 0  . Years of education: Not on file  . Highest education level: Not on file  Occupational History    Employer: [employer]  . Financial resource strain: Not on file  . Food insecurity    Worry: Not on file    Inability: Not on file  . Transportation needs    Medical: Not on file    Non-medical: Not on file  Tobacco Use  . Smoking status: Never Smoker  . Smokeless tobacco: Never Used  Substance and Sexual Activity  . Alcohol use: No    Alcohol/week: 0.0 standard drinks  . Drug use: No  . Sexual activity: Never    Birth control/protection: Pill  Lifestyle  . Physical activity    Days per week: Not on file    Minutes per session: Not on file  . Stress: Not on file  Relationships  . Social Herbalist on phone: Not on file    Gets together: Not on file    Attends religious service: Not on file    Active member of club or organization: Not on file    Attends meetings of clubs or organizations: Not on file    Relationship status: Not on file  . Intimate partner violence    Fear of current or ex partner: Not on file    Emotionally abused: Not on file    Physically abused: Not on file    Forced sexual activity: Not on file  Other Topics Concern  . Not on file  Social History Narrative   Caffeine  2 sodas daily, 1 cup coffee daily.      PHYSICAL EXAM  Vitals:   08/05/18 1353  BP: 113/77  Pulse: 64  Temp: 98 F (36.7 C)  Weight: 257 lb 9.6 oz (116.8 kg)  Height: 5' 10"  (1.778 m)   Body mass index is 36.96 kg/m.   MMSE - Mini Mental State Exam 08/05/2018  Orientation to time 5  Orientation to Place 5  Registration 3  Attention/ Calculation 5  Recall 2  Language- name 2 objects 2  Language- repeat 1  Language- follow 3 step command 3  Language- read & follow direction 1  Write a sentence 1  Copy design 1  Copy design-comments Named 7  animals  Total score 29     Generalized: Well developed, in no acute distress   Neurological examination  Mentation: Alert oriented to time, place, history taking. Follows all commands speech and language fluent Cranial nerve II-XII: Pupils were equal round reactive to light. Extraocular movements were full, visual field were full on confrontational test. Facial sensation and strength were normal. Uvula tongue midline. Head turning and shoulder shrug  were normal and symmetric. Motor: The motor testing reveals 5 over 5 strength of all 4 extremities. Good symmetric motor tone is noted throughout.  Sensory: Sensory testing is intact to soft touch on all 4 extremities. No evidence of extinction is noted.  Coordination: Cerebellar testing reveals good finger-nose-finger and heel-to-shin bilaterally.  Gait and station: Gait is normal.  Reflexes: Deep tendon reflexes are symmetric and normal bilaterally.   DIAGNOSTIC DATA (LABS, IMAGING, TESTING) - I reviewed patient records, labs, notes, testing and imaging myself where available.  Lab Results  Component Value Date   WBC 7.4 12/29/2016   HGB 15.2 (H) 12/29/2016   HCT 44.6 12/29/2016   MCV 93.9 12/29/2016   PLT 241 12/29/2016      Component Value Date/Time   NA 140 12/29/2016 0627   NA 143 02/21/2016 1017   K 3.9 12/29/2016 0627   CL 104 12/29/2016 0627   CO2 28 12/29/2016 0627   GLUCOSE 88 12/29/2016 0627   BUN 16 12/29/2016 0627   BUN 10 02/21/2016 1017   CREATININE 1.09 (H) 12/29/2016 0627   CREATININE 0.83 09/11/2011 1524   CALCIUM 9.5 12/29/2016 0627   PROT 6.3 (L) 12/29/2016 3845  PROT 5.6 (L) 02/21/2016 1017   ALBUMIN 3.8 12/29/2016 0627   ALBUMIN 3.9 02/21/2016 1017   AST 27 12/29/2016 0627   ALT 14 12/29/2016 0627   ALKPHOS 60 12/29/2016 0627   BILITOT 0.6 12/29/2016 0627   BILITOT 0.5 02/21/2016 1017   GFRNONAA 59 (L) 12/29/2016 0627   GFRAA >60 12/29/2016 0627   Lab Results  Component Value Date   CHOL 198  12/29/2016   HDL 53 12/29/2016   LDLCALC 118 (H) 12/29/2016   TRIG 136 12/29/2016   CHOLHDL 3.7 12/29/2016   Lab Results  Component Value Date   HGBA1C 5.1 12/29/2016   No results found for: [id number] Lab Results  Component Value Date   TSH 4.288 12/29/2016      ASSESSMENT AND PLAN 48 y.o. year old female  has a past medical history of Achilles tendinitis, Achilles tendinitis, ADHD (attention deficit hyperactivity disorder), Allergy, Arthritis, Bipolar affective ([place]), Bipolar disorder ([place]), Cataracts, bilateral, Eczema, Family history of breast cancer, Family history of colon cancer, Family history of genetic disease carrier, Ganglion cyst (09/29/2009), Hyperprolactinemia ([place]), Hypertension, Lipoma, Migraine, Personality disorder ([place]), and Pes planus. here with:  1.  Migraines 2.  Memory disturbance d/t postconcussive syndrome  The patient will continue on Emgality.  Her memory score today is 29/30.  We will continue to monitor.  The patient is advised that if her symptoms worsen or she develops new symptoms she should let us know.  She will follow-up in 6 months or sooner if needed.  Ward Givens, MSN, NP-C 08/05/2018, 2:31 PM [facility] [address], [name], [place] [address] [phone]

## 2018-08-05 NOTE — Patient Instructions (Signed)
Your Plan:  Continue Emgality and Depakote Blood work today If your symptoms worsen or you develop new symptoms please let us know.    Thank you for coming to see [place] at [facility]. I hope we have been able to provide you high quality care today.  You may receive a patient satisfaction survey over the next few weeks. We would appreciate your feedback and comments so that we may continue to improve ourselves and the health of our patients.

## 2018-08-06 LAB — VALPROIC ACID LEVEL: Valproic Acid Lvl: 80 ug/mL (ref 50–100)

## 2018-08-06 LAB — COMPREHENSIVE METABOLIC PANEL
ALT: 9 IU/L (ref 0–32)
AST: 13 IU/L (ref 0–40)
Albumin/Globulin Ratio: 2.2 (ref 1.2–2.2)
Albumin: 4.2 g/dL (ref 3.8–4.8)
Alkaline Phosphatase: 68 IU/L (ref 39–117)
BUN/Creatinine Ratio: 13 (ref 9–23)
BUN: 15 mg/dL (ref 6–24)
Bilirubin Total: 0.4 mg/dL (ref 0.0–1.2)
CO2: 23 mmol/L (ref 20–29)
Calcium: 10.1 mg/dL (ref 8.7–10.2)
Chloride: 104 mmol/L (ref 96–106)
Creatinine, Ser: 1.17 mg/dL — ABNORMAL HIGH (ref 0.57–1.00)
GFR calc Af Amer: 64 mL/min/{1.73_m2} (ref >59)
GFR calc non Af Amer: 55 mL/min/{1.73_m2} — ABNORMAL LOW (ref >59)
Globulin, Total: 1.9 g/dL (ref 1.5–4.5)
Glucose: 105 mg/dL — ABNORMAL HIGH (ref 65–99)
Potassium: 5.3 mmol/L — ABNORMAL HIGH (ref 3.5–5.2)
Sodium: 140 mmol/L (ref 134–144)
Total Protein: 6.1 g/dL (ref 6.0–8.5)

## 2018-08-06 LAB — CBC WITH DIFFERENTIAL/PLATELET
Basophils Absolute: 0.1 10*3/uL (ref 0.0–0.2)
Basos: 1 %
EOS (ABSOLUTE): 0.1 10*3/uL (ref 0.0–0.4)
Eos: 2 %
Hematocrit: 40.1 % (ref 34.0–46.6)
Hemoglobin: 14 g/dL (ref 11.1–15.9)
Immature Grans (Abs): 0 10*3/uL (ref 0.0–0.1)
Immature Granulocytes: 0 %
Lymphocytes Absolute: 2.5 10*3/uL (ref 0.7–3.1)
Lymphs: 36 %
MCH: 31.5 pg (ref 26.6–33.0)
MCHC: 34.9 g/dL (ref 31.5–35.7)
MCV: 90 fL (ref 79–97)
Monocytes Absolute: 0.7 10*3/uL (ref 0.1–0.9)
Monocytes: 10 %
Neutrophils Absolute: 3.6 10*3/uL (ref 1.4–7.0)
Neutrophils: 51 %
Platelets: 205 10*3/uL (ref 150–450)
RBC: 4.44 x10E6/uL (ref 3.77–5.28)
RDW: 12.4 % (ref 11.7–15.4)
WBC: 7 10*3/uL (ref 3.4–10.8)

## 2018-08-07 ENCOUNTER — Telehealth: Payer: Self-pay | Admitting: *Deleted

## 2018-08-07 NOTE — Telephone Encounter (Addendum)
LMVm for pt that her lab results are consistent with her previous lab work.  Creatinine level is elevated.  Will forward to your pcp as well.  She is to call back if questions. Fax confirmation received Dr. [name] [phone].

## 2018-08-07 NOTE — Telephone Encounter (Signed)
-----   Message from Ward Givens, NP sent at 08/06/2018 10:09 AM EDT ----- Blood work is consistent with previous blood work.  Creatinine is elevated.  Please send a copy to her primary care provider.

## 2018-08-29 DIAGNOSIS — Z961 Presence of intraocular lens: Secondary | ICD-10-CM | POA: Diagnosis not present

## 2018-08-29 DIAGNOSIS — H43812 Vitreous degeneration, left eye: Secondary | ICD-10-CM | POA: Diagnosis not present

## 2018-08-29 DIAGNOSIS — H5310 Unspecified subjective visual disturbances: Secondary | ICD-10-CM | POA: Diagnosis not present

## 2018-09-02 ENCOUNTER — Telehealth: Payer: Self-pay | Admitting: Adult Health

## 2018-09-02 MED ORDER — DIVALPROEX SODIUM ER 500 MG PO TB24: ORAL_TABLET | ORAL | 5 refills | Status: DC

## 2018-09-02 NOTE — Telephone Encounter (Signed)
Pt is requesting a refill of divalproex (DEPAKOTE ER) 500 MG 24 hr tablet, to be sent to [facility], [place]

## 2018-09-10 DIAGNOSIS — I10 Essential (primary) hypertension: Secondary | ICD-10-CM | POA: Diagnosis not present

## 2018-09-10 DIAGNOSIS — F3130 Bipolar disorder, current episode depressed, mild or moderate severity, unspecified: Secondary | ICD-10-CM | POA: Diagnosis not present

## 2018-09-11 ENCOUNTER — Ambulatory Visit
Admission: RE | Admit: 2018-09-11 | Discharge: 2018-09-11 | Disposition: A | Discharge status: Home / Self Care | Payer: PPO | Source: Ambulatory Visit | Attending: Family Medicine | Admitting: Family Medicine

## 2018-09-11 ENCOUNTER — Other Ambulatory Visit: Payer: Self-pay

## 2018-09-11 ENCOUNTER — Ambulatory Visit: Payer: PPO | Admitting: Family Medicine

## 2018-09-11 VITALS — BP 128/62 | Ht 70.0 in | Wt 254.0 lb

## 2018-09-11 DIAGNOSIS — S4992XA Unspecified injury of left shoulder and upper arm, initial encounter: Secondary | ICD-10-CM

## 2018-09-11 DIAGNOSIS — M25512 Pain in left shoulder: Secondary | ICD-10-CM

## 2018-09-11 DIAGNOSIS — M19012 Primary osteoarthritis, left shoulder: Secondary | ICD-10-CM | POA: Diagnosis not present

## 2018-09-11 NOTE — Patient Instructions (Signed)
You suffered a shoulder subluxation. Wear sling regularly for next 2 weeks. Ok to come out of this at least twice a day to do simple motion exercises we discussed, move your elbow. Icing 15 minutes at a time 3-4 times a day. Meloxicam 15mg  daily with food for pain and inflammation. We will talk about you doing physical therapy starting in 2 weeks - for this and your neck. Follow up with me in 2 weeks.

## 2018-09-12 ENCOUNTER — Encounter: Payer: Self-pay | Admitting: Family Medicine

## 2018-09-12 NOTE — Progress Notes (Signed)
PCP: Aretta Nip, MD  Subjective:   HPI: Patient is a 48 y.o. female here for left shoulder pain.  Patient reports about 1 week ago she fell down on concrete steps with left arm catching herself on the step. Immediate pain in deep left shoulder that is sharp and can be severe, worse with motion. Feels like it's not in the right place. Taken ibuprofen which helps some. No skin changes, swelling, numbness.  Past Medical History:  Diagnosis Date  . Achilles tendinitis   . Achilles tendinitis   . ADHD (attention deficit hyperactivity disorder)   . Allergy   . Arthritis   . Bipolar affective ([place])   . Bipolar disorder ([place])   . Cataracts, bilateral   . Eczema   . Family history of breast cancer   . Family history of colon cancer   . Family history of genetic disease carrier   . Ganglion cyst 09/29/2009   left wrist (2 cyst)  . Hyperprolactinemia ([place])   . Hypertension   . Lipoma   . Migraine   . Personality disorder ([place])   . Pes planus     Current Outpatient Medications on File Prior to Visit  Medication Sig Dispense Refill  . clobetasol cream (TEMOVATE) 0.05 %   3  . divalproex (DEPAKOTE ER) 500 MG 24 hr tablet TAKE 2 TABLETS BY MOUTH AT BEDTIME FOR MOOD CONTROL 60 tablet 5  . Galcanezumab-gnlm (EMGALITY) 120 MG/ML SOAJ Inject 120 mg into the skin every 30 (thirty) days. 1 mL 11  . hydrOXYzine (VISTARIL) 50 MG capsule TK ONE C PO TID PRA  2  . lamoTRIgine (LAMICTAL) 150 MG tablet Take 300 mg by mouth at bedtime.  1  . levocetirizine (XYZAL) 5 MG tablet Take 5 mg by mouth every evening.  1  . lurasidone (LATUDA) 80 MG TABS tablet Take 1 tablet (80 mg total) by mouth daily with supper. For mood control 30 tablet 0  . meloxicam (MOBIC) 15 MG tablet TK 1 T PO QD    . metoprolol succinate (TOPROL-XL) 100 MG 24 hr tablet Take 1 tablet (100 mg total) by mouth daily. Take with or immediately following a meal. 30 tablet 0  . promethazine (PHENERGAN) 25 MG tablet TK 1 T PO  BID FOR 15 DAYS PRN  0  . SUMAtriptan Succinate Refill 6 MG/0.5ML SOCT Inject 0.5 ml SQ at the onset of migraine. May repeat in 2 hours if needed. 0.5 mL 5  . traZODone (DESYREL) 50 MG tablet Take 1 tablet (50 mg total) by mouth at bedtime as needed for sleep. (Patient taking differently: Take 100 mg by mouth. ) 30 tablet 0   Current Facility-Administered Medications on File Prior to Visit  Medication Dose Route Frequency Provider Last Rate Last Dose  . methylPREDNISolone acetate (DEPO-MEDROL) injection 40 mg  40 mg Intra-articular Once Kierre Deines, Sharyn Lull, MD      . valproate (DEPACON) 1,000 mg in dextrose 5 % 50 mL IVPB  1,000 mg Intravenous Once Dohmeier, Asencion Partridge, MD        Past Surgical History:  Procedure Laterality Date  . ANKLE SURGERY  12/88   left   . chest nodule  1990?   rt chest wall nodule removal  . GANGLION CYST EXCISION  2011  . lipoma removal    . right bunioectomy    . SHOULDER SURGERY  01/13/2011   right, partial tear  . tumor resection left thigh      Allergies  Allergen  Reactions  . Adhesive [Tape] Itching and Rash    Also reacted to Steri Strips and Band-Aids.  . Dilaudid [Hydromorphone Hcl] Itching  . Morphine Nausea And Vomiting  . Penicillins Hives    Has patient had a PCN reaction causing immediate rash, facial/tongue/throat swelling, SOB or lightheadedness with hypotension: YES Has patient had a PCN reaction causing severe rash involving mucus membranes or skin necrosis: NO Has patient had a PCN reaction that required hospitalization NO Has patient had a PCN reaction occurring within the last 10 years:NO If all of the above answers are "NO", then may proceed with Cephalosporin use.  Marland Kitchen Percocet [Oxycodone-Acetaminophen] Itching  . Prednisone Hives  . Provera [Medroxyprogesterone Acetate] Other (See Comments)    Causes manic episodes  . Ultram [Tramadol Hcl] Itching    Social History   Socioeconomic History  . Marital status: Single    Spouse name:  Not on file  . Number of children: 0  . Years of education: Not on file  . Highest education level: Not on file  Occupational History    Employer: [employer]  . Financial resource strain: Not on file  . Food insecurity    Worry: Not on file    Inability: Not on file  . Transportation needs    Medical: Not on file    Non-medical: Not on file  Tobacco Use  . Smoking status: Never Smoker  . Smokeless tobacco: Never Used  Substance and Sexual Activity  . Alcohol use: No    Alcohol/week: 0.0 standard drinks  . Drug use: No  . Sexual activity: Never    Birth control/protection: Pill  Lifestyle  . Physical activity    Days per week: Not on file    Minutes per session: Not on file  . Stress: Not on file  Relationships  . Social Herbalist on phone: Not on file    Gets together: Not on file    Attends religious service: Not on file    Active member of club or organization: Not on file    Attends meetings of clubs or organizations: Not on file    Relationship status: Not on file  . Intimate partner violence    Fear of current or ex partner: Not on file    Emotionally abused: Not on file    Physically abused: Not on file    Forced sexual activity: Not on file  Other Topics Concern  . Not on file  Social History Narrative   Caffeine  2 sodas daily, 1 cup coffee daily.    Family History  Problem Relation Age of Onset  . Hypertension Mother   . Hyperlipidemia Mother   . 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 Heart attack Father   . Heart disease Father   . Hypertension Father   . Bipolar disorder Father   . Diabetes Paternal Grandfather   . Heart disease Maternal Aunt   . Breast cancer Maternal Aunt 65  . Heart disease Maternal Grandmother   . Colon cancer Maternal Grandmother 66  . Colon cancer Maternal Uncle 75  . Lymphoma Maternal Grandfather 79  . Breast cancer Maternal Aunt 65  . Breast cancer  Maternal Aunt 35  . Breast cancer Maternal Aunt   . Bone cancer Cousin 56  . 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     Objective:  Physical Exam:  Gen: NAD, comfortable in exam room  Left shoulder: No swelling, ecchymoses.  No gross deformity. No TTP AC joint, biceps tendon. FROM passively - actively can abduct and flex to 90 degrees with pain.  Full IR and ER. Negative Hawkins, Neers. Negative Yergasons. Strength 5/5 with empty can and resisted internal/external rotation. Pain with apprehension.  Negative sulcus. NV intact distally.  Right shoulder: No swelling, ecchymoses.  No gross deformity. No TTP. FROM. Strength 5/5 with empty can and resisted internal/external rotation. NV intact distally.   Assessment & Plan:  1. Left shoulder injury - independently reviewed radiographs and no fracture, hill-sachs, other abnormalities.  Description, injury consistent with shoulder subluxation.  Sling for next 2 weeks though reviewed motion exercises.  Icing, meloxicam.  Consider adding PT at follow-up.  F/u in 2 weeks.

## 2018-09-23 DIAGNOSIS — L258 Unspecified contact dermatitis due to other agents: Secondary | ICD-10-CM | POA: Diagnosis not present

## 2018-09-25 ENCOUNTER — Other Ambulatory Visit: Payer: Self-pay

## 2018-09-25 ENCOUNTER — Encounter: Payer: Self-pay | Admitting: Family Medicine

## 2018-09-25 ENCOUNTER — Ambulatory Visit (INDEPENDENT_AMBULATORY_CARE_PROVIDER_SITE_OTHER): Payer: PPO | Admitting: Family Medicine

## 2018-09-25 VITALS — BP 112/80 | Wt 254.0 lb

## 2018-09-25 DIAGNOSIS — M25512 Pain in left shoulder: Secondary | ICD-10-CM | POA: Diagnosis not present

## 2018-09-25 NOTE — Patient Instructions (Signed)
You suffered a shoulder subluxation. Wear sling only rarely now if possible. Ok to come out of this at least twice a day to do simple motion exercises we discussed, move your elbow. Icing 15 minutes at a time 3-4 times a day. Meloxicam 15mg  daily with food for pain and inflammation. Start physical therapy. If not improving give me a call otherwise follow up with me in 4 weeks. If not improving we will go ahead with an MRI arthrogram of your shoulder.

## 2018-09-25 NOTE — Progress Notes (Signed)
PCP: Aretta Nip, MD  Subjective:   HPI: Patient is a 48 y.o. female here for left shoulder pain.  8/12: Patient reports about 1 week ago she fell down on concrete steps with left arm catching herself on the step. Immediate pain in deep left shoulder that is sharp and can be severe, worse with motion. Feels like it's not in the right place. Taken ibuprofen which helps some. No skin changes, swelling, numbness.  8/26: Patient reports she feels about the same compared to last visit. Has been using sling regularly. Taking meloxicam as needed. Shoulder still feels like it's going to slip out of place. No skin changes.  Past Medical History:  Diagnosis Date  . Achilles tendinitis   . Achilles tendinitis   . ADHD (attention deficit hyperactivity disorder)   . Allergy   . Arthritis   . Bipolar affective ([place])   . Bipolar disorder ([place])   . Cataracts, bilateral   . Eczema   . Family history of breast cancer   . Family history of colon cancer   . Family history of genetic disease carrier   . Ganglion cyst 09/29/2009   left wrist (2 cyst)  . Hyperprolactinemia ([place])   . Hypertension   . Lipoma   . Migraine   . Personality disorder ([place])   . Pes planus     Current Outpatient Medications on File Prior to Visit  Medication Sig Dispense Refill  . clobetasol cream (TEMOVATE) 0.05 %   3  . divalproex (DEPAKOTE ER) 500 MG 24 hr tablet TAKE 2 TABLETS BY MOUTH AT BEDTIME FOR MOOD CONTROL 60 tablet 5  . Galcanezumab-gnlm (EMGALITY) 120 MG/ML SOAJ Inject 120 mg into the skin every 30 (thirty) days. 1 mL 11  . hydrOXYzine (VISTARIL) 50 MG capsule TK ONE C PO TID PRA  2  . lamoTRIgine (LAMICTAL) 150 MG tablet Take 300 mg by mouth at bedtime.  1  . levocetirizine (XYZAL) 5 MG tablet Take 5 mg by mouth every evening.  1  . lurasidone (LATUDA) 80 MG TABS tablet Take 1 tablet (80 mg total) by mouth daily with supper. For mood control 30 tablet 0  . meloxicam (MOBIC) 15 MG tablet TK  1 T PO QD    . metoprolol succinate (TOPROL-XL) 100 MG 24 hr tablet Take 1 tablet (100 mg total) by mouth daily. Take with or immediately following a meal. 30 tablet 0  . promethazine (PHENERGAN) 25 MG tablet TK 1 T PO BID FOR 15 DAYS PRN  0  . SUMAtriptan Succinate Refill 6 MG/0.5ML SOCT Inject 0.5 ml SQ at the onset of migraine. May repeat in 2 hours if needed. 0.5 mL 5  . traZODone (DESYREL) 50 MG tablet Take 1 tablet (50 mg total) by mouth at bedtime as needed for sleep. (Patient taking differently: Take 100 mg by mouth. ) 30 tablet 0   Current Facility-Administered Medications on File Prior to Visit  Medication Dose Route Frequency Provider Last Rate Last Dose  . methylPREDNISolone acetate (DEPO-MEDROL) injection 40 mg  40 mg Intra-articular Once Hudnall, Sharyn Lull, MD      . valproate (DEPACON) 1,000 mg in dextrose 5 % 50 mL IVPB  1,000 mg Intravenous Once Dohmeier, Asencion Partridge, MD        Past Surgical History:  Procedure Laterality Date  . ANKLE SURGERY  12/88   left   . chest nodule  1990?   rt chest wall nodule removal  . GANGLION CYST EXCISION  2011  .  lipoma removal    . right bunioectomy    . SHOULDER SURGERY  01/13/2011   right, partial tear  . tumor resection left thigh      Allergies  Allergen Reactions  . Adhesive [Tape] Itching and Rash    Also reacted to Steri Strips and Band-Aids.  . Dilaudid [Hydromorphone Hcl] Itching  . Morphine Nausea And Vomiting  . Penicillins Hives    Has patient had a PCN reaction causing immediate rash, facial/tongue/throat swelling, SOB or lightheadedness with hypotension: YES Has patient had a PCN reaction causing severe rash involving mucus membranes or skin necrosis: NO Has patient had a PCN reaction that required hospitalization NO Has patient had a PCN reaction occurring within the last 10 years:NO If all of the above answers are "NO", then may proceed with Cephalosporin use.  Marland Kitchen Percocet [Oxycodone-Acetaminophen] Itching  . Prednisone  Hives  . Provera [Medroxyprogesterone Acetate] Other (See Comments)    Causes manic episodes  . Ultram [Tramadol Hcl] Itching    Social History   Socioeconomic History  . Marital status: Single    Spouse name: Not on file  . Number of children: 0  . Years of education: Not on file  . Highest education level: Not on file  Occupational History    Employer: [employer]  . Financial resource strain: Not on file  . Food insecurity    Worry: Not on file    Inability: Not on file  . Transportation needs    Medical: Not on file    Non-medical: Not on file  Tobacco Use  . Smoking status: Never Smoker  . Smokeless tobacco: Never Used  Substance and Sexual Activity  . Alcohol use: No    Alcohol/week: 0.0 standard drinks  . Drug use: No  . Sexual activity: Never    Birth control/protection: Pill  Lifestyle  . Physical activity    Days per week: Not on file    Minutes per session: Not on file  . Stress: Not on file  Relationships  . Social Herbalist on phone: Not on file    Gets together: Not on file    Attends religious service: Not on file    Active member of club or organization: Not on file    Attends meetings of clubs or organizations: Not on file    Relationship status: Not on file  . Intimate partner violence    Fear of current or ex partner: Not on file    Emotionally abused: Not on file    Physically abused: Not on file    Forced sexual activity: Not on file  Other Topics Concern  . Not on file  Social History Narrative   Caffeine  2 sodas daily, 1 cup coffee daily.    Family History  Problem Relation Age of Onset  . Hypertension Mother   . Hyperlipidemia Mother   . 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 Heart attack Father   . Heart disease Father   . Hypertension Father   . Bipolar disorder Father   . Diabetes Paternal Grandfather   . Heart disease Maternal Aunt   . Breast cancer Maternal  Aunt 65  . Heart disease Maternal Grandmother   . Colon cancer Maternal Grandmother 66  . Colon cancer Maternal Uncle 75  . Lymphoma Maternal Grandfather 64  . Breast cancer Maternal Aunt 65  . Breast cancer Maternal  Aunt 35  . Breast cancer Maternal Aunt   . Bone cancer Cousin 71  . Cervical cancer Cousin        Genetic testing- 'was positive for a gene' had a mastectomy    BP 112/80   Wt 254 lb (115.2 kg)   BMI 36.45 kg/m   Review of Systems: See HPI above.     Objective:  Physical Exam:  Gen: NAD, comfortable in exam room  Left shoulder: No swelling, ecchymoses.  No gross deformity. No TTP AC joint, biceps tendon. FROM passively.  Active abduction and flexion to 90 degrees.  Full IR and ER Negative Hawkins, Neers. Negative Yergasons. Strength 5/5 with empty can and resisted internal/external rotation. Pain with apprehension. NV intact distally.  Brief MSK u/s:  Subscapularis, infraspinatus, and supraspinatus appear intact though could not position in crass position.     Assessment & Plan:  1. Left shoulder injury - radiographs negative.  Brief MSK u/s today without obvious rotator cuff tear.  Concerned she still feels instability in this shoulder.  Will start physical therapy - will have to take it slower given this sensation but hopefully she will respond to physical therapy.  If not will have to go ahead with MRI arthrogram.  Icing, meloxicam, tylenol, topical medications in meantime.

## 2018-10-02 DIAGNOSIS — Z961 Presence of intraocular lens: Secondary | ICD-10-CM | POA: Diagnosis not present

## 2018-10-02 DIAGNOSIS — H43812 Vitreous degeneration, left eye: Secondary | ICD-10-CM | POA: Diagnosis not present

## 2018-10-02 DIAGNOSIS — H5310 Unspecified subjective visual disturbances: Secondary | ICD-10-CM | POA: Diagnosis not present

## 2018-10-02 DIAGNOSIS — H31002 Unspecified chorioretinal scars, left eye: Secondary | ICD-10-CM | POA: Diagnosis not present

## 2018-10-14 ENCOUNTER — Encounter: Payer: Self-pay | Admitting: Physical Therapy

## 2018-10-14 ENCOUNTER — Other Ambulatory Visit: Payer: Self-pay

## 2018-10-14 ENCOUNTER — Ambulatory Visit: Payer: PPO | Attending: Family Medicine | Admitting: Physical Therapy

## 2018-10-14 DIAGNOSIS — M25612 Stiffness of left shoulder, not elsewhere classified: Secondary | ICD-10-CM | POA: Diagnosis not present

## 2018-10-14 DIAGNOSIS — M542 Cervicalgia: Secondary | ICD-10-CM | POA: Diagnosis not present

## 2018-10-14 DIAGNOSIS — M25512 Pain in left shoulder: Secondary | ICD-10-CM

## 2018-10-14 DIAGNOSIS — R252 Cramp and spasm: Secondary | ICD-10-CM | POA: Diagnosis not present

## 2018-10-14 NOTE — Therapy (Signed)
[name], [place], [address] Phone: [phone]   Fax:  [phone]  Physical Therapy Evaluation  Patient Details  Name: [name] MRN: [id number] Date of Birth: 03-09-1970 Referring Provider (PT): [place], [place]   Encounter Date: 10/14/2018  PT End of Session - 10/14/18 1640    Visit Number  1    Date for PT Re-Evaluation  12/14/18    PT Start Time  4709    PT Stop Time  1700    PT Time Calculation (min)  51 min    Activity Tolerance  Patient tolerated treatment well    Behavior During Therapy  [facility] for tasks assessed/performed       Past Medical History:  Diagnosis Date  . Achilles tendinitis   . Achilles tendinitis   . ADHD (attention deficit hyperactivity disorder)   . Allergy   . Arthritis   . Bipolar affective ([place])   . Bipolar disorder ([place])   . Cataracts, bilateral   . Eczema   . Family history of breast cancer   . Family history of colon cancer   . Family history of genetic disease carrier   . Ganglion cyst 09/29/2009   left wrist (2 cyst)  . Hyperprolactinemia ([place])   . Hypertension   . Lipoma   . Migraine   . Personality disorder ([place])   . Pes planus     Past Surgical History:  Procedure Laterality Date  . ANKLE SURGERY  12/88   left   . chest nodule  1990?   rt chest wall nodule removal  . GANGLION CYST EXCISION  2011  . lipoma removal    . right bunioectomy    . SHOULDER SURGERY  01/13/2011   right, partial tear  . tumor resection left thigh      There were no vitals filed for this visit.   Subjective Assessment - 10/14/18 1613    Subjective  Patient reports that she slipped while cutting branches, she landed on her left shoulder and reports that it came out of joint and went back in, she reports that it has happened many times since.  This occurred mid to late August.  She reports x-rays negative.  She also has a [facility] for [facility] pain with spondylosis.    Limitations  Lifting;House hold activities    Patient Stated Goals  have a stable shoulder, less pain, stronger    Currently in Pain?  Yes    Pain Score  6     Pain Location  Shoulder   and neck   Pain Orientation  Left    Pain Descriptors / Indicators  Aching    Pain Type  Acute pain    Pain Radiating Towards  some feeling in the left hand    Pain Onset  1 to 4 weeks ago    Pain Frequency  Constant    Aggravating Factors   lifting, using arm over head and away form body, dressing, doing hair  pain up to 9/10, looking up causes neck pain    Pain Relieving Factors  keeping arm next to body, ice, some pain medication at best pain is a 5/10    Effect of Pain on Daily Activities  difficulty with all ADL's, has not returned to work due to [employer]         [facility] PT Assessment - 10/14/18 0001      Assessment  Medical Diagnosis  left shoulder pain, neck pain    Referring Provider (PT)  [place], Mahar-PAC    Onset Date/Surgical Date  09/23/18    Hand Dominance  Right      Precautions   Precautions  None      Balance Screen   Has the patient fallen in the past 6 months  Yes    How many times?  1    Has the patient had a decrease in activity level because of a fear of falling?   No    Is the patient reluctant to leave their home because of a fear of falling?   No      Home Environment   Additional Comments  does some yardwork, sews      Prior Function   Level of Independence  Independent    Vocation  Part time employment    Set designer    Leisure  no exercxise      Posture/Postural Control   Posture Comments  fwd head, rounded shoulders, she is gaurded on the left with left shoulder elevation, very gaurded      ROM / Strength   AROM / PROM / Strength  AROM;Strength;PROM      AROM   Overall AROM Comments  cervical ROM decreased 25% for all motions with some pain    AROM Assessment Site  Shoulder    Right/Left  Shoulder  Left    Left Shoulder Flexion  80 Degrees    Left Shoulder ABduction  65 Degrees    Left Shoulder Internal Rotation  32 Degrees    Left Shoulder External Rotation  40 Degrees      PROM   Overall PROM Comments  very apprehensive and gaurded    PROM Assessment Site  Shoulder    Right/Left Shoulder  Left    Left Shoulder Flexion  90 Degrees    Left Shoulder ABduction  90 Degrees    Left Shoulder Internal Rotation  40 Degrees    Left Shoulder External Rotation  45 Degrees      Strength   Overall Strength Comments  at side patient gave some resistance but the left shoulder really hurt      Palpation   Palpation comment  she is very tender to palpation around the left shoulder                Objective measurements completed on examination: See above findings.      [facility] Adult PT Treatment/Exercise - 10/14/18 0001      Modalities   Modalities  Electrical Stimulation;Moist Heat      Moist Heat Therapy   Number Minutes Moist Heat  15 Minutes    Moist Heat Location  Shoulder      Electrical Stimulation   Electrical Stimulation Location  left shoulder    Electrical Stimulation Action  IFC    Electrical Stimulation Parameters  sitting    Electrical Stimulation Goals  Pain             PT Education - 10/14/18 1640    Education Details  isometric shoulder exercises    Person(s) Educated  Patient    Methods  Explanation;Demonstration;Handout;Tactile cues;Verbal cues    Comprehension  Verbalized understanding          PT Long Term Goals - 10/14/18 1653      PT LONG TERM GOAL #1   Title  decrease pain in the left shoulder 50%    Time  8    Period  Weeks    Status  New      PT LONG TERM GOAL #2   Title  report able to dress and do hair without difficulty    Time  8    Period  Weeks    Status  New      PT LONG TERM GOAL #3   Title  increase left shoulder AROM for flexion to 140 degrees    Time  8    Period  Weeks    Status  New      PT  LONG TERM GOAL #4   Title  increase left shoulder IR to 65 degrees    Time  8    Period  Weeks    Status  New      PT LONG TERM GOAL #5   Title  return to mowing the yard    Time  [place] - 10/14/18 1641    Clinical Impression Statement  Patient reports a fall mid to late August, she reports sustaining a left shoulder subluxation, was in sling for 2 weeks and then out.  She comes in in a gaurded position, has a lot of spasms in the upper trap and the rhomboids.  She has very limited ROM.  She also has an order from [facility] for neck with spondylosis    Stability/Clinical Decision Making  Evolving/Moderate complexity    Clinical Decision Making  Moderate    Rehab Potential  Good    PT Frequency  2x / week    PT Duration  8 weeks    PT Treatment/Interventions  ADLs/Self Care Home Management;Cryotherapy;Electrical Stimulation;Moist Heat;Ultrasound;Therapeutic activities;Therapeutic exercise;Manual techniques;Patient/family education    PT Next Visit Plan  Slowly start isometrics    Consulted and Agree with Plan of Care  Patient       Patient will benefit from skilled therapeutic intervention in order to improve the following deficits and impairments:  Pain, Improper body mechanics, Increased muscle spasms, Postural dysfunction, Impaired UE functional use, Decreased strength, Decreased range of motion  Visit Diagnosis: Acute pain of left shoulder - Plan: PT plan of care cert/re-cert  Stiffness of left shoulder, not elsewhere classified - Plan: PT plan of care cert/re-cert  Cramp and spasm - Plan: PT plan of care cert/re-cert  Cervicalgia - Plan: PT plan of care cert/re-cert     Problem List Patient Active Problem List   Diagnosis Date Noted  . 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 Genetic testing 02/27/2018  . Family history of genetic disease carrier   . Family history of breast cancer   .  Family history of colon cancer   . Head injury with loss of consciousness ([place]) 11/20/2017  . Nausea 11/20/2017  . 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 Tremor observed on examination 11/20/2017  . Left shoulder pain 02/18/2017  . MDD (major depressive disorder), recurrent severe, without psychosis ([place]) 12/28/2016  . Muscle strain 09/19/2016  . Right foot injury, subsequent encounter 08/25/2016  . Low back pain 06/27/2016  . Left wrist injury, subsequent encounter 06/27/2016  . Pain of left thumb 10/07/2015  . 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 Strain of left thumb 02/11/2015  . Strain of right forearm 02/11/2015  .  Right shoulder pain 12/31/2014  . Injury of right little finger 12/31/2014  . Episodic cluster headache, 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  . 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 Hypersomnia, recurrent 12/02/2014  . Migraine aura, persistent, intractable, with status migrainosus 12/02/2014  . Lower back injury 09/30/2014  . Bipolar I disorder, most recent episode depressed ([place])   . MDD (major depressive disorder), recurrent, severe, with psychosis ([place]) 09/23/2014  . Injury of left index finger 08/20/2014  . Right ankle sprain 06/01/2014  . Contusion, multiple sites 06/01/2014  . Strain of right gastrocnemius muscle 06/01/2014  . Phonophobia 05/04/2014  . Photophobia of both eyes 05/04/2014  . Emotionally unstable borderline personality disorder ([place]) 05/04/2014  . Nausea with vomiting 05/04/2014  . Mixed bipolar I disorder ([place])   . Bipolar I disorder, most recent episode mixed ([place]) 04/18/2014  . Bipolar affective disorder, depressed, mild ([place]) 04/12/2014  . Suicidal ideation 04/12/2014  . Injury of right shoulder and upper arm 02/17/2014  . Left leg pain 06/03/2013  . Right hip pain 06/03/2013  .  Migraine with status migrainosus 01/08/2013  . Personality disorder ([place])   . Chronic migraine 05/08/2012  . Contact dermatitis 11/27/2011  . Major depressive disorder, recurrent episode ([place]) 10/27/2011  . Generalized anxiety disorder 10/27/2011  . 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 Borderline personality disorder ([place]) 10/27/2011  . Right foot pain 09/28/2011  . 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 Ganglion cyst 09/29/2009  . PES PLANUS 07/01/2008  . BIPOLAR DISORDER UNSPECIFIED 06/09/2008    Sumner Boast., PT 10/14/2018, 5:55 PM  [place] [address] [place], [place], [address] Phone: [phone]   Fax:  [phone]  Name: CHANLER SCHREITER MRN: [id number] Date of Birth: Jan 19, 1971

## 2018-10-21 ENCOUNTER — Encounter: Payer: Self-pay | Admitting: Physical Therapy

## 2018-10-21 ENCOUNTER — Other Ambulatory Visit: Payer: Self-pay

## 2018-10-21 ENCOUNTER — Ambulatory Visit: Payer: PPO | Admitting: Physical Therapy

## 2018-10-21 DIAGNOSIS — M25612 Stiffness of left shoulder, not elsewhere classified: Secondary | ICD-10-CM

## 2018-10-21 DIAGNOSIS — M542 Cervicalgia: Secondary | ICD-10-CM

## 2018-10-21 DIAGNOSIS — M25512 Pain in left shoulder: Secondary | ICD-10-CM | POA: Diagnosis not present

## 2018-10-21 DIAGNOSIS — R252 Cramp and spasm: Secondary | ICD-10-CM

## 2018-10-21 NOTE — Therapy (Signed)
[name] [name] [place], [place], [address] Phone: [phone]   Fax:  [phone]  Physical Therapy Treatment  Patient Details  Name: [name] MRN: [id number] Date of Birth: November 28, 1970 Referring Provider (PT): [place], [place]   Encounter Date: 10/21/2018  PT End of Session - 10/21/18 1646    Visit Number  2    Date for PT Re-Evaluation  12/14/18    PT Start Time  8841    PT Stop Time  1704    PT Time Calculation (min)  49 min    Activity Tolerance  Patient tolerated treatment well    Behavior During Therapy  [facility] for tasks assessed/performed       Past Medical History:  Diagnosis Date  . Achilles tendinitis   . Achilles tendinitis   . ADHD (attention deficit hyperactivity disorder)   . Allergy   . Arthritis   . Bipolar affective ([place])   . Bipolar disorder ([place])   . Cataracts, bilateral   . Eczema   . Family history of breast cancer   . Family history of colon cancer   . Family history of genetic disease carrier   . Ganglion cyst 09/29/2009   left wrist (2 cyst)  . Hyperprolactinemia ([place])   . Hypertension   . Lipoma   . Migraine   . Personality disorder ([place])   . Pes planus     Past Surgical History:  Procedure Laterality Date  . ANKLE SURGERY  12/88   left   . chest nodule  1990?   rt chest wall nodule removal  . GANGLION CYST EXCISION  2011  . lipoma removal    . right bunioectomy    . SHOULDER SURGERY  01/13/2011   right, partial tear  . tumor resection left thigh      There were no vitals filed for this visit.  Subjective Assessment - 10/21/18 1618    Subjective  Patient reports that if she is not moving she is okay, but really cannot reach up or out    Currently in Pain?  Yes    Pain Score  4     Pain Location  Shoulder    Pain Orientation  Left                       [facility] Adult PT Treatment/Exercise - 10/21/18 0001      Exercises    Exercises  Shoulder      Shoulder Exercises: Supine   Other Supine Exercises  assisted punches , small isometric circles      Shoulder Exercises: Standing   External Rotation  Both;20 reps;Theraband    Theraband Level (Shoulder External Rotation)  Level 1 (Yellow)    Extension  20 reps;Theraband    Row  20 reps;Theraband    Theraband Level (Shoulder Row)  Level 2 (Red)    Other Standing Exercises  cervical retraction, wall slides to head high      Shoulder Exercises: ROM/Strengthening   UBE (Upper Arm Bike)  level 1 x 4 minutes      Shoulder Exercises: Isometric Strengthening   Flexion  3X5"    Extension  3X5"    External Rotation  3X5"    Internal Rotation  3X5"    ABduction  3X5"      Modalities   Modalities  Electrical Stimulation;Moist Heat      Moist Heat Therapy   Number Minutes Moist  Heat  15 Minutes    Moist Heat Location  Shoulder      Electrical Stimulation   Electrical Stimulation Location  left shoulder    Electrical Stimulation Action  IFC    Electrical Stimulation Parameters  supine    Electrical Stimulation Goals  Pain                  PT Long Term Goals - 10/14/18 1653      PT LONG TERM GOAL #1   Title  decrease pain in the left shoulder 50%    Time  8    Period  Weeks    Status  New      PT LONG TERM GOAL #2   Title  report able to dress and do hair without difficulty    Time  8    Period  Weeks    Status  New      PT LONG TERM GOAL #3   Title  increase left shoulder AROM for flexion to 140 degrees    Time  8    Period  Weeks    Status  New      PT LONG TERM GOAL #4   Title  increase left shoulder IR to 65 degrees    Time  8    Period  Weeks    Status  New      PT LONG TERM GOAL #5   Title  return to mowing the yard    Time  8    Period  Weeks    Status  New            Plan - 10/21/18 1647    Clinical Impression Statement  Patient able to perform exercises but did have some fear, she did need some help and some  changes due to pain and weakness    PT Next Visit Plan  see how she did with the exercises and progress as tolerated    Consulted and Agree with Plan of Care  Patient       Patient will benefit from skilled therapeutic intervention in order to improve the following deficits and impairments:  Pain, Improper body mechanics, Increased muscle spasms, Postural dysfunction, Impaired UE functional use, Decreased strength, Decreased range of motion  Visit Diagnosis: Acute pain of left shoulder  Stiffness of left shoulder, not elsewhere classified  Cramp and spasm  Cervicalgia     Problem List Patient Active Problem List   Diagnosis Date Noted  . 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 Genetic testing 02/27/2018  . Family history of genetic disease carrier   . Family history of breast cancer   . Family history of colon cancer   . Head injury with loss of consciousness ([place]) 11/20/2017  . Nausea 11/20/2017  . 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 Tremor observed on examination 11/20/2017  . Left shoulder pain 02/18/2017  . MDD (major depressive disorder), recurrent severe, without psychosis ([place]) 12/28/2016  . Muscle strain 09/19/2016  . Right foot injury, subsequent encounter 08/25/2016  . Low back pain 06/27/2016  . Left wrist injury, subsequent encounter 06/27/2016  . Pain of left thumb 10/07/2015  . 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 Strain of left thumb 02/11/2015  . Strain of right forearm 02/11/2015  . Right shoulder pain 12/31/2014  . Injury of right little finger 12/31/2014  . Episodic cluster  headache, 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  . 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 Hypersomnia, recurrent 12/02/2014  . Migraine aura, persistent, intractable, with status migrainosus 12/02/2014   . Lower back injury 09/30/2014  . Bipolar I disorder, most recent episode depressed ([place])   . MDD (major depressive disorder), recurrent, severe, with psychosis ([place]) 09/23/2014  . Injury of left index finger 08/20/2014  . Right ankle sprain 06/01/2014  . Contusion, multiple sites 06/01/2014  . Strain of right gastrocnemius muscle 06/01/2014  . Phonophobia 05/04/2014  . Photophobia of both eyes 05/04/2014  . Emotionally unstable borderline personality disorder ([place]) 05/04/2014  . Nausea with vomiting 05/04/2014  . Mixed bipolar I disorder ([place])   . Bipolar I disorder, most recent episode mixed ([place]) 04/18/2014  . Bipolar affective disorder, depressed, mild ([place]) 04/12/2014  . Suicidal ideation 04/12/2014  . Injury of right shoulder and upper arm 02/17/2014  . Left leg pain 06/03/2013  . Right hip pain 06/03/2013  . Migraine with status migrainosus 01/08/2013  . Personality disorder ([place])   . Chronic migraine 05/08/2012  . Contact dermatitis 11/27/2011  . Major depressive disorder, recurrent episode ([place]) 10/27/2011  . Generalized anxiety disorder 10/27/2011  . 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 Borderline personality disorder ([place]) 10/27/2011  . Right foot pain 09/28/2011  . 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) 09/02/2010  . Ganglion cyst 09/29/2009  . PES PLANUS 07/01/2008  . BIPOLAR DISORDER UNSPECIFIED 06/09/2008    Sumner Boast., PT 10/21/2018, 4:48 PM  [place] [address] [place], [place], [address] Phone: [phone]   Fax:  [phone]  Name: [name] MRN: [id number] Date of Birth: August 06, 1970

## 2018-10-23 ENCOUNTER — Ambulatory Visit: Payer: PPO | Admitting: Physical Therapy

## 2018-10-23 ENCOUNTER — Other Ambulatory Visit: Payer: Self-pay

## 2018-10-23 ENCOUNTER — Ambulatory Visit: Payer: PPO | Admitting: Family Medicine

## 2018-10-23 ENCOUNTER — Encounter: Payer: Self-pay | Admitting: Physical Therapy

## 2018-10-23 DIAGNOSIS — M25512 Pain in left shoulder: Secondary | ICD-10-CM | POA: Diagnosis not present

## 2018-10-23 DIAGNOSIS — M25612 Stiffness of left shoulder, not elsewhere classified: Secondary | ICD-10-CM

## 2018-10-23 DIAGNOSIS — R252 Cramp and spasm: Secondary | ICD-10-CM

## 2018-10-23 NOTE — Therapy (Signed)
[place], [place], [address] Phone: [phone]   Fax:  [phone]  Physical Therapy Treatment  Patient Details  Name: [name] MRN: [id number] Date of Birth: [birth year] Referring Provider (PT): [place], [place]   Encounter Date: 10/23/2018  PT End of Session - 10/23/18 1602    Visit Number  3    Date for PT Re-Evaluation  12/14/18    PT Start Time  1528    PT Stop Time  1622    PT Time Calculation (min)  54 min    Activity Tolerance  Patient tolerated treatment well    Behavior During Therapy  [facility] for tasks assessed/performed       Past Medical History:  Diagnosis Date  . Achilles tendinitis   . Achilles tendinitis   . ADHD (attention deficit hyperactivity disorder)   . Allergy   . Arthritis   . Bipolar affective ([place])   . Bipolar disorder ([place])   . Cataracts, bilateral   . Eczema   . Family history of breast cancer   . Family history of colon cancer   . Family history of genetic disease carrier   . Ganglion cyst 09/29/2009   left wrist (2 cyst)  . Hyperprolactinemia ([place])   . Hypertension   . Lipoma   . Migraine   . Personality disorder ([place])   . Pes planus     Past Surgical History:  Procedure Laterality Date  . ANKLE SURGERY  12/88   left   . chest nodule  1990?   rt chest wall nodule removal  . GANGLION CYST EXCISION  2011  . lipoma removal    . right bunioectomy    . SHOULDER SURGERY  01/13/2011   right, partial tear  . tumor resection left thigh      There were no vitals filed for this visit.  Subjective Assessment - 10/23/18 1531    Subjective  Patient reports that she was very sore after the last treatment    Currently in Pain?  Yes    Pain Score  5     Pain Location  Shoulder    Pain Orientation  Left    Aggravating Factors   the exercises made me sore                       [facility] Adult PT Treatment/Exercise - 10/23/18 0001       Shoulder Exercises: Supine   Other Supine Exercises  assisted punches , small isometric circles, 2#      Shoulder Exercises: Seated   Extension  Left;20 reps    Extension Weight (lbs)  1    Row  Left;10 reps    Row Weight (lbs)  3      Shoulder Exercises: Sidelying   External Rotation  Left;20 reps;Weights    External Rotation Weight (lbs)  1      Shoulder Exercises: Standing   External Rotation  Both;20 reps;Theraband    Theraband Level (Shoulder External Rotation)  Level 1 (Yellow)    Extension  20 reps;Theraband    Theraband Level (Shoulder Extension)  Level 2 (Red)    Row  20 reps;Theraband    Theraband Level (Shoulder Row)  Level 2 (Red)      Shoulder Exercises: ROM/Strengthening   UBE (Upper Arm Bike)  level 1 x 4 minutes      Shoulder Exercises: Isometric Strengthening   Flexion  3X5"    Extension  3X5"    External Rotation  3X5"    Internal Rotation  3X5"      Modalities   Modalities  Electrical Stimulation;Moist Heat      Moist Heat Therapy   Number Minutes Moist Heat  15 Minutes    Moist Heat Location  Shoulder      Electrical Stimulation   Electrical Stimulation Location  left shoulder    Electrical Stimulation Action  IFC    Electrical Stimulation Parameters  supine    Electrical Stimulation Goals  Pain                  PT Long Term Goals - 10/23/18 1605      PT LONG TERM GOAL #1   Title  decrease pain in the left shoulder 50%    Status  On-going      PT LONG TERM GOAL #2   Title  report able to dress and do hair without difficulty    Status  On-going            Plan - 10/23/18 1603    Clinical Impression Statement  Patient needs a lot of cues to do eth exercises as she is a little lax with the exercises and when she stops she fully relaxes and then she feels anxious about the shoulder "coming out"    PT Next Visit Plan  slowly try to work on shoulder stability    Consulted and Agree with Plan of Care  Patient        Patient will benefit from skilled therapeutic intervention in order to improve the following deficits and impairments:  Pain, Improper body mechanics, Increased muscle spasms, Postural dysfunction, Impaired UE functional use, Decreased strength, Decreased range of motion  Visit Diagnosis: Acute pain of left shoulder  Stiffness of left shoulder, not elsewhere classified  Cramp and spasm     Problem List Patient Active Problem List   Diagnosis Date Noted  . 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 Genetic testing 02/27/2018  . Family history of genetic disease carrier   . Family history of breast cancer   . Family history of colon cancer   . Head injury with loss of consciousness ([place]) 11/20/2017  . Nausea 11/20/2017  . 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 Tremor observed on examination 11/20/2017  . Left shoulder pain 02/18/2017  . MDD (major depressive disorder), recurrent severe, without psychosis ([place]) 12/28/2016  . Muscle strain 09/19/2016  . Right foot injury, subsequent encounter 08/25/2016  . Low back pain 06/27/2016  . Left wrist injury, subsequent encounter 06/27/2016  . Pain of left thumb 10/07/2015  . 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 Strain of left thumb 02/11/2015  . Strain of right forearm 02/11/2015  . Right shoulder pain 12/31/2014  . Injury of right little finger 12/31/2014  . Episodic cluster headache, 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  . 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 Hypersomnia, recurrent 12/02/2014  . Migraine aura, persistent, intractable, with status migrainosus 12/02/2014  . Lower back injury 09/30/2014  . Bipolar I disorder, most recent episode depressed ([place])   . MDD (major depressive disorder), recurrent, severe, with psychosis ([place]) 09/23/2014  .  Injury of left index finger 08/20/2014  . Right ankle sprain 06/01/2014  . Contusion, multiple sites 06/01/2014  . Strain of  right gastrocnemius muscle 06/01/2014  . Phonophobia 05/04/2014  . Photophobia of both eyes 05/04/2014  . Emotionally unstable borderline personality disorder ([place]) 05/04/2014  . Nausea with vomiting 05/04/2014  . Mixed bipolar I disorder ([place])   . Bipolar I disorder, most recent episode mixed ([place]) 04/18/2014  . Bipolar affective disorder, depressed, mild ([place]) 04/12/2014  . Suicidal ideation 04/12/2014  . Injury of right shoulder and upper arm 02/17/2014  . Left leg pain 06/03/2013  . Right hip pain 06/03/2013  . Migraine with status migrainosus 01/08/2013  . Personality disorder ([place])   . Chronic migraine 05/08/2012  . Contact dermatitis 11/27/2011  . Major depressive disorder, recurrent episode ([place]) 10/27/2011  . Generalized anxiety disorder 10/27/2011  . 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 Borderline personality disorder ([place]) 10/27/2011  . Right foot pain 09/28/2011  . 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 Ganglion cyst 09/29/2009  . PES PLANUS 07/01/2008  . BIPOLAR DISORDER UNSPECIFIED 06/09/2008    Sumner Boast., PT 10/23/2018, 4:05 PM  [place] [address] [place], [place], [address] Phone: [phone]   Fax:  [phone]  Name: [name] MRN: [id number] Date of Birth: [birth year]

## 2018-10-24 DIAGNOSIS — F329 Major depressive disorder, single episode, unspecified: Secondary | ICD-10-CM | POA: Diagnosis not present

## 2018-10-24 DIAGNOSIS — I10 Essential (primary) hypertension: Secondary | ICD-10-CM | POA: Diagnosis not present

## 2018-10-28 ENCOUNTER — Ambulatory Visit: Payer: PPO | Admitting: Physical Therapy

## 2018-10-28 ENCOUNTER — Encounter: Payer: Self-pay | Admitting: Physical Therapy

## 2018-10-28 ENCOUNTER — Other Ambulatory Visit: Payer: Self-pay

## 2018-10-28 DIAGNOSIS — M25512 Pain in left shoulder: Secondary | ICD-10-CM

## 2018-10-28 DIAGNOSIS — M542 Cervicalgia: Secondary | ICD-10-CM

## 2018-10-28 DIAGNOSIS — M25612 Stiffness of left shoulder, not elsewhere classified: Secondary | ICD-10-CM

## 2018-10-28 DIAGNOSIS — R252 Cramp and spasm: Secondary | ICD-10-CM

## 2018-10-28 NOTE — Therapy (Signed)
[place], [place], [address] Phone: [phone]   Fax:  [phone]  Physical Therapy Treatment  Patient Details  Name: [name] MRN: [id number] Date of Birth: February 15, 1970 Referring Provider (PT): [place], [place]   Encounter Date: 10/28/2018  PT End of Session - 10/28/18 1657    Visit Number  4    Date for PT Re-Evaluation  12/14/18    PT Start Time  8115    PT Stop Time  1714    PT Time Calculation (min)  58 min    Activity Tolerance  Patient tolerated treatment well    Behavior During Therapy  [facility] for tasks assessed/performed       Past Medical History:  Diagnosis Date  . Achilles tendinitis   . Achilles tendinitis   . ADHD (attention deficit hyperactivity disorder)   . Allergy   . Arthritis   . Bipolar affective ([place])   . Bipolar disorder ([place])   . Cataracts, bilateral   . Eczema   . Family history of breast cancer   . Family history of colon cancer   . Family history of genetic disease carrier   . Ganglion cyst 09/29/2009   left wrist (2 cyst)  . Hyperprolactinemia ([place])   . Hypertension   . Lipoma   . Migraine   . Personality disorder ([place])   . Pes planus     Past Surgical History:  Procedure Laterality Date  . ANKLE SURGERY  12/88   left   . chest nodule  1990?   rt chest wall nodule removal  . GANGLION CYST EXCISION  2011  . lipoma removal    . right bunioectomy    . SHOULDER SURGERY  01/13/2011   right, partial tear  . tumor resection left thigh      There were no vitals filed for this visit.  Subjective Assessment - 10/28/18 1620    Subjective  Patient reports that she was just adjusting the sun visor in her car and felt increaesd pain and unstable feeling    Currently in Pain?  Yes    Pain Score  5     Pain Location  Shoulder    Pain Orientation  Left    Aggravating Factors   reaching up                       [facility] Adult PT  Treatment/Exercise - 10/28/18 0001      Shoulder Exercises: Supine   Other Supine Exercises  assisted punches , small isometric circles, 2#    Other Supine Exercises  3# serratus punch      Shoulder Exercises: Seated   Extension  Left;20 reps    Extension Weight (lbs)  1    Row  Left;10 reps    Row Weight (lbs)  3    Protraction  Left;20 reps    Protraction Weight (lbs)  1      Shoulder Exercises: Standing   External Rotation  Both;20 reps;Theraband    Theraband Level (Shoulder External Rotation)  Level 1 (Yellow)    Flexion  AAROM;20 reps    Extension  20 reps;Theraband    Theraband Level (Shoulder Extension)  Level 2 (Red)    Row  20 reps;Theraband    Theraband Level (Shoulder Row)  Level 2 (Red)    Other Standing Exercises  a little approximation with ball on wall isometric circles  Shoulder Exercises: ROM/Strengthening   UBE (Upper Arm Bike)  level 3 x 6 minutes    Lat Pull  1.5 plate;20 reps    Cybex Row  1 plate;20 reps    Cybex Row Limitations  limited the ROM as higher caused pain      Shoulder Exercises: Body Blade   Flexion  30 seconds;2 reps    External Rotation  30 seconds;2 reps    Internal Rotation  30 seconds;2 reps      Electrical Stimulation   Electrical Stimulation Location  left shoulder    Electrical Stimulation Action  IFC    Electrical Stimulation Parameters  supine    Electrical Stimulation Goals  Pain                  PT Long Term Goals - 10/23/18 1605      PT LONG TERM GOAL #1   Title  decrease pain in the left shoulder 50%    Status  On-going      PT LONG TERM GOAL #2   Title  report able to dress and do hair without difficulty    Status  On-going            Plan - 10/28/18 1658    Clinical Impression Statement  Pateint reports that she increased her pain and feeling of instability by adjusting the visor in her car. She is continuing to have the feeling of instability especially with reaching over shoulder height     PT Next Visit Plan  slowly try to work on shoulder stability    Consulted and Agree with Plan of Care  Patient       Patient will benefit from skilled therapeutic intervention in order to improve the following deficits and impairments:  Pain, Improper body mechanics, Increased muscle spasms, Postural dysfunction, Impaired UE functional use, Decreased strength, Decreased range of motion  Visit Diagnosis: Acute pain of left shoulder  Stiffness of left shoulder, not elsewhere classified  Cervicalgia  Cramp and spasm     Problem List Patient Active Problem List   Diagnosis Date Noted  . 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 Genetic testing 02/27/2018  . Family history of genetic disease carrier   . Family history of breast cancer   . Family history of colon cancer   . Head injury with loss of consciousness ([place]) 11/20/2017  . Nausea 11/20/2017  . 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 Tremor observed on examination 11/20/2017  . Left shoulder pain 02/18/2017  . MDD (major depressive disorder), recurrent severe, without psychosis ([place]) 12/28/2016  . Muscle strain 09/19/2016  . Right foot injury, subsequent encounter 08/25/2016  . Low back pain 06/27/2016  . Left wrist injury, subsequent encounter 06/27/2016  . Pain of left thumb 10/07/2015  . 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 Strain of left thumb 02/11/2015  . Strain of right forearm 02/11/2015  . Right shoulder pain 12/31/2014  . Injury of right little finger 12/31/2014  . Episodic cluster headache, 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  . 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 Hypersomnia, recurrent 12/02/2014  . Migraine aura, persistent, intractable, with status migrainosus 12/02/2014  . Lower back injury 09/30/2014  . Bipolar I  disorder, most recent episode depressed ([place])   . MDD (major depressive disorder), recurrent, severe, with psychosis ([place]) 09/23/2014  . Injury  of left index finger 08/20/2014  . Right ankle sprain 06/01/2014  . Contusion, multiple sites 06/01/2014  . Strain of right gastrocnemius muscle 06/01/2014  . Phonophobia 05/04/2014  . Photophobia of both eyes 05/04/2014  . Emotionally unstable borderline personality disorder ([place]) 05/04/2014  . Nausea with vomiting 05/04/2014  . Mixed bipolar I disorder ([place])   . Bipolar I disorder, most recent episode mixed ([place]) 04/18/2014  . Bipolar affective disorder, depressed, mild ([place]) 04/12/2014  . Suicidal ideation 04/12/2014  . Injury of right shoulder and upper arm 02/17/2014  . Left leg pain 06/03/2013  . Right hip pain 06/03/2013  . Migraine with status migrainosus 01/08/2013  . Personality disorder ([place])   . Chronic migraine 05/08/2012  . Contact dermatitis 11/27/2011  . Major depressive disorder, recurrent episode ([place]) 10/27/2011  . Generalized anxiety disorder 10/27/2011  . 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 Borderline personality disorder ([place]) 10/27/2011  . Right foot pain 09/28/2011  . 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 Ganglion cyst 09/29/2009  . PES PLANUS 07/01/2008  . BIPOLAR DISORDER UNSPECIFIED 06/09/2008    Sumner Boast., PT 10/28/2018, 5:00 PM  [place] [address] [place], [place], [address] Phone: [phone]   Fax:  [phone]  Name: CARLA RASHAD MRN: [id number] Date of Birth: 1970-10-07

## 2018-10-30 ENCOUNTER — Ambulatory Visit: Payer: PPO | Admitting: Physical Therapy

## 2018-10-31 ENCOUNTER — Other Ambulatory Visit: Payer: Self-pay

## 2018-10-31 ENCOUNTER — Ambulatory Visit: Payer: PPO | Attending: Family Medicine | Admitting: Physical Therapy

## 2018-10-31 ENCOUNTER — Encounter: Payer: Self-pay | Admitting: Physical Therapy

## 2018-10-31 DIAGNOSIS — M25612 Stiffness of left shoulder, not elsewhere classified: Secondary | ICD-10-CM | POA: Insufficient documentation

## 2018-10-31 DIAGNOSIS — R252 Cramp and spasm: Secondary | ICD-10-CM | POA: Diagnosis not present

## 2018-10-31 DIAGNOSIS — M25512 Pain in left shoulder: Secondary | ICD-10-CM | POA: Insufficient documentation

## 2018-10-31 DIAGNOSIS — M542 Cervicalgia: Secondary | ICD-10-CM | POA: Insufficient documentation

## 2018-10-31 NOTE — Therapy (Signed)
[place], [place], [address] Phone: [phone]   Fax:  [phone]  Physical Therapy Treatment  Patient Details  Name: [name] MRN: [id number] Date of Birth: [birth year] Referring Provider (PT): [place], [place]   Encounter Date: 10/31/2018  PT End of Session - 10/31/18 1140    Visit Number  5    Date for PT Re-Evaluation  12/14/18    PT Start Time  1055    PT Stop Time  1144    PT Time Calculation (min)  49 min    Activity Tolerance  Patient tolerated treatment well    Behavior During Therapy  [facility] for tasks assessed/performed       Past Medical History:  Diagnosis Date  . Achilles tendinitis   . Achilles tendinitis   . ADHD (attention deficit hyperactivity disorder)   . Allergy   . Arthritis   . Bipolar affective ([place])   . Bipolar disorder ([place])   . Cataracts, bilateral   . Eczema   . Family history of breast cancer   . Family history of colon cancer   . Family history of genetic disease carrier   . Ganglion cyst 09/29/2009   left wrist (2 cyst)  . Hyperprolactinemia ([place])   . Hypertension   . Lipoma   . Migraine   . Personality disorder ([place])   . Pes planus     Past Surgical History:  Procedure Laterality Date  . ANKLE SURGERY  12/88   left   . chest nodule  1990?   rt chest wall nodule removal  . GANGLION CYST EXCISION  2011  . lipoma removal    . right bunioectomy    . SHOULDER SURGERY  01/13/2011   right, partial tear  . tumor resection left thigh      There were no vitals filed for this visit.  Subjective Assessment - 10/31/18 1106    Subjective  Stil hurting in the shoulder    Currently in Pain?  Yes    Pain Score  5     Pain Location  Shoulder    Pain Orientation  Left    Pain Descriptors / Indicators  Sore;Aching                       [facility] Adult PT Treatment/Exercise - 10/31/18 0001      Shoulder Exercises: Supine   Other Supine Exercises  assisted punches , small isometric circles, 2#      Shoulder Exercises: Seated   Extension  Left;20 reps    Extension Weight (lbs)  1    Row  Left;20 reps;Weights    Row Weight (lbs)  3    Protraction  Left;20 reps    Protraction Weight (lbs)  3      Shoulder Exercises: Sidelying   External Rotation  Left;20 reps;Weights    External Rotation Weight (lbs)  1      Shoulder Exercises: Standing   External Rotation  Both;20 reps;Theraband    Theraband Level (Shoulder External Rotation)  Level 2 (Red)    Flexion  AAROM;20 reps      Shoulder Exercises: ROM/Strengthening   UBE (Upper Arm Bike)  level 3 x 6 minutes    Lat Pull  1.5 plate;20 reps    Cybex Row  1.5 plate;20 reps    Cybex Row Limitations  limited the ROM as higher caused pain    Other ROM/Strengthening  Exercises  standing 5# straight arm extension      Shoulder Exercises: Body Blade   Flexion  30 seconds;2 reps      Electrical Stimulation   Electrical Stimulation Location  left shoulder    Electrical Stimulation Action  IFC    Electrical Stimulation Parameters  supine    Electrical Stimulation Goals  Pain                  PT Long Term Goals - 10/31/18 1142      PT LONG TERM GOAL #1   Title  decrease pain in the left shoulder 50%    Status  On-going      PT LONG TERM GOAL #3   Title  increase left shoulder AROM for flexion to 140 degrees    Status  On-going            Plan - 10/31/18 1141    Clinical Impression Statement  Patient still with some pain in the left shoulder area, worse with reacing above shoulder height.  She is able to activate the mms with verbal and tactile cues for form and posture.  The shoulder seems unstable above shoulder height    PT Next Visit Plan  slowly try to work on shoulder stability    Consulted and Agree with Plan of Care  Patient       Patient will benefit from skilled therapeutic intervention in order to improve the following deficits  and impairments:  Pain, Improper body mechanics, Increased muscle spasms, Postural dysfunction, Impaired UE functional use, Decreased strength, Decreased range of motion  Visit Diagnosis: Acute pain of left shoulder  Stiffness of left shoulder, not elsewhere classified  Cervicalgia  Cramp and spasm     Problem List Patient Active Problem List   Diagnosis Date Noted  . 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 Genetic testing 02/27/2018  . Family history of genetic disease carrier   . Family history of breast cancer   . Family history of colon cancer   . Head injury with loss of consciousness ([place]) 11/20/2017  . Nausea 11/20/2017  . 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 Tremor observed on examination 11/20/2017  . Left shoulder pain 02/18/2017  . MDD (major depressive disorder), recurrent severe, without psychosis ([place]) 12/28/2016  . Muscle strain 09/19/2016  . Right foot injury, subsequent encounter 08/25/2016  . Low back pain 06/27/2016  . Left wrist injury, subsequent encounter 06/27/2016  . Pain of left thumb 10/07/2015  . 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 Strain of left thumb 02/11/2015  . Strain of right forearm 02/11/2015  . Right shoulder pain 12/31/2014  . Injury of right little finger 12/31/2014  . Episodic cluster headache, 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  . 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 Hypersomnia, recurrent 12/02/2014  . Migraine aura, persistent, intractable, with status migrainosus 12/02/2014  . Lower back injury 09/30/2014  . Bipolar I disorder, most recent episode depressed ([place])   . MDD (major depressive disorder), recurrent, severe, with psychosis ([place]) 09/23/2014  . Injury of left index finger 08/20/2014  . Right ankle sprain 06/01/2014  . Contusion, multiple  sites 06/01/2014  . Strain of right gastrocnemius muscle 06/01/2014  . Phonophobia 05/04/2014  . Photophobia of both eyes 05/04/2014  . Emotionally unstable borderline personality disorder ([place]) 05/04/2014  . Nausea with vomiting  05/04/2014  . Mixed bipolar I disorder ([place])   . Bipolar I disorder, most recent episode mixed ([place]) 04/18/2014  . Bipolar affective disorder, depressed, mild ([place]) 04/12/2014  . Suicidal ideation 04/12/2014  . Injury of right shoulder and upper arm 02/17/2014  . Left leg pain 06/03/2013  . Right hip pain 06/03/2013  . Migraine with status migrainosus 01/08/2013  . Personality disorder ([place])   . Chronic migraine 05/08/2012  . Contact dermatitis 11/27/2011  . Major depressive disorder, recurrent episode ([place]) 10/27/2011  . Generalized anxiety disorder 10/27/2011  . 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 Borderline personality disorder ([place]) 10/27/2011  . Right foot pain 09/28/2011  . 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 Ganglion cyst 09/29/2009  . PES PLANUS 07/01/2008  . BIPOLAR DISORDER UNSPECIFIED 06/09/2008    Sumner Boast., PT 10/31/2018, 12:56 PM  [place] [address] [place], [place], [address] Phone: [phone]   Fax:  [phone]  Name: [name] MRN: [id number] Date of Birth: 03-24-1970

## 2018-11-04 ENCOUNTER — Ambulatory Visit: Payer: PPO | Admitting: Physical Therapy

## 2018-11-04 ENCOUNTER — Other Ambulatory Visit: Payer: Self-pay

## 2018-11-04 ENCOUNTER — Encounter: Payer: Self-pay | Admitting: Physical Therapy

## 2018-11-04 DIAGNOSIS — M25512 Pain in left shoulder: Secondary | ICD-10-CM | POA: Diagnosis not present

## 2018-11-04 DIAGNOSIS — M25612 Stiffness of left shoulder, not elsewhere classified: Secondary | ICD-10-CM

## 2018-11-04 NOTE — Therapy (Signed)
[address] [place], [place], [address] Phone: [phone]   Fax:  [phone]  Physical Therapy Treatment  Patient Details  Name: [name] MRN: [id number] Date of Birth: [birth year] Referring Provider (PT): [place], [place]   Encounter Date: 11/04/2018  PT End of Session - 11/04/18 1053    Visit Number  6    Date for PT Re-Evaluation  12/14/18    PT Start Time  0930    PT Stop Time  1026    PT Time Calculation (min)  56 min    Activity Tolerance  Patient tolerated treatment well    Behavior During Therapy  [facility] for tasks assessed/performed       Past Medical History:  Diagnosis Date  . Achilles tendinitis   . Achilles tendinitis   . ADHD (attention deficit hyperactivity disorder)   . Allergy   . Arthritis   . Bipolar affective ([place])   . Bipolar disorder ([place])   . Cataracts, bilateral   . Eczema   . Family history of breast cancer   . Family history of colon cancer   . Family history of genetic disease carrier   . Ganglion cyst 09/29/2009   left wrist (2 cyst)  . Hyperprolactinemia ([place])   . Hypertension   . Lipoma   . Migraine   . Personality disorder ([place])   . Pes planus     Past Surgical History:  Procedure Laterality Date  . ANKLE SURGERY  12/88   left   . chest nodule  1990?   rt chest wall nodule removal  . GANGLION CYST EXCISION  2011  . lipoma removal    . right bunioectomy    . SHOULDER SURGERY  01/13/2011   right, partial tear  . tumor resection left thigh      There were no vitals filed for this visit.  Subjective Assessment - 11/04/18 0938    Subjective  I am hurting some, but I may try to go back to work in November    Currently in Pain?  Yes    Pain Score  5     Pain Location  Shoulder    Pain Orientation  Left    Aggravating Factors   reaching         [facility] PT Assessment - 11/04/18 0001      AROM   Left Shoulder Flexion  100 Degrees    Left  Shoulder ABduction  81 Degrees                   [facility] Adult PT Treatment/Exercise - 11/04/18 0001      Shoulder Exercises: Supine   Other Supine Exercises  assisted punches , small isometric circles, 2#      Shoulder Exercises: Seated   Extension  Left;20 reps    Extension Weight (lbs)  1    Row  Left;20 reps;Weights    Row Weight (lbs)  3    Protraction  Left;20 reps    Protraction Weight (lbs)  3      Shoulder Exercises: Sidelying   External Rotation  Left;20 reps;Weights    External Rotation Weight (lbs)  1    ABduction  Left;20 reps    ABduction Limitations  worked small arc in 2 sections      Shoulder Exercises: Standing   External Rotation  Both;20 reps;Theraband    Theraband Level (Shoulder External Rotation)  Level 2 (Red)  Flexion  AAROM;20 reps    Flexion Limitations  wall slides to head high    Extension  20 reps;Theraband    Theraband Level (Shoulder Extension)  Level 2 (Red)    Row  20 reps;Theraband    Theraband Level (Shoulder Row)  Level 2 (Red)    Other Standing Exercises  a little approximation with ball on wall isometric circles      Shoulder Exercises: ROM/Strengthening   UBE (Upper Arm Bike)  level 3 x 6 minutes    Lat Pull  1.5 plate;20 reps    Cybex Row  1.5 plate;20 reps    Cybex Row Limitations  limited the ROM as higher caused pain      Electrical Stimulation   Electrical Stimulation Location  left shoulder    Electrical Stimulation Action  IFC    Electrical Stimulation Parameters  supine    Electrical Stimulation Goals  Pain                  PT Long Term Goals - 11/04/18 1100      PT LONG TERM GOAL #2   Title  report able to dress and do hair without difficulty    Status  On-going      PT LONG TERM GOAL #3   Title  increase left shoulder AROM for flexion to 140 degrees    Status  On-going            Plan - 11/04/18 1054    Clinical Impression Statement  Patient seems to be improving, demonstrated by  increase ROM and reports of easier dressing, she is still hesitant and has pain with reaching and activities    PT Next Visit Plan  continue to work on stability of the shoulder and function    Consulted and Agree with Plan of Care  Patient       Patient will benefit from skilled therapeutic intervention in order to improve the following deficits and impairments:  Pain, Improper body mechanics, Increased muscle spasms, Postural dysfunction, Impaired UE functional use, Decreased strength, Decreased range of motion  Visit Diagnosis: Acute pain of left shoulder  Stiffness of left shoulder, not elsewhere classified     Problem List Patient Active Problem List   Diagnosis Date Noted  . 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 Genetic testing 02/27/2018  . Family history of genetic disease carrier   . Family history of breast cancer   . Family history of colon cancer   . Head injury with loss of consciousness ([place]) 11/20/2017  . Nausea 11/20/2017  . 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 Tremor observed on examination 11/20/2017  . Left shoulder pain 02/18/2017  . MDD (major depressive disorder), recurrent severe, without psychosis ([place]) 12/28/2016  . Muscle strain 09/19/2016  . Right foot injury, subsequent encounter 08/25/2016  . Low back pain 06/27/2016  . Left wrist injury, subsequent encounter 06/27/2016  . Pain of left thumb 10/07/2015  . 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 Strain of left thumb 02/11/2015  . Strain of right forearm 02/11/2015  . Right shoulder pain 12/31/2014  . Injury of right little finger 12/31/2014  . Episodic cluster headache, 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  . 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 Hypersomnia, recurrent 12/02/2014  . Migraine aura, persistent,  intractable, with status migrainosus 12/02/2014  . Lower  back injury 09/30/2014  . Bipolar I disorder, most recent episode depressed ([place])   . MDD (major depressive disorder), recurrent, severe, with psychosis ([place]) 09/23/2014  . Injury of left index finger 08/20/2014  . Right ankle sprain 06/01/2014  . Contusion, multiple sites 06/01/2014  . Strain of right gastrocnemius muscle 06/01/2014  . Phonophobia 05/04/2014  . Photophobia of both eyes 05/04/2014  . Emotionally unstable borderline personality disorder ([place]) 05/04/2014  . Nausea with vomiting 05/04/2014  . Mixed bipolar I disorder ([place])   . Bipolar I disorder, most recent episode mixed ([place]) 04/18/2014  . Bipolar affective disorder, depressed, mild ([place]) 04/12/2014  . Suicidal ideation 04/12/2014  . Injury of right shoulder and upper arm 02/17/2014  . Left leg pain 06/03/2013  . Right hip pain 06/03/2013  . Migraine with status migrainosus 01/08/2013  . Personality disorder ([place])   . Chronic migraine 05/08/2012  . Contact dermatitis 11/27/2011  . Major depressive disorder, recurrent episode ([place]) 10/27/2011  . Generalized anxiety disorder 10/27/2011  . 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 Borderline personality disorder ([place]) 10/27/2011  . Right foot pain 09/28/2011  . 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 Ganglion cyst 09/29/2009  . PES PLANUS 07/01/2008  . BIPOLAR DISORDER UNSPECIFIED 06/09/2008    Sumner Boast., PT 11/04/2018, 11:03 AM  [address] [place], [place], [address] Phone: [phone]   Fax:  [phone]  Name: JAMARIAH TONY MRN: [id number] Date of Birth: 23-Oct-1970

## 2018-11-06 ENCOUNTER — Other Ambulatory Visit: Payer: Self-pay

## 2018-11-06 ENCOUNTER — Ambulatory Visit: Payer: PPO | Admitting: Physical Therapy

## 2018-11-06 ENCOUNTER — Encounter: Payer: Self-pay | Admitting: Family Medicine

## 2018-11-06 ENCOUNTER — Ambulatory Visit (INDEPENDENT_AMBULATORY_CARE_PROVIDER_SITE_OTHER): Payer: PPO | Admitting: Family Medicine

## 2018-11-06 VITALS — BP 100/70 | Ht 70.0 in | Wt 250.0 lb

## 2018-11-06 DIAGNOSIS — M25512 Pain in left shoulder: Secondary | ICD-10-CM | POA: Diagnosis not present

## 2018-11-06 NOTE — Patient Instructions (Signed)
Continue with your physical therapy, meloxicam. Tylenol, icing if needed also. We will go ahead with an MRI arthrogram to assess for a labral tear of your shoulder given your continued instability and not progressing as much as we'd hoped to this point.

## 2018-11-06 NOTE — Progress Notes (Signed)
PCP: Aretta Nip, MD  Subjective:   HPI: Patient is a 48 y.o. female here for left shoulder pain.  8/12: Patient reports about 1 week ago she fell down on concrete steps with left arm catching herself on the step. Immediate pain in deep left shoulder that is sharp and can be severe, worse with motion. Feels like it's not in the right place. Taken ibuprofen which helps some. No skin changes, swelling, numbness.  8/26: Patient reports she feels about the same compared to last visit. Has been using sling regularly. Taking meloxicam as needed. Shoulder still feels like it's going to slip out of place. No skin changes.  10/7: Patient has done 6 visits of therapy and notes very mild progress since last visit. Still feeling like shoulder is going to fall out of place and only slightly less on this front. Doing isometrics at home. Taking mobic which helps with her pain. No radiation, numbness.  Past Medical History:  Diagnosis Date  . Achilles tendinitis   . Achilles tendinitis   . ADHD (attention deficit hyperactivity disorder)   . Allergy   . Arthritis   . Bipolar affective ([place])   . Bipolar disorder ([place])   . Cataracts, bilateral   . Eczema   . Family history of breast cancer   . Family history of colon cancer   . Family history of genetic disease carrier   . Ganglion cyst 09/29/2009   left wrist (2 cyst)  . Hyperprolactinemia ([place])   . Hypertension   . Lipoma   . Migraine   . Personality disorder ([place])   . Pes planus     Current Outpatient Medications on File Prior to Visit  Medication Sig Dispense Refill  . clobetasol cream (TEMOVATE) 0.05 %   3  . divalproex (DEPAKOTE ER) 500 MG 24 hr tablet TAKE 2 TABLETS BY MOUTH AT BEDTIME FOR MOOD CONTROL 60 tablet 5  . Galcanezumab-gnlm (EMGALITY) 120 MG/ML SOAJ Inject 120 mg into the skin every 30 (thirty) days. 1 mL 11  . hydrOXYzine (VISTARIL) 50 MG capsule TK ONE C PO TID PRA  2  . lamoTRIgine (LAMICTAL) 150 MG  tablet Take 300 mg by mouth at bedtime.  1  . levocetirizine (XYZAL) 5 MG tablet Take 5 mg by mouth every evening.  1  . lurasidone (LATUDA) 80 MG TABS tablet Take 1 tablet (80 mg total) by mouth daily with supper. For mood control 30 tablet 0  . meloxicam (MOBIC) 15 MG tablet TK 1 T PO QD    . metoprolol succinate (TOPROL-XL) 100 MG 24 hr tablet Take 1 tablet (100 mg total) by mouth daily. Take with or immediately following a meal. 30 tablet 0  . promethazine (PHENERGAN) 25 MG tablet TK 1 T PO BID FOR 15 DAYS PRN  0  . SUMAtriptan Succinate Refill 6 MG/0.5ML SOCT Inject 0.5 ml SQ at the onset of migraine. May repeat in 2 hours if needed. 0.5 mL 5  . traZODone (DESYREL) 50 MG tablet Take 1 tablet (50 mg total) by mouth at bedtime as needed for sleep. (Patient taking differently: Take 100 mg by mouth. ) 30 tablet 0   Current Facility-Administered Medications on File Prior to Visit  Medication Dose Route Frequency Provider Last Rate Last Dose  . methylPREDNISolone acetate (DEPO-MEDROL) injection 40 mg  40 mg Intra-articular Once Lycia Sachdeva, Sharyn Lull, MD      . valproate (DEPACON) 1,000 mg in dextrose 5 % 50 mL IVPB  1,000 mg  Intravenous Once Dohmeier, Asencion Partridge, MD        Past Surgical History:  Procedure Laterality Date  . ANKLE SURGERY  12/88   left   . chest nodule  1990?   rt chest wall nodule removal  . GANGLION CYST EXCISION  2011  . lipoma removal    . right bunioectomy    . SHOULDER SURGERY  01/13/2011   right, partial tear  . tumor resection left thigh      Allergies  Allergen Reactions  . Adhesive [Tape] Itching and Rash    Also reacted to Steri Strips and Band-Aids.  . Dilaudid [Hydromorphone Hcl] Itching  . Morphine Nausea And Vomiting  . Penicillins Hives    Has patient had a PCN reaction causing immediate rash, facial/tongue/throat swelling, SOB or lightheadedness with hypotension: YES Has patient had a PCN reaction causing severe rash involving mucus membranes or skin  necrosis: NO Has patient had a PCN reaction that required hospitalization NO Has patient had a PCN reaction occurring within the last 10 years:NO If all of the above answers are "NO", then may proceed with Cephalosporin use.  Marland Kitchen Percocet [Oxycodone-Acetaminophen] Itching  . Prednisone Hives  . Provera [Medroxyprogesterone Acetate] Other (See Comments)    Causes manic episodes  . Ultram [Tramadol Hcl] Itching    Social History   Socioeconomic History  . Marital status: Single    Spouse name: Not on file  . Number of children: 0  . Years of education: Not on file  . Highest education level: Not on file  Occupational History    Employer: [employer]  . Financial resource strain: Not on file  . Food insecurity    Worry: Not on file    Inability: Not on file  . Transportation needs    Medical: Not on file    Non-medical: Not on file  Tobacco Use  . Smoking status: Never Smoker  . Smokeless tobacco: Never Used  Substance and Sexual Activity  . Alcohol use: No    Alcohol/week: 0.0 standard drinks  . Drug use: No  . Sexual activity: Never    Birth control/protection: Pill  Lifestyle  . Physical activity    Days per week: Not on file    Minutes per session: Not on file  . Stress: Not on file  Relationships  . Social Herbalist on phone: Not on file    Gets together: Not on file    Attends religious service: Not on file    Active member of club or organization: Not on file    Attends meetings of clubs or organizations: Not on file    Relationship status: Not on file  . Intimate partner violence    Fear of current or ex partner: Not on file    Emotionally abused: Not on file    Physically abused: Not on file    Forced sexual activity: Not on file  Other Topics Concern  . Not on file  Social History Narrative   Caffeine  2 sodas daily, 1 cup coffee daily.    Family History  Problem Relation Age of Onset  . Hypertension Mother   . Hyperlipidemia  Mother   . Breast cancer Mother 13       genetic testing- CHEK2 likely pathogenic variant  . Heart attack Father   . Heart disease Father   . Hypertension Father   . Bipolar disorder Father   . Diabetes Paternal Grandfather   .  Heart disease Maternal Aunt   . Breast cancer Maternal Aunt 65  . Heart disease Maternal Grandmother   . Colon cancer Maternal Grandmother 66  . Colon cancer Maternal Uncle 75  . Lymphoma Maternal Grandfather 54  . Breast cancer Maternal Aunt 65  . Breast cancer Maternal Aunt 35  . Breast cancer Maternal Aunt   . Bone cancer Cousin 17  . Cervical cancer Cousin        Genetic testing- 'was positive for a gene' had a mastectomy    BP 100/70   Ht 5' 10"  (1.778 m)   Wt 250 lb (113.4 kg)   BMI 35.87 kg/m   Review of Systems: See HPI above.     Objective:  Physical Exam:  Gen: NAD, comfortable in exam room  Left shoulder: No swelling, ecchymoses.  No gross deformity. Mild TTP diffusely posterior > anterior shoulder. FROM passively but actively only to 90 degrees abduction and flexion with pain.  Full IR and ER. Pain with Wynonia Musty. Strength 5/5 with empty can and resisted internal/external rotation.  Pain with ER and empty can. Pain with apprehension.  + sulcus.  + o'briens NV intact distally.   Assessment & Plan:  1. Left shoulder injury - negative radiographs.  Not much improvement in stability with physical therapy and home exercises to date.  Positive o'briens for labral tear.  Advised we go ahead with MRI arthrogram at this time to assess for concurrent labral tear.  Meloxicam, continue PT in meantime.

## 2018-11-07 ENCOUNTER — Encounter: Payer: Self-pay | Admitting: Physical Therapy

## 2018-11-07 ENCOUNTER — Ambulatory Visit: Payer: PPO | Admitting: Physical Therapy

## 2018-11-07 DIAGNOSIS — M25612 Stiffness of left shoulder, not elsewhere classified: Secondary | ICD-10-CM

## 2018-11-07 DIAGNOSIS — M25512 Pain in left shoulder: Secondary | ICD-10-CM
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
## 2018-11-11 ENCOUNTER — Other Ambulatory Visit: Payer: Self-pay

## 2018-11-11 ENCOUNTER — Encounter: Payer: Self-pay | Admitting: Physical Therapy

## 2018-11-11 ENCOUNTER — Ambulatory Visit: Payer: PPO | Admitting: Physical Therapy

## 2018-11-11 DIAGNOSIS — M25512 Pain in left shoulder: Secondary | ICD-10-CM | POA: Diagnosis not present

## 2018-11-11 DIAGNOSIS — M25612 Stiffness of left shoulder, not elsewhere classified: Secondary | ICD-10-CM

## 2018-11-11 NOTE — Therapy (Signed)
[address] [place], [place], [address] Phone: [phone]   Fax:  [phone]  Physical Therapy Treatment  Patient Details  Name: [name] MRN: [id number] Date of Birth: October 07, 1970 Referring Provider (PT): [place], [place]   Encounter Date: 11/11/2018  PT End of Session - 11/11/18 1007    Visit Number  8    Date for PT Re-Evaluation  12/14/18    PT Start Time  0931    PT Stop Time  1018    PT Time Calculation (min)  47 min    Activity Tolerance  Patient limited by pain    Behavior During Therapy  [facility] for tasks assessed/performed       Past Medical History:  Diagnosis Date  . Achilles tendinitis   . Achilles tendinitis   . ADHD (attention deficit hyperactivity disorder)   . Allergy   . Arthritis   . Bipolar affective ([place])   . Bipolar disorder ([place])   . Cataracts, bilateral   . Eczema   . Family history of breast cancer   . Family history of colon cancer   . Family history of genetic disease carrier   . Ganglion cyst 09/29/2009   left wrist (2 cyst)  . Hyperprolactinemia ([place])   . Hypertension   . Lipoma   . Migraine   . Personality disorder ([place])   . Pes planus     Past Surgical History:  Procedure Laterality Date  . ANKLE SURGERY  12/88   left   . chest nodule  1990?   rt chest wall nodule removal  . GANGLION CYST EXCISION  2011  . lipoma removal    . right bunioectomy    . SHOULDER SURGERY  01/13/2011   right, partial tear  . tumor resection left thigh      There were no vitals filed for this visit.  Subjective Assessment - 11/11/18 0938    Subjective  Patient reports that she is really sore and has had increased feelings of it subluxing.  She will have an arthrogram on 11/20/18    Currently in Pain?  Yes    Pain Score  8     Pain Location  Shoulder    Pain Orientation  Left    Pain Descriptors / Indicators  Sore                       [facility]  Adult PT Treatment/Exercise - 11/11/18 0001      Shoulder Exercises: Supine   Other Supine Exercises  assisted punches , small isometric circles, 1#    Other Supine Exercises  3# serratus punch      Shoulder Exercises: Sidelying   External Rotation  Left;20 reps;Weights    External Rotation Weight (lbs)  1      Shoulder Exercises: Standing   External Rotation  Both;20 reps;Theraband    Theraband Level (Shoulder External Rotation)  Level 2 (Red)    Extension  20 reps;Theraband    Theraband Level (Shoulder Extension)  Level 2 (Red)    Row  20 reps;Theraband    Theraband Level (Shoulder Row)  Level 2 (Red)    Other Standing Exercises  a little approximation with ball on wall isometric circles      Shoulder Exercises: ROM/Strengthening   UBE (Upper Arm Bike)  level 3 x 6 minutes    Lat Pull  1.5 plate;20 reps    Lat Pull Limitations  small ROM     Cybex Row  1.5 plate;20 reps      Shoulder Exercises: Isometric Strengthening   Flexion  3X5"    Extension  3X5"    External Rotation  3X5"    Internal Rotation  3X5"    ABduction  3X5"    ADduction  3X5"      Moist Heat Therapy   Number Minutes Moist Heat  15 Minutes    Moist Heat Location  Shoulder   scapular area     Electrical Stimulation   Electrical Stimulation Location  left shoulder and rhomboid area    Electrical Stimulation Action  IFC    Electrical Stimulation Parameters  supine    Electrical Stimulation Goals  Pain                  PT Long Term Goals - 11/11/18 1009      PT LONG TERM GOAL #1   Title  decrease pain in the left shoulder 50%    Status  On-going      PT LONG TERM GOAL #2   Title  report able to dress and do hair without difficulty    Status  On-going      PT LONG TERM GOAL #3   Title  increase left shoulder AROM for flexion to 140 degrees    Status  On-going            Plan - 11/11/18 1008    Clinical Impression Statement  Patient continues with pain and fear of subluxation,  she is planning on having an arthrogram next week, we will continue with light exercises to try to stabilize until that time    PT Next Visit Plan  continue to work on stability of the shoulder and function    Consulted and Agree with Plan of Care  Patient       Patient will benefit from skilled therapeutic intervention in order to improve the following deficits and impairments:  Pain, Improper body mechanics, Increased muscle spasms, Postural dysfunction, Impaired UE functional use, Decreased strength, Decreased range of motion  Visit Diagnosis: Acute pain of left shoulder  Stiffness of left shoulder, not elsewhere classified     Problem List Patient Active Problem List   Diagnosis Date Noted  . 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 Genetic testing 02/27/2018  . Family history of genetic disease carrier   . Family history of breast cancer   . Family history of colon cancer   . Head injury with loss of consciousness ([place]) 11/20/2017  . Nausea 11/20/2017  . 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 Tremor observed on examination 11/20/2017  . Left shoulder pain 02/18/2017  . MDD (major depressive disorder), recurrent severe, without psychosis ([place]) 12/28/2016  . Muscle strain 09/19/2016  . Right foot injury, subsequent encounter 08/25/2016  . Low back pain 06/27/2016  . Left wrist injury, subsequent encounter 06/27/2016  . Pain of left thumb 10/07/2015  . 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 Strain of left thumb 02/11/2015  . Strain of right forearm 02/11/2015  . Right shoulder pain 12/31/2014  . Injury of right little finger 12/31/2014  . Episodic cluster headache, 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  . 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 Hypersomnia, recurrent 12/02/2014  . Migraine aura,  persistent, intractable, with status migrainosus 12/02/2014  .  Lower back injury 09/30/2014  . Bipolar I disorder, most recent episode depressed ([place])   . MDD (major depressive disorder), recurrent, severe, with psychosis ([place]) 09/23/2014  . Injury of left index finger 08/20/2014  . Right ankle sprain 06/01/2014  . Contusion, multiple sites 06/01/2014  . Strain of right gastrocnemius muscle 06/01/2014  . Phonophobia 05/04/2014  . Photophobia of both eyes 05/04/2014  . Emotionally unstable borderline personality disorder ([place]) 05/04/2014  . Nausea with vomiting 05/04/2014  . Mixed bipolar I disorder ([place])   . Bipolar I disorder, most recent episode mixed ([place]) 04/18/2014  . Bipolar affective disorder, depressed, mild ([place]) 04/12/2014  . Suicidal ideation 04/12/2014  . Injury of right shoulder and upper arm 02/17/2014  . Left leg pain 06/03/2013  . Right hip pain 06/03/2013  . Migraine with status migrainosus 01/08/2013  . Personality disorder ([place])   . Chronic migraine 05/08/2012  . Contact dermatitis 11/27/2011  . Major depressive disorder, recurrent episode ([place]) 10/27/2011  . Generalized anxiety disorder 10/27/2011  . 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 Borderline personality disorder ([place]) 10/27/2011  . Right foot pain 09/28/2011  . 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 Ganglion cyst 09/29/2009  . PES PLANUS 07/01/2008  . BIPOLAR DISORDER UNSPECIFIED 06/09/2008    Sumner Boast., PT 11/11/2018, 10:10 AM  [address] [place], [place], [address] Phone: [phone]   Fax:  [phone]  Name: [name] MRN: [id number] Date of Birth: 1970/05/14

## 2018-11-13 ENCOUNTER — Ambulatory Visit: Payer: PPO | Admitting: Physical Therapy

## 2018-11-13 ENCOUNTER — Encounter: Payer: Self-pay | Admitting: Physical Therapy

## 2018-11-13 ENCOUNTER — Other Ambulatory Visit: Payer: Self-pay

## 2018-11-13 DIAGNOSIS — M25512 Pain in left shoulder: Secondary | ICD-10-CM | POA: Diagnosis not present

## 2018-11-13 DIAGNOSIS — M25612 Stiffness of left shoulder, not elsewhere classified: Secondary | ICD-10-CM

## 2018-11-13 NOTE — Therapy (Signed)
[place] [place], [place], [address] Phone: [phone]   Fax:  [phone]  Physical Therapy Treatment  Patient Details  Name: [name] MRN: [id number] Date of Birth: 12-Aug-1970 Referring Provider (PT): [place], [place]   Encounter Date: 11/13/2018  PT End of Session - 11/13/18 1043    Visit Number  9    Date for PT Re-Evaluation  12/14/18    PT Start Time  1014    PT Stop Time  1100    PT Time Calculation (min)  46 min    Activity Tolerance  Patient limited by pain    Behavior During Therapy  [facility] for tasks assessed/performed       Past Medical History:  Diagnosis Date  . Achilles tendinitis   . Achilles tendinitis   . ADHD (attention deficit hyperactivity disorder)   . Allergy   . Arthritis   . Bipolar affective ([place])   . Bipolar disorder ([place])   . Cataracts, bilateral   . Eczema   . Family history of breast cancer   . Family history of colon cancer   . Family history of genetic disease carrier   . Ganglion cyst 09/29/2009   left wrist (2 cyst)  . Hyperprolactinemia ([place])   . Hypertension   . Lipoma   . Migraine   . Personality disorder ([place])   . Pes planus     Past Surgical History:  Procedure Laterality Date  . ANKLE SURGERY  12/88   left   . chest nodule  1990?   rt chest wall nodule removal  . GANGLION CYST EXCISION  2011  . lipoma removal    . right bunioectomy    . SHOULDER SURGERY  01/13/2011   right, partial tear  . tumor resection left thigh      There were no vitals filed for this visit.  Subjective Assessment - 11/13/18 1020    Subjective  Continues to have pain and instability    Currently in Pain?  Yes    Pain Score  7     Pain Location  Shoulder    Pain Orientation  Left    Aggravating Factors   reaching                       [facility] Adult PT Treatment/Exercise - 11/13/18 0001      Shoulder Exercises: Supine   Other Supine  Exercises  assisted punches , small isometric circles, 1#    Other Supine Exercises  3# serratus punch      Shoulder Exercises: Sidelying   External Rotation  Left;20 reps;Weights    External Rotation Weight (lbs)  1    ABduction  Left;20 reps    ABduction Limitations  worked small arc in 2 sections      Shoulder Exercises: Standing   External Rotation  Both;20 reps;Theraband    Theraband Level (Shoulder External Rotation)  Level 2 (Red)    Internal Rotation  Left;20 reps;Theraband    Theraband Level (Shoulder Internal Rotation)  Level 2 (Red)    Extension  20 reps;Theraband    Theraband Level (Shoulder Extension)  Level 2 (Red)    Row  20 reps;Theraband    Theraband Level (Shoulder Row)  Level 2 (Red)    Other Standing Exercises  a little approximation with ball on wall isometric circles      Shoulder Exercises: ROM/Strengthening   UBE (Upper Arm Bike)  level 3 x 6 minutes    Lat Pull  1.5 plate;20 reps    Lat Pull Limitations  small ROM     Cybex Row  1.5 plate;20 reps      Shoulder Exercises: Isometric Strengthening   Flexion  3X5"    Extension  3X5"    External Rotation  3X5"    Internal Rotation  3X5"    ABduction  3X5"    ADduction  3X5"      Moist Heat Therapy   Number Minutes Moist Heat  15 Minutes    Moist Heat Location  Shoulder      Electrical Stimulation   Electrical Stimulation Location  left shoulder and rhomboid area    Electrical Stimulation Action  IFC    Electrical Stimulation Parameters  supine    Electrical Stimulation Goals  Pain                  PT Long Term Goals - 11/13/18 1045      PT LONG TERM GOAL #1   Title  decrease pain in the left shoulder 50%    Status  On-going      PT LONG TERM GOAL #2   Title  report able to dress and do hair without difficulty    Status  On-going      PT LONG TERM GOAL #3   Title  increase left shoulder AROM for flexion to 140 degrees    Status  On-going            Plan - 11/13/18 1043     Clinical Impression Statement  Patient continues to be limited by pain and fear of subluxation, we have tried to do basic stabilization to help with teh shoulder stability and the pain, all activities do increase her pain.  She is planning on arthrogram next week, we will not increase anything until the results are in and the MD approves    PT Next Visit Plan  continue to work on stability of the shoulder and function    Consulted and Agree with Plan of Care  Patient       Patient will benefit from skilled therapeutic intervention in order to improve the following deficits and impairments:  Pain, Improper body mechanics, Increased muscle spasms, Postural dysfunction, Impaired UE functional use, Decreased strength, Decreased range of motion  Visit Diagnosis: Acute pain of left shoulder  Stiffness of left shoulder, not elsewhere classified     Problem List Patient Active Problem List   Diagnosis Date Noted  . 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 Genetic testing 02/27/2018  . Family history of genetic disease carrier   . Family history of breast cancer   . Family history of colon cancer   . Head injury with loss of consciousness ([place]) 11/20/2017  . Nausea 11/20/2017  . 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 Tremor observed on examination 11/20/2017  . Left shoulder pain 02/18/2017  . MDD (major depressive disorder), recurrent severe, without psychosis ([place]) 12/28/2016  . Muscle strain 09/19/2016  . Right foot injury, subsequent encounter 08/25/2016  . Low back pain 06/27/2016  . Left wrist injury, subsequent encounter 06/27/2016  . Pain of left thumb 10/07/2015  . 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 Strain of left thumb 02/11/2015  . Strain of right forearm 02/11/2015  . Right shoulder pain 12/31/2014  . Injury of  right little finger 12/31/2014  .  Episodic cluster headache, 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  . 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 Hypersomnia, recurrent 12/02/2014  . Migraine aura, persistent, intractable, with status migrainosus 12/02/2014  . Lower back injury 09/30/2014  . Bipolar I disorder, most recent episode depressed ([place])   . MDD (major depressive disorder), recurrent, severe, with psychosis ([place]) 09/23/2014  . Injury of left index finger 08/20/2014  . Right ankle sprain 06/01/2014  . Contusion, multiple sites 06/01/2014  . Strain of right gastrocnemius muscle 06/01/2014  . Phonophobia 05/04/2014  . Photophobia of both eyes 05/04/2014  . Emotionally unstable borderline personality disorder ([place]) 05/04/2014  . Nausea with vomiting 05/04/2014  . Mixed bipolar I disorder ([place])   . Bipolar I disorder, most recent episode mixed ([place]) 04/18/2014  . Bipolar affective disorder, depressed, mild ([place]) 04/12/2014  . Suicidal ideation 04/12/2014  . Injury of right shoulder and upper arm 02/17/2014  . Left leg pain 06/03/2013  . Right hip pain 06/03/2013  . Migraine with status migrainosus 01/08/2013  . Personality disorder ([place])   . Chronic migraine 05/08/2012  . Contact dermatitis 11/27/2011  . Major depressive disorder, recurrent episode ([place]) 10/27/2011  . Generalized anxiety disorder 10/27/2011  . 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 Borderline personality disorder ([place]) 10/27/2011  . Right foot pain 09/28/2011  . 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 Ganglion cyst 09/29/2009  . PES PLANUS 07/01/2008  . BIPOLAR DISORDER UNSPECIFIED 06/09/2008    Sumner Boast., PT 11/13/2018, 10:46 AM  [address] [place], [place], [address] Phone: [phone]   Fax:   [phone]  Name: [name] MRN: [id number] Date of Birth: December 30, 1970

## 2018-11-14 DIAGNOSIS — J301 Allergic rhinitis due to pollen: Secondary | ICD-10-CM | POA: Diagnosis not present

## 2018-11-14 DIAGNOSIS — R21 Rash and other nonspecific skin eruption: Secondary | ICD-10-CM | POA: Diagnosis not present

## 2018-11-14 DIAGNOSIS — J3089 Other allergic rhinitis: Secondary | ICD-10-CM | POA: Diagnosis not present

## 2018-11-18 ENCOUNTER — Ambulatory Visit: Payer: PPO | Admitting: Physical Therapy

## 2018-11-19 ENCOUNTER — Ambulatory Visit: Payer: PPO | Admitting: Physical Therapy

## 2018-11-20 ENCOUNTER — Other Ambulatory Visit: Payer: Self-pay

## 2018-11-20 ENCOUNTER — Ambulatory Visit
Admission: RE | Admit: 2018-11-20 | Discharge: 2018-11-20 | Disposition: A | Discharge status: Home / Self Care | Payer: PPO | Source: Ambulatory Visit | Attending: Family Medicine | Admitting: Family Medicine

## 2018-11-20 DIAGNOSIS — M25512 Pain in left shoulder: Secondary | ICD-10-CM

## 2018-11-25 ENCOUNTER — Ambulatory Visit: Payer: PPO | Admitting: Family Medicine

## 2018-11-25 DIAGNOSIS — M25512 Pain in left shoulder: Secondary | ICD-10-CM | POA: Diagnosis not present

## 2018-12-02 ENCOUNTER — Ambulatory Visit: Payer: PPO | Attending: Family Medicine | Admitting: Physical Therapy

## 2018-12-02 ENCOUNTER — Encounter: Payer: Self-pay | Admitting: Physical Therapy

## 2018-12-02 ENCOUNTER — Other Ambulatory Visit: Payer: Self-pay

## 2018-12-02 DIAGNOSIS — M542 Cervicalgia: Secondary | ICD-10-CM | POA: Insufficient documentation

## 2018-12-02 DIAGNOSIS — M25612 Stiffness of left shoulder, not elsewhere classified: Secondary | ICD-10-CM | POA: Diagnosis not present

## 2018-12-02 DIAGNOSIS — R252 Cramp and spasm: Secondary | ICD-10-CM | POA: Insufficient documentation

## 2018-12-02 DIAGNOSIS — M25512 Pain in left shoulder: Secondary | ICD-10-CM | POA: Diagnosis not present

## 2018-12-02 NOTE — Therapy (Signed)
[address] [place], [place], [address] Phone: [phone]   Fax:  [phone] Progress Note Reporting Period 10/14/18 to 12/02/18 for the first 10 visits  See note below for Objective Data and Assessment of Progress/Goals.      Physical Therapy Treatment  Patient Details  Name: [name] MRN: [id number] Date of Birth: [birth year] Referring Provider (PT): [place], [place]   Encounter Date: 12/02/2018  PT End of Session - 12/02/18 1048    Visit Number  10    Date for PT Re-Evaluation  12/14/18    PT Start Time  1008    PT Stop Time  1102    PT Time Calculation (min)  54 min    Activity Tolerance  Patient tolerated treatment well    Behavior During Therapy  [facility] for tasks assessed/performed;Anxious       Past Medical History:  Diagnosis Date  . Achilles tendinitis   . Achilles tendinitis   . ADHD (attention deficit hyperactivity disorder)   . Allergy   . Arthritis   . Bipolar affective ([place])   . Bipolar disorder ([place])   . Cataracts, bilateral   . Eczema   . Family history of breast cancer   . Family history of colon cancer   . Family history of genetic disease carrier   . Ganglion cyst 09/29/2009   left wrist (2 cyst)  . Hyperprolactinemia ([place])   . Hypertension   . Lipoma   . Migraine   . Personality disorder ([place])   . Pes planus     Past Surgical History:  Procedure Laterality Date  . ANKLE SURGERY  12/88   left   . chest nodule  1990?   rt chest wall nodule removal  . GANGLION CYST EXCISION  2011  . lipoma removal    . right bunioectomy    . SHOULDER SURGERY  01/13/2011   right, partial tear  . tumor resection left thigh      There were no vitals filed for this visit.  Subjective Assessment - 12/02/18 1010    Subjective  Patient reports that she saw the surgeon felt like it did not warrant surgery at this time, she is out of work until early December and needs  strength, less feeling of the shoulder feeling like it is slipping out.    Currently in Pain?  Yes    Pain Score  5     Pain Location  Shoulder    Pain Orientation  Left    Aggravating Factors   reaching, pulling         [facility] PT Assessment - 12/02/18 0001      AROM   Left Shoulder Flexion  105 Degrees    Left Shoulder ABduction  90 Degrees    Left Shoulder Internal Rotation  50 Degrees    Left Shoulder External Rotation  40 Degrees                   [facility] Adult PT Treatment/Exercise - 12/02/18 0001      Shoulder Exercises: Seated   Extension  Left;20 reps    Extension Weight (lbs)  2    Row  Left;20 reps;Weights    Row Weight (lbs)  3    Protraction  Left;20 reps    Protraction Weight (lbs)  3      Shoulder Exercises: Standing   External Rotation  Both;20 reps;Theraband    Theraband Level (Shoulder  External Rotation)  Level 2 (Red)    Internal Rotation  Left;20 reps;Theraband    Theraband Level (Shoulder Internal Rotation)  Level 2 (Red)    Extension  20 reps;Theraband    Theraband Level (Shoulder Extension)  Level 2 (Red)    Row  20 reps;Theraband    Theraband Level (Shoulder Row)  Level 2 (Red)    Other Standing Exercises  ball vs wall approximation    Other Standing Exercises  wall slides wiht cues to not go too high due to pain      Shoulder Exercises: ROM/Strengthening   UBE (Upper Arm Bike)  level 3 x 6 minutes    Lat Pull  1.5 plate;20 reps    Cybex Row  1.5 plate;20 reps      Shoulder Exercises: Isometric Strengthening   Flexion  3X5"    Extension  3X5"    External Rotation  3X5"    Internal Rotation  3X5"    ABduction  3X5"    ADduction  3X5"      Moist Heat Therapy   Number Minutes Moist Heat  15 Minutes    Moist Heat Location  Shoulder      Electrical Stimulation   Electrical Stimulation Location  left shoulder and rhomboid area    Electrical Stimulation Action  IFC    Electrical Stimulation Parameters  supin    Electrical  Stimulation Goals  Pain                  PT Long Term Goals - 12/02/18 1051      PT LONG TERM GOAL #1   Title  decrease pain in the left shoulder 50%    Status  On-going      PT LONG TERM GOAL #2   Title  report able to dress and do hair without difficulty    Status  Partially Met      PT LONG TERM GOAL #3   Title  increase left shoulder AROM for flexion to 140 degrees    Status  On-going            Plan - 12/02/18 1049    Clinical Impression Statement  Patient was out for over 2 weeks, she is back after seeing the surgeon who does not think that she needs surgery at this time, she is back to strengthen the left shoulder .  She is anxious as she has had fear of subluxation and dislocation.  Needs cues verbal and tactile to keep elbow in and get good retraction and RC stability    PT Next Visit Plan  resume work on shoulder stability    Consulted and Agree with Plan of Care  Patient       Patient will benefit from skilled therapeutic intervention in order to improve the following deficits and impairments:  Pain, Improper body mechanics, Increased muscle spasms, Postural dysfunction, Impaired UE functional use, Decreased strength, Decreased range of motion  Visit Diagnosis: Acute pain of left shoulder  Stiffness of left shoulder, not elsewhere classified     Problem List Patient Active Problem List   Diagnosis Date Noted  . 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 Genetic testing 02/27/2018  . Family history of genetic disease carrier   . Family history of breast cancer   . Family history of colon cancer   . Head injury with loss of consciousness ([place]) 11/20/2017  . Nausea 11/20/2017  . 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 Intractable  persistent migraine aura with cerebral infarction and status migrainosus ([place]) 11/20/2017  . Tremor observed on examination 11/20/2017  . Left shoulder pain 02/18/2017  . MDD (major depressive disorder),  recurrent severe, without psychosis ([place]) 12/28/2016  . Muscle strain 09/19/2016  . Right foot injury, subsequent encounter 08/25/2016  . Low back pain 06/27/2016  . Left wrist injury, subsequent encounter 06/27/2016  . Pain of left thumb 10/07/2015  . 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 Strain of left thumb 02/11/2015  . Strain of right forearm 02/11/2015  . Right shoulder pain 12/31/2014  . Injury of right little finger 12/31/2014  . Episodic cluster headache, not intractable 12/02/2014  . 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 Hypersomnia, recurrent 12/02/2014  . Migraine aura, persistent, intractable, with status migrainosus 12/02/2014  . Lower back injury 09/30/2014  . Bipolar I disorder, most recent episode depressed ([place])   . MDD (major depressive disorder), recurrent, severe, with psychosis ([place]) 09/23/2014  . Injury of left index finger 08/20/2014  . Right ankle sprain 06/01/2014  . Contusion, multiple sites 06/01/2014  . Strain of right gastrocnemius muscle 06/01/2014  . Phonophobia 05/04/2014  . Photophobia of both eyes 05/04/2014  . Emotionally unstable borderline personality disorder ([place]) 05/04/2014  . Nausea with vomiting 05/04/2014  . Mixed bipolar I disorder ([place])   . Bipolar I disorder, most recent episode mixed ([place]) 04/18/2014  . Bipolar affective disorder, depressed, mild ([place]) 04/12/2014  . Suicidal ideation 04/12/2014  . Injury of right shoulder and upper arm 02/17/2014  . Left leg pain 06/03/2013  . Right hip pain 06/03/2013  . Migraine with status migrainosus 01/08/2013  . Personality disorder ([place])   . Chronic migraine 05/08/2012  . Contact dermatitis 11/27/2011  . Major depressive disorder, recurrent episode ([place]) 10/27/2011  . Generalized anxiety disorder 10/27/2011  . 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 Borderline personality  disorder ([place]) 10/27/2011  . Right foot pain 09/28/2011  . 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 Ganglion cyst 09/29/2009  . PES PLANUS 07/01/2008  . BIPOLAR DISORDER UNSPECIFIED 06/09/2008    Sumner Boast., PT 12/02/2018, 10:52 AM  [address] [place], [place], [address] Phone: [phone]   Fax:  [phone]  Name: [name] MRN: [id number] Date of Birth: January 26, 1971

## 2018-12-04 ENCOUNTER — Encounter: Payer: Self-pay | Admitting: Physical Therapy

## 2018-12-04 ENCOUNTER — Other Ambulatory Visit: Payer: Self-pay

## 2018-12-04 ENCOUNTER — Ambulatory Visit: Payer: PPO | Admitting: Physical Therapy

## 2018-12-04 DIAGNOSIS — M25512 Pain in left shoulder: Secondary | ICD-10-CM

## 2018-12-04 DIAGNOSIS — M25612 Stiffness of left shoulder, not elsewhere classified: Secondary | ICD-10-CM

## 2018-12-04 NOTE — Therapy (Signed)
[place], [place], [address] Phone: [phone]   Fax:  [phone]  Physical Therapy Treatment  Patient Details  Name: [name] MRN: [id number] Date of Birth: 06-28-1970 Referring Provider (PT): [place], [place]   Encounter Date: 12/04/2018  PT End of Session - 12/04/18 1218    Visit Number  11    Date for PT Re-Evaluation  12/14/18    PT Start Time  1147    PT Stop Time  1233    PT Time Calculation (min)  46 min    Activity Tolerance  Patient tolerated treatment well    Behavior During Therapy  [facility] for tasks assessed/performed;Anxious       Past Medical History:  Diagnosis Date  . Achilles tendinitis   . Achilles tendinitis   . ADHD (attention deficit hyperactivity disorder)   . Allergy   . Arthritis   . Bipolar affective ([place])   . Bipolar disorder ([place])   . Cataracts, bilateral   . Eczema   . Family history of breast cancer   . Family history of colon cancer   . Family history of genetic disease carrier   . Ganglion cyst 09/29/2009   left wrist (2 cyst)  . Hyperprolactinemia ([place])   . Hypertension   . Lipoma   . Migraine   . Personality disorder ([place])   . Pes planus     Past Surgical History:  Procedure Laterality Date  . ANKLE SURGERY  12/88   left   . chest nodule  1990?   rt chest wall nodule removal  . GANGLION CYST EXCISION  2011  . lipoma removal    . right bunioectomy    . SHOULDER SURGERY  01/13/2011   right, partial tear  . tumor resection left thigh      There were no vitals filed for this visit.  Subjective Assessment - 12/04/18 1150    Subjective  Pt reports that her arm is doing better but is does slip some    Currently in Pain?  Yes    Pain Score  3     Pain Location  Shoulder    Pain Orientation  Left                       [facility] Adult PT Treatment/Exercise - 12/04/18 0001      Shoulder Exercises: Standing   Extension  20 reps;Theraband    Theraband Level (Shoulder Extension)  Level 2 (Red)    Row  20 reps;Theraband    Theraband Level (Shoulder Row)  Level 2 (Red)    Other Standing Exercises  ball vs wall approximation, 4lb shrugs 2x10      Shoulder Exercises: ROM/Strengthening   UBE (Upper Arm Bike)  level 3 x 6 minutes    Lat Pull  1.5 plate;20 reps    Cybex Row  1.5 plate;20 reps      Shoulder Exercises: Isometric Strengthening   Flexion  3X5"    Extension  3X5"    External Rotation  3X5"    Internal Rotation  3X5"      Moist Heat Therapy   Number Minutes Moist Heat  15 Minutes    Moist Heat Location  Shoulder      Electrical Stimulation   Electrical Stimulation Location  left shoulder and rhomboid area    Electrical Stimulation Action  IFC    Electrical Stimulation Parameters  supine  Electrical Stimulation Goals  Pain                  PT Long Term Goals - 12/02/18 1051      PT LONG TERM GOAL #1   Title  decrease pain in the left shoulder 50%    Status  On-going      PT LONG TERM GOAL #2   Title  report able to dress and do hair without difficulty    Status  Partially Met      PT LONG TERM GOAL #3   Title  increase left shoulder AROM for flexion to 140 degrees    Status  On-going            Plan - 12/04/18 1218    Clinical Impression Statement  Pt reports ome L shoulder improvement overall. Some soreness reported with today's exercise. She reports that the soreness in normal with activity. Cues needed to perform the isometrics correctly. Tactile cues to retract scapulas with rows.    Stability/Clinical Decision Making  Evolving/Moderate complexity    Rehab Potential  Good    PT Frequency  2x / week    PT Duration  8 weeks    PT Treatment/Interventions  ADLs/Self Care Home Management;Cryotherapy;Electrical Stimulation;Moist Heat;Ultrasound;Therapeutic activities;Therapeutic exercise;Manual techniques;Patient/family education    PT Next Visit Plan   resume work on shoulder stability       Patient will benefit from skilled therapeutic intervention in order to improve the following deficits and impairments:  Pain, Improper body mechanics, Increased muscle spasms, Postural dysfunction, Impaired UE functional use, Decreased strength, Decreased range of motion  Visit Diagnosis: Stiffness of left shoulder, not elsewhere classified  Acute pain of left shoulder     Problem List Patient Active Problem List   Diagnosis Date Noted  . 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 Genetic testing 02/27/2018  . Family history of genetic disease carrier   . Family history of breast cancer   . Family history of colon cancer   . Head injury with loss of consciousness ([place]) 11/20/2017  . Nausea 11/20/2017  . 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 Tremor observed on examination 11/20/2017  . Left shoulder pain 02/18/2017  . MDD (major depressive disorder), recurrent severe, without psychosis ([place]) 12/28/2016  . Muscle strain 09/19/2016  . Right foot injury, subsequent encounter 08/25/2016  . Low back pain 06/27/2016  . Left wrist injury, subsequent encounter 06/27/2016  . Pain of left thumb 10/07/2015  . 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 Strain of left thumb 02/11/2015  . Strain of right forearm 02/11/2015  . Right shoulder pain 12/31/2014  . Injury of right little finger 12/31/2014  . Episodic cluster headache, not intractable 12/02/2014  . 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 Hypersomnia, recurrent 12/02/2014  . Migraine aura, persistent, intractable, with status migrainosus 12/02/2014  . Lower back injury 09/30/2014  . Bipolar I disorder, most recent episode depressed ([place])   . MDD (major depressive disorder), recurrent, severe, with psychosis ([place])  09/23/2014  . Injury of left index finger 08/20/2014  . Right ankle sprain 06/01/2014  . Contusion, multiple sites 06/01/2014  . Strain of right gastrocnemius muscle 06/01/2014  . Phonophobia 05/04/2014  . Photophobia of both eyes 05/04/2014  . Emotionally unstable borderline personality disorder ([place]) 05/04/2014  . Nausea with vomiting 05/04/2014  .  Mixed bipolar I disorder ([place])   . Bipolar I disorder, most recent episode mixed ([place]) 04/18/2014  . Bipolar affective disorder, depressed, mild ([place]) 04/12/2014  . Suicidal ideation 04/12/2014  . Injury of right shoulder and upper arm 02/17/2014  . Left leg pain 06/03/2013  . Right hip pain 06/03/2013  . Migraine with status migrainosus 01/08/2013  . Personality disorder ([place])   . Chronic migraine 05/08/2012  . Contact dermatitis 11/27/2011  . Major depressive disorder, recurrent episode ([place]) 10/27/2011  . Generalized anxiety disorder 10/27/2011  . 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 Borderline personality disorder ([place]) 10/27/2011  . Right foot pain 09/28/2011  . 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 Ganglion cyst 09/29/2009  . PES PLANUS 07/01/2008  . BIPOLAR DISORDER UNSPECIFIED 06/09/2008    Scot Jun, PTA 12/04/2018, 12:21 PM  [name], [place], [address] Phone: [phone]   Fax:  [phone]  Name: DECKLYN HORNIK MRN: [id number] Date of Birth: 17-Nov-1970

## 2018-12-10 ENCOUNTER — Ambulatory Visit: Payer: PPO | Admitting: Physical Therapy

## 2018-12-11 ENCOUNTER — Encounter: Payer: Self-pay | Admitting: Physical Therapy

## 2018-12-11 ENCOUNTER — Other Ambulatory Visit: Payer: Self-pay

## 2018-12-11 ENCOUNTER — Ambulatory Visit: Payer: PPO | Admitting: Physical Therapy

## 2018-12-11 DIAGNOSIS — M25512 Pain in left shoulder: Secondary | ICD-10-CM

## 2018-12-11 DIAGNOSIS — M542 Cervicalgia: Secondary | ICD-10-CM

## 2018-12-11 DIAGNOSIS — M25612 Stiffness of left shoulder, not elsewhere classified: Secondary | ICD-10-CM

## 2018-12-11 DIAGNOSIS — R252 Cramp and spasm: Secondary | ICD-10-CM

## 2018-12-11 NOTE — Therapy (Signed)
[place], [place], [address] Phone: [phone]   Fax:  [phone]  Physical Therapy Treatment  Patient Details  Name: [name] MRN: [id number] Date of Birth: [birth year] Referring Provider (PT): [place], [place]   Encounter Date: 12/11/2018  PT End of Session - 12/11/18 1147    Visit Number  12    Date for PT Re-Evaluation  12/14/18    PT Start Time  1055    PT Stop Time  1145    PT Time Calculation (min)  50 min    Activity Tolerance  Patient tolerated treatment well    Behavior During Therapy  [facility] for tasks assessed/performed;Anxious       Past Medical History:  Diagnosis Date  . Achilles tendinitis   . Achilles tendinitis   . ADHD (attention deficit hyperactivity disorder)   . Allergy   . Arthritis   . Bipolar affective ([place])   . Bipolar disorder ([place])   . Cataracts, bilateral   . Eczema   . Family history of breast cancer   . Family history of colon cancer   . Family history of genetic disease carrier   . Ganglion cyst 09/29/2009   left wrist (2 cyst)  . Hyperprolactinemia ([place])   . Hypertension   . Lipoma   . Migraine   . Personality disorder ([place])   . Pes planus     Past Surgical History:  Procedure Laterality Date  . ANKLE SURGERY  12/88   left   . chest nodule  1990?   rt chest wall nodule removal  . GANGLION CYST EXCISION  2011  . lipoma removal    . right bunioectomy    . SHOULDER SURGERY  01/13/2011   right, partial tear  . tumor resection left thigh      There were no vitals filed for this visit.  Subjective Assessment - 12/11/18 1058    Subjective  Patient reports overall she is doing a little better, still at times when it "slips"    Currently in Pain?  Yes    Pain Score  4     Pain Location  Shoulder    Pain Orientation  Left    Aggravating Factors   at times "just slips"                       [facility] Adult PT  Treatment/Exercise - 12/11/18 0001      Shoulder Exercises: Supine   Other Supine Exercises  3# punches small arc of motions, ER/IR small motions with 3#    Other Supine Exercises  3# serratus punch      Shoulder Exercises: Sidelying   External Rotation  Left;20 reps;Weights    External Rotation Weight (lbs)  2    ABduction  Left;20 reps    ABduction Limitations  worked small arc in 2 sections      Shoulder Exercises: Standing   External Rotation  Both;20 reps;Theraband    Theraband Level (Shoulder External Rotation)  Level 2 (Red)    Internal Rotation  Left;20 reps;Theraband    Theraband Level (Shoulder Internal Rotation)  Level 2 (Red)    Other Standing Exercises  ball vs wall approximation, 4lb shrugs 2x10    Other Standing Exercises  wall slides wiht cues to not go too high due to pain      Shoulder Exercises: ROM/Strengthening   UBE (Upper Arm Bike)  level 3.5  x 6 minutes    Lat Pull  1.5 plate;20 reps    Cybex Row  1.5 plate;20 reps      Shoulder Exercises: Isometric Strengthening   Extension  3X5"    External Rotation  3X5"    Internal Rotation  3X5"    ADduction  3X5"      Moist Heat Therapy   Number Minutes Moist Heat  15 Minutes    Moist Heat Location  Shoulder      Electrical Stimulation   Electrical Stimulation Location  left shoulder and rhomboid area    Electrical Stimulation Action  IFC    Electrical Stimulation Parameters  supine    Electrical Stimulation Goals  Pain                  PT Long Term Goals - 12/11/18 1150      PT LONG TERM GOAL #1   Title  decrease pain in the left shoulder 50%    Status  Partially Met      PT LONG TERM GOAL #2   Title  report able to dress and do hair without difficulty    Status  Partially Met            Plan - 12/11/18 1147    Clinical Impression Statement  Patient will return to the orthopod in early December, she would like to get strong enough to return to part time work, she will need to be  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  She is anxious and apprehensive for overhead activities and will need to be stronger and feel more stable in the shoulder    PT Next Visit Plan  resume work on shoulder stability    Consulted and Agree with Plan of Care  Patient       Patient will benefit from skilled therapeutic intervention in order to improve the following deficits and impairments:  Pain, Improper body mechanics, Increased muscle spasms, Postural dysfunction, Impaired UE functional use, Decreased strength, Decreased range of motion  Visit Diagnosis: Stiffness of left shoulder, not elsewhere classified  Acute pain of left shoulder  Cervicalgia  Cramp and spasm     Problem List Patient Active Problem List   Diagnosis Date Noted  . 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 Genetic testing 02/27/2018  . Family history of genetic disease carrier   . Family history of breast cancer   . Family history of colon cancer   . Head injury with loss of consciousness ([place]) 11/20/2017  . Nausea 11/20/2017  . 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 Tremor observed on examination 11/20/2017  . Left shoulder pain 02/18/2017  . MDD (major depressive disorder), recurrent severe, without psychosis ([place]) 12/28/2016  . Muscle strain 09/19/2016  . Right foot injury, subsequent encounter 08/25/2016  . Low back pain 06/27/2016  . Left wrist injury, subsequent encounter 06/27/2016  . Pain of left thumb 10/07/2015  . 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 Strain of left thumb 02/11/2015  . Strain of right forearm 02/11/2015  . Right shoulder pain 12/31/2014  . Injury of right little finger 12/31/2014  . Episodic cluster headache, not intractable 12/02/2014  . 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 Parasomnia overlap disorder  12/02/2014  . Hypersomnia, recurrent 12/02/2014  . Migraine aura, persistent, intractable, with status migrainosus 12/02/2014  .  Lower back injury 09/30/2014  . Bipolar I disorder, most recent episode depressed ([place])   . MDD (major depressive disorder), recurrent, severe, with psychosis ([place]) 09/23/2014  . Injury of left index finger 08/20/2014  . Right ankle sprain 06/01/2014  . Contusion, multiple sites 06/01/2014  . Strain of right gastrocnemius muscle 06/01/2014  . Phonophobia 05/04/2014  . Photophobia of both eyes 05/04/2014  . Emotionally unstable borderline personality disorder ([place]) 05/04/2014  . Nausea with vomiting 05/04/2014  . Mixed bipolar I disorder ([place])   . Bipolar I disorder, most recent episode mixed ([place]) 04/18/2014  . Bipolar affective disorder, depressed, mild ([place]) 04/12/2014  . Suicidal ideation 04/12/2014  . Injury of right shoulder and upper arm 02/17/2014  . Left leg pain 06/03/2013  . Right hip pain 06/03/2013  . Migraine with status migrainosus 01/08/2013  . Personality disorder ([place])   . Chronic migraine 05/08/2012  . Contact dermatitis 11/27/2011  . Major depressive disorder, recurrent episode ([place]) 10/27/2011  . Generalized anxiety disorder 10/27/2011  . 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 Borderline personality disorder ([place]) 10/27/2011  . Right foot pain 09/28/2011  . 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 Ganglion cyst 09/29/2009  . PES PLANUS 07/01/2008  . BIPOLAR DISORDER UNSPECIFIED 06/09/2008    Sumner Boast., PT 12/11/2018, 11:51 AM  [place] [address] [place], [place], [address] Phone: [phone]   Fax:  [phone]  Name: REENA BORROMEO MRN: [id number] Date of Birth: 07-17-1970

## 2018-12-12 ENCOUNTER — Ambulatory Visit: Payer: PPO | Admitting: Physical Therapy

## 2018-12-12 ENCOUNTER — Encounter: Payer: Self-pay | Admitting: Physical Therapy

## 2018-12-12 DIAGNOSIS — R252 Cramp and spasm: Secondary | ICD-10-CM

## 2018-12-12 DIAGNOSIS — M25512 Pain in left shoulder: Secondary | ICD-10-CM

## 2018-12-12 DIAGNOSIS — M25612 Stiffness of left shoulder, not elsewhere classified: Secondary | ICD-10-CM

## 2018-12-12 DIAGNOSIS — M542 Cervicalgia: Secondary | ICD-10-CM

## 2018-12-12 NOTE — Therapy (Signed)
[place], [place], [address] Phone: [phone]   Fax:  [phone]  Physical Therapy Treatment  Patient Details  Name: [name] MRN: [id number] Date of Birth: 12-28-1970 Referring Provider (PT): [place], [place]   Encounter Date: 12/12/2018  PT End of Session - 12/12/18 1435    Visit Number  13    Date for PT Re-Evaluation  12/14/18    PT Start Time  0240    PT Stop Time  1440    PT Time Calculation (min)  46 min    Activity Tolerance  Patient tolerated treatment well    Behavior During Therapy  [facility] for tasks assessed/performed;Anxious       Past Medical History:  Diagnosis Date  . Achilles tendinitis   . Achilles tendinitis   . ADHD (attention deficit hyperactivity disorder)   . Allergy   . Arthritis   . Bipolar affective ([place])   . Bipolar disorder ([place])   . Cataracts, bilateral   . Eczema   . Family history of breast cancer   . Family history of colon cancer   . Family history of genetic disease carrier   . Ganglion cyst 09/29/2009   left wrist (2 cyst)  . Hyperprolactinemia ([place])   . Hypertension   . Lipoma   . Migraine   . Personality disorder ([place])   . Pes planus     Past Surgical History:  Procedure Laterality Date  . ANKLE SURGERY  12/88   left   . chest nodule  1990?   rt chest wall nodule removal  . GANGLION CYST EXCISION  2011  . lipoma removal    . right bunioectomy    . SHOULDER SURGERY  01/13/2011   right, partial tear  . tumor resection left thigh      There were no vitals filed for this visit.  Subjective Assessment - 12/12/18 1355    Subjective  Reports not feeling great, really rainy day    Currently in Pain?  Yes    Pain Score  6     Pain Location  Shoulder    Pain Orientation  Left    Aggravating Factors   rainy weather                       [facility] Adult PT Treatment/Exercise - 12/12/18 0001      Shoulder  Exercises: Sidelying   External Rotation  Left;20 reps;Weights    External Rotation Weight (lbs)  2      Shoulder Exercises: Standing   External Rotation  Both;20 reps;Theraband    Theraband Level (Shoulder External Rotation)  Level 2 (Red)    Internal Rotation  Left;20 reps;Theraband    Theraband Level (Shoulder Internal Rotation)  Level 2 (Red)    Flexion  AAROM;20 reps    Flexion Limitations  wall slides to head high then circles    Extension  20 reps;Theraband    Theraband Level (Shoulder Extension)  Level 2 (Red)    Row  20 reps;Theraband    Theraband Level (Shoulder Row)  Level 3 (Green)    Other Standing Exercises  ball vs wall approximation, 4lb shrugs 2x10      Shoulder Exercises: ROM/Strengthening   UBE (Upper Arm Bike)  level 3.5 x 6 minutes    Lat Pull  2 plate;20 reps    Cybex Row  2 plate;20 reps    Other ROM/Strengthening Exercises  1# and 2# cabinet reaching to shoulder height and head high      Shoulder Exercises: Isometric Strengthening   External Rotation  3X5"    Internal Rotation  3X5"      Moist Heat Therapy   Number Minutes Moist Heat  15 Minutes    Moist Heat Location  Shoulder      Electrical Stimulation   Electrical Stimulation Location  left shoulder and rhomboid area    Electrical Stimulation Action  IFC    Electrical Stimulation Parameters  supine    Electrical Stimulation Goals  Pain                  PT Long Term Goals - 12/12/18 1609      PT LONG TERM GOAL #1   Title  decrease pain in the left shoulder 50%    Status  Partially Met            Plan - 12/12/18 1607    Clinical Impression Statement  Tried to start adding some of the functional reaching to see if we could prepare her for a return to work, she was tender with this and tended to abduct the arm some, this seemed to put more pressure on the shoulder.  Tolerated other exercises without issue, some cues to decresae shoulder elvation    PT Next Visit Plan  continue  to progress as tolerated    Consulted and Agree with Plan of Care  Patient       Patient will benefit from skilled therapeutic intervention in order to improve the following deficits and impairments:  Pain, Improper body mechanics, Increased muscle spasms, Postural dysfunction, Impaired UE functional use, Decreased strength, Decreased range of motion  Visit Diagnosis: Stiffness of left shoulder, not elsewhere classified  Acute pain of left shoulder  Cervicalgia  Cramp and spasm     Problem List Patient Active Problem List   Diagnosis Date Noted  . 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 Genetic testing 02/27/2018  . Family history of genetic disease carrier   . Family history of breast cancer   . Family history of colon cancer   . Head injury with loss of consciousness ([place]) 11/20/2017  . Nausea 11/20/2017  . 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 Tremor observed on examination 11/20/2017  . Left shoulder pain 02/18/2017  . MDD (major depressive disorder), recurrent severe, without psychosis ([place]) 12/28/2016  . Muscle strain 09/19/2016  . Right foot injury, subsequent encounter 08/25/2016  . Low back pain 06/27/2016  . Left wrist injury, subsequent encounter 06/27/2016  . Pain of left thumb 10/07/2015  . 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 Strain of left thumb 02/11/2015  . Strain of right forearm 02/11/2015  . Right shoulder pain 12/31/2014  . Injury of right little finger 12/31/2014  . Episodic cluster headache, not intractable 12/02/2014  . 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 Hypersomnia, recurrent 12/02/2014  . Migraine aura, persistent, intractable, with status migrainosus 12/02/2014  . Lower back injury 09/30/2014  . Bipolar I disorder, most recent episode depressed ([place])    . MDD (major depressive disorder), recurrent, severe, with psychosis ([place]) 09/23/2014  . Injury of left index finger 08/20/2014  . Right ankle sprain 06/01/2014  . Contusion, multiple sites 06/01/2014  . Strain of right gastrocnemius muscle 06/01/2014  . Phonophobia 05/04/2014  .  Photophobia of both eyes 05/04/2014  . Emotionally unstable borderline personality disorder ([place]) 05/04/2014  . Nausea with vomiting 05/04/2014  . Mixed bipolar I disorder ([place])   . Bipolar I disorder, most recent episode mixed ([place]) 04/18/2014  . Bipolar affective disorder, depressed, mild ([place]) 04/12/2014  . Suicidal ideation 04/12/2014  . Injury of right shoulder and upper arm 02/17/2014  . Left leg pain 06/03/2013  . Right hip pain 06/03/2013  . Migraine with status migrainosus 01/08/2013  . Personality disorder ([place])   . Chronic migraine 05/08/2012  . Contact dermatitis 11/27/2011  . Major depressive disorder, recurrent episode ([place]) 10/27/2011  . Generalized anxiety disorder 10/27/2011  . 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 Borderline personality disorder ([place]) 10/27/2011  . Right foot pain 09/28/2011  . 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 Ganglion cyst 09/29/2009  . PES PLANUS 07/01/2008  . BIPOLAR DISORDER UNSPECIFIED 06/09/2008    Sumner Boast., PT 12/12/2018, 4:09 PM  [place] [address] [place] [place], [place], [address] Phone: [phone]   Fax:  [phone]  Name: [name] MRN: [id number] Date of Birth: Apr 25, 1970

## 2018-12-16 ENCOUNTER — Ambulatory Visit: Payer: PPO | Admitting: Physical Therapy

## 2018-12-16 ENCOUNTER — Other Ambulatory Visit: Payer: Self-pay

## 2018-12-16 ENCOUNTER — Encounter: Payer: Self-pay | Admitting: Physical Therapy

## 2018-12-16 DIAGNOSIS — M542 Cervicalgia: Secondary | ICD-10-CM

## 2018-12-16 DIAGNOSIS — M25512 Pain in left shoulder: Secondary | ICD-10-CM | POA: Diagnosis not present

## 2018-12-16 DIAGNOSIS — M25612 Stiffness of left shoulder, not elsewhere classified: Secondary | ICD-10-CM

## 2018-12-16 NOTE — Therapy (Signed)
[place], [place], [address] Phone: [phone]   Fax:  [phone]  Physical Therapy Treatment  Patient Details  Name: [name] MRN: [id number] Date of Birth: [birth year] Referring Provider (PT): [place], [place]   Encounter Date: 12/16/2018  PT End of Session - 12/16/18 1515    Visit Number  14    Date for PT Re-Evaluation  01/15/19    PT Start Time  3419    PT Stop Time  1528    PT Time Calculation (min)  49 min    Activity Tolerance  Patient tolerated treatment well    Behavior During Therapy  [facility] for tasks assessed/performed;Anxious       Past Medical History:  Diagnosis Date  . Achilles tendinitis   . Achilles tendinitis   . ADHD (attention deficit hyperactivity disorder)   . Allergy   . Arthritis   . Bipolar affective ([place])   . Bipolar disorder ([place])   . Cataracts, bilateral   . Eczema   . Family history of breast cancer   . Family history of colon cancer   . Family history of genetic disease carrier   . Ganglion cyst 09/29/2009   left wrist (2 cyst)  . Hyperprolactinemia ([place])   . Hypertension   . Lipoma   . Migraine   . Personality disorder ([place])   . Pes planus     Past Surgical History:  Procedure Laterality Date  . ANKLE SURGERY  12/88   left   . chest nodule  1990?   rt chest wall nodule removal  . GANGLION CYST EXCISION  2011  . lipoma removal    . right bunioectomy    . SHOULDER SURGERY  01/13/2011   right, partial tear  . tumor resection left thigh      There were no vitals filed for this visit.  Subjective Assessment - 12/16/18 1443    Subjective  Felt a little more unstable over the weekend, reports did not do anything different    Currently in Pain?  Yes    Pain Score  6     Pain Location  Shoulder    Pain Orientation  Left                       [facility] Adult PT Treatment/Exercise - 12/16/18 0001      Therapeutic  Activites    Therapeutic Activities  Work Simulation    Work Simulation  had her fuold towels and then put them in cabinet at shoulder and head high      Shoulder Exercises: Seated   Extension  Left;20 reps    Extension Weight (lbs)  2    Extension Limitations  bent over extension    Row  Left;20 reps;Weights    Row Weight (lbs)  3    Row Limitations  bent over row      Shoulder Exercises: Standing   External Rotation  Both;20 reps;Theraband    Theraband Level (Shoulder External Rotation)  Level 2 (Red)    Internal Rotation  Left;20 reps;Theraband    Theraband Level (Shoulder Internal Rotation)  Level 3 (Green)    Extension  20 reps;Theraband    Theraband Level (Shoulder Extension)  Level 3 (Green)    Row  20 reps;Theraband    Theraband Level (Shoulder Row)  Level 3 (Green)    Other Standing Exercises  ball vs wall approximation, 4lb shrugs  2x10    Other Standing Exercises  4# ball chest to head high touching wall      Shoulder Exercises: ROM/Strengthening   UBE (Upper Arm Bike)  level 3.5 x 6 minutes    Lat Pull  2 plate;20 reps    Cybex Row  2 plate;20 reps      Shoulder Exercises: Isometric Strengthening   Extension  3X5"    ADduction  3X5"      Moist Heat Therapy   Number Minutes Moist Heat  15 Minutes    Moist Heat Location  Shoulder      Electrical Stimulation   Electrical Stimulation Location  left shoulder and rhomboid area    Electrical Stimulation Action  IFC    Electrical Stimulation Parameters  supine    Electrical Stimulation Goals  Pain                  PT Long Term Goals - 12/16/18 1518      PT LONG TERM GOAL #1   Title  decrease pain in the left shoulder 50%    Status  Partially Met      PT LONG TERM GOAL #2   Title  report able to dress and do hair without difficulty    Status  Partially Met            Plan - 12/16/18 1516    Clinical Impression Statement  Worked on work simulation, she did okay but fatigued easily and had to  have some gaurding as she felt like the shoulder was not stable.  She continues to need cues to retract scapulae before reaching to help with stability, she also does soime rotation at the shoulder when reaching.    PT Next Visit Plan  work on her work Programmer, multimedia with Plan of Care  Patient       Patient will benefit from skilled therapeutic intervention in order to improve the following deficits and impairments:  Pain, Improper body mechanics, Increased muscle spasms, Postural dysfunction, Impaired UE functional use, Decreased strength, Decreased range of motion  Visit Diagnosis: Stiffness of left shoulder, not elsewhere classified  Acute pain of left shoulder  Cervicalgia     Problem List Patient Active Problem List   Diagnosis Date Noted  . 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 Genetic testing 02/27/2018  . Family history of genetic disease carrier   . Family history of breast cancer   . Family history of colon cancer   . Head injury with loss of consciousness ([place]) 11/20/2017  . Nausea 11/20/2017  . 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 Tremor observed on examination 11/20/2017  . Left shoulder pain 02/18/2017  . MDD (major depressive disorder), recurrent severe, without psychosis ([name]) 12/28/2016  . Muscle strain 09/19/2016  . Right foot injury, subsequent encounter 08/25/2016  . Low back pain 06/27/2016  . Left wrist injury, subsequent encounter 06/27/2016  . Pain of left thumb 10/07/2015  . 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 Strain of left thumb 02/11/2015  . Strain of right forearm 02/11/2015  . Right shoulder pain 12/31/2014  . Injury of right little finger 12/31/2014  . Episodic cluster headache, not intractable 12/02/2014  . 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 Parasomnia overlap disorder  12/02/2014  . Hypersomnia, recurrent 12/02/2014  . Migraine aura, persistent, intractable, with status  migrainosus 12/02/2014  . Lower back injury 09/30/2014  . Bipolar I disorder, most recent episode depressed ([place])   . MDD (major depressive disorder), recurrent, severe, with psychosis ([place]) 09/23/2014  . Injury of left index finger 08/20/2014  . Right ankle sprain 06/01/2014  . Contusion, multiple sites 06/01/2014  . Strain of right gastrocnemius muscle 06/01/2014  . Phonophobia 05/04/2014  . Photophobia of both eyes 05/04/2014  . Emotionally unstable borderline personality disorder ([place]) 05/04/2014  . Nausea with vomiting 05/04/2014  . Mixed bipolar I disorder ([place])   . Bipolar I disorder, most recent episode mixed ([place]) 04/18/2014  . Bipolar affective disorder, depressed, mild ([place]) 04/12/2014  . Suicidal ideation 04/12/2014  . Injury of right shoulder and upper arm 02/17/2014  . Left leg pain 06/03/2013  . Right hip pain 06/03/2013  . Migraine with status migrainosus 01/08/2013  . Personality disorder ([place])   . Chronic migraine 05/08/2012  . Contact dermatitis 11/27/2011  . Major depressive disorder, recurrent episode ([place]) 10/27/2011  . Generalized anxiety disorder 10/27/2011  . 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 Borderline personality disorder ([place]) 10/27/2011  . Right foot pain 09/28/2011  . 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 Ganglion cyst 09/29/2009  . PES PLANUS 07/01/2008  . BIPOLAR DISORDER UNSPECIFIED 06/09/2008    Sumner Boast., PT 12/16/2018, 3:19 PM  [address] [place], [place], [address] Phone: [phone]   Fax:  [phone]  Name: [name] MRN: [id number] Date of Birth: 06/08/1970

## 2018-12-19 ENCOUNTER — Encounter: Payer: Self-pay | Admitting: Physical Therapy

## 2018-12-19 ENCOUNTER — Other Ambulatory Visit: Payer: Self-pay

## 2018-12-19 ENCOUNTER — Ambulatory Visit: Payer: PPO | Admitting: Physical Therapy

## 2018-12-19 DIAGNOSIS — M25612 Stiffness of left shoulder, not elsewhere classified: Secondary | ICD-10-CM

## 2018-12-19 DIAGNOSIS — M25512 Pain in left shoulder: Secondary | ICD-10-CM

## 2018-12-19 NOTE — Therapy (Signed)
[place], [place], [address] Phone: [phone]   Fax:  [phone]  Physical Therapy Treatment  Patient Details  Name: [name] MRN: [id number] Date of Birth: 28-Mar-1970 Referring Provider (PT): [place], [place]   Encounter Date: 12/19/2018  PT End of Session - 12/19/18 1423    Visit Number  15    Date for PT Re-Evaluation  01/15/19    PT Start Time  8299    PT Stop Time  1440    PT Time Calculation (min)  55 min    Activity Tolerance  Patient tolerated treatment well    Behavior During Therapy  [facility] for tasks assessed/performed;Anxious       Past Medical History:  Diagnosis Date  . Achilles tendinitis   . Achilles tendinitis   . ADHD (attention deficit hyperactivity disorder)   . Allergy   . Arthritis   . Bipolar affective ([place])   . Bipolar disorder ([place])   . Cataracts, bilateral   . Eczema   . Family history of breast cancer   . Family history of colon cancer   . Family history of genetic disease carrier   . Ganglion cyst 09/29/2009   left wrist (2 cyst)  . Hyperprolactinemia ([place])   . Hypertension   . Lipoma   . Migraine   . Personality disorder ([place])   . Pes planus     Past Surgical History:  Procedure Laterality Date  . ANKLE SURGERY  12/88   left   . chest nodule  1990?   rt chest wall nodule removal  . GANGLION CYST EXCISION  2011  . lipoma removal    . right bunioectomy    . SHOULDER SURGERY  01/13/2011   right, partial tear  . tumor resection left thigh      There were no vitals filed for this visit.  Subjective Assessment - 12/19/18 1344    Subjective  "not too bad today"    Patient Stated Goals  have a stable shoulder, less pain, stronger    Currently in Pain?  Yes    Pain Score  2     Pain Location  Shoulder    Pain Orientation  Left                       [facility] Adult PT Treatment/Exercise - 12/19/18 0001       Therapeutic Activites    Therapeutic Activities  Work Simulation    Work Simulation  had her fuold towels and then put them in cabinet at shoulder and head high      Shoulder Exercises: Seated   Extension  Left;20 reps    Extension Limitations  bent over extension    Row  Left;20 reps;Weights    Row Weight (lbs)  2    Row Limitations  bent over row      Shoulder Exercises: Standing   External Rotation  Both;20 reps;Theraband    Theraband Level (Shoulder External Rotation)  Level 1 (Yellow)    Internal Rotation  Left;20 reps;Theraband    Theraband Level (Shoulder Internal Rotation)  Level 3 (Green)    Extension  20 reps;Theraband    Theraband Level (Shoulder Extension)  Level 3 (Green)    Row  20 reps;Theraband    Theraband Level (Shoulder Row)  Level 3 (Green)    Other Standing Exercises  ball vs wall approximation, 4lb shrugs 2x10  Shoulder Exercises: ROM/Strengthening   UBE (Upper Arm Bike)  level 4 x 6 minutes    Lat Pull  2 plate;20 reps    Cybex Row  2 plate;20 reps      Moist Heat Therapy   Number Minutes Moist Heat  15 Minutes    Moist Heat Location  Shoulder      Electrical Stimulation   Electrical Stimulation Location  left shoulder and rhomboid area    Electrical Stimulation Action  IFC    Electrical Stimulation Parameters  supine    Electrical Stimulation Goals  Pain                  PT Long Term Goals - 12/16/18 1518      PT LONG TERM GOAL #1   Title  decrease pain in the left shoulder 50%    Status  Partially Met      PT LONG TERM GOAL #2   Title  report able to dress and do hair without difficulty    Status  Partially Met            Plan - 12/19/18 1424    Clinical Impression Statement  Pt did well with work simulation activity and had no issues after last treatment. She did reports some discomfort with ball Vs wall in the twelve o'clock position. Tactile cues for arm placement with resisted ER/IR.    Stability/Clinical Decision  Making  Evolving/Moderate complexity    Rehab Potential  Good    PT Frequency  2x / week    PT Treatment/Interventions  ADLs/Self Care Home Management;Cryotherapy;Electrical Stimulation;Moist Heat;Ultrasound;Therapeutic activities;Therapeutic exercise;Manual techniques;Patient/family education    PT Next Visit Plan  work on her work simulation       Patient will benefit from skilled therapeutic intervention in order to improve the following deficits and impairments:  Pain, Improper body mechanics, Increased muscle spasms, Postural dysfunction, Impaired UE functional use, Decreased strength, Decreased range of motion  Visit Diagnosis: Acute pain of left shoulder  Stiffness of left shoulder, not elsewhere classified     Problem List Patient Active Problem List   Diagnosis Date Noted  . 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 Genetic testing 02/27/2018  . Family history of genetic disease carrier   . Family history of breast cancer   . Family history of colon cancer   . Head injury with loss of consciousness ([place]) 11/20/2017  . Nausea 11/20/2017  . 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 Tremor observed on examination 11/20/2017  . Left shoulder pain 02/18/2017  . MDD (major depressive disorder), recurrent severe, without psychosis ([place]) 12/28/2016  . Muscle strain 09/19/2016  . Right foot injury, subsequent encounter 08/25/2016  . Low back pain 06/27/2016  . Left wrist injury, subsequent encounter 06/27/2016  . Pain of left thumb 10/07/2015  . 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 Strain of left thumb 02/11/2015  . Strain of right forearm 02/11/2015  . Right shoulder pain 12/31/2014  . Injury of right little finger 12/31/2014  . Episodic cluster headache, not intractable 12/02/2014  . 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 Parasomnia  overlap disorder 12/02/2014  . Hypersomnia, recurrent 12/02/2014  . Migraine aura, persistent, intractable, with status migrainosus 12/02/2014  . Lower back injury 09/30/2014  . Bipolar I disorder, most recent episode depressed ([place])   . MDD (major depressive disorder), recurrent, severe, with  psychosis ([place]) 09/23/2014  . Injury of left index finger 08/20/2014  . Right ankle sprain 06/01/2014  . Contusion, multiple sites 06/01/2014  . Strain of right gastrocnemius muscle 06/01/2014  . Phonophobia 05/04/2014  . Photophobia of both eyes 05/04/2014  . Emotionally unstable borderline personality disorder ([place]) 05/04/2014  . Nausea with vomiting 05/04/2014  . Mixed bipolar I disorder ([place])   . Bipolar I disorder, most recent episode mixed ([place]) 04/18/2014  . Bipolar affective disorder, depressed, mild ([place]) 04/12/2014  . Suicidal ideation 04/12/2014  . Injury of right shoulder and upper arm 02/17/2014  . Left leg pain 06/03/2013  . Right hip pain 06/03/2013  . Migraine with status migrainosus 01/08/2013  . Personality disorder ([place])   . Chronic migraine 05/08/2012  . Contact dermatitis 11/27/2011  . Major depressive disorder, recurrent episode ([place]) 10/27/2011  . Generalized anxiety disorder 10/27/2011  . 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 Borderline personality disorder ([place]) 10/27/2011  . Right foot pain 09/28/2011  . 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 Ganglion cyst 09/29/2009  . PES PLANUS 07/01/2008  . BIPOLAR DISORDER UNSPECIFIED 06/09/2008    Scot Jun, PTA 12/19/2018, 2:26 PM  [place] [address] [place], [place], [address] Phone: [phone]   Fax:  [phone]  Name: [name] MRN: [id number] Date of Birth: 04-28-1970

## 2018-12-23 ENCOUNTER — Encounter: Payer: Self-pay | Admitting: Physical Therapy

## 2018-12-23 ENCOUNTER — Other Ambulatory Visit: Payer: Self-pay

## 2018-12-23 ENCOUNTER — Ambulatory Visit: Payer: PPO | Admitting: Physical Therapy

## 2018-12-23 DIAGNOSIS — R252 Cramp and spasm: Secondary | ICD-10-CM

## 2018-12-23 DIAGNOSIS — M25512 Pain in left shoulder: Secondary | ICD-10-CM | POA: Diagnosis not present

## 2018-12-23 DIAGNOSIS — M542 Cervicalgia: Secondary | ICD-10-CM

## 2018-12-23 DIAGNOSIS — M25612 Stiffness of left shoulder, not elsewhere classified: Secondary | ICD-10-CM

## 2018-12-23 NOTE — Therapy (Signed)
[place], [place], [address] Phone: [phone]   Fax:  [phone]  Physical Therapy Treatment  Patient Details  Name: [name] MRN: [id number] Date of Birth: [birth year] Referring Provider (PT): [place], [place]   Encounter Date: 12/23/2018  PT End of Session - 12/23/18 1356    Visit Number  16    Date for PT Re-Evaluation  01/15/19    PT Start Time  1315    PT Stop Time  1412    PT Time Calculation (min)  57 min    Activity Tolerance  Patient tolerated treatment well    Behavior During Therapy  [facility] for tasks assessed/performed;Anxious       Past Medical History:  Diagnosis Date  . Achilles tendinitis   . Achilles tendinitis   . ADHD (attention deficit hyperactivity disorder)   . Allergy   . Arthritis   . Bipolar affective ([place])   . Bipolar disorder ([place])   . Cataracts, bilateral   . Eczema   . Family history of breast cancer   . Family history of colon cancer   . Family history of genetic disease carrier   . Ganglion cyst 09/29/2009   left wrist (2 cyst)  . Hyperprolactinemia ([place])   . Hypertension   . Lipoma   . Migraine   . Personality disorder ([place])   . Pes planus     Past Surgical History:  Procedure Laterality Date  . ANKLE SURGERY  12/88   left   . chest nodule  1990?   rt chest wall nodule removal  . GANGLION CYST EXCISION  2011  . lipoma removal    . right bunioectomy    . SHOULDER SURGERY  01/13/2011   right, partial tear  . tumor resection left thigh      There were no vitals filed for this visit.  Subjective Assessment - 12/23/18 1320    Subjective  Patient reports that she has been doing well but reports making the u-turn to get to our driveway she felt pain,in the left shoulder anterior    Currently in Pain?  Yes    Pain Score  5     Pain Location  Shoulder    Pain Orientation  Left;Anterior    Pain Descriptors / Indicators  Sharp     Aggravating Factors   making a u-turn                       [facility] Adult PT Treatment/Exercise - 12/23/18 0001      Therapeutic Activites    Therapeutic Activities  Work Simulation    Work Simulation  had her fold towels and then put them in cabinet at shoulder and head high, then taking a 3# weight from in front of her pulling to her then putting out to side and then back 3 points      Shoulder Exercises: Seated   Extension  Left;20 reps    Extension Weight (lbs)  2    Extension Limitations  bent over extension    Row  Left;20 reps;Weights    Row Weight (lbs)  2    Row Limitations  bent over row      Shoulder Exercises: Sidelying   External Rotation  Left;20 reps;Weights    External Rotation Weight (lbs)  3    ABduction  Left;20 reps    ABduction Limitations  worked small arc in 2 sections  Shoulder Exercises: ROM/Strengthening   UBE (Upper Arm Bike)  level 4 x 6 minutes    Lat Pull  2 plate;20 reps    Cybex Press  1 plate;15 reps    Cybex Row  2 plate;20 reps    Wall Wash  with 2# weight circles at head high, overhead side to side and small arc flexion    "W" Arms  10    Other ROM/Strengthening Exercises  1# and 2# cabinet reaching to shoulder height and head high      Moist Heat Therapy   Number Minutes Moist Heat  15 Minutes    Moist Heat Location  Shoulder      Electrical Stimulation   Electrical Stimulation Location  left shoulder and rhomboid area    Electrical Stimulation Action  IFC    Electrical Stimulation Parameters  supine    Electrical Stimulation Goals  Pain                  PT Long Term Goals - 12/23/18 1358      PT LONG TERM GOAL #1   Title  decrease pain in the left shoulder 50%    Status  Partially Met      PT LONG TERM GOAL #2   Title  report able to dress and do hair without difficulty    Status  Partially Met            Plan - 12/23/18 1356    Clinical Impression Statement  Patient continues to have a  lot of compensation where she elevates the shoulder, has a lot of tightness in the upper trap.  She seemed to do better with less pain today, she is still very painful and anxiuos with the work simulation    PT Next Visit Plan  work on her work Programmer, multimedia with Plan of Care  Patient       Patient will benefit from skilled therapeutic intervention in order to improve the following deficits and impairments:  Pain, Improper body mechanics, Increased muscle spasms, Postural dysfunction, Impaired UE functional use, Decreased strength, Decreased range of motion  Visit Diagnosis: Acute pain of left shoulder  Stiffness of left shoulder, not elsewhere classified  Cervicalgia  Cramp and spasm     Problem List Patient Active Problem List   Diagnosis Date Noted  . 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 Genetic testing 02/27/2018  . Family history of genetic disease carrier   . Family history of breast cancer   . Family history of colon cancer   . Head injury with loss of consciousness ([place]) 11/20/2017  . Nausea 11/20/2017  . 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 Tremor observed on examination 11/20/2017  . Left shoulder pain 02/18/2017  . MDD (major depressive disorder), recurrent severe, without psychosis ([place]) 12/28/2016  . Muscle strain 09/19/2016  . Right foot injury, subsequent encounter 08/25/2016  . Low back pain 06/27/2016  . Left wrist injury, subsequent encounter 06/27/2016  . Pain of left thumb 10/07/2015  . 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 Strain of left thumb 02/11/2015  . Strain of right forearm 02/11/2015  . Right shoulder pain 12/31/2014  . Injury of right little finger 12/31/2014  . Episodic cluster headache, not intractable 12/02/2014  . 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 Parasomnia overlap  disorder 12/02/2014  .  Hypersomnia, recurrent 12/02/2014  . Migraine aura, persistent, intractable, with status migrainosus 12/02/2014  . Lower back injury 09/30/2014  . Bipolar I disorder, most recent episode depressed ([place])   . MDD (major depressive disorder), recurrent, severe, with psychosis ([place]) 09/23/2014  . Injury of left index finger 08/20/2014  . Right ankle sprain 06/01/2014  . Contusion, multiple sites 06/01/2014  . Strain of right gastrocnemius muscle 06/01/2014  . Phonophobia 05/04/2014  . Photophobia of both eyes 05/04/2014  . Emotionally unstable borderline personality disorder ([place]) 05/04/2014  . Nausea with vomiting 05/04/2014  . Mixed bipolar I disorder ([place])   . Bipolar I disorder, most recent episode mixed ([place]) 04/18/2014  . Bipolar affective disorder, depressed, mild ([place]) 04/12/2014  . Suicidal ideation 04/12/2014  . Injury of right shoulder and upper arm 02/17/2014  . Left leg pain 06/03/2013  . Right hip pain 06/03/2013  . Migraine with status migrainosus 01/08/2013  . Personality disorder ([place])   . Chronic migraine 05/08/2012  . Contact dermatitis 11/27/2011  . Major depressive disorder, recurrent episode ([place]) 10/27/2011  . Generalized anxiety disorder 10/27/2011  . 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 Borderline personality disorder ([place]) 10/27/2011  . Right foot pain 09/28/2011  . 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) 09/02/2010  . Ganglion cyst 09/29/2009  . PES PLANUS 07/01/2008  . BIPOLAR DISORDER UNSPECIFIED 06/09/2008    Sumner Boast., PT 12/23/2018, 1:59 PM  [address] [place], [place], [address] Phone: [phone]   Fax:  [phone]  Name: [name] MRN: [id number] Date of Birth: July 23, 1970

## 2018-12-25 ENCOUNTER — Ambulatory Visit: Payer: PPO | Admitting: Physical Therapy

## 2018-12-25 ENCOUNTER — Encounter: Payer: Self-pay | Admitting: Physical Therapy

## 2018-12-25 ENCOUNTER — Other Ambulatory Visit: Payer: Self-pay

## 2018-12-25 DIAGNOSIS — M25512 Pain in left shoulder: Secondary | ICD-10-CM | POA: Diagnosis not present

## 2018-12-25 DIAGNOSIS — M542 Cervicalgia: Secondary | ICD-10-CM

## 2018-12-25 DIAGNOSIS — M25612 Stiffness of left shoulder, not elsewhere classified: Secondary | ICD-10-CM
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
## 2018-12-26 ENCOUNTER — Other Ambulatory Visit: Payer: Self-pay | Admitting: Adult Health

## 2018-12-30 ENCOUNTER — Ambulatory Visit: Payer: PPO | Admitting: Physical Therapy

## 2018-12-30 ENCOUNTER — Encounter: Payer: Self-pay | Admitting: Physical Therapy

## 2018-12-30 ENCOUNTER — Other Ambulatory Visit: Payer: Self-pay

## 2018-12-30 DIAGNOSIS — S86812A Strain of other muscle(s) and tendon(s) at lower leg level, left leg, initial encounter: Secondary | ICD-10-CM | POA: Diagnosis not present

## 2018-12-30 DIAGNOSIS — M25512 Pain in left shoulder: Secondary | ICD-10-CM | POA: Diagnosis not present

## 2018-12-30 DIAGNOSIS — M25612 Stiffness of left shoulder, not elsewhere classified: Secondary | ICD-10-CM

## 2018-12-30 NOTE — Therapy (Signed)
[facility], [place], [address] Phone: [phone]   Fax:  [phone]  Physical Therapy Treatment  Patient Details  Name: [name] MRN: [id number] Date of Birth: 1970-12-05 Referring Provider (PT): [place], [place]   Encounter Date: 12/30/2018  PT End of Session - 12/30/18 1219    Visit Number  18    Date for PT Re-Evaluation  01/15/19    PT Start Time  6712    PT Stop Time  4580    PT Time Calculation (min)  54 min    Activity Tolerance  Patient tolerated treatment well    Behavior During Therapy  [facility] for tasks assessed/performed;Anxious       Past Medical History:  Diagnosis Date  . Achilles tendinitis   . Achilles tendinitis   . ADHD (attention deficit hyperactivity disorder)   . Allergy   . Arthritis   . Bipolar affective ([place])   . Bipolar disorder ([place])   . Cataracts, bilateral   . Eczema   . Family history of breast cancer   . Family history of colon cancer   . Family history of genetic disease carrier   . Ganglion cyst 09/29/2009   left wrist (2 cyst)  . Hyperprolactinemia ([place])   . Hypertension   . Lipoma   . Migraine   . Personality disorder ([place])   . Pes planus     Past Surgical History:  Procedure Laterality Date  . ANKLE SURGERY  12/88   left   . chest nodule  1990?   rt chest wall nodule removal  . GANGLION CYST EXCISION  2011  . lipoma removal    . right bunioectomy    . SHOULDER SURGERY  01/13/2011   right, partial tear  . tumor resection left thigh      There were no vitals filed for this visit.  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  Yes    Pain Score  3     Pain Location  Shoulder    Pain Orientation  Left                       [facility] Adult PT Treatment/Exercise - 12/30/18 0001      Therapeutic  Activites    Therapeutic Activities  Work Simulation   1lb cuff arouncd wrist.   Work Simulation  had her fold towels and then put them in cabinet at shoulder and head high, then taking a 3# weight from in front of her pulling to her then putting out to side and then back 3 points      Shoulder Exercises: Standing   Horizontal ABduction  Strengthening;Both;20 reps;Theraband    Theraband Level (Shoulder Horizontal ABduction)  Level 1 (Yellow)    External Rotation  20 reps;Theraband;Left    Theraband Level (Shoulder External Rotation)  Level 1 (Yellow)    Internal Rotation  Left;20 reps;Theraband    Theraband Level (Shoulder Internal Rotation)  Level 1 (Yellow)    Extension  20 reps;Theraband;Both;Strengthening    Extension Weight (lbs)  2    Other Standing Exercises  rev grip rows 10lb 2x10    Other Standing Exercises  triceps ext 20lb 2x10      Shoulder Exercises: ROM/Strengthening   UBE (Upper Arm Bike)  level 4  x 6 minutes    Other ROM/Strengthening Exercises  1# and 2# cabinet reaching to shoulder height and head high      Moist Heat Therapy   Number Minutes Moist Heat  15 Minutes    Moist Heat Location  Shoulder      Electrical Stimulation   Electrical Stimulation Location  left shoulder and rhomboid area    Electrical Stimulation Action  IFC    Electrical Stimulation Parameters  supine    Electrical Stimulation Goals  Pain                  PT Long Term Goals - 12/30/18 1224      PT LONG TERM GOAL #1   Title  decrease pain in the left shoulder 50%    Status  Partially Met      PT LONG TERM GOAL #2   Title  report able to dress and do hair without difficulty    Status  Partially Met      PT LONG TERM GOAL #3   Title  increase left shoulder AROM for flexion to 140 degrees    Status  Partially Met      PT LONG TERM GOAL #4   Title  increase left shoulder IR to 65 degrees            Plan - 12/30/18 1221    Clinical Impression Statement  Pt was able  to complete all of today's interventions well. Cues to squeeze scapulas together with horizontal abduction. Shoulder fatigue reported with cabinet reaches, but she was able to go to the overhead shelf. Attempted 20lb with reverse grip rows but this was too too and a lower weight was selected. No reports of increase pain, tactile cues needed to keep L elbow to side with external rotation.    Stability/Clinical Decision Making  Evolving/Moderate complexity    Rehab Potential  Good    PT Frequency  2x / week    PT Duration  8 weeks    PT Next Visit Plan  work on her work simulation       Patient will benefit from skilled therapeutic intervention in order to improve the following deficits and impairments:  Pain, Improper body mechanics, Increased muscle spasms, Postural dysfunction, Impaired UE functional use, Decreased strength, Decreased range of motion  Visit Diagnosis: Acute pain of left shoulder  Stiffness of left shoulder, not elsewhere classified     Problem List Patient Active Problem List   Diagnosis Date Noted  . 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 Genetic testing 02/27/2018  . Family history of genetic disease carrier   . Family history of breast cancer   . Family history of colon cancer   . Head injury with loss of consciousness ([place]) 11/20/2017  . Nausea 11/20/2017  . 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 Tremor observed on examination 11/20/2017  . Left shoulder pain 02/18/2017  . MDD (major depressive disorder), recurrent severe, without psychosis ([place]) 12/28/2016  . Muscle strain 09/19/2016  . Right foot injury, subsequent encounter 08/25/2016  . Low back pain 06/27/2016  . Left wrist injury, subsequent encounter 06/27/2016  . Pain of left thumb 10/07/2015  . 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 Strain of left thumb 02/11/2015  . Strain  of right forearm 02/11/2015  . Right shoulder pain 12/31/2014  . Injury of right little finger 12/31/2014  .  Episodic cluster headache, not intractable 12/02/2014  . 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 Hypersomnia, recurrent 12/02/2014  . Migraine aura, persistent, intractable, with status migrainosus 12/02/2014  . Lower back injury 09/30/2014  . Bipolar I disorder, most recent episode depressed ([place])   . MDD (major depressive disorder), recurrent, severe, with psychosis ([place]) 09/23/2014  . Injury of left index finger 08/20/2014  . Right ankle sprain 06/01/2014  . Contusion, multiple sites 06/01/2014  . Strain of right gastrocnemius muscle 06/01/2014  . Phonophobia 05/04/2014  . Photophobia of both eyes 05/04/2014  . Emotionally unstable borderline personality disorder ([place]) 05/04/2014  . Nausea with vomiting 05/04/2014  . Mixed bipolar I disorder ([place])   . Bipolar I disorder, most recent episode mixed ([place]) 04/18/2014  . Bipolar affective disorder, depressed, mild ([place]) 04/12/2014  . Suicidal ideation 04/12/2014  . Injury of right shoulder and upper arm 02/17/2014  . Left leg pain 06/03/2013  . Right hip pain 06/03/2013  . Migraine with status migrainosus 01/08/2013  . Personality disorder ([place])   . Chronic migraine 05/08/2012  . Contact dermatitis 11/27/2011  . Major depressive disorder, recurrent episode ([place]) 10/27/2011  . Generalized anxiety disorder 10/27/2011  . 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 Borderline personality disorder ([place]) 10/27/2011  . Right foot pain 09/28/2011  . 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 Ganglion cyst 09/29/2009  . PES PLANUS 07/01/2008  . BIPOLAR DISORDER UNSPECIFIED 06/09/2008    Scot Jun, PTA 12/30/2018, 12:24 PM  [address] [place], [place], [address] Phone: [phone]   Fax:  [phone]  Name: NORIKO MACARI MRN: [id number] Date of Birth: 29-Aug-1970

## 2018-12-31 ENCOUNTER — Telehealth: Payer: Self-pay | Admitting: Adult Health

## 2018-12-31 NOTE — Telephone Encounter (Signed)
Patient has agreed to come into the office on 01/01/2019 at 8:30 with a check in time of 8:00. She was very appreciative for the quick call back. No other questions or concerns.

## 2018-12-31 NOTE — Telephone Encounter (Signed)
Pt called stating that she bumped her head about 4 days ago and ever since then she has had a headache. She would like to discuss with RN what she can do to alleviate the pain. Please advise.

## 2018-12-31 NOTE — Telephone Encounter (Signed)
Spoke with the patient and stated that she was helping her mother move stuff out of the freezer. She didn't realize the freezer door was open and when she stood she hit the back of head pretty hard. She stated that she has had a migraine ever since. She has only taken Advil with no relief. Please advise.

## 2018-12-31 NOTE — Telephone Encounter (Signed)
She will need an appointment

## 2019-01-01 ENCOUNTER — Other Ambulatory Visit: Payer: Self-pay

## 2019-01-01 ENCOUNTER — Ambulatory Visit: Payer: PPO | Admitting: Adult Health

## 2019-01-01 ENCOUNTER — Encounter: Payer: PPO | Admitting: Physical Therapy

## 2019-01-01 ENCOUNTER — Encounter: Payer: Self-pay | Admitting: Adult Health

## 2019-01-01 VITALS — BP 129/80 | HR 64 | Temp 96.8°F | Ht 70.0 in | Wt 257.4 lb

## 2019-01-01 DIAGNOSIS — G43011 Migraine without aura, intractable, with status migrainosus: Secondary | ICD-10-CM

## 2019-01-01 NOTE — Progress Notes (Signed)
PATIENT: [name] DOB: [birth year]  REASON FOR VISIT: follow up HISTORY FROM: patient  HISTORY OF PRESENT ILLNESS: Today 01/01/19:  [name] is a 48 year old female with a history of postconcussive syndrome and migraine headaches.  She returns today for follow-up.  She states that she hit her head on the freezer door and has had a migraine since then.  She states that she bent over to get something out of the refrigerator and when she stood up she hit the top of her head on the freezer door.  She states that she has had her typical migraine symptoms.  The migraine is left-sided.  She states that she is a little more dizzier than normal.  No vision changes.  No weakness in the arms or legs.  She states that she continues to have trouble with her memory.  She reports being forgetful with appointments.  She is able to complete all ADLs independently.  She operates a Teacher, music without difficulty.  In the past Depacon infusion has been beneficial for her migraines.  HISTORY 08/05/18:  [name] is a 48 year old female with a history of postconcussive syndrome and migraine headaches.  She reports that Emgality has worked well for her headaches.  She states that she continues to notice some problems with her memory.  This is since her concussion.  She states that she is seeing her psychiatrist weekly.  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  She is not back at work yet.  She returns today for follow-up.   REVIEW OF SYSTEMS: Out of a complete 14 system review of symptoms, the patient complains only of the following symptoms, and all other reviewed systems are negative.  See HPI  ALLERGIES: Allergies  Allergen Reactions  . Adhesive [Tape] Itching and Rash    Also reacted to Steri Strips and Band-Aids.  . Dilaudid [Hydromorphone Hcl] Itching  . Morphine Nausea And Vomiting  . Penicillins Hives    Has patient had a PCN reaction causing immediate rash,  facial/tongue/throat swelling, SOB or lightheadedness with hypotension: YES Has patient had a PCN reaction causing severe rash involving mucus membranes or skin necrosis: NO Has patient had a PCN reaction that required hospitalization NO Has patient had a PCN reaction occurring within the last 10 years:NO If all of the above answers are "NO", then may proceed with Cephalosporin use.  Marland Kitchen Percocet [Oxycodone-Acetaminophen] Itching  . Prednisone Hives  . Provera [Medroxyprogesterone Acetate] Other (See Comments)    Causes manic episodes  . Ultram [Tramadol Hcl] Itching    HOME MEDICATIONS: Outpatient Medications Prior to Visit  Medication Sig Dispense Refill  . clobetasol cream (TEMOVATE) 0.05 %   3  . divalproex (DEPAKOTE ER) 500 MG 24 hr tablet TAKE 2 TABLETS BY MOUTH AT BEDTIME FOR MOOD CONTROL 60 tablet 5  . Galcanezumab-gnlm (EMGALITY) 120 MG/ML SOAJ Inject 120 mg into the skin every 30 (thirty) days. 1 mL 11  . hydrOXYzine (VISTARIL) 50 MG capsule TK ONE C PO TID PRA  2  . lamoTRIgine (LAMICTAL) 150 MG tablet Take 300 mg by mouth at bedtime.  1  . levocetirizine (XYZAL) 5 MG tablet Take 5 mg by mouth every evening.  1  . lurasidone (LATUDA) 80 MG TABS tablet Take 1 tablet (80 mg total) by mouth daily with supper. For mood control 30 tablet 0  . meloxicam (MOBIC) 15 MG tablet TK 1 T PO QD    . metoprolol succinate (TOPROL-XL) 100 MG  24 hr tablet Take 1 tablet (100 mg total) by mouth daily. Take with or immediately following a meal. 30 tablet 0  . promethazine (PHENERGAN) 25 MG tablet TK 1 T PO BID FOR 15 DAYS PRN  0  . SUMAtriptan Succinate Refill 6 MG/0.5ML SOCT Inject 0.5 ml SQ at the onset of migraine. May repeat in 2 hours if needed. 0.5 mL 5  . traZODone (DESYREL) 50 MG tablet Take 1 tablet (50 mg total) by mouth at bedtime as needed for sleep. (Patient taking differently: Take 100 mg by mouth. ) 30 tablet 0  . diclofenac (VOLTAREN) 75 MG EC tablet Take 75 mg by mouth 2 (two) times  daily.     Facility-Administered Medications Prior to Visit  Medication Dose Route Frequency Provider Last Rate Last Dose  . methylPREDNISolone acetate (DEPO-MEDROL) injection 40 mg  40 mg Intra-articular Once Hudnall, Sharyn Lull, MD      . valproate (DEPACON) 1,000 mg in dextrose 5 % 50 mL IVPB  1,000 mg Intravenous Once Dohmeier, Asencion Partridge, MD        PAST MEDICAL HISTORY: Past Medical History:  Diagnosis Date  . Achilles tendinitis   . Achilles tendinitis   . ADHD (attention deficit hyperactivity disorder)   . Allergy   . Arthritis   . Bipolar affective ([place])   . Bipolar disorder ([place])   . Cataracts, bilateral   . Eczema   . Family history of breast cancer   . Family history of colon cancer   . Family history of genetic disease carrier   . Ganglion cyst 09/29/2009   left wrist (2 cyst)  . Hyperprolactinemia ([place])   . Hypertension   . Lipoma   . Migraine   . Personality disorder ([place])   . Pes planus     PAST SURGICAL HISTORY: Past Surgical History:  Procedure Laterality Date  . ANKLE SURGERY  12/88   left   . chest nodule  1990?   rt chest wall nodule removal  . GANGLION CYST EXCISION  2011  . lipoma removal    . right bunioectomy    . SHOULDER SURGERY  01/13/2011   right, partial tear  . tumor resection left thigh      FAMILY HISTORY: Family History  Problem Relation Age of Onset  . Hypertension Mother   . Hyperlipidemia Mother   . Breast cancer Mother 32       genetic testing- CHEK2 likely pathogenic variant  . Heart attack Father   . Heart disease Father   . Hypertension Father   . Bipolar disorder Father   . Diabetes Paternal Grandfather   . Heart disease Maternal Aunt   . Breast cancer Maternal Aunt 65  . Heart disease Maternal Grandmother   . Colon cancer Maternal Grandmother 66  . Colon cancer Maternal Uncle 75  . Lymphoma Maternal Grandfather 42  . Breast cancer Maternal Aunt 65  . Breast cancer Maternal Aunt 35  . Breast cancer Maternal Aunt    . Bone cancer Cousin 87  . Cervical cancer Cousin        Genetic testing- 'was positive for a gene' had a mastectomy    SOCIAL HISTORY: Social History   Socioeconomic History  . Marital status: Single    Spouse name: Not on file  . Number of children: 0  . Years of education: Not on file  . Highest education level: Not on file  Occupational History    Employer: [employer]  .  Financial resource strain: Not on file  . Food insecurity    Worry: Not on file    Inability: Not on file  . Transportation needs    Medical: Not on file    Non-medical: Not on file  Tobacco Use  . Smoking status: Never Smoker  . Smokeless tobacco: Never Used  Substance and Sexual Activity  . Alcohol use: No    Alcohol/week: 0.0 standard drinks  . Drug use: No  . Sexual activity: Never    Birth control/protection: Pill  Lifestyle  . Physical activity    Days per week: Not on file    Minutes per session: Not on file  . Stress: Not on file  Relationships  . Social Herbalist on phone: Not on file    Gets together: Not on file    Attends religious service: Not on file    Active member of club or organization: Not on file    Attends meetings of clubs or organizations: Not on file    Relationship status: Not on file  . Intimate partner violence    Fear of current or ex partner: Not on file    Emotionally abused: Not on file    Physically abused: Not on file    Forced sexual activity: Not on file  Other Topics Concern  . Not on file  Social History Narrative   Caffeine  2 sodas daily, 1 cup coffee daily.      PHYSICAL EXAM  Vitals:   01/01/19 0821  BP: 129/80  Pulse: 64  Temp: (!) 96.8 F (36 C)  TempSrc: Oral  Weight: 257 lb 6.4 oz (116.8 kg)  Height: 5' 10"  (1.778 m)   Body mass index is 36.93 kg/m.  Generalized: Well developed, in no acute distress   Neurological examination  Mentation: Alert oriented to time, place, history taking. Follows all commands  speech and language fluent Cranial nerve II-XII: Pupils were equal round reactive to light. Extraocular movements were full, visual field were full on confrontational test.  Head turning and shoulder shrug  were normal and symmetric. Motor: The motor testing reveals 5 over 5 strength of all 4 extremities. Good symmetric motor tone is noted throughout.  Sensory: Sensory testing is intact to soft touch on all 4 extremities. No evidence of extinction is noted.  Coordination: Cerebellar testing reveals good finger-nose-finger and heel-to-shin bilaterally.  Gait and station: Gait is normal.  Tandem gait unsteady Reflexes: Deep tendon reflexes are symmetric and normal bilaterally.   DIAGNOSTIC DATA (LABS, IMAGING, TESTING) - I reviewed patient records, labs, notes, testing and imaging myself where available.  Lab Results  Component Value Date   WBC 7.0 08/05/2018   HGB 14.0 08/05/2018   HCT 40.1 08/05/2018   MCV 90 08/05/2018   PLT 205 08/05/2018      Component Value Date/Time   NA 140 08/05/2018 1435   K 5.3 (H) 08/05/2018 1435   CL 104 08/05/2018 1435   CO2 23 08/05/2018 1435   GLUCOSE 105 (H) 08/05/2018 1435   GLUCOSE 88 12/29/2016 0627   BUN 15 08/05/2018 1435   CREATININE 1.17 (H) 08/05/2018 1435   CREATININE 0.83 09/11/2011 1524   CALCIUM 10.1 08/05/2018 1435   PROT 6.1 08/05/2018 1435   ALBUMIN 4.2 08/05/2018 1435   AST 13 08/05/2018 1435   ALT 9 08/05/2018 1435   ALKPHOS 68 08/05/2018 1435   BILITOT 0.4 08/05/2018 1435   GFRNONAA 55 (L) 08/05/2018 1435   GFRAA  64 08/05/2018 1435   Lab Results  Component Value Date   CHOL 198 12/29/2016   HDL 53 12/29/2016   LDLCALC 118 (H) 12/29/2016   TRIG 136 12/29/2016   CHOLHDL 3.7 12/29/2016   Lab Results  Component Value Date   HGBA1C 5.1 12/29/2016   No results found for: [id number] Lab Results  Component Value Date   TSH 4.288 12/29/2016      ASSESSMENT AND PLAN 48 y.o. year old female  has a past medical history  of Achilles tendinitis, Achilles tendinitis, ADHD (attention deficit hyperactivity disorder), Allergy, Arthritis, Bipolar affective ([place]), Bipolar disorder ([place]), Cataracts, bilateral, Eczema, Family history of breast cancer, Family history of colon cancer, Family history of genetic disease carrier, Ganglion cyst (09/29/2009), Hyperprolactinemia ([place]), Hypertension, Lipoma, Migraine, Personality disorder ([place]), and Pes planus. here with:  1.  Migraine headache  Depacon infusion has been beneficial in the past for breaking her headache cycle.  I will order Depacon IV at 1000 mg today.  I have advised that if her headache worsens or she develops new symptoms she should let us know.  Her physical exam was relatively unremarkable today.  She will follow-up in 6 months or sooner if needed.  I spent 15 minutes with the patient. 50% of this time was spent reviewing plan of care   Ward Givens, MSN, NP-C 01/01/2019, 8:32 AM [facility] [address] [place],  [address] [phone]

## 2019-01-01 NOTE — Patient Instructions (Signed)
Your Plan:  Depacon infusion today If your symptoms worsen or you develop new symptoms please let us know.   Thank you for coming to see [place] at [facility]. I hope we have been able to provide you high quality care today.  You may receive a patient satisfaction survey over the next few weeks. We would appreciate your feedback and comments so that we may continue to improve ourselves and the health of our patients.

## 2019-01-04 NOTE — Progress Notes (Signed)
I have read the note, and I agree with the clinical assessment and plan.  [name] K [name]   

## 2019-01-06 ENCOUNTER — Ambulatory Visit: Payer: PPO | Attending: Family Medicine | Admitting: Physical Therapy

## 2019-01-06 ENCOUNTER — Other Ambulatory Visit: Payer: Self-pay

## 2019-01-06 ENCOUNTER — Encounter: Payer: Self-pay | Admitting: Physical Therapy

## 2019-01-06 DIAGNOSIS — M25612 Stiffness of left shoulder, not elsewhere classified: Secondary | ICD-10-CM | POA: Insufficient documentation

## 2019-01-06 DIAGNOSIS — M25512 Pain in left shoulder: Secondary | ICD-10-CM | POA: Diagnosis not present

## 2019-01-06 DIAGNOSIS — M542 Cervicalgia: Secondary | ICD-10-CM | POA: Diagnosis not present

## 2019-01-06 DIAGNOSIS — R252 Cramp and spasm: Secondary | ICD-10-CM | POA: Insufficient documentation

## 2019-01-06 NOTE — Therapy (Signed)
[place] [place], [place], [address] Phone: [phone]   Fax:  [phone]  Physical Therapy Treatment  Patient Details  Name: [name] MRN: [id number] Date of Birth: 1970-12-17 Referring Provider (PT): [place], [place]   Encounter Date: 01/06/2019  PT End of Session - 01/06/19 1639    Visit Number  [id number]    Date for PT Re-Evaluation  01/15/19    PT Start Time  5176    PT Stop Time  1652    PT Time Calculation (min)  55 min    Activity Tolerance  Patient tolerated treatment well    Behavior During Therapy  [facility] for tasks assessed/performed;Anxious       Past Medical History:  Diagnosis Date  . Achilles tendinitis   . Achilles tendinitis   . ADHD (attention deficit hyperactivity disorder)   . Allergy   . Arthritis   . Bipolar affective ([place])   . Bipolar disorder ([place])   . Cataracts, bilateral   . Eczema   . Family history of breast cancer   . Family history of colon cancer   . Family history of genetic disease carrier   . Ganglion cyst 09/29/2009   left wrist (2 cyst)  . Hyperprolactinemia ([place])   . Hypertension   . Lipoma   . Migraine   . Personality disorder ([place])   . Pes planus     Past Surgical History:  Procedure Laterality Date  . ANKLE SURGERY  12/88   left   . chest nodule  1990?   rt chest wall nodule removal  . GANGLION CYST EXCISION  2011  . lipoma removal    . right bunioectomy    . SHOULDER SURGERY  01/13/2011   right, partial tear  . tumor resection left thigh      There were no vitals filed for this visit.  Subjective Assessment - 01/06/19 1557    Subjective  Pt reports that she has a calf strain. MD is pleased with the progress.    Currently in Pain?  Yes    Pain Score  2     Pain Location  Shoulder    Pain Orientation  Left                       [facility] Adult PT Treatment/Exercise - 01/06/19 0001      Therapeutic Activites    Therapeutic Activities  Work Simulation   1lb cuff around wrist   Work Simulation  had her fold towels and then put them in cabinet at shoulder and head high, then taking a 3# weight from in front of her pulling to her then putting out to side and then back 3 points      Shoulder Exercises: Standing   Horizontal ABduction  Strengthening;Both;20 reps;Theraband    Theraband Level (Shoulder Horizontal ABduction)  Level 2 (Red)    External Rotation  20 reps;Theraband;Left    Theraband Level (Shoulder External Rotation)  Level 2 (Red)    Internal Rotation  Left;20 reps;Theraband    Theraband Level (Shoulder Internal Rotation)  Level 2 (Red)    Extension  Strengthening;Both;15 reps;Weights   x2   Extension Weight (lbs)  5    Other Standing Exercises  triceps ext 20lb 2x10      Shoulder Exercises: ROM/Strengthening   UBE (Upper Arm Bike)  level 4.5 x 6 minutes    Lat Pull  20 reps;2.5 plate  Cybex Row  2.5 plate;20 reps      Moist Heat Therapy   Number Minutes Moist Heat  15 Minutes    Moist Heat Location  Shoulder      Electrical Stimulation   Electrical Stimulation Location  left shoulder and rhomboid area    Electrical Stimulation Action  IFC    Electrical Stimulation Parameters  supine    Electrical Stimulation Goals  Pain                  PT Long Term Goals - 12/30/18 1224      PT LONG TERM GOAL #1   Title  decrease pain in the left shoulder 50%    Status  Partially Met      PT LONG TERM GOAL #2   Title  report able to dress and do hair without difficulty    Status  Partially Met      PT LONG TERM GOAL #3   Title  increase left shoulder AROM for flexion to 140 degrees    Status  Partially Met      PT LONG TERM GOAL #4   Title  increase left shoulder IR to 65 degrees            Plan - 01/06/19 1642    Clinical Impression Statement  Pt stated that her MD was pleased with her shoulder progress. Increase resistance with tolerated with internal/  external rotation and with seated rows. Attempted to increase resistance with lat pull downs bit reports a twinge in her shoulder. Postural cues needed with shoulder extensions.    Rehab Potential  Good    PT Frequency  2x / week    PT Treatment/Interventions  ADLs/Self Care Home Management;Cryotherapy;Electrical Stimulation;Moist Heat;Ultrasound;Therapeutic activities;Therapeutic exercise;Manual techniques;Patient/family education    PT Next Visit Plan  work on her work simulation       Patient will benefit from skilled therapeutic intervention in order to improve the following deficits and impairments:  Pain, Improper body mechanics, Increased muscle spasms, Postural dysfunction, Impaired UE functional use, Decreased strength, Decreased range of motion  Visit Diagnosis: Acute pain of left shoulder  Stiffness of left shoulder, not elsewhere classified     Problem List Patient Active Problem List   Diagnosis Date Noted  . 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 Genetic testing 02/27/2018  . Family history of genetic disease carrier   . Family history of breast cancer   . Family history of colon cancer   . Head injury with loss of consciousness ([place]) 11/20/2017  . Nausea 11/20/2017  . 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 Tremor observed on examination 11/20/2017  . Left shoulder pain 02/18/2017  . MDD (major depressive disorder), recurrent severe, without psychosis ([place]) 12/28/2016  . Muscle strain 09/19/2016  . Right foot injury, subsequent encounter 08/25/2016  . Low back pain 06/27/2016  . Left wrist injury, subsequent encounter 06/27/2016  . Pain of left thumb 10/07/2015  . 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 Strain of left thumb 02/11/2015  . Strain of right forearm 02/11/2015  . Right shoulder pain 12/31/2014  . Injury of right little finger  12/31/2014  . Episodic cluster headache, not intractable 12/02/2014  . 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 Hypersomnia, recurrent 12/02/2014  . Migraine aura, persistent, intractable, with status migrainosus 12/02/2014  . Lower  back injury 09/30/2014  . Bipolar I disorder, most recent episode depressed ([place])   . MDD (major depressive disorder), recurrent, severe, with psychosis ([place]) 09/23/2014  . Injury of left index finger 08/20/2014  . Right ankle sprain 06/01/2014  . Contusion, multiple sites 06/01/2014  . Strain of right gastrocnemius muscle 06/01/2014  . Phonophobia 05/04/2014  . Photophobia of both eyes 05/04/2014  . Emotionally unstable borderline personality disorder ([place]) 05/04/2014  . Nausea with vomiting 05/04/2014  . Mixed bipolar I disorder ([place])   . Bipolar I disorder, most recent episode mixed ([place]) 04/18/2014  . Bipolar affective disorder, depressed, mild ([place]) 04/12/2014  . Suicidal ideation 04/12/2014  . Injury of right shoulder and upper arm 02/17/2014  . Left leg pain 06/03/2013  . Right hip pain 06/03/2013  . Migraine with status migrainosus 01/08/2013  . Personality disorder ([place])   . Chronic migraine 05/08/2012  . Contact dermatitis 11/27/2011  . Major depressive disorder, recurrent episode ([place]) 10/27/2011  . Generalized anxiety disorder 10/27/2011  . 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 Borderline personality disorder ([place]) 10/27/2011  . Right foot pain 09/28/2011  . 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 Ganglion cyst 09/29/2009  . PES PLANUS 07/01/2008  . BIPOLAR DISORDER UNSPECIFIED 06/09/2008    Scot Jun, PTA 01/06/2019, 4:44 PM  [address] [place], [place], [address] Phone: [phone]   Fax:   [phone]  Name: MIREYAH CHERVENAK MRN: [id number] Date of Birth: November 02, 1970

## 2019-01-13 ENCOUNTER — Ambulatory Visit: Payer: PPO | Admitting: Physical Therapy

## 2019-01-13 ENCOUNTER — Other Ambulatory Visit: Payer: Self-pay

## 2019-01-13 ENCOUNTER — Encounter: Payer: Self-pay | Admitting: Physical Therapy

## 2019-01-13 DIAGNOSIS — M25512 Pain in left shoulder: Secondary | ICD-10-CM | POA: Diagnosis not present

## 2019-01-13 DIAGNOSIS — M25612 Stiffness of left shoulder, not elsewhere classified: Secondary | ICD-10-CM

## 2019-01-13 DIAGNOSIS — M542 Cervicalgia: Secondary | ICD-10-CM

## 2019-01-13 NOTE — Therapy (Signed)
[place] [place], [place], [address] Phone: [phone]   Fax:  [phone]  Physical Therapy Treatment  Patient Details  Name: [name] MRN: [id number] Date of Birth: 04-Oct-1970 Referring Provider (PT): [place], [place]   Encounter Date: 01/13/2019  PT End of Session - 01/13/19 1341    Visit Number  20    Date for PT Re-Evaluation  01/15/19    PT Start Time  1300    PT Stop Time  7048    PT Time Calculation (min)  55 min    Activity Tolerance  Patient tolerated treatment well    Behavior During Therapy  [facility] for tasks assessed/performed;Anxious       Past Medical History:  Diagnosis Date  . Achilles tendinitis   . Achilles tendinitis   . ADHD (attention deficit hyperactivity disorder)   . Allergy   . Arthritis   . Bipolar affective ([place])   . Bipolar disorder ([place])   . Cataracts, bilateral   . Eczema   . Family history of breast cancer   . Family history of colon cancer   . Family history of genetic disease carrier   . Ganglion cyst 09/29/2009   left wrist (2 cyst)  . Hyperprolactinemia ([place])   . Hypertension   . Lipoma   . Migraine   . Personality disorder ([place])   . Pes planus     Past Surgical History:  Procedure Laterality Date  . ANKLE SURGERY  12/88   left   . chest nodule  1990?   rt chest wall nodule removal  . GANGLION CYST EXCISION  2011  . lipoma removal    . right bunioectomy    . SHOULDER SURGERY  01/13/2011   right, partial tear  . tumor resection left thigh      There were no vitals filed for this visit.  Subjective Assessment - 01/13/19 1258    Subjective  Pt reports that she has returned to work under light duty. Shoulder did pretty good cause she did not have to lift anything heavy. Hip hurts from standing on it    Currently in Pain?  Yes    Pain Score  2     Pain Location  Shoulder    Pain Orientation  Left;Anterior                        [facility] Adult PT Treatment/Exercise - 01/13/19 0001      Shoulder Exercises: Standing   External Rotation  20 reps;Theraband;Left    Theraband Level (Shoulder External Rotation)  Level 2 (Red)    Internal Rotation  Left;20 reps;Theraband    Theraband Level (Shoulder Internal Rotation)  Level 2 (Red)    Flexion  Right;20 reps;Theraband    Theraband Level (Shoulder Flexion)  Level 2 (Red)    ABduction  Left;Theraband;20 reps;Strengthening    Theraband Level (Shoulder ABduction)  Level 1 (Yellow)    Extension  Strengthening;Both;Weights;10 reps;20 reps    Extension Weight (lbs)  5    Other Standing Exercises  Chest press & flex  yellow ball 2x10     Other Standing Exercises  triceps ext 20lb 2x10      Shoulder Exercises: ROM/Strengthening   UBE (Upper Arm Bike)  level 5 x 6 minutes    Lat Pull  2 plate;15 reps   x2   Cybex Row  15 reps;2 plate   x2  Other ROM/Strengthening Exercises  2# cabinet reaching to shoulder height and head highflex and abd x10 each       Moist Heat Therapy   Number Minutes Moist Heat  15 Minutes    Moist Heat Location  Shoulder      Electrical Stimulation   Electrical Stimulation Location  left shoulder and rhomboid area    Electrical Stimulation Action  IFC    Electrical Stimulation Parameters  supine    Electrical Stimulation Goals  Pain                  PT Long Term Goals - 01/13/19 1342      PT LONG TERM GOAL #1   Title  decrease pain in the left shoulder 50%    Status  Achieved      PT LONG TERM GOAL #2   Title  report able to dress and do hair without difficulty    Status  Partially Met            Plan - 01/13/19 1343    Clinical Impression Statement  Pt continues to do well overall. L shoulder fatigue present with cabinet reaches. She reports the most difficulty with standing shoulder abduction. Tactile cues required with L shoulder external rotation with resistance. 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    Rehab Potential  Good    PT Duration  8 weeks    PT Treatment/Interventions  ADLs/Self Care Home Management;Cryotherapy;Electrical Stimulation;Moist Heat;Ultrasound;Therapeutic activities;Therapeutic exercise;Manual techniques;Patient/family education    PT Next Visit Plan  work on her work simulation       Patient will benefit from skilled therapeutic intervention in order to improve the following deficits and impairments:  Pain, Improper body mechanics, Increased muscle spasms, Postural dysfunction, Impaired UE functional use, Decreased strength, Decreased range of motion  Visit Diagnosis: Acute pain of left shoulder  Stiffness of left shoulder, not elsewhere classified  Cervicalgia     Problem List Patient Active Problem List   Diagnosis Date Noted  . 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 Genetic testing 02/27/2018  . Family history of genetic disease carrier   . Family history of breast cancer   . Family history of colon cancer   . Head injury with loss of consciousness ([place]) 11/20/2017  . Nausea 11/20/2017  . 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 Tremor observed on examination 11/20/2017  . Left shoulder pain 02/18/2017  . MDD (major depressive disorder), recurrent severe, without psychosis ([place]) 12/28/2016  . Muscle strain 09/19/2016  . Right foot injury, subsequent encounter 08/25/2016  . Low back pain 06/27/2016  . Left wrist injury, subsequent encounter 06/27/2016  . Pain of left thumb 10/07/2015  . 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 Strain of left thumb 02/11/2015  . Strain of right forearm 02/11/2015  . Right shoulder pain 12/31/2014  . Injury of right little finger 12/31/2014  . Episodic cluster headache, not intractable 12/02/2014  . Chronic  paroxysmal hemicrania, not intractable 12/02/2014  . 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 Hypersomnia, recurrent 12/02/2014  . Migraine aura, persistent, intractable, with status migrainosus 12/02/2014  . Lower back injury 09/30/2014  . Bipolar I disorder, most recent episode depressed ([place])   . MDD (major depressive disorder), recurrent, severe, with psychosis ([place]) 09/23/2014  . Injury of left index finger 08/20/2014  . Right  ankle sprain 06/01/2014  . Contusion, multiple sites 06/01/2014  . Strain of right gastrocnemius muscle 06/01/2014  . Phonophobia 05/04/2014  . Photophobia of both eyes 05/04/2014  . Emotionally unstable borderline personality disorder ([place]) 05/04/2014  . Nausea with vomiting 05/04/2014  . Mixed bipolar I disorder ([place])   . Bipolar I disorder, most recent episode mixed ([place]) 04/18/2014  . Bipolar affective disorder, depressed, mild ([place]) 04/12/2014  . Suicidal ideation 04/12/2014  . Injury of right shoulder and upper arm 02/17/2014  . Left leg pain 06/03/2013  . Right hip pain 06/03/2013  . Migraine with status migrainosus 01/08/2013  . Personality disorder ([place])   . Chronic migraine 05/08/2012  . Contact dermatitis 11/27/2011  . Major depressive disorder, recurrent episode ([place]) 10/27/2011  . Generalized anxiety disorder 10/27/2011  . 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 Borderline personality disorder ([place]) 10/27/2011  . Right foot pain 09/28/2011  . 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 Ganglion cyst 09/29/2009  . PES PLANUS 07/01/2008  . BIPOLAR DISORDER UNSPECIFIED 06/09/2008    Scot Jun, PTA 01/13/2019, 1:45 PM  [address] [place], [place], [address] Phone: [phone]   Fax:  [phone]  Name: [name] MRN: [id number] Date of Birth:  06/12/1970

## 2019-01-20 ENCOUNTER — Encounter: Payer: Self-pay | Admitting: Physical Therapy

## 2019-01-20 ENCOUNTER — Other Ambulatory Visit: Payer: Self-pay

## 2019-01-20 ENCOUNTER — Ambulatory Visit: Payer: PPO | Admitting: Physical Therapy

## 2019-01-20 DIAGNOSIS — M25512 Pain in left shoulder: Secondary | ICD-10-CM | POA: Diagnosis not present

## 2019-01-20 DIAGNOSIS — M25612 Stiffness of left shoulder, not elsewhere classified: Secondary | ICD-10-CM

## 2019-01-20 NOTE — Therapy (Signed)
[place], [place], [address] Phone: [phone]   Fax:  [phone]  Physical Therapy Treatment  Patient Details  Name: [name] MRN: [id number] Date of Birth: 29-Jul-1970 Referring Provider (PT): [place], [place]   Encounter Date: 01/20/2019  PT End of Session - 01/20/19 1338    Visit Number  21    PT Start Time  1300    PT Stop Time  1354    PT Time Calculation (min)  54 min    Activity Tolerance  Patient tolerated treatment well    Behavior During Therapy  [facility] for tasks assessed/performed       Past Medical History:  Diagnosis Date  . Achilles tendinitis   . Achilles tendinitis   . ADHD (attention deficit hyperactivity disorder)   . Allergy   . Arthritis   . Bipolar affective ([place])   . Bipolar disorder ([place])   . Cataracts, bilateral   . Eczema   . Family history of breast cancer   . Family history of colon cancer   . Family history of genetic disease carrier   . Ganglion cyst 09/29/2009   left wrist (2 cyst)  . Hyperprolactinemia ([place])   . Hypertension   . Lipoma   . Migraine   . Personality disorder ([place])   . Pes planus     Past Surgical History:  Procedure Laterality Date  . ANKLE SURGERY  12/88   left   . chest nodule  1990?   rt chest wall nodule removal  . GANGLION CYST EXCISION  2011  . lipoma removal    . right bunioectomy    . SHOULDER SURGERY  01/13/2011   right, partial tear  . tumor resection left thigh      There were no vitals filed for this visit.  Subjective Assessment - 01/20/19 1302    Subjective  "My shoulder may be like a two and a half"    Currently in Pain?  Yes    Pain Score  3     Pain Location  Shoulder    Pain Orientation  Left                       [place] Adult PT Treatment/Exercise - 01/20/19 0001      Therapeutic Activites    Therapeutic Activities  Work Simulation    Work Simulation  had her fold  towels and then put them in cabinet at shoulder and head high, then taking a 3# weight from in front of her pulling to her then putting out to side and then back 3 points      Shoulder Exercises: Standing   Horizontal ABduction  Strengthening;Both;20 reps;Theraband    Theraband Level (Shoulder Horizontal ABduction)  Level 2 (Red)    Internal Rotation  Left;20 reps;Theraband    Theraband Level (Shoulder Internal Rotation)  Level 2 (Red)    Flexion  Right;20 reps;Theraband    Theraband Level (Shoulder Flexion)  Level 2 (Red)    Extension  Strengthening;Both;Weights;10 reps;20 reps    Extension Weight (lbs)  5    Other Standing Exercises  Triceps ext 20lb 2x15    Other Standing Exercises  OHP 3lb 2x10; LUE ER anducted to 90 deg 2x10      Shoulder Exercises: ROM/Strengthening   UBE (Upper Arm Bike)  level 5 x 6 minutes    Lat Pull  2 plate;15 reps   x2  Cybex Row  15 reps;2 plate   [id number]   Other ROM/Strengthening Exercises  Chest press 5lb 2x15 n      Moist Heat Therapy   Number Minutes Moist Heat  15 Minutes    Moist Heat Location  Shoulder      Electrical Stimulation   Electrical Stimulation Location  L anterior shoulder     Electrical Stimulation Action  IFC    Electrical Stimulation Parameters  supine    Electrical Stimulation Goals  Pain                  PT Long Term Goals - 01/20/19 1341      PT LONG TERM GOAL #1   Title  decrease pain in the left shoulder 50%    Status  Achieved      PT LONG TERM GOAL #2   Title  report able to dress and do hair without difficulty    Status  Partially Met      PT LONG TERM GOAL #4   Title  increase left shoulder IR to 65 degrees    Status  Achieved            Plan - 01/20/19 1339    Clinical Impression Statement  Pt did well with today's interventions. She reports improvement in her shoulder overall. L shoulder fatigues quick with flex and abduction. No reports of increase pain, tactile cues needed to keep L  shoulder in proper position with external rotation abducted to 90. 2lb cuff weight around wrist with work simulation.    Stability/Clinical Decision Making  Evolving/Moderate complexity    Rehab Potential  Good    PT Frequency  2x / week    PT Duration  8 weeks    PT Treatment/Interventions  ADLs/Self Care Home Management;Cryotherapy;Electrical Stimulation;Moist Heat;Ultrasound;Therapeutic activities;Therapeutic exercise;Manual techniques;Patient/family education       Patient will benefit from skilled therapeutic intervention in order to improve the following deficits and impairments:  Pain, Improper body mechanics, Increased muscle spasms, Postural dysfunction, Impaired UE functional use, Decreased strength, Decreased range of motion  Visit Diagnosis: Acute pain of left shoulder  Stiffness of left shoulder, not elsewhere classified     Problem List Patient Active Problem List   Diagnosis Date Noted  . 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 Genetic testing 02/27/2018  . Family history of genetic disease carrier   . Family history of breast cancer   . Family history of colon cancer   . Head injury with loss of consciousness ([place]) 11/20/2017  . Nausea 11/20/2017  . 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 Tremor observed on examination 11/20/2017  . Left shoulder pain 02/18/2017  . MDD (major depressive disorder), recurrent severe, without psychosis ([place]) 12/28/2016  . Muscle strain 09/19/2016  . Right foot injury, subsequent encounter 08/25/2016  . Low back pain 06/27/2016  . Left wrist injury, subsequent encounter 06/27/2016  . Pain of left thumb 10/07/2015  . 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 Strain of left thumb 02/11/2015  . Strain of right forearm 02/11/2015  . Right shoulder pain 12/31/2014  . Injury of right little finger 12/31/2014  .  Episodic cluster headache, not intractable 12/02/2014  . 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 Hypersomnia, recurrent 12/02/2014  . Migraine aura, persistent, intractable, with status migrainosus 12/02/2014  . Lower back injury  09/30/2014  . Bipolar I disorder, most recent episode depressed ([place])   . MDD (major depressive disorder), recurrent, severe, with psychosis ([place]) 09/23/2014  . Injury of left index finger 08/20/2014  . Right ankle sprain 06/01/2014  . Contusion, multiple sites 06/01/2014  . Strain of right gastrocnemius muscle 06/01/2014  . Phonophobia 05/04/2014  . Photophobia of both eyes 05/04/2014  . Emotionally unstable borderline personality disorder ([place]) 05/04/2014  . Nausea with vomiting 05/04/2014  . Mixed bipolar I disorder ([place])   . Bipolar I disorder, most recent episode mixed ([place]) 04/18/2014  . Bipolar affective disorder, depressed, mild ([place]) 04/12/2014  . Suicidal ideation 04/12/2014  . Injury of right shoulder and upper arm 02/17/2014  . Left leg pain 06/03/2013  . Right hip pain 06/03/2013  . Migraine with status migrainosus 01/08/2013  . Personality disorder ([place])   . Chronic migraine 05/08/2012  . Contact dermatitis 11/27/2011  . Major depressive disorder, recurrent episode ([place]) 10/27/2011  . Generalized anxiety disorder 10/27/2011  . 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 Borderline personality disorder ([place]) 10/27/2011  . Right foot pain 09/28/2011  . 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 Ganglion cyst 09/29/2009  . PES PLANUS 07/01/2008  . BIPOLAR DISORDER UNSPECIFIED 06/09/2008    Scot Jun, PTA 01/20/2019, 1:41 PM  [address] [place], [place], [address] Phone: [phone]   Fax:   [phone]  Name: [name] MRN: [id number] Date of Birth: 26-Jun-1970

## 2019-01-27 ENCOUNTER — Other Ambulatory Visit: Payer: Self-pay

## 2019-01-27 ENCOUNTER — Ambulatory Visit: Payer: PPO | Admitting: Physical Therapy

## 2019-01-27 ENCOUNTER — Telehealth: Payer: Self-pay | Admitting: Adult Health

## 2019-01-27 ENCOUNTER — Encounter: Payer: Self-pay | Admitting: Physical Therapy

## 2019-01-27 DIAGNOSIS — M25512 Pain in left shoulder: Secondary | ICD-10-CM

## 2019-01-27 DIAGNOSIS — M542 Cervicalgia: Secondary | ICD-10-CM

## 2019-01-27 DIAGNOSIS — R252 Cramp and spasm: Secondary | ICD-10-CM

## 2019-01-27 DIAGNOSIS — M25612 Stiffness of left shoulder, not elsewhere classified: Secondary | ICD-10-CM

## 2019-01-27 NOTE — Telephone Encounter (Signed)
LMVM for pt that returned call.  °

## 2019-01-27 NOTE — Telephone Encounter (Signed)
LMVM for pt to return call.   

## 2019-01-27 NOTE — Therapy (Signed)
[place] [address] [place], [place], [address] Phone: [phone]   Fax:  [phone]  Physical Therapy Treatment  Patient Details  Name: [name] MRN: [id number] Date of Birth: 1970/09/14 Referring Provider (PT): [place], [place]   Encounter Date: 01/27/2019  PT End of Session - 01/27/19 1408    Visit Number  22    Date for PT Re-Evaluation  02/15/19    PT Start Time  1103    PT Stop Time  1412    PT Time Calculation (min)  59 min    Activity Tolerance  Patient tolerated treatment well    Behavior During Therapy  [facility] for tasks assessed/performed       Past Medical History:  Diagnosis Date  . Achilles tendinitis   . Achilles tendinitis   . ADHD (attention deficit hyperactivity disorder)   . Allergy   . Arthritis   . Bipolar affective ([place])   . Bipolar disorder ([place])   . Cataracts, bilateral   . Eczema   . Family history of breast cancer   . Family history of colon cancer   . Family history of genetic disease carrier   . Ganglion cyst 09/29/2009   left wrist (2 cyst)  . Hyperprolactinemia ([place])   . Hypertension   . Lipoma   . Migraine   . Personality disorder ([place])   . Pes planus     Past Surgical History:  Procedure Laterality Date  . ANKLE SURGERY  12/88   left   . chest nodule  1990?   rt chest wall nodule removal  . GANGLION CYST EXCISION  2011  . lipoma removal    . right bunioectomy    . SHOULDER SURGERY  01/13/2011   right, partial tear  . tumor resection left thigh      There were no vitals filed for this visit.  Subjective Assessment - 01/27/19 1315    Subjective  Patient reports that she has had a migraine for the past 5 days.  She reports that she has had to work more recently, has had some dizziness    Currently in Pain?  Yes    Pain Score  3     Pain Location  Shoulder   migraine HA 7/10   Pain Orientation  Left    Aggravating Factors   stress                        [facility] Adult PT Treatment/Exercise - 01/27/19 0001      Therapeutic Activites    Therapeutic Activities  Work Simulation    Work Simulation  had her fold towels and then put them in cabinet at shoulder and head high, then taking a 3# weight from in front of her pulling to her then putting out to side and then back 3 points      Shoulder Exercises: Standing   Horizontal ABduction  Strengthening;Both;20 reps;Theraband    Theraband Level (Shoulder Horizontal ABduction)  Level 2 (Red)      Shoulder Exercises: ROM/Strengthening   UBE (Upper Arm Bike)  level 5 x 6 minutes    Lat Pull  2 plate;15 reps    Lat Pull Limitations  2 sets    Cybex Row  15 reps;2 plate    Cybex Row Limitations  2 sets    Other ROM/Strengthening Exercises  2# cabinet reaching to shoulder height and head highflex and abd x10  each     Other ROM/Strengthening Exercises  Chest press 5lb 2x15 n      Moist Heat Therapy   Number Minutes Moist Heat  15 Minutes    Moist Heat Location  Shoulder      Electrical Stimulation   Electrical Stimulation Location  left shoulder into the cervical area    Electrical Stimulation Action  IFC    Electrical Stimulation Parameters  supine    Electrical Stimulation Goals  Pain      Manual Therapy   Manual Therapy  Soft tissue mobilization    Soft tissue mobilization  upper traps and the cervical paraspinals, gentle occipital release fo rher HA                   PT Long Term Goals - 01/27/19 1412      PT LONG TERM GOAL #1   Title  decrease pain in the left shoulder 50%    Status  Achieved      PT LONG TERM GOAL #2   Title  report able to dress and do hair without difficulty    Status  Partially Met      PT LONG TERM GOAL #3   Title  increase left shoulder AROM for flexion to 140 degrees    Status  Partially Met            Plan - 01/27/19 1409    Clinical Impression Statement  Patient reports having a migraine the past 4-5  days, she had increased tightness in the upper traps and the cervical paraspinals.  Reports that she has been working more 33 hours last week and able to tolerate it but still having pain and fatiugue wiht activities    PT Next Visit Plan  see how the spasms and the migraine is, return to exercise and work simulation if tolerated    Consulted and Agree with Plan of Care  Patient       Patient will benefit from skilled therapeutic intervention in order to improve the following deficits and impairments:  Pain, Improper body mechanics, Increased muscle spasms, Postural dysfunction, Impaired UE functional use, Decreased strength, Decreased range of motion  Visit Diagnosis: Acute pain of left shoulder  Stiffness of left shoulder, not elsewhere classified  Cervicalgia  Cramp and spasm     Problem List Patient Active Problem List   Diagnosis Date Noted  . 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 Genetic testing 02/27/2018  . Family history of genetic disease carrier   . Family history of breast cancer   . Family history of colon cancer   . Head injury with loss of consciousness ([place]) 11/20/2017  . Nausea 11/20/2017  . 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 Tremor observed on examination 11/20/2017  . Left shoulder pain 02/18/2017  . MDD (major depressive disorder), recurrent severe, without psychosis ([place]) 12/28/2016  . Muscle strain 09/19/2016  . Right foot injury, subsequent encounter 08/25/2016  . Low back pain 06/27/2016  . Left wrist injury, subsequent encounter 06/27/2016  . Pain of left thumb 10/07/2015  . 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 Strain of left thumb 02/11/2015  . Strain of right forearm 02/11/2015  . Right shoulder pain 12/31/2014  . Injury of right little finger 12/31/2014  . Episodic cluster headache, not intractable 12/02/2014  .  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 Parasomnia  overlap disorder 12/02/2014  . Hypersomnia, recurrent 12/02/2014  . Migraine aura, persistent, intractable, with status migrainosus 12/02/2014  . Lower back injury 09/30/2014  . Bipolar I disorder, most recent episode depressed ([place])   . MDD (major depressive disorder), recurrent, severe, with psychosis ([place]) 09/23/2014  . Injury of left index finger 08/20/2014  . Right ankle sprain 06/01/2014  . Contusion, multiple sites 06/01/2014  . Strain of right gastrocnemius muscle 06/01/2014  . Phonophobia 05/04/2014  . Photophobia of both eyes 05/04/2014  . Emotionally unstable borderline personality disorder ([place]) 05/04/2014  . Nausea with vomiting 05/04/2014  . Mixed bipolar I disorder ([place])   . Bipolar I disorder, most recent episode mixed ([place]) 04/18/2014  . Bipolar affective disorder, depressed, mild ([place]) 04/12/2014  . Suicidal ideation 04/12/2014  . Injury of right shoulder and upper arm 02/17/2014  . Left leg pain 06/03/2013  . Right hip pain 06/03/2013  . Migraine with status migrainosus 01/08/2013  . Personality disorder ([place])   . Chronic migraine 05/08/2012  . Contact dermatitis 11/27/2011  . Major depressive disorder, recurrent episode ([place]) 10/27/2011  . Generalized anxiety disorder 10/27/2011  . 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 Borderline personality disorder ([place]) 10/27/2011  . Right foot pain 09/28/2011  . 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 Ganglion cyst 09/29/2009  . PES PLANUS 07/01/2008  . BIPOLAR DISORDER UNSPECIFIED 06/09/2008    Sumner Boast., PT 01/27/2019, 2:12 PM  [address] [place], [place], [address] Phone: [phone]   Fax:  [phone]  Name: [name] MRN: [id number] Date of Birth:  1970/01/31

## 2019-01-27 NOTE — Telephone Encounter (Signed)
Patient called back due to missed call. Please follow up

## 2019-01-27 NOTE — Telephone Encounter (Signed)
Patient paged me, I called and she did not pick up. I left a message saying the office is closed but she has a lot of different medications on her med list that she could try like diclofenac, hydroxyzine, meloxicam, phenergan, sumatriptan - all these meds can be used for vertigo I would try them one at a time to see if they help. If symptoms are severe or for new symptoms go to the emergency room. Please call her tomorrow thanks

## 2019-01-27 NOTE — Telephone Encounter (Signed)
Patient called in stating that she is having difficulty standing up and have episodes of dizziness frequently

## 2019-01-28 MED ORDER — PROMETHAZINE HCL 25 MG PO TABS: ORAL_TABLET | ORAL | 0 refills | Status: DC

## 2019-01-28 MED ORDER — SUMATRIPTAN SUCCINATE REFILL 6 MG/0.5ML ~~LOC~~ SOCT: SUBCUTANEOUS | 5 refills | Status: DC

## 2019-01-28 NOTE — Telephone Encounter (Signed)
Pt returned call. She stated that her phone does not ring, but she has gotten our VM.  She has not tried the imitrix inj, or phenergan.  She takes the vistaril she states BID.  Her sx are L eye temple, eye pain, hot feeling.  Nausea, dizziness, offbalance (having to hold on to buggy at work).  She is willing to try the imitrex and phenergan.

## 2019-01-28 NOTE — Addendum Note (Signed)
Addended by: Brandon Melnick on: 01/28/2019 10:08 AM   Modules accepted: Orders

## 2019-01-28 NOTE — Telephone Encounter (Signed)
LMVM for pt to return call,  I relayed that Dr. Jaynee Eagles called her last night, and LM as well.

## 2019-01-28 NOTE — Telephone Encounter (Signed)
I am OK with phenergan refills.  Can you prep the prescription , please/

## 2019-01-28 NOTE — Addendum Note (Signed)
Addended by: Larey Seat on: 01/28/2019 01:15 PM   Modules accepted: Orders

## 2019-02-03 ENCOUNTER — Encounter: Payer: Self-pay | Admitting: Physical Therapy

## 2019-02-03 ENCOUNTER — Other Ambulatory Visit: Payer: Self-pay

## 2019-02-03 ENCOUNTER — Ambulatory Visit: Payer: PPO | Attending: Family Medicine | Admitting: Physical Therapy

## 2019-02-03 DIAGNOSIS — R252 Cramp and spasm: Secondary | ICD-10-CM | POA: Insufficient documentation

## 2019-02-03 DIAGNOSIS — G43001 Migraine without aura, not intractable, with status migrainosus: Secondary | ICD-10-CM | POA: Diagnosis not present

## 2019-02-03 DIAGNOSIS — F319 Bipolar disorder, unspecified: Secondary | ICD-10-CM | POA: Diagnosis not present

## 2019-02-03 DIAGNOSIS — M542 Cervicalgia: Secondary | ICD-10-CM | POA: Insufficient documentation

## 2019-02-03 DIAGNOSIS — F3130 Bipolar disorder, current episode depressed, mild or moderate severity, unspecified: Secondary | ICD-10-CM | POA: Diagnosis not present

## 2019-02-03 DIAGNOSIS — I10 Essential (primary) hypertension: Secondary | ICD-10-CM | POA: Diagnosis not present

## 2019-02-03 DIAGNOSIS — M25512 Pain in left shoulder: Secondary | ICD-10-CM | POA: Insufficient documentation

## 2019-02-03 DIAGNOSIS — M25612 Stiffness of left shoulder, not elsewhere classified: Secondary | ICD-10-CM | POA: Diagnosis not present

## 2019-02-03 NOTE — Telephone Encounter (Signed)
Patient called in and stated that she is still experiencing a migraine, the sumatriptan only helped for a few hours then returned.

## 2019-02-03 NOTE — Therapy (Signed)
[place] [address] [place], [place], [address] Phone: [phone]   Fax:  [phone]  Physical Therapy Treatment  Patient Details  Name: [name] MRN: [id number] Date of Birth: 07-31-1970 Referring Provider (PT): [place], [place]   Encounter Date: 02/03/2019  PT End of Session - 02/03/19 1607    Visit Number  23    Date for PT Re-Evaluation  02/15/19    PT Start Time  8916    PT Stop Time  1620    PT Time Calculation (min)  45 min    Activity Tolerance  Patient limited by pain    Behavior During Therapy  [facility] for tasks assessed/performed       Past Medical History:  Diagnosis Date  . Achilles tendinitis   . Achilles tendinitis   . ADHD (attention deficit hyperactivity disorder)   . Allergy   . Arthritis   . Bipolar affective ([place])   . Bipolar disorder ([place])   . Cataracts, bilateral   . Eczema   . Family history of breast cancer   . Family history of colon cancer   . Family history of genetic disease carrier   . Ganglion cyst 09/29/2009   left wrist (2 cyst)  . Hyperprolactinemia ([place])   . Hypertension   . Lipoma   . Migraine   . Personality disorder ([place])   . Pes planus     Past Surgical History:  Procedure Laterality Date  . ANKLE SURGERY  12/88   left   . chest nodule  1990?   rt chest wall nodule removal  . GANGLION CYST EXCISION  2011  . lipoma removal    . right bunioectomy    . SHOULDER SURGERY  01/13/2011   right, partial tear  . tumor resection left thigh      There were no vitals filed for this visit.  Subjective Assessment - 02/03/19 1535    Subjective  Patient reports that she continues to have the migraine, she reports that the last treatment that I did helped for a few hours, she is unsure of why the HA has lasted    Currently in Pain?  Yes    Pain Score  6     Pain Location  Hand    Pain Relieving Factors  the last treatment helped                        [facility] Adult PT Treatment/Exercise - 02/03/19 0001      Shoulder Exercises: ROM/Strengthening   UBE (Upper Arm Bike)  level 5 x 6 minutes    Lat Pull  2 plate;15 reps    Lat Pull Limitations  2 sets    Cybex Press  1 plate;15 reps    Cybex Row  15 reps;2 plate    Cybex Row Limitations  2 sets      Shoulder Exercises: IT sales professional  3 reps;10 seconds      Acupuncturist Location  C/T area    Printmaker Action  IFC    Electrical Stimulation Parameters  supine    Electrical Stimulation Goals  Pain      Manual Therapy   Manual Therapy  Soft tissue mobilization    Soft tissue mobilization  upper traps and the cervical paraspinals, gentle occipital release fo rher HA  PT Long Term Goals - 02/03/19 1613      PT LONG TERM GOAL #1   Title  decrease pain in the left shoulder 50%    Status  Achieved      PT LONG TERM GOAL #2   Title  report able to dress and do hair without difficulty    Status  Achieved            Plan - 02/03/19 1611    Clinical Impression Statement  Patient continues to have the migraine that started Christmas eve.  She is awaiting her neurologist to call.  She is pretty tense with spasms and knots in the upper traps and into the neck, the last treatment wehre I focused on this she reports was very helpful so we did this again, she does report work is going well and she is tolerating that well with some difficulty from the shoulder    PT Next Visit Plan  see how the spasms and the migraine is, return to exercise and work simulation if tolerated    Consulted and Agree with Plan of Care  Patient       Patient will benefit from skilled therapeutic intervention in order to improve the following deficits and impairments:  Pain, Improper body mechanics, Increased muscle spasms, Postural dysfunction, Impaired UE functional use, Decreased strength,  Decreased range of motion  Visit Diagnosis: Acute pain of left shoulder  Stiffness of left shoulder, not elsewhere classified  Cervicalgia  Cramp and spasm     Problem List Patient Active Problem List   Diagnosis Date Noted  . 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 Genetic testing 02/27/2018  . Family history of genetic disease carrier   . Family history of breast cancer   . Family history of colon cancer   . Head injury with loss of consciousness ([place]) 11/20/2017  . Nausea 11/20/2017  . 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 Tremor observed on examination 11/20/2017  . Left shoulder pain 02/18/2017  . MDD (major depressive disorder), recurrent severe, without psychosis ([place]) 12/28/2016  . Muscle strain 09/19/2016  . Right foot injury, subsequent encounter 08/25/2016  . Low back pain 06/27/2016  . Left wrist injury, subsequent encounter 06/27/2016  . Pain of left thumb 10/07/2015  . 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 Strain of left thumb 02/11/2015  . Strain of right forearm 02/11/2015  . Right shoulder pain 12/31/2014  . Injury of right little finger 12/31/2014  . Episodic cluster headache, not intractable 12/02/2014  . 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 Hypersomnia, recurrent 12/02/2014  . Migraine aura, persistent, intractable, with status migrainosus 12/02/2014  . Lower back injury 09/30/2014  . Bipolar I disorder, most recent episode depressed ([place])   . MDD (major depressive disorder), recurrent, severe, with psychosis ([place]) 09/23/2014  . Injury of left index finger 08/20/2014  . Right ankle sprain 06/01/2014  . Contusion, multiple sites 06/01/2014  . Strain of right gastrocnemius muscle 06/01/2014  . Phonophobia 05/04/2014  . Photophobia of both eyes 05/04/2014  .  Emotionally unstable borderline personality disorder ([place]) 05/04/2014  . Nausea with vomiting 05/04/2014  . Mixed bipolar I disorder ([place])   . Bipolar I disorder, most recent episode mixed ([place]) 04/18/2014  . Bipolar affective disorder, depressed, mild ([place]) 04/12/2014  . Suicidal ideation 04/12/2014  . Injury of right  shoulder and upper arm 02/17/2014  . Left leg pain 06/03/2013  . Right hip pain 06/03/2013  . Migraine with status migrainosus 01/08/2013  . Personality disorder ([place])   . Chronic migraine 05/08/2012  . Contact dermatitis 11/27/2011  . Major depressive disorder, recurrent episode ([place]) 10/27/2011  . Generalized anxiety disorder 10/27/2011  . 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 Borderline personality disorder ([place]) 10/27/2011  . Right foot pain 09/28/2011  . 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 Ganglion cyst 09/29/2009  . PES PLANUS 07/01/2008  . BIPOLAR DISORDER UNSPECIFIED 06/09/2008    Sumner Boast., PT 02/03/2019, 4:14 PM  [address] [place], [place], [address] Phone: [phone]   Fax:  [phone]  Name: [name] MRN: [id number] Date of Birth: Sep 10, 1970

## 2019-02-03 NOTE — Telephone Encounter (Signed)
Has she had prednisone dosepak before? If so and she tolerated it well I can offer her this.

## 2019-02-03 NOTE — Telephone Encounter (Signed)
She is ok to have this toradol.

## 2019-02-03 NOTE — Telephone Encounter (Signed)
The only other than I can offer is toradol injection since she just had depakote infusion

## 2019-02-03 NOTE — Telephone Encounter (Signed)
I called pt and she stated she has had prednisone, not for headaches/migraines.  She states could make her manic work and that is not a good thing, she would prefer not to do.  Please advise.

## 2019-02-03 NOTE — Telephone Encounter (Signed)
I called pt back.  She states she has had a migraines since christmas eve.  It is relieved some, but not totally with sumatriptan (3-4 hours) then will come back.  She is level 4 right now when worse up to level 8 pounding.   Has nausea that phenergan has helped.    Asking what else to do.  Another depacon infusion? (this helped for 2 days last time).  Please advise.  Is taking her preventative.

## 2019-02-04 ENCOUNTER — Ambulatory Visit (INDEPENDENT_AMBULATORY_CARE_PROVIDER_SITE_OTHER): Payer: PPO | Admitting: *Deleted

## 2019-02-04 DIAGNOSIS — G43011 Migraine without aura, intractable, with status migrainosus: Secondary | ICD-10-CM

## 2019-02-04 MED ORDER — KETOROLAC TROMETHAMINE 60 MG/2ML IM SOLN
60.0000 mg | Freq: Once | INTRAMUSCULAR | Status: AC
  Administered 2019-02-04: 60 mg via INTRAMUSCULAR

## 2019-02-04 NOTE — Progress Notes (Signed)
KetorolacTromethamine (single use Vial) 6mg  per 50mL. (30 mg per mL)  [facility] [id number] F Lot: [id number] Exp: 06/22  Explained to the patient that in low does such as this single dose injection she will be okay.   However, I explained that she must NOT take an NSaids including OTC in saids including but not limited to: Aspirin, Tylenol, Aleeve/naproxen, ibuprofen etc...  Pt demonstrated understanding.  Pt states that she has had a Migraine that has lasted since the holidays.  No bleeding at injection site.  Pt tolerated well. States she's had a Toradol shot before.

## 2019-02-04 NOTE — Telephone Encounter (Signed)
Late entry: spoke to RN to give 30 mg IM. Advised CMA to let patient know to avoid all other NSAIDS for the day. Last creatinine was 1.17.

## 2019-02-05 NOTE — Progress Notes (Signed)
See [name] CMA note

## 2019-02-06 ENCOUNTER — Ambulatory Visit: Payer: PPO | Admitting: Physical Therapy

## 2019-02-06 ENCOUNTER — Other Ambulatory Visit: Payer: Self-pay

## 2019-02-06 ENCOUNTER — Encounter: Payer: Self-pay | Admitting: Physical Therapy

## 2019-02-06 DIAGNOSIS — M25512 Pain in left shoulder: Secondary | ICD-10-CM

## 2019-02-06 DIAGNOSIS — M542 Cervicalgia: Secondary | ICD-10-CM

## 2019-02-06 DIAGNOSIS — M25612 Stiffness of left shoulder, not elsewhere classified: Secondary | ICD-10-CM

## 2019-02-06 DIAGNOSIS — R252 Cramp and spasm: Secondary | ICD-10-CM

## 2019-02-06 NOTE — Therapy (Signed)
[place], [place], [address] Phone: [phone]   Fax:  [phone]  Physical Therapy Treatment  Patient Details  Name: [name] MRN: [id number] Date of Birth: 11/06/1970 Referring Provider (PT): [place], [place]   Encounter Date: 02/06/2019  PT End of Session - 02/06/19 1611    Visit Number  24    Date for PT Re-Evaluation  02/15/19    PT Start Time  1530    PT Stop Time  1624    PT Time Calculation (min)  54 min    Activity Tolerance  Patient limited by pain    Behavior During Therapy  [facility] for tasks assessed/performed       Past Medical History:  Diagnosis Date  . Achilles tendinitis   . Achilles tendinitis   . ADHD (attention deficit hyperactivity disorder)   . Allergy   . Arthritis   . Bipolar affective ([place])   . Bipolar disorder ([place])   . Cataracts, bilateral   . Eczema   . Family history of breast cancer   . Family history of colon cancer   . Family history of genetic disease carrier   . Ganglion cyst 09/29/2009   left wrist (2 cyst)  . Hyperprolactinemia ([place])   . Hypertension   . Lipoma   . Migraine   . Personality disorder ([place])   . Pes planus     Past Surgical History:  Procedure Laterality Date  . ANKLE SURGERY  12/88   left   . chest nodule  1990?   rt chest wall nodule removal  . GANGLION CYST EXCISION  2011  . lipoma removal    . right bunioectomy    . SHOULDER SURGERY  01/13/2011   right, partial tear  . tumor resection left thigh      There were no vitals filed for this visit.  Subjective Assessment - 02/06/19 1534    Subjective  Patient reports that she has been very busy at work.  reports that the massage and the shot of Toradol really helped    Currently in Pain?  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                        [name] Adult PT Treatment/Exercise - 02/06/19 0001      Shoulder Exercises: Seated   Extension  Left;20 reps    Extension Weight (lbs)  3    Extension Limitations  bent over extension    Row  Left;20 reps;Weights    Row Weight (lbs)  3    Row Limitations  bent over row    Horizontal ABduction  Both;20 reps;Weights    Horizontal ABduction Weight (lbs)  2    Horizontal ABduction Limitations  bent over       Shoulder Exercises: Standing   Horizontal ABduction  Strengthening;Both;20 reps;Theraband    Theraband Level (Shoulder Horizontal ABduction)  Level 3 (Green)    External Rotation  20 reps;Theraband;Left    Theraband Level (Shoulder External Rotation)  Level 3 (Green)    Internal Rotation  Left;20 reps;Theraband    Theraband Level (Shoulder Internal Rotation)  Level 3 (Green)      Shoulder Exercises: ROM/Strengthening   UBE (  Upper Arm Bike)  level 5 x 6 minutes    Lat Pull  2 plate;25 reps    Cybex Press  1 plate;25 reps    Cybex Row  25 reps;2 plate      Acupuncturist Location  C/T area    Printmaker Action  IFC    Electrical Stimulation Parameters  supine    Electrical Stimulation Goals  Pain      Manual Therapy   Manual Therapy  Soft tissue mobilization    Soft tissue mobilization  upper traps and the cervical paraspinals, gentle occipital release fo rher HA                   PT Long Term Goals - 02/06/19 1619      PT LONG TERM GOAL #1   Title  decrease pain in the left shoulder 50%    Status  Achieved      PT LONG TERM GOAL #4   Title  increase left shoulder IR to 65 degrees    Status  Achieved            Plan - 02/06/19 1611    Clinical Impression Statement  Patient doing better but still very tight with knots in the upper traps.  Increased some of the resistance today, seemed to tolerate without difficulty.    PT Next Visit Plan  continue a few more visits to  work on her function    Consulted and Agree with Plan of Care  Patient       Patient will benefit from skilled therapeutic intervention in order to improve the following deficits and impairments:  Pain, Improper body mechanics, Increased muscle spasms, Postural dysfunction, Impaired UE functional use, Decreased strength, Decreased range of motion  Visit Diagnosis: Acute pain of left shoulder  Stiffness of left shoulder, not elsewhere classified  Cervicalgia  Cramp and spasm     Problem List Patient Active Problem List   Diagnosis Date Noted  . 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 Genetic testing 02/27/2018  . Family history of genetic disease carrier   . Family history of breast cancer   . Family history of colon cancer   . Head injury with loss of consciousness ([place]) 11/20/2017  . Nausea 11/20/2017  . 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 Tremor observed on examination 11/20/2017  . Left shoulder pain 02/18/2017  . MDD (major depressive disorder), recurrent severe, without psychosis ([place]) 12/28/2016  . Muscle strain 09/19/2016  . Right foot injury, subsequent encounter 08/25/2016  . Low back pain 06/27/2016  . Left wrist injury, subsequent encounter 06/27/2016  . Pain of left thumb 10/07/2015  . 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 Strain of left thumb 02/11/2015  . Strain of right forearm 02/11/2015  . Right shoulder pain 12/31/2014  . Injury of right little finger 12/31/2014  . Episodic cluster headache, not intractable 12/02/2014  . 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 Hypersomnia, recurrent 12/02/2014  . Migraine aura, persistent, intractable, with status migrainosus 12/02/2014  . Lower back injury 09/30/2014  . Bipolar I disorder, most recent episode depressed ([place])   .  MDD (major depressive disorder), recurrent, severe, with psychosis ([place]) 09/23/2014  . Injury of left index finger 08/20/2014  . Right ankle sprain 06/01/2014  . Contusion, multiple sites 06/01/2014  .  Strain of right gastrocnemius muscle 06/01/2014  . Phonophobia 05/04/2014  . Photophobia of both eyes 05/04/2014  . Emotionally unstable borderline personality disorder ([place]) 05/04/2014  . Nausea with vomiting 05/04/2014  . Mixed bipolar I disorder ([place])   . Bipolar I disorder, most recent episode mixed ([place]) 04/18/2014  . Bipolar affective disorder, depressed, mild ([place]) 04/12/2014  . Suicidal ideation 04/12/2014  . Injury of right shoulder and upper arm 02/17/2014  . Left leg pain 06/03/2013  . Right hip pain 06/03/2013  . Migraine with status migrainosus 01/08/2013  . Personality disorder ([place])   . Chronic migraine 05/08/2012  . Contact dermatitis 11/27/2011  . Major depressive disorder, recurrent episode ([place]) 10/27/2011  . Generalized anxiety disorder 10/27/2011  . 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 Borderline personality disorder ([place]) 10/27/2011  . Right foot pain 09/28/2011  . 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 Ganglion cyst 09/29/2009  . PES PLANUS 07/01/2008  . BIPOLAR DISORDER UNSPECIFIED 06/09/2008    Sumner Boast., PT 02/06/2019, 4:20 PM  [place] [address] [place], [place], [address] Phone: [phone]   Fax:  [phone]  Name: [name] MRN: [id number] Date of Birth: Nov 24, 1970

## 2019-02-10 ENCOUNTER — Ambulatory Visit: Payer: PPO | Admitting: Physical Therapy

## 2019-02-10 ENCOUNTER — Other Ambulatory Visit: Payer: Self-pay

## 2019-02-10 ENCOUNTER — Encounter: Payer: Self-pay | Admitting: Physical Therapy

## 2019-02-10 DIAGNOSIS — M542 Cervicalgia: Secondary | ICD-10-CM

## 2019-02-10 DIAGNOSIS — M25612 Stiffness of left shoulder, not elsewhere classified: Secondary | ICD-10-CM

## 2019-02-10 DIAGNOSIS — M25512 Pain in left shoulder: Secondary | ICD-10-CM

## 2019-02-10 NOTE — Therapy (Signed)
[place], [place], [address] Phone: [phone]   Fax:  [phone]  Physical Therapy Treatment  Patient Details  Name: [name] MRN: [id number] Date of Birth: 07/04/1970 Referring Provider (PT): [place], [place]   Encounter Date: 02/10/2019  PT End of Session - 02/10/19 1056    Visit Number  25    Date for PT Re-Evaluation  02/15/19    PT Start Time  1020    PT Stop Time  1115    PT Time Calculation (min)  55 min    Activity Tolerance  Patient tolerated treatment well    Behavior During Therapy  [facility] for tasks assessed/performed       Past Medical History:  Diagnosis Date  . Achilles tendinitis   . Achilles tendinitis   . ADHD (attention deficit hyperactivity disorder)   . Allergy   . Arthritis   . Bipolar affective ([place])   . Bipolar disorder ([place])   . Cataracts, bilateral   . Eczema   . Family history of breast cancer   . Family history of colon cancer   . Family history of genetic disease carrier   . Ganglion cyst 09/29/2009   left wrist (2 cyst)  . Hyperprolactinemia ([place])   . Hypertension   . Lipoma   . Migraine   . Personality disorder ([place])   . Pes planus     Past Surgical History:  Procedure Laterality Date  . ANKLE SURGERY  12/88   left   . chest nodule  1990?   rt chest wall nodule removal  . GANGLION CYST EXCISION  2011  . lipoma removal    . right bunioectomy    . SHOULDER SURGERY  01/13/2011   right, partial tear  . tumor resection left thigh      There were no vitals filed for this visit.  Subjective Assessment - 02/10/19 1021    Subjective  "They worked me a lot of days in a row" Today feeling pretty good    Currently in Pain?  Yes    Pain Score  3     Pain Location  Shoulder    Pain Orientation  Left                       [facility] Adult PT Treatment/Exercise - 02/10/19 0001      Shoulder Exercises: Standing   Horizontal  ABduction  Strengthening;Both;20 reps;Theraband    Theraband Level (Shoulder Horizontal ABduction)  Level 3 (Green)    External Rotation  20 reps;Theraband;Left    Theraband Level (Shoulder External Rotation)  Level 3 (Green)    Internal Rotation  Left;20 reps;Theraband    Theraband Level (Shoulder Internal Rotation)  Level 3 (Green)      Shoulder Exercises: ROM/Strengthening   UBE (Upper Arm Bike)  level 5 x 6 minutes    Lat Pull  2 plate;15 reps   x2   Cybex Press  1 plate;25 reps    Cybex Row  2 plate    Cybex Row Limitations  2x15      Moist Heat Therapy   Number Minutes Moist Heat  15 Minutes    Moist Heat Location  Shoulder      Electrical Stimulation   Electrical Stimulation Location  C/T area    Electrical Stimulation Action  IFC    Electrical Stimulation Parameters  supine    Electrical Stimulation Goals  Pain  Manual Therapy   Manual Therapy  Soft tissue mobilization    Soft tissue mobilization  upper traps and the cervical paraspinals, gentle occipital release fo rher HA                   PT Long Term Goals - 02/06/19 1619      PT LONG TERM GOAL #1   Title  decrease pain in the left shoulder 50%    Status  Achieved      PT LONG TERM GOAL #4   Title  increase left shoulder IR to 65 degrees    Status  Achieved            Plan - 02/10/19 1058    Clinical Impression Statement  Pt reports improvement from previous treatment. Some tightness noted in upper traps. Cues to keep shoulder down needed with seated rows. Positive response to MT.    Stability/Clinical Decision Making  Evolving/Moderate complexity    Rehab Potential  Good    PT Frequency  2x / week    PT Treatment/Interventions  ADLs/Self Care Home Management;Cryotherapy;Electrical Stimulation;Moist Heat;Ultrasound;Therapeutic activities;Therapeutic exercise;Manual techniques;Patient/family education    PT Next Visit Plan  continue a few more visits to work on her function        Patient will benefit from skilled therapeutic intervention in order to improve the following deficits and impairments:  Pain, Improper body mechanics, Increased muscle spasms, Postural dysfunction, Impaired UE functional use, Decreased strength, Decreased range of motion  Visit Diagnosis: Stiffness of left shoulder, not elsewhere classified  Acute pain of left shoulder  Cervicalgia     Problem List Patient Active Problem List   Diagnosis Date Noted  . 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 Genetic testing 02/27/2018  . Family history of genetic disease carrier   . Family history of breast cancer   . Family history of colon cancer   . Head injury with loss of consciousness ([place]) 11/20/2017  . Nausea 11/20/2017  . 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 Tremor observed on examination 11/20/2017  . Left shoulder pain 02/18/2017  . MDD (major depressive disorder), recurrent severe, without psychosis ([place]) 12/28/2016  . Muscle strain 09/19/2016  . Right foot injury, subsequent encounter 08/25/2016  . Low back pain 06/27/2016  . Left wrist injury, subsequent encounter 06/27/2016  . Pain of left thumb 10/07/2015  . 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 Strain of left thumb 02/11/2015  . Strain of right forearm 02/11/2015  . Right shoulder pain 12/31/2014  . Injury of right little finger 12/31/2014  . Episodic cluster headache, not intractable 12/02/2014  . 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 Hypersomnia, recurrent 12/02/2014  . Migraine aura, persistent, intractable, with status migrainosus 12/02/2014  . Lower back injury 09/30/2014  . Bipolar I disorder, most recent episode depressed ([place])   . MDD (major depressive disorder), recurrent, severe, with psychosis ([place]) 09/23/2014  . Injury  of left index finger 08/20/2014  . Right ankle sprain 06/01/2014  . Contusion, multiple sites 06/01/2014  . Strain of right gastrocnemius muscle 06/01/2014  . Phonophobia 05/04/2014  . Photophobia of both eyes 05/04/2014  . Emotionally unstable borderline personality disorder ([place]) 05/04/2014  . Nausea with vomiting 05/04/2014  . Mixed bipolar I disorder ([place])   . Bipolar I disorder, most recent episode mixed ([place]) 04/18/2014  .  Bipolar affective disorder, depressed, mild ([place]) 04/12/2014  . Suicidal ideation 04/12/2014  . Injury of right shoulder and upper arm 02/17/2014  . Left leg pain 06/03/2013  . Right hip pain 06/03/2013  . Migraine with status migrainosus 01/08/2013  . Personality disorder ([place])   . Chronic migraine 05/08/2012  . Contact dermatitis 11/27/2011  . Major depressive disorder, recurrent episode ([place]) 10/27/2011  . Generalized anxiety disorder 10/27/2011  . 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 Borderline personality disorder ([place]) 10/27/2011  . Right foot pain 09/28/2011  . 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 Ganglion cyst 09/29/2009  . PES PLANUS 07/01/2008  . BIPOLAR DISORDER UNSPECIFIED 06/09/2008    Scot Jun, PTA 02/10/2019, 11:00 AM  [place], [place], [address] Phone: [phone]   Fax:  [phone]  Name: [name] MRN: [id number] Date of Birth: 08-03-1970

## 2019-02-13 ENCOUNTER — Encounter: Payer: Self-pay | Admitting: Physical Therapy

## 2019-02-13 ENCOUNTER — Ambulatory Visit: Payer: PPO | Admitting: Physical Therapy

## 2019-02-13 ENCOUNTER — Other Ambulatory Visit: Payer: Self-pay

## 2019-02-13 DIAGNOSIS — M542 Cervicalgia: Secondary | ICD-10-CM

## 2019-02-13 DIAGNOSIS — M25512 Pain in left shoulder: Secondary | ICD-10-CM

## 2019-02-13 DIAGNOSIS — M25612 Stiffness of left shoulder, not elsewhere classified: Secondary | ICD-10-CM

## 2019-02-13 DIAGNOSIS — R252 Cramp and spasm: Secondary | ICD-10-CM

## 2019-02-13 NOTE — Therapy (Signed)
[place], [place], [address] Phone: [phone]   Fax:  [phone]  Physical Therapy Treatment  Patient Details  Name: [name] MRN: [id number] Date of Birth: 28-Feb-1970 Referring Provider (PT): [place], [place]   Encounter Date: 02/13/2019  PT End of Session - 02/13/19 1100    Visit Number  26    Date for PT Re-Evaluation  02/15/19    PT Start Time  1012    PT Stop Time  1110    PT Time Calculation (min)  58 min    Activity Tolerance  Patient tolerated treatment well    Behavior During Therapy  [facility] for tasks assessed/performed       Past Medical History:  Diagnosis Date  . Achilles tendinitis   . Achilles tendinitis   . ADHD (attention deficit hyperactivity disorder)   . Allergy   . Arthritis   . Bipolar affective ([place])   . Bipolar disorder ([place])   . Cataracts, bilateral   . Eczema   . Family history of breast cancer   . Family history of colon cancer   . Family history of genetic disease carrier   . Ganglion cyst 09/29/2009   left wrist (2 cyst)  . Hyperprolactinemia ([place])   . Hypertension   . Lipoma   . Migraine   . Personality disorder ([place])   . Pes planus     Past Surgical History:  Procedure Laterality Date  . ANKLE SURGERY  12/88   left   . chest nodule  1990?   rt chest wall nodule removal  . GANGLION CYST EXCISION  2011  . lipoma removal    . right bunioectomy    . SHOULDER SURGERY  01/13/2011   right, partial tear  . tumor resection left thigh      There were no vitals filed for this visit.  Subjective Assessment - 02/13/19 1013    Subjective  I think I am doing better    Currently in Pain?  Yes    Pain Score  2     Pain Location  Shoulder    Pain Orientation  Left    Aggravating Factors   reaching up and lifting bags         [facility] PT Assessment - 02/13/19 0001      AROM   Left Shoulder Flexion  140 Degrees    Left Shoulder ABduction   110 Degrees    Left Shoulder Internal Rotation  65 Degrees    Left Shoulder External Rotation  90 Degrees      Strength   Overall Strength Comments  strength is 4/5 with some pain in the available ROM                   [facility] Adult PT Treatment/Exercise - 02/13/19 0001      Shoulder Exercises: Seated   Extension  Left;20 reps    Extension Weight (lbs)  3    Extension Limitations  bent over extension    Row  Left;20 reps;Weights    Row Weight (lbs)  3    Row Limitations  bent over row    Horizontal ABduction  Both;20 reps;Weights    Horizontal ABduction Weight (lbs)  2    Horizontal ABduction Limitations  bent over       Shoulder Exercises: Standing   Horizontal ABduction  Strengthening;Both;20 reps;Theraband    Theraband Level (Shoulder Horizontal ABduction)  Level 3 (  Green)    External Rotation  20 reps;Theraband;Left    Theraband Level (Shoulder External Rotation)  Level 3 (Green)    Internal Rotation  Left;20 reps;Theraband    Theraband Level (Shoulder Internal Rotation)  Level 3 (Green)      Shoulder Exercises: ROM/Strengthening   UBE (Upper Arm Bike)  level 5 x 6 minutes    Lat Pull  2 plate    Lat Pull Limitations  2x15    Cybex Row  2 plate    Cybex Row Limitations  2x15      Moist Heat Therapy   Number Minutes Moist Heat  10 Minutes    Moist Heat Location  Shoulder      Electrical Stimulation   Electrical Stimulation Location  left upper trap area    Electrical Stimulation Action  IFC    Electrical Stimulation Parameters  supine    Electrical Stimulation Goals  Pain      Manual Therapy   Manual Therapy  Soft tissue mobilization    Soft tissue mobilization  upper traps and the cervical paraspinals, gentle occipital release fo rher HA                   PT Long Term Goals - 02/13/19 1103      PT LONG TERM GOAL #1   Title  decrease pain in the left shoulder 50%    Status  Achieved      PT LONG TERM GOAL #2   Title  report able to  dress and do hair without difficulty    Status  Achieved      PT LONG TERM GOAL #3   Title  increase left shoulder AROM for flexion to 140 degrees    Status  Partially Met      PT LONG TERM GOAL #4   Title  increase left shoulder IR to 65 degrees    Status  Achieved            Plan - 02/13/19 1101    Clinical Impression Statement  Patient overall is doing well, she is back at work her normal hours and tolerating well, she has learned some ways to decrease the stress on the shoulder as she still is weak and has pain with reaching out and lifting away from the body as well as over shoiulder height, she made very good incresaes in ROM but still has some limitations.  She had a setback with a migraine that lasted 2 weeks, she has tension and knots in the upper traps and into the cervical area    PT Next Visit Plan  I feel like we could D/C and she do some HEP    Consulted and Agree with Plan of Care  Patient       Patient will benefit from skilled therapeutic intervention in order to improve the following deficits and impairments:  Pain, Improper body mechanics, Increased muscle spasms, Postural dysfunction, Impaired UE functional use, Decreased strength, Decreased range of motion  Visit Diagnosis: Stiffness of left shoulder, not elsewhere classified  Acute pain of left shoulder  Cervicalgia  Cramp and spasm     Problem List Patient Active Problem List   Diagnosis Date Noted  . 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 Genetic testing 02/27/2018  . Family history of genetic disease carrier   . Family history of breast cancer   . Family history of colon cancer   . Head injury with loss  of consciousness ([place]) 11/20/2017  . Nausea 11/20/2017  . 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 Tremor observed on examination 11/20/2017  . Left shoulder pain 02/18/2017  .  MDD (major depressive disorder), recurrent severe, without psychosis ([place]) 12/28/2016  . Muscle strain 09/19/2016  . Right foot injury, subsequent encounter 08/25/2016  . Low back pain 06/27/2016  . Left wrist injury, subsequent encounter 06/27/2016  . Pain of left thumb 10/07/2015  . 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 Strain of left thumb 02/11/2015  . Strain of right forearm 02/11/2015  . Right shoulder pain 12/31/2014  . Injury of right little finger 12/31/2014  . Episodic cluster headache, not intractable 12/02/2014  . 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 Hypersomnia, recurrent 12/02/2014  . Migraine aura, persistent, intractable, with status migrainosus 12/02/2014  . Lower back injury 09/30/2014  . Bipolar I disorder, most recent episode depressed ([place])   . MDD (major depressive disorder), recurrent, severe, with psychosis ([place]) 09/23/2014  . Injury of left index finger 08/20/2014  . Right ankle sprain 06/01/2014  . Contusion, multiple sites 06/01/2014  . Strain of right gastrocnemius muscle 06/01/2014  . Phonophobia 05/04/2014  . Photophobia of both eyes 05/04/2014  . Emotionally unstable borderline personality disorder ([place]) 05/04/2014  . Nausea with vomiting 05/04/2014  . Mixed bipolar I disorder ([place])   . Bipolar I disorder, most recent episode mixed ([place]) 04/18/2014  . Bipolar affective disorder, depressed, mild ([place]) 04/12/2014  . Suicidal ideation 04/12/2014  . Injury of right shoulder and upper arm 02/17/2014  . Left leg pain 06/03/2013  . Right hip pain 06/03/2013  . Migraine with status migrainosus 01/08/2013  . Personality disorder ([place])   . Chronic migraine 05/08/2012  . Contact dermatitis 11/27/2011  . Major depressive disorder, recurrent episode ([place]) 10/27/2011  . Generalized anxiety disorder 10/27/2011  . ADHD (attention deficit hyperactivity disorder), 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   . Borderline personality disorder ([place]) 10/27/2011  . Right foot pain 09/28/2011  . 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 Ganglion cyst 09/29/2009  . PES PLANUS 07/01/2008  . BIPOLAR DISORDER UNSPECIFIED 06/09/2008    Sumner Boast., PT 02/13/2019, 11:04 AM  [address] [address] [place], [place], [address] Phone: [phone]   Fax:  [phone]  Name: [name] MRN: [id number] Date of Birth: 1970/01/31

## 2019-02-17 ENCOUNTER — Encounter: Payer: Self-pay | Admitting: Adult Health

## 2019-02-17 ENCOUNTER — Other Ambulatory Visit: Payer: Self-pay

## 2019-02-17 ENCOUNTER — Ambulatory Visit: Payer: PPO | Admitting: Adult Health

## 2019-02-17 VITALS — BP 118/86 | HR 64 | Temp 97.3°F | Ht 70.0 in | Wt 254.4 lb

## 2019-02-17 DIAGNOSIS — G43009 Migraine without aura, not intractable, without status migrainosus: Secondary | ICD-10-CM

## 2019-02-17 DIAGNOSIS — F0781 Postconcussional syndrome: Secondary | ICD-10-CM | POA: Diagnosis not present

## 2019-02-17 DIAGNOSIS — N289 Disorder of kidney and ureter, unspecified: Secondary | ICD-10-CM | POA: Diagnosis not present

## 2019-02-17 DIAGNOSIS — M25512 Pain in left shoulder: Secondary | ICD-10-CM | POA: Diagnosis not present

## 2019-02-17 NOTE — Progress Notes (Signed)
PATIENT: [name] DOB: 03/06/1970  REASON FOR VISIT: follow up HISTORY FROM: patient  HISTORY OF PRESENT ILLNESS: Today 02/17/19:  [name] is a 49 year old female with a history of postconcussive syndrome and migraine headaches.  She returns today for follow-up.  The patient reports that Toradol injection resolved her headache.  She has not had a headache since then.  She continues on Depakote and Emgality.  Her next injection is February 10 for [employer].  She continues to have chronic dizziness.  She also reports some memory issues but is unsure if it is just "brain fog" she states on occasion she will also drop things but this has been ongoing.  The patient had a CT of the head back in 2019 after hitting her head that was unremarkable.  She returns today for an evaluation.  HISTORY 01/01/19:  [name] is a 49 year old female with a history of postconcussive syndrome and migraine headaches.  She returns today for follow-up.  She states that she hit her head on the freezer door and has had a migraine since then.  She states that she bent over to get something out of the refrigerator and when she stood up she hit the top of her head on the freezer door.  She states that she has had her typical migraine symptoms.  The migraine is left-sided.  She states that she is a little more dizzier than normal.  No vision changes.  No weakness in the arms or legs.  She states that she continues to have trouble with her memory.  She reports being forgetful with appointments.  She is able to complete all ADLs independently.  She operates a Teacher, music without difficulty.  In the past Depacon infusion has been beneficial for her migraines.  REVIEW OF SYSTEMS: Out of a complete 14 system review of symptoms, the patient complains only of the following symptoms, and all other reviewed systems are negative.  See HPI  ALLERGIES: Allergies  Allergen Reactions  . Adhesive [Tape] Itching and Rash      Also reacted to Steri Strips and Band-Aids.  . Dilaudid [Hydromorphone Hcl] Itching  . Morphine Nausea And Vomiting  . Penicillins Hives    Has patient had a PCN reaction causing immediate rash, facial/tongue/throat swelling, SOB or lightheadedness with hypotension: YES Has patient had a PCN reaction causing severe rash involving mucus membranes or skin necrosis: NO Has patient had a PCN reaction that required hospitalization NO Has patient had a PCN reaction occurring within the last 10 years:NO If all of the above answers are "NO", then may proceed with Cephalosporin use.  Marland Kitchen Percocet [Oxycodone-Acetaminophen] Itching  . Prednisone Hives  . Provera [Medroxyprogesterone Acetate] Other (See Comments)    Causes manic episodes  . Ultram [Tramadol Hcl] Itching    HOME MEDICATIONS: Outpatient Medications Prior to Visit  Medication Sig Dispense Refill  . clobetasol cream (TEMOVATE) 0.05 %   3  . divalproex (DEPAKOTE ER) 500 MG 24 hr tablet TAKE 2 TABLETS BY MOUTH AT BEDTIME FOR MOOD CONTROL 60 tablet 5  . Galcanezumab-gnlm (EMGALITY) 120 MG/ML SOAJ Inject 120 mg into the skin every 30 (thirty) days. 1 mL 11  . hydrOXYzine (VISTARIL) 50 MG capsule TK ONE C PO TID PRA  2  . lamoTRIgine (LAMICTAL) 150 MG tablet Take 300 mg by mouth at bedtime.  1  . levocetirizine (XYZAL) 5 MG tablet Take 5 mg by mouth every evening.  1  . lurasidone (LATUDA) 20 MG  TABS tablet Take by mouth. Takes in the morning.    . lurasidone (LATUDA) 80 MG TABS tablet Take 1 tablet (80 mg total) by mouth daily with supper. For mood control 30 tablet 0  . metoprolol succinate (TOPROL-XL) 100 MG 24 hr tablet Take 1 tablet (100 mg total) by mouth daily. Take with or immediately following a meal. 30 tablet 0  . promethazine (PHENERGAN) 25 MG tablet TK 1 T PO BID FOR 15 DAYS PRN 30 tablet 0  . SUMAtriptan Succinate Refill 6 MG/0.5ML SOCT Inject 0.5 ml SQ at the onset of migraine. May repeat in 2 hours if needed. 0.5 mL 5  .  traZODone (DESYREL) 50 MG tablet Take 1 tablet (50 mg total) by mouth at bedtime as needed for sleep. (Patient taking differently: Take 100 mg by mouth. ) 30 tablet 0  . diclofenac (VOLTAREN) 75 MG EC tablet Take 75 mg by mouth 2 (two) times daily.    Marland Kitchen EMGALITY 120 MG/ML SOSY INJECT120 MG INTO THE SKINEVERY 30 DAYS (Patient not taking: Reported on 02/17/2019) 1 mL 11  . meloxicam (MOBIC) 15 MG tablet TK 1 T PO QD     Facility-Administered Medications Prior to Visit  Medication Dose Route Frequency Provider Last Rate Last Admin  . methylPREDNISolone acetate (DEPO-MEDROL) injection 40 mg  40 mg Intra-articular Once Hudnall, Sharyn Lull, MD      . valproate (DEPACON) 1,000 mg in dextrose 5 % 50 mL IVPB  1,000 mg Intravenous Once Dohmeier, Asencion Partridge, MD        PAST MEDICAL HISTORY: Past Medical History:  Diagnosis Date  . Achilles tendinitis   . Achilles tendinitis   . ADHD (attention deficit hyperactivity disorder)   . Allergy   . Arthritis   . Bipolar affective ([place])   . Bipolar disorder ([place])   . Cataracts, bilateral   . Eczema   . Family history of breast cancer   . Family history of colon cancer   . Family history of genetic disease carrier   . Ganglion cyst 09/29/2009   left wrist (2 cyst)  . Hyperprolactinemia ([place])   . Hypertension   . Lipoma   . Migraine   . Personality disorder ([place])   . Pes planus     PAST SURGICAL HISTORY: Past Surgical History:  Procedure Laterality Date  . ANKLE SURGERY  12/88   left   . chest nodule  1990?   rt chest wall nodule removal  . GANGLION CYST EXCISION  2011  . lipoma removal    . right bunioectomy    . SHOULDER SURGERY  01/13/2011   right, partial tear  . tumor resection left thigh      FAMILY HISTORY: Family History  Problem Relation Age of Onset  . Hypertension Mother   . Hyperlipidemia Mother   . Breast cancer Mother 70       genetic testing- CHEK2 likely pathogenic variant  . Heart attack Father   . Heart disease Father     . Hypertension Father   . Bipolar disorder Father   . Diabetes Paternal Grandfather   . Heart disease Maternal Aunt   . Breast cancer Maternal Aunt 65  . Heart disease Maternal Grandmother   . Colon cancer Maternal Grandmother 66  . Colon cancer Maternal Uncle 75  . Lymphoma Maternal Grandfather 38  . Breast cancer Maternal Aunt 65  . Breast cancer Maternal Aunt 35  . Breast cancer Maternal Aunt   . Bone cancer Cousin 62  .  Cervical cancer Cousin        Genetic testing- 'was positive for a gene' had a mastectomy    SOCIAL HISTORY: Social History   Socioeconomic History  . Marital status: Single    Spouse name: Not on file  . Number of children: 0  . Years of education: Not on file  . Highest education level: Not on file  Occupational History    Employer: [employer]  Tobacco Use  . Smoking status: Never Smoker  . Smokeless tobacco: Never Used  Substance and Sexual Activity  . Alcohol use: No    Alcohol/week: 0.0 standard drinks  . Drug use: No  . Sexual activity: Never    Birth control/protection: Pill  Other Topics Concern  . Not on file  Social History Narrative   Caffeine  2 sodas daily, 1 cup coffee daily.   Social Determinants of Health   Financial Resource Strain:   . Difficulty of Paying Living Expenses: Not on file  Food Insecurity:   . Worried About Charity fundraiser in the Last Year: Not on file  . Ran Out of Food in the Last Year: Not on file  Transportation Needs:   . Lack of Transportation (Medical): Not on file  . Lack of Transportation (Non-Medical): Not on file  Physical Activity:   . Days of Exercise per Week: Not on file  . Minutes of Exercise per Session: Not on file  Stress:   . Feeling of Stress : Not on file  Social Connections:   . Frequency of Communication with Friends and Family: Not on file  . Frequency of Social Gatherings with Friends and Family: Not on file  . Attends Religious Services: Not on file  . Active Member of Clubs or  Organizations: Not on file  . Attends Archivist Meetings: Not on file  . Marital Status: Not on file  Intimate Partner Violence:   . Fear of Current or Ex-Partner: Not on file  . Emotionally Abused: Not on file  . Physically Abused: Not on file  . Sexually Abused: Not on file      PHYSICAL EXAM  Vitals:   02/17/19 1258  BP: 118/86  Pulse: 64  Temp: (!) 97.3 F (36.3 C)  Weight: 254 lb 6.4 oz (115.4 kg)  Height: 5' 10" (1.778 m)   Body mass index is 36.5 kg/m.   MMSE - Mini Mental State Exam 08/05/2018  Orientation to time 5  Orientation to Place 5  Registration 3  Attention/ Calculation 5  Recall 2  Language- name 2 objects 2  Language- repeat 1  Language- follow 3 step command 3  Language- read & follow direction 1  Write a sentence 1  Copy design 1  Copy design-comments Named 7 animals  Total score 29     Generalized: Well developed, in no acute distress   Neurological examination  Mentation: Alert oriented to time, place, history taking. Follows all commands speech and language fluent Cranial nerve II-XII: Pupils were equal round reactive to light. Extraocular movements were full, visual field were full on confrontational test. Facial sensation and strength were normal. Uvula tongue midline. Head turning and shoulder shrug  were normal and symmetric. Motor: The motor testing reveals 5 over 5 strength of all 4 extremities. Good symmetric motor tone is noted throughout.  Sensory: Sensory testing is intact to soft touch on all 4 extremities. No evidence of extinction is noted.  Coordination: Cerebellar testing reveals good finger-nose-finger and heel-to-shin bilaterally.  Gait and station: Gait is normal. Tandem gait is normal. Romberg is negative. No drift is seen.  Reflexes: Deep tendon reflexes are symmetric and normal bilaterally.   DIAGNOSTIC DATA (LABS, IMAGING, TESTING) - I reviewed patient records, labs, notes, testing and imaging myself where  available.  Lab Results  Component Value Date   WBC 7.0 08/05/2018   HGB 14.0 08/05/2018   HCT 40.1 08/05/2018   MCV 90 08/05/2018   PLT 205 08/05/2018      Component Value Date/Time   NA 140 08/05/2018 1435   K 5.3 (H) 08/05/2018 1435   CL 104 08/05/2018 1435   CO2 23 08/05/2018 1435   GLUCOSE 105 (H) 08/05/2018 1435   GLUCOSE 88 12/29/2016 0627   BUN 15 08/05/2018 1435   CREATININE 1.17 (H) 08/05/2018 1435   CREATININE 0.83 09/11/2011 1524   CALCIUM 10.1 08/05/2018 1435   PROT 6.1 08/05/2018 1435   ALBUMIN 4.2 08/05/2018 1435   AST 13 08/05/2018 1435   ALT 9 08/05/2018 1435   ALKPHOS 68 08/05/2018 1435   BILITOT 0.4 08/05/2018 1435   GFRNONAA 55 (L) 08/05/2018 1435   GFRAA 64 08/05/2018 1435   Lab Results  Component Value Date   CHOL 198 12/29/2016   HDL 53 12/29/2016   LDLCALC 118 (H) 12/29/2016   TRIG 136 12/29/2016   CHOLHDL 3.7 12/29/2016   Lab Results  Component Value Date   HGBA1C 5.1 12/29/2016   No results found for: [id number] Lab Results  Component Value Date   TSH 4.288 12/29/2016      ASSESSMENT AND PLAN 49 y.o. year old female  has a past medical history of Achilles tendinitis, Achilles tendinitis, ADHD (attention deficit hyperactivity disorder), Allergy, Arthritis, Bipolar affective ([place]), Bipolar disorder ([place]), Cataracts, bilateral, Eczema, Family history of breast cancer, Family history of colon cancer, Family history of genetic disease carrier, Ganglion cyst (09/29/2009), Hyperprolactinemia ([place]), Hypertension, Lipoma, Migraine, Personality disorder ([place]), and Pes planus. here with:  1.  Migraine headaches 2.  Postconcussive syndrome  The patient's headache responded well to Toradol.  We can use this again in the future if she has an intractable migraine.  She will continue on Emgality and Depakote.  I will check kidney function today.  Advised that if symptoms worsen or she develops new symptoms she should let us know.  She will follow-up  in 6 months or sooner if needed.  I spent 15 minutes with the patient. 50% of this time was spent reviewing plan of care   Ward Givens, MSN, NP-C 02/17/2019, 1:05 PM [facility] [address] [place], [place] [address] [phone]

## 2019-02-17 NOTE — Patient Instructions (Signed)
Your Plan:  Continue Emgality and Depakote Blood work today If your symptoms worsen or you develop new symptoms please let us know.   Thank you for coming to see [place] at [facility]. I hope we have been able to provide you high quality care today.  You may receive a patient satisfaction survey over the next few weeks. We would appreciate your feedback and comments so that we may continue to improve ourselves and the health of our patients.

## 2019-02-18 ENCOUNTER — Telehealth: Payer: Self-pay

## 2019-02-18 LAB — COMPREHENSIVE METABOLIC PANEL
ALT: 9 IU/L (ref 0–32)
AST: 16 IU/L (ref 0–40)
Albumin/Globulin Ratio: 2.9 — ABNORMAL HIGH (ref 1.2–2.2)
Albumin: 4.1 g/dL (ref 3.8–4.8)
Alkaline Phosphatase: 67 IU/L (ref 39–117)
BUN/Creatinine Ratio: 9 (ref 9–23)
BUN: 9 mg/dL (ref 6–24)
Bilirubin Total: 0.3 mg/dL (ref 0.0–1.2)
CO2: 25 mmol/L (ref 20–29)
Calcium: 9.5 mg/dL (ref 8.7–10.2)
Chloride: 101 mmol/L (ref 96–106)
Creatinine, Ser: 1.02 mg/dL — ABNORMAL HIGH (ref 0.57–1.00)
GFR calc Af Amer: 75 mL/min/{1.73_m2} (ref >59)
GFR calc non Af Amer: 65 mL/min/{1.73_m2} (ref >59)
Globulin, Total: 1.4 g/dL — ABNORMAL LOW (ref 1.5–4.5)
Glucose: 78 mg/dL (ref 65–99)
Potassium: 4.9 mmol/L (ref 3.5–5.2)
Sodium: 140 mmol/L (ref 134–144)
Total Protein: 5.5 g/dL — ABNORMAL LOW (ref 6.0–8.5)

## 2019-02-18 NOTE — Telephone Encounter (Signed)
-----   Message from Ward Givens, NP sent at 02/18/2019  2:04 PM EST ----- Creatinine level has improved.  We will continue to monitor

## 2019-02-18 NOTE — Telephone Encounter (Signed)
Unable to get in contact with the patient. LVM letting the patient know that her creatinine level had improved, lab values were included in the voicemail. Office number was provided in case she has any further questions.

## 2019-02-21 ENCOUNTER — Other Ambulatory Visit: Payer: Self-pay | Admitting: Adult Health

## 2019-03-12 ENCOUNTER — Ambulatory Visit (INDEPENDENT_AMBULATORY_CARE_PROVIDER_SITE_OTHER): Payer: PPO | Admitting: Family Medicine

## 2019-03-12 ENCOUNTER — Ambulatory Visit
Admission: RE | Admit: 2019-03-12 | Discharge: 2019-03-12 | Disposition: A | Discharge status: Home / Self Care | Payer: PPO | Source: Ambulatory Visit | Attending: Family Medicine | Admitting: Family Medicine

## 2019-03-12 ENCOUNTER — Encounter: Payer: Self-pay | Admitting: Family Medicine

## 2019-03-12 ENCOUNTER — Other Ambulatory Visit: Payer: Self-pay

## 2019-03-12 VITALS — BP 124/82 | Ht 70.0 in | Wt 253.0 lb

## 2019-03-12 DIAGNOSIS — M25532 Pain in left wrist: Secondary | ICD-10-CM

## 2019-03-12 DIAGNOSIS — M79642 Pain in left hand: Secondary | ICD-10-CM | POA: Diagnosis not present

## 2019-03-12 DIAGNOSIS — S6992XA Unspecified injury of left wrist, hand and finger(s), initial encounter: Secondary | ICD-10-CM | POA: Diagnosis not present

## 2019-03-12 MED ORDER — IBUPROFEN 800 MG PO TABS: 800.0000 mg | ORAL_TABLET | Freq: Three times a day (TID) | ORAL | 1 refills | Status: DC | PRN

## 2019-03-12 NOTE — Patient Instructions (Addendum)
Wear the thumb spica except when washing or icing the area. Ibuprofen 800mg  three times a day with food. Ok to take tylenol 500mg  1-2 tabs three times a day as needed as well. Ice the wrist 15 minutes at a time 3-4 times a day at least. Follow up with me in 2 weeks for reevaluation.

## 2019-03-13 NOTE — Progress Notes (Signed)
PCP: Aretta Nip, MD  Subjective:   HPI: Patient is a 49 y.o. female here for left wrist injury.  Patient reports yesterday she tripped over the stand of a fan and sustained a [place] injury to her left wrist. Immediate pain, some swelling of wrist and into left thumb. Difficulty moving both the wrist and the thumb. Some bruising of her knee as well but this doesn't bother her currently. No skin changes of hand/wrist.  Past Medical History:  Diagnosis Date  . Achilles tendinitis   . Achilles tendinitis   . ADHD (attention deficit hyperactivity disorder)   . Allergy   . Arthritis   . Bipolar affective ([place])   . Bipolar disorder ([place])   . Cataracts, bilateral   . Eczema   . Family history of breast cancer   . Family history of colon cancer   . Family history of genetic disease carrier   . Ganglion cyst 09/29/2009   left wrist (2 cyst)  . Hyperprolactinemia ([place])   . Hypertension   . Lipoma   . Migraine   . Personality disorder ([place])   . Pes planus     Current Outpatient Medications on File Prior to Visit  Medication Sig Dispense Refill  . clobetasol cream (TEMOVATE) 0.05 %   3  . diclofenac (VOLTAREN) 75 MG EC tablet Take 75 mg by mouth 2 (two) times daily.    . divalproex (DEPAKOTE ER) 500 MG 24 hr tablet TAKE 2 TABLETS BY MOUTH AT BEDTIME FOR MOOD 60 tablet 5  . EMGALITY 120 MG/ML SOSY INJECT120 MG INTO THE SKINEVERY 30 DAYS (Patient not taking: Reported on 02/17/2019) 1 mL 11  . Galcanezumab-gnlm (EMGALITY) 120 MG/ML SOAJ Inject 120 mg into the skin every 30 (thirty) days. 1 mL 11  . hydrOXYzine (VISTARIL) 50 MG capsule TK ONE C PO TID PRA  2  . lamoTRIgine (LAMICTAL) 150 MG tablet Take 300 mg by mouth at bedtime.  1  . levocetirizine (XYZAL) 5 MG tablet Take 5 mg by mouth every evening.  1  . lurasidone (LATUDA) 20 MG TABS tablet Take by mouth. Takes in the morning.    . lurasidone (LATUDA) 80 MG TABS tablet Take 1 tablet (80 mg total) by mouth daily with  supper. For mood control 30 tablet 0  . meloxicam (MOBIC) 15 MG tablet TK 1 T PO QD    . metoprolol succinate (TOPROL-XL) 100 MG 24 hr tablet Take 1 tablet (100 mg total) by mouth daily. Take with or immediately following a meal. 30 tablet 0  . promethazine (PHENERGAN) 25 MG tablet TK 1 T PO BID FOR 15 DAYS PRN 30 tablet 0  . SUMAtriptan Succinate Refill 6 MG/0.5ML SOCT Inject 0.5 ml SQ at the onset of migraine. May repeat in 2 hours if needed. 0.5 mL 5  . traZODone (DESYREL) 50 MG tablet Take 1 tablet (50 mg total) by mouth at bedtime as needed for sleep. (Patient taking differently: Take 100 mg by mouth. ) 30 tablet 0   Current Facility-Administered Medications on File Prior to Visit  Medication Dose Route Frequency Provider Last Rate Last Admin  . methylPREDNISolone acetate (DEPO-MEDROL) injection 40 mg  40 mg Intra-articular Once Jodeci Rini, Sharyn Lull, MD      . valproate (DEPACON) 1,000 mg in dextrose 5 % 50 mL IVPB  1,000 mg Intravenous Once Dohmeier, Asencion Partridge, MD        Past Surgical History:  Procedure Laterality Date  . ANKLE SURGERY  12/88  left   . chest nodule  1990?   rt chest wall nodule removal  . GANGLION CYST EXCISION  2011  . lipoma removal    . right bunioectomy    . SHOULDER SURGERY  01/13/2011   right, partial tear  . tumor resection left thigh      Allergies  Allergen Reactions  . Adhesive [Tape] Itching and Rash    Also reacted to Steri Strips and Band-Aids.  . Dilaudid [Hydromorphone Hcl] Itching  . Morphine Nausea And Vomiting  . Penicillins Hives    Has patient had a PCN reaction causing immediate rash, facial/tongue/throat swelling, SOB or lightheadedness with hypotension: YES Has patient had a PCN reaction causing severe rash involving mucus membranes or skin necrosis: NO Has patient had a PCN reaction that required hospitalization NO Has patient had a PCN reaction occurring within the last 10 years:NO If all of the above answers are "NO", then may proceed  with Cephalosporin use.  Marland Kitchen Percocet [Oxycodone-Acetaminophen] Itching  . Prednisone Hives  . Provera [Medroxyprogesterone Acetate] Other (See Comments)    Causes manic episodes  . Ultram [Tramadol Hcl] Itching    Social History   Socioeconomic History  . Marital status: Single    Spouse name: Not on file  . Number of children: 0  . Years of education: Not on file  . Highest education level: Not on file  Occupational History    Employer: [employer]  Tobacco Use  . Smoking status: Never Smoker  . Smokeless tobacco: Never Used  Substance and Sexual Activity  . Alcohol use: No    Alcohol/week: 0.0 standard drinks  . Drug use: No  . Sexual activity: Never    Birth control/protection: Pill  Other Topics Concern  . Not on file  Social History Narrative   Caffeine  2 sodas daily, 1 cup coffee daily.   Social Determinants of Health   Financial Resource Strain:   . Difficulty of Paying Living Expenses: Not on file  Food Insecurity:   . Worried About Charity fundraiser in the Last Year: Not on file  . Ran Out of Food in the Last Year: Not on file  Transportation Needs:   . Lack of Transportation (Medical): Not on file  . Lack of Transportation (Non-Medical): Not on file  Physical Activity:   . Days of Exercise per Week: Not on file  . Minutes of Exercise per Session: Not on file  Stress:   . Feeling of Stress : Not on file  Social Connections:   . Frequency of Communication with Friends and Family: Not on file  . Frequency of Social Gatherings with Friends and Family: Not on file  . Attends Religious Services: Not on file  . Active Member of Clubs or Organizations: Not on file  . Attends Archivist Meetings: Not on file  . Marital Status: Not on file  Intimate Partner Violence:   . Fear of Current or Ex-Partner: Not on file  . Emotionally Abused: Not on file  . Physically Abused: Not on file  . Sexually Abused: Not on file    Family History  Problem Relation  Age of Onset  . Hypertension Mother   . Hyperlipidemia Mother   . 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 Heart attack Father   . Heart disease Father   . Hypertension Father   . Bipolar disorder Father   . Diabetes Paternal Grandfather   .  Heart disease Maternal Aunt   . Breast cancer Maternal Aunt 65  . Heart disease Maternal Grandmother   . Colon cancer Maternal Grandmother 66  . Colon cancer Maternal Uncle 75  . Lymphoma Maternal Grandfather 81  . Breast cancer Maternal Aunt 65  . Breast cancer Maternal Aunt 35  . Breast cancer Maternal Aunt   . Bone cancer Cousin 40  . 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     Objective:  Physical Exam:  Gen: NAD, comfortable in exam room  Left wrist/hand: Mild swelling of wrist.  No hand swelling.  Palpable nodule/hematoma volar aspect of wrist. Mod limitation motion of wrist and thumb but able to do so in all directions with some strength. TTP distal radius, snuffbox, 1st metacarpal primarily.   NVI distally.   Assessment & Plan:  1. Left wrist/hand injury - independently reviewed radiographs and no acute fracture visualized.  Will place in thumb spica to treat for possible occult scaphoid fracture.  Ibuprofen, tylenol, icing.  F/u in 2 weeks for reevaluation, consider repeat radiographs and ultrasound depending on exam.

## 2019-03-24 ENCOUNTER — Ambulatory Visit
Admission: RE | Admit: 2019-03-24 | Discharge: 2019-03-24 | Disposition: A | Discharge status: Home / Self Care | Payer: PPO | Source: Ambulatory Visit | Attending: Family Medicine | Admitting: Family Medicine

## 2019-03-24 ENCOUNTER — Ambulatory Visit (INDEPENDENT_AMBULATORY_CARE_PROVIDER_SITE_OTHER): Payer: PPO | Admitting: Family Medicine

## 2019-03-24 ENCOUNTER — Other Ambulatory Visit: Payer: Self-pay

## 2019-03-24 ENCOUNTER — Encounter: Payer: Self-pay | Admitting: Family Medicine

## 2019-03-24 VITALS — BP 116/82 | Ht 70.0 in | Wt 251.0 lb

## 2019-03-24 DIAGNOSIS — M25532 Pain in left wrist: Secondary | ICD-10-CM

## 2019-03-24 DIAGNOSIS — S6992XA Unspecified injury of left wrist, hand and finger(s), initial encounter: Secondary | ICD-10-CM | POA: Diagnosis not present

## 2019-03-24 MED ORDER — DICLOFENAC SODIUM 75 MG PO TBEC: 75.0000 mg | DELAYED_RELEASE_TABLET | Freq: Two times a day (BID) | ORAL | 1 refills | Status: DC

## 2019-03-24 NOTE — Patient Instructions (Signed)
Get x-rays after you leave today. If these are normal we will order an MRI to assess for an occult scaphoid fracture or scapholunate dissociation. Voltaren 75mg  twice a day with food - stop the ibuprofen. Tylenol 500mg  1-2 tabs three times a day for pain in addition to the voltaren. Continue the brace, icing 3-4 times a day.

## 2019-03-24 NOTE — Progress Notes (Signed)
PCP: Aretta Nip, MD  Subjective:   HPI: Patient is a 49 y.o. female here for left wrist injury.  2/10: Patient reports yesterday she tripped over the stand of a fan and sustained a [place] injury to her left wrist. Immediate pain, some swelling of wrist and into left thumb. Difficulty moving both the wrist and the thumb. Some bruising of her knee as well but this doesn't bother her currently. No skin changes of hand/wrist.  2/22: Patient returns with persistent pain of dorsal left wrist. Wearing thumb spica brace and taking ibuprofen 851m tid without much benefit. Swelling has improved some but pain still sharp and severe. No new injuries.  Past Medical History:  Diagnosis Date  . Achilles tendinitis   . Achilles tendinitis   . ADHD (attention deficit hyperactivity disorder)   . Allergy   . Arthritis   . Bipolar affective ([place]   . Bipolar disorder ([place]   . Cataracts, bilateral   . Eczema   . Family history of breast cancer   . Family history of colon cancer   . Family history of genetic disease carrier   . Ganglion cyst 09/29/2009   left wrist (2 cyst)  . Hyperprolactinemia ([place]   . Hypertension   . Lipoma   . Migraine   . Personality disorder ([place]   . Pes planus     Current Outpatient Medications on File Prior to Visit  Medication Sig Dispense Refill  . clobetasol cream (TEMOVATE) 0.05 %   3  . divalproex (DEPAKOTE ER) 500 MG 24 hr tablet TAKE 2 TABLETS BY MOUTH AT BEDTIME FOR MOOD 60 tablet 5  . Galcanezumab-gnlm (EMGALITY) 120 MG/ML SOAJ Inject 120 mg into the skin every 30 (thirty) days. 1 mL 11  . hydrOXYzine (VISTARIL) 50 MG capsule TK ONE C PO TID PRA  2  . lamoTRIgine (LAMICTAL) 150 MG tablet Take 300 mg by mouth at bedtime.  1  . levocetirizine (XYZAL) 5 MG tablet Take 5 mg by mouth every evening.  1  . lurasidone (LATUDA) 80 MG TABS tablet Take 1 tablet (80 mg total) by mouth daily with supper. For mood control 30 tablet 0  . metoprolol  succinate (TOPROL-XL) 100 MG 24 hr tablet Take 1 tablet (100 mg total) by mouth daily. Take with or immediately following a meal. 30 tablet 0  . promethazine (PHENERGAN) 25 MG tablet TK 1 T PO BID FOR 15 DAYS PRN 30 tablet 0  . SUMAtriptan Succinate Refill 6 MG/0.5ML SOCT Inject 0.5 ml SQ at the onset of migraine. May repeat in 2 hours if needed. 0.5 mL 5  . traZODone (DESYREL) 50 MG tablet Take 1 tablet (50 mg total) by mouth at bedtime as needed for sleep. (Patient taking differently: Take 100 mg by mouth. ) 30 tablet 0   Current Facility-Administered Medications on File Prior to Visit  Medication Dose Route Frequency Provider Last Rate Last Admin  . methylPREDNISolone acetate (DEPO-MEDROL) injection 40 mg  40 mg Intra-articular Once Ronald Vinsant, SSharyn Lull MD      . valproate (DEPACON) 1,000 mg in dextrose 5 % 50 mL IVPB  1,000 mg Intravenous Once Dohmeier, CAsencion Partridge MD        Past Surgical History:  Procedure Laterality Date  . ANKLE SURGERY  12/88   left   . chest nodule  1990?   rt chest wall nodule removal  . GANGLION CYST EXCISION  2011  . lipoma removal    . right bunioectomy    .  SHOULDER SURGERY  01/13/2011   right, partial tear  . tumor resection left thigh      Allergies  Allergen Reactions  . Adhesive [Tape] Itching and Rash    Also reacted to Steri Strips and Band-Aids.  . Dilaudid [Hydromorphone Hcl] Itching  . Morphine Nausea And Vomiting  . Penicillins Hives    Has patient had a PCN reaction causing immediate rash, facial/tongue/throat swelling, SOB or lightheadedness with hypotension: YES Has patient had a PCN reaction causing severe rash involving mucus membranes or skin necrosis: NO Has patient had a PCN reaction that required hospitalization NO Has patient had a PCN reaction occurring within the last 10 years:NO If all of the above answers are "NO", then may proceed with Cephalosporin use.  Marland Kitchen Percocet [Oxycodone-Acetaminophen] Itching  . Prednisone Hives  .  Provera [Medroxyprogesterone Acetate] Other (See Comments)    Causes manic episodes  . Ultram [Tramadol Hcl] Itching    Social History   Socioeconomic History  . Marital status: Single    Spouse name: Not on file  . Number of children: 0  . Years of education: Not on file  . Highest education level: Not on file  Occupational History    Employer: [employer]  Tobacco Use  . Smoking status: Never Smoker  . Smokeless tobacco: Never Used  Substance and Sexual Activity  . Alcohol use: No    Alcohol/week: 0.0 standard drinks  . Drug use: No  . Sexual activity: Never    Birth control/protection: Pill  Other Topics Concern  . Not on file  Social History Narrative   Caffeine  2 sodas daily, 1 cup coffee daily.   Social Determinants of Health   Financial Resource Strain:   . Difficulty of Paying Living Expenses: Not on file  Food Insecurity:   . Worried About Charity fundraiser in the Last Year: Not on file  . Ran Out of Food in the Last Year: Not on file  Transportation Needs:   . Lack of Transportation (Medical): Not on file  . Lack of Transportation (Non-Medical): Not on file  Physical Activity:   . Days of Exercise per Week: Not on file  . Minutes of Exercise per Session: Not on file  Stress:   . Feeling of Stress : Not on file  Social Connections:   . Frequency of Communication with Friends and Family: Not on file  . Frequency of Social Gatherings with Friends and Family: Not on file  . Attends Religious Services: Not on file  . Active Member of Clubs or Organizations: Not on file  . Attends Archivist Meetings: Not on file  . Marital Status: Not on file  Intimate Partner Violence:   . Fear of Current or Ex-Partner: Not on file  . Emotionally Abused: Not on file  . Physically Abused: Not on file  . Sexually Abused: Not on file    Family History  Problem Relation Age of Onset  . Hypertension Mother   . Hyperlipidemia Mother   . 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 Heart attack Father   . Heart disease Father   . Hypertension Father   . Bipolar disorder Father   . Diabetes Paternal Grandfather   . Heart disease Maternal Aunt   . Breast cancer Maternal Aunt 65  . Heart disease Maternal Grandmother   . Colon cancer Maternal Grandmother 66  . Colon cancer Maternal Uncle 75  .  Lymphoma Maternal Grandfather 58  . Breast cancer Maternal Aunt 65  . Breast cancer Maternal Aunt 35  . Breast cancer Maternal Aunt   . Bone cancer Cousin 58  . 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     Objective:  Physical Exam:  Gen: NAD, comfortable in exam room  Left wrist/hand: Mild swelling dorsal wrist.  No hand swelling.  Hematoma improved volar wrist.   Mod limitation ROM wrist and thumb but slight improvement compared to last visit. Able to flex, abduct, extend digits including thumb. 1st MCP UCL intact. Tenderness to palpation snuffbox, 1st metacarpal primarily.   NVI distally.   Assessment & Plan:  1. Left wrist/hand injury - independently reviewed repeat radiographs and no acute fracture visualized.  Concern about occult scaphoid fracture or scapholunate dissociation.  Will go ahead with MRI to assess.  Voltaren, tylenol, thumb spica brace in meantime.

## 2019-03-25 ENCOUNTER — Other Ambulatory Visit: Payer: Self-pay

## 2019-03-25 DIAGNOSIS — M25532 Pain in left wrist: Secondary | ICD-10-CM

## 2019-03-26 ENCOUNTER — Ambulatory Visit: Payer: PPO | Admitting: Family Medicine

## 2019-03-29 ENCOUNTER — Other Ambulatory Visit: Payer: Self-pay

## 2019-03-29 ENCOUNTER — Ambulatory Visit (INDEPENDENT_AMBULATORY_CARE_PROVIDER_SITE_OTHER): Payer: PPO

## 2019-03-29 DIAGNOSIS — M25532 Pain in left wrist: Secondary | ICD-10-CM

## 2019-03-29 DIAGNOSIS — M25432 Effusion, left wrist: Secondary | ICD-10-CM | POA: Diagnosis not present

## 2019-03-31 ENCOUNTER — Other Ambulatory Visit: Payer: Self-pay

## 2019-03-31 DIAGNOSIS — M25532 Pain in left wrist: Secondary | ICD-10-CM

## 2019-03-31 NOTE — Addendum Note (Signed)
Addended by: Jolinda Croak E on: 03/31/2019 03:04 PM   Modules accepted: Orders

## 2019-04-07 ENCOUNTER — Ambulatory Visit: Payer: PPO | Admitting: Family Medicine

## 2019-04-08 ENCOUNTER — Encounter: Payer: Self-pay | Admitting: Occupational Therapy

## 2019-04-08 ENCOUNTER — Other Ambulatory Visit: Payer: Self-pay

## 2019-04-08 ENCOUNTER — Ambulatory Visit: Payer: PPO | Attending: Family Medicine | Admitting: Occupational Therapy

## 2019-04-08 DIAGNOSIS — M6281 Muscle weakness (generalized): Secondary | ICD-10-CM | POA: Diagnosis not present

## 2019-04-08 DIAGNOSIS — R600 Localized edema: Secondary | ICD-10-CM

## 2019-04-08 DIAGNOSIS — M25532 Pain in left wrist: Secondary | ICD-10-CM | POA: Insufficient documentation

## 2019-04-08 DIAGNOSIS — M25632 Stiffness of left wrist, not elsewhere classified: Secondary | ICD-10-CM

## 2019-04-08 NOTE — Patient Instructions (Signed)
Wrist stretch.   Prayer stretch - Bringing hands together as if in prayer.   Gently tip your hands to left and to right Gently tip your hands forward and back Complete 10 reps, at least four times daily  Fingers - touch finger pads to palm, once achieved - bend wrist back and forth, keep hand gently fisted and circle your wrist Complete 10 reps, at least four times daily

## 2019-04-08 NOTE — Therapy (Signed)
[facility] [address] [place], [place], [address] Phone: [phone]   Fax:  [phone]  Occupational Therapy Evaluation  Patient Details  Name: [name] MRN: [id number] Date of Birth: 07/26/1970 Referring Provider (OT): Dr Barbaraann Barthel   Encounter Date: 04/08/2019  OT End of Session - 04/08/19 1559    Visit Number  1    Number of Visits  7    Date for OT Re-Evaluation  06/07/19    Authorization Type  [employer]...VL:MN    OT Start Time  1400    OT Stop Time  1445    OT Time Calculation (min)  45 min    Activity Tolerance  Patient tolerated treatment well    Behavior During Therapy  WFL for tasks assessed/performed       Past Medical History:  Diagnosis Date  . Achilles tendinitis   . Achilles tendinitis   . ADHD (attention deficit hyperactivity disorder)   . Allergy   . Arthritis   . Bipolar affective ([place])   . Bipolar disorder ([place])   . Cataracts, bilateral   . Eczema   . Family history of breast cancer   . Family history of colon cancer   . Family history of genetic disease carrier   . Ganglion cyst 09/29/2009   left wrist (2 cyst)  . Hyperprolactinemia ([place])   . Hypertension   . Lipoma   . Migraine   . Personality disorder ([place])   . Pes planus     Past Surgical History:  Procedure Laterality Date  . ANKLE SURGERY  12/88   left   . chest nodule  1990?   rt chest wall nodule removal  . GANGLION CYST EXCISION  2011  . lipoma removal    . right bunioectomy    . SHOULDER SURGERY  01/13/2011   right, partial tear  . tumor resection left thigh      There were no vitals filed for this visit.  Subjective Assessment - 04/08/19 1408    Subjective   I fell in Feb and hurt my wrist    Currently in Pain?  Yes    Pain Score  6     Pain Location  Wrist    Pain Orientation  Left    Pain Descriptors / Indicators  Aching    Pain Type  Chronic pain    Pain Radiating Towards  thumb  irritated as well    Pain Onset  1 to 4 weeks ago    Pain Frequency  Constant    Aggravating Factors   trying to use it    Pain Relieving Factors  ice, rest, splint    Effect of Pain on Daily Activities  out of work        [facility] OT Assessment - 04/08/19 0001      Assessment   Medical Diagnosis  Wrist sprain    Referring Provider (OT)  Dr Barbaraann Barthel    Onset Date/Surgical Date  03/12/19    Hand Dominance  Right    Prior Therapy  No      Precautions   Precautions  Fall      Prior Function   Level of Independence  Independent with basic ADLs    Vocation  Part time employment    Vocation Requirements  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 [employer] department    Leisure  You tube, movies, TV      ADL   Eating/Feeding  Minimal assistance  Grooming  Independent    Upper Body Bathing  Independent    Lower Body Bathing  Independent    Upper Body Dressing  Increased time;Minimal assistance    Lower Body Dressing  Increased time;Minimal assistance    Toilet Transfer  Independent    Toileting - Clothing Manipulation  Independent    Toileting -  Hygiene  Independent    Tub/Shower Transfer  Modified independent      IADL   Prior Level of Function Light Housekeeping  Independent    Light Housekeeping  Performs light daily tasks such as dishwashing, bed making      Observation/Other Assessments   Focus on Therapeutic Outcomes (FOTO)   NA      Posture/Postural Control   Posture/Postural Control  No significant limitations      Sensation   Light Touch  Appears Intact      Coordination   9 Hole Peg Test  Right;Left    Right 9 Hole Peg Test  29.12    Left 9 Hole Peg Test  48.16      Edema   Edema  Left wrist dorsum      ROM / Strength   AROM / PROM / Strength  AROM;Strength      AROM   Overall AROM   Deficits    AROM Assessment Site  Wrist;Finger;Thumb    Right/Left Wrist  Left    Left Wrist Extension  30 Degrees    Left Wrist Flexion  30 Degrees    Left Wrist Radial Deviation  15 Degrees    Left  Wrist Ulnar Deviation  20 Degrees    Left Composite Finger Extension  75%    Left Composite Finger Flexion  50%    Right/Left Thumb  Left    Left Thumb Opposition  Digit 2;Digit 3;Digit 4;Digit 5      Hand Function   Right Hand Gross Grasp  Functional    Right Hand Grip (lbs)  55    Right Hand Lateral Pinch  12 lbs    Right Hand 3 Point Pinch  4 lbs    Left Hand Gross Grasp  Impaired    Left Hand Grip (lbs)  15    Left Hand Lateral Pinch  10 lbs    Left 3 point pinch  10 lbs                      OT Education - 04/08/19 1559    Education Details  Reviewed eval results, potential goals and plan of care, initiated HEP    Person(s) Educated  Patient    Methods  Explanation;Demonstration;Tactile cues;Handout    Comprehension  Verbalized understanding;Returned demonstration;Need further instruction       OT Short Term Goals - 04/08/19 1607      OT SHORT TERM GOAL #1   Title  Patient will complete a HEP to improve wrist range of motion    Time  3    Period  Weeks    Status  New    Target Date  05/08/19      OT SHORT TERM GOAL #2   Title  Patient will demonstrate edema management techniques    Baseline  xxx    Time  4    Period  Weeks    Status  New      OT SHORT TERM GOAL #3   Title  Patient will demonstrate no greater that 3/10 pain with light in hand manipulation  skills    Time  3    Period  Weeks    Status  New      OT SHORT TERM GOAL #4   Title  Pt will increase wrist flexion/ extension to 45* or greater for increased functional use.    Baseline  30/30    Time  3    Period  Weeks    Status  New        OT Long Term Goals - 04/08/19 1610      OT LONG TERM GOAL #1   Title  Patient will complete updated HEP    Time  6    Period  Weeks    Status  New    Target Date  06/07/19      OT LONG TERM GOAL #2   Title  Improve grip strength Lt hand by 10 lbs or greater to open jars/containers    Time  6    Period  Weeks      OT LONG TERM GOAL #3    Title  Patient will demonstrate reduced time on 9 hole peg test by 10 seconds as evidence of improved coordiantion of hand/wrist/ forearm as needed for functional hand positioning    Baseline  49 sec- left    Time  6    Period  Weeks    Status  New      OT LONG TERM GOAL #4   Title  Patient will report pain no greater than 2/10 when using left hand to assist as non dominant during light household tasks, or ADL    Baseline  6/10    Time  6    Period  Weeks    Status  New      OT LONG TERM GOAL #5   Title  XXX    Baseline  XX            Plan - 04/08/19 1600    Clinical Impression Statement  Patient is a 49 year old woman well known to this clinic from prior episodes of care. Patient referred to OT s/p fall with left wrist sprain.  Patient presents during OT evaluation with the following impairments which impede performance of ADL/IADL- Pain in left wrist, decreased range of motion left wrist, decreased strength wrist/hand, and decreased functional use of non-dominant LUE.  Patient will benefit from skilled OT intervention to reduce her pain and inflammation, and help her return to functional use of LUE in ADL/IADL, and return to work.    OT Occupational Profile and History  Detailed Assessment- Review of Records and additional review of physical, cognitive, psychosocial history related to current functional performance    Occupational performance deficits (Please refer to evaluation for details):  ADL's;IADL's;Rest and Sleep;Work    Marketing executive / Function / Physical Skills  ADL;Coordination;Endurance;GMC;UE functional use;Decreased knowledge of precautions;Fascial restriction;Pain;IADL;Flexibility;Decreased knowledge of use of DME;Body mechanics;Dexterity;FMC;Balance;Strength;ROM;Edema    Rehab Potential  Good    Clinical Decision Making  Limited treatment options, no task modification necessary    Comorbidities Affecting Occupational Performance:  May have comorbidities impacting  occupational performance   bipolar, prior injuries, dizziness   Modification or Assistance to Complete Evaluation   No modification of tasks or assist necessary to complete eval    OT Frequency  1x / week    OT Duration  6 weeks    OT Treatment/Interventions  Self-care/ADL training;Electrical Stimulation;Iontophoresis;Therapeutic exercise;Patient/family education;Splinting;Compression bandaging;Neuromuscular education;Moist Heat;Traction;Fluidtherapy;Therapeutic activities;Passive range of motion;Manual Therapy;DME and/or AE instruction;Ultrasound;Cryotherapy  Plan  Review HEP and add wrist exercises, edema massage and education, improve range of motion and functional use    OT Home Exercise Plan  Wrist exercises    Consulted and Agree with Plan of Care  Patient       Patient will benefit from skilled therapeutic intervention in order to improve the following deficits and impairments:   Body Structure / Function / Physical Skills: ADL, Coordination, Endurance, GMC, UE functional use, Decreased knowledge of precautions, Fascial restriction, Pain, IADL, Flexibility, Decreased knowledge of use of DME, Body mechanics, Dexterity, FMC, Balance, Strength, ROM, Edema       Visit Diagnosis: Pain in left wrist - Plan: Ot plan of care cert/re-cert  Localized edema - Plan: Ot plan of care cert/re-cert  Stiffness of left wrist, not elsewhere classified - Plan: Ot plan of care cert/re-cert  Muscle weakness (generalized) - Plan: Ot plan of care cert/re-cert    Problem List Patient Active Problem List   Diagnosis Date Noted  . 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 Genetic testing 02/27/2018  . Family history of genetic disease carrier   . Family history of breast cancer   . Family history of colon cancer   . Head injury with loss of consciousness ([place]) 11/20/2017  . Nausea 11/20/2017  . 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 Tremor observed on examination 11/20/2017  . Left shoulder pain 02/18/2017  . MDD (major depressive disorder), recurrent severe, without psychosis ([place]) 12/28/2016  . Muscle strain 09/19/2016  . Right foot injury, subsequent encounter 08/25/2016  . Low back pain 06/27/2016  . Left wrist injury, subsequent encounter 06/27/2016  . Pain of left thumb 10/07/2015  . 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 Strain of left thumb 02/11/2015  . Strain of right forearm 02/11/2015  . Right shoulder pain 12/31/2014  . Injury of right little finger 12/31/2014  . Episodic cluster headache, not intractable 12/02/2014  . 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 Hypersomnia, recurrent 12/02/2014  . Migraine aura, persistent, intractable, with status migrainosus 12/02/2014  . Lower back injury 09/30/2014  . Bipolar I disorder, most recent episode depressed ([place])   . MDD (major depressive disorder), recurrent, severe, with psychosis ([place]) 09/23/2014  . Injury of left index finger 08/20/2014  . Right ankle sprain 06/01/2014  . Contusion, multiple sites 06/01/2014  . Strain of right gastrocnemius muscle 06/01/2014  . Phonophobia 05/04/2014  . Photophobia of both eyes 05/04/2014  . Emotionally unstable borderline personality disorder ([place]) 05/04/2014  . Nausea with vomiting 05/04/2014  . Mixed bipolar I disorder ([place])   . Bipolar I disorder, most recent episode mixed ([place]) 04/18/2014  . Bipolar affective disorder, depressed, mild ([place]) 04/12/2014  . Suicidal ideation 04/12/2014  . Injury of right shoulder and upper arm 02/17/2014  . Left leg pain 06/03/2013  . Right hip pain 06/03/2013  . Migraine with status migrainosus 01/08/2013  . Personality disorder ([place])   . Chronic migraine 05/08/2012  . Contact dermatitis 11/27/2011  . Major depressive disorder, recurrent episode ([place])  10/27/2011  . Generalized anxiety disorder 10/27/2011  . 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 Borderline personality disorder ([place]) 10/27/2011  . Right foot pain 09/28/2011  . 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 Ganglion cyst 09/29/2009  .  PES PLANUS 07/01/2008  . BIPOLAR DISORDER UNSPECIFIED 06/09/2008    Mariah Milling, OTR/L 04/08/2019, 4:16 PM  [facility] [address] [place], [place], [address] Phone: [phone]   Fax:  [phone]  Name: ARPITA FENTRESS MRN: [id number] Date of Birth: July 13, 1970

## 2019-04-10 DIAGNOSIS — H31002 Unspecified chorioretinal scars, left eye: Secondary | ICD-10-CM | POA: Diagnosis not present

## 2019-04-10 DIAGNOSIS — H43813 Vitreous degeneration, bilateral: Secondary | ICD-10-CM | POA: Diagnosis not present

## 2019-04-10 DIAGNOSIS — H5310 Unspecified subjective visual disturbances: Secondary | ICD-10-CM | POA: Diagnosis not present

## 2019-04-10 DIAGNOSIS — H524 Presbyopia: Secondary | ICD-10-CM | POA: Diagnosis not present

## 2019-04-14 ENCOUNTER — Encounter: Payer: Self-pay | Admitting: Family Medicine

## 2019-04-14 NOTE — Progress Notes (Signed)
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

## 2019-04-15 ENCOUNTER — Encounter: Payer: Self-pay | Admitting: Occupational Therapy

## 2019-04-15 ENCOUNTER — Other Ambulatory Visit: Payer: Self-pay

## 2019-04-15 ENCOUNTER — Ambulatory Visit: Payer: PPO | Admitting: Occupational Therapy

## 2019-04-15 DIAGNOSIS — M25532 Pain in left wrist: Secondary | ICD-10-CM | POA: Diagnosis not present

## 2019-04-15 DIAGNOSIS — M6281 Muscle weakness (generalized): Secondary | ICD-10-CM

## 2019-04-15 DIAGNOSIS — M25632 Stiffness of left wrist, not elsewhere classified: Secondary | ICD-10-CM

## 2019-04-15 DIAGNOSIS — R600 Localized edema: Secondary | ICD-10-CM

## 2019-04-15 NOTE — Therapy (Signed)
[facility] [address] [place], [place], [address] Phone: [phone]   Fax:  [phone]  Occupational Therapy Treatment  Patient Details  Name: [name] MRN: [id number] Date of Birth: July 26, 1970 Referring Provider (OT): Dr Barbaraann Barthel   Encounter Date: 04/15/2019  OT End of Session - 04/15/19 1832    Visit Number  2    Number of Visits  7    Date for OT Re-Evaluation  06/07/19    Authorization Type  [employer]...VL:MN    OT Start Time  1740    OT Stop Time  1825    OT Time Calculation (min)  45 min    Activity Tolerance  Patient tolerated treatment well    Behavior During Therapy  WFL for tasks assessed/performed       Past Medical History:  Diagnosis Date  . Achilles tendinitis   . Achilles tendinitis   . ADHD (attention deficit hyperactivity disorder)   . Allergy   . Arthritis   . Bipolar affective ([place])   . Bipolar disorder ([place])   . Cataracts, bilateral   . Eczema   . Family history of breast cancer   . Family history of colon cancer   . Family history of genetic disease carrier   . Ganglion cyst 09/29/2009   left wrist (2 cyst)  . Hyperprolactinemia ([place])   . Hypertension   . Lipoma   . Migraine   . Personality disorder ([place])   . Pes planus     Past Surgical History:  Procedure Laterality Date  . ANKLE SURGERY  12/88   left   . chest nodule  1990?   rt chest wall nodule removal  . GANGLION CYST EXCISION  2011  . lipoma removal    . right bunioectomy    . SHOULDER SURGERY  01/13/2011   right, partial tear  . tumor resection left thigh      There were no vitals filed for this visit.  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  Yes    Pain Score  4     Pain Location  Wrist    Pain Orientation  Left    Pain Descriptors / Indicators  Aching    Pain Type  Chronic pain     Pain Radiating Towards  Dorsum of forearm    Pain Onset  More than a month ago    Pain Frequency  Intermittent    Aggravating Factors   use    Pain Relieving Factors  ice                   OT Treatments/Exercises (OP) - 04/15/19 0001      ADLs   Work  Patient has been communicating with her employer.  SHe has been advised by Dr Barbaraann Barthel to work in a modified work schedule.  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      ADL Comments  Reviewed goals for OT program for left wrist.  Patient in agreement.        Wrist Exercises   Other wrist exercises  Educated patient in AROM exercises for wrist flex/ext, radial and ulnar devaition, and circumduction.  Issued HEP  Other wrist exercises  Gentle PROM left wrist flex ext, supination/pronation      LUE Fluidotherapy   Number Minutes Fluidotherapy  10 Minutes    LUE Fluidotherapy Location  Hand;Wrist;Forearm    Comments  for pain relief      Manual Therapy   Manual Therapy  Edema management    Edema Management  small hematoma over dorsum radial wrist - slightly tender             OT Education - 04/15/19 1832    Education Details  HEP - AROM wrist    Person(s) Educated  Patient    Methods  Explanation;Demonstration;Verbal cues;Handout    Comprehension  Verbalized understanding;Returned demonstration       OT Short Term Goals - 04/15/19 1834      OT SHORT TERM GOAL #1   Title  Patient will complete a HEP to improve wrist range of motion    Status  On-going      OT SHORT TERM GOAL #2   Title  Patient will demonstrate edema management techniques    Status  On-going      OT SHORT TERM GOAL #3   Title  Patient will demonstrate no greater that 3/10 pain with light in hand manipulation skills    Status  On-going      OT SHORT TERM GOAL #4   Title  Pt will increase wrist flexion/ extension to 45* or greater for  increased functional use.    Status  Achieved        OT Long Term Goals - 04/15/19 1746      OT LONG TERM GOAL #1   Title  Patient will complete updated HEP    Status  On-going      OT LONG TERM GOAL #2   Title  Improve grip strength Lt hand by 10 lbs or greater to open jars/containers    Status  On-going      OT LONG TERM GOAL #3   Title  Patient will demonstrate reduced time on 9 hole peg test by 10 seconds as evidence of improved coordiantion of hand/wrist/ forearm as needed for functional hand positioning    Status  On-going      OT LONG TERM GOAL #4   Title  Patient will report pain no greater than 2/10 when using left hand to assist as non dominant during light household tasks, or ADL    Status  On-going            Plan - 04/15/19 1833    Clinical Impression Statement  Patient agreeable to goals for OT program.  Planning to retunr to modified work schedule next week.  Patient with decreasing wrist pain overall.    OT Frequency  1x / week    OT Duration  6 weeks    OT Treatment/Interventions  Self-care/ADL training;Electrical Stimulation;Iontophoresis;Therapeutic exercise;Patient/family education;Splinting;Compression bandaging;Neuromuscular education;Moist Heat;Traction;Fluidtherapy;Therapeutic activities;Passive range of motion;Manual Therapy;DME and/or AE instruction;Ultrasound;Cryotherapy    Plan  Review HEP, edema massage and education, improve range of motion and functional use - in hand manipulation toward light strengthening    OT Home Exercise Plan  Wrist exercises    Consulted and Agree with Plan of Care  Patient       Patient will benefit from skilled therapeutic intervention in order to improve the following deficits and impairments:           Visit Diagnosis: Pain in left wrist  Localized edema  Stiffness of left wrist, not  elsewhere classified  Muscle weakness (generalized)    Problem List Patient Active Problem List   Diagnosis Date  Noted  . 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 Genetic testing 02/27/2018  . Family history of genetic disease carrier   . Family history of breast cancer   . Family history of colon cancer   . Head injury with loss of consciousness ([place]) 11/20/2017  . Nausea 11/20/2017  . 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 Intractable persistent migraine aura with cerebral infarction and status migrainosus ([place]) 11/20/2017  . Tremor observed on examination 11/20/2017  . Left shoulder pain 02/18/2017  . MDD (major depressive disorder), recurrent severe, without psychosis ([place]) 12/28/2016  . Muscle strain 09/19/2016  . Right foot injury, subsequent encounter 08/25/2016  . Low back pain 06/27/2016  . Left wrist injury, subsequent encounter 06/27/2016  . Pain of left thumb 10/07/2015  . 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 Strain of left thumb 02/11/2015  . Strain of right forearm 02/11/2015  . Right shoulder pain 12/31/2014  . Injury of right little finger 12/31/2014  . Episodic cluster headache, not intractable 12/02/2014  . 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 Hypersomnia, recurrent 12/02/2014  . Migraine aura, persistent, intractable, with status migrainosus 12/02/2014  . Lower back injury 09/30/2014  . Bipolar I disorder, most recent episode depressed ([employer])   . MDD (major depressive disorder), recurrent, severe, with psychosis ([place]) 09/23/2014  . Injury of left index finger 08/20/2014  . Right ankle sprain 06/01/2014  . Contusion, multiple sites 06/01/2014  . Strain of right gastrocnemius muscle 06/01/2014  . Phonophobia 05/04/2014  . Photophobia of both eyes 05/04/2014  . Emotionally unstable borderline personality disorder ([place]) 05/04/2014  . Nausea with vomiting 05/04/2014  . Mixed bipolar I disorder ([place])   . Bipolar I disorder, most recent episode mixed ([place]) 04/18/2014  . Bipolar affective  disorder, depressed, mild ([place]) 04/12/2014  . Suicidal ideation 04/12/2014  . Injury of right shoulder and upper arm 02/17/2014  . Left leg pain 06/03/2013  . Right hip pain 06/03/2013  . Migraine with status migrainosus 01/08/2013  . Personality disorder ([place])   . Chronic migraine 05/08/2012  . Contact dermatitis 11/27/2011  . Major depressive disorder, recurrent episode ([place]) 10/27/2011  . Generalized anxiety disorder 10/27/2011  . 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 Borderline personality disorder ([place]) 10/27/2011  . Right foot pain 09/28/2011  . 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 Ganglion cyst 09/29/2009  . PES PLANUS 07/01/2008  . BIPOLAR DISORDER UNSPECIFIED 06/09/2008    Mariah Milling, OTR/L 04/15/2019, 6:35 PM  [facility] [address] [place], [place], [address] Phone: [phone]   Fax:  [phone]  Name: WILNA PENNIE MRN: [id number] Date of Birth: 1970-05-28

## 2019-04-15 NOTE — Patient Instructions (Signed)
Access Code: [email or web address] URL: [email or web address] Date: 04/15/2019 Prepared by: Marlowe Sax  Exercises Wrist AROM Flexion Extension - 3 x daily - 7 x weekly - 10 reps - 2 sets Wrist AROM Radial Ulnar Deviation - 3 x daily - 7 x weekly - 10 reps - 2 sets Wrist Circumduction AROM - 3 x daily - 7 x weekly - 10 reps - 2 sets

## 2019-04-17 DIAGNOSIS — G43001 Migraine without aura, not intractable, with status migrainosus: Secondary | ICD-10-CM | POA: Diagnosis not present

## 2019-04-17 DIAGNOSIS — F319 Bipolar disorder, unspecified: Secondary | ICD-10-CM | POA: Diagnosis not present

## 2019-04-17 DIAGNOSIS — F3130 Bipolar disorder, current episode depressed, mild or moderate severity, unspecified: Secondary | ICD-10-CM | POA: Diagnosis not present

## 2019-04-17 DIAGNOSIS — I10 Essential (primary) hypertension: Secondary | ICD-10-CM | POA: Diagnosis not present

## 2019-04-21 ENCOUNTER — Encounter: Payer: Self-pay | Admitting: Occupational Therapy

## 2019-04-21 ENCOUNTER — Ambulatory Visit: Payer: PPO | Admitting: Occupational Therapy

## 2019-04-21 ENCOUNTER — Other Ambulatory Visit: Payer: Self-pay

## 2019-04-21 ENCOUNTER — Telehealth: Payer: Self-pay | Admitting: Adult Health

## 2019-04-21 DIAGNOSIS — M25532 Pain in left wrist: Secondary | ICD-10-CM | POA: Diagnosis not present

## 2019-04-21 DIAGNOSIS — R600 Localized edema: Secondary | ICD-10-CM

## 2019-04-21 DIAGNOSIS — M25632 Stiffness of left wrist, not elsewhere classified: Secondary | ICD-10-CM

## 2019-04-21 DIAGNOSIS — R4789 Other speech disturbances: Secondary | ICD-10-CM

## 2019-04-21 DIAGNOSIS — M6281 Muscle weakness (generalized): Secondary | ICD-10-CM

## 2019-04-21 NOTE — Patient Instructions (Signed)
  Coordination Activities  Perform the following activities for 5-10 minutes 2-3 times per day with left hand(s).   Flip cards 1 at a time as fast as you can.  Rotate card in hand (clockwise and counter-clockwise).  Shuffle cards.  Pick up coins and place in container or coin bank.  Pick up coins and stack.  Pick up coins one at a time until you get 5-10 in your hand, then move coins from palm to fingertips to stack one at a time.  Twirl pen between fingers.

## 2019-04-21 NOTE — Therapy (Signed)
[facility] [address] [place], [place], [address] Phone: [phone]   Fax:  [phone]  Occupational Therapy Treatment  Patient Details  Name: [name] MRN: [id number] Date of Birth: [birth year] Referring Provider (OT): Dr Barbaraann Barthel   Encounter Date: 04/21/2019  OT End of Session - 04/21/19 1544    Visit Number  3    Number of Visits  7    Date for OT Re-Evaluation  06/07/19    Authorization Type  [employer]...VL:MN    OT Start Time  1403    OT Stop Time  1445    OT Time Calculation (min)  42 min    Activity Tolerance  Patient tolerated treatment well    Behavior During Therapy  WFL for tasks assessed/performed       Past Medical History:  Diagnosis Date  . Achilles tendinitis   . Achilles tendinitis   . ADHD (attention deficit hyperactivity disorder)   . Allergy   . Arthritis   . Bipolar affective ([place])   . Bipolar disorder ([place])   . Cataracts, bilateral   . Eczema   . Family history of breast cancer   . Family history of colon cancer   . Family history of genetic disease carrier   . Ganglion cyst 09/29/2009   left wrist (2 cyst)  . Hyperprolactinemia ([place])   . Hypertension   . Lipoma   . Migraine   . Personality disorder ([place])   . Pes planus     Past Surgical History:  Procedure Laterality Date  . ANKLE SURGERY  12/88   left   . chest nodule  1990?   rt chest wall nodule removal  . GANGLION CYST EXCISION  2011  . lipoma removal    . right bunioectomy    . SHOULDER SURGERY  01/13/2011   right, partial tear  . tumor resection left thigh      There were no vitals filed for this visit.  Subjective Assessment - 04/21/19 1407    Subjective   I was able to wash my hair today, so it is sore.    Currently in Pain?  Yes    Pain Score  4     Pain Location  Wrist   dorsum of left wrist and distal forearm   Pain Orientation  Left    Pain Descriptors / Indicators  Aching    Pain Type  Chronic pain    Pain Radiating Towards  dorsum of forearm    Pain Onset  More than a month ago    Aggravating Factors   use    Pain Relieving Factors  ice    Effect of Pain on Daily Activities  out of work         [facility] OT Assessment - 04/21/19 0001      AROM   Left Wrist Extension  60 Degrees    Left Wrist Flexion  65 Degrees    Left Wrist Radial Deviation  20 Degrees    Left Wrist Ulnar Deviation  35 Degrees    Left Composite Finger Extension  --   100   Left Composite Finger Flexion  --   100              OT Treatments/Exercises (OP) - 04/21/19 0001      ADLs   Bathing  Patient was able to wash her hair for the first time.      Work  Patient starts  work this week, and is scheduled for 18 hours this week.      Leisure  Has been cutting out sewing patterns, reports that pressing left hand onto yard stick to measure, caused discomfort.        Exercises   Exercises  Hand      Wrist Exercises   Other wrist exercises  Reviewed HEP.  Patient needs continued work to improve motion, specifically radila deviation.  Has shown significant improvement in active flex/ext, ulnar deviation.      Other wrist exercises  Gentle passive wrist flex/ext to nearly full range of motion in both directions.        Hand Exercises   Other Hand Exercises  In hand manipulation, forearm, wrist and hand coordination exercises, and bimanula task practice.               OT Education - 04/21/19 1544    Education Details  Added fine motor coordiantion exercises    Person(s) Educated  Patient    Methods  Explanation;Demonstration;Tactile cues;Verbal cues;Handout    Comprehension  Verbalized understanding;Returned demonstration       OT Short Term Goals - 04/21/19 1547      OT SHORT TERM GOAL #1   Title  Patient will complete a HEP to improve wrist range of motion    Time  3    Period  Weeks    Status  Achieved      OT SHORT TERM GOAL #2   Title  Patient will  demonstrate edema management techniques    Time  3    Period  Weeks    Status  On-going      OT SHORT TERM GOAL #3   Title  Patient will demonstrate no greater that 3/10 pain with light in hand manipulation skills    Time  3    Period  Weeks    Status  On-going      OT SHORT TERM GOAL #4   Title  Pt will increase wrist flexion/ extension to 45* or greater for increased functional use.    Baseline  30/30    Time  3    Period  Weeks    Status  Achieved        OT Long Term Goals - 04/15/19 1746      OT LONG TERM GOAL #1   Title  Patient will complete updated HEP    Status  On-going      OT LONG TERM GOAL #2   Title  Improve grip strength Lt hand by 10 lbs or greater to open jars/containers    Status  On-going      OT LONG TERM GOAL #3   Title  Patient will demonstrate reduced time on 9 hole peg test by 10 seconds as evidence of improved coordiantion of hand/wrist/ forearm as needed for functional hand positioning    Status  On-going      OT LONG TERM GOAL #4   Title  Patient will report pain no greater than 2/10 when using left hand to assist as non dominant during light household tasks, or ADL    Status  On-going            Plan - 04/21/19 1545    Clinical Impression Statement  Patient beginning to spend time with her splint off for 1.5 hours - 3-4 times daily.  She is beginning to attempt to utilize left UE functionally in a non-resistive manner.  Pain and range  of motion improving    OT Frequency  1x / week    OT Duration  6 weeks    OT Treatment/Interventions  Self-care/ADL training;Electrical Stimulation;Iontophoresis;Therapeutic exercise;Patient/family education;Splinting;Compression bandaging;Neuromuscular education;Moist Heat;Traction;Fluidtherapy;Therapeutic activities;Passive range of motion;Manual Therapy;DME and/or AE instruction;Ultrasound;Cryotherapy    Plan  PROM/AROM - Emphasis on flex/ext, radila deviation, light strengthening    OT Home Exercise Plan   Fine motor coordiantion, Wrist exercises    Consulted and Agree with Plan of Care  Patient       Patient will benefit from skilled therapeutic intervention in order to improve the following deficits and impairments:           Visit Diagnosis: Pain in left wrist  Localized edema  Stiffness of left wrist, not elsewhere classified  Muscle weakness (generalized)    Problem List Patient Active Problem List   Diagnosis Date Noted  . 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 Genetic testing 02/27/2018  . Family history of genetic disease carrier   . Family history of breast cancer   . Family history of colon cancer   . Head injury with loss of consciousness ([place]) 11/20/2017  . Nausea 11/20/2017  . 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 Tremor observed on examination 11/20/2017  . Left shoulder pain 02/18/2017  . MDD (major depressive disorder), recurrent severe, without psychosis ([place]) 12/28/2016  . Muscle strain 09/19/2016  . Right foot injury, subsequent encounter 08/25/2016  . Low back pain 06/27/2016  . Left wrist injury, subsequent encounter 06/27/2016  . Pain of left thumb 10/07/2015  . 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 Strain of left thumb 02/11/2015  . Strain of right forearm 02/11/2015  . Right shoulder pain 12/31/2014  . Injury of right little finger 12/31/2014  . Episodic cluster headache, not intractable 12/02/2014  . 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 Hypersomnia, recurrent 12/02/2014  . Migraine aura, persistent, intractable, with status migrainosus 12/02/2014  . Lower back injury 09/30/2014  . Bipolar I disorder, most recent episode depressed ([employer])   . MDD (major depressive disorder), recurrent, severe, with psychosis ([place]) 09/23/2014  . Injury of left index finger  08/20/2014  . Right ankle sprain 06/01/2014  . Contusion, multiple sites 06/01/2014  . Strain of right gastrocnemius muscle 06/01/2014  . Phonophobia 05/04/2014  . Photophobia of both eyes 05/04/2014  . Emotionally unstable borderline personality disorder ([place]) 05/04/2014  . Nausea with vomiting 05/04/2014  . Mixed bipolar I disorder ([place])   . Bipolar I disorder, most recent episode mixed ([place]) 04/18/2014  . Bipolar affective disorder, depressed, mild ([place]) 04/12/2014  . Suicidal ideation 04/12/2014  . Injury of right shoulder and upper arm 02/17/2014  . Left leg pain 06/03/2013  . Right hip pain 06/03/2013  . Migraine with status migrainosus 01/08/2013  . Personality disorder ([place])   . Chronic migraine 05/08/2012  . Contact dermatitis 11/27/2011  . Major depressive disorder, recurrent episode ([place]) 10/27/2011  . Generalized anxiety disorder 10/27/2011  . 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 Borderline personality disorder ([place]) 10/27/2011  . Right foot pain 09/28/2011  . 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 Ganglion cyst 09/29/2009  . PES PLANUS 07/01/2008  . BIPOLAR DISORDER UNSPECIFIED 06/09/2008    Mariah Milling, OTR/L 04/21/2019, 3:48 PM  [facility]  [facility] [address] [place], [place], [address] Phone: [phone]   Fax:  [phone]  Name: [name] MRN: [id number] Date of Birth: 07/29/1970

## 2019-04-21 NOTE — Telephone Encounter (Signed)
Patient would like to know if she can go back to neuro rehab in regards to her brain fog states she was advised orders would need to be sent over

## 2019-04-22 NOTE — Addendum Note (Signed)
Addended by: Noberto Retort C on: 04/22/2019 10:34 AM   Modules accepted: Orders

## 2019-04-22 NOTE — Telephone Encounter (Signed)
I returned the call to the patient. She is still having word finding difficulty along with her brain fog. She has completed speech therapy in the past and found it to be helpful.  Ok, per vo by Ward Givens, NP, to place new orders for speech therapy.

## 2019-04-29 ENCOUNTER — Encounter: Payer: PPO | Admitting: Occupational Therapy

## 2019-05-01 ENCOUNTER — Encounter: Payer: Self-pay | Admitting: Occupational Therapy

## 2019-05-05 ENCOUNTER — Encounter: Payer: Self-pay | Admitting: Occupational Therapy

## 2019-05-05 ENCOUNTER — Ambulatory Visit: Payer: PPO | Attending: Family Medicine | Admitting: Occupational Therapy

## 2019-05-05 ENCOUNTER — Other Ambulatory Visit: Payer: Self-pay

## 2019-05-05 DIAGNOSIS — R600 Localized edema: Secondary | ICD-10-CM

## 2019-05-05 DIAGNOSIS — M25632 Stiffness of left wrist, not elsewhere classified: Secondary | ICD-10-CM

## 2019-05-05 DIAGNOSIS — M25532 Pain in left wrist: Secondary | ICD-10-CM

## 2019-05-05 DIAGNOSIS — M6281 Muscle weakness (generalized): Secondary | ICD-10-CM | POA: Diagnosis not present

## 2019-05-05 DIAGNOSIS — F3130 Bipolar disorder, current episode depressed, mild or moderate severity, unspecified: Secondary | ICD-10-CM | POA: Diagnosis not present

## 2019-05-05 DIAGNOSIS — R41841 Cognitive communication deficit: Secondary | ICD-10-CM | POA: Insufficient documentation

## 2019-05-05 DIAGNOSIS — G43001 Migraine without aura, not intractable, with status migrainosus: Secondary | ICD-10-CM | POA: Diagnosis not present

## 2019-05-05 DIAGNOSIS — F319 Bipolar disorder, unspecified: Secondary | ICD-10-CM | POA: Diagnosis not present

## 2019-05-05 DIAGNOSIS — I10 Essential (primary) hypertension: Secondary | ICD-10-CM | POA: Diagnosis not present

## 2019-05-05 NOTE — Therapy (Signed)
[facility] [address] [place], [place], [address] Phone: [phone]   Fax:  [phone]  Occupational Therapy Treatment  Patient Details  Name: [name] MRN: [id number] Date of Birth: 01-Aug-1970 Referring Provider (OT): Dr Barbaraann Barthel   Encounter Date: 05/05/2019  OT End of Session - 05/05/19 1858    Visit Number  4    Number of Visits  7    Date for OT Re-Evaluation  06/07/19    Authorization Type  [employer]...VL:MN    OT Start Time  1618    OT Stop Time  1700    OT Time Calculation (min)  42 min    Activity Tolerance  Patient tolerated treatment well    Behavior During Therapy  WFL for tasks assessed/performed       Past Medical History:  Diagnosis Date  . Achilles tendinitis   . Achilles tendinitis   . ADHD (attention deficit hyperactivity disorder)   . Allergy   . Arthritis   . Bipolar affective ([place])   . Bipolar disorder ([place])   . Cataracts, bilateral   . Eczema   . Family history of breast cancer   . Family history of colon cancer   . Family history of genetic disease carrier   . Ganglion cyst 09/29/2009   left wrist (2 cyst)  . Hyperprolactinemia ([place])   . Hypertension   . Lipoma   . Migraine   . Personality disorder ([place])   . Pes planus     Past Surgical History:  Procedure Laterality Date  . ANKLE SURGERY  12/88   left   . chest nodule  1990?   rt chest wall nodule removal  . GANGLION CYST EXCISION  2011  . lipoma removal    . right bunioectomy    . SHOULDER SURGERY  01/13/2011   right, partial tear  . tumor resection left thigh      There were no vitals filed for this visit.  Subjective Assessment - 05/05/19 1620    Subjective   Its rough today    Pain Score  5     Pain Location  Wrist    Pain Orientation  Left    Pain Descriptors / Indicators  Aching    Pain Type  Chronic pain    Pain Onset  More than a month ago    Pain Frequency  Intermittent    Aggravating Factors   overuse    Pain Relieving Factors  ice         [facility] OT Assessment - 05/05/19 0001      Coordination   Left 9 Hole Peg Test  31+ sec      AROM   Left Wrist Extension  75 Degrees    Left Wrist Flexion  70 Degrees    Left Wrist Radial Deviation  20 Degrees    Left Wrist Ulnar Deviation  45 Degrees               OT Treatments/Exercises (OP) - 05/05/19 0001      ADLs   ADL Comments  Reviewed remaining goals - patient has shown improved strength, range of motion, and cooridantion in left hand.  Reviewed edema management techniques of elevation, massage and ice for comfort      Hand Exercises   Other Hand Exercises  Initiated hand strengthening program with therapy putty  - see patient instructions.        LUE Fluidotherapy   Number  Minutes Fluidotherapy  10 Minutes    LUE Fluidotherapy Location  Hand;Wrist    Comments  for pain relief             OT Education - 05/05/19 1857    Education Details  added therapy putty exercises, reviewed edema management    Person(s) Educated  Patient    Methods  Explanation;Demonstration;Tactile cues;Verbal cues;Handout    Comprehension  Verbalized understanding;Returned demonstration;Need further instruction       OT Short Term Goals - 05/05/19 1636      OT SHORT TERM GOAL #2   Title  Patient will demonstrate edema management techniques    Status  Achieved      OT SHORT TERM GOAL #3   Title  Patient will demonstrate no greater that 3/10 pain with light in hand manipulation skills    Status  Achieved        OT Long Term Goals - 05/05/19 1637      OT LONG TERM GOAL #2   Title  Improve grip strength Lt hand by 10 lbs or greater to open jars/containers    Status  Achieved   29 lb     OT LONG TERM GOAL #3   Title  Patient will demonstrate reduced time on 9 hole peg test by 10 seconds as evidence of improved coordiantion of hand/wrist/ forearm as needed for functional hand positioning    Status   Achieved   31.85           Plan - 05/05/19 1859    Clinical Impression Statement  Patient continues to progress toward remaining goals.  Improved range of motion, strength and coordination noted.    OT Frequency  1x / week    OT Duration  6 weeks    OT Treatment/Interventions  Self-care/ADL training;Electrical Stimulation;Iontophoresis;Therapeutic exercise;Patient/family education;Splinting;Compression bandaging;Neuromuscular education;Moist Heat;Traction;Fluidtherapy;Therapeutic activities;Passive range of motion;Manual Therapy;DME and/or AE instruction;Ultrasound;Cryotherapy    Plan  PROM/AROM - Emphasis on flex/ext, radila deviation, light strengthening    OT Home Exercise Plan  Fine motor coordination, Wrist exercises, putty    Consulted and Agree with Plan of Care  Patient       Patient will benefit from skilled therapeutic intervention in order to improve the following deficits and impairments:           Visit Diagnosis: Pain in left wrist  Localized edema  Stiffness of left wrist, not elsewhere classified  Muscle weakness (generalized)    Problem List Patient Active Problem List   Diagnosis Date Noted  . 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 Genetic testing 02/27/2018  . Family history of genetic disease carrier   . Family history of breast cancer   . Family history of colon cancer   . Head injury with loss of consciousness ([place]) 11/20/2017  . Nausea 11/20/2017  . 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 Tremor observed on examination 11/20/2017  . Left shoulder pain 02/18/2017  . MDD (major depressive disorder), recurrent severe, without psychosis ([place]) 12/28/2016  . Muscle strain 09/19/2016  . Right foot injury, subsequent encounter 08/25/2016  . Low back pain 06/27/2016  . Left wrist injury, subsequent encounter 06/27/2016  . Pain of left  thumb 10/07/2015  . 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 Strain of left thumb 02/11/2015  . Strain of right forearm 02/11/2015  . Right shoulder pain 12/31/2014  . Injury of right little  finger 12/31/2014  . Episodic cluster headache, not intractable 12/02/2014  . 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 Hypersomnia, recurrent 12/02/2014  . Migraine aura, persistent, intractable, with status migrainosus 12/02/2014  . Lower back injury 09/30/2014  . Bipolar I disorder, most recent episode depressed ([place])   . MDD (major depressive disorder), recurrent, severe, with psychosis ([place]) 09/23/2014  . Injury of left index finger 08/20/2014  . Right ankle sprain 06/01/2014  . Contusion, multiple sites 06/01/2014  . Strain of right gastrocnemius muscle 06/01/2014  . Phonophobia 05/04/2014  . Photophobia of both eyes 05/04/2014  . Emotionally unstable borderline personality disorder ([place]) 05/04/2014  . Nausea with vomiting 05/04/2014  . Mixed bipolar I disorder ([place])   . Bipolar I disorder, most recent episode mixed ([place]) 04/18/2014  . Bipolar affective disorder, depressed, mild ([place]) 04/12/2014  . Suicidal ideation 04/12/2014  . Injury of right shoulder and upper arm 02/17/2014  . Left leg pain 06/03/2013  . Right hip pain 06/03/2013  . Migraine with status migrainosus 01/08/2013  . Personality disorder ([place])   . Chronic migraine 05/08/2012  . Contact dermatitis 11/27/2011  . Major depressive disorder, recurrent episode ([employer]) 10/27/2011  . Generalized anxiety disorder 10/27/2011  . 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 Borderline personality disorder ([place]) 10/27/2011  . Right foot pain 09/28/2011  . 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 Ganglion cyst 09/29/2009  . PES PLANUS 07/01/2008  . BIPOLAR DISORDER UNSPECIFIED  06/09/2008    Mariah Milling, OTR/L 05/05/2019, 7:01 PM  [facility] [address] [place], [place], [address] Phone: [phone]   Fax:  [phone]  Name: JAYDA WHITE MRN: [id number] Date of Birth: 06/10/1970

## 2019-05-05 NOTE — Patient Instructions (Signed)
11. Grip Strengthening (Resistive Putty)   Squeeze putty using thumb and all fingers. Repeat 15 times. Do 1-2 sessions per day.   Extension (Assistive Putty)   Roll putty back and forth, being sure to use all fingertips. Repeat 3 times. Do 1-2 sessions per day.  Then pinch as below.   Palmar Pinch Strengthening (Resistive Putty)   Pinch putty between thumb and each fingertip in turn after rolling out      MP Flexion (Resistive Putty)   Bending only at large knuckles, press putty down against thumb. Keep fingertips straight. Pull putty between two hands Repeat 10 times. Do 1-2 sessions per day.   Lateral Pinch Strengthening (Resistive Putty)    Squeeze between thumb and side of each finger in turn. Repeat 10 times. Do 1-2 sessions per day.

## 2019-05-08 ENCOUNTER — Encounter (HOSPITAL_COMMUNITY): Payer: Self-pay | Admitting: Emergency Medicine

## 2019-05-08 ENCOUNTER — Emergency Department (HOSPITAL_COMMUNITY)
Admission: EM | Admit: 2019-05-08 | Discharge: 2019-05-08 | Disposition: A | Discharge status: Home / Self Care | Payer: PPO | Attending: Emergency Medicine | Admitting: Emergency Medicine

## 2019-05-08 ENCOUNTER — Other Ambulatory Visit: Payer: Self-pay

## 2019-05-08 ENCOUNTER — Emergency Department (HOSPITAL_COMMUNITY): Payer: PPO

## 2019-05-08 DIAGNOSIS — Z20822 Contact with and (suspected) exposure to covid-19: Secondary | ICD-10-CM

## 2019-05-08 DIAGNOSIS — R0602 Shortness of breath: Secondary | ICD-10-CM | POA: Diagnosis not present

## 2019-05-08 DIAGNOSIS — R05 Cough: Secondary | ICD-10-CM | POA: Insufficient documentation

## 2019-05-08 DIAGNOSIS — I10 Essential (primary) hypertension: Secondary | ICD-10-CM | POA: Insufficient documentation

## 2019-05-08 DIAGNOSIS — U071 COVID-19: Secondary | ICD-10-CM | POA: Diagnosis not present

## 2019-05-08 DIAGNOSIS — Z79899 Other long term (current) drug therapy: Secondary | ICD-10-CM | POA: Diagnosis not present

## 2019-05-08 DIAGNOSIS — R509 Fever, unspecified: Secondary | ICD-10-CM | POA: Diagnosis not present

## 2019-05-08 LAB — I-STAT BETA HCG BLOOD, ED (MC, WL, AP ONLY): I-stat hCG, quantitative: <5 m[IU]/mL (ref <5)

## 2019-05-08 LAB — CBC WITH DIFFERENTIAL/PLATELET
Abs Immature Granulocytes: 0.02 10*3/uL (ref 0.00–0.07)
Basophils Absolute: 0 10*3/uL (ref 0.0–0.1)
Basophils Relative: 0 %
Eosinophils Absolute: 0 10*3/uL (ref 0.0–0.5)
Eosinophils Relative: 0 %
HCT: 41.7 % (ref 36.0–46.0)
Hemoglobin: 13.4 g/dL (ref 12.0–15.0)
Immature Granulocytes: 0 %
Lymphocytes Relative: 32 %
Lymphs Abs: 1.5 10*3/uL (ref 0.7–4.0)
MCH: 30.4 pg (ref 26.0–34.0)
MCHC: 32.1 g/dL (ref 30.0–36.0)
MCV: 94.6 fL (ref 80.0–100.0)
Monocytes Absolute: 1.1 10*3/uL — ABNORMAL HIGH (ref 0.1–1.0)
Monocytes Relative: 23 %
Neutro Abs: 2.1 10*3/uL (ref 1.7–7.7)
Neutrophils Relative %: 45 %
Platelets: 167 10*3/uL (ref 150–400)
RBC: 4.41 MIL/uL (ref 3.87–5.11)
RDW: 12.5 % (ref 11.5–15.5)
WBC: 4.8 10*3/uL (ref 4.0–10.5)
nRBC: 0 % (ref 0.0–0.2)

## 2019-05-08 LAB — I-STAT CHEM 8, ED
BUN: 10 mg/dL (ref 6–20)
Calcium, Ion: 1.24 mmol/L (ref 1.15–1.40)
Chloride: 105 mmol/L (ref 98–111)
Creatinine, Ser: 1 mg/dL (ref 0.44–1.00)
Glucose, Bld: 92 mg/dL (ref 70–99)
HCT: 39 % (ref 36.0–46.0)
Hemoglobin: 13.3 g/dL (ref 12.0–15.0)
Potassium: 4.1 mmol/L (ref 3.5–5.1)
Sodium: 139 mmol/L (ref 135–145)
TCO2: 26 mmol/L (ref 22–32)

## 2019-05-08 LAB — SARS CORONAVIRUS 2 (TAT 6-24 HRS): SARS Coronavirus 2: POSITIVE — AB

## 2019-05-08 MED ORDER — IBUPROFEN 800 MG PO TABS
800.0000 mg | ORAL_TABLET | Freq: Once | ORAL | Status: AC
  Administered 2019-05-08: 800 mg via ORAL
  Filled 2019-05-08: qty 1

## 2019-05-08 MED ORDER — ALBUTEROL SULFATE HFA 108 (90 BASE) MCG/ACT IN AERS
2.0000 | INHALATION_SPRAY | Freq: Once | RESPIRATORY_TRACT | Status: AC
  Administered 2019-05-08: 13:00:00 2 via RESPIRATORY_TRACT
  Filled 2019-05-08: qty 6.7

## 2019-05-08 NOTE — ED Notes (Signed)
Attempted to contact phlebotomy. Unable to reach anyone at this time.

## 2019-05-08 NOTE — ED Notes (Signed)
Beta hcg sample rejected due to insufficient sample. Nurse notified.

## 2019-05-08 NOTE — ED Provider Notes (Signed)
[facility] DEPT Provider Note   CSN: [id number] Arrival date & time: 05/08/19  5366     History Chief Complaint  Patient presents with  . Fever  . Headache  . Generalized Body Aches  . Cough    [name] is a 49 y.o. female.  The history is provided by the patient. No language interpreter was used.     49 year old female with history of bipolar, ADHD, hypertension presenting with flulike symptoms.  Patient report for the past 2 to 3 days she has not been feeling well.  She endorsed increased generalized fatigue, and weak and yesterday she noticed increased shortness of breath with exertion.  She also noticed a fever as high as 101.5 at home, generalized body aches, headache, increased nonproductive cough, decreased taste and smell, feeling nauseous without vomiting and overall not feeling well.  Symptom is becoming progressively worse.  No complaints of abdominal pain vomiting or diarrhea or dysuria.  Denies alcohol or tobacco use.  She has not had a COVID-19 vaccine yet.  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  No history of asthma.  No specific treatment tried.  Her biggest complaint is shortness of breath and cough.  No prior history of PE or DVT.  Past Medical History:  Diagnosis Date  . Achilles tendinitis   . Achilles tendinitis   . ADHD (attention deficit hyperactivity disorder)   . Allergy   . Arthritis   . Bipolar affective ([place])   . Bipolar disorder ([place])   . Cataracts, bilateral   . Eczema   . Family history of breast cancer   . Family history of colon cancer   . Family history of genetic disease carrier   . Ganglion cyst 09/29/2009   left wrist (2 cyst)  . Hyperprolactinemia ([place])   . Hypertension   . Lipoma   . Migraine   . Personality disorder ([place])   . Pes planus     Patient Active Problem List   Diagnosis Date Noted  . 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 Genetic testing 02/27/2018   . Family history of genetic disease carrier   . Family history of breast cancer   . Family history of colon cancer   . Head injury with loss of consciousness ([place]) 11/20/2017  . Nausea 11/20/2017  . 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 Tremor observed on examination 11/20/2017  . Left shoulder pain 02/18/2017  . MDD (major depressive disorder), recurrent severe, without psychosis ([place]) 12/28/2016  . Muscle strain 09/19/2016  . Right foot injury, subsequent encounter 08/25/2016  . Low back pain 06/27/2016  . Left wrist injury, subsequent encounter 06/27/2016  . Pain of left thumb 10/07/2015  . 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 Strain of left thumb 02/11/2015  . Strain of right forearm 02/11/2015  . Right shoulder pain 12/31/2014  . Injury of right little finger 12/31/2014  . Episodic cluster headache, not intractable 12/02/2014  . 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 Hypersomnia, recurrent 12/02/2014  . Migraine aura, persistent, intractable, with status migrainosus 12/02/2014  . Lower back injury 09/30/2014  . Bipolar I disorder, most recent episode depressed ([place])   . MDD (major depressive disorder), recurrent, severe, with psychosis ([place]) 09/23/2014  . Injury of left index finger 08/20/2014  . Right ankle sprain 06/01/2014  .  Contusion, multiple sites 06/01/2014  . Strain of right gastrocnemius muscle 06/01/2014  . Phonophobia 05/04/2014  . Photophobia of both eyes 05/04/2014  . Emotionally unstable borderline personality disorder ([place]) 05/04/2014  . Nausea with vomiting 05/04/2014  . Mixed bipolar I disorder ([place])   . Bipolar I disorder, most recent episode mixed ([place]) 04/18/2014  . Bipolar affective disorder, depressed, mild ([place]) 04/12/2014  . Suicidal ideation 04/12/2014  . Injury of right shoulder and  upper arm 02/17/2014  . Left leg pain 06/03/2013  . Right hip pain 06/03/2013  . Migraine with status migrainosus 01/08/2013  . Personality disorder ([place])   . Chronic migraine 05/08/2012  . Contact dermatitis 11/27/2011  . Major depressive disorder, recurrent episode ([place]) 10/27/2011  . Generalized anxiety disorder 10/27/2011  . 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 Borderline personality disorder ([place]) 10/27/2011  . Right foot pain 09/28/2011  . 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 Ganglion cyst 09/29/2009  . PES PLANUS 07/01/2008  . BIPOLAR DISORDER UNSPECIFIED 06/09/2008    Past Surgical History:  Procedure Laterality Date  . ANKLE SURGERY  12/88   left   . chest nodule  1990?   rt chest wall nodule removal  . GANGLION CYST EXCISION  2011  . lipoma removal    . right bunioectomy    . SHOULDER SURGERY  01/13/2011   right, partial tear  . tumor resection left thigh       OB History   No obstetric history on file.     Family History  Problem Relation Age of Onset  . Hypertension Mother   . Hyperlipidemia Mother   . Breast cancer Mother 64       genetic testing- CHEK2 likely pathogenic variant  . Heart attack Father   . Heart disease Father   . Hypertension Father   . Bipolar disorder Father   . Diabetes Paternal Grandfather   . Heart disease Maternal Aunt   . Breast cancer Maternal Aunt 65  . Heart disease Maternal Grandmother   . Colon cancer Maternal Grandmother 66  . Colon cancer Maternal Uncle 75  . Lymphoma Maternal Grandfather 32  . Breast cancer Maternal Aunt 65  . Breast cancer Maternal Aunt 35  . Breast cancer Maternal Aunt   . Bone cancer Cousin 54  . Cervical cancer Cousin        Genetic testing- 'was positive for a gene' had a mastectomy    Social History   Tobacco Use  . Smoking status: Never Smoker  . Smokeless tobacco: Never Used   Substance Use Topics  . Alcohol use: No    Alcohol/week: 0.0 standard drinks  . Drug use: No    Home Medications Prior to Admission medications   Medication Sig Start Date End Date Taking? Authorizing Provider  clobetasol cream (TEMOVATE) 0.05 %  08/07/17   [provider]  diclofenac (VOLTAREN) 75 MG EC tablet Take 1 tablet (75 mg total) by mouth 2 (two) times daily. 03/24/19   Hudnall, Sharyn Lull, MD  divalproex (DEPAKOTE ER) 500 MG 24 hr tablet TAKE 2 TABLETS BY MOUTH AT BEDTIME FOR MOOD 02/24/19   Ward Givens, NP  Galcanezumab-gnlm (EMGALITY) 120 MG/ML SOAJ Inject 120 mg into the skin every 30 (thirty) days. 01/02/18   Ward Givens, NP  hydrOXYzine (VISTARIL) 50 MG capsule TK ONE C PO TID PRA 05/18/17   [provider]  lamoTRIgine (LAMICTAL) 150  MG tablet Take 300 mg by mouth at bedtime. 07/27/17   [provider]  levocetirizine (XYZAL) 5 MG tablet Take 5 mg by mouth every evening. 05/12/17   [provider]  lurasidone (LATUDA) 80 MG TABS tablet Take 1 tablet (80 mg total) by mouth daily with supper. For mood control 01/05/17   Money, Lowry Ram, FNP  metoprolol succinate (TOPROL-XL) 100 MG 24 hr tablet Take 1 tablet (100 mg total) by mouth daily. Take with or immediately following a meal. 01/06/17   Money, Lowry Ram, FNP  promethazine (PHENERGAN) 25 MG tablet TK 1 T PO BID FOR 15 DAYS PRN 01/28/19   Dohmeier, Asencion Partridge, MD  SUMAtriptan Succinate Refill 6 MG/0.5ML SOCT Inject 0.5 ml SQ at the onset of migraine. May repeat in 2 hours if needed. 01/28/19   Dohmeier, Asencion Partridge, MD  traZODone (DESYREL) 50 MG tablet Take 1 tablet (50 mg total) by mouth at bedtime as needed for sleep. Patient taking differently: Take 100 mg by mouth.  01/05/17   Money, Lowry Ram, FNP    Allergies    Adhesive [tape], Dilaudid [hydromorphone hcl], Morphine, Penicillins, Percocet [oxycodone-acetaminophen], Prednisone, Provera [medroxyprogesterone acetate], and Ultram [tramadol hcl]   Review of Systems   Review of Systems  All other systems reviewed and are negative.   Physical Exam Updated Vital Signs BP 122/83 (BP Location: Right Arm)   Pulse 79   Temp 100.1 F (37.8 C) (Oral)   Resp 16   LMP 04/24/2019   SpO2 93%   Physical Exam Vitals and nursing note reviewed.  Constitutional:      General: She is not in acute distress.    Appearance: She is well-developed. She is obese.  HENT:     Head: Atraumatic.  Eyes:     Conjunctiva/sclera: Conjunctivae normal.  Cardiovascular:     Rate and Rhythm: Normal rate and regular rhythm.  Pulmonary:     Breath sounds: Rales present.  Abdominal:     Palpations: Abdomen is soft.     Tenderness: There is no abdominal tenderness.  Musculoskeletal:        General: Normal range of motion.     Cervical back: Neck supple. No rigidity.  Skin:    General: Skin is warm.     Findings: No rash.  Neurological:     Mental Status: She is alert and oriented to person, place, and time.     GCS: GCS eye subscore is 4. GCS verbal subscore is 5. GCS motor subscore is 6.  Psychiatric:        Mood and Affect: Mood normal.     ED Results / Procedures / Treatments   Labs (all labs ordered are listed, but only abnormal results are displayed) Labs Reviewed  CBC WITH DIFFERENTIAL/PLATELET - Abnormal; Notable for the following components:      Result Value   Monocytes Absolute 1.1 (*)    All other components within normal limits  SARS CORONAVIRUS 2 (TAT 6-24 HRS)  COMPREHENSIVE METABOLIC PANEL  I-STAT BETA HCG BLOOD, ED ([facility], [facility], [facility] ONLY)  I-STAT CHEM 8, ED    EKG None  ED ECG REPORT   Date: 05/08/2019  Rate: 69  Rhythm: normal sinus rhythm  QRS Axis: normal  Intervals: normal  ST/T Wave abnormalities: nonspecific T wave changes  Conduction Disutrbances:none  Narrative Interpretation:   Old EKG Reviewed: unchanged  I have personally reviewed the EKG tracing and agree with the computerized printout as noted.    Radiology DG Chest  Port 1 View  Result Date: 05/08/2019 CLINICAL DATA:  Pt c/o sore, throat, fever, headache, body aches, cough and nausea that started yesterday. Pt states that her temperature last night got to 101.5 but didn't take anything. Hx of bipolar disorder and HTN. EXAM: PORTABLE CHEST 1 VIEW COMPARISON:  07/15/2016 FINDINGS: Cardiac silhouette is normal in size. No mediastinal or hilar masses or evidence of adenopathy. Clear lungs.  No pleural effusion or pneumothorax. Skeletal structures are grossly intact. IMPRESSION: No active disease. Electronically Signed   By: Lajean Manes M.D.   On: 05/08/2019 10:22    Procedures Procedures (including critical care time)  Medications Ordered in ED Medications  ibuprofen (ADVIL) tablet 800 mg (800 mg Oral Given 05/08/19 1104)    ED Course  I have reviewed the triage vital signs and the nursing notes.  Pertinent labs & imaging results that were available during my care of the patient were reviewed by me and considered in my medical decision making (see chart for details).    MDM Rules/Calculators/A&P                      BP 122/83 (BP Location: Right Arm)   Pulse 79   Temp 100.1 F (37.8 C) (Oral)   Resp 16   LMP 04/24/2019   SpO2 93%   Final Clinical Impression(s) / ED Diagnoses Final diagnoses:  Suspected COVID-19 virus infection    Rx / DC Orders ED Discharge Orders    None     8:56 AM Patient presents with fever cough generalized body aches shortness of breath decrease in taste and smell.  Symptoms concerning for COVID-19 infection.  At rest, O2 sats is 94% on room air, will ambulate patient and check O2 sats.  Work-up initiated.  She does have a mildly elevated temperature of 100.1 currently.  She is not hypotensive.  She is able to speak in complete sentences and appears nontoxic.  12:10 PM Labs are reassuring, COVID-19 test is currently pending, chest x-ray without concerning changes, EKG unremarkable.  At this time  pt is stable for discharge.  Will d/c home with cough medication, albuterol inhaler to use as needed, quarantine instruction along with return precaution.  Pt voice understanding and agrees with plan.    Keiarah Orlowski Purdum was evaluated in Emergency Department on 05/08/2019 for the symptoms described in the history of present illness. She was evaluated in the context of the global COVID-19 pandemic, which necessitated consideration that the patient might be at risk for infection with the SARS-CoV-2 virus that causes COVID-19. Institutional protocols and algorithms that pertain to the evaluation of patients at risk for COVID-19 are in a state of rapid change based on information released by regulatory bodies including the CDC and federal and state organizations. These policies and algorithms were followed during the patient's care in the ED.    Domenic Moras, PA-C 05/08/19 1216    Davonna Belling, MD 05/08/19 [phone]

## 2019-05-08 NOTE — ED Notes (Signed)
Patient ambulated around room and maintained O2 saturation between 92-94%. Patient complaining of nausea and coughing at this time.

## 2019-05-08 NOTE — ED Triage Notes (Signed)
Pt c/o sore, throat, fever, headache, body aches, cough and nausea that started yesterday. Pt states that her temperature last night got to 101.5 but didn't take anything.

## 2019-05-08 NOTE — ED Notes (Signed)
Unsuccessful IV attempt x2.  

## 2019-05-08 NOTE — Discharge Instructions (Signed)
Your symptoms are suggestive of a potential COVID-19 infection.  You may check on your covid test in the next 24 hrs through MyChart, link below.  Follow instruction below.  Return if your condition worsen or if you have other concerns.

## 2019-05-08 NOTE — ED Notes (Signed)
NT unable to obtain blood work. Phlebotomy called.

## 2019-05-09 ENCOUNTER — Ambulatory Visit (HOSPITAL_COMMUNITY)
Admission: RE | Admit: 2019-05-09 | Discharge: 2019-05-09 | Disposition: A | Discharge status: Home / Self Care | Payer: Medicare Other | Source: Ambulatory Visit | Attending: Pulmonary Disease | Admitting: Pulmonary Disease

## 2019-05-09 ENCOUNTER — Telehealth: Payer: Self-pay | Admitting: Unknown Physician Specialty

## 2019-05-09 ENCOUNTER — Other Ambulatory Visit: Payer: Self-pay | Admitting: Unknown Physician Specialty

## 2019-05-09 DIAGNOSIS — Z23 Encounter for immunization: Secondary | ICD-10-CM | POA: Insufficient documentation

## 2019-05-09 DIAGNOSIS — U071 COVID-19: Secondary | ICD-10-CM

## 2019-05-09 MED ORDER — FAMOTIDINE IN NACL 20-0.9 MG/50ML-% IV SOLN: 20.0000 mg | Freq: Once | INTRAVENOUS | Status: DC | PRN

## 2019-05-09 MED ORDER — METHYLPREDNISOLONE SODIUM SUCC 125 MG IJ SOLR: 125.0000 mg | Freq: Once | INTRAMUSCULAR | Status: DC | PRN

## 2019-05-09 MED ORDER — EPINEPHRINE 0.3 MG/0.3ML IJ SOAJ: 0.3000 mg | Freq: Once | INTRAMUSCULAR | Status: DC | PRN

## 2019-05-09 MED ORDER — DIPHENHYDRAMINE HCL 50 MG/ML IJ SOLN: 50.0000 mg | Freq: Once | INTRAMUSCULAR | Status: DC | PRN

## 2019-05-09 MED ORDER — ALBUTEROL SULFATE HFA 108 (90 BASE) MCG/ACT IN AERS: 2.0000 | INHALATION_SPRAY | Freq: Once | RESPIRATORY_TRACT | Status: DC | PRN

## 2019-05-09 MED ORDER — ACETAMINOPHEN 325 MG PO TABS
650.0000 mg | ORAL_TABLET | Freq: Four times a day (QID) | ORAL | Status: DC | PRN
  Administered 2019-05-09: 650 mg via ORAL
  Filled 2019-05-09: qty 2

## 2019-05-09 MED ORDER — SODIUM CHLORIDE 0.9 % IV SOLN: INTRAVENOUS | Status: DC | PRN

## 2019-05-09 MED ORDER — SODIUM CHLORIDE 0.9 % IV SOLN
Freq: Once | INTRAVENOUS | Status: AC
  Filled 2019-05-09: qty 700

## 2019-05-09 NOTE — Progress Notes (Addendum)
  Diagnosis: COVID-19  Physician:wright   Procedure: Covid Infusion [facility] Med: bamlanivimab\etesevimab infusion - Provided patient with bamlanimivab\etesevimab fact sheet for patients, parents and caregivers prior to infusion.  Complications: patient c/o headache after one hour post transfusion. Temperature checked 100.7. Bp , sats, and pulse normal.see flowsheet.  no other complaints. RN Stanton Kidney informed pharmacy Dian Situ, advised RN to give patient tylenol. Patient instructed to monitor temperature at home and advised to call pcp or call 911 if symptoms get worse.   Discharge: Discharged home   [name] 05/09/2019

## 2019-05-09 NOTE — Telephone Encounter (Signed)
  I connected by phone with [name] Mastropietro on 05/09/2019 at 10:50 AM to discuss the potential use of an new treatment for mild to moderate COVID-19 viral infection in non-hospitalized patients.  This patient is a 49 y.o. female that meets the FDA criteria for Emergency Use Authorization of bamlanivimab/etesevimab or casirivimab/imdevimab.  Has a (+) direct SARS-CoV-2 viral test result  Has mild or moderate COVID-19   Is ? 50 years of age and weighs ? 40 kg  Is NOT hospitalized due to COVID-19  Is NOT requiring oxygen therapy or requiring an increase in baseline oxygen flow rate due to COVID-19  Is within 10 days of symptom onset  Has at least one of the high risk factor(s) for progression to severe COVID-19 and/or hospitalization as defined in EUA.  Specific high risk criteria : BMI >/= 35   I have spoken and communicated the following to the patient or parent/caregiver:  1. FDA has authorized the emergency use of bamlanivimab/etesevimab and casirivimab\imdevimab for the treatment of mild to moderate COVID-19 in adults and pediatric patients with positive results of direct SARS-CoV-2 viral testing who are 10 years of age and older weighing at least 40 kg, and who are at high risk for progressing to severe COVID-19 and/or hospitalization.  2. The significant known and potential risks and benefits of bamlanivimab/etesevimab and casirivimab\imdevimab, and the extent to which such potential risks and benefits are unknown.  3. Information on available alternative treatments and the risks and benefits of those alternatives, including clinical trials.  4. Patients treated with bamlanivimab/etesevimab and casirivimab\imdevimab should continue to self-isolate and use infection control measures (e.g., wear mask, isolate, social distance, avoid sharing personal items, clean and disinfect "high touch" surfaces, and frequent handwashing) according to CDC guidelines.   5. The patient or  parent/caregiver has the option to accept or refuse bamlanivimab/etesevimab or casirivimab\imdevimab .  After reviewing this information with the patient, The patient agreed to proceed with receiving the bamlanimivab infusion and will be provided a copy of the Fact sheet prior to receiving the infusion.Kathrine Haddock 05/09/2019 10:50 AM  Symptom onset 3/6

## 2019-05-09 NOTE — Telephone Encounter (Signed)
Called to discuss with patient about Covid symptoms and the use of bamlanivimab, a monoclonal antibody infusion for those with mild to moderate Covid symptoms and at a high risk of hospitalization.  Pt is qualified for this infusion at the [facility] infusion center due to [employer] left to call back

## 2019-05-09 NOTE — Discharge Instructions (Signed)
10 Things You Can Do to Manage Your COVID-19 Symptoms at Home If you have possible or confirmed COVID-19: 1. Stay home from work and school. And stay away from other public places. If you must go out, avoid using any kind of public transportation, ridesharing, or taxis. 2. Monitor your symptoms carefully. If your symptoms get worse, call your healthcare provider immediately. 3. Get rest and stay hydrated. 4. If you have a medical appointment, call the healthcare provider ahead of time and tell them that you have or may have COVID-19. 5. For medical emergencies, call 911 and notify the dispatch personnel that you have or may have COVID-19. 6. Cover your cough and sneezes with a tissue or use the inside of your elbow. 7. Wash your hands often with soap and water for at least 20 seconds or clean your hands with an alcohol-based hand sanitizer that contains at least 60% alcohol. 8. As much as possible, stay in a specific room and away from other people in your home. Also, you should use a separate bathroom, if available. If you need to be around other people in or outside of the home, wear a mask. 9. Avoid sharing personal items with other people in your household, like dishes, towels, and bedding. 10. Clean all surfaces that are touched often, like counters, tabletops, and doorknobs. Use household cleaning sprays or wipes according to the label instructions. [email or web address] 07/31/2018 This information is not intended to replace advice given to you by your health care provider. Make sure you discuss any questions you have with your health care provider. Document Revised: 01/02/2019 Document Reviewed: 01/02/2019 Elsevier Patient Education  [employer]. Patient is asked to monitor BP at home or work, several times per month and return with written values at next office visit. What types of side effects do monoclonal antibody drugs cause?  Common side effects  In general, the more common  side effects caused by monoclonal antibody drugs include: . Allergic reactions, such as hives or itching . Flu-like signs and symptoms, including chills, fatigue, fever, and muscle aches and pains . Nausea, vomiting . Diarrhea . Skin rashes . Low blood pressure   The CDC is recommending patients who receive monoclonal antibody treatments wait at least 90 days before being vaccinated.  Currently, there are no data on the safety and efficacy of mRNA COVID-19 vaccines in persons who received monoclonal antibodies or convalescent plasma as part of COVID-19 treatment. Based on the estimated half-life of such therapies as well as evidence suggesting that reinfection is uncommon in the 90 days after initial infection, vaccination should be deferred for at least 90 days, as a precautionary measure until additional information becomes available, to avoid interference of the antibody treatment with vaccine-induced immune responses.

## 2019-05-12 ENCOUNTER — Ambulatory Visit: Payer: PPO | Admitting: Occupational Therapy

## 2019-05-14 ENCOUNTER — Ambulatory Visit: Payer: PPO | Admitting: Speech Pathology

## 2019-05-18 ENCOUNTER — Other Ambulatory Visit: Payer: Self-pay | Admitting: Family Medicine

## 2019-05-19 DIAGNOSIS — U071 COVID-19: Secondary | ICD-10-CM | POA: Diagnosis not present

## 2019-05-20 ENCOUNTER — Encounter: Payer: PPO | Admitting: Occupational Therapy

## 2019-05-21 ENCOUNTER — Ambulatory Visit: Payer: PPO | Admitting: Family Medicine

## 2019-05-27 ENCOUNTER — Other Ambulatory Visit: Payer: Self-pay

## 2019-05-27 ENCOUNTER — Ambulatory Visit: Payer: PPO | Admitting: Occupational Therapy

## 2019-05-27 DIAGNOSIS — M6281 Muscle weakness (generalized): Secondary | ICD-10-CM

## 2019-05-27 DIAGNOSIS — M25632 Stiffness of left wrist, not elsewhere classified: Secondary | ICD-10-CM

## 2019-05-27 DIAGNOSIS — M25532 Pain in left wrist: Secondary | ICD-10-CM

## 2019-05-27 NOTE — Patient Instructions (Signed)
1. Wrist Extension: Isometric    With left forearm resting palm down on thigh, resist upward movement of hand with other hand. Hold _10___ seconds. Relax. Repeat _5___ times per set.  Do __3__ sessions per day.  2. Wrist Radial Deviation: Isometric    With left forearm resting on thigh, thumb up, use other hand to resist upward movement of hand at wrist. Hold __10__ seconds. Relax. Repeat __5__ times per set.  Do __3__ sessions per day.  3. Walk short distances holding 3 lb dumbbell keeping wrist neutral 1-2 times per day  4. Hold one end of 3 lb dumbbell and slowly turn palm down, and then up keeping wrist neutral, 1-2 times per day.

## 2019-05-27 NOTE — Therapy (Signed)
[facility] [address] [place], [place], [address] Phone: [phone]   Fax:  [phone]  Occupational Therapy Treatment  Patient Details  Name: [name] MRN: [id number] Date of Birth: [birth year] Referring Provider (OT): Dr Barbaraann Barthel   Encounter Date: 05/27/2019  OT End of Session - 05/27/19 0929    Visit Number  5    Number of Visits  7    Date for OT Re-Evaluation  06/07/19    Authorization Type  [employer]...VL:[place]    OT Start Time  0802    OT Stop Time  0845    OT Time Calculation (min)  43 min    Activity Tolerance  Patient tolerated treatment well    Behavior During Therapy  WFL for tasks assessed/performed       Past Medical History:  Diagnosis Date  . Achilles tendinitis   . Achilles tendinitis   . ADHD (attention deficit hyperactivity disorder)   . Allergy   . Arthritis   . Bipolar affective ([place])   . Bipolar disorder ([place])   . Cataracts, bilateral   . Eczema   . Family history of breast cancer   . Family history of colon cancer   . Family history of genetic disease carrier   . Ganglion cyst 09/29/2009   left wrist (2 cyst)  . Hyperprolactinemia ([place])   . Hypertension   . Lipoma   . Migraine   . Personality disorder ([place])   . Pes planus     Past Surgical History:  Procedure Laterality Date  . ANKLE SURGERY  12/88   left   . chest nodule  1990?   rt chest wall nodule removal  . GANGLION CYST EXCISION  2011  . lipoma removal    . right bunioectomy    . SHOULDER SURGERY  01/13/2011   right, partial tear  . tumor resection left thigh      There were no vitals filed for this visit.  Subjective Assessment - 05/27/19 0810    Subjective   I got diagnosed with covid on 05/08/19 but I got the infusion and doing better    Currently in Pain?  Yes    Pain Score  3     Pain Location  Wrist    Pain Orientation  Left    Pain Descriptors / Indicators  Aching    Pain Type   Acute pain    Pain Onset  More than a month ago    Pain Frequency  Intermittent    Aggravating Factors   overuse    Pain Relieving Factors  ice       Pt returns today after quarantine from covid. Pt's grip strength remains the same - pt has not lost strength.  Reviewed previously issued HEP's including putty HEP, coordination HEP, and wrist ROM HEP. Issued isometric ex's for wrist ext, RD, and stabalization ex as well as controlled sup/pron w/ weight to simulate pouring while keeping wrist neutral. Discussed progression of ex's and performing ROM w/o resistance and resistance w/o ROM before combining the two. Also discussed plans to d/c next visit - pt agrees but concerned about pain. Pain however has been chronic but overall has decreased.                     OT Education - 05/27/19 [phone]    Education Details  isometric ex's    Person(s) Educated  Patient    Methods  Explanation;Demonstration;Handout  Comprehension  Verbalized understanding;Returned demonstration       OT Short Term Goals - 05/27/19 0813      OT SHORT TERM GOAL #1   Title  Patient will complete a HEP to improve wrist range of motion    Time  3    Period  Weeks    Status  Achieved      OT SHORT TERM GOAL #2   Title  Patient will demonstrate edema management techniques    Time  3    Period  Weeks    Status  Achieved      OT SHORT TERM GOAL #3   Title  Patient will demonstrate no greater that 3/10 pain with light in hand manipulation skills    Time  3    Period  Weeks    Status  Achieved      OT SHORT TERM GOAL #4   Title  Pt will increase wrist flexion/ extension to 45* or greater for increased functional use.    Baseline  30/30    Time  3    Period  Weeks    Status  Achieved        OT Long Term Goals - 05/27/19 3664      OT LONG TERM GOAL #1   Title  Patient will complete updated HEP    Status  Achieved      OT LONG TERM GOAL #2   Title  Improve grip strength Lt hand by 10  lbs or greater to open jars/containers    Baseline  RUE 65 lbs, LUE 15 lbs    Status  Achieved   29 lbs     OT LONG TERM GOAL #3   Title  Patient will demonstrate reduced time on 9 hole peg test by 10 seconds as evidence of improved coordiantion of hand/wrist/ forearm as needed for functional hand positioning    Baseline  49 sec- left    Status  Achieved   31.85 sec     OT LONG TERM GOAL #4   Title  Patient will report pain no greater than 2/10 when using left hand to assist as non dominant during light household tasks, or ADL    Baseline  6/10    Status  On-going            Plan - 05/27/19 0930    Clinical Impression Statement  Pt has met all STG's and 4/5 LTG's. Pain still fluctuates. Pt has not lost any grip strength since covid (+).    Occupational performance deficits (Please refer to evaluation for details):  ADL's;IADL's;Rest and Sleep;Work    Marketing executive / Function / Physical Skills  ADL;Coordination;Endurance;GMC;UE functional use;Decreased knowledge of precautions;Fascial restriction;Pain;IADL;Flexibility;Decreased knowledge of use of DME;Body mechanics;Dexterity;FMC;Balance;Strength;ROM;Edema    Rehab Potential  Good    OT Frequency  1x / week    OT Duration  6 weeks    OT Treatment/Interventions  Self-care/ADL training;Electrical Stimulation;Iontophoresis;Therapeutic exercise;Patient/family education;Splinting;Compression bandaging;Neuromuscular education;Moist Heat;Traction;Fluidtherapy;Therapeutic activities;Passive range of motion;Manual Therapy;DME and/or AE instruction;Ultrasound;Cryotherapy    Plan  review previously issued HEP, Assess remaining LTG and d/c    Consulted and Agree with Plan of Care  Patient       Patient will benefit from skilled therapeutic intervention in order to improve the following deficits and impairments:   Body Structure / Function / Physical Skills: ADL, Coordination, Endurance, GMC, UE functional use, Decreased knowledge of  precautions, Fascial restriction, Pain, IADL, Flexibility, Decreased knowledge of use of DME,  Body mechanics, Dexterity, FMC, Balance, Strength, ROM, Edema       Visit Diagnosis: Pain in left wrist  Stiffness of left wrist, not elsewhere classified  Muscle weakness (generalized)    Problem List Patient Active Problem List   Diagnosis Date Noted  . 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 Genetic testing 02/27/2018  . Family history of genetic disease carrier   . Family history of breast cancer   . Family history of colon cancer   . Head injury with loss of consciousness ([place]) 11/20/2017  . Nausea 11/20/2017  . 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 Tremor observed on examination 11/20/2017  . Left shoulder pain 02/18/2017  . MDD (major depressive disorder), recurrent severe, without psychosis ([place]) 12/28/2016  . Muscle strain 09/19/2016  . Right foot injury, subsequent encounter 08/25/2016  . Low back pain 06/27/2016  . Left wrist injury, subsequent encounter 06/27/2016  . Pain of left thumb 10/07/2015  . 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 Strain of left thumb 02/11/2015  . Strain of right forearm 02/11/2015  . Right shoulder pain 12/31/2014  . Injury of right little finger 12/31/2014  . Episodic cluster headache, not intractable 12/02/2014  . 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 Hypersomnia, recurrent 12/02/2014  . Migraine aura, persistent, intractable, with status migrainosus 12/02/2014  . Lower back injury 09/30/2014  . Bipolar I disorder, most recent episode depressed ([place])   . MDD (major depressive disorder), recurrent, severe, with psychosis ([place]) 09/23/2014  . Injury of left index finger 08/20/2014  . Right ankle sprain 06/01/2014  . Contusion, multiple sites 06/01/2014  .  Strain of right gastrocnemius muscle 06/01/2014  . Phonophobia 05/04/2014  . Photophobia of both eyes 05/04/2014  . Emotionally unstable borderline personality disorder ([place]) 05/04/2014  . Nausea with vomiting 05/04/2014  . Mixed bipolar I disorder ([place])   . Bipolar I disorder, most recent episode mixed ([place]) 04/18/2014  . Bipolar affective disorder, depressed, mild ([place]) 04/12/2014  . Suicidal ideation 04/12/2014  . Injury of right shoulder and upper arm 02/17/2014  . Left leg pain 06/03/2013  . Right hip pain 06/03/2013  . Migraine with status migrainosus 01/08/2013  . Personality disorder ([place])   . Chronic migraine 05/08/2012  . Contact dermatitis 11/27/2011  . Major depressive disorder, recurrent episode ([place]) 10/27/2011  . Generalized anxiety disorder 10/27/2011  . 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 Borderline personality disorder ([place]) 10/27/2011  . Right foot pain 09/28/2011  . 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 Ganglion cyst 09/29/2009  . PES PLANUS 07/01/2008  . BIPOLAR DISORDER UNSPECIFIED 06/09/2008    Carey Bullocks, OTR/L 05/27/2019, 11:12 AM  [facility] [address] [place], [place], [address] Phone: [phone]   Fax:  [phone]  Name: MICA RAMDASS MRN: [id number] Date of Birth: February 24, 1970

## 2019-05-28 ENCOUNTER — Ambulatory Visit: Payer: PPO | Admitting: Speech Pathology

## 2019-05-28 ENCOUNTER — Encounter: Payer: Self-pay | Admitting: Speech Pathology

## 2019-05-28 DIAGNOSIS — M25532 Pain in left wrist: Secondary | ICD-10-CM | POA: Diagnosis not present

## 2019-05-28 DIAGNOSIS — R41841 Cognitive communication deficit: Secondary | ICD-10-CM

## 2019-05-30 NOTE — Therapy (Signed)
[facility] [address] [place], [place], [address] Phone: [phone]   Fax:  [phone]  Speech Language Pathology Evaluation  Patient Details  Name: [name] MRN: [id number] Date of Birth: April 08, 1970 Referring Provider (SLP): Ward Givens NP   Encounter Date: 05/28/2019  End of Session - 05/30/19 0827    Visit Number  1    Number of Visits  13    Date for SLP Re-Evaluation  07/25/19    SLP Start Time  8264    SLP Stop Time   1233    SLP Time Calculation (min)  46 min    Activity Tolerance  Patient tolerated treatment well       Past Medical History:  Diagnosis Date  . Achilles tendinitis   . Achilles tendinitis   . ADHD (attention deficit hyperactivity disorder)   . Allergy   . Arthritis   . Bipolar affective ([place])   . Bipolar disorder ([place])   . Cataracts, bilateral   . Eczema   . Family history of breast cancer   . Family history of colon cancer   . Family history of genetic disease carrier   . Ganglion cyst 09/29/2009   left wrist (2 cyst)  . Hyperprolactinemia ([place])   . Hypertension   . Lipoma   . Migraine   . Personality disorder ([place])   . Pes planus     Past Surgical History:  Procedure Laterality Date  . ANKLE SURGERY  12/88   left   . chest nodule  1990?   rt chest wall nodule removal  . GANGLION CYST EXCISION  2011  . lipoma removal    . right bunioectomy    . SHOULDER SURGERY  01/13/2011   right, partial tear  . tumor resection left thigh      There were no vitals filed for this visit.  Subjective Assessment - 05/30/19 0811    Subjective  "I have 1 OT visit left"         SLP Evaluation [facility] - 05/30/19 1583      SLP Visit Information   SLP Received On  05/28/19    Referring Provider (SLP)  Trixie Deis NP    Onset Date  Feb 2021    Medical Diagnosis  word finding difficulty/post conussion      Subjective   Patient/Family Stated Goal  "To be able to  remember stuff"      General Information   HPI  [name] is know to this clinician from prior course of ST last year for cognitive linguistic impairments s/p conucssion 09/2017. Most recently, Joyce fell and injurred her UE and reports she hit her head on the refrigerator during the fall. Joyce continues to report difficulty with memory and word finding. She is referred by neurology.    Mobility Status  walks independently      Balance Screen   Has the patient fallen in the past 6 months  Yes    How many times?  1    Has the patient had a decrease in activity level because of a fear of falling?   No    Is the patient reluctant to leave their home because of a fear of falling?   No      Prior Functional Status   Cognitive/Linguistic Baseline  Baseline deficits    Baseline deficit details  --   attention, memory, ADHD, word finding   Type of Home  House  Lives With  Family    Available Support  Family    Vocation  Part time employment      Cognition   Overall Cognitive Status  Impaired/Different from baseline    Area of Impairment  Problem solving;Safety/judgement;Attention;Memory    Current Attention Level  Selective    Safety/Judgement  Decreased awareness of safety    Problem Solving  Difficulty sequencing;Slow processing    Attention  Selective;Alternating    Selective Attention  Impaired    Selective Attention Impairment  Verbal complex;Functional complex    Alternating Attention  Impaired    Alternating Attention Impairment  Verbal basic;Functional basic    Memory  Impaired    Memory Impairment  Retrieval deficit;Storage deficit;Decreased recall of new information    Awareness  Impaired    Awareness Impairment  Anticipatory impairment    Problem Solving  Impaired    Problem Solving Impairment  Verbal complex;Functional complex    Executive Function  Self Monitoring;Self Correcting;Sequencing;Organizing;Reasoning;Decision Making      Auditory Comprehension   Overall  Auditory Comprehension  Impaired    Yes/No Questions  Within Functional Limits    Commands  Impaired    Complex Commands  50-74% accurate    Conversation  Moderately complex    Interfering Components  Attention;Processing speed    EffectiveTechniques  Extra processing time;Pausing;Repetition;Stressing words;Visual/Gestural cues      Reading Comprehension   Reading Status  Within funtional limits      Expression   Primary Mode of Expression  Verbal      Verbal Expression   Overall Verbal Expression  Impaired    Initiation  No impairment    Level of Generative/Spontaneous Verbalization  Conversation    Repetition  No impairment    Naming  Impairment    Responsive  76-100% accurate    Confrontation  75-100% accurate    Convergent  75-100% accurate    Divergent  50-74% accurate    Verbal Errors  Semantic paraphasias;Aware of errors    Pragmatics  No impairment    Interfering Components  Attention      Written Expression   Dominant Hand  Right      Oral Motor/Sensory Function   Overall Oral Motor/Sensory Function  Appears within functional limits for tasks assessed      Motor Speech   Overall Motor Speech  Appears within functional limits for tasks assessed      Standardized Assessments   Standardized Assessments   Cognitive Linguistic Quick Test      Cognitive Linguistic Quick Test (Ages 50-69)   Attention  Mild    Memory  Mild    Executive Function  Moderate    Language  Mild    Visuospatial Skills  Mild    Severity Rating Total  14    Composite Severity Rating  11.6                      SLP Education - 05/30/19 0826    Education Details  areas of impairment; goas    Person(s) Educated  Patient    Methods  Explanation    Comprehension  Verbal cues required;Need further instruction         SLP Long Term Goals - 05/30/19 0254      SLP LONG TERM GOAL #1   Title  Pt will utilize 2 compensatory strategies to recall store lists and daily to do's  with rare min A over 3 sessions    Time  6    Period  Weeks    Status  New      SLP LONG TERM GOAL #2   Title  pt will use anomia compensations successfully in 15 minutes mod complex conversation over 3 therapy sessions    Time  6    Period  Weeks    Status  New      SLP LONG TERM GOAL #3   Title  Pt will utilize compensations for attention, processing and auditory comprehension when being given instructions, medical or insurance information at home and work with rare min A over 3 sessions    Time  6    Period  Weeks    Status  New      SLP LONG TERM GOAL #4   Title  Pt will verbalize anticipatory awareness before making safety decisions at work and household chores with rare min A over 3 sessions    Time  6    Period  Weeks    Status  New       Plan - 05/30/19 6578    Clinical Impression Statement  Tsuyako Jolley is a 49 y.o female with h/o post concussion syndrome and word finding difficulty. She is known to [place] from prior course of ST. Joyce c/o ongoing difficulty with memory and attention since concussion and recent fall where she hit her head. Joyce offers a recent example where she had to go back into a store 3x due to forgetting what her mother asked her to get. 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 The Cognitive Linguistic Quick Test revealed mild to moderate cogntive linguistic impairments in attention, memory, languge, visuospatial skills and executive functions. Noted that Joyce does have h/o ADHD. I  recommend short course of ST (9-13 visits) to maximize carryover of compensatory strategie for cognitive linguistic impairments for safety and QOL as well as success at work.    Speech Therapy Frequency  2x / week    Duration  --   6 weeks or 13 visits   Treatment/Interventions  SLP instruction and feedback;Cueing hierarchy;Environmental controls;Language facilitation;Compensatory techniques;Cognitive  reorganization;Functional tasks;Compensatory strategies;Patient/family education;Multimodal communcation approach;Internal/external aids    Potential to Achieve Goals  Good       Patient will benefit from skilled therapeutic intervention in order to improve the following deficits and impairments:   Cognitive communication deficit    Problem List Patient Active Problem List   Diagnosis Date Noted  . 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 Genetic testing 02/27/2018  . Family history of genetic disease carrier   . Family history of breast cancer   . Family history of colon cancer   . Head injury with loss of consciousness ([place]) 11/20/2017  . Nausea 11/20/2017  . 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 Tremor observed on examination 11/20/2017  . Left shoulder pain 02/18/2017  . MDD (major depressive disorder), recurrent severe, without psychosis ([place]) 12/28/2016  . Muscle strain 09/19/2016  . Right foot injury, subsequent encounter 08/25/2016  . Low back pain 06/27/2016  . Left wrist injury, subsequent encounter 06/27/2016  . Pain of left thumb 10/07/2015  . 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 Strain of left thumb 02/11/2015  . Strain of right forearm 02/11/2015  . Right shoulder pain 12/31/2014  . Injury of right little finger 12/31/2014  .  Episodic cluster headache, not intractable 12/02/2014  . 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 Hypersomnia, recurrent 12/02/2014  . Migraine aura, persistent, intractable, with status migrainosus 12/02/2014  . Lower back injury 09/30/2014  . Bipolar I disorder, most recent episode depressed ([place])   . MDD (major depressive disorder), recurrent, severe, with psychosis ([place]) 09/23/2014  . Injury of left index finger 08/20/2014  . Right ankle sprain  06/01/2014  . Contusion, multiple sites 06/01/2014  . Strain of right gastrocnemius muscle 06/01/2014  . Phonophobia 05/04/2014  . Photophobia of both eyes 05/04/2014  . Emotionally unstable borderline personality disorder ([place]) 05/04/2014  . Nausea with vomiting 05/04/2014  . Mixed bipolar I disorder ([place])   . Bipolar I disorder, most recent episode mixed ([place]) 04/18/2014  . Bipolar affective disorder, depressed, mild ([place]) 04/12/2014  . Suicidal ideation 04/12/2014  . Injury of right shoulder and upper arm 02/17/2014  . Left leg pain 06/03/2013  . Right hip pain 06/03/2013  . Migraine with status migrainosus 01/08/2013  . Personality disorder ([place])   . Chronic migraine 05/08/2012  . Contact dermatitis 11/27/2011  . Major depressive disorder, recurrent episode ([place]) 10/27/2011  . Generalized anxiety disorder 10/27/2011  . 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 Borderline personality disorder ([place]) 10/27/2011  . Right foot pain 09/28/2011  . 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 Ganglion cyst 09/29/2009  . PES PLANUS 07/01/2008  . BIPOLAR DISORDER UNSPECIFIED 06/09/2008    Chrystle Murillo, Annye Rusk MS, CCC-SLP 05/30/2019, 8:37 AM  [facility] [address] [place], [place], [address] Phone: [phone]   Fax:  [phone]  Name: GARY BULTMAN MRN: [id number] Date of Birth: 22-Sep-1970

## 2019-06-03 ENCOUNTER — Encounter: Payer: Self-pay | Admitting: Occupational Therapy

## 2019-06-03 ENCOUNTER — Ambulatory Visit: Payer: PPO | Attending: Family Medicine | Admitting: Occupational Therapy

## 2019-06-03 ENCOUNTER — Other Ambulatory Visit: Payer: Self-pay

## 2019-06-03 DIAGNOSIS — M25532 Pain in left wrist: Secondary | ICD-10-CM

## 2019-06-03 DIAGNOSIS — M6281 Muscle weakness (generalized): Secondary | ICD-10-CM | POA: Diagnosis not present

## 2019-06-03 DIAGNOSIS — R600 Localized edema: Secondary | ICD-10-CM | POA: Diagnosis not present

## 2019-06-03 DIAGNOSIS — M25632 Stiffness of left wrist, not elsewhere classified: Secondary | ICD-10-CM | POA: Insufficient documentation

## 2019-06-03 DIAGNOSIS — R41841 Cognitive communication deficit: Secondary | ICD-10-CM | POA: Diagnosis not present

## 2019-06-03 NOTE — Therapy (Signed)
[facility] [address] [place], [place], [address] Phone: [phone]   Fax:  [phone]  Occupational Therapy Treatment  Patient Details  Name: [name] MRN: [id number] Date of Birth: 04-08-1970 Referring Provider (OT): Dr Barbaraann Barthel   Encounter Date: 06/03/2019  OT End of Session - 06/03/19 0942    Visit Number  6    Number of Visits  7    Date for OT Re-Evaluation  06/07/19    Authorization Type  [employer]...VL:MN    OT Start Time  0932    OT Stop Time  1008    OT Time Calculation (min)  36 min    Activity Tolerance  Patient tolerated treatment well    Behavior During Therapy  WFL for tasks assessed/performed       Past Medical History:  Diagnosis Date  . Achilles tendinitis   . Achilles tendinitis   . ADHD (attention deficit hyperactivity disorder)   . Allergy   . Arthritis   . Bipolar affective ([place])   . Bipolar disorder ([place])   . Cataracts, bilateral   . Eczema   . Family history of breast cancer   . Family history of colon cancer   . Family history of genetic disease carrier   . Ganglion cyst 09/29/2009   left wrist (2 cyst)  . Hyperprolactinemia ([place])   . Hypertension   . Lipoma   . Migraine   . Personality disorder ([place])   . Pes planus     Past Surgical History:  Procedure Laterality Date  . ANKLE SURGERY  12/88   left   . chest nodule  1990?   rt chest wall nodule removal  . GANGLION CYST EXCISION  2011  . lipoma removal    . right bunioectomy    . SHOULDER SURGERY  01/13/2011   right, partial tear  . tumor resection left thigh      There were no vitals filed for this visit.  Subjective Assessment - 06/03/19 0942    Subjective   Pt reports this is her last visit    Currently in Pain?  Yes    Pain Score  3     Pain Location  Wrist    Pain Orientation  Left    Pain Descriptors / Indicators  Aching    Pain Type  Chronic pain    Pain Onset  More than a month  ago    Pain Frequency  Intermittent    Aggravating Factors   movement    Pain Relieving Factors  heat               Treatment: Fluidotherapy x 9 mins to LUE for relief of pain and stiffness. Pt's pain decreased to 1/10. Reviewed previously issued exercises last visit, followed by upgraded putty exercises. Pt simulated work activities: Producer, television/film/video, therapist made reccommendation that pt folds on a surface to minimize wrist pain and fatigue.            OT Education - 06/03/19 1029    Education Details  reveiwed isotmetric and additional exercises issued last visit, upgraded putty ex to red    Person(s) Educated  Patient    Methods  Explanation;Demonstration;Handout    Comprehension  Verbalized understanding;Returned demonstration;Verbal cues required       OT Short Term Goals - 05/27/19 0813      OT SHORT TERM GOAL #1   Title  Patient will complete a HEP to  improve wrist range of motion    Time  3    Period  Weeks    Status  Achieved      OT SHORT TERM GOAL #2   Title  Patient will demonstrate edema management techniques    Time  3    Period  Weeks    Status  Achieved      OT SHORT TERM GOAL #3   Title  Patient will demonstrate no greater that 3/10 pain with light in hand manipulation skills    Time  3    Period  Weeks    Status  Achieved      OT SHORT TERM GOAL #4   Title  Pt will increase wrist flexion/ extension to 45* or greater for increased functional use.    Baseline  30/30    Time  3    Period  Weeks    Status  Achieved        OT Long Term Goals - 06/03/19 1004      OT LONG TERM GOAL #1   Title  Patient will complete updated HEP    Status  Achieved      OT LONG TERM GOAL #2   Title  Improve grip strength Lt hand by 10 lbs or greater to open jars/containers    Baseline  RUE 65 lbs, LUE 15 lbs    Status  Achieved   29 lbs     OT LONG TERM GOAL #3   Title  Patient will demonstrate reduced time on 9 hole peg test  by 10 seconds as evidence of improved coordiantion of hand/wrist/ forearm as needed for functional hand positioning    Baseline  49 sec- left    Status  Achieved   31.85 sec     OT LONG TERM GOAL #4   Title  Patient will report pain no greater than 2/10 when using left hand to assist as non dominant during light household tasks, or ADL    Baseline  6/10    Status  Not Met   Pain 3-4/10 at most, however pain improves with heat, at end of session today pain 1/10, pt's pain is chronic in nature           Plan - 06/03/19 1007    Clinical Impression Statement  Pt demonstrates good overall progress. She met all short term goals and 3/4 long term goals.    Occupational performance deficits (Please refer to evaluation for details):  ADL's;IADL's;Rest and Sleep;Work    Marketing executive / Function / Physical Skills  ADL;Coordination;Endurance;GMC;UE functional use;Decreased knowledge of precautions;Fascial restriction;Pain;IADL;Flexibility;Decreased knowledge of use of DME;Body mechanics;Dexterity;FMC;Balance;Strength;ROM;Edema    Rehab Potential  Good    OT Frequency  1x / week    OT Duration  6 weeks    OT Treatment/Interventions  Self-care/ADL training;Electrical Stimulation;Iontophoresis;Therapeutic exercise;Patient/family education;Splinting;Compression bandaging;Neuromuscular education;Moist Heat;Traction;Fluidtherapy;Therapeutic activities;Passive range of motion;Manual Therapy;DME and/or AE instruction;Ultrasound;Cryotherapy    Plan  d/c OT    Consulted and Agree with Plan of Care  Patient       Patient will benefit from skilled therapeutic intervention in order to improve the following deficits and impairments:   Body Structure / Function / Physical Skills: ADL, Coordination, Endurance, GMC, UE functional use, Decreased knowledge of precautions, Fascial restriction, Pain, IADL, Flexibility, Decreased knowledge of use of DME, Body mechanics, Dexterity, FMC, Balance, Strength, ROM, Edema      OCCUPATIONAL THERAPY DISCHARGE SUMMARY  Current functional level related to goals / functional outcomes:  Pt met 7/8 goals and she demonstrates good overall progress.   Remaining deficits: Mild wrist pain, decreased strength   Education / Equipment: Pt was educated regarding activity modification to minimize pain with folding clothes, and HRP.Pt demonstrates understanding. Plan: Patient agrees to discharge.  Patient goals were partially met. Patient is being discharged due to meeting the stated rehab goals.  ?????      Visit Diagnosis: Pain in left wrist  Stiffness of left wrist, not elsewhere classified  Muscle weakness (generalized)  Localized edema    Problem List Patient Active Problem List   Diagnosis Date Noted  . 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 Genetic testing 02/27/2018  . Family history of genetic disease carrier   . Family history of breast cancer   . Family history of colon cancer   . Head injury with loss of consciousness ([place]) 11/20/2017  . Nausea 11/20/2017  . 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 Tremor observed on examination 11/20/2017  . Left shoulder pain 02/18/2017  . MDD (major depressive disorder), recurrent severe, without psychosis ([place]) 12/28/2016  . Muscle strain 09/19/2016  . Right foot injury, subsequent encounter 08/25/2016  . Low back pain 06/27/2016  . Left wrist injury, subsequent encounter 06/27/2016  . Pain of left thumb 10/07/2015  . 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 Strain of left thumb 02/11/2015  . Strain of right forearm 02/11/2015  . Right shoulder pain 12/31/2014  . Injury of right little finger 12/31/2014  . Episodic cluster headache, not intractable 12/02/2014  . 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 Hypersomnia,  recurrent 12/02/2014  . Migraine aura, persistent, intractable, with status migrainosus 12/02/2014  . Lower back injury 09/30/2014  . Bipolar I disorder, most recent episode depressed ([place])   . MDD (major depressive disorder), recurrent, severe, with psychosis ([place]) 09/23/2014  . Injury of left index finger 08/20/2014  . Right ankle sprain 06/01/2014  . Contusion, multiple sites 06/01/2014  . Strain of right gastrocnemius muscle 06/01/2014  . Phonophobia 05/04/2014  . Photophobia of both eyes 05/04/2014  . Emotionally unstable borderline personality disorder ([place]) 05/04/2014  . Nausea with vomiting 05/04/2014  . Mixed bipolar I disorder ([place])   . Bipolar I disorder, most recent episode mixed ([place]) 04/18/2014  . Bipolar affective disorder, depressed, mild ([place]) 04/12/2014  . Suicidal ideation 04/12/2014  . Injury of right shoulder and upper arm 02/17/2014  . Left leg pain 06/03/2013  . Right hip pain 06/03/2013  . Migraine with status migrainosus 01/08/2013  . Personality disorder ([place])   . Chronic migraine 05/08/2012  . Contact dermatitis 11/27/2011  . Major depressive disorder, recurrent episode ([place]) 10/27/2011  . Generalized anxiety disorder 10/27/2011  . 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 Borderline personality disorder ([place]) 10/27/2011  . Right foot pain 09/28/2011  . 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 Ganglion cyst 09/29/2009  . PES PLANUS 07/01/2008  . BIPOLAR DISORDER UNSPECIFIED 06/09/2008    Tevan Marian 06/03/2019, 10:31 AM  [facility] [address] [place], [place], [address] Phone: [phone]   Fax:  [phone]  Name: [name] MRN: [id number] Date of Birth: 1970-10-17

## 2019-06-04 ENCOUNTER — Ambulatory Visit: Payer: PPO | Admitting: Family Medicine

## 2019-06-04 ENCOUNTER — Encounter: Payer: Self-pay | Admitting: Family Medicine

## 2019-06-04 VITALS — BP 106/78 | Ht 70.0 in | Wt 250.0 lb

## 2019-06-04 DIAGNOSIS — M25532 Pain in left wrist: Secondary | ICD-10-CM | POA: Diagnosis not present

## 2019-06-04 NOTE — Progress Notes (Signed)
PCP: Aretta Nip, MD  Subjective:   HPI: Patient is a 49 y.o. female here for left wrist injury.  2/10: Patient reports yesterday she tripped over the stand of a fan and sustained a [place] injury to her left wrist. Immediate pain, some swelling of wrist and into left thumb. Difficulty moving both the wrist and the thumb. Some bruising of her knee as well but this doesn't bother her currently. No skin changes of hand/wrist.  2/22: Patient returns with persistent pain of dorsal left wrist. Wearing thumb spica brace and taking ibuprofen 86m tid without much benefit. Swelling has improved some but pain still sharp and severe. No new injuries.  5/5: Patient reports her wrist feels much better. Still with a little soreness dorsally, bothers if she tries to push up from sitting. Done with therapy and not limiting her at all. She had COVID about a month ago and feels more fatigued still but otherwise doing well.  Past Medical History:  Diagnosis Date  . Achilles tendinitis   . Achilles tendinitis   . ADHD (attention deficit hyperactivity disorder)   . Allergy   . Arthritis   . Bipolar affective ([place]   . Bipolar disorder ([place]   . Cataracts, bilateral   . Eczema   . Family history of breast cancer   . Family history of colon cancer   . Family history of genetic disease carrier   . Ganglion cyst 09/29/2009   left wrist (2 cyst)  . Hyperprolactinemia ([place]   . Hypertension   . Lipoma   . Migraine   . Personality disorder ([place]   . Pes planus     Current Outpatient Medications on File Prior to Visit  Medication Sig Dispense Refill  . CALCIUM PO Take 1 tablet by mouth in the morning and at bedtime.    . Cholecalciferol (VITAMIN D3 PO) Take 1 capsule by mouth daily.    . clobetasol cream (TEMOVATE) 0.05 %   3  . Cyanocobalamin (B-12 PO) Take 1 tablet by mouth daily.    . diclofenac (VOLTAREN) 75 MG EC tablet TAKE 1 TABLET(75 MG) BY MOUTH TWICE DAILY 60 tablet 1  .  divalproex (DEPAKOTE ER) 500 MG 24 hr tablet TAKE 2 TABLETS BY MOUTH AT BEDTIME FOR MOOD (Patient taking differently: Take 1,000 mg by mouth daily. TAKE 2 TABLETS BY MOUTH AT BEDTIME FOR MOOD) 60 tablet 5  . Galcanezumab-gnlm (EMGALITY) 120 MG/ML SOAJ Inject 120 mg into the skin every 30 (thirty) days. 1 mL 11  . hydrOXYzine (VISTARIL) 50 MG capsule Take 50 mg by mouth in the morning and at bedtime.   2  . lamoTRIgine (LAMICTAL) 150 MG tablet Take 300 mg by mouth at bedtime.  1  . LATUDA 20 MG TABS tablet Take 20 mg by mouth every morning.    .Marland Kitchenlevocetirizine (XYZAL) 5 MG tablet Take 5 mg by mouth every evening.  1  . lurasidone (LATUDA) 80 MG TABS tablet Take 1 tablet (80 mg total) by mouth daily with supper. For mood control 30 tablet 0  . metoprolol succinate (TOPROL-XL) 100 MG 24 hr tablet Take 1 tablet (100 mg total) by mouth daily. Take with or immediately following a meal. 30 tablet 0  . Multiple Vitamin (MULTIVITAMIN WITH MINERALS) TABS tablet Take 1 tablet by mouth daily.    . Multiple Vitamins-Minerals (ZINC PO) Take 1 tablet by mouth daily.    . promethazine (PHENERGAN) 25 MG tablet TK 1 T PO BID FOR 15  DAYS PRN (Patient taking differently: Take 25 mg by mouth 2 (two) times daily as needed for nausea or vomiting. TK 1 T PO BID FOR 15 DAYS PRN) 30 tablet 0  . SUMAtriptan Succinate Refill 6 MG/0.5ML SOCT Inject 0.5 ml SQ at the onset of migraine. May repeat in 2 hours if needed. (Patient taking differently: Inject 0.5 mLs into the skin as needed (migraine). Inject 0.5 ml SQ at the onset of migraine. May repeat in 2 hours if needed.) 0.5 mL 5  . traZODone (DESYREL) 100 MG tablet Take 100 mg by mouth at bedtime.    . traZODone (DESYREL) 50 MG tablet Take 1 tablet (50 mg total) by mouth at bedtime as needed for sleep. 30 tablet 0   Current Facility-Administered Medications on File Prior to Visit  Medication Dose Route Frequency Provider Last Rate Last Admin  . methylPREDNISolone acetate  (DEPO-MEDROL) injection 40 mg  40 mg Intra-articular Once Hatsuko Bizzarro, Sharyn Lull, MD      . valproate (DEPACON) 1,000 mg in dextrose 5 % 50 mL IVPB  1,000 mg Intravenous Once Dohmeier, Asencion Partridge, MD        Past Surgical History:  Procedure Laterality Date  . ANKLE SURGERY  12/88   left   . chest nodule  1990?   rt chest wall nodule removal  . GANGLION CYST EXCISION  2011  . lipoma removal    . right bunioectomy    . SHOULDER SURGERY  01/13/2011   right, partial tear  . tumor resection left thigh      Allergies  Allergen Reactions  . Adhesive [Tape] Itching and Rash    Also reacted to Steri Strips and Band-Aids.  . Dilaudid [Hydromorphone Hcl] Itching  . Morphine Nausea And Vomiting  . Penicillins Hives    Has patient had a PCN reaction causing immediate rash, facial/tongue/throat swelling, SOB or lightheadedness with hypotension: YES Has patient had a PCN reaction causing severe rash involving mucus membranes or skin necrosis: NO Has patient had a PCN reaction that required hospitalization NO Has patient had a PCN reaction occurring within the last 10 years:NO If all of the above answers are "NO", then may proceed with Cephalosporin use.  Marland Kitchen Percocet [Oxycodone-Acetaminophen] Itching  . Prednisone Hives  . Provera [Medroxyprogesterone Acetate] Other (See Comments)    Causes manic episodes  . Ultram [Tramadol Hcl] Itching    Social History   Socioeconomic History  . Marital status: Single    Spouse name: Not on file  . Number of children: 0  . Years of education: Not on file  . Highest education level: Not on file  Occupational History    Employer: [employer]  Tobacco Use  . Smoking status: Never Smoker  . Smokeless tobacco: Never Used  Substance and Sexual Activity  . Alcohol use: No    Alcohol/week: 0.0 standard drinks  . Drug use: No  . Sexual activity: Never    Birth control/protection: Pill  Other Topics Concern  . Not on file  Social History Narrative   Caffeine  2  sodas daily, 1 cup coffee daily.   Social Determinants of Health   Financial Resource Strain:   . Difficulty of Paying Living Expenses:   Food Insecurity:   . Worried About Charity fundraiser in the Last Year:   . Arboriculturist in the Last Year:   Transportation Needs:   . Film/video editor (Medical):   Marland Kitchen Lack of Transportation (Non-Medical):   Physical Activity:   .  Days of Exercise per Week:   . Minutes of Exercise per Session:   Stress:   . Feeling of Stress :   Social Connections:   . Frequency of Communication with Friends and Family:   . Frequency of Social Gatherings with Friends and Family:   . Attends Religious Services:   . Active Member of Clubs or Organizations:   . Attends Archivist Meetings:   Marland Kitchen Marital Status:   Intimate Partner Violence:   . Fear of Current or Ex-Partner:   . Emotionally Abused:   Marland Kitchen Physically Abused:   . Sexually Abused:     Family History  Problem Relation Age of Onset  . Hypertension Mother   . Hyperlipidemia Mother   . Breast cancer Mother 50       genetic testing- CHEK2 likely pathogenic variant  . Heart attack Father   . Heart disease Father   . Hypertension Father   . Bipolar disorder Father   . Diabetes Paternal Grandfather   . Heart disease Maternal Aunt   . Breast cancer Maternal Aunt 65  . Heart disease Maternal Grandmother   . Colon cancer Maternal Grandmother 66  . Colon cancer Maternal Uncle 75  . Lymphoma Maternal Grandfather 64  . Breast cancer Maternal Aunt 65  . Breast cancer Maternal Aunt 35  . Breast cancer Maternal Aunt   . Bone cancer Cousin 84  . Cervical cancer Cousin        Genetic testing- 'was positive for a gene' had a mastectomy    BP 106/78   Ht 5' 10"  (1.778 m)   Wt 250 lb (113.4 kg)   BMI 35.87 kg/m   Review of Systems: See HPI above.     Objective:  Physical Exam:  Gen: NAD, comfortable in exam room  Left wrist/hand: No deformity, swelling, bruising. FROM with  5/5 strength. Minimal TTP dorsal wrist joint.  No other tenderness. NVI distally.   Assessment & Plan:  1. Left wrist/hand injury - radiographs and MRI reassuring.  Much improved clinically with initial bracing and occupational therapy.  Return to work full duty without restrictions.  Tylenol if needed.  F/u prn.

## 2019-06-24 ENCOUNTER — Ambulatory Visit: Payer: PPO | Admitting: Speech Pathology

## 2019-06-26 ENCOUNTER — Ambulatory Visit: Payer: PPO

## 2019-06-26 ENCOUNTER — Other Ambulatory Visit: Payer: Self-pay

## 2019-06-26 DIAGNOSIS — R41841 Cognitive communication deficit: Secondary | ICD-10-CM

## 2019-06-26 DIAGNOSIS — M25532 Pain in left wrist: Secondary | ICD-10-CM | POA: Diagnosis not present

## 2019-06-26 NOTE — Therapy (Signed)
[facility] [address] [place], [place], [address] Phone: [phone]   Fax:  [phone]  Speech Language Pathology Treatment  Patient Details  Name: [name] MRN: [id number] Date of Birth: [birth year] Referring Provider (SLP): Trixie Deis NP   Encounter Date: 06/26/2019  End of Session - 06/26/19 1708    Visit Number  2    Number of Visits  13    Date for SLP Re-Evaluation  07/25/19    SLP Start Time  0109    SLP Stop Time   1445    SLP Time Calculation (min)  41 min    Activity Tolerance  Patient tolerated treatment well       Past Medical History:  Diagnosis Date  . Achilles tendinitis   . Achilles tendinitis   . ADHD (attention deficit hyperactivity disorder)   . Allergy   . Arthritis   . Bipolar affective ([place])   . Bipolar disorder ([place])   . Cataracts, bilateral   . Eczema   . Family history of breast cancer   . Family history of colon cancer   . Family history of genetic disease carrier   . Ganglion cyst 09/29/2009   left wrist (2 cyst)  . Hyperprolactinemia ([place])   . Hypertension   . Lipoma   . Migraine   . Personality disorder ([place])   . Pes planus     Past Surgical History:  Procedure Laterality Date  . ANKLE SURGERY  12/88   left   . chest nodule  1990?   rt chest wall nodule removal  . GANGLION CYST EXCISION  2011  . lipoma removal    . right bunioectomy    . SHOULDER SURGERY  01/13/2011   right, partial tear  . tumor resection left thigh      There were no vitals filed for this visit.  Subjective Assessment - 06/26/19 1416    Subjective  "I tried to do an email at work and they said I did it wrong."    Currently in Pain?  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 Told pt that she needs to check with her supervisor or a trusted employee prior to her shift for any new pertinent information after not working for >4-5 days. 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 SLP collaborted with pt how she could compensate with customers to allow for the extra time when verifying SKU numbers. Pt sometimes jokes with customers when this occurs, SLP affirmed this strategy for pt an doffered another script that states pt communicataes that she is taking extra time to provide customer with  the best possible service.        Assessment / Recommendations / Plan   Plan  Continue with current plan of care      Progression Toward Goals   Progression toward goals  Progressing toward goals       SLP Education - 06/26/19 1708    Education Details  compensations for work    [employer]) Educated  Patient    Methods  Explanation    Comprehension  Verbalized understanding         SLP Long Term Goals - 06/26/19 1727      SLP LONG TERM GOAL #1   Title  Pt will utilize 2 compensatory strategies to recall store lists and daily to do's with rare min A over 3 sessions    Time  6    Period  Weeks    Status  On-going      SLP LONG TERM GOAL #2   Title  pt will use anomia compensations successfully in 15 minutes mod complex conversation over 3 therapy sessions    Time  6    Period  Weeks    Status  On-going      SLP LONG TERM GOAL #3   Title  Pt will utilize compensations for attention, processing and auditory  comprehension when being given instructions, medical or insurance information at home and work with rare min A over 3 sessions    Time  6    Period  Weeks    Status  On-going      SLP LONG TERM GOAL #4   Title  Pt will verbalize anticipatory awareness before making safety decisions at work and household chores with rare min A over 3 sessions    Time  6    Period  Weeks    Status  On-going       Plan - 06/26/19 1711    Clinical Impression Statement  [name] is a 49 y.o female pt with c/o ongoing difficulty with memory and attention since concussion and recent fall where she hit her head. I  recommend short course of ST (9-13 visits) to maximize carryover of compensatory strategie for cognitive linguistic impairments for safety and QOL as well as success at work.    Speech Therapy Frequency  2x / week    Duration  --   6 weeks or 13 visits   Treatment/Interventions  SLP instruction and feedback;Cueing hierarchy;Environmental controls;Language facilitation;Compensatory techniques;Cognitive reorganization;Functional tasks;Compensatory strategies;Patient/family education;Multimodal communcation approach;Internal/external aids    Potential to Achieve Goals  Good       Patient will benefit from skilled therapeutic intervention in order to improve the following deficits and impairments:   Cognitive communication deficit    Problem List Patient Active Problem List   Diagnosis Date Noted  . 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 Genetic testing 02/27/2018  . Family history of genetic disease carrier   . Family history of breast cancer   . Family history of colon cancer   . Head injury with loss of consciousness ([place]) 11/20/2017  . Nausea 11/20/2017  . 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 Tremor observed on examination 11/20/2017  . Left shoulder pain 02/18/2017  .  MDD (major depressive disorder), recurrent severe, without psychosis ([place]) 12/28/2016  . Muscle strain 09/19/2016  . Right foot injury, subsequent encounter 08/25/2016  . Low back pain 06/27/2016  . Left wrist  injury, subsequent encounter 06/27/2016  . Pain of left thumb 10/07/2015  . 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 Strain of left thumb 02/11/2015  . Strain of right forearm 02/11/2015  . Right shoulder pain 12/31/2014  . Injury of right little finger 12/31/2014  . Episodic cluster headache, not intractable 12/02/2014  . 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 Hypersomnia, recurrent 12/02/2014  . Migraine aura, persistent, intractable, with status migrainosus 12/02/2014  . Lower back injury 09/30/2014  . Bipolar I disorder, most recent episode depressed ([place])   . MDD (major depressive disorder), recurrent, severe, with psychosis ([place]) 09/23/2014  . Injury of left index finger 08/20/2014  . Right ankle sprain 06/01/2014  . Contusion, multiple sites 06/01/2014  . Strain of right gastrocnemius muscle 06/01/2014  . Phonophobia 05/04/2014  . Photophobia of both eyes 05/04/2014  . Emotionally unstable borderline personality disorder ([place]) 05/04/2014  . Nausea with vomiting 05/04/2014  . Mixed bipolar I disorder ([place])   . Bipolar I disorder, most recent episode mixed ([place]) 04/18/2014  . Bipolar affective disorder, depressed, mild ([place]) 04/12/2014  . Suicidal ideation 04/12/2014  . Injury of right shoulder and upper arm 02/17/2014  . Left leg pain 06/03/2013  . Right hip pain 06/03/2013  . Migraine with status migrainosus 01/08/2013  . Personality disorder ([place])   . Chronic migraine 05/08/2012  . Contact dermatitis 11/27/2011  . Major depressive disorder, recurrent episode ([place]) 10/27/2011  . Generalized anxiety disorder 10/27/2011  . ADHD (attention deficit hyperactivity disorder), 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   . Borderline personality disorder ([place]) 10/27/2011  . Right foot pain 09/28/2011  . 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 Ganglion cyst 09/29/2009  . PES PLANUS 07/01/2008  . BIPOLAR DISORDER UNSPECIFIED 06/09/2008    [facility] ,MS, CCC-SLP  06/26/2019, 5:28 PM  [facility] [address] [place], [place], [address] Phone: [phone]   Fax:  [phone]   Name: [name] MRN: [id number] Date of Birth: Jul 18, 1970

## 2019-06-26 NOTE — Patient Instructions (Signed)
   Ask your supervisor or another employee if there was anything new or changed since you were there last time.  Use humor or another way to communicate that you are taking your time to give your customers the best possible experience that you can.

## 2019-07-03 ENCOUNTER — Ambulatory Visit: Payer: PPO | Attending: Family Medicine | Admitting: Speech Pathology

## 2019-07-03 ENCOUNTER — Other Ambulatory Visit: Payer: Self-pay

## 2019-07-03 DIAGNOSIS — R41841 Cognitive communication deficit: Secondary | ICD-10-CM | POA: Diagnosis not present

## 2019-07-03 NOTE — Patient Instructions (Signed)
Get a notebook and pill box (for when you need to take your Vistaril on the go).   Start making your own to do list (you write down your errands and lists instead of your mom). It's ok if your mom helps you remember what to put on the list, but I want you to write it down yourself. This helps encode things in your memory.   Self-advocacy or self-disclosure is when you share with the other person about your concussion and some of the impacts (slower processing, difficulty focusing, trouble with memory). It can help you relax by taking off some the pressure of the expectations to do things quickly. You can also let the other person know what they can do to help you. Examples: 1. Ask your mom to get your attention first before telling you something important, or ask her to talk to you in the same room, face to face. 2. Let a customer know that it would help you if they could read the SKU number out loud, or reassure them that you are taking your time so that you can make sure you are accurate and handling their transaction appropriately.  3. If you miss something in conversation, say, "I'm sorry, sometimes I have trouble processing information. What you're saying is important to me, and I really want to make sure I understand. Would you mind repeating."  4. "Would you mind writing that down for me?"

## 2019-07-03 NOTE — Therapy (Signed)
[facility] [address] [place], [place], [address] Phone: [phone]   Fax:  [phone]  Speech Language Pathology Treatment  Patient Details  Name: [name] MRN: [id number] Date of Birth: 1970-07-15 Referring Provider (SLP): Trixie Deis NP   Encounter Date: 07/03/2019  End of Session - 07/03/19 1318    Visit Number  3    Number of Visits  13    Date for SLP Re-Evaluation  07/25/19    SLP Start Time  0849    SLP Stop Time   0930    SLP Time Calculation (min)  41 min    Activity Tolerance  Patient tolerated treatment well       Past Medical History:  Diagnosis Date  . Achilles tendinitis   . Achilles tendinitis   . ADHD (attention deficit hyperactivity disorder)   . Allergy   . Arthritis   . Bipolar affective ([place])   . Bipolar disorder ([place])   . Cataracts, bilateral   . Eczema   . Family history of breast cancer   . Family history of colon cancer   . Family history of genetic disease carrier   . Ganglion cyst 09/29/2009   left wrist (2 cyst)  . Hyperprolactinemia ([place])   . Hypertension   . Lipoma   . Migraine   . Personality disorder ([place])   . Pes planus     Past Surgical History:  Procedure Laterality Date  . ANKLE SURGERY  12/88   left   . chest nodule  1990?   rt chest wall nodule removal  . GANGLION CYST EXCISION  2011  . lipoma removal    . right bunioectomy    . SHOULDER SURGERY  01/13/2011   right, partial tear  . tumor resection left thigh      There were no vitals filed for this visit.  Subjective Assessment - 07/03/19 0854    Subjective  "I have to pause because I can't get the words out."            ADULT SLP TREATMENT - 07/03/19 1313      General Information   Behavior/Cognition  Alert;Cooperative;Pleasant mood      Treatment Provided   Treatment provided  Cognitive-Linquistic      Pain Assessment   Pain Assessment  No/denies pain       Cognitive-Linquistic Treatment   Treatment focused on  Cognition    Skilled Treatment  Patient arrived with list of errands and store list made for her by her mom. "I already forgot to put the letter in the mailbox." Pt also reports forgetting to take her Vistaril during the day. She has an event set in her calendar but has not noticed whether she gets an alert. SLP assisted pt with setting a recurring alarm for her noon dose. Pt added notebook and pill holder to her list of errands. SLP worked with pt to problem solve communication situations at work and home that are frustrating for her, such as when a customer looses patience because she is moving too slowly, or when she doesn't process information her mother tells her. See pt instructions for more information.      Assessment / Recommendations / Plan   Plan  Continue with current plan of care      Progression Toward Goals   Progression toward goals  Progressing toward goals       SLP Education - 07/03/19 1318    Education Details  compensations for conversations, processing, recall    Person(s) Educated  Patient    Methods  Explanation;Handout    Comprehension  Verbalized understanding         SLP Long Term Goals - 07/03/19 1319      SLP LONG TERM GOAL #1   Title  Pt will utilize 2 compensatory strategies to recall store lists and daily to do's with rare min A over 3 sessions    Time  5    Period  Weeks    Status  On-going      SLP LONG TERM GOAL #2   Title  pt will use anomia compensations successfully in 15 minutes mod complex conversation over 3 therapy sessions    Time  5    Period  Weeks    Status  On-going      SLP LONG TERM GOAL #3   Title  Pt will utilize compensations for attention, processing and auditory comprehension when being given instructions, medical or insurance information at home and work with rare min A over 3 sessions    Time  5    Period  Weeks    Status  On-going      SLP LONG TERM GOAL #4    Title  Pt will verbalize anticipatory awareness before making safety decisions at work and household chores with rare min A over 3 sessions    Time  5    Period  Weeks    Status  On-going       Plan - 07/03/19 1318    Clinical Impression Statement  [name] is a 49 y.o female pt with c/o ongoing difficulty with memory and attention since concussion and recent fall where she hit her head. I  recommend short course of ST (9-13 visits) to maximize carryover of compensatory strategie for cognitive linguistic impairments for safety and QOL as well as success at work.    Speech Therapy Frequency  2x / week    Duration  --   6 weeks or 13 visits   Treatment/Interventions  SLP instruction and feedback;Cueing hierarchy;Environmental controls;Language facilitation;Compensatory techniques;Cognitive reorganization;Functional tasks;Compensatory strategies;Patient/family education;Multimodal communcation approach;Internal/external aids    Potential to Achieve Goals  Good       Patient will benefit from skilled therapeutic intervention in order to improve the following deficits and impairments:   Cognitive communication deficit    Problem List Patient Active Problem List   Diagnosis Date Noted  . 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 Genetic testing 02/27/2018  . Family history of genetic disease carrier   . Family history of breast cancer   . Family history of colon cancer   . Head injury with loss of consciousness ([place]) 11/20/2017  . Nausea 11/20/2017  . 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 Tremor observed on examination 11/20/2017  . Left shoulder pain 02/18/2017  . MDD (major depressive disorder), recurrent severe, without psychosis ([place]) 12/28/2016  . Muscle strain 09/19/2016  . Right foot injury, subsequent encounter 08/25/2016  . Low back pain 06/27/2016  . Left  wrist injury, subsequent encounter 06/27/2016  . Pain of left thumb 10/07/2015  . 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 Strain of left thumb 02/11/2015  . Strain of right forearm 02/11/2015  . Right shoulder pain 12/31/2014  . Injury of right little finger 12/31/2014  . Episodic cluster headache, not intractable 12/02/2014  . Chronic  paroxysmal hemicrania, not intractable 12/02/2014  . 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 Hypersomnia, recurrent 12/02/2014  . Migraine aura, persistent, intractable, with status migrainosus 12/02/2014  . Lower back injury 09/30/2014  . Bipolar I disorder, most recent episode depressed ([place])   . MDD (major depressive disorder), recurrent, severe, with psychosis ([place]) 09/23/2014  . Injury of left index finger 08/20/2014  . Right ankle sprain 06/01/2014  . Contusion, multiple sites 06/01/2014  . Strain of right gastrocnemius muscle 06/01/2014  . Phonophobia 05/04/2014  . Photophobia of both eyes 05/04/2014  . Emotionally unstable borderline personality disorder ([place]) 05/04/2014  . Nausea with vomiting 05/04/2014  . Mixed bipolar I disorder ([place])   . Bipolar I disorder, most recent episode mixed ([place]) 04/18/2014  . Bipolar affective disorder, depressed, mild ([place]) 04/12/2014  . Suicidal ideation 04/12/2014  . Injury of right shoulder and upper arm 02/17/2014  . Left leg pain 06/03/2013  . Right hip pain 06/03/2013  . Migraine with status migrainosus 01/08/2013  . Personality disorder ([place])   . Chronic migraine 05/08/2012  . Contact dermatitis 11/27/2011  . Major depressive disorder, recurrent episode ([place]) 10/27/2011  . Generalized anxiety disorder 10/27/2011  . 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 Borderline personality disorder ([place]) 10/27/2011  . Right foot pain 09/28/2011  . 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 Ganglion cyst 09/29/2009   . PES PLANUS 07/01/2008  . BIPOLAR DISORDER UNSPECIFIED 06/09/2008   Deneise Lever, [place], [place] E Marriana Hibberd 07/03/2019, 1:20 PM  [facility] [address] [place], [place], [address] Phone: [phone]   Fax:  [phone]   Name: [name] MRN: [id number] Date of Birth: 10-19-1970

## 2019-07-07 ENCOUNTER — Other Ambulatory Visit: Payer: Self-pay

## 2019-07-07 ENCOUNTER — Ambulatory Visit: Payer: PPO | Admitting: Speech Pathology

## 2019-07-07 ENCOUNTER — Encounter: Payer: Self-pay | Admitting: Speech Pathology

## 2019-07-07 DIAGNOSIS — R41841 Cognitive communication deficit: Secondary | ICD-10-CM

## 2019-07-07 NOTE — Therapy (Signed)
[facility] [address] [place], [place], [address] Phone: [phone]   Fax:  [phone]  Speech Language Pathology Treatment  Patient Details  Name: [name] MRN: [id number] Date of Birth: 11-Mar-1970 Referring Provider (SLP): Trixie Deis NP   Encounter Date: 07/07/2019  End of Session - 07/07/19 4562    Visit Number  4    Number of Visits  13    Date for SLP Re-Evaluation  07/25/19    SLP Start Time  0847    SLP Stop Time   0933    SLP Time Calculation (min)  46 min       Past Medical History:  Diagnosis Date  . Achilles tendinitis   . Achilles tendinitis   . ADHD (attention deficit hyperactivity disorder)   . Allergy   . Arthritis   . Bipolar affective ([place])   . Bipolar disorder ([place])   . Cataracts, bilateral   . Eczema   . Family history of breast cancer   . Family history of colon cancer   . Family history of genetic disease carrier   . Ganglion cyst 09/29/2009   left wrist (2 cyst)  . Hyperprolactinemia ([place])   . Hypertension   . Lipoma   . Migraine   . Personality disorder ([place])   . Pes planus     Past Surgical History:  Procedure Laterality Date  . ANKLE SURGERY  12/88   left   . chest nodule  1990?   rt chest wall nodule removal  . GANGLION CYST EXCISION  2011  . lipoma removal    . right bunioectomy    . SHOULDER SURGERY  01/13/2011   right, partial tear  . tumor resection left thigh      There were no vitals filed for this visit.  Subjective Assessment - 07/07/19 0858    Subjective  "I worked 7 hours and 45 minutes yesterday, which is too  much for me"    Currently in Pain?  Yes    Pain Score  2     Pain Location  Leg    Pain Orientation  Right;Left    Pain Descriptors / Indicators  Sore    Pain Type  Acute pain    Pain Onset  In the past 7 days    Pain Frequency  Intermittent            ADULT SLP TREATMENT - 07/07/19 0902      General Information   Behavior/Cognition  Alert;Cooperative;Pleasant mood;Distractible;Requires cueing      Treatment Provided   Treatment provided  Cognitive-Linquistic      Cognitive-Linquistic Treatment   Treatment focused on  Cognition;Patient/family/caregiver education    Skilled Treatment  [name] brought in note book which she used 1x and was successful checking off to do list. We generated a list of goals for today and this week in her notebook with rare min A. [name] reports success using noon timer to recall noon meds. Ongoing training in compensations for attending and processing verbal instructions and conversations. [name] easlily distracted by ST session next door due to loud volume and required frequent verbal cues to her train of thought. With questioning cues, [name] verbalized awareness that she is distracted at work by babies crying and loud conversations near by. Instructed her to use humor to  let her customer know that she got distracted by the noise and needs an extra second or two. She had near fall at home,  tripping over a trash can near her chair and near the escalator at work. Generated strategy of scanning her environment and the floor before entering to check for safety hazards. [name] reports she tries to do this at work as she runs into racks.       Assessment / Recommendations / Plan   Plan  Continue with current plan of care      Progression Toward Goals   Progression toward goals  Progressing toward goals       SLP Education - 07/07/19 0950    Education Details  compensations for conversations, verbal instructions, recalling to do list         SLP Long Term Goals - 07/07/19 0951      SLP LONG TERM GOAL #1   Title  Pt will utilize 2 compensatory strategies to recall store lists and daily to do's with rare min A over 3 sessions    Time  4    Period  Weeks    Status  On-going      SLP LONG TERM GOAL #2   Title  pt will use anomia compensations successfully in 15 minutes mod complex  conversation over 3 therapy sessions    Time  4    Period  Weeks    Status  On-going      SLP LONG TERM GOAL #3   Title  Pt will utilize compensations for attention, processing and auditory comprehension when being given instructions, medical or insurance information at home and work with rare min A over 3 sessions    Time  4    Period  Weeks    Status  On-going      SLP LONG TERM GOAL #4   Title  Pt will verbalize anticipatory awareness before making safety decisions at work and household chores with rare min A over 3 sessions    Time  5    Period  Weeks    Status  On-going       Plan - 07/07/19 0951    Clinical Impression Statement  [name] is a 49 y.o female pt with c/o ongoing difficulty with memory and attention since concussion and recent fall where she hit her head. I  recommend short course of ST (9-13 visits) to maximize carryover of compensatory strategie for cognitive linguistic impairments for safety and QOL as well as success at work.    Speech Therapy Frequency  2x / week    Duration  --   6 weeks or 13 visits   Treatment/Interventions  SLP instruction and feedback;Cueing hierarchy;Environmental controls;Language facilitation;Compensatory techniques;Cognitive reorganization;Functional tasks;Compensatory strategies;Patient/family education;Multimodal communcation approach;Internal/external aids    Potential to Achieve Goals  Good       Patient will benefit from skilled therapeutic intervention in order to improve the following deficits and impairments:   Cognitive communication deficit    Problem List Patient Active Problem List   Diagnosis Date Noted  . 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 Genetic testing 02/27/2018  . Family history of genetic disease carrier   . Family history of breast cancer   . Family history of colon cancer   . Head injury with loss of consciousness ([place]) 11/20/2017  . Nausea 11/20/2017  . Ataxia  11/20/2017  . 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 Tremor observed on examination 11/20/2017  . Left shoulder pain 02/18/2017  . MDD (major depressive disorder), recurrent severe, without psychosis (  [place]) 12/28/2016  . Muscle strain 09/19/2016  . Right foot injury, subsequent encounter 08/25/2016  . Low back pain 06/27/2016  . Left wrist injury, subsequent encounter 06/27/2016  . Pain of left thumb 10/07/2015  . 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 Strain of left thumb 02/11/2015  . Strain of right forearm 02/11/2015  . Right shoulder pain 12/31/2014  . Injury of right little finger 12/31/2014  . Episodic cluster headache, not intractable 12/02/2014  . 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 Hypersomnia, recurrent 12/02/2014  . Migraine aura, persistent, intractable, with status migrainosus 12/02/2014  . Lower back injury 09/30/2014  . Bipolar I disorder, most recent episode depressed ([place])   . MDD (major depressive disorder), recurrent, severe, with psychosis ([place]) 09/23/2014  . Injury of left index finger 08/20/2014  . Right ankle sprain 06/01/2014  . Contusion, multiple sites 06/01/2014  . Strain of right gastrocnemius muscle 06/01/2014  . Phonophobia 05/04/2014  . Photophobia of both eyes 05/04/2014  . Emotionally unstable borderline personality disorder ([place]) 05/04/2014  . Nausea with vomiting 05/04/2014  . Mixed bipolar I disorder ([place])   . Bipolar I disorder, most recent episode mixed ([place]) 04/18/2014  . Bipolar affective disorder, depressed, mild ([place]) 04/12/2014  . Suicidal ideation 04/12/2014  . Injury of right shoulder and upper arm 02/17/2014  . Left leg pain 06/03/2013  . Right hip pain 06/03/2013  . Migraine with status migrainosus 01/08/2013  . Personality disorder ([place])   . Chronic migraine 05/08/2012  . Contact dermatitis  11/27/2011  . Major depressive disorder, recurrent episode ([place]) 10/27/2011  . Generalized anxiety disorder 10/27/2011  . 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 Borderline personality disorder ([place]) 10/27/2011  . Right foot pain 09/28/2011  . 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 Ganglion cyst 09/29/2009  . PES PLANUS 07/01/2008  . BIPOLAR DISORDER UNSPECIFIED 06/09/2008    Braydn Carneiro, Annye Rusk MS, CCC-SLP 07/07/2019, 9:53 AM  [facility] [address] [place], [place], [address] Phone: [phone]   Fax:  [phone]   Name: JOVIE SWANNER MRN: [id number] Date of Birth: January 29, 1971

## 2019-07-07 NOTE — Patient Instructions (Signed)
   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  Limit background noise when you need to concentrate  Ask customers to spell out email slowly the first time  Bring in your therapy notebook and include your OT notes and exercises if you can find them  Good job using an alarm for noon meds  Be aware of how distracted you are by background noise  How to tell people to talk to help you pay attention to them:  Get the persons attention before you speak  Use eye contact and face the person you are speaking to  Be in close proximity to the person you are speaking to  Turn down any noise in the environment such as the TV, walk away from loud appliances, air conditioners, fans, dish washers etc

## 2019-07-09 DIAGNOSIS — I10 Essential (primary) hypertension: Secondary | ICD-10-CM | POA: Diagnosis not present

## 2019-07-09 DIAGNOSIS — F319 Bipolar disorder, unspecified: Secondary | ICD-10-CM | POA: Diagnosis not present

## 2019-07-09 DIAGNOSIS — G43001 Migraine without aura, not intractable, with status migrainosus: Secondary | ICD-10-CM | POA: Diagnosis not present

## 2019-07-09 DIAGNOSIS — F3130 Bipolar disorder, current episode depressed, mild or moderate severity, unspecified: Secondary | ICD-10-CM | POA: Diagnosis not present

## 2019-07-10 ENCOUNTER — Other Ambulatory Visit: Payer: Self-pay

## 2019-07-10 ENCOUNTER — Ambulatory Visit: Payer: PPO | Admitting: Speech Pathology

## 2019-07-10 DIAGNOSIS — R41841 Cognitive communication deficit: Secondary | ICD-10-CM | POA: Diagnosis not present

## 2019-07-10 NOTE — Therapy (Signed)
[facility] [address] [place], [place], [address] Phone: [phone]   Fax:  [phone]  Speech Language Pathology Treatment  Patient Details  Name: [name] MRN: [id number] Date of Birth: 1970-11-22 Referring Provider (SLP): Trixie Deis NP   Encounter Date: 07/10/2019   End of Session - 07/10/19 1043    Visit Number 5    Number of Visits 13    Date for SLP Re-Evaluation 07/25/19    SLP Start Time 0846    SLP Stop Time  0927    SLP Time Calculation (min) 41 min    Activity Tolerance Patient tolerated treatment well           Past Medical History:  Diagnosis Date  . Achilles tendinitis   . Achilles tendinitis   . ADHD (attention deficit hyperactivity disorder)   . Allergy   . Arthritis   . Bipolar affective ([place])   . Bipolar disorder ([place])   . Cataracts, bilateral   . Eczema   . Family history of breast cancer   . Family history of colon cancer   . Family history of genetic disease carrier   . Ganglion cyst 09/29/2009   left wrist (2 cyst)  . Hyperprolactinemia ([place])   . Hypertension   . Lipoma   . Migraine   . Personality disorder ([place])   . Pes planus     Past Surgical History:  Procedure Laterality Date  . ANKLE SURGERY  12/88   left   . chest nodule  1990?   rt chest wall nodule removal  . GANGLION CYST EXCISION  2011  . lipoma removal    . right bunioectomy    . SHOULDER SURGERY  01/13/2011   right, partial tear  . tumor resection left thigh      There were no vitals filed for this visit.   Subjective Assessment - 07/10/19 0850    Subjective "I worked yesterday and it was kind of a disaster."                 ADULT SLP TREATMENT - 07/10/19 1042      General Information   Behavior/Cognition Alert;Cooperative;Pleasant mood;Distractible;Requires cueing      Treatment Provided   Treatment provided Cognitive-Linquistic      Pain Assessment   Pain Assessment  No/denies pain      Cognitive-Linquistic Treatment   Treatment focused on Cognition;Patient/family/caregiver education    Skilled Treatment Patient forgot her notebook and phone today. "I went back for the chocolate milk but I forgot the notebook." SLP suggested pt keep a bag or basket on her washing machine (where she keeps her keys) to collect items she needs when leaving the house. 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 SLP worked with pt on compensations and role played customer interactions with pt typing email addresses on laptop. Pt required mod cues initially to request pt break up information ("can you spell the first part of your email address?"), fading to rare min A. Pt appropriately requested clarification of some letters when necessary. SLP also suggested pt could keep a notepad and request customers write their email address to assist her. Pt reports sensory hyperstimulation with light and sound; discussed with pt opportunities to use her work breaks for mental break to reduce overstimulation.       Assessment / Recommendations / Plan   Plan Continue with current plan of care      Progression Toward Goals  Progression toward goals Progressing toward goals            SLP Education - 07/10/19 1042    Education Details breaks to reduce cognitive fatigue, compensations for processing verbal information    Person(s) Educated Patient    Methods Explanation;Demonstration;Verbal cues    Comprehension Verbalized understanding;Returned demonstration;Verbal cues required;Need further instruction              SLP Long Term Goals - 07/10/19 0855      SLP LONG TERM GOAL #1   Title Pt will utilize 2 compensatory strategies to recall store lists and daily to do's with rare min A over 3 sessions    Time 4    Period Weeks    Status On-going      SLP LONG TERM GOAL #2   Title pt will use anomia compensations successfully in 15 minutes mod complex conversation  over 3 therapy sessions    Time 4    Period Weeks    Status On-going      SLP LONG TERM GOAL #3   Title Pt will utilize compensations for attention, processing and auditory comprehension when being given instructions, medical or insurance information at home and work with rare min A over 3 sessions    Time 4    Period Weeks    Status On-going      SLP LONG TERM GOAL #4   Title Pt will verbalize anticipatory awareness before making safety decisions at work and household chores with rare min A over 3 sessions    Time 4    Period Weeks    Status On-going            Plan - 07/10/19 1043    Clinical Impression Statement [name] is a 49 y.o female pt with c/o ongoing difficulty with memory and attention since concussion and recent fall where she hit her head. I  recommend short course of ST (9-13 visits) to maximize carryover of compensatory strategie for cognitive linguistic impairments for safety and QOL as well as success at work.    Speech Therapy Frequency 2x / week    Duration --   6 weeks or 13 visits   Treatment/Interventions SLP instruction and feedback;Cueing hierarchy;Environmental controls;Language facilitation;Compensatory techniques;Cognitive reorganization;Functional tasks;Compensatory strategies;Patient/family education;Multimodal communcation approach;Internal/external aids    Potential to Achieve Goals Good           Patient will benefit from skilled therapeutic intervention in order to improve the following deficits and impairments:   Cognitive communication deficit    Problem List Patient Active Problem List   Diagnosis Date Noted  . 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 Genetic testing 02/27/2018  . Family history of genetic disease carrier   . Family history of breast cancer   . Family history of colon cancer   . Head injury with loss of consciousness ([place]) 11/20/2017  . Nausea 11/20/2017  . 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 Tremor observed on examination 11/20/2017  . Left shoulder pain 02/18/2017  . MDD (major depressive disorder), recurrent severe, without psychosis ([place]) 12/28/2016  . Muscle strain 09/19/2016  . Right foot injury, subsequent encounter 08/25/2016  . Low back pain 06/27/2016  . Left wrist injury, subsequent encounter 06/27/2016  . Pain of left thumb 10/07/2015  . 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 Strain of left thumb 02/11/2015  . Strain of  right forearm 02/11/2015  . Right shoulder pain 12/31/2014  . Injury of right little finger 12/31/2014  . Episodic cluster headache, not intractable 12/02/2014  . 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 Hypersomnia, recurrent 12/02/2014  . Migraine aura, persistent, intractable, with status migrainosus 12/02/2014  . Lower back injury 09/30/2014  . Bipolar I disorder, most recent episode depressed ([place])   . MDD (major depressive disorder), recurrent, severe, with psychosis ([place]) 09/23/2014  . Injury of left index finger 08/20/2014  . Right ankle sprain 06/01/2014  . Contusion, multiple sites 06/01/2014  . Strain of right gastrocnemius muscle 06/01/2014  . Phonophobia 05/04/2014  . Photophobia of both eyes 05/04/2014  . Emotionally unstable borderline personality disorder ([place]) 05/04/2014  . Nausea with vomiting 05/04/2014  . Mixed bipolar I disorder ([place])   . Bipolar I disorder, most recent episode mixed ([place]) 04/18/2014  . Bipolar affective disorder, depressed, mild ([place]) 04/12/2014  . Suicidal ideation 04/12/2014  . Injury of right shoulder and upper arm 02/17/2014  . Left leg pain 06/03/2013  . Right hip pain 06/03/2013  . Migraine with status migrainosus 01/08/2013  . Personality disorder ([place])   . Chronic migraine 05/08/2012  . Contact dermatitis 11/27/2011  .  Major depressive disorder, recurrent episode ([place]) 10/27/2011  . Generalized anxiety disorder 10/27/2011  . 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 Borderline personality disorder ([place]) 10/27/2011  . Right foot pain 09/28/2011  . 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 Ganglion cyst 09/29/2009  . PES PLANUS 07/01/2008  . BIPOLAR DISORDER UNSPECIFIED 06/09/2008   Deneise Lever, [place], CCC-SLP Speech-Language Pathologist  Aliene Altes 07/10/2019, 10:45 AM  [facility] [address] [place], [place], [address] Phone: [phone]   Fax:  [phone]   Name: RUBYE STROHMEYER MRN: [id number] Date of Birth: 03/23/1970

## 2019-07-10 NOTE — Patient Instructions (Signed)
When you ask for someone's email address, ask in a way that slows them down or breaks it into pieces:  "Can you spell the first part of your email address?"  "Could you give me your email address, one letter at at time?"  This gives you the opportunity to pause them and request a repeat if you missed something.   It's ok if you go slower than your coworkers; in the long run, going slower will save you time because you'll be less likely to make a mistake.   On your breaks: make it restful! Let your brain pause for a while. Try to reduce the stimulation- if the break room is noisy, can you find a quiet place to sit, maybe outdoors or near the lockers. If you're alone in the break room, turn off some of the lights. Do something that makes feel relaxed.   Put a basket or canvas bag on the washing machine by your keys. As you get to go somewhere, put anything you want to take with you in the basket or bag. Try to keep everything together in one place. You'll be more likely to remember

## 2019-07-15 ENCOUNTER — Ambulatory Visit: Payer: PPO | Admitting: Speech Pathology

## 2019-07-16 ENCOUNTER — Ambulatory Visit: Payer: PPO | Admitting: Speech Pathology

## 2019-07-16 ENCOUNTER — Other Ambulatory Visit: Payer: Self-pay

## 2019-07-16 DIAGNOSIS — R41841 Cognitive communication deficit: Secondary | ICD-10-CM

## 2019-07-16 NOTE — Patient Instructions (Signed)
   Neuropsychologist - 2-4 hour test that goes very in depth into what is going on with your cognition Including memory, attention, auditory processing, problem solving, decision making, cognitive flexibility, visual processing   Seeing a neuropsychologist after a concussion can be helpful in coping with symptoms of concussion and understanding what is going on with your brain  It takes a long time to get an appointment with a neuropsychologist   Set a timer for 15 minutes, just work for 15 minutes at a time on your room  Clutter is not good for your brain injury  Decision making is affected by brain injury as well as bi polar

## 2019-07-16 NOTE — Therapy (Signed)
[facility] [address] [place], [place], [address] Phone: [phone]   Fax:  [phone]  Speech Language Pathology Treatment  Patient Details  Name: [name] MRN: [id number] Date of Birth: 03-12-1970 Referring Provider (SLP): Trixie Deis NP   Encounter Date: 07/16/2019   End of Session - 07/16/19 1215    Visit Number 6    Number of Visits 13    Date for SLP Re-Evaluation 07/25/19    SLP Start Time 1017    SLP Stop Time  1100    SLP Time Calculation (min) 43 min    Activity Tolerance Patient tolerated treatment well           Past Medical History:  Diagnosis Date  . Achilles tendinitis   . Achilles tendinitis   . ADHD (attention deficit hyperactivity disorder)   . Allergy   . Arthritis   . Bipolar affective ([place])   . Bipolar disorder ([place])   . Cataracts, bilateral   . Eczema   . Family history of breast cancer   . Family history of colon cancer   . Family history of genetic disease carrier   . Ganglion cyst 09/29/2009   left wrist (2 cyst)  . Hyperprolactinemia ([place])   . Hypertension   . Lipoma   . Migraine   . Personality disorder ([place])   . Pes planus     Past Surgical History:  Procedure Laterality Date  . ANKLE SURGERY  12/88   left   . chest nodule  1990?   rt chest wall nodule removal  . GANGLION CYST EXCISION  2011  . lipoma removal    . right bunioectomy    . SHOULDER SURGERY  01/13/2011   right, partial tear  . tumor resection left thigh      There were no vitals filed for this visit.   Subjective Assessment - 07/16/19 1154    Subjective "I had a melt down at work"    Currently in Pain? Yes    Pain Score 5     Pain Location Head    Pain Descriptors / Indicators Headache    Pain Type Acute pain    Pain Onset In the past 7 days    Pain Frequency Occasional    Pain Relieving Factors medication however she forgot to take it                 ADULT SLP  TREATMENT - 07/16/19 1155      General Information   Behavior/Cognition Alert;Cooperative;Pleasant mood;Distractible;Requires cueing      Treatment Provided   Treatment provided Cognitive-Linquistic      Cognitive-Linquistic Treatment   Treatment focused on Cognition;Patient/family/caregiver education    Skilled Treatment Pt enters with notebook, mini note book and phone.  She reports depressive epidose since a "meltdown" at work. Last week she worked 4 days in a row then came home and mowed her yard, resulting in near fall. We discussed decision making impairment due to concussion, and affected by bi polar as well. Educated [name] that when she does potentially dangerous tasks when fatigued, he attention is worse and she is at risk for injury.   [name]'s boss has encouraged her to use a notebook when getting customers emails and addresses. Inconsistent carryover of strategies for attention and memory. [name] reports being stressed by the clutter in her room and is overwhelmed by the thought of cleaning it. I educated [name] that clutter is not beneficial to  her cognitive impairments and instructed her to set a timer and start cleaning it for 15 minutes only 1-2x a day when she is off.       Assessment / Recommendations / Plan   Plan Continue with current plan of care      Progression Toward Goals   Progression toward goals Not progressing toward goals (comment)   bi polar disorder/depression affecitng carryover           SLP Education - 07/16/19 1213    Education Details use timer and work for short periods of timem on clearing clutter in your room    Person(s) Educated Patient    Methods Explanation;Verbal cues;Handout    Comprehension Verbalized understanding;Need further instruction              SLP Long Term Goals - 07/16/19 1214      SLP LONG TERM GOAL #1   Title Pt will utilize 2 compensatory strategies to recall store lists and daily to do's with rare min A over 3 sessions    Time 3     Period Weeks    Status On-going      SLP LONG TERM GOAL #2   Title pt will use anomia compensations successfully in 15 minutes mod complex conversation over 3 therapy sessions    Time 3    Period Weeks    Status On-going      SLP LONG TERM GOAL #3   Title Pt will utilize compensations for attention, processing and auditory comprehension when being given instructions, medical or insurance information at home and work with rare min A over 3 sessions    Time 3    Period Weeks    Status On-going      SLP LONG TERM GOAL #4   Title Pt will verbalize anticipatory awareness before making safety decisions at work and household chores with rare min A over 3 sessions    Time 3    Period Weeks    Status On-going            Plan - 07/16/19 1214    Clinical Impression Statement [name] is a 49 y.o female pt with c/o ongoing difficulty with memory and attention since concussion and recent fall where she hit her head. I  recommend short course of ST (9-13 visits) to maximize carryover of compensatory strategie for cognitive linguistic impairments for safety and QOL as well as success at work.    Speech Therapy Frequency 2x / week    Duration --   6 weeks or 13 visits   Treatment/Interventions SLP instruction and feedback;Cueing hierarchy;Environmental controls;Language facilitation;Compensatory techniques;Cognitive reorganization;Functional tasks;Compensatory strategies;Patient/family education;Multimodal communcation approach;Internal/external aids    Potential to Achieve Goals Good    Potential Considerations Co-morbidities           Patient will benefit from skilled therapeutic intervention in order to improve the following deficits and impairments:   Cognitive communication deficit    Problem List Patient Active Problem List   Diagnosis Date Noted  . 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 Genetic testing 02/27/2018  . Family history of genetic  disease carrier   . Family history of breast cancer   . Family history of colon cancer   . Head injury with loss of consciousness ([place]) 11/20/2017  . Nausea 11/20/2017  . 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 Tremor observed on examination 11/20/2017  .  Left shoulder pain 02/18/2017  . MDD (major depressive disorder), recurrent severe, without psychosis ([place]) 12/28/2016  . Muscle strain 09/19/2016  . Right foot injury, subsequent encounter 08/25/2016  . Low back pain 06/27/2016  . Left wrist injury, subsequent encounter 06/27/2016  . Pain of left thumb 10/07/2015  . 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 Strain of left thumb 02/11/2015  . Strain of right forearm 02/11/2015  . Right shoulder pain 12/31/2014  . Injury of right little finger 12/31/2014  . Episodic cluster headache, not intractable 12/02/2014  . 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 Hypersomnia, recurrent 12/02/2014  . Migraine aura, persistent, intractable, with status migrainosus 12/02/2014  . Lower back injury 09/30/2014  . Bipolar I disorder, most recent episode depressed ([place])   . MDD (major depressive disorder), recurrent, severe, with psychosis ([place]) 09/23/2014  . Injury of left index finger 08/20/2014  . Right ankle sprain 06/01/2014  . Contusion, multiple sites 06/01/2014  . Strain of right gastrocnemius muscle 06/01/2014  . Phonophobia 05/04/2014  . Photophobia of both eyes 05/04/2014  . Emotionally unstable borderline personality disorder ([place]) 05/04/2014  . Nausea with vomiting 05/04/2014  . Mixed bipolar I disorder ([place])   . Bipolar I disorder, most recent episode mixed ([place]) 04/18/2014  . Bipolar affective disorder, depressed, mild ([place]) 04/12/2014  . Suicidal ideation 04/12/2014  . Injury of right shoulder and upper arm 02/17/2014  . Left  leg pain 06/03/2013  . Right hip pain 06/03/2013  . Migraine with status migrainosus 01/08/2013  . Personality disorder ([place])   . Chronic migraine 05/08/2012  . Contact dermatitis 11/27/2011  . Major depressive disorder, recurrent episode ([place]) 10/27/2011  . Generalized anxiety disorder 10/27/2011  . 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 Borderline personality disorder ([place]) 10/27/2011  . Right foot pain 09/28/2011  . 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 Ganglion cyst 09/29/2009  . PES PLANUS 07/01/2008  . BIPOLAR DISORDER UNSPECIFIED 06/09/2008    Eion Timbrook, Annye Rusk MS, CCC-SLP 07/16/2019, 12:16 PM  [facility] [address] [place], [place], [address] Phone: [phone]   Fax:  [phone]   Name: [name] MRN: [id number] Date of Birth: 05/31/1970

## 2019-07-17 ENCOUNTER — Ambulatory Visit: Payer: PPO | Admitting: Speech Pathology

## 2019-07-17 DIAGNOSIS — R41841 Cognitive communication deficit: Secondary | ICD-10-CM | POA: Diagnosis not present

## 2019-07-17 NOTE — Patient Instructions (Signed)
We've identified some strategies that may be helpful for you:  -Using a notebook at work to write down information or have customers write their email addresses/information -Making a checklist or to-do list for the day to help you stay focused and to reduce the feeling of being overwhelmed. -Using a timer for brief periods to help you focus on a task.   It's great that you are reading and reviewing the information in your notebook: the next step is to put it into practice.  -Keep your notebook with you at work, not in your locker. -Make a checklist like we did for organizing your room and for working on your flower arrangements.  -Work on your room list at home. Pick ONE TASK from the list, set a timer, and work on it. If you get reminded of something else you need to do, write it on the list but DO NOT CHANGE TASK.

## 2019-07-17 NOTE — Therapy (Signed)
[facility] [address] [place], [place], [address] Phone: [phone]   Fax:  [phone]  Speech Language Pathology Treatment  Patient Details  Name: [name] MRN: [id number] Date of Birth: 1970-03-07 Referring Provider (SLP): Trixie Deis NP   Encounter Date: 07/17/2019   End of Session - 07/17/19 1050    Visit Number 7    Number of Visits 13    Date for SLP Re-Evaluation 07/25/19    SLP Start Time 0847    SLP Stop Time  0930    SLP Time Calculation (min) 43 min    Activity Tolerance Patient tolerated treatment well           Past Medical History:  Diagnosis Date  . Achilles tendinitis   . Achilles tendinitis   . ADHD (attention deficit hyperactivity disorder)   . Allergy   . Arthritis   . Bipolar affective ([place])   . Bipolar disorder ([place])   . Cataracts, bilateral   . Eczema   . Family history of breast cancer   . Family history of colon cancer   . Family history of genetic disease carrier   . Ganglion cyst 09/29/2009   left wrist (2 cyst)  . Hyperprolactinemia ([place])   . Hypertension   . Lipoma   . Migraine   . Personality disorder ([place])   . Pes planus     Past Surgical History:  Procedure Laterality Date  . ANKLE SURGERY  12/88   left   . chest nodule  1990?   rt chest wall nodule removal  . GANGLION CYST EXCISION  2011  . lipoma removal    . right bunioectomy    . SHOULDER SURGERY  01/13/2011   right, partial tear  . tumor resection left thigh      There were no vitals filed for this visit.   Subjective Assessment - 07/17/19 0853    Subjective "I called out on Wednesday."                 ADULT SLP TREATMENT - 07/17/19 1045      General Information   Behavior/Cognition Alert;Cooperative;Distractible;Requires cueing      Treatment Provided   Treatment provided Cognitive-Linquistic      Pain Assessment   Pain Assessment No/denies pain       Cognitive-Linquistic Treatment   Treatment focused on Cognition;Patient/family/caregiver education    Skilled Treatment Patient again discussing her "meltdown" at work on Monday. SLP reinforced using strategies at work as her managers have expressed support for her to do this. She required redirection throughout session to divert from tangential/perseverative topics. She continues to report feeling "overwhelmed," and unable to organize her room. She did not set a timer and organize 15 minutes. SLP worked with pt to generate lists with action steps, breaking down large tasks into smaller ones. Pt reports if she starts a job, she will get distracted and start something else. SLP told pt to use her checklist started in session today; choose one task and set a timer working only on that task. If she thinks of a new task, she is to add it to her list but continue working on one task at a time.       Assessment / Recommendations / Plan   Plan Continue with current plan of care      Progression Toward Goals   Progression toward goals Not progressing toward goals (comment)   bipolar disorder, depression affecting carryover  SLP Education - 07/17/19 1050    Education Details break down larger tasks; needs to use recommended strategies outside of work if she wants her function to improve    Person(s) Educated Patient    Methods Explanation;Handout    Comprehension Verbalized understanding;Need further instruction              SLP Long Term Goals - 07/17/19 [place] #1   Title Pt will utilize 2 compensatory strategies to recall store lists and daily to do's with rare min A over 3 sessions    Time 3    Period Weeks    Status On-going      SLP LONG TERM GOAL #2   Title pt will use anomia compensations successfully in 15 minutes mod complex conversation over 3 therapy sessions    Time 3    Period Weeks    Status On-going      SLP LONG TERM GOAL #3   Title Pt  will utilize compensations for attention, processing and auditory comprehension when being given instructions, medical or insurance information at home and work with rare min A over 3 sessions    Time 3    Period Weeks    Status On-going      SLP LONG TERM GOAL #4   Title Pt will verbalize anticipatory awareness before making safety decisions at work and household chores with rare min A over 3 sessions    Time 3    Period Weeks    Status On-going            Plan - 07/17/19 1050    Clinical Impression Statement [name] is a 49 y.o female pt with c/o ongoing difficulty with memory and attention since concussion and recent fall where she hit her head. I  recommend short course of ST (9-13 visits) to maximize carryover of compensatory strategie for cognitive linguistic impairments for safety and QOL as well as success at work.    Speech Therapy Frequency 2x / week    Duration --   6 weeks or 13 visits   Treatment/Interventions SLP instruction and feedback;Cueing hierarchy;Environmental controls;Language facilitation;Compensatory techniques;Cognitive reorganization;Functional tasks;Compensatory strategies;Patient/family education;Multimodal communcation approach;Internal/external aids    Potential to Achieve Goals Good    Potential Considerations Co-morbidities           Patient will benefit from skilled therapeutic intervention in order to improve the following deficits and impairments:   Cognitive communication deficit    Problem List Patient Active Problem List   Diagnosis Date Noted  . 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 Genetic testing 02/27/2018  . Family history of genetic disease carrier   . Family history of breast cancer   . Family history of colon cancer   . Head injury with loss of consciousness ([place]) 11/20/2017  . Nausea 11/20/2017  . 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 Intractable persistent migraine aura with cerebral infarction and status  migrainosus ([place]) 11/20/2017  . Tremor observed on examination 11/20/2017  . Left shoulder pain 02/18/2017  . MDD (major depressive disorder), recurrent severe, without psychosis ([place]) 12/28/2016  . Muscle strain 09/19/2016  . Right foot injury, subsequent encounter 08/25/2016  . Low back pain 06/27/2016  . Left wrist injury, subsequent encounter 06/27/2016  . Pain of left thumb 10/07/2015  . 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 Strain of left thumb 02/11/2015  . Strain of right forearm 02/11/2015  . Right  shoulder pain 12/31/2014  . Injury of right little finger 12/31/2014  . Episodic cluster headache, not intractable 12/02/2014  . 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 Hypersomnia, recurrent 12/02/2014  . Migraine aura, persistent, intractable, with status migrainosus 12/02/2014  . Lower back injury 09/30/2014  . Bipolar I disorder, most recent episode depressed ([place])   . MDD (major depressive disorder), recurrent, severe, with psychosis ([place]) 09/23/2014  . Injury of left index finger 08/20/2014  . Right ankle sprain 06/01/2014  . Contusion, multiple sites 06/01/2014  . Strain of right gastrocnemius muscle 06/01/2014  . Phonophobia 05/04/2014  . Photophobia of both eyes 05/04/2014  . Emotionally unstable borderline personality disorder ([place]) 05/04/2014  . Nausea with vomiting 05/04/2014  . Mixed bipolar I disorder ([place])   . Bipolar I disorder, most recent episode mixed ([place]) 04/18/2014  . Bipolar affective disorder, depressed, mild ([place]) 04/12/2014  . Suicidal ideation 04/12/2014  . Injury of right shoulder and upper arm 02/17/2014  . Left leg pain 06/03/2013  . Right hip pain 06/03/2013  . Migraine with status migrainosus 01/08/2013  . Personality disorder ([place])   . Chronic migraine 05/08/2012  . Contact dermatitis 11/27/2011  . Major depressive disorder, recurrent episode ([place]) 10/27/2011  .  Generalized anxiety disorder 10/27/2011  . 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 Borderline personality disorder ([place]) 10/27/2011  . Right foot pain 09/28/2011  . 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 Ganglion cyst 09/29/2009  . PES PLANUS 07/01/2008  . BIPOLAR DISORDER UNSPECIFIED 06/09/2008   Deneise Lever, [place], [place] 07/17/2019, 10:54 AM  [facility] [address] [place], [place], [address] Phone: [phone]   Fax:  [phone]   Name: [name] MRN: [id number] Date of Birth: Apr 15, 1970

## 2019-07-21 ENCOUNTER — Encounter: Payer: Self-pay | Admitting: Psychology

## 2019-07-21 ENCOUNTER — Ambulatory Visit: Payer: PPO | Admitting: Adult Health

## 2019-07-21 ENCOUNTER — Other Ambulatory Visit: Payer: Self-pay

## 2019-07-21 VITALS — BP 109/83 | HR 66 | Ht 70.0 in | Wt 253.0 lb

## 2019-07-21 DIAGNOSIS — R4789 Other speech disturbances: Secondary | ICD-10-CM | POA: Diagnosis not present

## 2019-07-21 DIAGNOSIS — F0781 Postconcussional syndrome: Secondary | ICD-10-CM | POA: Diagnosis not present

## 2019-07-21 DIAGNOSIS — R413 Other amnesia: Secondary | ICD-10-CM | POA: Diagnosis not present

## 2019-07-21 NOTE — Patient Instructions (Signed)
Your Plan:  Referral to neuropsychology for memory eval If your symptoms worsen or you develop new symptoms please let us know.    Thank you for coming to see [place] at [facility]. I hope we have been able to provide you high quality care today.  You may receive a patient satisfaction survey over the next few weeks. We would appreciate your feedback and comments so that we may continue to improve ourselves and the health of our patients.

## 2019-07-21 NOTE — Progress Notes (Addendum)
PATIENT: [name] DOB: [birth year]  REASON FOR VISIT: follow up HISTORY FROM: patient  HISTORY OF PRESENT ILLNESS: Today 07/21/19:  [name] is a 49 year old female with a history of postconcussive syndrome and migraine headaches.  She returns today for follow-up.  She states that she has been seeing speech therapy and feels that it has helped some.  She reports her therapist has mentioned additional testing with neuropsychology.  Patient states that she works at [employer].  States that she had a customer recently who was on the phone while checking out.  She states that she became very overwhelmed over this incident.  She states that she also has occasions where she forgets what she goes into a room for.  Often she does remember it later.  She states that she often loses her train of thought in mid sentence.  Or may not be able to find the right word to say.  She also states when she is working if a customer gives her her email address she has a hard time processing it.  She was on Adderall when she was in school.  She states that her psychiatrist stopped this when school ended.  She is not been on this medication for quite some time.  She returns today for an evaluation.  HISTORY 02/17/19:  [name] is a 49 year old female with a history of postconcussive syndrome and migraine headaches.  She returns today for follow-up.  The patient reports that Toradol injection resolved her headache.  She has not had a headache since then.  She continues on Depakote and Emgality.  Her next injection is February 10 for [employer].  She continues to have chronic dizziness.  She also reports some memory issues but is unsure if it is just "brain fog" she states on occasion she will also drop things but this has been ongoing.  The patient had a CT of the head back in 2019 after hitting her head that was unremarkable.  She returns today for an evaluation.  REVIEW OF SYSTEMS: Out of a complete 14 system  review of symptoms, the patient complains only of the following symptoms, and all other reviewed systems are negative.  See HPI  ALLERGIES: Allergies  Allergen Reactions  . Adhesive [Tape] Itching and Rash    Also reacted to Steri Strips and Band-Aids.  . Dilaudid [Hydromorphone Hcl] Itching  . Morphine Nausea And Vomiting  . Penicillins Hives    Has patient had a PCN reaction causing immediate rash, facial/tongue/throat swelling, SOB or lightheadedness with hypotension: YES Has patient had a PCN reaction causing severe rash involving mucus membranes or skin necrosis: NO Has patient had a PCN reaction that required hospitalization NO Has patient had a PCN reaction occurring within the last 10 years:NO If all of the above answers are "NO", then may proceed with Cephalosporin use.  Marland Kitchen Percocet [Oxycodone-Acetaminophen] Itching  . Prednisone Hives  . Provera [Medroxyprogesterone Acetate] Other (See Comments)    Causes manic episodes  . Ultram [Tramadol Hcl] Itching    HOME MEDICATIONS: Outpatient Medications Prior to Visit  Medication Sig Dispense Refill  . CALCIUM PO Take 1 tablet by mouth in the morning and at bedtime.    . Cholecalciferol (VITAMIN D3 PO) Take 1 capsule by mouth daily.    . clobetasol cream (TEMOVATE) 0.05 %   3  . Cyanocobalamin (B-12 PO) Take 1 tablet by mouth daily.    . diclofenac (VOLTAREN) 75 MG EC tablet TAKE 1 TABLET(75 MG)  BY MOUTH TWICE DAILY 60 tablet 1  . divalproex (DEPAKOTE ER) 500 MG 24 hr tablet TAKE 2 TABLETS BY MOUTH AT BEDTIME FOR MOOD (Patient taking differently: Take 1,000 mg by mouth daily. TAKE 2 TABLETS BY MOUTH AT BEDTIME FOR MOOD) 60 tablet 5  . Galcanezumab-gnlm (EMGALITY) 120 MG/ML SOAJ Inject 120 mg into the skin every 30 (thirty) days. 1 mL 11  . hydrOXYzine (VISTARIL) 50 MG capsule Take 50 mg by mouth in the morning and at bedtime.   2  . lamoTRIgine (LAMICTAL) 150 MG tablet Take 300 mg by mouth at bedtime.  1  . LATUDA 20 MG TABS  tablet Take 20 mg by mouth every morning.    Marland Kitchen levocetirizine (XYZAL) 5 MG tablet Take 5 mg by mouth every evening.  1  . lurasidone (LATUDA) 80 MG TABS tablet Take 1 tablet (80 mg total) by mouth daily with supper. For mood control 30 tablet 0  . metoprolol succinate (TOPROL-XL) 100 MG 24 hr tablet Take 1 tablet (100 mg total) by mouth daily. Take with or immediately following a meal. 30 tablet 0  . Multiple Vitamin (MULTIVITAMIN WITH MINERALS) TABS tablet Take 1 tablet by mouth daily.    . Multiple Vitamins-Minerals (ZINC PO) Take 1 tablet by mouth daily.    . promethazine (PHENERGAN) 25 MG tablet TK 1 T PO BID FOR 15 DAYS PRN (Patient taking differently: Take 25 mg by mouth 2 (two) times daily as needed for nausea or vomiting. TK 1 T PO BID FOR 15 DAYS PRN) 30 tablet 0  . SUMAtriptan Succinate Refill 6 MG/0.5ML SOCT Inject 0.5 ml SQ at the onset of migraine. May repeat in 2 hours if needed. (Patient taking differently: Inject 0.5 mLs into the skin as needed (migraine). Inject 0.5 ml SQ at the onset of migraine. May repeat in 2 hours if needed.) 0.5 mL 5  . traZODone (DESYREL) 100 MG tablet Take 100 mg by mouth at bedtime.    . traZODone (DESYREL) 50 MG tablet Take 1 tablet (50 mg total) by mouth at bedtime as needed for sleep. 30 tablet 0   Facility-Administered Medications Prior to Visit  Medication Dose Route Frequency Provider Last Rate Last Admin  . methylPREDNISolone acetate (DEPO-MEDROL) injection 40 mg  40 mg Intra-articular Once Hudnall, Sharyn Lull, MD      . valproate (DEPACON) 1,000 mg in dextrose 5 % 50 mL IVPB  1,000 mg Intravenous Once Dohmeier, Asencion Partridge, MD        PAST MEDICAL HISTORY: Past Medical History:  Diagnosis Date  . Achilles tendinitis   . Achilles tendinitis   . ADHD (attention deficit hyperactivity disorder)   . Allergy   . Arthritis   . Bipolar affective ([place])   . Bipolar disorder ([place])   . Cataracts, bilateral   . Eczema   . Family history of breast cancer   .  Family history of colon cancer   . Family history of genetic disease carrier   . Ganglion cyst 09/29/2009   left wrist (2 cyst)  . Hyperprolactinemia ([place])   . Hypertension   . Lipoma   . Migraine   . Personality disorder ([place])   . Pes planus     PAST SURGICAL HISTORY: Past Surgical History:  Procedure Laterality Date  . ANKLE SURGERY  12/88   left   . chest nodule  1990?   rt chest wall nodule removal  . GANGLION CYST EXCISION  2011  . lipoma removal    .  right bunioectomy    . SHOULDER SURGERY  01/13/2011   right, partial tear  . tumor resection left thigh      FAMILY HISTORY: Family History  Problem Relation Age of Onset  . Hypertension Mother   . Hyperlipidemia Mother   . 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 Heart attack Father   . Heart disease Father   . Hypertension Father   . Bipolar disorder Father   . Diabetes Paternal Grandfather   . Heart disease Maternal Aunt   . Breast cancer Maternal Aunt 65  . Heart disease Maternal Grandmother   . Colon cancer Maternal Grandmother 66  . Colon cancer Maternal Uncle 75  . Lymphoma Maternal Grandfather 67  . Breast cancer Maternal Aunt 65  . Breast cancer Maternal Aunt 35  . Breast cancer Maternal Aunt   . Bone cancer Cousin 57  . Cervical cancer Cousin        Genetic testing- 'was positive for a gene' had a mastectomy    SOCIAL HISTORY: Social History   Socioeconomic History  . Marital status: Single    Spouse name: Not on file  . Number of children: 0  . Years of education: Not on file  . Highest education level: Not on file  Occupational History    Employer: [employer]  Tobacco Use  . Smoking status: Never Smoker  . Smokeless tobacco: Never Used  Substance and Sexual Activity  . Alcohol use: No    Alcohol/week: 0.0 standard drinks  . Drug use: No  . Sexual activity: Never    Birth control/protection: Pill  Other Topics Concern  . Not on file  Social  History Narrative   Caffeine  2 sodas daily, 1 cup coffee daily.   Social Determinants of Health   Financial Resource Strain:   . Difficulty of Paying Living Expenses:   Food Insecurity:   . Worried About Charity fundraiser in the Last Year:   . Arboriculturist in the Last Year:   Transportation Needs:   . Film/video editor (Medical):   Marland Kitchen Lack of Transportation (Non-Medical):   Physical Activity:   . Days of Exercise per Week:   . Minutes of Exercise per Session:   Stress:   . Feeling of Stress :   Social Connections:   . Frequency of Communication with Friends and Family:   . Frequency of Social Gatherings with Friends and Family:   . Attends Religious Services:   . Active Member of Clubs or Organizations:   . Attends Archivist Meetings:   Marland Kitchen Marital Status:   Intimate Partner Violence:   . Fear of Current or Ex-Partner:   . Emotionally Abused:   Marland Kitchen Physically Abused:   . Sexually Abused:       PHYSICAL EXAM  Vitals:   07/21/19 0842  BP: 109/83  Pulse: 66  Weight: 253 lb (114.8 kg)  Height: 5' 10"  (1.778 m)   Body mass index is 36.3 kg/m.  Generalized: Well developed, in no acute distress   Neurological examination  Mentation: Alert oriented to time, place, history taking. Follows all commands speech and language fluent Cranial nerve II-XII: Pupils were equal round reactive to light. Extraocular movements were full, visual field were full on confrontational test.  Head turning and shoulder shrug  were normal and symmetric. Motor: The motor testing reveals 5 over 5 strength of all 4 extremities. Good  symmetric motor tone is noted throughout.  Sensory: Sensory testing is intact to soft touch on all 4 extremities. No evidence of extinction is noted.  Coordination: Cerebellar testing reveals good finger-nose-finger and heel-to-shin bilaterally.  Gait and station: Gait is normal.  Reflexes: Deep tendon reflexes are symmetric and normal bilaterally.    DIAGNOSTIC DATA (LABS, IMAGING, TESTING) - I reviewed patient records, labs, notes, testing and imaging myself where available.  Lab Results  Component Value Date   WBC 4.8 05/08/2019   HGB 13.3 05/08/2019   HCT 39.0 05/08/2019   MCV 94.6 05/08/2019   PLT 167 05/08/2019      Component Value Date/Time   NA 139 05/08/2019 1200   NA 140 02/17/2019 1344   K 4.1 05/08/2019 1200   CL 105 05/08/2019 1200   CO2 25 02/17/2019 1344   GLUCOSE 92 05/08/2019 1200   BUN 10 05/08/2019 1200   BUN 9 02/17/2019 1344   CREATININE 1.00 05/08/2019 1200   CREATININE 0.83 09/11/2011 1524   CALCIUM 9.5 02/17/2019 1344   PROT 5.5 (L) 02/17/2019 1344   ALBUMIN 4.1 02/17/2019 1344   AST 16 02/17/2019 1344   ALT 9 02/17/2019 1344   ALKPHOS 67 02/17/2019 1344   BILITOT 0.3 02/17/2019 1344   GFRNONAA 65 02/17/2019 1344   GFRAA 75 02/17/2019 1344   Lab Results  Component Value Date   CHOL 198 12/29/2016   HDL 53 12/29/2016   LDLCALC 118 (H) 12/29/2016   TRIG 136 12/29/2016   CHOLHDL 3.7 12/29/2016   Lab Results  Component Value Date   HGBA1C 5.1 12/29/2016   No results found for: [id number] Lab Results  Component Value Date   TSH 4.288 12/29/2016      ASSESSMENT AND PLAN 49 y.o. year old female  has a past medical history of Achilles tendinitis, Achilles tendinitis, ADHD (attention deficit hyperactivity disorder), Allergy, Arthritis, Bipolar affective ([place]), Bipolar disorder ([place]), Cataracts, bilateral, Eczema, Family history of breast cancer, Family history of colon cancer, Family history of genetic disease carrier, Ganglion cyst (09/29/2009), Hyperprolactinemia ([place]), Hypertension, Lipoma, Migraine, Personality disorder ([place]), and Pes planus. here with:  Memory disturbance Word finding difficulty History of postconcussive syndrome  Referral to neuropsychology for neuropsychological testing Follow-up in 6 months or sooner if needed   I spent 30 minutes of face-to-face and  non-face-to-face time with patient.  This included previsit chart review, lab review, study review, order entry, electronic health record documentation, patient education.  Ward Givens, MSN, NP-C 07/21/2019, 8:49 AM [facility] [address], [place], [place] [address] [phone]

## 2019-07-23 ENCOUNTER — Ambulatory Visit: Payer: PPO | Admitting: Speech Pathology

## 2019-07-23 ENCOUNTER — Other Ambulatory Visit: Payer: Self-pay

## 2019-07-23 ENCOUNTER — Encounter: Payer: Self-pay | Admitting: Speech Pathology

## 2019-07-23 DIAGNOSIS — R41841 Cognitive communication deficit: Secondary | ICD-10-CM | POA: Diagnosis not present

## 2019-07-23 NOTE — Therapy (Signed)
[facility] [address] [place], [place], [address] Phone: [phone]   Fax:  [phone]  Speech Language Pathology Treatment  Patient Details  Name: [name] MRN: [id number] Date of Birth: 02/26/1970 Referring Provider (SLP): Trixie Deis NP   Encounter Date: 07/23/2019   End of Session - 07/23/19 1257    Visit Number 9    Number of Visits 13    Date for SLP Re-Evaluation 07/25/19    SLP Start Time 0846    SLP Stop Time  0929    SLP Time Calculation (min) 43 min    Activity Tolerance Patient tolerated treatment well           Past Medical History:  Diagnosis Date  . Achilles tendinitis   . Achilles tendinitis   . ADHD (attention deficit hyperactivity disorder)   . Allergy   . Arthritis   . Bipolar affective ([place])   . Bipolar disorder ([place])   . Cataracts, bilateral   . Eczema   . Family history of breast cancer   . Family history of colon cancer   . Family history of genetic disease carrier   . Ganglion cyst 09/29/2009   left wrist (2 cyst)  . Hyperprolactinemia ([place])   . Hypertension   . Lipoma   . Migraine   . Personality disorder ([place])   . Pes planus     Past Surgical History:  Procedure Laterality Date  . ANKLE SURGERY  12/88   left   . chest nodule  1990?   rt chest wall nodule removal  . GANGLION CYST EXCISION  2011  . lipoma removal    . right bunioectomy    . SHOULDER SURGERY  01/13/2011   right, partial tear  . tumor resection left thigh      There were no vitals filed for this visit.   Subjective Assessment - 07/23/19 0847    Subjective "I saw [name] yesterday"    Currently in Pain? Yes    Pain Score 2     Pain Location Head    Pain Descriptors / Indicators Headache    Pain Type Chronic pain    Pain Onset In the past 7 days    Pain Frequency Occasional                 ADULT SLP TREATMENT - 07/23/19 0901      General Information    Behavior/Cognition Alert;Cooperative;Distractible;Requires cueing      Treatment Provided   Treatment provided Cognitive-Linquistic      Cognitive-Linquistic Treatment   Treatment focused on Cognition;Patient/family/caregiver education    Skilled Treatment Pt reports success at work on Saturday. She ran into a cinder block mowing the yard, due to inattention. She continues to have clutter in her room and has not followed through with timer clearing out old clothes and bins. Demonstrated how to use mininotebook as daily to do list. She wrote out a to do list for today through Saturday and will add to it as needed this week as needed.       Assessment / Recommendations / Plan   Plan Continue with current plan of care      Progression Toward Goals   Progression toward goals Progressing toward goals            SLP Education - 07/23/19 1255    Education Details date the notebook each day, plan ahead and date ahead to plan out your actiitivitites including e  [employer] store, bill, cleaning room for 15 mintes    Person(s) Educated Patient    Methods Explanation;Demonstration;Verbal cues    Comprehension Verbalized understanding;Returned demonstration;Need further instruction              SLP Long Term Goals - 07/23/19 1256      SLP LONG TERM GOAL #1   Title Pt will utilize 2 compensatory strategies to recall store lists and daily to do's with rare min A over 3 sessions    Time 3    Period Weeks    Status On-going      SLP LONG TERM GOAL #2   Title pt will use anomia compensations successfully in 15 minutes mod complex conversation over 3 therapy sessions    Time 3    Period Weeks    Status On-going      SLP LONG TERM GOAL #3   Title Pt will utilize compensations for attention, processing and auditory comprehension when being given instructions, medical or insurance information at home and work with rare min A over 3 sessions    Time 3    Period Weeks    Status On-going      SLP  LONG TERM GOAL #4   Title Pt will verbalize anticipatory awareness before making safety decisions at work and household chores with rare min A over 3 sessions    Time 3    Period Weeks    Status On-going            Plan - 07/23/19 1256    Clinical Impression Statement [name] is a 49 y.o female pt with c/o ongoing difficulty with memory and attention since concussion and recent fall where she hit her head. I  recommend short course of ST (9-13 visits) to maximize carryover of compensatory strategie for cognitive linguistic impairments for safety and QOL as well as success at work.    Speech Therapy Frequency 2x / week    Duration --   6 weeks or 13 visits   Treatment/Interventions SLP instruction and feedback;Cueing hierarchy;Environmental controls;Language facilitation;Compensatory techniques;Cognitive reorganization;Functional tasks;Compensatory strategies;Patient/family education;Multimodal communcation approach;Internal/external aids    Potential to Achieve Goals Good    Potential Considerations Co-morbidities           Patient will benefit from skilled therapeutic intervention in order to improve the following deficits and impairments:   Cognitive communication deficit    Problem List Patient Active Problem List   Diagnosis Date Noted  . 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 Genetic testing 02/27/2018  . Family history of genetic disease carrier   . Family history of breast cancer   . Family history of colon cancer   . Head injury with loss of consciousness ([place]) 11/20/2017  . Nausea 11/20/2017  . 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 Tremor observed on examination 11/20/2017  . Left shoulder pain 02/18/2017  . MDD (major depressive disorder), recurrent severe, without psychosis ([place]) 12/28/2016  . Muscle strain 09/19/2016  . Right foot injury,  subsequent encounter 08/25/2016  . Low back pain 06/27/2016  . Left wrist injury, subsequent encounter 06/27/2016  . Pain of left thumb 10/07/2015  . 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 Strain of left thumb 02/11/2015  . Strain of right forearm 02/11/2015  . Right shoulder pain 12/31/2014  . Injury of right little finger 12/31/2014  . Episodic cluster headache, not intractable 12/02/2014  .  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 Hypersomnia, recurrent 12/02/2014  . Migraine aura, persistent, intractable, with status migrainosus 12/02/2014  . Lower back injury 09/30/2014  . Bipolar I disorder, most recent episode depressed ([employer])   . MDD (major depressive disorder), recurrent, severe, with psychosis ([place]) 09/23/2014  . Injury of left index finger 08/20/2014  . Right ankle sprain 06/01/2014  . Contusion, multiple sites 06/01/2014  . Strain of right gastrocnemius muscle 06/01/2014  . Phonophobia 05/04/2014  . Photophobia of both eyes 05/04/2014  . Emotionally unstable borderline personality disorder ([place]) 05/04/2014  . Nausea with vomiting 05/04/2014  . Mixed bipolar I disorder ([place])   . Bipolar I disorder, most recent episode mixed ([place]) 04/18/2014  . Bipolar affective disorder, depressed, mild ([place]) 04/12/2014  . Suicidal ideation 04/12/2014  . Injury of right shoulder and upper arm 02/17/2014  . Left leg pain 06/03/2013  . Right hip pain 06/03/2013  . Migraine with status migrainosus 01/08/2013  . Personality disorder ([place])   . Chronic migraine 05/08/2012  . Contact dermatitis 11/27/2011  . Major depressive disorder, recurrent episode ([place]) 10/27/2011  . Generalized anxiety disorder 10/27/2011  . 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 Borderline personality disorder ([employer]) 10/27/2011  . Right foot pain 09/28/2011  . 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 Ganglion cyst 09/29/2009  . PES PLANUS 07/01/2008  . BIPOLAR DISORDER UNSPECIFIED 06/09/2008    Cailey Trigueros, Annye Rusk MS, CCC-SLP 07/23/2019, 12:57 PM  [facility] [address] [place], [place], [address] Phone: [phone]   Fax:  [phone]   Name: HOLLACE MICHELLI MRN: [id number] Date of Birth: 10-01-1970

## 2019-07-28 ENCOUNTER — Encounter: Payer: Self-pay | Admitting: Speech Pathology

## 2019-07-28 ENCOUNTER — Other Ambulatory Visit: Payer: Self-pay

## 2019-07-28 ENCOUNTER — Ambulatory Visit: Payer: PPO | Admitting: Speech Pathology

## 2019-07-28 DIAGNOSIS — R41841 Cognitive communication deficit: Secondary | ICD-10-CM

## 2019-07-28 NOTE — Patient Instructions (Signed)
   Keep writing in your to do list - even if it is just something small, like take picture of shirt for [employer] or clean room for 15-20 minutes  Keep working on your room 15-20 minutes to reduce clutter to help your brain focus  Keep up making good safety decisions and scanning your environment before you walk in the room  Bring in your notebook - to do list  Use it also to take notes at work or have customers write down their email or address  Use the notebook to keep you focused so you don't go down a rabbit hole (hyperfocus)   When you do not complete something that is on the list, find another day when you can do it and add it on another day to help you remember

## 2019-07-29 NOTE — Therapy (Signed)
[facility] [address] [place], [place], [address] Phone: [phone]   Fax:  [phone]  Speech Language Pathology Treatment  Patient Details  Name: [name] MRN: [id number] Date of Birth: 06-Feb-1970 Referring Provider (SLP): Trixie Deis NP   Encounter Date: 07/28/2019   End of Session - 07/29/19 1309    Visit Number 10    Number of Visits 13    Date for SLP Re-Evaluation 07/25/19    SLP Start Time 0932    SLP Stop Time  1937    SLP Time Calculation (min) 42 min    Activity Tolerance Patient tolerated treatment well           Past Medical History:  Diagnosis Date  . Achilles tendinitis   . Achilles tendinitis   . ADHD (attention deficit hyperactivity disorder)   . Allergy   . Arthritis   . Bipolar affective ([place])   . Bipolar disorder ([place])   . Cataracts, bilateral   . Eczema   . Family history of breast cancer   . Family history of colon cancer   . Family history of genetic disease carrier   . Ganglion cyst 09/29/2009   left wrist (2 cyst)  . Hyperprolactinemia ([place])   . Hypertension   . Lipoma   . Migraine   . Personality disorder ([place])   . Pes planus     Past Surgical History:  Procedure Laterality Date  . ANKLE SURGERY  12/88   left   . chest nodule  1990?   rt chest wall nodule removal  . GANGLION CYST EXCISION  2011  . lipoma removal    . right bunioectomy    . SHOULDER SURGERY  01/13/2011   right, partial tear  . tumor resection left thigh      There were no vitals filed for this visit.   Subjective Assessment - 07/28/19 0938    Subjective "I think I had a full blown panic attack"    Currently in Pain? Yes    Pain Score 5     Pain Location Chest    Pain Orientation Right;Left    Pain Descriptors / Indicators Other (Comment)   constricting   Pain Type Acute pain             Speech Therapy Progress Note  Dates of Reporting Period: 05/28/19 to  07/28/19     Goal Update: Continue goals  Plan: Continue POC  Reason Skilled Services are Required: [name] is working on more consistently carrying over compensatory strategies at her work place for improved success at work, reduced anxiety due to work mistakes      ADULT SLP TREATMENT - 07/28/19 [place]   Behavior/Cognition Alert;Cooperative;Distractible;Requires cueing      Cognitive-Linquistic Treatment   Treatment focused on Cognition;Patient/family/caregiver education    Skilled Treatment Pt reports panic attact at work. She reports that she has followed up with using a to-do and planning several days out to recall tasks and bills. [name] did work for [employer] on her room clutter, however, she only did this once due to working over the weekend. She continues to ask customers their email by requesting 1 letter at a time to aide recall and attention. [name] did not bring note book into ST today. She reports carrying over use of notebook to manage daily and weekly to do lists. today, we targeted strategy for if she does not get a task  done on her daily list. Instructed [name] to carryover the task to the next day that she has time to complete the task. We discussed rationale for this strategy is to remeber the task. For example., if she is to pay a bill and this is on her list, but doesn't get completed, add it to another day so you don't forget about it. [name] also reports ongoing strategy of asking for customers' emails 1 letter at a time so she doesn't get overwhelmed and to A with recall. Educated [name] on how her cognition is impacted by depression, anxiety and bipolar as well as concussion      Assessment / Recommendations / [place] with current plan of care      Progression Toward Goals   Progression toward goals Progressing toward goals            SLP Education - 07/29/19 1307    Education Details use of notebook for daily and weekly to do's               SLP Long Term Goals - 07/29/19 1308      SLP LONG TERM GOAL #1   Title Pt will utilize 2 compensatory strategies to recall store lists and daily to do's with rare min A over 3 sessions    Time 2    Period Weeks    Status On-going      SLP LONG TERM GOAL #2   Title pt will use anomia compensations successfully in 15 minutes mod complex conversation over 3 therapy sessions    Time 2    Period Weeks    Status On-going      SLP LONG TERM GOAL #3   Title Pt will utilize compensations for attention, processing and auditory comprehension when being given instructions, medical or insurance information at home and work with rare min A over 3 sessions    Time 2    Period Weeks    Status On-going      SLP LONG TERM GOAL #4   Title Pt will verbalize anticipatory awareness before making safety decisions at work and household chores with rare min A over 3 sessions    Time 2    Period Weeks    Status On-going            Plan - 07/29/19 1308    Clinical Impression Statement [name] is a 49 y.o female pt with c/o ongoing difficulty with memory and attention since concussion and recent fall where she hit her head. I  recommend short course of ST (9-13 visits) to maximize carryover of compensatory strategie for cognitive linguistic impairments for safety and QOL as well as success at work.    Speech Therapy Frequency 2x / week    Duration --   6 weeks or 13 visits   Treatment/Interventions SLP instruction and feedback;Cueing hierarchy;Environmental controls;Language facilitation;Compensatory techniques;Cognitive reorganization;Functional tasks;Compensatory strategies;Patient/family education;Multimodal communcation approach;Internal/external aids    Potential to Achieve Goals Good    Potential Considerations Co-morbidities           Patient will benefit from skilled therapeutic intervention in order to improve the following deficits and impairments:   Cognitive communication  deficit    Problem List Patient Active Problem List   Diagnosis Date Noted  . 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 Genetic testing 02/27/2018  . Family history of genetic disease carrier   . Family history of breast cancer   .  Family history of colon cancer   . Head injury with loss of consciousness ([place]) 11/20/2017  . Nausea 11/20/2017  . 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 Tremor observed on examination 11/20/2017  . Left shoulder pain 02/18/2017  . MDD (major depressive disorder), recurrent severe, without psychosis ([place]) 12/28/2016  . Muscle strain 09/19/2016  . Right foot injury, subsequent encounter 08/25/2016  . Low back pain 06/27/2016  . Left wrist injury, subsequent encounter 06/27/2016  . Pain of left thumb 10/07/2015  . 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 Strain of left thumb 02/11/2015  . Strain of right forearm 02/11/2015  . Right shoulder pain 12/31/2014  . Injury of right little finger 12/31/2014  . Episodic cluster headache, not intractable 12/02/2014  . 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 Hypersomnia, recurrent 12/02/2014  . Migraine aura, persistent, intractable, with status migrainosus 12/02/2014  . Lower back injury 09/30/2014  . Bipolar I disorder, most recent episode depressed ([place])   . MDD (major depressive disorder), recurrent, severe, with psychosis ([place]) 09/23/2014  . Injury of left index finger 08/20/2014  . Right ankle sprain 06/01/2014  . Contusion, multiple sites 06/01/2014  . Strain of right gastrocnemius muscle 06/01/2014  . Phonophobia 05/04/2014  . Photophobia of both eyes 05/04/2014  . Emotionally unstable borderline personality disorder ([place]) 05/04/2014  . Nausea with vomiting 05/04/2014  . Mixed bipolar I disorder ([place])   . Bipolar I  disorder, most recent episode mixed ([place]) 04/18/2014  . Bipolar affective disorder, depressed, mild ([place]) 04/12/2014  . Suicidal ideation 04/12/2014  . Injury of right shoulder and upper arm 02/17/2014  . Left leg pain 06/03/2013  . Right hip pain 06/03/2013  . Migraine with status migrainosus 01/08/2013  . Personality disorder ([place])   . Chronic migraine 05/08/2012  . Contact dermatitis 11/27/2011  . Major depressive disorder, recurrent episode ([place]) 10/27/2011  . Generalized anxiety disorder 10/27/2011  . 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 Borderline personality disorder ([place]) 10/27/2011  . Right foot pain 09/28/2011  . 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 Ganglion cyst 09/29/2009  . PES PLANUS 07/01/2008  . BIPOLAR DISORDER UNSPECIFIED 06/09/2008    Romond Pipkins, Annye Rusk MS, CCC-SLP 07/29/2019, 1:11 PM  [facility] [address] [place], [place], [address] Phone: [phone]   Fax:  [phone]   Name: [name] MRN: [id number] Date of Birth: 07-29-1970

## 2019-07-30 ENCOUNTER — Other Ambulatory Visit: Payer: Self-pay

## 2019-07-30 ENCOUNTER — Ambulatory Visit: Payer: PPO | Admitting: Speech Pathology

## 2019-07-30 ENCOUNTER — Encounter: Payer: Self-pay | Admitting: Speech Pathology

## 2019-07-30 DIAGNOSIS — R41841 Cognitive communication deficit: Secondary | ICD-10-CM

## 2019-07-30 NOTE — Therapy (Signed)
[facility] [address] , [place], [address] Phone: [phone]   Fax:  [phone]  Speech Language Pathology Treatment  Patient Details  Name: [name] MRN: [id number] Date of Birth: 06/06/1970 Referring Provider (SLP): [name] Millikin NP   Encounter Date: 07/30/2019   End of Session - 07/30/19 1308    Visit Number 11    Number of Visits 13    Date for SLP Re-Evaluation 07/25/19    SLP Start Time 0847    SLP Stop Time  0930    SLP Time Calculation (min) 43 min    Activity Tolerance Patient tolerated treatment well           Past Medical History:  Diagnosis Date  . Achilles tendinitis   . Achilles tendinitis   . ADHD (attention deficit hyperactivity disorder)   . Allergy   . Arthritis   . Bipolar affective (HCC)   . Bipolar disorder (HCC)   . Cataracts, bilateral   . Eczema   . Family history of breast cancer   . Family history of colon cancer   . Family history of genetic disease carrier   . Ganglion cyst 09/29/2009   left wrist (2 cyst)  . Hyperprolactinemia (HCC)   . Hypertension   . Lipoma   . Migraine   . Personality disorder (HCC)   . Pes planus     Past Surgical History:  Procedure Laterality Date  . ANKLE SURGERY  12/88   left   . chest nodule  1990?   rt chest wall nodule removal  . GANGLION CYST EXCISION  2011  . lipoma removal    . right bunioectomy    . SHOULDER SURGERY  01/13/2011   right, partial tear  . tumor resection left thigh      There were no vitals filed for this visit.   Subjective Assessment - 07/30/19 0850    Subjective "I'm exhausted. We did yard work yesterday"    Currently in Pain? Yes    Pain Score 3     Pain Location Head    Pain Descriptors / Indicators Headache    Pain Type Chronic pain                 ADULT SLP TREATMENT - 07/30/19 0856      General Information   Behavior/Cognition Alert;Cooperative;Distractible;Requires  cueing      Treatment Provided   Treatment provided Cognitive-Linquistic      Cognitive-Linquistic Treatment   Treatment focused on Cognition;Patient/family/caregiver education    Skilled Treatment Pt brought in her notebook. She [place] get [place] [employer] [place], so she transferred it to Monday and completed lisence renewal.  She is consistent with checking off tasks that are completed. She noted independently that listening to the radio while driving. I encouraged her to keep radio off in the car. She required occasional min A to ID 2 other goals/priorities for this week and write them in her to-do list.  Educated Joyce on memory vs attention, as she c/o of memory difficulties. Trained her in strategies of stopping what she is doing and focus on her speaker (mom, boss, co-worker) and repeat back what they said to make sure you processed what they said.      Assessment / Recommendations / Plan   Plan Continue with current plan of care      Progression Toward Goals   Progression toward goals Progressing toward goals              SLP Education - 07/30/19 0925    Education Details compensations for attention to A memory when her mom or boss tells her something    Person(s) Educated Patient    Methods Explanation;Demonstration;Verbal cues    Comprehension Verbalized understanding;Need further instruction;Returned demonstration              SLP Long Term Goals - 07/30/19 1308      SLP LONG TERM GOAL #1   Title Pt will utilize 2 compensatory strategies to recall store lists and daily to do's with rare min A over 3 sessions    Time 2    Period Weeks    Status On-going      SLP LONG TERM GOAL #2   Title pt will use anomia compensations successfully in 15 minutes mod complex conversation over 3 therapy sessions    Time 2    Period Weeks    Status On-going      SLP LONG TERM GOAL #3   Title Pt will utilize compensations for attention, processing and auditory comprehension when being  given instructions, medical or insurance information at home and work with rare min A over 3 sessions    Time 2    Period Weeks    Status On-going      SLP LONG TERM GOAL #4   Title Pt will verbalize anticipatory awareness before making safety decisions at work and household chores with rare min A over 3 sessions    Time 2    Period Weeks    Status On-going            Plan - 07/30/19 0930    Clinical Impression Statement [name] is a 48 y.o female pt with c/o ongoing difficulty with memory and attention since concussion and recent fall where she hit her head. I  recommend short course of ST (9-13 visits) to maximize carryover of compensatory strategie for cognitive linguistic impairments for safety and QOL as well as success at work.           Patient will benefit from skilled therapeutic intervention in order to improve the following deficits and impairments:   Cognitive communication deficit    Problem List Patient Active Problem List   Diagnosis Date Noted  . 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 Genetic testing 02/27/2018  . Family history of genetic disease carrier   . Family history of breast cancer   . Family history of colon cancer   . Head injury with loss of consciousness (HCC) 11/20/2017  . Nausea 11/20/2017  . 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 Tremor observed on examination 11/20/2017  . Left shoulder pain 02/18/2017  . MDD (major depressive disorder), recurrent severe, without psychosis (HCC) 12/28/2016  . Muscle strain 09/19/2016  . Right foot injury, subsequent encounter 08/25/2016  . Low back pain 06/27/2016  . Left wrist injury, subsequent encounter 06/27/2016  . Pain of left thumb 10/07/2015  . 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 Strain of left thumb 02/11/2015  . Strain of right forearm 02/11/2015  . Right shoulder  pain 12/31/2014  . Injury of right little finger 12/31/2014  . Episodic cluster headache, not intractable 12/02/2014  . 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 Hypersomnia, recurrent 12/02/2014  . Migraine aura, persistent, intractable, with status migrainosus 12/02/2014  . Lower back injury 09/30/2014  .   Bipolar I disorder, most recent episode depressed (HCC)   . MDD (major depressive disorder), recurrent, severe, with psychosis (HCC) 09/23/2014  . Injury of left index finger 08/20/2014  . Right ankle sprain 06/01/2014  . Contusion, multiple sites 06/01/2014  . Strain of right gastrocnemius muscle 06/01/2014  . Phonophobia 05/04/2014  . Photophobia of both eyes 05/04/2014  . Emotionally unstable borderline personality disorder (HCC) 05/04/2014  . Nausea with vomiting 05/04/2014  . Mixed bipolar I disorder (HCC)   . Bipolar I disorder, most recent episode mixed (HCC) 04/18/2014  . Bipolar affective disorder, depressed, mild (HCC) 04/12/2014  . Suicidal ideation 04/12/2014  . Injury of right shoulder and upper arm 02/17/2014  . Left leg pain 06/03/2013  . Right hip pain 06/03/2013  . Migraine with status migrainosus 01/08/2013  . Personality disorder (HCC)   . Chronic migraine 05/08/2012  . Contact dermatitis 11/27/2011  . Major depressive disorder, recurrent episode (HCC) 10/27/2011  . Generalized anxiety disorder 10/27/2011  . 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 Borderline personality disorder (HCC) 10/27/2011  . Right foot pain 09/28/2011  . 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  . Ganglion cyst 09/29/2009  . PES PLANUS 07/01/2008  . BIPOLAR DISORDER UNSPECIFIED 06/09/2008    ,  Ann MS, CCC-SLP 07/30/2019, 1:10 PM  [facility] [address] [place], , [address] Phone: [phone]   Fax:  [phone]   Name: [name] MRN: [id number] Date of Birth: 10/01/1970 

## 2019-07-30 NOTE — Patient Instructions (Signed)
  Keep up the good work thinking of your goals for the week and your tasks you need to get done and planning your week in the notebook and keeping track of them  If you don't get something done on the scheduled day, transfer it to another day  When you talk about memory, attention is the gateway to memory, so if you don't attend to it in the first place, you can't remember it  If something important, the person telling you the information they need to get your attention and you need to repeat the information back to them  If you are doing something (a chore, TV, social media, organizing clothes, checking someone out etc) you will not attend to or process what the person is saying to you

## 2019-08-05 DIAGNOSIS — R5383 Other fatigue: Secondary | ICD-10-CM | POA: Diagnosis not present

## 2019-08-05 DIAGNOSIS — I10 Essential (primary) hypertension: Secondary | ICD-10-CM | POA: Diagnosis not present

## 2019-08-05 DIAGNOSIS — G4730 Sleep apnea, unspecified: Secondary | ICD-10-CM | POA: Diagnosis not present

## 2019-08-05 DIAGNOSIS — Z0000 Encounter for general adult medical examination without abnormal findings: Secondary | ICD-10-CM | POA: Diagnosis not present

## 2019-08-05 DIAGNOSIS — Z13220 Encounter for screening for lipoid disorders: Secondary | ICD-10-CM | POA: Diagnosis not present

## 2019-08-05 DIAGNOSIS — G43001 Migraine without aura, not intractable, with status migrainosus: Secondary | ICD-10-CM | POA: Diagnosis not present

## 2019-08-05 DIAGNOSIS — F319 Bipolar disorder, unspecified: Secondary | ICD-10-CM | POA: Diagnosis not present

## 2019-08-06 ENCOUNTER — Ambulatory Visit: Payer: PPO | Attending: Family Medicine | Admitting: Speech Pathology

## 2019-08-06 ENCOUNTER — Other Ambulatory Visit: Payer: Self-pay

## 2019-08-06 DIAGNOSIS — I10 Essential (primary) hypertension: Secondary | ICD-10-CM | POA: Diagnosis not present

## 2019-08-06 DIAGNOSIS — R41841 Cognitive communication deficit: Secondary | ICD-10-CM | POA: Diagnosis not present

## 2019-08-06 DIAGNOSIS — R5383 Other fatigue: Secondary | ICD-10-CM | POA: Diagnosis not present

## 2019-08-06 DIAGNOSIS — Z13220 Encounter for screening for lipoid disorders: Secondary | ICD-10-CM | POA: Diagnosis not present

## 2019-08-06 NOTE — Therapy (Signed)
[name] [address] [place], [place], [address] Phone: [phone]   Fax:  [phone]  Speech Language Pathology Treatment  Patient Details  Name: [name] MRN: [id number] Date of Birth: 1970/09/25 Referring Provider (SLP): Trixie Deis NP   Encounter Date: 08/06/2019   End of Session - 08/06/19 1214    Visit Number 12    Number of Visits 13    SLP Start Time 6759    SLP Stop Time  0929    SLP Time Calculation (min) 42 min    Activity Tolerance Patient tolerated treatment well           Past Medical History:  Diagnosis Date  . Achilles tendinitis   . Achilles tendinitis   . ADHD (attention deficit hyperactivity disorder)   . Allergy   . Arthritis   . Bipolar affective ([place])   . Bipolar disorder ([place])   . Cataracts, bilateral   . Eczema   . Family history of breast cancer   . Family history of colon cancer   . Family history of genetic disease carrier   . Ganglion cyst 09/29/2009   left wrist (2 cyst)  . Hyperprolactinemia ([place])   . Hypertension   . Lipoma   . Migraine   . Personality disorder ([place])   . Pes planus     Past Surgical History:  Procedure Laterality Date  . ANKLE SURGERY  12/88   left   . chest nodule  1990?   rt chest wall nodule removal  . GANGLION CYST EXCISION  2011  . lipoma removal    . right bunioectomy    . SHOULDER SURGERY  01/13/2011   right, partial tear  . tumor resection left thigh      There were no vitals filed for this visit.   Subjective Assessment - 08/06/19 0852    Subjective "I have a ton to do today"    Currently in Pain? Yes    Pain Score 3     Pain Location Neck    Pain Orientation Posterior    Pain Descriptors / Indicators Aching    Pain Type Acute pain    Pain Onset Today                 ADULT SLP TREATMENT - 08/06/19 0903      General Information   Behavior/Cognition Alert;Cooperative;Distractible;Requires cueing        Cognitive-Linquistic Treatment   Treatment focused on Cognition;Patient/family/caregiver education    Skilled Treatment Pt enters room with therapy notebook and mininote book with her to do list. She has consistently filled out for the week . [name] reports this is keeping her organized. She has been cleaning out her room and reducing clutter.  She has written out a week a head including to pick up her meds, errands, work schedule. She utilized synomyn "took" for "confiscate" She is self advocating at work with customers saying" I've had a brain injury, can you speak slower or tell me the number digit  by digit" and having success interacting with customers. Word finding continues to frustrate [name]. This week, [name] will focus on repeating back what others are saying when it is important and advocaate for herself for her conversation partner to speak slower or reduce background noise      Assessment / Recommendations / Plan   Plan Continue with current plan of care      Progression Toward Goals   Progression toward goals  Progressing toward goals            SLP Education - 08/06/19 0931    Education Details compensations for processing and attending to conversation    Person(s) Educated Patient    Methods Explanation;Demonstration;Verbal cues;Handout    Comprehension Verbalized understanding;Need further instruction;Returned demonstration              SLP Long Term Goals - 08/06/19 0936      SLP LONG TERM GOAL #1   Title Pt will utilize 2 compensatory strategies to recall store lists and daily to do's with rare min A over 3 sessions    Time 2    Period Weeks    Status Achieved      SLP LONG TERM GOAL #2   Title pt will use anomia compensations successfully in 15 minutes mod complex conversation over 3 therapy sessions    Time 1    Period Weeks    Status On-going      SLP LONG TERM GOAL #3   Title Pt will utilize compensations for attention, processing and auditory comprehension  when being given instructions, medical or insurance information at home and work with rare min A over 3 sessions    Time 2    Period Weeks    Status On-going      SLP LONG TERM GOAL #4   Title Pt will verbalize anticipatory awareness before making safety decisions at work and household chores with rare min A over 3 sessions    Time 2    Period Weeks    Status On-going            Plan - 08/06/19 0934    Clinical Impression Statement [name] is a 49 y.o female pt with c/o ongoing difficulty with memory and attention since concussion and recent fall where she hit her head. I  recommend short course of ST (9-13 visits) to maximize carryover of compensatory strategie for cognitive linguistic impairments for safety and QOL as well as success at work.    Speech Therapy Frequency 2x / week    Duration --   6 weeks or 13 visits   Treatment/Interventions SLP instruction and feedback;Cueing hierarchy;Environmental controls;Language facilitation;Compensatory techniques;Cognitive reorganization;Functional tasks;Compensatory strategies;Patient/family education;Multimodal communcation approach;Internal/external aids           Patient will benefit from skilled therapeutic intervention in order to improve the following deficits and impairments:   Cognitive communication deficit    Problem List Patient Active Problem List   Diagnosis Date Noted  . 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 Genetic testing 02/27/2018  . Family history of genetic disease carrier   . Family history of breast cancer   . Family history of colon cancer   . Head injury with loss of consciousness ([place]) 11/20/2017  . Nausea 11/20/2017  . 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 Tremor observed on examination 11/20/2017  . Left shoulder pain 02/18/2017  . MDD (major depressive disorder), recurrent severe,  without psychosis ([place]) 12/28/2016  . Muscle strain 09/19/2016  . Right foot injury, subsequent encounter 08/25/2016  . Low back pain 06/27/2016  . Left wrist injury, subsequent encounter 06/27/2016  . Pain of left thumb 10/07/2015  . 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 Strain of left thumb 02/11/2015  . Strain of right forearm 02/11/2015  . Right shoulder pain 12/31/2014  . Injury of right little finger  12/31/2014  . Episodic cluster headache, not intractable 12/02/2014  . 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 Hypersomnia, recurrent 12/02/2014  . Migraine aura, persistent, intractable, with status migrainosus 12/02/2014  . Lower back injury 09/30/2014  . Bipolar I disorder, most recent episode depressed ([place])   . MDD (major depressive disorder), recurrent, severe, with psychosis ([place]) 09/23/2014  . Injury of left index finger 08/20/2014  . Right ankle sprain 06/01/2014  . Contusion, multiple sites 06/01/2014  . Strain of right gastrocnemius muscle 06/01/2014  . Phonophobia 05/04/2014  . Photophobia of both eyes 05/04/2014  . Emotionally unstable borderline personality disorder ([place]) 05/04/2014  . Nausea with vomiting 05/04/2014  . Mixed bipolar I disorder ([place])   . Bipolar I disorder, most recent episode mixed ([place]) 04/18/2014  . Bipolar affective disorder, depressed, mild ([place]) 04/12/2014  . Suicidal ideation 04/12/2014  . Injury of right shoulder and upper arm 02/17/2014  . Left leg pain 06/03/2013  . Right hip pain 06/03/2013  . Migraine with status migrainosus 01/08/2013  . Personality disorder ([place])   . Chronic migraine 05/08/2012  . Contact dermatitis 11/27/2011  . Major depressive disorder, recurrent episode ([place]) 10/27/2011  . Generalized anxiety disorder 10/27/2011  . 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 Borderline personality disorder ([place])  10/27/2011  . Right foot pain 09/28/2011  . 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 Ganglion cyst 09/29/2009  . PES PLANUS 07/01/2008  . BIPOLAR DISORDER UNSPECIFIED 06/09/2008    Peightyn Roberson, Annye Rusk MS, CCC-SLP 08/06/2019, 12:15 PM  [facility] [address] [place], [place], [address] Phone: [phone]   Fax:  [phone]   Name: [name] MRN: [id number] Date of Birth: 05-10-1970

## 2019-08-06 NOTE — Patient Instructions (Signed)
   [name], you doing a great job using strategies to help your attention, processing and organization - very proud of you  Keep up the good work using your mini notebook for weekly to [employer], lists, errands   To help process what people are saying, repeat back what you hear and ask for information in writing.   Make sure you are looking at the person talking  Try to reduce background noise when you are listening (TV, radio, Step away from loud appliances)  People need to get your attention before they talk to you  Let others know that you are having difficulty processing what they are saying with the background noise

## 2019-08-07 ENCOUNTER — Ambulatory Visit: Payer: PPO | Admitting: Speech Pathology

## 2019-08-07 DIAGNOSIS — R41841 Cognitive communication deficit: Secondary | ICD-10-CM

## 2019-08-07 NOTE — Patient Instructions (Signed)
Great job repeating back what you hear; focus on this again tomorrow when you are at work.   Remember, when you are experiencing frustration: pause. Take a moment to choose your response. Unpredictable things will happen: you will forget a word, you may encounter a rude customer. Focus on what you CAN control: what strategy will you use to help you deal with the situation you're in? Remember to "open your toolbox."

## 2019-08-07 NOTE — Therapy (Signed)
[facility] [address] [place], [place], [address] Phone: [phone]   Fax:  [phone]  Speech Language Pathology Treatment  Patient Details  Name: [name] MRN: [id number] Date of Birth: 07-01-1970 Referring Provider (SLP): Trixie Deis NP   Encounter Date: 08/07/2019   End of Session - 08/07/19 1354    Visit Number 13    Number of Visits 13    SLP Start Time 0850    SLP Stop Time  0930    SLP Time Calculation (min) 40 min    Activity Tolerance Patient tolerated treatment well           Past Medical History:  Diagnosis Date  . Achilles tendinitis   . Achilles tendinitis   . ADHD (attention deficit hyperactivity disorder)   . Allergy   . Arthritis   . Bipolar affective ([place])   . Bipolar disorder ([place])   . Cataracts, bilateral   . Eczema   . Family history of breast cancer   . Family history of colon cancer   . Family history of genetic disease carrier   . Ganglion cyst 09/29/2009   left wrist (2 cyst)  . Hyperprolactinemia ([place])   . Hypertension   . Lipoma   . Migraine   . Personality disorder ([place])   . Pes planus     Past Surgical History:  Procedure Laterality Date  . ANKLE SURGERY  12/88   left   . chest nodule  1990?   rt chest wall nodule removal  . GANGLION CYST EXCISION  2011  . lipoma removal    . right bunioectomy    . SHOULDER SURGERY  01/13/2011   right, partial tear  . tumor resection left thigh      There were no vitals filed for this visit.   Subjective Assessment - 08/07/19 0849    Subjective "It's like getting caught with words is... more."                 ADULT SLP TREATMENT - 08/07/19 0851      General Information   Behavior/Cognition Alert;Cooperative;Distractible;Requires cueing      Treatment Provided   Treatment provided Cognitive-Linquistic      Pain Assessment   Pain Assessment No/denies pain      Cognitive-Linquistic Treatment    Treatment focused on Cognition;Patient/family/caregiver education    Skilled Treatment Patient referenced her to do list independently when telling SLP what she has done over the past few days. Reported she used strategy of repeating back what she hears at work; SLP praised her for this and encouraged her to continue her focus on that this week. At times [name] perseverated on events not within her control, such as unexpected rushes at work or [employer] traffic [employer]. SLP pointed out to patient that she used a compensation when walking back to ST room (asked for more time when she couldn't think of a word), and encouraged pt to think about her "tool box" when she is feeling flustered or frustrated. Encouraged her to try to pause, then shift her focus to something she can control, such as using a strategy to help her be more successful. [name] asked whether SLP thinks this is her "new normal." Explained that pt likely to continue to have difficulties, but that she can continue to improve her function by using strategies, and that neuropsych testing may give [place] additional insight into whether she may continue to benefit from [place].  Assessment / Recommendations / Plan   Plan Continue with current plan of care      Progression Toward Goals   Progression toward goals Progressing toward goals            SLP Education - 08/07/19 1353    Education Details when feeling flustered or frustrated, pause and choose a strategy    Person(s) Educated Patient    Methods Explanation    Comprehension Verbalized understanding              SLP Long Term Goals - 08/07/19 1354      SLP LONG TERM GOAL #1   Title Pt will utilize 2 compensatory strategies to recall store lists and daily to do's with rare min A over 3 sessions    Time 2    Period Weeks    Status Achieved      SLP LONG TERM GOAL #2   Title pt will use anomia compensations successfully in 15 minutes mod complex conversation over 3 therapy sessions     Baseline 08/07/19    Time 1    Period Weeks    Status On-going      SLP LONG TERM GOAL #3   Title Pt will utilize compensations for attention, processing and auditory comprehension when being given instructions, medical or insurance information at home and work with rare min A over 3 sessions    Baseline 08/07/19    Time 1    Period Weeks    Status On-going      SLP LONG TERM GOAL #4   Title Pt will verbalize anticipatory awareness before making safety decisions at work and household chores with rare min A over 3 sessions    Time 1    Period Weeks    Status On-going            Plan - 08/07/19 1354    Clinical Impression Statement [name] is a 49 y.o female pt with c/o ongoing difficulty with memory and attention since concussion and recent fall where she hit her head. I  recommend short course of ST (9-13 visits) to maximize carryover of compensatory strategie for cognitive linguistic impairments for safety and QOL as well as success at work.    Speech Therapy Frequency 2x / week    Duration --   6 weeks or 13 visits   Treatment/Interventions SLP instruction and feedback;Cueing hierarchy;Environmental controls;Language facilitation;Compensatory techniques;Cognitive reorganization;Functional tasks;Compensatory strategies;Patient/family education;Multimodal communcation approach;Internal/external aids           Patient will benefit from skilled therapeutic intervention in order to improve the following deficits and impairments:   Cognitive communication deficit    Problem List Patient Active Problem List   Diagnosis Date Noted  . 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 Genetic testing 02/27/2018  . Family history of genetic disease carrier   . Family history of breast cancer   . Family history of colon cancer   . Head injury with loss of consciousness ([place]) 11/20/2017  . Nausea 11/20/2017  . 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 Intractable persistent migraine  aura with cerebral infarction and status migrainosus ([place]) 11/20/2017  . Tremor observed on examination 11/20/2017  . Left shoulder pain 02/18/2017  . MDD (major depressive disorder), recurrent severe, without psychosis ([place]) 12/28/2016  . Muscle strain 09/19/2016  . Right foot injury, subsequent encounter 08/25/2016  . Low back pain 06/27/2016  . Left wrist injury, subsequent encounter 06/27/2016  . Pain of left thumb  10/07/2015  . 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 Strain of left thumb 02/11/2015  . Strain of right forearm 02/11/2015  . Right shoulder pain 12/31/2014  . Injury of right little finger 12/31/2014  . Episodic cluster headache, not intractable 12/02/2014  . 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 Hypersomnia, recurrent 12/02/2014  . Migraine aura, persistent, intractable, with status migrainosus 12/02/2014  . Lower back injury 09/30/2014  . Bipolar I disorder, most recent episode depressed ([place])   . MDD (major depressive disorder), recurrent, severe, with psychosis ([place]) 09/23/2014  . Injury of left index finger 08/20/2014  . Right ankle sprain 06/01/2014  . Contusion, multiple sites 06/01/2014  . Strain of right gastrocnemius muscle 06/01/2014  . Phonophobia 05/04/2014  . Photophobia of both eyes 05/04/2014  . Emotionally unstable borderline personality disorder ([place]) 05/04/2014  . Nausea with vomiting 05/04/2014  . Mixed bipolar I disorder ([place])   . Bipolar I disorder, most recent episode mixed ([place]) 04/18/2014  . Bipolar affective disorder, depressed, mild ([place]) 04/12/2014  . Suicidal ideation 04/12/2014  . Injury of right shoulder and upper arm 02/17/2014  . Left leg pain 06/03/2013  . Right hip pain 06/03/2013  . Migraine with status migrainosus 01/08/2013  . Personality disorder ([place])   . Chronic migraine 05/08/2012  . Contact dermatitis 11/27/2011  . Major depressive disorder,  recurrent episode ([place]) 10/27/2011  . Generalized anxiety disorder 10/27/2011  . 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 Borderline personality disorder ([place]) 10/27/2011  . Right foot pain 09/28/2011  . 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 Ganglion cyst 09/29/2009  . PES PLANUS 07/01/2008  . BIPOLAR DISORDER UNSPECIFIED 06/09/2008   Deneise Lever, Ali Molina, CCC-SLP Speech-Language Pathologist  Aliene Altes 08/07/2019, 1:55 PM  [facility] [address] [place], [place], [address] Phone: [phone]   Fax:  [phone]   Name: KARLEIGH BUNTE MRN: [id number] Date of Birth: December 03, 1970

## 2019-08-11 ENCOUNTER — Ambulatory Visit: Payer: PPO | Admitting: Speech Pathology

## 2019-08-11 ENCOUNTER — Other Ambulatory Visit: Payer: Self-pay

## 2019-08-11 DIAGNOSIS — R41841 Cognitive communication deficit: Secondary | ICD-10-CM | POA: Diagnosis not present

## 2019-08-11 NOTE — Therapy (Signed)
[facility] [address] [place], [place], [address] Phone: [phone]   Fax:  [phone]  Speech Language Pathology Treatment  Patient Details  Name: [name] MRN: [id number] Date of Birth: [birth year] Referring Provider (SLP): Trixie Deis NP   Encounter Date: 08/11/2019   End of Session - 08/11/19 1456    Visit Number 14    Number of Visits 26    Date for SLP Re-Evaluation 11/03/19    Authorization Type Pt on hold until neuropsch completed    SLP Start Time 1404    SLP Stop Time  1446    SLP Time Calculation (min) 42 min    Activity Tolerance Patient tolerated treatment well           Past Medical History:  Diagnosis Date  . Achilles tendinitis   . Achilles tendinitis   . ADHD (attention deficit hyperactivity disorder)   . Allergy   . Arthritis   . Bipolar affective ([place])   . Bipolar disorder ([place])   . Cataracts, bilateral   . Eczema   . Family history of breast cancer   . Family history of colon cancer   . Family history of genetic disease carrier   . Ganglion cyst 09/29/2009   left wrist (2 cyst)  . Hyperprolactinemia ([place])   . Hypertension   . Lipoma   . Migraine   . Personality disorder ([place])   . Pes planus     Past Surgical History:  Procedure Laterality Date  . ANKLE SURGERY  12/88   left   . chest nodule  1990?   rt chest wall nodule removal  . GANGLION CYST EXCISION  2011  . lipoma removal    . right bunioectomy    . SHOULDER SURGERY  01/13/2011   right, partial tear  . tumor resection left thigh      There were no vitals filed for this visit.   Subjective Assessment - 08/11/19 1409    Subjective "I had a horrible experience at the movie theater"    Currently in Pain? Yes    Pain Score 3     Pain Location Head    Pain Descriptors / Indicators Headache    Pain Type Chronic pain                 ADULT SLP TREATMENT - 08/11/19 1416      General  Information   Behavior/Cognition Alert;Cooperative;Distractible;Requires cueing      Treatment Provided   Treatment provided Cognitive-Linquistic      Cognitive-Linquistic Treatment   Treatment focused on Cognition;Patient/family/caregiver education    Skilled Treatment [name] brought out her to do list and added "[employer]" to her list. 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 She is completing neuropsych testing in August. [name] generated a list of information and questions for neurospysch. She reports she verbalizes strategies to ensure auditory processing, and uses them at work, however she reports she forgets them in the moment. Word finding continues to frustrate [name], she is aware of strategies and uses synonyms occasionally.       Assessment / Recommendations / Plan   Plan Continue with current plan of care   New goals to be determined after neuropsych eval           SLP Education - 08/11/19 1450    Education Details continue using the strategies you have learned for attention and memory. Self  advocate    Person(s) Educated Patient    Methods Explanation;Demonstration;Verbal cues;Handout    Comprehension Verbalized understanding;Returned demonstration;Verbal cues required              SLP Long Term Goals - 08/11/19 1454      SLP LONG TERM GOAL #1   Title Pt will utilize 2 compensatory strategies to recall store lists and daily to do's with rare min A over 3 sessions    Time 1    Period Weeks    Status Achieved      SLP LONG TERM GOAL #2   Title pt will use anomia compensations successfully in 15 minutes mod complex conversation over 3 therapy sessions    Time 6    Status On-going      SLP LONG TERM GOAL #3   Title Pt will utilize compensations for attention, processing and auditory comprehension when being given instructions, medical or insurance information at home and work with rare min A over 3 sessions     Time 1    Period Weeks    Status Achieved      SLP LONG TERM GOAL #4   Title Pt will verbalize anticipatory awareness before making safety decisions at work and household chores with rare min A over 3 sessions    Baseline trip planning; itinerary; monthly budget; [employer]    Time 6    Period Days    Status On-going      Additional Long Term Goals   Additional Long Term Goals Yes      SLP LONG TERM GOAL #6   Title TBD following neuropsych testing    Time 6    Period Weeks    Status New            Plan - 08/11/19 1451    Clinical Impression Statement [name] has carried over compensatory strategies of attention and memory at work and home. She continues to report difficulty carrying them over consistently and continues to have difficulty with word finding with inconsistent use of strategies. She is undergoing neuropsych testing next month. Plan to place her on hold until neuropsych tesing complete and results confirmed.Goals may be added/modified after testing. [name] will call to schedule visits. Will request re-certification for ST today.    Speech Therapy Frequency 2x / week    Duration --   12 weeks or 26 visits   Treatment/Interventions SLP instruction and feedback;Cueing hierarchy;Environmental controls;Language facilitation;Compensatory techniques;Cognitive reorganization;Functional tasks;Compensatory strategies;Patient/family education;Multimodal communcation approach;Internal/external aids    Potential to Achieve Goals Good    Potential Considerations Co-morbidities   bipolar, ADHD          Patient will benefit from skilled therapeutic intervention in order to improve the following deficits and impairments:   Cognitive communication deficit    Problem List Patient Active Problem List   Diagnosis Date Noted  . 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 Genetic testing 02/27/2018  . Family history of genetic disease carrier   . Family history of  breast cancer   . Family history of colon cancer   . Head injury with loss of consciousness ([place]) 11/20/2017  . Nausea 11/20/2017  . 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 Tremor observed on examination 11/20/2017  . Left shoulder pain 02/18/2017  . MDD (major depressive disorder), recurrent severe, without psychosis ([place]) 12/28/2016  . Muscle strain 09/19/2016  . Right foot  injury, subsequent encounter 08/25/2016  . Low back pain 06/27/2016  . Left wrist injury, subsequent encounter 06/27/2016  . Pain of left thumb 10/07/2015  . 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 Strain of left thumb 02/11/2015  . Strain of right forearm 02/11/2015  . Right shoulder pain 12/31/2014  . Injury of right little finger 12/31/2014  . Episodic cluster headache, not intractable 12/02/2014  . 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 Hypersomnia, recurrent 12/02/2014  . Migraine aura, persistent, intractable, with status migrainosus 12/02/2014  . Lower back injury 09/30/2014  . Bipolar I disorder, most recent episode depressed ([place])   . MDD (major depressive disorder), recurrent, severe, with psychosis ([place]) 09/23/2014  . Injury of left index finger 08/20/2014  . Right ankle sprain 06/01/2014  . Contusion, multiple sites 06/01/2014  . Strain of right gastrocnemius muscle 06/01/2014  . Phonophobia 05/04/2014  . Photophobia of both eyes 05/04/2014  . Emotionally unstable borderline personality disorder ([place]) 05/04/2014  . Nausea with vomiting 05/04/2014  . Mixed bipolar I disorder ([place])   . Bipolar I disorder, most recent episode mixed ([place]) 04/18/2014  . Bipolar affective disorder, depressed, mild ([place]) 04/12/2014  . Suicidal ideation 04/12/2014  . Injury of right shoulder and upper arm 02/17/2014  . Left leg pain 06/03/2013  . Right hip pain  06/03/2013  . Migraine with status migrainosus 01/08/2013  . Personality disorder ([place])   . Chronic migraine 05/08/2012  . Contact dermatitis 11/27/2011  . Major depressive disorder, recurrent episode ([place]) 10/27/2011  . Generalized anxiety disorder 10/27/2011  . 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 Borderline personality disorder ([place]) 10/27/2011  . Right foot pain 09/28/2011  . 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 Ganglion cyst 09/29/2009  . PES PLANUS 07/01/2008  . BIPOLAR DISORDER UNSPECIFIED 06/09/2008    [name], Annye Rusk MS, CCC-SLP 08/11/2019, 2:58 PM  [facility] [address] [place], [place], [address] Phone: [phone]   Fax:  [phone]   Name: KIMBLERY DIOP MRN: [id number] Date of Birth: [birth year]

## 2019-08-11 NOTE — Patient Instructions (Addendum)
  1st concussion: Oct 2018  2nd concussion: Sept 2019  Light and sound hypersensitivity  Focus/attention  Clumsiness  Memory  Word finding  Train of thought in conversation  Decision making  Spinning  Bipolar/anxiety  ADHD - dx in college   [name], keep up using your to do list, advocating for yourself, using strategies for word finding, setting timer to clean/organize the clutter in your room - just 15 minutes at a time  Write down in your to do list when you are going to pay each bill  Use the tools in your toolbox!! You are doing great - we are proud of you

## 2019-08-16 ENCOUNTER — Other Ambulatory Visit: Payer: Self-pay | Admitting: Adult Health

## 2019-08-20 ENCOUNTER — Other Ambulatory Visit: Payer: Self-pay

## 2019-08-20 ENCOUNTER — Ambulatory Visit: Payer: PPO | Admitting: Adult Health

## 2019-08-20 ENCOUNTER — Other Ambulatory Visit (HOSPITAL_BASED_OUTPATIENT_CLINIC_OR_DEPARTMENT_OTHER): Payer: Self-pay | Admitting: Family Medicine

## 2019-08-20 ENCOUNTER — Ambulatory Visit (HOSPITAL_BASED_OUTPATIENT_CLINIC_OR_DEPARTMENT_OTHER)
Admission: RE | Admit: 2019-08-20 | Discharge: 2019-08-20 | Disposition: A | Discharge status: Home / Self Care | Payer: PPO | Source: Ambulatory Visit | Attending: Family Medicine | Admitting: Family Medicine

## 2019-08-20 DIAGNOSIS — R7989 Other specified abnormal findings of blood chemistry: Secondary | ICD-10-CM | POA: Insufficient documentation

## 2019-08-20 DIAGNOSIS — M79604 Pain in right leg: Secondary | ICD-10-CM | POA: Diagnosis present

## 2019-08-20 DIAGNOSIS — M79661 Pain in right lower leg: Secondary | ICD-10-CM | POA: Diagnosis not present

## 2019-08-28 ENCOUNTER — Ambulatory Visit: Payer: PPO | Admitting: Sports Medicine

## 2019-08-28 ENCOUNTER — Other Ambulatory Visit: Payer: Self-pay

## 2019-08-28 VITALS — BP 114/82 | Ht 69.5 in | Wt 255.0 lb

## 2019-08-28 DIAGNOSIS — M79661 Pain in right lower leg: Secondary | ICD-10-CM | POA: Diagnosis not present

## 2019-08-28 NOTE — Progress Notes (Signed)
Office Visit Note   Patient: [name]           Date of Birth: 1970/06/11           MRN: [id number] Visit Date: 08/28/2019 Requested by: Aretta Nip, MD [place],  [place] [address] PCP: Aretta Nip, MD  Subjective: Right Calf pain   HPI: 49 year old female presenting to clinic today with concerns of posterior right calf pain [id number]. patient denies any trauma or change in activity that preceded the onset of her discomfort.  She states pain is worse with stretching the calf, but it is similarly bad with rest as it is with activity.  She feels as though her right lower leg has been swollen since her pain started, which is most appreciable in the dorsal aspect of her right foot.  She saw her primary care provider who ordered lab work including a D-dimer, which prompted a duplex ultrasound to rule out a DVT.  Patient does endorse an overall sedentary lifestyle.  She has tried heat, icing, and ibuprofen for symptomatic relief, with minimal benefit thus far.  She feels that there is a "knot" on the back of her leg which is where her tenderness is coming from.  Denies any shortness of breath, or any other systemic symptoms to accompany this pain.              ROS:   All other systems were reviewed and are negative.  Objective: Vital Signs: BP 114/82   Ht 5' 9.5" (1.765 m)   Wt (!) 255 lb (115.7 kg)   BMI 37.12 kg/m   Physical Exam:  General:  Alert and oriented, in no acute distress. Pulm:  Breathing unlabored. Psy:  Normal mood, congruent affect. Skin: No rashes or bruising appreciated.  Skin overlying posterior aspect of right calf appears intact. MSK:  Normal gait. Right Calf with palpable area of torturous induration approximately 6cm distal to popliteal fossa, which feels superficial to musculature. She has a similar lesion on the left calf, however only the right is tender to palpation. Area without overlying erythema. Full ROM with knee  flexion/extension without pain. Full ROM in ankle, with plantar/dorsiflexion, and in/eversion without pain. 1+ edema appreciated on dorsal aspect of R foot.  Hommen's negative.  Right calf measures 49.5cm in diameter at widest point.  Left calf measures 47cm at widest point.    Imaging: Limited Extremity Ultrasound: Right Posterior Calf  Gastrocnemius and soleus musculature appropriately visualized under ultrasound, with no obvious intramuscular defects appreciated. Tendons appear intact.   Assessment & Plan: 49yo F presenting to clinic with 3 weeks of sudden R calf pain, with no known trauma or precipitating factors. Examination as above, which is concerning for palpable varicosities throughout posterior calf at the site of her pain. Ultrasound without deeper defects appreciated within the musculature of the calf itself.  Suspect calf strain vs superficial thrombophlebitis. Discussed continued heat/NSAID use for symptomatic control. Will provide heel lift for possibility of calf injury, and recommend physical therapy to help with calf mobilization/strengthening in the hopes of speeding her recovery. Patient is agreeable with plan.   Patient seen and evaluated with the sports medicine fellow.  I agree with the above plan of care.  Ultrasound today shows no evidence of muscular injury or soft tissue mass.  Diagnosis is not straightforward but we will provide her with some heel lifts to help alleviate some pressure from her calves when ambulating.  Recent Doppler was  negative for DVT.  It is possible that she may have thrombophlebitis so we recommend heat and over-the-counter NSAID usage as well.  Patient will follow-up for ongoing or recalcitrant issues.

## 2019-09-04 ENCOUNTER — Telehealth: Payer: Self-pay | Admitting: Sports Medicine

## 2019-09-04 DIAGNOSIS — M79661 Pain in right lower leg: Secondary | ICD-10-CM

## 2019-09-04 NOTE — Telephone Encounter (Signed)
Patient states provider request she call him to report how heel lift was helping to manage calf pain.  --- Per patient when she wears the lift in shoes it No pain but if she walks barefooted or with shoes ( No Lift used) the pain in Rt calf returns.  Just an fyi .  --forwarding message.  Gail/ glh

## 2019-09-16 ENCOUNTER — Ambulatory Visit: Payer: PPO | Admitting: Psychology

## 2019-09-16 ENCOUNTER — Encounter: Payer: Self-pay | Admitting: Psychology

## 2019-09-16 ENCOUNTER — Ambulatory Visit (INDEPENDENT_AMBULATORY_CARE_PROVIDER_SITE_OTHER): Payer: PPO | Admitting: Psychology

## 2019-09-16 ENCOUNTER — Other Ambulatory Visit: Payer: Self-pay

## 2019-09-16 DIAGNOSIS — F3160 Bipolar disorder, current episode mixed, unspecified: Secondary | ICD-10-CM | POA: Diagnosis not present

## 2019-09-16 DIAGNOSIS — F411 Generalized anxiety disorder: Secondary | ICD-10-CM

## 2019-09-16 DIAGNOSIS — R41840 Attention and concentration deficit: Secondary | ICD-10-CM

## 2019-09-16 DIAGNOSIS — R4189 Other symptoms and signs involving cognitive functions and awareness: Secondary | ICD-10-CM

## 2019-09-16 NOTE — Telephone Encounter (Signed)
Spoke with pt about her continued calf pain despite using the heel lifts. We will go ahead with a referral for PT. Pt agrees with the plan. Referral placed.

## 2019-09-16 NOTE — Progress Notes (Addendum)
NEUROPSYCHOLOGICAL EVALUATION [facility]  Date of Evaluation: September 16, 2019  Reason for Referral:   [name] is a 49 y.o. right-handed Caucasian female referred by Ward Givens, NP, to characterize her current cognitive functioning and assist with diagnostic clarity and treatment planning in the context of subjective cognitive decline, several psychiatric comorbidities, and a history of multiple concussions.   Assessment and Plan:   Clinical Impression(s): Scores across performance validity instruments were variable and current results should be interpreted with a mild degree of caution. However, if taken at face value, [name]' pattern of performance is suggestive of notable performance variability across numerous cognitive domains. Despite this, relative weaknesses were exhibited across processing speed, executive functioning, semantic fluency, and encoding/retrieval aspects of memory. Performance was appropriate across attention/concentration, receptive language, phonemic fluency, confrontation naming, visuospatial abilities, and retention rates across memory measures. [name] denied difficulties completing instrumental activities of daily living (ADLs) independently. Across mood questionnaires, she reported moderate levels of acute anxiety and mild symptoms of depression.  The etiology for cognitive weaknesses is likely multifactorial in nature. 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 However, despite benefits from stimulant medications during her time in college, this medication was discontinued after her graduation (2014) for an unknown reason. As such, this condition appears untreated. Performance variability across cognitive testing, especially across domains of processing speed, executive functioning, and learning and memory would be expected with untreated ADHD and her performance is  consistent with this presentation. It is also likely that ongoing mood related concerns, especially those surrounding anxiety, exacerbate an already dysregulated underlying attentional network. Bipolar disorder also is commonly associated with cognitive deficits in similar domains described above. Overall, the most likely culprit for ongoing cognitive weaknesses is her history of untreated ADHD combined with her history of bipolar disorder and acute mood-related stressors. Cognitive performances do not raise concern for an early-onset neurodegenerative illness at the present time. Continued medical monitoring will be important moving forward.   Recommendations: As stated above, it is unclear why stimulant medications were discontinued following her graduation from college as her use of these medications was described as beneficial and without significant side effects. I would recommend that [name] discuss the resumption of a stimulant medication with her PCP/psychiatrist.   A combination of medication and psychotherapy has been shown to be most effective at treating symptoms of anxiety and depression. As such, [name] is also encouraged to speak with her prescribing physician regarding medication adjustments to optimally manage these symptoms, namely ongoing anxiety. As she reported being actively engaged in outpatient psychotherapy, she is encouraged to maintain weekly sessions.  [name] reported completing a laboratory sleep study several years prior which suggested "borderline" sleep apnea. She could consider pursuing an updated sleep study to see if she meets diagnostic criteria for sleep apnea and CPAP treatment. She did describe snoring behaviors and non-restful sleep despite obtaining 7-10 hours per night, both of which are red flags for sleep apnea. If present and untreated, this condition would increase her risk for stroke, heart attack, and future cognitive decline.   Records suggest  that [name]' outpatient speech therapy was put on hold pending the results of the current evaluation. As she reported that these sessions were beneficial in the development of coping strategies to address cognitive inefficiency, I would encourage her to continue with these sessions. It is hopeful that this, combined with the resumption of stimulant  medications, would allow her the greatest opportunity to learn how to maximize her cognitive efficiency in her day-to-day life.   A future repeat neuropsychological evaluation could be considered should her ADHD and psychiatric symptoms become well managed and she continue to experience concerns surrounding cognitive decline. The current evaluation will serve as a baseline to compare any future evaluations against.  [name] is encouraged to attend to lifestyle factors for brain health (e.g., regular physical exercise, good nutrition habits, regular participation in cognitively-stimulating activities, and general stress management techniques), which are likely to have benefits for both emotional adjustment and cognition. In fact, in addition to promoting good general health, regular exercise incorporating aerobic activities (e.g., brisk walking, jogging, cycling, etc.) has been demonstrated to be a very effective treatment for depression and stress, with similar efficacy rates to both antidepressant medication and psychotherapy. Optimal control of vascular risk factors (including safe cardiovascular exercise and adherence to dietary recommendations) is encouraged.   If interested, there are some activities which have therapeutic value and can be useful in keeping her cognitively stimulated. For suggestions, [name] is encouraged to go to the following website: https://www.[facility].org/get-to-know-barrow/centers-programs/neurorehabilitation-center/neuro-rehab-apps-and-games/ which has options, categorized by level of difficulty. It should be noted that  these activities should not be viewed as a substitute for therapy.  When learning new information, she would benefit from information being broken up into small, manageable pieces. She may also find it helpful to articulate the material in her own words and in a context to promote encoding at the onset of a new task. This material may need to be repeated multiple times to promote encoding.  Memory can be improved using internal strategies such as rehearsal, repetition, chunking, mnemonics, association, and imagery. External strategies such as written notes in a consistently used memory journal, visual and nonverbal auditory cues such as a calendar on the refrigerator or appointments with alarm, such as on a cell phone, can also help maximize recall.    To address problems with processing speed, she may wish to consider:   -Ensuring that she is alerted when essential material or instructions are being presented   -Adjusting the speed at which new information is presented   -Allowing for more time in comprehending, processing, and responding in conversation  To address problems with fluctuating attention, she may wish to consider:   -Avoiding external distractions when needing to concentrate   -Limiting exposure to fast paced environments with multiple sensory demands   -Writing down complicated information and using checklists   -Attempting and completing one task at a time (i.e., no multi-tasking)   -Verbalizing aloud each step of a task to maintain focus   -Reducing the amount of information considered at one time  Review of Records:   [name] was seen by [facility] Ward Givens, NP) on 07/21/2019 for follow-up of post-concussion syndrome and migraine headaches. She reported that Toradol injections had greatly improved headache/migraine symptoms. She continues to take Depakote and Emgality and her next Emgality injection is February 10.She reported ongoing chronic  dizziness. She also reported some memory issues but is unsure if it is just "brain fog." She described occasions where she forgets what she goes into a room for, often to remember it later on. She also described often losing her train of thought or is unable to find the right word to say. While working at [employer], she reported instances where she will have a hard time processing a customer provided email address. She has  bene working with speech therapy which was described as helpful. There is a history of ADHD, inattentive type. She was on Adderall while in school; however, her psychiatrist discontinued this medication after she graduated for unknown reasons. Ultimately, [name] was referred for a comprehensive neuropsychological evaluation to characterize her cognitive abilities and to assist with diagnostic clarity and treatment planning.   Brain MRI on 07/10/2013 was unremarkable. Head CT on 11/01/2017 in the context of a fall was negative.   Past Medical History:  Diagnosis Date  . Achilles tendinitis   . ADHD (attention deficit hyperactivity disorder), inattentive type    Diagnosed as an adult after starting college  . Allergy   . Arthritis   . Bipolar I disorder 04/18/2014   most recent episode mixed  . Borderline personality disorder 10/27/2011  . Cataracts, bilateral   . Chronic pain disorder    due to several injuries affecting numerous areas of her body throughout the years  . 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 Eczema   . Episodic cluster headache, not intractable 12/02/2014  . Family history of genetic disease carrier   . Ganglion cyst 09/29/2009   left wrist (2 cyst)  . Generalized anxiety disorder 10/27/2011  . History of multiple concussions    October 2018 and September 2019  . Hyperprolactinemia   . Hypertension   . Insomnia 02/17/2015  . Lipoma   . 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 Migraine with status migrainosus 01/08/2013  . 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 Pes planus   . Photophobia of both eyes 05/04/2014    Past Surgical History:  Procedure Laterality Date  . ANKLE SURGERY  12/88   left   . chest nodule  1990?   rt chest wall nodule removal  . GANGLION CYST EXCISION  2011  . lipoma removal    . right bunioectomy    . SHOULDER SURGERY  01/13/2011   right, partial tear  . tumor resection left thigh      Current Outpatient Medications:  .  CALCIUM PO, Take 1 tablet by mouth in the morning and at bedtime., Disp: , Rfl:  .  Cholecalciferol (VITAMIN D3 PO), Take 1 capsule by mouth daily., Disp: , Rfl:  .  clobetasol cream (TEMOVATE) 0.05 %, , Disp: , Rfl: 3 .  Cyanocobalamin (B-12 PO), Take 1 tablet by mouth daily., Disp: , Rfl:  .  diclofenac (VOLTAREN) 75 MG EC tablet, TAKE 1 TABLET(75 MG) BY MOUTH TWICE DAILY, Disp: 60 tablet, Rfl: 1 .  divalproex (DEPAKOTE ER) 500 MG 24 hr tablet, TAKE 2 TABLETS BY MOUTH AT BEDTIME FOR MOOD, Disp: 60 tablet, Rfl: 5 .  Galcanezumab-gnlm (EMGALITY) 120 MG/ML SOAJ, Inject 120 mg into the skin every 30 (thirty) days., Disp: 1 mL, Rfl: 11 .  hydrOXYzine (VISTARIL) 50 MG capsule, Take 50 mg by mouth in the morning and at bedtime. , Disp: , Rfl: 2 .  lamoTRIgine (LAMICTAL) 150 MG tablet, Take 300 mg by mouth at bedtime., Disp: , Rfl: 1 .  LATUDA 20 MG TABS tablet, Take 20 mg by mouth every morning., Disp: , Rfl:  .  levocetirizine (XYZAL) 5 MG tablet, Take 5 mg by mouth every evening., Disp: , Rfl: 1 .  lurasidone (LATUDA) 80 MG TABS tablet, Take 1 tablet (80 mg  total) by mouth daily with supper. For mood control, Disp: 30 tablet, Rfl: 0 .  metoprolol succinate (TOPROL-XL) 100 MG 24 hr tablet, Take 1 tablet (100 mg total) by mouth daily. Take with or immediately following a meal., Disp: 30 tablet, Rfl: 0 .   Multiple Vitamin (MULTIVITAMIN WITH MINERALS) TABS tablet, Take 1 tablet by mouth daily., Disp: , Rfl:  .  Multiple Vitamins-Minerals (ZINC PO), Take 1 tablet by mouth daily., Disp: , Rfl:  .  promethazine (PHENERGAN) 25 MG tablet, TK 1 T PO BID FOR 15 DAYS PRN (Patient taking differently: Take 25 mg by mouth 2 (two) times daily as needed for nausea or vomiting. TK 1 T PO BID FOR 15 DAYS PRN), Disp: 30 tablet, Rfl: 0 .  SUMAtriptan Succinate Refill 6 MG/0.5ML SOCT, Inject 0.5 ml SQ at the onset of migraine. May repeat in 2 hours if needed. (Patient taking differently: Inject 0.5 mLs into the skin as needed (migraine). Inject 0.5 ml SQ at the onset of migraine. May repeat in 2 hours if needed.), Disp: 0.5 mL, Rfl: 5 .  traZODone (DESYREL) 100 MG tablet, Take 100 mg by mouth at bedtime., Disp: , Rfl:  .  traZODone (DESYREL) 50 MG tablet, Take 1 tablet (50 mg total) by mouth at bedtime as needed for sleep., Disp: 30 tablet, Rfl: 0  Current Facility-Administered Medications:  .  methylPREDNISolone acetate (DEPO-MEDROL) injection 40 mg, 40 mg, Intra-articular, Once, Hudnall, Shane R, MD .  valproate (DEPACON) 1,000 mg in dextrose 5 % 50 mL IVPB, 1,000 mg, Intravenous, Once, Dohmeier, Asencion Partridge, MD  Clinical Interview:   Cognitive Symptoms: Decreased short-term memory: Endorsed. She reported primary difficulties surrounding losing her train of thought and trouble recalling the details of previous conversations. Difficulties were said to emerge over the past four moths and have seemed stable over that time. Despite more recent onset, [name] attributed these deficits to her remote concussive events.  Decreased long-term memory: Denied. Decreased attention/concentration: Endorsed. She acknowledged a history of the inattentive subtype of ADHD diagnosed upon starting college. She noted that she was prescribed Adderall which was effective at managing these symptoms. However, after graduation in 2014, her  psychiatrist discontinued this medication for unknown reasons. As such, this condition appears untreated at the present time. Acutely, she described deficits in sustained attention and increased ease of distractibility.  Reduced processing speed: Endorsed. She reported instances where her mind feels foggy and she has trouble processing information quickly.  Difficulties with executive functions: Endorsed. She described longstanding difficulties with organization, as well as increased indecisiveness. More recently deficits with multi-tasking were also noted. She described some impulsivity, generally in the form of impulsive shopping. Overt personality changes were denied.  Difficulties with emotion regulation: Denied. Difficulties with receptive language: Endorsed. However, difficulties generally seemed related to trouble with processing speed and attention/concentration rather than comprehension.  Difficulties with word finding: Endorsed. Decreased visuoperceptual ability: Denied.   Difficulties completing ADLs: Denied.  Additional Medical History: History of traumatic brain injury/concussion: Endorsed. In October 2018, she reported being hit in the head by a falling light she was attempting to fix. In September 2019, she reported falling out of bed and hitting her head on a nearby bookshelf. In each instance, she denied a loss in consciousness or significant retrograde or post-traumatic amnesia. She did report feeling dazed and disoriented for a time following her injuries. She commented that her medical doctors diagnosed her with a concussion in each instance. She noted completing neuro-rehabilitation following her  second injury.  History of stroke: Denied. History of seizure activity: Denied. History of known exposure to toxins: Denied. Symptoms of chronic pain: Endorsed. She reported fairly diffuse chronic pain due to a history of numerous orthopedic injuries surrounding many different parts of  her body. Acutely, she described injuries to her shoulder, foot, and wrist which were creating some discomfort. However, symptoms were generally described as manageable.  Experience of frequent headaches/migraines: Endorsed. She reported a history of significant and severe migraine headaches, sometimes lasting 9 days in a row. She estimated having at least 15 migraine days per month in the past. However, since receiving Emgality injections, she reported that these symptoms are far more manageable, never last more than a day, and occur approximately five times per month.  Frequent instances of dizziness/vertigo: Endorsed. She described a sensation where it feels like she is in a dropping elevator. She also reported symptoms occurring when standing up quickly.   Sensory changes: She wears reading glasses but largely denied acute difficulties with her vision. She likewise denied other sensory changes/difficulties affecting her hearing, taste, or smell.  Balance/coordination difficulties: Endorsed. She described her balance as "not great." She noted feeling unsteady on her feet more so than experiencing physical weakness. One side of the body was not said to be worse than the other. Acutely, some of these difficulties were attributed to a recent right foot injury. Her most recent fall was said to have occurred this past February. She also described a somewhat longstanding pattern of bumping into things while ambulating. This was said to be present since her concussive injuries. Other motor difficulties: She reported mild tremulous behaviors in her upper extremities largely linked to significant anxiety or the presence of various stressors.  Sleep History: Estimated hours obtained each night: 7-10 hours.  Difficulties falling asleep: Endorsed. However, these have been noticeably improved since taking Trazodone and other sleep-assisting medications.  Difficulties staying asleep: Denied. Feels rested and  refreshed upon awakening: Denied. She reported still feeling tired upon awakening.   History of snoring: Endorsed. History of waking up gasping for air: Endorsed. Witnessed breath cessation while asleep: Endorsed. She reported previously completing a laboratory sleep study many years prior. Results were said to show "borderline" sleep apnea. Ultimately, it was decided to not utilize CPAP treatment.   History of vivid dreaming: Denied. Excessive movement while asleep: Denied. Instances of acting out her dreams: Denied.  Psychiatric/Behavioral Health History: Depression: She reported a history of bipolar I disorder with a history of rapid cycling and mixed episodes. Her most recent episode was said to have occurred approximately three weeks prior and lasted for one week. This episode had symptoms of both manic and depressive events, including generally passive suicidal ideation. Overall, she reported her belief that bipolar symptoms are managed well and that her mood is generally "pretty good." Regarding suicidal ideation, she remarked that these thoughts are always in the back of her mind and have a tendency to be brought to the front during periods of affective instability. This was said to have been ongoing for years. She reported one previous suicide attempt in 2012 where she walked across a road with the intention of being struck by a vehicle. Current suicidal ideation, intent, or plan was denied. She reported working with a therapist weekly and described a very positive relationship with this clinician.  Anxiety: Symptoms of generalized anxiety were also said to be longstanding in nature and have created significant difficulties in the past. Currently, these symptoms were said  to be well managed via oral medications and ongoing therapy.  Mania: Endorsed (see above).  Trauma History: Denied. Visual/auditory hallucinations: Denied. Delusional thoughts: Denied.  Tobacco: Denied. Alcohol: She  denied current alcohol consumption as well as a history of problematic alcohol abuse or dependence.  Recreational drugs: Denied. Caffeine: She reported very rare coffee consumption but did note drinking up to three sodas per day.   Family History: Problem Relation Age of Onset  . Hypertension Mother   . Hyperlipidemia Mother   . Breast cancer Mother 76       genetic testing- CHEK2 likely pathogenic variant  . Heart attack Father   . Heart disease Father   . Hypertension Father   . Bipolar disorder Father   . Diabetes Paternal Grandfather   . Heart disease Maternal Aunt   . Breast cancer Maternal Aunt 65  . Heart disease Maternal Grandmother   . Colon cancer Maternal Grandmother 66  . Colon cancer Maternal Uncle 75  . Lymphoma Maternal Grandfather 75  . Breast cancer Maternal Aunt 65  . Breast cancer Maternal Aunt 35  . Breast cancer Maternal Aunt   . Bone cancer Cousin 29  . Cervical cancer Cousin        Genetic testing- 'was positive for a gene' had a mastectomy   This information was confirmed by [name]. Schnepp.  Academic/Vocational History: Highest level of educational attainment: 16 years. She completed high school and earned a Water quality scientist degree in recreation and parks management from [employer], graduating in 2014. 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 Science-based courses were described as a potential relative weaknesses.   History of developmental delay: Denied. History of grade repetition: Denied. Enrollment in special education courses: Denied. 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   Employment: She reported previously receiving disability benefits due to her history of psychiatric distress. 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 She expressed a desire to up  her weekly hours but expressed some concern surrounding added stress.   Evaluation Results:   Behavioral Observations: [name] was unaccompanied, arrived to her appointment on time, and was appropriately dressed and groomed. She appeared alert and oriented. Observed gait and station were within normal limits. Gross motor functioning appeared intact upon informal observation and no abnormal movements (e.g., tremors) were noted. Her affect was somewhat anxious at the start of the interview. However, she became relaxed and positive as the interview progressed. Spontaneous speech was fluent and word finding difficulties were not observed during the clinical interview. Thought processes were coherent, organized, and normal in content. Insight into her cognitive difficulties appeared adequate. During testing, sustained attention was appropriate. Task engagement was adequate and she persisted when challenged. Overall, [name] was cooperative with the clinical interview and subsequent testing procedures.   Adequacy of Effort: The validity of neuropsychological testing is limited by the extent to which the individual being tested may be assumed to have exerted adequate effort during testing. [name] expressed her intention to perform to the best of her abilities and exhibited adequate task engagement and persistence. Scores across stand-alone and embedded performance validity measures were variable but more often within expectation. As such, the results of the current evaluation should be interpreted with a mild degree of caution.  Test Results: [name] was fully oriented at the time of  the current evaluation.  Intellectual abilities based upon educational and vocational attainment were estimated to be in the average range. Premorbid abilities were estimated to be within the above average range based upon a single-word reading test.   Processing speed was exceptionally low to below average. Basic  attention was average. More complex attention (e.g., working memory) was also average. Executive functioning was variable, ranging from the well below average to above average normative ranges.  Assessed receptive language abilities were average. [name] did not exhibit any difficulties comprehending task instructions and answered all questions asked of her appropriately. Assessed expressive language was variable. Phonemic fluency was below average to average, semantic fluency was exceptionally low to below average, and confrontation naming was average.     Assessed visuospatial/visuoconstructional abilities were below average to average. Points were lost on her drawing of a clock due to her placing her numbers in a somewhat elliptical fashion in the interior of the clock face rather than on the outer edges.   Learning (i.e., encoding) of novel verbal and visual information was variable, ranging from the exceptionally low to average normative ranges. Spontaneous delayed recall (i.e., retrieval) of previously learned information was also exceptionally low to average. Retention rates were 88% across a story learning task, 100% across a list learning task, and 80% across a shape learning task. Performance across recognition tasks was exceptionally low to average, suggesting some evidence for information consolidation.   Results of emotional screening instruments suggested that recent symptoms of generalized anxiety were in the moderate range, while symptoms of depression were within the mild range. A screening instrument assessing recent sleep quality suggested the presence of minimal sleep dysfunction. On a concussion symptom questionnaire, she reported primary difficulties with attentional dysregulation, memory dysfunction, and noise sensitivity.   Tables of Scores:   Note: This summary of test scores accompanies the interpretive report and should not be considered in isolation without reference to the  appropriate sections in the text. Descriptors are based on appropriate normative data and may be adjusted based on clinical judgment. The terms "impaired" and "within normal limits (WNL)" are used when a more specific level of functioning cannot be determined.       Effort Testing:   DESCRIPTOR       ACS Word Choice: --- --- Below Expectation  Dot Counting Test: --- --- Within Expectation  NAB EVI: --- --- Within Expectation  D-KEFS Color Word Effort Index: --- --- Within Expectation       Orientation:      Raw Score Percentile   NAB Orientation, Form 1 29/29 --- ---       Cognitive Screening:           Raw Score Percentile   SLUMS: 23/30 --- ---       Intellectual Functioning:           Standard Score Percentile   Test of Premorbid Functioning: 114 82 Above Average       Memory:          NAB Memory Module, Form 2: Standard Score/ T Score Percentile   List Learning       Total Trials 1-3 13/36 (19) <1 Exceptionally Low    List B 4/12 (39) 14 Below Average    Short Delay Free Recall 7/12 (35) 7 Well Below Average    Long Delay Free Recall 7/12 (36) 8 Well Below Average    Retention Percentage 100 (51) 54 Average    Recognition Discriminability 4 (34)  5 Well Below Average  Shape Learning       Total Trials 1-3 11/27 (27) 1 Exceptionally Low    Delayed Recall 4/9 (29) 2 Exceptionally Low    Retention Percentage 80 (43) 25 Average    Recognition Discriminability 3 (22) <1 Exceptionally Low  Story Learning       Immediate Recall 47/80 (40) 16 Below Average    Delayed Recall 22/40 (38) 12 Below Average    Retention Percentage 88 (49) 46 Average  Daily Living Memory       Immediate Recall 45/51 (45) 31 Average    Delayed Recall 17/17 (54) 66 Average    Retention Percentage 100 (58) 79 Above Average    Recognition Hits 9/10 (49) 46 Average       Attention/Executive Function:          Trail Making Test (TMT): Raw Score (T Score) Percentile     Part A 46 secs.,  0 errors  (29) 2 Exceptionally Low    Part B 84 secs.,  0 errors (38) 12 Below Average         Scaled Score Percentile   WAIS-IV Coding: 6 9 Below Average       NAB Attention Module, Form 1: T Score Percentile     Digits Forward 51 54 Average    Digits Backwards 48 42 Average       D-KEFS Color-Word Interference Test: Raw Score (Scaled Score) Percentile     Color Naming 43 secs. (4) 2 Well Below Average    Word Reading 29 secs. (6) 9 Below Average    Inhibition 66 secs. (7) 16 Below Average      Total Errors 0 errors (12) 75 Above Average    Inhibition/Switching 59 secs. (11) 63 Average      Total Errors 1 error (11) 63 Average       D-KEFS Verbal Fluency Test: Raw Score (Scaled Score) Percentile     Letter Total Correct 35 (9) 37 Average    Category Total Correct 30 (6) 9 Below Average    Category Switching Total Correct 14 (10) 50 Average    Category Switching Accuracy 13 (11) 63 Average      Total Set Loss Errors 0 (13) 84 Above Average      Total Repetition Errors 1 (11) 63 Average       D-KEFS 20 Questions Test: Scaled Score Percentile     Total Weighted Achievement Score 11 63 Average    Initial Abstraction Score 13 84 Above Average       [place] Card Sorting Test: Raw Score Percentile     Categories (trials) 1 (64) 6-10 Well Below Average    Total Errors 30 3 Well Below Average    Perseverative Errors 14 5 Well Below Average    Non-Perseverative Errors 16 3 Well Below Average    Failure to Maintain Set 1 --- ---       Language:          Verbal Fluency Test: Raw Score (T Score) Percentile     Phonemic Fluency (FAS) 35 (40) 16 Below Average    Animal Fluency 13 (26) 1 Exceptionally Low        NAB Language Module, Form 1: T Score Percentile     Auditory Comprehension 53 62 Average    Naming 30/31 (52) 58 Average       Visuospatial/Visuoconstruction:      Raw Score Percentile   Clock Drawing: 8/10 ---  Within Normal Limits       NAB Spatial Module, Form 1: T Score  Percentile     Figure Drawing Copy 45 31 Average    Figure Drawing Immediate Recall 48 42 Average        Scaled Score Percentile   WAIS-IV Block Design: 7 16 Below Average  WAIS-IV Matrix Reasoning: 11 63 Average       Mood and Personality:      Raw Score Percentile   Beck Depression Inventory - II: 17 --- Mild  PROMIS Anxiety Questionnaire: 25 --- Moderate       Additional Questionnaires:      Raw Score Percentile   PROMIS Sleep Disturbance Questionnaire: 17 --- None to Slight       [place] Postconcussion Symptom Inventory: Raw Score Percentile   Total Score 23 --- Extremely High    Headaches 1 --- Mild    Dizziness/Light-headed 2 --- Mild    Nausea/Feeling Sick 0 --- None    Fatigue 2 --- Mild    Extra Sensitive to Noises 3 --- Moderate or Greater    Irritable 2 --- Mild    Feeling Sad 2 --- Mild    Nervous or Tense 1 --- Mild    Temper Problems 1 --- Mild    Poor Concentration 3 --- Moderate or Greater    Memory Problems 3 --- Moderate or Greater    Difficulty Reading 2 --- Mild    Poor Sleep 1 --- Mild   Informed Consent and Coding/Compliance:   [name]. Borgwardt was provided with a verbal description of the nature and purpose of the present neuropsychological evaluation. Also reviewed were the foreseeable risks and/or discomforts and benefits of the procedure, limits of confidentiality, and mandatory reporting requirements of this provider. The patient was given the opportunity to ask questions and receive answers about the evaluation. Oral consent to participate was provided by the patient.   This evaluation was conducted by Christia Reading, Ph.D., licensed clinical neuropsychologist. [name]. Ostrum completed a comprehensive clinical interview with Dr. Melvyn Novas, billed as one unit [phone], and 155 minutes of cognitive testing and scoring, billed as one unit [phone] and four additional units [id number]. Psychometrist Milana Kidney, B.S., assisted Dr. Melvyn Novas with test administration and  scoring procedures. As a separate and discrete service, Dr. Melvyn Novas spent a total of 160 minutes in interpretation and report writing billed as one unit [phone] and two units [id number].

## 2019-09-16 NOTE — Patient Instructions (Signed)
Clinical Impression(s): Scores across performance validity instruments were variable and current results should be interpreted with a mild degree of caution. However, if taken at face value, [name] pattern of performance is suggestive of notable performance variability across numerous cognitive domains. Despite this, relative weaknesses were exhibited across processing speed, executive functioning, semantic fluency, and encoding/retrieval aspects of memory. Performance was appropriate across attention/concentration, receptive language, phonemic fluency, confrontation naming, visuospatial abilities, and retention rates across memory measures. [name] denied difficulties completing instrumental activities of daily living (ADLs) independently. Across mood questionnaires, she reported moderate levels of acute anxiety and mild symptoms of depression.  The etiology for cognitive weaknesses is likely multifactorial in nature. 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 However, despite benefits from stimulant medications during her time in college, this medication was discontinued after her graduation (2014) for an unknown reason. As such, this condition appears untreated. Performance variability across cognitive testing, especially across domains of processing speed, executive functioning, and learning and memory would be expected with untreated ADHD and her performance is consistent with this presentation. It is also likely that ongoing mood related concerns, especially those surrounding anxiety, exacerbate an already dysregulated underlying attentional network. Bipolar disorder also is commonly associated with cognitive deficits in similar domains described above. Overall, the most likely culprit for ongoing cognitive weaknesses is her history of untreated ADHD combined with her history of bipolar disorder and acute mood-related stressors. Cognitive performances do not raise  concern for an early-onset neurodegenerative illness at the present time. Continued medical monitoring will be important moving forward.

## 2019-09-16 NOTE — Progress Notes (Signed)
   Psychometrician Note   Cognitive testing was administered to [name] by Milana Kidney, B.S. (psychometrist) under the supervision of Dr. Christia Reading, Ph.D., licensed psychologist on 09/16/19. [name] did not appear overtly distressed by the testing session per behavioral observation or responses across self-report questionnaires. Dr. Christia Reading, Ph.D. checked in with [name] as needed to manage any distress related to testing procedures (if applicable). Rest breaks were offered.    The battery of tests administered was selected by Dr. Christia Reading, Ph.D. with consideration to [name]'s current level of functioning, the nature of her symptoms, emotional and behavioral responses during interview, level of literacy, observed level of motivation/effort, and the nature of the referral question. This battery was communicated to the psychometrist. Communication between Dr. Christia Reading, Ph.D. and the psychometrist was ongoing throughout the evaluation and Dr. Christia Reading, Ph.D. was immediately accessible at all times. Dr. Christia Reading, Ph.D. provided supervision to the psychometrist on the date of this service to the extent necessary to assure the quality of all services provided.    [name] will return within approximately 1-2 weeks for an interactive feedback session with Dr. Melvyn Novas at which time her test performances, clinical impressions, and treatment recommendations will be reviewed in detail. [name] understands she can contact our office should she require our assistance before this time.  A total of 155 minutes of billable time were spent face-to-face with [name] by the psychometrist. This includes both test administration and scoring time. Billing for these services is reflected in the clinical report generated by Dr. Christia Reading, Ph.D..  This note reflects time spent with the psychometrician and does not include test scores or any clinical  interpretations made by Dr. Melvyn Novas. The full report will follow in a separate note.

## 2019-09-23 ENCOUNTER — Other Ambulatory Visit: Payer: Self-pay

## 2019-09-23 ENCOUNTER — Ambulatory Visit (INDEPENDENT_AMBULATORY_CARE_PROVIDER_SITE_OTHER): Payer: PPO | Admitting: Psychology

## 2019-09-23 DIAGNOSIS — F3160 Bipolar disorder, current episode mixed, unspecified: Secondary | ICD-10-CM

## 2019-09-23 DIAGNOSIS — F411 Generalized anxiety disorder: Secondary | ICD-10-CM | POA: Diagnosis not present

## 2019-09-23 DIAGNOSIS — R41840 Attention and concentration deficit: Secondary | ICD-10-CM

## 2019-09-23 NOTE — Patient Instructions (Signed)
Recommendations: As stated above, it is unclear why stimulant medications were discontinued following her graduation from college as her use of these medications was described as beneficial and without significant side effects. I would recommend that [name] discuss the resumption of a stimulant medication with her PCP/psychiatrist.   A combination of medication and psychotherapy has been shown to be most effective at treating symptoms of anxiety and depression. As such, [name] is also encouraged to speak with her prescribing physician regarding medication adjustments to optimally manage these symptoms, namely ongoing anxiety. As she reported being actively engaged in outpatient psychotherapy, she is encouraged to maintain weekly sessions.  [name] reported completing a laboratory sleep study several years prior which suggested "borderline" sleep apnea. She could consider pursuing an updated sleep study to see if she meets diagnostic criteria for sleep apnea and CPAP treatment. She did describe snoring behaviors and non-restful sleep despite obtaining 7-10 hours per night, both of which are red flags for sleep apnea. If present and untreated, this condition would increase her risk for stroke, heart attack, and future cognitive decline.   Records suggest that [name]' outpatient speech therapy was put on hold pending the results of the current evaluation. As she reported that these sessions were beneficial in the development of coping strategies to address cognitive inefficiency, I would encourage her to continue with these sessions. It is hopeful that this, combined with the resumption of stimulant medications, would allow her the greatest opportunity to learn how to maximize her cognitive efficiency in her day-to-day life.   A future repeat neuropsychological evaluation could be considered should her ADHD and psychiatric symptoms become well managed and she continue to experience concerns  surrounding cognitive decline. The current evaluation will serve as a baseline to compare any future evaluations against.  [name] is encouraged to attend to lifestyle factors for brain health (e.g., regular physical exercise, good nutrition habits, regular participation in cognitively-stimulating activities, and general stress management techniques), which are likely to have benefits for both emotional adjustment and cognition. In fact, in addition to promoting good general health, regular exercise incorporating aerobic activities (e.g., brisk walking, jogging, cycling, etc.) has been demonstrated to be a very effective treatment for depression and stress, with similar efficacy rates to both antidepressant medication and psychotherapy. Optimal control of vascular risk factors (including safe cardiovascular exercise and adherence to dietary recommendations) is encouraged.   If interested, there are some activities which have therapeutic value and can be useful in keeping her cognitively stimulated. For suggestions, [name] is encouraged to go to the following website: https://www.[facility].org/get-to-know-barrow/centers-programs/neurorehabilitation-center/neuro-rehab-apps-and-games/ which has options, categorized by level of difficulty. It should be noted that these activities should not be viewed as a substitute for therapy.  When learning new information, she would benefit from information being broken up into small, manageable pieces. She may also find it helpful to articulate the material in her own words and in a context to promote encoding at the onset of a new task. This material may need to be repeated multiple times to promote encoding.  Memory can be improved using internal strategies such as rehearsal, repetition, chunking, mnemonics, association, and imagery. External strategies such as written notes in a consistently used memory journal, visual and nonverbal auditory cues such as a  calendar on the refrigerator or appointments with alarm, such as on a cell phone, can also help maximize recall.    To address problems with processing speed, she may wish to consider:   -  Ensuring that she is alerted when essential material or instructions are being presented   -Adjusting the speed at which new information is presented   -Allowing for more time in comprehending, processing, and responding in conversation  To address problems with fluctuating attention, she may wish to consider:   -Avoiding external distractions when needing to concentrate   -Limiting exposure to fast paced environments with multiple sensory demands   -Writing down complicated information and using checklists   -Attempting and completing one task at a time (i.e., no multi-tasking)   -Verbalizing aloud each step of a task to maintain focus   -Reducing the amount of information considered at one time

## 2019-09-23 NOTE — Progress Notes (Addendum)
   Neuropsychology Feedback Session [name]. [place] [facility] of Neurology  Reason for Referral:   [name] is a 49 y.o. right-handed Caucasian female referred by Ward Givens, NP,to characterize hercurrent cognitive functioning and assist with diagnostic clarity and treatment planning in the context of subjective cognitive decline, several psychiatric comorbidities, and a history of multiple concussions.   Feedback:   [name] completed a comprehensive neuropsychological evaluation on 09/16/2019. Please refer to that encounter for the full report and recommendations. Briefly, results suggested notable performance variability across numerous cognitive domains. Despite this, relative weaknesses were exhibited across processing speed, executive functioning, semantic fluency, and encoding/retrieval aspects of memory. The etiology for cognitive weaknesses is likely multifactorial in nature. Performance variability across cognitive testing, especially across domains of processing speed, executive functioning, and learning and memory would be expected with untreated ADHD and her performance is consistent with this presentation. It is also likely that ongoing mood related concerns, especially those surrounding anxiety, exacerbate an already dysregulated underlying attentional network. Bipolar disorder also is commonly associated with cognitive deficits in similar domains described above. Overall, the most likely culprit for ongoing cognitive weaknesses is her history of untreated ADHD combined with her history of bipolar disorder and acute mood-related stressors.  [name] was unaccompanied during the current telephone call. She was within her residence while I was within my office. Content of the current session focused on the results of her neuropsychological evaluation. [name] was given the opportunity to ask questions and her questions were answered. She was  encouraged to reach out should additional questions arise. Her report is available to her on MyChart.      16 minutes were spent conducting the current feedback session with [name], billed as one unit [id number].

## 2019-10-13 ENCOUNTER — Ambulatory Visit: Payer: PPO | Attending: Family Medicine | Admitting: Speech Pathology

## 2019-10-13 DIAGNOSIS — R41841 Cognitive communication deficit: Secondary | ICD-10-CM | POA: Insufficient documentation

## 2019-10-16 ENCOUNTER — Other Ambulatory Visit: Payer: Self-pay

## 2019-10-16 ENCOUNTER — Ambulatory Visit: Payer: PPO | Admitting: Speech Pathology

## 2019-10-16 DIAGNOSIS — R41841 Cognitive communication deficit: Secondary | ICD-10-CM | POA: Diagnosis not present

## 2019-10-16 NOTE — Patient Instructions (Signed)
Choose 1 project to focus on for the four days you are off :_______________________  Below, list the supplies you need and the steps you will need to do to complete project. Beside each step, estimate about how long that step will take you. (include time to go to the store and pick up supplies if you need them). Check off each step as you complete it.  Supplies Needed:     Steps to complete project:

## 2019-10-16 NOTE — Therapy (Signed)
[facility] [address] [place], [place], [address] Phone: [phone]   Fax:  [phone]  Speech Language Pathology Treatment  Patient Details  Name: [name] MRN: [id number] Date of Birth: 05-15-1970 Referring Provider (SLP): Trixie Deis NP   Encounter Date: 10/16/2019   End of Session - 10/16/19 1047    Visit Number 15    Number of Visits 26    Date for SLP Re-Evaluation 11/03/19    SLP Start Time 0808    SLP Stop Time  0844    SLP Time Calculation (min) 36 min    Activity Tolerance Patient tolerated treatment well           Past Medical History:  Diagnosis Date  . Achilles tendinitis   . ADHD (attention deficit hyperactivity disorder), inattentive type    Diagnosed as an adult after starting college  . Allergy   . Arthritis   . Bipolar I disorder 04/18/2014   most recent episode mixed  . Borderline personality disorder 10/27/2011  . Cataracts, bilateral   . Chronic pain disorder    due to several injuries affecting numerous areas of her body throughout the years  . 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 Eczema   . Episodic cluster headache, not intractable 12/02/2014  . Family history of genetic disease carrier   . Ganglion cyst 09/29/2009   left wrist (2 cyst)  . Generalized anxiety disorder 10/27/2011  . History of multiple concussions    October 2018 and September 2019  . Hyperprolactinemia   . Hypertension   . Insomnia 02/17/2015  . Lipoma   . 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 Migraine with status migrainosus 01/08/2013  . 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 Pes planus   . Photophobia of both eyes 05/04/2014    Past Surgical History:  Procedure Laterality Date  . ANKLE SURGERY  12/88   left    . chest nodule  1990?   rt chest wall nodule removal  . GANGLION CYST EXCISION  2011  . lipoma removal    . right bunioectomy    . SHOULDER SURGERY  01/13/2011   right, partial tear  . tumor resection left thigh      There were no vitals filed for this visit.   Subjective Assessment - 10/16/19 1039    Subjective "I had that test done"    Currently in Pain? No/denies                 ADULT SLP TREATMENT - 10/16/19 1039      General Information   Behavior/Cognition Alert;Cooperative;Distractible;Requires cueing      Treatment Provided   Treatment provided Cognitive-Linquistic      Pain Assessment   Pain Assessment No/denies pain      Cognitive-Linquistic Treatment   Treatment focused on Cognition;Patient/family/caregiver education    Skilled Treatment SLP discussed neuropscych findings with pt and how these might inform future goals for ST. Also reinforced Dr. Gustavus Bryant suggestion for pt to discuss ADHD medication with her PCP or psychiatrist. She continues to report frustration with processing, focus, losing/misplacing items, time management. SLP worked with pt to ID functional goals; see updated goals. We worked today on strategy of developing a planning checklist for home projects, which pt reports she has difficulty completing.  Pt to fill out checklist and use when choosing one project for her 4 days off this week.       Assessment / Recommendations / Plan   Plan Goals updated      Progression Toward Goals   Progression toward goals Progressing toward goals            SLP Education - 10/16/19 1047    Education Details discuss attention med with md    Person(s) Educated Patient    Methods Explanation    Comprehension Verbalized understanding              SLP Long Term Goals - 10/16/19 1050      SLP LONG TERM GOAL #1   Title Pt will utilize 2 compensatory strategies to recall store lists and daily to do's with rare min A over 3 sessions    Time 1     Period Weeks    Status Achieved      SLP LONG TERM GOAL #2   Title pt will use anomia compensations successfully in 15 minutes mod complex conversation over 3 therapy sessions    Time 5    Status On-going      SLP LONG TERM GOAL #3   Title Pt will utilize compensations for attention, processing and auditory comprehension when being given instructions, medical or insurance information at home and work with rare min A over 3 sessions    Time 1    Period Weeks    Status Achieved      SLP LONG TERM GOAL #4   Title Pt will verbalize anticipatory awareness before making safety decisions at work and household chores with rare min A over 3 sessions    Baseline trip planning; itinerary; monthly budget; [employer]    Time 5    Period Days    Status On-going      SLP LONG TERM GOAL #5   Title Pt will log activities for brain health over 3 sessions (exercise, mindfulness, cognitive activities, etc).    Time 6    Period Weeks    Status New      Additional Long Term Goals   Additional Long Term Goals Yes      SLP LONG TERM GOAL #6   Title Pt will report using strategies for attention to complete home projects over 3 session (breaks, single-tasking, using a checklist/preplanning).    Time 6    Period Weeks    Status New            Plan - 10/16/19 1047    Clinical Impression Statement [name] has carried over compensatory strategies of attention and memory at work and home. She continues to report difficulty carrying them over consistently and continues to have difficulty with word finding with inconsistent use of strategies. Neuropsych felt pt would benefit from ongoing ST, but also from addressing ADHD with her MD or psychiatrist, possibly resuming medication which she stopped after completing college. Goals updated.    Speech Therapy Frequency 2x / week    Duration --   12 weeks or 26 visits   Treatment/Interventions SLP instruction and feedback;Cueing hierarchy;Environmental  controls;Language facilitation;Compensatory techniques;Cognitive reorganization;Functional tasks;Compensatory strategies;Patient/family education;Multimodal communcation approach;Internal/external aids    Potential to Achieve Goals Good    Potential Considerations Co-morbidities   bipolar, ADHD          Patient will benefit from skilled therapeutic intervention in order to improve the following deficits and impairments:   Cognitive communication deficit  Problem List Patient Active Problem List   Diagnosis Date Noted  . 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 Family history of genetic disease carrier   . Family history of breast cancer   . Family history of colon cancer   . History of multiple concussions 11/20/2017  . 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 Insomnia 02/17/2015  . Right shoulder pain 12/31/2014  . Episodic cluster headache, not intractable 12/02/2014  . 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 Photophobia of both eyes 05/04/2014  . Nausea with vomiting 05/04/2014  . Bipolar I disorder, most recent episode mixed ([place]) 04/18/2014  . Suicidal ideation 04/12/2014  . Injury of right shoulder and upper arm 02/17/2014  . Migraine with status migrainosus 01/08/2013  . Chronic migraine 05/08/2012  . Contact dermatitis 11/27/2011  . Generalized anxiety disorder 10/27/2011  . 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 Borderline personality disorder ([place]) 10/27/2011  . Right foot pain 09/28/2011  . 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 Malignant tumor of muscle ([place]) 09/02/2010  . Ganglion cyst 09/29/2009  . Pes planus 07/01/2008   Deneise Lever, [place], CCC-SLP Speech-Language Pathologist  Aliene Altes 10/16/2019, 10:53 AM  [facility] [address] [place], [place], [address] Phone:  [phone]   Fax:  [phone]   Name: [name] MRN: [id number] Date of Birth: 1970-06-04

## 2019-10-22 ENCOUNTER — Ambulatory Visit: Payer: PPO | Admitting: Speech Pathology

## 2019-10-22 ENCOUNTER — Encounter: Payer: Self-pay | Admitting: Speech Pathology

## 2019-10-22 ENCOUNTER — Other Ambulatory Visit: Payer: Self-pay

## 2019-10-22 DIAGNOSIS — R41841 Cognitive communication deficit: Secondary | ICD-10-CM | POA: Diagnosis not present

## 2019-10-22 NOTE — Patient Instructions (Signed)
   Follow up with FNP re: ADHD meds  Bring in steps, if they are complete, cross them off  Make notes on if the steps listed were successful or not and if not, why it didn't work  I love that you are making drawings to figure out your steps

## 2019-10-22 NOTE — Therapy (Signed)
[facility] [address] [place], [place], [address] Phone: [phone]   Fax:  [phone]  Speech Language Pathology Treatment  Patient Details  Name: [name] MRN: [id number] Date of Birth: [birth year] Referring Provider (SLP): Trixie Deis NP   Encounter Date: 10/22/2019   End of Session - 10/22/19 1156    Visit Number 16    Number of Visits 26    Date for SLP Re-Evaluation 11/03/19    SLP Start Time 0847    SLP Stop Time  0930    SLP Time Calculation (min) 43 min           Past Medical History:  Diagnosis Date  . Achilles tendinitis   . ADHD (attention deficit hyperactivity disorder), inattentive type    Diagnosed as an adult after starting college  . Allergy   . Arthritis   . Bipolar I disorder 04/18/2014   most recent episode mixed  . Borderline personality disorder 10/27/2011  . Cataracts, bilateral   . Chronic pain disorder    due to several injuries affecting numerous areas of her body throughout the years  . 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 Eczema   . Episodic cluster headache, not intractable 12/02/2014  . Family history of genetic disease carrier   . Ganglion cyst 09/29/2009   left wrist (2 cyst)  . Generalized anxiety disorder 10/27/2011  . History of multiple concussions    October 2018 and September 2019  . Hyperprolactinemia   . Hypertension   . Insomnia 02/17/2015  . Lipoma   . 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 Migraine with status migrainosus 01/08/2013  . 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 Pes planus   . Photophobia of both eyes 05/04/2014    Past Surgical History:  Procedure Laterality Date  . ANKLE SURGERY  12/88   left   . chest nodule  1990?   rt chest wall nodule  removal  . GANGLION CYST EXCISION  2011  . lipoma removal    . right bunioectomy    . SHOULDER SURGERY  01/13/2011   right, partial tear  . tumor resection left thigh      There were no vitals filed for this visit.   Subjective Assessment - 10/22/19 0903    Subjective "I lost my notebook"    Currently in Pain? No/denies                 ADULT SLP TREATMENT - 10/22/19 0908      General Information   Behavior/Cognition Alert;Cooperative;Distractible;Requires cueing      Treatment Provided   Treatment provided Cognitive-Linquistic      Cognitive-Linquistic Treatment   Treatment focused on Cognition;Patient/family/caregiver education    Skilled Treatment Pt has called FNP re: ADHD meds and has not heard back. Today, focused on listing steps for tasks that she is working on. In writing down her steps, Pan realized she could be more efficient by combining steps (ie: paint everything at one time, rather than in between assembling fence) She required usual mod verbal and questioning cues to list steps to repair and rebuild table she is making. Rationale provided to not make mistakes and have to go back and remove parts (which is what happended when steps were not written out). Provided folder for  [name] to keep her lists/steps in and instructed her to bring this to the next session.      Assessment / Recommendations / Plan   Plan Continue with current plan of care      Progression Toward Goals   Progression toward goals Progressing toward goals            SLP Education - 10/22/19 1155    Education Details compensations for attention and organizations    Person(s) Educated Patient    Methods Explanation;Demonstration;Verbal cues;Handout    Comprehension Verbalized understanding;Returned demonstration;Verbal cues required;Need further instruction              SLP Long Term Goals - 10/22/19 1155      SLP LONG TERM GOAL #1   Title Pt will utilize 2 compensatory strategies  to recall store lists and daily to do's with rare min A over 3 sessions    Time 1    Period Weeks    Status Achieved      SLP LONG TERM GOAL #2   Title pt will use anomia compensations successfully in 15 minutes mod complex conversation over 3 therapy sessions    Time 5    Status On-going      SLP LONG TERM GOAL #3   Title Pt will utilize compensations for attention, processing and auditory comprehension when being given instructions, medical or insurance information at home and work with rare min A over 3 sessions    Time 1    Period Weeks    Status Achieved      SLP LONG TERM GOAL #4   Title Pt will verbalize anticipatory awareness before making safety decisions at work and household chores with rare min A over 3 sessions    Baseline trip planning; itinerary; monthly budget; [employer]    Time 5    Period Days    Status On-going      SLP LONG TERM GOAL #5   Title Pt will log activities for brain health over 3 sessions (exercise, mindfulness, cognitive activities, etc).    Time 6    Period Weeks    Status New      SLP LONG TERM GOAL #6   Title Pt will report using strategies for attention to complete home projects over 3 session (breaks, single-tasking, using a checklist/preplanning).    Time 6    Period Weeks    Status New            Plan - 10/22/19 0932    Clinical Impression Statement [name] has carried over compensatory strategies of attention and memory at work and home. She continues to report difficulty carrying them over consistently and continues to have difficulty with word finding with inconsistent use of strategies. Neuropsych felt pt would benefit from ongoing ST, but also from addressing ADHD with her MD or psychiatrist, possibly resuming medication which she stopped after completing college. Goals updated.    Speech Therapy Frequency 2x / week    Duration --   12 weeks or 26 visits   Treatment/Interventions SLP instruction and feedback;Cueing  hierarchy;Environmental controls;Language facilitation;Compensatory techniques;Cognitive reorganization;Functional tasks;Compensatory strategies;Patient/family education;Multimodal communcation approach;Internal/external aids    Potential to Achieve Goals Good           Patient will benefit from skilled therapeutic intervention in order to improve the following deficits and impairments:   Cognitive communication deficit    Problem List Patient Active Problem List   Diagnosis Date Noted  . Monoallelic mutation  of CHEK2 gene in female patient 02/27/2018  . Family history of genetic disease carrier   . Family history of breast cancer   . Family history of colon cancer   . History of multiple concussions 11/20/2017  . 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 Insomnia 02/17/2015  . Right shoulder pain 12/31/2014  . Episodic cluster headache, not intractable 12/02/2014  . 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 Photophobia of both eyes 05/04/2014  . Nausea with vomiting 05/04/2014  . Bipolar I disorder, most recent episode mixed ([place]) 04/18/2014  . Suicidal ideation 04/12/2014  . Injury of right shoulder and upper arm 02/17/2014  . Migraine with status migrainosus 01/08/2013  . Chronic migraine 05/08/2012  . Contact dermatitis 11/27/2011  . Generalized anxiety disorder 10/27/2011  . 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 Borderline personality disorder ([place]) 10/27/2011  . Right foot pain 09/28/2011  . 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 Malignant tumor of muscle ([place]) 09/02/2010  . Ganglion cyst 09/29/2009  . Pes planus 07/01/2008    Petr Bontempo, Annye Rusk MS, CCC-SLP 10/22/2019, 11:57 AM  [facility] [address] [place], [place], [address] Phone: [phone]   Fax:  [phone]   Name: KYRIN GARN MRN:  [id number] Date of Birth: 1970/02/08

## 2019-10-23 ENCOUNTER — Ambulatory Visit: Payer: PPO | Admitting: Speech Pathology

## 2019-10-23 DIAGNOSIS — R41841 Cognitive communication deficit: Secondary | ICD-10-CM

## 2019-10-23 NOTE — Therapy (Signed)
[facility] [address] [place], [place], [address] Phone: [phone]   Fax:  [phone]  Speech Language Pathology Treatment  Patient Details  Name: [name] MRN: [id number] Date of Birth: 07-24-1970 Referring Provider (SLP): Trixie Deis NP   Encounter Date: 10/23/2019   End of Session - 10/23/19 1414    Visit Number 17    Number of Visits 26    Date for SLP Re-Evaluation 11/03/19    SLP Start Time 0848    SLP Stop Time  0930    SLP Time Calculation (min) 42 min    Activity Tolerance Patient tolerated treatment well           Past Medical History:  Diagnosis Date  . Achilles tendinitis   . ADHD (attention deficit hyperactivity disorder), inattentive type    Diagnosed as an adult after starting college  . Allergy   . Arthritis   . Bipolar I disorder 04/18/2014   most recent episode mixed  . Borderline personality disorder 10/27/2011  . Cataracts, bilateral   . Chronic pain disorder    due to several injuries affecting numerous areas of her body throughout the years  . 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 Eczema   . Episodic cluster headache, not intractable 12/02/2014  . Family history of genetic disease carrier   . Ganglion cyst 09/29/2009   left wrist (2 cyst)  . Generalized anxiety disorder 10/27/2011  . History of multiple concussions    October 2018 and September 2019  . Hyperprolactinemia   . Hypertension   . Insomnia 02/17/2015  . Lipoma   . 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 Migraine with status migrainosus 01/08/2013  . 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 Pes planus   . Photophobia of both eyes 05/04/2014    Past Surgical History:  Procedure Laterality Date  . ANKLE SURGERY  12/88   left    . chest nodule  1990?   rt chest wall nodule removal  . GANGLION CYST EXCISION  2011  . lipoma removal    . right bunioectomy    . SHOULDER SURGERY  01/13/2011   right, partial tear  . tumor resection left thigh      There were no vitals filed for this visit.   Subjective Assessment - 10/23/19 0850    Subjective "I did call my family nurse practitioner."    Currently in Pain? No/denies                 ADULT SLP TREATMENT - 10/23/19 0850      General Information   Behavior/Cognition Alert;Cooperative;Distractible;Requires cueing      Treatment Provided   Treatment provided Cognitive-Linquistic      Pain Assessment   Pain Assessment No/denies pain      Cognitive-Linquistic Treatment   Treatment focused on Cognition;Patient/family/caregiver education    Skilled Treatment [name] called her FNP again to follow up on ADHD meds; has not heard back. Pt showed SLP list of steps for her fence project, mentioned needing to obtain additional supplies at the store; she had not made a list for this. Pt wrote list with occasional min question cues; while writing the list she ID'd other supplies she had not considered (sandpaper, screws). Continues to require redirection for topic maintenance; she  had brief hesitation today for wordfinding but used a replacement word without cues. SLP praised pt for this. She continues to report frustration when she can't think of a word. When SLP asked pt what she could do about this, she required mod-max cues to state anomia compensations. End of session we discussed activities pt can do for "brain health" and generated a tracker sheet for pt to log these activities.      Assessment / Recommendations / Plan   Plan Continue with current plan of care      Progression Toward Goals   Progression toward goals Progressing toward goals            SLP Education - 10/23/19 1413    Education Details anomia compensations    Person(s) Educated Patient     Methods Explanation    Comprehension Verbalized understanding              SLP Long Term Goals - 10/23/19 0907      SLP LONG TERM GOAL #1   Title Pt will utilize 2 compensatory strategies to recall store lists and daily to do's with rare min A over 3 sessions    Time 1    Period Weeks    Status Achieved      SLP LONG TERM GOAL #2   Title pt will use anomia compensations successfully in 15 minutes mod complex conversation over 3 therapy sessions    Time 5    Status On-going      SLP LONG TERM GOAL #3   Title Pt will utilize compensations for attention, processing and auditory comprehension when being given instructions, medical or insurance information at home and work with rare min A over 3 sessions    Time 1    Period Weeks    Status Achieved      SLP LONG TERM GOAL #4   Title Pt will verbalize anticipatory awareness before making safety decisions at work and household chores with rare min A over 3 sessions    Baseline trip planning; itinerary; monthly budget; [employer]    Time 5    Period Days    Status On-going      SLP LONG TERM GOAL #5   Title Pt will log activities for brain health over 3 sessions (exercise, mindfulness, cognitive activities, etc).    Time 6    Period Weeks    Status On-going      SLP LONG TERM GOAL #6   Title Pt will report using strategies for attention to complete home projects over 3 session (breaks, single-tasking, using a checklist/preplanning).    Time 6    Period Weeks    Status On-going            Plan - 10/23/19 1414    Clinical Impression Statement [name] has carried over compensatory strategies of attention and memory at work and home. She continues to report difficulty carrying them over consistently and continues to have difficulty with word finding with inconsistent use of strategies. Neuropsych felt pt would benefit from ongoing ST, but also from addressing ADHD with her MD or psychiatrist, possibly resuming medication which she  stopped after completing college.    Speech Therapy Frequency 2x / week    Duration --   12 weeks or 26 visits   Treatment/Interventions SLP instruction and feedback;Cueing hierarchy;Environmental controls;Language facilitation;Compensatory techniques;Cognitive reorganization;Functional tasks;Compensatory strategies;Patient/family education;Multimodal communcation approach;Internal/external aids    Potential to Achieve Goals Good  Patient will benefit from skilled therapeutic intervention in order to improve the following deficits and impairments:   Cognitive communication deficit    Problem List Patient Active Problem List   Diagnosis Date Noted  . 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 Family history of genetic disease carrier   . Family history of breast cancer   . Family history of colon cancer   . History of multiple concussions 11/20/2017  . 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 Insomnia 02/17/2015  . Right shoulder pain 12/31/2014  . Episodic cluster headache, not intractable 12/02/2014  . 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 Photophobia of both eyes 05/04/2014  . Nausea with vomiting 05/04/2014  . Bipolar I disorder, most recent episode mixed ([place]) 04/18/2014  . Suicidal ideation 04/12/2014  . Injury of right shoulder and upper arm 02/17/2014  . Migraine with status migrainosus 01/08/2013  . Chronic migraine 05/08/2012  . Contact dermatitis 11/27/2011  . Generalized anxiety disorder 10/27/2011  . 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 Borderline personality disorder ([place]) 10/27/2011  . Right foot pain 09/28/2011  . 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 Malignant tumor of muscle ([place]) 09/02/2010  . Ganglion cyst 09/29/2009  . Pes planus 07/01/2008   Deneise Lever, [place], CCC-SLP Speech-Language Pathologist  Aliene Altes 10/23/2019, 2:15 PM  [facility] [address] [place], [place], [address] Phone: [phone]   Fax:  [phone]   Name: HILDEGARDE DUNAWAY MRN: [id number] Date of Birth: 04/27/1970

## 2019-10-28 ENCOUNTER — Other Ambulatory Visit: Payer: Self-pay

## 2019-10-28 ENCOUNTER — Ambulatory Visit: Payer: PPO | Admitting: Speech Pathology

## 2019-10-28 DIAGNOSIS — R41841 Cognitive communication deficit: Secondary | ICD-10-CM

## 2019-10-28 NOTE — Patient Instructions (Signed)
Follow up with your NP before Thursday.  Use your list for the fence project.  If you complete the fence project, MAKE A LIST for the next project you choose (supplies, steps, etc) BEFORE you start it.   Bring your binder and any lists you make with you next time.

## 2019-10-29 NOTE — Therapy (Signed)
[facility] [address] [place], [place], [address] Phone: [phone]   Fax:  [phone]  Speech Language Pathology Treatment  Patient Details  Name: [name] MRN: [id number] Date of Birth: 1970-07-20 Referring Provider (SLP): Trixie Deis NP   Encounter Date: 10/28/2019   End of Session - 10/29/19 0854    Visit Number 18    Number of Visits 26    Date for SLP Re-Evaluation 11/03/19    SLP Start Time 0849    SLP Stop Time  0928    SLP Time Calculation (min) 39 min    Activity Tolerance Patient tolerated treatment well           Past Medical History:  Diagnosis Date  . Achilles tendinitis   . ADHD (attention deficit hyperactivity disorder), inattentive type    Diagnosed as an adult after starting college  . Allergy   . Arthritis   . Bipolar I disorder 04/18/2014   most recent episode mixed  . Borderline personality disorder 10/27/2011  . Cataracts, bilateral   . Chronic pain disorder    due to several injuries affecting numerous areas of her body throughout the years  . 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 Eczema   . Episodic cluster headache, not intractable 12/02/2014  . Family history of genetic disease carrier   . Ganglion cyst 09/29/2009   left wrist (2 cyst)  . Generalized anxiety disorder 10/27/2011  . History of multiple concussions    October 2018 and September 2019  . Hyperprolactinemia   . Hypertension   . Insomnia 02/17/2015  . Lipoma   . 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 Migraine with status migrainosus 01/08/2013  . 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 Pes planus   . Photophobia of both eyes 05/04/2014    Past Surgical History:  Procedure Laterality Date  . ANKLE SURGERY  12/88   left    . chest nodule  1990?   rt chest wall nodule removal  . GANGLION CYST EXCISION  2011  . lipoma removal    . right bunioectomy    . SHOULDER SURGERY  01/13/2011   right, partial tear  . tumor resection left thigh      There were no vitals filed for this visit.   Subjective Assessment - 10/28/19 0852    Subjective "I have been getting my exercise."    Currently in Pain? No/denies                 ADULT SLP TREATMENT - 10/29/19 0848      General Information   Behavior/Cognition Alert;Cooperative;Distractible;Requires cueing      Treatment Provided   Treatment provided Cognitive-Linquistic      Pain Assessment   Pain Assessment No/denies pain      Cognitive-Linquistic Treatment   Treatment focused on Cognition;Patient/family/caregiver education    Skilled Treatment Pt has not heard back from FNP about attention med; she has not called since last week. Encouraged her to follow up about this. Pt reported her brother did not want her to use power saw; SLP told pt concerns about this due to her attention and also advised against this. She did not have her notebook with her today (left in car). Reports she has been using tracker sheet for brain health  activities; she is to bring this to next session. SLP discussed goals with pt and that potentially, if we are not seeing significant progress toward goals, it may be appropriate to pause therapy and wait to see if an attention med would benefit her. She did not complete her fence task; when SLP asked about how her checklist went, pt talked about a table and a new project she is planning to start with pallets.  Pt homework is to follow up with her FNP, and use her checklist for her fence project before starting new project.      Assessment / Recommendations / Plan   Plan Continue with current plan of care      Progression Toward Goals   Progression toward goals Not progressing toward goals (comment)   consider hold or d/c if no  progress               SLP Long Term Goals - 10/28/19 0918      SLP LONG TERM GOAL #1   Title Pt will utilize 2 compensatory strategies to recall store lists and daily to do's with rare min A over 3 sessions    Time 1    Period Weeks    Status Achieved      SLP LONG TERM GOAL #2   Title pt will use anomia compensations successfully in 15 minutes mod complex conversation over 3 therapy sessions    Time 4    Status On-going      SLP LONG TERM GOAL #3   Title Pt will utilize compensations for attention, processing and auditory comprehension when being given instructions, medical or insurance information at home and work with rare min A over 3 sessions    Time 1    Period Weeks    Status Achieved      SLP LONG TERM GOAL #4   Title Pt will verbalize anticipatory awareness before making safety decisions at work and household chores with rare min A over 3 sessions    Baseline trip planning; itinerary; monthly budget; [employer]    Time 4    Period Days    Status On-going      SLP LONG TERM GOAL #5   Title Pt will log activities for brain health over 3 sessions (exercise, mindfulness, cognitive activities, etc).    Time 4    Period Weeks    Status On-going      SLP LONG TERM GOAL #6   Title Pt will report using strategies for attention to complete home projects over 3 session (breaks, single-tasking, using a checklist/preplanning).    Time 4    Period Weeks    Status On-going            Plan - 10/29/19 0855    Clinical Impression Statement [name] has carried over compensatory strategies of attention and memory at work and home. She continues to report difficulty carrying them over consistently and continues to have difficulty with word finding with inconsistent use of strategies; this appears the same in recent sessions. Neuropsych felt pt would benefit from ongoing ST, but also from addressing ADHD with her MD or psychiatrist, possibly resuming medication which she stopped  after completing college. If no significant gains noted, will likely pause therapy or d/c with consideration to resume once underlying ADHD is addressed to determine if pt may benefit from further ST.    Speech Therapy Frequency 2x / week    Duration --   12 weeks  or 26 visits   Treatment/Interventions SLP instruction and feedback;Cueing hierarchy;Environmental controls;Language facilitation;Compensatory techniques;Cognitive reorganization;Functional tasks;Compensatory strategies;Patient/family education;Multimodal communcation approach;Internal/external aids    Potential to Achieve Goals Good           Patient will benefit from skilled therapeutic intervention in order to improve the following deficits and impairments:   Cognitive communication deficit    Problem List Patient Active Problem List   Diagnosis Date Noted  . 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 Family history of genetic disease carrier   . Family history of breast cancer   . Family history of colon cancer   . History of multiple concussions 11/20/2017  . 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 Insomnia 02/17/2015  . Right shoulder pain 12/31/2014  . Episodic cluster headache, not intractable 12/02/2014  . 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 Photophobia of both eyes 05/04/2014  . Nausea with vomiting 05/04/2014  . Bipolar I disorder, most recent episode mixed ([place]) 04/18/2014  . Suicidal ideation 04/12/2014  . Injury of right shoulder and upper arm 02/17/2014  . Migraine with status migrainosus 01/08/2013  . Chronic migraine 05/08/2012  . Contact dermatitis 11/27/2011  . Generalized anxiety disorder 10/27/2011  . 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 Borderline personality disorder ([place]) 10/27/2011  . Right foot pain 09/28/2011  . 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 Malignant tumor of  muscle ([place]) 09/02/2010  . Ganglion cyst 09/29/2009  . Pes planus 07/01/2008   Deneise Lever, [place], CCC-SLP Speech-Language Pathologist  Aliene Altes 10/29/2019, 8:59 AM  [facility] [address] [place], [place], [address] Phone: [phone]   Fax:  [phone]   Name: DIARRA CEJA MRN: [id number] Date of Birth: [birth year]

## 2019-10-30 ENCOUNTER — Ambulatory Visit: Payer: PPO | Admitting: Speech Pathology

## 2019-10-30 ENCOUNTER — Other Ambulatory Visit: Payer: Self-pay

## 2019-10-30 DIAGNOSIS — R41841 Cognitive communication deficit: Secondary | ICD-10-CM

## 2019-10-30 NOTE — Therapy (Signed)
[facility] [address] [place], [place], [address] Phone: [phone]   Fax:  [phone]  Speech Language Pathology Treatment  Patient Details  Name: [name] MRN: [id number] Date of Birth: [birth year] Referring Provider (SLP): Trixie Deis NP   Encounter Date: 10/30/2019   End of Session - 10/30/19 0850    Visit Number 19    Number of Visits 26    Date for SLP Re-Evaluation 11/03/19    SLP Start Time 0848    SLP Stop Time  0928    SLP Time Calculation (min) 40 min    Activity Tolerance Patient tolerated treatment well           Past Medical History:  Diagnosis Date  . Achilles tendinitis   . ADHD (attention deficit hyperactivity disorder), inattentive type    Diagnosed as an adult after starting college  . Allergy   . Arthritis   . Bipolar I disorder 04/18/2014   most recent episode mixed  . Borderline personality disorder 10/27/2011  . Cataracts, bilateral   . Chronic pain disorder    due to several injuries affecting numerous areas of her body throughout the years  . 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 Eczema   . Episodic cluster headache, not intractable 12/02/2014  . Family history of genetic disease carrier   . Ganglion cyst 09/29/2009   left wrist (2 cyst)  . Generalized anxiety disorder 10/27/2011  . History of multiple concussions    October 2018 and September 2019  . Hyperprolactinemia   . Hypertension   . Insomnia 02/17/2015  . Lipoma   . 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 Migraine with status migrainosus 01/08/2013  . 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 Pes planus   . Photophobia of both eyes 05/04/2014    Past Surgical History:  Procedure Laterality Date  . ANKLE SURGERY  12/88   left    . chest nodule  1990?   rt chest wall nodule removal  . GANGLION CYST EXCISION  2011  . lipoma removal    . right bunioectomy    . SHOULDER SURGERY  01/13/2011   right, partial tear  . tumor resection left thigh      There were no vitals filed for this visit.   Subjective Assessment - 10/30/19 0850    Subjective Arrives with binder.    Currently in Pain? No/denies                 ADULT SLP TREATMENT - 10/30/19 0929      General Information   Behavior/Cognition Alert;Cooperative;Pleasant mood      Treatment Provided   Treatment provided Cognitive-Linquistic      Pain Assessment   Pain Assessment No/denies pain      Cognitive-Linquistic Treatment   Treatment focused on Cognition;Patient/family/caregiver education    Skilled Treatment Pt put fence project "on hold", has been helping mom clean out storage room. In her binder she preplanned/work task list for breaking down pallets. She also took notes re: phone exchange with her FNP about appointment. Using habit tracker to log habbits for brain health. Independently initiated request for sheet for next month. No compensations for anomia necessary this session, pt reports this is because she is "comfortable" but has more difficulty at work or  under time pressure. We reviewed compensations and importance of slowing down. Revised goal for anomia compensations for pt to use these outside of session. See pt instructions for more details.      Assessment / Recommendations / Plan   Plan Goals updated;Continue with current plan of care      Progression Toward Goals   Progression toward goals Progressing toward goals                SLP Long Term Goals - 10/30/19 0851      SLP LONG TERM GOAL #1   Title Pt will utilize 2 compensatory strategies to recall store lists and daily to do's with rare min A over 3 sessions    Time 1    Period Weeks    Status Achieved      SLP LONG TERM GOAL #2   Title pt report will using  anomia compensations outside of ST room x2 sessions.    Time 4    Status Revised      SLP LONG TERM GOAL #3   Title Pt will utilize compensations for attention, processing and auditory comprehension when being given instructions, medical or insurance information at home and work with rare min A over 3 sessions    Time 1    Period Weeks    Status Achieved      SLP LONG TERM GOAL #4   Title Pt will verbalize anticipatory awareness before making safety decisions at work and household chores with rare min A over 3 sessions    Baseline 10/30/19    Time 4    Period Days    Status On-going      SLP LONG TERM GOAL #5   Title Pt will log activities for brain health over 3 sessions (exercise, mindfulness, cognitive activities, etc).    Baseline 10/30/19    Time 4    Period Weeks    Status On-going      SLP LONG TERM GOAL #6   Title Pt will report using strategies for attention to complete home projects over 3 session (breaks, single-tasking, using a checklist/preplanning).    Baseline 10/30/19    Time 4    Period Weeks    Status On-going            Plan - 10/30/19 0929    Clinical Impression Statement [name] has carried over compensatory strategies of attention and memory at work and home. She continues to report difficulty carrying them over consistently, however she has been using notetaking and preplanning since last session to help with recall and attention. Neuropsych felt pt would benefit from ongoing ST, but also from addressing ADHD with her MD or psychiatrist, possibly resuming medication which she stopped after completing college. Pt is awaiting an appointment with her psychiatrist to discuss this. Patient is progressing toward goals and will benefit from ongoing ST; anticipate d/c in next 2 visits.    Speech Therapy Frequency 2x / week    Duration --   12 weeks or 26 visits   Treatment/Interventions SLP instruction and feedback;Cueing hierarchy;Environmental controls;Language  facilitation;Compensatory techniques;Cognitive reorganization;Functional tasks;Compensatory strategies;Patient/family education;Multimodal communcation approach;Internal/external aids    Potential to Achieve Goals Good           Patient will benefit from skilled therapeutic intervention in order to improve the following deficits and impairments:   Cognitive communication deficit    Problem List Patient Active Problem List   Diagnosis Date Noted  . Monoallelic mutation of CHEK2 gene  in female patient 02/27/2018  . Family history of genetic disease carrier   . Family history of breast cancer   . Family history of colon cancer   . History of multiple concussions 11/20/2017  . 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 Insomnia 02/17/2015  . Right shoulder pain 12/31/2014  . Episodic cluster headache, not intractable 12/02/2014  . 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 Photophobia of both eyes 05/04/2014  . Nausea with vomiting 05/04/2014  . Bipolar I disorder, most recent episode mixed ([place]) 04/18/2014  . Suicidal ideation 04/12/2014  . Injury of right shoulder and upper arm 02/17/2014  . Migraine with status migrainosus 01/08/2013  . Chronic migraine 05/08/2012  . Contact dermatitis 11/27/2011  . Generalized anxiety disorder 10/27/2011  . 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 Borderline personality disorder ([place]) 10/27/2011  . Right foot pain 09/28/2011  . 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 Malignant tumor of muscle ([place]) 09/02/2010  . Ganglion cyst 09/29/2009  . Pes planus 07/01/2008   Deneise Lever, [place], CCC-SLP Speech-Language Pathologist  Aliene Altes 10/30/2019, 9:32 AM  [facility] [address] [place], [place], [address] Phone: [phone]   Fax:  [phone]   Name: TRINTY MARKEN MRN:  [id number] Date of Birth: 24-Sep-1970

## 2019-10-30 NOTE — Patient Instructions (Signed)
When you're stumbling over words, remember: STOP, slow down, take a breath. Use a different word or try to describe.  Great job remembering to take notes about phone calls to help you remember to follow up: Keep doing this!  Keep pre-planning your projects. Before you start, write down the supplies you need and think through the steps you will take.   Keep logging your good habits! If you want to remember to journal, try setting an alarm for yourself at a certain time of day when you know you'll be at home and able to focus.

## 2019-11-03 ENCOUNTER — Ambulatory Visit: Payer: PPO | Attending: Family Medicine | Admitting: Speech Pathology

## 2019-11-03 DIAGNOSIS — R41841 Cognitive communication deficit: Secondary | ICD-10-CM | POA: Insufficient documentation

## 2019-11-04 ENCOUNTER — Ambulatory Visit: Payer: PPO | Admitting: Speech Pathology

## 2019-11-04 ENCOUNTER — Other Ambulatory Visit: Payer: Self-pay

## 2019-11-04 DIAGNOSIS — R41841 Cognitive communication deficit: Secondary | ICD-10-CM | POA: Diagnosis not present

## 2019-11-04 NOTE — Therapy (Signed)
[facility] [address] [place], [place], [address] Phone: [phone]   Fax:  [phone]  Speech Language Pathology Treatment  Patient Details  Name: [name] MRN: [id number] Date of Birth: April 22, 1970 Referring Provider (SLP): Trixie Deis NP   Encounter Date: 11/04/2019   End of Session - 11/04/19 0959    Visit Number 20    Number of Visits 26    Date for SLP Re-Evaluation 11/03/19    SLP Start Time 0933    SLP Stop Time  1013    SLP Time Calculation (min) 40 min    Activity Tolerance Patient tolerated treatment well           Past Medical History:  Diagnosis Date  . Achilles tendinitis   . ADHD (attention deficit hyperactivity disorder), inattentive type    Diagnosed as an adult after starting college  . Allergy   . Arthritis   . Bipolar I disorder 04/18/2014   most recent episode mixed  . Borderline personality disorder 10/27/2011  . Cataracts, bilateral   . Chronic pain disorder    due to several injuries affecting numerous areas of her body throughout the years  . 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 Eczema   . Episodic cluster headache, not intractable 12/02/2014  . Family history of genetic disease carrier   . Ganglion cyst 09/29/2009   left wrist (2 cyst)  . Generalized anxiety disorder 10/27/2011  . History of multiple concussions    October 2018 and September 2019  . Hyperprolactinemia   . Hypertension   . Insomnia 02/17/2015  . Lipoma   . 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 Migraine with status migrainosus 01/08/2013  . 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 Pes planus   . Photophobia of both eyes 05/04/2014    Past Surgical History:  Procedure Laterality Date  . ANKLE SURGERY  12/88   left    . chest nodule  1990?   rt chest wall nodule removal  . GANGLION CYST EXCISION  2011  . lipoma removal    . right bunioectomy    . SHOULDER SURGERY  01/13/2011   right, partial tear  . tumor resection left thigh      There were no vitals filed for this visit.   Subjective Assessment - 11/04/19 0934    Subjective "I got an appointment at 2:00"    Currently in Pain? No/denies                 ADULT SLP TREATMENT - 11/04/19 0940      General Information   Behavior/Cognition Alert;Cooperative;Pleasant mood      Treatment Provided   Treatment provided Cognitive-Linquistic      Pain Assessment   Pain Assessment No/denies pain      Cognitive-Linquistic Treatment   Treatment focused on Cognition;Patient/family/caregiver education    Skilled Treatment Patient lost track of time and missed appointment yesterday. Reports she got a call this morning about an earlier appointment with her FNP, tomorrow. Recalled time, stated she wrote this in her notebook. We discussed that a phone alarm may also be helpful for recall of this appointment; pt set alarm in her phone with rare min A. Pt required cues to pre-plan for her appointment by generating list of questions/concerns; min cues  to ask questions about what type of medication might be helpful for her and any potential side effects. Also cues to write down the symptoms she is experiencing to share with FNP (difficulty concentrating, etc). Did not bring her notebook today but reports using tracker sheet for healthy brain habits.       Assessment / Recommendations / Plan   Plan Continue with current plan of care      Progression Toward Goals   Progression toward goals Progressing toward goals            SLP Education - 11/04/19 0958    Education Details Pre-plan for appointments by making a list of what she wants to discuss with provider; take notes during appointment    Person(s) Educated Patient    Methods Explanation;Verbal  cues    Comprehension Verbalized understanding;Verbal cues required;Need further instruction              SLP Long Term Goals - 11/04/19 1008      SLP LONG TERM GOAL #1   Title Pt will utilize 2 compensatory strategies to recall store lists and daily to do's with rare min A over 3 sessions    Time 1    Period Weeks    Status Achieved      SLP LONG TERM GOAL #2   Title pt report will using anomia compensations outside of ST room x2 sessions.    Time 4    Status Revised      SLP LONG TERM GOAL #3   Title Pt will utilize compensations for attention, processing and auditory comprehension when being given instructions, medical or insurance information at home and work with rare min A over 3 sessions    Time 1    Period Weeks    Status Achieved      SLP LONG TERM GOAL #4   Title Pt will verbalize anticipatory awareness before making safety decisions at work and household chores with rare min A over 3 sessions    Baseline 10/30/19    Time 3    Period Days    Status On-going      SLP LONG TERM GOAL #5   Title Pt will log activities for brain health over 3 sessions (exercise, mindfulness, cognitive activities, etc).    Baseline 10/30/19 11/04/19    Time 3    Period Weeks    Status On-going      SLP LONG TERM GOAL #6   Title Pt will report using strategies for attention to complete home projects over 3 session (breaks, single-tasking, using a checklist/preplanning).    Baseline 10/30/19    Time 3    Period Weeks    Status On-going            Plan - 11/04/19 1000    Clinical Impression Statement [name] has carried over compensatory strategies of attention and memory at work and home. She continues to report difficulty carrying them over consistently, however she has been using notetaking and preplanning since last session to help with recall and attention. Neuropsych felt pt would benefit from ongoing ST, but also from addressing ADHD with her MD or psychiatrist, possibly resuming  medication which she stopped after completing college. Pt is awaiting an appointment with her psychiatrist to discuss this. Patient is progressing toward goals and will benefit from ongoing ST; anticipate d/c next visit and pt is in agreement with this plan.    Speech Therapy Frequency 2x / week  Duration --   12 weeks or 26 visits   Treatment/Interventions SLP instruction and feedback;Cueing hierarchy;Environmental controls;Language facilitation;Compensatory techniques;Cognitive reorganization;Functional tasks;Compensatory strategies;Patient/family education;Multimodal communcation approach;Internal/external aids    Potential to Achieve Goals Good           Patient will benefit from skilled therapeutic intervention in order to improve the following deficits and impairments:   Cognitive communication deficit    Problem List Patient Active Problem List   Diagnosis Date Noted  . 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 Family history of genetic disease carrier   . Family history of breast cancer   . Family history of colon cancer   . History of multiple concussions 11/20/2017  . 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 Insomnia 02/17/2015  . Right shoulder pain 12/31/2014  . Episodic cluster headache, not intractable 12/02/2014  . 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 Photophobia of both eyes 05/04/2014  . Nausea with vomiting 05/04/2014  . Bipolar I disorder, most recent episode mixed ([place]) 04/18/2014  . Suicidal ideation 04/12/2014  . Injury of right shoulder and upper arm 02/17/2014  . Migraine with status migrainosus 01/08/2013  . Chronic migraine 05/08/2012  . Contact dermatitis 11/27/2011  . Generalized anxiety disorder 10/27/2011  . 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 Borderline personality disorder ([place]) 10/27/2011  . Right foot pain 09/28/2011  . 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 Malignant tumor of muscle ([place]) 09/02/2010  . Ganglion cyst 09/29/2009  . Pes planus 07/01/2008   Deneise Lever, [place], CCC-SLP Speech-Language Pathologist  Aliene Altes 11/04/2019, 10:14 AM  [facility] [address] [place], [place], [address] Phone: [phone]   Fax:  [phone]   Name: ASRA GAMBREL MRN: [id number] Date of Birth: 09-09-1970

## 2019-11-04 NOTE — Patient Instructions (Addendum)
If you're having trouble remembering to bring things with you when leave the house, consider hanging a bag by the door or on the door handle, where you can assemble items you need to take with you when you leave, like your notebook.   Look at downloading the [facility] app for your phone. This lets you look at appointments and add them to your phone calendar quickly just with push of a button.  Find your notebook and bring it with you tomorrow.  Use your list of questions when you see [name] to make sure you address all your concerns in your appointment. Take some notes to help you remember what she tells you and any follow-up steps, instructions about medications.  Completing what you start: Remember to set reasonable goals for yourself. Think about starting with a small project that's easy to complete in one sitting, and then gradually work yourself up to planning a project over a couple of days.

## 2019-11-05 ENCOUNTER — Ambulatory Visit: Payer: PPO | Admitting: Speech Pathology

## 2019-11-05 ENCOUNTER — Encounter: Payer: Self-pay | Admitting: Speech Pathology

## 2019-11-05 DIAGNOSIS — R41841 Cognitive communication deficit: Secondary | ICD-10-CM | POA: Diagnosis not present

## 2019-11-05 NOTE — Therapy (Signed)
[facility] [address] [place], [place], [address] Phone: [phone]   Fax:  [phone]  Speech Language Pathology Treatment & Discharge Summary  Patient Details  Name: [name] MRN: [id number] Date of Birth: 1970/12/12 Referring Provider (SLP): Trixie Deis NP   Encounter Date: 11/05/2019   End of Session - 11/05/19 1538    Visit Number 21    Number of Visits 26    Date for SLP Re-Evaluation 11/03/19    SLP Start Time 6962    SLP Stop Time  1445    SLP Time Calculation (min) 42 min    Activity Tolerance Patient tolerated treatment well           Past Medical History:  Diagnosis Date  . Achilles tendinitis   . ADHD (attention deficit hyperactivity disorder), inattentive type    Diagnosed as an adult after starting college  . Allergy   . Arthritis   . Bipolar I disorder 04/18/2014   most recent episode mixed  . Borderline personality disorder 10/27/2011  . Cataracts, bilateral   . Chronic pain disorder    due to several injuries affecting numerous areas of her body throughout the years  . 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 Eczema   . Episodic cluster headache, not intractable 12/02/2014  . Family history of genetic disease carrier   . Ganglion cyst 09/29/2009   left wrist (2 cyst)  . Generalized anxiety disorder 10/27/2011  . History of multiple concussions    October 2018 and September 2019  . Hyperprolactinemia   . Hypertension   . Insomnia 02/17/2015  . Lipoma   . 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 Migraine with status migrainosus 01/08/2013  . 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 Pes planus   . Photophobia of both eyes 05/04/2014    Past Surgical History:  Procedure Laterality Date  . ANKLE  SURGERY  12/88   left   . chest nodule  1990?   rt chest wall nodule removal  . GANGLION CYST EXCISION  2011  . lipoma removal    . right bunioectomy    . SHOULDER SURGERY  01/13/2011   right, partial tear  . tumor resection left thigh      There were no vitals filed for this visit.   Subjective Assessment - 11/05/19 1458    Subjective "I go on Adderal tomorrow morning it 's the EQ"    Currently in Pain? No/denies                 ADULT SLP TREATMENT - 11/05/19 1500      General Information   Behavior/Cognition Alert;Cooperative;Pleasant mood      Treatment Provided   Treatment provided Cognitive-Linquistic      Pain Assessment   Pain Assessment No/denies pain      Cognitive-Linquistic Treatment   Treatment focused on Cognition;Patient/family/caregiver education    Skilled Treatment Pt enters with notebook - she is using Habit Tracker to keep track of brain acitivies. She is using notebook to help complete steps in tasks.  She is taking breaks in between and avoiding multitasking      Assessment / Recommendations / Plan   Plan Discharge SLP treatment due to (comment)   goals met     Progression Toward Goals  Progression toward goals Goals met, education completed, patient discharged from [facility]  Visits from Start of Care: 21  Current functional level related to goals / functional outcomes: See goals below   Remaining deficits: ADHD, cognitive   Education / Equipment: Compensations for attention, organization, executive function Plan: Patient agrees to discharge.  Patient goals were met. Patient is being discharged due to meeting the stated rehab goals.  ?????         SLP Long Term Goals - 11/05/19 1536      SLP LONG TERM GOAL #1   Title Pt will utilize 2 compensatory strategies to recall store lists and daily to do's with rare min A over 3 sessions    Time 1    Period Weeks    Status Achieved       SLP LONG TERM GOAL #2   Title pt report will using anomia compensations outside of ST room x2 sessions.    Time 4    Status Achieved      SLP LONG TERM GOAL #3   Title Pt will utilize compensations for attention, processing and auditory comprehension when being given instructions, medical or insurance information at home and work with rare min A over 3 sessions    Time 1    Period Weeks    Status Achieved      SLP LONG TERM GOAL #4   Title Pt will verbalize anticipatory awareness before making safety decisions at work and household chores with rare min A over 3 sessions    Baseline 10/30/19    Time 3    Period Days    Status Partially Met      SLP LONG TERM GOAL #5   Title Pt will log activities for brain health over 3 sessions (exercise, mindfulness, cognitive activities, etc).    Baseline 10/30/19 11/04/19    Time 3    Period Weeks    Status Achieved     SLP LONG TERM GOAL #6   Title Pt will report using strategies for attention to complete home projects over 3 session (breaks, single-tasking, using a checklist/preplanning).    Baseline 10/30/19    Time 3    Period Weeks    Status Achieved            Plan - 11/05/19 1534    Clinical Impression Statement [name] has carried over compensatory strategies of attention and memory at work and home. She continues to report difficulty carrying them over consistently, however she has been using notetaking and preplanning since last session to help with recall and attention. Neuropsych felt pt would benefit from ongoing ST, but also from addressing ADHD with her MD or psychiatrist, and she will start ADHD meds tomorrow. Patient has met goals and agrees to d/c ST at this itme    Speech Therapy Frequency 2x / week    Duration --   12 weeks or 26 visists   Treatment/Interventions SLP instruction and feedback;Cueing hierarchy;Environmental controls;Language facilitation;Compensatory techniques;Cognitive reorganization;Functional  tasks;Compensatory strategies;Patient/family education;Multimodal communcation approach;Internal/external aids    Potential to Achieve Goals Good    Potential Considerations Co-morbidities   bi polar and ADHD          Patient will benefit from skilled therapeutic intervention in order to improve the following deficits and impairments:   Cognitive communication deficit    Problem List Patient Active Problem List   Diagnosis Date Noted  .  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 Family history of genetic disease carrier   . Family history of breast cancer   . Family history of colon cancer   . History of multiple concussions 11/20/2017  . 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 Insomnia 02/17/2015  . Right shoulder pain 12/31/2014  . Episodic cluster headache, not intractable 12/02/2014  . 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 Photophobia of both eyes 05/04/2014  . Nausea with vomiting 05/04/2014  . Bipolar I disorder, most recent episode mixed ([place]) 04/18/2014  . Suicidal ideation 04/12/2014  . Injury of right shoulder and upper arm 02/17/2014  . Migraine with status migrainosus 01/08/2013  . Chronic migraine 05/08/2012  . Contact dermatitis 11/27/2011  . Generalized anxiety disorder 10/27/2011  . 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 Borderline personality disorder ([place]) 10/27/2011  . Right foot pain 09/28/2011  . 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 Malignant tumor of muscle ([place]) 09/02/2010  . Ganglion cyst 09/29/2009  . Pes planus 07/01/2008    Tinsley Lomas, Annye Rusk MS, CCC-SLP 11/05/2019, 3:38 PM  [facility] [address] [place], [place], [address] Phone: [phone]   Fax:  [phone]   Name: LISAANN ATHA MRN: [id number] Date of Birth: 02-13-1970

## 2019-11-13 DIAGNOSIS — J3089 Other allergic rhinitis: Secondary | ICD-10-CM | POA: Diagnosis not present

## 2019-11-13 DIAGNOSIS — J301 Allergic rhinitis due to pollen: Secondary | ICD-10-CM | POA: Diagnosis not present

## 2019-11-13 DIAGNOSIS — R21 Rash and other nonspecific skin eruption: Secondary | ICD-10-CM | POA: Diagnosis not present

## 2019-12-09 DIAGNOSIS — F319 Bipolar disorder, unspecified: Secondary | ICD-10-CM | POA: Diagnosis not present

## 2019-12-09 DIAGNOSIS — F3130 Bipolar disorder, current episode depressed, mild or moderate severity, unspecified: Secondary | ICD-10-CM | POA: Diagnosis not present

## 2019-12-09 DIAGNOSIS — G8929 Other chronic pain: Secondary | ICD-10-CM | POA: Diagnosis not present

## 2019-12-09 DIAGNOSIS — I10 Essential (primary) hypertension: Secondary | ICD-10-CM | POA: Diagnosis not present

## 2019-12-09 DIAGNOSIS — G43001 Migraine without aura, not intractable, with status migrainosus: Secondary | ICD-10-CM | POA: Diagnosis not present

## 2020-01-15 ENCOUNTER — Encounter: Payer: Self-pay | Admitting: Adult Health

## 2020-01-15 ENCOUNTER — Other Ambulatory Visit: Payer: Self-pay

## 2020-01-15 ENCOUNTER — Ambulatory Visit (INDEPENDENT_AMBULATORY_CARE_PROVIDER_SITE_OTHER): Payer: PPO | Admitting: Adult Health

## 2020-01-15 VITALS — BP 124/72 | HR 70 | Ht 69.5 in | Wt 257.4 lb

## 2020-01-15 DIAGNOSIS — G43009 Migraine without aura, not intractable, without status migrainosus: Secondary | ICD-10-CM | POA: Diagnosis not present

## 2020-01-15 DIAGNOSIS — R41840 Attention and concentration deficit: Secondary | ICD-10-CM

## 2020-01-15 MED ORDER — SUMATRIPTAN SUCCINATE REFILL 6 MG/0.5ML ~~LOC~~ SOCT: SUBCUTANEOUS | 5 refills | Status: DC

## 2020-01-15 MED ORDER — EMGALITY 120 MG/ML ~~LOC~~ SOAJ: 120.0000 mg | SUBCUTANEOUS | 11 refills | Status: DC

## 2020-01-15 NOTE — Patient Instructions (Signed)
Your Plan:  Continue Emgality and sumatriptan  If your symptoms worsen or you develop new symptoms please let us know.    Thank you for coming to see [place] at [facility]. I hope we have been able to provide you high quality care today.  You may receive a patient satisfaction survey over the next few weeks. We would appreciate your feedback and comments so that we may continue to improve ourselves and the health of our patients.

## 2020-01-15 NOTE — Progress Notes (Signed)
PATIENT: [name] DOB: [birth year]  REASON FOR VISIT: follow up HISTORY FROM: patient  HISTORY OF PRESENT ILLNESS: Today 01/15/20:  [name] is a 49 year old female with a history of postconcussive syndrome, migraine headaches and memory disturbance.  She returns today for follow-up.  At the last visit she was referred to neuropsychology for memory evaluation.  They felt that her memory disturbance was due to ADHD.  It was recommended that she be put back on stimulant medication.  She reports that her psychiatrist did put her back on Adderall.  She has found it somewhat beneficial.  She continues to struggle some at work with inputting information.  Migraines have been controlled on Emgality and Depakote.  She returns today for an evaluation.  07/21/19: [name] is a 49 year old female with a history of postconcussive syndrome and migraine headaches.  She returns today for follow-up.  She states that she has been seeing speech therapy and feels that it has helped some.  She reports her therapist has mentioned additional testing with neuropsychology.  Patient states that she works at [employer].  States that she had a customer recently who was on the phone while checking out.  She states that she became very overwhelmed over this incident.  She states that she also has occasions where she forgets what she goes into a room for.  Often she does remember it later.  She states that she often loses her train of thought in mid sentence.  Or may not be able to find the right word to say.  She also states when she is working if a customer gives her her email address she has a hard time processing it.  She was on Adderall when she was in school.  She states that her psychiatrist stopped this when school ended.  She is not been on this medication for quite some time.  She returns today for an evaluation.  HISTORY 02/17/19:  [name] is a 49 year old female with a history of postconcussive syndrome  and migraine headaches.  She returns today for follow-up.  The patient reports that Toradol injection resolved her headache.  She has not had a headache since then.  She continues on Depakote and Emgality.  Her next injection is February 10 for [employer].  She continues to have chronic dizziness.  She also reports some memory issues but is unsure if it is just "brain fog" she states on occasion she will also drop things but this has been ongoing.  The patient had a CT of the head back in 2019 after hitting her head that was unremarkable.  She returns today for an evaluation.  REVIEW OF SYSTEMS: Out of a complete 14 system review of symptoms, the patient complains only of the following symptoms, and all other reviewed systems are negative.  See HPI  ALLERGIES: Allergies  Allergen Reactions  . Adhesive [Tape] Itching and Rash    Also reacted to Steri Strips and Band-Aids.  . Dilaudid [Hydromorphone Hcl] Itching  . Morphine Nausea And Vomiting  . Penicillins Hives    Has patient had a PCN reaction causing immediate rash, facial/tongue/throat swelling, SOB or lightheadedness with hypotension: YES Has patient had a PCN reaction causing severe rash involving mucus membranes or skin necrosis: NO Has patient had a PCN reaction that required hospitalization NO Has patient had a PCN reaction occurring within the last 10 years:NO If all of the above answers are "NO", then may proceed with Cephalosporin use.  [name] Percocet [  Oxycodone-Acetaminophen] Itching  . Prednisone Hives  . Provera [Medroxyprogesterone Acetate] Other (See Comments)    Causes manic episodes  . Ultram [Tramadol Hcl] Itching    HOME MEDICATIONS: Outpatient Medications Prior to Visit  Medication Sig Dispense Refill  . CALCIUM PO Take 1 tablet by mouth in the morning and at bedtime.    . Cholecalciferol (VITAMIN D3 PO) Take 1 capsule by mouth daily.    . clobetasol cream (TEMOVATE) 0.05 %   3  . Cyanocobalamin (B-12 PO) Take 1 tablet  by mouth daily.    . diclofenac (VOLTAREN) 75 MG EC tablet TAKE 1 TABLET(75 MG) BY MOUTH TWICE DAILY 60 tablet 1  . divalproex (DEPAKOTE ER) 500 MG 24 hr tablet TAKE 2 TABLETS BY MOUTH AT BEDTIME FOR MOOD 60 tablet 5  . Galcanezumab-gnlm (EMGALITY) 120 MG/ML SOAJ Inject 120 mg into the skin every 30 (thirty) days. 1 mL 11  . hydrOXYzine (VISTARIL) 50 MG capsule Take 50 mg by mouth in the morning and at bedtime.   2  . lamoTRIgine (LAMICTAL) 150 MG tablet Take 300 mg by mouth at bedtime.  1  . LATUDA 20 MG TABS tablet Take 20 mg by mouth every morning.    [name] levocetirizine (XYZAL) 5 MG tablet Take 5 mg by mouth every evening.  1  . lurasidone (LATUDA) 80 MG TABS tablet Take 1 tablet (80 mg total) by mouth daily with supper. For mood control 30 tablet 0  . metoprolol succinate (TOPROL-XL) 100 MG 24 hr tablet Take 1 tablet (100 mg total) by mouth daily. Take with or immediately following a meal. 30 tablet 0  . Multiple Vitamin (MULTIVITAMIN WITH MINERALS) TABS tablet Take 1 tablet by mouth daily.    . Multiple Vitamins-Minerals (ZINC PO) Take 1 tablet by mouth daily.    . promethazine (PHENERGAN) 25 MG tablet TK 1 T PO BID FOR 15 DAYS PRN (Patient taking differently: Take 25 mg by mouth 2 (two) times daily as needed for nausea or vomiting. TK 1 T PO BID FOR 15 DAYS PRN) 30 tablet 0  . SUMAtriptan Succinate Refill 6 MG/0.5ML SOCT Inject 0.5 ml SQ at the onset of migraine. May repeat in 2 hours if needed. (Patient taking differently: Inject 0.5 mLs into the skin as needed (migraine). Inject 0.5 ml SQ at the onset of migraine. May repeat in 2 hours if needed.) 0.5 mL 5  . traZODone (DESYREL) 100 MG tablet Take 100 mg by mouth at bedtime.    . traZODone (DESYREL) 50 MG tablet Take 1 tablet (50 mg total) by mouth at bedtime as needed for sleep. 30 tablet 0   Facility-Administered Medications Prior to Visit  Medication Dose Route Frequency Provider Last Rate Last Admin  . methylPREDNISolone acetate  (DEPO-MEDROL) injection 40 mg  40 mg Intra-articular Once Hudnall, Sharyn Lull, MD      . valproate (DEPACON) 1,000 mg in dextrose 5 % 50 mL IVPB  1,000 mg Intravenous Once Dohmeier, Asencion Partridge, MD        PAST MEDICAL HISTORY: Past Medical History:  Diagnosis Date  . Achilles tendinitis   . ADHD (attention deficit hyperactivity disorder), inattentive type    Diagnosed as an adult after starting college  . Allergy   . Arthritis   . Bipolar I disorder 04/18/2014   most recent episode mixed  . Borderline personality disorder 10/27/2011  . Cataracts, bilateral   . Chronic pain disorder    due to several injuries affecting numerous areas of her  body throughout the years  . 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 Eczema   . Episodic cluster headache, not intractable 12/02/2014  . Family history of genetic disease carrier   . Ganglion cyst 09/29/2009   left wrist (2 cyst)  . Generalized anxiety disorder 10/27/2011  . History of multiple concussions    October 2018 and September 2019  . Hyperprolactinemia   . Hypertension   . Insomnia 02/17/2015  . Lipoma   . 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 Migraine with status migrainosus 01/08/2013  . 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 Pes planus   . Photophobia of both eyes 05/04/2014    PAST SURGICAL HISTORY: Past Surgical History:  Procedure Laterality Date  . ANKLE SURGERY  12/88   left   . chest nodule  1990?   rt chest wall nodule removal  . GANGLION CYST EXCISION  2011  . lipoma removal    . right bunioectomy    . SHOULDER SURGERY  01/13/2011   right, partial tear  . tumor resection left thigh      FAMILY HISTORY: Family History  Problem Relation Age of Onset  . Hypertension Mother   . Hyperlipidemia Mother   . Breast cancer Mother 60        genetic testing- CHEK2 likely pathogenic variant  . Heart attack Father   . Heart disease Father   . Hypertension Father   . Bipolar disorder Father   . Diabetes Paternal Grandfather   . Heart disease Maternal Aunt   . Breast cancer Maternal Aunt 65  . Heart disease Maternal Grandmother   . Colon cancer Maternal Grandmother 66  . Colon cancer Maternal Uncle 75  . Lymphoma Maternal Grandfather 61  . Breast cancer Maternal Aunt 65  . Breast cancer Maternal Aunt 35  . Breast cancer Maternal Aunt   . Bone cancer Cousin 28  . Cervical cancer Cousin        Genetic testing- 'was positive for a gene' had a mastectomy    SOCIAL HISTORY: Social History   Socioeconomic History  . Marital status: Single    Spouse name: Not on file  . Number of children: 0  . Years of education: 16  . Highest education level: Bachelor's degree (e.g., BA, AB, BS)  Occupational History    Employer: [employer]  Tobacco Use  . Smoking status: Never Smoker  . Smokeless tobacco: Never Used  Substance and Sexual Activity  . Alcohol use: No    Alcohol/week: 0.0 standard drinks  . Drug use: No  . Sexual activity: Never    Birth control/protection: Pill  Other Topics Concern  . Not on file  Social History Narrative   Caffeine  2 sodas daily, 1 cup coffee daily.   Social Determinants of Health   Financial Resource Strain: Not on file  Food Insecurity: Not on file  Transportation Needs: Not on file  Physical Activity: Not on file  Stress: Not on file  Social Connections: Not on file  Intimate Partner Violence: Not on file      PHYSICAL EXAM  Vitals:   01/15/20 0903  Weight: 257 lb 6.4 oz (116.8 kg)  Height: 5' 9.5" (1.765 m)   Body mass index is 37.47 kg/m.  Generalized: Well developed, in no acute distress   Neurological examination  Mentation: Alert oriented to time, place, history taking. Follows all commands speech and language fluent Cranial nerve II-XII: Pupils were equal round reactive  to light. Extraocular movements were full, visual field were full on confrontational test.  Head turning and shoulder shrug  were normal and symmetric. Motor: The motor testing reveals 5 over 5 strength of all 4 extremities. Good symmetric motor tone is noted throughout.  Sensory: Sensory testing is intact to soft touch on all 4 extremities. No evidence of extinction is noted.  Coordination: Cerebellar testing reveals good finger-nose-finger and heel-to-shin bilaterally.  Gait and station: Gait is normal.  Reflexes: Deep tendon reflexes are symmetric and normal bilaterally.   DIAGNOSTIC DATA (LABS, IMAGING, TESTING) - I reviewed patient records, labs, notes, testing and imaging myself where available.  Lab Results  Component Value Date   WBC 4.8 05/08/2019   HGB 13.3 05/08/2019   HCT 39.0 05/08/2019   MCV 94.6 05/08/2019   PLT 167 05/08/2019      Component Value Date/Time   NA 139 05/08/2019 1200   NA 140 02/17/2019 1344   K 4.1 05/08/2019 1200   CL 105 05/08/2019 1200   CO2 25 02/17/2019 1344   GLUCOSE 92 05/08/2019 1200   BUN 10 05/08/2019 1200   BUN 9 02/17/2019 1344   CREATININE 1.00 05/08/2019 1200   CREATININE 0.83 09/11/2011 1524   CALCIUM 9.5 02/17/2019 1344   PROT 5.5 (L) 02/17/2019 1344   ALBUMIN 4.1 02/17/2019 1344   AST 16 02/17/2019 1344   ALT 9 02/17/2019 1344   ALKPHOS 67 02/17/2019 1344   BILITOT 0.3 02/17/2019 1344   GFRNONAA 65 02/17/2019 1344   GFRAA 75 02/17/2019 1344   Lab Results  Component Value Date   CHOL 198 12/29/2016   HDL 53 12/29/2016   LDLCALC 118 (H) 12/29/2016   TRIG 136 12/29/2016   CHOLHDL 3.7 12/29/2016   Lab Results  Component Value Date   HGBA1C 5.1 12/29/2016   No results found for: [id number] Lab Results  Component Value Date   TSH 4.288 12/29/2016      ASSESSMENT AND PLAN 49 y.o. year old female  has a past medical history of Achilles tendinitis, ADHD (attention deficit hyperactivity disorder), inattentive type,  Allergy, Arthritis, Bipolar I disorder (04/18/2014), Borderline personality disorder (10/27/2011), Cataracts, bilateral, Chronic pain disorder, Chronic paroxysmal hemicrania, not intractable (12/02/2014), Eczema, Episodic cluster headache, not intractable (12/02/2014), Family history of genetic disease carrier, Ganglion cyst (09/29/2009), Generalized anxiety disorder (10/27/2011), History of multiple concussions, Hyperprolactinemia, Hypertension, Insomnia (02/17/2015), Lipoma, Malignant tumor of muscle (09/02/2010), Migraine with status migrainosus (29/79/8921), Monoallelic 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 here with:  Memory disturbance  --Currently back on Adderall for ADHD managed by psychiatry  History of postconcussive syndrome Migraine headaches  --Continue Emgality --Continue Depakote 1000 mg at bedtime --Blood work today  Advised if symptoms worsen or she develops new symptoms she should let us know follow-up in 6 months or sooner if needed     I spent 30 minutes of face-to-face and non-face-to-face time with patient.  This included previsit chart review, lab review, study review, order entry, electronic health record documentation, patient education.  Ward Givens, MSN, NP-C 01/15/2020, 9:03 AM [facility] [address] [place], [place] [address] [phone]

## 2020-01-16 LAB — COMPREHENSIVE METABOLIC PANEL
ALT: 7 IU/L (ref 0–32)
AST: 14 IU/L (ref 0–40)
Albumin/Globulin Ratio: 2.4 — ABNORMAL HIGH (ref 1.2–2.2)
Albumin: 4.1 g/dL (ref 3.8–4.8)
Alkaline Phosphatase: 70 IU/L (ref 44–121)
BUN/Creatinine Ratio: 9 (ref 9–23)
BUN: 10 mg/dL (ref 6–24)
Bilirubin Total: 0.4 mg/dL (ref 0.0–1.2)
CO2: 23 mmol/L (ref 20–29)
Calcium: 9.6 mg/dL (ref 8.7–10.2)
Chloride: 106 mmol/L (ref 96–106)
Creatinine, Ser: 1.08 mg/dL — ABNORMAL HIGH (ref 0.57–1.00)
GFR calc Af Amer: 69 mL/min/{1.73_m2} (ref >59)
GFR calc non Af Amer: 60 mL/min/{1.73_m2} (ref >59)
Globulin, Total: 1.7 g/dL (ref 1.5–4.5)
Glucose: 81 mg/dL (ref 65–99)
Potassium: 4.6 mmol/L (ref 3.5–5.2)
Sodium: 142 mmol/L (ref 134–144)
Total Protein: 5.8 g/dL — ABNORMAL LOW (ref 6.0–8.5)

## 2020-01-16 LAB — CBC WITH DIFFERENTIAL/PLATELET
Basophils Absolute: 0.1 10*3/uL (ref 0.0–0.2)
Basos: 1 %
EOS (ABSOLUTE): 0.2 10*3/uL (ref 0.0–0.4)
Eos: 3 %
Hematocrit: 44 % (ref 34.0–46.6)
Hemoglobin: 14.8 g/dL (ref 11.1–15.9)
Immature Grans (Abs): 0 10*3/uL (ref 0.0–0.1)
Immature Granulocytes: 0 %
Lymphocytes Absolute: 2.7 10*3/uL (ref 0.7–3.1)
Lymphs: 38 %
MCH: 30 pg (ref 26.6–33.0)
MCHC: 33.6 g/dL (ref 31.5–35.7)
MCV: 89 fL (ref 79–97)
Monocytes Absolute: 0.7 10*3/uL (ref 0.1–0.9)
Monocytes: 11 %
Neutrophils Absolute: 3.4 10*3/uL (ref 1.4–7.0)
Neutrophils: 47 %
Platelets: 218 10*3/uL (ref 150–450)
RBC: 4.94 x10E6/uL (ref 3.77–5.28)
RDW: 12.3 % (ref 11.7–15.4)
WBC: 7 10*3/uL (ref 3.4–10.8)

## 2020-01-16 LAB — VALPROIC ACID LEVEL: Valproic Acid Lvl: 71 ug/mL (ref 50–100)

## 2020-01-20 ENCOUNTER — Other Ambulatory Visit: Payer: Self-pay | Admitting: Adult Health

## 2020-01-20 MED ORDER — PROMETHAZINE HCL 25 MG PO TABS: 25.0000 mg | ORAL_TABLET | Freq: Three times a day (TID) | ORAL | 0 refills | Status: DC | PRN

## 2020-01-20 NOTE — Addendum Note (Signed)
Addended by: Oliver Hum S on: 01/20/2020 03:00 PM   Modules accepted: Orders

## 2020-01-20 NOTE — Telephone Encounter (Signed)
Pt is requesting a refill for promethazine (PHENERGAN) 25 MG tablet.  Pharmacy: [facility] [phone]

## 2020-02-04 ENCOUNTER — Telehealth: Payer: Self-pay | Admitting: Adult Health

## 2020-02-04 NOTE — Telephone Encounter (Signed)
Pt called, feel like I'm going to pass out. Started couple of week ago, lasting 10 mins. Would like a call from the nurse.

## 2020-02-04 NOTE — Telephone Encounter (Signed)
Called pt.  She states she has had more recently the last 2 wks.  Of episodes of feeling dizzy?feeling like elevator drop, L pressure in head.  Duration 10 minutes. (sometime 2 day other time 1 day).  Will have migraine afterwards sometimes, like has one today L sided pressure in head. Level 6-7 hard to think.  Is eating and drinking ok.  Taking her medications.  No new medications.  Feels like when she had concussion and had vestibular rehab back 2019.  Made appt to eval 02-05-20 at 1000.  She appreciated call back.

## 2020-02-05 ENCOUNTER — Ambulatory Visit: Payer: PPO | Admitting: Adult Health

## 2020-02-05 ENCOUNTER — Other Ambulatory Visit: Payer: Self-pay

## 2020-02-05 ENCOUNTER — Encounter: Payer: Self-pay | Admitting: Adult Health

## 2020-02-05 VITALS — BP 130/78 | HR 70 | Ht 69.5 in | Wt 257.0 lb

## 2020-02-05 DIAGNOSIS — R42 Dizziness and giddiness: Secondary | ICD-10-CM | POA: Diagnosis not present

## 2020-02-05 DIAGNOSIS — G43009 Migraine without aura, not intractable, without status migrainosus: Secondary | ICD-10-CM

## 2020-02-05 DIAGNOSIS — R269 Unspecified abnormalities of gait and mobility: Secondary | ICD-10-CM | POA: Diagnosis not present

## 2020-02-05 NOTE — Patient Instructions (Addendum)
Your Plan:  Continue to monitor symptoms-- if symptoms worsen or you develop new symptoms then we will  Proceed with MRI brain Referral to rehab for vertigo and balance  Thank you for coming to see [place] at [facility]. I hope we have been able to provide you high quality care today.  You may receive a patient satisfaction survey over the next few weeks. We would appreciate your feedback and comments so that we may continue to improve ourselves and the health of our patients.

## 2020-02-05 NOTE — Progress Notes (Signed)
PATIENT: [name] DOB: 1970-08-31  REASON FOR VISIT: follow up HISTORY FROM: patient  HISTORY OF PRESENT ILLNESS: Today 02/05/20:  Ms. [name] is a 50 year old female with a history of postconcussive syndrome, migraine headaches and memory disturbance.  She returns today for follow-up.  She was just seen on 1216 and was doing well.  She states that in the last 2 weeks she has been having dizziness that she describes as the room spinning, feeling as if she may pass out during these episodes.  She also describes it as feeling as if she is on an elevator and the floor gives out.  She states in the last week the episodes have increased to at least every other day.  The episodes are typically brief and reports that she can typically hold onto the counter and it slowly resolves.  She states that she had the same symptoms when she was diagnosed with a concussion.  She went to vestibular rehab and they resolved.  She states that she did fall into her refrigerator 3 weeks ago but no significant injuries.  She states that she recently was at [facility] and bent down to pick something up off a shelf and hit her head on a cart.  However she states that her symptoms did not come after this event it was going on prior to.  01/15/20: Ms. [name] is a 50 year old female with a history of postconcussive syndrome, migraine headaches and memory disturbance.  She returns today for follow-up.  At the last visit she was referred to neuropsychology for memory evaluation.  They felt that her memory disturbance was due to ADHD.  It was recommended that she be put back on stimulant medication.  She reports that her psychiatrist did put her back on Adderall.  She has found it somewhat beneficial.  She continues to struggle some at work with inputting information.  Migraines have been controlled on Emgality and Depakote.  She returns today for an evaluation.  07/21/19: Ms. [name] is a 50 year old female with a history of  postconcussive syndrome and migraine headaches.  She returns today for follow-up.  She states that she has been seeing speech therapy and feels that it has helped some.  She reports her therapist has mentioned additional testing with neuropsychology.  Patient states that she works at [employer].  States that she had a customer recently who was on the phone while checking out.  She states that she became very overwhelmed over this incident.  She states that she also has occasions where she forgets what she goes into a room for.  Often she does remember it later.  She states that she often loses her train of thought in mid sentence.  Or may not be able to find the right word to say.  She also states when she is working if a customer gives her her email address she has a hard time processing it.  She was on Adderall when she was in school.  She states that her psychiatrist stopped this when school ended.  She is not been on this medication for quite some time.  She returns today for an evaluation.  HISTORY 02/17/19:  Ms. [name] is a 50 year old female with a history of postconcussive syndrome and migraine headaches.  She returns today for follow-up.  The patient reports that Toradol injection resolved her headache.  She has not had a headache since then.  She continues on Depakote and Emgality.  Her next injection is February 10  for [name].  She continues to have chronic dizziness.  She also reports some memory issues but is unsure if it is just "brain fog" she states on occasion she will also drop things but this has been ongoing.  The patient had a CT of the head back in 2019 after hitting her head that was unremarkable.  She returns today for an evaluation.  REVIEW OF SYSTEMS: Out of a complete 14 system review of symptoms, the patient complains only of the following symptoms, and all other reviewed systems are negative.  See HPI  ALLERGIES: Allergies  Allergen Reactions  . Adhesive [Tape] Itching and  Rash    Also reacted to Steri Strips and Band-Aids.  . Dilaudid [Hydromorphone Hcl] Itching  . Morphine Nausea And Vomiting  . Penicillins Hives    Has patient had a PCN reaction causing immediate rash, facial/tongue/throat swelling, SOB or lightheadedness with hypotension: YES Has patient had a PCN reaction causing severe rash involving mucus membranes or skin necrosis: NO Has patient had a PCN reaction that required hospitalization NO Has patient had a PCN reaction occurring within the last 10 years:NO If all of the above answers are "NO", then may proceed with Cephalosporin use.  Marland Kitchen Percocet [Oxycodone-Acetaminophen] Itching  . Prednisone Hives  . Provera [Medroxyprogesterone Acetate] Other (See Comments)    Causes manic episodes  . Ultram [Tramadol Hcl] Itching    HOME MEDICATIONS: Outpatient Medications Prior to Visit  Medication Sig Dispense Refill  . CALCIUM PO Take 1 tablet by mouth in the morning and at bedtime.    . Cholecalciferol (VITAMIN D3 PO) Take 1 capsule by mouth daily.    . Cyanocobalamin (B-12 PO) Take 1 tablet by mouth daily.    . divalproex (DEPAKOTE ER) 500 MG 24 hr tablet TAKE 2 TABLETS BY MOUTH AT BEDTIME FOR MOOD 60 tablet 5  . Galcanezumab-gnlm (EMGALITY) 120 MG/ML SOAJ Inject 120 mg into the skin every 30 (thirty) days. 1 mL 11  . hydrOXYzine (VISTARIL) 50 MG capsule Take 50 mg by mouth in the morning and at bedtime.   2  . lamoTRIgine (LAMICTAL) 150 MG tablet Take 300 mg by mouth at bedtime.  1  . LATUDA 20 MG TABS tablet Take 20 mg by mouth every morning.    . lurasidone (LATUDA) 80 MG TABS tablet Take 1 tablet (80 mg total) by mouth daily with supper. For mood control 30 tablet 0  . metoprolol succinate (TOPROL-XL) 100 MG 24 hr tablet Take 1 tablet (100 mg total) by mouth daily. Take with or immediately following a meal. 30 tablet 0  . Multiple Vitamin (MULTIVITAMIN WITH MINERALS) TABS tablet Take 1 tablet by mouth daily.    . Multiple Vitamins-Minerals  (ZINC PO) Take 1 tablet by mouth daily.    . promethazine (PHENERGAN) 25 MG tablet Take 1 tablet (25 mg total) by mouth every 8 (eight) hours as needed for nausea or vomiting. TK 1 T PO BID FOR 15 DAYS PRN 30 tablet 0  . SUMAtriptan Succinate Refill 6 MG/0.5ML SOCT Inject 0.5 ml SQ at the onset of migraine. May repeat in 2 hours if needed. 0.5 mL 5  . traZODone (DESYREL) 100 MG tablet Take 100 mg by mouth at bedtime.     Facility-Administered Medications Prior to Visit  Medication Dose Route Frequency Provider Last Rate Last Admin  . methylPREDNISolone acetate (DEPO-MEDROL) injection 40 mg  40 mg Intra-articular Once Hudnall, Sharyn Lull, MD      . valproate (DEPACON) 1,000  mg in dextrose 5 % 50 mL IVPB  1,000 mg Intravenous Once Dohmeier, Asencion Partridge, MD        PAST MEDICAL HISTORY: Past Medical History:  Diagnosis Date  . Achilles tendinitis   . ADHD (attention deficit hyperactivity disorder), inattentive type    Diagnosed as an adult after starting college  . Allergy   . Arthritis   . Bipolar I disorder 04/18/2014   most recent episode mixed  . Borderline personality disorder 10/27/2011  . Cataracts, bilateral   . Chronic pain disorder    due to several injuries affecting numerous areas of her body throughout the years  . 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 Eczema   . Episodic cluster headache, not intractable 12/02/2014  . Family history of genetic disease carrier   . Ganglion cyst 09/29/2009   left wrist (2 cyst)  . Generalized anxiety disorder 10/27/2011  . History of multiple concussions    October 2018 and September 2019  . Hyperprolactinemia   . Hypertension   . Insomnia 02/17/2015  . Lipoma   . 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 Migraine with status migrainosus 01/08/2013  . 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 Pes planus   . Photophobia of both eyes 05/04/2014    PAST SURGICAL HISTORY: Past Surgical History:  Procedure Laterality Date  . ANKLE SURGERY  12/88   left   . chest nodule  1990?   rt chest wall nodule removal  . GANGLION CYST EXCISION  2011  . lipoma removal    . right bunioectomy    . SHOULDER SURGERY  01/13/2011   right, partial tear  . tumor resection left thigh      FAMILY HISTORY: Family History  Problem Relation Age of Onset  . Hypertension Mother   . Hyperlipidemia Mother   . Breast cancer Mother 18       genetic testing- CHEK2 likely pathogenic variant  . Heart attack Father   . Heart disease Father   . Hypertension Father   . Bipolar disorder Father   . Diabetes Paternal Grandfather   . Heart disease Maternal Aunt   . Breast cancer Maternal Aunt 65  . Heart disease Maternal Grandmother   . Colon cancer Maternal Grandmother 66  . Colon cancer Maternal Uncle 75  . Lymphoma Maternal Grandfather 38  . Breast cancer Maternal Aunt 65  . Breast cancer Maternal Aunt 35  . Breast cancer Maternal Aunt   . Bone cancer Cousin 77  . Cervical cancer Cousin        Genetic testing- 'was positive for a gene' had a mastectomy    SOCIAL HISTORY: Social History   Socioeconomic History  . Marital status: Single    Spouse name: Not on file  . Number of children: 0  . Years of education: 16  . Highest education level: Bachelor's degree (e.g., BA, AB, BS)  Occupational History    Employer: [employer]  Tobacco Use  . Smoking status: Never Smoker  . Smokeless tobacco: Never Used  Substance and Sexual Activity  . Alcohol use: No    Alcohol/week: 0.0 standard drinks  . Drug use: No  . Sexual activity: Never    Birth control/protection: Pill  Other Topics Concern  . Not on file  Social History Narrative   Caffeine  2 sodas daily, 1 cup coffee daily.   Social Determinants of Health   Financial Resource Strain: Not on file  Food  Insecurity: Not on file  Transportation Needs: Not on file  Physical Activity: Not on file  Stress: Not on file  Social Connections: Not on file  Intimate Partner Violence: Not on file      PHYSICAL EXAM  Vitals:   02/05/20 0947  BP: 130/78  Pulse: 70  Weight: 257 lb (116.6 kg)  Height: 5' 9.5" (1.765 m)   Body mass index is 37.41 kg/m.  Generalized: Well developed, in no acute distress   Neurological examination  Mentation: Alert oriented to time, place, history taking. Follows all commands speech and language fluent Cranial nerve II-XII: Pupils were equal round reactive to light. Extraocular movements were full, visual field were full on confrontational test.  Head turning and shoulder shrug  were normal and symmetric. Motor: The motor testing reveals 5 over 5 strength of all 4 extremities. Good symmetric motor tone is noted throughout.  Sensory: Sensory testing is intact to soft touch on all 4 extremities. No evidence of extinction is noted.  Coordination: Cerebellar testing reveals good finger-nose-finger and heel-to-shin bilaterally.  Gait and station: Gait is slightly unsteady.  Tandem gait is unsteady.  Romberg is negative but unsteady Reflexes: Deep tendon reflexes are symmetric and normal bilaterally.   DIAGNOSTIC DATA (LABS, IMAGING, TESTING) - I reviewed patient records, labs, notes, testing and imaging myself where available.  Lab Results  Component Value Date   WBC 7.0 01/15/2020   HGB 14.8 01/15/2020   HCT 44.0 01/15/2020   MCV 89 01/15/2020   PLT 218 01/15/2020      Component Value Date/Time   NA 142 01/15/2020 0948   K 4.6 01/15/2020 0948   CL 106 01/15/2020 0948   CO2 23 01/15/2020 0948   GLUCOSE 81 01/15/2020 0948   GLUCOSE 92 05/08/2019 1200   BUN 10 01/15/2020 0948   CREATININE 1.08 (H) 01/15/2020 0948   CREATININE 0.83 09/11/2011 1524   CALCIUM 9.6 01/15/2020 0948   PROT 5.8 (L) 01/15/2020 0948   ALBUMIN 4.1 01/15/2020 0948   AST 14  01/15/2020 0948   ALT 7 01/15/2020 0948   ALKPHOS 70 01/15/2020 0948   BILITOT 0.4 01/15/2020 0948   GFRNONAA 60 01/15/2020 0948   GFRAA 69 01/15/2020 0948   Lab Results  Component Value Date   CHOL 198 12/29/2016   HDL 53 12/29/2016   LDLCALC 118 (H) 12/29/2016   TRIG 136 12/29/2016   CHOLHDL 3.7 12/29/2016   Lab Results  Component Value Date   HGBA1C 5.1 12/29/2016   No results found for: [id number] Lab Results  Component Value Date   TSH 4.288 12/29/2016      ASSESSMENT AND PLAN 50 y.o. year old female  has a past medical history of Achilles tendinitis, ADHD (attention deficit hyperactivity disorder), inattentive type, Allergy, Arthritis, Bipolar I disorder (04/18/2014), Borderline personality disorder (10/27/2011), Cataracts, bilateral, Chronic pain disorder, Chronic paroxysmal hemicrania, not intractable (12/02/2014), Eczema, Episodic cluster headache, not intractable (12/02/2014), Family history of genetic disease carrier, Ganglion cyst (09/29/2009), Generalized anxiety disorder (10/27/2011), History of multiple concussions, Hyperprolactinemia, Hypertension, Insomnia (02/17/2015), Lipoma, Malignant tumor of muscle (09/02/2010), Migraine with status migrainosus (73/71/0626), Monoallelic 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 here with:  Vertigo Abnormality of gait and balance   Referral for vestibular rehab for possible vertigo and for evaluation of gait imbalance  Advised that if she develops new symptoms or worsening symptoms we may consider MRI of the brain.  With the exception of being slightly unsteady when ambulating her exam was unremarkable.  She is also advised that if vestibular rehab is not beneficial she should let us know.  Keep scheduled follow-up appointment     I spent 30 minutes of face-to-face and non-face-to-face time with patient.  This included previsit chart review, lab review, study  review, order entry, electronic health record documentation, patient education.  Ward Givens, MSN, NP-C 02/05/2020, 9:28 AM [facility] [address] [place], [place] [address] [phone]

## 2020-02-10 DIAGNOSIS — R41840 Attention and concentration deficit: Secondary | ICD-10-CM | POA: Diagnosis not present

## 2020-02-10 DIAGNOSIS — F319 Bipolar disorder, unspecified: Secondary | ICD-10-CM | POA: Diagnosis not present

## 2020-02-10 DIAGNOSIS — G43001 Migraine without aura, not intractable, with status migrainosus: Secondary | ICD-10-CM | POA: Diagnosis not present

## 2020-02-10 DIAGNOSIS — I10 Essential (primary) hypertension: Secondary | ICD-10-CM | POA: Diagnosis not present

## 2020-02-11 ENCOUNTER — Telehealth: Payer: Self-pay | Admitting: Adult Health

## 2020-02-11 NOTE — Telephone Encounter (Signed)
Pt is asking if her Galcanezumab-gnlm (EMGALITY) 120 MG/ML SOAJ can be switched back to pre filled syringes

## 2020-02-12 ENCOUNTER — Encounter: Payer: Self-pay | Admitting: Genetic Counselor

## 2020-02-12 MED ORDER — EMGALITY 120 MG/ML ~~LOC~~ SOSY: 120.0000 mg | PREFILLED_SYRINGE | SUBCUTANEOUS | 11 refills | Status: DC

## 2020-02-12 NOTE — Addendum Note (Signed)
Addended by: Oliver Hum S on: 02/12/2020 10:30 AM   Modules accepted: Orders

## 2020-02-12 NOTE — Telephone Encounter (Signed)
I called and LMVM for pt that if she wants to go back to syringe, not problem, but want to confirm and having issues with autoinjectior??

## 2020-02-12 NOTE — Telephone Encounter (Signed)
Spoke to pt and she prefers the [employer] syringe vs autoinjector.  Sent in new script.  Her insurance is the same info.  Heath team Adv.

## 2020-02-16 ENCOUNTER — Other Ambulatory Visit: Payer: Self-pay | Admitting: Adult Health

## 2020-02-18 DIAGNOSIS — L308 Other specified dermatitis: Secondary | ICD-10-CM | POA: Diagnosis not present

## 2020-03-01 ENCOUNTER — Telehealth: Payer: Self-pay | Admitting: Adult Health

## 2020-03-01 ENCOUNTER — Other Ambulatory Visit: Payer: Self-pay

## 2020-03-01 ENCOUNTER — Ambulatory Visit: Payer: PPO | Attending: Adult Health | Admitting: Physical Therapy

## 2020-03-01 ENCOUNTER — Encounter: Payer: Self-pay | Admitting: Physical Therapy

## 2020-03-01 DIAGNOSIS — R42 Dizziness and giddiness: Secondary | ICD-10-CM | POA: Insufficient documentation

## 2020-03-01 DIAGNOSIS — R2681 Unsteadiness on feet: Secondary | ICD-10-CM | POA: Insufficient documentation

## 2020-03-01 DIAGNOSIS — R41840 Attention and concentration deficit: Secondary | ICD-10-CM

## 2020-03-01 DIAGNOSIS — R413 Other amnesia: Secondary | ICD-10-CM

## 2020-03-01 DIAGNOSIS — G43009 Migraine without aura, not intractable, without status migrainosus: Secondary | ICD-10-CM

## 2020-03-01 NOTE — Telephone Encounter (Signed)
Called pt and she stated with the initiation of ADHD medication Adderall 20 she did have some improvement but she feels that she is getting progressively worse.  She would like to go back and see Dr. Hazle Coca.

## 2020-03-01 NOTE — Telephone Encounter (Signed)
The patient had neuropsychology evaluation in August 2021 it was recommended that she go back on ADHD medication.  She has since seen psychiatry was placed back on Adderall.  She feels that her memory has not improved since the initiation of this medication we can make a referral for her to have a repeat testing with neuropsychology.  The patient is amenable I will place order

## 2020-03-01 NOTE — Telephone Encounter (Signed)
Message From Dr. Melvyn Novas. See Below . Thanks .   ----- Message ----- From: Hazle Coca, PhD Sent: 03/01/2020   2:21 PM EST To: Virl Son  Repeat testing should be done no sooner than 12 months post due to a number of factors including practice effects (as well as most insurance plans only covering one yearly evaluation). As a concussion is a distinct event without evidence for brain damage, worsening cognition is not a part of this injury. Worsening functioning is most likely related to external factors. She would be encouraged to give the Adderall time to work, as well as focus on other recommendations made in her report. Treating mood and sleep concerns will likely improve her presentation.

## 2020-03-01 NOTE — Telephone Encounter (Signed)
[name] -  Pt stated memory decline is worse lately and she even left the keys in the ignition of the car and some other things. She would like you to call her to discuss because she is concerned. Wanted to see if she might need to come in to see you earlier than scheduled.  Thank you

## 2020-03-01 NOTE — Addendum Note (Signed)
Addended by: Brandon Melnick on: 03/01/2020 12:11 PM   Modules accepted: Orders

## 2020-03-01 NOTE — Therapy (Signed)
[facility] [address] [place], [place], [address] Phone: [phone]   Fax:  [phone]  Physical Therapy Evaluation  Patient Details  Name: [name] MRN: [id number] Date of Birth: [birth year] Referring Provider (PT): Ward Givens, NP   Encounter Date: 03/01/2020   PT End of Session - 03/01/20 2057    Visit Number 1    Number of Visits 13    Date for PT Re-Evaluation 04/30/20    Authorization Type [employer]    Authorization Time Period 03-01-20 - 05-29-20    PT Start Time 0850    PT Stop Time 0931    PT Time Calculation (min) 41 min    Activity Tolerance Patient tolerated treatment well    Behavior During Therapy [facility] for tasks assessed/performed           Past Medical History:  Diagnosis Date  . Achilles tendinitis   . ADHD (attention deficit hyperactivity disorder), inattentive type    Diagnosed as an adult after starting college  . Allergy   . Arthritis   . Bipolar I disorder 04/18/2014   most recent episode mixed  . Borderline personality disorder 10/27/2011  . Cataracts, bilateral   . Chronic pain disorder    due to several injuries affecting numerous areas of her body throughout the years  . 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 Eczema   . Episodic cluster headache, not intractable 12/02/2014  . Family history of genetic disease carrier   . Ganglion cyst 09/29/2009   left wrist (2 cyst)  . Generalized anxiety disorder 10/27/2011  . History of multiple concussions    October 2018 and September 2019  . Hyperprolactinemia   . Hypertension   . Insomnia 02/17/2015  . Lipoma   . 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 Migraine with status migrainosus 01/08/2013  . 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 Pes planus   . Photophobia of both eyes 05/04/2014    Past Surgical History:  Procedure Laterality Date  . ANKLE SURGERY  12/88   left   . chest nodule  1990?   rt chest wall nodule removal  . GANGLION CYST EXCISION  2011  . lipoma removal    . right bunioectomy    . SHOULDER SURGERY  01/13/2011   right, partial tear  . tumor resection left thigh      There were no vitals filed for this visit.    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  Pt states she has had more frequent episodes of feeling as if she is on an elevator and the floor suddenly falls out from underneath her.  Pt also reports she was in [employer] approx. late Dec. and hit her head on a cart after leaning over and returning to upright position.  Pt does report that her symptoms had started prior to this incident.    Pertinent History h/o concussions - Oct. 2018 and Sept. 2019; headaches/migraines; memory problems, ADHD, bipolar disorder, photophobia    Limitations --  Patient Stated Goals "be able to stand up straight and not feel like I am teetering over"; improve balance    Currently in Pain? No/denies              [facility] PT Assessment - 03/01/20 0856      Assessment   Medical Diagnosis Post concussive sydrome; Dizziness   Oct. 2018, Sept.  2019   Referring Provider (PT) Ward Givens, NP    Onset Date/Surgical Date --   mid December 2021 for onset of increased dizziness and balance impairments   Prior Therapy Pt received OP PT at this facility from 01-02-18 - 05-22-18 for same diagnosis (20 visits)      Precautions   Precautions None      Balance Screen   Has the patient fallen in the past 6 months Yes    How many times? 1    Has the  patient had a decrease in activity level because of a fear of falling?  Yes    Is the patient reluctant to leave their home because of a fear of falling?  No      Prior Function   Level of Independence Independent    Vocation Part time employment   works 20 hours/ week at [employer]     Observation/Other [employer] on Therapeutic Outcomes (FOTO)  50/100; risk adjusted 48/100    Other Surveys  --   DHS 48/100     Ambulation/Gait   Ambulation/Gait Yes    Ambulation/Gait Assistance 6: Modified independent (Device/Increase time)    Ambulation Distance (Feet) 100 Feet    Assistive device None    Gait Pattern Within Functional Limits    Ambulation Surface Level;Indoor    Gait velocity 10.44 = 3.14 ft/sec      Standardized Balance Assessment   Standardized Balance Assessment Timed Up and Go Test      Timed Up and Go Test   TUG Normal TUG    Normal TUG (seconds) 9.02   no device                 Vestibular Assessment - 03/01/20 0001      Symptom Behavior   Subjective history of current problem pt states she started experiencing increased dizziness and more frequent episodes of imbalance and dysequilibrium approx. mid December 2021    Type of Dizziness  Imbalance;Unsteady with head/body turns;Lightheadedness;"World moves"    Frequency of Dizziness daily    Duration of Dizziness daily    Symptom Nature Variable    Aggravating Factors Turning body quickly;Turning head quickly    Relieving Factors No known relieving factors    Progression of Symptoms Worse      Oculomotor Exam   Spontaneous Absent    Smooth Pursuits Saccades    Saccades Slow    Comment Convergence: impaired but has implanted lenses and uses reading glasses      Visual Acuity   Static line 11    Dynamic line 7              Objective measurements completed on examination: See above findings.     Romberg EO 30 secs;  EC 15 secs with feet approx. 4" apart Sharp. Romberg EO 30 secs;  EC  approx. 3 secs  Pt reports dizziness with amb. With horizontal & vertical head turns          PT Education - 03/01/20 2053    Education Details gaze stabilization in standing  Person(s) Educated Patient    Methods Explanation;Demonstration;Handout    Comprehension Verbalized understanding;Returned demonstration            PT Short Term Goals - 03/01/20 2112      PT SHORT TERM GOAL #1   Title Pt will improve gaze stabilization to </= 3 line difference for DVA.    Baseline 4 line difference on 03-01-20    Time 4    Period Weeks    Status New    Target Date 04/02/20      PT SHORT TERM GOAL #2   Title Increase FGA score by at least 4 points for improved balance with gait.    Time 4    Period Weeks    Status New    Target Date 04/02/20      PT SHORT TERM GOAL #3   Title Pt will amb. 30' with horizontal head turns with minimal sway in path deviation for increased safety with environmental scanning with amb.    Time 4    Period Weeks    Status New    Target Date 04/02/20      PT SHORT TERM GOAL #4   Title Independent in HEP for balance and vestibular exercises.    Time 4    Period Weeks    Status New    Target Date 04/02/20             PT Long Term Goals - 03/01/20 2116      PT LONG TERM GOAL #1   Title Pt will improve DVA to </= 2 line difference for improved gaze stabilization.    Baseline 4 line difference    Time 8    Period Weeks    Status New    Target Date 04/30/20      PT LONG TERM GOAL #2   Title Improve FOTO score from 48/100 to 61/100 to demo improvement in dizziness.    Time 8    Period Weeks    Status New    Target Date 04/30/20      PT LONG TERM GOAL #3   Title Increase FGA score by at least 8 points to increase safety with gait.    Time 8    Period Weeks    Status New    Target Date 04/30/20      PT LONG TERM GOAL #4   Title Improve balance so pt able to perform Romberg test EC for at least 30 secs without LOB.    Baseline 15  secs with feet 4" apart    Time 8    Period Weeks    Status New    Target Date 04/30/20      PT LONG TERM GOAL #5   Title Independent in updated HEP for balance and vestibular exs.    Time 8    Period Weeks    Status New    Target Date 04/30/20                  Plan - 03/01/20 2103    Clinical Impression Statement Pt is a 50 yr old lady with h/o multiple concussions with post-concussive syndrome with c/o dizziness and imbalance.  Pt reports episodes of increased dizziness and imbalance started approx. in mid Dec. 2021.  Pt has abnormal VOR with DVA a 4 line difference and c/o moderate dizziness provoked with testing.  Pt also has imbalance and unsteady gait with amb. with horizontal and vertical head turns.  Pt's Dizziness functional status score = 48%.  Pt also reports dizziness with horizontal head turns in sitting position.  Pt will benefit from PT to address balance and vestibular and gait deficits.    Personal Factors and Comorbidities Behavior Pattern;Comorbidity 2;Time since onset of injury/illness/exacerbation;Profession;Past/Current Experience    Comorbidities h/o multiple concussions, migraines, cluster HA's, bipolar disorder    Stability/Clinical Decision Making Evolving/Moderate complexity    Clinical Decision Making Moderate    Rehab Potential Good    PT Frequency 2x / week    PT Duration 4 weeks   followed by 1x/week for 4 weeks   PT Treatment/Interventions ADLs/Self Care Home Management;Therapeutic activities;Therapeutic exercise;Patient/family education;Vestibular;Balance training;Neuromuscular re-education;Gait training    PT Next Visit Plan check x1 viewing ex for HEP;  give balance on foam exercises; do FGA    PT Home Exercise Plan x1 viewing    Consulted and Agree with Plan of Care Patient           Patient will benefit from skilled therapeutic intervention in order to improve the following deficits and impairments:  Decreased balance,Difficulty  walking,Dizziness,Postural dysfunction,Decreased cognition  Visit Diagnosis: Dizziness and giddiness - Plan: PT plan of care cert/re-cert  Unsteadiness on feet - Plan: PT plan of care cert/re-cert     Problem List Patient Active Problem List   Diagnosis Date Noted  . 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 Family history of genetic disease carrier   . Family history of breast cancer   . Family history of colon cancer   . History of multiple concussions 11/20/2017  . 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 Insomnia 02/17/2015  . Right shoulder pain 12/31/2014  . Episodic cluster headache, not intractable 12/02/2014  . 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 Photophobia of both eyes 05/04/2014  . Nausea with vomiting 05/04/2014  . Bipolar I disorder, most recent episode mixed ([place]) 04/18/2014  . Suicidal ideation 04/12/2014  . Injury of right shoulder and upper arm 02/17/2014  . Migraine with status migrainosus 01/08/2013  . Chronic migraine 05/08/2012  . Contact dermatitis 11/27/2011  . Generalized anxiety disorder 10/27/2011  . 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 Borderline personality disorder ([place]) 10/27/2011  . Right foot pain 09/28/2011  . 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 Malignant tumor of muscle ([place]) 09/02/2010  . Ganglion cyst 09/29/2009  . Pes planus 07/01/2008    DildayJenness Corner, PT 03/01/2020, 9:24 PM  [facility] [address] [place], [place], [address] Phone: [phone]   Fax:  [phone]  Name: [name] MRN: [id number] Date of Birth: 1970/11/10

## 2020-03-02 NOTE — Telephone Encounter (Signed)
Noted. Let patient know she needs to give adderall time to work- if she feels that it is not working well for her then she should [employer] operational [employer].

## 2020-03-02 NOTE — Telephone Encounter (Signed)
I called pt and relayed that per MM/NP wiould adderall  little more time and if no improvement or still worrisome then to contact psychiatrist.  She verbalized understanding.

## 2020-03-08 ENCOUNTER — Ambulatory Visit: Payer: PPO | Admitting: Physical Therapy

## 2020-03-11 ENCOUNTER — Other Ambulatory Visit: Payer: Self-pay

## 2020-03-11 ENCOUNTER — Ambulatory Visit: Payer: PPO | Attending: Adult Health | Admitting: Physical Therapy

## 2020-03-11 DIAGNOSIS — R42 Dizziness and giddiness: Secondary | ICD-10-CM | POA: Diagnosis not present

## 2020-03-11 DIAGNOSIS — R2681 Unsteadiness on feet: Secondary | ICD-10-CM | POA: Diagnosis not present

## 2020-03-12 ENCOUNTER — Encounter: Payer: Self-pay | Admitting: Physical Therapy

## 2020-03-12 NOTE — Therapy (Signed)
[facility] [address] [place], [place], [address] Phone: [phone]   Fax:  [phone]  Physical Therapy Treatment  Patient Details  Name: [name] MRN: [id number] Date of Birth: 1970/11/15 Referring Provider (PT): Ward Givens, NP   Encounter Date: 03/11/2020   PT End of Session - 03/12/20 1412    Visit Number 2    Number of Visits 13    Date for PT Re-Evaluation 04/30/20    Authorization Type [employer]    Authorization Time Period 03-01-20 - 05-29-20    PT Start Time 0932    PT Stop Time 1015    PT Time Calculation (min) 43 min    Activity Tolerance Patient tolerated treatment well    Behavior During Therapy [facility] for tasks assessed/performed           Past Medical History:  Diagnosis Date  . Achilles tendinitis   . ADHD (attention deficit hyperactivity disorder), inattentive type    Diagnosed as an adult after starting college  . Allergy   . Arthritis   . Bipolar I disorder 04/18/2014   most recent episode mixed  . Borderline personality disorder 10/27/2011  . Cataracts, bilateral   . Chronic pain disorder    due to several injuries affecting numerous areas of her body throughout the years  . 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 Eczema   . Episodic cluster headache, not intractable 12/02/2014  . Family history of genetic disease carrier   . Ganglion cyst 09/29/2009   left wrist (2 cyst)  . Generalized anxiety disorder 10/27/2011  . History of multiple concussions    October 2018 and September 2019  . Hyperprolactinemia   . Hypertension   . Insomnia 02/17/2015  . Lipoma   . 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 Migraine with status migrainosus 01/08/2013  . 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 Pes planus   . Photophobia of both eyes 05/04/2014    Past Surgical History:  Procedure Laterality Date  . ANKLE SURGERY  12/88   left   . chest nodule  1990?   rt chest wall nodule removal  . GANGLION CYST EXCISION  2011  . lipoma removal    . right bunioectomy    . SHOULDER SURGERY  01/13/2011   right, partial tear  . tumor resection left thigh      There were no vitals filed for this visit.   Subjective Assessment - 03/11/20 0937    Subjective Pt states she has been doing the letter exercise - is able to perform for approx. 20-30 secs before dizziness really increases and she needs to stop    Pertinent History h/o concussions - Oct. 2018 and Sept. 2019; headaches/migraines; memory problems, ADHD, bipolar disorder, photophobia    Patient Stated Goals "be able to stand up straight and not feel like I am teetering over"; improve balance    Currently in Pain? No/denies                              Vestibular Treatment/Exercise - 03/12/20 0001      Vestibular Treatment/Exercise   Gaze Exercises X1 Viewing Horizontal;X1 Viewing Vertical      X1 Viewing Horizontal   Foot Position bil.  stance    Time --   30 secs   Reps 1    Comments in standing, plain background      X1 Viewing Vertical   Foot Position bil. stance    Time --   25 secs   Reps 1    Comments in standing, plain background              Balance Exercises - 03/12/20 0001      Balance Exercises: Standing   Standing Eyes Opened Wide (BOA);Head turns;Foam/compliant surface;5 reps   horizontal and vertical head turns   Standing Eyes Closed Wide (BOA);Head turns;Foam/compliant surface;5 reps   horizontal and vertical head turns - standing on 2 pillows in corner   Rockerboard Anterior/posterior;EO;10 reps    Retro Gait 2 reps   25' x 2 reps   Marching Foam/compliant surface;Static;Head turns;5 reps   EO - no head turns, then horizontal and vertical head turns   Other Standing Exercises  trunk rotations side to side, then diagonal pattern each direction 5 reps each - with EO and EC               PT Short Term Goals - 03/12/20 1415      PT SHORT TERM GOAL #1   Title Pt will improve gaze stabilization to </= 3 line difference for DVA.    Baseline 4 line difference on 03-01-20    Time 4    Period Weeks    Status New    Target Date 04/02/20      PT SHORT TERM GOAL #2   Title Increase FGA score by at least 4 points for improved balance with gait.    Time 4    Period Weeks    Status New    Target Date 04/02/20      PT SHORT TERM GOAL #3   Title Pt will amb. 30' with horizontal head turns with minimal sway in path deviation for increased safety with environmental scanning with amb.    Time 4    Period Weeks    Status New    Target Date 04/02/20      PT SHORT TERM GOAL #4   Title Independent in HEP for balance and vestibular exercises.    Time 4    Period Weeks    Status New    Target Date 04/02/20             PT Long Term Goals - 03/12/20 1415      PT LONG TERM GOAL #1   Title Pt will improve DVA to </= 2 line difference for improved gaze stabilization.    Baseline 4 line difference    Time 8    Period Weeks    Status New      PT LONG TERM GOAL #2   Title Improve FOTO score from 48/100 to 61/100 to demo improvement in dizziness.    Time 8    Period Weeks    Status New      PT LONG TERM GOAL #3   Title Increase FGA score by at least 8 points to increase safety with gait.    Time 8    Period Weeks    Status New      PT LONG TERM GOAL #4   Title Improve balance so pt able to perform Romberg test EC for at least 30 secs without LOB.    Baseline 15 secs with feet 4" apart    Time 8  Period Weeks    Status New      PT LONG TERM GOAL #5   Title Independent in updated HEP for balance and vestibular exs.    Time 8    Period Weeks    Status New                 Plan - 03/12/20 1412    Clinical Impression Statement Pt reported  increased dizziness with head turns with activities incorporating eye and head movement.  Pt able to perform x1 viewing exercise for only approx. 30 secs prior to onset of increased dizziness with pt requesting to stop exercise.  Cont with POC.    Personal Factors and Comorbidities Behavior Pattern;Comorbidity 2;Time since onset of injury/illness/exacerbation;Profession;Past/Current Experience    Comorbidities h/o multiple concussions, migraines, cluster HA's, bipolar disorder    Stability/Clinical Decision Making Evolving/Moderate complexity    Rehab Potential Good    PT Frequency 2x / week    PT Duration 4 weeks   followed by 1x/week for 4 weeks   PT Treatment/Interventions ADLs/Self Care Home Management;Therapeutic activities;Therapeutic exercise;Patient/family education;Vestibular;Balance training;Neuromuscular re-education;Gait training    PT Next Visit Plan check x1 viewing ex for HEP;  give balance on foam exercises; do FGA    PT Home Exercise Plan x1 viewing    Consulted and Agree with Plan of Care Patient           Patient will benefit from skilled therapeutic intervention in order to improve the following deficits and impairments:  Decreased balance,Difficulty walking,Dizziness,Postural dysfunction,Decreased cognition  Visit Diagnosis: Unsteadiness on feet  Dizziness and giddiness     Problem List Patient Active Problem List   Diagnosis Date Noted  . 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 Family history of genetic disease carrier   . Family history of breast cancer   . Family history of colon cancer   . History of multiple concussions 11/20/2017  . 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 Insomnia 02/17/2015  . Right shoulder pain 12/31/2014  . Episodic cluster headache, not intractable 12/02/2014  . 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 Photophobia of both eyes 05/04/2014  . Nausea with  vomiting 05/04/2014  . Bipolar I disorder, most recent episode mixed ([place]) 04/18/2014  . Suicidal ideation 04/12/2014  . Injury of right shoulder and upper arm 02/17/2014  . Migraine with status migrainosus 01/08/2013  . Chronic migraine 05/08/2012  . Contact dermatitis 11/27/2011  . Generalized anxiety disorder 10/27/2011  . 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 Borderline personality disorder ([place]) 10/27/2011  . Right foot pain 09/28/2011  . 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 Malignant tumor of muscle ([place]) 09/02/2010  . Ganglion cyst 09/29/2009  . Pes planus 07/01/2008    DildayJenness Corner, PT 03/12/2020, 2:17 PM  [facility] [address] [place], [place], [address] Phone: [phone]   Fax:  [phone]  Name: [name] MRN: [id number] Date of Birth: 1970-06-19

## 2020-03-15 ENCOUNTER — Ambulatory Visit: Payer: PPO | Admitting: Physical Therapy

## 2020-03-15 ENCOUNTER — Other Ambulatory Visit: Payer: Self-pay

## 2020-03-15 DIAGNOSIS — R2681 Unsteadiness on feet: Secondary | ICD-10-CM

## 2020-03-15 DIAGNOSIS — R42 Dizziness and giddiness: Secondary | ICD-10-CM

## 2020-03-15 DIAGNOSIS — M79661 Pain in right lower leg: Secondary | ICD-10-CM | POA: Diagnosis not present

## 2020-03-15 DIAGNOSIS — M79669 Pain in unspecified lower leg: Secondary | ICD-10-CM | POA: Diagnosis not present

## 2020-03-15 NOTE — Therapy (Signed)
[facility] [address] [place], [place], [address] Phone: [phone]   Fax:  [phone]  Physical Therapy Treatment  Patient Details  Name: [name] MRN: [id number] Date of Birth: 1970-07-09 Referring Provider (PT): Ward Givens, NP   Encounter Date: 03/15/2020   PT End of Session - 03/15/20 2054    Visit Number 3    Number of Visits 13    Date for PT Re-Evaluation 04/30/20    Authorization Type [employer]    Authorization Time Period 03-01-20 - 05-29-20    PT Start Time 0802    PT Stop Time 0847    PT Time Calculation (min) 45 min    Activity Tolerance Patient tolerated treatment well    Behavior During Therapy [facility] for tasks assessed/performed           Past Medical History:  Diagnosis Date  . Achilles tendinitis   . ADHD (attention deficit hyperactivity disorder), inattentive type    Diagnosed as an adult after starting college  . Allergy   . Arthritis   . Bipolar I disorder 04/18/2014   most recent episode mixed  . Borderline personality disorder 10/27/2011  . Cataracts, bilateral   . Chronic pain disorder    due to several injuries affecting numerous areas of her body throughout the years  . 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 Eczema   . Episodic cluster headache, not intractable 12/02/2014  . Family history of genetic disease carrier   . Ganglion cyst 09/29/2009   left wrist (2 cyst)  . Generalized anxiety disorder 10/27/2011  . History of multiple concussions    October 2018 and September 2019  . Hyperprolactinemia   . Hypertension   . Insomnia 02/17/2015  . Lipoma   . 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 Migraine with status migrainosus 01/08/2013  . 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 Pes planus   . Photophobia of both eyes 05/04/2014    Past Surgical History:  Procedure Laterality Date  . ANKLE SURGERY  12/88   left   . chest nodule  1990?   rt chest wall nodule removal  . GANGLION CYST EXCISION  2011  . lipoma removal    . right bunioectomy    . SHOULDER SURGERY  01/13/2011   right, partial tear  . tumor resection left thigh      There were no vitals filed for this visit.   Subjective Assessment - 03/15/20 0805    Subjective Pt states she was very dizzy on Friday evening and was also dizzy on Saturday at work; states she worked 8-5 yesterday (Sunday)  and had to take a couple of seated rest breaks    Pertinent History h/o concussions - Oct. 2018 and Sept. 2019; headaches/migraines; memory problems, ADHD, bipolar disorder, photophobia    Patient Stated Goals "be able to stand up straight and not feel like I am teetering over"; improve balance    Currently in Pain? No/denies                              Vestibular Treatment/Exercise - 03/15/20 0001      Vestibular Treatment/Exercise   Gaze Exercises X1 Viewing Horizontal;X1 Viewing Vertical      X1  Viewing Horizontal   Foot Position bil. stance    Time --   30 secs   Reps 1    Comments in standing, plain background      X1 Viewing Vertical   Foot Position bil. stance    Time --   30 secs   Reps 1    Comments in standing, plain background              Balance Exercises - 03/15/20 0001      Balance Exercises: Standing   Standing Eyes Closed Wide (BOA);Head turns;Foam/compliant surface;5 reps   horizontal and vertical head turns - standing on 2 pillows in corner   Rockerboard Anterior/posterior;EO;10 reps    Retro Gait 2 reps   inside // bars   Marching Foam/compliant surface;Static;10 reps   horizontal head turns   Other Standing Exercises pt performed standing on blue mat - holding small ball - reaching down toward Lt foot and returning to standing with rotation to Rt  side & then reaching down toward Rt foot and return to standing with rotation to Lt side;  pt performed standing on blue mat - figure 8 with ball between legs x 1 rep with 180 degree turn x 3 reps with CGA    Other Standing Exercises Comments Pt performed amb. 89' x 1 rep with horizontal head turns with SBA               PT Short Term Goals - 03/15/20 2104      PT SHORT TERM GOAL #1   Title Pt will improve gaze stabilization to </= 3 line difference for DVA.    Baseline 4 line difference on 03-01-20    Time 4    Period Weeks    Status New    Target Date 04/02/20      PT SHORT TERM GOAL #2   Title Increase FGA score by at least 4 points for improved balance with gait.    Time 4    Period Weeks    Status New    Target Date 04/02/20      PT SHORT TERM GOAL #3   Title Pt will amb. 30' with horizontal head turns with minimal sway in path deviation for increased safety with environmental scanning with amb.    Time 4    Period Weeks    Status New    Target Date 04/02/20      PT SHORT TERM GOAL #4   Title Independent in HEP for balance and vestibular exercises.    Time 4    Period Weeks    Status New    Target Date 04/02/20             PT Long Term Goals - 03/15/20 2104      PT LONG TERM GOAL #1   Title Pt will improve DVA to </= 2 line difference for improved gaze stabilization.    Baseline 4 line difference    Time 8    Period Weeks    Status New      PT LONG TERM GOAL #2   Title Improve FOTO score from 48/100 to 61/100 to demo improvement in dizziness.    Time 8    Period Weeks    Status New      PT LONG TERM GOAL #3   Title Increase FGA score by at least 8 points to increase safety with gait.    Time 8    Period Weeks  Status New      PT LONG TERM GOAL #4   Title Improve balance so pt able to perform Romberg test EC for at least 30 secs without LOB.    Baseline 15 secs with feet 4" apart    Time 8    Period Weeks    Status New      PT LONG TERM  GOAL #5   Title Independent in updated HEP for balance and vestibular exs.    Time 8    Period Weeks    Status New                 Plan - 03/15/20 2056    Clinical Impression Statement Pt continues to tolerate x1 viewing for 30 secs both horizontally and vertically, needing to stop exercise after approx. 30 secs due to dizziness provoked.  Pt reported minimal dizziness with figure 8 exercise with ball between knees, bending down and returning to upright position.  Head and eye movement appears to provoke more dizziness than standing on compliant surface (in today's session).  Cont with POC.    Personal Factors and Comorbidities Behavior Pattern;Comorbidity 2;Time since onset of injury/illness/exacerbation;Profession;Past/Current Experience    Comorbidities h/o multiple concussions, migraines, cluster HA's, bipolar disorder    Stability/Clinical Decision Making Evolving/Moderate complexity    Rehab Potential Good    PT Frequency 2x / week    PT Duration 4 weeks   followed by 1x/week for 4 weeks   PT Treatment/Interventions ADLs/Self Care Home Management;Therapeutic activities;Therapeutic exercise;Patient/family education;Vestibular;Balance training;Neuromuscular re-education;Gait training    PT Next Visit Plan check x1 viewing ex for HEP;  give balance on foam exercises; do FGA    PT Home Exercise Plan x1 viewing    Consulted and Agree with Plan of Care Patient           Patient will benefit from skilled therapeutic intervention in order to improve the following deficits and impairments:  Decreased balance,Difficulty walking,Dizziness,Postural dysfunction,Decreased cognition  Visit Diagnosis: Unsteadiness on feet  Dizziness and giddiness     Problem List Patient Active Problem List   Diagnosis Date Noted  . 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 Family history of genetic disease carrier   . Family history of breast cancer   . Family history  of colon cancer   . History of multiple concussions 11/20/2017  . 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 Insomnia 02/17/2015  . Right shoulder pain 12/31/2014  . Episodic cluster headache, not intractable 12/02/2014  . 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 Photophobia of both eyes 05/04/2014  . Nausea with vomiting 05/04/2014  . Bipolar I disorder, most recent episode mixed ([place]) 04/18/2014  . Suicidal ideation 04/12/2014  . Injury of right shoulder and upper arm 02/17/2014  . Migraine with status migrainosus 01/08/2013  . Chronic migraine 05/08/2012  . Contact dermatitis 11/27/2011  . Generalized anxiety disorder 10/27/2011  . 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 Borderline personality disorder ([place]) 10/27/2011  . Right foot pain 09/28/2011  . 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 Malignant tumor of muscle ([place]) 09/02/2010  . Ganglion cyst 09/29/2009  . Pes planus 07/01/2008    Alda Lea, PT 03/15/2020, [place] [address] [place], [place], [address] Phone: [phone]   Fax:  [phone]  Name: [name] MRN: [id number] Date of Birth: [birth year]

## 2020-03-16 DIAGNOSIS — G8929 Other chronic pain: Secondary | ICD-10-CM | POA: Diagnosis not present

## 2020-03-16 DIAGNOSIS — I10 Essential (primary) hypertension: Secondary | ICD-10-CM | POA: Diagnosis not present

## 2020-03-16 DIAGNOSIS — G43001 Migraine without aura, not intractable, with status migrainosus: Secondary | ICD-10-CM | POA: Diagnosis not present

## 2020-03-16 DIAGNOSIS — F3130 Bipolar disorder, current episode depressed, mild or moderate severity, unspecified: Secondary | ICD-10-CM | POA: Diagnosis not present

## 2020-03-16 NOTE — Telephone Encounter (Signed)
Pt is asking for a call from Sandy,RN to relay what she was advised of re: her memory by her psychiatrist.  Pt aware of appointment date but is wanting to discuss this before June

## 2020-03-17 ENCOUNTER — Telehealth: Payer: Self-pay | Admitting: *Deleted

## 2020-03-17 NOTE — Telephone Encounter (Signed)
I called pt she states that her memory is about same.  She did touch base with her psychiatrist, adderall helps with focus/attention out of system in 24 hours.  Seeing Dr. Melvyn Novas yearly would be in 08/2020.  Starts PT for vertigo/balance just at eval.  I told her that is good. Will relay to MM/NP and if she has any other suggestions for her relating to memory and then let her know if so. Has appt in 06/2020.

## 2020-03-17 NOTE — Telephone Encounter (Signed)
Pt returned call.  I called her back and called her work # [phone] she was working in [employer]. N/a, called mobile and LMVM that returned call will have to reach her tomorrow.

## 2020-03-17 NOTE — Telephone Encounter (Signed)
I called pt and LMVM for her to return call.   

## 2020-03-17 NOTE — Telephone Encounter (Signed)
Noted  

## 2020-03-18 ENCOUNTER — Ambulatory Visit: Payer: PPO | Admitting: Physical Therapy

## 2020-03-18 ENCOUNTER — Other Ambulatory Visit: Payer: Self-pay

## 2020-03-18 ENCOUNTER — Encounter: Payer: Self-pay | Admitting: Physical Therapy

## 2020-03-18 DIAGNOSIS — R42 Dizziness and giddiness: Secondary | ICD-10-CM

## 2020-03-18 DIAGNOSIS — R2681 Unsteadiness on feet: Secondary | ICD-10-CM | POA: Diagnosis not present

## 2020-03-19 NOTE — Therapy (Signed)
[facility] [address] [place], [place], [address] Phone: [phone]   Fax:  [phone]  Physical Therapy Treatment  Patient Details  Name: [name] MRN: [id number] Date of Birth: Aug 25, 1970 Referring Provider (PT): Ward Givens, NP   Encounter Date: 03/18/2020   PT End of Session - 03/19/20 1207    Visit Number 4    Number of Visits 13    Date for PT Re-Evaluation 04/30/20    Authorization Type [employer]    Authorization Time Period 03-01-20 - 05-29-20    PT Start Time 0800    PT Stop Time 0844    PT Time Calculation (min) 44 min    Activity Tolerance Patient tolerated treatment well    Behavior During Therapy [facility] for tasks assessed/performed           Past Medical History:  Diagnosis Date  . Achilles tendinitis   . ADHD (attention deficit hyperactivity disorder), inattentive type    Diagnosed as an adult after starting college  . Allergy   . Arthritis   . Bipolar I disorder 04/18/2014   most recent episode mixed  . Borderline personality disorder 10/27/2011  . Cataracts, bilateral   . Chronic pain disorder    due to several injuries affecting numerous areas of her body throughout the years  . 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 Eczema   . Episodic cluster headache, not intractable 12/02/2014  . Family history of genetic disease carrier   . Ganglion cyst 09/29/2009   left wrist (2 cyst)  . Generalized anxiety disorder 10/27/2011  . History of multiple concussions    October 2018 and September 2019  . Hyperprolactinemia   . Hypertension   . Insomnia 02/17/2015  . Lipoma   . 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 Migraine with status migrainosus 01/08/2013  . 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 Pes planus   . Photophobia of both eyes 05/04/2014    Past Surgical History:  Procedure Laterality Date  . ANKLE SURGERY  12/88   left   . chest nodule  1990?   rt chest wall nodule removal  . GANGLION CYST EXCISION  2011  . lipoma removal    . right bunioectomy    . SHOULDER SURGERY  01/13/2011   right, partial tear  . tumor resection left thigh      There were no vitals filed for this visit.   Subjective Assessment - 03/18/20 0801    Subjective Pt reports she went to Walk in clinic at [facility] on Monday after PT for evaluation of the "knot" on posterior Rt calf; states they diagnosed as a lipoma; goes Tuesday to her PCP for further evaluation - states they wll order ultrasound to determine the size of it    Pertinent History h/o concussions - Oct. 2018 and Sept. 2019; headaches/migraines; memory problems, ADHD, bipolar disorder, photophobia    Patient Stated Goals "be able to stand up straight and not feel like I am teetering over"; improve balance    Currently in Pain? Yes    Pain Score 3     Pain Location Leg    Pain Orientation Right    Pain Descriptors / Indicators Dull;Discomfort;Tingling    Pain Type Acute pain    Pain Onset In the  past 7 days    Pain Frequency Constant    Aggravating Factors  weight bearing    Pain Relieving Factors rest - lying down helps                              Vestibular Treatment/Exercise - 03/19/20 0001      Vestibular Treatment/Exercise   Gaze Exercises X1 Viewing Horizontal;X1 Viewing Vertical      X1 Viewing Horizontal   Foot Position bil. stance    Time --   30 secs   Reps 2    Comments in standing, patterned background      X1 Viewing Vertical   Foot Position bil. stance    Time --   30 secs   Reps 2    Comments in standing, patterned background              Balance Exercises - 03/19/20 0001      Balance Exercises: Standing   Standing Eyes Closed Wide (BOA);Head turns;Foam/compliant surface;5  reps   horizontal and vertical head turns - standing on 2 pillows in corner   Rockerboard Anterior/posterior;EO;EC;Other reps (comment);Intermittent UE support   20 reps - 10 with EO, 10 with EC   Gait with Head Turns Forward;2 reps   20' x 2 reps   Marching Foam/compliant surface;Static;10 reps   horizontal head turns   Other Standing Exercises pt performed standing on blue mat - holding small ball - reaching down toward Lt foot and returning to standing with rotation to Rt side & then reaching down toward Rt foot and return to standing with rotation to Lt side;  pt performed standing on blue mat - figure 8 with ball between legs x 1 rep with 180 degree turn x 3 reps with CGA               PT Short Term Goals - 03/19/20 1213      PT SHORT TERM GOAL #1   Title Pt will improve gaze stabilization to </= 3 line difference for DVA.    Baseline 4 line difference on 03-01-20    Time 4    Period Weeks    Status New    Target Date 04/02/20      PT SHORT TERM GOAL #2   Title Increase FGA score by at least 4 points for improved balance with gait.    Time 4    Period Weeks    Status New    Target Date 04/02/20      PT SHORT TERM GOAL #3   Title Pt will amb. 30' with horizontal head turns with minimal sway in path deviation for increased safety with environmental scanning with amb.    Time 4    Period Weeks    Status New    Target Date 04/02/20      PT SHORT TERM GOAL #4   Title Independent in HEP for balance and vestibular exercises.    Time 4    Period Weeks    Status New    Target Date 04/02/20             PT Long Term Goals - 03/19/20 1213      PT LONG TERM GOAL #1   Title Pt will improve DVA to </= 2 line difference for improved gaze stabilization.    Baseline 4 line difference    Time 8    Period Weeks  Status New      PT LONG TERM GOAL #2   Title Improve FOTO score from 48/100 to 61/100 to demo improvement in dizziness.    Time 8    Period Weeks    Status New       PT LONG TERM GOAL #3   Title Increase FGA score by at least 8 points to increase safety with gait.    Time 8    Period Weeks    Status New      PT LONG TERM GOAL #4   Title Improve balance so pt able to perform Romberg test EC for at least 30 secs without LOB.    Baseline 15 secs with feet 4" apart    Time 8    Period Weeks    Status New      PT LONG TERM GOAL #5   Title Independent in updated HEP for balance and vestibular exs.    Time 8    Period Weeks    Status New                 Plan - 03/19/20 1207    Clinical Impression Statement Pt able to tolerate x1 viewing exercise with target on patterned background for 30 secs with minimal increase in dizziness after 30 secs;  pt did well with activity of bending down & returning to upright without LOB.  Cont with POC.    Personal Factors and Comorbidities Behavior Pattern;Comorbidity 2;Time since onset of injury/illness/exacerbation;Profession;Past/Current Experience    Comorbidities h/o multiple concussions, migraines, cluster HA's, bipolar disorder    Stability/Clinical Decision Making Evolving/Moderate complexity    Rehab Potential Good    PT Frequency 2x / week    PT Duration 4 weeks   followed by 1x/week for 4 weeks   PT Treatment/Interventions ADLs/Self Care Home Management;Therapeutic activities;Therapeutic exercise;Patient/family education;Vestibular;Balance training;Neuromuscular re-education;Gait training    PT Next Visit Plan check x1 viewing ex for HEP;  give balance on foam exercises; do FGA    PT Home Exercise Plan x1 viewing    Consulted and Agree with Plan of Care Patient           Patient will benefit from skilled therapeutic intervention in order to improve the following deficits and impairments:  Decreased balance,Difficulty walking,Dizziness,Postural dysfunction,Decreased cognition  Visit Diagnosis: Unsteadiness on feet  Dizziness and giddiness     Problem List Patient Active Problem  List   Diagnosis Date Noted  . 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 Family history of genetic disease carrier   . Family history of breast cancer   . Family history of colon cancer   . History of multiple concussions 11/20/2017  . 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 Insomnia 02/17/2015  . Right shoulder pain 12/31/2014  . Episodic cluster headache, not intractable 12/02/2014  . 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 Photophobia of both eyes 05/04/2014  . Nausea with vomiting 05/04/2014  . Bipolar I disorder, most recent episode mixed ([place]) 04/18/2014  . Suicidal ideation 04/12/2014  . Injury of right shoulder and upper arm 02/17/2014  . Migraine with status migrainosus 01/08/2013  . Chronic migraine 05/08/2012  . Contact dermatitis 11/27/2011  . Generalized anxiety disorder 10/27/2011  . 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 Borderline personality disorder ([place]) 10/27/2011  . Right foot pain 09/28/2011  . 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 Malignant tumor of muscle ([place])  09/02/2010  . Ganglion cyst 09/29/2009  . Pes planus 07/01/2008    Alda Lea, PT 03/19/2020, 12:17 PM  [place] [facility] [address] [place], [place], [address] Phone: [phone]   Fax:  [phone]  Name: ELZABETH MCQUERRY MRN: [id number] Date of Birth: 11-03-1970

## 2020-03-22 ENCOUNTER — Ambulatory Visit: Payer: PPO | Admitting: Physical Therapy

## 2020-03-22 ENCOUNTER — Other Ambulatory Visit: Payer: Self-pay

## 2020-03-22 ENCOUNTER — Encounter: Payer: Self-pay | Admitting: Physical Therapy

## 2020-03-22 DIAGNOSIS — R42 Dizziness and giddiness: Secondary | ICD-10-CM

## 2020-03-22 DIAGNOSIS — R2681 Unsteadiness on feet: Secondary | ICD-10-CM | POA: Diagnosis not present

## 2020-03-23 DIAGNOSIS — M79661 Pain in right lower leg: Secondary | ICD-10-CM | POA: Diagnosis not present

## 2020-03-23 NOTE — Therapy (Addendum)
[facility] [address] [place], [place], [address] Phone: [phone]   Fax:  [phone]  Physical Therapy Treatment  Patient Details  Name: [name] MRN: [id number] Date of Birth: 1970/07/17 Referring Provider (PT): Ward Givens, NP   Encounter Date: 03/22/2020   PT End of Session - 03/23/20 1923    Visit Number 5    Number of Visits 13    Date for PT Re-Evaluation 04/30/20    Authorization Type [employer]    Authorization Time Period 03-01-20 - 05-29-20    PT Start Time 0802    PT Stop Time 0846    PT Time Calculation (min) 44 min    Activity Tolerance Patient tolerated treatment well    Behavior During Therapy [facility] for tasks assessed/performed           Past Medical History:  Diagnosis Date  . Achilles tendinitis   . ADHD (attention deficit hyperactivity disorder), inattentive type    Diagnosed as an adult after starting college  . Allergy   . Arthritis   . Bipolar I disorder 04/18/2014   most recent episode mixed  . Borderline personality disorder 10/27/2011  . Cataracts, bilateral   . Chronic pain disorder    due to several injuries affecting numerous areas of her body throughout the years  . 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 Eczema   . Episodic cluster headache, not intractable 12/02/2014  . Family history of genetic disease carrier   . Ganglion cyst 09/29/2009   left wrist (2 cyst)  . Generalized anxiety disorder 10/27/2011  . History of multiple concussions    October 2018 and September 2019  . Hyperprolactinemia   . Hypertension   . Insomnia 02/17/2015  . Lipoma   . 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 Migraine with status migrainosus 01/08/2013  . 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 Pes planus   . Photophobia of both eyes 05/04/2014    Past Surgical History:  Procedure Laterality Date  . ANKLE SURGERY  12/88   left   . chest nodule  1990?   rt chest wall nodule removal  . GANGLION CYST EXCISION  2011  . lipoma removal    . right bunioectomy    . SHOULDER SURGERY  01/13/2011   right, partial tear  . tumor resection left thigh      There were no vitals filed for this visit.      03/22/20 0802  Symptoms/Limitations  Subjective Pt reports she has appt. for  her leg tomorrow - for the fibroma on her Rt posterior calf  Pertinent History h/o concussions - Oct. 2018 and Sept. 2019; headaches/migraines; memory problems, ADHD, bipolar disorder, photophobia  Patient Stated Goals "be able to stand up straight and not feel like I am teetering over"; improve balance  Pain Assessment  Currently in Pain? Yes  Pain Score 4  Pain Location Leg  Pain Orientation Right  Pain Descriptors / Indicators Dull;Discomfort  Pain Type Acute pain  Pain Onset In the past 7 days                     [facility] Adult PT Treatment/Exercise - 03/23/20 0001      High Level Balance   High Level Balance Activities Backward walking;Turns;Sudden  stops;Head turns           Vestibular Treatment/Exercise - 03/23/20 0001      X1 Viewing Horizontal   Foot Position bil. stance    Time --   60 secs   Reps 2    Comments in standing, plain background      X1 Viewing Vertical   Foot Position bil. stance    Time --   45 secs   Reps 2    Comments in standing, plain background              Balance Exercises - 03/23/20 0001      Balance Exercises: Standing   Standing Eyes Closed Wide (BOA);Head turns;Foam/compliant surface;5 reps   horizontal and vertical   Rockerboard Anterior/posterior;Head turns;EO;EC;Other reps (comment);Intermittent UE support   20 reps total - 10 with EC, 10 with EO   Gait with Head Turns Forward;1 rep    Marching Foam/compliant  surface;Static;10 reps   horizontal head turns   Other Standing Exercises pt performed standing on blue mat - holding small ball - reaching down toward Lt foot and returning to standing with rotation to Rt side & then reaching down toward Rt foot and return to standing with rotation to Lt side;  pt performed standing on blue mat - figure 8 with ball between legs x 1 rep with 180 degree turn x 3 reps with CGA    Other Standing Exercises Comments performed marching on incline with EO and EC; with horizontal and vertical head turns 5 reps each - standing on incline (no compliant surface on top)               PT Short Term Goals - 03/23/20 1929      PT SHORT TERM GOAL #1   Title Pt will improve gaze stabilization to </= 3 line difference for DVA.    Baseline 4 line difference on 03-01-20    Time 4    Period Weeks    Status New    Target Date 04/02/20      PT SHORT TERM GOAL #2   Title Increase FGA score by at least 4 points for improved balance with gait.    Time 4    Period Weeks    Status New    Target Date 04/02/20      PT SHORT TERM GOAL #3   Title Pt will amb. 30' with horizontal head turns with minimal sway in path deviation for increased safety with environmental scanning with amb.    Time 4    Period Weeks    Status New    Target Date 04/02/20      PT SHORT TERM GOAL #4   Title Independent in HEP for balance and vestibular exercises.    Time 4    Period Weeks    Status New    Target Date 04/02/20             PT Long Term Goals - 03/23/20 1929      PT LONG TERM GOAL #1   Title Pt will improve DVA to </= 2 line difference for improved gaze stabilization.    Baseline 4 line difference    Time 8    Period Weeks    Status New      PT LONG TERM GOAL #2   Title Improve FOTO score from 48/100 to 61/100 to demo improvement in dizziness.    Time 8    Period Weeks  Status New      PT LONG TERM GOAL #3   Title Increase FGA score by at least 8 points to increase  safety with gait.    Time 8    Period Weeks    Status New      PT LONG TERM GOAL #4   Title Improve balance so pt able to perform Romberg test EC for at least 30 secs without LOB.    Baseline 15 secs with feet 4" apart    Time 8    Period Weeks    Status New      PT LONG TERM GOAL #5   Title Independent in updated HEP for balance and vestibular exs.    Time 8    Period Weeks    Status New             03/22/20 0830  Plan  Clinical Impression Statement Pt able to perform and tolerate x1 viewing for longer duration than that performed in previous session; 60 secs horizontal head turns with target on plain background and 45 secs vertical head turns with target on plain background.  Pt is improving with maintaining balance with vestibular exercises.  Personal Factors and Comorbidities Behavior Pattern;Comorbidity 2;Time since onset of injury/illness/exacerbation;Profession;Past/Current Experience  Comorbidities h/o multiple concussions, migraines, cluster HA's, bipolar disorder  Pt will benefit from skilled therapeutic intervention in order to improve on the following deficits Decreased balance;Difficulty walking;Dizziness;Postural dysfunction;Decreased cognition  Stability/Clinical Decision Making Evolving/Moderate complexity  Rehab Potential Good  PT Frequency 2x / week  PT Duration 4 weeks (followed by 1x/week for 4 weeks)  PT Treatment/Interventions ADLs/Self Care Home Management;Therapeutic activities;Therapeutic exercise;Patient/family education;Vestibular;Balance training;Neuromuscular re-education;Gait training  PT Next Visit Plan do FGA, cont with balance/vestibular exercises  PT Home Exercise Plan x1 viewing  Consulted and Agree with Plan of Care Patient          Patient will benefit from skilled therapeutic intervention in order to improve the following deficits and impairments:  Decreased balance,Difficulty walking,Dizziness,Postural dysfunction,Decreased  cognition  Visit Diagnosis: Unsteadiness on feet  Dizziness and giddiness     Problem List Patient Active Problem List   Diagnosis Date Noted  . 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 Family history of genetic disease carrier   . Family history of breast cancer   . Family history of colon cancer   . History of multiple concussions 11/20/2017  . 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 Insomnia 02/17/2015  . Right shoulder pain 12/31/2014  . Episodic cluster headache, not intractable 12/02/2014  . 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 Photophobia of both eyes 05/04/2014  . Nausea with vomiting 05/04/2014  . Bipolar I disorder, most recent episode mixed ([place]) 04/18/2014  . Suicidal ideation 04/12/2014  . Injury of right shoulder and upper arm 02/17/2014  . Migraine with status migrainosus 01/08/2013  . Chronic migraine 05/08/2012  . Contact dermatitis 11/27/2011  . Generalized anxiety disorder 10/27/2011  . 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 Borderline personality disorder ([place]) 10/27/2011  . Right foot pain 09/28/2011  . 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 Malignant tumor of muscle ([place]) 09/02/2010  . Ganglion cyst 09/29/2009  . Pes planus 07/01/2008    Alda Lea, PT 03/23/2020, 7:30 PM  [facility] [address] [place], [place], [address] Phone: [phone]   Fax:  [phone]  Name: [name] MRN:  [id number] Date of Birth: [birth year]

## 2020-03-25 ENCOUNTER — Ambulatory Visit: Payer: PPO | Admitting: Physical Therapy

## 2020-03-25 ENCOUNTER — Encounter: Payer: Self-pay | Admitting: Physical Therapy

## 2020-03-25 ENCOUNTER — Encounter: Payer: Self-pay | Admitting: Sports Medicine

## 2020-03-25 ENCOUNTER — Ambulatory Visit (INDEPENDENT_AMBULATORY_CARE_PROVIDER_SITE_OTHER): Payer: PPO | Admitting: Sports Medicine

## 2020-03-25 ENCOUNTER — Ambulatory Visit
Admission: RE | Admit: 2020-03-25 | Discharge: 2020-03-25 | Disposition: A | Discharge status: Home / Self Care | Payer: PPO | Source: Ambulatory Visit | Attending: Sports Medicine | Admitting: Sports Medicine

## 2020-03-25 ENCOUNTER — Other Ambulatory Visit: Payer: Self-pay

## 2020-03-25 ENCOUNTER — Other Ambulatory Visit: Payer: Self-pay | Admitting: Sports Medicine

## 2020-03-25 VITALS — BP 126/70 | Ht 69.5 in | Wt 260.5 lb

## 2020-03-25 DIAGNOSIS — M7989 Other specified soft tissue disorders: Secondary | ICD-10-CM

## 2020-03-25 DIAGNOSIS — R2681 Unsteadiness on feet: Secondary | ICD-10-CM | POA: Diagnosis not present

## 2020-03-25 DIAGNOSIS — M25552 Pain in left hip: Secondary | ICD-10-CM

## 2020-03-25 DIAGNOSIS — M79661 Pain in right lower leg: Secondary | ICD-10-CM | POA: Diagnosis not present

## 2020-03-25 DIAGNOSIS — R42 Dizziness and giddiness: Secondary | ICD-10-CM

## 2020-03-25 NOTE — Patient Instructions (Addendum)
Gaze Stabilization: Tip Card  1.Target must remain in focus, not blurry, and appear stationary while head is in motion. 2.Perform exercises with small head movements (45 to either side of midline). 3.Increase speed of head motion so long as target is in focus. 4.If you wear eyeglasses, be sure you can see target through lens (therapist will give specific instructions for bifocal / progressive lenses). 5.These exercises may provoke dizziness or nausea. Work through these symptoms. If too dizzy, slow head movement slightly. Rest between each exercise. 6.Exercises demand concentration; avoid distractions. 7.For safety, perform standing exercises close to a counter, wall, corner, or next to someone.  Gaze Stabilization: Standing Feet Apart    Have a chair in front for support.  Feet shoulder width apart, keeping eyes on target on wall _5-6___ feet away, tilt head down 15-30 and move head side to side for _60_ seconds. Repeat while moving head up and down for __60_ seconds. Do 3-5_ sessions per day.   Feet Together, Head Motion - Eyes Open    With eyes open, feet together, move head slowly: up and down 10 times.  Then perform head turns to the left and right, 10 times slowly. Repeat 1 times per session. Do 2 sessions per day.   Feet Apart (Compliant Surface) Head Motion - Eyes Open    With eyes open, standing on compliant surface: pillow, feet shoulder width apart, turn your head/neck and shoulders to look over your right shoulder and then turn your upper body to look over your left shoulder.  Repeat 10 times turning to look over each shoulder. Repeat 10 times per session.  Forward Bend    Standing feet apart on pillow and a chair in front of you.  Bend forward at hips and knees and reach down to touch the chair in front of you.  Stand back up with control (You don't have to raise your arms).  Reset your balance and repeat. Repeat _10__ times. Do __2_ times per  day.

## 2020-03-25 NOTE — Therapy (Addendum)
[facility] [address] [place], [place], [address] Phone: [phone]   Fax:  [phone]  Physical Therapy Treatment  Patient Details  Name: [name] MRN: [id number] Date of Birth: 06-04-1970 Referring Provider (PT): Ward Givens, NP   Encounter Date: 03/25/2020   PT End of Session - 03/25/20 1745    Visit Number 6    Number of Visits 13    Date for PT Re-Evaluation 04/30/20    Authorization Type [employer]    Authorization Time Period 03-01-20 - 05-29-20    PT Start Time 0805    PT Stop Time 0850    PT Time Calculation (min) 45 min    Activity Tolerance Patient tolerated treatment well    Behavior During Therapy [facility] for tasks assessed/performed           Past Medical History:  Diagnosis Date  . Achilles tendinitis   . ADHD (attention deficit hyperactivity disorder), inattentive type    Diagnosed as an adult after starting college  . Allergy   . Arthritis   . Bipolar I disorder 04/18/2014   most recent episode mixed  . Borderline personality disorder 10/27/2011  . Cataracts, bilateral   . Chronic pain disorder    due to several injuries affecting numerous areas of her body throughout the years  . 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 Eczema   . Episodic cluster headache, not intractable 12/02/2014  . Family history of genetic disease carrier   . Ganglion cyst 09/29/2009   left wrist (2 cyst)  . Generalized anxiety disorder 10/27/2011  . History of multiple concussions    October 2018 and September 2019  . Hyperprolactinemia   . Hypertension   . Insomnia 02/17/2015  . Lipoma   . 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 Migraine with status migrainosus 01/08/2013  . 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 Pes planus   . Photophobia of both eyes 05/04/2014    Past Surgical History:  Procedure Laterality Date  . ANKLE SURGERY  12/88   left   . chest nodule  1990?   rt chest wall nodule removal  . GANGLION CYST EXCISION  2011  . lipoma removal    . right bunioectomy    . SHOULDER SURGERY  01/13/2011   right, partial tear  . tumor resection left thigh      There were no vitals filed for this visit.   Subjective Assessment - 03/25/20 1753    Subjective They were not able to determine anything about her calf; going to another appointment today for ultrasound.  Still painful.    Pertinent History h/o concussions - Oct. 2018 and Sept. 2019; headaches/migraines; memory problems, ADHD, bipolar disorder, photophobia    Patient Stated Goals "be able to stand up straight and not feel like I am teetering over"; improve balance    Currently in Pain? Yes    Pain Onset In the past 7 days                              Vestibular Treatment/Exercise - 03/25/20 0810      Vestibular Treatment/Exercise   Vestibular Treatment Provided Gaze;Habituation    Habituation Exercises Standing Vertical Head Turns    Gaze  Exercises X1 Viewing Horizontal;X1 Viewing Vertical      Standing Vertical Head Turns   Number of Reps  10    Symptom Description  x2 sets bending forwards and reaching down part way to a chair and back up; intermittent LOB posteriorly.  Increased use of hip reaction to regain balance      X1 Viewing Horizontal   Foot Position feet apart, added chair to hold on to due to increased sway.    Comments 45 seconds > progressed to 60 seconds      X1 Viewing Vertical   Foot Position feet apart, finger touch to a chair in front    Comments 45 seconds > progressed to 60 seconds              Balance Exercises - 03/25/20 0842      Balance Exercises: Standing   Standing Eyes Opened Narrow base of support (BOS);Head turns;Solid surface;Other reps (comment)   feet close  > feet together, 10 head turns/nods   Turning Right;Left;10 reps;Limitations    Turning Limitations standing feet apart on pillows, upper body turns looking over R and L shoulder x 10 reps x 2 sets.             PT Education - 03/25/20 1744    Education Details progressed gaze stabilization time, added corner balance and habituation exercises to HEP; will have primary PT contact pt about adding more therapy visits    Person(s) Educated Patient    Methods Explanation;Demonstration;Handout    Comprehension Verbalized understanding;Returned demonstration            PT Short Term Goals - 03/23/20 1929      PT SHORT TERM GOAL #1   Title Pt will improve gaze stabilization to </= 3 line difference for DVA.    Baseline 4 line difference on 03-01-20    Time 4    Period Weeks    Status New    Target Date 04/02/20      PT SHORT TERM GOAL #2   Title Increase FGA score by at least 4 points for improved balance with gait.    Time 4    Period Weeks    Status New    Target Date 04/02/20      PT SHORT TERM GOAL #3   Title Pt will amb. 30' with horizontal head turns with minimal sway in path deviation for increased safety with environmental scanning with amb.    Time 4    Period Weeks    Status New    Target Date 04/02/20      PT SHORT TERM GOAL #4   Title Independent in HEP for balance and vestibular exercises.    Time 4    Period Weeks    Status New    Target Date 04/02/20             PT Long Term Goals - 03/23/20 1929      PT LONG TERM GOAL #1   Title Pt will improve DVA to </= 2 line difference for improved gaze stabilization.    Baseline 4 line difference    Time 8    Period Weeks    Status New      PT LONG TERM GOAL #2   Title Improve FOTO score from 48/100 to 61/100 to demo improvement in dizziness.    Time 8    Period Weeks    Status New      PT LONG TERM GOAL #  3   Title Increase FGA score by at least 8 points to increase safety with gait.    Time 8     Period Weeks    Status New      PT LONG TERM GOAL #4   Title Improve balance so pt able to perform Romberg test EC for at least 30 secs without LOB.    Baseline 15 secs with feet 4" apart    Time 8    Period Weeks    Status New      PT LONG TERM GOAL #5   Title Independent in updated HEP for balance and vestibular exs.    Time 8    Period Weeks    Status New                 Plan - 03/25/20 1745    Clinical Impression Statement Pt able to tolerate x1 viewing horizontal and vertical for 60 seconds today but continues to required UE support for balance.  Added corner balance for multi-sensory balance training and habituation to turning and bending forwards.  During exercises pt required cues to attend to COG and to allow her body time to utilize balance reactions before continuing with head turns or body turns.    Personal Factors and Comorbidities Behavior Pattern;Comorbidity 2;Time since onset of injury/illness/exacerbation;Profession;Past/Current Experience    Comorbidities h/o multiple concussions, migraines, cluster HA's, bipolar disorder    Stability/Clinical Decision Making Evolving/Moderate complexity    Rehab Potential Good    PT Frequency 2x / week    PT Duration 4 weeks   followed by 1x/week for 4 weeks   PT Treatment/Interventions ADLs/Self Care Home Management;Therapeutic activities;Therapeutic exercise;Patient/family education;Vestibular;Balance training;Neuromuscular re-education;Gait training    PT Next Visit Plan STG due 3/4.  corner balance and habituation added to HEP.  Progress HEP as indicated.  Habituation to looking up, bending forwards, walking with head turns, balance reactions.    PT Home Exercise Plan x1 viewing    Consulted and Agree with Plan of Care Patient           Patient will benefit from skilled therapeutic intervention in order to improve the following deficits and impairments:  Decreased balance,Difficulty walking,Dizziness,Postural  dysfunction,Decreased cognition  Visit Diagnosis: Unsteadiness on feet  Dizziness and giddiness     Problem List Patient Active Problem List   Diagnosis Date Noted  . 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 Family history of genetic disease carrier   . Family history of breast cancer   . Family history of colon cancer   . History of multiple concussions 11/20/2017  . 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 Insomnia 02/17/2015  . Right shoulder pain 12/31/2014  . Episodic cluster headache, not intractable 12/02/2014  . 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 Photophobia of both eyes 05/04/2014  . Nausea with vomiting 05/04/2014  . Bipolar I disorder, most recent episode mixed ([name]) 04/18/2014  . Suicidal ideation 04/12/2014  . Injury of right shoulder and upper arm 02/17/2014  . Migraine with status migrainosus 01/08/2013  . Chronic migraine 05/08/2012  . Contact dermatitis 11/27/2011  . Generalized anxiety disorder 10/27/2011  . 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 Borderline personality disorder ([place]) 10/27/2011  . Right foot pain 09/28/2011  . 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 Malignant tumor of muscle ([place]) 09/02/2010  . Ganglion cyst 09/29/2009  . Pes planus 07/01/2008  Rico Junker, PT, DPT 03/25/20    5:55 PM    [facility] [address] [place], [place], [address] Phone: [phone]   Fax:  [phone]  Name: [name] MRN: [id number] Date of Birth: 02-22-1970

## 2020-03-25 NOTE — Progress Notes (Addendum)
° °  Subjective:    Patient ID: [name], female    DOB: 04/16/1970, 50 y.o.   MRN: [id number]  HPI chief complaint: Right calf pain  Patient presents today complaining of several weeks of right calf pain and swelling.  She has noticed a painful palpable lump in the proximal calf.  She was recently evaluated at a local urgent care who ruled out DVT.  She then followed up at her PCPs office but ultimately decided to seek treatment here as well.  She presented in the summer 2021 with pain in the right calf but evaluation at that time, including ultrasound, showed no definitive answer for her pain other than some thrombophlebitis.  She also endorses numbness and tingling in her toes as well as along part of the plantar forefoot.  She is also complaining of some left groin pain which has been present for a few weeks without any known injury.  Pain is worse with activity.  Somewhat present at rest as well.  She is unable to take NSAIDs but has tried Tylenol without any symptom relief.  Interim medical history reviewed Medications reviewed Allergies reviewed    Review of Systems    As above Objective:   Physical Exam  Well-developed, well-nourished.  No acute distress  Right calf: There is a palpable soft tissue mass in the proximal gastroc which is tender to palpation.  No surrounding erythema or induration.  Negative Homans.  Good strength distally.  No atrophy.  Left hip: Patient does demonstrate full passive range of motion but has reproducible groin pain with internal rotation.  No tenderness over the greater trochanteric bursa.      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  Phone follow-up with those results when available.  Patient [name] also need an MRI of her right lower leg to evaluate the palpable soft tissue mass in her calf.  I have ordered that a gel  capsule be placed over the area in question.  I [name] plan on a phone follow-up with MRI results when available as well.  Addendum (03/31/2020): X-rays reviewed.  Left hip x-rays fairly unremarkable.  There may be some very early DJD but certainly nothing marked.  Right tib-fib x-rays unremarkable.  Proceed with MRI as scheduled.

## 2020-03-29 ENCOUNTER — Ambulatory Visit: Payer: PPO | Admitting: Physical Therapy

## 2020-03-31 ENCOUNTER — Ambulatory Visit: Payer: PPO | Attending: Adult Health | Admitting: Physical Therapy

## 2020-03-31 ENCOUNTER — Other Ambulatory Visit: Payer: Self-pay

## 2020-03-31 DIAGNOSIS — R42 Dizziness and giddiness: Secondary | ICD-10-CM

## 2020-03-31 DIAGNOSIS — R2681 Unsteadiness on feet: Secondary | ICD-10-CM | POA: Diagnosis not present

## 2020-03-31 DIAGNOSIS — M6281 Muscle weakness (generalized): Secondary | ICD-10-CM | POA: Diagnosis not present

## 2020-03-31 NOTE — Therapy (Signed)
[facility] [address] [place], [place], [address] Phone: [phone]   Fax:  [phone]  Physical Therapy Treatment  Patient Details  Name: [name] MRN: [id number] Date of Birth: 15-Sep-1970 Referring Provider (PT): Ward Givens, NP   Encounter Date: 03/31/2020   PT End of Session - 03/31/20 2010    Visit Number 7    Number of Visits 13    Date for PT Re-Evaluation 04/30/20    Authorization Type [employer]    Authorization Time Period 03-01-20 - 05-29-20    PT Start Time 0851    PT Stop Time 0930    PT Time Calculation (min) 39 min    Activity Tolerance Patient tolerated treatment well    Behavior During Therapy [facility] for tasks assessed/performed           Past Medical History:  Diagnosis Date  . Achilles tendinitis   . ADHD (attention deficit hyperactivity disorder), inattentive type    Diagnosed as an adult after starting college  . Allergy   . Arthritis   . Bipolar I disorder 04/18/2014   most recent episode mixed  . Borderline personality disorder 10/27/2011  . Cataracts, bilateral   . Chronic pain disorder    due to several injuries affecting numerous areas of her body throughout the years  . 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 Eczema   . Episodic cluster headache, not intractable 12/02/2014  . Family history of genetic disease carrier   . Ganglion cyst 09/29/2009   left wrist (2 cyst)  . Generalized anxiety disorder 10/27/2011  . History of multiple concussions    October 2018 and September 2019  . Hyperprolactinemia   . Hypertension   . Insomnia 02/17/2015  . Lipoma   . 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 Migraine with status migrainosus 01/08/2013  . 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  Pes planus   . Photophobia of both eyes 05/04/2014    Past Surgical History:  Procedure Laterality Date  . ANKLE SURGERY  12/88   left   . chest nodule  1990?   rt chest wall nodule removal  . GANGLION CYST EXCISION  2011  . lipoma removal    . right bunioectomy    . SHOULDER SURGERY  01/13/2011   right, partial tear  . tumor resection left thigh      There were no vitals filed for this visit.   Subjective Assessment - 03/31/20 0858    Subjective Pt reports she has MRI scheduled tomorrow night for fibroma on her RLE; has been doing exercises some    Pertinent History h/o concussions - Oct. 2018 and Sept. 2019; headaches/migraines; memory problems, ADHD, bipolar disorder, photophobia    Patient Stated Goals "be able to stand up straight and not feel like I am teetering over"; improve balance    Currently in Pain? Yes    Pain Score 5     Pain Location Leg    Pain Orientation Right    Pain Descriptors / Indicators Dull;Discomfort    Pain Onset In the past 7 days    Pain Frequency Constant  Vestibular Treatment/Exercise - 03/31/20 0001      Vestibular Treatment/Exercise   Vestibular Treatment Provided Gaze    Gaze Exercises X1 Viewing Horizontal;X1 Viewing Vertical      X1 Viewing Horizontal   Foot Position bil. stance - on floor - no UE support    Time --   30 secs   Reps 1    Comments patterned bakground      X1 Viewing Vertical   Foot Position bil. stance -    Time --   30 secs   Reps 1    Comments patterned background              Balance Exercises - 03/31/20 0001      Balance Exercises: Standing   Standing Eyes Opened Narrow base of support (BOS);Wide (BOA);Head turns;Foam/compliant surface;5 reps   horizontal and vertical head turns   Standing Eyes Closed Wide (BOA);Head turns;Foam/compliant surface;5 reps    Rockerboard Anterior/posterior;Head turns;EO;EC;Other reps (comment);Intermittent UE support   20  reps total - 10 with EC, 10 with EO   Gait with Head Turns Forward;1 rep   25'   Marching Foam/compliant surface;Static;10 reps   horizontal head turns   Other Standing Exercises pt stood on blue mat - figure 8 with medicine ball - 180 degree turns x 3 reps; 360 degree turns x 3 reps               PT Short Term Goals - 03/31/20 2015      PT SHORT TERM GOAL #1   Title Pt will improve gaze stabilization to </= 3 line difference for DVA.    Baseline 4 line difference on 03-01-20    Time 4    Period Weeks    Status New    Target Date 04/02/20      PT SHORT TERM GOAL #2   Title Increase FGA score by at least 4 points for improved balance with gait.    Time 4    Period Weeks    Status New    Target Date 04/02/20      PT SHORT TERM GOAL #3   Title Pt will amb. 30' with horizontal head turns with minimal sway in path deviation for increased safety with environmental scanning with amb.    Time 4    Period Weeks    Status New    Target Date 04/02/20      PT SHORT TERM GOAL #4   Title Independent in HEP for balance and vestibular exercises.    Time 4    Period Weeks    Status New    Target Date 04/02/20             PT Long Term Goals - 03/31/20 2016      PT LONG TERM GOAL #1   Title Pt will improve DVA to </= 2 line difference for improved gaze stabilization.    Baseline 4 line difference    Time 8    Period Weeks    Status New      PT LONG TERM GOAL #2   Title Improve FOTO score from 48/100 to 61/100 to demo improvement in dizziness.    Time 8    Period Weeks    Status New      PT LONG TERM GOAL #3   Title Increase FGA score by at least 8 points to increase safety with gait.    Time 8    Period Weeks  Status New      PT LONG TERM GOAL #4   Title Improve balance so pt able to perform Romberg test EC for at least 30 secs without LOB.    Baseline 15 secs with feet 4" apart    Time 8    Period Weeks    Status New      PT LONG TERM GOAL #5   Title  Independent in updated HEP for balance and vestibular exs.    Time 8    Period Weeks    Status New                 Plan - 03/31/20 0914    Clinical Impression Statement Pt is progressing with maintaining balance on compliant surfaces with EO but continues to have mild to mod LOB with EC, requiring UE support for recovery of LOB.  Cont with POC.    Personal Factors and Comorbidities Behavior Pattern;Comorbidity 2;Time since onset of injury/illness/exacerbation;Profession;Past/Current Experience    Comorbidities h/o multiple concussions, migraines, cluster HA's, bipolar disorder    Stability/Clinical Decision Making Evolving/Moderate complexity    Rehab Potential Good    PT Frequency 2x / week    PT Duration 4 weeks   followed by 1x/week for 4 weeks   PT Treatment/Interventions ADLs/Self Care Home Management;Therapeutic activities;Therapeutic exercise;Patient/family education;Vestibular;Balance training;Neuromuscular re-education;Gait training    PT Next Visit Plan STG due 3/4.  corner balance and habituation added to HEP.  Progress HEP as indicated.  Habituation to looking up, bending forwards, walking with head turns, balance reactions.    PT Home Exercise Plan x1 viewing    Consulted and Agree with Plan of Care Patient           Patient will benefit from skilled therapeutic intervention in order to improve the following deficits and impairments:  Decreased balance,Difficulty walking,Dizziness,Postural dysfunction,Decreased cognition  Visit Diagnosis: Unsteadiness on feet  Dizziness and giddiness     Problem List Patient Active Problem List   Diagnosis Date Noted  . 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 Family history of genetic disease carrier   . Family history of breast cancer   . Family history of colon cancer   . History of multiple concussions 11/20/2017  . 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 Insomnia 02/17/2015  . Right shoulder pain 12/31/2014   . Episodic cluster headache, not intractable 12/02/2014  . 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 Photophobia of both eyes 05/04/2014  . Nausea with vomiting 05/04/2014  . Bipolar I disorder, most recent episode mixed ([place]) 04/18/2014  . Suicidal ideation 04/12/2014  . Injury of right shoulder and upper arm 02/17/2014  . Migraine with status migrainosus 01/08/2013  . Chronic migraine 05/08/2012  . Contact dermatitis 11/27/2011  . Generalized anxiety disorder 10/27/2011  . 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 Borderline personality disorder ([place]) 10/27/2011  . Right foot pain 09/28/2011  . 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 Malignant tumor of muscle ([place]) 09/02/2010  . Ganglion cyst 09/29/2009  . Pes planus 07/01/2008    Alda Lea, PT 03/31/2020, 8:19 PM  [facility] [address] [place], [place], [address] Phone: [phone]   Fax:  [phone]  Name: [name] MRN: [id number] Date of Birth: 02/25/1970

## 2020-04-01 ENCOUNTER — Ambulatory Visit: Payer: PPO | Admitting: Physical Therapy

## 2020-04-01 ENCOUNTER — Ambulatory Visit (HOSPITAL_COMMUNITY)
Admission: RE | Admit: 2020-04-01 | Discharge: 2020-04-01 | Disposition: A | Discharge status: Home / Self Care | Payer: PPO | Source: Ambulatory Visit | Attending: Sports Medicine | Admitting: Sports Medicine

## 2020-04-01 DIAGNOSIS — M79661 Pain in right lower leg: Secondary | ICD-10-CM

## 2020-04-01 DIAGNOSIS — R42 Dizziness and giddiness: Secondary | ICD-10-CM

## 2020-04-01 DIAGNOSIS — S76801A Unspecified injury of other specified muscles, fascia and tendons at thigh level, right thigh, initial encounter: Secondary | ICD-10-CM | POA: Diagnosis not present

## 2020-04-01 DIAGNOSIS — R2681 Unsteadiness on feet: Secondary | ICD-10-CM

## 2020-04-02 NOTE — Therapy (Signed)
[facility] [address] [place], [place], [address] Phone: [phone]   Fax:  [phone]  Physical Therapy Treatment  Patient Details  Name: [name] MRN: [id number] Date of Birth: 1970-05-08 Referring Provider (PT): Ward Givens, NP   Encounter Date: 04/01/2020   PT End of Session - 04/02/20 1357    Visit Number 8    Number of Visits 13    Date for PT Re-Evaluation 04/30/20    Authorization Type [employer]    Authorization Time Period 03-01-20 - 05-29-20    PT Start Time 0800    PT Stop Time 0845    PT Time Calculation (min) 45 min    Activity Tolerance Patient tolerated treatment well    Behavior During Therapy [facility] for tasks assessed/performed           Past Medical History:  Diagnosis Date  . Achilles tendinitis   . ADHD (attention deficit hyperactivity disorder), inattentive type    Diagnosed as an adult after starting college  . Allergy   . Arthritis   . Bipolar I disorder 04/18/2014   most recent episode mixed  . Borderline personality disorder 10/27/2011  . Cataracts, bilateral   . Chronic pain disorder    due to several injuries affecting numerous areas of her body throughout the years  . 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 Eczema   . Episodic cluster headache, not intractable 12/02/2014  . Family history of genetic disease carrier   . Ganglion cyst 09/29/2009   left wrist (2 cyst)  . Generalized anxiety disorder 10/27/2011  . History of multiple concussions    October 2018 and September 2019  . Hyperprolactinemia   . Hypertension   . Insomnia 02/17/2015  . Lipoma   . 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 Migraine with status migrainosus 01/08/2013  . 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  Pes planus   . Photophobia of both eyes 05/04/2014    Past Surgical History:  Procedure Laterality Date  . ANKLE SURGERY  12/88   left   . chest nodule  1990?   rt chest wall nodule removal  . GANGLION CYST EXCISION  2011  . lipoma removal    . right bunioectomy    . SHOULDER SURGERY  01/13/2011   right, partial tear  . tumor resection left thigh      There were no vitals filed for this visit.   Subjective Assessment - 04/01/20 0804    Subjective Pt reports no new problems since yesterday    Pertinent History h/o concussions - Oct. 2018 and Sept. 2019; headaches/migraines; memory problems, ADHD, bipolar disorder, photophobia    Patient Stated Goals "be able to stand up straight and not feel like I am teetering over"; improve balance    Currently in Pain? Yes    Pain Score 3     Pain Location Shoulder    Pain Orientation Left    Pain Descriptors / Indicators Aching    Pain Onset In the past 7 days              [facility] PT Assessment - 04/02/20 0001      Functional Gait  Assessment   Gait assessed  Yes    Gait Level Surface Walks 20 ft in less than  5.5 sec, no assistive devices, good speed, no evidence for imbalance, normal gait pattern, deviates no more than 6 in outside of the 12 in walkway width.   5.97, 6.12   Change in Gait Speed Able to change speed, demonstrates mild gait deviations, deviates 6-10 in outside of the 12 in walkway width, or no gait deviations, unable to achieve a major change in velocity, or uses a change in velocity, or uses an assistive device.    Gait with Horizontal Head Turns Performs head turns smoothly with slight change in gait velocity (eg, minor disruption to smooth gait path), deviates 6-10 in outside 12 in walkway width, or uses an assistive device.    Gait with Vertical Head Turns Performs task with slight change in gait velocity (eg, minor disruption to smooth gait path), deviates 6 - 10 in outside 12 in walkway width or uses assistive device    slightly more dizziness with vertical than horizontal head turns   Gait and Pivot Turn Turns slowly, requires verbal cueing, or requires several small steps to catch balance following turn and stop    Step Over Obstacle Is able to step over one shoe box (4.5 in total height) but must slow down and adjust steps to clear box safely. May require verbal cueing.    Gait with Narrow Base of Support Ambulates 4-7 steps.    Gait with Eyes Closed Walks 20 ft, uses assistive device, slower speed, mild gait deviations, deviates 6-10 in outside 12 in walkway width. Ambulates 20 ft in less than 9 sec but greater than 7 sec.    Ambulating Backwards Walks 20 ft, uses assistive device, slower speed, mild gait deviations, deviates 6-10 in outside 12 in walkway width.    Steps Alternating feet, must use rail.    Total Score 18                              Balance Exercises - 04/02/20 0001      Balance Exercises: Standing   Rockerboard Anterior/posterior;Head turns;EO;EC;Other reps (comment);Intermittent UE support   20 reps total - 10 with EC, 10 with EO   Marching Foam/compliant surface;Static;10 reps   horizontal head turns   Other Standing Exercises standing on blue mat on incline - marching in place with EO; then with EC; marching with EO with horizontal and vertical head turns 5 reps each; marching with EC with head turns - horizontal and vertical 5 reps each with CGA    Other Standing Exercises Comments pt performed sit to stand with step and pivot turn - 3 reps to each side for habituation               PT Short Term Goals - 04/01/20 0807      PT SHORT TERM GOAL #1   Title Pt will improve gaze stabilization to </= 3 line difference for DVA.    Baseline 4 line difference on 03-01-20;  04-01-20  line 11/line 8    Time 4    Period Weeks    Status Achieved    Target Date 04/02/20      PT SHORT TERM GOAL #2   Title Increase FGA score by at least 4 points for improved balance  with gait.    Baseline FGA score 18/30 (04-01-20)    Time 4    Period Weeks    Status On-going    Target Date 04/02/20  PT SHORT TERM GOAL #3   Title Pt will amb. 30' with horizontal head turns with minimal sway in path deviation for increased safety with environmental scanning with amb.    Baseline pt reports dizziness 3/10 with turning and stopping; met 04-01-20    Time 4    Period Weeks    Status Achieved    Target Date 04/02/20      PT SHORT TERM GOAL #4   Title Independent in HEP for balance and vestibular exercises.    Time 4    Period Weeks    Status Achieved    Target Date 04/02/20             PT Long Term Goals - 04/02/20 1402      PT LONG TERM GOAL #1   Title Pt will improve DVA to </= 2 line difference for improved gaze stabilization.    Baseline 4 line difference    Time 8    Period Weeks    Status New      PT LONG TERM GOAL #2   Title Improve FOTO score from 48/100 to 61/100 to demo improvement in dizziness.    Time 8    Period Weeks    Status New      PT LONG TERM GOAL #3   Title Increase FGA score by at least 8 points to increase safety with gait.    Time 8    Period Weeks    Status New      PT LONG TERM GOAL #4   Title Improve balance so pt able to perform Romberg test EC for at least 30 secs without LOB.    Baseline 15 secs with feet 4" apart    Time 8    Period Weeks    Status New      PT LONG TERM GOAL #5   Title Independent in updated HEP for balance and vestibular exs.    Time 8    Period Weeks    Status New                 Plan - 04/02/20 1359    Clinical Impression Statement Pt has met STG's #1, 3, and 4:  STG #2 is ongoing as FGA score = 18/30.  Pt is progressing well with balance and demonstrates improvement in vestibular input in maintaining balance.    Personal Factors and Comorbidities Behavior Pattern;Comorbidity 2;Time since onset of injury/illness/exacerbation;Profession;Past/Current Experience    Comorbidities  h/o multiple concussions, migraines, cluster HA's, bipolar disorder    Stability/Clinical Decision Making Evolving/Moderate complexity    Rehab Potential Good    PT Frequency 2x / week    PT Duration 4 weeks   followed by 1x/week for 4 weeks   PT Treatment/Interventions ADLs/Self Care Home Management;Therapeutic activities;Therapeutic exercise;Patient/family education;Vestibular;Balance training;Neuromuscular re-education;Gait training    PT Next Visit Plan corner balance and habituation added to HEP.  Progress HEP as indicated.  Habituation to looking up, bending forwards, walking with head turns, balance reactions.    PT Home Exercise Plan x1 viewing    Consulted and Agree with Plan of Care Patient           Patient will benefit from skilled therapeutic intervention in order to improve the following deficits and impairments:  Decreased balance,Difficulty walking,Dizziness,Postural dysfunction,Decreased cognition  Visit Diagnosis: Dizziness and giddiness  Unsteadiness on feet     Problem List Patient Active Problem List   Diagnosis Date Noted  . Monoallelic mutation of  CHEK2 gene in female patient 02/27/2018  . Family history of genetic disease carrier   . Family history of breast cancer   . Family history of colon cancer   . History of multiple concussions 11/20/2017  . 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 Insomnia 02/17/2015  . Right shoulder pain 12/31/2014  . Episodic cluster headache, not intractable 12/02/2014  . 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 Photophobia of both eyes 05/04/2014  . Nausea with vomiting 05/04/2014  . Bipolar I disorder, most recent episode mixed ([place]) 04/18/2014  . Suicidal ideation 04/12/2014  . Injury of right shoulder and upper arm 02/17/2014  . Migraine with status migrainosus 01/08/2013  . Chronic migraine 05/08/2012  . Contact dermatitis 11/27/2011  . Generalized anxiety disorder  10/27/2011  . 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 Borderline personality disorder ([place]) 10/27/2011  . Right foot pain 09/28/2011  . 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 Malignant tumor of muscle ([place]) 09/02/2010  . Ganglion cyst 09/29/2009  . Pes planus 07/01/2008    DildayJenness [place], PT 04/02/2020, 2:04 PM  [facility] [address] [place], [place], [address] Phone: [phone]   Fax:  [phone]  Name: [name] MRN: [id number] Date of Birth: 1970-04-27

## 2020-04-05 ENCOUNTER — Encounter: Payer: Self-pay | Admitting: Physical Therapy

## 2020-04-05 ENCOUNTER — Other Ambulatory Visit: Payer: Self-pay

## 2020-04-05 ENCOUNTER — Ambulatory Visit: Payer: PPO | Admitting: Physical Therapy

## 2020-04-05 DIAGNOSIS — R2681 Unsteadiness on feet: Secondary | ICD-10-CM | POA: Diagnosis not present

## 2020-04-05 DIAGNOSIS — R42 Dizziness and giddiness: Secondary | ICD-10-CM

## 2020-04-06 ENCOUNTER — Ambulatory Visit: Payer: PPO | Admitting: Sports Medicine

## 2020-04-06 ENCOUNTER — Ambulatory Visit
Admission: RE | Admit: 2020-04-06 | Discharge: 2020-04-06 | Disposition: A | Discharge status: Home / Self Care | Payer: PPO | Source: Ambulatory Visit | Attending: Sports Medicine | Admitting: Sports Medicine

## 2020-04-06 VITALS — BP 129/79 | Ht 69.5 in | Wt 260.0 lb

## 2020-04-06 DIAGNOSIS — M79602 Pain in left arm: Secondary | ICD-10-CM

## 2020-04-06 DIAGNOSIS — M79632 Pain in left forearm: Secondary | ICD-10-CM | POA: Diagnosis not present

## 2020-04-06 DIAGNOSIS — M25522 Pain in left elbow: Secondary | ICD-10-CM | POA: Diagnosis not present

## 2020-04-06 NOTE — Therapy (Signed)
[facility] [address] [place], [place], [address] Phone: [phone]   Fax:  [phone]  Physical Therapy Treatment  Patient Details  Name: [name] MRN: [id number] Date of Birth: 07/24/1970 Referring Provider (PT): Ward Givens, NP   Encounter Date: 04/05/2020   PT End of Session - 04/06/20 1945    Visit Number 9    Number of Visits 13    Date for PT Re-Evaluation 04/30/20    Authorization Type [employer]    Authorization Time Period 03-01-20 - 05-29-20    PT Start Time 0801    PT Stop Time 0845    PT Time Calculation (min) 44 min    Activity Tolerance Patient tolerated treatment well    Behavior During Therapy [facility] for tasks assessed/performed           Past Medical History:  Diagnosis Date  . Achilles tendinitis   . ADHD (attention deficit hyperactivity disorder), inattentive type    Diagnosed as an adult after starting college  . Allergy   . Arthritis   . Bipolar I disorder 04/18/2014   most recent episode mixed  . Borderline personality disorder 10/27/2011  . Cataracts, bilateral   . Chronic pain disorder    due to several injuries affecting numerous areas of her body throughout the years  . 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 Eczema   . Episodic cluster headache, not intractable 12/02/2014  . Family history of genetic disease carrier   . Ganglion cyst 09/29/2009   left wrist (2 cyst)  . Generalized anxiety disorder 10/27/2011  . History of multiple concussions    October 2018 and September 2019  . Hyperprolactinemia   . Hypertension   . Insomnia 02/17/2015  . Lipoma   . 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 Migraine with status migrainosus 01/08/2013  . 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  Pes planus   . Photophobia of both eyes 05/04/2014    Past Surgical History:  Procedure Laterality Date  . ANKLE SURGERY  12/88   left   . chest nodule  1990?   rt chest wall nodule removal  . GANGLION CYST EXCISION  2011  . lipoma removal    . right bunioectomy    . SHOULDER SURGERY  01/13/2011   right, partial tear  . tumor resection left thigh      There were no vitals filed for this visit.                       Vestibular Treatment/Exercise - 04/06/20 0001      Vestibular Treatment/Exercise   Vestibular Treatment Provided Gaze      X1 Viewing Horizontal   Foot Position bil. stance - on floor - no UE support    Time --   30 secs   Reps 2    Comments patterned bakground      X1 Viewing Vertical   Foot Position bil. stance -    Time --   30 secs   Reps 2    Comments patterned background              Balance Exercises - 04/06/20 0001      Balance Exercises: Standing   Standing Eyes Opened Wide (BOA);Head turns;Foam/compliant surface;5 reps  horizontal and vertical   Standing Eyes Closed Wide (BOA);Head turns;Foam/compliant surface;5 reps   horizontal and vertical head turns   Rockerboard Anterior/posterior;Head turns;EO;EC;Other reps (comment);Intermittent UE support   20 reps total - 10 with EC, 10 with EO   Gait with Head Turns Forward;2 reps   25' - horizontal head turns   Marching Foam/compliant surface;Static;10 reps   horizontal head turns   Other Standing Exercises pt stood on blue mat - reaching down and up with 180 degree turns with CGA - 3 reps          Pt performed gaze stabilization exercise - standing with feet together on blue mat - made circles clockwise 5 reps and then Counterclockwise with ball 5 reps - tracking ball with eyes and head moving     PT Short Term Goals - 04/06/20 1949      PT SHORT TERM GOAL #1   Title Pt will improve gaze stabilization to </= 3 line difference for DVA.    Baseline 4 line difference on  03-01-20;  04-01-20  line 11/line 8    Time 4    Period Weeks    Status Achieved    Target Date 04/02/20      PT SHORT TERM GOAL #2   Title Increase FGA score by at least 4 points for improved balance with gait.    Baseline FGA score 18/30 (04-01-20)    Time 4    Period Weeks    Status On-going    Target Date 04/02/20      PT SHORT TERM GOAL #3   Title Pt will amb. 30' with horizontal head turns with minimal sway in path deviation for increased safety with environmental scanning with amb.    Baseline pt reports dizziness 3/10 with turning and stopping; met 04-01-20    Time 4    Period Weeks    Status Achieved    Target Date 04/02/20      PT SHORT TERM GOAL #4   Title Independent in HEP for balance and vestibular exercises.    Time 4    Period Weeks    Status Achieved    Target Date 04/02/20             PT Long Term Goals - 04/06/20 1950      PT LONG TERM GOAL #1   Title Pt will improve DVA to </= 2 line difference for improved gaze stabilization.    Baseline 4 line difference    Time 8    Period Weeks    Status New      PT LONG TERM GOAL #2   Title Improve FOTO score from 48/100 to 61/100 to demo improvement in dizziness.    Time 8    Period Weeks    Status New      PT LONG TERM GOAL #3   Title Increase FGA score by at least 8 points to increase safety with gait.    Time 8    Period Weeks    Status New      PT LONG TERM GOAL #4   Title Improve balance so pt able to perform Romberg test EC for at least 30 secs without LOB.    Baseline 15 secs with feet 4" apart    Time 8    Period Weeks    Status New      PT LONG TERM GOAL #5   Title Independent in updated HEP for balance and vestibular exs.  Time 8    Period Weeks    Status New                 Plan - 04/06/20 1946    Clinical Impression Statement Pt is progressing with maintaining balance on compliant surfaces and demonstrates improvement in maintaining balance with EC.  Pt tolerated x1 viewing  30 secs for 2 reps with both horizontal and vertical head turns.  Cont with POC.    Personal Factors and Comorbidities Behavior Pattern;Comorbidity 2;Time since onset of injury/illness/exacerbation;Profession;Past/Current Experience    Comorbidities h/o multiple concussions, migraines, cluster HA's, bipolar disorder    Stability/Clinical Decision Making Evolving/Moderate complexity    Rehab Potential Good    PT Frequency 2x / week    PT Duration 4 weeks   followed by 1x/week for 4 weeks   PT Treatment/Interventions ADLs/Self Care Home Management;Therapeutic activities;Therapeutic exercise;Patient/family education;Vestibular;Balance training;Neuromuscular re-education;Gait training    PT Next Visit Plan corner balance and habituation added to HEP.  Progress HEP as indicated.  Habituation to looking up, bending forwards, walking with head turns, balance reactions.    PT Home Exercise Plan x1 viewing    Consulted and Agree with Plan of Care Patient           Patient will benefit from skilled therapeutic intervention in order to improve the following deficits and impairments:  Decreased balance,Difficulty walking,Dizziness,Postural dysfunction,Decreased cognition  Visit Diagnosis: Unsteadiness on feet  Dizziness and giddiness     Problem List Patient Active Problem List   Diagnosis Date Noted  . 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 Family history of genetic disease carrier   . Family history of breast cancer   . Family history of colon cancer   . History of multiple concussions 11/20/2017  . 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 Insomnia 02/17/2015  . Right shoulder pain 12/31/2014  . Episodic cluster headache, not intractable 12/02/2014  . 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 Photophobia of both eyes 05/04/2014  . Nausea with vomiting 05/04/2014  . Bipolar I disorder, most recent episode mixed ([place])  04/18/2014  . Suicidal ideation 04/12/2014  . Injury of right shoulder and upper arm 02/17/2014  . Migraine with status migrainosus 01/08/2013  . Chronic migraine 05/08/2012  . Contact dermatitis 11/27/2011  . Generalized anxiety disorder 10/27/2011  . 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 Borderline personality disorder ([place]) 10/27/2011  . Right foot pain 09/28/2011  . 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 Malignant tumor of muscle ([place]) 09/02/2010  . Ganglion cyst 09/29/2009  . Pes planus 07/01/2008    Alda Lea, PT 04/06/2020, 7:52 PM  [facility] [address] [place], [place], [address] Phone: [phone]   Fax:  [phone]  Name: [name] MRN: [id number] Date of Birth: 1970/09/07

## 2020-04-07 ENCOUNTER — Other Ambulatory Visit: Payer: Self-pay

## 2020-04-07 DIAGNOSIS — M25552 Pain in left hip: Secondary | ICD-10-CM

## 2020-04-07 NOTE — Progress Notes (Signed)
   Subjective:    Patient ID: [name], female    DOB: [birth year], 50 y.o.   MRN: [id number]  HPI chief complaint: Left arm and elbow pain  [name] comes in today after having injured her left arm and elbow last Friday.  She fell from a height of about 1 foot directly onto her left side.  She is now having pain diffusely throughout the lateral aspect of the left elbow into the lateral forearm.  She noticed some swelling initially as well.  No pain in the shoulder.    Review of Systems    As above Objective:   Physical Exam  Developed, well nourished.  No acute distress  Left elbow: Patient is able to achieve full active range of motion but it is painful with full extension as well as pronation and supination.  There is no obvious effusion.  Mild soft tissue swelling.  No ecchymosis.  She is tender to palpation over the radial head.  No tenderness over the medial elbow.  Elbow is grossly stable to ligamentous exam.  Examination of the left forearm shows tenderness to palpation primarily along the radius diffusely.  Mild soft tissue swelling.  No tenderness along the ulna.  Full wrist range of motion.  Good pulses.  X-rays of the left elbow and forearm are unremarkable.  I see no obvious fracture.  No elbow effusion.      Assessment & Plan:   Left elbow and forearm pain secondary to contusion  Reassurance regarding x-rays.  Patient is provided with a sling to wear for comfort only.  She may use Tylenol 500 mg twice daily as needed for pain as well.  She may start to increase activity as tolerated and will follow-up for ongoing or recalcitrant issues.  Of note, we also discussed the results of the recent MRI of her right tib-fib.  There was no soft tissue mass seen in the posterior soft tissues of her right calf.  She does have findings consistent with a mild muscular strain of the medial head of the gastroc.  Reassurance is provided and she may use compression as needed.

## 2020-04-07 NOTE — Progress Notes (Signed)
Called pt and let her know that the x-ray of her hip from the other day shows a very mild amount of arthritis. If she would like, she can start PT for this.  Pt would like to do PT. Order placed.

## 2020-04-08 ENCOUNTER — Other Ambulatory Visit: Payer: Self-pay

## 2020-04-08 ENCOUNTER — Ambulatory Visit: Payer: PPO | Admitting: Physical Therapy

## 2020-04-08 DIAGNOSIS — R2681 Unsteadiness on feet: Secondary | ICD-10-CM | POA: Diagnosis not present

## 2020-04-08 DIAGNOSIS — R42 Dizziness and giddiness: Secondary | ICD-10-CM

## 2020-04-08 NOTE — Patient Instructions (Signed)
Hip Flexor Stretch    Lying on back near edge of bed, bend one leg, foot flat. Hang other leg over edge, relaxed, thigh resting entirely on bed for __1__ minutes. Repeat __1__ times. Do __1-2__ sessions per day. Advanced Exercise: Bend knee back keeping thigh in contact with bed. (this is the way we did in PT today - with knee flexed at 90 degrees)       Gastroc Stretch - Standing    Stand with uninvolved leg straight, heel on floor, toes pointed slightly inward. Lean into wall until stretch is felt in calf. Hold for _30__ seconds. Repeat on involved leg. Repeat _1__ times. Do _2__ times per day.  Use moist heat for 15 minutes before stretching (and after stretching if possible)

## 2020-04-09 NOTE — Therapy (Signed)
[facility] [address] [place], [place], [address] Phone: [phone]   Fax:  [phone]  Physical Therapy Treatment & 10th Visit Progress Note   Reporting Period:  03-01-20 - 04-08-20 See below for progress towards goals  Patient Details  Name: [name] MRN: [id number] Date of Birth: 1970/11/06 Referring Provider (PT): Ward Givens, NP   Encounter Date: 04/08/2020   PT End of Session - 04/08/20 0809    Visit Number 10    Number of Visits 13    Date for PT Re-Evaluation 04/30/20    Authorization Type [employer]    Authorization Time Period 03-01-20 - 05-29-20    PT Start Time 0802    PT Stop Time 0845    PT Time Calculation (min) 43 min    Activity Tolerance Patient tolerated treatment well    Behavior During Therapy [facility] for tasks assessed/performed           Past Medical History:  Diagnosis Date  . Achilles tendinitis   . ADHD (attention deficit hyperactivity disorder), inattentive type    Diagnosed as an adult after starting college  . Allergy   . Arthritis   . Bipolar I disorder 04/18/2014   most recent episode mixed  . Borderline personality disorder 10/27/2011  . Cataracts, bilateral   . Chronic pain disorder    due to several injuries affecting numerous areas of her body throughout the years  . 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 Eczema   . Episodic cluster headache, not intractable 12/02/2014  . Family history of genetic disease carrier   . Ganglion cyst 09/29/2009   left wrist (2 cyst)  . Generalized anxiety disorder 10/27/2011  . History of multiple concussions    October 2018 and September 2019  . Hyperprolactinemia   . Hypertension   . Insomnia 02/17/2015  . Lipoma   . 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 Migraine with status migrainosus  01/08/2013  . 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 Pes planus   . Photophobia of both eyes 05/04/2014    Past Surgical History:  Procedure Laterality Date  . ANKLE SURGERY  12/88   left   . chest nodule  1990?   rt chest wall nodule removal  . GANGLION CYST EXCISION  2011  . lipoma removal    . right bunioectomy    . SHOULDER SURGERY  01/13/2011   right, partial tear  . tumor resection left thigh      There were no vitals filed for this visit.   Subjective Assessment - 04/08/20 0806    Subjective Pt reports she had Lt arm x-rayed on Monday and found out it isn't fractured - has hematoma    Pertinent History h/o concussions - Oct. 2018 and Sept. 2019; headaches/migraines; memory problems, ADHD, bipolar disorder, photophobia    Patient Stated Goals "be able to stand up straight and not feel like I am teetering over"; improve balance    Currently in Pain? Yes    Pain Score 5     Pain Location Arm    Pain Orientation Left    Pain Descriptors / Indicators Aching;Sore;Dull    Pain Type Acute pain    Pain Onset In the past 7 days    Pain Frequency Constant  Multiple Pain Sites Yes    Pain Score 6    Pain Location Groin    Pain Orientation Left    Pain Descriptors / Indicators Aching    Pain Type Acute pain    Pain Onset More than a month ago    Pain Frequency Constant    Aggravating Factors  weight bearing    Pain Relieving Factors non-weight bearing positions                             [facility] Adult PT Treatment/Exercise - 04/09/20 0001      Exercises   Exercises Knee/Hip      Knee/Hip Exercises: Stretches   Active Hamstring Stretch Left;1 rep;30 seconds   Runner's stretch   Hip Flexor Stretch Left;1 rep;30 seconds   off side of mat with knee flexed at 90 degrees   Gastroc Stretch Left;1 rep;30 seconds   heel off edge of step          Vestibular Treatment/Exercise - 04/09/20 0001       Vestibular Treatment/Exercise   Vestibular Treatment Provided Gaze      X1 Viewing Horizontal   Foot Position bil. stance - on floor - no UE support    Time --   60 secs   Reps 1    Comments patterned background      X1 Viewing Vertical   Foot Position bil. stance -    Time --   60 secs   Reps 1    Comments patterned background              Balance Exercises - 04/09/20 0001      Balance Exercises: Standing   Standing Eyes Closed Wide (BOA);Head turns;Foam/compliant surface;5 reps   horizontal and vertical head turns   Rockerboard Anterior/posterior;Head turns;EO;EC;Other reps (comment);Intermittent UE support   20 reps total - 10 with EC, 10 with EO   Marching Foam/compliant surface;Static;10 reps   horizontal head turns   Other Standing Exercises pt stood on blue mat - reaching down and up with 180 degree turns with CGA - 3 reps             PT Education - 04/09/20 0750    Education Details added Lt hip flexor stretch to HEP due to c/o discomfort in Lt groin due to OA    Person(s) Educated Patient    Methods Explanation;Demonstration;Handout    Comprehension Verbalized understanding;Returned demonstration            PT Short Term Goals - 04/08/20 0809      PT SHORT TERM GOAL #1   Title Pt will improve gaze stabilization to </= 3 line difference for DVA.    Baseline 4 line difference on 03-01-20;  04-01-20  line 11/line 8    Time 4    Period Weeks    Status Achieved    Target Date 04/02/20      PT SHORT TERM GOAL #2   Title Increase FGA score by at least 4 points for improved balance with gait.    Baseline FGA score 18/30 (04-01-20)    Time 4    Period Weeks    Status On-going    Target Date 04/02/20      PT SHORT TERM GOAL #3   Title Pt will amb. 30' with horizontal head turns with minimal sway in path deviation for increased safety with environmental scanning with amb.    Baseline  pt reports dizziness 3/10 with turning and stopping; met 04-01-20     Time 4    Period Weeks    Status Achieved    Target Date 04/02/20      PT SHORT TERM GOAL #4   Title Independent in HEP for balance and vestibular exercises.    Time 4    Period Weeks    Status Achieved    Target Date 04/02/20             PT Long Term Goals - 04/08/20 5364      PT LONG TERM GOAL #1   Title Pt will improve DVA to </= 2 line difference for improved gaze stabilization.    Baseline 4 line difference    Time 8    Period Weeks    Status New      PT LONG TERM GOAL #2   Title Improve FOTO score from 48/100 to 61/100 to demo improvement in dizziness.    Time 8    Period Weeks    Status New      PT LONG TERM GOAL #3   Title Increase FGA score by at least 8 points to increase safety with gait.    Time 8    Period Weeks    Status New      PT LONG TERM GOAL #4   Title Improve balance so pt able to perform Romberg test EC for at least 30 secs without LOB.    Baseline 15 secs with feet 4" apart    Time 8    Period Weeks    Status New      PT LONG TERM GOAL #5   Title Independent in updated HEP for balance and vestibular exs.    Time 8    Period Weeks    Status New                 Plan - 04/08/20 0810    Clinical Impression Statement This 10th progress note covers dates 03-01-20 - 04-08-20.  Pt has met STG's #1, 3 and 4 with STG ongoing as FGA has just been completed with her score 18/30.    Pt reported she has been diagnosed with Lt hip OA by MD; reports more Lt groin discomfort since she fell last weekend while stepping down from low height onto a rock which rolled, causing her to lose balance and fall.  Stretches added to HEP per pt report.  Pt is now able to perform x1 viewing exercise for 60 secs horizontally and vertically with target on patterned background.  Cont with POC.    Personal Factors and Comorbidities Behavior Pattern;Comorbidity 2;Time since onset of injury/illness/exacerbation;Profession;Past/Current Experience    Comorbidities h/o  multiple concussions, migraines, cluster HA's, bipolar disorder    Stability/Clinical Decision Making Evolving/Moderate complexity    Rehab Potential Good    PT Frequency 2x / week    PT Duration 4 weeks   followed by 1x/week for 4 weeks   PT Treatment/Interventions ADLs/Self Care Home Management;Therapeutic activities;Therapeutic exercise;Patient/family education;Vestibular;Balance training;Neuromuscular re-education;Gait training    PT Next Visit Plan corner balance and habituation added to HEP.  Progress HEP as indicated.  Habituation to looking up, bending forwards, walking with head turns, balance reactions.    PT Home Exercise Plan x1 viewing    Consulted and Agree with Plan of Care Patient           Patient will benefit from skilled therapeutic intervention in order to improve the  following deficits and impairments:  Decreased balance,Difficulty walking,Dizziness,Postural dysfunction,Decreased cognition  Visit Diagnosis: Unsteadiness on feet  Dizziness and giddiness     Problem List Patient Active Problem List   Diagnosis Date Noted  . 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 Family history of genetic disease carrier   . Family history of breast cancer   . Family history of colon cancer   . History of multiple concussions 11/20/2017  . 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 Insomnia 02/17/2015  . Right shoulder pain 12/31/2014  . Episodic cluster headache, not intractable 12/02/2014  . 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 Photophobia of both eyes 05/04/2014  . Nausea with vomiting 05/04/2014  . Bipolar I disorder, most recent episode mixed ([place]) 04/18/2014  . Suicidal ideation 04/12/2014  . Injury of right shoulder and upper arm 02/17/2014  . Migraine with status migrainosus 01/08/2013  . Chronic migraine 05/08/2012  . Contact dermatitis 11/27/2011  . Generalized anxiety disorder 10/27/2011   . 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 Borderline personality disorder ([place]) 10/27/2011  . Right foot pain 09/28/2011  . 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 Malignant tumor of muscle ([place]) 09/02/2010  . Ganglion cyst 09/29/2009  . Pes planus 07/01/2008    Alda Lea, PT 04/09/2020, 8:09 AM  [facility] [address] [place], [place], [address] Phone: [phone]   Fax:  [phone]  Name: LEIA COLETTI MRN: [id number] Date of Birth: [birth year]

## 2020-04-10 ENCOUNTER — Other Ambulatory Visit: Payer: PPO

## 2020-04-12 ENCOUNTER — Ambulatory Visit: Payer: PPO | Admitting: Physical Therapy

## 2020-04-12 ENCOUNTER — Other Ambulatory Visit: Payer: Self-pay

## 2020-04-12 ENCOUNTER — Encounter: Payer: Self-pay | Admitting: Physical Therapy

## 2020-04-12 DIAGNOSIS — R42 Dizziness and giddiness: Secondary | ICD-10-CM

## 2020-04-12 DIAGNOSIS — H43813 Vitreous degeneration, bilateral: Secondary | ICD-10-CM | POA: Diagnosis not present

## 2020-04-12 DIAGNOSIS — H524 Presbyopia: Secondary | ICD-10-CM | POA: Diagnosis not present

## 2020-04-12 DIAGNOSIS — R2681 Unsteadiness on feet: Secondary | ICD-10-CM | POA: Diagnosis not present

## 2020-04-12 DIAGNOSIS — H31002 Unspecified chorioretinal scars, left eye: Secondary | ICD-10-CM | POA: Diagnosis not present

## 2020-04-12 DIAGNOSIS — H04123 Dry eye syndrome of bilateral lacrimal glands: Secondary | ICD-10-CM | POA: Diagnosis not present

## 2020-04-13 NOTE — Therapy (Signed)
[facility] [address] [place], [place], [address] Phone: [phone]   Fax:  [phone]  Physical Therapy Treatment  Patient Details  Name: [name] MRN: [id number] Date of Birth: [birth year] Referring Provider (PT): Ward Givens, NP   Encounter Date: 04/12/2020   PT End of Session - 04/13/20 1232    Visit Number 11    Number of Visits 13    Date for PT Re-Evaluation 04/30/20    Authorization Type [employer]    Authorization Time Period 03-01-20 - 05-29-20    PT Start Time 0801    PT Stop Time 0845    PT Time Calculation (min) 44 min    Activity Tolerance Patient tolerated treatment well    Behavior During Therapy [facility] for tasks assessed/performed           Past Medical History:  Diagnosis Date  . Achilles tendinitis   . ADHD (attention deficit hyperactivity disorder), inattentive type    Diagnosed as an adult after starting college  . Allergy   . Arthritis   . Bipolar I disorder 04/18/2014   most recent episode mixed  . Borderline personality disorder 10/27/2011  . Cataracts, bilateral   . Chronic pain disorder    due to several injuries affecting numerous areas of her body throughout the years  . 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 Eczema   . Episodic cluster headache, not intractable 12/02/2014  . Family history of genetic disease carrier   . Ganglion cyst 09/29/2009   left wrist (2 cyst)  . Generalized anxiety disorder 10/27/2011  . History of multiple concussions    October 2018 and September 2019  . Hyperprolactinemia   . Hypertension   . Insomnia 02/17/2015  . Lipoma   . 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 Migraine with status migrainosus 01/08/2013  . 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 Pes planus   . Photophobia of both eyes 05/04/2014    Past Surgical History:  Procedure Laterality Date  . ANKLE SURGERY  12/88   left   . chest nodule  1990?   rt chest wall nodule removal  . GANGLION CYST EXCISION  2011  . lipoma removal    . right bunioectomy    . SHOULDER SURGERY  01/13/2011   right, partial tear  . tumor resection left thigh      There were no vitals filed for this visit.   Subjective Assessment - 04/12/20 0805    Subjective Pt reports she slipped on an icy patch yesterday morning when she got out of car but held onto car and did not fall    Pertinent History h/o concussions - Oct. 2018 and Sept. 2019; headaches/migraines; memory problems, ADHD, bipolar disorder, photophobia    Patient Stated Goals "be able to stand up straight and not feel like I am teetering over"; improve balance    Currently in Pain? Yes    Pain Score 3     Pain Location Arm    Pain Orientation Left    Pain Descriptors / Indicators Aching    Pain Type Acute pain    Pain Onset 1 to 4 weeks ago    Pain Frequency Intermittent    Pain Onset More than a month ago  [place] Adult PT Treatment/Exercise - 04/13/20 0001      Knee/Hip Exercises: Stretches   Active Hamstring Stretch Left;1 rep;30 seconds   Runner's stretch   Hip Flexor Stretch Left;1 rep;30 seconds   off side of mat with knee flexed at 90 degrees   Gastroc Stretch Left;1 rep;30 seconds   heel off edge of step          Vestibular Treatment/Exercise - 04/13/20 0001      Vestibular Treatment/Exercise   Vestibular Treatment Provided Gaze      X1 Viewing Horizontal   Foot Position bil. stance - on floor - no UE support    Time --   60 secs   Reps 1    Comments patterned background      X1 Viewing Vertical   Foot Position bil. stance -    Time --   60 secs   Reps 1    Comments patterned background           Standing Lt hip flexion with 3# weight 10 reps - knee  flexed   Balance Exercises - 04/13/20 0001      Balance Exercises: Standing   Standing Eyes Opened Wide (BOA);Head turns;Foam/compliant surface;5 reps   horizontal & vertical   Rockerboard Anterior/posterior;EO;EC;10 reps;Intermittent UE support   10 reps EO and 10 reps EC   Turning Right;Left   2 reps to each side   Marching Foam/compliant surface;Static;10 reps   horizontal head turns   Other Standing Exercises pt stood on blue mat - reaching down and up with 180 degree turns with CGA - 3 reps               PT Short Term Goals - 04/13/20 1237      PT SHORT TERM GOAL #1   Title Pt will improve gaze stabilization to </= 3 line difference for DVA.    Baseline 4 line difference on 03-01-20;  04-01-20  line 11/line 8    Time 4    Period Weeks    Status Achieved    Target Date 04/02/20      PT SHORT TERM GOAL #2   Title Increase FGA score by at least 4 points for improved balance with gait.    Baseline FGA score 18/30 (04-01-20)    Time 4    Period Weeks    Status On-going    Target Date 04/02/20      PT SHORT TERM GOAL #3   Title Pt will amb. 30' with horizontal head turns with minimal sway in path deviation for increased safety with environmental scanning with amb.    Baseline pt reports dizziness 3/10 with turning and stopping; met 04-01-20    Time 4    Period Weeks    Status Achieved    Target Date 04/02/20      PT SHORT TERM GOAL #4   Title Independent in HEP for balance and vestibular exercises.    Time 4    Period Weeks    Status Achieved    Target Date 04/02/20             PT Long Term Goals - 04/12/20 0836      PT LONG TERM GOAL #1   Title Pt will improve DVA to </= 2 line difference for improved gaze stabilization.    Baseline 4 line difference    Time 8    Period Weeks    Status New      PT LONG  TERM GOAL #2   Title Improve FOTO score from 48/100 to 61/100 to demo improvement in dizziness.    Time 8    Period Weeks    Status New      PT LONG TERM  GOAL #3   Title Increase FGA score by at least 8 points to increase safety with gait.    Time 8    Period Weeks    Status New      PT LONG TERM GOAL #4   Title Improve balance so pt able to perform Romberg test EC for at least 30 secs without LOB.    Baseline 15 secs with feet 4" apart    Time 8    Period Weeks    Status New      PT LONG TERM GOAL #5   Title Independent in updated HEP for balance and vestibular exs.    Time 8    Period Weeks    Status New                 Plan - 04/13/20 1235    Clinical Impression Statement Pt is progressing well towards goals; continues to have mild unsteadiness with standing with EC on compliant surface, indicative of decr. vestibular input in maintaining balance.  Pt improving with gaze stabilization exercises with minimal c/o dizziness with head and eye movements.  Cont with POC.    Personal Factors and Comorbidities Behavior Pattern;Comorbidity 2;Time since onset of injury/illness/exacerbation;Profession;Past/Current Experience    Comorbidities h/o multiple concussions, migraines, cluster HA's, bipolar disorder    Stability/Clinical Decision Making Evolving/Moderate complexity    Rehab Potential Good    PT Frequency 2x / week    PT Duration 4 weeks   followed by 1x/week for 4 weeks   PT Treatment/Interventions ADLs/Self Care Home Management;Therapeutic activities;Therapeutic exercise;Patient/family education;Vestibular;Balance training;Neuromuscular re-education;Gait training    PT Next Visit Plan corner balance and habituation added to HEP.  Progress HEP as indicated.  Habituation to looking up, bending forwards, walking with head turns, balance reactions.    PT Home Exercise Plan x1 viewing    Consulted and Agree with Plan of Care Patient           Patient will benefit from skilled therapeutic intervention in order to improve the following deficits and impairments:  Decreased balance,Difficulty walking,Dizziness,Postural  dysfunction,Decreased cognition  Visit Diagnosis: Unsteadiness on feet  Dizziness and giddiness     Problem List Patient Active Problem List   Diagnosis Date Noted  . 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 Family history of genetic disease carrier   . Family history of breast cancer   . Family history of colon cancer   . History of multiple concussions 11/20/2017  . 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 Insomnia 02/17/2015  . Right shoulder pain 12/31/2014  . Episodic cluster headache, not intractable 12/02/2014  . 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 Photophobia of both eyes 05/04/2014  . Nausea with vomiting 05/04/2014  . Bipolar I disorder, most recent episode mixed ([place]) 04/18/2014  . Suicidal ideation 04/12/2014  . Injury of right shoulder and upper arm 02/17/2014  . Migraine with status migrainosus 01/08/2013  . Chronic migraine 05/08/2012  . Contact dermatitis 11/27/2011  . Generalized anxiety disorder 10/27/2011  . 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 Borderline personality disorder ([place]) 10/27/2011  . Right foot pain 09/28/2011  . 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  Malignant tumor of muscle ([place]) 09/02/2010  . Ganglion cyst 09/29/2009  . Pes planus 07/01/2008    Alda Lea, PT 04/13/2020, 12:38 PM  [facility] [address] [place], [place], [address] Phone: [phone]   Fax:  [phone]  Name: ALEXEA BLASE MRN: [id number] Date of Birth: 1970/09/30

## 2020-04-19 ENCOUNTER — Encounter: Payer: Self-pay | Admitting: Physical Therapy

## 2020-04-19 ENCOUNTER — Other Ambulatory Visit: Payer: Self-pay

## 2020-04-19 ENCOUNTER — Ambulatory Visit: Payer: PPO | Admitting: Physical Therapy

## 2020-04-19 DIAGNOSIS — R2681 Unsteadiness on feet: Secondary | ICD-10-CM

## 2020-04-19 DIAGNOSIS — R42 Dizziness and giddiness: Secondary | ICD-10-CM

## 2020-04-19 DIAGNOSIS — M6281 Muscle weakness (generalized): Secondary | ICD-10-CM

## 2020-04-20 NOTE — Therapy (Signed)
[facility] [address] [place], [place], [address] Phone: [phone]   Fax:  [phone]  Physical Therapy Treatment  Patient Details  Name: [name] MRN: [id number] Date of Birth: 06-Oct-1970 Referring Provider (PT): Ward Givens, NP   Encounter Date: 04/19/2020   PT End of Session - 04/20/20 1928    Visit Number 12    Number of Visits 13    Date for PT Re-Evaluation 04/30/20    Authorization Type [employer]    Authorization Time Period 03-01-20 - 05-29-20    PT Start Time 0802    PT Stop Time 0846    PT Time Calculation (min) 44 min    Activity Tolerance Patient tolerated treatment well    Behavior During Therapy [facility] for tasks assessed/performed           Past Medical History:  Diagnosis Date  . Achilles tendinitis   . ADHD (attention deficit hyperactivity disorder), inattentive type    Diagnosed as an adult after starting college  . Allergy   . Arthritis   . Bipolar I disorder 04/18/2014   most recent episode mixed  . Borderline personality disorder 10/27/2011  . Cataracts, bilateral   . Chronic pain disorder    due to several injuries affecting numerous areas of her body throughout the years  . 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 Eczema   . Episodic cluster headache, not intractable 12/02/2014  . Family history of genetic disease carrier   . Ganglion cyst 09/29/2009   left wrist (2 cyst)  . Generalized anxiety disorder 10/27/2011  . History of multiple concussions    October 2018 and September 2019  . Hyperprolactinemia   . Hypertension   . Insomnia 02/17/2015  . Lipoma   . 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 Migraine with status migrainosus 01/08/2013  . 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 Pes planus   . Photophobia of both eyes 05/04/2014    Past Surgical History:  Procedure Laterality Date  . ANKLE SURGERY  12/88   left   . chest nodule  1990?   rt chest wall nodule removal  . GANGLION CYST EXCISION  2011  . lipoma removal    . right bunioectomy    . SHOULDER SURGERY  01/13/2011   right, partial tear  . tumor resection left thigh      There were no vitals filed for this visit.                      [place] Adult PT Treatment/Exercise - 04/20/20 0001      Ambulation/Gait   Ambulation/Gait Yes    Ambulation Distance (Feet) 50 Feet    Assistive device None    Gait Pattern Within Functional Limits    Gait Comments with horizontal head turns      Exercises   Exercises Knee/Hip      Knee/Hip Exercises: Stretches   Active Hamstring Stretch Left;1 rep;30 seconds   Runner's stretch   Hip Flexor Stretch Left;1 rep;30 seconds   off side of mat with knee flexed at 90 degrees   Gastroc Stretch Left;1 rep;30 seconds   heel off edge of step              Balance Exercises - 04/20/20  0001      Balance Exercises: Standing   Standing Eyes Closed Wide (BOA);Head turns;Foam/compliant surface;5 reps   horizontal and vertical   Rockerboard Anterior/posterior;EO;EC;10 reps;Intermittent UE support   10 reps EO and 10 reps EC   Turning Right;Left;Other (comment)   2 reps to each side   Marching Foam/compliant surface;Static;10 reps   horizontal head turns   Other Standing Exercises pt performed gaze stabilization exercise - standing on [employer] - making circles clockwise and counterclockwise with ball 5 reps each    Other Standing Exercises Comments Pt performed forwards amb. tossing ball straight up - approx. 40' and then on Rt side/Lt side for 30' for increased balance with turning               PT Short Term Goals - 04/19/20 0844      PT SHORT TERM GOAL #1   Title Pt will improve gaze stabilization to </= 3 line difference for DVA.    Baseline  4 line difference on 03-01-20;  04-01-20  line 11/line 8    Time 4    Period Weeks    Status Achieved    Target Date 04/02/20      PT SHORT TERM GOAL #2   Title Increase FGA score by at least 4 points for improved balance with gait.    Baseline FGA score 18/30 (04-01-20)    Time 4    Period Weeks    Status On-going    Target Date 04/02/20      PT SHORT TERM GOAL #3   Title Pt will amb. 30' with horizontal head turns with minimal sway in path deviation for increased safety with environmental scanning with amb.    Baseline pt reports dizziness 3/10 with turning and stopping; met 04-01-20    Time 4    Period Weeks    Status Achieved    Target Date 04/02/20      PT SHORT TERM GOAL #4   Title Independent in HEP for balance and vestibular exercises.    Time 4    Period Weeks    Status Achieved    Target Date 04/02/20             PT Long Term Goals - 04/19/20 0845      PT LONG TERM GOAL #1   Title Pt will improve DVA to </= 2 line difference for improved gaze stabilization.    Baseline 4 line difference    Time 8    Period Weeks    Status New      PT LONG TERM GOAL #2   Title Improve FOTO score from 48/100 to 61/100 to demo improvement in dizziness.    Time 8    Period Weeks    Status New      PT LONG TERM GOAL #3   Title Increase FGA score by at least 8 points to increase safety with gait.    Time 8    Period Weeks    Status New      PT LONG TERM GOAL #4   Title Improve balance so pt able to perform Romberg test EC for at least 30 secs without LOB.    Baseline 15 secs with feet 4" apart    Time 8    Period Weeks    Status New      PT LONG TERM GOAL #5   Title Independent in updated HEP for balance and vestibular exs.    Time 8  Period Weeks    Status New                 Plan - 04/20/20 1928    Clinical Impression Statement Pt had mild unsteadiness with standing with EC on compliant surfaces and also mild unsteadiness with EC with head turns on  compliant surfaces.  Pt is progressing well towards goals.  Cont with POC.    Personal Factors and Comorbidities Behavior Pattern;Comorbidity 2;Time since onset of injury/illness/exacerbation;Profession;Past/Current Experience    Comorbidities h/o multiple concussions, migraines, cluster HA's, bipolar disorder    Stability/Clinical Decision Making Evolving/Moderate complexity    Rehab Potential Good    PT Frequency 2x / week    PT Duration 4 weeks   followed by 1x/week for 4 weeks   PT Treatment/Interventions ADLs/Self Care Home Management;Therapeutic activities;Therapeutic exercise;Patient/family education;Vestibular;Balance training;Neuromuscular re-education;Gait training    PT Next Visit Plan corner balance and habituation added to HEP.  Progress HEP as indicated.  Habituation to looking up, bending forwards, walking with head turns, balance reactions.    PT Home Exercise Plan x1 viewing    Consulted and Agree with Plan of Care Patient           Patient will benefit from skilled therapeutic intervention in order to improve the following deficits and impairments:  Decreased balance,Difficulty walking,Dizziness,Postural dysfunction,Decreased cognition  Visit Diagnosis: Unsteadiness on feet  Dizziness and giddiness  Muscle weakness (generalized)     Problem List Patient Active Problem List   Diagnosis Date Noted  . 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 Family history of genetic disease carrier   . Family history of breast cancer   . Family history of colon cancer   . History of multiple concussions 11/20/2017  . 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 Insomnia 02/17/2015  . Right shoulder pain 12/31/2014  . Episodic cluster headache, not intractable 12/02/2014  . 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 Photophobia of both eyes 05/04/2014  . Nausea with vomiting 05/04/2014  . Bipolar I disorder, most  recent episode mixed ([place]) 04/18/2014  . Suicidal ideation 04/12/2014  . Injury of right shoulder and upper arm 02/17/2014  . Migraine with status migrainosus 01/08/2013  . Chronic migraine 05/08/2012  . Contact dermatitis 11/27/2011  . Generalized anxiety disorder 10/27/2011  . 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 Borderline personality disorder ([place]) 10/27/2011  . Right foot pain 09/28/2011  . 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 Malignant tumor of muscle ([name]) 09/02/2010  . Ganglion cyst 09/29/2009  . Pes planus 07/01/2008    Alda Lea, PT 04/20/2020, 7:39 PM  [facility] [address] [place], [place], [address] Phone: [phone]   Fax:  [phone]  Name: [name] MRN: [id number] Date of Birth: [birth year]

## 2020-04-21 ENCOUNTER — Telehealth: Payer: Self-pay | Admitting: Adult Health

## 2020-04-21 NOTE — Telephone Encounter (Signed)
Noted.  Advised patient to rest today and hopefully her symptoms improve by tomorrow.  She should make sure her physical therapist is aware of her symptoms as well.

## 2020-04-21 NOTE — Telephone Encounter (Signed)
I called pt.  She has been  2x wk for vest rehab.  This Monday is first week of once week.  From note she was doing ok.  Pt states last 4 days been worse.  [place], 04-16-20 tripped over stump landed R elbow.  No broken bones.  She states that she walks feels wobbly all over the place. Off balance.  Feels like she cant walk a straight line. Is more stressed at work, but does not feel like this an issue. Worse mon and tues.   Called out work today.  I relayed that will let MM/NP know.

## 2020-04-21 NOTE — Telephone Encounter (Signed)
Called pt and relayed that per MM/NP rest today and let therapist know of her symptoms.  She appreciated call back and will do.

## 2020-04-21 NOTE — Telephone Encounter (Signed)
Pt states her Vertigo is progressively getting worse in the last 4 days and she is needing to discuss with RN. Please advise.

## 2020-04-26 ENCOUNTER — Ambulatory Visit: Payer: PPO | Admitting: Physical Therapy

## 2020-04-26 ENCOUNTER — Encounter: Payer: Self-pay | Admitting: Physical Therapy

## 2020-04-26 ENCOUNTER — Other Ambulatory Visit: Payer: Self-pay

## 2020-04-26 DIAGNOSIS — R2681 Unsteadiness on feet: Secondary | ICD-10-CM

## 2020-04-26 DIAGNOSIS — R42 Dizziness and giddiness: Secondary | ICD-10-CM

## 2020-04-27 NOTE — Therapy (Signed)
[facility] [address] [place], [place], [address] Phone: [phone]   Fax:  [phone]  Physical Therapy Treatment  Patient Details  Name: [name] MRN: [id number] Date of Birth: [birth year] Referring Provider (PT): Ward Givens, NP   Encounter Date: 04/26/2020   PT End of Session - 04/27/20 2207    Visit Number 13    Number of Visits 13    Date for PT Re-Evaluation 04/30/20    Authorization Type [employer]    Authorization Time Period 03-01-20 - 05-29-20    PT Start Time 0801    PT Stop Time 0846    PT Time Calculation (min) 45 min    Activity Tolerance Patient tolerated treatment well    Behavior During Therapy [facility] for tasks assessed/performed           Past Medical History:  Diagnosis Date  . Achilles tendinitis   . ADHD (attention deficit hyperactivity disorder), inattentive type    Diagnosed as an adult after starting college  . Allergy   . Arthritis   . Bipolar I disorder 04/18/2014   most recent episode mixed  . Borderline personality disorder 10/27/2011  . Cataracts, bilateral   . Chronic pain disorder    due to several injuries affecting numerous areas of her body throughout the years  . 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 Eczema   . Episodic cluster headache, not intractable 12/02/2014  . Family history of genetic disease carrier   . Ganglion cyst 09/29/2009   left wrist (2 cyst)  . Generalized anxiety disorder 10/27/2011  . History of multiple concussions    October 2018 and September 2019  . Hyperprolactinemia   . Hypertension   . Insomnia 02/17/2015  . Lipoma   . 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 Migraine with status migrainosus 01/08/2013  . 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 Pes planus   . Photophobia of both eyes 05/04/2014    Past Surgical History:  Procedure Laterality Date  . ANKLE SURGERY  12/88   left   . chest nodule  1990?   rt chest wall nodule removal  . GANGLION CYST EXCISION  2011  . lipoma removal    . right bunioectomy    . SHOULDER SURGERY  01/13/2011   right, partial tear  . tumor resection left thigh      There were no vitals filed for this visit.                                PT Short Term Goals - 04/27/20 2208      PT SHORT TERM GOAL #1   Title Pt will improve gaze stabilization to </= 3 line difference for DVA.    Baseline 4 line difference on 03-01-20;  04-01-20  line 11/line 8    Time 4    Period Weeks    Status Achieved    Target Date 04/02/20      PT SHORT TERM GOAL #2   Title Increase FGA score by at least 4 points for improved balance with gait.    Baseline FGA score 18/30 (04-01-20)    Time 4    Period Weeks    Status On-going  Target Date 04/02/20      PT SHORT TERM GOAL #3   Title Pt will amb. 30' with horizontal head turns with minimal sway in path deviation for increased safety with environmental scanning with amb.    Baseline pt reports dizziness 3/10 with turning and stopping; met 04-01-20    Time 4    Period Weeks    Status Achieved    Target Date 04/02/20      PT SHORT TERM GOAL #4   Title Independent in HEP for balance and vestibular exercises.    Time 4    Period Weeks    Status Achieved    Target Date 04/02/20             PT Long Term Goals - 04/27/20 2208      PT LONG TERM GOAL #1   Title Pt will improve DVA to </= 2 line difference for improved gaze stabilization.    Baseline 4 line difference; met with exception of 2 line letters - line 11 static; line 9 dynamic - 04-26-20    Time 8    Period Weeks    Status Achieved      PT LONG TERM GOAL #2   Title Improve FOTO score from 48/100 to 61/100 to demo improvement in dizziness.    Time 8    Period Weeks     Status On-going      PT LONG TERM GOAL #3   Title Increase FGA score by at least 8 points to increase safety with gait.    Baseline score 18;  score 26/30    Time 8    Period Weeks    Status New      PT LONG TERM GOAL #4   Title Improve balance so pt able to perform Romberg test EC for at least 30 secs without LOB.    Baseline 15 secs with feet 4" apart;   04-26-20 - achieved with mild unsteadiness    Time 8    Period Weeks    Status Achieved      PT LONG TERM GOAL #5   Title Independent in updated HEP for balance and vestibular exs.    Time 8    Period Weeks    Status Achieved                 Plan - 04/27/20 2208    Personal Factors and Comorbidities Behavior Pattern;Comorbidity 2;Time since onset of injury/illness/exacerbation;Profession;Past/Current Experience    Comorbidities h/o multiple concussions, migraines, cluster HA's, bipolar disorder    Stability/Clinical Decision Making Evolving/Moderate complexity    Rehab Potential Good    PT Frequency 2x / week    PT Duration 4 weeks   followed by 1x/week for 4 weeks   PT Treatment/Interventions ADLs/Self Care Home Management;Therapeutic activities;Therapeutic exercise;Patient/family education;Vestibular;Balance training;Neuromuscular re-education;Gait training    PT Next Visit Plan corner balance and habituation added to HEP.  Progress HEP as indicated.  Habituation to looking up, bending forwards, walking with head turns, balance reactions.    PT Home Exercise Plan x1 viewing    Consulted and Agree with Plan of Care Patient           Patient will benefit from skilled therapeutic intervention in order to improve the following deficits and impairments:  Decreased balance,Difficulty walking,Dizziness,Postural dysfunction,Decreased cognition  Visit Diagnosis: Unsteadiness on feet  Dizziness and giddiness     Problem List Patient Active Problem List   Diagnosis Date Noted  . Monoallelic mutation  of CHEK2  gene in female patient 02/27/2018  . Family history of genetic disease carrier   . Family history of breast cancer   . Family history of colon cancer   . History of multiple concussions 11/20/2017  . 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 Insomnia 02/17/2015  . Right shoulder pain 12/31/2014  . Episodic cluster headache, not intractable 12/02/2014  . 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 Photophobia of both eyes 05/04/2014  . Nausea with vomiting 05/04/2014  . Bipolar I disorder, most recent episode mixed ([place]) 04/18/2014  . Suicidal ideation 04/12/2014  . Injury of right shoulder and upper arm 02/17/2014  . Migraine with status migrainosus 01/08/2013  . Chronic migraine 05/08/2012  . Contact dermatitis 11/27/2011  . Generalized anxiety disorder 10/27/2011  . 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 Borderline personality disorder ([place]) 10/27/2011  . Right foot pain 09/28/2011  . 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 Malignant tumor of muscle ([place]) 09/02/2010  . Ganglion cyst 09/29/2009  . Pes planus 07/01/2008    Alda Lea, PT 04/27/2020, 10:09 PM  [facility] [address] [place], [place], [address] Phone: [phone]   Fax:  [phone]  Name: [name] MRN: [id number] Date of Birth: 09-23-1970

## 2020-05-03 ENCOUNTER — Ambulatory Visit: Payer: PPO | Attending: Adult Health | Admitting: Physical Therapy

## 2020-05-03 ENCOUNTER — Encounter: Payer: Self-pay | Admitting: Physical Therapy

## 2020-05-03 ENCOUNTER — Other Ambulatory Visit: Payer: Self-pay

## 2020-05-03 DIAGNOSIS — R2681 Unsteadiness on feet: Secondary | ICD-10-CM | POA: Diagnosis not present

## 2020-05-03 DIAGNOSIS — R42 Dizziness and giddiness: Secondary | ICD-10-CM

## 2020-05-04 ENCOUNTER — Other Ambulatory Visit: Payer: Self-pay | Admitting: Family Medicine

## 2020-05-04 DIAGNOSIS — Z1231 Encounter for screening mammogram for malignant neoplasm of breast: Secondary | ICD-10-CM

## 2020-05-04 NOTE — Therapy (Signed)
[facility] [address] [place], [place], [address] Phone: [phone]   Fax:  [phone]  Physical Therapy Treatment  Patient Details  Name: [name] MRN: [id number] Date of Birth: January 17, 1971 Referring Provider (PT): Ward Givens, NP   Encounter Date: 05/03/2020   PT End of Session - 05/04/20 1915    Visit Number 14    Number of Visits 15    Date for PT Re-Evaluation 04/30/20    Authorization Type [employer]    Authorization Time Period 03-01-20 - 05-29-20    PT Start Time 0850    PT Stop Time 0933    PT Time Calculation (min) 43 min    Activity Tolerance Patient tolerated treatment well    Behavior During Therapy [facility] for tasks assessed/performed           Past Medical History:  Diagnosis Date  . Achilles tendinitis   . ADHD (attention deficit hyperactivity disorder), inattentive type    Diagnosed as an adult after starting college  . Allergy   . Arthritis   . Bipolar I disorder 04/18/2014   most recent episode mixed  . Borderline personality disorder 10/27/2011  . Cataracts, bilateral   . Chronic pain disorder    due to several injuries affecting numerous areas of her body throughout the years  . 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 Eczema   . Episodic cluster headache, not intractable 12/02/2014  . Family history of genetic disease carrier   . Ganglion cyst 09/29/2009   left wrist (2 cyst)  . Generalized anxiety disorder 10/27/2011  . History of multiple concussions    October 2018 and September 2019  . Hyperprolactinemia   . Hypertension   . Insomnia 02/17/2015  . Lipoma   . 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 Migraine with status migrainosus 01/08/2013  . 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 Pes planus   . Photophobia of both eyes 05/04/2014    Past Surgical History:  Procedure Laterality Date  . ANKLE SURGERY  12/88   left   . chest nodule  1990?   rt chest wall nodule removal  . GANGLION CYST EXCISION  2011  . lipoma removal    . right bunioectomy    . SHOULDER SURGERY  01/13/2011   right, partial tear  . tumor resection left thigh      There were no vitals filed for this visit.                      [facility] Adult PT Treatment/Exercise - 05/04/20 0001      High Level Balance   High Level Balance Activities Backward walking;Direction changes;Sudden stops;Head turns               Balance Exercises - 05/04/20 0001      Balance Exercises: Standing   Standing Eyes Closed Wide (BOA);Foam/compliant surface;Head turns;5 reps   horizontal and vertical head turns   Rockerboard Anterior/posterior;EO;EC;10 reps;Intermittent UE support   10 reps EO and 10 reps EC   Marching Foam/compliant surface;Static;10 reps   horizontal head turns   Other Standing Exercises pt stood on blue mat - reaching down and up with 180 degree turns with CGA - 3 reps    Other Standing Exercises Comments Pt  performed forwards amb. tossing ball straight up - approx. 28' and then on Rt side/Lt side for 30' for increased balance with turning               PT Short Term Goals - 05/04/20 1918      PT SHORT TERM GOAL #1   Title Pt will improve gaze stabilization to </= 3 line difference for DVA.    Baseline 4 line difference on 03-01-20;  04-01-20  line 11/line 8    Time 4    Period Weeks    Status Achieved    Target Date 04/02/20      PT SHORT TERM GOAL #2   Title Increase FGA score by at least 4 points for improved balance with gait.    Baseline FGA score 18/30 (04-01-20)    Time 4    Period Weeks    Status On-going    Target Date 04/02/20      PT SHORT TERM GOAL #3   Title Pt will amb. 30' with horizontal head turns with minimal sway in path deviation for  increased safety with environmental scanning with amb.    Baseline pt reports dizziness 3/10 with turning and stopping; met 04-01-20    Time 4    Period Weeks    Status Achieved    Target Date 04/02/20      PT SHORT TERM GOAL #4   Title Independent in HEP for balance and vestibular exercises.    Time 4    Period Weeks    Status Achieved    Target Date 04/02/20             PT Long Term Goals - 05/04/20 1918      PT LONG TERM GOAL #1   Title Pt will improve DVA to </= 2 line difference for improved gaze stabilization.    Baseline 4 line difference; met with exception of 2 line letters - line 11 static; line 9 dynamic - 04-26-20    Time 8    Period Weeks    Status Achieved      PT LONG TERM GOAL #2   Title Improve FOTO score from 48/100 to 61/100 to demo improvement in dizziness.    Time 8    Period Weeks    Status On-going      PT LONG TERM GOAL #3   Title Increase FGA score by at least 8 points to increase safety with gait.    Baseline score 18;  score 26/30    Time 8    Period Weeks    Status New      PT LONG TERM GOAL #4   Title Improve balance so pt able to perform Romberg test EC for at least 30 secs without LOB.    Baseline 15 secs with feet 4" apart;   04-26-20 - achieved with mild unsteadiness    Time 8    Period Weeks    Status Achieved      PT LONG TERM GOAL #5   Title Independent in updated HEP for balance and vestibular exs.    Time 8    Period Weeks    Status Achieved                 Plan - 05/04/20 1916    Clinical Impression Statement Pt is progressing well towards goals; continues to have c/o dizziness with turns and with activities with eyes closed, but subsides quickly with short rest periods. Cont with  POC - plan D/C next session.    Personal Factors and Comorbidities Behavior Pattern;Comorbidity 2;Time since onset of injury/illness/exacerbation;Profession;Past/Current Experience    Comorbidities h/o multiple concussions, migraines,  cluster HA's, bipolar disorder    Stability/Clinical Decision Making Evolving/Moderate complexity    Rehab Potential Good    PT Frequency 2x / week    PT Duration 4 weeks   followed by 1x/week for 4 weeks   PT Treatment/Interventions ADLs/Self Care Home Management;Therapeutic activities;Therapeutic exercise;Patient/family education;Vestibular;Balance training;Neuromuscular re-education;Gait training    PT Next Visit Plan corner balance and habituation added to HEP.  Progress HEP as indicated.  Habituation to looking up, bending forwards, walking with head turns, balance reactions.    PT Home Exercise Plan x1 viewing    Consulted and Agree with Plan of Care Patient           Patient will benefit from skilled therapeutic intervention in order to improve the following deficits and impairments:  Decreased balance,Difficulty walking,Dizziness,Postural dysfunction,Decreased cognition  Visit Diagnosis: Unsteadiness on feet  Dizziness and giddiness     Problem List Patient Active Problem List   Diagnosis Date Noted  . 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 Family history of genetic disease carrier   . Family history of breast cancer   . Family history of colon cancer   . History of multiple concussions 11/20/2017  . 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 Insomnia 02/17/2015  . Right shoulder pain 12/31/2014  . Episodic cluster headache, not intractable 12/02/2014  . 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 Photophobia of both eyes 05/04/2014  . Nausea with vomiting 05/04/2014  . Bipolar I disorder, most recent episode mixed ([place]) 04/18/2014  . Suicidal ideation 04/12/2014  . Injury of right shoulder and upper arm 02/17/2014  . Migraine with status migrainosus 01/08/2013  . Chronic migraine 05/08/2012  . Contact dermatitis 11/27/2011  . Generalized anxiety disorder 10/27/2011  . ADHD (attention deficit  hyperactivity disorder), 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  . Borderline personality disorder ([place]) 10/27/2011  . Right foot pain 09/28/2011  . 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 Malignant tumor of muscle ([place]) 09/02/2010  . Ganglion cyst 09/29/2009  . Pes planus 07/01/2008    Alda Lea, PT 05/04/2020, 7:19 PM  [facility] [address] [place], [place], [address] Phone: [phone]   Fax:  [phone]  Name: [name] MRN: [id number] Date of Birth: 1970/08/22

## 2020-05-05 DIAGNOSIS — G8929 Other chronic pain: Secondary | ICD-10-CM | POA: Diagnosis not present

## 2020-05-05 DIAGNOSIS — F3130 Bipolar disorder, current episode depressed, mild or moderate severity, unspecified: Secondary | ICD-10-CM | POA: Diagnosis not present

## 2020-05-05 DIAGNOSIS — F319 Bipolar disorder, unspecified: Secondary | ICD-10-CM | POA: Diagnosis not present

## 2020-05-05 DIAGNOSIS — I10 Essential (primary) hypertension: Secondary | ICD-10-CM | POA: Diagnosis not present

## 2020-05-05 DIAGNOSIS — G43001 Migraine without aura, not intractable, with status migrainosus: Secondary | ICD-10-CM | POA: Diagnosis not present

## 2020-05-07 ENCOUNTER — Other Ambulatory Visit: Payer: Self-pay | Admitting: Podiatry

## 2020-05-07 ENCOUNTER — Encounter: Payer: Self-pay | Admitting: Podiatry

## 2020-05-07 ENCOUNTER — Other Ambulatory Visit: Payer: Self-pay

## 2020-05-07 ENCOUNTER — Ambulatory Visit (INDEPENDENT_AMBULATORY_CARE_PROVIDER_SITE_OTHER): Payer: PPO

## 2020-05-07 ENCOUNTER — Ambulatory Visit: Payer: PPO | Admitting: Podiatry

## 2020-05-07 DIAGNOSIS — M79671 Pain in right foot: Secondary | ICD-10-CM | POA: Diagnosis not present

## 2020-05-07 DIAGNOSIS — B070 Plantar wart: Secondary | ICD-10-CM | POA: Insufficient documentation

## 2020-05-07 DIAGNOSIS — M21619 Bunion of unspecified foot: Secondary | ICD-10-CM

## 2020-05-07 DIAGNOSIS — M21611 Bunion of right foot: Secondary | ICD-10-CM

## 2020-05-07 DIAGNOSIS — G4730 Sleep apnea, unspecified: Secondary | ICD-10-CM | POA: Insufficient documentation

## 2020-05-07 DIAGNOSIS — R41840 Attention and concentration deficit: Secondary | ICD-10-CM | POA: Insufficient documentation

## 2020-05-07 DIAGNOSIS — R32 Unspecified urinary incontinence: Secondary | ICD-10-CM | POA: Insufficient documentation

## 2020-05-07 DIAGNOSIS — M722 Plantar fascial fibromatosis: Secondary | ICD-10-CM | POA: Diagnosis not present

## 2020-05-07 DIAGNOSIS — L237 Allergic contact dermatitis due to plants, except food: Secondary | ICD-10-CM | POA: Diagnosis not present

## 2020-05-07 MED ORDER — TRIAMCINOLONE ACETONIDE 10 MG/ML IJ SUSP
10.0000 mg | Freq: Once | INTRAMUSCULAR | Status: AC
Start: 2020-05-07 — End: 2020-05-07
  Administered 2020-05-07: 10 mg

## 2020-05-07 NOTE — Progress Notes (Signed)
Subjective:   Patient ID: [name], female   DOB: 50 y.o.   MRN: [id number]   HPI Patient presents stating that she has had a lot of pain in the bottom of her right foot does not remember specific injury and did have bunion surgery 8 years ago.  States this pain has been constant is been present around 2 weeks and that she has had some other health issues and has had postconcussion syndrome and does not smoke likes to be active   Review of Systems  All other systems reviewed and are negative.       Objective:  Physical Exam Vitals and nursing note reviewed.  Constitutional:      Appearance: She is well-developed.  Pulmonary:     Effort: Pulmonary effort is normal.  Musculoskeletal:        General: Normal range of motion.  Skin:    General: Skin is warm.  Neurological:     Mental Status: She is alert.     Neurovascular status intact muscle strength adequate range of motion adequate with patient found to have quite a bit of discomfort in the distal fascial band right just proximal to the insertion into the head of the first metatarsal and has had bunion correction right foot with minimal reoccurrence at the current time     Assessment:  Acute plantar fasciitis right distal along with history of structural bunion deformity right with moderate correction     Plan:  H&P x-rays reviewed conditions discussed at great length.  For the inflammation I did do a distal prep of the area I injected the fascia 3 mg Dexasone Kenalog 5 mg Xylocaine and I went ahead today and I discussed bunion I was satisfied with position do not recommend further treatment.  Patient will be seen back as indicated  X-rays indicate satisfactory position fixation in place no indications of calcification of the plantar fascial distal

## 2020-05-17 ENCOUNTER — Ambulatory Visit: Payer: PPO | Admitting: Physical Therapy

## 2020-05-18 ENCOUNTER — Ambulatory Visit: Payer: PPO | Admitting: Physical Therapy

## 2020-05-18 ENCOUNTER — Other Ambulatory Visit: Payer: Self-pay

## 2020-05-18 DIAGNOSIS — R2681 Unsteadiness on feet: Secondary | ICD-10-CM | POA: Diagnosis not present

## 2020-05-18 DIAGNOSIS — R42 Dizziness and giddiness: Secondary | ICD-10-CM

## 2020-05-19 NOTE — Therapy (Signed)
[facility] [address] [place], [place], [address] Phone: [phone]   Fax:  [phone]  Physical Therapy Treatment & Discharge Summary  Patient Details  Name: [name] MRN: [id number] Date of Birth: 1970/05/18 Referring Provider (PT): Ward Givens, NP   Encounter Date: 05/18/2020   PT End of Session - 05/19/20 1838    Visit Number 15    Number of Visits 15    Date for PT Re-Evaluation 04/30/20    Authorization Type [employer]    Authorization Time Period 03-01-20 - 05-29-20    PT Start Time 1150    PT Stop Time 1232    PT Time Calculation (min) 42 min    Activity Tolerance Patient tolerated treatment well    Behavior During Therapy [facility] for tasks assessed/performed           Past Medical History:  Diagnosis Date  . Achilles tendinitis   . ADHD (attention deficit hyperactivity disorder), inattentive type    Diagnosed as an adult after starting college  . Allergy   . Arthritis   . Bipolar I disorder 04/18/2014   most recent episode mixed  . Borderline personality disorder 10/27/2011  . Cataracts, bilateral   . Chronic pain disorder    due to several injuries affecting numerous areas of her body throughout the years  . 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 Eczema   . Episodic cluster headache, not intractable 12/02/2014  . Family history of genetic disease carrier   . Ganglion cyst 09/29/2009   left wrist (2 cyst)  . Generalized anxiety disorder 10/27/2011  . History of multiple concussions    October 2018 and September 2019  . Hyperprolactinemia   . Hypertension   . Insomnia 02/17/2015  . Lipoma   . 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 Migraine with status migrainosus 01/08/2013  . 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 Pes planus   . Photophobia of both eyes 05/04/2014    Past Surgical History:  Procedure Laterality Date  . ANKLE SURGERY  12/88   left   . chest nodule  1990?   rt chest wall nodule removal  . GANGLION CYST EXCISION  2011  . lipoma removal    . right bunioectomy    . SHOULDER SURGERY  01/13/2011   right, partial tear  . tumor resection left thigh      There were no vitals filed for this visit.   Subjective Assessment - 05/19/20 1815    Subjective Pt reports dizziness continues to occur intermittently and spontaneously  - overall doing fairly well    Pertinent History h/o concussions - Oct. 2018 and Sept. 2019; headaches/migraines; memory problems, ADHD, bipolar disorder, photophobia    Patient Stated Goals "be able to stand up straight and not feel like I am teetering over"; improve balance    Currently in Pain? No/denies    Pain Onset More than a month ago    Pain Onset More than a month ago              [facility] PT Assessment - 05/19/20 0001      Functional Gait  Assessment   Gait assessed  Yes    Gait Level Surface Walks 20 ft in less than 5.5 sec, no assistive devices, good  speed, no evidence for imbalance, normal gait pattern, deviates no more than 6 in outside of the 12 in walkway width.   5.97, 6.12   Change in Gait Speed Able to smoothly change walking speed without loss of balance or gait deviation. Deviate no more than 6 in outside of the 12 in walkway width.    Gait with Horizontal Head Turns Performs head turns smoothly with slight change in gait velocity (eg, minor disruption to smooth gait path), deviates 6-10 in outside 12 in walkway width, or uses an assistive device.    Gait with Vertical Head Turns Performs task with slight change in gait velocity (eg, minor disruption to smooth gait path), deviates 6 - 10 in outside 12 in walkway width or uses assistive device   slightly more dizziness with vertical than horizontal head turns    Gait and Pivot Turn Pivot turns safely in greater than 3 sec and stops with no loss of balance, or pivot turns safely within 3 sec and stops with mild imbalance, requires small steps to catch balance.    Step Over Obstacle Is able to step over one shoe box (4.5 in total height) but must slow down and adjust steps to clear box safely. May require verbal cueing.    Gait with Narrow Base of Support Ambulates 4-7 steps.    Gait with Eyes Closed Walks 20 ft, uses assistive device, slower speed, mild gait deviations, deviates 6-10 in outside 12 in walkway width. Ambulates 20 ft in less than 9 sec but greater than 7 sec.    Ambulating Backwards Walks 20 ft, no assistive devices, good speed, no evidence for imbalance, normal gait    Steps Alternating feet, must use rail.    Total Score 21               Vestibular Assessment - 05/19/20 0001      Visual Acuity   Static line 11    Dynamic line 9                    [facility] Adult PT Treatment/Exercise - 05/19/20 0001      Ambulation/Gait   Ambulation/Gait Yes    Ambulation Distance (Feet) 50 Feet    Assistive device None    Gait Comments with horizontal head turns               Balance Exercises - 05/19/20 0001      Balance Exercises: Standing   Standing Eyes Opened Narrow base of support (BOS);Wide (BOA);Head turns;Foam/compliant surface;5 reps   horizontal & vertical head turns   Standing Eyes Closed Narrow base of support (BOS);Wide (BOA);Head turns;Foam/compliant surface;5 reps   horizontal & vertical head turns            PT Education - 05/19/20 1837    Education Details reviewed HEP (balance on foam)    Person(s) Educated Patient    Methods Explanation   pt has handout as previously issued   Comprehension Verbalized understanding;Returned demonstration            PT Short Term Goals - 05/19/20 1839      PT SHORT TERM GOAL #1   Title Pt will improve gaze stabilization to </= 3 line difference for DVA.     Baseline 4 line difference on 03-01-20;  04-01-20  line 11/line 8    Time 4    Period Weeks    Status Achieved    Target Date 04/02/20  PT SHORT TERM GOAL #2   Title Increase FGA score by at least 4 points for improved balance with gait.    Baseline FGA score 18/30 (04-01-20)    Time 4    Period Weeks    Status On-going    Target Date 04/02/20      PT SHORT TERM GOAL #3   Title Pt will amb. 30' with horizontal head turns with minimal sway in path deviation for increased safety with environmental scanning with amb.    Baseline pt reports dizziness 3/10 with turning and stopping; met 04-01-20    Time 4    Period Weeks    Status Achieved    Target Date 04/02/20      PT SHORT TERM GOAL #4   Title Independent in HEP for balance and vestibular exercises.    Time 4    Period Weeks    Status Achieved    Target Date 04/02/20             PT Long Term Goals - 05/18/20 1158      PT LONG TERM GOAL #1   Title Pt will improve DVA to </= 2 line difference for improved gaze stabilization.    Baseline 4 line difference; met with exception of 2 line letters - line 11 static; line 9 dynamic - 05-18-20    Time 8    Period Weeks    Status Achieved      PT LONG TERM GOAL #2   Title Improve FOTO score from 48/100 to 61/100 to demo improvement in dizziness.    Time 8    Period Weeks    Status Achieved      PT LONG TERM GOAL #3   Title Increase FGA score by at least 8 points to increase safety with gait.    Baseline score 18/30 at initial;  score 21/30 - 05-18-20    Time 8    Period Weeks    Status Partially Met      PT LONG TERM GOAL #4   Title Improve balance so pt able to perform Romberg test EC for at least 30 secs without LOB.    Baseline 15 secs with feet 4" apart;   04-26-20 - achieved with mild unsteadiness; met 05-18-20    Time 8    Period Weeks    Status Achieved      PT LONG TERM GOAL #5   Title Independent in updated HEP for balance and vestibular exs.    Time 8    Period  Weeks    Status Achieved                 Plan - 05/19/20 1843    Clinical Impression Statement Pt has met LTG's #1-2, 4-5;  LTG #3 partially met with FGA score increasing from 18/30 to 21/30 (not 8 point increase per stated goal).  Pt is discharged due to completion of program and plateau in maximizing functional status at this time.    Personal Factors and Comorbidities Behavior Pattern;Comorbidity 2;Time since onset of injury/illness/exacerbation;Profession;Past/Current Experience    Comorbidities h/o multiple concussions, migraines, cluster HA's, bipolar disorder    Stability/Clinical Decision Making Evolving/Moderate complexity    Rehab Potential Good    PT Frequency 2x / week    PT Duration 4 weeks   followed by 1x/week for 4 weeks   PT Treatment/Interventions ADLs/Self Care Home Management;Therapeutic activities;Therapeutic exercise;Patient/family education;Vestibular;Balance training;Neuromuscular re-education;Gait training    PT Next Visit Plan D/C on  05-18-20    PT Home Exercise Plan x1 viewing    Consulted and Agree with Plan of Care Patient           Patient will benefit from skilled therapeutic intervention in order to improve the following deficits and impairments:  Decreased balance,Difficulty walking,Dizziness,Postural dysfunction,Decreased cognition  Visit Diagnosis: Unsteadiness on feet  Dizziness and giddiness     Problem List Patient Active Problem List   Diagnosis Date Noted  . Attention and concentration deficit 05/07/2020  . Morbid obesity ([place]) 05/07/2020  . Plantar wart 05/07/2020  . Sleep apnea 05/07/2020  . Urinary incontinence 05/07/2020  . 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 Family history of genetic disease carrier   . Family history of breast cancer   . Family history of colon cancer   . History of multiple concussions 11/20/2017  . 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 Insomnia 02/17/2015  . Right shoulder pain  12/31/2014  . Episodic cluster headache, not intractable 12/02/2014  . 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 Photophobia of both eyes 05/04/2014  . Nausea with vomiting 05/04/2014  . Bipolar I disorder, most recent episode mixed ([place]) 04/18/2014  . Suicidal ideation 04/12/2014  . Injury of right shoulder and upper arm 02/17/2014  . Migraine with status migrainosus 01/08/2013  . Chronic migraine 05/08/2012  . Contact dermatitis 11/27/2011  . Generalized anxiety disorder 10/27/2011  . 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 Borderline personality disorder ([place]) 10/27/2011  . Right foot pain 09/28/2011  . 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 Malignant tumor of muscle ([place]) 09/02/2010  . Ganglion cyst 09/29/2009  . Pes planus 07/01/2008      PHYSICAL THERAPY DISCHARGE SUMMARY  Visits from Start of Care: 15  Current functional level related to goals / functional outcomes: See above for progress towards goals   Remaining deficits: Continued c/o spontaneous dizziness Decreased high level gait and balance requiring increased vestibular input     Education / Equipment: Pt has been instructed in HEP consisting of vestibular exercises. Plan: Patient agrees to discharge.  Patient goals were partially met. Patient is being discharged due to meeting the stated rehab goals.  ?????       Alda Lea, PT 05/19/2020, 10:28 PM  [facility] [address] [place], [place], [address] Phone: [phone]   Fax:  [phone]  Name: [name] MRN: [id number] Date of Birth: July 28, 1970

## 2020-06-22 ENCOUNTER — Other Ambulatory Visit: Payer: Self-pay

## 2020-06-22 ENCOUNTER — Ambulatory Visit
Admission: RE | Admit: 2020-06-22 | Discharge: 2020-06-22 | Disposition: A | Discharge status: Home / Self Care | Payer: PPO | Source: Ambulatory Visit | Attending: Family Medicine | Admitting: Family Medicine

## 2020-06-22 DIAGNOSIS — Z1231 Encounter for screening mammogram for malignant neoplasm of breast: Secondary | ICD-10-CM

## 2020-06-23 ENCOUNTER — Ambulatory Visit: Payer: PPO

## 2020-06-23 DIAGNOSIS — R21 Rash and other nonspecific skin eruption: Secondary | ICD-10-CM | POA: Diagnosis not present

## 2020-06-23 DIAGNOSIS — W57XXXA Bitten or stung by nonvenomous insect and other nonvenomous arthropods, initial encounter: Secondary | ICD-10-CM | POA: Diagnosis not present

## 2020-06-23 DIAGNOSIS — R6883 Chills (without fever): Secondary | ICD-10-CM | POA: Diagnosis not present

## 2020-06-23 DIAGNOSIS — S40869A Insect bite (nonvenomous) of unspecified upper arm, initial encounter: Secondary | ICD-10-CM | POA: Diagnosis not present

## 2020-06-23 DIAGNOSIS — R5383 Other fatigue: Secondary | ICD-10-CM | POA: Diagnosis not present

## 2020-07-02 DIAGNOSIS — M7918 Myalgia, other site: Secondary | ICD-10-CM | POA: Diagnosis not present

## 2020-07-02 DIAGNOSIS — S29012A Strain of muscle and tendon of back wall of thorax, initial encounter: Secondary | ICD-10-CM | POA: Diagnosis not present

## 2020-07-13 DIAGNOSIS — M545 Low back pain, unspecified: Secondary | ICD-10-CM | POA: Diagnosis not present

## 2020-07-19 ENCOUNTER — Ambulatory Visit: Payer: PPO | Admitting: Adult Health

## 2020-07-19 ENCOUNTER — Encounter: Payer: Self-pay | Admitting: Adult Health

## 2020-07-19 VITALS — BP 119/72 | HR 71 | Ht 69.0 in | Wt 275.0 lb

## 2020-07-19 DIAGNOSIS — G2581 Restless legs syndrome: Secondary | ICD-10-CM | POA: Diagnosis not present

## 2020-07-19 DIAGNOSIS — R42 Dizziness and giddiness: Secondary | ICD-10-CM

## 2020-07-19 DIAGNOSIS — G43009 Migraine without aura, not intractable, without status migrainosus: Secondary | ICD-10-CM | POA: Diagnosis not present

## 2020-07-19 DIAGNOSIS — Z5181 Encounter for therapeutic drug level monitoring: Secondary | ICD-10-CM

## 2020-07-19 NOTE — Progress Notes (Signed)
PATIENT: [name] DOB: [birth year]  REASON FOR VISIT: follow up HISTORY FROM: patient Primary neurologist: Dr. Brett Fairy  HISTORY OF PRESENT ILLNESS: Today 07/19/20:  [name] is a 50 year old female with a history of postconcussive syndrome, migraine headaches, dizziness and memory disturbance.  She returns today for follow-up.  She states that she is only having approximately 1 migraine a week.  She typically can take Tylenol and it resolves fairly quickly.  She does have sumatriptan if needed but she has not had to use this.  She did go to physical therapy for vestibular rehab.  She states that her episodes of dizziness has improved.  They are spontaneous and do not occur daily.  No episode has lasted longer than 10 minutes.  They typically only last several minutes.  She also reports that at night she is having what she believes is restless legs.  She states that her legs start jumping and she has a trouble falling asleep.  Fortunately it does not occur every night but does occur most nights.  She has never had any formal work-up for restless leg symptoms.  02/05/20: [name] is a 50 year old female with a history of postconcussive syndrome, migraine headaches and memory disturbance.  She returns today for follow-up.  She was just seen on 1216 and was doing well.  She states that in the last 2 weeks she has been having dizziness that she describes as the room spinning, feeling as if she may pass out during these episodes.  She also describes it as feeling as if she is on an elevator and the floor gives out.  She states in the last week the episodes have increased to at least every other day.  The episodes are typically brief and reports that she can typically hold onto the counter and it slowly resolves.  She states that she had the same symptoms when she was diagnosed with a concussion.  She went to vestibular rehab and they resolved.  She states that she did fall into her refrigerator  3 weeks ago but no significant injuries.  She states that she recently was at [facility] and bent down to pick something up off a shelf and hit her head on a cart.   01/15/20: [name] is a 50 year old female with a history of postconcussive syndrome, migraine headaches and memory disturbance.  She returns today for follow-up.  At the last visit she was referred to neuropsychology for memory evaluation.  They felt that her memory disturbance was due to ADHD.  It was recommended that she be put back on stimulant medication.  She reports that her psychiatrist did put her back on Adderall.  She has found it somewhat beneficial.  She continues to struggle some at work with inputting information.  Migraines have been controlled on Emgality and Depakote.  She returns today for an evaluation.  07/21/19: [name] is a 50 year old female with a history of postconcussive syndrome and migraine headaches.  She returns today for follow-up.  She states that she has been seeing speech therapy and feels that it has helped some.  She reports her therapist has mentioned additional testing with neuropsychology.  Patient states that she works at [employer].  States that she had a customer recently who was on the phone while checking out.  She states that she became very overwhelmed over this incident.  She states that she also has occasions where she forgets what she goes into a room for.  Often she  does remember it later.  She states that she often loses her train of thought in mid sentence.  Or may not be able to find the right word to say.  She also states when she is working if a customer gives her her email address she has a hard time processing it.  She was on Adderall when she was in school.  She states that her psychiatrist stopped this when school ended.  She is not been on this medication for quite some time.  She returns today for an evaluation.  HISTORY 02/17/19:   [name] is a 50 year old female with a history of  postconcussive syndrome and migraine headaches.  She returns today for follow-up.  The patient reports that Toradol injection resolved her headache.  She has not had a headache since then.  She continues on Depakote and Emgality.  Her next injection is February 10 for [employer].  She continues to have chronic dizziness.  She also reports some memory issues but is unsure if it is just "brain fog" she states on occasion she will also drop things but this has been ongoing.  The patient had a CT of the head back in 2019 after hitting her head that was unremarkable.  She returns today for an evaluation.  REVIEW OF SYSTEMS: Out of a complete 14 system review of symptoms, the patient complains only of the following symptoms, and all other reviewed systems are negative.  See HPI  ALLERGIES: Allergies  Allergen Reactions   Adhesive [Tape] Itching and Rash    Also reacted to Steri Strips and Band-Aids.   Dilaudid [Hydromorphone Hcl] Itching   Morphine Nausea And Vomiting   Penicillins Hives    Has patient had a PCN reaction causing immediate rash, facial/tongue/throat swelling, SOB or lightheadedness with hypotension: YES Has patient had a PCN reaction causing severe rash involving mucus membranes or skin necrosis: NO Has patient had a PCN reaction that required hospitalization NO Has patient had a PCN reaction occurring within the last 10 years:NO If all of the above answers are "NO", then may proceed with Cephalosporin use.   Percocet [Oxycodone-Acetaminophen] Itching   Prednisone Hives   Provera [Medroxyprogesterone Acetate] Other (See Comments)    Causes manic episodes   Ultram [Tramadol Hcl] Itching    HOME MEDICATIONS: Outpatient Medications Prior to Visit  Medication Sig Dispense Refill   ALFALFA PO Take 500 mg by mouth daily.     amphetamine-dextroamphetamine (ADDERALL XR) 20 MG 24 hr capsule 1 capsule in the morning     Ascorbic Acid (VITAMIN C) 1000 MG tablet Take 1,000 mg by mouth  daily.     augmented betamethasone dipropionate (DIPROLENE-AF) 0.05 % cream Apply topically.     calcium carbonate (OSCAL) 1500 (600 Ca) MG TABS tablet in the morning and at bedtime.     CALCIUM PO Take 600 mg by mouth in the morning and at bedtime.     Cholecalciferol (VITAMIN D3 PO) Take 50 mcg by mouth daily.     Cyanocobalamin (B-12 PO) Take 5,000 mcg by mouth daily.     divalproex (DEPAKOTE ER) 500 MG 24 hr tablet TAKE 2 TABLETS BY MOUTH AT BEDTIME FOR MOOD 60 tablet 5   FLUARIX QUADRIVALENT 0.5 ML injection      Galcanezumab-gnlm (EMGALITY) 120 MG/ML SOSY Inject 120 mg into the skin every 30 (thirty) days. 1.12 mL 11   hydrOXYzine (VISTARIL) 50 MG capsule Take 50 mg by mouth in the morning and at bedtime.  2   lamoTRIgine (LAMICTAL) 150 MG tablet Take 300 mg by mouth at bedtime.  1   LATUDA 20 MG TABS tablet Take 20 mg by mouth every morning.     loratadine (CLARITIN) 10 MG tablet Take 10 mg by mouth daily.     lurasidone (LATUDA) 80 MG TABS tablet Take 1 tablet (80 mg total) by mouth daily with supper. For mood control 30 tablet 0   metoprolol succinate (TOPROL-XL) 100 MG 24 hr tablet Take 1 tablet (100 mg total) by mouth daily. Take with or immediately following a meal. 30 tablet 0   Multiple Vitamin (MULTIVITAMIN WITH MINERALS) TABS tablet Take 1 tablet by mouth daily.     Multiple Vitamins-Minerals (ZINC PO) Take 50 mg by mouth daily.     mupirocin ointment (BACTROBAN) 2 % Apply topically.     Omega-3 Fatty Acids (OMEGA 3 PO) Take by mouth.     promethazine (PHENERGAN) 25 MG tablet Take 1 tablet (25 mg total) by mouth every 8 (eight) hours as needed for nausea or vomiting. TK 1 T PO BID FOR 15 DAYS PRN 30 tablet 0   SUMAtriptan 6 MG/0.5ML SOAJ Inject into the skin.     SUMAtriptan Succinate Refill 6 MG/0.5ML SOCT Inject 0.5 ml SQ at the onset of migraine. May repeat in 2 hours if needed. 0.5 mL 5   traZODone (DESYREL) 100 MG tablet Take 100 mg by mouth at bedtime.     vitamin B-12  (CYANOCOBALAMIN) 100 MCG tablet 1 tablet     Zinc 50 MG TABS 1 tablet     cyclobenzaprine (FLEXERIL) 10 MG tablet Take 1 tablet by mouth 3 (three) times daily.     doxycycline (VIBRAMYCIN) 100 MG capsule Take 100 mg by mouth 2 (two) times daily.     Facility-Administered Medications Prior to Visit  Medication Dose Route Frequency Provider Last Rate Last Admin   methylPREDNISolone acetate (DEPO-MEDROL) injection 40 mg  40 mg Intra-articular Once Hudnall, Sharyn Lull, MD       valproate (DEPACON) 1,000 mg in dextrose 5 % 50 mL IVPB  1,000 mg Intravenous Once Dohmeier, Asencion Partridge, MD        PAST MEDICAL HISTORY: Past Medical History:  Diagnosis Date   Achilles tendinitis    ADHD (attention deficit hyperactivity disorder), inattentive type    Diagnosed as an adult after starting college   Allergy    Arthritis    Bipolar I disorder 04/18/2014   most recent episode mixed   Borderline personality disorder 10/27/2011   Cataracts, bilateral    Chronic pain disorder    due to several injuries affecting numerous areas of her body throughout the years   Chronic paroxysmal hemicrania, 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   rt chest wall nodule removal   GANGLION CYST EXCISION  2011   lipoma removal     right bunioectomy     SHOULDER SURGERY  01/13/2011   right, partial tear   tumor resection left thigh      FAMILY HISTORY: Family History  Problem Relation Age of Onset   Hypertension Mother    Hyperlipidemia Mother    Breast cancer Mother 55       genetic testing- CHEK2 likely pathogenic variant   Endometrial cancer Mother    Heart attack Father    Heart disease Father    Hypertension Father    Bipolar disorder Father    Diabetes Paternal Grandfather    Heart disease Maternal Aunt    Breast cancer Maternal Aunt 74   Heart disease Maternal Grandmother    Colon cancer Maternal Grandmother 66   Colon cancer Maternal Uncle 75   Lymphoma Maternal Grandfather 84   Breast cancer Maternal Aunt 65   Breast cancer Maternal Aunt 35   Breast cancer Maternal Aunt    Bone cancer Cousin 45   Cervical cancer Cousin        Genetic testing- 'was positive for a gene' had a mastectomy    SOCIAL HISTORY: Social History   Socioeconomic History   Marital status: Single    Spouse name: Not on file   Number of children: 0   Years of education: 16   Highest education level: Bachelor's degree (e.g., BA, AB, BS)  Occupational History    Employer: [employer]  Tobacco Use   Smoking status: Never   Smokeless tobacco: Never  Substance and Sexual Activity   Alcohol use: No    Alcohol/week: 0.0 standard drinks   Drug use: No   Sexual activity: Never    Birth control/protection: None  Other Topics Concern   Not on file  Social History Narrative   Caffeine  2 sodas daily, 1 cup coffee daily.   Social Determinants of Health   Financial Resource Strain: Not on file  Food Insecurity: Not on file  Transportation Needs: Not on file  Physical Activity: Not on file  Stress: Not on file  Social Connections: Not on file  Intimate Partner Violence: Not on file      PHYSICAL EXAM  Vitals:    07/19/20 0917  BP: 119/72  Pulse: 71  Weight: 275 lb (124.7 kg)  Height: 5' 9"  (1.753 m)   Body mass index is 40.61 kg/m.  Generalized: Well developed, in no acute distress   Neurological examination  Mentation: Alert oriented to time, place, history taking. Follows all commands speech and language fluent Cranial nerve II-XII: Pupils were equal round reactive to light. Extraocular movements were full, visual field were full on confrontational test.  Head turning and shoulder shrug  were normal and symmetric. Motor: The motor testing reveals 5 over 5 strength of all 4 extremities. Good symmetric motor tone is noted throughout.  Sensory: Sensory testing is intact to soft touch on all 4 extremities. No evidence of extinction is noted.  Coordination: Cerebellar testing reveals good finger-nose-finger and heel-to-shin bilaterally.  Gait and station: Gait is slightly unsteady.  Tandem gait is unsteady.  Romberg is negative but unsteady Reflexes: Deep tendon reflexes are symmetric and normal bilaterally.   DIAGNOSTIC DATA (LABS, IMAGING, TESTING) - I reviewed patient  records, labs, notes, testing and imaging myself where available.  Lab Results  Component Value Date   WBC 7.0 01/15/2020   HGB 14.8 01/15/2020   HCT 44.0 01/15/2020   MCV 89 01/15/2020   PLT 218 01/15/2020      Component Value Date/Time   NA 142 01/15/2020 0948   K 4.6 01/15/2020 0948   CL 106 01/15/2020 0948   CO2 23 01/15/2020 0948   GLUCOSE 81 01/15/2020 0948   GLUCOSE 92 05/08/2019 1200   BUN 10 01/15/2020 0948   CREATININE 1.08 (H) 01/15/2020 0948   CREATININE 0.83 09/11/2011 1524   CALCIUM 9.6 01/15/2020 0948   PROT 5.8 (L) 01/15/2020 0948   ALBUMIN 4.1 01/15/2020 0948   AST 14 01/15/2020 0948   ALT 7 01/15/2020 0948   ALKPHOS 70 01/15/2020 0948   BILITOT 0.4 01/15/2020 0948   GFRNONAA 60 01/15/2020 0948   GFRAA 69 01/15/2020 0948   Lab Results  Component Value Date   CHOL 198 12/29/2016   HDL 53  12/29/2016   LDLCALC 118 (H) 12/29/2016   TRIG 136 12/29/2016   CHOLHDL 3.7 12/29/2016   Lab Results  Component Value Date   HGBA1C 5.1 12/29/2016   No results found for: [id number] Lab Results  Component Value Date   TSH 4.288 12/29/2016      ASSESSMENT AND PLAN 50 y.o. year old female  has a past medical history of Achilles tendinitis, ADHD (attention deficit hyperactivity disorder), inattentive type, Allergy, Arthritis, Bipolar I disorder (04/18/2014), Borderline personality disorder (10/27/2011), Cataracts, bilateral, Chronic pain disorder, Chronic paroxysmal hemicrania, not intractable (12/02/2014), Eczema, Episodic cluster headache, not intractable (12/02/2014), Family history of genetic disease carrier, Ganglion cyst (09/29/2009), Generalized anxiety disorder (10/27/2011), History of multiple concussions, Hyperprolactinemia, Hypertension, Insomnia (02/17/2015), Lipoma, Malignant tumor of muscle (09/02/2010), Migraine with status migrainosus (04/59/9774), Monoallelic 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 here with:  Vertigo  Continue exercises learned at rehab  Migraine Headaches  Continue Emgality Continue sumatriptan as needed for abortive therapy  Restless leg symptoms  Blood work today-iron and ferritin levels, B12 Pending blood work we may consider medication   Follow-up in 6 months or sooner if needed    Ward Givens, MSN, NP-C 07/19/2020, 9:38 AM [facility] [address] [place], [place] [address] [phone]

## 2020-07-19 NOTE — Patient Instructions (Signed)
Your Plan:  Continue Depakote and Emgality  Ok to use imitrex if needed Blood work today If your symptoms worsen or you develop new symptoms please let us know.    Thank you for coming to see [place] at [facility]. I hope we have been able to provide you high quality care today.  You may receive a patient satisfaction survey over the next few weeks. We would appreciate your feedback and comments so that we may continue to improve ourselves and the health of our patients.

## 2020-07-20 LAB — COMPREHENSIVE METABOLIC PANEL
ALT: 13 IU/L (ref 0–32)
AST: 18 IU/L (ref 0–40)
Albumin/Globulin Ratio: 2.4 — ABNORMAL HIGH (ref 1.2–2.2)
Albumin: 4.3 g/dL (ref 3.8–4.8)
Alkaline Phosphatase: 93 IU/L (ref 44–121)
BUN/Creatinine Ratio: 15 (ref 9–23)
BUN: 14 mg/dL (ref 6–24)
Bilirubin Total: 0.3 mg/dL (ref 0.0–1.2)
CO2: 25 mmol/L (ref 20–29)
Calcium: 9.7 mg/dL (ref 8.7–10.2)
Chloride: 101 mmol/L (ref 96–106)
Creatinine, Ser: 0.93 mg/dL (ref 0.57–1.00)
Globulin, Total: 1.8 g/dL (ref 1.5–4.5)
Glucose: 78 mg/dL (ref 65–99)
Potassium: 4.5 mmol/L (ref 3.5–5.2)
Sodium: 140 mmol/L (ref 134–144)
Total Protein: 6.1 g/dL (ref 6.0–8.5)
eGFR: 75 mL/min/{1.73_m2} (ref >59)

## 2020-07-20 LAB — CBC WITH DIFFERENTIAL/PLATELET
Basophils Absolute: 0.1 10*3/uL (ref 0.0–0.2)
Basos: 1 %
EOS (ABSOLUTE): 0.2 10*3/uL (ref 0.0–0.4)
Eos: 2 %
Hematocrit: 43.1 % (ref 34.0–46.6)
Hemoglobin: 14.2 g/dL (ref 11.1–15.9)
Immature Grans (Abs): 0 10*3/uL (ref 0.0–0.1)
Immature Granulocytes: 0 %
Lymphocytes Absolute: 2.6 10*3/uL (ref 0.7–3.1)
Lymphs: 36 %
MCH: 29.8 pg (ref 26.6–33.0)
MCHC: 32.9 g/dL (ref 31.5–35.7)
MCV: 90 fL (ref 79–97)
Monocytes Absolute: 0.6 10*3/uL (ref 0.1–0.9)
Monocytes: 9 %
Neutrophils Absolute: 3.8 10*3/uL (ref 1.4–7.0)
Neutrophils: 52 %
Platelets: 246 10*3/uL (ref 150–450)
RBC: 4.77 x10E6/uL (ref 3.77–5.28)
RDW: 12.1 % (ref 11.7–15.4)
WBC: 7.2 10*3/uL (ref 3.4–10.8)

## 2020-07-20 LAB — IRON AND TIBC
Iron Saturation: 39 % (ref 15–55)
Iron: 111 ug/dL (ref 27–159)
Total Iron Binding Capacity: 286 ug/dL (ref 250–450)
UIBC: 175 ug/dL (ref 131–425)

## 2020-07-20 LAB — VITAMIN B12: Vitamin B-12: >2000 pg/mL — ABNORMAL HIGH (ref 232–1245)

## 2020-07-20 LAB — VALPROIC ACID LEVEL: Valproic Acid Lvl: 49 ug/mL — ABNORMAL LOW (ref 50–100)

## 2020-07-20 LAB — FERRITIN: Ferritin: 63 ng/mL (ref 15–150)

## 2020-07-26 DIAGNOSIS — F319 Bipolar disorder, unspecified: Secondary | ICD-10-CM | POA: Diagnosis not present

## 2020-07-26 DIAGNOSIS — I10 Essential (primary) hypertension: Secondary | ICD-10-CM | POA: Diagnosis not present

## 2020-07-26 DIAGNOSIS — G43001 Migraine without aura, not intractable, with status migrainosus: Secondary | ICD-10-CM | POA: Diagnosis not present

## 2020-07-26 DIAGNOSIS — G8929 Other chronic pain: Secondary | ICD-10-CM | POA: Diagnosis not present

## 2020-07-26 DIAGNOSIS — F3130 Bipolar disorder, current episode depressed, mild or moderate severity, unspecified: Secondary | ICD-10-CM | POA: Diagnosis not present

## 2020-08-04 ENCOUNTER — Ambulatory Visit: Payer: PPO | Admitting: Podiatrist

## 2020-08-04 ENCOUNTER — Other Ambulatory Visit: Payer: Self-pay

## 2020-08-04 DIAGNOSIS — M779 Enthesopathy, unspecified: Secondary | ICD-10-CM

## 2020-08-04 DIAGNOSIS — M722 Plantar fascial fibromatosis: Secondary | ICD-10-CM | POA: Diagnosis not present

## 2020-08-04 NOTE — Patient Instructions (Signed)
Plantar Fasciitis (Heel Spur Syndrome) with Rehab The plantar fascia is a fibrous, ligament-like, soft-tissue structure that spans the bottom of the foot. Plantar fasciitis is a condition that causes pain in the foot due to inflammation of the tissue. SYMPTOMS  Pain and tenderness on the underneath side of the foot. Pain that worsens with standing or walking. CAUSES  Plantar fasciitis is caused by irritation and injury to the plantar fascia on the underneath side of the foot. Common mechanisms of injury include: Direct trauma to bottom of the foot. Damage to a small nerve that runs under the foot where the main fascia attaches to the heel bone. Stress placed on the plantar fascia due to any mild increased activity or injury RISK INCREASES WITH:  Obesity. Poor strength and flexibility. Improperly fitted shoes. Tight calf muscles. Flat feet. Failure to warm-up properly before activity.  PREVENTION Warm up and stretch properly before activity. Strength, flexibility Maintain a health body weight. Avoid stress on the plantar fascia. Wear properly fitted shoes, including arch supports for individuals who have flat feet. PROGNOSIS  If treated properly, then the symptoms of plantar fasciitis usually resolve without surgery. However, occasionally surgery is necessary. RELATED COMPLICATIONS  Recurrent symptoms that may result in a chronic condition. Problems of the lower back that are caused by compensating for the injury, such as limping. Pain or weakness of the foot during push-off following surgery. Chronic inflammation, scarring, and partial or complete fascia tear, occurring more often from repeated injections. TREATMENT  Treatment initially involves the use of ice and medication to help reduce pain and inflammation. The use of strengthening and stretching exercises may help reduce pain with activity, especially stretches of the Achilles tendon.  Your caregiver may recommend that you use  arch supports to help reduce stress on the plantar fascia. Often, corticosteroid injections are given to reduce inflammation. If symptoms persist for greater than 6 months despite non-surgical (conservative), then surgery may be recommended.  MEDICATION  If pain medication is necessary, then nonsteroidal anti-inflammatory medications, such as aspirin and ibuprofen, or other minor pain relievers, such as acetaminophen, are often recommended. Corticosteroid injections may be given by your caregiver.  HEAT AND COLD Cold treatment (icing) relieves pain and reduces inflammation. Cold treatment should be applied for 10 to 15 minutes every 2 to 3 hours for inflammation and pain and immediately after any activity that aggravates your symptoms. Use ice packs or massage the area with a piece of ice (ice massage). Heat treatment may be used prior to performing the stretching and strengthening activities prescribed by your caregiver, physical therapist, or athletic trainer. Use a heat pack or soak the injury in warm water. SEEK IMMEDIATE MEDICAL CARE IF: Treatment seems to offer no benefit, or the condition worsens. Any medications produce adverse side effects.  Perform this particular stretch daily first thing in the morning and before you go to bed. Hold for 30 seconds.    Try all the exercises and choose your favorite 3 to perform daily--  EXERCISES-- perform each exercise a total of 10-15 repetitions.  Hold for 30 seconds and perform 3 times per day   RANGE OF MOTION (ROM) AND STRETCHING EXERCISES - Plantar Fasciitis (Heel Spur Syndrome) These exercises may help you when beginning to rehabilitate your injury.   While completing these exercises, remember:  Restoring tissue flexibility helps normal motion to return to the joints. This allows healthier, less painful movement and activity. An effective stretch should be held for at least 30 seconds.   A stretch should never be painful. You should only feel  a gentle lengthening or release in the stretched tissue. RANGE OF MOTION - Toe Extension, Flexion Sit with your right / left leg crossed over your opposite knee. Grasp your toes and gently pull them back toward the top of your foot. You should feel a stretch on the bottom of your toes and/or foot. Hold this stretch for __________ seconds. Now, gently pull your toes toward the bottom of your foot. You should feel a stretch on the top of your toes and or foot. Hold this stretch for __________ seconds. Repeat __________ times. Complete this stretch __________ times per day.  RANGE OF MOTION - Ankle Dorsiflexion, Active Assisted Remove shoes and sit on a chair that is preferably not on a carpeted surface. Place right / left foot under knee. Extend your opposite leg for support. Keeping your heel down, slide your right / left foot back toward the chair until you feel a stretch at your ankle or calf. If you do not feel a stretch, slide your bottom forward to the edge of the chair, while still keeping your heel down. Hold this stretch for __________ seconds. Repeat __________ times. Complete this stretch __________ times per day.  STRETCH  Gastroc, Standing Place hands on wall. Extend right / left leg, keeping the front knee somewhat bent. Slightly point your toes inward on your back foot. Keeping your right / left heel on the floor and your knee straight, shift your weight toward the wall, not allowing your back to arch. You should feel a gentle stretch in the right / left calf. Hold this position for __________ seconds. Repeat __________ times. Complete this stretch __________ times per day. STRETCH  Soleus, Standing Place hands on wall. Extend right / left leg, keeping the other knee somewhat bent. Slightly point your toes inward on your back foot. Keep your right / left heel on the floor, bend your back knee, and slightly shift your weight over the back leg so that you feel a gentle stretch  deep in your back calf. Hold this position for __________ seconds. Repeat __________ times. Complete this stretch __________ times per day. STRETCH  Gastrocsoleus, Standing  Note: This exercise can place a lot of stress on your foot and ankle. Please complete this exercise only if specifically instructed by your caregiver.  Place the ball of your right / left foot on a step, keeping your other foot firmly on the same step. Hold on to the wall or a rail for balance. Slowly lift your other foot, allowing your body weight to press your heel down over the edge of the step. You should feel a stretch in your right / left calf. Hold this position for __________ seconds. Repeat this exercise with a slight bend in your right / left knee. Repeat __________ times. Complete this stretch __________ times per day.  STRENGTHENING EXERCISES - Plantar Fasciitis (Heel Spur Syndrome)  These exercises may help you when beginning to rehabilitate your injury. They may resolve your symptoms with or without further involvement from your physician, physical therapist or athletic trainer. While completing these exercises, remember:  Muscles can gain both the endurance and the strength needed for everyday activities through controlled exercises. Complete these exercises as instructed by your physician, physical therapist or athletic trainer. Progress the resistance and repetitions only as guided.     

## 2020-08-04 NOTE — Progress Notes (Signed)
Chief Complaint  Patient presents with   Plantar Fasciitis    Follow up bilateral arch pain. Pt states right foot pain is still the same but now feels the exact same arch pain in left foot.      HPI: Patient is 50 y.o. female who presents today for the concerns as listed above. She relates she continues to have pain in the right foot and now the left foot is painful in the same location.  She states she has pain with the first step in the morning or after sitting for long periods.  She had an injection in April and states the pain is still present.  She relates the pain is in bilateral arches.    Patient Active Problem List   Diagnosis Date Noted   Attention and concentration deficit 05/07/2020   Morbid obesity ([place]) 05/07/2020   Plantar wart 05/07/2020   Sleep apnea 05/07/2020   Urinary incontinence 93/26/7124   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   Family history of genetic disease carrier    Family history of breast cancer    Family history of colon cancer    History of multiple concussions 11/20/2017   Ataxia 11/20/2017   Insomnia 02/17/2015   Right shoulder pain 12/31/2014   Episodic cluster headache, not intractable 12/02/2014   Chronic paroxysmal hemicrania, 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   Photophobia of both eyes 05/04/2014   Nausea with vomiting 05/04/2014   Bipolar I disorder, most recent episode mixed ([place]) 04/18/2014   Suicidal ideation 04/12/2014   Injury of right shoulder and upper arm 02/17/2014   Migraine with status migrainosus 01/08/2013   Chronic migraine 05/08/2012   Contact dermatitis 11/27/2011   Generalized anxiety disorder 10/27/2011   ADHD (attention deficit hyperactivity disorder), 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     amphetamine-dextroamphetamine (ADDERALL XR) 20 MG 24 hr capsule 1 capsule in the morning     Ascorbic Acid (VITAMIN C) 1000 MG tablet Take 1,000 mg by mouth daily.     augmented betamethasone dipropionate (DIPROLENE-AF) 0.05 % cream Apply topically.     calcium carbonate (OSCAL) 1500 (600 Ca) MG TABS tablet in the morning and at bedtime.     CALCIUM PO Take 600 mg by mouth in the morning and at bedtime.     Cholecalciferol (VITAMIN D3 PO) Take 50 mcg by mouth daily.     Cyanocobalamin (B-12 PO) Take 5,000 mcg by mouth daily.     cyclobenzaprine (FLEXERIL) 10 MG tablet Take 1 tablet by mouth 3 (three) times daily.     divalproex (DEPAKOTE ER) 500 MG 24 hr tablet TAKE 2 TABLETS BY MOUTH AT BEDTIME FOR MOOD 60 tablet 5   doxycycline (VIBRAMYCIN) 100 MG capsule Take 100 mg by mouth 2 (two) times daily.     FLUARIX QUADRIVALENT 0.5 ML injection      Galcanezumab-gnlm (EMGALITY) 120 MG/ML SOSY Inject 120 mg into the skin every 30 (thirty) days. 1.12 mL 11   hydrOXYzine (VISTARIL) 50 MG capsule Take 50 mg by mouth in the morning and at bedtime.  2   lamoTRIgine (LAMICTAL) 150 MG tablet Take 300 mg by mouth at bedtime.  1   LATUDA 20 MG TABS tablet Take 20 mg by mouth every morning.     loratadine (CLARITIN) 10 MG tablet Take 10 mg by mouth daily.     lurasidone (LATUDA) 80 MG TABS tablet Take 1 tablet (80 mg total) by mouth daily with supper. For mood control 30 tablet 0   metoprolol succinate (TOPROL-XL) 100 MG 24 hr tablet Take 1 tablet (100 mg total) by mouth daily. Take with or immediately following a meal. 30 tablet 0   Multiple Vitamin (MULTIVITAMIN WITH MINERALS) TABS tablet Take 1 tablet by mouth daily.     Multiple Vitamins-Minerals (ZINC PO) Take 50 mg by mouth daily.     mupirocin ointment (BACTROBAN) 2 %  Apply topically.     Omega-3 Fatty Acids (OMEGA 3 PO) Take by mouth.     promethazine (PHENERGAN) 25 MG tablet Take 1 tablet (25 mg total) by mouth every 8 (eight) hours as needed for nausea or vomiting. TK 1 T PO BID FOR 15 DAYS PRN 30 tablet 0   SUMAtriptan 6 MG/0.5ML SOAJ Inject into the skin.     SUMAtriptan Succinate Refill 6 MG/0.5ML SOCT Inject 0.5 ml SQ at the onset of migraine. May repeat in 2 hours if needed. 0.5 mL 5   traZODone (DESYREL) 100 MG tablet Take 100 mg by mouth at bedtime.     vitamin B-12 (CYANOCOBALAMIN) 100 MCG tablet 1 tablet     Zinc 50 MG TABS 1 tablet     Current Facility-Administered Medications on File Prior to Visit  Medication Dose Route Frequency Provider Last Rate Last Admin   methylPREDNISolone acetate (DEPO-MEDROL) injection 40 mg  40 mg Intra-articular Once Hudnall, Sharyn Lull, MD       valproate (DEPACON) 1,000 mg in dextrose 5 % 50 mL IVPB  1,000 mg Intravenous Once Dohmeier, Asencion Partridge, MD        Allergies  Allergen Reactions   Adhesive [Tape] Itching and Rash    Also reacted to Steri Strips and Band-Aids.   Dilaudid [Hydromorphone Hcl] Itching   Morphine Nausea And Vomiting   Penicillins Hives    Has patient had a PCN reaction causing immediate rash, facial/tongue/throat swelling, SOB or lightheadedness with hypotension: YES Has patient had a PCN reaction causing severe rash involving mucus membranes or skin necrosis: NO Has patient had a PCN reaction that required hospitalization NO Has patient had a PCN reaction occurring within the last 10 years:NO If all of the above answers are "NO", then may proceed with Cephalosporin use.   Percocet [Oxycodone-Acetaminophen] Itching   Prednisone Hives   Provera [Medroxyprogesterone Acetate] Other (See Comments)    Causes manic episodes   Ultram [Tramadol Hcl] Itching    Review of Systems No fevers, chills, nausea, muscle aches, no difficulty breathing, no calf pain, no chest pain or shortness of  breath.   Physical Exam  GENERAL APPEARANCE: Alert, conversant. Appropriately groomed. No acute distress.   VASCULAR: Pedal pulses palpable DP and PT bilateral.  Capillary refill time is immediate to all digits,  Proximal to distal cooling it warm to warm.  Digital perfusion adequate.   NEUROLOGIC: sensation is intact to 5.07 monofilament at 5/5 sites bilateral.  Light touch is intact bilateral, vibratory sensation intact bilateral  MUSCULOSKELETAL: acceptable muscle strength, tone and stability bilateral.  No gross boney pedal deformities noted.  Pain along the medial arch along the plantar fascia  in the central medial band bilateral.  Some pain at the proximal insertion site of the plantar fascia noted bilateral.  Pain along the posterior tibial tendon of the right foot is also palpated at todays visit.   DERMATOLOGIC: skin is warm, supple, and dry.  No open lesions noted.  No rash, no pre ulcerative lesions. Digital nails are asymptomatic.      Assessment   1. Plantar fasciitis   2. Tendonitis      Plan  Discussed treatment options and alternatives.  I recommended trying another injection for the plantar fasciitis and the patient agreed.  I injected both heels with 29m kenalog and lidocaine plain bilateral.  She did very well with the injections.  I also dispensed a short air fracture walker to wear on the right foot when up and active on the foot-  no driving in the boot.  She will try this for 4 or so weeks and will return for a recheck of both feet.  Stretching exercises are also dispensed for her use.  If any concerns arise she will call.

## 2020-08-11 ENCOUNTER — Encounter: Payer: Self-pay | Admitting: Podiatrist

## 2020-08-11 DIAGNOSIS — M722 Plantar fascial fibromatosis: Secondary | ICD-10-CM | POA: Diagnosis not present

## 2020-08-11 DIAGNOSIS — G43001 Migraine without aura, not intractable, with status migrainosus: Secondary | ICD-10-CM | POA: Diagnosis not present

## 2020-08-11 DIAGNOSIS — F319 Bipolar disorder, unspecified: Secondary | ICD-10-CM | POA: Diagnosis not present

## 2020-08-11 DIAGNOSIS — E669 Obesity, unspecified: Secondary | ICD-10-CM | POA: Diagnosis not present

## 2020-08-11 DIAGNOSIS — M779 Enthesopathy, unspecified: Secondary | ICD-10-CM | POA: Diagnosis not present

## 2020-08-11 DIAGNOSIS — I10 Essential (primary) hypertension: Secondary | ICD-10-CM | POA: Diagnosis not present

## 2020-08-11 DIAGNOSIS — Z0000 Encounter for general adult medical examination without abnormal findings: Secondary | ICD-10-CM | POA: Diagnosis not present

## 2020-08-11 MED ORDER — TRIAMCINOLONE ACETONIDE 10 MG/ML IJ SUSP
10.0000 mg | Freq: Once | INTRAMUSCULAR | Status: AC
  Administered 2020-08-11: 10 mg

## 2020-08-17 ENCOUNTER — Other Ambulatory Visit: Payer: Self-pay | Admitting: Adult Health

## 2020-08-18 ENCOUNTER — Other Ambulatory Visit: Payer: Self-pay

## 2020-08-18 ENCOUNTER — Ambulatory Visit (INDEPENDENT_AMBULATORY_CARE_PROVIDER_SITE_OTHER): Payer: PPO

## 2020-08-18 ENCOUNTER — Ambulatory Visit: Payer: PPO | Admitting: Podiatry

## 2020-08-18 DIAGNOSIS — M84374A Stress fracture, right foot, initial encounter for fracture: Secondary | ICD-10-CM

## 2020-08-18 MED ORDER — MELOXICAM 15 MG PO TABS
15.0000 mg | ORAL_TABLET | Freq: Every day | ORAL | 1 refills | Status: DC
Start: 2020-08-18 — End: 2020-09-06

## 2020-08-18 NOTE — Progress Notes (Signed)
   HPI: 50 y.o. female presenting today for a new complaint regarding an injury that the patient sustained yesterday evening.  Patient states that she dropped a stack of books on her right foot.  It is very very painful today and she came in as an urgent work in appointment to have it evaluated.  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  Dermatology: Skin is warm, dry and supple bilateral lower extremities. Negative for open lesions or macerations.  Vascular: Palpable pedal pulses bilaterally. No edema or erythema noted. Capillary refill within normal limits.  Neurological: Epicritic and protective threshold grossly intact  bilaterally.   Musculoskeletal Exam: No pedal deformities noted.  Exquisite tenderness to palpation overlying the second metatarsal right foot suspicious for stress fracture  Radiographic Exam:  Normal osseous mineralization. Joint spaces preserved.  There is some very subtle irregularity at the diaphysis of the second metatarsal of the right foot.  Transverse radiopacity noted with suspicious cortical irregularities about the diaphysis.  Assessment: 1.  Suspect stress fracture second metatarsal right 2.  Plantar fasciitis right foot; currently asymptomatic   Plan of Care:  1. Patient evaluated. X-Rays reviewed.  2.  Tall cam boot dispensed.  Weightbearing as tolerated 3.  Prescription for meloxicam 15 mg 4.  Patient has follow-up appointment with Dr. Paulla Dolly on 09/08/2020.  Patient will need follow-up x-rays to evaluate the stress fracture to the second metatarsal right      Edrick Kins, DPM [facility]  Dr. Edrick Kins, DPM    [address], [place] [address]                Office [phone]  Fax [phone]

## 2020-08-23 ENCOUNTER — Telehealth: Payer: Self-pay | Admitting: Adult Health

## 2020-08-23 ENCOUNTER — Encounter: Payer: Self-pay | Admitting: Adult Health

## 2020-08-23 MED ORDER — SUMATRIPTAN SUCCINATE 6 MG/0.5ML ~~LOC~~ SOAJ: SUBCUTANEOUS | 0 refills | Status: DC

## 2020-08-23 NOTE — Telephone Encounter (Signed)
See phone note where call addressed.

## 2020-08-23 NOTE — Addendum Note (Signed)
Addended by: Brandon Melnick on: 08/23/2020 05:06 PM   Modules accepted: Orders

## 2020-08-23 NOTE — Telephone Encounter (Signed)
[name],  The patient may just for the next 5 days increase the Depakote dose by 100% and hopefully control the migraines with that.  No fever, sore throat, nausea?

## 2020-08-23 NOTE — Telephone Encounter (Signed)
I called pt and she had 2 episodes of drop spells (feelings of dropping like she is going to hit the ground).  Once at work Friday then another today while sitting.  She stated that Friday episode lasted about 1/2 hour, had some nausea then sharp shooting pains to left side of head for then next 2 hours.  Had some migraine, headache after that.  She has been staying hydrated and eating consistently.  Her episode today was same feeling, with some headache/nausea as well. She has not taken anything more, she did not have imitrex inj available.  Can refill.   I explained MM/NP is out this week and I would be glad to relay to CD/MD to receommendation.

## 2020-08-23 NOTE — Telephone Encounter (Signed)
Pt states she had a very bad episode Friday, she felt as if the floor fell from under her, she said that lasted about 30 mins.  Pt states that night for about 4 hours on the left side of her head she had sharp pain.  Pt also states all weekend she had a slight migraine on and off.  Please call pt to discuss.

## 2020-08-23 NOTE — Telephone Encounter (Signed)
Pt has called stating that she has not heard back from [facility] just yet.  Pt was informed that there is an allowance of 24-48 hours for messages to be responded to.  Pt asked when did the policy changed because within the 20 years that she has been a pt here she has never been told that.  Pt was advised that since she has an active my chart she is welcome to send a my chart message.  Pt asked for phone reps name and said she would take care of this matter. This is FYI.

## 2020-08-24 NOTE — Telephone Encounter (Addendum)
Spoke w/ Dr. Brett Fairy. She would like patient to increase her depakote from 2-'500mg'$  tabs at bed (total '1000mg'$  qhs) o 4-'500mg'$  tabs at bed (total '2000mg'$ ) for 5 days to see if this helps.  I called pt. Relayed Dr. Edwena Felty recommendation. She is agreeable to plan. She will call back if sx persist.

## 2020-08-24 NOTE — Telephone Encounter (Signed)
I called and LMVM for pt about plan. Pt was being spoken to by EJ/RN with pt about plan as well.

## 2020-09-01 ENCOUNTER — Encounter: Payer: Self-pay | Admitting: Podiatry

## 2020-09-01 ENCOUNTER — Other Ambulatory Visit: Payer: Self-pay

## 2020-09-01 ENCOUNTER — Ambulatory Visit (INDEPENDENT_AMBULATORY_CARE_PROVIDER_SITE_OTHER): Payer: PPO

## 2020-09-01 ENCOUNTER — Ambulatory Visit: Payer: PPO | Admitting: Podiatry

## 2020-09-01 DIAGNOSIS — S99921D Unspecified injury of right foot, subsequent encounter: Secondary | ICD-10-CM | POA: Diagnosis not present

## 2020-09-01 DIAGNOSIS — M84374A Stress fracture, right foot, initial encounter for fracture: Secondary | ICD-10-CM

## 2020-09-01 NOTE — Progress Notes (Signed)
Subjective:   Patient ID: [name], female   DOB: 50 y.o.   MRN: [id number]   HPI Patient presents stating that her heel and arch seem to be doing better but she still having a lot of pain on top of her right foot and is wearing a walking boot due to the discomfort.  Patient states it is still very sore on top of her foot neuro   ROS      Objective:  Physical Exam  Vascular status intact with patient found to have inflammation pain of the dorsum right foot and the extensor complex and distal with moderate swelling secondary to trauma and also has diminished discomfort in the plantar fashion     Assessment:  Possibility for fracture of the dorsal right foot or stress fracture or simply just soft tissue trauma with Planter fasciitis also noted     Plan:  H&P reviewed both conditions and went ahead today recommended ice continue boot usage anti-inflammatories topical Voltaren gel and patient will be seen back to recheck as needed.  Encouraged her to continue boot for several more weeks  X-rays indicate suspicion of fracture of the lesser metatarsal nothing obvious at the current time

## 2020-09-06 ENCOUNTER — Ambulatory Visit: Payer: PPO | Admitting: Adult Health

## 2020-09-06 ENCOUNTER — Other Ambulatory Visit: Payer: Self-pay

## 2020-09-06 ENCOUNTER — Encounter: Payer: Self-pay | Admitting: Adult Health

## 2020-09-06 VITALS — BP 129/89 | HR 81 | Ht 69.0 in | Wt 273.4 lb

## 2020-09-06 DIAGNOSIS — F32A Depression, unspecified: Secondary | ICD-10-CM

## 2020-09-06 DIAGNOSIS — R42 Dizziness and giddiness: Secondary | ICD-10-CM

## 2020-09-06 DIAGNOSIS — F3160 Bipolar disorder, current episode mixed, unspecified: Secondary | ICD-10-CM

## 2020-09-06 NOTE — Telephone Encounter (Addendum)
I called pt and made her appt today at 1430 with MM/NP.

## 2020-09-06 NOTE — Progress Notes (Signed)
PATIENT: [name] DOB: 11-16-1970  REASON FOR VISIT: follow up HISTORY FROM: patient Primary neurologist: Dr. Brett Fairy  HISTORY OF PRESENT ILLNESS: Today 09/06/20:  [name] is a 50 year old female with a history of postconcussive syndrome, migraine headaches and dizziness.  She returns today for follow-up.  She states that she has been having more dizziness and falls.  She has not been doing the exercises that she was started vestibular rehab.  She states one of the fall she tripped over bricks.  Other falls occur because she feels dizzy.  She also states that she has been under some stress from her mother, brother and a Mudlogger.  In the past the patient has had issues with suicidal thoughts she states that she has not had any suicidal thoughts but she feels like these events could cause her to have those thoughts.  She returns today for an evaluation.  07/19/20:[name] is a 50 year old female with a history of postconcussive syndrome, migraine headaches, dizziness and memory disturbance.  She returns today for follow-up.  She states that she is only having approximately 1 migraine a week.  She typically can take Tylenol and it resolves fairly quickly.  She does have sumatriptan if needed but she has not had to use this.  She did go to physical therapy for vestibular rehab.  She states that her episodes of dizziness has improved.  They are spontaneous and do not occur daily.  No episode has lasted longer than 10 minutes.  They typically only last several minutes.  She also reports that at night she is having what she believes is restless legs.  She states that her legs start jumping and she has a trouble falling asleep.  Fortunately it does not occur every night but does occur most nights.  She has never had any formal work-up for restless leg symptoms.  02/05/20: [name] is a 50 year old female with a history of postconcussive syndrome, migraine headaches and memory disturbance.   She returns today for follow-up.  She was just seen on 1216 and was doing well.  She states that in the last 2 weeks she has been having dizziness that she describes as the room spinning, feeling as if she may pass out during these episodes.  She also describes it as feeling as if she is on an elevator and the floor gives out.  She states in the last week the episodes have increased to at least every other day.  The episodes are typically brief and reports that she can typically hold onto the counter and it slowly resolves.  She states that she had the same symptoms when she was diagnosed with a concussion.  She went to vestibular rehab and they resolved.  She states that she did fall into her refrigerator 3 weeks ago but no significant injuries.  She states that she recently was at [facility] and bent down to pick something up off a shelf and hit her head on a cart.   01/15/20: [name] is a 50 year old female with a history of postconcussive syndrome, migraine headaches and memory disturbance.  She returns today for follow-up.  At the last visit she was referred to neuropsychology for memory evaluation.  They felt that her memory disturbance was due to ADHD.  It was recommended that she be put back on stimulant medication.  She reports that her psychiatrist did put her back on Adderall.  She has found it somewhat beneficial.  She continues to struggle some  at work with inputting information.  Migraines have been controlled on Emgality and Depakote.  She returns today for an evaluation.  07/21/19: [name] is a 50 year old female with a history of postconcussive syndrome and migraine headaches.  She returns today for follow-up.  She states that she has been seeing speech therapy and feels that it has helped some.  She reports her therapist has mentioned additional testing with neuropsychology.  Patient states that she works at [employer].  States that she had a customer recently who was on the phone while checking  out.  She states that she became very overwhelmed over this incident.  She states that she also has occasions where she forgets what she goes into a room for.  Often she does remember it later.  She states that she often loses her train of thought in mid sentence.  Or may not be able to find the right word to say.  She also states when she is working if a customer gives her her email address she has a hard time processing it.  She was on Adderall when she was in school.  She states that her psychiatrist stopped this when school ended.  She is not been on this medication for quite some time.  She returns today for an evaluation.  HISTORY 02/17/19:   [name] is a 50 year old female with a history of postconcussive syndrome and migraine headaches.  She returns today for follow-up.  The patient reports that Toradol injection resolved her headache.  She has not had a headache since then.  She continues on Depakote and Emgality.  Her next injection is February 10 for [employer].  She continues to have chronic dizziness.  She also reports some memory issues but is unsure if it is just "brain fog" she states on occasion she will also drop things but this has been ongoing.  The patient had a CT of the head back in 2019 after hitting her head that was unremarkable.  She returns today for an evaluation.  REVIEW OF SYSTEMS: Out of a complete 14 system review of symptoms, the patient complains only of the following symptoms, and all other reviewed systems are negative.  See HPI  ALLERGIES: Allergies  Allergen Reactions   Adhesive [Tape] Itching and Rash    Also reacted to Steri Strips and Band-Aids.   Dilaudid [Hydromorphone Hcl] Itching   Morphine Nausea And Vomiting   Penicillins Hives    Has patient had a PCN reaction causing immediate rash, facial/tongue/throat swelling, SOB or lightheadedness with hypotension: YES Has patient had a PCN reaction causing severe rash involving mucus membranes or skin  necrosis: NO Has patient had a PCN reaction that required hospitalization NO Has patient had a PCN reaction occurring within the last 10 years:NO If all of the above answers are "NO", then may proceed with Cephalosporin use.   Percocet [Oxycodone-Acetaminophen] Itching   Prednisone Hives   Provera [Medroxyprogesterone Acetate] Other (See Comments)    Causes manic episodes   Ultram [Tramadol Hcl] Itching    HOME MEDICATIONS: Outpatient Medications Prior to Visit  Medication Sig Dispense Refill   ALFALFA PO Take 500 mg by mouth daily.     amphetamine-dextroamphetamine (ADDERALL XR) 20 MG 24 hr capsule 1 capsule in the morning     Ascorbic Acid (VITAMIN C) 1000 MG tablet Take 1,000 mg by mouth daily.     augmented betamethasone dipropionate (DIPROLENE-AF) 0.05 % cream Apply topically.     calcium carbonate (OSCAL) 1500 (600  Ca) MG TABS tablet in the morning and at bedtime.     CALCIUM PO Take 600 mg by mouth in the morning and at bedtime.     Cholecalciferol (VITAMIN D3 PO) Take 50 mcg by mouth daily.     Cyanocobalamin (B-12 PO) Take 5,000 mcg by mouth daily.     cyclobenzaprine (FLEXERIL) 10 MG tablet Take 1 tablet by mouth 3 (three) times daily.     divalproex (DEPAKOTE ER) 500 MG 24 hr tablet TAKE 2 TABLETS BY MOUTH AT BEDTIME FOR MOOD 60 tablet 5   doxycycline (VIBRAMYCIN) 100 MG capsule Take 100 mg by mouth 2 (two) times daily.     FLUARIX QUADRIVALENT 0.5 ML injection      Galcanezumab-gnlm (EMGALITY) 120 MG/ML SOSY Inject 120 mg into the skin every 30 (thirty) days. 1.12 mL 11   hydrOXYzine (VISTARIL) 50 MG capsule Take 50 mg by mouth in the morning and at bedtime.   2   lamoTRIgine (LAMICTAL) 150 MG tablet Take 300 mg by mouth at bedtime.  1   LATUDA 20 MG TABS tablet Take 20 mg by mouth every morning.     loratadine (CLARITIN) 10 MG tablet Take 10 mg by mouth daily.     lurasidone (LATUDA) 80 MG TABS tablet Take 1 tablet (80 mg total) by mouth daily with supper. For mood control  30 tablet 0   meloxicam (MOBIC) 15 MG tablet Take 1 tablet (15 mg total) by mouth daily. 30 tablet 1   metoprolol succinate (TOPROL-XL) 100 MG 24 hr tablet Take 1 tablet (100 mg total) by mouth daily. Take with or immediately following a meal. 30 tablet 0   Multiple Vitamin (MULTIVITAMIN WITH MINERALS) TABS tablet Take 1 tablet by mouth daily.     Multiple Vitamins-Minerals (ZINC PO) Take 50 mg by mouth daily.     mupirocin ointment (BACTROBAN) 2 % Apply topically.     Omega-3 Fatty Acids (OMEGA 3 PO) Take by mouth.     promethazine (PHENERGAN) 25 MG tablet Take 1 tablet (25 mg total) by mouth every 8 (eight) hours as needed for nausea or vomiting. TK 1 T PO BID FOR 15 DAYS PRN 30 tablet 0   SUMAtriptan 6 MG/0.5ML SOAJ Inject 0.2m onset of migraine and may repeat in 2 hours as needed (max 139m/ 24 hours). 16 mL 0   SUMAtriptan Succinate Refill 6 MG/0.5ML SOCT Inject 0.5 ml SQ at the onset of migraine. May repeat in 2 hours if needed. 0.5 mL 5   traZODone (DESYREL) 100 MG tablet Take 100 mg by mouth at bedtime.     vitamin B-12 (CYANOCOBALAMIN) 100 MCG tablet 1 tablet     Zinc 50 MG TABS 1 tablet     Facility-Administered Medications Prior to Visit  Medication Dose Route Frequency Provider Last Rate Last Admin   methylPREDNISolone acetate (DEPO-MEDROL) injection 40 mg  40 mg Intra-articular Once Hudnall, ShSharyn LullMD       valproate (DEPACON) 1,000 mg in dextrose 5 % 50 mL IVPB  1,000 mg Intravenous Once Dohmeier, CaAsencion PartridgeMD        PAST MEDICAL HISTORY: Past Medical History:  Diagnosis Date   Achilles tendinitis    ADHD (attention deficit hyperactivity disorder), inattentive type    Diagnosed as an adult after starting college   Allergy    Arthritis    Bipolar I disorder 04/18/2014   most recent episode mixed   Borderline personality disorder 10/27/2011   Cataracts, bilateral  Chronic pain disorder    due to several injuries affecting numerous areas of her body throughout the years    Chronic paroxysmal hemicrania, 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   rt chest wall nodule removal   GANGLION CYST EXCISION  2011   lipoma removal     right bunioectomy     SHOULDER SURGERY  01/13/2011   right, partial tear   tumor resection left thigh      FAMILY HISTORY: Family History  Problem Relation Age of Onset   Hypertension Mother    Hyperlipidemia Mother    Breast cancer Mother 41       genetic testing- CHEK2 likely pathogenic variant   Endometrial cancer Mother    Heart attack Father    Heart disease Father    Hypertension Father    Bipolar disorder Father    Diabetes Paternal Grandfather    Heart disease Maternal Aunt    Breast cancer Maternal Aunt 78   Heart disease Maternal Grandmother    Colon cancer Maternal Grandmother 66   Colon cancer Maternal Uncle 75   Lymphoma Maternal Grandfather 84   Breast cancer Maternal Aunt 65   Breast cancer Maternal Aunt 35   Breast cancer Maternal Aunt    Bone cancer Cousin 45   Cervical cancer Cousin        Genetic testing- 'was positive for a gene' had a mastectomy    SOCIAL HISTORY: Social History   Socioeconomic History    Marital status: Single    Spouse name: Not on file   Number of children: 0   Years of education: 16   Highest education level: Bachelor's degree (e.g., BA, AB, BS)  Occupational History    Employer: [employer]  Tobacco Use   Smoking status: Never   Smokeless tobacco: Never  Substance and Sexual Activity   Alcohol use: No    Alcohol/week: 0.0 standard drinks   Drug use: No   Sexual activity: Never    Birth control/protection: None  Other Topics Concern   Not on file  Social History Narrative   Caffeine  2 sodas daily, 1 cup coffee daily.   Social Determinants of Health   Financial Resource Strain: Not on file  Food Insecurity: Not on file  Transportation Needs: Not on file  Physical Activity: Not on file  Stress: Not on file  Social Connections: Not on file  Intimate Partner Violence: Not on file      PHYSICAL EXAM  Vitals:   09/06/20 1435  BP: 129/89  Pulse: 81  Weight: 273 lb 6.4 oz (124 kg)  Height: 5' 9"  (1.753 m)    Body mass index is 40.37 kg/m.  Generalized: Well developed, in no acute distress   Neurological examination  Mentation: Alert oriented to time, place, history taking. Follows all commands speech and language fluent Cranial nerve II-XII: Pupils were equal round reactive to light. Extraocular movements were full, visual field were full on confrontational test.  Head turning and shoulder shrug  were normal and symmetric. Motor: The motor testing reveals 5 over 5 strength of all 4 extremities. Good symmetric motor tone is noted throughout.  Sensory: Sensory testing is intact to soft touch on all 4 extremities. No evidence of extinction is noted.  Coordination: Cerebellar testing reveals good finger-nose-finger and heel-to-shin bilaterally.  Gait and station: Gait is slightly unsteady.  Tandem gait is unsteady.  Romberg is negative but unsteady Reflexes: Deep tendon reflexes are symmetric and normal bilaterally.   DIAGNOSTIC DATA (LABS, IMAGING,  TESTING) - I reviewed patient records, labs, notes, testing and imaging myself where available.  Lab Results  Component Value Date   WBC 7.2 07/19/2020   HGB 14.2 07/19/2020   HCT 43.1 07/19/2020   MCV 90 07/19/2020   PLT 246 07/19/2020      Component Value Date/Time   NA 140 07/19/2020 1004   K 4.5 07/19/2020 1004   CL 101 07/19/2020 1004   CO2 25 07/19/2020 1004   GLUCOSE 78 07/19/2020 1004   GLUCOSE 92 05/08/2019 1200   BUN 14 07/19/2020 1004   CREATININE 0.93 07/19/2020 1004   CREATININE 0.83 09/11/2011 1524   CALCIUM 9.7 07/19/2020 1004   PROT 6.1 07/19/2020 1004   ALBUMIN 4.3 07/19/2020 1004   AST 18 07/19/2020 1004   ALT 13 07/19/2020 1004   ALKPHOS 93 07/19/2020 1004   BILITOT 0.3 07/19/2020 1004   GFRNONAA 60 01/15/2020 0948   GFRAA 69 01/15/2020 0948   Lab Results  Component Value Date   CHOL 198 12/29/2016   HDL 53 12/29/2016   LDLCALC 118 (H) 12/29/2016   TRIG 136 12/29/2016   CHOLHDL 3.7 12/29/2016   Lab Results  Component Value Date   HGBA1C 5.1 12/29/2016   Lab Results  Component Value Date   VITAMINB12 >2000 (H) 07/19/2020   Lab Results  Component Value Date   TSH 4.288 12/29/2016      ASSESSMENT AND PLAN 50 y.o. year old female  has a past medical history of Achilles tendinitis, ADHD (attention deficit hyperactivity disorder), inattentive type, Allergy, Arthritis, Bipolar I disorder (04/18/2014), Borderline personality disorder (10/27/2011), Cataracts, bilateral, Chronic pain disorder, Chronic paroxysmal hemicrania, not intractable (12/02/2014), Eczema, Episodic cluster headache, not intractable (12/02/2014), Family history of genetic disease carrier, Ganglion cyst (09/29/2009), Generalized anxiety disorder (10/27/2011), History of multiple concussions, Hyperprolactinemia, Hypertension, Insomnia (02/17/2015), Lipoma, Malignant tumor of muscle (09/02/2010), Migraine with status migrainosus (11/65/7903), Monoallelic mutation of CHEK2 gene in female  patient (02/27/2018), Pes planus, and Photophobia of both eyes (05/04/2014). here with:  Vertigo  Stressed to the patient the importance of doing the exercises for vertigo at home to avoid episodes of dizziness. After being consistent with the exercises the dizziness continues she will let us know  Depression-history of thoughts of suicide  Had a long discussion with the patient, she discussed current stressors in her life.  She has a follow-up appointment tomorrow with her psychiatrist encouraged her to keep this appointment.  Also encouraged that if she began to have thoughts of suicide she should go to the emergency room or call 911  Follow-up in 6 months or sooner if needed    Ward Givens, MSN, NP-C 09/06/2020, 2:19 PM [facility] [address], [place], [place] [address] [phone]

## 2020-09-06 NOTE — Telephone Encounter (Signed)
Pt called, still falling, dizziness, unsteadiness. Had a fall Friday, 7/29, Saturday, 7/30. Would like a call from the nurse.

## 2020-09-06 NOTE — Patient Instructions (Signed)
Your Plan:  Try the rehab exercises at home if dizziness does not improve please let me know If your symptoms worsen or you develop new symptoms please let us know.   Thank you for coming to see [place] at [facility]. I hope we have been able to provide you high quality care today.  You may receive a patient satisfaction survey over the next few weeks. We would appreciate your feedback and comments so that we may continue to improve ourselves and the health of our patients.

## 2020-09-08 ENCOUNTER — Ambulatory Visit: Payer: PPO | Admitting: Podiatry

## 2020-09-11 DIAGNOSIS — S93491A Sprain of other ligament of right ankle, initial encounter: Secondary | ICD-10-CM | POA: Diagnosis not present

## 2020-09-13 ENCOUNTER — Ambulatory Visit: Payer: PPO | Admitting: Family Medicine

## 2020-09-13 ENCOUNTER — Ambulatory Visit: Payer: Self-pay

## 2020-09-13 ENCOUNTER — Other Ambulatory Visit: Payer: Self-pay

## 2020-09-13 VITALS — Ht 69.5 in | Wt 273.0 lb

## 2020-09-13 DIAGNOSIS — M25571 Pain in right ankle and joints of right foot: Secondary | ICD-10-CM

## 2020-09-13 DIAGNOSIS — M24159 Other articular cartilage disorders, unspecified hip: Secondary | ICD-10-CM | POA: Diagnosis not present

## 2020-09-13 DIAGNOSIS — M76821 Posterior tibial tendinitis, right leg: Secondary | ICD-10-CM

## 2020-09-13 NOTE — Patient Instructions (Signed)
Nice to meet you Please try the exercises  Please try the boot  Please try the celebrex   Please send me a message in MyChart with any questions or updates.  Please see me back in 2 weeks.   --Dr. Raeford Razor

## 2020-09-13 NOTE — Progress Notes (Signed)
[name] - 50 y.o. female MRN [id number]  Date of birth: 1970/05/17  SUBJECTIVE:  Including CC & ROS.  No chief complaint on file.   [name] is a 50 y.o. female that is presenting with right hip pain and right ankle pain.  Her right hip pain started yesterday.  She is having significant pain or swelling her pain to bend over.  Denies any injury or inciting event.  Had an injury where she had an eversion movement of the right ankle.  Now the pain is occurring over the medial compartment of the ankle.  Has a history of Planter fasciitis in the same foot has had some swelling and pain medially.  She was seen at the [facility] urgent care and x-rays were negative.    Review of Systems See HPI   HISTORY: Past Medical, Surgical, Social, and Family History Reviewed & Updated per EMR.   Pertinent Historical Findings include:  Past Medical History:  Diagnosis Date  . Achilles tendinitis   . ADHD (attention deficit hyperactivity disorder), inattentive type    Diagnosed as an adult after starting college  . Allergy   . Arthritis   . Bipolar I disorder 04/18/2014   most recent episode mixed  . Borderline personality disorder 10/27/2011  . Cataracts, bilateral   . Chronic pain disorder    due to several injuries affecting numerous areas of her body throughout the years  . 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 Eczema   . Episodic cluster headache, not intractable 12/02/2014  . Family history of genetic disease carrier   . Ganglion cyst 09/29/2009   left wrist (2 cyst)  . Generalized anxiety disorder 10/27/2011  . History of multiple concussions    October 2018 and September 2019  . Hyperprolactinemia   . Hypertension   . Insomnia 02/17/2015  . Lipoma   . 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 Migraine with status migrainosus 01/08/2013  . Monoallelic  mutation of CHEK2 gene in female patient 02/27/2018   CHEK2 [phone] (Intronic)  . Pes planus   . Photophobia of both eyes 05/04/2014    Past Surgical History:  Procedure Laterality Date  . ANKLE SURGERY  12/88   left   . chest nodule  1990?   rt chest wall nodule removal  . GANGLION CYST EXCISION  2011  . lipoma removal    . right bunioectomy    . SHOULDER SURGERY  01/13/2011   right, partial tear  . tumor resection left thigh      Family History  Problem Relation Age of Onset  . Hypertension Mother   . Hyperlipidemia Mother   . Breast cancer Mother 68       genetic testing- CHEK2 likely pathogenic variant  . Endometrial cancer Mother   . Heart attack Father   . Heart disease Father   . Hypertension Father   . Bipolar disorder Father   . Diabetes Paternal Grandfather   . Heart disease Maternal Aunt   . Breast cancer Maternal Aunt 65  . Heart disease Maternal Grandmother   . Colon cancer Maternal Grandmother 66  . Colon cancer Maternal Uncle 75  . Lymphoma Maternal Grandfather 69  . Breast cancer Maternal Aunt 65  . Breast cancer Maternal Aunt 35  . Breast cancer Maternal Aunt   . Bone cancer Cousin 50  . Cervical  cancer Cousin        Genetic testing- 'was positive for a gene' had a mastectomy    Social History   Socioeconomic History  . Marital status: Single    Spouse name: Not on file  . Number of children: 0  . Years of education: 16  . Highest education level: Bachelor's degree (e.g., BA, AB, BS)  Occupational History    Employer: [employer]  Tobacco Use  . Smoking status: Never  . Smokeless tobacco: Never  Substance and Sexual Activity  . Alcohol use: No    Alcohol/week: 0.0 standard drinks  . Drug use: No  . Sexual activity: Never    Birth control/protection: None  Other Topics Concern  . Not on file  Social History Narrative   Caffeine  2 sodas daily, 1 cup coffee daily.   Social Determinants of Health   Financial Resource Strain: Not on  file  Food Insecurity: Not on file  Transportation Needs: Not on file  Physical Activity: Not on file  Stress: Not on file  Social Connections: Not on file  Intimate Partner Violence: Not on file     PHYSICAL EXAM:  VS: Ht 5' 9.5" (1.765 m)   Wt 273 lb (123.8 kg)   BMI 39.74 kg/m  Physical Exam Gen: NAD, alert, cooperative with exam, well-appearing MSK:  Right ankle: Tenderness to palpation of the posterior tibialis. No swelling or ecchymosis. Normal strength resistance. Neurovascular intact  Limited ultrasound: Right ankle:  No effusion within the ankle joint. Anechoic change near the insertion of the posterior tibialis following the tendon sheath. No changes of the medial malleolus. Normal subtalar joint.  Summary: Effusion around the posterior tibialis  Ultrasound and interpretation by Clearance Coots, MD    ASSESSMENT & PLAN:   Posterior tibial tendinitis of right lower extremity Had an eversion injury of the ankle appears to have some changes around the posterior tibialis. -Counseled on home exercise therapy and supportive care. -Can go into the boot. -Could consider injection or physical therapy. -Provided work note  Labral tear of hip, degenerative Having pain in the groin and seems to have irritation of the joint. -Counseled on home exercise therapy and supportive care. -Could consider injection or physical therapy.

## 2020-09-13 NOTE — Progress Notes (Deleted)
[name] - 49 y.o. female MRN [id number]  Date of birth: 05/24/1970  SUBJECTIVE:  Including CC & ROS.  No chief complaint on file.   [name] is a 49 y.o. female that is  ***.  ***   Review of Systems See HPI   HISTORY: Past Medical, Surgical, Social, and Family History Reviewed & Updated per EMR.   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   rt chest wall nodule removal   GANGLION CYST EXCISION  2011   lipoma removal     right bunioectomy     SHOULDER SURGERY  01/13/2011   right, partial tear   tumor resection left thigh      Family History  Problem Relation Age of Onset   Hypertension  Mother    Hyperlipidemia Mother    Breast cancer Mother 75       genetic testing- CHEK2 likely pathogenic variant   Endometrial cancer Mother    Heart attack Father    Heart disease Father    Hypertension Father    Bipolar disorder Father    Diabetes Paternal Grandfather    Heart disease Maternal Aunt    Breast cancer Maternal Aunt 65   Heart disease Maternal Grandmother    Colon cancer Maternal Grandmother 66   Colon cancer Maternal Uncle 75   Lymphoma Maternal Grandfather 84   Breast cancer Maternal Aunt 65   Breast cancer Maternal Aunt 35   Breast cancer Maternal Aunt    Bone cancer Cousin 45   Cervical cancer Cousin        Genetic testing- 'was positive for a gene' had a mastectomy    Social History   Socioeconomic History   Marital status: Single    Spouse name: Not on file   Number of children: 0   Years of education: 16   Highest education level: Bachelor's degree (e.g., BA, AB, BS)  Occupational History    Employer: [employer]  Tobacco Use   Smoking status: Never   Smokeless tobacco: Never  Substance and Sexual Activity     Alcohol use: No    Alcohol/week: 0.0 standard drinks   Drug use: No   Sexual activity: Never    Birth control/protection: None  Other Topics Concern   Not on file  Social History Narrative   Caffeine  2 sodas daily, 1 cup coffee daily.   Social Determinants of Health   Financial Resource Strain: Not on file  Food Insecurity: Not on file  Transportation Needs: Not on file  Physical Activity: Not on file  Stress: Not on file  Social Connections: Not on file  Intimate Partner Violence: Not on file     PHYSICAL EXAM:  VS: There were no vitals taken for this visit. Physical Exam Gen: NAD, alert, cooperative with exam, well-appearing MSK:  ***      ASSESSMENT & PLAN:   No problem-specific Assessment & Plan notes found for this encounter.

## 2020-09-13 NOTE — Assessment & Plan Note (Addendum)
Had an eversion injury of the ankle appears to have some changes around the posterior tibialis. -Counseled on home exercise therapy and supportive care. -Can go into the boot. -Could consider injection or physical therapy. -Provided work note

## 2020-09-13 NOTE — Assessment & Plan Note (Signed)
Having pain in the groin and seems to have irritation of the joint. -Counseled on home exercise therapy and supportive care. -Could consider injection or physical therapy.

## 2020-09-14 ENCOUNTER — Ambulatory Visit: Payer: PPO | Admitting: Sports Medicine

## 2020-09-24 DIAGNOSIS — Z6841 Body Mass Index (BMI) 40.0 and over, adult: Secondary | ICD-10-CM | POA: Diagnosis not present

## 2020-09-24 DIAGNOSIS — R42 Dizziness and giddiness: Secondary | ICD-10-CM | POA: Diagnosis not present

## 2020-09-24 DIAGNOSIS — R55 Syncope and collapse: Secondary | ICD-10-CM | POA: Diagnosis not present

## 2020-09-24 DIAGNOSIS — R9431 Abnormal electrocardiogram [ECG] [EKG]: Secondary | ICD-10-CM | POA: Diagnosis not present

## 2020-09-24 DIAGNOSIS — W19XXXA Unspecified fall, initial encounter: Secondary | ICD-10-CM | POA: Diagnosis not present

## 2020-09-27 ENCOUNTER — Other Ambulatory Visit: Payer: Self-pay

## 2020-09-27 ENCOUNTER — Ambulatory Visit (HOSPITAL_BASED_OUTPATIENT_CLINIC_OR_DEPARTMENT_OTHER)
Admission: RE | Admit: 2020-09-27 | Discharge: 2020-09-27 | Disposition: A | Discharge status: Home / Self Care | Payer: PPO | Source: Ambulatory Visit | Attending: Family Medicine | Admitting: Family Medicine

## 2020-09-27 ENCOUNTER — Ambulatory Visit: Payer: PPO | Admitting: Family Medicine

## 2020-09-27 ENCOUNTER — Ambulatory Visit: Payer: Self-pay

## 2020-09-27 VITALS — Ht 69.5 in | Wt 277.0 lb

## 2020-09-27 DIAGNOSIS — M24159 Other articular cartilage disorders, unspecified hip: Secondary | ICD-10-CM | POA: Diagnosis not present

## 2020-09-27 DIAGNOSIS — M76821 Posterior tibial tendinitis, right leg: Secondary | ICD-10-CM | POA: Diagnosis not present

## 2020-09-27 DIAGNOSIS — M25551 Pain in right hip: Secondary | ICD-10-CM | POA: Diagnosis not present

## 2020-09-27 MED ORDER — TRIAMCINOLONE ACETONIDE 40 MG/ML IJ SUSP
40.0000 mg | Freq: Once | INTRAMUSCULAR | Status: AC
Start: 2020-09-27 — End: 2020-09-27
  Administered 2020-09-27: 40 mg via INTRA_ARTICULAR

## 2020-09-27 NOTE — Assessment & Plan Note (Signed)
Acutely worsening with intermittent severe pain and cramping. -Counseled on home exercise therapy and supportive care. -X-ray. -Injection today. -Could consider physical therapy.

## 2020-09-27 NOTE — Progress Notes (Signed)
[name] - 50 y.o. female MRN [id number]  Date of birth: April 21, 1970  SUBJECTIVE:  Including CC & ROS.  No chief complaint on file.   [name] is a 50 y.o. female that is presenting with worsening of the right hip pain.  The right ankle seems to be doing better.  She has been walking with a cane from time to time as the hip will seize up.  Has limited pain at the ankle   Review of Systems See HPI   HISTORY: Past Medical, Surgical, Social, and Family History Reviewed & Updated per EMR.   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   rt chest wall nodule removal   GANGLION CYST EXCISION  2011   lipoma  removal     right bunioectomy     SHOULDER SURGERY  01/13/2011   right, partial tear   tumor resection left thigh      Family History  Problem Relation Age of Onset   Hypertension Mother    Hyperlipidemia Mother    Breast cancer Mother 49       genetic testing- CHEK2 likely pathogenic variant   Endometrial cancer Mother    Heart attack Father    Heart disease Father    Hypertension Father    Bipolar disorder Father    Diabetes Paternal Grandfather    Heart disease Maternal Aunt    Breast cancer Maternal Aunt 27   Heart disease Maternal Grandmother    Colon cancer Maternal Grandmother 66   Colon cancer Maternal Uncle 75   Lymphoma Maternal Grandfather 84   Breast cancer Maternal Aunt 65   Breast cancer Maternal Aunt 35   Breast cancer Maternal Aunt    Bone cancer Cousin 45   Cervical cancer Cousin        Genetic testing- 'was positive for a gene' had a mastectomy    Social History   Socioeconomic History   Marital status: Single    Spouse name: Not on file   Number of children: 0   Years of education: 75  Highest education level: Bachelor's degree (e.g., BA, AB, BS)  Occupational History    Employer: [employer]  Tobacco Use   Smoking status: Never   Smokeless tobacco: Never  Substance and Sexual Activity   Alcohol use: No    Alcohol/week: 0.0 standard drinks   Drug use: No   Sexual activity: Never    Birth control/protection: None  Other Topics Concern   Not on file  Social History Narrative   Caffeine  2 sodas daily, 1 cup coffee daily.   Social Determinants of Health   Financial Resource Strain: Not on file  Food Insecurity: Not on file  Transportation Needs: Not on file  Physical Activity: Not on file  Stress: Not on file  Social Connections: Not on file  Intimate Partner Violence: Not on file     PHYSICAL EXAM:  VS: Ht 5' 9.5" (1.765 m)   Wt 277 lb (125.6 kg)   LMP 08/31/2020   BMI 40.32 kg/m  Physical Exam Gen: NAD, alert, cooperative with  exam, well-appearing    Aspiration/Injection Procedure Note [name] November 18, 1970  Procedure: Injection Indications: Right hip pain   Procedure Details Consent: Risks of procedure as well as the alternatives and risks of each were explained to the (patient/caregiver).  Consent for procedure obtained. Time Out: Verified patient identification, verified procedure, site/side was marked, verified correct patient position, special equipment/implants available, medications/allergies/relevent history reviewed, required imaging and test results available.  Performed.  The area was cleaned with iodine and alcohol swabs.    The right hip joint was injected using 3 cc of 1% lidocaine on a 22-gauge 3-1/2 inch needle.  The syringe was switched to mixture containing 1 cc's of 40 mg Kenalog and 4 cc's of 0.25% bupivacaine was injected.  Ultrasound was used. Images were obtained in short views showing the injection.     A sterile dressing was applied.  Patient did tolerate procedure well.      ASSESSMENT & PLAN:   Posterior tibial tendinitis of right lower extremity Doing relatively from that exam today.  No swelling or ecchymosis. -Counseled on home exercise therapy and supportive care. -Counseled on weaning out of the cam walker and using the ASO brace. -Could consider physical therapy  Labral tear of hip, degenerative Acutely worsening with intermittent severe pain and cramping. -Counseled on home exercise therapy and supportive care. -X-ray. -Injection today. -Could consider physical therapy.

## 2020-09-27 NOTE — Patient Instructions (Signed)
Good to see you Please try ice Please try the exercises  Please try to stop the boot and use the lace up strap   Please send me a message in MyChart with any questions or updates.  Please see me back in 4 weeks.   --Dr. Raeford Razor

## 2020-09-27 NOTE — Assessment & Plan Note (Signed)
Doing relatively from that exam today.  No swelling or ecchymosis. -Counseled on home exercise therapy and supportive care. -Counseled on weaning out of the cam walker and using the ASO brace. -Could consider physical therapy

## 2020-09-28 ENCOUNTER — Telehealth: Payer: Self-pay | Admitting: Family Medicine

## 2020-09-28 NOTE — Telephone Encounter (Signed)
Informed of results.   Rosemarie Ax, MD [facility] 09/28/2020, 2:44 PM

## 2020-10-05 ENCOUNTER — Telehealth: Payer: Self-pay | Admitting: Family Medicine

## 2020-10-05 NOTE — Telephone Encounter (Signed)
Patient left msg requesting Extention of previous work note for 2 addt'l weeks.  --Fowarding request to provider, pt to access note via mychart.  --glh

## 2020-10-05 NOTE — Telephone Encounter (Signed)
Provided work note.   Rosemarie Ax, MD [facility] 10/05/2020, 11:17 AM

## 2020-10-07 ENCOUNTER — Other Ambulatory Visit: Payer: Self-pay

## 2020-10-07 ENCOUNTER — Ambulatory Visit: Payer: PPO | Admitting: Family Medicine

## 2020-10-07 ENCOUNTER — Ambulatory Visit (HOSPITAL_BASED_OUTPATIENT_CLINIC_OR_DEPARTMENT_OTHER)
Admission: RE | Admit: 2020-10-07 | Discharge: 2020-10-07 | Disposition: A | Discharge status: Home / Self Care | Payer: PPO | Source: Ambulatory Visit | Attending: Family Medicine | Admitting: Family Medicine

## 2020-10-07 VITALS — Ht 69.5 in | Wt 277.0 lb

## 2020-10-07 DIAGNOSIS — M79645 Pain in left finger(s): Secondary | ICD-10-CM

## 2020-10-07 DIAGNOSIS — S63642A Sprain of metacarpophalangeal joint of left thumb, initial encounter: Secondary | ICD-10-CM | POA: Insufficient documentation

## 2020-10-07 NOTE — Assessment & Plan Note (Signed)
Had an injury yesterday.  Independent review of the x-ray today shows no bony changes. -Counseled on home exercise therapy and supportive care. -Thumb brace. -Could consider physical therapy.

## 2020-10-07 NOTE — Progress Notes (Signed)
[name] - 50 y.o. female MRN [id number]  Date of birth: 1970-12-23  SUBJECTIVE:  Including CC & ROS.  No chief complaint on file.   [name] is a 50 y.o. female that is presenting with acute left thumb pain.  She smashed her thumb yesterday.  She did this with a hammer.  Having ecchymosis and pain over the base of the thumb.   Review of Systems See HPI   HISTORY: Past Medical, Surgical, Social, and Family History Reviewed & Updated per EMR.   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   rt chest wall nodule removal   GANGLION CYST EXCISION  2011   lipoma removal     right bunioectomy      SHOULDER SURGERY  01/13/2011   right, partial tear   tumor resection left thigh      Family History  Problem Relation Age of Onset   Hypertension Mother    Hyperlipidemia Mother    Breast cancer Mother 7       genetic testing- CHEK2 likely pathogenic variant   Endometrial cancer Mother    Heart attack Father    Heart disease Father    Hypertension Father    Bipolar disorder Father    Diabetes Paternal Grandfather    Heart disease Maternal Aunt    Breast cancer Maternal Aunt 20   Heart disease Maternal Grandmother    Colon cancer Maternal Grandmother 66   Colon cancer Maternal Uncle 75   Lymphoma Maternal Grandfather 84   Breast cancer Maternal Aunt 65   Breast cancer Maternal Aunt 35   Breast cancer Maternal Aunt    Bone cancer Cousin 45   Cervical cancer Cousin        Genetic testing- 'was positive for a gene' had a mastectomy    Social History   Socioeconomic History   Marital status: Single    Spouse name: Not on file   Number of children: 0   Years of education: 16   Highest education level: Bachelor's degree (e.g., BA, AB, BS)  Occupational  History    Employer: [employer]  Tobacco Use   Smoking status: Never   Smokeless tobacco: Never  Substance and Sexual Activity   Alcohol use: No    Alcohol/week: 0.0 standard drinks   Drug use: No   Sexual activity: Never    Birth control/protection: None  Other Topics Concern   Not on file  Social History Narrative   Caffeine  2 sodas daily, 1 cup coffee daily.   Social Determinants of Health   Financial Resource Strain: Not on file  Food Insecurity: Not on file  Transportation Needs: Not on file  Physical Activity: Not on file  Stress: Not on file  Social Connections: Not on file  Intimate Partner Violence: Not on file     PHYSICAL EXAM:  VS: Ht 5' 9.5" (1.765 m)   Wt 277 lb (125.6 kg)   BMI 40.32 kg/m  Physical Exam Gen: NAD, alert, cooperative with exam, well-appearing     ASSESSMENT & PLAN:    Sprain of metacarpophalangeal joint of left thumb Had an injury yesterday.  Independent review of the x-ray today shows no bony changes. -Counseled on home exercise therapy and supportive care. -Thumb brace. -Could consider physical therapy.

## 2020-10-07 NOTE — Patient Instructions (Signed)
Good to see you Please try the brace  Please use ice   Please send me a message in MyChart with any questions or updates.  Please see me back as scheduled.   --Dr. Raeford Razor

## 2020-10-08 ENCOUNTER — Ambulatory Visit: Payer: PPO | Admitting: Cardiology

## 2020-10-19 ENCOUNTER — Other Ambulatory Visit: Payer: Self-pay

## 2020-10-19 ENCOUNTER — Encounter: Payer: Self-pay | Admitting: Sports Medicine

## 2020-10-19 ENCOUNTER — Encounter: Payer: Self-pay | Admitting: Cardiology

## 2020-10-19 ENCOUNTER — Telehealth: Payer: Self-pay

## 2020-10-19 ENCOUNTER — Ambulatory Visit: Payer: PPO | Admitting: Cardiology

## 2020-10-19 ENCOUNTER — Ambulatory Visit
Admission: RE | Admit: 2020-10-19 | Discharge: 2020-10-19 | Disposition: A | Discharge status: Home / Self Care | Payer: PPO | Source: Ambulatory Visit | Attending: Sports Medicine | Admitting: Sports Medicine

## 2020-10-19 ENCOUNTER — Ambulatory Visit (INDEPENDENT_AMBULATORY_CARE_PROVIDER_SITE_OTHER): Payer: PPO | Admitting: Sports Medicine

## 2020-10-19 VITALS — BP 122/83 | HR 68 | Temp 97.3°F | Resp 12 | Ht 69.5 in | Wt 274.8 lb

## 2020-10-19 VITALS — Ht 69.5 in | Wt 274.0 lb

## 2020-10-19 DIAGNOSIS — R42 Dizziness and giddiness: Secondary | ICD-10-CM | POA: Diagnosis not present

## 2020-10-19 DIAGNOSIS — M25532 Pain in left wrist: Secondary | ICD-10-CM

## 2020-10-19 DIAGNOSIS — M25512 Pain in left shoulder: Secondary | ICD-10-CM | POA: Diagnosis not present

## 2020-10-19 DIAGNOSIS — M25522 Pain in left elbow: Secondary | ICD-10-CM

## 2020-10-19 DIAGNOSIS — R55 Syncope and collapse: Secondary | ICD-10-CM | POA: Diagnosis not present

## 2020-10-19 DIAGNOSIS — G8929 Other chronic pain: Secondary | ICD-10-CM | POA: Diagnosis not present

## 2020-10-19 DIAGNOSIS — G43001 Migraine without aura, not intractable, with status migrainosus: Secondary | ICD-10-CM | POA: Diagnosis not present

## 2020-10-19 DIAGNOSIS — F319 Bipolar disorder, unspecified: Secondary | ICD-10-CM | POA: Diagnosis not present

## 2020-10-19 DIAGNOSIS — S4992XA Unspecified injury of left shoulder and upper arm, initial encounter: Secondary | ICD-10-CM | POA: Diagnosis not present

## 2020-10-19 DIAGNOSIS — F3130 Bipolar disorder, current episode depressed, mild or moderate severity, unspecified: Secondary | ICD-10-CM | POA: Diagnosis not present

## 2020-10-19 DIAGNOSIS — I10 Essential (primary) hypertension: Secondary | ICD-10-CM | POA: Diagnosis not present

## 2020-10-19 DIAGNOSIS — Z043 Encounter for examination and observation following other accident: Secondary | ICD-10-CM | POA: Diagnosis not present

## 2020-10-19 DIAGNOSIS — Z6841 Body Mass Index (BMI) 40.0 and over, adult: Secondary | ICD-10-CM | POA: Diagnosis not present

## 2020-10-19 NOTE — Progress Notes (Signed)
   PCP: Aretta Nip, MD  Subjective:   HPI: Chief complaint left arm pain  [name] presents today complaining of diffuse left arm pain.  She had a near syncopal event while at the cardiologist today.  She landed on an outstretched hand and now has pain at the wrist, elbow, and shoulder.  She has not noticed any obvious deformity or decreased range of motion other than limitations secondary to pain.  She is right-hand dominant.       Objective:  Well-developed, well-nourished.  No acute distress  Left shoulder: Limited range of motion secondary to pain.  No gross deformity.  No soft tissue swelling.  She is diffusely tender to palpation throughout the proximal humerus but not along the humeral shaft.  Left elbow: Full range of motion.  No obvious effusion.  No soft tissue swelling.  Diffuse tenderness to palpation.  Left wrist: Decreased range of motion secondary to pain.  Diffuse tenderness to palpation across the dorsum of the wrist but nothing focal.  Mild soft tissue swelling.  Good pulses.  X-rays of the left shoulder, left elbow, and left wrist show no obvious fracture.   Assessment & Plan:   Left arm pain secondary to contusion  Reassurance regarding x-rays.  She may utilize ice as needed for pain.  Increase activity as tolerated.  She has a follow-up appointment with Dr. Raeford Razor early next week.  Follow-up with me as needed.  This note was dictated using Dragon naturally speaking software and may contain errors in syntax, spelling, or content which have not been identified prior to signing this note.

## 2020-10-19 NOTE — Progress Notes (Signed)
Date:  10/19/2020   ID:  [name], DOB [birth year], MRN [id number]  PCP:  Aretta Nip, MD  Cardiologist:  Rex Kras, DO, [facility] (established care 10/19/20)  REASON FOR CONSULT: Near syncope, dizziness, abnormal EKG  REQUESTING PHYSICIAN:  Rankins, Bill Salinas, MD [place],  [place] [id number]  Chief Complaint  Patient presents with  . Dizziness  . Near Syncope  . Abnormal ECG  . New Patient (Initial Visit)    HPI  [name] is a 50 y.o. female who presents to the office with a chief complaint of " lightheaded, dizzy,." Patient's past medical history and cardiovascular risk factors include: Hypertension, bipolar disorder type I, migraines, chronic pain, obesity due to excess calories, sleep apnea (borderline per patient).  She is referred to the office at the request of Rankins, Bill Salinas, MD for evaluation of Near syncope, dizziness, abnormal EKG.  Since early part of January 2022 patient has been experiencing episodes of near syncope, lightheadedness, and dizziness often.  The symptoms usually are present at least once a week on average.  No precipitating, improving or worsening factors, she just feels disoriented at times.  She denies any focal neurological deficits or vision changes, tunnel vision.  At times she does experience feeling clammy and diaphoretic.  She denies any syncopal events.  No excessive coffee or tea consumption.  She is on stimulant medications.  And no consumption of energy drinks, thyroid dysfunction, or history of anemia.  No use of recreational drugs or alcohol.  No family history of premature coronary disease, cardiomyopathy, or sudden cardiac death.  FUNCTIONAL STATUS: No structured exercise program or daily routine.   ALLERGIES: Allergies  Allergen Reactions  . Adhesive [Tape] Itching and Rash    Also reacted to Steri Strips and Band-Aids.  . Dilaudid [Hydromorphone Hcl] Itching  . Morphine Nausea And  Vomiting  . Penicillins Hives    Has patient had a PCN reaction causing immediate rash, facial/tongue/throat swelling, SOB or lightheadedness with hypotension: YES Has patient had a PCN reaction causing severe rash involving mucus membranes or skin necrosis: NO Has patient had a PCN reaction that required hospitalization NO Has patient had a PCN reaction occurring within the last 10 years:NO If all of the above answers are "NO", then may proceed with Cephalosporin use.  Marland Kitchen Percocet [Oxycodone-Acetaminophen] Itching  . Prednisone Hives  . Provera [Medroxyprogesterone Acetate] Other (See Comments)    Causes manic episodes  . Ultram [Tramadol Hcl] Itching    MEDICATION LIST PRIOR TO VISIT: Current Meds  Medication Sig  . ALFALFA PO Take 500 mg by mouth daily.  Marland Kitchen amphetamine-dextroamphetamine (ADDERALL XR) 20 MG 24 hr capsule 1 capsule in the morning  . Ascorbic Acid (VITAMIN C) 1000 MG tablet Take 1,000 mg by mouth daily.  . calcium carbonate (OSCAL) 1500 (600 Ca) MG TABS tablet in the morning and at bedtime.  . Cholecalciferol (VITAMIN D3 PO) Take 50 mcg by mouth daily.  . Cyanocobalamin (B-12 PO) Take 5,000 mcg by mouth daily.  . divalproex (DEPAKOTE ER) 500 MG 24 hr tablet TAKE 2 TABLETS BY MOUTH AT BEDTIME FOR MOOD  . Galcanezumab-gnlm (EMGALITY) 120 MG/ML SOSY Inject 120 mg into the skin every 30 (thirty) days.  . hydrOXYzine (VISTARIL) 50 MG capsule Take 50 mg by mouth in the morning and at bedtime.   . lamoTRIgine (LAMICTAL) 150 MG tablet Take 300 mg by mouth at bedtime.  Marland Kitchen LATUDA 20 MG TABS tablet Take 20  mg by mouth every morning.  . loratadine (CLARITIN) 10 MG tablet Take 10 mg by mouth daily.  Marland Kitchen lurasidone (LATUDA) 80 MG TABS tablet Take 1 tablet (80 mg total) by mouth daily with supper. For mood control  . metoprolol succinate (TOPROL-XL) 100 MG 24 hr tablet Take 1 tablet (100 mg total) by mouth daily. Take with or immediately following a meal.  . Multiple Vitamin (MULTIVITAMIN  WITH MINERALS) TABS tablet Take 1 tablet by mouth daily.  . mupirocin ointment (BACTROBAN) 2 % Apply topically.  . Omega-3 Fatty Acids (OMEGA 3 PO) Take by mouth.  . promethazine (PHENERGAN) 25 MG tablet Take 1 tablet (25 mg total) by mouth every 8 (eight) hours as needed for nausea or vomiting. TK 1 T PO BID FOR 15 DAYS PRN  . SUMAtriptan 6 MG/0.5ML SOAJ Inject 0.48m onset of migraine and may repeat in 2 hours as needed (max 159m/ 24 hours).  . traZODone (DESYREL) 100 MG tablet Take 100 mg by mouth at bedtime.  . Zinc 50 MG TABS 1 tablet   Current Facility-Administered Medications for the 10/19/20 encounter (Office Visit) with ToRex KrasDO  Medication  . methylPREDNISolone acetate (DEPO-MEDROL) injection 40 mg  . valproate (DEPACON) 1,000 mg in dextrose 5 % 50 mL IVPB     PAST MEDICAL HISTORY: Past Medical History:  Diagnosis Date  . Achilles tendinitis   . ADHD (attention deficit hyperactivity disorder), inattentive type    Diagnosed as an adult after starting college  . Allergy   . Arthritis   . Bipolar I disorder 04/18/2014   most recent episode mixed  . Borderline personality disorder 10/27/2011  . Cataracts, bilateral   . Chronic pain disorder    due to several injuries affecting numerous areas of her body throughout the years  . 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 Eczema   . Episodic cluster headache, not intractable 12/02/2014  . Family history of genetic disease carrier   . Ganglion cyst 09/29/2009   left wrist (2 cyst)  . Generalized anxiety disorder 10/27/2011  . History of multiple concussions    October 2018 and September 2019  . Hyperprolactinemia   . Hypertension   . Insomnia 02/17/2015  . Lipoma   . 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 L hamstring    . Migraine with status migrainosus 01/08/2013  . Monoallelic mutation of CHEK2 gene in female patient  02/27/2018   CHEK2 [id number])  . Pes planus   . Photophobia of both eyes 05/04/2014    PAST SURGICAL HISTORY: Past Surgical History:  Procedure Laterality Date  . ANKLE SURGERY  12/88   left   . chest nodule  1990?   rt chest wall nodule removal  . GANGLION CYST EXCISION  2011  . lipoma removal    . right bunioectomy    . SHOULDER SURGERY  01/13/2011   right, partial tear  . tumor resection left thigh      FAMILY HISTORY: The patient family history includes Bipolar disorder in her father; Bone cancer (age of onset: 4547in her cousin; Breast cancer in her maternal aunt; Breast cancer (age of onset: 355in her maternal aunt; Breast cancer (age of onset: 6554in her maternal aunt and maternal aunt; Breast cancer (age of onset: 7520in her mother; Cervical cancer in her cousin; Colon cancer (age of onset: 6630in her maternal grandmother; Colon cancer (  age of onset: 65) in her maternal uncle; Diabetes in her paternal grandfather; Endometrial cancer in her mother; Heart attack in her father; Heart disease in her father, maternal aunt, and maternal grandmother; Hyperlipidemia in her mother; Hypertension in her father and mother; Lymphoma (age of onset: 70) in her maternal grandfather.  SOCIAL HISTORY:  The patient  reports that she has never smoked. She has never used smokeless tobacco. She reports that she does not drink alcohol and does not use drugs.  REVIEW OF SYSTEMS: Review of Systems  Constitutional: Negative for chills and fever.  HENT:  Negative for hoarse voice and nosebleeds.   Eyes:  Negative for discharge, double vision and pain.  Cardiovascular:  Positive for near-syncope. Negative for chest pain, claudication, dyspnea on exertion, leg swelling, orthopnea, palpitations, paroxysmal nocturnal dyspnea and syncope.  Respiratory:  Negative for hemoptysis and shortness of breath.   Musculoskeletal:  Negative for muscle cramps and myalgias.  Gastrointestinal:  Negative  for abdominal pain, constipation, diarrhea, hematemesis, hematochezia, melena, nausea and vomiting.  Neurological:  Positive for dizziness and light-headedness.   PHYSICAL EXAM: Vitals with BMI 10/19/2020 10/19/2020 10/07/2020  Height 5' 9.5" 5' 9.5" 5' 9.5"  Weight 274 lbs 274 lbs 13 oz 277 lbs  BMI 39.9 00.34 91.79  Systolic - 150 -  Diastolic - 83 -  Pulse - 68 -  Some encounter information is confidential and restricted. Go to Review Flowsheets activity to see all data.   Orthostatic VS for the past 72 hrs (Last 3 readings):  Orthostatic BP Patient Position BP Location Cuff Size Orthostatic Pulse  10/19/20 1343 155/90 Sitting Left Arm Normal 65  10/19/20 1312 (!) 127/91 Standing Left Arm Large 72  10/19/20 1311 134/84 Sitting Left Arm Large 68   CONSTITUTIONAL: Well-developed and well-nourished. No acute distress.  SKIN: Skin is warm and dry. No rash noted. No cyanosis. No pallor. No jaundice HEAD: Normocephalic and atraumatic.  EYES: No scleral icterus MOUTH/THROAT: Moist oral membranes.  NECK: No JVD present. No thyromegaly noted. No carotid bruits  LYMPHATIC: No visible cervical adenopathy.  CHEST Normal respiratory effort. No intercostal retractions  LUNGS: Clear to auscultation bilaterally.  No stridor. No wheezes. No rales.  CARDIOVASCULAR: Regular, positive S1-S2, no murmurs rubs or gallops appreciated ABDOMINAL: Obese, soft, nontender, nondistended, positive bowel sounds in all 4 quadrants, no apparent ascites.  EXTREMITIES: No peripheral edema, warm to touch bilaterally, 2+ DP and PT pulses. HEMATOLOGIC: No significant bruising NEUROLOGIC: Oriented to person, place, and time. Nonfocal. Normal muscle tone.  PSYCHIATRIC: Normal mood and affect. Normal behavior. Cooperative  CARDIAC DATABASE: EKG: 10/19/2020: Normal sinus rhythm, 65 bpm, without underlying injury pattern.  Echocardiogram: No results found for this or any previous visit from the past 1095 days.   Stress  Testing: No results found for this or any previous visit from the past 1095 days.  Heart Catheterization: None  LABORATORY DATA: CBC Latest Ref Rng & Units 07/19/2020 01/15/2020 05/08/2019  WBC 3.4 - 10.8 x10E3/uL 7.2 7.0 -  Hemoglobin 11.1 - 15.9 g/dL 14.2 14.8 13.3  Hematocrit 34.0 - 46.6 % 43.1 44.0 39.0  Platelets 150 - 450 x10E3/uL 246 218 -    CMP Latest Ref Rng & Units 07/19/2020 01/15/2020 05/08/2019  Glucose 65 - 99 mg/dL 78 81 92  BUN 6 - 24 mg/dL 14 10 10   Creatinine 0.57 - 1.00 mg/dL 0.93 1.08(H) 1.00  Sodium 134 - 144 mmol/L 140 142 139  Potassium 3.5 - 5.2 mmol/L 4.5 4.6 4.1  Chloride 96 - 106 mmol/L 101 106 105  CO2 20 - 29 mmol/L 25 23 -  Calcium 8.7 - 10.2 mg/dL 9.7 9.6 -  Total Protein 6.0 - 8.5 g/dL 6.1 5.8(L) -  Total Bilirubin 0.0 - 1.2 mg/dL 0.3 0.4 -  Alkaline Phos 44 - 121 IU/L 93 70 -  AST 0 - 40 IU/L 18 14 -  ALT 0 - 32 IU/L 13 7 -    Lipid Panel     Component Value Date/Time   CHOL 198 12/29/2016 0627   TRIG 136 12/29/2016 0627   HDL 53 12/29/2016 0627   CHOLHDL 3.7 12/29/2016 0627   VLDL 27 12/29/2016 0627   LDLCALC 118 (H) 12/29/2016 0627    No components found for: NTPROBNP No results for input(s): PROBNP in the last 8760 hours. No results for input(s): TSH in the last 8760 hours.  BMP Recent Labs    01/15/20 0948 07/19/20 1004  NA 142 140  K 4.6 4.5  CL 106 101  CO2 23 25  GLUCOSE 81 78  BUN 10 14  CREATININE 1.08* 0.93  CALCIUM 9.6 9.7  GFRNONAA 60  --   GFRAA 69  --     HEMOGLOBIN A1C Lab Results  Component Value Date   HGBA1C 5.1 12/29/2016   MPG 99.67 12/29/2016    IMPRESSION:    ICD-10-CM   1. Near syncope  R55 EKG 12-Lead    Hemoglobin and hematocrit, blood    Basic metabolic panel    Magnesium    PCV ECHOCARDIOGRAM COMPLETE    LONG TERM MONITOR (3-14 DAYS)    TSH    2. Lightheadedness  R42     3. Bipolar 1 disorder (HCC)  F31.9     4. Class 3 severe obesity due to excess calories without serious  comorbidity with body mass index (BMI) of 40.0 to 44.9 in adult [facility])  E66.01    Z68.41        RECOMMENDATIONS: Dabney Dever Noll is a 50 y.o. female whose past medical history and cardiac risk factors include: Hypertension, bipolar disorder type I, migraines, chronic pain, obesity due to excess calories, sleep apnea (borderline per patient).  Near syncope Based on her description of symptoms appears to be vasovagal in nature. Educated on keeping herself well-hydrated, eating 3 balanced meals which are heart healthy, changing positions slowly, compression stockings, and considering increasing salt on days when she has episodes of near syncope. Orthostatic vital signs negative. ECG nonischemic. Echocardiogram will be ordered to evaluate for structural heart disease and left ventricular systolic function. 7-day extended Holter monitor to evaluate for dysrhythmias. Check H&H, BMP, TSH Patient is on multiple medications for ADHD, bipolar, migraines which have directly drug interactions.  I have asked her to discuss/review her medications with both her PCP and psychiatry to see if these medications can be down titrated or consolidated to prevent polypharmacy.  Lightheadedness See above  Bipolar 1 disorder ([place]) Currently being managed by psychiatry.  Medications reviewed.  Class 3 severe obesity due to excess calories without serious comorbidity with body mass index (BMI) of 40.0 to 44.9 in adult [facility]) Body mass index is 40 kg/m. I reviewed with the patient the importance of diet, regular physical activity/exercise, weight loss.   Patient is educated on increasing physical activity gradually as tolerated.  With the goal of moderate intensity exercise for 30 minutes a day 5 days a week.   FINAL MEDICATION LIST END OF ENCOUNTER: No orders of the defined  types were placed in this encounter.   Medications Discontinued During This Encounter  Medication Reason  . augmented betamethasone  dipropionate (DIPROLENE-AF) 0.05 % cream Error  . CALCIUM PO Error  . doxycycline (VIBRAMYCIN) 100 MG capsule Error  . FLUARIX QUADRIVALENT 0.5 ML injection Error  . Multiple Vitamins-Minerals (ZINC PO) Error  . SUMAtriptan Succinate Refill 6 MG/0.5ML SOCT Error  . vitamin B-12 (CYANOCOBALAMIN) 100 MCG tablet Error     Current Outpatient Medications:  .  ALFALFA PO, Take 500 mg by mouth daily., Disp: , Rfl:  .  amphetamine-dextroamphetamine (ADDERALL XR) 20 MG 24 hr capsule, 1 capsule in the morning, Disp: , Rfl:  .  Ascorbic Acid (VITAMIN C) 1000 MG tablet, Take 1,000 mg by mouth daily., Disp: , Rfl:  .  calcium carbonate (OSCAL) 1500 (600 Ca) MG TABS tablet, in the morning and at bedtime., Disp: , Rfl:  .  Cholecalciferol (VITAMIN D3 PO), Take 50 mcg by mouth daily., Disp: , Rfl:  .  Cyanocobalamin (B-12 PO), Take 5,000 mcg by mouth daily., Disp: , Rfl:  .  divalproex (DEPAKOTE ER) 500 MG 24 hr tablet, TAKE 2 TABLETS BY MOUTH AT BEDTIME FOR MOOD, Disp: 60 tablet, Rfl: 5 .  Galcanezumab-gnlm (EMGALITY) 120 MG/ML SOSY, Inject 120 mg into the skin every 30 (thirty) days., Disp: 1.12 mL, Rfl: 11 .  hydrOXYzine (VISTARIL) 50 MG capsule, Take 50 mg by mouth in the morning and at bedtime. , Disp: , Rfl: 2 .  lamoTRIgine (LAMICTAL) 150 MG tablet, Take 300 mg by mouth at bedtime., Disp: , Rfl: 1 .  LATUDA 20 MG TABS tablet, Take 20 mg by mouth every morning., Disp: , Rfl:  .  loratadine (CLARITIN) 10 MG tablet, Take 10 mg by mouth daily., Disp: , Rfl:  .  lurasidone (LATUDA) 80 MG TABS tablet, Take 1 tablet (80 mg total) by mouth daily with supper. For mood control, Disp: 30 tablet, Rfl: 0 .  metoprolol succinate (TOPROL-XL) 100 MG 24 hr tablet, Take 1 tablet (100 mg total) by mouth daily. Take with or immediately following a meal., Disp: 30 tablet, Rfl: 0 .  Multiple Vitamin (MULTIVITAMIN WITH MINERALS) TABS tablet, Take 1 tablet by mouth daily., Disp: , Rfl:  .  mupirocin ointment (BACTROBAN) 2  %, Apply topically., Disp: , Rfl:  .  Omega-3 Fatty Acids (OMEGA 3 PO), Take by mouth., Disp: , Rfl:  .  promethazine (PHENERGAN) 25 MG tablet, Take 1 tablet (25 mg total) by mouth every 8 (eight) hours as needed for nausea or vomiting. TK 1 T PO BID FOR 15 DAYS PRN, Disp: 30 tablet, Rfl: 0 .  SUMAtriptan 6 MG/0.5ML SOAJ, Inject 0.73m onset of migraine and may repeat in 2 hours as needed (max 162m/ 24 hours)., Disp: 16 mL, Rfl: 0 .  traZODone (DESYREL) 100 MG tablet, Take 100 mg by mouth at bedtime., Disp: , Rfl:  .  Zinc 50 MG TABS, 1 tablet, Disp: , Rfl:   Current Facility-Administered Medications:  .  methylPREDNISolone acetate (DEPO-MEDROL) injection 40 mg, 40 mg, Intra-articular, Once, Hudnall, Shane R, MD .  valproate (DEPACON) 1,000 mg in dextrose 5 % 50 mL IVPB, 1,000 mg, Intravenous, Once, Dohmeier, CaAsencion PartridgeMD  Orders Placed This Encounter  Procedures  . Hemoglobin and hematocrit, blood  . Basic metabolic panel  . Magnesium  . TSH  . LONG TERM MONITOR (3-14 DAYS)  . EKG 12-Lead  . PCV ECHOCARDIOGRAM COMPLETE    There are no Patient Instructions  on file for this visit.   --Continue cardiac medications as reconciled in final medication list. --Return in about 6 weeks (around 11/30/2020) for Follow up near syncope and review test results. Or sooner if needed. --Continue follow-up with your primary care physician regarding the management of your other chronic comorbid conditions.  Patient's questions and concerns were addressed to her satisfaction. She voices understanding of the instructions provided during this encounter.   This note was created using a voice recognition software as a result there may be grammatical errors inadvertently enclosed that do not reflect the nature of this encounter. Every attempt is made to correct such errors.  Rex Kras, [place], [facility]  Pager: [phone] Office: [phone]

## 2020-10-20 ENCOUNTER — Other Ambulatory Visit: Payer: PPO

## 2020-10-25 ENCOUNTER — Ambulatory Visit: Payer: PPO | Admitting: Family Medicine

## 2020-10-25 NOTE — Progress Notes (Deleted)
[name] - 50 y.o. female MRN [id number]  Date of birth: 1970/10/23  SUBJECTIVE:  Including CC & ROS.  No chief complaint on file.   [name] is a 50 y.o. female that is  ***.  ***   Review of Systems See HPI   HISTORY: Past Medical, Surgical, Social, and Family History Reviewed & Updated per EMR.   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   rt chest wall nodule removal   GANGLION CYST EXCISION  2011   lipoma removal     right bunioectomy     SHOULDER SURGERY  01/13/2011   right, partial tear   tumor resection left thigh      Family History  Problem Relation Age of Onset   Hypertension  Mother    Hyperlipidemia Mother    Breast cancer Mother 15       genetic testing- CHEK2 likely pathogenic variant   Endometrial cancer Mother    Heart attack Father    Heart disease Father    Hypertension Father    Bipolar disorder Father    Heart disease Maternal Aunt    Breast cancer Maternal Aunt 65   Breast cancer Maternal Aunt 65   Breast cancer Maternal Aunt 35   Breast cancer Maternal Aunt    Colon cancer Maternal Uncle 75   Heart disease Maternal Grandmother    Colon cancer Maternal Grandmother 66   Lymphoma Maternal Grandfather 84   Diabetes Paternal Grandfather    Bone cancer Cousin 45   Cervical cancer Cousin        Genetic testing- 'was positive for a gene' had a mastectomy    Social History   Socioeconomic History   Marital status: Single    Spouse name: Not on file   Number of children: 0   Years of education: 16   Highest education level: Bachelor's degree (e.g., BA, AB, BS)  Occupational History    Employer: [employer]  Tobacco Use   Smoking status: Never   Smokeless tobacco: Never  Vaping Use   Vaping Use:  Never used  Substance and Sexual Activity   Alcohol use: No    Alcohol/week: 0.0 standard drinks   Drug use: No   Sexual activity: Never    Birth control/protection: None  Other Topics Concern   Not on file  Social History Narrative   Caffeine  2 sodas daily, 1 cup coffee daily.   Social Determinants of Health   Financial Resource Strain: Not on file  Food Insecurity: Not on file  Transportation Needs: Not on file  Physical Activity: Not on file  Stress: Not on file  Social Connections: Not on file  Intimate Partner Violence: Not on file     PHYSICAL EXAM:  VS: There were no vitals taken for this visit. Physical Exam Gen: NAD, alert, cooperative with exam, well-appearing MSK:  ***      ASSESSMENT & PLAN:   No problem-specific Assessment & Plan notes found for this encounter.

## 2020-11-01 ENCOUNTER — Ambulatory Visit: Payer: Self-pay

## 2020-11-01 ENCOUNTER — Ambulatory Visit: Payer: PPO | Admitting: Family Medicine

## 2020-11-01 VITALS — Ht 69.5 in | Wt 274.0 lb

## 2020-11-01 DIAGNOSIS — M7542 Impingement syndrome of left shoulder: Secondary | ICD-10-CM

## 2020-11-01 DIAGNOSIS — M25512 Pain in left shoulder: Secondary | ICD-10-CM

## 2020-11-01 DIAGNOSIS — M24159 Other articular cartilage disorders, unspecified hip: Secondary | ICD-10-CM

## 2020-11-01 MED ORDER — METHYLPREDNISOLONE ACETATE 40 MG/ML IJ SUSP
40.0000 mg | Freq: Once | INTRAMUSCULAR | Status: AC
  Administered 2020-11-01: 40 mg via INTRA_ARTICULAR

## 2020-11-01 NOTE — Patient Instructions (Signed)
Good to see you Please try heat before the exercises and ice after  Please try the exercises   Please send me a message in MyChart with any questions or updates.  Please see me back in 4 weeks.   --Dr. Raeford Razor

## 2020-11-01 NOTE — Progress Notes (Signed)
[name] - 50 y.o. female MRN [id number]  Date of birth: 08-31-1970  SUBJECTIVE:  Including CC & ROS.  No chief complaint on file.   [name] is a 50 y.o. female that is presenting with acute left shoulder pain after she fell at a doctor's office.  She had a near syncopal episode and fell onto her left arm and shoulder.  Since that time she has had a popping sensation occurring in the shoulder.  Imaging has been negative to this point..  Independent review of the left shoulder x-ray from 9/20 shows no acute changes.   Review of Systems See HPI   HISTORY: Past Medical, Surgical, Social, and Family History Reviewed & Updated per EMR.   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   rt chest wall nodule removal   GANGLION CYST EXCISION  2011   lipoma removal     right bunioectomy     SHOULDER SURGERY  01/13/2011   right, partial tear   tumor resection left thigh      Family History  Problem Relation Age of Onset   Hypertension Mother    Hyperlipidemia Mother    Breast cancer Mother 50       genetic testing- CHEK2 likely pathogenic variant   Endometrial cancer Mother    Heart attack Father    Heart disease Father    Hypertension Father    Bipolar disorder Father    Heart disease Maternal Aunt    Breast cancer Maternal Aunt 65   Breast cancer Maternal Aunt 65   Breast cancer Maternal Aunt 35   Breast cancer Maternal Aunt    Colon cancer Maternal Uncle 75   Heart disease Maternal Grandmother    Colon cancer Maternal Grandmother 66   Lymphoma Maternal Grandfather 84   Diabetes Paternal Grandfather    Bone cancer Cousin 45   Cervical cancer Cousin        Genetic testing- 'was positive for a gene' had a mastectomy    Social History   Socioeconomic History   Marital status:  Single    Spouse name: Not on file   Number of children: 0   Years of education: 16   Highest education level: Bachelor's degree (e.g., BA, AB, BS)  Occupational History    Employer: [employer]  Tobacco Use   Smoking status: Never   Smokeless tobacco: Never  Vaping Use   Vaping Use: Never used  Substance and Sexual Activity   Alcohol use: No    Alcohol/week: 0.0 standard drinks   Drug use: No   Sexual activity: Never    Birth control/protection: None  Other Topics Concern   Not on file  Social History Narrative   Caffeine  2 sodas daily, 1 cup coffee daily.   Social Determinants of Health   Financial Resource Strain: Not on file  Food Insecurity: Not on file  Transportation Needs: Not on file  Physical Activity: Not on file  Stress: Not on file  Social Connections: Not on file   Intimate Partner Violence: Not on file     PHYSICAL EXAM:  VS: Ht 5' 9.5" (1.765 m)   Wt 274 lb (124.3 kg)   BMI 39.88 kg/m  Physical Exam Gen: NAD, alert, cooperative with exam, well-appearing   Limited ultrasound: Left shoulder:  Normal-appearing biceps tendon. Normal-appearing subscapularis. There is thickening of the deltoid off the acromion with a snapping sensation on dynamic testing.  Summary: Symptoms consistent with impingement.  Ultrasound and interpretation by Clearance Coots, MD   Aspiration/Injection Procedure Note [name] 28-Jan-1971  Procedure: Injection Indications: Left shoulder pain  Procedure Details Consent: Risks of procedure as well as the alternatives and risks of each were explained to the (patient/caregiver).  Consent for procedure obtained. Time Out: Verified patient identification, verified procedure, site/side was marked, verified correct patient position, special equipment/implants available, medications/allergies/relevent history reviewed, required imaging and test results available.  Performed.  The area was cleaned with iodine and alcohol swabs.    The left subacromial space was injected using 1 cc's of 40 mg Depo-Medrol and 4 cc's of 0.25% bupivacaine with a 25 1 1/2" needle.  Ultrasound was used. Images were obtained in long views showing the injection.     A sterile dressing was applied.  Patient did tolerate procedure well.     ASSESSMENT & PLAN:   Shoulder impingement syndrome, left There does appear to be thickening off of the acromion which could be the deltoid ligament versus just thickening of the subacromial bursa -Counseled on home exercise therapy and supportive care. -Injection today. -Could consider physical therapy.  Labral tear of hip, degenerative Having ongoing pain in the groin.  Has tried an injection but pain has returned.  Has been doing home exercises -Counseled on home exercise therapy and supportive  care. -Could consider physical therapy or further imaging.

## 2020-11-01 NOTE — Assessment & Plan Note (Signed)
There does appear to be thickening off of the acromion which could be the deltoid ligament versus just thickening of the subacromial bursa -Counseled on home exercise therapy and supportive care. -Injection today. -Could consider physical therapy.

## 2020-11-01 NOTE — Assessment & Plan Note (Signed)
Having ongoing pain in the groin.  Has tried an injection but pain has returned.  Has been doing home exercises -Counseled on home exercise therapy and supportive care. -Could consider physical therapy or further imaging.

## 2020-11-08 ENCOUNTER — Encounter: Payer: Self-pay | Admitting: Family Medicine

## 2020-11-09 ENCOUNTER — Ambulatory Visit (INDEPENDENT_AMBULATORY_CARE_PROVIDER_SITE_OTHER): Payer: PPO | Admitting: Family Medicine

## 2020-11-09 VITALS — Ht 69.5 in | Wt 274.0 lb

## 2020-11-09 DIAGNOSIS — M24159 Other articular cartilage disorders, unspecified hip: Secondary | ICD-10-CM | POA: Diagnosis not present

## 2020-11-09 NOTE — Assessment & Plan Note (Signed)
Acute on chronic in nature.  Having significant pain with mechanical symptoms and pain with weightbearing.  Concern for labral tear or FAI. -Counseled on home exercise therapy and supportive care. -MRI of the right hip to evaluate for labral tear versus FAI

## 2020-11-09 NOTE — Patient Instructions (Signed)
Good to see you Please call [phone] to schedule the MRI   Please send me a message in MyChart with any questions or updates.  We'll setup a virtual visit once the MRI is resutled.   --Dr. Raeford Razor

## 2020-11-09 NOTE — Progress Notes (Signed)
[name] - 50 y.o. female MRN [id number]  Date of birth: 16-May-1970  SUBJECTIVE:  Including CC & ROS.  No chief complaint on file.   [name] is a 50 y.o. female that is presenting with acute on chronic right hip pain.  The pain is occurring in the right groin.  We have tried 6 weeks of home exercise therapy as a physician directed.  She has tried medications as well as intra-articular injection.  Pain is still severe and worse with weightbearing.   Review of Systems See HPI   HISTORY: Past Medical, Surgical, Social, and Family History Reviewed & Updated per EMR.   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   rt chest wall nodule removal   GANGLION CYST EXCISION  2011   lipoma removal     right bunioectomy     SHOULDER SURGERY  01/13/2011   right, partial tear   tumor resection left thigh      Family History  Problem Relation Age of Onset   Hypertension Mother    Hyperlipidemia Mother    Breast cancer Mother 28       genetic testing- CHEK2 likely pathogenic variant   Endometrial cancer Mother    Heart attack Father    Heart disease Father    Hypertension Father    Bipolar disorder Father    Heart disease Maternal Aunt    Breast cancer Maternal Aunt 65   Breast cancer Maternal Aunt 65   Breast cancer Maternal Aunt 35   Breast cancer Maternal Aunt    Colon cancer Maternal Uncle 75   Heart disease Maternal Grandmother    Colon cancer Maternal Grandmother 66   Lymphoma Maternal Grandfather 84   Diabetes Paternal Grandfather    Bone cancer Cousin 45   Cervical cancer Cousin        Genetic testing- 'was positive for a gene' had a mastectomy    Social History   Socioeconomic History   Marital status: Single    Spouse name: Not on file   Number of children:  0   Years of education: 16   Highest education level: Bachelor's degree (e.g., BA, AB, BS)  Occupational History    Employer: [employer]  Tobacco Use   Smoking status: Never   Smokeless tobacco: Never  Vaping Use   Vaping Use: Never used  Substance and Sexual Activity   Alcohol use: No    Alcohol/week: 0.0 standard drinks   Drug use: No   Sexual activity: Never    Birth control/protection: None  Other Topics Concern   Not on file  Social History Narrative   Caffeine  2 sodas daily, 1 cup coffee daily.   Social Determinants of Health   Financial Resource Strain: Not on file  Food Insecurity: Not on file  Transportation Needs: Not on file  Physical Activity: Not on file  Stress: Not on file  Social Connections: Not on file  Intimate Partner Violence: Not on file     PHYSICAL EXAM:  VS: Ht 5' 9.5"  (1.765 m)   Wt 274 lb (124.3 kg)   BMI 39.88 kg/m  Physical Exam Gen: NAD, alert, cooperative with exam, well-appearing MSK: Right hip:  Limited hip flexion and extension. Weakness with hip flexion. Positive FADIR  Neurovascular intact  ASSESSMENT & PLAN:   Labral tear of hip, degenerative Acute on chronic in nature.  Having significant pain with mechanical symptoms and pain with weightbearing.  Concern for labral tear or FAI. -Counseled on home exercise therapy and supportive care. -MRI of the right hip to evaluate for labral tear versus FAI

## 2020-11-11 DIAGNOSIS — J3089 Other allergic rhinitis: Secondary | ICD-10-CM | POA: Diagnosis not present

## 2020-11-11 DIAGNOSIS — L237 Allergic contact dermatitis due to plants, except food: Secondary | ICD-10-CM | POA: Diagnosis not present

## 2020-11-11 DIAGNOSIS — J301 Allergic rhinitis due to pollen: Secondary | ICD-10-CM | POA: Diagnosis not present

## 2020-11-11 DIAGNOSIS — R21 Rash and other nonspecific skin eruption: Secondary | ICD-10-CM | POA: Diagnosis not present

## 2020-11-21 ENCOUNTER — Ambulatory Visit
Admission: RE | Admit: 2020-11-21 | Discharge: 2020-11-21 | Disposition: A | Discharge status: Home / Self Care | Payer: PPO | Source: Ambulatory Visit | Attending: Family Medicine | Admitting: Family Medicine

## 2020-11-21 DIAGNOSIS — M24159 Other articular cartilage disorders, unspecified hip: Secondary | ICD-10-CM

## 2020-11-21 DIAGNOSIS — M25551 Pain in right hip: Secondary | ICD-10-CM | POA: Diagnosis not present

## 2020-11-23 ENCOUNTER — Encounter: Payer: Self-pay | Admitting: Family Medicine

## 2020-11-23 ENCOUNTER — Telehealth (INDEPENDENT_AMBULATORY_CARE_PROVIDER_SITE_OTHER): Payer: PPO | Admitting: Family Medicine

## 2020-11-23 DIAGNOSIS — M24159 Other articular cartilage disorders, unspecified hip: Secondary | ICD-10-CM | POA: Diagnosis not present

## 2020-11-23 NOTE — Assessment & Plan Note (Signed)
Symptoms still seem most suggestive of the labrum being the issue.  Given the presentation and occurrence of her pain.  MRI was not demonstrating any defects. -Counseled on home exercise therapy and supportive care. -Referral to orthopedic surgery

## 2020-11-23 NOTE — Progress Notes (Signed)
Virtual Visit via Video Note  I connected with [name] on 11/23/20 at  3:50 PM EDT by a video enabled telemedicine application and verified that I am speaking with the correct person using two identifiers.  Location: Patient: home Provider: office   I discussed the limitations of evaluation and management by telemedicine and the availability of in person appointments. The patient expressed understanding and agreed to proceed.  History of Present Illness:  [name] is a 50 year old female that is following up after the MRI of her right hip.  This was demonstrating tendinosis in the origin of the hamstring but no changes in the right hip joint.  Continues to have groin pain she in the inguinal crease and pain with walking.  Observations/Objective:   Assessment and Plan:  Degenerative labral tear right hip: Symptoms still seem most suggestive of the labrum being the issue.  Given the presentation and occurrence of her pain.  MRI was not demonstrating any defects. -Counseled on home exercise therapy and supportive care. -Referral to orthopedic surgery  Follow Up Instructions:    I discussed the assessment and treatment plan with the patient. The patient was provided an opportunity to ask questions and all were answered. The patient agreed with the plan and demonstrated an understanding of the instructions.   The patient was advised to call back or seek an in-person evaluation if the symptoms worsen or if the condition fails to improve as anticipated.    Clearance Coots, MD

## 2020-11-27 DIAGNOSIS — K0510 Chronic gingivitis, plaque induced: Secondary | ICD-10-CM | POA: Diagnosis not present

## 2020-11-27 DIAGNOSIS — J069 Acute upper respiratory infection, unspecified: Secondary | ICD-10-CM | POA: Diagnosis not present

## 2020-11-29 ENCOUNTER — Ambulatory Visit: Payer: PPO | Admitting: Family Medicine

## 2020-12-01 ENCOUNTER — Ambulatory Visit: Payer: PPO | Admitting: Cardiology

## 2020-12-15 ENCOUNTER — Ambulatory Visit: Payer: PPO | Admitting: Cardiology

## 2020-12-22 DIAGNOSIS — M25551 Pain in right hip: Secondary | ICD-10-CM | POA: Diagnosis not present

## 2021-01-05 DIAGNOSIS — Z1211 Encounter for screening for malignant neoplasm of colon: Secondary | ICD-10-CM | POA: Diagnosis not present

## 2021-01-06 DIAGNOSIS — M25551 Pain in right hip: Secondary | ICD-10-CM | POA: Diagnosis not present

## 2021-01-06 DIAGNOSIS — R2689 Other abnormalities of gait and mobility: Secondary | ICD-10-CM | POA: Diagnosis not present

## 2021-01-06 DIAGNOSIS — M6281 Muscle weakness (generalized): Secondary | ICD-10-CM | POA: Diagnosis not present

## 2021-01-18 ENCOUNTER — Ambulatory Visit (INDEPENDENT_AMBULATORY_CARE_PROVIDER_SITE_OTHER): Payer: PPO | Admitting: Adult Health

## 2021-01-18 ENCOUNTER — Encounter: Payer: Self-pay | Admitting: Adult Health

## 2021-01-18 VITALS — BP 123/84 | HR 75 | Ht 69.0 in | Wt 275.6 lb

## 2021-01-18 DIAGNOSIS — G43009 Migraine without aura, not intractable, without status migrainosus: Secondary | ICD-10-CM | POA: Diagnosis not present

## 2021-01-18 DIAGNOSIS — R42 Dizziness and giddiness: Secondary | ICD-10-CM | POA: Diagnosis not present

## 2021-01-18 NOTE — Patient Instructions (Signed)
Your Plan:  Do vestibular exercises at home Continue emgality and Depakote If your symptoms worsen or you develop new symptoms please let us know.       Thank you for coming to see [place] at [facility]. I hope we have been able to provide you high quality care today.  You may receive a patient satisfaction survey over the next few weeks. We would appreciate your feedback and comments so that we may continue to improve ourselves and the health of our patients.

## 2021-01-18 NOTE — Progress Notes (Signed)
PATIENT: [name] DOB: 11/26/1970  REASON FOR VISIT: follow up HISTORY FROM: patient Primary neurologist: Dr. Brett Fairy  HISTORY OF PRESENT ILLNESS: Today 01/18/21:  [name] is a 50 year old female with a history of postconcussive syndrome, migraine headaches and vertigo.  She returns today for follow-up.  She states that she has had 3 falls since last seen.  She states 1 fall occurred while she was at the cardiologist.  States that when she got down off the exam table she got dizzy and fell to the floor.  States that the room was spinning.  She has had 2 additional falls but no injuries.  Patient reports that she is not doing the vestibular exercises as discussed at the last visit.  Cardiology referred her for a cardiac ultrasound with the patient has not scheduled this.  Emgality and Depakote continue to work well for her migraines.  She returns today for an evaluation.  09/06/20 [name] is a 50 year old female with a history of postconcussive syndrome, migraine headaches and dizziness.  She returns today for follow-up.  She states that she has been having more dizziness and falls.  She has not been doing the exercises that she was started vestibular rehab.  She states one of the fall she tripped over bricks.  Other falls occur because she feels dizzy.  She also states that she has been under some stress from her mother, brother and a Mudlogger.  In the past the patient has had issues with suicidal thoughts she states that she has not had any suicidal thoughts but she feels like these events could cause her to have those thoughts.  She returns today for an evaluation.  07/19/20:[name] is a 50 year old female with a history of postconcussive syndrome, migraine headaches, dizziness and memory disturbance.  She returns today for follow-up.  She states that she is only having approximately 1 migraine a week.  She typically can take Tylenol and it resolves fairly quickly.  She does have  sumatriptan if needed but she has not had to use this.  She did go to physical therapy for vestibular rehab.  She states that her episodes of dizziness has improved.  They are spontaneous and do not occur daily.  No episode has lasted longer than 10 minutes.  They typically only last several minutes.  She also reports that at night she is having what she believes is restless legs.  She states that her legs start jumping and she has a trouble falling asleep.  Fortunately it does not occur every night but does occur most nights.  She has never had any formal work-up for restless leg symptoms.  02/05/20: [name] is a 50 year old female with a history of postconcussive syndrome, migraine headaches and memory disturbance.  She returns today for follow-up.  She was just seen on 1216 and was doing well.  She states that in the last 2 weeks she has been having dizziness that she describes as the room spinning, feeling as if she may pass out during these episodes.  She also describes it as feeling as if she is on an elevator and the floor gives out.  She states in the last week the episodes have increased to at least every other day.  The episodes are typically brief and reports that she can typically hold onto the counter and it slowly resolves.  She states that she had the same symptoms when she was diagnosed with a concussion.  She went to vestibular  rehab and they resolved.  She states that she did fall into her refrigerator 3 weeks ago but no significant injuries.  She states that she recently was at [facility] and bent down to pick something up off a shelf and hit her head on a cart.   01/15/20: [name] is a 50 year old female with a history of postconcussive syndrome, migraine headaches and memory disturbance.  She returns today for follow-up.  At the last visit she was referred to neuropsychology for memory evaluation.  They felt that her memory disturbance was due to ADHD.  It was recommended that she be put  back on stimulant medication.  She reports that her psychiatrist did put her back on Adderall.  She has found it somewhat beneficial.  She continues to struggle some at work with inputting information.  Migraines have been controlled on Emgality and Depakote.  She returns today for an evaluation.  07/21/19: [name] is a 50 year old female with a history of postconcussive syndrome and migraine headaches.  She returns today for follow-up.  She states that she has been seeing speech therapy and feels that it has helped some.  She reports her therapist has mentioned additional testing with neuropsychology.  Patient states that she works at [employer].  States that she had a customer recently who was on the phone while checking out.  She states that she became very overwhelmed over this incident.  She states that she also has occasions where she forgets what she goes into a room for.  Often she does remember it later.  She states that she often loses her train of thought in mid sentence.  Or may not be able to find the right word to say.  She also states when she is working if a customer gives her her email address she has a hard time processing it.  She was on Adderall when she was in school.  She states that her psychiatrist stopped this when school ended.  She is not been on this medication for quite some time.  She returns today for an evaluation.  HISTORY 02/17/19:   [name] is a 50 year old female with a history of postconcussive syndrome and migraine headaches.  She returns today for follow-up.  The patient reports that Toradol injection resolved her headache.  She has not had a headache since then.  She continues on Depakote and Emgality.  Her next injection is February 10 for [employer].  She continues to have chronic dizziness.  She also reports some memory issues but is unsure if it is just "brain fog" she states on occasion she will also drop things but this has been ongoing.  The patient had a CT of the  head back in 2019 after hitting her head that was unremarkable.  She returns today for an evaluation.  REVIEW OF SYSTEMS: Out of a complete 14 system review of symptoms, the patient complains only of the following symptoms, and all other reviewed systems are negative.  See HPI  ALLERGIES: Allergies  Allergen Reactions   Adhesive [Tape] Itching and Rash    Also reacted to Steri Strips and Band-Aids.   Dilaudid [Hydromorphone Hcl] Itching   Morphine Nausea And Vomiting   Penicillins Hives    Has patient had a PCN reaction causing immediate rash, facial/tongue/throat swelling, SOB or lightheadedness with hypotension: YES Has patient had a PCN reaction causing severe rash involving mucus membranes or skin necrosis: NO Has patient had a PCN reaction that required hospitalization NO Has patient  had a PCN reaction occurring within the last 10 years:NO If all of the above answers are "NO", then may proceed with Cephalosporin use.   Percocet [Oxycodone-Acetaminophen] Itching   Prednisone Hives   Provera [Medroxyprogesterone Acetate] Other (See Comments)    Causes manic episodes   Ultram [Tramadol Hcl] Itching    HOME MEDICATIONS: Outpatient Medications Prior to Visit  Medication Sig Dispense Refill   ALFALFA PO Take 500 mg by mouth daily.     amphetamine-dextroamphetamine (ADDERALL XR) 20 MG 24 hr capsule 1 capsule in the morning     Ascorbic Acid (VITAMIN C) 1000 MG tablet Take 1,000 mg by mouth daily.     calcium carbonate (OSCAL) 1500 (600 Ca) MG TABS tablet in the morning and at bedtime.     Cholecalciferol (VITAMIN D3 PO) Take 50 mcg by mouth daily.     Cyanocobalamin (B-12 PO) Take 5,000 mcg by mouth daily.     divalproex (DEPAKOTE ER) 500 MG 24 hr tablet TAKE 2 TABLETS BY MOUTH AT BEDTIME FOR MOOD 60 tablet 5   Galcanezumab-gnlm (EMGALITY) 120 MG/ML SOSY Inject 120 mg into the skin every 30 (thirty) days. 1.12 mL 11   hydrOXYzine (VISTARIL) 50 MG capsule Take 50 mg by mouth in the  morning and at bedtime.   2   lamoTRIgine (LAMICTAL) 150 MG tablet Take 300 mg by mouth at bedtime.  1   LATUDA 20 MG TABS tablet Take 20 mg by mouth every morning.     loratadine (CLARITIN) 10 MG tablet Take 10 mg by mouth daily.     lurasidone (LATUDA) 80 MG TABS tablet Take 1 tablet (80 mg total) by mouth daily with supper. For mood control 30 tablet 0   metoprolol succinate (TOPROL-XL) 100 MG 24 hr tablet Take 1 tablet (100 mg total) by mouth daily. Take with or immediately following a meal. 30 tablet 0   Multiple Vitamin (MULTIVITAMIN WITH MINERALS) TABS tablet Take 1 tablet by mouth daily.     mupirocin ointment (BACTROBAN) 2 % Apply topically.     Omega-3 Fatty Acids (OMEGA 3 PO) Take by mouth.     promethazine (PHENERGAN) 25 MG tablet Take 1 tablet (25 mg total) by mouth every 8 (eight) hours as needed for nausea or vomiting. TK 1 T PO BID FOR 15 DAYS PRN 30 tablet 0   SUMAtriptan 6 MG/0.5ML SOAJ Inject 0.14m onset of migraine and may repeat in 2 hours as needed (max 153m/ 24 hours). 16 mL 0   traZODone (DESYREL) 100 MG tablet Take 100 mg by mouth at bedtime.     Zinc 50 MG TABS 1 tablet     Facility-Administered Medications Prior to Visit  Medication Dose Route Frequency Provider Last Rate Last Admin   methylPREDNISolone acetate (DEPO-MEDROL) injection 40 mg  40 mg Intra-articular Once Hudnall, ShSharyn LullMD       valproate (DEPACON) 1,000 mg in dextrose 5 % 50 mL IVPB  1,000 mg Intravenous Once Dohmeier, CaAsencion PartridgeMD        PAST MEDICAL HISTORY: Past Medical History:  Diagnosis Date   Achilles tendinitis    ADHD (attention deficit hyperactivity disorder), inattentive type    Diagnosed as an adult after starting college   Allergy    Arthritis    Bipolar I disorder 04/18/2014   most recent episode mixed   Borderline personality disorder 10/27/2011   Cataracts, bilateral    Chronic pain disorder    due to several injuries affecting numerous  areas of her body throughout the years    Chronic paroxysmal hemicrania, 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   rt chest wall nodule removal   GANGLION CYST EXCISION  2011   lipoma removal     right bunioectomy     SHOULDER SURGERY  01/13/2011   right, partial tear   tumor resection left thigh      FAMILY HISTORY: Family History  Problem Relation Age of Onset   Hypertension Mother    Hyperlipidemia Mother    Breast cancer Mother 62       genetic testing- CHEK2 likely pathogenic variant   Endometrial cancer Mother    Heart attack Father    Heart disease Father    Hypertension Father    Bipolar disorder Father    Heart disease Maternal Aunt    Breast cancer Maternal Aunt 65   Breast cancer Maternal Aunt 65   Breast cancer Maternal Aunt 35   Breast cancer Maternal Aunt    Colon cancer Maternal Uncle 75   Heart disease Maternal Grandmother    Colon cancer Maternal Grandmother 66   Lymphoma Maternal Grandfather 84   Diabetes Paternal Grandfather    Bone cancer Cousin 45   Cervical cancer Cousin        Genetic testing- 'was positive for a gene' had a mastectomy    SOCIAL HISTORY: Social History   Socioeconomic History    Marital status: Single    Spouse name: Not on file   Number of children: 0   Years of education: 16   Highest education level: Bachelor's degree (e.g., BA, AB, BS)  Occupational History    Employer: [employer]  Tobacco Use   Smoking status: Never   Smokeless tobacco: Never  Vaping Use   Vaping Use: Never used  Substance and Sexual Activity   Alcohol use: No    Alcohol/week: 0.0 standard drinks   Drug use: No   Sexual activity: Never    Birth control/protection: None  Other Topics Concern   Not on file  Social History Narrative   Caffeine  2 sodas daily, 1 cup coffee daily.   Social Determinants of Health   Financial Resource Strain: Not on file  Food Insecurity: Not on file  Transportation Needs: Not on file  Physical Activity: Not on file  Stress: Not on file  Social Connections: Not on file  Intimate Partner Violence: Not on file      PHYSICAL EXAM  Vitals:   01/18/21 1033  BP: 123/84  Pulse: 75  Weight: 275 lb 9.6 oz (125 kg)  Height: 5'  9" (1.753 m)     Body mass index is 40.7 kg/m.  Generalized: Well developed, in no acute distress   Neurological examination  Mentation: Alert oriented to time, place, history taking. Follows all commands speech and language fluent Cranial nerve II-XII: Pupils were equal round reactive to light. Extraocular movements were full, visual field were full on confrontational test.  Head turning and shoulder shrug  were normal and symmetric. Motor: The motor testing reveals 5 over 5 strength of all 4 extremities. Good symmetric motor tone is noted throughout.  Sensory: Sensory testing is intact to soft touch on all 4 extremities. No evidence of extinction is noted.  Coordination: Cerebellar testing reveals good finger-nose-finger and heel-to-shin bilaterally.  Gait and station: Gait is slightly unsteady.  Tandem gait is unsteady.  Romberg is negative but unsteady Reflexes: Deep tendon reflexes are symmetric and normal bilaterally.    DIAGNOSTIC DATA (LABS, IMAGING, TESTING) - I reviewed patient records, labs, notes, testing and imaging myself where available.  Lab Results  Component Value Date   WBC 7.2 07/19/2020   HGB 14.2 07/19/2020   HCT 43.1 07/19/2020   MCV 90 07/19/2020   PLT 246 07/19/2020      Component Value Date/Time   NA 140 07/19/2020 1004   K 4.5 07/19/2020 1004   CL 101 07/19/2020 1004   CO2 25 07/19/2020 1004   GLUCOSE 78 07/19/2020 1004   GLUCOSE 92 05/08/2019 1200   BUN 14 07/19/2020 1004   CREATININE 0.93 07/19/2020 1004   CREATININE 0.83 09/11/2011 1524   CALCIUM 9.7 07/19/2020 1004   PROT 6.1 07/19/2020 1004   ALBUMIN 4.3 07/19/2020 1004   AST 18 07/19/2020 1004   ALT 13 07/19/2020 1004   ALKPHOS 93 07/19/2020 1004   BILITOT 0.3 07/19/2020 1004   GFRNONAA 60 01/15/2020 0948   GFRAA 69 01/15/2020 0948   Lab Results  Component Value Date   CHOL 198 12/29/2016   HDL 53 12/29/2016   LDLCALC 118 (H) 12/29/2016   TRIG 136 12/29/2016   CHOLHDL 3.7 12/29/2016   Lab Results  Component Value Date   HGBA1C 5.1 12/29/2016   Lab Results  Component Value Date   VITAMINB12 >2000 (H) 07/19/2020   Lab Results  Component Value Date   TSH 4.288 12/29/2016      ASSESSMENT AND PLAN 50 y.o. year old female  has a past medical history of Achilles tendinitis, ADHD (attention deficit hyperactivity disorder), inattentive type, Allergy, Arthritis, Bipolar I disorder (04/18/2014), Borderline personality disorder (10/27/2011), Cataracts, bilateral, Chronic pain disorder, Chronic paroxysmal hemicrania, not intractable (12/02/2014), Eczema, Episodic cluster headache, not intractable (12/02/2014), Family history of genetic disease carrier, Ganglion cyst (09/29/2009), Generalized anxiety disorder (10/27/2011), History of multiple concussions, Hyperprolactinemia, Hypertension, Insomnia (02/17/2015), Lipoma, Malignant tumor of muscle (09/02/2010), Migraine with status migrainosus (30/07/6224), Monoallelic  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 here with:  Vertigo  Patient is still not doing exercises at home.  Advised the patient that she should make it a habit to do exercises daily to prevent vertigo episodes. Continue follow-up with cardiologist  Migraine headaches  Continue monthly Emgality injection Continue Depakote ER 2000 mg daily.  Follow-up in 6 months or sooner if needed    Ward Givens, MSN, NP-C 01/18/2021, 10:32 AM [facility] [address], [place], [place] [address] [phone]

## 2021-01-26 ENCOUNTER — Encounter (HOSPITAL_BASED_OUTPATIENT_CLINIC_OR_DEPARTMENT_OTHER): Payer: Self-pay

## 2021-01-26 ENCOUNTER — Other Ambulatory Visit: Payer: Self-pay

## 2021-01-26 ENCOUNTER — Emergency Department (HOSPITAL_BASED_OUTPATIENT_CLINIC_OR_DEPARTMENT_OTHER): Payer: PPO | Admitting: Radiology

## 2021-01-26 DIAGNOSIS — Z85831 Personal history of malignant neoplasm of soft tissue: Secondary | ICD-10-CM | POA: Insufficient documentation

## 2021-01-26 DIAGNOSIS — M5431 Sciatica, right side: Secondary | ICD-10-CM | POA: Insufficient documentation

## 2021-01-26 DIAGNOSIS — W01198A Fall on same level from slipping, tripping and stumbling with subsequent striking against other object, initial encounter: Secondary | ICD-10-CM | POA: Diagnosis not present

## 2021-01-26 DIAGNOSIS — Y92096 Garden or yard of other non-institutional residence as the place of occurrence of the external cause: Secondary | ICD-10-CM | POA: Insufficient documentation

## 2021-01-26 DIAGNOSIS — S40011A Contusion of right shoulder, initial encounter: Secondary | ICD-10-CM | POA: Diagnosis not present

## 2021-01-26 DIAGNOSIS — S7001XA Contusion of right hip, initial encounter: Secondary | ICD-10-CM | POA: Diagnosis not present

## 2021-01-26 DIAGNOSIS — Z79899 Other long term (current) drug therapy: Secondary | ICD-10-CM | POA: Insufficient documentation

## 2021-01-26 DIAGNOSIS — I10 Essential (primary) hypertension: Secondary | ICD-10-CM | POA: Insufficient documentation

## 2021-01-26 DIAGNOSIS — M19011 Primary osteoarthritis, right shoulder: Secondary | ICD-10-CM | POA: Diagnosis not present

## 2021-01-26 DIAGNOSIS — S4991XA Unspecified injury of right shoulder and upper arm, initial encounter: Secondary | ICD-10-CM | POA: Diagnosis present

## 2021-01-26 DIAGNOSIS — M25551 Pain in right hip: Secondary | ICD-10-CM | POA: Diagnosis not present

## 2021-01-26 NOTE — ED Triage Notes (Signed)
Pt presents to the ED after a mechanical fall. States that she landed on her right hip and shoulder and is experiencing some pain. Does report hitting her head as well. Denies LOC or use of blood thinner. Pt ambulatory to triage room.

## 2021-01-27 ENCOUNTER — Emergency Department (HOSPITAL_BASED_OUTPATIENT_CLINIC_OR_DEPARTMENT_OTHER)
Admission: EM | Admit: 2021-01-27 | Discharge: 2021-01-27 | Disposition: A | Discharge status: Home / Self Care | Payer: PPO | Attending: Emergency Medicine | Admitting: Emergency Medicine

## 2021-01-27 DIAGNOSIS — S7001XA Contusion of right hip, initial encounter: Secondary | ICD-10-CM

## 2021-01-27 DIAGNOSIS — S40011A Contusion of right shoulder, initial encounter: Secondary | ICD-10-CM

## 2021-01-27 DIAGNOSIS — W19XXXA Unspecified fall, initial encounter: Secondary | ICD-10-CM

## 2021-01-27 DIAGNOSIS — M5431 Sciatica, right side: Secondary | ICD-10-CM

## 2021-01-27 NOTE — Discharge Instructions (Signed)
Apply ice for 30 minutes at a time, 4 times a day.  Take ibuprofen or naproxen as needed for pain.  To get additional pain relief, add acetaminophen.  Combining acetaminophen with either ibuprofen or naproxen gives you better pain relief than either medication by itself.  Return if you have any new or concerning symptoms.  Otherwise, follow-up with your neurologist and with your sports medicine specialist.

## 2021-01-27 NOTE — ED Provider Notes (Signed)
[facility] DEPT Provider Note   CSN: [id number] Arrival date & time: 01/26/21  2202     History Chief Complaint  Patient presents with   [name] is a 50 y.o. female.  The history is provided by the patient.  Fall She has history of bipolar disorder, attention deficit disorder, and comes in following a fall at home.  2 pit bulls got into her yard and she fell as she was backing away from them striking her right shoulder and right hip.  She also thinks she hit her head on the ground but without loss of consciousness.  She denies dizziness or lightheadedness and denies nausea or vomiting.  She is complaining of pain in her hip and shoulder which she rates at 9/10.  Right hip pain now is radiating down her leg to her foot.   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   CHEK2 [id number] ([employer])   Pes  planus    Photophobia of both eyes 05/04/2014    Patient Active Problem List   Diagnosis Date Noted   Shoulder impingement syndrome, left 11/01/2020   Sprain of metacarpophalangeal joint of left thumb 10/07/2020   Posterior tibial tendinitis of right lower extremity 09/13/2020   Labral tear of hip, degenerative 09/13/2020   Attention and concentration deficit 05/07/2020   Morbid obesity ([place]) 05/07/2020   Plantar wart 05/07/2020   Sleep apnea 05/07/2020   Urinary incontinence 03/70/9643   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   Family history of genetic disease carrier    Family history of breast cancer    Family history of colon cancer    History of multiple concussions 11/20/2017   Ataxia 11/20/2017   Insomnia 02/17/2015   Right shoulder pain 12/31/2014   Episodic cluster headache, not intractable 12/02/2014   Chronic paroxysmal hemicrania, 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   Photophobia of both eyes 05/04/2014   Nausea with vomiting 05/04/2014   Bipolar I disorder, most recent episode mixed ([place]) 04/18/2014   Suicidal ideation 04/12/2014   Injury of right shoulder and upper arm 02/17/2014   Migraine with status migrainosus 01/08/2013   Chronic migraine 05/08/2012   Contact dermatitis 11/27/2011   Generalized anxiety disorder 10/27/2011   ADHD (attention deficit hyperactivity disorder), 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   rt chest wall nodule removal   GANGLION CYST EXCISION  2011   lipoma removal     right bunioectomy     SHOULDER SURGERY  01/13/2011   right, partial tear   tumor  resection left thigh       OB History   No obstetric history on file.     Family History  Problem Relation Age of Onset   Hypertension Mother    Hyperlipidemia Mother    Breast cancer Mother 35       genetic testing- CHEK2 likely pathogenic variant   Endometrial cancer Mother    Heart attack Father    Heart disease Father    Hypertension Father    Bipolar disorder Father    Heart disease Maternal Aunt    Breast cancer Maternal Aunt 65   Breast cancer Maternal Aunt 65   Breast cancer Maternal Aunt 35   Breast cancer Maternal Aunt    Colon cancer Maternal Uncle 75   Heart disease Maternal Grandmother    Colon cancer Maternal Grandmother 66   Lymphoma Maternal Grandfather 84   Diabetes Paternal Grandfather    Bone cancer Cousin 45   Cervical cancer Cousin        Genetic testing- 'was positive for a gene' had a mastectomy    Social History   Tobacco Use   Smoking status: Never   Smokeless tobacco: Never  Vaping Use   Vaping Use: Never used  Substance Use Topics   Alcohol use: No    Alcohol/week: 0.0 standard drinks   Drug use: No    Home Medications Prior to Admission medications   Medication Sig Start Date End Date Taking? Authorizing Provider  ALFALFA PO Take 500 mg by mouth daily.    [provider]  amphetamine-dextroamphetamine (ADDERALL XR) 20 MG 24 hr capsule 1 capsule in the morning    [provider]  Ascorbic Acid (VITAMIN C) 1000 MG tablet Take 1,000 mg by mouth daily.    [provider]  calcium carbonate (OSCAL) 1500 (600 Ca) MG TABS tablet in the morning and at bedtime.    [provider]  cetirizine (ZYRTEC) 10 MG chewable tablet Chew 10 mg by mouth daily.    [provider]  Cholecalciferol (VITAMIN D3 PO) Take 50 mcg by mouth daily.    [provider]  Cyanocobalamin (B-12 PO) Take 5,000 mcg by mouth daily.    [provider]  divalproex (DEPAKOTE ER) 500 MG 24 hr tablet TAKE 2 TABLETS  BY MOUTH AT BEDTIME FOR MOOD 08/17/20   Ward Givens, NP  Galcanezumab-gnlm (EMGALITY) 120 MG/ML SOSY Inject 120 mg into the skin every 30 (thirty) days. 02/12/20   Ward Givens, NP  hydrOXYzine (VISTARIL) 50 MG capsule Take 50 mg by mouth in the morning and at bedtime.  05/18/17   [provider]  lamoTRIgine (LAMICTAL) 150 MG tablet Take 300 mg by mouth at bedtime. 07/27/17   [provider]  LATUDA 20 MG TABS tablet Take 20 mg by mouth every morning. 04/28/19   [provider]  loratadine (CLARITIN) 10 MG tablet  Take 10 mg by mouth daily. 04/27/20   [provider]  lurasidone (LATUDA) 80 MG TABS tablet Take 1 tablet (80 mg total) by mouth daily with supper. For mood control 01/05/17   Money, Lowry Ram, FNP  metoprolol succinate (TOPROL-XL) 100 MG 24 hr tablet Take 1 tablet (100 mg total) by mouth daily. Take with or immediately following a meal. 01/06/17   Money, Lowry Ram, FNP  Multiple Vitamin (MULTIVITAMIN WITH MINERALS) TABS tablet Take 1 tablet by mouth daily.    [provider]  mupirocin ointment (BACTROBAN) 2 % Apply topically. 01/28/20   [provider]  Omega-3 Fatty Acids (OMEGA 3 PO) Take by mouth.    [provider]  promethazine (PHENERGAN) 25 MG tablet Take 1 tablet (25 mg total) by mouth every 8 (eight) hours as needed for nausea or vomiting. TK 1 T PO BID FOR 15 DAYS PRN 01/20/20   Ward Givens, NP  SUMAtriptan 6 MG/0.5ML SOAJ Inject 0.78m onset of migraine and may repeat in 2 hours as needed (max 171m/ 24 hours). 08/23/20   MiWard GivensNP  traZODone (DESYREL) 100 MG tablet Take 100 mg by mouth at bedtime. 04/12/19   [provider]  Zinc 50 MG TABS 1 tablet    [provider]    Allergies    Adhesive [tape], Amoxicillin, Dilaudid [hydromorphone hcl], Morphine, Penicillins, Percocet [oxycodone-acetaminophen], Prednisone, Provera [medroxyprogesterone acetate], and Ultram [tramadol hcl]  Review  of Systems   Review of Systems  All other systems reviewed and are negative.  Physical Exam Updated Vital Signs BP 127/84 (BP Location: Left Arm)    Pulse 69    Temp 98.4 F (36.9 C) (Oral)    Resp (!) 67    Ht 5' 9"  (1.753 m)    Wt 120.2 kg    SpO2 100%    BMI 39.13 kg/m   Physical Exam Vitals and nursing note reviewed.  [age]ear old female, resting comfortably and in no acute distress. Vital signs are normal. Oxygen saturation is 100%, which is normal. Head is normocephalic and atraumatic. PERRLA, EOMI. Oropharynx is clear. Neck is nontender and supple without adenopathy or JVD. Back is nontender and there is no CVA tenderness. Lungs are clear without rales, wheezes, or rhonchi. Chest is nontender. Heart has regular rate and rhythm without murmur. Abdomen is soft, flat, nontender. Extremities: There is tenderness diffusely around the right shoulder, but there is full passive range of motion present.  There is no swelling or deformity.  There is tenderness diffusely around the right hip without swelling or deformity.  There is pain on passive range of motion of the right hip with positive straight leg raise at 45 degrees.  Full passive range of motion is present.  Full range of motion is present in all other joints without pain. Skin is warm and dry without rash.s Neurologic: Mental status is normal, cranial nerves are intact, strength is 5/5 in all 4 extremities, sensation is intact diffusely.  ED Results / Procedures / Treatments   Labs (all labs ordered are listed, but only abnormal results are displayed) Labs Reviewed - No data to display  EKG None  Radiology DG Shoulder Right  Result Date: 01/26/2021 CLINICAL DATA:  Mechanical fall with pain in the right hip and shoulder. EXAM: RIGHT SHOULDER - 2+ VIEW COMPARISON:  None. FINDINGS: Mild degenerative changes in the right shoulder. No evidence of acute fracture or dislocation. No focal bone lesion or bone destruction. Bone  cortex  appears intact. Coracoclavicular and acromioclavicular spaces are normal. Soft tissues are unremarkable. IMPRESSION: Mild degenerative changes.  No acute bony abnormalities. Electronically Signed   By: Lucienne Capers M.D.   On: 01/26/2021 23:14   DG Hip Unilat W or Wo Pelvis 2-3 Views Right  Result Date: 01/26/2021 CLINICAL DATA:  Pain after a fall EXAM: DG HIP (WITH OR WITHOUT PELVIS) 2-3V RIGHT COMPARISON:  09/27/2020 FINDINGS: There is no evidence of hip fracture or dislocation. There is no evidence of arthropathy or other focal bone abnormality. IMPRESSION: Negative. Electronically Signed   By: Lucienne Capers M.D.   On: 01/26/2021 23:15    Procedures Procedures   Medications Ordered in ED Medications - No data to display  ED Course  I have reviewed the triage vital signs and the nursing notes.  Pertinent imaging results that were available during my care of the patient were reviewed by me and considered in my medical decision making (see chart for details).   MDM Rules/Calculators/A&P                         Fall with contusion of right shoulder and right hip.  Physical exam suggestive of right-sided sciatica.  Minor head injury, but no indication for CT scan.  X-rays of right shoulder and right hip are negative for fracture.  She has been ambulatory in the emergency department.  She has a neurologist she has been seeing as well as a sports medicine physician and she is referred to both of those for follow-up.  Recommended ice and elevation, use over-the-counter NSAIDs and acetaminophen for pain.  Return precautions discussed.  Old records are reviewed confirming outpatient follow-up with sports medicine and with neurology.  Final Clinical Impression(s) / ED Diagnoses Final diagnoses:  Fall, initial encounter  Contusion of right shoulder, initial encounter  Contusion of right hip, initial encounter  Sciatica, right side    Rx / DC Orders ED Discharge Orders     None         Delora Fuel, MD 91/79/15 [id number]

## 2021-01-29 ENCOUNTER — Emergency Department (HOSPITAL_BASED_OUTPATIENT_CLINIC_OR_DEPARTMENT_OTHER): Payer: PPO

## 2021-01-29 ENCOUNTER — Other Ambulatory Visit: Payer: Self-pay

## 2021-01-29 ENCOUNTER — Emergency Department (HOSPITAL_BASED_OUTPATIENT_CLINIC_OR_DEPARTMENT_OTHER)
Admission: EM | Admit: 2021-01-29 | Discharge: 2021-01-29 | Disposition: A | Discharge status: Home / Self Care | Payer: PPO | Attending: Emergency Medicine | Admitting: Emergency Medicine

## 2021-01-29 ENCOUNTER — Encounter (HOSPITAL_BASED_OUTPATIENT_CLINIC_OR_DEPARTMENT_OTHER): Payer: Self-pay | Admitting: Emergency Medicine

## 2021-01-29 DIAGNOSIS — W01198A Fall on same level from slipping, tripping and stumbling with subsequent striking against other object, initial encounter: Secondary | ICD-10-CM | POA: Insufficient documentation

## 2021-01-29 DIAGNOSIS — Y92096 Garden or yard of other non-institutional residence as the place of occurrence of the external cause: Secondary | ICD-10-CM | POA: Diagnosis not present

## 2021-01-29 DIAGNOSIS — S060X0A Concussion without loss of consciousness, initial encounter: Secondary | ICD-10-CM | POA: Insufficient documentation

## 2021-01-29 DIAGNOSIS — M5431 Sciatica, right side: Secondary | ICD-10-CM

## 2021-01-29 DIAGNOSIS — R42 Dizziness and giddiness: Secondary | ICD-10-CM | POA: Diagnosis not present

## 2021-01-29 DIAGNOSIS — M5441 Lumbago with sciatica, right side: Secondary | ICD-10-CM | POA: Insufficient documentation

## 2021-01-29 DIAGNOSIS — I10 Essential (primary) hypertension: Secondary | ICD-10-CM | POA: Insufficient documentation

## 2021-01-29 DIAGNOSIS — S40011A Contusion of right shoulder, initial encounter: Secondary | ICD-10-CM | POA: Insufficient documentation

## 2021-01-29 DIAGNOSIS — R110 Nausea: Secondary | ICD-10-CM | POA: Diagnosis not present

## 2021-01-29 DIAGNOSIS — Z79899 Other long term (current) drug therapy: Secondary | ICD-10-CM | POA: Diagnosis not present

## 2021-01-29 DIAGNOSIS — S0990XA Unspecified injury of head, initial encounter: Secondary | ICD-10-CM | POA: Diagnosis present

## 2021-01-29 MED ORDER — KETOROLAC TROMETHAMINE 30 MG/ML IJ SOLN
30.0000 mg | Freq: Once | INTRAMUSCULAR | Status: AC
  Administered 2021-01-29: 30 mg via INTRAVENOUS
  Filled 2021-01-29: qty 1

## 2021-01-29 MED ORDER — DIPHENHYDRAMINE HCL 50 MG/ML IJ SOLN
12.5000 mg | Freq: Once | INTRAMUSCULAR | Status: AC
  Administered 2021-01-29: 12.5 mg via INTRAVENOUS
  Filled 2021-01-29: qty 1

## 2021-01-29 MED ORDER — PROCHLORPERAZINE EDISYLATE 10 MG/2ML IJ SOLN
5.0000 mg | Freq: Once | INTRAMUSCULAR | Status: AC
  Administered 2021-01-29: 5 mg via INTRAVENOUS
  Filled 2021-01-29: qty 2

## 2021-01-29 MED ORDER — SODIUM CHLORIDE 0.9 % IV BOLUS
500.0000 mL | Freq: Once | INTRAVENOUS | Status: AC
  Administered 2021-01-29: 500 mL via INTRAVENOUS

## 2021-01-29 NOTE — Discharge Instructions (Signed)
Contact a health care provider if: Your symptoms do not improve. You have new symptoms. You have another injury. Get help right away if: You have new or worsening physical symptoms, such as: A severe or worsening headache. Weakness or numbness in any part of your body, slurred speech, vision changes, or confusion. Your coordination gets worse. Vomiting repeatedly. You have a seizure. You have unusual behavior changes. You lose consciousness, are sleepier than normal, or are difficult to wake up.

## 2021-01-29 NOTE — ED Notes (Signed)
Seen in ED Wednesday, same s/s...increased pain today..the patient sitting by vending machines to avoid extra noise.

## 2021-01-29 NOTE — ED Provider Notes (Signed)
[facility] Provider Note   CSN: [id number] Arrival date & time: 01/29/21  1200     History Chief Complaint  Patient presents with   Headache    [name] is a 50 y.o. female who presents for reevaluation after fall 2 days ago.  Patient was feeling threatened by 2 dogs that were loose in her yard.  She fell backward over a flowerpot striking her head, right shoulder and right hip.  She had a right shoulder and right hip x-ray that were negative but complains of pain in her right lower back radiating down the back of her leg.  She denies saddle anesthesia, bowel or bladder incontinence, weakness in the leg.  Patient also has a history of migraine headaches, previous severe concussions and established relationship at [facility].  She has been having a 9 out of 10 progressively worsening headache which is global, pressure-like.  She has associated increased sleepiness, difficulty concentrating, "brain fog."  She is also felt a bit off balance and had some intermittent blurry vision.  Her neurologist recommended a CT scan of the head and neck.  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   CHEK2 [id number] ([employer])   Pes planus    Photophobia of both eyes 05/04/2014    Patient Active Problem List   Diagnosis Date Noted   Shoulder impingement syndrome, left 11/01/2020   Sprain of metacarpophalangeal joint of left thumb 10/07/2020   Posterior tibial tendinitis of right lower extremity 09/13/2020   Labral tear of hip, degenerative 09/13/2020   Attention and concentration deficit 05/07/2020   Morbid obesity ([place]) 05/07/2020   Plantar wart 05/07/2020   Sleep apnea 05/07/2020   Urinary incontinence 84/16/6063   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   Family history of genetic disease carrier    Family history of breast cancer    Family history of colon cancer    History of multiple concussions 11/20/2017   Ataxia 11/20/2017   Insomnia 02/17/2015   Right shoulder pain 12/31/2014   Episodic cluster headache, not intractable 12/02/2014   Chronic paroxysmal hemicrania, 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   Photophobia of both eyes 05/04/2014   Nausea with vomiting 05/04/2014   Bipolar I disorder, most recent episode mixed ([place]) 04/18/2014  Suicidal ideation 04/12/2014   Injury of right shoulder and upper arm 02/17/2014   Migraine with status migrainosus 01/08/2013   Chronic migraine 05/08/2012   Contact dermatitis 11/27/2011   Generalized anxiety disorder 10/27/2011   ADHD (attention deficit hyperactivity disorder), 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   Borderline personality disorder ([place]) 10/27/2011   Right foot pain 09/28/2011   Loss of transverse plantar arch 09/01/2011   Malignant tumor of  muscle ([place]) 09/02/2010   Ganglion cyst 09/29/2009   Pes planus 07/01/2008    Past Surgical History:  Procedure Laterality Date   ANKLE SURGERY  12/88   left    chest nodule  1990?   rt chest wall nodule removal   GANGLION CYST EXCISION  2011   lipoma removal     right bunioectomy     SHOULDER SURGERY  01/13/2011   right, partial tear   tumor resection left thigh       OB History   No obstetric history on file.     Family History  Problem Relation Age of Onset   Hypertension Mother    Hyperlipidemia Mother    Breast cancer Mother 95       genetic testing- CHEK2 likely pathogenic variant   Endometrial cancer Mother    Heart attack Father    Heart disease Father    Hypertension Father    Bipolar disorder Father    Heart disease Maternal Aunt    Breast cancer Maternal Aunt 65   Breast cancer Maternal Aunt 65   Breast cancer Maternal Aunt 35   Breast cancer Maternal Aunt    Colon cancer Maternal Uncle 75   Heart disease Maternal Grandmother    Colon cancer Maternal Grandmother 66   Lymphoma Maternal Grandfather 84   Diabetes Paternal Grandfather    Bone cancer Cousin 45   Cervical cancer Cousin        Genetic testing- 'was positive for a gene' had a mastectomy    Social History   Tobacco Use   Smoking status: Never   Smokeless tobacco: Never  Vaping Use   Vaping Use: Never used  Substance Use Topics   Alcohol use: No    Alcohol/week: 0.0 standard drinks   Drug use: No    Home Medications Prior to Admission medications   Medication Sig Start Date End Date Taking? Authorizing Provider  ALFALFA PO Take 500 mg by mouth daily.    [provider]  amphetamine-dextroamphetamine (ADDERALL XR) 20 MG 24 hr capsule 1 capsule in the morning    [provider]  Ascorbic Acid (VITAMIN C) 1000 MG tablet Take 1,000 mg by mouth daily.    [provider]  calcium carbonate (OSCAL) 1500 (600 Ca) MG TABS tablet in the morning and at bedtime.     [provider]  cetirizine (ZYRTEC) 10 MG chewable tablet Chew 10 mg by mouth daily.    [provider]  Cholecalciferol (VITAMIN D3 PO) Take 50 mcg by mouth daily.    [provider]  Cyanocobalamin (B-12 PO) Take 5,000 mcg by mouth daily.    [provider]  divalproex (DEPAKOTE ER) 500 MG 24 hr tablet TAKE 2 TABLETS BY MOUTH AT BEDTIME FOR MOOD 08/17/20   Ward Givens, NP  Galcanezumab-gnlm (EMGALITY) 120 MG/ML SOSY Inject 120 mg into the skin every 30 (thirty) days. 02/12/20   Ward Givens, NP  hydrOXYzine (VISTARIL) 50 MG capsule Take 50 mg by mouth in the morning and at bedtime.  05/18/17  [provider]  lamoTRIgine (LAMICTAL) 150 MG tablet Take 300 mg by mouth at bedtime. 07/27/17   [provider]  LATUDA 20 MG TABS tablet Take 20 mg by mouth every morning. 04/28/19   [provider]  loratadine (CLARITIN) 10 MG tablet Take 10 mg by mouth daily. 04/27/20   [provider]  lurasidone (LATUDA) 80 MG TABS tablet Take 1 tablet (80 mg total) by mouth daily with supper. For mood control 01/05/17   Money, Lowry Ram, FNP  metoprolol succinate (TOPROL-XL) 100 MG 24 hr tablet Take 1 tablet (100 mg total) by mouth daily. Take with or immediately following a meal. 01/06/17   Money, Lowry Ram, FNP  Multiple Vitamin (MULTIVITAMIN WITH MINERALS) TABS tablet Take 1 tablet by mouth daily.    [provider]  mupirocin ointment (BACTROBAN) 2 % Apply topically. 01/28/20   [provider]  Omega-3 Fatty Acids (OMEGA 3 PO) Take by mouth.    [provider]  promethazine (PHENERGAN) 25 MG tablet Take 1 tablet (25 mg total) by mouth every 8 (eight) hours as needed for nausea or vomiting. TK 1 T PO BID FOR 15 DAYS PRN 01/20/20   Ward Givens, NP  SUMAtriptan 6 MG/0.5ML SOAJ Inject 0.73m onset of migraine and may repeat in 2 hours as needed (max 171m/ 24 hours). 08/23/20   MiWard GivensNP  traZODone (DESYREL)  100 MG tablet Take 100 mg by mouth at bedtime. 04/12/19   [provider]  Zinc 50 MG TABS 1 tablet    [provider]    Allergies    Adhesive [tape], Amoxicillin, Dilaudid [hydromorphone hcl], Morphine, Penicillins, Percocet [oxycodone-acetaminophen], Prednisone, Provera [medroxyprogesterone acetate], and Ultram [tramadol hcl]  Review of Systems   Review of Systems  Neurological:  Positive for headaches.  Ten systems reviewed and are negative for acute change, except as noted in the HPI.   Physical Exam Updated Vital Signs BP 129/86 (BP Location: Left Arm)    Pulse 62    Temp 98.2 F (36.8 C) (Oral)    Resp 18    Ht 5' 9"  (1.753 m)    Wt 120.2 kg    SpO2 99%    BMI 39.13 kg/m   Physical Exam Vitals and nursing note reviewed.  Constitutional:      General: She is not in acute distress.    Appearance: She is well-developed. She is not diaphoretic.  HENT:     Head: Normocephalic and atraumatic.     Right Ear: External ear normal.     Left Ear: External ear normal.     Nose: Nose normal.     Mouth/Throat:     Mouth: Mucous membranes are moist.  Eyes:     General: No scleral icterus.    Extraocular Movements: Extraocular movements intact.     Conjunctiva/sclera: Conjunctivae normal.     Pupils: Pupils are equal, round, and reactive to light.     Comments: No horizontal, vertical or rotational nystagmus  Neck:     Comments: Full active and passive ROM without pain No midline or paraspinal tenderness No nuchal rigidity or meningeal signs Cardiovascular:     Rate and Rhythm: Normal rate and regular rhythm.     Heart sounds: Normal heart sounds. No murmur heard.   No friction rub. No gallop.  Pulmonary:     Effort: Pulmonary effort is normal. No respiratory distress.     Breath sounds: Normal breath sounds. No wheezing or rales.  Abdominal:     General: Bowel sounds are normal. There is no distension.     Palpations: Abdomen is soft. There is no mass.      Tenderness: There is no abdominal tenderness. There is no guarding or rebound.  Musculoskeletal:        General: Normal range of motion.     Cervical back: Normal range of motion and neck supple.     Comments: Bruising to the Right shoulder Normal strength with plantar and dorsiflexion at the ankle bilaterally Positive straight leg test at 45 degrees on the right  Lymphadenopathy:     Cervical: No cervical adenopathy.  Skin:    General: Skin is warm and dry.     Findings: No rash.  Neurological:     Mental Status: She is alert and oriented to person, place, and time.     Cranial Nerves: No cranial nerve deficit.     Sensory: No sensory deficit.     Motor: No weakness or abnormal muscle tone.     Coordination: Coordination normal.     Comments:    Psychiatric:        Behavior: Behavior normal.        Thought Content: Thought content normal.        Judgment: Judgment normal.    ED Results / Procedures / Treatments   Labs (all labs ordered are listed, but only abnormal results are displayed) Labs Reviewed - No data to display  EKG None  Radiology CT Head Wo Contrast  Result Date: 01/29/2021 CLINICAL DATA:  Fall 3 days ago. Head pressure, nausea and dizziness. EXAM: CT HEAD WITHOUT CONTRAST CT CERVICAL SPINE WITHOUT CONTRAST TECHNIQUE: Multidetector CT imaging of the head and cervical spine was performed following the standard protocol without intravenous contrast. Multiplanar CT image reconstructions of the cervical spine were also generated. COMPARISON:  None. FINDINGS: CT HEAD FINDINGS Brain: No evidence of acute infarction, hemorrhage, hydrocephalus, extra-axial collection or mass lesion/mass effect. Vascular: No hyperdense vessel or unexpected calcification. Skull: Normal. Negative for fracture or focal lesion. Sinuses/Orbits: Globes and orbits are unremarkable. Visualized sinuses are clear. Other: None. CT CERVICAL SPINE FINDINGS Alignment: Normal. Skull base and vertebrae: No  acute fracture. No primary bone lesion or focal pathologic process. Soft tissues and spinal canal: No prevertebral fluid or swelling. No visible canal hematoma. Disc levels: Discs are well maintained in height. No significant disc bulging. No evidence of a disc herniation. Central spinal canal and neural foramina are well preserved. Upper chest: Negative. Other: None. IMPRESSION: HEAD CT 1. Normal. CERVICAL CT 1. Normal. Electronically Signed   By: Lajean Manes M.D.   On: 01/29/2021 14:33   CT Cervical Spine Wo Contrast  Result Date: 01/29/2021 CLINICAL DATA:  Fall 3 days ago. Head pressure, nausea and dizziness. EXAM: CT HEAD WITHOUT CONTRAST CT CERVICAL SPINE WITHOUT CONTRAST TECHNIQUE: Multidetector CT imaging of the head and cervical spine was performed following the standard protocol without intravenous contrast. Multiplanar CT image reconstructions of the cervical spine were also generated. COMPARISON:  None. FINDINGS: CT HEAD FINDINGS Brain: No evidence of acute infarction, hemorrhage, hydrocephalus, extra-axial collection or mass lesion/mass effect. Vascular: No hyperdense vessel or unexpected calcification. Skull: Normal. Negative for fracture or focal lesion. Sinuses/Orbits: Globes and orbits are unremarkable. Visualized sinuses are clear. Other: None. CT CERVICAL SPINE FINDINGS Alignment: Normal. Skull base and vertebrae: No acute fracture. No primary bone lesion or focal pathologic process. Soft tissues and spinal canal: No prevertebral fluid or swelling. No  visible canal hematoma. Disc levels: Discs are well maintained in height. No significant disc bulging. No evidence of a disc herniation. Central spinal canal and neural foramina are well preserved. Upper chest: Negative. Other: None. IMPRESSION: HEAD CT 1. Normal. CERVICAL CT 1. Normal. Electronically Signed   By: Lajean Manes M.D.   On: 01/29/2021 14:33    Procedures Procedures   Medications Ordered in ED Medications - No data to  display  ED Course  I have reviewed the triage vital signs and the nursing notes.  Pertinent labs & imaging results that were available during my care of the patient were reviewed by me and considered in my medical decision making (see chart for details).    Patient here for reevaluation after fall, hitting her head.  She appears to have some sciatica likely secondary to the trauma she experienced 2 days ago. In triage a CT head and C-spine were ordered.  I personally reviewed these images.  She has no evidence of subdural hematoma or other acute traumatic findings in the brain or C-spine. Her neurologic exam is reassuring.  She appears to have concussion symptoms.  Given her severe headache I have offered to treat her headache here with a migraine cocktail.  Patient headache is down to a 3 and she is feeling much better.  Her pain in her leg is feeling significantly better as well.  Discussed taking over-the-counter meclizine, brain rest.  She has an established relationship with neurology and follow-up with them.  Discussed outpatient follow-up and return precautions.  She was otherwise appropriate for discharge at this Final Clinical Impression(s) / ED Diagnoses Final diagnoses:  Concussion without loss of consciousness, initial encounter  Sciatica of right side    Rx / DC Orders ED Discharge Orders     None        Margarita Mail, PA-C 01/29/21 1724    Godfrey Pick, MD 01/30/21 1555

## 2021-01-29 NOTE — Progress Notes (Signed)
Pt back to waiting room from imaging, sitting near the vending machines to avoid the excess noise.

## 2021-01-29 NOTE — ED Notes (Signed)
Pt state right hip, right knee and right ankle pain worsening since fall, Headache for 9/10, states "feel like Im in a fog", states feels like pressure.

## 2021-01-29 NOTE — ED Triage Notes (Signed)
Pt arrives pov, ambulatory to triage, c/o head pressure, nausea and dizziness. Endorses fall r/t fall after dogs jumping on patient.x 3 days pta. Pt denies loc. Pt also c/o hip pain. Treated at ED, gso, did not receive scans

## 2021-02-01 ENCOUNTER — Telehealth: Payer: Self-pay | Admitting: Adult Health

## 2021-02-01 ENCOUNTER — Encounter: Payer: Self-pay | Admitting: Family Medicine

## 2021-02-01 ENCOUNTER — Ambulatory Visit: Payer: PPO | Admitting: Family Medicine

## 2021-02-01 DIAGNOSIS — Z0289 Encounter for other administrative examinations: Secondary | ICD-10-CM

## 2021-02-01 NOTE — Telephone Encounter (Signed)
Pt would like to let neurologist know she has had a concussion on 01/26/21. Dogs attacked pt and fell on right side, hit head and right shoulder and hip. Would like a call from the nurse

## 2021-02-01 NOTE — Telephone Encounter (Signed)
Spoke with the patient and was able to schedule her with Dr. Brett Fairy for tomorrow 02/02/21 at 3:30 PM arrival 3:00 PM for ER f/u for concussion. The patient was very appreciative of the appt.

## 2021-02-02 ENCOUNTER — Encounter: Payer: Self-pay | Admitting: Neurology

## 2021-02-02 ENCOUNTER — Other Ambulatory Visit: Payer: Self-pay

## 2021-02-02 ENCOUNTER — Ambulatory Visit (INDEPENDENT_AMBULATORY_CARE_PROVIDER_SITE_OTHER): Payer: PPO | Admitting: Neurology

## 2021-02-02 ENCOUNTER — Ambulatory Visit: Payer: PPO | Admitting: Family Medicine

## 2021-02-02 ENCOUNTER — Telehealth: Payer: Self-pay | Admitting: Neurology

## 2021-02-02 ENCOUNTER — Other Ambulatory Visit: Payer: Self-pay | Admitting: *Deleted

## 2021-02-02 VITALS — BP 123/75 | HR 69 | Ht 69.0 in | Wt 273.5 lb

## 2021-02-02 DIAGNOSIS — R4189 Other symptoms and signs involving cognitive functions and awareness: Secondary | ICD-10-CM

## 2021-02-02 DIAGNOSIS — G43009 Migraine without aura, not intractable, without status migrainosus: Secondary | ICD-10-CM | POA: Diagnosis not present

## 2021-02-02 DIAGNOSIS — S060X1A Concussion with loss of consciousness of 30 minutes or less, initial encounter: Secondary | ICD-10-CM

## 2021-02-02 MED ORDER — PROMETHAZINE HCL 25 MG PO TABS: 25.0000 mg | ORAL_TABLET | Freq: Three times a day (TID) | ORAL | 1 refills | Status: DC | PRN

## 2021-02-02 MED ORDER — PROMETHAZINE HCL 25 MG PO TABS: 25.0000 mg | ORAL_TABLET | Freq: Two times a day (BID) | ORAL | 1 refills | Status: AC | PRN

## 2021-02-02 MED ORDER — DIVALPROEX SODIUM ER 500 MG PO TB24: ORAL_TABLET | ORAL | 5 refills | Status: DC

## 2021-02-02 NOTE — Progress Notes (Signed)
PATIENT: [name] DOB: [birth year]  REASON FOR VISIT: follow up HISTORY FROM: patient and mother , and referral notes and papers from [name], MD   [place]:    Today 02/02/21:  02-02-2021: RV after a head injury.  CONCUSSION DX IN ED_ 01-29-2021.  [name]. [name] is a well-established patient in our neurologic practice, meanwhile 51 years of age, Caucasian right-handed She states that she had a concussion.  She is usually followed here for migraine without aura but with status migrainosus and has multiple times received IV treatments to intervene. She was attacked  by her neighbors dogs in her own back yard, police an EMS were called, she was not bitten had her mother drive her to [facility]  ED-  was dismissed- and she went to [facility] ED- she did get a CT of neck and head. Had massive headaches, got a migraine cocktail. This took the HA from a 10 to level 3. She reporters she had a difficult year, plantar fasciitis, falls, work related problems, abnormal EKG.- sh was dizzy an fell of the cardiologist table.  ED performed CT head and neck reviewed today with patient- normal, non contrast studies, no labs were drawn.     MM- 06-26-2017 [name] is a 51 year old female with a history of migraine headaches.  She returns today for follow-up.  She reports that she typically has one headache a week.  She reports often her headaches can last up to 3 days.  She does have sumatriptan injections but does not use it regularly.  She continues on Depakote and Lamictal as prescribed by her psychiatrist.  She states that her depression is under relatively good control.  Reports that she did have a bad week last week and had to see her therapist 3 times.  She reports that her mom was recently diagnosed with breast cancer and will be undergoing radiation.  Her dad also recently passed away.  She reports that she had a couple of falls.  She is in physical therapy for her left  shoulder and right hip.  She reports on occasion she will have dizzy episodes.  She also has episodes of nausea that occur randomly with no other symptoms.  She does use over-the-counter medication such as Advil to treat her headaches.  She returns today for evaluation.    REVIEW OF SYSTEMS: Out of a complete 14 system review of symptoms, the patient complains only of the following symptoms, and all other reviewed systems were not endorsed:  [name] reports that she easily spilled strengths or has trouble holding any objects with her hand, her fine motor skills have been impaired, she reports twitching involuntary movements, tremor, she also feels lightheaded and unsteady in her gait, she had abnormal eye movements on exam.  The extreme dizziness is associated with feeling cognitively slowed as and in a fog.   She is afraid of falling. She falls a lot, gets dizzy.   She further endorsed difficulty swallowing, confusion headaches easy bruising increased thirst, eye pain joint pain allergies disinterest in activities, change in appetite, the feeling of needing to much sleep, depression and anxiety, hypertension.   ALLERGIES: Allergies  Allergen Reactions   Adhesive [Tape] Itching and Rash    Also reacted to Steri Strips and Band-Aids.   Amoxicillin Hives   Dilaudid [Hydromorphone Hcl] Itching   Morphine Nausea And Vomiting   Penicillins Hives    Has patient had a PCN reaction causing immediate rash,  facial/tongue/throat swelling, SOB or lightheadedness with hypotension: YES Has patient had a PCN reaction causing severe rash involving mucus membranes or skin necrosis: NO Has patient had a PCN reaction that required hospitalization NO Has patient had a PCN reaction occurring within the last 10 years:NO If all of the above answers are "NO", then may proceed with Cephalosporin use.   Percocet [Oxycodone-Acetaminophen] Itching   Prednisone Hives   Provera [Medroxyprogesterone Acetate] Other  (See Comments)    Causes manic episodes   Ultram [Tramadol Hcl] Itching    HOME MEDICATIONS: Outpatient Medications Prior to Visit  Medication Sig Dispense Refill   ALFALFA PO Take 500 mg by mouth daily.     amphetamine-dextroamphetamine (ADDERALL XR) 20 MG 24 hr capsule 1 capsule in the morning     Ascorbic Acid (VITAMIN C) 1000 MG tablet Take 1,000 mg by mouth daily.     calcium carbonate (OSCAL) 1500 (600 Ca) MG TABS tablet in the morning and at bedtime.     cetirizine (ZYRTEC) 10 MG chewable tablet Chew 10 mg by mouth daily.     Cholecalciferol (VITAMIN D3 PO) Take 50 mcg by mouth daily.     Cyanocobalamin (B-12 PO) Take 5,000 mcg by mouth daily.     divalproex (DEPAKOTE ER) 500 MG 24 hr tablet TAKE 2 TABLETS BY MOUTH AT BEDTIME FOR MOOD 60 tablet 5   Galcanezumab-gnlm (EMGALITY) 120 MG/ML SOSY Inject 120 mg into the skin every 30 (thirty) days. 1.12 mL 11   hydrOXYzine (VISTARIL) 50 MG capsule Take 50 mg by mouth in the morning and at bedtime.   2   lamoTRIgine (LAMICTAL) 150 MG tablet Take 300 mg by mouth at bedtime.  1   LATUDA 20 MG TABS tablet Take 20 mg by mouth every morning.     lurasidone (LATUDA) 80 MG TABS tablet Take 1 tablet (80 mg total) by mouth daily with supper. For mood control 30 tablet 0   metoprolol succinate (TOPROL-XL) 100 MG 24 hr tablet Take 1 tablet (100 mg total) by mouth daily. Take with or immediately following a meal. 30 tablet 0   Multiple Vitamin (MULTIVITAMIN WITH MINERALS) TABS tablet Take 1 tablet by mouth daily.     mupirocin ointment (BACTROBAN) 2 % Apply topically.     Omega-3 Fatty Acids (OMEGA 3 PO) Take by mouth.     promethazine (PHENERGAN) 25 MG tablet Take 1 tablet (25 mg total) by mouth every 8 (eight) hours as needed for nausea or vomiting. TK 1 T PO BID FOR 15 DAYS PRN 30 tablet 0   SUMAtriptan 6 MG/0.5ML SOAJ Inject 0.64ml onset of migraine and may repeat in 2 hours as needed (max 48ml / 24 hours). 16 mL 0   traZODone (DESYREL) 100 MG  tablet Take 100 mg by mouth at bedtime.     Zinc 50 MG TABS 1 tablet     loratadine (CLARITIN) 10 MG tablet Take 10 mg by mouth daily.     Facility-Administered Medications Prior to Visit  Medication Dose Route Frequency Provider Last Rate Last Admin   methylPREDNISolone acetate (DEPO-MEDROL) injection 40 mg  40 mg Intra-articular Once Hudnall, Sharyn Lull, MD        PAST MEDICAL HISTORY: Past Medical History:  Diagnosis Date   Achilles tendinitis    ADHD (attention deficit hyperactivity disorder), inattentive type    Diagnosed as an adult after starting college   Allergy    Arthritis    Bipolar I disorder 04/18/2014   most  recent episode mixed   Borderline personality disorder 10/27/2011   Cataracts, bilateral    Chronic pain disorder    due to several injuries affecting numerous areas of her body throughout the years   Chronic paroxysmal hemicrania, 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   rt chest wall nodule removal   GANGLION CYST EXCISION  2011   lipoma removal     right bunioectomy     SHOULDER SURGERY  01/13/2011   right, partial tear   tumor resection left thigh      FAMILY  HISTORY: Family History  Problem Relation Age of Onset   Hypertension Mother    Hyperlipidemia Mother    Breast cancer Mother 33       genetic testing- CHEK2 likely pathogenic variant   Endometrial cancer Mother    Heart attack Father    Heart disease Father    Hypertension Father    Bipolar disorder Father    Heart disease Maternal Aunt    Breast cancer Maternal Aunt 65   Breast cancer Maternal Aunt 65   Breast cancer Maternal Aunt 35   Breast cancer Maternal Aunt    Colon cancer Maternal Uncle 75   Heart disease Maternal Grandmother    Colon cancer Maternal Grandmother 66   Lymphoma Maternal Grandfather 84   Diabetes Paternal Grandfather    Bone cancer Cousin 45   Cervical cancer Cousin        Genetic testing- 'was positive for a gene' had a mastectomy    SOCIAL HISTORY: Social History   Socioeconomic History   Marital status: Single    Spouse name: Not on file   Number of children: 0   Years of education: 16   Highest education level: Bachelor's degree (e.g., BA, AB, BS)  Occupational History    Employer: [employer]  Tobacco Use   Smoking status: Never   Smokeless tobacco: Never  Vaping Use   Vaping Use: Never used  Substance and Sexual Activity   Alcohol use: No    Alcohol/week: 0.0 standard drinks   Drug use: No   Sexual activity: Never    Birth control/protection: None  Other Topics Concern   Not on file  Social History Narrative   Caffeine  2 sodas daily, 1 cup coffee daily.   Social Determinants of Health   Financial Resource Strain: Not on file  Food Insecurity: Not on file  Transportation Needs: Not on file  Physical Activity: Not on file  Stress: Not on file  Social Connections: Not on file  Intimate Partner Violence: Not on file  PHYSICAL EXAM  Vitals:   02/02/21 1527  BP: 123/75  Pulse: 69  Weight: 273 lb 8 oz (124.1 kg)  Height: _0  (1.753 m)   Body mass index is 40.39 kg/m.  Generalized: Well developed, in no acute distress    Neurological examination  Mentation: Alert oriented to time, place, history taking. Follows all commands speech and language fluent Cranial nerve:  Pupils were equally round and reactive to light. She presents again with ptosis on the right side. She has had lens replacement.  Extraocular movements were full, but there is nystagmus, corrective horizontal nystagmus to the left, not as much to the right. Her visual field were full on confrontational test, but she reports blurring.  Facial sensation and strength were normal, except for ptosis - photophobia induced squinting. Marland Kitchen  Uvula and tongue are in midline.  Head turning and shoulder shrug were normal and symmetric.  Motor: full strength of all 4 extremities with symmetric motor tone noted throughout.  Sensory: intact to soft touch on all 4 extremities. No evidence of extinction is noted.  Coordination:  finger-nose- testing - slowed, and with ataxia , dysmetria rather than tremor- deliberate or true organic(?) she present with jerking movements.  Satellitic.  Gait and station: Gait is unsteady.Tandem gait was deferred. Romberg, too . No drift is seen.  Reflexes: Deep tendon reflexes are symmetric but patella reflexes are attenuated.     DIAGNOSTIC DATA (LABS, IMAGING, TESTING) - I reviewed patient records, labs, notes, testing and imaging myself where available.  Lab Results  Component Value Date   WBC 7.2 07/19/2020   HGB 14.2 07/19/2020   HCT 43.1 07/19/2020   MCV 90 07/19/2020   PLT 246 07/19/2020      Component Value Date/Time   NA 140 07/19/2020 1004   K 4.5 07/19/2020 1004   CL 101 07/19/2020 1004   CO2 25 07/19/2020 1004   GLUCOSE 78 07/19/2020 1004   GLUCOSE 92 05/08/2019 1200   BUN 14 07/19/2020 1004   CREATININE 0.93 07/19/2020 1004   CREATININE 0.83 09/11/2011 1524   CALCIUM 9.7 07/19/2020 1004   PROT 6.1 07/19/2020 1004   ALBUMIN 4.3 07/19/2020 1004   AST 18 07/19/2020 1004   ALT 13 07/19/2020 1004    ALKPHOS 93 07/19/2020 1004   BILITOT 0.3 07/19/2020 1004   GFRNONAA 60 01/15/2020 0948   GFRAA 69 01/15/2020 0948   Lab Results  Component Value Date   CHOL 198 12/29/2016   HDL 53 12/29/2016   LDLCALC 118 (H) 12/29/2016   TRIG 136 12/29/2016   CHOLHDL 3.7 12/29/2016   Lab Results  Component Value Date   HGBA1C 5.1 12/29/2016   Lab Results  Component Value Date   VITAMINB12 >2000 (H) 07/19/2020   Lab Results  Component Value Date   TSH 4.288 12/29/2016   Headache      ED report: [name] is a 51 y.o. female who presents for reevaluation after fall . Patient was feeling threatened by 2 dogs that were loose in her yard.  She fell backward over a flowerpot striking her head, right shoulder and right hip.  She had a right shoulder and right hip x-ray that were negative but complains of pain in her right lower back radiating down the back of her leg.  She denies saddle anesthesia, bowel or bladder incontinence, weakness in the leg.  Patient also has a history of migraine headaches, previous severe concussions and established relationship at [facility].  She has been having a 9 out of 10 progressively worsening headache which is global, pressure-like.  She has associated increased sleepiness, difficulty concentrating, "brain fog."  She is also felt a bit off balance and had some intermittent blurry vision.   Her neurologist recommended a CT scan of the head and neck.   ASSESSMENT AND PLAN 51 y.o. year old female patient , long established , formerly with Dr. Erling Cruz, presented with chronic headaches, post traumatic migraines, and concussion.   CONCUSSION:  Had another fall , this time related to a attack by 2 dogs. 01-27-2021- ED visit number 2 on 12.31-2022. Right sided head pain and pressure, the side of impact. Hypersomnia since dog attack.  3. Tremor, subjective , involuntary movements are noted, jerking-  4  Migraine headache increased post trauma. She  also has had several days of high BP after the dog attack.  5. High stress level .    The patient does have a migraine today. This is day 6 of her migraine. If her headache does not resolve by tomorrow she will let us know and we will do an infusion. She is advised that if her symptoms worsen or she develops new symptoms she should let us know.  She will follow-up in 4-6 months with Np - follow up on possible post concussion syndrome or sooner if needed.  I spent 35-40 minutes with the patient. 50% of this time was spent discussing her new concern of concussion, and headache medication.  Larey Seat, MD   02/02/2021, 3:49 PM [facility] [address], [place], [place] [address] [phone]

## 2021-02-02 NOTE — Telephone Encounter (Signed)
[name] with [facility] called has some questions about the directions for the promethazine (PHENERGAN) 25 MG tablet, requesting a call back.

## 2021-02-02 NOTE — Patient Instructions (Addendum)
Concussion, Adult A concussion is a brain injury from a hard, direct hit (trauma) to your head or body. This direct hit causes your brain to quickly shake back and forth inside your skull. A concussion may also be called a mild traumatic brain injury (TBI). Healing from this injury can take time. What are the causes? This condition is caused by: A direct hit to your head, such as: Running into a player during a game. Being hit in a fight. Hitting your head on a hard surface. A quick and sudden movement of the head or neck, such as in a car crash. What are the signs or symptoms? The signs of a concussion can be hard to notice. They may be missed by you, family members, and doctors. You may look fine on the outside but may not act or feel normal. Physical symptoms Headaches. Being dizzy. Problems with body balance. Being sensitive to light or noise. Vomiting or feeling like you may vomit. Being tired. Problems seeing or hearing. Not sleeping or eating as you used to. Seizure. Mental and emotional symptoms Feeling grouchy (irritable). Having mood changes. Problems remembering things. Trouble focusing your mind (concentrating), organizing, or making decisions. Being slow to think, act, react, speak, or read. Feeling worried or nervous (anxious). Feeling sad (depressed). How is this treated? This condition may be treated by: Stopping sports or activity if you are injured. If you hit your head or have signs of concussion: Do not return to sports or activities the same day. Get checked by a doctor before you return to your activities. Resting your body and your mind. Being watched carefully, often at home. Medicines to help with symptoms such as: Headaches. Feeling like you may vomit. Problems with sleep. Avoiding alcohol and drugs. Being asked to go to a concussion clinic or a place to help you recover (rehabilitation center). Recovery from a concussion can take time. Return to  activities only: When you are fully healed. When your doctor says it is safe. Avoid taking strong pain medicines (opioids) for a concussion. Follow these instructions at home: Activity Limit activities that need a lot of thought or focus, such as: Homework or work for your job. Watching TV. Using the computer or phone. Playing memory games and puzzles. Rest. Rest helps your brain heal. Make sure you: Get plenty of sleep. Most adults should get 7-9 hours of sleep each night. Rest during the day. Take naps or breaks when you feel tired. Avoid activity like exercise until your doctor says its safe. Stop any activity that makes symptoms worse. Do not do activities that could cause a second concussion, such as riding a bike or playing sports. Ask your doctor when you can return to your normal activities, such as school, work, sports, and driving. Your ability to react may be slower. Do not do these activities if you are dizzy. General instructions  Take over-the-counter and prescription medicines only as told by your doctor. Do not drink alcohol until your doctor says you can. Watch your symptoms and tell other people to do the same. Other problems can occur after a concussion. Older adults have a higher risk of serious problems. Tell your work Freight forwarder, teachers, Government social research officer, school counselor, coach, or Product/process development scientist about your injury and symptoms. Tell them about what you can or cannot do. Keep all follow-up visits as told by your doctor. This is important. How is this prevented? It is very important that you do not get another brain injury. In rare  cases, another injury can cause brain damage that will not go away, brain swelling, or death. The risk of this is greatest in the first 7-10 days after a head injury. To avoid injuries: Stop activities that could lead to a second concussion, such as contact sports, until your doctor says it is okay. When you return to sports or activities: Do  not crash into other players. This is how most concussions happen. Follow the rules. Respect other players. Do not engage in violent behavior while playing. Get regular exercise. Do strength and balance training. Wear a helmet that fits you well during sports, biking, or other activities. Helmets can help protect you from serious skull and brain injuries, but they do not protect you from a concussion. Even when wearing a helmet, you should avoid being hit in the head. Contact a doctor if: Your symptoms do not get better. You have new symptoms. You have another injury. Get help right away if: You have bad headaches or your headaches get worse. You feel weak or numb in any part of your body. You feel mixed up (confused). Your balance gets worse. You vomit often. You feel more sleepy than normal. You cannot speak well, or have slurred speech. You have a seizure. Others have trouble waking you up. You have changes in how you act. You have changes in how you see (vision). You pass out (lose consciousness). These symptoms may be an emergency. Do not wait to see if the symptoms will go away. Get medical help right away. Call your local emergency services (911 in the [place] Do not drive yourself to the hospital. Summary A concussion is a brain injury from a hard, direct hit (trauma) to your head or body. This condition is treated with rest and careful watching of symptoms. Ask your doctor when you can return to your normal activities, such as school, work, or driving. Get help right away if you have a very bad headache, feel weak in any part of your body, have a seizure, have changes in how you act or see, or if you are mixed up or more sleepy than normal. This information is not intended to replace advice given to you by your health care provider. Make sure you discuss any questions you have with your health care provider.   PATIENT: [name] DOB: 10/10/1970   REASON FOR VISIT:  follow up HISTORY FROM: patient and mother , and referral notes and papers from [name], MD    [place]:      Today 02/02/21:   02-02-2021: RV after a head injury.  CONCUSSION DX IN ED_ 01-29-2021.  [name]. [name] is a well-established patient in our neurologic practice, meanwhile 51 years of age, Caucasian right-handed She states that she had a concussion.  She is usually followed here for migraine without aura but with status migrainosus and has multiple times received IV treatments to intervene. She was attacked  by her neighbors dogs in her own back yard, police an EMS were called, she was not bitten had her mother drive her to [facility]  ED-  was dismissed- and she went to [facility] ED- she did get a CT of neck and head. Had massive headaches, got a migraine cocktail. This took the HA from a 10 to level 3. She reporters she had a difficult year, plantar fasciitis, falls, work related problems, abnormal EKG.- sh was dizzy an fell of the cardiologist table.  ED performed CT head and neck  reviewed today with patient- normal, non contrast studies, no labs were drawn.        MM- 06-26-2017 Ms. Azua is a 51 year old female with a history of migraine headaches.  She returns today for follow-up.  She reports that she typically has one headache a week.  She reports often her headaches can last up to 3 days.  She does have sumatriptan injections but does not use it regularly.  She continues on Depakote and Lamictal as prescribed by her psychiatrist.  She states that her depression is under relatively good control.  Reports that she did have a bad week last week and had to see her therapist 3 times.  She reports that her mom was recently diagnosed with breast cancer and will be undergoing radiation.  Her dad also recently passed away.  She reports that she had a couple of falls.  She is in physical therapy for her left shoulder and right hip.  She reports on occasion she will have  dizzy episodes.  She also has episodes of nausea that occur randomly with no other symptoms.  She does use over-the-counter medication such as Advil to treat her headaches.  She returns today for evaluation.       REVIEW OF SYSTEMS: Out of a complete 14 system review of symptoms, the patient complains only of the following symptoms, and all other reviewed systems were not endorsed:   Ms. Brawley reports that she easily spilled strengths or has trouble holding any objects with her hand, her fine motor skills have been impaired, she reports twitching involuntary movements, tremor, she also feels lightheaded and unsteady in her gait, she had abnormal eye movements on exam.  The extreme dizziness is associated with feeling cognitively slowed as and in a fog.   She is afraid of falling. She falls a lot, gets dizzy.   She further endorsed difficulty swallowing, confusion headaches easy bruising increased thirst, eye pain joint pain allergies disinterest in activities, change in appetite, the feeling of needing to much sleep, depression and anxiety, hypertension.     ALLERGIES:      Allergies  Allergen Reactions   Adhesive [Tape] Itching and Rash      Also reacted to Steri Strips and Band-Aids.   Amoxicillin Hives   Dilaudid [Hydromorphone Hcl] Itching   Morphine Nausea And Vomiting   Penicillins Hives      Has patient had a PCN reaction causing immediate rash, facial/tongue/throat swelling, SOB or lightheadedness with hypotension: YES Has patient had a PCN reaction causing severe rash involving mucus membranes or skin necrosis: NO Has patient had a PCN reaction that required hospitalization NO Has patient had a PCN reaction occurring within the last 10 years:NO If all of the above answers are "NO", then may proceed with Cephalosporin use.   Percocet [Oxycodone-Acetaminophen] Itching   Prednisone Hives   Provera [Medroxyprogesterone Acetate] Other (See Comments)      Causes manic episodes    Ultram [Tramadol Hcl] Itching      HOME MEDICATIONS:       Outpatient Medications Prior to Visit  Medication Sig Dispense Refill   ALFALFA PO Take 500 mg by mouth daily.       amphetamine-dextroamphetamine (ADDERALL XR) 20 MG 24 hr capsule 1 capsule in the morning       Ascorbic Acid (VITAMIN C) 1000 MG tablet Take 1,000 mg by mouth daily.       calcium carbonate (OSCAL) 1500 (600 Ca) MG TABS tablet in the morning  and at bedtime.       cetirizine (ZYRTEC) 10 MG chewable tablet Chew 10 mg by mouth daily.       Cholecalciferol (VITAMIN D3 PO) Take 50 mcg by mouth daily.       Cyanocobalamin (B-12 PO) Take 5,000 mcg by mouth daily.       divalproex (DEPAKOTE ER) 500 MG 24 hr tablet TAKE 2 TABLETS BY MOUTH AT BEDTIME FOR MOOD 60 tablet 5   Galcanezumab-gnlm (EMGALITY) 120 MG/ML SOSY Inject 120 mg into the skin every 30 (thirty) days. 1.12 mL 11   hydrOXYzine (VISTARIL) 50 MG capsule Take 50 mg by mouth in the morning and at bedtime.    2   lamoTRIgine (LAMICTAL) 150 MG tablet Take 300 mg by mouth at bedtime.   1   LATUDA 20 MG TABS tablet Take 20 mg by mouth every morning.       lurasidone (LATUDA) 80 MG TABS tablet Take 1 tablet (80 mg total) by mouth daily with supper. For mood control 30 tablet 0   metoprolol succinate (TOPROL-XL) 100 MG 24 hr tablet Take 1 tablet (100 mg total) by mouth daily. Take with or immediately following a meal. 30 tablet 0   Multiple Vitamin (MULTIVITAMIN WITH MINERALS) TABS tablet Take 1 tablet by mouth daily.       mupirocin ointment (BACTROBAN) 2 % Apply topically.       Omega-3 Fatty Acids (OMEGA 3 PO) Take by mouth.       promethazine (PHENERGAN) 25 MG tablet Take 1 tablet (25 mg total) by mouth every 8 (eight) hours as needed for nausea or vomiting. TK 1 T PO BID FOR 15 DAYS PRN 30 tablet 0   SUMAtriptan 6 MG/0.5ML SOAJ Inject 0.60m onset of migraine and may repeat in 2 hours as needed (max 164m/ 24 hours). 16 mL 0   traZODone (DESYREL) 100 MG tablet Take 100  mg by mouth at bedtime.       Zinc 50 MG TABS 1 tablet       loratadine (CLARITIN) 10 MG tablet Take 10 mg by mouth daily.                 Facility-Administered Medications Prior to Visit  Medication Dose Route Frequency Provider Last Rate Last Admin   methylPREDNISolone acetate (DEPO-MEDROL) injection 40 mg  40 mg Intra-articular Once Hudnall, ShSharyn LullMD          PAST MEDICAL HISTORY:     Past Medical History:  Diagnosis Date   Achilles tendinitis     ADHD (attention deficit hyperactivity disorder), inattentive type      Diagnosed as an adult after starting college   Allergy     Arthritis     Bipolar I disorder 04/18/2014    most recent episode mixed   Borderline personality disorder 10/27/2011   Cataracts, bilateral     Chronic pain disorder      due to several injuries affecting numerous areas of her body throughout the years   Chronic paroxysmal hemicrania, 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    rt chest wall nodule removal   GANGLION CYST EXCISION   2011   lipoma removal       right bunioectomy       SHOULDER SURGERY   01/13/2011    right, partial tear    tumor resection left thigh          FAMILY HISTORY:      Family History  Problem Relation Age of Onset   Hypertension Mother     Hyperlipidemia Mother     Breast cancer Mother 62        genetic testing- CHEK2 likely pathogenic variant   Endometrial cancer Mother     Heart attack Father     Heart disease Father     Hypertension Father     Bipolar disorder Father     Heart disease Maternal Aunt     Breast cancer Maternal Aunt 65   Breast cancer Maternal Aunt 65   Breast cancer Maternal Aunt 35   Breast cancer Maternal Aunt     Colon cancer Maternal Uncle 75   Heart disease Maternal Grandmother     Colon cancer Maternal Grandmother 66   Lymphoma Maternal Grandfather 84   Diabetes Paternal Grandfather     Bone cancer Cousin 45   Cervical cancer Cousin          Genetic testing- 'was positive for a gene' had a mastectomy      SOCIAL HISTORY: Social History         Socioeconomic History   Marital status: Single      Spouse name: Not on file   Number of children: 0   Years of education: 16   Highest education level: Bachelor's degree (e.g., BA, AB, BS)  Occupational History      Employer: [employer]  Tobacco Use   Smoking status: Never   Smokeless tobacco: Never  Vaping Use   Vaping Use: Never used  Substance and Sexual Activity   Alcohol use: No      Alcohol/week: 0.0 standard drinks   Drug use: No   Sexual activity: Never      Birth control/protection: None  Other Topics Concern   Not on file  Social History Narrative    Caffeine  2 sodas daily, 1 cup coffee daily.    Social Determinants of Health    Financial Resource Strain: Not on file  Food Insecurity: Not on file  Transportation Needs: Not on file  Physical Activity: Not on file  Stress: Not on file  Social Connections: Not on file  Intimate Partner Violence: Not on file          PHYSICAL EXAM      Vitals:    02/02/21 1527  BP: 123/75  Pulse: 69  Weight: 273 lb 8 oz (124.1 kg)  Height: _0   (1.753 m)    Body mass  index is 40.39 kg/m.   Generalized: Well developed, in no acute distress    Neurological examination  Mentation: Alert oriented to time, place, history taking. Follows all commands speech and language fluent Cranial nerve:  Pupils were equally round and reactive to light. She presents again with ptosis on the right side. She has had lens replacement.  Extraocular movements were full, but there is nystagmus, corrective horizontal nystagmus to the left, not as much to the right. Her visual field were full on confrontational test, but she reports blurring.  Facial sensation and strength were normal, except for ptosis - photophobia induced squinting. Marland Kitchen  Uvula and tongue are in midline.  Head turning and shoulder shrug were normal and symmetric.   Motor: full strength of all 4 extremities with symmetric motor tone noted throughout.  Sensory: intact to soft touch on all 4 extremities. No evidence of extinction is noted.  Coordination:  finger-nose- testing - slowed, and with ataxia , dysmetria rather than tremor- deliberate or true organic(?) she present with jerking movements.  Satellitic.  Gait and station: Gait is unsteady.Tandem gait was deferred. Romberg, too . No drift is seen.  Reflexes: Deep tendon reflexes are symmetric but patella reflexes are attenuated.        DIAGNOSTIC DATA (LABS, IMAGING, TESTING) - I reviewed patient records, labs, notes, testing and imaging myself where available.   Recent Labs       Lab Results  Component Value Date    WBC 7.2 07/19/2020    HGB 14.2 07/19/2020    HCT 43.1 07/19/2020    MCV 90 07/19/2020    PLT 246 07/19/2020      Labs (Brief)          Component Value Date/Time    NA 140 07/19/2020 1004    K 4.5 07/19/2020 1004    CL 101 07/19/2020 1004    CO2 25 07/19/2020 1004    GLUCOSE 78 07/19/2020 1004    GLUCOSE 92 05/08/2019 1200    BUN 14 07/19/2020 1004    CREATININE 0.93 07/19/2020 1004    CREATININE 0.83  09/11/2011 1524    CALCIUM 9.7 07/19/2020 1004    PROT 6.1 07/19/2020 1004    ALBUMIN 4.3 07/19/2020 1004    AST 18 07/19/2020 1004    ALT 13 07/19/2020 1004    ALKPHOS 93 07/19/2020 1004    BILITOT 0.3 07/19/2020 1004    GFRNONAA 60 01/15/2020 0948    GFRAA 69 01/15/2020 0948      Recent Labs       Lab Results  Component Value Date    CHOL 198 12/29/2016    HDL 53 12/29/2016    LDLCALC 118 (H) 12/29/2016    TRIG 136 12/29/2016    CHOLHDL 3.7 12/29/2016      Recent Labs       Lab Results  Component Value Date    HGBA1C 5.1 12/29/2016      Recent Labs       Lab Results  Component Value Date    VITAMINB12 >2000 (H) 07/19/2020      Recent Labs       Lab Results  Component Value Date    TSH 4.288 12/29/2016      Headache      ED report: Annisha Baar Buchler is a 51 y.o. female who presents for reevaluation after fall . Patient was feeling threatened by 2 dogs that were loose in her yard.  She fell backward over a  flowerpot striking her head, right shoulder and right hip.  She had a right shoulder and right hip x-ray that were negative but complains of pain in her right lower back radiating down the back of her leg.  She denies saddle anesthesia, bowel or bladder incontinence, weakness in the leg.  Patient also has a history of migraine headaches, previous severe concussions and established relationship at [facility].  She has been having a 9 out of 10 progressively worsening headache which is global, pressure-like.  She has associated increased sleepiness, difficulty concentrating, "brain fog."  She is also felt a bit off balance and had some intermittent blurry vision.   Her neurologist recommended a CT scan of the head and neck.     ASSESSMENT AND PLAN 51 y.o. year old female patient , long established , formerly with Dr. Erling Cruz, presented with chronic headaches, post traumatic migraines, and concussion.    CONCUSSION:  Had another fall , this time  related to a attack by 2 dogs. 01-27-2021- ED visit number 2 on 12.31-2022. Right sided head pain and pressure, the side of impact. Hypersomnia since dog attack.  3. Tremor, subjective , involuntary movements are noted, jerking-  4  Migraine headache increased post trauma. She also has had several days of high BP after the dog attack.  5. High stress level .     The patient does have a migraine today. This is day 6 of her migraine. If her headache does not resolve by tomorrow she will let us know and we will do an infusion. She is advised that if her symptoms worsen or she develops new symptoms she should let us know.  She will follow-up in 4-6 months with Np - follow up on possible post concussion syndrome or sooner if needed.   I spent 35-40 minutes with the patient. 50% of this time was spent discussing her new concern of concussion, and headache medication.   Larey Seat, MD    02/02/2021, 3:49 PM [facility] [address], [place], [place] [address] [phone]   Document Revised: 04/01/2020 Document Reviewed: 04/01/2020 Elsevier Patient Education  [place].

## 2021-02-02 NOTE — Telephone Encounter (Signed)
Spoke with Dr. Brett Fairy to clarify directions for promethazine. Directions should be 1 po BID prn. I e-scribed updated script. I call [facility]/spoke w. Deardea and provided update as well. She confirmed she received new script.

## 2021-02-03 ENCOUNTER — Telehealth: Payer: Self-pay | Admitting: Neurology

## 2021-02-03 DIAGNOSIS — G43011 Migraine without aura, intractable, with status migrainosus: Secondary | ICD-10-CM | POA: Diagnosis not present

## 2021-02-03 NOTE — Telephone Encounter (Signed)
Pt called needing an infusion order put in for her so that she can be scheduled. Please advise.

## 2021-02-03 NOTE — Telephone Encounter (Signed)
Spoke w/ Dr. Brett Fairy who saw pt yesterday and offered infusion if no improvement in headache. She would like her to come in for 500g Depacon IVP and 125mg  solumedrol IVP. Gave completed/signed orders to intrafusion. Called pt and she will head to office now. She will be here in about 30 min.

## 2021-02-07 ENCOUNTER — Ambulatory Visit (INDEPENDENT_AMBULATORY_CARE_PROVIDER_SITE_OTHER): Payer: PPO | Admitting: Family Medicine

## 2021-02-07 VITALS — BP 126/84 | Ht 69.5 in | Wt 273.0 lb

## 2021-02-07 DIAGNOSIS — S060X0A Concussion without loss of consciousness, initial encounter: Secondary | ICD-10-CM

## 2021-02-07 DIAGNOSIS — M5416 Radiculopathy, lumbar region: Secondary | ICD-10-CM | POA: Diagnosis not present

## 2021-02-07 MED ORDER — PREDNISONE 5 MG PO TABS: ORAL_TABLET | ORAL | 0 refills | Status: DC

## 2021-02-07 NOTE — Progress Notes (Signed)
[name] - 51 y.o. female MRN [id number]  Date of birth: [birth year]  SUBJECTIVE:  Including CC & ROS.  No chief complaint on file.   [name] is a 51 y.o. female that is presenting with concussion and right leg pain.  Her symptoms have been ongoing since her fall on 12/29.  The pain in her right leg emanates from the lateral hip down to the foot.  She is also having exacerbation of underlying migraines with trouble sleeping ataxia and dizziness.  Has been seen by her neurologist.  Has a history of 3 concussions.  Independent review of the CT head from 12/31 shows no changes.  Independent review of the CT cervical spine from 12/31 shows a normal exam.  Reviewing the office visit note from 1/4 shows she was diagnosed with concussion that is complicated with a migraine.  Review of the emergency department visit from 12/31 shows she was provided a migraine cocktail with CT of her head and neck performed.  Review of the emergency department note from 10/29 shows that she had a fall and recommended OTC NSAIDS.   Review of Systems See HPI   HISTORY: Past Medical, Surgical, Social, and Family History Reviewed & Updated per EMR.   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   rt chest wall nodule removal   GANGLION CYST EXCISION  2011   lipoma removal     right bunioectomy     SHOULDER SURGERY  01/13/2011   right, partial tear   tumor resection left thigh       PHYSICAL EXAM:  VS: BP 126/84    Ht 5' 9.5" (1.765 m)    Wt 273 lb (123.8 kg)    BMI 39.74 kg/m  Physical Exam Gen: NAD, alert, cooperative with exam, well-appearing MSK:  Neurovascularly intact       ASSESSMENT & PLAN:   Concussion with no loss of consciousness Complicated with her history of migraines.  Migraine is improving but still has ongoing lingering effects.  Initial injury on 12/29 -Counseled on home exercise therapy  and supportive care. -Referral to physical therapy.  Lumbar radiculopathy Had a fall to her right side and now having pain occurs from her lateral hip down to her foot. -Counseled on home exercise therapy and supportive care. -Prednisone. -Could consider further imaging.

## 2021-02-07 NOTE — Patient Instructions (Signed)
Good to see you  Please try heat  Please try the exercises  Please try physical therapy  Please send me a message in MyChart with any questions or updates.  Please see me back in 2 weeks.   --Dr. Raeford Razor

## 2021-02-07 NOTE — Assessment & Plan Note (Signed)
Had a fall to her right side and now having pain occurs from her lateral hip down to her foot. -Counseled on home exercise therapy and supportive care. -Prednisone. -Could consider further imaging.

## 2021-02-07 NOTE — Assessment & Plan Note (Signed)
Complicated with her history of migraines.  Migraine is improving but still has ongoing lingering effects.  Initial injury on 12/29 -Counseled on home exercise therapy and supportive care. -Referral to physical therapy.

## 2021-02-14 ENCOUNTER — Other Ambulatory Visit: Payer: Self-pay | Admitting: Adult Health

## 2021-02-15 ENCOUNTER — Encounter (INDEPENDENT_AMBULATORY_CARE_PROVIDER_SITE_OTHER): Payer: Self-pay | Admitting: Family Medicine

## 2021-02-15 ENCOUNTER — Other Ambulatory Visit: Payer: Self-pay | Admitting: Adult Health

## 2021-02-15 ENCOUNTER — Other Ambulatory Visit: Payer: Self-pay

## 2021-02-15 ENCOUNTER — Ambulatory Visit (INDEPENDENT_AMBULATORY_CARE_PROVIDER_SITE_OTHER): Payer: PPO | Admitting: Family Medicine

## 2021-02-15 VITALS — BP 116/68 | HR 68 | Temp 97.9°F | Ht 69.0 in | Wt 270.0 lb

## 2021-02-15 DIAGNOSIS — R42 Dizziness and giddiness: Secondary | ICD-10-CM

## 2021-02-15 DIAGNOSIS — E7849 Other hyperlipidemia: Secondary | ICD-10-CM | POA: Diagnosis not present

## 2021-02-15 DIAGNOSIS — R0602 Shortness of breath: Secondary | ICD-10-CM | POA: Diagnosis not present

## 2021-02-15 DIAGNOSIS — F3160 Bipolar disorder, current episode mixed, unspecified: Secondary | ICD-10-CM | POA: Diagnosis not present

## 2021-02-15 DIAGNOSIS — R5383 Other fatigue: Secondary | ICD-10-CM | POA: Diagnosis not present

## 2021-02-15 DIAGNOSIS — Z6841 Body Mass Index (BMI) 40.0 and over, adult: Secondary | ICD-10-CM | POA: Diagnosis not present

## 2021-02-15 DIAGNOSIS — E538 Deficiency of other specified B group vitamins: Secondary | ICD-10-CM | POA: Diagnosis not present

## 2021-02-15 DIAGNOSIS — Z1331 Encounter for screening for depression: Secondary | ICD-10-CM

## 2021-02-15 DIAGNOSIS — I10 Essential (primary) hypertension: Secondary | ICD-10-CM

## 2021-02-15 NOTE — Progress Notes (Signed)
Dear Dr. Radene Ou,   Thank you for referring [name] to our clinic. The following note includes my evaluation and treatment recommendations.  Chief Complaint:   [name] (MR# [id number]) is a 51 y.o. female who presents for evaluation and treatment of Joyce and related comorbidities. Current BMI is Body mass index is 39.87 kg/m. [name] has been struggling with her weight for many years and has been unsuccessful in either losing weight, maintaining weight loss, or reaching her healthy weight goal.  [name] is currently in the action stage of change and ready to dedicate time achieving and maintaining a healthier weight. [name] is interested in becoming our patient and working on intensive lifestyle modifications including (but not limited to) diet and exercise for weight loss.  Pt is on disability for Bipolar. She lives with her mom, who does the cooking. Pt's mom is 68 year old and over weight as well. She eats out 5 days a week. Pt craves carbs (pizza, pasta, french fries, and ice cream).  [name]'s habits were reviewed today and are as follows: Her family eats meals together, she thinks her family will eat healthier with her, her desired weight loss is 70 lbs, she started gaining weight when she was diagnosed with mental illness in 2000, her heaviest weight ever was 308 pounds, she is a picky eater and doesn't like to eat healthier foods, she has significant food cravings issues, she snacks frequently in the evenings, she wakes up frequently in the middle of the night to eat, she is frequently drinking liquids with calories, she frequently makes poor food choices, she has problems with excessive hunger, she frequently eats larger portions than normal, and she struggles with emotional eating.  Depression Screen [name]'s Food and Mood (modified PHQ-9) score was 11.  Depression screen PHQ 2/9 02/15/2021  Decreased Interest 2  Down, Depressed, Hopeless 2  PHQ - 2 Score  4  Altered sleeping 1  Tired, decreased energy 2  Change in appetite 1  Feeling bad or failure about yourself  2  Trouble concentrating 1  Moving slowly or fidgety/restless 0  Suicidal thoughts 0  PHQ-9 Score 11  Difficult doing work/chores Not difficult at all  Some recent data might be hidden   Subjective:   1. Other fatigue [name] admits to daytime somnolence and admits to waking up still tired. Patent has a history of symptoms of daytime fatigue, morning fatigue, and morning headache. [name] generally gets  8-10  hours of sleep per night, and states that she has generally restful sleep. Snoring is present. Apneic episodes are not present. Epworth Sleepiness Score is 3.  2. Shortness of breath on exertion [name] notes increasing shortness of breath with exercising and seems to be worsening over time with weight gain. She notes getting out of breath sooner with activity than she used to. This has gotten worse recently. [name] denies shortness of breath at rest or orthopnea.  3. Hypertension, unspecified type Diagnosed around age 2. Medication: Toprol XL  4. B12 deficiency Pt takes OTC B12 5,000 mcg QD, per pt, directed by neurology.  5. Other hyperlipidemia Pt has had elevated LDL of 118 in the past, HDL 53, and triglycerides 136 (last checked 2018, per EMR). Medication: None  6. Bipolar disorder, mixed ([place]) with emotional eating Pt sees psychiatry provider for care. Her symptoms are controlled most of the time. She has a therapist that she sees weekly, and has since 2000.  7. Chronic Dizziness Pt  sees neurology- [name] for "brain fog" and chronic memory issues, as well as chronic dizziness. Pt recently had mild concussion- followed by Sports Med and Neuro currently.   Assessment/Plan:   Orders Placed This Encounter  Procedures   Hemoglobin A1c   Folate   Comprehensive metabolic panel   CBC with Differential/Platelet   Vitamin B12   Insulin, random   Lipid Panel  With LDL/HDL Ratio   T3   T4, free   TSH   VITAMIN D 25 Hydroxy (Vit-D Deficiency, Fractures)    Medications Discontinued During This Encounter  Medication Reason   Cholecalciferol (VITAMIN D3 PO)      No orders of the defined types were placed in this encounter.    1. Other fatigue [name] does feel that her weight is causing her energy to be lower than it should be. Fatigue may be related to Joyce, depression or many other causes. Labs will be ordered, and in the meanwhile, [name] will focus on self care including making healthy food choices, increasing physical activity and focusing on stress reduction. Check labs today.  - Hemoglobin A1c - Folate - CBC with Differential/Platelet - Vitamin B12 - Insulin, random - T3 - T4, free - TSH - VITAMIN D 25 Hydroxy (Vit-D Deficiency, Fractures)  2. Shortness of breath on exertion [name] does feel that she gets out of breath more easily that she used to when she exercises. [name]'s shortness of breath appears to be Joyce related and exercise induced. She has agreed to work on weight loss and gradually increase exercise to treat her exercise induced shortness of breath. Will continue to monitor closely.  3. Hypertension, unspecified type At goal today. [name] is working on healthy weight loss and exercise to improve blood pressure control. We will watch for signs of hypotension as she continues her lifestyle modifications. Check labs today.  - Comprehensive metabolic panel - Lipid Panel With LDL/HDL Ratio - T3 - T4, free - TSH  4. B12 deficiency Continue current treatment plan, per neuro.specialist. The diagnosis was reviewed with the patient. Counseling provided today, see below. We will continue to monitor. Orders and follow up as documented in patient record. Check labs today.  Counseling The body needs vitamin B12: to make red blood cells; to make DNA; and to help the nerves work properly so they can carry messages from the  brain to the body.  The main causes of vitamin B12 deficiency include dietary deficiency, digestive diseases, pernicious anemia, and having a surgery in which part of the stomach or small intestine is removed.  Certain medicines can make it harder for the body to absorb vitamin B12. These medicines include: heartburn medications; some antibiotics; some medications used to treat diabetes, gout, and high cholesterol.  In some cases, there are no symptoms of this condition. If the condition leads to anemia or nerve damage, various symptoms can occur, such as weakness or fatigue, shortness of breath, and numbness or tingling in your hands and feet.   Treatment:  May include taking vitamin B12 supplements.  Avoid alcohol.  Eat lots of healthy foods that contain vitamin B12: Beef, pork, chicken, [place], and organ meats, such as liver.  Seafood: This includes clams, rainbow trout, salmon, tuna, and haddock. Eggs.  Cereal and dairy products that are fortified: This means that vitamin B12 has been added to the food.   5. Other hyperlipidemia Cardiovascular risk and specific lipid/LDL goals reviewed.  We discussed several lifestyle modifications today and Tymeshia will continue to  work on diet, exercise and weight loss efforts. Orders and follow up as documented in patient record. Check labs today.  Counseling Intensive lifestyle modifications are the first line treatment for this issue. Dietary changes: Increase soluble fiber. Decrease simple carbohydrates. Exercise changes: Moderate to vigorous-intensity aerobic activity 150 minutes per week if tolerated. Lipid-lowering medications: see documented in medical record.  6. Bipolar disorder, mixed ([place]) with emotional eating Multiple meds, per psychiatrist. Pt's mood appears stable. Continue with therapist weekly and pt encouraged to discuss emotional eating habits.  7. Chronic Dizziness Continue current treatment plan, per neurology and Sports Medicine.  Pt will be starting PT for balance soon.  8. Screening for depression Suesan had a positive depression screening. Depression is commonly associated with Joyce and often results in emotional eating behaviors. We will monitor this closely and work on CBT to help improve the non-hunger eating patterns. Referral to Psychology may be required if no improvement is seen as she continues in our clinic.  9. Joyce with current BMI of 40  Savon is currently in the action stage of change and her goal is to continue with weight loss efforts. I recommend Korri begin the structured treatment plan as follows:  She has agreed to the Category 1 Plan.  Exercise goals:  Pt is starting PT soon.    Behavioral modification strategies: decreasing eating out, meal planning and cooking strategies, and planning for success.  She was informed of the importance of frequent follow-up visits to maximize her success with intensive lifestyle modifications for her multiple health conditions. She was informed we would discuss her lab results at her next visit unless there is a critical issue that needs to be addressed sooner. Glora agreed to keep her next visit at the agreed upon time to discuss these results.  Objective:   Blood pressure 116/68, pulse 68, temperature 97.9 F (36.6 C), height 5\' 9"  (1.753 m), weight 270 lb (122.5 kg), SpO2 97 %. Body mass index is 39.87 kg/m.  EKG: Normal sinus rhythm, rate 65 (10/19/2020).  Indirect Calorimeter completed today shows a VO2 of 244 and a REE of 1685.  Her calculated basal metabolic rate is 1638 thus her basal metabolic rate is worse than expected.  General: Cooperative, alert, well developed, in no acute distress. HEENT: Conjunctivae and lids unremarkable. Cardiovascular: Regular rhythm.  Lungs: Normal work of breathing. Neurologic: No focal deficits.   Lab Results  Component Value Date   CREATININE 0.93 07/19/2020   BUN 14 07/19/2020   NA 140 07/19/2020   K  4.5 07/19/2020   CL 101 07/19/2020   CO2 25 07/19/2020   Lab Results  Component Value Date   ALT 13 07/19/2020   AST 18 07/19/2020   ALKPHOS 93 07/19/2020   BILITOT 0.3 07/19/2020   Lab Results  Component Value Date   HGBA1C 5.1 12/29/2016   HGBA1C 5.1 07/19/2016   HGBA1C 5.4 09/25/2014   HGBA1C 5.3 04/14/2014   HGBA1C 5.3 07/11/2007   No results found for: INSULIN Lab Results  Component Value Date   TSH 4.288 12/29/2016   Lab Results  Component Value Date   CHOL 198 12/29/2016   HDL 53 12/29/2016   LDLCALC 118 (H) 12/29/2016   TRIG 136 12/29/2016   CHOLHDL 3.7 12/29/2016   Lab Results  Component Value Date   WBC 7.2 07/19/2020   HGB 14.2 07/19/2020   HCT 43.1 07/19/2020   MCV 90 07/19/2020   PLT 246 07/19/2020   Lab Results  Component  Value Date   IRON 111 07/19/2020   TIBC 286 07/19/2020   FERRITIN 63 07/19/2020   Joyce Behavioral Intervention:   Approximately 15 minutes were spent on the discussion below.  ASK: We discussed the diagnosis of Joyce with [name] today and Pallavi agreed to give [place] permission to discuss Joyce behavioral modification therapy today.  ASSESS: Alura has the diagnosis of Joyce and her BMI today is 40.0. Isabelle is in the action stage of change.   ADVISE: Felipe was educated on the multiple health risks of Joyce as well as the benefit of weight loss to improve her health. She was advised of the need for long term treatment and the importance of lifestyle modifications to improve her current health and to decrease her risk of future health problems.  AGREE: Multiple dietary modification options and treatment options were discussed and Anatalia agreed to follow the recommendations documented in the above note.  ARRANGE: Anavi was educated on the importance of frequent visits to treat Joyce as outlined per CMS and USPSTF guidelines and agreed to schedule her next follow up appointment today.  Attestation Statements:    Reviewed by clinician on day of visit: allergies, medications, problem list, medical history, surgical history, family history, social history, and previous encounter notes.  Coral Ceo, CMA, am acting as transcriptionist for [employer], DO.  I have reviewed the above documentation for accuracy and completeness, and I agree with the above. Marjory Sneddon, D.O.  The [place] was signed into law in 2016 which includes the topic of electronic health records.  This provides immediate access to information in MyChart.  This includes consultation notes, operative notes, office notes, lab results and pathology reports.  If you have any questions about what you read please let us know at your next visit so we can discuss your concerns and take corrective action if need be.  We are right here with you.

## 2021-02-16 LAB — CBC WITH DIFFERENTIAL/PLATELET
Basophils Absolute: 0.1 10*3/uL (ref 0.0–0.2)
Basos: 1 %
EOS (ABSOLUTE): 0.1 10*3/uL (ref 0.0–0.4)
Eos: 1 %
Hematocrit: 43.8 % (ref 34.0–46.6)
Hemoglobin: 14.7 g/dL (ref 11.1–15.9)
Immature Grans (Abs): 0 10*3/uL (ref 0.0–0.1)
Immature Granulocytes: 0 %
Lymphocytes Absolute: 3.9 10*3/uL — ABNORMAL HIGH (ref 0.7–3.1)
Lymphs: 46 %
MCH: 30.6 pg (ref 26.6–33.0)
MCHC: 33.6 g/dL (ref 31.5–35.7)
MCV: 91 fL (ref 79–97)
Monocytes Absolute: 0.9 10*3/uL (ref 0.1–0.9)
Monocytes: 10 %
Neutrophils Absolute: 3.6 10*3/uL (ref 1.4–7.0)
Neutrophils: 42 %
Platelets: 260 10*3/uL (ref 150–450)
RBC: 4.81 x10E6/uL (ref 3.77–5.28)
RDW: 12.1 % (ref 11.7–15.4)
WBC: 8.6 10*3/uL (ref 3.4–10.8)

## 2021-02-16 LAB — COMPREHENSIVE METABOLIC PANEL
ALT: 14 IU/L (ref 0–32)
AST: 15 IU/L (ref 0–40)
Albumin/Globulin Ratio: 2.1 (ref 1.2–2.2)
Albumin: 4.1 g/dL (ref 3.8–4.9)
Alkaline Phosphatase: 71 IU/L (ref 44–121)
BUN/Creatinine Ratio: 9 (ref 9–23)
BUN: 8 mg/dL (ref 6–24)
Bilirubin Total: 0.5 mg/dL (ref 0.0–1.2)
CO2: 25 mmol/L (ref 20–29)
Calcium: 9.8 mg/dL (ref 8.7–10.2)
Chloride: 99 mmol/L (ref 96–106)
Creatinine, Ser: 0.88 mg/dL (ref 0.57–1.00)
Globulin, Total: 2 g/dL (ref 1.5–4.5)
Glucose: 82 mg/dL (ref 70–99)
Potassium: 4 mmol/L (ref 3.5–5.2)
Sodium: 138 mmol/L (ref 134–144)
Total Protein: 6.1 g/dL (ref 6.0–8.5)
eGFR: 80 mL/min/{1.73_m2} (ref >59)

## 2021-02-16 LAB — TSH: TSH: 2.66 u[IU]/mL (ref 0.450–4.500)

## 2021-02-16 LAB — HEMOGLOBIN A1C
Est. average glucose Bld gHb Est-mCnc: 100 mg/dL
Hgb A1c MFr Bld: 5.1 % (ref 4.8–5.6)

## 2021-02-16 LAB — FOLATE: Folate: >20 ng/mL (ref >3.0)

## 2021-02-16 LAB — LIPID PANEL WITH LDL/HDL RATIO
Cholesterol, Total: 220 mg/dL — ABNORMAL HIGH (ref 100–199)
HDL: 57 mg/dL (ref >39)
LDL Chol Calc (NIH): 136 mg/dL — ABNORMAL HIGH (ref 0–99)
LDL/HDL Ratio: 2.4 ratio (ref 0.0–3.2)
Triglycerides: 151 mg/dL — ABNORMAL HIGH (ref 0–149)
VLDL Cholesterol Cal: 27 mg/dL (ref 5–40)

## 2021-02-16 LAB — VITAMIN D 25 HYDROXY (VIT D DEFICIENCY, FRACTURES): Vit D, 25-Hydroxy: 49.4 ng/mL (ref 30.0–100.0)

## 2021-02-16 LAB — VITAMIN B12: Vitamin B-12: >2000 pg/mL — ABNORMAL HIGH (ref 232–1245)

## 2021-02-16 LAB — T3: T3, Total: 150 ng/dL (ref 71–180)

## 2021-02-16 LAB — T4, FREE: Free T4: 1.59 ng/dL (ref 0.82–1.77)

## 2021-02-16 LAB — INSULIN, RANDOM: INSULIN: 9.3 u[IU]/mL (ref 2.6–24.9)

## 2021-02-19 ENCOUNTER — Encounter: Payer: Self-pay | Admitting: Family Medicine

## 2021-02-21 ENCOUNTER — Ambulatory Visit: Payer: PPO | Admitting: Family Medicine

## 2021-02-22 ENCOUNTER — Encounter (INDEPENDENT_AMBULATORY_CARE_PROVIDER_SITE_OTHER): Payer: Self-pay | Admitting: Family Medicine

## 2021-02-22 DIAGNOSIS — F3130 Bipolar disorder, current episode depressed, mild or moderate severity, unspecified: Secondary | ICD-10-CM | POA: Diagnosis not present

## 2021-02-22 DIAGNOSIS — I10 Essential (primary) hypertension: Secondary | ICD-10-CM | POA: Diagnosis not present

## 2021-02-22 DIAGNOSIS — G8929 Other chronic pain: Secondary | ICD-10-CM | POA: Diagnosis not present

## 2021-02-22 DIAGNOSIS — G43001 Migraine without aura, not intractable, with status migrainosus: Secondary | ICD-10-CM | POA: Diagnosis not present

## 2021-02-23 ENCOUNTER — Encounter: Payer: Self-pay | Admitting: Neurology

## 2021-02-25 DIAGNOSIS — R110 Nausea: Secondary | ICD-10-CM | POA: Diagnosis not present

## 2021-03-01 ENCOUNTER — Ambulatory Visit (INDEPENDENT_AMBULATORY_CARE_PROVIDER_SITE_OTHER): Payer: PPO | Admitting: Family Medicine

## 2021-03-01 ENCOUNTER — Other Ambulatory Visit: Payer: Self-pay

## 2021-03-01 ENCOUNTER — Encounter (INDEPENDENT_AMBULATORY_CARE_PROVIDER_SITE_OTHER): Payer: Self-pay | Admitting: Family Medicine

## 2021-03-01 VITALS — BP 114/72 | HR 71 | Temp 98.1°F | Ht 69.0 in | Wt 264.0 lb

## 2021-03-01 DIAGNOSIS — E669 Obesity, unspecified: Secondary | ICD-10-CM | POA: Diagnosis not present

## 2021-03-01 DIAGNOSIS — E7849 Other hyperlipidemia: Secondary | ICD-10-CM | POA: Diagnosis not present

## 2021-03-01 DIAGNOSIS — Z6839 Body mass index (BMI) 39.0-39.9, adult: Secondary | ICD-10-CM | POA: Diagnosis not present

## 2021-03-01 DIAGNOSIS — I10 Essential (primary) hypertension: Secondary | ICD-10-CM

## 2021-03-01 DIAGNOSIS — K5909 Other constipation: Secondary | ICD-10-CM | POA: Diagnosis not present

## 2021-03-01 DIAGNOSIS — E559 Vitamin D deficiency, unspecified: Secondary | ICD-10-CM | POA: Diagnosis not present

## 2021-03-01 DIAGNOSIS — E8881 Metabolic syndrome: Secondary | ICD-10-CM | POA: Diagnosis not present

## 2021-03-01 DIAGNOSIS — E538 Deficiency of other specified B group vitamins: Secondary | ICD-10-CM

## 2021-03-01 MED ORDER — POLYETHYLENE GLYCOL 3350 17 GM/SCOOP PO POWD: 17.0000 g | Freq: Two times a day (BID) | ORAL | 0 refills | Status: DC | PRN

## 2021-03-01 MED ORDER — DOCUSATE SODIUM 100 MG PO CAPS: 100.0000 mg | ORAL_CAPSULE | Freq: Two times a day (BID) | ORAL | 0 refills | Status: DC

## 2021-03-01 NOTE — Patient Instructions (Signed)
The 10-year ASCVD risk score (Arnett DK, et al., 2019) is: 3%   Values used to calculate the score:     Age: 51 years     Sex: Female     Is Non-Hispanic African American: No     Diabetic: Yes     Tobacco smoker: No     Systolic Blood Pressure: 383 mmHg     Is BP treated: Yes     HDL Cholesterol: 57 mg/dL     Total Cholesterol: 220 mg/dL

## 2021-03-02 NOTE — Progress Notes (Signed)
Chief Complaint:   OBESITY [name] is here to discuss her progress with her obesity treatment plan along with follow-up of her obesity related diagnoses. [name] is on the Category 1 Plan and states she is following her eating plan approximately 75% of the time. [name] states she is not currently exercising.  Today's visit was #: 2 Starting weight: 270 lbs Starting date: 02/15/2021 Today's weight: 264 lbs Today's date: 03/01/2021 Total lbs lost to date: 6 Total lbs lost since last in-office visit: 6  Interim History: [name] is here today for her first follow-up office visit since starting the program with [name].  All blood work/ lab tests that were recently ordered by myself or an outside provider were reviewed with patient today per their request.   Extended time was spent counseling her on all new disease processes that were discovered or preexisting ones that are affected by BMI.  she understands that many of these abnormalities will need to monitored regularly along with the current treatment plan of prudent dietary changes, in which we are making each and every office visit, to improve these health parameters.  Pt reports she feels nauseated eating so much at once, especially the meat.   She normally eats out 5 days a week but hasn't eaten out at all since her first [place]. She is proud of herself as are we.    Subjective:   1. Insulin resistance New.  Discussed labs with patient today. Goal is HgbA1c < 5.7, fasting insulin closer to 5.    2. Essential hypertension Discussed labs with patient today- CMP.  Review: taking medications as instructed, no medication side effects noted, no chest pain on exertion, no dyspnea on exertion, no swelling of ankles.   3. B12 deficiency Discussed labs with patient today. At goal. Medication: OTC B12 5000 mcg QD  4. Other hyperlipidemia Worsening. Discussed labs with patient today. Medication: None  The 10-year ASCVD risk score  (Arnett DK, et al., 2019) is: 3%   Values used to calculate the score:     Age: 51 years     Sex: Female     Is Non-Hispanic African American: No     Diabetic: Yes     Tobacco smoker: No     Systolic Blood Pressure: 846 mmHg     Is BP treated: Yes     HDL Cholesterol: 57 mg/dL     Total Cholesterol: 220 mg/dL  5. Vitamin D deficiency Discussed labs with patient today. She is currently taking OTC vitamin D 5000 IU each day. She denies nausea, vomiting or muscle weakness.  6. Other constipation New onset since starting meal plan. Discussed labs with patient today.  Pt usually has a BM every 1-2 days and continues to do so, but reports a much smaller and at times, harder amount.     Assessment/Plan:    Meds ordered this encounter  Medications   polyethylene glycol powder (GLYCOLAX/MIRALAX) 17 GM/SCOOP powder    Sig: Take 17 g by mouth 2 (two) times daily as needed for moderate constipation.    Dispense:  255 g    Refill:  0   docusate sodium (COLACE) 100 MG capsule    Sig: Take 1 capsule (100 mg total) by mouth 2 (two) times daily.    Dispense:  60 capsule    Refill:  0   Cholecalciferol (D3 5000) 125 MCG (5000 UT) capsule    Sig: Take 1 capsule (5,000 Units total) by mouth  daily.     1. Insulin resistance NEW.   - I counseled patient on pathophysiology of the disease process of I.R.  - Stressed importance of dietary and lifestyle modifications to result in weight loss as first line txmnt  - continue to decrease simple carbs; increase fiber and proteins -> follow meal plan  - handouts on IR provided after education provided.  All concerns/questions addressed.   - anticipatory guidance given.   - We will recheck A1c and fasting insulin level in approximately 3 months from last check, or as deemed appropriate.    2. Essential hypertension At goal today.  Malley is working on healthy weight loss and exercise to improve blood pressure control. We will watch for signs of  hypotension as she continues her lifestyle modifications.   - Lifestyle changes such as following our low salt, heart healthy meal plan and engaging in a regular exercise program discussed  - Avoid buying foods that are: processed, frozen, or prepackaged to avoid excess salt. - We will continue to monitor closely alongside PCP/ specialists.  Pt reminded to also f/up with those individuals as instructed by them.  - We will continue to monitor symptoms as they relate to the her weight loss journey.    3. B12 deficiency Disease counseling done. Levels are Above goal.  Pt states that her neuro team recommends she take the higher dose b12.  Continue supplement per neurology recommendations, and we will let them reck levels as they see fit   4. Other hyperlipidemia No need for meds at this time.   Disease counseling done.   - Cont to decrease eating out and decrease fast foods that are high in sat/trans fats. Focus on prudent nutritional plan and lifestyle modifications. - Rec: aerobic activity with eventual goal of a minimum of 150+ min wk plus 2 days/ week of resistance strength training - Cardiovascular risk and specific lipid/LDL goals reviewed.  We discussed several lifestyle modifications today and Kana will continue to work on diet, exercise and weight loss efforts.  - Will continue routine screening as patient continues with health goals and weight loss journey  Last lipid panel as follows:  Lab Results  Component Value Date   CHOL 220 (H) 02/15/2021   HDL 57 02/15/2021   LDLCALC 136 (H) 02/15/2021   TRIG 151 (H) 02/15/2021   CHOLHDL 3.7 12/29/2016     5. Vitamin D deficiency At goal.  - Discussed importance of vitamin D to their health and well-being.  - possible symptoms of low Vitamin D can be low energy, depressed mood, muscle aches, joint aches, osteoporosis etc. - low Vitamin D levels may be linked to an increased risk of cardiovascular events and even increased risk of  cancers- such as colon and breast.  - I recommend pt continue current dose.  She agrees to continue to take OTC Vitamin D 5,000 IU QD - Informed patient this may be a lifelong thing, and she was encouraged to continue to take the medicine until told otherwise.   - we will need to monitor levels regularly to keep levels within normal limits.  - weight loss will likely improve availability of vitamin D, thus encouraged [name] to continue with meal plan and their weight loss efforts to further improve this condition - pt's questions and concerns regarding this condition addressed.   6. Other constipation- new Labs reviewed. Increase water intake to 100 oz per day unless instructed by a specialist otherwise, which pt denies today. Start-  polyethylene glycol powder (GLYCOLAX/MIRALAX) 17 GM/SCOOP powder; Take 17 g by mouth 2 (two) times daily as needed for moderate constipation.  Dispense: 255 g; Refill: 0 Start- docusate sodium (COLACE) 100 MG capsule; Take 1 capsule (100 mg total) by mouth 2 (two) times daily.  Dispense: 60 capsule; Refill: 0 - Jarely was informed that a decrease in bowel movement frequency is normal while losing weight, but stools should not be hard or painful.  - Patient advised to begin MiraLAX once daily or every other day titrated for one softer, normal caliber bowel movement each day.    - Discussed addition of laxatives such as Peri-colace and Colace as needed for refractory constipation.   - Adequate daily water intake encouraged along with activity/ movement  - Bowel habits and good bowel hygiene discussed with patient  - F/up with your primary care provider if W or NI to rule out causes for constipation besides nutrition  Additional Counseling-  Good Bowel Health: Your goal is to have one soft bowel movement each day. Drink at least half of your weight in ounces of water per day unless otherwise noted by one of your doctors that you must restrict water intake.  Eat plenty  of fiber (goal is over 25 grams each day).  It is best to get most of your fiber from dietary sources which includes leafy green vegetables, fresh fruit, and whole grains.  You may need to add fiber with the help of OTC fiber supplements.  These include the likes of Metamucil, Citrucel, Psyllium husks and Flaxseed.  If you are still having trouble, try adding Fleet's enemas or Magnesium Citrate. If all of these changes do not work, contact your PCP.    7. Obesity with current BMI of 39.1 [name] is currently in the action stage of change. As such, her goal is to continue with weight loss efforts. She has agreed to the Category 1 Plan.   Exercise goals:  As is  Behavioral modification strategies: no skipping meals, better snacking choices, and planning for success.  [name] has agreed to follow-up with our clinic in 2 weeks. She was informed of the importance of frequent follow-up visits to maximize her success with intensive lifestyle modifications for her multiple health conditions.    Objective:   Blood pressure 114/72, pulse 71, temperature 98.1 F (36.7 C), height 5\' 9"  (1.753 m), weight 264 lb (119.7 kg), SpO2 100 %. Body mass index is 38.99 kg/m.  General: Cooperative, alert, well developed, in no acute distress. HEENT: Conjunctivae and lids unremarkable. Cardiovascular: Regular rhythm.  Lungs: Normal work of breathing. Neurologic: No focal deficits.   Lab Results  Component Value Date   CREATININE 0.88 02/15/2021   BUN 8 02/15/2021   NA 138 02/15/2021   K 4.0 02/15/2021   CL 99 02/15/2021   CO2 25 02/15/2021   Lab Results  Component Value Date   ALT 14 02/15/2021   AST 15 02/15/2021   ALKPHOS 71 02/15/2021   BILITOT 0.5 02/15/2021   Lab Results  Component Value Date   HGBA1C 5.1 02/15/2021   HGBA1C 5.1 12/29/2016   HGBA1C 5.1 07/19/2016   HGBA1C 5.4 09/25/2014   HGBA1C 5.3 04/14/2014   Lab Results  Component Value Date   INSULIN 9.3 02/15/2021   Lab Results   Component Value Date   TSH 2.660 02/15/2021   Lab Results  Component Value Date   CHOL 220 (H) 02/15/2021   HDL 57 02/15/2021   LDLCALC 136 (H) 02/15/2021  TRIG 151 (H) 02/15/2021   CHOLHDL 3.7 12/29/2016   Lab Results  Component Value Date   VD25OH 49.4 02/15/2021   Lab Results  Component Value Date   WBC 8.6 02/15/2021   HGB 14.7 02/15/2021   HCT 43.8 02/15/2021   MCV 91 02/15/2021   PLT 260 02/15/2021   Lab Results  Component Value Date   IRON 111 07/19/2020   TIBC 286 07/19/2020   FERRITIN 63 07/19/2020    Obesity Behavioral Intervention:   Approximately 15 minutes were spent on the discussion below.  ASK: We discussed the diagnosis of obesity with [name] today and [name] agreed to give [name] permission to discuss obesity behavioral modification therapy today.  ASSESS: Ota has the diagnosis of obesity and her BMI today is 39.1. [name] is in the action stage of change.   ADVISE: Deon was educated on the multiple health risks of obesity as well as the benefit of weight loss to improve her health. She was advised of the need for long term treatment and the importance of lifestyle modifications to improve her current health and to decrease her risk of future health problems.  AGREE: Multiple dietary modification options and treatment options were discussed and [name] agreed to follow the recommendations documented in the above note.  ARRANGE: [name] was educated on the importance of frequent visits to treat obesity as outlined per CMS and USPSTF guidelines and agreed to schedule her next follow up appointment today.  Attestation Statements:   Reviewed by clinician on day of visit: allergies, medications, problem list, medical history, surgical history, family history, social history, and previous encounter notes.  Coral Ceo, CMA, am acting as transcriptionist for [employer], DO.  I have reviewed the above documentation for accuracy and  completeness, and I agree with the above. Marjory Sneddon, D.O.  The [place] was signed into law in 2016 which includes the topic of electronic health records.  This provides immediate access to information in MyChart.  This includes consultation notes, operative notes, office notes, lab results and pathology reports.  If you have any questions about what you read please let [name] know at your next visit so we can discuss your concerns and take corrective action if need be.  We are right here with you.

## 2021-03-03 ENCOUNTER — Ambulatory Visit: Payer: PPO | Admitting: Physical Therapy

## 2021-03-05 MED ORDER — D3 5000 125 MCG (5000 UT) PO CAPS: 5000.0000 [IU] | ORAL_CAPSULE | Freq: Every day | ORAL | Status: AC

## 2021-03-09 ENCOUNTER — Other Ambulatory Visit: Payer: Self-pay

## 2021-03-09 ENCOUNTER — Ambulatory Visit: Payer: PPO | Attending: Family Medicine

## 2021-03-09 DIAGNOSIS — M6281 Muscle weakness (generalized): Secondary | ICD-10-CM | POA: Insufficient documentation

## 2021-03-09 DIAGNOSIS — R42 Dizziness and giddiness: Secondary | ICD-10-CM | POA: Insufficient documentation

## 2021-03-09 DIAGNOSIS — S060X0A Concussion without loss of consciousness, initial encounter: Secondary | ICD-10-CM | POA: Insufficient documentation

## 2021-03-09 DIAGNOSIS — R2681 Unsteadiness on feet: Secondary | ICD-10-CM | POA: Insufficient documentation

## 2021-03-09 NOTE — Therapy (Signed)
[facility] [address] [place], [place], [address] Phone: [phone]   Fax:  [phone]  Physical Therapy Evaluation  Patient Details  Name: [name] MRN: [id number] Date of Birth: [birth year] Referring Provider (PT): Rosemarie Ax, MD   Encounter Date: 03/09/2021   PT End of Session - 03/09/21 0846     Visit Number 1    Number of Visits 13    Date for PT Re-Evaluation 04/22/21    Authorization Type HTA (10th Visit PN)    Progress Note Due on Visit 10    PT Start Time 0846    PT Stop Time 0928    PT Time Calculation (min) 42 min    Activity Tolerance Patient tolerated treatment well    Behavior During Therapy [facility] for tasks assessed/performed             Past Medical History:  Diagnosis Date   Achilles tendinitis    ADHD (attention deficit hyperactivity disorder), inattentive type    Diagnosed as an adult after starting college   Allergy    Arthritis    Arthritis    Ataxia    Back pain    Bipolar I disorder 04/18/2014   most recent episode mixed   Borderline personality disorder 10/27/2011   Cataracts, bilateral    Chest pain    Chronic pain disorder    due to several injuries affecting numerous areas of her body throughout the years   Chronic paroxysmal hemicrania, 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   rt chest wall nodule removal   GANGLION CYST EXCISION  2011   lipoma removal     right bunioectomy     SHOULDER SURGERY  01/13/2011   right, partial tear   tumor resection left thigh      There were no vitals filed for this visit.    Subjective Assessment - 03/09/21 0849     Subjective Went to ED on 01/26/21. Reports that two dogs were in her yard, and as she was backing away she fell. Reports when she fell she landed on her that R Side. Patient reports she now has Sciatic on the right side.Believed to have hit her head on the ground or over flowerpot. Returned to ED on 01/29/21 for re-evaluation. Patient reports feels like she has been off balance more since  this incident, walking into more things. At work, she notes she has bumped into a few things. Reports even when she is seated, the room can fill like it is moving. Has mild nausea (but unsure if this is due to a new meal program). Denies any additional falls since ED. Did have some pain in the neck initially, but has calmed down.    Pertinent History Went to ED on 01/26/21. Reports that two dogs were in her yard, and as she was backing away she fell. Believed to have hit her head on the ground or over flowerpot. Returned to ED on 01/29/21 for re-evaluation.    Limitations Standing;Walking;House hold activities    Patient Stated Goals Walk a Straight Line; Stop bumping into things, feel more balanced    Currently in Pain? Yes    Pain Score 2     Pain Location Head    Pain Orientation Left    Pain Descriptors / Indicators Headache    Pain Type Acute pain    Pain Onset 1 to 4 weeks  ago                [facility] PT Assessment - 03/09/21 0001       Assessment   Medical Diagnosis Post Concussion Syndrome    Referring Provider (PT) Rosemarie Ax, MD    Hand Dominance Right    Prior Therapy Known to this clinic (Prior PT in 2021)      Precautions   Precautions Fall;Other (comment)    Precaution Comments Bipolar Disorder, ADHD, Achilles Tendinitis, Chronic Pain, Migraines, HIstory of Multiple Concussions, HTN, Insomnia      Restrictions   Weight Bearing Restrictions No      Balance Screen   Has the patient fallen in the past 6 months Yes    How many times? 2    Has the patient had a decrease in activity level because of a fear of falling?  No    Is the patient reluctant to leave their home because of a fear of falling?  No      Home Social worker Private residence    Living Arrangements Parent    Available Help at Discharge Family    Type of [place] Access Level entry   one small step up getting into the back door   [place] One level      Prior Function   Level of Independence Independent    Vocation Full time employment    Vocation Requirements Works at [employer]   Overall Cognitive Status Within [place] for tasks assessed      Observation/Other Assessments   Other Surveys  Dizziness Handicap Inventory [facility])   to be assessed at next session     Sensation   Light Touch Impaired by gross assessment    Additional Comments reports some tingling sensation on the lateral side of R hip      Coordination   Gross Motor Movements are Fluid and Coordinated Yes      ROM / Strength   AROM / PROM / Strength Strength      Strength   Overall Strength Deficits      Transfers   Transfers Sit to Stand;Stand to Sit    Sit to Stand 6: Modified independent (Device/Increase time)    Stand to Sit 6: Modified independent (Device/Increase time)      Ambulation/Gait   Ambulation Distance (  Feet) 100 Feet     Gait Pattern Step-through pattern;Wide base of support    Gait Comments increased sway/veering noted with ambulation.      High Level Balance   High Level Balance Comments Completed M-CTSIB: patient able to hold situation 1-2 for full 30 seconds, situation 3: <10 seconds; situation 4: unable to complete. Increased sway noted with all situations.                    Vestibular Assessment - 03/09/21 0001       Vestibular Assessment   General Observation patient has increased postural sway with static standing      Symptom Behavior   Subjective history of current problem See Subjective    Type of Dizziness  Imbalance;Vertigo;Unsteady with head/body turns;Lightheadedness    Frequency of Dizziness daily    Duration of Dizziness daily; with specific movements    Symptom Nature Motion provoked;Intermittent    Aggravating Factors Turning head quickly;Turning body quickly;Looking up to the ceiling;Activity in general;Sit to stand    Relieving Factors No known relieving factors    Progression of Symptoms No change since onset      Oculomotor Exam   Oculomotor Alignment Normal    Ocular ROM WNL    Spontaneous Absent    Gaze-induced  Absent    Smooth Pursuits Saccades    Saccades Slow      Oculomotor Exam-Fixation Suppressed    Left Head Impulse Positive. Moderate Dizziness. Reports this was super uncomfortable    Right Head Impulse Negative. Mild Dizziness      Vestibulo-Ocular Reflex   VOR 1 Head Only (x 1 viewing) No reports of dizziness; mild blurry vision reported.      Visual Acuity   Static 11    Dynamic 8   Moderate Dizziness     Positional Sensitivities   Nose to Right Knee No dizziness    Right Knee to Sitting Mild dizziness    Nose to Left Knee No dizziness    Left Knee to Sitting Mild dizziness    Head Turning x 5 No dizziness   reports no dizziness; because she wasn't moving her head fast enough   Head Nodding x 5 Mild dizziness    Pivot Right in  Standing No dizziness   increased postural sway   Pivot Left in Standing No dizziness   increased postural sway   Rolling Right Mild dizziness    Rolling Left Mild dizziness                Objective measurements completed on examination: See above findings.       PT Education - 03/09/21 0854     Education Details Educated on POC/Eval Findings    Person(s) Educated Patient    Methods Explanation    Comprehension Verbalized understanding              PT Short Term Goals - 03/09/21 0929       PT SHORT TERM GOAL #1   Title Patient will be independent with initial vestibular/balance HEP (All STGs Due: 03/30/21)    Baseline to be established    Time 3    Period Weeks    Status New    Target Date 03/30/21      PT SHORT TERM GOAL #2   Title FGA TBA and LTG to be set as applicable    Baseline TBA    Time 3    Period Weeks    Status  New      PT SHORT TERM GOAL #3   Title Pt will amb. 100' with horizontal head turns with minimal sway in path deviation for increased safety with environmental scanning with amb.    Baseline increased veering noted with ambulation    Time 3    Period Weeks    Status New      PT SHORT TERM GOAL #4   Title Pt will be able to hold situation 3 of M-CTSIB for >/= 20 seconds without LOB    Baseline < 10 seconds    Time 3    Period Weeks    Status New               PT Long Term Goals - 03/09/21 0931       PT LONG TERM GOAL #1   Title Pt will improve DVA to </= 2 line difference for improved gaze stabilization. (ALL LTGs Due: 04/22/21)    Baseline 4 line difference    Time 6    Period Weeks    Status New    Target Date 04/22/21      PT LONG TERM GOAL #2   Title Pt will improve MSQ to </= 1/5 on all components to demonstrate improved activity tolernace and reduced symptoms with functional activities    Baseline 2/5    Time 6    Period Weeks    Status New      PT LONG TERM GOAL #3   Title Increase FGA score by at least 4  points to increase safety with gait.    Baseline TBA    Time 6    Period Weeks    Status New      PT LONG TERM GOAL #4   Title Pt will be able to hold situation 4 of M-CTSIB for >/= 20 seconds without LOB    Baseline unable to complete    Time 6    Period Weeks    Status New      PT LONG TERM GOAL #5   Title Pt will be independent with final/progressive HEP for balance and vestibular exs.    Baseline no HEP established    Time 6    Period Weeks    Status New                    Plan - 03/09/21 0934     Clinical Impression Statement Pt is a 51 yr old female referred to Neuro OPPT for Post Concussion. Patient PMH significant for: Bipolar Disorder, ADHD, Achilles Tendinitis, Chronic Pain, Migraines, HIstory of Multiple Concussions, HTN, Insomnia. Patient sustained a recent concussion in Dec 2022 after fall, no loss of conciousness noted. Patient presents with the following impairments upon evaluation: abnormal oculomotor exam, dizziness/lightheadedness, positive HIT and abnormal DVA indicating impaired VOR, imbalance and unsteadiness on feet with ambulation. Impaired balance noted on firm and complaint surfaces with increased postural sway. Pt will benefit from skilled PT services to address impairments and maximize functional mobility to allow for improved tolerance for occupation with reduced fall risk.    Personal Factors and Comorbidities Comorbidity 3+;Time since onset of injury/illness/exacerbation    Comorbidities Bipolar Disorder, ADHD, Achilles Tendinitis, Chronic Pain, Migraines, HIstory of Multiple Concussions, HTN, Insomnia    Examination-Activity Limitations Bed Mobility;Locomotion Level;Stand;Bend    Examination-Participation Restrictions Occupation;Cleaning;Community Activity    Stability/Clinical Decision Making Stable/Uncomplicated    Clinical Decision Making Low    Rehab Potential Good  PT Frequency 2x / week    PT Duration 6 weeks    PT  Treatment/Interventions ADLs/Self Care Home Management;Canalith Repostioning;Aquatic Therapy;Cryotherapy;Moist Heat;Therapeutic exercise;Stair training;Gait training;Functional mobility training;Therapeutic activities;Balance training;Neuromuscular re-education;Patient/family education;Manual techniques;Passive range of motion;Dry needling;Vestibular    PT Next Visit Plan Assess FGA/DHI. Update Goals. Review old HEP. VOR/Balance Exercises    Consulted and Agree with Plan of Care Patient             Patient will benefit from skilled therapeutic intervention in order to improve the following deficits and impairments:  Decreased balance, Decreased activity tolerance, Dizziness, Decreased strength, Difficulty walking, Pain  Visit Diagnosis: Unsteadiness on feet  Dizziness and giddiness  Muscle weakness (generalized)     Problem List Patient Active Problem List   Diagnosis Date Noted   Concussion with no loss of consciousness 02/07/2021   Lumbar radiculopathy 02/07/2021   Shoulder impingement syndrome, left 11/01/2020   Sprain of metacarpophalangeal joint of left thumb 10/07/2020   Posterior tibial tendinitis of right lower extremity 09/13/2020   Labral tear of hip, degenerative 09/13/2020   Attention and concentration deficit 05/07/2020   Morbid obesity ([place]) 05/07/2020   Plantar wart 05/07/2020   Sleep apnea 05/07/2020   Urinary incontinence 29/09/299   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   Family history of genetic disease carrier    Family history of breast cancer    Family history of colon cancer    History of multiple concussions 11/20/2017   Ataxia 11/20/2017   Insomnia 02/17/2015   Right shoulder pain 12/31/2014   Episodic cluster headache, not intractable 12/02/2014   Chronic paroxysmal hemicrania, 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   Photophobia of both eyes 05/04/2014   Nausea with vomiting 05/04/2014    Bipolar I disorder, most recent episode mixed ([place]) 04/18/2014   Suicidal ideation 04/12/2014   Injury of right shoulder and upper arm 02/17/2014   Migraine with status migrainosus 01/08/2013   Chronic migraine 05/08/2012   Hypertension    Contact dermatitis 11/27/2011   Generalized anxiety disorder 10/27/2011   ADHD (attention deficit hyperactivity disorder), 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 [place], [place], [address] Phone: [phone]   Fax:  [phone]  Name: [name] MRN: [id number] Date of Birth: Jun 11, 1970

## 2021-03-14 ENCOUNTER — Other Ambulatory Visit: Payer: Self-pay

## 2021-03-14 ENCOUNTER — Ambulatory Visit: Payer: PPO

## 2021-03-14 DIAGNOSIS — R42 Dizziness and giddiness: Secondary | ICD-10-CM

## 2021-03-14 DIAGNOSIS — R2681 Unsteadiness on feet: Secondary | ICD-10-CM | POA: Diagnosis not present

## 2021-03-14 DIAGNOSIS — M6281 Muscle weakness (generalized): Secondary | ICD-10-CM

## 2021-03-14 NOTE — Patient Instructions (Signed)
Gaze Stabilization: Standing Feet Apart    Feet shoulder width apart, keeping eyes on target on wall 3-4 feet away, tilt head down 15-30 and move head side to side for 30 seconds. Repeat while moving head up and down for 30 seconds. Do 2-3 sessions per day.    Gaze Stabilization: Tip Card  1.Target must remain in focus, not blurry, and appear stationary while head is in motion. 2.Perform exercises with small head movements (45 to either side of midline). 3.Increase speed of head motion so long as target is in focus. 4.If you wear eyeglasses, be sure you can see target through lens (therapist will give specific instructions for bifocal / progressive lenses). 5.These exercises may provoke dizziness or nausea. Work through these symptoms. If too dizzy, slow head movement slightly. Rest between each exercise. 6.Exercises demand concentration; avoid distractions. 7.For safety, perform standing exercises close to a counter, wall, corner, or next to someone.  Copyright  VHI. All rights reserved.

## 2021-03-14 NOTE — Therapy (Signed)
[facility] [address] [place], [place], [address] Phone: [phone]   Fax:  [phone]  Physical Therapy Treatment  Patient Details  Name: [name] MRN: [id number] Date of Birth: 1971-01-13 Referring Provider (PT): Rosemarie Ax, MD   Encounter Date: 03/14/2021   PT End of Session - 03/14/21 0934     Visit Number 2    Number of Visits 13    Date for PT Re-Evaluation 04/22/21    Authorization Type HTA (10th Visit PN)    Progress Note Due on Visit 10    PT Start Time 0931    PT Stop Time 1011    PT Time Calculation (min) 40 min    Activity Tolerance Patient tolerated treatment well    Behavior During Therapy [facility] for tasks assessed/performed             Past Medical History:  Diagnosis Date   Achilles tendinitis    ADHD (attention deficit hyperactivity disorder), inattentive type    Diagnosed as an adult after starting college   Allergy    Arthritis    Arthritis    Ataxia    Back pain    Bipolar I disorder 04/18/2014   most recent episode mixed   Borderline personality disorder 10/27/2011   Cataracts, bilateral    Chest pain    Chronic pain disorder    due to several injuries affecting numerous areas of her body throughout the years   Chronic paroxysmal hemicrania, 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   rt chest wall nodule removal   GANGLION CYST EXCISION  2011   lipoma removal     right bunioectomy     SHOULDER SURGERY  01/13/2011   right, partial tear   tumor resection left thigh      There were no vitals filed for this visit.   Subjective Assessment - 03/14/21 0933     Subjective Patient was nausea/dizzy over the weekend. No nausea this morning but hasnt ate anything this morning. No dizziness currently. No falls.    Pertinent History Went to ED on 01/26/21. Reports that two dogs were in her yard, and as she was backing away she fell. Believed to have hit her head on the ground or over flowerpot. Returned to ED on 01/29/21 for re-evaluation.    Limitations Standing;Walking;House hold  activities    Patient Stated Goals Walk a Straight Line; Stop bumping into things, feel more balanced    Currently in Pain? No/denies    Pain Onset 1 to 4 weeks ago                [facility] PT Assessment - 03/14/21 0001       Observation/Other Assessments   Other Surveys  Dizziness Handicap Inventory (DHI)    Dizziness Handicap Inventory (DHI)  56 (Severe Handicap)      Functional Gait  Assessment   Gait assessed  Yes    Gait Level Surface Walks 20 ft in less than 5.5 sec, no assistive devices, good speed, no evidence for imbalance, normal gait pattern, deviates no more than 6 in outside of the 12 in walkway width.    Change in Gait Speed Able to smoothly change walking speed without loss of balance or gait deviation. Deviate no more than 6 in outside of the 12 in walkway width.    Gait with Horizontal Head Turns Performs head turns  smoothly with slight change in gait velocity (eg, minor disruption to smooth gait path), deviates 6-10 in outside 12 in walkway width, or uses an assistive device.    Gait with Vertical Head Turns Performs task with slight change in gait velocity (eg, minor disruption to smooth gait path), deviates 6 - 10 in outside 12 in walkway width or uses assistive device    Gait and Pivot Turn Pivot turns safely in greater than 3 sec and stops with no loss of balance, or pivot turns safely within 3 sec and stops with mild imbalance, requires small steps to catch balance.    Step Over Obstacle Is able to step over one shoe box (4.5 in total height) but must slow down and adjust steps to clear box safely. May require verbal cueing.    Gait with Narrow Base of Support Ambulates less than 4 steps heel to toe or cannot perform without assistance.    Gait with Eyes Closed Walks 20 ft, slow speed, abnormal gait pattern, evidence for imbalance, deviates 10-15 in outside 12 in walkway width. Requires more than 9 sec to ambulate 20 ft.   nausea   Ambulating Backwards Walks 20 ft, uses assistive device, slower speed, mild gait deviations, deviates 6-10 in outside 12 in walkway width.   nausea   Steps Alternating feet, must use rail.    Total Score 18    FGA comment: 18/30               Vestibular Treatment/Exercise - 03/14/21 0001       Vestibular Treatment/Exercise   Vestibular Treatment Provided Gaze    Gaze Exercises X1 Viewing Horizontal;X1 Viewing Vertical      X1 Viewing Horizontal   Foot Position bil. stance - on floor    Reps 2    Comments x 30 seconds, solid background. Mild dizziness and nausea      X1 Viewing Vertical   Foot Position bil. stance - on floor    Reps 2    Comments x 30 seconds, solid background. no dizziness and mild nausea            Reviewed VOR and Updated, Handout Provided:   Gaze Stabilization: Standing Feet Apart    Feet shoulder width apart, keeping eyes on  target on wall 3-4 feet away, tilt head down 15-30 and move head side to side for 30 seconds. Repeat while moving head up and  down for 30 seconds. Do 2-3 sessions per day.    Gaze Stabilization: Tip Card  1.Target must remain in focus, not blurry, and appear stationary while head is in motion. 2.Perform exercises with small head movements (45 to either side of midline). 3.Increase speed of head motion so long as target is in focus. 4.If you wear eyeglasses, be sure you can see target through lens (therapist will give specific instructions for bifocal / progressive lenses). 5.These exercises may provoke dizziness or nausea. Work through these symptoms. If too dizzy, slow head movement slightly. Rest between each exercise. 6.Exercises demand concentration; avoid distractions. 7.For safety, perform standing exercises close to a counter, wall, corner, or next to someone.  Copyright  VHI. All rights reserved.          PT Education - 03/14/21 1004     Education Details VOR HEP; FGA Results    Person(s) Educated Patient    Methods Explanation;Demonstration;Handout    Comprehension Verbalized understanding;Returned demonstration              PT Short Term Goals - 03/14/21 1158       PT SHORT TERM GOAL #1   Title Patient will be independent with initial vestibular/balance HEP (All STGs Due: 03/30/21)    Baseline to be established    Time 3    Period Weeks    Status New    Target Date 03/30/21      PT SHORT TERM GOAL #2   Title FGA TBA and LTG to be set as applicable    Baseline TBA; 18/30    Time 3    Period Weeks    Status Achieved      PT SHORT TERM GOAL #3   Title Pt will amb. 100' with horizontal head turns with minimal sway in path deviation for increased safety with environmental scanning with amb.    Baseline increased veering noted with ambulation    Time 3    Period Weeks    Status New      PT SHORT TERM GOAL #4   Title Pt will be able to hold situation  3 of M-CTSIB for >/= 20 seconds without LOB    Baseline < 10 seconds    Time 3    Period Weeks    Status New               PT Long Term Goals - 03/14/21 1158       PT LONG TERM GOAL #1   Title Pt will improve DVA to </= 2 line difference for improved gaze stabilization. (ALL LTGs Due: 04/22/21)    Baseline 4 line difference    Time 6    Period Weeks    Status New    Target Date 04/22/21      PT LONG TERM GOAL #2   Title Pt will improve MSQ to </= 1/5 on all components to demonstrate improved activity tolernace and reduced symptoms with functional activities    Baseline 2/5    Time 6    Period Weeks    Status New      PT LONG TERM GOAL #3   Title Increase FGA score by at least 4 points to increase safety with gait.    Baseline TBA; 18/30    Time 6    Period Weeks    Status New      PT LONG TERM GOAL #4   Title Pt will be able to hold situation 4 of  M-CTSIB for >/= 20 seconds without LOB    Baseline unable to complete    Time 6    Period Weeks    Status New      PT LONG TERM GOAL #5   Title Pt will be independent with final/progressive HEP for balance and vestibular exs.    Baseline no HEP established    Time 6    Period Weeks    Status New                   Plan - 03/14/21 1013     Clinical Impression Statement Completed FGA for further balance assesment today, with patient scoring 18/30, indicating increased fall risk, increased nausea noted with head turns, backwards walking, and vision removed.  Rest of session spent reviewing gaze stabilization and re-establishing VOR HEP. Patient having mild dizziness and nausea requiring intermittent rest breaks. Will continue per POC.    Personal Factors and Comorbidities Comorbidity 3+;Time since onset of injury/illness/exacerbation    Comorbidities Bipolar Disorder, ADHD, Achilles Tendinitis, Chronic Pain, Migraines, HIstory of Multiple Concussions, HTN, Insomnia    Examination-Activity Limitations Bed  Mobility;Locomotion Level;Stand;Bend    Examination-Participation Restrictions Occupation;Cleaning;Community Activity    Stability/Clinical Decision Making Stable/Uncomplicated    Rehab Potential Good    PT Frequency 2x / week    PT Duration 6 weeks    PT Treatment/Interventions ADLs/Self Care Home Management;Canalith Repostioning;Aquatic Therapy;Cryotherapy;Moist Heat;Therapeutic exercise;Stair training;Gait training;Functional mobility training;Therapeutic activities;Balance training;Neuromuscular re-education;Patient/family education;Manual techniques;Passive range of motion;Dry needling;Vestibular    PT Next Visit Plan Continue VOR/Balance Exercises    Consulted and Agree with Plan of Care Patient             Patient will benefit from skilled therapeutic intervention in order to improve the following deficits and impairments:  Decreased balance, Decreased activity tolerance, Dizziness, Decreased strength, Difficulty walking, Pain  Visit Diagnosis: Unsteadiness on feet  Dizziness and giddiness  Muscle weakness (generalized)     Problem List Patient Active Problem List   Diagnosis Date Noted   Concussion with no loss of consciousness 02/07/2021   Lumbar radiculopathy 02/07/2021   Shoulder impingement syndrome, left 11/01/2020   Sprain of metacarpophalangeal joint of left thumb 10/07/2020   Posterior tibial tendinitis of right lower extremity 09/13/2020   Labral tear of hip, degenerative 09/13/2020   Attention and concentration deficit 05/07/2020   Morbid obesity (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   Photophobia of both eyes 05/04/2014   Nausea with vomiting 05/04/2014   Bipolar I disorder, most recent episode mixed ([place]) 04/18/2014   Suicidal ideation 04/12/2014   Injury of right shoulder and upper arm 02/17/2014   Migraine with status migrainosus 01/08/2013   Chronic migraine 05/08/2012   Hypertension    Contact dermatitis 11/27/2011   Generalized anxiety disorder 10/27/2011   ADHD (attention deficit hyperactivity disorder), 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

## 2021-03-15 ENCOUNTER — Other Ambulatory Visit: Payer: Self-pay

## 2021-03-15 ENCOUNTER — Ambulatory Visit (INDEPENDENT_AMBULATORY_CARE_PROVIDER_SITE_OTHER): Payer: PPO | Admitting: Physician Assistant

## 2021-03-15 ENCOUNTER — Encounter (INDEPENDENT_AMBULATORY_CARE_PROVIDER_SITE_OTHER): Payer: Self-pay | Admitting: Physician Assistant

## 2021-03-15 VITALS — BP 121/66 | HR 68 | Temp 97.8°F | Ht 69.0 in | Wt 260.0 lb

## 2021-03-15 DIAGNOSIS — Z6838 Body mass index (BMI) 38.0-38.9, adult: Secondary | ICD-10-CM

## 2021-03-15 DIAGNOSIS — E669 Obesity, unspecified: Secondary | ICD-10-CM | POA: Diagnosis not present

## 2021-03-15 DIAGNOSIS — E559 Vitamin D deficiency, unspecified: Secondary | ICD-10-CM | POA: Diagnosis not present

## 2021-03-15 NOTE — Progress Notes (Signed)
Chief Complaint:   OBESITY [name] is here to discuss her progress with her obesity treatment plan along with follow-up of her obesity related diagnoses. [name] is on the Category 1 Plan and states she is following her eating plan approximately 70% of the time. [name] states she is active while building a fence, and she is walking for 10-15 minutes 2 times per week.  Today's visit was #: 3 Starting weight: 270 lbs Starting date: 02/15/2021 Today's weight: 260 lbs Today's date: 03/15/2021 Total lbs lost to date: 10 Total lbs lost since last in-office visit: 4  Interim History: [name] states that she had a GI bug 2 days ago, and she didn't eat on the plan but otherwise she has been doing well. She doesn't like a lot of meat but she is making herself eat it anyway.  Subjective:   1. Vitamin D deficiency [name] is on 5,000 units of Vit D daily. Last Vit D level was 49.4.  Assessment/Plan:   1. Vitamin D deficiency [name] OTC Vitamin D 5,000 IU daily and will follow-up for routine testing of Vitamin D, at least 2-3 times per year to avoid over-replacement.  2. Obesity with current BMI of 38.4 [name] is currently in the action stage of change. As such, her goal is to continue with weight loss efforts. She has agreed to the Category 1 Plan.   Protein equivalent list  was given.  Exercise goals: As is.   Behavioral modification strategies: meal planning and cooking strategies and planning for success.  [name] has agreed to follow-up with our clinic in 2 weeks. She was informed of the importance of frequent follow-up visits to maximize her success with intensive lifestyle modifications for her multiple health conditions.   Objective:   Blood pressure 121/66, pulse 68, temperature 97.8 F (36.6 C), height 5\' 9"  (1.753 m), weight 260 lb (117.9 kg), SpO2 98 %. Body mass index is 38.4 kg/m.  General: Cooperative, alert, well developed, in no acute distress. HEENT: Conjunctivae and  lids unremarkable. Cardiovascular: Regular rhythm.  Lungs: Normal work of breathing. Neurologic: No focal deficits.   Lab Results  Component Value Date   CREATININE 0.88 02/15/2021   BUN 8 02/15/2021   NA 138 02/15/2021   K 4.0 02/15/2021   CL 99 02/15/2021   CO2 25 02/15/2021   Lab Results  Component Value Date   ALT 14 02/15/2021   AST 15 02/15/2021   ALKPHOS 71 02/15/2021   BILITOT 0.5 02/15/2021   Lab Results  Component Value Date   HGBA1C 5.1 02/15/2021   HGBA1C 5.1 12/29/2016   HGBA1C 5.1 07/19/2016   HGBA1C 5.4 09/25/2014   HGBA1C 5.3 04/14/2014   Lab Results  Component Value Date   INSULIN 9.3 02/15/2021   Lab Results  Component Value Date   TSH 2.660 02/15/2021   Lab Results  Component Value Date   CHOL 220 (H) 02/15/2021   HDL 57 02/15/2021   LDLCALC 136 (H) 02/15/2021   TRIG 151 (H) 02/15/2021   CHOLHDL 3.7 12/29/2016   Lab Results  Component Value Date   VD25OH 49.4 02/15/2021   Lab Results  Component Value Date   WBC 8.6 02/15/2021   HGB 14.7 02/15/2021   HCT 43.8 02/15/2021   MCV 91 02/15/2021   PLT 260 02/15/2021   Lab Results  Component Value Date   IRON 111 07/19/2020   TIBC 286 07/19/2020   FERRITIN 63 07/19/2020    Obesity Behavioral Intervention:  Approximately 15 minutes were spent on the discussion below.  ASK: We discussed the diagnosis of obesity with [name] today and [name] agreed to give [place] permission to discuss obesity behavioral modification therapy today.  ASSESS: [name] has the diagnosis of obesity and her BMI today is 38.4. [name] is in the action stage of change.   ADVISE: [name] was educated on the multiple health risks of obesity as well as the benefit of weight loss to improve her health. She was advised of the need for long term treatment and the importance of lifestyle modifications to improve her current health and to decrease her risk of future health problems.  AGREE: Multiple dietary modification  options and treatment options were discussed and [name] agreed to follow the recommendations documented in the above note.  ARRANGE: [name] was educated on the importance of frequent visits to treat obesity as outlined per CMS and USPSTF guidelines and agreed to schedule her next follow up appointment today.  Attestation Statements:   Reviewed by clinician on day of visit: allergies, medications, problem list, medical history, surgical history, family history, social history, and previous encounter notes.   Wilhemena Durie, am acting as transcriptionist for [employer], PA-C.  I have reviewed the above documentation for accuracy and completeness, and I agree with the above. [name], PA-C

## 2021-03-17 ENCOUNTER — Other Ambulatory Visit: Payer: Self-pay

## 2021-03-17 ENCOUNTER — Ambulatory Visit: Payer: PPO | Admitting: Physical Therapy

## 2021-03-17 DIAGNOSIS — R2681 Unsteadiness on feet: Secondary | ICD-10-CM | POA: Diagnosis not present

## 2021-03-17 DIAGNOSIS — R42 Dizziness and giddiness: Secondary | ICD-10-CM

## 2021-03-17 NOTE — Patient Instructions (Signed)
Stretching: Piriformis -- SEATED POSITION - CROSS RIGHT LEG OVER LEFT LEG AND PULL TOWARD CHEST    Cross left leg over other thigh and place elbow over outside of knee. Gently stretch buttock muscles by pushing bent knee across body. Hold _20-30___ seconds. Repeat ___2_ times per set. Do _1___ sets per session. Do _2-3___ sessions per day.  http://orth.exer.us/651   Copyright  VHI. All rights reserved.     Piriformis Stretch - Supine    Pull uninvolved knee across body to opposite shoulder. Hold slight stretch for _20-30__ seconds. Repeat with involved leg. Repeat _1__ times. Do _2-3__ times per day.  Copyright  VHI. All rights reserved.

## 2021-03-18 NOTE — Therapy (Signed)
[facility] [address] [place], [place], [address] Phone: [phone]   Fax:  [phone]  Physical Therapy Treatment  Patient Details  Name: [name] MRN: [id number] Date of Birth: [birth year] Referring Provider (PT): Rosemarie Ax, MD   Encounter Date: 03/17/2021   PT End of Session - 03/18/21 1054     Visit Number 3    Number of Visits 13    Date for PT Re-Evaluation 04/22/21    Authorization Type HTA (10th Visit PN)    Progress Note Due on Visit 10    PT Start Time 1105    PT Stop Time 1147    PT Time Calculation (min) 42 min    Activity Tolerance Patient tolerated treatment well    Behavior During Therapy [facility] for tasks assessed/performed             Past Medical History:  Diagnosis Date   Achilles tendinitis    ADHD (attention deficit hyperactivity disorder), inattentive type    Diagnosed as an adult after starting college   Allergy    Arthritis    Arthritis    Ataxia    Back pain    Bipolar I disorder 04/18/2014   most recent episode mixed   Borderline personality disorder 10/27/2011   Cataracts, bilateral    Chest pain    Chronic pain disorder    due to several injuries affecting numerous areas of her body throughout the years   Chronic paroxysmal hemicrania, 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   rt chest wall nodule removal   GANGLION CYST EXCISION  2011   lipoma removal     right bunioectomy     SHOULDER SURGERY  01/13/2011   right, partial tear   tumor resection left thigh      There were no vitals filed for this visit.   Subjective Assessment - 03/17/21 1108     Subjective Pt states she hit her head Monday evening  when she was plugging in her drill - "I have a knot on my head" ; also states she was backing down her driveway and hit the fence - says right side of car will need body work to fix it    Pertinent History Went to ED on 01/26/21. Reports that two dogs were in her yard, and as she was  backing away she fell. Believed to have hit her head on the ground or over flowerpot. Returned to ED on 01/29/21 for re-evaluation.    Limitations Standing;Walking;House hold activities    Patient Stated Goals Walk a Straight Line; Stop bumping into things, feel more balanced    Currently in Pain? Yes    Pain Score 7     Pain Location Hip    Pain Orientation Right    Pain Descriptors / Indicators Aching;Discomfort;Radiating    Pain Type Acute pain    Pain Onset 1 to 4 weeks ago    Pain Frequency Intermittent                                    Balance Exercises - 03/18/21 0001       Balance Exercises: Standing   Standing Eyes Opened Wide (BOA);Head turns;Foam/compliant surface;Solid surface;5 reps   horizontal and vertical - pt stood on 2 pillows in corner; 5 reps each direction   Standing Eyes Closed Wide (BOA);Head turns;Foam/compliant surface;5 reps   stood for 10 secs EC no head movement;  performed 5 reps horizontal & vertical head turns; tactile cues to reduce trunk movement and isolate head movement   Rockerboard Anterior/posterior;EO;EC;Other reps (comment)   10 reps with EO, 10 reps with EC with UE support   Gait with Head Turns Forward;2 reps   115'  - slow gait speed with intermittent horizontal head turns   Marching Foam/compliant surface;Solid surface;Static   10 reps on floor, 10 reps on pillow; EO and then with EC - no head movement added   Sit to Stand Standard surface;Other (comment)   5 reps with EC with CGA           Pt performed Rt piriformis stretch in seated position 1 rep - 15 sec hold (due to c/o Rt sciatica)    PT Education - 03/18/21 1053     Education Details piriformis stretch given for HEP as pt reports having sciatica Rt side today    Person(s) Educated Patient    Methods Explanation;Demonstration;Handout    Comprehension Verbalized understanding;Returned demonstration              PT Short Term Goals - 03/18/21 1057       PT SHORT TERM GOAL #1   Title Patient will be independent with initial vestibular/balance HEP (All STGs Due: 03/30/21)    Baseline to be established    Time 3    Period Weeks    Status New    Target Date 03/30/21      PT SHORT TERM GOAL #2   Title FGA TBA and LTG to be set as applicable    Baseline TBA; 18/30    Time 3    Period Weeks    Status Achieved      PT SHORT TERM GOAL #3   Title Pt will amb. 100' with horizontal head turns with minimal sway in path deviation for increased safety with environmental scanning with amb.    Baseline increased veering noted with ambulation    Time 3    Period Weeks    Status New      PT SHORT TERM GOAL #4   Title Pt will be able to hold situation 3 of M-CTSIB for >/= 20 seconds without LOB    Baseline < 10 seconds    Time 3    Period Weeks  Status New               PT Long Term Goals - 03/18/21 1057       PT LONG TERM GOAL #1   Title Pt will improve  DVA to </= 2 line difference for improved gaze stabilization. (ALL LTGs Due: 04/22/21)    Baseline 4 line difference    Time 6    Period Weeks    Status New    Target Date 04/22/21      PT LONG TERM GOAL #2   Title Pt will improve MSQ to </= 1/5 on all components to demonstrate improved activity tolernace and reduced symptoms with functional activities    Baseline 2/5    Time 6    Period Weeks    Status New      PT LONG TERM GOAL #3   Title Increase FGA score by at least 4 points to increase safety with gait.    Baseline TBA; 18/30    Time 6    Period Weeks    Status New      PT LONG TERM GOAL #4   Title Pt will be able to hold situation 4 of M-CTSIB for >/= 20 seconds without LOB    Baseline unable to complete    Time 6    Period Weeks    Status New      PT LONG TERM GOAL #5   Title Pt will be independent with final/progressive HEP for balance and vestibular exs.    Baseline no HEP established    Time 6    Period Weeks    Status New                   Plan - 03/18/21 1054     Clinical Impression Statement Session focused on balance exercises with vestibular input incorporated; pt had mild postural instability with marching on pillows with EO and mod. postural instability with marching with EC; pt needed tactile cues to prevent trunk rotation with standing on compliant surface with horizontal head turns.  Cont with POC.    Personal Factors and Comorbidities Comorbidity 3+;Time since onset of injury/illness/exacerbation    Comorbidities Bipolar Disorder, ADHD, Achilles Tendinitis, Chronic Pain, Migraines, HIstory of Multiple Concussions, HTN, Insomnia    Examination-Activity Limitations Bed Mobility;Locomotion Level;Stand;Bend    Examination-Participation Restrictions Occupation;Cleaning;Community Activity    Stability/Clinical Decision Making Stable/Uncomplicated    Rehab Potential Good    PT Frequency 2x / week    PT Duration 6 weeks    PT  Treatment/Interventions ADLs/Self Care Home Management;Canalith Repostioning;Aquatic Therapy;Cryotherapy;Moist Heat;Therapeutic exercise;Stair training;Gait training;Functional mobility training;Therapeutic activities;Balance training;Neuromuscular re-education;Patient/family education;Manual techniques;Passive range of motion;Dry needling;Vestibular    PT Next Visit Plan Continue VOR/Balance Exercises    Consulted and Agree with Plan of Care Patient             Patient will benefit from skilled therapeutic intervention in order to improve the following deficits and impairments:  Decreased balance, Decreased activity tolerance, Dizziness, Decreased strength, Difficulty walking, Pain  Visit Diagnosis: Unsteadiness on feet  Dizziness and giddiness     Problem List Patient Active Problem List   Diagnosis Date Noted   Concussion with no loss of consciousness 02/07/2021   Lumbar radiculopathy 02/07/2021   Shoulder impingement syndrome, left 11/01/2020   Sprain of metacarpophalangeal joint of left thumb 10/07/2020   Posterior tibial tendinitis of right lower extremity 09/13/2020   Labral tear of hip, degenerative 09/13/2020   Attention and  concentration deficit 05/07/2020   Morbid obesity ([place]) 05/07/2020   Plantar wart 05/07/2020   Sleep apnea 05/07/2020   Urinary incontinence 46/04/7996   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   Family history of genetic disease carrier    Family history of breast cancer    Family history of colon cancer    History of multiple concussions 11/20/2017   Ataxia 11/20/2017   Insomnia 02/17/2015   Right shoulder pain 12/31/2014   Episodic cluster headache, not intractable 12/02/2014   Chronic paroxysmal hemicrania, 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   Photophobia of both eyes 05/04/2014   Nausea with vomiting 05/04/2014   Bipolar I disorder, most recent episode mixed ([place]) 04/18/2014    Suicidal ideation 04/12/2014   Injury of right shoulder and upper arm 02/17/2014   Migraine with status migrainosus 01/08/2013   Chronic migraine 05/08/2012   Hypertension    Contact dermatitis 11/27/2011   Generalized anxiety disorder 10/27/2011   ADHD (attention deficit hyperactivity disorder), 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

## 2021-03-21 ENCOUNTER — Other Ambulatory Visit: Payer: Self-pay

## 2021-03-21 ENCOUNTER — Telehealth: Payer: Self-pay | Admitting: *Deleted

## 2021-03-21 ENCOUNTER — Encounter: Payer: Self-pay | Admitting: Physical Therapy

## 2021-03-21 ENCOUNTER — Ambulatory Visit: Payer: PPO | Admitting: Physical Therapy

## 2021-03-21 DIAGNOSIS — R42 Dizziness and giddiness: Secondary | ICD-10-CM

## 2021-03-21 DIAGNOSIS — R2681 Unsteadiness on feet: Secondary | ICD-10-CM

## 2021-03-21 NOTE — Therapy (Signed)
[facility] [address] [place], [place], [address] Phone: [phone]   Fax:  [phone]  Physical Therapy Treatment  Patient Details  Name: CAPRICE MCCAFFREY MRN: [id number] Date of Birth: April 12, 1970 Referring Provider (PT): Rosemarie Ax, MD   Encounter Date: 03/21/2021   PT End of Session - 03/21/21 0957     Visit Number 4    Number of Visits 13    Date for PT Re-Evaluation 04/22/21    Authorization Type HTA (10th Visit PN)    Progress Note Due on Visit 10    PT Start Time 0803    PT Stop Time 0846    PT Time Calculation (min) 43 min    Activity Tolerance Patient tolerated treatment well    Behavior During Therapy [facility] for tasks assessed/performed             Past Medical History:  Diagnosis Date   Achilles tendinitis    ADHD (attention deficit hyperactivity disorder), inattentive type    Diagnosed as an adult after starting college   Allergy    Arthritis    Arthritis    Ataxia    Back pain    Bipolar I disorder 04/18/2014   most recent episode mixed   Borderline personality disorder 10/27/2011   Cataracts, bilateral    Chest pain    Chronic pain disorder    due to several injuries affecting numerous areas of her body throughout the years   Chronic paroxysmal hemicrania, 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   rt chest wall nodule removal   GANGLION CYST EXCISION  2011   lipoma removal     right bunioectomy     SHOULDER SURGERY  01/13/2011   right, partial tear   tumor resection left thigh      There were no vitals filed for this visit.   Subjective Assessment - 03/21/21 0805     Subjective Pt states she went to [employer] store on Thursday and states there was a lady with 2 dogs there "that she could not control" -reports she backed into the self checkout trying to get away from them;  states she hit her head on the trunk of car on Friday as she was having gravel loaded    Pertinent History Went to ED on 01/26/21. Reports that two dogs  were in her yard, and as she was backing away she fell. Believed to have hit her head on the ground or over flowerpot. Returned to ED on 01/29/21 for re-evaluation.    Limitations Standing;Walking;House hold activities    Patient Stated Goals Walk a Straight Line; Stop bumping into things, feel more balanced    Currently in Pain? No/denies                               [facility] Adult PT Treatment/Exercise - 03/21/21 0823       Ambulation/Gait   Ambulation/Gait Yes    Ambulation Distance (Feet) 230 Feet    Assistive device None    Gait Pattern Within Functional Limits    Ambulation Surface Level;Indoor    Gait Comments intermittent horizontal head turns on 1st lap, intermittent vertical head turns on 2nd lap   pt reported dizziness 2-3/10 after completion of 2nd lap     High Level Balance   High Level Balance Activities Backward walking;Sudden stops;Head turns    High  Level Balance Comments mild postural instability noted with backwards amb. 67'             Vestibular Treatment/Exercise - 03/21/21 0808       Vestibular Treatment/Exercise   Vestibular Treatment Provided Gaze    Gaze Exercises X1 Viewing Horizontal;X1 Viewing Vertical      X1 Viewing Horizontal   Foot Position bil. stance - on floor x 1 rep;  on 1 pillow x 1 rep    Time --   30 secs   Reps 2    Comments 30 seconds - dizziness rating 2/10      X1 Viewing Vertical   Foot Position bil. stance - on floor    Time --   30 secs   Reps 2    Comments 30 secs - dizziness rating 3/10                Balance Exercises - 03/21/21 0001       Balance Exercises: Standing   Rockerboard Anterior/posterior;Head turns;EO;EC   10 reps EO without UE support; 1 finger support on // bar on each hand 10 reps EC; keeping board balanced - EO - targets on either side of room 10 reps;   Gait with Head Turns Forward;1 rep   115' with horizontal head turns   Marching Foam/compliant surface;Static;10 reps   5 reps with EO and then 5 reps with EC; UE support prn with EC   Other Standing Exercises standing feet together on floor - EO - head turns horizontally 5 reps    Other Standing Exercises Comments Pt performed amb. 30' forward tossing and catching ball straight up, then tossing on Rt side/Lt side 50' x 1; amb. 50' making circles clockwise, counterclockwise 50' x 1 rep each direction; made letter "+" pattern 56' x1 rep with ball for improved gaze stabilization and visual tracking            Standing on mini trampoline - tossing green medicine 5 reps straight up, tossing Rt and Lt side sides 5 reps with CGA      PT Short Term Goals - 03/21/21 1001       PT SHORT TERM GOAL #1   Title Patient will be independent with initial vestibular/balance HEP (All STGs Due: 03/30/21)    Baseline to be established    Time 3    Period Weeks  Status New    Target Date 03/30/21      PT SHORT TERM  GOAL #2   Title FGA TBA and LTG to be set as applicable    Baseline TBA; 18/30    Time 3    Period Weeks    Status Achieved      PT SHORT TERM GOAL #3   Title Pt will amb. 100' with horizontal head turns with minimal sway in path deviation for increased safety with environmental scanning with amb.    Baseline increased veering noted with ambulation    Time 3    Period Weeks    Status New      PT SHORT TERM GOAL #4   Title Pt will be able to hold situation 3 of M-CTSIB for >/= 20 seconds without LOB    Baseline < 10 seconds    Time 3    Period Weeks    Status New               PT Long Term Goals - 03/21/21 1002       PT LONG TERM GOAL #1   Title Pt will improve DVA to </= 2 line difference for improved gaze stabilization. (ALL LTGs Due: 04/22/21)    Baseline 4 line difference    Time 6    Period Weeks    Status New    Target Date 04/22/21      PT LONG TERM GOAL #2   Title Pt will improve MSQ to </= 1/5 on all components to demonstrate improved activity tolernace and reduced symptoms with functional activities    Baseline 2/5    Time 6    Period Weeks    Status New      PT LONG TERM GOAL #3   Title Increase FGA score by at least 4 points to increase safety with gait.    Baseline TBA; 18/30    Time 6    Period Weeks    Status New      PT LONG TERM GOAL #4   Title Pt will be able to hold situation 4 of M-CTSIB for >/= 20 seconds without LOB    Baseline unable to complete    Time 6    Period Weeks    Status New      PT LONG TERM GOAL #5   Title Pt will be independent with final/progressive HEP for balance and vestibular exs.    Baseline no HEP established    Time 6    Period Weeks    Status New                   Plan - 03/21/21 0957     Clinical Impression Statement Pt performed balance/vestibular exercises with mild to moderate postural instability but no complete LOB; pt reported dizziness intensity at 2-3/10 consistently throughout session  with all exercises and activities.  Pt also reported some nausea with vestibular activities but was able to continue with only very few short rest breaks.  Pt reported most nausea with standing/stepping on mini trampoline tracking the ball (small green medicine ball used for this activity).  Cont with POC.    Personal Factors and Comorbidities Comorbidity 3+;Time since onset of injury/illness/exacerbation    Comorbidities Bipolar Disorder, ADHD, Achilles Tendinitis, Chronic Pain, Migraines, HIstory of Multiple Concussions, HTN, Insomnia    Examination-Activity Limitations Bed Mobility;Locomotion Level;Stand;Bend    Examination-Participation Restrictions Occupation;Cleaning;Community Activity    Stability/Clinical Decision Making Stable/Uncomplicated  Rehab Potential Good    PT Frequency 2x / week    PT Duration 6 weeks    PT Treatment/Interventions ADLs/Self Care Home Management;Canalith Repostioning;Aquatic Therapy;Cryotherapy;Moist Heat;Therapeutic exercise;Stair training;Gait training;Functional mobility training;Therapeutic activities;Balance training;Neuromuscular re-education;Patient/family education;Manual techniques;Passive range of motion;Dry needling;Vestibular    PT Next Visit Plan Continue VOR/Balance Exercises    Consulted and Agree with Plan of Care Patient             Patient will benefit from skilled therapeutic intervention in order to improve the following deficits and impairments:  Decreased balance, Decreased activity tolerance, Dizziness, Decreased strength, Difficulty walking, Pain  Visit Diagnosis: Unsteadiness on feet  Dizziness and giddiness     Problem List Patient Active Problem List   Diagnosis Date Noted   Concussion with no loss of consciousness 02/07/2021   Lumbar radiculopathy 02/07/2021   Shoulder impingement syndrome, left 11/01/2020   Sprain of metacarpophalangeal joint of left thumb 10/07/2020   Posterior tibial tendinitis of right lower  extremity 09/13/2020   Labral tear of hip, degenerative 09/13/2020   Attention and concentration deficit 05/07/2020   Morbid obesity ([place]) 05/07/2020   Plantar wart 05/07/2020   Sleep apnea 05/07/2020   Urinary incontinence 94/32/0037   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   Family history of genetic disease carrier    Family history of breast cancer    Family history of colon cancer    History of multiple concussions 11/20/2017   Ataxia 11/20/2017   Insomnia 02/17/2015   Right shoulder pain 12/31/2014   Episodic cluster headache, not intractable 12/02/2014   Chronic paroxysmal hemicrania, 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   Photophobia of both eyes 05/04/2014   Nausea with vomiting 05/04/2014   Bipolar I disorder, most recent episode mixed ([place]) 04/18/2014   Suicidal ideation 04/12/2014   Injury of right shoulder and upper arm 02/17/2014   Migraine with status migrainosus 01/08/2013   Chronic migraine 05/08/2012   Hypertension    Contact dermatitis 11/27/2011   Generalized anxiety disorder 10/27/2011   ADHD (attention deficit hyperactivity disorder), 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) 09/02/2010   Ganglion cyst 09/29/2009   Pes planus 07/01/2008    Vonette Grosso, [place], PT 03/21/2021, 10:03 AM  [facility] [address] [place], [place], [address] Phone: [phone]   Fax:  [phone]  Name: SHAKIAH WESTER MRN: [id number] Date of Birth: [birth year]

## 2021-03-21 NOTE — Telephone Encounter (Signed)
Received PA request for emglaity for pt.  [facility] [address] Emgality 120mg  every 30 days.  KEY [id number] P G43.009, G43.709. Approved 20-FEB-23:20-FEB-24 Emgality 120MG /ML [place] SOAJ Quantity:1.  [employer] MCR.

## 2021-03-22 ENCOUNTER — Encounter: Payer: Self-pay | Admitting: Adult Health

## 2021-03-23 DIAGNOSIS — S60452A Superficial foreign body of right middle finger, initial encounter: Secondary | ICD-10-CM | POA: Diagnosis not present

## 2021-03-23 DIAGNOSIS — M79644 Pain in right finger(s): Secondary | ICD-10-CM | POA: Diagnosis not present

## 2021-03-23 DIAGNOSIS — L03011 Cellulitis of right finger: Secondary | ICD-10-CM | POA: Diagnosis not present

## 2021-03-24 ENCOUNTER — Other Ambulatory Visit: Payer: Self-pay | Admitting: Adult Health

## 2021-03-25 ENCOUNTER — Other Ambulatory Visit: Payer: Self-pay

## 2021-03-25 ENCOUNTER — Ambulatory Visit: Payer: PPO

## 2021-03-25 DIAGNOSIS — R2681 Unsteadiness on feet: Secondary | ICD-10-CM

## 2021-03-25 DIAGNOSIS — M6281 Muscle weakness (generalized): Secondary | ICD-10-CM

## 2021-03-25 DIAGNOSIS — R42 Dizziness and giddiness: Secondary | ICD-10-CM

## 2021-03-25 NOTE — Therapy (Signed)
[facility] [address] [place], [place], [address] Phone: [phone]   Fax:  [phone]  Physical Therapy Treatment  Patient Details  Name: [name] MRN: [id number] Date of Birth: March 16, 1970 Referring Provider (PT): Rosemarie Ax, MD   Encounter Date: 03/25/2021   PT End of Session - 03/25/21 0853     Visit Number 5    Number of Visits 13    Date for PT Re-Evaluation 04/22/21    Authorization Type HTA (10th Visit PN)    Progress Note Due on Visit 10    PT Start Time [phone]   pt arrived late   PT Stop Time 0929    PT Time Calculation (min) 37 min    Activity Tolerance Patient tolerated treatment well    Behavior During Therapy [facility] for tasks assessed/performed             Past Medical History:  Diagnosis Date   Achilles tendinitis    ADHD (attention deficit hyperactivity disorder), inattentive type    Diagnosed as an adult after starting college   Allergy    Arthritis    Arthritis    Ataxia    Back pain    Bipolar I disorder 04/18/2014   most recent episode mixed   Borderline personality disorder 10/27/2011   Cataracts, bilateral    Chest pain    Chronic pain disorder    due to several injuries affecting numerous areas of her body throughout the years   Chronic paroxysmal hemicrania, 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   rt chest wall nodule removal   GANGLION CYST EXCISION  2011   lipoma removal     right bunioectomy     SHOULDER SURGERY  01/13/2011   right, partial tear   tumor resection left thigh      There were no vitals filed for this visit.   Subjective Assessment - 03/25/21 0853     Subjective Pt reports was sanding wood, and had two deeply embedded splinters in her finger,  and had to go to the doctor to have them removed. No falls. No other new changes/complaints.    Pertinent History Went to ED on 01/26/21. Reports that two dogs were in her yard, and as she was backing away she fell. Believed to have hit her head on the ground or  over flowerpot. Returned to ED on 01/29/21 for re-evaluation.    Limitations Standing;Walking;House hold activities    Patient Stated Goals Walk a Straight Line; Stop bumping into things, feel more balanced    Currently in Pain? No/denies              [facility] Adult PT Treatment/Exercise - 03/25/21 0001       Ambulation/Gait   Ambulation/Gait Yes    Assistive device None    Gait Pattern Within Functional Limits    Ambulation Surface Level;Indoor    Gait Comments throughout therapy gym with activities      Neuro Re-ed    Neuro Re-ed Details  Completed ambulation forwards with VOR x 1 horizontal 2 x 10', and vertical 2 x 10'. More challenge noted iwth imbalance with horizontal > vertical, but more dizziness reported with vertical > horizontal. Mild dizziness and nausea require seated rest break. Completed standing with habituation to head turns, bil stance with PT holding card positioned behind  patient, completed 2 x 10 reps to bilat directions with increased postural sway and dizziness noted. Completed ambulation with visual tracking w/ target including clockwise and counterclockwise circles 2 x 35' each.             Vestibular Treatment/Exercise - 03/25/21 0001       Vestibular Treatment/Exercise   Vestibular Treatment Provided Gaze    Gaze Exercises X1 Viewing Horizontal;X1 Viewing Vertical      X1 Viewing Horizontal   Foot Position narrow BOS on firm surface    Reps 2    Comments x 30 seconds; mild dizziness, increased postural sway      X1 Viewing Vertical   Foot Position narrow BOS on firm surface    Reps 2    Comments x 30 seconds; mild dizziness, increased postural sway              Balance Exercises - 03/25/21 0001       Balance Exercises: Standing   Standing Eyes Opened Narrow base of support (BOS);Head turns;Foam/compliant surface;Limitations    Standing Eyes Opened Limitations standing narrow BOS on airex with horizontal/vertical head turns 2 x 10 reps, CGA and intermittent touch A    Standing Eyes Closed Wide (BOA);Foam/compliant surface;3 reps;30 secs;Limitations    Standing Eyes Closed Limitations wide BOS on airex, standing EC 3 x 30 seconds    Tandem Stance Eyes open;30 secs;Limitations    Tandem Stance Time standing partial tandem with EO, completed horizontal head turns x 10 reps each,  Alternating feet position. Then completed second set with vertical head turns x 10 reps each.                  PT Short Term Goals - 03/21/21 1001       PT SHORT TERM GOAL #1   Title Patient will be independent with initial vestibular/balance HEP (All STGs Due: 03/30/21)    Baseline to be established    Time 3    Period Weeks    Status New    Target Date 03/30/21      PT SHORT TERM GOAL #2   Title FGA TBA and LTG to be set as applicable    Baseline TBA; 18/30    Time 3    Period Weeks    Status Achieved      PT SHORT TERM GOAL #3   Title  Pt will amb. 100' with horizontal head turns with minimal sway in path deviation for increased safety  with environmental scanning with amb.    Baseline increased veering noted with ambulation    Time 3    Period Weeks    Status New      PT SHORT TERM GOAL #4   Title Pt will be able to hold situation 3 of M-CTSIB for >/= 20 seconds without LOB    Baseline < 10 seconds    Time 3    Period Weeks    Status New               PT Long Term Goals - 03/21/21 1002       PT LONG TERM GOAL #1   Title Pt will improve DVA to </= 2 line difference for improved gaze stabilization. (ALL LTGs Due: 04/22/21)    Baseline 4 line difference    Time 6    Period Weeks    Status New    Target Date 04/22/21      PT LONG TERM GOAL #2   Title Pt will improve MSQ to </= 1/5 on all components to demonstrate improved activity tolernace and reduced symptoms with functional activities    Baseline 2/5    Time 6    Period Weeks    Status New      PT LONG TERM GOAL #3   Title Increase FGA score by at least 4 points to increase safety with gait.    Baseline TBA; 18/30    Time 6    Period Weeks    Status New      PT LONG TERM GOAL #4   Title Pt will be able to hold situation 4 of M-CTSIB for >/= 20 seconds without LOB    Baseline unable to complete    Time 6    Period Weeks    Status New      PT LONG TERM GOAL #5   Title Pt will be independent with final/progressive HEP for balance and vestibular exs.    Baseline no HEP established    Time 6    Period Weeks    Status New                   Plan - 03/25/21 1007     Clinical Impression Statement Continued today's skilled PT session progressing VOR x 1, continued on complaint surfaces but unable to complete without excessive postural sway. Therefore transitioned back to firm surface, but progression to narrow BOS.Continued balance exercises focused on vestibular input. Continue to require rest breaks due to dizziness/nausea, but overall  toelrating well. Will continue per POC.    Personal Factors and Comorbidities Comorbidity 3+;Time since onset of injury/illness/exacerbation    Comorbidities Bipolar Disorder, ADHD, Achilles Tendinitis, Chronic Pain, Migraines, HIstory of Multiple Concussions, HTN, Insomnia    Examination-Activity Limitations Bed Mobility;Locomotion Level;Stand;Bend    Examination-Participation Restrictions Occupation;Cleaning;Community Activity    Stability/Clinical Decision Making Stable/Uncomplicated    Rehab Potential Good    PT Frequency 2x / week    PT Duration 6 weeks    PT Treatment/Interventions ADLs/Self Care Home Management;Canalith Repostioning;Aquatic Therapy;Cryotherapy;Moist Heat;Therapeutic exercise;Stair training;Gait training;Functional mobility training;Therapeutic activities;Balance training;Neuromuscular re-education;Patient/family education;Manual techniques;Passive range of motion;Dry needling;Vestibular    PT Next Visit Plan Continue VOR/Balance Exercises; visual tracking; marching activities    Consulted and Agree with Plan of Care Patient             Patient will benefit from skilled therapeutic intervention in order to improve the following deficits and impairments:  Decreased balance, Decreased  activity tolerance, Dizziness, Decreased strength, Difficulty walking, Pain  Visit Diagnosis: Unsteadiness on feet  Dizziness and giddiness  Muscle weakness (generalized)     Problem List Patient Active Problem List   Diagnosis Date Noted   Concussion with no loss of consciousness 02/07/2021   Lumbar radiculopathy 02/07/2021   Shoulder impingement syndrome, left 11/01/2020   Sprain of metacarpophalangeal joint of left thumb 10/07/2020   Posterior tibial tendinitis of right lower extremity 09/13/2020   Labral tear of hip, degenerative 09/13/2020   Attention and concentration deficit 05/07/2020   Morbid obesity ([place]) 05/07/2020   Plantar wart 05/07/2020   Sleep apnea  05/07/2020   Urinary incontinence 88/28/0034   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   Family history of genetic disease carrier    Family history of breast cancer    Family history of colon cancer    History of multiple concussions 11/20/2017   Ataxia 11/20/2017   Insomnia 02/17/2015   Right shoulder pain 12/31/2014   Episodic cluster headache, not intractable 12/02/2014   Chronic paroxysmal hemicrania, 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   Photophobia of both eyes 05/04/2014   Nausea with vomiting 05/04/2014   Bipolar I disorder, most recent episode mixed ([place]) 04/18/2014   Suicidal ideation 04/12/2014   Injury of right shoulder and upper arm 02/17/2014   Migraine with status migrainosus 01/08/2013   Chronic migraine 05/08/2012   Hypertension    Contact dermatitis 11/27/2011   Generalized anxiety disorder 10/27/2011   ADHD (attention deficit hyperactivity disorder), 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 [place], [place], [address] Phone: [phone]   Fax:  [phone]  Name: [name] MRN: [id number] Date of Birth: Oct 02, 1970

## 2021-03-28 ENCOUNTER — Other Ambulatory Visit: Payer: Self-pay

## 2021-03-28 ENCOUNTER — Encounter (INDEPENDENT_AMBULATORY_CARE_PROVIDER_SITE_OTHER): Payer: Self-pay | Admitting: Family Medicine

## 2021-03-28 ENCOUNTER — Ambulatory Visit: Payer: PPO

## 2021-03-28 ENCOUNTER — Ambulatory Visit (INDEPENDENT_AMBULATORY_CARE_PROVIDER_SITE_OTHER): Payer: PPO | Admitting: Family Medicine

## 2021-03-28 ENCOUNTER — Encounter: Payer: Self-pay | Admitting: Adult Health

## 2021-03-28 VITALS — BP 114/76 | HR 64 | Temp 97.6°F | Ht 69.0 in | Wt 257.0 lb

## 2021-03-28 DIAGNOSIS — I10 Essential (primary) hypertension: Secondary | ICD-10-CM

## 2021-03-28 DIAGNOSIS — K5909 Other constipation: Secondary | ICD-10-CM | POA: Diagnosis not present

## 2021-03-28 DIAGNOSIS — E669 Obesity, unspecified: Secondary | ICD-10-CM | POA: Diagnosis not present

## 2021-03-28 DIAGNOSIS — Z6838 Body mass index (BMI) 38.0-38.9, adult: Secondary | ICD-10-CM | POA: Diagnosis not present

## 2021-03-28 DIAGNOSIS — E8881 Metabolic syndrome: Secondary | ICD-10-CM | POA: Diagnosis not present

## 2021-03-28 DIAGNOSIS — E559 Vitamin D deficiency, unspecified: Secondary | ICD-10-CM

## 2021-03-28 MED ORDER — DIVALPROEX SODIUM ER 500 MG PO TB24: ORAL_TABLET | ORAL | 5 refills | Status: DC

## 2021-03-28 NOTE — Telephone Encounter (Signed)
Duplicate encounter

## 2021-03-29 ENCOUNTER — Encounter: Payer: Self-pay | Admitting: Family Medicine

## 2021-03-29 ENCOUNTER — Ambulatory Visit (HOSPITAL_BASED_OUTPATIENT_CLINIC_OR_DEPARTMENT_OTHER)
Admission: RE | Admit: 2021-03-29 | Discharge: 2021-03-29 | Disposition: A | Discharge status: Home / Self Care | Payer: PPO | Source: Ambulatory Visit | Attending: Family Medicine | Admitting: Family Medicine

## 2021-03-29 ENCOUNTER — Ambulatory Visit (INDEPENDENT_AMBULATORY_CARE_PROVIDER_SITE_OTHER): Payer: PPO | Admitting: Family Medicine

## 2021-03-29 ENCOUNTER — Ambulatory Visit: Payer: Self-pay

## 2021-03-29 VITALS — BP 124/84 | Ht 69.0 in | Wt 257.0 lb

## 2021-03-29 DIAGNOSIS — S8254XA Nondisplaced fracture of medial malleolus of right tibia, initial encounter for closed fracture: Secondary | ICD-10-CM | POA: Insufficient documentation

## 2021-03-29 DIAGNOSIS — M25571 Pain in right ankle and joints of right foot: Secondary | ICD-10-CM | POA: Diagnosis not present

## 2021-03-29 DIAGNOSIS — M7552 Bursitis of left shoulder: Secondary | ICD-10-CM | POA: Insufficient documentation

## 2021-03-29 DIAGNOSIS — N760 Acute vaginitis: Secondary | ICD-10-CM | POA: Diagnosis not present

## 2021-03-29 DIAGNOSIS — S93409A Sprain of unspecified ligament of unspecified ankle, initial encounter: Secondary | ICD-10-CM | POA: Insufficient documentation

## 2021-03-29 DIAGNOSIS — Z9889 Other specified postprocedural states: Secondary | ICD-10-CM | POA: Diagnosis not present

## 2021-03-29 NOTE — Progress Notes (Signed)
[name] - 51 y.o. female MRN [id number]  Date of birth: Jun 27, 1970  SUBJECTIVE:  Including CC & ROS.  No chief complaint on file.   [name] is a 51 y.o. female that is presenting with acute right ankle pain and acute left shoulder pain following a fall.  She is having pain with weightbearing on the right ankle.  Having some pain in the medial compartment.   Review of Systems See HPI   HISTORY: Past Medical, Surgical, Social, and Family History Reviewed & Updated per EMR.   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   rt chest wall nodule removal   GANGLION CYST EXCISION  2011   lipoma removal     right bunioectomy     SHOULDER SURGERY  01/13/2011   right, partial tear   tumor resection left thigh       PHYSICAL EXAM:  VS: BP 124/84 (BP Location: Right Arm, Patient Position: Sitting)    Ht _0  (1.753 m)    Wt 257 lb (116.6 kg)    BMI 37.95 kg/m  Physical Exam Gen: NAD, alert, cooperative with exam, well-appearing MSK:  Neurovascularly intact    Limited ultrasound: Right ankle, left shoulder:  Right ankle: No ankle joint effusion. Anechoic area over the medial malleolus to suspect a possible fracture. Effusion noted within the posterior tibialis sheath.  Left shoulder: Mild bursitis overlying the supraspinatus  Summary: Right ankle concerning for medial malleolus fracture Left shoulder with bursitis  Ultrasound and interpretation by Clearance Coots, MD    ASSESSMENT & PLAN:   Closed nondisplaced fracture of medial malleolus of right tibia Injury occurred recently.  Findings on ultrasound are concerning for possible fracture.  She has  significant pain with weightbearing. -Counseled on home exercise therapy and supportive care. -Counseled on CAM Walker and crutches. -X-ray. -Follow-up in 2 weeks.  Subacromial bursitis of left shoulder joint Acutely occurring after a fall.  Nothing appears to be floor but does have changes consistent with a bursitis. -Counseled on home exercise therapy and supportive care. -Could consider injection.

## 2021-03-29 NOTE — Assessment & Plan Note (Signed)
Injury occurred recently.  Findings on ultrasound are concerning for possible fracture.  She has significant pain with weightbearing. -Counseled on home exercise therapy and supportive care. -Counseled on CAM Walker and crutches. -X-ray. -Follow-up in 2 weeks.

## 2021-03-29 NOTE — Assessment & Plan Note (Signed)
Acutely occurring after a fall.  Nothing appears to be floor but does have changes consistent with a bursitis. -Counseled on home exercise therapy and supportive care. -Could consider injection.

## 2021-03-29 NOTE — Patient Instructions (Signed)
Good to see you Please use the crutches and boot  I will call with the results from today  Please use ice as needed   Please send me a message in MyChart with any questions or updates.  Please see me back in 2 weeks.   --Dr. Raeford Razor

## 2021-03-29 NOTE — Progress Notes (Signed)
Chief Complaint:   OBESITY [name] is here to discuss her progress with her obesity treatment plan along with follow-up of her obesity related diagnoses. [name] is on the Category 1 Plan and states she is following her eating plan approximately 50% of the time. 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   Today's visit was #: 4 Starting weight: 270 lbs Starting date: 02/15/2021 Today's weight: 257 lbs Today's date: 03/28/2021 Total lbs lost to date: 13 Total lbs lost since last in-office visit: 3  Interim History: [name] had positive Flu 1.5 weeks ago, and did not eat much for 3 days or so. She is surprised she lost. Was given protein equivalents sheet last office visit because she had struggles getting in her protein/meat.  Subjective:   1. Essential hypertension [name] is taking Toprol XL, and is tolerating medication(s) well without side effects.  Medication compliance is good and patient appears to be taking it as prescribed.  She doesn't check her blood pressure at home.The patient denies additional concerns regarding this condition.   2. Insulin resistance [name] is not on medications and she denies carbohydrate cravings.  3. Vitamin D deficiency [name] is taking OTC Vit D 5,000 IU daily. [name] is tolerating medication(s) well without side effects.  Medication compliance is good and patient appears to be taking it as prescribed.  The patient denies additional concerns regarding this condition.   4. Other constipation Kinslee is taking Colace 2-3 day per week. Her symptoms are well controlled. No need for miralax at all. Drinking 50 oz or so daily of water only.   Assessment/Plan:  No orders of the defined types were placed in this encounter.   There are no discontinued medications.   No orders of the defined types were placed in this encounter.    1. Essential hypertension [name]'s blood pressure is at goal today, been well controlled since being here. She  will decrease her salt and increase her water.  2. Insulin resistance [name] will continue to work on weight loss, exercise, and decreasing simple carbohydrates to help decrease the risk of diabetes. [name] agreed to follow-up with [place] as directed to closely monitor her progress.  3. Vitamin D deficiency Last Vit D level was at goal at 49.4 1 month ago. Lorriane will continue her Vitamin D supplementations.  4. Other constipation [name]'s symptoms are controlled, and she will continue her current treatment plan.  5. Obesity with current BMI of 38.0 [name] is currently in the action stage of change. As such, her goal is to continue with weight loss efforts. She has agreed to the Category 1 Plan.   Exercise goals: All adults should avoid inactivity. Some physical activity is better than none, and adults who participate in any amount of physical activity gain some health benefits. Start walking for 10 minutes daily to help with mood, energy levels, blood pressure, insulin resistance, etc.   Behavioral modification strategies: increasing lean protein intake, decreasing simple carbohydrates, increasing water intake, and planning for success.  [name] has agreed to follow-up with our clinic in 2 weeks with Joyce Potash, PA-C, then myself. She was informed of the importance of frequent follow-up visits to maximize her success with intensive lifestyle modifications for her multiple health conditions.   Objective:   Blood pressure 114/76, pulse 64, temperature 97.6 F (36.4 C), height 5\' 9"  (1.753 m), weight 257 lb (116.6 kg), SpO2 98 %. Body mass index is 37.95 kg/m.  General: Cooperative, alert, well developed,  in no acute distress. HEENT: Conjunctivae and lids unremarkable. Cardiovascular: Regular rhythm.  Lungs: Normal work of breathing. Neurologic: No focal deficits.   Lab Results  Component Value Date   CREATININE 0.88 02/15/2021   BUN 8 02/15/2021   NA 138 02/15/2021   K 4.0  02/15/2021   CL 99 02/15/2021   CO2 25 02/15/2021   Lab Results  Component Value Date   ALT 14 02/15/2021   AST 15 02/15/2021   ALKPHOS 71 02/15/2021   BILITOT 0.5 02/15/2021   Lab Results  Component Value Date   HGBA1C 5.1 02/15/2021   HGBA1C 5.1 12/29/2016   HGBA1C 5.1 07/19/2016   HGBA1C 5.4 09/25/2014   HGBA1C 5.3 04/14/2014   Lab Results  Component Value Date   INSULIN 9.3 02/15/2021   Lab Results  Component Value Date   TSH 2.660 02/15/2021   Lab Results  Component Value Date   CHOL 220 (H) 02/15/2021   HDL 57 02/15/2021   LDLCALC 136 (H) 02/15/2021   TRIG 151 (H) 02/15/2021   CHOLHDL 3.7 12/29/2016   Lab Results  Component Value Date   VD25OH 49.4 02/15/2021   Lab Results  Component Value Date   WBC 8.6 02/15/2021   HGB 14.7 02/15/2021   HCT 43.8 02/15/2021   MCV 91 02/15/2021   PLT 260 02/15/2021   Lab Results  Component Value Date   IRON 111 07/19/2020   TIBC 286 07/19/2020   FERRITIN 63 07/19/2020    Obesity Behavioral Intervention:   Approximately 15 minutes were spent on the discussion below.  ASK: We discussed the diagnosis of obesity with [name] today and Davian agreed to give [place] permission to discuss obesity behavioral modification therapy today.  ASSESS: [name] has the diagnosis of obesity and her BMI today is 38.0. [name] is in the action stage of change.   ADVISE: [name] was educated on the multiple health risks of obesity as well as the benefit of weight loss to improve her health. She was advised of the need for long term treatment and the importance of lifestyle modifications to improve her current health and to decrease her risk of future health problems.  AGREE: Multiple dietary modification options and treatment options were discussed and [name] agreed to follow the recommendations documented in the above note.  ARRANGE: [name] was educated on the importance of frequent visits to treat obesity as outlined per CMS and  USPSTF guidelines and agreed to schedule her next follow up appointment today.  Attestation Statements:   Reviewed by clinician on day of visit: allergies, medications, problem list, medical history, surgical history, family history, social history, and previous encounter notes.   Wilhemena Durie, am acting as transcriptionist for [employer], DO.  I have reviewed the above documentation for accuracy and completeness, and I agree with the above. Marjory Sneddon, D.O.  The [place] was signed into law in 2016 which includes the topic of electronic health records.  This provides immediate access to information in MyChart.  This includes consultation notes, operative notes, office notes, lab results and pathology reports.  If you have any questions about what you read please let us know at your next visit so we can discuss your concerns and take corrective action if need be.  We are right here with you.

## 2021-03-30 ENCOUNTER — Telehealth: Payer: Self-pay | Admitting: Family Medicine

## 2021-03-30 NOTE — Telephone Encounter (Signed)
Informed of results.  ? ?Rosemarie Ax, MD ?[facility] ?03/30/2021, 2:05 PM ? ?

## 2021-03-30 NOTE — Telephone Encounter (Signed)
Patient called says she can see her Xray report but is waiting on a call from provider regard results  & what her work states should be. ? ?Forwarding message to provider. ? ?--glh ?

## 2021-03-31 ENCOUNTER — Ambulatory Visit: Payer: PPO

## 2021-04-04 ENCOUNTER — Ambulatory Visit: Payer: PPO

## 2021-04-07 ENCOUNTER — Ambulatory Visit: Payer: PPO | Attending: Family Medicine | Admitting: Physical Therapy

## 2021-04-07 ENCOUNTER — Other Ambulatory Visit: Payer: Self-pay

## 2021-04-07 ENCOUNTER — Encounter: Payer: Self-pay | Admitting: Physical Therapy

## 2021-04-07 DIAGNOSIS — R2681 Unsteadiness on feet: Secondary | ICD-10-CM | POA: Insufficient documentation

## 2021-04-07 DIAGNOSIS — R42 Dizziness and giddiness: Secondary | ICD-10-CM | POA: Insufficient documentation

## 2021-04-07 DIAGNOSIS — M6281 Muscle weakness (generalized): Secondary | ICD-10-CM | POA: Insufficient documentation

## 2021-04-07 NOTE — Therapy (Deleted)
.  oprc 

## 2021-04-07 NOTE — Therapy (Signed)
OUTPATIENT PHYSICAL THERAPY TREATMENT NOTE   Patient Name: [name] MRN: [id number] DOB:[birth year], 51 y.o., female Today's Date: 04/08/2021  PCP: Aretta Nip, MD REFERRING PROVIDER: Aretta Nip, MD   PT End of Session - 04/07/21 1013     Visit Number 6    Number of Visits 13    Date for PT Re-Evaluation 04/22/21    Authorization Type HTA (10th Visit PN)    Progress Note Due on Visit 10    PT Start Time 0930    PT Stop Time 1010    PT Time Calculation (min) 40 min    Activity Tolerance Patient tolerated treatment well    Behavior During Therapy WFL for tasks assessed/performed             Past Medical History:  Diagnosis Date   Achilles tendinitis    ADHD (attention deficit hyperactivity disorder), inattentive type    Diagnosed as an adult after starting college   Allergy    Arthritis    Arthritis    Ataxia    Back pain    Bipolar I disorder 04/18/2014   most recent episode mixed   Borderline personality disorder 10/27/2011   Cataracts, bilateral    Chest pain    Chronic pain disorder    due to several injuries affecting numerous areas of her body throughout the years   Chronic paroxysmal hemicrania, 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   rt chest wall nodule removal   GANGLION CYST EXCISION  2011   lipoma removal     right bunioectomy     SHOULDER SURGERY  01/13/2011   right, partial tear   tumor resection left thigh     Patient Active Problem List   Diagnosis Date Noted   Closed nondisplaced fracture of medial malleolus of right tibia 03/29/2021   Subacromial bursitis of left shoulder joint 03/29/2021   Concussion with no loss of consciousness 02/07/2021   Lumbar radiculopathy 02/07/2021   Shoulder impingement syndrome, left 11/01/2020   Sprain of metacarpophalangeal joint of left thumb 10/07/2020   Posterior tibial tendinitis of right lower extremity 09/13/2020   Labral tear of hip, degenerative 09/13/2020   Attention and concentration deficit 05/07/2020   Morbid obesity ([place]) 05/07/2020   Plantar wart 05/07/2020   Sleep apnea 05/07/2020   Urinary incontinence 29/24/4628   Monoallelic mutation of  CHEK2 gene in female patient 02/27/2018   Family history of genetic disease carrier    Family history of breast cancer    Family history of colon cancer    History of multiple concussions 11/20/2017   Ataxia 11/20/2017   Insomnia 02/17/2015   Right shoulder pain 12/31/2014   Episodic cluster headache, not intractable 12/02/2014   Chronic paroxysmal hemicrania, 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   Photophobia of both eyes 05/04/2014   Nausea with vomiting 05/04/2014   Bipolar I disorder, most recent episode mixed ([place]) 04/18/2014   Suicidal ideation 04/12/2014   Injury of right shoulder and upper arm 02/17/2014   Migraine with status migrainosus 01/08/2013   Chronic migraine 05/08/2012   Hypertension    Contact dermatitis 11/27/2011   Generalized  anxiety disorder 10/27/2011   ADHD (attention deficit hyperactivity disorder), 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   Borderline personality disorder ([place]) 10/27/2011   Right foot pain 09/28/2011   Loss of transverse plantar arch 09/01/2011   Malignant tumor of muscle ([place]) 09/02/2010   Ganglion cyst 09/29/2009   Pes planus 07/01/2008    REFERRING DIAG: Post concussion syndrome  THERAPY DIAG:  Unsteadiness on feet  Dizziness and giddiness  PERTINENT HISTORY: Bipolar Disorder, ADHD, Achilles Tendinitis, Chronic Pain, Migraines, HIstory of Multiple Concussions, HTN, Insomnia   PRECAUTIONS: Fall  SUBJECTIVE: Pt reports she sprained her Rt ankle 2 weeks ago - thought it was fractured but it was not; missed a week of work - was told to wear a boot and use crutches; stated she pretty much rested and stayed off of it the entire week last week.  Pt reports she hasn't had a lot of dizziness because she hasn't been standing up a lot; is scheduled to work today so it may increase  PAIN:  Are you having pain? Yes NPRS scale: 5/10 Pain location: head  Pain orientation: Other: entire head - thinks it may be start of migraine   PAIN TYPE: aching Pain description: intermittent  Aggravating factors: unknown Relieving factors: unknown     TODAY'S TREATMENT:  Standing Balance: Surface: Airex and Pillows Position: Feet Hip Width Apart Completed with: Eyes Open and Eyes Closed; Head Turns x 5 Reps and Head Nods x 5 Reps   Pt amb. 115' with intermittent head turns horizontally - pt had veering and unsteadiness but was able to independently recover LOB  Marching 10 reps on Airex with UE support prn with EO - no head turns added  Rockerboard performed inside // bars - with bil UE support, progressing to 1 UE support, then to only UE prn for recovery of LOB- rocking anteriorly/posteriorly 10 reps with EO and then with EC; holding board steady - head turns horizontally 5 reps, then vertical 5 reps;   performed stepping down from rockerboard 5 reps each leg with head turn to opposite side - with UE support on // bar prn  Squats on Airex 3 reps EO; then 5 reps EC with min. UE support on // bars  Pt held situation 3 on MCTSIB 14 secs prior to LOB  Pt stood on mini trampoline - visually tracking red medicine ball clockwise 5 reps, then 5 reps counterclockwise - pt reported mild nausea after this activity        PATIENT EDUCATION: Education details: HEP reviewed Person educated: Patient Education method: Explanation Education comprehension: verbalized understanding   HOME EXERCISE PROGRAM: No new exs added to HEP   PT Short Term Goals - 04/08/21 8756  PT SHORT TERM GOAL #1   Title Patient will be independent with initial vestibular/balance HEP (All STGs Due: 03/30/21)    Baseline to be established    Time 3    Period Weeks    Status Achieved    Target Date 03/30/21      PT SHORT TERM GOAL #2   Title FGA TBA and LTG to be set as applicable    Baseline TBA; 18/30    Time 3    Period Weeks    Status Achieved      PT SHORT TERM GOAL #3   Title Pt will amb. 100' with horizontal head turns with minimal sway in path deviation for increased safety with environmental scanning with amb.    Baseline increased veering noted with ambulation; continued veering & unsteadiness with amb. with head turns - 04-07-21    Time 3    Period Weeks    Status Not Met      PT SHORT TERM GOAL #4   Title Pt will be able to hold situation 3 of M-CTSIB for >/= 20 seconds without LOB    Baseline < 10 seconds; 14 secs on 04-07-21    Time 3    Period Weeks    Status Not Met              PT Long Term Goals - 04/08/21 0936       PT LONG TERM GOAL #1   Title Pt will improve DVA to </= 2 line difference for improved gaze stabilization. (ALL LTGs Due: 04/22/21)    Baseline 4 line difference    Time 6    Period Weeks    Status New    Target Date 04/22/21      PT LONG TERM GOAL #2   Title  Pt will improve MSQ to </= 1/5 on all components to demonstrate improved activity tolernace and reduced symptoms with functional activities    Baseline 2/5    Time 6    Period Weeks    Status New      PT LONG TERM GOAL #3   Title Increase FGA score by at least 4 points to increase safety with gait.    Baseline TBA; 18/30    Time 6    Period Weeks    Status New      PT LONG TERM GOAL #4   Title Pt will be able to hold situation 4 of M-CTSIB for >/= 20 seconds without LOB    Baseline unable to complete    Time 6    Period Weeks    Status New      PT LONG TERM GOAL #5   Title Pt will be independent with final/progressive HEP for balance and vestibular exs.    Baseline no HEP established    Time 6    Period Weeks    Status New              Plan - 04/07/21 1013     Clinical Impression Statement Pt has met STG's #1 & 2 as FGA was previously assessed and pt's score 18/30;  Pt did not meet STG #3 as continued veering & unsteadiness with amb. with head turns and did not meet STG #4 as pt able to hold situation 3 on  MCTSIB for 14 secs, 20 secs per stated goal.  Pt returns to PT today after cancellations for 2 weeks due to ankle sprain.  Pt c/o mild nausea after performing  vestibular exercises in today's session.  Cont with POC.    Personal Factors and Comorbidities Comorbidity 3+;Time since onset of injury/illness/exacerbation    Comorbidities Bipolar Disorder, ADHD, Achilles Tendinitis, Chronic Pain, Migraines, HIstory of Multiple Concussions, HTN, Insomnia    Examination-Activity Limitations Bed Mobility;Locomotion Level;Stand;Bend    Examination-Participation Restrictions Occupation;Cleaning;Community Activity    Stability/Clinical Decision Making Stable/Uncomplicated    Rehab Potential Good    PT Frequency 2x / week    PT Duration 6 weeks    PT Treatment/Interventions ADLs/Self Care Home Management;Canalith Repostioning;Aquatic Therapy;Cryotherapy;Moist Heat;Therapeutic  exercise;Stair training;Gait training;Functional mobility training;Therapeutic activities;Balance training;Neuromuscular re-education;Patient/family education;Manual techniques;Passive range of motion;Dry needling;Vestibular    PT Next Visit Plan Continue VOR/Balance Exercises; visual tracking; marching activities    Consulted and Agree with Plan of Care Patient               Alda Lea, PT 04/08/2021, 9:52 AM

## 2021-04-08 ENCOUNTER — Encounter: Payer: Self-pay | Admitting: Physical Therapy

## 2021-04-09 DIAGNOSIS — M25552 Pain in left hip: Secondary | ICD-10-CM | POA: Diagnosis not present

## 2021-04-09 DIAGNOSIS — M542 Cervicalgia: Secondary | ICD-10-CM | POA: Diagnosis not present

## 2021-04-09 DIAGNOSIS — M25512 Pain in left shoulder: Secondary | ICD-10-CM | POA: Diagnosis not present

## 2021-04-11 ENCOUNTER — Encounter: Payer: Self-pay | Admitting: Physical Therapy

## 2021-04-11 ENCOUNTER — Ambulatory Visit (INDEPENDENT_AMBULATORY_CARE_PROVIDER_SITE_OTHER): Payer: PPO | Admitting: Physician Assistant

## 2021-04-11 ENCOUNTER — Other Ambulatory Visit: Payer: Self-pay

## 2021-04-11 ENCOUNTER — Encounter (INDEPENDENT_AMBULATORY_CARE_PROVIDER_SITE_OTHER): Payer: Self-pay | Admitting: Physician Assistant

## 2021-04-11 ENCOUNTER — Ambulatory Visit: Payer: PPO | Admitting: Physical Therapy

## 2021-04-11 VITALS — BP 128/80 | HR 70 | Temp 97.9°F | Ht 69.0 in | Wt 257.0 lb

## 2021-04-11 DIAGNOSIS — E669 Obesity, unspecified: Secondary | ICD-10-CM | POA: Diagnosis not present

## 2021-04-11 DIAGNOSIS — R42 Dizziness and giddiness: Secondary | ICD-10-CM

## 2021-04-11 DIAGNOSIS — R2681 Unsteadiness on feet: Secondary | ICD-10-CM

## 2021-04-11 DIAGNOSIS — Z6837 Body mass index (BMI) 37.0-37.9, adult: Secondary | ICD-10-CM | POA: Diagnosis not present

## 2021-04-11 DIAGNOSIS — Z6841 Body Mass Index (BMI) 40.0 and over, adult: Secondary | ICD-10-CM

## 2021-04-11 DIAGNOSIS — E7849 Other hyperlipidemia: Secondary | ICD-10-CM | POA: Diagnosis not present
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
## 2021-04-12 ENCOUNTER — Ambulatory Visit (INDEPENDENT_AMBULATORY_CARE_PROVIDER_SITE_OTHER): Payer: PPO | Admitting: Family Medicine

## 2021-04-12 ENCOUNTER — Encounter: Payer: Self-pay | Admitting: Family Medicine

## 2021-04-12 DIAGNOSIS — M7552 Bursitis of left shoulder: Secondary | ICD-10-CM

## 2021-04-12 DIAGNOSIS — S161XXA Strain of muscle, fascia and tendon at neck level, initial encounter: Secondary | ICD-10-CM

## 2021-04-12 DIAGNOSIS — S93421D Sprain of deltoid ligament of right ankle, subsequent encounter: Secondary | ICD-10-CM | POA: Diagnosis not present

## 2021-04-12 NOTE — Assessment & Plan Note (Signed)
Imaging was negative for fracture.  She has been able to ambulate without using a cam walker and just using an Ace wrap. ?-Counseled on home exercise therapy and supportive care. ?-Physical therapy can help treat. ?-Could consider further imaging. ?
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
## 2021-04-13 DIAGNOSIS — M542 Cervicalgia: Secondary | ICD-10-CM | POA: Diagnosis not present

## 2021-04-13 DIAGNOSIS — M47812 Spondylosis without myelopathy or radiculopathy, cervical region: Secondary | ICD-10-CM | POA: Diagnosis not present

## 2021-04-13 DIAGNOSIS — M5451 Vertebrogenic low back pain: Secondary | ICD-10-CM | POA: Diagnosis not present

## 2021-04-13 DIAGNOSIS — M545 Low back pain, unspecified: Secondary | ICD-10-CM | POA: Diagnosis not present

## 2021-04-13 DIAGNOSIS — M47816 Spondylosis without myelopathy or radiculopathy, lumbar region: Secondary | ICD-10-CM | POA: Diagnosis not present

## 2021-04-14 ENCOUNTER — Other Ambulatory Visit: Payer: Self-pay

## 2021-04-14 ENCOUNTER — Ambulatory Visit: Payer: PPO | Admitting: Physical Therapy

## 2021-04-14 DIAGNOSIS — R2681 Unsteadiness on feet: Secondary | ICD-10-CM | POA: Diagnosis not present

## 2021-04-14 NOTE — Therapy (Signed)
?OUTPATIENT PHYSICAL THERAPY TREATMENT NOTE ? ? ?Patient Name: [name] ?MRN: [id number] ?DOB:1970-07-29, 51 y.o., female ?Today's Date: 04/14/2021 ? ?PCP: Aretta Nip, MD ?REFERRING PROVIDER: Aretta Nip, MD ? ? PT End of Session - 04/14/21 1847   ? ? Visit Number 8   ? Number of Visits 13   ? Date for PT Re-Evaluation 04/22/21   ? Authorization Type HTA (10th Visit PN)   ? Progress Note Due on Visit 10   ? PT Start Time 1016   ? PT Stop Time 1100   ? PT Time Calculation (min) 44 min   ? Activity Tolerance Patient tolerated treatment well   ? Behavior During Therapy [facility] for tasks assessed/performed   ? ?  ?  ? ?  ? ? ? ?Past Medical History:  ?Diagnosis Date  ? Achilles tendinitis   ? ADHD (attention deficit hyperactivity disorder), inattentive type   ? Diagnosed as an adult after starting college  ? Allergy   ? Arthritis   ? Arthritis   ? Ataxia   ? Back pain   ? Bipolar I disorder 04/18/2014  ? most recent episode mixed  ? Borderline personality disorder 10/27/2011  ? Cataracts, bilateral   ? Chest pain   ? Chronic pain disorder   ? due to several injuries affecting numerous areas of her body throughout the years  ? 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 Dizziness   ? Eczema   ? Episodic cluster headache, not intractable 12/02/2014  ? Family history of genetic disease carrier   ? Ganglion cyst 09/29/2009  ? left wrist (2 cyst)  ? Generalized anxiety disorder 10/27/2011  ? History of multiple concussions   ? October 2018 and September 2019  ? Hyperprolactinemia   ? Hypertension   ? Insomnia 02/17/2015  ? Joint pain   ? Lipoma   ? 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 Migraine with status migrainosus 01/08/2013  ? 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 CHEK2 [phone] (Intronic)  ? Other fatigue   ? Pes planus   ? Photophobia of both  eyes 05/04/2014  ? Sciatica   ? Seasonal allergies   ? Shortness of breath   ? Shortness of breath on exertion   ? Shoulder pain   ? ?Past Surgical History:  ?Procedure Laterality Date  ? ANKLE SURGERY  12/88  ? left   ? chest nodule  1990?  ? rt chest wall nodule removal  ? GANGLION CYST EXCISION  2011  ? lipoma removal    ? right bunioectomy    ? SHOULDER SURGERY  01/13/2011  ? right, partial tear  ? tumor resection left thigh    ? ?Patient Active Problem List  ? Diagnosis Date Noted  ? Sprain of ankle 03/29/2021  ? Subacromial bursitis of left shoulder joint 03/29/2021  ? Concussion with no loss of consciousness 02/07/2021  ? Lumbar radiculopathy 02/07/2021  ? Shoulder impingement syndrome, left 11/01/2020  ? Sprain of metacarpophalangeal joint of left thumb 10/07/2020  ? Posterior tibial tendinitis of right lower extremity 09/13/2020  ? Labral tear of hip, degenerative 09/13/2020  ? Attention and concentration deficit 05/07/2020  ? Morbid obesity ([place]) 05/07/2020  ? Plantar wart 05/07/2020  ? Sleep apnea 05/07/2020  ? Urinary incontinence 05/07/2020  ? Monoallelic mutation of CHEK2 gene in female patient  02/27/2018  ? Family history of genetic disease carrier   ? Family history of breast cancer   ? Family history of colon cancer   ? History of multiple concussions 11/20/2017  ? Ataxia 11/20/2017  ? Cervical strain, acute, initial encounter 09/19/2016  ? Insomnia 02/17/2015  ? Right shoulder pain 12/31/2014  ? Episodic cluster headache, not intractable 12/02/2014  ? 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 Photophobia of both eyes 05/04/2014  ? Nausea with vomiting 05/04/2014  ? Bipolar I disorder, most recent episode mixed ([place]) 04/18/2014  ? Suicidal ideation 04/12/2014  ? Injury of right shoulder and upper arm 02/17/2014  ? Migraine with status migrainosus 01/08/2013  ? Chronic migraine 05/08/2012  ? Hypertension   ? Contact dermatitis 11/27/2011  ?  Generalized anxiety disorder 10/27/2011  ? ADHD (attention deficit hyperactivity disorder), 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  ? Borderline personality disorder ([place]) 10/27/2011  ? Right foot pain 09/28/2011  ? Loss of transverse plantar arch 09/01/2011  ? Malignant tumor of muscle ([place]) 09/02/2010  ? Ganglion cyst 09/29/2009  ? Pes planus 07/01/2008  ? ? ?REFERRING DIAG: Post concussion syndrome ? ?THERAPY DIAG:  ?Unsteadiness on feet ? ?Dizziness and giddiness ? ?PERTINENT HISTORY: Bipolar Disorder, ADHD, Achilles Tendinitis, Chronic Pain, Migraines, HIstory of Multiple Concussions, HTN, Insomnia  ? ?PRECAUTIONS: Fall ? ?SUBJECTIVE: Pt reports she saw her "spine doctor" earlier this week and was diagnosed with whiplash; got a shot of Toridol which helped; states he ordered PT - trying to decide when to start this; will prob. Go to [employer]. For this service ?  ?PAIN:  ?Are you having pain? Yes ?NPRS scale: 4/10 ?Pain location: Lt side of neck and in Lt shoulder ?Pain orientation: sore ?PAIN TYPE: aching, dull ?Pain description: intermittent - constant ?Aggravating factors: unknown ?Relieving factors: unknown - pt reports PT has been ordered ? ? ? ?TODAY'S TREATMENT:  ?04-14-21: ?Standing Balance: ?Surface: Pillows ?Position: Feet Hip Width Apart ?Completed with: Eyes Open and Eyes Closed; Head Turns x 5 Reps and Head Nods x 5 Reps ? ?Pt performed marching on floor with EO 10 reps; marching with EO with horizontal head turns 5 reps, then vertical head turns 5 reps ?Progressed to marching on pillows EO 10 reps, EC 10 reps; marching with EO with horizontal head turns 5 reps, vertical head turns 5 reps; Marching with EC on pillows 10 reps (NO HEAD TURNS) with this activity ? ?Pt performed standing on Bosu (inverted) inside // bars - performed anterior/posterior weight shifts 10 reps each with bil. UE support ?Performed standing perpendicular on blue balance beam - head turns side to side and then up/down 5 reps each with  EO; progressed to standing in partial tandem stance EO with UE support prn on // bars ? ?Pt stood on Airex inside // bars - made circles with red medicine ball for visual tracking - clockwise and counterclockwise 5 reps each:  "V" pattern 5 reps - for  ? ?Pt sat on blue physioball - performed gentle bouncing up/down 10 reps EO and then 10 reps with EC; added head turns 10 reps side to side and then vertical 10 reps with bouncing - pt reported dizziness 2/10 intensity after performing this exercise ?  ?Pt performed walking 31' with intermittent horizontal head turns with SBA ? ? ? ?04-11-21:  ?NMR:  Standing Balance: ?Surface: Pillows ?Position: Feet Hip Width Apart ?Completed with: Eyes Open and Eyes Closed; Head Turns x 5 Reps  and Head Nods x 5 Reps ? ?Rockerboard inside // bars with minimal UE support - 10 reps EO without UE support; 10 reps EC with 2 finger support on // bars; ?Holding rockerboard steady - head turns 5 reps horizontally and head turns 5 reps vertically ?Rocking forward stepping down to floor with Rt foot, looking left - 5 reps ?Rocking back - stepping down to floor with Lt foot, looking Right - 5 reps  ? ? Standing in corner - rotating Lt to Rt with EO 3 reps; EC 5 reps  ? Squats EO 3 reps and then EC 5 reps  ? ?Standing on mini trampoline making circles clockwise and counterclockwise 5 reps each direcion with red medicine ball for improved visual tracking and gaze stabilization ? ?Gaze stabilization ex:  standing on floor - 30 secs horizontal x 1 rep;  30 secs vertical x 1 rep  ? ?Pt performed walking 115' with intermittent horizontal head turns with SBA ? ?Performed abrupt stop and turn 2 reps - amb. 20' , stop and quick turn; pt had no LOB with this activity ?  ? ? ? ? ?04-07-21 ? ?Standing Balance: ?Surface: Airex and Pillows ?Position: Feet Hip Width Apart ?Completed with: Eyes Open and Eyes Closed; Head Turns x 5 Reps and Head Nods x 5 Reps ? ? ?Pt amb. 115' with intermittent head turns  horizontally - pt had veering and unsteadiness but was able to independently recover LOB ? ?Marching 10 reps on Airex with UE support prn with EO - no head turns added ? ?Rockerboard performed inside // bars -

## 2021-04-18 ENCOUNTER — Other Ambulatory Visit: Payer: Self-pay

## 2021-04-18 ENCOUNTER — Ambulatory Visit: Payer: PPO

## 2021-04-18 DIAGNOSIS — R42 Dizziness and giddiness: Secondary | ICD-10-CM

## 2021-04-18 DIAGNOSIS — R2681 Unsteadiness on feet: Secondary | ICD-10-CM

## 2021-04-18 DIAGNOSIS — M6281 Muscle weakness (generalized): Secondary | ICD-10-CM
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
## 2021-04-21 ENCOUNTER — Other Ambulatory Visit: Payer: Self-pay

## 2021-04-21 ENCOUNTER — Ambulatory Visit: Payer: PPO | Admitting: Physical Therapy

## 2021-04-21 DIAGNOSIS — R42 Dizziness and giddiness: Secondary | ICD-10-CM

## 2021-04-21 DIAGNOSIS — R2681 Unsteadiness on feet: Secondary | ICD-10-CM
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
## 2021-04-25 ENCOUNTER — Encounter (INDEPENDENT_AMBULATORY_CARE_PROVIDER_SITE_OTHER): Payer: Self-pay | Admitting: Family Medicine

## 2021-04-25 ENCOUNTER — Other Ambulatory Visit: Payer: Self-pay

## 2021-04-25 ENCOUNTER — Ambulatory Visit (INDEPENDENT_AMBULATORY_CARE_PROVIDER_SITE_OTHER): Payer: PPO | Admitting: Family Medicine

## 2021-04-25 VITALS — BP 134/86 | HR 65 | Temp 98.0°F | Ht 69.0 in | Wt 253.0 lb

## 2021-04-25 DIAGNOSIS — K5909 Other constipation: Secondary | ICD-10-CM

## 2021-04-25 DIAGNOSIS — G479 Sleep disorder, unspecified: Secondary | ICD-10-CM | POA: Diagnosis not present

## 2021-04-25 DIAGNOSIS — E669 Obesity, unspecified: Secondary | ICD-10-CM | POA: Diagnosis not present

## 2021-04-25 DIAGNOSIS — I10 Essential (primary) hypertension: Secondary | ICD-10-CM | POA: Diagnosis not present

## 2021-04-25 DIAGNOSIS — F39 Unspecified mood [affective] disorder: Secondary | ICD-10-CM

## 2021-04-25 DIAGNOSIS — Z6837 Body mass index (BMI) 37.0-37.9, adult: Secondary | ICD-10-CM

## 2021-04-25 DIAGNOSIS — Z9189 Other specified personal risk factors, not elsewhere classified: Secondary | ICD-10-CM

## 2021-04-25 MED ORDER — TRAZODONE HCL 100 MG PO TABS: 100.0000 mg | ORAL_TABLET | Freq: Every day | ORAL | 0 refills | Status: AC

## 2021-04-25 MED ORDER — LURASIDONE HCL 80 MG PO TABS: 80.0000 mg | ORAL_TABLET | Freq: Every day | ORAL | 0 refills | Status: AC

## 2021-04-27 NOTE — Progress Notes (Signed)
? ? ? ?Chief Complaint:  ? ?OBESITY ?[name] is here to discuss her progress with her obesity treatment plan along with follow-up of her obesity related diagnoses. [name] is on the Category 1 Plan and keeping a food journal and adhering to recommended goals of 400-500 calories and 35 grams of protein at supper daily and states she is following her eating plan approximately 60% of the time. [name] states she is doing 0 minutes 0 times per week. ? ?Today's visit was #: 6 ?Starting weight: 270 lbs ?Starting date: 02/15/2021 ?Today's weight: 253 lbs ?Today's date: 04/25/2021 ?Total lbs lost to date: 84 ?Total lbs lost since last in-office visit: 4 ? ?Interim History: [name] does well with breakfast and lunch daily. She struggles with dinner and she still eats out 3 days per week or so. She does well with staying at 100 calories for her snacks.  ? ?Subjective:  ? ?1. Sleep difficulties ?[name] has been out of trazodone for 4 days. Psych doc can't refill until she has an appointment with them but she can't get an appt for 3wks or so!  She requests RF's of these meds to tide her over ? ?2. Other constipation ?[name]'s symptoms are well controlled only using Colace 2 days per week or so. She is regular and has no concerns otherwise. Taking in 34-50 oz of water per day. ? ?3. Essential hypertension ?[name] is tolerating medication(s) well without side effects.  Medication compliance is good and patient appears to be taking it as prescribed.  The patient denies additional concerns regarding this condition. She is taking metoprolol. ? ?4. Mood disorder ([place]) ?[name] has been out of [place] for 4 days.  Been on it for years and at the same dose for 2+ years.  She requests a refill today because she cant get in to see her psych doc for 2-3 weeks.  Feels herself starting to struggle without it and per pt, it is NOT GOOD AT ALL when she misses many doses.  ? ?5. At risk for impaired psychological state ?[name] is at risk due  to being out of her medications given to her by psychiatry provider for over 4 days ? ? ?Assessment/Plan:  ? ?Medications Discontinued During This Encounter  ?Medication Reason  ? lurasidone (LATUDA) 80 MG TABS tablet Reorder  ? traZODone (DESYREL) 100 MG tablet Reorder  ?  ? ?Meds ordered this encounter  ?Medications  ? lurasidone (LATUDA) 80 MG TABS tablet  ?  Sig: Take 1 tablet (80 mg total) by mouth daily with supper. (Along with '20mg'$  in AM for mood)  ?  Dispense:  30 tablet  ?  Refill:  0  ?  Only 1 time RF---> further refills per Ms Ronnald Ramp of psychiatry  ? traZODone (DESYREL) 100 MG tablet  ?  Sig: Take 1 tablet (100 mg total) by mouth at bedtime.  ?  Dispense:  30 tablet  ?  Refill:  0  ?  Only 1 time RF---> further refills per Ms Ronnald Ramp of psychiatry  ?  ? ?1. Sleep difficulties ?We will refill trazodone for 1 month b/c pt has run out and per pt, sleep is very important to ensuring her mood stays stable. Refills in the future by psych doc/provider.  ?- [name] will increase walking/activity during the daytime.  ?- Extensive discussion with patient regarding blue light, avoid caffeine after 3 pm, sleep hygiene, black out blinds, etc. ?- discussed proper sleep hygiene and importance of recreating these habits each  and every night ?- encouraged to increase activity/ exercise and avoid sleeping during the day ? ?- traZODone (DESYREL) 100 MG tablet; Take 1 tablet (100 mg total) by mouth at bedtime.  Dispense: 30 tablet; Refill: 0 ? ? ?2. Other constipation ?[name] was informed that a decrease in bowel movement frequency is normal while losing weight, but stools should not be hard or painful. Orders and follow up as documented in patient record.  ?Counseling ?Getting to Good Bowel Health: Your goal is to have one soft bowel movement each day. Drink at least 8 glasses of water each day. Eat plenty of fiber (goal is over 25 grams each day). It is best to get most of your fiber from dietary sources which includes leafy  green vegetables, fresh fruit, and whole grains. You may need to add fiber with the help of OTC fiber supplements. These include Metamucil, Citrucel, and Flaxseed. If you are still having trouble, try adding Miralax or Magnesium Citrate. If all of these changes do not work, Cabin crew. ? ? ?3. Essential hypertension ?[name]'s blood pressure is high normal today for her (usually 110's/70's), but no need for dose change today. Decrease salt, increase water intake, start walking, continue weight loss. ? ? ?4. Mood disorder ([place]) ?We will refill Latuda ONLY for 1 month since pt is out * 4 d now and feels herself slipping, further refills per psych doc. Start exercising! ? ?- lurasidone (LATUDA) 80 MG TABS tablet; Take 1 tablet (80 mg total) by mouth daily with supper. (Along with '20mg'$  in AM for mood)  Dispense: 30 tablet; Refill: 0 ? ?5. At risk for impaired psychological state ?[name] was given approximately 9+ minutes of depression prevention counseling today due to their higher than average risk for this condition. The patient has several risk factors for depression such as chronic medical conditions, sleep issues, major life stressors/events, etc., and we discussed these today.  [name] was also counseled on the importance of a healthy work-life balance, a healthy relationship with food, and a good support system.  I recommended counseling, meditation or prayer, healthy eating habits, sleep hygiene, and exercising to help manage these feelings.   ? ?6. Obesity with current BMI of 37.4 ?[name] is currently in the action stage of change. As such, her goal is to continue with weight loss efforts. She has agreed to the Category 1 Plan and keeping a food journal at supper and adhering to recommended goals of 400-500 calories and 35 grams of protein for supper ? ?Exercise goals: As is. ? ?Behavioral modification strategies: increasing lean protein intake, decreasing simple carbohydrates, and  planning for success. ? ?[name] has agreed to follow-up with our clinic in 2 to 3 weeks. She was informed of the importance of frequent follow-up visits to maximize her success with intensive lifestyle modifications for her multiple health conditions.  ? ?Objective:  ? ?Blood pressure 134/86, pulse 65, temperature 98 ?F (36.7 ?C), height '5\' 9"'$  (1.753 m), weight 253 lb (114.8 kg), SpO2 99 %. ?Body mass index is 37.36 kg/m?. ? ?General: Cooperative, alert, well developed, in no acute distress. ?HEENT: Conjunctivae and lids unremarkable. ?Cardiovascular: Regular rhythm.  ?Lungs: Normal work of breathing. ?Neurologic: No focal deficits.  ? ?Lab Results  ?Component Value Date  ? CREATININE 0.88 02/15/2021  ? BUN 8 02/15/2021  ? NA 138 02/15/2021  ? K 4.0 02/15/2021  ? CL 99 02/15/2021  ? CO2 25 02/15/2021  ? ?Lab Results  ?Component Value  Date  ? ALT 14 02/15/2021  ? AST 15 02/15/2021  ? ALKPHOS 71 02/15/2021  ? BILITOT 0.5 02/15/2021  ? ?Lab Results  ?Component Value Date  ? HGBA1C 5.1 02/15/2021  ? HGBA1C 5.1 12/29/2016  ? HGBA1C 5.1 07/19/2016  ? HGBA1C 5.4 09/25/2014  ? HGBA1C 5.3 04/14/2014  ? ?Lab Results  ?Component Value Date  ? INSULIN 9.3 02/15/2021  ? ?Lab Results  ?Component Value Date  ? TSH 2.660 02/15/2021  ? ?Lab Results  ?Component Value Date  ? CHOL 220 (H) 02/15/2021  ? HDL 57 02/15/2021  ? LDLCALC 136 (H) 02/15/2021  ? TRIG 151 (H) 02/15/2021  ? CHOLHDL 3.7 12/29/2016  ? ?Lab Results  ?Component Value Date  ? VD25OH 49.4 02/15/2021  ? ?Lab Results  ?Component Value Date  ? WBC 8.6 02/15/2021  ? HGB 14.7 02/15/2021  ? HCT 43.8 02/15/2021  ? MCV 91 02/15/2021  ? PLT 260 02/15/2021  ? ?Lab Results  ?Component Value Date  ? IRON 111 07/19/2020  ? TIBC 286 07/19/2020  ? FERRITIN 63 07/19/2020  ? ? ?Attestation Statements:  ? ?Reviewed by clinician on day of visit: allergies, medications, problem list, medical history, surgical history, family history, social history, and previous encounter notes. ? ?I,  Trixie Dredge, am acting as transcriptionist for [employer], DO. ? ?I have reviewed the above documentation for accuracy and completeness, and I agree with the above. Marjory Sneddon, D.O. ? ?T

## 2021-04-29 DIAGNOSIS — I10 Essential (primary) hypertension: Secondary | ICD-10-CM | POA: Insufficient documentation

## 2021-04-29 DIAGNOSIS — G479 Sleep disorder, unspecified: Secondary | ICD-10-CM | POA: Insufficient documentation

## 2021-04-29 DIAGNOSIS — F39 Unspecified mood [affective] disorder: Secondary | ICD-10-CM | POA: Insufficient documentation

## 2021-04-29 DIAGNOSIS — K5909 Other constipation: Secondary | ICD-10-CM | POA: Insufficient documentation

## 2021-05-03 DIAGNOSIS — F603 Borderline personality disorder: Secondary | ICD-10-CM | POA: Diagnosis not present

## 2021-05-03 DIAGNOSIS — F3181 Bipolar II disorder: Secondary | ICD-10-CM | POA: Diagnosis not present

## 2021-05-03 DIAGNOSIS — F900 Attention-deficit hyperactivity disorder, predominantly inattentive type: Secondary | ICD-10-CM | POA: Diagnosis not present

## 2021-05-09 ENCOUNTER — Ambulatory Visit (INDEPENDENT_AMBULATORY_CARE_PROVIDER_SITE_OTHER): Payer: PPO | Admitting: Physician Assistant

## 2021-05-09 ENCOUNTER — Encounter (INDEPENDENT_AMBULATORY_CARE_PROVIDER_SITE_OTHER): Payer: Self-pay | Admitting: Physician Assistant

## 2021-05-11 ENCOUNTER — Other Ambulatory Visit: Payer: Self-pay

## 2021-05-11 ENCOUNTER — Ambulatory Visit: Payer: PPO | Attending: Family Medicine

## 2021-05-11 DIAGNOSIS — M542 Cervicalgia: Secondary | ICD-10-CM

## 2021-05-11 DIAGNOSIS — M6281 Muscle weakness (generalized): Secondary | ICD-10-CM
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
## 2021-05-15 ENCOUNTER — Encounter: Payer: Self-pay | Admitting: Family Medicine

## 2021-05-17 ENCOUNTER — Ambulatory Visit: Payer: PPO | Admitting: Family Medicine

## 2021-05-17 ENCOUNTER — Encounter: Payer: Self-pay | Admitting: Family Medicine

## 2021-05-17 VITALS — BP 120/80 | Ht 69.0 in | Wt 253.0 lb

## 2021-05-17 DIAGNOSIS — M7542 Impingement syndrome of left shoulder: Secondary | ICD-10-CM | POA: Diagnosis not present

## 2021-05-17 DIAGNOSIS — M47812 Spondylosis without myelopathy or radiculopathy, cervical region: Secondary | ICD-10-CM | POA: Diagnosis not present

## 2021-05-17 DIAGNOSIS — M47816 Spondylosis without myelopathy or radiculopathy, lumbar region: Secondary | ICD-10-CM | POA: Diagnosis not present

## 2021-05-17 DIAGNOSIS — M5416 Radiculopathy, lumbar region: Secondary | ICD-10-CM

## 2021-05-17 DIAGNOSIS — M5451 Vertebrogenic low back pain: Secondary | ICD-10-CM | POA: Diagnosis not present

## 2021-05-17 DIAGNOSIS — M542 Cervicalgia: Secondary | ICD-10-CM | POA: Diagnosis not present

## 2021-05-17 MED ORDER — KETOROLAC TROMETHAMINE 30 MG/ML IJ SOLN
30.0000 mg | Freq: Once | INTRAMUSCULAR | Status: AC
  Administered 2021-05-17: 30 mg via INTRAMUSCULAR

## 2021-05-17 MED ORDER — METHYLPREDNISOLONE ACETATE 40 MG/ML IJ SUSP
40.0000 mg | Freq: Once | INTRAMUSCULAR | Status: AC
  Administered 2021-05-17: 40 mg via INTRAMUSCULAR

## 2021-05-17 NOTE — Assessment & Plan Note (Signed)
Acute on chronic in nature.  Pain most consistent with her radicular type pain.  Exacerbated with movement and walking she has been doing. ?-Counseled on home exercise therapy and supportive care. ?-IM Depo-Medrol and Toradol. ? ?

## 2021-05-17 NOTE — Assessment & Plan Note (Signed)
Acute on chronic in nature.  Has been exacerbated as of late.  Worse with repetitive activities.  Does get popping intermittently. ?-Counseled on exercise therapy and supportive care. ?-IM Depo-Medrol and Toradol. ?-Pursue shockwave therapy. ?-Could consider further imaging.  ?

## 2021-05-17 NOTE — Progress Notes (Signed)
?[name] - 51 y.o. female MRN [id number]  Date of birth: 1970/02/23 ? ?SUBJECTIVE:  Including CC & ROS.  ?No chief complaint on file. ? ? ?[name] is a 51 y.o. female that is presenting with acute on chronic left shoulder pain and acute on chronic right sided radicular pain on the right leg.  She is having more popping in the left arm.  It is worse with lifting and repetitive activities.  The right leg pain was acutely flared over the past week with the prolonged walking that she was having to do.  It is worse with certain movements. ? ? ?Review of Systems ?See HPI  ? ?HISTORY: Past Medical, Surgical, Social, and Family History Reviewed & Updated per EMR.   ?Pertinent Historical Findings include: ? ?Past Medical History:  ?Diagnosis Date  ? Achilles tendinitis   ? ADHD (attention deficit hyperactivity disorder), inattentive type   ? Diagnosed as an adult after starting college  ? Allergy   ? Arthritis   ? Arthritis   ? Ataxia   ? Back pain   ? Bipolar I disorder 04/18/2014  ? most recent episode mixed  ? Borderline personality disorder 10/27/2011  ? Cataracts, bilateral   ? Chest pain   ? Chronic pain disorder   ? due to several injuries affecting numerous areas of her body throughout the years  ? 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 Dizziness   ? Eczema   ? Episodic cluster headache, not intractable 12/02/2014  ? Family history of genetic disease carrier   ? Ganglion cyst 09/29/2009  ? left wrist (2 cyst)  ? Generalized anxiety disorder 10/27/2011  ? History of multiple concussions   ? October 2018 and September 2019  ? Hyperprolactinemia   ? Hypertension   ? Insomnia 02/17/2015  ? Joint pain   ? Lipoma   ? 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 Migraine with status migrainosus 01/08/2013  ? 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 CHEK2  [phone] (Intronic)  ? Other fatigue   ? Pes planus   ? Photophobia of both eyes 05/04/2014  ? Sciatica   ? Seasonal allergies   ? Shortness of breath   ? Shortness of breath on exertion   ? Shoulder pain   ? ? ?Past Surgical History:  ?Procedure Laterality Date  ? ANKLE SURGERY  12/88  ? left   ? chest nodule  1990?  ? rt chest wall nodule removal  ? GANGLION CYST EXCISION  2011  ? lipoma removal    ? right bunioectomy    ? SHOULDER SURGERY  01/13/2011  ? right, partial tear  ? tumor resection left thigh    ? ? ? ?PHYSICAL EXAM:  ?VS: BP 120/80 (BP Location: Left Arm, Patient Position: Sitting)   Ht 5' 9"  (1.753 m)   Wt 253 lb (114.8 kg)   BMI 37.36 kg/m?  ?Physical Exam ?Gen: NAD, alert, cooperative with exam, well-appearing ?MSK:  ?Neurovascularly intact   ? ? ? ? ?ASSESSMENT & PLAN:  ? ?Shoulder impingement syndrome, left ?Acute on chronic in nature.  Has been exacerbated as of late.  Worse with repetitive activities.  Does get popping intermittently. ?-Counseled on exercise therapy and supportive care. ?-IM Depo-Medrol and Toradol. ?-Pursue shockwave therapy. ?-Could consider further imaging.  ? ?Lumbar radiculopathy ?Acute on chronic in  nature.  Pain most consistent with her radicular type pain.  Exacerbated with movement and walking she has been doing. ?-Counseled on home exercise therapy and supportive care. ?-IM Depo-Medrol and Toradol. ? ? ? ? ?

## 2021-05-17 NOTE — Addendum Note (Signed)
Addended by: Cresenciano Lick on: 05/17/2021 01:38 PM ? ? Modules accepted: Orders ? ?

## 2021-05-17 NOTE — Patient Instructions (Addendum)
Good to see you  ?Please try heat on the back and ice on the shoulder ?Please send me a message in MyChart with any questions or updates.  ?Please see me back to start shockwave therapy.  ? ?--Dr. Raeford Razor ? ?

## 2021-05-18 ENCOUNTER — Ambulatory Visit: Payer: PPO

## 2021-05-19 ENCOUNTER — Other Ambulatory Visit: Payer: Self-pay | Admitting: Rehabilitation

## 2021-05-19 DIAGNOSIS — M542 Cervicalgia: Secondary | ICD-10-CM

## 2021-05-19 DIAGNOSIS — M47892 Other spondylosis, cervical region: Secondary | ICD-10-CM

## 2021-05-19 DIAGNOSIS — M47812 Spondylosis without myelopathy or radiculopathy, cervical region: Secondary | ICD-10-CM

## 2021-05-19 DIAGNOSIS — M5412 Radiculopathy, cervical region: Secondary | ICD-10-CM

## 2021-05-20 ENCOUNTER — Encounter: Payer: PPO | Admitting: Physical Therapy

## 2021-05-23 ENCOUNTER — Ambulatory Visit (INDEPENDENT_AMBULATORY_CARE_PROVIDER_SITE_OTHER): Payer: PPO | Admitting: Physician Assistant

## 2021-05-23 ENCOUNTER — Encounter (INDEPENDENT_AMBULATORY_CARE_PROVIDER_SITE_OTHER): Payer: Self-pay | Admitting: Physician Assistant

## 2021-05-23 ENCOUNTER — Ambulatory Visit: Payer: PPO | Admitting: Family Medicine

## 2021-05-23 VITALS — BP 111/70 | Ht 69.0 in | Wt 252.0 lb

## 2021-05-23 DIAGNOSIS — E8881 Metabolic syndrome: Secondary | ICD-10-CM

## 2021-05-23 DIAGNOSIS — Z6837 Body mass index (BMI) 37.0-37.9, adult: Secondary | ICD-10-CM | POA: Diagnosis not present

## 2021-05-23 DIAGNOSIS — E669 Obesity, unspecified: Secondary | ICD-10-CM

## 2021-05-25 ENCOUNTER — Encounter: Payer: Self-pay | Admitting: Family Medicine

## 2021-05-25 ENCOUNTER — Ambulatory Visit: Payer: PPO

## 2021-05-25 ENCOUNTER — Ambulatory Visit
Admission: RE | Admit: 2021-05-25 | Discharge: 2021-05-25 | Disposition: A | Discharge status: Home / Self Care | Payer: PPO | Source: Ambulatory Visit | Attending: Rehabilitation | Admitting: Rehabilitation

## 2021-05-25 ENCOUNTER — Ambulatory Visit (INDEPENDENT_AMBULATORY_CARE_PROVIDER_SITE_OTHER): Payer: Self-pay | Admitting: Family Medicine

## 2021-05-25 DIAGNOSIS — M47892 Other spondylosis, cervical region: Secondary | ICD-10-CM

## 2021-05-25 DIAGNOSIS — M7542 Impingement syndrome of left shoulder: Secondary | ICD-10-CM

## 2021-05-25 DIAGNOSIS — M5412 Radiculopathy, cervical region: Secondary | ICD-10-CM

## 2021-05-25 DIAGNOSIS — M542 Cervicalgia: Secondary | ICD-10-CM | POA: Diagnosis not present

## 2021-05-25 DIAGNOSIS — M47812 Spondylosis without myelopathy or radiculopathy, cervical region: Secondary | ICD-10-CM

## 2021-05-25 MED ORDER — PREDNISONE 5 MG PO TABS: ORAL_TABLET | ORAL | 0 refills | Status: DC

## 2021-05-25 NOTE — Assessment & Plan Note (Signed)
Completed shockwave therapy  

## 2021-05-25 NOTE — Progress Notes (Signed)
?[name] - 51 y.o. female MRN [id number]  Date of birth: [birth year] ? ?SUBJECTIVE:  Including CC & ROS.  ?No chief complaint on file. ? ? ?[name] is a 51 y.o. female that is here for shockwave therapy. ? ? ?Review of Systems ?See HPI  ? ?HISTORY: Past Medical, Surgical, Social, and Family History Reviewed & Updated per EMR.   ?Pertinent Historical Findings include: ? ?Past Medical History:  ?Diagnosis Date  ? Achilles tendinitis   ? ADHD (attention deficit hyperactivity disorder), inattentive type   ? Diagnosed as an adult after starting college  ? Allergy   ? Arthritis   ? Arthritis   ? Ataxia   ? Back pain   ? Bipolar I disorder 04/18/2014  ? most recent episode mixed  ? Borderline personality disorder 10/27/2011  ? Cataracts, bilateral   ? Chest pain   ? Chronic pain disorder   ? due to several injuries affecting numerous areas of her body throughout the years  ? 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 Dizziness   ? Eczema   ? Episodic cluster headache, not intractable 12/02/2014  ? Family history of genetic disease carrier   ? Ganglion cyst 09/29/2009  ? left wrist (2 cyst)  ? Generalized anxiety disorder 10/27/2011  ? History of multiple concussions   ? October 2018 and September 2019  ? Hyperprolactinemia   ? Hypertension   ? Insomnia 02/17/2015  ? Joint pain   ? Lipoma   ? 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 Migraine with status migrainosus 01/08/2013  ? 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 CHEK2 [id number] (Intronic)  ? Other fatigue   ? Pes planus   ? Photophobia of both eyes 05/04/2014  ? Sciatica   ? Seasonal allergies   ? Shortness of breath   ? Shortness of breath on exertion   ? Shoulder pain   ? ? ?Past Surgical History:  ?Procedure Laterality Date  ? ANKLE SURGERY  12/88  ? left   ? chest nodule  1990?  ? rt chest wall  nodule removal  ? GANGLION CYST EXCISION  2011  ? lipoma removal    ? right bunioectomy    ? SHOULDER SURGERY  01/13/2011  ? right, partial tear  ? tumor resection left thigh    ? ? ? ?PHYSICAL EXAM:  ?VS: BP 102/62 (BP Location: Left Arm, Patient Position: Sitting)   Ht 5' 9"  (1.753 m)   Wt 252 lb (114.3 kg)   BMI 37.21 kg/m?  ?Physical Exam ?Gen: NAD, alert, cooperative with exam, well-appearing ?MSK:  ?Neurovascularly intact   ? ?ECSWT Note ?[name] ?02-05-1970 ? ?Procedure: ECSWT ?Indications: left shoulder pain  ? ?Procedure Details ?Consent: Risks of procedure as well as the alternatives and risks of each were explained to the (patient/caregiver).  Consent for procedure obtained. ?Time Out: Verified patient identification, verified procedure, site/side was marked, verified correct patient position, special equipment/implants available, medications/allergies/relevent history reviewed, required imaging and test results available.  Performed.  The area was cleaned with iodine and alcohol swabs.   ? ?The left shoulder was targeted for Extracorporeal shockwave therapy.  ? ?Preset: shoulder problems ?Power Level: 60 ?Frequency: 10 ?Impulse/cycles: 2200 ?Head size: medium  ?Session: 1 ? ?Patient did tolerate procedure well. ? ? ? ?ASSESSMENT & PLAN:  ? ?Shoulder impingement  syndrome, left ?Completed shockwave therapy.  ? ? ? ? ?
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
## 2021-05-31 ENCOUNTER — Other Ambulatory Visit: Payer: Self-pay | Admitting: Adult Health

## 2021-06-01 ENCOUNTER — Ambulatory Visit: Payer: PPO

## 2021-06-01 ENCOUNTER — Encounter: Payer: Self-pay | Admitting: Family Medicine

## 2021-06-01 ENCOUNTER — Ambulatory Visit (INDEPENDENT_AMBULATORY_CARE_PROVIDER_SITE_OTHER): Payer: PPO | Admitting: Family Medicine

## 2021-06-01 VITALS — BP 122/80 | Ht 69.0 in | Wt 252.0 lb

## 2021-06-01 DIAGNOSIS — S93602A Unspecified sprain of left foot, initial encounter: Secondary | ICD-10-CM | POA: Insufficient documentation

## 2021-06-01 DIAGNOSIS — M7542 Impingement syndrome of left shoulder: Secondary | ICD-10-CM | POA: Diagnosis not present

## 2021-06-01 NOTE — Patient Instructions (Signed)
Good to see you ?We'll get the MRI at [facility]  ?We can do the orthotics in the future   ?Please send me a message in MyChart with any questions or updates.  ?We'll setup a virtual visit once the MRI is resulted.  ? ?--Dr. Raeford Razor ? ?

## 2021-06-01 NOTE — Assessment & Plan Note (Signed)
Acute on chronic in nature.  Had a recent movement that seem to exacerbate her symptoms and with cause for possible tear.  Previous x-ray has been normal.  We have tried injections. ?-Counseled on home exercise therapy and supportive care. ?-MRI of the left shoulder to evaluate for rotator cuff tear and for presurgical planning.  ?

## 2021-06-01 NOTE — Assessment & Plan Note (Signed)
Acutely occurring.  Having pain across the Lisfranc joint.  More consistent with a sprain at this time. ?-Counseled on home exercise therapy and supportive care. ?- Midfoot arch strap. ?-Pursue custom orthotics.  ?

## 2021-06-01 NOTE — Progress Notes (Signed)
?[name] - 51 y.o. female MRN [id number]  Date of birth: May 27, 1970 ? ?SUBJECTIVE:  Including CC & ROS.  ?No chief complaint on file. ? ? ?[name] is a 51 y.o. female that is presenting with acute on chronic left shoulder pain and acute left foot pain.  She denies any injury to the left foot.  She felt a pop and a tear in her left shoulder when she was reaching up and out. ? ? ?Review of Systems ?See HPI  ? ?HISTORY: Past Medical, Surgical, Social, and Family History Reviewed & Updated per EMR.   ?Pertinent Historical Findings include: ? ?Past Medical History:  ?Diagnosis Date  ? Achilles tendinitis   ? ADHD (attention deficit hyperactivity disorder), inattentive type   ? Diagnosed as an adult after starting college  ? Allergy   ? Arthritis   ? Arthritis   ? Ataxia   ? Back pain   ? Bipolar I disorder 04/18/2014  ? most recent episode mixed  ? Borderline personality disorder 10/27/2011  ? Cataracts, bilateral   ? Chest pain   ? Chronic pain disorder   ? due to several injuries affecting numerous areas of her body throughout the years  ? 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 Dizziness   ? Eczema   ? Episodic cluster headache, not intractable 12/02/2014  ? Family history of genetic disease carrier   ? Ganglion cyst 09/29/2009  ? left wrist (2 cyst)  ? Generalized anxiety disorder 10/27/2011  ? History of multiple concussions   ? October 2018 and September 2019  ? Hyperprolactinemia   ? Hypertension   ? Insomnia 02/17/2015  ? Joint pain   ? Lipoma   ? 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 Migraine with status migrainosus 01/08/2013  ? 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 CHEK2 [id number] (Intronic)  ? Other fatigue   ? Pes planus   ? Photophobia of both eyes 05/04/2014  ? Sciatica   ? Seasonal allergies   ? Shortness of breath   ? Shortness of  breath on exertion   ? Shoulder pain   ? ? ?Past Surgical History:  ?Procedure Laterality Date  ? ANKLE SURGERY  12/88  ? left   ? chest nodule  1990?  ? rt chest wall nodule removal  ? GANGLION CYST EXCISION  2011  ? lipoma removal    ? right bunioectomy    ? SHOULDER SURGERY  01/13/2011  ? right, partial tear  ? tumor resection left thigh    ? ? ? ?PHYSICAL EXAM:  ?VS: BP 122/80 (BP Location: Left Arm, Patient Position: Sitting)   Ht 5' 9"  (1.753 m)   Wt 252 lb (114.3 kg)   BMI 37.21 kg/m?  ?Physical Exam ?Gen: NAD, alert, cooperative with exam, well-appearing ?MSK:  ?Left shoulder: ?Limited flexion and abduction. ?Weakness with empty can test. ?Positive O'Brien's test. ?Neurovascularly intact   ? ? ? ? ?ASSESSMENT & PLAN:  ? ?Shoulder impingement syndrome, left ?Acute on chronic in nature.  Had a recent movement that seem to exacerbate her symptoms and with cause for possible tear.  Previous x-ray has been normal.  We have tried injections. ?-Counseled on home exercise therapy and supportive care. ?-MRI of the left shoulder to evaluate for rotator cuff tear and for presurgical planning.  ? ?Foot sprain, left,  initial encounter ?Acutely occurring.  Having pain across the Lisfranc joint.  More consistent with a sprain at this time. ?-Counseled on home exercise therapy and supportive care. ?- Midfoot arch strap. ?-Pursue custom orthotics.  ? ? ? ? ?

## 2021-06-04 ENCOUNTER — Other Ambulatory Visit: Payer: Self-pay

## 2021-06-04 ENCOUNTER — Emergency Department (HOSPITAL_COMMUNITY): Payer: Worker's Compensation

## 2021-06-04 ENCOUNTER — Emergency Department (HOSPITAL_COMMUNITY)
Admission: EM | Admit: 2021-06-04 | Discharge: 2021-06-04 | Disposition: A | Discharge status: Home / Self Care | Payer: Worker's Compensation | Attending: Emergency Medicine | Admitting: Emergency Medicine

## 2021-06-04 ENCOUNTER — Encounter (HOSPITAL_COMMUNITY): Payer: Self-pay

## 2021-06-04 DIAGNOSIS — W230XXA Caught, crushed, jammed, or pinched between moving objects, initial encounter: Secondary | ICD-10-CM | POA: Insufficient documentation

## 2021-06-04 DIAGNOSIS — S60211A Contusion of right wrist, initial encounter: Secondary | ICD-10-CM | POA: Diagnosis not present

## 2021-06-04 DIAGNOSIS — S4991XA Unspecified injury of right shoulder and upper arm, initial encounter: Secondary | ICD-10-CM | POA: Diagnosis present

## 2021-06-04 DIAGNOSIS — Y990 Civilian activity done for income or pay: Secondary | ICD-10-CM | POA: Insufficient documentation

## 2021-06-04 NOTE — Discharge Instructions (Signed)
Recheck with your workers comp provider if not improving. Apply ice for 20 minutes at a time for the first 2 days, then use a warm compress as needed for 20 minutes at a time. Take motrin or tylenol as needed as directed.  ?

## 2021-06-04 NOTE — ED Provider Notes (Signed)
[place] [facility] DEPT Provider Note   CSN: [id number] Arrival date & time: 06/04/21  2126     History  Chief Complaint  Patient presents with   Arm Injury    [name] is a 51 y.o. female.  51 year old female presents with complaint of right arm pain after work related injury, states she used her right hand/arm to push open a door at work however the door was blocked on the other side and did not open resulting in an impact type injury to her arm. Reports pain primarily at the wrist, radiates into the hand and up the arm. No history of osteoporosis. Pain is worse with movement, no relief with OTC pain reliever. No other injuries or concerns.       Home Medications Prior to Admission medications   Medication Sig Start Date End Date Taking? Authorizing Provider  ALFALFA PO Take 500 mg by mouth daily.    [provider]  amphetamine-dextroamphetamine (ADDERALL XR) 20 MG 24 hr capsule 1 capsule in the morning    [provider]  Ascorbic Acid (VITAMIN C) 1000 MG tablet Take 1,000 mg by mouth daily.    [provider]  calcium carbonate (OSCAL) 1500 (600 Ca) MG TABS tablet in the morning and at bedtime.    [provider]  cetirizine (ZYRTEC) 10 MG chewable tablet Chew 10 mg by mouth daily.    [provider]  Cholecalciferol (D3 5000) 125 MCG (5000 UT) capsule Take 1 capsule (5,000 Units total) by mouth daily. 03/05/21   Opalski, [name], DO  Cyanocobalamin (B-12 PO) Take 5,000 mcg by mouth daily.    [provider]  divalproex (DEPAKOTE ER) 500 MG 24 hr tablet 2 at night by mouth 03/28/21   Dohmeier, Asencion Partridge, MD  docusate sodium (COLACE) 100 MG capsule Take 1 capsule (100 mg total) by mouth 2 (two) times daily. 03/01/21   Opalski, Deborah, DO  EMGALITY 120 MG/ML SOSY INJECT 120NG(1ML) INTO THE SKIN EVERY 30 DAYS 06/01/21   Ward Givens, NP  hydrOXYzine (VISTARIL) 50 MG capsule Take 50 mg by mouth in the  morning and at bedtime.  05/18/17   [provider]  lamoTRIgine (LAMICTAL) 150 MG tablet Take 300 mg by mouth at bedtime. 07/27/17   [provider]  LATUDA 20 MG TABS tablet Take 20 mg by mouth every morning. 04/28/19   [provider]  lurasidone (LATUDA) 80 MG TABS tablet Take 1 tablet (80 mg total) by mouth daily with supper. (Along with '20mg'$  in AM for mood) 04/25/21   Opalski, [name], DO  metoprolol succinate (TOPROL-XL) 100 MG 24 hr tablet Take 1 tablet (100 mg total) by mouth daily. Take with or immediately following a meal. 01/06/17   Money, Lowry Ram, FNP  Multiple Vitamin (MULTIVITAMIN WITH MINERALS) TABS tablet Take 1 tablet by mouth daily.    [provider]  mupirocin ointment (BACTROBAN) 2 % Apply topically as needed. 01/28/20   [provider]  Omega-3 Fatty Acids (OMEGA 3 PO) Take by mouth.    [provider]  polyethylene glycol powder (GLYCOLAX/MIRALAX) 17 GM/SCOOP powder Take 17 g by mouth 2 (two) times daily as needed for moderate constipation. 03/01/21   Opalski, [name], DO  promethazine (PHENERGAN) 25 MG tablet Take 1 tablet (25 mg total) by mouth 2 (two) times daily as needed for nausea or vomiting. 02/02/21   Dohmeier, Asencion Partridge, MD  SUMAtriptan 6 MG/0.5ML SOAJ Inject 0.58m onset of migraine and  may repeat in 2 hours as needed (max 24m / 24 hours). 08/23/20   MWard Givens NP  traZODone (DESYREL) 100 MG tablet Take 1 tablet (100 mg total) by mouth at bedtime. 04/25/21   OMellody Dance DO  Zinc 50 MG TABS 1 tablet    [provider]      Allergies    Adhesive [tape], Amoxicillin, Dilaudid [hydromorphone hcl], Morphine, Penicillins, Percocet [oxycodone-acetaminophen], Prednisone, Provera [medroxyprogesterone acetate], and Ultram [tramadol hcl]    Review of Systems   Review of Systems Negative except as per HPI Physical Exam Updated Vital Signs BP (!) 148/88 (BP Location: Left Arm)   Pulse 70   Temp 98.8 F (37.1  C) (Oral)   Resp 16   Ht '5\' 9"'$  (1.753 m)   Wt 114.3 kg   SpO2 100%   BMI 37.21 kg/m  Physical Exam Vitals and nursing note reviewed.  Constitutional:      General: She is not in acute distress.    Appearance: She is well-developed. She is not diaphoretic.  HENT:     Head: Normocephalic and atraumatic.  Cardiovascular:     Pulses: Normal pulses.  Pulmonary:     Effort: Pulmonary effort is normal.  Musculoskeletal:        General: Tenderness present. No swelling or deformity. Normal range of motion.     Comments: TTP at distal right radius without deformity/swelling/ecchymosis. FROM elbow, wrist, hand. Cap refill intact, sensation intact.   Skin:    General: Skin is warm and dry.     Findings: No bruising, erythema or rash.  Neurological:     Mental Status: She is alert and oriented to person, place, and time.  Psychiatric:        Behavior: Behavior normal.    ED Results / Procedures / Treatments   Labs (all labs ordered are listed, but only abnormal results are displayed) Labs Reviewed - No data to display  EKG None  Radiology DG Wrist Complete Right  Result Date: 06/04/2021 CLINICAL DATA:  Injury, pain. EXAM: RIGHT WRIST - COMPLETE 3+ VIEW COMPARISON:  None Available. FINDINGS: There is no evidence of fracture or dislocation. There is no evidence of arthropathy or other focal bone abnormality. Soft tissues are unremarkable. IMPRESSION: Negative. Electronically Signed   By: ARonney AstersM.D.   On: 06/04/2021 22:41    Procedures Procedures    Medications Ordered in ED Medications - No data to display  ED Course/ Medical Decision Making/ A&P                           Medical Decision Making Amount and/or Complexity of Data Reviewed Radiology: ordered.   51yo female female with right arm pain as above. Found to have tenderness at the distal radius. Suspect contusion, offered x-ray for piece of mind. XR right wrist as ordered and interpreted by myself- negative  for acute bony injury, agrees with radiologist reading. Recommend ice, motrin, tylenol. [facility]           Final Clinical Impression(s) / ED Diagnoses Final diagnoses:  Contusion of right wrist, initial encounter    Rx / DC Orders ED Discharge Orders     None         MRoque Lias05/06/23 2249    HLuna Fuse MD 06/17/21 2223

## 2021-06-04 NOTE — ED Triage Notes (Addendum)
Patient was at work and her right arm got caught/ pinned in between a cart. Pain is from her elbow down to her hand. ?

## 2021-06-06 ENCOUNTER — Ambulatory Visit (INDEPENDENT_AMBULATORY_CARE_PROVIDER_SITE_OTHER): Payer: PPO | Admitting: Family Medicine

## 2021-06-06 ENCOUNTER — Encounter (INDEPENDENT_AMBULATORY_CARE_PROVIDER_SITE_OTHER): Payer: Self-pay | Admitting: Family Medicine

## 2021-06-06 VITALS — BP 108/73 | HR 72 | Temp 97.8°F | Ht 69.0 in | Wt 251.0 lb

## 2021-06-06 DIAGNOSIS — E669 Obesity, unspecified: Secondary | ICD-10-CM

## 2021-06-06 DIAGNOSIS — H43813 Vitreous degeneration, bilateral: Secondary | ICD-10-CM | POA: Diagnosis not present

## 2021-06-06 DIAGNOSIS — H524 Presbyopia: Secondary | ICD-10-CM | POA: Diagnosis not present

## 2021-06-06 DIAGNOSIS — E8881 Metabolic syndrome: Secondary | ICD-10-CM

## 2021-06-06 DIAGNOSIS — Z961 Presence of intraocular lens: Secondary | ICD-10-CM | POA: Diagnosis not present

## 2021-06-06 DIAGNOSIS — Z6837 Body mass index (BMI) 37.0-37.9, adult: Secondary | ICD-10-CM

## 2021-06-06 DIAGNOSIS — H5213 Myopia, bilateral: Secondary | ICD-10-CM | POA: Diagnosis not present

## 2021-06-08 ENCOUNTER — Ambulatory Visit: Payer: PPO

## 2021-06-09 DIAGNOSIS — M47816 Spondylosis without myelopathy or radiculopathy, lumbar region: Secondary | ICD-10-CM | POA: Diagnosis not present

## 2021-06-09 DIAGNOSIS — M47812 Spondylosis without myelopathy or radiculopathy, cervical region: Secondary | ICD-10-CM | POA: Diagnosis not present

## 2021-06-09 DIAGNOSIS — M542 Cervicalgia: Secondary | ICD-10-CM | POA: Diagnosis not present
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
## 2021-06-18 ENCOUNTER — Ambulatory Visit
Admission: RE | Admit: 2021-06-18 | Discharge: 2021-06-18 | Disposition: A | Discharge status: Home / Self Care | Payer: PPO | Source: Ambulatory Visit | Attending: Family Medicine | Admitting: Family Medicine

## 2021-06-18 DIAGNOSIS — M7542 Impingement syndrome of left shoulder: Secondary | ICD-10-CM

## 2021-06-18 DIAGNOSIS — M19012 Primary osteoarthritis, left shoulder: Secondary | ICD-10-CM | POA: Diagnosis not present

## 2021-06-18 DIAGNOSIS — M7552 Bursitis of left shoulder: Secondary | ICD-10-CM | POA: Diagnosis not present

## 2021-06-20 ENCOUNTER — Telehealth: Payer: Self-pay | Admitting: Family Medicine

## 2021-06-20 NOTE — Telephone Encounter (Signed)
Patient left msg to contact office regarding scheduling of Virtual Appt for MRI review.  -glh

## 2021-06-21 ENCOUNTER — Encounter: Payer: Self-pay | Admitting: Family Medicine

## 2021-06-21 ENCOUNTER — Telehealth (INDEPENDENT_AMBULATORY_CARE_PROVIDER_SITE_OTHER): Payer: PPO | Admitting: Family Medicine

## 2021-06-21 DIAGNOSIS — M7552 Bursitis of left shoulder: Secondary | ICD-10-CM | POA: Diagnosis not present

## 2021-06-21 NOTE — Progress Notes (Signed)
Virtual Visit via Video Note  I connected with [name] on 06/21/21 at 10:50 AM EDT by a video enabled telemedicine application and verified that I am speaking with the correct person using two identifiers.  Location: Patient: home Provider: office   I discussed the limitations of evaluation and management by telemedicine and the availability of in person appointments. The patient expressed understanding and agreed to proceed.  History of Present Illness:  [name] is a 51 year old female that is following up after the MRI of the left shoulder.  This was showing tendinosis of the rotator cuff as well as bursitis.   Observations/Objective:   Assessment and Plan:  Subacromial bursitis of left shoulder: MRI was revealing for bursitis as well as tendinosis.  We have tried at least 1 injection of steroid around the shoulder. -Counseled on home exercise therapy and supportive care. -Referral to physical therapy. -Could consider prolotherapy or shockwave therapy.  Follow Up Instructions:    I discussed the assessment and treatment plan with the patient. The patient was provided an opportunity to ask questions and all were answered. The patient agreed with the plan and demonstrated an understanding of the instructions.   The patient was advised to call back or seek an in-person evaluation if the symptoms worsen or if the condition fails to improve as anticipated.    Clearance Coots, MD

## 2021-06-21 NOTE — Assessment & Plan Note (Signed)
MRI was revealing for bursitis as well as tendinosis.  We have tried at least 1 injection of steroid around the shoulder. -Counseled on home exercise therapy and supportive care. -Referral to physical therapy. -Could consider prolotherapy or shockwave therapy.

## 2021-06-22 ENCOUNTER — Ambulatory Visit (INDEPENDENT_AMBULATORY_CARE_PROVIDER_SITE_OTHER): Payer: PPO | Admitting: Family Medicine

## 2021-06-22 ENCOUNTER — Encounter (INDEPENDENT_AMBULATORY_CARE_PROVIDER_SITE_OTHER): Payer: Self-pay | Admitting: Family Medicine

## 2021-06-22 VITALS — BP 121/81 | HR 63 | Temp 97.4°F | Ht 69.0 in | Wt 246.0 lb

## 2021-06-22 DIAGNOSIS — E7849 Other hyperlipidemia: Secondary | ICD-10-CM

## 2021-06-22 DIAGNOSIS — E669 Obesity, unspecified: Secondary | ICD-10-CM

## 2021-06-22 DIAGNOSIS — E559 Vitamin D deficiency, unspecified: Secondary | ICD-10-CM

## 2021-06-22 DIAGNOSIS — E8881 Metabolic syndrome: Secondary | ICD-10-CM | POA: Diagnosis not present

## 2021-06-22 DIAGNOSIS — Z6836 Body mass index (BMI) 36.0-36.9, adult: Secondary | ICD-10-CM | POA: Diagnosis not present

## 2021-06-22 DIAGNOSIS — E538 Deficiency of other specified B group vitamins: Secondary | ICD-10-CM | POA: Diagnosis not present

## 2021-07-01 NOTE — Progress Notes (Signed)
Chief Complaint:   OBESITY [name] is here to discuss her progress with her obesity treatment plan along with follow-up of her obesity related diagnoses. [name] is on keeping a food journal and adhering to recommended goals of 1100-1200 calories and 90+ grams protein and states she is following her eating plan approximately 75% of the time. [name] states she is not currently exercising.  Today's visit was #: 9 Starting weight: 270 lbs Starting date: 02/15/2021 Today's weight: 246 lbs Today's date: 06/22/2021 Total lbs lost to date: 24 Total lbs lost since last in-office visit: 5  Interim History: We changed [name]'s meal plan at her last OV, and she has increased her protein intake and denies hunger or cravings. She is drinking 6+ bottles of water per day.  Subjective:   1. Insulin resistance Pt's fasting insulin 4 months ago was 9.3.   2. Vitamin D deficiency She reports good energy with taking OTC Vit D 5,000 IU daily.  3. B12 deficiency Pt's B12 level was over 2000 at last check 4 months ago. Daily dose was cut in half.  4. Other hyperlipidemia She is on Omega 3's for her elevated triglycerides and elevated LDL per cardiology.  Assessment/Plan:  No orders of the defined types were placed in this encounter.   There are no discontinued medications.   No orders of the defined types were placed in this encounter.    1. Insulin resistance [name] will continue to work on weight loss, exercise, and decreasing simple carbohydrates to help decrease the risk of diabetes. [name] agreed to follow-up with [place] as directed to closely monitor her progress. Check fasting labs at next OV.  2. Vitamin D deficiency Low Vitamin D level contributes to fatigue and are associated with obesity, breast, and colon cancer. She agrees to continue to take OTC Vitamin D 5,000 IU daily, as levels at perfect at 49.4 and will follow-up for routine testing of Vitamin D, at least 2-3 times per year to  avoid over-replacement. Repeat labs at next OV.  3. B12 deficiency The diagnosis was reviewed with the patient. Counseling provided today, see below. We will continue to monitor. Orders and follow up as documented in patient record. Continue OTC supplementation. Repeat labs at next OV.  Counseling The body needs vitamin B12: to make red blood cells; to make DNA; and to help the nerves work properly so they can carry messages from the brain to the body.  The main causes of vitamin B12 deficiency include dietary deficiency, digestive diseases, pernicious anemia, and having a surgery in which part of the stomach or small intestine is removed.  Certain medicines can make it harder for the body to absorb vitamin B12. These medicines include: heartburn medications; some antibiotics; some medications used to treat diabetes, gout, and high cholesterol.  In some cases, there are no symptoms of this condition. If the condition leads to anemia or nerve damage, various symptoms can occur, such as weakness or fatigue, shortness of breath, and numbness or tingling in your hands and feet.   Treatment:  May include taking vitamin B12 supplements.  Avoid alcohol.  Eat lots of healthy foods that contain vitamin B12: Beef, pork, chicken, [place], and organ meats, such as liver.  Seafood: This includes clams, rainbow trout, salmon, tuna, and haddock. Eggs.  Cereal and dairy products that are fortified: This means that vitamin B12 has been added to the food.   4. Other hyperlipidemia Cardiovascular risk and specific lipid/LDL goals reviewed.  We  discussed several lifestyle modifications today and [name] will continue to work on diet, exercise and weight loss efforts. Orders and follow up as documented in patient record. Continue prudent nutritional plan. Repeat labs at next OV.  Counseling Intensive lifestyle modifications are the first line treatment for this issue. Dietary changes: Increase soluble fiber.  Decrease simple carbohydrates. Exercise changes: Moderate to vigorous-intensity aerobic activity 150 minutes per week if tolerated. Lipid-lowering medications: see documented in medical record.  5. Obesity with current BMI of 36.4 [name] is currently in the action stage of change. As such, her goal is to continue with weight loss efforts. She has agreed to keeping a food journal and adhering to recommended goals of 1100-1200 calories and 90+ grams protein.   Exercise goals:  As is due to back pain.  Behavioral modification strategies: increasing water intake and keeping a strict food journal.  Avalynne has agreed to follow-up with our clinic in 2 weeks, fasting for labs. She was informed of the importance of frequent follow-up visits to maximize her success with intensive lifestyle modifications for her multiple health conditions.   Objective:   Blood pressure 121/81, pulse 63, temperature (!) 97.4 F (36.3 C), height '5\' 9"'$  (1.753 m), weight 246 lb (111.6 kg), SpO2 97 %. Body mass index is 36.33 kg/m.  General: Cooperative, alert, well developed, in no acute distress. HEENT: Conjunctivae and lids unremarkable. Cardiovascular: Regular rhythm.  Lungs: Normal work of breathing. Neurologic: No focal deficits.   Lab Results  Component Value Date   CREATININE 0.88 02/15/2021   BUN 8 02/15/2021   NA 138 02/15/2021   K 4.0 02/15/2021   CL 99 02/15/2021   CO2 25 02/15/2021   Lab Results  Component Value Date   ALT 14 02/15/2021   AST 15 02/15/2021   ALKPHOS 71 02/15/2021   BILITOT 0.5 02/15/2021   Lab Results  Component Value Date   HGBA1C 5.1 02/15/2021   HGBA1C 5.1 12/29/2016   HGBA1C 5.1 07/19/2016   HGBA1C 5.4 09/25/2014   HGBA1C 5.3 04/14/2014   Lab Results  Component Value Date   INSULIN 9.3 02/15/2021   Lab Results  Component Value Date   TSH 2.660 02/15/2021   Lab Results  Component Value Date   CHOL 220 (H) 02/15/2021   HDL 57 02/15/2021   LDLCALC 136 (H)  02/15/2021   TRIG 151 (H) 02/15/2021   CHOLHDL 3.7 12/29/2016   Lab Results  Component Value Date   VD25OH 49.4 02/15/2021   Lab Results  Component Value Date   WBC 8.6 02/15/2021   HGB 14.7 02/15/2021   HCT 43.8 02/15/2021   MCV 91 02/15/2021   PLT 260 02/15/2021   Lab Results  Component Value Date   IRON 111 07/19/2020   TIBC 286 07/19/2020   FERRITIN 63 07/19/2020    Obesity Behavioral Intervention:   Approximately 15 minutes were spent on the discussion below.  ASK: We discussed the diagnosis of obesity with [name] today and [name] agreed to give [place] permission to discuss obesity behavioral modification therapy today.  ASSESS: [name] has the diagnosis of obesity and her BMI today is 36.4. [name] is in the action stage of change.   ADVISE: [name] was educated on the multiple health risks of obesity as well as the benefit of weight loss to improve her health. She was advised of the need for long term treatment and the importance of lifestyle modifications to improve her current health and to decrease her risk of future health  problems.  AGREE: Multiple dietary modification options and treatment options were discussed and [name] agreed to follow the recommendations documented in the above note.  ARRANGE: [name] was educated on the importance of frequent visits to treat obesity as outlined per CMS and USPSTF guidelines and agreed to schedule her next follow up appointment today.  Attestation Statements:   Reviewed by clinician on day of visit: allergies, medications, problem list, medical history, surgical history, family history, social history, and previous encounter notes.  I, Kathlene November, BS, CMA, am acting as transcriptionist for [employer], DO.  I have reviewed the above documentation for accuracy and completeness, and I agree with the above. Marjory Sneddon, D.O.  The [place] was signed into law in 2016 which includes the topic of  electronic health records.  This provides immediate access to information in MyChart.  This includes consultation notes, operative notes, office notes, lab results and pathology reports.  If you have any questions about what you read please let us know at your next visit so we can discuss your concerns and take corrective action if need be.  We are right here with you.

## 2021-07-06 ENCOUNTER — Encounter (INDEPENDENT_AMBULATORY_CARE_PROVIDER_SITE_OTHER): Payer: Self-pay | Admitting: Family Medicine

## 2021-07-06 ENCOUNTER — Ambulatory Visit: Payer: PPO

## 2021-07-06 ENCOUNTER — Ambulatory Visit (INDEPENDENT_AMBULATORY_CARE_PROVIDER_SITE_OTHER): Payer: PPO | Admitting: Family Medicine

## 2021-07-06 VITALS — BP 106/66 | HR 58 | Temp 97.7°F | Ht 69.0 in | Wt 246.0 lb

## 2021-07-06 DIAGNOSIS — E7849 Other hyperlipidemia: Secondary | ICD-10-CM

## 2021-07-06 DIAGNOSIS — E559 Vitamin D deficiency, unspecified: Secondary | ICD-10-CM

## 2021-07-06 DIAGNOSIS — Z6836 Body mass index (BMI) 36.0-36.9, adult: Secondary | ICD-10-CM | POA: Diagnosis not present

## 2021-07-06 DIAGNOSIS — E669 Obesity, unspecified: Secondary | ICD-10-CM | POA: Diagnosis not present

## 2021-07-06 DIAGNOSIS — E538 Deficiency of other specified B group vitamins: Secondary | ICD-10-CM

## 2021-07-06 DIAGNOSIS — E8881 Metabolic syndrome: Secondary | ICD-10-CM

## 2021-07-06 DIAGNOSIS — I10 Essential (primary) hypertension: Secondary | ICD-10-CM

## 2021-07-07 LAB — COMPREHENSIVE METABOLIC PANEL
ALT: 13 IU/L (ref 0–32)
AST: 17 IU/L (ref 0–40)
Albumin/Globulin Ratio: 2.4 — ABNORMAL HIGH (ref 1.2–2.2)
Albumin: 4.3 g/dL (ref 3.8–4.9)
Alkaline Phosphatase: 80 IU/L (ref 44–121)
BUN/Creatinine Ratio: 17 (ref 9–23)
BUN: 15 mg/dL (ref 6–24)
Bilirubin Total: 0.4 mg/dL (ref 0.0–1.2)
CO2: 24 mmol/L (ref 20–29)
Calcium: 9.8 mg/dL (ref 8.7–10.2)
Chloride: 101 mmol/L (ref 96–106)
Creatinine, Ser: 0.88 mg/dL (ref 0.57–1.00)
Globulin, Total: 1.8 g/dL (ref 1.5–4.5)
Glucose: 88 mg/dL (ref 70–99)
Potassium: 4.5 mmol/L (ref 3.5–5.2)
Sodium: 139 mmol/L (ref 134–144)
Total Protein: 6.1 g/dL (ref 6.0–8.5)
eGFR: 80 mL/min/{1.73_m2} (ref >59)

## 2021-07-07 LAB — LIPID PANEL
Chol/HDL Ratio: 3.4 ratio (ref 0.0–4.4)
Cholesterol, Total: 190 mg/dL (ref 100–199)
HDL: 56 mg/dL (ref >39)
LDL Chol Calc (NIH): 123 mg/dL — ABNORMAL HIGH (ref 0–99)
Triglycerides: 56 mg/dL (ref 0–149)
VLDL Cholesterol Cal: 11 mg/dL (ref 5–40)

## 2021-07-07 LAB — VITAMIN B12: Vitamin B-12: >2000 pg/mL — ABNORMAL HIGH (ref 232–1245)

## 2021-07-07 LAB — INSULIN, RANDOM: INSULIN: 6 u[IU]/mL (ref 2.6–24.9)

## 2021-07-07 LAB — HEMOGLOBIN A1C
Est. average glucose Bld gHb Est-mCnc: 103 mg/dL
Hgb A1c MFr Bld: 5.2 % (ref 4.8–5.6)

## 2021-07-07 LAB — VITAMIN D 25 HYDROXY (VIT D DEFICIENCY, FRACTURES): Vit D, 25-Hydroxy: 74.2 ng/mL (ref 30.0–100.0)

## 2021-07-11 NOTE — Progress Notes (Signed)
Chief Complaint:   OBESITY [name] is here to discuss her progress with her obesity treatment plan along with follow-up of her obesity related diagnoses. [name] is on the Category 1 Plan and states she is following her eating plan approximately 75% of the time. [name] states she is walking 30-45 minutes 2-3 times per week.  Today's visit was #: 10 Starting weight: 270 lbs Starting date: 02/15/2021 Today's weight: 246 lbs Today's date: 07/06/2021 Total lbs lost to date: 24 Total lbs lost since last in-office visit: 0  Interim History: [name] reports she has a lot of stress at work lately and has been skipping some meals. However, she is making great choices most of the time when she eats. Pt denies issues or concerns.  Subjective:   1. Insulin resistance [name] denies hunger or cravings.  2. Other hyperlipidemia About 4 months ago, her LDL was 136 and triglycerides 151. Medication: OTC Omega 3  3. Essential hypertension BP low normal today. Pt has not checking BP at home lately.  4. B12 deficiency [name]'s B12 was greater than 2000 at last check. She stopped all B12 supplements 2-3 weeks ago.  5. Vitamin D deficiency She is taking OTC Vitamin D 5,000 IU daily.  Assessment/Plan:   Orders Placed This Encounter  Procedures   Vitamin B12   VITAMIN D 25 Hydroxy (Vit-D Deficiency, Fractures)   Lipid panel   Hemoglobin A1c   Insulin, random   Comprehensive metabolic panel    Medications Discontinued During This Encounter  Medication Reason   pregabalin (LYRICA) 50 MG capsule Duplicate     No orders of the defined types were placed in this encounter.    1. Insulin resistance [name] will continue to work on weight loss, exercise, and decreasing simple carbohydrates to help decrease the risk of diabetes. [name] agreed to follow-up with [place] as directed to closely monitor her progress. Obtain fasting labs today.  - Hemoglobin A1c - Insulin, random  2. Other  hyperlipidemia Cardiovascular risk and specific lipid/LDL goals reviewed.  We discussed several lifestyle modifications today and [name] will continue to work on diet, exercise and weight loss efforts. Orders and follow up as documented in patient record.   Counseling Intensive lifestyle modifications are the first line treatment for this issue. Dietary changes: Increase soluble fiber. Decrease simple carbohydrates. Exercise changes: Moderate to vigorous-intensity aerobic activity 150 minutes per week if tolerated. Lipid-lowering medications: see documented in medical record. Obtain fasting labs today.  - Lipid panel  3. Essential hypertension BP is low normal today. [name] is working on healthy weight loss and exercise to improve blood pressure control. We will watch for signs of hypotension as she continues her lifestyle modifications. Pt advised to check BP daily and if she develops any lightheadedness or other hypotensive symptoms, contact PCP or [place]. Bring in BP log to next OV.  Obtain fasting labs today.  - Comprehensive metabolic panel  4. B12 deficiency The diagnosis was reviewed with the patient. Counseling provided today, see below. We will continue to monitor. Orders and follow up as documented in patient record. Continue to hold B12 supplement and only follow prudent nutritional plan.  Counseling The body needs vitamin B12: to make red blood cells; to make DNA; and to help the nerves work properly so they can carry messages from the brain to the body.  The main causes of vitamin B12 deficiency include dietary deficiency, digestive diseases, pernicious anemia, and having a surgery in which part of the stomach  or small intestine is removed.  Certain medicines can make it harder for the body to absorb vitamin B12. These medicines include: heartburn medications; some antibiotics; some medications used to treat diabetes, gout, and high cholesterol.  In some cases, there are no symptoms  of this condition. If the condition leads to anemia or nerve damage, various symptoms can occur, such as weakness or fatigue, shortness of breath, and numbness or tingling in your hands and feet.   Treatment:  May include taking vitamin B12 supplements.  Avoid alcohol.  Eat lots of healthy foods that contain vitamin B12: Beef, pork, chicken, [place], and organ meats, such as liver.  Seafood: This includes clams, rainbow trout, salmon, tuna, and haddock. Eggs.  Cereal and dairy products that are fortified: This means that vitamin B12 has been added to the food.  Obtain fasting labs today.  - Vitamin B12  5. Vitamin D deficiency Low Vitamin D level contributes to fatigue and are associated with obesity, breast, and colon cancer. She agrees to continue to take OTC Vitamin D 5,000 IU daily and will follow-up for routine testing of Vitamin D, at least 2-3 times per year to avoid over-replacement. Obtain fasting labs today.  - VITAMIN D 25 Hydroxy (Vit-D Deficiency, Fractures)  6. Obesity, Current BMI 36.4 [name] is currently in the action stage of change. As such, her goal is to continue with weight loss efforts. She has agreed to keeping a food journal and adhering to recommended goals of 1100-1200 calories and 90+ grams protein.   Exercise goals:  As is  Behavioral modification strategies: increasing lean protein intake, no skipping meals, and planning for success.  [name] has agreed to follow-up with our clinic in 2 weeks. She was informed of the importance of frequent follow-up visits to maximize her success with intensive lifestyle modifications for her multiple health conditions.   [name] was informed we would discuss her lab results at her next visit unless there is a critical issue that needs to be addressed sooner. [name] agreed to keep her next visit at the agreed upon time to discuss these results.  Objective:   Blood pressure 106/66, pulse (!) 58, temperature 97.7 F (36.5 C),  height '5\' 9"'$  (1.753 m), weight 246 lb (111.6 kg), SpO2 98 %. Body mass index is 36.33 kg/m.  General: Cooperative, alert, well developed, in no acute distress. HEENT: Conjunctivae and lids unremarkable. Cardiovascular: Regular rhythm.  Lungs: Normal work of breathing. Neurologic: No focal deficits.   Lab Results  Component Value Date   CREATININE 0.88 07/06/2021   BUN 15 07/06/2021   NA 139 07/06/2021   K 4.5 07/06/2021   CL 101 07/06/2021   CO2 24 07/06/2021   Lab Results  Component Value Date   ALT 13 07/06/2021   AST 17 07/06/2021   ALKPHOS 80 07/06/2021   BILITOT 0.4 07/06/2021   Lab Results  Component Value Date   HGBA1C 5.2 07/06/2021   HGBA1C 5.1 02/15/2021   HGBA1C 5.1 12/29/2016   HGBA1C 5.1 07/19/2016   HGBA1C 5.4 09/25/2014   Lab Results  Component Value Date   INSULIN 6.0 07/06/2021   INSULIN 9.3 02/15/2021   Lab Results  Component Value Date   TSH 2.660 02/15/2021   Lab Results  Component Value Date   CHOL 190 07/06/2021   HDL 56 07/06/2021   LDLCALC 123 (H) 07/06/2021   TRIG 56 07/06/2021   CHOLHDL 3.4 07/06/2021   Lab Results  Component Value Date   VD25OH 59.2  07/06/2021   VD25OH 49.4 02/15/2021   Lab Results  Component Value Date   WBC 8.6 02/15/2021   HGB 14.7 02/15/2021   HCT 43.8 02/15/2021   MCV 91 02/15/2021   PLT 260 02/15/2021   Lab Results  Component Value Date   IRON 111 07/19/2020   TIBC 286 07/19/2020   FERRITIN 63 07/19/2020    Obesity Behavioral Intervention:   Approximately 15 minutes were spent on the discussion below.  ASK: We discussed the diagnosis of obesity with [name] today and [name] agreed to give [place] permission to discuss obesity behavioral modification therapy today.  ASSESS: [name] has the diagnosis of obesity and her BMI today is 36.4. Laylee is in the action stage of change.   ADVISE: [place] was educated on the multiple health risks of obesity as well as the benefit of weight loss to improve  her health. She was advised of the need for long term treatment and the importance of lifestyle modifications to improve her current health and to decrease her risk of future health problems.  AGREE: Multiple dietary modification options and treatment options were discussed and Bethenny agreed to follow the recommendations documented in the above note.  ARRANGE: M[place]iah was educated on the importance of frequent visits to treat obesity as outlined per CMS and USPSTF guidelines and agreed to schedule her next follow up appointment today.  Attestation Statements:   Reviewed by clinician on day of visit: allergies, medications, problem list, medical history, surgical history, family history, social history, and previous encounter notes.  I, Kathlene November, BS, CMA, am acting as transcriptionist for [employer], DO.  I have reviewed the above documentation for accuracy and completeness, and I agree with the above. [name], D.O.  The [place] was signed into law in 2016 which includes the topic of electronic health records.  This provides immediate access to information in MyChart.  This includes consultation notes, operative notes, office notes, lab results and pathology reports.  If you have any questions about what you read please let us know at your next visit so we can discuss your concerns and take corrective action if need be.  We are right here with you.

## 2021-07-20 ENCOUNTER — Ambulatory Visit (INDEPENDENT_AMBULATORY_CARE_PROVIDER_SITE_OTHER): Payer: PPO | Admitting: Family Medicine

## 2021-07-21 ENCOUNTER — Encounter (INDEPENDENT_AMBULATORY_CARE_PROVIDER_SITE_OTHER): Payer: Self-pay | Admitting: Family Medicine

## 2021-07-21 ENCOUNTER — Ambulatory Visit: Payer: PPO | Attending: Family Medicine | Admitting: Physical Therapy

## 2021-07-21 ENCOUNTER — Ambulatory Visit (INDEPENDENT_AMBULATORY_CARE_PROVIDER_SITE_OTHER): Payer: PPO | Admitting: Family Medicine

## 2021-07-21 ENCOUNTER — Encounter: Payer: Self-pay | Admitting: Physical Therapy

## 2021-07-21 ENCOUNTER — Encounter: Payer: Self-pay | Admitting: Adult Health

## 2021-07-21 ENCOUNTER — Ambulatory Visit: Payer: PPO | Admitting: Adult Health

## 2021-07-21 ENCOUNTER — Other Ambulatory Visit: Payer: Self-pay

## 2021-07-21 VITALS — BP 124/84 | HR 58 | Ht 69.0 in | Wt 253.8 lb

## 2021-07-21 VITALS — BP 114/75 | HR 60 | Temp 97.8°F | Ht 69.0 in | Wt 249.0 lb

## 2021-07-21 DIAGNOSIS — E8881 Metabolic syndrome: Secondary | ICD-10-CM

## 2021-07-21 DIAGNOSIS — E669 Obesity, unspecified: Secondary | ICD-10-CM

## 2021-07-21 DIAGNOSIS — M25512 Pain in left shoulder: Secondary | ICD-10-CM | POA: Diagnosis not present

## 2021-07-21 DIAGNOSIS — R42 Dizziness and giddiness: Secondary | ICD-10-CM

## 2021-07-21 DIAGNOSIS — G43009 Migraine without aura, not intractable, without status migrainosus: Secondary | ICD-10-CM | POA: Diagnosis not present

## 2021-07-21 DIAGNOSIS — M25612 Stiffness of left shoulder, not elsewhere classified: Secondary | ICD-10-CM | POA: Diagnosis not present

## 2021-07-21 DIAGNOSIS — M7552 Bursitis of left shoulder: Secondary | ICD-10-CM | POA: Insufficient documentation

## 2021-07-21 DIAGNOSIS — I10 Essential (primary) hypertension: Secondary | ICD-10-CM

## 2021-07-21 DIAGNOSIS — M542 Cervicalgia: Secondary | ICD-10-CM | POA: Insufficient documentation

## 2021-07-21 DIAGNOSIS — E559 Vitamin D deficiency, unspecified: Secondary | ICD-10-CM | POA: Diagnosis not present

## 2021-07-21 DIAGNOSIS — F5089 Other specified eating disorder: Secondary | ICD-10-CM

## 2021-07-21 DIAGNOSIS — Z6836 Body mass index (BMI) 36.0-36.9, adult: Secondary | ICD-10-CM

## 2021-07-21 DIAGNOSIS — E538 Deficiency of other specified B group vitamins: Secondary | ICD-10-CM | POA: Diagnosis not present

## 2021-07-21 NOTE — Patient Instructions (Addendum)
Migraine headaches  Continue monthly Emgality injection Continue Depakote ER 2000 mg daily.  Do exercises learned for vestibular rehab  If your symptoms worsen or you develop new symptoms please let us know.
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
## 2021-07-26 DIAGNOSIS — F509 Eating disorder, unspecified: Secondary | ICD-10-CM | POA: Insufficient documentation

## 2021-07-26 DIAGNOSIS — E559 Vitamin D deficiency, unspecified: Secondary | ICD-10-CM | POA: Insufficient documentation

## 2021-08-03 DIAGNOSIS — F900 Attention-deficit hyperactivity disorder, predominantly inattentive type: Secondary | ICD-10-CM | POA: Diagnosis not present

## 2021-08-03 DIAGNOSIS — F603 Borderline personality disorder: Secondary | ICD-10-CM | POA: Diagnosis not present

## 2021-08-03 DIAGNOSIS — F3181 Bipolar II disorder: Secondary | ICD-10-CM | POA: Diagnosis not present

## 2021-08-04 ENCOUNTER — Encounter: Payer: Self-pay | Admitting: Physical Therapy

## 2021-08-04 ENCOUNTER — Ambulatory Visit: Payer: PPO | Attending: Family Medicine | Admitting: Physical Therapy

## 2021-08-04 ENCOUNTER — Encounter (INDEPENDENT_AMBULATORY_CARE_PROVIDER_SITE_OTHER): Payer: Self-pay | Admitting: Family Medicine

## 2021-08-04 ENCOUNTER — Ambulatory Visit (INDEPENDENT_AMBULATORY_CARE_PROVIDER_SITE_OTHER): Payer: PPO | Admitting: Family Medicine

## 2021-08-04 VITALS — BP 110/74 | HR 64 | Temp 97.7°F | Ht 69.0 in | Wt 243.0 lb

## 2021-08-04 DIAGNOSIS — M6281 Muscle weakness (generalized): Secondary | ICD-10-CM | POA: Diagnosis not present

## 2021-08-04 DIAGNOSIS — M25512 Pain in left shoulder: Secondary | ICD-10-CM | POA: Diagnosis not present

## 2021-08-04 DIAGNOSIS — Z6835 Body mass index (BMI) 35.0-35.9, adult: Secondary | ICD-10-CM | POA: Diagnosis not present

## 2021-08-04 DIAGNOSIS — I10 Essential (primary) hypertension: Secondary | ICD-10-CM | POA: Diagnosis not present

## 2021-08-04 DIAGNOSIS — E669 Obesity, unspecified: Secondary | ICD-10-CM | POA: Diagnosis not present

## 2021-08-04 DIAGNOSIS — M25612 Stiffness of left shoulder, not elsewhere classified: Secondary | ICD-10-CM | POA: Diagnosis not present

## 2021-08-04 DIAGNOSIS — K5909 Other constipation: Secondary | ICD-10-CM

## 2021-08-04 DIAGNOSIS — M542 Cervicalgia: Secondary | ICD-10-CM | POA: Insufficient documentation

## 2021-08-04 DIAGNOSIS — E8881 Metabolic syndrome: Secondary | ICD-10-CM | POA: Diagnosis not present

## 2021-08-04 NOTE — Therapy (Signed)
OUTPATIENT PHYSICAL THERAPY SHOULDER EVALUATION   Patient Name: [name] MRN: [id number] DOB:[birth year], 51 y.o., female Today's Date: 08/04/2021   PT End of Session - 08/04/21 1553     Visit Number 2    Date for PT Re-Evaluation 10/21/21    PT Start Time 1555    PT Stop Time 1640    PT Time Calculation (min) 45 min    Activity Tolerance Patient tolerated treatment well    Behavior During Therapy [facility] for tasks assessed/performed             Past Medical History:  Diagnosis Date   Achilles tendinitis    ADHD (attention deficit hyperactivity disorder), inattentive type    Diagnosed as an adult after starting college   Allergy    Arthritis    Arthritis    Ataxia    Back pain    Bipolar I disorder 04/18/2014   most recent episode mixed   Borderline personality disorder 10/27/2011   Cataracts, bilateral    Chest pain    Chronic pain disorder    due to several injuries affecting numerous areas of her body throughout the years   Chronic paroxysmal hemicrania, 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   rt chest wall nodule removal   GANGLION CYST EXCISION  2011   lipoma removal     right bunioectomy     SHOULDER SURGERY  01/13/2011   right, partial tear   tumor resection left thigh     Patient Active Problem List   Diagnosis Date Noted   Vitamin D deficiency 07/26/2021   Eating disorder 07/26/2021   Insulin resistance 07/06/2021   Other hyperlipidemia 07/06/2021   B12 deficiency 07/06/2021   Foot sprain, left, initial encounter 06/01/2021   Mood disorder ([place]) 04/29/2021   Sleep difficulties 04/29/2021   Other constipation 04/29/2021   Essential hypertension 04/29/2021   Sprain of ankle 03/29/2021   Subacromial bursitis of left shoulder joint 03/29/2021   Concussion with no loss of consciousness 02/07/2021   Lumbar radiculopathy 02/07/2021   Shoulder impingement syndrome, left 11/01/2020   Sprain of metacarpophalangeal joint of left thumb 10/07/2020   Posterior tibial tendinitis of right lower extremity 09/13/2020   Labral tear of hip, degenerative 09/13/2020   Attention and concentration deficit 05/07/2020   Morbid obesity ([place]) 05/07/2020   Plantar wart 05/07/2020   Sleep  apnea 05/07/2020   Urinary incontinence 34/19/3790   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   Family history of genetic disease carrier    Family history of breast cancer    Family history of colon cancer    History of multiple concussions 11/20/2017   Ataxia 11/20/2017   Cervical strain, acute, initial encounter 09/19/2016   Insomnia 02/17/2015   Right shoulder pain 12/31/2014   Episodic cluster headache, not intractable 12/02/2014   Chronic paroxysmal hemicrania, 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   Photophobia of both eyes 05/04/2014   Nausea with vomiting 05/04/2014   Bipolar I disorder, most recent episode mixed ([place]) 04/18/2014   Suicidal ideation 04/12/2014    Injury of right shoulder and upper arm 02/17/2014   Migraine with status migrainosus 01/08/2013   Chronic migraine 05/08/2012   Hypertension    Contact dermatitis 11/27/2011   Generalized anxiety disorder 10/27/2011   ADHD (attention deficit hyperactivity disorder), 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   Borderline personality disorder ([place]) 10/27/2011   Right foot pain 09/28/2011   Loss of transverse plantar arch 09/01/2011   Malignant tumor of muscle ([place]) 09/02/2010   Ganglion cyst 09/29/2009   Pes planus 07/01/2008    PCP: Byrd Hesselbach  REFERRING PROVIDER: Beaulah Dinning DIAG: left shoulder bursitis, tendonitis  THERAPY DIAG:  Acute pain of left shoulder  Stiffness of left shoulder, not elsewhere classified  Cervicalgia  Neck pain  Muscle weakness (generalized)  Rationale for Evaluation and Treatment Rehabilitation  ONSET DATE: 05/30/21  SUBJECTIVE:                                                                                                                                                                                      SUBJECTIVE STATEMENT: Pain starts behind her L ear into L shoulder area. Use L arm when she has too.   PERTINENT HISTORY: Bipolar, ADHD, HA's, joint pain, dizziness  PAIN:  Are you having pain? Yes: NPRS scale: 6/10 Pain location: left neck and shoulder area Pain description: tightness, ache Aggravating factors: turning head, lifting, reaching oerhead at worst up to 9/10 Relieving factors: lying down, taking the pain meds at best a 3-4/10   OCCUPATION: Works in Scientist, research (medical), some lifting, some lifting clothes  PLOF: Independent  PATIENT GOALS less pain, better motions  OBJECTIVE:   DIAGNOSTIC FINDINGS:  Bursitis, tendonitis  PATIENT SURVEYS:  FOTO 33.75  COGNITION:  Overall cognitive status: Within functional limits for tasks assessed     SENSATION: WFL, some c/o numbness and tingling in the hand and  fingers  POSTURE: Forward head, rounded shoulders  UPPER EXTREMITY ROM:  Cervical WIO:[id number]  50% with pain and tightness in the left neck and upper trap and the shoulder  Active ROM Right eval Left eval  Shoulder flexion  94  Shoulder extension    Shoulder abduction  84  Shoulder adduction    Shoulder internal rotation  15  Shoulder external rotation  30                                  (Blank rows = not tested)all shoulder ROM very painful and guarded  UPPER EXTREMITY MMT: All motions and strength in available ROM very painful  MMT Right eval Left eval  Shoulder flexion  3+  Shoulder extension    Shoulder abduction  3+  Shoulder adduction    Shoulder internal rotation  3+  Shoulder external rotation  3+  Middle trapezius    Lower trapezius    Elbow flexion    Elbow extension    Wrist flexion    Wrist extension    Wrist ulnar deviation    Wrist radial deviation    Wrist pronation    Wrist supination    Grip strength (lbs)    (Blank rows = not tested)  SHOULDER SPECIAL TESTS:  Impingement tests: Neer impingement test: positive  and Hawkins/Kennedy impingement test: positive   Rotator cuff assessment: Drop arm test: negative  Biceps assessment:  unable tto test due to limitation in ROM and pain  PALPATION:  Very tight with spasms in the upper trap and the neck, tender here and into the shoulder, very tender biceps origin and the deltoid   TODAY'S TREATMENT:  08/04/21 AAROM flex, Ext, IR cane x10 each  Rows red 2x10 Shoulder Ext yellow 2x10 ER yellow 2x10  Bicep curls 2lb 2x10 Levator stretch 3x10  Gentle STM to upper L trap and PROM to LUE MHP/IFC left upper trap x 12 minutes   Eval MHP/IFC left upper trap x 12 minutes   PATIENT EDUCATION: Education details: see below Person educated: Patient Education method: Explanation, Media planner, and Handouts Education comprehension: verbalized understanding   HOME EXERCISE PROGRAM: Access  Code: [email or web address] URL: [email or web address] Date: 07/21/2021 Prepared by: Lum Babe  Exercises - Seated Shoulder Shrugs  - 2 x daily - 7 x weekly - 1 sets - 10 reps - 3 hold - Seated Scapular Retraction  - 2 x daily - 7 x weekly - 1 sets - 10 reps - 3 hold - Seated Shoulder Flexion Towel Slide at Table Top  - 2 x daily - 7 x weekly - 1 sets - 10 reps - 3 hold  ASSESSMENT:  CLINICAL IMPRESSION: Patient enters doing ok, Session consisted of active assisted ROM with some light strengthening. L shoulder pain present passively and passively with motions above shoulder height. L UT compensation noted active assisted flexion. Some UT tightness noted with gentle STM. Increase tissue elasticity noted after STM.      OBJECTIVE IMPAIRMENTS decreased ROM, decreased strength, increased muscle spasms, impaired flexibility, impaired UE functional use, improper body mechanics, postural dysfunction, and pain.   REHAB POTENTIAL: Good  CLINICAL DECISION MAKING: Stable/uncomplicated  EVALUATION COMPLEXITY: Low   GOALS: Goals reviewed with patient? Yes  SHORT TERM GOALS: Target date: 08/04/21  Independent with initial HEP Goal status: progressing  LONG TERM GOALS: Target date: 10/13/21  Understand posture and body mechanics Goal status: INITIAL  2.  Decrease pain overall 50% Goal status: INITIAL  3.  Increase cervical ROM 25% Goal  status: INITIAL  4.  Increase left shoulder flexion to 130 degrees Goal status: INITIAL  5.  Report no difficulty dressing and doing her hair Goal status: INITIAL   PLAN: PT FREQUENCY: 1-2x/week  PT DURATION: 12 weeks  PLANNED INTERVENTIONS: Therapeutic exercises, Therapeutic activity, Neuromuscular re-education, Balance training, Gait training, Patient/Family education, Joint manipulation, Joint mobilization, Dry Needling, Electrical stimulation, Cryotherapy, Moist heat, Taping, Vasopneumatic device, Ultrasound, and Manual therapy  PLAN  FOR NEXT SESSION: slowly start exercises modalities as needed for pain   Scot Jun, PTA 08/04/2021, 3:53 PM

## 2021-08-05 NOTE — Progress Notes (Signed)
Chief Complaint:   OBESITY Aletha is here to discuss her progress with her obesity treatment plan along with follow-up of her obesity related diagnoses. Kayzlee is on keeping a food journal and adhering to recommended goals of 1100-1200 calories and 90+ protein and states she is following her eating plan approximately 70% of the time. Alden states she is walking 10 minutes 1-2 times per week.  Today's visit was #: 12 Starting weight: 270 lbs Starting date: 02/15/2021 Today's weight: 243 lbs Today's date: 08/04/2021 Total lbs lost to date: 27 lbs Total lbs lost since last in-office visit: 6 lbs  Interim History: "I cooked at home more and learned from someone to prepare high protein and low fat low carb meals. "  Subjective:   1. Essential hypertension Review: taking medications as instructed, no medication side effects noted, no chest pain on exertion, no dyspnea on exertion, no swelling of ankles.  Petrea is asymptomatic.  She is taking Metoprolol, she is tolerating it well.  2. Other constipation She is taking Colace as needed.  She is asymptomatic, but well controlled.  She is having a BM daily.  Not needing miralax.   3. Insulin resistance Goal is HgbA1c < 5.7, fasting insulin closer to 5.   She denies any hunger or cravings.  She is not currently on any medications.  Assessment/Plan:  No orders of the defined types were placed in this encounter.   There are no discontinued medications.   No orders of the defined types were placed in this encounter.    1. Essential hypertension Hoda is working on healthy weight loss and exercise to improve blood pressure control. We will watch for signs of hypotension as she continues her lifestyle modifications.Blood pressure is at goal, continue Toprol.  Decrease salt intake, increase water and continue prudent nutritional plan and weight loss.   2. Other constipation Mileigh was informed that a decrease in bowel movement  frequency is normal while losing weight, but stools should not be hard or painful. Orders and follow up as documented in patient record.   Counseling Getting to Good Bowel Health: Your goal is to have one soft bowel movement each day. Drink at least 8 glasses of water each day. Eat plenty of fiber (goal is over 25 grams each day). It is best to get most of your fiber from dietary sources which includes leafy green vegetables, fresh fruit, and whole grains. You may need to add fiber with the help of OTC fiber supplements. These include Metamucil, Citrucel, and Flaxseed. If you are still having trouble, try adding Miralax or Magnesium Citrate. If all of these changes do not work, Cabin crew.  3. Insulin resistance Nayeliz will continue to work on weight loss, exercise, and decreasing simple carbohydrates to help decrease the risk of diabetes. Estefania agreed to follow-up with [place] as directed to closely monitor her progress.    4. Obesity with current BMI of 35.9 Daria is currently in the action stage of change. As such, her goal is to continue with weight loss efforts. She has agreed to keeping a food journal and adhering to recommended goals of 1100-1200 calories and 90+ protein.   Exercise goals: For substantial health benefits, adults should do at least 150 minutes (2 hours and 30 minutes) a week of moderate-intensity, or 75 minutes (1 hour and 15 minutes) a week of vigorous-intensity aerobic physical activity, or an equivalent combination of moderate- and vigorous-intensity aerobic activity. Aerobic activity should be performed  in episodes of at least 10 minutes, and preferably, it should be spread throughout the week.  Behavioral modification strategies: increasing lean protein intake, decreasing simple carbohydrates, and planning for success.  Janette has agreed to follow-up with our clinic in 2 weeks. She was informed of the importance of frequent follow-up visits to maximize her success  with intensive lifestyle modifications for her multiple health conditions.   Objective:   Blood pressure 110/74, pulse 64, temperature 97.7 F (36.5 C), height '5\' 9"'$  (1.753 m), weight 243 lb (110.2 kg), SpO2 98 %. Body mass index is 35.88 kg/m.  General: Cooperative, alert, well developed, in no acute distress. HEENT: Conjunctivae and lids unremarkable. Cardiovascular: Regular rhythm.  Lungs: Normal work of breathing. Neurologic: No focal deficits.   Lab Results  Component Value Date   CREATININE 0.88 07/06/2021   BUN 15 07/06/2021   NA 139 07/06/2021   K 4.5 07/06/2021   CL 101 07/06/2021   CO2 24 07/06/2021   Lab Results  Component Value Date   ALT 13 07/06/2021   AST 17 07/06/2021   ALKPHOS 80 07/06/2021   BILITOT 0.4 07/06/2021   Lab Results  Component Value Date   HGBA1C 5.2 07/06/2021   HGBA1C 5.1 02/15/2021   HGBA1C 5.1 12/29/2016   HGBA1C 5.1 07/19/2016   HGBA1C 5.4 09/25/2014   Lab Results  Component Value Date   INSULIN 6.0 07/06/2021   INSULIN 9.3 02/15/2021   Lab Results  Component Value Date   TSH 2.660 02/15/2021   Lab Results  Component Value Date   CHOL 190 07/06/2021   HDL 56 07/06/2021   LDLCALC 123 (H) 07/06/2021   TRIG 56 07/06/2021   CHOLHDL 3.4 07/06/2021   Lab Results  Component Value Date   VD25OH 74.2 07/06/2021   VD25OH 49.4 02/15/2021   Lab Results  Component Value Date   WBC 8.6 02/15/2021   HGB 14.7 02/15/2021   HCT 43.8 02/15/2021   MCV 91 02/15/2021   PLT 260 02/15/2021   Lab Results  Component Value Date   IRON 111 07/19/2020   TIBC 286 07/19/2020   FERRITIN 63 07/19/2020    Obesity Behavioral Intervention:   Approximately 15 minutes were spent on the discussion below.  ASK: We discussed the diagnosis of obesity with [name] today and Vail agreed to give [place] permission to discuss obesity behavioral modification therapy today.  ASSESS: Nili has the diagnosis of obesity and her BMI today is 35.9.  Elanor is in the action stage of change.   ADVISE: Ryann was educated on the multiple health risks of obesity as well as the benefit of weight loss to improve her health. She was advised of the need for long term treatment and the importance of lifestyle modifications to improve her current health and to decrease her risk of future health problems.  AGREE: Multiple dietary modification options and treatment options were discussed and Erlinda agreed to follow the recommendations documented in the above note.  ARRANGE: Jazlynn was educated on the importance of frequent visits to treat obesity as outlined per CMS and USPSTF guidelines and agreed to schedule her next follow up appointment today.  Attestation Statements:   Reviewed by clinician on day of visit: allergies, medications, problem list, medical history, surgical history, family history, social history, and previous encounter notes.  I, Davy Pique, RMA, am acting as Location manager for [employer], DO.   I have reviewed the above documentation for accuracy and completeness, and I agree with the above.  Marjory Sneddon, D.O.  The [place] was signed into law in 2016 which includes the topic of electronic health records.  This provides immediate access to information in MyChart.  This includes consultation notes, operative notes, office notes, lab results and pathology reports.  If you have any questions about what you read please let us know at your next visit so we can discuss your concerns and take corrective action if need be.  We are right here with you.

## 2021-08-08 ENCOUNTER — Telehealth (INDEPENDENT_AMBULATORY_CARE_PROVIDER_SITE_OTHER): Payer: PPO | Admitting: Psychology

## 2021-08-08 DIAGNOSIS — F5089 Other specified eating disorder: Secondary | ICD-10-CM

## 2021-08-08 DIAGNOSIS — F909 Attention-deficit hyperactivity disorder, unspecified type: Secondary | ICD-10-CM

## 2021-08-08 DIAGNOSIS — S60452A Superficial foreign body of right middle finger, initial encounter: Secondary | ICD-10-CM | POA: Diagnosis not present

## 2021-08-08 DIAGNOSIS — F319 Bipolar disorder, unspecified: Secondary | ICD-10-CM

## 2021-08-08 NOTE — Progress Notes (Signed)
  Office: [phone]  /  Fax: [phone]    Date: 08/22/2021   Appointment Start Time: 2:33pm Duration: 35 minutes Provider: Glennie Isle, Psy.D. Type of Session: Individual Therapy  Location of Patient: Home (private location) Location of Provider: Provider's Home (private office) Type of Contact: Telepsychological Visit via MyChart Video Visit  Session Content: [name] is a 51 y.o. female presenting for a follow-up appointment to address the previously established treatment goal of increasing coping skills.Today's appointment was a telepsychological visit. [name] provided verbal consent for today's telepsychological appointment and she is aware she is responsible for securing confidentiality on her end of the session. Prior to proceeding with today's appointment, [name]'s physical location at the time of this appointment was obtained as well a phone number she could be reached at in the event of technical difficulties. Afifa and this provider participated in today's telepsychological service.   This provider conducted a brief check-in. [name] stated she was recently in the hospital due to stroke-like symptoms, adding she is diagnosed with a "complex migraine." A risk assessment was completed. [name] denied experiencing suicidal and homicidal ideation, plan, and intent since the last appointment with this provider. She continues to acknowledge understanding regarding the importance of reaching out to trusted individuals and/or emergency resources if she is unable to ensure safety. Reviewed hunger habits since the last appointment with this provider. She acknowledged deviations from her structured meal plan during her hospitalization. Reviewed emotional and physical hunger. She noted an overall reduction in emotional/binge eating behaviors due to increased awareness. Briefly discussed triggers for emotional eating behaviors. [name] provided verbal consent during today's appointment for this  provider to send a handout about triggers via e-mail. Overall, [name] was receptive to today's appointment as evidenced by openness to sharing and responsiveness to feedback.  Mental Status Examination:  Appearance: neat Behavior: appropriate to circumstances Mood: neutral Affect: mood congruent Speech: WNL Eye Contact: appropriate Psychomotor Activity: WNL Gait: unable to assess Thought Process: linear, logical, and goal directed and denies suicidal, homicidal, and self-harm ideation, plan and intent  Thought Content/Perception: no hallucinations, delusions, bizarre thinking or behavior endorsed or observed Orientation: AAOx4 Memory/Concentration: memory, attention, language, and fund of knowledge intact  Insight: fair Judgment: fair  Interventions:  Conducted a brief chart review Conducted a risk assessment Provided empathic reflections and validation Reviewed content from the previous session Provided positive reinforcement Employed supportive psychotherapy interventions to facilitate reduced distress and to improve coping skills with identified stressors Psychoeducation provided regarding triggers for emotional eating behaviors  DSM-5 Diagnosis(es):  F50.89 Other Specified Feeding or Eating Disorder, Emotional and Binge Eating Behaviors, F90.9 Unspecified Attention-Deficit/Hyperactivity Disorder , and F31.9 Unspecified Bipolar and Related Disorder  Treatment Goal & Progress: During the initial appointment with this provider, the following treatment goal was established: increase coping skills. Progress is limited, as [name] has just begun treatment with this provider; however, she is receptive to the interaction and interventions and rapport is being established.   Plan: The next appointment is scheduled for 09/06/2021 at 11:30am, which will be via MyChart Video Visit. The next session will focus on working towards the established treatment goal.

## 2021-08-08 NOTE — Addendum Note (Signed)
Addended by: Sumner Boast on: 08/08/2021 08:50 AM   Modules accepted: Orders

## 2021-08-09 ENCOUNTER — Ambulatory Visit: Payer: PPO | Admitting: Physical Therapy

## 2021-08-10 ENCOUNTER — Ambulatory Visit: Payer: PPO | Admitting: Physical Therapy

## 2021-08-10 ENCOUNTER — Encounter: Payer: Self-pay | Admitting: Physical Therapy

## 2021-08-10 DIAGNOSIS — M25612 Stiffness of left shoulder, not elsewhere classified: Secondary | ICD-10-CM

## 2021-08-10 DIAGNOSIS — M6281 Muscle weakness (generalized): Secondary | ICD-10-CM

## 2021-08-10 DIAGNOSIS — M25512 Pain in left shoulder: Secondary | ICD-10-CM | POA: Diagnosis not present

## 2021-08-10 NOTE — Therapy (Signed)
OUTPATIENT PHYSICAL THERAPY SHOULDER TREATMENT   Patient Name: [name] MRN: [id number] DOB:09-04-1970, 51 y.o., female Today's Date: 08/10/2021   PT End of Session - 08/10/21 1059     Visit Number 3    Date for PT Re-Evaluation 10/21/21    PT Start Time 1100    PT Stop Time 1150    PT Time Calculation (min) 50 min    Activity Tolerance Patient tolerated treatment well    Behavior During Therapy [facility] for tasks assessed/performed             Past Medical History:  Diagnosis Date   Achilles tendinitis    ADHD (attention deficit hyperactivity disorder), inattentive type    Diagnosed as an adult after starting college   Allergy    Arthritis    Arthritis    Ataxia    Back pain    Bipolar I disorder 04/18/2014   most recent episode mixed   Borderline personality disorder 10/27/2011   Cataracts, bilateral    Chest pain    Chronic pain disorder    due to several injuries affecting numerous areas of her body throughout the years   Chronic paroxysmal hemicrania, 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   rt chest wall nodule removal   GANGLION CYST EXCISION  2011   lipoma removal     right bunioectomy     SHOULDER SURGERY  01/13/2011   right, partial tear   tumor resection left thigh     Patient Active Problem List   Diagnosis Date Noted   Vitamin D deficiency 07/26/2021   Eating disorder 07/26/2021   Insulin resistance 07/06/2021   Other hyperlipidemia 07/06/2021   B12 deficiency 07/06/2021   Foot sprain, left, initial encounter 06/01/2021   Mood disorder ([place]) 04/29/2021   Sleep difficulties 04/29/2021   Other constipation 04/29/2021   Essential hypertension 04/29/2021   Sprain of ankle 03/29/2021   Subacromial bursitis of left shoulder joint 03/29/2021   Concussion with no loss of consciousness 02/07/2021   Lumbar radiculopathy 02/07/2021   Shoulder impingement syndrome, left 11/01/2020   Sprain of metacarpophalangeal joint of left thumb 10/07/2020   Posterior tibial tendinitis of right lower extremity 09/13/2020   Labral tear of hip, degenerative 09/13/2020   Attention and concentration deficit 05/07/2020   Morbid obesity ([place]) 05/07/2020   Plantar wart 05/07/2020   Sleep  apnea 05/07/2020   Urinary incontinence 56/70/1410   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   Family history of genetic disease carrier    Family history of breast cancer    Family history of colon cancer    History of multiple concussions 11/20/2017   Ataxia 11/20/2017   Cervical strain, acute, initial encounter 09/19/2016   Insomnia 02/17/2015   Right shoulder pain 12/31/2014   Episodic cluster headache, not intractable 12/02/2014   Chronic paroxysmal hemicrania, 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   Photophobia of both eyes 05/04/2014   Nausea with vomiting 05/04/2014   Bipolar I disorder, most recent episode mixed ([place]) 04/18/2014   Suicidal ideation 04/12/2014    Injury of right shoulder and upper arm 02/17/2014   Migraine with status migrainosus 01/08/2013   Chronic migraine 05/08/2012   Hypertension    Contact dermatitis 11/27/2011   Generalized anxiety disorder 10/27/2011   ADHD (attention deficit hyperactivity disorder), 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   Borderline personality disorder ([place]) 10/27/2011   Right foot pain 09/28/2011   Loss of transverse plantar arch 09/01/2011   Malignant tumor of muscle ([place]) 09/02/2010   Ganglion cyst 09/29/2009   Pes planus 07/01/2008    PCP: Byrd Hesselbach  REFERRING PROVIDER: Beaulah Dinning DIAG: left shoulder bursitis, tendonitis  THERAPY DIAG:  Acute pain of left shoulder  Stiffness of left shoulder, not elsewhere classified  Muscle weakness (generalized)  Rationale for Evaluation and Treatment Rehabilitation  ONSET DATE: 05/30/21  SUBJECTIVE:                                                                                                                                                                                      SUBJECTIVE STATEMENT: "I mowed yesterday so I'm tired" General pain on the L shoulder   PERTINENT HISTORY: Bipolar, ADHD, HA's, joint pain, dizziness  PAIN:  Are you having pain? Yes: NPRS scale: 6/10 Pain location: left neck and shoulder area Pain description: tightness, ache Aggravating factors: turning head, lifting, reaching oerhead at worst up to 9/10 Relieving factors: lying down, taking the pain meds at best a 3-4/10   OCCUPATION: Works in Scientist, research (medical), some lifting, some lifting clothes  PLOF: Independent  PATIENT GOALS less pain, better motions  OBJECTIVE:   DIAGNOSTIC FINDINGS:  Bursitis, tendonitis  PATIENT SURVEYS:  FOTO 33.75  COGNITION:  Overall cognitive status: Within functional limits for tasks assessed     SENSATION: WFL, some c/o numbness and tingling in the hand and fingers  POSTURE: Forward head, rounded shoulders  UPPER  EXTREMITY ROM:  Cervical VUD:THYHOOILN 50% with pain and tightness in the left neck and  upper trap and the shoulder  Active ROM Right eval Left eval  Shoulder flexion  94  Shoulder extension    Shoulder abduction  84  Shoulder adduction    Shoulder internal rotation  15  Shoulder external rotation  30                                  (Blank rows = not tested)all shoulder ROM very painful and guarded  UPPER EXTREMITY MMT: All motions and strength in available ROM very painful  MMT Right eval Left eval  Shoulder flexion  3+  Shoulder extension    Shoulder abduction  3+  Shoulder adduction    Shoulder internal rotation  3+  Shoulder external rotation  3+  Middle trapezius    Lower trapezius    Elbow flexion    Elbow extension    Wrist flexion    Wrist extension    Wrist ulnar deviation    Wrist radial deviation    Wrist pronation    Wrist supination    Grip strength (lbs)    (Blank rows = not tested)  SHOULDER SPECIAL TESTS:  Impingement tests: Neer impingement test: positive  and Hawkins/Kennedy impingement test: positive   Rotator cuff assessment: Drop arm test: negative  Biceps assessment:  unable tto test due to limitation in ROM and pain  PALPATION:  Very tight with spasms in the upper trap and the neck, tender here and into the shoulder, very tender biceps origin and the deltoid   TODAY'S TREATMENT:  08/10/21 UBE L1 x 3 min each AAROM flex, Ext, IR cane x10 each  Rows green 2x10 Shoulder Ext green 2x10 Shoulder Flex 1lb 2x10  Shoulder Abd x10 Triceps Ext 15lb 2x10    Gentle STM to upper L trap and PROM to LUE MHP/IFC left upper trap x 12 minutes  08/04/21 AAROM flex, Ext, IR cane x10 each  Rows red 2x10 Shoulder Ext yellow 2x10 ER yellow 2x10  Bicep curls 2lb 2x10 Levator stretch 3x10  Gentle STM to upper L trap and PROM to LUE MHP/IFC left upper trap x 12 minutes   Eval MHP/IFC left upper trap x 12 minutes   PATIENT  EDUCATION: Education details: see below Person educated: Patient Education method: Explanation, Media planner, and Handouts Education comprehension: verbalized understanding   HOME EXERCISE PROGRAM: Access Code: [email or web address] URL: [email or web address] Date: 07/21/2021 Prepared by: Lum Babe  Exercises - Seated Shoulder Shrugs  - 2 x daily - 7 x weekly - 1 sets - 10 reps - 3 hold - Seated Scapular Retraction  - 2 x daily - 7 x weekly - 1 sets - 10 reps - 3 hold - Seated Shoulder Flexion Towel Slide at Table Top  - 2 x daily - 7 x weekly - 1 sets - 10 reps - 3 hold  ASSESSMENT:  CLINICAL IMPRESSION: Patient enters doing ok, Session consisted of active assisted ROM with some light strengthening. Increase resistance tolerated with rows and extensions L shoulder pain present passively and actively with motions above shoulder height. L UT compensation noted with flexion and abduction. Some UT tightness noted with gentle STM. Increase tissue elasticity noted after STM.      OBJECTIVE IMPAIRMENTS decreased ROM, decreased strength, increased muscle spasms, impaired flexibility, impaired UE functional use, improper body mechanics, postural dysfunction, and pain.   REHAB POTENTIAL: Good  CLINICAL DECISION MAKING: Stable/uncomplicated  EVALUATION COMPLEXITY: Low   GOALS: Goals  reviewed with patient? Yes  SHORT TERM GOALS: Target date: 08/04/21  Independent with initial HEP Goal status: progressing  LONG TERM GOALS: Target date: 10/13/21  Understand posture and body mechanics Goal status: progressing  2.  Decrease pain overall 50% Goal status: INITIAL  3.  Increase cervical ROM 25% Goal status: INITIAL  4.  Increase left shoulder flexion to 130 degrees Goal status: INITIAL  5.  Report no difficulty dressing and doing her hair Goal status: INITIAL   PLAN: PT FREQUENCY: 1-2x/week  PT DURATION: 12 weeks  PLANNED INTERVENTIONS: Therapeutic exercises,  Therapeutic activity, Neuromuscular re-education, Balance training, Gait training, Patient/Family education, Joint manipulation, Joint mobilization, Dry Needling, Electrical stimulation, Cryotherapy, Moist heat, Taping, Vasopneumatic device, Ultrasound, and Manual therapy  PLAN FOR NEXT SESSION: slowly start exercises modalities as needed for pain   Scot Jun, PTA 08/10/2021, 11:00 AM

## 2021-08-11 ENCOUNTER — Ambulatory Visit: Payer: PPO | Admitting: Physical Therapy

## 2021-08-11 DIAGNOSIS — M79642 Pain in left hand: Secondary | ICD-10-CM | POA: Diagnosis not present

## 2021-08-13 ENCOUNTER — Other Ambulatory Visit: Payer: Self-pay | Admitting: Neurology

## 2021-08-16 ENCOUNTER — Ambulatory Visit: Payer: Self-pay

## 2021-08-16 ENCOUNTER — Ambulatory Visit: Payer: PPO | Admitting: Physical Therapy

## 2021-08-16 ENCOUNTER — Encounter: Payer: Self-pay | Admitting: Family Medicine

## 2021-08-16 ENCOUNTER — Ambulatory Visit (INDEPENDENT_AMBULATORY_CARE_PROVIDER_SITE_OTHER): Payer: PPO | Admitting: Family Medicine

## 2021-08-16 VITALS — BP 102/70 | Ht 69.0 in | Wt 243.0 lb

## 2021-08-16 DIAGNOSIS — M5412 Radiculopathy, cervical region: Secondary | ICD-10-CM

## 2021-08-16 DIAGNOSIS — G5602 Carpal tunnel syndrome, left upper limb: Secondary | ICD-10-CM | POA: Diagnosis not present

## 2021-08-16 DIAGNOSIS — D1722 Benign lipomatous neoplasm of skin and subcutaneous tissue of left arm: Secondary | ICD-10-CM | POA: Diagnosis not present

## 2021-08-16 NOTE — Progress Notes (Signed)
[name] - 51 y.o. female MRN [id number]  Date of birth: 12-28-1970  SUBJECTIVE:  Including CC & ROS.  No chief complaint on file.   [name] is a 51 y.o. female that is presenting with acute on chronic left arm pain as well as nodules appreciated on the anterior aspect of the left volar forearm.   Review of Systems See HPI   HISTORY: Past Medical, Surgical, Social, and Family History Reviewed & Updated per EMR.   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   rt chest wall nodule removal   GANGLION CYST EXCISION  2011   lipoma removal     right bunioectomy     SHOULDER SURGERY  01/13/2011   right, partial tear   tumor resection left thigh       PHYSICAL EXAM:  VS: BP 102/70 (BP Location: Left Arm, Patient Position: Sitting)   Ht $R'5\' 9"'To$  (1.753 m)   Wt 243 lb (110.2 kg)   BMI 35.88 kg/m  Physical Exam Gen: NAD, alert, cooperative with exam, well-appearing MSK:  Neurovascularly intact    Limited ultrasound: Left forearm and wrist:  Median nerve within the carpal tunnel is normal in circumference. Median nerve followed proximally has no close association with either lipoma. Distal lipoma measures 2.99 cm x 1.10 cm in the long axis.  This measures 2.64 cm x 1.17 cm in short axis. The proximal lipoma measures 3.02 cm x 1.03 cm in long axis and 2.21 cm x 0.92 cm in the short axis.  Summary: No structural changes within the wrist and to varying sizes of lipomas appreciated in the volar forearm  Ultrasound and interpretation by Clearance Coots, MD    ASSESSMENT & PLAN:  Cervical radiculopathy Acute on chronic in nature.  MRI from 4/26 is showing left-sided nerve impingement.  Her symptoms are severe in nature on the left side. -Counseled on home exercise therapy and supportive care. -Counseled on Lyrica -Pursue epidural.  Carpal tunnel syndrome on left Acute on chronic in nature.  She appears to have a variant on ultrasound but no significant enlargement of the nerve. -Counseled on home exercise therapy and supportive care. -Referral to PM&R for nerve study.  Lipoma of left forearm Acute on chronic in nature.  She has had similar lesions removed previously.  These are benign in nature on ultrasound.  There are 2 on the volar aspect of the forearm. -Counseled on supportive care. -Has follow-up with her surgeon.

## 2021-08-16 NOTE — Assessment & Plan Note (Signed)
Acute on chronic in nature.  She appears to have a variant on ultrasound but no significant enlargement of the nerve. -Counseled on home exercise therapy and supportive care. -Referral to PM&R for nerve study.

## 2021-08-16 NOTE — Assessment & Plan Note (Addendum)
Acute on chronic in nature.  MRI from 4/26 is showing left-sided nerve impingement.  Her symptoms are severe in nature on the left side. -Counseled on home exercise therapy and supportive care. -Counseled on Lyrica -Pursue epidural.

## 2021-08-16 NOTE — Assessment & Plan Note (Signed)
Acute on chronic in nature.  She has had similar lesions removed previously.  These are benign in nature on ultrasound.  There are 2 on the volar aspect of the forearm. -Counseled on supportive care. -Has follow-up with her surgeon.

## 2021-08-17 ENCOUNTER — Emergency Department (HOSPITAL_COMMUNITY): Payer: PPO

## 2021-08-17 ENCOUNTER — Encounter: Payer: Self-pay | Admitting: Physical Therapy

## 2021-08-17 ENCOUNTER — Observation Stay (HOSPITAL_COMMUNITY)
Admission: EM | Admit: 2021-08-17 | Discharge: 2021-08-18 | Disposition: A | Discharge status: Home / Self Care | Payer: PPO | Attending: Internal Medicine | Admitting: Internal Medicine

## 2021-08-17 ENCOUNTER — Encounter (HOSPITAL_COMMUNITY): Payer: Self-pay | Admitting: Emergency Medicine

## 2021-08-17 DIAGNOSIS — R29810 Facial weakness: Secondary | ICD-10-CM | POA: Diagnosis not present

## 2021-08-17 DIAGNOSIS — F319 Bipolar disorder, unspecified: Secondary | ICD-10-CM | POA: Diagnosis not present

## 2021-08-17 DIAGNOSIS — Z8589 Personal history of malignant neoplasm of other organs and systems: Secondary | ICD-10-CM | POA: Diagnosis not present

## 2021-08-17 DIAGNOSIS — G8929 Other chronic pain: Secondary | ICD-10-CM | POA: Insufficient documentation

## 2021-08-17 DIAGNOSIS — I771 Stricture of artery: Secondary | ICD-10-CM | POA: Diagnosis not present

## 2021-08-17 DIAGNOSIS — Z79899 Other long term (current) drug therapy: Secondary | ICD-10-CM | POA: Insufficient documentation

## 2021-08-17 DIAGNOSIS — R519 Headache, unspecified: Secondary | ICD-10-CM | POA: Diagnosis not present

## 2021-08-17 DIAGNOSIS — R200 Anesthesia of skin: Secondary | ICD-10-CM | POA: Diagnosis not present

## 2021-08-17 DIAGNOSIS — R9431 Abnormal electrocardiogram [ECG] [EKG]: Secondary | ICD-10-CM | POA: Diagnosis not present

## 2021-08-17 DIAGNOSIS — F411 Generalized anxiety disorder: Secondary | ICD-10-CM | POA: Diagnosis present

## 2021-08-17 DIAGNOSIS — F603 Borderline personality disorder: Secondary | ICD-10-CM | POA: Diagnosis not present

## 2021-08-17 DIAGNOSIS — J341 Cyst and mucocele of nose and nasal sinus: Secondary | ICD-10-CM | POA: Diagnosis not present

## 2021-08-17 DIAGNOSIS — R52 Pain, unspecified: Secondary | ICD-10-CM

## 2021-08-17 DIAGNOSIS — M4802 Spinal stenosis, cervical region: Secondary | ICD-10-CM | POA: Diagnosis not present

## 2021-08-17 DIAGNOSIS — M47812 Spondylosis without myelopathy or radiculopathy, cervical region: Secondary | ICD-10-CM | POA: Diagnosis not present

## 2021-08-17 DIAGNOSIS — F900 Attention-deficit hyperactivity disorder, predominantly inattentive type: Secondary | ICD-10-CM | POA: Diagnosis present

## 2021-08-17 DIAGNOSIS — F909 Attention-deficit hyperactivity disorder, unspecified type: Secondary | ICD-10-CM | POA: Diagnosis not present

## 2021-08-17 DIAGNOSIS — F39 Unspecified mood [affective] disorder: Secondary | ICD-10-CM | POA: Diagnosis not present

## 2021-08-17 DIAGNOSIS — M542 Cervicalgia: Secondary | ICD-10-CM | POA: Diagnosis not present

## 2021-08-17 DIAGNOSIS — G43911 Migraine, unspecified, intractable, with status migrainosus: Principal | ICD-10-CM | POA: Insufficient documentation

## 2021-08-17 DIAGNOSIS — G43909 Migraine, unspecified, not intractable, without status migrainosus: Secondary | ICD-10-CM | POA: Diagnosis present

## 2021-08-17 DIAGNOSIS — R202 Paresthesia of skin: Secondary | ICD-10-CM | POA: Diagnosis not present

## 2021-08-17 DIAGNOSIS — R29818 Other symptoms and signs involving the nervous system: Secondary | ICD-10-CM | POA: Diagnosis not present

## 2021-08-17 DIAGNOSIS — I700 Atherosclerosis of aorta: Secondary | ICD-10-CM | POA: Diagnosis not present

## 2021-08-17 DIAGNOSIS — I10 Essential (primary) hypertension: Secondary | ICD-10-CM | POA: Diagnosis not present

## 2021-08-17 LAB — CBC
HCT: 43.2 % (ref 36.0–46.0)
Hemoglobin: 14.4 g/dL (ref 12.0–15.0)
MCH: 30.9 pg (ref 26.0–34.0)
MCHC: 33.3 g/dL (ref 30.0–36.0)
MCV: 92.7 fL (ref 80.0–100.0)
Platelets: 237 10*3/uL (ref 150–400)
RBC: 4.66 MIL/uL (ref 3.87–5.11)
RDW: 12.7 % (ref 11.5–15.5)
WBC: 8.3 10*3/uL (ref 4.0–10.5)
nRBC: 0 % (ref 0.0–0.2)

## 2021-08-17 LAB — COMPREHENSIVE METABOLIC PANEL
ALT: 18 U/L (ref 0–44)
AST: 18 U/L (ref 15–41)
Albumin: 4 g/dL (ref 3.5–5.0)
Alkaline Phosphatase: 64 U/L (ref 38–126)
Anion gap: 8 (ref 5–15)
BUN: 12 mg/dL (ref 6–20)
CO2: 27 mmol/L (ref 22–32)
Calcium: 9.6 mg/dL (ref 8.9–10.3)
Chloride: 103 mmol/L (ref 98–111)
Creatinine, Ser: 0.99 mg/dL (ref 0.44–1.00)
GFR, Estimated: >60 mL/min (ref >60)
Glucose, Bld: 106 mg/dL — ABNORMAL HIGH (ref 70–99)
Potassium: 4.2 mmol/L (ref 3.5–5.1)
Sodium: 138 mmol/L (ref 135–145)
Total Bilirubin: 0.5 mg/dL (ref 0.3–1.2)
Total Protein: 6.3 g/dL — ABNORMAL LOW (ref 6.5–8.1)

## 2021-08-17 LAB — I-STAT CHEM 8, ED
BUN: 13 mg/dL (ref 6–20)
Calcium, Ion: 1.24 mmol/L (ref 1.15–1.40)
Chloride: 101 mmol/L (ref 98–111)
Creatinine, Ser: 1 mg/dL (ref 0.44–1.00)
Glucose, Bld: 101 mg/dL — ABNORMAL HIGH (ref 70–99)
HCT: 44 % (ref 36.0–46.0)
Hemoglobin: 15 g/dL (ref 12.0–15.0)
Potassium: 4.3 mmol/L (ref 3.5–5.1)
Sodium: 137 mmol/L (ref 135–145)
TCO2: 25 mmol/L (ref 22–32)

## 2021-08-17 LAB — ETHANOL: Alcohol, Ethyl (B): <10 mg/dL (ref <10)

## 2021-08-17 LAB — DIFFERENTIAL
Abs Immature Granulocytes: 0.01 10*3/uL (ref 0.00–0.07)
Basophils Absolute: 0.1 10*3/uL (ref 0.0–0.1)
Basophils Relative: 1 %
Eosinophils Absolute: 0 10*3/uL (ref 0.0–0.5)
Eosinophils Relative: 1 %
Immature Granulocytes: 0 %
Lymphocytes Relative: 31 %
Lymphs Abs: 2.6 10*3/uL (ref 0.7–4.0)
Monocytes Absolute: 0.7 10*3/uL (ref 0.1–1.0)
Monocytes Relative: 8 %
Neutro Abs: 4.9 10*3/uL (ref 1.7–7.7)
Neutrophils Relative %: 59 %

## 2021-08-17 LAB — I-STAT BETA HCG BLOOD, ED (MC, WL, AP ONLY): I-stat hCG, quantitative: <5 m[IU]/mL (ref <5)

## 2021-08-17 LAB — PROTIME-INR
INR: 1 (ref 0.8–1.2)
Prothrombin Time: 12.7 seconds (ref 11.4–15.2)

## 2021-08-17 LAB — APTT: aPTT: 26 seconds (ref 24–36)

## 2021-08-17 MED ORDER — IOHEXOL 350 MG/ML SOLN
75.0000 mL | Freq: Once | INTRAVENOUS | Status: AC | PRN
  Administered 2021-08-17: 75 mL via INTRAVENOUS
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
## 2021-08-18 ENCOUNTER — Ambulatory Visit (INDEPENDENT_AMBULATORY_CARE_PROVIDER_SITE_OTHER): Payer: PPO | Admitting: Family Medicine

## 2021-08-18 ENCOUNTER — Emergency Department (HOSPITAL_COMMUNITY): Payer: PPO

## 2021-08-18 ENCOUNTER — Ambulatory Visit: Payer: PPO | Admitting: Physical Therapy

## 2021-08-18 DIAGNOSIS — F900 Attention-deficit hyperactivity disorder, predominantly inattentive type: Secondary | ICD-10-CM | POA: Diagnosis not present

## 2021-08-18 DIAGNOSIS — F411 Generalized anxiety disorder: Secondary | ICD-10-CM | POA: Diagnosis not present

## 2021-08-18 DIAGNOSIS — F603 Borderline personality disorder: Secondary | ICD-10-CM | POA: Diagnosis not present

## 2021-08-18 DIAGNOSIS — I159 Secondary hypertension, unspecified: Secondary | ICD-10-CM | POA: Diagnosis not present

## 2021-08-18 DIAGNOSIS — R202 Paresthesia of skin: Secondary | ICD-10-CM | POA: Diagnosis not present

## 2021-08-18 DIAGNOSIS — G43011 Migraine without aura, intractable, with status migrainosus: Secondary | ICD-10-CM

## 2021-08-18 DIAGNOSIS — R200 Anesthesia of skin: Secondary | ICD-10-CM | POA: Diagnosis not present

## 2021-08-18 DIAGNOSIS — M4802 Spinal stenosis, cervical region: Secondary | ICD-10-CM | POA: Diagnosis not present

## 2021-08-18 DIAGNOSIS — J341 Cyst and mucocele of nose and nasal sinus: Secondary | ICD-10-CM | POA: Diagnosis not present

## 2021-08-18 DIAGNOSIS — G43909 Migraine, unspecified, not intractable, without status migrainosus: Secondary | ICD-10-CM | POA: Diagnosis present

## 2021-08-18 LAB — CBC
HCT: 41.2 % (ref 36.0–46.0)
Hemoglobin: 13.5 g/dL (ref 12.0–15.0)
MCH: 30.7 pg (ref 26.0–34.0)
MCHC: 32.8 g/dL (ref 30.0–36.0)
MCV: 93.6 fL (ref 80.0–100.0)
Platelets: 201 10*3/uL (ref 150–400)
RBC: 4.4 MIL/uL (ref 3.87–5.11)
RDW: 12.7 % (ref 11.5–15.5)
WBC: 7.3 10*3/uL (ref 4.0–10.5)
nRBC: 0 % (ref 0.0–0.2)

## 2021-08-18 LAB — URINALYSIS, ROUTINE W REFLEX MICROSCOPIC
Bilirubin Urine: NEGATIVE
Glucose, UA: NEGATIVE mg/dL
Hgb urine dipstick: NEGATIVE
Ketones, ur: NEGATIVE mg/dL
Leukocytes,Ua: NEGATIVE
Nitrite: NEGATIVE
Protein, ur: NEGATIVE mg/dL
Specific Gravity, Urine: 1.016 (ref 1.005–1.030)
pH: 6 (ref 5.0–8.0)

## 2021-08-18 LAB — RAPID URINE DRUG SCREEN, HOSP PERFORMED
Amphetamines: POSITIVE — AB
Barbiturates: NOT DETECTED
Benzodiazepines: NOT DETECTED
Cocaine: NOT DETECTED
Opiates: NOT DETECTED
Tetrahydrocannabinol: NOT DETECTED

## 2021-08-18 LAB — CREATININE, SERUM
Creatinine, Ser: 0.85 mg/dL (ref 0.44–1.00)
GFR, Estimated: >60 mL/min (ref >60)

## 2021-08-18 LAB — HIV ANTIBODY (ROUTINE TESTING W REFLEX): HIV Screen 4th Generation wRfx: NONREACTIVE

## 2021-08-18 MED ORDER — PROCHLORPERAZINE EDISYLATE 10 MG/2ML IJ SOLN
10.0000 mg | Freq: Once | INTRAMUSCULAR | Status: AC
  Administered 2021-08-18: 10 mg via INTRAVENOUS
  Filled 2021-08-18: qty 2

## 2021-08-18 MED ORDER — HYDRALAZINE HCL 20 MG/ML IJ SOLN: 10.0000 mg | INTRAMUSCULAR | Status: DC | PRN

## 2021-08-18 MED ORDER — KETOROLAC TROMETHAMINE 30 MG/ML IJ SOLN
15.0000 mg | Freq: Once | INTRAMUSCULAR | Status: AC
  Administered 2021-08-18: 15 mg via INTRAVENOUS
  Filled 2021-08-18: qty 1

## 2021-08-18 MED ORDER — SODIUM CHLORIDE 0.9 % IV BOLUS
1000.0000 mL | Freq: Once | INTRAVENOUS | Status: AC
Start: 2021-08-18 — End: 2021-08-18
  Administered 2021-08-18: 1000 mL via INTRAVENOUS

## 2021-08-18 MED ORDER — MAGNESIUM SULFATE 2 GM/50ML IV SOLN
2.0000 g | Freq: Once | INTRAVENOUS | Status: AC
  Administered 2021-08-18: 2 g via INTRAVENOUS
  Filled 2021-08-18: qty 50

## 2021-08-18 MED ORDER — ACETAMINOPHEN 650 MG RE SUPP: 650.0000 mg | Freq: Four times a day (QID) | RECTAL | Status: DC | PRN

## 2021-08-18 MED ORDER — DIPHENHYDRAMINE HCL 50 MG/ML IJ SOLN
12.5000 mg | Freq: Once | INTRAMUSCULAR | Status: AC
  Administered 2021-08-18: 12.5 mg via INTRAVENOUS
  Filled 2021-08-18: qty 1

## 2021-08-18 MED ORDER — GADOBUTROL 1 MMOL/ML IV SOLN
10.0000 mL | Freq: Once | INTRAVENOUS | Status: AC | PRN
  Administered 2021-08-18: 10 mL via INTRAVENOUS

## 2021-08-18 MED ORDER — SODIUM CHLORIDE 0.9 % IV SOLN: INTRAVENOUS | Status: DC

## 2021-08-18 MED ORDER — BISACODYL 5 MG PO TBEC: 5.0000 mg | DELAYED_RELEASE_TABLET | Freq: Every day | ORAL | Status: DC | PRN

## 2021-08-18 MED ORDER — IPRATROPIUM-ALBUTEROL 0.5-2.5 (3) MG/3ML IN SOLN: 3.0000 mL | RESPIRATORY_TRACT | Status: DC | PRN

## 2021-08-18 MED ORDER — TRAZODONE HCL 50 MG PO TABS: 50.0000 mg | ORAL_TABLET | Freq: Every evening | ORAL | Status: DC | PRN

## 2021-08-18 MED ORDER — ENOXAPARIN SODIUM 40 MG/0.4ML IJ SOSY
40.0000 mg | PREFILLED_SYRINGE | INTRAMUSCULAR | Status: DC
Start: 2021-08-18 — End: 2021-08-19

## 2021-08-18 MED ORDER — KETOROLAC TROMETHAMINE 30 MG/ML IJ SOLN
30.0000 mg | Freq: Once | INTRAMUSCULAR | Status: AC
  Administered 2021-08-18: 30 mg via INTRAVENOUS
  Filled 2021-08-18: qty 1

## 2021-08-18 MED ORDER — PROCHLORPERAZINE MALEATE 10 MG PO TABS: 10.0000 mg | ORAL_TABLET | Freq: Four times a day (QID) | ORAL | 0 refills | Status: DC | PRN

## 2021-08-18 MED ORDER — METOPROLOL TARTRATE 5 MG/5ML IV SOLN: 5.0000 mg | INTRAVENOUS | Status: DC | PRN

## 2021-08-18 MED ORDER — METOCLOPRAMIDE HCL 5 MG/ML IJ SOLN
10.0000 mg | Freq: Once | INTRAMUSCULAR | Status: AC
  Administered 2021-08-18: 10 mg via INTRAVENOUS
  Filled 2021-08-18: qty 2

## 2021-08-18 MED ORDER — ACETAMINOPHEN 325 MG PO TABS: 650.0000 mg | ORAL_TABLET | Freq: Four times a day (QID) | ORAL | Status: DC | PRN

## 2021-08-18 MED ORDER — SENNOSIDES-DOCUSATE SODIUM 8.6-50 MG PO TABS: 1.0000 | ORAL_TABLET | Freq: Every evening | ORAL | Status: DC | PRN

## 2021-08-18 MED ORDER — GUAIFENESIN 100 MG/5ML PO LIQD: 5.0000 mL | ORAL | Status: DC | PRN
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
## 2021-08-18 NOTE — ED Notes (Signed)
RN reviewed discharge instructions w/ pt. Medication regimen reviewed, pt had no further questions.

## 2021-08-18 NOTE — ED Notes (Signed)
Patient transported to MRI 
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
## 2021-08-18 NOTE — ED Notes (Signed)
Pt alert, NAD, calm, interactive, resps e/u, speaking in clear complete sentences, eating sandwich. IV flushed, mag infusing at slower rate for comfort.

## 2021-08-18 NOTE — Discharge Instructions (Signed)
Patient may be discharged on oral migraine cocktail (25-50 mg Benadryl, 800 mg ibuprofen, 1000 mg Tylenol, 10 mg Compazine taken altogether with large amounts of water every 8 hours until headache is controlled, not to exceed 3 days for 12 doses a week to avoid development of medication overuse headache) Follow-up with outpatient specialists including the [facility] health sports medicine clinic and neurology
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
## 2021-08-18 NOTE — Progress Notes (Signed)
Night MD notified me patient has been cleared for dc from Neuro and patient wants to go home. She is now asymptomatic.  Will do her dc paperwork now for her to be discharged. Recommended Compazine sent to her pharmacy, rest of the meds are OTC - can be picked up tomorrow.   Discussed with Dr Edmund Hilda MD [facility]

## 2021-08-19 ENCOUNTER — Telehealth: Payer: Self-pay | Admitting: Physical Medicine and Rehabilitation

## 2021-08-19 NOTE — Telephone Encounter (Signed)
Patient called. Returning a call to schedule with Dr. Newton.  ?

## 2021-08-22 ENCOUNTER — Encounter: Payer: Self-pay | Admitting: Adult Health

## 2021-08-22 ENCOUNTER — Telehealth (INDEPENDENT_AMBULATORY_CARE_PROVIDER_SITE_OTHER): Payer: PPO | Admitting: Psychology

## 2021-08-22 DIAGNOSIS — F319 Bipolar disorder, unspecified: Secondary | ICD-10-CM | POA: Diagnosis not present

## 2021-08-22 DIAGNOSIS — F909 Attention-deficit hyperactivity disorder, unspecified type: Secondary | ICD-10-CM

## 2021-08-22 DIAGNOSIS — F5089 Other specified eating disorder: Secondary | ICD-10-CM

## 2021-08-22 DIAGNOSIS — Z6835 Body mass index (BMI) 35.0-35.9, adult: Secondary | ICD-10-CM | POA: Diagnosis not present

## 2021-08-22 DIAGNOSIS — I10 Essential (primary) hypertension: Secondary | ICD-10-CM | POA: Diagnosis not present

## 2021-08-22 DIAGNOSIS — G43809 Other migraine, not intractable, without status migrainosus: Secondary | ICD-10-CM | POA: Diagnosis not present

## 2021-08-22 DIAGNOSIS — Z0000 Encounter for general adult medical examination without abnormal findings: Secondary | ICD-10-CM | POA: Diagnosis not present

## 2021-08-22 DIAGNOSIS — G8929 Other chronic pain: Secondary | ICD-10-CM | POA: Diagnosis not present

## 2021-08-23 ENCOUNTER — Ambulatory Visit: Payer: PPO | Admitting: Physical Therapy

## 2021-08-23 NOTE — Progress Notes (Signed)
Office: [phone]  /  Fax: [phone]    Date: 09/06/2021   Appointment Start Time: 11:32am Duration: 36 minutes Provider: Glennie Isle, Psy.D. Type of Session: Individual Therapy  Location of Patient: Home (private location) Location of Provider: Provider's Home (private office) Type of Contact: Telepsychological Visit via MyChart Video Visit  Session Content: [name] is a 51 y.o. female presenting for a follow-up appointment to address the previously established treatment goal of increasing coping skills.Today's appointment was a telepsychological visit. [name] provided verbal consent for today's telepsychological appointment and she is aware she is responsible for securing confidentiality on her end of the session. Prior to proceeding with today's appointment, [name]'s physical location at the time of this appointment was obtained as well a phone number she could be reached at in the event of technical difficulties. [name] and this provider participated in today's telepsychological service.   This provider conducted a brief check-in. [name] stated she continues to experience a migraine, including CVA-like symptoms. She indicated her neurology provider is aware of the aforementioned and she will likely be prescribed additional medication(s). Regarding eating habits, [name] reported challenges staying within her calorie range due to her soda use. She indicated she is meeting her protein goal. Reviewed emotional and physical hunger. Further psychoeducation regarding triggers for emotional eating was provided. She was previously sent a handout and encouraged to utilize the handout between now and the next appointment to increase awareness of triggers and frequency. [name] agreed.   A risk assessment was completed. [name] denied experiencing current suicidal, self-harm and homicidal ideation, plan, and intent. However, she reported experiencing suicidal ideation ("If you have to live like this  [referring to sxs due to migraine], what's the point?") starting "several days" ago. She described the thoughts as "fleeting," noting the last time she experienced suicidal ideation was "a couple days ago." She denied experiencing suicidal plan and intent. She continues to acknowledge understanding regarding the importance of reaching out to trusted individuals and/or emergency resources if she is unable to ensure safety. Should suicidal ideation persist and/or intensify, she agreed to contact her primary therapist and also inform her friend and mother. She was agreeable to going to [facility]) if she is unable to guarantee her safety. Her confidence in her ability to reach out to trusted individuals and go to [facility] was assessed on a scale of 1-10 (1 is not confident and 10 is extremely confident). She noted her confidence as 10. Her confidence in her ability to keep herself safe between now and the next appointment with this provider (09/20/2021) was also assessed on a scale of 1-10 (1 is not confident and 10 is extremely confident). She noted her confidence as 10, noting she will be at the next appointment with this provider. To help address current pain-related concerns, she also agreed to call her neurologist after this appointment. Overall, [name] was receptive to today's appointment as evidenced by openness to sharing, responsiveness to feedback, and willingness to explore triggers for emotional eating.  Mental Status Examination:  Appearance: neat Behavior: appropriate to circumstances Mood: neutral Affect: mood congruent Speech: WNL Eye Contact: appropriate Psychomotor Activity: WNL Gait: unable to assess Thought Process: linear, logical, and goal directed and denies suicidal, homicidal, and self-harm ideation, plan and intent  Thought Content/Perception: no hallucinations, delusions, bizarre thinking or behavior endorsed or observed Orientation:  AAOx4 Memory/Concentration: memory, attention, language, and fund of knowledge intact  Insight: fair Judgment: fair  Interventions:  Conducted a brief chart review  Conducted a risk assessment Provided empathic reflections and validation Employed supportive psychotherapy interventions to facilitate reduced distress and to improve coping skills with identified stressors Psychoeducation provided regarding triggers for emotional eating behaviors  DSM-5 Diagnosis(es):  F50.89 Other Specified Feeding or Eating Disorder, Emotional and Binge Eating Behaviors, F90.9 Unspecified Attention-Deficit/Hyperactivity Disorder , and F31.9 Unspecified Bipolar and Related Disorder  Treatment Goal & Progress: During the initial appointment with this provider, the following treatment goal was established: increase coping skills. [name] has demonstrated progress in her goal as evidenced by increased awareness of hunger patterns.   Plan: The next appointment is scheduled for 09/20/2021 at 11:30am, which will be via MyChart Video Visit. The next session will focus on working towards the established treatment goal. [name] will continue meeting with her primary therapist.

## 2021-08-24 ENCOUNTER — Encounter (INDEPENDENT_AMBULATORY_CARE_PROVIDER_SITE_OTHER): Payer: Self-pay | Admitting: Family Medicine

## 2021-08-24 ENCOUNTER — Ambulatory Visit (INDEPENDENT_AMBULATORY_CARE_PROVIDER_SITE_OTHER): Payer: PPO | Admitting: Family Medicine

## 2021-08-24 ENCOUNTER — Ambulatory Visit: Payer: PPO | Admitting: Physical Therapy

## 2021-08-24 VITALS — BP 115/80 | HR 66 | Temp 98.5°F | Ht 69.0 in | Wt 242.0 lb

## 2021-08-24 DIAGNOSIS — E669 Obesity, unspecified: Secondary | ICD-10-CM

## 2021-08-24 DIAGNOSIS — I10 Essential (primary) hypertension: Secondary | ICD-10-CM

## 2021-08-24 DIAGNOSIS — M47812 Spondylosis without myelopathy or radiculopathy, cervical region: Secondary | ICD-10-CM | POA: Diagnosis not present

## 2021-08-24 DIAGNOSIS — Z6835 Body mass index (BMI) 35.0-35.9, adult: Secondary | ICD-10-CM | POA: Diagnosis not present

## 2021-08-24 DIAGNOSIS — F39 Unspecified mood [affective] disorder: Secondary | ICD-10-CM | POA: Diagnosis not present

## 2021-08-24 DIAGNOSIS — M47816 Spondylosis without myelopathy or radiculopathy, lumbar region: Secondary | ICD-10-CM | POA: Diagnosis not present

## 2021-08-24 DIAGNOSIS — M542 Cervicalgia: Secondary | ICD-10-CM | POA: Diagnosis not present

## 2021-08-25 ENCOUNTER — Ambulatory Visit: Payer: PPO | Admitting: Physical Therapy

## 2021-08-26 DIAGNOSIS — F319 Bipolar disorder, unspecified: Secondary | ICD-10-CM | POA: Diagnosis not present

## 2021-08-26 DIAGNOSIS — I10 Essential (primary) hypertension: Secondary | ICD-10-CM | POA: Diagnosis not present

## 2021-08-26 DIAGNOSIS — G43001 Migraine without aura, not intractable, with status migrainosus: Secondary | ICD-10-CM | POA: Diagnosis not present

## 2021-08-26 DIAGNOSIS — G8929 Other chronic pain: Secondary | ICD-10-CM | POA: Diagnosis not present

## 2021-08-28 NOTE — Progress Notes (Signed)
Chief Complaint:   OBESITY [name] is here to discuss her progress with her obesity treatment plan along with follow-up of her obesity related diagnoses. [name] is on keeping a food journal and adhering to recommended goals of 1100-1200 calories and 90+ grams protein and states she is following her eating plan approximately 60% of the time. [name] states she is not currently exercising.  Today's visit was #: 13 Starting weight: 270 lbs Starting date: 02/15/2021 Today's weight: 242 lbs Today's date: 08/24/2021 Total lbs lost to date: 28 Total lbs lost since last in-office visit: 1  Interim History: [name] was recently diagnosed with complex migraines, which presents with "CVA-like" symptoms. She has trouble getting meats in on hot days because she does not want to eat during those high temp days. Pt is under a lot of stress right now with her headaches.  Subjective:   1. Essential hypertension Cardiovascular ROS: no chest pain or dyspnea on exertion.  2. Mood disorder ([place]), emotional eating [name] sees psychiatrist for management. She is on Adderall, Depakote, Vistaril, Lamictal, Latuda, and trazodone.  Assessment/Plan:  No orders of the defined types were placed in this encounter.   Medications Discontinued During This Encounter  Medication Reason   pregabalin (LYRICA) 25 MG capsule      No orders of the defined types were placed in this encounter.    1. Essential hypertension At goal. [name] is working on healthy weight loss and exercise to improve blood pressure control. We will watch for signs of hypotension as she continues her lifestyle modifications. Continue meds per PCP.  2. Mood disorder ([place]), emotional eating Behavior modification techniques were discussed today to help [name] deal with her emotional/non-hunger eating behaviors.  Orders and follow up as documented in patient record. Pt is tearful at times but mood is stable. [name] denies suicidal or homicidal  ideations. I recommend pt work closely with psychiatry team for counseling to help her manage stressors.  3. Obesity, Current BMI 35.9 [name] is currently in the action stage of change. As such, her goal is to continue with weight loss efforts. She has agreed to the Category 1 Plan and keeping a food journal and adhering to recommended goals of 1100-1200 calories and 90+ grams protein.   Discussed with pt that she will focus on her mental health and work with specialists/neuro to control her migraines. She will try to just maintain for next OV.  Exercise goals:  As is  Behavioral modification strategies: no skipping meals and emotional eating strategies.  [name] has agreed to follow-up with our clinic in 3 weeks. She was informed of the importance of frequent follow-up visits to maximize her success with intensive lifestyle modifications for her multiple health conditions.   Objective:   Blood pressure 115/80, pulse 66, temperature 98.5 F (36.9 C), height '5\' 9"'$  (1.753 m), weight 242 lb (109.8 kg), SpO2 98 %. Body mass index is 35.74 kg/m.  General: Cooperative, alert, well developed, in no acute distress. HEENT: Conjunctivae and lids unremarkable. Cardiovascular: Regular rhythm.  Lungs: Normal work of breathing. Neurologic: No focal deficits.   Lab Results  Component Value Date   CREATININE 0.85 08/18/2021   BUN 13 08/17/2021   NA 137 08/17/2021   K 4.3 08/17/2021   CL 101 08/17/2021   CO2 27 08/17/2021   Lab Results  Component Value Date   ALT 18 08/17/2021   AST 18 08/17/2021   ALKPHOS 64 08/17/2021   BILITOT 0.5 08/17/2021   Lab  Results  Component Value Date   HGBA1C 5.2 07/06/2021   HGBA1C 5.1 02/15/2021   HGBA1C 5.1 12/29/2016   HGBA1C 5.1 07/19/2016   HGBA1C 5.4 09/25/2014   Lab Results  Component Value Date   INSULIN 6.0 07/06/2021   INSULIN 9.3 02/15/2021   Lab Results  Component Value Date   TSH 2.660 02/15/2021   Lab Results  Component Value Date    CHOL 190 07/06/2021   HDL 56 07/06/2021   LDLCALC 123 (H) 07/06/2021   TRIG 56 07/06/2021   CHOLHDL 3.4 07/06/2021   Lab Results  Component Value Date   VD25OH 74.2 07/06/2021   VD25OH 49.4 02/15/2021   Lab Results  Component Value Date   WBC 7.3 08/18/2021   HGB 13.5 08/18/2021   HCT 41.2 08/18/2021   MCV 93.6 08/18/2021   PLT 201 08/18/2021   Lab Results  Component Value Date   IRON 111 07/19/2020   TIBC 286 07/19/2020   FERRITIN 63 07/19/2020     Attestation Statements:   Reviewed by clinician on day of visit: allergies, medications, problem list, medical history, surgical history, family history, social history, and previous encounter notes.  Time spent on visit including pre-visit chart review and post-visit care and charting was 20 minutes.   I, Kathlene November, BS, CMA, am acting as transcriptionist for [employer], DO.  I have reviewed the above documentation for accuracy and completeness, and I agree with the above. Marjory Sneddon, D.O.  The [place] was signed into law in 2016 which includes the topic of electronic health records.  This provides immediate access to information in MyChart.  This includes consultation notes, operative notes, office notes, lab results and pathology reports.  If you have any questions about what you read please let us know at your next visit so we can discuss your concerns and take corrective action if need be.  We are right here with you.

## 2021-08-29 ENCOUNTER — Ambulatory Visit (INDEPENDENT_AMBULATORY_CARE_PROVIDER_SITE_OTHER): Payer: PPO | Admitting: Adult Health

## 2021-08-29 ENCOUNTER — Ambulatory Visit: Payer: PPO | Admitting: Family Medicine

## 2021-08-29 ENCOUNTER — Encounter: Payer: Self-pay | Admitting: Adult Health

## 2021-08-29 VITALS — BP 131/85 | HR 67 | Ht 69.0 in | Wt 246.2 lb

## 2021-08-29 DIAGNOSIS — G43019 Migraine without aura, intractable, without status migrainosus: Secondary | ICD-10-CM
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
## 2021-08-30 ENCOUNTER — Telehealth: Payer: Self-pay | Admitting: Adult Health

## 2021-08-30 NOTE — Telephone Encounter (Signed)
Called patient [name] to call back. I need to speak with her about treatment plan

## 2021-08-30 NOTE — Telephone Encounter (Signed)
Called patient again. LVM on cell. Called home spoke to her mother who said she would have her call me

## 2021-09-01 ENCOUNTER — Ambulatory Visit (INDEPENDENT_AMBULATORY_CARE_PROVIDER_SITE_OTHER): Payer: PPO | Admitting: Family Medicine

## 2021-09-04 ENCOUNTER — Encounter: Payer: Self-pay | Admitting: Adult Health

## 2021-09-05 NOTE — Telephone Encounter (Signed)
Pt last seen and was mentioned.  This is your assessment notes:  Advised patient to go ahead and take her monthly Emgality injection Discussed plan of care with Dr. Brett Fairy.  Certainly symptoms in the left arm could be from the neck however this would not explain change in sensation in the face.  Physical exam today is unremarkable.  Advised patient that if Emgality does not help her headache and subsequent tingling in the face and arm does not resolve we potentially could add on verapamil.  Patient will let us know how she is doing in 1 week. Advised that if her symptoms worsen or she develops any strokelike symptoms she should go to the emergency room She will avoid using Imitrex due to complex migraine Follow-up in 3 to 4 months or sooner if needed

## 2021-09-06 ENCOUNTER — Telehealth: Payer: Self-pay | Admitting: Adult Health

## 2021-09-06 ENCOUNTER — Telehealth (INDEPENDENT_AMBULATORY_CARE_PROVIDER_SITE_OTHER): Payer: PPO | Admitting: Psychology

## 2021-09-06 ENCOUNTER — Encounter: Payer: Self-pay | Admitting: *Deleted

## 2021-09-06 DIAGNOSIS — F909 Attention-deficit hyperactivity disorder, unspecified type: Secondary | ICD-10-CM

## 2021-09-06 DIAGNOSIS — F5089 Other specified eating disorder: Secondary | ICD-10-CM

## 2021-09-06 DIAGNOSIS — F319 Bipolar disorder, unspecified: Secondary | ICD-10-CM

## 2021-09-06 NOTE — Telephone Encounter (Signed)
[name] NP responded to patient yesterday 8/7 and patient responded again late in the afternoon. The message is pending for [facility] NP to review.

## 2021-09-06 NOTE — Telephone Encounter (Signed)
Pt checking if nurse has responded to MyChart message sent August 6. Would like a call from the nurse.

## 2021-09-07 ENCOUNTER — Encounter (INDEPENDENT_AMBULATORY_CARE_PROVIDER_SITE_OTHER): Payer: Self-pay

## 2021-09-07 DIAGNOSIS — M79602 Pain in left arm: Secondary | ICD-10-CM | POA: Diagnosis not present

## 2021-09-07 DIAGNOSIS — M542 Cervicalgia: Secondary | ICD-10-CM | POA: Diagnosis not present

## 2021-09-07 NOTE — Telephone Encounter (Signed)
Dr. Brett Fairy,   We had recommended adding on verapamil since the patient now has strokelike symptoms with her migraines.  However she is on Toprol XL and the heart rate in the last office visit was 67.  Would you like her  to start verapamil or consider another medication?

## 2021-09-08 DIAGNOSIS — H5310 Unspecified subjective visual disturbances: Secondary | ICD-10-CM | POA: Diagnosis not present

## 2021-09-08 DIAGNOSIS — H5713 Ocular pain, bilateral: Secondary | ICD-10-CM | POA: Diagnosis not present

## 2021-09-12 DIAGNOSIS — M79642 Pain in left hand: Secondary | ICD-10-CM | POA: Diagnosis not present

## 2021-09-14 ENCOUNTER — Encounter: Payer: Self-pay | Admitting: Family Medicine

## 2021-09-14 ENCOUNTER — Ambulatory Visit: Payer: Self-pay

## 2021-09-14 ENCOUNTER — Ambulatory Visit: Payer: PPO | Admitting: Family Medicine

## 2021-09-14 VITALS — Ht 69.0 in | Wt 246.0 lb

## 2021-09-14 DIAGNOSIS — M7542 Impingement syndrome of left shoulder: Secondary | ICD-10-CM | POA: Diagnosis not present

## 2021-09-14 DIAGNOSIS — G43801 Other migraine, not intractable, with status migrainosus: Secondary | ICD-10-CM

## 2021-09-14 MED ORDER — PROCHLORPERAZINE MALEATE 10 MG PO TABS: 10.0000 mg | ORAL_TABLET | Freq: Four times a day (QID) | ORAL | 0 refills | Status: DC | PRN

## 2021-09-14 MED ORDER — METHYLPREDNISOLONE ACETATE 40 MG/ML IJ SUSP
40.0000 mg | Freq: Once | INTRAMUSCULAR | Status: AC
  Administered 2021-09-14: 40 mg via INTRAMUSCULAR

## 2021-09-14 MED ORDER — KETOROLAC TROMETHAMINE 30 MG/ML IJ SOLN
30.0000 mg | Freq: Once | INTRAMUSCULAR | Status: AC
  Administered 2021-09-14: 30 mg via INTRAMUSCULAR

## 2021-09-14 NOTE — Assessment & Plan Note (Signed)
Acute on chronic in nature.  Pain is more occurring with abduction. -Counseled on home exercise therapy and supportive care. -.injection  -Could consider shockwave therapy

## 2021-09-14 NOTE — Progress Notes (Signed)
[name] - 51 y.o. female MRN [id number]  Date of birth: 11-25-1970  SUBJECTIVE:  Including CC & ROS.  No chief complaint on file.   [name] is a 51 y.o. female that is presenting with acute on chronic left shoulder pain.  She is having pain with abduction.  No injury inciting event.   Review of Systems See HPI   HISTORY: Past Medical, Surgical, Social, and Family History Reviewed & Updated per EMR.   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   rt chest wall nodule removal   GANGLION CYST EXCISION  2011   lipoma removal     right bunioectomy     SHOULDER SURGERY  01/13/2011   right, partial tear   tumor resection left thigh       PHYSICAL EXAM:  VS: Ht 5' 9"  (1.753 m)   Wt 246 lb (111.6 kg)   BMI 36.33 kg/m  Physical Exam Gen: NAD, alert, cooperative with exam, well-appearing MSK:  Neurovascularly intact     Aspiration/Injection Procedure Note [name] 12/05/1970  Procedure: Injection Indications: Left shoulder pain  Procedure Details Consent: Risks of procedure as well as the alternatives and risks of each were explained to the (patient/caregiver).  Consent for procedure obtained. Time Out: Verified patient identification, verified procedure, site/side was marked, verified correct patient position, special equipment/implants available, medications/allergies/relevent history reviewed, required imaging and test results available.  Performed.  The area was cleaned with iodine and alcohol swabs.    The left subacromial space was injected using 1 cc's of 40 mg Depo-Medrol and 4 cc's of 0.25% bupivacaine with a 22 1 1/2" needle.  Ultrasound was used. Images were obtained in long views showing the  injection.     A sterile dressing was applied.  Patient did tolerate procedure well.     ASSESSMENT & PLAN:   Migraine with status migrainosus Acute on chronic in nature.  She has been having ongoing headache for several weeks. -Counseled on home exercise therapy and supportive care. -IM Toradol. -Compazine.  Shoulder impingement syndrome, left Acute on chronic in nature.  Pain is more occurring with abduction. -Counseled on home exercise therapy and supportive care. -.injection  -Could consider shockwave therapy

## 2021-09-14 NOTE — Patient Instructions (Signed)
Good to see you Please use heat  Please continue the exercises   Please send me a message in MyChart with any questions or updates.  Please see me back in 4 weeks or as needed if better.   --Dr. Raeford Razor

## 2021-09-14 NOTE — Assessment & Plan Note (Signed)
Acute on chronic in nature.  She has been having ongoing headache for several weeks. -Counseled on home exercise therapy and supportive care. -IM Toradol. -Compazine.

## 2021-09-15 ENCOUNTER — Other Ambulatory Visit: Payer: Self-pay | Admitting: Family Medicine

## 2021-09-15 ENCOUNTER — Telehealth: Payer: Self-pay | Admitting: *Deleted

## 2021-09-15 NOTE — Telephone Encounter (Signed)
Answered questions.   Rosemarie Ax, MD [facility] 09/15/2021, 5:11 PM

## 2021-09-15 NOTE — Telephone Encounter (Signed)
Patient called- states her shoulder hurts worse today than it did before her visit on 09/14/21. She states she can barely lift her arm today.   She is wanting to know what else she can do for her pain and a OOW note for 1-2 days.

## 2021-09-20 ENCOUNTER — Telehealth (INDEPENDENT_AMBULATORY_CARE_PROVIDER_SITE_OTHER): Payer: PPO | Admitting: Psychology

## 2021-09-20 DIAGNOSIS — F909 Attention-deficit hyperactivity disorder, unspecified type: Secondary | ICD-10-CM | POA: Diagnosis not present

## 2021-09-20 DIAGNOSIS — F319 Bipolar disorder, unspecified: Secondary | ICD-10-CM

## 2021-09-20 DIAGNOSIS — F5089 Other specified eating disorder: Secondary | ICD-10-CM

## 2021-09-20 MED ORDER — VERAPAMIL HCL ER 120 MG PO TBCR: 120.0000 mg | EXTENDED_RELEASE_TABLET | Freq: Every day | ORAL | 5 refills | Status: DC

## 2021-09-20 NOTE — Progress Notes (Signed)
Office: [phone]  /  Fax: [phone]    Date: September 20, 2021    Appointment Start Time: 11:32am Duration: 35 minutes Provider: Glennie Isle, Psy.D. Type of Session: Individual Therapy  Location of Patient: Home (private location) Location of Provider: Provider's Home (private office) Type of Contact: Telepsychological Visit via MyChart Video Visit  Session Content: [name] is a 51 y.o. female presenting for a follow-up appointment to address the previously established treatment goal of increasing coping skills.Today's appointment was a telepsychological visit. [name] provided verbal consent for today's telepsychological appointment and she is aware she is responsible for securing confidentiality on her end of the session. Prior to proceeding with today's appointment, [name]'s physical location at the time of this appointment was obtained as well a phone number she could be reached at in the event of technical difficulties. [name] and this provider participated in today's telepsychological service.   This provider conducted a brief check-in. [name] shared about recent events. She denied experiencing suicidal ideation since the last visit with this provider, but noted experiencing "depression and nervousness." She continues to acknowledge understanding regarding the importance of reaching out to trusted individuals and/or emergency resources if she is unable to ensure safety. Notably, [name] shared she met with her friend after the last appointment with his provider, adding she informed her friend that she was experiencing suicidal ideation and described the conversation with her friend as helpful. Session then focused on exploring and processing depression and nervousness. [name] explained she will be having surgery tomorrow for her arm. To assist with coping, psychoeducation was provided regarding grounding techniques. She was engaged in a grounding exercise, and her experience was processed.  She described the exercise as "awesome" and was observed smiling. [name] provided verbal consent during today's appointment for this provider to send a handout for the exercise via e-mail. This provider further discussed the benefit of using this exercise when triggered to engage in emotional/binge behaviors. She was receptive and discussed recently having more awareness as it relates to her eating habits, which has helped reduce emotional/binge eating behaviors. As it relates to eating habits while recovering, she was encouraged to obtain foods today that can be easily prepared (e.g., frozen meals congruent to her structured meal plan). Overall, [name] was receptive to today's appointment as evidenced by openness to sharing, responsiveness to feedback, and willingness to continue engaging in learned skills.  Mental Status Examination:  Appearance: neat Behavior: appropriate to circumstances Mood: neutral Affect: mood congruent Speech: WNL Eye Contact: appropriate Psychomotor Activity: WNL Gait: unable to assess Thought Process: linear, logical, and goal directed and denies suicidal, homicidal, and self-harm ideation, plan and intent  Thought Content/Perception: no hallucinations, delusions, bizarre thinking or behavior endorsed or observed Orientation: AAOx4 Memory/Concentration: memory, attention, language, and fund of knowledge intact  Insight: good Judgment: fair  Interventions:  Conducted a brief chart review Conducted a risk assessment Provided empathic reflections and validation Employed supportive psychotherapy interventions to facilitate reduced distress and to improve coping skills with identified stressors Psychoeducation provided regarding grounding techniques Engaged pt in a grounding exercise  DSM-5 Diagnosis(es):  F50.89 Other Specified Feeding or Eating Disorder, Emotional and Binge Eating Behaviors, F90.9 Unspecified Attention-Deficit/Hyperactivity Disorder , and F31.9  Unspecified Bipolar and Related Disorder  Treatment Goal & Progress: During the initial appointment with this provider, the following treatment goal was established: increase coping skills. [name] has demonstrated progress in her goal as evidenced by increased awareness of hunger patterns, reduction in emotional eating behaviors, and reduction in binge  eating behaviors. [name] also continues to demonstrate willingness to engage in learned skill(s).  Plan: The next appointment is scheduled for 10/04/2021 at 11am, which will be via MyChart Video Visit. The next session will focus on working towards the established treatment goal. [name] will continue meeting with her primary therapist.

## 2021-09-20 NOTE — Addendum Note (Signed)
Addended by: Trudie Buckler on: 09/20/2021 04:15 PM   Modules accepted: Orders

## 2021-09-21 ENCOUNTER — Ambulatory Visit (INDEPENDENT_AMBULATORY_CARE_PROVIDER_SITE_OTHER): Payer: PPO | Admitting: Nurse Practitioner

## 2021-09-21 ENCOUNTER — Other Ambulatory Visit: Payer: Self-pay

## 2021-09-21 DIAGNOSIS — R2232 Localized swelling, mass and lump, left upper limb: Secondary | ICD-10-CM | POA: Diagnosis not present

## 2021-09-21 DIAGNOSIS — D1722 Benign lipomatous neoplasm of skin and subcutaneous tissue of left arm: Secondary | ICD-10-CM | POA: Diagnosis not present

## 2021-09-24 ENCOUNTER — Other Ambulatory Visit: Payer: Self-pay | Admitting: Family Medicine

## 2021-09-26 ENCOUNTER — Other Ambulatory Visit: Payer: Self-pay | Admitting: Adult Health

## 2021-09-27 ENCOUNTER — Ambulatory Visit (INDEPENDENT_AMBULATORY_CARE_PROVIDER_SITE_OTHER): Payer: PPO | Admitting: Nurse Practitioner

## 2021-09-27 ENCOUNTER — Encounter (INDEPENDENT_AMBULATORY_CARE_PROVIDER_SITE_OTHER): Payer: Self-pay | Admitting: Nurse Practitioner

## 2021-09-27 ENCOUNTER — Other Ambulatory Visit: Payer: Self-pay | Admitting: Adult Health

## 2021-09-27 VITALS — BP 107/56 | HR 66 | Temp 98.1°F | Ht 69.0 in | Wt 241.0 lb

## 2021-09-27 DIAGNOSIS — Z6835 Body mass index (BMI) 35.0-35.9, adult: Secondary | ICD-10-CM | POA: Diagnosis not present

## 2021-09-27 DIAGNOSIS — E669 Obesity, unspecified: Secondary | ICD-10-CM

## 2021-09-27 DIAGNOSIS — E7849 Other hyperlipidemia: Secondary | ICD-10-CM | POA: Diagnosis not present

## 2021-10-04 ENCOUNTER — Telehealth (INDEPENDENT_AMBULATORY_CARE_PROVIDER_SITE_OTHER): Payer: PPO | Admitting: Psychology

## 2021-10-04 DIAGNOSIS — F909 Attention-deficit hyperactivity disorder, unspecified type: Secondary | ICD-10-CM | POA: Diagnosis not present

## 2021-10-04 DIAGNOSIS — F319 Bipolar disorder, unspecified: Secondary | ICD-10-CM | POA: Diagnosis not present

## 2021-10-04 DIAGNOSIS — F5089 Other specified eating disorder: Secondary | ICD-10-CM | POA: Diagnosis not present

## 2021-10-04 NOTE — Progress Notes (Signed)
Chief Complaint:   OBESITY [name] is here to discuss her progress with her obesity treatment plan along with follow-up of her obesity related diagnoses. [name] is on keeping a food journal and adhering to recommended goals of [id number] calories and 90 protein and states she is following her eating plan approximately 60% of the time. [name] states she is not exercising.   Today's visit was #: 14 Starting weight: 270 lbs Starting date: 02/15/2021 Today's weight: 241 lbs Today's date: 09/27/2021 Total lbs lost to date: 29 lbs Total lbs lost since last in-office visit: 1 lb  Interim History: Had surgery on left arm one week ago.  Uses my fitness pal to track, but gets frustrated.  Calories 1200-1450, protein 60 grams.  Denies hunger.  Not a big meat eater.  Struggling with meeting protein goals.  Drinking Dr Owens Shark sodas, water, protein shakes.  Craves soda.    Subjective:   1. Other hyperlipidemia She has never been on medications.  Taking OTC Omega.  Family history:  mother   Assessment/Plan:   1. Other hyperlipidemia Cardiovascular risk and specific lipid/LDL goals reviewed.  We discussed several lifestyle modifications today and [name] will continue to work on diet, exercise and weight loss efforts. Orders and follow up as documented in patient record.   Counseling Intensive lifestyle modifications are the first line treatment for this issue. Dietary changes: Increase soluble fiber. Decrease simple carbohydrates. Exercise changes: Moderate to vigorous-intensity aerobic activity 150 minutes per week if tolerated. Lipid-lowering medications: see documented in medical record.  2. Obesity, Current BMI 35.7 [name] is currently in the action stage of change. As such, her goal is to continue with weight loss efforts. She has agreed to keeping a food journal and adhering to recommended goals of [phone] calories and 90+ protein.   Exercise goals:  as is.   Behavioral modification  strategies: increasing lean protein intake, increasing vegetables, and increasing water intake.  [name] has agreed to follow-up with our clinic in 2 weeks. She was informed of the importance of frequent follow-up visits to maximize her success with intensive lifestyle modifications for her multiple health conditions.   Objective:   Blood pressure (!) 107/56, pulse 66, temperature 98.1 F (36.7 C), height '5\' 9"'$  (1.753 m), weight 241 lb (109.3 kg), SpO2 98 %. Body mass index is 35.59 kg/m.  General: Cooperative, alert, well developed, in no acute distress. HEENT: Conjunctivae and lids unremarkable. Cardiovascular: Regular rhythm.  Lungs: Normal work of breathing. Neurologic: No focal deficits.   Lab Results  Component Value Date   CREATININE 0.85 08/18/2021   BUN 13 08/17/2021   NA 137 08/17/2021   K 4.3 08/17/2021   CL 101 08/17/2021   CO2 27 08/17/2021   Lab Results  Component Value Date   ALT 18 08/17/2021   AST 18 08/17/2021   ALKPHOS 64 08/17/2021   BILITOT 0.5 08/17/2021   Lab Results  Component Value Date   HGBA1C 5.2 07/06/2021   HGBA1C 5.1 02/15/2021   HGBA1C 5.1 12/29/2016   HGBA1C 5.1 07/19/2016   HGBA1C 5.4 09/25/2014   Lab Results  Component Value Date   INSULIN 6.0 07/06/2021   INSULIN 9.3 02/15/2021   Lab Results  Component Value Date   TSH 2.660 02/15/2021   Lab Results  Component Value Date   CHOL 190 07/06/2021   HDL 56 07/06/2021   LDLCALC 123 (H) 07/06/2021   TRIG 56 07/06/2021   CHOLHDL 3.4 07/06/2021   Lab Results  Component Value Date   VD25OH 74.2 07/06/2021   VD25OH 49.4 02/15/2021   Lab Results  Component Value Date   WBC 7.3 08/18/2021   HGB 13.5 08/18/2021   HCT 41.2 08/18/2021   MCV 93.6 08/18/2021   PLT 201 08/18/2021   Lab Results  Component Value Date   IRON 111 07/19/2020   TIBC 286 07/19/2020   FERRITIN 63 07/19/2020    Obesity Behavioral Intervention:   Approximately 15 minutes were spent on the  discussion below.  ASK: We discussed the diagnosis of obesity with [name] today and [name] agreed to give [place] permission to discuss obesity behavioral modification therapy today.  ASSESS: [name] has the diagnosis of obesity and her BMI today is 35.7. [name] is in the action stage of change.   ADVISE: [name] was educated on the multiple health risks of obesity as well as the benefit of weight loss to improve her health. She was advised of the need for long term treatment and the importance of lifestyle modifications to improve her current health and to decrease her risk of future health problems.  AGREE: Multiple dietary modification options and treatment options were discussed and [name] agreed to follow the recommendations documented in the above note.  ARRANGE: [name] was educated on the importance of frequent visits to treat obesity as outlined per CMS and USPSTF guidelines and agreed to schedule her next follow up appointment today.  Attestation Statements:   Reviewed by clinician on day of visit: allergies, medications, problem list, medical history, surgical history, family history, social history, and previous encounter notes.  Time spent on visit including pre-visit chart review and post-visit care and charting was 30 minutes.   I, Davy Pique, RMA, am acting as transcriptionist for Everardo Pacific, FNP  I have reviewed the above documentation for accuracy and completeness, and I agree with the above. Everardo Pacific, FNP

## 2021-10-04 NOTE — Progress Notes (Signed)
Office: [phone]  /  Fax: [phone]    Date: October 04, 2021    Appointment Start Time: 11:02am Duration: 33 minutes Provider: Glennie Isle, Psy.D. Type of Session: Individual Therapy  Location of Patient: Home (private location) Location of Provider: Provider's Home (private office) Type of Contact: Telepsychological Visit via MyChart Video Visit  Session Content: [name] is a 51 y.o. female presenting for a follow-up appointment to address the previously established treatment goal of increasing coping skills.Today's appointment was a telepsychological visit. [name] provided verbal consent for today's telepsychological appointment and she is aware she is responsible for securing confidentiality on her end of the session. Prior to proceeding with today's appointment, [name]'s physical location at the time of this appointment was obtained as well a phone number she could be reached at in the event of technical difficulties. [name] and this provider participated in today's telepsychological service.   This provider conducted a brief check-in. [name] shared about recent events, including a recent surgery. A risk assessment was completed. [name] denied experiencing suicidal and homicidal ideation, plan, and intent since the last appointment with this provider. She continues to acknowledge understanding regarding the importance of reaching out to trusted individuals and/or emergency resources if she is unable to ensure safety. Reviewed recent eating habits. She discussed checking labels when making choices at the grocery store, adding she is journaling by writing what she eats down. [name] noted a reduction in emotional and binge eating behaviors. Moreover, she reported challenges with consuming regular sodas. Thus, she was engaged in problem solving to reduce intake (e.g., buying smaller bottles; only placing one bottle in the refrigerator per day; taking smaller bottle of soda to work vs.  buying at the vending machine). She was receptive to the aforementioned as evidenced by her stating, "That sounds good." She was also observed writing down the aforementioned.Session then focused on measuring success with goal(s) with the clinic aside from just the number on the scale (e.g., clothes fitting better, reduction in emotional/binge eating behaviors, feeling more in control). She was encouraged to write down the aforementioned for future reference. She agreed and was observed writing. Overall, [name] was receptive to today's appointment as evidenced by openness to sharing, responsiveness to feedback, and willingness to implement discussed strategies .  Mental Status Examination:  Appearance: neat Behavior: appropriate to circumstances Mood: neutral Affect: mood congruent Speech: WNL Eye Contact: appropriate Psychomotor Activity: WNL Gait: unable to assess Thought Process: linear, logical, and goal directed and denies suicidal, homicidal, and self-harm ideation, plan and intent  Thought Content/Perception: no hallucinations, delusions, bizarre thinking or behavior endorsed or observed Orientation: AAOx4 Memory/Concentration: memory, attention, language, and fund of knowledge intact  Insight: good Judgment: fair  Interventions:  Conducted a brief chart review Conducted a risk assessment Provided empathic reflections and validation Reviewed content from the previous session Employed supportive psychotherapy interventions to facilitate reduced distress and to improve coping skills with identified stressors Engaged patient in problem solving  DSM-5 Diagnosis(es):  F50.89 Other Specified Feeding or Eating Disorder, Emotional and Binge Eating Behaviors, F90.9 Unspecified Attention-Deficit/Hyperactivity Disorder , and F31.9 Unspecified Bipolar and Related Disorder  Treatment Goal & Progress: During the initial appointment with this provider, the following treatment goal was  established: increase coping skills. [name] has demonstrated progress in her goal as evidenced by increased awareness of hunger patterns, increased awareness of triggers for emotional eating behaviors, and reduction in emotional and binge eating behaviors. [name] also continues to demonstrate willingness to engage in learned skill(s).  Plan:  The next appointment is scheduled for 10/17/2021 at 11am, which will be via MyChart Video Visit. The next session will focus on working towards the established treatment goal. [name] will continue with her primary therapist.

## 2021-10-11 ENCOUNTER — Ambulatory Visit (INDEPENDENT_AMBULATORY_CARE_PROVIDER_SITE_OTHER): Payer: PPO | Admitting: Nurse Practitioner

## 2021-10-11 ENCOUNTER — Ambulatory Visit (INDEPENDENT_AMBULATORY_CARE_PROVIDER_SITE_OTHER): Payer: PPO | Admitting: Family Medicine

## 2021-10-11 ENCOUNTER — Encounter (INDEPENDENT_AMBULATORY_CARE_PROVIDER_SITE_OTHER): Payer: Self-pay | Admitting: Nurse Practitioner

## 2021-10-11 VITALS — BP 105/74 | HR 66 | Temp 98.2°F | Ht 69.0 in | Wt 240.0 lb

## 2021-10-11 DIAGNOSIS — E669 Obesity, unspecified: Secondary | ICD-10-CM

## 2021-10-11 DIAGNOSIS — I10 Essential (primary) hypertension: Secondary | ICD-10-CM | POA: Diagnosis not present

## 2021-10-11 DIAGNOSIS — Z6835 Body mass index (BMI) 35.0-35.9, adult: Secondary | ICD-10-CM | POA: Diagnosis not present

## 2021-10-12 ENCOUNTER — Ambulatory Visit (INDEPENDENT_AMBULATORY_CARE_PROVIDER_SITE_OTHER): Payer: PPO

## 2021-10-12 ENCOUNTER — Encounter: Payer: Self-pay | Admitting: Family Medicine

## 2021-10-12 ENCOUNTER — Ambulatory Visit: Payer: PPO | Admitting: Podiatry

## 2021-10-12 ENCOUNTER — Ambulatory Visit: Payer: PPO | Admitting: Family Medicine

## 2021-10-12 DIAGNOSIS — M7751 Other enthesopathy of right foot: Secondary | ICD-10-CM | POA: Diagnosis not present

## 2021-10-12 DIAGNOSIS — M205X1 Other deformities of toe(s) (acquired), right foot: Secondary | ICD-10-CM

## 2021-10-12 DIAGNOSIS — M21619 Bunion of unspecified foot: Secondary | ICD-10-CM

## 2021-10-12 DIAGNOSIS — M21611 Bunion of right foot: Secondary | ICD-10-CM

## 2021-10-12 DIAGNOSIS — M7552 Bursitis of left shoulder: Secondary | ICD-10-CM | POA: Diagnosis not present

## 2021-10-12 MED ORDER — TRIAMCINOLONE ACETONIDE 10 MG/ML IJ SUSP
10.0000 mg | Freq: Once | INTRAMUSCULAR | Status: AC
  Administered 2021-10-12: 10 mg

## 2021-10-12 NOTE — Assessment & Plan Note (Signed)
Has done well since the injection.  She still noticed some catching and soreness from time to time. -Counseled on home exercise therapy and supportive care. -Could consider shockwave therapy

## 2021-10-12 NOTE — Progress Notes (Signed)
  [name] - 51 y.o. female MRN [id number]  Date of birth: 11/29/1970  SUBJECTIVE:  Including CC & ROS.  No chief complaint on file.   [name] is a 51 y.o. female that is following up for her left shoulder.  She has been doing well since the injection.  Still notices some soreness from time to time.  Recently had to lipomas removed on the anterior forearm.    Review of Systems See HPI   HISTORY: Past Medical, Surgical, Social, and Family History Reviewed & Updated per EMR.   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   rt chest wall nodule removal   GANGLION CYST EXCISION  2011   lipoma removal     right bunioectomy     SHOULDER SURGERY  01/13/2011   right, partial tear   tumor resection left thigh       PHYSICAL EXAM:  VS: BP 122/82 (BP Location: Left Arm, Patient Position: Sitting)   Ht $R'5\' 9"'ZR$  (1.753 m)   Wt 240 lb (108.9 kg)   BMI 35.44 kg/m  Physical Exam Gen: NAD, alert, cooperative with exam, well-appearing MSK:  Neurovascularly intact       ASSESSMENT & PLAN:   Subacromial bursitis of left shoulder joint Has done well since the injection.  She still noticed some catching and soreness from time to time. -Counseled on home exercise therapy and supportive care. -Could consider shockwave therapy

## 2021-10-12 NOTE — Progress Notes (Signed)
Subjective:   Patient ID: [name], female   DOB: 51 y.o.   MRN: [id number]   HPI Patient presents stating over the last few weeks she has developed a lot of pain in her right big toe joint and does not note any injury or recent   ROS      Objective:  Physical Exam  Neurovascular status intact with inflammation pain around the right first MPJ and slight crepitus within the joint which she states is only been present over the last few weeks     Assessment:  Inflammatory capsulitis of the right first MPJ which may have been acute in nature     Plan:  H&P reviewed condition and went ahead and reviewed x-rays.  Did do sterile prep injected around the first MPJ periarticular 3 mg Kenalog 5 mg Xylocaine advised on rigid bottom shoes and reappoint to recheck may require other treatment depending on response  X-rays indicate fixation is in place with joint appearing congruence and no indications of pathology currently from an x-ray perspective

## 2021-10-13 NOTE — Progress Notes (Signed)
Chief Complaint:   OBESITY [name] is here to discuss her progress with her obesity treatment plan along with follow-up of her obesity related diagnoses. [name] is on keeping a food journal and adhering to recommended goals of 1100-1200 calories and 90 grams of protein and states she is following her eating plan approximately 60% of the time. [name] states she is exercising 0 minutes 0 times per week.  Today's visit was #: 15 Starting weight: 270 lbs Starting date: 02/15/2021 Today's weight: 240 lbs Today's date: 10/11/2021 Total lbs lost to date: 30 lbs Total lbs lost since last in-office visit: 1  Interim History: [name] is still recovering from surgery on her lt arm. She is unable to exercise due to pain. Has overall done well weight loss. Struggles with some stress at home. Hoping to start back to work at [employer] soon. Seeing Dr. Mallie Mussel on a regular basis and has a follow up appointment scheduled.  Subjective:   1. Essential hypertension [name] is taking Metoprolol 100 mg. Denies any side effects.Denies chest pain, chest pressure and headache.  Assessment/Plan:   1. Essential hypertension Continue to follow up with PCP and continue medications as directed. [name] is working on healthy weight loss and exercise to improve blood pressure control. We will watch for signs of hypotension as she continues her lifestyle modifications.   2. Obesity, Current BMI 35.6 [name] is currently in the action stage of change. As such, her goal is to continue with weight loss efforts. She has agreed to keeping a food journal and adhering to recommended goals of 1100-1200 calories and 90+ grams of protein.   Exercise goals: No exercise has been prescribed at this time. Per Ortho  Behavioral modification strategies: increasing lean protein intake, increasing vegetables, and increasing water intake.  [name] has agreed to follow-up with our clinic in 2 weeks. She was informed of the importance of  frequent follow-up visits to maximize her success with intensive lifestyle modifications for her multiple health conditions.    Objective:   Blood pressure 105/74, pulse 66, temperature 98.2 F (36.8 C), height '5\' 9"'$  (1.753 m), weight 240 lb (108.9 kg), SpO2 98 %. Body mass index is 35.44 kg/m.  General: Cooperative, alert, well developed, in no acute distress. HEENT: Conjunctivae and lids unremarkable. Cardiovascular: Regular rhythm.  Lungs: Normal work of breathing. Neurologic: No focal deficits.   Lab Results  Component Value Date   CREATININE 0.85 08/18/2021   BUN 13 08/17/2021   NA 137 08/17/2021   K 4.3 08/17/2021   CL 101 08/17/2021   CO2 27 08/17/2021   Lab Results  Component Value Date   ALT 18 08/17/2021   AST 18 08/17/2021   ALKPHOS 64 08/17/2021   BILITOT 0.5 08/17/2021   Lab Results  Component Value Date   HGBA1C 5.2 07/06/2021   HGBA1C 5.1 02/15/2021   HGBA1C 5.1 12/29/2016   HGBA1C 5.1 07/19/2016   HGBA1C 5.4 09/25/2014   Lab Results  Component Value Date   INSULIN 6.0 07/06/2021   INSULIN 9.3 02/15/2021   Lab Results  Component Value Date   TSH 2.660 02/15/2021   Lab Results  Component Value Date   CHOL 190 07/06/2021   HDL 56 07/06/2021   LDLCALC 123 (H) 07/06/2021   TRIG 56 07/06/2021   CHOLHDL 3.4 07/06/2021   Lab Results  Component Value Date   VD25OH 74.2 07/06/2021   VD25OH 49.4 02/15/2021   Lab Results  Component Value Date   WBC 7.3  08/18/2021   HGB 13.5 08/18/2021   HCT 41.2 08/18/2021   MCV 93.6 08/18/2021   PLT 201 08/18/2021   Lab Results  Component Value Date   IRON 111 07/19/2020   TIBC 286 07/19/2020   FERRITIN 63 07/19/2020   Attestation Statements:   Reviewed by clinician on day of visit: allergies, medications, problem list, medical history, surgical history, family history, social history, and previous encounter notes.  Time spent on visit including pre-visit chart review and post-visit care and  charting was 30 minutes.   I, Brendell Tyus, RMA, am acting as transcriptionist for Everardo Pacific, FNP.  I have reviewed the above documentation for accuracy and completeness, and I agree with the above. Everardo Pacific, FNP

## 2021-10-17 ENCOUNTER — Telehealth (INDEPENDENT_AMBULATORY_CARE_PROVIDER_SITE_OTHER): Payer: Self-pay | Admitting: Psychology

## 2021-10-17 ENCOUNTER — Telehealth (INDEPENDENT_AMBULATORY_CARE_PROVIDER_SITE_OTHER): Payer: PPO | Admitting: Psychology

## 2021-10-17 DIAGNOSIS — F5089 Other specified eating disorder: Secondary | ICD-10-CM | POA: Diagnosis not present

## 2021-10-17 DIAGNOSIS — F319 Bipolar disorder, unspecified: Secondary | ICD-10-CM

## 2021-10-17 DIAGNOSIS — F909 Attention-deficit hyperactivity disorder, unspecified type: Secondary | ICD-10-CM

## 2021-10-17 NOTE — Progress Notes (Signed)
Office: [phone]  /  Fax: [phone]    Date: October 17, 2021    Appointment Start Time: 12:07pm Duration: 28 minutes Provider: Glennie Isle, Psy.D. Type of Session: Individual Therapy  Location of Patient: Home (private location) Location of Provider: Provider's Home (private office) Type of Contact: Telepsychological Visit via MyChart Video Visit  Session Content: This provider called [name] at 12:05pm as she did not present for today's appointment. She indicated she was in the process of getting her computer to work. As such, today's appointment was initiated 7 minutes late. [name] is a 51 y.o. female presenting for a follow-up appointment to address the previously established treatment goal of increasing coping skills.Today's appointment was a telepsychological visit. [name] provided verbal consent for today's telepsychological appointment and she is aware she is responsible for securing confidentiality on her end of the session. Prior to proceeding with today's appointment, [name]'s physical location at the time of this appointment was obtained as well a phone number she could be reached at in the event of technical difficulties. [name] and this provider participated in today's telepsychological service.   This provider conducted a brief check-in. [name] shared she lost 30 pounds and described feeling proud. She shared about other recent events, noting work scheduling issues. A risk assessment was completed. [name] denied experiencing suicidal and homicidal ideation, plan, and intent since the last appointment with this provider. She continues to acknowledge understanding regarding the importance of reaching out to trusted individuals and/or emergency resources if she is unable to ensure safety. Moreover, [name] discussed challenges with remembering to drink water. She feels she is experiencing "brain fog" since her concussions. It was recommended she follow-up with her neurologist.  She agreed. She was engaged in problem solving to help increase water intake. She was receptive to setting reminders on her phone and keeping a water bottle at her side as a visual reminder. [name] acknowledged some recent instances of emotional eating behaviors. Reviewed learned skills to help with emotional eating behaviors. Overall, [name] was receptive to today's appointment as evidenced by openness to sharing, responsiveness to feedback, and willingness to implement discussed strategies .  Mental Status Examination:  Appearance: neat Behavior: appropriate to circumstances Mood: neutral Affect: mood congruent Speech: WNL Eye Contact: appropriate Psychomotor Activity: WNL Gait: unable to assess Thought Process: linear, logical, and goal directed and denies suicidal, homicidal, and self-harm ideation, plan and intent  Thought Content/Perception: no hallucinations, delusions, bizarre thinking or behavior endorsed or observed Orientation: AAOx4 Memory/Concentration: memory, attention, language, and fund of knowledge intact  Insight: good Judgment: fair  Interventions:  Conducted a brief chart review Conducted a risk assessment Provided empathic reflections and validation Employed supportive psychotherapy interventions to facilitate reduced distress and to improve coping skills with identified stressors Engaged patient in problem solving Reviewed learned skills  DSM-5 Diagnosis(es):  F50.89 Other Specified Feeding or Eating Disorder, Emotional and Binge Eating Behaviors, F90.9 Unspecified Attention-Deficit/Hyperactivity Disorder , and F31.9 Unspecified Bipolar and Related Disorder  Treatment Goal & Progress: During the initial appointment with this provider, the following treatment goal was established: increase coping skills. [name] has demonstrated progress in her goal as evidenced by increased awareness of hunger patterns, increased awareness of triggers for emotional eating behaviors,  and reduction in emotional and binge eating behaviors. [name] also continues to demonstrate willingness to engage in learned skill(s).  Plan: The next appointment is scheduled for 10/31/2021 at 2pm, which will be via [facility] Visit. The next session will focus on working towards the established  treatment goal. [name] will continue with her primary therapist.

## 2021-10-17 NOTE — Telephone Encounter (Signed)
  Office: [phone]  /  Fax: [phone]  Date of Call: October 17, 2021  Time of Call: 11:04am Provider: Glennie Isle, PsyD  CONTENT: This provider called Kindall to check-in as she did not present for today's MyChart Video Visit appointment at 11:00am. A HIPAA compliant voicemail was left requesting a call back.   PLAN: This provider will wait for Shima to call back. No further follow-up planned by this provider.

## 2021-10-24 ENCOUNTER — Encounter: Payer: Self-pay | Admitting: Family Medicine

## 2021-10-24 ENCOUNTER — Ambulatory Visit (INDEPENDENT_AMBULATORY_CARE_PROVIDER_SITE_OTHER): Payer: PPO | Admitting: Family Medicine

## 2021-10-24 ENCOUNTER — Ambulatory Visit: Payer: Self-pay

## 2021-10-24 VITALS — BP 118/78 | Ht 69.0 in | Wt 240.0 lb

## 2021-10-24 DIAGNOSIS — M19071 Primary osteoarthritis, right ankle and foot: Secondary | ICD-10-CM

## 2021-10-24 DIAGNOSIS — M79674 Pain in right toe(s): Secondary | ICD-10-CM

## 2021-10-24 NOTE — Progress Notes (Signed)
[name] - 51 y.o. female MRN [id number]  Date of birth: [birth year]  SUBJECTIVE:  Including CC & ROS.  No chief complaint on file.   [name] is a 51 y.o. female that is presenting with acute right great toe pain.  She has a history of surgery in the area.  Received an injection recently.  Having pain around the first MTP joint.  Independent review of the right foot x-ray from 9/13 shows degenerative changes of the first MTP joint with previous evidence of surgery.   Review of Systems See HPI   HISTORY: Past Medical, Surgical, Social, and Family History Reviewed & Updated per EMR.   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   rt chest wall nodule removal   GANGLION CYST EXCISION  2011   lipoma removal     right bunioectomy     SHOULDER SURGERY  01/13/2011   right, partial tear   tumor resection left thigh       PHYSICAL EXAM:  VS: BP 118/78 (BP Location: Right Arm, Patient Position: Sitting)   Ht _0  (1.753 m)   Wt 240 lb (108.9 kg)   BMI 35.44 kg/m  Physical Exam Gen: NAD, alert, cooperative with exam, well-appearing MSK: Neurovascularly intact    Limited ultrasound: Right great toe:  Degenerative changes of the first MTP joint with effusion. Previous surgical hardware was appreciated with no associated effusion or hyperemia   Summary: Degenerative changes appreciated of the first MTP joint  Ultrasound and interpretation by Clearance Coots, MD    ASSESSMENT & PLAN:   Arthritis of first metatarsophalangeal (MTP) joint of right foot Acute occurring. Symptoms appear more consistent with the joint as opposed to the surgical  hardware.  -Counseled on home exercise therapy and supportive care. -could consider orthotics or injection.

## 2021-10-24 NOTE — Assessment & Plan Note (Signed)
Acute occurring. Symptoms appear more consistent with the joint as opposed to the surgical hardware.  -Counseled on home exercise therapy and supportive care. -could consider orthotics or injection.

## 2021-10-25 DIAGNOSIS — M79642 Pain in left hand: Secondary | ICD-10-CM | POA: Diagnosis not present

## 2021-10-31 ENCOUNTER — Telehealth (INDEPENDENT_AMBULATORY_CARE_PROVIDER_SITE_OTHER): Payer: PPO | Admitting: Psychology

## 2021-10-31 ENCOUNTER — Other Ambulatory Visit: Payer: Self-pay | Admitting: Family Medicine

## 2021-10-31 DIAGNOSIS — F319 Bipolar disorder, unspecified: Secondary | ICD-10-CM | POA: Diagnosis not present

## 2021-10-31 DIAGNOSIS — F5089 Other specified eating disorder: Secondary | ICD-10-CM | POA: Diagnosis not present

## 2021-10-31 DIAGNOSIS — Z1231 Encounter for screening mammogram for malignant neoplasm of breast: Secondary | ICD-10-CM

## 2021-10-31 DIAGNOSIS — F909 Attention-deficit hyperactivity disorder, unspecified type: Secondary | ICD-10-CM | POA: Diagnosis not present

## 2021-10-31 NOTE — Progress Notes (Signed)
Office: [phone]  /  Fax: [phone]    Date: October 31, 2021    Appointment Start Time: 11:01am Duration: 31 minutes Provider: Glennie Isle, Psy.D. Type of Session: Individual Therapy  Location of Patient: Home (private location) Location of Provider: Provider's Home (private office) Type of Contact: Telepsychological Visit via MyChart Video Visit  Session Content: [name] is a 51 y.o. female presenting for a follow-up appointment to address the previously established treatment goal of increasing coping skills.Today's appointment was a telepsychological visit. [name] provided verbal consent for today's telepsychological appointment and she is aware she is responsible for securing confidentiality on her end of the session. Prior to proceeding with today's appointment, [name]'s physical location at the time of this appointment was obtained as well a phone number she could be reached at in the event of technical difficulties. [name] and this provider participated in today's telepsychological service. Of note, today's appointment was switched to a regular telephone call at 11:28am with [name]'s verbal consent due to her losing power.  This provider conducted a brief check-in. [name] stated, "I've had a hard time sticking to my proposed meal plan." Further explored and processed. A risk assessment was completed. [name] discussed experiencing "passive" suicidal ideation due to recent weight gain and described the thoughts as fleeting. She denied experiencing suicidal plan and intent. [name] indicated the last time she experienced passive suicidal ideation was a "couple days ago" and she denied experiencing current suicidal ideation. She continues to acknowledge understanding regarding the importance of reaching out to trusted individuals and/or emergency resources if she is unable to ensure safety. She described currently focusing on getting back to a regular work schedule and focusing on her  weight loss journey. Due to recent challenges with eating habits, this provider and [name] discussed the dieting mentality vs. lifestyle change. Moreover, [name] also discussed continued sleep-related challenges. Briefly discussed sleep hygiene, specifically as it relates to routine. She was encouraged to discuss further with her primary therapist; she agreed. Overall, [name] was receptive to today's appointment as evidenced by openness to sharing, responsiveness to feedback, and willingness to implement discussed strategies.   Mental Status Examination:  Appearance: neat Behavior: appropriate to circumstances Mood: neutral Affect: mood congruent Speech: WNL Eye Contact: appropriate Psychomotor Activity: WNL Gait: unable to assess Thought Process: linear, logical, and goal directed and denies suicidal, homicidal, and self-harm ideation, plan and intent  Thought Content/Perception: no hallucinations, delusions, bizarre thinking or behavior endorsed or observed Orientation: AAOx4 Memory/Concentration: memory, attention, language, and fund of knowledge intact  Insight: good Judgment: fair  Interventions:  Conducted a brief chart review Conducted a risk assessment Provided empathic reflections and validation Employed supportive psychotherapy interventions to facilitate reduced distress and to improve coping skills with identified stressors Brief psychoeducation provided regarding sleep hygiene Psychoeducation provided regarding diet mentality vs. lifestyle change  DSM-5 Diagnosis(es):  F50.89 Other Specified Feeding or Eating Disorder, Emotional and Binge Eating Behaviors, F90.9 Unspecified Attention-Deficit/Hyperactivity Disorder , and F31.9 Unspecified Bipolar and Related Disorder  Treatment Goal & Progress: During the initial appointment with this provider, the following treatment goal was established: increase coping skills. [name] has demonstrated progress in her goal as evidenced by  increased awareness of hunger patterns, increased awareness of triggers for emotional eating behaviors, reduction in emotional eating behaviors , and reduction in binge eating behaviors. [name] also continues to demonstrate willingness to engage in learned skill(s).  Plan: The next appointment is scheduled for 11/14/2021 at 11:30am, which will be via MyChart Video Visit. The next  session will focus on working towards the established treatment goal. [name] will continue with her primary therapist. Their next appointment is scheduled for 11/08/2021.

## 2021-11-01 ENCOUNTER — Inpatient Hospital Stay: Admission: RE | Admit: 2021-11-01 | Payer: PPO | Source: Ambulatory Visit

## 2021-11-01 DIAGNOSIS — M79604 Pain in right leg: Secondary | ICD-10-CM | POA: Diagnosis not present

## 2021-11-01 DIAGNOSIS — Z6835 Body mass index (BMI) 35.0-35.9, adult: Secondary | ICD-10-CM | POA: Diagnosis not present

## 2021-11-01 DIAGNOSIS — M7989 Other specified soft tissue disorders: Secondary | ICD-10-CM | POA: Diagnosis not present

## 2021-11-02 ENCOUNTER — Other Ambulatory Visit (HOSPITAL_COMMUNITY): Payer: Self-pay | Admitting: Family Medicine

## 2021-11-02 ENCOUNTER — Ambulatory Visit: Payer: PPO | Admitting: Podiatry

## 2021-11-02 ENCOUNTER — Ambulatory Visit (INDEPENDENT_AMBULATORY_CARE_PROVIDER_SITE_OTHER): Payer: PPO | Admitting: Family Medicine

## 2021-11-02 ENCOUNTER — Ambulatory Visit (HOSPITAL_COMMUNITY)
Admission: RE | Admit: 2021-11-02 | Discharge: 2021-11-02 | Disposition: A | Discharge status: Home / Self Care | Payer: PPO | Source: Ambulatory Visit | Attending: Family Medicine | Admitting: Family Medicine

## 2021-11-02 DIAGNOSIS — M79604 Pain in right leg: Secondary | ICD-10-CM | POA: Insufficient documentation

## 2021-11-02 DIAGNOSIS — M7989 Other specified soft tissue disorders: Secondary | ICD-10-CM | POA: Diagnosis not present

## 2021-11-04 ENCOUNTER — Ambulatory Visit: Payer: PPO | Admitting: Podiatry

## 2021-11-07 ENCOUNTER — Encounter (INDEPENDENT_AMBULATORY_CARE_PROVIDER_SITE_OTHER): Payer: Self-pay | Admitting: Family Medicine

## 2021-11-07 ENCOUNTER — Ambulatory Visit (INDEPENDENT_AMBULATORY_CARE_PROVIDER_SITE_OTHER): Payer: PPO | Admitting: Family Medicine

## 2021-11-07 VITALS — BP 127/77 | HR 77 | Temp 97.6°F | Ht 69.0 in | Wt 242.0 lb

## 2021-11-07 DIAGNOSIS — Z6835 Body mass index (BMI) 35.0-35.9, adult: Secondary | ICD-10-CM | POA: Diagnosis not present

## 2021-11-07 DIAGNOSIS — E559 Vitamin D deficiency, unspecified: Secondary | ICD-10-CM

## 2021-11-07 DIAGNOSIS — E669 Obesity, unspecified: Secondary | ICD-10-CM

## 2021-11-07 DIAGNOSIS — E88819 Insulin resistance, unspecified: Secondary | ICD-10-CM | POA: Diagnosis not present

## 2021-11-07 DIAGNOSIS — E538 Deficiency of other specified B group vitamins: Secondary | ICD-10-CM

## 2021-11-14 ENCOUNTER — Telehealth (INDEPENDENT_AMBULATORY_CARE_PROVIDER_SITE_OTHER): Payer: PPO | Admitting: Psychology

## 2021-11-14 DIAGNOSIS — F5089 Other specified eating disorder: Secondary | ICD-10-CM

## 2021-11-14 DIAGNOSIS — F319 Bipolar disorder, unspecified: Secondary | ICD-10-CM

## 2021-11-14 DIAGNOSIS — F909 Attention-deficit hyperactivity disorder, unspecified type: Secondary | ICD-10-CM | POA: Diagnosis not present

## 2021-11-14 NOTE — Progress Notes (Signed)
  Office: [phone]  /  Fax: [phone]    Date: November 14, 2021    Appointment Start Time: 11:33am Duration: 31 minutes Provider: Glennie Isle, Psy.D. Type of Session: Individual Therapy  Location of Patient: Home (private location) Location of Provider: Provider's Home (private office) Type of Contact: Telepsychological Visit via MyChart Video Visit  Session Content: [name] is a 51 y.o. female presenting for a follow-up appointment to address the previously established treatment goal of increasing coping skills.Today's appointment was a telepsychological visit. [name] provided verbal consent for today's telepsychological appointment and she is aware she is responsible for securing confidentiality on her end of the session. Prior to proceeding with today's appointment, [name]'s physical location at the time of this appointment was obtained as well a phone number she could be reached at in the event of technical difficulties. [name] and this provider participated in today's telepsychological service.   This provider conducted a brief check-in. [name] shared about her birthday. Regarding eating habits, she noted, "I haven't been doing great." Further explored and processed. She reflected on the importance of routine for her and noted ongoing sleep challenges. Due to ongoing sleeping difficulties, psychoeducation regarding sleep hygiene was provided, including the impact of poor sleep on eating habits/weight loss. [name] provided verbal consent during today's appointment for this provider to send a handout with strategies for sleep hygiene via e-mail. A risk assessment was completed. [name] denied experiencing suicidal, self-harm, and homicidal ideation, plan, and intent since the last appointment with this provider. She continues to acknowledge understanding regarding the importance of reaching out to trusted individuals and/or emergency resources if she is unable to ensure safety. She described  future plans that include ongoing efforts to improve her well-being and eating habits. Overall, [name] was receptive to today's appointment as evidenced by openness to sharing, responsiveness to feedback, and willingness to implement discussed strategies .  Mental Status Examination:  Appearance: neat Behavior: appropriate to circumstances Mood: neutral Affect: mood congruent Speech: WNL Eye Contact: appropriate Psychomotor Activity: WNL Gait: unable to assess Thought Process: linear, logical, and goal directed and denies suicidal, homicidal, and self-harm ideation, plan and intent  Thought Content/Perception: no hallucinations, delusions, bizarre thinking or behavior endorsed or observed Orientation: AAOx4 Memory/Concentration: memory, attention, language, and fund of knowledge intact  Insight: good Judgment: fair  Interventions:  Conducted a brief chart review Conducted a risk assessment Provided empathic reflections and validation Employed supportive psychotherapy interventions to facilitate reduced distress and to improve coping skills with identified stressors Psychoeducation provided regarding sleep hygiene  DSM-5 Diagnosis(es):  F50.89 Other Specified Feeding or Eating Disorder, Emotional and Binge Eating Behaviors, F90.9 Unspecified Attention-Deficit/Hyperactivity Disorder , and F31.9 Unspecified Bipolar and Related Disorder  Treatment Goal & Progress: During the initial appointment with this provider, the following treatment goal was established: increase coping skills. [name] has demonstrated progress in her goal as evidenced by increased awareness of hunger patterns, increased awareness of triggers for emotional eating behaviors, and reduction in emotional and binge eating behaviors. [name] also continues to demonstrate willingness to engage in learned skill(s).  Plan: The next appointment is scheduled for 11/28/2021 at 11:30am, which will be via MyChart Video Visit. The next  session will focus on working towards the established treatment goal. [name] will continue with her primary therapist.

## 2021-11-14 NOTE — Progress Notes (Unsigned)
Chief Complaint:   OBESITY [name] is here to discuss her progress with her obesity treatment plan along with follow-up of her obesity related diagnoses. [name] is on keeping a food journal and adhering to recommended goals of 1100-1200 calories and 90+ grams protein and states she is following her eating plan approximately 50% of the time. [name] states she is walking more.  Today's visit was #: 30 Starting weight: 270 lbs Starting date: 02/15/2021 Today's weight: 242 lbs Today's date: 11/07/2021 Total lbs lost to date: 28 Total lbs lost since last in-office visit: +2  Interim History: I last saw [name] on 08/24/2021. [name] has been following up wit Dr. Mallie Mussel for emotional eating counseling and NP Colletta Maryland in the office. Jesyca has been doing more quick meals and eating out lately. She gained 3 lbs in fat mass.  Subjective:   1. Insulin resistance [name]'s last fasting insulin was 6.0.  2. B12 deficiency [place] stopped B12 supplement 07/21/2021. She is not taking any OTC meds currently and is just on prudent nutritional plan.  3. Vitamin D deficiency [name] did not lower intake after our appointment on 07/21/2021. She is taking OTC Vit D 5,000 IU daily.  Assessment/Plan:   Orders Placed This Encounter  Procedures   Vitamin B12   VITAMIN D 25 Hydroxy (Vit-D Deficiency, Fractures)   Insulin, random   Basic Metabolic Panel (BMET)    There are no discontinued medications.   No orders of the defined types were placed in this encounter.    1. Insulin resistance [name] will continue to work on weight loss, exercise, and decreasing simple carbohydrates to help decrease the risk of diabetes. [name] agreed to follow-up with [name] as directed to closely monitor her progress.  Lab/Orders today or future: - Insulin, random - Basic Metabolic Panel (BMET)  2. B12 deficiency The diagnosis was reviewed with the patient. Counseling provided today, see below. We will continue to monitor. Orders and  follow up as documented in patient record.  Counseling The body needs vitamin B12: to make red blood cells; to make DNA; and to help the nerves work properly so they can carry messages from the brain to the body.  The main causes of vitamin B12 deficiency include dietary deficiency, digestive diseases, pernicious anemia, and having a surgery in which part of the stomach or small intestine is removed.  Certain medicines can make it harder for the body to absorb vitamin B12. These medicines include: heartburn medications; some antibiotics; some medications used to treat diabetes, gout, and high cholesterol.  In some cases, there are no symptoms of this condition. If the condition leads to anemia or nerve damage, various symptoms can occur, such as weakness or fatigue, shortness of breath, and numbness or tingling in your hands and feet.   Treatment:  May include taking vitamin B12 supplements.  Avoid alcohol.  Eat lots of healthy foods that contain vitamin B12: Beef, pork, chicken, [place], and organ meats, such as liver.  Seafood: This includes clams, rainbow trout, salmon, tuna, and haddock. Eggs.  Cereal and dairy products that are fortified: This means that vitamin B12 has been added to the food.  Lab/Orders today or future: - Vitamin B12  3. Vitamin D deficiency Low Vitamin D level contributes to fatigue and are associated with obesity, breast, and colon cancer. She agrees to continue to take OTC Vitamin D 5,000 IU daily and will follow-up for routine testing of Vitamin D, at least 2-3 times per year to avoid  over-replacement.  Lab/Orders today or future: - VITAMIN D 25 Hydroxy (Vit-D Deficiency, Fractures)  4. Obesity, Current BMI 35.8 Heike is currently in the action stage of change. As such, her goal is to continue with weight loss efforts. She has agreed to keeping a food journal and adhering to recommended goals of 1100-1200 calories and 90+ grams protein.   Exercise goals:  As  is  Behavioral modification strategies: decreasing eating out, no skipping meals, and avoiding temptations.  [name] has agreed to follow-up with our clinic in 3 weeks. She was informed of the importance of frequent follow-up visits to maximize her success with intensive lifestyle modifications for her multiple health conditions.   [name] was informed we would discuss her lab results at her next visit unless there is a critical issue that needs to be addressed sooner. [name] agreed to keep her next visit at the agreed upon time to discuss these results.  Objective:   Blood pressure 127/77, pulse 77, temperature 97.6 F (36.4 C), height '5\' 9"'$  (1.753 m), weight 242 lb (109.8 kg), SpO2 98 %. Body mass index is 35.74 kg/m.  General: Cooperative, alert, well developed, in no acute distress. HEENT: Conjunctivae and lids unremarkable. Cardiovascular: Regular rhythm.  Lungs: Normal work of breathing. Neurologic: No focal deficits.   Lab Results  Component Value Date   CREATININE 0.85 08/18/2021   BUN 13 08/17/2021   NA 137 08/17/2021   K 4.3 08/17/2021   CL 101 08/17/2021   CO2 27 08/17/2021   Lab Results  Component Value Date   ALT 18 08/17/2021   AST 18 08/17/2021   ALKPHOS 64 08/17/2021   BILITOT 0.5 08/17/2021   Lab Results  Component Value Date   HGBA1C 5.2 07/06/2021   HGBA1C 5.1 02/15/2021   HGBA1C 5.1 12/29/2016   HGBA1C 5.1 07/19/2016   HGBA1C 5.4 09/25/2014   Lab Results  Component Value Date   INSULIN 6.0 07/06/2021   INSULIN 9.3 02/15/2021   Lab Results  Component Value Date   TSH 2.660 02/15/2021   Lab Results  Component Value Date   CHOL 190 07/06/2021   HDL 56 07/06/2021   LDLCALC 123 (H) 07/06/2021   TRIG 56 07/06/2021   CHOLHDL 3.4 07/06/2021   Lab Results  Component Value Date   VD25OH 74.2 07/06/2021   VD25OH 49.4 02/15/2021   Lab Results  Component Value Date   WBC 7.3 08/18/2021   HGB 13.5 08/18/2021   HCT 41.2 08/18/2021   MCV 93.6  08/18/2021   PLT 201 08/18/2021   Lab Results  Component Value Date   IRON 111 07/19/2020   TIBC 286 07/19/2020   FERRITIN 63 07/19/2020   Attestation Statements:   Reviewed by clinician on day of visit: allergies, medications, problem list, medical history, surgical history, family history, social history, and previous encounter notes.  I, Kathlene November, BS, CMA, am acting as transcriptionist for [employer], DO.   I have reviewed the above documentation for accuracy and completeness, and I agree with the above. Marjory Sneddon, D.O.  The [place] was signed into law in 2016 which includes the topic of electronic health records.  This provides immediate access to information in MyChart.  This includes consultation notes, operative notes, office notes, lab results and pathology reports.  If you have any questions about what you read please let [name] know at your next visit so we can discuss your concerns and take corrective action if need be.  We  are right here with you.

## 2021-11-16 DIAGNOSIS — F603 Borderline personality disorder: Secondary | ICD-10-CM | POA: Diagnosis not present

## 2021-11-16 DIAGNOSIS — F900 Attention-deficit hyperactivity disorder, predominantly inattentive type: Secondary | ICD-10-CM | POA: Diagnosis not present

## 2021-11-16 DIAGNOSIS — F3181 Bipolar II disorder: Secondary | ICD-10-CM | POA: Diagnosis not present

## 2021-11-22 ENCOUNTER — Encounter (INDEPENDENT_AMBULATORY_CARE_PROVIDER_SITE_OTHER): Payer: Self-pay | Admitting: Family Medicine

## 2021-11-22 ENCOUNTER — Ambulatory Visit (INDEPENDENT_AMBULATORY_CARE_PROVIDER_SITE_OTHER): Payer: PPO | Admitting: Family Medicine

## 2021-11-22 VITALS — BP 115/72 | HR 60 | Temp 97.9°F | Ht 69.0 in | Wt 243.0 lb

## 2021-11-22 DIAGNOSIS — E7849 Other hyperlipidemia: Secondary | ICD-10-CM

## 2021-11-22 DIAGNOSIS — E88819 Insulin resistance, unspecified: Secondary | ICD-10-CM

## 2021-11-22 DIAGNOSIS — Z6835 Body mass index (BMI) 35.0-35.9, adult: Secondary | ICD-10-CM

## 2021-11-22 DIAGNOSIS — F319 Bipolar disorder, unspecified: Secondary | ICD-10-CM | POA: Diagnosis not present

## 2021-11-22 DIAGNOSIS — E669 Obesity, unspecified: Secondary | ICD-10-CM | POA: Diagnosis not present

## 2021-11-27 NOTE — Patient Instructions (Signed)
The 10-year ASCVD risk score (Arnett DK, et al., 2019) is: 1.5%   Values used to calculate the score:     Age: 51 years     Sex: Female     Is Non-Hispanic African American: No     Diabetic: No     Tobacco smoker: No     Systolic Blood Pressure: 480 mmHg     Is BP treated: Yes     HDL Cholesterol: 56 mg/dL     Total Cholesterol: 190 mg/dL

## 2021-11-28 ENCOUNTER — Telehealth (INDEPENDENT_AMBULATORY_CARE_PROVIDER_SITE_OTHER): Payer: PPO | Admitting: Psychology

## 2021-11-28 ENCOUNTER — Ambulatory Visit
Admission: RE | Admit: 2021-11-28 | Discharge: 2021-11-28 | Disposition: A | Discharge status: Home / Self Care | Payer: PPO | Source: Ambulatory Visit | Attending: Family Medicine | Admitting: Family Medicine

## 2021-11-28 DIAGNOSIS — F5089 Other specified eating disorder: Secondary | ICD-10-CM | POA: Diagnosis not present

## 2021-11-28 DIAGNOSIS — F319 Bipolar disorder, unspecified: Secondary | ICD-10-CM | POA: Diagnosis not present

## 2021-11-28 DIAGNOSIS — Z1231 Encounter for screening mammogram for malignant neoplasm of breast: Secondary | ICD-10-CM | POA: Diagnosis not present

## 2021-11-28 DIAGNOSIS — F909 Attention-deficit hyperactivity disorder, unspecified type: Secondary | ICD-10-CM

## 2021-11-28 NOTE — Progress Notes (Signed)
Office: [phone]  /  Fax: [phone]    Date: November 28, 2021    Appointment Start Time: 11:33am Duration: 30 minutes Provider: Glennie Isle, Psy.D. Type of Session: Individual Therapy  Location of Patient: Home (private location) Location of Provider: Provider's Home (private office) Type of Contact: Telepsychological Visit via MyChart Video Visit  Session Content: [name] is a 51 y.o. female presenting for a follow-up appointment to address the previously established treatment goal of increasing coping skills.Today's appointment was a telepsychological visit. [name] provided verbal consent for today's telepsychological appointment and she is aware she is responsible for securing confidentiality on her end of the session. Prior to proceeding with today's appointment, [name]'s physical location at the time of this appointment was obtained as well a phone number she could be reached at in the event of technical difficulties. [name] and this provider participated in today's telepsychological service.   This provider conducted a brief check-in. [name] stated, "I kind of fell off the wagon a little bit and gained a pound." Further explored and processed. She stated she is back to work and has picked up extra hours, which has impacted her eating habits and overall routine. She was engaged in problem solving. [name] was receptive to trying the following: doubling recipes on days off; switch lunch and dinner portions of her structured meal plan on days she works; and use handout for eating out. Despite the recent challenges, [name] noted an overall reduction in emotional and binge eating behaviors. Notably, she reported an improvement in sleeping habits and discussed implementation of shared strategies. Positive reinforcement was provided. Furthermore, termination planning was discussed. [name] was receptive to a follow-up appointment in 3-4 weeks and an additional follow-up/termination appointment  in 3-4 weeks after that. A risk assessment was completed. [name] denied experiencing suicidal, self-harm, and homicidal ideation, plan, and intent since the last appointment with this provider. She continues to acknowledge understanding regarding the importance of reaching out to trusted individuals and/or emergency resources if she is unable to ensure safety. She described future plans that include ongoing efforts to improve her well-being and eating habits. Overall, [name] was receptive to today's appointment as evidenced by openness to sharing, responsiveness to feedback, and willingness to implement discussed strategies .  Mental Status Examination:  Appearance: neat Behavior: appropriate to circumstances Mood: neutral Affect: mood congruent Speech: WNL Eye Contact: appropriate Psychomotor Activity: WNL Gait: unable to assess Thought Process: linear, logical, and goal directed and denies suicidal, homicidal, and self-harm ideation, plan and intent  Thought Content/Perception: no hallucinations, delusions, bizarre thinking or behavior endorsed or observed Orientation: AAOx4 Memory/Concentration: memory, attention, language, and fund of knowledge intact  Insight: good Judgment: fair  Interventions:  Conducted a brief chart review Conducted a risk assessment Provided empathic reflections and validation Employed supportive psychotherapy interventions to facilitate reduced distress and to improve coping skills with identified stressors Engaged patient in problem solving Discussed termination planning   DSM-5 Diagnosis(es):  F50.89 Other Specified Feeding or Eating Disorder, Emotional and Binge Eating Behaviors, F90.9 Unspecified Attention-Deficit/Hyperactivity Disorder , and F31.9 Unspecified Bipolar and Related Disorder  Treatment Goal & Progress: During the initial appointment with this provider, the following treatment goal was established: increase coping skills. [name] has  demonstrated progress in her goal as evidenced by increased awareness of hunger patterns, increased awareness of triggers for emotional eating behaviors, reduction in emotional eating behaviors , and reduction in binge eating behaviors. [name] also continues to demonstrate willingness to engage in learned skill(s).  Plan: The next  appointment is scheduled for 12/20/2021 at 11am, which will be via MyChart Video Visit. The next session will focus on working towards the established treatment goal. [name] reported she will continue with her primary therapist.

## 2021-12-05 ENCOUNTER — Ambulatory Visit (INDEPENDENT_AMBULATORY_CARE_PROVIDER_SITE_OTHER): Payer: PPO | Admitting: Family Medicine

## 2021-12-06 ENCOUNTER — Encounter (INDEPENDENT_AMBULATORY_CARE_PROVIDER_SITE_OTHER): Payer: Self-pay | Admitting: Family Medicine

## 2021-12-06 ENCOUNTER — Ambulatory Visit (INDEPENDENT_AMBULATORY_CARE_PROVIDER_SITE_OTHER): Payer: PPO | Admitting: Family Medicine

## 2021-12-06 VITALS — BP 105/69 | HR 60 | Temp 98.1°F | Ht 69.0 in | Wt 241.0 lb

## 2021-12-06 DIAGNOSIS — E669 Obesity, unspecified: Secondary | ICD-10-CM | POA: Diagnosis not present

## 2021-12-06 DIAGNOSIS — Z6835 Body mass index (BMI) 35.0-35.9, adult: Secondary | ICD-10-CM

## 2021-12-06 DIAGNOSIS — E7849 Other hyperlipidemia: Secondary | ICD-10-CM

## 2021-12-07 DIAGNOSIS — J301 Allergic rhinitis due to pollen: Secondary | ICD-10-CM | POA: Diagnosis not present

## 2021-12-07 DIAGNOSIS — R052 Subacute cough: Secondary | ICD-10-CM | POA: Diagnosis not present

## 2021-12-07 DIAGNOSIS — J3089 Other allergic rhinitis: Secondary | ICD-10-CM | POA: Diagnosis not present

## 2021-12-07 DIAGNOSIS — R21 Rash and other nonspecific skin eruption: Secondary | ICD-10-CM | POA: Diagnosis not present

## 2021-12-07 DIAGNOSIS — J709 Respiratory conditions due to unspecified external agent: Secondary | ICD-10-CM | POA: Diagnosis not present

## 2021-12-07 DIAGNOSIS — L237 Allergic contact dermatitis due to plants, except food: Secondary | ICD-10-CM | POA: Diagnosis not present

## 2021-12-07 NOTE — Progress Notes (Signed)
Chief Complaint:   OBESITY [name] is here to discuss her progress with her obesity treatment plan along with follow-up of her obesity related diagnoses. [name] is on the Category 1 Plan and keeping a food journal and adhering to recommended goals of 1100-1200 calories and 90+ protein and states she is following her eating plan approximately 65% of the time. [name] states she is not exercising.   Today's visit was #: 69 Starting weight: 270 lbs Starting date: 001/17/2023 Today's weight: 243 lbs Today's date: 11/22/2021 Total lbs lost to date: 27 lbs Total lbs lost since last in-office visit: +1 lb  Interim History: Her work is going much better, she has a new boss.  Patient in much better spirits today.  Patient has focused on protein more, but she ate out more often than she should have. Six days this past week, she ate out for dinner.  She occasionally  skips lunch due to work.    Subjective:   1. Insulin resistance Fasting insulin initially 9.3.  4 months ago 6.0.  A1c WNL, 4 months ago.  No issues with cravings.    2. Other hyperlipidemia LDL on 07/06/2021 was 123. Currently not taking any medications.   3. Unspecified Bipolar and Related Disorder She has been treated by the psych doctor with Latuda, Lamictal, Depakote.  They also treated her mood related sleep disorder with Vistaril and Trazodone.    Assessment/Plan:  No orders of the defined types were placed in this encounter.   There are no discontinued medications.   No orders of the defined types were placed in this encounter.    1. Insulin resistance Patient declines labs today and will come in fasting to next office for them.  Decreased carbohydrates increased proteins and fiber.  Check fasting insulin next visit.   2. Other hyperlipidemia Reminded patient to focus on lean proteins and decreased food higher in saturated and trans fats. No need for medications at this time, continue PNP and increase  exercise.  3. Unspecified Bipolar and Related Disorder Her mood appears to be stable today.  Importance of regular follow up with her psych team, and stressed, exercise.   4. Obesity,current BMI 35.9 1) Do not eat out more than 2 days per week. 2) Patient will focus on eating on plan for lunch everyday.   [name] is currently in the action stage of change. As such, her goal is to continue with weight loss efforts. She has agreed to the Category 1 Plan and keeping a food journal and adhering to recommended goals of 1100-1200 calories and 90+ protein daily.    Exercise goals: All adults should avoid inactivity. Some physical activity is better than none, and adults who participate in any amount of physical activity gain some health benefits.  Behavioral modification strategies: decreasing eating out, no skipping meals, and planning for success.  [name] has agreed to follow-up with our clinic in 3-4 weeks. She was informed of the importance of frequent follow-up visits to maximize her success with intensive lifestyle modifications for her multiple health conditions.   Objective:   Blood pressure 115/72, pulse 60, temperature 97.9 F (36.6 C), height '5\' 9"'$  (1.753 m), weight 243 lb (110.2 kg), SpO2 99 %. Body mass index is 35.88 kg/m.  General: Cooperative, alert, well developed, in no acute distress. HEENT: Conjunctivae and lids unremarkable. Cardiovascular: Regular rhythm.  Lungs: Normal work of breathing. Neurologic: No focal deficits.   Lab Results  Component Value Date   CREATININE  0.85 08/18/2021   BUN 13 08/17/2021   NA 137 08/17/2021   K 4.3 08/17/2021   CL 101 08/17/2021   CO2 27 08/17/2021   Lab Results  Component Value Date   ALT 18 08/17/2021   AST 18 08/17/2021   ALKPHOS 64 08/17/2021   BILITOT 0.5 08/17/2021   Lab Results  Component Value Date   HGBA1C 5.2 07/06/2021   HGBA1C 5.1 02/15/2021   HGBA1C 5.1 12/29/2016   HGBA1C 5.1 07/19/2016   HGBA1C 5.4  09/25/2014   Lab Results  Component Value Date   INSULIN 6.0 07/06/2021   INSULIN 9.3 02/15/2021   Lab Results  Component Value Date   TSH 2.660 02/15/2021   Lab Results  Component Value Date   CHOL 190 07/06/2021   HDL 56 07/06/2021   LDLCALC 123 (H) 07/06/2021   TRIG 56 07/06/2021   CHOLHDL 3.4 07/06/2021   Lab Results  Component Value Date   VD25OH 74.2 07/06/2021   VD25OH 49.4 02/15/2021   Lab Results  Component Value Date   WBC 7.3 08/18/2021   HGB 13.5 08/18/2021   HCT 41.2 08/18/2021   MCV 93.6 08/18/2021   PLT 201 08/18/2021   Lab Results  Component Value Date   IRON 111 07/19/2020   TIBC 286 07/19/2020   FERRITIN 63 07/19/2020   Attestation Statements:   Reviewed by clinician on day of visit: allergies, medications, problem list, medical history, surgical history, family history, social history, and previous encounter notes.  I, Davy Pique, RMA, am acting as Location manager for [employer], DO.    I have reviewed the above documentation for accuracy and completeness, and I agree with the above. Marjory Sneddon, D.O.  The [place] was signed into law in 2016 which includes the topic of electronic health records.  This provides immediate access to information in MyChart.  This includes consultation notes, operative notes, office notes, lab results and pathology reports.  If you have any questions about what you read please let us know at your next visit so we can discuss your concerns and take corrective action if need be.  We are right here with you.

## 2021-12-13 NOTE — Progress Notes (Signed)
Chief Complaint:   OBESITY [name] is here to discuss her progress with her obesity treatment plan along with follow-up of her obesity related diagnoses. [name] is on the Category 1 Plan and states she is following her eating plan approximately 70% of the time. [name] states she is doing yard work 120 minutes 1 times per week.  Today's visit was #: 18 Starting weight: 270 lbs Starting date: 02/15/2021 Today's weight: 241 lbs Today's date: 12/06/2021 Total lbs lost to date: 29 lbs Total lbs lost since last in-office visit: 2  Interim History: This is [name]'s 1st visit with me--Dr. Jenetta Downer patient. She voices that living with her mother has been very trying and makes it difficult to follow plan. She does not like a lot of meats. Does drink [employer] shake (150 cal, 30g Prot). Likely going to her brother's for Thanksgiving.  Subjective:   1. Other hyperlipidemia [name]'s last LDL was 123, HDL was 56 and Trigly was 56.  Assessment/Plan:   1. Other hyperlipidemia Will repeat labs at next appointment or in Jan 2024.  2. Obesity,current BMI 35.6 [name] is currently in the action stage of change. As such, her goal is to continue with weight loss efforts. She has agreed to the Category 2 Plan.   Exercise goals: All adults should avoid inactivity. Some physical activity is better than none, and adults who participate in any amount of physical activity gain some health benefits.  Behavioral modification strategies: increasing lean protein intake, meal planning and cooking strategies, keeping healthy foods in the home, and planning for success.  [name] has agreed to follow-up with our clinic in 3 weeks. She was informed of the importance of frequent follow-up visits to maximize her success with intensive lifestyle modifications for her multiple health conditions.   Objective:   Blood pressure 105/69, pulse 60, temperature 98.1 F (36.7 C), height '5\' 9"'$  (1.753 m), weight 241 lb (109.3 kg),  SpO2 98 %. Body mass index is 35.59 kg/m.  General: Cooperative, alert, well developed, in no acute distress. HEENT: Conjunctivae and lids unremarkable. Cardiovascular: Regular rhythm.  Lungs: Normal work of breathing. Neurologic: No focal deficits.   Lab Results  Component Value Date   CREATININE 0.85 08/18/2021   BUN 13 08/17/2021   NA 137 08/17/2021   K 4.3 08/17/2021   CL 101 08/17/2021   CO2 27 08/17/2021   Lab Results  Component Value Date   ALT 18 08/17/2021   AST 18 08/17/2021   ALKPHOS 64 08/17/2021   BILITOT 0.5 08/17/2021   Lab Results  Component Value Date   HGBA1C 5.2 07/06/2021   HGBA1C 5.1 02/15/2021   HGBA1C 5.1 12/29/2016   HGBA1C 5.1 07/19/2016   HGBA1C 5.4 09/25/2014   Lab Results  Component Value Date   INSULIN 6.0 07/06/2021   INSULIN 9.3 02/15/2021   Lab Results  Component Value Date   TSH 2.660 02/15/2021   Lab Results  Component Value Date   CHOL 190 07/06/2021   HDL 56 07/06/2021   LDLCALC 123 (H) 07/06/2021   TRIG 56 07/06/2021   CHOLHDL 3.4 07/06/2021   Lab Results  Component Value Date   VD25OH 74.2 07/06/2021   VD25OH 49.4 02/15/2021   Lab Results  Component Value Date   WBC 7.3 08/18/2021   HGB 13.5 08/18/2021   HCT 41.2 08/18/2021   MCV 93.6 08/18/2021   PLT 201 08/18/2021   Lab Results  Component Value Date   IRON 111 07/19/2020   TIBC  286 07/19/2020   FERRITIN 63 07/19/2020   Attestation Statements:   Reviewed by clinician on day of visit: allergies, medications, problem list, medical history, surgical history, family history, social history, and previous encounter notes.  I, Elnora Morrison, RMA am acting as transcriptionist for Coralie Common, MD.  I have reviewed the above documentation for accuracy and completeness, and I agree with the above. - Coralie Common, MD

## 2021-12-17 ENCOUNTER — Other Ambulatory Visit: Payer: Self-pay | Admitting: Adult Health

## 2021-12-20 ENCOUNTER — Encounter (INDEPENDENT_AMBULATORY_CARE_PROVIDER_SITE_OTHER): Payer: Self-pay | Admitting: Family Medicine

## 2021-12-20 ENCOUNTER — Ambulatory Visit (INDEPENDENT_AMBULATORY_CARE_PROVIDER_SITE_OTHER): Payer: PPO | Admitting: Family Medicine

## 2021-12-20 ENCOUNTER — Telehealth (INDEPENDENT_AMBULATORY_CARE_PROVIDER_SITE_OTHER): Payer: PPO | Admitting: Psychology

## 2021-12-20 VITALS — BP 110/78 | HR 58 | Temp 97.9°F | Ht 69.0 in | Wt 241.2 lb

## 2021-12-20 DIAGNOSIS — E669 Obesity, unspecified: Secondary | ICD-10-CM

## 2021-12-20 DIAGNOSIS — F5089 Other specified eating disorder: Secondary | ICD-10-CM

## 2021-12-20 DIAGNOSIS — Z6835 Body mass index (BMI) 35.0-35.9, adult: Secondary | ICD-10-CM | POA: Diagnosis not present

## 2021-12-20 DIAGNOSIS — F909 Attention-deficit hyperactivity disorder, unspecified type: Secondary | ICD-10-CM

## 2021-12-20 DIAGNOSIS — E88819 Insulin resistance, unspecified: Secondary | ICD-10-CM

## 2021-12-20 DIAGNOSIS — E538 Deficiency of other specified B group vitamins: Secondary | ICD-10-CM

## 2021-12-20 DIAGNOSIS — E559 Vitamin D deficiency, unspecified: Secondary | ICD-10-CM

## 2021-12-20 DIAGNOSIS — F319 Bipolar disorder, unspecified: Secondary | ICD-10-CM | POA: Diagnosis not present

## 2021-12-20 NOTE — Progress Notes (Signed)
Office: [phone]  /  Fax: [phone]    Date: December 20, 2021    Appointment Start Time: 11:03am Duration: 29 minutes Provider: Glennie Isle, Psy.D. Type of Session: Individual Therapy  Location of Patient: Home (private location) Location of Provider: Provider's Home (private office) Type of Contact: Telepsychological Visit via MyChart Video Visit  Session Content: This provider called [name] at 11:02am as she did not present for today's appointment. She stated she was in the process of joining. As such, today's appointment was initiated 3 minutes late. [name] is a 51 y.o. female presenting for a follow-up appointment to address the previously established treatment goal of increasing coping skills.Today's appointment was a telepsychological visit. [name] provided verbal consent for today's telepsychological appointment and she is aware she is responsible for securing confidentiality on her end of the session. Prior to proceeding with today's appointment, [name]'s physical location at the time of this appointment was obtained as well a phone number she could be reached at in the event of technical difficulties. Oral and this provider participated in today's telepsychological service.   This provider conducted a brief check-in. Bekka stated she is "completely exhausted" due to work, adding she is off for the next few days. During her recent appointment with the clinic, she was changed to the Category 2 meal plan and provided a handout for eating out. A risk assessment was completed. [name] denied experiencing suicidal, self-harm, and homicidal ideation, plan, and intent since the last appointment with this provider. She continues to acknowledge understanding regarding the importance of reaching out to trusted individuals and/or emergency resources if she is unable to ensure safety. She described future plans that include ongoing efforts to improve her well-being and eating habits. Due to  the upcoming holiday(s), psychoeducation regarding making better choices and engaging in portion control during the holidays/celebrations was provided. More specifically, this provider discussed the following strategies: coming to meals hungry, but not starving; avoid filling up on appetizers; managing portion sizes; not completely depriving yourself; making the plate colorful (e.g., vegetables); pacing yourself (e.g., waiting 10 minutes before going back for seconds); taking advantage of the nutritious foods; practicing mindfulness; staying hydrated; and avoid bringing home leftovers. Overall, [name] was receptive to today's appointment as evidenced by openness to sharing, responsiveness to feedback, and willingness to implement discussed strategies .  Mental Status Examination:  Appearance: neat Behavior: appropriate to circumstances Mood: neutral Affect: mood congruent Speech: WNL Eye Contact: appropriate Psychomotor Activity: WNL Gait: unable to assess Thought Process: linear, logical, and goal directed and denies suicidal, homicidal, and self-harm ideation, plan and intent  Thought Content/Perception: no hallucinations, delusions, bizarre thinking or behavior endorsed or observed Orientation: AAOx4 Memory/Concentration: memory, attention, language, and fund of knowledge intact  Insight: good Judgment: good  Interventions:  Conducted a brief chart review Conducted a risk assessment Provided empathic reflections and validation Provided positive reinforcement Employed supportive psychotherapy interventions to facilitate reduced distress and to improve coping skills with identified stressors Psychoeducation provided regarding strategies for celebrations/holidays/vacations  DSM-5 Diagnosis(es):  F50.89 Other Specified Feeding or Eating Disorder, Emotional and Binge Eating Behaviors, F90.9 Unspecified Attention-Deficit/Hyperactivity Disorder , and F31.9 Unspecified Bipolar and Related  Disorder  Treatment Goal & Progress: During the initial appointment with this provider, the following treatment goal was established: increase coping skills. [name] has demonstrated progress in her goal as evidenced by increased awareness of hunger patterns, increased awareness of triggers for emotional eating behaviors, reduction in emotional eating behaviors, and reduction in binge eating behaviors. [name] also  continues to demonstrate willingness to engage in learned skill(s).   Plan: The next appointment is scheduled for 01/17/2022 at 11am, which will be via MyChart Video Visit. The next session will focus on working towards the established treatment goal and termination. [name] reported she will continue with her primary therapist.

## 2021-12-21 LAB — BASIC METABOLIC PANEL
BUN/Creatinine Ratio: 11 (ref 9–23)
BUN: 10 mg/dL (ref 6–24)
CO2: 25 mmol/L (ref 20–29)
Calcium: 10.2 mg/dL (ref 8.7–10.2)
Chloride: 103 mmol/L (ref 96–106)
Creatinine, Ser: 0.92 mg/dL (ref 0.57–1.00)
Glucose: 86 mg/dL (ref 70–99)
Potassium: 4.8 mmol/L (ref 3.5–5.2)
Sodium: 143 mmol/L (ref 134–144)
eGFR: 75 mL/min/{1.73_m2} (ref >59)

## 2021-12-21 LAB — VITAMIN D 25 HYDROXY (VIT D DEFICIENCY, FRACTURES): Vit D, 25-Hydroxy: 66.8 ng/mL (ref 30.0–100.0)

## 2021-12-21 LAB — VITAMIN B12: Vitamin B-12: 1389 pg/mL — ABNORMAL HIGH (ref 232–1245)

## 2022-01-07 ENCOUNTER — Other Ambulatory Visit: Payer: Self-pay | Admitting: Adult Health

## 2022-01-10 ENCOUNTER — Ambulatory Visit (INDEPENDENT_AMBULATORY_CARE_PROVIDER_SITE_OTHER): Payer: PPO | Admitting: Physician Assistant

## 2022-01-10 ENCOUNTER — Encounter (INDEPENDENT_AMBULATORY_CARE_PROVIDER_SITE_OTHER): Payer: Self-pay | Admitting: Physician Assistant

## 2022-01-10 VITALS — BP 136/82 | HR 81 | Temp 97.4°F | Ht 69.0 in | Wt 239.0 lb

## 2022-01-10 DIAGNOSIS — E88819 Insulin resistance, unspecified: Secondary | ICD-10-CM | POA: Diagnosis not present

## 2022-01-10 DIAGNOSIS — F5089 Other specified eating disorder: Secondary | ICD-10-CM | POA: Diagnosis not present

## 2022-01-10 DIAGNOSIS — E669 Obesity, unspecified: Secondary | ICD-10-CM

## 2022-01-10 DIAGNOSIS — E559 Vitamin D deficiency, unspecified: Secondary | ICD-10-CM | POA: Diagnosis not present

## 2022-01-10 DIAGNOSIS — Z6835 Body mass index (BMI) 35.0-35.9, adult: Secondary | ICD-10-CM | POA: Diagnosis not present

## 2022-01-10 DIAGNOSIS — E538 Deficiency of other specified B group vitamins: Secondary | ICD-10-CM

## 2022-01-11 ENCOUNTER — Other Ambulatory Visit: Payer: Self-pay | Admitting: Adult Health

## 2022-01-12 ENCOUNTER — Other Ambulatory Visit: Payer: Self-pay | Admitting: Adult Health

## 2022-01-13 ENCOUNTER — Other Ambulatory Visit: Payer: Self-pay | Admitting: Neurology

## 2022-01-17 ENCOUNTER — Telehealth (INDEPENDENT_AMBULATORY_CARE_PROVIDER_SITE_OTHER): Payer: PPO | Admitting: Psychology

## 2022-01-17 DIAGNOSIS — F909 Attention-deficit hyperactivity disorder, unspecified type: Secondary | ICD-10-CM | POA: Diagnosis not present

## 2022-01-17 DIAGNOSIS — F5089 Other specified eating disorder: Secondary | ICD-10-CM

## 2022-01-17 DIAGNOSIS — F319 Bipolar disorder, unspecified: Secondary | ICD-10-CM

## 2022-01-17 NOTE — Progress Notes (Signed)
  Office: [phone]  /  Fax: [phone]    Date: January 17, 2022    Appointment Start Time: 11:01am Duration: 30 minutes Provider: Glennie Isle, Psy.D. Type of Session: Individual Therapy  Location of Patient: Home (private location) Location of Provider: Provider's Home (private office) Type of Contact: Telepsychological Visit via MyChart Video Visit  Session Content: Toshiye is a 51 y.o. female presenting for a follow-up appointment to address the previously established treatment goal of increasing coping skills.Today's appointment was a telepsychological visit. Ralene provided verbal consent for today's telepsychological appointment and she is aware she is responsible for securing confidentiality on her end of the session. Prior to proceeding with today's appointment, Francheska's physical location at the time of this appointment was obtained as well a phone number she could be reached at in the event of technical difficulties. Luvina and this provider participated in today's telepsychological service.   This provider conducted a brief check-in. Shaquira stated she lost an additional two pounds, noting, "Everything is more comfortable." She also discussed stocking her new refrigerator with better choices. A risk assessment was completed. Chelsia denied experiencing suicidal, self-harm, and homicidal ideation, plan, and intent since the last appointment with this provider. She continues to acknowledge understanding regarding the importance of reaching out to trusted individuals and/or emergency resources if she is unable to ensure safety. She described future plans that include ongoing efforts to improve her well-being and eating habits. Moreover, this provider and Savannaha discussed what went well during Thanksgiving (e.g., implementation of previously discussed strategies) and what she can do differently during future holidays/celebrations. She continues to report a reduction in emotional and binge  eating behaviors and noted an improvement in eating regularly while at work and not eating out. Overall, Charnae was receptive to today's appointment as evidenced by openness to sharing, responsiveness to feedback, and willingness to continue engaging in learned skills.  Mental Status Examination:  Appearance: neat Behavior: appropriate to circumstances Mood: neutral Affect: mood congruent Speech: WNL Eye Contact: appropriate Psychomotor Activity: WNL Gait: unable to assess Thought Process: linear, logical, and goal directed and denies suicidal, homicidal, and self-harm ideation, plan and intent  Thought Content/Perception: no hallucinations, delusions, bizarre thinking or behavior endorsed or observed Orientation: AAOx4 Memory/Concentration: memory, attention, language, and fund of knowledge intact  Insight: good Judgment: good  Interventions:  Conducted a brief chart review Conducted a risk assessment Provided empathic reflections and validation Reviewed content from the previous session Provided positive reinforcement Employed supportive psychotherapy interventions to facilitate reduced distress and to improve coping skills with identified stressors  DSM-5 Diagnosis(es):  F50.89 Other Specified Feeding or Eating Disorder, Emotional and Binge Eating Behaviors, F90.9 Unspecified Attention-Deficit/Hyperactivity Disorder , and F31.9 Unspecified Bipolar and Related Disorder  Treatment Goal & Progress: During the initial appointment with this provider, the following treatment goal was established: increase coping skills. Joss demonstrated progress in her goal as evidenced by increased awareness of hunger patterns, increased awareness of triggers for emotional eating behaviors, reduction in emotional eating behaviors, and reduction in binge eating behaviors. Dominick also continues to demonstrate willingness to engage in learned skill(s).   Plan: As previously planned, today was Alexis's  last appointment with this provider. Jalise will continue with her primary therapist.  She acknowledged understanding that she may request a follow-up appointment with this provider in the future as long as she is still established with the clinic. No further follow-up planned by this provider.

## 2022-01-17 NOTE — Progress Notes (Signed)
Chief Complaint:   OBESITY [name] is here to discuss her progress with her obesity treatment plan along with follow-up of her obesity related diagnoses. [name] is on the Category 2 Plan and states she is following her eating plan approximately 65% of the time. 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  Today's visit was #: 104 Starting weight: 270 LBS Starting date: 02/15/2021 Today's weight: 241 LBS Today's date: 12/20/2021 Total lbs lost to date: 29 LBS Total lbs lost since last in-office visit: 0  Interim History: Breakfast protein shake, 153 bread.  Lunch Kraut  hot dog [employer], fruit cup.  Dinner [place], [place], chips, salsa, p[place].  Patient is still struggling with following meal plan.  Subjective:   1. Insulin resistance Patient was not fasting again today, she cannot come in on her own in the morning.  We will check BMP today.  07/06/2021 it was 6.0.  Little weight loss since last checked.  2. Vitamin D deficiency Vitamin D level was 74.2 about 5-1/2 months ago.  No dose change at that time, very little change in weight.  Patient is taking 5000 IU OTC daily.  3. B12 deficiency B12 has been oversupplemented level is greater than 2005 and half months ago.  Discontinued B12 supplements for about 4 months now.  Assessment/Plan:  No orders of the defined types were placed in this encounter.   There are no discontinued medications.   No orders of the defined types were placed in this encounter.    1. Insulin resistance Continue PNT and weight loss avoid progression of prediabetes, start exercise.  2. Vitamin D deficiency Check vitamin D today.  Continue 5000 IU OTC until next office visit.  3. B12 deficiency Recheck B12 level.  Continue to hold OTC supplements and follow PNP.  4. Obesity,current BMI 35.6 Patient is to have right food journal, track calories, grams of proteins each day.  Patient will bring journal into next  office visit.  [name] is currently in the action stage of change. As such, her goal is to continue with weight loss efforts. She has agreed to the Category 2 Plan, patient will journal intake.  Exercise goals:  Start walking 5 to 10 minutes every day.  Behavioral modification strategies: increasing lean protein intake, decreasing simple carbohydrates, and keeping a strict food journal.  [name] has agreed to follow-up with our clinic in 3 weeks. She was informed of the importance of frequent follow-up visits to maximize her success with intensive lifestyle modifications for her multiple health conditions.   [name] was informed we would discuss her lab results at her next visit unless there is a critical issue that needs to be addressed sooner. [name] agreed to keep her next visit at the agreed upon time to discuss these results.  Objective:   Blood pressure 110/78, pulse (!) 58, temperature 97.9 F (36.6 C), height '5\' 9"'$  (1.753 m), weight 241 lb 3.2 oz (109.4 kg), SpO2 98 %. Body mass index is 35.62 kg/m.  General: Cooperative, alert, well developed, in no acute distress. HEENT: Conjunctivae and lids unremarkable. Cardiovascular: Regular rhythm.  Lungs: Normal work of breathing. Neurologic: No focal deficits.   Lab Results  Component Value Date   CREATININE 0.92 12/20/2021   BUN 10 12/20/2021   NA 143 12/20/2021   K 4.8 12/20/2021   CL 103 12/20/2021   CO2 25 12/20/2021   Lab Results  Component Value Date   ALT  18 08/17/2021   AST 18 08/17/2021   ALKPHOS 64 08/17/2021   BILITOT 0.5 08/17/2021   Lab Results  Component Value Date   HGBA1C 5.2 07/06/2021   HGBA1C 5.1 02/15/2021   HGBA1C 5.1 12/29/2016   HGBA1C 5.1 07/19/2016   HGBA1C 5.4 09/25/2014   Lab Results  Component Value Date   INSULIN 6.0 07/06/2021   INSULIN 9.3 02/15/2021   Lab Results  Component Value Date   TSH 2.660 02/15/2021   Lab Results  Component Value Date   CHOL 190 07/06/2021   HDL 56  07/06/2021   LDLCALC 123 (H) 07/06/2021   TRIG 56 07/06/2021   CHOLHDL 3.4 07/06/2021   Lab Results  Component Value Date   VD25OH 66.8 12/20/2021   VD25OH 74.2 07/06/2021   VD25OH 49.4 02/15/2021   Lab Results  Component Value Date   WBC 7.3 08/18/2021   HGB 13.5 08/18/2021   HCT 41.2 08/18/2021   MCV 93.6 08/18/2021   PLT 201 08/18/2021   Lab Results  Component Value Date   IRON 111 07/19/2020   TIBC 286 07/19/2020   FERRITIN 63 07/19/2020    Attestation Statements:   Reviewed by clinician on day of visit: allergies, medications, problem list, medical history, surgical history, family history, social history, and previous encounter notes.  I, Davy Pique, RMA, am acting as Location manager for [employer], DO.   I have reviewed the above documentation for accuracy and completeness, and I agree with the above. Marjory Sneddon, D.O.  The [place] was signed into law in 2016 which includes the topic of electronic health records.  This provides immediate access to information in MyChart.  This includes consultation notes, operative notes, office notes, lab results and pathology reports.  If you have any questions about what you read please let us know at your next visit so we can discuss your concerns and take corrective action if need be.  We are right here with you.

## 2022-01-26 ENCOUNTER — Encounter: Payer: Self-pay | Admitting: Family Medicine

## 2022-01-26 NOTE — Progress Notes (Signed)
Chief Complaint:   OBESITY [name] is here to discuss her progress with her obesity treatment plan along with follow-up of her obesity related diagnoses. [name] is on the Category 2 Plan and states she is following her eating plan approximately 60% of the time. [name] states she is moving around a lot.  Today's visit was #: 20 Starting weight: 270 lbs Starting date: 02/15/2021 Today's weight: 239 lbs Today's date: 01/10/2022 Total lbs lost to date: 31 lbs Total lbs lost since last in-office visit: 2  Interim History: [name] has done well with weight loss.  Working unusual hours/schedule and this has made following the nutrition plan a little more difficult, but has been learning how to change meals to suit new schedule.  Subjective:   1. Insulin resistance [name]'s last A1c at 5.2 on 07/06/2021-at goal.  No medications.  Working on [employer] plan, decreasing simple carbs, and exercise to promote weight loss.  2. Vitamin D deficiency [name] is taking over-the-counter vitamin D at [place] has been doing his long-term.  Last vitamin D level 66.8 on 12/20/2021.  3. B12 deficiency [name]'s B12 level of 1389 decreased from 2000.  She reports she had stopped all B12 supplements and we discussed her diet is rich in B12 and she does not need a B12 supplementation at this time.   4. Other Specified Feeding or Eating Disorder, Emotional and Binge Eating Behaviors [name] is working with Dr. Mallie Mussel for emotional eating/cravings strategies and feels this is helpful to avoid binge eating.  Assessment/Plan:   1. Insulin resistance Continue prescribed nutrition plan to decrease simple carbohydrates, decrease saturated fat, increase lean proteins and exercise to promote weight loss.e   2. Vitamin D deficiency Continue over the counter Vit D daily.  3. B12 deficiency Continue healthy eating plan and exercise.  4. Other Specified Feeding or Eating Disorder, Emotional and  Binge Eating Behaviors Continue emotional eating strategies/working with Dr. Mallie Mussel.  5. Obesity,current BMI 35.4 [name] is currently in the action stage of change. As such, her goal is to continue with weight loss efforts. She has agreed to the Category 2 Plan.   Exercise goals: As is.  Behavioral modification strategies: increasing lean protein intake, decreasing simple carbohydrates, and holiday eating strategies .  [name] has agreed to follow-up with our clinic in 3 weeks. She was informed of the importance of frequent follow-up visits to maximize her success with intensive lifestyle modifications for her multiple health conditions.   Objective:   Blood pressure 136/82, pulse 81, temperature (!) 97.4 F (36.3 C), height '5\' 9"'$  (1.753 m), weight 239 lb (108.4 kg), SpO2 99 %. Body mass index is 35.29 kg/m.  General: Cooperative, alert, well developed, in no acute distress. HEENT: Conjunctivae and lids unremarkable. Cardiovascular: Regular rhythm.  Lungs: Normal work of breathing. Neurologic: No focal deficits.   Lab Results  Component Value Date   CREATININE 0.92 12/20/2021   BUN 10 12/20/2021   NA 143 12/20/2021   K 4.8 12/20/2021   CL 103 12/20/2021   CO2 25 12/20/2021   Lab Results  Component Value Date   ALT 18 08/17/2021   AST 18 08/17/2021   ALKPHOS 64 08/17/2021   BILITOT 0.5 08/17/2021   Lab Results  Component Value Date   HGBA1C 5.2 07/06/2021   HGBA1C 5.1 02/15/2021   HGBA1C 5.1 12/29/2016   HGBA1C 5.1 07/19/2016   HGBA1C 5.4 09/25/2014   Lab Results  Component Value Date   INSULIN 6.0 07/06/2021  INSULIN 9.3 02/15/2021   Lab Results  Component Value Date   TSH 2.660 02/15/2021   Lab Results  Component Value Date   CHOL 190 07/06/2021   HDL 56 07/06/2021   LDLCALC 123 (H) 07/06/2021   TRIG 56 07/06/2021   CHOLHDL 3.4 07/06/2021   Lab Results  Component Value Date   VD25OH 66.8 12/20/2021   VD25OH 74.2 07/06/2021   VD25OH 49.4  02/15/2021   Lab Results  Component Value Date   WBC 7.3 08/18/2021   HGB 13.5 08/18/2021   HCT 41.2 08/18/2021   MCV 93.6 08/18/2021   PLT 201 08/18/2021   Lab Results  Component Value Date   IRON 111 07/19/2020   TIBC 286 07/19/2020   FERRITIN 63 07/19/2020   Attestation Statements:   Reviewed by clinician on day of visit: allergies, medications, problem list, medical history, surgical history, family history, social history, and previous encounter notes.  I, Brendell Tyus, am acting as transcriptionist for [employer], PA.  I have reviewed the above documentation for accuracy and completeness, and I agree with the above. -  Marcellina Jonsson,PA-C

## 2022-01-31 ENCOUNTER — Encounter (INDEPENDENT_AMBULATORY_CARE_PROVIDER_SITE_OTHER): Payer: Self-pay | Admitting: Family Medicine

## 2022-01-31 ENCOUNTER — Ambulatory Visit (INDEPENDENT_AMBULATORY_CARE_PROVIDER_SITE_OTHER): Payer: PPO | Admitting: Family Medicine

## 2022-01-31 VITALS — BP 124/78 | HR 68 | Temp 98.0°F | Ht 69.0 in | Wt 243.4 lb

## 2022-01-31 DIAGNOSIS — Z6835 Body mass index (BMI) 35.0-35.9, adult: Secondary | ICD-10-CM

## 2022-01-31 DIAGNOSIS — E669 Obesity, unspecified: Secondary | ICD-10-CM | POA: Diagnosis not present

## 2022-01-31 DIAGNOSIS — Z9109 Other allergy status, other than to drugs and biological substances: Secondary | ICD-10-CM

## 2022-02-01 ENCOUNTER — Ambulatory Visit (INDEPENDENT_AMBULATORY_CARE_PROVIDER_SITE_OTHER): Payer: PPO | Admitting: Family Medicine

## 2022-02-01 ENCOUNTER — Encounter: Payer: Self-pay | Admitting: Family Medicine

## 2022-02-01 VITALS — BP 110/60 | Ht 69.0 in | Wt 243.0 lb

## 2022-02-01 DIAGNOSIS — S43002A Unspecified subluxation of left shoulder joint, initial encounter: Secondary | ICD-10-CM | POA: Diagnosis not present

## 2022-02-01 NOTE — Progress Notes (Signed)
  [name] - 52 y.o. female MRN [id number]  Date of birth: 01/12/1971  SUBJECTIVE:  Including CC & ROS.  No chief complaint on file.   [name] is a 52 y.o. female that is presenting with left shoulder pain that is acute.  She had a pop in her shoulder about 2 weeks ago.  Having trouble with external rotation and abduction.  No history of surgery in this arm.  Does not comfort if she cues to close to the body   Review of Systems See HPI   HISTORY: Past Medical, Surgical, Social, and Family History Reviewed & Updated per EMR.   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   rt chest wall nodule removal   GANGLION CYST EXCISION  2011   lipoma removal     right bunioectomy     SHOULDER SURGERY  01/13/2011   right, partial tear   tumor resection left thigh       PHYSICAL EXAM:  VS: BP 110/60 (BP Location: Left Arm, Patient Position: Sitting)   Ht _0  (1.753 m)   Wt 243 lb (110.2 kg)   BMI 35.88 kg/m  Physical Exam Gen: NAD, alert, cooperative with exam, well-appearing MSK:  Neurovascularly intact     ASSESSMENT & PLAN:   Shoulder subluxation, left, initial encounter Acutely occurring with symptoms most consistent with a subluxation.  Has tightness in external rotation and abduction.  Has good strength. -Counseled on home exercise therapy and supportive care. -Could consider physical therapy or an injection.

## 2022-02-01 NOTE — Patient Instructions (Signed)
Good to see you Please alternate heat and ice  Please try the exercises   Please send me a message in MyChart with any questions or updates.  Please see me back in 3-4 weeks.   --Dr. Raeford Razor

## 2022-02-01 NOTE — Assessment & Plan Note (Signed)
Acutely occurring with symptoms most consistent with a subluxation.  Has tightness in external rotation and abduction.  Has good strength. -Counseled on home exercise therapy and supportive care. -Could consider physical therapy or an injection.

## 2022-02-03 DIAGNOSIS — Z6835 Body mass index (BMI) 35.0-35.9, adult: Secondary | ICD-10-CM | POA: Diagnosis not present

## 2022-02-03 DIAGNOSIS — Z9181 History of falling: Secondary | ICD-10-CM | POA: Diagnosis not present

## 2022-02-03 DIAGNOSIS — I10 Essential (primary) hypertension: Secondary | ICD-10-CM | POA: Diagnosis not present

## 2022-02-03 DIAGNOSIS — S060XAA Concussion with loss of consciousness status unknown, initial encounter: Secondary | ICD-10-CM | POA: Diagnosis not present

## 2022-02-05 ENCOUNTER — Encounter: Payer: Self-pay | Admitting: Adult Health

## 2022-02-06 NOTE — Telephone Encounter (Signed)
I called [facility]. They will fax over the note from Dr Rowe Clack. Pt was seen on 02/03/22 and I was told pt was given a work note to be excused until she is evaluated by neurology.

## 2022-02-07 ENCOUNTER — Ambulatory Visit: Payer: PPO | Admitting: Family Medicine

## 2022-02-07 NOTE — Telephone Encounter (Signed)
We have not received a note yet. I called [facility] again and requested for them to fax Dr Hazle Coca note again from 02/03/22.

## 2022-02-08 ENCOUNTER — Ambulatory Visit (INDEPENDENT_AMBULATORY_CARE_PROVIDER_SITE_OTHER): Payer: PPO | Admitting: Family Medicine

## 2022-02-08 ENCOUNTER — Ambulatory Visit: Payer: PPO | Admitting: Family Medicine

## 2022-02-08 ENCOUNTER — Encounter: Payer: Self-pay | Admitting: Family Medicine

## 2022-02-08 VITALS — BP 134/66 | Ht 69.5 in | Wt 243.0 lb

## 2022-02-08 VITALS — BP 130/90 | Ht 69.5 in | Wt 243.0 lb

## 2022-02-08 DIAGNOSIS — M7542 Impingement syndrome of left shoulder: Secondary | ICD-10-CM

## 2022-02-08 DIAGNOSIS — M79642 Pain in left hand: Secondary | ICD-10-CM | POA: Diagnosis not present

## 2022-02-08 DIAGNOSIS — M25532 Pain in left wrist: Secondary | ICD-10-CM | POA: Diagnosis not present

## 2022-02-08 DIAGNOSIS — S060X0A Concussion without loss of consciousness, initial encounter: Secondary | ICD-10-CM

## 2022-02-08 MED ORDER — KETOROLAC TROMETHAMINE 30 MG/ML IJ SOLN
30.0000 mg | Freq: Once | INTRAMUSCULAR | Status: AC
  Administered 2022-02-08: 30 mg via INTRAMUSCULAR

## 2022-02-08 NOTE — Assessment & Plan Note (Signed)
Acutely occurring after recent fall.  Continues to have pain on abduction.  Does have normal motion. -Counseled on home exercise therapy and supportive care. -Could consider further imaging or injection.

## 2022-02-08 NOTE — Assessment & Plan Note (Signed)
Acutely occurring with recent injury.  Having headache, dizziness and photophobia.  Has a history of previous concussions. -Counseled on home exercise therapy and supportive care. -Could consider physical therapy

## 2022-02-08 NOTE — Patient Instructions (Signed)
Good to see you Please try light exercises  Please try the concussion worksheet   Please send me a message in MyChart with any questions or updates.  Please see me back as scheduled.   --Dr. Raeford Razor

## 2022-02-08 NOTE — Patient Instructions (Addendum)
Get x-rays after you leave today - we will call you with the results. Wear the brace except when icing or bathing for the next 2 weeks. Follow up with [place] or Dr. Raeford Razor in 2 weeks.

## 2022-02-08 NOTE — Progress Notes (Signed)
  [name] - 52 y.o. female MRN [id number]  Date of birth: [birth year]  SUBJECTIVE:  Including CC & ROS.  No chief complaint on file.   [name] is a 52 y.o. female that is presenting with acute worsening left shoulder pain and headache and dizziness.  She had a recent fall last week.  She fell on her left side.  She has a history of 4 previous concussions.  Having photophobia.    Review of Systems See HPI   HISTORY: Past Medical, Surgical, Social, and Family History Reviewed & Updated per EMR.   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   rt chest wall nodule removal   GANGLION CYST EXCISION  2011   lipoma removal     right bunioectomy     SHOULDER SURGERY  01/13/2011   right, partial tear   tumor resection left thigh       PHYSICAL EXAM:  VS: BP 134/66   Ht 5' 9.5" (1.765 m)   Wt 243 lb (110.2 kg)   BMI 35.37 kg/m  Physical Exam Gen: NAD, alert, cooperative with exam, well-appearing MSK:  Neurovascularly intact       ASSESSMENT & PLAN:   Concussion with no loss of consciousness Acutely occurring with recent injury.  Having headache, dizziness and photophobia.  Has a history of previous concussions. -Counseled on home exercise therapy and supportive care. -Could consider physical therapy  Shoulder impingement syndrome, left Acutely occurring after recent fall.  Continues to have pain on abduction.  Does have normal motion. -Counseled on home exercise therapy and supportive care. -Could consider further imaging or injection.

## 2022-02-09 ENCOUNTER — Encounter: Payer: Self-pay | Admitting: Adult Health

## 2022-02-09 ENCOUNTER — Ambulatory Visit
Admission: RE | Admit: 2022-02-09 | Discharge: 2022-02-09 | Disposition: A | Discharge status: Home / Self Care | Payer: PPO | Source: Ambulatory Visit | Attending: Family Medicine | Admitting: Family Medicine

## 2022-02-09 ENCOUNTER — Ambulatory Visit (INDEPENDENT_AMBULATORY_CARE_PROVIDER_SITE_OTHER): Payer: PPO | Admitting: Adult Health

## 2022-02-09 ENCOUNTER — Encounter: Payer: Self-pay | Admitting: Family Medicine

## 2022-02-09 ENCOUNTER — Ambulatory Visit
Admission: RE | Admit: 2022-02-09 | Discharge: 2022-02-09 | Disposition: A | Discharge status: Home / Self Care | Payer: PPO | Source: Ambulatory Visit | Attending: Adult Health | Admitting: Adult Health

## 2022-02-09 VITALS — BP 123/84 | HR 73 | Ht 69.0 in | Wt 242.0 lb

## 2022-02-09 DIAGNOSIS — M19032 Primary osteoarthritis, left wrist: Secondary | ICD-10-CM | POA: Diagnosis not present

## 2022-02-09 DIAGNOSIS — M25532 Pain in left wrist: Secondary | ICD-10-CM

## 2022-02-09 DIAGNOSIS — M79642 Pain in left hand: Secondary | ICD-10-CM

## 2022-02-09 DIAGNOSIS — G44311 Acute post-traumatic headache, intractable: Secondary | ICD-10-CM | POA: Diagnosis not present

## 2022-02-09 DIAGNOSIS — W1800XA Striking against unspecified object with subsequent fall, initial encounter: Secondary | ICD-10-CM

## 2022-02-09 DIAGNOSIS — M19042 Primary osteoarthritis, left hand: Secondary | ICD-10-CM | POA: Diagnosis not present

## 2022-02-09 DIAGNOSIS — Z5181 Encounter for therapeutic drug level monitoring: Secondary | ICD-10-CM | POA: Diagnosis not present

## 2022-02-09 DIAGNOSIS — R519 Headache, unspecified: Secondary | ICD-10-CM | POA: Diagnosis not present
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
## 2022-02-09 NOTE — Progress Notes (Signed)
PCP: Faustino Congress, NP  Subjective:   HPI: Patient is a 52 y.o. female here for left thumb injury.  Patient was seen earlier today by Dr. Raeford Razor for concussion and shoulder injury. During that visit she forgot to mention she also injured her left thumb when she fell out of bed a few days ago. Some swelling but no bruising. Pain throughout thumb and into distal wrist on radial side. Has been icing.  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     amphetamine-dextroamphetamine (ADDERALL XR) 20 MG 24 hr capsule Take 20 mg by mouth daily.     Ascorbic Acid (VITAMIN C) 1000 MG tablet Take 1,000 mg by mouth daily.     calcium carbonate (OSCAL) 1500 (600 Ca) MG TABS tablet Take 1,500 mg by mouth in the morning and at bedtime.     Cholecalciferol (D3 5000) 125 MCG (5000 UT) capsule Take 1 capsule (5,000 Units total) by mouth daily.     divalproex (DEPAKOTE ER) 500 MG 24 hr tablet TAKE 2 TABLETS BY MOUTH ONCE DAILY AT NIGHT 180 tablet 0   docusate sodium (COLACE) 100 MG capsule Take 1 capsule (100 mg total) by mouth 2 (two) times daily. 60 capsule 0   EMGALITY 120 MG/ML SOSY INJECT '120MG'$  INTO THE SKIN EVERY 30 DAYS 1 mL 2   hydrOXYzine (VISTARIL) 50 MG capsule Take 50 mg by mouth in the morning and at bedtime.   2   lamoTRIgine (LAMICTAL) 150 MG tablet Take 300 mg by mouth at bedtime.  1   LATUDA 20 MG TABS tablet Take 20 mg by mouth every morning.     levocetirizine (XYZAL) 5 MG tablet Take 5 mg by mouth daily.     lurasidone (LATUDA) 80 MG  TABS tablet Take 1 tablet (80 mg total) by mouth daily with supper. (Along with '20mg'$  in AM for mood) 30 tablet 0   metoprolol succinate (TOPROL-XL) 100 MG 24 hr tablet Take 1 tablet (100 mg total) by mouth daily. Take with or immediately following a meal. 30 tablet 0   Multiple Vitamin (MULTIVITAMIN WITH MINERALS) TABS tablet Take 1 tablet by mouth daily.     mupirocin ointment (BACTROBAN) 2 % Apply 1 Application topically as needed (irritation).     Omega-3 Fatty Acids (OMEGA 3 PO) Take 1 capsule by mouth daily.     promethazine (PHENERGAN) 25 MG tablet Take 1 tablet (25 mg total) by mouth 2 (two) times daily as needed for nausea or vomiting. 30 tablet 1   traZODone (DESYREL) 100 MG tablet Take 1 tablet (100 mg total) by mouth at bedtime. 30 tablet 0   verapamil (CALAN-SR) 120 MG CR tablet TAKE 1 TABLET(120 MG) BY MOUTH AT BEDTIME 90 tablet 0   Zinc 50 MG TABS Take  50 mg by mouth daily.     No current facility-administered medications on file prior to visit.    Past Surgical History:  Procedure Laterality Date   ANKLE SURGERY  12/88   left    chest nodule  1990?   rt chest wall nodule removal   GANGLION CYST EXCISION  2011   lipoma removal     right bunioectomy     SHOULDER SURGERY  01/13/2011   right, partial tear   tumor resection left thigh      Allergies  Allergen Reactions   Other Itching    Steri-strip also hand rash and redness.   Adhesive [Tape] Itching and Rash    Also reacted to Steri Strips and Band-Aids.   Amoxicillin Hives   Dilaudid [Hydromorphone Hcl] Itching   Morphine Nausea And Vomiting   Penicillins Hives    Has patient had a PCN reaction causing immediate rash, facial/tongue/throat swelling, SOB or lightheadedness with hypotension: YES Has patient had a PCN reaction causing severe rash involving mucus membranes or skin necrosis: NO Has patient had a PCN reaction that required hospitalization NO Has patient had a PCN reaction occurring within the last 10 years:NO If all of the above answers are "NO", then may proceed with Cephalosporin use.   Percocet [Oxycodone-Acetaminophen] Itching   Prednisone Other (See Comments)    Pt reports Prednisone induces Manic episodes   Provera [Medroxyprogesterone Acetate] Other (See Comments)    Causes manic episodes   Ultram [Tramadol Hcl] Itching    BP (!) 130/90   Ht 5' 9.5" (1.765 m)   Wt 243 lb (110.2 kg)   BMI 35.37 kg/m       No data to display              No data to display              Objective:  Physical Exam:  Gen: NAD, comfortable in exam room  Left hand/wrist: No deformity, swelling, bruising. FROM with 5/5 strength wrist.  Able to flex and extend 1st IP and MCP joints against resistance. Collateral ligaments intact. Tenderness to palpation throughout thumb and distal radius. NVI distally.   Assessment & Plan:  1. Left thumb, wrist  injury - will obtain radiographs to assess.  Though exam is reassuring without swelling or bruising.  Suspect wrist sprain and thumb contusion.  Thumb spica brace.  Icing, tylenol if needed, topical medications.  F/u in 2 weeks.

## 2022-02-10 LAB — VALPROIC ACID LEVEL: Valproic Acid Lvl: 76 ug/mL (ref 50–100)

## 2022-02-14 ENCOUNTER — Other Ambulatory Visit: Payer: Self-pay | Admitting: Adult Health

## 2022-02-14 DIAGNOSIS — F900 Attention-deficit hyperactivity disorder, predominantly inattentive type: Secondary | ICD-10-CM | POA: Diagnosis not present

## 2022-02-14 DIAGNOSIS — F603 Borderline personality disorder: Secondary | ICD-10-CM | POA: Diagnosis not present

## 2022-02-14 DIAGNOSIS — F3181 Bipolar II disorder: Secondary | ICD-10-CM | POA: Diagnosis not present

## 2022-02-14 NOTE — Progress Notes (Signed)
Chief Complaint:   OBESITY [name] is here to discuss her progress with her obesity treatment plan along with follow-up of her obesity related diagnoses. [name] is on the Category 2 Plan and states she is following her eating plan approximately 60% of the time. [name] states she is not exercising.   Today's visit was #: 21 Starting weight: 270 lbs Starting date: 02/15/2021 Today's weight: 243 lbs Today's date: 01/31/2022 Total lbs lost to date: 27 lbs Total lbs lost since last in-office visit: +4 lbs  Interim History: Patient has struggled over the holidays with getting her protein in. Following plan 60% of the time.  Patient has questions about Wegovy and Ozempic use today.  But wants to lose weight naturally and not start a medication.    Subjective:   1. Environmental allergies Patient has allergist who gave her an inhaler which she is not using.  She has a runny nose, dry cough, especially after working with different clothing, fibers at work.   Assessment/Plan:  No orders of the defined types were placed in this encounter.   Medications Discontinued During This Encounter  Medication Reason   cetirizine (ZYRTEC) 10 MG chewable tablet Change in therapy     No orders of the defined types were placed in this encounter.    1. Environmental allergies Changed to [place] recently.  Take all medications consider changes, ie Singulair in future. Avoid exposure.   2. Obesity with current BMI of 35.9 Carba Nada pasta, edamame spaghetti and black bean spaghetti.  Start exercise, walking 15 minutes 3 days per week.   [name] is currently in the action stage of change. As such, her goal is to continue with weight loss efforts. She has agreed to the Category 2 Plan with breakfast and lunch options.    Exercise goals: All adults should avoid inactivity. Some physical activity is better than none, and adults who participate in any amount of physical activity gain some health benefits.  Start walking 3+ days per week.   Behavioral modification strategies: increasing lean protein intake and decreasing simple carbohydrates.  [name] has agreed to follow-up with our clinic in 6 weeks. She was informed of the importance of frequent follow-up visits to maximize her success with intensive lifestyle modifications for her multiple health conditions.   Objective:   Blood pressure 124/78, pulse 68, temperature 98 F (36.7 C), height '5\' 9"'$  (1.753 m), weight 243 lb 6.4 oz (110.4 kg), SpO2 99 %. Body mass index is 35.94 kg/m.  General: Cooperative, alert, well developed, in no acute distress. HEENT: Conjunctivae and lids unremarkable. Cardiovascular: Regular rhythm.  Lungs: Normal work of breathing. Neurologic: No focal deficits.   Lab Results  Component Value Date   CREATININE 0.92 12/20/2021   BUN 10 12/20/2021   NA 143 12/20/2021   K 4.8 12/20/2021   CL 103 12/20/2021   CO2 25 12/20/2021   Lab Results  Component Value Date   ALT 18 08/17/2021   AST 18 08/17/2021   ALKPHOS 64 08/17/2021   BILITOT 0.5 08/17/2021   Lab Results  Component Value Date   HGBA1C 5.2 07/06/2021   HGBA1C 5.1 02/15/2021   HGBA1C 5.1 12/29/2016   HGBA1C 5.1 07/19/2016   HGBA1C 5.4 09/25/2014   Lab Results  Component Value Date   INSULIN 6.0 07/06/2021   INSULIN 9.3 02/15/2021   Lab Results  Component Value Date   TSH 2.660 02/15/2021   Lab Results  Component Value Date   CHOL 190  07/06/2021   HDL 56 07/06/2021   LDLCALC 123 (H) 07/06/2021   TRIG 56 07/06/2021   CHOLHDL 3.4 07/06/2021   Lab Results  Component Value Date   VD25OH 66.8 12/20/2021   VD25OH 74.2 07/06/2021   VD25OH 49.4 02/15/2021   Lab Results  Component Value Date   WBC 7.3 08/18/2021   HGB 13.5 08/18/2021   HCT 41.2 08/18/2021   MCV 93.6 08/18/2021   PLT 201 08/18/2021   Lab Results  Component Value Date   IRON 111 07/19/2020   TIBC 286 07/19/2020   FERRITIN 63 07/19/2020   Attestation  Statements:   Reviewed by clinician on day of visit: allergies, medications, problem list, medical history, surgical history, family history, social history, and previous encounter notes.  Time spent on visit including pre-visit chart review and post-visit care and charting was 30 minutes.   I, Davy Pique, RMA, am acting as Location manager for [employer], DO.   I have reviewed the above documentation for accuracy and completeness, and I agree with the above. Marjory Sneddon, D.O.  The [place] was signed into law in 2016 which includes the topic of electronic health records.  This provides immediate access to information in MyChart.  This includes consultation notes, operative notes, office notes, lab results and pathology reports.  If you have any questions about what you read please let us know at your next visit so we can discuss your concerns and take corrective action if need be.  We are right here with you.

## 2022-02-20 ENCOUNTER — Ambulatory Visit (INDEPENDENT_AMBULATORY_CARE_PROVIDER_SITE_OTHER): Payer: PPO

## 2022-02-20 ENCOUNTER — Encounter: Payer: Self-pay | Admitting: Podiatry

## 2022-02-20 ENCOUNTER — Ambulatory Visit: Payer: PPO | Admitting: Podiatry

## 2022-02-20 DIAGNOSIS — I10 Essential (primary) hypertension: Secondary | ICD-10-CM | POA: Diagnosis not present

## 2022-02-20 DIAGNOSIS — M7751 Other enthesopathy of right foot: Secondary | ICD-10-CM | POA: Diagnosis not present

## 2022-02-20 DIAGNOSIS — G43001 Migraine without aura, not intractable, with status migrainosus: Secondary | ICD-10-CM | POA: Diagnosis not present

## 2022-02-20 DIAGNOSIS — S99921A Unspecified injury of right foot, initial encounter: Secondary | ICD-10-CM

## 2022-02-20 DIAGNOSIS — F319 Bipolar disorder, unspecified: Secondary | ICD-10-CM | POA: Diagnosis not present

## 2022-02-20 DIAGNOSIS — G8929 Other chronic pain: Secondary | ICD-10-CM | POA: Diagnosis not present

## 2022-02-20 MED ORDER — TRIAMCINOLONE ACETONIDE 10 MG/ML IJ SUSP
10.0000 mg | Freq: Once | INTRAMUSCULAR | Status: AC
  Administered 2022-02-20: 10 mg

## 2022-02-20 NOTE — Progress Notes (Signed)
Subjective:   Patient ID: [name], female   DOB: 52 y.o.   MRN: [id number]   HPI Patient states over the last 1 to 2 weeks and started develop pain in right big toe joint right and I am not sure what might of happened.  States it has been sore   ROS      Objective:  Physical Exam  Neurovascular status intact with inflammation pain of the first MPJ right no crepitus of the joint noted with mild fluid buildup     Assessment:  Probability for inflammatory capsulitis first MPJ right with mild hallux limitus     Plan:  H&P x-ray reviewed and discussed condition today I went ahead did sterile prep did periarticular injection around the first MPJ 3 mg dexamethasone Kenalog 5 mg Xylocaine advised on rigid bottom shoes reappoint to recheck  X-rays were negative for signs of bony injury or fracture associated with this mild narrowing of the joint

## 2022-02-21 ENCOUNTER — Encounter (INDEPENDENT_AMBULATORY_CARE_PROVIDER_SITE_OTHER): Payer: Self-pay | Admitting: Physician Assistant

## 2022-02-21 ENCOUNTER — Ambulatory Visit (INDEPENDENT_AMBULATORY_CARE_PROVIDER_SITE_OTHER): Payer: PPO | Admitting: Physician Assistant

## 2022-02-21 ENCOUNTER — Other Ambulatory Visit: Payer: Self-pay | Admitting: Podiatry

## 2022-02-21 VITALS — BP 122/78 | HR 65 | Temp 98.0°F | Ht 69.0 in | Wt 239.0 lb

## 2022-02-21 DIAGNOSIS — E7849 Other hyperlipidemia: Secondary | ICD-10-CM

## 2022-02-21 DIAGNOSIS — M7751 Other enthesopathy of right foot: Secondary | ICD-10-CM

## 2022-02-21 DIAGNOSIS — Z6835 Body mass index (BMI) 35.0-35.9, adult: Secondary | ICD-10-CM | POA: Diagnosis not present

## 2022-02-21 DIAGNOSIS — S99921A Unspecified injury of right foot, initial encounter: Secondary | ICD-10-CM

## 2022-02-21 DIAGNOSIS — I10 Essential (primary) hypertension: Secondary | ICD-10-CM

## 2022-02-21 DIAGNOSIS — E88819 Insulin resistance, unspecified: Secondary | ICD-10-CM

## 2022-02-21 DIAGNOSIS — E669 Obesity, unspecified: Secondary | ICD-10-CM

## 2022-02-22 ENCOUNTER — Ambulatory Visit (INDEPENDENT_AMBULATORY_CARE_PROVIDER_SITE_OTHER): Payer: PPO | Admitting: Family Medicine

## 2022-02-22 VITALS — BP 114/78 | Ht 69.5 in | Wt 239.0 lb

## 2022-02-22 DIAGNOSIS — M25512 Pain in left shoulder: Secondary | ICD-10-CM | POA: Diagnosis not present

## 2022-02-22 DIAGNOSIS — M25532 Pain in left wrist: Secondary | ICD-10-CM

## 2022-02-22 DIAGNOSIS — G8929 Other chronic pain: Secondary | ICD-10-CM

## 2022-02-22 MED ORDER — METHYLPREDNISOLONE ACETATE 40 MG/ML IJ SUSP
40.0000 mg | Freq: Once | INTRAMUSCULAR | Status: AC
  Administered 2022-02-22: 40 mg via INTRA_ARTICULAR

## 2022-02-22 NOTE — Progress Notes (Signed)
PCP: Faustino Congress, NP  Subjective:   HPI: Patient is a 52 y.o. female here for left thumb injury, left shoulder pain.  1/10: Patient was seen earlier today by Dr. Raeford Razor for concussion and shoulder injury. During that visit she forgot to mention she also injured her left thumb when she fell out of bed a few days ago. Some swelling but no bruising. Pain throughout thumb and into distal wrist on radial side. Has been icing.  1/24: Patient reports left wrist still bothering her whether she wears brace or not. No swelling or bruising still. Pain on radial side of wrist and thumb. Also reports having seen podiatry for right toe pain - radiographs negative for fracture - would like something to help diminish pain - asked about inserts. Also with chronic left shoulder pain and instability - several episodes of subluxation of this shoulder. Had surgery previously on right shoulder and no issues with this now. Pain bothering her enough she would like injection.  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     amphetamine-dextroamphetamine (ADDERALL XR) 20 MG 24 hr capsule Take 20 mg by mouth daily.     Ascorbic Acid (VITAMIN C) 1000 MG tablet Take 1,000 mg by mouth daily.     calcium carbonate (OSCAL) 1500 (600 Ca) MG TABS tablet Take 1,500 mg by mouth in the morning and at bedtime.     Cholecalciferol (D3 5000) 125 MCG (5000 UT) capsule Take 1 capsule (5,000 Units total) by mouth daily.     divalproex (DEPAKOTE ER) 500 MG 24 hr tablet TAKE 2 TABLETS BY MOUTH ONCE DAILY AT NIGHT 180 tablet 0   docusate sodium (COLACE) 100 MG capsule Take 1 capsule (100 mg total) by mouth 2 (two) times daily.  60 capsule 0   EMGALITY 120 MG/ML SOSY INJECT '120MG'$  INTO THE SKIN EVERY 30 DAYS 1 mL 2   hydrOXYzine (VISTARIL) 50 MG capsule Take 50 mg by mouth in the morning and at bedtime.   2   lamoTRIgine (LAMICTAL) 150 MG tablet Take 300 mg by mouth at bedtime.  1   LATUDA 20 MG TABS tablet Take 20 mg by mouth every morning.     levocetirizine (XYZAL) 5 MG tablet Take 5 mg by mouth daily.     lurasidone (LATUDA) 80 MG TABS tablet Take 1 tablet (80 mg total) by mouth daily with supper. (Along with '20mg'$  in AM for mood) 30 tablet 0   metoprolol succinate (TOPROL-XL) 100 MG 24 hr tablet Take 1 tablet (100 mg total) by mouth daily. Take with or immediately following a meal. 30 tablet 0   Multiple Vitamin (MULTIVITAMIN WITH  MINERALS) TABS tablet Take 1 tablet by mouth daily.     mupirocin ointment (BACTROBAN) 2 % Apply 1 Application topically as needed (irritation).     Omega-3 Fatty Acids (OMEGA 3 PO) Take 1 capsule by mouth daily.     promethazine (PHENERGAN) 25 MG tablet Take 1 tablet (25 mg total) by mouth 2 (two) times daily as needed for nausea or vomiting. 30 tablet 1   traZODone (DESYREL) 100 MG tablet Take 1 tablet (100 mg total) by mouth at bedtime. 30 tablet 0   verapamil (CALAN-SR) 120 MG CR tablet TAKE 1 TABLET(120 MG) BY MOUTH AT BEDTIME 90 tablet 2   Zinc 50 MG TABS Take 50 mg by mouth daily.     No current facility-administered medications on file prior to visit.    Past Surgical History:  Procedure Laterality Date   ANKLE SURGERY  12/88   left    chest nodule  1990?   rt chest wall nodule removal   GANGLION CYST EXCISION  2011   lipoma removal     right bunioectomy     SHOULDER SURGERY  01/13/2011   right, partial tear   tumor resection left thigh      Allergies  Allergen Reactions   Other Itching    Steri-strip also hand rash and redness.   Adhesive [Tape] Itching and Rash    Also reacted to Steri Strips and Band-Aids.   Amoxicillin Hives   Dilaudid [Hydromorphone Hcl] Itching   Morphine Nausea And Vomiting   Penicillins Hives    Has patient had a PCN reaction causing immediate rash, facial/tongue/throat swelling, SOB or lightheadedness with hypotension: YES Has patient had a PCN reaction causing severe rash involving mucus membranes or skin necrosis: NO Has patient had a PCN reaction that required hospitalization NO Has patient had a PCN reaction occurring within the last 10 years:NO If all of the above answers are "NO", then may proceed with Cephalosporin use.   Percocet [Oxycodone-Acetaminophen] Itching   Prednisone Other (See Comments)    Pt reports Prednisone induces Manic episodes   Provera [Medroxyprogesterone Acetate] Other (See Comments)    Causes manic episodes    Ultram [Tramadol Hcl] Itching    BP 114/78   Ht 5' 9.5" (1.765 m)   Wt 239 lb (108.4 kg)   BMI 34.79 kg/m       No data to display              No data to display              Objective:  Physical Exam:  Gen: NAD,  comfortable in exam room  Left hand/wrist: No deformity, swelling, bruising. FROM with 5/5 strength flexion and extension. Tenderness to palpation dorsal and volar wrist.  Some tenderness over snuffbox but minimal. NVI distally.  Left shoulder: No swelling, ecchymoses.  No gross deformity. No TTP AC joint, biceps tendon. FROM passively.  Active flexion and extension limited to 120 degrees.  Full IR and ER. Negative Hawkins, positive Neers. Negative Yergasons. Strength 5/5 with empty can and resisted internal/external rotation. Negative apprehension though painful. ++ Sulcus NV intact distally.   Assessment & Plan:  1. Left thumb, wrist injury - radiographs negative.  Ultrasound today reassuring as well.  No swelling or bruising.  Transition out of the wrist brace.  Icing, tylenol, topical medications if needed.  F/u in 4 weeks if still having issues.  2. Left shoulder pain - with notable instability, several subluxation episodes.  She did have MRI of this shoulder in 05/2021 showing some degenerative change of posterosuperior labrum but no obvious tear; tendinosis of supraspinatus and infraspinatus.  She did have shockwave and injections, exercise program last year though without improvement.  Given her recurrent instability advised we go ahead with ortho referral.  With severity of her pain offered and given injection today as below.  After informed written consent timeout was performed, patient was lying on right side on exam table. Left shoulder was prepped with alcohol swabs then patient's left glenohumeral space was injected with 3:1 lidocaine: depomedrol. Patient tolerated the procedure well without immediate complications.

## 2022-02-22 NOTE — Patient Instructions (Signed)
Dr. [facility] [address] [place] [phone]  Appt: 02/24/22 @ 11:15 am

## 2022-02-24 DIAGNOSIS — M25512 Pain in left shoulder: Secondary | ICD-10-CM | POA: Diagnosis not present

## 2022-03-01 ENCOUNTER — Ambulatory Visit: Payer: PPO | Admitting: Family Medicine

## 2022-03-01 NOTE — Progress Notes (Signed)
Chief Complaint:   OBESITY [name] is here to discuss her progress with her obesity treatment plan along with follow-up of her obesity related diagnoses. [name] is on the Category 2 Plan and states she is following her eating plan approximately 60% of the time. [name] states she is exercising 0 minutes 0 times per week.  Today's visit was #: 22 Starting weight: 270 lbs Starting date: 02/15/2021 Today's weight: 239 lbs Today's date: 02/21/2022 Total lbs lost to date: 31 lbs Total lbs lost since last in-office visit: 4  Interim History: [name] has done well with weight loss.  TBW loss of 11.5% over the past year.  She reports she got off track over the holidays, but was able to quickly get back on track with her nutrition plan.  She recently had a fall at home and sustained a concussion on 02/03/22, but reports she is gradually improving, still some headaches/post concussive symptoms.  Has difficulty getting in vegetables and we discussed strategies to increase vegetable intake like using [place] yogurt [place] dip.   Subjective:   1. Insulin resistance Last A1c at 5.2 on 07/06/2021-at goal.  Not on medications.  Working on decreasing simple carbs, increasing lean protein and exercise to promote weight loss, improve glycemic control and insulin resistance.  No recent labs.  Will check at next visit.  2. Essential hypertension Blood pressure at goal.  On Toprol XL 100 mg daily and Verapamil SR 120 mg at bedtime-no side effects with medications.  No hypotension.  Will check labs at next visit.  3. Other hyperlipidemia LDL of 123-decreased,  HDL of 56-stable, Trig of 56-decreased on 07/06/21-not on medications.  Working on decreasing simple carbs, decreasing sat fats, decreasing cholesterol to promote weight loss and improve lipid profile.   Check labs at next visit.  Assessment/Plan:   1. Insulin resistance Continue Prescribed Nutrition Plan and exercise to promote weight loss and improve  glycemic control and insulin resistance and prevent development of Type 2 diabetes.  2. Essential hypertension Continue Toprol, Verapamil and continue Prescribed Nutrition Plan and exercise to promote weight loss and improve blood pressure control.    3. Other hyperlipidemia Continue Prescribed Nutrition Plan and exercise to promote weight loss and improve lipids.  4. Obesity,current BMI 35.4 [name] is currently in the action stage of change. As such, her goal is to continue with weight loss efforts. She has agreed to the Category 2 Plan.   Exercise goals: All adults should avoid inactivity. Some physical activity is better than none, and adults who participate in any amount of physical activity gain some health benefits.  Behavioral modification strategies: increasing lean protein intake, decreasing simple carbohydrates, increasing vegetables, and meal planning and cooking strategies.  [name] has agreed to follow-up with our clinic in 3 weeks. She was informed of the importance of frequent follow-up visits to maximize her success with intensive lifestyle modifications for her multiple health conditions.   Objective:   Blood pressure 122/78, pulse 65, temperature 98 F (36.7 C), height '5\' 9"'$  (1.753 m), weight 239 lb (108.4 kg), SpO2 100 %. Body mass index is 35.29 kg/m.  General: Cooperative, alert, well developed, in no acute distress. HEENT: Conjunctivae and lids unremarkable. Cardiovascular: Regular rhythm.  Lungs: Normal work of breathing. Neurologic: No focal deficits.   Lab Results  Component Value Date   CREATININE 0.92 12/20/2021   BUN 10 12/20/2021   NA 143 12/20/2021   K 4.8 12/20/2021   CL 103 12/20/2021   CO2  25 12/20/2021   Lab Results  Component Value Date   ALT 18 08/17/2021   AST 18 08/17/2021   ALKPHOS 64 08/17/2021   BILITOT 0.5 08/17/2021   Lab Results  Component Value Date   HGBA1C 5.2 07/06/2021   HGBA1C 5.1 02/15/2021   HGBA1C 5.1 12/29/2016    HGBA1C 5.1 07/19/2016   HGBA1C 5.4 09/25/2014   Lab Results  Component Value Date   INSULIN 6.0 07/06/2021   INSULIN 9.3 02/15/2021   Lab Results  Component Value Date   TSH 2.660 02/15/2021   Lab Results  Component Value Date   CHOL 190 07/06/2021   HDL 56 07/06/2021   LDLCALC 123 (H) 07/06/2021   TRIG 56 07/06/2021   CHOLHDL 3.4 07/06/2021   Lab Results  Component Value Date   VD25OH 66.8 12/20/2021   VD25OH 74.2 07/06/2021   VD25OH 49.4 02/15/2021   Lab Results  Component Value Date   WBC 7.3 08/18/2021   HGB 13.5 08/18/2021   HCT 41.2 08/18/2021   MCV 93.6 08/18/2021   PLT 201 08/18/2021   Lab Results  Component Value Date   IRON 111 07/19/2020   TIBC 286 07/19/2020   FERRITIN 63 07/19/2020   Attestation Statements:   Reviewed by clinician on day of visit: allergies, medications, problem list, medical history, surgical history, family history, social history, and previous encounter notes.  I, Brendell Tyus, am acting as transcriptionist for [employer], PA.  I have reviewed the above documentation for accuracy and completeness, and I agree with the above. -  Tylesha Gibeault,PA-C

## 2022-03-07 ENCOUNTER — Encounter: Payer: Self-pay | Admitting: Adult Health

## 2022-03-08 ENCOUNTER — Ambulatory Visit
Admission: EM | Admit: 2022-03-08 | Discharge: 2022-03-08 | Disposition: A | Discharge status: Home / Self Care | Payer: PPO | Attending: Emergency Medicine | Admitting: Emergency Medicine

## 2022-03-08 DIAGNOSIS — G43501 Persistent migraine aura without cerebral infarction, not intractable, with status migrainosus: Secondary | ICD-10-CM | POA: Diagnosis not present

## 2022-03-08 DIAGNOSIS — R110 Nausea: Secondary | ICD-10-CM | POA: Diagnosis not present

## 2022-03-08 MED ORDER — METOCLOPRAMIDE HCL 5 MG/ML IJ SOLN
10.0000 mg | Freq: Once | INTRAMUSCULAR | Status: AC
  Administered 2022-03-08: 10 mg via INTRAMUSCULAR

## 2022-03-08 MED ORDER — DEXAMETHASONE SODIUM PHOSPHATE 10 MG/ML IJ SOLN
10.0000 mg | Freq: Once | INTRAMUSCULAR | Status: AC
  Administered 2022-03-08: 10 mg via INTRAMUSCULAR

## 2022-03-08 MED ORDER — KETOROLAC TROMETHAMINE 60 MG/2ML IM SOLN
60.0000 mg | Freq: Once | INTRAMUSCULAR | Status: AC
  Administered 2022-03-08: 60 mg via INTRAMUSCULAR

## 2022-03-08 NOTE — Telephone Encounter (Signed)
She had a CT scan that was unremarkable. If these headaches are new for her and not like her typical headaches she may need to go to urgent care unless Dr. Brett Fairy has an opening. I don't have anything today or tomorrow.

## 2022-03-08 NOTE — Discharge Instructions (Addendum)
Your history and physical exam appear consistent with status migrainosus.  We have given you injections in clinic to help with your migraine, Decadron, Toradol, and Reglan.  Please follow-up with neurology to ensure proper migraine management.  If you are not able to get in there, I have attached a headache clinic that might be able to see you sooner.  Please seek immediate care if you develop weakness, changes in your vision, numbness, or worsening symptoms.

## 2022-03-08 NOTE — ED Provider Notes (Signed)
[facility]    CSN: [id number] Arrival date & time: 03/08/22  1442      History   Chief Complaint Chief Complaint  Patient presents with   Migraine    HPI [name] is a 52 y.o. female.   Reports over the past two weeks she has been having more migraines than normal and more sudden onset. Sunday her migraine was severe, she is unsure what is triggering the increase in frequency. Does report a concussion in January. Reports intense left sided facial pain, specifically periorbital and frontal, typical for her migraines. Photophobia. Reports nausea, denies emesis. Has had migraine cocktail in the past, last July. Takes Depakote and Emgality once monthly. Neurology had no open appointments this week.   The history is provided by the patient.  Migraine Associated symptoms include headaches. Pertinent negatives include no chest pain, no abdominal pain and no shortness of breath.    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   CHEK2 [id number] ([employer])   Other fatigue    Pes planus    Photophobia of both eyes 05/04/2014   Sciatica    Seasonal allergies    Shortness of breath    Shortness of breath on exertion    Shoulder pain     Patient Active Problem List   Diagnosis Date Noted   Shoulder subluxation, left, initial encounter 02/01/2022   Environmental allergies 01/31/2022   Class 3 severe obesity with serious comorbidity and body mass index (BMI) of 40.0 to 44.9 in adult [facility]) 01/31/2022   Bipolar disorder ([place]) 11/28/2021   Arthritis of first metatarsophalangeal (MTP) joint of right foot 10/24/2021   Migraines 08/18/2021   Cervical radiculopathy 08/16/2021   Lipoma of left forearm 08/16/2021   Carpal tunnel syndrome on left 08/16/2021   Vitamin D deficiency 07/26/2021   Eating disorder 07/26/2021   Insulin resistance 07/06/2021   Other hyperlipidemia 07/06/2021   B12 deficiency 07/06/2021   Foot sprain, left, initial encounter 06/01/2021   Mood disorder () 04/29/2021   Sleep difficulties 04/29/2021   Other constipation 04/29/2021   Essential hypertension 04/29/2021   Sprain of ankle 03/29/2021   Subacromial bursitis of left shoulder joint 03/29/2021  Concussion with no loss of consciousness 02/07/2021   Lumbar radiculopathy 02/07/2021   Shoulder impingement syndrome, left 11/01/2020   Sprain of metacarpophalangeal joint of left thumb 10/07/2020   Posterior tibial tendinitis of right lower extremity 09/13/2020   Labral tear of hip, degenerative 09/13/2020   Attention and concentration deficit 05/07/2020   Morbid obesity ([place]) 05/07/2020   Plantar wart 05/07/2020   Sleep apnea 05/07/2020   Urinary incontinence 59/16/3846   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   Family  history of genetic disease carrier    Family history of breast cancer    Family history of colon cancer    History of multiple concussions 11/20/2017   Ataxia 11/20/2017   Cervical strain, acute, initial encounter 09/19/2016   Insomnia 02/17/2015   Right shoulder pain 12/31/2014   Episodic cluster headache, not intractable 12/02/2014   Chronic paroxysmal hemicrania, 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   Photophobia of both eyes 05/04/2014   Nausea with vomiting 05/04/2014   Bipolar I disorder, most recent episode mixed ([place]) 04/18/2014   Suicidal ideation 04/12/2014   Injury of right shoulder and upper arm 02/17/2014   Migraine with status migrainosus 01/08/2013   Chronic migraine 05/08/2012   Hypertension    Contact dermatitis 11/27/2011   Generalized anxiety disorder 10/27/2011   ADHD (attention deficit hyperactivity disorder), 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   rt chest wall nodule removal   GANGLION CYST EXCISION  2011   lipoma removal     right bunioectomy     SHOULDER SURGERY  01/13/2011   right, partial tear   tumor resection left thigh      OB History     Gravida  0   Para  0   Term  0   Preterm  0   AB  0   Living  0      SAB  0   IAB  0   Ectopic  0   Multiple  0   Live Births  0            Home Medications    Prior to Admission medications   Medication Sig Start Date End Date Taking? Authorizing Provider  ALFALFA PO Take 500 mg by mouth daily.    [provider]  amphetamine-dextroamphetamine (ADDERALL XR) 20 MG 24 hr capsule Take 20 mg by mouth daily.    [provider]  Ascorbic Acid (VITAMIN C) 1000 MG tablet Take 1,000 mg by mouth daily.    [provider]  calcium carbonate (OSCAL) 1500 (600 Ca) MG TABS tablet Take 1,500 mg by mouth in the morning and at bedtime.    [provider]  Cholecalciferol (D3 5000) 125 MCG (5000 UT) capsule Take 1 capsule (5,000 Units total) by mouth daily. 03/05/21   Opalski, [name], DO  divalproex (DEPAKOTE ER) 500 MG 24 hr tablet TAKE 2 TABLETS BY MOUTH ONCE DAILY AT NIGHT 01/16/22   Dohmeier, Asencion Partridge, MD  docusate sodium (COLACE) 100 MG capsule Take 1 capsule (100 mg total) by mouth 2 (two) times daily. 03/01/21  Opalski, Deborah, DO  EMGALITY 120 MG/ML SOSY INJECT '120MG'$  INTO THE SKIN EVERY 30 DAYS 12/20/21   Ward Givens, NP  hydrOXYzine (VISTARIL) 50 MG capsule Take 50 mg by mouth in the morning and at bedtime.  05/18/17   [provider]  lamoTRIgine (LAMICTAL) 150 MG tablet Take 300 mg by mouth at bedtime. 07/27/17   [provider]  LATUDA 20 MG TABS tablet Take 20 mg by mouth every morning. 04/28/19   [provider]  levocetirizine (XYZAL) 5 MG tablet Take 5 mg by mouth daily.    [provider]  lurasidone (LATUDA) 80 MG TABS tablet Take 1 tablet (80 mg total) by mouth daily with supper. (Along with '20mg'$  in AM for mood) 04/25/21   Opalski, [name], DO  metoprolol succinate (TOPROL-XL) 100 MG 24 hr tablet Take 1 tablet (100 mg total) by mouth daily. Take with or immediately following a meal. 01/06/17   Money, Lowry Ram, FNP  Multiple Vitamin (MULTIVITAMIN WITH MINERALS) TABS tablet Take 1 tablet by mouth daily.    [provider]  mupirocin ointment (BACTROBAN) 2 % Apply 1 Application topically as needed (irritation). 01/28/20   [provider]  Omega-3 Fatty Acids (OMEGA 3 PO) Take 1 capsule by mouth daily.    [provider]  promethazine (PHENERGAN) 25 MG tablet Take 1 tablet (25 mg total) by mouth 2 (two) times daily as needed for  nausea or vomiting. 02/02/21   Dohmeier, Asencion Partridge, MD  traZODone (DESYREL) 100 MG tablet Take 1 tablet (100 mg total) by mouth at bedtime. 04/25/21   Opalski, [name], DO  verapamil (CALAN-SR) 120 MG CR tablet TAKE 1 TABLET(120 MG) BY MOUTH AT BEDTIME 02/14/22   Ward Givens, NP  Zinc 50 MG TABS Take 50 mg by mouth daily.    [provider]    Family History Family History  Problem Relation Age of Onset   Cancer Mother    Kidney disease Mother    Hypertension Mother    Hyperlipidemia Mother    Breast cancer Mother 79       genetic testing- CHEK2 likely pathogenic variant   Endometrial cancer Mother    Obesity Father    Anxiety disorder Father    Depression Father    Heart attack Father    Heart disease Father    Hypertension Father    Bipolar disorder Father    Heart disease Maternal Aunt    Breast cancer Maternal Aunt 65   Breast cancer Maternal Aunt 65   Breast cancer Maternal Aunt 35   Breast cancer Maternal Aunt    Colon cancer Maternal Uncle 71   Heart disease Maternal Grandmother    Colon cancer Maternal Grandmother 66   Lymphoma Maternal Grandfather 84   Diabetes Paternal Grandfather    Bone cancer Cousin 45   Cervical cancer Cousin        Genetic testing- 'was positive for a gene' had a mastectomy   Kidney disease Other    Depression Other    Anxiety disorder Other    Bipolar disorder Other    Obesity Other    Migraines Neg Hx     Social History Social History   Tobacco Use   Smoking status: Never   Smokeless tobacco: Never  Vaping Use   Vaping Use: Never used  Substance Use Topics   Alcohol use: No    Alcohol/week: 0.0 standard drinks of alcohol   Drug use: No     Allergies  Other, Adhesive [tape], Amoxicillin, Dilaudid [hydromorphone hcl], Morphine, Penicillins, Percocet [oxycodone-acetaminophen], Prednisone, Provera [medroxyprogesterone acetate], and Ultram [tramadol hcl]   Review of Systems Review of Systems  Constitutional:   Negative for fever.  HENT:  Negative for sore throat.   Eyes:  Positive for photophobia.  Respiratory:  Negative for cough and shortness of breath.   Cardiovascular:  Negative for chest pain.  Gastrointestinal:  Positive for nausea. Negative for abdominal pain, diarrhea and vomiting.  Neurological:  Positive for headaches. Negative for dizziness, syncope, facial asymmetry, speech difficulty and weakness.     Physical Exam Triage Vital Signs ED Triage Vitals  Enc Vitals Group     BP 03/08/22 1455 121/76     Pulse Rate 03/08/22 1455 61     Resp 03/08/22 1455 18     Temp 03/08/22 1455 98 F (36.7 C)     Temp Source 03/08/22 1455 Oral     SpO2 03/08/22 1455 98 %     Weight --      Height --      Head Circumference --      Peak Flow --      Pain Score 03/08/22 1501 6     Pain Loc --      Pain Edu? --      Excl. in [place]? --    No data found.  Updated Vital Signs BP 121/76 (BP Location: Right Arm)   Pulse 61   Temp 98 F (36.7 C) (Oral)   Resp 18   SpO2 98%   Visual Acuity Right Eye Distance:   Left Eye Distance:   Bilateral Distance:    Right Eye Near:   Left Eye Near:    Bilateral Near:     Physical Exam Vitals and nursing note reviewed.  Constitutional:      Appearance: Normal appearance.     Comments: Pleasant 52 year old female who appears stated age.  HENT:     Head: Normocephalic and atraumatic.     Right Ear: External ear normal.     Left Ear: External ear normal.     Mouth/Throat:     Mouth: Mucous membranes are moist.  Eyes:     General: Lids are normal. Vision grossly intact.     Extraocular Movements: Extraocular movements intact.     Conjunctiva/sclera: Conjunctivae normal.     Pupils: Pupils are equal, round, and reactive to light.  Cardiovascular:     Rate and Rhythm: Normal rate and regular rhythm.     Pulses:          Radial pulses are 2+ on the right side and 2+ on the left side.     Heart sounds: Normal heart sounds, S1 normal and S2  normal. No murmur heard. Pulmonary:     Effort: Pulmonary effort is normal. No respiratory distress.     Breath sounds: Normal breath sounds.     Comments: Lungs vesicular posteriorly. Musculoskeletal:        General: Normal range of motion.     Cervical back: Normal range of motion.     Comments: Strength 5 out of 5 bilaterally in upper and lower extremities.  Skin:    General: Skin is warm and dry.     Capillary Refill: Capillary refill takes less than 2 seconds.  Neurological:     General: No focal deficit present.     Mental Status: She is alert and oriented to person, place, and time.     Cranial Nerves: Cranial  nerves 2-12 are intact.     Motor: No weakness.     Coordination: Coordination normal.  Psychiatric:        Behavior: Behavior is cooperative.      UC Treatments / Results  Labs (all labs ordered are listed, but only abnormal results are displayed) Labs Reviewed - No data to display  EKG   Radiology No results found.  Procedures Procedures (including critical care time)  Medications Ordered in UC Medications  metoCLOPramide (REGLAN) injection 10 mg (10 mg Intramuscular Given 03/08/22 1525)  dexamethasone (DECADRON) injection 10 mg (10 mg Intramuscular Given 03/08/22 1525)  ketorolac (TORADOL) injection 60 mg (60 mg Intramuscular Given 03/08/22 1525)    Initial Impression / Assessment and Plan / UC Course  I have reviewed the triage vital signs and the nursing notes.  Pertinent labs & imaging results that were available during my care of the patient were reviewed by me and considered in my medical decision making (see chart for details).  History and physical consistent with status migrainosus.  Typical left-sided frontal and orbital pain, 6 out of 10.  Photophobia. Emgality and Depakote have not been managing her symptoms well since her concussion in January.  Strength 5 out of 5 bilaterally in upper and lower extremities.  Without focal neurological deficits.   Cardiopulmonary exam unremarkable. Cranial nerves grossly intact.  VAN and LVO negative.  Recent negative CT head.  Treating with IM dexamethasone, Toradol, Reglan.  Advise follow-up with neurology or headache clinic, wherever she can get in sooner.  Return precautions discussed.  Patient verbalized understanding.     Final Clinical Impressions(s) / UC Diagnoses   Final diagnoses:  Migraine aura, persistent, with status migrainosus  Nausea without vomiting     Discharge Instructions      Your history and physical exam appear consistent with status migrainosus.  We have given you injections in clinic to help with your migraine, Decadron, Toradol, and Reglan.  Please follow-up with neurology to ensure proper migraine management.  If you are not able to get in there, I have attached a headache clinic that might be able to see you sooner.  Please seek immediate care if you develop weakness, changes in your vision, numbness, or worsening symptoms.     ED Prescriptions   None    I have reviewed the PDMP during this encounter.   [employer] Akamine, [place], [place] 03/08/22 [phone]

## 2022-03-08 NOTE — ED Triage Notes (Signed)
Pt has hx of migraines and gets monthly injections. Pt reports over the last 2 weeks she is getting "sudden onset in your face" migraines every 2 days. Reports pain to the left side of her head which is normal for her migraines. Neurology did not have anything this week.

## 2022-03-09 NOTE — Telephone Encounter (Signed)
Called pt back. Scheduled mychart appointment with [name] on 2/16 @ 8:45 am.

## 2022-03-14 ENCOUNTER — Encounter (INDEPENDENT_AMBULATORY_CARE_PROVIDER_SITE_OTHER): Payer: Self-pay | Admitting: Family Medicine

## 2022-03-14 ENCOUNTER — Ambulatory Visit (INDEPENDENT_AMBULATORY_CARE_PROVIDER_SITE_OTHER): Payer: PPO | Admitting: Family Medicine

## 2022-03-14 VITALS — BP 107/65 | HR 66 | Temp 97.9°F | Ht 69.0 in | Wt 237.0 lb

## 2022-03-14 DIAGNOSIS — W19XXXS Unspecified fall, sequela: Secondary | ICD-10-CM | POA: Diagnosis not present

## 2022-03-14 DIAGNOSIS — Z6837 Body mass index (BMI) 37.0-37.9, adult: Secondary | ICD-10-CM | POA: Insufficient documentation

## 2022-03-14 DIAGNOSIS — Z79899 Other long term (current) drug therapy: Secondary | ICD-10-CM | POA: Diagnosis not present

## 2022-03-14 DIAGNOSIS — R296 Repeated falls: Secondary | ICD-10-CM | POA: Diagnosis not present

## 2022-03-14 DIAGNOSIS — F319 Bipolar disorder, unspecified: Secondary | ICD-10-CM | POA: Diagnosis not present

## 2022-03-14 DIAGNOSIS — I10 Essential (primary) hypertension: Secondary | ICD-10-CM

## 2022-03-14 DIAGNOSIS — Z6835 Body mass index (BMI) 35.0-35.9, adult: Secondary | ICD-10-CM

## 2022-03-14 DIAGNOSIS — G43001 Migraine without aura, not intractable, with status migrainosus: Secondary | ICD-10-CM | POA: Diagnosis not present

## 2022-03-14 DIAGNOSIS — E88819 Insulin resistance, unspecified: Secondary | ICD-10-CM | POA: Diagnosis not present

## 2022-03-14 DIAGNOSIS — E7849 Other hyperlipidemia: Secondary | ICD-10-CM

## 2022-03-14 DIAGNOSIS — M25512 Pain in left shoulder: Secondary | ICD-10-CM | POA: Diagnosis not present

## 2022-03-14 DIAGNOSIS — Z6836 Body mass index (BMI) 36.0-36.9, adult: Secondary | ICD-10-CM | POA: Insufficient documentation

## 2022-03-14 DIAGNOSIS — I700 Atherosclerosis of aorta: Secondary | ICD-10-CM | POA: Diagnosis not present

## 2022-03-15 LAB — INSULIN, RANDOM: INSULIN: 7 u[IU]/mL (ref 2.6–24.9)

## 2022-03-15 LAB — LIPID PANEL WITH LDL/HDL RATIO
Cholesterol, Total: 198 mg/dL (ref 100–199)
HDL: 60 mg/dL (ref >39)
LDL Chol Calc (NIH): 122 mg/dL — ABNORMAL HIGH (ref 0–99)
LDL/HDL Ratio: 2 ratio (ref 0.0–3.2)
Triglycerides: 90 mg/dL (ref 0–149)
VLDL Cholesterol Cal: 16 mg/dL (ref 5–40)

## 2022-03-15 LAB — HEMOGLOBIN A1C
Est. average glucose Bld gHb Est-mCnc: 105 mg/dL
Hgb A1c MFr Bld: 5.3 % (ref 4.8–5.6)

## 2022-03-17 ENCOUNTER — Telehealth (INDEPENDENT_AMBULATORY_CARE_PROVIDER_SITE_OTHER): Payer: PPO | Admitting: Adult Health

## 2022-03-17 DIAGNOSIS — G43019 Migraine without aura, intractable, without status migrainosus: Secondary | ICD-10-CM | POA: Diagnosis not present
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
## 2022-03-21 ENCOUNTER — Encounter: Payer: Self-pay | Admitting: Adult Health

## 2022-03-27 NOTE — Progress Notes (Unsigned)
Chief Complaint:   OBESITY [name] is here to discuss her progress with her obesity treatment plan along with follow-up of her obesity related diagnoses. [name] is on the Category 2 Plan with breakfast and lunch options and states she is following her eating plan approximately 65% of the time. [name] states she is not exercising.   Today's visit was #: 23 Starting weight: 270 lbs Starting date: 02/15/2021 Today's weight: 239 lbs Today's date: 03/14/2022 Total lbs lost to date: 31 lbs  Total lbs lost since last in-office visit: 2 lbs  Interim History: Patient is getting her veggies and proteins in on most days.  Patient denies hunger or cravings when eating on plan.  Patient is down 2.6 lbs in fat and muscle mass increased by 0.2 lbs.   Subjective:   1. Other hyperlipidemia Patient last checked on 07/06/2021, LDL improved to 123 at that time with triglycerides at 56, HDL at 56.  Patient is not taking any medication and has been eating low-cholesterol and low-carb foods.  2. Essential hypertension BMP checked about 3 months ago and was within normal limits.  Patient with chronic headache, migraine and dizziness in which she follows with neurology for.  No changes in her chronic symptoms.  3. Insulin resistance Last fasting insulin 6.0 on 07/06/2021, has not been checked since.  A1c 5.2 at that time, but hide A1c in the past 14 years was 5.4, patient is not taking any medications.  Assessment/Plan:   Orders Placed This Encounter  Procedures   Hemoglobin A1c   Insulin, random   Lipid Panel With LDL/HDL Ratio    There are no discontinued medications.   No orders of the defined types were placed in this encounter.    1. Other hyperlipidemia Patient came fasting today for blood work.  Patient is not taking any medications.  Recheck fasting lipid panel and continue low saturated and trans fat diet. Check labs today.  - Lipid Panel With LDL/HDL Ratio  2. Essential  hypertension Continue PNP and weight loss and decrease salt intake.  3. Insulin resistance Check A1c and fasting insulin.  Continue low simple carb meal plan.  Continue with weight loss, you are doing great.  Add exercise plan as tolerated.  - Hemoglobin A1c - Insulin, random  4. BMI 35.0-35.9,adult-current bmi 35.0  5. Morbid obesity (HCC)-start bmi 39.87 Patient with concerns she is not eating enough veggies, but upon review of her diet, she is likely getting in more than 2 cups per day.  Explained she should measure just to make herself aware of how much she consumes.   [name] is currently in the action stage of change. As such, her goal is to continue with weight loss efforts. She has agreed to the Category 2 Plan with breakfast and lunch options.    Exercise goals: All adults should avoid inactivity. Some physical activity is better than none, and adults who participate in any amount of physical activity gain some health benefits.  Start walking as tolerated.  Behavioral modification strategies: increasing lean protein intake.  Sun has agreed to follow-up with our clinic in 3 weeks. She was informed of the importance of frequent follow-up visits to maximize her success with intensive lifestyle modifications for her multiple health conditions.   [name] was informed we would discuss her lab results at her next visit unless there is a critical issue that needs to be addressed sooner. [name] agreed to keep her next visit at the agreed upon time  to discuss these results.  Objective:   Blood pressure 107/65, pulse 66, temperature 97.9 F (36.6 C), height '5\' 9"'$  (1.753 m), weight 237 lb (107.5 kg), SpO2 99 %. Body mass index is 35 kg/m.  General: Cooperative, alert, well developed, in no acute distress. HEENT: Conjunctivae and lids unremarkable. Cardiovascular: Regular rhythm.  Lungs: Normal work of breathing. Neurologic: No focal deficits.   Lab Results  Component Value Date    CREATININE 0.92 12/20/2021   BUN 10 12/20/2021   NA 143 12/20/2021   K 4.8 12/20/2021   CL 103 12/20/2021   CO2 25 12/20/2021   Lab Results  Component Value Date   ALT 18 08/17/2021   AST 18 08/17/2021   ALKPHOS 64 08/17/2021   BILITOT 0.5 08/17/2021   Lab Results  Component Value Date   HGBA1C 5.3 03/14/2022   HGBA1C 5.2 07/06/2021   HGBA1C 5.1 02/15/2021   HGBA1C 5.1 12/29/2016   HGBA1C 5.1 07/19/2016   Lab Results  Component Value Date   INSULIN 7.0 03/14/2022   INSULIN 6.0 07/06/2021   INSULIN 9.3 02/15/2021   Lab Results  Component Value Date   TSH 2.660 02/15/2021   Lab Results  Component Value Date   CHOL 198 03/14/2022   HDL 60 03/14/2022   LDLCALC 122 (H) 03/14/2022   TRIG 90 03/14/2022   CHOLHDL 3.4 07/06/2021   Lab Results  Component Value Date   VD25OH 66.8 12/20/2021   VD25OH 74.2 07/06/2021   VD25OH 49.4 02/15/2021   Lab Results  Component Value Date   WBC 7.3 08/18/2021   HGB 13.5 08/18/2021   HCT 41.2 08/18/2021   MCV 93.6 08/18/2021   PLT 201 08/18/2021   Lab Results  Component Value Date   IRON 111 07/19/2020   TIBC 286 07/19/2020   FERRITIN 63 07/19/2020    Attestation Statements:   Reviewed by clinician on day of visit: allergies, medications, problem list, medical history, surgical history, family history, social history, and previous encounter notes.  I, Davy Pique, RMA, am acting as Location manager for [employer], DO.   I have reviewed the above documentation for accuracy and completeness, and I agree with the above. Marjory Sneddon, D.O.  The [place] was signed into law in 2016 which includes the topic of electronic health records.  This provides immediate access to information in MyChart.  This includes consultation notes, operative notes, office notes, lab results and pathology reports.  If you have any questions about what you read please let us know at your next visit so we can discuss your  concerns and take corrective action if need be.  We are right here with you.

## 2022-04-03 DIAGNOSIS — M25512 Pain in left shoulder: Secondary | ICD-10-CM | POA: Diagnosis not present

## 2022-04-04 ENCOUNTER — Ambulatory Visit (INDEPENDENT_AMBULATORY_CARE_PROVIDER_SITE_OTHER): Payer: PPO | Admitting: Physician Assistant

## 2022-04-04 ENCOUNTER — Encounter (INDEPENDENT_AMBULATORY_CARE_PROVIDER_SITE_OTHER): Payer: Self-pay

## 2022-04-04 DIAGNOSIS — E88819 Insulin resistance, unspecified: Secondary | ICD-10-CM

## 2022-04-04 DIAGNOSIS — E559 Vitamin D deficiency, unspecified: Secondary | ICD-10-CM

## 2022-04-04 DIAGNOSIS — E7849 Other hyperlipidemia: Secondary | ICD-10-CM

## 2022-04-05 ENCOUNTER — Ambulatory Visit: Payer: PPO | Admitting: Family Medicine

## 2022-04-05 ENCOUNTER — Ambulatory Visit (INDEPENDENT_AMBULATORY_CARE_PROVIDER_SITE_OTHER): Payer: PPO | Admitting: Physician Assistant

## 2022-04-05 ENCOUNTER — Encounter (INDEPENDENT_AMBULATORY_CARE_PROVIDER_SITE_OTHER): Payer: Self-pay | Admitting: Physician Assistant

## 2022-04-05 VITALS — BP 105/68 | HR 76 | Temp 98.1°F | Ht 69.0 in | Wt 238.0 lb

## 2022-04-05 DIAGNOSIS — Z6835 Body mass index (BMI) 35.0-35.9, adult: Secondary | ICD-10-CM

## 2022-04-05 DIAGNOSIS — E7849 Other hyperlipidemia: Secondary | ICD-10-CM

## 2022-04-05 DIAGNOSIS — E88819 Insulin resistance, unspecified: Secondary | ICD-10-CM | POA: Diagnosis not present

## 2022-04-05 DIAGNOSIS — Z6834 Body mass index (BMI) 34.0-34.9, adult: Secondary | ICD-10-CM

## 2022-04-05 DIAGNOSIS — E559 Vitamin D deficiency, unspecified: Secondary | ICD-10-CM

## 2022-04-05 NOTE — Progress Notes (Signed)
Office: [phone]  /  Fax: [phone]  WEIGHT SUMMARY AND BIOMETRICS  Vitals Temp: 98.1 F (36.7 C) BP: 105/68 Pulse Rate: 76 SpO2: 97 %   Anthropometric Measurements Height: '5\' 9"'$  (1.753 m) Weight: 238 lb (108 kg) BMI (Calculated): 35.13 Weight at Last Visit: 237 lb Weight Lost Since Last Visit: 0 lb Starting Weight: 270 lb   Body Composition  Body Fat %: 45.5 % Fat Mass (lbs): 108.4 lbs Muscle Mass (lbs): 123.4 lbs Total Body Water (lbs): 90.6 lbs Visceral Fat Rating : 12   Other Clinical Data Fasting: no Labs: no Today's Visit #: 24   HPI  Chief Complaint: OBESITY  [name] is here to discuss her progress with her obesity treatment plan. She is on the the Category 2 Plan and states she is following her eating plan approximately 60 % of the time. She states she is exercising 0 minutes 0 times per week.   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  She is going to have an MRI of the shoulder soon and will follow up with ortho- Dr. Theda Sers.  She is also being evaluated for hip pain soon.  She has been trying to remain mindful of nutrition plan and make better choices.  She has done well with weight loss overall. Total loss of 30 lbs.  Exercise has been limited by all the above. Stress level is somewhat high as was working part time, but hours have recently been cut and she is thinking she may have to get another part time position if it does not improve.     PHYSICAL EXAM:  Blood pressure 105/68, pulse 76, temperature 98.1 F (36.7 C), height '5\' 9"'$  (1.753 m), weight 238 lb (108 kg), SpO2 97 %. Body mass index is 35.15 kg/m.  General: She is overweight, cooperative, alert, well developed, and in no acute distress. PSYCH: Has normal mood, affect and thought process.   Lungs: Normal breathing effort, no conversational dyspnea.  DIAGNOSTIC DATA  REVIEWED:  BMET    Component Value Date/Time   NA 143 12/20/2021 1553   K 4.8 12/20/2021 1553   CL 103 12/20/2021 1553   CO2 25 12/20/2021 1553   GLUCOSE 86 12/20/2021 1553   GLUCOSE 101 (H) 08/17/2021 1526   BUN 10 12/20/2021 1553   CREATININE 0.92 12/20/2021 1553   CREATININE 0.83 09/11/2011 1524   CALCIUM 10.2 12/20/2021 1553   GFRNONAA >60 08/18/2021 1755   GFRAA 69 01/15/2020 0948   Lab Results  Component Value Date   HGBA1C 5.3 03/14/2022   HGBA1C 5.3 07/11/2007   Lab Results  Component Value Date   INSULIN 7.0 03/14/2022   INSULIN 9.3 02/15/2021   Lab Results  Component Value Date   TSH 2.660 02/15/2021   CBC    Component Value Date/Time   WBC 7.3 08/18/2021 1755   RBC 4.40 08/18/2021 1755   HGB 13.5 08/18/2021 1755   HGB 14.7 02/15/2021 1025   HCT 41.2 08/18/2021 1755   HCT 43.8 02/15/2021 1025   PLT 201 08/18/2021 1755   PLT 260 02/15/2021 1025   MCV 93.6 08/18/2021 1755   MCV 91 02/15/2021 1025   MCH 30.7 08/18/2021 1755   MCHC 32.8 08/18/2021 1755   RDW 12.7 08/18/2021 1755   RDW 12.1 02/15/2021 1025   Iron Studies    Component Value Date/Time   IRON 111 07/19/2020 1004   TIBC  286 07/19/2020 1004   FERRITIN 63 07/19/2020 1004   IRONPCTSAT 39 07/19/2020 1004   Lipid Panel     Component Value Date/Time   CHOL 198 03/14/2022 1247   TRIG 90 03/14/2022 1247   HDL 60 03/14/2022 1247   CHOLHDL 3.4 07/06/2021 1129   CHOLHDL 3.7 12/29/2016 0627   VLDL 27 12/29/2016 0627   LDLCALC 122 (H) 03/14/2022 1247   Hepatic Function Panel     Component Value Date/Time   PROT 6.3 (L) 08/17/2021 1518   PROT 6.1 07/06/2021 1129   ALBUMIN 4.0 08/17/2021 1518   ALBUMIN 4.3 07/06/2021 1129   AST 18 08/17/2021 1518   ALT 18 08/17/2021 1518   ALKPHOS 64 08/17/2021 1518   BILITOT 0.5 08/17/2021 1518   BILITOT 0.4 07/06/2021 1129   BILIDIR 0.1 04/22/2014 0630   IBILI 0.3 04/22/2014 0630      Component Value Date/Time   TSH 2.660 02/15/2021 1025    Nutritional Lab Results  Component Value Date   VD25OH 66.8 12/20/2021   VD25OH 74.2 07/06/2021   VD25OH 49.4 02/15/2021     ASSESSMENT AND PLAN  1. Insulin resistance Insulin Resistance Elisa has had elevated fasting insulin readings. Goal is HgbA1c < 5.7, fasting insulin closer to 5.   She reports  denies polyphagia. Medication(s): None She is working on nutrition plan to decrease simple carbohydrates, increase lean proteins and exercise to promote weight loss, improve glycemic control and prevent progression to Type 2 diabetes.    Lab Results  Component Value Date   HGBA1C 5.3 03/14/2022   HGBA1C 5.2 07/06/2021   HGBA1C 5.1 02/15/2021   HGBA1C 5.1 12/29/2016   HGBA1C 5.1 07/19/2016   Lab Results  Component Value Date   INSULIN 7.0 03/14/2022   INSULIN 6.0 07/06/2021   INSULIN 9.3 02/15/2021    Plan Continue Working on nutrition plan to decrease simple carbohydrates, increase lean proteins and exercise to promote weight loss, improve glycemic control and prevent progression to Type 2 diabetes.   2. Other hyperlipidemia Hyperlipidemia LDL is not at goal. Medication(s): None Cardiovascular risk factors: dyslipidemia and obesity (BMI >= 30 kg/m2)  Lab Results  Component Value Date   CHOL 198 03/14/2022   HDL 60 03/14/2022   LDLCALC 122 (H) 03/14/2022   TRIG 90 03/14/2022   CHOLHDL 3.4 07/06/2021   CHOLHDL 3.7 12/29/2016   CHOLHDL 3.4 07/19/2016   Lab Results  Component Value Date   ALT 18 08/17/2021   AST 18 08/17/2021   ALKPHOS 64 08/17/2021   BILITOT 0.5 08/17/2021   The 10-year ASCVD risk score (Arnett DK, et al., 2019) is: 1.2%   Values used to calculate the score:     Age: 52 years     Sex: Female     Is Non-Hispanic African American: No     Diabetic: No     Tobacco smoker: No     Systolic Blood Pressure: 123456 mmHg     Is BP treated: Yes     HDL Cholesterol: 60 mg/dL     Total Cholesterol: 198 mg/dL  Plan: Continue nutrition plan to  decrease saturated fats and cholesterol and simple carbohydrates and exercise as able to promote weight loss and improve lipids/decrease CV risks.   3. Obesity (HCC)-start bmi 39.87  4. BMI 34.0-34.9,adult Current BMI 35.2   TREATMENT PLAN FOR OBESITY:  Recommended Dietary Goals  Prisma is currently in the action stage of change. As such, her goal is to continue weight management plan.  She has agreed to the Category 2 Plan.  Behavioral Intervention  We discussed the following Behavioral Modification Strategies today: increasing lean protein intake, increasing vegetables, increasing water intake, and work on meal planning and easy cooking plans.  Additional resources provided today: NA  Recommended Physical Activity Goals  Mayu has been advised to work up to 150 minutes of moderate intensity aerobic activity a week and strengthening exercises 2-3 times per week for cardiovascular health, weight loss maintenance and preservation of muscle mass.   She has agreed to increase physical activity in their day and reduce sedentary time (increase NEAT).    ASSOCIATED CONDITIONS ADDRESSED TODAY     Return in about 4 weeks (around 05/03/2022).Marland Kitchen She was informed of the importance of frequent follow up visits to maximize her success with intensive lifestyle modifications for her multiple health conditions.   ATTESTASTION STATEMENTS:  Reviewed by clinician on day of visit: allergies, medications, problem list, medical history, surgical history, family history, social history, and previous encounter notes.   I have personally spent 30 minutes total time today in preparation, patient care, nutritional counseling and documentation for this visit, including the following: review of clinical lab tests; review of medical tests/procedures/services.      Riki Gehring, PA-C

## 2022-04-10 ENCOUNTER — Other Ambulatory Visit: Payer: Self-pay | Admitting: Neurology

## 2022-04-11 DIAGNOSIS — F3181 Bipolar II disorder: Secondary | ICD-10-CM | POA: Diagnosis not present

## 2022-04-11 DIAGNOSIS — F900 Attention-deficit hyperactivity disorder, predominantly inattentive type: Secondary | ICD-10-CM | POA: Diagnosis not present

## 2022-04-11 DIAGNOSIS — F603 Borderline personality disorder: Secondary | ICD-10-CM | POA: Diagnosis not present

## 2022-04-12 DIAGNOSIS — M7061 Trochanteric bursitis, right hip: Secondary | ICD-10-CM | POA: Diagnosis not present

## 2022-04-12 DIAGNOSIS — M25551 Pain in right hip: Secondary | ICD-10-CM | POA: Diagnosis not present

## 2022-04-13 DIAGNOSIS — M25512 Pain in left shoulder: Secondary | ICD-10-CM | POA: Diagnosis not present

## 2022-04-20 ENCOUNTER — Other Ambulatory Visit: Payer: Self-pay | Admitting: Adult Health

## 2022-04-20 DIAGNOSIS — M79671 Pain in right foot: Secondary | ICD-10-CM | POA: Diagnosis not present

## 2022-04-20 DIAGNOSIS — M25561 Pain in right knee: Secondary | ICD-10-CM | POA: Diagnosis not present

## 2022-04-20 DIAGNOSIS — M25551 Pain in right hip: Secondary | ICD-10-CM | POA: Diagnosis not present

## 2022-04-20 DIAGNOSIS — M25571 Pain in right ankle and joints of right foot: Secondary | ICD-10-CM | POA: Diagnosis not present

## 2022-04-21 ENCOUNTER — Encounter (INDEPENDENT_AMBULATORY_CARE_PROVIDER_SITE_OTHER): Payer: Self-pay | Admitting: Family Medicine

## 2022-04-21 DIAGNOSIS — M25512 Pain in left shoulder: Secondary | ICD-10-CM | POA: Diagnosis not present

## 2022-04-21 DIAGNOSIS — M25561 Pain in right knee: Secondary | ICD-10-CM | POA: Diagnosis not present

## 2022-04-22 DIAGNOSIS — M25561 Pain in right knee: Secondary | ICD-10-CM | POA: Diagnosis not present

## 2022-04-24 NOTE — Telephone Encounter (Signed)
Appt has been cancelled.  

## 2022-04-25 ENCOUNTER — Ambulatory Visit (INDEPENDENT_AMBULATORY_CARE_PROVIDER_SITE_OTHER): Payer: PPO | Admitting: Family Medicine

## 2022-05-03 DIAGNOSIS — S83511A Sprain of anterior cruciate ligament of right knee, initial encounter: Secondary | ICD-10-CM | POA: Diagnosis not present

## 2022-05-09 DIAGNOSIS — M25512 Pain in left shoulder: Secondary | ICD-10-CM | POA: Diagnosis not present

## 2022-05-11 DIAGNOSIS — M25551 Pain in right hip: Secondary | ICD-10-CM | POA: Diagnosis not present

## 2022-05-11 DIAGNOSIS — M25561 Pain in right knee: Secondary | ICD-10-CM | POA: Diagnosis not present

## 2022-05-14 ENCOUNTER — Other Ambulatory Visit: Payer: Self-pay | Admitting: Adult Health

## 2022-05-15 ENCOUNTER — Other Ambulatory Visit: Payer: Self-pay | Admitting: Adult Health

## 2022-05-15 ENCOUNTER — Encounter: Payer: Self-pay | Admitting: *Deleted

## 2022-05-15 DIAGNOSIS — M545 Low back pain, unspecified: Secondary | ICD-10-CM | POA: Diagnosis not present

## 2022-05-15 DIAGNOSIS — M5416 Radiculopathy, lumbar region: Secondary | ICD-10-CM | POA: Diagnosis not present

## 2022-05-16 ENCOUNTER — Ambulatory Visit (INDEPENDENT_AMBULATORY_CARE_PROVIDER_SITE_OTHER): Payer: PPO | Admitting: Physician Assistant

## 2022-05-25 DIAGNOSIS — M5451 Vertebrogenic low back pain: Secondary | ICD-10-CM | POA: Diagnosis not present

## 2022-05-26 ENCOUNTER — Telehealth (INDEPENDENT_AMBULATORY_CARE_PROVIDER_SITE_OTHER): Payer: PPO | Admitting: Adult Health

## 2022-05-26 DIAGNOSIS — G43109 Migraine with aura, not intractable, without status migrainosus: Secondary | ICD-10-CM | POA: Diagnosis not present

## 2022-05-26 DIAGNOSIS — M25551 Pain in right hip: Secondary | ICD-10-CM | POA: Diagnosis not present

## 2022-05-26 DIAGNOSIS — M25561 Pain in right knee: Secondary | ICD-10-CM | POA: Diagnosis not present
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
## 2022-05-30 DIAGNOSIS — M25551 Pain in right hip: Secondary | ICD-10-CM | POA: Diagnosis not present

## 2022-05-30 DIAGNOSIS — M25561 Pain in right knee: Secondary | ICD-10-CM | POA: Diagnosis not present

## 2022-05-31 DIAGNOSIS — S83511D Sprain of anterior cruciate ligament of right knee, subsequent encounter: Secondary | ICD-10-CM | POA: Diagnosis not present

## 2022-06-19 DIAGNOSIS — M659 Synovitis and tenosynovitis, unspecified: Secondary | ICD-10-CM | POA: Diagnosis not present

## 2022-06-19 DIAGNOSIS — G8918 Other acute postprocedural pain: Secondary | ICD-10-CM | POA: Diagnosis not present

## 2022-06-19 DIAGNOSIS — S83241A Other tear of medial meniscus, current injury, right knee, initial encounter: Secondary | ICD-10-CM | POA: Diagnosis not present

## 2022-06-19 DIAGNOSIS — W19XXXA Unspecified fall, initial encounter: Secondary | ICD-10-CM | POA: Diagnosis not present

## 2022-06-19 DIAGNOSIS — X58XXXA Exposure to other specified factors, initial encounter: Secondary | ICD-10-CM | POA: Diagnosis not present

## 2022-06-19 DIAGNOSIS — S83511A Sprain of anterior cruciate ligament of right knee, initial encounter: Secondary | ICD-10-CM | POA: Diagnosis not present

## 2022-06-19 HISTORY — PX: ARTHROSCOPY WITH ANTERIOR CRUCIATE LIGAMENT (ACL) REPAIR WITH ANTERIOR TIBILIAS GRAFT: SHX6503

## 2022-06-30 DIAGNOSIS — M25561 Pain in right knee: Secondary | ICD-10-CM | POA: Diagnosis not present

## 2022-07-04 DIAGNOSIS — M25561 Pain in right knee: Secondary | ICD-10-CM | POA: Diagnosis not present

## 2022-07-06 ENCOUNTER — Other Ambulatory Visit: Payer: Self-pay | Admitting: Neurology

## 2022-07-06 DIAGNOSIS — M25561 Pain in right knee: Secondary | ICD-10-CM | POA: Diagnosis not present

## 2022-07-11 DIAGNOSIS — F603 Borderline personality disorder: Secondary | ICD-10-CM | POA: Diagnosis not present

## 2022-07-11 DIAGNOSIS — F3181 Bipolar II disorder: Secondary | ICD-10-CM | POA: Diagnosis not present

## 2022-07-11 DIAGNOSIS — M25561 Pain in right knee: Secondary | ICD-10-CM | POA: Diagnosis not present

## 2022-07-11 DIAGNOSIS — F900 Attention-deficit hyperactivity disorder, predominantly inattentive type: Secondary | ICD-10-CM | POA: Diagnosis not present

## 2022-07-13 ENCOUNTER — Encounter (INDEPENDENT_AMBULATORY_CARE_PROVIDER_SITE_OTHER): Payer: Self-pay | Admitting: Family Medicine

## 2022-07-13 ENCOUNTER — Telehealth (INDEPENDENT_AMBULATORY_CARE_PROVIDER_SITE_OTHER): Payer: PPO | Admitting: Family Medicine

## 2022-07-13 DIAGNOSIS — Z6835 Body mass index (BMI) 35.0-35.9, adult: Secondary | ICD-10-CM

## 2022-07-13 DIAGNOSIS — M25561 Pain in right knee: Secondary | ICD-10-CM | POA: Diagnosis not present

## 2022-07-13 DIAGNOSIS — E88819 Insulin resistance, unspecified: Secondary | ICD-10-CM | POA: Diagnosis not present

## 2022-07-13 NOTE — Progress Notes (Addendum)
  TeleHealth Visit:  This visit was completed with telemedicine (audio/video) technology. [name] has verbally consented to this TeleHealth visit. The patient is located at home, the provider is located at home. The participants in this visit include the listed provider and patient. The visit was conducted today via MyChart video.  OBESITY [name] is here to discuss her progress with her obesity treatment plan along with follow-up of her obesity related diagnoses.   Today's visit was # 25 Starting weight: 270 lbs Starting date: 02/15/21 Weight at last in office visit: 238 lbs on 04/05/22 Total weight loss: 32 lbs at last in office visit on 04/05/22. Today's reported weight (07/13/22):  241 lbs  Nutrition Plan: the Category 2 plan   Current exercise:  PT  Interim History:  She is still on crutches post ACL repair on May 20. Doing PT currently. She is allergic to opioids so pain control has been an issue. She is trying to follow cat 2 plan but is unable to stand to cook.  She doesn't skip meals. However, she does not get in protein at all meals.  She is ordering groceries and picking them up. She has a garden which provides fresh vegetables. Often has [employer] protein shake for breakfast. Sometimes has Aussie Bites.   Assessment/Plan:  1. Insulin Resistance Last fasting insulin was 7.0- mild IR. A1c was 5.3. Polyphagia:No Medication(s): None Lab Results  Component Value Date   HGBA1C 5.3 03/14/2022   HGBA1C 5.2 07/06/2021   HGBA1C 5.1 02/15/2021   HGBA1C 5.1 12/29/2016   HGBA1C 5.1 07/19/2016   Lab Results  Component Value Date   INSULIN 7.0 03/14/2022   INSULIN 6.0 07/06/2021   INSULIN 9.3 02/15/2021    Plan: Continue to work on increasing protein in diet and increasing activity level as directed by physical therapy.   2. Morbid Obesity: Current BMI 35  [name] is currently in the action stage of change. As such, her goal is to continue with weight loss efforts.   She has agreed to the Category 2 plan.  1.  Work on getting at least 80 g of protein per day. 2.  Discussed options for meals that require little standing or meal prep-cooking several chicken breast at once, cooking a pork loin, buying a rotisserie chicken. 3.  Have protein at every meal.  Exercise goals: Physical therapy  Behavioral modification strategies: increasing lean protein intake and meal planning .  [name] has agreed to follow-up with our clinic in 4 weeks.  No orders of the defined types were placed in this encounter.   There are no discontinued medications.   No orders of the defined types were placed in this encounter.     Objective:   VITALS: Per patient if applicable, see vitals. GENERAL: Alert and in no acute distress. CARDIOPULMONARY: No increased WOB. Speaking in clear sentences.  PSYCH: Pleasant and cooperative. Speech normal rate and rhythm. Affect is appropriate. Insight and judgement are appropriate. Attention is focused, linear, and appropriate.  NEURO: Oriented as arrived to appointment on time with no prompting.   Attestation Statements:   Reviewed by clinician on day of visit: allergies, medications, problem list, medical history, surgical history, family history, social history, and previous encounter notes.   This was prepared with the assistance of Engineer, civil (consulting).  Occasional wrong-word or sound-a-like substitutions may have occurred due to the inherent limitations of voice recognition software.

## 2022-07-17 ENCOUNTER — Ambulatory Visit (INDEPENDENT_AMBULATORY_CARE_PROVIDER_SITE_OTHER): Payer: PPO | Admitting: Physician Assistant

## 2022-07-18 DIAGNOSIS — M25561 Pain in right knee: Secondary | ICD-10-CM | POA: Diagnosis not present

## 2022-07-20 DIAGNOSIS — M25561 Pain in right knee: Secondary | ICD-10-CM | POA: Diagnosis not present

## 2022-07-25 DIAGNOSIS — M25561 Pain in right knee: Secondary | ICD-10-CM | POA: Diagnosis not present

## 2022-07-27 DIAGNOSIS — M25561 Pain in right knee: Secondary | ICD-10-CM | POA: Diagnosis not present

## 2022-07-31 ENCOUNTER — Encounter: Payer: Self-pay | Admitting: Adult Health

## 2022-08-01 ENCOUNTER — Encounter: Payer: Self-pay | Admitting: Adult Health

## 2022-08-01 ENCOUNTER — Ambulatory Visit: Payer: PPO | Admitting: Adult Health

## 2022-08-01 DIAGNOSIS — M25561 Pain in right knee: Secondary | ICD-10-CM | POA: Diagnosis not present

## 2022-08-04 DIAGNOSIS — M25561 Pain in right knee: Secondary | ICD-10-CM | POA: Diagnosis not present

## 2022-08-08 DIAGNOSIS — M25561 Pain in right knee: Secondary | ICD-10-CM | POA: Diagnosis not present

## 2022-08-10 DIAGNOSIS — M25561 Pain in right knee: Secondary | ICD-10-CM | POA: Diagnosis not present

## 2022-08-15 ENCOUNTER — Encounter (INDEPENDENT_AMBULATORY_CARE_PROVIDER_SITE_OTHER): Payer: Self-pay | Admitting: Family Medicine

## 2022-08-15 ENCOUNTER — Ambulatory Visit (INDEPENDENT_AMBULATORY_CARE_PROVIDER_SITE_OTHER): Payer: PPO | Admitting: Family Medicine

## 2022-08-15 VITALS — BP 114/72 | HR 65 | Temp 98.0°F | Ht 69.0 in | Wt 245.0 lb

## 2022-08-15 DIAGNOSIS — E669 Obesity, unspecified: Secondary | ICD-10-CM

## 2022-08-15 DIAGNOSIS — I10 Essential (primary) hypertension: Secondary | ICD-10-CM

## 2022-08-15 DIAGNOSIS — E559 Vitamin D deficiency, unspecified: Secondary | ICD-10-CM | POA: Diagnosis not present

## 2022-08-15 DIAGNOSIS — E88819 Insulin resistance, unspecified: Secondary | ICD-10-CM

## 2022-08-15 DIAGNOSIS — Z6836 Body mass index (BMI) 36.0-36.9, adult: Secondary | ICD-10-CM

## 2022-08-15 DIAGNOSIS — M25561 Pain in right knee: Secondary | ICD-10-CM | POA: Diagnosis not present

## 2022-08-15 DIAGNOSIS — Z6835 Body mass index (BMI) 35.0-35.9, adult: Secondary | ICD-10-CM

## 2022-08-15 NOTE — Progress Notes (Signed)
[name], D.O.  ABFM, ABOM Specializing in Clinical Bariatric Medicine  Office located at: [address] [place], [place]  [address]     Assessment and Plan:   Come in 30 minutes prior for fasting repeat IC next OV  Insulin resistance Assessment: Goal is HgbA1c < 5.7, fasting insulin closer to 5.  Medication: none, condition is diet/exercise controlled. Hunger and cravings are better controlled when following her prudent nutritional plan.   Lab Results  Component Value Date   HGBA1C 5.3 03/14/2022   Lab Results  Component Value Date   INSULIN 7.0 03/14/2022   INSULIN 6.0 07/06/2021   INSULIN 9.3 02/15/2021    Plan: She will continue to focus on protein-rich, low simple carbohydrate foods.  Pt encouraged to continually advance exercise and cardiovascular fitness as directed by PT. Will continue to monitor condition alongside PCP as it relates to their weight loss journey    Essential hypertension Assessment: Her blood pressure is stable. No issues in this regard today. She's being treated with Toprol-XL 100 mg daily - tolerating medicine well, denies any adverse effects.   Last 3 blood pressure readings in our office are as follows: BP Readings from Last 3 Encounters:  08/15/22 114/72  04/05/22 105/68  03/14/22 107/65   Plan: Continue with antihypertensive medication as recommended by specialist. Continue with Prudent nutritional plan and low sodium diet, advance exercise as tolerated. Ambulatory blood pressure monitoring encouraged.  Reminded patient that if they ever feel poorly in any way, to check their blood pressure and pulse as well. We will continue to monitor symptoms as they relate to the her weight loss journey.    Vitamin D deficiency Assessment: Condition is being treated with OTC Cholecalciferol 5,000 units daily- tolerating supplement well, denies any side effects.    Lab Results  Component Value Date   VD25OH 66.8 12/20/2021   VD25OH 74.2  07/06/2021   VD25OH 49.4 02/15/2021   Plan:  Maintain with OTC Vitamin D at current dose and continue with meal plan and their weight loss efforts to further improve this condition. Will recheck levels as deemed appropriate to prevent over supplementation.   TREATMENT PLAN FOR OBESITY: BMI 35.0-35.9,adult - current BMI 36.16 Obesity (HCC)-start bmi 39.87 Assessment: [name] is here to discuss her progress with her obesity treatment plan along with follow-up of her obesity related diagnoses. See Medical Weight Management Flowsheet for complete bioelectrical impedance results.  Condition is not optimized. Biometric data collected today, was reviewed with patient.   Since last office visit on 04/05/22 patient's  Muscle mass has decreased by 3.8 lb. Fat mass has increased by 11.4 lb. Total body water has increased by 3.2 lb.  Counseling done on how various foods will affect these numbers and how to maximize success  Total lbs lost to date: 25 lbs Total weight loss percentage to date: 9.26%  Plan:  I recommended pt to purchase canned tuna and/or canned chicken  as these are relatively economical meats. I also reminded pt that losing weight can likely improve her knee pain. Pt agrees to continue the Category 2 meal plan with breakfast and lunch options  Behavioral Intervention Additional resources provided today: category 2 meal plan information, breakfast options, and lunch options Evidence-based interventions for health behavior change were utilized today including the discussion of self monitoring techniques, problem-solving barriers and SMART goal setting techniques.   Regarding patient's less desirable eating habits and patterns, we employed the technique of small changes.  Pt will specifically work on: adhering to meal plan more closely for next visit.    Recommended Physical Activity Goals  [name] has been advised to slowly work up to 150 minutes of moderate intensity aerobic  activity a week and strengthening exercises 2-3 times per week for cardiovascular health, weight loss maintenance and preservation of muscle mass.   She has agreed to Continue current level of physical activity   FOLLOW UP: Return in about 4 weeks (around 09/12/2022). She was informed of the importance of frequent follow up visits to maximize her success with intensive lifestyle modifications for her multiple health conditions.  Subjective:   Chief complaint: Obesity [name] is here to discuss her progress with her obesity treatment plan. She is on the Category 2 Plan and states she is following her eating plan approximately 65% of the time. She states she is not exercising.   Interval History:  [name] is here for a follow up office visit. Since last OV, [name] endorses that her eating habits have been "terrible lately". She has not been able to go to the grocery store - pt still recovering from ACL repair on May 20th - and reports that financially it has been difficult. Goes to physical therapy 2x a wk. January 2023 was her last metabolism test, thus we may obtain a Repeat IC in the near future.   Review of Systems:  Pertinent positives were addressed with patient today.  Reviewed by clinician on day of visit: allergies, medications, problem list, medical history, surgical history, family history, social history, and previous encounter notes.  Weight Summary and Biometrics   Weight Lost Since Last Visit: 0lb  Weight Gained Since Last Visit: 7lb    Vitals Temp: 98 F (36.7 C) BP: 114/72 Pulse Rate: 65 SpO2: 98 %   Anthropometric Measurements Height: 5\' 9"  (1.753 m) Weight: 245 lb (111.1 kg) BMI (Calculated): 36.16 Weight at Last Visit: 238lb Weight Lost Since Last Visit: 0lb Weight Gained Since Last Visit: 7lb Starting Weight: 270lb Total Weight Loss (lbs): 25 lb (11.3 kg)   Body Composition  Body Fat %: 48.7 % Fat Mass (lbs): 119.8 lbs Muscle Mass (lbs): 119.6  lbs Total Body Water (lbs): 93.8 lbs Visceral Fat Rating : 13   Other Clinical Data Fasting: no Labs: no Today's Visit #: 25   Objective:   PHYSICAL EXAM: Blood pressure 114/72, pulse 65, temperature 98 F (36.7 C), height 5\' 9"  (1.753 m), weight 245 lb (111.1 kg), SpO2 98%. Body mass index is 36.18 kg/m.  General: Well Developed, well nourished, and in no acute distress.  HEENT: Normocephalic, atraumatic Skin: Warm and dry, cap RF less 2 sec, good turgor Chest:  Normal excursion, shape, no gross abn Respiratory: speaking in full sentences, no conversational dyspnea NeuroM-Sk: Ambulates w/o assistance, moves * 4 Psych: A and O *3, insight good, mood-full  DIAGNOSTIC DATA REVIEWED:  BMET    Component Value Date/Time   NA 143 12/20/2021 1553   K 4.8 12/20/2021 1553   CL 103 12/20/2021 1553   CO2 25 12/20/2021 1553   GLUCOSE 86 12/20/2021 1553   GLUCOSE 101 (H) 08/17/2021 1526   BUN 10 12/20/2021 1553   CREATININE 0.92 12/20/2021 1553   CREATININE 0.83 09/11/2011 1524   CALCIUM 10.2 12/20/2021 1553   GFRNONAA >60 08/18/2021 1755   GFRAA 69 01/15/2020 0948   Lab Results  Component Value Date   HGBA1C 5.3 03/14/2022   HGBA1C 5.3 07/11/2007   Lab Results  Component  Value Date   INSULIN 7.0 03/14/2022   INSULIN 9.3 02/15/2021   Lab Results  Component Value Date   TSH 2.660 02/15/2021   CBC    Component Value Date/Time   WBC 7.3 08/18/2021 1755   RBC 4.40 08/18/2021 1755   HGB 13.5 08/18/2021 1755   HGB 14.7 02/15/2021 1025   HCT 41.2 08/18/2021 1755   HCT 43.8 02/15/2021 1025   PLT 201 08/18/2021 1755   PLT 260 02/15/2021 1025   MCV 93.6 08/18/2021 1755   MCV 91 02/15/2021 1025   MCH 30.7 08/18/2021 1755   MCHC 32.8 08/18/2021 1755   RDW 12.7 08/18/2021 1755   RDW 12.1 02/15/2021 1025   Iron Studies    Component Value Date/Time   IRON 111 07/19/2020 1004   TIBC 286 07/19/2020 1004   FERRITIN 63 07/19/2020 1004   IRONPCTSAT 39 07/19/2020 1004    Lipid Panel     Component Value Date/Time   CHOL 198 03/14/2022 1247   TRIG 90 03/14/2022 1247   HDL 60 03/14/2022 1247   CHOLHDL 3.4 07/06/2021 1129   CHOLHDL 3.7 12/29/2016 0627   VLDL 27 12/29/2016 0627   LDLCALC 122 (H) 03/14/2022 1247   Hepatic Function Panel     Component Value Date/Time   PROT 6.3 (L) 08/17/2021 1518   PROT 6.1 07/06/2021 1129   ALBUMIN 4.0 08/17/2021 1518   ALBUMIN 4.3 07/06/2021 1129   AST 18 08/17/2021 1518   ALT 18 08/17/2021 1518   ALKPHOS 64 08/17/2021 1518   BILITOT 0.5 08/17/2021 1518   BILITOT 0.4 07/06/2021 1129   BILIDIR 0.1 04/22/2014 0630   IBILI 0.3 04/22/2014 0630      Component Value Date/Time   TSH 2.660 02/15/2021 1025   Nutritional Lab Results  Component Value Date   VD25OH 66.8 12/20/2021   VD25OH 74.2 07/06/2021   VD25OH 49.4 02/15/2021    Attestations:   Patient was in the office today and time spent on visit including pre-visit chart review and post-visit care/coordination of care and electronic medical record documentation was 40 minutes. 50% of the time was in face to face counseling of this patient's medical condition(s) and providing education on treatment options to include the first-line treatment of diet and lifestyle modification.   I, Special Randolm Idol , acting as a Stage manager for [employer], DO., have compiled all relevant documentation for today's office visit on behalf of Thomasene Lot, DO, while in the presence of [employer], DO.  I have reviewed the above documentation for accuracy and completeness, and I agree with the above. [name], D.O.  The 21st Century Cures Act was signed into law in 2016 which includes the topic of electronic health records.  This provides immediate access to information in MyChart.  This includes consultation notes, operative notes, office notes, lab results and pathology reports.  If you have any questions about what you read please let us know at your next  visit so we can discuss your concerns and take corrective action if need be.  We are right here with you.

## 2022-08-17 DIAGNOSIS — M25561 Pain in right knee: Secondary | ICD-10-CM | POA: Diagnosis not present

## 2022-08-21 ENCOUNTER — Ambulatory Visit: Payer: PPO | Admitting: Adult Health

## 2022-08-22 DIAGNOSIS — M25561 Pain in right knee: Secondary | ICD-10-CM | POA: Diagnosis not present

## 2022-08-24 DIAGNOSIS — M25561 Pain in right knee: Secondary | ICD-10-CM | POA: Diagnosis not present

## 2022-08-29 DIAGNOSIS — M25561 Pain in right knee: Secondary | ICD-10-CM | POA: Diagnosis not present

## 2022-08-31 DIAGNOSIS — M25561 Pain in right knee: Secondary | ICD-10-CM | POA: Diagnosis not present

## 2022-09-05 DIAGNOSIS — M25561 Pain in right knee: Secondary | ICD-10-CM | POA: Diagnosis not present

## 2022-09-12 DIAGNOSIS — M25561 Pain in right knee: Secondary | ICD-10-CM | POA: Diagnosis not present

## 2022-09-12 DIAGNOSIS — S83511D Sprain of anterior cruciate ligament of right knee, subsequent encounter: Secondary | ICD-10-CM | POA: Diagnosis not present

## 2022-09-14 ENCOUNTER — Ambulatory Visit (INDEPENDENT_AMBULATORY_CARE_PROVIDER_SITE_OTHER): Payer: PPO | Admitting: Physician Assistant

## 2022-09-14 DIAGNOSIS — M25561 Pain in right knee: Secondary | ICD-10-CM | POA: Diagnosis not present

## 2022-09-19 DIAGNOSIS — M25561 Pain in right knee: Secondary | ICD-10-CM | POA: Diagnosis not present

## 2022-09-20 NOTE — Progress Notes (Signed)
.[facility]  Office: [phone]  /  Fax: [phone]  WEIGHT SUMMARY AND BIOMETRICS  Vitals Temp: 98.6 F (37 C) BP: 119/79 Pulse Rate: 69 SpO2: 99 %   Anthropometric Measurements Height: 5\' 9"  (1.753 m) Weight: 244 lb (110.7 kg) BMI (Calculated): 36.02 Weight at Last Visit: 245lb Weight Lost Since Last Visit: 1lb Weight Gained Since Last Visit: 0lb Starting Weight: 270lb Total Weight Loss (lbs): 26 lb (11.8 kg)   Body Composition  Body Fat %: 47.8 % Fat Mass (lbs): 117 lbs Muscle Mass (lbs): 121.2 lbs Total Body Water (lbs): 90.4 lbs Visceral Fat Rating : 13   Other Clinical Data Fasting: Yes Labs: No Today's Visit #: 26     HPI  Chief Complaint: OBESITY  Gabbi is here to discuss her progress with her obesity treatment plan. She is on the the Category 2 Plan and states she is following her eating plan approximately 50 % of the time. She states she is exercising Physical therapy 30 minutes 5-6 times per week.   Interval History:  Since last office visit she Down 1 lb She is getting back on track with her nutrition plan after having ACL knee surgery . She has been doing PT regularly. She has been able to get back to grocery shopping/ supplements with protein shakes.  Hunger/appetite-fair to moderate control when eating on plan Cravings- not excessive Stress- increased with recent surgery/ out of work, so increased financial stress/ Going back to work in Sept  Sleep- better as more recovered from surgery Exercise-PT 2 x weekly and doing HEP Hydration-adequate  Fasting labs were obtained today.  She was informed we would discuss her lab results at her next visit unless there is a critical issue that needs to be addressed sooner. She agreed to keep her next visit at the agreed upon time to discuss these results.    Pharmacotherapy: None for weight loss  TREATMENT PLAN FOR OBESITY:  Recommended Dietary Goals  Ragine is currently in the action stage of  change. As such, her goal is to continue weight management plan. She has agreed to the Category 2 Plan.  Behavioral Intervention  We discussed the following Behavioral Modification Strategies today: increasing lean protein intake, decreasing simple carbohydrates , increasing vegetables, increasing lower glycemic fruits, increasing fiber rich foods, increasing water intake, work on meal planning and preparation, keeping healthy foods at home, emotional eating strategies and understanding the difference between hunger signals and cravings, work on managing stress, creating time for self-care and relaxation measures, continue to practice mindfulness when eating, and planning for success.  Additional resources provided today: NA  Recommended Physical Activity Goals  Renota has been advised to work up to 150 minutes of moderate intensity aerobic activity a week and strengthening exercises 2-3 times per week for cardiovascular health, weight loss maintenance and preservation of muscle mass.   She has agreed to Continue current level of physical activity  and Think about ways to increase daily physical activity and overcoming barriers to exercise   Pharmacotherapy We discussed various medication options to help Kentasia with her weight loss efforts and we both agreed to continue to work on nutritional and behavioral strategies to promote weight loss.  .    Follow up in 4 weeks. Marland Kitchen She was informed of the importance of frequent follow up visits to maximize her success with intensive lifestyle modifications for her multiple health conditions.  PHYSICAL EXAM:  Blood pressure 119/79, pulse 69, temperature 98.6 F (37 C), height 5\' 9"  (  1.753 m), weight 244 lb (110.7 kg), SpO2 99%. Body mass index is 36.03 kg/m.  General: She is overweight, cooperative, alert, well developed, and in no acute distress. Wearing brace on right knee. Ambulates with slightly antalgic gait without assistive device.  PSYCH:  Has normal mood, affect and thought process.   Cardiovascular: HR 69, BP 119/79 Lungs: Normal breathing effort, no conversational dyspnea.  DIAGNOSTIC DATA REVIEWED:  BMET    Component Value Date/Time   NA 143 12/20/2021 1553   K 4.8 12/20/2021 1553   CL 103 12/20/2021 1553   CO2 25 12/20/2021 1553   GLUCOSE 86 12/20/2021 1553   GLUCOSE 101 (H) 08/17/2021 1526   BUN 10 12/20/2021 1553   CREATININE 0.92 12/20/2021 1553   CREATININE 0.83 09/11/2011 1524   CALCIUM 10.2 12/20/2021 1553   GFRNONAA >60 08/18/2021 1755   GFRAA 69 01/15/2020 0948   Lab Results  Component Value Date   HGBA1C 5.3 03/14/2022   HGBA1C 5.3 07/11/2007   Lab Results  Component Value Date   INSULIN 7.0 03/14/2022   INSULIN 9.3 02/15/2021   Lab Results  Component Value Date   TSH 2.660 02/15/2021   CBC    Component Value Date/Time   WBC 7.3 08/18/2021 1755   RBC 4.40 08/18/2021 1755   HGB 13.5 08/18/2021 1755   HGB 14.7 02/15/2021 1025   HCT 41.2 08/18/2021 1755   HCT 43.8 02/15/2021 1025   PLT 201 08/18/2021 1755   PLT 260 02/15/2021 1025   MCV 93.6 08/18/2021 1755   MCV 91 02/15/2021 1025   MCH 30.7 08/18/2021 1755   MCHC 32.8 08/18/2021 1755   RDW 12.7 08/18/2021 1755   RDW 12.1 02/15/2021 1025   Iron Studies    Component Value Date/Time   IRON 111 07/19/2020 1004   TIBC 286 07/19/2020 1004   FERRITIN 63 07/19/2020 1004   IRONPCTSAT 39 07/19/2020 1004   Lipid Panel     Component Value Date/Time   CHOL 198 03/14/2022 1247   TRIG 90 03/14/2022 1247   HDL 60 03/14/2022 1247   CHOLHDL 3.4 07/06/2021 1129   CHOLHDL 3.7 12/29/2016 0627   VLDL 27 12/29/2016 0627   LDLCALC 122 (H) 03/14/2022 1247   Hepatic Function Panel     Component Value Date/Time   PROT 6.3 (L) 08/17/2021 1518   PROT 6.1 07/06/2021 1129   ALBUMIN 4.0 08/17/2021 1518   ALBUMIN 4.3 07/06/2021 1129   AST 18 08/17/2021 1518   ALT 18 08/17/2021 1518   ALKPHOS 64 08/17/2021 1518   BILITOT 0.5 08/17/2021 1518    BILITOT 0.4 07/06/2021 1129   BILIDIR 0.1 04/22/2014 0630   IBILI 0.3 04/22/2014 0630      Component Value Date/Time   TSH 2.660 02/15/2021 1025   Nutritional Lab Results  Component Value Date   VD25OH 66.8 12/20/2021   VD25OH 74.2 07/06/2021   VD25OH 49.4 02/15/2021    ASSOCIATED CONDITIONS ADDRESSED TODAY  ASSESSMENT AND PLAN  Problem List Items Addressed This Visit     Morbid obesity (HCC)   Essential hypertension   Insulin resistance - Primary   Other hyperlipidemia   Vitamin D deficiency   Insulin Resistance Last fasting insulin was 7.0- not at goal. A1c was 5.3. Polyphagia:No Medication(s): None Continue working on nutrition plan to decrease simple carbohydrates, increase lean proteins and exercise to promote weight loss, improve glycemic control and prevent progression to Type 2 diabetes.   Lab Results  Component Value Date   HGBA1C  5.3 03/14/2022   HGBA1C 5.2 07/06/2021   HGBA1C 5.1 02/15/2021   HGBA1C 5.1 12/29/2016   HGBA1C 5.1 07/19/2016   Lab Results  Component Value Date   INSULIN 7.0 03/14/2022   INSULIN 6.0 07/06/2021   INSULIN 9.3 02/15/2021    Plan:  Continue working on nutrition plan to decrease simple carbohydrates, increase lean proteins and exercise to promote weight loss, improve glycemic control and prevent progression to Type 2 diabetes.  Recheck fasting labs today.   Hypertension Hypertension well controlled, asymptomatic, and no significant medication side effects noted.  Medication(s): Metoprolol 100 mg daily   Verapamil SR 120 mg daily Renal function is normal.   BP Readings from Last 3 Encounters:  09/21/22 119/79  08/15/22 114/72  04/05/22 105/68   Lab Results  Component Value Date   CREATININE 0.92 12/20/2021   CREATININE 0.85 08/18/2021   CREATININE 1.00 08/17/2021   No results found for: "GFR"  Plan: Continue all antihypertensives at current dosages. Continue to work on nutrition plan to promote weight loss  and improve BP control.  Recheck labs today.   Vitamin D Deficiency Vitamin D is at goal of 50.  Most recent vitamin D level was 66.8. She is on OTC vitamin D3 5000 IU daily. Lab Results  Component Value Date   VD25OH 66.8 12/20/2021   VD25OH 74.2 07/06/2021   VD25OH 49.4 02/15/2021    Plan: Continue OTC vitamin D3 5000 IU daily Low vitamin D levels can be associated with adiposity and may result in leptin resistance and weight gain. Also associated with fatigue. Currently on vitamin D supplementation without any adverse effects.  Recheck vitamin D level today.   Hyperlipidemia LDL is not at goal. Medication(s): None Cardiovascular risk factors: dyslipidemia, hypertension, obesity (BMI >= 30 kg/m2), and sedentary lifestyle  Lab Results  Component Value Date   CHOL 198 03/14/2022   HDL 60 03/14/2022   LDLCALC 122 (H) 03/14/2022   TRIG 90 03/14/2022   CHOLHDL 3.4 07/06/2021   CHOLHDL 3.7 12/29/2016   CHOLHDL 3.4 07/19/2016   Lab Results  Component Value Date   ALT 18 08/17/2021   AST 18 08/17/2021   ALKPHOS 64 08/17/2021   BILITOT 0.5 08/17/2021   The 10-year ASCVD risk score (Arnett DK, et al., 2019) is: 1.6%   Values used to calculate the score:     Age: 66 years     Sex: Female     Is Non-Hispanic African American: No     Diabetic: No     Tobacco smoker: No     Systolic Blood Pressure: 119 mmHg     Is BP treated: Yes     HDL Cholesterol: 60 mg/dL     Total Cholesterol: 198 mg/dL  Plan:Statin not indicated at this time.  Continue to work on nutrition plan -decreasing simple carbohydrates, increasing lean proteins, decreasing saturated fats and cholesterol , avoiding trans fats and exercise as able to promote weight loss, improve lipids and decrease cardiovascular risks. Recheck fasting labs today.   Fatigue:  Endorses some fatigue, but limited activity following recent ACL surgery.  On vitamin D supplement.  Plan: Recheck CBC, TSH, B 12 levels as well.    ATTESTASTION STATEMENTS:  Reviewed by clinician on day of visit: allergies, medications, problem list, medical history, surgical history, family history, social history, and previous encounter notes.   I have personally spent 30 minutes total time today in preparation, patient care, nutritional counseling and documentation for this visit, including the following:  review of clinical lab tests; review of medical tests/procedures/services.      Jehan Bonano, PA-C

## 2022-09-21 ENCOUNTER — Encounter (INDEPENDENT_AMBULATORY_CARE_PROVIDER_SITE_OTHER): Payer: Self-pay | Admitting: Physician Assistant

## 2022-09-21 ENCOUNTER — Ambulatory Visit (INDEPENDENT_AMBULATORY_CARE_PROVIDER_SITE_OTHER): Payer: PPO | Admitting: Physician Assistant

## 2022-09-21 VITALS — BP 119/79 | HR 69 | Temp 98.6°F | Ht 69.0 in | Wt 244.0 lb

## 2022-09-21 DIAGNOSIS — I10 Essential (primary) hypertension: Secondary | ICD-10-CM | POA: Diagnosis not present

## 2022-09-21 DIAGNOSIS — E559 Vitamin D deficiency, unspecified: Secondary | ICD-10-CM

## 2022-09-21 DIAGNOSIS — Z6836 Body mass index (BMI) 36.0-36.9, adult: Secondary | ICD-10-CM

## 2022-09-21 DIAGNOSIS — E7849 Other hyperlipidemia: Secondary | ICD-10-CM

## 2022-09-21 DIAGNOSIS — M25561 Pain in right knee: Secondary | ICD-10-CM | POA: Diagnosis not present

## 2022-09-21 DIAGNOSIS — E88819 Insulin resistance, unspecified: Secondary | ICD-10-CM

## 2022-09-21 DIAGNOSIS — R5383 Other fatigue: Secondary | ICD-10-CM

## 2022-09-24 DIAGNOSIS — R5383 Other fatigue: Secondary | ICD-10-CM | POA: Insufficient documentation

## 2022-09-26 DIAGNOSIS — M25561 Pain in right knee: Secondary | ICD-10-CM | POA: Diagnosis not present

## 2022-09-28 DIAGNOSIS — M25561 Pain in right knee: Secondary | ICD-10-CM | POA: Diagnosis not present

## 2022-09-29 ENCOUNTER — Other Ambulatory Visit: Payer: Self-pay | Admitting: Adult Health

## 2022-10-03 DIAGNOSIS — M25561 Pain in right knee: Secondary | ICD-10-CM | POA: Diagnosis not present

## 2022-10-04 DIAGNOSIS — F603 Borderline personality disorder: Secondary | ICD-10-CM | POA: Diagnosis not present

## 2022-10-04 DIAGNOSIS — F3181 Bipolar II disorder: Secondary | ICD-10-CM | POA: Diagnosis not present

## 2022-10-04 DIAGNOSIS — F900 Attention-deficit hyperactivity disorder, predominantly inattentive type: Secondary | ICD-10-CM | POA: Diagnosis not present

## 2022-10-04 NOTE — Telephone Encounter (Signed)
error 

## 2022-10-05 DIAGNOSIS — M25561 Pain in right knee: Secondary | ICD-10-CM | POA: Diagnosis not present

## 2022-10-10 DIAGNOSIS — M25561 Pain in right knee: Secondary | ICD-10-CM | POA: Diagnosis not present

## 2022-10-12 DIAGNOSIS — M25561 Pain in right knee: Secondary | ICD-10-CM | POA: Diagnosis not present

## 2022-10-12 DIAGNOSIS — M25511 Pain in right shoulder: Secondary | ICD-10-CM | POA: Diagnosis not present

## 2022-10-12 DIAGNOSIS — M25512 Pain in left shoulder: Secondary | ICD-10-CM | POA: Diagnosis not present

## 2022-10-17 DIAGNOSIS — M25561 Pain in right knee: Secondary | ICD-10-CM | POA: Diagnosis not present

## 2022-10-19 ENCOUNTER — Ambulatory Visit (INDEPENDENT_AMBULATORY_CARE_PROVIDER_SITE_OTHER): Payer: PPO | Admitting: Family Medicine

## 2022-10-19 DIAGNOSIS — M25561 Pain in right knee: Secondary | ICD-10-CM | POA: Diagnosis not present

## 2022-10-24 DIAGNOSIS — M25561 Pain in right knee: Secondary | ICD-10-CM | POA: Diagnosis not present

## 2022-10-26 DIAGNOSIS — M25561 Pain in right knee: Secondary | ICD-10-CM | POA: Diagnosis not present

## 2022-10-31 DIAGNOSIS — M25561 Pain in right knee: Secondary | ICD-10-CM | POA: Diagnosis not present

## 2022-11-02 DIAGNOSIS — M25561 Pain in right knee: Secondary | ICD-10-CM | POA: Diagnosis not present

## 2022-11-07 DIAGNOSIS — M25512 Pain in left shoulder: Secondary | ICD-10-CM | POA: Diagnosis not present

## 2022-11-09 DIAGNOSIS — M25511 Pain in right shoulder: Secondary | ICD-10-CM | POA: Diagnosis not present

## 2022-11-09 DIAGNOSIS — M25512 Pain in left shoulder: Secondary | ICD-10-CM | POA: Diagnosis not present

## 2022-11-10 DIAGNOSIS — Z961 Presence of intraocular lens: Secondary | ICD-10-CM | POA: Diagnosis not present

## 2022-11-10 DIAGNOSIS — H43811 Vitreous degeneration, right eye: Secondary | ICD-10-CM | POA: Diagnosis not present

## 2022-11-10 DIAGNOSIS — H5213 Myopia, bilateral: Secondary | ICD-10-CM | POA: Diagnosis not present

## 2022-11-14 DIAGNOSIS — M25511 Pain in right shoulder: Secondary | ICD-10-CM | POA: Diagnosis not present

## 2022-11-14 DIAGNOSIS — M25512 Pain in left shoulder: Secondary | ICD-10-CM | POA: Diagnosis not present

## 2022-11-16 ENCOUNTER — Encounter (INDEPENDENT_AMBULATORY_CARE_PROVIDER_SITE_OTHER): Payer: Self-pay | Admitting: Physician Assistant

## 2022-11-16 ENCOUNTER — Encounter (INDEPENDENT_AMBULATORY_CARE_PROVIDER_SITE_OTHER): Payer: Self-pay | Admitting: Family Medicine

## 2022-11-16 ENCOUNTER — Ambulatory Visit (INDEPENDENT_AMBULATORY_CARE_PROVIDER_SITE_OTHER): Payer: PPO | Admitting: Physician Assistant

## 2022-11-16 ENCOUNTER — Encounter (INDEPENDENT_AMBULATORY_CARE_PROVIDER_SITE_OTHER): Payer: PPO | Admitting: Family Medicine

## 2022-11-16 VITALS — BP 122/84 | HR 60 | Temp 97.8°F | Ht 69.0 in | Wt 251.0 lb

## 2022-11-16 DIAGNOSIS — Z6837 Body mass index (BMI) 37.0-37.9, adult: Secondary | ICD-10-CM | POA: Diagnosis not present

## 2022-11-16 DIAGNOSIS — M25511 Pain in right shoulder: Secondary | ICD-10-CM

## 2022-11-16 DIAGNOSIS — E559 Vitamin D deficiency, unspecified: Secondary | ICD-10-CM | POA: Diagnosis not present

## 2022-11-16 DIAGNOSIS — M25512 Pain in left shoulder: Secondary | ICD-10-CM | POA: Diagnosis not present

## 2022-11-16 DIAGNOSIS — E88819 Insulin resistance, unspecified: Secondary | ICD-10-CM | POA: Diagnosis not present

## 2022-11-16 DIAGNOSIS — M25561 Pain in right knee: Secondary | ICD-10-CM | POA: Diagnosis not present

## 2022-11-16 NOTE — Progress Notes (Signed)
.smr  Office: [phone]  /  Fax: [phone]  WEIGHT SUMMARY AND BIOMETRICS  Vitals Temp: 97.8 F (36.6 C) BP: 122/84 Pulse Rate: 60 SpO2: 97 %   Anthropometric Measurements Height: 5\' 9"  (1.753 m) Weight: 251 lb (113.9 kg) (has a knee brace on that weighs 3 lbs) BMI (Calculated): 37.05 Weight at Last Visit: 244 lb Weight Lost Since Last Visit: 0 Weight Gained Since Last Visit: 7 lb Starting Weight: 270 lb Total Weight Loss (lbs): 19 lb (8.618 kg)   Body Composition  Body Fat %: 49.7 % Fat Mass (lbs): 124.8 lbs Muscle Mass (lbs): 120 lbs Total Body Water (lbs): 90.6 lbs Visceral Fat Rating : 15   Other Clinical Data Fasting: no Labs: no Today's Visit #: 28     HPI  Chief Complaint: OBESITY  [name] is here to discuss her progress with her obesity treatment plan. She is on the the Category 2 Plan and states she is following her eating plan approximately 60 % of the time. She states she is exercising PT for knee/shoulders 60 minutes 2 times per week.   Interval History:  Since last office visit she is up 7 lbs.   The patient, with a history of obesity and recent right knee surgery, presents for a follow-up visit. She reports struggling with her weight management due to her work schedule and a tendency to make poor food choices after work.  She admits to consuming fast food and soda frequently due to the convenience and her late work hours. She also mentions a recent increase in soda consumption, specifically Dr. Reino Kent, which she acknowledges is contributing to her weight gain.  In addition to her struggles with weight management, the patient is recovering from knee surgery. She reports falling directly on her knee post-surgery, which has caused her some discomfort. She is wearing a Donjoy knee brace, which she finds heavy and uncomfortable.  She continues to follow up regularly with her orthopedics surgeon.  Despite these challenges, she is committed to her  recovery and is attending physical therapy.  The patient also reports shoulder pain, with one shoulder having a 50% torn rotator cuff.  Pharmacotherapy: None for weight loss  TREATMENT PLAN FOR OBESITY:  Recommended Dietary Goals  [place] is currently in the action stage of change. As such, her goal is to continue weight management plan. She has agreed to the Category 2 Plan.  Behavioral Intervention  We discussed the following Behavioral Modification Strategies today: increasing water intake , identifying sources and decreasing liquid calories, and continue to work on maintaining a reduced calorie state, getting the recommended amount of protein, incorporating whole foods, making healthy choices, staying well hydrated and practicing mindfulness when eating..  Additional resources provided today: 100 calorie protein snack      Emotional eating hand out  Recommended Physical Activity Goals  Jenene has been advised to work up to 150 minutes of moderate intensity aerobic activity a week and strengthening exercises 2-3 times per week for cardiovascular health, weight loss maintenance and preservation of muscle mass.   She has agreed to Increase physical activity in their day and reduce sedentary time (increase NEAT). and [facility] her current restrictions with knee bracing and shoulder issues   Pharmacotherapy We discussed various medication options to help Tiffeny with her weight loss efforts and we both agreed to continue to work on nutritional and behavioral strategies to promote weight loss.  Return in about 4 weeks (around 12/14/2022) for Plans to have lab done at [facility] prior to visit.Marland Kitchen She was informed of the importance of frequent follow up visits to maximize her success with intensive lifestyle modifications for her multiple health conditions.  PHYSICAL EXAM:  Blood pressure 122/84, pulse 60, temperature 97.8 F (36.6 C),  height 5\' 9"  (1.753 m), weight 251 lb (113.9 kg), SpO2 97%. Body mass index is 37.07 kg/m.  General: She is overweight, cooperative, alert, well developed, and in no acute distress. Walking with antalgic gait with knee bracing in place.  PSYCH: Has normal mood, affect and thought process.   Cardiovascular: HR 60's BP 122/84 Lungs: Normal breathing effort, no conversational dyspnea.  DIAGNOSTIC DATA REVIEWED:  BMET    Component Value Date/Time   NA 143 12/20/2021 1553   K 4.8 12/20/2021 1553   CL 103 12/20/2021 1553   CO2 25 12/20/2021 1553   GLUCOSE 86 12/20/2021 1553   GLUCOSE 101 (H) 08/17/2021 1526   BUN 10 12/20/2021 1553   CREATININE 0.92 12/20/2021 1553   CREATININE 0.83 09/11/2011 1524   CALCIUM 10.2 12/20/2021 1553   GFRNONAA >60 08/18/2021 1755   GFRAA 69 01/15/2020 0948   Lab Results  Component Value Date   HGBA1C 5.3 03/14/2022   HGBA1C 5.3 07/11/2007   Lab Results  Component Value Date   INSULIN 7.0 03/14/2022   INSULIN 9.3 02/15/2021   Lab Results  Component Value Date   TSH 2.660 02/15/2021   CBC    Component Value Date/Time   WBC 7.3 08/18/2021 1755   RBC 4.40 08/18/2021 1755   HGB 13.5 08/18/2021 1755   HGB 14.7 02/15/2021 1025   HCT 41.2 08/18/2021 1755   HCT 43.8 02/15/2021 1025   PLT 201 08/18/2021 1755   PLT 260 02/15/2021 1025   MCV 93.6 08/18/2021 1755   MCV 91 02/15/2021 1025   MCH 30.7 08/18/2021 1755   MCHC 32.8 08/18/2021 1755   RDW 12.7 08/18/2021 1755   RDW 12.1 02/15/2021 1025   Iron Studies    Component Value Date/Time   IRON 111 07/19/2020 1004   TIBC 286 07/19/2020 1004   FERRITIN 63 07/19/2020 1004   IRONPCTSAT 39 07/19/2020 1004   Lipid Panel     Component Value Date/Time   CHOL 198 03/14/2022 1247   TRIG 90 03/14/2022 1247   HDL 60 03/14/2022 1247   CHOLHDL 3.4 07/06/2021 1129   CHOLHDL 3.7 12/29/2016 0627   VLDL 27 12/29/2016 0627   LDLCALC 122 (H) 03/14/2022 1247   Hepatic Function Panel     Component  Value Date/Time   PROT 6.3 (L) 08/17/2021 1518   PROT 6.1 07/06/2021 1129   ALBUMIN 4.0 08/17/2021 1518   ALBUMIN 4.3 07/06/2021 1129   AST 18 08/17/2021 1518   ALT 18 08/17/2021 1518   ALKPHOS 64 08/17/2021 1518   BILITOT 0.5 08/17/2021 1518   BILITOT 0.4 07/06/2021 1129   BILIDIR 0.1 04/22/2014 0630   IBILI 0.3 04/22/2014 0630      Component Value Date/Time   TSH 2.660 02/15/2021 1025   Nutritional Lab Results  Component Value Date   VD25OH 66.8 12/20/2021   VD25OH 74.2 07/06/2021   VD25OH 49.4 02/15/2021    ASSOCIATED CONDITIONS ADDRESSED TODAY  ASSESSMENT AND PLAN  Problem List Items Addressed This Visit     Morbid obesity (HCC)   Essential hypertension   Insulin resistance - Primary   Vitamin D deficiency   BMI 37.0-37.9, adult  Other fatigue  Obesity Recent weight gain due to decreased mobility post knee surgery and increased intake of high calorie foods and drinks. Patient reports "binge eating"/ boredom eating since knee injury and increased consumption of sugary drinks. -Refer back to Dr. Dewaine Conger for strategies to manage emotional eating/boredom eating.  Provided hand out on emotional eating for more strategies .  -Encourage healthier snack options and provide list of 100 calorie snack options. -Advise to reduce intake of sugary drinks, specifically Dr. Reino Kent, to a couple per week or less. -Encourage increased water intake.  Insulin Resistance Last fasting insulin was 7.0- not at goal. A1c was 5.3- not at goal. Polyphagia:Yes Medication(s): None She is working on a nutrition plan to decrease simple carbohydrates, increase lean proteins and exercise to promote weight loss, improve glycemic control and prevent progression to Type 2 diabetes.   Lab Results  Component Value Date   HGBA1C 5.3 03/14/2022   HGBA1C 5.2 07/06/2021   HGBA1C 5.1 02/15/2021   HGBA1C 5.1 12/29/2016   HGBA1C 5.1 07/19/2016   Lab Results  Component Value Date   INSULIN 7.0  03/14/2022   INSULIN 6.0 07/06/2021   INSULIN 9.3 02/15/2021    Plan:  Continue working on nutrition plan to decrease simple carbohydrates, increase lean proteins and exercise to promote weight loss, improve glycemic control and prevent progression to Type 2 diabetes.  She did not go to [facility] for her lab draw yet and plans to go a couple of days before the next planned office visit for CMET, fasting insulin level and A1c as well as fasting lipids.   Vitamin D Deficiency Vitamin D is at goal of 50.  Most recent vitamin D level was 66.8. She is on  prescription ergocalciferol 50,000 IU weekly. Lab Results  Component Value Date   VD25OH 66.8 12/20/2021   VD25OH 74.2 07/06/2021   VD25OH 49.4 02/15/2021    Plan: Continue  prescription ergocalciferol 50,000 IU weekly Low vitamin D levels can be associated with adiposity and may result in leptin resistance and weight gain. Also associated with fatigue. Currently on vitamin D supplementation without any adverse effects.  Recheck vitamin D level prior to next office visit to optimize supplementation/avoid over supplementation.   Post Knee Surgery/ Knee pain Patient history of falls- post surgery of right knee. Wearing a Donjoy knee brace. -Continue physical therapy for knee and shoulder issues. Continue to follow up with orthopedics as instructed.  -Encourage safe mobility to prevent falls.  Continue to work on weight loss to help with recovery following knee surgery.   Shoulder Pain Patient reports pain in both shoulders, with a 50% torn rotator cuff in one. -Continue physical therapy.  General Health Maintenance -Plan to rejoin Silver [employer]neakers program at the [facility] for additional exercise. -Order blood work to be done a few days prior to next visit.  ATTESTASTION STATEMENTS:  Reviewed by clinician on day of visit: allergies, medications, problem list, medical history, surgical history, family history, social history, and previous  encounter notes.   I have personally spent 38 minutes total time today in preparation, patient care, nutritional counseling and documentation for this visit, including the following: review of clinical lab tests; review of medical tests/procedures/services.      Davonta Stroot, PA-C

## 2022-11-20 DIAGNOSIS — F319 Bipolar disorder, unspecified: Secondary | ICD-10-CM | POA: Diagnosis not present

## 2022-11-20 DIAGNOSIS — Z0000 Encounter for general adult medical examination without abnormal findings: Secondary | ICD-10-CM | POA: Diagnosis not present

## 2022-11-20 DIAGNOSIS — G8929 Other chronic pain: Secondary | ICD-10-CM | POA: Diagnosis not present

## 2022-11-20 DIAGNOSIS — N912 Amenorrhea, unspecified: Secondary | ICD-10-CM | POA: Diagnosis not present

## 2022-11-20 DIAGNOSIS — Z6837 Body mass index (BMI) 37.0-37.9, adult: Secondary | ICD-10-CM | POA: Diagnosis not present

## 2022-11-20 DIAGNOSIS — I10 Essential (primary) hypertension: Secondary | ICD-10-CM | POA: Diagnosis not present

## 2022-11-20 DIAGNOSIS — G43809 Other migraine, not intractable, without status migrainosus: Secondary | ICD-10-CM | POA: Diagnosis not present

## 2022-11-21 DIAGNOSIS — M25512 Pain in left shoulder: Secondary | ICD-10-CM | POA: Diagnosis not present

## 2022-11-21 DIAGNOSIS — M25511 Pain in right shoulder: Secondary | ICD-10-CM | POA: Diagnosis not present

## 2022-11-22 DIAGNOSIS — I10 Essential (primary) hypertension: Secondary | ICD-10-CM | POA: Diagnosis not present

## 2022-11-22 DIAGNOSIS — N912 Amenorrhea, unspecified: Secondary | ICD-10-CM | POA: Diagnosis not present

## 2022-11-22 DIAGNOSIS — Z0000 Encounter for general adult medical examination without abnormal findings: Secondary | ICD-10-CM | POA: Diagnosis not present

## 2022-11-23 DIAGNOSIS — M25512 Pain in left shoulder: Secondary | ICD-10-CM | POA: Diagnosis not present

## 2022-11-23 DIAGNOSIS — M25511 Pain in right shoulder: Secondary | ICD-10-CM | POA: Diagnosis not present

## 2022-11-28 DIAGNOSIS — M25511 Pain in right shoulder: Secondary | ICD-10-CM | POA: Diagnosis not present

## 2022-11-28 DIAGNOSIS — M25512 Pain in left shoulder: Secondary | ICD-10-CM | POA: Diagnosis not present

## 2022-11-29 ENCOUNTER — Other Ambulatory Visit: Payer: Self-pay | Admitting: Adult Health

## 2022-12-01 DIAGNOSIS — M25511 Pain in right shoulder: Secondary | ICD-10-CM | POA: Diagnosis not present

## 2022-12-01 DIAGNOSIS — M25512 Pain in left shoulder: Secondary | ICD-10-CM | POA: Diagnosis not present

## 2022-12-04 ENCOUNTER — Telehealth (INDEPENDENT_AMBULATORY_CARE_PROVIDER_SITE_OTHER): Payer: PPO | Admitting: Psychology

## 2022-12-05 DIAGNOSIS — M25511 Pain in right shoulder: Secondary | ICD-10-CM | POA: Diagnosis not present

## 2022-12-05 DIAGNOSIS — M25512 Pain in left shoulder: Secondary | ICD-10-CM | POA: Diagnosis not present

## 2022-12-07 DIAGNOSIS — M25512 Pain in left shoulder: Secondary | ICD-10-CM | POA: Diagnosis not present

## 2022-12-07 DIAGNOSIS — M25511 Pain in right shoulder: Secondary | ICD-10-CM | POA: Diagnosis not present

## 2022-12-11 ENCOUNTER — Encounter (INDEPENDENT_AMBULATORY_CARE_PROVIDER_SITE_OTHER): Payer: Self-pay | Admitting: Physician Assistant

## 2022-12-12 DIAGNOSIS — M25511 Pain in right shoulder: Secondary | ICD-10-CM | POA: Diagnosis not present

## 2022-12-12 DIAGNOSIS — M25512 Pain in left shoulder: Secondary | ICD-10-CM | POA: Diagnosis not present

## 2022-12-14 ENCOUNTER — Ambulatory Visit (INDEPENDENT_AMBULATORY_CARE_PROVIDER_SITE_OTHER): Payer: PPO | Admitting: Physician Assistant

## 2022-12-14 DIAGNOSIS — M25511 Pain in right shoulder: Secondary | ICD-10-CM | POA: Diagnosis not present

## 2022-12-14 DIAGNOSIS — M25512 Pain in left shoulder: Secondary | ICD-10-CM | POA: Diagnosis not present

## 2022-12-19 DIAGNOSIS — M25511 Pain in right shoulder: Secondary | ICD-10-CM | POA: Diagnosis not present

## 2022-12-19 DIAGNOSIS — M25512 Pain in left shoulder: Secondary | ICD-10-CM | POA: Diagnosis not present

## 2022-12-21 DIAGNOSIS — M25511 Pain in right shoulder: Secondary | ICD-10-CM | POA: Diagnosis not present

## 2022-12-21 DIAGNOSIS — M25512 Pain in left shoulder: Secondary | ICD-10-CM | POA: Diagnosis not present

## 2022-12-26 ENCOUNTER — Other Ambulatory Visit: Payer: Self-pay | Admitting: Adult Health

## 2022-12-26 ENCOUNTER — Ambulatory Visit: Payer: PPO | Admitting: Adult Health

## 2022-12-26 VITALS — BP 120/76 | HR 76 | Ht 70.0 in | Wt 257.0 lb

## 2022-12-26 DIAGNOSIS — G43109 Migraine with aura, not intractable, without status migrainosus: Secondary | ICD-10-CM | POA: Diagnosis not present

## 2022-12-26 DIAGNOSIS — Z5181 Encounter for therapeutic drug level monitoring: Secondary | ICD-10-CM | POA: Diagnosis not present

## 2022-12-26 DIAGNOSIS — Z9889 Other specified postprocedural states: Secondary | ICD-10-CM | POA: Diagnosis not present

## 2022-12-26 DIAGNOSIS — M25561 Pain in right knee: Secondary | ICD-10-CM | POA: Diagnosis not present

## 2022-12-26 MED ORDER — NURTEC 75 MG PO TBDP: ORAL_TABLET | ORAL | 11 refills | Status: AC
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
## 2022-12-26 NOTE — Patient Instructions (Signed)
Your Plan:  Continue Depakote, verapamil, and Depakote Try nurtec for acute migraine If your symptoms worsen or you develop new symptoms please let us know.   Thank you for coming to see [place] at [facility]. I hope we have been able to provide you high quality care today.  You may receive a patient satisfaction survey over the next few weeks. We would appreciate your feedback and comments so that we may continue to improve ourselves and the health of our patients.

## 2022-12-27 DIAGNOSIS — M25511 Pain in right shoulder: Secondary | ICD-10-CM | POA: Diagnosis not present

## 2022-12-27 DIAGNOSIS — M25512 Pain in left shoulder: Secondary | ICD-10-CM | POA: Diagnosis not present

## 2022-12-27 LAB — VALPROIC ACID LEVEL: Valproic Acid Lvl: 70 ug/mL (ref 50–100)

## 2023-01-02 DIAGNOSIS — M25511 Pain in right shoulder: Secondary | ICD-10-CM | POA: Diagnosis not present

## 2023-01-02 DIAGNOSIS — M25512 Pain in left shoulder: Secondary | ICD-10-CM | POA: Diagnosis not present

## 2023-01-03 ENCOUNTER — Telehealth: Payer: Self-pay

## 2023-01-03 ENCOUNTER — Other Ambulatory Visit (HOSPITAL_COMMUNITY): Payer: Self-pay

## 2023-01-03 DIAGNOSIS — F3181 Bipolar II disorder: Secondary | ICD-10-CM | POA: Diagnosis not present

## 2023-01-03 DIAGNOSIS — F603 Borderline personality disorder: Secondary | ICD-10-CM | POA: Diagnosis not present

## 2023-01-03 DIAGNOSIS — F900 Attention-deficit hyperactivity disorder, predominantly inattentive type: Secondary | ICD-10-CM | POA: Diagnosis not present

## 2023-01-03 NOTE — Telephone Encounter (Signed)
Pharmacy Patient Advocate Encounter   Received notification from CoverMyMeds that prior authorization for Nurtec 75MG  dispersible tablets is required/requested.   Insurance verification completed.   The patient is insured through [facility] ADVANTAGE/RX ADVANCE   Prior Authorization form/request asks a question that requires your assistance. Please see the question below and advise accordingly.

## 2023-01-04 DIAGNOSIS — M25512 Pain in left shoulder: Secondary | ICD-10-CM | POA: Diagnosis not present

## 2023-01-04 DIAGNOSIS — M25511 Pain in right shoulder: Secondary | ICD-10-CM | POA: Diagnosis not present

## 2023-01-04 NOTE — Telephone Encounter (Signed)
Yes we should try to avoid

## 2023-01-04 NOTE — Telephone Encounter (Signed)
I called pt asked her if she had tried any other triptans she said that sumatriptan, she does not take very often at all,  she may have tried other (eletriptan) 2015 (but she does not really recall). Contraindicated due to possible stroke like sx.

## 2023-01-08 ENCOUNTER — Telehealth (INDEPENDENT_AMBULATORY_CARE_PROVIDER_SITE_OTHER): Payer: PPO | Admitting: Psychology

## 2023-01-08 DIAGNOSIS — F5089 Other specified eating disorder: Secondary | ICD-10-CM | POA: Diagnosis not present

## 2023-01-08 DIAGNOSIS — F319 Bipolar disorder, unspecified: Secondary | ICD-10-CM | POA: Diagnosis not present

## 2023-01-08 DIAGNOSIS — F909 Attention-deficit hyperactivity disorder, unspecified type: Secondary | ICD-10-CM | POA: Diagnosis not present

## 2023-01-08 NOTE — Progress Notes (Signed)
Office: [phone]  /  Fax: [phone]    Date: January 08, 2023    Appointment Start Time: 12:00pm Duration: 62 minutes Provider: Lawerance Cruel, Psy.D. Type of Session: Intake for Individual Therapy  Location of Patient: Home (private location) Location of Provider: Provider's home (private office) Type of Contact: Telepsychological Visit via MyChart Video Visit  Informed Consent: Prior to proceeding with today's appointment, two pieces of identifying information were obtained. In addition, [name]'s physical location at the time of this appointment was obtained as well a phone number she could be reached at in the event of technical difficulties. [name] and this provider participated in today's telepsychological service.   The provider's role was explained to [name]. The provider reviewed and discussed issues of confidentiality, privacy, and limits therein (e.g., reporting obligations). In addition to verbal informed consent, written informed consent for psychological services was obtained prior to the initial appointment. Since the clinic is not a 24/7 crisis center, mental health emergency resources were shared and this  provider explained MyChart, e-mail, voicemail, and/or other messaging systems should be utilized only for non-emergency reasons. This provider also explained that information obtained during appointments will be placed in [name]'s medical record and relevant information will be shared with other providers at [facility] at any locations for coordination of care. [name] agreed information may be shared with other [facility] providers as needed for coordination of care and by signing the service agreement document, she provided written consent for coordination of care. Prior to initiating telepsychological services, [name] completed an informed consent document, which included the development of a safety plan (i.e., an emergency contact and  emergency resources) in the event of an emergency/crisis. [name] verbally acknowledged understanding she is ultimately responsible for understanding her insurance benefits for telepsychological and in-person services. This provider also reviewed confidentiality, as it relates to telepsychological services. [name]  acknowledged understanding that appointments cannot be recorded without both party consent and she is aware she is responsible for securing confidentiality on her end of the session. [name] verbally consented to proceed.  Of note, [name]'s mother, [name], was present in the room watching television. Limits of confidentiality were discussed. [name] acknowledged understanding and provided verbal consent to proceed. She also wore headphones for the duration of the appointment.   Chief Complaint/HPI: [name] was referred by Lazaro Arms, PA-C on 11/16/2022. Per the note for that OV, "Refer back to Dr. Dewaine Conger for strategies to manage emotional eating/boredom eating. Provided hand out on emotional eating for more strategies ." Additionally, this provider last met with [name] on 01/17/2022.  During today's appointment, [name] discussed she "tore [her] ACL" in June and is experiencing other health-related concerns, adding she is currently attending physical activity. She further shared experiencing limited mobility and being out of work for 5.5 months. She resumed work in August. She was verbally administered a questionnaire assessing various behaviors related to emotional eating behaviors. Malynda endorsed the following as occurring since 01/17/2022: experience food cravings on a regular basis, eat certain foods when you are anxious, stressed, depressed, or your feelings are hurt, use food to help you cope with emotional situations, find food is comforting to you, overeat when you are worried about something, not worry about what you eat when you are in a good mood, and eat as a reward. She shared she  craves sweet and salty foods (e.g., chips, candy). Mishay described the current frequency of emotional eating behaviors as "3-4 times a week." In  addition, Zana endorsed engagement in binge eating behaviors, noting she may eat large quantities of food over the course of a couple hours while watching TV or playing games on her phone. She described the current frequency of binge eating behaviors "as at least three times a week." Aletha recalled the aforementioned has been occurring "on and off" since mid-April 2024. While she discussed experiencing a sense of lack of control over eating during the episodes, she also discussed typically eating when physically hungry and stopping when full. Datra reported she may eat more quickly than normal and often feels very guilty after due to poor choices. She denied engagement in any compensatory behaviors. Zariel denied attending therapeutic services for eating-related concerns since the last appointment with this provider.   Mental Status Examination:  Appearance: neat Behavior: appropriate to circumstances Mood: neutral Affect: mood congruent Speech: WNL Eye Contact: appropriate Psychomotor Activity: WNL Gait: unable to assess  Thought Process: linear, logical, and goal directed and denies suicidal, homicidal, and self-harm ideation, plan and intent  Thought Content/Perception: no hallucinations, delusions, bizarre thinking or behavior endorsed or observed Orientation: AAOx4 Memory/Concentration: intact Insight/Judgment: fair  Family & Psychosocial History: [name] reported she is not in a relationship, and she does not have children. She indicated she is currently employed part-time at [employer]. Additionally, [name] shared her highest level of education obtained is a bachelor's degree. Currently, [name]'s social support system consists of her mother, best friend Scientist, research (physical sciences)), and co-worker [name]). Moreover, [name] stated she resides with her mother.   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   rt chest wall nodule removal   GANGLION CYST EXCISION  2011   lipoma removal     right bunioectomy     SHOULDER SURGERY  01/13/2011   right, partial tear   tumor resection left thigh     Current Outpatient Medications on File Prior to Visit  Medication Sig Dispense Refill   ALFALFA PO Take 500 mg by mouth daily.     amphetamine-dextroamphetamine (ADDERALL XR) 20 MG 24 hr capsule Take  20 mg by mouth daily.     Ascorbic Acid (VITAMIN C) 1000 MG tablet Take 1,000 mg by mouth daily.     calcium carbonate (OSCAL) 1500 (600 Ca) MG TABS tablet Take 1,500 mg by mouth in the morning and at bedtime.     cetirizine (ZYRTEC) 10 MG tablet Take 10 mg by mouth daily.     Cholecalciferol (D3 5000) 125 MCG (5000 UT) capsule Take 1 capsule (5,000 Units total) by mouth daily.     divalproex (DEPAKOTE ER) 500 MG 24 hr tablet TAKE 2 TABLETS BY MOUTH ONCE DAILY AT NIGHT 180 tablet 0   docusate sodium (COLACE) 100 MG capsule Take 1 capsule (100 mg total) by mouth 2 (two) times daily. 60 capsule 0   Galcanezumab-gnlm (EMGALITY) 120 MG/ML SOSY INJECT 120 MG INTO THE SKIN EVERY 30 DAYS 1 mL 1   hydrOXYzine (VISTARIL) 50 MG capsule Take 50 mg by mouth in the morning and at bedtime.   2   lamoTRIgine (LAMICTAL) 150 MG tablet Take 300 mg by mouth at bedtime.  1   LATUDA 20 MG TABS tablet Take 20 mg by mouth every morning.     lurasidone (LATUDA) 80 MG TABS tablet Take 1 tablet (80 mg total) by mouth daily with supper. (Along with 20mg  in AM for mood) 30 tablet 0   metoprolol succinate (TOPROL-XL) 100 MG 24 hr tablet Take 1 tablet (100 mg total) by mouth daily. Take with or immediately following a meal. 30 tablet 0   Multiple Vitamin (MULTIVITAMIN WITH MINERALS) TABS tablet Take 1 tablet by mouth daily.     mupirocin ointment (BACTROBAN) 2 % Apply 1 Application topically as needed (irritation).     Omega-3 Fatty Acids (OMEGA 3 PO) Take 1 capsule by mouth daily.     promethazine (PHENERGAN) 25 MG tablet Take 1 tablet (25 mg total) by mouth 2 (two) times daily as needed for nausea or vomiting. 30 tablet 1   Rimegepant Sulfate (NURTEC) 75 MG TBDP Take 1 tablet at the onset of migraine. Only 1 tablet in 24 hours. 9 tablet 11   traZODone (DESYREL) 100 MG tablet Take 1 tablet (100 mg total) by mouth at bedtime. 30 tablet 0   verapamil (CALAN-SR) 120 MG CR tablet TAKE 1 TABLET(120 MG) BY MOUTH AT BEDTIME 90  tablet 1   Zinc 50 MG TABS Take 50 mg by mouth daily.     No current facility-administered medications on file prior to visit.  [name] stated she is medication compliant.   Mental Health History: [name] reported since the last appointment with this provider, she met with her primary therapist [name], [facility]) "a handful of times," but had to cease services due to financial concerns. She will resume in January 2025.  She stated she continues to meet with her psychiatric provider Joyce Robinson, NP with [facility]) every three months for medication management. Natea denied any hospitalizations for psychiatric concerns since the last appointment with this provider. Koralyn denied any changes in her family history as it relates to mental health/substance abuse. Furthermore, Gurseerat reported there is no history of trauma including psychological, physical , and sexual abuse, as well  as neglect.  [name] described her typical mood lately as "frustrated" due to work and home-related tasks/stressors. She also reported ongoing worry related to her mother's deteriorating health. Of note, she discussed experiencing suicidal ideation "here and there" since the last appointment with this provider. She denied experiencing suicidal plan and intent. [name] stated she could not recall the last time she experienced suicidal ideation as it has been awhile. The following protective factors were identified for Isreal: Christmas, mother, and brother. If she were to become overwhelmed in the future, which is a sign that a crisis may occur, she identified the following coping skills she could engage in: watch videos on YouTube, play games on phone, and watch TV with mom. It was recommended the aforementioned be written down and developed into a coping card for future reference. Psychoeducation regarding the importance of reaching out to a trusted individual and/or utilizing emergency resources if there is  a change in emotional status and/or there is an inability to ensure safety was provided. [name]'s confidence in reaching out to a trusted individual and/or utilizing emergency resources should there be an intensification in emotional status and/or there is an inability to ensure safety was assessed on a scale of one to ten where one is not confident and ten is extremely confident. She reported her confidence is a 10. Additionally, Bulah denied current access to firearms and/or weapons. She also denied current suicidal ideation, plan, and intent; history of and current homicidal ideation, plan, and intent; and current engagement in self-harm. Evon denied current alcohol use. She denied tobacco use. She denied illicit/recreational substance use. Furthermore, Oshun indicated she is not experiencing the following: hallucinations and delusions, paranoia, symptoms of mania , social withdrawal, crying spells, panic attacks, and obsessions and compulsions.   Legal History: Karsen reported there is no history of legal involvement.   Structured Assessments Results: The Patient Health Questionnaire-9 (PHQ-9) is a self-report measure that assesses symptoms and severity of depression over the course of the last two weeks. Jazmynne obtained a score of 5 suggesting mild depression. Malillany finds the endorsed symptoms to be very difficult. [0= Not at all; 1= Several days; 2= More than half the days; 3= Nearly every day] Little interest or pleasure in doing things 0  Feeling down, depressed, or hopeless 0  Trouble falling or staying asleep, or sleeping too much 1  Feeling tired or having little energy 0  Poor appetite or overeating 1  Feeling bad about yourself --- or that you are a failure or have let yourself or your family down 1  Trouble concentrating on things, such as reading the newspaper or watching television 2  Moving or speaking so slowly that other people could have noticed? Or the opposite --- being so  fidgety or restless that you have been moving around a lot more than usual 0  Thoughts that you would be better off dead or hurting yourself in some way 0  PHQ-9 Score 5    The Generalized Anxiety Disorder-7 (GAD-7) is a brief self-report measure that assesses symptoms of anxiety over the course of the last two weeks. Lanecia obtained a score of 5 suggesting mild anxiety. Telissa finds the endorsed symptoms to be extremely difficult. [0= Not at all; 1= Several days; 2= Over half the days; 3= Nearly every day] Feeling nervous, anxious, on edge 1  Not being able to stop or control worrying 1  Worrying too much about different things 1  Trouble relaxing 0  Being so restless  that it's hard to sit still 0  Becoming easily annoyed or irritable 1  Feeling afraid as if something awful might happen-worry regarding mother's health 1  GAD-7 Score 5   Interventions:  Conducted a chart review Verbally administered PHQ-9 and GAD-7 for symptom monitoring Verbally administered Food & Mood questionnaire to assess various behaviors related to emotional eating Provided emphatic reflections and validation Conducted a risk assessment Recommended/discussed option for longer-term therapeutic services Psychoeducation provided regarding SMART goals Engaged pt in goal setting  Diagnostic Impressions & Provisional DSM-5 Diagnosis(es): Ariadne has a history of engagement in emotional/binge eating behaviors and described the current frequency a multiple times a week. She denied engagement in any other disordered eating behaviors. Based on the aforementioned, the following diagnosis was assigned: F50.89 Other Specified Feeding or Eating Disorder, Emotional and Binge Eating Behaviors. Per chart review and Ashlynn's self-report, she is currently prescribed psychotropic medications to address bipolar and ADHD-related symptomatolgy. Based on the aforementioned and this provider's role with the clinic/limited scope of this  appointment, the following diagnoses were assigned: F90.9 Unspecified Attention-Deficit/Hyperactivity Disorder, and F31.9 Unspecified Bipolar and Related Disorder.   Plan: Keisy re-established care with this provider. The next appointment is scheduled for 01/16/2023 at 11am, which will be via MyChart Video Visit. The following treatment goal was established: increase coping skills. This provider will regularly review the treatment plan and medical chart to keep informed of status changes. Jasleene expressed understanding and agreement with the initial treatment plan of care. She will re-initiate services with her primary therapist.   Psychoeducation regarding SMART goals was provided and Dayli was engaged in goal setting. The following goal was established to reduce sugar drinks: Mechille will drink no more than 24 oz of Dr. Reino Kent daily between now and the next appointment with this provider.

## 2023-01-08 NOTE — Telephone Encounter (Signed)
Pharmacy Patient Advocate Encounter  Received notification from [facility] [employer] that Prior Authorization for Nurtec has been APPROVED from 01/05/2023 to 04/05/2023

## 2023-01-09 DIAGNOSIS — M25511 Pain in right shoulder: Secondary | ICD-10-CM | POA: Diagnosis not present

## 2023-01-09 DIAGNOSIS — M25512 Pain in left shoulder: Secondary | ICD-10-CM | POA: Diagnosis not present

## 2023-01-11 DIAGNOSIS — M25511 Pain in right shoulder: Secondary | ICD-10-CM | POA: Diagnosis not present

## 2023-01-11 DIAGNOSIS — M25561 Pain in right knee: Secondary | ICD-10-CM | POA: Diagnosis not present

## 2023-01-11 DIAGNOSIS — M25512 Pain in left shoulder: Secondary | ICD-10-CM | POA: Diagnosis not present

## 2023-01-15 ENCOUNTER — Ambulatory Visit (INDEPENDENT_AMBULATORY_CARE_PROVIDER_SITE_OTHER): Payer: PPO | Admitting: Physician Assistant

## 2023-01-15 ENCOUNTER — Encounter (INDEPENDENT_AMBULATORY_CARE_PROVIDER_SITE_OTHER): Payer: Self-pay | Admitting: Physician Assistant

## 2023-01-15 VITALS — BP 114/70 | HR 58 | Temp 98.0°F | Ht 69.0 in | Wt 255.0 lb

## 2023-01-15 DIAGNOSIS — M25512 Pain in left shoulder: Secondary | ICD-10-CM | POA: Diagnosis not present

## 2023-01-15 DIAGNOSIS — Z6837 Body mass index (BMI) 37.0-37.9, adult: Secondary | ICD-10-CM

## 2023-01-15 DIAGNOSIS — M25511 Pain in right shoulder: Secondary | ICD-10-CM | POA: Diagnosis not present

## 2023-01-15 DIAGNOSIS — F5089 Other specified eating disorder: Secondary | ICD-10-CM | POA: Insufficient documentation

## 2023-01-15 DIAGNOSIS — E88819 Insulin resistance, unspecified: Secondary | ICD-10-CM

## 2023-01-15 DIAGNOSIS — E559 Vitamin D deficiency, unspecified: Secondary | ICD-10-CM

## 2023-01-15 DIAGNOSIS — M25561 Pain in right knee: Secondary | ICD-10-CM | POA: Diagnosis not present

## 2023-01-15 NOTE — Progress Notes (Signed)
SUBJECTIVE:  Chief Complaint: Obesity  Interim History: She is up 4 lbs since her last visit.  [name], a 52 year old individual with a history of insulin resistance, vitamin D deficiency, and emotional eating behaviors, presents for a follow-up visit regarding her obesity treatment plan. She has recently experienced weight regain, gaining an additional 4 pounds since her last visit, with 1.6 pounds attributed to muscle mass and the remainder to increased adipose mass. This weight gain is associated with a recent knee injury that has significantly impacted her mobility. Despite wearing a brace and participating in a physical therapy program, she continues to struggle with mobility.  She reports a recent change in her diet due to the Thanksgiving holiday, with a noted increase in protein intake. She also mentions receiving a knee injection on November 26th and another injection in the bursa below the knee a week later. She reports improvement in her right shoulder but slower progress in her left shoulder.  She expresses frustration with her current emotional state, citing financial constraints preventing her from continuing therapy. She also mentions stress from her work environment and familial pressures. Despite these challenges, she expresses a desire to improve her health and has started implementing changes in her diet, such as reducing soda intake and increasing water consumption. She also mentions a recent discussion with her behavioral nurse practitioner about resuming therapy sessions in the new year.  She expresses a strong desire to regain control over her weight and health, acknowledging the need for increased physical activity and dietary changes. She is scheduled to see Dr. Dewaine Conger, who she has worked with in the past, for further management of her emotional eating behaviors.  [name] is here to discuss her progress with her obesity treatment plan. She is on the Category 2 Plan and  states she is following her eating plan approximately 50 % of the time. She states she is exercising Going to PT 60 minutes 2 times per week.   OBJECTIVE: Visit Diagnoses: Problem List Items Addressed This Visit     Morbid obesity (HCC)   Insulin resistance - Primary   Vitamin D deficiency   BMI 37.0-37.9, adult   Other Specified Feeding or Eating Disorder, Emotional and Binge Eating Behaviors   Pain in joint of right knee  Obesity Weight regain of 4 pounds since last visit.  Emotional eating behaviors exacerbated by stress and lack of support. Mobility issues due to knee injury. Discussed reducing soda intake, increasing water consumption, and keeping a food journal. Emphasized mental health support for emotional eating. - Continue physical therapy for knee injury - Use a food journal to track intake - Gradually reduce soda intake - Increase water intake - Seek cost-effective therapy options through primary care provider - Follow-up with behavioral nurse practitioner starting first of the year - Continue visits with Dr. Dewaine Conger for emotional eating management - Set a goal of 1200-1300 calories per day and 85 grams of protein   Insulin Resistance Last fasting insulin was 7.0- nearing goal. A1c was 5.3- at goal. Polyphagia:Yes- at times.  Medication(s): None She is working on a nutrition plan to decrease simple carbohydrates, increase lean proteins and exercise to promote weight loss, improve glycemic control and prevent progression to Type 2 diabetes.  Notes decreased cravings when able to eat more on plan.  ] Lab Results  Component Value Date   HGBA1C 5.3 03/14/2022   HGBA1C 5.2 07/06/2021   HGBA1C 5.1 02/15/2021   HGBA1C 5.1 12/29/2016   HGBA1C  5.1 07/19/2016   Lab Results  Component Value Date   INSULIN 7.0 03/14/2022   INSULIN 6.0 07/06/2021   INSULIN 9.3 02/15/2021    Plan:  Continue working on nutrition plan to decrease simple carbohydrates, increase lean  proteins and exercise to promote weight loss, improve glycemic control and prevent progression to Type 2 diabetes.  Plan to recheck fasting labs at next visit.   Vitamin D Deficiency Vitamin D is at goal of 50.  Most recent vitamin D level was 66.8. She is on OTC vitamin D3 5000 IU daily. No n/v or muscle weakness with vitamin D supplementation.  Lab Results  Component Value Date   VD25OH 66.8 12/20/2021   VD25OH 74.2 07/06/2021   VD25OH 49.4 02/15/2021    Plan: Continue OTC vitamin D3 5000 IU daily Low vitamin D levels can be associated with adiposity and may result in leptin resistance and weight gain. Also associated with fatigue. Currently on vitamin D supplementation without any adverse effects.  Recheck level next visit   Emotional Eating Emotional eating behaviors exacerbated by stress and lack of support. Discussed reducing soda intake, increasing water consumption, keeping a food journal, and seeking therapy. Plan to start therapy twice a week beginning first of the year. - Continue visits with Dr. Dewaine Conger for emotional eating behavior strategies as well.  - Start therapy twice a week beginning first of the year - Use a food journal or log to track intake - Gradually reduce soda intake as previously discussed - Increase water intake - Avoid keeping food and candy readily available.   Right Knee Injury/Ongoing Pain Patient reports received joint injection on November 26 and bursa injection last Thursday. Continues to wear a brace and participate in physical therapy but reports ongoing mobility struggles. Follow-up with surgeon on December 24 to determine if more physical therapy is needed. - Continue wearing knee brace as instructed - Continue physical therapy per orthopedics -Has follow up with orthopedics scheduled for next week.     General Health Maintenance Advised to maintain a healthy diet and lifestyle. Blood work planned for next visit. - Schedule fasting labs  for next visit - Encourage healthy eating habits - Advise on maintaining a food journal  Follow-up - Follow-up with Dr. Dewaine Conger on December 17 - Follow-up with surgeon on December 24 - Schedule next visit for fasting labs   Vitals Temp: 98 F (36.7 C) BP: 114/70 Pulse Rate: (!) 58 SpO2: 98 %   Anthropometric Measurements Height: 5\' 9"  (1.753 m) Weight: 255 lb (115.7 kg) (brace on leg) BMI (Calculated): 37.64 Weight at Last Visit: 251 lb Weight Lost Since Last Visit: 0 Weight Gained Since Last Visit: 4 lb Starting Weight: 270 lb Total Weight Loss (lbs): 15 lb (6.804 kg) Peak Weight: 308 lb   Body Composition  Body Fat %: 49.9 % Fat Mass (lbs): 127.4 lbs Muscle Mass (lbs): 121.6 lbs Total Body Water (lbs): 95.2 lbs Visceral Fat Rating : 14   Other Clinical Data Fasting: no Labs: no Today's Visit #: 29 Starting Date: 02/15/21     ASSESSMENT AND PLAN:  Diet: [name] is currently in the action stage of change. As such, her goal is to get back to weight loss efforts. She has agreed to Category 2 Plan and keeping a food journal and adhering to recommended goals of 1200-1300 calories and 85 grams of protein.using MyFitnessPal or written log.   Exercise: [name] has been instructed  continue PT and increase activity level as able following  knee injury  for weight loss and overall health benefits.   Behavior Modification:  We discussed the following Behavioral Modification Strategies today: increasing lean protein intake, decreasing simple carbohydrates, increasing vegetables, increase H2O intake, increase high fiber foods, no skipping meals, emotional eating strategies , holiday eating strategies, planning for success, and keep a strict food journal. We discussed various medication options to help [name] with her weight loss efforts and we both agreed to continue to work on nutritional and behavioral strategies to promote weight loss.  .  Return in about 4 weeks  (around 02/12/2023).Marland Kitchen She was informed of the importance of frequent follow up visits to maximize her success with intensive lifestyle modifications for her multiple health conditions.  Attestation Statements:   Reviewed by clinician on day of visit: allergies, medications, problem list, medical history, surgical history, family history, social history, and previous encounter notes.   Time spent on visit including pre-visit chart review and post-visit care and charting was 42 minutes.    Senora Lacson, PA-C

## 2023-01-16 ENCOUNTER — Telehealth (INDEPENDENT_AMBULATORY_CARE_PROVIDER_SITE_OTHER): Payer: PPO | Admitting: Psychology

## 2023-01-16 DIAGNOSIS — F909 Attention-deficit hyperactivity disorder, unspecified type: Secondary | ICD-10-CM | POA: Diagnosis not present

## 2023-01-16 DIAGNOSIS — F5089 Other specified eating disorder: Secondary | ICD-10-CM

## 2023-01-16 DIAGNOSIS — F319 Bipolar disorder, unspecified: Secondary | ICD-10-CM

## 2023-01-16 NOTE — Progress Notes (Signed)
  Office: [phone]  /  Fax: [phone]    Date: January 16, 2023  Appointment Start Time: 11:03am Duration: 29 minutes Provider: Lawerance Cruel, Psy.D. Type of Session: Individual Therapy  Location of Patient: Home (private location) Location of Provider: Provider's Home (private office) Type of Contact: Telepsychological Visit via MyChart Video Visit  Session Content: [name] is a 52 y.o. female presenting for a follow-up appointment to address the previously established treatment goal of increasing coping skills.Today's appointment was a telepsychological visit. [name] provided verbal consent for today's telepsychological appointment and she is aware she is responsible for securing confidentiality on her end of the session. Prior to proceeding with today's appointment, [name] physical location at the time of this appointment was obtained as well a phone number she could be reached at in the event of technical difficulties. [name] and this provider participated in today's telepsychological service.   This provider conducted a brief check-in. [name] reported challenges with reducing soda intake initially, adding she has increased water intake and reducing soda intake in the past few days. She also shared about her visit with the clinic yesterday. A risk assessment was completed. [name] denied experiencing suicidal, self-harm and homicidal ideation, plan, and intent since the last appointment with this provider. She continues to acknowledge understanding regarding the importance of reaching out to trusted individuals and/or emergency resources if she is unable to ensure safety. To further assist with coping and reduce emotional and binge eating behaviors, psychoeducation provided regarding mindful eating strategies (sit down, slowly chew, savor, simplify, and smile). Overall, [name] was receptive to today's appointment as evidenced by openness to sharing, responsiveness to feedback, and  willingness to implement discussed strategies .  Mental Status Examination:  Appearance: neat Behavior: appropriate to circumstances Mood: neutral Affect: mood congruent Speech: WNL Eye Contact: appropriate Psychomotor Activity: WNL Gait: unable to assess Thought Process: linear, logical, and goal directed and denies suicidal, homicidal, and self-harm ideation, plan and intent  Thought Content/Perception: no hallucinations, delusions, bizarre thinking or behavior endorsed or observed Orientation: AAOx4 Memory/Concentration: intact Insight: fair Judgment: fair  Interventions:  Conducted a brief chart review Conducted a risk assessment Provided empathic reflections and validation Reviewed content from the previous session Provided positive reinforcement Employed supportive psychotherapy interventions to facilitate reduced distress and to improve coping skills with identified stressors Psychoeducation provided regarding mindfulness  DSM-5 Diagnosis(es):  F50.89 Other Specified Feeding or Eating Disorder, Emotional and Binge Eating Behaviors, F90.9 Unspecified Attention-Deficit/Hyperactivity Disorder , and F31.9 Unspecified Bipolar and Related Disorder  Treatment Goal & Progress: [name] re-established care with this provider and the following treatment goal was established: increase coping skills. [name] has demonstrated progress in her goal as evidenced by her self-report of reducing soda intake.   Plan: The next appointment is scheduled for 02/20/2023 at 11am, which will be via MyChart Video Visit. The next session will focus on working towards the established treatment goal.

## 2023-01-18 DIAGNOSIS — M25512 Pain in left shoulder: Secondary | ICD-10-CM | POA: Diagnosis not present

## 2023-01-18 DIAGNOSIS — M25511 Pain in right shoulder: Secondary | ICD-10-CM | POA: Diagnosis not present

## 2023-01-20 DIAGNOSIS — M79642 Pain in left hand: Secondary | ICD-10-CM | POA: Diagnosis not present

## 2023-01-23 DIAGNOSIS — M25511 Pain in right shoulder: Secondary | ICD-10-CM | POA: Diagnosis not present

## 2023-01-23 DIAGNOSIS — M7051 Other bursitis of knee, right knee: Secondary | ICD-10-CM | POA: Diagnosis not present

## 2023-01-23 DIAGNOSIS — M25512 Pain in left shoulder: Secondary | ICD-10-CM | POA: Diagnosis not present

## 2023-01-29 DIAGNOSIS — M79642 Pain in left hand: Secondary | ICD-10-CM | POA: Diagnosis not present

## 2023-02-09 ENCOUNTER — Encounter: Payer: Self-pay | Admitting: Adult Health

## 2023-02-12 ENCOUNTER — Ambulatory Visit (INDEPENDENT_AMBULATORY_CARE_PROVIDER_SITE_OTHER): Payer: PPO | Admitting: Physician Assistant

## 2023-02-15 ENCOUNTER — Telehealth: Payer: Self-pay

## 2023-02-15 ENCOUNTER — Other Ambulatory Visit (HOSPITAL_COMMUNITY): Payer: Self-pay

## 2023-02-15 NOTE — Telephone Encounter (Signed)
Pharmacy Patient Advocate Encounter   Received notification from RX Request Messages that prior authorization for Emgality 120MG /ML syringes (migraine) is required/requested.   Insurance verification completed.   The patient is insured through [facility] ADV[facility]AGE/RX ADVANCE .   Per test claim: PA required; PA submitted to above mentioned insurance via CoverMyMeds Key/confirmation #/EOC [id number] Status is pending

## 2023-02-16 ENCOUNTER — Encounter: Payer: Self-pay | Admitting: Genetic Counselor

## 2023-02-16 NOTE — Telephone Encounter (Signed)
Pharmacy Patient Advocate Encounter  Received notification from [facility] ADVANT[employer]/RX ADVANCE that Prior Authorization for Emgality 120MG /ML syringes (migraine) has been APPROVED from 02-15-2023 to 02-25-2024   PA #/Case ID/Reference #: [id number]

## 2023-02-20 ENCOUNTER — Telehealth (INDEPENDENT_AMBULATORY_CARE_PROVIDER_SITE_OTHER): Payer: PPO | Admitting: Psychology

## 2023-02-20 DIAGNOSIS — F909 Attention-deficit hyperactivity disorder, unspecified type: Secondary | ICD-10-CM | POA: Diagnosis not present

## 2023-02-20 DIAGNOSIS — F319 Bipolar disorder, unspecified: Secondary | ICD-10-CM | POA: Diagnosis not present

## 2023-02-20 DIAGNOSIS — F5089 Other specified eating disorder: Secondary | ICD-10-CM

## 2023-02-20 NOTE — Progress Notes (Signed)
  Office: [phone]  /  Fax: [phone]    Date: February 20, 2023  Appointment Start Time: 11:02am Duration: 27 minutes Provider: Lawerance Cruel, Psy.D. Type of Session: Individual Therapy  Location of Patient: Home (private location) Location of Provider: Provider's Home (private office) Type of Contact: Telepsychological Visit via MyChart Video Visit  Session Content: [name] is a 53 y.o. female presenting for a follow-up appointment to address the previously established treatment goal of increasing coping skills.Today's appointment was a telepsychological visit. [name] provided verbal consent for today's telepsychological appointment and she is aware she is responsible for securing confidentiality on her end of the session. Prior to proceeding with today's appointment, [name]'s physical location at the time of this appointment was obtained as well a phone number she could be reached at in the event of technical difficulties. [name] and this provider participated in today's telepsychological service.   This provider conducted a brief check-in. [name] shared, "My eating habits have been terrible." She stated she is trying to decrease soda intake. A risk assessment was completed. [name] denied experiencing suicidal, self-harm and homicidal ideation, plan, and intent since the last appointment with this provider. She continues to acknowledge understanding regarding the importance of reaching out to trusted individuals and/or emergency resources if she is unable to ensure safety. [name] of today's appointment focused on helping [name] get back on track. Psychoeducation provided regarding habit stacking. Explored current habits and habits she wants to implement as it relates to her eating. Focused on making "stacks" to help her meet her goals with the clinic (e.g., "After taking medications in the morning, I will drink [employer] milk.") Overall, [name] was receptive to today's appointment as  evidenced by openness to sharing, responsiveness to feedback, and willingness to implement discussed strategies .  Mental Status Examination:  Appearance: neat Behavior: appropriate to circumstances Mood: neutral Affect: mood congruent Speech: WNL Eye Contact: appropriate Psychomotor Activity: WNL Gait: unable to assess Thought Process: linear, logical, and goal directed and denies suicidal, homicidal, and self-harm ideation, plan and intent  Thought Content/Perception: no hallucinations, delusions, bizarre thinking or behavior endorsed or observed Orientation: AAOx4 Memory/Concentration: intact Insight: fair Judgment: fair  Interventions:  Conducted a brief chart review Conducted a risk assessment Provided empathic reflections and validation Provided positive reinforcement Employed supportive psychotherapy interventions to facilitate reduced distress and to improve coping skills with identified stressors Engaged patient in problem solving Psychoeducation provided regarding habit stacking  DSM-5 Diagnosis(es):  F50.89 Other Specified Feeding or Eating Disorder, Emotional and Binge Eating Behaviors, F90.9 Unspecified Attention-Deficit/Hyperactivity Disorder , and F31.9 Unspecified Bipolar and Related Disorder  Treatment Goal & Progress: [name] re-established care with this provider and the following treatment goal was established: increase coping skills. [name] has demonstrated progress in her goal as evidenced by her self-report of reducing soda intake.   Plan: The next appointment is scheduled for 03/13/2023 at 11am, which will be via MyChart Video Visit. The next session will focus on working towards the established treatment goal. [name] reported she has not re-initiated services with her primary therapist due to finances; however, she agreed to call today to schedule an appointment in February.    Lawerance Cruel, PsyD

## 2023-02-25 ENCOUNTER — Encounter: Payer: Self-pay | Admitting: Adult Health

## 2023-02-26 NOTE — Telephone Encounter (Signed)
If Dr. Vickey Huger has availability please schedule with her. If not let me know.

## 2023-02-28 ENCOUNTER — Other Ambulatory Visit: Payer: Self-pay | Admitting: Adult Health

## 2023-03-06 ENCOUNTER — Ambulatory Visit (INDEPENDENT_AMBULATORY_CARE_PROVIDER_SITE_OTHER): Payer: PPO | Admitting: Physician Assistant

## 2023-03-06 ENCOUNTER — Encounter (INDEPENDENT_AMBULATORY_CARE_PROVIDER_SITE_OTHER): Payer: Self-pay | Admitting: Physician Assistant

## 2023-03-06 VITALS — BP 127/82 | HR 94 | Temp 98.5°F | Ht 69.0 in | Wt 251.0 lb

## 2023-03-06 DIAGNOSIS — E7849 Other hyperlipidemia: Secondary | ICD-10-CM

## 2023-03-06 DIAGNOSIS — E559 Vitamin D deficiency, unspecified: Secondary | ICD-10-CM

## 2023-03-06 DIAGNOSIS — E88819 Insulin resistance, unspecified: Secondary | ICD-10-CM | POA: Diagnosis not present

## 2023-03-06 DIAGNOSIS — R5383 Other fatigue: Secondary | ICD-10-CM

## 2023-03-06 DIAGNOSIS — R42 Dizziness and giddiness: Secondary | ICD-10-CM

## 2023-03-06 DIAGNOSIS — G4710 Hypersomnia, unspecified: Secondary | ICD-10-CM

## 2023-03-06 DIAGNOSIS — Z6837 Body mass index (BMI) 37.0-37.9, adult: Secondary | ICD-10-CM

## 2023-03-06 NOTE — Progress Notes (Signed)
 SUBJECTIVE: Discussed the use of AI scribe software for clinical note transcription with the patient, who gave verbal consent to proceed.  Chief Complaint: Obesity  Interim History: She is down 4 lbs since her last visit.  Bio impedence scale reviewed with the patient:  Muscle mass - 0.4 lbs Adipose mass -3.2 lbs Total body water -4.2 lbs [name] is a 53 year old female who presents for follow-up of her obesity treatment plan.  She has a history of insulin  resistance, vitamin D  deficiency, hyperlipidemia, and emotional eating behaviors. She has been working on increasing her mobility but faces challenges due to knee pain.  She experiences ongoing right knee pain, particularly on the inside of the knee, which has affected her mobility. She received injections in the joint and below it.  Despite the pain, she continues to work, although she reports standing for long periods is exhausting. She has been undergoing physical therapy for her knee and arms, with significant improvement noted.   She has experienced two falls at work in the past month and reports memory issues and dizziness, which her mother has also noticed. She mentions sleeping excessively, up to 11-12 hours a night, and sometimes taking naps during the day. She is followed by neurology and has follow up scheduled with Dr. Chalice this week.   She engages in physical activity with her friend and is mindful of her eating habits, avoiding sweets like Oreos and chocolate. She is working towards losing weight and aims to return to her previous weight loss goals.    [name] is here to discuss her progress with her obesity treatment plan. She is on the Category 2 Plan and states she is following her eating plan approximately 60 % of the time. She states she is not exercising 0 minutes 0 times per week.  Fasting labs were obtained today.  She was informed we would discuss her lab results at her next visit unless there is a  critical issue that needs to be addressed sooner. She agreed to keep her next visit at the agreed upon time to discuss these results.    OBJECTIVE: Visit Diagnoses: Problem List Items Addressed This Visit     Morbid obesity (HCC)   Insulin  resistance - Primary   Relevant Orders   CMP14+EGFR   Hemoglobin A1c   Insulin , random   Other hyperlipidemia   Relevant Orders   Lipid Panel With LDL/HDL Ratio   Vitamin D  deficiency   Relevant Orders   VITAMIN D  25 Hydroxy (Vit-D Deficiency, Fractures)   BMI 37.0-37.9, adult   Other fatigue   Relevant Orders   Vitamin B12   CBC with Differential/Platelet   TSH   Other Visit Diagnoses       Dizziness         Excessive sleepiness          Obesity 53 year old female with obesity, insulin  resistance, vitamin D  deficiency, hyperlipidemia, and emotional eating behaviors. Working on mobility and dietary habits. Primary barrier to weight loss is knee pain limiting mobility. - Encourage mindful eating and physical activity - Follow up in four weeks to assess progress   Insulin  Resistance Last fasting insulin  was 7.0- nearing goal. A1c was 5.3- at goal. Polyphagia:No Medication(s): None Lab Results  Component Value Date   HGBA1C 5.3 03/14/2022   HGBA1C 5.2 07/06/2021   HGBA1C 5.1 02/15/2021   HGBA1C 5.1 12/29/2016   HGBA1C 5.1 07/19/2016   Lab Results  Component Value Date   INSULIN   7.0 03/14/2022   INSULIN  6.0 07/06/2021   INSULIN  9.3 02/15/2021    Plan:  Continue working on nutrition plan to decrease simple carbohydrates, increase lean proteins and exercise to promote weight loss, improve glycemic control and prevent progression to Type 2 diabetes.    Vitamin D  Deficiency Vitamin D  is at goal of 50.  Most recent vitamin D  level was 66.8. She is on OTC vitamin D3 5000 IU daily. No N/V or muscle weakness with OTC vitamin D .  Lab Results  Component Value Date   VD25OH 66.8 12/20/2021   VD25OH 74.2 07/06/2021   VD25OH 49.4  02/15/2021    Plan: Continue OTC vitamin D3 5000 IU daily Low vitamin D  levels can be associated with adiposity and may result in leptin resistance and weight gain. Also associated with fatigue. Currently on vitamin D  supplementation without any adverse effects.  Recheck vitamin D  level to optimize supplementation/avoid over supplementation.   Hyperlipidemia LDL is not at goal. Medication(s): None Cardiovascular risk factors: dyslipidemia, obesity (BMI >= 30 kg/m2), and sedentary lifestyle  Lab Results  Component Value Date   CHOL 198 03/14/2022   HDL 60 03/14/2022   LDLCALC 122 (H) 03/14/2022   TRIG 90 03/14/2022   CHOLHDL 3.4 07/06/2021   CHOLHDL 3.7 12/29/2016   CHOLHDL 3.4 07/19/2016   Lab Results  Component Value Date   ALT 18 08/17/2021   AST 18 08/17/2021   ALKPHOS 64 08/17/2021   BILITOT 0.5 08/17/2021   The 10-year ASCVD risk score (Arnett DK, et al., 2019) is: 2%   Values used to calculate the score:     Age: 19 years     Sex: Female     Is Non-Hispanic African American: No     Diabetic: No     Tobacco smoker: No     Systolic Blood Pressure: 127 mmHg     Is BP treated: Yes     HDL Cholesterol: 60 mg/dL     Total Cholesterol: 198 mg/dL  Plan: Continue to work on engineer, technical sales -decreasing simple carbohydrates, increasing lean proteins, decreasing saturated fats and cholesterol , avoiding trans fats and exercise as able to promote weight loss, improve lipids and decrease cardiovascular risks. Recheck lipid panel today.    Dizziness and Falls Recent episodes of dizziness and falls at work, along with reported memory issues. Potential causes include medication side effects or other neurological issues. She has an upcoming appointment with her neurologist, Dr. Dedra Dohmeier who is familiar with her medical history. - Attend neurology appointment with Dr. Chalice.  Will recheck labs today as well.   Excessive Sleep Reports sleeping 11-12 hours per night and  taking additional naps during the day. This could be related to her neurological issues or other underlying conditions. Advised to discuss with Dr. Chalice - Check TSH levels, B 12 and vitamin D  levels today - Discuss sleep patterns with Dr. Chalice  Knee Pain Chronic knee pain, particularly on the medial side, not involving the ACL. Pain exacerbated by weather changes and prolonged standing. Physical therapy for four months has improved her condition. Continue regular follow up with orthopedics.    General Health Maintenance Routine follow-up labs are due to monitor overall health status, including insulin  resistance, vitamin D  levels, and hyperlipidemia. She has stopped taking B12 as advised. - Order B12, CBC, LFTs, kidney function tests, electrolytes, A1c, insulin  level, lipid panel, TSH, and vitamin D  levels - Review lab results and discuss any abnormalities at the next appointment  Follow-up -  Follow up in four weeks on Tuesday, March 4th at 2:30 PM - Contact if any lab results are significantly abnormal.  Vitals Temp: 98.5 F (36.9 C) BP: 127/82 Pulse Rate: 94 SpO2: 97 %   Anthropometric Measurements Height: 5' 9 (1.753 m) Weight: 251 lb (113.9 kg) BMI (Calculated): 37.05 Weight at Last Visit: 255 lb Weight Lost Since Last Visit: 4 lb Weight Gained Since Last Visit: 0 Starting Weight: 270 lb Total Weight Loss (lbs): 19 lb (8.618 kg) Peak Weight: 308 lb   Body Composition  Body Fat %: 49.3 % Fat Mass (lbs): 124.2 lbs Muscle Mass (lbs): 121.2 lbs Total Body Water (lbs): 91 lbs Visceral Fat Rating : 14   Other Clinical Data Fasting: yes Labs: yes Today's Visit #: 30 Starting Date: 02/15/21     ASSESSMENT AND PLAN:  Diet: Taraya is currently in the action stage of change. As such, her goal is to continue with weight loss efforts. She has agreed to Category 2 Plan.  Exercise: Amaryllis has been instructed  exercise as able with limitations with knee pain   for weight loss and overall health benefits.   Behavior Modification:  We discussed the following Behavioral Modification Strategies today: increasing lean protein intake, decreasing simple carbohydrates, increasing vegetables, increase H2O intake, increase high fiber foods, no skipping meals, emotional eating strategies , avoiding temptations, and planning for success. We discussed various medication options to help Sorina with her weight loss efforts and we both agreed to continue to work on nutritional and behavioral strategies to promote weight loss.  .  Return in about 4 weeks (around 04/03/2023).[name] She was informed of the importance of frequent follow up visits to maximize her success with intensive lifestyle modifications for her multiple health conditions.  Attestation Statements:   Reviewed by clinician on day of visit: allergies, medications, problem list, medical history, surgical history, family history, social history, and previous encounter notes.   Time spent on visit including pre-visit chart review and post-visit care and charting was 42 minutes.    Khalilah Hoke, PA-C

## 2023-03-07 LAB — CMP14+EGFR
ALT: 12 [IU]/L (ref 0–32)
AST: 17 [IU]/L (ref 0–40)
Albumin: 4 g/dL (ref 3.8–4.9)
Alkaline Phosphatase: 80 [IU]/L (ref 44–121)
BUN/Creatinine Ratio: 11 (ref 9–23)
BUN: 10 mg/dL (ref 6–24)
Bilirubin Total: 0.4 mg/dL (ref 0.0–1.2)
CO2: 23 mmol/L (ref 20–29)
Calcium: 9.6 mg/dL (ref 8.7–10.2)
Chloride: 103 mmol/L (ref 96–106)
Creatinine, Ser: 0.94 mg/dL (ref 0.57–1.00)
Globulin, Total: 1.9 g/dL (ref 1.5–4.5)
Glucose: 83 mg/dL (ref 70–99)
Potassium: 4.2 mmol/L (ref 3.5–5.2)
Sodium: 141 mmol/L (ref 134–144)
Total Protein: 5.9 g/dL — ABNORMAL LOW (ref 6.0–8.5)
eGFR: 73 mL/min/{1.73_m2} (ref >59)

## 2023-03-07 LAB — CBC WITH DIFFERENTIAL/PLATELET
Basophils Absolute: 0.1 10*3/uL (ref 0.0–0.2)
Basos: 1 %
EOS (ABSOLUTE): 0.1 10*3/uL (ref 0.0–0.4)
Eos: 1 %
Hematocrit: 44.6 % (ref 34.0–46.6)
Hemoglobin: 14.8 g/dL (ref 11.1–15.9)
Immature Grans (Abs): 0 10*3/uL (ref 0.0–0.1)
Immature Granulocytes: 0 %
Lymphocytes Absolute: 3 10*3/uL (ref 0.7–3.1)
Lymphs: 50 %
MCH: 30.4 pg (ref 26.6–33.0)
MCHC: 33.2 g/dL (ref 31.5–35.7)
MCV: 92 fL (ref 79–97)
Monocytes Absolute: 0.5 10*3/uL (ref 0.1–0.9)
Monocytes: 9 %
Neutrophils Absolute: 2.4 10*3/uL (ref 1.4–7.0)
Neutrophils: 39 %
Platelets: 251 10*3/uL (ref 150–450)
RBC: 4.87 x10E6/uL (ref 3.77–5.28)
RDW: 12.4 % (ref 11.7–15.4)
WBC: 6 10*3/uL (ref 3.4–10.8)

## 2023-03-07 LAB — INSULIN, RANDOM: INSULIN: 7.3 u[IU]/mL (ref 2.6–24.9)

## 2023-03-07 LAB — LIPID PANEL WITH LDL/HDL RATIO
Cholesterol, Total: 221 mg/dL — ABNORMAL HIGH (ref 100–199)
HDL: 60 mg/dL (ref >39)
LDL Chol Calc (NIH): 143 mg/dL — ABNORMAL HIGH (ref 0–99)
LDL/HDL Ratio: 2.4 {ratio} (ref 0.0–3.2)
Triglycerides: 99 mg/dL (ref 0–149)
VLDL Cholesterol Cal: 18 mg/dL (ref 5–40)

## 2023-03-07 LAB — VITAMIN B12: Vitamin B-12: 1109 pg/mL (ref 232–1245)

## 2023-03-07 LAB — HEMOGLOBIN A1C
Est. average glucose Bld gHb Est-mCnc: 105 mg/dL
Hgb A1c MFr Bld: 5.3 % (ref 4.8–5.6)

## 2023-03-07 LAB — VITAMIN D 25 HYDROXY (VIT D DEFICIENCY, FRACTURES): Vit D, 25-Hydroxy: 53.2 ng/mL (ref 30.0–100.0)

## 2023-03-07 LAB — TSH: TSH: 2.53 u[IU]/mL (ref 0.450–4.500)

## 2023-03-08 ENCOUNTER — Ambulatory Visit: Payer: PPO | Admitting: Neurology

## 2023-03-08 ENCOUNTER — Encounter: Payer: Self-pay | Admitting: Neurology

## 2023-03-08 VITALS — Ht 69.0 in | Wt 255.0 lb

## 2023-03-08 DIAGNOSIS — H53143 Visual discomfort, bilateral: Secondary | ICD-10-CM

## 2023-03-08 DIAGNOSIS — Z79899 Other long term (current) drug therapy: Secondary | ICD-10-CM

## 2023-03-08 DIAGNOSIS — R296 Repeated falls: Secondary | ICD-10-CM

## 2023-03-08 DIAGNOSIS — Z7409 Other reduced mobility: Secondary | ICD-10-CM

## 2023-03-08 DIAGNOSIS — F900 Attention-deficit hyperactivity disorder, predominantly inattentive type: Secondary | ICD-10-CM

## 2023-03-08 DIAGNOSIS — F603 Borderline personality disorder: Secondary | ICD-10-CM

## 2023-03-08 DIAGNOSIS — H8111 Benign paroxysmal vertigo, right ear: Secondary | ICD-10-CM

## 2023-03-08 NOTE — Progress Notes (Addendum)
 Provider:  Dedra Gores, MD  Primary Care Physician:  Waddell Parkins, NP          Chief Complaint according to patient   Patient presents with:                HISTORY OF PRESENT ILLNESS:  [name] is a 53 y.o. female patient who is here for revisit 03/08/2023 for subjective .memory concerns.   History of Migraine,Bipolar disorder ,  ACL injuries,  Obesity.    Chief concern according to patient :  Migraine responds to Emgality  and Nurtec, and  no longer using Botox  Dizziness, she has fallen twice .  Her memory is 'off she reports, her mother is leaning into her to get it looked after.  The situation at home is also not easy, mother is dependent on her due to limited mobility.   I reviewed briefly the patient's medications today and they are all familiar from previous visits she is still taking Latuda , she occasionally needs Phenergan  when migraine causes her nausea and vomiting, she has been on Nurtec 75 mg which can be taken at the onset of migraine she sleeps better on trazodone  100 mg at night she also takes zinc, verapamil , metoprolol  and Vistaril , she has remained on Depakote  ER which is a migraine preventative as well as a mood stabilizer and 500 mg 2 tablets at month by mouth once at night.  Emgality  is injected into the skin subcutaneously every 30 days, Zyrtec  is used aside from any seasonal allergy  and the daily level, Adderall XR 20 mg daily to help with attention focus and fatigue.  She does take vitamin D  supplements and vitamin C supplements.  My assistant today tested the patient via [place] cognitive assessment and she did excellent:  there were 26 out of 30 points which today and there is no evidence of short term memory loss limited visual-spatial capacity or attention deficit.  On the contrary she was very well equipped to do multistep commands.  It is also remarkable that 4 out of 5 words for recall and this speaks for normal short-term memory.   Fully oriented.  We will concentrate on dizziness now.        12/26/22   [name] is a 53 y.o. female who has been followed in this office for Migraines. Returns today for follow-up. Reports Migraines have been stable. Maybe 1 headache a week. Not always severe. Uses OTC tyenol.  In the past she has gotten an aura with her migraines she states that that has become less frequent.  Maybe only twice a month.  She returns today for follow-up.   05/26/22:   [name] is a 53 y.o. female with a history of Migraine headaches. Returns today for follow-up. Reports migraines are better. Only having a migraine every 1.5 weeks. Continues on Emgality , Depakote  and verapamil . In March she tore her right ACL and has a roatator tear left. Has been out of work since March.      [name] is a 53 y.o. female with a history of migraine headaches, falls and postconcussion syndrome. Returns today for follow-up.  Reports that she had a change in the type of migraine she is having after she had the fall back in January.  CT scan of the brain was unremarkable.  She describes the Migraine as instant sharp shooting pain whereas before she was having an aura.  The intensity of  the headache may improve but the headache stays.  Still on the left side. Gets both photophobia, phonophobia. Gets them every 2-3 days. She did go to urgent care to get migraine cocktail and reports that that worked well.  Took Emgality  last in December.  She has not taken Emgality  in January or February.  But does report that in the past Emgality  seems to work well for her migraines.      02/09/22: [name] is a 53 year old female with a history of migraine headaches.  She returns today for follow-up.  She had a fall while getting out of bed this week.  She did see her PCP.  They took her out of work until she can see us . Was given a Toradol  shot. Was given a diagnosed of possible concussion. Reports since fall- feels pressure  on the left side. Dizziness. Reports headache pain has not gotten any better. Reports that she did hit her head on the dresser when she fell. Has a pending xray on the left hand currently wearing a brace.      08/29/21: [name] is a 53 year old female with a history of migraine headache with left side facial tingling and in the left arm. Went to ED for headache. She was discharged home on migraine cocktail. Benadryl , compazine , tylenol  and Ibuprofen  to take for three days when home. She states that the headache is better but still there. On a scale of 1-10 headache is 7/10. Reports that she is still having a tingling sensation in left side of face and arm.  She has not taking Emgality  she was due 6 to 7 days ago.  Emgality  has been controlling her headaches.  Has not had to use Imitrex  in several months.  She does see a spine specialist who she states could do epidural injections but wanted to see us  first.   MRI cervical spine: IMPRESSION: 1. Motion degraded examination. 2. Unchanged cervical disc degeneration. 3. Mild spinal stenosis at C6-7. 4. Moderate left neural foraminal stenosis at C3-4 and mild left foraminal stenosis at C4-5. 5. Normal appearance of the cervical spinal cord.   MRI brain w/wo contrast: IMPRESSION: No acute intracranial process. No evidence of acute or subacute infarct.  07/21/19: [name] is a 53 year old female with a history of postconcussive syndrome and migraine headaches.  She returns today for follow-up.  She states that she has been seeing speech therapy and feels that it has helped some.  She reports her therapist has mentioned additional testing with neuropsychology.  Patient states that she works at [employer].  States that she had a customer recently who was on the phone while checking out.  She states that she became very overwhelmed over this incident.  She states that she also has occasions where she forgets what she goes into a room for.  Often she does  remember it later.  She states that she often loses her train of thought in mid sentence.  Or may not be able to find the right word to say.  She also states when she is working if a customer gives her her email address she has a hard time processing it.  She was on Adderall when she was in school.  She states that her psychiatrist stopped this when school ended.  She is not been on this medication for quite some time.  She returns today for an evaluation.   02/17/19:   [name] is a 53 year old female with a history of postconcussive  syndrome and migraine headaches.  She returns today for follow-up.  The patient reports that Toradol  injection resolved her headache.  She has not had a headache since then.  She continues on Depakote  and Emgality .  Her next injection is February 10 for Emgality .  She continues to have chronic dizziness.  She also reports some memory issues but is unsure if it is just brain fog she states on occasion she will also drop things but this has been ongoing.  The patient had a CT of the head back in 2019 after hitting her head that was unremarkable.     Review of Systems: Out of a complete 14 system review, the patient complains of only the following symptoms, and all other reviewed systems are negative.:    Hypersomnia  I can sleep 12 hours a day   My assistant today tested the patient via [place] cognitive assessment and she did excellent:  there were 26 out of 30 points which today and there is no evidence of short term memory loss limited visual-spatial capacity or attention deficit.  On the contrary she was very well equipped to do multistep commands.  It is also remarkable that 4 out of 5 words for recall and this speaks for normal short-term memory.  Fully oriented.  We will concentrate on dizziness now.   Social History   Socioeconomic History   Marital status: Single    Spouse name: Not on file   Number of children: 0   Years of education: 16   Highest  education level: Bachelor's degree (e.g., BA, AB, BS)  Occupational History   Occupation: Training And Development Officer: [employer]  Tobacco Use   Smoking status: Never   Smokeless tobacco: Never  Vaping Use   Vaping status: Never Used  Substance and Sexual Activity   Alcohol use: No    Alcohol/week: 0.0 standard drinks of alcohol   Drug use: No   Sexual activity: Never    Birth control/protection: None  Other Topics Concern   Not on file  Social History Narrative   Caffeine  2 sodas daily, 1 cup coffee daily.   Pt lives with mom   Pt works    Social Drivers of Community Education Officer: Not on file  Food Insecurity: Not on file  Transportation Needs: Not on file  Physical Activity: Not on file  Stress: Not on file  Social Connections: Not on file    Family History  Problem Relation Age of Onset   Cancer Mother    Kidney disease Mother    Hypertension Mother    Hyperlipidemia Mother    Breast cancer Mother 65       genetic testing- CHEK2 likely pathogenic variant   Endometrial cancer Mother    Obesity Father    Anxiety disorder Father    Depression Father    Heart attack Father    Heart disease Father    Hypertension Father    Bipolar disorder Father    Heart disease Maternal Aunt    Breast cancer Maternal Aunt 38   Breast cancer Maternal Aunt 51   Breast cancer Maternal Aunt 35   Breast cancer Maternal Aunt    Colon cancer Maternal Uncle 55   Alzheimer's disease Maternal Uncle    Dementia Paternal Aunt    Heart disease Maternal Grandmother    Colon cancer Maternal Grandmother 66   Lymphoma Maternal Grandfather 61   Diabetes Paternal Grandfather    Bone cancer  Cousin 45   Cervical cancer Cousin        Genetic testing- 'was positive for a gene' had a mastectomy   Kidney disease Other    Depression Other    Anxiety disorder Other    Bipolar disorder Other    Obesity Other    Migraines Neg Hx     Past Medical History:  Diagnosis Date   Achilles  tendinitis    ADHD (attention deficit hyperactivity disorder), inattentive type    Diagnosed as an adult after starting college   Allergy     Arthritis    Arthritis    Ataxia    Back pain    Bipolar I disorder 04/18/2014   most recent episode mixed   Borderline personality disorder 10/27/2011   Cataracts, bilateral    Chest pain    Chronic pain disorder    due to several injuries affecting numerous areas of her body throughout the years   Chronic paroxysmal hemicrania, 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   rt chest wall nodule removal   GANGLION CYST EXCISION  2011   lipoma removal     right bunioectomy     SHOULDER SURGERY  01/13/2011   right, partial tear   tumor resection left thigh       Current Outpatient Medications on File Prior to Visit  Medication Sig Dispense Refill   ALFALFA PO Take 500 mg by mouth daily.     amphetamine -dextroamphetamine  (ADDERALL XR) 20 MG 24 hr capsule Take 20 mg by mouth daily.     Ascorbic Acid (VITAMIN C) 1000  MG tablet Take 1,000 mg by mouth daily.     calcium  carbonate (OSCAL) 1500 (600 Ca) MG TABS tablet Take 1,500 mg by mouth in the morning and at bedtime.     cetirizine  (ZYRTEC ) 10 MG tablet Take 10 mg by mouth daily.     Cholecalciferol (D3 5000) 125 MCG (5000 UT) capsule Take 1 capsule (5,000 Units total) by mouth daily.     divalproex  (DEPAKOTE  ER) 500 MG 24 hr tablet TAKE 2 TABLETS BY MOUTH ONCE DAILY AT NIGHT 180 tablet 0   docusate sodium  (COLACE) 100 MG capsule Take 1 capsule (100 mg total) by mouth 2 (two) times daily. 60 capsule 0   EMGALITY  120 MG/ML SOSY INJECT 120 MG INTO THE SKIN EVERY 30 DAYS 1 mL 1  hydrOXYzine  (VISTARIL ) 50 MG capsule Take 50 mg by mouth in the morning and at bedtime.   2   lamoTRIgine  (LAMICTAL ) 150 MG tablet Take 300 mg by mouth at bedtime.  1   LATUDA  20 MG TABS tablet Take 20 mg by mouth every morning.     lurasidone  (LATUDA ) 80 MG TABS tablet Take 1 tablet (80 mg total) by mouth daily with supper. (Along with 20mg  in AM for mood) 30 tablet 0   metoprolol  succinate (TOPROL -XL) 100 MG 24 hr tablet Take 1 tablet (100 mg total) by mouth daily. Take with or immediately following a meal. 30 tablet 0   Multiple Vitamin (MULTIVITAMIN WITH MINERALS) TABS tablet Take 1 tablet by mouth daily.     mupirocin ointment (BACTROBAN) 2 % Apply 1 Application topically as needed (irritation).     Omega-3 Fatty Acids (OMEGA 3 PO) Take 1 capsule by mouth daily.     promethazine  (PHENERGAN ) 25 MG tablet Take 1 tablet (25 mg total) by mouth 2 (two) times daily as needed for nausea or vomiting. 30 tablet 1   Rimegepant Sulfate (NURTEC) 75 MG TBDP Take 1 tablet at the onset of migraine. Only 1 tablet in 24 hours. 9 tablet 11   traZODone  (DESYREL ) 100 MG tablet Take 1 tablet (100 mg total) by mouth at bedtime. 30 tablet 0   verapamil  (CALAN -SR) 120 MG CR tablet TAKE 1 TABLET(120 MG) BY MOUTH AT BEDTIME 90 tablet 1   Zinc 50 MG TABS Take 50 mg by mouth daily.     No current  facility-administered medications on file prior to visit.    Allergies  Allergen Reactions   Other Itching    Steri-strip also hand rash and redness.   Adhesive [Tape] Itching and Rash    Also reacted to Steri Strips and Band-Aids.   Amoxicillin Hives   Hydromorphone Hcl Other (See Comments) and Itching   Medroxyprogesterone Acetate Other (See Comments)   Morphine  Nausea And Vomiting   Oxycodone -Acetaminophen  Other (See Comments) and Itching   Penicillins Hives    Has patient had a PCN reaction causing immediate rash, facial/tongue/throat swelling, SOB or lightheadedness with hypotension: YES  Has patient had a PCN reaction causing severe rash involving mucus membranes or skin necrosis: NO  Has patient had a PCN reaction that required hospitalization NO  Has patient had a PCN reaction occurring within the last 10 years:NO  If all of the above answers are NO, then may proceed with Cephalosporin use.   Percocet [Oxycodone -Acetaminophen ] Itching   Prednisone  Other (See Comments)    Pt reports Prednisone  induces Manic episodes   Provera [Medroxyprogesterone Acetate] Other (See Comments)    Causes manic episodes   Tramadol  Hcl Other (See Comments) and Itching     DIAGNOSTIC DATA (LABS, IMAGING, TESTING) - I reviewed patient records, labs, notes, testing and imaging myself where available.  Lab Results  Component Value Date   WBC 6.0 03/06/2023   HGB 14.8 03/06/2023   HCT 44.6 03/06/2023   MCV 92 03/06/2023   PLT 251 03/06/2023      Component Value Date/Time   NA 141 03/06/2023 1234   K 4.2 03/06/2023 1234   CL 103 03/06/2023 1234   CO2 23 03/06/2023 1234   GLUCOSE 83 03/06/2023 1234   GLUCOSE 101 (H) 08/17/2021 1526   BUN 10 03/06/2023 1234   CREATININE 0.94 03/06/2023 1234   CREATININE 0.83 09/11/2011 1524   CALCIUM  9.6 03/06/2023 1234   PROT 5.9 (L) 03/06/2023 1234  ALBUMIN 4.0 03/06/2023 1234   AST 17 03/06/2023 1234   ALT 12 03/06/2023 1234   ALKPHOS 80  03/06/2023 1234   BILITOT 0.4 03/06/2023 1234   GFRNONAA >60 08/18/2021 1755   GFRAA 69 01/15/2020 0948   Lab Results  Component Value Date   CHOL 221 (H) 03/06/2023   HDL 60 03/06/2023   LDLCALC 143 (H) 03/06/2023   TRIG 99 03/06/2023   CHOLHDL 3.4 07/06/2021   Lab Results  Component Value Date   HGBA1C 5.3 03/06/2023   Lab Results  Component Value Date   VITAMINB12 1,109 03/06/2023   Lab Results  Component Value Date   TSH 2.530 03/06/2023    PHYSICAL EXAM:  Today's Vitals   03/08/23 1320  Weight: 255 lb (115.7 kg)  Height: 5' 9 (1.753 m)   Body mass index is 37.66 kg/m.   Wt Readings from Last 3 Encounters:  03/08/23 255 lb (115.7 kg)  03/06/23 251 lb (113.9 kg)  01/15/23 255 lb (115.7 kg)     Ht Readings from Last 3 Encounters:  03/08/23 5' 9 (1.753 m)  03/06/23 5' 9 (1.753 m)  01/15/23 5' 9 (1.753 m)      General: The patient is awake, alert and appears not in acute distress. The patient is well groomed. Head: Normocephalic, atraumatic.   Neck is supple.  Cardiovascular:  Regular rate and cardiac rhythm by pulse,  without distended neck veins. Respiratory: Lungs are clear to auscultation.  Skin:  Without evidence of ankle edema, or rash. Trunk: The patient's posture is erect.   NEUROLOGIC EXAM: The patient is awake and alert, oriented to place and time.   Memory subjective described as intact.  Attention span & concentration ability appears normal.  Speech is fluent,  without dysarthria, dysphonia or aphasia.  Mood and affect :  as usual    Cranial nerves: no loss of smell or taste reported  Pupils are equal and briskly reactive to light. Funduscopic exam deferred. .   Extraocular movements in vertical and horizontal planes were intact and without nystagmus. No Diplopia. Visual fields by finger perimetry are intact. Hearing was intact to soft voice and finger rubbing.     Facial sensation intact to fine touch.  Facial motor strength is  symmetric and tongue and uvula move midline.  Neck ROM : rotation, tilt and flexion extension were normal for age and shoulder shrug was symmetrical.    Motor exam:  Symmetric bulk, tone and ROM.   Normal tone without cog wheeling, symmetric grip strength .   Sensory:  Fine touch, pinprick and vibration were tested  and  normal.  Proprioception tested in the upper extremities was normal.   Coordination: Rapid alternating movements in the fingers/hands were of normal speed.  The Finger-to-nose maneuver was intact without evidence of ataxia, dysmetria , there is tremor with extended arms and with action, not at rest.    Gait and station: Patient could rise unassisted from a seated position, walked without assistive device.  Rising from the chair, there was no lightheadedness- and turned fluently - no vertigo. She later reported feeling unsteady.  Stance is of wider width/ base and the patient turned with 3.5 steps.  Her left foot pointed outwards, but this is not the injured knee ( that's on the right )  arm swing.  Toe and heel walk were deferred.  Deep tendon reflexes: in the  upper and lower extremities are symmetric and intact.  Babinski response was deferred .  ASSESSMENT AND PLAN 53 y.o. year old female  here with:  New concern Memory :  My assistant today tested the patient via [place] cognitive assessment and she did excellent:  there were 26 out of 30 points which today and there is no evidence of short term memory loss , limited visual-spatial capacity - may be more of an attention deficit.  On the contrary she was very well equipped to do multistep commands.  It is also remarkable that 4 out of 5 words for recall and this speaks for normal short-term memory.   Fully oriented.  No need to intervene .     Dizziness: We will concentrate on dizziness now.  ORTHOSTATICS normal.  The gait was not ataxic but she reports having fallen backwards.  Not when just getting up-  she  takes time to get a out of bed due to knee pain-  its not buckling joints,there is no LOC,  she is well hydrated.  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  She had just turned to the right to place the items in a basket- this move was the trigger. And another time , the same scenario. 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 : gave instruction on Eppley maneuvers, if not successful consider vestibular rehab. .     Migraine : needs Nurtec refill . She has been so long on Depakote  , I need to screen for med induced  osteopenia or osteoporosis.  Dexa scan  ordered.   I plan to follow up prn through our NP within 12 months.   After spending a total time of  35  minutes face to face and additional time for physical and neurologic examination, review of laboratory studies,  personal review of imaging studies, reports and results of other testing and review of referral information / records as far as provided in visit,   Electronically signed by: Dedra Gores, MD 03/08/2023 1:47 PM  [facility] Board certified by The [employer] of Sleep Medicine and Diplomate of the [employer] of [employer]. Board certified In Neurology through the ABPN, Fellow of the [employer] of [employer].

## 2023-03-08 NOTE — Patient Instructions (Signed)
 Vertigo Vertigo is the feeling that you or the things around you are moving or spinning when they're not. It's different than feeling dizzy. It can also cause: Loss of balance. Trouble standing or walking. Nausea and vomiting. This feeling can come and go at any time. It can last from a few seconds to minutes or even hours. It may go away on its own or be treated with medicine. What are the types of vertigo? There are two types of vertigo: Peripheral vertigo happens when parts of your inner ear don't work like they should. This is the more common type. Central vertigo happens when your brain and spinal cord don't work like they should. Your health care provider will do tests to find out what kind of vertigo you have. This will help them decide on the right treatment for you. Follow these instructions at home: Eating and drinking Drink enough fluid to keep your pee (urine) pale yellow. Do not drink alcohol. Activity When you get up in the morning, first sit up on the side of the bed. When you feel okay, stand slowly while holding onto something. Move slowly. Avoid sudden body or head movements. Avoid certain positions, as told by your provider. Use a cane if you have trouble standing or walking. Sit down right away if you feel unsteady. Place items in your home so they're easy for you to reach without bending or leaning over. Return to normal activities when you're told. Ask what things are safe for you to do. General instructions Take your medicines only as told by your provider. Contact a health care provider if: Your medicines don't help or make your vertigo worse. You get new symptoms. You have a fever. You have nausea or vomiting. Your family or friends spot any changes in how you're acting. A part of your body goes numb. You feel tingling and prickling in a part of your body. You get very bad headaches. Get help right away if: You're always dizzy or you faint. You have a  stiff neck. You have trouble moving or speaking. Your hands, arms, or legs feel weak. Your hearing or eyesight changes. These symptoms may be an emergency. Call 911 right away. Do not wait to see if the symptoms will go away. Do not drive yourself to the hospital. This information is not intended to replace advice given to you by your health care provider. Make sure you discuss any questions you have with your health care provider. Document Revised: 10/19/2022 Document Reviewed: 04/21/2022 Elsevier Patient Education  2024 [employer]  How to Perform the Epley Maneuver The Epley maneuver is an exercise that relieves symptoms of vertigo. Vertigo is the feeling that you or your surroundings are moving when they are not. When you feel vertigo, you may feel like the room is spinning and may have trouble walking. The Epley maneuver is used for a type of vertigo caused by a calcium  deposit in a part of the inner ear. The maneuver involves changing head positions to help the deposit move out of the area. You can do this maneuver at home whenever you have symptoms of vertigo. You can repeat it in 24 hours if your vertigo has not gone away. Even though the Epley maneuver may relieve your vertigo for a few weeks, it is possible that your symptoms will return. This maneuver relieves vertigo, but it does not relieve dizziness. What are the risks? If it is done correctly, the Epley maneuver is considered safe. Sometimes it  can lead to dizziness or nausea that goes away after a short time. If you develop other symptoms--such as changes in vision, weakness, or numbness--stop doing the maneuver and call your health care provider. Supplies needed: A bed or table. A pillow. How to do the Epley maneuver     Sit on the edge of a bed or table with your back straight and your legs extended or hanging over the edge of the bed or table. Turn your head halfway toward the affected ear or side as told by your health  care provider. Lie backward quickly with your head turned until you are lying flat on your back. Your head should dangle (head-hanging position). You may want to position a pillow under your shoulders. Hold this position for at least 30 seconds. If you feel dizzy or have symptoms of vertigo, continue to hold the position until the symptoms stop. Turn your head to the opposite direction until your unaffected ear is facing down. Your head should continue to dangle. Hold this position for at least 30 seconds. If you feel dizzy or have symptoms of vertigo, continue to hold the position until the symptoms stop. Turn your whole body to the same side as your head so that you are positioned on your side. Your head will now be nearly facedown and no longer needs to dangle. Hold for at least 30 seconds. If you feel dizzy or have symptoms of vertigo, continue to hold the position until the symptoms stop. Sit back up. You can repeat the maneuver in 24 hours if your vertigo does not go away. Follow these instructions at home: For 24 hours after doing the Epley maneuver: Keep your head in an upright position. When lying down to sleep or rest, keep your head raised (elevated) with two or more pillows. Avoid excessive neck movements. Activity Do not drive or use machinery if you feel dizzy. After doing the Epley maneuver, return to your normal activities as told by your health care provider. Ask your health care provider what activities are safe for you. General instructions Drink enough fluid to keep your urine pale yellow. Do not drink alcohol. Take over-the-counter and prescription medicines only as told by your health care provider. Keep all follow-up visits. This is important. Preventing vertigo symptoms Ask your health care provider if there is anything you should do at home to prevent vertigo. He or she may recommend that you: Keep your head elevated with two or more pillows while you sleep. Do not  sleep on the side of your affected ear. Get up slowly from bed. Avoid sudden movements during the day. Avoid extreme head positions or movement, such as looking up or bending over. Contact a health care provider if: Your vertigo gets worse. You have other symptoms, including: Nausea. Vomiting. Headache. Get help right away if you: Have vision changes. Have a headache or neck pain that is severe or getting worse. Cannot stop vomiting. Have new numbness or weakness in any part of your body. These symptoms may represent a serious problem that is an emergency. Do not wait to see if the symptoms will go away. Get medical help right away. Call your local emergency services (911 in the [place] Do not drive yourself to the hospital. Summary Vertigo is the feeling that you or your surroundings are moving when they are not. The Epley maneuver is an exercise that relieves symptoms of vertigo. If the Epley maneuver is done correctly, it is considered safe. This information is  not intended to replace advice given to you by your health care provider. Make sure you discuss any questions you have with your health care provider. Document Revised: 10/13/2022 Document Reviewed: 10/13/2022 Elsevier Patient Education  2024 [employer].

## 2023-03-12 ENCOUNTER — Encounter: Payer: Self-pay | Admitting: Neurology

## 2023-03-13 ENCOUNTER — Other Ambulatory Visit: Payer: Self-pay | Admitting: Neurology

## 2023-03-13 ENCOUNTER — Telehealth (INDEPENDENT_AMBULATORY_CARE_PROVIDER_SITE_OTHER): Payer: PPO | Admitting: Psychology

## 2023-03-13 DIAGNOSIS — F5089 Other specified eating disorder: Secondary | ICD-10-CM | POA: Diagnosis not present

## 2023-03-13 DIAGNOSIS — Z79899 Other long term (current) drug therapy: Secondary | ICD-10-CM

## 2023-03-13 DIAGNOSIS — G43109 Migraine with aura, not intractable, without status migrainosus: Secondary | ICD-10-CM

## 2023-03-13 DIAGNOSIS — F319 Bipolar disorder, unspecified: Secondary | ICD-10-CM | POA: Diagnosis not present

## 2023-03-13 DIAGNOSIS — H8111 Benign paroxysmal vertigo, right ear: Secondary | ICD-10-CM

## 2023-03-13 DIAGNOSIS — F909 Attention-deficit hyperactivity disorder, unspecified type: Secondary | ICD-10-CM

## 2023-03-13 DIAGNOSIS — R42 Dizziness and giddiness: Secondary | ICD-10-CM

## 2023-03-13 NOTE — Progress Notes (Signed)
  Office: [phone]  /  Fax: [phone]    Date: March 13, 2023  Appointment Start Time: 11:00am Duration: 35 minutes Provider: Lawerance Cruel, Psy.D. Type of Session: Individual Therapy  Location of Patient: Home (private location) Location of Provider: Provider's Home (private office) Type of Contact: Telepsychological Visit via MyChart Video Visit  Session Content: [name] is a 53 y.o. female presenting for a follow-up appointment to address the previously established treatment goal of increasing coping skills. Today's appointment was a telepsychological visit. [name] provided verbal consent for today's telepsychological appointment and she is aware she is responsible for securing confidentiality on her end of the session. Prior to proceeding with today's appointment, [name]'s physical location at the time of this appointment was obtained as well a phone number she could be reached at in the event of technical difficulties. Chrisette and this provider participated in today's telepsychological service.   This provider conducted a brief check-in. Lamia reported she lost four pounds, noting a reduction in soda intake. A risk assessment was completed. Mayukha denied experiencing suicidal, self-harm and homicidal ideation, plan, and intent since the last appointment with this provider. She continues to acknowledge understanding regarding the importance of reaching out to trusted individuals and/or emergency resources if she is unable to ensure safety. Regarding eating habits, she stated she continues to have a challenge consuming protein as she does not enjoy meat. It was recommended she speak with [name], PA-C regarding plant based protein options. She described implementing discussed habit stacks. Notably, [name] acknowledged challenges with emotional eating behaviors, adding she is intentionally limiting certain foods in the home. Reviewed mindful eating strategies. Additionally, this  provider discussed other ways to be more present when eating (e.g., Think about how nourishing food is for your body). Overall, [name] was receptive to today's appointment as evidenced by openness to sharing, responsiveness to feedback, and willingness to continue engaging in mindfulness exercises and strategies.   Mental Status Examination:  Appearance: neat Behavior: appropriate to circumstances Mood: neutral Affect: mood congruent Speech: WNL Eye Contact: appropriate Psychomotor Activity: WNL Gait: unable to assess Thought Process: linear, logical, and goal directed and denies suicidal, homicidal, and self-harm ideation, plan and intent  Thought Content/Perception: no hallucinations, delusions, bizarre thinking or behavior endorsed or observed Orientation: AAOx4 Memory/Concentration: intact Insight: fair Judgment: fair  Interventions:  Conducted a brief chart review Conducted a risk assessment Provided empathic reflections and validation Reviewed content from the previous session Provided positive reinforcement Employed supportive psychotherapy interventions to facilitate reduced distress and to improve coping skills with identified stressors Psychoeducation provided regarding mindfulness  DSM-5 Diagnosis(es):  F50.89 Other Specified Feeding or Eating Disorder, Emotional and Binge Eating Behaviors, F90.9 Unspecified Attention-Deficit/Hyperactivity Disorder , and F31.9 Unspecified Bipolar and Related Disorder  Treatment Goal & Progress: [name] re-established care with this provider and the following treatment goal was established: increase coping skills. [name] has demonstrated progress in her goal as evidenced by her self-report of reducing soda intake and willingness to implement mindfulness-based strategies.    Plan: The next appointment is scheduled for 04/03/2023 at 4pm, which will be via MyChart Video Visit. The next session will focus on working towards the established treatment  goal. [name] reported a desire to re-initiate services with her primary therapist, but stated finances continue to pose a challenge; however, she agreed to follow-up with her primary therapist to explore options. This provider also discussed additional referral options.     Lawerance Cruel, PsyD

## 2023-03-20 ENCOUNTER — Encounter: Payer: Self-pay | Admitting: Physical Therapy

## 2023-03-20 ENCOUNTER — Ambulatory Visit: Payer: PPO | Attending: Neurology | Admitting: Physical Therapy

## 2023-03-20 DIAGNOSIS — R2689 Other abnormalities of gait and mobility: Secondary | ICD-10-CM | POA: Insufficient documentation

## 2023-03-20 DIAGNOSIS — R2681 Unsteadiness on feet: Secondary | ICD-10-CM | POA: Diagnosis present

## 2023-03-20 DIAGNOSIS — Z79899 Other long term (current) drug therapy: Secondary | ICD-10-CM | POA: Insufficient documentation

## 2023-03-20 DIAGNOSIS — H8111 Benign paroxysmal vertigo, right ear: Secondary | ICD-10-CM | POA: Insufficient documentation

## 2023-03-20 DIAGNOSIS — R42 Dizziness and giddiness: Secondary | ICD-10-CM | POA: Diagnosis present

## 2023-03-20 DIAGNOSIS — G43109 Migraine with aura, not intractable, without status migrainosus: Secondary | ICD-10-CM | POA: Diagnosis not present

## 2023-03-20 NOTE — Therapy (Unsigned)
OUTPATIENT PHYSICAL THERAPY VESTIBULAR EVALUATION     Patient Name: [name] MRN: [id number] DOB:[birth year], 53 y.o., female Today's Date: 03/20/2023  END OF SESSION:  PT End of Session - 03/20/23 1432     Visit Number 1    Number of Visits 9    Date for PT Re-Evaluation 04/27/23   extended 1 week due to schedule availability   Authorization Type HTA    Authorization Time Period 03-20-23 - 05-18-23    PT Start Time 0847    PT Stop Time 0930    PT Time Calculation (min) 43 min    Activity Tolerance Patient tolerated treatment well    Behavior During Therapy [facility] for tasks assessed/performed             Past Medical History:  Diagnosis Date   Achilles tendinitis    ADHD (attention deficit hyperactivity disorder), inattentive type    Diagnosed as an adult after starting college   Allergy    Arthritis    Arthritis    Ataxia    Back pain    Bipolar I disorder 04/18/2014   most recent episode mixed   Borderline personality disorder 10/27/2011   Cataracts, bilateral    Chest pain    Chronic pain disorder    due to several injuries affecting numerous areas of her body throughout the years   Chronic paroxysmal hemicrania, 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   rt chest wall nodule removal   GANGLION CYST EXCISION  2011   lipoma removal     right bunioectomy     SHOULDER SURGERY  01/13/2011   right, partial tear   tumor resection left thigh     Patient Active Problem List   Diagnosis Date Noted   Impaired functional mobility, balance, gait, and endurance 03/08/2023   Unexplained falls 03/08/2023   BPV (benign positional vertigo), right 03/08/2023   Other Specified Feeding or Eating Disorder, Emotional and Binge Eating Behaviors 01/15/2023   Pain in joint of right knee 01/15/2023   Other fatigue 09/24/2022   BMI 37.0-37.9, adult 03/14/2022   Shoulder subluxation, left, initial encounter 02/01/2022   Environmental allergies 01/31/2022   Class 3 severe obesity with serious comorbidity and body mass index (BMI) of 40.0 to 44.9 in adult (HCC) 01/31/2022   Bipolar disorder (HCC) 11/28/2021   Arthritis of first metatarsophalangeal (MTP)  joint of right foot 10/24/2021   Migraines 08/18/2021   Cervical radiculopathy 08/16/2021   Lipoma of left forearm 08/16/2021   Carpal tunnel syndrome on left 08/16/2021   Vitamin D deficiency 07/26/2021   Eating disorder 07/26/2021   Insulin resistance 07/06/2021   Other hyperlipidemia 07/06/2021   B12 deficiency 07/06/2021   Foot sprain, left, initial encounter 06/01/2021   Mood disorder (HCC) 04/29/2021   Sleep difficulties 04/29/2021   Other constipation 04/29/2021   Essential hypertension 04/29/2021   Sprain of ankle 03/29/2021   Subacromial bursitis of left shoulder joint 03/29/2021   Concussion with no loss of consciousness 02/07/2021   Lumbar radiculopathy 02/07/2021   Shoulder impingement syndrome, left 11/01/2020   Sprain of metacarpophalangeal joint of left  thumb 10/07/2020   Posterior tibial tendinitis of right lower extremity 09/13/2020   Labral tear of hip, degenerative 09/13/2020   Attention and concentration deficit 05/07/2020   Morbid obesity (HCC) 05/07/2020   Plantar wart 05/07/2020   Sleep apnea 05/07/2020   Urinary incontinence 05/07/2020   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   Genetic testing 02/27/2018   Family history of genetic disease carrier    Family history of breast cancer    Family history of colon cancer    History of multiple concussions 11/20/2017   Ataxia 11/20/2017   Cervical strain, acute, initial encounter 09/19/2016   Insomnia 02/17/2015   Right shoulder pain 12/31/2014   Episodic cluster headache, not intractable 12/02/2014   Chronic paroxysmal hemicrania, 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   Photophobia of both eyes 05/04/2014   Nausea with vomiting 05/04/2014   Bipolar I disorder, most recent episode mixed (HCC) 04/18/2014   Suicidal ideation 04/12/2014   Injury of right shoulder and upper arm 02/17/2014   Migraine with status migrainosus 01/08/2013   Chronic migraine 05/08/2012   Hypertension    Contact dermatitis 11/27/2011   Generalized anxiety disorder 10/27/2011   ADHD (attention deficit hyperactivity disorder), 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   Borderline personality disorder (HCC) 10/27/2011   Right foot pain 09/28/2011   Loss of transverse plantar arch 09/01/2011   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   Ganglion cyst 09/29/2009   Pes planus 07/01/2008    PCP: No PCP per pt  REFERRING PROVIDER: Dohmeier, Porfirio Mylar, MD  REFERRING DIAG: H81.11 (ICD-10-CM) - BPV (benign positional vertigo), right Z79.899 (ICD-10-CM) - Medication management G43.109 (ICD-10-CM) - Migraine with aura and without status migrainosus, not intractable R42 (ICD-10-CM) - Vertigo  THERAPY DIAG:  Dizziness and giddiness  Unsteadiness on feet  Other  abnormalities of gait and mobility  ONSET DATE: January 2025:  Referral date   Rationale for Evaluation and Treatment: Rehabilitation  SUBJECTIVE:   SUBJECTIVE STATEMENT: Pt reports she has fallen twice at work in past 6 weeks due to dizziness;  pt states she gets dizzy if she turns quickly - has to hold onto something at work- says 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  Pt saw Dr. Vickey Huger on 03-08-23 due to c/o worsening memory and dizziness; per chart note  "My assistant today tested the patient via [facility] cognitive assessment and she did excellent:  there were 26 out of 30 points which today and there is no evidence of short term memory loss limited visual-spatial capacity or attention deficit."  Pt reports Dr. Vickey Huger gave pt Epley maneuver to do for self treatment but pt reports this exercise  did not help. Pt accompanied by: self  PERTINENT HISTORY: Bipolar Disorder, ADHD, Achilles Tendinitis, Chronic Pain, Migraines, HIstory of Multiple Concussions, HTN, Insomnia   PAIN:  Are you having pain?  Rt knee pain due to ACL sx in May 2024 - currently wearing brace - weight bearing aggravates it  PRECAUTIONS: None  RED FLAGS: None   WEIGHT BEARING RESTRICTIONS: No  FALLS: Has patient fallen in last 6 months? Yes. Number of falls 3  LIVING ENVIRONMENT: Lives with: {OPRC lives with:25569::"lives with their family"} Lives in: {Lives in:25570} Stairs: {opstairs:27293} Has following equipment at home: {Assistive devices:23999}  PLOF: Independent  PATIENT GOALS: "be steadier and not fall"  OBJECTIVE:  Note: Objective measures were completed at Evaluation unless otherwise noted.  DIAGNOSTIC FINDINGS: ***  COGNITION: Overall cognitive status: Within functional limits for tasks assessed   SENSATION: {sensation:27233}  EDEMA:  {edema:24020}  MUSCLE TONE:  {LE tone:25568}  DTRs:  {DTR SITE:24025}  POSTURE:   {posture:25561}  Cervical ROM:    {AROM/PROM:27142} A/PROM (deg) eval  Flexion   Extension   Right lateral flexion   Left lateral flexion   Right rotation   Left rotation   (Blank rows = not tested)  STRENGTH: ***  LOWER EXTREMITY MMT:   MMT Right eval Left eval  Hip flexion    Hip abduction    Hip adduction    Hip internal rotation    Hip external rotation    Knee flexion    Knee extension    Ankle dorsiflexion    Ankle plantarflexion    Ankle inversion    Ankle eversion    (Blank rows = not tested)  BED MOBILITY:  {Bed mobility:24027}  TRANSFERS: Assistive device utilized: {Assistive devices:23999}  Sit to stand: {Levels of assistance:24026} Stand to sit: {Levels of assistance:24026} Chair to chair: {Levels of assistance:24026} Floor: {Levels of assistance:24026}  RAMP: {Levels of assistance:24026}  CURB: {Levels of assistance:24026}  GAIT: Gait pattern: {gait characteristics:25376} Distance walked: *** Assistive device utilized: {Assistive devices:23999} Level of assistance: {Levels of assistance:24026} Comments: ***  FUNCTIONAL TESTS:  {Functional tests:24029}  PATIENT SURVEYS:  {rehab surveys:24030}  VESTIBULAR ASSESSMENT:  GENERAL OBSERVATION: ***   SYMPTOM BEHAVIOR:  Subjective history: ***  Non-Vestibular symptoms: headaches and migraine symptoms  Type of dizziness: Imbalance (Disequilibrium), Unsteady with head/body turns, and "Funny feeling in the head"  Frequency: varies - usually every other day - never goes away completely  Duration: ***  Aggravating factors: Induced by motion: activity in general, Worse with fatigue, Worse in the dark, Occurs when standing still , Moving eyes, and quick turns  Relieving factors: rest and slow movements  Progression of symptoms: worse  OCULOMOTOR EXAM:  Ocular Alignment: {Ocular Alignment:25262}  Ocular ROM: {RANGE OF MOTION:21649}  Spontaneous Nystagmus: {Spontaneous  nystagmus:25263}  Gaze-Induced Nystagmus: {gaze-induced nystagmus:25264}  Smooth Pursuits: {smooth pursuit:25265}  Saccades: {saccades:25266}  Convergence/Divergence: *** cm   FRENZEL - FIXATION SUPRESSED:  Ocular Alignment: {Ocular Alignment:25262}  Spontaneous Nystagmus: {Spontaneous nystagmus:25263}  Gaze-Induced Nystagmus: {gaze-induced nystagmus:25264}  Horizontal head shaking - induced nystagmus: {head shaking induced nystagmus:25267}  Vertical head shaking - induced nystagmus: {head shaking induced nystagmus:25267}  Positional tests: {Positional tests:25271}  Pressure tests: {frenzel pressure tests:25268}  VESTIBULAR - OCULAR REFLEX:   Slow VOR: {slow VOR:25290}  VOR Cancellation: {vor cancellation:25291}  Head-Impulse Test: {head impulse test:25272}  Dynamic Visual Acuity: {dynamic visual acuity:25273}   POSITIONAL TESTING: {Positional tests:25271}  MOTION SENSITIVITY:  Motion Sensitivity Quotient Intensity: 0 = none, 1 = Lightheaded, 2 = Mild, 3 =  Moderate, 4 = Severe, 5 = Vomiting  Intensity  1. Sitting to supine 0  2. Supine to L side 0  3. Supine to R side 0  4. Supine to sitting 1  5. L Hallpike-Dix   6. Up from L    7. R Hallpike-Dix   8. Up from R    9. Sitting, head tipped to L knee   10. Head up from L knee   11. Sitting, head tipped to R knee   12. Head up from R knee   13. Sitting head turns x5   14.Sitting head nods x5   15. In stance, 180 turn to L    16. In stance, 180 turn to R     OTHOSTATICS: {Exam; orthostatics:31331}  FUNCTIONAL GAIT: {Functional tests:24029}                                                                                                                             TREATMENT DATE: ***   Canalith Repositioning:  {Canalith Repositioning:25283} Gaze Adaptation:  {gaze adaptation:25286} Habituation:  {habituation:25288} Other: ***  PATIENT EDUCATION: Education details: *** Person educated: {Person  educated:25204} Education method: {Education Method:25205} Education comprehension: {Education Comprehension:25206}  HOME EXERCISE PROGRAM:  GOALS: Goals reviewed with patient? {yes/no:20286}  SHORT TERM GOALS: Target date: ***  *** Baseline: Goal status: {GOALSTATUS:25110}  2.  *** Baseline:  Goal status: {GOALSTATUS:25110}  3.  *** Baseline:  Goal status: {GOALSTATUS:25110}  4.  *** Baseline:  Goal status: {GOALSTATUS:25110}  5.  *** Baseline:  Goal status: {GOALSTATUS:25110}  6.  *** Baseline:  Goal status: {GOALSTATUS:25110}  LONG TERM GOALS: Target date: ***  *** Baseline:  Goal status: {GOALSTATUS:25110}  2.  *** Baseline:  Goal status: {GOALSTATUS:25110}  3.  *** Baseline:  Goal status: {GOALSTATUS:25110}  4.  *** Baseline:  Goal status: {GOALSTATUS:25110}  5.  *** Baseline:  Goal status: {GOALSTATUS:25110}  6.  *** Baseline:  Goal status: {GOALSTATUS:25110}  ASSESSMENT:  CLINICAL IMPRESSION: Patient is a *** y.o. *** who was seen today for physical therapy evaluation and treatment for ***.   OBJECTIVE IMPAIRMENTS: {opptimpairments:25111}.   ACTIVITY LIMITATIONS: {activitylimitations:27494}  PARTICIPATION LIMITATIONS: {participationrestrictions:25113}  PERSONAL FACTORS: {Personal factors:25162} are also affecting patient's functional outcome.   REHAB POTENTIAL: {rehabpotential:25112}  CLINICAL DECISION MAKING: {clinical decision making:25114}  EVALUATION COMPLEXITY: {Evaluation complexity:25115}   PLAN:  PT FREQUENCY: {rehab frequency:25116}  PT DURATION: {rehab duration:25117}  PLANNED INTERVENTIONS: {rehab planned interventions:25118::"97110-Therapeutic exercises","97530- Therapeutic [id number]- Neuromuscular re-education","97535- Self LKGM","01027- Manual therapy"}  PLAN FOR NEXT SESSION: ***   Kary Kos, PT 03/20/2023, 2:37 PM

## 2023-03-27 ENCOUNTER — Ambulatory Visit: Payer: PPO | Admitting: Physical Therapy

## 2023-03-29 ENCOUNTER — Ambulatory Visit: Payer: PPO | Admitting: Physical Therapy

## 2023-03-29 ENCOUNTER — Encounter: Payer: Self-pay | Admitting: Physical Therapy

## 2023-03-29 DIAGNOSIS — R42 Dizziness and giddiness: Secondary | ICD-10-CM | POA: Diagnosis not present

## 2023-03-29 DIAGNOSIS — R2689 Other abnormalities of gait and mobility: Secondary | ICD-10-CM

## 2023-03-29 DIAGNOSIS — R2681 Unsteadiness on feet: Secondary | ICD-10-CM

## 2023-03-29 NOTE — Therapy (Signed)
 OUTPATIENT PHYSICAL THERAPY VESTIBULAR TREATMENT NOTE     Patient Name: [name] MRN: [id number] DOB:06/17/1970, 53 y.o., female Today's Date: 03/29/2023   PCP: No PCP per pt  REFERRING PROVIDER: Dohmeier, Porfirio Mylar, MD  END OF SESSION:  PT End of Session - 03/29/23 2032     Visit Number 2    Number of Visits 9    Date for PT Re-Evaluation 04/27/23   extended 1 week due to schedule availability   Authorization Type HTA    Authorization Time Period 03-20-23 - 05-18-23    PT Start Time 1315    PT Stop Time 1400    PT Time Calculation (min) 45 min    Activity Tolerance Patient tolerated treatment well    Behavior During Therapy [facility] for tasks assessed/performed              Past Medical History:  Diagnosis Date   Achilles tendinitis    ADHD (attention deficit hyperactivity disorder), inattentive type    Diagnosed as an adult after starting college   Allergy    Arthritis    Arthritis    Ataxia    Back pain    Bipolar I disorder 04/18/2014   most recent episode mixed   Borderline personality disorder 10/27/2011   Cataracts, bilateral    Chest pain    Chronic pain disorder    due to several injuries affecting numerous areas of her body throughout the years   Chronic paroxysmal hemicrania, 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   rt chest wall nodule removal   GANGLION CYST EXCISION  2011   lipoma removal     right bunioectomy     SHOULDER SURGERY  01/13/2011   right, partial tear   tumor resection left thigh     Patient Active Problem List   Diagnosis Date Noted   Impaired functional mobility, balance, gait, and endurance 03/08/2023   Unexplained falls 03/08/2023   BPV (benign positional vertigo), right 03/08/2023   Other Specified Feeding or Eating Disorder, Emotional and Binge Eating Behaviors 01/15/2023   Pain in joint of right knee 01/15/2023   Other fatigue 09/24/2022   BMI 37.0-37.9, adult 03/14/2022   Shoulder subluxation, left, initial encounter 02/01/2022   Environmental allergies 01/31/2022   Class 3 severe obesity with serious comorbidity and body mass index (BMI) of 40.0 to 44.9 in adult (  HCC) 01/31/2022   Bipolar disorder (HCC) 11/28/2021   Arthritis of first metatarsophalangeal (MTP) joint of right foot 10/24/2021   Migraines 08/18/2021   Cervical radiculopathy 08/16/2021   Lipoma of left forearm 08/16/2021   Carpal tunnel syndrome on left 08/16/2021   Vitamin D deficiency 07/26/2021   Eating disorder 07/26/2021   Insulin resistance 07/06/2021   Other hyperlipidemia 07/06/2021   B12 deficiency 07/06/2021   Foot sprain, left, initial encounter 06/01/2021   Mood disorder (HCC) 04/29/2021   Sleep difficulties 04/29/2021   Other constipation 04/29/2021   Essential hypertension 04/29/2021   Sprain of ankle 03/29/2021   Subacromial bursitis of left shoulder joint 03/29/2021   Concussion with no loss of consciousness 02/07/2021   Lumbar radiculopathy 02/07/2021   Shoulder impingement  syndrome, left 11/01/2020   Sprain of metacarpophalangeal joint of left thumb 10/07/2020   Posterior tibial tendinitis of right lower extremity 09/13/2020   Labral tear of hip, degenerative 09/13/2020   Attention and concentration deficit 05/07/2020   Morbid obesity (HCC) 05/07/2020   Plantar wart 05/07/2020   Sleep apnea 05/07/2020   Urinary incontinence 05/07/2020   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   Genetic testing 02/27/2018   Family history of genetic disease carrier    Family history of breast cancer    Family history of colon cancer    History of multiple concussions 11/20/2017   Ataxia 11/20/2017   Cervical strain, acute, initial encounter 09/19/2016   Insomnia 02/17/2015   Right shoulder pain 12/31/2014   Episodic cluster headache, not intractable 12/02/2014   Chronic paroxysmal hemicrania, 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   Photophobia of both eyes 05/04/2014   Nausea with vomiting 05/04/2014   Bipolar I disorder, most recent episode mixed (HCC) 04/18/2014   Suicidal ideation 04/12/2014   Injury of right shoulder and upper arm 02/17/2014   Migraine with status migrainosus 01/08/2013   Chronic migraine 05/08/2012   Hypertension    Contact dermatitis 11/27/2011   Generalized anxiety disorder 10/27/2011   ADHD (attention deficit hyperactivity disorder), 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   Borderline personality disorder (HCC) 10/27/2011   Right foot pain 09/28/2011   Loss of transverse plantar arch 09/01/2011   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   Ganglion cyst 09/29/2009   Pes planus 07/01/2008     REFERRING DIAG: H81.11 (ICD-10-CM) - BPV (benign positional vertigo), right Z79.899 (ICD-10-CM) - Medication management G43.109 (ICD-10-CM) - Migraine with aura and without status migrainosus, not intractable R42 (ICD-10-CM) - Vertigo  THERAPY DIAG:  Unsteadiness on feet  Dizziness and  giddiness  Other abnormalities of gait and mobility  ONSET DATE: January 2025:  Referral date   Rationale for Evaluation and Treatment: Rehabilitation  SUBJECTIVE:   SUBJECTIVE STATEMENT: Pt reports she just came from [facility] - says her ortho doctor wants her to do PT there because she isn't bending her Rt knee enough when she walks, causing increased pain in her Lt hip.  Has not done the balance on foam exercises because she has been busy working a lot lately. Pt accompanied by: self  PERTINENT HISTORY: Bipolar Disorder, ADHD, Achilles Tendinitis, Chronic Pain, Migraines, HIstory of Multiple Concussions, HTN, Insomnia   PAIN:  Are you having pain?  Rt knee pain due to ACL sx in May 2024 - currently wearing brace - weight bearing aggravates it  PRECAUTIONS: None  RED FLAGS: None   WEIGHT BEARING RESTRICTIONS: No  FALLS: Has patient fallen in last 6 months?  Yes. Number of falls 3  LIVING ENVIRONMENT: Lives with: lives with their family Lives in: House/apartment Stairs: Yes: External: 3 steps; on right going up  PLOF: Independent  PATIENT GOALS: "be steadier and not fall"  OBJECTIVE: Note: Objective measures were completed at Evaluation unless otherwise noted.  DIAGNOSTIC FINDINGS: IMPRESSION:  (CT Head 01/24) - no imaging of head since this date No acute intracranial process.  COGNITION: Overall cognitive status: Within functional limits for tasks assessed - pt scored well on cognitive assessment administered by neurology   SENSATION: WFL  POSTURE:  rounded shoulders, forward head, and increased thoracic kyphosis  Cervical ROM:  WFL's   STRENGTH: WFL's - pt is wearing knee brace on Rt knee due to ACL sx in May '24  LOWER EXTREMITY MMT:  NT due to vertigo eval   BED MOBILITY:  Independent  TRANSFERS: Assistive device utilized: None  Sit to stand: Modified independence Stand to sit: Modified independence   GAIT: Gait pattern: [facility] Distance  walked: 65' Assistive device utilized: None Level of assistance: Modified independence Comments: pt wearing brace on Rt knee due to s/p ACL sx in May 2024  VESTIBULAR ASSESSMENT:  GENERAL OBSERVATION: pt known to this PT and clinic with most recent previous admission Feb. - March 2023 for 10 visits for same diagnosis (dizziness and imbalance)   SYMPTOM BEHAVIOR:  Subjective history: pt reports she has had 2 episodes on vertigo at work in past 6 weeks in which she has lost her balance and fallen; exact etiology of vertigo is unknown - Dr. Vickey Huger gave her Epley to do which pt states it slightly helped for just a couple of days and then no improvement with dizziness returning  Non-Vestibular symptoms: headaches and migraine symptoms  Type of dizziness: Imbalance (Disequilibrium), Unsteady with head/body turns, and "Funny feeling in the head"  Frequency: varies - usually every other day - never goes away completely  Duration: varies - usually minutes for increased severity but dizziness is mostly constant  Aggravating factors: Induced by motion: activity in general, Worse with fatigue, Worse in the dark, Occurs when standing still , Moving eyes, and quick turns  Relieving factors: rest and slow movements  Progression of symptoms: worse  OCULOMOTOR EXAM:  Ocular Alignment: normal  Ocular ROM: No Limitations  Spontaneous Nystagmus: absent  Gaze-Induced Nystagmus: absent  Smooth Pursuits: intact; c/o dizziness (mild) with vertical pursuit testing  Saccades: intact  POSITIONAL TESTING: Right Sidelying: no nystagmus Left Sidelying: no nystagmus  MOTION SENSITIVITY:  Motion Sensitivity Quotient Intensity: 0 = none, 1 = Lightheaded, 2 = Mild, 3 = Moderate, 4 = Severe, 5 = Vomiting  Intensity  1. Sitting to supine 0  2. Supine to L side 0  3. Supine to R side 0  4. Supine to sitting 1  5. L Hallpike-Dix   6. Up from L    7. R Hallpike-Dix   8. Up from R    9. Sitting, head tipped to L  knee   10. Head up from L knee   11. Sitting, head tipped to R knee   12. Head up from R knee   13. Sitting head turns x5 2  14.Sitting head nods x5 2  15. In stance, 180 turn to L    16. In stance, 180 turn to R    FUNCTIONAL GAIT: MCTSIB: Condition 1: Avg of 3 trials: 30 sec, Condition 2: Avg of 3 trials: 30 sec, Condition 3: Avg of 3 trials: 30 sec, Condition  4: Avg of 3 trials: 30 sec, and Total Score: 120/120;  moderate postural sway on condition 4 but no LOB                                                                                                                             TREATMENT DATE: 03-29-23  SVA - line 10;  DVA line 7 (abnormal as it is a 3 line difference)   Pt performed x1 viewing in standing - target on plain background - 30 secs x 1 rep horizontal; 30 secs x 1 rep vertical head turns - dizziness reported as 5-6/10 intensity upon completion of exercise  Rockerboard performed inside // bars - with bil UE support, progressing to 1 UE support, then to only UE prn for recovery of LOB- rocking anteriorly/posteriorly 10 reps with EO and then with EC; holding board steady - head turns horizontally 5 reps, then vertical 5 reps;  performed stepping down from rockerboard 5 reps each leg with head turn to opposite side - with UE support on // bar prn  Sit to stand with pivot turn 5 reps to Rt side and 5 reps to Lt side  Pt performed ambulation with tracking ball - CW approx. 35' x 1 rep and then CCW 35' x 1 rep  Balance on foam - EO for 10 secs , then with horizontal head turns 5 reps and vertical head turns 5 reps; with EC 10 sec hold without head turns: slow horizontal head turns 5 reps, then vertical head turns 5 reps with UE support on back of chair prn for assist with balance  PATIENT EDUCATION: Education details:  x1 viewing added on 03-29-23  Person educated: Patient Education method: Explanation, Demonstration, and Handouts Education comprehension: verbalized  understanding and returned demonstration  HOME EXERCISE PROGRAM:  balance on foam - EO and EC - feet apart  Access Code: [email or web address] URL: [email or web address] Date: 03/21/2023 Prepared by: Maebelle Munroe  Exercises - Standing on foam pad - Eyes open and then with Eyes closed   - 1 x daily - 7 x weekly - 3 sets - 10 reps  GOALS: Goals reviewed with patient? Yes  SHORT TERM GOALS: same as LTG's as ELOS = 4 weeks   LONG TERM GOALS: Target date: 04-27-23  Pt will improve DHI score by at least 10 points to demo reduced dizziness for improved quality of life. Baseline:  Goal status: INITIAL  2.  Pt will demo increased vestibular input in maintaining balance by standing with EC on foam with minimal to no postural sway.  Baseline:  Goal status: INITIAL  3.  Pt will amb. 36' with horizontal head turns without c/o dizziness and without LOB to increase safety with ambulation for environmental scanning.  Baseline:  Goal status: INITIAL  4.  Pt will perform 3 turns to each side (180 degrees) without c/o dizziness to increase safety with work activities and for habituation.  Baseline:  Goal status: INITIAL  5.  Independent in HEP for balance and vestibular exercises. Baseline:  Goal status: INITIAL   ASSESSMENT:  CLINICAL IMPRESSION: PT session focused on balance exercises with increased vestibular input incorporated and gaze stabilization exercise was added to HEP.  Pt had a 3 line difference between SVA and DVA, indicative of mildly abnormal VOR.  Pt reported dizziness intensity was 5-6/10 after performing vertical x1 viewing in standing.  Dizziness subsided after approx. 1" rest period.  Pt had more unsteadiness with amb. With tracking ball in CCW direction than with CW direction.  Cont with POC.  OBJECTIVE IMPAIRMENTS: decreased balance, difficulty walking, and dizziness.   ACTIVITY LIMITATIONS: squatting, transfers, locomotion level, and quick turns with work  activities  PARTICIPATION LIMITATIONS: community activity, occupation, and yard work  PERSONAL FACTORS: Behavior pattern, Past/current experiences, and Time since onset of injury/illness/exacerbation are also affecting patient's functional outcome.   REHAB POTENTIAL: Fair due to chronicity of problem and exact etiology of dizziness unknown  CLINICAL DECISION MAKING: Evolving/moderate complexity  EVALUATION COMPLEXITY: Moderate   PLAN:  PT FREQUENCY: 2x/week  PT DURATION: 4 weeks + eval  PLANNED INTERVENTIONS: 97110-Therapeutic exercises, 97530- Therapeutic activity, [id number]- Neuromuscular re-education, [phone]- Self Care, [id number]- Gait training, Patient/Family education, and Vestibular training  PLAN FOR NEXT SESSION: cont balance/vestibular exercises   Fernie Grimm, Donavan Burnet, PT 03/29/2023, 8:35 PM

## 2023-03-29 NOTE — Patient Instructions (Signed)
 Gaze Stabilization: Tip Card  1.Target must remain in focus, not blurry, and appear stationary while head is in motion. 2.Perform exercises with small head movements (45 to either side of midline). 3.Increase speed of head motion so long as target is in focus. 4.If you wear eyeglasses, be sure you can see target through lens (therapist will give specific instructions for bifocal / progressive lenses). 5.These exercises may provoke dizziness or nausea. Work through these symptoms. If too dizzy, slow head movement slightly. Rest between each exercise. 6.Exercises demand concentration; avoid distractions. 7.For safety, perform standing exercises close to a counter, wall, corner, or next to someone.     Gaze Stabilization: Standing Feet Apart    Feet shoulder width apart, keeping eyes on target on wall _4-5___ feet away, tilt head down 15-30 and move head side to side for __30__ seconds. Repeat while moving head up and down for __30__ seconds. Do _2-3___ sessions per day. Repeat using target on pattern background.  Copyright  VHI. All rights reserved.

## 2023-03-30 ENCOUNTER — Other Ambulatory Visit: Payer: Self-pay | Admitting: Adult Health

## 2023-04-03 ENCOUNTER — Ambulatory Visit (INDEPENDENT_AMBULATORY_CARE_PROVIDER_SITE_OTHER): Payer: PPO | Admitting: Physician Assistant

## 2023-04-03 ENCOUNTER — Ambulatory Visit: Payer: PPO | Attending: Neurology | Admitting: Physical Therapy

## 2023-04-03 ENCOUNTER — Telehealth (INDEPENDENT_AMBULATORY_CARE_PROVIDER_SITE_OTHER): Payer: PPO | Admitting: Psychology

## 2023-04-03 DIAGNOSIS — R42 Dizziness and giddiness: Secondary | ICD-10-CM | POA: Diagnosis present

## 2023-04-03 DIAGNOSIS — R2689 Other abnormalities of gait and mobility: Secondary | ICD-10-CM | POA: Diagnosis present

## 2023-04-03 DIAGNOSIS — R2681 Unsteadiness on feet: Secondary | ICD-10-CM | POA: Insufficient documentation

## 2023-04-03 NOTE — Therapy (Unsigned)
 OUTPATIENT PHYSICAL THERAPY VESTIBULAR TREATMENT NOTE     Patient Name: [name] MRN: [id number] DOB:[birth year], 53 y.o., female Today's Date: 04/04/2023   PCP: No PCP per pt  REFERRING PROVIDER: Dohmeier, Porfirio Mylar, MD  END OF SESSION:  PT End of Session - 04/04/23 1620     Visit Number 3    Number of Visits 9    Date for PT Re-Evaluation 04/27/23   extended 1 week due to schedule availability   Authorization Type HTA    Authorization Time Period 03-20-23 - 05-18-23    PT Start Time 1330   pt arrived early for 2:00 appt time   PT Stop Time 1417    PT Time Calculation (min) 47 min    Equipment Utilized During Treatment Gait belt    Activity Tolerance Patient tolerated treatment well    Behavior During Therapy WFL for tasks assessed/performed               Past Medical History:  Diagnosis Date   Achilles tendinitis    ADHD (attention deficit hyperactivity disorder), inattentive type    Diagnosed as an adult after starting college   Allergy    Arthritis    Arthritis    Ataxia    Back pain    Bipolar I disorder 04/18/2014   most recent episode mixed   Borderline personality disorder 10/27/2011   Cataracts, bilateral    Chest pain    Chronic pain disorder    due to several injuries affecting numerous areas of her body throughout the years   Chronic paroxysmal hemicrania, 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   rt chest wall nodule removal   GANGLION CYST EXCISION  2011   lipoma removal     right bunioectomy     SHOULDER SURGERY  01/13/2011   right, partial tear   tumor resection left thigh     Patient Active Problem List   Diagnosis Date Noted   Impaired functional mobility, balance, gait, and endurance 03/08/2023   Unexplained falls 03/08/2023   BPV (benign positional vertigo), right 03/08/2023   Other Specified Feeding or Eating Disorder, Emotional and Binge Eating Behaviors 01/15/2023   Pain in joint of right knee 01/15/2023   Other fatigue 09/24/2022   BMI 37.0-37.9, adult 03/14/2022   Shoulder subluxation, left, initial encounter 02/01/2022   Environmental allergies 01/31/2022  Class 3 severe obesity with serious comorbidity and body mass index (BMI) of 40.0 to 44.9 in adult [facility]) 01/31/2022   Bipolar disorder (HCC) 11/28/2021   Arthritis of first metatarsophalangeal (MTP) joint of right foot 10/24/2021   Migraines 08/18/2021   Cervical radiculopathy 08/16/2021   Lipoma of left forearm 08/16/2021   Carpal tunnel syndrome on left 08/16/2021   Vitamin D deficiency 07/26/2021   Eating disorder 07/26/2021   Insulin resistance 07/06/2021   Other hyperlipidemia 07/06/2021   B12 deficiency 07/06/2021   Foot sprain, left, initial encounter 06/01/2021   Mood disorder (HCC) 04/29/2021   Sleep difficulties 04/29/2021   Other constipation 04/29/2021   Essential hypertension 04/29/2021   Sprain of ankle 03/29/2021   Subacromial bursitis of left shoulder joint 03/29/2021   Concussion with no loss of  consciousness 02/07/2021   Lumbar radiculopathy 02/07/2021   Shoulder impingement syndrome, left 11/01/2020   Sprain of metacarpophalangeal joint of left thumb 10/07/2020   Posterior tibial tendinitis of right lower extremity 09/13/2020   Labral tear of hip, degenerative 09/13/2020   Attention and concentration deficit 05/07/2020   Morbid obesity (HCC) 05/07/2020   Plantar wart 05/07/2020   Sleep apnea 05/07/2020   Urinary incontinence 05/07/2020   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   Genetic testing 02/27/2018   Family history of genetic disease carrier    Family history of breast cancer    Family history of colon cancer    History of multiple concussions 11/20/2017   Ataxia 11/20/2017   Cervical strain, acute, initial encounter 09/19/2016   Insomnia 02/17/2015   Right shoulder pain 12/31/2014   Episodic cluster headache, not intractable 12/02/2014   Chronic paroxysmal hemicrania, 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   Photophobia of both eyes 05/04/2014   Nausea with vomiting 05/04/2014   Bipolar I disorder, most recent episode mixed (HCC) 04/18/2014   Suicidal ideation 04/12/2014   Injury of right shoulder and upper arm 02/17/2014   Migraine with status migrainosus 01/08/2013   Chronic migraine 05/08/2012   Hypertension    Contact dermatitis 11/27/2011   Generalized anxiety disorder 10/27/2011   ADHD (attention deficit hyperactivity disorder), 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   Borderline personality disorder (HCC) 10/27/2011   Right foot pain 09/28/2011   Loss of transverse plantar arch 09/01/2011   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   Ganglion cyst 09/29/2009   Pes planus 07/01/2008     REFERRING DIAG: H81.11 (ICD-10-CM) - BPV (benign positional vertigo), right Z79.899 (ICD-10-CM) - Medication management G43.109 (ICD-10-CM) - Migraine with aura and without status migrainosus, not intractable R42  (ICD-10-CM) - Vertigo  THERAPY DIAG:  Dizziness and giddiness  Unsteadiness on feet  ONSET DATE: January 2025:  Referral date   Rationale for Evaluation and Treatment: Rehabilitation  SUBJECTIVE:   SUBJECTIVE STATEMENT: Pt reports she had onset of moderate dizziness at work on Sunday evening - occurred about 45" before she was supposed to get off work - had to leave work early due to the dizziness Pt accompanied by: self  PERTINENT HISTORY: Bipolar Disorder, ADHD, Achilles Tendinitis, Chronic Pain, Migraines, HIstory of Multiple Concussions, HTN, Insomnia   PAIN:  Are you having pain?  Rt knee pain due to ACL sx in May 2024 - currently wearing brace - weight bearing aggravates it  PRECAUTIONS: None  RED FLAGS: None   WEIGHT BEARING RESTRICTIONS: No  FALLS: Has patient fallen in last 6 months? Yes. Number of falls 3  LIVING ENVIRONMENT:  Lives with: lives with their family Lives in: House/apartment Stairs: Yes: External: 3 steps; on right going up  PLOF: Independent  PATIENT GOALS: "be steadier and not fall"  OBJECTIVE: Note: Objective measures were completed at Evaluation unless otherwise noted.  DIAGNOSTIC FINDINGS: IMPRESSION:  (CT Head 01/24) - no imaging of head since this date No acute intracranial process.  COGNITION: Overall cognitive status: Within functional limits for tasks assessed - pt scored well on cognitive assessment administered by neurology   SENSATION: WFL  POSTURE:  rounded shoulders, forward head, and increased thoracic kyphosis  Cervical ROM:  WFL's   STRENGTH: WFL's - pt is wearing knee brace on Rt knee due to ACL sx in May '24  LOWER EXTREMITY MMT:  NT due to vertigo eval   BED MOBILITY:  Independent  TRANSFERS: Assistive device utilized: None  Sit to stand: Modified independence Stand to sit: Modified independence   GAIT: Gait pattern: [facility] Distance walked: 66' Assistive device utilized: None Level of assistance:  Modified independence Comments: pt wearing brace on Rt knee due to s/p ACL sx in May 2024  VESTIBULAR ASSESSMENT:  GENERAL OBSERVATION: pt known to this PT and clinic with most recent previous admission Feb. - March 2023 for 10 visits for same diagnosis (dizziness and imbalance)   SYMPTOM BEHAVIOR:  Subjective history: pt reports she has had 2 episodes on vertigo at work in past 6 weeks in which she has lost her balance and fallen; exact etiology of vertigo is unknown - Dr. Vickey Huger gave her Epley to do which pt states it slightly helped for just a couple of days and then no improvement with dizziness returning  Non-Vestibular symptoms: headaches and migraine symptoms  Type of dizziness: Imbalance (Disequilibrium), Unsteady with head/body turns, and "Funny feeling in the head"  Frequency: varies - usually every other day - never goes away completely  Duration: varies - usually minutes for increased severity but dizziness is mostly constant  Aggravating factors: Induced by motion: activity in general, Worse with fatigue, Worse in the dark, Occurs when standing still , Moving eyes, and quick turns  Relieving factors: rest and slow movements  Progression of symptoms: worse  OCULOMOTOR EXAM:  Ocular Alignment: normal  Ocular ROM: No Limitations  Spontaneous Nystagmus: absent  Gaze-Induced Nystagmus: absent  Smooth Pursuits: intact; c/o dizziness (mild) with vertical pursuit testing  Saccades: intact  POSITIONAL TESTING: Right Sidelying: no nystagmus Left Sidelying: no nystagmus  MOTION SENSITIVITY:  Motion Sensitivity Quotient Intensity: 0 = none, 1 = Lightheaded, 2 = Mild, 3 = Moderate, 4 = Severe, 5 = Vomiting  Intensity  1. Sitting to supine 0  2. Supine to L side 0  3. Supine to R side 0  4. Supine to sitting 1  5. L Hallpike-Dix   6. Up from L    7. R Hallpike-Dix   8. Up from R    9. Sitting, head tipped to L knee   10. Head up from L knee   11. Sitting, head tipped to R  knee   12. Head up from R knee   13. Sitting head turns x5 2  14.Sitting head nods x5 2  15. In stance, 180 turn to L    16. In stance, 180 turn to R    FUNCTIONAL GAIT: MCTSIB: Condition 1: Avg of 3 trials: 30 sec, Condition 2: Avg of 3 trials: 30 sec, Condition 3: Avg of 3 trials: 30 sec, Condition 4: Avg of 3 trials: 30 sec, and  Total Score: 120/120;  moderate postural sway on condition 4 but no LOB                                                                                                                             TREATMENT DATE: 04-03-23  NeuroRe-ed:  Performed visual exercises in standing - initially on floor, then progressed to standing on [employer] [place] chart placed on pt's Lt side on plain wall and straight ahead on plain door - pt stood approx. 4' away from charts; read top line of chart/bottom line of 2nd chart for improved saccadic movement and gaze stabilization Progressed to reading [place] charts on Lt side/Rt side for improved visual scanning with head turns/cervical rotation - read lines in various sequences and then specific colored letters on each [place] chart   Stood inside // bars - stepping in place - focusing on number on orange medium sized ball for balance with multi- tasking; tossed and caught ball with reading number closest in front on ball for improved gaze stabilization      TherAct:  Pt performed ambulation with tossing/catching ball in hallway approx. 30'  2 reps; progressed to making circles CW and then CCW with ball with tracking ball with eyes/head movement - 30'x 1 rep each  Balance on foam - EO for 10 secs , then with horizontal head turns 5 reps and vertical head turns 5 reps; with EC 10 sec hold without head turns: slow horizontal head turns 5 reps, then vertical head turns 5 reps with UE support on back of chair prn for assist with balance  Pt stood on Airex inside // bars - held ball out in front - moved horizontally in front - tracking ball with  eyes and head moving - 2 reps only due to provocation of dizziness  Pt amb. Inside // bars 10' x 1 rep tossing ball and reading number on ball during walking/catching  PATIENT EDUCATION: Education details:  x1 viewing added on 03-29-23  Person educated: Patient Education method: Explanation, Demonstration, and Handouts Education comprehension: verbalized understanding and returned demonstration  HOME EXERCISE PROGRAM:  balance on foam - EO and EC - feet apart  Access Code: [email or web address] URL: [email or web address] Date: 03/21/2023 Prepared by: Maebelle Munroe  Exercises - Standing on foam pad - Eyes open and then with Eyes closed   - 1 x daily - 7 x weekly - 3 sets - 10 reps  GOALS: Goals reviewed with patient? Yes  SHORT TERM GOALS: same as LTG's as ELOS = 4 weeks   LONG TERM GOALS: Target date: 04-27-23  Pt will improve DHI score by at least 10 points to demo reduced dizziness for improved quality of life. Baseline:  Goal status: INITIAL  2.  Pt will demo increased vestibular input in maintaining balance by standing with EC on foam with minimal to no postural sway.  Baseline:  Goal status: INITIAL  3.  Pt will amb. 80' with horizontal head  turns without c/o dizziness and without LOB to increase safety with ambulation for environmental scanning.  Baseline:  Goal status: INITIAL  4.  Pt will perform 3 turns to each side (180 degrees) without c/o dizziness to increase safety with work activities and for habituation.  Baseline:  Goal status: INITIAL  5.  Independent in HEP for balance and vestibular exercises. Baseline:  Goal status: INITIAL   ASSESSMENT:  CLINICAL IMPRESSION: PT session focused on vestibular/visual exercises to improve saccadic movements and gaze stabilization with moving targets.  Pt had postural instability with amb. With tracking ball in CW and CCW directions; pt reported moderate dizziness provoked with standing balance exercise reading lines  on [place] chart in various patterns.  Frequent short rest periods needed during session to allow dizziness to subside.  Pt has provocation of dizziness with tasks requiring increased visual/vestibular input.  Cont with POC.  OBJECTIVE IMPAIRMENTS: decreased balance, difficulty walking, and dizziness.   ACTIVITY LIMITATIONS: squatting, transfers, locomotion level, and quick turns with work activities  PARTICIPATION LIMITATIONS: community activity, occupation, and yard work  PERSONAL FACTORS: Behavior pattern, Past/current experiences, and Time since onset of injury/illness/exacerbation are also affecting patient's functional outcome.   REHAB POTENTIAL: Fair due to chronicity of problem and exact etiology of dizziness unknown  CLINICAL DECISION MAKING: Evolving/moderate complexity  EVALUATION COMPLEXITY: Moderate   PLAN:  PT FREQUENCY: 2x/week  PT DURATION: 4 weeks + eval  PLANNED INTERVENTIONS: [id number]-Therapeutic exercises, 97530- Therapeutic activity, [id number]- Neuromuscular re-education, [phone]- Self Care, [id number]- Gait training, Patient/Family education, and Vestibular training  PLAN FOR NEXT SESSION: cont balance/vestibular exercises   Shiraz Bastyr, Donavan Burnet, PT 04/04/2023, 4:22 PM

## 2023-04-04 ENCOUNTER — Encounter: Payer: Self-pay | Admitting: Physical Therapy

## 2023-04-05 ENCOUNTER — Ambulatory Visit: Payer: PPO | Admitting: Physical Therapy

## 2023-04-05 DIAGNOSIS — R2689 Other abnormalities of gait and mobility: Secondary | ICD-10-CM

## 2023-04-05 DIAGNOSIS — R42 Dizziness and giddiness: Secondary | ICD-10-CM | POA: Diagnosis not present

## 2023-04-05 DIAGNOSIS — R2681 Unsteadiness on feet: Secondary | ICD-10-CM

## 2023-04-06 ENCOUNTER — Encounter: Payer: Self-pay | Admitting: Physical Therapy

## 2023-04-06 NOTE — Therapy (Signed)
 OUTPATIENT PHYSICAL THERAPY VESTIBULAR TREATMENT NOTE     Patient Name: [name] MRN: [id number] DOB:1970/07/18, 53 y.o., female Today's Date: 04/06/2023   PCP: No PCP per pt  REFERRING PROVIDER: Dohmeier, Porfirio Mylar, MD  END OF SESSION:  PT End of Session - 04/06/23 1451     Visit Number 4    Number of Visits 9    Date for PT Re-Evaluation 04/27/23   extended 1 week due to schedule availability   Authorization Type HTA    Authorization Time Period 03-20-23 - 05-18-23    PT Start Time 1316    PT Stop Time 1400    PT Time Calculation (min) 44 min    Equipment Utilized During Treatment Gait belt    Activity Tolerance Patient tolerated treatment well    Behavior During Therapy WFL for tasks assessed/performed               Past Medical History:  Diagnosis Date   Achilles tendinitis    ADHD (attention deficit hyperactivity disorder), inattentive type    Diagnosed as an adult after starting college   Allergy    Arthritis    Arthritis    Ataxia    Back pain    Bipolar I disorder 04/18/2014   most recent episode mixed   Borderline personality disorder 10/27/2011   Cataracts, bilateral    Chest pain    Chronic pain disorder    due to several injuries affecting numerous areas of her body throughout the years   Chronic paroxysmal hemicrania, 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   rt chest wall nodule removal   GANGLION CYST EXCISION  2011   lipoma removal     right bunioectomy     SHOULDER SURGERY  01/13/2011   right, partial tear   tumor resection left thigh     Patient Active Problem List   Diagnosis Date Noted   Impaired functional mobility, balance, gait, and endurance 03/08/2023   Unexplained falls 03/08/2023   BPV (benign positional vertigo), right 03/08/2023   Other Specified Feeding or Eating Disorder, Emotional and Binge Eating Behaviors 01/15/2023   Pain in joint of right knee 01/15/2023   Other fatigue 09/24/2022   BMI 37.0-37.9, adult 03/14/2022   Shoulder subluxation, left, initial encounter 02/01/2022   Environmental allergies 01/31/2022   Class 3 severe obesity with serious comorbidity and  body mass index (BMI) of 40.0 to 44.9 in adult [facility]) 01/31/2022   Bipolar disorder (HCC) 11/28/2021   Arthritis of first metatarsophalangeal (MTP) joint of right foot 10/24/2021   Migraines 08/18/2021   Cervical radiculopathy 08/16/2021   Lipoma of left forearm 08/16/2021   Carpal tunnel syndrome on left 08/16/2021   Vitamin D deficiency 07/26/2021   Eating disorder 07/26/2021   Insulin resistance 07/06/2021   Other hyperlipidemia 07/06/2021   B12 deficiency 07/06/2021   Foot sprain, left, initial encounter 06/01/2021   Mood disorder (HCC) 04/29/2021   Sleep difficulties 04/29/2021   Other constipation 04/29/2021   Essential hypertension 04/29/2021   Sprain of ankle 03/29/2021   Subacromial bursitis of left shoulder joint 03/29/2021   Concussion with no loss of consciousness 02/07/2021   Lumbar  radiculopathy 02/07/2021   Shoulder impingement syndrome, left 11/01/2020   Sprain of metacarpophalangeal joint of left thumb 10/07/2020   Posterior tibial tendinitis of right lower extremity 09/13/2020   Labral tear of hip, degenerative 09/13/2020   Attention and concentration deficit 05/07/2020   Morbid obesity (HCC) 05/07/2020   Plantar wart 05/07/2020   Sleep apnea 05/07/2020   Urinary incontinence 05/07/2020   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   Genetic testing 02/27/2018   Family history of genetic disease carrier    Family history of breast cancer    Family history of colon cancer    History of multiple concussions 11/20/2017   Ataxia 11/20/2017   Cervical strain, acute, initial encounter 09/19/2016   Insomnia 02/17/2015   Right shoulder pain 12/31/2014   Episodic cluster headache, not intractable 12/02/2014   Chronic paroxysmal hemicrania, 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   Photophobia of both eyes 05/04/2014   Nausea with vomiting 05/04/2014   Bipolar I disorder, most recent episode mixed (HCC) 04/18/2014   Suicidal ideation 04/12/2014   Injury of right shoulder and upper arm 02/17/2014   Migraine with status migrainosus 01/08/2013   Chronic migraine 05/08/2012   Hypertension    Contact dermatitis 11/27/2011   Generalized anxiety disorder 10/27/2011   ADHD (attention deficit hyperactivity disorder), 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   Borderline personality disorder (HCC) 10/27/2011   Right foot pain 09/28/2011   Loss of transverse plantar arch 09/01/2011   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   Ganglion cyst 09/29/2009   Pes planus 07/01/2008     REFERRING DIAG: H81.11 (ICD-10-CM) - BPV (benign positional vertigo), right Z79.899 (ICD-10-CM) - Medication management G43.109 (ICD-10-CM) - Migraine with aura and without status migrainosus, not intractable R42 (ICD-10-CM) - Vertigo  THERAPY DIAG:   Unsteadiness on feet  Other abnormalities of gait and mobility  Dizziness and giddiness  ONSET DATE: January 2025:  Referral date   Rationale for Evaluation and Treatment: Rehabilitation  SUBJECTIVE:   SUBJECTIVE STATEMENT: Pt reports she had her initial eval for Rt knee this morning at [facility] - asks if she needs to use a cane; pt states her ortho MD had recommended use of cane in the note but did not verbally mention it to her - says her ortho PT told her to ask her vestibular PT;  pt informed that [facility] is not recommended at this time due to concern of it getting in her way more than being beneficial - also due to spatial awareness deficit contributing to balance problems.  Pt states she doesn't want to use SPC  - would give her too much to think about during ambulation Pt accompanied  by: self  PERTINENT HISTORY: Bipolar Disorder, ADHD, Achilles Tendinitis, Chronic Pain, Migraines, HIstory of Multiple Concussions, HTN, Insomnia   PAIN:  Are you having pain?  Rt knee pain due to ACL sx in May 2024 - currently wearing brace - weight bearing aggravates it  PRECAUTIONS: None  RED FLAGS: None   WEIGHT BEARING RESTRICTIONS: No  FALLS: Has patient fallen in last 6 months? Yes. Number of falls 3  LIVING ENVIRONMENT: Lives with: lives with their family Lives in: House/apartment Stairs: Yes: External: 3 steps; on right going up  PLOF: Independent  PATIENT GOALS: "be steadier and not fall"  OBJECTIVE: Note: Objective measures were completed at Evaluation unless otherwise noted.  DIAGNOSTIC FINDINGS: IMPRESSION:  (CT Head 01/24) - no imaging of head since this date No acute intracranial process.  COGNITION: Overall cognitive status: Within functional limits for tasks assessed - pt scored well on cognitive assessment administered by neurology   SENSATION: WFL  POSTURE:  rounded shoulders, forward head, and increased thoracic kyphosis  Cervical ROM:   WFL's   STRENGTH: WFL's - pt is wearing knee brace on Rt knee due to ACL sx in May '24  LOWER EXTREMITY MMT:  NT due to vertigo eval   BED MOBILITY:  Independent  TRANSFERS: Assistive device utilized: None  Sit to stand: Modified independence Stand to sit: Modified independence   GAIT: Gait pattern: [facility] Distance walked: 62' Assistive device utilized: None Level of assistance: Modified independence Comments: pt wearing brace on Rt knee due to s/p ACL sx in May 2024  VESTIBULAR ASSESSMENT:  GENERAL OBSERVATION: pt known to this PT and clinic with most recent previous admission Feb. - March 2023 for 10 visits for same diagnosis (dizziness and imbalance)   SYMPTOM BEHAVIOR:  Subjective history: pt reports she has had 2 episodes on vertigo at work in past 6 weeks in which she has lost her balance and fallen; exact etiology of vertigo is unknown - Dr. Vickey Huger gave her Epley to do which pt states it slightly helped for just a couple of days and then no improvement with dizziness returning  Non-Vestibular symptoms: headaches and migraine symptoms  Type of dizziness: Imbalance (Disequilibrium), Unsteady with head/body turns, and "Funny feeling in the head"  Frequency: varies - usually every other day - never goes away completely  Duration: varies - usually minutes for increased severity but dizziness is mostly constant  Aggravating factors: Induced by motion: activity in general, Worse with fatigue, Worse in the dark, Occurs when standing still , Moving eyes, and quick turns  Relieving factors: rest and slow movements  Progression of symptoms: worse  OCULOMOTOR EXAM:  Ocular Alignment: normal  Ocular ROM: No Limitations  Spontaneous Nystagmus: absent  Gaze-Induced Nystagmus: absent  Smooth Pursuits: intact; c/o dizziness (mild) with vertical pursuit testing  Saccades: intact  POSITIONAL TESTING: Right Sidelying: no nystagmus Left Sidelying: no nystagmus  MOTION  SENSITIVITY:  Motion Sensitivity Quotient Intensity: 0 = none, 1 = Lightheaded, 2 = Mild, 3 = Moderate, 4 = Severe, 5 = Vomiting  Intensity  1. Sitting to supine 0  2. Supine to L side 0  3. Supine to R side 0  4. Supine to sitting 1  5. L Hallpike-Dix   6. Up from L    7. R Hallpike-Dix   8. Up from R    9. Sitting, head tipped to L knee   10. Head up from L knee   11. Sitting, head tipped to R knee  12. Head up from R knee   13. Sitting head turns x5 2  14.Sitting head nods x5 2  15. In stance, 180 turn to L    16. In stance, 180 turn to R    FUNCTIONAL GAIT: MCTSIB: Condition 1: Avg of 3 trials: 30 sec, Condition 2: Avg of 3 trials: 30 sec, Condition 3: Avg of 3 trials: 30 sec, Condition 4: Avg of 3 trials: 30 sec, and Total Score: 120/120;  moderate postural sway on condition 4 but no LOB                                                                                                                             TREATMENT DATE: 04-05-23  NeuroRe-ed:  Performed visual exercises in standing - initially on floor, then progressed to standing on [employer] chart placed on pt's Lt side on plain wall and straight ahead on plain door - pt stood approx. 4' away from charts; read top line of chart/bottom line of 2nd chart for improved saccadic movement and gaze stabilization Progressed to reading [facility] charts on Lt side/Rt side for improved visual scanning with head turns/cervical rotation - read lines in various sequences and then specific colored letters on each [employer] chart   Pt stood on [employer] with feet together - EO for 30 secs:  EC for 30 secs  Balance on foam - EO for 10 secs , then with horizontal head turns 5 reps and vertical head turns 5 reps; with EC 10 sec hold without head turns: slow horizontal head turns 5 reps, then vertical head turns 5 reps with UE support on back of chair prn for assist with balance  Seated x2 viewing exercise for 20 secs  Pt performed x1 viewing in  seated position - target on patterned background (checkered cloth) - 30 secs for horizontal and 30 secs for vertical direction - 1 rep each  TherAct:  Pt performed ambulation with tossing/catching ball in hallway approx. 30'  2 reps; progressed to making circles CW and then CCW with ball with tracking ball with eyes/head movement - 30'x 1 rep each: amb. With ball held out in front at arm's length; focused on the number in front of her; progressed to amb. Tossing and catching ball with reading number on ball in front after it was caught  Pt performed oculomotor activity of picking up 7 squigers placed in various locations on checkered patterned cloth - walked to end of cloth, bent down to pick up each one, turned 180 degrees to bend down to place on opposite end of cloth - pt reported moderate dizziness after this activity    PATIENT EDUCATION: Education details:  x1 viewing added on 03-29-23  Person educated: Patient Education method: Explanation, Demonstration, and Handouts Education comprehension: verbalized understanding and returned demonstration  HOME EXERCISE PROGRAM:  balance on foam - EO and EC - feet apart  Access Code: [email or web address] URL: [email or web address] Date: 03/21/2023 Prepared by:  [facility]  Exercises - Standing on foam pad - Eyes open and then with Eyes closed   - 1 x daily - 7 x weekly - 3 sets - 10 reps  GOALS: Goals reviewed with patient? Yes  SHORT TERM GOALS: same as LTG's as ELOS = 4 weeks   LONG TERM GOALS: Target date: 04-27-23  Pt will improve DHI score by at least 10 points to demo reduced dizziness for improved quality of life. Baseline:  Goal status: INITIAL  2.  Pt will demo increased vestibular input in maintaining balance by standing with EC on foam with minimal to no postural sway.  Baseline:  Goal status: INITIAL  3.  Pt will amb. 67' with horizontal head turns without c/o dizziness and without LOB to increase safety with  ambulation for environmental scanning.  Baseline:  Goal status: INITIAL  4.  Pt will perform 3 turns to each side (180 degrees) without c/o dizziness to increase safety with work activities and for habituation.  Baseline:  Goal status: INITIAL  5.  Independent in HEP for balance and vestibular exercises. Baseline:  Goal status: INITIAL   ASSESSMENT:  CLINICAL IMPRESSION: PT session focused on vestibular/visual exercises to improve gaze stabilization and spatial awareness.  Pt reported moderate dizziness provoked with exercise of retrieving/picking up object on checkered pattern cloth with 180 degree turn for habituation and placing each one back down on cloth on floor.  Pt required seated rest break after this activity due to provocation of dizziness and also reported onset of headache which she stated may be partially due to change in weather and not just the activity.  Cont with POC.  OBJECTIVE IMPAIRMENTS: decreased balance, difficulty walking, and dizziness.   ACTIVITY LIMITATIONS: squatting, transfers, locomotion level, and quick turns with work activities  PARTICIPATION LIMITATIONS: community activity, occupation, and yard work  PERSONAL FACTORS: Behavior pattern, Past/current experiences, and Time since onset of injury/illness/exacerbation are also affecting patient's functional outcome.   REHAB POTENTIAL: Fair due to chronicity of problem and exact etiology of dizziness unknown  CLINICAL DECISION MAKING: Evolving/moderate complexity  EVALUATION COMPLEXITY: Moderate   PLAN:  PT FREQUENCY: 2x/week  PT DURATION: 4 weeks + eval  PLANNED INTERVENTIONS: [id number]-Therapeutic exercises, 97530- Therapeutic activity, [id number]- Neuromuscular re-education, [phone]- Self Care, [id number]- Gait training, Patient/Family education, and Vestibular training  PLAN FOR NEXT SESSION: cont balance/vestibular exercises   Tiffony Kite, Donavan Burnet, PT 04/06/2023, 2:54 PM

## 2023-04-09 ENCOUNTER — Ambulatory Visit: Payer: PPO | Admitting: Physical Therapy

## 2023-04-12 ENCOUNTER — Ambulatory Visit: Payer: PPO | Admitting: Physical Therapy

## 2023-04-12 DIAGNOSIS — R2689 Other abnormalities of gait and mobility: Secondary | ICD-10-CM

## 2023-04-12 DIAGNOSIS — R42 Dizziness and giddiness: Secondary | ICD-10-CM

## 2023-04-12 DIAGNOSIS — R2681 Unsteadiness on feet: Secondary | ICD-10-CM

## 2023-04-12 NOTE — Therapy (Unsigned)
 OUTPATIENT PHYSICAL THERAPY VESTIBULAR TREATMENT NOTE     Patient Name: [name] MRN: [id number] DOB:29-Jul-1970, 53 y.o., female Today's Date: 04/13/2023   PCP: No PCP per pt  REFERRING PROVIDER: Dohmeier, Porfirio Mylar, MD  END OF SESSION:  PT End of Session - 04/13/23 1222     Visit Number 5    Number of Visits 9    Date for PT Re-Evaluation 04/27/23   extended 1 week due to schedule availability   Authorization Type HTA    Authorization Time Period 03-20-23 - 05-18-23    PT Start Time 1050    PT Stop Time 1140    PT Time Calculation (min) 50 min    Equipment Utilized During Treatment Gait belt    Activity Tolerance Patient tolerated treatment well    Behavior During Therapy WFL for tasks assessed/performed                Past Medical History:  Diagnosis Date   Achilles tendinitis    ADHD (attention deficit hyperactivity disorder), inattentive type    Diagnosed as an adult after starting college   Allergy    Arthritis    Arthritis    Ataxia    Back pain    Bipolar I disorder 04/18/2014   most recent episode mixed   Borderline personality disorder 10/27/2011   Cataracts, bilateral    Chest pain    Chronic pain disorder    due to several injuries affecting numerous areas of her body throughout the years   Chronic paroxysmal hemicrania, 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   rt chest wall nodule removal   GANGLION CYST EXCISION  2011   lipoma removal     right bunioectomy     SHOULDER SURGERY  01/13/2011   right, partial tear   tumor resection left thigh     Patient Active Problem List   Diagnosis Date Noted   Impaired functional mobility, balance, gait, and endurance 03/08/2023   Unexplained falls 03/08/2023   BPV (benign positional vertigo), right 03/08/2023   Other Specified Feeding or Eating Disorder, Emotional and Binge Eating Behaviors 01/15/2023   Pain in joint of right knee 01/15/2023   Other fatigue 09/24/2022   BMI 37.0-37.9, adult 03/14/2022   Shoulder subluxation, left, initial encounter 02/01/2022   Environmental allergies 01/31/2022   Class 3 severe obesity with serious comorbidity  and body mass index (BMI) of 40.0 to 44.9 in adult [facility]) 01/31/2022   Bipolar disorder (HCC) 11/28/2021   Arthritis of first metatarsophalangeal (MTP) joint of right foot 10/24/2021   Migraines 08/18/2021   Cervical radiculopathy 08/16/2021   Lipoma of left forearm 08/16/2021   Carpal tunnel syndrome on left 08/16/2021   Vitamin D deficiency 07/26/2021   Eating disorder 07/26/2021   Insulin resistance 07/06/2021   Other hyperlipidemia 07/06/2021   B12 deficiency 07/06/2021   Foot sprain, left, initial encounter 06/01/2021   Mood disorder (HCC) 04/29/2021   Sleep difficulties 04/29/2021   Other constipation 04/29/2021   Essential hypertension 04/29/2021   Sprain of ankle 03/29/2021   Subacromial bursitis of left shoulder joint 03/29/2021   Concussion with no loss of consciousness 02/07/2021   Lumbar  radiculopathy 02/07/2021   Shoulder impingement syndrome, left 11/01/2020   Sprain of metacarpophalangeal joint of left thumb 10/07/2020   Posterior tibial tendinitis of right lower extremity 09/13/2020   Labral tear of hip, degenerative 09/13/2020   Attention and concentration deficit 05/07/2020   Morbid obesity (HCC) 05/07/2020   Plantar wart 05/07/2020   Sleep apnea 05/07/2020   Urinary incontinence 05/07/2020   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   Genetic testing 02/27/2018   Family history of genetic disease carrier    Family history of breast cancer    Family history of colon cancer    History of multiple concussions 11/20/2017   Ataxia 11/20/2017   Cervical strain, acute, initial encounter 09/19/2016   Insomnia 02/17/2015   Right shoulder pain 12/31/2014   Episodic cluster headache, not intractable 12/02/2014   Chronic paroxysmal hemicrania, 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   Photophobia of both eyes 05/04/2014   Nausea with vomiting 05/04/2014   Bipolar I disorder, most recent episode mixed (HCC) 04/18/2014   Suicidal ideation 04/12/2014   Injury of right shoulder and upper arm 02/17/2014   Migraine with status migrainosus 01/08/2013   Chronic migraine 05/08/2012   Hypertension    Contact dermatitis 11/27/2011   Generalized anxiety disorder 10/27/2011   ADHD (attention deficit hyperactivity disorder), 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   Borderline personality disorder (HCC) 10/27/2011   Right foot pain 09/28/2011   Loss of transverse plantar arch 09/01/2011   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   Ganglion cyst 09/29/2009   Pes planus 07/01/2008     REFERRING DIAG: H81.11 (ICD-10-CM) - BPV (benign positional vertigo), right Z79.899 (ICD-10-CM) - Medication management G43.109 (ICD-10-CM) - Migraine with aura and without status migrainosus, not intractable R42 (ICD-10-CM) - Vertigo  THERAPY DIAG:   Unsteadiness on feet  Other abnormalities of gait and mobility  Dizziness and giddiness  ONSET DATE: January 2025:  Referral date   Rationale for Evaluation and Treatment: Rehabilitation  SUBJECTIVE:   SUBJECTIVE STATEMENT: Pt reports she felt like the floor moved underneath her last night while she was working at [employer].  Pt says she was just standing up and folding clothes, was not moving around - just standing and reaching sideways but didn't do a lot of turning or moving.  Doesn't know why this sensation occurred.  States dizziness is about a 4/10 at this time- must make slow movements - just had PT at [facility]; says it has been a challenging past 18 hours - woke up around 4 AM and couldn't go back to sleep Pt accompanied by: self  PERTINENT HISTORY: Bipolar Disorder, ADHD, Achilles Tendinitis, Chronic Pain, Migraines, HIstory of Multiple  Concussions, HTN, Insomnia   PAIN:  Are you having pain?  Rt knee pain due to ACL sx in May 2024 - currently wearing brace - weight bearing aggravates it  PRECAUTIONS: None  RED FLAGS: None   WEIGHT BEARING RESTRICTIONS: No  FALLS: Has patient fallen in last 6 months? Yes. Number of falls 3  LIVING ENVIRONMENT: Lives with: lives with their family Lives in: House/apartment Stairs: Yes: External: 3 steps; on right going up  PLOF: Independent  PATIENT GOALS: "be steadier and not fall"  OBJECTIVE: Note: Objective measures were completed at Evaluation unless otherwise noted.  DIAGNOSTIC FINDINGS: IMPRESSION:  (CT Head 01/24) - no imaging of head since this date No acute intracranial process.  COGNITION: Overall cognitive status: Within functional limits for tasks assessed - pt scored well on cognitive assessment administered by neurology   SENSATION: WFL  POSTURE:  rounded shoulders, forward head, and increased thoracic kyphosis  Cervical ROM:  WFL's   STRENGTH: WFL's - pt is wearing knee brace on Rt knee due to ACL sx in  May '24  LOWER EXTREMITY MMT:  NT due to vertigo eval   BED MOBILITY:  Independent  TRANSFERS: Assistive device utilized: None  Sit to stand: Modified independence Stand to sit: Modified independence   GAIT: Gait pattern: [facility] Distance walked: 74' Assistive device utilized: None Level of assistance: Modified independence Comments: pt wearing brace on Rt knee due to s/p ACL sx in May 2024  VESTIBULAR ASSESSMENT:  GENERAL OBSERVATION: pt known to this PT and clinic with most recent previous admission Feb. - March 2023 for 10 visits for same diagnosis (dizziness and imbalance)   SYMPTOM BEHAVIOR:  Subjective history: pt reports she has had 2 episodes on vertigo at work in past 6 weeks in which she has lost her balance and fallen; exact etiology of vertigo is unknown - Dr. Vickey Huger gave her Epley to do which pt states it slightly helped for just a couple of days and then no improvement with dizziness returning  Non-Vestibular symptoms: headaches and migraine symptoms  Type of dizziness: Imbalance (Disequilibrium), Unsteady with head/body turns, and "Funny feeling in the head"  Frequency: varies - usually every other day - never goes away completely  Duration: varies - usually minutes for increased severity but dizziness is mostly constant  Aggravating factors: Induced by motion: activity in general, Worse with fatigue, Worse in the dark, Occurs when standing still , Moving eyes, and quick turns  Relieving factors: rest and slow movements  Progression of symptoms: worse  OCULOMOTOR EXAM:  Ocular Alignment: normal  Ocular ROM: No Limitations  Spontaneous Nystagmus: absent  Gaze-Induced Nystagmus: absent  Smooth Pursuits: intact; c/o dizziness (mild) with vertical pursuit testing  Saccades: intact  POSITIONAL TESTING: Right Sidelying: no nystagmus Left Sidelying: no nystagmus  MOTION SENSITIVITY:  Motion Sensitivity Quotient Intensity: 0 = none, 1 = Lightheaded, 2 = Mild, 3 =  Moderate, 4 = Severe, 5 = Vomiting  Intensity  1. Sitting to supine 0  2. Supine to L side 0  3. Supine to R side 0  4. Supine to sitting 1  5. L Hallpike-Dix   6. Up from L    7. R Hallpike-Dix   8. Up from R    9. Sitting, head tipped to L knee   10. Head up from L knee   11. Sitting, head tipped to R knee   12. Head up from R knee   13. Sitting head turns x5 2  14.Sitting  head nods x5 2  15. In stance, 180 turn to L    16. In stance, 180 turn to R    FUNCTIONAL GAIT: MCTSIB: Condition 1: Avg of 3 trials: 30 sec, Condition 2: Avg of 3 trials: 30 sec, Condition 3: Avg of 3 trials: 30 sec, Condition 4: Avg of 3 trials: 30 sec, and Total Score: 120/120;  moderate postural sway on condition 4 but no LOB                                                                                                                             TREATMENT DATE: 04-12-23  NeuroRe-ed:  Sit to stand from mat with feet on Airex - 3 reps EO:  5 reps EC with SBA - no UE support used   Performed visual exercises in standing - x2 viewing - performed standing on floor; progressed to amb. 30' x 2 reps performing x2 viewing with target on yellow paper  Pt stood on Airex with feet together - EO for 30 secs:  EC for 30 secs  Balance on rockerboard:  10 reps EO with minimal UE support;  EC with UE support 10 reps;  pt stood statically on board - performed horizontal head turns EO 5 reps;  vertical head turns 5 reps  Seated x1 viewing exercise for 30 secs horizontally with target on checkered patterned cloth background; vertical head turns x 30 secs with target on checkered cloth background - 1 rep each direction  Pt performed ambulation with tossing/catching ball in hallway approx. 30'  2 reps; progressed to making circles CW and then CCW with ball with tracking ball with eyes/head movement - 30'x 1 rep each: amb. With ball held out in front at arm's length; focused on the number in front of her; progressed to  amb. Tossing and catching ball with reading number on ball in front after it was caught  Pt performed oculomotor activity of picking up 6 squigers placed in various locations on checkered patterned cloth - walked to end of cloth, bent down to pick up each one, turned 180 degrees to bend down to place on opposite end of cloth - pt reported moderate dizziness after this activity  Pt stood on checkered cloth placed on blue mat for compliant surface training - turned 180 degrees x 2 reps with moderate c/o dizziness - CGA for recovery of LOB with turn  Pt amb. 30' x 1 rep - made circles with ball CW and then CCW 30' x 1 rep while amb. On flat, even surface - CGA to SBA    PATIENT EDUCATION: Education details:  x1 viewing added on 03-29-23  Person educated: Patient Education method: Explanation, Demonstration, and Handouts Education comprehension: verbalized understanding and returned demonstration  HOME EXERCISE PROGRAM:  balance on foam - EO and EC - feet apart  Access Code: [email or web address] URL: [email or web address] Date: 03/21/2023 Prepared by: Maebelle Munroe  Exercises - Standing on foam pad -  Eyes open and then with Eyes closed   - 1 x daily - 7 x weekly - 3 sets - 10 reps  GOALS: Goals reviewed with patient? Yes  SHORT TERM GOALS: same as LTG's as ELOS = 4 weeks   LONG TERM GOALS: Target date: 04-27-23  Pt will improve DHI score by at least 10 points to demo reduced dizziness for improved quality of life. Baseline:  Goal status: INITIAL  2.  Pt will demo increased vestibular input in maintaining balance by standing with EC on foam with minimal to no postural sway.  Baseline:  Goal status: INITIAL  3.  Pt will amb. 63' with horizontal head turns without c/o dizziness and without LOB to increase safety with ambulation for environmental scanning.  Baseline:  Goal status: INITIAL  4.  Pt will perform 3 turns to each side (180 degrees) without c/o dizziness to increase  safety with work activities and for habituation.  Baseline:  Goal status: INITIAL  5.  Independent in HEP for balance and vestibular exercises. Baseline:  Goal status: INITIAL   ASSESSMENT:  CLINICAL IMPRESSION: PT session focused on vestibular/visual exercises to improve gaze stabilization and balance with increased vestibular input.  Pt had postural sway with turning 180 degrees on blue mat and also with abrupt stop and turn.  Pt reported increased dizziness at start of today's session and during the session at intensity 4-5/10; pt attributes feeling of dysequilibrium which occurred during work shift last night contributing to c/o increased today.  Etiology of this episode and dizziness is unknown.  Cont with POC.  OBJECTIVE IMPAIRMENTS: decreased balance, difficulty walking, and dizziness.   ACTIVITY LIMITATIONS: squatting, transfers, locomotion level, and quick turns with work activities  PARTICIPATION LIMITATIONS: community activity, occupation, and yard work  PERSONAL FACTORS: Behavior pattern, Past/current experiences, and Time since onset of injury/illness/exacerbation are also affecting patient's functional outcome.   REHAB POTENTIAL: Fair due to chronicity of problem and exact etiology of dizziness unknown  CLINICAL DECISION MAKING: Evolving/moderate complexity  EVALUATION COMPLEXITY: Moderate   PLAN:  PT FREQUENCY: 2x/week  PT DURATION: 4 weeks + eval  PLANNED INTERVENTIONS: 97110-Therapeutic exercises, 97530- Therapeutic activity, [id number]- Neuromuscular re-education, [phone]- Self Care, [id number]- Gait training, Patient/Family education, and Vestibular training  PLAN FOR NEXT SESSION: cont balance/vestibular exercises   Milan Perkins, Donavan Burnet, PT 04/13/2023, 12:24 PM

## 2023-04-13 ENCOUNTER — Encounter: Payer: Self-pay | Admitting: Physical Therapy

## 2023-04-16 ENCOUNTER — Encounter (INDEPENDENT_AMBULATORY_CARE_PROVIDER_SITE_OTHER): Payer: Self-pay | Admitting: Physician Assistant

## 2023-04-16 ENCOUNTER — Ambulatory Visit (INDEPENDENT_AMBULATORY_CARE_PROVIDER_SITE_OTHER): Admitting: Physician Assistant

## 2023-04-16 VITALS — BP 107/71 | HR 69 | Temp 97.8°F | Ht 69.0 in | Wt 251.0 lb

## 2023-04-16 DIAGNOSIS — E559 Vitamin D deficiency, unspecified: Secondary | ICD-10-CM

## 2023-04-16 DIAGNOSIS — F5089 Other specified eating disorder: Secondary | ICD-10-CM

## 2023-04-16 DIAGNOSIS — E7849 Other hyperlipidemia: Secondary | ICD-10-CM | POA: Diagnosis not present

## 2023-04-16 DIAGNOSIS — Z6837 Body mass index (BMI) 37.0-37.9, adult: Secondary | ICD-10-CM

## 2023-04-16 DIAGNOSIS — E88819 Insulin resistance, unspecified: Secondary | ICD-10-CM | POA: Diagnosis not present

## 2023-04-16 NOTE — Progress Notes (Addendum)
 SUBJECTIVE: Discussed the use of AI scribe software for clinical note transcription with the patient, who gave verbal consent to proceed.  Chief Complaint: Obesity  Interim History: She has maintained her weight since last visit.  Down 19 lbs overall TBW loss of 7.04%  Dannah is here to discuss her progress with her obesity treatment plan. She is on the Category 2 Plan and states she is following her eating plan approximately 50 % of the time. She states she is exercising Physical therapy 45 minutes 2 times per week.  Katyana Trolinger Strausser is a 53 year old female who presents for follow-up of her obesity treatment plan.  She is adhering to her obesity treatment plan approximately fifty percent of the time. Despite a busy work schedule impacting her ability to consistently follow her diet plan, she has maintained her weight over the past few weeks. She is focusing on consuming proper foods and increasing her protein intake, including beef, [place], and fish. Additionally, she is working on increasing her water intake, especially on days off from work.  She is attending physical therapy twice a week for 45 minutes due to ongoing issues with her right knee, which is not strong enough. She is undergoing blood flow restriction therapy, which she finds painful and challenging, and experiences difficulty completing the required exercises due to pain and discomfort. She is also participating in vestibular therapy twice a week.  Her current medications include 5000 units of vitamin D daily. She is not taking B12 supplements as her levels are adequate from her diet. Recent lab results show normal electrolytes, kidney, and liver function. Her cholesterol is slightly elevated, but she is becoming more active. Her A1c is 5.3, and her insulin level is 7.3, which is slightly above the target of 5.  OBJECTIVE: Visit Diagnoses: Problem List Items Addressed This Visit     Morbid obesity (HCC)   Insulin resistance  - Primary   Other hyperlipidemia   Vitamin D deficiency   BMI 37.0-37.9, adult   Other Specified Feeding or Eating Disorder, Emotional and Binge Eating Behaviors  Obesity She is on a category two obesity treatment plan, adhering approximately 50% of the time. She has maintained her weight and gained 1.2 pounds of muscle mass. She is improving her diet, increasing protein intake, and attending physical therapy twice a week for her right knee, which aids her physical activity. Blood flow restriction therapy was not tolerated due to discomfort and improper cuff usage. Alternative exercises are being pursued to strengthen her knee. - Continue category two obesity treatment plan - Increase protein intake - Increase water intake, especially on days off - Continue physical therapy twice a week for right knee - Encourage meal planning and making better food choices - Avoid blood flow restriction therapy due to discomfort  Insulin Resistance Her insulin level is 7.3, with a target of 5 or less. Her A1c is 5.3, with in goal range. She is advised to limit simple carbohydrates to help lower her insulin level. Continue working on nutrition plan to decrease simple carbohydrates, increase lean proteins and exercise to promote weight loss, improve glycemic control and prevent progression to Type 2 diabetes. ]   Vitamin D Deficiency Her vitamin D level is maintained with a daily intake of 5000 units. Last vitamin D Lab Results  Component Value Date   VD25OH 53.2 03/06/2023   Low vitamin D levels can be associated with adiposity and may result in leptin resistance and weight gain. Also associated  with fatigue.  Currently on vitamin D supplementation without any adverse effects such as nausea, vomiting or muscle weakness.  - Continue taking 5000 units of vitamin D daily   Eating disorder/emotional eating Sabryna has had issues with stress/emotional eating. Currently this is moderately controlled.  Overall mood is stable. Medication(s): Other: Usual medications for mental health concerns  Plan: Continue Other: Usual mental health medications Discussed emotional eating strategies-   General Health Maintenance Electrolytes, kidney function, liver function, and CBC are normal. Cholesterol is slightly elevated, likely due to decreased activity, but she is becoming more active. B12 level is good, supported by a diet rich in B12. - Encourage continued physical activity - Monitor cholesterol levels  Follow-up She is scheduled to see her surgeon on April 29th for her knee and has a follow-up appointment on April 15th at 12:00 PM. - Follow-up with surgeon on April 29th - Follow-up appointment on April 15th at 12:00 PM  Vitals Temp: 97.8 F (36.6 C) BP: 107/71 Pulse Rate: 69 SpO2: 98 %   Anthropometric Measurements Height: 5\' 9"  (1.753 m) Weight: 251 lb (113.9 kg) BMI (Calculated): 37.05 Weight at Last Visit: 251 lb Weight Lost Since Last Visit: 0 Weight Gained Since Last Visit: 0 Starting Weight: 270 lb Total Weight Loss (lbs): 19 lb (8.618 kg) Peak Weight: 308 lb   Body Composition  Body Fat %: 49.2 % Fat Mass (lbs): 125 lbs Muscle Mass (lbs): 122.4 lbs Total Body Water (lbs): 91 lbs Visceral Fat Rating : 14   Other Clinical Data Fasting: yes Labs: no Today's Visit #: 31 Starting Date: 02/15/21 Comments: wears a 3 lb brace     ASSESSMENT AND PLAN:  Diet: Bellanie is currently in the action stage of change. As such, her goal is to continue with weight loss efforts and has agreed to the Category 2 Plan.   Exercise:  All adults should avoid inactivity. Some activity is better than none, and adults who participate in any amount of physical activity, gain some health benefits. and continue PT as instructed by Orthopedics  Behavior Modification:  We discussed the following Behavioral Modification Strategies today: increasing lean protein intake, decreasing  simple carbohydrates, increasing vegetables, increase H2O intake, increase high fiber foods, meal planning and cooking strategies, better snacking choices, emotional eating strategies , avoiding temptations, and planning for success. We discussed various medication options to help Juletta with her weight loss efforts and we both agreed to continue to work on nutritional and behavioral strategies to promote weight loss.  .  Return in about 4 weeks (around 05/14/2023).Marland Kitchen She was informed of the importance of frequent follow up visits to maximize her success with intensive lifestyle modifications for her multiple health conditions.  Attestation Statements:   Reviewed by clinician on day of visit: allergies, medications, problem list, medical history, surgical history, family history, social history, and previous encounter notes.   Time spent on visit including pre-visit chart review and post-visit care and charting was 30 minutes  Selvin Yun,PA-C

## 2023-04-17 ENCOUNTER — Telehealth (INDEPENDENT_AMBULATORY_CARE_PROVIDER_SITE_OTHER): Admitting: Psychology

## 2023-04-17 ENCOUNTER — Encounter: Payer: Self-pay | Admitting: Neurology

## 2023-04-24 ENCOUNTER — Ambulatory Visit: Payer: PPO | Admitting: Physical Therapy

## 2023-04-24 DIAGNOSIS — R42 Dizziness and giddiness: Secondary | ICD-10-CM

## 2023-04-24 DIAGNOSIS — R2681 Unsteadiness on feet: Secondary | ICD-10-CM

## 2023-04-24 NOTE — Therapy (Unsigned)
 OUTPATIENT PHYSICAL THERAPY VESTIBULAR TREATMENT NOTE     Patient Name: [name] MRN: [id number] DOB:Jun 15, 1970, 53 y.o., female Today's Date: 04/25/2023   PCP: No PCP per pt  REFERRING PROVIDER: Dohmeier, Porfirio Mylar, MD  END OF SESSION:  PT End of Session - 04/25/23 1736     Visit Number 6    Number of Visits 9    Date for PT Re-Evaluation 04/27/23   extended 1 week due to schedule availability   Authorization Type HTA    Authorization Time Period 03-20-23 - 05-18-23    PT Start Time 1403    PT Stop Time 1450    PT Time Calculation (min) 47 min    Equipment Utilized During Treatment --    Activity Tolerance Patient tolerated treatment well    Behavior During Therapy [facility] for tasks assessed/performed                 Past Medical History:  Diagnosis Date   Achilles tendinitis    ADHD (attention deficit hyperactivity disorder), inattentive type    Diagnosed as an adult after starting college   Allergy    Arthritis    Arthritis    Ataxia    Back pain    Bipolar I disorder 04/18/2014   most recent episode mixed   Borderline personality disorder 10/27/2011   Cataracts, bilateral    Chest pain    Chronic pain disorder    due to several injuries affecting numerous areas of her body throughout the years   Chronic paroxysmal hemicrania, 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   rt chest wall nodule removal   GANGLION CYST EXCISION  2011   lipoma removal     right bunioectomy     SHOULDER SURGERY  01/13/2011   right, partial tear   tumor resection left thigh     Patient Active Problem List   Diagnosis Date Noted   Impaired functional mobility, balance, gait, and endurance 03/08/2023   Unexplained falls 03/08/2023   BPV (benign positional vertigo), right 03/08/2023   Other Specified Feeding or Eating Disorder, Emotional and Binge Eating Behaviors 01/15/2023   Pain in joint of right knee 01/15/2023   Other fatigue 09/24/2022   BMI 37.0-37.9, adult 03/14/2022   Shoulder subluxation, left, initial encounter 02/01/2022   Environmental allergies 01/31/2022   Class 3 severe obesity with serious comorbidity  and body mass index (BMI) of 40.0 to 44.9 in adult [facility]) 01/31/2022   Bipolar disorder (HCC) 11/28/2021   Arthritis of first metatarsophalangeal (MTP) joint of right foot 10/24/2021   Migraines 08/18/2021   Cervical radiculopathy 08/16/2021   Lipoma of left forearm 08/16/2021   Carpal tunnel syndrome on left 08/16/2021   Vitamin D deficiency 07/26/2021   Eating disorder 07/26/2021   Insulin resistance 07/06/2021   Other hyperlipidemia 07/06/2021   B12 deficiency 07/06/2021   Foot sprain, left, initial encounter 06/01/2021   Mood disorder (HCC) 04/29/2021   Sleep difficulties 04/29/2021   Other constipation 04/29/2021   Essential hypertension 04/29/2021   Sprain of ankle 03/29/2021   Subacromial bursitis of left shoulder joint 03/29/2021   Concussion with no loss of consciousness 02/07/2021   Lumbar  radiculopathy 02/07/2021   Shoulder impingement syndrome, left 11/01/2020   Sprain of metacarpophalangeal joint of left thumb 10/07/2020   Posterior tibial tendinitis of right lower extremity 09/13/2020   Labral tear of hip, degenerative 09/13/2020   Attention and concentration deficit 05/07/2020   Morbid obesity (HCC) 05/07/2020   Plantar wart 05/07/2020   Sleep apnea 05/07/2020   Urinary incontinence 05/07/2020   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   Genetic testing 02/27/2018   Family history of genetic disease carrier    Family history of breast cancer    Family history of colon cancer    History of multiple concussions 11/20/2017   Ataxia 11/20/2017   Cervical strain, acute, initial encounter 09/19/2016   Insomnia 02/17/2015   Right shoulder pain 12/31/2014   Episodic cluster headache, not intractable 12/02/2014   Chronic paroxysmal hemicrania, 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   Photophobia of both eyes 05/04/2014   Nausea with vomiting 05/04/2014   Bipolar I disorder, most recent episode mixed (HCC) 04/18/2014   Suicidal ideation 04/12/2014   Injury of right shoulder and upper arm 02/17/2014   Migraine with status migrainosus 01/08/2013   Chronic migraine 05/08/2012   Hypertension    Contact dermatitis 11/27/2011   Generalized anxiety disorder 10/27/2011   ADHD (attention deficit hyperactivity disorder), 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   Borderline personality disorder (HCC) 10/27/2011   Right foot pain 09/28/2011   Loss of transverse plantar arch 09/01/2011   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   Ganglion cyst 09/29/2009   Pes planus 07/01/2008     REFERRING DIAG: H81.11 (ICD-10-CM) - BPV (benign positional vertigo), right Z79.899 (ICD-10-CM) - Medication management G43.109 (ICD-10-CM) - Migraine with aura and without status migrainosus, not intractable R42 (ICD-10-CM) - Vertigo  THERAPY DIAG:   Unsteadiness on feet  Dizziness and giddiness  ONSET DATE: January 2025:  Referral date   Rationale for Evaluation and Treatment: Rehabilitation  SUBJECTIVE:   SUBJECTIVE STATEMENT: Pt reports she is having pain/pressure on back of her head - states her head hurts when she lays her head on her pillow; reports no dizziness at this time - but did a lot in PT for her knee today.  Pt reports she had "another elevator drop sensation" the other night when she was sitting playing a game on her phone Pt accompanied by: self  PERTINENT HISTORY: Bipolar Disorder, ADHD, Achilles Tendinitis, Chronic Pain, Migraines, HIstory of Multiple Concussions, HTN, Insomnia   PAIN:  Are you having pain?  Rt knee pain due to ACL sx in May 2024 - currently wearing brace - weight bearing aggravates it  PRECAUTIONS: None  RED FLAGS: None  WEIGHT BEARING RESTRICTIONS: No  FALLS: Has patient fallen in last 6 months? Yes. Number of falls 3  LIVING ENVIRONMENT: Lives with: lives with their family Lives in: House/apartment Stairs: Yes: External: 3 steps; on right going up  PLOF: Independent  PATIENT GOALS: "be steadier and not fall"  OBJECTIVE: Note: Objective measures were completed at Evaluation unless otherwise noted.  DIAGNOSTIC FINDINGS: IMPRESSION:  (CT Head 01/24) - no imaging of head since this date No acute intracranial process.  COGNITION: Overall cognitive status: Within functional limits for tasks assessed - pt scored well on cognitive assessment administered by neurology   SENSATION: WFL  POSTURE:  rounded shoulders, forward head, and increased thoracic kyphosis  Cervical ROM:  WFL's   STRENGTH: WFL's - pt is wearing knee brace on Rt knee due to ACL sx in May '24  LOWER EXTREMITY MMT:  NT due to vertigo eval   BED MOBILITY:  Independent  TRANSFERS: Assistive device utilized: None  Sit to stand: Modified independence Stand to sit: Modified independence   GAIT: Gait  pattern: [facility] Distance walked: 58' Assistive device utilized: None Level of assistance: Modified independence Comments: pt wearing brace on Rt knee due to s/p ACL sx in May 2024  VESTIBULAR ASSESSMENT:  GENERAL OBSERVATION: pt known to this PT and clinic with most recent previous admission Feb. - March 2023 for 10 visits for same diagnosis (dizziness and imbalance)   SYMPTOM BEHAVIOR:  Subjective history: pt reports she has had 2 episodes on vertigo at work in past 6 weeks in which she has lost her balance and fallen; exact etiology of vertigo is unknown - Dr. Vickey Huger gave her Epley to do which pt states it slightly helped for just a couple of days and then no improvement with dizziness returning  Non-Vestibular symptoms: headaches and migraine symptoms  Type of dizziness: Imbalance (Disequilibrium), Unsteady with head/body turns, and "Funny feeling in the head"  Frequency: varies - usually every other day - never goes away completely  Duration: varies - usually minutes for increased severity but dizziness is mostly constant  Aggravating factors: Induced by motion: activity in general, Worse with fatigue, Worse in the dark, Occurs when standing still , Moving eyes, and quick turns  Relieving factors: rest and slow movements  Progression of symptoms: worse  OCULOMOTOR EXAM:  Ocular Alignment: normal  Ocular ROM: No Limitations  Spontaneous Nystagmus: absent  Gaze-Induced Nystagmus: absent  Smooth Pursuits: intact; c/o dizziness (mild) with vertical pursuit testing  Saccades: intact  POSITIONAL TESTING: Right Sidelying: no nystagmus Left Sidelying: no nystagmus  MOTION SENSITIVITY:  Motion Sensitivity Quotient Intensity: 0 = none, 1 = Lightheaded, 2 = Mild, 3 = Moderate, 4 = Severe, 5 = Vomiting  Intensity  1. Sitting to supine 0  2. Supine to L side 0  3. Supine to R side 0  4. Supine to sitting 1  5. L Hallpike-Dix   6. Up from L    7. R Hallpike-Dix   8. Up from R    9.  Sitting, head tipped to L knee   10. Head up from L knee   11. Sitting, head tipped to R knee   12. Head up from R knee   13. Sitting head turns x5 2  14.Sitting head nods x5 2  15. In stance, 180 turn to L    16. In stance, 180 turn to R    FUNCTIONAL GAIT: MCTSIB: Condition 1: Avg of 3 trials: 30 sec, Condition 2: Avg of  3 trials: 30 sec, Condition 3: Avg of 3 trials: 30 sec, Condition 4: Avg of 3 trials: 30 sec, and Total Score: 120/120;  moderate postural sway on condition 4 but no LOB                                                                                                                             TREATMENT DATE: 04-24-23  NeuroRe-ed:  Corner balance exercises standing on Airex - feet apart - pt performed head turns EO horizontal 5 reps; vertical head turns EO 5 reps; pt then performed head turns with EC - horizontal and vertical 5 reps each direction with CGA  Pt stood on Airex - used colored [employer] - moved chart around in various directions - horizontally, verticall and diagonally with pt reading specified lines (top/bottom) and then read numbers of called colors - performed with eyes moving only and also with head/eyes moving  Placed [employer] chart on 1 wall and black/white [place] chart on opposite wall - pt stood on blue mat - read line on one chart, turned 180 degrees and read line on other [place] chart; progressed to amb. Toward each Ed[employer]a [employer] chart reading lines or letters of called color  X1 viewing exercise with target on patterned background (posture grid) - 30 secs x 2 reps - horizontal and vertical - pt had no major increase in dizziness after this exercise  Balance on rockerboard:  10 reps EO with minimal UE support;  EC with UE support prn - 10 reps  Pt performed amb. Around 4 cones (fig. 8's) placed on checkered patterned cloth on floor - 3 reps with SBA  Pt performed oculomotor activity of picking up 4 cones placed in various locations on checkered patterned  cloth - walked to end of cloth, bent down to pick up each one, turned 180 degrees to bend down to place on opposite end of cloth - pt reported moderate dizziness after this activity       PATIENT EDUCATION: Education details:  x1 viewing added on 03-29-23  Person educated: Patient Education method: Explanation, Demonstration, and Handouts Education comprehension: verbalized understanding and returned demonstration  HOME EXERCISE PROGRAM:  balance on foam - EO and EC - feet apart  Access Code: [email or web address] URL: [email or web address] Date: 03/21/2023 Prepared by: Maebelle Munroe  Exercises - Standing on foam pad - Eyes open and then with Eyes closed   - 1 x daily - 7 x weekly - 3 sets - 10 reps  GOALS: Goals reviewed with patient? Yes  SHORT TERM GOALS: same as LTG's as ELOS = 4 weeks   LONG TERM GOALS: Target date: 04-27-23  Pt will improve DHI score by at least 10 points to demo reduced dizziness for improved quality of life. Baseline:  Goal status: INITIAL  2.  Pt will demo increased vestibular input in maintaining balance by standing with EC on foam with minimal to no postural sway.  Baseline:  Goal  status: INITIAL  3.  Pt will amb. 61' with horizontal head turns without c/o dizziness and without LOB to increase safety with ambulation for environmental scanning.  Baseline:  Goal status: INITIAL  4.  Pt will perform 3 turns to each side (180 degrees) without c/o dizziness to increase safety with work activities and for habituation.  Baseline:  Goal status: INITIAL  5.  Independent in HEP for balance and vestibular exercises. Baseline:  Goal status: INITIAL   ASSESSMENT:  CLINICAL IMPRESSION: PT session focused on visual and vestibular exercises to improve balance with increased vestibular input and visual exercises to improve gaze stabilization and spatial awareness.  Pt had moderate postural instability with c/o dizziness after performing figure 8's  around cones on checkered cloth on floor; pt appears to have > c/o dizziness with activities requiring increased visual input/focus and gaze stabilization.  Pt also reported headache (moderate intensity) in back of head at end of session.  Cont with POC.  OBJECTIVE IMPAIRMENTS: decreased balance, difficulty walking, and dizziness.   ACTIVITY LIMITATIONS: squatting, transfers, locomotion level, and quick turns with work activities  PARTICIPATION LIMITATIONS: community activity, occupation, and yard work  PERSONAL FACTORS: Behavior pattern, Past/current experiences, and Time since onset of injury/illness/exacerbation are also affecting patient's functional outcome.   REHAB POTENTIAL: Fair due to chronicity of problem and exact etiology of dizziness unknown  CLINICAL DECISION MAKING: Evolving/moderate complexity  EVALUATION COMPLEXITY: Moderate   PLAN:  PT FREQUENCY: 2x/week  PT DURATION: 4 weeks + eval  PLANNED INTERVENTIONS: 97110-Therapeutic exercises, 97530- Therapeutic activity, [id number]- Neuromuscular re-education, [phone]- Self Care, [id number]- Gait training, Patient/Family education, and Vestibular training  PLAN FOR NEXT SESSION: cont balance/vestibular exercises   Lecretia Buczek, Donavan Burnet, PT 04/25/2023, 5:39 PM

## 2023-04-25 ENCOUNTER — Encounter: Payer: Self-pay | Admitting: Physical Therapy

## 2023-04-26 ENCOUNTER — Ambulatory Visit: Payer: PPO | Admitting: Physical Therapy

## 2023-04-26 DIAGNOSIS — R42 Dizziness and giddiness: Secondary | ICD-10-CM

## 2023-04-26 DIAGNOSIS — R2681 Unsteadiness on feet: Secondary | ICD-10-CM

## 2023-04-27 ENCOUNTER — Other Ambulatory Visit: Payer: Self-pay | Admitting: Adult Health

## 2023-04-27 ENCOUNTER — Other Ambulatory Visit: Payer: Self-pay

## 2023-04-27 ENCOUNTER — Encounter: Payer: Self-pay | Admitting: Physical Therapy

## 2023-04-27 NOTE — Therapy (Signed)
 OUTPATIENT PHYSICAL THERAPY VESTIBULAR TREATMENT NOTE     Patient Name: [name] MRN: [id number] DOB:01/30/1971, 53 y.o., female Today's Date: 04/27/2023   PCP: No PCP per pt  REFERRING PROVIDER: Dohmeier, Porfirio Mylar, MD  END OF SESSION:  PT End of Session - 04/27/23 0947     Visit Number 7    Number of Visits 9    Date for PT Re-Evaluation 04/27/23   extended 1 week due to schedule availability   Authorization Type HTA    Authorization Time Period 03-20-23 - 05-18-23    PT Start Time 1400    PT Stop Time 1447    PT Time Calculation (min) 47 min    Equipment Utilized During Treatment Gait belt    Activity Tolerance Patient tolerated treatment well    Behavior During Therapy WFL for tasks assessed/performed                 Past Medical History:  Diagnosis Date   Achilles tendinitis    ADHD (attention deficit hyperactivity disorder), inattentive type    Diagnosed as an adult after starting college   Allergy    Arthritis    Arthritis    Ataxia    Back pain    Bipolar I disorder 04/18/2014   most recent episode mixed   Borderline personality disorder 10/27/2011   Cataracts, bilateral    Chest pain    Chronic pain disorder    due to several injuries affecting numerous areas of her body throughout the years   Chronic paroxysmal hemicrania, 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   rt chest wall nodule removal   GANGLION CYST EXCISION  2011   lipoma removal     right bunioectomy     SHOULDER SURGERY  01/13/2011   right, partial tear   tumor resection left thigh     Patient Active Problem List   Diagnosis Date Noted   Impaired functional mobility, balance, gait, and endurance 03/08/2023   Unexplained falls 03/08/2023   BPV (benign positional vertigo), right 03/08/2023   Other Specified Feeding or Eating Disorder, Emotional and Binge Eating Behaviors 01/15/2023   Pain in joint of right knee 01/15/2023   Other fatigue 09/24/2022   BMI 37.0-37.9, adult 03/14/2022   Shoulder subluxation, left, initial encounter 02/01/2022   Environmental allergies 01/31/2022   Class 3 severe obesity with serious  comorbidity and body mass index (BMI) of 40.0 to 44.9 in adult [facility]) 01/31/2022   Bipolar disorder (HCC) 11/28/2021   Arthritis of first metatarsophalangeal (MTP) joint of right foot 10/24/2021   Migraines 08/18/2021   Cervical radiculopathy 08/16/2021   Lipoma of left forearm 08/16/2021   Carpal tunnel syndrome on left 08/16/2021   Vitamin D deficiency 07/26/2021   Eating disorder 07/26/2021   Insulin resistance 07/06/2021   Other hyperlipidemia 07/06/2021   B12 deficiency 07/06/2021   Foot sprain, left, initial encounter 06/01/2021   Mood disorder (HCC) 04/29/2021   Sleep difficulties 04/29/2021   Other constipation 04/29/2021   Essential hypertension 04/29/2021   Sprain of ankle 03/29/2021   Subacromial bursitis of left shoulder joint 03/29/2021   Concussion with no loss of consciousness 02/07/2021   Lumbar  radiculopathy 02/07/2021   Shoulder impingement syndrome, left 11/01/2020   Sprain of metacarpophalangeal joint of left thumb 10/07/2020   Posterior tibial tendinitis of right lower extremity 09/13/2020   Labral tear of hip, degenerative 09/13/2020   Attention and concentration deficit 05/07/2020   Morbid obesity (HCC) 05/07/2020   Plantar wart 05/07/2020   Sleep apnea 05/07/2020   Urinary incontinence 05/07/2020   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   Genetic testing 02/27/2018   Family history of genetic disease carrier    Family history of breast cancer    Family history of colon cancer    History of multiple concussions 11/20/2017   Ataxia 11/20/2017   Cervical strain, acute, initial encounter 09/19/2016   Insomnia 02/17/2015   Right shoulder pain 12/31/2014   Episodic cluster headache, not intractable 12/02/2014   Chronic paroxysmal hemicrania, 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   Photophobia of both eyes 05/04/2014   Nausea with vomiting 05/04/2014   Bipolar I disorder, most recent episode mixed (HCC) 04/18/2014   Suicidal ideation 04/12/2014   Injury of right shoulder and upper arm 02/17/2014   Migraine with status migrainosus 01/08/2013   Chronic migraine 05/08/2012   Hypertension    Contact dermatitis 11/27/2011   Generalized anxiety disorder 10/27/2011   ADHD (attention deficit hyperactivity disorder), 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   Borderline personality disorder (HCC) 10/27/2011   Right foot pain 09/28/2011   Loss of transverse plantar arch 09/01/2011   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   Ganglion cyst 09/29/2009   Pes planus 07/01/2008     REFERRING DIAG: H81.11 (ICD-10-CM) - BPV (benign positional vertigo), right Z79.899 (ICD-10-CM) - Medication management G43.109 (ICD-10-CM) - Migraine with aura and without status migrainosus, not intractable R42 (ICD-10-CM) - Vertigo  THERAPY DIAG:   Unsteadiness on feet  Dizziness and giddiness  ONSET DATE: January 2025:  Referral date   Rationale for Evaluation and Treatment: Rehabilitation  SUBJECTIVE:   SUBJECTIVE STATEMENT: Pt reports no new changes or problems since previous PT session; states she was told by nurse at [facility] that the pain/pressure she feels on back of head when she lies down could be sinus pressure. Pt verbalizes understanding and agreement with plan to D/C next week Pt accompanied by: self  PERTINENT HISTORY: Bipolar Disorder, ADHD, Achilles Tendinitis, Chronic Pain, Migraines, HIstory of Multiple Concussions, HTN, Insomnia   PAIN:  Are you having pain?  Rt knee pain due to ACL sx in May 2024 - currently wearing brace - weight bearing aggravates it  PRECAUTIONS: None  RED FLAGS: None   WEIGHT BEARING RESTRICTIONS: No  FALLS: Has patient fallen in last 6 months? Yes. Number  of falls 3  LIVING ENVIRONMENT: Lives with: lives with their family Lives in: House/apartment Stairs: Yes: External: 3 steps; on right going up  PLOF: Independent  PATIENT GOALS: "be steadier and not fall"  OBJECTIVE: Note: Objective measures were completed at Evaluation unless otherwise noted.  DIAGNOSTIC FINDINGS: IMPRESSION:  (CT Head 01/24) - no imaging of head since this date No acute intracranial process.  COGNITION: Overall cognitive status: Within functional limits for tasks assessed - pt scored well on cognitive assessment administered by neurology   SENSATION: WFL  POSTURE:  rounded shoulders, forward head, and increased thoracic kyphosis  Cervical ROM:  WFL's   STRENGTH: WFL's - pt is wearing knee brace on Rt knee due to ACL sx in May '24  LOWER EXTREMITY MMT:  NT due to vertigo eval   BED MOBILITY:  Independent  TRANSFERS: Assistive device utilized: None  Sit to stand: Modified independence Stand to sit: Modified independence   GAIT: Gait pattern: [facility] Distance walked: 67' Assistive device  utilized: None Level of assistance: Modified independence Comments: pt wearing brace on Rt knee due to s/p ACL sx in May 2024  VESTIBULAR ASSESSMENT:  GENERAL OBSERVATION: pt known to this PT and clinic with most recent previous admission Feb. - March 2023 for 10 visits for same diagnosis (dizziness and imbalance)   SYMPTOM BEHAVIOR:  Subjective history: pt reports she has had 2 episodes on vertigo at work in past 6 weeks in which she has lost her balance and fallen; exact etiology of vertigo is unknown - Dr. Vickey Huger gave her Epley to do which pt states it slightly helped for just a couple of days and then no improvement with dizziness returning  Non-Vestibular symptoms: headaches and migraine symptoms  Type of dizziness: Imbalance (Disequilibrium), Unsteady with head/body turns, and "Funny feeling in the head"  Frequency: varies - usually every other day - never goes away completely  Duration: varies - usually minutes for increased severity but dizziness is mostly constant  Aggravating factors: Induced by motion: activity in general, Worse with fatigue, Worse in the dark, Occurs when standing still , Moving eyes, and quick turns  Relieving factors: rest and slow movements  Progression of symptoms: worse  OCULOMOTOR EXAM:  Ocular Alignment: normal  Ocular ROM: No Limitations  Spontaneous Nystagmus: absent  Gaze-Induced Nystagmus: absent  Smooth Pursuits: intact; c/o dizziness (mild) with vertical pursuit testing  Saccades: intact  POSITIONAL TESTING: Right Sidelying: no nystagmus Left Sidelying: no nystagmus  MOTION SENSITIVITY:  Motion Sensitivity Quotient Intensity: 0 = none, 1 = Lightheaded, 2 = Mild, 3 = Moderate, 4 = Severe, 5 = Vomiting  Intensity  1. Sitting to supine 0  2. Supine to L side 0  3. Supine to R side 0  4. Supine to sitting 1  5. L Hallpike-Dix   6. Up from L    7. R Hallpike-Dix   8. Up from R    9. Sitting, head tipped to L knee   10. Head up from L  knee   11. Sitting, head tipped to R knee   12. Head up from R knee   13. Sitting head turns x5 2  14.Sitting head nods x5 2  15. In stance, 180 turn to L    16. In stance, 180 turn to R    FUNCTIONAL GAIT: MCTSIB: Condition 1: Avg of 3 trials: 30 sec, Condition 2: Avg of 3 trials: 30 sec, Condition 3: Avg of 3 trials: 30 sec, Condition 4: Avg  of 3 trials: 30 sec, and Total Score: 120/120;  moderate postural sway on condition 4 but no LOB                                                                                                                             TREATMENT DATE: 04-26-23  NeuroRe-ed:  Corner balance exercises standing on Airex -  pt held ball - performed visual tracking with eyes and head moving as ball moved in CW circular direction and then in CCW circular direction - 5 reps each; min. Increase in dizziness reported upon completion of this exercise  Marching on Airex - EO - no head turns -10 reps each leg; added horizontal head turns 5 reps EO and then vertical head turns EO 5 reps with CGA for balance recovery;  marching EC 5 reps no head turns  Placed [employer] chart on 1 wall and black/white [place] chart on opposite wall - pt stood on Airex - read line on one chart and then horizontal head turn to other side to read line on other [employer]; also read letters of named color for increased visual scanning/gaze stabilization  Balance on rockerboard:  10 reps EO with minimal UE support;  EC with UE support prn - 10 reps; pt held board steady - performed horizontal head turns 5 reps with EO and then vertical head turns with EO  Pt performed amb. Around 4 cones (fig. 8's) placed on checkered patterned cloth on floor - 2 laps with SBA  Pt performed oculomotor activity of picking up 4 cones placed in various locations on checkered patterned cloth - walked to end of cloth, bent down to pick up each one, turned 180 degrees to bend down to place on opposite end of cloth - pt  reported minimal dizziness upon completion of this activity   Access Code: [email or web address] URL: [email or web address] Date: 04/27/2023 Prepared by: Maebelle Munroe  Exercises - Standing on foam pad - Eyes open and then with Eyes closed   - 1 x daily - 7 x weekly - 3 sets - 10 reps - Standing Gaze Stabilization with Head Nod  - 1 x daily - 7 x weekly - 1 sets - 1 reps - 60 secs hold    PATIENT EDUCATION: Education details: reissued HEP per pt's request - [employer] [email or web address] Person educated: Patient Education method: Explanation, Demonstration, and Handouts Education comprehension: verbalized understanding and returned demonstration  HOME EXERCISE PROGRAM:  balance on foam - EO and EC - feet apart  Access Code: [email or web address] URL: [email or web address] Date: 03/21/2023 Prepared by: Maebelle Munroe  Exercises - Standing on foam pad - Eyes open and then with Eyes closed   - 1 x daily - 7 x weekly - 3 sets - 10 reps - gaze stabilization  GOALS: Goals reviewed with patient? Yes  SHORT TERM GOALS: same as LTG's as ELOS = 4 weeks   LONG TERM GOALS: Target date: 04-27-23  Pt will improve  DHI score by at least 10 points to demo reduced dizziness for improved quality of life. Baseline:  Goal status: INITIAL  2.  Pt will demo increased vestibular input in maintaining balance by standing with EC on foam with minimal to no postural sway.  Baseline:  Goal status: INITIAL  3.  Pt will amb. 66' with horizontal head turns without c/o dizziness and without LOB to increase safety with ambulation for environmental scanning.  Baseline:  Goal status: INITIAL  4.  Pt will perform 3 turns to each side (180 degrees) without c/o dizziness to increase safety with work activities and for habituation.  Baseline:  Goal status: INITIAL  5.  Independent in HEP for balance and vestibular exercises. Baseline:  Goal status: INITIAL   ASSESSMENT:  CLINICAL IMPRESSION: PT session  focused on visual and vestibular exercises to improve balance and gaze stabilization with dynamic movement.  Pt reported slightly less dizziness provoked with activities in today's session compared to that in previous session this week.  Pt also had improvement in balance with turning 180 degrees with slightly less postural sway noted upon completion of turn.  Pt did not c/o HA at end of today's session as she did at completion of previous PT session this week.   Plan D/C next week.  Cont with POC.  OBJECTIVE IMPAIRMENTS: decreased balance, difficulty walking, and dizziness.   ACTIVITY LIMITATIONS: squatting, transfers, locomotion level, and quick turns with work activities  PARTICIPATION LIMITATIONS: community activity, occupation, and yard work  PERSONAL FACTORS: Behavior pattern, Past/current experiences, and Time since onset of injury/illness/exacerbation are also affecting patient's functional outcome.   REHAB POTENTIAL: Fair due to chronicity of problem and exact etiology of dizziness unknown  CLINICAL DECISION MAKING: Evolving/moderate complexity  EVALUATION COMPLEXITY: Moderate   PLAN:  PT FREQUENCY: 2x/week  PT DURATION: 4 weeks + eval  PLANNED INTERVENTIONS: 97110-Therapeutic exercises, 97530- Therapeutic activity, [id number]- Neuromuscular re-education, [phone]- Self Care, [id number]- Gait training, Patient/Family education, and Vestibular training  PLAN FOR NEXT SESSION: cont balance/vestibular exercises   Peggye Poon, Donavan Burnet, PT 04/27/2023, 10:23 AM

## 2023-04-29 ENCOUNTER — Other Ambulatory Visit: Payer: Self-pay | Admitting: Neurology

## 2023-05-01 ENCOUNTER — Ambulatory Visit: Payer: Self-pay | Admitting: Physical Therapy

## 2023-05-03 ENCOUNTER — Ambulatory Visit: Payer: Self-pay | Admitting: Physical Therapy

## 2023-05-03 NOTE — Telephone Encounter (Signed)
 Called pt. She accepted appt 05/15/23 at 11:30a w/ Dr. Vickey Huger. Scheduled pt.

## 2023-05-07 ENCOUNTER — Encounter: Payer: Self-pay | Admitting: Physical Therapy

## 2023-05-07 ENCOUNTER — Ambulatory Visit: Payer: Self-pay | Attending: Neurology | Admitting: Physical Therapy

## 2023-05-07 DIAGNOSIS — R42 Dizziness and giddiness: Secondary | ICD-10-CM | POA: Diagnosis present

## 2023-05-07 DIAGNOSIS — R2681 Unsteadiness on feet: Secondary | ICD-10-CM | POA: Insufficient documentation

## 2023-05-07 NOTE — Therapy (Signed)
 OUTPATIENT PHYSICAL THERAPY VESTIBULAR TREATMENT NOTE     Patient Name: [name] MRN: [id number] DOB:29-Aug-1970, 53 y.o., female Today's Date: 05/07/2023   PCP: No PCP per pt  REFERRING PROVIDER: Dohmeier, Porfirio Mylar, MD  END OF SESSION:  PT End of Session - 05/07/23 1923     Visit Number 8    Number of Visits 9    Date for PT Re-Evaluation 04/27/23   extended 1 week due to schedule availability   Authorization Type HTA    Authorization Time Period 03-20-23 - 05-18-23    PT Start Time 0759    PT Stop Time 0845    PT Time Calculation (min) 46 min    Equipment Utilized During Treatment Gait belt    Activity Tolerance Patient tolerated treatment well    Behavior During Therapy WFL for tasks assessed/performed                  Past Medical History:  Diagnosis Date   Achilles tendinitis    ADHD (attention deficit hyperactivity disorder), inattentive type    Diagnosed as an adult after starting college   Allergy    Arthritis    Arthritis    Ataxia    Back pain    Bipolar I disorder 04/18/2014   most recent episode mixed   Borderline personality disorder 10/27/2011   Cataracts, bilateral    Chest pain    Chronic pain disorder    due to several injuries affecting numerous areas of her body throughout the years   Chronic paroxysmal hemicrania, 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   rt chest wall nodule removal   GANGLION CYST EXCISION  2011   lipoma removal     right bunioectomy     SHOULDER SURGERY  01/13/2011   right, partial tear   tumor resection left thigh     Patient Active Problem List   Diagnosis Date Noted   Impaired functional mobility, balance, gait, and endurance 03/08/2023   Unexplained falls 03/08/2023   BPV (benign positional vertigo), right 03/08/2023   Other Specified Feeding or Eating Disorder, Emotional and Binge Eating Behaviors 01/15/2023   Pain in joint of right knee 01/15/2023   Other fatigue 09/24/2022   BMI 37.0-37.9, adult 03/14/2022   Shoulder subluxation, left, initial encounter 02/01/2022   Environmental allergies 01/31/2022   Class 3 severe obesity with  serious comorbidity and body mass index (BMI) of 40.0 to 44.9 in adult [facility]) 01/31/2022   Bipolar disorder (HCC) 11/28/2021   Arthritis of first metatarsophalangeal (MTP) joint of right foot 10/24/2021   Migraines 08/18/2021   Cervical radiculopathy 08/16/2021   Lipoma of left forearm 08/16/2021   Carpal tunnel syndrome on left 08/16/2021   Vitamin D deficiency 07/26/2021   Eating disorder 07/26/2021   Insulin resistance 07/06/2021   Other hyperlipidemia 07/06/2021   B12 deficiency 07/06/2021   Foot sprain, left, initial encounter 06/01/2021   Mood disorder (HCC) 04/29/2021   Sleep difficulties 04/29/2021   Other constipation 04/29/2021   Essential hypertension 04/29/2021   Sprain of ankle 03/29/2021   Subacromial bursitis of left shoulder joint 03/29/2021   Concussion with no loss of consciousness 02/07/2021   Lumbar  radiculopathy 02/07/2021   Shoulder impingement syndrome, left 11/01/2020   Sprain of metacarpophalangeal joint of left thumb 10/07/2020   Posterior tibial tendinitis of right lower extremity 09/13/2020   Labral tear of hip, degenerative 09/13/2020   Attention and concentration deficit 05/07/2020   Morbid obesity (HCC) 05/07/2020   Plantar wart 05/07/2020   Sleep apnea 05/07/2020   Urinary incontinence 05/07/2020   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   Genetic testing 02/27/2018   Family history of genetic disease carrier    Family history of breast cancer    Family history of colon cancer    History of multiple concussions 11/20/2017   Ataxia 11/20/2017   Cervical strain, acute, initial encounter 09/19/2016   Insomnia 02/17/2015   Right shoulder pain 12/31/2014   Episodic cluster headache, not intractable 12/02/2014   Chronic paroxysmal hemicrania, 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   Photophobia of both eyes 05/04/2014   Nausea with vomiting 05/04/2014   Bipolar I disorder, most recent episode mixed (HCC) 04/18/2014   Suicidal ideation 04/12/2014   Injury of right shoulder and upper arm 02/17/2014   Migraine with status migrainosus 01/08/2013   Chronic migraine 05/08/2012   Hypertension    Contact dermatitis 11/27/2011   Generalized anxiety disorder 10/27/2011   ADHD (attention deficit hyperactivity disorder), 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   Borderline personality disorder (HCC) 10/27/2011   Right foot pain 09/28/2011   Loss of transverse plantar arch 09/01/2011   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   Ganglion cyst 09/29/2009   Pes planus 07/01/2008     REFERRING DIAG: H81.11 (ICD-10-CM) - BPV (benign positional vertigo), right Z79.899 (ICD-10-CM) - Medication management G43.109 (ICD-10-CM) - Migraine with aura and without status migrainosus, not intractable R42 (ICD-10-CM) - Vertigo  THERAPY DIAG:   Unsteadiness on feet  Dizziness and giddiness  ONSET DATE: January 2025:  Referral date   Rationale for Evaluation and Treatment: Rehabilitation  SUBJECTIVE:   SUBJECTIVE STATEMENT: Pt returns to PT after having had to miss last week due to pulling a muscle in her back; pt reports her back is feeling much better.  Had another "drop sensation" last week when she was sitting in her recliner - doesn't know why they spontaneously happen.  Pt reports she joined [employer] last week.  Pt accompanied by: self  PERTINENT HISTORY: Bipolar Disorder, ADHD, Achilles Tendinitis, Chronic Pain, Migraines, HIstory of Multiple Concussions, HTN, Insomnia   PAIN:  Are you having pain?  Rt knee pain due to ACL sx in May 2024 - currently wearing brace - weight bearing aggravates it  PRECAUTIONS: None  RED FLAGS: None   WEIGHT BEARING RESTRICTIONS: No  FALLS: Has patient fallen in last 6 months? Yes. Number of falls 3  LIVING ENVIRONMENT: Lives with: lives with their family Lives in: House/apartment Stairs: Yes: External: 3 steps; on right going up  PLOF: Independent  PATIENT GOALS: "be steadier and not fall"  OBJECTIVE: Note: Objective measures were completed at Evaluation unless otherwise noted.  DIAGNOSTIC FINDINGS: IMPRESSION:  (CT Head 01/24) - no imaging of head since this date No acute intracranial process.  COGNITION: Overall cognitive status: Within functional limits for tasks assessed - pt scored well on cognitive assessment administered by neurology   SENSATION: WFL  POSTURE:  rounded shoulders, forward head, and increased thoracic kyphosis  Cervical ROM:  WFL's   STRENGTH: WFL's - pt is wearing knee brace on Rt knee due to ACL sx in May '24  LOWER EXTREMITY MMT:  NT due to vertigo eval   BED MOBILITY:  Independent  TRANSFERS: Assistive device utilized: None  Sit to stand: Modified independence Stand to sit: Modified independence   GAIT: Gait pattern:  [facility] Distance walked: 28' Assistive device utilized: None Level of assistance: Modified independence Comments: pt wearing brace on Rt knee due to s/p ACL sx in May 2024  VESTIBULAR ASSESSMENT:  GENERAL OBSERVATION: pt known to this PT and clinic with most recent previous admission Feb. - March 2023 for 10 visits for same diagnosis (dizziness and imbalance)   SYMPTOM BEHAVIOR:  Subjective history: pt reports she has had 2 episodes on vertigo at work in past 6 weeks in which she has lost her balance and fallen; exact etiology of vertigo is unknown - Dr. Vickey Huger gave her Epley to do which pt states it slightly helped for just a couple of days and then no improvement with dizziness returning  Non-Vestibular symptoms: headaches and migraine symptoms  Type of dizziness: Imbalance (Disequilibrium), Unsteady with head/body turns, and "Funny feeling in the head"  Frequency: varies - usually every other day - never goes away completely  Duration: varies - usually minutes for increased severity but dizziness is mostly constant  Aggravating factors: Induced by motion: activity in general, Worse with fatigue, Worse in the dark, Occurs when standing still , Moving eyes, and quick turns  Relieving factors: rest and slow movements  Progression of symptoms: worse  OCULOMOTOR EXAM:  Ocular Alignment: normal  Ocular ROM: No Limitations  Spontaneous Nystagmus: absent  Gaze-Induced Nystagmus: absent  Smooth Pursuits: intact; c/o dizziness (mild) with vertical pursuit testing  Saccades: intact  POSITIONAL TESTING: Right Sidelying: no nystagmus Left Sidelying: no nystagmus  MOTION SENSITIVITY:  Motion Sensitivity Quotient Intensity: 0 = none, 1 = Lightheaded, 2 = Mild, 3 = Moderate, 4 = Severe, 5 = Vomiting  Intensity  1. Sitting to supine 0  2. Supine to L side 0  3. Supine to R side 0  4. Supine to sitting 1  5. L Hallpike-Dix   6. Up from L    7. R Hallpike-Dix   8. Up from R    9. Sitting,  head tipped to L knee   10. Head up from L knee   11. Sitting, head tipped to R knee   12. Head up from R knee   13. Sitting head turns x5 2  14.Sitting head nods x5 2  15. In stance, 180 turn to L    16. In stance, 180 turn to R    FUNCTIONAL GAIT: MCTSIB: Condition 1: Avg of 3 trials: 30 sec, Condition 2: Avg of 3 trials: 30 sec, Condition  3: Avg of 3 trials: 30 sec, Condition 4: Avg of 3 trials: 30 sec, and Total Score: 120/120;  moderate postural sway on condition 4 but no LOB                                                                                                                             TREATMENT DATE:  05-07-23  NeuroRe-ed:  Sit to stand 5 reps without UE support from mat with EC for increased vestibular input in maintaining balance  Standing Balance: Surface: Airex Position: Feet Hip Width Apart Completed with: Eyes Open and Eyes Closed; Head Turns x 5 Reps and Head Nods x 5 Reps   Marching on Airex - EO - no head turns -10 reps each leg; added horizontal head turns 5 reps EO and then vertical head turns EO 5 reps with CGA for balance recovery;  marching EC 5 reps no head turns  Standing on Airex - target on 1 side/Hart chart on other side approx. 3' away - head turns horizontally reading lines; Edwyna Shell chart moved in front of patient standing on Airex - moved chart in various patterns with pt reading letters of called colors for improved gaze stabilization   Balance on rockerboard:  10 reps EO with minimal UE support;  EC with UE support prn - 10 reps; pt held board steady - performed horizontal head turns 5 reps with EO and then vertical head turns with EO  Pt amb. Approx. 25' making circles CW with ball, CCW with ball with eye and head movement for improved gaze stabilization during gait;  tossed ball and caught ball straight up 25' x 1 rep and then tossed/caught on Rt/Lt sides for improved balance with turning/trunk rotation  Pt performed x1 viewing exercise with  target on patterned background - 60 secs x 1 rep horizontally and 60 secs x 1 rep vertically 60 secs in standing - target approx. 4' away; pt then walked toward [place] chart on patterned background approx. 15' - reading called lines for improved accomodation and gaze stabilization   Access Code: [email or web address] URL: [email or web address] Date: 04/27/2023 Prepared by: Maebelle Munroe  Exercises - Standing on foam pad - Eyes open and then with Eyes closed   - 1 x daily - 7 x weekly - 3 sets - 10 reps - Standing Gaze Stabilization with Head Nod  - 1 x daily - 7 x weekly - 1 sets - 1 reps - 60 secs hold    PATIENT EDUCATION: Education details: added [place] eye exercises and provided copy of colored [employer] and also black/white [employer]  (05-07-23); pt instructed to progress gaze stabilization exercise to patterned background for HEP Person educated: Patient Education method: Explanation, Demonstration, and Handouts Education comprehension: verbalized understanding and returned demonstration  HOME EXERCISE PROGRAM:  balance on foam - EO and EC - feet apart  Access Code: [email or web address] URL: [email or web address] Date: 03/21/2023 Prepared by: Maebelle Munroe  Exercises - Standing on  foam pad - Eyes open and then with Eyes closed   - 1 x daily - 7 x weekly - 3 sets - 10 reps - gaze stabilization  GOALS: Goals reviewed with patient? Yes  SHORT TERM GOALS: same as LTG's as ELOS = 4 weeks   LONG TERM GOALS: Target date: 04-27-23; pushed out til 05-11-23 due to pt cancelling last week due to pulled back muscle  Pt will improve DHI score by at least 10 points to demo reduced dizziness for improved quality of life. Baseline:  Goal status: INITIAL  2.  Pt will demo increased vestibular input in maintaining balance by standing with EC on foam with minimal to no postural sway.  Baseline:  Goal status: INITIAL  3.  Pt will amb. 32' with horizontal head turns without c/o  dizziness and without LOB to increase safety with ambulation for environmental scanning.  Baseline:  Goal status: INITIAL  4.  Pt will perform 3 turns to each side (180 degrees) without c/o dizziness to increase safety with work activities and for habituation.  Baseline:  Goal status: INITIAL  5.  Independent in HEP for balance and vestibular exercises. Baseline:  Goal status: INITIAL   ASSESSMENT:  CLINICAL IMPRESSION: PT session focused on visual and vestibular exercises to improve balance and gaze stabilization with dynamic gait.  Pt reported mild dizziness provoked with exercises but few rest breaks requested during today's session.  Pt reported no HA at end of session.  Pt is progressing well - plan D/C next session.    OBJECTIVE IMPAIRMENTS: decreased balance, difficulty walking, and dizziness.   ACTIVITY LIMITATIONS: squatting, transfers, locomotion level, and quick turns with work activities  PARTICIPATION LIMITATIONS: community activity, occupation, and yard work  PERSONAL FACTORS: Behavior pattern, Past/current experiences, and Time since onset of injury/illness/exacerbation are also affecting patient's functional outcome.   REHAB POTENTIAL: Fair due to chronicity of problem and exact etiology of dizziness unknown  CLINICAL DECISION MAKING: Evolving/moderate complexity  EVALUATION COMPLEXITY: Moderate   PLAN:  PT FREQUENCY: 2x/week  PT DURATION: 4 weeks + eval  PLANNED INTERVENTIONS: 97110-Therapeutic exercises, 97530- Therapeutic activity, [id number]- Neuromuscular re-education, [phone]- Self Care, [id number]- Gait training, Patient/Family education, and Vestibular training  PLAN FOR NEXT SESSION: check LTG's - D/C;  cont balance/vestibular exercises   Kaylob Wallen, Donavan Burnet, PT 05/07/2023, 7:25 PM

## 2023-05-10 ENCOUNTER — Ambulatory Visit: Payer: Self-pay | Admitting: Physical Therapy

## 2023-05-10 DIAGNOSIS — R42 Dizziness and giddiness: Secondary | ICD-10-CM

## 2023-05-10 DIAGNOSIS — R2681 Unsteadiness on feet: Secondary | ICD-10-CM | POA: Diagnosis not present

## 2023-05-10 NOTE — Therapy (Unsigned)
 OUTPATIENT PHYSICAL THERAPY VESTIBULAR TREATMENT NOTE/DISCHARGE SUMMARY     Patient Name: [name] MRN: [id number] DOB:November 11, 1970, 53 y.o., female Today's Date: 05/11/2023   PCP: No PCP per pt  REFERRING PROVIDER: Dohmeier, Porfirio Mylar, MD  END OF SESSION:  PT End of Session - 05/11/23 1706     Visit Number 9    Number of Visits 9    Date for PT Re-Evaluation 04/27/23   extended 1 week due to schedule availability   Authorization Type HTA    Authorization Time Period 03-20-23 - 05-18-23    PT Start Time 1536    PT Stop Time 1620    PT Time Calculation (min) 44 min    Equipment Utilized During Treatment Gait belt    Activity Tolerance Patient tolerated treatment well    Behavior During Therapy WFL for tasks assessed/performed                   Past Medical History:  Diagnosis Date   Achilles tendinitis    ADHD (attention deficit hyperactivity disorder), inattentive type    Diagnosed as an adult after starting college   Allergy    Arthritis    Arthritis    Ataxia    Back pain    Bipolar I disorder 04/18/2014   most recent episode mixed   Borderline personality disorder 10/27/2011   Cataracts, bilateral    Chest pain    Chronic pain disorder    due to several injuries affecting numerous areas of her body throughout the years   Chronic paroxysmal hemicrania, 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   rt chest wall nodule removal   GANGLION CYST EXCISION  2011   lipoma removal     right bunioectomy     SHOULDER SURGERY  01/13/2011   right, partial tear   tumor resection left thigh     Patient Active Problem List   Diagnosis Date Noted   Impaired functional mobility, balance, gait, and endurance 03/08/2023   Unexplained falls 03/08/2023   BPV (benign positional vertigo), right 03/08/2023   Other Specified Feeding or Eating Disorder, Emotional and Binge Eating Behaviors 01/15/2023   Pain in joint of right knee 01/15/2023   Other fatigue 09/24/2022   BMI 37.0-37.9, adult 03/14/2022   Shoulder subluxation, left, initial encounter 02/01/2022   Environmental allergies 01/31/2022   Class 3 severe  obesity with serious comorbidity and body mass index (BMI) of 40.0 to 44.9 in adult [facility]) 01/31/2022   Bipolar disorder (HCC) 11/28/2021   Arthritis of first metatarsophalangeal (MTP) joint of right foot 10/24/2021   Migraines 08/18/2021   Cervical radiculopathy 08/16/2021   Lipoma of left forearm 08/16/2021   Carpal tunnel syndrome on left 08/16/2021   Vitamin D deficiency 07/26/2021   Eating disorder 07/26/2021   Insulin resistance 07/06/2021   Other hyperlipidemia 07/06/2021   B12 deficiency 07/06/2021   Foot sprain, left, initial encounter 06/01/2021   Mood disorder (HCC) 04/29/2021   Sleep difficulties 04/29/2021   Other constipation 04/29/2021   Essential hypertension 04/29/2021   Sprain of ankle 03/29/2021   Subacromial bursitis of left shoulder joint 03/29/2021   Concussion with no loss of  consciousness 02/07/2021   Lumbar radiculopathy 02/07/2021   Shoulder impingement syndrome, left 11/01/2020   Sprain of metacarpophalangeal joint of left thumb 10/07/2020   Posterior tibial tendinitis of right lower extremity 09/13/2020   Labral tear of hip, degenerative 09/13/2020   Attention and concentration deficit 05/07/2020   Morbid obesity (HCC) 05/07/2020   Plantar wart 05/07/2020   Sleep apnea 05/07/2020   Urinary incontinence 05/07/2020   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   Genetic testing 02/27/2018   Family history of genetic disease carrier    Family history of breast cancer    Family history of colon cancer    History of multiple concussions 11/20/2017   Ataxia 11/20/2017   Cervical strain, acute, initial encounter 09/19/2016   Insomnia 02/17/2015   Right shoulder pain 12/31/2014   Episodic cluster headache, not intractable 12/02/2014   Chronic paroxysmal hemicrania, 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   Photophobia of both eyes 05/04/2014   Nausea with vomiting 05/04/2014   Bipolar I disorder, most recent episode mixed (HCC) 04/18/2014   Suicidal ideation 04/12/2014   Injury of right shoulder and upper arm 02/17/2014   Migraine with status migrainosus 01/08/2013   Chronic migraine 05/08/2012   Hypertension    Contact dermatitis 11/27/2011   Generalized anxiety disorder 10/27/2011   ADHD (attention deficit hyperactivity disorder), 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   Borderline personality disorder (HCC) 10/27/2011   Right foot pain 09/28/2011   Loss of transverse plantar arch 09/01/2011   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 Morning; reports minimal dizziness - is ready for discharge today. Reports doing exercises at home as instructed  Pt accompanied by: self  PERTINENT HISTORY: Bipolar Disorder, ADHD, Achilles Tendinitis, Chronic Pain, Migraines, HIstory of Multiple Concussions, HTN, Insomnia   PAIN:  Are you having pain?  Rt knee pain due to ACL sx in May 2024 - currently wearing brace - weight bearing aggravates it  PRECAUTIONS: None  RED FLAGS: None   WEIGHT BEARING RESTRICTIONS: No  FALLS: Has patient fallen in last 6 months? Yes. Number of falls 3  LIVING ENVIRONMENT: Lives with: lives  with their family Lives in: House/apartment Stairs: Yes: External: 3 steps; on right going up  PLOF: Independent  PATIENT GOALS: "be steadier and not fall"  OBJECTIVE: Note: Objective measures were completed at Evaluation unless otherwise noted.  DIAGNOSTIC FINDINGS: IMPRESSION:  (CT Head 01/24) - no imaging of head since this date No acute intracranial process.  COGNITION: Overall cognitive status: Within functional limits for tasks assessed - pt scored well on cognitive assessment administered by neurology   SENSATION: WFL  POSTURE:  rounded shoulders, forward head, and increased thoracic kyphosis  Cervical ROM:  WFL's   STRENGTH: WFL's - pt is wearing knee brace on Rt knee due to ACL sx in May '24  LOWER EXTREMITY MMT:  NT due to vertigo eval   BED MOBILITY:  Independent  TRANSFERS: Assistive device utilized: None  Sit to stand: Modified independence Stand to sit: Modified independence   GAIT: Gait pattern: [facility] Distance walked: 67' Assistive device utilized:  None Level of assistance: Modified independence Comments: pt wearing brace on Rt knee due to s/p ACL sx in May 2024  VESTIBULAR ASSESSMENT:  GENERAL OBSERVATION: pt known to this PT and clinic with most recent previous admission Feb. - March 2023 for 10 visits for same diagnosis (dizziness and imbalance)   SYMPTOM BEHAVIOR:  Subjective history: pt reports she has had 2 episodes on vertigo at work in past 6 weeks in which she has lost her balance and fallen; exact etiology of vertigo is unknown - Dr. Vickey Huger gave her Epley to do which pt states it slightly helped for just a couple of days and then no improvement with dizziness returning  Non-Vestibular symptoms: headaches and migraine symptoms  Type of dizziness: Imbalance (Disequilibrium), Unsteady with head/body turns, and "Funny feeling in the head"  Frequency: varies - usually every other day - never goes away completely  Duration: varies - usually minutes for increased severity but dizziness is mostly constant  Aggravating factors: Induced by motion: activity in general, Worse with fatigue, Worse in the dark, Occurs when standing still , Moving eyes, and quick turns  Relieving factors: rest and slow movements  Progression of symptoms: worse  OCULOMOTOR EXAM:  Ocular Alignment: normal  Ocular ROM: No Limitations  Spontaneous Nystagmus: absent  Gaze-Induced Nystagmus: absent  Smooth Pursuits: intact; c/o dizziness (mild) with vertical pursuit testing  Saccades: intact  POSITIONAL TESTING: Right Sidelying: no nystagmus Left Sidelying: no nystagmus  MOTION SENSITIVITY:  Motion Sensitivity Quotient Intensity: 0 = none, 1 = Lightheaded, 2 = Mild, 3 = Moderate, 4 = Severe, 5 = Vomiting  Intensity  1. Sitting to supine 0  2. Supine to L side 0  3. Supine to R side 0  4. Supine to sitting 1  5. L Hallpike-Dix   6. Up from L    7. R Hallpike-Dix   8. Up from R    9. Sitting, head tipped to L knee   10. Head up from L knee   11.  Sitting, head tipped to R knee   12. Head up from R knee   13. Sitting head turns x5 2  14.Sitting head nods x5 2  15. In stance, 180 turn to L    16. In stance, 180 turn to R    FUNCTIONAL GAIT: MCTSIB: Condition 1: Avg of 3 trials: 30 sec, Condition 2: Avg of 3 trials: 30 sec, Condition 3: Avg of 3 trials: 30 sec, Condition 4: Avg of 3 trials: 30 sec, and Total Score: 120/120;  moderate postural sway on condition 4 but no LOB                                                                                                                             TREATMENT DATE:  05-10-23  NeuroRe-ed:  Pt stood on Airex - 30 secs with EO without LOB:  30 secs with EC without LOB (for LTG assessment)  Amb. 81' with horizontal head turns without LOB and without increase in dizziness - for LTG assessment; pt had mild deviation in path but no LOB  Pt performed sit to stand from mat - performed 180 degree turns - 3 reps toward Rt side and then 3 reps toward Lt side (for LTG assessment) with no c/o dizziness with this movement  Standing Balance: Surface: Airex Position: Feet Hip Width Apart Completed with: Eyes Open and Eyes Closed; Head Turns x 5 Reps and Head Nods x 5 Reps  Standing on Airex - target on 1 side/Hart chart on other side approx. 3' away - head turns horizontally reading lines  Balance on rockerboard:  10 reps EO with minimal UE support;  EC with UE support prn - 10 reps; pt held board steady - performed horizontal head turns 5 reps with EO and then vertical head turns with EO  Pt amb. Approx. 25' making circles CW with ball, CCW with ball with eye and head movement for improved gaze stabilization during gait  Pt performed habituation activity with visual input incorporated  -6 cones placed on black & white checkered cloth on floor - pt amb. To end and picked up 1 cone, turned 180 degrees and placed on table 12' away, read line on [place] chart for improved visual scanning and picked up  remaining cones individually with cues to attend to cones on cloth on floor to avoid knocking over to improve spatial awareness     Access Code: [email or web address] URL: [email or web address] Date: 04/27/2023 Prepared by: Maebelle Munroe  Exercises - Standing on foam pad - Eyes open and then with Eyes closed   - 1 x daily - 7 x weekly - 3 sets - 10 reps - Standing Gaze Stabilization with Head Nod  - 1 x daily - 7 x weekly - 1 sets - 1 reps - 60 secs hold    PATIENT EDUCATION: Education details: added [place] eye exercises and provided copy of colored [employer] and also black/white [employer]  (05-07-23); pt instructed to progress gaze stabilization exercise to patterned background for HEP Person educated: Patient Education method: Explanation, Demonstration, and Handouts Education comprehension: verbalized understanding and returned demonstration  HOME EXERCISE PROGRAM:  balance on foam - EO and EC - feet apart  Access Code: [email or web address] URL: [email or web address] Date: 03/21/2023 Prepared by: Maebelle Munroe  Exercises - Standing on foam pad - Eyes open and then with Eyes closed   - 1 x daily - 7 x weekly - 3 sets - 10 reps - gaze stabilization  GOALS: Goals reviewed with patient? Yes  SHORT TERM GOALS: same as LTG's as ELOS = 4 weeks   LONG TERM GOALS: Target date: 04-27-23; pushed out til 05-11-23 due to pt cancelling last week due to pulled back muscle  Pt will improve DHI score by at least 10 points to demo reduced dizziness for improved quality of life. Baseline:  Goal status: Deferred - was not completed  2.  Pt will demo increased vestibular input in maintaining balance by standing with EC on foam with minimal to no postural sway.  Baseline:  Goal status: Goal met 05-10-23  3.  Pt will amb. 49' with horizontal head turns without c/o dizziness and without LOB to increase safety with ambulation for environmental scanning.  Baseline:  Goal status: Goal met  05-10-23  4.  Pt will perform 3 turns to each side (180 degrees) without c/o dizziness to increase safety with work activities and for habituation.  Baseline:  Goal status: Goal met 05-10-23  5.  Independent in HEP for balance and vestibular exercises. Baseline:  Goal status: Goal met 05-10-23   ASSESSMENT:  CLINICAL IMPRESSION: Pt has met LTG's #2-5 with LTG #1 deferred as DHI was not completed. Pt reported minimal dizziness with vestibular/visual activities but reported dizziness quickly subsided with short rest period.  Pt tolerated exercises well and states dizziness is improved since starting PT for vestibular rehab.  Pt is discharged due to goals met and completion of program.    OBJECTIVE IMPAIRMENTS: decreased balance, difficulty walking, and dizziness.   ACTIVITY LIMITATIONS: squatting, transfers, locomotion level, and quick turns with work activities  PARTICIPATION LIMITATIONS: community activity, occupation, and yard work  PERSONAL FACTORS: Behavior pattern, Past/current experiences, and Time since onset of injury/illness/exacerbation are also affecting patient's functional outcome.   REHAB POTENTIAL: Fair due to chronicity of problem and exact etiology of dizziness unknown  CLINICAL DECISION MAKING: Evolving/moderate complexity  EVALUATION COMPLEXITY: Moderate   PLAN:  PT FREQUENCY: 2x/week  PT DURATION: 4 weeks + eval  PLANNED INTERVENTIONS: 97110-Therapeutic exercises, 97530- Therapeutic activity, 97112- Neuromuscular re-education, [phone]- Self Care, 60454- Gait training, Patient/Family education, and Vestibular training  PLAN FOR NEXT SESSION: D/C on 05-10-23   PHYSICAL THERAPY DISCHARGE SUMMARY  Visits from Start of Care: 9  Current functional level related to goals / functional outcomes: See above for progress towards goals   Remaining deficits: Continued decreased vestibular input in maintaining balance and decreased visual/vestibular function with  decreased spatial awareness Continued decreased high level balance skills   Education / Equipment: Pt has been instructed in HEP for balance/vestibular exercises and reports compliance with HEP   Patient agrees to discharge. Patient goals were met. Patient is being discharged due to meeting the stated rehab goals.   Pt is also discharged due to completion of program with end of certification period - no further needs identified at this time.   Kary Kos, PT 05/11/2023, 5:08 PM

## 2023-05-11 ENCOUNTER — Encounter: Payer: Self-pay | Admitting: Physical Therapy

## 2023-05-14 ENCOUNTER — Telehealth (INDEPENDENT_AMBULATORY_CARE_PROVIDER_SITE_OTHER): Admitting: Psychology

## 2023-05-14 DIAGNOSIS — F909 Attention-deficit hyperactivity disorder, unspecified type: Secondary | ICD-10-CM

## 2023-05-14 DIAGNOSIS — F319 Bipolar disorder, unspecified: Secondary | ICD-10-CM | POA: Diagnosis not present

## 2023-05-14 DIAGNOSIS — F5089 Other specified eating disorder: Secondary | ICD-10-CM

## 2023-05-14 NOTE — Progress Notes (Signed)
  Office: [phone]  /  Fax: [phone]    Date: May 14, 2023  Appointment Start Time: 11:31am Duration: 29 minutes Provider: Catherene Close, Psy.D. Type of Session: Individual Therapy  Location of Patient: Home (private location) Location of Provider: Provider's Home (private office) Type of Contact: Telepsychological Visit via MyChart Video Visit  Session Content: [name] is a 53 y.o. female presenting for a follow-up appointment to address the previously established treatment goal of increasing coping skills. Today's appointment was a telepsychological visit. [name] provided verbal consent for today's telepsychological appointment and she is aware she is responsible for securing confidentiality on her end of the session. Prior to proceeding with today's appointment, [name]'s physical location at the time of this appointment was obtained as well a phone number she could be reached at in the event of technical difficulties. [name] and this provider participated in today's telepsychological service.   This provider conducted a brief check-in. [name] reported attending physical therapy for her knee and vestibular concerns, adding she also signed up for a gym membership. Of note, she was unable to re-initiate services with her primary therapist due to finances. However, she discussed looking into providers with [facility]. Moreover, [name] described her eating habits as "terrible" due to grabbing quick/convenient foods between physical therapy appointments. Further explored and processed. To help her resume eating congruent to her goals with the clinic, she agreed to eat some protein with every meal/snack. Overall, [name] was receptive to today's appointment as evidenced by openness to sharing, responsiveness to feedback, and willingness to implement discussed strategies .  Mental Status Examination:  Appearance: neat Behavior: appropriate to circumstances Mood:  neutral Affect: mood congruent Speech: WNL Eye Contact: appropriate Psychomotor Activity: WNL Gait: unable to assess Thought Process: linear, logical, and goal directed and denies suicidal, homicidal, and self-harm ideation, plan and intent since the last appointment with this provider Thought Content/Perception: no hallucinations, delusions, bizarre thinking or behavior endorsed or observed Orientation: AAOx4 Memory/Concentration: intact Insight: fair Judgment: fair  Interventions:  Conducted a brief chart review Conducted a risk assessment Provided empathic reflections and validation Provided positive reinforcement Employed supportive psychotherapy interventions to facilitate reduced distress and to improve coping skills with identified stressors Engaged patient in problem solving Recommended/discussed options for longer-term therapeutic services  DSM-5 Diagnosis(es):  F50.89 Other Specified Feeding or Eating Disorder, Emotional and Binge Eating Behaviors, F90.9 Unspecified Attention-Deficit/Hyperactivity Disorder , and F31.9 Unspecified Bipolar and Related Disorder  Treatment Goal & Progress: [name] re-established care with this provider and the following treatment goal was established: increase coping skills. [name] has demonstrated progress in her goal as evidenced by her self-report of reducing soda intake and willingness to implement mindfulness-based strategies.    Plan: The next appointment is scheduled for 06/04/2023 at 11:30am, which will be via MyChart Video Visit. The next session will focus on working towards the established treatment goal. Additionally, she provided verbal consent for this provider to place a referral with [facility].    Catherene Close, PsyD

## 2023-05-15 ENCOUNTER — Ambulatory Visit: Admitting: Neurology

## 2023-05-15 ENCOUNTER — Encounter: Payer: Self-pay | Admitting: Neurology

## 2023-05-15 ENCOUNTER — Ambulatory Visit (INDEPENDENT_AMBULATORY_CARE_PROVIDER_SITE_OTHER): Admitting: Physician Assistant

## 2023-05-15 VITALS — BP 107/71 | HR 66 | Ht 69.0 in | Wt 261.0 lb

## 2023-05-15 DIAGNOSIS — R296 Repeated falls: Secondary | ICD-10-CM | POA: Diagnosis not present

## 2023-05-15 DIAGNOSIS — M5481 Occipital neuralgia: Secondary | ICD-10-CM

## 2023-05-15 DIAGNOSIS — Z7409 Other reduced mobility: Secondary | ICD-10-CM | POA: Diagnosis not present

## 2023-05-15 DIAGNOSIS — R42 Dizziness and giddiness: Secondary | ICD-10-CM

## 2023-05-15 DIAGNOSIS — M542 Cervicalgia: Secondary | ICD-10-CM | POA: Diagnosis not present

## 2023-05-15 MED ORDER — METHYLPREDNISOLONE ACETATE 80 MG/ML IJ SUSP: 80.0000 mg | Freq: Once | INTRAMUSCULAR | Status: AC

## 2023-05-15 MED ORDER — LIDOCAINE HCL 2 % IJ SOLN
2.5000 mL | Freq: Once | INTRAMUSCULAR | Status: AC
Start: 2023-05-15 — End: ?

## 2023-05-15 MED ORDER — BUPIVACAINE HCL 0.5 % IJ SOLN: 2.5000 mL | Freq: Once | INTRAMUSCULAR | Status: AC

## 2023-05-15 NOTE — Patient Instructions (Signed)
 Occipital Neuralgia  Occipital neuralgia is a type of headache that causes brief episodes of very bad pain in the back of the head. Pain from occipital neuralgia may spread (radiate) to other parts of the head. These headaches may be caused by irritation of the nerves that leave the spinal cord high up in the neck, just below the base of the skull (occipital nerves). The occipital nerves transmit sensations from the back of the head, the top of the head, and the areas behind the ears. What are the causes? This condition can occur without any known cause (primary headache syndrome). In other cases, this condition is caused by pressure on or irritation of one of the two occipital nerves. Pressure and irritation may be due to: Muscle spasm in the neck. Neck injury. Wear and tear of the vertebrae in the neck (osteoarthritis). Disease of the disks that separate the vertebrae. Swollen blood vessels that put pressure on the occipital nerves. Infections. Tumors. Diabetes. What are the signs or symptoms? This condition causes brief burning, stabbing, electric, shocking, or shooting pain in the back of the head that can radiate to the top of the head. It can happen on one side or both sides of the head. It can also cause: Pain behind the eye. Pain triggered by neck movement or hair brushing. Scalp tenderness. Aching in the back of the head between episodes of very bad pain. Pain that gets worse with exposure to bright lights. How is this diagnosed? Your health care provider may diagnose the condition based on a physical exam and your symptoms. Tests may be done, such as: Imaging studies of the brain and neck (cervical spine), such as an MRI or CT scan. These look for causes of pinched nerves. Applying pressure to the nerves in the neck to try to re-create the pain. Injection of numbing medicine into the occipital nerve areas to see if pain goes away (diagnostic nerve block). How is this  treated? Treatment for this condition may begin with simple measures, such as: Rest. Massage. Applying heat or cold to the area. Over-the-counter pain relievers. If these measures do not work, you may need other treatments, including: Medicines, such as: Prescription-strength anti-inflammatory medicines. Muscle relaxants. Anti-seizure medicines, which can relieve pain. Antidepressants, which can relieve pain. Injected medicines, such as medicines that numb the area (local anesthetic) and steroids. Pulsed radiofrequency ablation. This is when wires are implanted to deliver electrical impulses that block pain signals from the occipital nerve. Surgery to relieve nerve pressure. Physical therapy. Follow these instructions at home: Managing pain     Avoid any activities that cause pain. Rest when you have an attack of pain. Try gentle massage to relieve pain. Try a different pillow or sleeping position. If directed, apply heat to the affected area as often as told by your health care provider. Use the heat source that your health care provider recommends, such as a moist heat pack or a heating pad. Place a towel between your skin and the heat source. Leave the heat on for 20-30 minutes. Remove the heat if your skin turns bright red. This is especially important if you are unable to feel pain, heat, or cold. You have a greater risk of getting burned. If directed, put ice on the back of your head and neck area. To do this: Put ice in a plastic bag. Place a towel between your skin and the bag. Leave the ice on for 20 minutes, 2-3 times a day. Remove the ice  if your skin turns bright red. This is very important. If you cannot feel pain, heat, or cold, you have a greater risk of damage to the area. General instructions Take over-the-counter and prescription medicines only as told by your health care provider. Avoid things that make your symptoms worse, such as bright lights. Try to stay  active. Get regular exercise that does not cause pain. Ask your health care provider to suggest safe exercises for you. Work with a physical therapist to learn stretching exercises you can do at home. Practice good posture. Keep all follow-up visits. This is important. Contact a health care provider if: Your medicine is not working. You have new or worsening symptoms. Get help right away if: You have very bad head pain that does not go away. You have a sudden change in vision, balance, or speech. These symptoms may represent a serious problem that is an emergency. Do not wait to see if the symptoms will go away. Get medical help right away. Call your local emergency services (911 in the [place] Do not drive yourself to the hospital. Summary Occipital neuralgia is a type of headache that causes brief episodes of very bad pain in the back of the head. Pain from occipital neuralgia may spread (radiate) to other parts of the head. Treatment for this condition includes rest, massage, and medicines. This information is not intended to replace advice given to you by your health care provider. Make sure you discuss any questions you have with your health care provider. Document Revised: 11/15/2019 Document Reviewed: 11/16/2019 Elsevier Patient Education  2024 [employer].

## 2023-05-15 NOTE — Progress Notes (Signed)
 Provider:  Melvyn Novas, MD  Primary Care Physician:  none assigned            Chief Complaint according to patient   Patient presents with:                HISTORY OF PRESENT ILLNESS:  [name] is a 53 y.o. female patient who is here for revisit 05/15/2023 for  nerve block/ trigger point injection. .  Chief concern according to patient :  paraspinal neck pain and radiation into the left occipital area, less though into the right.    PT series was completed on Thursday.   A trigger point injection was performed at the site of maximal tenderness using 1% plain Lidocaine and  steroid. \See RN documentaion.  Nerve block  w/steroid: Pt signed consent   0.5% Bupivocaine 2.5 mL LOT: [id number]           EXP: 03/29/2025 NDC: 54098-119-14   2% Lidocaine 2.5 mL LOT: [id number] EXP: 08/29/2024 NDC: 30865-784-69   Methylprednisolone 80 mg/1 mL  see MAR   Witnessed by Lennie Muckle, RNThis was well tolerated, and followed by immediate  relief of pain.  No bleeding , no site reaction.  Total of 6 ml was used, divided in 2 syringes of 2.5 ml : in a mixture of 2 local anesthetics and one syringe  ( 5 ml) and only one syringe had an additional 1 ml added , methylprednisolone -steroid for the deeper injection site.    PROCEDURE NOTE FOR Nerve block/ Trigger point injection Cervicalgia/ Neuralgia occipitalis :  The condition has existed for more than 6 months, and pt does not have a diagnosis of ALS, Myasthenia Gravis or Lambert-Eaton Syndrome.  Risks and benefits of injections discussed and pt agrees to proceed with the procedure.  Written consent obtained  These injections are medically necessary. Pt  receives good benefits from these injections. These injections do not cause sedations or hallucinations which the oral therapies may cause.   Description of procedure:  The patient was placed in a sitting position. The standard protocol was used , injector  approaching from the back, preparing the area with alcohol rub, draping the patient in a sterile cloth. Marking injection sites with black ink on skin.   -Occipitalis muscle injection, 3 sites, bilaterally. The first injection was done one half way between the occipital protuberance and the tip of the mastoid process behind the ear. The second injection site was done lateral and superior to the first, 1 fingerbreadth from the first injection. The third injection site was 1 fingerbreadth superiorly and medially from the first injection site.  -Cervical paraspinal muscle injection, 2 sites, bilaterally.   -Trapezius muscle injection was performed at 3 sites, bilaterally. The first injection site was in the upper trapezius muscle halfway between the inflection point of the neck, and the acromion. The second injection site was one half way between the acromion and the first injection site. The third injection was done between the first injection site and the inflection point of the neck.    [facility] [address] [place], [place] [address]  Phone [phone] Fax [phone] Note: This document was prepared with digital dictation and possible smart phrase technology. Any transcriptional errors that result from this process are unintentional.          Social History   Socioeconomic History   Marital status: Single    Spouse  name: Not on file   Number of children: 0   Years of education: 16   Highest education level: Bachelor's degree (e.g., BA, AB, BS)  Occupational History   Occupation: Training and development officer: [employer]  Tobacco Use   Smoking status: Never   Smokeless tobacco: Never  Vaping Use   Vaping status: Never Used  Substance and Sexual Activity   Alcohol use: No    Alcohol/week: 0.0 standard drinks of alcohol   Drug use: No   Sexual activity: Never    Birth control/protection: None  Other Topics Concern   Not on file  Social  History Narrative   Caffeine  2 sodas daily, 1 cup coffee daily.   Pt lives with mom   Pt works    Social Drivers of Community education officer: Not on file  Food Insecurity: Not on file  Transportation Needs: Not on file  Physical Activity: Not on file  Stress: Not on file  Social Connections: Not on file    Family History  Problem Relation Age of Onset   Cancer Mother    Kidney disease Mother    Hypertension Mother    Hyperlipidemia Mother    Breast cancer Mother 22       genetic testing- CHEK2 likely pathogenic variant   Endometrial cancer Mother    Obesity Father    Anxiety disorder Father    Depression Father    Heart attack Father    Heart disease Father    Hypertension Father    Bipolar disorder Father    Heart disease Maternal Aunt    Breast cancer Maternal Aunt 21   Breast cancer Maternal Aunt 63   Breast cancer Maternal Aunt 35   Breast cancer Maternal Aunt    Colon cancer Maternal Uncle 58   Alzheimer's disease Maternal Uncle    Dementia Paternal Aunt    Heart disease Maternal Grandmother    Colon cancer Maternal Grandmother 66   Lymphoma Maternal Grandfather 55   Diabetes Paternal Grandfather    Bone cancer Cousin 45   Cervical cancer Cousin        Genetic testing- 'was positive for a gene' had a mastectomy   Kidney disease Other    Depression Other    Anxiety disorder Other    Bipolar disorder Other    Obesity Other    Migraines Neg Hx     Past Medical History:  Diagnosis Date   Achilles tendinitis    ADHD (attention deficit hyperactivity disorder), inattentive type    Diagnosed as an adult after starting college   Allergy    Arthritis    Arthritis    Ataxia    Back pain    Bipolar I disorder 04/18/2014   most recent episode mixed   Borderline personality disorder 10/27/2011   Cataracts, bilateral    Chest pain    Chronic pain disorder    due to several injuries affecting numerous areas of her body throughout the years    Chronic paroxysmal hemicrania, 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   rt chest wall nodule removal   GANGLION CYST EXCISION  01/30/2009   lipoma removal     right bunioectomy     SHOULDER SURGERY  01/13/2011   right, partial tear   tumor resection left thigh       Current Outpatient Medications on File Prior to Visit  Medication Sig Dispense Refill   ALFALFA PO Take 500 mg by mouth daily.     amphetamine-dextroamphetamine (ADDERALL XR) 20 MG 24 hr capsule Take 20 mg by mouth daily.     Ascorbic Acid (VITAMIN C) 1000 MG tablet Take 1,000 mg by mouth daily.     calcium carbonate (OSCAL) 1500 (600 Ca) MG TABS tablet Take 1,500 mg by mouth in the morning and at bedtime.     cetirizine (ZYRTEC) 10 MG tablet Take 10 mg by mouth daily.     Cholecalciferol (D3 5000) 125 MCG (5000 UT) capsule Take 1 capsule (5,000 Units total) by mouth daily.      divalproex (DEPAKOTE ER) 500 MG 24 hr tablet TAKE 2 TABLETS BY MOUTH ONCE DAILY AT NIGHT 180 tablet 0   EMGALITY 120 MG/ML SOSY INJECT 120MG  INTO THE SKIN EVERY 30 DAYS 1 mL 1   hydrOXYzine (VISTARIL) 50 MG capsule Take 50 mg by mouth in the morning and at bedtime.   2   lamoTRIgine (LAMICTAL) 150 MG tablet Take 300 mg by mouth at bedtime.  1   LATUDA 20 MG TABS tablet Take 20 mg by mouth every morning.     lurasidone (LATUDA) 80 MG TABS tablet Take 1 tablet (80 mg total) by mouth daily with supper. (Along with 20mg  in AM for mood) 30 tablet 0   metoprolol succinate (TOPROL-XL) 100 MG 24 hr tablet Take 1 tablet (100 mg total) by mouth daily. Take with or immediately following a meal. 30 tablet 0   Multiple Vitamin (MULTIVITAMIN WITH MINERALS) TABS tablet Take 1 tablet by mouth daily.     mupirocin ointment (BACTROBAN) 2 % Apply 1 Application topically as needed (irritation).     Omega-3 Fatty Acids (OMEGA 3 PO) Take 1 capsule by mouth daily.     promethazine (PHENERGAN) 25 MG tablet Take 1 tablet (25 mg total) by mouth 2 (two) times daily as needed for nausea or vomiting. 30 tablet 1   Rimegepant Sulfate (NURTEC) 75 MG TBDP Take 1 tablet at the onset of migraine. Only 1 tablet in 24 hours. 9 tablet 11   traZODone (DESYREL) 100 MG tablet Take 1 tablet (100 mg total) by mouth at bedtime. 30 tablet 0   verapamil (CALAN-SR) 120 MG CR tablet TAKE 1 TABLET(120 MG) BY MOUTH AT BEDTIME 90 tablet 1  Zinc 50 MG TABS Take 50 mg by mouth daily.     docusate sodium (COLACE) 100 MG capsule Take 1 capsule (100 mg total) by mouth 2 (two) times daily. (Patient not taking: Reported on 04/16/2023) 60 capsule 0   No current facility-administered medications on file prior to visit.    Allergies  Allergen Reactions   Other Itching    Steri-strip also hand rash and redness.   Adhesive [Tape] Itching and Rash    Also reacted to Steri Strips and Band-Aids.   Amoxicillin Hives   Hydromorphone Hcl Other (See  Comments) and Itching   Medroxyprogesterone Acetate Other (See Comments)   Morphine Nausea And Vomiting   Oxycodone-Acetaminophen Other (See Comments) and Itching   Penicillins Hives    Has patient had a PCN reaction causing immediate rash, facial/tongue/throat swelling, SOB or lightheadedness with hypotension: YES  Has patient had a PCN reaction causing severe rash involving mucus membranes or skin necrosis: NO  Has patient had a PCN reaction that required hospitalization NO  Has patient had a PCN reaction occurring within the last 10 years:NO  If all of the above answers are "NO", then may proceed with Cephalosporin use.   Percocet [Oxycodone-Acetaminophen] Itching   Prednisone Other (See Comments)    Pt reports Prednisone induces Manic episodes   Provera [Medroxyprogesterone Acetate] Other (See Comments)    Causes manic episodes   Tramadol Hcl Other (See Comments) and Itching     DIAGNOSTIC DATA (LABS, IMAGING, TESTING) - I reviewed patient records, labs, notes, testing and imaging myself where available.  Lab Results  Component Value Date   WBC 6.0 03/06/2023   HGB 14.8 03/06/2023   HCT 44.6 03/06/2023   MCV 92 03/06/2023   PLT 251 03/06/2023      Component Value Date/Time   NA 141 03/06/2023 1234   K 4.2 03/06/2023 1234   CL 103 03/06/2023 1234   CO2 23 03/06/2023 1234   GLUCOSE 83 03/06/2023 1234   GLUCOSE 101 (H) 08/17/2021 1526   BUN 10 03/06/2023 1234   CREATININE 0.94 03/06/2023 1234   CREATININE 0.83 09/11/2011 1524   CALCIUM 9.6 03/06/2023 1234   PROT 5.9 (L) 03/06/2023 1234   ALBUMIN 4.0 03/06/2023 1234   AST 17 03/06/2023 1234   ALT 12 03/06/2023 1234   ALKPHOS 80 03/06/2023 1234   BILITOT 0.4 03/06/2023 1234   GFRNONAA >60 08/18/2021 1755   GFRAA 69 01/15/2020 0948   Lab Results  Component Value Date   CHOL 221 (H) 03/06/2023   HDL 60 03/06/2023   LDLCALC 143 (H) 03/06/2023   TRIG 99 03/06/2023   CHOLHDL 3.4 07/06/2021   Lab Results   Component Value Date   HGBA1C 5.3 03/06/2023   Lab Results  Component Value Date   VITAMINB12 1,109 03/06/2023   Lab Results  Component Value Date   TSH 2.530 03/06/2023    PHYSICAL EXAM:  Today's Vitals   05/15/23 1153  BP: 107/71  Pulse: 66  Weight: 261 lb (118.4 kg)  Height: 5\' 9"  (1.753 m)   Body mass index is 38.54 kg/m.   Wt Readings from Last 3 Encounters:  05/15/23 261 lb (118.4 kg)  04/16/23 251 lb (113.9 kg)  03/08/23 255 lb (115.7 kg)     Ht Readings from Last 3 Encounters:  05/15/23 5\' 9"  (1.753 m)  04/16/23 5\' 9"  (1.753 m)  03/08/23 5\' 9"  (1.753 m)      General: The patient is awake, alert and appears not  in acute distress. The patient is well groomed. Head: Normocephalic, atraumatic.   Neck is  tender, paraspinal on the left more than right.   Cardiovascular:  Regular rate and cardiac rhythm by pulse,  without distended neck veins. Respiratory: Lungs are clear to auscultation.  Skin:  Without evidence of ankle edema, or rash. Trunk: The patient's posture is erect.   NEUROLOGIC EXAM: The patient is awake and alert, oriented to place and time.   Memory subjective described as intact.  Attention span & concentration ability appears normal.  Speech is fluent,  without  dysarthria, dysphonia or aphasia.  Mood and affect are appropriate.   Cranial nerves: no loss of smell or taste reported  Pupils are equal and briskly reactive to light. Funduscopic exam without papilledema.  Extraocular movements in vertical and horizontal planes were intact and without nystagmus . No Diplopia. Visual fields by finger perimetry are intact. Hearing was intact to soft voice and finger rubbing.    Facial sensation intact to fine touch.  Facial motor strength is symmetric and tongue and uvula move midline.  Neck ROM : rotation, tilt and flexion extension were normal for age and shoulder shrug was symmetrical.     ASSESSMENT AND PLAN     Triggerpoint  injection/ Nerve block  - cervicalgia and occipital neuralgia.    After spending a total time of  21  minutes face to face and additional time for physical and neurologic examination, review of laboratory studies,  personal review of imaging studies, reports and results of other testing and review of referral information / records as far as provided in visit,   Electronically signed by: Neomia Banner, MD 05/15/2023 12:21 PM  [facility] Board certified by The [employer] of Sleep Medicine and Diplomate of the [employer] of [employer] Medicine. Board certified In Neurology through the ABPN, Fellow of the [employer] of Neu[employer].   A trigger point injection was performed at the site of maximal tenderness using 1% plain Lidocaine and  steroids,  This was well tolerated, and followed by  relief of pain.

## 2023-05-15 NOTE — Progress Notes (Signed)
 Nerve block  w/steroid: Pt signed consent  0.5% Bupivocaine 2.5 mL LOT: [id number]  EXP: 03/29/2025 NDC: 54098-119-14  2% Lidocaine 2.5 mL LOT: [id number] EXP: 08/29/2024 NDC: 30865-784-69  Methylprednisolone 80 mg/1 mL  see MAR  Witnessed by Mora Apple, RN

## 2023-05-29 ENCOUNTER — Ambulatory Visit (INDEPENDENT_AMBULATORY_CARE_PROVIDER_SITE_OTHER): Admitting: Physician Assistant

## 2023-05-29 ENCOUNTER — Encounter (INDEPENDENT_AMBULATORY_CARE_PROVIDER_SITE_OTHER): Payer: Self-pay | Admitting: Physician Assistant

## 2023-05-29 VITALS — BP 119/79 | HR 61 | Temp 98.2°F | Ht 69.0 in | Wt 252.0 lb

## 2023-05-29 DIAGNOSIS — E88819 Insulin resistance, unspecified: Secondary | ICD-10-CM | POA: Diagnosis not present

## 2023-05-29 DIAGNOSIS — F5089 Other specified eating disorder: Secondary | ICD-10-CM | POA: Diagnosis not present

## 2023-05-29 DIAGNOSIS — Z6837 Body mass index (BMI) 37.0-37.9, adult: Secondary | ICD-10-CM

## 2023-05-29 DIAGNOSIS — M25561 Pain in right knee: Secondary | ICD-10-CM

## 2023-05-29 NOTE — Progress Notes (Signed)
 SUBJECTIVE: Discussed the use of AI scribe software for clinical note transcription with the patient, who gave verbal consent to proceed.  Chief Complaint: Obesity  Interim History: She is up 1 lb since her last visit.  Down 18 lbs overall TBW loss of 6.7% [name] is here to discuss her progress with her obesity treatment plan. She is on the Category 2 Plan and states she is following her eating plan approximately 60 % of the time. She states she is not exercising. [name] is a 53 year old female who presents for follow-up of her obesity treatment plan.  She has a history of insulin  resistance, vitamin D  deficiency, hyperlipidemia, and a knee injury. She faces ongoing challenges with weight management. Her diet includes salads with protein and occasionally peanut butter with crackers. She is working on reducing processed foods and increasing fresh food intake.  She sustained a knee injury many months ago, resulting in ongoing difficulty and impaired functional mobility. She experiences neck pain and decreased balance, for which she was seen by neurology- Dr. Albertina [name] and underwent trigger point injections and started physical therapy. She reports her neck is much improved. She has completed physical therapy for balance issues, attributed to vestibular and visual causes, and was given home exercises to continue. With regard to her knee, she has been cleared to return to the gym, where she plans to work with a trainer to address her physical limitations. She has jointed the [employer] .   She discusses significant stress in her life, including work-related stress and family dynamics, which she feels may be impacting her health. She has not yet scheduled an appointment due to her mother's illness and her own work schedule.  Her A1c level is stable at 5.3. Her insulin  level was noted to be slightly elevated at 7.3, which is worse than previous levels but not severely elevated.  Fasting labs next  OV  OBJECTIVE: Visit Diagnoses: Problem List Items Addressed This Visit     Morbid obesity (HCC)   Insulin  resistance - Primary   BMI 37.0-37.9, adult   Other Specified Feeding or Eating Disorder, Emotional and Binge Eating Behaviors   Pain in joint of right knee  Obesity Obesity with challenges in weight management, influenced by stress and work schedule.  Stress management is crucial due to its impact on cortisol levels, metabolism, and insulin  resistance. - Encourage regular exercise, including gym sessions at [facility] with [employer] program. - Advise reducing processed foods and increasing protein intake with meals. - Recommend bi-weekly therapy sessions to manage stress.  Insulin  resistance Insulin  resistance with recent insulin  level of 7.3 and A1c at 5.3, improved.  Stress and lifestyle factors contribute to insulin  resistance. - Encourage regular exercise to improve insulin  sensitivity. - Advise maintaining a balanced diet with reduced processed foods and increased protein intake.   Eating disorder/emotional eating/stress [name] has had issues with stress/emotional eating. Currently this is fair to moderately controlled. Overall mood is stable. Medication(s): Other: Multiple psychotropic medications per behavioral health  Plan: Continue Other: medications as prescribed Working with Dr. Delaine [name] on emotional eating.  Also plans to pursue regular visits with a  therapist to address multiple ongoing issues/stressors.    Knee injury Knee injury with impaired functional mobility. Regular exercise is recommended to improve mobility and strength. - Encourage regular exercise, including gym sessions, to improve mobility and strength. - She plans to see a trainer at the [facility] with [employer] program.   Neck pain and decreased  balance Neck pain and decreased balance with vestibular and visual issues. Completed physical therapy with home exercises provided. Trigger point  injections have been effective in symptom management. - Continue home exercises as instructed by physical therapy. - Encourage regular exercise, including gym sessions, to improve balance and strength.  Vitals Temp: 98.2 F (36.8 C) BP: 119/79 Pulse Rate: 61 SpO2: 99 %   Anthropometric Measurements Height: 5\' 9"  (1.753 m) Weight: 252 lb (114.3 kg) BMI (Calculated): 37.2 Weight at Last Visit: 251 lb Weight Lost Since Last Visit: 0 Weight Gained Since Last Visit: 1 lb Starting Weight: 270 lb Total Weight Loss (lbs): 18 lb (8.165 kg) Peak Weight: 308 lb   Body Composition  Body Fat %: 50 % Fat Mass (lbs): 126.2 lbs Muscle Mass (lbs): 119.6 lbs Total Body Water (lbs): 91.4 lbs Visceral Fat Rating : 14   Other Clinical Data Fasting: yes Labs: no Today's Visit #: 32 Starting Date: 02/15/21 Comments: did not wear brace     ASSESSMENT AND PLAN:  Diet: [name] is currently in the action stage of change. As such, her goal is to continue with weight loss efforts. She has agreed to Category 2 Plan.  Exercise: [name] has been instructed to work up to a goal of 150 minutes of combined cardio and strengthening exercise per week for weight loss and overall health benefits.   Behavior Modification:  We discussed the following Behavioral Modification Strategies today: increasing lean protein intake, decreasing simple carbohydrates, increasing vegetables, increase H2O intake, increase high fiber foods, meal planning and cooking strategies, emotional eating strategies , avoiding temptations, and planning for success. We discussed various medication options to help [name] with her weight loss efforts and we both agreed to continue to work on nutritional and behavioral strategies to promote weight loss.  .  Return in about 6 weeks (around 07/10/2023).[name] She was informed of the importance of frequent follow up visits to maximize her success with intensive lifestyle modifications for her  multiple health conditions.  Attestation Statements:   Reviewed by clinician on day of visit: allergies, medications, problem list, medical history, surgical history, family history, social history, and previous encounter notes.   Time spent on visit including pre-visit chart review and post-visit care and charting was 39 minutes.    Joyce Dacruz, PA-C

## 2023-06-04 ENCOUNTER — Telehealth (INDEPENDENT_AMBULATORY_CARE_PROVIDER_SITE_OTHER): Admitting: Psychology

## 2023-06-04 DIAGNOSIS — F319 Bipolar disorder, unspecified: Secondary | ICD-10-CM | POA: Diagnosis not present

## 2023-06-04 DIAGNOSIS — F909 Attention-deficit hyperactivity disorder, unspecified type: Secondary | ICD-10-CM

## 2023-06-04 DIAGNOSIS — F5089 Other specified eating disorder: Secondary | ICD-10-CM

## 2023-06-04 NOTE — Progress Notes (Signed)
  Office: [phone]  /  Fax: [phone]    Date: Jun 04, 2023  Appointment Start Time: 11:32am Duration: 26 minutes Provider: Catherene Close, Psy.D. Type of Session: Individual Therapy  Location of Patient: Home (private location) Location of Provider: Provider's Home (private office) Type of Contact: Telepsychological Visit via MyChart Video Visit  Session Content: [name] is a 53 y.o. female presenting for a follow-up appointment to address the previously established treatment goal of increasing coping skills.Today's appointment was a telepsychological visit. [name] provided verbal consent for today's telepsychological appointment and she is aware she is responsible for securing confidentiality on her end of the session. Prior to proceeding with today's appointment, [name]'s physical location at the time of this appointment was obtained as well a phone number she could be reached at in the event of technical difficulties. [name] and this provider participated in today's telepsychological service.   This provider conducted a brief check-in. [name] reported experiencing nausea for the past four days. It was recommended she contact her PCP. She agreed. Regarding eating habits, she described an increase in protein intake. She acknowledged some instances of engagement in emotional eating behaviors. Further explored and processed. Psychoeducation provided regarding urge surfing to help cope with emotional/binge eating behaviors. [name] provided verbal consent during today's appointment for this provider to send a handout about urge surfing via e-mail. Overall, [name] was receptive to today's appointment as evidenced by openness to sharing, responsiveness to feedback, and willingness to implement discussed strategies .  Mental Status Examination:  Appearance: neat Behavior: appropriate to circumstances Mood: neutral Affect: mood congruent Speech: WNL Eye Contact: appropriate Psychomotor  Activity: WNL Gait: unable to assess Thought Process: linear, logical, and goal directed and denies suicidal, homicidal, and self-harm ideation, plan and intent since the last appointment with this provider Thought Content/Perception: no hallucinations, delusions, bizarre thinking or behavior endorsed or observed Orientation: AAOx4 Memory/Concentration: intact Insight: fair Judgment: fair  Interventions:  Conducted a brief chart review Conducted a risk assessment Provided empathic reflections and validation Provided positive reinforcement Employed supportive psychotherapy interventions to facilitate reduced distress and to improve coping skills with identified stressors Psychoeducation provided regarding urge surfing  DSM-5 Diagnosis(es):  F50.89 Other Specified Feeding or Eating Disorder, Emotional and Binge Eating Behaviors, F90.9 Unspecified Attention-Deficit/Hyperactivity Disorder , and F31.9 Unspecified Bipolar and Related Disorder  Treatment Goal & Progress: [name] re-established care with this provider and the following treatment goal was established: increase coping skills. [name] has demonstrated progress in her goal as evidenced by her self-report of reducing soda intake, willingness to implement mindfulness-based strategies, and willingness to implement other learned strategies (e.g., urge surfing).   Plan: The next appointment is scheduled for 07/02/2023 at 2pm, which will be via MyChart Video Visit. The next session will focus on working towards the established treatment goal. [name] will contact her PCP regarding nausea as well as [facility] to establish care.    Catherene Close, PsyD

## 2023-06-19 ENCOUNTER — Other Ambulatory Visit: Payer: Self-pay | Admitting: Neurology

## 2023-06-20 NOTE — Telephone Encounter (Signed)
 Last seen on 03/08/23 Follow up scheduled on 09/10/23

## 2023-06-29 ENCOUNTER — Other Ambulatory Visit: Payer: Self-pay | Admitting: Adult Health

## 2023-07-02 ENCOUNTER — Telehealth (INDEPENDENT_AMBULATORY_CARE_PROVIDER_SITE_OTHER): Admitting: Psychology

## 2023-07-02 DIAGNOSIS — F5089 Other specified eating disorder: Secondary | ICD-10-CM | POA: Diagnosis not present

## 2023-07-02 DIAGNOSIS — F319 Bipolar disorder, unspecified: Secondary | ICD-10-CM | POA: Diagnosis not present

## 2023-07-02 DIAGNOSIS — F909 Attention-deficit hyperactivity disorder, unspecified type: Secondary | ICD-10-CM | POA: Diagnosis not present

## 2023-07-02 NOTE — Progress Notes (Signed)
  Office: [phone]  /  Fax: [phone]    Date: July 02, 2023  Appointment Start Time: 2:02pm Duration: 32 minutes Provider: Catherene Close, Psy.D. Type of Session: Individual Therapy  Location of Patient: Home (private location) Location of Provider: Provider's Home (private office) Type of Contact: Telepsychological Visit via MyChart Video Visit  Session Content: [name] is a 53 y.o. female presenting for a follow-up appointment to address the previously established treatment goal of increasing coping skills. Today's appointment was a telepsychological visit. [name] provided verbal consent for today's telepsychological appointment and she is aware she is responsible for securing confidentiality on her end of the session. Prior to proceeding with today's appointment, [name]'s physical location at the time of this appointment was obtained as well a phone number she could be reached at in the event of technical difficulties. Daneshia and this provider participated in today's telepsychological service.   This provider conducted a brief check-in. [name] reported she is in the process of completing new patient paperwork with [facility] to establish care. She also shared about ongoing stress secondary to her mother's health, work, and knee pain which has reportedly led to "depressed" mood. Regarding eating habits, she stated limited cooking at home has impacted her protein intake. Reviewed recent eating habits and triggers for emotional eating behaviors. Explored what has helped her in the past to cope with depressed mood. She identified a plan to continue engaging in the following: gardening, playing games on phone, and exploring videos on YouTube. Psychoeducation provided regarding guided imagery to help cope. She was led through an exercise (private garden) and her experience was processed. Overall, [name] was receptive to today's appointment as evidenced by openness to sharing,  responsiveness to feedback, and verbalization of benefit from today's guided imagery exercise.   Mental Status Examination:  Appearance: neat Behavior: appropriate to circumstances Mood: "depressed" Affect: mood congruent Speech: WNL Eye Contact: appropriate Psychomotor Activity: WNL Gait: unable to assess Thought Process: linear, logical, and goal directed and denies suicidal, homicidal, and self-harm ideation, plan and intent since the last appointment with this provider  Thought Content/Perception: no hallucinations, delusions, bizarre thinking or behavior endorsed or observed Orientation: AAOx4 Memory/Concentration: intact Insight: fair Judgment: fair  Interventions:  Conducted a brief chart review Conducted a risk assessment Provided empathic reflections and validation Provided positive reinforcement Employed supportive psychotherapy interventions to facilitate reduced distress and to improve coping skills with identified stressors Employed acceptance and commitment interventions to emphasize mindfulness, acceptance without struggle, as well as value guided actions Psychoeducation provided regarding guided imagery Engaged patient in a guided imagery exercise  DSM-5 Diagnosis(es): F50.89 Other Specified Feeding or Eating Disorder, Emotional and Binge Eating Behaviors, F90.9 Unspecified Attention-Deficit/Hyperactivity Disorder , and F31.9 Unspecified Bipolar and Related Disorder  Treatment Goal & Progress: [name] re-established care with this provider and the following treatment goal was established: increase coping skills. [name] has demonstrated progress in her goal as evidenced by her self-report of reducing soda intake, willingness to implement mindfulness-based strategies, and willingness to implement other learned strategies (e.g., urge surfing, guided imagery).   Plan: The next appointment is scheduled for 07/24/2023 at 10am, which will be via MyChart Video Visit. The next  session will focus on working towards the established treatment goal. [name] will establish care with [facility].   Catherene Close, PsyD

## 2023-07-09 DIAGNOSIS — M1711 Unilateral primary osteoarthritis, right knee: Secondary | ICD-10-CM | POA: Diagnosis not present

## 2023-07-09 DIAGNOSIS — M7051 Other bursitis of knee, right knee: Secondary | ICD-10-CM | POA: Diagnosis not present

## 2023-07-09 DIAGNOSIS — L308 Other specified dermatitis: Secondary | ICD-10-CM | POA: Diagnosis not present

## 2023-07-09 DIAGNOSIS — L818 Other specified disorders of pigmentation: Secondary | ICD-10-CM | POA: Diagnosis not present

## 2023-07-10 ENCOUNTER — Ambulatory Visit (INDEPENDENT_AMBULATORY_CARE_PROVIDER_SITE_OTHER): Admitting: Physician Assistant

## 2023-07-15 ENCOUNTER — Other Ambulatory Visit: Payer: Self-pay

## 2023-07-15 ENCOUNTER — Emergency Department (HOSPITAL_COMMUNITY)

## 2023-07-15 ENCOUNTER — Encounter (HOSPITAL_COMMUNITY): Payer: Self-pay

## 2023-07-15 ENCOUNTER — Emergency Department (HOSPITAL_COMMUNITY)
Admission: EM | Admit: 2023-07-15 | Discharge: 2023-07-15 | Disposition: A | Discharge status: Home / Self Care | Attending: Emergency Medicine | Admitting: Emergency Medicine

## 2023-07-15 ENCOUNTER — Encounter: Payer: Self-pay | Admitting: Neurology

## 2023-07-15 DIAGNOSIS — W268XXA Contact with other sharp object(s), not elsewhere classified, initial encounter: Secondary | ICD-10-CM | POA: Insufficient documentation

## 2023-07-15 DIAGNOSIS — S0990XA Unspecified injury of head, initial encounter: Secondary | ICD-10-CM | POA: Insufficient documentation

## 2023-07-15 DIAGNOSIS — I10 Essential (primary) hypertension: Secondary | ICD-10-CM | POA: Diagnosis not present

## 2023-07-15 DIAGNOSIS — R519 Headache, unspecified: Secondary | ICD-10-CM

## 2023-07-15 DIAGNOSIS — Z79899 Other long term (current) drug therapy: Secondary | ICD-10-CM | POA: Insufficient documentation

## 2023-07-15 DIAGNOSIS — R42 Dizziness and giddiness: Secondary | ICD-10-CM

## 2023-07-15 DIAGNOSIS — Z7409 Other reduced mobility: Secondary | ICD-10-CM

## 2023-07-15 DIAGNOSIS — H8111 Benign paroxysmal vertigo, right ear: Secondary | ICD-10-CM

## 2023-07-15 MED ORDER — LACTATED RINGERS IV BOLUS
1000.0000 mL | Freq: Once | INTRAVENOUS | Status: AC
  Administered 2023-07-15: 1000 mL via INTRAVENOUS

## 2023-07-15 MED ORDER — KETOROLAC TROMETHAMINE 15 MG/ML IJ SOLN
15.0000 mg | Freq: Once | INTRAMUSCULAR | Status: AC
  Administered 2023-07-15: 15 mg via INTRAVENOUS
  Filled 2023-07-15: qty 1

## 2023-07-15 NOTE — ED Provider Notes (Signed)
 [place] EMERGENCY DEPARTMENT AT [facility] Provider Note   CSN: [id number] Arrival date & time: 07/15/23  1604     Patient presents with: Head Injury   Irania Durell [name] is a 53 y.o. female with h/o bipolar, HTN, migraines, anxiety, and four previous concussions presents to the ER today for evaluation after head injury. Around 1800 yesterday, the patient reports that she was bending down when she hit the left side of her head on a purse hook. Denies any LOC. No blood thinner use.  She denies any LOC, syncope, nausea, vomiting.  She is able to work the rest of her shift and took some Tylenol  when she got home.  She reports she woke up this morning with still having some pressure to the left side of her head and around her eyes but no visual changes.  No blurry vision, diplopia, eye pain.  She is concerned that she may have given as of another concussion due to her past medical history of them.  She took Tylenol  1000 mg around 1420 this morning.  She does have a history of migraines however this does not feel as bad as her migraines.  She denies any trouble walking or trouble talking.  Has not follow-up with her neurologist for this.  Patient did drive herself here.  Head Injury Associated symptoms: headache   Associated symptoms: no neck pain        Prior to Admission medications   Medication Sig Start Date End Date Taking? Authorizing Provider  amphetamine -dextroamphetamine  (ADDERALL XR) 20 MG 24 hr capsule Take 20 mg by mouth daily.    [provider]  Ascorbic Acid (VITAMIN C) 1000 MG tablet Take 1,000 mg by mouth daily.    [provider]  calcium  carbonate (OSCAL) 1500 (600 Ca) MG TABS tablet Take 1,500 mg by mouth in the morning and at bedtime.    [provider]  cetirizine  (ZYRTEC ) 10 MG tablet Take 10 mg by mouth daily. 06/20/21   [provider]  Cholecalciferol (D3 5000) 125 MCG (5000 UT) capsule Take 1 capsule (5,000 Units total) by  mouth daily. 03/05/21   Opalski, Bernardo Bridgeman, DO  divalproex  (DEPAKOTE  ER) 500 MG 24 hr tablet TAKE 2 TABLETS BY MOUTH ONCE DAILY AT NIGHT 07/02/23   Dohmeier, Raoul Byes, MD  EMGALITY  120 MG/ML SOSY INJECT 120MG  INTO THE SKIN EVERY 30 DAYS 06/20/23   Glory Larsen, MD  hydrOXYzine  (VISTARIL ) 50 MG capsule Take 50 mg by mouth in the morning and at bedtime.  05/18/17   [provider]  lamoTRIgine  (LAMICTAL ) 150 MG tablet Take 300 mg by mouth at bedtime. 07/27/17   [provider]  LATUDA  20 MG TABS tablet Take 20 mg by mouth every morning. 04/28/19   [provider]  lurasidone  (LATUDA ) 80 MG TABS tablet Take 1 tablet (80 mg total) by mouth daily with supper. (Along with 20mg  in AM for mood) 04/25/21   Opalski, Bernardo Bridgeman, DO  metoprolol  succinate (TOPROL -XL) 100 MG 24 hr tablet Take 1 tablet (100 mg total) by mouth daily. Take with or immediately following a meal. 01/06/17   Money, Christella Coventry, FNP  Multiple Vitamin (MULTIVITAMIN WITH MINERALS) TABS tablet Take 1 tablet by mouth daily.    [provider]  mupirocin ointment (BACTROBAN) 2 % Apply 1 Application topically as needed (irritation). 01/28/20   [provider]  Omega-3 Fatty Acids (OMEGA 3 PO) Take 1 capsule by mouth daily.    [provider]  promethazine  (PHENERGAN ) 25 MG tablet Take 1 tablet (25 mg total) by mouth 2 (two) times daily as needed for nausea or vomiting. 02/02/21   Dohmeier, Raoul Byes, MD  Rimegepant Sulfate (NURTEC) 75 MG TBDP Take 1 tablet at the onset of migraine. Only 1 tablet in 24 hours. 12/26/22   Millikan, [name], NP  traZODone  (DESYREL ) 100 MG tablet Take 1 tablet (100 mg total) by mouth at bedtime. 04/25/21   Opalski, Bernardo Bridgeman, DO  verapamil  (CALAN -SR) 120 MG CR tablet TAKE 1 TABLET(120 MG) BY MOUTH AT BEDTIME 05/17/22   Millikan, [name], NP  Zinc 50 MG TABS Take 50 mg by mouth daily.    [provider]    Allergies: Other, Adhesive [tape], Amoxicillin, Hydromorphone hcl,  Medroxyprogesterone acetate, Morphine , Oxycodone -acetaminophen , Penicillins, Percocet [oxycodone -acetaminophen ], Prednisone , Provera [medroxyprogesterone acetate], and Tramadol  hcl    Review of Systems  Constitutional:  Negative for chills and fever.  Eyes:  Negative for photophobia, pain and visual disturbance.  Musculoskeletal:  Negative for gait problem, neck pain and neck stiffness.  Neurological:  Positive for headaches. Negative for syncope, speech difficulty and light-headedness.    Updated Vital Signs BP 126/77 (BP Location: Right Arm)   Pulse 65   Temp 98.2 F (36.8 C) (Oral)   Resp 17   Ht 5' 9.5 (1.765 m)   Wt 115.7 kg   SpO2 98%   BMI 37.12 kg/m   Physical Exam Vitals and nursing note reviewed.  Constitutional:      General: She is not in acute distress.    Appearance: She is not toxic-appearing.     Comments: In room, light on, watching TV in no acute distress.   HENT:     Head: Normocephalic and atraumatic.      Comments: Nontender to the temples to light touch.  She does have some tenderness in the marked area but no step-off or deformity.  No overlying skin change.  No battle signs or raccoon eyes.    Mouth/Throat:     Mouth: Mucous membranes are moist.   Eyes:     General: No scleral icterus.    Extraocular Movements: Extraocular movements intact.     Pupils: Pupils are equal, round, and reactive to light.   Pulmonary:     Effort: Pulmonary effort is normal. No respiratory distress.   Musculoskeletal:     Cervical back: Normal range of motion.   Skin:    General: Skin is warm and dry.   Neurological:     General: No focal deficit present.     Mental Status: She is alert.     GCS: GCS eye subscore is 4. GCS verbal subscore is 5. GCS motor subscore is 6.     Cranial Nerves: No cranial nerve deficit, dysarthria or facial asymmetry.     Sensory: No sensory deficit.     Motor: No weakness or pronator drift.     Coordination: Finger-Nose-Finger Test  normal.     (all labs ordered are listed, but only abnormal results are displayed) Labs Reviewed - No data to display  EKG: None  Radiology: CT Head Wo Contrast Result Date: 07/15/2023 CLINICAL DATA:  Head trauma, moderate-severe. Hit left temple on metal door knob yesterday. Dizziness and nausea. EXAM: CT HEAD WITHOUT CONTRAST TECHNIQUE: Contiguous axial images were obtained from the base of the skull through the vertex without intravenous contrast. RADIATION DOSE REDUCTION: This exam was performed according to the departmental dose-optimization program which includes automated exposure control, adjustment of the mA and/or  kV according to patient size and/or use of iterative reconstruction technique. COMPARISON:  Head CT 02/09/2022 and MRI 08/18/2021 FINDINGS: Brain: There is no evidence of an acute infarct, intracranial hemorrhage, mass, midline shift, or extra-axial fluid collection. Cerebral volume is normal. The ventricles are normal in size. Vascular: No hyperdense vessel. Skull: No acute fracture or suspicious lesion. Sinuses/Orbits: Visualized paranasal sinuses and mastoid air cells are clear. Bilateral cataract extraction. Other: None. IMPRESSION: Negative head CT. Electronically Signed   By: Aundra Lee M.D.   On: 07/15/2023 18:02   Procedures   Medications Ordered in the ED  ketorolac  (TORADOL ) 15 MG/ML injection 15 mg (15 mg Intravenous Given 07/15/23 1826)  lactated ringers bolus 1,000 mL (0 mLs Intravenous Stopped 07/15/23 2001)    Medical Decision Making Amount and/or Complexity of Data Reviewed Radiology: ordered.  Risk Prescription drug management.   53 y.o. female presents to the ER for evaluation of headache after head injury. Differential diagnosis includes but is not limited to Stroke, increased ICP, meningitis, CVA, intracranial tumor, venous sinus thrombosis, migraine, cluster headache, hypertension, drug related, head injury, tension headache, sinusitis, dental  abscess, otitis media, TMJ, temporal arteritis, glaucoma, trigeminal neuralgia. Vital signs BP 146/100, otherwise unremarkable. Physical exam as noted above.   Patient is likely experiencing a posttussive migraine/headache.  She is already on multiple migraine medications.  She has a benign neurological examination.  Her physical exam does not show any concerning signs or trauma, no battle signs or raccoon eyes, no step-offs or deformities.  She is answering questions appropriately appropriate speech.  She did drive herself here so I am limited to what I can give her for migraine cocktail in addition to her allergies.  We settled on giving Toradol  and some fluids.  Will CT the head to rule out any trauma  CT Head unremarkable. Per radiologist's interpretation.    Patient is not appear in any acute distress and is watching television on reevaluation.  Reports that her headache has improved.  She is not having any visual changes, I doubt any venous sinus thrombosis given that this hurt after the head injury and also has a history of migraines. Not consistent with GCA. She has no focal neurodeficit and her vital signs are stable.  This examination is not consistent with any stroke.  She has no fever or nuchal rigidity, doubt any meningitis.  Recommended following up with her neurologist as she has seen the before for her postconcussive symptoms.  She is stable for discharge home with close outpatient follow-up.  We discussed the results of the labs/imaging. The plan is supportive care, follow up with neurology. We discussed strict return precautions and red flag symptoms. The patient verbalized their understanding and agrees to the plan. The patient is stable and being discharged home in good condition.  Portions of this report may have been transcribed using voice recognition software. Every effort was made to ensure accuracy; however, inadvertent computerized transcription errors may be present.    Final  diagnoses:  Generalized headache  Injury of head, initial encounter    ED Discharge Orders     None          Spence Dux, Kirby Peoples 07/16/23 0127    [name] Repine, MD 07/17/23 1110

## 2023-07-15 NOTE — ED Triage Notes (Signed)
 Patient said yesterday she bent down and hit her left temple on on the metal door knob. Felt a sharp pain. Feels nauseous and dizzy. No LOC. No blood thinners.

## 2023-07-15 NOTE — Discharge Instructions (Addendum)
 You were seen in the emerged from today for evaluation of your headache.  I am glad that you are feeling better.  Your CT scan was unremarkable.  Please continue to take your medications as prescribed.  He can also take 1000 mg of Tylenol  and/or 600 mg of ibuprofen  every 6 hours as needed for pain.  You can also follow-up with your neurologist.  I have listed more information into the discharge paperwork for you to review.  If you have any concerns, new or worsening symptoms, please return to your nearest emergency department for reevaluation.  Contact a health care provider if: Medicine does not help your symptoms. You have a headache that is different from your usual headache. You have nausea or you vomit. You have a fever. Get help right away if: Your headache: Becomes severe quickly. Gets worse after moderate to intense physical activity. You have any of these symptoms: Repeated vomiting. Pain or stiffness in your neck. Changes to your vision. Pain in an eye or ear. Problems with speech. Muscular weakness or loss of muscle control. Loss of balance or coordination. You feel faint or pass out. You have confusion. You have a seizure. These symptoms may represent a serious problem that is an emergency. Do not wait to see if the symptoms will go away. Get medical help right away. Call your local emergency services (911 in the [place] Do not drive yourself to the hospital.

## 2023-07-16 ENCOUNTER — Telehealth: Payer: Self-pay | Admitting: Neurology

## 2023-07-16 NOTE — Telephone Encounter (Signed)
 Pt called because pt  can not see My chart message from Nurse . Informed Pt that Nurse did respond and she request to speak to .

## 2023-07-16 NOTE — Telephone Encounter (Signed)
 See my chart message

## 2023-07-16 NOTE — Telephone Encounter (Signed)
 Called the patient and advised that Dr Albertina Hugger states that she should try alternating ice and tylenol  as needed for pain. Since the pt complains of the dizziness and room spinning sensation, she is not opposed to vestibular rehab being ordered to help with these symptoms. Order placed for the pt. She was appreciative for the call back.

## 2023-07-16 NOTE — Addendum Note (Signed)
 Addended by: Jeanie Miller C on: 07/16/2023 03:00 PM   Modules accepted: Orders

## 2023-07-16 NOTE — Telephone Encounter (Signed)
 Pt called in because her internet is down and she was unable to receive messages. She states on sat she hit her head on the left side of her head. She states since then it has just been an intense pain. States that she can't turn her head or bend over without feeling pain or dizziness/room spinning.  Reviewed the ER notes and advised that the patient that looked like she denied N&V as well as denied any LOC from hitting her head. Informed her that CT was negative of anything concerning. In the past when she hit her head she completed PT which helped. Informed her that this is likely just a head injury and is just going be tender. Informed I would run it by Dr Albertina Hugger and make sure she doesn't have anything else to add. I can see about ordering vestibular rehab again.

## 2023-07-24 ENCOUNTER — Telehealth (INDEPENDENT_AMBULATORY_CARE_PROVIDER_SITE_OTHER): Admitting: Psychology

## 2023-07-24 ENCOUNTER — Encounter (INDEPENDENT_AMBULATORY_CARE_PROVIDER_SITE_OTHER): Payer: Self-pay | Admitting: Physician Assistant

## 2023-07-24 DIAGNOSIS — F5089 Other specified eating disorder: Secondary | ICD-10-CM | POA: Diagnosis not present

## 2023-07-24 DIAGNOSIS — F319 Bipolar disorder, unspecified: Secondary | ICD-10-CM | POA: Diagnosis not present

## 2023-07-24 DIAGNOSIS — F909 Attention-deficit hyperactivity disorder, unspecified type: Secondary | ICD-10-CM

## 2023-07-24 NOTE — Progress Notes (Signed)
 Office: [phone]  /  Fax: [phone]    Date: July 24, 2023  Appointment Start Time: 10:01am Duration: 46 minutes Provider: Wyatt [name], Psy.D. Type of Session: Individual Therapy  Location of Patient: Home (private location) Location of Provider: Provider's Home (private office) Type of Contact: Telepsychological Visit via MyChart Video Visit  Session Content: [name] is a 53 y.o. female presenting for a follow-up appointment to address the previously established treatment goal of increasing coping skills. Today's appointment was a telepsychological visit. [name] provided verbal consent for today's telepsychological appointment and she is aware she is responsible for securing confidentiality on her end of the session. Prior to proceeding with today's appointment, [name]'s physical location at the time of this appointment was obtained as well a phone number she could be reached at in the event of technical difficulties. [name] and this provider participated in today's telepsychological service.   This provider conducted a brief check-in. [name] shared about her garden and described excitement regarding the plants that are growing. She stated she did not schedule an appointment with  Behavioral Medicine due to costs noted on the paperwork. Further clarification was provided and she agreed to f/u with her insurance company today to double check costs for therapeutic services. Additionally, [name] shared her mother needs back surgery, adding she is scheduled on July 17th to determine if she will be cleared for surgery. Explored and processed associated thoughts and feelings. Moreover, [name] shared about her recent head injury that occurred at work, noting she is still experiencing residual symptoms. She was encouraged to contact her PCP and/or neurologist should sxs persist/worsen. [name] agreed. Remainder for today's appointment focused on eating habits and how they are impacted by  chronic stress. [name] stated she continues to graze, adding she is trying to focus on consuming protein but continues to experience challenges, which she attributed to ongoing stress and feeling disappointed with herself due to current finances. Explored how she previously coped with chronic stress and engaged her in problem solving. [name] agreed to focus on/implement the following: garden, play games on her phone when possible, and watch [employer] of [employer]. Reviewed 10 calories to 1 gram protein ratio when choosing foods to eat, especially when exploring convenient options. Overall, [name] was receptive to today's appointment as evidenced by openness to sharing, responsiveness to feedback, and willingness to continue engaging in learned skills.  Mental Status Examination:  Appearance: neat Behavior: appropriate to circumstances Mood: neutral Affect: mood congruent Speech: WNL Eye Contact: appropriate Psychomotor Activity: WNL Gait: unable to assess Thought Process: linear, logical, and goal directed and denies suicidal, homicidal, and self-harm ideation, plan and intent since the last appointment with this provider Thought Content/Perception: no hallucinations, delusions, bizarre thinking or behavior endorsed or observed Orientation: AAOx4 Memory/Concentration: intact Insight: fair Judgment: fair  Interventions:  Conducted a brief chart review Conducted a risk assessment Provided empathic reflections and validation Provided positive reinforcement Employed supportive psychotherapy interventions to facilitate reduced distress and to improve coping skills with identified stressors Engaged patient in problem solving Recommended/discussed options for longer-term therapeutic services  DSM-5 Diagnosis(es): F50.89 Other Specified Feeding or Eating Disorder, Emotional and Binge Eating Behaviors, F90.9 Unspecified Attention-Deficit/Hyperactivity Disorder , and F31.9  Unspecified Bipolar and Related Disorder  Treatment Goal & Progress: [name] re-established care with this provider and the following treatment goal was established: increase coping skills. [name] has demonstrated progress in her goal as evidenced by her self-report of reducing soda intake, willingness to implement mindfulness-based strategies, and willingness  to implement other learned strategies (e.g., urge surfing, guided imagery).   Plan: The next appointment is scheduled for 08/14/2023 at 2pm, which will be via MyChart Video Visit. The next session will focus on working towards the established treatment goal. [name] will contact her insurance to ascertain mental health benefits prior to establishing care with [facility].     Joyce Fire, PsyD

## 2023-07-31 DIAGNOSIS — M19012 Primary osteoarthritis, left shoulder: Secondary | ICD-10-CM | POA: Diagnosis not present

## 2023-07-31 DIAGNOSIS — M898X1 Other specified disorders of bone, shoulder: Secondary | ICD-10-CM | POA: Diagnosis not present

## 2023-08-02 ENCOUNTER — Encounter: Payer: Self-pay | Admitting: Physical Therapy

## 2023-08-02 ENCOUNTER — Ambulatory Visit: Attending: Neurology | Admitting: Physical Therapy

## 2023-08-02 DIAGNOSIS — H8111 Benign paroxysmal vertigo, right ear: Secondary | ICD-10-CM | POA: Diagnosis not present

## 2023-08-02 DIAGNOSIS — R2689 Other abnormalities of gait and mobility: Secondary | ICD-10-CM | POA: Diagnosis not present

## 2023-08-02 DIAGNOSIS — R2681 Unsteadiness on feet: Secondary | ICD-10-CM | POA: Diagnosis not present

## 2023-08-02 DIAGNOSIS — Z7409 Other reduced mobility: Secondary | ICD-10-CM | POA: Insufficient documentation

## 2023-08-02 DIAGNOSIS — R42 Dizziness and giddiness: Secondary | ICD-10-CM | POA: Diagnosis not present

## 2023-08-02 DIAGNOSIS — Z79899 Other long term (current) drug therapy: Secondary | ICD-10-CM | POA: Insufficient documentation

## 2023-08-02 NOTE — Therapy (Signed)
 OUTPATIENT PHYSICAL THERAPY VESTIBULAR EVALUATION     Patient Name: [name] MRN: [id number] DOB:[birth year], 53 y.o., female Today's Date: 08/03/2023  END OF SESSION:  PT End of Session - 08/03/23 1608     Visit Number 1    Number of Visits 9    Date for PT Re-Evaluation 09/07/23   extended 1 week due to schedule availability   Authorization Type HTA    Authorization Time Period 08-02-23 - 09-30-23    Progress Note Due on Visit 10    PT Start Time 1151    PT Stop Time 1235    PT Time Calculation (min) 44 min    Equipment Utilized During Treatment --    Activity Tolerance Patient tolerated treatment well    Behavior During Therapy WFL for tasks assessed/performed          Past Medical History:  Diagnosis Date   Achilles tendinitis    ADHD (attention deficit hyperactivity disorder), inattentive type    Diagnosed as an adult after starting college   Allergy     Arthritis    Arthritis    Ataxia    Back pain    Bipolar I disorder 04/18/2014   most recent episode mixed   Borderline personality disorder 10/27/2011   Cataracts, bilateral    Chest pain    Chronic pain disorder    due to several injuries affecting numerous areas of her body throughout the years   Chronic paroxysmal hemicrania, 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   rt chest wall nodule removal   GANGLION CYST EXCISION  01/30/2009   lipoma removal     right bunioectomy     SHOULDER SURGERY  01/13/2011   right, partial tear   tumor resection left thigh     Patient Active Problem List   Diagnosis Date Noted   Impaired functional mobility, balance, gait, and endurance 03/08/2023   Unexplained falls 03/08/2023   BPV (benign positional vertigo), right 03/08/2023   Other Specified Feeding or Eating Disorder, Emotional and Binge Eating Behaviors 01/15/2023   Pain in joint of right knee 01/15/2023   Other fatigue 09/24/2022   BMI 37.0-37.9, adult 03/14/2022   Shoulder subluxation, left, initial encounter 02/01/2022   Environmental allergies 01/31/2022   Class 3 severe obesity  with serious comorbidity and body mass index (BMI) of 40.0 to 44.9 in adult 01/31/2022   Bipolar disorder (HCC) 11/28/2021   Arthritis of first metatarsophalangeal (MTP) joint of right foot 10/24/2021   Migraines 08/18/2021   Cervical radiculopathy 08/16/2021   Lipoma of left forearm 08/16/2021   Carpal tunnel syndrome on left 08/16/2021   Vitamin D  deficiency 07/26/2021   Eating disorder 07/26/2021   Insulin  resistance 07/06/2021   Other hyperlipidemia 07/06/2021   B12 deficiency 07/06/2021   Foot sprain, left, initial encounter 06/01/2021   Mood disorder (HCC) 04/29/2021   Sleep difficulties 04/29/2021   Other constipation 04/29/2021   Essential hypertension 04/29/2021   Sprain of ankle 03/29/2021   Subacromial bursitis of left shoulder joint 03/29/2021    Concussion with no loss of consciousness 02/07/2021   Lumbar radiculopathy 02/07/2021   Shoulder impingement syndrome, left 11/01/2020   Sprain of metacarpophalangeal joint of left thumb 10/07/2020   Posterior tibial tendinitis of right lower extremity 09/13/2020   Labral tear of hip, degenerative 09/13/2020   Attention and concentration deficit 05/07/2020   Morbid obesity (HCC) 05/07/2020   Plantar wart 05/07/2020   Sleep apnea 05/07/2020   Urinary incontinence 05/07/2020   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   Genetic testing 02/27/2018   Family history of genetic disease carrier    Family history of breast cancer    Family history of colon cancer    History of multiple concussions 11/20/2017   Ataxia 11/20/2017   Cervical strain, acute, initial encounter 09/19/2016   Insomnia 02/17/2015   Right shoulder pain 12/31/2014   Episodic cluster headache, not intractable 12/02/2014   Chronic paroxysmal hemicrania, 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   Photophobia of both eyes 05/04/2014   Nausea with vomiting 05/04/2014   Bipolar I disorder, most recent episode mixed (HCC) 04/18/2014   Suicidal ideation 04/12/2014   Injury of right shoulder and upper arm 02/17/2014   Migraine with status migrainosus 01/08/2013   Chronic migraine 05/08/2012   Hypertension    Contact dermatitis 11/27/2011   Generalized anxiety disorder 10/27/2011   ADHD (attention deficit hyperactivity disorder), inattentive type 10/27/2011   Borderline personality disorder (HCC) 10/27/2011   Right foot pain 09/28/2011   Loss of transverse plantar arch 09/01/2011   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   Ganglion cyst 09/29/2009   Pes planus 07/01/2008    PCP: None REFERRING PROVIDER: Dohmeier, Dedra, MD  REFERRING DIAG: Z74.09 (ICD-10-CM) - Impaired functional mobility, balance, gait, and endurance R42 (ICD-10-CM) - Vertigo Z79.899 (ICD-10-CM) -  Medication management H81.11 (ICD-10-CM) - BPV (benign positional vertigo), right  THERAPY DIAG:  Dizziness and giddiness  Unsteadiness on feet  Other abnormalities of gait and mobility  ONSET DATE: 07-14-23  Rationale for Evaluation and Treatment: Rehabilitation  SUBJECTIVE:   SUBJECTIVE STATEMENT: Pt hit her head on 07-14-23 on a hook in the bathroom at work; pt reports her memory is worse since this accident - is misplacing things a lot more:  pt reports she is having more headaches lately - doesn't know if it's all the rain lately, stress, etc.; pt reports she is more dizzy since the accident  Pt accompanied by: self  PERTINENT HISTORY: Bipolar Disorder, ADHD, Achilles Tendinitis, Chronic Pain, Migraines, HIstory of Multiple Concussions, HTN, Insomnia, s/p ACL sx in May 2024 - continues to wear knee brace on RLE  PAIN:  Are you having pain? Yes: NPRS scale: 4/10   Pain location: Lt temple  region Pain description: more dull pain now  Aggravating factors: bending over or reaching too far forward on Lt side Relieving factors: Tylenol   PRECAUTIONS: Fall  RED FLAGS: None   WEIGHT BEARING RESTRICTIONS: No  FALLS: Has patient fallen in last 6 months? No  LIVING ENVIRONMENT: Lives with: lives with their family Lives in: House/apartment Stairs: Yes: External: 3 steps; on right going up Has following equipment at home: None  PLOF: Independent  PATIENT GOALS: improve balance, reduce dizziness  OBJECTIVE:  Note: Objective measures were completed at Evaluation unless otherwise noted.  DIAGNOSTIC FINDINGS: N/A  COGNITION: Overall cognitive status: pt reports more problems with memory and cognition since injury on 07-15-23    POSTURE:  rounded shoulders and forward head  Cervical ROM:  WNL's   STRENGTH: WNL's  LOWER EXTREMITY MMT: WNL's   GAIT: Gait pattern: WFL Distance walked: 35' Assistive device utilized: None Level of assistance: Modified  independence Comments:   FUNCTIONAL TESTS:  MCTSIB: Condition 1: Avg of 3 trials: 30 sec, Condition 2: Avg of 3 trials: 30 sec, Condition 3: Avg of 3 trials: 30 sec, Condition 4: Avg of 3 trials: 5.9 sec, and Total Score: 95.9/120    VESTIBULAR ASSESSMENT:  GENERAL OBSERVATION: pt known to this clinic for vestibular rehab due to h/o previous concussions; pt reports she hit her head on wall hook in bathroom at work on 07-14-23 when she returned to upright standing after bending down to pick up tissue off the floor; pt reports she has moderate tenderness in Lt side of head - Lt temple region; pt reports she continued with her work and didn't think much of it; on Sunday returned to work but was having a hard time - left early due to dizziness - went to ED at [facility]    SYMPTOM BEHAVIOR:  Subjective history: see above - pt reports increased dizziness and memory problems since hitting head on 07-14-23  Non-Vestibular symptoms: headaches and migraine symptoms  Type of dizziness: Imbalance (Disequilibrium), Unsteady with head/body turns, and dull pain in Lt side of head  Frequency: daily  Duration: mostly constant - worse with movement  Aggravating factors: Induced by motion: bending down to the ground and activity in general  Relieving factors: lying supine, rest, and slow movements  Progression of symptoms: better - pain has improved since initial accident  OCULOMOTOR EXAM:  Ocular Alignment: normal  Ocular ROM: No Limitations  Spontaneous Nystagmus: absent  Gaze-Induced Nystagmus: absent  Smooth Pursuits: intact  Saccades: intact  VESTIBULAR - OCULAR REFLEX:   Slow VOR: Normal    Dynamic Visual Acuity: Static: line 9 Dynamic: line 7 - c/o moderate dizziness upon completion of test   POSITIONAL TESTING: Other: NT based on diagnosis of post concussion   MOTION SENSITIVITY:  Motion Sensitivity Quotient Intensity: 0 = none, 1 = Lightheaded, 2 = Mild, 3 = Moderate, 4 = Severe, 5 =  Vomiting  Intensity  1. Sitting to supine   2. Supine to L side   3. Supine to R side   4. Supine to sitting   5. L Hallpike-Dix   6. Up from L    7. R Hallpike-Dix   8. Up from R    9. Sitting, head tipped to L knee   10. Head up from L knee   11. Sitting, head tipped to R knee   12. Head up from R knee   13. Sitting head turns x5   14.Sitting head nods x5   15.  In stance, 180 turn to L    16. In stance, 180 turn to R                                                                                                                                 TREATMENT DATE: 08-02-23   Gaze Adaptation:  x1 Viewing Horizontal: Position: standing, Time: 15 secs, Reps: 1, and Comment: reviewed this exercise as previously issued and x1 Viewing Vertical:  Position: standing, Time: 15 secs, Reps: 1, and Comment: min. Dizziness reported  Access Code: [email or web address] URL: [email or web address] Date: 08/03/2023 Prepared by: Rock Kussmaul  Exercises - Standing Gaze Stabilization with Head Rotation  - 1 x daily - 7 x weekly - 1 sets - 1-2 reps  PATIENT EDUCATION: Education details: gaze stabilization exercise - see above; discussed need for speech eval due to pt's report of decreased memory/cognitive functioning since most recent concussion Person educated: Patient Education method: Explanation, Demonstration, and Handouts Education comprehension: verbalized understanding and returned demonstration  HOME EXERCISE PROGRAM:  GOALS: Goals reviewed with patient? Yes  SHORT TERM GOALS: same as LTG's as ELOS = 4 weeks   LONG TERM GOALS: Target date: 09-07-23  Pt will subjectively report at least 50% improvement in dizziness. Baseline:  Goal status: INITIAL  2.  Pt will perform x1 viewing exercise for 1 horizontal & vertical head turns with no c/o dizziness to demo improved gaze stabilization. Baseline: 2 line difference with DVA but moderate c/o dizziness upon test completion Goal  status: INITIAL  3.  Pt will maintain balance on condition 4  of mCTSIB for 30 secs to demo improved vestibular input in maintaining balance.  Baseline: 5.9 secs (08-02-23) Goal status: INITIAL  4.  Independent in HEP for balance and vestibular exercises. Baseline:  Goal status: INITIAL  5.  Pt will report pain in Lt temple region to </= 2/10 for increased ease with work activities and ADL's. Baseline:  4/10 on 08-02-23 Goal status:  INITIAL  ASSESSMENT:  CLINICAL IMPRESSION: Patient is a 53 y.o. 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  Pt ambulating without device but reports dizziness with motion and quick movements.  Pt's DVA is WNL's with a 2 line difference but pt reported moderate dizziness upon completion of test.  Pt has decreased vestibular input in maintaining balance as evidenced by standing with EC on foam for only 5.9 secs prior to LOB.  Pt will benefit from PT to address dizziness, balance and vesitbular deficits.   OBJECTIVE IMPAIRMENTS: decreased balance, difficulty walking, and dizziness.   ACTIVITY LIMITATIONS: bending, reach over head, and locomotion level  PARTICIPATION LIMITATIONS: cleaning, shopping, community activity, occupation, and yard work  PERSONAL FACTORS: Behavior pattern, Fitness, Past/current experiences, and 1-2 comorbidities: h/o multiple concussions are also affecting patient's functional outcome.   REHAB POTENTIAL: Good  CLINICAL DECISION MAKING: Evolving/moderate complexity  EVALUATION  COMPLEXITY: Moderate   PLAN:  PT FREQUENCY: 1-2x/week  PT DURATION: 4 weeks  PLANNED INTERVENTIONS: [id number]-Therapeutic exercises, 97530- Therapeutic activity, [id number]- Neuromuscular re-education, [phone]- Self Care, [id number]- Gait training, Patient/Family education, and Vestibular training  PLAN FOR NEXT SESSION: visual/vestibular exercises with cognitive component  added Request order for speech eval    Joangel Vanosdol, [place], PT 08/03/2023, 4:16 PM

## 2023-08-07 ENCOUNTER — Ambulatory Visit: Payer: Self-pay | Admitting: Physical Therapy

## 2023-08-07 DIAGNOSIS — R42 Dizziness and giddiness: Secondary | ICD-10-CM

## 2023-08-07 DIAGNOSIS — R2681 Unsteadiness on feet: Secondary | ICD-10-CM

## 2023-08-07 NOTE — Therapy (Unsigned)
 OUTPATIENT PHYSICAL THERAPY VESTIBULAR TREATMENT NOTE     Patient Name: [name] MRN: [id number] DOB:1970-04-18, 53 y.o., female Today's Date: 08/09/2023  END OF SESSION:  PT End of Session - 08/09/23 0800     Visit Number 2    Number of Visits 9    Date for PT Re-Evaluation 09/07/23   extended 1 week due to schedule availability   Authorization Type HTA    Authorization Time Period 08-02-23 - 09-30-23    Progress Note Due on Visit 10    PT Start Time 1535    PT Stop Time 1617    PT Time Calculation (min) 42 min    Activity Tolerance Patient tolerated treatment well    Behavior During Therapy [facility] for tasks assessed/performed           Past Medical History:  Diagnosis Date   Achilles tendinitis    ADHD (attention deficit hyperactivity disorder), inattentive type    Diagnosed as an adult after starting college   Allergy     Arthritis    Arthritis    Ataxia    Back pain    Bipolar I disorder 04/18/2014   most recent episode mixed   Borderline personality disorder 10/27/2011   Cataracts, bilateral    Chest pain    Chronic pain disorder    due to several injuries affecting numerous areas of her body throughout the years   Chronic paroxysmal hemicrania, 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   rt chest wall nodule removal   GANGLION CYST EXCISION  01/30/2009   lipoma removal     right bunioectomy     SHOULDER SURGERY  01/13/2011   right, partial tear   tumor resection left thigh     Patient Active Problem List   Diagnosis Date Noted   Impaired functional mobility, balance, gait, and endurance 03/08/2023   Unexplained falls 03/08/2023   BPV (benign positional vertigo), right 03/08/2023   Other Specified Feeding or Eating Disorder, Emotional and Binge Eating Behaviors 01/15/2023   Pain in joint of right knee 01/15/2023   Other fatigue 09/24/2022   BMI 37.0-37.9, adult 03/14/2022   Shoulder subluxation, left, initial encounter 02/01/2022   Environmental allergies 01/31/2022   Class 3 severe obesity with serious comorbidity and body mass  index (BMI) of 40.0 to 44.9 in adult 01/31/2022   Bipolar disorder (HCC) 11/28/2021   Arthritis of first metatarsophalangeal (MTP) joint of right foot 10/24/2021   Migraines 08/18/2021   Cervical radiculopathy 08/16/2021   Lipoma of left forearm 08/16/2021   Carpal tunnel syndrome on left 08/16/2021   Vitamin D  deficiency 07/26/2021   Eating disorder 07/26/2021   Insulin  resistance 07/06/2021   Other hyperlipidemia 07/06/2021   B12 deficiency 07/06/2021   Foot sprain, left, initial encounter 06/01/2021   Mood disorder (HCC) 04/29/2021   Sleep difficulties 04/29/2021   Other constipation 04/29/2021   Essential hypertension 04/29/2021   Sprain of ankle 03/29/2021   Subacromial bursitis of left shoulder joint 03/29/2021   Concussion with no loss of consciousness  02/07/2021   Lumbar radiculopathy 02/07/2021   Shoulder impingement syndrome, left 11/01/2020   Sprain of metacarpophalangeal joint of left thumb 10/07/2020   Posterior tibial tendinitis of right lower extremity 09/13/2020   Labral tear of hip, degenerative 09/13/2020   Attention and concentration deficit 05/07/2020   Morbid obesity (HCC) 05/07/2020   Plantar wart 05/07/2020   Sleep apnea 05/07/2020   Urinary incontinence 05/07/2020   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   Genetic testing 02/27/2018   Family history of genetic disease carrier    Family history of breast cancer    Family history of colon cancer    History of multiple concussions 11/20/2017   Ataxia 11/20/2017   Cervical strain, acute, initial encounter 09/19/2016   Insomnia 02/17/2015   Right shoulder pain 12/31/2014   Episodic cluster headache, not intractable 12/02/2014   Chronic paroxysmal hemicrania, 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   Photophobia of both eyes 05/04/2014   Nausea with vomiting 05/04/2014   Bipolar I disorder, most recent episode mixed (HCC) 04/18/2014   Suicidal ideation 04/12/2014   Injury of right shoulder and upper arm 02/17/2014   Migraine with status migrainosus 01/08/2013   Chronic migraine 05/08/2012   Hypertension    Contact dermatitis 11/27/2011   Generalized anxiety disorder 10/27/2011   ADHD (attention deficit hyperactivity disorder), 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   Borderline personality disorder (HCC) 10/27/2011   Right foot pain 09/28/2011   Loss of transverse plantar arch 09/01/2011   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   Ganglion cyst 09/29/2009   Pes planus 07/01/2008    PCP: None REFERRING PROVIDER: Dohmeier, Dedra, MD  REFERRING DIAG: Z74.09 (ICD-10-CM) - Impaired functional mobility, balance, gait, and endurance R42 (ICD-10-CM) - Vertigo Z79.899 (ICD-10-CM) - Medication management H81.11 (ICD-10-CM)  - BPV (benign positional vertigo), right  THERAPY DIAG:  Dizziness and giddiness  Unsteadiness on feet  ONSET DATE: 07-14-23  Rationale for Evaluation and Treatment: Rehabilitation  SUBJECTIVE:   SUBJECTIVE STATEMENT: Pt states she has done the letter exercise some - able to do for about 30 secs, not up to a minute yet as it provokes dizziness  Pt accompanied by: self  PERTINENT HISTORY: Bipolar Disorder, ADHD, Achilles Tendinitis, Chronic Pain, Migraines, HIstory of Multiple Concussions, HTN, Insomnia, s/p ACL sx in May 2024 - continues to wear knee brace on RLE  PAIN:  Are you having pain? Yes: NPRS scale: 4/10   Pain location: Lt temple region Pain description: more dull pain now  Aggravating factors: bending over or reaching too far forward on Lt side Relieving factors: Tylenol   PRECAUTIONS: Fall  RED FLAGS: None   WEIGHT BEARING RESTRICTIONS: No  FALLS: Has patient fallen in last 6 months?  No  LIVING ENVIRONMENT: Lives with: lives with their family Lives in: House/apartment Stairs: Yes: External: 3 steps; on right going up Has following equipment at home: None  PLOF: Independent  PATIENT GOALS: improve balance, reduce dizziness  OBJECTIVE:  Note: Objective measures were completed at Evaluation unless otherwise noted.  DIAGNOSTIC FINDINGS: N/A  COGNITION: Overall cognitive status: pt reports more problems with memory and cognition since injury on 07-15-23    POSTURE:  rounded shoulders and forward head  Cervical ROM:  WNL's   STRENGTH: WNL's  LOWER EXTREMITY MMT: WNL's   GAIT: Gait pattern: WFL Distance walked: 59' Assistive device utilized: None Level of assistance: Modified independence Comments:   FUNCTIONAL TESTS:  MCTSIB: Condition 1: Avg of 3 trials: 30 sec, Condition 2: Avg of 3 trials: 30 sec, Condition 3: Avg of 3 trials: 30 sec, Condition 4: Avg of 3 trials: 5.9 sec, and Total Score: 95.9/120    VESTIBULAR ASSESSMENT:  GENERAL  OBSERVATION: pt known to this clinic for vestibular rehab due to h/o previous concussions; pt reports she hit her head on wall hook in bathroom at work on 07-14-23 when she returned to upright standing after bending down to pick up tissue off the floor; pt reports she has moderate tenderness in Lt side of head - Lt temple region; pt reports she continued with her work and didn't think much of it; on Sunday returned to work but was having a hard time - left early due to dizziness - went to ED at [facility]    SYMPTOM BEHAVIOR:  Subjective history: see above - pt reports increased dizziness and memory problems since hitting head on 07-14-23  Non-Vestibular symptoms: headaches and migraine symptoms  Type of dizziness: Imbalance (Disequilibrium), Unsteady with head/body turns, and dull pain in Lt side of head  Frequency: daily  Duration: mostly constant - worse with movement  Aggravating factors: Induced by motion: bending down to the ground and activity in general  Relieving factors: lying supine, rest, and slow movements  Progression of symptoms: better - pain has improved since initial accident  OCULOMOTOR EXAM:  Ocular Alignment: normal  Ocular ROM: No Limitations  Spontaneous Nystagmus: absent  Gaze-Induced Nystagmus: absent  Smooth Pursuits: intact  Saccades: intact  VESTIBULAR - OCULAR REFLEX:   Slow VOR: Normal    Dynamic Visual Acuity: Static: line 9 Dynamic: line 7 - c/o moderate dizziness upon completion of test   POSITIONAL TESTING: Other: NT based on diagnosis of post concussion   MOTION SENSITIVITY:  Motion Sensitivity Quotient Intensity: 0 = none, 1 = Lightheaded, 2 = Mild, 3 = Moderate, 4 = Severe, 5 = Vomiting  Intensity  1. Sitting to supine   2. Supine to L side   3. Supine to R side   4. Supine to sitting   5. L Hallpike-Dix   6. Up from L    7. R Hallpike-Dix   8. Up from R    9. Sitting, head tipped to L knee   10. Head up from L knee   11. Sitting, head  tipped to R knee   12. Head up from R knee   13. Sitting head turns x5   14.Sitting head nods x5   15. In stance, 180 turn to L    16. In stance, 180 turn to R  TREATMENT DATE: 08-09-23  NeuroRe-ed:  -Gaze Adaptation:  x1 Viewing Horizontal: Position: standing, Time: 30 secs, Reps: 2, and Comment:   and x1 Viewing Vertical:  Position: standing, Time: 30 secs, Reps: 2, and Comment: min. Dizziness reported - needed to stop after 30 secs   -Sit to stand from mat with no UE support  - 5 reps with EC with CGA  -Standing Balance:  Pt stood on Airex in corner with EC for 10 secs without head movement - then added head turns with targets with EO for improved gaze stabilization Surface: Airex Position: Feet Hip Width Apart Completed with: Eyes Open and Eyes Closed; Head Turns x 5 Reps and Head Nods x 5 Reps  Standing on Airex initially -(pt able to tolerate reading 1 line only standing on Airex due to dizziness so changed to standing on floor for increased ease/decreased dizziness with activity)- used Shiela Charts - black and white chart initially - pt read lines as called in various sequences 1-5 with Shiela chart moved by PT horizontally and vertically;  then colored Shiela chart used for reading named colors as Shiela chart moved in various patterns in front of patient;  -Pt performed marching in place - tossing and catching ball straight up; added naming animals A-M with standing marching in place and tossing and catching ball- pt reported moderate dizziness upon completion of this activity    Access Code: [email or web address] URL: [email or web address] Date: 08/03/2023 Prepared by: Rock Kussmaul  Exercises - Standing Gaze Stabilization with Head Rotation  - 1 x daily - 7 x weekly - 1 sets - 1-2 reps  PATIENT EDUCATION: Education details: gaze stabilization  exercise - see above; discussed need for speech eval due to pt's report of decreased memory/cognitive functioning since most recent concussion Person educated: Patient Education method: Explanation, Demonstration, and Handouts Education comprehension: verbalized understanding and returned demonstration  HOME EXERCISE PROGRAM:  GOALS: Goals reviewed with patient? Yes  SHORT TERM GOALS: same as LTG's as ELOS = 4 weeks   LONG TERM GOALS: Target date: 09-07-23  Pt will subjectively report at least 50% improvement in dizziness. Baseline:  Goal status: INITIAL  2.  Pt will perform x1 viewing exercise for 1 horizontal & vertical head turns with no c/o dizziness to demo improved gaze stabilization. Baseline: 2 line difference with DVA but moderate c/o dizziness upon test completion Goal status: INITIAL  3.  Pt will maintain balance on condition 4  of mCTSIB for 30 secs to demo improved vestibular input in maintaining balance.  Baseline: 5.9 secs (08-02-23) Goal status: INITIAL  4.  Independent in HEP for balance and vestibular exercises. Baseline:  Goal status: INITIAL  5.  Pt will report pain in Lt temple region to </= 2/10 for increased ease with work activities and ADL's. Baseline:  4/10 on 08-02-23 Goal status:  INITIAL  ASSESSMENT:  CLINICAL IMPRESSION: PT session focused on visual/vestibular exercises to improve gaze stabilization, multi-tasking with cognitive task and balance exercises with increased vestibular input.  Pt reported significant dizziness with reading Shiela chart standing on Airex - changed to standing on floor to decrease dizziness with activity.  Pt also reported significant dizziness provoked with marching in place on floor, tossing/catching ball and naming animals A-M.  Pt requested rest period after cognitive task, which was performed at end of session.  Pt also reports she is under a lot of stress with her job and caring for her mother, which likely contributes to  her dizziness.  Cont with  POC.  OBJECTIVE IMPAIRMENTS: decreased balance, difficulty walking, and dizziness.   ACTIVITY LIMITATIONS: bending, reach over head, and locomotion level  PARTICIPATION LIMITATIONS: cleaning, shopping, community activity, occupation, and yard work  PERSONAL FACTORS: Behavior pattern, Fitness, Past/current experiences, and 1-2 comorbidities: h/o multiple concussions are also affecting patient's functional outcome.   REHAB POTENTIAL: Good  CLINICAL DECISION MAKING: Evolving/moderate complexity  EVALUATION COMPLEXITY: Moderate   PLAN:  PT FREQUENCY: 1-2x/week  PT DURATION: 4 weeks  PLANNED INTERVENTIONS: 97110-Therapeutic exercises, 97530- Therapeutic activity, [id number]- Neuromuscular re-education, [phone]- Self Care, [id number]- Gait training, Patient/Family education, and Vestibular training  PLAN FOR NEXT SESSION: visual/vestibular exercises with cognitive component added; ST referral requested on 08-08-23    Gay Rape Suzanne, PT 08/09/2023, 8:02 AM

## 2023-08-08 ENCOUNTER — Telehealth: Payer: Self-pay | Admitting: Physical Therapy

## 2023-08-08 DIAGNOSIS — R4789 Other speech disturbances: Secondary | ICD-10-CM

## 2023-08-08 NOTE — Telephone Encounter (Signed)
 Dr. Chalice,  [name] is receiving OP PT for post concussion syndrome.  She reports more cognitive problems since most recent concussion sustained on 07-14-23; she states she has experienced more memory deficits and occasional difficulty with word finding. I feel she would benefit from a ST evaluation, and she is requesting this service.  If you agree would you please place an order in Epic for ST eval & treat and we will get her scheduled.  Thank you, Elvie Kussmaul, PT

## 2023-08-09 ENCOUNTER — Encounter: Payer: Self-pay | Admitting: Physical Therapy

## 2023-08-13 ENCOUNTER — Encounter: Payer: Self-pay | Admitting: Neurology

## 2023-08-13 ENCOUNTER — Ambulatory Visit: Payer: Self-pay | Admitting: Physical Therapy

## 2023-08-13 ENCOUNTER — Encounter: Payer: Self-pay | Admitting: Physical Therapy

## 2023-08-13 ENCOUNTER — Other Ambulatory Visit: Payer: Self-pay | Admitting: Adult Health

## 2023-08-13 DIAGNOSIS — R42 Dizziness and giddiness: Secondary | ICD-10-CM

## 2023-08-13 DIAGNOSIS — R2681 Unsteadiness on feet: Secondary | ICD-10-CM

## 2023-08-13 NOTE — Therapy (Unsigned)
 OUTPATIENT PHYSICAL THERAPY VESTIBULAR TREATMENT NOTE     Patient Name: [name] MRN: [id number] DOB:[birth year], 53 y.o., female Today's Date: 08/15/2023  END OF SESSION:  PT End of Session - 08/15/23 0823     Visit Number 3    Number of Visits 9    Date for PT Re-Evaluation 09/07/23   extended 1 week due to schedule availability   Authorization Type HTA    Authorization Time Period 08-02-23 - 09-30-23    Progress Note Due on Visit 10    PT Start Time 0847    PT Stop Time 0928    PT Time Calculation (min) 41 min    Activity Tolerance Patient tolerated treatment well    Behavior During Therapy [facility] for tasks assessed/performed            Past Medical History:  Diagnosis Date   Achilles tendinitis    ADHD (attention deficit hyperactivity disorder), inattentive type    Diagnosed as an adult after starting college   Allergy     Arthritis    Arthritis    Ataxia    Back pain    Bipolar I disorder 04/18/2014   most recent episode mixed   Borderline personality disorder 10/27/2011   Cataracts, bilateral    Chest pain    Chronic pain disorder    due to several injuries affecting numerous areas of her body throughout the years   Chronic paroxysmal hemicrania, 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   rt chest wall nodule removal   GANGLION CYST EXCISION  01/30/2009   lipoma removal     right bunioectomy     SHOULDER SURGERY  01/13/2011   right, partial tear   tumor resection left thigh     Patient Active Problem List   Diagnosis Date Noted   Impaired functional mobility, balance, gait, and endurance 03/08/2023   Unexplained falls 03/08/2023   BPV (benign positional vertigo), right 03/08/2023   Other Specified Feeding or Eating Disorder, Emotional and Binge Eating Behaviors 01/15/2023   Pain in joint of right knee 01/15/2023   Other fatigue 09/24/2022   BMI 37.0-37.9, adult 03/14/2022   Shoulder subluxation, left, initial encounter 02/01/2022   Environmental allergies 01/31/2022   Class 3 severe obesity with serious comorbidity and body  mass index (BMI) of 40.0 to 44.9 in adult 01/31/2022   Bipolar disorder (HCC) 11/28/2021   Arthritis of first metatarsophalangeal (MTP) joint of right foot 10/24/2021   Migraines 08/18/2021   Cervical radiculopathy 08/16/2021   Lipoma of left forearm 08/16/2021   Carpal tunnel syndrome on left 08/16/2021   Vitamin D  deficiency 07/26/2021   Eating disorder 07/26/2021   Insulin  resistance 07/06/2021   Other hyperlipidemia 07/06/2021   B12 deficiency 07/06/2021   Foot sprain, left, initial encounter 06/01/2021   Mood disorder (HCC) 04/29/2021   Sleep difficulties 04/29/2021   Other constipation 04/29/2021   Essential hypertension 04/29/2021   Sprain of ankle 03/29/2021   Subacromial bursitis of left shoulder joint 03/29/2021   Concussion with no loss of consciousness  02/07/2021   Lumbar radiculopathy 02/07/2021   Shoulder impingement syndrome, left 11/01/2020   Sprain of metacarpophalangeal joint of left thumb 10/07/2020   Posterior tibial tendinitis of right lower extremity 09/13/2020   Labral tear of hip, degenerative 09/13/2020   Attention and concentration deficit 05/07/2020   Morbid obesity (HCC) 05/07/2020   Plantar wart 05/07/2020   Sleep apnea 05/07/2020   Urinary incontinence 05/07/2020   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   Genetic testing 02/27/2018   Family history of genetic disease carrier    Family history of breast cancer    Family history of colon cancer    History of multiple concussions 11/20/2017   Ataxia 11/20/2017   Cervical strain, acute, initial encounter 09/19/2016   Insomnia 02/17/2015   Right shoulder pain 12/31/2014   Episodic cluster headache, not intractable 12/02/2014   Chronic paroxysmal hemicrania, 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   Photophobia of both eyes 05/04/2014   Nausea with vomiting 05/04/2014   Bipolar I disorder, most recent episode mixed (HCC) 04/18/2014   Suicidal ideation 04/12/2014   Injury of right shoulder and upper arm 02/17/2014   Migraine with status migrainosus 01/08/2013   Chronic migraine 05/08/2012   Hypertension    Contact dermatitis 11/27/2011   Generalized anxiety disorder 10/27/2011   ADHD (attention deficit hyperactivity disorder), 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   Borderline personality disorder (HCC) 10/27/2011   Right foot pain 09/28/2011   Loss of transverse plantar arch 09/01/2011   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   Ganglion cyst 09/29/2009   Pes planus 07/01/2008    PCP: None REFERRING PROVIDER: Dohmeier, Dedra, MD  REFERRING DIAG: Z74.09 (ICD-10-CM) - Impaired functional mobility, balance, gait, and endurance R42 (ICD-10-CM) - Vertigo Z79.899 (ICD-10-CM) - Medication management H81.11 (ICD-10-CM)  - BPV (benign positional vertigo), right  THERAPY DIAG:  Dizziness and giddiness  Unsteadiness on feet  ONSET DATE: 07-14-23  Rationale for Evaluation and Treatment: Rehabilitation  SUBJECTIVE:   SUBJECTIVE STATEMENT: Pt reports she did not do exercises over the weekend because I tweaked my back; worked 6 hours on Sunday Pt accompanied by: self  PERTINENT HISTORY: Bipolar Disorder, ADHD, Achilles Tendinitis, Chronic Pain, Migraines, HIstory of Multiple Concussions, HTN, Insomnia, s/p ACL sx in May 2024 - continues to wear knee brace on RLE  PAIN:  Are you having pain? Yes: NPRS scale: 4/10   Pain location: Lt temple region Pain description: more dull pain now  Aggravating factors: bending over or reaching too far forward on Lt side Relieving factors: Tylenol   PRECAUTIONS: Fall  RED FLAGS: None   WEIGHT BEARING RESTRICTIONS: No  FALLS: Has patient fallen in last 6 months? No  LIVING ENVIRONMENT: Lives with: lives  with their family Lives in: House/apartment Stairs: Yes: External: 3 steps; on right going up Has following equipment at home: None  PLOF: Independent  PATIENT GOALS: improve balance, reduce dizziness  OBJECTIVE:  Note: Objective measures were completed at Evaluation unless otherwise noted.  DIAGNOSTIC FINDINGS: N/A  COGNITION: Overall cognitive status: pt reports more problems with memory and cognition since injury on 07-15-23    POSTURE:  rounded shoulders and forward head  Cervical ROM:  WNL's   STRENGTH: WNL's  LOWER EXTREMITY MMT: WNL's   GAIT: Gait pattern: WFL Distance walked: 53' Assistive device utilized: None Level of assistance: Modified independence Comments:   FUNCTIONAL TESTS:  MCTSIB: Condition 1: Avg of 3 trials: 30 sec, Condition 2: Avg of 3 trials: 30 sec, Condition 3: Avg of 3 trials: 30 sec, Condition 4: Avg of 3 trials: 5.9 sec, and Total Score: 95.9/120    VESTIBULAR ASSESSMENT:  GENERAL OBSERVATION: pt known  to this clinic for vestibular rehab due to h/o previous concussions; pt reports she hit her head on wall hook in bathroom at work on 07-14-23 when she returned to upright standing after bending down to pick up tissue off the floor; pt reports she has moderate tenderness in Lt side of head - Lt temple region; pt reports she continued with her work and didn't think much of it; on Sunday returned to work but was having a hard time - left early due to dizziness - went to ED at [facility]    SYMPTOM BEHAVIOR:  Subjective history: see above - pt reports increased dizziness and memory problems since hitting head on 07-14-23  Non-Vestibular symptoms: headaches and migraine symptoms  Type of dizziness: Imbalance (Disequilibrium), Unsteady with head/body turns, and dull pain in Lt side of head  Frequency: daily  Duration: mostly constant - worse with movement  Aggravating factors: Induced by motion: bending down to the ground and activity in general  Relieving factors: lying supine, rest, and slow movements  Progression of symptoms: better - pain has improved since initial accident  OCULOMOTOR EXAM:  Ocular Alignment: normal  Ocular ROM: No Limitations  Spontaneous Nystagmus: absent  Gaze-Induced Nystagmus: absent  Smooth Pursuits: intact  Saccades: intact  VESTIBULAR - OCULAR REFLEX:   Slow VOR: Normal    Dynamic Visual Acuity: Static: line 9 Dynamic: line 7 - c/o moderate dizziness upon completion of test   POSITIONAL TESTING: Other: NT based on diagnosis of post concussion   MOTION SENSITIVITY:  Motion Sensitivity Quotient Intensity: 0 = none, 1 = Lightheaded, 2 = Mild, 3 = Moderate, 4 = Severe, 5 = Vomiting  Intensity  1. Sitting to supine   2. Supine to L side   3. Supine to R side   4. Supine to sitting   5. L Hallpike-Dix   6. Up from L    7. R Hallpike-Dix   8. Up from R    9. Sitting, head tipped to L knee   10. Head up from L knee   11. Sitting, head tipped to R knee   12.  Head up from R knee   13. Sitting head turns x5   14.Sitting head nods x5   15. In stance, 180 turn to L    16. In stance, 180 turn to R  TREATMENT DATE: 08-13-23  NeuroRe-ed:  -Gaze Adaptation:  x1 Viewing Horizontal: Position: standing, Time: 45 secs, Reps: 2, and Comment:   and x1 Viewing Vertical:  Position: standing, Time: 45 secs, Reps: 2, and Comment: min. Dizziness reported - increased time from 30 secs to 45 secs with this exercise   -Standing Balance:  Pt stood on Airex in corner with EC for 10 secs without head movement - then added head turns with targets with EO for improved gaze stabilization Surface: Airex Position: Feet Hip Width Apart Completed with: Eyes Open and Eyes Closed; Head Turns x 5 Reps and Head Nods x 5 Reps  Standing on Airex in corner - pt read called lines on colored [place] chart for improved gaze stabilization; progressed to PT moving [place] chart in various patterns - horizontally, vertically and diagonally in front of patient while standing on Airex for improved visual/vestibular input;   colored Shiela chart used for reading named colors as Shiela chart moved slowly in front of patient - standing on Airex;  attempted convergence exercise with Shiela chart while standing on Airex- pt reported nausea with this exercise, requiring seated rest period after these oculomotor exercises with standing on compliant surface  -Pt performed ambulation approx. 30' in hallway tossing and catching ball; progressed to tossing on Rt side/Lt side for improved balance with turning & head turns - 35' x 1 rep Performed amb. With tracking ball 30' x 1 rep in clockwise direction - 1 rep - due to dizziness provoked     Access Code: [email or web address] URL: [email or web address] Date: 08/03/2023 Prepared by: Rock Kussmaul  Exercises - Standing Gaze  Stabilization with Head Rotation  - 1 x daily - 7 x weekly - 1 sets - 1-2 reps  PATIENT EDUCATION: Education details: gaze stabilization exercise - see above; discussed need for speech eval due to pt's report of decreased memory/cognitive functioning since most recent concussion Person educated: Patient Education method: Explanation, Demonstration, and Handouts Education comprehension: verbalized understanding and returned demonstration  HOME EXERCISE PROGRAM:  GOALS: Goals reviewed with patient? Yes  SHORT TERM GOALS: same as LTG's as ELOS = 4 weeks   LONG TERM GOALS: Target date: 09-07-23  Pt will subjectively report at least 50% improvement in dizziness. Baseline:  Goal status: INITIAL  2.  Pt will perform x1 viewing exercise for 1 horizontal & vertical head turns with no c/o dizziness to demo improved gaze stabilization. Baseline: 2 line difference with DVA but moderate c/o dizziness upon test completion Goal status: INITIAL  3.  Pt will maintain balance on condition 4  of mCTSIB for 30 secs to demo improved vestibular input in maintaining balance.  Baseline: 5.9 secs (08-02-23) Goal status: INITIAL  4.  Independent in HEP for balance and vestibular exercises. Baseline:  Goal status: INITIAL  5.  Pt will report pain in Lt temple region to </= 2/10 for increased ease with work activities and ADL's. Baseline:  4/10 on 08-02-23 Goal status:  INITIAL  ASSESSMENT:  CLINICAL IMPRESSION: PT session focused on visual/vestibular exercises to improve gaze stabilization and balance exercises with increased vestibular input incorporated.  Pt had difficulty performing convergence exercise while standing on compliant surface, which was performed after gaze stabilization exercise with moving targets.  Pt reported nausea provoked with these exercises and required seated rest period.  Pt able to increase time from 30 secs to 45 secs with x1 viewing exercise in standing, for both horizontal and  vertical directions.  Cont with POC.  OBJECTIVE IMPAIRMENTS:  decreased balance, difficulty walking, and dizziness.   ACTIVITY LIMITATIONS: bending, reach over head, and locomotion level  PARTICIPATION LIMITATIONS: cleaning, shopping, community activity, occupation, and yard work  PERSONAL FACTORS: Behavior pattern, Fitness, Past/current experiences, and 1-2 comorbidities: h/o multiple concussions are also affecting patient's functional outcome.   REHAB POTENTIAL: Good  CLINICAL DECISION MAKING: Evolving/moderate complexity  EVALUATION COMPLEXITY: Moderate   PLAN:  PT FREQUENCY: 1-2x/week  PT DURATION: 4 weeks  PLANNED INTERVENTIONS: 97110-Therapeutic exercises, 97530- Therapeutic activity, [id number]- Neuromuscular re-education, [phone]- Self Care, [id number]- Gait training, Patient/Family education, and Vestibular training  PLAN FOR NEXT SESSION: visual/vestibular exercises with cognitive component added    Josphine Laffey, [place], PT 08/15/2023, 8:24 AM

## 2023-08-14 ENCOUNTER — Telehealth (INDEPENDENT_AMBULATORY_CARE_PROVIDER_SITE_OTHER): Admitting: Psychology

## 2023-08-14 ENCOUNTER — Telehealth: Payer: Self-pay | Admitting: Adult Health

## 2023-08-14 DIAGNOSIS — F319 Bipolar disorder, unspecified: Secondary | ICD-10-CM

## 2023-08-14 DIAGNOSIS — F909 Attention-deficit hyperactivity disorder, unspecified type: Secondary | ICD-10-CM

## 2023-08-14 DIAGNOSIS — F5089 Other specified eating disorder: Secondary | ICD-10-CM | POA: Diagnosis not present

## 2023-08-14 DIAGNOSIS — M1711 Unilateral primary osteoarthritis, right knee: Secondary | ICD-10-CM | POA: Diagnosis not present

## 2023-08-14 NOTE — Progress Notes (Signed)
  Office: [phone]  /  Fax: [phone]    Date: August 14, 2023  Appointment Start Time: 2:03pm Duration: 31 minutes Provider: Wyatt Fire, Psy.D. Type of Session: Individual Therapy  Location of Patient: Home (private location) Location of Provider: Provider's Home (private office) Type of Contact: Telepsychological Visit via MyChart Video Visit  Session Content: [name] is a 53 y.o. female presenting for a follow-up appointment to address the previously established treatment goal of increasing coping skills.Today's appointment was a telepsychological visit. [name] provided verbal consent for today's telepsychological appointment and she is aware she is responsible for securing confidentiality on her end of the session. Prior to proceeding with today's appointment, [name]'s physical location at the time of this appointment was obtained as well a phone number she could be reached at in the event of technical difficulties. Darren and this provider participated in today's telepsychological service.   This provider conducted a brief check-in. [name] indicated, I can't afford a therapist. She explained her hours were reduced at work causing financial strain. Further explored and she was encouraged to call her insurance company for further clarification. She agreed. [name] also discussed feeling depressed due to changes at work, being placed on FMLA by work, undergoing a 3-year disability review, and initiating physical therapy for her shoulder and vestibular therapy. Notably, she learned about a job opportunity with [employer] that she is considering as it would be Monday-Friday, adding she has an Copy. She also shared the braces on her knees were removed today. Moreover, [name] stated she is maintaining her weight, noting she is experiencing fluctuations in her appetite. Due to ongoing stress, she was encouraged to journal daily and note three things she is grateful for. Overall,   [name] was receptive to today's appointment as evidenced by openness to sharing and responsiveness to feedback.  Mental Status Examination:  Appearance: neat Behavior: appropriate to circumstances Mood: depressed Affect: mood congruent Speech: WNL Eye Contact: appropriate Psychomotor Activity: WNL Gait: unable to assess Thought Process: linear, logical, and goal directed and denies suicidal, homicidal, and self-harm ideation, plan and intent since the last appointment with this provider  Thought Content/Perception: no hallucinations, delusions, bizarre thinking or behavior endorsed or observed Orientation: AAOx4 Memory/Concentration: intact Insight: fair Judgment: fair  Interventions:  Conducted a brief chart review Conducted a risk assessment Provided empathic reflections and validation Reviewed content from the previous session Provided positive reinforcement Employed supportive psychotherapy interventions to facilitate reduced distress and to improve coping skills with identified stressors  DSM-5 Diagnosis(es): F50.89 Other Specified Feeding or Eating Disorder, Emotional and Binge Eating Behaviors, F90.9 Unspecified Attention-Deficit/Hyperactivity Disorder , and F31.9 Unspecified Bipolar and Related Disorder  Treatment Goal & Progress: [name] re-established care with this provider and the following treatment goal was established: increase coping skills. [name] has demonstrated progress in her goal as evidenced by her self-report of reducing soda intake, willingness to implement mindfulness-based strategies, and willingness to implement other learned strategies (e.g., urge surfing, guided imagery).   Plan: The next appointment is scheduled for 08/27/2023 at 3pm, which will be via MyChart Video Visit. The next session will focus on working towards the established treatment goal. [name] will call her insurance today to determine if she can initiate services with [facility].   Wyatt Fire, PsyD

## 2023-08-14 NOTE — Telephone Encounter (Signed)
 I called the patient.  I received a refill request on verapamil .  This was started in 2023 for her migraines.  She states that she has been off the medicine for 3 days.  She would actually like to try to stay off the medicine to see if it still needed.  I am amendable to that.  I have an appointment with her in August.  Certainly if her headaches worsen we can consider adding this medication back.

## 2023-08-15 ENCOUNTER — Encounter: Payer: Self-pay | Admitting: Physical Therapy

## 2023-08-15 ENCOUNTER — Ambulatory Visit: Admitting: Licensed Clinical Social Worker

## 2023-08-15 ENCOUNTER — Other Ambulatory Visit

## 2023-08-16 ENCOUNTER — Encounter: Payer: Self-pay | Admitting: Physical Therapy

## 2023-08-16 ENCOUNTER — Ambulatory Visit: Payer: Self-pay | Admitting: Physical Therapy

## 2023-08-16 DIAGNOSIS — R42 Dizziness and giddiness: Secondary | ICD-10-CM

## 2023-08-16 DIAGNOSIS — R2681 Unsteadiness on feet: Secondary | ICD-10-CM

## 2023-08-16 NOTE — Therapy (Unsigned)
 OUTPATIENT PHYSICAL THERAPY VESTIBULAR TREATMENT NOTE     Patient Name: [name] MRN: [id number] DOB:[birth year], 53 y.o., female Today's Date: 08/17/2023  END OF SESSION:  PT End of Session - 08/17/23 1326     Visit Number 4    Number of Visits 9    Date for PT Re-Evaluation 09/07/23   extended 1 week due to schedule availability   Authorization Type HTA    Authorization Time Period 08-02-23 - 09-30-23    Progress Note Due on Visit 10    PT Start Time [phone]    PT Stop Time 0930    PT Time Calculation (min) 44 min    Activity Tolerance Patient tolerated treatment well    Behavior During Therapy [facility] for tasks assessed/performed             Past Medical History:  Diagnosis Date   Achilles tendinitis    ADHD (attention deficit hyperactivity disorder), inattentive type    Diagnosed as an adult after starting college   Allergy     Arthritis    Arthritis    Ataxia    Back pain    Bipolar I disorder 04/18/2014   most recent episode mixed   Borderline personality disorder 10/27/2011   Cataracts, bilateral    Chest pain    Chronic pain disorder    due to several injuries affecting numerous areas of her body throughout the years   Chronic paroxysmal hemicrania, 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   rt chest wall nodule removal   GANGLION CYST EXCISION  01/30/2009   lipoma removal     right bunioectomy     SHOULDER SURGERY  01/13/2011   right, partial tear   tumor resection left thigh     Patient Active Problem List   Diagnosis Date Noted   Impaired functional mobility, balance, gait, and endurance 03/08/2023   Unexplained falls 03/08/2023   BPV (benign positional vertigo), right 03/08/2023   Other Specified Feeding or Eating Disorder, Emotional and Binge Eating Behaviors 01/15/2023   Pain in joint of right knee 01/15/2023   Other fatigue 09/24/2022   BMI 37.0-37.9, adult 03/14/2022   Shoulder subluxation, left, initial encounter 02/01/2022   Environmental allergies 01/31/2022   Class 3 severe obesity with serious comorbidity and  body mass index (BMI) of 40.0 to 44.9 in adult 01/31/2022   Bipolar disorder (HCC) 11/28/2021   Arthritis of first metatarsophalangeal (MTP) joint of right foot 10/24/2021   Migraines 08/18/2021   Cervical radiculopathy 08/16/2021   Lipoma of left forearm 08/16/2021   Carpal tunnel syndrome on left 08/16/2021   Vitamin D  deficiency 07/26/2021   Eating disorder 07/26/2021   Insulin  resistance 07/06/2021   Other hyperlipidemia 07/06/2021   B12 deficiency 07/06/2021   Foot sprain, left, initial encounter 06/01/2021   Mood disorder (HCC) 04/29/2021   Sleep difficulties 04/29/2021   Other constipation 04/29/2021   Essential hypertension 04/29/2021   Sprain of ankle 03/29/2021   Subacromial bursitis of left shoulder joint 03/29/2021   Concussion with no loss of consciousness  02/07/2021   Lumbar radiculopathy 02/07/2021   Shoulder impingement syndrome, left 11/01/2020   Sprain of metacarpophalangeal joint of left thumb 10/07/2020   Posterior tibial tendinitis of right lower extremity 09/13/2020   Labral tear of hip, degenerative 09/13/2020   Attention and concentration deficit 05/07/2020   Morbid obesity (HCC) 05/07/2020   Plantar wart 05/07/2020   Sleep apnea 05/07/2020   Urinary incontinence 05/07/2020   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   Genetic testing 02/27/2018   Family history of genetic disease carrier    Family history of breast cancer    Family history of colon cancer    History of multiple concussions 11/20/2017   Ataxia 11/20/2017   Cervical strain, acute, initial encounter 09/19/2016   Insomnia 02/17/2015   Right shoulder pain 12/31/2014   Episodic cluster headache, not intractable 12/02/2014   Chronic paroxysmal hemicrania, 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   Photophobia of both eyes 05/04/2014   Nausea with vomiting 05/04/2014   Bipolar I disorder, most recent episode mixed (HCC) 04/18/2014   Suicidal ideation 04/12/2014   Injury of right shoulder and upper arm 02/17/2014   Migraine with status migrainosus 01/08/2013   Chronic migraine 05/08/2012   Hypertension    Contact dermatitis 11/27/2011   Generalized anxiety disorder 10/27/2011   ADHD (attention deficit hyperactivity disorder), 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   Borderline personality disorder (HCC) 10/27/2011   Right foot pain 09/28/2011   Loss of transverse plantar arch 09/01/2011   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   Ganglion cyst 09/29/2009   Pes planus 07/01/2008    PCP: None REFERRING PROVIDER: Dohmeier, Dedra, MD  REFERRING DIAG: Z74.09 (ICD-10-CM) - Impaired functional mobility, balance, gait, and endurance R42 (ICD-10-CM) - Vertigo Z79.899 (ICD-10-CM) - Medication management H81.11 (ICD-10-CM)  - BPV (benign positional vertigo), right  THERAPY DIAG:  Dizziness and giddiness  Unsteadiness on feet  ONSET DATE: 07-14-23  Rationale for Evaluation and Treatment: Rehabilitation  SUBJECTIVE:   SUBJECTIVE STATEMENT: Pt reports she had another episode of dizziness yesterday while sitting down - lasted approx. 15-20; thinks this may have been caused by having too much milk in her protein shake.  Pt reports feeling better today than she did on Monday at PT appt.  - has a job interview with [employer] later today Pt accompanied by: self  PERTINENT HISTORY: Bipolar Disorder, ADHD, Achilles Tendinitis, Chronic Pain, Migraines, HIstory of Multiple Concussions, HTN, Insomnia, s/p ACL sx in May 2024 - continues to wear knee brace on RLE  PAIN: No pain reported on 08-16-23 Are you having pain? Yes: NPRS scale: 0/10   Pain location: Lt temple region Pain description: more dull pain now  Aggravating factors: bending over  or reaching too far forward on Lt side Relieving factors: Tylenol   PRECAUTIONS: Fall  RED FLAGS: None   WEIGHT BEARING RESTRICTIONS: No  FALLS: Has patient fallen in last 6 months? No  LIVING ENVIRONMENT: Lives with: lives with their family Lives in: House/apartment Stairs: Yes: External: 3 steps; on right going up Has following equipment at home: None  PLOF: Independent  PATIENT GOALS: improve balance, reduce dizziness  OBJECTIVE:  Note: Objective measures were completed at Evaluation unless otherwise noted.  DIAGNOSTIC FINDINGS: N/A  COGNITION: Overall cognitive status: pt reports more problems with memory and cognition since injury on 07-15-23    POSTURE:  rounded shoulders and forward head  Cervical ROM:  WNL's   STRENGTH: WNL's  LOWER EXTREMITY MMT: WNL's   GAIT: Gait pattern: WFL Distance walked: 73' Assistive device utilized: None Level of assistance: Modified independence Comments:   FUNCTIONAL TESTS:  MCTSIB: Condition 1: Avg of 3  trials: 30 sec, Condition 2: Avg of 3 trials: 30 sec, Condition 3: Avg of 3 trials: 30 sec, Condition 4: Avg of 3 trials: 5.9 sec, and Total Score: 95.9/120    VESTIBULAR ASSESSMENT:  GENERAL OBSERVATION: pt known to this clinic for vestibular rehab due to h/o previous concussions; pt reports she hit her head on wall hook in bathroom at work on 07-14-23 when she returned to upright standing after bending down to pick up tissue off the floor; pt reports she has moderate tenderness in Lt side of head - Lt temple region; pt reports she continued with her work and didn't think much of it; on Sunday returned to work but was having a hard time - left early due to dizziness - went to ED at [facility]    SYMPTOM BEHAVIOR:  Subjective history: see above - pt reports increased dizziness and memory problems since hitting head on 07-14-23  Non-Vestibular symptoms: headaches and migraine symptoms  Type of dizziness: Imbalance (Disequilibrium), Unsteady with head/body turns, and dull pain in Lt side of head  Frequency: daily  Duration: mostly constant - worse with movement  Aggravating factors: Induced by motion: bending down to the ground and activity in general  Relieving factors: lying supine, rest, and slow movements  Progression of symptoms: better - pain has improved since initial accident  OCULOMOTOR EXAM:  Ocular Alignment: normal  Ocular ROM: No Limitations  Spontaneous Nystagmus: absent  Gaze-Induced Nystagmus: absent  Smooth Pursuits: intact  Saccades: intact  VESTIBULAR - OCULAR REFLEX:   Slow VOR: Normal    Dynamic Visual Acuity: Static: line 9 Dynamic: line 7 - c/o moderate dizziness upon completion of test   POSITIONAL TESTING: Other: NT based on diagnosis of post concussion   MOTION SENSITIVITY:  Motion Sensitivity Quotient Intensity: 0 = none, 1 = Lightheaded, 2 = Mild, 3 = Moderate, 4 = Severe, 5 = Vomiting  Intensity  1. Sitting to supine   2. Supine to L side   3. Supine  to R side   4. Supine to sitting   5. L Hallpike-Dix   6. Up from L    7. R Hallpike-Dix   8. Up from R    9. Sitting, head tipped to L knee   10. Head up from L knee   11. Sitting, head tipped to R knee   12. Head up from R knee   13. Sitting head turns x5   14.Sitting head nods x5   15. In stance, 180 turn to L    16. In stance,  180 turn to R                                                                                                                                 TREATMENT DATE: 08-16-23  NeuroRe-ed:  -Gaze Adaptation:  x1 Viewing Horizontal: Position: standing, Time: 60 secs, Reps: 1, and Comment:   and x1 Viewing Vertical:  Position: standing, Time: 60 secs, Reps: 1, and Comment: min. Dizziness reported - pt reports more dizziness provoked with horizontal head turns than with vertical head turns   -Standing Balance:  Pt stood on Airex in corner with EC for 10 secs without head movement - then added head turns with targets with EO for improved gaze stabilization Surface: Airex Position: Feet Hip Width Apart Completed with: Eyes Open and Eyes Closed; Head Turns x 5 Reps and Head Nods x 5 Reps  Ambulating approx. 30' reading letters of called color on Hart chart - 2 reps; then performed reading lines of black/white Shiela chart while ambulating 30' - 2 reps;  pt reported dizziness after completion of this exercise  Standing on Airex in corner - performed head turns 5 reps horizontally, then 5 reps vertically with EO; then with EC  Rocker board - 10 reps with EO, then 10 reps with EC with bil. UE support on // bars  -Pt performed ambulation approx. 30' in hallway tossing and catching ball; progressed to tossing on Rt side/Lt side for improved balance with turning & head turns - 35' x 1 rep Performed amb. With tracking ball 30' x 1 rep in clockwise direction - 1 rep; then 1 rep moving ball in counter clockwise direction- 12' with CGA    Access Code: [email or web address] URL:  [email or web address] Date: 08/03/2023 Prepared by: Rock Kussmaul  Exercises - Standing Gaze Stabilization with Head Rotation  - 1 x daily - 7 x weekly - 1 sets - 1-2 reps  PATIENT EDUCATION: Education details: gaze stabilization exercise - increased time for x1 viewing from 45 secs to 60 secs  Person educated: Patient Education method: Explanation, Demonstration, and Handouts Education comprehension: verbalized understanding and returned demonstration  HOME EXERCISE PROGRAM:  GOALS: Goals reviewed with patient? Yes  SHORT TERM GOALS: same as LTG's as ELOS = 4 weeks   LONG TERM GOALS: Target date: 09-07-23  Pt will subjectively report at least 50% improvement in dizziness. Baseline:  Goal status: INITIAL  2.  Pt will perform x1 viewing exercise for 1 horizontal & vertical head turns with no c/o dizziness to demo improved gaze stabilization. Baseline: 2 line difference with DVA but moderate c/o dizziness upon test completion Goal status: INITIAL  3.  Pt will maintain balance on condition 4  of mCTSIB for 30 secs to demo improved vestibular input in maintaining balance.  Baseline: 5.9 secs (08-02-23) Goal status: INITIAL  4.  Independent in HEP for balance and vestibular exercises. Baseline:  Goal status: INITIAL  5.  Pt will  report pain in Lt temple region to </= 2/10 for increased ease with work activities and ADL's. Baseline:  4/10 on 08-02-23 Goal status:  INITIAL  ASSESSMENT:  CLINICAL IMPRESSION: PT session focused on visual/vestibular exercises to improve gaze stabilization and balance exercises with increased vestibular input incorporated.  Pt is most challenged by visual/vestibular exercises such as ambulating reading [employer].  Pt demonstrated progress with gaze stabilization exercise in today's session with able to increase time from 45 secs to 60 secs.  Pt continues to c/o dizziness provoked with vestibular exercises, requiring short seated rest  period to allow dizziness to subside.  Cont with POC.  OBJECTIVE IMPAIRMENTS: decreased balance, difficulty walking, and dizziness.   ACTIVITY LIMITATIONS: bending, reach over head, and locomotion level  PARTICIPATION LIMITATIONS: cleaning, shopping, community activity, occupation, and yard work  PERSONAL FACTORS: Behavior pattern, Fitness, Past/current experiences, and 1-2 comorbidities: h/o multiple concussions are also affecting patient's functional outcome.   REHAB POTENTIAL: Good  CLINICAL DECISION MAKING: Evolving/moderate complexity  EVALUATION COMPLEXITY: Moderate   PLAN:  PT FREQUENCY: 1-2x/week  PT DURATION: 4 weeks  PLANNED INTERVENTIONS: 97110-Therapeutic exercises, 97530- Therapeutic activity, [id number]- Neuromuscular re-education, [phone]- Self Care, [id number]- Gait training, Patient/Family education, and Vestibular training  PLAN FOR NEXT SESSION: visual/vestibular exercises with cognitive component added    Shirah Roseman, [place], PT 08/17/2023, 1:27 PM

## 2023-08-17 ENCOUNTER — Other Ambulatory Visit: Payer: Self-pay | Admitting: Neurology

## 2023-08-17 ENCOUNTER — Encounter: Payer: Self-pay | Admitting: Physical Therapy

## 2023-08-21 DIAGNOSIS — M25512 Pain in left shoulder: Secondary | ICD-10-CM | POA: Diagnosis not present

## 2023-08-26 NOTE — Progress Notes (Unsigned)
 SUBJECTIVE: Discussed the use of AI scribe software for clinical note transcription with the patient, who gave verbal consent to proceed.  Chief Complaint: Obesity  Interim History: She is up 6 lbs since her last visit.   Darneisha is here to discuss her progress with her obesity treatment plan. She is on the Category 2 Plan and states she is following her eating plan approximately 65 % of the time. She states she is not exercising.  Tanayia Wahlquist Gadea is a 53 year old female who presents for follow-up of her obesity treatment plan.  She experienced a concussion on June 14th after hitting her head above the left temple on a purse hook at work. Since then, she has been feeling depressed and has difficulty affording therapy for her mental health concerns.  She reports memory issues, such as forgetting things and confusing locations. She also reports a lack of appetite and occasional nausea, particularly during vestibular therapy sessions which she reports were started after her most recent concussion.   Despite a lack of appetite, she has gained six pounds, but does report not meeting her protein goals and increased carbohydrate intake. Her diet includes half a muffin for breakfast, tomato sandwiches with [place] or ham for lunch, and sometimes beans or salads for dinner. She drinks protein shakes, consuming half with milk twice a day. She has not been able to exercise regularly due to her mother's health issues and her own work schedule. We discussed the importance of regular meals and adequate protein intake for weight loss and overall health.   She has a history of insulin  resistance, hyperlipidemia, and vitamin D  deficiency. She has been off verapamil  since running out and has not experienced any adverse effects such as increased HA frequency. She continues to take Emgality  injections for migraines and reports no rebound headaches. She has been hydrating well, especially with the current heat, and  has been engaging in some physical activity like mowing the lawn.  Her mother is preparing for kyphoplasty due to a compression fracture on L5, which has impacted her ability to go to the gym as she has been caring for her mother. She is also dealing with stress related to her disability re-evaluation process, which has been delayed since April.  Fasting labs to be obtained later this week as patient forgot to fast. Orders submitted.  The patient was informed we would discuss the lab results at the next visit unless there is a critical issue that needs to be addressed sooner. The patient agreed to keep the next visit at the agreed upon time to discuss these results.   OBJECTIVE: Visit Diagnoses: Problem List Items Addressed This Visit     Morbid obesity (HCC)   Insulin  resistance   Relevant Orders   CMP14+EGFR   Hemoglobin A1c   Insulin , random   Other hyperlipidemia   Relevant Orders   Lipid Panel With LDL/HDL Ratio   Vitamin D  deficiency   Relevant Orders   VITAMIN D  25 Hydroxy (Vit-D Deficiency, Fractures)   Other fatigue   Relevant Orders   Vitamin B12   Other Visit Diagnoses       History of concussion    -  Primary     BMI 38.0-38.9,adult Current BMI 38.1         Concussion Sustained a concussion on June 14th after head trauma above the left temple. Reports ongoing symptoms including memory issues, difficulty focusing, and changes in taste. A CT scan was normal. Vestibular therapy  has been initiated, and speech therapy is scheduled to start on August 19th. Continuing therapy is crucial to address symptoms. - Continue vestibular therapy at [facility]. - Begin speech therapy on August 19th as directed.  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 Reports mood down, but denies SI/HI.  Referred to a therapist but faces financial and insurance-related barriers. Needs to contact the insurance company to  clarify therapy costs and coverage.She has been working with Dr. Sharron on EEB and has follow up scheduled.  -Continue to follow up with Dr. Sharron for emotional eating.  - Contact insurance company to clarify therapy costs and coverage. - Follow up with the therapist at [facility] on [facility].  Insulin  Resistance Last fasting insulin  was 7.3- near goal. A1c was 5.3- at goal. Polyphagia:No Reports decreased hunger and appetite overall.   Medication(s): None Lab Results  Component Value Date   HGBA1C 5.3 03/06/2023   HGBA1C 5.3 03/14/2022   HGBA1C 5.2 07/06/2021   HGBA1C 5.1 02/15/2021   HGBA1C 5.1 12/29/2016   Lab Results  Component Value Date   INSULIN  7.3 03/06/2023   INSULIN  7.0 03/14/2022   INSULIN  6.0 07/06/2021   INSULIN  9.3 02/15/2021    Plan:  Continue working on nutrition plan to decrease simple carbohydrates, increase lean proteins and exercise to promote weight loss, improve glycemic control and prevent progression to Type 2 diabetes.  Recheck fasting labs later this week and discuss at next visit.   Hyperlipidemia LDL is not at goal. Medication(s): No statin therapy Cardiovascular risk factors: dyslipidemia, hypertension, obesity (BMI >= 30 kg/m2), and sedentary lifestyle  Lab Results  Component Value Date   CHOL 221 (H) 03/06/2023   HDL 60 03/06/2023   LDLCALC 143 (H) 03/06/2023   TRIG 99 03/06/2023   CHOLHDL 3.4 07/06/2021   CHOLHDL 3.7 12/29/2016   CHOLHDL 3.4 07/19/2016   Lab Results  Component Value Date   ALT 12 03/06/2023   AST 17 03/06/2023   ALKPHOS 80 03/06/2023   BILITOT 0.4 03/06/2023   The 10-year ASCVD risk score (Arnett DK, et al., 2019) is: 1.7%   Values used to calculate the score:     Age: 53 years     Clincally relevant sex: Female     Is Non-Hispanic African American: No     Diabetic: No     Tobacco smoker: No     Systolic Blood Pressure: 111 mmHg     Is BP treated: Yes     HDL Cholesterol: 60 mg/dL      Total Cholesterol: 221 mg/dL  Plan: Continue to work on Engineer, technical sales -decreasing simple carbohydrates, increasing lean proteins, decreasing saturated fats and cholesterol , avoiding trans fats and exercise as able to promote weight loss, improve lipids and decrease cardiovascular risks. Recheck fasting lipids  Vitamin D  Deficiency Vitamin D  is at goal of 50.  Most recent vitamin D  level was 53.2. She is on OTC vitamin D3 5000 IU daily. No N/V or muscle weakness. Endorses fatigue Lab Results  Component Value Date   VD25OH 53.2 03/06/2023   VD25OH 66.8 12/20/2021   VD25OH 74.2 07/06/2021    Plan: Continue OTC vitamin D3 5000 IU daily Recheck vitamin D  level with other labs this week.  Low vitamin D  levels can be associated with adiposity and may result in leptin resistance and weight gain. Also associated with fatigue.  Currently on vitamin D  supplementation without any adverse effects such as  nausea, vomiting or muscle weakness.    Obesity Experiencing weight gain despite decreased appetite and reduced food intake. The body may store energy as adipose tissue when not receiving adequate nutrition. Not consuming enough protein and advised to increase protein intake to manage weight and improve metabolism. Eating protein at each meal is important to maintain metabolism and prevent weight gain.We discussed the importance of regular meals and adequate protein intake for weight loss and overall health.  - Increase protein intake, aiming for at least four ounces of [place] or ham at lunch and a total of 85 grams daily. - Use a food scale to measure protein portions. - Incorporate protein-rich foods such as eggs, cheese, tuna, and beans into meals. - Consider using reduced-fat or alternative mayonnaise options like avocado or olive oil mayonnaise. - Encourage eating protein at each meal to maintain metabolism and prevent weight gain.   Fatigue Endorses fatigue Recheck labs this week .    General Health Maintenance Advised to maintain hydration, especially in hot weather, and incorporate exercise as tolerated. Encouraged to consume a balanced diet with adequate protein and monitor weight using a scale. Regular exercise benefits energy levels, sleep, and metabolism. - Stay hydrated, especially during hot weather and physical activity. - Incorporate regular exercise, such as going to the gym, as tolerated after her mother's surgery. - Monitor weight using a scale. - Consume a balanced diet with adequate protein and nutrients.  Follow-up Scheduled for a follow-up appointment to review lab results and overall progress. Advised to fast before the lab draw for accurate results. Will be contacted if lab results indicate any significant issues. - Schedule follow-up appointment on August 27th at 12:30 PM. - Ensure fasting before lab draw for accurate results. - Contact if lab results indicate any significant issues.  Vitals Temp: 98 F (36.7 C) BP: (!) 111/54 Pulse Rate: 66 SpO2: 98 %   Anthropometric Measurements Height: 5' 9 (1.753 m) Weight: 258 lb (117 kg) BMI (Calculated): 38.08 Weight at Last Visit: 252 lb Weight Lost Since Last Visit: 0 Weight Gained Since Last Visit: 6 lb Starting Weight: 270 lb Total Weight Loss (lbs): 12 lb (5.443 kg)   Body Composition  Body Fat %: 50.7 % Fat Mass (lbs): 131 lbs Muscle Mass (lbs): 1208 lbs Total Body Water (lbs): 93.8 lbs Visceral Fat Rating : 15   Other Clinical Data Fasting: No Labs: Yes Today's Visit #: 33 Starting Date: 02/15/21     ASSESSMENT AND PLAN:  Diet: Avonna is currently in the action stage of change. As such, her goal is to get back to weight loss efforts. She has agreed to Category 2 Plan and keeping a food journal and adhering to recommended goals of 1300-1400 calories and 85 + grams of protein.  Exercise: Ellorie has been instructed to work up to a goal of 150 minutes of combined cardio and  strengthening exercise per week, to try a geriatric exercise plan, and that some exercise is better than none for weight loss and overall health benefits.   Behavior Modification:  We discussed the following Behavioral Modification Strategies today: increasing lean protein intake, decreasing simple carbohydrates, increasing vegetables, increase H2O intake, increase high fiber foods, no skipping meals, meal planning and cooking strategies, emotional eating strategies , avoiding temptations, and planning for success. We discussed various medication options to help Gurbani with her weight loss efforts and we both agreed to continue current treatment plan, continue to work on nutritional and behavioral strategies to promote weight  loss.  .  Return in about 4 weeks (around 09/24/2023).[name] She was informed of the importance of frequent follow up visits to maximize her success with intensive lifestyle modifications for her multiple health conditions.  Attestation Statements:   Reviewed by clinician on day of visit: allergies, medications, problem list, medical history, surgical history, family history, social history, and previous encounter notes.   Time spent on visit including pre-visit chart review and post-visit care and charting was 37 minutes.    Dewey Viens, PA-C

## 2023-08-27 ENCOUNTER — Ambulatory Visit (INDEPENDENT_AMBULATORY_CARE_PROVIDER_SITE_OTHER): Admitting: Physician Assistant

## 2023-08-27 ENCOUNTER — Encounter: Payer: Self-pay | Admitting: Neurology

## 2023-08-27 ENCOUNTER — Encounter (INDEPENDENT_AMBULATORY_CARE_PROVIDER_SITE_OTHER): Payer: Self-pay | Admitting: Physician Assistant

## 2023-08-27 ENCOUNTER — Ambulatory Visit: Payer: Self-pay | Admitting: Physical Therapy

## 2023-08-27 ENCOUNTER — Telehealth (INDEPENDENT_AMBULATORY_CARE_PROVIDER_SITE_OTHER): Admitting: Psychology

## 2023-08-27 ENCOUNTER — Encounter: Payer: Self-pay | Admitting: Physical Therapy

## 2023-08-27 VITALS — BP 111/54 | HR 66 | Temp 98.0°F | Ht 69.0 in | Wt 258.0 lb

## 2023-08-27 DIAGNOSIS — Z87820 Personal history of traumatic brain injury: Secondary | ICD-10-CM | POA: Diagnosis not present

## 2023-08-27 DIAGNOSIS — R2681 Unsteadiness on feet: Secondary | ICD-10-CM

## 2023-08-27 DIAGNOSIS — F5089 Other specified eating disorder: Secondary | ICD-10-CM

## 2023-08-27 DIAGNOSIS — E88819 Insulin resistance, unspecified: Secondary | ICD-10-CM | POA: Diagnosis not present

## 2023-08-27 DIAGNOSIS — F319 Bipolar disorder, unspecified: Secondary | ICD-10-CM | POA: Diagnosis not present

## 2023-08-27 DIAGNOSIS — Z6838 Body mass index (BMI) 38.0-38.9, adult: Secondary | ICD-10-CM

## 2023-08-27 DIAGNOSIS — E559 Vitamin D deficiency, unspecified: Secondary | ICD-10-CM

## 2023-08-27 DIAGNOSIS — F909 Attention-deficit hyperactivity disorder, unspecified type: Secondary | ICD-10-CM

## 2023-08-27 DIAGNOSIS — R5383 Other fatigue: Secondary | ICD-10-CM | POA: Diagnosis not present

## 2023-08-27 DIAGNOSIS — R42 Dizziness and giddiness: Secondary | ICD-10-CM | POA: Diagnosis not present

## 2023-08-27 DIAGNOSIS — E7849 Other hyperlipidemia: Secondary | ICD-10-CM | POA: Diagnosis not present

## 2023-08-27 NOTE — Therapy (Signed)
 OUTPATIENT PHYSICAL THERAPY VESTIBULAR TREATMENT NOTE     Patient Name: [name] MRN: [id number] DOB:[birth year], 53 y.o., female Today's Date: 08/27/2023  END OF SESSION:  PT End of Session - 08/27/23 2004     Visit Number 5    Number of Visits 9    Date for PT Re-Evaluation 09/07/23   extended 1 week due to schedule availability   Authorization Type HTA    Authorization Time Period 08-02-23 - 09-30-23    Progress Note Due on Visit 10    PT Start Time 0932    PT Stop Time 1016    PT Time Calculation (min) 44 min    Activity Tolerance Patient tolerated treatment well    Behavior During Therapy [facility] for tasks assessed/performed              Past Medical History:  Diagnosis Date   Achilles tendinitis    ADHD (attention deficit hyperactivity disorder), inattentive type    Diagnosed as an adult after starting college   Allergy     Arthritis    Arthritis    Ataxia    Back pain    Bipolar I disorder 04/18/2014   most recent episode mixed   Borderline personality disorder 10/27/2011   Cataracts, bilateral    Chest pain    Chronic pain disorder    due to several injuries affecting numerous areas of her body throughout the years   Chronic paroxysmal hemicrania, 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   rt chest wall nodule removal   GANGLION CYST EXCISION  01/30/2009   lipoma removal     right bunioectomy     SHOULDER SURGERY  01/13/2011   right, partial tear   tumor resection left thigh     Patient Active Problem List   Diagnosis Date Noted   Impaired functional mobility, balance, gait, and endurance 03/08/2023   Unexplained falls 03/08/2023   BPV (benign positional vertigo), right 03/08/2023   Other Specified Feeding or Eating Disorder, Emotional and Binge Eating Behaviors 01/15/2023   Pain in joint of right knee 01/15/2023   Other fatigue 09/24/2022   BMI 37.0-37.9, adult 03/14/2022   Shoulder subluxation, left, initial encounter 02/01/2022   Environmental allergies 01/31/2022   Class 3 severe obesity with serious comorbidity  and body mass index (BMI) of 40.0 to 44.9 in adult 01/31/2022   Bipolar disorder (HCC) 11/28/2021   Arthritis of first metatarsophalangeal (MTP) joint of right foot 10/24/2021   Migraines 08/18/2021   Cervical radiculopathy 08/16/2021   Lipoma of left forearm 08/16/2021   Carpal tunnel syndrome on left 08/16/2021   Vitamin D  deficiency 07/26/2021   Eating disorder 07/26/2021   Insulin  resistance 07/06/2021   Other hyperlipidemia 07/06/2021   B12 deficiency 07/06/2021   Foot sprain, left, initial encounter 06/01/2021   Mood disorder (HCC) 04/29/2021   Sleep difficulties 04/29/2021   Other constipation 04/29/2021   Essential hypertension 04/29/2021   Sprain of ankle 03/29/2021   Subacromial bursitis of left shoulder joint 03/29/2021   Concussion with no loss of consciousness  02/07/2021   Lumbar radiculopathy 02/07/2021   Shoulder impingement syndrome, left 11/01/2020   Sprain of metacarpophalangeal joint of left thumb 10/07/2020   Posterior tibial tendinitis of right lower extremity 09/13/2020   Labral tear of hip, degenerative 09/13/2020   Attention and concentration deficit 05/07/2020   Morbid obesity (HCC) 05/07/2020   Plantar wart 05/07/2020   Sleep apnea 05/07/2020   Urinary incontinence 05/07/2020   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   Genetic testing 02/27/2018   Family history of genetic disease carrier    Family history of breast cancer    Family history of colon cancer    History of multiple concussions 11/20/2017   Ataxia 11/20/2017   Cervical strain, acute, initial encounter 09/19/2016   Insomnia 02/17/2015   Right shoulder pain 12/31/2014   Episodic cluster headache, not intractable 12/02/2014   Chronic paroxysmal hemicrania, 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   Photophobia of both eyes 05/04/2014   Nausea with vomiting 05/04/2014   Bipolar I disorder, most recent episode mixed (HCC) 04/18/2014   Suicidal ideation 04/12/2014   Injury of right shoulder and upper arm 02/17/2014   Migraine with status migrainosus 01/08/2013   Chronic migraine 05/08/2012   Hypertension    Contact dermatitis 11/27/2011   Generalized anxiety disorder 10/27/2011   ADHD (attention deficit hyperactivity disorder), 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   Borderline personality disorder (HCC) 10/27/2011   Right foot pain 09/28/2011   Loss of transverse plantar arch 09/01/2011   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   Ganglion cyst 09/29/2009   Pes planus 07/01/2008    PCP: None REFERRING PROVIDER: Dohmeier, Dedra, MD  REFERRING DIAG: Z74.09 (ICD-10-CM) - Impaired functional mobility, balance, gait, and endurance R42 (ICD-10-CM) - Vertigo Z79.899 (ICD-10-CM) - Medication management H81.11 (ICD-10-CM)  - BPV (benign positional vertigo), right  THERAPY DIAG:  Dizziness and giddiness  Unsteadiness on feet  ONSET DATE: 07-14-23  Rationale for Evaluation and Treatment: Rehabilitation  SUBJECTIVE:   SUBJECTIVE STATEMENT: Pt reports she had 2 episodes of that drop feeling last week - overall says dizziness is improved but did a lot of resting last week; is tired today because she worked 6 hours yesterday at [employer] Pt accompanied by: self  PERTINENT HISTORY: Bipolar Disorder, ADHD, Achilles Tendinitis, Chronic Pain, Migraines, HIstory of Multiple Concussions, HTN, Insomnia, s/p ACL sx in May 2024 - continues to wear knee brace on RLE  PAIN: No pain reported on 08-16-23 Are you having pain? Yes: NPRS scale: 0/10   Pain location: Lt temple region Pain description: more dull pain now  Aggravating factors: bending over or reaching too far forward on Lt side Relieving factors: Tylenol   PRECAUTIONS: Fall  RED FLAGS:  None   WEIGHT BEARING RESTRICTIONS: No  FALLS: Has patient fallen in last 6 months? No  LIVING ENVIRONMENT: Lives with: lives with their family Lives in: House/apartment Stairs: Yes: External: 3 steps; on right going up Has following equipment at home: None  PLOF: Independent  PATIENT GOALS: improve balance, reduce dizziness  OBJECTIVE:  Note: Objective measures were completed at Evaluation unless otherwise noted.  DIAGNOSTIC FINDINGS: N/A  COGNITION: Overall cognitive status: pt reports more problems with memory and cognition since injury on 07-15-23    POSTURE:  rounded shoulders and forward head  Cervical ROM:  WNL's   STRENGTH: WNL's  LOWER EXTREMITY MMT: WNL's   GAIT: Gait pattern: WFL Distance walked: 75' Assistive device utilized: None Level of assistance: Modified independence Comments:   FUNCTIONAL TESTS:  MCTSIB: Condition 1: Avg of 3 trials: 30 sec, Condition 2: Avg of 3 trials: 30 sec, Condition 3: Avg of 3 trials: 30 sec, Condition  4: Avg of 3 trials: 5.9 sec, and Total Score: 95.9/120    VESTIBULAR ASSESSMENT:  GENERAL OBSERVATION: pt known to this clinic for vestibular rehab due to h/o previous concussions; pt reports she hit her head on wall hook in bathroom at work on 07-14-23 when she returned to upright standing after bending down to pick up tissue off the floor; pt reports she has moderate tenderness in Lt side of head - Lt temple region; pt reports she continued with her work and didn't think much of it; on Sunday returned to work but was having a hard time - left early due to dizziness - went to ED at [facility]    SYMPTOM BEHAVIOR:  Subjective history: see above - pt reports increased dizziness and memory problems since hitting head on 07-14-23  Non-Vestibular symptoms: headaches and migraine symptoms  Type of dizziness: Imbalance (Disequilibrium), Unsteady with head/body turns, and dull pain in Lt side of head  Frequency: daily  Duration: mostly constant - worse with movement  Aggravating factors: Induced by motion: bending down to the ground and activity in general  Relieving factors: lying supine, rest, and slow movements  Progression of symptoms: better - pain has improved since initial accident  OCULOMOTOR EXAM:  Ocular Alignment: normal  Ocular ROM: No Limitations  Spontaneous Nystagmus: absent  Gaze-Induced Nystagmus: absent  Smooth Pursuits: intact  Saccades: intact  VESTIBULAR - OCULAR REFLEX:   Slow VOR: Normal    Dynamic Visual Acuity: Static: line 9 Dynamic: line 7 - c/o moderate dizziness upon completion of test   POSITIONAL TESTING: Other: NT based on diagnosis of post concussion   MOTION SENSITIVITY:  Motion Sensitivity Quotient Intensity: 0 = none, 1 = Lightheaded, 2 = Mild, 3 = Moderate, 4 = Severe, 5 = Vomiting  Intensity  1. Sitting to supine   2. Supine to L side   3. Supine to R side   4. Supine to sitting   5. L Hallpike-Dix   6. Up from L    7. R Hallpike-Dix   8. Up  from R    9. Sitting, head tipped to L knee   10. Head up from L knee   11. Sitting, head tipped to R knee   12. Head up from R knee   13. Sitting head turns x5   14.Sitting head nods x5   15. In stance, 180 turn to L    16. In stance, 180 turn to R  TREATMENT DATE: 08-27-23  NeuroRe-ed:  -Gaze Adaptation:  x1 Viewing Horizontal: Position: standing, Time: 30 secs, Reps: 1, and Comment:   and x1 Viewing Vertical:  Position: standing, Time: 30 secs, Reps: 1, and Comment: target was placed on posture grid for busy background - pt stood 5' away; only slight dizziness reported after each direction  -Standing Balance:  Pt stood on Airex in corner with EC for 10 secs without head movement - then added head turns with targets with EO for improved gaze stabilization Surface: Airex Position: Feet Hip Width Apart Completed with: Eyes Open and Eyes Closed; Head Turns x 5 Reps and Head Nods x 5 Reps  Ambulating approx. 30' reading lines on colored Hart chart held at arm's length - 1 rep  Standing on Airex in corner - performed head turns 5 reps horizontally, then 5 reps vertically with EO; then with EC  Rocker board - 10 reps with EO, then 10 reps with EC with bil. UE support on // bars; pt stood on rockerboard - read called lines/named color of letters on [place] chart on pt's Rt side; then read called lines on [place] chart on pt's Lt side - while standing on rockerboard  TherAct: Patterned cloth placed on floor - 4 cones placed on 1 end - pt amb. To end of cloth, picked up cone, turned 180 degrees and amb. To other end of cloth to place cone on floor - with SBA Pt then amb. In diagonal pattern on cloth X to retrieve cone, turned 180 degrees and amb. In diagonal direction to place cone on other end of cloth - 4 cones used; pt reported increased dizziness with amb. In  diagonal direction on cloth than with amb. In straight path  Pt amb. 30' tracking ball in CW direction x 1 rep, then 30' moving ball in CCW direction with tracking with eyes and head moving for improved balance during gait with head movement    Access Code: [email or web address] URL: [email or web address] Date: 08/03/2023 Prepared by: Rock Kussmaul  Exercises - Standing Gaze Stabilization with Head Rotation  - 1 x daily - 7 x weekly - 1 sets - 1-2 reps  PATIENT EDUCATION: Education details: gaze stabilization exercise - increased time for x1 viewing from 45 secs to 60 secs  Person educated: Patient Education method: Explanation, Demonstration, and Handouts Education comprehension: verbalized understanding and returned demonstration  HOME EXERCISE PROGRAM:  GOALS: Goals reviewed with patient? Yes  SHORT TERM GOALS: same as LTG's as ELOS = 4 weeks   LONG TERM GOALS: Target date: 09-07-23  Pt will subjectively report at least 50% improvement in dizziness. Baseline:  Goal status: INITIAL  2.  Pt will perform x1 viewing exercise for 1 horizontal & vertical head turns with no c/o dizziness to demo improved gaze stabilization. Baseline: 2 line difference with DVA but moderate c/o dizziness upon test completion Goal status: INITIAL  3.  Pt will maintain balance on condition 4  of mCTSIB for 30 secs to demo improved vestibular input in maintaining balance.  Baseline: 5.9 secs (08-02-23) Goal status: INITIAL  4.  Independent in HEP for balance and vestibular exercises. Baseline:  Goal status: INITIAL  5.  Pt will report pain in Lt temple region to </= 2/10 for increased ease with work activities and ADL's. Baseline:  4/10 on 08-02-23 Goal status:  INITIAL  ASSESSMENT:  CLINICAL IMPRESSION: PT session focused on visual/vestibular exercises and balance exercises with increased vestibular input incorporated.  Pt reports > dizziness provoked  with horizontal head turns compared to  that with vertical head turns.  Pt continues to have LOB with standing on compliant surface with EC with head turns - used corner walls to assist with balance recovery.  Pt reported increased dizziness provoked with amb. In diagonal path on patterned cloth on floor compared to amb. In straight path on cloth.  Pt tolerated exercises well in today's session with minimal rest breaks needed due to c/o dizziness.  Cont with POC.  OBJECTIVE IMPAIRMENTS: decreased balance, difficulty walking, and dizziness.   ACTIVITY LIMITATIONS: bending, reach over head, and locomotion level  PARTICIPATION LIMITATIONS: cleaning, shopping, community activity, occupation, and yard work  PERSONAL FACTORS: Behavior pattern, Fitness, Past/current experiences, and 1-2 comorbidities: h/o multiple concussions are also affecting patient's functional outcome.   REHAB POTENTIAL: Good  CLINICAL DECISION MAKING: Evolving/moderate complexity  EVALUATION COMPLEXITY: Moderate   PLAN:  PT FREQUENCY: 1-2x/week  PT DURATION: 4 weeks  PLANNED INTERVENTIONS: 97110-Therapeutic exercises, 97530- Therapeutic activity, [id number]- Neuromuscular re-education, [id number]- Self Care, [id number]- Gait training, Patient/Family education, and Vestibular training  PLAN FOR NEXT SESSION: visual/vestibular exercises with cognitive component added    Shaquana Buel, [place], PT 08/27/2023, 8:08 PM

## 2023-08-27 NOTE — Progress Notes (Signed)
  Office: [phone]  /  Fax: [phone]    Date: August 27, 2023  Appointment Start Time: 3:01pm Duration: 38 minutes Provider: Wyatt Fire, Psy.D. Type of Session: Individual Therapy  Location of Patient: Home (private location) Location of Provider: Provider's Home (private office) Type of Contact: Telepsychological Visit via MyChart Video Visit  Session Content: [name] is a 53 y.o. female presenting for a follow-up appointment to address the previously established treatment goal of increasing coping skills. Today's appointment was a telepsychological visit. Kiran provided verbal consent for today's telepsychological appointment and she is aware she is responsible for securing confidentiality on her end of the session. Prior to proceeding with today's appointment, Sharice's physical location at the time of this appointment was obtained as well a phone number she could be reached at in the event of technical difficulties. Tasnim and this provider participated in today's telepsychological service.   This provider conducted a brief check-in. [name] shared about her appointment with Elouise Phlegm, PA-C today, noting she gained six pounds. She further shared about strategies recommended by PA Montgomery. Of note, [name] described experiencing increased brain fog. It was recommended she f/u with her neurologist. She also provided an update regarding her employment status and other health-related concerns. Moreover, [name] described experiencing frustration as she experiences difficulty remembering things in addition to the brain fog. Psychoeducation provided regarding the mind-body connection, including the impact on eating habits. [name] reported a plan to practice guided imagery on a daily basis to assist with the abovementioned. Overall, [name] was receptive to today's appointment as evidenced by openness to sharing, responsiveness to feedback, and willingness to continue engaging in learned  skills.  Mental Status Examination:  Appearance: neat Behavior: appropriate to circumstances Mood: sad Affect: mood congruent Speech: WNL Eye Contact: appropriate Psychomotor Activity: WNL Gait: unable to assess Thought Process: linear, logical, and goal directed and denies suicidal, homicidal, and self-harm ideation, plan and intent since the last appointment with this provider Thought Content/Perception: no hallucinations, delusions, bizarre thinking or behavior endorsed or observed Orientation: AAOx4 Memory/Concentration: intact Insight: fair Judgment: fair  Interventions:  Conducted a brief chart review Conducted a risk assessment Provided empathic reflections and validation Reviewed content from the previous session Provided positive reinforcement Employed supportive psychotherapy interventions to facilitate reduced distress and to improve coping skills with identified stressors Psychoeducation provided regarding mind-body connection   DSM-5 Diagnosis(es): F50.89 Other Specified Feeding or Eating Disorder, Emotional and Binge Eating Behaviors, F90.9 Unspecified Attention-Deficit/Hyperactivity Disorder , and F31.9 Unspecified Bipolar and Related Disorder  Treatment Goal & Progress: Klaira re-established care with this provider and the following treatment goal was established: increase coping skills. [name] has demonstrated progress in her goal as evidenced by her self-report of reducing soda intake, willingness to implement mindfulness-based strategies, and willingness to implement other learned strategies (e.g., urge surfing, guided imagery).   Plan: The next appointment is scheduled for 09/11/2023 at 11:30am, which will be via MyChart Video Visit. The next session will focus on working towards the established treatment goal. [name] forgot to contact her insurance, but agreed to call today to determine if she can establish care with [facility].   Wyatt Fire,  PsyD

## 2023-08-27 NOTE — Addendum Note (Signed)
 Addended by: SHARRON WYATT SQUIBB on: 08/27/2023 05:07 PM   Modules accepted: Level of Service

## 2023-08-29 DIAGNOSIS — F603 Borderline personality disorder: Secondary | ICD-10-CM | POA: Diagnosis not present

## 2023-08-29 DIAGNOSIS — F900 Attention-deficit hyperactivity disorder, predominantly inattentive type: Secondary | ICD-10-CM | POA: Diagnosis not present

## 2023-08-29 DIAGNOSIS — F3181 Bipolar II disorder: Secondary | ICD-10-CM | POA: Diagnosis not present

## 2023-08-30 ENCOUNTER — Ambulatory Visit: Payer: Self-pay | Admitting: Physical Therapy

## 2023-08-30 DIAGNOSIS — R42 Dizziness and giddiness: Secondary | ICD-10-CM

## 2023-08-30 DIAGNOSIS — R2681 Unsteadiness on feet: Secondary | ICD-10-CM

## 2023-08-30 NOTE — Therapy (Signed)
 OUTPATIENT PHYSICAL THERAPY VESTIBULAR TREATMENT NOTE     Patient Name: [name] MRN: [id number] DOB:1970-08-01, 53 y.o., female Today's Date: 08/31/2023  END OF SESSION:  PT End of Session - 09/02/23 1437     Visit Number 6    Number of Visits 9    Date for PT Re-Evaluation 09/07/23   extended 1 week due to schedule availability   Authorization Type HTA    Authorization Time Period 08-02-23 - 09-30-23    Progress Note Due on Visit 10    PT Start Time 1402    PT Stop Time 1445    PT Time Calculation (min) 43 min    Activity Tolerance Patient tolerated treatment well    Behavior During Therapy [facility] for tasks assessed/performed               Past Medical History:  Diagnosis Date   Achilles tendinitis    ADHD (attention deficit hyperactivity disorder), inattentive type    Diagnosed as an adult after starting college   Allergy     Arthritis    Arthritis    Ataxia    Back pain    Bipolar I disorder 04/18/2014   most recent episode mixed   Borderline personality disorder 10/27/2011   Cataracts, bilateral    Chest pain    Chronic pain disorder    due to several injuries affecting numerous areas of her body throughout the years   Chronic paroxysmal hemicrania, 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   rt chest wall nodule removal   GANGLION CYST EXCISION  01/30/2009   lipoma removal     right bunioectomy     SHOULDER SURGERY  01/13/2011   right, partial tear   tumor resection left thigh     Patient Active Problem List   Diagnosis Date Noted   Impaired functional mobility, balance, gait, and endurance 03/08/2023   Unexplained falls 03/08/2023   BPV (benign positional vertigo), right 03/08/2023   Other Specified Feeding or Eating Disorder, Emotional and Binge Eating Behaviors 01/15/2023   Pain in joint of right knee 01/15/2023   Other fatigue 09/24/2022   BMI 37.0-37.9, adult 03/14/2022   Shoulder subluxation, left, initial encounter 02/01/2022   Environmental allergies 01/31/2022   Class 3 severe obesity with serious  comorbidity and body mass index (BMI) of 40.0 to 44.9 in adult 01/31/2022   Bipolar disorder (HCC) 11/28/2021   Arthritis of first metatarsophalangeal (MTP) joint of right foot 10/24/2021   Migraines 08/18/2021   Cervical radiculopathy 08/16/2021   Lipoma of left forearm 08/16/2021   Carpal tunnel syndrome on left 08/16/2021   Vitamin D  deficiency 07/26/2021   Eating disorder 07/26/2021   Insulin  resistance 07/06/2021   Other hyperlipidemia 07/06/2021   B12 deficiency 07/06/2021   Foot sprain, left, initial encounter 06/01/2021   Mood disorder (HCC) 04/29/2021   Sleep difficulties 04/29/2021   Other constipation 04/29/2021   Essential hypertension 04/29/2021   Sprain of ankle 03/29/2021   Subacromial bursitis of left shoulder joint 03/29/2021   Concussion with no loss of consciousness  02/07/2021   Lumbar radiculopathy 02/07/2021   Shoulder impingement syndrome, left 11/01/2020   Sprain of metacarpophalangeal joint of left thumb 10/07/2020   Posterior tibial tendinitis of right lower extremity 09/13/2020   Labral tear of hip, degenerative 09/13/2020   Attention and concentration deficit 05/07/2020   Morbid obesity (HCC) 05/07/2020   Plantar wart 05/07/2020   Sleep apnea 05/07/2020   Urinary incontinence 05/07/2020   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   Genetic testing 02/27/2018   Family history of genetic disease carrier    Family history of breast cancer    Family history of colon cancer    History of multiple concussions 11/20/2017   Ataxia 11/20/2017   Cervical strain, acute, initial encounter 09/19/2016   Insomnia 02/17/2015   Right shoulder pain 12/31/2014   Episodic cluster headache, not intractable 12/02/2014   Chronic paroxysmal hemicrania, 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   Photophobia of both eyes 05/04/2014   Nausea with vomiting 05/04/2014   Bipolar I disorder, most recent episode mixed (HCC) 04/18/2014   Suicidal ideation 04/12/2014   Injury of right shoulder and upper arm 02/17/2014   Migraine with status migrainosus 01/08/2013   Chronic migraine 05/08/2012   Hypertension    Contact dermatitis 11/27/2011   Generalized anxiety disorder 10/27/2011   ADHD (attention deficit hyperactivity disorder), 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   Borderline personality disorder (HCC) 10/27/2011   Right foot pain 09/28/2011   Loss of transverse plantar arch 09/01/2011   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   Ganglion cyst 09/29/2009   Pes planus 07/01/2008    PCP: None REFERRING PROVIDER: Dohmeier, Dedra, MD  REFERRING DIAG: Z74.09 (ICD-10-CM) - Impaired functional mobility, balance, gait, and endurance R42 (ICD-10-CM) - Vertigo Z79.899 (ICD-10-CM) - Medication management H81.11 (ICD-10-CM)  - BPV (benign positional vertigo), right  THERAPY DIAG:  Unsteadiness on feet  Dizziness and giddiness  ONSET DATE: 07-14-23  Rationale for Evaluation and Treatment: Rehabilitation  SUBJECTIVE:   SUBJECTIVE STATEMENT: Pt reports no episodes of drop feeling since Monday this week; received message back from [facility] - was instructed to keep her appt on 09-10-23, pt states she can't really be placed on cancellation list because she may not be able to take the appt (if offerred) due to her mother's surgery scheduled on 09-05-23 and due to her work schedule next week Pt accompanied by: self  PERTINENT HISTORY: Bipolar Disorder, ADHD, Achilles Tendinitis, Chronic Pain, Migraines, HIstory of Multiple Concussions, HTN, Insomnia, s/p ACL sx in May 2024 - continues to wear knee brace on RLE  PAIN: No pain reported on 08-16-23 Are you having pain? Yes: NPRS scale: 0/10   Pain location: Lt temple region Pain  description: more dull pain now  Aggravating factors: bending over or reaching too far forward on Lt side Relieving factors: Tylenol   PRECAUTIONS: Fall  RED FLAGS: None   WEIGHT BEARING RESTRICTIONS: No  FALLS: Has patient fallen in last 6 months? No  LIVING ENVIRONMENT: Lives with: lives with their family Lives in: House/apartment Stairs: Yes: External: 3 steps; on right going up Has following equipment at home: None  PLOF: Independent  PATIENT GOALS: improve balance, reduce dizziness  OBJECTIVE:  Note: Objective measures were completed at Evaluation unless otherwise noted.  DIAGNOSTIC FINDINGS: N/A  COGNITION: Overall cognitive status: pt reports more problems with memory and cognition since injury on 07-15-23    POSTURE:  rounded shoulders and forward head  Cervical ROM:  WNL's   STRENGTH: WNL's  LOWER EXTREMITY MMT: WNL's   GAIT: Gait pattern: WFL Distance walked: 47' Assistive device utilized: None Level of assistance: Modified independence Comments:    FUNCTIONAL TESTS:  MCTSIB: Condition 1: Avg of 3 trials: 30 sec, Condition 2: Avg of 3 trials: 30 sec, Condition 3: Avg of 3 trials: 30 sec, Condition 4: Avg of 3 trials: 5.9 sec, and Total Score: 95.9/120    VESTIBULAR ASSESSMENT:  GENERAL OBSERVATION: pt known to this clinic for vestibular rehab due to h/o previous concussions; pt reports she hit her head on wall hook in bathroom at work on 07-14-23 when she returned to upright standing after bending down to pick up tissue off the floor; pt reports she has moderate tenderness in Lt side of head - Lt temple region; pt reports she continued with her work and didn't think much of it; on Sunday returned to work but was having a hard time - left early due to dizziness - went to ED at [facility]    SYMPTOM BEHAVIOR:  Subjective history: see above - pt reports increased dizziness and memory problems since hitting head on 07-14-23  Non-Vestibular symptoms: headaches and migraine symptoms  Type of dizziness: Imbalance (Disequilibrium), Unsteady with head/body turns, and dull pain in Lt side of head  Frequency: daily  Duration: mostly constant - worse with movement  Aggravating factors: Induced by motion: bending down to the ground and activity in general  Relieving factors: lying supine, rest, and slow movements  Progression of symptoms: better - pain has improved since initial accident  OCULOMOTOR EXAM:  Ocular Alignment: normal  Ocular ROM: No Limitations  Spontaneous Nystagmus: absent  Gaze-Induced Nystagmus: absent  Smooth Pursuits: intact  Saccades: intact  VESTIBULAR - OCULAR REFLEX:   Slow VOR: Normal    Dynamic Visual Acuity: Static: line 9 Dynamic: line 7 - c/o moderate dizziness upon completion of test   POSITIONAL TESTING: Other: NT based on diagnosis of post concussion   MOTION SENSITIVITY:  Motion Sensitivity Quotient Intensity: 0 = none, 1 = Lightheaded, 2 = Mild, 3 = Moderate, 4 = Severe, 5 = Vomiting  Intensity  1.  Sitting to supine   2. Supine to L side   3. Supine to R side   4. Supine to sitting   5. L Hallpike-Dix   6. Up from L    7. R Hallpike-Dix   8. Up from R    9. Sitting, head tipped to L knee   10. Head up from L knee   11. Sitting, head tipped to R knee   12. Head up from R knee   13. Sitting head turns x5   14.Sitting head nods x5   15. In stance,  180 turn to L    16. In stance, 180 turn to R                                                                                                                                 TREATMENT DATE: 08-30-23  NeuroRe-ed:  -Gaze Adaptation:  x1 Viewing Horizontal: Position: standing, Time: 60 secs, Reps: 1, and Comment:   and x1 Viewing Vertical:  Position: standing, Time: 60 secs, Reps: 1, and Comment: target was placed on posture grid for busy background - pt stood 5' away; slight dizziness reported after each direction  -Standing Balance:  Pt stood on Airex in corner with EC for 10 secs without head movement - then added head turns with targets with EO for improved gaze stabilization Surface: Airex Position: Feet Hip Width Apart Completed with: Eyes Open and Eyes Closed; Head Turns x 5 Reps and Head Nods x 5 Reps  Ambulating approx. 30' reading lines on colored Hart chart held at arm's length - 1 rep  Standing on Airex in corner - performed head turns 5 reps horizontally, then 5 reps vertically with EO; then with EC Pt read called lines on colored Shiela chart - standing on Airex- with feet shoulder width apart - moved card in various directions including CW direction, horizontal, vertical and diagonal directions  Rocker board - 10 reps with EO, then 10 reps with EC with bil. UE support on // bars  Pt stood with feet on Airex - performed sit to stand with EC - without UE support - 3 reps only due to c/o Rt knee pain with this activity so it was discontinued after 3rd rep - activity performed for increased vestibular input in maintaining  balance  Pt amb. 30' tracking ball in CW direction x 1 rep, then 30' moving ball in CCW direction with tracking with eyes and head moving for improved balance during gait with head movement    Access Code: [email or web address] URL: [email or web address] Date: 08/03/2023 Prepared by: Rock Kussmaul  Exercises - Standing Gaze Stabilization with Head Rotation  - 1 x daily - 7 x weekly - 1 sets - 1-2 reps  PATIENT EDUCATION: Education details: gaze stabilization exercise - increased time for x1 viewing from 45 secs to 60 secs  Person educated: Patient Education method: Explanation, Demonstration, and Handouts Education comprehension: verbalized understanding and returned demonstration  HOME EXERCISE PROGRAM:  GOALS: Goals reviewed with patient? Yes  SHORT TERM GOALS: same as LTG's as ELOS = 4 weeks   LONG TERM GOALS: Target date: 09-07-23  Pt will subjectively report at least 50% improvement in dizziness. Baseline:  Goal status: INITIAL  2.  Pt will perform x1 viewing exercise for 1 horizontal & vertical head turns with no c/o dizziness to demo improved gaze stabilization. Baseline: 2 line difference with DVA but moderate c/o dizziness upon test completion Goal status: INITIAL  3.  Pt will maintain balance on condition  4  of mCTSIB for 30 secs to demo improved vestibular input in maintaining balance.  Baseline: 5.9 secs (08-02-23) Goal status: INITIAL  4.  Independent in HEP for balance and vestibular exercises. Baseline:  Goal status: INITIAL  5.  Pt will report pain in Lt temple region to </= 2/10 for increased ease with work activities and ADL's. Baseline:  4/10 on 08-02-23 Goal status:  INITIAL  ASSESSMENT:  CLINICAL IMPRESSION: PT session focused on visual/vestibular exercises and balance exercises on compliant surface for increased vestibular input.  Pt reported only mild increase in dizziness upon completion of x1 viewing with target on patterned background.  Pt  tolerated standing on Airex with head turns and with reading lines on [place] chart well with minimal c/o dizziness.  Pt did have some postural instability resulting in posterior LOB with need to touch wall for assist with balance recovery.  Pt is progressing well towards goals.  Cont with POC.  OBJECTIVE IMPAIRMENTS: decreased balance, difficulty walking, and dizziness.   ACTIVITY LIMITATIONS: bending, reach over head, and locomotion level  PARTICIPATION LIMITATIONS: cleaning, shopping, community activity, occupation, and yard work  PERSONAL FACTORS: Behavior pattern, Fitness, Past/current experiences, and 1-2 comorbidities: h/o multiple concussions are also affecting patient's functional outcome.   REHAB POTENTIAL: Good  CLINICAL DECISION MAKING: Evolving/moderate complexity  EVALUATION COMPLEXITY: Moderate   PLAN:  PT FREQUENCY: 1-2x/week  PT DURATION: 4 weeks  PLANNED INTERVENTIONS: 97110-Therapeutic exercises, 97530- Therapeutic activity, [id number]- Neuromuscular re-education, [phone]- Self Care, [id number]- Gait training, Patient/Family education, and Vestibular training  PLAN FOR NEXT SESSION: visual/vestibular exercises with cognitive component added    Doretta Remmert, [place], PT 08/31/2023, 2:38 PM

## 2023-09-02 ENCOUNTER — Encounter: Payer: Self-pay | Admitting: Physical Therapy

## 2023-09-03 ENCOUNTER — Ambulatory Visit: Payer: Self-pay | Attending: Neurology | Admitting: Physical Therapy

## 2023-09-03 DIAGNOSIS — R41841 Cognitive communication deficit: Secondary | ICD-10-CM | POA: Insufficient documentation

## 2023-09-03 DIAGNOSIS — R42 Dizziness and giddiness: Secondary | ICD-10-CM | POA: Insufficient documentation

## 2023-09-03 DIAGNOSIS — R2681 Unsteadiness on feet: Secondary | ICD-10-CM | POA: Insufficient documentation

## 2023-09-06 ENCOUNTER — Encounter: Payer: Self-pay | Admitting: Physical Therapy

## 2023-09-10 ENCOUNTER — Ambulatory Visit: Payer: PPO | Admitting: Adult Health

## 2023-09-11 ENCOUNTER — Telehealth (INDEPENDENT_AMBULATORY_CARE_PROVIDER_SITE_OTHER): Admitting: Psychology

## 2023-09-11 ENCOUNTER — Encounter: Admitting: Speech Pathology

## 2023-09-11 ENCOUNTER — Ambulatory Visit: Payer: Self-pay | Admitting: Physical Therapy

## 2023-09-11 DIAGNOSIS — F909 Attention-deficit hyperactivity disorder, unspecified type: Secondary | ICD-10-CM

## 2023-09-11 DIAGNOSIS — F5089 Other specified eating disorder: Secondary | ICD-10-CM | POA: Diagnosis not present

## 2023-09-11 DIAGNOSIS — R42 Dizziness and giddiness: Secondary | ICD-10-CM | POA: Diagnosis not present

## 2023-09-11 DIAGNOSIS — R41841 Cognitive communication deficit: Secondary | ICD-10-CM | POA: Diagnosis not present

## 2023-09-11 DIAGNOSIS — F319 Bipolar disorder, unspecified: Secondary | ICD-10-CM | POA: Diagnosis not present

## 2023-09-11 DIAGNOSIS — R2681 Unsteadiness on feet: Secondary | ICD-10-CM | POA: Diagnosis not present

## 2023-09-11 NOTE — Progress Notes (Signed)
  Office: [phone]  /  Fax: [phone]    Date: September 11, 2023  Appointment Start Time: 11:36am Duration: 32 minutes Provider: Wyatt Fire, Psy.D. Type of Session: Individual Therapy  Location of Patient: Home (private location) Location of Provider: Provider's Home (private office) Type of Contact: Telepsychological Visit via MyChart Video Visit  Session Content: [name] is a 53 y.o. female presenting for a follow-up appointment to address the previously established treatment goal of increasing coping skills. Today's appointment was a telepsychological visit. [name] provided verbal consent for today's telepsychological appointment and she is aware she is responsible for securing confidentiality on her end of the session. Prior to proceeding with today's appointment, [name]'s physical location at the time of this appointment was obtained as well a phone number she could be reached at in the event of technical difficulties. [name] and this provider participated in today's telepsychological service.   This provider conducted a brief check-in. [name] shared she completed vestibular therapy, noting there are some exercises she needs to continue engaging in at home. Thoughts and feelings were associated with her initiating therapeutic services with [facility]. Regarding her eating habits, [name] indicated an increase in protein intake, noting she sometimes has to make herself eat due to decreased appetite secondary to depressed mood and physical concerns. She was encouraged to discuss the aforementioned with her primary therapist further tomorrow. She was also encouraged to try eating smaller/frequent meals. Overall, [name] was receptive to today's appointment as evidenced by openness to sharing, responsiveness to feedback, and willingness to continue engaging in learned skills.  Mental Status Examination:  Appearance: neat Behavior: appropriate to circumstances Mood:  depressed Affect: mood congruent Speech: WNL Eye Contact: appropriate Psychomotor Activity: WNL Gait: unable to assess Thought Process: linear, logical, and goal directed and denies suicidal, homicidal, and self-harm ideation, plan and intent since the last appointment with this provider Thought Content/Perception: no hallucinations, delusions, bizarre thinking or behavior endorsed or observed Orientation: AAOx4 Memory/Concentration: intact Insight: fair Judgment: fair  Interventions:  Conducted a brief chart review Conducted a risk assessment Provided empathic reflections and validation Reviewed content from the previous session Provided positive reinforcement Employed supportive psychotherapy interventions to facilitate reduced distress and to improve coping skills with identified stressors Engaged patient in problem solving  DSM-5 Diagnosis(es): F50.89 Other Specified Feeding or Eating Disorder, Emotional and Binge Eating Behaviors, F90.9 Unspecified Attention-Deficit/Hyperactivity Disorder , and F31.9 Unspecified Bipolar and Related Disorder  Treatment Goal & Progress: [name] re-established care with this provider and the following treatment goal was established: increase coping skills. [name] has demonstrated progress in her goal as evidenced by her self-report of reducing soda intake, willingness to implement mindfulness-based strategies, and willingness to implement other learned strategies (e.g., urge surfing, guided imagery).   Plan: The next appointment is scheduled for 09/25/2023 at 10am, which will be via MyChart Video Visit. The next session will focus on working towards the established treatment goal. [name] will be initiating therapeutic services with [name], MSW, LCSW with [facility] tomorrow.    Wyatt Fire, PsyD

## 2023-09-11 NOTE — Therapy (Signed)
 OUTPATIENT PHYSICAL THERAPY VESTIBULAR TREATMENT NOTE/DISCHARGE SUMMARY     Patient Name: [name] MRN: [id number] DOB:10-18-1970, 53 y.o., female Today's Date: 08/31/2023  END OF SESSION:  PT End of Session - 09/13/23 0914     Visit Number 7    Number of Visits 9    Date for PT Re-Evaluation 09/07/23   extended 1 week due to schedule availability   Authorization Type HTA    Authorization Time Period 08-02-23 - 09-30-23    Progress Note Due on Visit 10    PT Start Time 0936    PT Stop Time 1016    PT Time Calculation (min) 40 min    Activity Tolerance Patient tolerated treatment well    Behavior During Therapy 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   rt chest wall nodule removal   GANGLION CYST EXCISION  01/30/2009   lipoma removal     right bunioectomy     SHOULDER SURGERY  01/13/2011   right, partial tear   tumor resection left thigh     Patient Active Problem List   Diagnosis Date Noted   Impaired functional mobility, balance, gait, and endurance 03/08/2023   Unexplained falls 03/08/2023   BPV (benign positional vertigo), right 03/08/2023   Other Specified Feeding or Eating Disorder, Emotional and Binge Eating Behaviors 01/15/2023   Pain in joint of right knee 01/15/2023   Other fatigue 09/24/2022   BMI 37.0-37.9, adult 03/14/2022   Shoulder subluxation, left, initial encounter 02/01/2022   Environmental allergies 01/31/2022   Class 3 severe obesity  with serious comorbidity and body mass index (BMI) of 40.0 to 44.9 in adult 01/31/2022   Bipolar disorder (HCC) 11/28/2021   Arthritis of first metatarsophalangeal (MTP) joint of right foot 10/24/2021   Migraines 08/18/2021   Cervical radiculopathy 08/16/2021   Lipoma of left forearm 08/16/2021   Carpal tunnel syndrome on left 08/16/2021   Vitamin D  deficiency 07/26/2021   Eating disorder 07/26/2021   Insulin  resistance 07/06/2021   Other hyperlipidemia 07/06/2021   B12 deficiency 07/06/2021   Foot sprain, left, initial encounter 06/01/2021   Mood disorder (HCC) 04/29/2021   Sleep difficulties 04/29/2021   Other constipation 04/29/2021   Essential hypertension 04/29/2021   Sprain of ankle 03/29/2021   Subacromial bursitis of left shoulder joint 03/29/2021   Concussion with no  loss of consciousness 02/07/2021   Lumbar radiculopathy 02/07/2021   Shoulder impingement syndrome, left 11/01/2020   Sprain of metacarpophalangeal joint of left thumb 10/07/2020   Posterior tibial tendinitis of right lower extremity 09/13/2020   Labral tear of hip, degenerative 09/13/2020   Attention and concentration deficit 05/07/2020   Morbid obesity (HCC) 05/07/2020   Plantar wart 05/07/2020   Sleep apnea 05/07/2020   Urinary incontinence 05/07/2020   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   Genetic testing 02/27/2018   Family history of genetic disease carrier    Family history of breast cancer    Family history of colon cancer    History of multiple concussions 11/20/2017   Ataxia 11/20/2017   Cervical strain, acute, initial encounter 09/19/2016   Insomnia 02/17/2015   Right shoulder pain 12/31/2014   Episodic cluster headache, not intractable 12/02/2014   Chronic paroxysmal hemicrania, 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   Photophobia of both eyes 05/04/2014   Nausea with vomiting 05/04/2014   Bipolar I disorder, most recent episode mixed (HCC) 04/18/2014   Suicidal ideation 04/12/2014   Injury of right shoulder and upper arm 02/17/2014   Migraine with status migrainosus 01/08/2013   Chronic migraine 05/08/2012   Hypertension    Contact dermatitis 11/27/2011   Generalized anxiety disorder 10/27/2011   ADHD (attention deficit hyperactivity disorder), 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   Borderline personality disorder (HCC) 10/27/2011   Right foot pain 09/28/2011   Loss of transverse plantar arch 09/01/2011   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   Ganglion cyst 09/29/2009   Pes planus 07/01/2008    PCP: None REFERRING PROVIDER: Dohmeier, Dedra, MD  REFERRING DIAG: Z74.09 (ICD-10-CM) - Impaired functional mobility, balance, gait, and endurance R42 (ICD-10-CM) - Vertigo Z79.899 (ICD-10-CM) - Medication  management H81.11 (ICD-10-CM) - BPV (benign positional vertigo), right  THERAPY DIAG:  Unsteadiness on feet  Dizziness and giddiness  ONSET DATE: 07-14-23  Rationale for Evaluation and Treatment: Rehabilitation  SUBJECTIVE:   SUBJECTIVE STATEMENT: Pt reports she had 2 episodes of elevator drop since previous PT session 2 weeks ago: 1 occurred last week and 1 on Sunday - 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 Chalice- scheduled to start ST next week Pt accompanied by: self  PERTINENT HISTORY: Bipolar Disorder, ADHD, Achilles Tendinitis, Chronic Pain, Migraines, HIstory of Multiple Concussions, HTN, Insomnia, s/p ACL sx in May 2024 - continues to wear knee brace on RLE  PAIN: No pain reported on 09-11-23 Are you having pain? Yes: NPRS scale: 0/10   Pain location: Lt temple  region Pain description: more dull pain now  Aggravating factors: bending over or reaching too far forward on Lt side Relieving factors: Tylenol   PRECAUTIONS: Fall  RED FLAGS: None   WEIGHT BEARING RESTRICTIONS: No  FALLS: Has patient fallen in last 6 months? No  LIVING ENVIRONMENT: Lives with: lives with their family Lives in: House/apartment Stairs: Yes: External: 3 steps; on right going up Has following equipment at home: None  PLOF: Independent  PATIENT GOALS: improve balance, reduce dizziness  OBJECTIVE:  Note: Objective measures were completed at Evaluation unless otherwise noted.  DIAGNOSTIC FINDINGS: N/A  COGNITION: Overall cognitive status: pt reports more problems with memory and cognition since injury on 07-15-23    POSTURE:  rounded shoulders and forward head  Cervical ROM:  WNL's   STRENGTH: WNL's  LOWER EXTREMITY MMT: WNL's   GAIT: Gait pattern: WFL Distance walked: 2' Assistive device utilized: None Level of assistance: Modified  independence Comments:   FUNCTIONAL TESTS:  MCTSIB: Condition 1: Avg of 3 trials: 30 sec, Condition 2: Avg of 3 trials: 30 sec, Condition 3: Avg of 3 trials: 30 sec, Condition 4: Avg of 3 trials: 5.9 sec, and Total Score: 95.9/120    VESTIBULAR ASSESSMENT:  GENERAL OBSERVATION: pt known to this clinic for vestibular rehab due to h/o previous concussions; pt reports she hit her head on wall hook in bathroom at work on 07-14-23 when she returned to upright standing after bending down to pick up tissue off the floor; pt reports she has moderate tenderness in Lt side of head - Lt temple region; pt reports she continued with her work and didn't think much of it; on Sunday returned to work but was having a hard time - left early due to dizziness - went to ED at [facility]    SYMPTOM BEHAVIOR:  Subjective history: see above - pt reports increased dizziness and memory problems since hitting head on 07-14-23  Non-Vestibular symptoms: headaches and migraine symptoms  Type of dizziness: Imbalance (Disequilibrium), Unsteady with head/body turns, and dull pain in Lt side of head  Frequency: daily  Duration: mostly constant - worse with movement  Aggravating factors: Induced by motion: bending down to the ground and activity in general  Relieving factors: lying supine, rest, and slow movements  Progression of symptoms: better - pain has improved since initial accident  OCULOMOTOR EXAM:  Ocular Alignment: normal  Ocular ROM: No Limitations  Spontaneous Nystagmus: absent  Gaze-Induced Nystagmus: absent  Smooth Pursuits: intact  Saccades: intact  VESTIBULAR - OCULAR REFLEX:   Slow VOR: Normal    Dynamic Visual Acuity: Static: line 9 Dynamic: line 7 - c/o moderate dizziness upon completion of test   POSITIONAL TESTING: Other: NT based on diagnosis of post concussion   MOTION SENSITIVITY:  Motion Sensitivity Quotient Intensity: 0 = none, 1 = Lightheaded, 2 = Mild, 3 = Moderate, 4 = Severe, 5 =  Vomiting  Intensity  1. Sitting to supine   2. Supine to L side   3. Supine to R side   4. Supine to sitting   5. L Hallpike-Dix   6. Up from L    7. R Hallpike-Dix   8. Up from R    9. Sitting, head tipped to L knee   10. Head up from L knee   11. Sitting, head tipped to R knee   12. Head up from R knee   13. Sitting head turns x5   14.Sitting head nods x5   15.  In stance, 180 turn to L    16. In stance, 180 turn to R                                                                                                                                 TREATMENT DATE: 09-11-23  NeuroRe-ed:  DVA - 3 line difference with mod. c/o dizziness upon completion of test  -Gaze Adaptation:  x1 Viewing Horizontal: Position: standing, Time: 60 secs, Reps: 1, and Comment:   and x1 Viewing Vertical:  Position: standing, Time: 60 secs, Reps: 1, and Comment: min. C/o dizziness upon completion of test   -Standing Balance:  Pt stood on Airex in corner with EC for 10 secs without head movement - then added head turns with targets with EO for improved gaze stabilization Surface: Airex Position: Feet Hip Width Apart Completed with: Eyes Open and Eyes Closed; Head Turns x 5 Reps and Head Nods x 5 Reps  Pt stood with feet together with EC on Airex (for LTG assessment) - 15 secs 1st rep, 25 secs 2nd rep  Self Care:  Reviewed HEP as previously instructed  Reviewed LTG's and progress  Access Code: [email or web address] URL: [email or web address] Date: 08/03/2023 Prepared by: Rock Kussmaul  Exercises - Standing Gaze Stabilization with Head Rotation  - 1 x daily - 7 x weekly - 1 sets - 1-2 reps  PATIENT EDUCATION: Education details: gaze stabilization exercise - increased time for x1 viewing from 45 secs to 60 secs  Person educated: Patient Education method: Explanation, Demonstration, and Handouts Education comprehension: verbalized understanding and returned demonstration  HOME EXERCISE  PROGRAM:  GOALS: Goals reviewed with patient? Yes  SHORT TERM GOALS: same as LTG's as ELOS = 4 weeks   LONG TERM GOALS: Target date: 09-07-23  Pt will subjectively report at least 50% improvement in dizziness. Baseline:  Goal status: Partially met -  pt reports she has good days and bad days  2.  Pt will perform x1 viewing exercise for 1 horizontal & vertical head turns with no c/o dizziness to demo improved gaze stabilization. Baseline: 2 line difference with DVA but moderate c/o dizziness upon test completion;   09-11-23: 3 line difference with c/o moderate dizziness Goal status: Goal not met 09-11-23  3.  Pt will maintain balance on condition 4  of mCTSIB for 30 secs to demo improved vestibular input in maintaining balance.  Baseline: 5.9 secs (08-02-23); 15, 25 secs  Goal status: Partially met 09-11-23  4.  Independent in HEP for balance and vestibular exercises. Baseline:  Goal status: Goal met 09-11-23  5.  Pt will report pain in Lt temple region to </= 2/10 for increased ease with work activities and ADL's. Baseline:  4/10 on 08-02-23; 0/10 on 09-11-23 Goal status:  Goal met 09-11-23  ASSESSMENT:  CLINICAL IMPRESSION: Pt has met LTG's #4 & 5: LTG #1 is partially met as pt reports vertigo continues to be intermittent in  occurrence and is not consistently resolved.  Pt reports she has good days and bad days.  LTG #2 not met as pt has a 3 line difference with DVA with moderate c/o dizziness upon completion of test (pt had a 2 line difference at initial eval).  LTG #3 is partially met as pt able to maintain balance on condition 4 of mCTSIB for 25 secs on 2nd rep - goal set at 30 secs.  Pt continues to report problems with cognition and memory - has speech therapy eval scheduled on 09-18-23.  Pt is discharged from PT due to completion of program and plateau in functional status at this time.    OBJECTIVE IMPAIRMENTS: decreased balance, difficulty walking, and dizziness.   ACTIVITY  LIMITATIONS: bending, reach over head, and locomotion level  PARTICIPATION LIMITATIONS: cleaning, shopping, community activity, occupation, and yard work  PERSONAL FACTORS: Behavior pattern, Fitness, Past/current experiences, and 1-2 comorbidities: h/o multiple concussions are also affecting patient's functional outcome.   REHAB POTENTIAL: Good  CLINICAL DECISION MAKING: Evolving/moderate complexity  EVALUATION COMPLEXITY: Moderate   PLAN:  PT FREQUENCY: 1-2x/week  PT DURATION: 4 weeks  PLANNED INTERVENTIONS: 97110-Therapeutic exercises, 97530- Therapeutic activity, 97112- Neuromuscular re-education, [phone]- Self Care, [id number]- Gait training, Patient/Family education, and Vestibular training  PLAN FOR NEXT SESSION: D/C from PT on 09-11-23   PHYSICAL THERAPY DISCHARGE SUMMARY  Visits from Start of Care: 7  Current functional level related to goals / functional outcomes: See above for progress towards goals   Remaining deficits: Pt continues to report intermittent dizziness with good days and bad days;  dizziness appears to be somewhat multi-factorial in etiology including central etiology from h/o multiple concussions, stress/anxiety, fatigue, and poor sleep hygiene at times.  Pt continues to report feeling of an elevator drop which she says can occur in seated or standing position - no correlation of factors is known   Continued mild impairment of VOR (impaired gaze stabilization) with DVA 3 line difference on 09-11-23 with moderate c/o dizziness upon completion of test Continued decreased vestibular input in maintaining balance  Cont. c/o impaired cognition and memory - pt has Speech therapy eval scheduled on 09-18-23   Education / Equipment: Pt has been instructed in HEP for balance/vestibular exercises and also gaze stabilization exercises   Patient agrees to discharge. Patient goals were partially met. Patient is being discharged due to maximized rehab potential.  Pt has  plateaued in maximizing functional progress at this time regarding physical/vestibular deficits.     Roxanna [place], PT 08/31/2023, 7:58 PM

## 2023-09-12 ENCOUNTER — Ambulatory Visit: Admitting: Licensed Clinical Social Worker

## 2023-09-12 DIAGNOSIS — F603 Borderline personality disorder: Secondary | ICD-10-CM | POA: Diagnosis not present

## 2023-09-12 DIAGNOSIS — F900 Attention-deficit hyperactivity disorder, predominantly inattentive type: Secondary | ICD-10-CM | POA: Diagnosis not present

## 2023-09-12 DIAGNOSIS — F3160 Bipolar disorder, current episode mixed, unspecified: Secondary | ICD-10-CM | POA: Diagnosis not present
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
## 2023-09-13 ENCOUNTER — Encounter: Payer: Self-pay | Admitting: Physical Therapy

## 2023-09-17 ENCOUNTER — Other Ambulatory Visit: Payer: Self-pay | Admitting: Neurology

## 2023-09-17 DIAGNOSIS — M25512 Pain in left shoulder: Secondary | ICD-10-CM | POA: Diagnosis not present

## 2023-09-18 ENCOUNTER — Ambulatory Visit: Admitting: Speech Pathology

## 2023-09-18 ENCOUNTER — Other Ambulatory Visit: Payer: Self-pay

## 2023-09-18 ENCOUNTER — Encounter: Payer: Self-pay | Admitting: Speech Pathology

## 2023-09-18 DIAGNOSIS — R41841 Cognitive communication deficit: Secondary | ICD-10-CM

## 2023-09-18 DIAGNOSIS — R2681 Unsteadiness on feet: Secondary | ICD-10-CM | POA: Diagnosis not present

## 2023-09-18 NOTE — Therapy (Signed)
 OUTPATIENT SPEECH LANGUAGE PATHOLOGY EVALUATION   Patient Name: [name] MRN: [id number] DOB:17-Mar-1970, 53 y.o., female Today's Date: 09/18/2023  PCP: None REFERRING PROVIDER: Dohmeier, Dedra, MD  END OF SESSION:  End of Session - 09/18/23 0846     Visit Number 1    Number of Visits 9    Date for SLP Re-Evaluation 12/11/23    SLP Start Time 0845    SLP Stop Time  0930    SLP Time Calculation (min) 45 min    Activity Tolerance Patient tolerated treatment well          Past Medical History:  Diagnosis Date   Achilles tendinitis    ADHD (attention deficit hyperactivity disorder), inattentive type    Diagnosed as an adult after starting college   Allergy     Arthritis    Arthritis    Ataxia    Back pain    Bipolar I disorder 04/18/2014   most recent episode mixed   Borderline personality disorder 10/27/2011   Cataracts, bilateral    Chest pain    Chronic pain disorder    due to several injuries affecting numerous areas of her body throughout the years   Chronic paroxysmal hemicrania, 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   rt chest wall nodule removal   GANGLION CYST EXCISION  01/30/2009   lipoma removal     right bunioectomy     SHOULDER SURGERY  01/13/2011   right, partial tear   tumor resection left thigh     Patient Active Problem List   Diagnosis Date Noted   Impaired functional mobility, balance, gait, and endurance 03/08/2023   Unexplained falls 03/08/2023   BPV (benign positional vertigo), right 03/08/2023   Other Specified Feeding or Eating Disorder, Emotional and Binge Eating Behaviors 01/15/2023   Pain in joint of right knee 01/15/2023   Other fatigue 09/24/2022   BMI 37.0-37.9, adult 03/14/2022   Shoulder subluxation, left, initial encounter 02/01/2022   Environmental allergies 01/31/2022   Class 3 severe obesity with serious comorbidity and body mass index (BMI) of 40.0 to 44.9 in adult 01/31/2022   Bipolar disorder (HCC) 11/28/2021   Arthritis of first metatarsophalangeal (MTP) joint of right foot 10/24/2021   Migraines 08/18/2021   Cervical radiculopathy 08/16/2021  Lipoma of left forearm 08/16/2021   Carpal tunnel syndrome on left 08/16/2021   Vitamin D  deficiency 07/26/2021   Eating disorder 07/26/2021   Insulin  resistance 07/06/2021   Other hyperlipidemia 07/06/2021   B12 deficiency 07/06/2021   Foot sprain, left, initial encounter 06/01/2021   Mood disorder (HCC) 04/29/2021   Sleep difficulties 04/29/2021   Other constipation 04/29/2021   Essential hypertension 04/29/2021   Sprain of ankle 03/29/2021   Subacromial bursitis of left shoulder joint 03/29/2021   Concussion with no loss of consciousness 02/07/2021   Lumbar radiculopathy 02/07/2021   Shoulder impingement syndrome, left 11/01/2020   Sprain of metacarpophalangeal joint of left thumb 10/07/2020    Posterior tibial tendinitis of right lower extremity 09/13/2020   Labral tear of hip, degenerative 09/13/2020   Attention and concentration deficit 05/07/2020   Morbid obesity (HCC) 05/07/2020   Plantar wart 05/07/2020   Sleep apnea 05/07/2020   Urinary incontinence 05/07/2020   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   Genetic testing 02/27/2018   Family history of genetic disease carrier    Family history of breast cancer    Family history of colon cancer    History of multiple concussions 11/20/2017   Ataxia 11/20/2017   Cervical strain, acute, initial encounter 09/19/2016   Insomnia 02/17/2015   Right shoulder pain 12/31/2014   Episodic cluster headache, not intractable 12/02/2014   Chronic paroxysmal hemicrania, 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   Photophobia of both eyes 05/04/2014   Nausea with vomiting 05/04/2014   Bipolar I disorder, most recent episode mixed (HCC) 04/18/2014   Suicidal ideation 04/12/2014   Injury of right shoulder and upper arm 02/17/2014   Migraine with status migrainosus 01/08/2013   Chronic migraine 05/08/2012   Hypertension    Contact dermatitis 11/27/2011   Generalized anxiety disorder 10/27/2011   ADHD (attention deficit hyperactivity disorder), 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 female  with h/o bipolar, HTN, migraines, anxiety, and four previous concussions presents to the ER 07/15/23 for evaluation after head injury. Around 1800 yesterday, the patient reports that she was bending down when she hit the left side of her head on a purse hook. Denies any LOC. No blood thinner use.  She denies any LOC, syncope, nausea, vomiting.  She is able to work the rest of her shift and took some Tylenol  when she got home.  She reports she woke up this morning with still having some pressure to the left side of her head and around her eyes but no visual changes.  No blurry vision, diplopia, eye pain.  She is concerned that she may have given as of another concussion due to her past medical history of them.  PAIN:  Are you having pain? Yes: NPRS scale: 7/10 Pain location: shoulder left, right shoulder, right knee  Pain description: ache Aggravating factors: exercise Relieving factors: rest,  shot  FALLS: Has patient fallen in last 6 months?  See PT evaluation for details  LIVING ENVIRONMENT: Lives with: lives with their family Lives in: House/apartment  PLOF:  Level of assistance: Independent with ADLs, Needed assistance with IADLS Employment: Part-time employment, On disability  PATIENT GOALS: To be less slow thinking  OBJECTIVE:   Note: Objective measures were completed at Evaluation unless otherwise noted.   COGNITION: Overall cognitive status: Impaired Areas of impairment:  Attention: Impaired: Alternating, Divided Memory: Impaired: Auditory Visual Executive function: Impaired: Organization and Slow processing Functional deficits: difficulty processing and recalling more than 1 task at a time at work  COGNITIVE COMMUNICATION: Following directions: Follows multi-step commands inconsistently  Auditory comprehension: Impaired: for complex directions Verbal expression: Impaired: Named 13 animals  and 8 m words in 1 minute (20 is WNL) Functional communication: Impaired:  Ordered wrong food at [employer], couldn't name order at [facility]  ORAL MOTOR EXAMINATION: Overall status: WFL Comments:   STANDARDIZED ASSESSMENTS: N/A - prior extensive neuropsych eval in chart  PATIENT REPORTED OUTCOME MEASURES (PROM): Cognitive function: Short Form: 24                                                                                                                            TREATMENT DATE:   09/18/23: Eval completed - initiated strategies to support processing of instructions at work including self advocating, repeating back what she has heard and asking for directions in writing to refer back to. Initiated training in verbal compensations for word finding generating description and salient words of target word with usual mod questioning cues after initial instruction and modeling.     PATIENT EDUCATION: Education details: verbal compensations for word finding, compensatory strategies to support processing and memory Person educated: Patient Education method: Explanation, Demonstration, and Verbal cues Education comprehension: verbal cues required and needs further education   GOALS: Goals reviewed with patient? Yes  SHORT TERM GOALS: Target date: 11/30/23  Pt will name 18 items in personally relevant category with  Baseline: Goal status: INITIAL  2.  Pt will generate monthly budget to have enough money to pay bills Baseline:  Goal status: INITIAL  3.  Pt will carryover 2 external aids to recall 2-3 tasks at work with rare min A Baseline:  Goal status: INITIAL  4.  Pt will carryover 2 compensatory strategies to support processing speed with rare min A Baseline:  Goal status: INITIAL  5.  Pt will employ verbal compensations for aphasia in structured naming task with occasional min A 5/7 opportunities Baseline:  Goal status: INITIAL  LONG TERM GOALS: Target date: 12/11/23  Pt will complete complex naming tasks with 90% accuracy and occasional min  A Baseline:  Goal status: INITIAL  2.  Pt will use external aids to monitor discretionary spending weekly to maintain her budget for bills Baseline:  Goal status: INITIAL  3.  Pt will generate script to self advocate for support for processing  speech with rare min A Baseline:  Goal status: INITIAL  4.  Pt will use verbal compensations for word finding in conversation as needed with rare min A Baseline:  Goal status: INITIAL  5.  Improve score on PROM Baseline: 24 Goal status: INITIAL ASSESSMENT:  CLINICAL IMPRESSION: Patient is a 53 y.o. female who was seen today for expressive language, slow processing, impaired selective and alternating attention. She reports having difficulty ordering at restaurants due to word finding impairment. She is unable to follow more than 1 direction or task at a time at work and has had her hours reduced. [name] reports difficulty managing her budget due to memory difficulties. She forgets her balance or forgets to save money for bills due later by over spending for food. I recommend skilled ST to maximize cognition for safety, financial management and communication. .   OBJECTIVE IMPAIRMENTS: include attention, memory, executive functioning, and expressive language. These impairments are limiting patient from ADLs/IADLs and effectively communicating at home and in community. Factors affecting potential to achieve goals and functional outcome are co-morbidities.. Patient will benefit from skilled SLP services to address above impairments and improve overall function.  REHAB POTENTIAL: Good  PLAN:  SLP FREQUENCY: 1-2x/week  SLP DURATION: 12 weeks  PLANNED INTERVENTIONS: Language facilitation, Environmental controls, Cueing hierachy, Cognitive reorganization, Internal/external aids, Functional tasks, Multimodal communication approach, SLP instruction and feedback, Compensatory strategies, Patient/family education, [phone] Treatment of speech (30 or 45 min) ,  and [id number]- Speech Eval Sound Prod, Artic, Phon, Eval Compre, Express    Orvin Netter, Leita Caldron, CCC-SLP 09/18/2023, 10:33 AM

## 2023-09-21 DIAGNOSIS — R5383 Other fatigue: Secondary | ICD-10-CM | POA: Diagnosis not present

## 2023-09-21 DIAGNOSIS — E88819 Insulin resistance, unspecified: Secondary | ICD-10-CM | POA: Diagnosis not present

## 2023-09-21 DIAGNOSIS — E559 Vitamin D deficiency, unspecified: Secondary | ICD-10-CM | POA: Diagnosis not present

## 2023-09-21 DIAGNOSIS — E7849 Other hyperlipidemia: Secondary | ICD-10-CM | POA: Diagnosis not present

## 2023-09-22 LAB — CMP14+EGFR
ALT: 10 IU/L (ref 0–32)
AST: 15 IU/L (ref 0–40)
Albumin: 4.1 g/dL (ref 3.8–4.9)
Alkaline Phosphatase: 86 IU/L (ref 44–121)
BUN/Creatinine Ratio: 7 — ABNORMAL LOW (ref 9–23)
BUN: 7 mg/dL (ref 6–24)
Bilirubin Total: 0.4 mg/dL (ref 0.0–1.2)
CO2: 22 mmol/L (ref 20–29)
Calcium: 9.5 mg/dL (ref 8.7–10.2)
Chloride: 102 mmol/L (ref 96–106)
Creatinine, Ser: 0.94 mg/dL (ref 0.57–1.00)
Globulin, Total: 1.7 g/dL (ref 1.5–4.5)
Glucose: 88 mg/dL (ref 70–99)
Potassium: 4 mmol/L (ref 3.5–5.2)
Sodium: 139 mmol/L (ref 134–144)
Total Protein: 5.8 g/dL — ABNORMAL LOW (ref 6.0–8.5)
eGFR: 73 mL/min/1.73 (ref >59)

## 2023-09-22 LAB — VITAMIN D 25 HYDROXY (VIT D DEFICIENCY, FRACTURES): Vit D, 25-Hydroxy: 51.4 ng/mL (ref 30.0–100.0)

## 2023-09-22 LAB — INSULIN, RANDOM: INSULIN: 5.9 u[IU]/mL (ref 2.6–24.9)

## 2023-09-22 LAB — HEMOGLOBIN A1C
Est. average glucose Bld gHb Est-mCnc: 100 mg/dL
Hgb A1c MFr Bld: 5.1 % (ref 4.8–5.6)

## 2023-09-22 LAB — LIPID PANEL WITH LDL/HDL RATIO
Cholesterol, Total: 204 mg/dL — ABNORMAL HIGH (ref 100–199)
HDL: 50 mg/dL (ref >39)
LDL Chol Calc (NIH): 136 mg/dL — ABNORMAL HIGH (ref 0–99)
LDL/HDL Ratio: 2.7 ratio (ref 0.0–3.2)
Triglycerides: 100 mg/dL (ref 0–149)
VLDL Cholesterol Cal: 18 mg/dL (ref 5–40)

## 2023-09-22 LAB — VITAMIN B12: Vitamin B-12: 899 pg/mL (ref 232–1245)

## 2023-09-25 ENCOUNTER — Telehealth (INDEPENDENT_AMBULATORY_CARE_PROVIDER_SITE_OTHER): Admitting: Psychology

## 2023-09-25 DIAGNOSIS — F909 Attention-deficit hyperactivity disorder, unspecified type: Secondary | ICD-10-CM | POA: Diagnosis not present

## 2023-09-25 DIAGNOSIS — F319 Bipolar disorder, unspecified: Secondary | ICD-10-CM | POA: Diagnosis not present

## 2023-09-25 DIAGNOSIS — F5089 Other specified eating disorder: Secondary | ICD-10-CM

## 2023-09-25 NOTE — Progress Notes (Signed)
  Office: [phone]  /  Fax: [phone]    Date: September 25, 2023  Appointment Start Time: 10:00am Duration: 40 minutes Provider: Wyatt [name], Psy.D. Type of Session: Individual Therapy  Location of Patient: Home (private location) Location of Provider: Provider's Home (private office) Type of Contact: Telepsychological Visit via MyChart Video Visit  Session Content: [name] is a 53 y.o. female presenting for a follow-up appointment to address the previously established treatment goal of increasing coping skills. Today's appointment was a telepsychological visit. [name] provided verbal consent for today's telepsychological appointment and she is aware she is responsible for securing confidentiality on her end of the session. Prior to proceeding with today's appointment, [name]'s physical location at the time of this appointment was obtained as well a phone number she could be reached at in the event of technical difficulties. [name] and this provider participated in today's telepsychological service.   This provider conducted a brief check-in. [name] shared she initiated therapeutic services with Joyce Junk, LCSW [facility]) as well as speech therapy. Associated thoughts and feelings related to initiating with a new primary therapist were explored and processed. Notably, she reported worsening dizziness, vision issues, forgetfulness, and elevator drops. She indicated she has an appointment with neurology on September 2nd; however, [name] was encouraged to inform her neurologist today. Regarding her eating habits, [name] stated she is being more mindful regarding calorie and protein ratios when choosing foods at the grocery store. She was encouraged to develop a Leisure centre manager of foods she has found that fit her calorie and protein goals/prescribed structured meal plan that she also enjoys to help with future grocery trips. Given recent memory-related concerns, [name]  was encouraged to write the aforementioned during today's appointment. She was observed writing. Reviewed the importance of eating enough protein and she agreed to eat protein which each snack/meal. She was also encouraged to measure protein intake. Overall, [name] was receptive to today's appointment as evidenced by openness to sharing, responsiveness to feedback, and willingness to implement discussed strategies .  Mental Status Examination:  Appearance: neat Behavior: appropriate to circumstances Mood: neutral Affect: mood congruent Speech: normal in rate, volume, and tone Eye Contact: appropriate Psychomotor Activity: WNL Gait: unable to assess Thought Process: linear, logical, and goal directed and denies suicidal, homicidal, and self-harm ideation, plan and intent  Thought Content/Perception: no hallucinations, delusions, bizarre thinking or behavior endorsed or observed Orientation: AAOx4 Memory/Concentration: intact Insight: fair Judgment: fair  Interventions:  Conducted a brief chart review Conducted a risk assessment Provided empathic reflections and validation Provided positive reinforcement Employed supportive psychotherapy interventions to facilitate reduced distress and to improve coping skills with identified stressors Engaged patient in problem solving  DSM-5 Diagnosis(es): F50.89 Other Specified Feeding or Eating Disorder, Emotional and Binge Eating Behaviors, F90.9 Unspecified Attention-Deficit/Hyperactivity Disorder , and F31.9 Unspecified Bipolar and Related Disorder  Treatment Goal & Progress: [name] re-established care with this provider and the following treatment goal was established: increase coping skills. [name] has demonstrated progress in her goal as evidenced by her self-report of reducing soda intake, willingness to implement mindfulness-based strategies, and willingness to implement other learned strategies (e.g., urge surfing, guided imagery).    Plan:  The next appointment is scheduled for 10/09/2023 at 9:30am, which will be via MyChart Video Visit. The next session will focus on working towards the established treatment goal. [name] will continue with her primary therapist. She will contact her neurologist after this appointment.    Joyce Fire, PsyD

## 2023-09-26 ENCOUNTER — Encounter (INDEPENDENT_AMBULATORY_CARE_PROVIDER_SITE_OTHER): Payer: Self-pay | Admitting: Physician Assistant

## 2023-09-26 ENCOUNTER — Ambulatory Visit (INDEPENDENT_AMBULATORY_CARE_PROVIDER_SITE_OTHER): Admitting: Physician Assistant

## 2023-09-26 VITALS — BP 121/61 | HR 68 | Temp 98.0°F | Ht 69.0 in | Wt 259.0 lb

## 2023-09-26 DIAGNOSIS — G43901 Migraine, unspecified, not intractable, with status migrainosus: Secondary | ICD-10-CM

## 2023-09-26 DIAGNOSIS — Z87820 Personal history of traumatic brain injury: Secondary | ICD-10-CM

## 2023-09-26 DIAGNOSIS — E7849 Other hyperlipidemia: Secondary | ICD-10-CM | POA: Diagnosis not present

## 2023-09-26 DIAGNOSIS — Z6838 Body mass index (BMI) 38.0-38.9, adult: Secondary | ICD-10-CM

## 2023-09-26 DIAGNOSIS — E88819 Insulin resistance, unspecified: Secondary | ICD-10-CM | POA: Diagnosis not present

## 2023-09-26 DIAGNOSIS — I10 Essential (primary) hypertension: Secondary | ICD-10-CM

## 2023-09-26 DIAGNOSIS — E559 Vitamin D deficiency, unspecified: Secondary | ICD-10-CM

## 2023-09-26 DIAGNOSIS — G43909 Migraine, unspecified, not intractable, without status migrainosus: Secondary | ICD-10-CM | POA: Diagnosis not present

## 2023-09-26 NOTE — Progress Notes (Unsigned)
 SUBJECTIVE: Discussed the use of AI scribe software for clinical note transcription with the patient, who gave verbal consent to proceed.  Chief Complaint: Obesity  Interim History: She is up 1 lb since last visit.   [name] is here to discuss her progress with her obesity treatment plan. She is on the Category 2 Plan and states she is following her eating plan approximately 65 % of the time. She states she is doing yard work.  [name] is a 53 year old female who presents for follow-up regarding weight management and post-concussion symptoms.  She is experiencing embarrassment about her weight, noting a slight increase despite efforts to improve nutrition and physical activity. Her diet includes more fruits and water, but she continues to consume two 16-ounce bottles of Dr. Nunzio daily. She engages in physical activities such as mowing the lawn and using a weed eater, which she recently purchased. Her mother has complimented her efforts, which she finds encouraging.  She has a history of a concussion in June, leading to worsening dizziness. She completed vestibular therapy with seven visits and had a speech therapy evaluation on August 19th. However, due to limited availability, her next speech therapy session is not until October 20th. She experiences difficulty with word retrieval and memory, and reports an 'elevator drop sensation' while standing still or sitting, occurring twice in the car and at work.  She mentions a previous knee injury that continues to bother her, impacting her ability to work.  Her mother recently underwent surgery for a compression fracture, which has been a source of stress for the patient. The surgery involved vertebroplasty, and her mother is now cleared for activity as tolerated and driving.  Her current medications include protein shakes, and she is mindful of her protein intake.  OBJECTIVE: Visit Diagnoses: Problem List Items Addressed This Visit      Morbid obesity (HCC)   Essential hypertension   Insulin  resistance - Primary   Other hyperlipidemia   Vitamin D  deficiency   Obesity Weight gain of one pound with an increase in muscle mass by 3.4 pounds and a decrease in adipose tissue. Current weight loss is 11 pounds, down from a previous highest weight loss of 30 pounds. Excess caloric intake from two 16-ounce bottles of Dr. Nunzio daily. - Encourage reduction of Dr. Nunzio consumption to one mini can per day. - Substitute Dr. Nunzio with Lipton iced tea to reduce sugar and calorie intake. - Increase water intake to at least 80 ounces per day.   Insulin  Resistance Last fasting insulin  was 5.9- nearing goal. A1c was 5.1- at goal. Polyphagia:Yes- at times, Does report drinking sugar sweetened drinks- Dr. Nunzio Discussed impact of SSB on weight loss and overall health .  Medication(s): None Lab Results  Component Value Date   HGBA1C 5.1 09/21/2023   HGBA1C 5.3 03/06/2023   HGBA1C 5.3 03/14/2022   HGBA1C 5.2 07/06/2021   HGBA1C 5.1 02/15/2021   Lab Results  Component Value Date   INSULIN  5.9 09/21/2023   INSULIN  7.3 03/06/2023   INSULIN  7.0 03/14/2022   INSULIN  6.0 07/06/2021   INSULIN  9.3 02/15/2021    Plan:  A1c and Insulin  levels ~ at goal but does endorse regular intake of SSB. Discussed impact on weight loss and health overall.  Continue working on nutrition plan to decrease simple carbohydrates, increase lean proteins and exercise to promote weight loss, improve glycemic control and prevent progression to Type 2 diabetes.    Post-concussion syndrome Persistent symptoms  include worsening dizziness, memory issues, and elevator drop sensation. Previous vestibular therapy completed with seven visits. Speech therapy evaluation on August 19th recommended therapy, but limited availability until October 20th. Neurology appointment with Dr. Chalice scheduled for September 2nd due to previous cancellation. - Attend  neurology appointment with Dr. Chalice on September 2nd. - Continue with scheduled speech therapy on October 20th.  Migraine Aspartame identified as a trigger. Hydration suggested to reduce migraine frequency. - Increase water intake to at least 80 ounces per day.  Hyperlipidemia Cholesterol levels improving with HDL at 50, but LDL still slightly elevated. - Monitor intake of saturated fats, including butter and baked goods.  Hypoproteinemia Total protein levels slightly low with a goal of 6 or above. - Increase protein intake to achieve total protein level of 6 or above. Vitals Temp: 98 F (36.7 C) BP: 121/61 Pulse Rate: 68 SpO2: 96 %   Anthropometric Measurements Height: 5' 9 (1.753 m) Weight: 259 lb (117.5 kg) BMI (Calculated): 38.23 Weight at Last Visit: 258lb Weight Lost Since Last Visit: 0lb Weight Gained Since Last Visit: 1lb Starting Weight: 270lb Total Weight Loss (lbs): 11 lb (4.99 kg)   Body Composition  Body Fat %: 49.6 % Fat Mass (lbs): 128.8 lbs Muscle Mass (lbs): 124.2 lbs Total Body Water (lbs): 92.8 lbs Visceral Fat Rating : 14   Other Clinical Data Fasting: No Labs: No Today's Visit #: 34 Starting Date: 02/15/21     ASSESSMENT AND PLAN:  Diet: [name] is currently in the action stage of change. As such, her goal is to continue with weight loss efforts. She has agreed to Category 2 Plan.  Exercise: [name] has been instructed to work up to a goal of 150 minutes of combined cardio and strengthening exercise per week and to continue exercising as is for weight loss and overall health benefits.   Behavior Modification:  We discussed the following Behavioral Modification Strategies today: increasing lean protein intake, decreasing simple carbohydrates, increasing vegetables, increase H2O intake, decrease liquid calories, increase high fiber foods, no skipping meals, meal planning and cooking strategies, better snacking choices, avoiding  temptations, and planning for success. We discussed various medication options to help [name] with her weight loss efforts and we both agreed to continue current treatment plan.  Return in about 4 weeks (around 10/24/2023).[name] She was informed of the importance of frequent follow up visits to maximize her success with intensive lifestyle modifications for her multiple health conditions.  Attestation Statements:   Reviewed by clinician on day of visit: allergies, medications, problem list, medical history, surgical history, family history, social history, and previous encounter notes.   Time spent on visit including pre-visit chart review and post-visit care and charting was 30 minutes.    Himmat Enberg, PA-C

## 2023-09-28 DIAGNOSIS — M25512 Pain in left shoulder: Secondary | ICD-10-CM | POA: Diagnosis not present

## 2023-10-02 ENCOUNTER — Encounter: Payer: Self-pay | Admitting: Neurology

## 2023-10-02 ENCOUNTER — Ambulatory Visit: Admitting: Neurology

## 2023-10-02 VITALS — BP 120/86 | HR 70 | Ht 69.0 in | Wt 263.0 lb

## 2023-10-02 DIAGNOSIS — R270 Ataxia, unspecified: Secondary | ICD-10-CM | POA: Diagnosis not present

## 2023-10-02 DIAGNOSIS — F0781 Postconcussional syndrome: Secondary | ICD-10-CM | POA: Diagnosis not present

## 2023-10-02 DIAGNOSIS — S0990XA Unspecified injury of head, initial encounter: Secondary | ICD-10-CM | POA: Diagnosis not present

## 2023-10-02 MED ORDER — MECLIZINE HCL 50 MG PO TABS: 50.0000 mg | ORAL_TABLET | Freq: Three times a day (TID) | ORAL | 0 refills | Status: AC | PRN

## 2023-10-02 NOTE — Patient Instructions (Addendum)
 How to Perform the Epley Maneuver The Epley maneuver is an exercise that relieves symptoms of vertigo. Vertigo is the feeling that you or your surroundings are moving when they are not. When you feel vertigo, you may feel like the room is spinning and may have trouble walking. The Epley maneuver is used for a type of vertigo caused by a calcium  deposit in a part of the inner ear. The maneuver involves changing head positions to help the deposit move out of the area. You can do this maneuver at home whenever you have symptoms of vertigo. You can repeat it in 24 hours if your vertigo has not gone away. Even though the Epley maneuver may relieve your vertigo for a few weeks, it is possible that your symptoms will return. This maneuver relieves vertigo, but it does not relieve dizziness. What are the risks? If it is done correctly, the Epley maneuver is considered safe. Sometimes it can lead to dizziness or nausea that goes away after a short time. If you develop other symptoms--such as changes in vision, weakness, or numbness--stop doing the maneuver and call your health care provider. Supplies needed: A bed or table. A pillow. How to do the Epley maneuver     Sit on the edge of a bed or table with your back straight and your legs extended or hanging over the edge of the bed or table. Turn your head halfway toward the affected ear or side as told by your health care provider. Lie backward quickly with your head turned until you are lying flat on your back. Your head should dangle (head-hanging position). You may want to position a pillow under your shoulders. Hold this position for at least 30 seconds. If you feel dizzy or have symptoms of vertigo, continue to hold the position until the symptoms stop. Turn your head to the opposite direction until your unaffected ear is facing down. Your head should continue to dangle. Hold this position for at least 30 seconds. If you feel dizzy or have symptoms  of vertigo, continue to hold the position until the symptoms stop. Turn your whole body to the same side as your head so that you are positioned on your side. Your head will now be nearly facedown and no longer needs to dangle. Hold for at least 30 seconds. If you feel dizzy or have symptoms of vertigo, continue to hold the position until the symptoms stop. Sit back up. You can repeat the maneuver in 24 hours if your vertigo does not go away. Follow these instructions at home: For 24 hours after doing the Epley maneuver: Keep your head in an upright position. When lying down to sleep or rest, keep your head raised (elevated) with two or more pillows. Avoid excessive neck movements. Activity Do not drive or use machinery if you feel dizzy. After doing the Epley maneuver, return to your normal activities as told by your health care provider. Ask your health care provider what activities are safe for you. General instructions Drink enough fluid to keep your urine pale yellow. Do not drink alcohol. Take over-the-counter and prescription medicines only as told by your health care provider. Keep all follow-up visits. This is important. Preventing vertigo symptoms Ask your health care provider if there is anything you should do at home to prevent vertigo. He or she may recommend that you: Keep your head elevated with two or more pillows while you sleep. Do not sleep on the side of your affected ear.  Get up slowly from bed. Avoid sudden movements during the day. Avoid extreme head positions or movement, such as looking up or bending over. Contact a health care provider if: Your vertigo gets worse. You have other symptoms, including: Nausea. Vomiting. Headache. Get help right away if you: Have vision changes. Have a headache or neck pain that is severe or getting worse. Cannot stop vomiting. Have new numbness or weakness in any part of your body. These symptoms may represent a serious  problem that is an emergency. Do not wait to see if the symptoms will go away. Get medical help right away. Call your local emergency services (911 in the [place] Do not drive yourself to the hospital. Summary Vertigo is the feeling that you or your surroundings are moving when they are not. The Epley maneuver is an exercise that relieves symptoms of vertigo. If the Epley maneuver is done correctly, it is considered safe. This information is not intended to replace advice given to you by your health care provider. Make sure you discuss any questions you have with your health care provider. Document Revised: 10/13/2022 Document Reviewed: 10/13/2022 Elsevier Patient Education  2024 [employer]Meclizine  Tablets What is this medication? MECLIZINE  (MEK li zeen) prevents and treats nausea, vomiting, and dizziness caused by motion sickness. It works by Systems analyst maintain its sense of balance. It belongs to a group of medications called antihistamines. This medicine may be used for other purposes; ask your health care provider or pharmacist if you have questions. COMMON BRAND NAME(S): Antivert , Dramamine Less Drowsy, Dramamine-N, Medivert, Meni-D, Travel-Ease What should I tell my care team before I take this medication? They need to know if you have any of these conditions: Glaucoma Lung or breathing disease, such as asthma Problems urinating Prostate disease Stomach or intestine problems An unusual or allergic reaction to meclizine , other medications, foods, dyes, or preservatives Pregnant or trying to get pregnant Breast-feeding How should I use this medication? Take this medication by mouth with water. Take it as directed on the prescription label at the same time every day. If you are using this medication to prevent motion sickness, take the dose at least 1 hour before travel. You can take it with or without food. If it upsets your stomach, take it with food or milk. Talk to your  care team about the use of this medication in children. Special care may be needed. Overdosage: If you think you have taken too much of this medicine contact a poison control center or emergency room at once. NOTE: This medicine is only for you. Do not share this medicine with others. What if I miss a dose? If you miss a dose, take it as soon as you can. If it is almost time for your next dose, take only that dose. Do not take double or extra doses. What may interact with this medication? Do not take this medication with any of the following: MAOIs, such as Carbex, Eldepryl, Marplan, Nardil, and Parnate This medication may also interact with the following: Alcohol Antihistamines for allergy , cough and cold Certain medications for anxiety or sleep Certain medications for depression, such as amitriptyline, fluoxetine, sertraline  Certain medications for seizures, such as phenobarbital, primidone General anesthetics, such as halothane, isoflurane, methoxyflurane, propofol Local anesthetics, such as lidocaine , pramoxine, tetracaine Medications that relax muscles for surgery Opioid medications for pain Phenothiazines, such as chlorpromazine, mesoridazine, prochlorperazine , thioridazine This list may not describe all possible interactions. Give your health care provider a list of all  the medicines, herbs, non-prescription drugs, or dietary supplements you use. Also tell them if you smoke, drink alcohol, or use illegal drugs. Some items may interact with your medicine. What should I watch for while using this medication? Visit your care team for regular checks on your progress. Tell your care team if your symptoms do not start to get better or if they get worse. This medication may affect your coordination, reaction time, or judgment. Do not drive or operate machinery until you know how this medication affects you. Sit up or stand slowly to reduce the risk of dizzy or fainting spells. Drinking alcohol  with this medication can increase the risk of these side effects. Your mouth may get dry. Chewing sugarless gum or sucking hard candy, and drinking plenty of water may help. Contact your care team if the problem does not go away or is severe. This medication may cause dry eyes and blurred vision. If you wear contact lenses, you may feel some discomfort. Lubricating drops may help. See your care team if the problem does not go away or is severe. What side effects may I notice from receiving this medication? Side effects that you should report to your care team as soon as possible: Allergic reactions--skin rash, itching, hives, swelling of the face, lips, tongue, or throat Sudden eye pain or change in vision such as blurry vision, seeing halos around lights, vision loss Trouble passing urine Side effects that usually do not require medical attention (report these to your care team if they continue or are bothersome): Confusion Constipation Dizziness Drowsiness Dry mouth This list may not describe all possible side effects. Call your doctor for medical advice about side effects. You may report side effects to FDA at 1-800-FDA-1088. Where should I keep my medication? Keep out of the reach of children and pets. Store at room temperature between 15 and 30 degrees C (59 and 86 degrees F). Keep container tightly closed. Get rid of any unused medication after the expiration date. NOTE: This sheet is a summary. It may not cover all possible information. If you have questions about this medicine, talk to your doctor, pharmacist, or health care provider.  2024 Elsevier/Gold Standard (2022-12-29 00:00:00)ASSESSMENT AND PLAN :    53 y.o. 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  Could this be vertebral artery related ? She reports no swallowing or numbness.    2) memory - I have had no warning to get a memory test done by [facility].  MMSE 29/ 30 ,  missing one item of immediate recall.    3) mood is anxious, depressed,  feels  down  feels worried . On lamictal  , depakote  and adderall .   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   5) migraine with occipital nerve origin,  my ay invite her back for trigger point injection.    Hx of mild spinal stenosis and trauma to cervical spine. Has completed PT for fall prevention.      CC:   [facility] , to follow up  with this pleasant patient.   Understanding Your Risk for Falls Millions of people have serious injuries from falls each year. It is important to understand your risk of falling. Talk with your health care provider about your risk and what you can do to lower it. If you do have a serious fall, make sure to tell your provider.  Falling once raises your risk of falling again. How can falls affect me? Serious injuries from falls are common. These include: Broken bones, such as hip fractures. Head injuries, such as traumatic brain injuries (TBI) or concussions. A fear of falling can cause you to avoid activities and stay at home. This can make your muscles weaker and raise your risk for a fall. What can increase my risk? There are a number of risk factors that increase your risk for falling. The more risk factors you have, the higher your risk of falling. Serious injuries from a fall happen most often to people who are older than 54 years old. Teenagers and young adults ages 69-29 are also at higher risk. Common risk factors include: Weakness in the lower body. Being generally weak or confused due to long-term (chronic) illness. Dizziness or balance problems. Poor vision. Medicines that cause dizziness or drowsiness. These may include: Medicines for your blood pressure, heart, anxiety, insomnia, or swelling (edema). Pain medicines. Muscle relaxants. Other risk factors include: Drinking alcohol. Having had a fall in the past. Having foot pain or wearing  improper footwear. Working at a dangerous job. Having any of the following in your home: Tripping hazards, such as floor clutter or loose rugs. Poor lighting. Pets. Having dementia or memory loss. What actions can I take to lower my risk of falling?     Physical activity Stay physically fit. Do strength and balance exercises. Consider taking a regular class to build strength and balance. Yoga and tai chi are good options. Vision Have your eyes checked every year and your prescription for glasses or contacts updated as needed. Shoes and walking aids Wear non-skid shoes. Wear shoes that have rubber soles and low heels. Do not wear high heels. Do not walk around the house in socks or slippers. Use a cane or walker as told by your provider. Home safety Attach secure railings on both sides of your stairs. Install grab bars for your bathtub, shower, and toilet. Use a non-skid mat in your bathtub or shower. Attach bath mats securely with double-sided, non-slip rug tape. Use good lighting in all rooms. Keep a flashlight near your bed. Make sure there is a clear path from your bed to the bathroom. Use night-lights. Do not use throw rugs. Make sure all carpeting is taped or tacked down securely. Remove all clutter from walkways and stairways, including extension cords. Repair uneven or broken steps and floors. Avoid walking on icy or slippery surfaces. Walk on the grass instead of on icy or slick sidewalks. Use ice melter to get rid of ice on walkways in the winter. Use a cordless phone. Questions to ask your health care provider Can you help me check my risk for a fall? Do any of my medicines make me more likely to fall? Should I take a vitamin D  supplement? What exercises can I do to improve my strength and balance? Should I make an appointment to have my vision checked? Do I need a bone density test to check for weak bones (osteoporosis)? Would it help to use a cane or a walker? Where  to find more information Centers for Disease Control and Prevention, STEADI: TonerPromos.no Community-Based Fall Prevention Programs: TonerPromos.no [employer] on Aging: BaseRingTones.pl Contact a health care provider if: You fall at home. You are afraid of falling at home. You feel weak, drowsy, or dizzy. This information is not intended to replace advice given to you by your health care provider. Make sure you discuss  any questions you have with your health care provider. Document Revised: 09/19/2021 Document Reviewed: 09/19/2021 Elsevier Patient Education  2024 Elsevier Inc.Ataxia Ataxia is a condition that causes you to be unsteady on your feet when you walk or stand. It can also cause poor coordination of body movements and trouble standing up straight (upright). Ataxia can show up later in life (acquired ataxia), during your 20s or 30s, or into your 60s or later. It also may be present early in life (nonacquired ataxia). There are two main types of nonacquired ataxia: Congenital. This type is present at birth. Hereditary. This type is passed from parent to child. The most common type is Friedreich ataxia. What are the causes?  This condition may be caused by a problem with the part of the brain that deals with coordination and stability (cerebellum). It may form when a condition, such as a stroke or head injury, damages your cerebellum. Acquired ataxia may also be caused by: Neurodegenerative changes. These are changes to your nervous system. Systemic disorders. These are disorders that may affect your whole body. A lot of exposure to: Certain medicines. These include phenytoin and lithium . Solvents. These are cleaning fluids, such as paint thinner, nail polish remover, carpet cleaner, and degreasers. Certain conditions, such as: Alcohol use disorder. Celiac disease. Hypothyroidism. A lack (deficiency) of vitamin E, vitamin B12, or thiamine. An abnormal growth of cells in the brain (brain  tumor). Multiple sclerosis. Cerebral palsy. Paraneoplastic syndromes. These are rare disorders. They are caused by the way the body's defense system (immune system) responds to cancer. Viral infections. The congenital and hereditary forms of this condition are caused by problems that are present in genes before birth. What are the signs or symptoms? Signs and symptoms may vary and depend on the cause of the condition. They may include: Being unsteady. Walking with your legs wide apart to keep your balance. Body movements that are not well coordinated. Trouble keeping an upright posture. Trouble writing. Shaking that you cannot control (tremors). Sudden muscle tightening (spasms). Other symptoms may include: Eye movements that you cannot control. Changes in speech, vision, or both. Trouble swallowing. Tiredness (fatigue). How is this diagnosed? This condition may be diagnosed based on: Your medical history. Your family medical history. A physical exam. You may also have tests, such as: CT scan. MRI. Spinal tap. This is when a needle is used to take a sample of the fluid around your brain and spinal cord. It is also called a lumbar puncture. Genetic testing. Blood tests. How is this treated? Treatment for this condition depends on what is causing it. If the cause is a brain tumor, you may need surgery. Treatment also focuses on making your quality of life better. This may involve: Physical therapy. This helps you learn ways to improve coordination and move around more carefully. Occupational therapy. This can help you with doing daily tasks, such as bathing and feeding yourself. Using assistive devices. These are devices to help you move around, eat, or communicate. They include walkers, modified eating utensils, and communication aids. Speech therapy. This helps you learn ways to communicate and improve swallowing. Follow these instructions at home: Preventing falls Lie down  right away if you become very unsteady, dizzy, or nauseous, or if you feel like you are going to faint. Do not get up until all of those feelings pass. Keep your home well-lit. Use night-lights as needed. Remove tripping hazards. These may include throw rugs, cords, and clutter. Install grab bars by the  toilet and in the bathtub and shower. Use assistive devices, such as a cane, walker, or wheelchair, as needed to keep your balance. General instructions Take over-the-counter and prescription medicines only as told by your health care provider. Do not drink alcohol. Ask your health care provider what activities are safe for you. Keep all follow-up appointments to make sure all your needs are being met and to catch any new problems early. Where to find support National Ataxia Foundation: ataxia.org Contact a health care provider if: You have new symptoms. Your symptoms get worse. Get help right away if: Your unsteadiness gets worse all of a sudden. You have weakness or numbness on one side of your body. You feel confused. You have trouble speaking. You have: A severe headache. Chest pain. An irregular heartbeat. A very fast pulse. Pain in your abdomen. These symptoms may be an emergency. Get help right away. Call 911. Do not wait to see if the symptoms will go away. Do not drive yourself to the hospital. This information is not intended to replace advice given to you by your health care provider. Make sure you discuss any questions you have with your health care provider. Document Revised: 08/01/2021 Document Reviewed: 08/01/2021 Elsevier Patient Education  2024 [employer].

## 2023-10-02 NOTE — Progress Notes (Signed)
 Provider:  Dedra Gores, MD  Primary Care Physician:   Waddell Parkins, PA      Referring Provider:  Artist Cohens, MD [facility].          Chief Complaint according to patient   Patient presents with:          Pt w/mother. Here for f/u for migraines and also concussion on 6/14. Pt states dizziness is still going on, also has like a falling/dropping sensation and happens whether sitting or standing and has occurred while driving.       HISTORY OF PRESENT ILLNESS:  [name] is a 53 y.o. female patient who is here for revisit 10/02/2023 for follow up after PT,  which could not help her with drop attacks. The patient has a possible concussion  and had previously 4 or 5  concussions.  Dizziness reportedly increasing- she feels off balance , not vertigo.  She reports  forgetfulness , misplacing items.  Headaches with an origin in the back of the head and a radiating  into the crown,not to the forehead or behind the eyes.  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   Cannot multitask, or she gets out of her train of thoughts.     Chief concern according to patient :   see above /.       02/17/2019    1:31 PM 08/05/2018    2:43 PM  MMSE - Mini Mental State Exam  Not completed: --   Orientation to time 5 5  Orientation to Place 5 5  Registration 3 3  Attention/ Calculation 5 5  Recall 3 2  Language- name 2 objects 2 2  Language- repeat 1 1  Language- follow 3 step command 3 3  Language- read & follow direction 1 1  Write a sentence 1 1  Copy design 1 1  Copy design-comments  Named 7 animals  Total score 30 29         Review of Systems: Out of a complete 14 system review, the patient complains of only the following symptoms, and all other reviewed systems are negative.:        Social History   Socioeconomic History   Marital status: Single    Spouse name: Not on file   Number of children: 0   Years of education: 16   Highest  education level: Bachelor's degree (e.g., BA, AB, BS)  Occupational History   Occupation: Training and development officer: [employer]  Tobacco Use   Smoking status: Never   Smokeless tobacco: Never  Vaping Use   Vaping status: Never Used  Substance and Sexual Activity   Alcohol use: No    Alcohol/week: 0.0 standard drinks of alcohol   Drug use: No   Sexual activity: Never    Birth control/protection: None  Other Topics Concern   Not on file  Social History Narrative   Caffeine  2 sodas daily, 1 cup coffee daily.   Pt lives with mom   Pt works    Social Drivers of Community education officer: Not on [employer] Insecurity: Not on file  Transportation Needs: Not on file  Physical Activity: Not on file  Stress: Not on file  Social Connections: Not on file    Family History  Problem Relation Age of Onset   Cancer Mother    Kidney disease Mother    Hypertension Mother  Hyperlipidemia Mother    Breast cancer Mother 63       genetic testing- CHEK2 likely pathogenic variant   Endometrial cancer Mother    Obesity Father    Anxiety disorder Father    Depression Father    Heart attack Father    Heart disease Father    Hypertension Father    Bipolar disorder Father    Heart disease Maternal Aunt    Breast cancer Maternal Aunt 30   Breast cancer Maternal Aunt 75   Breast cancer Maternal Aunt 35   Breast cancer Maternal Aunt    Colon cancer Maternal Uncle 4   Alzheimer's disease Maternal Uncle    Dementia Paternal Aunt    Heart disease Maternal Grandmother    Colon cancer Maternal Grandmother 51   Lymphoma Maternal Grandfather 18   Diabetes Paternal Grandfather    Bone cancer Cousin 45   Cervical cancer Cousin        Genetic testing- 'was positive for a gene' had a mastectomy   Kidney disease Other    Depression Other    Anxiety disorder Other    Bipolar disorder Other    Obesity Other    Migraines Neg Hx     Past Medical History:  Diagnosis Date   Achilles  tendinitis    ADHD (attention deficit hyperactivity disorder), inattentive type    Diagnosed as an adult after starting college   Allergy     Arthritis    Arthritis    Ataxia    Back pain    Bipolar I disorder 04/18/2014   most recent episode mixed   Borderline personality disorder 10/27/2011   Cataracts, bilateral    Chest pain    Chronic pain disorder    due to several injuries affecting numerous areas of her body throughout the years   Chronic paroxysmal hemicrania, 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   rt chest wall nodule removal   GANGLION CYST EXCISION  01/30/2009   lipoma removal     right bunioectomy     SHOULDER SURGERY  01/13/2011   right, partial tear   tumor resection left thigh       Current Outpatient Medications on File Prior to Visit  Medication Sig Dispense Refill   amphetamine -dextroamphetamine  (ADDERALL XR)  20 MG 24  hr capsule Take 20 mg by mouth daily.     Ascorbic Acid (VITAMIN C) 1000 MG tablet Take 1,000 mg by mouth daily.     calcium  carbonate (OSCAL) 1500 (600 Ca) MG TABS tablet Take 1,500 mg by mouth in the morning and at bedtime.     cetirizine  (ZYRTEC ) 10 MG tablet Take 10 mg by mouth daily.     Cholecalciferol (D3 5000) 125 MCG (5000 UT) capsule Take 1 capsule (5,000 Units total) by mouth daily.     divalproex  (DEPAKOTE  ER) 500 MG 24 hr tablet TAKE 2 TABLETS BY MOUTH ONCE DAILY AT NIGHT 180 tablet 0   EMGALITY  120 MG/ML SOSY INJECT 120MG  INTO THE SKIN EVERY 30 DAYS 1 mL 1   hydrOXYzine  (VISTARIL ) 50 MG capsule Take 50 mg by mouth in the morning and at bedtime.   2   lamoTRIgine  (LAMICTAL ) 150 MG tablet Take 300 mg by mouth at bedtime.  1   LATUDA  20 MG TABS tablet Take 20 mg by mouth every morning.     lurasidone  (LATUDA ) 80 MG TABS tablet Take 1 tablet (80 mg total) by mouth daily with supper. (Along with 20mg  in AM for mood) 30 tablet 0   metoprolol  succinate (TOPROL -XL) 100 MG 24 hr tablet Take 1 tablet (100 mg total) by mouth daily. Take with or immediately following a meal. 30 tablet 0   Multiple Vitamin (MULTIVITAMIN WITH MINERALS) TABS tablet Take 1 tablet by mouth daily.     mupirocin ointment (BACTROBAN) 2 % Apply 1 Application topically as needed (irritation).     Omega-3 Fatty Acids (OMEGA 3 PO) Take 1 capsule by mouth daily.     promethazine  (PHENERGAN ) 25 MG tablet Take 1 tablet (25 mg total) by mouth 2 (two) times daily as needed for nausea or vomiting. 30 tablet 1   Rimegepant Sulfate (NURTEC) 75 MG TBDP Take 1 tablet at the onset of migraine. Only 1 tablet in 24 hours. 9 tablet 11   traZODone  (DESYREL ) 100 MG tablet Take 1 tablet (100 mg total) by mouth at bedtime. 30 tablet 0   Zinc 50 MG TABS Take 50 mg by mouth daily.     Current Facility-Administered Medications on File Prior to Visit  Medication Dose Route Frequency Provider Last Rate Last Admin   bupivacaine  (MARCAINE ) 0.5 %  (with pres) injection 2.5 mL  2.5 mL Infiltration Once Farheen Pfahler, Dedra, MD       lidocaine  (XYLOCAINE ) 2 % (with pres) injection 50 mg  2.5 mL Intradermal Once Huberta Tompkins, Dedra, MD       methylPREDNISolone  acetate (DEPO-MEDROL ) injection 80 mg  80 mg Intramuscular Once         Allergies  Allergen Reactions   Other Itching    Steri-strip also hand rash and redness.   Adhesive [Tape] Itching and Rash    Also reacted to Steri Strips and Band-Aids.   Amoxicillin Hives   Hydromorphone Hcl Other (See Comments) and Itching   Medroxyprogesterone Acetate Other (See Comments)   Morphine  Nausea And Vomiting   Oxycodone -Acetaminophen  Other (See Comments) and Itching   Penicillins Hives    Has patient had a PCN reaction causing immediate rash, facial/tongue/throat swelling, SOB or lightheadedness with hypotension: YES  Has patient had a PCN reaction causing severe rash involving mucus membranes or skin necrosis: NO  Has patient had a PCN reaction that required hospitalization NO  Has patient had a PCN reaction occurring within the last 10 years:NO  If  all of the above answers are NO, then may proceed with Cephalosporin use.   Percocet [Oxycodone -Acetaminophen ] Itching   Prednisone  Other (See Comments)    Pt reports Prednisone  induces Manic episodes   Provera [Medroxyprogesterone Acetate] Other (See Comments)    Causes manic episodes   Tramadol  Hcl Other (See Comments) and Itching     DIAGNOSTIC DATA (LABS, IMAGING, TESTING) - I reviewed patient records, labs, notes, testing and imaging myself where available.  Lab Results  Component Value Date   WBC 6.0 03/06/2023   HGB 14.8 03/06/2023   HCT 44.6 03/06/2023   MCV 92 03/06/2023   PLT 251 03/06/2023      Component Value Date/Time   NA 139 09/21/2023 1050   K 4.0 09/21/2023 1050   CL 102 09/21/2023 1050   CO2 22 09/21/2023 1050   GLUCOSE 88 09/21/2023 1050   GLUCOSE 101 (H) 08/17/2021 1526   BUN 7 09/21/2023 1050   CREATININE  0.94 09/21/2023 1050   CREATININE 0.83 09/11/2011 1524   CALCIUM  9.5 09/21/2023 1050   PROT 5.8 (L) 09/21/2023 1050   ALBUMIN 4.1 09/21/2023 1050   AST 15 09/21/2023 1050   ALT 10 09/21/2023 1050   ALKPHOS 86 09/21/2023 1050   BILITOT 0.4 09/21/2023 1050   GFRNONAA >60 08/18/2021 1755   GFRAA 69 01/15/2020 0948   Lab Results  Component Value Date   CHOL 204 (H) 09/21/2023   HDL 50 09/21/2023   LDLCALC 136 (H) 09/21/2023   TRIG 100 09/21/2023   CHOLHDL 3.4 07/06/2021   Lab Results  Component Value Date   HGBA1C 5.1 09/21/2023   Lab Results  Component Value Date   VITAMINB12 899 09/21/2023   Lab Results  Component Value Date   TSH 2.530 03/06/2023    PHYSICAL EXAM:  Vitals:   10/02/23 1325  BP: 120/86  Pulse: 70   Orthostatic VS for the past 24 hrs (Last 3 readings):  BP- Lying Pulse- Lying BP- Sitting Pulse- Sitting BP- Standing at 0 minutes Pulse- Standing at 0 minutes BP- Standing at 3 minutes Pulse- Standing at 3 minutes  10/02/23 1328 120/80 69 126/86 74 122/84 78 120/80 74   Body mass index is 38.84 kg/m.   Wt Readings from Last 3 Encounters:  10/02/23 263 lb (119.3 kg)  09/26/23 259 lb (117.5 kg)  08/27/23 258 lb (117 kg)     Ht Readings from Last 3 Encounters:  10/02/23 5' 9 (1.753 m)  09/26/23 5' 9 (1.753 m)  08/27/23 5' 9 (1.753 m)      General: The patient is awake, alert and appears not in acute distress and groomed. Head: Normocephalic, atraumatic.   Neck is supple.  Orthostatics were normal.   Eppley maneuver did not provoke vertical nystagmus , only at horizontal endpoint.   Reports feeling dizzy.  Cardiovascular:  Regular rate and cardiac rhythm by pulse, without distended neck veins. Respiratory: no shortness of breath  Skin:  With evidence of ankle edema,Trunk: BMI is 39    NEUROLOGIC EXAM: The patient is awake and alert, oriented to place and time.   Memory subjective described as impaired, forgetful. Misplacing items,    Attention span & concentration ability appears normal.   Speech is fluent,  with mild dysphonia.  Mood and affect are depressed, anxious, worried.    Neurological Examination: Mental Status: Intact. Language and speech are normal. No cognitive deficits during our interview.  Cranial Nerves II-XII: Intact.  PERL. EOMI. VFF. No unprovoked nystagmus.  No  facial droop.   Right mild ptosis.  Hearing is grossly intact bilaterally.  The tongue is midline. Motor: Strengths are 5/5 throughout.  Muscle bulk and tone are normal. No tremors.   Coordination: No ataxia or dysmetria.    Sensory: Grossly intact throughout to all modalities. Reflexes: Normal and symmetric throughout. No ankle clonus.   Gait and Station:   unable to perform tandem gait, walks wide based, feet pointing outwards, turning with 4 steps.  Holding onto wall or furniture when turning.   Walking straight when focusing her gaze on an object in the distance.   Romberg positive with backward and  forward swaying.    ASSESSMENT AND PLAN :   53 y.o. year old female  here with:    1) Ataxia, balance is off, Romberg is positive,  non vestibular.  Could this be vertebral artery related ? She reports no swallowing or numbness.   2) memory - I have had no warning to get a memory test done by CMA.  MMSE 29/ 30 , missing one item of immediate recall.   3) mood is anxious, depressed,  feels  down  feels worried . On lamictal  , depakote  and adderall .  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  5) migraine with occipital nerve origin,  my ay invite her back for trigger point injection.   Hx of mild spinal stenosis and trauma to cervical spine. Has completed PT for fall prevention.  Educated about fall prevention, about  epley maneuver at home. And about prn meclizine .    CC:   [facility] , to follow up  with this pleasant patient.   The patient's condition requires frequent monitoring and  adjustments in the treatment plan, reflecting the ongoing complexity of care.  This provider is the continuing focal point for all needed services for this condition.  After spending a total time of 40 minutes face to face and time for  history taking, physical and neurologic examination, review of laboratory studies,  personal review of imaging studies, reports and results of other testing and review of referral information / records as far as provided in visit,   Electronically signed by: Dedra Gores, MD 10/02/2023 2:13 PM  [facility] Board certified by The [employer] of Sleep Medicine and Diplomate of the [employer] of [employer] Medicine. Board certified In Neurology through the ABPN, Fellow of the [employer] of Neu[employer].

## 2023-10-03 ENCOUNTER — Ambulatory Visit: Admitting: Licensed Clinical Social Worker

## 2023-10-03 NOTE — Telephone Encounter (Signed)
 Called the patient and there was no answer. LVM advising the pt that a [facility] message was sent informing a slot that I could work her in.   **IF pt calls back please offer 9/17  at 11:30 am

## 2023-10-04 DIAGNOSIS — M25512 Pain in left shoulder: Secondary | ICD-10-CM | POA: Diagnosis not present

## 2023-10-08 ENCOUNTER — Other Ambulatory Visit: Payer: Self-pay

## 2023-10-08 ENCOUNTER — Emergency Department (HOSPITAL_COMMUNITY)
Admission: EM | Admit: 2023-10-08 | Discharge: 2023-10-08 | Disposition: A | Discharge status: Home / Self Care | Payer: Worker's Compensation | Attending: Emergency Medicine | Admitting: Emergency Medicine

## 2023-10-08 ENCOUNTER — Telehealth (INDEPENDENT_AMBULATORY_CARE_PROVIDER_SITE_OTHER): Admitting: Psychology

## 2023-10-08 ENCOUNTER — Telehealth (INDEPENDENT_AMBULATORY_CARE_PROVIDER_SITE_OTHER): Payer: Self-pay | Admitting: Psychology

## 2023-10-08 ENCOUNTER — Emergency Department (HOSPITAL_COMMUNITY)

## 2023-10-08 ENCOUNTER — Encounter (HOSPITAL_COMMUNITY): Payer: Self-pay

## 2023-10-08 DIAGNOSIS — S62002A Unspecified fracture of navicular [scaphoid] bone of left wrist, initial encounter for closed fracture: Secondary | ICD-10-CM

## 2023-10-08 DIAGNOSIS — M25512 Pain in left shoulder: Secondary | ICD-10-CM | POA: Insufficient documentation

## 2023-10-08 DIAGNOSIS — W19XXXA Unspecified fall, initial encounter: Secondary | ICD-10-CM | POA: Insufficient documentation

## 2023-10-08 DIAGNOSIS — Y990 Civilian activity done for income or pay: Secondary | ICD-10-CM | POA: Diagnosis not present

## 2023-10-08 DIAGNOSIS — M25561 Pain in right knee: Secondary | ICD-10-CM | POA: Insufficient documentation

## 2023-10-08 DIAGNOSIS — M25532 Pain in left wrist: Secondary | ICD-10-CM | POA: Insufficient documentation

## 2023-10-08 DIAGNOSIS — M25469 Effusion, unspecified knee: Secondary | ICD-10-CM

## 2023-10-08 MED ORDER — IBUPROFEN 800 MG PO TABS
800.0000 mg | ORAL_TABLET | Freq: Once | ORAL | Status: AC
  Administered 2023-10-08: 800 mg via ORAL
  Filled 2023-10-08: qty 1

## 2023-10-08 NOTE — ED Provider Notes (Signed)
 Accepted handoff at shift change from [place], [place]. Please see prior provider note for more detail.   Briefly: Patient is 53 y.o.   DDX: concern for fall, wrist sprain, scaphoid fracture, femur fracture, hip fracture  Plan: Disposition per results of xray imaging  Physical Exam  BP 139/72   Pulse 74   Temp 98.3 F (36.8 C) (Oral)   Resp 18   SpO2 96%   Physical Exam Vitals and nursing note reviewed.  Constitutional:      General: She is not in acute distress.    Appearance: She is well-developed.  HENT:     Head: Normocephalic and atraumatic.  Eyes:     Conjunctiva/sclera: Conjunctivae normal.  Cardiovascular:     Rate and Rhythm: Normal rate and regular rhythm.     Heart sounds: No murmur heard. Pulmonary:     Effort: Pulmonary effort is normal. No respiratory distress.     Breath sounds: Normal breath sounds.  Abdominal:     Palpations: Abdomen is soft.     Tenderness: There is no abdominal tenderness.  Musculoskeletal:        General: Tenderness and signs of injury present. No swelling.       Arms:     Cervical back: Neck supple.     Comments: TTP along the left thumb base with no bruising seen. TTP along the right medial knee and left hip.  Skin:    General: Skin is warm and dry.     Capillary Refill: Capillary refill takes less than 2 seconds.  Neurological:     Mental Status: She is alert.  Psychiatric:        Mood and Affect: Mood normal.     Procedures  Procedures  ED Course / MDM   Clinical Course as of 10/08/23 1625  Mon Oct 08, 2023  1505 Mechanical fall, pending imaging reads.  [OZ]    Clinical Course User Index [OZ] Cecily Legrand LABOR, PA-C   Medical Decision Making Amount and/or Complexity of Data Reviewed Radiology: ordered.  Risk Prescription drug management.   In brief, patient here with concerns of mechanical fall that occurred yesterday at work.  She states that she was started by a coworker and fell landing on her left side as  well as striking the right knee.  She endorses pain primarily to the left hip, left wrist, and right knee.  No reported head injury or loss of consciousness.  Not on blood thinners.  Handoff at this time is pending x-ray results.  Patient well-controlled on over-the-counter pain medications at this time.  X-rays are largely reassuring although the left wrist has some concerns for a cortical irregularity which could indicate a scaphoid fracture.  Patient does have tenderness in this area.  Will place into a thumb spica splint and advise close follow-up with hand surgery for further assessment.  Did also inform patient of other findings including the suprapatellar effusion on the right side likely secondary to patient's injury.  Advise continue management of over-the-counter pain medications and localized ice for pain relief.  Provided patient with contact formation for hand surgery.  She verbalized understanding need for hand surgery follow-up and evaluation.  She is otherwise stable at this time for outpatient follow-up and discharged home.         Chaka Jefferys A, PA-C 10/08/23 1625    Freddi Hamilton, MD 10/11/23 [phone]

## 2023-10-08 NOTE — ED Triage Notes (Signed)
 Pt was at work and fell. Pt states she fell sideways on her left side and hit left shoulder, wrist, hip, and inside of right knee. Denies any LOC. Pt got startled by a coworker who was standing too close to her when she stood up from her cart.

## 2023-10-08 NOTE — Discharge Instructions (Addendum)
 You are seen in the department today for concerns of a fall.  Your x-rays were largely reassuring but the left wrist x-ray has some concerns for possible fracture of your scaphoid bone.  This is the bone at the base of your left thumb.  Given this concerning finding, you are placed into a thumb spica splint and advised to follow-up with a hand surgeon for further assessment.  Take Tylenol  and ibuprofen  for pain as needed.  Return to the emergency department for any concerns or worsening symptoms.  Otherwise, follow-up with your primary care provider.

## 2023-10-08 NOTE — Telephone Encounter (Signed)
  Office: [phone]  /  Fax: [phone]  Date of Call: October 08, 2023  Duration of Call: ~7 minute(s) Provider: Wyatt Fire, PsyD  CONTENT:  This provider called [name] to check-in as she did not present for today's f/u appt. She reported she was at [facility] ER due to a fall at work yesterday. She reported experiencing ongoing pain. As such, she was receptive to rescheduling today's appointment. [name] also reported a plan to reschedule her canceled appointment with her primary therapist. A brief risk assessment was completed. [name] denied experiencing suicidal, homicidal and self-harm ideation, plan, or intent since the last appointment with this provider. All questions/concerns addressed.   PLAN: [name] is scheduled for an appointment on 10/16/2023 at 10am.

## 2023-10-08 NOTE — ED Provider Notes (Signed)
 [place] [facility] DEPARTMENT AT [facility] Provider Note   CSN: [id number] Arrival date & time: 10/08/23  1312     Patient presents with: Fall  HPI [name] is a 53 y.o. female presenting for fall.  Occurred yesterday at work.  She states she was startled by a coworker and fell landing on her left side also hitting her right knee.  She is now endorsing left shoulder and wrist hip and right knee pain.  Denies any pain in the chest or abdomen or her back.  Pain is worse with movement.  She is ambulatory with steady gait.    Fall       Prior to Admission medications   Medication Sig Start Date End Date Taking? Authorizing Provider  amphetamine -dextroamphetamine  (ADDERALL XR) 20 MG 24 hr capsule Take 20 mg by mouth daily.    [provider]  Ascorbic Acid (VITAMIN C) 1000 MG tablet Take 1,000 mg by mouth daily.    [provider]  calcium  carbonate (OSCAL) 1500 (600 Ca) MG TABS tablet Take 1,500 mg by mouth in the morning and at bedtime.    [provider]  cetirizine  (ZYRTEC ) 10 MG tablet Take 10 mg by mouth daily. 06/20/21   [provider]  Cholecalciferol (D3 5000) 125 MCG (5000 UT) capsule Take 1 capsule (5,000 Units total) by mouth daily. 03/05/21   Opalski, Barnie, DO  divalproex  (DEPAKOTE  ER) 500 MG 24 hr tablet TAKE 2 TABLETS BY MOUTH ONCE DAILY AT NIGHT 07/02/23   Dohmeier, Dedra, MD  EMGALITY  120 MG/ML SOSY INJECT 120MG  INTO THE SKIN EVERY 30 DAYS 09/17/23   Dohmeier, Dedra, MD  hydrOXYzine  (VISTARIL ) 50 MG capsule Take 50 mg by mouth in the morning and at bedtime.  05/18/17   [provider]  lamoTRIgine  (LAMICTAL ) 150 MG tablet Take 300 mg by mouth at bedtime. 07/27/17   [provider]  LATUDA  20 MG TABS tablet Take 20 mg by mouth every morning. 04/28/19   [provider]  lurasidone  (LATUDA ) 80 MG TABS tablet Take 1 tablet (80 mg total) by mouth daily with supper. (Along with 20mg  in AM for mood)  04/25/21   Opalski, Barnie, DO  meclizine  (ANTIVERT ) 50 MG tablet Take 1 tablet (50 mg total) by mouth 3 (three) times daily as needed for dizziness. 10/02/23   Dohmeier, Dedra, MD  metoprolol  succinate (TOPROL -XL) 100 MG 24 hr tablet Take 1 tablet (100 mg total) by mouth daily. Take with or immediately following a meal. 01/06/17   Money, Caron NOVAK, FNP  Multiple Vitamin (MULTIVITAMIN WITH MINERALS) TABS tablet Take 1 tablet by mouth daily.    [provider]  mupirocin ointment (BACTROBAN) 2 % Apply 1 Application topically as needed (irritation). 01/28/20   [provider]  Omega-3 Fatty Acids (OMEGA 3 PO) Take 1 capsule by mouth daily.    [provider]  promethazine  (PHENERGAN ) 25 MG tablet Take 1 tablet (25 mg total) by mouth 2 (two) times daily as needed for nausea or vomiting. 02/02/21   Dohmeier, Dedra, MD  Rimegepant Sulfate (NURTEC) 75 MG TBDP Take 1 tablet at the onset of migraine. Only 1 tablet in 24 hours. 12/26/22   Millikan, [name], NP  traZODone  (DESYREL ) 100 MG tablet Take 1 tablet (100 mg total) by mouth at bedtime. 04/25/21   Midge Barnie, DO  Zinc 50 MG TABS Take 50 mg by mouth daily.    [provider]    Allergies: Other, Adhesive [tape],  Amoxicillin, Hydromorphone hcl, Medroxyprogesterone acetate, Morphine , Oxycodone -acetaminophen , Penicillins, Percocet [oxycodone -acetaminophen ], Prednisone , Provera [medroxyprogesterone acetate], and Tramadol  hcl    Review of Systems See HPI   Physical Exam   Vitals:   10/08/23 1318  BP: 139/72  Pulse: 74  Resp: 18  Temp: 98.3 F (36.8 C)  SpO2: 96%    CONSTITUTIONAL:  well-appearing, NAD NEURO:  Alert and oriented x 3, CN 3-12 grossly intact EYES:  eyes equal and reactive ENT/NECK:  Supple, no stridor  CARDIO:  regular rate and rhythm, appears well-perfused  PULM:  No respiratory distress, CTAB GI/GU:  non-distended, soft nontender MSK/SPINE:  No gross deformities, no edema, moves all  extremities.  Range of motion of left shoulder and wrist are normal tenderness elicited with movement but no obvious swelling erythema or ecchymosis or obvious deformity in the left shoulder or wrist.  Left hip also appears normal without evidence of trauma patient is ambulatory around the room.  Right knee is without swelling edema ecchymosis. SKIN:  no rash, atraumatic  *Additional and/or pertinent findings included in MDM below  (all labs ordered are listed, but only abnormal results are displayed) Labs Reviewed - No data to display  EKG: None  Radiology: No results found.   Procedures   Medications Ordered in the ED  ibuprofen  (ADVIL ) tablet 800 mg (800 mg Oral Given 10/08/23 1408)    Clinical Course as of 10/08/23 1510  Mon Oct 08, 2023  1505 Mechanical fall, pending imaging reads.  [OZ]    Clinical Course User Index [OZ] Cecily Legrand LABOR, PA-C                                 Medical Decision Making Amount and/or Complexity of Data Reviewed Radiology: ordered.  Risk Prescription drug management.   53 year old well-appearing female presenting for a mechanical fall.  Exam is unremarkable.  X-rays are pending.  Signed out patient to PA Ariel Zelaya.  If x-rays are nonacute patient could be discharged with PCP follow-up.  Was advise NSAIDs and supportive treatment at home.  At this time she is in no acute distress, well-appearing, hemodynamically stable.     Final diagnoses:  Fall, initial encounter    ED Discharge Orders     None          Lang Norleen MARLA DEVONNA 10/08/23 1510    Dasie Faden, MD 10/10/23 1128

## 2023-10-08 NOTE — Progress Notes (Unsigned)
  Office: [phone]  /  Fax: [phone]    Date: October 08, 2023  Appointment Start Time: *** Duration: *** minutes Provider: Wyatt Fire, Psy.D. Type of Session: Individual Therapy  Location of Patient: {gbptloc:23249} (private location) Location of Provider: Provider's Home (private office) Type of Contact: Telepsychological Visit via MyChart Video Visit  Session Content: [name] is a 53 y.o. female presenting for a follow-up appointment to address the previously established treatment goal of increasing coping skills.Today's appointment was a telepsychological visit. [name] provided verbal consent for today's telepsychological appointment and she is aware she is responsible for securing confidentiality on her end of the session. Prior to proceeding with today's appointment, [name]'s physical location at the time of this appointment was obtained as well a phone number she could be reached at in the event of technical difficulties. [name] and this provider participated in today's telepsychological service.   This provider conducted a brief check-in. *** [name] was receptive to today's appointment as evidenced by openness to sharing, responsiveness to feedback, and {gbreceptiveness:23401}.  Mental Status Examination:  Appearance: {Appearance:22431} Behavior: {Behavior:22445} Mood: {gbmood:21757} Affect: {Affect:22436} Speech: {Speech:22432} Eye Contact: {Eye Contact:22433} Psychomotor Activity: {Motor Activity:22434} Gait: {gbgait:23404} Thought Process: {thought process:22448}  Thought Content/Perception: {disturbances:22451} Orientation: {Orientation:22437} Memory/Concentration: {gbcognition:22449} Insight: {Insight:22446} Judgment: {Insight:22446}  Interventions:  {Interventions for Progress Notes:23405}  DSM-5 Diagnosis(es): F50.89 Other Specified Feeding or Eating Disorder, Emotional and Binge Eating Behaviors, F90.9 Unspecified Attention-Deficit/Hyperactivity Disorder  , and F31.9 Unspecified Bipolar and Related Disorder  Treatment Goal & Progress: During the initial appointment with this provider, the following treatment goal was established: increase coping skills. [name] has demonstrated progress in her goal as evidenced by {gbtxprogress:22839}. [name] also {gbtxprogress2:22951}.  Plan: The next appointment is scheduled for *** at ***, which will be via MyChart Video Visit. The next session will focus on {Plan for Next Appointment:23400}.   Wyatt Fire, PsyD

## 2023-10-09 ENCOUNTER — Other Ambulatory Visit: Payer: Self-pay | Admitting: Neurology

## 2023-10-09 ENCOUNTER — Telehealth (INDEPENDENT_AMBULATORY_CARE_PROVIDER_SITE_OTHER): Admitting: Psychology

## 2023-10-10 DIAGNOSIS — F900 Attention-deficit hyperactivity disorder, predominantly inattentive type: Secondary | ICD-10-CM | POA: Diagnosis not present

## 2023-10-10 DIAGNOSIS — F603 Borderline personality disorder: Secondary | ICD-10-CM | POA: Diagnosis not present

## 2023-10-10 DIAGNOSIS — F3181 Bipolar II disorder: Secondary | ICD-10-CM | POA: Diagnosis not present

## 2023-10-16 ENCOUNTER — Telehealth (INDEPENDENT_AMBULATORY_CARE_PROVIDER_SITE_OTHER): Admitting: Psychology

## 2023-10-16 DIAGNOSIS — F319 Bipolar disorder, unspecified: Secondary | ICD-10-CM

## 2023-10-16 DIAGNOSIS — M25511 Pain in right shoulder: Secondary | ICD-10-CM | POA: Diagnosis not present

## 2023-10-16 DIAGNOSIS — F909 Attention-deficit hyperactivity disorder, unspecified type: Secondary | ICD-10-CM

## 2023-10-16 DIAGNOSIS — F5089 Other specified eating disorder: Secondary | ICD-10-CM | POA: Diagnosis not present

## 2023-10-16 NOTE — Progress Notes (Signed)
 Office: [phone]  /  Fax: [phone]    Date: October 16, 2023  Appointment Start Time: 10:01am Duration: 32 minutes Provider: Wyatt Fire, Psy.D. Type of Session: Individual Therapy  Location of Patient: Home (private location) Location of Provider: Provider's Home (private office) Type of Contact: Telepsychological Visit via MyChart Video Visit  Session Content: [name] is a 53 y.o. female presenting for a follow-up appointment to address the previously established treatment goal of increasing coping skills.Today's appointment was a telepsychological visit. Meyer provided verbal consent for today's telepsychological appointment and she is aware she is responsible for securing confidentiality on her end of the session. Prior to proceeding with today's appointment, [name]'s physical location at the time of this appointment was obtained as well a phone number she could be reached at in the event of technical difficulties. [name] and this provider participated in today's telepsychological service.   This provider conducted a brief check-in. [name] shared she is feeling less than perfect. She described feeling frustrated due to ongoing medical issues/appointments, work issues, mother's health, and relationship with mother, adding she has not followed-up with her primary therapist but promised to call today. A risk assessment was completed. [name] denied experiencing suicidal, self-harm and homicidal ideation, plan, and intent since the last appointment with this provider. She reported she continues to acknowledge understanding regarding the importance of reaching out to trusted individuals and/or emergency resources if she is unable to ensure safety.   This provider reviewed her role with the clinic. While [name] described various stressors, she expressed desire to focus on eating habits to continue to improve her well-being. She stated she continues to work toward increasing water  intake, reducing eating out, and cooking at home more. Positive reinforcement was provided. Remainder of today's appointment focused on identifying one goal [name] can focus on between now and the next appointment with this provider to help her continue working toward her goals with the clinic. She agreed to the following: consume only the equivalent of one mini can of Dr. Nunzio a day. Overall, [name] was receptive to today's appointment as evidenced by openness to sharing, responsiveness to feedback, and willingness to work toward the established goal.  Mental Status Examination:  Appearance: neat Behavior: agitated Mood: frustrated Affect: mood congruent Speech: normal in rate, volume, and tone Eye Contact: appropriate Psychomotor Activity: WNL Gait: unable to assess Thought Process: linear, logical, and goal directed and denies suicidal, homicidal, and self-harm ideation, plan and intent  Thought Content/Perception: no hallucinations, delusions, bizarre thinking or behavior endorsed or observed Orientation: AAOx4 Memory/Concentration: intact Insight: fair Judgment: fair  Interventions:  Conducted a brief chart review Conducted a risk assessment Provided empathic reflections and validation Reviewed content from the previous session Provided positive reinforcement Employed supportive psychotherapy interventions to facilitate reduced distress and to improve coping skills with identified stressors Engaged patient in problem solving  DSM-5 Diagnosis(es): F50.89 Other Specified Feeding or Eating Disorder, Emotional and Binge Eating Behaviors, F90.9 Unspecified Attention-Deficit/Hyperactivity Disorder , and F31.9 Unspecified Bipolar and Related Disorder  Treatment Goal & Progress: [name] re-established care with this provider and the following treatment goal was established: increase coping skills. [name] has demonstrated progress in her goal as evidenced by her self-report of reducing  soda intake, willingness to implement mindfulness-based strategies, and willingness to implement other learned strategies (e.g., urge surfing, guided imagery).   Plan: The next appointment is scheduled for 10/30/2023 at 11am, which will be via MyChart Video Visit. The next session will focus on working towards the established  treatment goal. [name] will continue with her psychiatric provider and primary therapist.    Wyatt Fire, PsyD

## 2023-10-17 ENCOUNTER — Ambulatory Visit: Admitting: Neurology

## 2023-10-18 DIAGNOSIS — M25511 Pain in right shoulder: Secondary | ICD-10-CM | POA: Diagnosis not present

## 2023-10-23 ENCOUNTER — Encounter: Payer: Self-pay | Admitting: Neurology

## 2023-10-23 ENCOUNTER — Ambulatory Visit: Admitting: Neurology

## 2023-10-23 VITALS — BP 136/81 | HR 78

## 2023-10-23 DIAGNOSIS — G44311 Acute post-traumatic headache, intractable: Secondary | ICD-10-CM

## 2023-10-23 DIAGNOSIS — G43019 Migraine without aura, intractable, without status migrainosus: Secondary | ICD-10-CM | POA: Diagnosis not present

## 2023-10-23 DIAGNOSIS — M5481 Occipital neuralgia: Secondary | ICD-10-CM | POA: Diagnosis not present

## 2023-10-23 DIAGNOSIS — G43109 Migraine with aura, not intractable, without status migrainosus: Secondary | ICD-10-CM | POA: Diagnosis not present

## 2023-10-23 DIAGNOSIS — M542 Cervicalgia: Secondary | ICD-10-CM | POA: Diagnosis not present

## 2023-10-23 DIAGNOSIS — F0781 Postconcussional syndrome: Secondary | ICD-10-CM

## 2023-10-23 MED ORDER — BETAMETHASONE SOD PHOS & ACET 6 (3-3) MG/ML IJ SUSP: 6.0000 mg | Freq: Once | INTRAMUSCULAR | Status: AC

## 2023-10-23 MED ORDER — BUPIVACAINE HCL 0.5 % IJ SOLN: 1.0000 mL | Freq: Once | INTRAMUSCULAR | Status: AC

## 2023-10-23 MED ORDER — LIDOCAINE HCL 2 % IJ SOLN: 1.0000 mL | Freq: Once | INTRAMUSCULAR | Status: AC

## 2023-10-23 NOTE — Progress Notes (Signed)
 .        Provider:  Dedra Gores, MD  Primary Care Physician:  Pridgen, Taylar, NP [address] [place] [address]     Referring Provider: Domenick Loma, Np [address] [place],  [place] [address]          Chief Complaint according to patient   Patient presents with:                HISTORY OF PRESENT ILLNESS:  [name] is a 53 y.o. female patient who is here for revisit 10/23/2023 for occiptal  nerve block,  in the diagnsois of cervico- occipital neuralgia .    Chief concern according to patient :   Occipital nerve blocks today, Major and Minor NN occipitalis.   Performed by Dedra Gores, MD .    30-gauge needle was used. All procedures a documented below were medically necessary, reasonable and appropriate based on the patient's history, medical diagnosis and physician opinion. Verbal informed consent was obtained from the patient, patient was informed of potential risk of procedure, including bruising, bleeding, hematoma formation, infection, muscle weakness, muscle pain, numbness, transient hypertension, transient hyperglycemia and transient insomnia among others. All areas injected were topically clean with isopropyl rubbing alcohol. Nonsterile nonlatex gloves were worn during the procedure.  1. Greater occipital nerve block [phone]). The greater occipital nerve site was identified at the nuchal line medial to the occipital artery. Medication was injected into the left and right occipital nerve areas and suboccipital areas. Patient's condition is associated with inflammation of the greater occipital nerve and associated multiple groups. Injection was deemed medically necessary, reasonable and appropriate. Injection represents a separate and unique surgical service.  2. Lesser occipital nerve block [phone]). The lesser occipital nerve site was identified approximately 2 cm lateral to the greater occipital nerve. Occasion was injected into the left  and right occipital nerve areas. Patient's condition is associated with inflammation of the lesser occipital nerve and associated muscle groups. Injection was deemed medically necessary, reasonable and appropriate. Injection represents a separate and unique surgical service.  The sternocleidomastoid muscle was palpated but not injected.   The deltoid and trapezius muscles were tender to palpation and  injected at 2 locations.         Social History   Socioeconomic History   Marital status: Single    Spouse name: Not on file   Number of children: 0   Years of education: 16   Highest education level: Bachelor's degree (e.g., BA, AB, BS)  Occupational History   Occupation: Training and development officer: [employer]  Tobacco Use   Smoking status: Never   Smokeless tobacco: Never  Vaping Use   Vaping status: Never Used  Substance and Sexual Activity   Alcohol use: No    Alcohol/week: 0.0 standard drinks of alcohol   Drug use: No   Sexual activity: Never    Birth control/protection: None  Other Topics Concern   Not on file  Social History Narrative   Caffeine  2 sodas daily, 1 cup coffee daily.   Pt lives with mom   Pt works    Social Drivers of Community education officer: Not on [employer] Insecurity: Not on file  Transportation Needs: Not on file  Physical Activity: Not on file  Stress: Not on file  Social Connections: Not on file    Family History  Problem Relation Age of Onset   Cancer Mother  Kidney disease Mother    Hypertension Mother    Hyperlipidemia Mother    Breast cancer Mother 59       genetic testing- CHEK2 likely pathogenic variant   Endometrial cancer Mother    Obesity Father    Anxiety disorder Father    Depression Father    Heart attack Father    Heart disease Father    Hypertension Father    Bipolar disorder Father    Heart disease Maternal Aunt    Breast cancer Maternal Aunt 85   Breast cancer Maternal Aunt 53   Breast cancer  Maternal Aunt 35   Breast cancer Maternal Aunt    Colon cancer Maternal Uncle 3   Alzheimer's disease Maternal Uncle    Dementia Paternal Aunt    Heart disease Maternal Grandmother    Colon cancer Maternal Grandmother 45   Lymphoma Maternal Grandfather 20   Diabetes Paternal Grandfather    Bone cancer Cousin 45   Cervical cancer Cousin        Genetic testing- 'was positive for a gene' had a mastectomy   Kidney disease Other    Depression Other    Anxiety disorder Other    Bipolar disorder Other    Obesity Other    Migraines Neg Hx     Past Medical History:  Diagnosis Date   Achilles tendinitis    ADHD (attention deficit hyperactivity disorder), inattentive type    Diagnosed as an adult after starting college   Allergy     Arthritis    Arthritis    Ataxia    Back pain    Bipolar I disorder 04/18/2014   most recent episode mixed   Borderline personality disorder 10/27/2011   Cataracts, bilateral    Chest pain    Chronic pain disorder    due to several injuries affecting numerous areas of her body throughout the years   Chronic paroxysmal hemicrania, 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   rt chest wall nodule removal   GANGLION CYST EXCISION  01/30/2009   lipoma removal     right bunioectomy     SHOULDER SURGERY  01/13/2011   right, partial tear   tumor resection left thigh       Current Outpatient Medications on File Prior  to Visit  Medication Sig Dispense Refill   amphetamine -dextroamphetamine  (ADDERALL XR) 20 MG 24 hr capsule Take 20 mg by mouth daily.     Ascorbic Acid (VITAMIN C) 1000 MG tablet Take 1,000 mg by mouth daily.     calcium  carbonate (OSCAL) 1500 (600 Ca) MG TABS tablet Take 1,500 mg by mouth in the morning and at bedtime.     cetirizine  (ZYRTEC ) 10 MG tablet Take 10 mg by mouth daily.     Cholecalciferol (D3 5000) 125 MCG (5000 UT) capsule Take 1 capsule (5,000 Units total) by mouth daily.     divalproex  (DEPAKOTE  ER) 500 MG 24 hr tablet Take 2 tablets (1,000 mg total) by mouth at bedtime. 180 tablet 0   EMGALITY  120 MG/ML SOSY INJECT 120MG  INTO THE SKIN EVERY 30 DAYS 1 mL 1   hydrOXYzine  (VISTARIL ) 50 MG capsule Take 50 mg by mouth in the morning and at bedtime.   2   lamoTRIgine  (LAMICTAL ) 150 MG tablet Take 300 mg by mouth at bedtime.  1   LATUDA  20 MG TABS tablet Take 20 mg by mouth every morning.     lurasidone  (LATUDA ) 80 MG TABS tablet Take 1 tablet (80 mg total) by mouth daily with supper. (Along with 20mg  in AM for mood) 30 tablet 0   meclizine  (ANTIVERT ) 50 MG tablet Take 1 tablet (50 mg total) by mouth 3 (three) times daily as needed for dizziness. 30 tablet 0   metoprolol  succinate (TOPROL -XL) 100 MG 24 hr tablet Take 1 tablet (100 mg total) by mouth daily. Take with or immediately following a meal. 30 tablet 0   Multiple Vitamin (MULTIVITAMIN WITH MINERALS) TABS tablet Take 1 tablet by  mouth daily.     mupirocin ointment (BACTROBAN) 2 % Apply 1 Application topically as needed (irritation).     Omega-3 Fatty Acids (OMEGA 3 PO) Take 1 capsule by mouth daily.     promethazine  (PHENERGAN ) 25 MG tablet Take 1 tablet (25 mg total) by mouth 2 (two) times daily as needed for nausea or vomiting. 30 tablet 1   Rimegepant Sulfate (NURTEC) 75 MG TBDP Take 1 tablet at the onset of migraine. Only 1 tablet in 24 hours. 9 tablet 11   traZODone  (DESYREL ) 100 MG tablet Take 1 tablet (100 mg total) by mouth at bedtime. 30 tablet 0   Zinc 50 MG TABS Take 50 mg by mouth daily.     Current Facility-Administered Medications on File Prior to Visit  Medication Dose Route Frequency Provider Last Rate Last Admin   bupivacaine  (MARCAINE ) 0.5 % (with pres) injection 2.5 mL  2.5 mL Infiltration Once Sherrye Puga, Dedra, MD       lidocaine  (XYLOCAINE ) 2 % (with pres) injection 50 mg  2.5 mL Intradermal Once Zaylynn Rickett, Dedra, MD       methylPREDNISolone  acetate (DEPO-MEDROL ) injection 80 mg  80 mg Intramuscular Once         Allergies  Allergen Reactions   Other Itching    Steri-strip also hand rash and redness.   Adhesive [Tape] Itching and Rash    Also reacted to Steri Strips and Band-Aids.   Amoxicillin Hives   Hydromorphone Hcl Other (See Comments) and Itching   Medroxyprogesterone Acetate Other (See Comments)   Morphine  Nausea And Vomiting   Oxycodone -Acetaminophen  Other (See Comments) and Itching   Penicillins Hives    Has patient had a PCN reaction causing immediate rash, facial/tongue/throat swelling, SOB or lightheadedness with hypotension: YES  Has patient had a  PCN reaction causing severe rash involving mucus membranes or skin necrosis: NO  Has patient had a PCN reaction that required hospitalization NO  Has patient had a PCN reaction occurring within the last 10 years:NO  If all of the above answers are NO, then may proceed with Cephalosporin use.   Percocet [Oxycodone -Acetaminophen ]  Itching   Prednisone  Other (See Comments)    Pt reports Prednisone  induces Manic episodes   Provera [Medroxyprogesterone Acetate] Other (See Comments)    Causes manic episodes   Tramadol  Hcl Other (See Comments) and Itching     DIAGNOSTIC DATA (LABS, IMAGING, TESTING) - I reviewed patient records, labs, notes, testing and imaging myself where available.  Lab Results  Component Value Date   WBC 6.0 03/06/2023   HGB 14.8 03/06/2023   HCT 44.6 03/06/2023   MCV 92 03/06/2023   PLT 251 03/06/2023      Component Value Date/Time   NA 139 09/21/2023 1050   K 4.0 09/21/2023 1050   CL 102 09/21/2023 1050   CO2 22 09/21/2023 1050   GLUCOSE 88 09/21/2023 1050   GLUCOSE 101 (H) 08/17/2021 1526   BUN 7 09/21/2023 1050   CREATININE 0.94 09/21/2023 1050   CREATININE 0.83 09/11/2011 1524   CALCIUM  9.5 09/21/2023 1050   PROT 5.8 (L) 09/21/2023 1050   ALBUMIN 4.1 09/21/2023 1050   AST 15 09/21/2023 1050   ALT 10 09/21/2023 1050   ALKPHOS 86 09/21/2023 1050   BILITOT 0.4 09/21/2023 1050   GFRNONAA >60 08/18/2021 1755   GFRAA 69 01/15/2020 0948   Lab Results  Component Value Date   CHOL 204 (H) 09/21/2023   HDL 50 09/21/2023   LDLCALC 136 (H) 09/21/2023   TRIG 100 09/21/2023   CHOLHDL 3.4 07/06/2021   Lab Results  Component Value Date   HGBA1C 5.1 09/21/2023   Lab Results  Component Value Date   VITAMINB12 899 09/21/2023   Lab Results  Component Value Date   TSH 2.530 03/06/2023    PHYSICAL EXAM:  Vitals:   10/23/23 1142  BP: 136/81  Pulse: 78   No data found. There is no height or weight on file to calculate BMI.   Wt Readings from Last 3 Encounters:  10/02/23 263 lb (119.3 kg)  09/26/23 259 lb (117.5 kg)  08/27/23 258 lb (117 kg)     Ht Readings from Last 3 Encounters:  10/02/23 5' 9 (1.753 m)  09/26/23 5' 9 (1.753 m)  08/27/23 5' 9 (1.753 m)      General: The patient is awake, alert and appears not in acute distress and groomed. Head:  Normocephalic, atraumatic.      NEUROLOGIC EXAM: The patient is awake and alert, oriented to place and time.   Memory subjective described as intact.  Attention span & concentration ability appears normal.   Speech is fluent,  without  dysarthria, dysphonia or aphasia.  Mood and affect are appropriate.   Neurological Examination: Mental Status: Intact. Language and speech are normal. No cognitive deficits. Cranial Nerves II-XII: Intact. PERL. EOMI. VFF. No nystagmus.  No facial droop.  No ptosis.  Hearing is grossly intact bilaterally. The tongue is normal and midline.   ASSESSMENT AND PLAN :    Completed occipital nerve blocks without complication,  there was no bleeding or VS instability.    Electronically signed by: Dedra Gores, MD 10/23/2023 11:53 AM  [facility] Sleep Board certified by The [employer] of Sleep Medicine and Diplomate of the  American Academy of Sleep Medicine. Board certified In Neurology through the ABPN, Fellow of the [employer] of Neu[employer].

## 2023-10-23 NOTE — Progress Notes (Signed)
 Nerve block with steroid: Pt signed consent   0.5% Bupivocaine 1 mL LOT: [id number] EXP: 01/2026 NDC: 55150-250-50   2% Lidocaine  1 mL LOT: [id number] EXP: 12/2025 NDC: 44849-744-79  Betamethasone  1mL LOT: [id number] EXP:03/01/24 NDC: 9482-9279-98   Witnessed by: Rojean H/ Dr Dohmeier

## 2023-10-23 NOTE — Patient Instructions (Signed)
 30-gauge needle was used. All procedures a documented below were medically necessary, reasonable and appropriate based on the patient's history, medical diagnosis and physician opinion. Verbal informed consent was obtained from the patient, patient was informed of potential risk of procedure, including bruising, bleeding, hematoma formation, infection, muscle weakness, muscle pain, numbness, transient hypertension, transient hyperglycemia and transient insomnia among others. All areas injected were topically clean with isopropyl rubbing alcohol. Nonsterile nonlatex gloves were worn during the procedure.  1. Greater occipital nerve block [phone]). The greater occipital nerve site was identified at the nuchal line medial to the occipital artery. Medication was injected into the left and right occipital nerve areas and suboccipital areas. Patient's condition is associated with inflammation of the greater occipital nerve and associated multiple groups. Injection was deemed medically necessary, reasonable and appropriate. Injection represents a separate and unique surgical service.  2. Lesser occipital nerve block [id number]). The lesser occipital nerve site was identified approximately 2 cm lateral to the greater occipital nerve. Occasion was injected into the left and right occipital nerve areas. Patient's condition is associated with inflammation of the lesser occipital nerve and associated muscle groups. Injection was deemed medically necessary, reasonable and appropriate. Injection represents a separate and unique surgical service.

## 2023-10-28 NOTE — Progress Notes (Deleted)
   SUBJECTIVE: Discussed the use of AI scribe software for clinical note transcription with the patient, who gave verbal consent to proceed.  Chief Complaint: Obesity  Interim History: ***  [name] is here to discuss her progress with her obesity treatment plan. She is on the {HWW Weight Loss Plan:210964005} and states she {CHL AMB IS/IS NOT:210130109} following her eating plan approximately *** % of the time. She states she {CHL AMB IS/IS NOT:210130109} exercising *** minutes *** times per week.   OBJECTIVE: Visit Diagnoses: Problem List Items Addressed This Visit     Morbid obesity (HCC)   Insulin  resistance - Primary   Other hyperlipidemia   Vitamin D  deficiency   Migraines    No data recorded No data recorded No data recorded No data recorded   ASSESSMENT AND PLAN:  Diet: [name] {CHL AMB IS/IS NOT:210130109} currently in the action stage of change. As such, her goal is to {HWW Weight Loss Efforts:210964006}. She {HAS HAS WNU:81165} agreed to {HWW Weight Loss Plan:210964005}.  Exercise: [name] has been instructed {HWW Exercise:210964007} for weight loss and overall health benefits.   Behavior Modification:  We discussed the following Behavioral Modification Strategies today: {HWW Behavior Modification:210964008}. We discussed various medication options to help [name] with her weight loss efforts and we both agreed to ***.  No follow-ups on file.[name] She was informed of the importance of frequent follow up visits to maximize her success with intensive lifestyle modifications for her multiple health conditions.  Attestation Statements:   Reviewed by clinician on day of visit: allergies, medications, problem list, medical history, surgical history, family history, social history, and previous encounter notes.   Time spent on visit including pre-visit chart review and post-visit care and charting was *** minutes.    Muaz Shorey, PA-C

## 2023-10-29 ENCOUNTER — Ambulatory Visit (INDEPENDENT_AMBULATORY_CARE_PROVIDER_SITE_OTHER): Admitting: Physician Assistant

## 2023-10-29 DIAGNOSIS — E559 Vitamin D deficiency, unspecified: Secondary | ICD-10-CM

## 2023-10-29 DIAGNOSIS — E7849 Other hyperlipidemia: Secondary | ICD-10-CM

## 2023-10-29 DIAGNOSIS — E88819 Insulin resistance, unspecified: Secondary | ICD-10-CM

## 2023-10-29 DIAGNOSIS — G43901 Migraine, unspecified, not intractable, with status migrainosus: Secondary | ICD-10-CM

## 2023-10-30 ENCOUNTER — Telehealth (INDEPENDENT_AMBULATORY_CARE_PROVIDER_SITE_OTHER): Admitting: Psychology

## 2023-10-30 DIAGNOSIS — F319 Bipolar disorder, unspecified: Secondary | ICD-10-CM

## 2023-10-30 DIAGNOSIS — F909 Attention-deficit hyperactivity disorder, unspecified type: Secondary | ICD-10-CM

## 2023-10-30 DIAGNOSIS — F5089 Other specified eating disorder: Secondary | ICD-10-CM

## 2023-10-30 NOTE — Progress Notes (Signed)
  Office: [phone]  /  Fax: [phone]    Date: October 30, 2023  Appointment Start Time: 11:00am Duration: 43 minutes Provider: Wyatt Fire, Psy.D. Type of Session: Individual Therapy  Location of Patient: Home (private location) Location of Provider: Provider's Home (private office) Type of Contact: Telepsychological Visit via MyChart Video Visit  Session Content: [name] is a 53 y.o. female presenting for a follow-up appointment to address the previously established treatment goal of increasing coping skills. Today's appointment was a telepsychological visit. [name] provided verbal consent for today's telepsychological appointment and she is aware she is responsible for securing confidentiality on her end of the session. Prior to proceeding with today's appointment, [name]'s physical location at the time of this appointment was obtained as well a phone number she could be reached at in the event of technical difficulties. [name] and this provider participated in today's telepsychological service.   This provider conducted a brief check-in. [name] shared, Stressed as usual. 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 Further explored. [name] stated she does not have to pay for visits with this provider but was informed that the co-pay with her primary therapist is $20. It was recommended again that she f/u with her insurance to obtain clarification. She noted ongoing cognitive issues (e.g., word finding difficulties, forgetfulness), adding she will be initiating speech therapy on October 20th. This provider reviewed her role with the clinic and given the abovementioned challenges, this provider recommended she contact her insurance company during this appointment. [name] agreed and was observed chatting with a representative. She did not receive proper clarification; therefore, this provider recommended contacting [facility] to discuss further. Remainder of session focused on processing thoughts/feelings regarding meeting with another provider for behavioral health services. Notably, this provider recommended [name] take a small notebook and pen to work to make notes to ensure she is completing tasks as requested. Overall, [name] was receptive to today's appointment as evidenced by openness to sharing and responsiveness to feedback.  Mental Status Examination:  Appearance: neat Behavior: agitated  Mood: frustrated Affect: mood congruent Speech: WNL Eye Contact: appropriate Psychomotor Activity: WNL Gait: unable to assess Thought Process: linear, logical, and goal directed and denies suicidal, homicidal, and self-harm ideation, plan and intent  Thought Content/Perception: no hallucinations, delusions, bizarre thinking or behavior endorsed or observed Orientation: AAOx4 Memory/Concentration: intact Insight: fair Judgment: fair  Interventions:  Conducted a brief chart review Conducted a risk assessment Provided empathic reflections and validation Employed supportive psychotherapy interventions to facilitate reduced distress and to improve coping skills with identified stressors Recommended/discussed options for longer-term therapeutic services  DSM-5 Diagnosis(es): F50.89 Other Specified Feeding or Eating Disorder, Emotional and Binge Eating Behaviors, F90.9 Unspecified Attention-Deficit/Hyperactivity Disorder , and 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 [name] has demonstrated progress in her goal as evidenced by her self-report of reducing soda intake, willingness to implement mindfulness-based strategies, and willingness to implement other learned strategies (e.g., urge surfing, guided imagery).   Plan: The next appointment is  scheduled for 11/20/2023 at 2:30pm, which will be via MyChart Video Visit. The next session will focus on working towards the established treatment goal and possible termination. [name] will f/u with [facility] to clarify co-pay cost and schedule a f/u appointment.    Wyatt Fire, PsyD

## 2023-10-31 DIAGNOSIS — M25511 Pain in right shoulder: Secondary | ICD-10-CM | POA: Diagnosis not present

## 2023-11-05 DIAGNOSIS — M5459 Other low back pain: Secondary | ICD-10-CM | POA: Diagnosis not present

## 2023-11-08 DIAGNOSIS — M25511 Pain in right shoulder: Secondary | ICD-10-CM | POA: Diagnosis not present

## 2023-11-13 DIAGNOSIS — M25511 Pain in right shoulder: Secondary | ICD-10-CM | POA: Diagnosis not present

## 2023-11-19 ENCOUNTER — Ambulatory Visit: Payer: Self-pay | Attending: Neurology | Admitting: Speech Pathology

## 2023-11-19 ENCOUNTER — Encounter: Payer: Self-pay | Admitting: Speech Pathology

## 2023-11-19 DIAGNOSIS — H43813 Vitreous degeneration, bilateral: Secondary | ICD-10-CM | POA: Diagnosis not present

## 2023-11-19 DIAGNOSIS — R41841 Cognitive communication deficit: Secondary | ICD-10-CM | POA: Insufficient documentation

## 2023-11-19 DIAGNOSIS — Z961 Presence of intraocular lens: Secondary | ICD-10-CM | POA: Diagnosis not present

## 2023-11-19 DIAGNOSIS — H31002 Unspecified chorioretinal scars, left eye: Secondary | ICD-10-CM | POA: Diagnosis not present

## 2023-11-19 NOTE — Patient Instructions (Signed)
   Flustered is an internal distraction - if affects your word finding and safety maneuvering   Use some breathing or calming words to help you focus  Stress causes your words to get bottled up  Great job using the planner and checking it on Sunday

## 2023-11-19 NOTE — Therapy (Signed)
 OUTPATIENT SPEECH LANGUAGE PATHOLOGY TREATMENT   Patient Name: [name] MRN: [id number] DOB:11-Dec-1970, 52 y.o., female Today's Date: 11/19/2023  PCP: None REFERRING PROVIDER: Dohmeier, Dedra, MD  END OF SESSION:  End of Session - 11/19/23 0934     Visit Number 2    Number of Visits 9    Date for Recertification  12/11/23    SLP Start Time 0932    SLP Stop Time  1015    SLP Time Calculation (min) 43 min    Activity Tolerance Patient tolerated treatment well          Past Medical History:  Diagnosis Date   Achilles tendinitis    ADHD (attention deficit hyperactivity disorder), inattentive type    Diagnosed as an adult after starting college   Allergy     Arthritis    Arthritis    Ataxia    Back pain    Bipolar I disorder 04/18/2014   most recent episode mixed   Borderline personality disorder 10/27/2011   Cataracts, bilateral    Chest pain    Chronic pain disorder    due to several injuries affecting numerous areas of her body throughout the years   Chronic paroxysmal hemicrania, 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   rt chest wall nodule removal   GANGLION CYST EXCISION  01/30/2009   lipoma removal     right bunioectomy     SHOULDER SURGERY  01/13/2011   right, partial tear   tumor resection left thigh     Patient Active Problem List   Diagnosis Date Noted   Postconcussion syndrome 10/02/2023   Impaired functional mobility, balance, gait, and endurance 03/08/2023   Unexplained falls 03/08/2023   BPV (benign positional vertigo), right 03/08/2023   Other Specified Feeding or Eating Disorder, Emotional and Binge Eating Behaviors 01/15/2023   Pain in joint of right knee 01/15/2023   Other fatigue 09/24/2022   BMI 37.0-37.9, adult 03/14/2022   Shoulder subluxation, left, initial encounter 02/01/2022   Environmental allergies 01/31/2022   Class 3 severe obesity with serious comorbidity and body mass index (BMI) of 40.0 to 44.9 in adult (HCC) 01/31/2022   Bipolar disorder (HCC) 11/28/2021   Arthritis of first metatarsophalangeal (MTP) joint of right foot 10/24/2021   Migraines 08/18/2021  Cervical radiculopathy 08/16/2021   Lipoma of left forearm 08/16/2021   Carpal tunnel syndrome on left 08/16/2021   Vitamin D  deficiency 07/26/2021   Eating disorder 07/26/2021   Insulin  resistance 07/06/2021   Other hyperlipidemia 07/06/2021   B12 deficiency 07/06/2021   Foot sprain, left, initial encounter 06/01/2021   Mood disorder 04/29/2021   Sleep difficulties 04/29/2021   Other constipation 04/29/2021   Essential hypertension 04/29/2021   Sprain of ankle 03/29/2021   Subacromial bursitis of left shoulder joint 03/29/2021   Concussion with no loss of consciousness 02/07/2021   Lumbar radiculopathy 02/07/2021   Shoulder impingement syndrome, left 11/01/2020   Sprain of metacarpophalangeal joint of  left thumb 10/07/2020   Posterior tibial tendinitis of right lower extremity 09/13/2020   Labral tear of hip, degenerative 09/13/2020   Attention and concentration deficit 05/07/2020   Morbid obesity (HCC) 05/07/2020   Plantar wart 05/07/2020   Sleep apnea 05/07/2020   Urinary incontinence 05/07/2020   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   Genetic testing 02/27/2018   Family history of genetic disease carrier    Family history of breast cancer    Family history of colon cancer    History of multiple concussions 11/20/2017   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 11/20/2017   Cervical strain, acute, initial encounter 09/19/2016   Insomnia 02/17/2015   Right shoulder pain 12/31/2014   Episodic cluster headache, not intractable 12/02/2014   Chronic paroxysmal hemicrania, 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   Photophobia of both eyes 05/04/2014   Nausea with vomiting 05/04/2014   Bipolar I disorder, most recent episode mixed (HCC) 04/18/2014   Suicidal ideation 04/12/2014   Injury of right shoulder and upper arm 02/17/2014   Migraine with status migrainosus 01/08/2013   Chronic migraine 05/08/2012   Hypertension    Contact dermatitis 11/27/2011   Generalized anxiety disorder 10/27/2011   ADHD (attention deficit hyperactivity disorder), inattentive type 10/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 female with h/o bipolar, HTN, migraines, anxiety, and four previous concussions presents to the ER 07/15/23 for evaluation after head injury. Around 1800 yesterday, the patient reports that she was bending down when she hit the left side of her head on a purse hook. Denies any LOC. No blood thinner use.  She denies any LOC, syncope, nausea, vomiting.  She is able to work the rest of her shift and took some Tylenol  when she got home.  She reports she woke up this morning with still having some pressure to the left side of her head and around her eyes but no visual changes.  No blurry vision, diplopia, eye pain.  She is concerned that she may have given as of another concussion due to her past medical history of them.  PAIN:  Are you having pain? Yes: NPRS scale: 7/10 Pain location: shoulder left, right shoulder, right knee  Pain description:  ache Aggravating factors: exercise Relieving factors: rest, shot  FALLS: Has patient fallen in last 6 months?  See PT evaluation for details  LIVING ENVIRONMENT: Lives with: lives with their family Lives in: House/apartment  PLOF:  Level of assistance: Independent with ADLs, Needed assistance with IADLS Employment: Part-time employment, On disability  PATIENT GOALS: I overwhelmed and I can't find my words  OBJECTIVE:   Note: Objective measures were completed at Evaluation unless otherwise noted.   COGNITION: Overall cognitive status: Impaired Areas of impairment:  Attention: Impaired: Alternating, Divided Memory: Impaired: Auditory Visual Executive function: Impaired: Organization and Slow processing Functional deficits: difficulty processing and recalling more than 1 task at a time at work                                                                                                                             TREATMENT DATE:   11/19/23: Holley has increased hours at work with a Writer- due to  increased money, she is not falling short at the end of the month and has been able to pay all bills.Generated strategy to reduce clutter in her home to support her attention and safety in the home. [name] continues to report word finding difficulty especially when flustered - we generated strategy of self advocating - letting her supervisor and co-workers know that she is overwhelmed and having trouble finding her words. 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 With rare min A, she named and wrote 15 - with semantic cues, she named 5 more for a total of 20. [name] reports she is using a planner, and keeping it in the same spot and usually remembers to turn to the new week on Sunday, however she forgets to write down appointments occasionally. Strategy of keeping her planner on top of her piles on her table as visual cues. Moderately complex word finding with given letter required usual mod semantic and fill in the blank cues to name 12 words.   09/18/23: Eval completed - initiated strategies to support processing of instructions at work including self advocating, repeating back what she has heard and asking for directions in writing to refer back to. Initiated training in verbal compensations for word finding generating description and salient words of target word with usual mod questioning cues after initial instruction and modeling.     PATIENT EDUCATION: Education details: verbal compensations for word finding, compensatory strategies to support processing and memory Person educated: Patient Education method: Explanation, Demonstration, and Verbal cues Education comprehension: verbal cues required and needs further education   GOALS: Goals reviewed with patient? Yes  SHORT TERM GOALS: Target date: 11/30/23  Pt will name 18 items in personally relevant category with  Baseline: Goal status: ONGOING  2.  Pt will generate monthly budget to have  enough money to pay bills Baseline:  Goal status:ONGOING  3.  Pt will carryover 2 external aids to  recall 2-3 tasks at work with rare min A Baseline:  Goal status: ONGOING  4.  Pt will carryover 2 compensatory strategies to support processing speed with rare min A Baseline:  Goal status: ONGOING  5.  Pt will employ verbal compensations for aphasia in structured naming task with occasional min A 5/7 opportunities Baseline:  Goal status: ONGOING  LONG TERM GOALS: Target date: 12/11/23  Pt will complete complex naming tasks with 90% accuracy and occasional min A Baseline:  Goal status: ONGOING  2.  Pt will use external aids to monitor discretionary spending weekly to maintain her budget for bills Baseline:  Goal status: ONGOING  3.  Pt will generate script to self advocate for support for processing speech with rare min A Baseline:  Goal status: ONGOING  4.  Pt will use verbal compensations for word finding in conversation as needed with rare min A Baseline:  Goal status: ONGOING  5.  Improve score on PROM Baseline: 24 Goal status: ONGOING ASSESSMENT:  CLINICAL IMPRESSION: Patient is a 53 y.o. female who was seen today for expressive language, slow processing, impaired selective and alternating attention. She reports having difficulty ordering at restaurants due to word finding impairment. She is unable to follow more than 1 direction or task at a time at work and has had her hours reduced. [name] reports difficulty managing her budget due to memory difficulties. She forgets her balance or forgets to save money for bills due later by over spending for food. I recommend skilled ST to maximize cognition for safety, financial management and communication. .   OBJECTIVE IMPAIRMENTS: include attention, memory, executive functioning, and expressive language. These impairments are limiting patient from ADLs/IADLs and effectively communicating at home and in community. Factors affecting  potential to achieve goals and functional outcome are co-morbidities.. Patient will benefit from skilled SLP services to address above impairments and improve overall function.  REHAB POTENTIAL: Good  PLAN:  SLP FREQUENCY: 1-2x/week  SLP DURATION: 12 weeks  PLANNED INTERVENTIONS: Language facilitation, Environmental controls, Cueing hierachy, Cognitive reorganization, Internal/external aids, Functional tasks, Multimodal communication approach, SLP instruction and feedback, Compensatory strategies, Patient/family education, [phone] Treatment of speech (30 or 45 min) , and [id number]- Speech Eval Sound Prod, Artic, Phon, Eval Compre, Express    Eshani Maestre, Leita Caldron, CCC-SLP 11/19/2023, 10:17 AM

## 2023-11-20 ENCOUNTER — Telehealth (INDEPENDENT_AMBULATORY_CARE_PROVIDER_SITE_OTHER): Payer: Self-pay | Admitting: Psychology

## 2023-11-20 ENCOUNTER — Encounter (INDEPENDENT_AMBULATORY_CARE_PROVIDER_SITE_OTHER): Admitting: Psychology

## 2023-11-20 ENCOUNTER — Ambulatory Visit (INDEPENDENT_AMBULATORY_CARE_PROVIDER_SITE_OTHER): Admitting: Licensed Clinical Social Worker

## 2023-11-20 ENCOUNTER — Encounter (INDEPENDENT_AMBULATORY_CARE_PROVIDER_SITE_OTHER): Payer: Self-pay

## 2023-11-20 DIAGNOSIS — F319 Bipolar disorder, unspecified: Secondary | ICD-10-CM

## 2023-11-20 DIAGNOSIS — F909 Attention-deficit hyperactivity disorder, unspecified type: Secondary | ICD-10-CM | POA: Diagnosis not present

## 2023-11-20 DIAGNOSIS — F900 Attention-deficit hyperactivity disorder, predominantly inattentive type: Secondary | ICD-10-CM

## 2023-11-20 NOTE — Progress Notes (Signed)
 Entered in error

## 2023-11-20 NOTE — Telephone Encounter (Signed)
  Office: [phone]  /  Fax: [phone]  Date of Encounter: November 20, 2023  Time of Encounter: 2:27pm Duration of Encounter: 7 minute(s) Provider: Wyatt Fire, PsyD  CONTENT: [name] presented for today's appointment via MyChart Video Visit. She stated she met with her provider at [facility] for therapeutic services, noting they will be meeting every two weeks. Chart review revealed the appointment was today. This provider expressed possible insurance coverage issues for two mental health appointments in one day. [name] acknowledged understanding and was receptive to rescheduling.  to A brief risk assessment was completed. [name] denied experiencing suicidal and homicidal ideation, plan, or intent since the last appointment with this provider. All questions/concerns addressed.   PLAN: [name] is scheduled for an appointment on 11/27/2023 at [facility] via MyChart Video Visit.

## 2023-11-20 NOTE — Progress Notes (Signed)
 [facility] Counselor/Therapist Progress Note  Patient ID: [name], MRN: [id number]    Date: 11/20/23  Time Spent: 901  am - 1000 am : 59 Minutes  Treatment Type: Individual Therapy.  Reported Symptoms: Patient reports a history of Bipolar Disorder, ADHD, Borderline Personality Disorder. Patient reports that she has an excess amount of credit cards and this is stressful due to her fixed income.   Mental Status Exam: Appearance:  Casual     Behavior: Appropriate  Motor: Normal  Speech/Language:  Clear and Coherent  Affect: Appropriate  Mood: normal  Thought process: normal  Thought content:   WNL  Sensory/Perceptual disturbances:   WNL  Orientation: oriented to person, place, time/date, situation, day of week, month of year, and year  Attention: Good  Concentration: Good  Memory: WNL  Fund of knowledge:  Good  Insight:   Good  Judgment:  Good  Impulse Control: Good   Risk Assessment: Danger to Self:  No Self-injurious Behavior: No Danger to Others: No Duty to Warn:no Physical Aggression / Violence:No  Access to Firearms a concern: No  Gang Involvement:No   Subjective:   [name] participated in person from office.  [name] consented to treatment. Therapist participated from office located at [facility] with patient.   [name] presented for her session reporting that she has been having a hard time. She reports that she has had 2 falls both at work. She reports that she has continued to work and has already received a mark up due to appointments. [name] reports that she has multiple appointments with both doctors and therapist. Patient reports that she was told by Dr. Sharron told her to come to see this writer because she feels that her issues with weight are more emotional. Patient reports having stress over her health and work. Patient also reports that her Mother has had multiple health issues.She reports that her mother is 50 and had a  back procedure and is wanting to be better now and is unable to understand that healing takes time. Patient has been helping her mother which has also affected her pain level due to having to help mother with mobility and getting in her chair.  Clinician actively listened and processed with patient her health concerns and ways to work on her daily stress related to both work and her health and her Mother's health. Patient states that her Mother is complaining about patient not keeping her room clean and helping with house chores. Clinician processed with patient the importance of keeping area clean and having order in the home. Clinician processed with patient ideas for getting in order and organization.  [name] was fully engaged in session and is motivated for treatment. She will continue to engage in CBT therapy. Patient is to use CBT, mindfulness and coping skills to help manage decrease symptoms associated with their diagnosis.Treatment planning to be reviewed by 09/11/2024.   Interventions: Cognitive Behavioral Therapy, Dialectical Behavioral Therapy, Assertiveness/Communication, Motivational Interviewing, and Solution-Oriented/Positive Psychology  Diagnosis: ADHD   Damien Junk MSW, LCSW/DATE 11/20/2023

## 2023-11-21 ENCOUNTER — Ambulatory Visit (INDEPENDENT_AMBULATORY_CARE_PROVIDER_SITE_OTHER): Admitting: Physician Assistant

## 2023-11-21 DIAGNOSIS — M25561 Pain in right knee: Secondary | ICD-10-CM | POA: Diagnosis not present

## 2023-11-21 DIAGNOSIS — Z124 Encounter for screening for malignant neoplasm of cervix: Secondary | ICD-10-CM | POA: Diagnosis not present

## 2023-11-21 DIAGNOSIS — Z0000 Encounter for general adult medical examination without abnormal findings: Secondary | ICD-10-CM | POA: Diagnosis not present

## 2023-11-21 DIAGNOSIS — R110 Nausea: Secondary | ICD-10-CM | POA: Diagnosis not present

## 2023-11-21 DIAGNOSIS — R1013 Epigastric pain: Secondary | ICD-10-CM | POA: Diagnosis not present

## 2023-11-21 DIAGNOSIS — I10 Essential (primary) hypertension: Secondary | ICD-10-CM | POA: Diagnosis not present

## 2023-11-21 DIAGNOSIS — Z13220 Encounter for screening for lipoid disorders: Secondary | ICD-10-CM | POA: Diagnosis not present

## 2023-11-25 NOTE — Progress Notes (Unsigned)
 SUBJECTIVE: Discussed the use of AI scribe software for clinical note transcription with the patient, who gave verbal consent to proceed.  Chief Complaint: Obesity  Interim History: She has maintained her weight since her last visit.    [name] is here to discuss her progress with her obesity treatment plan. She is on the Category 2 Plan and states she is following her eating plan approximately 50 % of the time. She states she is not exercising due to an injury.  [name] is a 53 year old female who presents for follow-up of her obesity treatment plan.  She is adhering to her obesity treatment plan approximately 50% of the time. She is not consistently meeting her protein needs and is not drinking enough water. She is not skipping meals and sleeps 7 to 9 hours per night. Her weight has remained stable since her last visit two months ago.  A recent work-related injury has affected her ability to exercise regularly. She fell onto her left side, injuring her hip, hand, shoulder, and knee. She reports she sustained a scaphoid fracture of her left hand. She reports an MRI of her knee showed a degenerative meniscus and arthritis in her knee. She is currently using a knee brace and awaiting results to determine if her hand fracture is healing.  She has been experiencing nausea for the past week and a half, which she attributes to frequent ibuprofen  use. This has made it difficult for her to eat her regular food. She was advised to start some Pepcid  to protect her stomach from NSAID treatment, but has not yet obtained the Pepcid . She does plan to get the medication today.   Her current diet includes a protein shake or cereal with grapefruit for breakfast, turkey wraps or salads with protein for lunch, and she struggles with dinner options due to fatigue from eating meat at lunch. She is exploring vegetarian options for dinner and trying to increase her protein intake with options like turkey  wraps, salads, and considering trying veggie burgers. OBJECTIVE: Visit Diagnoses: Problem List Items Addressed This Visit     Morbid obesity (HCC)   Insulin  resistance - Primary   Other hyperlipidemia   Vitamin D  deficiency   Other Visit Diagnoses       BMI 38.0-38.9,adult Current BMI 38.2         Obesity  Obesity with static weight since last visit. Compliance with category 2 obesity treatment plan is approximately 50%. Protein intake is inconsistent, and water intake is insufficient. No regular exercise due to work-related injury. - Encourage increased compliance with obesity treatment plan, especially with protein intake - Discuss dietary options including turkey wraps, protein shakes, and vegetarian options like veggie burgers and lentil soup as she would like alternatives to meat for dinners. Provided vegetarian plan and other protein alternatives to help improve protein intake at dinner.  - Encourage consistent protein intake of 25-30 grams per meal. - Schedule fasting metabolism test on December 1st as she has had ongoing limitations with mobility and suspect this may have impacted her metabolism significantly over the past year.    Insulin  Resistance Last fasting insulin  was 5.9- at goal. A1c was 5.1- at goal. Polyphagia:No Medication(s): None Lab Results  Component Value Date   HGBA1C 5.1 09/21/2023   HGBA1C 5.3 03/06/2023   HGBA1C 5.3 03/14/2022   HGBA1C 5.2 07/06/2021   HGBA1C 5.1 02/15/2021   Lab Results  Component Value Date   INSULIN  5.9 09/21/2023   INSULIN   7.3 03/06/2023   INSULIN  7.0 03/14/2022   INSULIN  6.0 07/06/2021   INSULIN  9.3 02/15/2021    Plan:  Continue working on nutrition plan to decrease simple carbohydrates, increase lean proteins and exercise to promote weight loss, improve glycemic control and prevent progression to Type 2 diabetes.  She has not been on medication like metformin and is on obesogenic medications . She has wanted to focus  on nutrition and exercise which is now very limited. May want to consider metformin for mild insulin  resistance.    Hyperlipidemia LDL is not at goal. Medication(s): Not on statin therapy  Cardiovascular risk factors: dyslipidemia, hypertension, obesity (BMI >= 30 kg/m2), and sedentary lifestyle  Lab Results  Component Value Date   CHOL 204 (H) 09/21/2023   HDL 50 09/21/2023   LDLCALC 136 (H) 09/21/2023   TRIG 100 09/21/2023   CHOLHDL 3.4 07/06/2021   CHOLHDL 3.7 12/29/2016   CHOLHDL 3.4 07/19/2016   Lab Results  Component Value Date   ALT 10 09/21/2023   AST 15 09/21/2023   ALKPHOS 86 09/21/2023   BILITOT 0.4 09/21/2023   The 10-year ASCVD risk score (Arnett DK, et al., 2019) is: 2.1%   Values used to calculate the score:     Age: 77 years     Clincally relevant sex: Female     Is Non-Hispanic African American: No     Diabetic: No     Tobacco smoker: No     Systolic Blood Pressure: 111 mmHg     Is BP treated: Yes     HDL Cholesterol: 50 mg/dL     Total Cholesterol: 204 mg/dL  Plan: Statin therapy not indicated by risk score currently.  Continue to work on nutrition plan -decreasing simple carbohydrates, increasing lean proteins, decreasing saturated fats and cholesterol , avoiding trans fats and exercise as able to promote weight loss, improve lipids and decrease cardiovascular risks.   Vitamin D  Deficiency Vitamin D  is at goal of 50.  Most recent vitamin D  level was 51.4. She is on OTC vitamin D3 5000 IU daily. No N/V or muscle weakness with OTC vitamin D .  Lab Results  Component Value Date   VD25OH 51.4 09/21/2023   VD25OH 53.2 03/06/2023   VD25OH 66.8 12/20/2021    Plan: Continue OTC vitamin D3 5000 IU daily Low vitamin D  levels can be associated with adiposity and may result in leptin resistance and weight gain. Also associated with fatigue.  Currently on vitamin D  supplementation without any adverse effects such as nausea, vomiting or muscle weakness.     RIght knee osteoarthritis Degenerative meniscus tear and knee osteoarthritis exacerbated by recent fall. MRI confirmed degenerative changes. Awaiting physical therapy evaluation on November 18th. - Start physical therapy for knee on November 18th. Continue to work on weight loss to off load knee.   ?Scaphoid fracture, hand Scaphoid fracture in the hand. Awaiting follow-up to assess healing status. - Follow up with hand specialist to assess fracture healing.   General Health Maintenance Discussed dietary options and protein intake to support overall health. - Encourage balanced diet with adequate protein intake.  Vitals Temp: 98.1 F (36.7 C) BP: 111/62 Pulse Rate: 65 SpO2: 99 %   Anthropometric Measurements Height: 5' 9 (1.753 m) Weight: 259 lb (117.5 kg) BMI (Calculated): 38.23 Weight at Last Visit: 259 lb Weight Lost Since Last Visit: 0 Weight Gained Since Last Visit: 0 Starting Weight: 270 lb Total Weight Loss (lbs): 11 lb (4.99 kg)   Body Composition  Body Fat %: 49.7 % Fat Mass (lbs): 128.8 lbs Muscle Mass (lbs): 123.6 lbs Total Body Water (lbs): 91.4 lbs Visceral Fat Rating : 14   No data recorded   ASSESSMENT AND PLAN:  Diet: [name] is currently in the action stage of change. As such, her goal is to get back to weight loss efforts. She has agreed to Category 2 Plan and Vegetarian Plan.  Exercise: [name] has been instructed to try a geriatric exercise plan and that some exercise is better than none for weight loss and overall health benefits. She reports she is going to start Physical therapy for her new knee injury.   Behavior Modification:  We discussed the following Behavioral Modification Strategies today: increasing lean protein intake, decreasing simple carbohydrates, increasing vegetables, increase H2O intake, increase high fiber foods, meal planning and cooking strategies, avoiding temptations, and planning for success. We discussed various  medication options to help [name] with her weight loss efforts and we both agreed to continue current treatment plan.  Return in about 5 weeks (around 12/31/2023) for Fasting IC.[name] She was informed of the importance of frequent follow up visits to maximize her success with intensive lifestyle modifications for her multiple health conditions.  Attestation Statements:   Reviewed by clinician on day of visit: allergies, medications, problem list, medical history, surgical history, family history, social history, and previous encounter notes.   Time spent on visit including pre-visit chart review and post-visit care and charting was 41 minutes.    Joyce Hypolite, PA-C

## 2023-11-26 ENCOUNTER — Encounter (INDEPENDENT_AMBULATORY_CARE_PROVIDER_SITE_OTHER): Payer: Self-pay | Admitting: Physician Assistant

## 2023-11-26 ENCOUNTER — Ambulatory Visit (INDEPENDENT_AMBULATORY_CARE_PROVIDER_SITE_OTHER): Admitting: Physician Assistant

## 2023-11-26 VITALS — BP 111/62 | HR 65 | Temp 98.1°F | Ht 69.0 in | Wt 259.0 lb

## 2023-11-26 DIAGNOSIS — E559 Vitamin D deficiency, unspecified: Secondary | ICD-10-CM | POA: Diagnosis not present

## 2023-11-26 DIAGNOSIS — E88819 Insulin resistance, unspecified: Secondary | ICD-10-CM | POA: Diagnosis not present

## 2023-11-26 DIAGNOSIS — E7849 Other hyperlipidemia: Secondary | ICD-10-CM

## 2023-11-26 DIAGNOSIS — M1711 Unilateral primary osteoarthritis, right knee: Secondary | ICD-10-CM

## 2023-11-26 DIAGNOSIS — Z6838 Body mass index (BMI) 38.0-38.9, adult: Secondary | ICD-10-CM

## 2023-11-26 DIAGNOSIS — M25561 Pain in right knee: Secondary | ICD-10-CM

## 2023-11-27 ENCOUNTER — Telehealth (INDEPENDENT_AMBULATORY_CARE_PROVIDER_SITE_OTHER): Admitting: Psychology

## 2023-11-28 ENCOUNTER — Encounter: Payer: Self-pay | Admitting: Speech Pathology

## 2023-11-28 ENCOUNTER — Ambulatory Visit: Payer: Self-pay | Admitting: Speech Pathology

## 2023-11-28 DIAGNOSIS — R41841 Cognitive communication deficit: Secondary | ICD-10-CM

## 2023-11-28 NOTE — Therapy (Signed)
 OUTPATIENT SPEECH LANGUAGE PATHOLOGY TREATMENT   Patient Name: [name] MRN: [id number] DOB:20-Jul-1970, 53 y.o., female Today's Date: 11/28/2023  PCP: None REFERRING PROVIDER: Dohmeier, Dedra, MD  END OF SESSION:  End of Session - 11/28/23 0935     Visit Number 3    Number of Visits 9    Date for Recertification  12/11/23    SLP Start Time 0930    SLP Stop Time  1015    SLP Time Calculation (min) 45 min    Activity Tolerance Patient tolerated treatment well          Past Medical History:  Diagnosis Date   Achilles tendinitis    ADHD (attention deficit hyperactivity disorder), inattentive type    Diagnosed as an adult after starting college   Allergy     Arthritis    Arthritis    Ataxia    Back pain    Bipolar I disorder 04/18/2014   most recent episode mixed   Borderline personality disorder 10/27/2011   Cataracts, bilateral    Chest pain    Chronic pain disorder    due to several injuries affecting numerous areas of her body throughout the years   Chronic paroxysmal hemicrania, 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   rt chest wall nodule removal   GANGLION CYST EXCISION  01/30/2009   lipoma removal     right bunioectomy     SHOULDER SURGERY  01/13/2011   right, partial tear   tumor resection left thigh     Patient Active Problem List   Diagnosis Date Noted   Postconcussion syndrome 10/02/2023   Impaired functional mobility, balance, gait, and endurance 03/08/2023   Unexplained falls 03/08/2023   BPV (benign positional vertigo), right 03/08/2023   Other Specified Feeding or Eating Disorder, Emotional and Binge Eating Behaviors 01/15/2023   Pain in joint of right knee 01/15/2023   Other fatigue 09/24/2022   BMI 37.0-37.9, adult 03/14/2022   Shoulder subluxation, left, initial encounter 02/01/2022   Environmental allergies 01/31/2022   Class 3 severe obesity with serious comorbidity and body mass index (BMI) of 40.0 to 44.9 in adult (HCC) 01/31/2022   Bipolar disorder (HCC) 11/28/2021   Arthritis of first metatarsophalangeal (MTP) joint of right foot 10/24/2021   Migraines 08/18/2021  Cervical radiculopathy 08/16/2021   Lipoma of left forearm 08/16/2021   Carpal tunnel syndrome on left 08/16/2021   Vitamin D  deficiency 07/26/2021   Eating disorder 07/26/2021   Insulin  resistance 07/06/2021   Other hyperlipidemia 07/06/2021   B12 deficiency 07/06/2021   Foot sprain, left, initial encounter 06/01/2021   Mood disorder 04/29/2021   Sleep difficulties 04/29/2021   Other constipation 04/29/2021   Essential hypertension 04/29/2021   Sprain of ankle 03/29/2021   Subacromial bursitis of left shoulder joint 03/29/2021   Concussion with no loss of consciousness 02/07/2021   Lumbar radiculopathy 02/07/2021   Shoulder impingement syndrome, left 11/01/2020   Sprain of metacarpophalangeal joint of  left thumb 10/07/2020   Posterior tibial tendinitis of right lower extremity 09/13/2020   Labral tear of hip, degenerative 09/13/2020   Attention and concentration deficit 05/07/2020   Morbid obesity (HCC) 05/07/2020   Plantar wart 05/07/2020   Sleep apnea 05/07/2020   Urinary incontinence 05/07/2020   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   Genetic testing 02/27/2018   Family history of genetic disease carrier    Family history of breast cancer    Family history of colon cancer    History of multiple concussions 11/20/2017   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 11/20/2017   Cervical strain, acute, initial encounter 09/19/2016   Insomnia 02/17/2015   Right shoulder pain 12/31/2014   Episodic cluster headache, not intractable 12/02/2014   Chronic paroxysmal hemicrania, 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   Photophobia of both eyes 05/04/2014   Nausea with vomiting 05/04/2014   Bipolar I disorder, most recent episode mixed (HCC) 04/18/2014   Suicidal ideation 04/12/2014   Injury of right shoulder and upper arm 02/17/2014   Migraine with status migrainosus 01/08/2013   Chronic migraine 05/08/2012   Hypertension    Contact dermatitis 11/27/2011   Generalized anxiety disorder 10/27/2011   ADHD (attention deficit hyperactivity disorder), 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   Borderline personality disorder (HCC) 10/27/2011   Right foot pain 09/28/2011   Loss of transverse plantar arch 09/01/2011   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 female with h/o bipolar, HTN, migraines, anxiety, and four previous concussions presents to the ER 07/15/23 for evaluation after head injury. Around 1800 yesterday, the patient reports that she was bending down when she hit the left side of her head on a purse hook. Denies any LOC. No blood thinner use.  She denies any LOC, syncope, nausea, vomiting.  She is able to work the rest of her shift and took some Tylenol  when she got home.  She reports she woke up this morning with still having some pressure to the left side of her head and around her eyes but no visual changes.  No blurry vision, diplopia, eye pain.  She is concerned that she may have given as of another concussion due to her past medical history of them.  PAIN:  Are you having pain? Yes: NPRS scale: 7/10 Pain location: shoulder left, right shoulder, right knee  Pain description:  ache Aggravating factors: exercise Relieving factors: rest, shot  FALLS: Has patient fallen in last 6 months?  See PT evaluation for details  LIVING ENVIRONMENT: Lives with: lives with their family Lives in: House/apartment  PLOF:  Level of assistance: Independent with ADLs, Needed assistance with IADLS Employment: Part-time employment, On disability  PATIENT GOALS: I overwhelmed and I can't find my words  OBJECTIVE:   Note: Objective measures were completed at Evaluation unless otherwise noted.   COGNITION: Overall cognitive status: Impaired Areas of impairment:  Attention: Impaired: Alternating, Divided Memory: Impaired: Auditory Visual Executive function: Impaired: Organization and Slow processing Functional deficits: difficulty processing and recalling more than 1 task at a time at work                                                                                                                             TREATMENT DATE:   11/28/23: Holley went to the wrong place for appointment. This week she has  carried over more consistent use of planner - ensuring it is open to the correct week each Sunday. Targeted monthly budget to plan for bills and food/entertainment amounts to plan for funds to pay bills with rare min A she generated list of monthly bills, amounts, due dates and credit cards and estimated amount spend on groceries and eating out with rare min questioning cues. Trained verbal compensations for word finding in structured task generating 3-4 salient descriptions of mid and low frequency objects with rare min questioning cues.   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 [name] continues to report word finding difficulty especially when flustered - we generated strategy of self advocating - letting her supervisor and co-workers know that she is overwhelmed and having trouble finding her words. 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 With rare min A, she named and wrote 15 - with semantic cues, she named 5 more for a total of 20. [name] reports she is using a planner, and keeping it in the same spot and usually remembers to turn to the new week on Sunday, however she forgets to write down appointments occasionally. Strategy of keeping her planner on top of her piles on her table as visual cues. Moderately complex word finding with given letter required usual mod semantic and fill in the blank cues to name 12 words.   09/18/23: Eval completed - initiated strategies to support processing of instructions at work including self advocating, repeating back what she has heard and asking for directions in writing to refer back to. Initiated training in verbal compensations for word finding generating description and salient words of  target word with usual mod questioning cues  after initial instruction and modeling.     PATIENT EDUCATION: Education details: verbal compensations for word finding, compensatory strategies to support processing and memory Person educated: Patient Education method: Explanation, Demonstration, and Verbal cues Education comprehension: verbal cues required and needs further education   GOALS: Goals reviewed with patient? Yes  SHORT TERM GOALS: Target date: 11/30/23  Pt will name 18 items in personally relevant category with  Baseline: Goal status: ONGOING  2.  Pt will generate monthly budget to have enough money to pay bills Baseline:  Goal status:ONGOING  3.  Pt will carryover 2 external aids to recall 2-3 tasks at work with rare min A Baseline:  Goal status: ONGOING  4.  Pt will carryover 2 compensatory strategies to support processing speed with rare min A Baseline:  Goal status: ONGOING  5.  Pt will employ verbal compensations for aphasia in structured naming task with occasional min A 5/7 opportunities Baseline:  Goal status: ONGOING  LONG TERM GOALS: Target date: 12/11/23  Pt will complete complex naming tasks with 90% accuracy and occasional min A Baseline:  Goal status: ONGOING  2.  Pt will use external aids to monitor discretionary spending weekly to maintain her budget for bills Baseline:  Goal status: ONGOING  3.  Pt will generate script to self advocate for support for processing speech with rare min A Baseline:  Goal status: ONGOING  4.  Pt will use verbal compensations for word finding in conversation as needed with rare min A Baseline:  Goal status: ONGOING  5.  Improve score on PROM Baseline: 24 Goal status: ONGOING ASSESSMENT:  CLINICAL IMPRESSION: Patient is a 53 y.o. female who was seen today for expressive language, slow processing, impaired selective and alternating attention. She reports having difficulty ordering at restaurants due to  word finding impairment. She is unable to follow more than 1 direction or task at a time at work and has had her hours reduced. [name] reports difficulty managing her budget due to memory difficulties. She forgets her balance or forgets to save money for bills due later by over spending for food. I recommend skilled ST to maximize cognition for safety, financial management and communication. .   OBJECTIVE IMPAIRMENTS: include attention, memory, executive functioning, and expressive language. These impairments are limiting patient from ADLs/IADLs and effectively communicating at home and in community. Factors affecting potential to achieve goals and functional outcome are co-morbidities.. Patient will benefit from skilled SLP services to address above impairments and improve overall function.  REHAB POTENTIAL: Good  PLAN:  SLP FREQUENCY: 1-2x/week  SLP DURATION: 12 weeks  PLANNED INTERVENTIONS: Language facilitation, Environmental controls, Cueing hierachy, Cognitive reorganization, Internal/external aids, Functional tasks, Multimodal communication approach, SLP instruction and feedback, Compensatory strategies, Patient/family education, [phone] Treatment of speech (30 or 45 min) , and [id number]- Speech Eval Sound Prod, Artic, Phon, Eval Compre, Express    Lanae Federer, Leita Caldron, CCC-SLP 11/28/2023, 10:12 AM

## 2023-12-03 ENCOUNTER — Encounter: Payer: Self-pay | Admitting: Speech Pathology

## 2023-12-03 ENCOUNTER — Ambulatory Visit: Attending: Neurology | Admitting: Speech Pathology

## 2023-12-03 DIAGNOSIS — R41841 Cognitive communication deficit: Secondary | ICD-10-CM | POA: Insufficient documentation

## 2023-12-03 NOTE — Therapy (Signed)
 OUTPATIENT SPEECH LANGUAGE PATHOLOGY TREATMENT   Patient Name: [name] MRN: [id number] DOB:Apr 05, 1970, 53 y.o., female Today's Date: 12/03/2023  PCP: None REFERRING PROVIDER: Dohmeier, Dedra, MD  END OF SESSION:  End of Session - 12/03/23 1238     Visit Number 4    Number of Visits 9    Date for Recertification  12/11/23    SLP Start Time 1233    SLP Stop Time  1315    SLP Time Calculation (min) 42 min    Activity Tolerance Patient tolerated treatment well          Past Medical History:  Diagnosis Date   Achilles tendinitis    ADHD (attention deficit hyperactivity disorder), inattentive type    Diagnosed as an adult after starting college   Allergy     Arthritis    Arthritis    Ataxia    Back pain    Bipolar I disorder 04/18/2014   most recent episode mixed   Borderline personality disorder 10/27/2011   Cataracts, bilateral    Chest pain    Chronic pain disorder    due to several injuries affecting numerous areas of her body throughout the years   Chronic paroxysmal hemicrania, 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   rt chest wall nodule removal   GANGLION CYST EXCISION  01/30/2009   lipoma removal     right bunioectomy     SHOULDER SURGERY  01/13/2011   right, partial tear   tumor resection left thigh     Patient Active Problem List   Diagnosis Date Noted   Postconcussion syndrome 10/02/2023   Impaired functional mobility, balance, gait, and endurance 03/08/2023   Unexplained falls 03/08/2023   BPV (benign positional vertigo), right 03/08/2023   Other Specified Feeding or Eating Disorder, Emotional and Binge Eating Behaviors 01/15/2023   Pain in joint of right knee 01/15/2023   Other fatigue 09/24/2022   BMI 37.0-37.9, adult 03/14/2022   Shoulder subluxation, left, initial encounter 02/01/2022   Environmental allergies 01/31/2022   Class 3 severe obesity with serious comorbidity and body mass index (BMI) of 40.0 to 44.9 in adult (HCC) 01/31/2022   Bipolar disorder (HCC) 11/28/2021   Arthritis of first metatarsophalangeal (MTP) joint of right foot 10/24/2021   Migraines 08/18/2021  Cervical radiculopathy 08/16/2021   Lipoma of left forearm 08/16/2021   Carpal tunnel syndrome on left 08/16/2021   Vitamin D  deficiency 07/26/2021   Eating disorder 07/26/2021   Insulin  resistance 07/06/2021   Other hyperlipidemia 07/06/2021   B12 deficiency 07/06/2021   Foot sprain, left, initial encounter 06/01/2021   Mood disorder 04/29/2021   Sleep difficulties 04/29/2021   Other constipation 04/29/2021   Essential hypertension 04/29/2021   Sprain of ankle 03/29/2021   Subacromial bursitis of left shoulder joint 03/29/2021   Concussion with no loss of consciousness 02/07/2021   Lumbar radiculopathy 02/07/2021   Shoulder impingement syndrome, left 11/01/2020   Sprain of metacarpophalangeal joint of  left thumb 10/07/2020   Posterior tibial tendinitis of right lower extremity 09/13/2020   Labral tear of hip, degenerative 09/13/2020   Attention and concentration deficit 05/07/2020   Morbid obesity (HCC) 05/07/2020   Plantar wart 05/07/2020   Sleep apnea 05/07/2020   Urinary incontinence 05/07/2020   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   Genetic testing 02/27/2018   Family history of genetic disease carrier    Family history of breast cancer    Family history of colon cancer    History of multiple concussions 11/20/2017   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 11/20/2017   Cervical strain, acute, initial encounter 09/19/2016   Insomnia 02/17/2015   Right shoulder pain 12/31/2014   Episodic cluster headache, not intractable 12/02/2014   Chronic paroxysmal hemicrania, 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   Photophobia of both eyes 05/04/2014   Nausea with vomiting 05/04/2014   Bipolar I disorder, most recent episode mixed (HCC) 04/18/2014   Suicidal ideation 04/12/2014   Injury of right shoulder and upper arm 02/17/2014   Migraine with status migrainosus 01/08/2013   Chronic migraine 05/08/2012   Hypertension    Contact dermatitis 11/27/2011   Generalized anxiety disorder 10/27/2011   ADHD (attention deficit hyperactivity disorder), 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 female with h/o bipolar, HTN, migraines, anxiety, and four previous concussions presents to the ER 07/15/23 for evaluation after head injury. Around 1800 yesterday, the patient reports that she was bending down when she hit the left side of her head on a purse hook. Denies any LOC. No blood thinner use.  She denies any LOC, syncope, nausea, vomiting.  She is able to work the rest of her shift and took some Tylenol  when she got home.  She reports she woke up this morning with still having some pressure to the left side of her head and around her eyes but no visual changes.  No blurry vision, diplopia, eye pain.  She is concerned that she may have given as of another concussion due to her past medical history of them.  PAIN:  Are you having pain? Yes: NPRS scale: 8/10 Pain location: shoulder left, right shoulder, right knee  Left hand  Pain  description: ache Aggravating factors: exercise Relieving factors: rest, shot  FALLS: Has patient fallen in last 6 months?  See PT evaluation for details  LIVING ENVIRONMENT: Lives with: lives with their family Lives in: House/apartment  PLOF:  Level of assistance: Independent with ADLs, Needed assistance with IADLS Employment: Part-time employment, On disability  PATIENT GOALS: I overwhelmed and I can't find my words  OBJECTIVE:   Note: Objective measures were completed at Evaluation unless otherwise noted.   COGNITION: Overall cognitive status: Impaired Areas of impairment:  Attention: Impaired: Alternating, Divided Memory: Impaired: Auditory Visual Executive function: Impaired: Organization and Slow processing Functional deficits: difficulty processing and recalling more than 1 task at a time at work                                                                                                                             TREATMENT DATE:   12/03/23: [name] has carried over use of planner - she turned the page to the new  week yesterday - We generated strategy of writing a note and placing it under a clear mat at her work space and throwing it out after tasks are completed to aid in recall of multiple instructions from her supervisor as well as asking her supervisor to write down more complex instructions. She relied on co-worker to help her recall complex instructions last week. Targeted complex word finding with given letter and mid to low frequency categories with rare semantic  min A - she named 21/24 items She has continued to use notebook to keep track of and pay bills on time. Targeted verbal compensations in structured tasks - [name] required occasional min questioning cues and instruction to use most salient descriptions.   11/28/23: [name] went to the wrong place for appointment. This week she has carried over more consistent use of planner - ensuring it is open to the correct week each Sunday. Targeted monthly budget to plan for bills and food/entertainment amounts to plan for funds to pay bills with rare min A she generated list of monthly bills, amounts, due dates and credit cards and estimated amount spend on groceries and eating out with rare min questioning cues. Trained verbal compensations for word finding in structured task generating 3-4 salient descriptions of mid and low frequency objects with rare min questioning cues.   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 [name] continues to report word finding difficulty especially when flustered - we generated strategy of self advocating - letting her supervisor and co-workers know that she is overwhelmed and having trouble finding her words. 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 With rare  min A, she named  and wrote 15 - with semantic cues, she named 5 more for a total of 20. [name] reports she is using a planner, and keeping it in the same spot and usually remembers to turn to the new week on Sunday, however she forgets to write down appointments occasionally. Strategy of keeping her planner on top of her piles on her table as visual cues. Moderately complex word finding with given letter required usual mod semantic and fill in the blank cues to name 12 words.   09/18/23: Eval completed - initiated strategies to support processing of instructions at work including self advocating, repeating back what she has heard and asking for directions in writing to refer back to. Initiated training in verbal compensations for word finding generating description and salient words of target word with usual mod questioning cues after initial instruction and modeling.     PATIENT EDUCATION: Education details: verbal compensations for word finding, compensatory strategies to support processing and memory Person educated: Patient Education method: Explanation, Demonstration, and Verbal cues Education comprehension: verbal cues required and needs further education   GOALS: Goals reviewed with patient? Yes  SHORT TERM GOALS: Target date: 11/30/23  Pt will name 18 items in personally relevant category with  Baseline: Goal status: ONGOING  2.  Pt will generate monthly budget to have enough money to pay bills Baseline:  Goal status:MET  3.  Pt will carryover 2 external aids to recall 2-3 tasks at work with rare min A Baseline:  Goal status: ONGOING  4.  Pt will carryover 2 compensatory strategies to support processing speed with rare min A Baseline:  Goal status: ONGOING  5.  Pt will employ verbal compensations for aphasia in structured naming task with occasional min A 5/7 opportunities Baseline:  Goal status: ONGOING  LONG TERM GOALS: Target date: 12/11/23  Pt will complete complex  naming tasks with 90% accuracy and occasional min A Baseline:  Goal status: ONGOING  2.  Pt will use external aids to monitor discretionary spending weekly to maintain her budget for bills Baseline:  Goal status: ONGOING  3.  Pt will generate script to self advocate for support for processing speech with rare min A Baseline:  Goal status: ONGOING  4.  Pt will use verbal compensations for word finding in conversation as needed with rare min A Baseline:  Goal status: ONGOING  5.  Improve score on PROM Baseline: 24 Goal status: ONGOING ASSESSMENT:  CLINICAL IMPRESSION: Patient is a 53 y.o. female who was seen today for expressive language, slow processing, impaired selective and alternating attention. She reports having difficulty ordering at restaurants due to word finding impairment. She is unable to follow more than 1 direction or task at a time at work and has had her hours reduced. [name] reports difficulty managing her budget due to memory difficulties. She forgets her balance or forgets to save money for bills due later by over spending for food. I recommend skilled ST to maximize cognition for safety, financial management and communication. .   OBJECTIVE IMPAIRMENTS: include attention, memory, executive functioning, and expressive language. These impairments are limiting patient from ADLs/IADLs and effectively communicating at home and in community. Factors affecting potential to achieve goals and functional outcome are co-morbidities.. Patient will benefit from skilled SLP services to address above impairments and improve overall function.  REHAB POTENTIAL: Good  PLAN:  SLP FREQUENCY: 1-2x/week  SLP DURATION: 12 weeks  PLANNED INTERVENTIONS: Language facilitation, Environmental controls, Cueing hierachy, Cognitive reorganization, Internal/external aids, Functional tasks,  Multimodal communication approach, SLP instruction and feedback, Compensatory strategies, Patient/family  education, [phone] Treatment of speech (30 or 45 min) , and [id number]- Speech [address] [place], Phon, Eval Compre, Express    Azharia Surratt, Leita Caldron, CCC-SLP 12/03/2023, 1:16 PM

## 2023-12-04 ENCOUNTER — Ambulatory Visit: Admitting: Licensed Clinical Social Worker

## 2023-12-04 DIAGNOSIS — F3160 Bipolar disorder, current episode mixed, unspecified: Secondary | ICD-10-CM

## 2023-12-04 DIAGNOSIS — F900 Attention-deficit hyperactivity disorder, predominantly inattentive type: Secondary | ICD-10-CM | POA: Diagnosis not present

## 2023-12-04 DIAGNOSIS — F603 Borderline personality disorder: Secondary | ICD-10-CM
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
## 2023-12-05 ENCOUNTER — Encounter: Payer: Self-pay | Admitting: Speech Pathology

## 2023-12-05 ENCOUNTER — Ambulatory Visit: Admitting: Speech Pathology

## 2023-12-05 DIAGNOSIS — R41841 Cognitive communication deficit: Secondary | ICD-10-CM | POA: Diagnosis not present

## 2023-12-05 NOTE — Therapy (Signed)
 OUTPATIENT SPEECH LANGUAGE PATHOLOGY TREATMENT   Patient Name: [name] MRN: [id number] DOB:07/29/1970, 53 y.o., female Today's Date: 12/05/2023  PCP: None REFERRING PROVIDER: Dohmeier, Dedra, MD  END OF SESSION:  End of Session - 12/05/23 1323     Visit Number 5    Number of Visits 9    Date for Recertification  12/11/23    SLP Start Time 1318    SLP Stop Time  1400    SLP Time Calculation (min) 42 min    Activity Tolerance Patient tolerated treatment well          Past Medical History:  Diagnosis Date   Achilles tendinitis    ADHD (attention deficit hyperactivity disorder), inattentive type    Diagnosed as an adult after starting college   Allergy     Arthritis    Arthritis    Ataxia    Back pain    Bipolar I disorder 04/18/2014   most recent episode mixed   Borderline personality disorder 10/27/2011   Cataracts, bilateral    Chest pain    Chronic pain disorder    due to several injuries affecting numerous areas of her body throughout the years   Chronic paroxysmal hemicrania, 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   rt chest wall nodule removal   GANGLION CYST EXCISION  01/30/2009   lipoma removal     right bunioectomy     SHOULDER SURGERY  01/13/2011   right, partial tear   tumor resection left thigh     Patient Active Problem List   Diagnosis Date Noted   Postconcussion syndrome 10/02/2023   Impaired functional mobility, balance, gait, and endurance 03/08/2023   Unexplained falls 03/08/2023   BPV (benign positional vertigo), right 03/08/2023   Other Specified Feeding or Eating Disorder, Emotional and Binge Eating Behaviors 01/15/2023   Pain in joint of right knee 01/15/2023   Other fatigue 09/24/2022   BMI 37.0-37.9, adult 03/14/2022   Shoulder subluxation, left, initial encounter 02/01/2022   Environmental allergies 01/31/2022   Class 3 severe obesity with serious comorbidity and body mass index (BMI) of 40.0 to 44.9 in adult (HCC) 01/31/2022   Bipolar disorder (HCC) 11/28/2021   Arthritis of first metatarsophalangeal (MTP) joint of right foot 10/24/2021   Migraines 08/18/2021  Cervical radiculopathy 08/16/2021   Lipoma of left forearm 08/16/2021   Carpal tunnel syndrome on left 08/16/2021   Vitamin D  deficiency 07/26/2021   Eating disorder 07/26/2021   Insulin  resistance 07/06/2021   Other hyperlipidemia 07/06/2021   B12 deficiency 07/06/2021   Foot sprain, left, initial encounter 06/01/2021   Mood disorder 04/29/2021   Sleep difficulties 04/29/2021   Other constipation 04/29/2021   Essential hypertension 04/29/2021   Sprain of ankle 03/29/2021   Subacromial bursitis of left shoulder joint 03/29/2021   Concussion with no loss of consciousness 02/07/2021   Lumbar radiculopathy 02/07/2021   Shoulder impingement syndrome, left 11/01/2020   Sprain of metacarpophalangeal joint of  left thumb 10/07/2020   Posterior tibial tendinitis of right lower extremity 09/13/2020   Labral tear of hip, degenerative 09/13/2020   Attention and concentration deficit 05/07/2020   Morbid obesity (HCC) 05/07/2020   Plantar wart 05/07/2020   Sleep apnea 05/07/2020   Urinary incontinence 05/07/2020   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   Genetic testing 02/27/2018   Family history of genetic disease carrier    Family history of breast cancer    Family history of colon cancer    History of multiple concussions 11/20/2017   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 11/20/2017   Cervical strain, acute, initial encounter 09/19/2016   Insomnia 02/17/2015   Right shoulder pain 12/31/2014   Episodic cluster headache, not intractable 12/02/2014   Chronic paroxysmal hemicrania, 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   Photophobia of both eyes 05/04/2014   Nausea with vomiting 05/04/2014   Bipolar I disorder, most recent episode mixed (HCC) 04/18/2014   Suicidal ideation 04/12/2014   Injury of right shoulder and upper arm 02/17/2014   Migraine with status migrainosus 01/08/2013   Chronic migraine 05/08/2012   Hypertension    Contact dermatitis 11/27/2011   Generalized anxiety disorder 10/27/2011   ADHD (attention deficit hyperactivity disorder), 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 female with h/o bipolar, HTN, migraines, anxiety, and four previous concussions presents to the ER 07/15/23 for evaluation after head injury. Around 1800 yesterday, the patient reports that she was bending down when she hit the left side of her head on a purse hook. Denies any LOC. No blood thinner use.  She denies any LOC, syncope, nausea, vomiting.  She is able to work the rest of her shift and took some Tylenol  when she got home.  She reports she woke up this morning with still having some pressure to the left side of her head and around her eyes but no visual changes.  No blurry vision, diplopia, eye pain.  She is concerned that she may have given as of another concussion due to her past medical history of them.  PAIN:  Are you having pain? Yes: NPRS scale: 8/10 Pain location: shoulder left, right shoulder, right knee  Left hand  Pain  description: ache Aggravating factors: exercise Relieving factors: rest, shot  FALLS: Has patient fallen in last 6 months?  See PT evaluation for details  LIVING ENVIRONMENT: Lives with: lives with their family Lives in: House/apartment  PLOF:  Level of assistance: Independent with ADLs, Needed assistance with IADLS Employment: Part-time employment, On disability  PATIENT GOALS: I overwhelmed and I can't find my words  OBJECTIVE:   Note: Objective measures were completed at Evaluation unless otherwise noted.   COGNITION: Overall cognitive status: Impaired Areas of impairment:  Attention: Impaired: Alternating, Divided Memory: Impaired: Auditory Visual Executive function: Impaired: Organization and Slow processing Functional deficits: difficulty processing and recalling more than 1 task at a time at work                                                                                                                             TREATMENT DATE:   12/05/23: Holley verbalized carryover of using receipt paper and a pen in her  pocket to write down multi step instructions at work. Training in compensations for processing, including using mindful breathing and reducing background noise, asking for person to talk slower or repeat what was said, and stress management. 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 [name] required occasional min verbal cues to stay on task while conversing.  12/03/23: [name] has carried over use of planner - she turned the page to the new week yesterday - We generated strategy of writing a note and placing it under a clear mat at her work space and throwing it out after tasks are completed to aid in recall of multiple instructions from her supervisor as well as asking her supervisor to write down more complex instructions. She relied on co-worker to help her recall complex instructions last week. Targeted complex word finding with given letter and mid to low frequency categories with rare semantic  min A - she named 21/24 items She has continued to use notebook to keep track of and pay bills on time. Targeted verbal compensations in structured tasks - [name] required occasional min questioning cues and instruction to use most salient descriptions.   11/28/23: [name] went to the wrong place for appointment. This week she has carried over more consistent use of planner - ensuring it is open to the correct week each Sunday. Targeted monthly budget to plan for bills and food/entertainment amounts to plan for funds to pay bills with rare min A she generated list of monthly bills, amounts, due dates and credit cards and estimated amount spend on groceries and eating out with rare min questioning cues. Trained verbal compensations for word finding in structured task generating 3-4 salient descriptions of mid and low frequency objects with rare min questioning cues.   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 [name] continues to report word finding difficulty especially when flustered - we generated strategy of self advocating - letting her supervisor and co-workers know that she is overwhelmed and having trouble finding her words. 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 With rare min A, she named and wrote 15 - with semantic cues, she named 5 more for a total of 20. [name] reports she is using a planner, and keeping it in the same spot and usually remembers to turn to the new week on Sunday, however she forgets to write down appointments occasionally. Strategy of keeping her planner on top of her piles on her table as visual cues. Moderately complex word finding with given letter required usual mod semantic and fill in the blank cues to name 12 words.   09/18/23: Eval completed - initiated strategies to support processing of instructions at work including self advocating, repeating back what she has heard and asking for directions in writing to refer back to. Initiated training in verbal compensations for word finding generating description and salient words of target word with usual mod questioning cues after initial instruction and modeling.     PATIENT EDUCATION: Education details: verbal compensations for word finding, compensatory strategies to support processing and memory Person educated: Patient Education method: Explanation, Demonstration, and Verbal cues Education comprehension: verbal cues required and needs further education   GOALS: Goals reviewed with patient? Yes  SHORT TERM GOALS: Target date: 11/30/23  Pt will name 18 items in personally relevant category with  Baseline: Goal status: ONGOING  2.  Pt will generate  monthly budget to have enough money to pay bills Baseline:  Goal status:MET  3.  Pt will carryover 2 external aids to recall 2-3 tasks at work with rare min A Baseline:  Goal status: MET  4.  Pt will carryover 2 compensatory strategies to support processing speed with rare min A Baseline:  Goal status: ONGOING  5.  Pt will employ verbal compensations for aphasia in structured naming task with occasional min A 5/7 opportunities Baseline:  Goal status: ONGOING  LONG TERM GOALS: Target date: 12/11/23  Pt will complete complex naming tasks with 90% accuracy and occasional min A Baseline:  Goal status: ONGOING  2.  Pt will use external aids to monitor discretionary spending weekly to maintain her budget for bills Baseline:  Goal status: ONGOING  3.  Pt will generate script to self advocate for support for processing speech with rare min A Baseline:  Goal status: ONGOING  4.  Pt will use verbal compensations for word finding in conversation as needed with rare min A Baseline:  Goal status: ONGOING  5.  Improve score on PROM Baseline: 24 Goal status: ONGOING ASSESSMENT:  CLINICAL IMPRESSION: Patient is a 53 y.o. female who was seen today for expressive language, slow processing, impaired selective and alternating attention. She reports having difficulty ordering at restaurants due to word finding impairment. She is unable to follow more than 1 direction or task at a time at work and has had her hours reduced. [name] reports difficulty managing her budget due to memory difficulties. She forgets her balance or forgets to save money for bills due later by over spending for food. I recommend skilled ST to maximize cognition for safety, financial management  and communication. .   OBJECTIVE IMPAIRMENTS: include attention, memory, executive functioning, and expressive language. These impairments are limiting patient from ADLs/IADLs and effectively communicating at home and in  community. Factors affecting potential to achieve goals and functional outcome are co-morbidities.. Patient will benefit from skilled SLP services to address above impairments and improve overall function.  REHAB POTENTIAL: Good  PLAN:  SLP FREQUENCY: 1-2x/week  SLP DURATION: 12 weeks  PLANNED INTERVENTIONS: Language facilitation, Environmental controls, Cueing hierachy, Cognitive reorganization, Internal/external aids, Functional tasks, Multimodal communication approach, SLP instruction and feedback, Compensatory strategies, Patient/family education, [phone] Treatment of speech (30 or 45 min) , and [id number]- Speech Eval Sound Prod, Artic, Phon, Eval Compre, Express    Luane Rochon, Leita Caldron, CCC-SLP 12/05/2023, 1:51 PM

## 2023-12-09 DIAGNOSIS — Z1212 Encounter for screening for malignant neoplasm of rectum: Secondary | ICD-10-CM | POA: Diagnosis not present

## 2023-12-10 ENCOUNTER — Encounter: Payer: Self-pay | Admitting: Speech Pathology

## 2023-12-10 ENCOUNTER — Ambulatory Visit: Admitting: Speech Pathology

## 2023-12-10 DIAGNOSIS — R41841 Cognitive communication deficit: Secondary | ICD-10-CM

## 2023-12-10 NOTE — Therapy (Signed)
 OUTPATIENT SPEECH LANGUAGE PATHOLOGY TREATMENT   Patient Name: [name] MRN: [id number] DOB:Aug 22, 1970, 53 y.o., female Today's Date: 12/10/2023  PCP: None REFERRING PROVIDER: Dohmeier, Dedra, MD  END OF SESSION:  End of Session - 12/10/23 1315     Visit Number 6    Number of Visits 9    Date for Recertification  12/11/23    SLP Start Time 1233    SLP Stop Time  1316    SLP Time Calculation (min) 43 min    Activity Tolerance Patient tolerated treatment well           Past Medical History:  Diagnosis Date   Achilles tendinitis    ADHD (attention deficit hyperactivity disorder), inattentive type    Diagnosed as an adult after starting college   Allergy     Arthritis    Arthritis    Ataxia    Back pain    Bipolar I disorder 04/18/2014   most recent episode mixed   Borderline personality disorder 10/27/2011   Cataracts, bilateral    Chest pain    Chronic pain disorder    due to several injuries affecting numerous areas of her body throughout the years   Chronic paroxysmal hemicrania, 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   rt chest wall nodule removal   GANGLION CYST EXCISION  01/30/2009   lipoma removal     right bunioectomy     SHOULDER SURGERY  01/13/2011   right, partial tear   tumor resection left thigh     Patient Active Problem List   Diagnosis Date Noted   Postconcussion syndrome 10/02/2023   Impaired functional mobility, balance, gait, and endurance 03/08/2023   Unexplained falls 03/08/2023   BPV (benign positional vertigo), right 03/08/2023   Other Specified Feeding or Eating Disorder, Emotional and Binge Eating Behaviors 01/15/2023   Pain in joint of right knee 01/15/2023   Other fatigue 09/24/2022   BMI 37.0-37.9, adult 03/14/2022   Shoulder subluxation, left, initial encounter 02/01/2022   Environmental allergies 01/31/2022   Class 3 severe obesity with serious comorbidity and body mass index (BMI) of 40.0 to 44.9 in adult (HCC) 01/31/2022   Bipolar disorder (HCC) 11/28/2021   Arthritis of first metatarsophalangeal (MTP) joint of right foot 10/24/2021   Migraines 08/18/2021  Cervical radiculopathy 08/16/2021   Lipoma of left forearm 08/16/2021   Carpal tunnel syndrome on left 08/16/2021   Vitamin D  deficiency 07/26/2021   Eating disorder 07/26/2021   Insulin  resistance 07/06/2021   Other hyperlipidemia 07/06/2021   B12 deficiency 07/06/2021   Foot sprain, left, initial encounter 06/01/2021   Mood disorder 04/29/2021   Sleep difficulties 04/29/2021   Other constipation 04/29/2021   Essential hypertension 04/29/2021   Sprain of ankle 03/29/2021   Subacromial bursitis of left shoulder joint 03/29/2021   Concussion with no loss of consciousness 02/07/2021   Lumbar radiculopathy 02/07/2021   Shoulder impingement syndrome, left 11/01/2020   Sprain of metacarpophalangeal  joint of left thumb 10/07/2020   Posterior tibial tendinitis of right lower extremity 09/13/2020   Labral tear of hip, degenerative 09/13/2020   Attention and concentration deficit 05/07/2020   Morbid obesity (HCC) 05/07/2020   Plantar wart 05/07/2020   Sleep apnea 05/07/2020   Urinary incontinence 05/07/2020   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   Genetic testing 02/27/2018   Family history of genetic disease carrier    Family history of breast cancer    Family history of colon cancer    History of multiple concussions 11/20/2017   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 11/20/2017   Cervical strain, acute, initial encounter 09/19/2016   Insomnia 02/17/2015   Right shoulder pain 12/31/2014   Episodic cluster headache, not intractable 12/02/2014   Chronic paroxysmal hemicrania, 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   Photophobia of both eyes 05/04/2014   Nausea with vomiting 05/04/2014   Bipolar I disorder, most recent episode mixed (HCC) 04/18/2014   Suicidal ideation 04/12/2014   Injury of right shoulder and upper arm 02/17/2014   Migraine with status migrainosus 01/08/2013   Chronic migraine 05/08/2012   Hypertension    Contact dermatitis 11/27/2011   Generalized anxiety disorder 10/27/2011   ADHD (attention deficit hyperactivity disorder), 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 Pt accompanied by: self  PERTINENT HISTORY: [name] is a  53 y.o. female with h/o bipolar, HTN, migraines, anxiety, and four previous concussions presents to the ER 07/15/23 for evaluation after head injury. Around 1800 yesterday, the patient reports that she was bending down when she hit the left side of her head on a purse hook. Denies any LOC. No blood thinner use.  She denies any LOC, syncope, nausea, vomiting.  She is able to work the rest of her shift and took some Tylenol  when she got home.  She reports she woke up this morning with still having some pressure to the left side of her head and around her eyes but no visual changes.  No blurry vision, diplopia, eye pain.  She is concerned that she may have given as of another concussion due to her past medical history of them.  PAIN:  Are you having pain? Yes: NPRS scale: 8/10 Pain location: shoulder left, right shoulder, right knee  Left hand  Pain description: ache Aggravating factors: exercise Relieving  factors: rest, shot  FALLS: Has patient fallen in last 6 months?  See PT evaluation for details  LIVING ENVIRONMENT: Lives with: lives with their family Lives in: House/apartment  PLOF:  Level of assistance: Independent with ADLs, Needed assistance with IADLS Employment: Part-time employment, On disability  PATIENT GOALS: I overwhelmed and I can't find my words  OBJECTIVE:   Note: Objective measures were completed at Evaluation unless otherwise noted.   COGNITION: Overall cognitive status: Impaired Areas of impairment:  Attention: Impaired: Alternating, Divided Memory: Impaired: Auditory Visual Executive function: Impaired: Organization and Slow processing Functional deficits: difficulty processing and recalling more than 1 task at a time at work                                                                                                                             TREATMENT DATE:   12/10/23: Holley noted that she forgot her brace before going to work, which caused her  physical distress. SLP provided [name] with visual aid for optimizing memory for remembering brace for physical wellbeing. [name] also forgot her name tag for work - generated strategy of keeping an extra brace and name tag in her car at all times as back up to prevent injury to her arm. 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 She noted that she experienced word-finding difficulties during work interactions. [name] describes word-finding difficulties as problems with formulating thoughts into words. SLP introduced strategy for thought organization, involving organizing events as past, present, or future to facilitate though formulation for improved work interactions. [name] described difficulties with interactions with her mother due to word-finding issues. SLP challenged [name] to recall and write down disney characters for word-finding, while introducing music to increase cognitive demands. [name] verbalized difficulty with this task. She recalled and wrote >15 disney character names when given occasional mod to min A.   12/05/23: [name] verbalized carryover of using receipt paper and a pen in her pocket to write down multi step instructions at work. Training in compensations for processing, including using mindful breathing and reducing background noise, asking for person to talk slower or repeat what was said, and stress management. 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 [name] required occasional min verbal cues to stay on task while conversing.  12/03/23: [name] has carried over use of planner - she turned the page to the new week yesterday - We generated strategy of writing a note and placing it under a clear mat at her work space and throwing it out after tasks are completed to aid in recall of multiple instructions from her supervisor as well as asking her supervisor to write down more complex  instructions. She relied on co-worker to help her recall complex instructions last week. Targeted complex word finding with given letter and mid to low frequency categories with rare semantic  min  A - she named 21/24 items She has continued to use notebook to keep track of and pay bills on time. Targeted verbal compensations in structured tasks - [name] required occasional min questioning cues and instruction to use most salient descriptions.   11/28/23: [name] went to the wrong place for appointment. This week she has carried over more consistent use of planner - ensuring it is open to the correct week each Sunday. Targeted monthly budget to plan for bills and food/entertainment amounts to plan for funds to pay bills with rare min A she generated list of monthly bills, amounts, due dates and credit cards and estimated amount spend on groceries and eating out with rare min questioning cues. Trained verbal compensations for word finding in structured task generating 3-4 salient descriptions of mid and low frequency objects with rare min questioning cues.   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 [name] continues to report word finding difficulty especially when flustered - we generated strategy of self advocating - letting her supervisor and co-workers know that she is overwhelmed and having trouble finding her words. 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 With rare min A, she named and wrote 15 - with semantic cues, she named 5 more for a total of 20. [name] reports she is using a planner, and keeping it in the same spot and usually remembers to turn to the new week on Sunday, however she forgets to write down appointments occasionally.  Strategy of keeping her planner on top of her piles on her table as visual cues. Moderately complex word finding with given letter required usual mod semantic and fill in the blank cues to name 12 words.   09/18/23: Eval completed - initiated strategies to support processing of instructions at work including self advocating, repeating back what she has heard and asking for directions in writing to refer back to. Initiated training in verbal compensations for word finding generating description and salient words of target word with usual mod questioning cues after initial instruction and modeling.     PATIENT EDUCATION: Education details: verbal compensations for word finding, compensatory strategies to support processing and memory Person educated: Patient Education method: Explanation, Demonstration, and Verbal cues Education comprehension: verbal cues required and needs further education   GOALS: Goals reviewed with patient? Yes  SHORT TERM GOALS: Target date: 11/30/23  Pt will name 18 items in personally relevant category with  Baseline: Goal status: ONGOING  2.  Pt will generate monthly budget to have enough money to pay bills Baseline:  Goal status:MET  3.  Pt will carryover 2 external aids to recall 2-3 tasks at work with rare min A Baseline:  Goal status: MET  4.  Pt will carryover 2 compensatory strategies to support processing speed with rare min A Baseline:  Goal status: ONGOING  5.  Pt will employ verbal compensations for aphasia in structured naming task with occasional min A 5/7 opportunities Baseline:  Goal status: ONGOING  LONG TERM GOALS: Target date: 12/11/23  Pt will complete complex naming tasks with 90% accuracy and occasional min A Baseline:  Goal status: ONGOING  2.  Pt will use external aids to monitor discretionary spending weekly to  maintain her budget for bills Baseline:  Goal status: ONGOING  3.  Pt will generate script to self advocate for  support for processing speech with rare min A Baseline:  Goal status: ONGOING  4.  Pt will use verbal compensations for word finding in conversation as needed with rare min A Baseline:  Goal status: ONGOING  5.  Improve score on PROM Baseline: 24 Goal status: ONGOING ASSESSMENT:  CLINICAL IMPRESSION: Patient is a 53 y.o. female who was seen today for expressive language, slow processing, impaired selective and alternating attention. She reports having difficulty ordering at restaurants due to word finding impairment. She is unable to follow more than 1 direction or task at a time at work and has had her hours reduced. [name] reports difficulty managing her budget due to memory difficulties. She forgets her balance or forgets to save money for bills due later by over spending for food. I recommend skilled ST to maximize cognition for safety, financial management and communication. .   OBJECTIVE IMPAIRMENTS: include attention, memory, executive functioning, and expressive language. These impairments are limiting patient from ADLs/IADLs and effectively communicating at home and in community. Factors affecting potential to achieve goals and functional outcome are co-morbidities.. Patient will benefit from skilled SLP services to address above impairments and improve overall function.  REHAB POTENTIAL: Good  PLAN:  SLP FREQUENCY: 1-2x/week  SLP DURATION: 12 weeks  PLANNED INTERVENTIONS: Language facilitation, Environmental controls, Cueing hierachy, Cognitive reorganization, Internal/external aids, Functional tasks, Multimodal communication approach, SLP instruction and feedback, Compensatory strategies, Patient/family education, [phone] Treatment of speech (30 or 45 min) , and [id number]- Speech Eval Sound Prod, Artic, Phon, Eval Compre, Express    Herta Hink, Leita Caldron, CCC-SLP 12/10/2023, 2:39 PM

## 2023-12-11 DIAGNOSIS — M25511 Pain in right shoulder: Secondary | ICD-10-CM | POA: Diagnosis not present

## 2023-12-11 DIAGNOSIS — M25512 Pain in left shoulder: Secondary | ICD-10-CM | POA: Diagnosis not present

## 2023-12-12 ENCOUNTER — Encounter: Payer: Self-pay | Admitting: Speech Pathology

## 2023-12-12 ENCOUNTER — Ambulatory Visit: Admitting: Speech Pathology

## 2023-12-12 DIAGNOSIS — R41841 Cognitive communication deficit: Secondary | ICD-10-CM

## 2023-12-12 NOTE — Therapy (Signed)
 OUTPATIENT SPEECH LANGUAGE PATHOLOGY TREATMENT & RECERTIFICATION   Patient Name: [name] MRN: [id number] DOB:[birth year], 53 y.o., female Today's Date: 12/12/2023  PCP: None REFERRING PROVIDER: Dohmeier, Dedra, MD  END OF SESSION:  End of Session - 12/12/23 1234     Visit Number 7    Number of Visits 9    Date for Recertification  02/06/24   recert   SLP Start Time 1232    SLP Stop Time  1315    SLP Time Calculation (min) 43 min    Activity Tolerance Patient tolerated treatment well           Past Medical History:  Diagnosis Date   Achilles tendinitis    ADHD (attention deficit hyperactivity disorder), inattentive type    Diagnosed as an adult after starting college   Allergy     Arthritis    Arthritis    Ataxia    Back pain    Bipolar I disorder 04/18/2014   most recent episode mixed   Borderline personality disorder 10/27/2011   Cataracts, bilateral    Chest pain    Chronic pain disorder    due to several injuries affecting numerous areas of her body throughout the years   Chronic paroxysmal hemicrania, 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   rt chest wall nodule removal   GANGLION CYST EXCISION  01/30/2009   lipoma removal     right bunioectomy     SHOULDER SURGERY  01/13/2011   right, partial tear   tumor resection left thigh     Patient Active Problem List   Diagnosis Date Noted   Postconcussion syndrome 10/02/2023   Impaired functional mobility, balance, gait, and endurance 03/08/2023   Unexplained falls 03/08/2023   BPV (benign positional vertigo), right 03/08/2023   Other Specified Feeding or Eating Disorder, Emotional and Binge Eating Behaviors 01/15/2023   Pain in joint of right knee 01/15/2023   Other fatigue 09/24/2022   BMI 37.0-37.9, adult 03/14/2022   Shoulder subluxation, left, initial encounter 02/01/2022   Environmental allergies 01/31/2022   Class 3 severe obesity with serious comorbidity and body mass index (BMI) of 40.0 to 44.9 in adult (HCC) 01/31/2022   Bipolar disorder (HCC) 11/28/2021   Arthritis of first metatarsophalangeal (MTP) joint of right foot 10/24/2021  Migraines 08/18/2021   Cervical radiculopathy 08/16/2021   Lipoma of left forearm 08/16/2021   Carpal tunnel syndrome on left 08/16/2021   Vitamin D  deficiency 07/26/2021   Eating disorder 07/26/2021   Insulin  resistance 07/06/2021   Other hyperlipidemia 07/06/2021   B12 deficiency 07/06/2021   Foot sprain, left, initial encounter 06/01/2021   Mood disorder 04/29/2021   Sleep difficulties 04/29/2021   Other constipation 04/29/2021   Essential hypertension 04/29/2021   Sprain of ankle 03/29/2021   Subacromial bursitis of left shoulder joint 03/29/2021   Concussion with no loss of consciousness 02/07/2021   Lumbar radiculopathy 02/07/2021   Shoulder impingement syndrome, left 11/01/2020   Sprain of  metacarpophalangeal joint of left thumb 10/07/2020   Posterior tibial tendinitis of right lower extremity 09/13/2020   Labral tear of hip, degenerative 09/13/2020   Attention and concentration deficit 05/07/2020   Morbid obesity (HCC) 05/07/2020   Plantar wart 05/07/2020   Sleep apnea 05/07/2020   Urinary incontinence 05/07/2020   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   Genetic testing 02/27/2018   Family history of genetic disease carrier    Family history of breast cancer    Family history of colon cancer    History of multiple concussions 11/20/2017   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 11/20/2017   Cervical strain, acute, initial encounter 09/19/2016   Insomnia 02/17/2015   Right shoulder pain 12/31/2014   Episodic cluster headache, not intractable 12/02/2014   Chronic paroxysmal hemicrania, 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   Photophobia of both eyes 05/04/2014   Nausea with vomiting 05/04/2014   Bipolar I disorder, most recent episode mixed (HCC) 04/18/2014   Suicidal ideation 04/12/2014   Injury of right shoulder and upper arm 02/17/2014   Migraine with status migrainosus 01/08/2013   Chronic migraine 05/08/2012   Hypertension    Contact dermatitis 11/27/2011   Generalized anxiety disorder 10/27/2011   ADHD (attention deficit hyperactivity disorder), 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 Pt accompanied by: self  PERTINENT HISTORY:  [name] is a 53 y.o. female with h/o bipolar, HTN, migraines, anxiety, and four previous concussions presents to the ER 07/15/23 for evaluation after head injury. Around 1800 yesterday, the patient reports that she was bending down when she hit the left side of her head on a purse hook. Denies any LOC. No blood thinner use.  She denies any LOC, syncope, nausea, vomiting.  She is able to work the rest of her shift and took some Tylenol  when she got home.  She reports she woke up this morning with still having some pressure to the left side of her head and around her eyes but no visual changes.  No blurry vision, diplopia, eye pain.  She is concerned that she may have given as of another concussion due to her past medical history of them.  PAIN:  Are you having pain? Yes: NPRS scale: 8/10 Pain location: shoulder left, right shoulder, right knee  Left hand  Pain description: ache  Aggravating factors: exercise Relieving factors: rest, shot  FALLS: Has patient fallen in last 6 months?  See PT evaluation for details  LIVING ENVIRONMENT: Lives with: lives with their family Lives in: House/apartment  PLOF:  Level of assistance: Independent with ADLs, Needed assistance with IADLS Employment: Part-time employment, On disability  PATIENT GOALS: I overwhelmed and I can't find my words  OBJECTIVE:   Note: Objective measures were completed at Evaluation unless otherwise noted.   COGNITION: Overall cognitive status: Impaired Areas of impairment:  Attention: Impaired: Alternating, Divided Memory: Impaired: Auditory Visual Executive function: Impaired: Organization and Slow processing Functional deficits: difficulty processing and recalling more than 1 task at a time at work                                                                                                                             TREATMENT DATE:   12/12/23: [name] reports she utilized strategy of stating what she is  doing in the present to answer her boss' question of where are you in the task. She has not worked since last visit so she has not put the extra brace in her car but she will when she works next. Targeted complex naming  in low frequency category she named 17/20 with occasional min to mod A. Complex naming for 12 categories with given letter [name] required usual mod semantic and visual cues to name 9/12 words.   12/10/23: [name] noted that she forgot her brace before going to work, which caused her physical distress. SLP provided [name] with visual aid for optimizing memory for remembering brace for physical wellbeing. [name] also forgot her name tag for work - generated strategy of keeping an extra brace and name tag in her car at all times as back up to prevent injury to her arm. 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 She noted that she experienced word-finding difficulties during work interactions. [name] describes word-finding difficulties as problems with formulating thoughts into words. SLP introduced strategy for thought organization, involving organizing events as past, present, or future to facilitate though formulation for improved work interactions. [name] described difficulties with interactions with her mother due to word-finding issues. SLP challenged [name] to recall and write down disney characters for word-finding, while introducing music to increase cognitive demands. [name] verbalized difficulty with this task. She recalled and wrote >15 disney character names when given occasional mod to min A.   12/05/23: [name] verbalized carryover of using receipt paper and a pen in her pocket to write down multi step instructions at work. Training in compensations for processing, including using mindful breathing and reducing background noise, asking for person to talk slower or repeat what was said, and stress management. Targeted naming in professional category - fabrics with intermittent conversation as  distraction - she named 16 items in the category with rare min A. [name] required occasional min verbal cues to stay on task while conversing.  12/03/23:  [name] has carried over use of planner - she turned the page to the new week yesterday - We generated strategy of writing a note and placing it under a clear mat at her work space and throwing it out after tasks are completed to aid in recall of multiple instructions from her supervisor as well as asking her supervisor to write down more complex instructions. She relied on co-worker to help her recall complex instructions last week. Targeted complex word finding with given letter and mid to low frequency categories with rare semantic  min A - she named 21/24 items She has continued to use notebook to keep track of and pay bills on time. Targeted verbal compensations in structured tasks - [name] required occasional min questioning cues and instruction to use most salient descriptions.   11/28/23: [name] went to the wrong place for appointment. This week she has carried over more consistent use of planner - ensuring it is open to the correct week each Sunday. Targeted monthly budget to plan for bills and food/entertainment amounts to plan for funds to pay bills with rare min A she generated list of monthly bills, amounts, due dates and credit cards and estimated amount spend on groceries and eating out with rare min questioning cues. Trained verbal compensations for word finding in structured task generating 3-4 salient descriptions of mid and low frequency objects with rare min questioning cues.   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 [name] continues to report word finding difficulty especially when flustered - we generated strategy of self  advocating - letting her supervisor and co-workers know that she is overwhelmed and having trouble finding her words. 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 With rare min A, she named and wrote 15 - with semantic cues, she named 5 more for a total of 20. [name] reports she is using a planner, and keeping it in the same spot and usually remembers to turn to the new week on Sunday, however she forgets to write down appointments occasionally. Strategy of keeping her planner on top of her piles on her table as visual cues. Moderately complex word finding with given letter required usual mod semantic and fill in the blank cues to name 12 words.   09/18/23: Eval completed - initiated strategies to support processing of instructions at work including self advocating, repeating back what she has heard and asking for directions in writing to refer back to. Initiated training in verbal compensations for word finding generating description and salient words of target word with usual mod questioning cues after initial instruction and modeling.     PATIENT EDUCATION: Education details: verbal compensations for word finding, compensatory strategies to support processing and memory Person educated: Patient Education method: Explanation, Demonstration, and Verbal cues Education comprehension: verbal cues required and needs further education   GOALS: Goals reviewed with patient? Yes  SHORT TERM GOALS: Target date: 11/30/23  Pt will name 18 items in personally relevant category with  Baseline: Goal status: MET  2.  Pt will generate monthly budget to have enough money to pay bills Baseline:  Goal status:MET  3.  Pt will carryover 2 external aids to recall 2-3 tasks at work  with rare min A Baseline:  Goal status: MET  4.  Pt will carryover 2 compensatory strategies to support processing speed with rare min A Baseline:  Goal status: ONGOING  5.  Pt  will employ verbal compensations for aphasia in structured naming task with occasional min A 5/7 opportunities Baseline:  Goal status: NOT MET  LONG TERM GOALS: Target date: 02/06/24 (recert)  Pt will complete complex naming tasks with 90% accuracy and occasional min A Baseline:  Goal status: MET  2.  Pt will use external aids to monitor discretionary spending weekly to maintain her budget for bills Baseline:  Goal status: MET  3.  Pt will generate script to self advocate for support for processing speech with rare min A Baseline:  Goal status: ONGOING  4.  Pt will use verbal compensations for word finding in conversation as needed with rare min A Baseline:  Goal status: ONGOING  5.  Improve score on PROM Baseline: 24 Goal status: ONGOING ASSESSMENT:  CLINICAL IMPRESSION: Patient is a 53 y.o. female who was seen today for expressive language, slow processing, impaired selective and alternating attention. She reports having difficulty ordering at restaurants due to word finding impairment. She is unable to follow more than 1 direction or task at a time at work and has had her hours reduced. She has implemented strategies of taking notes at work when given 2-3 tasks or instructions at a time, keeping a spare brace and name tag in the car for work. She is consistently using her weekly planner to manage appointments. She continues to report word finding difficulty at work as well as difficulty communicating thoughts.. I recommend skilled ST to maximize cognition for safety, financial management and communication. Recert completed 12/12/23-02/06/24.   OBJECTIVE IMPAIRMENTS: include attention, memory, executive functioning, and expressive language. These impairments are limiting patient from ADLs/IADLs and effectively communicating at home and in community. Factors affecting potential to achieve goals and functional outcome are co-morbidities.. Patient will benefit from skilled SLP services to  address above impairments and improve overall function.  REHAB POTENTIAL: Good  PLAN:  SLP FREQUENCY: 1-2x/week  SLP DURATION: 12 weeks  PLANNED INTERVENTIONS: Language facilitation, Environmental controls, Cueing hierachy, Cognitive reorganization, Internal/external aids, Functional tasks, Multimodal communication approach, SLP instruction and feedback, Compensatory strategies, Patient/family education, [phone] Treatment of speech (30 or 45 min) , and [id number]- Speech Eval Sound Prod, Artic, Phon, Eval Compre, Express    Tuwanna Krausz, Leita Caldron, CCC-SLP 12/12/2023, 1:24 PM

## 2023-12-17 ENCOUNTER — Ambulatory Visit: Admitting: Speech Pathology

## 2023-12-18 ENCOUNTER — Ambulatory Visit: Admitting: Licensed Clinical Social Worker

## 2023-12-19 ENCOUNTER — Ambulatory Visit: Admitting: Speech Pathology

## 2023-12-19 ENCOUNTER — Encounter: Payer: Self-pay | Admitting: Speech Pathology

## 2023-12-19 DIAGNOSIS — R41841 Cognitive communication deficit: Secondary | ICD-10-CM | POA: Diagnosis not present

## 2023-12-19 NOTE — Therapy (Signed)
 OUTPATIENT SPEECH LANGUAGE PATHOLOGY TREATMENT & RECERTIFICATION   Patient Name: [name] MRN: [id number] DOB:11/14/1970, 53 y.o., female Today's Date: 12/19/2023  PCP: None REFERRING PROVIDER: Dohmeier, Dedra, MD  END OF SESSION:  End of Session - 12/19/23 1236     Visit Number 8    Number of Visits 9    Date for Recertification  02/06/24    SLP Start Time 1232    SLP Stop Time  1315    SLP Time Calculation (min) 43 min    Activity Tolerance Patient tolerated treatment well           Past Medical History:  Diagnosis Date   Achilles tendinitis    ADHD (attention deficit hyperactivity disorder), inattentive type    Diagnosed as an adult after starting college   Allergy     Arthritis    Arthritis    Ataxia    Back pain    Bipolar I disorder 04/18/2014   most recent episode mixed   Borderline personality disorder 10/27/2011   Cataracts, bilateral    Chest pain    Chronic pain disorder    due to several injuries affecting numerous areas of her body throughout the years   Chronic paroxysmal hemicrania, 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   rt chest wall nodule removal   GANGLION CYST EXCISION  01/30/2009   lipoma removal     right bunioectomy     SHOULDER SURGERY  01/13/2011   right, partial tear   tumor resection left thigh     Patient Active Problem List   Diagnosis Date Noted   Postconcussion syndrome 10/02/2023   Impaired functional mobility, balance, gait, and endurance 03/08/2023   Unexplained falls 03/08/2023   BPV (benign positional vertigo), right 03/08/2023   Other Specified Feeding or Eating Disorder, Emotional and Binge Eating Behaviors 01/15/2023   Pain in joint of right knee 01/15/2023   Other fatigue 09/24/2022   BMI 37.0-37.9, adult 03/14/2022   Shoulder subluxation, left, initial encounter 02/01/2022   Environmental allergies 01/31/2022   Class 3 severe obesity with serious comorbidity and body mass index (BMI) of 40.0 to 44.9 in adult (HCC) 01/31/2022   Bipolar disorder (HCC) 11/28/2021   Arthritis of first metatarsophalangeal (MTP) joint of right foot 10/24/2021  Migraines 08/18/2021   Cervical radiculopathy 08/16/2021   Lipoma of left forearm 08/16/2021   Carpal tunnel syndrome on left 08/16/2021   Vitamin D  deficiency 07/26/2021   Eating disorder 07/26/2021   Insulin  resistance 07/06/2021   Other hyperlipidemia 07/06/2021   B12 deficiency 07/06/2021   Foot sprain, left, initial encounter 06/01/2021   Mood disorder 04/29/2021   Sleep difficulties 04/29/2021   Other constipation 04/29/2021   Essential hypertension 04/29/2021   Sprain of ankle 03/29/2021   Subacromial bursitis of left shoulder joint 03/29/2021   Concussion with no loss of consciousness 02/07/2021   Lumbar radiculopathy 02/07/2021   Shoulder impingement syndrome, left 11/01/2020   Sprain of  metacarpophalangeal joint of left thumb 10/07/2020   Posterior tibial tendinitis of right lower extremity 09/13/2020   Labral tear of hip, degenerative 09/13/2020   Attention and concentration deficit 05/07/2020   Morbid obesity (HCC) 05/07/2020   Plantar wart 05/07/2020   Sleep apnea 05/07/2020   Urinary incontinence 05/07/2020   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   Genetic testing 02/27/2018   Family history of genetic disease carrier    Family history of breast cancer    Family history of colon cancer    History of multiple concussions 11/20/2017   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 11/20/2017   Cervical strain, acute, initial encounter 09/19/2016   Insomnia 02/17/2015   Right shoulder pain 12/31/2014   Episodic cluster headache, not intractable 12/02/2014   Chronic paroxysmal hemicrania, 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   Photophobia of both eyes 05/04/2014   Nausea with vomiting 05/04/2014   Bipolar I disorder, most recent episode mixed (HCC) 04/18/2014   Suicidal ideation 04/12/2014   Injury of right shoulder and upper arm 02/17/2014   Migraine with status migrainosus 01/08/2013   Chronic migraine 05/08/2012   Hypertension    Contact dermatitis 11/27/2011   Generalized anxiety disorder 10/27/2011   ADHD (attention deficit hyperactivity disorder), 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 female with h/o bipolar, HTN, migraines, anxiety, and four previous concussions presents to the ER 07/15/23 for evaluation after head injury. Around 1800 yesterday, the patient reports that she was bending down when she hit the left side of her head on a purse hook. Denies any LOC. No blood thinner use.  She denies any LOC, syncope, nausea, vomiting.  She is able to work the rest of her shift and took some Tylenol  when she got home.  She reports she woke up this morning with still having some pressure to the left side of her head and around her eyes but no visual changes.  No blurry vision, diplopia, eye pain.  She is concerned that she may have given as of another concussion due to her past medical history of them.  PAIN:  Are you having pain? Yes: NPRS scale: 8/10 Pain location: shoulder left, right shoulder, right knee  Left hand  Pain description: ache Aggravating factors: exercise Relieving factors: rest, shot  FALLS: Has patient fallen in last 6 months?  See PT evaluation for details  LIVING ENVIRONMENT: Lives with: lives with their family Lives in: House/apartment  PLOF:  Level of assistance: Independent with ADLs, Needed assistance with IADLS Employment: Part-time employment, On disability  PATIENT GOALS: I overwhelmed and I can't find my words  OBJECTIVE:   Note: Objective measures were completed at Evaluation unless otherwise noted.   COGNITION: Overall cognitive status: Impaired Areas of impairment:  Attention: Impaired: Alternating, Divided Memory: Impaired: Auditory Visual Executive function: Impaired: Organization and Slow processing Functional deficits: difficulty processing and recalling more than 1 task at a time at work                                                                                                                             TREATMENT DATE:   12/19/23: [name] did add extra brace and name tag in her car. She  self generated strategy of keeping a notebook by her bed and writing down to do's as she thinks about the next day before she goes to bed. Generated script to explain that she has had 5 concussions and needs extra time to process and communicate - we practiced this in role play with rare min A. Verbal compensations for word finding targeted in structured task with rare min A - in conversation she used compensation 1/1 opportunities.  12/12/23: [name] reports she utilized strategy of stating what she is doing in the present to answer her boss' question of where are you in the task. She has not worked since last visit so she has not put the extra brace in her car but she will when she works next. Targeted complex naming  in low frequency category she named 17/20 with occasional min to mod A. Complex naming for 12 categories with given letter [place] [name] required usual mod semantic and visual cues to name 9/12 words.   12/10/23: [name] noted that she forgot her brace before going to work, which caused her physical distress. SLP provided [name] with visual aid for optimizing memory for remembering brace for physical wellbeing. [name] also forgot her name tag for work - generated strategy of keeping an extra brace and name tag in her car at all times as back up to prevent injury to her arm. 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 She noted that she experienced word-finding difficulties during work interactions. [name] describes word-finding difficulties as problems with formulating thoughts into words. SLP introduced strategy for thought organization, involving organizing events as past, present, or future to facilitate though formulation for improved work interactions. [name] described difficulties with interactions with her mother due to word-finding issues. SLP challenged [name] to recall and write down disney characters for word-finding, while introducing music to increase cognitive demands. [name] verbalized difficulty  with this task. She recalled and wrote >15 disney character names when given  occasional mod to min A.   12/05/23: [name] verbalized carryover of using receipt paper and a pen in her pocket to write down multi step instructions at work. Training in compensations for processing, including using mindful breathing and reducing background noise, asking for person to talk slower or repeat what was said, and stress management. 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 [name] required occasional min verbal cues to stay on task while conversing.  12/03/23: [name] has carried over use of planner - she turned the page to the new week yesterday - We generated strategy of writing a note and placing it under a clear mat at her work space and throwing it out after tasks are completed to aid in recall of multiple instructions from her supervisor as well as asking her supervisor to write down more complex instructions. She relied on co-worker to help her recall complex instructions last week. Targeted complex word finding with given letter and mid to low frequency categories with rare semantic  min A - she named 21/24 items She has continued to use notebook to keep track of and pay bills on time. Targeted verbal compensations in structured tasks - [name] required occasional min questioning cues and instruction to use most salient descriptions.   11/28/23: [name] went to the wrong place for appointment. This week she has carried over more consistent use of planner - ensuring it is open to the correct week each Sunday. Targeted monthly budget to plan for bills and food/entertainment amounts to plan for funds to pay bills with rare min A she generated list of monthly bills, amounts, due dates and credit cards and estimated amount spend on groceries and eating out with rare min questioning cues. Trained verbal compensations for word finding  in structured task generating 3-4 salient descriptions of mid and low frequency objects with rare min questioning cues.   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 [name] continues to report word finding difficulty especially when flustered - we generated strategy of self advocating - letting her supervisor and co-workers know that she is overwhelmed and having trouble finding her words. 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 With rare min A, she named and wrote 15 - with semantic cues, she named 5 more for a total of 20. [name] reports she is using a planner, and keeping it in the same spot and usually remembers to turn to the new week on Sunday, however she forgets to write down appointments occasionally. Strategy of keeping her planner on top of her piles on her table as visual cues. Moderately complex word finding with given letter required usual mod semantic and fill in the blank cues to name 12 words.   09/18/23: Eval completed - initiated strategies to support processing of instructions at work including self advocating, repeating back what she has heard and asking for directions in writing to refer back to. Initiated training in verbal compensations for word finding generating description and salient words of target word with usual mod questioning cues after initial instruction and modeling.     PATIENT EDUCATION: Education details: verbal compensations  for word finding, compensatory strategies to support processing and memory Person educated: Patient Education method: Explanation, Demonstration, and Verbal cues Education comprehension: verbal cues required and needs further education   GOALS: Goals reviewed with  patient? Yes  SHORT TERM GOALS: Target date: 11/30/23  Pt will name 18 items in personally relevant category with  Baseline: Goal status: MET  2.  Pt will generate monthly budget to have enough money to pay bills Baseline:  Goal status:MET  3.  Pt will carryover 2 external aids to recall 2-3 tasks at work with rare min A Baseline:  Goal status: MET  4.  Pt will carryover 2 compensatory strategies to support processing speed with rare min A Baseline:  Goal status: MET  5.  Pt will employ verbal compensations for aphasia in structured naming task with occasional min A 5/7 opportunities Baseline:  Goal status: NOT MET  LONG TERM GOALS: Target date: 02/06/24 (recert)  Pt will complete complex naming tasks with 90% accuracy and occasional min A Baseline:  Goal status: MET  2.  Pt will use external aids to monitor discretionary spending weekly to maintain her budget for bills Baseline:  Goal status: MET  3.  Pt will generate script to self advocate for support for processing speech with rare min A Baseline:  Goal status: ONGOING  4.  Pt will use verbal compensations for word finding in conversation as needed with rare min A Baseline:  Goal status: ONGOING  5.  Improve score on PROM Baseline: 24 Goal status: ONGOING ASSESSMENT:  CLINICAL IMPRESSION: Patient is a 53 y.o. female who was seen today for expressive language, slow processing, impaired selective and alternating attention. She reports having difficulty ordering at restaurants due to word finding impairment. She is unable to follow more than 1 direction or task at a time at work and has had her hours reduced. She has implemented strategies of taking notes at work when given 2-3 tasks or instructions at a time, keeping a spare brace and name tag in the car for work. She is consistently using her weekly planner to manage appointments. She continues to report word finding difficulty at work as well as difficulty  communicating thoughts.. I recommend skilled ST to maximize cognition for safety, financial management and communication. Recert completed 12/12/23-02/06/24.   OBJECTIVE IMPAIRMENTS: include attention, memory, executive functioning, and expressive language. These impairments are limiting patient from ADLs/IADLs and effectively communicating at home and in community. Factors affecting potential to achieve goals and functional outcome are co-morbidities.. Patient will benefit from skilled SLP services to address above impairments and improve overall function.  REHAB POTENTIAL: Good  PLAN:  SLP FREQUENCY: 1-2x/week  SLP DURATION: 12 weeks  PLANNED INTERVENTIONS: Language facilitation, Environmental controls, Cueing hierachy, Cognitive reorganization, Internal/external aids, Functional tasks, Multimodal communication approach, SLP instruction and feedback, Compensatory strategies, Patient/family education, [phone] Treatment of speech (30 or 45 min) , and 07476- Speech Eval Sound Prod, Artic, Phon, Eval Compre, Express    Vear Staton, Leita Caldron, CCC-SLP 12/19/2023, 1:04 PM

## 2023-12-30 NOTE — Progress Notes (Unsigned)
   SUBJECTIVE: Discussed the use of AI scribe software for clinical note transcription with the patient, who gave verbal consent to proceed.  Chief Complaint: Obesity  Interim History: ***  [name] is here to discuss her progress with her obesity treatment plan. She is on the {HWW Weight Loss Plan:210964005} and states she {CHL AMB IS/IS NOT:210130109} following her eating plan approximately *** % of the time. She states she {CHL AMB IS/IS NOT:210130109} exercising *** minutes *** times per week.   OBJECTIVE: Visit Diagnoses: Problem List Items Addressed This Visit     Morbid obesity (HCC)   Insulin  resistance   Other hyperlipidemia   Vitamin D  deficiency   Other fatigue   Other Visit Diagnoses       SOBOE (shortness of breath on exertion)    -  Primary       No data recorded No data recorded No data recorded No data recorded   ASSESSMENT AND PLAN:  Diet: [name] {CHL AMB IS/IS NOT:210130109} currently in the action stage of change. As such, her goal is to {HWW Weight Loss Efforts:210964006}. She {HAS HAS WNU:81165} agreed to {HWW Weight Loss Plan:210964005}.  Exercise: [name] has been instructed {HWW Exercise:210964007} for weight loss and overall health benefits.   Behavior Modification:  We discussed the following Behavioral Modification Strategies today: {HWW Behavior Modification:210964008}. We discussed various medication options to help [name] with her weight loss efforts and we both agreed to ***.  No follow-ups on file.[name] She was informed of the importance of frequent follow up visits to maximize her success with intensive lifestyle modifications for her multiple health conditions.  Attestation Statements:   Reviewed by clinician on day of visit: allergies, medications, problem list, medical history, surgical history, family history, social history, and previous encounter notes.   Time spent on visit including pre-visit chart review and post-visit care and  charting was *** minutes.    [name], PA-C

## 2023-12-31 ENCOUNTER — Ambulatory Visit: Payer: PPO | Admitting: Adult Health

## 2023-12-31 ENCOUNTER — Ambulatory Visit (INDEPENDENT_AMBULATORY_CARE_PROVIDER_SITE_OTHER): Payer: Self-pay | Admitting: Physician Assistant

## 2023-12-31 DIAGNOSIS — E559 Vitamin D deficiency, unspecified: Secondary | ICD-10-CM

## 2023-12-31 DIAGNOSIS — E7849 Other hyperlipidemia: Secondary | ICD-10-CM

## 2023-12-31 DIAGNOSIS — R0602 Shortness of breath: Secondary | ICD-10-CM

## 2023-12-31 DIAGNOSIS — E88819 Insulin resistance, unspecified: Secondary | ICD-10-CM

## 2023-12-31 DIAGNOSIS — R5383 Other fatigue: Secondary | ICD-10-CM

## 2024-01-02 ENCOUNTER — Ambulatory Visit: Payer: Self-pay | Attending: Neurology | Admitting: Speech Pathology

## 2024-01-02 ENCOUNTER — Encounter: Payer: Self-pay | Admitting: Speech Pathology

## 2024-01-02 DIAGNOSIS — F900 Attention-deficit hyperactivity disorder, predominantly inattentive type: Secondary | ICD-10-CM | POA: Diagnosis not present

## 2024-01-02 DIAGNOSIS — R41841 Cognitive communication deficit: Secondary | ICD-10-CM | POA: Insufficient documentation

## 2024-01-02 DIAGNOSIS — F3181 Bipolar II disorder: Secondary | ICD-10-CM | POA: Diagnosis not present

## 2024-01-02 DIAGNOSIS — F603 Borderline personality disorder: Secondary | ICD-10-CM | POA: Diagnosis not present

## 2024-01-02 NOTE — Therapy (Signed)
 OUTPATIENT SPEECH LANGUAGE PATHOLOGY TREATMENT & DISCHARGE SUMMARY   Patient Name: [name] MRN: [id number] DOB:Apr 03, 1970, 53 y.o., female Today's Date: 01/02/2024  PCP: None REFERRING PROVIDER: Dohmeier, Dedra, MD  END OF SESSION:  End of Session - 01/02/24 1411     Visit Number 9    Number of Visits 9    Date for Recertification  02/06/24    SLP Start Time 1400    SLP Stop Time  1445    SLP Time Calculation (min) 45 min    Activity Tolerance Patient tolerated treatment well           Past Medical History:  Diagnosis Date   Achilles tendinitis    ADHD (attention deficit hyperactivity disorder), inattentive type    Diagnosed as an adult after starting college   Allergy     Arthritis    Arthritis    Ataxia    Back pain    Bipolar I disorder 04/18/2014   most recent episode mixed   Borderline personality disorder 10/27/2011   Cataracts, bilateral    Chest pain    Chronic pain disorder    due to several injuries affecting numerous areas of her body throughout the years   Chronic paroxysmal hemicrania, 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   rt chest wall nodule removal   GANGLION CYST EXCISION  01/30/2009   lipoma removal     right bunioectomy     SHOULDER SURGERY  01/13/2011   right, partial tear   tumor resection left thigh     Patient Active Problem List   Diagnosis Date Noted   Postconcussion syndrome 10/02/2023   Impaired functional mobility, balance, gait, and endurance 03/08/2023   Unexplained falls 03/08/2023   BPV (benign positional vertigo), right 03/08/2023   Other Specified Feeding or Eating Disorder, Emotional and Binge Eating Behaviors 01/15/2023   Pain in joint of right knee 01/15/2023   Other fatigue 09/24/2022   BMI 37.0-37.9, adult 03/14/2022   Shoulder subluxation, left, initial encounter 02/01/2022   Environmental allergies 01/31/2022   Class 3 severe obesity with serious comorbidity and body mass index (BMI) of 40.0 to 44.9 in adult (HCC) 01/31/2022   Bipolar disorder (HCC) 11/28/2021   Arthritis of first metatarsophalangeal (MTP) joint of right foot 10/24/2021  Migraines 08/18/2021   Cervical radiculopathy 08/16/2021   Lipoma of left forearm 08/16/2021   Carpal tunnel syndrome on left 08/16/2021   Vitamin D  deficiency 07/26/2021   Eating disorder 07/26/2021   Insulin  resistance 07/06/2021   Other hyperlipidemia 07/06/2021   B12 deficiency 07/06/2021   Foot sprain, left, initial encounter 06/01/2021   Mood disorder 04/29/2021   Sleep difficulties 04/29/2021   Other constipation 04/29/2021   Essential hypertension 04/29/2021   Sprain of ankle 03/29/2021   Subacromial bursitis of left shoulder joint 03/29/2021   Concussion with no loss of consciousness 02/07/2021   Lumbar radiculopathy 02/07/2021   Shoulder impingement syndrome, left 11/01/2020   Sprain of  metacarpophalangeal joint of left thumb 10/07/2020   Posterior tibial tendinitis of right lower extremity 09/13/2020   Labral tear of hip, degenerative 09/13/2020   Attention and concentration deficit 05/07/2020   Morbid obesity (HCC) 05/07/2020   Plantar wart 05/07/2020   Sleep apnea 05/07/2020   Urinary incontinence 05/07/2020   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   Genetic testing 02/27/2018   Family history of genetic disease carrier    Family history of breast cancer    Family history of colon cancer    History of multiple concussions 11/20/2017   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 11/20/2017   Cervical strain, acute, initial encounter 09/19/2016   Insomnia 02/17/2015   Right shoulder pain 12/31/2014   Episodic cluster headache, not intractable 12/02/2014   Chronic paroxysmal hemicrania, 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   Photophobia of both eyes 05/04/2014   Nausea with vomiting 05/04/2014   Bipolar I disorder, most recent episode mixed (HCC) 04/18/2014   Suicidal ideation 04/12/2014   Injury of right shoulder and upper arm 02/17/2014   Migraine with status migrainosus 01/08/2013   Chronic migraine 05/08/2012   Hypertension    Contact dermatitis 11/27/2011   Generalized anxiety disorder 10/27/2011   ADHD (attention deficit hyperactivity disorder), 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 female with h/o bipolar, HTN, migraines, anxiety, and four previous concussions presents to the ER 07/15/23 for evaluation after head injury. Around 1800 yesterday, the patient reports that she was bending down when she hit the left side of her head on a purse hook. Denies any LOC. No blood thinner use.  She denies any LOC, syncope, nausea, vomiting.  She is able to work the rest of her shift and took some Tylenol  when she got home.  She reports she woke up this morning with still having some pressure to the left side of her head and around her eyes but no visual changes.  No blurry vision, diplopia, eye pain.  She is concerned that she may have given as of another concussion due to her past medical history of them.  PAIN:  Are you having pain? Yes: NPRS scale: 8/10 Pain location: shoulder left, right shoulder, right knee  Left hand  Pain description: ache Aggravating factors: exercise Relieving factors: rest, shot  FALLS: Has patient fallen in last 6 months?  See PT evaluation for details  LIVING ENVIRONMENT: Lives with: lives with their family Lives in: House/apartment  PLOF:  Level of assistance: Independent with ADLs, Needed assistance with IADLS Employment: Part-time employment, On disability  PATIENT GOALS: I overwhelmed and I can't find my words  OBJECTIVE:   Note: Objective measures were completed at Evaluation unless otherwise noted.   COGNITION: Overall cognitive status: Impaired Areas of impairment:  Attention: Impaired: Alternating, Divided Memory: Impaired: Auditory Visual Executive function: Impaired: Organization and Slow processing Functional deficits: difficulty processing and recalling more than 1 task at a time at work                                                                                                                             TREATMENT DATE:   01/02/24: Holley reports she carried over self advocating for  slower communication to support her processing - she also reports se carried over telling others I need a minute to respond and get her thought out. She reports she is managing her tasks at work with success. In conversation she used compensations for 2 word finding episodes with rare min A. She improved score on PROM - Cognitive Function Short Form from 24 to 22. Complex word finding tasks completed with 90% accuracy and rare min semantic cues  12/19/23: [name] did add extra brace and name tag in her car. She self generated strategy of keeping a notebook by her bed and writing down to do's as she thinks about the next day before she goes to bed. Generated script to explain that she has had 5 concussions and needs extra time to process and communicate - we practiced this in role play with rare min A. Verbal compensations for word finding targeted in structured task with rare min A - in conversation she used compensation 1/1 opportunities.  12/12/23: [name] reports she utilized strategy of stating what she is doing in the present to answer her boss' question of where are you in the task. She has not worked since last visit so she has not put the extra brace in her car but she will when she works next. Targeted complex naming  in low frequency category she named 17/20 with occasional min to mod A. Complex naming for 12 categories with given letter [place] Holley required usual mod semantic and visual cues to name 9/12 words.   12/10/23: [name] noted that she forgot her brace before going to work, which caused her physical distress. SLP provided [name] with visual aid for optimizing memory for remembering brace for physical wellbeing. [name] also forgot her name tag for work - generated strategy of keeping an extra brace and name tag in her car at all times as back up to prevent injury to her arm. 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 She noted that  she experienced word-finding difficulties during work  interactions. [name] describes word-finding difficulties as problems with formulating thoughts into words. SLP introduced strategy for thought organization, involving organizing events as past, present, or future to facilitate though formulation for improved work interactions. [name] described difficulties with interactions with her mother due to word-finding issues. SLP challenged [name] to recall and write down disney characters for word-finding, while introducing music to increase cognitive demands. [name] verbalized difficulty with this task. She recalled and wrote >15 disney character names when given occasional mod to min A.   12/05/23: [name] verbalized carryover of using receipt paper and a pen in her pocket to write down multi step instructions at work. Training in compensations for processing, including using mindful breathing and reducing background noise, asking for person to talk slower or repeat what was said, and stress management. 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 [name] required occasional min verbal cues to stay on task while conversing.  12/03/23: [name] has carried over use of planner - she turned the page to the new week yesterday - We generated strategy of writing a note and placing it under a clear mat at her work space and throwing it out after tasks are completed to aid in recall of multiple instructions from her supervisor as well as asking her supervisor to write down more complex instructions. She relied on co-worker to help her recall complex instructions last week. Targeted complex word finding with given letter and mid to low frequency categories with rare semantic  min A - she named 21/24 items She has continued to use notebook to keep track of and pay bills on time. Targeted verbal compensations in structured tasks - [name] required occasional min questioning cues and instruction to use  most salient descriptions.   11/28/23: [name] went to the wrong place for appointment. This week she has carried over more consistent use of planner - ensuring it is open to the correct week each Sunday. Targeted monthly budget to plan for bills and food/entertainment amounts to plan for funds to pay bills with rare min A she generated list of monthly bills, amounts, due dates and credit cards and estimated amount spend on groceries and eating out with rare min questioning cues. Trained verbal compensations for word finding in structured task generating 3-4 salient descriptions of mid and low frequency objects with rare min questioning cues.   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 [name] continues to report word finding difficulty especially when flustered - we generated strategy of self advocating - letting her supervisor and co-workers know that she is overwhelmed and having trouble finding her words. 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 With rare min A, she named and wrote 15 - with semantic cues, she named 5 more for a total of 20. [name] reports she is using a planner, and keeping it in the same spot and usually remembers to turn to the new week on Sunday, however she forgets to write down appointments occasionally. Strategy of keeping her planner on top of her piles on her table as visual cues. Moderately complex word  finding with given letter required usual mod semantic and fill in the blank cues to name 12 words.   09/18/23: Eval completed - initiated strategies to support processing of instructions at work including self advocating, repeating back what she has heard and asking for directions in writing to refer back to.  Initiated training in verbal compensations for word finding generating description and salient words of target word with usual mod questioning cues after initial instruction and modeling.     PATIENT EDUCATION: Education details: verbal compensations for word finding, compensatory strategies to support processing and memory Person educated: Patient Education method: Explanation, Demonstration, and Verbal cues Education comprehension: verbal cues required and needs further education   GOALS: Goals reviewed with patient? Yes  SHORT TERM GOALS: Target date: 11/30/23  Pt will name 18 items in personally relevant category with  Baseline: Goal status: MET  2.  Pt will generate monthly budget to have enough money to pay bills Baseline:  Goal status:MET  3.  Pt will carryover 2 external aids to recall 2-3 tasks at work with rare min A Baseline:  Goal status: MET  4.  Pt will carryover 2 compensatory strategies to support processing speed with rare min A Baseline:  Goal status: MET  5.  Pt will employ verbal compensations for aphasia in structured naming task with occasional min A 5/7 opportunities Baseline:  Goal status: NOT MET  LONG TERM GOALS: Target date: 02/06/24 (recert)  Pt will complete complex naming tasks with 90% accuracy and occasional min A Baseline:  Goal status: MET  2.  Pt will use external aids to monitor discretionary spending weekly to maintain her budget for bills Baseline:  Goal status: MET  3.  Pt will generate script to self advocate for support for processing speech with rare min A Baseline:  Goal status: MET  4.  Pt will use verbal compensations for word finding in conversation as needed with rare min A Baseline:  Goal status: MET  5.  Improve score on PROM Baseline: 24 - post 22 Goal status: MET ASSESSMENT:  CLINICAL IMPRESSION: Patient is a 53 y.o. female who was seen today for expressive language, slow processing, impaired selective and  alternating attention. She reports having difficulty ordering at restaurants due to word finding impairment. She is unable to follow more than 1 direction or task at a time at work and has had her hours reduced. She has implemented strategies of taking notes at work when given 2-3 tasks or instructions at a time, keeping a spare brace and name tag in the car for work. She is consistently using her weekly planner to manage appointments. [name] has carried over self advocating when she needs extra time to process conversation.  Goals met, d/c ST.   OBJECTIVE IMPAIRMENTS: include attention, memory, executive functioning, and expressive language. These impairments are limiting patient from ADLs/IADLs and effectively communicating at home and in community. Factors affecting potential to achieve goals and functional outcome are co-morbidities.. Patient will benefit from skilled SLP services to address above impairments and improve overall function.  REHAB POTENTIAL: Good  PLAN:  SLP FREQUENCY: 1-2x/week  SLP DURATION: 12 weeks  PLANNED INTERVENTIONS: Language facilitation, Environmental controls, Cueing hierachy, Cognitive reorganization, Internal/external aids, Functional tasks, Multimodal communication approach, SLP instruction and feedback, Compensatory strategies, Patient/family education, [phone] Treatment of speech (30 or 45 min) , and [id number]- Speech Eval Sound Prod, Artic, Phon, Eval Compre, Express  SPEECH THERAPY DISCHARGE SUMMARY  Visits from Start of Care: 9  Current  functional level related to goals / functional outcomes: See goals above   Remaining deficits:  Attention, processing   Education / Equipment: Compensations for cognitive impairment   Patient agrees to discharge. Patient goals were met. Patient is being discharged due to meeting the stated rehab goals..     Blenda Wisecup Ann, CCC-SLP 01/02/2024, 3:02 PM

## 2024-01-03 DIAGNOSIS — M25511 Pain in right shoulder: Secondary | ICD-10-CM | POA: Diagnosis not present

## 2024-01-08 DIAGNOSIS — S60212D Contusion of left wrist, subsequent encounter: Secondary | ICD-10-CM | POA: Diagnosis not present

## 2024-01-08 DIAGNOSIS — M25532 Pain in left wrist: Secondary | ICD-10-CM | POA: Diagnosis not present

## 2024-01-08 DIAGNOSIS — M1812 Unilateral primary osteoarthritis of first carpometacarpal joint, left hand: Secondary | ICD-10-CM | POA: Diagnosis not present

## 2024-01-09 ENCOUNTER — Other Ambulatory Visit: Payer: Self-pay | Admitting: Neurology

## 2024-01-09 NOTE — Progress Notes (Signed)
Never seen pt

## 2024-01-13 ENCOUNTER — Other Ambulatory Visit: Payer: Self-pay | Admitting: Neurology

## 2024-01-21 ENCOUNTER — Ambulatory Visit: Admitting: Neurology

## 2024-02-06 ENCOUNTER — Encounter (INDEPENDENT_AMBULATORY_CARE_PROVIDER_SITE_OTHER): Payer: Self-pay | Admitting: Family Medicine

## 2024-02-06 ENCOUNTER — Ambulatory Visit (INDEPENDENT_AMBULATORY_CARE_PROVIDER_SITE_OTHER): Admitting: Family Medicine

## 2024-02-06 DIAGNOSIS — E669 Obesity, unspecified: Secondary | ICD-10-CM

## 2024-02-06 DIAGNOSIS — Z6841 Body Mass Index (BMI) 40.0 and over, adult: Secondary | ICD-10-CM

## 2024-02-06 DIAGNOSIS — E88819 Insulin resistance, unspecified: Secondary | ICD-10-CM

## 2024-02-06 DIAGNOSIS — I10 Essential (primary) hypertension: Secondary | ICD-10-CM

## 2024-02-06 DIAGNOSIS — E559 Vitamin D deficiency, unspecified: Secondary | ICD-10-CM

## 2024-02-06 DIAGNOSIS — Z6838 Body mass index (BMI) 38.0-38.9, adult: Secondary | ICD-10-CM

## 2024-02-06 DIAGNOSIS — M25561 Pain in right knee: Secondary | ICD-10-CM | POA: Diagnosis not present

## 2024-02-06 NOTE — Progress Notes (Signed)
 "  [name], D.O.  ABFM, ABOM Specializing in Clinical Bariatric Medicine  Office located at: [address] [place], [place]  [address]      A) FOR THE CHRONIC DISEASE OF OBESITY:  Obesity (HCC)-start bmi 39.87 BMI 38.0-38.9,adult Current BMI 43.27  Chief complaint: Obesity [name] is here to discuss her progress with her obesity treatment plan.   History of present illness / Interval history:  [name] is here today for her follow-up office visit.  Since last OV on 11/26/2023, pt weight is unchanged because they didn't subtract a pound for a brace.   Tries to get 25 grams of protein.   11/26/23 14:00 02/06/24 09:00   Body Fat % 49.7 % 49.6 %  Muscle Mass (lbs) 123.6 lbs 124.6 lbs  Fat Mass (lbs) 128.8 lbs 129.2 lbs  Total Body Water (lbs) 91.4 lbs 92.8 lbs  Visceral Fat Rating  14 14  Counseling done on how various foods will affect these numbers and how to maximize success   Total lbs lost to date: -11 lbs Total Fat Mass in lbs lost to date: -8.8 Total weight loss percentage to date: -4.07 %   Nutrition Therapy She is on the Category 2 Plan and states she is following her eating plan approximately 50 % of the time.   - Tracking Calories/Macros: no  - Eating More Whole Foods: yes  - Adequate Protein Intake: no  - Adequate Water Intake: no  - Skipping Meals: no  - Sleeping 7-9 Hours/ Night: yes   [name] is currently in the action stage of change. As such, her goal is to continue weight management plan.  She has agreed to: continue current plan   Physical Activity Pt is not exercising.   [name] has been advised to work up to 300-450 minutes of moderate intensity aerobic activity a week and strengthening exercises 2-3 times per week for cardiovascular health, weight loss maintenance and preservation of muscle mass.  She has agreed to : Think about enjoyable ways to increase daily physical activity and overcoming barriers to exercise and  Increase physical activity in their day and reduce sedentary time (increase NEAT).   Behavioral Modifications Evidence-based interventions for health behavior change were utilized today including the discussion of  1) self monitoring techniques:  meal prepping and planning 2) problem-solving barriers:  soda cravings, knee injury 3) self care:  exercise 4) SMART goals for next OV:  Walk 10 mins daily and meal prep two days a week. Regarding patient's less desirable eating habits and patterns, we employed the technique of small changes.   We discussed the following today: increasing lean protein intake to established goals, avoiding skipping meals, work on meal planning and preparation, practice mindfulness eating and understand the difference between hunger signals and cravings, continue to work on implementation of reduced calorie nutritional plan, and continue to practice mindfulness when eating Additional resources provided today: None   Medical Interventions/ Pharmacotherapy Previous Bariatric surgery: none Pharmacotherapy for weight loss: She is not currently taking medications  for medical weight loss.    We discussed various medication options to help [name] with her weight loss efforts and we both agreed to : Continue with current nutritional and behavioral strategies   B) OBESITY RELATED CONDITIONS ADDRESSED TODAY:   Obtain fasting labs at next OV  Insulin  resistance Assessment & Plan Lab Results  Component Value Date   HGBA1C 5.1 09/21/2023   HGBA1C 5.3 03/06/2023   HGBA1C 5.3 03/14/2022  INSULIN  5.9 09/21/2023   INSULIN  7.3 03/06/2023   INSULIN  7.0 03/14/2022  Managed by dietary and lifestyle interventions. Hunger and cravings are well controlled. A1c is well controlled. No acute concerns. Cont to decrease simple carbs, increase lean proteins and exercise to promote weight loss and prevent progression to T2DM.     Essential hypertension Assessment & Plan Last 3  blood pressure readings in our office are as follows: BP Readings from Last 3 Encounters:  02/06/24 116/76  11/26/23 111/62  10/23/23 136/81   The 10-year ASCVD risk score (Arnett DK, et al., 2019) is: 2.2%  Lab Results  Component Value Date   CREATININE 0.94 09/21/2023  Currently on Toprol -XL 100 mg daily with good compliance and tolerance. BP is well controlled today. Pt asx. No acute concerns. Cont adherence to medication. Cont low-sodium, heart-healthy diet. Increase water intake.     Pain in joint of right knee Assessment & Plan Degenerative meniscus tear and knee osteoarthritis exacerbated by recent fall. MRI confirmed degenerative changes. Reports the pain has made it difficult to exercise. Because she is unable to exercise regularly, I encouraged her meal prep and focus on her dietary habits. Focus on whole goods, low carb, high protein foods. Cont to work with PT.     Vitamin D  deficiency Assessment & Plan Lab Results  Component Value Date   VD25OH 51.4 09/21/2023   VD25OH 53.2 03/06/2023   VD25OH 66.8 12/20/2021  Currently on OTC Vitamin D3 5000 international units daily. Vit D levels are at goal. No acute concerns. Cont regimen. Will recheck levels as deemed clinically necessary.     Follow up:   Return 03/03/2024 10:20 AM.  She was informed of the importance of frequent follow up visits to maximize her success with intensive lifestyle modifications for her multiple health conditions.   Weight Summary and Biometrics   Weight Lost Since Last Visit: 0lb  Weight Gained Since Last Visit: 1lb    Vitals Temp: (!) 97.5 F (36.4 C) BP: 116/76 Pulse Rate: 65 SpO2: 98 %   Anthropometric Measurements Height: 5' 5 (1.651 m) Weight: 260 lb (117.9 kg) BMI (Calculated): 43.27 Weight at Last Visit: 162lb Weight Lost Since Last Visit: 0lb Weight Gained Since Last Visit: 1lb Starting Weight: 270lb Total Weight Loss (lbs): 10 lb (4.536 kg)   Body Composition   Body Fat %: 49.6 % Fat Mass (lbs): 129.2 lbs Muscle Mass (lbs): 124.6 lbs Total Body Water (lbs): 92.8 lbs Visceral Fat Rating : 14   Other Clinical Data Fasting: no Labs: Yes Today's Visit #: 12 Starting Date: 02/15/21    Objective:   PHYSICAL EXAM: Blood pressure 116/76, pulse 65, temperature (!) 97.5 F (36.4 C), height 5' 5 (1.651 m), weight 260 lb (117.9 kg), SpO2 98%. Body mass index is 43.27 kg/m.  General: she is overweight, cooperative and in no acute distress. PSYCH: Has normal mood, affect and thought process.   HEENT: EOMI, sclerae are anicteric. Lungs: Normal breathing effort, no conversational dyspnea. Extremities: Moves * 4 Neurologic: A and O * 3, good insight  DIAGNOSTIC DATA REVIEWED: BMET    Component Value Date/Time   NA 139 09/21/2023 1050   K 4.0 09/21/2023 1050   CL 102 09/21/2023 1050   CO2 22 09/21/2023 1050   GLUCOSE 88 09/21/2023 1050   GLUCOSE 101 (H) 08/17/2021 1526   BUN 7 09/21/2023 1050   CREATININE 0.94 09/21/2023 1050   CREATININE 0.83 09/11/2011 1524   CALCIUM  9.5 09/21/2023 1050   GFRNONAA >  60 08/18/2021 1755   GFRAA 69 01/15/2020 0948   Lab Results  Component Value Date   HGBA1C 5.1 09/21/2023   HGBA1C 5.3 07/11/2007   Lab Results  Component Value Date   INSULIN  5.9 09/21/2023   INSULIN  9.3 02/15/2021   Lab Results  Component Value Date   TSH 2.530 03/06/2023   CBC    Component Value Date/Time   WBC 6.0 03/06/2023 1234   WBC 7.3 08/18/2021 1755   RBC 4.87 03/06/2023 1234   RBC 4.40 08/18/2021 1755   HGB 14.8 03/06/2023 1234   HCT 44.6 03/06/2023 1234   PLT 251 03/06/2023 1234   MCV 92 03/06/2023 1234   MCH 30.4 03/06/2023 1234   MCH 30.7 08/18/2021 1755   MCHC 33.2 03/06/2023 1234   MCHC 32.8 08/18/2021 1755   RDW 12.4 03/06/2023 1234   Iron Studies    Component Value Date/Time   IRON 111 07/19/2020 1004   TIBC 286 07/19/2020 1004   FERRITIN 63 07/19/2020 1004   IRONPCTSAT 39 07/19/2020 1004    Lipid Panel     Component Value Date/Time   CHOL 204 (H) 09/21/2023 1050   TRIG 100 09/21/2023 1050   HDL 50 09/21/2023 1050   CHOLHDL 3.4 07/06/2021 1129   CHOLHDL 3.7 12/29/2016 0627   VLDL 27 12/29/2016 0627   LDLCALC 136 (H) 09/21/2023 1050   Hepatic Function Panel     Component Value Date/Time   PROT 5.8 (L) 09/21/2023 1050   ALBUMIN 4.1 09/21/2023 1050   AST 15 09/21/2023 1050   ALT 10 09/21/2023 1050   ALKPHOS 86 09/21/2023 1050   BILITOT 0.4 09/21/2023 1050   BILIDIR 0.1 04/22/2014 0630   IBILI 0.3 04/22/2014 0630      Component Value Date/Time   TSH 2.530 03/06/2023 1234   Nutritional Lab Results  Component Value Date   VD25OH 51.4 09/21/2023   VD25OH 53.2 03/06/2023   VD25OH 66.8 12/20/2021    Attestations:   I, Feliciano Mingle, acting as a stage manager for [employer], DO., have compiled all relevant documentation for today's office visit on behalf of Joyce Jenkins, DO, while in the presence of [employer], DO.  I have spent 40 minutes in the care of the patient today including 30 minutes face-to-face assessing and reviewing listed medical problems above as outlined in office visit note and providing nutritional and behavioral counseling as outlined in obesity care plan.   I have reviewed the above documentation for accuracy and completeness, and I agree with the above. [name], D.O.  The 21st Century Cures Act was signed into law in 2016 which includes the topic of electronic health records.  This provides immediate access to information in MyChart.  This includes consultation notes, operative notes, office notes, lab results and pathology reports.  If you have any questions about what you read please let us  know at your next visit so we can discuss your concerns and take corrective action if need be.  We are right here with you.  "

## 2024-03-03 ENCOUNTER — Ambulatory Visit (INDEPENDENT_AMBULATORY_CARE_PROVIDER_SITE_OTHER): Admitting: Family Medicine

## 2024-03-05 ENCOUNTER — Ambulatory Visit (INDEPENDENT_AMBULATORY_CARE_PROVIDER_SITE_OTHER): Admitting: Family Medicine

## 2024-03-26 ENCOUNTER — Ambulatory Visit (INDEPENDENT_AMBULATORY_CARE_PROVIDER_SITE_OTHER): Admitting: Family Medicine

## 2024-03-31 ENCOUNTER — Ambulatory Visit: Admitting: Neurology

## 2024-04-03 ENCOUNTER — Ambulatory Visit: Admitting: Neurology
